# Patient Record
Sex: Male | Born: 1955
Health system: Southern US, Community
[De-identification: ages and names within clinical notes are randomized; demographics above are authoritative.]

## PROBLEM LIST (undated history)

## (undated) DIAGNOSIS — I080 Rheumatic disorders of both mitral and aortic valves: Secondary | ICD-10-CM

## (undated) DIAGNOSIS — M1990 Unspecified osteoarthritis, unspecified site: Secondary | ICD-10-CM

## (undated) DIAGNOSIS — K2970 Gastritis, unspecified, without bleeding: Secondary | ICD-10-CM

## (undated) DIAGNOSIS — I350 Nonrheumatic aortic (valve) stenosis: Secondary | ICD-10-CM

## (undated) DIAGNOSIS — I739 Peripheral vascular disease, unspecified: Secondary | ICD-10-CM

## (undated) DIAGNOSIS — T8189XA Other complications of procedures, not elsewhere classified, initial encounter: Secondary | ICD-10-CM

## (undated) DIAGNOSIS — T7840XA Allergy, unspecified, initial encounter: Secondary | ICD-10-CM

## (undated) DIAGNOSIS — G4730 Sleep apnea, unspecified: Secondary | ICD-10-CM

## (undated) DIAGNOSIS — I639 Cerebral infarction, unspecified: Secondary | ICD-10-CM

## (undated) DIAGNOSIS — F17200 Nicotine dependence, unspecified, uncomplicated: Secondary | ICD-10-CM

## (undated) DIAGNOSIS — K2980 Duodenitis without bleeding: Secondary | ICD-10-CM

## (undated) DIAGNOSIS — L409 Psoriasis, unspecified: Secondary | ICD-10-CM

## (undated) DIAGNOSIS — K219 Gastro-esophageal reflux disease without esophagitis: Secondary | ICD-10-CM

## (undated) DIAGNOSIS — F419 Anxiety disorder, unspecified: Secondary | ICD-10-CM

## (undated) DIAGNOSIS — N19 Unspecified kidney failure: Secondary | ICD-10-CM

## (undated) DIAGNOSIS — N289 Disorder of kidney and ureter, unspecified: Secondary | ICD-10-CM

## (undated) DIAGNOSIS — I5030 Unspecified diastolic (congestive) heart failure: Secondary | ICD-10-CM

## (undated) DIAGNOSIS — G629 Polyneuropathy, unspecified: Secondary | ICD-10-CM

## (undated) DIAGNOSIS — R0602 Shortness of breath: Secondary | ICD-10-CM

## (undated) DIAGNOSIS — J45909 Unspecified asthma, uncomplicated: Secondary | ICD-10-CM

## (undated) DIAGNOSIS — F32A Depression, unspecified: Secondary | ICD-10-CM

## (undated) DIAGNOSIS — H269 Unspecified cataract: Secondary | ICD-10-CM

## (undated) DIAGNOSIS — D649 Anemia, unspecified: Secondary | ICD-10-CM

## (undated) DIAGNOSIS — L4050 Arthropathic psoriasis, unspecified: Secondary | ICD-10-CM

## (undated) DIAGNOSIS — D369 Benign neoplasm, unspecified site: Secondary | ICD-10-CM

## (undated) DIAGNOSIS — I2510 Atherosclerotic heart disease of native coronary artery without angina pectoris: Secondary | ICD-10-CM

## (undated) DIAGNOSIS — R011 Cardiac murmur, unspecified: Secondary | ICD-10-CM

## (undated) DIAGNOSIS — E669 Obesity, unspecified: Secondary | ICD-10-CM

## (undated) DIAGNOSIS — B9681 Helicobacter pylori [H. pylori] as the cause of diseases classified elsewhere: Secondary | ICD-10-CM

## (undated) DIAGNOSIS — D126 Benign neoplasm of colon, unspecified: Secondary | ICD-10-CM

## (undated) DIAGNOSIS — N186 End stage renal disease: Secondary | ICD-10-CM

## (undated) DIAGNOSIS — R3912 Poor urinary stream: Secondary | ICD-10-CM

## (undated) DIAGNOSIS — R0600 Dyspnea, unspecified: Secondary | ICD-10-CM

## (undated) DIAGNOSIS — N184 Chronic kidney disease, stage 4 (severe): Secondary | ICD-10-CM

## (undated) DIAGNOSIS — I6529 Occlusion and stenosis of unspecified carotid artery: Secondary | ICD-10-CM

## (undated) DIAGNOSIS — F329 Major depressive disorder, single episode, unspecified: Secondary | ICD-10-CM

## (undated) DIAGNOSIS — I10 Essential (primary) hypertension: Secondary | ICD-10-CM

## (undated) DIAGNOSIS — Z951 Presence of aortocoronary bypass graft: Secondary | ICD-10-CM

## (undated) DIAGNOSIS — K319 Disease of stomach and duodenum, unspecified: Secondary | ICD-10-CM

## (undated) DIAGNOSIS — K5790 Diverticulosis of intestine, part unspecified, without perforation or abscess without bleeding: Secondary | ICD-10-CM

## (undated) DIAGNOSIS — I82409 Acute embolism and thrombosis of unspecified deep veins of unspecified lower extremity: Secondary | ICD-10-CM

## (undated) DIAGNOSIS — E785 Hyperlipidemia, unspecified: Secondary | ICD-10-CM

## (undated) DIAGNOSIS — I208 Other forms of angina pectoris: Secondary | ICD-10-CM

## (undated) DIAGNOSIS — G510 Bell's palsy: Secondary | ICD-10-CM

## (undated) HISTORY — DX: Unspecified cataract: H26.9

## (undated) HISTORY — PX: VASCULAR SURGERY: SHX849

## (undated) HISTORY — DX: Shortness of breath: R06.02

## (undated) HISTORY — DX: Cerebral infarction, unspecified: I63.9

## (undated) HISTORY — DX: Diverticulosis of intestine, part unspecified, without perforation or abscess without bleeding: K57.90

## (undated) HISTORY — PX: CORONARY ANGIOPLASTY: SHX604

## (undated) HISTORY — DX: Other forms of angina pectoris: I20.8

## (undated) HISTORY — DX: Hyperlipidemia, unspecified: E78.5

## (undated) HISTORY — DX: Obesity, unspecified: E66.9

## (undated) HISTORY — DX: Atherosclerotic heart disease of native coronary artery without angina pectoris: I25.10

## (undated) HISTORY — DX: Bell's palsy: G51.0

## (undated) HISTORY — DX: Depression, unspecified: F32.A

## (undated) HISTORY — DX: Polyneuropathy, unspecified: G62.9

## (undated) HISTORY — DX: Other complications of procedures, not elsewhere classified, initial encounter: T81.89XA

## (undated) HISTORY — DX: Cardiac murmur, unspecified: R01.1

## (undated) HISTORY — DX: Essential (primary) hypertension: I10

## (undated) HISTORY — DX: Occlusion and stenosis of unspecified carotid artery: I65.29

## (undated) HISTORY — DX: Nicotine dependence, unspecified, uncomplicated: F17.200

## (undated) HISTORY — DX: Disease of stomach and duodenum, unspecified: K31.9

## (undated) HISTORY — DX: Poor urinary stream: R39.12

## (undated) HISTORY — DX: Gastritis, unspecified, without bleeding: K29.70

## (undated) HISTORY — DX: Unspecified osteoarthritis, unspecified site: M19.90

## (undated) HISTORY — DX: Benign neoplasm of colon, unspecified: D12.6

## (undated) HISTORY — DX: Psoriasis, unspecified: L40.9

## (undated) HISTORY — DX: Major depressive disorder, single episode, unspecified: F32.9

## (undated) HISTORY — DX: Nonrheumatic aortic (valve) stenosis: I35.0

## (undated) HISTORY — DX: Arthropathic psoriasis, unspecified: L40.50

## (undated) HISTORY — PX: SPLENECTOMY: SUR1306

## (undated) HISTORY — DX: Helicobacter pylori (H. pylori) as the cause of diseases classified elsewhere: B96.81

## (undated) HISTORY — DX: Acute embolism and thrombosis of unspecified deep veins of unspecified lower extremity: I82.409

## (undated) HISTORY — DX: Peripheral vascular disease, unspecified: I73.9

## (undated) HISTORY — DX: Anxiety disorder, unspecified: F41.9

## (undated) HISTORY — DX: Allergy, unspecified, initial encounter: T78.40XA

## (undated) HISTORY — DX: Duodenitis without bleeding: K29.80

## (undated) HISTORY — DX: Benign neoplasm, unspecified site: D36.9

## (undated) HISTORY — DX: Sleep apnea, unspecified: G47.30

## (undated) HISTORY — PX: COLONOSCOPY: SHX174

---

## 2005-07-23 DIAGNOSIS — G510 Bell's palsy: Secondary | ICD-10-CM

## 2005-07-23 HISTORY — DX: Bell's palsy: G51.0

## 2009-04-14 ENCOUNTER — Emergency Department: Payer: Self-pay | Admitting: Emergency Medicine

## 2009-04-19 ENCOUNTER — Ambulatory Visit: Payer: Self-pay | Admitting: Pain Medicine

## 2009-04-27 ENCOUNTER — Ambulatory Visit: Payer: Self-pay | Admitting: Pain Medicine

## 2009-05-30 ENCOUNTER — Ambulatory Visit: Payer: Self-pay | Admitting: Gastroenterology

## 2009-06-02 ENCOUNTER — Ambulatory Visit: Payer: Self-pay | Admitting: Pain Medicine

## 2009-06-08 ENCOUNTER — Ambulatory Visit: Payer: Self-pay | Admitting: Pain Medicine

## 2009-07-07 ENCOUNTER — Inpatient Hospital Stay: Payer: Self-pay | Admitting: Vascular Surgery

## 2009-07-23 HISTORY — PX: OTHER SURGICAL HISTORY: SHX169

## 2009-08-16 ENCOUNTER — Inpatient Hospital Stay: Payer: Self-pay | Admitting: Cardiovascular Disease

## 2009-09-15 ENCOUNTER — Encounter: Payer: Self-pay | Admitting: Cardiovascular Disease

## 2009-09-20 ENCOUNTER — Encounter: Payer: Self-pay | Admitting: Cardiovascular Disease

## 2009-10-21 ENCOUNTER — Encounter: Payer: Self-pay | Admitting: Cardiovascular Disease

## 2010-01-11 ENCOUNTER — Emergency Department: Payer: Self-pay | Admitting: Emergency Medicine

## 2010-02-28 ENCOUNTER — Emergency Department: Payer: Self-pay | Admitting: Emergency Medicine

## 2010-03-28 ENCOUNTER — Emergency Department: Payer: Self-pay | Admitting: Emergency Medicine

## 2010-04-27 ENCOUNTER — Ambulatory Visit: Payer: Self-pay | Admitting: Vascular Surgery

## 2010-08-11 ENCOUNTER — Ambulatory Visit
Admission: RE | Admit: 2010-08-11 | Discharge: 2010-08-11 | Payer: Self-pay | Source: Home / Self Care | Attending: Cardiovascular Disease | Admitting: Cardiovascular Disease

## 2010-08-12 NOTE — Assessment & Plan Note (Signed)
[facility] OFFICE NOTE  NAME:[name], [name]                       MRN:          [id number] DATE:08/11/2010                            DOB:          1956-06-07   PRIMARY CARE PHYSICIAN:  Dr. Ronette Deter in [place].  CHIEF COMPLAINT:  Generalized fatigue and dyspnea with occasional chest pain.  HISTORY OF PRESENT ILLNESS:  [name] is a 55 year old gentleman who is one of my patients from my previous practice.  He has extensive medical problems that include, 1. Coronary artery disease.  Cardiac catheterization was done in     January 2011 through the left femoral artery.  It showed 95%     diffuse calcified proximal LAD disease, 50% circumflex disease, and     60% ostial RCA stenosis.  Ejection fraction was normal.  He     underwent angioplasty and 3 overlapping drug-eluting stent     placements to the proximal LAD with 2.5 x 18 mm, 2.75 x 18 mm, and     a 3.0 x 18 mm Xience drug-eluting stents. 2. Peripheral vascular disease status post multiple percutaneous     intervention on the iliac, SFA as well as below the knee done by     Dr. Ronalee Belts. 3. Type 2 diabetes. 4. Mild aortic stenosis. 5. Hyperlipidemia. 6. Hypertension. 7. Tobacco use, recently quit. 8. Depression. 9. Psoriasis. 10.Neuropathy. 11.Obesity. 12.Status post splenectomy.  [name] is here today for a followup visit.  Overall, he has been feeling very tired with no energy.  He is not able to do any regular exercise due to fatigue and also discomfort in his lower extremities as well as claudication.  He continues to have occasional chest tightness most of the time with certain activities but has not had any in the last few weeks.  He gets dyspnea with minimal activities and has not been able to exercise regularly.  MEDICATIONS: 1. Aspirin 325 mg once daily. 2. Cymbalta 30 mg once daily. 3. Diazepam 2 mg three times  daily. 4. Fentanyl patch 50 mcg every 3 days. 5. Insulin Lantus 50 units once daily. 6. Lipitor 40 mg once daily. 7. Metoprolol tartrate 25 mg twice daily. 8. Omega three fish oil once daily. 9. Oxycodone twice daily. 10.Plavix 75 mg once daily. 11.Pristiq 50 mg once daily. 12.Vitamin D3.  ALLERGIES:  PENICILLIN.  SOCIAL HISTORY:  He quit smoking 2 weeks ago.  He used to smoke half a pack to one pack a day for 30 years.  He denies any alcohol use.  There has been no history of recreational drug use except in the very remote past.  The patient is married and retired.  FAMILY HISTORY:  Negative for premature coronary artery disease.  PHYSICAL EXAMINATION:  GENERAL:  The patient appears to be at his stated age and in no acute distress. VITAL SIGNS:  Weight is 261 pounds, blood pressure is 147/89, pulse is 89, oxygen saturation is 98% on room air. HEENT:  Normocephalic, atraumatic. NECK:  No JVD or carotid bruits. RESPIRATORY:  Normal respiratory effort with no use of accessory muscles.  Auscultation  reveals normal breath sounds. CARDIOVASCULAR:  Normal PMI.  Normal S1 and S2 with no gallops.  There is a 2/6 systolic ejection murmur at the aortic area which is early peaking. ABDOMEN:  Benign, nontender, nondistended. EXTREMITIES:  No clubbing or cyanosis.  There is trace edema bilaterally. SKIN:  He has skin lesions on both arms and the elbow area consistent with psoriasis. PSYCHIATRIC:  He is alert, oriented x3 with normal mood and affect. MUSCULOSKELETAL:  He has normal muscle strength in the upper and lower extremities.  An electrocardiogram was performed which showed normal sinus rhythm with no significant ST or T-wave changes.  IMPRESSION: 1. Coronary artery disease.  He is having symptoms of generalized     fatigue and increased dyspnea with minimal activities.  He also has     occasional chest tightness with activities.  His cardiac     catheterization last year  showed moderate stenosis in the right     coronary artery as well as the left circumflex artery.  He did have     angioplasty and three drug-eluting stent placement to the proximal     and ostial LAD.  Due to his current symptoms and previous cardiac     history, I recommend proceeding with a Lexiscan nuclear stress test     to evaluate for possible underlying ischemia.  The patient is not     able to exercise on the treadmill due to lower extremity     claudication, neuropathy, and back pain.  In the meantime, I asked     him to decrease aspirin to 81 mg once daily.  We will continue with     Plavix 75 mg once daily, likely indefinitely if tolerated.  We will     continue with management of his risk factors. 2. Hypertension:  Blood pressure is mildly elevated.  We will consider     adding an ACE inhibitor given that he is diabetic. 3. Hyperlipidemia being treated with Lipitor and fish oil.  I do not     have the results of his recent lipid profile.  Target LDL should be     less than 70. 4. Peripheral arterial disease:  This is being followed and managed by     Dr. Ronalee Belts.  I discussed with the patient the importance of     complete smoking cessation and not going back to it.  He has not     smoked in the last 2 weeks and hopefully, he will be able to quit     completely.  The patient's followup will be decided based on the     results of the stress test.  If the stress test shows evidence of     ischemia, most likely, we will need to proceed with the cardiac     catheterization given his prior cardiac history.    [name], MD Electronically Signed   MA/MedQ  DD: 08/11/2010  DT: 08/12/2010  Job #: [id number]

## 2010-08-22 ENCOUNTER — Telehealth (INDEPENDENT_AMBULATORY_CARE_PROVIDER_SITE_OTHER): Payer: Self-pay | Admitting: *Deleted

## 2010-08-23 ENCOUNTER — Ambulatory Visit (HOSPITAL_COMMUNITY): Payer: Medicare Other | Attending: Cardiovascular Disease

## 2010-08-23 ENCOUNTER — Encounter: Payer: Self-pay | Admitting: Cardiovascular Disease

## 2010-08-23 ENCOUNTER — Ambulatory Visit: Admit: 2010-08-23 | Payer: Self-pay

## 2010-08-23 DIAGNOSIS — R0989 Other specified symptoms and signs involving the circulatory and respiratory systems: Secondary | ICD-10-CM

## 2010-08-23 DIAGNOSIS — R079 Chest pain, unspecified: Secondary | ICD-10-CM | POA: Insufficient documentation

## 2010-08-23 DIAGNOSIS — R0609 Other forms of dyspnea: Secondary | ICD-10-CM

## 2010-08-23 DIAGNOSIS — I2510 Atherosclerotic heart disease of native coronary artery without angina pectoris: Secondary | ICD-10-CM

## 2010-08-23 HISTORY — PX: CARDIAC CATHETERIZATION: SHX172

## 2010-08-23 HISTORY — PX: CAROTID ENDARTERECTOMY: SUR193

## 2010-08-28 ENCOUNTER — Telehealth (INDEPENDENT_AMBULATORY_CARE_PROVIDER_SITE_OTHER): Payer: Self-pay | Admitting: *Deleted

## 2010-08-30 ENCOUNTER — Ambulatory Visit (HOSPITAL_COMMUNITY)
Admission: RE | Admit: 2010-08-30 | Discharge: 2010-08-30 | Disposition: A | Discharge status: Home / Self Care | Payer: Medicare Other | Source: Ambulatory Visit | Attending: Cardiovascular Disease | Admitting: Cardiovascular Disease

## 2010-08-30 DIAGNOSIS — R0989 Other specified symptoms and signs involving the circulatory and respiratory systems: Secondary | ICD-10-CM | POA: Insufficient documentation

## 2010-08-30 DIAGNOSIS — Z9861 Coronary angioplasty status: Secondary | ICD-10-CM | POA: Insufficient documentation

## 2010-08-30 DIAGNOSIS — E119 Type 2 diabetes mellitus without complications: Secondary | ICD-10-CM | POA: Insufficient documentation

## 2010-08-30 DIAGNOSIS — I2510 Atherosclerotic heart disease of native coronary artery without angina pectoris: Secondary | ICD-10-CM | POA: Insufficient documentation

## 2010-08-30 DIAGNOSIS — E785 Hyperlipidemia, unspecified: Secondary | ICD-10-CM | POA: Insufficient documentation

## 2010-08-30 DIAGNOSIS — R5383 Other fatigue: Secondary | ICD-10-CM | POA: Insufficient documentation

## 2010-08-30 DIAGNOSIS — R5381 Other malaise: Secondary | ICD-10-CM | POA: Insufficient documentation

## 2010-08-30 DIAGNOSIS — R0609 Other forms of dyspnea: Secondary | ICD-10-CM | POA: Insufficient documentation

## 2010-08-30 DIAGNOSIS — R079 Chest pain, unspecified: Secondary | ICD-10-CM | POA: Insufficient documentation

## 2010-08-30 DIAGNOSIS — I10 Essential (primary) hypertension: Secondary | ICD-10-CM | POA: Insufficient documentation

## 2010-08-30 DIAGNOSIS — Y849 Medical procedure, unspecified as the cause of abnormal reaction of the patient, or of later complication, without mention of misadventure at the time of the procedure: Secondary | ICD-10-CM | POA: Insufficient documentation

## 2010-08-30 DIAGNOSIS — Z7902 Long term (current) use of antithrombotics/antiplatelets: Secondary | ICD-10-CM | POA: Insufficient documentation

## 2010-08-30 LAB — PROTIME-INR
INR: 0.98 (ref 0.00–1.49)
Prothrombin Time: 13.2 seconds (ref 11.6–15.2)

## 2010-08-30 LAB — CBC
HCT: 41 % (ref 39.0–52.0)
Hemoglobin: 14 g/dL (ref 13.0–17.0)
MCH: 30.4 pg (ref 26.0–34.0)
MCHC: 34.1 g/dL (ref 30.0–36.0)
MCV: 88.9 fL (ref 78.0–100.0)
Platelets: 203 10*3/uL (ref 150–400)
RBC: 4.61 MIL/uL (ref 4.22–5.81)
RDW: 15.8 % — ABNORMAL HIGH (ref 11.5–15.5)
WBC: 9.7 10*3/uL (ref 4.0–10.5)

## 2010-08-30 LAB — BASIC METABOLIC PANEL
BUN: 18 mg/dL (ref 6–23)
CO2: 27 mEq/L (ref 19–32)
Calcium: 9.3 mg/dL (ref 8.4–10.5)
Chloride: 102 mEq/L (ref 96–112)
Creatinine, Ser: 1.27 mg/dL (ref 0.4–1.5)
GFR calc Af Amer: >60 mL/min (ref >60)
GFR calc non Af Amer: 59 mL/min — ABNORMAL LOW (ref >60)
Glucose, Bld: 150 mg/dL — ABNORMAL HIGH (ref 70–99)
Potassium: 4.3 mEq/L (ref 3.5–5.1)
Sodium: 140 mEq/L (ref 135–145)

## 2010-08-30 LAB — GLUCOSE, CAPILLARY
Glucose-Capillary: 137 mg/dL — ABNORMAL HIGH (ref 70–99)
Glucose-Capillary: 128 mg/dL — ABNORMAL HIGH (ref 70–99)

## 2010-08-30 NOTE — Assessment & Plan Note (Addendum)
Summary: Cardiology Nuclear Testing  Nuclear Med Background Indications for Stress Test: Evaluation for Ischemia   History: Angioplasty, GXT, Heart Catheterization, Myocardial Perfusion Study, Stents  History Comments: 1/11 Cath with PTCA /stent of LAD with residural CAD of RCA and LCX Mild AS MPS- in [place]  Symptoms: Chest Pain, Chest Tightness, Chest Tightness with Exertion, DOE, Fatigue  Symptoms Comments: Last episode of CP > 30 days   Nuclear Pre-Procedure Cardiac Risk Factors: Claudication, History of Smoking, Hypertension, IDDM Type 2, Lipids, PVD Caffeine/Decaff Intake: NONE NPO After: 8:00 PM Lungs: clear IV 0.9% NS with Angio Cath: 22g     IV Site: R Hand IV Started by: Crissie Figures, RN Chest Size (in) 50     Height (in): 65 Weight (lb): 252 BMI: 42.09  Nuclear Med Study 1 or 2 day study:  1 day     Stress Test Type:  Lexiscan Reading MD:  Jenkins Rouge, MD     Referring MD:  Dr. Rod Can Resting Radionuclide:  Technetium 60m Tetrofosmin     Resting Radionuclide Dose:  11.0 mCi  Stress Radionuclide:  Technetium 9m Tetrofosmin     Stress Radionuclide Dose:  33.0 mCi   Stress Protocol      Max HR:  123XX123 bpm Max Systolic BP: 123456 mm HgRate Pressure Product:  [id number]  Lexiscan: 0.4 mg   Stress Test Technologist:  Crissie Figures, RN     Nuclear Technologist:  Annye Rusk, CNMT  Rest Procedure  Myocardial perfusion imaging was performed at rest 45 minutes following the intravenous administration of Technetium 46m Tetrofosmin.  Stress Procedure  The patient received IV Lexiscan 0.4 mg over 15-seconds.  Technetium 29m Tetrofosmin injected at 30-seconds.  There were no significant changes with infusion.  Quantitative spect images were obtained after a 45 minute delay.  QPS Raw Data Images:  Normal; no motion artifact; normal heart/lung ratio. Stress Images:  Normal homogeneous uptake in all areas of the myocardium. Rest Images:  Normal homogeneous uptake in all areas  of the myocardium. Subtraction (SDS):  Normal Transient Ischemic Dilatation:  1.00  (Normal <1.22)  Lung/Heart Ratio:  0.30  (Normal <0.45)  Quantitative Gated Spect Images QGS EDV:  85 ml QGS ESV:  51 ml QGS EF:  40 % QGS cine images:  Markedly dysynergy between lateral wall and septum   Findings Low risk nuclear study   Evidence for LV Dysfunction LV Dysfunction    Overall Impression  Exercise Capacity: Lexiscan with no exercise. BP Response: Normal blood pressure response. Clinical Symptoms: Dizzy ECG Impression: No significant ST segment change suggestive of ischemia. Overall Impression: No ischemia or infarct.  Decreased EF with dysynergy between lateral wall and septum

## 2010-08-30 NOTE — Progress Notes (Signed)
Summary: nuc pre procedure  Phone Note Outgoing Call Call back at Home Phone [phone]   Call placed by: Matilde Haymaker RN,  August 22, 2010 9:57 AM Call placed to: Patient Reason for Call: Confirm/change Appt     Nuclear Med Background Indications for Stress Test: Evaluation for Ischemia   History: Angioplasty, GXT, Heart Catheterization, Stents  History Comments: 1/11 Cath with PTCA /stent of LAD with residural CAD of RCA and LCX Mild AS  Symptoms: Chest Pain, Chest Tightness, Chest Tightness with Exertion, DOE, Fatigue    Nuclear Pre-Procedure Cardiac Risk Factors: Claudication, History of Smoking, Hypertension, IDDM Type 2, Lipids, PVD

## 2010-08-31 ENCOUNTER — Encounter (INDEPENDENT_AMBULATORY_CARE_PROVIDER_SITE_OTHER): Payer: Medicare Other | Admitting: Thoracic Surgery (Cardiothoracic Vascular Surgery)

## 2010-08-31 DIAGNOSIS — I2510 Atherosclerotic heart disease of native coronary artery without angina pectoris: Secondary | ICD-10-CM

## 2010-09-04 ENCOUNTER — Other Ambulatory Visit: Payer: Self-pay | Admitting: Thoracic Surgery (Cardiothoracic Vascular Surgery)

## 2010-09-04 ENCOUNTER — Ambulatory Visit (HOSPITAL_COMMUNITY)
Admission: RE | Admit: 2010-09-04 | Discharge: 2010-09-04 | Disposition: A | Discharge status: Home / Self Care | Payer: Medicare Other | Source: Ambulatory Visit | Attending: Thoracic Surgery (Cardiothoracic Vascular Surgery) | Admitting: Thoracic Surgery (Cardiothoracic Vascular Surgery)

## 2010-09-04 ENCOUNTER — Encounter (HOSPITAL_COMMUNITY)
Admission: RE | Admit: 2010-09-04 | Discharge: 2010-09-04 | Disposition: A | Discharge status: Home / Self Care | Payer: Medicare Other | Source: Ambulatory Visit | Attending: Thoracic Surgery (Cardiothoracic Vascular Surgery) | Admitting: Thoracic Surgery (Cardiothoracic Vascular Surgery)

## 2010-09-04 ENCOUNTER — Ambulatory Visit (HOSPITAL_COMMUNITY): Payer: Medicare Other | Attending: Thoracic Surgery (Cardiothoracic Vascular Surgery)

## 2010-09-04 DIAGNOSIS — Z01818 Encounter for other preprocedural examination: Secondary | ICD-10-CM | POA: Insufficient documentation

## 2010-09-04 DIAGNOSIS — I2510 Atherosclerotic heart disease of native coronary artery without angina pectoris: Secondary | ICD-10-CM

## 2010-09-04 DIAGNOSIS — I10 Essential (primary) hypertension: Secondary | ICD-10-CM | POA: Insufficient documentation

## 2010-09-04 DIAGNOSIS — Z01810 Encounter for preprocedural cardiovascular examination: Secondary | ICD-10-CM

## 2010-09-04 LAB — URINE MICROSCOPIC-ADD ON

## 2010-09-04 LAB — PROTIME-INR
INR: 1.01 (ref 0.00–1.49)
Prothrombin Time: 13.5 seconds (ref 11.6–15.2)

## 2010-09-04 LAB — BLOOD GAS, ARTERIAL
Acid-base deficit: 0.2 mmol/L (ref 0.0–2.0)
Bicarbonate: 23.8 mEq/L (ref 20.0–24.0)
Drawn by: 206361
FIO2: 0.21 %
O2 Saturation: 97 %
Patient temperature: 98.6
TCO2: 25 mmol/L (ref 0–100)
pCO2 arterial: 37.8 mmHg (ref 35.0–45.0)
pH, Arterial: 7.415 (ref 7.350–7.450)
pO2, Arterial: 85.9 mmHg (ref 80.0–100.0)

## 2010-09-04 LAB — CBC
HCT: 39.1 % (ref 39.0–52.0)
Hemoglobin: 13.1 g/dL (ref 13.0–17.0)
MCH: 29.3 pg (ref 26.0–34.0)
MCHC: 33.5 g/dL (ref 30.0–36.0)
MCV: 87.5 fL (ref 78.0–100.0)
Platelets: 208 10*3/uL (ref 150–400)
RBC: 4.47 MIL/uL (ref 4.22–5.81)
RDW: 15.5 % (ref 11.5–15.5)
WBC: 9.3 10*3/uL (ref 4.0–10.5)

## 2010-09-04 LAB — URINALYSIS, ROUTINE W REFLEX MICROSCOPIC
Bilirubin Urine: NEGATIVE
Hgb urine dipstick: NEGATIVE
Ketones, ur: NEGATIVE mg/dL
Leukocytes, UA: NEGATIVE
Nitrite: NEGATIVE
Protein, ur: 30 mg/dL — AB
Specific Gravity, Urine: 1.015 (ref 1.005–1.030)
Urine Glucose, Fasting: NEGATIVE mg/dL
Urobilinogen, UA: 0.2 mg/dL (ref 0.0–1.0)
pH: 5.5 (ref 5.0–8.0)

## 2010-09-04 LAB — HEMOGLOBIN A1C
Hgb A1c MFr Bld: 9.2 % — ABNORMAL HIGH (ref <5.7)
Mean Plasma Glucose: 217 mg/dL — ABNORMAL HIGH (ref <117)

## 2010-09-04 LAB — TYPE AND SCREEN
ABO/RH(D): AB POS
Antibody Screen: NEGATIVE

## 2010-09-04 LAB — COMPREHENSIVE METABOLIC PANEL
ALT: 24 U/L (ref 0–53)
AST: 19 U/L (ref 0–37)
Albumin: 3.5 g/dL (ref 3.5–5.2)
Alkaline Phosphatase: 140 U/L — ABNORMAL HIGH (ref 39–117)
BUN: 15 mg/dL (ref 6–23)
CO2: 21 mEq/L (ref 19–32)
Calcium: 8.9 mg/dL (ref 8.4–10.5)
Chloride: 107 mEq/L (ref 96–112)
Creatinine, Ser: 1.08 mg/dL (ref 0.4–1.5)
GFR calc Af Amer: >60 mL/min (ref >60)
GFR calc non Af Amer: >60 mL/min (ref >60)
Glucose, Bld: 167 mg/dL — ABNORMAL HIGH (ref 70–99)
Potassium: 4.3 mEq/L (ref 3.5–5.1)
Sodium: 139 mEq/L (ref 135–145)
Total Bilirubin: 0.5 mg/dL (ref 0.3–1.2)
Total Protein: 6.8 g/dL (ref 6.0–8.3)

## 2010-09-04 LAB — ABO/RH: ABO/RH(D): AB POS

## 2010-09-04 LAB — SURGICAL PCR SCREEN
MRSA, PCR: NEGATIVE
Staphylococcus aureus: POSITIVE — AB

## 2010-09-04 LAB — APTT: aPTT: 29 seconds (ref 24–37)

## 2010-09-05 ENCOUNTER — Encounter: Payer: Medicare Other | Admitting: Vascular Surgery

## 2010-09-05 ENCOUNTER — Encounter (INDEPENDENT_AMBULATORY_CARE_PROVIDER_SITE_OTHER): Payer: Medicare Other | Admitting: Vascular Surgery

## 2010-09-05 DIAGNOSIS — I6529 Occlusion and stenosis of unspecified carotid artery: Secondary | ICD-10-CM

## 2010-09-05 NOTE — Consult Note (Signed)
NEW PATIENT CONSULTATION  [name] [name], SPITALE DOB:  Jun 12, 1956                                       09/05/2010 [id number]  The patient is a 55 year old male with diabetes mellitus who is scheduled for coronary artery bypass grafting tomorrow by Dr. Merilynn Finland.  In his preoperative evaluation, he underwent carotid duplex scanning which I have reviewed and this reveals an 89% left internal carotid stenosis and 40% right internal carotid stenosis.  The patient states that in the last 6 months he has had 2 episodes of garbled speech and right upper extremity weakness, each lasting less than 5 minutes.  He has never had a previous stroke but does have a history of Bell's palsy causing some left facial weakness.  He was referred for further evaluation.  CHRONIC MEDICAL PROBLEMS: 1. Diabetes mellitus type 2. 2. Hypertension. 3. Hyperlipidemia. 4. Sleep apnea, COPD, on CPAP. 5. Peripheral neuropathy. 6. History of Bell's palsy.  SOCIAL HISTORY:  He is married, has no children, is retired, has smoked at least a pack cigarettes per day for 40+ years, does not use alcohol.  FAMILY HISTORY:  Positive for coronary artery disease in his mother, diabetes mellitus in a grandfather.  Negative for stroke.  REVIEW OF SYSTEMS:  Positive for seizure, decreased visual acuity, discomfort in his legs with ambulation, peripheral neuropathy, productive cough, sleep apnea, arthritis, chest pain, dyspnea on exertion, heart murmur, depression, anxiety, attention deficit disorder, constipation.  All other systems in review of systems are negative.  PHYSICAL EXAM:  Blood pressure 120/76, heart rate 78, respirations 16. General:  He is an obese male who is in no apparent distress, alert and oriented x3.  HEENT:  Exam normal for age.  EOMs intact.  He does have left facial weakness.  Lungs:  Clear to auscultation with some bilateral rhonchi.  Cardiovascular:  Regular rhythm.   No murmurs.  Carotid pulses are 3+ with a soft bruit on the left.  Musculoskeletal:  Exam is free of major deformities.  Abdomen:  Obese.  No palpable masses.  Neurologic exam reveals left facial weakness consistent with Bell's palsy.  Skin: Free of rashes.  Lower extremity exam reveals 3+ femoral pulses bilaterally.  IMPRESSION: 1. Left brain transient ischemic attacks with severe left internal     carotid stenosis. 2. Coronary artery disease.  Scheduled for coronary artery bypass     grafting tomorrow by Dr. Merilynn Finland.  PLAN:  Perform combined left carotid endarterectomy and coronary artery bypass grafting because he has a severe stenosis on the left and it is symptomatic despite taking Plavix at the time the symptoms occurred most recently in September 2011.  The risks and benefits have been thoroughly discussed with this patient who would like to proceed.    Nelda Severe Kellie Simmering, M.D. Electronically Signed  JDL/MEDQ  D:  09/05/2010  T:  09/05/2010  Job:  [id number]  cc:   Revonda Standard. Roxan Hockey, M.D. Dr. Baron Hamper

## 2010-09-06 ENCOUNTER — Inpatient Hospital Stay (HOSPITAL_COMMUNITY): Payer: Medicare Other

## 2010-09-06 ENCOUNTER — Other Ambulatory Visit: Payer: Self-pay | Admitting: Vascular Surgery

## 2010-09-06 ENCOUNTER — Inpatient Hospital Stay (HOSPITAL_COMMUNITY)
Admission: RE | Admit: 2010-09-06 | Discharge: 2010-09-12 | DRG: 236 | Disposition: A | Discharge status: Home Health | Payer: Medicare Other | Source: Ambulatory Visit | Attending: Thoracic Surgery (Cardiothoracic Vascular Surgery) | Admitting: Thoracic Surgery (Cardiothoracic Vascular Surgery)

## 2010-09-06 DIAGNOSIS — I6529 Occlusion and stenosis of unspecified carotid artery: Secondary | ICD-10-CM | POA: Diagnosis present

## 2010-09-06 DIAGNOSIS — I519 Heart disease, unspecified: Secondary | ICD-10-CM | POA: Diagnosis not present

## 2010-09-06 DIAGNOSIS — Z8673 Personal history of transient ischemic attack (TIA), and cerebral infarction without residual deficits: Secondary | ICD-10-CM

## 2010-09-06 DIAGNOSIS — Y9210 Unspecified residential institution as the place of occurrence of the external cause: Secondary | ICD-10-CM | POA: Diagnosis not present

## 2010-09-06 DIAGNOSIS — E119 Type 2 diabetes mellitus without complications: Secondary | ICD-10-CM | POA: Diagnosis present

## 2010-09-06 DIAGNOSIS — G609 Hereditary and idiopathic neuropathy, unspecified: Secondary | ICD-10-CM | POA: Diagnosis present

## 2010-09-06 DIAGNOSIS — I080 Rheumatic disorders of both mitral and aortic valves: Secondary | ICD-10-CM | POA: Diagnosis present

## 2010-09-06 DIAGNOSIS — K219 Gastro-esophageal reflux disease without esophagitis: Secondary | ICD-10-CM | POA: Diagnosis present

## 2010-09-06 DIAGNOSIS — E8779 Other fluid overload: Secondary | ICD-10-CM | POA: Diagnosis not present

## 2010-09-06 DIAGNOSIS — E669 Obesity, unspecified: Secondary | ICD-10-CM | POA: Diagnosis present

## 2010-09-06 DIAGNOSIS — I4891 Unspecified atrial fibrillation: Secondary | ICD-10-CM | POA: Diagnosis not present

## 2010-09-06 DIAGNOSIS — I2510 Atherosclerotic heart disease of native coronary artery without angina pectoris: Principal | ICD-10-CM | POA: Diagnosis present

## 2010-09-06 DIAGNOSIS — I10 Essential (primary) hypertension: Secondary | ICD-10-CM | POA: Diagnosis present

## 2010-09-06 DIAGNOSIS — E785 Hyperlipidemia, unspecified: Secondary | ICD-10-CM | POA: Diagnosis present

## 2010-09-06 DIAGNOSIS — F329 Major depressive disorder, single episode, unspecified: Secondary | ICD-10-CM | POA: Diagnosis present

## 2010-09-06 DIAGNOSIS — Z880 Allergy status to penicillin: Secondary | ICD-10-CM

## 2010-09-06 DIAGNOSIS — D62 Acute posthemorrhagic anemia: Secondary | ICD-10-CM | POA: Diagnosis not present

## 2010-09-06 DIAGNOSIS — L408 Other psoriasis: Secondary | ICD-10-CM | POA: Diagnosis present

## 2010-09-06 DIAGNOSIS — F17200 Nicotine dependence, unspecified, uncomplicated: Secondary | ICD-10-CM | POA: Diagnosis present

## 2010-09-06 DIAGNOSIS — Y840 Cardiac catheterization as the cause of abnormal reaction of the patient, or of later complication, without mention of misadventure at the time of the procedure: Secondary | ICD-10-CM | POA: Diagnosis present

## 2010-09-06 DIAGNOSIS — Z7982 Long term (current) use of aspirin: Secondary | ICD-10-CM

## 2010-09-06 DIAGNOSIS — Z9861 Coronary angioplasty status: Secondary | ICD-10-CM

## 2010-09-06 DIAGNOSIS — E876 Hypokalemia: Secondary | ICD-10-CM | POA: Diagnosis not present

## 2010-09-06 DIAGNOSIS — G4733 Obstructive sleep apnea (adult) (pediatric): Secondary | ICD-10-CM | POA: Diagnosis present

## 2010-09-06 DIAGNOSIS — Y92009 Unspecified place in unspecified non-institutional (private) residence as the place of occurrence of the external cause: Secondary | ICD-10-CM

## 2010-09-06 DIAGNOSIS — F3289 Other specified depressive episodes: Secondary | ICD-10-CM | POA: Diagnosis present

## 2010-09-06 DIAGNOSIS — G510 Bell's palsy: Secondary | ICD-10-CM | POA: Diagnosis present

## 2010-09-06 DIAGNOSIS — F05 Delirium due to known physiological condition: Secondary | ICD-10-CM | POA: Diagnosis not present

## 2010-09-06 DIAGNOSIS — Z794 Long term (current) use of insulin: Secondary | ICD-10-CM

## 2010-09-06 DIAGNOSIS — Y832 Surgical operation with anastomosis, bypass or graft as the cause of abnormal reaction of the patient, or of later complication, without mention of misadventure at the time of the procedure: Secondary | ICD-10-CM | POA: Diagnosis not present

## 2010-09-06 DIAGNOSIS — T82897A Other specified complication of cardiac prosthetic devices, implants and grafts, initial encounter: Secondary | ICD-10-CM | POA: Diagnosis present

## 2010-09-06 HISTORY — PX: CORONARY ARTERY BYPASS GRAFT: SHX141

## 2010-09-06 LAB — POCT I-STAT 4, (NA,K, GLUC, HGB,HCT)
Glucose, Bld: 204 mg/dL — ABNORMAL HIGH (ref 70–99)
Glucose, Bld: 135 mg/dL — ABNORMAL HIGH (ref 70–99)
Glucose, Bld: 160 mg/dL — ABNORMAL HIGH (ref 70–99)
Glucose, Bld: 117 mg/dL — ABNORMAL HIGH (ref 70–99)
Glucose, Bld: 141 mg/dL — ABNORMAL HIGH (ref 70–99)
Glucose, Bld: 151 mg/dL — ABNORMAL HIGH (ref 70–99)
HCT: 36 % — ABNORMAL LOW (ref 39.0–52.0)
HCT: 27 % — ABNORMAL LOW (ref 39.0–52.0)
HCT: 36 % — ABNORMAL LOW (ref 39.0–52.0)
HCT: 26 % — ABNORMAL LOW (ref 39.0–52.0)
HCT: 28 % — ABNORMAL LOW (ref 39.0–52.0)
HCT: 37 % — ABNORMAL LOW (ref 39.0–52.0)
Hemoglobin: 12.2 g/dL — ABNORMAL LOW (ref 13.0–17.0)
Hemoglobin: 12.2 g/dL — ABNORMAL LOW (ref 13.0–17.0)
Hemoglobin: 9.2 g/dL — ABNORMAL LOW (ref 13.0–17.0)
Hemoglobin: 8.8 g/dL — ABNORMAL LOW (ref 13.0–17.0)
Hemoglobin: 9.5 g/dL — ABNORMAL LOW (ref 13.0–17.0)
Hemoglobin: 12.6 g/dL — ABNORMAL LOW (ref 13.0–17.0)
Potassium: 4.6 mEq/L (ref 3.5–5.1)
Potassium: 5.1 mEq/L (ref 3.5–5.1)
Potassium: 5.5 mEq/L — ABNORMAL HIGH (ref 3.5–5.1)
Potassium: 5.9 mEq/L — ABNORMAL HIGH (ref 3.5–5.1)
Potassium: 5.8 mEq/L — ABNORMAL HIGH (ref 3.5–5.1)
Potassium: 5.3 mEq/L — ABNORMAL HIGH (ref 3.5–5.1)
Sodium: 138 mEq/L (ref 135–145)
Sodium: 138 mEq/L (ref 135–145)
Sodium: 139 mEq/L (ref 135–145)
Sodium: 135 mEq/L (ref 135–145)
Sodium: 132 mEq/L — ABNORMAL LOW (ref 135–145)
Sodium: 137 mEq/L (ref 135–145)

## 2010-09-06 LAB — POCT I-STAT 3, ART BLOOD GAS (G3+)
Acid-base deficit: 3 mmol/L — ABNORMAL HIGH (ref 0.0–2.0)
Acid-base deficit: 6 mmol/L — ABNORMAL HIGH (ref 0.0–2.0)
Acid-base deficit: 6 mmol/L — ABNORMAL HIGH (ref 0.0–2.0)
Acid-base deficit: 5 mmol/L — ABNORMAL HIGH (ref 0.0–2.0)
Bicarbonate: 22.8 mEq/L (ref 20.0–24.0)
Bicarbonate: 21.4 mEq/L (ref 20.0–24.0)
Bicarbonate: 20.8 mEq/L (ref 20.0–24.0)
Bicarbonate: 20.7 mEq/L (ref 20.0–24.0)
O2 Saturation: 100 %
O2 Saturation: 99 %
O2 Saturation: 89 %
O2 Saturation: 93 %
Patient temperature: 36.3
Patient temperature: 37.2
TCO2: 24 mmol/L (ref 0–100)
TCO2: 23 mmol/L (ref 0–100)
TCO2: 22 mmol/L (ref 0–100)
TCO2: 22 mmol/L (ref 0–100)
pCO2 arterial: 41.8 mmHg (ref 35.0–45.0)
pCO2 arterial: 50.3 mmHg — ABNORMAL HIGH (ref 35.0–45.0)
pCO2 arterial: 46.1 mmHg — ABNORMAL HIGH (ref 35.0–45.0)
pCO2 arterial: 41.1 mmHg (ref 35.0–45.0)
pH, Arterial: 7.345 — ABNORMAL LOW (ref 7.350–7.450)
pH, Arterial: 7.238 — ABNORMAL LOW (ref 7.350–7.450)
pH, Arterial: 7.26 — ABNORMAL LOW (ref 7.350–7.450)
pH, Arterial: 7.312 — ABNORMAL LOW (ref 7.350–7.450)
pO2, Arterial: 246 mmHg — ABNORMAL HIGH (ref 80.0–100.0)
pO2, Arterial: 140 mmHg — ABNORMAL HIGH (ref 80.0–100.0)
pO2, Arterial: 62 mmHg — ABNORMAL LOW (ref 80.0–100.0)
pO2, Arterial: 73 mmHg — ABNORMAL LOW (ref 80.0–100.0)

## 2010-09-06 LAB — CBC
HCT: 33.1 % — ABNORMAL LOW (ref 39.0–52.0)
HCT: 32.6 % — ABNORMAL LOW (ref 39.0–52.0)
Hemoglobin: 11 g/dL — ABNORMAL LOW (ref 13.0–17.0)
Hemoglobin: 10.9 g/dL — ABNORMAL LOW (ref 13.0–17.0)
MCH: 29.5 pg (ref 26.0–34.0)
MCH: 29.6 pg (ref 26.0–34.0)
MCHC: 33.2 g/dL (ref 30.0–36.0)
MCHC: 33.4 g/dL (ref 30.0–36.0)
MCV: 88.7 fL (ref 78.0–100.0)
MCV: 88.6 fL (ref 78.0–100.0)
Platelets: 215 10*3/uL (ref 150–400)
Platelets: 196 10*3/uL (ref 150–400)
RBC: 3.73 MIL/uL — ABNORMAL LOW (ref 4.22–5.81)
RBC: 3.68 MIL/uL — ABNORMAL LOW (ref 4.22–5.81)
RDW: 15.8 % — ABNORMAL HIGH (ref 11.5–15.5)
RDW: 15.7 % — ABNORMAL HIGH (ref 11.5–15.5)
WBC: 20.1 10*3/uL — ABNORMAL HIGH (ref 4.0–10.5)
WBC: 15.5 10*3/uL — ABNORMAL HIGH (ref 4.0–10.5)

## 2010-09-06 LAB — POCT I-STAT GLUCOSE
Glucose, Bld: 189 mg/dL — ABNORMAL HIGH (ref 70–99)
Glucose, Bld: 170 mg/dL — ABNORMAL HIGH (ref 70–99)
Operator id: 156951
Operator id: 156951

## 2010-09-06 LAB — PROTIME-INR
INR: 1.25 (ref 0.00–1.49)
Prothrombin Time: 15.9 seconds — ABNORMAL HIGH (ref 11.6–15.2)

## 2010-09-06 LAB — POCT I-STAT, CHEM 8
BUN: 16 mg/dL (ref 6–23)
Calcium, Ion: 1.15 mmol/L (ref 1.12–1.32)
Chloride: 108 mEq/L (ref 96–112)
Creatinine, Ser: 1.5 mg/dL (ref 0.4–1.5)
Glucose, Bld: 120 mg/dL — ABNORMAL HIGH (ref 70–99)
HCT: 32 % — ABNORMAL LOW (ref 39.0–52.0)
Hemoglobin: 10.9 g/dL — ABNORMAL LOW (ref 13.0–17.0)
Potassium: 4.2 mEq/L (ref 3.5–5.1)
Sodium: 141 mEq/L (ref 135–145)
TCO2: 22 mmol/L (ref 0–100)

## 2010-09-06 LAB — HEMOGLOBIN AND HEMATOCRIT, BLOOD
HCT: 24.6 % — ABNORMAL LOW (ref 39.0–52.0)
Hemoglobin: 8.2 g/dL — ABNORMAL LOW (ref 13.0–17.0)

## 2010-09-06 LAB — CREATININE, SERUM
Creatinine, Ser: 1.35 mg/dL (ref 0.4–1.5)
GFR calc Af Amer: >60 mL/min (ref >60)
GFR calc non Af Amer: 55 mL/min — ABNORMAL LOW (ref >60)

## 2010-09-06 LAB — GLUCOSE, CAPILLARY
Glucose-Capillary: 223 mg/dL — ABNORMAL HIGH (ref 70–99)
Glucose-Capillary: 138 mg/dL — ABNORMAL HIGH (ref 70–99)
Glucose-Capillary: 144 mg/dL — ABNORMAL HIGH (ref 70–99)
Glucose-Capillary: 125 mg/dL — ABNORMAL HIGH (ref 70–99)

## 2010-09-06 LAB — APTT: aPTT: 30 seconds (ref 24–37)

## 2010-09-06 LAB — PLATELET COUNT: Platelets: 146 10*3/uL — ABNORMAL LOW (ref 150–400)

## 2010-09-06 LAB — MAGNESIUM: Magnesium: 3 mg/dL — ABNORMAL HIGH (ref 1.5–2.5)

## 2010-09-07 ENCOUNTER — Inpatient Hospital Stay (HOSPITAL_COMMUNITY): Payer: Medicare Other

## 2010-09-07 LAB — CBC
HCT: 32.1 % — ABNORMAL LOW (ref 39.0–52.0)
HCT: 30.6 % — ABNORMAL LOW (ref 39.0–52.0)
Hemoglobin: 10.3 g/dL — ABNORMAL LOW (ref 13.0–17.0)
Hemoglobin: 10.1 g/dL — ABNORMAL LOW (ref 13.0–17.0)
MCH: 28.7 pg (ref 26.0–34.0)
MCH: 29.3 pg (ref 26.0–34.0)
MCHC: 32.1 g/dL (ref 30.0–36.0)
MCHC: 33 g/dL (ref 30.0–36.0)
MCV: 89.4 fL (ref 78.0–100.0)
MCV: 88.7 fL (ref 78.0–100.0)
Platelets: 195 10*3/uL (ref 150–400)
Platelets: 190 10*3/uL (ref 150–400)
RBC: 3.59 MIL/uL — ABNORMAL LOW (ref 4.22–5.81)
RBC: 3.45 MIL/uL — ABNORMAL LOW (ref 4.22–5.81)
RDW: 15.9 % — ABNORMAL HIGH (ref 11.5–15.5)
RDW: 16 % — ABNORMAL HIGH (ref 11.5–15.5)
WBC: 16.4 10*3/uL — ABNORMAL HIGH (ref 4.0–10.5)
WBC: 15.1 10*3/uL — ABNORMAL HIGH (ref 4.0–10.5)

## 2010-09-07 LAB — POCT I-STAT, CHEM 8
BUN: 18 mg/dL (ref 6–23)
Calcium, Ion: 1.11 mmol/L — ABNORMAL LOW (ref 1.12–1.32)
Chloride: 101 mEq/L (ref 96–112)
Creatinine, Ser: 1.5 mg/dL (ref 0.4–1.5)
Glucose, Bld: 225 mg/dL — ABNORMAL HIGH (ref 70–99)
HCT: 31 % — ABNORMAL LOW (ref 39.0–52.0)
Hemoglobin: 10.5 g/dL — ABNORMAL LOW (ref 13.0–17.0)
Potassium: 4.2 mEq/L (ref 3.5–5.1)
Sodium: 137 mEq/L (ref 135–145)
TCO2: 24 mmol/L (ref 0–100)

## 2010-09-07 LAB — BASIC METABOLIC PANEL
BUN: 15 mg/dL (ref 6–23)
CO2: 22 mEq/L (ref 19–32)
Calcium: 7.5 mg/dL — ABNORMAL LOW (ref 8.4–10.5)
Chloride: 106 mEq/L (ref 96–112)
Creatinine, Ser: 1.42 mg/dL (ref 0.4–1.5)
GFR calc Af Amer: >60 mL/min (ref >60)
GFR calc non Af Amer: 52 mL/min — ABNORMAL LOW (ref >60)
Glucose, Bld: 207 mg/dL — ABNORMAL HIGH (ref 70–99)
Potassium: 4.7 mEq/L (ref 3.5–5.1)
Sodium: 135 mEq/L (ref 135–145)

## 2010-09-07 LAB — GLUCOSE, CAPILLARY
Glucose-Capillary: 101 mg/dL — ABNORMAL HIGH (ref 70–99)
Glucose-Capillary: 108 mg/dL — ABNORMAL HIGH (ref 70–99)
Glucose-Capillary: 112 mg/dL — ABNORMAL HIGH (ref 70–99)
Glucose-Capillary: 116 mg/dL — ABNORMAL HIGH (ref 70–99)
Glucose-Capillary: 122 mg/dL — ABNORMAL HIGH (ref 70–99)
Glucose-Capillary: 123 mg/dL — ABNORMAL HIGH (ref 70–99)
Glucose-Capillary: 194 mg/dL — ABNORMAL HIGH (ref 70–99)
Glucose-Capillary: 207 mg/dL — ABNORMAL HIGH (ref 70–99)
Glucose-Capillary: 194 mg/dL — ABNORMAL HIGH (ref 70–99)
Glucose-Capillary: 252 mg/dL — ABNORMAL HIGH (ref 70–99)
Glucose-Capillary: 202 mg/dL — ABNORMAL HIGH (ref 70–99)
Glucose-Capillary: 207 mg/dL — ABNORMAL HIGH (ref 70–99)

## 2010-09-07 LAB — CREATININE, SERUM
Creatinine, Ser: 1.45 mg/dL (ref 0.4–1.5)
GFR calc Af Amer: >60 mL/min (ref >60)
GFR calc non Af Amer: 50 mL/min — ABNORMAL LOW (ref >60)

## 2010-09-07 LAB — MAGNESIUM
Magnesium: 2.4 mg/dL (ref 1.5–2.5)
Magnesium: 2.2 mg/dL (ref 1.5–2.5)

## 2010-09-07 NOTE — Progress Notes (Signed)
----   Converted from flag ---- ---- 08/28/2010 2:19 PM, Charm Rings, CMA, AAMA wrote: Pt has Medicare and AARP MCR sup, no pac rqd  ---- 08/28/2010 2:02 PM, Rhett Bannister  LPN wrote: [facility]um Cath on Wed. 08/30/10 for angina.  Dr. Fletcher Anon performing procedure. ------------------------------

## 2010-09-08 ENCOUNTER — Inpatient Hospital Stay (HOSPITAL_COMMUNITY): Payer: Medicare Other

## 2010-09-08 DIAGNOSIS — I2510 Atherosclerotic heart disease of native coronary artery without angina pectoris: Secondary | ICD-10-CM

## 2010-09-08 LAB — GLUCOSE, CAPILLARY
Glucose-Capillary: 161 mg/dL — ABNORMAL HIGH (ref 70–99)
Glucose-Capillary: 92 mg/dL (ref 70–99)
Glucose-Capillary: 120 mg/dL — ABNORMAL HIGH (ref 70–99)
Glucose-Capillary: 71 mg/dL (ref 70–99)
Glucose-Capillary: 142 mg/dL — ABNORMAL HIGH (ref 70–99)
Glucose-Capillary: 62 mg/dL — ABNORMAL LOW (ref 70–99)
Glucose-Capillary: 169 mg/dL — ABNORMAL HIGH (ref 70–99)
Glucose-Capillary: 188 mg/dL — ABNORMAL HIGH (ref 70–99)
Glucose-Capillary: 171 mg/dL — ABNORMAL HIGH (ref 70–99)

## 2010-09-08 LAB — BASIC METABOLIC PANEL
BUN: 16 mg/dL (ref 6–23)
CO2: 25 mEq/L (ref 19–32)
Calcium: 8 mg/dL — ABNORMAL LOW (ref 8.4–10.5)
Chloride: 104 mEq/L (ref 96–112)
Creatinine, Ser: 1.27 mg/dL (ref 0.4–1.5)
GFR calc Af Amer: >60 mL/min (ref >60)
GFR calc non Af Amer: 59 mL/min — ABNORMAL LOW (ref >60)
Glucose, Bld: 130 mg/dL — ABNORMAL HIGH (ref 70–99)
Potassium: 3.6 mEq/L (ref 3.5–5.1)
Sodium: 139 mEq/L (ref 135–145)

## 2010-09-08 LAB — CBC
HCT: 28.7 % — ABNORMAL LOW (ref 39.0–52.0)
Hemoglobin: 9.3 g/dL — ABNORMAL LOW (ref 13.0–17.0)
MCH: 29 pg (ref 26.0–34.0)
MCHC: 32.4 g/dL (ref 30.0–36.0)
MCV: 89.4 fL (ref 78.0–100.0)
Platelets: 161 10*3/uL (ref 150–400)
RBC: 3.21 MIL/uL — ABNORMAL LOW (ref 4.22–5.81)
RDW: 16.2 % — ABNORMAL HIGH (ref 11.5–15.5)
WBC: 13.3 10*3/uL — ABNORMAL HIGH (ref 4.0–10.5)

## 2010-09-08 NOTE — Op Note (Signed)
  NAMETAYVON, YOUSUF              ACCOUNT NO.:  [id number]  MEDICAL RECORD NO.:  [id number]           PATIENT TYPE:  I  LOCATION:  2302                         FACILITY:  [facility]  PHYSICIAN:  Leda Quail, M.D.        DATE OF BIRTH:  July 23, 1956  DATE OF PROCEDURE:  09/06/2010 DATE OF DISCHARGE:                              OPERATIVE REPORT   PROCEDURE:  Intraoperative transesophageal echocardiography.  Mr. Shamburg is a 55 year old with known significant coronary artery disease as well as carotid artery disease.  He is scheduled at this time for carotid endarterectomy followed by coronary artery bypass grafting under general anesthesia.  A TEE will be used intraoperatively to assess left ventricular function as well as aortic valve dysfunction that was discovered at cardiac catheterization.  The patient has no history of esophageal or gastric pathology.  After induction of general anesthesia, the airway was secured with an oral endotracheal tube.  The orogastric tube was placed with gastric contents suctioned and then removed.  The transesophageal transducer was heavily lubricated, placed down the oropharynx with no significant resistance.  It remained at the 40 to 45 cm mark throughout the case. There was no evidence of oropharyngeal damage on removal of the probe.  The prebypass examination revealed the left ventricle to be overall thickened.  There were no segmental defects.  Contractility was good. The mitral valve appearance was normal with no prolapse or mass was noted.  Color Doppler did reveal a 1+ central regurgitant flow jet.  The aortic valve had 3 leaflets.  There was limited opening of the right coronary cusp due to significant calcification of the annulus and the cusp itself.  Flow studies revealed only mild aortic stenosis.  There was no aortic insufficiency on color Doppler.  Right heart exam showed the appearance of the Swan-Ganz catheter in the atrium and  ventricle. Tricuspid valve appeared normal with just a trace of regurgitant flow again with the Swan-Ganz catheter in place.  The patient was placed on cardiopulmonary bypass and underwent coronary artery bypass grafting at the completion of the procedure.  Left ventricular function was now dynamic on inotropic dopamine support.  The mitral valve continued to show 1+ well-defined central regurgitant jet.  The aortic valve again showed no aortic insufficiency, but continued to document mild aortic stenosis on flow studies.  The right heart exam was essentially unchanged from the prebypass examination.  There were no other significant findings postbypass.          ______________________________ Leda Quail, M.D.     LK/MEDQ  D:  09/06/2010  T:  09/07/2010  Job:  [id number]  Electronically Signed by Leda Quail M.D. on 09/08/2010 01:16:39 PM

## 2010-09-09 ENCOUNTER — Inpatient Hospital Stay (HOSPITAL_COMMUNITY): Payer: Medicare Other

## 2010-09-09 DIAGNOSIS — I4891 Unspecified atrial fibrillation: Secondary | ICD-10-CM

## 2010-09-09 LAB — CBC
HCT: 29.6 % — ABNORMAL LOW (ref 39.0–52.0)
Hemoglobin: 9.4 g/dL — ABNORMAL LOW (ref 13.0–17.0)
MCH: 28.7 pg (ref 26.0–34.0)
MCHC: 31.8 g/dL (ref 30.0–36.0)
MCV: 90.5 fL (ref 78.0–100.0)
Platelets: 161 10*3/uL (ref 150–400)
RBC: 3.27 MIL/uL — ABNORMAL LOW (ref 4.22–5.81)
RDW: 16.4 % — ABNORMAL HIGH (ref 11.5–15.5)
WBC: 13.3 10*3/uL — ABNORMAL HIGH (ref 4.0–10.5)

## 2010-09-09 LAB — GLUCOSE, CAPILLARY
Glucose-Capillary: 115 mg/dL — ABNORMAL HIGH (ref 70–99)
Glucose-Capillary: 76 mg/dL (ref 70–99)
Glucose-Capillary: 85 mg/dL (ref 70–99)
Glucose-Capillary: 103 mg/dL — ABNORMAL HIGH (ref 70–99)
Glucose-Capillary: 139 mg/dL — ABNORMAL HIGH (ref 70–99)
Glucose-Capillary: 189 mg/dL — ABNORMAL HIGH (ref 70–99)
Glucose-Capillary: 111 mg/dL — ABNORMAL HIGH (ref 70–99)
Glucose-Capillary: 123 mg/dL — ABNORMAL HIGH (ref 70–99)

## 2010-09-09 LAB — BASIC METABOLIC PANEL
BUN: 21 mg/dL (ref 6–23)
CO2: 28 mEq/L (ref 19–32)
Calcium: 8.4 mg/dL (ref 8.4–10.5)
Chloride: 109 mEq/L (ref 96–112)
Creatinine, Ser: 1.29 mg/dL (ref 0.4–1.5)
GFR calc Af Amer: >60 mL/min (ref >60)
GFR calc non Af Amer: 58 mL/min — ABNORMAL LOW (ref >60)
Glucose, Bld: 81 mg/dL (ref 70–99)
Potassium: 4 mEq/L (ref 3.5–5.1)
Sodium: 145 mEq/L (ref 135–145)

## 2010-09-10 DIAGNOSIS — E1165 Type 2 diabetes mellitus with hyperglycemia: Secondary | ICD-10-CM

## 2010-09-10 DIAGNOSIS — IMO0001 Reserved for inherently not codable concepts without codable children: Secondary | ICD-10-CM

## 2010-09-10 LAB — GLUCOSE, CAPILLARY
Glucose-Capillary: 91 mg/dL (ref 70–99)
Glucose-Capillary: 140 mg/dL — ABNORMAL HIGH (ref 70–99)
Glucose-Capillary: 187 mg/dL — ABNORMAL HIGH (ref 70–99)
Glucose-Capillary: 141 mg/dL — ABNORMAL HIGH (ref 70–99)
Glucose-Capillary: 185 mg/dL — ABNORMAL HIGH (ref 70–99)
Glucose-Capillary: 112 mg/dL — ABNORMAL HIGH (ref 70–99)

## 2010-09-11 ENCOUNTER — Inpatient Hospital Stay (HOSPITAL_COMMUNITY): Admit: 2010-09-11 | Payer: Self-pay

## 2010-09-11 LAB — GLUCOSE, CAPILLARY
Glucose-Capillary: 101 mg/dL — ABNORMAL HIGH (ref 70–99)
Glucose-Capillary: 187 mg/dL — ABNORMAL HIGH (ref 70–99)
Glucose-Capillary: 122 mg/dL — ABNORMAL HIGH (ref 70–99)
Glucose-Capillary: 136 mg/dL — ABNORMAL HIGH (ref 70–99)

## 2010-09-11 LAB — BASIC METABOLIC PANEL
BUN: 19 mg/dL (ref 6–23)
CO2: 29 mEq/L (ref 19–32)
Calcium: 8.2 mg/dL — ABNORMAL LOW (ref 8.4–10.5)
Chloride: 107 mEq/L (ref 96–112)
Creatinine, Ser: 1.29 mg/dL (ref 0.4–1.5)
GFR calc Af Amer: >60 mL/min (ref >60)
GFR calc non Af Amer: 58 mL/min — ABNORMAL LOW (ref >60)
Glucose, Bld: 123 mg/dL — ABNORMAL HIGH (ref 70–99)
Potassium: 3.4 mEq/L — ABNORMAL LOW (ref 3.5–5.1)
Sodium: 142 mEq/L (ref 135–145)

## 2010-09-11 LAB — CBC
HCT: 28.8 % — ABNORMAL LOW (ref 39.0–52.0)
Hemoglobin: 8.9 g/dL — ABNORMAL LOW (ref 13.0–17.0)
MCH: 28.5 pg (ref 26.0–34.0)
MCHC: 30.9 g/dL (ref 30.0–36.0)
MCV: 92.3 fL (ref 78.0–100.0)
Platelets: 184 10*3/uL (ref 150–400)
RBC: 3.12 MIL/uL — ABNORMAL LOW (ref 4.22–5.81)
RDW: 16.3 % — ABNORMAL HIGH (ref 11.5–15.5)
WBC: 8.6 10*3/uL (ref 4.0–10.5)

## 2010-09-11 NOTE — Op Note (Signed)
[name] [name], [name] [name]              ACCOUNT NO.:  [id number]  MEDICAL RECORD NO.:  [id number]           PATIENT TYPE:  I  LOCATION:  2302                         FACILITY:  [facility]  PHYSICIAN:  Nelda Severe. Kellie Simmering, M.D.  DATE OF BIRTH:  1955-12-15  DATE OF PROCEDURE:  09/06/2010 DATE OF DISCHARGE:                              OPERATIVE REPORT   PREOPERATIVE DIAGNOSIS:  Severe left internal carotid stenosis with history of left hemispheric transient ischemic attacks plus severe coronary artery disease.  POSTOPERATIVE DIAGNOSIS:  Severe left internal carotid stenosis with history of left hemispheric transient ischemic attacks plus severe coronary artery disease.  OPERATION:  Combined left carotid endarterectomy and coronary artery bypass grafting.  I will dictate the left carotid endarterectomy portion.  Dr. Roxan Hockey will dictate the other portion.  SURGEON:  Nelda Severe. Kellie Simmering, MD  FIRST ASSISTANT:  Revonda Standard. Roxan Hockey, MD and Wray Kearns, PA-C  BRIEF HISTORY:  This patient who was scheduled to undergo coronary artery bypass grafting by Dr. Roxan Hockey, had a carotid duplex exam, which was abnormal with greater than 80% left internal carotid stenosis. The patient states that he had had 2 or 3 episodes of garbled speech and right upper extremity weakness over the last 6 months, but none over the past 2 months.  These occurred while on Plavix.  He was scheduled for combined left carotid endarterectomy and coronary artery bypass graft.  PROCEDURE:  The patient was taken to the operating room and placed in supine position, at which time, satisfactory general endotracheal anesthesia was administered.  The patient was prepped and draped in routine manner for coronary artery bypass grafting, left neck being included.  Monitoring lines were placed by Anesthesia.  Longitudinal incision was made in the anterior border of the sternocleidomastoid muscle on the left side, carried down  through subcutaneous tissue and platysma using the Bovie.  Common facial vein and external jugular veins ligated with 3-0 silk ties and divided exposing the common internal and external carotid arteries.  There was a calcified plaque beginning in the distal common carotid extending up the internal carotid artery about 3-4 cm.  Distal vessel appeared normal and was of normal size.  A #10 shunt was prepared and the patient was heparinized.  Carotid vessels were occluded with vascular clamps, longitudinal opening made in the common carotid with 15-blade extended up in the internal carotid with Potts scissors to a point distal to the disease.  The plaque appeared to be at least 90% stenotic in severity excellent backbleeding from above. A #10 shunt was inserted without difficulty reestablishing the flow in about 2 minutes.  Standard endarterectomy was then performed using elevator and Potts scissors with eversion endarterectomy of the external carotid.  The plaque feathered off the distal internal carotid artery nicely, not requiring any tacking sutures.  The lumen was thoroughly irrigated with heparin saline.  All loose debris were carefully removed. Arteriotomy was closed with a Dacron Savage patch.  Following antegrade and retrograde flushing, the closure was completed, reestablishing flow initially up the external and up the internal branch.  There was excellent Doppler flow in both vessels.  Adequate  hemostasis was achieved.  No protamine was given at this time.  The wound was packed during the coronary artery bypass grafting procedure and Dr. Roxan Hockey closed the wound at the conclusion of the procedure in layers in subcuticular fashion.  Sterile dressing was applied.  The patient was taken to surgical intensive care unit in stable condition.     Nelda Severe Kellie Simmering, M.D.     JDL/MEDQ  D:  09/06/2010  T:  09/07/2010  Job:  [id number]  Electronically Signed by Tinnie Gens M.D. on  09/11/2010 12:31:30 PM

## 2010-09-12 LAB — GLUCOSE, CAPILLARY
Glucose-Capillary: 103 mg/dL — ABNORMAL HIGH (ref 70–99)
Glucose-Capillary: 117 mg/dL — ABNORMAL HIGH (ref 70–99)

## 2010-09-19 ENCOUNTER — Ambulatory Visit: Payer: Medicare Other | Admitting: Vascular Surgery

## 2010-09-26 ENCOUNTER — Ambulatory Visit (INDEPENDENT_AMBULATORY_CARE_PROVIDER_SITE_OTHER): Payer: Medicare Other | Admitting: Vascular Surgery

## 2010-09-26 ENCOUNTER — Encounter: Payer: Self-pay | Admitting: Cardiovascular Disease

## 2010-09-26 DIAGNOSIS — I6529 Occlusion and stenosis of unspecified carotid artery: Secondary | ICD-10-CM

## 2010-09-26 NOTE — Op Note (Addendum)
[name] [name], [name] [name]              ACCOUNT NO.:  [id number]  MEDICAL RECORD NO.:  [id number]           PATIENT TYPE:  I  LOCATION:  2302                         FACILITY:  [facility]  PHYSICIAN:  Revonda Standard. Roxan Hockey, M.D.DATE OF BIRTH:  06/02/1956  DATE OF PROCEDURE:  09/06/2010 DATE OF DISCHARGE:                              OPERATIVE REPORT   PREOPERATIVE DIAGNOSIS:  Severe three-vessel coronary disease with progressive exertional angina, symptomatic left carotid stenosis.  POSTOPERATIVE DIAGNOSES:  Severe three-vessel coronary disease with progressive exertional angina, symptomatic left carotid stenosis plus aortic sclerosis and mild mitral regurgitation.  PROCEDURE:  Coronary artery bypass grafting x4 (left internal mammary artery to LAD; left radial artery to right coronary; sequential saphenous vein graft to obtuse marginals 3 and 4); endoscopic vein harvest, right thigh.  SURGEON:  Revonda Standard. Roxan Hockey, M.D. ASSISTANT:  Lars Pinks, PA  SECOND ASSISTANT:  Suzzanne Cloud, PA  ANESTHESIA:  General.  FINDINGS:  Transesophageal echocardiography revealed relatively preserved left ventricular function.  There was some sclerosis and very mild aortic stenosis, but only a peak gradient of 12 mmHg, mild mitral insufficiency with normal leaflet morphology.  OM-3 and 4 small fair quality vessels, LAD and right coronary large good-quality vessels and good-quality conduits.  CLINICAL NOTE:  [name] [name] is a 55 year old gentleman with multiple cardiac risk factors who presents with progressive exertional angina, has symptoms with even minimal activity.  He underwent cardiac catheterization by Dr. [name], which revealed severe three- vessel coronary disease with 80% ostial right coronary stenosis and 80% ostial LAD stenosis, and severe disease of obtuse marginals 2 and 3, which were small posterior lateral branches.  He did not have a significant gradient across his  aortic valve at the time of catheterization.  The patient was referred for coronary bypass grafting. The indications, risks, benefits, and alternatives were discussed in detail with the patient.  He understood and accepted risks and agreed to proceed.  The patient did report a TIA in September 2011.  His preoperative evaluation with carotid duplex he had a greater than 80% left internal carotid stenosis.  He was referred to Dr. Mamie Nick who felt that carotid endarterectomy was indicated at the time of bypass surgery to decrease his risk for perioperative stroke as well as long- term risk for stroke, and the patient understood and wished to proceed with combined procedure.  OPERATIVE NOTE:  [name] [name] was brought to the preop holding area on September 06, 2010.  There, the Anesthesia Service placed a Swan-Ganz catheter and arterial blood pressure monitoring line.  Intravenous antibiotics were administered.  He was taken to the operating room, anesthetized, and intubated.  Transesophageal echocardiography was performed.  Please refer to Dr. Tressa Busman separately dictated note for details.  The patient had no hemodynamically significant valvular disease and had relatively well-preserved left ventricular function. The chest, abdomen, legs, and left neck were prepped and draped in usual fashion.  Dr. Kellie Simmering then performed a left carotid endarterectomy. Please refer to his separately dictated note for details of procedure. The wound was packed and interrupted sutures were placed for hemostatic purposes during the coronary bypass  grafting.  After Dr. Kellie Simmering had completed the left carotid endarterectomy, the left arm was brought out from beneath the drape and was prepped and draped in the usual sterile fashion.  Incision was made in the volar aspect of the left wrist, a short incision was made distally initially, and short segment of the radial artery was mobilized.  There was a good  pulse distally with proximal occlusion confirming the results of the preoperative Allen's test, which had been done both with capillary refill and pulse oximetry as well as a Doppler of the palmar arch.  The incision then was extended to just below the antecubital fossa and the radial artery was harvested using harmonic scalpel.  Simultaneously, incision was made in the medial aspect of the right leg at the level of the knee, the greater saphenous vein was identified and harvested endoscopically.  It was a good quality vessel also simultaneously, a median sternotomy was performed and the left internal mammary artery was harvested using standard technique.  The internal mammary artery was a good-quality vessel.  The harvest was relatively difficult due the patient's body habitus.  After harvesting the conduits, the remainder of the full heparin dose was given, the pericardium was opened after confirming adequate anticoagulation with ACT measurement.  The aorta was cannulated via concentric 2-0 Ethilon pledgeted pursestring sutures.  A dual stage venous cannula placed via pursestring suture in the right atrial appendage.  Cardiopulmonary bypass was instituted, and the patient was cooled to 32 degrees Celsius.  The coronary arteries were inspected and anastomotic sites were chosen.  The conduits were inspected and cut to length and a foam pad was placed in the pericardium to insulate the heart and protect left phrenic nerve.  A temperature probe was placed in myocardial septum and a cardioplegic cannula was placed in the ascending aorta.  The aorta was crossclamped.  The left ventricle was emptied via aortic root vent.  Cardiac arrest then was achieved with combination of cold antegrade blood cardioplegia and topical iced saline.  1.5 liters of cardioplegia was administered.  Myocardial septal temperature fell to 9 degrees Celsius following distal anastomoses were performed.  First, the  distal end of the left radial artery was beveled and was anastomosed end-to-side to the distal right coronary.  This was a 2-mm good-quality target.  The radial was a 1.5-mm good-quality conduit.  The anastomosis was performed end-to-side with a running 8-0 Prolene suture. At the completion of the anastomosis, cardioplegia was administered. There was good flow and good hemostasis with cardioplegia down the aortic root.  There was good backbleeding from the vessel.  Next, a reverse saphenous vein graft was placed sequentially to obtuse marginals 3 and 4.  There were four anterior and posterolateral branches arise from the circumflex on catheterization, only three vessels could be identified.  It did appear that OM-2 was likely intramyocardial based on the appearance of the catheterization in the posterior aspect of the heart.  The two distal-most vessels that could be identified were grafted, OM-3 was a 1.5-mm fair-quality target, OM-4 was a 1-mm fair- quality target, and side-to-side anastomosis was performed to OM-3 and end-to-side OM-4.  Both were done with running 7-0 Prolene sutures. Both anastomoses probed proximally and distally at their completion.  At the completion of the second anastomosis, cardioplegia was administered. There was good flow through the graft and good hemostasis.  Next, the left internal mammary artery was brought through a window in the pericardium.  The distal end was beveled.  It was anastomosed end-to- side to the LAD.  The LAD was a 2-mm good-quality target.  The mammary was a 2-mm good-quality conduit.  The anastomosis was performed end-to- side with running 8-0 Prolene suture.  At the completion of the mammary to LAD anastomosis, the bulldog clamps were briefly removed to inspect for hemostasis.  Immediate rapid septal rewarming was noted.  The bulldog clamp was replaced.  The mammary pedicle was tacked to the epicardial surface of the heart with 6-0  Prolene sutures  Additional cardioplegia was administered.  The vein graft and radial artery were cut to length.  The proximal radial artery anastomosis was performed to 4.0 mm punch aortotomy with a running 7-0 Prolene suture. The proximal vein graft anastomosis then was performed to a 4.0 mm punch aortotomy with a running 6-0 Prolene suture.  At the completion of the final proximal anastomosis, the patient was placed in Trendelenburg position.  Lidocaine was administered.  The bulldog clamps again removed from the left mammary artery.  The aortic root was de-aired, and the aortic crossclamp was removed.  The total crossclamp time was 82 minutes.  The patient did not require defibrillation.  The patient was rewarmed. All proximal and distal anastomoses were inspect for hemostasis. Epicardial pacing wires were placed on the right ventricle and right atrium.  When the patient rewarmed to a core temperature of 37 degrees Celsius, he was weaned from cardiopulmonary bypass without difficulty on the first attempt.  Total bypass time was 110 minutes.  The initial cardiac index was greater than 2 liters per minute per meter squared, and the patient remained hemodynamically stable throughout postbypass period.  Test dose of protamine was administered and was well tolerated.  The atrial and aortic cannulae were removed.  The remainder of the protamine was administered without incident.  Chest irrigated with 1 liter of saline.  The pericardium was reapproximated over the aortic root and base of the heart with interrupted 3-0 silk sutures.  A left pleural and single mediastinal chest tubes were placed through separate subcostal incisions.  The sternum was closed with a combination of single and double heavy gauge stainless steel wires.  The pectoralis fascia, subcutaneous tissue, and skin were closed in standard fashion.  The radial artery incision had been closed at completion of the harvest  as were the incisions on the leg closed at the completion of the harvest of these vessels as well.  After closure of the chest, the packing was removed from the left neck.  Hemostasis was achieved.  A 10 flat Blake drain was placed through separate stab incision and secured with a 2-0 silk suture.  The incision was closed in two layers with a running 2-0 Vicryl suture to close the platysma followed by a running 3-0 Vicryl subcuticular suture.  All sponge, needle, and instrument counts were correct at the end of the procedure.  There were no intraoperative complications.  The patient was taken from the operating room to the surgical intensive care unit in fair condition.     Revonda Standard Roxan Hockey, M.D.     SCH/MEDQ  D:  09/06/2010  T:  09/07/2010  Job:  [id number]  cc:   [name], MD Nelda Severe. Kellie Simmering, M.D. Eduard Clos. Gilford Rile, MD  Electronically Signed by Modesto Charon M.D. on 09/26/2010 07:32:31 PM

## 2010-09-26 NOTE — Discharge Summary (Addendum)
NAMEBOBBIE, CHAI NO.:  [id number]  MEDICAL RECORD NO.:  [id number]           PATIENT TYPE:  I  LOCATION:  2006                         FACILITY:  [facility]  PHYSICIAN:  Revonda Standard. Roxan Hockey, M.D.DATE OF BIRTH:  05-May-1956  DATE OF ADMISSION:  09/06/2010 DATE OF DISCHARGE:  09/12/2010                              DISCHARGE SUMMARY   HISTORY:  The patient is a 55 year old gentleman with a history of coronary artery disease with previous PTCA and stenting in January 2011. He has a history of insulin-dependent diabetes type 2 as well as multiple comorbidities.  Over the past several months, he has noted generalized fatigue.  In addition, he has had shortness of breath with minimal activities as well as chest tightness with minimal activity.  He states that in his house, he has 6 steps of landing, and if he walks up those, he will get symptomatic with both shortness of breath and chest discomfort.  He saw Dr. Gilford Rile and was subsequently referred to Dr. Fletcher Anon for further evaluation and treatment, and this included cardiac catheterization, which was done on August 30, 2010.  This reveals 3- vessel coronary artery disease including an 80% in-stent stenosis of the LAD.  There was an 80% ostial right coronary stenosis.  There was an 80- 90% lesion in the obtuse marginal 3 and 4.  He had an ejection fraction of 50%.  Due to these findings, he was referred to Modesto Charon, MD, for consideration of surgical revascularization.  He was also noted to have significant left internal carotid artery stenosis and was seen in Vascular Surgical consultation by Dr. Kellie Simmering, who recommended a concurrent left carotid endarterectomy.  He was admitted in this hospitalization for those procedures.  PAST MEDICAL HISTORY: 1. Coronary artery disease with previous PTCA and stenting of the LAD     in 2011. 2. Hypertension. 3. Hyperlipidemia. 4. Insulin-dependent diabetes mellitus  type 2. 5. Adult-onset diabetes. 6. Obstructive sleep apnea. 7. Obesity. 8. Mild aortic stenosis or sclerosis. 9. History of tobacco abuse. 10.History of depression. 11.History of psoriasis. 12.History of neuropathy. 13.History of previous splenectomy. 14.Previous history of angioplasty of his iliac and SFA due to     peripheral arterial disease. 15.Questionable history of DVT.  ALLERGIES:  Include PENICILLIN, which causes anaphylaxis.  MEDICATIONS PRIOR TO ADMISSION: 1. Aspirin 325 mg daily. 2. Plavix 75 mg daily. 3. Cymbalta 60 mg daily. 4. Diazepam 2 mg t.i.d. p.r.n. 5. Fentanyl 50 mcg patch changed every third day. 6. Lantus insulin 50 units at bedtime. 7. Lipitor 40 units daily. 8. Metoprolol 25 mg b.i.d. 9. Fish oil daily. 10.Vitamin D daily. 11.Oxycodone p.r.n.  FAMILY HISTORY:  His mother had heart disease in her 24s.  SOCIAL HISTORY:  He is married.  He is retired on disability.  He did smoke up until 3 days before surgical evaluation.  He does not drink alcohol.  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  REVIEW OF SYSTEMS AND PHYSICAL EXAMINATION:  Please see the history and physical done at the time of admission.  Of note, the patient does have a history of Bell's palsy affecting his left side.  He also has decreased sensation in a stocking distribution.  HOSPITAL COURSE:  The patient was admitted electively, and on September 06, 2010, he underwent the following procedure:  Coronary artery bypass grafting x4 with the following grafts placed: 1. A left internal mammary artery to the LAD. 2. A left radial artery to the right coronary artery. 3. A sequential saphenous vein graft to marginals 3 and 4.  In addition, Dr. Kellie Simmering performed a left carotid endarterectomy with Dacron patch angioplasty.  He tolerated these procedures well, was taken to the surgical intensive care unit in stable  condition.  POSTOPERATIVE HOSPITAL COURSE:  The patient has overall progressed nicely.  He is maintained.  He has stable neurological exam.  He was weaned from the ventilator without difficulty.  All routine lines, monitors, drainage devices were discontinued in the standard fashion. He did have some mild postoperative delirium, but that has resolved over time.  He did have postoperative atrial fibrillation with rapid ventricular response, but this subsequently has been chemically cardioverted to a normal sinus rhythm using amiodarone and beta- blockade.  He is tolerating gradual increase in activity using standard protocols.  His incisions are healing well without evidence of infection.  He is a moderate volume overload, but is responding well to diuresis, which we will continue as an outpatient.  His oxygen has been weaned.  He maintained good saturations on room air.  His overall status is felt to be deemed to be acceptable for discharge on September 12, 2010.  MEDICATIONS AT DISCHARGE:  Include the following: 1. Amiodarone 400 mg twice daily for an additional 7 days, then 400 mg     daily. 2. Lasix 40 mg daily for an additional 7 days. 3. Imdur 30 mg daily for 30 additional days for radial artery harvest     antispasm therapy. 4. Metoprolol 50 mg p.o. b.i.d. 5. Potassium chloride 20 mEq daily x7 days. 6. Ultram 50 mg 1 to 2 every 6 hours p.r.n. for pain. 7. Aspirin 325 mg enteric-coated tablet daily. 8. Cymbalta 60 mg daily. 9. Diazepam 2 mg 3 times daily. 10.Diazepam 5 mg daily at bedtime. 11.Glucosamine q.a.m. as previously. 12.Lantus insulin 50 units q.p.m. as previously. 13.Lipitor 40 mg daily at bedtime. 14.Magnesium 250 mg p.o. twice daily.  The patient has not been restarted on his Plavix or cyclobenzaprine.  INSTRUCTIONS:  The patient will receive written instructions in regard to medications, activity, diet, wound care, and followup.  Followup included Dr. Fletcher Anon  with Cardiology 2 weeks post discharge, Dr. Roxan Hockey on October 04, 2010, at 11:30 with a chest x-ray.  Dr. Kellie Simmering will see the patient in 2 weeks.  His office will make these arrangements.  FINAL DIAGNOSES: 1. Severe coronary artery disease, now status post surgical     revascularization as described. 2. Postoperative atrial fibrillation. 3. Postoperative volume overload. 4. Postoperative anemia.  Most recent hemoglobin/hematocrit dated     September 11, 2010, are 8.9 and 29 respectively. 5. History of previous percutaneous transluminal coronary angioplasty     and stenting of the LAD. 6. History of hypertension. 7. Hyperlipidemia. 8. Diabetes mellitus. 9. Obesity. 10.Obstructive sleep apnea. 11.Mild aortic stenosis or sclerosis. 12.History of tobacco abuse. 13.History of depression. 14.History of psoriasis. 15.History of neuropathy. 16.History of previous splenectomy. 17.History of peripheral artery disease with previous angioplasty of     his iliac and SFA. 18.History of possible deep venous thrombosis  in the past history. 19.History of PENICILLIN allergy with anaphylaxis.     John Giovanni, P.A.-C.   ______________________________ Revonda Standard Roxan Hockey, M.D.    Loren Racer  D:  09/12/2010  T:  09/12/2010  Job:  [id number]  cc:   Nelda Severe. Kellie Simmering, M.D. [name], MD Ronette Deter, MD  Electronically Signed by Patrick Jupiter GOLD P.A.-C. on 09/19/2010 10:40:17 AM Electronically Signed by Modesto Charon M.D. on 09/26/2010 12:22:37 PM Electronically Signed by Modesto Charon M.D. on 09/26/2010 12:43:25 PM Electronically Signed by Modesto Charon M.D. on 09/26/2010 01:01:34 PM Electronically Signed by Modesto Charon M.D. on 09/26/2010 01:21:14 PM Electronically Signed by Modesto Charon M.D. on 09/26/2010 01:42:50 PM Electronically Signed by Modesto Charon M.D. on 09/26/2010 02:02:41 PM Electronically Signed by Modesto Charon M.D. on 09/26/2010  02:22:58 PM Electronically Signed by Modesto Charon M.D. on 09/26/2010 02:45:34 PM Electronically Signed by Modesto Charon M.D. on 09/26/2010 03:09:06 PM Electronically Signed by Modesto Charon M.D. on 09/26/2010 03:33:56 PM Electronically Signed by Modesto Charon M.D. on 09/26/2010 04:08:52 PM Electronically Signed by Modesto Charon M.D. on 09/26/2010 04:40:11 PM Electronically Signed by Modesto Charon M.D. on 09/26/2010 05:12:18 PM Electronically Signed by Modesto Charon M.D. on 09/26/2010 05:12:18 PM Electronically Signed by Modesto Charon M.D. on 09/26/2010 05:41:45 PM Electronically Signed by Modesto Charon M.D. on 09/26/2010 07:32:31 PM

## 2010-09-26 NOTE — H&P (Addendum)
[name] [name], [name] [name]              ACCOUNT NO.:  [id number]  MEDICAL RECORD NO.:  [id number]           PATIENT TYPE:  LOCATION:                                 FACILITY:  PHYSICIAN:  Revonda Standard. Roxan Hockey, M.D.DATE OF BIRTH:  Aug 10, 1955  DATE OF ADMISSION: DATE OF DISCHARGE:                             HISTORY & PHYSICAL   REASON FOR CONSULTATION:  Three-vessel coronary artery disease.  HISTORY OF PRESENT ILLNESS:  [name] [name] is a 55 year old gentleman with a history of coronary artery disease with previous PTCA and stenting in January 2011.  He also has a history of insulin-dependent type 2 diabetes, tobacco abuse, hyperlipidemia, hypertension, sleep apnea, obesity, depression, psoriasis, and peripheral neuropathy.  He states that, over the past several months, he has had generalized fatigue.  He also gets short of breath with minimal activities and gets tightness in his chest with minimal activities.  He has about 6 steps leading up to his house, if he walks up those, he will have symptoms.  He saw Dr. Gilford Rile and was referred to Dr. Fletcher Anon, and Dr. Fletcher Anon recommended cardiac catheterization which was done yesterday.  It showed 3-vessel coronary artery disease.  He had an 80% in-stent restenosis in his LAD.  There was an 80% ostial right coronary stenosis.  There was an 80-90% lesions in OM-3 and 4.  He had an ejection fraction of 50%.  PAST MEDICAL HISTORY:  Significant for coronary artery disease, previous PTCA and stenting of his LAD in 2011, hypertension, hyperlipidemia, insulin-dependent type 2, adult-onset diabetes, obstructive sleep apnea, obesity, mild aortic stenosis or sclerosis, tobacco abuse, depression, psoriasis, neuropathy, previous splenectomy, peripheral arterial disease with previous peripheral angioplasties of his iliac and SFA, and a questionable history of DVT.  CURRENT MEDICATIONS: 1. Aspirin 325 mg daily. 2. Plavix 75 mg daily. 3. Cymbalta 60 mg  daily. 4. Diazepam 2 mg t.i.d. p.r.n. 5. Fentanyl 50 mcg, change every third day. 6. Lantus insulin 50 units subcu at bedtime. 7. Lipitor 40 mg daily. 8. Metoprolol 25 mg b.i.d. 9. Fish oil daily. 10.Vitamin D 3000 units daily. 11.Oxycodone 5/325 p.r.n.  He has an allergy to PENICILLIN with anaphylaxis.  FAMILY HISTORY:  Mother had heart disease in her 16s.  SOCIAL HISTORY:  He is married.  He is retired on disability.  He did smoke up until 3 days ago.  He does not drink alcohol.  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  REVIEW OF SYSTEMS:  The patient notes chest tightness, shortness of breath with exertion, heart murmur, productive cough, constipation, pain in his legs with walking, neuropathy in his feet, blood clot below-the- knee in the vein on the left leg, has had temporary blindness in 1 eye, Bells palsy with some slurred speech and facial droop, arthritis, joint pain, muscle pain, depression, nervousness.  All other systems are negative.  PHYSICAL EXAMINATION:  GENERAL:  Mr. Companion is a 55 year old obese gentleman, in no acute distress.  He is overweight, but otherwise well developed and well nourished. VITAL SIGNS:  Blood pressure 157/96, pulse 96, respirations 20, oxygen  saturation 99% on room air. NEUROLOGIC:  He has of Bell palsy, affecting his left side.  He has decreased sensation in the stocking distribution. HEENT:  He is edentulous and has dentures. NECK:  Supple without thyromegaly, adenopathy, or bruits. CARDIAC:  Regular rate and rhythm.  Normal S1 and S2.  There is a 2/6 systolic murmur, heard best at the right upper sternal border, it is a harsh high-pitched murmur. LUNGS:  Clear with equal breath sounds bilaterally. ABDOMEN:  Obese, soft, nontender.  Has normal Allen test on the left side. EXTREMITIES:  Lower extremities have edema below the knees bilaterally, left greater than right.  LABORATORY  DATA:  Cardiac catheterization reviewed, findings as previously noted.  IMPRESSION:  [name] [name] is a 55 year old diabetic gentleman with 3- vessel coronary artery disease which is symptomatic with preserved left ventricular function.  Coronary artery bypass grafting is indicated for survival benefit as well as relief of symptoms.  He has a diagnosis of mild aortic stenosis but did not have a significant gradient at catheterization.  We will plan to assess the valve with intraoperative TEE, but I doubt that it would be diseased enough to warrant an aortic valve replacement.  He is yang and has a tight ostial stenosis in the right coronary.  He is not a candidate for bilateral mammaries, but I do think he might benefit from a left radial in addition to his left mammary.  We would then also do endoscopic vein to graft obtuse marginal 3 and 4.  I discussed in detail with Mr. and Mrs. Arata the indications, risks, benefits, and alternative treatments.  They understand the indications for surgery.  They understand the nature of the operation, incision to be used, expected hospital stay, and overall recovery.  I did discuss with him the risks and benefits.  They understand the risks include but not limited to death, stroke, MI, DVT, PE, bleeding, possible need for transfusions, infections as well as other organ system dysfunction including respiratory, renal, hepatic, or GI complications as well as cardiac arrhythmias as well as the possibility of unforeseeable complications.  He understands and accepts these risks and agrees to proceed.  We will schedule him for surgery on Wednesday, September 06, 2010.  He would be admitted on the day of surgery.     Revonda Standard Roxan Hockey, M.D.     SCH/MEDQ  D:  08/31/2010  T:  09/01/2010  Job:  [id number]  cc:   [name], MD Jackolyn Confer, MD  Electronically Signed by Modesto Charon M.D. on 09/26/2010 12:23:02 PM Electronically Signed  by Modesto Charon M.D. on 09/26/2010 12:43:59 PM Electronically Signed by Modesto Charon M.D. on 09/26/2010 01:02:06 PM Electronically Signed by Modesto Charon M.D. on 09/26/2010 01:21:52 PM Electronically Signed by Modesto Charon M.D. on 09/26/2010 01:43:29 PM Electronically Signed by Modesto Charon M.D. on 09/26/2010 02:03:22 PM Electronically Signed by Modesto Charon M.D. on 09/26/2010 02:23:42 PM Electronically Signed by Modesto Charon M.D. on 09/26/2010 02:46:27 PM Electronically Signed by Modesto Charon M.D. on 09/26/2010 03:10:05 PM Electronically Signed by Modesto Charon M.D. on 09/26/2010 03:34:51 PM Electronically Signed by Modesto Charon M.D. on 09/26/2010 04:09:59 PM Electronically Signed by Modesto Charon M.D. on 09/26/2010 04:41:16 PM Electronically Signed by Modesto Charon M.D. on 09/26/2010 05:13:35 PM Electronically Signed by Modesto Charon M.D. on 09/26/2010 05:13:35 PM Electronically Signed by Modesto Charon M.D. on 09/26/2010 05:43:23 PM Electronically Signed by Modesto Charon M.D. on 09/26/2010 07:32:31 PM

## 2010-09-27 NOTE — Assessment & Plan Note (Signed)
OFFICE VISIT  KEEN, BOSO DOB:  [birth year]                                       09/26/2010 [id number]  This patient returns for initial follow-up regarding his combined left carotid endarterectomy and coronary artery bypass grafting procedure which I performed with Remo Lipps C. Roxan Hockey on February 15.  The patient has a history of left brain TIAs with 3 episodes of garbled speech and right upper extremity weakness over the previous 6 months and an 80%  left internal carotid stenosis.  He has mild right internal carotid occlusive disease.  He was taking Plavix while having these TIAs.  He has done well since his carotid surgery with no recurrent neurologic symptoms such as aphasia or right hemiparesis or visual disturbances.  He does have chronic Bell's palsy with left facial weakness.  His main complaint is discomfort in the left wrist from where the radial artery was harvested.  PHYSICAL EXAMINATION:  Today, blood pressure 142/91, heart rate 100, respirations 20.  Left neck incision has healed nicely.  He has some mild swelling which is resolving.  He has chronic left facial weakness as described from previous Bell's palsy.  No other neurologic abnormalities are noted.  He has a 3+ carotid pulse.  No bruits audible bilaterally.  I think in general he is getting along nicely from his carotid surgery and will return in 6 months with a carotid duplex exam in our office. He will continue taking 1 aspirin daily and if he has any neurologic symptoms, he will be in touch with me at that time.    Nelda Severe Kellie Simmering, M.D. Electronically Signed  JDL/MEDQ  D:  09/26/2010  T:  09/27/2010  Job:  [id number]  cc:   Revonda Standard. Roxan Hockey, M.D.

## 2010-09-28 ENCOUNTER — Ambulatory Visit (INDEPENDENT_AMBULATORY_CARE_PROVIDER_SITE_OTHER): Payer: Medicare Other | Admitting: Cardiovascular Disease

## 2010-09-28 DIAGNOSIS — I10 Essential (primary) hypertension: Secondary | ICD-10-CM

## 2010-09-28 DIAGNOSIS — E7800 Pure hypercholesterolemia, unspecified: Secondary | ICD-10-CM

## 2010-09-28 DIAGNOSIS — I2510 Atherosclerotic heart disease of native coronary artery without angina pectoris: Secondary | ICD-10-CM

## 2010-09-28 DIAGNOSIS — I4891 Unspecified atrial fibrillation: Secondary | ICD-10-CM

## 2010-10-04 ENCOUNTER — Other Ambulatory Visit: Payer: Self-pay | Admitting: Thoracic Surgery (Cardiothoracic Vascular Surgery)

## 2010-10-04 ENCOUNTER — Ambulatory Visit
Admission: RE | Admit: 2010-10-04 | Discharge: 2010-10-04 | Disposition: A | Discharge status: Home / Self Care | Payer: Medicare Other | Source: Ambulatory Visit | Attending: Thoracic Surgery (Cardiothoracic Vascular Surgery) | Admitting: Thoracic Surgery (Cardiothoracic Vascular Surgery)

## 2010-10-04 ENCOUNTER — Encounter (INDEPENDENT_AMBULATORY_CARE_PROVIDER_SITE_OTHER): Payer: Self-pay | Admitting: Thoracic Surgery (Cardiothoracic Vascular Surgery)

## 2010-10-04 DIAGNOSIS — I2510 Atherosclerotic heart disease of native coronary artery without angina pectoris: Secondary | ICD-10-CM

## 2010-10-13 NOTE — Assessment & Plan Note (Signed)
[facility] OFFICE NOTE  NAME:NEELEYTylin, [name]                       MRN:          [id number] DATE:09/28/2010                            DOB:          Dec 19, 1955   HISTORY OF PRESENT ILLNESS:  [name] [name] is a 55 year old gentleman who is here today for a followup visit.  He has the following problem list: 1. Coronary artery disease status post recent coronary artery bypass     graft surgery on September 06, 2010, by Dr. Roxan Hockey.  He had a     LIMA to LAD, left radial to RCA, sequential SVG to OM III an OM IV.     Cardiac catheterization before that showed significant 80% in-stent     restenosis and proximal LAD, 80% ostial RCA and 80 and 90% stenosis     in OM III and IV.  Ejection fraction was 50%. 2. Severe left carotid stenosis which was found incidentally before     coronary artery bypass graft surgery.  He underwent left carotid     endarterectomy by Dr. Kellie Simmering before his bypass surgery. 3. Peripheral vascular disease status post multiple percutaneous     intervention on his iliac and SFA by Dr. Ronalee Belts. 4. Type 2 diabetes. 5. Mild aortic stenosis. 6. Hyperlipidemia. 7. Hypertension. 8. Tobacco use, quit recently. 9. Depression. 10.Psoriasis. 11.Neuropathy. 12.Obesity. 13.Status post venectomy.  INTERIM HISTORY:  [name] [name] underwent cardiac catheterization by me which showed significant in-stent restenosis and progression of his native coronary artery disease.  Thus I referred him for coronary artery bypass graft surgery.  It was done after he underwent left carotid endarterectomy.  His postoperative course was complicated by delirium but overall he has done very well.  He has no anginal type of chest discomfort.  His dyspnea has improved significantly.  He did have postoperative atrial fibrillation which was treated with amiodarone.  MEDICATIONS: 1. Cymbalta 60 mg once daily. 2. Diazepam 2  mg 3 times a daily. 3. Insulin Lantus 50 units once daily. 4. Lipitor 40 mg once daily. 5. Metoprolol 50 mg twice daily. 6. Amiodarone 400 mg once daily. 7. Furosemide 40 mg once daily which will be stopped today. 8. Isosorbide 30 mg once daily. 9. Aspirin 325 mg once daily. 10.Tramadol as needed for discomfort.  PHYSICAL EXAMINATION:  VITAL SIGNS:  Weight is 250.8 pounds, blood pressure is 135/83, pulse is 96, oxygen saturation is 95% on room air. NECK:  Reveals no JVD or carotid bruits.  There is a scar on the left side. LUNGS:  Clear to auscultation. HEART:  There is surgical scar from his bypass surgery which is healing nicely.  Auscultation reveals normal S1 and S2 with no gallops.  There is 2/6 systolic ejection murmur at the aortic area which is early peaking. ABDOMEN:  Benign, nontender, and nondistended. EXTREMITIES:  With only trace edema bilaterally.  The left radial area seems to be healing nicely as well.  IMPRESSION: 1. Coronary artery disease status post recent coronary artery bypass     graft surgery.  The patient had aggressive progression of his     atherosclerosis  over 1 year.  He has been doing well since his     coronary artery bypass graft surgery.  Due to his multiple vascular     and cardiac issues.  I think he will benefit from long-term     treatment with Plavix.  Thus I will go ahead and resume this at 75     mg once daily.  He will continue aspirin as well which likely can     be decreased to 81 mg at some point.  We will continue with     metoprolol 50 mg twice daily. 2. Postoperative atrial fibrillation:  We will plan on keeping him on     amiodarone until the end of March.  He has no evidence of fluid     overload today and will go ahead and stop furosemide. 3. Hyperlipidemia.  We will continue treatment with Lipitor.  We will     plan on getting fasting lipid profile.  LDL target should be less     than 70.  The patient was advised to undergo  cardiac rehab, but he     is not sure if he will be able to.  I had a prolonged discussion     with him about the importance of lifestyle changes including diet     and weight loss.  He seems to be much more motivated than before.     He will follow up in 3 months from now or earlier as needed.    [name], MD Electronically Signed   MA/MedQ  DD: 09/28/2010  DT: 09/29/2010  Job #: [id number]  cc:   Dr. Roxan Hockey

## 2010-10-19 NOTE — Procedures (Signed)
[name] [name], [name] [name]              ACCOUNT NO.:  [id number]  MEDICAL RECORD NO.:  [id number]           PATIENT TYPE:  O  LOCATION:  [id number]                         FACILITY:  [facility]  PHYSICIAN:  [name], MD     DATE OF BIRTH:  [birth year]  DATE OF PROCEDURE:  08/30/2010 DATE OF DISCHARGE:  08/30/2010                           CARDIAC CATHETERIZATION   PRIMARY CARE PHYSICIAN:  Anderson Malta A. Gilford Rile, MD in [place].  PROCEDURES PERFORMED: 1. Left heart catheterization 2. Coronary angiography. 3. Left ventricular angiography.  DIAGNOSES AND CLINICAL HISTORY:  Mr. Dahle is a 55 year old gentleman with known history of coronary artery disease.  He had cardiac catheterization done in January 2011, which showed a 95% proximal LAD stenosis, which was diffusely calcified.  He also was found to have moderate disease in the circumflex and ostial RCA at that time.  He underwent an angioplasty and three overlapping drug-eluting stent placement to the proximal LAD.  He did well over the last year. However, he presented to the outpatient clinic with progressive symptoms of chest pain, dyspnea, and generalized fatigue.  He underwent an evaluation with a Lexiscan nuclear stress test, which showed no convincing evidence of ischemia, but there was a drop in his ejection fraction to 40%.  Due to his known previous history of coronary artery disease, I felt that there was a possibility of balanced ischemia. Thus, cardiac catheterization was recommended.  Risks, benefits, and alternatives were discussed with the patient.  ACCESS:  Right radial artery.  STUDY DETAILS:  A standard informed consent was obtained.  He was given Versed and fentanyl for sedation.  The right radial area was prepped in a sterile fashion.  It was anesthetized with 1% lidocaine.  A 5-French sheath was placed in the right radial artery after an anterior puncture. Verapamil 3 mg was given through the sheath.  Heparin 5000  units was given intravenously.  Coronary angiography was performed with a JR-4, JL- 3.5, and a pigtail catheter.  All catheter exchanges were done over the wire.  The patient tolerated the procedure well with no immediate complications.  The sheath was then removed, and a TR band was applied.  STUDY FINDINGS:  Hemodynamic findings:  Left ventricular pressure was 147/7 with the left ventricular end-diastolic pressure of 16 mmHg. Aortic pressure was 145/80 with a mean pressure of 106 mmHg.  There was minimal gradient across the aortic valve.  CORONARY ANGIOGRAPHY:  Left main coronary artery:  The vessel was normal in size with 20% distal stenosis. Left anterior descending artery:  The vessel was normal in size.  It was moderately to severely calcified in the proximal segments.  Overlap stents were noted.  An 80% in-stent restenosis was noted in the proximal LAD, but in the mid segment of the placed stents.  The rest of the LAD was free of significant obstructive disease.  First diagonal was very small in size.  Second and third diagonals were medium to small size vessels.  Collateral from the distal LAD was noted to the right posterolateral branch. Left circumflex artery:  The vessel was normal in size.  There was a 50%  mid stenosis after OM-2.  OM-1 and OM-2 are normal sized vessels with mild atherosclerosis in the proximal segment without obstructive disease.  OM-3 was normal in size with 80-90% lesion proximally.  There is a left posterolateral branch that has a 90% stenosis as well. Right coronary artery:  The vessel was normal in size and dominant.  It was mildly to moderately calcified.  There was significant pressure damping upon engagement of the catheter, and the right coronary artery likely due to the significance of the ostial stenosis.  70-80% ostial stenosis was noted followed by an ulcerated plaque in that area.  The rest of the right coronary artery has mild diffuse  atherosclerosis.  The PDA and posterolateral branches are normal in size.  The first posterolateral branch seems to be occluded distally likely from embolization with faint collaterals coming from the distal LAD.  CONCLUSION: 1. Significant three-vessel coronary artery disease with in-stent     restenosis in the proximal LAD stents.  There has been progression     of his circumflex as well as right coronary artery disease over the     last year. 2. Normal LV systolic function. 3. Mildly elevated left ventricular end-diastolic pressure.  RECOMMENDATIONS:  I recommend coronary artery bypass graft surgery due to three-vessel coronary artery disease as well as history of diabetes and in-stent restenosis in the proximal LAD.  The patient will be taken off Plavix.  The surgery can be done after he is off Plavix for 1 week. Please note that his other past medical history include hypertension, hyperlipidemia, and peripheral arterial disease.     [name], MD     MA/MEDQ  D:  08/30/2010  T:  08/30/2010  Job:  [id number]  cc:   Anderson Malta A. Gilford Rile, MD  Electronically Signed by [name] MD on 10/19/2010 08:42:30 AM

## 2010-10-23 ENCOUNTER — Encounter (INDEPENDENT_AMBULATORY_CARE_PROVIDER_SITE_OTHER): Payer: Self-pay | Admitting: Thoracic Surgery (Cardiothoracic Vascular Surgery)

## 2010-10-23 DIAGNOSIS — I2510 Atherosclerotic heart disease of native coronary artery without angina pectoris: Secondary | ICD-10-CM

## 2010-10-23 DIAGNOSIS — T8140XA Infection following a procedure, unspecified, initial encounter: Secondary | ICD-10-CM

## 2010-10-24 NOTE — Assessment & Plan Note (Signed)
OFFICE VISIT  DOMENIC, DISTASIO DOB:  14-Sep-1955                                        October 23, 2010 CHART #:  [id number]  REASON FOR VISIT:  To check on arm incision.  HISTORY OF PRESENT ILLNESS:  The patient is a 55 year old gentleman who had coronary bypass grafting x4 on September 06, 2010.  A left radial artery was used.  He was seen in the office on October 04, 2010, at which time he was doing well, but there was some eschar on his left arm incision.  Advised him to watch that.  He has been putting vitamin E oil on it ever since.  He does not have any pain there, but it is not healed any since his last visit.  PHYSICAL EXAMINATION:  GENERAL:  The patient is a 55 year old gentleman in no acute distress.  VITAL SIGNS:  Blood pressure is 115/74, pulse 64, respirations are 16, ox saturation 90% on room air.  CHEST:  Sternal incision is clean, dry, and intact.  EXTREMITIES:  Left arm incision has eschar along approximately two-thirds of the length the incision.  This was debrided.  The underlying tissue is granulating and healthy.  IMPRESSION:  Superficial dehiscence of the left radial artery incision. The eschar was debrided.  We will do wet-to-dry dressing changes.  I will plan to have him seen back in the office next week to check on the status of the wound.  We will instruct his wife on how to do the dressing changes while they are here today.  Revonda Standard Roxan Hockey, M.D. Electronically Signed  SCH/MEDQ  D:  10/23/2010  T:  10/24/2010  Job:  [id number]  cc:   [name], MD Nelda Severe. Kellie Simmering, M.D. Eduard Clos. Gilford Rile, MD

## 2010-10-30 ENCOUNTER — Ambulatory Visit (INDEPENDENT_AMBULATORY_CARE_PROVIDER_SITE_OTHER): Payer: Self-pay

## 2010-10-30 DIAGNOSIS — I2510 Atherosclerotic heart disease of native coronary artery without angina pectoris: Secondary | ICD-10-CM

## 2010-10-30 NOTE — Assessment & Plan Note (Signed)
OFFICE VISIT  DAEDRIC, BONWELL DOB:  March 19, 1956                                        October 04, 2010 CHART #:  [id number]  The patient is a 55 year old gentleman who returns for postoperative followup visit.  He had a combined left carotid endarterectomy and coronary artery bypass grafting on September 06, 2010.  Dr. Kellie Simmering did the carotid and I did the bypass surgery.  Postoperatively, he had some atrial fibrillation and was started on amiodarone.  He was discharged in sinus rhythm.  He says that he has been doing well.  He is still taking pain medications occasionally, sometimes once a day, sometimes twice daily.  He has been using Ultram for that.  He has been doing some walking on his own.  He does not want to go to the [facility] and he has some questions about a separate rehab that he is going to discuss with Dr. Fletcher Anon.  He saw Dr. Kellie Simmering last week and has an appointment in 6 months to repeat his carotid duplex.  CURRENT MEDICATIONS: 1. Aspirin 325 mg daily. 2. Diazepam 2 mg t.i.d. and 5 mg nighty. 3. Lantus insulin 50 units subcu nightly. 4. Lipitor 40 mg daily. 5. Metoprolol 50 mg b.i.d. 6. Omega-3 fish oil. 7. Plavix 75 mg daily. 8. Amiodarone 400 mg daily. 9. Ultram 50 mg p.r.n. 10.Cymbalta 60 mg daily. 11.Magnesium 250 mg b.i.d.  PHYSICAL EXAMINATION:  General:  The patient is an obese 55 year old white male, in no acute distress.  Vital Signs: Blood pressure is 121/75, pulse 90, respirations 18.  His oxygen saturation is 98% on room air.  Lungs:  Clear with equal breath sounds bilaterally.  Cardiac:  He has regular rate and rhythm.  Normal S1 and S2.  No murmurs, rubs, or gallops.  Skin:  His left arm incision has eschar along its length. There is eschar on the lower portion of the sternal incision as well as the chest tube sites.  Eschars at the chest tube sites were debrided, the remaining eschar on the arm and chest  incisions was left in place.  Chest x-ray shows good aeration of the lungs bilaterally.  There is no effusion or infiltrates.  IMPRESSION:  The patient is a 55 year old gentleman who is now about a month out from coronary bypass grafting.  He is really doing well at this point in time from a cardiac standpoint.  There are not any issues. His wounds are healing slowly, but there are no signs of infection.  He does not feel ready to start driving yet and I feel when he does feel up to it, I believe it will be safe but he needs to proceed with caution and limit himself to slow speeds and short trips, and he should not drive if he is having to take pain medication for discomfort.  I strongly encouraged him to participate in regular structure exercise program.  He has had some issues, I guess, with the [place] and wants to do it through an alternate route.  It does not really matter how he does as long as he does it, but I asked him to talk to Dr. Tyrell Antonio office about this other cardiac rehab program that he has information on.  He will continue to be followed by Dr. Fletcher Anon and Dr. Gilford Rile.  I will be happy to see him back anytime if I can be of any further assistance with his care.  Revonda Standard Roxan Hockey, M.D. Electronically Signed  SCH/MEDQ  D:  10/04/2010  T:  10/05/2010  Job:  [id number]  cc:   [name], MD Dr. Ronette Deter in [facility] D. Kellie Simmering, M.D.

## 2010-10-31 NOTE — Assessment & Plan Note (Signed)
OFFICE VISIT  RANSOM, MCQUAIDE DOB:  [birth year]                                        October 30, 2010 CHART #:  [id number]  HISTORY:  We are seeing Mr. Stueber again on today's date in routine office visit followup to reevaluate the superficial dehiscence related to his left radial artery harvest.  Currently, he reports that he continues to feel better.  He is continued with wet-to-dry dressing changes.  There is scant amount of greenish drainage he sees when he does change the dressing.  There is no associated flocculence with the wound.  There is no erythema.  There is only minimal tenderness with manipulation.  PHYSICAL EXAMINATION:  Vital Signs:  Blood pressure 126/78, pulse 78, respirations 18, and oxygen saturation is 98% on room air.  General Appearance:  A well-developed adult obese male in no acute distress. Skin:  The left arm incision is inspected.  The dehiscence is at the lower midportion of the wound.  There is no obvious purulence.  There is no significant erythema.  It is mildly tender to palpation.  ASSESSMENT:  Superficial dehiscence of the left arm radial artery harvest incision.  PLAN:  Plan at this time will be continue wet-to-dry dressing changes as he has been doing.  He is planning to go out of the town in the next couple of weeks as his mother is having a pacemaker placed in [place]. Therefore, we will see him again next week to reevaluate the incision and healing.  John Giovanni, P.A.-C.  Loren Racer  D:  10/30/2010  T:  10/31/2010  Job:  [id number]  cc:   [name], MD Nelda Severe. Kellie Simmering, M.D. Ronette Deter, M.D.

## 2010-11-06 ENCOUNTER — Ambulatory Visit (INDEPENDENT_AMBULATORY_CARE_PROVIDER_SITE_OTHER): Payer: Self-pay

## 2010-11-06 DIAGNOSIS — T8140XA Infection following a procedure, unspecified, initial encounter: Secondary | ICD-10-CM

## 2010-11-06 DIAGNOSIS — I2510 Atherosclerotic heart disease of native coronary artery without angina pectoris: Secondary | ICD-10-CM

## 2010-11-08 LAB — HM DIABETES EYE EXAM

## 2010-12-05 ENCOUNTER — Ambulatory Visit: Payer: Medicare Other | Admitting: Vascular Surgery

## 2010-12-28 ENCOUNTER — Ambulatory Visit (INDEPENDENT_AMBULATORY_CARE_PROVIDER_SITE_OTHER): Payer: Medicare Other | Admitting: Cardiovascular Disease

## 2010-12-28 ENCOUNTER — Encounter: Payer: Self-pay | Admitting: Cardiovascular Disease

## 2010-12-28 DIAGNOSIS — E785 Hyperlipidemia, unspecified: Secondary | ICD-10-CM

## 2010-12-28 DIAGNOSIS — E782 Mixed hyperlipidemia: Secondary | ICD-10-CM | POA: Insufficient documentation

## 2010-12-28 DIAGNOSIS — I10 Essential (primary) hypertension: Secondary | ICD-10-CM

## 2010-12-28 DIAGNOSIS — I2510 Atherosclerotic heart disease of native coronary artery without angina pectoris: Secondary | ICD-10-CM | POA: Insufficient documentation

## 2010-12-28 MED ORDER — NITROGLYCERIN 0.4 MG SL SUBL: 0.4000 mg | SUBLINGUAL_TABLET | SUBLINGUAL | Status: DC | PRN

## 2010-12-28 NOTE — Assessment & Plan Note (Signed)
The patient seems to be doing reasonably well from a cardiac standpoint. He had a few episodes of chest pain. He seems to be more active than before and has maintained his weight loss. Continue treatment with aspirin and Plavix. I will also go ahead and provide him with sublingual nitroglycerin to be used as needed. I gave him instructions about the proper use. We'll try to enroll the patient again in cardiac rehabilitation.

## 2010-12-28 NOTE — Assessment & Plan Note (Signed)
His blood pressure is slightly elevated. The patient will likely benefit from an ACE inhibitor or an ARB but I don't have his most recent labs. Would consider that up and follow up.

## 2010-12-28 NOTE — Assessment & Plan Note (Signed)
Is being managed by his primary care physician Dr. Gilford Rile. He was switched from atorvastatin simvastatin due to what it seems to be cost reasons. I think down the [place], we'll switch the patient back to a more potent statin due to his aggressive atherosclerosis.

## 2010-12-28 NOTE — Patient Instructions (Signed)
Your physician recommends that you schedule a follow-up appointment in: 6 months  Your physician has recommended you make the following change in your medication: NitroStat 0.4 mg  Take as directed

## 2010-12-28 NOTE — Progress Notes (Signed)
HPI  [name] is a 55 year old gentleman who is here today for followup visit. He is status post coronary artery bypass graft surgery as well as carotid endarterectomy in February of this year. He has been doing reasonably well. He underwent angioplasty on his right SFA recently by Dr. Delana Meyer. He seems to be overall stable from a cardiac standpoint. He had 3 episodes of chest pain since his bypass surgery which is described as burning sensation. It happened at rest. He has been doing his activities of daily living without exertional symptoms. He does not have nitroglycerin at home.  Allergies  Allergen Reactions  . Penicillins      Current Outpatient Prescriptions on File Prior to Visit  Medication Sig Dispense Refill  . cholecalciferol (VITAMIN D) 1000 UNITS tablet Take 1,000 Units by mouth daily.        . clopidogrel (PLAVIX) 75 MG tablet Take 75 mg by mouth daily.        . diazepam (VALIUM) 2 MG tablet Take 2 mg by mouth 3 (three) times daily.        . diazepam (VALIUM) 5 MG tablet Take 5 mg by mouth daily.        . insulin glargine (LANTUS) 100 UNIT/ML injection Inject 50 Units into the skin at bedtime.        . metoprolol tartrate (LOPRESSOR) 25 MG tablet Take 25 mg by mouth 2 (two) times daily.        [name] DISCONTD: aspirin 81 MG tablet Take 81 mg by mouth daily.        [name] DISCONTD: atorvastatin (LIPITOR) 40 MG tablet Take 40 mg by mouth daily.        [name] DISCONTD: desvenlafaxine (PRISTIQ) 50 MG 24 hr tablet Take 50 mg by mouth daily.        [name] DISCONTD: DULoxetine (CYMBALTA) 30 MG capsule Take 30 mg by mouth daily.        [name] DISCONTD: fentaNYL (DURAGESIC - DOSED MCG/HR) 50 MCG/HR Place 1 patch onto the skin every 3 (three) days.        [name] DISCONTD: fish oil-omega-3 fatty acids 1000 MG capsule Take 1 g by mouth daily.        [name] DISCONTD: oxyCODONE-acetaminophen (PERCOCET) 5-325 MG per tablet Take 1 tablet by mouth 2 (two) times daily.           Past Medical History  Diagnosis Date  .  Peripheral vascular disease   . Diabetes mellitus   . Mild aortic stenosis   . Tobacco use disorder     recently quit  . Depression   . Psoriasis   . Neuropathy   . Obesity   . Post splenectomy syndrome   . SOB (shortness of breath)   . Carotid artery occlusion   . Coronary artery disease   . Hyperlipidemia   . Hypertension      Past Surgical History  Procedure Date  . Splenectomy   . Heart stents Jan 2011    leg stents 06/2009 and 03/2010  . Coronary artery bypass graft 09/06/2010    At [facility]: LIMA to LAD, left radial to RCA, sequential SVG to OM3 and 4  . Cardiac catheterization 08/2010    LAD: 80% ISR, RCA: 80% ostial, OM 80-90%  . Coronary angioplasty     LAD: before CABG  . Carotid endarterectomy 08/2010    left/ Dr. Kellie Simmering  . Pta of illiac and sfa multiple    Dr. Ronalee Belts  Family History  Problem Relation Age of Onset  . Cancer Father 1  . Cancer Sister 90     History   Social History  . Marital Status: Married    Spouse Name: N/A    Number of Children: N/A  . Years of Education: N/A   Occupational History  . Not on file.   Social History Main Topics  . Smoking status: Former Smoker    Types: Cigarettes    Quit date: 12/25/2010  . Smokeless tobacco: Not on file  . Alcohol Use: No  . Drug Use: Yes  . Sexually Active: Not on file   Other Topics Concern  . Not on file   Social History Narrative  . No narrative on file       PHYSICAL EXAM   BP 143/81  Pulse 57  Ht 5\' 5"  (1.651 m)  Wt 251 lb (113.853 kg)  BMI 41.77 kg/m2  SpO2 99%  Constitutional: He is oriented to person, place, and time. He appears well-developed and well-nourished. No distress.  HENT: No nasal discharge.  Head: Normocephalic and atraumatic.  Eyes: Pupils are equal, round, and reactive to light. Right eye exhibits no discharge. Left eye exhibits no discharge.  Neck: Normal range of motion. Neck supple. No JVD present. No thyromegaly present. A left surgical  scar from endarterectomy. Cardiovascular: Normal rate, regular rhythm, normal heart sounds and intact distal pulses. Exam reveals no gallop and no friction rub.  There is a 2/6 systolic ejection murmur in the aortic area.  Pulmonary/Chest: Effort normal and breath sounds normal. No stridor. No respiratory distress. He has no wheezes. He has no rales. He exhibits no tenderness.  Abdominal: Soft. Bowel sounds are normal. He exhibits no distension. There is no tenderness. There is no rebound and no guarding.  Musculoskeletal: Normal range of motion. He exhibits no edema and no tenderness.  Neurological: He is alert and oriented to person, place, and time. Coordination normal.  Skin: Skin is warm and dry. There is extensive psoriasis.[name] He is not diaphoretic. No erythema. No pallor.  Psychiatric: He has a normal mood and affect. His behavior is normal. Judgment and thought content normal.        ASSESSMENT AND PLAN

## 2011-01-08 ENCOUNTER — Other Ambulatory Visit: Payer: Self-pay | Admitting: Cardiovascular Disease

## 2011-01-08 ENCOUNTER — Encounter (HOSPITAL_COMMUNITY)
Admission: RE | Admit: 2011-01-08 | Discharge: 2011-01-08 | Disposition: A | Discharge status: Home / Self Care | Payer: Medicare Other | Source: Ambulatory Visit | Attending: Cardiovascular Disease | Admitting: Cardiovascular Disease

## 2011-01-08 DIAGNOSIS — Z951 Presence of aortocoronary bypass graft: Secondary | ICD-10-CM | POA: Insufficient documentation

## 2011-01-08 DIAGNOSIS — Z7982 Long term (current) use of aspirin: Secondary | ICD-10-CM | POA: Insufficient documentation

## 2011-01-08 DIAGNOSIS — F329 Major depressive disorder, single episode, unspecified: Secondary | ICD-10-CM | POA: Insufficient documentation

## 2011-01-08 DIAGNOSIS — Z794 Long term (current) use of insulin: Secondary | ICD-10-CM | POA: Insufficient documentation

## 2011-01-08 DIAGNOSIS — G609 Hereditary and idiopathic neuropathy, unspecified: Secondary | ICD-10-CM | POA: Insufficient documentation

## 2011-01-08 DIAGNOSIS — Z5189 Encounter for other specified aftercare: Secondary | ICD-10-CM | POA: Insufficient documentation

## 2011-01-08 DIAGNOSIS — Z8673 Personal history of transient ischemic attack (TIA), and cerebral infarction without residual deficits: Secondary | ICD-10-CM | POA: Insufficient documentation

## 2011-01-08 DIAGNOSIS — I080 Rheumatic disorders of both mitral and aortic valves: Secondary | ICD-10-CM | POA: Insufficient documentation

## 2011-01-08 DIAGNOSIS — Z880 Allergy status to penicillin: Secondary | ICD-10-CM | POA: Insufficient documentation

## 2011-01-08 DIAGNOSIS — K219 Gastro-esophageal reflux disease without esophagitis: Secondary | ICD-10-CM | POA: Insufficient documentation

## 2011-01-08 DIAGNOSIS — I2510 Atherosclerotic heart disease of native coronary artery without angina pectoris: Secondary | ICD-10-CM | POA: Insufficient documentation

## 2011-01-08 DIAGNOSIS — F3289 Other specified depressive episodes: Secondary | ICD-10-CM | POA: Insufficient documentation

## 2011-01-08 DIAGNOSIS — I4891 Unspecified atrial fibrillation: Secondary | ICD-10-CM | POA: Insufficient documentation

## 2011-01-08 DIAGNOSIS — G4733 Obstructive sleep apnea (adult) (pediatric): Secondary | ICD-10-CM | POA: Insufficient documentation

## 2011-01-08 DIAGNOSIS — E669 Obesity, unspecified: Secondary | ICD-10-CM | POA: Insufficient documentation

## 2011-01-08 DIAGNOSIS — E119 Type 2 diabetes mellitus without complications: Secondary | ICD-10-CM | POA: Insufficient documentation

## 2011-01-08 DIAGNOSIS — Z9861 Coronary angioplasty status: Secondary | ICD-10-CM | POA: Insufficient documentation

## 2011-01-08 DIAGNOSIS — I6529 Occlusion and stenosis of unspecified carotid artery: Secondary | ICD-10-CM | POA: Insufficient documentation

## 2011-01-08 DIAGNOSIS — I10 Essential (primary) hypertension: Secondary | ICD-10-CM | POA: Insufficient documentation

## 2011-01-08 LAB — GLUCOSE, CAPILLARY
Glucose-Capillary: 194 mg/dL — ABNORMAL HIGH (ref 70–99)
Glucose-Capillary: 211 mg/dL — ABNORMAL HIGH (ref 70–99)

## 2011-01-10 ENCOUNTER — Encounter (HOSPITAL_COMMUNITY): Payer: Medicare Other

## 2011-01-12 ENCOUNTER — Encounter (HOSPITAL_COMMUNITY): Payer: Medicare Other

## 2011-01-15 ENCOUNTER — Other Ambulatory Visit: Payer: Self-pay | Admitting: Cardiovascular Disease

## 2011-01-15 ENCOUNTER — Encounter (HOSPITAL_COMMUNITY): Payer: Medicare Other

## 2011-01-15 LAB — GLUCOSE, CAPILLARY
Glucose-Capillary: 201 mg/dL — ABNORMAL HIGH (ref 70–99)
Glucose-Capillary: 164 mg/dL — ABNORMAL HIGH (ref 70–99)

## 2011-01-17 ENCOUNTER — Other Ambulatory Visit: Payer: Self-pay | Admitting: Cardiovascular Disease

## 2011-01-17 ENCOUNTER — Encounter (HOSPITAL_COMMUNITY): Payer: Medicare Other

## 2011-01-17 LAB — GLUCOSE, CAPILLARY
Glucose-Capillary: 227 mg/dL — ABNORMAL HIGH (ref 70–99)
Glucose-Capillary: 182 mg/dL — ABNORMAL HIGH (ref 70–99)

## 2011-01-19 ENCOUNTER — Other Ambulatory Visit: Payer: Self-pay | Admitting: Cardiovascular Disease

## 2011-01-19 ENCOUNTER — Encounter (HOSPITAL_COMMUNITY): Payer: Medicare Other

## 2011-01-19 LAB — GLUCOSE, CAPILLARY: Glucose-Capillary: 367 mg/dL — ABNORMAL HIGH (ref 70–99)

## 2011-01-22 ENCOUNTER — Encounter (HOSPITAL_COMMUNITY): Payer: Medicare Other

## 2011-01-24 ENCOUNTER — Encounter (HOSPITAL_COMMUNITY): Payer: Medicare Other | Attending: Cardiovascular Disease

## 2011-01-24 DIAGNOSIS — K219 Gastro-esophageal reflux disease without esophagitis: Secondary | ICD-10-CM | POA: Insufficient documentation

## 2011-01-24 DIAGNOSIS — E119 Type 2 diabetes mellitus without complications: Secondary | ICD-10-CM | POA: Insufficient documentation

## 2011-01-24 DIAGNOSIS — Z5189 Encounter for other specified aftercare: Secondary | ICD-10-CM | POA: Insufficient documentation

## 2011-01-24 DIAGNOSIS — I4891 Unspecified atrial fibrillation: Secondary | ICD-10-CM | POA: Insufficient documentation

## 2011-01-24 DIAGNOSIS — I6529 Occlusion and stenosis of unspecified carotid artery: Secondary | ICD-10-CM | POA: Insufficient documentation

## 2011-01-24 DIAGNOSIS — I10 Essential (primary) hypertension: Secondary | ICD-10-CM | POA: Insufficient documentation

## 2011-01-24 DIAGNOSIS — G4733 Obstructive sleep apnea (adult) (pediatric): Secondary | ICD-10-CM | POA: Insufficient documentation

## 2011-01-24 DIAGNOSIS — E669 Obesity, unspecified: Secondary | ICD-10-CM | POA: Insufficient documentation

## 2011-01-24 DIAGNOSIS — F329 Major depressive disorder, single episode, unspecified: Secondary | ICD-10-CM | POA: Insufficient documentation

## 2011-01-24 DIAGNOSIS — Z7982 Long term (current) use of aspirin: Secondary | ICD-10-CM | POA: Insufficient documentation

## 2011-01-24 DIAGNOSIS — Z951 Presence of aortocoronary bypass graft: Secondary | ICD-10-CM | POA: Insufficient documentation

## 2011-01-24 DIAGNOSIS — Z794 Long term (current) use of insulin: Secondary | ICD-10-CM | POA: Insufficient documentation

## 2011-01-24 DIAGNOSIS — G609 Hereditary and idiopathic neuropathy, unspecified: Secondary | ICD-10-CM | POA: Insufficient documentation

## 2011-01-24 DIAGNOSIS — Z880 Allergy status to penicillin: Secondary | ICD-10-CM | POA: Insufficient documentation

## 2011-01-24 DIAGNOSIS — F3289 Other specified depressive episodes: Secondary | ICD-10-CM | POA: Insufficient documentation

## 2011-01-24 DIAGNOSIS — I080 Rheumatic disorders of both mitral and aortic valves: Secondary | ICD-10-CM | POA: Insufficient documentation

## 2011-01-24 DIAGNOSIS — Z8673 Personal history of transient ischemic attack (TIA), and cerebral infarction without residual deficits: Secondary | ICD-10-CM | POA: Insufficient documentation

## 2011-01-24 DIAGNOSIS — Z9861 Coronary angioplasty status: Secondary | ICD-10-CM | POA: Insufficient documentation

## 2011-01-24 DIAGNOSIS — I2510 Atherosclerotic heart disease of native coronary artery without angina pectoris: Secondary | ICD-10-CM | POA: Insufficient documentation

## 2011-01-26 ENCOUNTER — Encounter (HOSPITAL_COMMUNITY): Payer: Medicare Other

## 2011-01-29 ENCOUNTER — Encounter (HOSPITAL_COMMUNITY): Payer: Medicare Other

## 2011-01-29 ENCOUNTER — Other Ambulatory Visit: Payer: Self-pay | Admitting: Cardiovascular Disease

## 2011-01-29 LAB — GLUCOSE, CAPILLARY: Glucose-Capillary: 378 mg/dL — ABNORMAL HIGH (ref 70–99)

## 2011-01-31 ENCOUNTER — Encounter (HOSPITAL_COMMUNITY): Payer: Medicare Other

## 2011-02-02 ENCOUNTER — Encounter (HOSPITAL_COMMUNITY): Payer: Medicare Other

## 2011-02-02 ENCOUNTER — Other Ambulatory Visit: Payer: Self-pay | Admitting: Cardiovascular Disease

## 2011-02-02 ENCOUNTER — Encounter: Payer: Self-pay | Admitting: Cardiovascular Disease

## 2011-02-05 ENCOUNTER — Encounter (HOSPITAL_COMMUNITY): Payer: Medicare Other

## 2011-02-05 LAB — GLUCOSE, CAPILLARY
Glucose-Capillary: 243 mg/dL — ABNORMAL HIGH (ref 70–99)
Glucose-Capillary: 161 mg/dL — ABNORMAL HIGH (ref 70–99)

## 2011-02-07 ENCOUNTER — Encounter (HOSPITAL_COMMUNITY): Payer: Medicare Other

## 2011-02-09 ENCOUNTER — Encounter (HOSPITAL_COMMUNITY): Payer: Medicare Other

## 2011-02-12 ENCOUNTER — Encounter (HOSPITAL_COMMUNITY): Payer: Medicare Other

## 2011-02-14 ENCOUNTER — Encounter (HOSPITAL_COMMUNITY): Payer: Medicare Other

## 2011-02-14 ENCOUNTER — Other Ambulatory Visit: Payer: Self-pay | Admitting: Cardiovascular Disease

## 2011-02-14 LAB — GLUCOSE, CAPILLARY
Glucose-Capillary: 124 mg/dL — ABNORMAL HIGH (ref 70–99)
Glucose-Capillary: 180 mg/dL — ABNORMAL HIGH (ref 70–99)

## 2011-02-16 ENCOUNTER — Encounter (HOSPITAL_COMMUNITY): Payer: Medicare Other

## 2011-02-19 ENCOUNTER — Encounter (HOSPITAL_COMMUNITY): Payer: Medicare Other

## 2011-02-21 ENCOUNTER — Encounter (HOSPITAL_COMMUNITY): Payer: Medicare Other

## 2011-02-23 ENCOUNTER — Encounter (HOSPITAL_COMMUNITY): Payer: Medicare Other

## 2011-02-26 ENCOUNTER — Encounter (HOSPITAL_COMMUNITY): Payer: Medicare Other

## 2011-02-28 ENCOUNTER — Encounter (HOSPITAL_COMMUNITY): Payer: Medicare Other | Attending: Cardiovascular Disease

## 2011-02-28 DIAGNOSIS — Z5189 Encounter for other specified aftercare: Secondary | ICD-10-CM | POA: Insufficient documentation

## 2011-02-28 DIAGNOSIS — E119 Type 2 diabetes mellitus without complications: Secondary | ICD-10-CM | POA: Insufficient documentation

## 2011-02-28 DIAGNOSIS — I6529 Occlusion and stenosis of unspecified carotid artery: Secondary | ICD-10-CM | POA: Insufficient documentation

## 2011-02-28 DIAGNOSIS — G4733 Obstructive sleep apnea (adult) (pediatric): Secondary | ICD-10-CM | POA: Insufficient documentation

## 2011-02-28 DIAGNOSIS — F3289 Other specified depressive episodes: Secondary | ICD-10-CM | POA: Insufficient documentation

## 2011-02-28 DIAGNOSIS — Z880 Allergy status to penicillin: Secondary | ICD-10-CM | POA: Insufficient documentation

## 2011-02-28 DIAGNOSIS — E669 Obesity, unspecified: Secondary | ICD-10-CM | POA: Insufficient documentation

## 2011-02-28 DIAGNOSIS — F329 Major depressive disorder, single episode, unspecified: Secondary | ICD-10-CM | POA: Insufficient documentation

## 2011-02-28 DIAGNOSIS — I4891 Unspecified atrial fibrillation: Secondary | ICD-10-CM | POA: Insufficient documentation

## 2011-02-28 DIAGNOSIS — I080 Rheumatic disorders of both mitral and aortic valves: Secondary | ICD-10-CM | POA: Insufficient documentation

## 2011-02-28 DIAGNOSIS — Z951 Presence of aortocoronary bypass graft: Secondary | ICD-10-CM | POA: Insufficient documentation

## 2011-02-28 DIAGNOSIS — Z9861 Coronary angioplasty status: Secondary | ICD-10-CM | POA: Insufficient documentation

## 2011-02-28 DIAGNOSIS — Z794 Long term (current) use of insulin: Secondary | ICD-10-CM | POA: Insufficient documentation

## 2011-02-28 DIAGNOSIS — K219 Gastro-esophageal reflux disease without esophagitis: Secondary | ICD-10-CM | POA: Insufficient documentation

## 2011-02-28 DIAGNOSIS — I10 Essential (primary) hypertension: Secondary | ICD-10-CM | POA: Insufficient documentation

## 2011-02-28 DIAGNOSIS — I2510 Atherosclerotic heart disease of native coronary artery without angina pectoris: Secondary | ICD-10-CM | POA: Insufficient documentation

## 2011-02-28 DIAGNOSIS — Z7982 Long term (current) use of aspirin: Secondary | ICD-10-CM | POA: Insufficient documentation

## 2011-02-28 DIAGNOSIS — G609 Hereditary and idiopathic neuropathy, unspecified: Secondary | ICD-10-CM | POA: Insufficient documentation

## 2011-02-28 DIAGNOSIS — Z8673 Personal history of transient ischemic attack (TIA), and cerebral infarction without residual deficits: Secondary | ICD-10-CM | POA: Insufficient documentation

## 2011-03-01 ENCOUNTER — Encounter: Payer: Self-pay | Admitting: Internal Medicine

## 2011-03-02 ENCOUNTER — Encounter (HOSPITAL_COMMUNITY): Payer: Medicare Other

## 2011-03-05 ENCOUNTER — Encounter: Payer: Self-pay | Admitting: Internal Medicine

## 2011-03-05 ENCOUNTER — Other Ambulatory Visit: Payer: Self-pay | Admitting: Cardiovascular Disease

## 2011-03-05 ENCOUNTER — Encounter (HOSPITAL_COMMUNITY): Payer: Medicare Other

## 2011-03-05 ENCOUNTER — Ambulatory Visit (HOSPITAL_COMMUNITY): Payer: Medicare Other | Admitting: Psychiatry

## 2011-03-05 DIAGNOSIS — L409 Psoriasis, unspecified: Secondary | ICD-10-CM

## 2011-03-05 DIAGNOSIS — G629 Polyneuropathy, unspecified: Secondary | ICD-10-CM | POA: Insufficient documentation

## 2011-03-05 DIAGNOSIS — I2510 Atherosclerotic heart disease of native coronary artery without angina pectoris: Secondary | ICD-10-CM

## 2011-03-05 DIAGNOSIS — E1151 Type 2 diabetes mellitus with diabetic peripheral angiopathy without gangrene: Secondary | ICD-10-CM | POA: Insufficient documentation

## 2011-03-05 DIAGNOSIS — G4730 Sleep apnea, unspecified: Secondary | ICD-10-CM

## 2011-03-05 DIAGNOSIS — G4733 Obstructive sleep apnea (adult) (pediatric): Secondary | ICD-10-CM | POA: Insufficient documentation

## 2011-03-05 LAB — GLUCOSE, CAPILLARY: Glucose-Capillary: 130 mg/dL — ABNORMAL HIGH (ref 70–99)

## 2011-03-07 ENCOUNTER — Encounter (HOSPITAL_COMMUNITY): Payer: Medicare Other

## 2011-03-09 ENCOUNTER — Encounter (HOSPITAL_COMMUNITY): Payer: Medicare Other

## 2011-03-12 ENCOUNTER — Encounter (HOSPITAL_COMMUNITY): Payer: Medicare Other

## 2011-03-13 ENCOUNTER — Encounter: Payer: Self-pay | Admitting: Vascular Surgery

## 2011-03-14 ENCOUNTER — Encounter (HOSPITAL_COMMUNITY): Payer: Medicare Other

## 2011-03-15 ENCOUNTER — Ambulatory Visit (INDEPENDENT_AMBULATORY_CARE_PROVIDER_SITE_OTHER): Payer: Medicare Other | Admitting: Internal Medicine

## 2011-03-15 ENCOUNTER — Encounter: Payer: Self-pay | Admitting: Internal Medicine

## 2011-03-15 DIAGNOSIS — L408 Other psoriasis: Secondary | ICD-10-CM

## 2011-03-15 DIAGNOSIS — E119 Type 2 diabetes mellitus without complications: Secondary | ICD-10-CM

## 2011-03-15 DIAGNOSIS — L409 Psoriasis, unspecified: Secondary | ICD-10-CM

## 2011-03-15 DIAGNOSIS — K5900 Constipation, unspecified: Secondary | ICD-10-CM

## 2011-03-15 DIAGNOSIS — I10 Essential (primary) hypertension: Secondary | ICD-10-CM

## 2011-03-15 DIAGNOSIS — Z0000 Encounter for general adult medical examination without abnormal findings: Secondary | ICD-10-CM

## 2011-03-15 DIAGNOSIS — E785 Hyperlipidemia, unspecified: Secondary | ICD-10-CM

## 2011-03-15 DIAGNOSIS — I2510 Atherosclerotic heart disease of native coronary artery without angina pectoris: Secondary | ICD-10-CM

## 2011-03-15 LAB — COMPREHENSIVE METABOLIC PANEL
ALT: 19 U/L (ref 0–53)
AST: 15 U/L (ref 0–37)
Albumin: 3.6 g/dL (ref 3.5–5.2)
Alkaline Phosphatase: 128 U/L — ABNORMAL HIGH (ref 39–117)
BUN: 19 mg/dL (ref 6–23)
CO2: 27 mEq/L (ref 19–32)
Calcium: 8.7 mg/dL (ref 8.4–10.5)
Chloride: 104 mEq/L (ref 96–112)
Creatinine, Ser: 1.2 mg/dL (ref 0.4–1.5)
GFR: 65.59 mL/min (ref >60.00)
Glucose, Bld: 148 mg/dL — ABNORMAL HIGH (ref 70–99)
Potassium: 4.5 mEq/L (ref 3.5–5.1)
Sodium: 136 mEq/L (ref 135–145)
Total Bilirubin: 0.4 mg/dL (ref 0.3–1.2)
Total Protein: 7.2 g/dL (ref 6.0–8.3)

## 2011-03-15 LAB — CBC WITH DIFFERENTIAL/PLATELET
Basophils Absolute: 0 10*3/uL (ref 0.0–0.1)
Basophils Relative: 0.5 % (ref 0.0–3.0)
Eosinophils Absolute: 0.2 10*3/uL (ref 0.0–0.7)
Eosinophils Relative: 2.6 % (ref 0.0–5.0)
HCT: 38.3 % — ABNORMAL LOW (ref 39.0–52.0)
Hemoglobin: 12.8 g/dL — ABNORMAL LOW (ref 13.0–17.0)
Lymphocytes Relative: 26.4 % (ref 12.0–46.0)
Lymphs Abs: 1.9 10*3/uL (ref 0.7–4.0)
MCHC: 33.5 g/dL (ref 30.0–36.0)
MCV: 91.9 fl (ref 78.0–100.0)
Monocytes Absolute: 0.5 10*3/uL (ref 0.1–1.0)
Monocytes Relative: 6.4 % (ref 3.0–12.0)
Neutro Abs: 4.7 10*3/uL (ref 1.4–7.7)
Neutrophils Relative %: 64.1 % (ref 43.0–77.0)
Platelets: 191 10*3/uL (ref 150.0–400.0)
RBC: 4.16 Mil/uL — ABNORMAL LOW (ref 4.22–5.81)
RDW: 16.5 % — ABNORMAL HIGH (ref 11.5–14.6)
WBC: 7.3 10*3/uL (ref 4.5–10.5)

## 2011-03-15 LAB — LIPID PANEL
Cholesterol: 100 mg/dL (ref 0–200)
HDL: 28.3 mg/dL — ABNORMAL LOW (ref >39.00)
Total CHOL/HDL Ratio: 4
Triglycerides: 240 mg/dL — ABNORMAL HIGH (ref 0.0–149.0)
VLDL: 48 mg/dL — ABNORMAL HIGH (ref 0.0–40.0)

## 2011-03-15 LAB — MICROALBUMIN / CREATININE URINE RATIO
Creatinine,U: 71 mg/dL
Microalb Creat Ratio: 12.5 mg/g (ref 0.0–30.0)
Microalb, Ur: 8.9 mg/dL — ABNORMAL HIGH (ref 0.0–1.9)

## 2011-03-15 LAB — LDL CHOLESTEROL, DIRECT: Direct LDL: 47.7 mg/dL

## 2011-03-15 LAB — HEMOGLOBIN A1C: Hgb A1c MFr Bld: 8.1 % — ABNORMAL HIGH (ref 4.6–6.5)

## 2011-03-15 MED ORDER — LACTULOSE 20 GM/30ML PO SOLN: 30.0000 mL | ORAL | Status: DC | PRN

## 2011-03-15 NOTE — Patient Instructions (Signed)
Take lactulose as directed. Call if no bowel movement within next 72hour. Constipation in Adults Constipation is having fewer than 2 bowel movements per week. Usually, the stools are hard. As we grow older, constipation is more common. If you try to fix constipation with laxatives, the problem may get worse. This is because laxatives taken over a long period of time make the colon muscles weaker. A low-fiber diet, not taking in enough fluids, and taking some medicines may make these problems worse. MEDICATIONS THAT MAY CAUSE CONSTIPATION  Water pills (diuretics).  Calcium channel blockers (used to control blood pressure and for the heart).   Certain pain medicines (narcotics).   Anticholinergics.  Anti-inflammatory agents.   Antacids that contain aluminum.   DISEASES THAT CONTRIBUTE TO CONSTIPATION  Diabetes.  Parkinson's disease.   Dementia.   Stroke.  Depression.   Illnesses that cause problems with salt and water metabolism.   HOME CARE INSTRUCTIONS  Constipation is usually best cared for without medicines. Increasing dietary fiber and eating more fruits and vegetables is the best way to manage constipation.   Slowly increase fiber intake to 25 to 38 grams per day. Whole grains, fruits, vegetables, and legumes are good sources of fiber. A dietitian can further help you incorporate high-fiber foods into your diet.   Drink enough water and fluids to keep your urine clear or pale yellow.   A fiber supplement may be added to your diet if you cannot get enough fiber from foods.   Increasing your activities also helps improve regularity.   Suppositories, as suggested by your caregiver, will also help. If you are using antacids, such as aluminum or calcium containing products, it will be helpful to switch to products containing magnesium if your caregiver says it is okay.   If you have been given a liquid injection (enema) today, this is only a temporary measure. It should not be  relied on for treatment of longstanding (chronic) constipation.   Stronger measures, such as magnesium sulfate, should be avoided if possible. This may cause uncontrollable diarrhea. Using magnesium sulfate may not allow you time to make it to the bathroom.  SEEK IMMEDIATE MEDICAL CARE IF:  There is bright red blood in the stool.   The constipation stays for more than 4 days.   There is belly (abdominal) or rectal pain.   You do not seem to be getting better.   You have any questions or concerns.  MAKE SURE YOU:  Understand these instructions.   Will watch your condition.   Will get help right away if you are not doing well or get worse.  Document Released: 04/06/2004 Document Re-Released: 10/03/2009 [facility] Patient Information 2011 [place].

## 2011-03-15 NOTE — Progress Notes (Signed)
Subjective:    Patient ID: [name] [name], male    DOB: [birth year], 55 y.o.   MRN: [id number]  Constipation This is a new problem. The current episode started more than 1 month ago. The problem has been gradually worsening since onset. His stool frequency is 1 time per week or less. The stool is described as firm. The patient is not on a high fiber diet. He does not exercise regularly. There has been adequate water intake. Associated symptoms include abdominal pain and bloating. Pertinent negatives include no difficulty urinating, fever, hematochezia, nausea, rectal pain, vomiting or weight loss. Risk factors include obesity. He has tried laxatives for the symptoms. The treatment provided no relief. His past medical history is significant for endocrine disease.  Diabetes He presents for his follow-up diabetic visit. He has type 2 diabetes mellitus. His disease course has been stable. Hypoglycemia symptoms include nervousness/anxiousness. Pertinent negatives for hypoglycemia include no headaches. Associated symptoms include fatigue and foot paresthesias. Pertinent negatives for diabetes include no blurred vision, no chest pain, no foot ulcerations, no polydipsia, no polyphagia, no polyuria, no visual change, no weakness and no weight loss. Symptoms are stable. Diabetic complications include autonomic neuropathy, heart disease, nephropathy, peripheral neuropathy and PVD. Risk factors for coronary artery disease include diabetes mellitus, dyslipidemia, hypertension, male sex, obesity and sedentary lifestyle. Current diabetic treatment includes diet, oral agent (monotherapy) and insulin injections. He is compliant with treatment most of the time. His weight is increasing steadily. He is following a generally unhealthy diet. When asked about meal planning, he reported none. He participates in exercise three times a week (currently in cardiac rehab). There is no change in his home blood glucose trend. His overall  blood glucose range is 110-130 mg/dl. An ACE inhibitor/angiotensin II receptor blocker is contraindicated.  Hypertension This is a chronic problem. The problem is unchanged. The problem is controlled. Pertinent negatives include no blurred vision, chest pain, headaches, neck pain, palpitations, peripheral edema or shortness of breath. Risk factors for coronary artery disease include diabetes mellitus, family history, dyslipidemia, male gender, obesity, sedentary lifestyle and smoking/tobacco exposure. Past treatments include beta blockers. Compliance problems include diet.  Hypertensive end-organ damage includes angina, kidney disease, CAD/MI and PVD.  Psoriasis This is a recurrent problem. The current episode started more than 1 year ago. The problem occurs intermittently. The problem has been gradually worsening since onset. The lesions are diffuse. Associated symptoms include itching, pain and plaques. The symptoms are aggravated by stress. Past treatments include PUVA and topical steroids. The treatment provided moderate (however has been off medication because of cost) relief.      Review of Systems  Constitutional: Positive for fatigue. Negative for fever, chills, weight loss, activity change, appetite change and unexpected weight change.  HENT: Negative for neck pain.   Eyes: Negative for blurred vision and visual disturbance.  Respiratory: Negative for cough, chest tightness and shortness of breath.   Cardiovascular: Negative for chest pain, palpitations and leg swelling.  Gastrointestinal: Positive for abdominal pain, constipation and bloating. Negative for nausea, vomiting, blood in stool, hematochezia, abdominal distention, anal bleeding and rectal pain.  Genitourinary: Negative for dysuria, urgency, polyuria and difficulty urinating.  Musculoskeletal: Negative for arthralgias and gait problem.  Skin: Positive for itching and rash. Negative for color change.  Neurological: Negative for  weakness and headaches.  Hematological: Negative for polydipsia, polyphagia and adenopathy.  Psychiatric/Behavioral: Positive for dysphoric mood (chronic). Negative for sleep disturbance. The patient is nervous/anxious.  Objective:   Physical Exam  Constitutional: He is oriented to person, place, and time. He appears well-developed and well-nourished. No distress.  HENT:  Head: Normocephalic and atraumatic.  Right Ear: External ear normal.  Left Ear: External ear normal.  Nose: Nose normal.  Mouth/Throat: Oropharynx is clear and moist. No oropharyngeal exudate.  Eyes: Conjunctivae and EOM are normal. Pupils are equal, round, and reactive to light. Right eye exhibits no discharge. Left eye exhibits no discharge. No scleral icterus.  Neck: Normal range of motion. Neck supple. No tracheal deviation present. No thyromegaly present.  Cardiovascular: Normal rate, regular rhythm and normal heart sounds.  Exam reveals no gallop and no friction rub.   No murmur heard. Pulmonary/Chest: Effort normal and breath sounds normal. No respiratory distress. He has no wheezes. He has no rales. He exhibits no tenderness.  Abdominal: He exhibits distension. He exhibits no mass. There is tenderness. There is no rebound and no guarding.  Musculoskeletal: Normal range of motion. He exhibits no edema and no tenderness.  Lymphadenopathy:    He has no cervical adenopathy.  Neurological: He is alert and oriented to person, place, and time. No cranial nerve deficit. Coordination normal.  Skin: Skin is warm and dry. Rash (scaling plaques consistent with psoriasis over arms, legs) noted. He is not diaphoretic. No erythema. No pallor.  Psychiatric: He has a normal mood and affect. His behavior is normal. Judgment and thought content normal.          Assessment & Plan:  1. Constipation - patient with chronic constipation. He reports no bowel movement in the last 2 weeks. He has previously been taking MiraLAX  and [place].  Encouraged him to increase intake of fluid and foods that are high in fiber. We will try lactulose 20 g by mouth every 2 hours until bowel movement this weekend he will not take more than 6 doses and one 24-hour period. If no bowel movement with this regimen he will try using a fleets enema. If no bowel movement after Fleet's enema or if he develops worsening abdominal pain or fever he will call back and we will consider admission.  2. Diabetes - Pt with Type 2 DM. He reports that recent control has been good and sugars mostly near 130. He denies any low sugars. He is working on improving his diet. He is recently been participating in cardiac rehabilitation and has been exercising on a regular basis. Encouraged him to continue with this. Will check HgbA1c with labs today.  He has not been on an ACE inhibitor secondary to renal deficiency. He is on a statin. Need to obtain results of last Opto exam.  3. Psoriasis -Patient with severe psoriasis. He was previously treated with PUVA therapy and topical steroids. His psoriasis markedly improved with topical steroids however the cost of these medications has precluded continued treatment.  We will set him back up with Dr. Nehemiah Massed in dermatology to see if any more affordable treatments available, or if he might be a candidate for PUVA again.  4. Hypertension -  Patient with hypertension. Blood pressure is elevated today.In the past he was taken off ACE inhibitors because of hypotension and renal insufficiency. He is currently taking beta blockers for blood pressure control. Suspect his blood pressure is elevated today in the setting of abdominal discomfort. We'll monitor his blood pressure closely and have him return to clinic in one month for recheck. Will check renal function with labs today.

## 2011-03-16 ENCOUNTER — Encounter (HOSPITAL_COMMUNITY): Payer: Medicare Other

## 2011-03-19 ENCOUNTER — Encounter (HOSPITAL_COMMUNITY): Payer: Medicare Other

## 2011-03-21 ENCOUNTER — Encounter (HOSPITAL_COMMUNITY): Payer: Medicare Other

## 2011-03-23 ENCOUNTER — Encounter (HOSPITAL_COMMUNITY): Payer: Medicare Other

## 2011-03-24 ENCOUNTER — Other Ambulatory Visit: Payer: Self-pay | Admitting: Internal Medicine

## 2011-03-26 ENCOUNTER — Encounter (HOSPITAL_COMMUNITY): Payer: Medicare Other

## 2011-03-28 ENCOUNTER — Encounter (HOSPITAL_COMMUNITY): Payer: Medicare Other

## 2011-03-30 ENCOUNTER — Encounter (HOSPITAL_COMMUNITY): Payer: Medicare Other

## 2011-04-02 ENCOUNTER — Encounter (HOSPITAL_COMMUNITY): Payer: Medicare Other | Attending: Cardiovascular Disease

## 2011-04-02 ENCOUNTER — Other Ambulatory Visit: Payer: Medicare Other

## 2011-04-02 ENCOUNTER — Ambulatory Visit: Payer: Medicare Other | Admitting: Vascular Surgery

## 2011-04-02 DIAGNOSIS — K219 Gastro-esophageal reflux disease without esophagitis: Secondary | ICD-10-CM | POA: Insufficient documentation

## 2011-04-02 DIAGNOSIS — I080 Rheumatic disorders of both mitral and aortic valves: Secondary | ICD-10-CM | POA: Insufficient documentation

## 2011-04-02 DIAGNOSIS — Z794 Long term (current) use of insulin: Secondary | ICD-10-CM | POA: Insufficient documentation

## 2011-04-02 DIAGNOSIS — Z951 Presence of aortocoronary bypass graft: Secondary | ICD-10-CM | POA: Insufficient documentation

## 2011-04-02 DIAGNOSIS — G609 Hereditary and idiopathic neuropathy, unspecified: Secondary | ICD-10-CM | POA: Insufficient documentation

## 2011-04-02 DIAGNOSIS — E119 Type 2 diabetes mellitus without complications: Secondary | ICD-10-CM | POA: Insufficient documentation

## 2011-04-02 DIAGNOSIS — F3289 Other specified depressive episodes: Secondary | ICD-10-CM | POA: Insufficient documentation

## 2011-04-02 DIAGNOSIS — G4733 Obstructive sleep apnea (adult) (pediatric): Secondary | ICD-10-CM | POA: Insufficient documentation

## 2011-04-02 DIAGNOSIS — Z5189 Encounter for other specified aftercare: Secondary | ICD-10-CM | POA: Insufficient documentation

## 2011-04-02 DIAGNOSIS — Z9861 Coronary angioplasty status: Secondary | ICD-10-CM | POA: Insufficient documentation

## 2011-04-02 DIAGNOSIS — I6529 Occlusion and stenosis of unspecified carotid artery: Secondary | ICD-10-CM | POA: Insufficient documentation

## 2011-04-02 DIAGNOSIS — F329 Major depressive disorder, single episode, unspecified: Secondary | ICD-10-CM | POA: Insufficient documentation

## 2011-04-02 DIAGNOSIS — I4891 Unspecified atrial fibrillation: Secondary | ICD-10-CM | POA: Insufficient documentation

## 2011-04-02 DIAGNOSIS — Z8673 Personal history of transient ischemic attack (TIA), and cerebral infarction without residual deficits: Secondary | ICD-10-CM | POA: Insufficient documentation

## 2011-04-02 DIAGNOSIS — I10 Essential (primary) hypertension: Secondary | ICD-10-CM | POA: Insufficient documentation

## 2011-04-02 DIAGNOSIS — Z7982 Long term (current) use of aspirin: Secondary | ICD-10-CM | POA: Insufficient documentation

## 2011-04-02 DIAGNOSIS — E669 Obesity, unspecified: Secondary | ICD-10-CM | POA: Insufficient documentation

## 2011-04-02 DIAGNOSIS — I2510 Atherosclerotic heart disease of native coronary artery without angina pectoris: Secondary | ICD-10-CM | POA: Insufficient documentation

## 2011-04-02 DIAGNOSIS — Z880 Allergy status to penicillin: Secondary | ICD-10-CM | POA: Insufficient documentation

## 2011-04-04 ENCOUNTER — Encounter (HOSPITAL_COMMUNITY): Payer: Medicare Other

## 2011-04-06 ENCOUNTER — Encounter (HOSPITAL_COMMUNITY): Payer: Medicare Other

## 2011-04-09 ENCOUNTER — Encounter (HOSPITAL_COMMUNITY): Payer: Medicare Other

## 2011-04-11 ENCOUNTER — Encounter (HOSPITAL_COMMUNITY): Payer: Medicare Other

## 2011-04-12 ENCOUNTER — Encounter: Payer: Self-pay | Admitting: Internal Medicine

## 2011-04-12 ENCOUNTER — Ambulatory Visit (INDEPENDENT_AMBULATORY_CARE_PROVIDER_SITE_OTHER): Payer: Medicare Other | Admitting: Internal Medicine

## 2011-04-12 VITALS — BP 133/85 | HR 64 | Temp 97.5°F | Resp 20 | Wt 294.0 lb

## 2011-04-12 DIAGNOSIS — E119 Type 2 diabetes mellitus without complications: Secondary | ICD-10-CM

## 2011-04-12 DIAGNOSIS — F411 Generalized anxiety disorder: Secondary | ICD-10-CM

## 2011-04-12 DIAGNOSIS — I10 Essential (primary) hypertension: Secondary | ICD-10-CM

## 2011-04-12 DIAGNOSIS — M79605 Pain in left leg: Secondary | ICD-10-CM

## 2011-04-12 DIAGNOSIS — Z23 Encounter for immunization: Secondary | ICD-10-CM

## 2011-04-12 DIAGNOSIS — F419 Anxiety disorder, unspecified: Secondary | ICD-10-CM

## 2011-04-12 DIAGNOSIS — Z125 Encounter for screening for malignant neoplasm of prostate: Secondary | ICD-10-CM

## 2011-04-12 DIAGNOSIS — M79609 Pain in unspecified limb: Secondary | ICD-10-CM

## 2011-04-12 MED ORDER — METOPROLOL TARTRATE 25 MG PO TABS: 50.0000 mg | ORAL_TABLET | Freq: Two times a day (BID) | ORAL | Status: DC

## 2011-04-12 MED ORDER — DIAZEPAM 2 MG PO TABS: 2.0000 mg | ORAL_TABLET | Freq: Three times a day (TID) | ORAL | Status: DC | PRN

## 2011-04-12 MED ORDER — HYDROCODONE-ACETAMINOPHEN 5-500 MG PO TABS: 2.0000 | ORAL_TABLET | Freq: Four times a day (QID) | ORAL | Status: DC | PRN

## 2011-04-12 MED ORDER — METOPROLOL SUCCINATE ER 50 MG PO TB24: 50.0000 mg | ORAL_TABLET | Freq: Two times a day (BID) | ORAL | Status: DC

## 2011-04-12 NOTE — Progress Notes (Signed)
Subjective:    Patient ID: [name] [name], male    DOB: 01-03-1956, 55 y.o.   MRN: [id number]  HPI [name] [name] is a 55 year old male with a history of diabetes, hypertension, hyperlipidemia, atherosclerosis, and depression who presents for followup. His primary concern today is severe left lower leg pain. He is status post angioplasty in the past his left femoral artery. He reports he has contacted his vascular surgeon and is scheduled for evaluation on Monday. He reports that the pain in his left lower leg has gotten progressively worse over the last few weeks. He has been using Vicodin as needed for severe pain.  In regards to his diabetes, [name] [name] reports improving control of his blood sugars. His most recent hemoglobin A1c checked one month ago was 8.1%. This is an improvement compared to previous. He denies any low blood sugars. He reports full compliance with his Lantus insulin.  [name] [name] reports that he had significant improvement in his constipation and abdominal distention after taking lactulose. He continues to use MiraLAX on a daily basis to help prevent further constipation. He denies any abdominal pain, abdominal distention, nausea, vomiting, or other symptoms today.  [name] [name] has a long history of depression. His symptoms have been fairly well controlled on medication. He recently sought counseling with a local psychologist. He requests that we set him up with a new psychologist at our clinic.  Outpatient Encounter Prescriptions as of 04/12/2011  Medication Sig Dispense Refill  . metoprolol (TOPROL-XL) 50 MG 24 hr tablet Take 1 tablet (50 mg total) by mouth 2 (two) times daily.  60 tablet  6  . DISCONTD: metoprolol (TOPROL-XL) 50 MG 24 hr tablet Take 50 mg by mouth Twice daily.      [name] aspirin 325 MG tablet Take 325 mg by mouth daily.        [name] buPROPion (WELLBUTRIN XL) 150 MG 24 hr tablet Take 1 tablet by mouth daily.      . cholecalciferol (VITAMIN D) 1000 UNITS tablet Take 1,000  Units by mouth daily.        . clopidogrel (PLAVIX) 75 MG tablet Take 75 mg by mouth daily.        . cyclobenzaprine (FLEXERIL) 10 MG tablet Take 10 mg by mouth 3 (three) times daily as needed.        . diazepam (VALIUM) 2 MG tablet Take 1 tablet (2 mg total) by mouth every 8 (eight) hours as needed for anxiety.  90 tablet  2  . diazepam (VALIUM) 5 MG tablet Take 5 mg by mouth daily.        . DULoxetine (CYMBALTA) 60 MG capsule Take 60 mg by mouth daily.        [name] HYDROcodone-acetaminophen (VICODIN) 5-500 MG per tablet Take 2 tablets by mouth every 6 (six) hours as needed for pain.  20 tablet  0  . insulin glargine (LANTUS SOLOSTAR) 100 UNIT/ML injection Inject 15 Units into the skin at bedtime.        . Lactulose 20 GM/30ML SOLN Take 30 mLs (20 g total) by mouth every 2 (two) hours as needed (until BM).  500 mL  0  . Misc. Devices ([place]) [place] by Does not apply route. AS DIRECTED       . Multiple Vitamin (MULTIVITAMIN) capsule Take 1 capsule by mouth daily.        . nitroGLYCERIN (NITROSTAT) 0.4 MG SL tablet Place 1 tablet (0.4 mg total) under the tongue every 5 (  five) minutes as needed for chest pain.  25 tablet  6  . NOVOFINE 32G X 6 MM MISC as directed.      . simvastatin (ZOCOR) 20 MG tablet Take 1 tablet by mouth daily.      . traMADol (ULTRAM) 50 MG tablet Take 50 mg by mouth every 6 (six) hours as needed. TAKE 1-2         Review of Systems  Constitutional: Negative for fever, chills, activity change, appetite change, fatigue and unexpected weight change.  Eyes: Negative for visual disturbance.  Respiratory: Negative for cough and shortness of breath.   Cardiovascular: Negative for chest pain, palpitations and leg swelling.  Gastrointestinal: Negative for abdominal pain and abdominal distention.  Genitourinary: Negative for dysuria, urgency and difficulty urinating.  Musculoskeletal: Positive for myalgias (severe left leg), arthralgias and gait problem.  Skin: Positive  for rash. Negative for color change.  Neurological: Positive for weakness and numbness.  Hematological: Negative for adenopathy.  Psychiatric/Behavioral: Positive for dysphoric mood. Negative for sleep disturbance. The patient is not nervous/anxious.    BP 133/85  Pulse 64  Temp 97.5 F (36.4 C)  Resp 20  Wt 294 lb (133.358 kg)  SpO2 97%     Objective:   Physical Exam  Constitutional: He is oriented to person, place, and time. He appears well-developed and well-nourished. No distress.  HENT:  Head: Normocephalic and atraumatic.  Right Ear: External ear normal.  Left Ear: External ear normal.  Nose: Nose normal.  Mouth/Throat: Oropharynx is clear and moist. No oropharyngeal exudate.  Eyes: Conjunctivae and EOM are normal. Pupils are equal, round, and reactive to light. Right eye exhibits no discharge. Left eye exhibits no discharge. No scleral icterus.  Neck: Normal range of motion. Neck supple. No tracheal deviation present. No thyromegaly present.  Cardiovascular: Normal rate, regular rhythm and normal heart sounds.  Exam reveals no gallop and no friction rub.   No murmur heard. Pulmonary/Chest: Effort normal and breath sounds normal. No respiratory distress. He has no wheezes. He has no rales. He exhibits no tenderness.  Musculoskeletal: Normal range of motion. He exhibits no edema.  Lymphadenopathy:    He has no cervical adenopathy.  Neurological: He is alert and oriented to person, place, and time. No cranial nerve deficit. Coordination normal.  Skin: Skin is warm and dry. Rash noted. He is not diaphoretic. No erythema. No pallor.          Purple discoloration with delayed capillary refill, left lower leg. Leg is warm to touch  Plaques consistent with psoriasis over elbows  Psychiatric: He has a normal mood and affect. His behavior is normal. Judgment and thought content normal.          Assessment & Plan:  1. Left leg pain - patient with left lower leg pain.  Consistent with known PVD.  Exam is remarkable for purple discoloration of his lower leg, however his lower leg is warm to touch. He has followup with his vascular surgeon on Monday. He will continue to use Vicodin as needed for severe pain. If his pain becomes significantly worse or if he develops coolness in his left lower extremity then he will call sooner for evaluation.  2. Diabetes -  patient with history of diabetes. Recent hemoglobin A1c was improved, but still below goal at 8.1%. He likely has room to increase his Lantus, however we will hold off on this for now until acute issue with left lower extremity is resolved. He has followup  in 6 weeks.  3. Hypertension - blood pressure is improved today. He will continue current medications. There was some confusion about his metoprolol and whether he was on long-acting versus short-acting metoprolol. Based on pharmacy records he has been taking Toprol-XL. We will plan to continue for now.  4. Depression -  patient with long history of depression. He felt uncomfortable with his previous psychologist. He requests referral to psychologist she works at [employer] at [employer]. We will set this up for him.  5. Psoriasis -  patient with psoriasis. He reports that he has a dermatology appointment set up to start laser treatment.  6. Constipation - patient with recent history of severe constipation. Symptoms are now resolved with use of MiraLAX and lactulose. We'll continue to follow.

## 2011-04-13 ENCOUNTER — Encounter (HOSPITAL_COMMUNITY): Payer: Medicare Other

## 2011-04-16 ENCOUNTER — Encounter (HOSPITAL_COMMUNITY): Payer: Medicare Other

## 2011-04-16 ENCOUNTER — Encounter: Payer: Self-pay | Admitting: Vascular Surgery

## 2011-04-17 ENCOUNTER — Encounter: Payer: Self-pay | Admitting: Vascular Surgery

## 2011-04-17 ENCOUNTER — Ambulatory Visit (INDEPENDENT_AMBULATORY_CARE_PROVIDER_SITE_OTHER): Payer: Medicare Other | Admitting: Vascular Surgery

## 2011-04-17 ENCOUNTER — Other Ambulatory Visit (INDEPENDENT_AMBULATORY_CARE_PROVIDER_SITE_OTHER): Payer: Medicare Other | Admitting: Vascular Surgery

## 2011-04-17 VITALS — BP 141/76 | HR 91 | Resp 20 | Ht 66.0 in | Wt 265.0 lb

## 2011-04-17 DIAGNOSIS — I6529 Occlusion and stenosis of unspecified carotid artery: Secondary | ICD-10-CM

## 2011-04-17 DIAGNOSIS — Z48812 Encounter for surgical aftercare following surgery on the circulatory system: Secondary | ICD-10-CM | POA: Insufficient documentation

## 2011-04-17 DIAGNOSIS — Z4889 Encounter for other specified surgical aftercare: Secondary | ICD-10-CM | POA: Insufficient documentation

## 2011-04-17 DIAGNOSIS — I779 Disorder of arteries and arterioles, unspecified: Secondary | ICD-10-CM | POA: Insufficient documentation

## 2011-04-17 NOTE — Progress Notes (Signed)
Subjective:     Patient ID: [name] [name], male   DOB: 07/21/1956, 55 y.o.   MRN: [id number]  HPI this 55 year old male is seen in followup regarding left carotid endarterectomy performed by me at the time of coronary artery bypass grafting February of 2012. Patient had a history of left brain TIAs and severe left internal carotid stenosis. He has done well since that time with no recurrent neurologic symptoms or stroke. He also done well from a cardiac standpoint. He does not ambulate long distances. He denies any hemiparesthesias,  aphasia, diplopia, blurred vision, or syncope. He is taking aspirin daily and Plavix  Past Medical History  Diagnosis Date  . Peripheral vascular disease   . Diabetes mellitus   . Mild aortic stenosis   . Tobacco use disorder     recently quit  . Depression   . Psoriasis   . Neuropathy   . Obesity   . Post splenectomy syndrome   . SOB (shortness of breath)   . Carotid artery occlusion   . Coronary artery disease   . Hyperlipidemia   . Hypertension   . Bell's palsy 2007  . Sleep apnea   . Angina at rest     chronic    History  Substance Use Topics  . Smoking status: Current Some Day Smoker -- 0.3 packs/day for 40 years    Types: Cigarettes  . Smokeless tobacco: Never Used  . Alcohol Use: No    Family History  Problem Relation Age of Onset  . Cancer Father 49  . Cancer Sister 27    Allergies  Allergen Reactions  . Glipizide     ANTIDIABETICS  . Penicillins     Current outpatient prescriptions:aspirin 325 MG tablet, Take 325 mg by mouth daily.  , Disp: , Rfl: ;  buPROPion (WELLBUTRIN XL) 150 MG 24 hr tablet, Take 1 tablet by mouth daily., Disp: , Rfl: ;  cholecalciferol (VITAMIN D) 1000 UNITS tablet, Take 1,000 Units by mouth daily.  , Disp: , Rfl: ;  clopidogrel (PLAVIX) 75 MG tablet, Take 75 mg by mouth daily.  , Disp: , Rfl:  cyclobenzaprine (FLEXERIL) 10 MG tablet, Take 10 mg by mouth 3 (three) times daily as needed.  , Disp: , Rfl: ;   diazepam (VALIUM) 2 MG tablet, Take 1 tablet (2 mg total) by mouth every 8 (eight) hours as needed for anxiety., Disp: 90 tablet, Rfl: 2;  diazepam (VALIUM) 5 MG tablet, Take 5 mg by mouth daily.  , Disp: , Rfl: ;  DULoxetine (CYMBALTA) 60 MG capsule, Take 60 mg by mouth daily.  , Disp: , Rfl:  HYDROcodone-acetaminophen (VICODIN) 5-500 MG per tablet, Take 2 tablets by mouth every 6 (six) hours as needed for pain., Disp: 20 tablet, Rfl: 0;  insulin glargine (LANTUS SOLOSTAR) 100 UNIT/ML injection, Inject 15 Units into the skin 2 (two) times daily. , Disp: , Rfl: ;  Lactulose 20 GM/30ML SOLN, Take 30 mLs (20 g total) by mouth every 2 (two) hours as needed (until BM)., Disp: 500 mL, Rfl: 0 metoprolol (TOPROL-XL) 50 MG 24 hr tablet, Take 1 tablet (50 mg total) by mouth 2 (two) times daily., Disp: 60 tablet, Rfl: 6;  Misc. Devices ([place]) MISC, by Does not apply route. AS DIRECTED , Disp: , Rfl: ;  Multiple Vitamin (MULTIVITAMIN) capsule, Take 1 capsule by mouth daily.  , Disp: , Rfl:  nitroGLYCERIN (NITROSTAT) 0.4 MG SL tablet, Place 1 tablet (0.4 mg total) under the tongue  every 5 (five) minutes as needed for chest pain., Disp: 25 tablet, Rfl: 6;  simvastatin (ZOCOR) 20 MG tablet, Take 1 tablet by mouth daily., Disp: , Rfl: ;  NOVOFINE 32G X 6 MM MISC, as directed., Disp: , Rfl: ;  traMADol (ULTRAM) 50 MG tablet, Take 50 mg by mouth every 6 (six) hours as needed. TAKE 1-2 , Disp: , Rfl:   BP 141/76  Pulse 91  Resp 20  Ht 5\' 6"  (1.676 m)  Wt 265 lb (120.203 kg)  BMI 42.77 kg/m2  Body mass index is 42.77 kg/(m^2).         Review of Systems patient develops weakness in his legs with ambulation. He is followed by a vascular surgeon in [place] who has placed stents in both femoral arteries. Chest pain but does have unsteady gait all other systems negative     Objective:   Physical Exam blood pressure 141/76 heart rate 91 respirations 20 HEENT exam normal for age Chest no rhonchi  or wheezing Cardiovascular regular rhythm no murmurs carotid pulses 3+ no bruits Abdomen obese no palpable masses Lower extremity exam 3+ femoral pulses bilaterally with well-perfused lower extremities Neurologic exam normal Skin free of rashes Musculoskeletal free of major deformities  Today I ordered a carotid duplex exam which are reviewed and interpreted. There is no restenosis in the left endarterectomy site on the carotid canal the right there is a mild narrowing in the right internal carotid approximating 30-40%    Assessment:     Doing well post left carotid endarterectomy combined with corner artery bypass grafting February 20 12th-asymptomatic    Plan:    return in one year for followup carotid duplex exam and less developed symptomatology in the interim

## 2011-04-17 NOTE — Progress Notes (Signed)
Addended by: Denman George on: 04/17/2011 05:06 PM   Modules accepted: Orders

## 2011-04-18 ENCOUNTER — Encounter (HOSPITAL_COMMUNITY): Payer: Medicare Other

## 2011-04-20 ENCOUNTER — Encounter (HOSPITAL_COMMUNITY): Payer: Medicare Other

## 2011-04-20 ENCOUNTER — Other Ambulatory Visit: Payer: Self-pay | Admitting: Dermatopathology

## 2011-04-20 ENCOUNTER — Telehealth: Payer: Self-pay | Admitting: *Deleted

## 2011-04-20 LAB — GLUCOSE, CAPILLARY
Glucose-Capillary: 352 mg/dL — ABNORMAL HIGH (ref 70–99)
Glucose-Capillary: 388 mg/dL — ABNORMAL HIGH (ref 70–99)

## 2011-04-20 NOTE — Telephone Encounter (Signed)
Patient is feeling light headed and having some blurred vision. He's not complaining of any other symptoms. When he got to his appt with cardiac rehab his sugar was 352, they pushed fluids and checked it again and it was 388. His urine was neg for ketones. He is currently taking 15 units of insulin bid per patient. Does patient need an appt or should he increase his insulin. Please advise.

## 2011-04-20 NOTE — Telephone Encounter (Signed)
Patient notified. He says that he is not feeling light headed or dizzy any more, but if he does start having symptoms again he will go to the ER.

## 2011-04-20 NOTE — Telephone Encounter (Signed)
We can increase his insulin to 20units twice daily. If he has blurred vision or lightheadedness he should be evaluated in the ED, because he has known blockages in his carotid arteries and may be having stroke.

## 2011-04-23 ENCOUNTER — Encounter (HOSPITAL_COMMUNITY): Payer: Medicare Other

## 2011-04-23 NOTE — Procedures (Unsigned)
CAROTID DUPLEX EXAM  INDICATION:  Carotid stenosis.  HISTORY: Diabetes:  Yes. Cardiac:  CAD, PTCA stenting. Hypertension:  Yes. Smoking:  Currently. Previous Surgery:  Left carotid endarterectomy on 09/06/2010. CV History:  Asymptomatic. Amaurosis [place] No, Paresthesias No, Hemiparesis No.                                      RIGHT             LEFT Brachial systolic pressure:         146               152 Brachial Doppler waveforms:         WNL               WNL Vertebral direction of flow:        Antegrade         Antegrade DUPLEX VELOCITIES (cm/sec) CCA peak systolic                   69                65 ECA peak systolic                   105               [id number] ICA peak systolic                   123               88 ICA end diastolic                   30                16 PLAQUE MORPHOLOGY:                  Heterogenous      Not visualized PLAQUE AMOUNT:                      Mild              Not visualized PLAQUE LOCATION:                    CCA/ICA           Not visualized  IMPRESSION: 1. Right internal carotid artery stenosis in the 1% to 39% range.  May     be underestimated due to dense plaque and high bifurcation, making     Doppler interrogation difficult. 2. Left internal carotid artery is patent with a history of     endarterectomy.  No evidence of stenosis or hyperplasia is     identified. 3. Patent and antegrade bilateral vertebral arteries. 4. Patent bilateral external carotid arteries.      ___________________________________________ Nelda Severe Kellie Simmering, M.D.  SH/MEDQ  D:  04/17/2011  T:  04/17/2011  Job:  [id number]

## 2011-04-25 ENCOUNTER — Other Ambulatory Visit: Payer: Self-pay | Admitting: Dermatopathology

## 2011-04-25 ENCOUNTER — Encounter (HOSPITAL_COMMUNITY): Payer: Medicare Other | Attending: Dermatopathology

## 2011-04-25 DIAGNOSIS — I10 Essential (primary) hypertension: Secondary | ICD-10-CM | POA: Insufficient documentation

## 2011-04-25 DIAGNOSIS — I080 Rheumatic disorders of both mitral and aortic valves: Secondary | ICD-10-CM | POA: Insufficient documentation

## 2011-04-25 DIAGNOSIS — Z880 Allergy status to penicillin: Secondary | ICD-10-CM | POA: Insufficient documentation

## 2011-04-25 DIAGNOSIS — Z5189 Encounter for other specified aftercare: Secondary | ICD-10-CM | POA: Insufficient documentation

## 2011-04-25 DIAGNOSIS — F329 Major depressive disorder, single episode, unspecified: Secondary | ICD-10-CM | POA: Insufficient documentation

## 2011-04-25 DIAGNOSIS — Z951 Presence of aortocoronary bypass graft: Secondary | ICD-10-CM | POA: Insufficient documentation

## 2011-04-25 DIAGNOSIS — I2510 Atherosclerotic heart disease of native coronary artery without angina pectoris: Secondary | ICD-10-CM | POA: Insufficient documentation

## 2011-04-25 DIAGNOSIS — G4733 Obstructive sleep apnea (adult) (pediatric): Secondary | ICD-10-CM | POA: Insufficient documentation

## 2011-04-25 DIAGNOSIS — Z8673 Personal history of transient ischemic attack (TIA), and cerebral infarction without residual deficits: Secondary | ICD-10-CM | POA: Insufficient documentation

## 2011-04-25 DIAGNOSIS — K219 Gastro-esophageal reflux disease without esophagitis: Secondary | ICD-10-CM | POA: Insufficient documentation

## 2011-04-25 DIAGNOSIS — F3289 Other specified depressive episodes: Secondary | ICD-10-CM | POA: Insufficient documentation

## 2011-04-25 DIAGNOSIS — G609 Hereditary and idiopathic neuropathy, unspecified: Secondary | ICD-10-CM | POA: Insufficient documentation

## 2011-04-25 DIAGNOSIS — E119 Type 2 diabetes mellitus without complications: Secondary | ICD-10-CM | POA: Insufficient documentation

## 2011-04-25 DIAGNOSIS — Z9861 Coronary angioplasty status: Secondary | ICD-10-CM | POA: Insufficient documentation

## 2011-04-25 DIAGNOSIS — Z794 Long term (current) use of insulin: Secondary | ICD-10-CM | POA: Insufficient documentation

## 2011-04-25 DIAGNOSIS — Z7982 Long term (current) use of aspirin: Secondary | ICD-10-CM | POA: Insufficient documentation

## 2011-04-25 DIAGNOSIS — E669 Obesity, unspecified: Secondary | ICD-10-CM | POA: Insufficient documentation

## 2011-04-25 DIAGNOSIS — I6529 Occlusion and stenosis of unspecified carotid artery: Secondary | ICD-10-CM | POA: Insufficient documentation

## 2011-04-25 DIAGNOSIS — I4891 Unspecified atrial fibrillation: Secondary | ICD-10-CM | POA: Insufficient documentation

## 2011-04-25 LAB — GLUCOSE, CAPILLARY
Glucose-Capillary: 183 mg/dL — ABNORMAL HIGH (ref 70–99)
Glucose-Capillary: 184 mg/dL — ABNORMAL HIGH (ref 70–99)

## 2011-04-27 ENCOUNTER — Encounter (HOSPITAL_COMMUNITY): Payer: Medicare Other

## 2011-04-28 ENCOUNTER — Other Ambulatory Visit: Payer: Self-pay | Admitting: Internal Medicine

## 2011-04-30 ENCOUNTER — Encounter (HOSPITAL_COMMUNITY): Payer: Medicare Other

## 2011-05-02 ENCOUNTER — Encounter (HOSPITAL_COMMUNITY): Payer: Medicare Other

## 2011-05-04 ENCOUNTER — Encounter (HOSPITAL_COMMUNITY): Payer: Medicare Other

## 2011-05-07 ENCOUNTER — Encounter (HOSPITAL_COMMUNITY): Payer: Medicare Other

## 2011-05-09 ENCOUNTER — Encounter (HOSPITAL_COMMUNITY): Payer: Medicare Other

## 2011-05-11 ENCOUNTER — Encounter (HOSPITAL_COMMUNITY): Payer: Medicare Other

## 2011-05-15 ENCOUNTER — Telehealth: Payer: Self-pay | Admitting: Internal Medicine

## 2011-05-15 ENCOUNTER — Other Ambulatory Visit: Payer: Self-pay | Admitting: Internal Medicine

## 2011-05-15 NOTE — Telephone Encounter (Signed)
Patient called and stated his blood sugars is running in the 340 range and he is worries that is BS is out of control.  Informed him to per/walker to increase his insulin to 30 units BID and call us back in 2 days and let us know his BS readings.

## 2011-05-16 ENCOUNTER — Telehealth: Payer: Self-pay | Admitting: Internal Medicine

## 2011-05-16 NOTE — Telephone Encounter (Signed)
Patient called and stated his BS before dinner last night was 340 and this morning before breakfast was 298.  He is going to continue with the increase of insulin and call me back in the morning.

## 2011-05-17 ENCOUNTER — Other Ambulatory Visit: Payer: Self-pay | Admitting: Internal Medicine

## 2011-05-21 ENCOUNTER — Telehealth: Payer: Self-pay | Admitting: Internal Medicine

## 2011-05-21 NOTE — Telephone Encounter (Signed)
INcrease to 40units twice daily

## 2011-05-21 NOTE — Telephone Encounter (Signed)
Patient called and stated his blood sugar this weekend was between 198 to 280 please advise. He is taking 30 units twice a day.

## 2011-05-23 NOTE — Telephone Encounter (Signed)
Left message asking patient to return my call.

## 2011-05-24 NOTE — Telephone Encounter (Signed)
Patient notified. Appt scheduled.

## 2011-05-24 NOTE — Telephone Encounter (Signed)
Patient returned call. He says that his blood sugar is still running high, sometime in the 300's. He says that he has already increased to 40 units bid since last week and that has not been helping. He is asking what else he can try.

## 2011-05-24 NOTE — Telephone Encounter (Signed)
Mr. Boxberger needs to be seen.

## 2011-05-28 ENCOUNTER — Ambulatory Visit (INDEPENDENT_AMBULATORY_CARE_PROVIDER_SITE_OTHER): Payer: Medicare Other | Admitting: Internal Medicine

## 2011-05-28 ENCOUNTER — Encounter: Payer: Self-pay | Admitting: Internal Medicine

## 2011-05-28 DIAGNOSIS — F319 Bipolar disorder, unspecified: Secondary | ICD-10-CM | POA: Insufficient documentation

## 2011-05-28 DIAGNOSIS — E119 Type 2 diabetes mellitus without complications: Secondary | ICD-10-CM

## 2011-05-28 DIAGNOSIS — I10 Essential (primary) hypertension: Secondary | ICD-10-CM

## 2011-05-28 DIAGNOSIS — F32A Depression, unspecified: Secondary | ICD-10-CM

## 2011-05-28 DIAGNOSIS — F329 Major depressive disorder, single episode, unspecified: Secondary | ICD-10-CM

## 2011-05-28 DIAGNOSIS — F339 Major depressive disorder, recurrent, unspecified: Secondary | ICD-10-CM | POA: Insufficient documentation

## 2011-05-28 LAB — COMPREHENSIVE METABOLIC PANEL
ALT: 20 U/L (ref 0–53)
AST: 17 U/L (ref 0–37)
Albumin: 3.7 g/dL (ref 3.5–5.2)
Alkaline Phosphatase: 133 U/L — ABNORMAL HIGH (ref 39–117)
BUN: 18 mg/dL (ref 6–23)
CO2: 27 mEq/L (ref 19–32)
Calcium: 8.7 mg/dL (ref 8.4–10.5)
Chloride: 104 mEq/L (ref 96–112)
Creatinine, Ser: 1.3 mg/dL (ref 0.4–1.5)
GFR: 58.26 mL/min — ABNORMAL LOW (ref >60.00)
Glucose, Bld: 222 mg/dL — ABNORMAL HIGH (ref 70–99)
Potassium: 5 mEq/L (ref 3.5–5.1)
Sodium: 139 mEq/L (ref 135–145)
Total Bilirubin: 0.3 mg/dL (ref 0.3–1.2)
Total Protein: 7.7 g/dL (ref 6.0–8.3)

## 2011-05-28 LAB — HEMOGLOBIN A1C: Hgb A1c MFr Bld: 9.3 % — ABNORMAL HIGH (ref 4.6–6.5)

## 2011-05-28 MED ORDER — CLOPIDOGREL BISULFATE 75 MG PO TABS: 75.0000 mg | ORAL_TABLET | Freq: Every day | ORAL | Status: DC

## 2011-05-28 MED ORDER — DULOXETINE HCL 60 MG PO CPEP: 60.0000 mg | ORAL_CAPSULE | Freq: Every day | ORAL | Status: DC

## 2011-05-28 MED ORDER — BUPROPION HCL ER (XL) 150 MG PO TB24: 150.0000 mg | ORAL_TABLET | Freq: Every day | ORAL | Status: DC

## 2011-05-28 MED ORDER — GLUCOSE BLOOD VI STRP: 1.0000 | ORAL_STRIP | Freq: Three times a day (TID) | Status: DC | PRN

## 2011-05-28 MED ORDER — LIRAGLUTIDE 18 MG/3ML ~~LOC~~ SOLN: 1.2000 mg | Freq: Every day | SUBCUTANEOUS | Status: DC
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
## 2011-05-28 NOTE — Patient Instructions (Addendum)
Increase Lantus to 50units twice daily. Start back on Victoza. Record blood sugars. Follow up 2 weeks.

## 2011-05-30 ENCOUNTER — Ambulatory Visit (INDEPENDENT_AMBULATORY_CARE_PROVIDER_SITE_OTHER): Payer: Medicare Other | Admitting: Psychology

## 2011-05-30 DIAGNOSIS — F331 Major depressive disorder, recurrent, moderate: Secondary | ICD-10-CM

## 2011-06-01 ENCOUNTER — Telehealth: Payer: Self-pay | Admitting: *Deleted

## 2011-06-01 NOTE — Telephone Encounter (Signed)
FYI - Pt left VM, insulin is working great, blood sugar is much better, ranging 105 to 152.

## 2011-06-04 ENCOUNTER — Other Ambulatory Visit: Payer: Medicare Other

## 2011-06-06 ENCOUNTER — Ambulatory Visit (INDEPENDENT_AMBULATORY_CARE_PROVIDER_SITE_OTHER): Payer: Medicare Other | Admitting: Internal Medicine

## 2011-06-06 ENCOUNTER — Encounter: Payer: Self-pay | Admitting: Internal Medicine

## 2011-06-06 ENCOUNTER — Other Ambulatory Visit: Payer: Self-pay

## 2011-06-06 DIAGNOSIS — F32A Depression, unspecified: Secondary | ICD-10-CM

## 2011-06-06 DIAGNOSIS — F329 Major depressive disorder, single episode, unspecified: Secondary | ICD-10-CM

## 2011-06-06 DIAGNOSIS — E119 Type 2 diabetes mellitus without complications: Secondary | ICD-10-CM

## 2011-06-06 MED ORDER — CLOPIDOGREL BISULFATE 75 MG PO TABS: 75.0000 mg | ORAL_TABLET | Freq: Every day | ORAL | Status: DC

## 2011-06-06 MED ORDER — LIRAGLUTIDE 18 MG/3ML ~~LOC~~ SOLN: 1.2000 mg | Freq: Every day | SUBCUTANEOUS | Status: DC

## 2011-06-06 NOTE — Telephone Encounter (Signed)
..   Requested Prescriptions   Signed Prescriptions Disp Refills  . clopidogrel (PLAVIX) 75 MG tablet 90 tablet 4    Sig: Take 1 tablet (75 mg total) by mouth daily.    Authorizing Provider: [name] A    Ordering User: Laurence Compton   E-scribe to [facility]

## 2011-06-06 NOTE — Progress Notes (Signed)
Subjective:    Patient ID: [name] [name], male    DOB: 04-Sep-1955, 55 y.o.   MRN: [id number]  HPI 55 year old male with a history of diabetes and depression presents for followup. In regards to his diabetes, he brings record of his blood sugars which show most blood sugars have been between 80 and 120 fasting. This is a significant improvement. He reports full compliance with his Lantus 50 units twice daily. He also reports full compliance with his Victoza. He denies any blood sugars below 80. He denies any recent sugars above 200.   In regards to his depression, he reports significant improvement in his symptoms since starting counseling with a psychologist. He reports that they have been discussing PTSD. His wife has also noted a significant improvement in his symptoms. He reports full compliance with his medications including Wellbutrin and Valium.  Outpatient Encounter Prescriptions as of 06/06/2011  Medication Sig Dispense Refill  . aspirin 325 MG tablet Take 325 mg by mouth daily.        [name] buPROPion (WELLBUTRIN XL) 150 MG 24 hr tablet Take 1 tablet (150 mg total) by mouth daily.  90 tablet  1  . cholecalciferol (VITAMIN D) 1000 UNITS tablet Take 1,000 Units by mouth daily.        . cyclobenzaprine (FLEXERIL) 10 MG tablet take 1 tablet by mouth three times a day AS NEEDED  90 tablet  3  . diazepam (VALIUM) 2 MG tablet Take 1 tablet (2 mg total) by mouth every 8 (eight) hours as needed for anxiety.  90 tablet  2  . diazepam (VALIUM) 5 MG tablet take 1 tablet by mouth at bedtime  30 tablet  4  . DULoxetine (CYMBALTA) 60 MG capsule Take 1 capsule (60 mg total) by mouth daily.  90 capsule  1  . glucose blood (BAYER CONTOUR TEST) test strip 1 each by Other route 3 (three) times daily as needed. Dx: 250.00  100 each  12  . HYDROcodone-acetaminophen (VICODIN) 5-500 MG per tablet Take 2 tablets by mouth every 6 (six) hours as needed for pain.  20 tablet  0  . insulin glargine (LANTUS SOLOSTAR) 100  UNIT/ML injection Inject 50 Units into the skin 2 (two) times daily.       . Lactulose 20 GM/30ML SOLN Take 30 mLs (20 g total) by mouth every 2 (two) hours as needed (until BM).  500 mL  0  . Liraglutide (VICTOZA) 18 MG/3ML SOLN Inject 0.2 mLs (1.2 mg total) into the skin daily.  6 mL  3  . metoprolol (TOPROL-XL) 50 MG 24 hr tablet Take 1 tablet (50 mg total) by mouth 2 (two) times daily.  60 tablet  6  . metoprolol tartrate (LOPRESSOR) 25 MG tablet       . Multiple Vitamin (MULTIVITAMIN) capsule Take 1 capsule by mouth daily.        . nitroGLYCERIN (NITROSTAT) 0.4 MG SL tablet Place 1 tablet (0.4 mg total) under the tongue every 5 (five) minutes as needed for chest pain.  25 tablet  6  . NOVOFINE 32G X 6 MM MISC as directed.      . simvastatin (ZOCOR) 20 MG tablet Take 1 tablet by mouth daily.      . traMADol (ULTRAM) 50 MG tablet Take 50 mg by mouth every 6 (six) hours as needed. TAKE 1-2         Review of Systems  Constitutional: Negative for fever, chills, activity change, appetite change, fatigue and  unexpected weight change.  Eyes: Negative for visual disturbance.  Respiratory: Negative for cough and shortness of breath.   Cardiovascular: Negative for chest pain, palpitations and leg swelling.  Gastrointestinal: Negative for abdominal pain and abdominal distention.  Genitourinary: Negative for dysuria, urgency and difficulty urinating.  Musculoskeletal: Negative for arthralgias and gait problem.  Skin: Negative for color change and rash.  Hematological: Negative for adenopathy.  Psychiatric/Behavioral: Negative for sleep disturbance and dysphoric mood. The patient is not nervous/anxious.    BP 118/70  Pulse 84  Temp(Src) 98.1 F (36.7 C) (Oral)  Wt 269 lb (122.018 kg)  SpO2 97%     Objective:   Physical Exam  Constitutional: He is oriented to person, place, and time. He appears well-developed and well-nourished. No distress.  HENT:  Head: Normocephalic and atraumatic.    Right Ear: External ear normal.  Left Ear: External ear normal.  Nose: Nose normal.  Mouth/Throat: Oropharynx is clear and moist. No oropharyngeal exudate.  Eyes: Conjunctivae and EOM are normal. Pupils are equal, round, and reactive to light. Right eye exhibits no discharge. Left eye exhibits no discharge. No scleral icterus.  Neck: Normal range of motion. Neck supple. No tracheal deviation present. No thyromegaly present.  Cardiovascular: Normal rate, regular rhythm and normal heart sounds.  Exam reveals no gallop and no friction rub.   No murmur heard. Pulmonary/Chest: Effort normal and breath sounds normal. No respiratory distress. He has no wheezes. He has no rales. He exhibits no tenderness.  Musculoskeletal: Normal range of motion. He exhibits no edema.  Lymphadenopathy:    He has no cervical adenopathy.  Neurological: He is alert and oriented to person, place, and time. No cranial nerve deficit. Coordination normal.  Skin: Skin is warm and dry. No rash noted. He is not diaphoretic. No erythema. No pallor.  Psychiatric: He has a normal mood and affect. His behavior is normal. Judgment and thought content normal.          Assessment & Plan:  1. DM - BG improved. Will continue lantus and victoza. Pt will call if BG<80 or >250. Follow up 1 month.  2. Depression - Improved with counseling with Dr. Nelva Bush. Will continue current meds. Follow up 1 month.

## 2011-06-08 ENCOUNTER — Other Ambulatory Visit: Payer: Self-pay | Admitting: Internal Medicine

## 2011-06-08 MED ORDER — INSULIN GLARGINE 100 UNIT/ML ~~LOC~~ SOLN: 50.0000 [IU] | Freq: Two times a day (BID) | SUBCUTANEOUS | Status: DC

## 2011-06-14 ENCOUNTER — Other Ambulatory Visit: Payer: Self-pay | Admitting: Internal Medicine

## 2011-06-20 ENCOUNTER — Ambulatory Visit (INDEPENDENT_AMBULATORY_CARE_PROVIDER_SITE_OTHER): Payer: Medicare Other | Admitting: Psychology

## 2011-06-20 DIAGNOSIS — F331 Major depressive disorder, recurrent, moderate: Secondary | ICD-10-CM

## 2011-06-20 DIAGNOSIS — F4310 Post-traumatic stress disorder, unspecified: Secondary | ICD-10-CM

## 2011-06-25 ENCOUNTER — Encounter: Payer: Self-pay | Admitting: Cardiology

## 2011-06-26 ENCOUNTER — Ambulatory Visit: Payer: Medicare Other | Admitting: Cardiovascular Disease

## 2011-06-27 LAB — HM COLONOSCOPY

## 2011-07-02 ENCOUNTER — Other Ambulatory Visit: Payer: Self-pay | Admitting: Internal Medicine

## 2011-07-02 ENCOUNTER — Ambulatory Visit: Payer: Medicare Other | Admitting: Cardiovascular Disease

## 2011-07-04 ENCOUNTER — Other Ambulatory Visit: Payer: Self-pay | Admitting: Internal Medicine

## 2011-07-05 ENCOUNTER — Ambulatory Visit (INDEPENDENT_AMBULATORY_CARE_PROVIDER_SITE_OTHER): Payer: Medicare Other | Admitting: Psychology

## 2011-07-05 DIAGNOSIS — F4310 Post-traumatic stress disorder, unspecified: Secondary | ICD-10-CM

## 2011-07-11 ENCOUNTER — Ambulatory Visit (INDEPENDENT_AMBULATORY_CARE_PROVIDER_SITE_OTHER): Payer: Medicare Other | Admitting: Psychology

## 2011-07-11 DIAGNOSIS — F4310 Post-traumatic stress disorder, unspecified: Secondary | ICD-10-CM

## 2011-07-11 DIAGNOSIS — F331 Major depressive disorder, recurrent, moderate: Secondary | ICD-10-CM

## 2011-07-12 ENCOUNTER — Ambulatory Visit (INDEPENDENT_AMBULATORY_CARE_PROVIDER_SITE_OTHER): Payer: Medicare Other | Admitting: Internal Medicine

## 2011-07-12 ENCOUNTER — Encounter: Payer: Self-pay | Admitting: Internal Medicine

## 2011-07-12 DIAGNOSIS — E119 Type 2 diabetes mellitus without complications: Secondary | ICD-10-CM

## 2011-07-12 DIAGNOSIS — F329 Major depressive disorder, single episode, unspecified: Secondary | ICD-10-CM

## 2011-07-12 DIAGNOSIS — F32A Depression, unspecified: Secondary | ICD-10-CM

## 2011-07-12 MED ORDER — LIRAGLUTIDE 18 MG/3ML ~~LOC~~ SOLN: 1.2000 mg | Freq: Every day | SUBCUTANEOUS | Status: DC

## 2011-07-12 NOTE — Progress Notes (Signed)
Subjective:    Patient ID: [name] [name], male    DOB: 12-Aug-1955, 55 y.o.   MRN: [id number]  HPI 55YO male with DM, CAD, HTN, HL, and anxiety/PTSD presents for follow up. He reports that he has recently established care with a new psychologist. He reports that he feels his interaction with her is very helpful. He notes that she is given his activities to complete at home. He reports a significant improvement in his anxiety and depression. He plans to meet with her on a regular basis. He reports full compliance with his medications.  In regards to his diabetes, he reports that recent sugars have been near 160. He also reports full compliance with his diabetes medicines. He denies any low blood sugars or sugars greater than 250.  Outpatient Encounter Prescriptions as of 07/12/2011  Medication Sig Dispense Refill  . aspirin 325 MG tablet Take 325 mg by mouth daily.        [name] buPROPion (WELLBUTRIN XL) 150 MG 24 hr tablet take 1 tablet by mouth once daily  30 tablet  6  . cholecalciferol (VITAMIN D) 1000 UNITS tablet Take 1,000 Units by mouth daily.        . clopidogrel (PLAVIX) 75 MG tablet Take 1 tablet (75 mg total) by mouth daily.  90 tablet  4  . cyclobenzaprine (FLEXERIL) 10 MG tablet take 1 tablet by mouth three times a day AS NEEDED  90 tablet  3  . diazepam (VALIUM) 2 MG tablet take 1 tablet by mouth every 8 hours if needed for anxiety  90 tablet  2  . diazepam (VALIUM) 5 MG tablet take 1 tablet by mouth at bedtime  30 tablet  4  . DULoxetine (CYMBALTA) 60 MG capsule Take 1 capsule (60 mg total) by mouth daily.  90 capsule  1  . glucose blood (BAYER CONTOUR TEST) test strip 1 each by Other route 3 (three) times daily as needed. Dx: 250.00  100 each  12  . HYDROcodone-acetaminophen (VICODIN) 5-500 MG per tablet Take 2 tablets by mouth every 6 (six) hours as needed for pain.  20 tablet  0  . insulin glargine (LANTUS SOLOSTAR) 100 UNIT/ML injection Inject 50 Units into the skin 2 (two) times  daily.  10 mL  6  . Lactulose 20 GM/30ML SOLN Take 30 mLs (20 g total) by mouth every 2 (two) hours as needed (until BM).  500 mL  0  . metoprolol (TOPROL-XL) 50 MG 24 hr tablet Take 1 tablet (50 mg total) by mouth 2 (two) times daily.  60 tablet  6  . metoprolol tartrate (LOPRESSOR) 25 MG tablet       . Multiple Vitamin (MULTIVITAMIN) capsule Take 1 capsule by mouth daily.        . nitroGLYCERIN (NITROSTAT) 0.4 MG SL tablet Place 1 tablet (0.4 mg total) under the tongue every 5 (five) minutes as needed for chest pain.  25 tablet  6  . NOVOFINE 32G X 6 MM MISC as directed.      . simvastatin (ZOCOR) 20 MG tablet take 1 tablet by mouth once daily  30 tablet  3  . traMADol (ULTRAM) 50 MG tablet Take 50 mg by mouth every 6 (six) hours as needed. TAKE 1-2       . Liraglutide (VICTOZA) 18 MG/3ML SOLN Inject 0.2 mLs (1.2 mg total) into the skin daily.  6 mL  3    Review of Systems  Constitutional: Negative for fever, chills, activity change,  appetite change, fatigue and unexpected weight change.  Eyes: Negative for visual disturbance.  Respiratory: Negative for cough and shortness of breath.   Cardiovascular: Negative for chest pain, palpitations and leg swelling.  Gastrointestinal: Negative for abdominal pain and abdominal distention.  Genitourinary: Negative for dysuria, urgency and difficulty urinating.  Musculoskeletal: Positive for back pain. Negative for arthralgias and gait problem.  Skin: Negative for color change and rash.  Hematological: Negative for adenopathy.  Psychiatric/Behavioral: Positive for dysphoric mood. Negative for sleep disturbance. The patient is nervous/anxious.    BP 138/70  Pulse 66  Temp(Src) 97.7 F (36.5 C) (Oral)  Wt 279 lb (126.554 kg)  SpO2 96%     Objective:   Physical Exam  Constitutional: He is oriented to person, place, and time. He appears well-developed and well-nourished. No distress.  HENT:  Head: Normocephalic and atraumatic.  Right Ear:  External ear normal.  Left Ear: External ear normal.  Nose: Nose normal.  Mouth/Throat: Oropharynx is clear and moist. No oropharyngeal exudate.  Eyes: Conjunctivae and EOM are normal. Pupils are equal, round, and reactive to light. Right eye exhibits no discharge. Left eye exhibits no discharge. No scleral icterus.  Neck: Normal range of motion. Neck supple. No tracheal deviation present. No thyromegaly present.  Cardiovascular: Normal rate, regular rhythm and normal heart sounds.  Exam reveals no gallop and no friction rub.   No murmur heard. Pulmonary/Chest: Effort normal and breath sounds normal. No respiratory distress. He has no wheezes. He has no rales. He exhibits no tenderness.  Musculoskeletal: Normal range of motion. He exhibits no edema.  Lymphadenopathy:    He has no cervical adenopathy.  Neurological: He is alert and oriented to person, place, and time. No cranial nerve deficit. Coordination normal.  Skin: Skin is warm and dry. No rash noted. He is not diaphoretic. No erythema. No pallor.  Psychiatric: He has a normal mood and affect. His behavior is normal. Judgment and thought content normal.          Assessment & Plan:  1. Anxiety/PTSD - Pt recently started working with Dr. Rexene Edison. He seems to be doing very well.  He will continue to work with her and continue current meds for now. Follow up 1 month.  2. Diabetes mellitus - Pt reports recent improved control. Will check A1c with labs in 07/2011. Victoza refilled today. Follow up 07/2011.

## 2011-07-31 ENCOUNTER — Ambulatory Visit: Payer: Medicare Other | Admitting: Cardiovascular Disease

## 2011-08-01 ENCOUNTER — Ambulatory Visit (INDEPENDENT_AMBULATORY_CARE_PROVIDER_SITE_OTHER): Payer: Medicare Other | Admitting: Psychology

## 2011-08-01 ENCOUNTER — Telehealth: Payer: Self-pay | Admitting: Internal Medicine

## 2011-08-01 DIAGNOSIS — F4310 Post-traumatic stress disorder, unspecified: Secondary | ICD-10-CM

## 2011-08-01 DIAGNOSIS — L408 Other psoriasis: Secondary | ICD-10-CM | POA: Diagnosis not present

## 2011-08-01 DIAGNOSIS — F331 Major depressive disorder, recurrent, moderate: Secondary | ICD-10-CM | POA: Diagnosis not present

## 2011-08-02 NOTE — Telephone Encounter (Signed)
Attempted to call, no answer.

## 2011-08-03 DIAGNOSIS — M79609 Pain in unspecified limb: Secondary | ICD-10-CM | POA: Diagnosis not present

## 2011-08-03 DIAGNOSIS — E1149 Type 2 diabetes mellitus with other diabetic neurological complication: Secondary | ICD-10-CM | POA: Diagnosis not present

## 2011-08-03 DIAGNOSIS — B351 Tinea unguium: Secondary | ICD-10-CM | POA: Diagnosis not present

## 2011-08-03 DIAGNOSIS — L03039 Cellulitis of unspecified toe: Secondary | ICD-10-CM | POA: Diagnosis not present

## 2011-08-03 DIAGNOSIS — L408 Other psoriasis: Secondary | ICD-10-CM | POA: Diagnosis not present

## 2011-08-04 LAB — HM DIABETES EYE EXAM: HM Diabetic Eye Exam: NORMAL

## 2011-08-06 ENCOUNTER — Other Ambulatory Visit (INDEPENDENT_AMBULATORY_CARE_PROVIDER_SITE_OTHER): Payer: Medicare Other | Admitting: *Deleted

## 2011-08-06 ENCOUNTER — Ambulatory Visit (INDEPENDENT_AMBULATORY_CARE_PROVIDER_SITE_OTHER): Payer: Medicare Other | Admitting: Cardiovascular Disease

## 2011-08-06 ENCOUNTER — Encounter: Payer: Self-pay | Admitting: Cardiovascular Disease

## 2011-08-06 VITALS — BP 135/76 | HR 69 | Ht 67.0 in | Wt 281.0 lb

## 2011-08-06 DIAGNOSIS — E119 Type 2 diabetes mellitus without complications: Secondary | ICD-10-CM

## 2011-08-06 DIAGNOSIS — E785 Hyperlipidemia, unspecified: Secondary | ICD-10-CM

## 2011-08-06 DIAGNOSIS — I2510 Atherosclerotic heart disease of native coronary artery without angina pectoris: Secondary | ICD-10-CM

## 2011-08-06 DIAGNOSIS — I10 Essential (primary) hypertension: Secondary | ICD-10-CM | POA: Diagnosis not present

## 2011-08-06 LAB — HEMOGLOBIN A1C: Hgb A1c MFr Bld: 9.3 % — ABNORMAL HIGH (ref 4.6–6.5)

## 2011-08-06 LAB — COMPREHENSIVE METABOLIC PANEL
ALT: 16 U/L (ref 0–53)
AST: 14 U/L (ref 0–37)
Albumin: 3.7 g/dL (ref 3.5–5.2)
Alkaline Phosphatase: 123 U/L — ABNORMAL HIGH (ref 39–117)
BUN: 22 mg/dL (ref 6–23)
CO2: 24 mEq/L (ref 19–32)
Calcium: 8.5 mg/dL (ref 8.4–10.5)
Chloride: 99 mEq/L (ref 96–112)
Creatinine, Ser: 1.3 mg/dL (ref 0.4–1.5)
GFR: 60.83 mL/min (ref >60.00)
Glucose, Bld: 320 mg/dL — ABNORMAL HIGH (ref 70–99)
Potassium: 5.4 mEq/L — ABNORMAL HIGH (ref 3.5–5.1)
Sodium: 139 mEq/L (ref 135–145)
Total Bilirubin: 0.4 mg/dL (ref 0.3–1.2)
Total Protein: 7.4 g/dL (ref 6.0–8.3)

## 2011-08-06 NOTE — Patient Instructions (Signed)
Your physician wants you to follow-up in: 6 months. You will receive a reminder letter in the mail one-two months in advance. If you don't receive a letter, please call our office to schedule the follow-up appointment. Your physician recommends that you continue on your current medications as directed. Please refer to the Current Medication list given to you today. 

## 2011-08-06 NOTE — Telephone Encounter (Signed)
Attempted call pt physician, no answer. Can you please call patient and ask what this is in regards to?

## 2011-08-07 ENCOUNTER — Other Ambulatory Visit: Payer: Self-pay | Admitting: *Deleted

## 2011-08-07 ENCOUNTER — Other Ambulatory Visit (INDEPENDENT_AMBULATORY_CARE_PROVIDER_SITE_OTHER): Payer: Medicare Other | Admitting: *Deleted

## 2011-08-07 ENCOUNTER — Ambulatory Visit (INDEPENDENT_AMBULATORY_CARE_PROVIDER_SITE_OTHER): Payer: Medicare Other | Admitting: Psychology

## 2011-08-07 DIAGNOSIS — F331 Major depressive disorder, recurrent, moderate: Secondary | ICD-10-CM | POA: Diagnosis not present

## 2011-08-07 DIAGNOSIS — F4310 Post-traumatic stress disorder, unspecified: Secondary | ICD-10-CM | POA: Diagnosis not present

## 2011-08-07 DIAGNOSIS — E875 Hyperkalemia: Secondary | ICD-10-CM | POA: Diagnosis not present

## 2011-08-07 DIAGNOSIS — L408 Other psoriasis: Secondary | ICD-10-CM | POA: Diagnosis not present

## 2011-08-07 LAB — BASIC METABOLIC PANEL
BUN: 20 mg/dL (ref 6–23)
CO2: 29 mEq/L (ref 19–32)
Calcium: 9.1 mg/dL (ref 8.4–10.5)
Chloride: 99 mEq/L (ref 96–112)
Creatinine, Ser: 1.3 mg/dL (ref 0.4–1.5)
GFR: 61.94 mL/min (ref >60.00)
Glucose, Bld: 411 mg/dL — ABNORMAL HIGH (ref 70–99)
Potassium: 4.7 mEq/L (ref 3.5–5.1)
Sodium: 135 mEq/L (ref 135–145)

## 2011-08-07 MED ORDER — SODIUM POLYSTYRENE SULFONATE 15 GM/60ML PO SUSP: 20.0000 g | Freq: Two times a day (BID) | ORAL | Status: AC

## 2011-08-07 NOTE — Telephone Encounter (Signed)
We can discuss this at his next visit.
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
## 2011-08-08 ENCOUNTER — Other Ambulatory Visit: Payer: Medicare Other

## 2011-08-10 ENCOUNTER — Encounter: Payer: Self-pay | Admitting: Cardiovascular Disease

## 2011-08-10 NOTE — Progress Notes (Signed)
HPI  This is a 56 year old male who is here today for a followup visit. He has known history of coronary artery disease status post coronary artery bypass graft surgery. He has been doing reasonably well. However, he seems to be having significant depression and anxiety. He also has been having hard time losing weight. He sees a Engineer, water. He gets occasional brief chest pain but nothing has changed since his last visit. He has been having hard time controlling his diabetes. He also complains of lower extremity claudication. He follows with Dr. Delana Meyer for management of PAD.  Allergies  Allergen Reactions  . Contrast Media (Iodinated Diagnostic Agents)     Got very hot and red  . Glipizide     ANTIDIABETICS  . Penicillins      Current Outpatient Prescriptions on File Prior to Visit  Medication Sig Dispense Refill  . aspirin 325 MG tablet Take 325 mg by mouth daily.        [name] buPROPion (WELLBUTRIN XL) 150 MG 24 hr tablet take 1 tablet by mouth once daily  30 tablet  6  . cholecalciferol (VITAMIN D) 1000 UNITS tablet Take 1,000 Units by mouth daily.        . clopidogrel (PLAVIX) 75 MG tablet Take 1 tablet (75 mg total) by mouth daily.  90 tablet  4  . cyclobenzaprine (FLEXERIL) 10 MG tablet take 1 tablet by mouth three times a day AS NEEDED  90 tablet  3  . diazepam (VALIUM) 2 MG tablet take 1 tablet by mouth every 8 hours if needed for anxiety  90 tablet  2  . diazepam (VALIUM) 5 MG tablet take 1 tablet by mouth at bedtime  30 tablet  4  . DULoxetine (CYMBALTA) 60 MG capsule Take 1 capsule (60 mg total) by mouth daily.  90 capsule  1  . glucose blood (BAYER CONTOUR TEST) test strip 1 each by Other route 3 (three) times daily as needed. Dx: 250.00  100 each  12  . HYDROcodone-acetaminophen (VICODIN) 5-500 MG per tablet Take 2 tablets by mouth every 6 (six) hours as needed for pain.  20 tablet  0  . insulin glargine (LANTUS SOLOSTAR) 100 UNIT/ML injection Inject 50 Units into the skin 2 (two)  times daily.  10 mL  6  . Lactulose 20 GM/30ML SOLN Take 30 mLs (20 g total) by mouth every 2 (two) hours as needed (until BM).  500 mL  0  . Liraglutide (VICTOZA) 18 MG/3ML SOLN Inject 0.2 mLs (1.2 mg total) into the skin daily.  6 mL  3  . metoprolol (TOPROL-XL) 50 MG 24 hr tablet Take 1 tablet (50 mg total) by mouth 2 (two) times daily.  60 tablet  6  . Multiple Vitamin (MULTIVITAMIN) capsule Take 1 capsule by mouth daily.        . nitroGLYCERIN (NITROSTAT) 0.4 MG SL tablet Place 1 tablet (0.4 mg total) under the tongue every 5 (five) minutes as needed for chest pain.  25 tablet  6  . NOVOFINE 32G X 6 MM MISC as directed.      . simvastatin (ZOCOR) 20 MG tablet take 1 tablet by mouth once daily  30 tablet  3  . traMADol (ULTRAM) 50 MG tablet Take 50 mg by mouth every 6 (six) hours as needed. TAKE 1-2          Past Medical History  Diagnosis Date  . Peripheral vascular disease   . Diabetes mellitus   . Mild  aortic stenosis   . Tobacco use disorder     recently quit  . Depression   . Psoriasis   . Neuropathy   . Obesity   . Post splenectomy syndrome   . SOB (shortness of breath)   . Carotid artery occlusion   . Coronary artery disease   . Hyperlipidemia   . Hypertension   . Bell's palsy 2007  . Sleep apnea   . Angina at rest     chronic  . Abnormal colonoscopy 06/2011    Dr. Minna Merritts, polyp     Past Surgical History  Procedure Date  . Splenectomy   . Heart stents Jan 2011    leg stents 06/2009 and 03/2010  . Coronary artery bypass graft 09/06/2010    At [facility]: LIMA to LAD, left radial to RCA, sequential SVG to OM3 and 4  . Cardiac catheterization 08/2010    LAD: 80% ISR, RCA: 80% ostial, OM 80-90%  . Coronary angioplasty     LAD: before CABG  . Carotid endarterectomy 08/2010    left/ Dr. Kellie Simmering  . Pta of illiac and sfa multiple    Dr. Ronalee Belts     Family History  Problem Relation Age of Onset  . Cancer Father 58  . Cancer Sister 17     History   Social  History  . Marital Status: Married    Spouse Name: N/A    Number of Children: N/A  . Years of Education: N/A   Occupational History  . Not on file.   Social History Main Topics  . Smoking status: Current Some Day Smoker -- 0.3 packs/day for 40 years    Types: Cigarettes  . Smokeless tobacco: Never Used  . Alcohol Use: No  . Drug Use: No  . Sexually Active: Not on file   Other Topics Concern  . Not on file   Social History Narrative  . No narrative on file     PHYSICAL EXAM   BP 135/76  Pulse 69  Ht 5\' 7"  (1.702 m)  Wt 281 lb (127.461 kg)  BMI 44.01 kg/m2  Constitutional: He is oriented to person, place, and time. He appears well-developed and well-nourished. No distress.  HENT: No nasal discharge.  Head: Normocephalic and atraumatic.  Eyes: Pupils are equal, round, and reactive to light. Right eye exhibits no discharge. Left eye exhibits no discharge.  Neck: Normal range of motion. Neck supple. No JVD present. No thyromegaly present.  Cardiovascular: Normal rate, regular rhythm, normal heart sounds. Exam reveals no gallop and no friction rub.  There is a 2/6 systolic ejection murmur at the aortic area. Pulmonary/Chest: Effort normal and breath sounds normal. No stridor. No respiratory distress. He has no wheezes. He has no rales. He exhibits no tenderness.  Abdominal: Soft. Bowel sounds are normal. He exhibits no distension. There is no tenderness. There is no rebound and no guarding.  Musculoskeletal: Normal range of motion. He exhibits no edema and no tenderness.  Neurological: He is alert and oriented to person, place, and time. Coordination normal.  Skin: Skin is warm and dry. No rash noted. He is not diaphoretic. No erythema. No pallor.  Psychiatric: He has a normal mood and affect. His behavior is normal. Judgment and thought content normal.        ASSESSMENT AND PLAN

## 2011-08-10 NOTE — Assessment & Plan Note (Signed)
Most recent lipid profile showed elevated triglyceride and low HDL. I suspect that most of that is related to uncontrolled diabetes. He might benefit from a more potent statin but I think that prior to now is to control his diabetes.  Lab Results  Component Value Date   CHOL 100 03/15/2011   HDL 28.30* 03/15/2011   LDLDIRECT 47.7 03/15/2011   TRIG 240.0* 03/15/2011   CHOLHDL 4 03/15/2011

## 2011-08-10 NOTE — Assessment & Plan Note (Signed)
His blood pressure is well controlled. Continue current medications.

## 2011-08-10 NOTE — Assessment & Plan Note (Signed)
The patient seems to be doing reasonably well from a cardiac standpoint. He had a few episodes of chest pain. Most of his problems at this time seems to be related to depression and difficulty in losing weight. Continue current cardiac management. He already sees a psychologist for his depression.

## 2011-08-13 ENCOUNTER — Encounter: Payer: Self-pay | Admitting: Internal Medicine

## 2011-08-13 ENCOUNTER — Ambulatory Visit (INDEPENDENT_AMBULATORY_CARE_PROVIDER_SITE_OTHER): Payer: Medicare Other | Admitting: Internal Medicine

## 2011-08-13 DIAGNOSIS — D510 Vitamin B12 deficiency anemia due to intrinsic factor deficiency: Secondary | ICD-10-CM

## 2011-08-13 DIAGNOSIS — R252 Cramp and spasm: Secondary | ICD-10-CM | POA: Diagnosis not present

## 2011-08-13 DIAGNOSIS — R5383 Other fatigue: Secondary | ICD-10-CM | POA: Diagnosis not present

## 2011-08-13 DIAGNOSIS — I70229 Atherosclerosis of native arteries of extremities with rest pain, unspecified extremity: Secondary | ICD-10-CM | POA: Diagnosis not present

## 2011-08-13 DIAGNOSIS — R5381 Other malaise: Secondary | ICD-10-CM

## 2011-08-13 DIAGNOSIS — E119 Type 2 diabetes mellitus without complications: Secondary | ICD-10-CM

## 2011-08-13 DIAGNOSIS — F32A Depression, unspecified: Secondary | ICD-10-CM

## 2011-08-13 DIAGNOSIS — D649 Anemia, unspecified: Secondary | ICD-10-CM

## 2011-08-13 DIAGNOSIS — I70219 Atherosclerosis of native arteries of extremities with intermittent claudication, unspecified extremity: Secondary | ICD-10-CM | POA: Diagnosis not present

## 2011-08-13 DIAGNOSIS — F329 Major depressive disorder, single episode, unspecified: Secondary | ICD-10-CM

## 2011-08-13 LAB — CBC WITH DIFFERENTIAL/PLATELET
Basophils Absolute: 0 10*3/uL (ref 0.0–0.1)
Basophils Relative: 0.4 % (ref 0.0–3.0)
Eosinophils Absolute: 0.2 10*3/uL (ref 0.0–0.7)
Eosinophils Relative: 2.6 % (ref 0.0–5.0)
HCT: 37.4 % — ABNORMAL LOW (ref 39.0–52.0)
Hemoglobin: 13 g/dL (ref 13.0–17.0)
Lymphocytes Relative: 18.2 % (ref 12.0–46.0)
Lymphs Abs: 1.6 10*3/uL (ref 0.7–4.0)
MCHC: 34.7 g/dL (ref 30.0–36.0)
MCV: 92.5 fl (ref 78.0–100.0)
Monocytes Absolute: 0.5 10*3/uL (ref 0.1–1.0)
Monocytes Relative: 5.1 % (ref 3.0–12.0)
Neutro Abs: 6.6 10*3/uL (ref 1.4–7.7)
Neutrophils Relative %: 73.7 % (ref 43.0–77.0)
Platelets: 196 10*3/uL (ref 150.0–400.0)
RBC: 4.04 Mil/uL — ABNORMAL LOW (ref 4.22–5.81)
RDW: 14.5 % (ref 11.5–14.6)
WBC: 9 10*3/uL (ref 4.5–10.5)

## 2011-08-13 LAB — COMPREHENSIVE METABOLIC PANEL
ALT: 19 U/L (ref 0–53)
AST: 17 U/L (ref 0–37)
Albumin: 3.6 g/dL (ref 3.5–5.2)
Alkaline Phosphatase: 122 U/L — ABNORMAL HIGH (ref 39–117)
BUN: 22 mg/dL (ref 6–23)
CO2: 26 mEq/L (ref 19–32)
Calcium: 8.8 mg/dL (ref 8.4–10.5)
Chloride: 99 mEq/L (ref 96–112)
Creatinine, Ser: 1.4 mg/dL (ref 0.4–1.5)
GFR: 57.23 mL/min — ABNORMAL LOW (ref >60.00)
Glucose, Bld: 404 mg/dL — ABNORMAL HIGH (ref 70–99)
Potassium: 4.8 mEq/L (ref 3.5–5.1)
Sodium: 133 mEq/L — ABNORMAL LOW (ref 135–145)
Total Bilirubin: 0.3 mg/dL (ref 0.3–1.2)
Total Protein: 7.2 g/dL (ref 6.0–8.3)

## 2011-08-13 LAB — VITAMIN B12: Vitamin B-12: 738 pg/mL (ref 211–911)

## 2011-08-13 LAB — TSH: TSH: 1.68 u[IU]/mL (ref 0.35–5.50)

## 2011-08-13 MED ORDER — INSULIN GLARGINE 100 UNIT/ML ~~LOC~~ SOLN: 60.0000 [IU] | Freq: Two times a day (BID) | SUBCUTANEOUS | Status: DC

## 2011-08-13 NOTE — Progress Notes (Addendum)
Subjective:    Patient ID: [name] [name], male    DOB: 12-Dec-1955, 56 y.o.   MRN: [id number]  HPI 56 year old male with a history of diabetes, hypertension, hyperlipidemia, depression, and coronary artery disease presents for followup. In regards to his diabetes, he brings record of his blood sugars today which show most of his sugars between 150 and 250. He reports full compliance with his Lantus 50 units twice daily. He denies any low blood sugars less than 80. He has had some elevated blood sugars as high as 3 or 400. He denies any polyuria or polyphasia. He reports compliance with his diet.  In regards to his depression and anxiety he notes some improvement with counseling. He has been encouraged to seek activities outside of his home in an effort to improve his depression, however activity has been limited because of his recent medical issues. He reports full compliance with his Cymbalta and Wellbutrin.  He is primarily concerned today about recent increase in fatigue. He reports that he sleeps well throughout the night. He reports he is compliant with his CPAP. However, he notes significant daytime somnolence. He denies any symptoms suggestive of viral infection such as fever, chills, cough, congestion. He denies any change in his bowel habits or appetite. He denies chest pain or shortness of breath. He does not feel that his depression is contributing to fatigue.  Outpatient Encounter Prescriptions as of 08/13/2011  Medication Sig Dispense Refill  . aspirin 325 MG tablet Take 325 mg by mouth daily.        [name] buPROPion (WELLBUTRIN XL) 150 MG 24 hr tablet take 1 tablet by mouth once daily  30 tablet  6  . cholecalciferol (VITAMIN D) 1000 UNITS tablet Take 1,000 Units by mouth daily.        . clopidogrel (PLAVIX) 75 MG tablet Take 1 tablet (75 mg total) by mouth daily.  90 tablet  4  . cyclobenzaprine (FLEXERIL) 10 MG tablet take 1 tablet by mouth three times a day AS NEEDED  90 tablet  3  .  diazepam (VALIUM) 2 MG tablet take 1 tablet by mouth every 8 hours if needed for anxiety  90 tablet  2  . diazepam (VALIUM) 5 MG tablet take 1 tablet by mouth at bedtime  30 tablet  4  . DULoxetine (CYMBALTA) 60 MG capsule Take 1 capsule (60 mg total) by mouth daily.  90 capsule  1  . glucose blood (BAYER CONTOUR TEST) test strip 1 each by Other route 3 (three) times daily as needed. Dx: 250.00  100 each  12  . HYDROcodone-acetaminophen (VICODIN) 5-500 MG per tablet Take 2 tablets by mouth every 6 (six) hours as needed for pain.  20 tablet  0  . insulin glargine (LANTUS SOLOSTAR) 100 UNIT/ML injection Inject 60 Units into the skin 2 (two) times daily.  10 mL  6  . Lactulose 20 GM/30ML SOLN Take 30 mLs (20 g total) by mouth every 2 (two) hours as needed (until BM).  500 mL  0  . Liraglutide (VICTOZA) 18 MG/3ML SOLN Inject 0.2 mLs (1.2 mg total) into the skin daily.  6 mL  3  . metoprolol (TOPROL-XL) 50 MG 24 hr tablet Take 1 tablet (50 mg total) by mouth 2 (two) times daily.  60 tablet  6  . Multiple Vitamin (MULTIVITAMIN) capsule Take 1 capsule by mouth daily.        . nitroGLYCERIN (NITROSTAT) 0.4 MG SL tablet Place 1 tablet (0.4 mg  total) under the tongue every 5 (five) minutes as needed for chest pain.  25 tablet  6  . NOVOFINE 32G X 6 MM MISC as directed.      . simvastatin (ZOCOR) 20 MG tablet take 1 tablet by mouth once daily  30 tablet  3  . traMADol (ULTRAM) 50 MG tablet Take 50 mg by mouth every 6 (six) hours as needed. TAKE 1-2       . DISCONTD: insulin glargine (LANTUS SOLOSTAR) 100 UNIT/ML injection Inject 50 Units into the skin 2 (two) times daily.  10 mL  6    Review of Systems  Constitutional: Positive for fatigue. Negative for fever, chills, activity change, appetite change and unexpected weight change.  Eyes: Negative for visual disturbance.  Respiratory: Negative for cough and shortness of breath.   Cardiovascular: Negative for chest pain, palpitations and leg swelling.    Gastrointestinal: Negative for abdominal pain and abdominal distention.  Genitourinary: Negative for dysuria, urgency and difficulty urinating.  Musculoskeletal: Negative for arthralgias and gait problem.  Skin: Negative for color change and rash.  Hematological: Negative for adenopathy.  Psychiatric/Behavioral: Positive for dysphoric mood. Negative for sleep disturbance. The patient is nervous/anxious.    BP 128/72  Pulse 67  Temp(Src) 97.7 F (36.5 C) (Oral)  Ht 5\' 6"  (1.676 m)  Wt 276 lb (125.193 kg)  BMI 44.55 kg/m2  SpO2 99%     Objective:   Physical Exam  Constitutional: He is oriented to person, place, and time. He appears well-developed and well-nourished. No distress.  HENT:  Head: Normocephalic and atraumatic.  Right Ear: External ear normal.  Left Ear: External ear normal.  Nose: Nose normal.  Mouth/Throat: Oropharynx is clear and moist. No oropharyngeal exudate.  Eyes: Conjunctivae and EOM are normal. Pupils are equal, round, and reactive to light. Right eye exhibits no discharge. Left eye exhibits no discharge. No scleral icterus.  Neck: Normal range of motion. Neck supple. No tracheal deviation present. No thyromegaly present.  Cardiovascular: Normal rate and regular rhythm.  Exam reveals no gallop and no friction rub.   Murmur heard.  Systolic murmur is present  Pulmonary/Chest: Effort normal and breath sounds normal. No respiratory distress. He has no wheezes. He has no rales. He exhibits no tenderness.  Musculoskeletal: Normal range of motion. He exhibits no edema.  Lymphadenopathy:    He has no cervical adenopathy.  Neurological: He is alert and oriented to person, place, and time. No cranial nerve deficit. Coordination normal.  Skin: Skin is warm and dry. No rash noted. He is not diaphoretic. No erythema. No pallor.  Psychiatric: He has a normal mood and affect. His behavior is normal. Judgment and thought content normal.          Assessment & Plan:   and

## 2011-08-13 NOTE — Assessment & Plan Note (Signed)
Encouraged him to continue with counseling. Also encouraged him to seek activities outside the home. We discussed volunteer activities such as volunteering at a [employer]. He will look into these activities once he is cleared by his vascular surgeon.

## 2011-08-13 NOTE — Assessment & Plan Note (Signed)
Recent blood sugars elevated. Will increase Lantus to 60 units twice daily. Patient will followup in one month. He will e-mail or call with blood sugar report.

## 2011-08-13 NOTE — Assessment & Plan Note (Signed)
Unclear etiology of fatigue. Likely multifactorial. Contributing factors include obesity, depression, coronary artery disease, sleep apnea. Question also if he may be anemic. He has a history of anemia in the past. Will check CBC, TSH, CMP with labs today. If these labs are normal, would like to get a repeat sleep study. Patient will followup in one month or sooner if needed.

## 2011-08-13 NOTE — Patient Instructions (Signed)
Increase Lantus to 60units twice daily. Call if any blood sugar >250 or <80. Follow up 1 month.

## 2011-08-14 DIAGNOSIS — L408 Other psoriasis: Secondary | ICD-10-CM | POA: Diagnosis not present

## 2011-08-15 ENCOUNTER — Ambulatory Visit (INDEPENDENT_AMBULATORY_CARE_PROVIDER_SITE_OTHER): Payer: Medicare Other | Admitting: Psychology

## 2011-08-15 DIAGNOSIS — F4310 Post-traumatic stress disorder, unspecified: Secondary | ICD-10-CM | POA: Diagnosis not present

## 2011-08-15 DIAGNOSIS — F331 Major depressive disorder, recurrent, moderate: Secondary | ICD-10-CM | POA: Diagnosis not present

## 2011-08-17 DIAGNOSIS — L408 Other psoriasis: Secondary | ICD-10-CM | POA: Diagnosis not present

## 2011-08-22 ENCOUNTER — Ambulatory Visit (INDEPENDENT_AMBULATORY_CARE_PROVIDER_SITE_OTHER): Payer: Medicare Other | Admitting: Psychology

## 2011-08-22 DIAGNOSIS — F4310 Post-traumatic stress disorder, unspecified: Secondary | ICD-10-CM

## 2011-08-22 DIAGNOSIS — F331 Major depressive disorder, recurrent, moderate: Secondary | ICD-10-CM

## 2011-08-22 DIAGNOSIS — L408 Other psoriasis: Secondary | ICD-10-CM | POA: Diagnosis not present

## 2011-08-24 ENCOUNTER — Ambulatory Visit: Payer: Self-pay | Admitting: Vascular Surgery

## 2011-08-24 DIAGNOSIS — I10 Essential (primary) hypertension: Secondary | ICD-10-CM | POA: Diagnosis not present

## 2011-08-24 DIAGNOSIS — I2510 Atherosclerotic heart disease of native coronary artery without angina pectoris: Secondary | ICD-10-CM | POA: Diagnosis not present

## 2011-08-24 DIAGNOSIS — I70229 Atherosclerosis of native arteries of extremities with rest pain, unspecified extremity: Secondary | ICD-10-CM | POA: Diagnosis not present

## 2011-08-24 DIAGNOSIS — E119 Type 2 diabetes mellitus without complications: Secondary | ICD-10-CM | POA: Diagnosis not present

## 2011-08-24 DIAGNOSIS — Z79899 Other long term (current) drug therapy: Secondary | ICD-10-CM | POA: Diagnosis not present

## 2011-08-24 DIAGNOSIS — Z9889 Other specified postprocedural states: Secondary | ICD-10-CM | POA: Diagnosis not present

## 2011-08-24 DIAGNOSIS — R252 Cramp and spasm: Secondary | ICD-10-CM | POA: Diagnosis not present

## 2011-08-24 DIAGNOSIS — E7800 Pure hypercholesterolemia, unspecified: Secondary | ICD-10-CM | POA: Diagnosis not present

## 2011-08-24 DIAGNOSIS — Z87891 Personal history of nicotine dependence: Secondary | ICD-10-CM | POA: Diagnosis not present

## 2011-08-24 LAB — BASIC METABOLIC PANEL
Anion Gap: 10 (ref 7–16)
BUN: 25 mg/dL — ABNORMAL HIGH (ref 7–18)
Calcium, Total: 8.5 mg/dL (ref 8.5–10.1)
Chloride: 99 mmol/L (ref 98–107)
Co2: 27 mmol/L (ref 21–32)
Creatinine: 1.41 mg/dL — ABNORMAL HIGH (ref 0.60–1.30)
EGFR (African American): >60
EGFR (Non-African Amer.): 55 — ABNORMAL LOW
Glucose: 268 mg/dL — ABNORMAL HIGH (ref 65–99)
Osmolality: 286 (ref 275–301)
Potassium: 4.6 mmol/L (ref 3.5–5.1)
Sodium: 136 mmol/L (ref 136–145)

## 2011-08-29 ENCOUNTER — Ambulatory Visit: Payer: Medicare Other | Admitting: Psychology

## 2011-09-04 ENCOUNTER — Telehealth: Payer: Self-pay | Admitting: *Deleted

## 2011-09-04 NOTE — Telephone Encounter (Signed)
Triage Record Num: [id number] Operator: Ozella Almond Patient Name: [name] [name] Call Date & Time: 09/04/2011 4:55:29PM Patient Phone: [phone] PCP: Ronette Deter Patient Gender: Male PCP Fax : [phone] Patient DOB: 1956-01-30 Practice Name: [facility] Reason for Call: Caller: Rainen/Patient; PCP: Ronette Deter; CB#: [phone]; ; ; Call regarding Diabetic, Blood Sugar Problems THE PATIENT REFUSED 911; Pt calling regarding he "blacked out" about 1 hour ago at 1600 was out for about 30 min or less. States he remember hearing his dark barking. Current BS is 353. Takes Lantus 60 units BID. Pt states he was laying in bed and blacked out, states he feels "Ok" at this time states may have been a seizure. Advised to call 911 due to seizure activity in the past 6 hours per Diabetes:Control Problems. Protocol(s) Used: Diabetes: Control Problems Recommended Outcome per Protocol: Activate EMS 911 Reason for Outcome: New seizure now or within last 6 hours Care Advice: ~ If testing equipment is available, measure blood sugar AFTER calling EMS 911. ~ IMMEDIATE ACTION 09/04/2011 5:06:01PM Page 1 of 1 [email or web address]

## 2011-09-05 ENCOUNTER — Ambulatory Visit: Payer: Medicare Other | Admitting: Psychology

## 2011-09-05 NOTE — Telephone Encounter (Signed)
Can we check on him today?

## 2011-09-05 NOTE — Telephone Encounter (Signed)
Spoke with patient and he says that he is feeling completely better. He says that his BS is down to 242 today.

## 2011-09-12 ENCOUNTER — Ambulatory Visit (INDEPENDENT_AMBULATORY_CARE_PROVIDER_SITE_OTHER): Payer: Medicare Other | Admitting: Psychology

## 2011-09-12 DIAGNOSIS — F331 Major depressive disorder, recurrent, moderate: Secondary | ICD-10-CM | POA: Diagnosis not present

## 2011-09-12 DIAGNOSIS — F4310 Post-traumatic stress disorder, unspecified: Secondary | ICD-10-CM | POA: Diagnosis not present

## 2011-09-13 DIAGNOSIS — L408 Other psoriasis: Secondary | ICD-10-CM | POA: Diagnosis not present

## 2011-09-17 ENCOUNTER — Encounter: Payer: Self-pay | Admitting: Internal Medicine

## 2011-09-17 ENCOUNTER — Ambulatory Visit (INDEPENDENT_AMBULATORY_CARE_PROVIDER_SITE_OTHER): Payer: Medicare Other | Admitting: Internal Medicine

## 2011-09-17 VITALS — BP 132/70 | HR 72 | Temp 98.1°F | Ht 66.0 in | Wt 276.0 lb

## 2011-09-17 DIAGNOSIS — F329 Major depressive disorder, single episode, unspecified: Secondary | ICD-10-CM

## 2011-09-17 DIAGNOSIS — E119 Type 2 diabetes mellitus without complications: Secondary | ICD-10-CM | POA: Diagnosis not present

## 2011-09-17 DIAGNOSIS — F32A Depression, unspecified: Secondary | ICD-10-CM

## 2011-09-17 DIAGNOSIS — E1159 Type 2 diabetes mellitus with other circulatory complications: Secondary | ICD-10-CM | POA: Diagnosis not present

## 2011-09-17 MED ORDER — INSULIN GLARGINE 100 UNIT/ML ~~LOC~~ SOLN: 70.0000 [IU] | Freq: Two times a day (BID) | SUBCUTANEOUS | Status: DC

## 2011-09-17 NOTE — Assessment & Plan Note (Signed)
Recent blood sugars are elevated. Patient stopped using Victoza, we'll have him restart this medication. Samples were given today. He will also increase his Lantus to 70 units twice daily. Encouraged him to limit intake of processed carbohydrates. Followup in 2 months.

## 2011-09-17 NOTE — Assessment & Plan Note (Signed)
Encouraged him to continue with counseling and to continue to seek participation in activities outside of his home. Followup in 2 months.

## 2011-09-17 NOTE — Progress Notes (Signed)
Subjective:    Patient ID: [name] [name], male    DOB: 04/19/1956, 56 y.o.   MRN: [id number]  HPI 56 year old male with a history of diabetes, CAD, depression presents for followup. In regards to his diabetes, he brings record of his blood sugars which shows they're elevated typically near 250-350. He notes that he recently stopped Victoza because of cost. He reports full compliance with his insulin. He has not had any low blood sugars. He has been trying to limit his dietary intake. In regards to his depression, he reports that symptoms are well-controlled with ongoing counseling. 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 He reports full compliance with his medicines. He reports that in general he is feeling well. He has no new complaints today.  Outpatient Encounter Prescriptions as of 09/17/2011  Medication Sig Dispense Refill  . aspirin 325 MG tablet Take 325 mg by mouth daily.        [name] buPROPion (WELLBUTRIN XL) 150 MG 24 hr tablet take 1 tablet by mouth once daily  30 tablet  6  . cholecalciferol (VITAMIN D) 1000 UNITS tablet Take 1,000 Units by mouth daily.        . clopidogrel (PLAVIX) 75 MG tablet Take 1 tablet (75 mg total) by mouth daily.  90 tablet  4  . cyclobenzaprine (FLEXERIL) 10 MG tablet take 1 tablet by mouth three times a day AS NEEDED  90 tablet  3  . diazepam (VALIUM) 2 MG tablet take 1 tablet by mouth every 8 hours if needed for anxiety  90 tablet  2  . diazepam (VALIUM) 5 MG tablet take 1 tablet by mouth at bedtime  30 tablet  4  . DULoxetine (CYMBALTA) 60 MG capsule Take 1 capsule (60 mg total) by mouth daily.  90 capsule  1  . glucose blood (BAYER CONTOUR TEST) test strip 1 each by Other route 3 (three) times daily as needed. Dx: 250.00  100 each  12  . HYDROcodone-acetaminophen (VICODIN) 5-500 MG per tablet Take 2 tablets by mouth every 6 (six) hours as needed for pain.  20 tablet  0  . insulin glargine (LANTUS  SOLOSTAR) 100 UNIT/ML injection Inject 70 Units into the skin 2 (two) times daily.  10 mL  6  . Lactulose 20 GM/30ML SOLN Take 30 mLs (20 g total) by mouth every 2 (two) hours as needed (until BM).  500 mL  0  . metoprolol (TOPROL-XL) 50 MG 24 hr tablet Take 1 tablet (50 mg total) by mouth 2 (two) times daily.  60 tablet  6  . Multiple Vitamin (MULTIVITAMIN) capsule Take 1 capsule by mouth daily.        . nitroGLYCERIN (NITROSTAT) 0.4 MG SL tablet Place 1 tablet (0.4 mg total) under the tongue every 5 (five) minutes as needed for chest pain.  25 tablet  6  . NOVOFINE 32G X 6 MM MISC as directed.      . simvastatin (ZOCOR) 20 MG tablet take 1 tablet by mouth once daily  30 tablet  3  . traMADol (ULTRAM) 50 MG tablet Take 50 mg by mouth every 6 (six) hours as needed. TAKE 1-2       . DISCONTD: insulin glargine (LANTUS SOLOSTAR) 100 UNIT/ML injection Inject 60 Units into the skin 2 (two) times daily.  10 mL  6  . Liraglutide (VICTOZA) 18 MG/3ML SOLN Inject 0.2 mLs (1.2 mg total) into the skin daily.  6 mL  3    Review of Systems  Constitutional: Negative for fever, chills, activity change, appetite change, fatigue and unexpected weight change.  Eyes: Negative for visual disturbance.  Respiratory: Negative for cough and shortness of breath.   Cardiovascular: Negative for chest pain, palpitations and leg swelling.  Gastrointestinal: Negative for abdominal pain and abdominal distention.  Genitourinary: Negative for dysuria, urgency and difficulty urinating.  Musculoskeletal: Negative for arthralgias and gait problem.  Skin: Negative for color change and rash.  Hematological: Negative for adenopathy.  Psychiatric/Behavioral: Positive for dysphoric mood. Negative for sleep disturbance. The patient is nervous/anxious.    BP 132/70  Pulse 72  Temp(Src) 98.1 F (36.7 C) (Oral)  Ht 5\' 6"  (1.676 m)  Wt 276 lb (125.193 kg)  BMI 44.55 kg/m2  SpO2 98%     Objective:   Physical Exam    Constitutional: He is oriented to person, place, and time. He appears well-developed and well-nourished. No distress.  HENT:  Head: Normocephalic and atraumatic.  Right Ear: External ear normal.  Left Ear: External ear normal.  Nose: Nose normal.  Mouth/Throat: Oropharynx is clear and moist. No oropharyngeal exudate.  Eyes: Conjunctivae and EOM are normal. Pupils are equal, round, and reactive to light. Right eye exhibits no discharge. Left eye exhibits no discharge. No scleral icterus.  Neck: Normal range of motion. Neck supple. No tracheal deviation present. No thyromegaly present.  Cardiovascular: Normal rate, regular rhythm and normal heart sounds.  Exam reveals no gallop and no friction rub.   No murmur heard. Pulmonary/Chest: Effort normal and breath sounds normal. No respiratory distress. He has no wheezes. He has no rales. He exhibits no tenderness.  Musculoskeletal: Normal range of motion. He exhibits no edema.  Lymphadenopathy:    He has no cervical adenopathy.  Neurological: He is alert and oriented to person, place, and time. No cranial nerve deficit. Coordination normal.  Skin: Skin is warm and dry. Rash (plaque over arms, face, trunk, c/w psoriasis) noted. He is not diaphoretic. No erythema. No pallor.  Psychiatric: He has a normal mood and affect. His behavior is normal. Judgment and thought content normal.          Assessment & Plan:

## 2011-09-19 ENCOUNTER — Ambulatory Visit (INDEPENDENT_AMBULATORY_CARE_PROVIDER_SITE_OTHER): Payer: Medicare Other | Admitting: Psychology

## 2011-09-19 DIAGNOSIS — F4310 Post-traumatic stress disorder, unspecified: Secondary | ICD-10-CM

## 2011-09-19 DIAGNOSIS — F331 Major depressive disorder, recurrent, moderate: Secondary | ICD-10-CM

## 2011-09-20 DIAGNOSIS — L408 Other psoriasis: Secondary | ICD-10-CM | POA: Diagnosis not present

## 2011-09-23 ENCOUNTER — Encounter: Payer: Self-pay | Admitting: Internal Medicine

## 2011-09-24 DIAGNOSIS — I10 Essential (primary) hypertension: Secondary | ICD-10-CM | POA: Diagnosis not present

## 2011-09-24 DIAGNOSIS — I70229 Atherosclerosis of native arteries of extremities with rest pain, unspecified extremity: Secondary | ICD-10-CM | POA: Diagnosis not present

## 2011-09-24 DIAGNOSIS — I70219 Atherosclerosis of native arteries of extremities with intermittent claudication, unspecified extremity: Secondary | ICD-10-CM | POA: Diagnosis not present

## 2011-09-26 ENCOUNTER — Other Ambulatory Visit: Payer: Self-pay | Admitting: Internal Medicine

## 2011-09-29 ENCOUNTER — Other Ambulatory Visit: Payer: Self-pay | Admitting: Internal Medicine

## 2011-10-04 ENCOUNTER — Ambulatory Visit (INDEPENDENT_AMBULATORY_CARE_PROVIDER_SITE_OTHER): Payer: Medicare Other | Admitting: Psychology

## 2011-10-04 DIAGNOSIS — F4310 Post-traumatic stress disorder, unspecified: Secondary | ICD-10-CM

## 2011-10-04 DIAGNOSIS — F331 Major depressive disorder, recurrent, moderate: Secondary | ICD-10-CM

## 2011-10-10 ENCOUNTER — Ambulatory Visit: Payer: Medicare Other | Admitting: Psychology

## 2011-10-12 ENCOUNTER — Telehealth: Payer: Self-pay | Admitting: Internal Medicine

## 2011-10-12 NOTE — Telephone Encounter (Signed)
PT WANTED TO KNOW IF YOU HAVE SAMPLES VICTOZA

## 2011-10-12 NOTE — Telephone Encounter (Signed)
Left message notifying patient that we do not have samples at this time.

## 2011-10-17 ENCOUNTER — Ambulatory Visit (INDEPENDENT_AMBULATORY_CARE_PROVIDER_SITE_OTHER): Payer: Medicare Other | Admitting: Psychology

## 2011-10-17 DIAGNOSIS — F4310 Post-traumatic stress disorder, unspecified: Secondary | ICD-10-CM

## 2011-10-17 DIAGNOSIS — F331 Major depressive disorder, recurrent, moderate: Secondary | ICD-10-CM

## 2011-10-24 ENCOUNTER — Ambulatory Visit (INDEPENDENT_AMBULATORY_CARE_PROVIDER_SITE_OTHER): Payer: Medicare Other | Admitting: Psychology

## 2011-10-24 DIAGNOSIS — F331 Major depressive disorder, recurrent, moderate: Secondary | ICD-10-CM | POA: Diagnosis not present

## 2011-10-24 DIAGNOSIS — F4310 Post-traumatic stress disorder, unspecified: Secondary | ICD-10-CM

## 2011-10-31 ENCOUNTER — Ambulatory Visit (INDEPENDENT_AMBULATORY_CARE_PROVIDER_SITE_OTHER): Payer: Medicare Other | Admitting: Psychology

## 2011-10-31 DIAGNOSIS — F4310 Post-traumatic stress disorder, unspecified: Secondary | ICD-10-CM | POA: Diagnosis not present

## 2011-10-31 DIAGNOSIS — F331 Major depressive disorder, recurrent, moderate: Secondary | ICD-10-CM | POA: Diagnosis not present

## 2011-11-02 DIAGNOSIS — M79609 Pain in unspecified limb: Secondary | ICD-10-CM | POA: Diagnosis not present

## 2011-11-02 DIAGNOSIS — B351 Tinea unguium: Secondary | ICD-10-CM | POA: Diagnosis not present

## 2011-11-06 DIAGNOSIS — L408 Other psoriasis: Secondary | ICD-10-CM | POA: Diagnosis not present

## 2011-11-07 ENCOUNTER — Ambulatory Visit (INDEPENDENT_AMBULATORY_CARE_PROVIDER_SITE_OTHER): Payer: Medicare Other | Admitting: Psychology

## 2011-11-07 DIAGNOSIS — F4310 Post-traumatic stress disorder, unspecified: Secondary | ICD-10-CM

## 2011-11-07 DIAGNOSIS — F331 Major depressive disorder, recurrent, moderate: Secondary | ICD-10-CM | POA: Diagnosis not present

## 2011-11-08 ENCOUNTER — Telehealth: Payer: Self-pay | Admitting: Internal Medicine

## 2011-11-08 ENCOUNTER — Other Ambulatory Visit: Payer: Self-pay | Admitting: Internal Medicine

## 2011-11-08 DIAGNOSIS — M549 Dorsalgia, unspecified: Secondary | ICD-10-CM

## 2011-11-08 MED ORDER — OXYCODONE-ACETAMINOPHEN 5-325 MG PO TABS: 1.0000 | ORAL_TABLET | Freq: Three times a day (TID) | ORAL | Status: DC | PRN

## 2011-11-08 NOTE — Telephone Encounter (Signed)
We can try a short course of percocet, but if symptoms this severe, and persistent, will need to be seen.

## 2011-11-08 NOTE — Telephone Encounter (Signed)
He has used Vicodin before for severe back pain.  Would he be willing to try this again? Or, has he already used this?

## 2011-11-08 NOTE — Telephone Encounter (Signed)
Patient notified. Rx up front for pick up .

## 2011-11-08 NOTE — Telephone Encounter (Signed)
Patient says that he feels that he needs something a little stronger. He says that he is in so much pain it hurts to even use the bathroom. Please advise.

## 2011-11-08 NOTE — Telephone Encounter (Signed)
[id number] Pt called wanted to know if you could write him a something for back pain.  He has not sleep in 2 night.  Please advise

## 2011-11-09 DIAGNOSIS — L408 Other psoriasis: Secondary | ICD-10-CM | POA: Diagnosis not present

## 2011-11-12 DIAGNOSIS — L408 Other psoriasis: Secondary | ICD-10-CM | POA: Diagnosis not present

## 2011-11-13 DIAGNOSIS — E119 Type 2 diabetes mellitus without complications: Secondary | ICD-10-CM | POA: Diagnosis not present

## 2011-11-13 LAB — HM DIABETES EYE EXAM

## 2011-11-14 ENCOUNTER — Ambulatory Visit (INDEPENDENT_AMBULATORY_CARE_PROVIDER_SITE_OTHER): Payer: Medicare Other | Admitting: Psychology

## 2011-11-14 DIAGNOSIS — F331 Major depressive disorder, recurrent, moderate: Secondary | ICD-10-CM | POA: Diagnosis not present

## 2011-11-14 DIAGNOSIS — F4310 Post-traumatic stress disorder, unspecified: Secondary | ICD-10-CM

## 2011-11-15 DIAGNOSIS — L408 Other psoriasis: Secondary | ICD-10-CM | POA: Diagnosis not present

## 2011-11-16 ENCOUNTER — Ambulatory Visit (INDEPENDENT_AMBULATORY_CARE_PROVIDER_SITE_OTHER)
Admission: RE | Admit: 2011-11-16 | Discharge: 2011-11-16 | Disposition: A | Discharge status: Home / Self Care | Payer: Medicare Other | Source: Ambulatory Visit | Attending: Internal Medicine | Admitting: Internal Medicine

## 2011-11-16 ENCOUNTER — Ambulatory Visit (INDEPENDENT_AMBULATORY_CARE_PROVIDER_SITE_OTHER): Payer: Medicare Other | Admitting: Internal Medicine

## 2011-11-16 ENCOUNTER — Encounter: Payer: Self-pay | Admitting: Internal Medicine

## 2011-11-16 VITALS — BP 124/79 | HR 78 | Ht 66.0 in | Wt 278.0 lb

## 2011-11-16 DIAGNOSIS — M549 Dorsalgia, unspecified: Secondary | ICD-10-CM

## 2011-11-16 DIAGNOSIS — E785 Hyperlipidemia, unspecified: Secondary | ICD-10-CM

## 2011-11-16 DIAGNOSIS — F3289 Other specified depressive episodes: Secondary | ICD-10-CM

## 2011-11-16 DIAGNOSIS — F329 Major depressive disorder, single episode, unspecified: Secondary | ICD-10-CM

## 2011-11-16 DIAGNOSIS — E119 Type 2 diabetes mellitus without complications: Secondary | ICD-10-CM

## 2011-11-16 DIAGNOSIS — I10 Essential (primary) hypertension: Secondary | ICD-10-CM | POA: Diagnosis not present

## 2011-11-16 DIAGNOSIS — F32A Depression, unspecified: Secondary | ICD-10-CM

## 2011-11-16 DIAGNOSIS — M47817 Spondylosis without myelopathy or radiculopathy, lumbosacral region: Secondary | ICD-10-CM | POA: Diagnosis not present

## 2011-11-16 DIAGNOSIS — I798 Other disorders of arteries, arterioles and capillaries in diseases classified elsewhere: Secondary | ICD-10-CM

## 2011-11-16 DIAGNOSIS — E1159 Type 2 diabetes mellitus with other circulatory complications: Secondary | ICD-10-CM

## 2011-11-16 LAB — COMPREHENSIVE METABOLIC PANEL
ALT: 17 U/L (ref 0–53)
AST: 12 U/L (ref 0–37)
Albumin: 3.6 g/dL (ref 3.5–5.2)
Alkaline Phosphatase: 105 U/L (ref 39–117)
BUN: 16 mg/dL (ref 6–23)
CO2: 25 mEq/L (ref 19–32)
Calcium: 9 mg/dL (ref 8.4–10.5)
Chloride: 104 mEq/L (ref 96–112)
Creatinine, Ser: 1.3 mg/dL (ref 0.4–1.5)
GFR: 61.32 mL/min (ref >60.00)
Glucose, Bld: 129 mg/dL — ABNORMAL HIGH (ref 70–99)
Potassium: 4.2 mEq/L (ref 3.5–5.1)
Sodium: 138 mEq/L (ref 135–145)
Total Bilirubin: 0.4 mg/dL (ref 0.3–1.2)
Total Protein: 7.4 g/dL (ref 6.0–8.3)

## 2011-11-16 LAB — LDL CHOLESTEROL, DIRECT: Direct LDL: 47.6 mg/dL

## 2011-11-16 LAB — LIPID PANEL
Cholesterol: 99 mg/dL (ref 0–200)
HDL: 21.1 mg/dL — ABNORMAL LOW (ref >39.00)
Total CHOL/HDL Ratio: 5
Triglycerides: 213 mg/dL — ABNORMAL HIGH (ref 0.0–149.0)
VLDL: 42.6 mg/dL — ABNORMAL HIGH (ref 0.0–40.0)

## 2011-11-16 LAB — HEMOGLOBIN A1C: Hgb A1c MFr Bld: 7.9 % — ABNORMAL HIGH (ref 4.6–6.5)

## 2011-11-16 MED ORDER — OXYCODONE-ACETAMINOPHEN 5-325 MG PO TABS: 2.0000 | ORAL_TABLET | Freq: Three times a day (TID) | ORAL | Status: AC | PRN

## 2011-11-16 MED ORDER — LIDOCAINE 5 % EX PTCH: 1.0000 | MEDICATED_PATCH | CUTANEOUS | Status: DC

## 2011-11-16 MED ORDER — SIMVASTATIN 20 MG PO TABS: 20.0000 mg | ORAL_TABLET | Freq: Every day | ORAL | Status: DC

## 2011-11-16 MED ORDER — METOPROLOL SUCCINATE ER 50 MG PO TB24: 50.0000 mg | ORAL_TABLET | Freq: Two times a day (BID) | ORAL | Status: DC

## 2011-11-16 NOTE — Assessment & Plan Note (Signed)
Will check lipids with labs today. Goal LDL less than 70. 

## 2011-11-16 NOTE — Assessment & Plan Note (Signed)
Symptoms much improved with recent counseling. Will continue.

## 2011-11-16 NOTE — Assessment & Plan Note (Addendum)
Recent blood sugars well controlled. Will continue Victoza. Samples were given today. We'll continue Lantus. Will check hemoglobin A1c with labs today. We discussed the need for weight loss. Encouraged him to increase his physical activity. Followup in one month.

## 2011-11-16 NOTE — Progress Notes (Signed)
Subjective:    Patient ID: [name] [name], male    DOB: August 19, 1955, 56 y.o.   MRN: [id number]  HPI 56 year old male with history of diabetes, hypertension, hyperlipidemia, and depression presents for followup. He reports that, in regards to his depression, his symptoms are much improved. His wife notes that he is more interactive and has been participating more in family activities. He notes that his symptoms of depressed mood had improved.  Regards to his diabetes, he reports that his blood sugars have been well-controlled. He reports fasting sugar today was 154. He did not bring a record of his other sugars. He reports compliance with his medications including insulin and Pitocin.  He notes that a couple of days ago he made a twisting motion with his back which caused significant lower back pain. He denies hearing any popping sound in his back. He reports moderate to severe cramping pain in his lower mid back. He denies any weakness in his legs. He has had chronic back pain in the past and has used Percocet with some improvement. He reports minimal improvement with the use of Percocet with this incident of pain.  Outpatient Encounter Prescriptions as of 11/16/2011  Medication Sig Dispense Refill  . aspirin EC 81 MG tablet Take 81 mg by mouth daily.      [name] buPROPion (WELLBUTRIN XL) 150 MG 24 hr tablet take 1 tablet by mouth once daily  30 tablet  6  . cholecalciferol (VITAMIN D) 1000 UNITS tablet Take 1,000 Units by mouth daily.        . clopidogrel (PLAVIX) 75 MG tablet Take 1 tablet (75 mg total) by mouth daily.  90 tablet  4  . cyclobenzaprine (FLEXERIL) 10 MG tablet take 1 tablet by mouth three times a day AS NEEDED  90 tablet  3  . diazepam (VALIUM) 2 MG tablet take 1 tablet by mouth every 8 hours if needed for anxiety  90 tablet  2  . diazepam (VALIUM) 5 MG tablet take 1 tablet by mouth at bedtime  30 tablet  4  . DULoxetine (CYMBALTA) 60 MG capsule Take 1 capsule (60 mg total) by mouth  daily.  90 capsule  1  . glucose blood (BAYER CONTOUR TEST) test strip 1 each by Other route 3 (three) times daily as needed. Dx: 250.00  100 each  12  . HYDROcodone-acetaminophen (VICODIN) 5-500 MG per tablet Take 2 tablets by mouth every 6 (six) hours as needed for pain.  20 tablet  0  . insulin glargine (LANTUS) 100 UNIT/ML injection Inject 60 Units into the skin 2 (two) times daily.      . Lactulose 20 GM/30ML SOLN Take 30 mLs (20 g total) by mouth every 2 (two) hours as needed (until BM).  500 mL  0  . metoprolol succinate (TOPROL-XL) 50 MG 24 hr tablet Take 1 tablet (50 mg total) by mouth 2 (two) times daily.  60 tablet  6  . Multiple Vitamin (MULTIVITAMIN) capsule Take 1 capsule by mouth daily.        . nitroGLYCERIN (NITROSTAT) 0.4 MG SL tablet Place 1 tablet (0.4 mg total) under the tongue every 5 (five) minutes as needed for chest pain.  25 tablet  6  . NOVOFINE 32G X 6 MM MISC as directed.      [name] oxyCODONE-acetaminophen (ROXICET) 5-325 MG per tablet Take 2 tablets by mouth every 8 (eight) hours as needed for pain.  90 tablet  0  . simvastatin (ZOCOR) 20 MG  tablet Take 1 tablet (20 mg total) by mouth at bedtime.  30 tablet  6  . traMADol (ULTRAM) 50 MG tablet Take 50 mg by mouth every 6 (six) hours as needed. TAKE 1-2       . lidocaine (LIDODERM) 5 % Place 1 patch onto the skin daily. Remove & Discard patch within 12 hours or as directed by MD  30 patch  3  . Liraglutide (VICTOZA) 18 MG/3ML SOLN Inject 0.2 mLs (1.2 mg total) into the skin daily.  6 mL  3  . DISCONTD: aspirin 325 MG tablet Take 325 mg by mouth daily.          Review of Systems  Constitutional: Negative for fever, chills, activity change, appetite change, fatigue and unexpected weight change.  Eyes: Negative for visual disturbance.  Respiratory: Negative for cough and shortness of breath.   Cardiovascular: Negative for chest pain, palpitations and leg swelling.  Gastrointestinal: Negative for abdominal pain and  abdominal distention.  Genitourinary: Negative for dysuria, urgency and difficulty urinating.  Musculoskeletal: Positive for myalgias, back pain and arthralgias. Negative for gait problem.  Skin: Negative for color change and rash.  Hematological: Negative for adenopathy.  Psychiatric/Behavioral: Negative for sleep disturbance and dysphoric mood. The patient is not nervous/anxious.    BP 124/79  Pulse 78  Ht 5\' 6"  (1.676 m)  Wt 278 lb (126.1 kg)  BMI 44.87 kg/m2     Objective:   Physical Exam  Constitutional: He is oriented to person, place, and time. He appears well-developed and well-nourished. No distress.  HENT:  Head: Normocephalic and atraumatic.  Right Ear: External ear normal.  Left Ear: External ear normal.  Nose: Nose normal.  Mouth/Throat: Oropharynx is clear and moist. No oropharyngeal exudate.  Eyes: Conjunctivae and EOM are normal. Pupils are equal, round, and reactive to light. Right eye exhibits no discharge. Left eye exhibits no discharge. No scleral icterus.  Neck: Normal range of motion. Neck supple. No tracheal deviation present. No thyromegaly present.  Cardiovascular: Normal rate, regular rhythm and normal heart sounds.  Exam reveals no gallop and no friction rub.   No murmur heard. Pulmonary/Chest: Effort normal and breath sounds normal. No respiratory distress. He has no wheezes. He has no rales. He exhibits no tenderness.  Musculoskeletal: He exhibits no edema.       Lumbar back: He exhibits decreased range of motion, tenderness and pain.  Lymphadenopathy:    He has no cervical adenopathy.  Neurological: He is alert and oriented to person, place, and time. No cranial nerve deficit. Coordination normal.  Skin: Skin is warm and dry. No rash noted. He is not diaphoretic. No erythema. No pallor.  Psychiatric: He has a normal mood and affect. His behavior is normal. Judgment and thought content normal.          Assessment & Plan:

## 2011-11-16 NOTE — Assessment & Plan Note (Signed)
Likely muscular strain, however has known h/o OA, so will get plain xray to eval.  Will start lidoderm patch and use prn percocet for severe pain.

## 2011-11-19 DIAGNOSIS — L408 Other psoriasis: Secondary | ICD-10-CM | POA: Diagnosis not present

## 2011-11-21 DIAGNOSIS — L408 Other psoriasis: Secondary | ICD-10-CM | POA: Diagnosis not present

## 2011-11-26 DIAGNOSIS — L408 Other psoriasis: Secondary | ICD-10-CM | POA: Diagnosis not present

## 2011-11-28 ENCOUNTER — Ambulatory Visit (INDEPENDENT_AMBULATORY_CARE_PROVIDER_SITE_OTHER): Payer: Medicare Other | Admitting: Psychology

## 2011-11-28 DIAGNOSIS — F331 Major depressive disorder, recurrent, moderate: Secondary | ICD-10-CM

## 2011-11-28 DIAGNOSIS — F4310 Post-traumatic stress disorder, unspecified: Secondary | ICD-10-CM

## 2011-11-28 DIAGNOSIS — L408 Other psoriasis: Secondary | ICD-10-CM | POA: Diagnosis not present

## 2011-12-03 DIAGNOSIS — L408 Other psoriasis: Secondary | ICD-10-CM | POA: Diagnosis not present

## 2011-12-05 ENCOUNTER — Encounter (HOSPITAL_COMMUNITY): Payer: Self-pay

## 2011-12-05 ENCOUNTER — Ambulatory Visit (INDEPENDENT_AMBULATORY_CARE_PROVIDER_SITE_OTHER): Payer: Medicare Other | Admitting: Psychology

## 2011-12-05 ENCOUNTER — Emergency Department (HOSPITAL_COMMUNITY)
Admission: EM | Admit: 2011-12-05 | Discharge: 2011-12-05 | Disposition: A | Discharge status: Home / Self Care | Payer: Medicare Other | Attending: Emergency Medicine | Admitting: Emergency Medicine

## 2011-12-05 DIAGNOSIS — G8929 Other chronic pain: Secondary | ICD-10-CM | POA: Insufficient documentation

## 2011-12-05 DIAGNOSIS — M549 Dorsalgia, unspecified: Secondary | ICD-10-CM | POA: Diagnosis not present

## 2011-12-05 DIAGNOSIS — F4310 Post-traumatic stress disorder, unspecified: Secondary | ICD-10-CM

## 2011-12-05 DIAGNOSIS — E785 Hyperlipidemia, unspecified: Secondary | ICD-10-CM | POA: Diagnosis not present

## 2011-12-05 DIAGNOSIS — F3289 Other specified depressive episodes: Secondary | ICD-10-CM | POA: Insufficient documentation

## 2011-12-05 DIAGNOSIS — I2510 Atherosclerotic heart disease of native coronary artery without angina pectoris: Secondary | ICD-10-CM | POA: Insufficient documentation

## 2011-12-05 DIAGNOSIS — Z79899 Other long term (current) drug therapy: Secondary | ICD-10-CM | POA: Diagnosis not present

## 2011-12-05 DIAGNOSIS — Z794 Long term (current) use of insulin: Secondary | ICD-10-CM | POA: Insufficient documentation

## 2011-12-05 DIAGNOSIS — F329 Major depressive disorder, single episode, unspecified: Secondary | ICD-10-CM | POA: Insufficient documentation

## 2011-12-05 DIAGNOSIS — Z7982 Long term (current) use of aspirin: Secondary | ICD-10-CM | POA: Insufficient documentation

## 2011-12-05 DIAGNOSIS — I10 Essential (primary) hypertension: Secondary | ICD-10-CM | POA: Insufficient documentation

## 2011-12-05 DIAGNOSIS — I739 Peripheral vascular disease, unspecified: Secondary | ICD-10-CM | POA: Insufficient documentation

## 2011-12-05 DIAGNOSIS — F331 Major depressive disorder, recurrent, moderate: Secondary | ICD-10-CM | POA: Diagnosis not present

## 2011-12-05 NOTE — ED Notes (Signed)
Pt complains of back pain, chronic in nature, pt sts radiates everywhere and pt does not like taking his pain medicaiton

## 2011-12-05 NOTE — Discharge Instructions (Signed)
Contact Dr. Gilford Rile for the next available office appointment. Ask her about referral to a pain clinic. Take your Percocet for severe pain as needed

## 2011-12-05 NOTE — ED Provider Notes (Signed)
History  This chart was scribed for Orlie Dakin, MD by Jenne Campus. This patient was seen in room STRE1/STRE1 and the patient's care was started at 1:13PM.  CSN: [id number]  Arrival date & time 12/05/11  1252   First MD Initiated Contact with Patient 12/05/11 1313      Chief Complaint  Patient presents with  . Back Pain    The history is provided by the patient. No language interpreter was used.    [name] [name] is a 56 y.o. male who presents to the Emergency Department complaining of chronic back pain from 4 bulging discs. Pt states that the pain radiates to the shoulders and down to his tailbone. The pain is worse with walking. He was seen by his PCP 11/16/11 and told that the pain was from kidney stones. But upon his insisting, he states that his PCP is ordering an MRI to look for any new issues causing the pain. Pain is worse with movement. Pt states that he has neuropathy and walks with a cane. He reports trying percocet and lidoderm patches with moderate improvement. Pt states that he doesn't like taking them because he can't drive after he takes them. He also reports trying therapy with moderate improvement in pain. Pt states that he was getting pain medication shots but that this was stopped due to pt being on plavix. He has not looked into pain clinics. He is here looking for an alternative to taking pain medications. He also c/o not being able to keep his feet warm. He states that this a new problem which he attributes to his diabetes. He reports that he wears diabetic shoes with mild improvement. He checked his CBG at 144 yesterday. He denies loss of bowels or bladder. He denies any recent fevers. He has a h/o PVD, diabetes, depression, CAD, bell's palsy and HTN. He is a current everyday smoker but denies alcohol use.  PCP is Dr. Gilford Rile.  Past Medical History  Diagnosis Date  . Peripheral vascular disease   . Diabetes mellitus   . Mild aortic stenosis   . Tobacco use  disorder     recently quit  . Depression   . Psoriasis   . Neuropathy   . Obesity   . Post splenectomy syndrome   . SOB (shortness of breath)   . Carotid artery occlusion   . Coronary artery disease   . Hyperlipidemia   . Hypertension   . Bell's palsy 2007  . Sleep apnea   . Angina at rest     chronic  . Abnormal colonoscopy 06/2011    Dr. Minna Merritts, polyp    Past Surgical History  Procedure Date  . Splenectomy   . Heart stents Jan 2011    leg stents 06/2009 and 03/2010  . Coronary artery bypass graft 09/06/2010    At [facility]: LIMA to LAD, left radial to RCA, sequential SVG to OM3 and 4  . Cardiac catheterization 08/2010    LAD: 80% ISR, RCA: 80% ostial, OM 80-90%  . Coronary angioplasty     LAD: before CABG  . Carotid endarterectomy 08/2010    left/ Dr. Kellie Simmering  . Pta of illiac and sfa multiple    Dr. Ronalee Belts    Family History  Problem Relation Age of Onset  . Cancer Father 47  . Cancer Sister 48    History  Substance Use Topics  . Smoking status: Current Some Day Smoker -- 0.3 packs/day for 40 years    Types: Cigarettes  .  Smokeless tobacco: Never Used  . Alcohol Use: No      Review of Systems  Constitutional: Negative.   HENT: Negative.   Respiratory: Negative.   Cardiovascular: Negative.   Gastrointestinal: Negative.   Musculoskeletal: Positive for back pain.  Skin: Negative.   Neurological: Negative.   Hematological: Negative.   Psychiatric/Behavioral: Negative.     Allergies  Contrast media; Glipizide; and Penicillins  Home Medications   Current Outpatient Rx  Name Route Sig Dispense Refill  . ASPIRIN EC 81 MG PO TBEC Oral Take 81 mg by mouth daily.    . BUPROPION HCL ER (XL) 150 MG PO TB24  take 1 tablet by mouth once daily 30 tablet 6  . VITAMIN D 1000 UNITS PO TABS Oral Take 1,000 Units by mouth daily.      [name] CLOPIDOGREL BISULFATE 75 MG PO TABS Oral Take 1 tablet (75 mg total) by mouth daily. 90 tablet 4  . CYCLOBENZAPRINE HCL 10 MG PO  TABS  take 1 tablet by mouth three times a day AS NEEDED 90 tablet 3  . DIAZEPAM 2 MG PO TABS  take 1 tablet by mouth every 8 hours if needed for anxiety 90 tablet 2  . DIAZEPAM 5 MG PO TABS  take 1 tablet by mouth at bedtime 30 tablet 4  . DULOXETINE HCL 60 MG PO CPEP Oral Take 60 mg by mouth at bedtime.    . INSULIN GLARGINE 100 UNIT/ML Leonardo SOLN Subcutaneous Inject 60 Units into the skin 2 (two) times daily.    [name] LACTULOSE 20 GM/30ML PO SOLN Oral Take 30 mLs (20 g total) by mouth every 2 (two) hours as needed (until BM). 500 mL 0  . LIDOCAINE 5 % EX PTCH Transdermal Place 1 patch onto the skin daily as needed. As needed for pain.Remove & Discard patch within 12 hours or as directed by MD    . LIRAGLUTIDE 18 MG/3ML Nebo SOLN Subcutaneous Inject 0.2 mLs (1.2 mg total) into the skin daily. 6 mL 3  . METOPROLOL SUCCINATE ER 50 MG PO TB24 Oral Take 1 tablet (50 mg total) by mouth 2 (two) times daily. 60 tablet 6  . MULTIVITAMINS PO CAPS Oral Take 1 capsule by mouth daily.      [name] SIMVASTATIN 20 MG PO TABS Oral Take 1 tablet (20 mg total) by mouth at bedtime. 30 tablet 6  . TRAMADOL HCL 50 MG PO TABS Oral Take 50-100 mg by mouth every 6 (six) hours as needed. TAKE 1-2 tablets as needed for pain.    [name] GLUCOSE BLOOD VI STRP Other 1 each by Other route 3 (three) times daily as needed. Dx: 250.00 100 each 12  . NITROGLYCERIN 0.4 MG SL SUBL Sublingual Place 1 tablet (0.4 mg total) under the tongue every 5 (five) minutes as needed for chest pain. 25 tablet 6  . NOVOFINE 32G X 6 MM MISC  as directed.      Triage Vitals: BP 149/90  Pulse 77  Temp(Src) 98 F (36.7 C) (Oral)  Resp 18  SpO2 95%  Physical Exam  Nursing note and vitals reviewed. Constitutional: He is oriented to person, place, and time. He appears well-developed and well-nourished. No distress.       Morbidly obese  HENT:  Head: Normocephalic and atraumatic.  Eyes: EOM are normal.  Neck: Neck supple. No tracheal deviation present.    Cardiovascular: Normal rate and regular rhythm.   Pulmonary/Chest: Effort normal and breath sounds normal. No respiratory distress.  Abdominal: Soft. There is no tenderness.  Musculoskeletal: Normal range of motion. He exhibits tenderness.       Tender along entire spine  Neurological: He is alert and oriented to person, place, and time. He has normal reflexes.        Normal gait  Skin: Skin is warm and dry.  Psychiatric: He has a normal mood and affect. His behavior is normal.    ED Course  Procedures (including critical care time)  DIAGNOSTIC STUDIES: Oxygen Saturation is 95% on room air, adequate by my interpretation.    COORDINATION OF CARE: 1:27PM-Advised pt to take his pain medications as needed and to continue following up with his PCP. Also advised pt to look into getting into a pain clinic that would be better equipped in offering alternative therapies to pain medication.   Labs Reviewed - No data to display No results found.   No diagnosis found.    MDM  Back pain is chronic, seemingly musculoskeletal in etiology i.e. worse with movement and improved with remaining still lasting several years. Patient has Percocet which she can take at home . Suggest followup with Dr. Gilford Rile for referral to a pain clinic or further workup as needed Diagnosis chronic back pain      I personally performed the services described in this documentation, which was scribed in my presence. The recorded information has been reviewed and considered.  Orlie Dakin, MD 12/05/11 1341

## 2011-12-12 ENCOUNTER — Ambulatory Visit (INDEPENDENT_AMBULATORY_CARE_PROVIDER_SITE_OTHER): Payer: Medicare Other | Admitting: Psychology

## 2011-12-12 DIAGNOSIS — F331 Major depressive disorder, recurrent, moderate: Secondary | ICD-10-CM | POA: Diagnosis not present

## 2011-12-12 DIAGNOSIS — F4310 Post-traumatic stress disorder, unspecified: Secondary | ICD-10-CM

## 2011-12-18 DIAGNOSIS — L408 Other psoriasis: Secondary | ICD-10-CM | POA: Diagnosis not present

## 2011-12-19 ENCOUNTER — Ambulatory Visit (INDEPENDENT_AMBULATORY_CARE_PROVIDER_SITE_OTHER): Payer: Medicare Other | Admitting: Psychology

## 2011-12-19 ENCOUNTER — Other Ambulatory Visit: Payer: Self-pay | Admitting: Internal Medicine

## 2011-12-19 DIAGNOSIS — F4310 Post-traumatic stress disorder, unspecified: Secondary | ICD-10-CM

## 2011-12-19 DIAGNOSIS — F331 Major depressive disorder, recurrent, moderate: Secondary | ICD-10-CM

## 2011-12-20 ENCOUNTER — Ambulatory Visit (INDEPENDENT_AMBULATORY_CARE_PROVIDER_SITE_OTHER): Payer: Medicare Other | Admitting: Internal Medicine

## 2011-12-20 ENCOUNTER — Ambulatory Visit (INDEPENDENT_AMBULATORY_CARE_PROVIDER_SITE_OTHER)
Admission: RE | Admit: 2011-12-20 | Discharge: 2011-12-20 | Disposition: A | Discharge status: Home / Self Care | Payer: Medicare Other | Source: Ambulatory Visit | Attending: Internal Medicine | Admitting: Internal Medicine

## 2011-12-20 ENCOUNTER — Telehealth: Payer: Self-pay | Admitting: Internal Medicine

## 2011-12-20 ENCOUNTER — Encounter: Payer: Self-pay | Admitting: Internal Medicine

## 2011-12-20 VITALS — BP 160/90 | HR 68 | Temp 97.6°F | Ht 66.0 in | Wt 278.5 lb

## 2011-12-20 DIAGNOSIS — E1159 Type 2 diabetes mellitus with other circulatory complications: Secondary | ICD-10-CM | POA: Diagnosis not present

## 2011-12-20 DIAGNOSIS — F419 Anxiety disorder, unspecified: Secondary | ICD-10-CM

## 2011-12-20 DIAGNOSIS — I798 Other disorders of arteries, arterioles and capillaries in diseases classified elsewhere: Secondary | ICD-10-CM

## 2011-12-20 DIAGNOSIS — M549 Dorsalgia, unspecified: Secondary | ICD-10-CM

## 2011-12-20 DIAGNOSIS — I10 Essential (primary) hypertension: Secondary | ICD-10-CM

## 2011-12-20 DIAGNOSIS — F411 Generalized anxiety disorder: Secondary | ICD-10-CM | POA: Diagnosis not present

## 2011-12-20 DIAGNOSIS — M533 Sacrococcygeal disorders, not elsewhere classified: Secondary | ICD-10-CM | POA: Diagnosis not present

## 2011-12-20 MED ORDER — DIAZEPAM 2 MG PO TABS: 2.0000 mg | ORAL_TABLET | Freq: Three times a day (TID) | ORAL | Status: DC | PRN

## 2011-12-20 MED ORDER — DULOXETINE HCL 60 MG PO CPEP: 60.0000 mg | ORAL_CAPSULE | Freq: Every day | ORAL | Status: DC

## 2011-12-20 MED ORDER — DIAZEPAM 5 MG PO TABS: 5.0000 mg | ORAL_TABLET | Freq: Every evening | ORAL | Status: DC | PRN

## 2011-12-20 MED ORDER — OXYCODONE-ACETAMINOPHEN 5-325 MG PO TABS: 2.0000 | ORAL_TABLET | Freq: Three times a day (TID) | ORAL | Status: DC | PRN

## 2011-12-20 NOTE — Assessment & Plan Note (Signed)
Patient reports good control of blood sugars. Last hemoglobin A1c was improved at 7.9%. We'll plan to recheck A1c in July 2013. We'll continue current medications.

## 2011-12-20 NOTE — Telephone Encounter (Signed)
Pt calling to get results of xray results done  @ [facility] 12/20/11 Pt computer is not working please call do not put on my chart

## 2011-12-20 NOTE — Assessment & Plan Note (Addendum)
Blood pressure elevated today, likely because patient is in pain. Patient reports blood pressures have been better controlled at home. We'll plan to have him return to clinic in 1 week for recheck. He'll continue to monitor blood pressures at home and if blood pressure greater than 140/90 consistently, he will call.

## 2011-12-20 NOTE — Assessment & Plan Note (Signed)
Given a recent fall and tenderness over the coccyx, question if he may have coccygeal fracture. Will get plain x-ray for further evaluation. We'll continue to use Percocet as needed for severe pain. If x-ray is normal, will likely need referral to pain management for further care.

## 2011-12-20 NOTE — Progress Notes (Signed)
Subjective:    Patient ID: [name] [name], male    DOB: [birth year], 56 y.o.   MRN: [id number]  HPI 56 year old male with history of coronary artery disease, hypertension, hyperlipidemia, diabetes presents for acute visit complaining of severe lower back pain. He reports that 2 weeks ago he fell when walking in a parking lot and landed on his bottom on a cement curb. Since that time, he has had severe pain in his lower back. He has been using Percocet every 6 hours with minimal improvement. He denies weakness in his legs. He denies fever or chills. He does have chronic lower back pain which has recently been fairly well controlled.  In regards to his diabetes, he reports his blood sugars have been well-controlled with most blood sugars less than 200. He did not bring his glucometer today. He reports full compliance with his medications.  Outpatient Encounter Prescriptions as of 12/20/2011  Medication Sig Dispense Refill  . aspirin EC 81 MG tablet Take 81 mg by mouth daily.      [name] buPROPion (WELLBUTRIN XL) 150 MG 24 hr tablet       . cholecalciferol (VITAMIN D) 1000 UNITS tablet Take 1,000 Units by mouth daily.        . clopidogrel (PLAVIX) 75 MG tablet Take 75 mg by mouth daily.      . cyclobenzaprine (FLEXERIL) 10 MG tablet       . diazepam (VALIUM) 2 MG tablet Take 1 tablet (2 mg total) by mouth every 8 (eight) hours as needed for anxiety.  90 tablet  3  . diazepam (VALIUM) 5 MG tablet Take 1 tablet (5 mg total) by mouth at bedtime as needed for anxiety.  30 tablet  3  . DULoxetine (CYMBALTA) 60 MG capsule Take 60 mg by mouth at bedtime.      . DULoxetine (CYMBALTA) 60 MG capsule Take 1 capsule (60 mg total) by mouth daily.  30 capsule  6  . glucose blood test strip 1 each by Other route 3 (three) times daily as needed. Dx: 250.00      . insulin glargine (LANTUS) 100 UNIT/ML injection Inject 60 Units into the skin 2 (two) times daily.      . Lactulose 20 GM/30ML SOLN Take 30 mLs by mouth every 2  (two) hours as needed.      . Liraglutide (VICTOZA) 18 MG/3ML SOLN Inject 0.2 mLs (1.2 mg total) into the skin daily.  6 mL  3  . metoprolol succinate (TOPROL-XL) 50 MG 24 hr tablet Take 1 tablet (50 mg total) by mouth 2 (two) times daily.  60 tablet  6  . Multiple Vitamin (MULTIVITAMIN) capsule Take 1 capsule by mouth daily.        . nitroGLYCERIN (NITROSTAT) 0.4 MG SL tablet Place 1 tablet (0.4 mg total) under the tongue every 5 (five) minutes as needed for chest pain.  25 tablet  6  . NOVOFINE 32G X 6 MM MISC as directed.      [name] oxyCODONE-acetaminophen (PERCOCET) 5-325 MG per tablet Take 2 tablets by mouth every 8 (eight) hours as needed.  90 tablet  0  . simvastatin (ZOCOR) 20 MG tablet Take 1 tablet (20 mg total) by mouth at bedtime.  30 tablet  6  . traMADol (ULTRAM) 50 MG tablet Take 50-100 mg by mouth every 6 (six) hours as needed. TAKE 1-2 tablets as needed for pain.        Review of Systems  Constitutional: Negative for fever,  chills, activity change, appetite change, fatigue and unexpected weight change.  Eyes: Negative for visual disturbance.  Respiratory: Negative for cough and shortness of breath.   Cardiovascular: Negative for chest pain, palpitations and leg swelling.  Gastrointestinal: Negative for abdominal pain and abdominal distention.  Genitourinary: Negative for dysuria, urgency and difficulty urinating.  Musculoskeletal: Positive for myalgias, back pain and arthralgias. Negative for gait problem.  Skin: Negative for color change and rash.  Hematological: Negative for adenopathy.  Psychiatric/Behavioral: Negative for sleep disturbance and dysphoric mood. The patient is not nervous/anxious.    BP 160/90  Pulse 68  Temp(Src) 97.6 F (36.4 C) (Oral)  Ht 5\' 6"  (1.676 m)  Wt 278 lb 8 oz (126.327 kg)  BMI 44.95 kg/m2     Objective:   Physical Exam  Constitutional: He is oriented to person, place, and time. He appears well-developed and well-nourished. No distress.    HENT:  Head: Normocephalic and atraumatic.  Right Ear: External ear normal.  Left Ear: External ear normal.  Nose: Nose normal.  Mouth/Throat: Oropharynx is clear and moist. No oropharyngeal exudate.  Eyes: Conjunctivae and EOM are normal. Pupils are equal, round, and reactive to light. Right eye exhibits no discharge. Left eye exhibits no discharge. No scleral icterus.  Neck: Normal range of motion. Neck supple. No tracheal deviation present. No thyromegaly present.  Cardiovascular: Normal rate, regular rhythm and normal heart sounds.  Exam reveals no gallop and no friction rub.   No murmur heard. Pulmonary/Chest: Effort normal and breath sounds normal. No respiratory distress. He has no wheezes. He has no rales. He exhibits no tenderness.  Musculoskeletal: Normal range of motion. He exhibits no edema.       Lumbar back: He exhibits tenderness, pain and spasm. He exhibits no swelling, no edema and no deformity.  Lymphadenopathy:    He has no cervical adenopathy.  Neurological: He is alert and oriented to person, place, and time. No cranial nerve deficit. Coordination normal.  Skin: Skin is warm and dry. No rash noted. He is not diaphoretic. No erythema. No pallor.  Psychiatric: He has a normal mood and affect. His behavior is normal. Judgment and thought content normal.          Assessment & Plan:

## 2011-12-21 DIAGNOSIS — L408 Other psoriasis: Secondary | ICD-10-CM | POA: Diagnosis not present

## 2011-12-21 NOTE — Telephone Encounter (Signed)
Left detailed message notifying patient.

## 2011-12-21 NOTE — Telephone Encounter (Signed)
I had sent him a [employer]. The xray was normal. If pain persistent, then needs to be seen by pain management.

## 2011-12-26 ENCOUNTER — Ambulatory Visit (INDEPENDENT_AMBULATORY_CARE_PROVIDER_SITE_OTHER): Payer: Medicare Other | Admitting: Psychology

## 2011-12-26 DIAGNOSIS — F331 Major depressive disorder, recurrent, moderate: Secondary | ICD-10-CM | POA: Diagnosis not present

## 2011-12-26 DIAGNOSIS — F4310 Post-traumatic stress disorder, unspecified: Secondary | ICD-10-CM

## 2011-12-27 ENCOUNTER — Encounter: Payer: Self-pay | Admitting: Internal Medicine

## 2011-12-27 ENCOUNTER — Ambulatory Visit (INDEPENDENT_AMBULATORY_CARE_PROVIDER_SITE_OTHER): Payer: Medicare Other | Admitting: Internal Medicine

## 2011-12-27 VITALS — BP 140/90 | HR 94 | Temp 98.4°F | Ht 66.0 in | Wt 274.5 lb

## 2011-12-27 DIAGNOSIS — M545 Low back pain, unspecified: Secondary | ICD-10-CM | POA: Insufficient documentation

## 2011-12-27 DIAGNOSIS — E1159 Type 2 diabetes mellitus with other circulatory complications: Secondary | ICD-10-CM

## 2011-12-27 DIAGNOSIS — I798 Other disorders of arteries, arterioles and capillaries in diseases classified elsewhere: Secondary | ICD-10-CM | POA: Diagnosis not present

## 2011-12-27 DIAGNOSIS — I10 Essential (primary) hypertension: Secondary | ICD-10-CM

## 2011-12-27 MED ORDER — OXYCODONE HCL 10 MG PO TB12: 10.0000 mg | ORAL_TABLET | Freq: Two times a day (BID) | ORAL | Status: DC

## 2011-12-27 NOTE — Assessment & Plan Note (Signed)
Blood pressure elevated above goal today, however patient in severe pain secondary to ongoing back issues. Will continue to monitor blood pressure as pain is better controlled. Followup one month.

## 2011-12-27 NOTE — Patient Instructions (Signed)
Start oxycontin 10mg  twice daily.  Use Percocet as needed for severe pain.  We will set up MRI lumbar spine.

## 2011-12-27 NOTE — Assessment & Plan Note (Signed)
Patient with severe pain in his lumbar and sacral spine. Having weakness in his left leg as well. He reports abnormal MRI lumbar spine in the past with multiple bulging disc. Recent plain x-ray was normal. Will plan for repeat MRI lumbar spine. Will add OxyContin 10 mg twice daily for better pain control. He will use Percocet for breakthrough pain.

## 2011-12-27 NOTE — Progress Notes (Signed)
Subjective:    Patient ID: Ginnie Smart, male    DOB: 10-25-1955, 56 y.o.   MRN: [id number]  HPI 56 year old male with history of diabetes, hypertension, hyperlipidemia, coronary artery disease, and chronic lower back pain presents for followup. His lower back pain was recently worsened after a fall. Plain x-rays performed at last visit were normal. He notes that he was told in the past he had multiple bulging disc in his lumbar spine. Last MRI lumbar spine was several years ago. He is now having severe lower back pain which radiates down the back of his left leg. He occasionally has weakness in his left leg, and his leg gives out on him. He is currently using Percocet as needed for severe pain with minimal improvement.  Regards to his diabetes, he reports continued good control of his blood sugars. Last hemoglobin A1c was 7.9%.  In regards to his other chronic conditions including hypertension, he reports full compliance with his medications. He denies any chest pain, shortness of breath, or palpitations.  Outpatient Encounter Prescriptions as of 12/27/2011  Medication Sig Dispense Refill  . aspirin EC 81 MG tablet Take 81 mg by mouth daily.      [name] buPROPion (WELLBUTRIN XL) 150 MG 24 hr tablet       . cholecalciferol (VITAMIN D) 1000 UNITS tablet Take 1,000 Units by mouth daily.        . clopidogrel (PLAVIX) 75 MG tablet Take 75 mg by mouth daily.      . cyclobenzaprine (FLEXERIL) 10 MG tablet       . diazepam (VALIUM) 2 MG tablet Take 1 tablet (2 mg total) by mouth every 8 (eight) hours as needed for anxiety.  90 tablet  3  . diazepam (VALIUM) 5 MG tablet Take 1 tablet (5 mg total) by mouth at bedtime as needed for anxiety.  30 tablet  3  . DULoxetine (CYMBALTA) 60 MG capsule Take 60 mg by mouth at bedtime.      . DULoxetine (CYMBALTA) 60 MG capsule Take 1 capsule (60 mg total) by mouth daily.  30 capsule  6  . glucose blood test strip 1 each by Other route 3 (three) times daily as needed. Dx:  250.00      . insulin glargine (LANTUS) 100 UNIT/ML injection Inject 60 Units into the skin 2 (two) times daily.      . Lactulose 20 GM/30ML SOLN Take 30 mLs by mouth every 2 (two) hours as needed.      . Liraglutide (VICTOZA) 18 MG/3ML SOLN Inject 0.2 mLs (1.2 mg total) into the skin daily.  6 mL  3  . metoprolol succinate (TOPROL-XL) 50 MG 24 hr tablet Take 1 tablet (50 mg total) by mouth 2 (two) times daily.  60 tablet  6  . Multiple Vitamin (MULTIVITAMIN) capsule Take 1 capsule by mouth daily.        . nitroGLYCERIN (NITROSTAT) 0.4 MG SL tablet Place 1 tablet (0.4 mg total) under the tongue every 5 (five) minutes as needed for chest pain.  25 tablet  6  . NOVOFINE 32G X 6 MM MISC as directed.      [name] oxyCODONE-acetaminophen (PERCOCET) 5-325 MG per tablet Take 2 tablets by mouth every 8 (eight) hours as needed.  90 tablet  0  . simvastatin (ZOCOR) 20 MG tablet Take 1 tablet (20 mg total) by mouth at bedtime.  30 tablet  6  . traMADol (ULTRAM) 50 MG tablet Take 50-100 mg by mouth every  6 (six) hours as needed. TAKE 1-2 tablets as needed for pain.      [name] oxyCODONE (OXYCONTIN) 10 MG 12 hr tablet Take 1 tablet (10 mg total) by mouth every 12 (twelve) hours.  60 tablet  0    Review of Systems  Constitutional: Negative for fever, chills, activity change, appetite change, fatigue and unexpected weight change.  Eyes: Negative for visual disturbance.  Respiratory: Negative for cough and shortness of breath.   Cardiovascular: Negative for chest pain, palpitations and leg swelling.  Gastrointestinal: Negative for abdominal pain and abdominal distention.  Genitourinary: Negative for dysuria, urgency and difficulty urinating.  Musculoskeletal: Positive for myalgias, back pain, arthralgias and gait problem.  Skin: Negative for color change and rash.  Neurological: Positive for weakness.  Hematological: Negative for adenopathy.  Psychiatric/Behavioral: Negative for sleep disturbance and dysphoric mood.  The patient is not nervous/anxious.    BP 140/90  Pulse 94  Temp(Src) 98.4 F (36.9 C) (Oral)  Ht 5\' 6"  (1.676 m)  Wt 274 lb 8 oz (124.512 kg)  BMI 44.31 kg/m2  SpO2 98%     Objective:   Physical Exam  Constitutional: He is oriented to person, place, and time. He appears well-developed and well-nourished. No distress.  HENT:  Head: Normocephalic and atraumatic.  Right Ear: External ear normal.  Left Ear: External ear normal.  Nose: Nose normal.  Mouth/Throat: Oropharynx is clear and moist. No oropharyngeal exudate.  Eyes: Conjunctivae and EOM are normal. Pupils are equal, round, and reactive to light. Right eye exhibits no discharge. Left eye exhibits no discharge. No scleral icterus.  Neck: Normal range of motion. Neck supple. No tracheal deviation present. No thyromegaly present.  Cardiovascular: Normal rate, regular rhythm and normal heart sounds.  Exam reveals no gallop and no friction rub.   No murmur heard. Pulmonary/Chest: Effort normal and breath sounds normal. No respiratory distress. He has no wheezes. He has no rales. He exhibits no tenderness.  Musculoskeletal: He exhibits no edema.       Lumbar back: He exhibits decreased range of motion, tenderness and pain.  Lymphadenopathy:    He has no cervical adenopathy.  Neurological: He is alert and oriented to person, place, and time. No cranial nerve deficit. Coordination normal.  Skin: Skin is warm and dry. No rash noted. He is not diaphoretic. No erythema. No pallor.  Psychiatric: He has a normal mood and affect. His behavior is normal. Judgment and thought content normal.          Assessment & Plan:

## 2011-12-27 NOTE — Assessment & Plan Note (Signed)
Sugars recently good control. Last hemoglobin A1c 7.9%. We'll continue current medications. Patient will followup in one month with repeat A1c.

## 2012-01-08 ENCOUNTER — Telehealth: Payer: Self-pay | Admitting: Internal Medicine

## 2012-01-08 DIAGNOSIS — Z86010 Personal history of colonic polyps: Secondary | ICD-10-CM | POA: Diagnosis not present

## 2012-01-08 NOTE — Telephone Encounter (Signed)
We do have samples of Victoza, patient will come by this afternoon to pick them up.

## 2012-01-08 NOTE — Telephone Encounter (Signed)
Pt called to see if we have any samples of victoza

## 2012-01-09 ENCOUNTER — Ambulatory Visit (INDEPENDENT_AMBULATORY_CARE_PROVIDER_SITE_OTHER): Payer: Medicare Other | Admitting: Psychology

## 2012-01-09 DIAGNOSIS — F331 Major depressive disorder, recurrent, moderate: Secondary | ICD-10-CM | POA: Diagnosis not present

## 2012-01-09 DIAGNOSIS — F4310 Post-traumatic stress disorder, unspecified: Secondary | ICD-10-CM

## 2012-01-10 ENCOUNTER — Telehealth: Payer: Self-pay | Admitting: Internal Medicine

## 2012-01-10 DIAGNOSIS — I70219 Atherosclerosis of native arteries of extremities with intermittent claudication, unspecified extremity: Secondary | ICD-10-CM | POA: Diagnosis not present

## 2012-01-10 DIAGNOSIS — I10 Essential (primary) hypertension: Secondary | ICD-10-CM | POA: Diagnosis not present

## 2012-01-10 DIAGNOSIS — E669 Obesity, unspecified: Secondary | ICD-10-CM | POA: Diagnosis not present

## 2012-01-10 DIAGNOSIS — I6529 Occlusion and stenosis of unspecified carotid artery: Secondary | ICD-10-CM | POA: Diagnosis not present

## 2012-01-10 NOTE — Telephone Encounter (Signed)
tonya @ [facility] stated that they are waiting on more infor more in [place] about Mr [name] [name] The 5 stints that he had at [facility] are not safe they are still checking to see if they can scan him

## 2012-01-10 NOTE — Telephone Encounter (Signed)
Spoke with patient this morning and he stated that he has an appt with the doctor who placed his last stents today.  I advised patient that it is probably safer for him to see that doctor and let him decide if he should have the MRI done and then communicate his decision with [facility]

## 2012-01-10 NOTE — Telephone Encounter (Signed)
Mr [name] [name] and wanted you to know The mri has been cancel.  Because of the stints he has They wanted to know if you could get a ct scan instead Please advise pt [facility]

## 2012-01-16 ENCOUNTER — Ambulatory Visit: Payer: Medicare Other | Admitting: Psychology

## 2012-01-17 ENCOUNTER — Other Ambulatory Visit: Payer: Self-pay | Admitting: Internal Medicine

## 2012-01-17 DIAGNOSIS — M549 Dorsalgia, unspecified: Secondary | ICD-10-CM

## 2012-01-18 ENCOUNTER — Other Ambulatory Visit: Payer: Self-pay | Admitting: Internal Medicine

## 2012-01-18 ENCOUNTER — Ambulatory Visit: Payer: Self-pay | Admitting: Internal Medicine

## 2012-01-18 DIAGNOSIS — M545 Low back pain, unspecified: Secondary | ICD-10-CM | POA: Diagnosis not present

## 2012-01-18 DIAGNOSIS — N200 Calculus of kidney: Secondary | ICD-10-CM | POA: Diagnosis not present

## 2012-01-18 DIAGNOSIS — M5126 Other intervertebral disc displacement, lumbar region: Secondary | ICD-10-CM | POA: Diagnosis not present

## 2012-01-18 DIAGNOSIS — M549 Dorsalgia, unspecified: Secondary | ICD-10-CM

## 2012-01-20 ENCOUNTER — Telehealth: Payer: Self-pay | Admitting: Internal Medicine

## 2012-01-20 DIAGNOSIS — M549 Dorsalgia, unspecified: Secondary | ICD-10-CM

## 2012-01-20 DIAGNOSIS — R93429 Abnormal radiologic findings on diagnostic imaging of unspecified kidney: Secondary | ICD-10-CM

## 2012-01-20 NOTE — Telephone Encounter (Signed)
CT lumbar spine showed assimilation joints at L5-S1 and nonspecific lesions bilateral kidneys

## 2012-01-22 ENCOUNTER — Ambulatory Visit: Payer: Self-pay | Admitting: Internal Medicine

## 2012-01-22 ENCOUNTER — Telehealth: Payer: Self-pay | Admitting: Internal Medicine

## 2012-01-22 DIAGNOSIS — Q619 Cystic kidney disease, unspecified: Secondary | ICD-10-CM | POA: Diagnosis not present

## 2012-01-22 DIAGNOSIS — N289 Disorder of kidney and ureter, unspecified: Secondary | ICD-10-CM | POA: Diagnosis not present

## 2012-01-22 DIAGNOSIS — R935 Abnormal findings on diagnostic imaging of other abdominal regions, including retroperitoneum: Secondary | ICD-10-CM | POA: Diagnosis not present

## 2012-01-22 DIAGNOSIS — N281 Cyst of kidney, acquired: Secondary | ICD-10-CM | POA: Diagnosis not present

## 2012-01-22 DIAGNOSIS — N200 Calculus of kidney: Secondary | ICD-10-CM | POA: Diagnosis not present

## 2012-01-22 NOTE — Telephone Encounter (Signed)
US of the kidney showed multiple cysts bilaterally and a small stone in the left kidney.

## 2012-01-23 ENCOUNTER — Ambulatory Visit (INDEPENDENT_AMBULATORY_CARE_PROVIDER_SITE_OTHER): Payer: Medicare Other | Admitting: Psychology

## 2012-01-23 DIAGNOSIS — F331 Major depressive disorder, recurrent, moderate: Secondary | ICD-10-CM

## 2012-01-23 DIAGNOSIS — F4310 Post-traumatic stress disorder, unspecified: Secondary | ICD-10-CM

## 2012-01-28 ENCOUNTER — Ambulatory Visit (INDEPENDENT_AMBULATORY_CARE_PROVIDER_SITE_OTHER): Payer: Medicare Other | Admitting: Internal Medicine

## 2012-01-28 ENCOUNTER — Encounter: Payer: Self-pay | Admitting: Internal Medicine

## 2012-01-28 VITALS — BP 130/70 | HR 84 | Temp 98.7°F | Ht 66.0 in | Wt 270.2 lb

## 2012-01-28 DIAGNOSIS — E119 Type 2 diabetes mellitus without complications: Secondary | ICD-10-CM

## 2012-01-28 DIAGNOSIS — M549 Dorsalgia, unspecified: Secondary | ICD-10-CM

## 2012-01-28 DIAGNOSIS — M545 Low back pain, unspecified: Secondary | ICD-10-CM

## 2012-01-28 DIAGNOSIS — E1159 Type 2 diabetes mellitus with other circulatory complications: Secondary | ICD-10-CM

## 2012-01-28 DIAGNOSIS — I798 Other disorders of arteries, arterioles and capillaries in diseases classified elsewhere: Secondary | ICD-10-CM

## 2012-01-28 DIAGNOSIS — E118 Type 2 diabetes mellitus with unspecified complications: Secondary | ICD-10-CM | POA: Diagnosis not present

## 2012-01-28 LAB — HEMOGLOBIN A1C: Hgb A1c MFr Bld: 7.5 % — ABNORMAL HIGH (ref 4.6–6.5)

## 2012-01-28 LAB — COMPREHENSIVE METABOLIC PANEL
ALT: 26 U/L (ref 0–53)
AST: 20 U/L (ref 0–37)
Albumin: 3.9 g/dL (ref 3.5–5.2)
Alkaline Phosphatase: 110 U/L (ref 39–117)
BUN: 20 mg/dL (ref 6–23)
CO2: 29 mEq/L (ref 19–32)
Calcium: 9.2 mg/dL (ref 8.4–10.5)
Chloride: 103 mEq/L (ref 96–112)
Creatinine, Ser: 1.4 mg/dL (ref 0.4–1.5)
GFR: 54.36 mL/min — ABNORMAL LOW (ref >60.00)
Glucose, Bld: 95 mg/dL (ref 70–99)
Potassium: 4.7 mEq/L (ref 3.5–5.1)
Sodium: 140 mEq/L (ref 135–145)
Total Bilirubin: 0.6 mg/dL (ref 0.3–1.2)
Total Protein: 8.2 g/dL (ref 6.0–8.3)

## 2012-01-28 MED ORDER — INSULIN GLARGINE 100 UNIT/ML ~~LOC~~ SOLN: 60.0000 [IU] | Freq: Two times a day (BID) | SUBCUTANEOUS | Status: DC

## 2012-01-28 MED ORDER — OXYCODONE-ACETAMINOPHEN 5-325 MG PO TABS: 2.0000 | ORAL_TABLET | Freq: Three times a day (TID) | ORAL | Status: DC | PRN

## 2012-01-28 NOTE — Assessment & Plan Note (Signed)
Patient reports good control of blood sugars on current regimen. Will check A1c with labs today. Followup in one month.

## 2012-01-28 NOTE — Assessment & Plan Note (Signed)
Patient was unable to have MRI of the lumbar spine because of metallic stents. CT showed changes within the L5-S1 area consistent with joint assimulation. Patient has had evaluation by orthopedics and pain management in the past with minimal improvement. Will set up referral to neurosurgery to see if any intervention might be helpful for chronic pain. Until evaluation, will continue Percocet as needed for severe pain.

## 2012-01-28 NOTE — Progress Notes (Signed)
Subjective:    Patient ID: [name] [name], male    DOB: [birth year], 56 y.o.   MRN: [id number]  HPI 56 year old male presents for followup of chronic low back pain and diabetes. In regards to diabetes, he reports blood sugars have been well-controlled. He did not bring a record of blood sugars today. He reports full compliance with his medications.  In regards to chronic low back pain, he continues to have episodes of severe pain in his lower back which limit his ability to perform ADLs. He has been evaluated by pain management in the past has had injection with minimal improvement. He recently had CT of the lumbar spine which showed some changes within the L5-S1 interspace. He was unable to have MRI because of cardiac stents. He currently uses Percocet as needed for severe pain. He reports minimal improvement with this medication. We had tried using OxyContin, but he was uncomfortable using this medication because of addictive potential. He had no improvement with use of Flexeril, tramadol, or Lidoderm patch.  Outpatient Encounter Prescriptions as of 01/28/2012  Medication Sig Dispense Refill  . aspirin EC 81 MG tablet Take 81 mg by mouth daily.      [name] buPROPion (WELLBUTRIN XL) 150 MG 24 hr tablet       . cholecalciferol (VITAMIN D) 1000 UNITS tablet Take 1,000 Units by mouth daily.        . clopidogrel (PLAVIX) 75 MG tablet Take 75 mg by mouth daily.      . cyclobenzaprine (FLEXERIL) 10 MG tablet       . diazepam (VALIUM) 2 MG tablet Take 1 tablet (2 mg total) by mouth every 8 (eight) hours as needed for anxiety.  90 tablet  3  . diazepam (VALIUM) 5 MG tablet Take 1 tablet (5 mg total) by mouth at bedtime as needed for anxiety.  30 tablet  3  . DULoxetine (CYMBALTA) 60 MG capsule Take 60 mg by mouth at bedtime.      . DULoxetine (CYMBALTA) 60 MG capsule Take 1 capsule (60 mg total) by mouth daily.  30 capsule  6  . glucose blood test strip 1 each by Other route 3 (three) times daily as needed. Dx:  250.00      . insulin glargine (LANTUS SOLOSTAR) 100 UNIT/ML injection Inject 60 Units into the skin 2 (two) times daily.  15 mL  12  . Lactulose 20 GM/30ML SOLN Take 30 mLs by mouth every 2 (two) hours as needed.      . Liraglutide (VICTOZA) 18 MG/3ML SOLN Inject 0.2 mLs (1.2 mg total) into the skin daily.  6 mL  3  . metoprolol succinate (TOPROL-XL) 50 MG 24 hr tablet Take 1 tablet (50 mg total) by mouth 2 (two) times daily.  60 tablet  6  . Multiple Vitamin (MULTIVITAMIN) capsule Take 1 capsule by mouth daily.        . nitroGLYCERIN (NITROSTAT) 0.4 MG SL tablet Place 0.4 mg under the tongue every 5 (five) minutes as needed.      [name] NOVOFINE 32G X 6 MM MISC as directed.      [name] oxyCODONE (OXYCONTIN) 10 MG 12 hr tablet Take 1 tablet (10 mg total) by mouth every 12 (twelve) hours.  60 tablet  0  . oxyCODONE-acetaminophen (PERCOCET) 5-325 MG per tablet Take 2 tablets by mouth every 8 (eight) hours as needed.  90 tablet  0  . simvastatin (ZOCOR) 20 MG tablet Take 1 tablet (20 mg total) by mouth  at bedtime.  30 tablet  6  . traMADol (ULTRAM) 50 MG tablet Take 50-100 mg by mouth every 6 (six) hours as needed. TAKE 1-2 tablets as needed for pain.        Review of Systems  Constitutional: Negative for fever, chills, activity change, appetite change, fatigue and unexpected weight change.  Eyes: Negative for visual disturbance.  Respiratory: Negative for cough and shortness of breath.   Cardiovascular: Negative for chest pain, palpitations and leg swelling.  Gastrointestinal: Negative for abdominal pain and abdominal distention.  Genitourinary: Negative for dysuria, urgency and difficulty urinating.  Musculoskeletal: Positive for myalgias, back pain and arthralgias. Negative for gait problem.  Skin: Negative for color change and rash.  Hematological: Negative for adenopathy.  Psychiatric/Behavioral: Negative for disturbed wake/sleep cycle and dysphoric mood. The patient is not nervous/anxious.    BP  130/70  Pulse 84  Temp 98.7 F (37.1 C) (Oral)  Ht 5\' 6"  (1.676 m)  Wt 270 lb 4 oz (122.585 kg)  BMI 43.62 kg/m2  SpO2 97%     Objective:   Physical Exam  Constitutional: He is oriented to person, place, and time. He appears well-developed and well-nourished. No distress.  HENT:  Head: Normocephalic and atraumatic.  Right Ear: External ear normal.  Left Ear: External ear normal.  Nose: Nose normal.  Mouth/Throat: Oropharynx is clear and moist. No oropharyngeal exudate.  Eyes: Conjunctivae and EOM are normal. Pupils are equal, round, and reactive to light. Right eye exhibits no discharge. Left eye exhibits no discharge. No scleral icterus.  Neck: Normal range of motion. Neck supple. No tracheal deviation present. No thyromegaly present.  Cardiovascular: Normal rate, regular rhythm and normal heart sounds.  Exam reveals no gallop and no friction rub.   No murmur heard. Pulmonary/Chest: Effort normal and breath sounds normal. No respiratory distress. He has no wheezes. He has no rales. He exhibits no tenderness.  Musculoskeletal: He exhibits no edema.       Lumbar back: He exhibits decreased range of motion, tenderness, pain and spasm.  Lymphadenopathy:    He has no cervical adenopathy.  Neurological: He is alert and oriented to person, place, and time. No cranial nerve deficit. Coordination normal.  Skin: Skin is warm and dry. No rash noted. He is not diaphoretic. No erythema. No pallor.  Psychiatric: He has a normal mood and affect. His behavior is normal. Judgment and thought content normal.          Assessment & Plan:

## 2012-01-29 ENCOUNTER — Encounter: Payer: Self-pay | Admitting: Internal Medicine

## 2012-01-30 ENCOUNTER — Ambulatory Visit (INDEPENDENT_AMBULATORY_CARE_PROVIDER_SITE_OTHER): Payer: Medicare Other | Admitting: Psychology

## 2012-01-30 DIAGNOSIS — F331 Major depressive disorder, recurrent, moderate: Secondary | ICD-10-CM | POA: Diagnosis not present

## 2012-01-30 DIAGNOSIS — F4310 Post-traumatic stress disorder, unspecified: Secondary | ICD-10-CM | POA: Diagnosis not present

## 2012-02-01 DIAGNOSIS — B351 Tinea unguium: Secondary | ICD-10-CM | POA: Diagnosis not present

## 2012-02-01 DIAGNOSIS — M79609 Pain in unspecified limb: Secondary | ICD-10-CM | POA: Diagnosis not present

## 2012-02-06 ENCOUNTER — Ambulatory Visit: Payer: Medicare Other | Admitting: Psychology

## 2012-02-07 DIAGNOSIS — D126 Benign neoplasm of colon, unspecified: Secondary | ICD-10-CM | POA: Diagnosis not present

## 2012-02-07 DIAGNOSIS — Z86010 Personal history of colonic polyps: Secondary | ICD-10-CM | POA: Diagnosis not present

## 2012-02-11 ENCOUNTER — Encounter: Payer: Self-pay | Admitting: Cardiovascular Disease

## 2012-02-11 ENCOUNTER — Ambulatory Visit (INDEPENDENT_AMBULATORY_CARE_PROVIDER_SITE_OTHER): Payer: Medicare Other | Admitting: Cardiovascular Disease

## 2012-02-11 VITALS — BP 142/80 | HR 92 | Ht 66.0 in | Wt 275.8 lb

## 2012-02-11 DIAGNOSIS — E785 Hyperlipidemia, unspecified: Secondary | ICD-10-CM

## 2012-02-11 DIAGNOSIS — R0789 Other chest pain: Secondary | ICD-10-CM | POA: Diagnosis not present

## 2012-02-11 DIAGNOSIS — I359 Nonrheumatic aortic valve disorder, unspecified: Secondary | ICD-10-CM

## 2012-02-11 DIAGNOSIS — I10 Essential (primary) hypertension: Secondary | ICD-10-CM

## 2012-02-11 DIAGNOSIS — I2510 Atherosclerotic heart disease of native coronary artery without angina pectoris: Secondary | ICD-10-CM

## 2012-02-11 DIAGNOSIS — I350 Nonrheumatic aortic (valve) stenosis: Secondary | ICD-10-CM | POA: Insufficient documentation

## 2012-02-11 NOTE — Assessment & Plan Note (Signed)
Continue treatment with simvastatin. 

## 2012-02-11 NOTE — Assessment & Plan Note (Signed)
Patient has known history of aortic stenosis with no recent evaluation. Due to his symptoms, I recommend a followup echocardiogram.

## 2012-02-11 NOTE — Patient Instructions (Addendum)
Your physician has requested that you have an echocardiogram. Echocardiography is a painless test that uses sound waves to create images of your heart. It provides your doctor with information about the size and shape of your heart and how well your heart's chambers and valves are working. This procedure takes approximately one hour. There are no restrictions for this procedure.  Your physician has requested that you have a lexiscan myoview. For further information please visit [email or web address]. Please follow instruction sheet, as given.  Follow up in 6 months.

## 2012-02-11 NOTE — Assessment & Plan Note (Signed)
His blood pressure is slightly elevated. If his cardiac evaluation reveals decreased LV systolic function, he would benefit from an ACE inhibitor or ARB.

## 2012-02-11 NOTE — Assessment & Plan Note (Signed)
The patient has known history of coronary artery disease status post CABG. He had recent left arm and chest discomfort without ECG changes. He has noticed increased exertional dyspnea as well. He hasn't had any ischemic evaluation since his CABG. Thus, I recommend a pharmacologic nuclear stress test for evaluation. Continue aggressive medical therapy.

## 2012-02-11 NOTE — Progress Notes (Signed)
HPI  This is a 56 year old male who is here today for a followup visit. He has known history of coronary artery disease status post drug-eluting stent placements to the LAD in 2011. Repeat cardiac catheterization in 2012 showed significant restenosis with progression of three-vessel disease. He underwent coronary artery bypass graft surgery at that time. He also had carotid endarterectomy. He has multiple medical conditions that include type 2 diabetes, hypertension, hyperlipidemia and peripheral arterial disease. The last few days, he had some discomfort and numbness in the left arm. Yesterday had an episode of chest tightness which was overall mild but lasted for a few hours. He did not have to take nitroglycerin for it.  Allergies  Allergen Reactions  . Contrast Media (Iodinated Diagnostic Agents)     Got very hot and red  . Glipizide Other (See Comments)    ANTIDIABETICS. Burning  . Penicillins Hives and Swelling     Current Outpatient Prescriptions on File Prior to Visit  Medication Sig Dispense Refill  . buPROPion (WELLBUTRIN XL) 150 MG 24 hr tablet Take 150 mg by mouth daily.       . cholecalciferol (VITAMIN D) 1000 UNITS tablet Take 1,000 Units by mouth daily.        . clopidogrel (PLAVIX) 75 MG tablet Take 75 mg by mouth daily.      . cyclobenzaprine (FLEXERIL) 10 MG tablet Take 10 mg by mouth 2 (two) times daily as needed.       . diazepam (VALIUM) 2 MG tablet Take 1 tablet (2 mg total) by mouth every 8 (eight) hours as needed for anxiety.  90 tablet  3  . diazepam (VALIUM) 5 MG tablet Take 1 tablet (5 mg total) by mouth at bedtime as needed for anxiety.  30 tablet  3  . DULoxetine (CYMBALTA) 60 MG capsule Take 1 capsule (60 mg total) by mouth daily.  30 capsule  6  . glucose blood test strip 1 each by Other route 3 (three) times daily as needed. Dx: 250.00      . insulin glargine (LANTUS SOLOSTAR) 100 UNIT/ML injection Inject 60 Units into the skin 2 (two) times daily.  15 mL   12  . Lactulose 20 GM/30ML SOLN Take 30 mLs by mouth every 2 (two) hours as needed.      . Liraglutide (VICTOZA) 18 MG/3ML SOLN Inject 0.2 mLs (1.2 mg total) into the skin daily.  6 mL  3  . metoprolol succinate (TOPROL-XL) 50 MG 24 hr tablet Take 1 tablet (50 mg total) by mouth 2 (two) times daily.  60 tablet  6  . Multiple Vitamin (MULTIVITAMIN) capsule Take 1 capsule by mouth daily.        . nitroGLYCERIN (NITROSTAT) 0.4 MG SL tablet Place 0.4 mg under the tongue every 5 (five) minutes as needed.      [name] NOVOFINE 32G X 6 MM MISC as directed.      [name] oxyCODONE (OXYCONTIN) 10 MG 12 hr tablet Take 1 tablet (10 mg total) by mouth every 12 (twelve) hours.  60 tablet  0  . oxyCODONE-acetaminophen (PERCOCET) 5-325 MG per tablet Take 2 tablets by mouth every 8 (eight) hours as needed.  90 tablet  0  . simvastatin (ZOCOR) 20 MG tablet Take 1 tablet (20 mg total) by mouth at bedtime.  30 tablet  6  . DISCONTD: DULoxetine (CYMBALTA) 60 MG capsule Take 60 mg by mouth at bedtime.         Past  Medical History  Diagnosis Date  . Peripheral vascular disease   . Diabetes mellitus   . Mild aortic stenosis   . Tobacco use disorder     recently quit  . Depression   . Psoriasis   . Neuropathy   . Obesity   . Post splenectomy syndrome   . SOB (shortness of breath)   . Carotid artery occlusion   . Coronary artery disease   . Hyperlipidemia   . Hypertension   . Bell's palsy 2007  . Sleep apnea   . Angina at rest     chronic  . Abnormal colonoscopy 06/2011    Dr. Minna Merritts, polyp     Past Surgical History  Procedure Date  . Splenectomy   . Heart stents Jan 2011    leg stents 06/2009 and 03/2010  . Coronary artery bypass graft 09/06/2010    At [facility]: LIMA to LAD, left radial to RCA, sequential SVG to OM3 and 4  . Cardiac catheterization 08/2010    LAD: 80% ISR, RCA: 80% ostial, OM 80-90%  . Coronary angioplasty     LAD: before CABG  . Carotid endarterectomy 08/2010    left/ Dr. Kellie Simmering  . Pta  of illiac and sfa multiple    Dr. Ronalee Belts     Family History  Problem Relation Age of Onset  . Cancer Father 61  . Cancer Sister 43     History   Social History  . Marital Status: Married    Spouse Name: N/A    Number of Children: N/A  . Years of Education: N/A   Occupational History  . Not on file.   Social History Main Topics  . Smoking status: Current Some Day Smoker -- 0.3 packs/day for 40 years    Types: Cigarettes  . Smokeless tobacco: Never Used  . Alcohol Use: No  . Drug Use: No  . Sexually Active: Not on file   Other Topics Concern  . Not on file   Social History Narrative  . No narrative on file     PHYSICAL EXAM   BP 142/80  Pulse 92  Ht 5\' 6"  (1.676 m)  Wt 275 lb 12 oz (125.079 kg)  BMI 44.51 kg/m2 Constitutional: He is oriented to person, place, and time. He appears well-developed and well-nourished. No distress.  HENT: No nasal discharge.  Head: Normocephalic and atraumatic.  Eyes: Pupils are equal and round. Right eye exhibits no discharge. Left eye exhibits no discharge.  Neck: Normal range of motion. Neck supple. No JVD present. No thyromegaly present.  Cardiovascular: Normal rate, regular rhythm, diminished S2. Exam reveals no gallop and no friction rub. There is a 2/6 systolic ejection murmur at the aortic area. Pulmonary/Chest: Effort normal and breath sounds normal. No stridor. No respiratory distress. He has no wheezes. He has no rales. He exhibits no tenderness.  Abdominal: Soft. Bowel sounds are normal. He exhibits no distension. There is no tenderness. There is no rebound and no guarding.  Musculoskeletal: Normal range of motion. He exhibits trace edema and no tenderness.  Neurological: He is alert and oriented to person, place, and time. Coordination normal.  Skin: Skin is warm and dry. There is stasis dermatitis in the lower extremities. He is not diaphoretic. No erythema. No pallor.  Psychiatric: He has a normal mood and affect.  His behavior is normal. Judgment and thought content normal.       EKG: Normal sinus rhythm with poor R-wave progression in the precordial leads. No  significant ST or T wave changes.   ASSESSMENT AND PLAN

## 2012-02-12 ENCOUNTER — Telehealth: Payer: Self-pay | Admitting: Internal Medicine

## 2012-02-12 NOTE — Telephone Encounter (Signed)
Patient wants samples of Victoza he is out and about today if you have any he can stop by. Call him on his cell phone.

## 2012-02-12 NOTE — Telephone Encounter (Signed)
Patient advised via telephone, we do have samples of Victoza for him to pick up.

## 2012-02-13 ENCOUNTER — Ambulatory Visit (INDEPENDENT_AMBULATORY_CARE_PROVIDER_SITE_OTHER): Payer: Medicare Other | Admitting: Psychology

## 2012-02-13 ENCOUNTER — Other Ambulatory Visit (INDEPENDENT_AMBULATORY_CARE_PROVIDER_SITE_OTHER): Payer: Medicare Other

## 2012-02-13 ENCOUNTER — Other Ambulatory Visit: Payer: Self-pay

## 2012-02-13 DIAGNOSIS — F4310 Post-traumatic stress disorder, unspecified: Secondary | ICD-10-CM | POA: Diagnosis not present

## 2012-02-13 DIAGNOSIS — I350 Nonrheumatic aortic (valve) stenosis: Secondary | ICD-10-CM

## 2012-02-13 DIAGNOSIS — I359 Nonrheumatic aortic valve disorder, unspecified: Secondary | ICD-10-CM | POA: Diagnosis not present

## 2012-02-13 DIAGNOSIS — F331 Major depressive disorder, recurrent, moderate: Secondary | ICD-10-CM | POA: Diagnosis not present

## 2012-02-14 ENCOUNTER — Ambulatory Visit: Payer: Self-pay | Admitting: Cardiovascular Disease

## 2012-02-14 DIAGNOSIS — R0789 Other chest pain: Secondary | ICD-10-CM | POA: Diagnosis not present

## 2012-02-14 DIAGNOSIS — R079 Chest pain, unspecified: Secondary | ICD-10-CM | POA: Diagnosis not present

## 2012-02-19 ENCOUNTER — Other Ambulatory Visit: Payer: Self-pay | Admitting: Cardiovascular Disease

## 2012-02-19 DIAGNOSIS — M545 Low back pain, unspecified: Secondary | ICD-10-CM | POA: Diagnosis not present

## 2012-02-19 DIAGNOSIS — R0789 Other chest pain: Secondary | ICD-10-CM

## 2012-02-25 ENCOUNTER — Ambulatory Visit (INDEPENDENT_AMBULATORY_CARE_PROVIDER_SITE_OTHER): Payer: Medicare Other | Admitting: Internal Medicine

## 2012-02-25 ENCOUNTER — Encounter: Payer: Self-pay | Admitting: Internal Medicine

## 2012-02-25 VITALS — BP 140/90 | HR 78 | Temp 98.3°F | Ht 66.0 in | Wt 272.8 lb

## 2012-02-25 DIAGNOSIS — M549 Dorsalgia, unspecified: Secondary | ICD-10-CM

## 2012-02-25 DIAGNOSIS — E119 Type 2 diabetes mellitus without complications: Secondary | ICD-10-CM

## 2012-02-25 DIAGNOSIS — I798 Other disorders of arteries, arterioles and capillaries in diseases classified elsewhere: Secondary | ICD-10-CM

## 2012-02-25 DIAGNOSIS — E1159 Type 2 diabetes mellitus with other circulatory complications: Secondary | ICD-10-CM

## 2012-02-25 DIAGNOSIS — M545 Low back pain, unspecified: Secondary | ICD-10-CM | POA: Diagnosis not present

## 2012-02-25 DIAGNOSIS — I10 Essential (primary) hypertension: Secondary | ICD-10-CM

## 2012-02-25 DIAGNOSIS — E785 Hyperlipidemia, unspecified: Secondary | ICD-10-CM

## 2012-02-25 MED ORDER — OXYCODONE-ACETAMINOPHEN 5-325 MG PO TABS: 2.0000 | ORAL_TABLET | Freq: Three times a day (TID) | ORAL | Status: DC | PRN

## 2012-02-25 MED ORDER — OXYCODONE HCL 10 MG PO TB12: 10.0000 mg | ORAL_TABLET | Freq: Two times a day (BID) | ORAL | Status: DC

## 2012-02-25 NOTE — Assessment & Plan Note (Signed)
Blood pressure slightly elevated. For now, we'll continue to monitor. At return visit if persistent elevation, would favor adding low-dose lisinopril. Patient has some chronic renal insufficiency so we'll have to monitor closely. Followup 2 months.

## 2012-02-25 NOTE — Progress Notes (Signed)
Subjective:    Patient ID: [name] [name], male    DOB: [birth year], 56 y.o.   MRN: [id number]  HPI 56 year old male with history of coronary artery disease, diabetes, hypertension, hyperlipidemia presents for followup. He was recently evaluated for lower back pain. CT of the lumbar spine showed assimilation changes. He was then sent to neurosurgery for evaluation. He reports that he was told that neurosurgical intervention would be too risky with little hope of improvement in his symptoms. He reports he is currently taking OxyContin and oxycodone as needed for severe pain only. He he uses the medication sparingly in an effort to avoid oversedation. He reports using pain medications for days in which she is going to be very active, such as mowing his yard. Otherwise, he tries to avoid the use of medication.  In regards to his diabetes, he reports blood sugars have been well-controlled. He reports full compliance with his medications. He notes he is actively trying to lose weight by limiting overall caloric intake.  Outpatient Encounter Prescriptions as of 02/25/2012  Medication Sig Dispense Refill  . buPROPion (WELLBUTRIN XL) 150 MG 24 hr tablet Take 150 mg by mouth daily.       . cholecalciferol (VITAMIN D) 1000 UNITS tablet Take 1,000 Units by mouth daily.        . clopidogrel (PLAVIX) 75 MG tablet Take 75 mg by mouth daily.      . cyclobenzaprine (FLEXERIL) 10 MG tablet Take 10 mg by mouth 2 (two) times daily as needed.       . diazepam (VALIUM) 2 MG tablet Take 1 tablet (2 mg total) by mouth every 8 (eight) hours as needed for anxiety.  90 tablet  3  . diazepam (VALIUM) 5 MG tablet Take 1 tablet (5 mg total) by mouth at bedtime as needed for anxiety.  30 tablet  3  . DULoxetine (CYMBALTA) 60 MG capsule Take 1 capsule (60 mg total) by mouth daily.  30 capsule  6  . glucose blood test strip 1 each by Other route 3 (three) times daily as needed. Dx: 250.00      . insulin glargine (LANTUS SOLOSTAR)  100 UNIT/ML injection Inject 60 Units into the skin 2 (two) times daily.  15 mL  12  . Lactulose 20 GM/30ML SOLN Take 30 mLs by mouth every 2 (two) hours as needed.      . Liraglutide (VICTOZA) 18 MG/3ML SOLN Inject 0.2 mLs (1.2 mg total) into the skin daily.  6 mL  3  . metoprolol succinate (TOPROL-XL) 50 MG 24 hr tablet Take 1 tablet (50 mg total) by mouth 2 (two) times daily.  60 tablet  6  . Multiple Vitamin (MULTIVITAMIN) capsule Take 1 capsule by mouth daily.        . nitroGLYCERIN (NITROSTAT) 0.4 MG SL tablet Place 0.4 mg under the tongue every 5 (five) minutes as needed.      [name] NOVOFINE 32G X 6 MM MISC as directed.      [name] oxyCODONE (OXYCONTIN) 10 MG 12 hr tablet Take 1 tablet (10 mg total) by mouth every 12 (twelve) hours.  60 tablet  0  . oxyCODONE-acetaminophen (PERCOCET/ROXICET) 5-325 MG per tablet Take 2 tablets by mouth every 8 (eight) hours as needed.  90 tablet  0  . simvastatin (ZOCOR) 20 MG tablet Take 1 tablet (20 mg total) by mouth at bedtime.  30 tablet  6  . DISCONTD: oxyCODONE (OXYCONTIN) 10 MG 12 hr tablet Take 1 tablet (10  mg total) by mouth every 12 (twelve) hours.  60 tablet  0  . DISCONTD: oxyCODONE-acetaminophen (PERCOCET) 5-325 MG per tablet Take 2 tablets by mouth every 8 (eight) hours as needed.  90 tablet  0   BP 140/90  Pulse 78  Temp 98.3 F (36.8 C) (Oral)  Ht 5\' 6"  (1.676 m)  Wt 272 lb 12 oz (123.719 kg)  BMI 44.02 kg/m2  SpO2 97%  Review of Systems  Constitutional: Negative for fever, chills, activity change, appetite change, fatigue and unexpected weight change.  Eyes: Negative for visual disturbance.  Respiratory: Negative for cough and shortness of breath.   Cardiovascular: Negative for chest pain, palpitations and leg swelling.  Gastrointestinal: Negative for abdominal pain and abdominal distention.  Genitourinary: Negative for dysuria, urgency and difficulty urinating.  Musculoskeletal: Positive for myalgias and back pain. Negative for  arthralgias and gait problem.  Skin: Negative for color change and rash.  Hematological: Negative for adenopathy.  Psychiatric/Behavioral: Negative for disturbed wake/sleep cycle and dysphoric mood. The patient is not nervous/anxious.        Objective:   Physical Exam  Constitutional: He is oriented to person, place, and time. He appears well-developed and well-nourished. No distress.  HENT:  Head: Normocephalic and atraumatic.  Right Ear: External ear normal.  Left Ear: External ear normal.  Nose: Nose normal.  Mouth/Throat: Oropharynx is clear and moist. No oropharyngeal exudate.  Eyes: Conjunctivae and EOM are normal. Pupils are equal, round, and reactive to light. Right eye exhibits no discharge. Left eye exhibits no discharge. No scleral icterus.  Neck: Normal range of motion. Neck supple. No tracheal deviation present. No thyromegaly present.  Cardiovascular: Normal rate and regular rhythm.  Exam reveals no gallop and no friction rub.   Murmur heard. Pulmonary/Chest: Effort normal and breath sounds normal. No respiratory distress. He has no wheezes. He has no rales. He exhibits no tenderness.  Musculoskeletal: He exhibits no edema.       Lumbar back: He exhibits decreased range of motion and pain.  Lymphadenopathy:    He has no cervical adenopathy.  Neurological: He is alert and oriented to person, place, and time. No cranial nerve deficit. Coordination normal.  Skin: Skin is warm and dry. No rash noted. He is not diaphoretic. No erythema. No pallor.  Psychiatric: He has a normal mood and affect. His behavior is normal. Judgment and thought content normal.          Assessment & Plan:

## 2012-02-25 NOTE — Assessment & Plan Note (Signed)
Symptoms well controlled on current medications. Neurosurgery evaluation completed and recommended no surgical intervention at this time. Patient has completed physical therapy. Will continue with current pain medication regimen.

## 2012-02-25 NOTE — Assessment & Plan Note (Signed)
Patient reports good control of blood sugars. Last A1c was 7.5%. We'll continue current medications. Repeat A1c in October 2013.

## 2012-02-25 NOTE — Assessment & Plan Note (Signed)
LDL at goal and simvastatin. We'll continue. Follow up 2 months.

## 2012-02-27 ENCOUNTER — Ambulatory Visit (INDEPENDENT_AMBULATORY_CARE_PROVIDER_SITE_OTHER): Payer: Medicare Other | Admitting: Psychology

## 2012-02-27 DIAGNOSIS — F4310 Post-traumatic stress disorder, unspecified: Secondary | ICD-10-CM | POA: Diagnosis not present

## 2012-02-27 DIAGNOSIS — F331 Major depressive disorder, recurrent, moderate: Secondary | ICD-10-CM

## 2012-03-05 ENCOUNTER — Ambulatory Visit (INDEPENDENT_AMBULATORY_CARE_PROVIDER_SITE_OTHER): Payer: Medicare Other | Admitting: Psychology

## 2012-03-05 DIAGNOSIS — F331 Major depressive disorder, recurrent, moderate: Secondary | ICD-10-CM

## 2012-03-05 DIAGNOSIS — F4310 Post-traumatic stress disorder, unspecified: Secondary | ICD-10-CM

## 2012-03-12 ENCOUNTER — Ambulatory Visit: Payer: Medicare Other | Admitting: Psychology

## 2012-03-13 ENCOUNTER — Telehealth: Payer: Self-pay | Admitting: Internal Medicine

## 2012-03-13 NOTE — Telephone Encounter (Signed)
Pt called wanting to get samples victozia

## 2012-03-14 NOTE — Telephone Encounter (Signed)
Patient advised we have no samples of Victozia at this time.

## 2012-03-19 ENCOUNTER — Ambulatory Visit (INDEPENDENT_AMBULATORY_CARE_PROVIDER_SITE_OTHER): Payer: Medicare Other | Admitting: Psychology

## 2012-03-19 DIAGNOSIS — F331 Major depressive disorder, recurrent, moderate: Secondary | ICD-10-CM | POA: Diagnosis not present

## 2012-03-19 DIAGNOSIS — F4310 Post-traumatic stress disorder, unspecified: Secondary | ICD-10-CM

## 2012-03-26 ENCOUNTER — Ambulatory Visit (INDEPENDENT_AMBULATORY_CARE_PROVIDER_SITE_OTHER): Payer: Medicare Other | Admitting: Psychology

## 2012-03-26 DIAGNOSIS — F4310 Post-traumatic stress disorder, unspecified: Secondary | ICD-10-CM

## 2012-03-26 DIAGNOSIS — F331 Major depressive disorder, recurrent, moderate: Secondary | ICD-10-CM

## 2012-04-02 ENCOUNTER — Ambulatory Visit (INDEPENDENT_AMBULATORY_CARE_PROVIDER_SITE_OTHER): Payer: Medicare Other | Admitting: Psychology

## 2012-04-02 DIAGNOSIS — F331 Major depressive disorder, recurrent, moderate: Secondary | ICD-10-CM

## 2012-04-02 DIAGNOSIS — F4310 Post-traumatic stress disorder, unspecified: Secondary | ICD-10-CM | POA: Diagnosis not present

## 2012-04-16 ENCOUNTER — Ambulatory Visit (INDEPENDENT_AMBULATORY_CARE_PROVIDER_SITE_OTHER): Payer: Medicare Other | Admitting: Psychology

## 2012-04-16 DIAGNOSIS — F4310 Post-traumatic stress disorder, unspecified: Secondary | ICD-10-CM

## 2012-04-16 DIAGNOSIS — F331 Major depressive disorder, recurrent, moderate: Secondary | ICD-10-CM | POA: Diagnosis not present

## 2012-04-17 ENCOUNTER — Other Ambulatory Visit: Payer: Self-pay | Admitting: Internal Medicine

## 2012-04-21 ENCOUNTER — Other Ambulatory Visit (INDEPENDENT_AMBULATORY_CARE_PROVIDER_SITE_OTHER): Payer: Medicare Other | Admitting: *Deleted

## 2012-04-21 DIAGNOSIS — I6529 Occlusion and stenosis of unspecified carotid artery: Secondary | ICD-10-CM

## 2012-04-23 ENCOUNTER — Ambulatory Visit (INDEPENDENT_AMBULATORY_CARE_PROVIDER_SITE_OTHER): Payer: Medicare Other | Admitting: Psychology

## 2012-04-23 DIAGNOSIS — F331 Major depressive disorder, recurrent, moderate: Secondary | ICD-10-CM

## 2012-04-23 DIAGNOSIS — F4310 Post-traumatic stress disorder, unspecified: Secondary | ICD-10-CM

## 2012-04-25 ENCOUNTER — Other Ambulatory Visit (INDEPENDENT_AMBULATORY_CARE_PROVIDER_SITE_OTHER): Payer: Medicare Other

## 2012-04-25 ENCOUNTER — Telehealth: Payer: Self-pay | Admitting: *Deleted

## 2012-04-25 ENCOUNTER — Other Ambulatory Visit: Payer: Self-pay | Admitting: *Deleted

## 2012-04-25 DIAGNOSIS — I10 Essential (primary) hypertension: Secondary | ICD-10-CM

## 2012-04-25 DIAGNOSIS — I798 Other disorders of arteries, arterioles and capillaries in diseases classified elsewhere: Secondary | ICD-10-CM | POA: Diagnosis not present

## 2012-04-25 DIAGNOSIS — E785 Hyperlipidemia, unspecified: Secondary | ICD-10-CM

## 2012-04-25 DIAGNOSIS — E1159 Type 2 diabetes mellitus with other circulatory complications: Secondary | ICD-10-CM | POA: Diagnosis not present

## 2012-04-25 DIAGNOSIS — E119 Type 2 diabetes mellitus without complications: Secondary | ICD-10-CM

## 2012-04-25 DIAGNOSIS — Z48812 Encounter for surgical aftercare following surgery on the circulatory system: Secondary | ICD-10-CM

## 2012-04-25 DIAGNOSIS — I6529 Occlusion and stenosis of unspecified carotid artery: Secondary | ICD-10-CM

## 2012-04-25 LAB — COMPREHENSIVE METABOLIC PANEL
ALT: 20 U/L (ref 0–53)
AST: 15 U/L (ref 0–37)
Albumin: 3.3 g/dL — ABNORMAL LOW (ref 3.5–5.2)
Alkaline Phosphatase: 114 U/L (ref 39–117)
BUN: 18 mg/dL (ref 6–23)
CO2: 28 mEq/L (ref 19–32)
Calcium: 8.5 mg/dL (ref 8.4–10.5)
Chloride: 101 mEq/L (ref 96–112)
Creatinine, Ser: 1.2 mg/dL (ref 0.4–1.5)
GFR: 64.71 mL/min (ref >60.00)
Glucose, Bld: 203 mg/dL — ABNORMAL HIGH (ref 70–99)
Potassium: 4.5 mEq/L (ref 3.5–5.1)
Sodium: 136 mEq/L (ref 135–145)
Total Bilirubin: 0.5 mg/dL (ref 0.3–1.2)
Total Protein: 7.3 g/dL (ref 6.0–8.3)

## 2012-04-25 LAB — LIPID PANEL
Cholesterol: 98 mg/dL (ref 0–200)
HDL: 17.1 mg/dL — ABNORMAL LOW (ref >39.00)
LDL Cholesterol: 50 mg/dL (ref 0–99)
Total CHOL/HDL Ratio: 6
Triglycerides: 157 mg/dL — ABNORMAL HIGH (ref 0.0–149.0)
VLDL: 31.4 mg/dL (ref 0.0–40.0)

## 2012-04-25 LAB — HEMOGLOBIN A1C: Hgb A1c MFr Bld: 7.7 % — ABNORMAL HIGH (ref 4.6–6.5)

## 2012-04-25 NOTE — Telephone Encounter (Signed)
Patient came in for labs today stating that he has been dizzy on and off for three weeks.  He hasn't had any medication changes.  Please advise.

## 2012-04-25 NOTE — Telephone Encounter (Signed)
I spoke with patient about this while he was here. He will monitor BP and BG at home and we will follow with visit next week.

## 2012-04-29 ENCOUNTER — Ambulatory Visit (INDEPENDENT_AMBULATORY_CARE_PROVIDER_SITE_OTHER): Payer: Medicare Other | Admitting: Internal Medicine

## 2012-04-29 ENCOUNTER — Encounter: Payer: Self-pay | Admitting: Internal Medicine

## 2012-04-29 VITALS — BP 130/92 | HR 96 | Temp 98.6°F | Ht 66.0 in | Wt 272.5 lb

## 2012-04-29 DIAGNOSIS — Z23 Encounter for immunization: Secondary | ICD-10-CM

## 2012-04-29 DIAGNOSIS — F32A Depression, unspecified: Secondary | ICD-10-CM

## 2012-04-29 DIAGNOSIS — E1159 Type 2 diabetes mellitus with other circulatory complications: Secondary | ICD-10-CM

## 2012-04-29 DIAGNOSIS — F411 Generalized anxiety disorder: Secondary | ICD-10-CM | POA: Diagnosis not present

## 2012-04-29 DIAGNOSIS — I10 Essential (primary) hypertension: Secondary | ICD-10-CM

## 2012-04-29 DIAGNOSIS — F329 Major depressive disorder, single episode, unspecified: Secondary | ICD-10-CM

## 2012-04-29 DIAGNOSIS — R42 Dizziness and giddiness: Secondary | ICD-10-CM

## 2012-04-29 DIAGNOSIS — J0100 Acute maxillary sinusitis, unspecified: Secondary | ICD-10-CM

## 2012-04-29 DIAGNOSIS — I798 Other disorders of arteries, arterioles and capillaries in diseases classified elsewhere: Secondary | ICD-10-CM

## 2012-04-29 DIAGNOSIS — F419 Anxiety disorder, unspecified: Secondary | ICD-10-CM

## 2012-04-29 DIAGNOSIS — M549 Dorsalgia, unspecified: Secondary | ICD-10-CM | POA: Diagnosis not present

## 2012-04-29 DIAGNOSIS — F3289 Other specified depressive episodes: Secondary | ICD-10-CM

## 2012-04-29 DIAGNOSIS — E119 Type 2 diabetes mellitus without complications: Secondary | ICD-10-CM

## 2012-04-29 LAB — MICROALBUMIN / CREATININE URINE RATIO
Creatinine,U: 94.5 mg/dL
Microalb Creat Ratio: 24.7 mg/g (ref 0.0–30.0)
Microalb, Ur: 23.3 mg/dL — ABNORMAL HIGH (ref 0.0–1.9)

## 2012-04-29 MED ORDER — DIAZEPAM 2 MG PO TABS: 2.0000 mg | ORAL_TABLET | Freq: Three times a day (TID) | ORAL | Status: DC | PRN

## 2012-04-29 MED ORDER — MECLIZINE HCL 25 MG PO TABS: 25.0000 mg | ORAL_TABLET | Freq: Three times a day (TID) | ORAL | Status: DC | PRN

## 2012-04-29 MED ORDER — DIAZEPAM 5 MG PO TABS: 5.0000 mg | ORAL_TABLET | Freq: Every evening | ORAL | Status: DC | PRN

## 2012-04-29 MED ORDER — AZITHROMYCIN 250 MG PO TABS: ORAL_TABLET | ORAL | Status: DC

## 2012-04-29 MED ORDER — DULOXETINE HCL 60 MG PO CPEP: 60.0000 mg | ORAL_CAPSULE | Freq: Every day | ORAL | Status: DC

## 2012-04-29 NOTE — Assessment & Plan Note (Signed)
Recent episodes of vertigo. Suspect related to sinus and ear infection. No hypoglycemia, chest pain to suggest other etiology.  Had recent eval of carotids which he reports was normal. Will continue to monitor. Will treat sinusitis. Will use meclizine prn. Follow up later this week if symptoms not improving.

## 2012-04-29 NOTE — Assessment & Plan Note (Signed)
Symptoms of anxiety and depression recently worsening. Pt has follow up with psychologist scheduled. Will continue current meds for now.  Encouraged him to engage in activities outside of his home such as volunteer work.  Follow up 3 months and prn.

## 2012-04-29 NOTE — Assessment & Plan Note (Signed)
BP fairly well controlled on current medications. Recent renal function normal. Urine microalbumin pending. Follow up 3 months.

## 2012-04-29 NOTE — Assessment & Plan Note (Signed)
Exam consistent with maxillary sinusitis and early bilateral OM. Will treat with azithromycin. Follow up later this week if symptoms not improving.

## 2012-04-29 NOTE — Progress Notes (Signed)
Subjective:    Patient ID: [name] [name], male    DOB: [birth year], 56 y.o.   MRN: [id number]  HPI 56 year old male with history of coronary artery disease, diabetes, peripheral vascular disease, hypertension, depression presents for followup. In regards to his diabetes, he reports good control of his blood sugars. He reports full compliance with medications.  He is concerned today about worsening of depression and anxiety over the last week. He denies any specific triggers for this. He notes that he does have followup with a psychologist in 3 weeks. He reports full compliance with both Cymbalta and diazepam. He notes that he is trying to get out of the house more and be more active. He feels limited by ongoing medical issues.  He is also concerned today about recent episodes of vertigo. He denies any other symptoms with the vertigo such as diaphoresis, nausea, chest pain, shortness of breath. Vertigo is intermittent and often occurs after changing head position. He does note some recent nasal congestion. He denies any ear pain or fever. He has not taken any medication for this. He was recently evaluated by his vascular surgeon with carotid Dopplers which he reports were normal.  Outpatient Encounter Prescriptions as of 04/29/2012  Medication Sig Dispense Refill  . buPROPion (WELLBUTRIN XL) 150 MG 24 hr tablet Take 150 mg by mouth daily.       . cholecalciferol (VITAMIN D) 1000 UNITS tablet Take 1,000 Units by mouth daily.        . clopidogrel (PLAVIX) 75 MG tablet Take 75 mg by mouth daily.      . cyclobenzaprine (FLEXERIL) 10 MG tablet Take 10 mg by mouth 2 (two) times daily as needed.       . diazepam (VALIUM) 2 MG tablet Take 1 tablet (2 mg total) by mouth every 8 (eight) hours as needed for anxiety.  90 tablet  3  . diazepam (VALIUM) 5 MG tablet Take 1 tablet (5 mg total) by mouth at bedtime as needed for anxiety.  30 tablet  3  . DULoxetine (CYMBALTA) 60 MG capsule Take 1 capsule (60 mg total)  by mouth daily.  30 capsule  6  . glucose blood test strip 1 each by Other route 3 (three) times daily as needed. Dx: 250.00      . insulin glargine (LANTUS SOLOSTAR) 100 UNIT/ML injection Inject 60 Units into the skin 2 (two) times daily.  15 mL  12  . Lactulose 20 GM/30ML SOLN Take 30 mLs by mouth every 2 (two) hours as needed.      . Liraglutide (VICTOZA) 18 MG/3ML SOLN Inject 0.2 mLs (1.2 mg total) into the skin daily.  6 mL  3  . metoprolol succinate (TOPROL-XL) 50 MG 24 hr tablet Take 1 tablet (50 mg total) by mouth 2 (two) times daily.  60 tablet  6  . Multiple Vitamin (MULTIVITAMIN) capsule Take 1 capsule by mouth daily.        . nitroGLYCERIN (NITROSTAT) 0.4 MG SL tablet Place 0.4 mg under the tongue every 5 (five) minutes as needed.      [name] NOVOFINE 32G X 6 MM MISC use once daily  100 each  3  . oxyCODONE (OXYCONTIN) 10 MG 12 hr tablet Take 1 tablet (10 mg total) by mouth every 12 (twelve) hours.  60 tablet  0  . oxyCODONE-acetaminophen (PERCOCET/ROXICET) 5-325 MG per tablet Take 2 tablets by mouth every 8 (eight) hours as needed.  90 tablet  0  . simvastatin (ZOCOR)  20 MG tablet Take 1 tablet (20 mg total) by mouth at bedtime.  30 tablet  6  . azithromycin (ZITHROMAX Z-PAK) 250 MG tablet Take 2 pills day 1, then 1 pill daily days 2-5  6 each  0  . meclizine (ANTIVERT) 25 MG tablet Take 1 tablet (25 mg total) by mouth 3 (three) times daily as needed.  30 tablet  0    Review of Systems  Constitutional: Negative for fever, chills, activity change, appetite change, fatigue and unexpected weight change.  Eyes: Negative for visual disturbance.  Respiratory: Negative for cough and shortness of breath.   Cardiovascular: Negative for chest pain, palpitations and leg swelling.  Gastrointestinal: Negative for abdominal pain and abdominal distention.  Genitourinary: Negative for dysuria, urgency and difficulty urinating.  Musculoskeletal: Negative for arthralgias and gait problem.  Skin:  Negative for color change and rash.  Neurological: Positive for dizziness.  Hematological: Negative for adenopathy.  Psychiatric/Behavioral: Positive for dysphoric mood. Negative for disturbed wake/sleep cycle. The patient is nervous/anxious.        Objective:   Physical Exam  Constitutional: He is oriented to person, place, and time. He appears well-developed and well-nourished. No distress.  HENT:  Head: Normocephalic and atraumatic.  Right Ear: External ear normal. Tympanic membrane is erythematous. A middle ear effusion is present.  Left Ear: External ear normal. Tympanic membrane is erythematous. A middle ear effusion is present.  Nose: Nose normal.  Mouth/Throat: Oropharynx is clear and moist. No oropharyngeal exudate.  Eyes: Conjunctivae normal and EOM are normal. Pupils are equal, round, and reactive to light. Right eye exhibits no discharge. Left eye exhibits no discharge. No scleral icterus.  Neck: Normal range of motion. Neck supple. No tracheal deviation present. No thyromegaly present.  Cardiovascular: Normal rate, regular rhythm and normal heart sounds.  Exam reveals no gallop and no friction rub.   No murmur heard. Pulmonary/Chest: Effort normal and breath sounds normal. No respiratory distress. He has no wheezes. He has no rales. He exhibits no tenderness.  Musculoskeletal: Normal range of motion. He exhibits no edema.  Lymphadenopathy:    He has no cervical adenopathy.  Neurological: He is alert and oriented to person, place, and time. No cranial nerve deficit. Coordination normal.  Skin: Skin is warm and dry. No rash noted. He is not diaphoretic. No erythema. No pallor.  Psychiatric: He has a normal mood and affect. His behavior is normal. Judgment and thought content normal.          Assessment & Plan:

## 2012-04-29 NOTE — Assessment & Plan Note (Signed)
Blood sugars recently well controlled. A1c 7.7%. Will continue current medications. Follow up 3 months and prn.

## 2012-05-01 ENCOUNTER — Encounter: Payer: Self-pay | Admitting: Vascular Surgery

## 2012-05-06 DIAGNOSIS — B351 Tinea unguium: Secondary | ICD-10-CM | POA: Diagnosis not present

## 2012-05-06 DIAGNOSIS — M79609 Pain in unspecified limb: Secondary | ICD-10-CM | POA: Diagnosis not present

## 2012-05-10 ENCOUNTER — Other Ambulatory Visit: Payer: Self-pay | Admitting: Internal Medicine

## 2012-05-21 ENCOUNTER — Ambulatory Visit: Payer: Medicare Other | Admitting: Psychology

## 2012-05-22 ENCOUNTER — Telehealth: Payer: Self-pay | Admitting: Internal Medicine

## 2012-05-22 NOTE — Telephone Encounter (Signed)
Pt called to see if we have any samples victoza

## 2012-05-28 ENCOUNTER — Ambulatory Visit (INDEPENDENT_AMBULATORY_CARE_PROVIDER_SITE_OTHER): Payer: Medicare Other | Admitting: Psychology

## 2012-05-28 DIAGNOSIS — F4310 Post-traumatic stress disorder, unspecified: Secondary | ICD-10-CM

## 2012-05-28 DIAGNOSIS — F331 Major depressive disorder, recurrent, moderate: Secondary | ICD-10-CM | POA: Diagnosis not present

## 2012-05-29 NOTE — Telephone Encounter (Signed)
Called patient on cell  And he stated that he was by here this week to pick up th e victoza.

## 2012-06-04 ENCOUNTER — Encounter: Payer: Self-pay | Admitting: Internal Medicine

## 2012-06-04 ENCOUNTER — Ambulatory Visit (INDEPENDENT_AMBULATORY_CARE_PROVIDER_SITE_OTHER): Payer: Medicare Other | Admitting: Internal Medicine

## 2012-06-04 ENCOUNTER — Ambulatory Visit: Payer: Medicare Other | Admitting: Psychology

## 2012-06-04 VITALS — BP 130/80 | HR 74 | Temp 98.1°F | Ht 66.0 in | Wt 274.2 lb

## 2012-06-04 DIAGNOSIS — H6693 Otitis media, unspecified, bilateral: Secondary | ICD-10-CM | POA: Insufficient documentation

## 2012-06-04 DIAGNOSIS — E785 Hyperlipidemia, unspecified: Secondary | ICD-10-CM

## 2012-06-04 DIAGNOSIS — H6690 Otitis media, unspecified, unspecified ear: Secondary | ICD-10-CM | POA: Diagnosis not present

## 2012-06-04 DIAGNOSIS — R3911 Hesitancy of micturition: Secondary | ICD-10-CM | POA: Diagnosis not present

## 2012-06-04 MED ORDER — LEVOFLOXACIN 500 MG PO TABS: 500.0000 mg | ORAL_TABLET | Freq: Every day | ORAL | Status: DC

## 2012-06-04 MED ORDER — SIMVASTATIN 20 MG PO TABS: 20.0000 mg | ORAL_TABLET | Freq: Every day | ORAL | Status: DC

## 2012-06-04 NOTE — Assessment & Plan Note (Signed)
Intermittent urinary hesitancy. Question distal urethral obstruction based on description of symptoms. Will set up urology evaluation.

## 2012-06-04 NOTE — Assessment & Plan Note (Signed)
Symptoms and exam consistent with bilateral otitis media. Will treat with levaquin. Pt will call with update later this week. If no improvement, then pt will set up ENT referral.

## 2012-06-04 NOTE — Progress Notes (Signed)
Subjective:    Patient ID: [name] [name], male    DOB: 1956/05/28, 56 y.o.   MRN: [id number]  HPI 56 year old male with history of diabetes, hypertension, hyperlipidemia, coronary artery disease, peripheral vascular disease presents for acute visit complaining of 2 week history of bilateral ear pain and nasal congestion. He denies any fever or chills. He denies any change in hearing. He was previously treated for an ear infection approximately one month ago and noted some improvement in symptoms, however then had recurrence of symptoms about 2 weeks ago. He is not currently taking any medication for this. Next  He is also concerned today about occasional difficulty urinating. He reports that his penis occasionally becomes "drawn up" and he cannot extend it to urinate.  He describes this as painful.  He is not currently experiencing this issue, as it only occurs intermittently.  He denies fever, chills, flank pain.   Outpatient Encounter Prescriptions as of 06/04/2012  Medication Sig Dispense Refill  . buPROPion (WELLBUTRIN XL) 150 MG 24 hr tablet Take 150 mg by mouth daily.       . cholecalciferol (VITAMIN D) 1000 UNITS tablet Take 1,000 Units by mouth daily.        . clopidogrel (PLAVIX) 75 MG tablet Take 75 mg by mouth daily.      . cyclobenzaprine (FLEXERIL) 10 MG tablet Take 10 mg by mouth 2 (two) times daily as needed.       . diazepam (VALIUM) 2 MG tablet Take 2 mg by mouth 3 (three) times daily.      . diazepam (VALIUM) 5 MG tablet Take 1 tablet (5 mg total) by mouth at bedtime as needed for anxiety.  30 tablet  3  . DULoxetine (CYMBALTA) 60 MG capsule Take 1 capsule (60 mg total) by mouth daily.  30 capsule  6  . glucose blood test strip 1 each by Other route 3 (three) times daily as needed. Dx: 250.00      . insulin glargine (LANTUS SOLOSTAR) 100 UNIT/ML injection Inject 60 Units into the skin 2 (two) times daily.  15 mL  12  . Lactulose 20 GM/30ML SOLN Take 30 mLs by mouth every 2  (two) hours as needed.      [name] levofloxacin (LEVAQUIN) 500 MG tablet Take 1 tablet (500 mg total) by mouth daily.  7 tablet  0  . Liraglutide (VICTOZA) 18 MG/3ML SOLN Inject 0.2 mLs (1.2 mg total) into the skin daily.  6 mL  3  . meclizine (ANTIVERT) 25 MG tablet Take 1 tablet (25 mg total) by mouth 3 (three) times daily as needed.  30 tablet  0  . metoprolol succinate (TOPROL-XL) 50 MG 24 hr tablet Take 1 tablet (50 mg total) by mouth 2 (two) times daily.  60 tablet  6  . Multiple Vitamin (MULTIVITAMIN) capsule Take 1 capsule by mouth daily.        . nitroGLYCERIN (NITROSTAT) 0.4 MG SL tablet Place 0.4 mg under the tongue every 5 (five) minutes as needed.      [name] NOVOFINE 32G X 6 MM MISC use once daily  100 each  3  . oxyCODONE (OXYCONTIN) 10 MG 12 hr tablet Take 1 tablet (10 mg total) by mouth every 12 (twelve) hours.  60 tablet  0  . oxyCODONE-acetaminophen (PERCOCET/ROXICET) 5-325 MG per tablet Take 2 tablets by mouth every 8 (eight) hours as needed.  90 tablet  0  . simvastatin (ZOCOR) 20 MG tablet Take 1 tablet (20 mg  total) by mouth at bedtime.  30 tablet  6   BP 130/80  Pulse 74  Temp 98.1 F (36.7 C) (Oral)  Ht 5\' 6"  (1.676 m)  Wt 274 lb 4 oz (124.399 kg)  BMI 44.27 kg/m2  SpO2 99%  Review of Systems  Constitutional: Negative for fever, chills, activity change, appetite change, fatigue and unexpected weight change.  HENT: Positive for ear pain, congestion and sinus pressure.   Eyes: Negative for visual disturbance.  Respiratory: Negative for cough and shortness of breath.   Cardiovascular: Negative for chest pain, palpitations and leg swelling.  Gastrointestinal: Negative for abdominal pain and abdominal distention.  Genitourinary: Positive for difficulty urinating and penile pain. Negative for dysuria, urgency, frequency, hematuria, flank pain, discharge, penile swelling and genital sores.  Musculoskeletal: Negative for arthralgias and gait problem.  Skin: Negative for color  change and rash.  Hematological: Negative for adenopathy.  Psychiatric/Behavioral: Negative for sleep disturbance and dysphoric mood. The patient is not nervous/anxious.        Objective:   Physical Exam  Constitutional: He is oriented to person, place, and time. He appears well-developed and well-nourished. No distress.  HENT:  Head: Normocephalic and atraumatic.  Right Ear: External ear normal. Tympanic membrane is erythematous and bulging. A middle ear effusion is present.  Left Ear: External ear normal. Tympanic membrane is erythematous and bulging. A middle ear effusion is present.  Nose: Nose normal.  Mouth/Throat: Oropharynx is clear and moist. No oropharyngeal exudate.  Eyes: Conjunctivae normal and EOM are normal. Pupils are equal, round, and reactive to light. Right eye exhibits no discharge. Left eye exhibits no discharge. No scleral icterus.  Neck: Normal range of motion. Neck supple. No tracheal deviation present. No thyromegaly present.  Cardiovascular: Normal rate, regular rhythm and normal heart sounds.  Exam reveals no gallop and no friction rub.   No murmur heard. Pulmonary/Chest: Effort normal and breath sounds normal. No respiratory distress. He has no wheezes. He has no rales. He exhibits no tenderness.  Musculoskeletal: Normal range of motion. He exhibits no edema.  Lymphadenopathy:    He has no cervical adenopathy.  Neurological: He is alert and oriented to person, place, and time. No cranial nerve deficit. Coordination normal.  Skin: Skin is warm and dry. No rash noted. He is not diaphoretic. No erythema. No pallor.  Psychiatric: He has a normal mood and affect. His behavior is normal. Judgment and thought content normal.          Assessment & Plan:

## 2012-06-06 DIAGNOSIS — E119 Type 2 diabetes mellitus without complications: Secondary | ICD-10-CM | POA: Insufficient documentation

## 2012-06-06 DIAGNOSIS — Z87891 Personal history of nicotine dependence: Secondary | ICD-10-CM | POA: Insufficient documentation

## 2012-06-06 DIAGNOSIS — E118 Type 2 diabetes mellitus with unspecified complications: Secondary | ICD-10-CM | POA: Insufficient documentation

## 2012-06-06 DIAGNOSIS — F17200 Nicotine dependence, unspecified, uncomplicated: Secondary | ICD-10-CM | POA: Insufficient documentation

## 2012-06-06 DIAGNOSIS — R3911 Hesitancy of micturition: Secondary | ICD-10-CM | POA: Diagnosis not present

## 2012-06-06 DIAGNOSIS — I25119 Atherosclerotic heart disease of native coronary artery with unspecified angina pectoris: Secondary | ICD-10-CM | POA: Insufficient documentation

## 2012-06-06 DIAGNOSIS — E1129 Type 2 diabetes mellitus with other diabetic kidney complication: Secondary | ICD-10-CM | POA: Insufficient documentation

## 2012-06-06 DIAGNOSIS — Z720 Tobacco use: Secondary | ICD-10-CM | POA: Insufficient documentation

## 2012-06-06 DIAGNOSIS — R3915 Urgency of urination: Secondary | ICD-10-CM | POA: Diagnosis not present

## 2012-06-06 DIAGNOSIS — I2510 Atherosclerotic heart disease of native coronary artery without angina pectoris: Secondary | ICD-10-CM | POA: Insufficient documentation

## 2012-06-09 ENCOUNTER — Telehealth: Payer: Self-pay | Admitting: Internal Medicine

## 2012-06-09 DIAGNOSIS — H6693 Otitis media, unspecified, bilateral: Secondary | ICD-10-CM

## 2012-06-09 DIAGNOSIS — R42 Dizziness and giddiness: Secondary | ICD-10-CM

## 2012-06-09 NOTE — Telephone Encounter (Signed)
Pt left message on Friday 11/15 to let dr walker know that there was still no difference in his ears

## 2012-06-09 NOTE — Telephone Encounter (Signed)
We should set up evaluation with ENT.

## 2012-06-10 NOTE — Telephone Encounter (Signed)
Left message on machine at home advising patient as instructed.  Asked patient to call back tomorrow with the name of ENT that he has seen in the past and if he hasn't seen one in the past where would he prefer to go.

## 2012-06-11 ENCOUNTER — Ambulatory Visit: Payer: Medicare Other | Admitting: Psychology

## 2012-06-11 NOTE — Telephone Encounter (Signed)
Pt called back he didn't care which ent whoever dr walker recommends

## 2012-06-15 ENCOUNTER — Emergency Department: Payer: Self-pay | Admitting: Emergency Medicine

## 2012-06-15 DIAGNOSIS — H9209 Otalgia, unspecified ear: Secondary | ICD-10-CM | POA: Diagnosis not present

## 2012-06-15 DIAGNOSIS — F341 Dysthymic disorder: Secondary | ICD-10-CM | POA: Diagnosis not present

## 2012-06-15 DIAGNOSIS — E119 Type 2 diabetes mellitus without complications: Secondary | ICD-10-CM | POA: Diagnosis not present

## 2012-06-15 DIAGNOSIS — I10 Essential (primary) hypertension: Secondary | ICD-10-CM | POA: Diagnosis not present

## 2012-06-16 ENCOUNTER — Other Ambulatory Visit: Payer: Self-pay | Admitting: General Practice

## 2012-06-16 DIAGNOSIS — H60509 Unspecified acute noninfective otitis externa, unspecified ear: Secondary | ICD-10-CM | POA: Diagnosis not present

## 2012-06-16 DIAGNOSIS — H66009 Acute suppurative otitis media without spontaneous rupture of ear drum, unspecified ear: Secondary | ICD-10-CM | POA: Diagnosis not present

## 2012-06-16 MED ORDER — BUPROPION HCL ER (XL) 150 MG PO TB24: 150.0000 mg | ORAL_TABLET | Freq: Every day | ORAL | Status: DC

## 2012-06-16 NOTE — Telephone Encounter (Signed)
Med called in

## 2012-06-16 NOTE — Telephone Encounter (Signed)
Fine to refill 

## 2012-06-16 NOTE — Telephone Encounter (Signed)
Wellbutrin refill request. Med last filled 06/04/11. Pt last seen 06/04/12. Ok to refill?

## 2012-06-17 ENCOUNTER — Other Ambulatory Visit: Payer: Self-pay | Admitting: Internal Medicine

## 2012-06-17 NOTE — Telephone Encounter (Signed)
Referral placed.

## 2012-06-17 NOTE — Telephone Encounter (Signed)
I would recommend [facility].

## 2012-06-18 ENCOUNTER — Ambulatory Visit (INDEPENDENT_AMBULATORY_CARE_PROVIDER_SITE_OTHER): Payer: Medicare Other | Admitting: Psychology

## 2012-06-18 DIAGNOSIS — F331 Major depressive disorder, recurrent, moderate: Secondary | ICD-10-CM

## 2012-06-18 DIAGNOSIS — F4310 Post-traumatic stress disorder, unspecified: Secondary | ICD-10-CM | POA: Diagnosis not present

## 2012-06-24 ENCOUNTER — Other Ambulatory Visit: Payer: Self-pay | Admitting: Cardiovascular Disease

## 2012-06-24 DIAGNOSIS — H9319 Tinnitus, unspecified ear: Secondary | ICD-10-CM | POA: Diagnosis not present

## 2012-06-24 DIAGNOSIS — H903 Sensorineural hearing loss, bilateral: Secondary | ICD-10-CM | POA: Diagnosis not present

## 2012-06-24 DIAGNOSIS — K117 Disturbances of salivary secretion: Secondary | ICD-10-CM | POA: Diagnosis not present

## 2012-06-24 DIAGNOSIS — H905 Unspecified sensorineural hearing loss: Secondary | ICD-10-CM | POA: Diagnosis not present

## 2012-06-24 DIAGNOSIS — H66009 Acute suppurative otitis media without spontaneous rupture of ear drum, unspecified ear: Secondary | ICD-10-CM | POA: Diagnosis not present

## 2012-06-24 MED ORDER — NITROGLYCERIN 0.4 MG SL SUBL: 0.4000 mg | SUBLINGUAL_TABLET | SUBLINGUAL | Status: DC | PRN

## 2012-06-24 NOTE — Telephone Encounter (Signed)
Pt washed bottle in pants pocket

## 2012-06-24 NOTE — Telephone Encounter (Signed)
Refilled Nitroglycerin 

## 2012-06-25 ENCOUNTER — Ambulatory Visit: Payer: Medicare Other | Admitting: Psychology

## 2012-07-02 ENCOUNTER — Ambulatory Visit (INDEPENDENT_AMBULATORY_CARE_PROVIDER_SITE_OTHER): Payer: Medicare Other | Admitting: Psychology

## 2012-07-02 DIAGNOSIS — F4310 Post-traumatic stress disorder, unspecified: Secondary | ICD-10-CM | POA: Diagnosis not present

## 2012-07-02 DIAGNOSIS — F331 Major depressive disorder, recurrent, moderate: Secondary | ICD-10-CM | POA: Diagnosis not present

## 2012-07-03 ENCOUNTER — Ambulatory Visit (INDEPENDENT_AMBULATORY_CARE_PROVIDER_SITE_OTHER): Payer: Medicare Other | Admitting: Internal Medicine

## 2012-07-03 ENCOUNTER — Encounter: Payer: Self-pay | Admitting: Internal Medicine

## 2012-07-03 VITALS — BP 130/80 | HR 87 | Temp 98.2°F | Resp 16 | Ht 68.5 in | Wt 275.2 lb

## 2012-07-03 DIAGNOSIS — F3289 Other specified depressive episodes: Secondary | ICD-10-CM

## 2012-07-03 DIAGNOSIS — H6693 Otitis media, unspecified, bilateral: Secondary | ICD-10-CM

## 2012-07-03 DIAGNOSIS — H6690 Otitis media, unspecified, unspecified ear: Secondary | ICD-10-CM

## 2012-07-03 DIAGNOSIS — E1159 Type 2 diabetes mellitus with other circulatory complications: Secondary | ICD-10-CM

## 2012-07-03 DIAGNOSIS — R3911 Hesitancy of micturition: Secondary | ICD-10-CM

## 2012-07-03 DIAGNOSIS — Z23 Encounter for immunization: Secondary | ICD-10-CM

## 2012-07-03 DIAGNOSIS — I798 Other disorders of arteries, arterioles and capillaries in diseases classified elsewhere: Secondary | ICD-10-CM

## 2012-07-03 DIAGNOSIS — F411 Generalized anxiety disorder: Secondary | ICD-10-CM

## 2012-07-03 DIAGNOSIS — F329 Major depressive disorder, single episode, unspecified: Secondary | ICD-10-CM

## 2012-07-03 DIAGNOSIS — F419 Anxiety disorder, unspecified: Secondary | ICD-10-CM

## 2012-07-03 DIAGNOSIS — F32A Depression, unspecified: Secondary | ICD-10-CM

## 2012-07-03 MED ORDER — FLUOXETINE HCL 20 MG PO TABS: 20.0000 mg | ORAL_TABLET | Freq: Every day | ORAL | Status: DC

## 2012-07-03 MED ORDER — PNEUMOCOCCAL VAC POLYVALENT 25 MCG/0.5ML IJ INJ: 0.5000 mL | INJECTION | Freq: Once | INTRAMUSCULAR | Status: DC

## 2012-07-03 MED ORDER — DIAZEPAM 2 MG PO TABS: 2.0000 mg | ORAL_TABLET | Freq: Three times a day (TID) | ORAL | Status: DC

## 2012-07-03 NOTE — Progress Notes (Signed)
Subjective:    Patient ID: [name] [name], male    DOB: 01/15/1956, 56 y.o.   MRN: [id number]  HPI 56 year old male with history of coronary artery disease, diabetes, hypertension, hyperlipidemia, and depression presents for followup. He reports that he was recently evaluated for his bilateral ear infection by ENT physician. He was put on both oral antibiotics and topical drops with improvement in his symptoms. He denies any recurrent ear pain, hearing loss, fever, chills.  He reports through the last several weeks his depression has been worsening. He reports compliance with Wellbutrin. In the past, he had taken Effexor and Lexapro with some improvement in his symptoms. He is currently on Cymbalta which helps with pain but has not made a difference in his symptoms of depressed mood. He reports frequent sadness/depressed mood. He denies any suicidal ideation.  In regards to diabetes, he reports that over the last few weeks his blood sugars have been slightly higher than normal which he attributes to ear infection. He reports fasting sugars have been between 180 and 190. He reports full compliance with medications.  Outpatient Encounter Prescriptions as of 07/03/2012  Medication Sig Dispense Refill  . buPROPion (WELLBUTRIN XL) 150 MG 24 hr tablet Take 1 tablet (150 mg total) by mouth daily.  30 tablet  6  . cholecalciferol (VITAMIN D) 1000 UNITS tablet Take 1,000 Units by mouth daily.        . clopidogrel (PLAVIX) 75 MG tablet Take 75 mg by mouth daily.      . cyclobenzaprine (FLEXERIL) 10 MG tablet Take 10 mg by mouth 2 (two) times daily as needed.       . diazepam (VALIUM) 2 MG tablet Take 1 tablet (2 mg total) by mouth 3 (three) times daily.  90 tablet  3  . diazepam (VALIUM) 5 MG tablet Take 1 tablet (5 mg total) by mouth at bedtime as needed for anxiety.  30 tablet  3  . DULoxetine (CYMBALTA) 60 MG capsule Take 1 capsule (60 mg total) by mouth daily.  30 capsule  6  . glucose blood test strip  1 each by Other route 3 (three) times daily as needed. Dx: 250.00      . insulin glargine (LANTUS SOLOSTAR) 100 UNIT/ML injection Inject 60 Units into the skin 2 (two) times daily.  15 mL  12  . Lactulose 20 GM/30ML SOLN Take 30 mLs by mouth every 2 (two) hours as needed.      [name] levofloxacin (LEVAQUIN) 500 MG tablet Take 1 tablet (500 mg total) by mouth daily.  7 tablet  0  . LIDODERM 5 %       . Liraglutide (VICTOZA) 18 MG/3ML SOLN Inject 0.2 mLs (1.2 mg total) into the skin daily.  6 mL  3  . meclizine (ANTIVERT) 25 MG tablet Take 1 tablet (25 mg total) by mouth 3 (three) times daily as needed.  30 tablet  0  . metoprolol succinate (TOPROL-XL) 50 MG 24 hr tablet take 1 tablet by mouth twice a day  60 tablet  6  . Multiple Vitamin (MULTIVITAMIN) capsule Take 1 capsule by mouth daily.        . nitroGLYCERIN (NITROSTAT) 0.4 MG SL tablet Place 1 tablet (0.4 mg total) under the tongue every 5 (five) minutes as needed.  25 tablet  1  . NOVOFINE 32G X 6 MM MISC use once daily  100 each  3  . oxyCODONE (OXYCONTIN) 10 MG 12 hr tablet Take 1 tablet (10 mg  total) by mouth every 12 (twelve) hours.  60 tablet  0  . oxyCODONE-acetaminophen (PERCOCET/ROXICET) 5-325 MG per tablet Take 2 tablets by mouth every 8 (eight) hours as needed.  90 tablet  0  . simvastatin (ZOCOR) 20 MG tablet Take 1 tablet (20 mg total) by mouth at bedtime.  30 tablet  6  . [DISCONTINUED] diazepam (VALIUM) 2 MG tablet Take 2 mg by mouth 3 (three) times daily.      [name] FLUoxetine (PROZAC) 20 MG tablet Take 1 tablet (20 mg total) by mouth daily.  30 tablet  6   Facility-Administered Encounter Medications as of 07/03/2012  Medication Dose Route Frequency Provider Last Rate Last Dose  . pneumococcal 23 valent vaccine (PNU-IMMUNE) injection 0.5 mL  0.5 mL Intramuscular Once Jackolyn Confer, MD       BP 130/80  Pulse 87  Temp 98.2 F (36.8 C) (Oral)  Resp 16  Ht 5' 8.5" (1.74 m)  Wt 275 lb 4 oz (124.853 kg)  BMI 41.24 kg/m2  SpO2  96%  Review of Systems  Constitutional: Negative for fever, chills, activity change, appetite change, fatigue and unexpected weight change.  Eyes: Negative for visual disturbance.  Respiratory: Negative for cough and shortness of breath.   Cardiovascular: Negative for chest pain, palpitations and leg swelling.  Gastrointestinal: Negative for abdominal pain and abdominal distention.  Genitourinary: Negative for dysuria, urgency and difficulty urinating.  Musculoskeletal: Negative for arthralgias and gait problem.  Skin: Negative for color change and rash.  Hematological: Negative for adenopathy.  Psychiatric/Behavioral: Positive for dysphoric mood. Negative for sleep disturbance. The patient is nervous/anxious.        Objective:   Physical Exam  Constitutional: He is oriented to person, place, and time. He appears well-developed and well-nourished. No distress.  HENT:  Head: Normocephalic and atraumatic.  Right Ear: External ear normal.  Left Ear: External ear normal.  Nose: Nose normal.  Mouth/Throat: Oropharynx is clear and moist. No oropharyngeal exudate.  Eyes: Conjunctivae normal and EOM are normal. Pupils are equal, round, and reactive to light. Right eye exhibits no discharge. Left eye exhibits no discharge. No scleral icterus.  Neck: Normal range of motion. Neck supple. No tracheal deviation present. No thyromegaly present.  Cardiovascular: Normal rate and regular rhythm.  Exam reveals no gallop and no friction rub.   Murmur heard. Pulmonary/Chest: Effort normal and breath sounds normal. No respiratory distress. He has no wheezes. He has no rales. He exhibits no tenderness.  Musculoskeletal: Normal range of motion. He exhibits no edema.  Lymphadenopathy:    He has no cervical adenopathy.  Neurological: He is alert and oriented to person, place, and time. No cranial nerve deficit. Coordination normal.  Skin: Skin is warm and dry. No rash noted. He is not diaphoretic. No  erythema. No pallor.  Psychiatric: His behavior is normal. Judgment and thought content normal. He exhibits a depressed mood.          Assessment & Plan:

## 2012-07-03 NOTE — Assessment & Plan Note (Signed)
Symptoms improved after recent treatment with oral and topical antibiotics. Exam normal today. We'll continue to monitor.

## 2012-07-03 NOTE — Assessment & Plan Note (Signed)
Symptoms of depression recently worsening. Will try adding Prozac to current medications. Patient will call if any problems with this medication. Followup in one month or sooner as needed. Continue with counseling as scheduled.

## 2012-07-03 NOTE — Assessment & Plan Note (Signed)
Patient reports that recent urological exam was normal. Will request notes on this.

## 2012-07-03 NOTE — Assessment & Plan Note (Signed)
Will plan to recheck A1c with labs in 1 month. Continue current medications.

## 2012-07-09 ENCOUNTER — Ambulatory Visit (INDEPENDENT_AMBULATORY_CARE_PROVIDER_SITE_OTHER): Payer: Medicare Other | Admitting: Psychology

## 2012-07-09 DIAGNOSIS — F331 Major depressive disorder, recurrent, moderate: Secondary | ICD-10-CM | POA: Diagnosis not present

## 2012-07-09 DIAGNOSIS — F4310 Post-traumatic stress disorder, unspecified: Secondary | ICD-10-CM

## 2012-07-10 DIAGNOSIS — R39198 Other difficulties with micturition: Secondary | ICD-10-CM | POA: Diagnosis not present

## 2012-07-14 DIAGNOSIS — E785 Hyperlipidemia, unspecified: Secondary | ICD-10-CM | POA: Diagnosis not present

## 2012-07-14 DIAGNOSIS — E119 Type 2 diabetes mellitus without complications: Secondary | ICD-10-CM | POA: Diagnosis not present

## 2012-07-14 DIAGNOSIS — I70219 Atherosclerosis of native arteries of extremities with intermittent claudication, unspecified extremity: Secondary | ICD-10-CM | POA: Diagnosis not present

## 2012-07-14 DIAGNOSIS — I10 Essential (primary) hypertension: Secondary | ICD-10-CM | POA: Diagnosis not present

## 2012-07-21 ENCOUNTER — Other Ambulatory Visit: Payer: Self-pay | Admitting: Internal Medicine

## 2012-07-29 ENCOUNTER — Other Ambulatory Visit: Payer: Medicare Other

## 2012-07-29 ENCOUNTER — Ambulatory Visit: Payer: Self-pay | Admitting: Vascular Surgery

## 2012-07-29 DIAGNOSIS — Z7902 Long term (current) use of antithrombotics/antiplatelets: Secondary | ICD-10-CM | POA: Diagnosis not present

## 2012-07-29 DIAGNOSIS — F17200 Nicotine dependence, unspecified, uncomplicated: Secondary | ICD-10-CM | POA: Diagnosis not present

## 2012-07-29 DIAGNOSIS — E119 Type 2 diabetes mellitus without complications: Secondary | ICD-10-CM | POA: Diagnosis not present

## 2012-07-29 DIAGNOSIS — R252 Cramp and spasm: Secondary | ICD-10-CM | POA: Diagnosis not present

## 2012-07-29 DIAGNOSIS — I70229 Atherosclerosis of native arteries of extremities with rest pain, unspecified extremity: Secondary | ICD-10-CM | POA: Diagnosis not present

## 2012-07-29 DIAGNOSIS — Z79899 Other long term (current) drug therapy: Secondary | ICD-10-CM | POA: Diagnosis not present

## 2012-07-29 DIAGNOSIS — Z7982 Long term (current) use of aspirin: Secondary | ICD-10-CM | POA: Diagnosis not present

## 2012-07-29 DIAGNOSIS — E7800 Pure hypercholesterolemia, unspecified: Secondary | ICD-10-CM | POA: Diagnosis not present

## 2012-07-29 DIAGNOSIS — I63239 Cerebral infarction due to unspecified occlusion or stenosis of unspecified carotid arteries: Secondary | ICD-10-CM | POA: Diagnosis not present

## 2012-07-29 DIAGNOSIS — I10 Essential (primary) hypertension: Secondary | ICD-10-CM | POA: Diagnosis not present

## 2012-07-29 DIAGNOSIS — I2510 Atherosclerotic heart disease of native coronary artery without angina pectoris: Secondary | ICD-10-CM | POA: Diagnosis not present

## 2012-07-29 LAB — BASIC METABOLIC PANEL
Anion Gap: 10 (ref 7–16)
BUN: 21 mg/dL — ABNORMAL HIGH (ref 7–18)
Calcium, Total: 9 mg/dL (ref 8.5–10.1)
Chloride: 100 mmol/L (ref 98–107)
Co2: 24 mmol/L (ref 21–32)
Creatinine: 1.47 mg/dL — ABNORMAL HIGH (ref 0.60–1.30)
EGFR (African American): >60
EGFR (Non-African Amer.): 52 — ABNORMAL LOW
Glucose: 445 mg/dL — ABNORMAL HIGH (ref 65–99)
Osmolality: 290 (ref 275–301)
Potassium: 5 mmol/L (ref 3.5–5.1)
Sodium: 134 mmol/L — ABNORMAL LOW (ref 136–145)

## 2012-07-30 ENCOUNTER — Other Ambulatory Visit: Payer: Medicare Other

## 2012-08-01 ENCOUNTER — Other Ambulatory Visit (INDEPENDENT_AMBULATORY_CARE_PROVIDER_SITE_OTHER): Payer: Medicare Other

## 2012-08-01 DIAGNOSIS — I798 Other disorders of arteries, arterioles and capillaries in diseases classified elsewhere: Secondary | ICD-10-CM

## 2012-08-01 DIAGNOSIS — E119 Type 2 diabetes mellitus without complications: Secondary | ICD-10-CM

## 2012-08-01 DIAGNOSIS — E1159 Type 2 diabetes mellitus with other circulatory complications: Secondary | ICD-10-CM

## 2012-08-01 LAB — COMPREHENSIVE METABOLIC PANEL WITH GFR
ALT: 21 U/L (ref 0–53)
AST: 20 U/L (ref 0–37)
Albumin: 3.4 g/dL — ABNORMAL LOW (ref 3.5–5.2)
Alkaline Phosphatase: 108 U/L (ref 39–117)
BUN: 19 mg/dL (ref 6–23)
CO2: 25 meq/L (ref 19–32)
Calcium: 8.5 mg/dL (ref 8.4–10.5)
Chloride: 103 meq/L (ref 96–112)
Creatinine, Ser: 1.4 mg/dL (ref 0.4–1.5)
GFR: 55.62 mL/min — ABNORMAL LOW
Glucose, Bld: 191 mg/dL — ABNORMAL HIGH (ref 70–99)
Potassium: 4.5 meq/L (ref 3.5–5.1)
Sodium: 138 meq/L (ref 135–145)
Total Bilirubin: 0.4 mg/dL (ref 0.3–1.2)
Total Protein: 7.1 g/dL (ref 6.0–8.3)

## 2012-08-01 LAB — HEMOGLOBIN A1C: Hgb A1c MFr Bld: 10.3 % — ABNORMAL HIGH (ref 4.6–6.5)

## 2012-08-04 ENCOUNTER — Ambulatory Visit: Payer: Medicare Other | Admitting: Internal Medicine

## 2012-08-06 ENCOUNTER — Ambulatory Visit (INDEPENDENT_AMBULATORY_CARE_PROVIDER_SITE_OTHER): Payer: Medicare Other | Admitting: Internal Medicine

## 2012-08-06 ENCOUNTER — Encounter: Payer: Self-pay | Admitting: Internal Medicine

## 2012-08-06 VITALS — BP 132/80 | HR 78 | Temp 98.1°F | Ht 68.5 in | Wt 276.5 lb

## 2012-08-06 DIAGNOSIS — F419 Anxiety disorder, unspecified: Secondary | ICD-10-CM

## 2012-08-06 DIAGNOSIS — F411 Generalized anxiety disorder: Secondary | ICD-10-CM | POA: Diagnosis not present

## 2012-08-06 DIAGNOSIS — E1159 Type 2 diabetes mellitus with other circulatory complications: Secondary | ICD-10-CM

## 2012-08-06 DIAGNOSIS — I798 Other disorders of arteries, arterioles and capillaries in diseases classified elsewhere: Secondary | ICD-10-CM | POA: Diagnosis not present

## 2012-08-06 DIAGNOSIS — F329 Major depressive disorder, single episode, unspecified: Secondary | ICD-10-CM | POA: Diagnosis not present

## 2012-08-06 DIAGNOSIS — F32A Depression, unspecified: Secondary | ICD-10-CM

## 2012-08-06 MED ORDER — SERTRALINE HCL 25 MG PO TABS: 25.0000 mg | ORAL_TABLET | Freq: Every day | ORAL | Status: DC

## 2012-08-06 MED ORDER — DIAZEPAM 5 MG PO TABS: 5.0000 mg | ORAL_TABLET | Freq: Every evening | ORAL | Status: DC | PRN

## 2012-08-06 NOTE — Assessment & Plan Note (Signed)
Symptoms of depression unchanged with Prozac. Patient is concerned that Prozac may be leading to elevated blood sugars. Will try changing to sertraline. Will continue counseling as scheduled. Followup in one month or sooner as needed.

## 2012-08-06 NOTE — Progress Notes (Signed)
Subjective:    Patient ID: [name] [name], male    DOB: February 12, 1956, 57 y.o.   MRN: [id number]  HPI  57 year old male with history of diabetes, coronary artery disease, peripheral vascular disease, hypertension, and depression presents for followup. At his last visit, he was concerned about worsening symptoms of depression. He noted depressed mood and decreased interest in normal activities. We started Prozac. He is also followed by local counselor. He reports that while on Prozac he has not had any improvement in symptoms. He feels that Prozac may be contributing to elevated blood sugars. He reports that blood sugars have been elevated even as high as 400. He reports compliance with his insulin. He denies any change in his food intake. Recent A1c was markedly elevated compared to previous at 10%.  Outpatient Encounter Prescriptions as of 08/06/2012  Medication Sig Dispense Refill  . buPROPion (WELLBUTRIN XL) 150 MG 24 hr tablet Take 1 tablet (150 mg total) by mouth daily.  30 tablet  6  . cholecalciferol (VITAMIN D) 1000 UNITS tablet Take 1,000 Units by mouth daily.        . clopidogrel (PLAVIX) 75 MG tablet Take 75 mg by mouth daily.      . cyclobenzaprine (FLEXERIL) 10 MG tablet Take 10 mg by mouth 2 (two) times daily as needed.       . diazepam (VALIUM) 2 MG tablet Take 1 tablet (2 mg total) by mouth 3 (three) times daily.  90 tablet  3  . diazepam (VALIUM) 5 MG tablet Take 1 tablet (5 mg total) by mouth at bedtime as needed for anxiety.  30 tablet  4  . DULoxetine (CYMBALTA) 60 MG capsule Take 1 capsule (60 mg total) by mouth daily.  30 capsule  6  . glucose blood test strip 1 each by Other route 3 (three) times daily as needed. Dx: 250.00      . Lactulose 20 GM/30ML SOLN Take 30 mLs by mouth every 2 (two) hours as needed.      [name] LANTUS SOLOSTAR 100 UNIT/ML injection INJECT UNDER THE SKIN 60 UNITS TWICE DAILY.  12 mL  6  . LIDODERM 5 %       . Liraglutide (VICTOZA) 18 MG/3ML SOLN Inject 0.2 mLs  (1.2 mg total) into the skin daily.  6 mL  3  . meclizine (ANTIVERT) 25 MG tablet Take 1 tablet (25 mg total) by mouth 3 (three) times daily as needed.  30 tablet  0  . metoprolol succinate (TOPROL-XL) 50 MG 24 hr tablet take 1 tablet by mouth twice a day  60 tablet  6  . Multiple Vitamin (MULTIVITAMIN) capsule Take 1 capsule by mouth daily.        . nitroGLYCERIN (NITROSTAT) 0.4 MG SL tablet Place 1 tablet (0.4 mg total) under the tongue every 5 (five) minutes as needed.  25 tablet  1  . NOVOFINE 32G X 6 MM MISC use once daily  100 each  3  . oxyCODONE (OXYCONTIN) 10 MG 12 hr tablet Take 1 tablet (10 mg total) by mouth every 12 (twelve) hours.  60 tablet  0  . oxyCODONE-acetaminophen (PERCOCET/ROXICET) 5-325 MG per tablet Take 2 tablets by mouth every 8 (eight) hours as needed.  90 tablet  0  . sertraline (ZOLOFT) 25 MG tablet Take 1 tablet (25 mg total) by mouth daily.  30 tablet  6  . simvastatin (ZOCOR) 20 MG tablet Take 1 tablet (20 mg total) by mouth at bedtime.  [place]  tablet  6   BP 132/80  Pulse 78  Temp 98.1 F (36.7 C) (Oral)  Ht 5' 8.5" (1.74 m)  Wt 276 lb 8 oz (125.42 kg)  BMI 41.43 kg/m2  SpO2 96%  Review of Systems  Constitutional: Negative for fever, chills, activity change, appetite change, fatigue and unexpected weight change.  Eyes: Negative for visual disturbance.  Respiratory: Negative for cough and shortness of breath.   Cardiovascular: Negative for chest pain, palpitations and leg swelling.  Gastrointestinal: Negative for abdominal pain and abdominal distention.  Genitourinary: Negative for dysuria, urgency and difficulty urinating.  Musculoskeletal: Negative for arthralgias and gait problem.  Skin: Negative for color change and rash.  Hematological: Negative for adenopathy.  Psychiatric/Behavioral: Positive for dysphoric mood. Negative for sleep disturbance. The patient is not nervous/anxious.        Objective:   Physical Exam  Constitutional: He is oriented to  person, place, and time. He appears well-developed and well-nourished. No distress.  HENT:  Head: Normocephalic and atraumatic.  Right Ear: External ear normal.  Left Ear: External ear normal.  Nose: Nose normal.  Mouth/Throat: Oropharynx is clear and moist. No oropharyngeal exudate.  Eyes: Conjunctivae normal and EOM are normal. Pupils are equal, round, and reactive to light. Right eye exhibits no discharge. Left eye exhibits no discharge. No scleral icterus.  Neck: Normal range of motion. Neck supple. No tracheal deviation present. No thyromegaly present.  Cardiovascular: Normal rate, regular rhythm and normal heart sounds.  Exam reveals no gallop and no friction rub.   No murmur heard. Pulmonary/Chest: Effort normal and breath sounds normal. No respiratory distress. He has no wheezes. He has no rales. He exhibits no tenderness.  Musculoskeletal: Normal range of motion. He exhibits no edema.  Lymphadenopathy:    He has no cervical adenopathy.  Neurological: He is alert and oriented to person, place, and time. No cranial nerve deficit. Coordination normal.  Skin: Skin is warm and dry. No rash noted. He is not diaphoretic. No erythema. No pallor.  Psychiatric: He has a normal mood and affect. His speech is normal and behavior is normal. Judgment and thought content normal. Cognition and memory are normal.          Assessment & Plan:

## 2012-08-06 NOTE — Patient Instructions (Signed)
1. Increase Lantus to 65units twice daily. 2. Write down everything that you eat, keep record and bring to next visit. 3. Stop Prozac and start Sertraline

## 2012-08-06 NOTE — Assessment & Plan Note (Signed)
Lab Results  Component Value Date   HGBA1C 10.3* 08/01/2012   Recent A1c was markedly elevated compared to previous. Patient denies any change in food intake however wife reported he has been binging on high carbohydrate foods in the setting of worsening depression. Encouraged him to keep a food diary. Will increase Lantus to 65 units twice daily. Follow up 1 month and prn.

## 2012-08-08 DIAGNOSIS — M79609 Pain in unspecified limb: Secondary | ICD-10-CM | POA: Diagnosis not present

## 2012-08-08 DIAGNOSIS — B351 Tinea unguium: Secondary | ICD-10-CM | POA: Diagnosis not present

## 2012-08-13 ENCOUNTER — Ambulatory Visit: Payer: Medicare Other | Admitting: Psychology

## 2012-08-20 ENCOUNTER — Ambulatory Visit (INDEPENDENT_AMBULATORY_CARE_PROVIDER_SITE_OTHER): Payer: Medicare Other | Admitting: Psychology

## 2012-08-20 DIAGNOSIS — F331 Major depressive disorder, recurrent, moderate: Secondary | ICD-10-CM | POA: Diagnosis not present

## 2012-08-20 DIAGNOSIS — F4310 Post-traumatic stress disorder, unspecified: Secondary | ICD-10-CM

## 2012-08-25 DIAGNOSIS — E119 Type 2 diabetes mellitus without complications: Secondary | ICD-10-CM | POA: Diagnosis not present

## 2012-08-25 DIAGNOSIS — I70219 Atherosclerosis of native arteries of extremities with intermittent claudication, unspecified extremity: Secondary | ICD-10-CM | POA: Diagnosis not present

## 2012-08-25 DIAGNOSIS — I6529 Occlusion and stenosis of unspecified carotid artery: Secondary | ICD-10-CM | POA: Diagnosis not present

## 2012-08-25 DIAGNOSIS — I10 Essential (primary) hypertension: Secondary | ICD-10-CM | POA: Diagnosis not present

## 2012-08-27 ENCOUNTER — Ambulatory Visit: Payer: Medicare Other | Admitting: Psychology

## 2012-08-28 ENCOUNTER — Other Ambulatory Visit: Payer: Self-pay | Admitting: Internal Medicine

## 2012-09-02 ENCOUNTER — Encounter: Payer: Self-pay | Admitting: Cardiovascular Disease

## 2012-09-02 ENCOUNTER — Ambulatory Visit (INDEPENDENT_AMBULATORY_CARE_PROVIDER_SITE_OTHER): Payer: Medicare Other | Admitting: Cardiovascular Disease

## 2012-09-02 VITALS — BP 128/80 | HR 81 | Ht 66.0 in | Wt 280.8 lb

## 2012-09-02 DIAGNOSIS — I10 Essential (primary) hypertension: Secondary | ICD-10-CM

## 2012-09-02 DIAGNOSIS — I350 Nonrheumatic aortic (valve) stenosis: Secondary | ICD-10-CM

## 2012-09-02 DIAGNOSIS — E785 Hyperlipidemia, unspecified: Secondary | ICD-10-CM | POA: Diagnosis not present

## 2012-09-02 DIAGNOSIS — I2510 Atherosclerotic heart disease of native coronary artery without angina pectoris: Secondary | ICD-10-CM | POA: Diagnosis not present

## 2012-09-02 DIAGNOSIS — I359 Nonrheumatic aortic valve disorder, unspecified: Secondary | ICD-10-CM

## 2012-09-02 DIAGNOSIS — I739 Peripheral vascular disease, unspecified: Secondary | ICD-10-CM | POA: Diagnosis not present

## 2012-09-02 NOTE — Progress Notes (Signed)
HPI  This is a 57 year old male who is here today for a followup visit. He has known history of coronary artery disease status post drug-eluting stent placements to the LAD in 2011. Repeat cardiac catheterization in 2012 showed significant restenosis with progression of three-vessel disease. He underwent coronary artery bypass graft surgery at that time. He also had left carotid endarterectomy at the same time. He has multiple medical conditions that include type 2 diabetes, hypertension, hyperlipidemia and peripheral arterial disease which is being followed by Dr. Ronalee Belts. He had recent left lower extremity arterial angioplasty. The patient has been overall stable from a cardiac standpoint. He denies any chest pain or significant dyspnea.  Allergies  Allergen Reactions  . Contrast Media (Iodinated Diagnostic Agents)     Got very hot and red  . Glipizide Other (See Comments)    ANTIDIABETICS. Burning  . Penicillins Hives and Swelling     Current Outpatient Prescriptions on File Prior to Visit  Medication Sig Dispense Refill  . BAYER MICROLET LANCETS lancets use three times a day  100 each  12  . buPROPion (WELLBUTRIN XL) 150 MG 24 hr tablet Take 1 tablet (150 mg total) by mouth daily.  30 tablet  6  . cholecalciferol (VITAMIN D) 1000 UNITS tablet Take 1,000 Units by mouth daily.        . clopidogrel (PLAVIX) 75 MG tablet Take 75 mg by mouth daily.      . cyclobenzaprine (FLEXERIL) 10 MG tablet Take 10 mg by mouth 2 (two) times daily as needed.       . diazepam (VALIUM) 2 MG tablet Take 1 tablet (2 mg total) by mouth 3 (three) times daily.  90 tablet  3  . diazepam (VALIUM) 5 MG tablet Take 1 tablet (5 mg total) by mouth at bedtime as needed for anxiety.  30 tablet  4  . glucose blood test strip 1 each by Other route 3 (three) times daily as needed. Dx: 250.00      . Lactulose 20 GM/30ML SOLN Take 30 mLs by mouth every 2 (two) hours as needed.      [name] LANTUS SOLOSTAR 100 UNIT/ML  injection INJECT UNDER THE SKIN 60 UNITS TWICE DAILY.  12 mL  6  . LIDODERM 5 %       . Liraglutide (VICTOZA) 18 MG/3ML SOLN Inject 0.2 mLs (1.2 mg total) into the skin daily.  6 mL  3  . metoprolol succinate (TOPROL-XL) 50 MG 24 hr tablet take 1 tablet by mouth twice a day  60 tablet  6  . Multiple Vitamin (MULTIVITAMIN) capsule Take 1 capsule by mouth daily.        . nitroGLYCERIN (NITROSTAT) 0.4 MG SL tablet Place 1 tablet (0.4 mg total) under the tongue every 5 (five) minutes as needed.  25 tablet  1  . NOVOFINE 32G X 6 MM MISC use once daily  100 each  3  . oxyCODONE (OXYCONTIN) 10 MG 12 hr tablet Take 1 tablet (10 mg total) by mouth every 12 (twelve) hours.  60 tablet  0  . oxyCODONE-acetaminophen (PERCOCET/ROXICET) 5-325 MG per tablet Take 2 tablets by mouth every 8 (eight) hours as needed.  90 tablet  0  . sertraline (ZOLOFT) 25 MG tablet Take 1 tablet (25 mg total) by mouth daily.  30 tablet  6  . simvastatin (ZOCOR) 20 MG tablet Take 1 tablet (20 mg total) by mouth at bedtime.  30 tablet  6   No  current facility-administered medications on file prior to visit.     Past Medical History  Diagnosis Date  . Peripheral vascular disease   . Diabetes mellitus   . Mild aortic stenosis   . Tobacco use disorder     recently quit  . Depression   . Psoriasis   . Neuropathy   . Obesity   . Post splenectomy syndrome   . SOB (shortness of breath)   . Carotid artery occlusion   . Coronary artery disease   . Hyperlipidemia   . Hypertension   . Bell's palsy 2007  . Sleep apnea   . Angina at rest     chronic  . Abnormal colonoscopy 06/2011    Dr. Minna Merritts, polyp     Past Surgical History  Procedure Laterality Date  . Splenectomy    . Heart stents  Jan 2011    leg stents 06/2009 and 03/2010  . Coronary artery bypass graft  09/06/2010    At [facility]: LIMA to LAD, left radial to RCA, sequential SVG to OM3 and 4  . Cardiac catheterization  08/2010    LAD: 80% ISR, RCA: 80% ostial, OM  80-90%  . Coronary angioplasty      LAD: before CABG  . Carotid endarterectomy  08/2010    left/ Dr. Kellie Simmering  . Pta of illiac and sfa  multiple    Dr. Ronalee Belts     Family History  Problem Relation Age of Onset  . Cancer Father 80  . Cancer Sister 8     History   Social History  . Marital Status: Married    Spouse Name: N/A    Number of Children: N/A  . Years of Education: N/A   Occupational History  . Not on file.   Social History Main Topics  . Smoking status: Current Some Day Smoker -- 0.30 packs/day for 40 years    Types: Cigarettes  . Smokeless tobacco: Never Used  . Alcohol Use: No  . Drug Use: No  . Sexually Active: Not on file   Other Topics Concern  . Not on file   Social History Narrative  . No narrative on file     PHYSICAL EXAM   BP 128/80  Pulse 81  Ht 5\' 6"  (1.676 m)  Wt 280 lb 12 oz (127.347 kg)  BMI 45.34 kg/m2 Constitutional: He is oriented to person, place, and time. He appears well-developed and well-nourished. No distress.  HENT: No nasal discharge.  Head: Normocephalic and atraumatic.  Eyes: Pupils are equal and round. Right eye exhibits no discharge. Left eye exhibits no discharge.  Neck: Normal range of motion. Neck supple. No JVD present. No thyromegaly present.  Cardiovascular: Normal rate, regular rhythm, diminished S2. Exam reveals no gallop and no friction rub. There is a 2/6 systolic ejection murmur at the aortic area. Pulmonary/Chest: Effort normal and breath sounds normal. No stridor. No respiratory distress. He has no wheezes. He has no rales. He exhibits no tenderness.  Abdominal: Soft. Bowel sounds are normal. He exhibits no distension. There is no tenderness. There is no rebound and no guarding.  Musculoskeletal: Normal range of motion. He exhibits trace edema and no tenderness.  Neurological: He is alert and oriented to person, place, and time. Coordination normal.  Skin: Skin is warm and dry. There is stasis dermatitis  in the lower extremities. He is not diaphoretic. No erythema. No pallor.  Psychiatric: He has a normal mood and affect. His behavior is normal. Judgment and thought  content normal.       EKG: Sinus  Rhythm  -  Nonspecific T-abnormality.   Low voltage -possible pulmonary disease.   ABNORMAL    ASSESSMENT AND PLAN

## 2012-09-02 NOTE — Assessment & Plan Note (Signed)
Most recent echocardiogram from last year showed mild stenosis with a mean gradient of 13 mm mercury. I recommend a followup echocardiogram in 2 years

## 2012-09-02 NOTE — Assessment & Plan Note (Signed)
His blood pressure is well controlled on current medications. 

## 2012-09-02 NOTE — Assessment & Plan Note (Signed)
He is stable overall with no symptoms suggestive of angina. Continue medical therapy.

## 2012-09-02 NOTE — Assessment & Plan Note (Signed)
Lab Results  Component Value Date   CHOL 98 04/25/2012   HDL 17.10* 04/25/2012   LDLCALC 50 04/25/2012   LDLDIRECT 47.6 11/16/2011   TRIG 157.0* 04/25/2012   CHOLHDL 6 04/25/2012   Continue treatment with simvastatin.

## 2012-09-02 NOTE — Patient Instructions (Addendum)
Continue same medications.  Follow up in 6 months.  

## 2012-09-03 ENCOUNTER — Ambulatory Visit (INDEPENDENT_AMBULATORY_CARE_PROVIDER_SITE_OTHER): Payer: Medicare Other | Admitting: Psychology

## 2012-09-03 ENCOUNTER — Ambulatory Visit (INDEPENDENT_AMBULATORY_CARE_PROVIDER_SITE_OTHER): Payer: Medicare Other | Admitting: Internal Medicine

## 2012-09-03 ENCOUNTER — Encounter: Payer: Self-pay | Admitting: Internal Medicine

## 2012-09-03 VITALS — BP 122/78 | HR 82 | Temp 98.1°F | Ht 68.5 in | Wt 280.5 lb

## 2012-09-03 DIAGNOSIS — I10 Essential (primary) hypertension: Secondary | ICD-10-CM | POA: Diagnosis not present

## 2012-09-03 DIAGNOSIS — F4310 Post-traumatic stress disorder, unspecified: Secondary | ICD-10-CM

## 2012-09-03 DIAGNOSIS — E1159 Type 2 diabetes mellitus with other circulatory complications: Secondary | ICD-10-CM

## 2012-09-03 DIAGNOSIS — F32A Depression, unspecified: Secondary | ICD-10-CM

## 2012-09-03 DIAGNOSIS — I798 Other disorders of arteries, arterioles and capillaries in diseases classified elsewhere: Secondary | ICD-10-CM

## 2012-09-03 DIAGNOSIS — F329 Major depressive disorder, single episode, unspecified: Secondary | ICD-10-CM | POA: Diagnosis not present

## 2012-09-03 DIAGNOSIS — F3289 Other specified depressive episodes: Secondary | ICD-10-CM

## 2012-09-03 NOTE — Progress Notes (Signed)
Subjective:    Patient ID: [name] [name], male    DOB: 1956-01-02, 57 y.o.   MRN: [id number]  HPI 57YO male with DM, HTN, CAD, PVD, Obesity, depression.   Depression - Symptoms have improved with starting sertraline.  Pt continues with counseling.  DM - BG typically 140-240.  Compliant with medications, however notes some dietary indiscretion. Does not exercise.  HTN,CAD - Compliant with meds, no chest pain, headache, palpitations.  Neuropathy - some persistent symptoms of burning in feet. Does not wish to add medication to help with symptoms at this point. No wounds on feet.  Outpatient Encounter Prescriptions as of 09/03/2012  Medication Sig Dispense Refill  . BAYER MICROLET LANCETS lancets use three times a day  100 each  12  . buPROPion (WELLBUTRIN XL) 150 MG 24 hr tablet Take 1 tablet (150 mg total) by mouth daily.  30 tablet  6  . cholecalciferol (VITAMIN D) 1000 UNITS tablet Take 1,000 Units by mouth daily.        . clopidogrel (PLAVIX) 75 MG tablet Take 75 mg by mouth daily.      . cyclobenzaprine (FLEXERIL) 10 MG tablet Take 10 mg by mouth 2 (two) times daily as needed.       . diazepam (VALIUM) 2 MG tablet Take 1 tablet (2 mg total) by mouth 3 (three) times daily.  90 tablet  3  . diazepam (VALIUM) 5 MG tablet Take 1 tablet (5 mg total) by mouth at bedtime as needed for anxiety.  30 tablet  4  . glucose blood test strip 1 each by Other route 3 (three) times daily as needed. Dx: 250.00      . Lactulose 20 GM/30ML SOLN Take 30 mLs by mouth every 2 (two) hours as needed.      [name] LANTUS SOLOSTAR 100 UNIT/ML injection INJECT UNDER THE SKIN 60 UNITS TWICE DAILY.  12 mL  6  . Liraglutide (VICTOZA) 18 MG/3ML SOLN Inject 0.2 mLs (1.2 mg total) into the skin daily.  6 mL  3  . metoprolol succinate (TOPROL-XL) 50 MG 24 hr tablet take 1 tablet by mouth twice a day  60 tablet  6  . Multiple Vitamin (MULTIVITAMIN) capsule Take 1 capsule by mouth daily.        . nitroGLYCERIN (NITROSTAT) 0.4  MG SL tablet Place 1 tablet (0.4 mg total) under the tongue every 5 (five) minutes as needed.  25 tablet  1  . NOVOFINE 32G X 6 MM MISC use once daily  100 each  3  . oxyCODONE (OXYCONTIN) 10 MG 12 hr tablet Take 10 mg by mouth every 12 (twelve) hours as needed.      [name] oxyCODONE-acetaminophen (PERCOCET/ROXICET) 5-325 MG per tablet Take 2 tablets by mouth every 8 (eight) hours as needed.  90 tablet  0  . sertraline (ZOLOFT) 25 MG tablet Take 1 tablet (25 mg total) by mouth daily.  30 tablet  6  . simvastatin (ZOCOR) 20 MG tablet Take 1 tablet (20 mg total) by mouth at bedtime.  30 tablet  6  . [DISCONTINUED] oxyCODONE (OXYCONTIN) 10 MG 12 hr tablet Take 1 tablet (10 mg total) by mouth every 12 (twelve) hours.  60 tablet  0  . LIDODERM 5 %        No facility-administered encounter medications on file as of 09/03/2012.   BP 122/78  Pulse 82  Temp(Src) 98.1 F (36.7 C) (Oral)  Ht 5' 8.5" (1.74 m)  Wt 280 lb 8  oz (127.234 kg)  BMI 42.02 kg/m2  SpO2 98%  Review of Systems  Constitutional: Negative for fever, chills, activity change, appetite change, fatigue and unexpected weight change.  Eyes: Negative for visual disturbance.  Respiratory: Negative for cough and shortness of breath.   Cardiovascular: Negative for chest pain, palpitations and leg swelling.  Gastrointestinal: Negative for abdominal pain and abdominal distention.  Genitourinary: Negative for dysuria, urgency and difficulty urinating.  Musculoskeletal: Positive for myalgias and back pain. Negative for arthralgias and gait problem.  Skin: Negative for color change and rash.  Hematological: Negative for adenopathy.  Psychiatric/Behavioral: Negative for sleep disturbance and dysphoric mood. The patient is not nervous/anxious.        Objective:   Physical Exam  Constitutional: He is oriented to person, place, and time. He appears well-developed and well-nourished. No distress.  HENT:  Head: Normocephalic and atraumatic.   Right Ear: External ear normal.  Left Ear: External ear normal.  Nose: Nose normal.  Mouth/Throat: Oropharynx is clear and moist. No oropharyngeal exudate.  Eyes: Conjunctivae and EOM are normal. Pupils are equal, round, and reactive to light. Right eye exhibits no discharge. Left eye exhibits no discharge. No scleral icterus.  Neck: Normal range of motion. Neck supple. No tracheal deviation present. No thyromegaly present.  Cardiovascular: Normal rate, regular rhythm and normal heart sounds.  Exam reveals no gallop and no friction rub.   No murmur heard. Pulmonary/Chest: Effort normal and breath sounds normal. No respiratory distress. He has no wheezes. He has no rales. He exhibits no tenderness.  Musculoskeletal: Normal range of motion. He exhibits no edema.  Lymphadenopathy:    He has no cervical adenopathy.  Neurological: He is alert and oriented to person, place, and time. No cranial nerve deficit. Coordination normal.  Skin: Skin is warm and dry. No rash noted. He is not diaphoretic. No erythema. No pallor.  Psychiatric: He has a normal mood and affect. His behavior is normal. Judgment and thought content normal.          Assessment & Plan:

## 2012-09-03 NOTE — Patient Instructions (Signed)
1200 Calorie Diabetic Diet The 1200 calorie diabetic diet limits calories to 1200 each day. Following this diet and making healthy meal choices can help improve overall health. It controls blood glucose (sugar) levels and can also help lower blood pressure and cholesterol.  SERVING SIZES Measuring foods and serving sizes helps to make sure you are getting the right amount of food. The list below tells how big or small some common serving sizes are.   1 oz.........4 stacked dice.  3 oz........Marland KitchenDeck of cards.  1 tsp.......Marland KitchenTip of little finger.  1 tbs......Marland KitchenMarland KitchenThumb.  2 tbs.......Marland KitchenGolf ball.   cup......Marland KitchenHalf of a fist.  1 cup.......Marland KitchenA fist. GUIDELINES FOR CHOOSING FOODS The goal of this diet is to eat a variety of foods and limit calories to 1200 each day. This can be done by choosing foods that are low in calories and fat. The diet also suggests eating small amounts of food frequently. Doing this helps control your blood glucose levels, so they do not get too high or too low. Each meal or snack may include a protein food source to help you feel more satisfied. Try to eat about the same amount of food around the same time each day. This includes weekend days, travel days, and days off work. Space your meals about 4 to 5 hours apart, and add a snack between them, if you wish.  For example, a daily food plan could include breakfast, a morning snack, lunch, dinner, and an evening snack. Healthy meals and snacks have different types of foods, including whole grains, vegetables, fruits, lean meats, poultry, fish, and dairy products. As you plan your meals, select a variety of foods. Choose from the bread and starch, vegetable, fruit, dairy, and meat/protein groups. Examples of foods from each group are listed below, with their suggested serving sizes. Use measuring cups and spoons to become familiar with what a healthy portion looks like. Bread and Starch Each serving equals 15 grams of  carbohydrate.  1 slice bread.   bagel.   cup cold cereal (unsweetened).   cup hot cereal or mashed potatoes.  1 small potato (size of a computer mouse).   cup cooked pasta or rice.   English muffin.  1 cup broth-based soup.  3 cups of popcorn.  4 to 6 whole-wheat crackers.   cup cooked beans, peas, or corn. Vegetables Each serving equals 5 grams of carbohydrate.   cup cooked vegetables.  1 cup raw vegetables.   cup tomato or vegetable juice. Fruit Each serving equals 15 grams of carbohydrate.  1 small apple or orange.  1  cup watermelon or strawberries.   cup applesauce (no sugar added).  2 tbs raisins.   banana.   cup canned fruit, packed in water or in its own juice.   cup unsweetened fruit juice. Dairy Each serving equals 12 to 15 grams of carbohydrate.  1 cup fat-free milk.  6 oz artificially sweetened yogurt or plain yogurt.  1 cup low-fat buttermilk.  1 cup soy milk.  1 cup almond milk. Meat/Protein  1 large egg.  2 to 3 oz meat, poultry, or fish.   cup low-fat cottage cheese.  1 tbs peanut butter.  1 oz low-fat cheese.   cup tuna, packed in water.   cup tofu. Fat  1 tsp oil.  1 tsp trans-fat-free margarine.  1 tsp butter.  1 tsp mayonnaise.  2 tbs avocado.  1 tbs salad dressing.  1 tbs cream cheese.  2 tbs sour cream. SAMPLE 1200 CALORIE  DIET PLAN Breakfast  1 cup fat-free milk (1 carb serving).  1 small orange (1 carb serving).  1 scrambled egg.  1 slice whole-wheat toast (1 carb serving). Lunch  Sandwich  2 slices whole-wheat bread (2 carb servings).  2 oz lean meat.  2 tsp reduced fat mayonnaise.  1 lettuce leaf.  2 slices tomato.  1 cup carrot sticks. Afternoon Snack  1 small apple (1 carb serving).  1 string cheese. Dinner  2 oz meat.  1 small baked potato (1 carb serving).  1 tsp trans-fat-free margarine.  1 cup steamed broccoli.  1 cup fat-free milk (1 carb  serving). Evening Snack   small banana (1 carb serving).  6 vanilla wafers (1 carb serving). MEAL PLAN You can use this worksheet to help you make a daily meal plan based on the 1200 calorie diabetic diet suggestions. If you are using this plan to help you control your blood glucose, you may interchange carbohydrate containing foods (dairy, starches, and fruits). Select a variety of fresh foods of varying colors and flavors. The total amount of carbohydrate in your meals or snacks is more important than making sure you include all of the food groups every time you eat. You can choose from approximately this many of the following foods to build your day's meals:  6 Starches.  3 Vegetables.  2 Fruits.  2 Dairy.  4 to 6 oz Meat/Protein.  Up to 3 Fats. Your dietician can use this worksheet to help you decide how many servings and which types of foods are right for you. BREAKFAST Food Group and Servings / Food Choice Starch _________________________________________________________ Dairy __________________________________________________________ Fruit ___________________________________________________________ Meat/Protein____________________________________________________ Fat ____________________________________________________________ LUNCH Food Group and Servings / Food Choice  Starch _________________________________________________________ Meat/Protein ___________________________________________________ Vegetables _____________________________________________________ Fruit __________________________________________________________ Dairy __________________________________________________________ Fat ____________________________________________________________ [name] [name] Food Group and Servings / Food Choice Dairy __________________________________________________________ Fruit ___________________________________________________________ Starch  __________________________________________________________ Meat/Protein____________________________________________________ [name] [name] Food Group and Servings / Food Choice Starch _________________________________________________________ Meat/Protein ___________________________________________________ Dairy __________________________________________________________ Vegetables _____________________________________________________ Fruit __________________________________________________________ Fat ____________________________________________________________ [name] [name] Food Group and Servings / Food Choice Fruit ___________________________________________________________ Meat/Protein ____________________________________________________ Dairy __________________________________________________________ Starch __________________________________________________________ DAILY TOTALS Starches _________________________ Vegetables _______________________ Fruits ____________________________ Dairy ____________________________ Meat/Protein______________________ Fats _____________________________ Document Released: 01/29/2005 Document Revised: 10/01/2011 Document Reviewed: 05/26/2009 [employer] Patient Information 2013 [place], [place].

## 2012-09-03 NOTE — Assessment & Plan Note (Signed)
BP Readings from Last 3 Encounters:  09/03/12 122/78  09/02/12 128/80  08/06/12 132/80    BP well controlled on current medication. Will continue.

## 2012-09-03 NOTE — Assessment & Plan Note (Signed)
Pt reports BG have been elevated. Encouraged better compliance with diet low in processed carbohydrates. Will set up nutritional counseling with local pharmacy. Follow up with A1c in 10/2012.

## 2012-09-03 NOTE — Assessment & Plan Note (Signed)
Symptoms of depression improved with sertraline. Will continue. Continue counseling.

## 2012-09-10 ENCOUNTER — Ambulatory Visit (INDEPENDENT_AMBULATORY_CARE_PROVIDER_SITE_OTHER): Payer: Medicare Other | Admitting: Psychology

## 2012-09-10 DIAGNOSIS — F4310 Post-traumatic stress disorder, unspecified: Secondary | ICD-10-CM | POA: Diagnosis not present

## 2012-09-10 DIAGNOSIS — F331 Major depressive disorder, recurrent, moderate: Secondary | ICD-10-CM | POA: Diagnosis not present

## 2012-09-16 ENCOUNTER — Telehealth: Payer: Self-pay | Admitting: *Deleted

## 2012-09-16 NOTE — Telephone Encounter (Signed)
Patient left message on voicemail requesting samples for Vytoza

## 2012-09-17 ENCOUNTER — Ambulatory Visit (INDEPENDENT_AMBULATORY_CARE_PROVIDER_SITE_OTHER): Payer: Medicare Other | Admitting: Psychology

## 2012-09-17 DIAGNOSIS — F331 Major depressive disorder, recurrent, moderate: Secondary | ICD-10-CM

## 2012-09-17 DIAGNOSIS — F4310 Post-traumatic stress disorder, unspecified: Secondary | ICD-10-CM | POA: Diagnosis not present

## 2012-09-23 ENCOUNTER — Other Ambulatory Visit: Payer: Self-pay | Admitting: *Deleted

## 2012-09-23 MED ORDER — CLOPIDOGREL BISULFATE 75 MG PO TABS: 75.0000 mg | ORAL_TABLET | Freq: Every day | ORAL | Status: DC

## 2012-09-24 ENCOUNTER — Ambulatory Visit (INDEPENDENT_AMBULATORY_CARE_PROVIDER_SITE_OTHER): Payer: Medicare Other | Admitting: Psychology

## 2012-09-24 DIAGNOSIS — F4310 Post-traumatic stress disorder, unspecified: Secondary | ICD-10-CM

## 2012-10-01 ENCOUNTER — Ambulatory Visit (INDEPENDENT_AMBULATORY_CARE_PROVIDER_SITE_OTHER): Payer: Medicare Other | Admitting: Psychology

## 2012-10-01 DIAGNOSIS — F331 Major depressive disorder, recurrent, moderate: Secondary | ICD-10-CM | POA: Diagnosis not present

## 2012-10-01 DIAGNOSIS — F4310 Post-traumatic stress disorder, unspecified: Secondary | ICD-10-CM

## 2012-10-03 ENCOUNTER — Telehealth: Payer: Self-pay | Admitting: *Deleted

## 2012-10-03 NOTE — Telephone Encounter (Signed)
Called Sharyn Lull but she has left for the day, will try back on Monday

## 2012-10-03 NOTE — Telephone Encounter (Signed)
Stann Mainland from [facility] in [place] left message requesting patient information and to return her call.

## 2012-10-08 ENCOUNTER — Ambulatory Visit (INDEPENDENT_AMBULATORY_CARE_PROVIDER_SITE_OTHER): Payer: Medicare Other | Admitting: Psychology

## 2012-10-08 DIAGNOSIS — F4310 Post-traumatic stress disorder, unspecified: Secondary | ICD-10-CM | POA: Diagnosis not present

## 2012-10-08 DIAGNOSIS — F331 Major depressive disorder, recurrent, moderate: Secondary | ICD-10-CM | POA: Diagnosis not present

## 2012-10-08 NOTE — Telephone Encounter (Signed)
FYI Spoke with Sharyn Lull, patient needs to have his PSA checked when he comes in for his visit in April. Please fax results to [facility] [phone]

## 2012-10-09 ENCOUNTER — Telehealth: Payer: Self-pay | Admitting: Internal Medicine

## 2012-10-09 NOTE — Telephone Encounter (Signed)
Ok for samples?

## 2012-10-09 NOTE — Telephone Encounter (Signed)
Needing samples of Victoza.

## 2012-10-09 NOTE — Telephone Encounter (Signed)
Sure. [name] [name] can give him 2 boxes.

## 2012-10-10 NOTE — Telephone Encounter (Signed)
Called patient and he will come by to pick it up

## 2012-10-15 ENCOUNTER — Ambulatory Visit: Payer: Medicare Other | Admitting: Psychology

## 2012-10-16 DIAGNOSIS — E119 Type 2 diabetes mellitus without complications: Secondary | ICD-10-CM | POA: Diagnosis not present

## 2012-10-22 ENCOUNTER — Ambulatory Visit (INDEPENDENT_AMBULATORY_CARE_PROVIDER_SITE_OTHER): Payer: Medicare Other | Admitting: Psychology

## 2012-10-22 DIAGNOSIS — F331 Major depressive disorder, recurrent, moderate: Secondary | ICD-10-CM

## 2012-10-22 DIAGNOSIS — F4310 Post-traumatic stress disorder, unspecified: Secondary | ICD-10-CM

## 2012-10-23 DIAGNOSIS — E119 Type 2 diabetes mellitus without complications: Secondary | ICD-10-CM | POA: Diagnosis not present

## 2012-10-29 ENCOUNTER — Ambulatory Visit (INDEPENDENT_AMBULATORY_CARE_PROVIDER_SITE_OTHER): Payer: Medicare Other | Admitting: Psychology

## 2012-10-29 DIAGNOSIS — F4310 Post-traumatic stress disorder, unspecified: Secondary | ICD-10-CM | POA: Diagnosis not present

## 2012-10-29 DIAGNOSIS — F331 Major depressive disorder, recurrent, moderate: Secondary | ICD-10-CM

## 2012-10-30 DIAGNOSIS — E119 Type 2 diabetes mellitus without complications: Secondary | ICD-10-CM | POA: Diagnosis not present

## 2012-11-04 ENCOUNTER — Ambulatory Visit (INDEPENDENT_AMBULATORY_CARE_PROVIDER_SITE_OTHER): Payer: Medicare Other | Admitting: Internal Medicine

## 2012-11-04 ENCOUNTER — Encounter: Payer: Self-pay | Admitting: Internal Medicine

## 2012-11-04 VITALS — BP 132/80 | HR 86 | Temp 98.3°F | Wt 277.0 lb

## 2012-11-04 DIAGNOSIS — F329 Major depressive disorder, single episode, unspecified: Secondary | ICD-10-CM

## 2012-11-04 DIAGNOSIS — F419 Anxiety disorder, unspecified: Secondary | ICD-10-CM

## 2012-11-04 DIAGNOSIS — G589 Mononeuropathy, unspecified: Secondary | ICD-10-CM

## 2012-11-04 DIAGNOSIS — F411 Generalized anxiety disorder: Secondary | ICD-10-CM

## 2012-11-04 DIAGNOSIS — E1149 Type 2 diabetes mellitus with other diabetic neurological complication: Secondary | ICD-10-CM

## 2012-11-04 DIAGNOSIS — F32A Depression, unspecified: Secondary | ICD-10-CM

## 2012-11-04 DIAGNOSIS — N400 Enlarged prostate without lower urinary tract symptoms: Secondary | ICD-10-CM | POA: Diagnosis not present

## 2012-11-04 DIAGNOSIS — E1159 Type 2 diabetes mellitus with other circulatory complications: Secondary | ICD-10-CM | POA: Diagnosis not present

## 2012-11-04 DIAGNOSIS — I798 Other disorders of arteries, arterioles and capillaries in diseases classified elsewhere: Secondary | ICD-10-CM

## 2012-11-04 DIAGNOSIS — E1142 Type 2 diabetes mellitus with diabetic polyneuropathy: Secondary | ICD-10-CM

## 2012-11-04 DIAGNOSIS — G629 Polyneuropathy, unspecified: Secondary | ICD-10-CM

## 2012-11-04 DIAGNOSIS — E1140 Type 2 diabetes mellitus with diabetic neuropathy, unspecified: Secondary | ICD-10-CM

## 2012-11-04 DIAGNOSIS — E785 Hyperlipidemia, unspecified: Secondary | ICD-10-CM

## 2012-11-04 DIAGNOSIS — I10 Essential (primary) hypertension: Secondary | ICD-10-CM

## 2012-11-04 LAB — COMPREHENSIVE METABOLIC PANEL
ALT: 20 U/L (ref 0–53)
AST: 19 U/L (ref 0–37)
Albumin: 3.7 g/dL (ref 3.5–5.2)
Alkaline Phosphatase: 116 U/L (ref 39–117)
BUN: 18 mg/dL (ref 6–23)
CO2: 23 mEq/L (ref 19–32)
Calcium: 8.8 mg/dL (ref 8.4–10.5)
Chloride: 101 mEq/L (ref 96–112)
Creatinine, Ser: 1.4 mg/dL (ref 0.4–1.5)
GFR: 55.11 mL/min — ABNORMAL LOW (ref >60.00)
Glucose, Bld: 135 mg/dL — ABNORMAL HIGH (ref 70–99)
Potassium: 4.4 mEq/L (ref 3.5–5.1)
Sodium: 136 mEq/L (ref 135–145)
Total Bilirubin: 0.3 mg/dL (ref 0.3–1.2)
Total Protein: 8 g/dL (ref 6.0–8.3)

## 2012-11-04 LAB — LIPID PANEL
Cholesterol: 93 mg/dL (ref 0–200)
HDL: 13.4 mg/dL — ABNORMAL LOW (ref >39.00)
Total CHOL/HDL Ratio: 7
Triglycerides: 228 mg/dL — ABNORMAL HIGH (ref 0.0–149.0)
VLDL: 45.6 mg/dL — ABNORMAL HIGH (ref 0.0–40.0)

## 2012-11-04 LAB — LDL CHOLESTEROL, DIRECT: Direct LDL: 45.5 mg/dL

## 2012-11-04 LAB — HEMOGLOBIN A1C: Hgb A1c MFr Bld: 8.6 % — ABNORMAL HIGH (ref 4.6–6.5)

## 2012-11-04 MED ORDER — GLUCOSE BLOOD VI STRP: ORAL_STRIP | Status: DC

## 2012-11-04 MED ORDER — DIAZEPAM 5 MG PO TABS: 5.0000 mg | ORAL_TABLET | Freq: Every evening | ORAL | Status: DC | PRN

## 2012-11-04 MED ORDER — GABAPENTIN 100 MG PO CAPS: 100.0000 mg | ORAL_CAPSULE | Freq: Three times a day (TID) | ORAL | Status: DC

## 2012-11-04 MED ORDER — LANCET DEVICE MISC: 1.0000 "application " | Status: DC | PRN

## 2012-11-04 MED ORDER — DIAZEPAM 2 MG PO TABS: 2.0000 mg | ORAL_TABLET | Freq: Three times a day (TID) | ORAL | Status: DC

## 2012-11-04 NOTE — Assessment & Plan Note (Signed)
BP Readings from Last 3 Encounters:  11/04/12 132/80  09/03/12 122/78  09/02/12 128/80   Blood pressure well-controlled on current medications. Will continue.

## 2012-11-04 NOTE — Assessment & Plan Note (Signed)
Lab Results  Component Value Date   LDLCALC 50 04/25/2012   Lipids well-controlled on simvastatin. We'll continue.

## 2012-11-04 NOTE — Assessment & Plan Note (Signed)
Symptoms consistent with diabetic neuropathy. Will try adding Neurontin 100 mg up to 3 times daily. We discussed that we may need to titrate the dose of this medication. Followup in 3 months or sooner as needed.

## 2012-11-04 NOTE — Progress Notes (Signed)
Subjective:    Patient ID: [name] [name], male    DOB: 06-May-1956, 57 y.o.   MRN: [id number]  HPI 57 year old male with history of obesity, diabetes, coronary artery disease, hypertension, hyperlipidemia, depression presents for followup. In regards to diabetes, he recently completed diabetes education course. He reports significant improvement in his blood sugars with changes that he has made to his diet. He is also trying to exercise by walking on a regular basis. He has lost 2 pounds since his last visit. He is compliant with his medications.   In regards to depression, he reports that symptoms have been much improved with use of sertraline. He continues with counseling as well.   He is concerned about ongoing burning pain on the soles of his feet. The pain is fairly constant. It does not radiate. This is been going on for several months. In the past, he took Lyrica but was unable to tolerate this medication. He is not currently taking any medication for this.  Outpatient Encounter Prescriptions as of 11/04/2012  Medication Sig Dispense Refill  . BAYER MICROLET LANCETS lancets use three times a day  100 each  12  . buPROPion (WELLBUTRIN XL) 150 MG 24 hr tablet Take 1 tablet (150 mg total) by mouth daily.  30 tablet  6  . cholecalciferol (VITAMIN D) 1000 UNITS tablet Take 1,000 Units by mouth daily.        . clopidogrel (PLAVIX) 75 MG tablet Take 1 tablet (75 mg total) by mouth daily.  90 tablet  4  . cyclobenzaprine (FLEXERIL) 10 MG tablet Take 10 mg by mouth 2 (two) times daily as needed.       . diazepam (VALIUM) 2 MG tablet Take 1 tablet (2 mg total) by mouth 3 (three) times daily.  90 tablet  3  . diazepam (VALIUM) 5 MG tablet Take 1 tablet (5 mg total) by mouth at bedtime as needed for anxiety.  30 tablet  4  . glucose blood test strip Use as directed  100 each  11  . Lactulose 20 GM/30ML SOLN Take 30 mLs by mouth every 2 (two) hours as needed.      [name] LANTUS SOLOSTAR 100 UNIT/ML  injection INJECT UNDER THE SKIN 60 UNITS TWICE DAILY.  12 mL  6  . LIDODERM 5 %       . Liraglutide (VICTOZA) 18 MG/3ML SOLN Inject 0.2 mLs (1.2 mg total) into the skin daily.  6 mL  3  . metoprolol succinate (TOPROL-XL) 50 MG 24 hr tablet take 1 tablet by mouth twice a day  60 tablet  6  . Multiple Vitamin (MULTIVITAMIN) capsule Take 1 capsule by mouth daily.        . nitroGLYCERIN (NITROSTAT) 0.4 MG SL tablet Place 1 tablet (0.4 mg total) under the tongue every 5 (five) minutes as needed.  25 tablet  1  . NOVOFINE 32G X 6 MM MISC use once daily  100 each  3  . oxyCODONE (OXYCONTIN) 10 MG 12 hr tablet Take 10 mg by mouth every 12 (twelve) hours as needed.      . sertraline (ZOLOFT) 25 MG tablet Take 1 tablet (25 mg total) by mouth daily.  30 tablet  6  . simvastatin (ZOCOR) 20 MG tablet Take 1 tablet (20 mg total) by mouth at bedtime.  30 tablet  6  . tamsulosin (FLOMAX) 0.4 MG CAPS       . gabapentin (NEURONTIN) 100 MG capsule Take 1 capsule (100 mg  total) by mouth 3 (three) times daily.  90 capsule  3  . Lancet Device MISC 1 application by Does not apply route as needed.  100 each  11  . oxyCODONE-acetaminophen (PERCOCET/ROXICET) 5-325 MG per tablet Take 2 tablets by mouth every 8 (eight) hours as needed.  90 tablet  0   No facility-administered encounter medications on file as of 11/04/2012.   BP 132/80  Pulse 86  Temp(Src) 98.3 F (36.8 C) (Oral)  Wt 277 lb (125.646 kg)  BMI 41.5 kg/m2  SpO2 95%  Review of Systems  Constitutional: Negative for fever, chills, activity change, appetite change, fatigue and unexpected weight change.  Eyes: Negative for visual disturbance.  Respiratory: Negative for cough and shortness of breath.   Cardiovascular: Negative for chest pain, palpitations and leg swelling.  Gastrointestinal: Negative for abdominal pain and abdominal distention.  Genitourinary: Negative for dysuria, urgency and difficulty urinating.  Musculoskeletal: Negative for  arthralgias and gait problem.  Skin: Negative for color change and rash.  Hematological: Negative for adenopathy.  Psychiatric/Behavioral: Negative for sleep disturbance and dysphoric mood. The patient is not nervous/anxious.        Objective:   Physical Exam  Constitutional: He is oriented to person, place, and time. He appears well-developed and well-nourished. No distress.  HENT:  Head: Normocephalic and atraumatic.  Right Ear: External ear normal.  Left Ear: External ear normal.  Nose: Nose normal.  Mouth/Throat: Oropharynx is clear and moist. No oropharyngeal exudate.  Eyes: Conjunctivae and EOM are normal. Pupils are equal, round, and reactive to light. Right eye exhibits no discharge. Left eye exhibits no discharge. No scleral icterus.  Neck: Normal range of motion. Neck supple. No tracheal deviation present. No thyromegaly present.  Cardiovascular: Normal rate and regular rhythm.  Exam reveals no gallop and no friction rub.   Murmur heard. Pulmonary/Chest: Effort normal and breath sounds normal. No respiratory distress. He has no wheezes. He has no rales. He exhibits no tenderness.  Musculoskeletal: Normal range of motion. He exhibits no edema.  Lymphadenopathy:    He has no cervical adenopathy.  Neurological: He is alert and oriented to person, place, and time. No cranial nerve deficit. Coordination normal.  Skin: Skin is warm and dry. No rash noted. He is not diaphoretic. No erythema. No pallor.  Psychiatric: He has a normal mood and affect. His behavior is normal. Judgment and thought content normal.          Assessment & Plan:

## 2012-11-04 NOTE — Assessment & Plan Note (Signed)
Symptoms markedly improved with use of sertraline. Will continue.

## 2012-11-04 NOTE — Assessment & Plan Note (Signed)
Lab Results  Component Value Date   HGBA1C 8.6* 11/04/2012   BG markedly improved after completing diabetes education course. We'll continue current medications. Encourage continued efforts at healthy diet and regular physical activity. Followup in 3 months or sooner as needed.

## 2012-11-05 ENCOUNTER — Ambulatory Visit (INDEPENDENT_AMBULATORY_CARE_PROVIDER_SITE_OTHER): Payer: Medicare Other | Admitting: Psychology

## 2012-11-05 DIAGNOSIS — F4310 Post-traumatic stress disorder, unspecified: Secondary | ICD-10-CM | POA: Diagnosis not present

## 2012-11-05 DIAGNOSIS — F331 Major depressive disorder, recurrent, moderate: Secondary | ICD-10-CM | POA: Diagnosis not present

## 2012-11-05 LAB — PSA, TOTAL AND FREE
PSA, Free Pct: 42 % (ref >25)
PSA, Free: <0.1 ng/mL
PSA: 0.19 ng/mL (ref <=4.00)

## 2012-11-07 DIAGNOSIS — M79609 Pain in unspecified limb: Secondary | ICD-10-CM | POA: Diagnosis not present

## 2012-11-07 DIAGNOSIS — B351 Tinea unguium: Secondary | ICD-10-CM | POA: Diagnosis not present

## 2012-11-10 ENCOUNTER — Telehealth: Payer: Self-pay | Admitting: *Deleted

## 2012-11-10 NOTE — Telephone Encounter (Signed)
We can give him 2-3 boxes of the Victoza.

## 2012-11-10 NOTE — Telephone Encounter (Signed)
Pt left VM, requesting samples of Victoza. Requests call back. Please advise.

## 2012-11-10 NOTE — Telephone Encounter (Signed)
Notified pt samples available for pickup, will come by 11/12/12.

## 2012-11-11 ENCOUNTER — Encounter: Payer: Self-pay | Admitting: Internal Medicine

## 2012-11-12 ENCOUNTER — Ambulatory Visit (INDEPENDENT_AMBULATORY_CARE_PROVIDER_SITE_OTHER): Payer: Medicare Other | Admitting: Psychology

## 2012-11-12 DIAGNOSIS — F4310 Post-traumatic stress disorder, unspecified: Secondary | ICD-10-CM

## 2012-11-12 DIAGNOSIS — F331 Major depressive disorder, recurrent, moderate: Secondary | ICD-10-CM

## 2012-11-19 ENCOUNTER — Ambulatory Visit (INDEPENDENT_AMBULATORY_CARE_PROVIDER_SITE_OTHER): Payer: Medicare Other | Admitting: Psychology

## 2012-11-19 DIAGNOSIS — F4310 Post-traumatic stress disorder, unspecified: Secondary | ICD-10-CM | POA: Diagnosis not present

## 2012-11-19 DIAGNOSIS — F331 Major depressive disorder, recurrent, moderate: Secondary | ICD-10-CM

## 2012-11-20 DIAGNOSIS — R3915 Urgency of urination: Secondary | ICD-10-CM | POA: Diagnosis not present

## 2012-11-20 DIAGNOSIS — R3911 Hesitancy of micturition: Secondary | ICD-10-CM | POA: Diagnosis not present

## 2012-11-20 DIAGNOSIS — R39198 Other difficulties with micturition: Secondary | ICD-10-CM | POA: Diagnosis not present

## 2012-11-21 ENCOUNTER — Other Ambulatory Visit: Payer: Self-pay | Admitting: *Deleted

## 2012-11-21 DIAGNOSIS — E785 Hyperlipidemia, unspecified: Secondary | ICD-10-CM

## 2012-11-21 MED ORDER — SIMVASTATIN 20 MG PO TABS: 20.0000 mg | ORAL_TABLET | Freq: Every day | ORAL | Status: DC

## 2012-11-21 NOTE — Telephone Encounter (Signed)
Simvastatin escribed

## 2012-11-26 ENCOUNTER — Ambulatory Visit: Payer: Medicare Other | Admitting: Psychology

## 2012-11-26 DIAGNOSIS — F17200 Nicotine dependence, unspecified, uncomplicated: Secondary | ICD-10-CM | POA: Diagnosis not present

## 2012-11-26 DIAGNOSIS — M79609 Pain in unspecified limb: Secondary | ICD-10-CM | POA: Diagnosis not present

## 2012-11-26 DIAGNOSIS — I6529 Occlusion and stenosis of unspecified carotid artery: Secondary | ICD-10-CM | POA: Diagnosis not present

## 2012-11-26 DIAGNOSIS — I739 Peripheral vascular disease, unspecified: Secondary | ICD-10-CM | POA: Diagnosis not present

## 2012-11-26 DIAGNOSIS — I70219 Atherosclerosis of native arteries of extremities with intermittent claudication, unspecified extremity: Secondary | ICD-10-CM | POA: Diagnosis not present

## 2012-12-02 ENCOUNTER — Ambulatory Visit: Payer: Self-pay | Admitting: Vascular Surgery

## 2012-12-02 DIAGNOSIS — I70229 Atherosclerosis of native arteries of extremities with rest pain, unspecified extremity: Secondary | ICD-10-CM | POA: Diagnosis not present

## 2012-12-02 DIAGNOSIS — I7092 Chronic total occlusion of artery of the extremities: Secondary | ICD-10-CM | POA: Diagnosis not present

## 2012-12-02 LAB — BASIC METABOLIC PANEL
Anion Gap: 6 — ABNORMAL LOW (ref 7–16)
BUN: 22 mg/dL — ABNORMAL HIGH (ref 7–18)
Calcium, Total: 9.1 mg/dL (ref 8.5–10.1)
Chloride: 101 mmol/L (ref 98–107)
Co2: 25 mmol/L (ref 21–32)
Creatinine: 1.48 mg/dL — ABNORMAL HIGH (ref 0.60–1.30)
EGFR (African American): 59 — ABNORMAL LOW
EGFR (Non-African Amer.): 51 — ABNORMAL LOW
Glucose: 423 mg/dL — ABNORMAL HIGH (ref 65–99)
Osmolality: 286 (ref 275–301)
Potassium: 5.1 mmol/L (ref 3.5–5.1)
Sodium: 132 mmol/L — ABNORMAL LOW (ref 136–145)

## 2012-12-10 ENCOUNTER — Telehealth: Payer: Self-pay | Admitting: Internal Medicine

## 2012-12-10 ENCOUNTER — Ambulatory Visit: Payer: Medicare Other | Admitting: Psychology

## 2012-12-10 NOTE — Telephone Encounter (Signed)
Pt called to see if we have any samples of victozia

## 2012-12-10 NOTE — Telephone Encounter (Signed)
Samples left at front desk for pick up.

## 2012-12-17 ENCOUNTER — Ambulatory Visit (INDEPENDENT_AMBULATORY_CARE_PROVIDER_SITE_OTHER): Payer: Medicare Other | Admitting: Psychology

## 2012-12-17 DIAGNOSIS — F331 Major depressive disorder, recurrent, moderate: Secondary | ICD-10-CM | POA: Diagnosis not present

## 2012-12-17 DIAGNOSIS — F4310 Post-traumatic stress disorder, unspecified: Secondary | ICD-10-CM

## 2012-12-17 DIAGNOSIS — E119 Type 2 diabetes mellitus without complications: Secondary | ICD-10-CM | POA: Diagnosis not present

## 2012-12-24 ENCOUNTER — Ambulatory Visit (INDEPENDENT_AMBULATORY_CARE_PROVIDER_SITE_OTHER): Payer: Medicare Other | Admitting: Psychology

## 2012-12-24 DIAGNOSIS — F4310 Post-traumatic stress disorder, unspecified: Secondary | ICD-10-CM | POA: Diagnosis not present

## 2012-12-24 DIAGNOSIS — F331 Major depressive disorder, recurrent, moderate: Secondary | ICD-10-CM

## 2012-12-30 ENCOUNTER — Encounter: Payer: Self-pay | Admitting: Internal Medicine

## 2012-12-30 ENCOUNTER — Ambulatory Visit (INDEPENDENT_AMBULATORY_CARE_PROVIDER_SITE_OTHER): Payer: Medicare Other | Admitting: Internal Medicine

## 2012-12-30 VITALS — BP 136/80 | HR 84 | Temp 98.0°F | Wt 273.0 lb

## 2012-12-30 DIAGNOSIS — I6529 Occlusion and stenosis of unspecified carotid artery: Secondary | ICD-10-CM | POA: Diagnosis not present

## 2012-12-30 DIAGNOSIS — I798 Other disorders of arteries, arterioles and capillaries in diseases classified elsewhere: Secondary | ICD-10-CM

## 2012-12-30 DIAGNOSIS — R42 Dizziness and giddiness: Secondary | ICD-10-CM | POA: Diagnosis not present

## 2012-12-30 DIAGNOSIS — I10 Essential (primary) hypertension: Secondary | ICD-10-CM | POA: Diagnosis not present

## 2012-12-30 DIAGNOSIS — E1159 Type 2 diabetes mellitus with other circulatory complications: Secondary | ICD-10-CM | POA: Diagnosis not present

## 2012-12-30 DIAGNOSIS — I739 Peripheral vascular disease, unspecified: Secondary | ICD-10-CM | POA: Diagnosis not present

## 2012-12-30 DIAGNOSIS — F32A Depression, unspecified: Secondary | ICD-10-CM

## 2012-12-30 DIAGNOSIS — F329 Major depressive disorder, single episode, unspecified: Secondary | ICD-10-CM

## 2012-12-30 DIAGNOSIS — E119 Type 2 diabetes mellitus without complications: Secondary | ICD-10-CM | POA: Diagnosis not present

## 2012-12-30 MED ORDER — SERTRALINE HCL 50 MG PO TABS: 50.0000 mg | ORAL_TABLET | Freq: Every day | ORAL | Status: DC

## 2012-12-30 MED ORDER — INSULIN GLARGINE 100 UNIT/ML SOLOSTAR PEN: PEN_INJECTOR | SUBCUTANEOUS | Status: DC

## 2012-12-30 NOTE — Progress Notes (Signed)
Subjective:    Patient ID: [name] [name], male    DOB: [birth year], 57 y.o.   MRN: [id number]  HPI 57 year old male with history of diabetes, obesity, peripheral vascular disease, coronary artery disease, depression presents for acute visit. He reports intermittent episodes of vertigo. These have been ongoing for years. Over the last couple of weeks they have been slightly more frequent. They typically occur after changing position quickly such as from standing to seated or seated to standing position. Described as "room spinning" and alleviated after a few seconds of rest. No focal neurologic symptoms. No previous episode of syncope. No fever, chills, headache.  In regards to diabetes, patient reports continues to go through nutrition class a Management consultant. He reports blood sugars have been better controlled. He has been compliant with his medication. He did not bring record of blood sugars today.  He also notes recent worsening of symptoms of depressed mood. He continues to undergo counseling on a weekly basis. Notably, he had been away from counseling for over one month after recent surgery on his left leg. He continues to take Wellbutrin, sertraline, and use Valium as needed. No suicidal or homicidal ideation.  Outpatient Encounter Prescriptions as of 12/30/2012  Medication Sig Dispense Refill  . BAYER MICROLET LANCETS lancets use three times a day  100 each  12  . buPROPion (WELLBUTRIN XL) 150 MG 24 hr tablet Take 1 tablet (150 mg total) by mouth daily.  30 tablet  6  . cholecalciferol (VITAMIN D) 1000 UNITS tablet Take 1,000 Units by mouth daily.        . clopidogrel (PLAVIX) 75 MG tablet Take 1 tablet (75 mg total) by mouth daily.  90 tablet  4  . cyclobenzaprine (FLEXERIL) 10 MG tablet Take 10 mg by mouth 2 (two) times daily as needed.       . diazepam (VALIUM) 2 MG tablet Take 1 tablet (2 mg total) by mouth 3 (three) times daily.  90 tablet  3  . diazepam (VALIUM) 5 MG tablet Take 1 tablet  (5 mg total) by mouth at bedtime as needed for anxiety.  30 tablet  4  . glucose blood test strip Use as directed  100 each  11  . Insulin Glargine (LANTUS SOLOSTAR) 100 UNIT/ML SOPN INJECT UNDER THE SKIN 60 UNITS TWICE DAILY.  12 mL  11  . Lactulose 20 GM/30ML SOLN Take 30 mLs by mouth every 2 (two) hours as needed.      Elmore Guise Device MISC 1 application by Does not apply route as needed.  100 each  11  . LIDODERM 5 %       . Liraglutide (VICTOZA) 18 MG/3ML SOLN Inject 0.2 mLs (1.2 mg total) into the skin daily.  6 mL  3  . metoprolol succinate (TOPROL-XL) 50 MG 24 hr tablet take 1 tablet by mouth twice a day  60 tablet  6  . Multiple Vitamin (MULTIVITAMIN) capsule Take 1 capsule by mouth daily.        . nitroGLYCERIN (NITROSTAT) 0.4 MG SL tablet Place 1 tablet (0.4 mg total) under the tongue every 5 (five) minutes as needed.  25 tablet  1  . NOVOFINE 32G X 6 MM MISC use once daily  100 each  3  . oxyCODONE (OXYCONTIN) 10 MG 12 hr tablet Take 10 mg by mouth every 12 (twelve) hours as needed.      [name] oxyCODONE-acetaminophen (PERCOCET/ROXICET) 5-325 MG per tablet Take 2 tablets by mouth every 8 (  eight) hours as needed.  90 tablet  0  . sertraline (ZOLOFT) 50 MG tablet Take 1 tablet (50 mg total) by mouth daily.  30 tablet  6  . simvastatin (ZOCOR) 20 MG tablet Take 1 tablet (20 mg total) by mouth at bedtime.  90 tablet  1  . tamsulosin (FLOMAX) 0.4 MG CAPS        No facility-administered encounter medications on file as of 12/30/2012.   BP 136/80  Pulse 84  Temp(Src) 98 F (36.7 C) (Oral)  Wt 273 lb (123.832 kg)  BMI 40.9 kg/m2  SpO2 97%  Review of Systems  Constitutional: Negative for fever, chills, activity change, appetite change, fatigue and unexpected weight change.  Eyes: Negative for visual disturbance.  Respiratory: Negative for cough and shortness of breath.   Cardiovascular: Negative for chest pain, palpitations and leg swelling.  Gastrointestinal: Negative for abdominal pain  and abdominal distention.  Genitourinary: Negative for dysuria, urgency and difficulty urinating.  Musculoskeletal: Negative for arthralgias and gait problem.  Skin: Negative for color change and rash.  Neurological: Positive for dizziness. Negative for tremors, seizures, syncope, weakness, light-headedness, numbness and headaches.  Hematological: Negative for adenopathy.  Psychiatric/Behavioral: Positive for dysphoric mood. Negative for sleep disturbance. The patient is not nervous/anxious.        Objective:   Physical Exam  Constitutional: He is oriented to person, place, and time. He appears well-developed and well-nourished. No distress.  HENT:  Head: Normocephalic and atraumatic.  Right Ear: External ear normal.  Left Ear: External ear normal.  Nose: Nose normal.  Mouth/Throat: Oropharynx is clear and moist. No oropharyngeal exudate.  Eyes: Conjunctivae and EOM are normal. Pupils are equal, round, and reactive to light. Right eye exhibits no discharge. Left eye exhibits no discharge. No scleral icterus.  Neck: Normal range of motion. Neck supple. No tracheal deviation present. No thyromegaly present.  Cardiovascular: Normal rate, regular rhythm and normal heart sounds.  Exam reveals no gallop and no friction rub.   No murmur heard. Pulmonary/Chest: Effort normal and breath sounds normal. No respiratory distress. He has no wheezes. He has no rales. He exhibits no tenderness.  Musculoskeletal: Normal range of motion. He exhibits no edema.  Lymphadenopathy:    He has no cervical adenopathy.  Neurological: He is alert and oriented to person, place, and time. No cranial nerve deficit. Coordination normal.  Skin: Skin is warm and dry. No rash noted. He is not diaphoretic. No erythema. No pallor.  Psychiatric: He has a normal mood and affect. His behavior is normal. Judgment and thought content normal.          Assessment & Plan:

## 2012-12-30 NOTE — Assessment & Plan Note (Signed)
Encouraged pt to continue efforts at improved diet and increased physical activity. Will plan to check A1c with labs in 01/2013

## 2012-12-30 NOTE — Assessment & Plan Note (Signed)
Symptoms of depression slightly worsened. Will increase dose of sertraline 50 mg daily. Continue Wellbutrin and diazepam. Continue weekly counseling. Follow up 4 weeks or sooner if needed.

## 2012-12-30 NOTE — Assessment & Plan Note (Signed)
Intermittent episodes of vertigo most consistent with benign positional vertigo. Discussed pathophysiology of this condition. Recommended slow movements and avoidance of rapid head movement. Discussed referral to ENT for further evaluation. Discussed using Meclizine. He would like to hold off for now and monitor symptoms.

## 2012-12-30 NOTE — Patient Instructions (Signed)

## 2012-12-31 ENCOUNTER — Ambulatory Visit (INDEPENDENT_AMBULATORY_CARE_PROVIDER_SITE_OTHER): Payer: Medicare Other | Admitting: Psychology

## 2012-12-31 DIAGNOSIS — F4310 Post-traumatic stress disorder, unspecified: Secondary | ICD-10-CM

## 2012-12-31 DIAGNOSIS — F331 Major depressive disorder, recurrent, moderate: Secondary | ICD-10-CM | POA: Diagnosis not present

## 2013-01-07 ENCOUNTER — Ambulatory Visit (INDEPENDENT_AMBULATORY_CARE_PROVIDER_SITE_OTHER): Payer: Medicare Other | Admitting: Psychology

## 2013-01-07 DIAGNOSIS — F331 Major depressive disorder, recurrent, moderate: Secondary | ICD-10-CM | POA: Diagnosis not present

## 2013-01-07 DIAGNOSIS — F4310 Post-traumatic stress disorder, unspecified: Secondary | ICD-10-CM | POA: Diagnosis not present

## 2013-01-14 ENCOUNTER — Ambulatory Visit (INDEPENDENT_AMBULATORY_CARE_PROVIDER_SITE_OTHER): Payer: Medicare Other | Admitting: Psychology

## 2013-01-14 DIAGNOSIS — F331 Major depressive disorder, recurrent, moderate: Secondary | ICD-10-CM

## 2013-01-14 DIAGNOSIS — F4310 Post-traumatic stress disorder, unspecified: Secondary | ICD-10-CM | POA: Diagnosis not present

## 2013-01-21 ENCOUNTER — Ambulatory Visit (INDEPENDENT_AMBULATORY_CARE_PROVIDER_SITE_OTHER): Payer: Medicare Other | Admitting: Psychology

## 2013-01-21 DIAGNOSIS — F331 Major depressive disorder, recurrent, moderate: Secondary | ICD-10-CM | POA: Diagnosis not present

## 2013-01-21 DIAGNOSIS — F4310 Post-traumatic stress disorder, unspecified: Secondary | ICD-10-CM | POA: Diagnosis not present

## 2013-01-28 ENCOUNTER — Ambulatory Visit (INDEPENDENT_AMBULATORY_CARE_PROVIDER_SITE_OTHER): Payer: Medicare Other | Admitting: Psychology

## 2013-01-28 DIAGNOSIS — F4310 Post-traumatic stress disorder, unspecified: Secondary | ICD-10-CM | POA: Diagnosis not present

## 2013-02-04 ENCOUNTER — Ambulatory Visit (INDEPENDENT_AMBULATORY_CARE_PROVIDER_SITE_OTHER): Payer: Medicare Other | Admitting: Psychology

## 2013-02-04 DIAGNOSIS — F331 Major depressive disorder, recurrent, moderate: Secondary | ICD-10-CM | POA: Diagnosis not present

## 2013-02-04 DIAGNOSIS — F4310 Post-traumatic stress disorder, unspecified: Secondary | ICD-10-CM | POA: Diagnosis not present

## 2013-02-06 DIAGNOSIS — L03039 Cellulitis of unspecified toe: Secondary | ICD-10-CM | POA: Diagnosis not present

## 2013-02-06 DIAGNOSIS — M79609 Pain in unspecified limb: Secondary | ICD-10-CM | POA: Diagnosis not present

## 2013-02-06 DIAGNOSIS — B351 Tinea unguium: Secondary | ICD-10-CM | POA: Diagnosis not present

## 2013-02-09 ENCOUNTER — Ambulatory Visit: Payer: Medicare Other | Admitting: Internal Medicine

## 2013-02-09 ENCOUNTER — Other Ambulatory Visit: Payer: Self-pay | Admitting: Internal Medicine

## 2013-02-09 NOTE — Telephone Encounter (Signed)
Eprescribed.

## 2013-02-10 ENCOUNTER — Other Ambulatory Visit: Payer: Self-pay | Admitting: Internal Medicine

## 2013-02-10 ENCOUNTER — Other Ambulatory Visit (INDEPENDENT_AMBULATORY_CARE_PROVIDER_SITE_OTHER): Payer: Medicare Other

## 2013-02-10 DIAGNOSIS — I798 Other disorders of arteries, arterioles and capillaries in diseases classified elsewhere: Secondary | ICD-10-CM | POA: Diagnosis not present

## 2013-02-10 DIAGNOSIS — E1159 Type 2 diabetes mellitus with other circulatory complications: Secondary | ICD-10-CM

## 2013-02-10 LAB — COMPREHENSIVE METABOLIC PANEL
ALT: 21 U/L (ref 0–53)
AST: 19 U/L (ref 0–37)
Albumin: 3.6 g/dL (ref 3.5–5.2)
Alkaline Phosphatase: 108 U/L (ref 39–117)
BUN: 21 mg/dL (ref 6–23)
CO2: 27 mEq/L (ref 19–32)
Calcium: 8.8 mg/dL (ref 8.4–10.5)
Chloride: 103 mEq/L (ref 96–112)
Creatinine, Ser: 1.4 mg/dL (ref 0.4–1.5)
GFR: 54.61 mL/min — ABNORMAL LOW (ref >60.00)
Glucose, Bld: 168 mg/dL — ABNORMAL HIGH (ref 70–99)
Potassium: 4.7 mEq/L (ref 3.5–5.1)
Sodium: 138 mEq/L (ref 135–145)
Total Bilirubin: 0.4 mg/dL (ref 0.3–1.2)
Total Protein: 8.1 g/dL (ref 6.0–8.3)

## 2013-02-10 LAB — HEMOGLOBIN A1C: Hgb A1c MFr Bld: 8.3 % — ABNORMAL HIGH (ref 4.6–6.5)

## 2013-02-11 ENCOUNTER — Ambulatory Visit: Payer: Medicare Other | Admitting: Psychology

## 2013-02-12 ENCOUNTER — Ambulatory Visit (INDEPENDENT_AMBULATORY_CARE_PROVIDER_SITE_OTHER): Payer: Medicare Other | Admitting: Internal Medicine

## 2013-02-12 ENCOUNTER — Encounter: Payer: Self-pay | Admitting: Internal Medicine

## 2013-02-12 VITALS — BP 132/88 | HR 88 | Temp 98.3°F | Wt 273.0 lb

## 2013-02-12 DIAGNOSIS — N508 Other specified disorders of male genital organs: Secondary | ICD-10-CM | POA: Diagnosis not present

## 2013-02-12 DIAGNOSIS — E1159 Type 2 diabetes mellitus with other circulatory complications: Secondary | ICD-10-CM

## 2013-02-12 DIAGNOSIS — I798 Other disorders of arteries, arterioles and capillaries in diseases classified elsewhere: Secondary | ICD-10-CM

## 2013-02-12 DIAGNOSIS — E668 Other obesity: Secondary | ICD-10-CM | POA: Insufficient documentation

## 2013-02-12 DIAGNOSIS — G629 Polyneuropathy, unspecified: Secondary | ICD-10-CM

## 2013-02-12 DIAGNOSIS — N509 Disorder of male genital organs, unspecified: Secondary | ICD-10-CM

## 2013-02-12 DIAGNOSIS — N50812 Left testicular pain: Secondary | ICD-10-CM | POA: Insufficient documentation

## 2013-02-12 DIAGNOSIS — G589 Mononeuropathy, unspecified: Secondary | ICD-10-CM | POA: Diagnosis not present

## 2013-02-12 DIAGNOSIS — E669 Obesity, unspecified: Secondary | ICD-10-CM | POA: Insufficient documentation

## 2013-02-12 LAB — HM DIABETES FOOT EXAM: HM Diabetic Foot Exam: NORMAL

## 2013-02-12 NOTE — Progress Notes (Signed)
Subjective:    Patient ID: [name] [name], male    DOB: Mar 12, 1956, 57 y.o.   MRN: [id number]  HPI 57 year old male with history of obesity, diabetes, depression, peripheral vascular disease, coronary artery disease presents for followup. He reports he is generally feeling well. He is compliant with medication. Recent A1c was 8.3% which is improved compared to previous. He did not bring record of his blood sugars today.  He is concerned today about two-week history of left testicular pain. He describes pain as intermittent aching. He occasionally notes swelling of his left testicle. He denies any palpable masses or overlying skin changes. He denies any trauma to his testicle.  Outpatient Encounter Prescriptions as of 02/12/2013  Medication Sig Dispense Refill  . BAYER MICROLET LANCETS lancets use three times a day  100 each  12  . buPROPion (WELLBUTRIN XL) 150 MG 24 hr tablet take 1 tablet by mouth once daily  30 tablet  6  . cholecalciferol (VITAMIN D) 1000 UNITS tablet Take 1,000 Units by mouth daily.        . clopidogrel (PLAVIX) 75 MG tablet Take 1 tablet (75 mg total) by mouth daily.  90 tablet  4  . cyclobenzaprine (FLEXERIL) 10 MG tablet Take 10 mg by mouth 2 (two) times daily as needed.       . diazepam (VALIUM) 2 MG tablet Take 1 tablet (2 mg total) by mouth 3 (three) times daily.  90 tablet  3  . diazepam (VALIUM) 5 MG tablet Take 1 tablet (5 mg total) by mouth at bedtime as needed for anxiety.  30 tablet  4  . glucose blood test strip Use as directed  100 each  11  . Insulin Glargine (LANTUS SOLOSTAR) 100 UNIT/ML SOPN INJECT UNDER THE SKIN 60 UNITS TWICE DAILY.  12 mL  11  . Lactulose 20 GM/30ML SOLN Take 30 mLs by mouth every 2 (two) hours as needed.      Elmore Guise Device MISC 1 application by Does not apply route as needed.  100 each  11  . LIDODERM 5 %       . Liraglutide (VICTOZA) 18 MG/3ML SOLN Inject 0.2 mLs (1.2 mg total) into the skin daily.  6 mL  3  . metoprolol succinate  (TOPROL-XL) 50 MG 24 hr tablet take 1 tablet by mouth twice a day  60 tablet  6  . Multiple Vitamin (MULTIVITAMIN) capsule Take 1 capsule by mouth daily.        . nitroGLYCERIN (NITROSTAT) 0.4 MG SL tablet Place 1 tablet (0.4 mg total) under the tongue every 5 (five) minutes as needed.  25 tablet  1  . NOVOFINE 32G X 6 MM MISC use once daily  100 each  3  . oxyCODONE (OXYCONTIN) 10 MG 12 hr tablet Take 10 mg by mouth every 12 (twelve) hours as needed.      . ranitidine (ZANTAC) 150 MG tablet       . simvastatin (ZOCOR) 20 MG tablet Take 1 tablet (20 mg total) by mouth at bedtime.  90 tablet  1  . tamsulosin (FLOMAX) 0.4 MG CAPS take 1 capsule by mouth every morning  30 capsule  3  . oxyCODONE-acetaminophen (PERCOCET/ROXICET) 5-325 MG per tablet Take 2 tablets by mouth every 8 (eight) hours as needed.  90 tablet  0  . [DISCONTINUED] sertraline (ZOLOFT) 50 MG tablet Take 1 tablet (50 mg total) by mouth daily.  30 tablet  6   No facility-administered encounter medications  on file as of 02/12/2013.   BP 132/88  Pulse 88  Temp(Src) 98.3 F (36.8 C) (Oral)  Wt 273 lb (123.832 kg)  BMI 40.9 kg/m2  SpO2 96%  Review of Systems  Constitutional: Negative for fever, chills, activity change, appetite change, fatigue and unexpected weight change.  Eyes: Negative for visual disturbance.  Respiratory: Negative for cough and shortness of breath.   Cardiovascular: Negative for chest pain, palpitations and leg swelling.  Gastrointestinal: Negative for abdominal pain and abdominal distention.  Genitourinary: Positive for scrotal swelling and testicular pain. Negative for dysuria, urgency, frequency, flank pain, discharge, penile swelling, difficulty urinating, genital sores and penile pain.  Musculoskeletal: Negative for arthralgias and gait problem.  Skin: Negative for color change and rash.  Hematological: Negative for adenopathy.  Psychiatric/Behavioral: Negative for sleep disturbance and dysphoric  mood. The patient is not nervous/anxious.        Objective:   Physical Exam  Constitutional: He is oriented to person, place, and time. He appears well-developed and well-nourished. No distress.  HENT:  Head: Normocephalic and atraumatic.  Right Ear: External ear normal.  Left Ear: External ear normal.  Nose: Nose normal.  Mouth/Throat: Oropharynx is clear and moist. No oropharyngeal exudate.  Eyes: Conjunctivae and EOM are normal. Pupils are equal, round, and reactive to light. Right eye exhibits no discharge. Left eye exhibits no discharge. No scleral icterus.  Neck: Normal range of motion. Neck supple. No tracheal deviation present. No thyromegaly present.  Cardiovascular: Normal rate, regular rhythm and normal heart sounds.  Exam reveals no gallop and no friction rub.   No murmur heard. Pulmonary/Chest: Effort normal and breath sounds normal. No respiratory distress. He has no wheezes. He has no rales. He exhibits no tenderness.  Genitourinary: Penis normal. Right testis shows no mass, no swelling and no tenderness. Right testis is descended. Left testis shows no mass, no swelling and no tenderness. Left testis is descended.  Musculoskeletal: Normal range of motion. He exhibits no edema.  Lymphadenopathy:    He has no cervical adenopathy.  Neurological: He is alert and oriented to person, place, and time. No cranial nerve deficit. Coordination normal.  Skin: Skin is warm and dry. No rash noted. He is not diaphoretic. No erythema. No pallor.  Psychiatric: He has a normal mood and affect. His behavior is normal. Judgment and thought content normal.          Assessment & Plan:

## 2013-02-12 NOTE — Assessment & Plan Note (Signed)
Left sided testicular pain x 2 weeks. Exam is normal. Question if he may have torsion. Will set up ultrasound for evaluation and urology follow up.

## 2013-02-12 NOTE — Assessment & Plan Note (Signed)
Lab Results  Component Value Date   HGBA1C 8.3* 02/10/2013   BG generally well controlled. Will continue current medication. Continue ongoing diabetes education. Foot exam normal today. Follow up 3 months and prn.

## 2013-02-12 NOTE — Assessment & Plan Note (Signed)
Wt Readings from Last 3 Encounters:  02/12/13 273 lb (123.832 kg)  12/30/12 273 lb (123.832 kg)  11/04/12 277 lb (125.646 kg)   Encouraged continued effort at healthy diet and regular physical activity. Follow up in 3 months and prn.

## 2013-02-13 ENCOUNTER — Telehealth: Payer: Self-pay | Admitting: Internal Medicine

## 2013-02-13 NOTE — Telephone Encounter (Signed)
US of the testicles was normal. Have symptoms improved? Has pt scheduled follow up with urology?

## 2013-02-16 DIAGNOSIS — Z79899 Other long term (current) drug therapy: Secondary | ICD-10-CM | POA: Diagnosis not present

## 2013-02-16 NOTE — Telephone Encounter (Signed)
Patient called back and stated he is doing better and has not had any pain since Saturday so whatever you suggested is working.

## 2013-02-16 NOTE — Telephone Encounter (Signed)
Left message with wife to call office back

## 2013-02-18 ENCOUNTER — Telehealth: Payer: Self-pay | Admitting: Internal Medicine

## 2013-02-18 ENCOUNTER — Ambulatory Visit: Payer: Medicare Other | Admitting: Psychology

## 2013-02-18 NOTE — Telephone Encounter (Signed)
Noted  

## 2013-02-18 NOTE — Telephone Encounter (Signed)
We don't have any samples right now, however he can call again anytime. We get the samples fairly often.

## 2013-02-18 NOTE — Telephone Encounter (Signed)
Pt has to go out of town tomorrow morning for work.  Asking if we have any samples of Victoza. Please advise.

## 2013-02-18 NOTE — Telephone Encounter (Signed)
Pt aware.

## 2013-02-18 NOTE — Telephone Encounter (Signed)
To Dr. Walker 

## 2013-02-24 ENCOUNTER — Encounter: Payer: Self-pay | Admitting: Internal Medicine

## 2013-02-25 ENCOUNTER — Ambulatory Visit: Payer: Medicare Other | Admitting: Psychology

## 2013-02-25 ENCOUNTER — Other Ambulatory Visit: Payer: Self-pay

## 2013-03-04 ENCOUNTER — Ambulatory Visit: Payer: Medicare Other | Admitting: Psychology

## 2013-03-05 ENCOUNTER — Telehealth: Payer: Self-pay | Admitting: Internal Medicine

## 2013-03-05 NOTE — Telephone Encounter (Signed)
Informed patient we do not have any samples of Victoza

## 2013-03-05 NOTE — Telephone Encounter (Signed)
Patient would like samples of victoza

## 2013-03-09 ENCOUNTER — Telehealth: Payer: Self-pay | Admitting: Internal Medicine

## 2013-03-09 DIAGNOSIS — E119 Type 2 diabetes mellitus without complications: Secondary | ICD-10-CM

## 2013-03-09 NOTE — Telephone Encounter (Signed)
Refill on Victoza

## 2013-03-10 ENCOUNTER — Ambulatory Visit (INDEPENDENT_AMBULATORY_CARE_PROVIDER_SITE_OTHER): Payer: Medicare Other | Admitting: Psychology

## 2013-03-10 DIAGNOSIS — F331 Major depressive disorder, recurrent, moderate: Secondary | ICD-10-CM

## 2013-03-10 DIAGNOSIS — F4310 Post-traumatic stress disorder, unspecified: Secondary | ICD-10-CM | POA: Diagnosis not present

## 2013-03-10 MED ORDER — LIRAGLUTIDE 18 MG/3ML ~~LOC~~ SOPN: 0.2000 mL | PEN_INJECTOR | Freq: Every day | SUBCUTANEOUS | Status: DC

## 2013-03-10 NOTE — Telephone Encounter (Signed)
Rx sent to the pharmacy.

## 2013-03-16 ENCOUNTER — Ambulatory Visit: Payer: Medicare Other | Admitting: Cardiovascular Disease

## 2013-03-18 ENCOUNTER — Ambulatory Visit: Payer: Medicare Other | Admitting: Psychology

## 2013-03-20 ENCOUNTER — Ambulatory Visit (INDEPENDENT_AMBULATORY_CARE_PROVIDER_SITE_OTHER): Payer: Medicare Other | Admitting: Psychology

## 2013-03-20 ENCOUNTER — Encounter: Payer: Self-pay | Admitting: Cardiovascular Disease

## 2013-03-20 ENCOUNTER — Ambulatory Visit (INDEPENDENT_AMBULATORY_CARE_PROVIDER_SITE_OTHER): Payer: Medicare Other | Admitting: Cardiovascular Disease

## 2013-03-20 VITALS — BP 127/78 | HR 91 | Ht 66.0 in | Wt 273.5 lb

## 2013-03-20 DIAGNOSIS — I2510 Atherosclerotic heart disease of native coronary artery without angina pectoris: Secondary | ICD-10-CM

## 2013-03-20 DIAGNOSIS — R079 Chest pain, unspecified: Secondary | ICD-10-CM

## 2013-03-20 DIAGNOSIS — I10 Essential (primary) hypertension: Secondary | ICD-10-CM

## 2013-03-20 DIAGNOSIS — F4310 Post-traumatic stress disorder, unspecified: Secondary | ICD-10-CM

## 2013-03-20 DIAGNOSIS — F331 Major depressive disorder, recurrent, moderate: Secondary | ICD-10-CM | POA: Diagnosis not present

## 2013-03-20 DIAGNOSIS — I359 Nonrheumatic aortic valve disorder, unspecified: Secondary | ICD-10-CM

## 2013-03-20 DIAGNOSIS — E785 Hyperlipidemia, unspecified: Secondary | ICD-10-CM

## 2013-03-20 DIAGNOSIS — I350 Nonrheumatic aortic (valve) stenosis: Secondary | ICD-10-CM

## 2013-03-20 NOTE — Assessment & Plan Note (Signed)
He is stable from a cardiac standpoint with no symptoms suggestive of angina. Continue medical therapy. 

## 2013-03-20 NOTE — Progress Notes (Signed)
HPI  This is a 57 year old male who is here today for a followup visit. He has known history of coronary artery disease status post drug-eluting stent placements to the LAD in 2011. Repeat cardiac catheterization in 2012 showed significant restenosis with progression of three-vessel disease. He underwent coronary artery bypass graft surgery at that time. He also had left carotid endarterectomy at the same time. He has multiple medical conditions that include type 2 diabetes, hypertension, hyperlipidemia and peripheral arterial disease which is being followed by Dr. Ronalee Belts. Most recent stress test while in July of 2013 which showed no evidence of ischemia. Echocardiogram in July 2013 showed normal LV systolic function with mild aortic stenosis. He has been stable from a cardiac standpoint. He gets occasional and not frequent episodes of brief chest pain. No worsening dyspnea.  Allergies  Allergen Reactions  . Contrast Media [Iodinated Diagnostic Agents]     Got very hot and red  . Glipizide Other (See Comments)    ANTIDIABETICS. Burning  . Penicillins Hives and Swelling     Current Outpatient Prescriptions on File Prior to Visit  Medication Sig Dispense Refill  . BAYER MICROLET LANCETS lancets use three times a day  100 each  12  . buPROPion (WELLBUTRIN XL) 150 MG 24 hr tablet take 1 tablet by mouth once daily  30 tablet  6  . cholecalciferol (VITAMIN D) 1000 UNITS tablet Take 1,000 Units by mouth daily.        . clopidogrel (PLAVIX) 75 MG tablet Take 1 tablet (75 mg total) by mouth daily.  90 tablet  4  . cyclobenzaprine (FLEXERIL) 10 MG tablet Take 10 mg by mouth 2 (two) times daily as needed.       . diazepam (VALIUM) 2 MG tablet Take 1 tablet (2 mg total) by mouth 3 (three) times daily.  90 tablet  3  . diazepam (VALIUM) 5 MG tablet Take 1 tablet (5 mg total) by mouth at bedtime as needed for anxiety.  30 tablet  4  . glucose blood test strip Use as directed  100 each  11  .  Insulin Glargine (LANTUS SOLOSTAR) 100 UNIT/ML SOPN INJECT UNDER THE SKIN 60 UNITS TWICE DAILY.  12 mL  11  . Lactulose 20 GM/30ML SOLN Take 30 mLs by mouth every 2 (two) hours as needed.      Elmore Guise Device MISC 1 application by Does not apply route as needed.  100 each  11  . Liraglutide (VICTOZA) 18 MG/3ML SOLN Inject 0.2 mLs (1.2 mg total) into the skin daily.  6 mL  3  . metoprolol succinate (TOPROL-XL) 50 MG 24 hr tablet take 1 tablet by mouth twice a day  60 tablet  6  . Multiple Vitamin (MULTIVITAMIN) capsule Take 1 capsule by mouth daily.        . nitroGLYCERIN (NITROSTAT) 0.4 MG SL tablet Place 1 tablet (0.4 mg total) under the tongue every 5 (five) minutes as needed.  25 tablet  1  . NOVOFINE 32G X 6 MM MISC use once daily  100 each  3  . oxyCODONE (OXYCONTIN) 10 MG 12 hr tablet Take 10 mg by mouth every 12 (twelve) hours as needed.      [name] oxyCODONE-acetaminophen (PERCOCET/ROXICET) 5-325 MG per tablet Take 2 tablets by mouth every 8 (eight) hours as needed.  90 tablet  0  . simvastatin (ZOCOR) 20 MG tablet Take 1 tablet (20 mg total) by mouth at bedtime.  90 tablet  1  . tamsulosin (FLOMAX) 0.4 MG CAPS take 1 capsule by mouth every morning  30 capsule  3   No current facility-administered medications on file prior to visit.     Past Medical History  Diagnosis Date  . Peripheral vascular disease   . Diabetes mellitus   . Mild aortic stenosis   . Tobacco use disorder     recently quit  . Depression   . Psoriasis   . Neuropathy   . Obesity   . Post splenectomy syndrome   . SOB (shortness of breath)   . Carotid artery occlusion   . Coronary artery disease   . Hyperlipidemia   . Hypertension   . Bell's palsy 2007  . Sleep apnea   . Angina at rest     chronic  . Abnormal colonoscopy 06/2011    Dr. Minna Merritts, polyp     Past Surgical History  Procedure Laterality Date  . Splenectomy    . Heart stents  Jan 2011    leg stents 06/2009 and 03/2010  . Coronary artery  bypass graft  09/06/2010    At [facility]: LIMA to LAD, left radial to RCA, sequential SVG to OM3 and 4  . Cardiac catheterization  08/2010    LAD: 80% ISR, RCA: 80% ostial, OM 80-90%  . Coronary angioplasty      LAD: before CABG  . Carotid endarterectomy  08/2010    left/ Dr. Kellie Simmering  . Pta of illiac and sfa  multiple    Dr. Ronalee Belts, s/p revision 11/2012     Family History  Problem Relation Age of Onset  . Cancer Father 57  . Cancer Sister 49     History   Social History  . Marital Status: Married    Spouse Name: N/A    Number of Children: N/A  . Years of Education: N/A   Occupational History  . Not on file.   Social History Main Topics  . Smoking status: Current Some Day Smoker -- 0.30 packs/day for 40 years    Types: Cigarettes  . Smokeless tobacco: Never Used  . Alcohol Use: No  . Drug Use: No  . Sexual Activity: Not on file   Other Topics Concern  . Not on file   Social History Narrative  . No narrative on file     PHYSICAL EXAM   BP 127/78  Pulse 91  Ht 5\' 6"  (1.676 m)  Wt 273 lb 8 oz (124.059 kg)  BMI 44.17 kg/m2 Constitutional: He is oriented to person, place, and time. He appears well-developed and well-nourished. No distress.  HENT: No nasal discharge.  Head: Normocephalic and atraumatic.  Eyes: Pupils are equal and round. Right eye exhibits no discharge. Left eye exhibits no discharge.  Neck: Normal range of motion. Neck supple. No JVD present. No thyromegaly present.  Cardiovascular: Normal rate, regular rhythm, diminished S2. Exam reveals no gallop and no friction rub. There is a 2/6 systolic ejection murmur at the aortic area. Pulmonary/Chest: Effort normal and breath sounds normal. No stridor. No respiratory distress. He has no wheezes. He has no rales. He exhibits no tenderness.  Abdominal: Soft. Bowel sounds are normal. He exhibits no distension. There is no tenderness. There is no rebound and no guarding.  Musculoskeletal: Normal range of  motion. He exhibits trace edema and no tenderness.  Neurological: He is alert and oriented to person, place, and time. Coordination normal.  Skin: Skin is warm and dry. There is stasis dermatitis in the  lower extremities. He is not diaphoretic. No erythema. No pallor.  Psychiatric: He has a normal mood and affect. His behavior is normal. Judgment and thought content normal.       EKG: Sinus  Rhythm  Low voltage in precordial leads.   ABNORMAL    ASSESSMENT AND PLAN

## 2013-03-20 NOTE — Assessment & Plan Note (Signed)
Blood pressure is well controlled on current medications. 

## 2013-03-20 NOTE — Assessment & Plan Note (Signed)
Lab Results  Component Value Date   CHOL 93 11/04/2012   HDL 13.40* 11/04/2012   LDLCALC 50 04/25/2012   LDLDIRECT 45.5 11/04/2012   TRIG 228.0* 11/04/2012   CHOLHDL 7 11/04/2012   Continue treatment with simvastatin. Hopefully the improvement in his diabetes control will reflect on improvement in high triglyceride.

## 2013-03-20 NOTE — Assessment & Plan Note (Signed)
I. recommend a followup echocardiogram in one year.

## 2013-03-20 NOTE — Patient Instructions (Addendum)
Continue same medications.  Follow up in 6 months.  

## 2013-03-25 ENCOUNTER — Ambulatory Visit (INDEPENDENT_AMBULATORY_CARE_PROVIDER_SITE_OTHER): Payer: Medicare Other | Admitting: Psychology

## 2013-03-25 DIAGNOSIS — F4310 Post-traumatic stress disorder, unspecified: Secondary | ICD-10-CM

## 2013-03-25 DIAGNOSIS — N509 Disorder of male genital organs, unspecified: Secondary | ICD-10-CM | POA: Insufficient documentation

## 2013-03-25 DIAGNOSIS — N400 Enlarged prostate without lower urinary tract symptoms: Secondary | ICD-10-CM | POA: Insufficient documentation

## 2013-03-25 DIAGNOSIS — N50819 Testicular pain, unspecified: Secondary | ICD-10-CM | POA: Insufficient documentation

## 2013-03-25 DIAGNOSIS — E669 Obesity, unspecified: Secondary | ICD-10-CM | POA: Insufficient documentation

## 2013-03-25 DIAGNOSIS — F331 Major depressive disorder, recurrent, moderate: Secondary | ICD-10-CM | POA: Diagnosis not present

## 2013-03-25 DIAGNOSIS — R399 Unspecified symptoms and signs involving the genitourinary system: Secondary | ICD-10-CM | POA: Insufficient documentation

## 2013-03-25 DIAGNOSIS — R3989 Other symptoms and signs involving the genitourinary system: Secondary | ICD-10-CM | POA: Insufficient documentation

## 2013-03-25 DIAGNOSIS — E139 Other specified diabetes mellitus without complications: Secondary | ICD-10-CM | POA: Insufficient documentation

## 2013-03-25 DIAGNOSIS — G589 Mononeuropathy, unspecified: Secondary | ICD-10-CM | POA: Diagnosis not present

## 2013-03-25 DIAGNOSIS — E1351 Other specified diabetes mellitus with diabetic peripheral angiopathy without gangrene: Secondary | ICD-10-CM | POA: Insufficient documentation

## 2013-03-25 DIAGNOSIS — N402 Nodular prostate without lower urinary tract symptoms: Secondary | ICD-10-CM | POA: Diagnosis not present

## 2013-04-01 ENCOUNTER — Ambulatory Visit (INDEPENDENT_AMBULATORY_CARE_PROVIDER_SITE_OTHER): Payer: Medicare Other | Admitting: Psychology

## 2013-04-01 DIAGNOSIS — F329 Major depressive disorder, single episode, unspecified: Secondary | ICD-10-CM | POA: Diagnosis not present

## 2013-04-01 DIAGNOSIS — F331 Major depressive disorder, recurrent, moderate: Secondary | ICD-10-CM | POA: Diagnosis not present

## 2013-04-07 ENCOUNTER — Other Ambulatory Visit: Payer: Self-pay | Admitting: Internal Medicine

## 2013-04-07 ENCOUNTER — Ambulatory Visit: Payer: Medicare Other | Admitting: Neurosurgery

## 2013-04-07 ENCOUNTER — Other Ambulatory Visit: Payer: Medicare Other

## 2013-04-08 ENCOUNTER — Ambulatory Visit (INDEPENDENT_AMBULATORY_CARE_PROVIDER_SITE_OTHER): Payer: Medicare Other | Admitting: Psychology

## 2013-04-08 DIAGNOSIS — F4310 Post-traumatic stress disorder, unspecified: Secondary | ICD-10-CM

## 2013-04-08 DIAGNOSIS — F331 Major depressive disorder, recurrent, moderate: Secondary | ICD-10-CM

## 2013-04-09 DIAGNOSIS — I70219 Atherosclerosis of native arteries of extremities with intermittent claudication, unspecified extremity: Secondary | ICD-10-CM | POA: Diagnosis not present

## 2013-04-09 DIAGNOSIS — E119 Type 2 diabetes mellitus without complications: Secondary | ICD-10-CM | POA: Diagnosis not present

## 2013-04-09 DIAGNOSIS — I6529 Occlusion and stenosis of unspecified carotid artery: Secondary | ICD-10-CM | POA: Diagnosis not present

## 2013-04-09 DIAGNOSIS — I739 Peripheral vascular disease, unspecified: Secondary | ICD-10-CM | POA: Diagnosis not present

## 2013-04-15 ENCOUNTER — Ambulatory Visit: Payer: Medicare Other | Admitting: Psychology

## 2013-04-22 ENCOUNTER — Ambulatory Visit (INDEPENDENT_AMBULATORY_CARE_PROVIDER_SITE_OTHER): Payer: Medicare Other | Admitting: Psychology

## 2013-04-22 DIAGNOSIS — F331 Major depressive disorder, recurrent, moderate: Secondary | ICD-10-CM | POA: Diagnosis not present

## 2013-04-22 DIAGNOSIS — F4310 Post-traumatic stress disorder, unspecified: Secondary | ICD-10-CM

## 2013-04-29 ENCOUNTER — Ambulatory Visit: Payer: Medicare Other | Admitting: Psychology

## 2013-05-06 ENCOUNTER — Telehealth: Payer: Self-pay

## 2013-05-06 NOTE — Telephone Encounter (Signed)
Pt is wondering if he could get some Victoza samples.

## 2013-05-07 ENCOUNTER — Encounter: Payer: Self-pay | Admitting: *Deleted

## 2013-05-07 NOTE — Telephone Encounter (Signed)
Ok to give samples

## 2013-05-07 NOTE — Telephone Encounter (Signed)
Fine to give samples

## 2013-05-08 ENCOUNTER — Ambulatory Visit (INDEPENDENT_AMBULATORY_CARE_PROVIDER_SITE_OTHER): Payer: Medicare Other | Admitting: Podiatry

## 2013-05-08 ENCOUNTER — Encounter: Payer: Self-pay | Admitting: Podiatry

## 2013-05-08 ENCOUNTER — Telehealth: Payer: Self-pay | Admitting: Internal Medicine

## 2013-05-08 VITALS — BP 134/79 | HR 88 | Resp 20 | Ht 65.0 in | Wt 265.0 lb

## 2013-05-08 DIAGNOSIS — B351 Tinea unguium: Secondary | ICD-10-CM | POA: Diagnosis not present

## 2013-05-08 DIAGNOSIS — M79609 Pain in unspecified limb: Secondary | ICD-10-CM | POA: Diagnosis not present

## 2013-05-08 NOTE — Telephone Encounter (Signed)
Patient dropped off statement from certifying physician for therapeutic shoe form (in box)  Pt also wanted to know if you have any victoza samples   Please let pt know when form is ready for pick up and if we have any samples

## 2013-05-08 NOTE — Progress Notes (Signed)
Subjective:     Patient ID: [name] [name], male   DOB: March 05, 1956, 57 y.o.   MRN: [id number]  HPI patient states I need my toenails cut of both feet they are painful and I cannot do them myself. Patient is at risk diabetic with neurological and vascular disease   Review of Systems  All other systems reviewed and are negative.       Objective:   Physical Exam  Nursing note and vitals reviewed. Constitutional: He appears well-developed and well-nourished.  Neurological: He is alert.  Skin: Skin is warm.   Pulses are found to be diminished bilateral diminishment of sharp dull and vibratory. Nailbeds are thickened dystrophic and painful when pressed    Assessment:     Mycotic nail infection with pain 1-5 bilateral    Plan:     Debridement of nail bed 1-5 bilateral no iatrogenic bleeding noted

## 2013-05-08 NOTE — Telephone Encounter (Signed)
Patient informed samples may be picked up

## 2013-05-11 LAB — HM DIABETES EYE EXAM

## 2013-05-11 NOTE — Telephone Encounter (Signed)
Patient given samples forms faxed, then copied and originals mailed to patient home address on file.

## 2013-05-13 ENCOUNTER — Telehealth: Payer: Self-pay | Admitting: Podiatry

## 2013-05-13 ENCOUNTER — Ambulatory Visit (INDEPENDENT_AMBULATORY_CARE_PROVIDER_SITE_OTHER): Payer: Medicare Other | Admitting: Psychology

## 2013-05-13 DIAGNOSIS — F4310 Post-traumatic stress disorder, unspecified: Secondary | ICD-10-CM

## 2013-05-13 DIAGNOSIS — H2510 Age-related nuclear cataract, unspecified eye: Secondary | ICD-10-CM | POA: Diagnosis not present

## 2013-05-13 DIAGNOSIS — F331 Major depressive disorder, recurrent, moderate: Secondary | ICD-10-CM

## 2013-05-13 LAB — HM DIABETES EYE EXAM

## 2013-05-13 NOTE — Telephone Encounter (Signed)
LEFT MSG TO SCHEDULE AN APPOINTMENT FOR DIABETIC SHOE MEASUREMENT

## 2013-05-15 ENCOUNTER — Encounter: Payer: Self-pay | Admitting: *Deleted

## 2013-05-18 ENCOUNTER — Ambulatory Visit (INDEPENDENT_AMBULATORY_CARE_PROVIDER_SITE_OTHER): Payer: Medicare Other | Admitting: Internal Medicine

## 2013-05-18 ENCOUNTER — Encounter: Payer: Self-pay | Admitting: *Deleted

## 2013-05-18 ENCOUNTER — Encounter: Payer: Self-pay | Admitting: Internal Medicine

## 2013-05-18 VITALS — BP 138/100 | HR 83 | Temp 97.9°F | Wt 274.0 lb

## 2013-05-18 DIAGNOSIS — E039 Hypothyroidism, unspecified: Secondary | ICD-10-CM

## 2013-05-18 DIAGNOSIS — I798 Other disorders of arteries, arterioles and capillaries in diseases classified elsewhere: Secondary | ICD-10-CM | POA: Diagnosis not present

## 2013-05-18 DIAGNOSIS — M25519 Pain in unspecified shoulder: Secondary | ICD-10-CM | POA: Insufficient documentation

## 2013-05-18 DIAGNOSIS — I10 Essential (primary) hypertension: Secondary | ICD-10-CM

## 2013-05-18 DIAGNOSIS — R6889 Other general symptoms and signs: Secondary | ICD-10-CM | POA: Diagnosis not present

## 2013-05-18 DIAGNOSIS — E785 Hyperlipidemia, unspecified: Secondary | ICD-10-CM | POA: Diagnosis not present

## 2013-05-18 DIAGNOSIS — F411 Generalized anxiety disorder: Secondary | ICD-10-CM

## 2013-05-18 DIAGNOSIS — E1159 Type 2 diabetes mellitus with other circulatory complications: Secondary | ICD-10-CM | POA: Diagnosis not present

## 2013-05-18 DIAGNOSIS — F419 Anxiety disorder, unspecified: Secondary | ICD-10-CM

## 2013-05-18 DIAGNOSIS — M25512 Pain in left shoulder: Secondary | ICD-10-CM | POA: Insufficient documentation

## 2013-05-18 DIAGNOSIS — Z23 Encounter for immunization: Secondary | ICD-10-CM | POA: Diagnosis not present

## 2013-05-18 LAB — CBC WITH DIFFERENTIAL/PLATELET
Basophils Absolute: 0 10*3/uL (ref 0.0–0.1)
Basophils Relative: 0.3 % (ref 0.0–3.0)
Eosinophils Absolute: 0.2 10*3/uL (ref 0.0–0.7)
Eosinophils Relative: 2.2 % (ref 0.0–5.0)
HCT: 38.9 % — ABNORMAL LOW (ref 39.0–52.0)
Hemoglobin: 13.1 g/dL (ref 13.0–17.0)
Lymphocytes Relative: 16.1 % (ref 12.0–46.0)
Lymphs Abs: 1.6 10*3/uL (ref 0.7–4.0)
MCHC: 33.6 g/dL (ref 30.0–36.0)
MCV: 89.2 fl (ref 78.0–100.0)
Monocytes Absolute: 0.7 10*3/uL (ref 0.1–1.0)
Monocytes Relative: 6.7 % (ref 3.0–12.0)
Neutro Abs: 7.6 10*3/uL (ref 1.4–7.7)
Neutrophils Relative %: 74.7 % (ref 43.0–77.0)
Platelets: 249 10*3/uL (ref 150.0–400.0)
RBC: 4.36 Mil/uL (ref 4.22–5.81)
RDW: 16.8 % — ABNORMAL HIGH (ref 11.5–14.6)
WBC: 10.1 10*3/uL (ref 4.5–10.5)

## 2013-05-18 LAB — COMPREHENSIVE METABOLIC PANEL
ALT: 23 U/L (ref 0–53)
AST: 18 U/L (ref 0–37)
Albumin: 3.7 g/dL (ref 3.5–5.2)
Alkaline Phosphatase: 112 U/L (ref 39–117)
BUN: 18 mg/dL (ref 6–23)
CO2: 26 mEq/L (ref 19–32)
Calcium: 9 mg/dL (ref 8.4–10.5)
Chloride: 101 mEq/L (ref 96–112)
Creatinine, Ser: 1.3 mg/dL (ref 0.4–1.5)
GFR: 61.56 mL/min (ref >60.00)
Glucose, Bld: 196 mg/dL — ABNORMAL HIGH (ref 70–99)
Potassium: 5 mEq/L (ref 3.5–5.1)
Sodium: 138 mEq/L (ref 135–145)
Total Bilirubin: 0.4 mg/dL (ref 0.3–1.2)
Total Protein: 8.3 g/dL (ref 6.0–8.3)

## 2013-05-18 LAB — LDL CHOLESTEROL, DIRECT: Direct LDL: 66.9 mg/dL

## 2013-05-18 LAB — TSH: TSH: 1.11 u[IU]/mL (ref 0.35–5.50)

## 2013-05-18 LAB — LIPID PANEL
Cholesterol: 122 mg/dL (ref 0–200)
HDL: 20 mg/dL — ABNORMAL LOW (ref >39.00)
Total CHOL/HDL Ratio: 6
Triglycerides: 224 mg/dL — ABNORMAL HIGH (ref 0.0–149.0)
VLDL: 44.8 mg/dL — ABNORMAL HIGH (ref 0.0–40.0)

## 2013-05-18 LAB — HEMOGLOBIN A1C: Hgb A1c MFr Bld: 9.4 % — ABNORMAL HIGH (ref 4.6–6.5)

## 2013-05-18 MED ORDER — DIAZEPAM 2 MG PO TABS: ORAL_TABLET | ORAL | Status: DC

## 2013-05-18 MED ORDER — DIAZEPAM 5 MG PO TABS: 5.0000 mg | ORAL_TABLET | Freq: Every evening | ORAL | Status: DC | PRN

## 2013-05-18 MED ORDER — SIMVASTATIN 20 MG PO TABS: 20.0000 mg | ORAL_TABLET | Freq: Every day | ORAL | Status: DC

## 2013-05-18 MED ORDER — LOSARTAN POTASSIUM 25 MG PO TABS: 25.0000 mg | ORAL_TABLET | Freq: Every day | ORAL | Status: DC

## 2013-05-18 NOTE — Assessment & Plan Note (Signed)
Left shoulder pain after fall. Range of motion is limited by pain however strength is intact. Will set up physical therapy with goal of improved range of motion. Followup 4 weeks or sooner as needed.

## 2013-05-18 NOTE — Assessment & Plan Note (Signed)
BP Readings from Last 3 Encounters:  05/18/13 138/100  05/08/13 134/79  03/20/13 127/78   Blood pressure elevated today. Will add losartan 25 mg daily. Recheck potassium and creatinine in one week. Followup in one month.

## 2013-05-18 NOTE — Assessment & Plan Note (Signed)
Will check A1c with labs today. Patient notes some recent labile blood sugars with recent illness. Reviewed today that patient is not on an ACE inhibitor or ARB. Patient cannot recall if he may have had allergy to ACE inhibitor in the past. Will start losartan 25 mg daily. Plan to recheck creatinine and potassium in one week.

## 2013-05-18 NOTE — Assessment & Plan Note (Signed)
Wt Readings from Last 3 Encounters:  05/18/13 274 lb (124.286 kg)  05/08/13 265 lb (120.203 kg)  03/20/13 273 lb 8 oz (124.059 kg)   Encouraged low glycemic index diet and regular physical activity with goal of 175 minutes per week.

## 2013-05-18 NOTE — Patient Instructions (Signed)
Start Losartan 25mg  daily.   Recheck labs next week.  We will arrange physical therapy for your left shoulder.  Follow up in 2-4 weeks.

## 2013-05-18 NOTE — Progress Notes (Signed)
Subjective:    Patient ID: [name] [name], male    DOB: [birth year], 57 y.o.   MRN: [id number]  HPI 57 year old male with history of coronary artery disease, hypertension, hyperlipidemia, diabetes, obesity, anxiety, depression presents for followup. He reports he has not been feeling well over the last couple of weeks. His mother recently died any travel to [place]. After this travel, he developed generalized malaise and nausea. He reports he had limited by mouth intake. He denies fever or chills. Symptoms seem to have resolved. He reports blood sugars have been quite labile during this time. He did not bring record with him today. He does report compliance medications.  He is concerned today about one week history of left shoulder pain. He reports that he was standing on his back deck when he fell off the deck landing on his left shoulder. He has had pain in his left shoulder since that time. He has pain with abduction of his arm. He denies any swelling or weakness in his arm. He is not currently taking anything for this.  Outpatient Encounter Prescriptions as of 05/18/2013  Medication Sig Dispense Refill  . BAYER MICROLET LANCETS lancets use three times a day  100 each  12  . buPROPion (WELLBUTRIN XL) 150 MG 24 hr tablet take 1 tablet by mouth once daily  30 tablet  6  . cholecalciferol (VITAMIN D) 1000 UNITS tablet Take 1,000 Units by mouth daily.        . Cinnamon 500 MG capsule Take 1,000 mg by mouth daily.      . clopidogrel (PLAVIX) 75 MG tablet Take 1 tablet (75 mg total) by mouth daily.  90 tablet  4  . cyclobenzaprine (FLEXERIL) 10 MG tablet Take 10 mg by mouth 2 (two) times daily as needed.       . diazepam (VALIUM) 2 MG tablet take 1 tablet by mouth three times a day  90 tablet  3  . diazepam (VALIUM) 5 MG tablet Take 1 tablet (5 mg total) by mouth at bedtime as needed for anxiety.  30 tablet  4  . Glucosamine-Chondroit-Vit C-Mn (GLUCOSAMINE CHONDR 500 COMPLEX) CAPS Take 1,000 each by  mouth daily.      [name] glucose blood test strip Use as directed  100 each  11  . Insulin Glargine (LANTUS SOLOSTAR) 100 UNIT/ML SOPN INJECT UNDER THE SKIN 60 UNITS TWICE DAILY.  12 mL  11  . Lancet Device MISC 1 application by Does not apply route as needed.  100 each  11  . Liraglutide (VICTOZA) 18 MG/3ML SOLN Inject 0.2 mLs (1.2 mg total) into the skin daily.  6 mL  3  . magnesium gluconate (MAGONATE) 500 MG tablet Take 1,000 mg by mouth daily.      . metoprolol succinate (TOPROL-XL) 50 MG 24 hr tablet take 1 tablet by mouth twice a day  60 tablet  6  . Multiple Vitamin (MULTIVITAMIN) capsule Take 1 capsule by mouth daily.        . nitroGLYCERIN (NITROSTAT) 0.4 MG SL tablet Place 1 tablet (0.4 mg total) under the tongue every 5 (five) minutes as needed.  25 tablet  1  . NOVOFINE 32G X 6 MM MISC use once daily  100 each  3  . oxyCODONE (OXYCONTIN) 10 MG 12 hr tablet Take 10 mg by mouth every 12 (twelve) hours as needed.      . sertraline (ZOLOFT) 50 MG tablet Take 50 mg by mouth daily.       [name]  simvastatin (ZOCOR) 20 MG tablet Take 1 tablet (20 mg total) by mouth at bedtime.  90 tablet  1  . tamsulosin (FLOMAX) 0.4 MG CAPS take 1 capsule by mouth every morning  30 capsule  3  . Lactulose 20 GM/30ML SOLN Take 30 mLs by mouth every 2 (two) hours as needed.      [name] losartan (COZAAR) 25 MG tablet Take 1 tablet (25 mg total) by mouth daily.  90 tablet  3  . oxyCODONE-acetaminophen (PERCOCET/ROXICET) 5-325 MG per tablet Take 2 tablets by mouth every 8 (eight) hours as needed.  90 tablet  0   No facility-administered encounter medications on file as of 05/18/2013.   BP 138/100  Pulse 83  Temp(Src) 97.9 F (36.6 C) (Oral)  Wt 274 lb (124.286 kg)  BMI 45.6 kg/m2  SpO2 99%  Review of Systems  Constitutional: Negative for fever, chills, activity change, appetite change, fatigue and unexpected weight change.  Eyes: Negative for visual disturbance.  Respiratory: Negative for cough and shortness of  breath.   Cardiovascular: Negative for chest pain, palpitations and leg swelling.  Gastrointestinal: Negative for abdominal pain and abdominal distention.  Genitourinary: Negative for dysuria, urgency and difficulty urinating.  Musculoskeletal: Positive for arthralgias and myalgias. Negative for gait problem.  Skin: Negative for color change and rash.  Hematological: Negative for adenopathy.  Psychiatric/Behavioral: Negative for sleep disturbance and dysphoric mood. The patient is not nervous/anxious.        Objective:   Physical Exam  Constitutional: He is oriented to person, place, and time. He appears well-developed and well-nourished. No distress.  HENT:  Head: Normocephalic and atraumatic.  Right Ear: External ear normal.  Left Ear: External ear normal.  Nose: Nose normal.  Mouth/Throat: Oropharynx is clear and moist. No oropharyngeal exudate.  Eyes: Conjunctivae and EOM are normal. Pupils are equal, round, and reactive to light. Right eye exhibits no discharge. Left eye exhibits no discharge. No scleral icterus.  Neck: Normal range of motion. Neck supple. No tracheal deviation present. No thyromegaly present.  Cardiovascular: Normal rate and regular rhythm.  Exam reveals no gallop and no friction rub.   Murmur heard. Pulmonary/Chest: Effort normal and breath sounds normal. No respiratory distress. He has no wheezes. He has no rales. He exhibits no tenderness.  Musculoskeletal: He exhibits no edema.       Left shoulder: He exhibits decreased range of motion, tenderness (mild) and pain. He exhibits no swelling.  Lymphadenopathy:    He has no cervical adenopathy.  Neurological: He is alert and oriented to person, place, and time. No cranial nerve deficit. Coordination normal.  Skin: Skin is warm and dry. No rash noted. He is not diaphoretic. No erythema. No pallor.  Psychiatric: He has a normal mood and affect. His behavior is normal. Judgment and thought content normal.           Assessment & Plan:

## 2013-05-19 ENCOUNTER — Ambulatory Visit (INDEPENDENT_AMBULATORY_CARE_PROVIDER_SITE_OTHER): Payer: Medicare Other | Admitting: Podiatry

## 2013-05-19 ENCOUNTER — Encounter: Payer: Self-pay | Admitting: Podiatry

## 2013-05-19 VITALS — BP 124/72 | HR 85 | Resp 12

## 2013-05-19 DIAGNOSIS — E1149 Type 2 diabetes mellitus with other diabetic neurological complication: Secondary | ICD-10-CM | POA: Diagnosis not present

## 2013-05-19 DIAGNOSIS — E1159 Type 2 diabetes mellitus with other circulatory complications: Secondary | ICD-10-CM

## 2013-05-19 DIAGNOSIS — M2040 Other hammer toe(s) (acquired), unspecified foot: Secondary | ICD-10-CM | POA: Diagnosis not present

## 2013-05-19 NOTE — Progress Notes (Signed)
Subjective:     Patient ID: [name] [name], male   DOB: 01-Feb-1956, 57 y.o.   MRN: [id number]  Diabetes   patient states that he needs diabetic shoes and he is here for cast. He has had numerous health issues and currently diabetes that is too high along with morbid obesity  Review of Systems  All other systems reviewed and are negative.       Objective:   Physical Exam  Nursing note and vitals reviewed. Constitutional: He is oriented to person, place, and time.  Neurological: He is oriented to person, place, and time.  Skin: Skin is dry.   I noted there to be structural changes along with neurological and vascular diminishment secondary to long-term diabetes    Assessment:     At risk diabetic ulceration    Plan:     Casted for diabetic shoes will return when ready

## 2013-05-20 ENCOUNTER — Encounter: Payer: Self-pay | Admitting: Cardiovascular Disease

## 2013-05-20 ENCOUNTER — Ambulatory Visit: Payer: Medicare Other | Admitting: Psychology

## 2013-05-27 ENCOUNTER — Telehealth: Payer: Self-pay | Admitting: *Deleted

## 2013-05-27 ENCOUNTER — Ambulatory Visit (INDEPENDENT_AMBULATORY_CARE_PROVIDER_SITE_OTHER): Payer: Medicare Other | Admitting: Psychology

## 2013-05-27 DIAGNOSIS — F4310 Post-traumatic stress disorder, unspecified: Secondary | ICD-10-CM

## 2013-05-27 DIAGNOSIS — F331 Major depressive disorder, recurrent, moderate: Secondary | ICD-10-CM

## 2013-05-27 NOTE — Telephone Encounter (Signed)
BMP 401.9

## 2013-05-27 NOTE — Telephone Encounter (Signed)
Pt is coming in tomorrow what labs and dx?  

## 2013-05-28 ENCOUNTER — Other Ambulatory Visit: Payer: Self-pay | Admitting: *Deleted

## 2013-05-28 ENCOUNTER — Other Ambulatory Visit (INDEPENDENT_AMBULATORY_CARE_PROVIDER_SITE_OTHER): Payer: Medicare Other

## 2013-05-28 DIAGNOSIS — I10 Essential (primary) hypertension: Secondary | ICD-10-CM

## 2013-05-28 LAB — MICROALBUMIN / CREATININE URINE RATIO
Creatinine,U: 67.3 mg/dL
Microalb Creat Ratio: 6.2 mg/g (ref 0.0–30.0)
Microalb, Ur: 4.2 mg/dL — ABNORMAL HIGH (ref 0.0–1.9)

## 2013-05-28 LAB — BASIC METABOLIC PANEL
BUN: 21 mg/dL (ref 6–23)
CO2: 25 mEq/L (ref 19–32)
Calcium: 8.9 mg/dL (ref 8.4–10.5)
Chloride: 102 mEq/L (ref 96–112)
Creatinine, Ser: 1.3 mg/dL (ref 0.4–1.5)
GFR: 58.36 mL/min — ABNORMAL LOW (ref >60.00)
Glucose, Bld: 230 mg/dL — ABNORMAL HIGH (ref 70–99)
Potassium: 4.5 mEq/L (ref 3.5–5.1)
Sodium: 136 mEq/L (ref 135–145)

## 2013-05-29 DIAGNOSIS — M25519 Pain in unspecified shoulder: Secondary | ICD-10-CM | POA: Diagnosis not present

## 2013-05-29 DIAGNOSIS — M25619 Stiffness of unspecified shoulder, not elsewhere classified: Secondary | ICD-10-CM | POA: Diagnosis not present

## 2013-05-29 DIAGNOSIS — M75100 Unspecified rotator cuff tear or rupture of unspecified shoulder, not specified as traumatic: Secondary | ICD-10-CM | POA: Diagnosis not present

## 2013-06-02 DIAGNOSIS — M75100 Unspecified rotator cuff tear or rupture of unspecified shoulder, not specified as traumatic: Secondary | ICD-10-CM | POA: Diagnosis not present

## 2013-06-02 DIAGNOSIS — M25619 Stiffness of unspecified shoulder, not elsewhere classified: Secondary | ICD-10-CM | POA: Diagnosis not present

## 2013-06-02 DIAGNOSIS — M25519 Pain in unspecified shoulder: Secondary | ICD-10-CM | POA: Diagnosis not present

## 2013-06-03 ENCOUNTER — Ambulatory Visit (INDEPENDENT_AMBULATORY_CARE_PROVIDER_SITE_OTHER): Payer: Medicare Other | Admitting: Psychology

## 2013-06-03 ENCOUNTER — Other Ambulatory Visit: Payer: Self-pay | Admitting: Internal Medicine

## 2013-06-03 DIAGNOSIS — F331 Major depressive disorder, recurrent, moderate: Secondary | ICD-10-CM

## 2013-06-03 DIAGNOSIS — F4310 Post-traumatic stress disorder, unspecified: Secondary | ICD-10-CM

## 2013-06-09 DIAGNOSIS — M75100 Unspecified rotator cuff tear or rupture of unspecified shoulder, not specified as traumatic: Secondary | ICD-10-CM | POA: Diagnosis not present

## 2013-06-09 DIAGNOSIS — M25619 Stiffness of unspecified shoulder, not elsewhere classified: Secondary | ICD-10-CM | POA: Diagnosis not present

## 2013-06-09 DIAGNOSIS — M25519 Pain in unspecified shoulder: Secondary | ICD-10-CM | POA: Diagnosis not present

## 2013-06-10 ENCOUNTER — Ambulatory Visit (INDEPENDENT_AMBULATORY_CARE_PROVIDER_SITE_OTHER): Payer: Medicare Other | Admitting: Psychology

## 2013-06-10 ENCOUNTER — Ambulatory Visit: Payer: Medicare Other | Admitting: Internal Medicine

## 2013-06-10 ENCOUNTER — Encounter: Payer: Self-pay | Admitting: *Deleted

## 2013-06-10 DIAGNOSIS — F331 Major depressive disorder, recurrent, moderate: Secondary | ICD-10-CM | POA: Diagnosis not present

## 2013-06-10 DIAGNOSIS — F4310 Post-traumatic stress disorder, unspecified: Secondary | ICD-10-CM | POA: Diagnosis not present

## 2013-06-11 ENCOUNTER — Ambulatory Visit (INDEPENDENT_AMBULATORY_CARE_PROVIDER_SITE_OTHER): Payer: Medicare Other | Admitting: Internal Medicine

## 2013-06-11 ENCOUNTER — Encounter: Payer: Self-pay | Admitting: Internal Medicine

## 2013-06-11 VITALS — BP 120/78 | HR 91 | Temp 97.9°F | Wt 272.0 lb

## 2013-06-11 DIAGNOSIS — M25512 Pain in left shoulder: Secondary | ICD-10-CM

## 2013-06-11 DIAGNOSIS — I798 Other disorders of arteries, arterioles and capillaries in diseases classified elsewhere: Secondary | ICD-10-CM

## 2013-06-11 DIAGNOSIS — M25519 Pain in unspecified shoulder: Secondary | ICD-10-CM

## 2013-06-11 DIAGNOSIS — M75100 Unspecified rotator cuff tear or rupture of unspecified shoulder, not specified as traumatic: Secondary | ICD-10-CM | POA: Diagnosis not present

## 2013-06-11 DIAGNOSIS — E1159 Type 2 diabetes mellitus with other circulatory complications: Secondary | ICD-10-CM

## 2013-06-11 DIAGNOSIS — M25619 Stiffness of unspecified shoulder, not elsewhere classified: Secondary | ICD-10-CM | POA: Diagnosis not present

## 2013-06-11 MED ORDER — TAMSULOSIN HCL 0.4 MG PO CAPS: ORAL_CAPSULE | ORAL | Status: DC

## 2013-06-11 NOTE — Progress Notes (Signed)
Subjective:    Patient ID: [name] [name], male    DOB: 01/30/1956, 57 y.o.   MRN: [id number]  HPI 57 year old male with history of coronary artery disease status post CABG, peripheral vascular disease, diabetes, obesity, hyperlipidemia, depression presents for followup. At his last visit he was noted to have markedly elevated blood sugars with A1c greater than 9%. He was instructed to record blood sugars daily and bring to this visit. He has not been checking his blood sugars. He reports his blood sugar this morning was about 190. He has been attending a nutrition class but reports that he is mostly been eating cereal at home. He has not been exercising because of chronic pain in his leg secondary to claudication.  He also reports continued left shoulder pain. At his last visit, we set him up with PT. He has had no improvement in pain symptoms with PT. He has been using oxycodone for pain, with minimal improvement. Pain is described as aching which is made worse by movement, particularly abduction of his arm.He denies focal neurologic symptoms including weakness.  Outpatient Encounter Prescriptions as of 06/11/2013  Medication Sig  . BAYER MICROLET LANCETS lancets use three times a day  . buPROPion (WELLBUTRIN XL) 150 MG 24 hr tablet take 1 tablet by mouth once daily  . cholecalciferol (VITAMIN D) 1000 UNITS tablet Take 1,000 Units by mouth daily.    . Cinnamon 500 MG capsule Take 1,000 mg by mouth daily.  . clopidogrel (PLAVIX) 75 MG tablet Take 1 tablet (75 mg total) by mouth daily.  . cyclobenzaprine (FLEXERIL) 10 MG tablet Take 10 mg by mouth 2 (two) times daily as needed.   . diazepam (VALIUM) 2 MG tablet take 1 tablet by mouth three times a day  . diazepam (VALIUM) 5 MG tablet Take 1 tablet (5 mg total) by mouth at bedtime as needed for anxiety.  . Glucosamine-Chondroit-Vit C-Mn (GLUCOSAMINE CHONDR 500 COMPLEX) CAPS Take 1,000 each by mouth daily.  [name] glucose blood test strip Use as directed   . Insulin Glargine (LANTUS SOLOSTAR) 100 UNIT/ML SOPN INJECT UNDER THE SKIN 60 UNITS TWICE DAILY.  . Lactulose 20 GM/30ML SOLN Take 30 mLs by mouth every 2 (two) hours as needed.  Elmore Guise Device MISC 1 application by Does not apply route as needed.  . Liraglutide (VICTOZA) 18 MG/3ML SOLN Inject 0.2 mLs (1.2 mg total) into the skin daily.  [name] losartan (COZAAR) 25 MG tablet Take 1 tablet (25 mg total) by mouth daily.  . magnesium gluconate (MAGONATE) 500 MG tablet Take 1,000 mg by mouth daily.  . metoprolol succinate (TOPROL-XL) 50 MG 24 hr tablet take 1 tablet by mouth twice a day  . Multiple Vitamin (MULTIVITAMIN) capsule Take 1 capsule by mouth daily.    . nitroGLYCERIN (NITROSTAT) 0.4 MG SL tablet Place 1 tablet (0.4 mg total) under the tongue every 5 (five) minutes as needed.  [name] NOVOFINE 32G X 6 MM MISC TEST ONCE A DAY.  [name] oxyCODONE (OXYCONTIN) 10 MG 12 hr tablet Take 10 mg by mouth every 12 (twelve) hours as needed.  [name] oxyCODONE-acetaminophen (PERCOCET/ROXICET) 5-325 MG per tablet Take 2 tablets by mouth every 8 (eight) hours as needed.  . sertraline (ZOLOFT) 50 MG tablet Take 50 mg by mouth daily.   . simvastatin (ZOCOR) 20 MG tablet Take 1 tablet (20 mg total) by mouth at bedtime.  . tamsulosin (FLOMAX) 0.4 MG CAPS capsule take 1 capsule by mouth every morning  . [DISCONTINUED] tamsulosin (FLOMAX)  0.4 MG CAPS capsule take 1 capsule by mouth every morning   BP 120/78  Pulse 91  Temp(Src) 97.9 F (36.6 C) (Oral)  Wt 272 lb (123.378 kg)  SpO2 97%  Review of Systems  Constitutional: Negative for fever, chills, activity change, appetite change, fatigue and unexpected weight change.  Eyes: Negative for visual disturbance.  Respiratory: Negative for cough and shortness of breath.   Cardiovascular: Negative for chest pain, palpitations and leg swelling.  Gastrointestinal: Negative for abdominal pain and abdominal distention.  Genitourinary: Negative for dysuria, urgency and difficulty  urinating.  Musculoskeletal: Positive for arthralgias (left shoulder pain, increased with abduction left arm). Negative for gait problem.  Skin: Negative for color change and rash.  Hematological: Negative for adenopathy.  Psychiatric/Behavioral: Negative for sleep disturbance and dysphoric mood. The patient is not nervous/anxious.        Objective:   Physical Exam  Constitutional: He is oriented to person, place, and time. He appears well-developed and well-nourished. No distress.  HENT:  Head: Normocephalic and atraumatic.  Right Ear: External ear normal.  Left Ear: External ear normal.  Nose: Nose normal.  Mouth/Throat: Oropharynx is clear and moist. No oropharyngeal exudate.  Eyes: Conjunctivae and EOM are normal. Pupils are equal, round, and reactive to light. Right eye exhibits no discharge. Left eye exhibits no discharge. No scleral icterus.  Neck: Normal range of motion. Neck supple. No tracheal deviation present. No thyromegaly present.  Cardiovascular: Normal rate, regular rhythm and normal heart sounds.  Exam reveals no gallop and no friction rub.   No murmur heard. Pulmonary/Chest: Effort normal and breath sounds normal. No respiratory distress. He has no wheezes. He has no rales. He exhibits no tenderness.  Musculoskeletal: He exhibits no edema.       Left shoulder: He exhibits decreased range of motion (pain with abduction limits movement) and pain. He exhibits no tenderness and no bony tenderness.  Lymphadenopathy:    He has no cervical adenopathy.  Neurological: He is alert and oriented to person, place, and time. No cranial nerve deficit. Coordination normal.  Skin: Skin is warm and dry. No rash noted. He is not diaphoretic. No erythema. No pallor.  Psychiatric: He has a normal mood and affect. His behavior is normal. Judgment and thought content normal.          Assessment & Plan:

## 2013-06-11 NOTE — Assessment & Plan Note (Signed)
Persistent left shoulder pain despite physical therapy. Discussed setting up imaging if symptoms persist. Pt will email with follow up in 2 weeks.

## 2013-06-11 NOTE — Assessment & Plan Note (Signed)
Wt Readings from Last 3 Encounters:  06/11/13 272 lb (123.378 kg)  05/18/13 274 lb (124.286 kg)  05/08/13 265 lb (120.203 kg)   Weight has been relatively stable with minimal improvement despite nutrition counseling. Encouraged better compliance with diet. Exercise limited by chronic leg pain.

## 2013-06-11 NOTE — Progress Notes (Signed)
Pre-visit discussion using our clinic review tool. No additional management support is needed unless otherwise documented below in the visit note.  

## 2013-06-11 NOTE — Patient Instructions (Signed)
Monitor blood sugars 1-2 times daily and record blood sugars for your next visit.

## 2013-06-11 NOTE — Assessment & Plan Note (Signed)
Very poor control of blood sugars. Pt reports compliance with diet but has not been able to consistently afford medications. Will set up endocrinology evaluation. Question if alternative medication regimen might be helpful, and less expensive, for long term management. Samples of Victoza given today.

## 2013-06-16 ENCOUNTER — Telehealth: Payer: Self-pay | Admitting: Internal Medicine

## 2013-06-16 ENCOUNTER — Telehealth: Payer: Self-pay | Admitting: Emergency Medicine

## 2013-06-16 NOTE — Telephone Encounter (Signed)
Blood sugar running over 400 . The patient is in [place]. Please advise.

## 2013-06-16 NOTE — Telephone Encounter (Signed)
Agree. Needs to be seen in ED in [facility].

## 2013-06-16 NOTE — Telephone Encounter (Signed)
FYI patient in [place].

## 2013-06-16 NOTE — Telephone Encounter (Signed)
Patient informed and verbally agreed to be seen in ED

## 2013-06-16 NOTE — Telephone Encounter (Signed)
Patient Information:  Caller Name: [name] [name]  Phone: [phone]  Patient: [name] [name], [name] [name]  Gender: Male  DOB: 13-Jul-1956  Age: 57 Years  PCP: Ronette Deter (Adults only)  Office Follow Up:  Does the office need to follow up with this patient?: Yes  Instructions For The Office: Out of town in [place].  Vision blurry, weak and dizzy. Referred to ER for care.  RN Note:  Referred patient to the ER for care.  He is out of town in [place].  Vision is blurry , he states he thinks at times he zones out. Wife is with him. referred to the ER for care. Understands that he may call EMS if needed.  Understanding expressed.  Symptoms  Reason For Call & Symptoms: Patient states his blood sugar is #488. He is currently in [place], [place] for the holidays.  Can he up his insulin?  He has blurred vision and is dizzy, diarrhea and does not want to go to the hospital.    He has taken 65units Lantus Solostar this morning at 08:00 am  and Victoza.  It brought it down to #480.  He has been having issues and was referred to Endocrinology but does not see them until after Holidays.  Reviewed Health History In EMR: Yes  Reviewed Medications In EMR: Yes  Reviewed Allergies In EMR: Yes  Reviewed Surgeries / Procedures: Yes  Date of Onset of Symptoms: 06/16/2013  Guideline(s) Used:  Diabetes - High Blood Sugar  Disposition Per Guideline:   Go to ED Now  Reason For Disposition Reached:   Blood glucose > 240 mg/dl (13 mmol/l) and rapid breathing  Advice Given:  Call Back If:  Blood glucose more than 300 mg/dL (16.5 mmol/l), 2 or more times in a row.  Vomiting lasting more than 4 hours or unable to drink any liquids.  Rapid breathing occurs  You become worse.  RN Overrode Recommendation:  Go To ED  Out of town in [place].

## 2013-06-16 NOTE — Telephone Encounter (Signed)
Please refer to next encounter in reference to this. Patient advised to be seen in ED in [place]

## 2013-06-24 ENCOUNTER — Ambulatory Visit (INDEPENDENT_AMBULATORY_CARE_PROVIDER_SITE_OTHER): Payer: Medicare Other | Admitting: Psychology

## 2013-06-24 DIAGNOSIS — F4310 Post-traumatic stress disorder, unspecified: Secondary | ICD-10-CM

## 2013-06-24 DIAGNOSIS — F331 Major depressive disorder, recurrent, moderate: Secondary | ICD-10-CM

## 2013-06-26 ENCOUNTER — Encounter: Payer: Self-pay | Admitting: Podiatry

## 2013-06-26 ENCOUNTER — Ambulatory Visit (INDEPENDENT_AMBULATORY_CARE_PROVIDER_SITE_OTHER): Payer: Medicare Other | Admitting: Podiatry

## 2013-06-26 VITALS — BP 139/69 | HR 79 | Resp 16

## 2013-06-26 DIAGNOSIS — M79609 Pain in unspecified limb: Secondary | ICD-10-CM

## 2013-06-26 DIAGNOSIS — E1159 Type 2 diabetes mellitus with other circulatory complications: Secondary | ICD-10-CM

## 2013-06-26 DIAGNOSIS — B351 Tinea unguium: Secondary | ICD-10-CM | POA: Diagnosis not present

## 2013-06-26 DIAGNOSIS — M2040 Other hammer toe(s) (acquired), unspecified foot: Secondary | ICD-10-CM

## 2013-06-26 NOTE — Patient Instructions (Signed)
Diabetes and Foot Care Diabetes may cause you to have problems because of poor blood supply (circulation) to your feet and legs. This may cause the skin on your feet to become thinner, break easier, and heal more slowly. Your skin may become dry, and the skin may peel and crack. You may also have nerve damage in your legs and feet causing decreased feeling in them. You may not notice minor injuries to your feet that could lead to infections or more serious problems. Taking care of your feet is one of the most important things you can do for yourself.  HOME CARE INSTRUCTIONS  Wear shoes at all times, even in the house. Do not go barefoot. Bare feet are easily injured.  Check your feet daily for blisters, cuts, and redness. If you cannot see the bottom of your feet, use a mirror or ask someone for help.  Wash your feet with warm water (do not use hot water) and mild soap. Then pat your feet and the areas between your toes until they are completely dry. Do not soak your feet as this can dry your skin.  Apply a moisturizing lotion or petroleum jelly (that does not contain alcohol and is unscented) to the skin on your feet and to dry, brittle toenails. Do not apply lotion between your toes.  Trim your toenails straight across. Do not dig under them or around the cuticle. File the edges of your nails with an emery board or nail file.  Do not cut corns or calluses or try to remove them with medicine.  Wear clean socks or stockings every day. Make sure they are not too tight. Do not wear knee-high stockings since they may decrease blood flow to your legs.  Wear shoes that fit properly and have enough cushioning. To break in new shoes, wear them for just a few hours a day. This prevents you from injuring your feet. Always look in your shoes before you put them on to be sure there are no objects inside.  Do not cross your legs. This may decrease the blood flow to your feet.  If you find a minor scrape,  cut, or break in the skin on your feet, keep it and the skin around it clean and dry. These areas may be cleansed with mild soap and water. Do not cleanse the area with peroxide, alcohol, or iodine.  When you remove an adhesive bandage, be sure not to damage the skin around it.  If you have a wound, look at it several times a day to make sure it is healing.  Do not use heating pads or hot water bottles. They may burn your skin. If you have lost feeling in your feet or legs, you may not know it is happening until it is too late.  Make sure your health care provider performs a complete foot exam at least annually or more often if you have foot problems. Report any cuts, sores, or bruises to your health care provider immediately. SEEK MEDICAL CARE IF:   You have an injury that is not healing.  You have cuts or breaks in the skin.  You have an ingrown nail.  You notice redness on your legs or feet.  You feel burning or tingling in your legs or feet.  You have pain or cramps in your legs and feet.  Your legs or feet are numb.  Your feet always feel cold. SEEK IMMEDIATE MEDICAL CARE IF:   There is increasing redness,   swelling, or pain in or around a wound.  There is a red line that goes up your leg.  Pus is coming from a wound.  You develop a fever or as directed by your health care provider.  You notice a bad smell coming from an ulcer or wound. Document Released: 07/06/2000 Document Revised: 03/11/2013 Document Reviewed: 12/16/2012 Exit[employer] Patient Information 2014 [employer].  

## 2013-06-27 NOTE — Progress Notes (Signed)
Subjective:     Patient ID: [name] [name], male   DOB: May 24, 1956, 57 y.o.   MRN: [id number]  HPI patient states I'm here to pick up diabetic shoes and I had some problems with my blood sugar and heart problems while I was in [place] last week   Review of Systems     Objective:   Physical Exam Neurovascular status unchanged patient well oriented with structural problems of his forefoot and chronic nail disease with long-term diabetes and diminishment of circulatory and neurological status    Assessment:     Chronic long-term diabetes at risk patient    Plan:     Reviewed diabetic shoes and dispensed with instructions on and. They seem to fit well and the inserts were well fitted and the shoe and patient will be seen back for regular visits

## 2013-06-30 ENCOUNTER — Encounter: Payer: Self-pay | Admitting: Internal Medicine

## 2013-06-30 ENCOUNTER — Ambulatory Visit (INDEPENDENT_AMBULATORY_CARE_PROVIDER_SITE_OTHER): Payer: Medicare Other | Admitting: Internal Medicine

## 2013-06-30 VITALS — BP 128/74 | HR 95 | Temp 97.5°F | Resp 16 | Ht 65.0 in | Wt 273.2 lb

## 2013-06-30 DIAGNOSIS — I798 Other disorders of arteries, arterioles and capillaries in diseases classified elsewhere: Secondary | ICD-10-CM

## 2013-06-30 DIAGNOSIS — E1159 Type 2 diabetes mellitus with other circulatory complications: Secondary | ICD-10-CM | POA: Diagnosis not present

## 2013-06-30 DIAGNOSIS — H6693 Otitis media, unspecified, bilateral: Secondary | ICD-10-CM | POA: Insufficient documentation

## 2013-06-30 DIAGNOSIS — R141 Gas pain: Secondary | ICD-10-CM | POA: Insufficient documentation

## 2013-06-30 DIAGNOSIS — H6690 Otitis media, unspecified, unspecified ear: Secondary | ICD-10-CM

## 2013-06-30 DIAGNOSIS — R142 Eructation: Secondary | ICD-10-CM | POA: Insufficient documentation

## 2013-06-30 MED ORDER — LEVOFLOXACIN 500 MG PO TABS: 500.0000 mg | ORAL_TABLET | Freq: Every day | ORAL | Status: DC

## 2013-06-30 MED ORDER — INSULIN ASPART 100 UNIT/ML ~~LOC~~ SOLN: SUBCUTANEOUS | Status: DC

## 2013-06-30 NOTE — Progress Notes (Signed)
Subjective:    Patient ID: [name] [name], male    DOB: 08/23/1955, 57 y.o.   MRN: [id number]  HPI 57YO male with CAD s/p CABG, DM, HTN presents for acute visit. He is concerned because over the last few weeks, his blood sugars have been much higher than previous. BG have been 200s-300s. Lowest BG was today at 170. He denies any change in diet. He reports compliance with medications. He notes recent bilateral ear pain and recent increase in belching with foul taste in his mouth. He denies other URI symptoms such as congestion or cough. He denies abdominal pain, change in bowel habits, or other symptoms. He denies fever or chills.  Outpatient Encounter Prescriptions as of 06/30/2013  Medication Sig  . BAYER MICROLET LANCETS lancets use three times a day  . buPROPion (WELLBUTRIN XL) 150 MG 24 hr tablet take 1 tablet by mouth once daily  . cholecalciferol (VITAMIN D) 1000 UNITS tablet Take 1,000 Units by mouth daily.    . Cinnamon 500 MG capsule Take 1,000 mg by mouth daily.  . clopidogrel (PLAVIX) 75 MG tablet Take 1 tablet (75 mg total) by mouth daily.  . cyclobenzaprine (FLEXERIL) 10 MG tablet Take 10 mg by mouth 2 (two) times daily as needed.   . diazepam (VALIUM) 2 MG tablet take 1 tablet by mouth three times a day  . diazepam (VALIUM) 5 MG tablet Take 1 tablet (5 mg total) by mouth at bedtime as needed for anxiety.  . Glucosamine-Chondroit-Vit C-Mn (GLUCOSAMINE CHONDR 500 COMPLEX) CAPS Take 1,000 each by mouth daily.  [name] glucose blood test strip Use as directed  . Insulin Glargine (LANTUS SOLOSTAR) 100 UNIT/ML SOPN INJECT UNDER THE SKIN 60 UNITS TWICE DAILY.  . Lactulose 20 GM/30ML SOLN Take 30 mLs by mouth every 2 (two) hours as needed.  Elmore Guise Device MISC 1 application by Does not apply route as needed.  . Liraglutide (VICTOZA) 18 MG/3ML SOLN Inject 0.2 mLs (1.2 mg total) into the skin daily.  [name] losartan (COZAAR) 25 MG tablet Take 1 tablet (25 mg total) by mouth daily.  . magnesium  gluconate (MAGONATE) 500 MG tablet Take 1,000 mg by mouth daily.  . metoprolol succinate (TOPROL-XL) 50 MG 24 hr tablet take 1 tablet by mouth twice a day  . Multiple Vitamin (MULTIVITAMIN) capsule Take 1 capsule by mouth daily.    . nitroGLYCERIN (NITROSTAT) 0.4 MG SL tablet Place 1 tablet (0.4 mg total) under the tongue every 5 (five) minutes as needed.  [name] NOVOFINE 32G X 6 MM MISC TEST ONCE A DAY.  . sertraline (ZOLOFT) 50 MG tablet Take 50 mg by mouth daily.   . simvastatin (ZOCOR) 20 MG tablet Take 1 tablet (20 mg total) by mouth at bedtime.  . tamsulosin (FLOMAX) 0.4 MG CAPS capsule take 1 capsule by mouth every morning  . oxyCODONE (OXYCONTIN) 10 MG 12 hr tablet Take 10 mg by mouth every 12 (twelve) hours as needed.  [name] oxyCODONE-acetaminophen (PERCOCET/ROXICET) 5-325 MG per tablet Take 2 tablets by mouth every 8 (eight) hours as needed.   BP 128/74  Pulse 95  Temp(Src) 97.5 F (36.4 C) (Oral)  Resp 16  Ht 5\' 5"  (1.651 m)  Wt 273 lb 4 oz (123.945 kg)  BMI 45.47 kg/m2  SpO2 98%  Review of Systems  Constitutional: Negative for fever, chills, activity change, appetite change, fatigue and unexpected weight change.  HENT: Positive for ear pain. Negative for congestion, ear discharge, postnasal drip, rhinorrhea and sinus  pressure.   Eyes: Negative for visual disturbance.  Respiratory: Negative for cough and shortness of breath.   Cardiovascular: Negative for chest pain, palpitations and leg swelling.  Gastrointestinal: Negative for abdominal pain and abdominal distention.  Genitourinary: Negative for dysuria, urgency and difficulty urinating.  Musculoskeletal: Negative for arthralgias and gait problem.  Skin: Negative for color change and rash.  Hematological: Negative for adenopathy.  Psychiatric/Behavioral: Negative for sleep disturbance and dysphoric mood. The patient is not nervous/anxious.        Objective:   Physical Exam  Constitutional: He is oriented to person, place,  and time. He appears well-developed and well-nourished. No distress.  HENT:  Head: Normocephalic and atraumatic.  Right Ear: External ear normal. Tympanic membrane is erythematous and bulging. A middle ear effusion is present.  Left Ear: External ear normal. Tympanic membrane is erythematous and bulging. A middle ear effusion is present.  Nose: Nose normal.  Mouth/Throat: Oropharynx is clear and moist. No oropharyngeal exudate.  Eyes: Conjunctivae and EOM are normal. Pupils are equal, round, and reactive to light. Right eye exhibits no discharge. Left eye exhibits no discharge. No scleral icterus.  Neck: Normal range of motion. Neck supple. No tracheal deviation present. No thyromegaly present.  Cardiovascular: Normal rate, regular rhythm and normal heart sounds.  Exam reveals no gallop and no friction rub.   No murmur heard. Pulmonary/Chest: Effort normal and breath sounds normal. No respiratory distress. He has no wheezes. He has no rales. He exhibits no tenderness.  Musculoskeletal: Normal range of motion. He exhibits no edema.  Lymphadenopathy:    He has no cervical adenopathy.  Neurological: He is alert and oriented to person, place, and time. No cranial nerve deficit. Coordination normal.  Skin: Skin is warm and dry. No rash noted. He is not diaphoretic. No erythema. No pallor.  Psychiatric: He has a normal mood and affect. His behavior is normal. Judgment and thought content normal.          Assessment & Plan:

## 2013-06-30 NOTE — Assessment & Plan Note (Signed)
Symptoms and exam consistent with bilateral otitis media. Will start Levaquin. Follow up for recheck in 3-4 weeks and prn if symptoms are not improving.

## 2013-06-30 NOTE — Patient Instructions (Signed)
Continue Lantus 60units twice daily.   Start Novolog 5units if blood sugar is >250 prior to a meal.

## 2013-06-30 NOTE — Assessment & Plan Note (Signed)
Recent increase in belching. No improvement with PPI. Will send breath test for H. Pylori.

## 2013-06-30 NOTE — Assessment & Plan Note (Signed)
Recent increase in BG. Suspect related to recent ear infection as well as poor compliance with diet. Will continue Lantus, Victoza. Will add Novolog 5units prior to meals for BG >250. Pt has scheduled evaluation with endocrinology later this month. Encouraged compliance with diet and medications.

## 2013-07-01 ENCOUNTER — Other Ambulatory Visit (INDEPENDENT_AMBULATORY_CARE_PROVIDER_SITE_OTHER): Payer: Medicare Other

## 2013-07-01 ENCOUNTER — Ambulatory Visit (INDEPENDENT_AMBULATORY_CARE_PROVIDER_SITE_OTHER): Payer: Medicare Other | Admitting: Psychology

## 2013-07-01 ENCOUNTER — Ambulatory Visit: Payer: Medicare Other

## 2013-07-01 DIAGNOSIS — R141 Gas pain: Secondary | ICD-10-CM

## 2013-07-01 DIAGNOSIS — F4310 Post-traumatic stress disorder, unspecified: Secondary | ICD-10-CM

## 2013-07-01 DIAGNOSIS — R142 Eructation: Secondary | ICD-10-CM

## 2013-07-01 DIAGNOSIS — F331 Major depressive disorder, recurrent, moderate: Secondary | ICD-10-CM

## 2013-07-02 ENCOUNTER — Other Ambulatory Visit: Payer: Self-pay | Admitting: Internal Medicine

## 2013-07-02 DIAGNOSIS — A048 Other specified bacterial intestinal infections: Secondary | ICD-10-CM

## 2013-07-02 LAB — H. PYLORI BREATH TEST: H. pylori Breath Test: POSITIVE — AB

## 2013-07-02 MED ORDER — DOXYCYCLINE HYCLATE 100 MG PO TABS: 100.0000 mg | ORAL_TABLET | Freq: Two times a day (BID) | ORAL | Status: DC

## 2013-07-02 MED ORDER — NITAZOXANIDE 500 MG PO TABS: 500.0000 mg | ORAL_TABLET | Freq: Two times a day (BID) | ORAL | Status: DC

## 2013-07-02 MED ORDER — OMEPRAZOLE 20 MG PO CPDR: 20.0000 mg | DELAYED_RELEASE_CAPSULE | Freq: Two times a day (BID) | ORAL | Status: DC

## 2013-07-08 ENCOUNTER — Ambulatory Visit (INDEPENDENT_AMBULATORY_CARE_PROVIDER_SITE_OTHER): Payer: Medicare Other | Admitting: Psychology

## 2013-07-08 DIAGNOSIS — F331 Major depressive disorder, recurrent, moderate: Secondary | ICD-10-CM | POA: Diagnosis not present

## 2013-07-08 DIAGNOSIS — F4310 Post-traumatic stress disorder, unspecified: Secondary | ICD-10-CM | POA: Diagnosis not present

## 2013-07-09 ENCOUNTER — Other Ambulatory Visit: Payer: Self-pay | Admitting: Internal Medicine

## 2013-07-09 ENCOUNTER — Telehealth: Payer: Self-pay | Admitting: *Deleted

## 2013-07-09 ENCOUNTER — Telehealth: Payer: Self-pay

## 2013-07-09 NOTE — Telephone Encounter (Signed)
I spoke with the pt about the interaction between plavix and omeprazole.  Per the pt he is not taking omeprazole at this time.  I will remove this medication from his list.  The pt states that he is on a number of medications and currently has the flu and will look at his bottles to confirm if he is or is not taking this medication when he feels better.  I made the pt aware that if he determines that he is taking omeprazole then he needs to contact our office to have his medication changed to Protonix 40mg  daily per Dr Fletcher Anon. Pt agreed with plan.   Wellington Hampshire, MD [place], RN            Protonix 40 mg once daily.      Previous Messages      ----- Message -----  From: Lurline Del, RN  Sent: 07/08/2013 2:06 PM  To: Wellington Hampshire, MD  Subject: RE: pt takes plavix and omeprazole   Pt is taking omeprazole 20mg  twice a day. Do you want to do protonix 40mg  daily or twice a day?  ----- Message -----  From: Wellington Hampshire, MD  Sent: 07/08/2013 1:23 PM  To: Lurline Del, RN  Subject: RE: pt takes plavix and omeprazole   Switch Omeprazole to Protonix.   ----- Message -----  From: Lurline Del, RN  Sent: 07/08/2013 12:27 PM  To: Wellington Hampshire, MD  Subject: pt takes plavix and omeprazole   Notification sent from [facility] about medication interaction. Let me know if you want to change pt's medication.

## 2013-07-09 NOTE — Telephone Encounter (Signed)
Patient stated he was no longer taking Omeprazole and he is not interested in starting protonix. He will call if he is interested in starting protonix.

## 2013-07-09 NOTE — Telephone Encounter (Signed)
Patient called has a question about what meds he can take with the plavix. A nurse from [facility] called him but he doesn't want to call the [facility] office.

## 2013-07-10 ENCOUNTER — Telehealth: Payer: Self-pay | Admitting: *Deleted

## 2013-07-10 NOTE — Telephone Encounter (Signed)
Yes. H Pylori breath test.

## 2013-07-10 NOTE — Telephone Encounter (Signed)
Pt is coming in on 12.22.2014, it is just for the H. Pylori breath test?

## 2013-07-13 ENCOUNTER — Other Ambulatory Visit: Payer: Medicare Other

## 2013-07-13 DIAGNOSIS — R141 Gas pain: Secondary | ICD-10-CM

## 2013-07-14 LAB — H. PYLORI BREATH TEST: H. pylori Breath Test: NEGATIVE

## 2013-07-29 ENCOUNTER — Ambulatory Visit (INDEPENDENT_AMBULATORY_CARE_PROVIDER_SITE_OTHER): Payer: Medicare Other | Admitting: Psychology

## 2013-07-29 DIAGNOSIS — F331 Major depressive disorder, recurrent, moderate: Secondary | ICD-10-CM | POA: Diagnosis not present

## 2013-07-29 DIAGNOSIS — F4310 Post-traumatic stress disorder, unspecified: Secondary | ICD-10-CM | POA: Diagnosis not present

## 2013-08-05 ENCOUNTER — Ambulatory Visit (INDEPENDENT_AMBULATORY_CARE_PROVIDER_SITE_OTHER): Payer: Medicare Other | Admitting: Psychology

## 2013-08-05 ENCOUNTER — Encounter: Payer: Self-pay | Admitting: Internal Medicine

## 2013-08-05 ENCOUNTER — Ambulatory Visit (INDEPENDENT_AMBULATORY_CARE_PROVIDER_SITE_OTHER): Payer: Medicare Other | Admitting: Internal Medicine

## 2013-08-05 VITALS — BP 138/78 | HR 76 | Temp 97.4°F | Ht 65.0 in | Wt 278.2 lb

## 2013-08-05 DIAGNOSIS — F331 Major depressive disorder, recurrent, moderate: Secondary | ICD-10-CM | POA: Diagnosis not present

## 2013-08-05 DIAGNOSIS — I798 Other disorders of arteries, arterioles and capillaries in diseases classified elsewhere: Secondary | ICD-10-CM | POA: Diagnosis not present

## 2013-08-05 DIAGNOSIS — F4310 Post-traumatic stress disorder, unspecified: Secondary | ICD-10-CM | POA: Diagnosis not present

## 2013-08-05 DIAGNOSIS — E1159 Type 2 diabetes mellitus with other circulatory complications: Secondary | ICD-10-CM | POA: Diagnosis not present

## 2013-08-05 MED ORDER — INSULIN ASPART 100 UNIT/ML ~~LOC~~ SOLN: SUBCUTANEOUS | Status: DC

## 2013-08-05 MED ORDER — INSULIN GLARGINE 100 UNIT/ML SOLOSTAR PEN: PEN_INJECTOR | SUBCUTANEOUS | Status: DC

## 2013-08-05 NOTE — Patient Instructions (Signed)
Please stop Victoza. Please decrease Lantus to 60 units at night. Increase NovoLog to 12 units with a smaller meal and 14 units with a larger meal.  Take the NovoLog 15 min before you start eating.   When injecting insulin:  Inject in the abdomen  Rotate the injection sites around the belly button  Change needle for each injection  Keep needle in for 10 sec after last unit of insulin in  Keep insulin in use out of the fridge  Please call me or send me a message if sugars consistently <80 or >200. Please return in 1 month with your sugar log.    PATIENT INSTRUCTIONS FOR TYPE 2 DIABETES:   DIET AND EXERCISE Diet and exercise is an important part of diabetic treatment.  We recommended aerobic exercise in the form of brisk walking (working between 40-60% of maximal aerobic capacity, similar to brisk walking) for 150 minutes per week (such as 30 minutes five days per week) along with 3 times per week performing 'resistance' training (using various gauge rubber tubes with handles) 5-10 exercises involving the major muscle groups (upper body, lower body and core) performing 10-15 repetitions (or near fatigue) each exercise. Start at half the above goal but build slowly to reach the above goals. If limited by weight, joint pain, or disability, we recommend daily walking in a swimming pool with water up to waist to reduce pressure from joints while allow for adequate exercise.    BLOOD GLUCOSES Monitoring your blood glucoses is important for continued management of your diabetes. Please check your blood glucoses 2-4 times a day: fasting, before meals and at bedtime (you can rotate these measurements - e.g. one day check before the 3 meals, the next day check before 2 of the meals and before bedtime, etc.   HYPOGLYCEMIA (low blood sugar) Hypoglycemia is usually a reaction to not eating, exercising, or taking too much insulin/ other diabetes drugs.  Symptoms include tremors, sweating, hunger,  confusion, headache, etc. Treat IMMEDIATELY with 15 grams of Carbs:   4 glucose tablets    cup regular juice/soda   2 tablespoons raisins   4 teaspoons sugar   1 tablespoon honey Recheck blood glucose in 15 mins and repeat above if still symptomatic/blood glucose <100. Please contact our office at [phone] if you have questions about how to next handle your insulin.  RECOMMENDATIONS TO REDUCE YOUR RISK OF DIABETIC COMPLICATIONS: * Take your prescribed MEDICATION(S). * Follow a DIABETIC diet: Complex carbs, fiber rich foods, heart healthy fish twice weekly, (monounsaturated and polyunsaturated) fats * AVOID saturated/trans fats, high fat foods, >2,300 mg salt per day. * EXERCISE at least 5 times a week for 30 minutes or preferably daily.  * DO NOT SMOKE OR DRINK more than 1 drink a day. * Check your FEET every day. Do not wear tightfitting shoes. Contact us if you develop an ulcer * See your EYE doctor once a year or more if needed * Get a FLU shot once a year * Get a PNEUMONIA vaccine once before and once after age 22 years  GOALS:  * Your Hemoglobin A1c of <7%  * fasting sugars need to be <130 * after meals sugars need to be <180 (2h after you start eating) * Your Systolic BP should be [id number] or lower  * Your Diastolic BP should be 80 or lower  * Your HDL (Good Cholesterol) should be 40 or higher  * Your LDL (Bad Cholesterol) should be 100 or lower  *  Your Triglycerides should be 150 or lower  * Your Urine microalbumin (kidney function) should be <30 * Your Body Mass Index should be 25 or lower   We will be glad to help you achieve these goals. Our telephone number is: [phone].

## 2013-08-05 NOTE — Progress Notes (Signed)
Patient ID: [name] [name], male   DOB: 05-Sep-1955, 58 y.o.   MRN: [id number]  HPI: [name] [name] is a 58 y.o.-year-old male, referred by his PCP, Dr. Gilford Rile, for management of DM2, insulin-dependent, uncontrolled, with complications (CAD - CABG 2012, PVD - s/p Stents, Aortic and carotid stenosis, peripheral neuropathy).  Patient has been diagnosed with diabetes in 2006; he started insulin ~2011.  st hemoglobin A1c was: Lab Results  Component Value Date   HGBA1C 9.4* 05/18/2013   HGBA1C 8.3* 02/10/2013   HGBA1C 8.6* 11/04/2012   Pt is on a regimen of: - Victoza 1.2 mg daily - Lantus 60 units bid - does not miss.  - Novolog 5 units if sugars >200 - recently started 06/2013 - takes only if CBG >250 in am  Pt checks his sugars 2x a day and they are higher than before: 400s: - am: [phone] - 2h after b'fast: 150-300 - before lunch: n/c - 2h after lunch: n/c - before dinner: >200 - 2h after dinner: n/c - bedtime: 280->400 No lows. Lowest sugar was 120; ? At what level he has hypoglycemia awareness. Highest sugar was 400.  He stays thirsty - appears dry today.  Pt's meals are: - Breakfast: toast + eggs or cereals + milk - Lunch: soup + 1/2 sandwich - Dinner: meat + sweet potatoes or pasta + salad - Snacks: 3: sugar free foods, fruit Exercises by walking 2x a day.  - no CKD, last BUN/creatinine:  Lab Results  Component Value Date   BUN 21 05/28/2013   CREATININE 1.3 05/28/2013  He is on Cozaar. - last set of lipids: Lab Results  Component Value Date   CHOL 122 05/18/2013   HDL 20.00* 05/18/2013   LDLCALC 50 04/25/2012   LDLDIRECT 66.9 05/18/2013   TRIG 224.0* 05/18/2013   CHOLHDL 6 05/18/2013  He  Is on Zocor. - last eye exam was in 04/2013 [facility] - Dr Violet Baldy ? DR. + cataracts. - + numbness and tingling in his feet - Dr Normal Regal  Pt has FH of DM in mother, grandfather.  Past Medical History  Diagnosis Date  . Peripheral vascular disease   .  Diabetes mellitus   . Mild aortic stenosis   . Tobacco use disorder     recently quit  . Depression   . Psoriasis   . Neuropathy   . Obesity   . Post splenectomy syndrome   . SOB (shortness of breath)   . Carotid artery occlusion   . Coronary artery disease   . Hyperlipidemia   . Hypertension   . Bell's palsy 2007  . Sleep apnea   . Angina at rest     chronic  . Abnormal colonoscopy 06/2011    Dr. Minna Merritts, polyp  . Cataract     Dr. Dawna Part   Past Surgical History  Procedure Laterality Date  . Splenectomy    . Heart stents  Jan 2011    leg stents 06/2009 and 03/2010  . Coronary artery bypass graft  09/06/2010    At [facility]: LIMA to LAD, left radial to RCA, sequential SVG to OM3 and 4  . Cardiac catheterization  08/2010    LAD: 80% ISR, RCA: 80% ostial, OM 80-90%  . Coronary angioplasty      LAD: before CABG  . Carotid endarterectomy  08/2010    left/ Dr. Kellie Simmering  . Pta of illiac and sfa  multiple    Dr. Ronalee Belts, s/p revision 11/2012  History   Social History  . Marital Status: Married    Spouse Name: N/A    Number of Children: 0   Occupational History  . disabled   Social History Main Topics  . Smoking status: Current Some Day Smoker -- 0.30 packs/day for 40 years    Types: Cigarettes  . Smokeless tobacco: Never Used     Comment: smokes 2-3 cigarettes a week  . Alcohol Use: No  . Drug Use: No   Current Outpatient Prescriptions on File Prior to Visit  Medication Sig Dispense Refill  . BAYER MICROLET LANCETS lancets use three times a day  100 each  12  . buPROPion (WELLBUTRIN XL) 150 MG 24 hr tablet take 1 tablet by mouth once daily  30 tablet  6  . cholecalciferol (VITAMIN D) 1000 UNITS tablet Take 1,000 Units by mouth daily.        . Cinnamon 500 MG capsule Take 1,000 mg by mouth daily.      . clopidogrel (PLAVIX) 75 MG tablet Take 1 tablet (75 mg total) by mouth daily.  90 tablet  4  . cyclobenzaprine (FLEXERIL) 10 MG tablet Take 10 mg by mouth 2 (two) times  daily as needed.       . diazepam (VALIUM) 2 MG tablet take 1 tablet by mouth three times a day  90 tablet  3  . diazepam (VALIUM) 5 MG tablet Take 1 tablet (5 mg total) by mouth at bedtime as needed for anxiety.  30 tablet  4  . Glucosamine-Chondroit-Vit C-Mn (GLUCOSAMINE CHONDR 500 COMPLEX) CAPS Take 1,000 each by mouth daily.      [name] glucose blood test strip Use as directed  100 each  11  . insulin aspart (NOVOLOG) 100 UNIT/ML injection Take 5units if blood sugar is >200 prior to meal.  1 vial  12  . Insulin Glargine (LANTUS SOLOSTAR) 100 UNIT/ML SOPN INJECT UNDER THE SKIN 60 UNITS TWICE DAILY.  12 mL  11  . Lactulose 20 GM/30ML SOLN Take 30 mLs by mouth every 2 (two) hours as needed.      Elmore Guise Device MISC 1 application by Does not apply route as needed.  100 each  11  . Liraglutide (VICTOZA) 18 MG/3ML SOLN Inject 0.2 mLs (1.2 mg total) into the skin daily.  6 mL  3  . losartan (COZAAR) 25 MG tablet Take 1 tablet (25 mg total) by mouth daily.  90 tablet  3  . magnesium gluconate (MAGONATE) 500 MG tablet Take 1,000 mg by mouth daily.      . metoprolol succinate (TOPROL-XL) 50 MG 24 hr tablet take 1 tablet by mouth twice a day  60 tablet  6  . Multiple Vitamin (MULTIVITAMIN) capsule Take 1 capsule by mouth daily.        . nitazoxanide (ALINIA) 500 MG tablet Take 1 tablet (500 mg total) by mouth 2 (two) times daily with a meal.  20 tablet  0  . nitroGLYCERIN (NITROSTAT) 0.4 MG SL tablet Place 1 tablet (0.4 mg total) under the tongue every 5 (five) minutes as needed.  25 tablet  1  . NOVOFINE 32G X 6 MM MISC TEST ONCE A DAY.  100 each  3  . oxyCODONE (OXYCONTIN) 10 MG 12 hr tablet Take 10 mg by mouth every 12 (twelve) hours as needed.      . sertraline (ZOLOFT) 50 MG tablet Take 50 mg by mouth daily.       . sertraline (ZOLOFT)  50 MG tablet take 1 tablet by mouth once daily  30 tablet  2  . simvastatin (ZOCOR) 20 MG tablet Take 1 tablet (20 mg total) by mouth at bedtime.  90 tablet  1  .  tamsulosin (FLOMAX) 0.4 MG CAPS capsule take 1 capsule by mouth every morning  30 capsule  5  . doxycycline (VIBRA-TABS) 100 MG tablet Take 1 tablet (100 mg total) by mouth 2 (two) times daily.  20 tablet  0  . levofloxacin (LEVAQUIN) 500 MG tablet Take 1 tablet (500 mg total) by mouth daily.  7 tablet  0  . oxyCODONE-acetaminophen (PERCOCET/ROXICET) 5-325 MG per tablet Take 2 tablets by mouth every 8 (eight) hours as needed.  90 tablet  0   No current facility-administered medications on file prior to visit.   Allergies  Allergen Reactions  . Contrast Media [Iodinated Diagnostic Agents]     Got very hot and red  . Glipizide Other (See Comments)    ANTIDIABETICS. Burning  . Penicillins Hives and Swelling   Family History  Problem Relation Age of Onset  . Cancer Father 53  . Cancer Sister 45   ROS: Constitutional: + weight gain and loss , + fatigue, + hot flushes, + nocturia Eyes: + blurry vision, no xerophthalmia ENT: + sore throat, no nodules palpated in throat, + dysphagia/no odynophagia, no hoarseness, + tinnitus, + hypoacusis Cardiovascular: +  CP/+ SOB/+  palpitations/+ leg swelling Respiratory: + cough/+ SOB/ + wheezing Gastrointestinal: no N/V/D/+ C, + heartburn Musculoskeletal: + muscle aches/+ bone pain  Skin: no rashes, + itching,+ hair loss Neurological: + tremors/no numbness/tingling/dizziness, + seizures Psychiatric: + both: depression/anxiety  PE: BP 138/78  Pulse 76  Temp(Src) 97.4 F (36.3 C) (Oral)  Ht 5\' 5"  (1.651 m)  Wt 278 lb 4 oz (126.213 kg)  BMI 46.30 kg/m2  SpO2 99% Wt Readings from Last 3 Encounters:  08/05/13 278 lb 4 oz (126.213 kg)  06/30/13 273 lb 4 oz (123.945 kg)  06/11/13 272 lb (123.378 kg)   Constitutional: obese, in NAD, ruddy complexion Eyes: PERRLA, EOMI, no exophthalmos ENT: dry mucous membranes, no thyromegaly, no cervical lymphadenopathy Cardiovascular: RRR, No MRG Respiratory: CTA B Gastrointestinal: abdomen soft, NT, ND,  BS+ Musculoskeletal: no deformities, strength intact in all 4 Skin: moist, warm, + rash - rosacea on face Neurological: no tremor with outstretched hands, DTR normal in all 4  ASSESSMENT: 1. DM2, insulin-dependent, uncontrolled, with complications - CAD - s/p CABG 2012 - PVD - s/p stents  - Aortic stenosis - carotid stenosis - peripheral neuropathy   PLAN:  1. Patient with long-standing, recently more uncontrolled diabetes, on injectable regimen - with a large dose of Lantus + daily Victoza - which became insufficient. - We discussed about directions for treatment, and I suggested to:  Patient Instructions  Please stop Victoza. Please decrease Lantus to 60 units at night. Increase NovoLog to 12 units with a smaller meal and 14 units with a larger meal.  Take the NovoLog 15 min before you start eating.  When injecting insulin:  Inject in the abdomen  Rotate the injection sites around the belly button  Change needle for each injection  Keep needle in for 10 sec after last unit of insulin in  Keep insulin in use out of the fridge Please call me or send me a message if sugars consistently <80 or >200. Please return in 1 month with your sugar log.  - will likely need a SSI at next visit, but  did not want to complicate the regimen too much now - discussed briefly about better diet choices - advised to stay hydrated - Strongly advised him to start checking sugars at different times of the day - check 3 times a day, rotating checks - given sugar log and advised how to fill it and to bring it at next appt  - given foot care handout and explained the principles  - given instructions for hypoglycemia management "15-15 rule"  - advised for yearly eye exams - will get A1c at next visit - Return to clinic in 1 mo with sugar log

## 2013-08-05 NOTE — Progress Notes (Signed)
Pre-visit discussion using our clinic review tool. No additional management support is needed unless otherwise documented below in the visit note.  

## 2013-08-06 ENCOUNTER — Ambulatory Visit (INDEPENDENT_AMBULATORY_CARE_PROVIDER_SITE_OTHER): Payer: Medicare Other | Admitting: Internal Medicine

## 2013-08-06 ENCOUNTER — Encounter: Payer: Self-pay | Admitting: Internal Medicine

## 2013-08-06 VITALS — BP 130/80 | HR 83 | Temp 97.8°F | Wt 275.0 lb

## 2013-08-06 DIAGNOSIS — E1159 Type 2 diabetes mellitus with other circulatory complications: Secondary | ICD-10-CM

## 2013-08-06 DIAGNOSIS — I10 Essential (primary) hypertension: Secondary | ICD-10-CM

## 2013-08-06 DIAGNOSIS — R142 Eructation: Secondary | ICD-10-CM

## 2013-08-06 DIAGNOSIS — A048 Other specified bacterial intestinal infections: Secondary | ICD-10-CM | POA: Diagnosis not present

## 2013-08-06 DIAGNOSIS — I798 Other disorders of arteries, arterioles and capillaries in diseases classified elsewhere: Secondary | ICD-10-CM

## 2013-08-06 DIAGNOSIS — R143 Flatulence: Secondary | ICD-10-CM

## 2013-08-06 DIAGNOSIS — E785 Hyperlipidemia, unspecified: Secondary | ICD-10-CM

## 2013-08-06 DIAGNOSIS — R141 Gas pain: Secondary | ICD-10-CM

## 2013-08-06 DIAGNOSIS — F419 Anxiety disorder, unspecified: Secondary | ICD-10-CM

## 2013-08-06 DIAGNOSIS — F411 Generalized anxiety disorder: Secondary | ICD-10-CM | POA: Insufficient documentation

## 2013-08-06 DIAGNOSIS — G4730 Sleep apnea, unspecified: Secondary | ICD-10-CM

## 2013-08-06 DIAGNOSIS — B9681 Helicobacter pylori [H. pylori] as the cause of diseases classified elsewhere: Secondary | ICD-10-CM | POA: Insufficient documentation

## 2013-08-06 DIAGNOSIS — K289 Gastrojejunal ulcer, unspecified as acute or chronic, without hemorrhage or perforation: Secondary | ICD-10-CM | POA: Insufficient documentation

## 2013-08-06 MED ORDER — DIAZEPAM 2 MG PO TABS: ORAL_TABLET | ORAL | Status: DC

## 2013-08-06 MED ORDER — DIAZEPAM 5 MG PO TABS: 5.0000 mg | ORAL_TABLET | Freq: Every evening | ORAL | Status: DC | PRN

## 2013-08-06 NOTE — Progress Notes (Signed)
Pre-visit discussion using our clinic review tool. No additional management support is needed unless otherwise documented below in the visit note.  

## 2013-08-06 NOTE — Assessment & Plan Note (Signed)
  BP Readings from Last 3 Encounters:  08/06/13 130/80  08/05/13 138/78  06/30/13 128/74   BP well controlled on Losartan and Metoprolol. Will continue.

## 2013-08-06 NOTE — Progress Notes (Signed)
Subjective:    Patient ID: [name] [name], male    DOB: 1956/03/21, 58 y.o.   MRN: [id number]  HPI 58YO male with h/o CAD s/p CABG, PVD, DM, HTN, obesity, anxiety, depression presents for follow up.  DM - he reports BG have been better controlled with a few BG below 120 recently. He was recently seen by endocrinology and Victoza stopped. He continues on Novolog and Lantus.  CAD - no recent chest pain, dyspnea. Compliant with meds.  Obesity - Sedentary. Not following any particular diet, however trying to limit processed sugars.  Anxiety and Depression - Continues with counseling. Symptoms currently well controlled with current medications.  GERD - persistent symptoms of belching, abdominal distension, despite completing course of antibiotics for H. Pylori. No NVD, blood in stool, black stool, change in appetite.  Outpatient Encounter Prescriptions as of 08/06/2013  Medication Sig  . BAYER MICROLET LANCETS lancets use three times a day  . buPROPion (WELLBUTRIN XL) 150 MG 24 hr tablet take 1 tablet by mouth once daily  . cholecalciferol (VITAMIN D) 1000 UNITS tablet Take 1,000 Units by mouth daily.    . Cinnamon 500 MG capsule Take 1,000 mg by mouth daily.  . clopidogrel (PLAVIX) 75 MG tablet Take 1 tablet (75 mg total) by mouth daily.  . cyclobenzaprine (FLEXERIL) 10 MG tablet Take 10 mg by mouth 2 (two) times daily as needed.   . diazepam (VALIUM) 2 MG tablet take 1 tablet by mouth three times a day  . diazepam (VALIUM) 5 MG tablet Take 1 tablet (5 mg total) by mouth at bedtime as needed for anxiety.  . Glucosamine-Chondroit-Vit C-Mn (GLUCOSAMINE CHONDR 500 COMPLEX) CAPS Take 1,000 each by mouth daily.  [name] glucose blood test strip Use as directed  . insulin aspart (NOVOLOG) 100 UNIT/ML injection 42 Units. Inject under skin up to 42 units daily  . Insulin Glargine (LANTUS SOLOSTAR) 100 UNIT/ML Solostar Pen INJECT UNDER THE SKIN 60 UNITS DAILY.  . Lactulose 20 GM/30ML SOLN Take 30 mLs by  mouth every 2 (two) hours as needed.  Elmore Guise Device MISC 1 application by Does not apply route as needed.  [name] losartan (COZAAR) 25 MG tablet Take 1 tablet (25 mg total) by mouth daily.  . magnesium gluconate (MAGONATE) 500 MG tablet Take 1,000 mg by mouth daily.  . metoprolol succinate (TOPROL-XL) 50 MG 24 hr tablet take 1 tablet by mouth twice a day  . Multiple Vitamin (MULTIVITAMIN) capsule Take 1 capsule by mouth daily.    . nitroGLYCERIN (NITROSTAT) 0.4 MG SL tablet Place 1 tablet (0.4 mg total) under the tongue every 5 (five) minutes as needed.  [name] NOVOFINE 32G X 6 MM MISC TEST ONCE A DAY.  [name] oxyCODONE (OXYCONTIN) 10 MG 12 hr tablet Take 10 mg by mouth every 12 (twelve) hours as needed.  [name] oxyCODONE-acetaminophen (PERCOCET/ROXICET) 5-325 MG per tablet Take 2 tablets by mouth every 8 (eight) hours as needed.  . sertraline (ZOLOFT) 50 MG tablet Take 50 mg by mouth daily.   . simvastatin (ZOCOR) 20 MG tablet Take 1 tablet (20 mg total) by mouth at bedtime.  . tamsulosin (FLOMAX) 0.4 MG CAPS capsule take 1 capsule by mouth every morning   BP 130/80  Pulse 83  Temp(Src) 97.8 F (36.6 C) (Oral)  Wt 275 lb (124.739 kg)  SpO2 98%  Review of Systems  Constitutional: Negative for fever, chills, activity change, appetite change, fatigue and unexpected weight change.  Eyes: Negative for visual disturbance.  Respiratory: Negative for cough and shortness of breath.   Cardiovascular: Negative for chest pain, palpitations and leg swelling.  Gastrointestinal: Positive for abdominal distention. Negative for nausea, abdominal pain, diarrhea, constipation and blood in stool.  Genitourinary: Negative for dysuria, urgency and difficulty urinating.  Musculoskeletal: Negative for arthralgias and gait problem.  Skin: Negative for color change and rash.  Hematological: Negative for adenopathy.  Psychiatric/Behavioral: Negative for sleep disturbance and dysphoric mood. The patient is not nervous/anxious.          Objective:   Physical Exam  Constitutional: He is oriented to person, place, and time. He appears well-developed and well-nourished. No distress.  HENT:  Head: Normocephalic and atraumatic.  Right Ear: External ear normal.  Left Ear: External ear normal.  Nose: Nose normal.  Mouth/Throat: Oropharynx is clear and moist. No oropharyngeal exudate.  Eyes: Conjunctivae and EOM are normal. Pupils are equal, round, and reactive to light. Right eye exhibits no discharge. Left eye exhibits no discharge. No scleral icterus.  Neck: Normal range of motion. Neck supple. No tracheal deviation present. No thyromegaly present.  Cardiovascular: Normal rate, regular rhythm and normal heart sounds.  Exam reveals no gallop and no friction rub.   No murmur heard. Pulmonary/Chest: Effort normal and breath sounds normal. No respiratory distress. He has no wheezes. He has no rales. He exhibits no tenderness.  Musculoskeletal: Normal range of motion. He exhibits no edema.  Lymphadenopathy:    He has no cervical adenopathy.  Neurological: He is alert and oriented to person, place, and time. No cranial nerve deficit. Coordination normal.  Skin: Skin is warm and dry. No rash noted. He is not diaphoretic. No erythema. No pallor.  Psychiatric: He has a normal mood and affect. His behavior is normal. Judgment and thought content normal.          Assessment & Plan:

## 2013-08-06 NOTE — Assessment & Plan Note (Signed)
Persistent symptoms of belching, occasional abdominal distension. Recently treated for H. Pylori infection, however test of cure was negative. Will set up GI evaluation for upper endoscopy.

## 2013-08-06 NOTE — Assessment & Plan Note (Signed)
Lab Results  Component Value Date   LDLCALC 50 04/25/2012   Lipids well controlled on Simvastatin. Will continue.

## 2013-08-06 NOTE — Assessment & Plan Note (Signed)
Lab Results  Component Value Date   HGBA1C 9.4* 05/18/2013   Pt reports blood sugars better controlled recently, however did not bring record of BG today. Continues on Novolog and Lantus. Recently seen by Dr. Cruzita Lederer with planned follow up next month.

## 2013-08-06 NOTE — Assessment & Plan Note (Signed)
Recent follow up testing for H. Pylori with breath test was negative, however pt continues to be symptomatic. Will set up GI evaluation for upper endoscopy.

## 2013-08-07 ENCOUNTER — Telehealth: Payer: Self-pay | Admitting: Internal Medicine

## 2013-08-07 ENCOUNTER — Telehealth: Payer: Self-pay

## 2013-08-07 ENCOUNTER — Ambulatory Visit: Payer: Medicare Other | Admitting: Podiatry

## 2013-08-07 NOTE — Telephone Encounter (Signed)
Relevant patient education assigned to patient using Emmi. ° °

## 2013-08-12 ENCOUNTER — Ambulatory Visit (INDEPENDENT_AMBULATORY_CARE_PROVIDER_SITE_OTHER): Payer: Medicare Other | Admitting: Psychology

## 2013-08-12 DIAGNOSIS — F331 Major depressive disorder, recurrent, moderate: Secondary | ICD-10-CM | POA: Diagnosis not present

## 2013-08-12 DIAGNOSIS — F4310 Post-traumatic stress disorder, unspecified: Secondary | ICD-10-CM | POA: Diagnosis not present

## 2013-08-14 ENCOUNTER — Ambulatory Visit: Payer: Medicare Other | Admitting: Internal Medicine

## 2013-08-14 DIAGNOSIS — N509 Disorder of male genital organs, unspecified: Secondary | ICD-10-CM | POA: Diagnosis not present

## 2013-08-14 DIAGNOSIS — N400 Enlarged prostate without lower urinary tract symptoms: Secondary | ICD-10-CM | POA: Diagnosis not present

## 2013-08-19 ENCOUNTER — Ambulatory Visit: Payer: Medicare Other | Admitting: Psychology

## 2013-08-21 ENCOUNTER — Ambulatory Visit: Payer: Medicare Other | Admitting: Podiatry

## 2013-08-23 DIAGNOSIS — D369 Benign neoplasm, unspecified site: Secondary | ICD-10-CM

## 2013-08-23 HISTORY — DX: Benign neoplasm, unspecified site: D36.9

## 2013-08-26 ENCOUNTER — Ambulatory Visit (INDEPENDENT_AMBULATORY_CARE_PROVIDER_SITE_OTHER): Payer: Medicare Other | Admitting: Psychology

## 2013-08-26 ENCOUNTER — Telehealth: Payer: Self-pay | Admitting: Internal Medicine

## 2013-08-26 DIAGNOSIS — F331 Major depressive disorder, recurrent, moderate: Secondary | ICD-10-CM

## 2013-08-26 DIAGNOSIS — F4310 Post-traumatic stress disorder, unspecified: Secondary | ICD-10-CM

## 2013-08-26 NOTE — Telephone Encounter (Signed)
Relevant patient education assigned to patient using Emmi. ° °

## 2013-08-28 ENCOUNTER — Ambulatory Visit (INDEPENDENT_AMBULATORY_CARE_PROVIDER_SITE_OTHER): Payer: Medicare Other | Admitting: Podiatry

## 2013-08-28 ENCOUNTER — Encounter: Payer: Self-pay | Admitting: Podiatry

## 2013-08-28 VITALS — BP 144/64 | HR 89 | Resp 16 | Ht 66.0 in | Wt 265.0 lb

## 2013-08-28 DIAGNOSIS — B351 Tinea unguium: Secondary | ICD-10-CM

## 2013-08-28 DIAGNOSIS — M79609 Pain in unspecified limb: Secondary | ICD-10-CM | POA: Diagnosis not present

## 2013-08-28 NOTE — Progress Notes (Signed)
Subjective:     Patient ID: [name] [name], male   DOB: 17-Apr-1956, 58 y.o.   MRN: [id number]  HPI patient states my nails are incurvated and I cannot take care of them myself due to the pain and I cannot bend over and my long-term diabetes with risk  Review of Systems     Objective:   Physical Exam  patient's neurovascular status is unchanged with significant nail disease 1-5 both feet with thickness and pain when pressed    Assessment:     Mycotic nail infection to 1-5 both feet with pain    Plan:     Debridement of painful nail bed 1-5 both feet with no bleeding noted

## 2013-09-01 ENCOUNTER — Other Ambulatory Visit: Payer: Self-pay | Admitting: *Deleted

## 2013-09-01 ENCOUNTER — Encounter: Payer: Self-pay | Admitting: Internal Medicine

## 2013-09-01 NOTE — Telephone Encounter (Signed)
error 

## 2013-09-02 ENCOUNTER — Telehealth: Payer: Self-pay | Admitting: Gastroenterology

## 2013-09-02 ENCOUNTER — Encounter: Payer: Self-pay | Admitting: Internal Medicine

## 2013-09-02 ENCOUNTER — Ambulatory Visit (INDEPENDENT_AMBULATORY_CARE_PROVIDER_SITE_OTHER): Payer: Medicare Other | Admitting: Internal Medicine

## 2013-09-02 ENCOUNTER — Ambulatory Visit (INDEPENDENT_AMBULATORY_CARE_PROVIDER_SITE_OTHER): Payer: Medicare Other | Admitting: Psychology

## 2013-09-02 VITALS — BP 126/72 | HR 76 | Ht 67.72 in | Wt 274.4 lb

## 2013-09-02 DIAGNOSIS — F331 Major depressive disorder, recurrent, moderate: Secondary | ICD-10-CM | POA: Diagnosis not present

## 2013-09-02 DIAGNOSIS — Z7902 Long term (current) use of antithrombotics/antiplatelets: Secondary | ICD-10-CM | POA: Diagnosis not present

## 2013-09-02 DIAGNOSIS — F4310 Post-traumatic stress disorder, unspecified: Secondary | ICD-10-CM

## 2013-09-02 DIAGNOSIS — R143 Flatulence: Secondary | ICD-10-CM

## 2013-09-02 DIAGNOSIS — R1310 Dysphagia, unspecified: Secondary | ICD-10-CM

## 2013-09-02 DIAGNOSIS — Z86010 Personal history of colonic polyps: Secondary | ICD-10-CM | POA: Diagnosis not present

## 2013-09-02 DIAGNOSIS — R142 Eructation: Secondary | ICD-10-CM

## 2013-09-02 DIAGNOSIS — R141 Gas pain: Secondary | ICD-10-CM

## 2013-09-02 NOTE — Progress Notes (Addendum)
Patient ID: [name] [name], male   DOB: 07-Apr-1956, 58 y.o.   MRN: [id number] HPI: [name] [name] is a 58 year old male with a past medical history of CAD status post CABG and carotid endarterectomy, diabetes, mild aortic stenosis, obesity, hypertension, hyperlipidemia, sleep apnea, history of colonic polyps who is seen in consultation at the request of Dr. Gilford Rile to evaluate eructation.  He is here alone today. He reports over the last 3-4 months developing frequent and foul-smelling belching. He was treated with antibiotics for H. pylori several months ago but this did not improve symptoms. He feels that it started after his annual flu shot in October. He denies nausea or vomiting. No heartburn. He does report some trouble swallowing solid and liquid food. This happens intermittently over the past year. He reports at times with swallowing he feels like it gets "stuck". He reports normal bowel movements with no blood in his stool or melena. He does have trouble eating spicy foods as it can cause some mild epigastric discomfort. He carries TUMS in his pockets. He recalls previous colonoscopy, the last of Dr. Charolotte Capuchin. This was 3 years ago he had polyps removed. He believes the recommendation was for repeat colonoscopy in 5 years.  Past Medical History  Diagnosis Date  . Peripheral vascular disease   . Diabetes mellitus   . Mild aortic stenosis   . Tobacco use disorder     recently quit  . Depression   . Psoriasis   . Neuropathy   . Obesity   . Post splenectomy syndrome   . SOB (shortness of breath)   . Carotid artery occlusion   . Coronary artery disease   . Hyperlipidemia   . Hypertension   . Bell's palsy 2007  . Sleep apnea   . Angina at rest     chronic  . Abnormal colonoscopy 06/2011    Dr. Minna Merritts, polyp  . Cataract     Dr. Dawna Part  . Anxiety   . Arthritis   . DVT (deep venous thrombosis)     in leg  . Stroke     Past Surgical History  Procedure Laterality Date  .  Splenectomy    . Heart stents  Jan 2011    leg stents 06/2009 and 03/2010  . Coronary artery bypass graft  09/06/2010    At [facility]: LIMA to LAD, left radial to RCA, sequential SVG to OM3 and 4  . Cardiac catheterization  08/2010    LAD: 80% ISR, RCA: 80% ostial, OM 80-90%  . Coronary angioplasty      LAD: before CABG  . Carotid endarterectomy  08/2010    left/ Dr. Kellie Simmering  . Pta of illiac and sfa  multiple    Dr. Ronalee Belts, s/p revision 11/2012    Current Outpatient Prescriptions  Medication Sig Dispense Refill  . BAYER MICROLET LANCETS lancets use three times a day  100 each  12  . buPROPion (WELLBUTRIN XL) 150 MG 24 hr tablet take 1 tablet by mouth once daily  30 tablet  6  . cholecalciferol (VITAMIN D) 1000 UNITS tablet Take 1,000 Units by mouth daily.        . Cinnamon 500 MG capsule Take 1,000 mg by mouth daily.      . clopidogrel (PLAVIX) 75 MG tablet Take 1 tablet (75 mg total) by mouth daily.  90 tablet  4  . cyclobenzaprine (FLEXERIL) 10 MG tablet Take 10 mg by mouth 2 (two) times daily as needed.       [name]  diazepam (VALIUM) 2 MG tablet take 1 tablet by mouth three times a day  90 tablet  3  . diazepam (VALIUM) 5 MG tablet Take 1 tablet (5 mg total) by mouth at bedtime as needed for anxiety.  30 tablet  4  . Glucosamine-Chondroit-Vit C-Mn (GLUCOSAMINE CHONDR 500 COMPLEX) CAPS Take 1,000 each by mouth daily.      [name] glucose blood test strip Use as directed  100 each  11  . insulin aspart (NOVOLOG) 100 UNIT/ML injection 42 Units. Inject under skin up to 42 units daily      . Insulin Glargine (LANTUS SOLOSTAR) 100 UNIT/ML Solostar Pen INJECT UNDER THE SKIN 60 UNITS DAILY.  12 mL  11  . Lactulose 20 GM/30ML SOLN Take 30 mLs by mouth every 2 (two) hours as needed.      Elmore Guise Device MISC 1 application by Does not apply route as needed.  100 each  11  . losartan (COZAAR) 25 MG tablet Take 1 tablet (25 mg total) by mouth daily.  90 tablet  3  . magnesium gluconate (MAGONATE) 500 MG tablet  Take 1,000 mg by mouth daily.      . metoprolol succinate (TOPROL-XL) 50 MG 24 hr tablet take 1 tablet by mouth twice a day  60 tablet  6  . Multiple Vitamin (MULTIVITAMIN) capsule Take 1 capsule by mouth daily.        . nitazoxanide (ALINIA) 500 MG tablet Take 1 tablet (500 mg total) by mouth 2 (two) times daily with a meal.  20 tablet  0  . nitroGLYCERIN (NITROSTAT) 0.4 MG SL tablet Place 1 tablet (0.4 mg total) under the tongue every 5 (five) minutes as needed.  25 tablet  1  . NOVOFINE 32G X 6 MM MISC TEST ONCE A DAY.  100 each  3  . oxyCODONE (OXYCONTIN) 10 MG 12 hr tablet Take 10 mg by mouth every 12 (twelve) hours as needed.      [name] oxyCODONE-acetaminophen (PERCOCET/ROXICET) 5-325 MG per tablet Take 2 tablets by mouth every 8 (eight) hours as needed.  90 tablet  0  . sertraline (ZOLOFT) 50 MG tablet Take 50 mg by mouth daily.       . simvastatin (ZOCOR) 20 MG tablet Take 1 tablet (20 mg total) by mouth at bedtime.  90 tablet  1  . tamsulosin (FLOMAX) 0.4 MG CAPS capsule take 1 capsule by mouth every morning  30 capsule  5   No current facility-administered medications for this visit.    Allergies  Allergen Reactions  . Contrast Media [Iodinated Diagnostic Agents]     Got very hot and red  . Glipizide Other (See Comments)    ANTIDIABETICS. Burning  . Penicillins Hives and Swelling    Family History  Problem Relation Age of Onset  . Lung cancer Father 10  . Breast cancer Sister 24  . Diabetes Paternal Grandfather   . Dementia Mother   . Alzheimer's disease Mother     History  Substance Use Topics  . Smoking status: Current Some Day Smoker -- 0.30 packs/day for 40 years    Types: Cigarettes  . Smokeless tobacco: Never Used     Comment: smokes 2-3 cigarettes a week  . Alcohol Use: No  From [place].  Worked for many years at [employer] in [place], [place]  ROS: As per history of present illness, otherwise negative  BP 126/72  Pulse 76  Ht 5' 7.72" (1.72 m)  Wt  274 lb 6 oz (124.456 kg)  BMI 42.07 kg/m2 Constitutional: Well-developed and well-nourished. No distress. HEENT: Normocephalic and atraumatic. Oropharynx is clear and moist. No oropharyngeal exudate. Conjunctivae are normal.  No scleral icterus. Keloid over the left carotid artery Neck: Neck supple. Trachea midline. Cardiovascular: Normal rate, regular rhythm and intact distal pulses. 2/6 SEM Pulmonary/chest: Effort normal and breath sounds normal. No wheezing, rales or rhonchi. Abdominal: Soft, obese, nontender, nondistended. Bowel sounds active throughout.  Extremities: no clubbing, cyanosis, or edema Neurological: Alert and oriented to person place and time. Skin: Skin is warm and dry. No rashes noted. Psychiatric: Normal mood and affect. Behavior is normal.  RELEVANT LABS AND IMAGING: CBC    Component Value Date/Time   WBC 10.1 05/18/2013 1011   RBC 4.36 05/18/2013 1011   HGB 13.1 05/18/2013 1011   HCT 38.9* 05/18/2013 1011   PLT 249.0 05/18/2013 1011   MCV 89.2 05/18/2013 1011   MCH 28.5 09/11/2010 0510   MCHC 33.6 05/18/2013 1011   RDW 16.8* 05/18/2013 1011   LYMPHSABS 1.6 05/18/2013 1011   MONOABS 0.7 05/18/2013 1011   EOSABS 0.2 05/18/2013 1011   BASOSABS 0.0 05/18/2013 1011    CMP     Component Value Date/Time   NA 136 05/28/2013 0826   K 4.5 05/28/2013 0826   CL 102 05/28/2013 0826   CO2 25 05/28/2013 0826   GLUCOSE 230* 05/28/2013 0826   BUN 21 05/28/2013 0826   CREATININE 1.3 05/28/2013 0826   CALCIUM 8.9 05/28/2013 0826   PROT 8.3 05/18/2013 1011   ALBUMIN 3.7 05/18/2013 1011   AST 18 05/18/2013 1011   ALT 23 05/18/2013 1011   ALKPHOS 112 05/18/2013 1011   BILITOT 0.4 05/18/2013 1011   GFRNONAA 58* 09/11/2010 0510   GFRAA  Value: >60        The eGFR has been calculated using the MDRD equation. This calculation has not been validated in all clinical situations. eGFR's persistently <60 mL/min signify possible Chronic Kidney Disease. 09/11/2010 0510     ASSESSMENT/PLAN:  58 year old male with a past medical history of CAD status post CABG and carotid endarterectomy, diabetes, mild aortic stenosis, obesity, hypertension, hyperlipidemia, sleep apnea, history of colonic polyps who is seen in consultation at the request of Dr. Gilford Rile to evaluate eructation, also with complaints of esophageal dysphagia  1.  Eructation -- foul-smelling belching raises the question of possibly gastroparesis or small intestinal bacterial overgrowth. He certainly has risk factors for gastroparesis which includes diabetes and chronic narcotic use. I recommended upper endoscopy to rule out any structural abnormalities. We discussed the test today including the risks and benefits and he is agreeable to proceed. His Plavix will need to be held 5 days before the procedure. His cardiologist is Dr. Fletcher Anon, and we will seek his permission to hold the medication before the procedure.  2.  Esophageal dysphagia -- another reason for endoscopy, see #1. We did discuss possible dilation but again his Plavix will need to be held with permission.  3.  History of colonic polyps -- we will request previous colonoscopy and pathology records from Dr. Humphrey Rolls to determine when the appropriate screening interval is for surveillance colonoscopy   Addendum: Previous colonoscopy records reviewed from Dr. Dionne Milo  Colonoscopy performed on 07/10/2011, multiple adenomatous colon polyps removed one in completely from the sigmoid colon Flexible sigmoidoscopy on 02/07/2012 tubular adenoma removed from the sigmoid colon --Poor preparation recommended repeat colonoscopy in one year  --Based on this recommendation he would be due  repeat colonoscopy at this time.  If possible this could be performed on the same day as his upper endoscopy, again his Plavix would need to be held He would need a two-day prep

## 2013-09-02 NOTE — Patient Instructions (Addendum)
You have been given a separate informational sheet regarding your tobacco use, the importance of quitting and local resources to help you quit.  You have been scheduled for an endoscopy with propofol. Please follow written instructions given to you at your visit today. If you use inhalers (even only as needed), please bring them with you on the day of your procedure. Your physician has requested that you go to www.startemmi.com and enter the access code given to you at your visit today. This web site gives a general overview about your procedure. However, you should still follow specific instructions given to you by our office regarding your preparation for the procedure.  We will contact Dr. Fletcher Anon about holding your plavix prior to your procedure and contact you with those instructions.

## 2013-09-03 ENCOUNTER — Other Ambulatory Visit: Payer: Self-pay | Admitting: Gastroenterology

## 2013-09-03 ENCOUNTER — Telehealth: Payer: Self-pay | Admitting: Internal Medicine

## 2013-09-03 ENCOUNTER — Telehealth: Payer: Self-pay | Admitting: Gastroenterology

## 2013-09-03 DIAGNOSIS — Z86010 Personal history of colonic polyps: Secondary | ICD-10-CM

## 2013-09-03 MED ORDER — MOVIPREP 100 G PO SOLR
ORAL | Status: DC
Start: 2013-09-03 — End: 2013-09-15

## 2013-09-03 NOTE — Telephone Encounter (Signed)
Called pt lvm on cell phone for him to call me back regarding changes to his appointment

## 2013-09-03 NOTE — Telephone Encounter (Signed)
Message copied by Annabell Sabal on Thu Sep 03, 2013 12:48 PM ------      Message from: Jerene Bears      Created: Thu Sep 03, 2013 12:18 PM       He is to repeat colonoscopy at this time      It can be performed at the same time as his recently scheduled upper endoscopy, but we will need to fit it in to a one hour slot (this may require a date change)      Will need 2 day bowel prep      Permission to hold Plavix will need to be obtained            JMP ------

## 2013-09-03 NOTE — Telephone Encounter (Signed)
From a cardiac standpoint, Plavix can be stopped 5-7 days before th planned procedure. However, he also has history of peripheral arterial disease with multiple stenting. This is managed by Dr. Delana Meyer. I suggest checking with him.

## 2013-09-03 NOTE — Telephone Encounter (Signed)
Faxed letter to Dr. Delana Meyer [phone]  per Dr. Fletcher Anon   09/03/2013   RE: [name] [name] DOB: 05-06-1956 MRN: [id number]   Dear Dr. Delana Meyer,    We have scheduled the above patient for an endoscopic procedure. Our records show that he is on anticoagulation therapy.   Please advise as to how long the patient may come off his therapy of Palvix prior to the procedure, which is scheduled for 09/15/2013.  Dr Fletcher Anon recommened we consult with you as well.  Per Dr. Fletcher Anon: From a cardiac standpoint, Plavix can be stopped 5-7 days before the planned procedure. However, he also has history of peripheral arterial disease with multiple stenting. This is managed by Dr. Delana Meyer.   Please fax back/ or route the completed form to [place] at [phone].   Sincerely,    Christell Faith [facility] for [name] [name]. Pyrtle  M.D

## 2013-09-04 ENCOUNTER — Telehealth: Payer: Self-pay | Admitting: Emergency Medicine

## 2013-09-04 NOTE — Telephone Encounter (Signed)
Pt came in today wanting to know if you have any samples of insulin Pt is out of novalog Please advise

## 2013-09-04 NOTE — Telephone Encounter (Signed)
Patient was given 3 bottles of samples.

## 2013-09-09 ENCOUNTER — Ambulatory Visit (INDEPENDENT_AMBULATORY_CARE_PROVIDER_SITE_OTHER): Payer: Medicare Other | Admitting: Psychology

## 2013-09-09 DIAGNOSIS — F4310 Post-traumatic stress disorder, unspecified: Secondary | ICD-10-CM

## 2013-09-09 DIAGNOSIS — F331 Major depressive disorder, recurrent, moderate: Secondary | ICD-10-CM

## 2013-09-10 NOTE — Telephone Encounter (Signed)
Patient notified to stop his plavix for 5 days prior to the procedure.  Fax copy received from Dr. Fletcher Anon and Dr. Nino Parsley office.  Patient notified

## 2013-09-10 NOTE — Telephone Encounter (Signed)
Scheduled for procedure next tues 09-15-13 and has not heard back from [place] regarding his blood thinner. Also says he was expecting a letter in the mail and he never received it???

## 2013-09-11 ENCOUNTER — Telehealth: Payer: Self-pay | Admitting: Gastroenterology

## 2013-09-11 NOTE — Telephone Encounter (Signed)
lvm for pt to call me back regarding procedures. We still have have not heard back from Dr. Ronalee Belts regarding plavix hold

## 2013-09-11 NOTE — Telephone Encounter (Signed)
Per Dr. Henrine Screws nurse pt is to stop taking plavix on 09/09/2013. Pt was notified by their office on the 18th to stop. Spoke to pt and he confirmed he stopped his plavix on 09/06/2013.

## 2013-09-15 ENCOUNTER — Encounter: Payer: Self-pay | Admitting: Internal Medicine

## 2013-09-15 ENCOUNTER — Ambulatory Visit (AMBULATORY_SURGERY_CENTER): Payer: Medicare Other | Admitting: Internal Medicine

## 2013-09-15 ENCOUNTER — Encounter: Payer: Medicare Other | Admitting: Internal Medicine

## 2013-09-15 VITALS — BP 130/69 | HR 84 | Temp 97.3°F | Resp 16 | Ht 67.0 in | Wt 274.0 lb

## 2013-09-15 DIAGNOSIS — R141 Gas pain: Secondary | ICD-10-CM | POA: Diagnosis not present

## 2013-09-15 DIAGNOSIS — R1310 Dysphagia, unspecified: Secondary | ICD-10-CM | POA: Diagnosis not present

## 2013-09-15 DIAGNOSIS — Z86010 Personal history of colonic polyps: Secondary | ICD-10-CM

## 2013-09-15 DIAGNOSIS — K2970 Gastritis, unspecified, without bleeding: Secondary | ICD-10-CM | POA: Diagnosis not present

## 2013-09-15 DIAGNOSIS — R143 Flatulence: Secondary | ICD-10-CM

## 2013-09-15 DIAGNOSIS — I2510 Atherosclerotic heart disease of native coronary artery without angina pectoris: Secondary | ICD-10-CM | POA: Diagnosis not present

## 2013-09-15 DIAGNOSIS — Z951 Presence of aortocoronary bypass graft: Secondary | ICD-10-CM | POA: Diagnosis not present

## 2013-09-15 DIAGNOSIS — K2990 Gastroduodenitis, unspecified, without bleeding: Secondary | ICD-10-CM

## 2013-09-15 DIAGNOSIS — Z8673 Personal history of transient ischemic attack (TIA), and cerebral infarction without residual deficits: Secondary | ICD-10-CM | POA: Diagnosis not present

## 2013-09-15 DIAGNOSIS — G4733 Obstructive sleep apnea (adult) (pediatric): Secondary | ICD-10-CM | POA: Diagnosis not present

## 2013-09-15 DIAGNOSIS — A048 Other specified bacterial intestinal infections: Secondary | ICD-10-CM | POA: Diagnosis not present

## 2013-09-15 DIAGNOSIS — R142 Eructation: Secondary | ICD-10-CM

## 2013-09-15 DIAGNOSIS — F341 Dysthymic disorder: Secondary | ICD-10-CM | POA: Diagnosis not present

## 2013-09-15 LAB — GLUCOSE, CAPILLARY
Glucose-Capillary: 175 mg/dL — ABNORMAL HIGH (ref 70–99)
Glucose-Capillary: 156 mg/dL — ABNORMAL HIGH (ref 70–99)

## 2013-09-15 MED ORDER — SODIUM CHLORIDE 0.9 % IV SOLN: 500.0000 mL | INTRAVENOUS | Status: DC

## 2013-09-15 NOTE — Op Note (Signed)
[place]  [address] [place], [address]   COLONOSCOPY PROCEDURE REPORT  PATIENT: [name] [name], [name] [name]  MR#: [id number] BIRTHDATE: 15-May-1956 , 89  yrs. old GENDER: Male ENDOSCOPIST: Jerene Bears, MD REFERRED [id number] Gilford Rile, M.D. PROCEDURE DATE:  09/15/2013 PROCEDURE:   Colonoscopy, incomplete First Screening Colonoscopy - Avg.  risk and is 50 yrs.  old or older - No.  Prior Negative Screening - Now for repeat screening. N/A  History of Adenoma - Now for follow-up colonoscopy & has been > or = to 3 yrs.  Yes hx of adenoma.  Has been 3 or more years since last colonoscopy.  Polyps Removed Today? No.  Recommend repeat exam, <10 yrs? Yes.  Inadequate prep. ASA CLASS:   Class III INDICATIONS:elevated risk screening and Patient's personal history of adenomatous colon polyps. MEDICATIONS: MAC sedation, administered by CRNA and propofol (Diprivan) 200mg  IV  DESCRIPTION OF PROCEDURE:   After the risks benefits and alternatives of the procedure were thoroughly explained, informed consent was obtained.  A digital rectal exam revealed no rectal mass.   The LB [id number]  endoscope was introduced through the anus and advanced to the rectum. No adverse events experienced. The quality of the prep was poor, using MoviPrep  The instrument was then slowly withdrawn as the colon was fully examined.    COLON FINDINGS: A significant amount of stool, semi-solid, was present in the rectosigmoid colon and rectum.  Retroflexion was not performed. The time to cecum=minutes 0 seconds.  Withdrawal time=minutes 0 seconds.  The procedure was aborted due to inadequate bowel preparation and the scope was withdrawn and the procedure completed.  COMPLICATIONS: There were no complications.  ENDOSCOPIC IMPRESSION: Significant amount of stool was present in the rectosigmoid colon and rectum; Inadequate for surveillance colonoscopy.  RECOMMENDATIONS: 1.  Repeat colonoscopy after  a 2 day bowel preparation with GoLytely. 2.  Can resume Plavix today, though this medication will need to be held again 5 days before next colonoscopy   eSigned:  Jerene Bears, MD 09/15/2013 12:47 PM   cc: The Patient and Ronette Deter MD

## 2013-09-15 NOTE — Patient Instructions (Signed)
Restart your Plavix today.    YOU HAD AN ENDOSCOPIC PROCEDURE TODAY AT [facility]: Refer to the procedure report that was given to you for any specific questions about what was found during the examination.  If the procedure report does not answer your questions, please call your gastroenterologist to clarify.  If you requested that your care partner not be given the details of your procedure findings, then the procedure report has been included in a sealed envelope for you to review at your convenience later.  YOU SHOULD EXPECT: Some feelings of bloating in the abdomen. Passage of more gas than usual.  Walking can help get rid of the air that was put into your GI tract during the procedure and reduce the bloating. If you had a lower endoscopy (such as a colonoscopy or flexible sigmoidoscopy) you may notice spotting of blood in your stool or on the toilet paper. If you underwent a bowel prep for your procedure, then you may not have a normal bowel movement for a few days.  DIET: Your first meal following the procedure should be a light meal and then it is ok to progress to your normal diet.  A half-sandwich or bowl of soup is an example of a good first meal.  Heavy or fried foods are harder to digest and may make you feel nauseous or bloated.  Likewise meals heavy in dairy and vegetables can cause extra gas to form and this can also increase the bloating.  Drink plenty of fluids but you should avoid alcoholic beverages for 24 hours.  ACTIVITY: Your care partner should take you home directly after the procedure.  You should plan to take it easy, moving slowly for the rest of the day.  You can resume normal activity the day after the procedure however you should NOT DRIVE or use heavy machinery for 24 hours (because of the sedation medicines used during the test).    SYMPTOMS TO REPORT IMMEDIATELY: A gastroenterologist can be reached at any hour.  During normal business hours, 8:30 AM  to 5:00 PM Monday through Friday, call [phone].  After hours and on weekends, please call the GI answering service at [phone] who will take a message and have the physician on call contact you.   Following lower endoscopy (colonoscopy or flexible sigmoidoscopy):  Excessive amounts of blood in the stool  Significant tenderness or worsening of abdominal pains  Swelling of the abdomen that is new, acute  Fever of 100F or higher  Following upper endoscopy (EGD)  Vomiting of blood or coffee ground material  New chest pain or pain under the shoulder blades  Painful or persistently difficult swallowing  New shortness of breath  Fever of 100F or higher  Black, tarry-looking stools  FOLLOW UP: If any biopsies were taken you will be contacted by phone or by letter within the next 1-3 weeks.  Call your gastroenterologist if you have not heard about the biopsies in 3 weeks.  Our staff will call the home number listed on your records the next business day following your procedure to check on you and address any questions or concerns that you may have at that time regarding the information given to you following your procedure. This is a courtesy call and so if there is no answer at the home number and we have not heard from you through the emergency physician on call, we will assume that you have returned to your regular daily activities without  incident.  SIGNATURES/CONFIDENTIALITY: You and/or your care partner have signed paperwork which will be entered into your electronic medical record.  These signatures attest to the fact that that the information above on your After Visit Summary has been reviewed and is understood.  Full responsibility of the confidentiality of this discharge information lies with you and/or your care-partner.

## 2013-09-15 NOTE — Progress Notes (Signed)
Called to room to assist during endoscopic procedure.  Patient ID and intended procedure confirmed with present staff. Received instructions for my participation in the procedure from the performing physician.  

## 2013-09-15 NOTE — Op Note (Signed)
[place]  [address]. [place], [address]   ENDOSCOPY PROCEDURE REPORT  PATIENT: [name] [name], [name] [name]  MR#: [id number] BIRTHDATE: [birth year] , 57  yrs. old GENDER: Male ENDOSCOPIST: Jerene Bears, MD REFERRED BY:  Ronette Deter, M.D. PROCEDURE DATE:  09/15/2013 PROCEDURE:  EGD w/ biopsy for H.pylori ASA CLASS:     Class III INDICATIONS:  eructation, gas and bloating. MEDICATIONS: MAC sedation, administered by CRNA and propofol (Diprivan) 200mg  IV TOPICAL ANESTHETIC: Cetacaine Spray  DESCRIPTION OF PROCEDURE: After the risks benefits and alternatives of the procedure were thoroughly explained, informed consent was obtained.  The LB [id number] endoscope was introduced through the mouth and advanced to the second portion of the duodenum. Without limitations.  The instrument was slowly withdrawn as the mucosa was fully examined.    ESOPHAGUS: The mucosa of the esophagus appeared normal.  STOMACH: Mild gastropathy was found in the proximal greater than distal stomach.  This was characterized by edema and erythema. Multiple biopsies were performed using cold forceps.  DUODENUM: Mild duodenal inflammation was found in the duodenal bulb. The duodenal mucosa showed no abnormalities in the 2nd part of the duodenum.  Retroflexed views revealed no other abnormalities.     The scope was then withdrawn from the patient and the procedure completed.  COMPLICATIONS: There were no complications.  ENDOSCOPIC IMPRESSION: 1.   The mucosa of the esophagus appeared normal 2.   Gastropathy was found proximal greater than distal stomach; multiple biopsies 3.   Duodenal inflammation was found in the duodenal bulb 4.   The duodenal mucosa showed no abnormalities in the 2nd part of the duodenum  RECOMMENDATIONS: 1.  Await pathology results 2.  Follow-up of helicobacter pylori status, treat if indicated 3.  Office follow-up in 4-6 weeks  eSigned:  Jerene Bears, MD  09/15/2013 12:43 PM   CC:The Patient and Ronette Deter MD  PATIENT NAME:  [name] [name], [name] [name] MR#: [id number]

## 2013-09-16 ENCOUNTER — Ambulatory Visit (INDEPENDENT_AMBULATORY_CARE_PROVIDER_SITE_OTHER): Payer: Medicare Other | Admitting: Psychology

## 2013-09-16 ENCOUNTER — Ambulatory Visit (INDEPENDENT_AMBULATORY_CARE_PROVIDER_SITE_OTHER): Payer: Medicare Other | Admitting: Internal Medicine

## 2013-09-16 ENCOUNTER — Telehealth: Payer: Self-pay | Admitting: *Deleted

## 2013-09-16 ENCOUNTER — Encounter: Payer: Self-pay | Admitting: Internal Medicine

## 2013-09-16 VITALS — BP 132/80 | HR 72 | Wt 270.0 lb

## 2013-09-16 DIAGNOSIS — I798 Other disorders of arteries, arterioles and capillaries in diseases classified elsewhere: Secondary | ICD-10-CM | POA: Diagnosis not present

## 2013-09-16 DIAGNOSIS — F331 Major depressive disorder, recurrent, moderate: Secondary | ICD-10-CM | POA: Diagnosis not present

## 2013-09-16 DIAGNOSIS — F4310 Post-traumatic stress disorder, unspecified: Secondary | ICD-10-CM

## 2013-09-16 DIAGNOSIS — E1159 Type 2 diabetes mellitus with other circulatory complications: Secondary | ICD-10-CM | POA: Diagnosis not present

## 2013-09-16 LAB — HEMOGLOBIN A1C: Hgb A1c MFr Bld: 9.4 % — ABNORMAL HIGH (ref 4.6–6.5)

## 2013-09-16 MED ORDER — INSULIN ASPART 100 UNIT/ML ~~LOC~~ SOLN: SUBCUTANEOUS | Status: DC

## 2013-09-16 NOTE — Progress Notes (Signed)
Patient ID: [name] [name], male   DOB: 25-May-1956, 58 y.o.   MRN: [id number]  HPI: [name] [name] is a 58 y.o.-year-old male, returning for f/u for DM2, dx 2006, insulin-dependent since 2011, uncontrolled, with complications (CAD - CABG 2012, PVD - s/p Stents, Aortic and carotid stenosis, peripheral neuropathy).  Since last visit, he had the flu.   Last hemoglobin A1c was: Lab Results  Component Value Date   HGBA1C 9.4* 05/18/2013   HGBA1C 8.3* 02/10/2013   HGBA1C 8.6* 11/04/2012   Pt is on a regimen of: - Lantus 60 units bid >> 60 hs at last visit - Novolog 12 units with a regular meal, 14 units with a larger meal >> ends up taking 14 units tid We stopped Victoza at last visit.  Pt checks his sugars 3x a day and they are MUCH improved: - am: [phone] >> 150-200 - 2h after b'fast: 150-300 >> cannot remember - before lunch: n/c  - 2h after lunch: n/c - before dinner: >200 >> cannot - 2h after dinner: n/c  - bedtime: 280->400 >> 210-220 No lows. Lowest sugar was 120; ? At what level he has hypoglycemia awareness. Highest sugar was 400.  He stays thirsty - appears dry today.  Pt's meals are: - Breakfast: toast + eggs or cereals + milk - Lunch: soup + 1/2 sandwich - Dinner: meat + sweet potatoes or pasta + salad - Snacks: 3: sugar free foods, fruit Exercises by walking 2x a day.  - He has CKD, last BUN/creatinine:  Lab Results  Component Value Date   BUN 21 05/28/2013   CREATININE 1.3 05/28/2013  He is on Cozaar. - last set of lipids: Lab Results  Component Value Date   CHOL 122 05/18/2013   HDL 20.00* 05/18/2013   LDLCALC 50 04/25/2012   LDLDIRECT 66.9 05/18/2013   TRIG 224.0* 05/18/2013   CHOLHDL 6 05/18/2013  He  Is on Zocor. - last eye exam was in 04/2013 [facility] - Dr Violet Baldy ? DR. + cataracts. - + numbness and tingling in his feet - Dr Normal Regal  He gave up soft drinks for Malena Edman >> lost 4 lbs.   I reviewed pt's medications, allergies, PMH,  social hx, family hx and no changes required, except as mentioned above.  ROS: Constitutional: + weight  loss , + fatigue, + feeling cold, + nocturia Eyes: no blurry vision, no xerophthalmia ENT: no sore throat, no nodules palpated in throat, + dysphagia/no odynophagia, no hoarseness, + tinnitus, + hypoacusis Cardiovascular: noCP/SOB/palpitations/+ leg swelling Respiratory: no cough/SOB/ + wheezing Gastrointestinal: no N/V/D/+ C Musculoskeletal: + muscle aches/+ bone pain  Skin: no rashes,+ hair loss Neurological: no tremors/no numbness/tingling/dizziness + diff with erections  PE: BP 132/80  Pulse 72  Wt 270 lb (122.471 kg)  SpO2 99% Wt Readings from Last 3 Encounters:  09/16/13 270 lb (122.471 kg)  09/15/13 274 lb (124.286 kg)  09/02/13 274 lb 6 oz (124.456 kg)   Constitutional: obese, in NAD, ruddy complexion Eyes: PERRLA, EOMI, no exophthalmos ENT: dry mucous membranes, no thyromegaly, no cervical lymphadenopathy Cardiovascular: RRR, No MRG Respiratory: CTA B Gastrointestinal: abdomen soft, NT, ND, BS+ Musculoskeletal: no deformities, strength intact in all 4 Skin: moist, warm, + rash - rosacea on face Neurological: no tremor with outstretched hands, DTR normal in all 4  ASSESSMENT: 1. DM2, insulin-dependent, uncontrolled, with complications - CAD - s/p CABG 2012 - PVD - s/p stents  - Aortic stenosis - carotid stenosis - peripheral neuropathy  PLAN:  1. Patient with long-standing, uncontrolled diabetes, on injectable regimen - now with better control after increasing mealtime insulin - I suggested to:  Patient Instructions  Please come back for a follow-up appointment in 1 month. Bring your sugar log.  Continue Lantus 60 units at bedtime. Increase NovoLog to 14 units with a small meal and to 17 units with a larger meal.  Please return in 1.5 month with your sugar log.  - will likely need a SSI at next visit, but did not want to complicate the regimen too much  now - discussed again about better diet choices >> started almond milk >> sugars better - advised to stay hydrated - continue to check 3 times a day, rotating checks - advised for yearly eye exams - will get A1c at this visit - Return to clinic in 1.5 mo with sugar log   Office Visit on 09/16/2013  Component Date Value Ref Range Status  . Hemoglobin A1C 09/16/2013 9.4* 4.6 - 6.5 % Final   Glycemic Control Guidelines for People with Diabetes:Non Diabetic:  <6%Goal of Therapy: <7%Additional Action Suggested:  >8%    HbA1c still high, as expected. Will use this as a starting point.

## 2013-09-16 NOTE — Telephone Encounter (Signed)
  Follow up Call-  Call back number 09/15/2013  Post procedure Call Back phone  # [phone]  Permission to leave phone message Yes     Patient questions:  Do you have a fever, pain , or abdominal swelling? no Pain Score  0 *  Have you tolerated food without any problems? yes  Have you been able to return to your normal activities? yes  Do you have any questions about your discharge instructions: Diet   no Medications  no Follow up visit  no  Do you have questions or concerns about your Care? no  Actions: * If pain score is 4 or above: No action needed, pain <4.

## 2013-09-16 NOTE — Patient Instructions (Addendum)
Please come back for a follow-up appointment in 1 month. Bring your sugar log.  Continue Lantus 60 units at bedtime. Increase NovoLog to 14 units with a small meal and to 17 units with a larger meal.  Please return in 1.5 month with your sugar log.

## 2013-09-16 NOTE — Progress Notes (Signed)
Pre visit review using our clinic review tool, if applicable. No additional management support is needed unless otherwise documented below in the visit note. 

## 2013-09-17 ENCOUNTER — Ambulatory Visit: Payer: Medicare Other | Admitting: Cardiovascular Disease

## 2013-09-21 ENCOUNTER — Ambulatory Visit: Payer: Medicare Other | Admitting: Cardiovascular Disease

## 2013-09-23 ENCOUNTER — Ambulatory Visit (AMBULATORY_SURGERY_CENTER): Payer: Medicare Other | Admitting: *Deleted

## 2013-09-23 ENCOUNTER — Ambulatory Visit (INDEPENDENT_AMBULATORY_CARE_PROVIDER_SITE_OTHER): Payer: Medicare Other | Admitting: Psychology

## 2013-09-23 ENCOUNTER — Encounter: Payer: Self-pay | Admitting: Internal Medicine

## 2013-09-23 VITALS — Ht 67.0 in | Wt 278.2 lb

## 2013-09-23 DIAGNOSIS — F449 Dissociative and conversion disorder, unspecified: Secondary | ICD-10-CM | POA: Diagnosis not present

## 2013-09-23 DIAGNOSIS — F331 Major depressive disorder, recurrent, moderate: Secondary | ICD-10-CM | POA: Diagnosis not present

## 2013-09-23 DIAGNOSIS — Z86010 Personal history of colonic polyps: Secondary | ICD-10-CM

## 2013-09-23 MED ORDER — PEG 3350-KCL-NA BICARB-NACL 420 G PO SOLR: 4000.0000 mL | Freq: Once | ORAL | Status: DC

## 2013-09-23 NOTE — Progress Notes (Signed)
No allergies to eggs or soy. No problems with anesthesia.  

## 2013-09-24 ENCOUNTER — Telehealth: Payer: Self-pay | Admitting: Internal Medicine

## 2013-09-24 ENCOUNTER — Other Ambulatory Visit: Payer: Self-pay

## 2013-09-24 MED ORDER — OMEPRAZOLE 20 MG PO CPDR: 20.0000 mg | DELAYED_RELEASE_CAPSULE | Freq: Two times a day (BID) | ORAL | Status: DC

## 2013-09-24 MED ORDER — BIS SUBCIT-METRONID-TETRACYC 140-125-125 MG PO CAPS: 3.0000 | ORAL_CAPSULE | Freq: Three times a day (TID) | ORAL | Status: DC

## 2013-09-25 ENCOUNTER — Encounter: Payer: Self-pay | Admitting: Cardiovascular Disease

## 2013-09-25 ENCOUNTER — Ambulatory Visit (INDEPENDENT_AMBULATORY_CARE_PROVIDER_SITE_OTHER): Payer: Medicare Other | Admitting: Cardiovascular Disease

## 2013-09-25 VITALS — BP 132/87 | HR 73 | Ht 66.0 in | Wt 275.0 lb

## 2013-09-25 DIAGNOSIS — I359 Nonrheumatic aortic valve disorder, unspecified: Secondary | ICD-10-CM | POA: Diagnosis not present

## 2013-09-25 DIAGNOSIS — I350 Nonrheumatic aortic (valve) stenosis: Secondary | ICD-10-CM

## 2013-09-25 DIAGNOSIS — R079 Chest pain, unspecified: Secondary | ICD-10-CM

## 2013-09-25 DIAGNOSIS — I2510 Atherosclerotic heart disease of native coronary artery without angina pectoris: Secondary | ICD-10-CM

## 2013-09-25 DIAGNOSIS — E785 Hyperlipidemia, unspecified: Secondary | ICD-10-CM | POA: Diagnosis not present

## 2013-09-25 DIAGNOSIS — I10 Essential (primary) hypertension: Secondary | ICD-10-CM

## 2013-09-25 NOTE — Assessment & Plan Note (Signed)
He is known to have mild aortic stenosis with most recent echocardiogram in 2013. I will plan on obtaining a followup echocardiogram in late 2015.

## 2013-09-25 NOTE — Patient Instructions (Signed)
Continue same medications.   Your physician wants you to follow-up in: 6 months.  You will receive a reminder letter in the mail two months in advance. If you don't receive a letter, please call our office to schedule the follow-up appointment.  

## 2013-09-25 NOTE — Assessment & Plan Note (Signed)
Lab Results  Component Value Date   CHOL 122 05/18/2013   HDL 20.00* 05/18/2013   LDLCALC 50 04/25/2012   LDLDIRECT 66.9 05/18/2013   TRIG 224.0* 05/18/2013   CHOLHDL 6 05/18/2013   Continue treatment with simvastatin. LDL is at target. HDL and triglycerides are not and hopefully can improve with more physical activities.

## 2013-09-25 NOTE — Assessment & Plan Note (Signed)
Blood pressure is well controlled on medications. 

## 2013-09-25 NOTE — Assessment & Plan Note (Addendum)
He is stable from a cardiac standpoint with no symptoms suggestive of worsening angina. Continue medical therapy. I advised him to start exercising. Given his significant arthritis, water aerobics might be the best option for him.

## 2013-09-25 NOTE — Progress Notes (Signed)
HPI  This is a 58 year old male who is here today for a followup visit. He has known history of coronary artery disease status post drug-eluting stent placements to the LAD in 2011. Repeat cardiac catheterization in 2012 showed significant restenosis with progression of three-vessel disease. He underwent coronary artery bypass graft surgery at that time. He also had left carotid endarterectomy at the same time. He has multiple medical conditions that include type 2 diabetes, hypertension, hyperlipidemia, mild aortic stenosis and peripheral arterial disease which is being followed by Dr. Ronalee Belts. Most recent stress test was done in July of 2013 which showed no evidence of ischemia. Echocardiogram in July 2013 showed normal LV systolic function with mild aortic stenosis. He has been stable from a cardiac standpoint. He gets occasional and not frequent episodes of brief chest pain. No worsening dyspnea. He continues to complain of fatigue. He does not do any regular exercise. He is limited by arthritis.  Allergies  Allergen Reactions  . Contrast Media [Iodinated Diagnostic Agents]     Got very hot and red  . Glipizide Other (See Comments)    ANTIDIABETICS. Burning  . Penicillins Hives and Swelling     Current Outpatient Prescriptions on File Prior to Visit  Medication Sig Dispense Refill  . amitriptyline (ELAVIL) 25 MG tablet       . BAYER MICROLET LANCETS lancets use three times a day  100 each  12  . bismuth-metronidazole-tetracycline (PYLERA) 140-125-125 MG per capsule Take 3 capsules by mouth 4 (four) times daily -  before meals and at bedtime.  120 capsule  0  . buPROPion (WELLBUTRIN XL) 150 MG 24 hr tablet take 1 tablet by mouth once daily  30 tablet  6  . cholecalciferol (VITAMIN D) 1000 UNITS tablet Take 1,000 Units by mouth daily.        . Cinnamon 500 MG capsule Take 1,000 mg by mouth daily.      . clopidogrel (PLAVIX) 75 MG tablet Take 1 tablet (75 mg total) by mouth daily.  90  tablet  4  . cyclobenzaprine (FLEXERIL) 10 MG tablet Take 10 mg by mouth 2 (two) times daily as needed.       . diazepam (VALIUM) 5 MG tablet Take 1 tablet (5 mg total) by mouth at bedtime as needed for anxiety.  30 tablet  4  . Glucosamine-Chondroit-Vit C-Mn (GLUCOSAMINE CHONDR 500 COMPLEX) CAPS Take 1,000 each by mouth daily.      [name] glucose blood test strip Use as directed  100 each  11  . insulin aspart (NOVOLOG) 100 UNIT/ML injection Inject under skin up to 60 units daily - pens please  30 mL  5  . Insulin Glargine (LANTUS SOLOSTAR) 100 UNIT/ML Solostar Pen INJECT UNDER THE SKIN 60 UNITS DAILY.  12 mL  11  . Lactulose 20 GM/30ML SOLN Take 30 mLs by mouth every 2 (two) hours as needed.      Elmore Guise Device MISC 1 application by Does not apply route as needed.  100 each  11  . losartan (COZAAR) 25 MG tablet Take 1 tablet (25 mg total) by mouth daily.  90 tablet  3  . magnesium gluconate (MAGONATE) 500 MG tablet Take 1,000 mg by mouth daily.      . metoprolol succinate (TOPROL-XL) 50 MG 24 hr tablet take 1 tablet by mouth twice a day  60 tablet  6  . Multiple Vitamin (MULTIVITAMIN) capsule Take 1 capsule by mouth daily.        [name]  nitazoxanide (ALINIA) 500 MG tablet Take 1 tablet (500 mg total) by mouth 2 (two) times daily with a meal.  20 tablet  0  . nitroGLYCERIN (NITROSTAT) 0.4 MG SL tablet Place 1 tablet (0.4 mg total) under the tongue every 5 (five) minutes as needed.  25 tablet  1  . NOVOFINE 32G X 6 MM MISC TEST ONCE A DAY.  100 each  3  . omeprazole (PRILOSEC) 20 MG capsule Take 1 capsule (20 mg total) by mouth 2 (two) times daily.  20 capsule  0  . oxyCODONE (OXYCONTIN) 10 MG 12 hr tablet Take 10 mg by mouth every 12 (twelve) hours as needed.      [name] oxyCODONE-acetaminophen (PERCOCET/ROXICET) 5-325 MG per tablet Take 2 tablets by mouth every 8 (eight) hours as needed.  90 tablet  0  . polyethylene glycol-electrolytes (NULYTELY/GOLYTELY) 420 G solution Take 4,000 mLs by mouth once.  4000 mL   0  . sertraline (ZOLOFT) 50 MG tablet Take 50 mg by mouth daily.       . simvastatin (ZOCOR) 20 MG tablet Take 1 tablet (20 mg total) by mouth at bedtime.  90 tablet  1  . tamsulosin (FLOMAX) 0.4 MG CAPS capsule take 1 capsule by mouth every morning  30 capsule  5   No current facility-administered medications on file prior to visit.     Past Medical History  Diagnosis Date  . Peripheral vascular disease   . Diabetes mellitus   . Mild aortic stenosis   . Tobacco use disorder     recently quit  . Depression   . Psoriasis   . Neuropathy   . Obesity   . Post splenectomy syndrome   . SOB (shortness of breath)   . Carotid artery occlusion   . Coronary artery disease   . Hyperlipidemia   . Hypertension   . Bell's palsy 2007  . Sleep apnea   . Angina at rest     chronic  . Abnormal colonoscopy 06/2011    Dr. Minna Merritts, polyp  . Cataract     Dr. Dawna Part  . Anxiety   . Arthritis   . DVT (deep venous thrombosis)     in leg  . Stroke   . Allergy   . Heart murmur      Past Surgical History  Procedure Laterality Date  . Splenectomy    . Heart stents  Jan 2011    leg stents 06/2009 and 03/2010  . Coronary artery bypass graft  09/06/2010    At [facility]: LIMA to LAD, left radial to RCA, sequential SVG to OM3 and 4  . Cardiac catheterization  08/2010    LAD: 80% ISR, RCA: 80% ostial, OM 80-90%  . Coronary angioplasty      LAD: before CABG  . Carotid endarterectomy  08/2010    left/ Dr. Kellie Simmering  . Pta of illiac and sfa  multiple    Dr. Ronalee Belts, s/p revision 11/2012  . Colonoscopy       Family History  Problem Relation Age of Onset  . Lung cancer Father 19  . Breast cancer Sister 57  . Diabetes Paternal Grandfather   . Colon polyps Paternal Grandfather 65  . Dementia Mother   . Alzheimer's disease Mother      History   Social History  . Marital Status: Married    Spouse Name: N/A    Number of Children: 0  . Years of Education: N/A   Occupational History  .  Social History Main Topics  . Smoking status: Current Some Day Smoker -- 0.30 packs/day for 40 years    Types: Cigarettes  . Smokeless tobacco: Never Used     Comment: smokes 2-3 cigarettes a week  . Alcohol Use: No  . Drug Use: No  . Sexual Activity: Not on file   Other Topics Concern  . Not on file   Social History Narrative  . No narrative on file     PHYSICAL EXAM   BP 132/87  Pulse 73  Ht 5\' 6"  (1.676 m)  Wt 275 lb (124.739 kg)  BMI 44.41 kg/m2 Constitutional: He is oriented to person, place, and time. He appears well-developed and well-nourished. No distress.  HENT: No nasal discharge.  Head: Normocephalic and atraumatic.  Eyes: Pupils are equal and round. Right eye exhibits no discharge. Left eye exhibits no discharge.  Neck: Normal range of motion. Neck supple. No JVD present. No thyromegaly present.  Cardiovascular: Normal rate, regular rhythm, diminished S2. Exam reveals no gallop and no friction rub. There is a 2/6 systolic ejection murmur at the aortic area. Pulmonary/Chest: Effort normal and breath sounds normal. No stridor. No respiratory distress. He has no wheezes. He has no rales. He exhibits no tenderness.  Abdominal: Soft. Bowel sounds are normal. He exhibits no distension. There is no tenderness. There is no rebound and no guarding.  Musculoskeletal: Normal range of motion. He exhibits trace edema and no tenderness.  Neurological: He is alert and oriented to person, place, and time. Coordination normal.  Skin: Skin is warm and dry. There is stasis dermatitis in the lower extremities. He is not diaphoretic. No erythema. No pallor.  Psychiatric: He has a normal mood and affect. His behavior is normal. Judgment and thought content normal.       EKG: Sinus  Rhythm  Low voltage in precordial leads.   ABNORMAL    ASSESSMENT AND PLAN

## 2013-09-28 ENCOUNTER — Ambulatory Visit (AMBULATORY_SURGERY_CENTER): Payer: Medicare Other | Admitting: Internal Medicine

## 2013-09-28 ENCOUNTER — Telehealth: Payer: Self-pay | Admitting: Gastroenterology

## 2013-09-28 ENCOUNTER — Encounter: Payer: Self-pay | Admitting: Internal Medicine

## 2013-09-28 VITALS — BP 112/62 | HR 84 | Temp 97.3°F | Resp 21 | Ht 66.0 in | Wt 275.0 lb

## 2013-09-28 DIAGNOSIS — I10 Essential (primary) hypertension: Secondary | ICD-10-CM | POA: Diagnosis not present

## 2013-09-28 DIAGNOSIS — E669 Obesity, unspecified: Secondary | ICD-10-CM | POA: Diagnosis not present

## 2013-09-28 DIAGNOSIS — I2510 Atherosclerotic heart disease of native coronary artery without angina pectoris: Secondary | ICD-10-CM | POA: Diagnosis not present

## 2013-09-28 DIAGNOSIS — D126 Benign neoplasm of colon, unspecified: Secondary | ICD-10-CM | POA: Diagnosis not present

## 2013-09-28 DIAGNOSIS — Z86010 Personal history of colonic polyps: Secondary | ICD-10-CM | POA: Diagnosis not present

## 2013-09-28 DIAGNOSIS — I6529 Occlusion and stenosis of unspecified carotid artery: Secondary | ICD-10-CM | POA: Diagnosis not present

## 2013-09-28 DIAGNOSIS — F341 Dysthymic disorder: Secondary | ICD-10-CM | POA: Diagnosis not present

## 2013-09-28 DIAGNOSIS — Z1211 Encounter for screening for malignant neoplasm of colon: Secondary | ICD-10-CM | POA: Diagnosis not present

## 2013-09-28 DIAGNOSIS — G4733 Obstructive sleep apnea (adult) (pediatric): Secondary | ICD-10-CM | POA: Diagnosis not present

## 2013-09-28 LAB — GLUCOSE, CAPILLARY
Glucose-Capillary: 243 mg/dL — ABNORMAL HIGH (ref 70–99)
Glucose-Capillary: 227 mg/dL — ABNORMAL HIGH (ref 70–99)

## 2013-09-28 MED ORDER — SODIUM CHLORIDE 0.9 % IV SOLN: 500.0000 mL | INTRAVENOUS | Status: DC

## 2013-09-28 NOTE — Progress Notes (Signed)
Report to pacu rn, vss, bbs=clear 

## 2013-09-28 NOTE — Telephone Encounter (Signed)
Pt was given samples of Pylera at colonoscopy visit

## 2013-09-28 NOTE — Progress Notes (Signed)
Called to room to assist during endoscopic procedure.  Patient ID and intended procedure confirmed with present staff. Received instructions for my participation in the procedure from the performing physician.  

## 2013-09-28 NOTE — Patient Instructions (Addendum)
YOU HAD AN ENDOSCOPIC PROCEDURE TODAY AT THE [facility]: Refer to the procedure report that was given to you for any specific questions about what was found during the examination.  If the procedure report does not answer your questions, please call your gastroenterologist to clarify.  If you requested that your care partner not be given the details of your procedure findings, then the procedure report has been included in a sealed envelope for you to review at your convenience later.  YOU SHOULD EXPECT: Some feelings of bloating in the abdomen. Passage of more gas than usual.  Walking can help get rid of the air that was put into your GI tract during the procedure and reduce the bloating. If you had a lower endoscopy (such as a colonoscopy or flexible sigmoidoscopy) you may notice spotting of blood in your stool or on the toilet paper. If you underwent a bowel prep for your procedure, then you may not have a normal bowel movement for a few days.  DIET: Your first meal following the procedure should be a light meal and then it is ok to progress to your normal diet.  A half-sandwich or bowl of soup is an example of a good first meal.  Heavy or fried foods are harder to digest and may make you feel nauseous or bloated.  Likewise meals heavy in dairy and vegetables can cause extra gas to form and this can also increase the bloating.  Drink plenty of fluids but you should avoid alcoholic beverages for 24 hours.  ACTIVITY: Your care partner should take you home directly after the procedure.  You should plan to take it easy, moving slowly for the rest of the day.  You can resume normal activity the day after the procedure however you should NOT DRIVE or use heavy machinery for 24 hours (because of the sedation medicines used during the test).    SYMPTOMS TO REPORT IMMEDIATELY: A gastroenterologist can be reached at any hour.  During normal business hours, 8:30 AM to 5:00 PM Monday through Friday,  call [phone].  After hours and on weekends, please call the GI answering service at [phone] who will take a message and have the physician on call contact you.   Following lower endoscopy (colonoscopy or flexible sigmoidoscopy):  Excessive amounts of blood in the stool  Significant tenderness or worsening of abdominal pains  Swelling of the abdomen that is new, acute  Fever of 100F or higher  FOLLOW UP: If any biopsies were taken you will be contacted by phone or by letter within the next 1-3 weeks.  Call your gastroenterologist if you have not heard about the biopsies in 3 weeks.  Our staff will call the home number listed on your records the next business day following your procedure to check on you and address any questions or concerns that you may have at that time regarding the information given to you following your procedure. This is a courtesy call and so if there is no answer at the home number and we have not heard from you through the emergency physician on call, we will assume that you have returned to your regular daily activities without incident.  SIGNATURES/CONFIDENTIALITY: You and/or your care partner have signed paperwork which will be entered into your electronic medical record.  These signatures attest to the fact that that the information above on your After Visit Summary has been reviewed and is understood.  Full responsibility of the confidentiality of this   discharge information lies with you and/or your care-partner.  Polyps, diverticulosis, high fiber diet-handouts given  Hold aspirin products and anti-inflammatory (ibuprofen, mortin, aleve, mobic, etc) medications for 2 weeks.   Repeat colonoscopy in 1 year.  Hold plavix another 2 days can restart on Wednesday 09/30/13.

## 2013-09-28 NOTE — Op Note (Signed)
[place]  [address] [place], [id number]   COLONOSCOPY PROCEDURE REPORT  PATIENT: [name] [name]  MR#: [id number] BIRTHDATE: Jun 27, 1956 , 58  yrs. old GENDER: Male ENDOSCOPIST: Jerene Bears, MD REFERRED [id number] Gilford Rile, M.D. PROCEDURE DATE:  09/28/2013 PROCEDURE:   Colonoscopy with snare polypectomy and Colonoscopy with cold biopsy polypectomy First Screening Colonoscopy - Avg.  risk and is 50 yrs.  old or older - No.  Prior Negative Screening - Now for repeat screening. N/A  History of Adenoma - Now for follow-up colonoscopy & has been > or = to 3 yrs.  Yes hx of adenoma.  Has been 3 or more years since last colonoscopy.  Polyps Removed Today? Yes. ASA CLASS:   Class III INDICATIONS:elevated risk screening and Patient's personal history of adenomatous colon polyps. MEDICATIONS: MAC sedation, administered by CRNA and Propofol (Diprivan) 480 mg IV  DESCRIPTION OF PROCEDURE:   After the risks benefits and alternatives of the procedure were thoroughly explained, informed consent was obtained.  A digital rectal exam revealed no rectal mass.   The LB [id number]  endoscope was introduced through the anus and advanced to the cecum, which was identified by both the appendix and ileocecal valve. No adverse events experienced. The quality of the prep was fair only after copious irrigation and lavage with 2 day prep using GoLYTELY.  The instrument was then slowly withdrawn as the colon was fully examined.      COLON FINDINGS: Three sessile polyps ranging between 3-13mm in size were found in the ascending colon.  Polypectomy was performed with cold forceps (1) and using cold snare (2).  All resections were complete and all polyp tissue was completely retrieved.   A sessile polyp measuring 5 mm in size was found at the hepatic flexure.  A polypectomy was performed with a cold snare.  The resection was complete and the polyp tissue was completely  retrieved.   Four sessile polyps measuring 5-15 mm in size were found in the transverse colon.  Polypectomy was performed using cold snare (2) and using hot snare (2).  All resections were complete and all polyp tissue was completely retrieved.   Four sessile polyps measuring 5-20 mm in size were found in the descending colon (1) and sigmoid colon (3).  Polypectomy was performed using cold snare (2)and using hot snare (2).  All resections were complete and all polyp tissue was completely retrieved.   Mild diverticulosis was noted The finding was in the left colon.  Retroflexed views revealed no abnormalities. The time to cecum=8 minutes 15 seconds. Withdrawal time=47 minutes 19 seconds.  The scope was withdrawn and the procedure completed.  COMPLICATIONS: There were no complications.  ENDOSCOPIC IMPRESSION: 1.   Three sessile polyps ranging between 3-46mm in size were found in the ascending colon; Polypectomy was performed with cold forceps and using cold snare 2.   Sessile polyp measuring 5 mm in size was found at the hepatic flexure; polypectomy was performed with a cold snare 3.   Four sessile polyps measuring 5-15 mm in size were found in the transverse colon; Polypectomy was performed using cold snare and using hot snare 4.   Four sessile polyps measuring 5-20 mm in size were found in the descending colon and sigmoid colon; Polypectomy was performed using cold snare and using hot snare 5.   Mild diverticulosis was noted in the left colon  RECOMMENDATIONS: 1.  Await pathology results 2.  High fiber diet 3.  Hold aspirin,  aspirin products, and anti-inflammatory medication for 2 weeks. 4.  Timing of repeat colonoscopy will be determined by pathology findings. 5.  You will receive a letter within 1-2 weeks with the results of your biopsy as well as final recommendations.  Please call my office if you have not received a letter after 3 weeks. 6.  Would hold Plavix an additional  48 hours given polypectomies today   eSigned:  Jerene Bears, MD 09/28/2013 9:22 AM   cc: The Patient and Ronette Deter MD   PATIENT NAME:  [name], [name] MR#: [id number]

## 2013-09-29 ENCOUNTER — Telehealth: Payer: Self-pay | Admitting: *Deleted

## 2013-09-29 NOTE — Telephone Encounter (Signed)
  Follow up Call-  Call back number 09/28/2013 09/15/2013  Post procedure Call Back phone  # [phone]  Permission to leave phone message Yes Yes     Patient questions:  Do you have a fever, pain , or abdominal swelling? no Pain Score  0 *  Have you tolerated food without any problems? yes  Have you been able to return to your normal activities? yes  Do you have any questions about your discharge instructions: Diet   no Medications  no Follow up visit  no  Do you have questions or concerns about your Care? no  Actions: * If pain score is 4 or above: No action needed, pain <4.

## 2013-09-30 ENCOUNTER — Ambulatory Visit (INDEPENDENT_AMBULATORY_CARE_PROVIDER_SITE_OTHER): Payer: Medicare Other | Admitting: Psychology

## 2013-09-30 DIAGNOSIS — F449 Dissociative and conversion disorder, unspecified: Secondary | ICD-10-CM

## 2013-09-30 DIAGNOSIS — F331 Major depressive disorder, recurrent, moderate: Secondary | ICD-10-CM

## 2013-10-04 ENCOUNTER — Other Ambulatory Visit: Payer: Self-pay | Admitting: Internal Medicine

## 2013-10-05 ENCOUNTER — Encounter: Payer: Self-pay | Admitting: Internal Medicine

## 2013-10-07 ENCOUNTER — Ambulatory Visit (INDEPENDENT_AMBULATORY_CARE_PROVIDER_SITE_OTHER): Payer: Medicare Other | Admitting: Psychology

## 2013-10-07 DIAGNOSIS — F331 Major depressive disorder, recurrent, moderate: Secondary | ICD-10-CM

## 2013-10-07 DIAGNOSIS — F449 Dissociative and conversion disorder, unspecified: Secondary | ICD-10-CM

## 2013-10-08 DIAGNOSIS — F17200 Nicotine dependence, unspecified, uncomplicated: Secondary | ICD-10-CM | POA: Diagnosis not present

## 2013-10-08 DIAGNOSIS — I70219 Atherosclerosis of native arteries of extremities with intermittent claudication, unspecified extremity: Secondary | ICD-10-CM | POA: Diagnosis not present

## 2013-10-08 DIAGNOSIS — I10 Essential (primary) hypertension: Secondary | ICD-10-CM | POA: Diagnosis not present

## 2013-10-08 DIAGNOSIS — I6529 Occlusion and stenosis of unspecified carotid artery: Secondary | ICD-10-CM | POA: Diagnosis not present

## 2013-10-09 DIAGNOSIS — M2240 Chondromalacia patellae, unspecified knee: Secondary | ICD-10-CM | POA: Diagnosis not present

## 2013-10-09 DIAGNOSIS — Z23 Encounter for immunization: Secondary | ICD-10-CM | POA: Diagnosis not present

## 2013-10-12 ENCOUNTER — Other Ambulatory Visit: Payer: Self-pay | Admitting: Internal Medicine

## 2013-10-13 ENCOUNTER — Encounter: Payer: Medicare Other | Admitting: Internal Medicine

## 2013-10-13 NOTE — Telephone Encounter (Signed)
Last OV 2 /4/15 ok to fill?

## 2013-10-14 ENCOUNTER — Emergency Department: Payer: Self-pay | Admitting: Emergency Medicine

## 2013-10-14 ENCOUNTER — Ambulatory Visit (INDEPENDENT_AMBULATORY_CARE_PROVIDER_SITE_OTHER): Payer: Medicare Other | Admitting: Psychology

## 2013-10-14 DIAGNOSIS — I10 Essential (primary) hypertension: Secondary | ICD-10-CM | POA: Diagnosis not present

## 2013-10-14 DIAGNOSIS — E119 Type 2 diabetes mellitus without complications: Secondary | ICD-10-CM | POA: Diagnosis not present

## 2013-10-14 DIAGNOSIS — F4310 Post-traumatic stress disorder, unspecified: Secondary | ICD-10-CM

## 2013-10-14 DIAGNOSIS — Z7982 Long term (current) use of aspirin: Secondary | ICD-10-CM | POA: Diagnosis not present

## 2013-10-14 DIAGNOSIS — Z880 Allergy status to penicillin: Secondary | ICD-10-CM | POA: Diagnosis not present

## 2013-10-14 DIAGNOSIS — R079 Chest pain, unspecified: Secondary | ICD-10-CM | POA: Diagnosis not present

## 2013-10-14 DIAGNOSIS — Z79899 Other long term (current) drug therapy: Secondary | ICD-10-CM | POA: Diagnosis not present

## 2013-10-14 DIAGNOSIS — Z951 Presence of aortocoronary bypass graft: Secondary | ICD-10-CM | POA: Diagnosis not present

## 2013-10-14 DIAGNOSIS — R52 Pain, unspecified: Secondary | ICD-10-CM | POA: Diagnosis not present

## 2013-10-14 DIAGNOSIS — Z794 Long term (current) use of insulin: Secondary | ICD-10-CM | POA: Diagnosis not present

## 2013-10-14 DIAGNOSIS — Z8673 Personal history of transient ischemic attack (TIA), and cerebral infarction without residual deficits: Secondary | ICD-10-CM | POA: Diagnosis not present

## 2013-10-14 DIAGNOSIS — Z888 Allergy status to other drugs, medicaments and biological substances status: Secondary | ICD-10-CM | POA: Diagnosis not present

## 2013-10-14 LAB — BASIC METABOLIC PANEL
Anion Gap: 5 — ABNORMAL LOW (ref 7–16)
BUN: 17 mg/dL (ref 7–18)
Calcium, Total: 8.2 mg/dL — ABNORMAL LOW (ref 8.5–10.1)
Chloride: 98 mmol/L (ref 98–107)
Co2: 31 mmol/L (ref 21–32)
Creatinine: 1.59 mg/dL — ABNORMAL HIGH (ref 0.60–1.30)
EGFR (African American): 54 — ABNORMAL LOW
EGFR (Non-African Amer.): 46 — ABNORMAL LOW
Glucose: 352 mg/dL — ABNORMAL HIGH (ref 65–99)
Osmolality: 284 (ref 275–301)
Potassium: 4.1 mmol/L (ref 3.5–5.1)
Sodium: 134 mmol/L — ABNORMAL LOW (ref 136–145)

## 2013-10-14 LAB — CBC
HCT: 36.9 % — ABNORMAL LOW (ref 40.0–52.0)
HGB: 12.1 g/dL — ABNORMAL LOW (ref 13.0–18.0)
MCH: 30 pg (ref 26.0–34.0)
MCHC: 32.9 g/dL (ref 32.0–36.0)
MCV: 91 fL (ref 80–100)
Platelet: 178 10*3/uL (ref 150–440)
RBC: 4.05 10*6/uL — ABNORMAL LOW (ref 4.40–5.90)
RDW: 15.9 % — ABNORMAL HIGH (ref 11.5–14.5)
WBC: 9 10*3/uL (ref 3.8–10.6)

## 2013-10-14 LAB — TROPONIN I: Troponin-I: <0.02 ng/mL

## 2013-10-15 LAB — TROPONIN I: Troponin-I: <0.02 ng/mL

## 2013-10-21 ENCOUNTER — Encounter: Payer: Self-pay | Admitting: Internal Medicine

## 2013-10-21 ENCOUNTER — Ambulatory Visit: Payer: Medicare Other | Admitting: Psychology

## 2013-10-28 ENCOUNTER — Ambulatory Visit (INDEPENDENT_AMBULATORY_CARE_PROVIDER_SITE_OTHER): Payer: Medicare Other | Admitting: Psychology

## 2013-10-28 DIAGNOSIS — F331 Major depressive disorder, recurrent, moderate: Secondary | ICD-10-CM | POA: Diagnosis not present

## 2013-10-28 DIAGNOSIS — F449 Dissociative and conversion disorder, unspecified: Secondary | ICD-10-CM | POA: Diagnosis not present

## 2013-10-30 ENCOUNTER — Ambulatory Visit (INDEPENDENT_AMBULATORY_CARE_PROVIDER_SITE_OTHER): Payer: Medicare Other | Admitting: Internal Medicine

## 2013-10-30 ENCOUNTER — Encounter: Payer: Self-pay | Admitting: Internal Medicine

## 2013-10-30 VITALS — BP 140/80 | HR 68 | Ht 67.72 in | Wt 276.4 lb

## 2013-10-30 DIAGNOSIS — D126 Benign neoplasm of colon, unspecified: Secondary | ICD-10-CM

## 2013-10-30 DIAGNOSIS — K5900 Constipation, unspecified: Secondary | ICD-10-CM

## 2013-10-30 DIAGNOSIS — Z8619 Personal history of other infectious and parasitic diseases: Secondary | ICD-10-CM | POA: Insufficient documentation

## 2013-10-30 DIAGNOSIS — I2510 Atherosclerotic heart disease of native coronary artery without angina pectoris: Secondary | ICD-10-CM

## 2013-10-30 MED ORDER — POLYETHYLENE GLYCOL 3350 17 G PO PACK: 17.0000 g | PACK | Freq: Every day | ORAL | Status: DC

## 2013-10-30 NOTE — Progress Notes (Signed)
   Subjective:    Patient ID: [name] [name], male    DOB: 02/23/1956, 58 y.o.   MRN: [id number]  HPI [name] [name] is a 58 year old male with a past medical history of CAD status post CABG and carotid endarterectomy, diabetes, mild aortic stenosis, obesity, hypertension, hyperlipidemia, sleep apnea, adenomatous colon polyps and H. pylori is seen for followup. He was initially seen several months ago to evaluate eructation.  EGD and colonoscopy were performed.  EGD was performed on 09/15/2013 which revealed antral gastropathy and duodenitis. Biopsies were positive for H. pylori but no metaplasia, dysplasia or malignancy.  She took Pylera and completed this therapy. He then had colonoscopy on 09/28/2013 which revealed 3 polyps in the ascending colon, polyp at the hepatic flexure, 4 polyps in the transverse colon, 4 polyps in the sigmoid and descending colon. These polyps were removed and showed tubular adenomas.there is no evidence of high-grade dysplasia or malignancy.  Today he reports he is feeling well. His belching and eructation has improved after H. pylori therapy. He denies complaints other than he has developed some mild constipation. He has not used laxatives for this to this point. He is having a bowel movement every 5-10 days. His swallowing has improved somewhat though he still intermittently has trouble swallowing pills. He denies solid or liquid food dysphagia. No odynophagia. No abdominal pain. No hepatobiliary complaints. No rectal bleeding or melena.  Review of Systems As per HPI, otherwise negative  Current Medications, Allergies, Past Medical History, Past Surgical History, Family History and Social History were reviewed in [employer] record.     Objective:   Physical Exam BP 140/80  Pulse 68  Ht 5' 7.72" (1.72 m)  Wt 276 lb 6 oz (125.363 kg)  BMI 42.38 kg/m2 Constitutional: Well-developed and well-nourished. No distress. HEENT: Normocephalic and  atraumatic.   No scleral icterus. Cardiovascular: Normal rate, regular rhythm and intact distal pulses.  Pulmonary/chest: Effort normal and breath sounds normal. No wheezing, rales or rhonchi. Abdominal: Soft, nontender, nondistended. Bowel sounds active throughout.  Extremities: no clubbing, cyanosis, or edema Neurological: Alert and oriented to person place and time. Psychiatric: Normal mood and affect. Behavior is normal.      Assessment & Plan:   58 year old male with a past medical history of CAD status post CABG and carotid endarterectomy, diabetes, mild aortic stenosis, obesity, hypertension, hyperlipidemia, sleep apnea, adenomatous colon polyps and H. pylori is seen for followup.  1.  Constipation -- likely opioid-related. I have recommended MiraLax 17 g daily. If his stools are too loose he can take this every other day but we discussed how he would need this on a scheduled basis to provide benefit. I asked that he notify me if this fails to improve his constipation. He voices understanding  2.  Adenomatous colon polyp -- multiple adenomatous colon polyps removed at colonoscopy in March 2015. Due to the number of adenomatous polyps removed, I recommend repeating the colonoscopy in one year, and March 2016 with a 2 day prep. He is aware of this recommendation. Anticoagulation and antiplatelets will need to be held for this procedure under the advice of his prescribing physician/cardiologist  3.  H. pylori gastritis -- status post treatment with Pylera. I have recommended H. pylori stool antibody test to confirm eradication  Followup in the office as needed, repeat colonoscopy March 2016 as above

## 2013-10-30 NOTE — Patient Instructions (Addendum)
You have been given a separate informational sheet regarding your tobacco use, the importance of quitting and local resources to help you quit.  Please stop down in our lab in 1 month for an H. Pylori stool antigen test.   We have sent the following medications to your pharmacy for you to pick up at your convenience: Miralax 17 g daily

## 2013-11-03 ENCOUNTER — Telehealth: Payer: Self-pay | Admitting: *Deleted

## 2013-11-03 DIAGNOSIS — N509 Disorder of male genital organs, unspecified: Secondary | ICD-10-CM | POA: Diagnosis not present

## 2013-11-03 MED ORDER — INSULIN PEN NEEDLE 32G X 6 MM MISC: Status: DC

## 2013-11-03 MED ORDER — GLUCOSE BLOOD VI STRP: ORAL_STRIP | Status: DC

## 2013-11-03 NOTE — Telephone Encounter (Signed)
Refill Request  Novofine 32G needles  Use three times a day (last Rx written for 1 daily but patient uses 3 - Authorize new Rx)

## 2013-11-03 NOTE — Telephone Encounter (Signed)
Refill Request  Contour test strip

## 2013-11-04 ENCOUNTER — Ambulatory Visit: Payer: Medicare Other | Admitting: Psychology

## 2013-11-04 ENCOUNTER — Other Ambulatory Visit: Payer: Self-pay | Admitting: Internal Medicine

## 2013-11-05 ENCOUNTER — Other Ambulatory Visit: Payer: Self-pay | Admitting: Internal Medicine

## 2013-11-09 ENCOUNTER — Telehealth: Payer: Self-pay | Admitting: Internal Medicine

## 2013-11-09 NOTE — Telephone Encounter (Signed)
Pt called to give reason for his vm left earlier.  States he needs Dr. Gilford Rile to send rx to [place] for testing strips for diabetes.

## 2013-11-09 NOTE — Telephone Encounter (Signed)
Pt left vm asking for call.  No details given on vm.  LMTCB.

## 2013-11-10 MED ORDER — GLUCOSE BLOOD VI STRP: ORAL_STRIP | Status: DC

## 2013-11-10 NOTE — Telephone Encounter (Signed)
Prescription has been sent to pharmacy.

## 2013-11-11 ENCOUNTER — Ambulatory Visit (INDEPENDENT_AMBULATORY_CARE_PROVIDER_SITE_OTHER): Payer: Medicare Other | Admitting: Psychology

## 2013-11-11 DIAGNOSIS — F331 Major depressive disorder, recurrent, moderate: Secondary | ICD-10-CM | POA: Diagnosis not present

## 2013-11-11 DIAGNOSIS — F449 Dissociative and conversion disorder, unspecified: Secondary | ICD-10-CM

## 2013-11-12 ENCOUNTER — Encounter: Payer: Self-pay | Admitting: Internal Medicine

## 2013-11-12 ENCOUNTER — Other Ambulatory Visit: Payer: Self-pay | Admitting: *Deleted

## 2013-11-12 ENCOUNTER — Ambulatory Visit (INDEPENDENT_AMBULATORY_CARE_PROVIDER_SITE_OTHER): Payer: Medicare Other | Admitting: Internal Medicine

## 2013-11-12 ENCOUNTER — Encounter: Payer: Medicare Other | Admitting: Internal Medicine

## 2013-11-12 VITALS — BP 118/72 | HR 69 | Temp 97.6°F | Resp 12 | Wt 273.0 lb

## 2013-11-12 DIAGNOSIS — E1159 Type 2 diabetes mellitus with other circulatory complications: Secondary | ICD-10-CM

## 2013-11-12 DIAGNOSIS — I798 Other disorders of arteries, arterioles and capillaries in diseases classified elsewhere: Secondary | ICD-10-CM | POA: Diagnosis not present

## 2013-11-12 DIAGNOSIS — I2510 Atherosclerotic heart disease of native coronary artery without angina pectoris: Secondary | ICD-10-CM

## 2013-11-12 MED ORDER — INSULIN GLARGINE 100 UNIT/ML SOLOSTAR PEN: PEN_INJECTOR | SUBCUTANEOUS | Status: DC

## 2013-11-12 MED ORDER — INSULIN ASPART 100 UNIT/ML ~~LOC~~ SOLN: SUBCUTANEOUS | Status: DC

## 2013-11-12 MED ORDER — GLUCOSE BLOOD VI STRP: ORAL_STRIP | Status: DC

## 2013-11-12 NOTE — Progress Notes (Signed)
Patient ID: [name] [name], male   DOB: 1955/10/09, 58 y.o.   MRN: [id number]  HPI: [name] [name] is a 58 y.o.-year-old male, returning for f/u for DM2, dx 2006, insulin-dependent since 2011, uncontrolled, with complications (CAD - CABG 2012, PVD - s/p Stents, Aortic and carotid stenosis, peripheral neuropathy). Last visit 2 mo ago.  He has URI and feels poorly.   He had increased pain in his left leg and also in bilateral toe tingling.  Last hemoglobin A1c was: Lab Results  Component Value Date   HGBA1C 9.4* 09/16/2013   HGBA1C 9.4* 05/18/2013   HGBA1C 8.3* 02/10/2013   Pt is on a regimen of: - Lantus 60 units bid >> 60 units in HS  - Novolog 14 >> 17 units tid We stopped Victoza.  Pt checks his sugars 3x a day and they are much higher now: - am: (120) 192-386 >> 150-200 >> 190-510 (200-300s) - 2h after b'fast: 150-300 >> cannot remember >> 179-449 - before lunch: n/c >> 269-325 - 2h after lunch: n/c >> 211-462 - before dinner: >200 >> 257-323 - 2h after dinner: n/c >> 268-290 - bedtime: 280->400 >> 210-220 >> 242, 319 No lows. Lowest sugar was 179; ? At what level he has hypoglycemia awareness. Highest sugar was 510.  Pt's meals are: - Breakfast: toast + eggs or cereals + milk - Lunch: soup + 1/2 sandwich - Dinner: meat + sweet potatoes or pasta + salad - Snacks: 3: sugar free foods, fruit He quit drinking sodas, but drinks tea - artificial sweetener.. Exercises by walking 2x a day.  - He has CKD, last BUN/creatinine:  Lab Results  Component Value Date   BUN 21 05/28/2013   CREATININE 1.3 05/28/2013  He is on Cozaar. - last set of lipids: Lab Results  Component Value Date   CHOL 122 05/18/2013   HDL 20.00* 05/18/2013   LDLCALC 50 04/25/2012   LDLDIRECT 66.9 05/18/2013   TRIG 224.0* 05/18/2013   CHOLHDL 6 05/18/2013  He  Is on Zocor. - last eye exam was in 04/2013 [facility] - Dr Violet Baldy ? DR. + cataracts. - + numbness and tingling in his feet - Dr Normal  Regal  I reviewed pt's medications, allergies, PMH, social hx, family hx and no changes required, except as mentioned above.  ROS: Constitutional: + weight  Loss and gain , no fatigue, + nocturia Eyes: + blurry vision, no xerophthalmia ENT: + sore throat, no nodules palpated in throat, + dysphagia/no odynophagia, no hoarseness, Cardiovascular: noCP/SOB/palpitations/+ leg swelling Respiratory: no cough/SOB Gastrointestinal: no N/V/D/+ C Musculoskeletal: + muscle aches/+ bone pain  Skin: + rashes,+ hair loss Neurological: no tremors/no numbness/tingling/dizziness + diff with erections, low libido  PE: BP 118/72  Pulse 69  Temp(Src) 97.6 F (36.4 C) (Oral)  Resp 12  Wt 273 lb (123.832 kg)  SpO2 95% Wt Readings from Last 3 Encounters:  11/12/13 273 lb (123.832 kg)  10/30/13 276 lb 6 oz (125.363 kg)  09/28/13 275 lb (124.739 kg)   Constitutional: obese, in NAD, ruddy complexion Eyes: PERRLA, EOMI, no exophthalmos ENT: dry mucous membranes, no thyromegaly, no cervical lymphadenopathy Cardiovascular: RRR, +2/6 SEM, no RG Respiratory: CTA B Gastrointestinal: abdomen soft, NT, ND, BS+ Musculoskeletal: no deformities, strength intact in all 4 Skin: moist, warm, + rash - rosacea on face and psoriasis rah on knees  Neurological: no tremor with outstretched hands, DTR normal in all 4  ASSESSMENT: 1. DM2, insulin-dependent, uncontrolled, with complications - CAD - s/p CABG  2012 - PVD - s/p stents 2012 - Aortic stenosis - carotid stenosis - CKD - peripheral neuropathy   PLAN:  1. Patient with long-standing, uncontrolled diabetes, on injectable regimen - now again with very high sugars. I tried to underline the importance of diet and exercise. He needs to start taking responsibility for this. For now, we will:  Patient Instructions  Please increase the insulin as follows: - Lantus to 80 units at bedtime - Novolog to 25 units 3x a day Please return in 1 month with your sugar  log.  Try to limit sweets and fat as much as you can. Please keep active. - discussed again about better diet choices >> started almond milk >> sugars better - continue to check 3 times a day, rotating checks - up to date with yearly eye exams - will get A1c at next visit - Return to clinic in 1 mo with sugar log

## 2013-11-12 NOTE — Patient Instructions (Addendum)
Please increase the insulin as follows: - Lantus to 80 units at bedtime - Novolog to 25 units 3x a day Please return in 1 month with your sugar log.   Try to limit sweets and fat as much as you can. Please keep active.

## 2013-11-18 ENCOUNTER — Ambulatory Visit (INDEPENDENT_AMBULATORY_CARE_PROVIDER_SITE_OTHER): Payer: Medicare Other | Admitting: Psychology

## 2013-11-18 DIAGNOSIS — F331 Major depressive disorder, recurrent, moderate: Secondary | ICD-10-CM | POA: Diagnosis not present

## 2013-11-18 DIAGNOSIS — F449 Dissociative and conversion disorder, unspecified: Secondary | ICD-10-CM

## 2013-11-23 ENCOUNTER — Ambulatory Visit (INDEPENDENT_AMBULATORY_CARE_PROVIDER_SITE_OTHER): Payer: Medicare Other | Admitting: Internal Medicine

## 2013-11-23 ENCOUNTER — Encounter: Payer: Self-pay | Admitting: Internal Medicine

## 2013-11-23 VITALS — BP 130/74 | HR 77 | Temp 98.1°F | Resp 16 | Ht 67.0 in | Wt 277.5 lb

## 2013-11-23 DIAGNOSIS — L03019 Cellulitis of unspecified finger: Secondary | ICD-10-CM

## 2013-11-23 DIAGNOSIS — Z0000 Encounter for general adult medical examination without abnormal findings: Secondary | ICD-10-CM | POA: Diagnosis not present

## 2013-11-23 DIAGNOSIS — Z125 Encounter for screening for malignant neoplasm of prostate: Secondary | ICD-10-CM | POA: Diagnosis not present

## 2013-11-23 DIAGNOSIS — E1159 Type 2 diabetes mellitus with other circulatory complications: Secondary | ICD-10-CM

## 2013-11-23 DIAGNOSIS — L609 Nail disorder, unspecified: Secondary | ICD-10-CM | POA: Insufficient documentation

## 2013-11-23 DIAGNOSIS — IMO0001 Reserved for inherently not codable concepts without codable children: Secondary | ICD-10-CM | POA: Insufficient documentation

## 2013-11-23 DIAGNOSIS — I798 Other disorders of arteries, arterioles and capillaries in diseases classified elsewhere: Secondary | ICD-10-CM | POA: Diagnosis not present

## 2013-11-23 DIAGNOSIS — L408 Other psoriasis: Secondary | ICD-10-CM

## 2013-11-23 DIAGNOSIS — L409 Psoriasis, unspecified: Secondary | ICD-10-CM

## 2013-11-23 LAB — COMPREHENSIVE METABOLIC PANEL
ALT: 19 U/L (ref 0–53)
AST: 17 U/L (ref 0–37)
Albumin: 3.5 g/dL (ref 3.5–5.2)
Alkaline Phosphatase: 118 U/L — ABNORMAL HIGH (ref 39–117)
BUN: 18 mg/dL (ref 6–23)
CO2: 29 mEq/L (ref 19–32)
Calcium: 9.2 mg/dL (ref 8.4–10.5)
Chloride: 101 mEq/L (ref 96–112)
Creatinine, Ser: 1.4 mg/dL (ref 0.4–1.5)
GFR: 56.3 mL/min — ABNORMAL LOW (ref >60.00)
Glucose, Bld: 91 mg/dL (ref 70–99)
Potassium: 4.8 mEq/L (ref 3.5–5.1)
Sodium: 138 mEq/L (ref 135–145)
Total Bilirubin: 0.4 mg/dL (ref 0.2–1.2)
Total Protein: 7 g/dL (ref 6.0–8.3)

## 2013-11-23 LAB — LIPID PANEL
Cholesterol: 111 mg/dL (ref 0–200)
HDL: 24.2 mg/dL — ABNORMAL LOW (ref >39.00)
LDL Cholesterol: 32 mg/dL (ref 0–99)
Total CHOL/HDL Ratio: 5
Triglycerides: 274 mg/dL — ABNORMAL HIGH (ref 0.0–149.0)
VLDL: 54.8 mg/dL — ABNORMAL HIGH (ref 0.0–40.0)

## 2013-11-23 LAB — MICROALBUMIN / CREATININE URINE RATIO
Creatinine,U: 89.6 mg/dL
Microalb Creat Ratio: 10.9 mg/g (ref 0.0–30.0)
Microalb, Ur: 9.8 mg/dL — ABNORMAL HIGH (ref 0.0–1.9)

## 2013-11-23 LAB — HEMOGLOBIN A1C: Hgb A1c MFr Bld: 9.5 % — ABNORMAL HIGH (ref 4.6–6.5)

## 2013-11-23 LAB — PSA, MEDICARE: PSA: 0.18 ng/ml (ref 0.10–4.00)

## 2013-11-23 LAB — HM DIABETES FOOT EXAM: HM Diabetic Foot Exam: NORMAL

## 2013-11-23 MED ORDER — DOXYCYCLINE HYCLATE 100 MG PO TABS: 100.0000 mg | ORAL_TABLET | Freq: Two times a day (BID) | ORAL | Status: DC

## 2013-11-23 MED ORDER — GENTAMICIN SULFATE 0.1 % EX OINT: 1.0000 "application " | TOPICAL_OINTMENT | Freq: Three times a day (TID) | CUTANEOUS | Status: DC

## 2013-11-23 MED ORDER — HYDROCODONE-ACETAMINOPHEN 5-325 MG PO TABS: 1.0000 | ORAL_TABLET | Freq: Four times a day (QID) | ORAL | Status: DC | PRN

## 2013-11-23 NOTE — Assessment & Plan Note (Signed)
General medical exam normal except as noted. Encouraged healthy diet and regular exercise. Immunizations are UTD. Labs today including CBC, CMP, lipids, A1c, PSA. Colonoscopy UTD.

## 2013-11-23 NOTE — Progress Notes (Signed)
Pre visit review using our clinic review tool, if applicable. No additional management support is needed unless otherwise documented below in the visit note. 

## 2013-11-23 NOTE — Assessment & Plan Note (Signed)
Symptoms of worsening psoriasis. Question if he might be a good candidate for new immune modulating agents. Will set up dermatology referral.

## 2013-11-23 NOTE — Progress Notes (Signed)
The patient is here for annual Medicare Wellness Examination and management of other chronic and acute problems.   The risk factors are reflected in the history.  The roster of all physicians providing medical care to patient - is listed in the Snapshot section of the chart.  Activities of daily living:   The patient is 100% independent in all ADLs: dressing, toileting, feeding as well as independent mobility. Patient lives with wife in home. Home has carpet, but planning to get hard wood. Has dog in home.  Home safety :  The patient has smoke detectors in the home.  They wear seatbelts in their car. There are no firearms at home.  There is no violence in the home. They feel safe where they live.  Infectious Risks: There is no risks for hepatitis, STDs or HIV.  There is no history of blood transfusion.  They have no travel history to infectious disease endemic areas of the world.  Additional Health Care Providers: The patient has not seen their dentist in the last six months. Wears dentures. They have seen their eye doctor in the last year. Opthalmologist - [facility], Dr. Dawna Part They deny hearing issues. They have deferred audiologic testing in the last year.   They do not  have excessive sun exposure. Discussed the need for sun protection: hats,long sleeves and use of sunscreen if there is significant sun exposure.  Dermatologist - none recently Vascular - Dr. Ronalee Belts Cardiology - Dr. Fletcher Anon Endocrine - Dr. Cruzita Lederer  Diet: the importance of a healthy diet is discussed. They do have a relatively healthy diet.  The benefits of regular aerobic exercise were discussed. Patient exercises by walking and water aerobics twice weekly.  Depression screen: there are no signs or vegative symptoms of depression- irritability, change in appetite, anhedonia, sadness/tearfullness.  Cognitive assessment: the patient manages all their financial and personal affairs and is actively engaged.  Manages own finances.  HCPOA - wife Living Will - in place  The following portions of the patient's history were reviewed and updated as appropriate: allergies, current medications, past family history, past medical history,  past surgical history, past social history and problem list.  Visual acuity was not assessed per patient preference as they have regular follow up with their ophthalmologist. Hearing and body mass index were assessed and reviewed.   During the course of the visit the patient was educated and counseled about appropriate screening and preventive services including : fall prevention , diabetes screening, nutrition counseling, colorectal cancer screening, and recommended immunizations.    He is concerned about several days of pain and redness in his left fourth finger around the nail bed. Yesterday it started draining pus. No fever or chills. Not taking anything for this.  Review of Systems  Constitutional: Negative for fever, chills, activity change, appetite change, fatigue and unexpected weight change.  Eyes: Negative for visual disturbance.  Respiratory: Negative for cough and shortness of breath.   Cardiovascular: Negative for chest pain, palpitations and leg swelling.  Gastrointestinal: Negative for abdominal pain and abdominal distention.  Genitourinary: Negative for dysuria, urgency and difficulty urinating.  Musculoskeletal: Positive for arthralgias, back pain and myalgias. Negative for gait problem.  Skin: Positive for color change and wound. Negative for rash.  Hematological: Negative for adenopathy.  Psychiatric/Behavioral: Negative for sleep disturbance and dysphoric mood. The patient is not nervous/anxious.        Objective:    BP 130/74  Pulse 77  Temp(Src) 98.1 F (36.7 C) (Oral)  Resp 16  Ht 5\' 7"  (1.702 m)  Wt 277 lb 8 oz (125.873 kg)  BMI 43.45 kg/m2  SpO2 96% Physical Exam  Constitutional: He is oriented to person, place, and time. He  appears well-developed and well-nourished. No distress.  HENT:  Head: Normocephalic and atraumatic.  Right Ear: External ear normal.  Left Ear: External ear normal.  Nose: Nose normal.  Mouth/Throat: Oropharynx is clear and moist. No oropharyngeal exudate.  Eyes: Conjunctivae and EOM are normal. Pupils are equal, round, and reactive to light. Right eye exhibits no discharge. Left eye exhibits no discharge. No scleral icterus.  Neck: Normal range of motion. Neck supple. No tracheal deviation present. No thyromegaly present.  Cardiovascular: Normal rate and regular rhythm.  Exam reveals no gallop and no friction rub.   Murmur heard. Pulmonary/Chest: Effort normal and breath sounds normal. No accessory muscle usage. Not tachypneic. No respiratory distress. He has no decreased breath sounds. He has no wheezes. He has no rhonchi. He has no rales. He exhibits no tenderness.  Abdominal: Soft. Bowel sounds are normal. He exhibits no distension and no mass. There is no tenderness. There is no rebound and no guarding.  Musculoskeletal: Normal range of motion. He exhibits no edema.  Lymphadenopathy:    He has no cervical adenopathy.  Neurological: He is alert and oriented to person, place, and time. No cranial nerve deficit. Coordination normal.  Skin: Skin is warm and dry. Rash noted. Rash is pustular (over arms legs, trunk, c/w psoriasis). He is not diaphoretic. There is erythema. No pallor.     Psychiatric: He has a normal mood and affect. His behavior is normal. Judgment and thought content normal.          Assessment & Plan:   Problem List Items Addressed This Visit   Medicare annual wellness visit, subsequent - Primary     General medical exam normal except as noted. Encouraged healthy diet and regular exercise. Immunizations are UTD. Labs today including CBC, CMP, lipids, A1c, PSA. Colonoscopy UTD.    Relevant Orders      Comprehensive metabolic panel (Completed)      Lipid panel  (Completed)      Microalbumin / creatinine urine ratio (Completed)      PSA, Medicare (Completed)   Paronychia of fourth finger, left     Will start topical gentamicin and oral doxycycline. Pt will call if any worsening redness, pain, fever.    Relevant Medications      gentamicin (GARAMYCIN) ointment 0.1%      DOXYCYCLINE HYCLATE 100 MG PO TABS   Psoriasis     Symptoms of worsening psoriasis. Question if he might be a good candidate for new immune modulating agents. Will set up dermatology referral.    Relevant Orders      Ambulatory referral to Dermatology   Type 2 diabetes mellitus with vascular disease   Relevant Orders      Comprehensive metabolic panel (Completed)      Hemoglobin A1c (Completed)       Return in about 6 months (around 05/26/2014) for Recheck.

## 2013-11-23 NOTE — Assessment & Plan Note (Signed)
Lab Results  Component Value Date   HGBA1C 9.5* 11/23/2013   Blood sugars continue to be markedly elevated. Encouraged compliance with diet, exercise and medication. Plan to follow up with endocrinology as scheduled.

## 2013-11-23 NOTE — Assessment & Plan Note (Signed)
Will start topical gentamicin and oral doxycycline. Pt will call if any worsening redness, pain, fever.

## 2013-11-24 ENCOUNTER — Other Ambulatory Visit: Payer: Self-pay | Admitting: *Deleted

## 2013-11-24 DIAGNOSIS — E1159 Type 2 diabetes mellitus with other circulatory complications: Secondary | ICD-10-CM

## 2013-11-24 MED ORDER — INSULIN ASPART 100 UNIT/ML ~~LOC~~ SOLN: SUBCUTANEOUS | Status: DC

## 2013-11-24 MED ORDER — INSULIN GLARGINE 100 UNIT/ML SOLOSTAR PEN: PEN_INJECTOR | SUBCUTANEOUS | Status: DC

## 2013-11-24 NOTE — Telephone Encounter (Signed)
Refill did not go through before due to it was on No Print. Switched to Normal so it would go through e-scribe.

## 2013-11-25 ENCOUNTER — Ambulatory Visit (INDEPENDENT_AMBULATORY_CARE_PROVIDER_SITE_OTHER): Payer: Medicare Other | Admitting: Psychology

## 2013-11-25 DIAGNOSIS — F449 Dissociative and conversion disorder, unspecified: Secondary | ICD-10-CM | POA: Diagnosis not present

## 2013-11-25 DIAGNOSIS — F331 Major depressive disorder, recurrent, moderate: Secondary | ICD-10-CM

## 2013-11-27 ENCOUNTER — Ambulatory Visit (INDEPENDENT_AMBULATORY_CARE_PROVIDER_SITE_OTHER): Payer: Medicare Other | Admitting: Podiatry

## 2013-11-27 ENCOUNTER — Encounter: Payer: Self-pay | Admitting: Podiatry

## 2013-11-27 VITALS — BP 116/80 | HR 68 | Resp 16

## 2013-11-27 DIAGNOSIS — B351 Tinea unguium: Secondary | ICD-10-CM

## 2013-11-27 DIAGNOSIS — M79609 Pain in unspecified limb: Secondary | ICD-10-CM

## 2013-11-27 NOTE — Progress Notes (Signed)
Subjective:     Patient ID: [name] [name], male   DOB: 09-10-1955, 58 y.o.   MRN: [id number]  HPI patient presents with thick nailbeds 1-5 both feet and very poor health with long-term diabetes   Review of Systems     Objective:   Physical Exam Neurovascular status intact with thick brittle nailbeds that are yellow and discolored 1-5 both feet    Assessment:     Mycotic nail infection with pain 1-5 both feet    Plan:     Debridement painful nailbeds 1-5 both feet with no bleeding noted

## 2013-11-30 NOTE — Telephone Encounter (Signed)
See phone note dated 09/28/2013

## 2013-12-02 ENCOUNTER — Ambulatory Visit (INDEPENDENT_AMBULATORY_CARE_PROVIDER_SITE_OTHER): Payer: Medicare Other | Admitting: Psychology

## 2013-12-02 DIAGNOSIS — F331 Major depressive disorder, recurrent, moderate: Secondary | ICD-10-CM

## 2013-12-02 DIAGNOSIS — F449 Dissociative and conversion disorder, unspecified: Secondary | ICD-10-CM | POA: Diagnosis not present

## 2013-12-07 ENCOUNTER — Ambulatory Visit (INDEPENDENT_AMBULATORY_CARE_PROVIDER_SITE_OTHER): Payer: Medicare Other | Admitting: Internal Medicine

## 2013-12-07 ENCOUNTER — Encounter: Payer: Self-pay | Admitting: Internal Medicine

## 2013-12-07 VITALS — BP 130/72 | HR 72 | Temp 97.6°F | Wt 278.5 lb

## 2013-12-07 DIAGNOSIS — M25569 Pain in unspecified knee: Secondary | ICD-10-CM | POA: Insufficient documentation

## 2013-12-07 DIAGNOSIS — I2510 Atherosclerotic heart disease of native coronary artery without angina pectoris: Secondary | ICD-10-CM | POA: Diagnosis not present

## 2013-12-07 DIAGNOSIS — F411 Generalized anxiety disorder: Secondary | ICD-10-CM | POA: Diagnosis not present

## 2013-12-07 DIAGNOSIS — M25562 Pain in left knee: Secondary | ICD-10-CM

## 2013-12-07 DIAGNOSIS — F419 Anxiety disorder, unspecified: Secondary | ICD-10-CM

## 2013-12-07 DIAGNOSIS — M25561 Pain in right knee: Secondary | ICD-10-CM | POA: Insufficient documentation

## 2013-12-07 MED ORDER — DIAZEPAM 2 MG PO TABS: ORAL_TABLET | ORAL | Status: DC

## 2013-12-07 MED ORDER — DIAZEPAM 5 MG PO TABS: 5.0000 mg | ORAL_TABLET | Freq: Every evening | ORAL | Status: DC | PRN

## 2013-12-07 NOTE — Progress Notes (Signed)
Subjective:    Patient ID: [name] [name], male    DOB: 10/28/1955, 58 y.o.   MRN: [id number]  HPI 58YO male presents for urgent visit.  Knee pain - left>right. Aching pain. Occasional swelling right knee. Had xrays with ortho which were normal. Pain made worse by increased activity. Feels like knee may give away on him. Using Jonell Brumbaugh or cane for support. Wearing supportive brace Taking ibuprofen for pain with some improvement, hydrocodone only on rare occasion. Pain sometimes disrupting sleep.  Anxiety - Symptoms well controlled on current medications. Would like to taper down on Diazepam.  Review of Systems  Constitutional: Negative for fever, chills, activity change, appetite change, fatigue and unexpected weight change.  Eyes: Negative for visual disturbance.  Respiratory: Negative for cough and shortness of breath.   Cardiovascular: Negative for chest pain, palpitations and leg swelling.  Gastrointestinal: Negative for abdominal pain and abdominal distention.  Genitourinary: Negative for dysuria, urgency and difficulty urinating.  Musculoskeletal: Positive for arthralgias, back pain and myalgias. Negative for gait problem.  Skin: Negative for color change and rash.  Hematological: Negative for adenopathy.  Psychiatric/Behavioral: Negative for sleep disturbance and dysphoric mood. The patient is nervous/anxious.        Objective:    BP 130/72  Pulse 72  Temp(Src) 97.6 F (36.4 C) (Oral)  Wt 278 lb 8 oz (126.327 kg)  SpO2 99% Physical Exam  Constitutional: He is oriented to person, place, and time. He appears well-developed and well-nourished. No distress.  HENT:  Head: Normocephalic and atraumatic.  Right Ear: External ear normal.  Left Ear: External ear normal.  Nose: Nose normal.  Mouth/Throat: Oropharynx is clear and moist. No oropharyngeal exudate.  Eyes: Conjunctivae and EOM are normal. Pupils are equal, round, and reactive to light. Right eye exhibits no discharge.  Left eye exhibits no discharge. No scleral icterus.  Neck: Normal range of motion. Neck supple. No tracheal deviation present. No thyromegaly present.  Cardiovascular: Normal rate and regular rhythm.  Exam reveals no gallop and no friction rub.   Murmur heard. Pulmonary/Chest: Effort normal and breath sounds normal. No accessory muscle usage. Not tachypneic. No respiratory distress. He has no decreased breath sounds. He has no wheezes. He has no rhonchi. He has no rales. He exhibits no tenderness.  Musculoskeletal: Normal range of motion. He exhibits no edema.       Right knee: He exhibits normal range of motion, no swelling and no deformity.       Left knee: Normal. He exhibits normal range of motion, no swelling and no deformity.  Lymphadenopathy:    He has no cervical adenopathy.  Neurological: He is alert and oriented to person, place, and time. No cranial nerve deficit. Coordination normal.  Skin: Skin is warm and dry. No rash noted. He is not diaphoretic. No erythema. No pallor.  Psychiatric: He has a normal mood and affect. His behavior is normal. Judgment and thought content normal.          Assessment & Plan:   Problem List Items Addressed This Visit   Anxiety - Primary     Symptoms well controlled with Sertraline, Bupropion and Diazepam. Will try to taper Diazepam to twice daily prn. Continue with counseling. Follow up 6 months here and prn.    Relevant Medications      diazepam (VALIUM) tablet      diazepam (VALIUM) tablet   Bilateral knee pain     Will request recent xrays from [facility]. Suspect osteoarthritis leading  to symptoms. However, exam normal today. Will set up ortho evaluation. Question if might be a candidate for Synvisc.    Relevant Orders      Ambulatory referral to Orthopedic Surgery       Return in about 6 months (around 06/09/2014), or if symptoms worsen or fail to improve, for Recheck of Diabetes.

## 2013-12-07 NOTE — Assessment & Plan Note (Signed)
Will request recent xrays from [place]. Suspect osteoarthritis leading to symptoms. However, exam normal today. Will set up ortho evaluation. Question if might be a candidate for Synvisc.

## 2013-12-07 NOTE — Assessment & Plan Note (Signed)
Symptoms well controlled with Sertraline, Bupropion and Diazepam. Will try to taper Diazepam to twice daily prn. Continue with counseling. Follow up 6 months here and prn.

## 2013-12-07 NOTE — Progress Notes (Signed)
Pre visit review using our clinic review tool, if applicable. No additional management support is needed unless otherwise documented below in the visit note. 

## 2013-12-07 NOTE — Patient Instructions (Signed)
We will set up orthopedic evaluation with Dr. Maureen Ralphs.  Try limiting your dose of Diazepam to twice daily.

## 2013-12-09 ENCOUNTER — Ambulatory Visit: Payer: Medicare Other | Admitting: Psychology

## 2013-12-16 ENCOUNTER — Other Ambulatory Visit: Payer: Medicare Other

## 2013-12-16 DIAGNOSIS — Z8619 Personal history of other infectious and parasitic diseases: Secondary | ICD-10-CM

## 2013-12-17 ENCOUNTER — Encounter: Payer: Self-pay | Admitting: Internal Medicine

## 2013-12-17 ENCOUNTER — Ambulatory Visit (INDEPENDENT_AMBULATORY_CARE_PROVIDER_SITE_OTHER): Payer: Medicare Other | Admitting: Internal Medicine

## 2013-12-17 VITALS — BP 112/64 | HR 79 | Temp 97.7°F | Resp 12 | Wt 278.8 lb

## 2013-12-17 DIAGNOSIS — I798 Other disorders of arteries, arterioles and capillaries in diseases classified elsewhere: Secondary | ICD-10-CM | POA: Diagnosis not present

## 2013-12-17 DIAGNOSIS — E1159 Type 2 diabetes mellitus with other circulatory complications: Secondary | ICD-10-CM | POA: Diagnosis not present

## 2013-12-17 DIAGNOSIS — I2510 Atherosclerotic heart disease of native coronary artery without angina pectoris: Secondary | ICD-10-CM | POA: Diagnosis not present

## 2013-12-17 MED ORDER — INSULIN ASPART 100 UNIT/ML ~~LOC~~ SOLN: SUBCUTANEOUS | Status: DC

## 2013-12-17 NOTE — Patient Instructions (Signed)
Please increase the insulin as follows: - continue Lantus to 80 units at bedtime - increase the Novolog to; 25 units with breakfast 25 units with lunch 30 units with dinner Please return in 1.5 months with your sugar log.  Try to limit sweets and fat as much as you can. Please try to keep active.

## 2013-12-17 NOTE — Progress Notes (Signed)
Patient ID: [name] [name], male   DOB: Aug 31, 1955, 58 y.o.   MRN: [id number]  HPI: [name] [name] is a 59 y.o.-year-old male, returning for f/u for DM2, dx 2006, insulin-dependent since 2011, uncontrolled, with complications (CAD - CABG 2012, PVD - s/p Stents, Aortic and carotid stenosis, peripheral neuropathy). Last visit 1 mo ago.  Last hemoglobin A1c was: Lab Results  Component Value Date   HGBA1C 9.5* 11/23/2013   HGBA1C 9.4* 09/16/2013   HGBA1C 9.4* 05/18/2013   Pt is on a regimen of: - Lantus 60 units bid >> 60 units in HS >> 80 units in HS - Novolog 14 >> 17 >> 25 units tid We stopped Victoza at a previous visit.  At last visit, I tried to underline the importance of diet and exercise and the fact that he needs to start taking responsibility about this. He started to improve his diet >> sugars better. Pt checks his sugars 3x a day and they are a little better: - am: [phone] 192-386 >> 150-200 >> 190-510 (200-300s) >> 132-357 (most <280) - 2h after b'fast: 150-300 >> cannot remember >> 179-449 >> 226-271 (348) - before lunch: n/c >> 269-325 >> 53 x1 - 2h after lunch: n/c >> 211-462 >> 121, 174 - before dinner: >200 >> 257-323 >> 86-231 (282) - 2h after dinner: n/c >> 268-290 >> 173, 223, 234 - bedtime: 280->400 >> 210-220 >> 242, 319 >> 265 No lows. Lowest sugar was 179; ? At what level he has hypoglycemia awareness. Highest sugar was 510.  Pt's meals are: - Breakfast: toast + eggs or cereals + milk - Lunch: soup + 1/2 sandwich - Dinner: meat + sweet potatoes or pasta + salad - Snacks: 3: sugar free foods, fruit He quit drinking sodas, but drinks tea - artificial sweetener. He also started to drink Almond milk. Exercises by walking 2x a day.  - He has CKD, last BUN/creatinine:  Lab Results  Component Value Date   BUN 18 11/23/2013   CREATININE 1.4 11/23/2013  He is on Cozaar. - last set of lipids: Lab Results  Component Value Date   CHOL 111 11/23/2013   HDL 24.20* 11/23/2013   LDLCALC 32 11/23/2013   LDLDIRECT 66.9 05/18/2013   TRIG 274.0* 11/23/2013   CHOLHDL 5 11/23/2013  He  Is on Zocor. - last eye exam was in 04/2013 [facility] - Dr Violet Baldy ? DR. + cataracts. - + numbness and tingling in his feet - Dr Normal Regal   I reviewed pt's medications, allergies, PMH, social hx, family hx and no changes required, except as mentioned above.  ROS: Constitutional: + weight gain , no fatigue, + nocturia, + feeling cold, + noctiria Eyes: + blurry vision, no xerophthalmia ENT: + sore throat, no nodules palpated in throat, + dysphagia/no odynophagia, no hoarseness, + decreased hearing Cardiovascular: noCP/SOB/palpitations/+ leg swelling Respiratory: no cough/SOB Gastrointestinal: no N/V/D/+ C Musculoskeletal: + muscle aches/+ bone pain  Skin: + rashes,+ hair loss Neurological: no tremors/no numbness/tingling/dizziness + diff with erections, low libido  PE: BP 112/64  Pulse 79  Temp(Src) 97.7 F (36.5 C) (Oral)  Resp 12  Wt 278 lb 12.8 oz (126.463 kg)  SpO2 95% Wt Readings from Last 3 Encounters:  12/17/13 278 lb 12.8 oz (126.463 kg)  12/07/13 278 lb 8 oz (126.327 kg)  11/23/13 277 lb 8 oz (125.873 kg)   Constitutional: obese, in NAD, ruddy complexion Eyes: PERRLA, EOMI, no exophthalmos ENT: dry mucous membranes, no thyromegaly, no cervical lymphadenopathy Cardiovascular:  RRR, +2/6 SEM, no RG Respiratory: CTA B Gastrointestinal: abdomen soft, NT, ND, BS+ Musculoskeletal: no deformities, strength intact in all 4 Skin: moist, warm, + rash - rosacea on face and psoriasis rash on knees  Neurological: no tremor with outstretched hands, DTR normal in all 4  ASSESSMENT: 1. DM2, insulin-dependent, uncontrolled, with complications - CAD - s/p CABG 2012 - PVD - s/p stents 2012 - Aortic stenosis - carotid stenosis - CKD - peripheral neuropathy   PLAN:  1. Patient with long-standing, uncontrolled diabetes, on injectable regimen - his sugars are a little  improved after improving diet >> encouraged him to continue and also start exercising. For now, we will increase the NovoLog with dinner, so that sugars in am decrease: Patient Instructions  Please increase the insulin as follows: - continue Lantus to 80 units at bedtime - increase the Novolog to; 25 units with breakfast 25 units with lunch 30 units with dinner Please return in 1.5 months with your sugar log.  Try to limit sweets and fat as much as you can. Please try to keep active. - continue to check 3 times a day, rotating checks - up to date with yearly eye exams - Return to clinic in 1.5 mo with sugar log

## 2013-12-18 LAB — HELICOBACTER PYLORI  SPECIAL ANTIGEN: H. PYLORI Antigen: NEGATIVE

## 2013-12-23 ENCOUNTER — Ambulatory Visit (INDEPENDENT_AMBULATORY_CARE_PROVIDER_SITE_OTHER): Payer: Medicare Other | Admitting: Psychology

## 2013-12-23 DIAGNOSIS — F331 Major depressive disorder, recurrent, moderate: Secondary | ICD-10-CM | POA: Diagnosis not present

## 2013-12-23 DIAGNOSIS — F449 Dissociative and conversion disorder, unspecified: Secondary | ICD-10-CM

## 2013-12-24 DIAGNOSIS — M2390 Unspecified internal derangement of unspecified knee: Secondary | ICD-10-CM | POA: Diagnosis not present

## 2013-12-24 DIAGNOSIS — M25569 Pain in unspecified knee: Secondary | ICD-10-CM | POA: Diagnosis not present

## 2013-12-24 DIAGNOSIS — M25469 Effusion, unspecified knee: Secondary | ICD-10-CM | POA: Diagnosis not present

## 2013-12-24 DIAGNOSIS — M2240 Chondromalacia patellae, unspecified knee: Secondary | ICD-10-CM | POA: Diagnosis not present

## 2013-12-28 DIAGNOSIS — L259 Unspecified contact dermatitis, unspecified cause: Secondary | ICD-10-CM | POA: Diagnosis not present

## 2013-12-28 DIAGNOSIS — L408 Other psoriasis: Secondary | ICD-10-CM | POA: Diagnosis not present

## 2013-12-28 DIAGNOSIS — R21 Rash and other nonspecific skin eruption: Secondary | ICD-10-CM | POA: Diagnosis not present

## 2013-12-28 DIAGNOSIS — L719 Rosacea, unspecified: Secondary | ICD-10-CM | POA: Diagnosis not present

## 2013-12-30 ENCOUNTER — Ambulatory Visit (INDEPENDENT_AMBULATORY_CARE_PROVIDER_SITE_OTHER): Payer: Medicare Other | Admitting: Psychology

## 2013-12-30 DIAGNOSIS — F331 Major depressive disorder, recurrent, moderate: Secondary | ICD-10-CM | POA: Diagnosis not present

## 2013-12-30 DIAGNOSIS — F4310 Post-traumatic stress disorder, unspecified: Secondary | ICD-10-CM

## 2014-01-06 ENCOUNTER — Ambulatory Visit (INDEPENDENT_AMBULATORY_CARE_PROVIDER_SITE_OTHER): Payer: Medicare Other | Admitting: Psychology

## 2014-01-06 DIAGNOSIS — F449 Dissociative and conversion disorder, unspecified: Secondary | ICD-10-CM

## 2014-01-06 DIAGNOSIS — F331 Major depressive disorder, recurrent, moderate: Secondary | ICD-10-CM

## 2014-01-10 ENCOUNTER — Other Ambulatory Visit: Payer: Self-pay | Admitting: Internal Medicine

## 2014-01-11 DIAGNOSIS — E785 Hyperlipidemia, unspecified: Secondary | ICD-10-CM | POA: Insufficient documentation

## 2014-01-11 DIAGNOSIS — B351 Tinea unguium: Secondary | ICD-10-CM | POA: Diagnosis not present

## 2014-01-11 DIAGNOSIS — I359 Nonrheumatic aortic valve disorder, unspecified: Secondary | ICD-10-CM | POA: Insufficient documentation

## 2014-01-11 DIAGNOSIS — I10 Essential (primary) hypertension: Secondary | ICD-10-CM | POA: Insufficient documentation

## 2014-01-11 DIAGNOSIS — E1159 Type 2 diabetes mellitus with other circulatory complications: Secondary | ICD-10-CM | POA: Diagnosis not present

## 2014-01-11 DIAGNOSIS — I798 Other disorders of arteries, arterioles and capillaries in diseases classified elsewhere: Secondary | ICD-10-CM | POA: Diagnosis not present

## 2014-01-12 DIAGNOSIS — M2240 Chondromalacia patellae, unspecified knee: Secondary | ICD-10-CM | POA: Diagnosis not present

## 2014-01-12 DIAGNOSIS — M25569 Pain in unspecified knee: Secondary | ICD-10-CM | POA: Diagnosis not present

## 2014-01-12 DIAGNOSIS — M2390 Unspecified internal derangement of unspecified knee: Secondary | ICD-10-CM | POA: Diagnosis not present

## 2014-01-12 DIAGNOSIS — M25469 Effusion, unspecified knee: Secondary | ICD-10-CM | POA: Diagnosis not present

## 2014-01-13 ENCOUNTER — Ambulatory Visit (INDEPENDENT_AMBULATORY_CARE_PROVIDER_SITE_OTHER): Payer: Medicare Other | Admitting: Psychology

## 2014-01-13 ENCOUNTER — Other Ambulatory Visit: Payer: Self-pay

## 2014-01-13 DIAGNOSIS — F331 Major depressive disorder, recurrent, moderate: Secondary | ICD-10-CM | POA: Diagnosis not present

## 2014-01-13 DIAGNOSIS — F449 Dissociative and conversion disorder, unspecified: Secondary | ICD-10-CM

## 2014-01-13 MED ORDER — CLOPIDOGREL BISULFATE 75 MG PO TABS: 75.0000 mg | ORAL_TABLET | Freq: Every day | ORAL | Status: DC

## 2014-01-13 NOTE — Telephone Encounter (Signed)
Refill sent for plavix  

## 2014-01-28 ENCOUNTER — Encounter: Payer: Self-pay | Admitting: Internal Medicine

## 2014-01-28 ENCOUNTER — Ambulatory Visit (INDEPENDENT_AMBULATORY_CARE_PROVIDER_SITE_OTHER): Payer: Medicare Other | Admitting: Internal Medicine

## 2014-01-28 VITALS — BP 124/80 | HR 100 | Temp 98.0°F | Resp 12 | Wt 274.0 lb

## 2014-01-28 DIAGNOSIS — E1159 Type 2 diabetes mellitus with other circulatory complications: Secondary | ICD-10-CM | POA: Diagnosis not present

## 2014-01-28 DIAGNOSIS — I798 Other disorders of arteries, arterioles and capillaries in diseases classified elsewhere: Secondary | ICD-10-CM

## 2014-01-28 DIAGNOSIS — I2510 Atherosclerotic heart disease of native coronary artery without angina pectoris: Secondary | ICD-10-CM

## 2014-01-28 MED ORDER — INSULIN REGULAR HUMAN 100 UNIT/ML IJ SOLN: 15.0000 [IU] | Freq: Three times a day (TID) | INTRAMUSCULAR | Status: DC

## 2014-01-28 MED ORDER — "INSULIN SYRINGE-NEEDLE U-100 31G X 5/16"" 1 ML MISC": Status: DC

## 2014-01-28 MED ORDER — INSULIN NPH (HUMAN) (ISOPHANE) 100 UNIT/ML ~~LOC~~ SUSP: SUBCUTANEOUS | Status: DC

## 2014-01-28 NOTE — Patient Instructions (Signed)
Please stop the Lantus and NovoLog insulin and start the following:  Insulin Before breakfast Before lunch Before dinner  Regular (short acting) 20 20 15   NPH (long acting) 35  35  Please inject the insulin 30 min before meals.  If you plan to work outside in the morning, after breakfast, please take less insulin by 5 units (both NPH and Regular).  Please return in 1 month with your sugar log.

## 2014-01-28 NOTE — Progress Notes (Signed)
Patient ID: [name] [name], male   DOB: 04/30/1956, 58 y.o.   MRN: [id number]  HPI: [name] [name] is a 58 y.o.-year-old male, returning for f/u for DM2, dx 2006, insulin-dependent since 2011, uncontrolled, with complications (CAD - CABG 2012, PVD - s/p Stents, Aortic and carotid stenosis, peripheral neuropathy). Last visit 1.5 mo ago.  Since last visit, he had 2 cortisone injections in knees: 06/04 and 06/23.   She is here with his wife and mention that he is confused most of the time - they ? If this can be caused by the insulin he is taking.   Last hemoglobin A1c was: Lab Results  Component Value Date   HGBA1C 9.5* 11/23/2013   HGBA1C 9.4* 09/16/2013   HGBA1C 9.4* 05/18/2013   Pt is on a regimen of: - Lantus 60 units bid >> 60 units in HS >> 80 units in HS - Novolog 14 >> 17 >> 25-25-30 We stopped Victoza at a previous visit. He cannot afford his insulins (doughnut hole) - 400$.   Pt checks his sugars 3x a day and they still fluctuating: - am: (120) 192-386 >> 150-200 >> 190-510 (200-300s) >> 132-357 (most <280) >> 125-378 (457) - 2h after b'fast: 150-300 >> cannot remember >> 179-449 >> 226-271 (348) >> 198, 239 - before lunch: n/c >> 269-325 >> 53 x1 >> 70, 102 - 2h after lunch: n/c >> 211-462 >> 121, 174 >> 84-300 (332 x1) - before dinner: >200 >> 257-323 >> 86-231 (282) >> 53 x1, 122-352 - 2h after dinner: n/c >> 268-290 >> 173, 223, 234 >> n/c - bedtime: 280->400 >> 210-220 >> 242, 319 >> 265 >> n/c No lows. Lowest sugar was 53; ? At what level he has hypoglycemia awareness. Highest sugar was 457  Pt's meals are: - Breakfast: toast + eggs or cereals + milk - Lunch: soup + 1/2 sandwich - Dinner: meat + sweet potatoes or pasta + salad - Snacks: 3: sugar free foods, fruit He quit drinking sodas, but drinks tea - artificial sweetener. He also started to drink Almond milk. Exercises by walking 2x a day.  - He has CKD, last BUN/creatinine:  Lab Results  Component Value Date    BUN 18 11/23/2013   CREATININE 1.4 11/23/2013  He is on Cozaar. - last set of lipids: Lab Results  Component Value Date   CHOL 111 11/23/2013   HDL 24.20* 11/23/2013   LDLCALC 32 11/23/2013   LDLDIRECT 66.9 05/18/2013   TRIG 274.0* 11/23/2013   CHOLHDL 5 11/23/2013  He  Is on Zocor. - last eye exam was in 04/2013 [facility] - Dr Violet Baldy ? DR. + cataracts. - + numbness and tingling in his feet - Dr Normal Regal, but will go to [facility]) - Dr Elvina Mattes. He had a foot exam 01/11/2014.   I reviewed pt's medications, allergies, PMH, social hx, family hx and no changes required, except as mentioned above.  ROS: Constitutional: + weight gain and loss , no fatigue, + nocturia, + feeling hot and cold, no nocturia Eyes: + blurry vision, no xerophthalmia ENT: + sore throat, no nodules palpated in throat, + dysphagia/no odynophagia,+  hoarseness, + decreased hearing Cardiovascular: no CP/+ SOB/no palpitations/+ leg swelling Respiratory: no cough/+ SOB Gastrointestinal: no N/V/D/+ C Musculoskeletal: no muscle aches/+ bone pain  Skin: + rashes,+ hair loss Neurological: no tremors/no numbness/tingling/dizziness + diff with erections, low libido  PE: BP 124/80  Pulse 100  Temp(Src) 98 F (36.7 C) (Oral)  Resp 12  Wt 274 lb (124.286 kg)  SpO2 95% Wt Readings from Last 3 Encounters:  01/28/14 274 lb (124.286 kg)  12/17/13 278 lb 12.8 oz (126.463 kg)  12/07/13 278 lb 8 oz (126.327 kg)   Constitutional: obese, in NAD, ruddy complexion Eyes: PERRLA, EOMI, no exophthalmos ENT: dry mucous membranes, no thyromegaly, no cervical lymphadenopathy Cardiovascular: RRR, +2/6 SEM, no RG Respiratory: CTA B Gastrointestinal: abdomen soft, NT, ND, BS+ Musculoskeletal: no deformities, strength intact in all 4 Skin: moist, warm, + rash - rosacea on face and psoriasis rash on knees  Neurological: no tremor with outstretched hands, DTR normal in all 4  ASSESSMENT: 1. DM2, insulin-dependent,  uncontrolled, with complications - CAD - s/p CABG 2012 - PVD - s/p stents 2012 - Aortic stenosis - carotid stenosis - CKD - peripheral neuropathy   PLAN:  1. Patient with long-standing, uncontrolled diabetes, on injectable regimen - his sugars are a little improved after improving diet >> encouraged him to continue and also start exercising.  - He had few low CBGs after exertion, advised to take less insulin when he plans to work in the yard - will also need to change the regimen to a more affordable one: Patient Instructions  Please stop the Lantus and NovoLog insulin and start the following: Insulin Before breakfast Before lunch Before dinner  Regular (short acting) 20 20 15   NPH (long acting) 35  35  Please inject the insulin 30 min before meals. If you plan to work outside in the morning, after breakfast, please take less insulin by 5 units (both NPH and Regular). Please return in 1 month with your sugar log.  Given brochure about mixing the 2 insulins - continue to check 3 times a day, rotating checks - up to date with yearly eye exams - Return to clinic in 1.5 mo with sugar log

## 2014-01-30 LAB — HM DIABETES FOOT EXAM

## 2014-02-02 ENCOUNTER — Other Ambulatory Visit: Payer: Self-pay | Admitting: Internal Medicine

## 2014-02-02 DIAGNOSIS — N509 Disorder of male genital organs, unspecified: Secondary | ICD-10-CM | POA: Diagnosis not present

## 2014-02-02 DIAGNOSIS — N400 Enlarged prostate without lower urinary tract symptoms: Secondary | ICD-10-CM | POA: Diagnosis not present

## 2014-02-02 NOTE — Telephone Encounter (Signed)
Last refill 3.16.15, last OV 5.18.15.  Please advise refill.

## 2014-02-03 ENCOUNTER — Ambulatory Visit (INDEPENDENT_AMBULATORY_CARE_PROVIDER_SITE_OTHER): Payer: Medicare Other | Admitting: Psychology

## 2014-02-03 DIAGNOSIS — F449 Dissociative and conversion disorder, unspecified: Secondary | ICD-10-CM | POA: Diagnosis not present

## 2014-02-03 DIAGNOSIS — F331 Major depressive disorder, recurrent, moderate: Secondary | ICD-10-CM | POA: Diagnosis not present

## 2014-02-10 ENCOUNTER — Ambulatory Visit (INDEPENDENT_AMBULATORY_CARE_PROVIDER_SITE_OTHER): Payer: Medicare Other | Admitting: Psychology

## 2014-02-10 DIAGNOSIS — F449 Dissociative and conversion disorder, unspecified: Secondary | ICD-10-CM | POA: Diagnosis not present

## 2014-02-10 DIAGNOSIS — F331 Major depressive disorder, recurrent, moderate: Secondary | ICD-10-CM | POA: Diagnosis not present

## 2014-02-16 DIAGNOSIS — M2240 Chondromalacia patellae, unspecified knee: Secondary | ICD-10-CM | POA: Diagnosis not present

## 2014-02-16 DIAGNOSIS — M25569 Pain in unspecified knee: Secondary | ICD-10-CM | POA: Diagnosis not present

## 2014-02-17 ENCOUNTER — Ambulatory Visit: Payer: Medicare Other | Admitting: Psychology

## 2014-02-17 ENCOUNTER — Ambulatory Visit (INDEPENDENT_AMBULATORY_CARE_PROVIDER_SITE_OTHER): Payer: Medicare Other | Admitting: Family Medicine

## 2014-02-17 ENCOUNTER — Encounter: Payer: Self-pay | Admitting: Family Medicine

## 2014-02-17 VITALS — BP 136/82 | HR 109 | Temp 98.7°F | Ht 67.0 in | Wt 278.2 lb

## 2014-02-17 DIAGNOSIS — I2510 Atherosclerotic heart disease of native coronary artery without angina pectoris: Secondary | ICD-10-CM | POA: Diagnosis not present

## 2014-02-17 DIAGNOSIS — R1013 Epigastric pain: Secondary | ICD-10-CM | POA: Diagnosis not present

## 2014-02-17 DIAGNOSIS — I798 Other disorders of arteries, arterioles and capillaries in diseases classified elsewhere: Secondary | ICD-10-CM | POA: Diagnosis not present

## 2014-02-17 DIAGNOSIS — I2584 Coronary atherosclerosis due to calcified coronary lesion: Secondary | ICD-10-CM | POA: Diagnosis not present

## 2014-02-17 DIAGNOSIS — E1159 Type 2 diabetes mellitus with other circulatory complications: Secondary | ICD-10-CM | POA: Diagnosis not present

## 2014-02-17 LAB — COMPREHENSIVE METABOLIC PANEL
ALT: 21 U/L (ref 0–53)
AST: 16 U/L (ref 0–37)
Albumin: 3.5 g/dL (ref 3.5–5.2)
Alkaline Phosphatase: 132 U/L — ABNORMAL HIGH (ref 39–117)
BUN: 19 mg/dL (ref 6–23)
CO2: 30 mEq/L (ref 19–32)
Calcium: 9.2 mg/dL (ref 8.4–10.5)
Chloride: 100 mEq/L (ref 96–112)
Creatinine, Ser: 1.6 mg/dL — ABNORMAL HIGH (ref 0.4–1.5)
GFR: 48.43 mL/min — ABNORMAL LOW (ref >60.00)
Glucose, Bld: 217 mg/dL — ABNORMAL HIGH (ref 70–99)
Potassium: 4.1 mEq/L (ref 3.5–5.1)
Sodium: 138 mEq/L (ref 135–145)
Total Bilirubin: 0.5 mg/dL (ref 0.2–1.2)
Total Protein: 8.1 g/dL (ref 6.0–8.3)

## 2014-02-17 LAB — POCT URINALYSIS DIPSTICK
Bilirubin, UA: NEGATIVE
Blood, UA: NEGATIVE
Glucose, UA: NEGATIVE
Ketones, UA: NEGATIVE
Leukocytes, UA: NEGATIVE
Nitrite, UA: NEGATIVE
Protein, UA: 30
Spec Grav, UA: 1.025
Urobilinogen, UA: 0.2
pH, UA: 5.5

## 2014-02-17 LAB — CBC WITH DIFFERENTIAL/PLATELET
Basophils Absolute: 0 10*3/uL (ref 0.0–0.1)
Basophils Relative: 0.2 % (ref 0.0–3.0)
Eosinophils Absolute: 0.2 10*3/uL (ref 0.0–0.7)
Eosinophils Relative: 1.2 % (ref 0.0–5.0)
HCT: 40.2 % (ref 39.0–52.0)
Hemoglobin: 13.3 g/dL (ref 13.0–17.0)
Lymphocytes Relative: 13.8 % (ref 12.0–46.0)
Lymphs Abs: 2 10*3/uL (ref 0.7–4.0)
MCHC: 33.2 g/dL (ref 30.0–36.0)
MCV: 89.8 fl (ref 78.0–100.0)
Monocytes Absolute: 0.9 10*3/uL (ref 0.1–1.0)
Monocytes Relative: 6 % (ref 3.0–12.0)
Neutro Abs: 11.3 10*3/uL — ABNORMAL HIGH (ref 1.4–7.7)
Neutrophils Relative %: 78.8 % — ABNORMAL HIGH (ref 43.0–77.0)
Platelets: 289 10*3/uL (ref 150.0–400.0)
RBC: 4.48 Mil/uL (ref 4.22–5.81)
RDW: 15.9 % — ABNORMAL HIGH (ref 11.5–15.5)
WBC: 14.3 10*3/uL — ABNORMAL HIGH (ref 4.0–10.5)

## 2014-02-17 LAB — LIPASE: Lipase: 8 U/L — ABNORMAL LOW (ref 11.0–59.0)

## 2014-02-17 LAB — AMYLASE: Amylase: 31 U/L (ref 27–131)

## 2014-02-17 LAB — GLUCOSE, POCT (MANUAL RESULT ENTRY): POC Glucose: 216 mg/dl — AB (ref 70–99)

## 2014-02-17 NOTE — Patient Instructions (Signed)
Lab today for abdominal pain  Leave urine specimen as well  Watch blood glucose closely - let Dr Renne Crigler know if it stays up  Please let us know if symptoms worsen

## 2014-02-17 NOTE — Progress Notes (Signed)
Pre visit review using our clinic review tool, if applicable. No additional management support is needed unless otherwise documented below in the visit note. 

## 2014-02-17 NOTE — Progress Notes (Signed)
Subjective:    Patient ID: [name] [name], male    DOB: 1955-07-25, 58 y.o.   MRN: [id number]  HPI Here for elevated blood sugar and dizziness   Woke up this am - felt a little draggy this am  His neuropathy was worse than usual (pain in L leg)   Went to [employer] this am - got sweaty after eating  , had eaten a sausage biscut and drank a little cold ice tea unsweetened   Glucose was high this am before eating 240s (high for him)  He takes regular insulin 15-20 tid with meals  Also 35 units of NPH am and before dinner  Took both of those today   Does not have a fever  He thinks that he feels feverish  Also wonders if he has an ear infections - gets a lot of ear pain  occ there is blood in his L ear canal- since last week  Feels dizzy   No cardiac symptoms - no cp / no more sob than usual  Wt is up also since last week -pt states he does not have CHF however  Legs are no more swollen than usual    He was here for a visit with Dr Rexene Edison and felt bad so came in for medical eval instead   Glucose is 211 right now   EKG - sinus tach with no acute changes   Has abdominal pain - mostly in upper abdomen - since eating  No n/v or stool change   Patient Active Problem List   Diagnosis Date Noted  . Abdominal pain, epigastric 02/17/2014  . Bilateral knee pain 12/07/2013  . Medicare annual wellness visit, subsequent 11/23/2013  . Paronychia of fourth finger, left 11/23/2013  . History of Helicobacter pylori infection 10/30/2013  . H. pylori infection 08/06/2013  . Anxiety 08/06/2013  . Belching 06/30/2013  . Left shoulder pain 05/18/2013  . Severe obesity (BMI >= 40) 02/12/2013  . Mild aortic stenosis   . Lumbar back pain 12/27/2011  . Depression 05/28/2011  . Aftercare following surgery of the circulatory system, [place] 04/17/2011  . Occlusion and stenosis of carotid artery without mention of cerebral infarction 04/17/2011  . Type 2 diabetes mellitus with vascular disease  03/05/2011  . Sleep apnea 03/05/2011  . Psoriasis 03/05/2011  . Neuropathy 03/05/2011  . Coronary artery disease   . Hyperlipidemia   . Hypertension    Past Medical History  Diagnosis Date  . Peripheral vascular disease   . Diabetes mellitus   . Mild aortic stenosis   . Tobacco use disorder     recently quit  . Depression   . Psoriasis   . Neuropathy   . Obesity   . Post splenectomy syndrome   . SOB (shortness of breath)   . Carotid artery occlusion   . Coronary artery disease   . Hyperlipidemia   . Hypertension   . Bell's palsy 2007  . Sleep apnea   . Angina at rest     chronic  . Abnormal colonoscopy 06/2011    Dr. Minna Merritts, polyp  . Cataract     Dr. Dawna Part  . Anxiety   . Arthritis   . DVT (deep venous thrombosis)     in leg  . Stroke   . Allergy   . Heart murmur   . Tubular adenoma 08/2013    Dr. Hilarie Fredrickson   Past Surgical History  Procedure Laterality Date  . Splenectomy    .  Heart stents  Jan 2011    leg stents 06/2009 and 03/2010  . Coronary artery bypass graft  09/06/2010    At [facility]: LIMA to LAD, left radial to RCA, sequential SVG to OM3 and 4  . Cardiac catheterization  08/2010    LAD: 80% ISR, RCA: 80% ostial, OM 80-90%  . Coronary angioplasty      LAD: before CABG  . Carotid endarterectomy  08/2010    left/ Dr. Kellie Simmering  . Pta of illiac and sfa  multiple    Dr. Ronalee Belts, s/p revision 11/2012  . Colonoscopy     History  Substance Use Topics  . Smoking status: Current Some Day Smoker -- 0.30 packs/day for 40 years    Types: Cigarettes  . Smokeless tobacco: Never Used     Comment: smokes 2-3 cigarettes every 3 weeks  . Alcohol Use: No   Family History  Problem Relation Age of Onset  . Lung cancer Father 19  . Breast cancer Sister 72  . Diabetes Paternal Grandfather   . Colon polyps Paternal Grandfather 37  . Dementia Mother   . Alzheimer's disease Mother    Allergies  Allergen Reactions  . Contrast Media [Iodinated Diagnostic Agents]     Got  very hot and red  . Glipizide Other (See Comments)    ANTIDIABETICS. Burning  . Penicillins Hives and Swelling   Current Outpatient Prescriptions on File Prior to Visit  Medication Sig Dispense Refill  . amitriptyline (ELAVIL) 50 MG tablet Take 50 mg by mouth at bedtime.      [name] aspirin 81 MG tablet Take 81 mg by mouth daily.      [name] BAYER MICROLET LANCETS lancets use as directed three times a day  100 each  12  . buPROPion (WELLBUTRIN XL) 150 MG 24 hr tablet take 1 tablet by mouth once daily  30 tablet  6  . cholecalciferol (VITAMIN D) 1000 UNITS tablet Take 1,000 Units by mouth daily.        . Cinnamon 500 MG capsule Take 1,000 mg by mouth daily.      . clopidogrel (PLAVIX) 75 MG tablet Take 1 tablet (75 mg total) by mouth daily.  90 tablet  4  . cyclobenzaprine (FLEXERIL) 10 MG tablet Take 10 mg by mouth 2 (two) times daily as needed.       . diazepam (VALIUM) 2 MG tablet take 1 tablet by mouth three times a day  90 tablet  3  . diazepam (VALIUM) 5 MG tablet Take 1 tablet (5 mg total) by mouth at bedtime as needed for anxiety.  30 tablet  4  . doxycycline (VIBRA-TABS) 100 MG tablet Take 1 tablet (100 mg total) by mouth 2 (two) times daily.  20 tablet  0  . gentamicin ointment (GARAMYCIN) 0.1 % Apply 1 application topically 3 (three) times daily.  15 g  0  . Glucosamine-Chondroit-Vit C-Mn (GLUCOSAMINE CHONDR 500 COMPLEX) CAPS Take 1,000 each by mouth daily.      [name] glucose blood (BAYER CONTOUR TEST) test strip Use to test blood sugar three times daily as instructed.  Dx code: 250.70  100 each  5  . HYDROcodone-acetaminophen (NORCO/VICODIN) 5-325 MG per tablet Take 1-2 tablets by mouth every 6 (six) hours as needed for moderate pain or severe pain.  90 tablet  0  . insulin NPH Human (HUMULIN N,NOVOLIN N) 100 UNIT/ML injection Inject under skin 35 units 2x a day: in am and before dinner  30 mL  11  . Insulin Pen Needle (NOVOFINE) 32G X 6 MM MISC Use three times daily  100 each  5  . insulin  regular (NOVOLIN R,HUMULIN R) 100 units/mL injection Inject 0.15-0.2 mLs (15-20 Units total) into the skin 3 (three) times daily before meals.  20 mL  11  . Insulin Syringe-Needle U-100 (RELION INSULIN SYRINGE 1ML/31G) 31G X 5/16" 1 ML MISC Use 3x a day  200 each  11  . Lactulose 20 GM/30ML SOLN Take 30 mLs by mouth every 2 (two) hours as needed.      Elmore Guise Device MISC 1 application by Does not apply route as needed.  100 each  11  . losartan (COZAAR) 25 MG tablet Take 1 tablet (25 mg total) by mouth daily.  90 tablet  3  . magnesium gluconate (MAGONATE) 500 MG tablet Take 1,000 mg by mouth daily.      . metoprolol succinate (TOPROL-XL) 50 MG 24 hr tablet take 1 tablet by mouth twice a day  60 tablet  5  . Multiple Vitamin (MULTIVITAMIN) capsule Take 1 capsule by mouth daily.        . nitazoxanide (ALINIA) 500 MG tablet Take 1 tablet (500 mg total) by mouth 2 (two) times daily with a meal.  20 tablet  0  . nitroGLYCERIN (NITROSTAT) 0.4 MG SL tablet Place 1 tablet (0.4 mg total) under the tongue every 5 (five) minutes as needed.  25 tablet  1  . omeprazole (PRILOSEC) 20 MG capsule Take 1 capsule (20 mg total) by mouth 2 (two) times daily.  20 capsule  0  . polyethylene glycol (MIRALAX) packet Take 17 g by mouth daily.  14 each  3  . sertraline (ZOLOFT) 50 MG tablet take 1 tablet by mouth once daily  30 tablet  4  . simvastatin (ZOCOR) 20 MG tablet take 1 tablet by mouth at bedtime  30 tablet  0  . tamsulosin (FLOMAX) 0.4 MG CAPS capsule take 1 capsule by mouth every morning  30 capsule  5   No current facility-administered medications on file prior to visit.    Review of Systems Review of Systems  Constitutional: Negative for fever, appetite change, and unexpected weight change. pos for fatigue and for heat intolerance  Eyes: Negative for pain and visual disturbance.  Respiratory: Negative for cough and wheeze , pos for his usual amount of sob   Cardiovascular: Negative for cp or  palpitations   neg for PND/ orthopnea or pedal edema  Gastrointestinal: Negative for nausea, diarrhea and constipation. pos for upper abdominal pain , neg for heartburn  Genitourinary: Negative for urgency and frequency. neg for dysuria or hematuria or flank pain  Skin: Negative for pallor or rash neg for flushing    Neurological: Negative for weakness, light-headedness,  and headaches. pos for baseline neuropathy symptoms  Hematological: Negative for adenopathy. Does not bruise/bleed easily.  Psychiatric/Behavioral: Negative for dysphoric mood. Pos for anxiety          Objective:   Physical Exam  Constitutional: He appears well-developed and well-nourished. No distress.  Obese and fatigued appearing  Very talkative   HENT:  Head: Normocephalic and atraumatic.  Right Ear: External ear normal.  Left Ear: External ear normal.  Nose: Nose normal.  Mouth/Throat: Oropharynx is clear and moist.  Nares are boggy No sinus tenderness   Eyes: Conjunctivae and EOM are normal. Pupils are equal, round, and reactive to light. Right eye exhibits no discharge. Left eye exhibits no discharge.  No scleral icterus.  Neck: Normal range of motion. Neck supple. No thyromegaly present.  Cardiovascular: Regular rhythm.  Exam reveals no gallop.   Murmur heard. Rate in mid 90s at rest   Pulmonary/Chest: Effort normal and breath sounds normal. No respiratory distress. He has no wheezes. He has no rales.  No crackles   Abdominal: Soft. Bowel sounds are normal. He exhibits no distension, no abdominal bruit, no pulsatile midline mass and no mass. There is no hepatosplenomegaly. There is tenderness in the epigastric area. There is no rebound, no guarding and no CVA tenderness.  Musculoskeletal: He exhibits edema. He exhibits no tenderness.  Trace pedal edema -not pitting  No leg/calf tenderness  Some varicosities Neg homans sign  Lymphadenopathy:    He has no cervical adenopathy.  Neurological: He is alert. He  has normal reflexes. He exhibits normal muscle tone. Coordination normal.  L sided bell's palsy (baseline per pt)  Skin: Skin is warm and dry. No rash noted. No erythema. No pallor.  Ruddy complexion  Psychiatric: His mood appears anxious.  Somewhat anxious, very talkative           Assessment & Plan:   Problem List Items Addressed This Visit     Cardiovascular and Mediastinum   Coronary artery disease - Primary     Pt denies cp or excess sob  EKG shows tachycardia but otherwise nl     Relevant Orders      EKG 12-Lead (Completed)      Glucose (CBG) (Completed)   Type 2 diabetes mellitus with vascular disease     Glucose is elevated this am - rev insulin protocol this am - and diet  Pt will continue to follow  Labs pending and ua is clear     Relevant Orders      Glucose (CBG) (Completed)      Urinalysis Dipstick (Completed)     Other   Abdominal pain, epigastric     Lab today for cbc, cmet, amylase and lipase      Relevant Orders      Comprehensive metabolic panel (Completed)      CBC with Differential (Completed)      Amylase (Completed)      Lipase (Completed)      Urinalysis Dipstick (Completed)

## 2014-02-18 NOTE — Assessment & Plan Note (Signed)
Glucose is elevated this am - rev insulin protocol this am - and diet  Pt will continue to follow  Labs pending and ua is clear

## 2014-02-18 NOTE — Assessment & Plan Note (Signed)
Lab today for cbc, cmet, amylase and lipase

## 2014-02-18 NOTE — Assessment & Plan Note (Signed)
Pt denies cp or excess sob  EKG shows tachycardia but otherwise nl

## 2014-02-22 ENCOUNTER — Ambulatory Visit (INDEPENDENT_AMBULATORY_CARE_PROVIDER_SITE_OTHER): Payer: Medicare Other | Admitting: Adult Health

## 2014-02-22 ENCOUNTER — Telehealth: Payer: Self-pay | Admitting: Internal Medicine

## 2014-02-22 ENCOUNTER — Encounter: Payer: Self-pay | Admitting: Adult Health

## 2014-02-22 VITALS — BP 123/79 | HR 82 | Temp 97.5°F | Resp 16 | Wt 273.2 lb

## 2014-02-22 DIAGNOSIS — I2510 Atherosclerotic heart disease of native coronary artery without angina pectoris: Secondary | ICD-10-CM

## 2014-02-22 DIAGNOSIS — N179 Acute kidney failure, unspecified: Secondary | ICD-10-CM | POA: Insufficient documentation

## 2014-02-22 DIAGNOSIS — R109 Unspecified abdominal pain: Secondary | ICD-10-CM

## 2014-02-22 LAB — BASIC METABOLIC PANEL
BUN: 17 mg/dL (ref 6–23)
CO2: 29 mEq/L (ref 19–32)
Calcium: 8.7 mg/dL (ref 8.4–10.5)
Chloride: 98 mEq/L (ref 96–112)
Creatinine, Ser: 1.4 mg/dL (ref 0.4–1.5)
GFR: 55.32 mL/min — ABNORMAL LOW (ref >60.00)
Glucose, Bld: 149 mg/dL — ABNORMAL HIGH (ref 70–99)
Potassium: 4.4 mEq/L (ref 3.5–5.1)
Sodium: 134 mEq/L — ABNORMAL LOW (ref 135–145)

## 2014-02-22 LAB — CBC WITH DIFFERENTIAL/PLATELET
Basophils Absolute: 0 10*3/uL (ref 0.0–0.1)
Basophils Relative: 0.4 % (ref 0.0–3.0)
Eosinophils Absolute: 0.2 10*3/uL (ref 0.0–0.7)
Eosinophils Relative: 1.9 % (ref 0.0–5.0)
HCT: 40.2 % (ref 39.0–52.0)
Hemoglobin: 13.4 g/dL (ref 13.0–17.0)
Lymphocytes Relative: 18.2 % (ref 12.0–46.0)
Lymphs Abs: 2.1 10*3/uL (ref 0.7–4.0)
MCHC: 33.3 g/dL (ref 30.0–36.0)
MCV: 88.8 fl (ref 78.0–100.0)
Monocytes Absolute: 0.5 10*3/uL (ref 0.1–1.0)
Monocytes Relative: 4.8 % (ref 3.0–12.0)
Neutro Abs: 8.5 10*3/uL — ABNORMAL HIGH (ref 1.4–7.7)
Neutrophils Relative %: 74.7 % (ref 43.0–77.0)
Platelets: 262 10*3/uL (ref 150.0–400.0)
RBC: 4.52 Mil/uL (ref 4.22–5.81)
RDW: 15.5 % (ref 11.5–15.5)
WBC: 11.4 10*3/uL — ABNORMAL HIGH (ref 4.0–10.5)

## 2014-02-22 NOTE — Telephone Encounter (Signed)
Relevant patient education assigned to patient using Emmi. ° °

## 2014-02-22 NOTE — Progress Notes (Signed)
Patient ID: [name] [name], male   DOB: [birth year], 58 y.o.   MRN: [id number]   Subjective:    Patient ID: [name] [name], male    DOB: 04/22/1956, 58 y.o.   MRN: [id number]  HPI  Pt is a pleasant 58 y/o male with multiple medical problems as listed below who presents to clinic for 4 day f/u of abdominal pain. Initially reported epigastric area, now in lower quadrants as well. Elevated WBC, creatinine is also elevated. He reports ongoing fatigue, sweating. Bowel movements more frequent (~3 daily) without diarrhea or blood noted. He reports that pain is slightly relieved with having a BM but not resolved. Denies fever or chills.    Past Medical History  Diagnosis Date  . Peripheral vascular disease   . Diabetes mellitus   . Mild aortic stenosis   . Tobacco use disorder     recently quit  . Depression   . Psoriasis   . Neuropathy   . Obesity   . Post splenectomy syndrome   . SOB (shortness of breath)   . Carotid artery occlusion   . Coronary artery disease   . Hyperlipidemia   . Hypertension   . Bell's palsy 2007  . Sleep apnea   . Angina at rest     chronic  . Abnormal colonoscopy 06/2011    Dr. Minna Merritts, polyp  . Cataract     Dr. Dawna Part  . Anxiety   . Arthritis   . DVT (deep venous thrombosis)     in leg  . Stroke   . Allergy   . Heart murmur   . Tubular adenoma 08/2013    Dr. Hilarie Fredrickson    Current Outpatient Prescriptions on File Prior to Visit  Medication Sig Dispense Refill  . amitriptyline (ELAVIL) 50 MG tablet Take 50 mg by mouth at bedtime.      [name] aspirin 81 MG tablet Take 81 mg by mouth daily.      [name] BAYER MICROLET LANCETS lancets use as directed three times a day  100 each  12  . buPROPion (WELLBUTRIN XL) 150 MG 24 hr tablet take 1 tablet by mouth once daily  30 tablet  6  . cholecalciferol (VITAMIN D) 1000 UNITS tablet Take 1,000 Units by mouth daily.        . Cinnamon 500 MG capsule Take 1,000 mg by mouth daily.      . clopidogrel (PLAVIX) 75 MG tablet Take 1  tablet (75 mg total) by mouth daily.  90 tablet  4  . cyclobenzaprine (FLEXERIL) 10 MG tablet Take 10 mg by mouth 2 (two) times daily as needed.       . diazepam (VALIUM) 2 MG tablet take 1 tablet by mouth three times a day  90 tablet  3  . diazepam (VALIUM) 5 MG tablet Take 1 tablet (5 mg total) by mouth at bedtime as needed for anxiety.  30 tablet  4  . doxycycline (VIBRA-TABS) 100 MG tablet Take 1 tablet (100 mg total) by mouth 2 (two) times daily.  20 tablet  0  . gentamicin ointment (GARAMYCIN) 0.1 % Apply 1 application topically 3 (three) times daily.  15 g  0  . Glucosamine-Chondroit-Vit C-Mn (GLUCOSAMINE CHONDR 500 COMPLEX) CAPS Take 1,000 each by mouth daily.      [name] glucose blood (BAYER CONTOUR TEST) test strip Use to test blood sugar three times daily as instructed.  Dx code: 250.70  100 each  5  . HYDROcodone-acetaminophen (NORCO/VICODIN) 5-325  MG per tablet Take 1-2 tablets by mouth every 6 (six) hours as needed for moderate pain or severe pain.  90 tablet  0  . insulin NPH Human (HUMULIN N,NOVOLIN N) 100 UNIT/ML injection Inject under skin 35 units 2x a day: in am and before dinner  30 mL  11  . Insulin Pen Needle (NOVOFINE) 32G X 6 MM MISC Use three times daily  100 each  5  . insulin regular (NOVOLIN R,HUMULIN R) 100 units/mL injection Inject 0.15-0.2 mLs (15-20 Units total) into the skin 3 (three) times daily before meals.  20 mL  11  . Insulin Syringe-Needle U-100 (RELION INSULIN SYRINGE 1ML/31G) 31G X 5/16" 1 ML MISC Use 3x a day  200 each  11  . Lactulose 20 GM/30ML SOLN Take 30 mLs by mouth every 2 (two) hours as needed.      Elmore Guise Device MISC 1 application by Does not apply route as needed.  100 each  11  . losartan (COZAAR) 25 MG tablet Take 1 tablet (25 mg total) by mouth daily.  90 tablet  3  . magnesium gluconate (MAGONATE) 500 MG tablet Take 1,000 mg by mouth daily.      . metoprolol succinate (TOPROL-XL) 50 MG 24 hr tablet take 1 tablet by mouth twice a day  60 tablet   5  . Multiple Vitamin (MULTIVITAMIN) capsule Take 1 capsule by mouth daily.        . nitazoxanide (ALINIA) 500 MG tablet Take 1 tablet (500 mg total) by mouth 2 (two) times daily with a meal.  20 tablet  0  . nitroGLYCERIN (NITROSTAT) 0.4 MG SL tablet Place 1 tablet (0.4 mg total) under the tongue every 5 (five) minutes as needed.  25 tablet  1  . omeprazole (PRILOSEC) 20 MG capsule Take 1 capsule (20 mg total) by mouth 2 (two) times daily.  20 capsule  0  . polyethylene glycol (MIRALAX) packet Take 17 g by mouth daily.  14 each  3  . sertraline (ZOLOFT) 50 MG tablet take 1 tablet by mouth once daily  30 tablet  4  . simvastatin (ZOCOR) 20 MG tablet take 1 tablet by mouth at bedtime  30 tablet  0  . tamsulosin (FLOMAX) 0.4 MG CAPS capsule take 1 capsule by mouth every morning  30 capsule  5   No current facility-administered medications on file prior to visit.     Review of Systems  Constitutional: Positive for fatigue. Negative for fever and chills.  Respiratory: Negative for cough, chest tightness and shortness of breath.   Cardiovascular: Negative for chest pain, palpitations and leg swelling.  Gastrointestinal: Positive for abdominal pain (increasing BMs in last week). Negative for nausea, vomiting, diarrhea, constipation and blood in stool.  Genitourinary: Negative for dysuria, urgency and frequency.       Reports urinating ~ 3 times daily  Neurological: Negative.   Psychiatric/Behavioral: Negative.   All other systems reviewed and are negative.      Objective:  BP 123/79  Pulse 82  Temp(Src) 97.5 F (36.4 C) (Oral)  Resp 16  Wt 273 lb 4 oz (123.945 kg)  SpO2 96%   Physical Exam  Constitutional: He is oriented to person, place, and time.  Overweight, pleasant 58 y/o male  Cardiovascular: Normal rate and regular rhythm.   Murmur heard. Pulmonary/Chest: Effort normal and breath sounds normal. No respiratory distress. He has no wheezes. He has no rales.  Abdominal: Soft.  There is tenderness (epigastric,  LLQ with deep palpation). There is no guarding.  Large, rounded abdomen.  Musculoskeletal:  Decreased ROM 2/2 body habitus (obesity)  Neurological: He is alert and oriented to person, place, and time.  Skin: Skin is warm and dry.  Psychiatric: He has a normal mood and affect. His behavior is normal. Judgment and thought content normal.      Assessment & Plan:   1. Abdominal pain, unspecified site Pain in epigastric area and also LLQ. Moving bowels more frequently. I am sending him for CT to r/o diverticulitis. Also, pt had elevated WBC 4 days ago. I am repeating this today as well as renal function. - CBC with Differential - Basic metabolic panel - CT Abdomen Pelvis Wo Contrast; Future  2. Acute renal failure, unspecified acute renal failure type Creatinine elevated and GFR is decreased. Hx of diabetes with complications. Referring to Nephrology. - Ambulatory referral to Nephrology

## 2014-02-22 NOTE — Progress Notes (Signed)
Pre visit review using our clinic review tool, if applicable. No additional management support is needed unless otherwise documented below in the visit note. 

## 2014-02-23 ENCOUNTER — Ambulatory Visit: Payer: Self-pay | Admitting: Adult Health

## 2014-02-23 DIAGNOSIS — K5730 Diverticulosis of large intestine without perforation or abscess without bleeding: Secondary | ICD-10-CM | POA: Diagnosis not present

## 2014-02-23 DIAGNOSIS — N289 Disorder of kidney and ureter, unspecified: Secondary | ICD-10-CM | POA: Diagnosis not present

## 2014-02-23 DIAGNOSIS — N200 Calculus of kidney: Secondary | ICD-10-CM | POA: Diagnosis not present

## 2014-02-24 ENCOUNTER — Ambulatory Visit (INDEPENDENT_AMBULATORY_CARE_PROVIDER_SITE_OTHER): Payer: Medicare Other | Admitting: Psychology

## 2014-02-24 DIAGNOSIS — F449 Dissociative and conversion disorder, unspecified: Secondary | ICD-10-CM | POA: Diagnosis not present

## 2014-02-24 DIAGNOSIS — F331 Major depressive disorder, recurrent, moderate: Secondary | ICD-10-CM

## 2014-02-24 NOTE — Telephone Encounter (Signed)
Mailed unread message to pt  

## 2014-02-25 ENCOUNTER — Telehealth: Payer: Self-pay | Admitting: Internal Medicine

## 2014-02-25 NOTE — Telephone Encounter (Signed)
CT abdomen was normal except for a few small lymph nodes and a small kidney stone on the left. We should set up follow up in clinic if symptoms of abdominal pain are persistent

## 2014-02-25 NOTE — Telephone Encounter (Signed)
Let's see him in follow up first.

## 2014-02-25 NOTE — Telephone Encounter (Signed)
Notified pt, Pt states that he will call the office if he feels a f/u is necessary.  Pt also states that Ortho is concerned about giving injection into knee due to recent WBC, and wanted your opinion if okay to proceed with getting them.

## 2014-02-26 ENCOUNTER — Ambulatory Visit: Payer: Medicare Other | Admitting: Podiatry

## 2014-03-01 DIAGNOSIS — L408 Other psoriasis: Secondary | ICD-10-CM | POA: Diagnosis not present

## 2014-03-01 DIAGNOSIS — L719 Rosacea, unspecified: Secondary | ICD-10-CM | POA: Diagnosis not present

## 2014-03-01 NOTE — Telephone Encounter (Signed)
Notified pt. 

## 2014-03-02 ENCOUNTER — Ambulatory Visit (INDEPENDENT_AMBULATORY_CARE_PROVIDER_SITE_OTHER): Payer: Medicare Other | Admitting: Internal Medicine

## 2014-03-02 ENCOUNTER — Ambulatory Visit: Payer: Medicare Other | Admitting: Psychology

## 2014-03-02 ENCOUNTER — Encounter: Payer: Self-pay | Admitting: Internal Medicine

## 2014-03-02 VITALS — BP 130/80 | HR 76 | Temp 97.9°F | Ht 67.0 in | Wt 273.5 lb

## 2014-03-02 VITALS — BP 118/64 | HR 84 | Temp 98.7°F | Resp 12 | Wt 275.0 lb

## 2014-03-02 DIAGNOSIS — R1013 Epigastric pain: Secondary | ICD-10-CM

## 2014-03-02 DIAGNOSIS — I10 Essential (primary) hypertension: Secondary | ICD-10-CM | POA: Diagnosis not present

## 2014-03-02 DIAGNOSIS — E1159 Type 2 diabetes mellitus with other circulatory complications: Secondary | ICD-10-CM

## 2014-03-02 DIAGNOSIS — I798 Other disorders of arteries, arterioles and capillaries in diseases classified elsewhere: Secondary | ICD-10-CM

## 2014-03-02 DIAGNOSIS — D72829 Elevated white blood cell count, unspecified: Secondary | ICD-10-CM | POA: Diagnosis not present

## 2014-03-02 DIAGNOSIS — I2510 Atherosclerotic heart disease of native coronary artery without angina pectoris: Secondary | ICD-10-CM | POA: Diagnosis not present

## 2014-03-02 DIAGNOSIS — R42 Dizziness and giddiness: Secondary | ICD-10-CM

## 2014-03-02 LAB — CBC WITH DIFFERENTIAL/PLATELET
Basophils Absolute: 0 10*3/uL (ref 0.0–0.1)
Basophils Relative: 0.4 % (ref 0.0–3.0)
Eosinophils Absolute: 0.2 10*3/uL (ref 0.0–0.7)
Eosinophils Relative: 1.8 % (ref 0.0–5.0)
HCT: 39.2 % (ref 39.0–52.0)
Hemoglobin: 12.9 g/dL — ABNORMAL LOW (ref 13.0–17.0)
Lymphocytes Relative: 17.1 % (ref 12.0–46.0)
Lymphs Abs: 1.9 10*3/uL (ref 0.7–4.0)
MCHC: 32.9 g/dL (ref 30.0–36.0)
MCV: 89.6 fl (ref 78.0–100.0)
Monocytes Absolute: 0.7 10*3/uL (ref 0.1–1.0)
Monocytes Relative: 6.3 % (ref 3.0–12.0)
Neutro Abs: 8.3 10*3/uL — ABNORMAL HIGH (ref 1.4–7.7)
Neutrophils Relative %: 74.4 % (ref 43.0–77.0)
Platelets: 268 10*3/uL (ref 150.0–400.0)
RBC: 4.38 Mil/uL (ref 4.22–5.81)
RDW: 15.8 % — ABNORMAL HIGH (ref 11.5–15.5)
WBC: 11.2 10*3/uL — ABNORMAL HIGH (ref 4.0–10.5)

## 2014-03-02 LAB — COMPREHENSIVE METABOLIC PANEL
ALT: 21 U/L (ref 0–53)
AST: 15 U/L (ref 0–37)
Albumin: 3.4 g/dL — ABNORMAL LOW (ref 3.5–5.2)
Alkaline Phosphatase: 115 U/L (ref 39–117)
BUN: 22 mg/dL (ref 6–23)
CO2: 28 mEq/L (ref 19–32)
Calcium: 9.1 mg/dL (ref 8.4–10.5)
Chloride: 101 mEq/L (ref 96–112)
Creatinine, Ser: 1.5 mg/dL (ref 0.4–1.5)
GFR: 49.15 mL/min — ABNORMAL LOW (ref >60.00)
Glucose, Bld: 133 mg/dL — ABNORMAL HIGH (ref 70–99)
Potassium: 5.1 mEq/L (ref 3.5–5.1)
Sodium: 137 mEq/L (ref 135–145)
Total Bilirubin: 0.5 mg/dL (ref 0.2–1.2)
Total Protein: 7.9 g/dL (ref 6.0–8.3)

## 2014-03-02 LAB — HEMOGLOBIN A1C: Hgb A1c MFr Bld: 8.3 % — ABNORMAL HIGH (ref 4.6–6.5)

## 2014-03-02 LAB — LIPID PANEL
Cholesterol: 98 mg/dL (ref 0–200)
HDL: 23.3 mg/dL — ABNORMAL LOW (ref >39.00)
LDL Cholesterol: 39 mg/dL (ref 0–99)
NonHDL: 74.7
Total CHOL/HDL Ratio: 4
Triglycerides: 177 mg/dL — ABNORMAL HIGH (ref 0.0–149.0)
VLDL: 35.4 mg/dL (ref 0.0–40.0)

## 2014-03-02 MED ORDER — INSULIN NPH (HUMAN) (ISOPHANE) 100 UNIT/ML ~~LOC~~ SUSP: SUBCUTANEOUS | Status: DC

## 2014-03-02 MED ORDER — INSULIN REGULAR HUMAN 100 UNIT/ML IJ SOLN: 20.0000 [IU] | Freq: Three times a day (TID) | INTRAMUSCULAR | Status: DC

## 2014-03-02 NOTE — Patient Instructions (Addendum)
Labs today.  Please call vascular to set up earlier carotid dopplers.  Follow up in 2 weeks.

## 2014-03-02 NOTE — Progress Notes (Signed)
Pre visit review using our clinic review tool, if applicable. No additional management support is needed unless otherwise documented below in the visit note. 

## 2014-03-02 NOTE — Assessment & Plan Note (Signed)
Symptoms likely related to recent viral infection, however will have him talk with Vascular about repeating carotid dopplers this week, given his extensive h/o atherosclerosis.

## 2014-03-02 NOTE — Patient Instructions (Signed)
Please change the dose of N and R insulins as follows:  Insulin Before breakfast Before lunch Before dinner  Regular (short acting) 35 20 35  NPH (long acting) 25  20  Please return in 1.5 month with your sugar log.

## 2014-03-02 NOTE — Assessment & Plan Note (Signed)
BP Readings from Last 3 Encounters:  03/02/14 118/64  03/02/14 130/80  02/22/14 123/79   BP well controlled on current medications. Will continue.

## 2014-03-02 NOTE — Progress Notes (Signed)
Subjective:    Patient ID: [name] [name], male    DOB: April 13, 1956, 58 y.o.   MRN: [id number]  HPI 58YO male presents for follow up.  Feeling tired since May 2015. Decreased appetite. Had some dizziness and epigastric abdominal pain in 7.2015. Seen by Dr. Glori Bickers. Lab eval normal except for mild elevation of WBC. Seen by NP here 02/22/2014. Symptoms were generally improving. WBC improving. CT abdomen was normal except for a few small lymph nodes noted. Continues to have some episodes of dizziness, described as uneasiness. No further abdominal pain. No NVD.  No other focal symptoms such as chest pain or dyspnea. Appetite generally good recently. Scheduled to have carotids rechecked next month with vascular.  Scheduled to have steroid shots in left knee with orthopedics.   Review of Systems  Constitutional: Positive for fatigue. Negative for fever, chills, activity change, appetite change and unexpected weight change.  Eyes: Negative for visual disturbance.  Respiratory: Negative for cough and shortness of breath.   Cardiovascular: Negative for chest pain, palpitations and leg swelling.  Gastrointestinal: Negative for nausea, vomiting, abdominal pain, diarrhea, constipation, blood in stool and abdominal distention.  Genitourinary: Negative for dysuria, urgency and difficulty urinating.  Musculoskeletal: Negative for arthralgias and gait problem.  Skin: Negative for color change and rash.  Neurological: Positive for dizziness and light-headedness. Negative for tremors, syncope, facial asymmetry, speech difficulty, weakness, numbness and headaches.  Hematological: Negative for adenopathy.  Psychiatric/Behavioral: Negative for sleep disturbance and dysphoric mood. The patient is not nervous/anxious.        Objective:    BP 130/80  Pulse 76  Temp(Src) 97.9 F (36.6 C) (Oral)  Ht 5\' 7"  (1.702 m)  Wt 273 lb 8 oz (124.059 kg)  BMI 42.83 kg/m2  SpO2 97% Physical Exam    Constitutional: He is oriented to person, place, and time. He appears well-developed and well-nourished. No distress.  HENT:  Head: Normocephalic and atraumatic.  Right Ear: External ear normal.  Left Ear: External ear normal.  Nose: Nose normal.  Mouth/Throat: Oropharynx is clear and moist. No oropharyngeal exudate.  Eyes: Conjunctivae and EOM are normal. Pupils are equal, round, and reactive to light. Right eye exhibits no discharge. Left eye exhibits no discharge. No scleral icterus.  Neck: Normal range of motion. Neck supple. No tracheal deviation present. No thyromegaly present.  Cardiovascular: Normal rate, regular rhythm and normal heart sounds.  Exam reveals no gallop and no friction rub.   No murmur heard. Pulmonary/Chest: Effort normal and breath sounds normal. No accessory muscle usage. Not tachypneic. No respiratory distress. He has no decreased breath sounds. He has no wheezes. He has no rhonchi. He has no rales. He exhibits no tenderness.  Abdominal: Soft. Bowel sounds are normal. He exhibits no distension and no mass. There is tenderness (mild LUQ and RUQ). There is no rebound and no guarding.  Musculoskeletal: Normal range of motion. He exhibits no edema.  Lymphadenopathy:    He has no cervical adenopathy.  Neurological: He is alert and oriented to person, place, and time. No cranial nerve deficit. Coordination normal.  Skin: Skin is warm and dry. No rash noted. He is not diaphoretic. No erythema. No pallor.  Psychiatric: He has a normal mood and affect. His behavior is normal. Judgment and thought content normal.          Assessment & Plan:   Problem List Items Addressed This Visit     Unprioritized   Abdominal pain, epigastric - Primary  Recent epigastric abdominal pain, now improved. Labs including CBC, CMP, amy/lip were normal except for mild leukocytosis. Will repeat CBC today. CT abdomen showed no acute findings. Suspect viral infection may have led to  symptoms. Will continue to monitor for now.    Dizziness and giddiness     Symptoms likely related to recent viral infection, however will have him talk with Vascular about repeating carotid dopplers this week, given his extensive h/o atherosclerosis.    Hypertension      BP Readings from Last 3 Encounters:  03/02/14 118/64  03/02/14 130/80  02/22/14 123/79   BP well controlled on current medications. Will continue.    Leukocytosis, unspecified     Leukocytosis likely secondary to viral infection. Will repeat CBC with labs today.    Relevant Orders      CBC with Differential   Type 2 diabetes mellitus with vascular disease      Lab Results  Component Value Date   HGBA1C 9.5* 11/23/2013    Will recheck A1c with labs today. Follow up with Dr. Cruzita Lederer as scheduled.    Relevant Orders      Comprehensive metabolic panel      Hemoglobin A1c      Lipid panel      Microalbumin / creatinine urine ratio       Return in about 2 weeks (around 03/16/2014).

## 2014-03-02 NOTE — Assessment & Plan Note (Signed)
Lab Results  Component Value Date   HGBA1C 9.5* 11/23/2013    Will recheck A1c with labs today. Follow up with Dr. Cruzita Lederer as scheduled.

## 2014-03-02 NOTE — Progress Notes (Signed)
Patient ID: [name] [name], male   DOB: 1956-06-09, 58 y.o.   MRN: [id number]  HPI: [name] [name] is a 58 y.o.-year-old male, returning for f/u for DM2, dx 2006, insulin-dependent since 2011, uncontrolled, with complications (CAD - CABG 2012, PVD - s/p Stents, Aortic and carotid stenosis, peripheral neuropathy). Last visit 1 mo ago.  He was found to have a high count and had a suspected vital infection. He feels better recently, no more confusion.  He had a cortisone inj 1 week ago.  Last hemoglobin A1c was checked today - pending: Lab Results  Component Value Date   HGBA1C 9.5* 11/23/2013   HGBA1C 9.4* 09/16/2013   HGBA1C 9.4* 05/18/2013  He had 2 cortisone injections in knees: 06/04 and 06/23.  Pt was on a regimen of: - Lantus 60 units bid >> 60 units in HS >> 80 units in HS - Novolog 14 >> 17 >> 25-25-30 We stopped Victoza at a previous visit. He could not afford his insulins (doughnut hole) - 400$.   He is now on: Insulin Before breakfast Before lunch Before dinner  NPH (long acting) 20 20 15   Regular (short acting) 35  35  His wife mixed the insulin backwards!!!! - should have been N 35 bid and R 20-20-15.  Pt checks his sugars 3x a day and they are (forgot log) much improved: - am: (120) 192-386 >> 150-200 >> 190-510 (200-300s) >> 132-357 (most <280) >> 125-378 (457) >> 150-180 - 2h after b'fast: 150-300 >> cannot remember >> 179-449 >> 226-271 (348) >> 198, 239 >> n/c - before lunch: n/c >> 269-325 >> 53 x1 >> 70, 102 >> 80 (after cutting grass that am)-120 - 2h after lunch: n/c >> 211-462 >> 121, 174 >> 84-300 (332 x1) >> n/c - before dinner: >200 >> 257-323 >> 86-231 (282) >> 53 x1, 122-352 >> 120-150 - 2h after dinner: n/c >> 268-290 >> 173, 223, 234 >> n/c - bedtime: 280->400 >> 210-220 >> 242, 319 >> 265 >> n/c >> 127-135 No lows. Lowest sugar was 80; ? At what level he has hypoglycemia awareness. Highest sugar was 457 >> 300 x1 now, seldom 200s.  Pt's meals are: -  Breakfast: toast + eggs or cereals + milk - Lunch: soup + 1/2 sandwich - Dinner: meat + sweet potatoes or pasta + salad - Snacks: 3: sugar free foods, fruit He quit drinking sodas, but drinks tea - artificial sweetener. He also started to drink Almond milk. Exercises by walking 2x a day.  - He has CKD, last BUN/creatinine:  Lab Results  Component Value Date   BUN 17 02/22/2014   CREATININE 1.4 02/22/2014  He is on Cozaar. - last set of lipids: Lab Results  Component Value Date   CHOL 111 11/23/2013   HDL 24.20* 11/23/2013   LDLCALC 32 11/23/2013   LDLDIRECT 66.9 05/18/2013   TRIG 274.0* 11/23/2013   CHOLHDL 5 11/23/2013  He  Is on Zocor. - last eye exam was in 04/2013 [facility] - Dr Violet Baldy ? DR. + cataracts. - + numbness and tingling in his feet - Dr Normal Regal, but will go to [facility]) - Dr Elvina Mattes. He had a foot exam 01/11/2014.   I reviewed pt's medications, allergies, PMH, social hx, family hx and no changes required, except as mentioned above.  ROS: Constitutional: + weight gain and loss , + fatigue, + nocturia, + feeling hot and cold, no nocturia Eyes: + blurry vision, no xerophthalmia ENT: +  sore throat, no nodules palpated in throat, + dysphagia/no odynophagia,+  hoarseness, + decreased hearing Cardiovascular: no CP/SOB/no palpitations/leg swelling Respiratory: no cough/SOB Gastrointestinal: no N/V/D/C Musculoskeletal: no muscle aches/+ bone pain  Skin: + rashes,+ hair loss Neurological: no tremors/no numbness/tingling/dizziness + diff with erections, low libido  PE: BP 118/64  Pulse 84  Temp(Src) 98.7 F (37.1 C) (Oral)  Resp 12  Wt 275 lb (124.739 kg)  SpO2 97% Wt Readings from Last 3 Encounters:  03/02/14 275 lb (124.739 kg)  03/02/14 273 lb 8 oz (124.059 kg)  02/22/14 273 lb 4 oz (123.945 kg)   Constitutional: obese, in NAD, ruddy complexion Eyes: PERRLA, EOMI, no exophthalmos ENT: dry mucous membranes, no thyromegaly, no cervical  lymphadenopathy Cardiovascular: RRR, +2/6 SEM, no RG Respiratory: CTA B Gastrointestinal: abdomen soft, NT, ND, BS+ Musculoskeletal: no deformities, strength intact in all 4 Skin: moist, warm, + rash - rosacea on face and psoriasis rash on knees  Neurological: no tremor with outstretched hands, DTR normal in all 4  ASSESSMENT: 1. DM2, insulin-dependent, uncontrolled, with complications - CAD - s/p CABG 2012 - PVD - s/p stents 2012 - Aortic stenosis - carotid stenosis - CKD - peripheral neuropathy   PLAN:  1. Patient with long-standing, uncontrolled diabetes, on injectable regimen - his sugars are a much improved after switching to the R and N insulins, despite the fact that his wife switched the N with R... Will change the regimen to get 2x a day N and 3x a day R: Patient Instructions  Please change the dose of N and R insulins as follows:  Insulin Before breakfast Before lunch Before dinner  Regular (short acting) 35 20 35  NPH (long acting) 25  20  Please return in 1.5 month with your sugar log.  - continue to check 3 times a day, rotating checks - up to date with yearly eye exams >> he is UTD - PCP ordered the HbA1c today >> will await the results - Return to clinic in 1.5 mo with sugar log   Lab Results  Component Value Date   HGBA1C 8.3* 03/02/2014   A1c better!

## 2014-03-02 NOTE — Assessment & Plan Note (Signed)
Leukocytosis likely secondary to viral infection. Will repeat CBC with labs today.

## 2014-03-02 NOTE — Assessment & Plan Note (Signed)
Recent epigastric abdominal pain, now improved. Labs including CBC, CMP, amy/lip were normal except for mild leukocytosis. Will repeat CBC today. CT abdomen showed no acute findings. Suspect viral infection may have led to symptoms. Will continue to monitor for now.

## 2014-03-03 ENCOUNTER — Ambulatory Visit (INDEPENDENT_AMBULATORY_CARE_PROVIDER_SITE_OTHER): Payer: Medicare Other | Admitting: Psychology

## 2014-03-03 ENCOUNTER — Telehealth: Payer: Self-pay

## 2014-03-03 DIAGNOSIS — F331 Major depressive disorder, recurrent, moderate: Secondary | ICD-10-CM | POA: Diagnosis not present

## 2014-03-03 DIAGNOSIS — F449 Dissociative and conversion disorder, unspecified: Secondary | ICD-10-CM | POA: Diagnosis not present

## 2014-03-03 LAB — MICROALBUMIN / CREATININE URINE RATIO
Creatinine,U: 77.7 mg/dL
Microalb Creat Ratio: 11.6 mg/g (ref 0.0–30.0)
Microalb, Ur: 9 mg/dL — ABNORMAL HIGH (ref 0.0–1.9)

## 2014-03-03 NOTE — Telephone Encounter (Signed)
Would you mind calling and asking if Vascular can work him in earlier for a carotid doppler?

## 2014-03-03 NOTE — Telephone Encounter (Signed)
The patient came in and asked that message be relayed to you:  Dr.Schnier (his vascular dr) could not see the patient this month.  The patient has an apt at the end of Sept.  Mr.Rottenberg mentioned you recommended he see Dr.Schnier asap.  He mentioned he is open to seeing other doctors.  Do you want him to be referred to a different dr?

## 2014-03-10 ENCOUNTER — Ambulatory Visit: Payer: Medicare Other | Admitting: Psychology

## 2014-03-16 ENCOUNTER — Ambulatory Visit: Payer: Medicare Other | Admitting: Psychology

## 2014-03-16 DIAGNOSIS — M2240 Chondromalacia patellae, unspecified knee: Secondary | ICD-10-CM | POA: Diagnosis not present

## 2014-03-16 DIAGNOSIS — M25569 Pain in unspecified knee: Secondary | ICD-10-CM | POA: Diagnosis not present

## 2014-03-16 DIAGNOSIS — M1710 Unilateral primary osteoarthritis, unspecified knee: Secondary | ICD-10-CM | POA: Diagnosis not present

## 2014-03-17 ENCOUNTER — Ambulatory Visit (INDEPENDENT_AMBULATORY_CARE_PROVIDER_SITE_OTHER): Payer: Medicare Other | Admitting: Psychology

## 2014-03-17 DIAGNOSIS — F331 Major depressive disorder, recurrent, moderate: Secondary | ICD-10-CM | POA: Diagnosis not present

## 2014-03-17 DIAGNOSIS — F449 Dissociative and conversion disorder, unspecified: Secondary | ICD-10-CM

## 2014-03-19 ENCOUNTER — Encounter: Payer: Self-pay | Admitting: Internal Medicine

## 2014-03-19 ENCOUNTER — Ambulatory Visit (INDEPENDENT_AMBULATORY_CARE_PROVIDER_SITE_OTHER): Payer: Medicare Other | Admitting: Internal Medicine

## 2014-03-19 VITALS — BP 110/70 | HR 77 | Temp 98.3°F | Ht 67.0 in | Wt 277.5 lb

## 2014-03-19 DIAGNOSIS — F419 Anxiety disorder, unspecified: Secondary | ICD-10-CM

## 2014-03-19 DIAGNOSIS — Z23 Encounter for immunization: Secondary | ICD-10-CM | POA: Diagnosis not present

## 2014-03-19 DIAGNOSIS — I798 Other disorders of arteries, arterioles and capillaries in diseases classified elsewhere: Secondary | ICD-10-CM | POA: Diagnosis not present

## 2014-03-19 DIAGNOSIS — E1159 Type 2 diabetes mellitus with other circulatory complications: Secondary | ICD-10-CM

## 2014-03-19 DIAGNOSIS — N139 Obstructive and reflux uropathy, unspecified: Secondary | ICD-10-CM | POA: Insufficient documentation

## 2014-03-19 DIAGNOSIS — F411 Generalized anxiety disorder: Secondary | ICD-10-CM

## 2014-03-19 DIAGNOSIS — I2510 Atherosclerotic heart disease of native coronary artery without angina pectoris: Secondary | ICD-10-CM | POA: Diagnosis not present

## 2014-03-19 MED ORDER — SERTRALINE HCL 50 MG PO TABS: 50.0000 mg | ORAL_TABLET | Freq: Every day | ORAL | Status: DC

## 2014-03-19 MED ORDER — SIMVASTATIN 20 MG PO TABS: 20.0000 mg | ORAL_TABLET | Freq: Every day | ORAL | Status: DC

## 2014-03-19 MED ORDER — INSULIN REGULAR HUMAN 100 UNIT/ML IJ SOLN: 15.0000 [IU] | Freq: Three times a day (TID) | INTRAMUSCULAR | Status: DC

## 2014-03-19 MED ORDER — DIAZEPAM 2 MG PO TABS: ORAL_TABLET | ORAL | Status: DC

## 2014-03-19 MED ORDER — DIAZEPAM 5 MG PO TABS: 5.0000 mg | ORAL_TABLET | Freq: Every evening | ORAL | Status: DC | PRN

## 2014-03-19 NOTE — Patient Instructions (Signed)
Follow-up in 4 weeks

## 2014-03-19 NOTE — Assessment & Plan Note (Signed)
Will set up second opinion with urology to evaluate urinary obstruction. Will also have pt follow up with nephrology next week.

## 2014-03-19 NOTE — Assessment & Plan Note (Signed)
Symptoms well controlled on current doses of Diazepam, Sertraline and wellbutrin. Will continue.
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
## 2014-03-19 NOTE — Assessment & Plan Note (Signed)
Lab Results  Component Value Date   HGBA1C 8.3* 03/02/2014   Encouraged continued efforts at healthy diet and exercise. Continue current medications. Follow up with Dr. Cruzita Lederer as scheduled.

## 2014-03-19 NOTE — Progress Notes (Signed)
Pre visit review using our clinic review tool, if applicable. No additional management support is needed unless otherwise documented below in the visit note. 

## 2014-03-22 DIAGNOSIS — N183 Chronic kidney disease, stage 3 unspecified: Secondary | ICD-10-CM | POA: Diagnosis not present

## 2014-03-22 DIAGNOSIS — D631 Anemia in chronic kidney disease: Secondary | ICD-10-CM | POA: Diagnosis not present

## 2014-03-22 DIAGNOSIS — I10 Essential (primary) hypertension: Secondary | ICD-10-CM | POA: Diagnosis not present

## 2014-03-22 DIAGNOSIS — N2581 Secondary hyperparathyroidism of renal origin: Secondary | ICD-10-CM | POA: Diagnosis not present

## 2014-03-22 DIAGNOSIS — E1129 Type 2 diabetes mellitus with other diabetic kidney complication: Secondary | ICD-10-CM | POA: Diagnosis not present

## 2014-03-22 DIAGNOSIS — N179 Acute kidney failure, unspecified: Secondary | ICD-10-CM | POA: Diagnosis not present

## 2014-03-22 DIAGNOSIS — N039 Chronic nephritic syndrome with unspecified morphologic changes: Secondary | ICD-10-CM | POA: Diagnosis not present

## 2014-03-24 ENCOUNTER — Ambulatory Visit: Payer: Medicare Other | Admitting: Psychology

## 2014-03-24 DIAGNOSIS — M2390 Unspecified internal derangement of unspecified knee: Secondary | ICD-10-CM | POA: Diagnosis not present

## 2014-03-24 DIAGNOSIS — M1710 Unilateral primary osteoarthritis, unspecified knee: Secondary | ICD-10-CM | POA: Diagnosis not present

## 2014-03-24 DIAGNOSIS — M25569 Pain in unspecified knee: Secondary | ICD-10-CM | POA: Diagnosis not present

## 2014-03-24 DIAGNOSIS — E1129 Type 2 diabetes mellitus with other diabetic kidney complication: Secondary | ICD-10-CM | POA: Diagnosis not present

## 2014-03-24 DIAGNOSIS — M2240 Chondromalacia patellae, unspecified knee: Secondary | ICD-10-CM | POA: Diagnosis not present

## 2014-03-24 DIAGNOSIS — N183 Chronic kidney disease, stage 3 unspecified: Secondary | ICD-10-CM | POA: Diagnosis not present

## 2014-03-24 DIAGNOSIS — D631 Anemia in chronic kidney disease: Secondary | ICD-10-CM | POA: Diagnosis not present

## 2014-03-24 DIAGNOSIS — I10 Essential (primary) hypertension: Secondary | ICD-10-CM | POA: Diagnosis not present

## 2014-03-24 DIAGNOSIS — N179 Acute kidney failure, unspecified: Secondary | ICD-10-CM | POA: Diagnosis not present

## 2014-03-24 DIAGNOSIS — N2581 Secondary hyperparathyroidism of renal origin: Secondary | ICD-10-CM | POA: Diagnosis not present

## 2014-03-24 DIAGNOSIS — E1165 Type 2 diabetes mellitus with hyperglycemia: Secondary | ICD-10-CM | POA: Diagnosis not present

## 2014-03-25 ENCOUNTER — Encounter: Payer: Self-pay | Admitting: Cardiovascular Disease

## 2014-03-25 ENCOUNTER — Ambulatory Visit (INDEPENDENT_AMBULATORY_CARE_PROVIDER_SITE_OTHER): Payer: Medicare Other | Admitting: Cardiovascular Disease

## 2014-03-25 VITALS — BP 118/72 | HR 72 | Ht 65.0 in | Wt 276.5 lb

## 2014-03-25 DIAGNOSIS — I359 Nonrheumatic aortic valve disorder, unspecified: Secondary | ICD-10-CM | POA: Diagnosis not present

## 2014-03-25 DIAGNOSIS — I209 Angina pectoris, unspecified: Secondary | ICD-10-CM | POA: Diagnosis not present

## 2014-03-25 DIAGNOSIS — I350 Nonrheumatic aortic (valve) stenosis: Secondary | ICD-10-CM

## 2014-03-25 DIAGNOSIS — R0602 Shortness of breath: Secondary | ICD-10-CM | POA: Diagnosis not present

## 2014-03-25 DIAGNOSIS — I25119 Atherosclerotic heart disease of native coronary artery with unspecified angina pectoris: Secondary | ICD-10-CM

## 2014-03-25 DIAGNOSIS — I2510 Atherosclerotic heart disease of native coronary artery without angina pectoris: Secondary | ICD-10-CM | POA: Diagnosis not present

## 2014-03-25 DIAGNOSIS — I10 Essential (primary) hypertension: Secondary | ICD-10-CM

## 2014-03-25 DIAGNOSIS — R000 Tachycardia, unspecified: Secondary | ICD-10-CM

## 2014-03-25 DIAGNOSIS — E785 Hyperlipidemia, unspecified: Secondary | ICD-10-CM

## 2014-03-25 NOTE — Progress Notes (Signed)
HPI  This is a 58 year old male who is here today for a followup visit. He has known history of coronary artery disease status post drug-eluting stent placements to the LAD in 2011. Repeat cardiac catheterization in 2012 showed significant restenosis with progression of three-vessel disease. He underwent coronary artery bypass graft surgery at that time. He also had left carotid endarterectomy at the same time. He has multiple medical conditions that include type 2 diabetes, hypertension, hyperlipidemia, mild aortic stenosis and peripheral arterial disease which is being followed by Dr. Ronalee Belts. Most recent stress test was done in July of 2013 which showed no evidence of ischemia. Echocardiogram in July 2013 showed normal LV systolic function with mild aortic stenosis.  He gets occasional and not frequent episodes of brief chest pain. No worsening dyspnea. He continues to complain of fatigue. He does not do any regular exercise. He is limited by arthritis. He had a recent episode of dizziness with mild sinus tachycardia. His blood sugar was elevated.  Allergies  Allergen Reactions  . Glipizide Other (See Comments)    ANTIDIABETICS. Burning  . Metrizamide     Got very hot and red  . Penicillins Hives and Swelling  . Contrast Media [Iodinated Diagnostic Agents] Rash    Got very hot and red     Current Outpatient Prescriptions on File Prior to Visit  Medication Sig Dispense Refill  . amitriptyline (ELAVIL) 50 MG tablet Take 50 mg by mouth at bedtime.      [name] aspirin 81 MG tablet Take 81 mg by mouth daily.      [name] BAYER MICROLET LANCETS lancets use as directed three times a day  100 each  12  . buPROPion (WELLBUTRIN XL) 150 MG 24 hr tablet take 1 tablet by mouth once daily  30 tablet  6  . cholecalciferol (VITAMIN D) 1000 UNITS tablet Take 1,000 Units by mouth daily.        . Cinnamon 500 MG capsule Take 1,000 mg by mouth daily.      . clopidogrel (PLAVIX) 75 MG tablet Take 1 tablet (75 mg  total) by mouth daily.  90 tablet  4  . cyclobenzaprine (FLEXERIL) 10 MG tablet Take 10 mg by mouth 2 (two) times daily as needed.       . diazepam (VALIUM) 2 MG tablet take 1 tablet by mouth three times a day  90 tablet  3  . diazepam (VALIUM) 5 MG tablet Take 1 tablet (5 mg total) by mouth at bedtime as needed for anxiety.  30 tablet  4  . gentamicin ointment (GARAMYCIN) 0.1 % Apply 1 application topically 3 (three) times daily.  15 g  0  . Glucosamine-Chondroit-Vit C-Mn (GLUCOSAMINE CHONDR 500 COMPLEX) CAPS Take 1,000 each by mouth daily.      [name] glucose blood (BAYER CONTOUR TEST) test strip Use to test blood sugar three times daily as instructed.  Dx code: 250.70  100 each  5  . HYDROcodone-acetaminophen (NORCO/VICODIN) 5-325 MG per tablet Take 1-2 tablets by mouth every 6 (six) hours as needed for moderate pain or severe pain.  90 tablet  0  . insulin NPH Human (HUMULIN N,NOVOLIN N) 100 UNIT/ML injection Inject under skin 45 units daily: in am and before dinner  30 mL  11  . Insulin Pen Needle (NOVOFINE) 32G X 6 MM MISC Use three times daily  100 each  5  . insulin regular (NOVOLIN R,HUMULIN R) 100 units/mL injection Inject 0.15-0.2 mLs (15-20 Units  total) into the skin 3 (three) times daily before meals.  30 mL  11  . Insulin Syringe-Needle U-100 (RELION INSULIN SYRINGE 1ML/31G) 31G X 5/16" 1 ML MISC Use 3x a day  200 each  11  . Lactulose 20 GM/30ML SOLN Take 30 mLs by mouth every 2 (two) hours as needed.      Elmore Guise Device MISC 1 application by Does not apply route as needed.  100 each  11  . losartan (COZAAR) 25 MG tablet Take 1 tablet (25 mg total) by mouth daily.  90 tablet  3  . magnesium gluconate (MAGONATE) 500 MG tablet Take 1,000 mg by mouth daily.      . metoprolol succinate (TOPROL-XL) 50 MG 24 hr tablet take 1 tablet by mouth twice a day  60 tablet  5  . Multiple Vitamin (MULTIVITAMIN) capsule Take 1 capsule by mouth daily.        . nitazoxanide (ALINIA) 500 MG tablet Take 1  tablet (500 mg total) by mouth 2 (two) times daily with a meal.  20 tablet  0  . nitroGLYCERIN (NITROSTAT) 0.4 MG SL tablet Place 1 tablet (0.4 mg total) under the tongue every 5 (five) minutes as needed.  25 tablet  1  . omeprazole (PRILOSEC) 20 MG capsule Take 1 capsule (20 mg total) by mouth 2 (two) times daily.  20 capsule  0  . polyethylene glycol (MIRALAX) packet Take 17 g by mouth daily.  14 each  3  . sertraline (ZOLOFT) 50 MG tablet Take 1 tablet (50 mg total) by mouth daily.  30 tablet  4  . simvastatin (ZOCOR) 20 MG tablet Take 1 tablet (20 mg total) by mouth daily at 6 PM.  30 tablet  6  . tamsulosin (FLOMAX) 0.4 MG CAPS capsule take 1 capsule by mouth every morning  30 capsule  5   No current facility-administered medications on file prior to visit.     Past Medical History  Diagnosis Date  . Peripheral vascular disease   . Diabetes mellitus   . Mild aortic stenosis   . Tobacco use disorder     recently quit  . Depression   . Psoriasis   . Neuropathy   . Obesity   . Post splenectomy syndrome   . SOB (shortness of breath)   . Carotid artery occlusion   . Coronary artery disease   . Hyperlipidemia   . Hypertension   . Bell's palsy 2007  . Sleep apnea   . Angina at rest     chronic  . Abnormal colonoscopy 06/2011    Dr. Minna Merritts, polyp  . Cataract     Dr. Dawna Part  . Anxiety   . Arthritis   . DVT (deep venous thrombosis)     in leg  . Stroke   . Allergy   . Heart murmur   . Tubular adenoma 08/2013    Dr. Hilarie Fredrickson     Past Surgical History  Procedure Laterality Date  . Splenectomy    . Heart stents  Jan 2011    leg stents 06/2009 and 03/2010  . Coronary artery bypass graft  09/06/2010    At [facility]: LIMA to LAD, left radial to RCA, sequential SVG to OM3 and 4  . Cardiac catheterization  08/2010    LAD: 80% ISR, RCA: 80% ostial, OM 80-90%  . Coronary angioplasty      LAD: before CABG  . Carotid endarterectomy  08/2010    left/ Dr. Kellie Simmering  .  Pta of illiac  and sfa  multiple    Dr. Ronalee Belts, s/p revision 11/2012  . Colonoscopy       Family History  Problem Relation Age of Onset  . Lung cancer Father 82  . Breast cancer Sister 89  . Diabetes Paternal Grandfather   . Colon polyps Paternal Grandfather 20  . Dementia Mother   . Alzheimer's disease Mother      History   Social History  . Marital Status: Married    Spouse Name: N/A    Number of Children: 0  . Years of Education: N/A   Occupational History  .     Social History Main Topics  . Smoking status: Current Some Day Smoker -- 0.30 packs/day for 40 years    Types: Cigarettes  . Smokeless tobacco: Never Used     Comment: smokes 2-3 cigarettes every 3 weeks  . Alcohol Use: No  . Drug Use: No  . Sexual Activity: Not on file   Other Topics Concern  . Not on file   Social History Narrative  . No narrative on file     PHYSICAL EXAM   BP 118/72  Pulse 72  Ht 5\' 5"  (1.651 m)  Wt 276 lb 8 oz (125.42 kg)  BMI 46.01 kg/m2 Constitutional: He is oriented to person, place, and time. He appears well-developed and well-nourished. No distress.  HENT: No nasal discharge.  Head: Normocephalic and atraumatic.  Eyes: Pupils are equal and round. Right eye exhibits no discharge. Left eye exhibits no discharge.  Neck: Normal range of motion. Neck supple. No JVD present. No thyromegaly present.  Cardiovascular: Normal rate, regular rhythm, diminished S2. Exam reveals no gallop and no friction rub. There is a 2/6 systolic ejection murmur at the aortic area. Pulmonary/Chest: Effort normal and breath sounds normal. No stridor. No respiratory distress. He has no wheezes. He has no rales. He exhibits no tenderness.  Abdominal: Soft. Bowel sounds are normal. He exhibits no distension. There is no tenderness. There is no rebound and no guarding.  Musculoskeletal: Normal range of motion. He exhibits trace edema and no tenderness.  Neurological: He is alert and oriented to person, place, and  time. Coordination normal.  Skin: Skin is warm and dry. There is stasis dermatitis in the lower extremities. He is not diaphoretic. No erythema. No pallor.  Psychiatric: He has a normal mood and affect. His behavior is normal. Judgment and thought content normal.       ASSESSMENT AND PLAN

## 2014-03-25 NOTE — Assessment & Plan Note (Addendum)
He had recent dizziness of unclear etiology. Most recent echocardiogram was 2 years ago. I requested an echocardiogram to ensure stability of  aortic stenosis.

## 2014-03-25 NOTE — Assessment & Plan Note (Signed)
Today's blood pressure is elevated but that's, unusual for him. Continue to monitor.

## 2014-03-25 NOTE — Assessment & Plan Note (Signed)
He has no symptoms of angina. Continue medical therapy. 

## 2014-03-25 NOTE — Assessment & Plan Note (Signed)
Lab Results  Component Value Date   CHOL 98 03/02/2014   HDL 23.30* 03/02/2014   LDLCALC 39 03/02/2014   LDLDIRECT 66.9 05/18/2013   TRIG 177.0* 03/02/2014   CHOLHDL 4 03/02/2014   continue treatment with simvastatin.

## 2014-03-25 NOTE — Patient Instructions (Signed)

## 2014-03-26 ENCOUNTER — Other Ambulatory Visit: Payer: Self-pay

## 2014-03-26 ENCOUNTER — Other Ambulatory Visit (INDEPENDENT_AMBULATORY_CARE_PROVIDER_SITE_OTHER): Payer: Medicare Other

## 2014-03-26 DIAGNOSIS — I359 Nonrheumatic aortic valve disorder, unspecified: Secondary | ICD-10-CM

## 2014-03-26 DIAGNOSIS — I2510 Atherosclerotic heart disease of native coronary artery without angina pectoris: Secondary | ICD-10-CM

## 2014-03-31 ENCOUNTER — Ambulatory Visit (INDEPENDENT_AMBULATORY_CARE_PROVIDER_SITE_OTHER): Payer: Medicare Other | Admitting: Psychology

## 2014-03-31 DIAGNOSIS — F331 Major depressive disorder, recurrent, moderate: Secondary | ICD-10-CM | POA: Diagnosis not present

## 2014-03-31 DIAGNOSIS — F449 Dissociative and conversion disorder, unspecified: Secondary | ICD-10-CM | POA: Diagnosis not present

## 2014-03-31 DIAGNOSIS — M2240 Chondromalacia patellae, unspecified knee: Secondary | ICD-10-CM | POA: Diagnosis not present

## 2014-03-31 DIAGNOSIS — M1710 Unilateral primary osteoarthritis, unspecified knee: Secondary | ICD-10-CM | POA: Diagnosis not present

## 2014-04-07 ENCOUNTER — Ambulatory Visit (INDEPENDENT_AMBULATORY_CARE_PROVIDER_SITE_OTHER): Payer: Medicare Other | Admitting: Psychology

## 2014-04-07 DIAGNOSIS — F331 Major depressive disorder, recurrent, moderate: Secondary | ICD-10-CM | POA: Diagnosis not present

## 2014-04-07 DIAGNOSIS — F449 Dissociative and conversion disorder, unspecified: Secondary | ICD-10-CM

## 2014-04-13 DIAGNOSIS — I6529 Occlusion and stenosis of unspecified carotid artery: Secondary | ICD-10-CM | POA: Diagnosis not present

## 2014-04-13 DIAGNOSIS — I70219 Atherosclerosis of native arteries of extremities with intermittent claudication, unspecified extremity: Secondary | ICD-10-CM | POA: Diagnosis not present

## 2014-04-14 ENCOUNTER — Ambulatory Visit: Payer: Medicare Other | Admitting: Psychology

## 2014-04-15 DIAGNOSIS — I6529 Occlusion and stenosis of unspecified carotid artery: Secondary | ICD-10-CM | POA: Diagnosis not present

## 2014-04-15 DIAGNOSIS — I70219 Atherosclerosis of native arteries of extremities with intermittent claudication, unspecified extremity: Secondary | ICD-10-CM | POA: Diagnosis not present

## 2014-04-15 DIAGNOSIS — N183 Chronic kidney disease, stage 3 unspecified: Secondary | ICD-10-CM | POA: Diagnosis not present

## 2014-04-15 DIAGNOSIS — I10 Essential (primary) hypertension: Secondary | ICD-10-CM | POA: Diagnosis not present

## 2014-04-15 DIAGNOSIS — I2510 Atherosclerotic heart disease of native coronary artery without angina pectoris: Secondary | ICD-10-CM | POA: Diagnosis not present

## 2014-04-16 ENCOUNTER — Ambulatory Visit (INDEPENDENT_AMBULATORY_CARE_PROVIDER_SITE_OTHER): Payer: Medicare Other | Admitting: Internal Medicine

## 2014-04-16 ENCOUNTER — Encounter: Payer: Self-pay | Admitting: Internal Medicine

## 2014-04-16 VITALS — BP 135/70 | HR 80 | Temp 97.9°F | Ht 67.0 in | Wt 279.0 lb

## 2014-04-16 DIAGNOSIS — I2510 Atherosclerotic heart disease of native coronary artery without angina pectoris: Secondary | ICD-10-CM | POA: Diagnosis not present

## 2014-04-16 DIAGNOSIS — E1159 Type 2 diabetes mellitus with other circulatory complications: Secondary | ICD-10-CM | POA: Diagnosis not present

## 2014-04-16 DIAGNOSIS — I798 Other disorders of arteries, arterioles and capillaries in diseases classified elsewhere: Secondary | ICD-10-CM | POA: Diagnosis not present

## 2014-04-16 DIAGNOSIS — E1129 Type 2 diabetes mellitus with other diabetic kidney complication: Secondary | ICD-10-CM | POA: Diagnosis not present

## 2014-04-16 DIAGNOSIS — I129 Hypertensive chronic kidney disease with stage 1 through stage 4 chronic kidney disease, or unspecified chronic kidney disease: Secondary | ICD-10-CM | POA: Diagnosis not present

## 2014-04-16 DIAGNOSIS — N183 Chronic kidney disease, stage 3 unspecified: Secondary | ICD-10-CM | POA: Diagnosis not present

## 2014-04-16 MED ORDER — INSULIN REGULAR HUMAN 100 UNIT/ML IJ SOLN: INTRAMUSCULAR | Status: DC

## 2014-04-16 MED ORDER — INSULIN NPH (HUMAN) (ISOPHANE) 100 UNIT/ML ~~LOC~~ SUSP: SUBCUTANEOUS | Status: DC

## 2014-04-16 NOTE — Progress Notes (Signed)
Patient ID: [name] [name], male   DOB: [birth year], 58 y.o.   MRN: [id number]  HPI: [name] [name] is a 58 y.o.-year-old male, returning for f/u for DM2, dx 2006, insulin-dependent since 2011, uncontrolled, with complications (CAD - CABG 2012, PVD - s/p Stents, Aortic and carotid stenosis, peripheral neuropathy). Last visit 1.5 mo ago.  He has pain in L leg >> surgery for stents on 04/27/2014.  Last hemoglobin A1c: Lab Results  Component Value Date   HGBA1C 8.3* 03/02/2014   HGBA1C 9.5* 11/23/2013   HGBA1C 9.4* 09/16/2013   Pt was on a regimen of: - Lantus 60 units bid >> 60 units in HS >> 80 units in HS - Novolog 14 >> 17 >> 25-25-30 We stopped Victoza at a previous visit. He could not afford his insulins (doughnut hole) - 400$.   He is now on: Insulin Before breakfast Before lunch Before dinner  Regular (short acting) 35 20 35  NPH (long acting) 25  20   Pt checks his sugars 3x a day and they are (forgot log) higher: - am: (120) 192-386 >> 150-200 >> 190-510 (200-300s) >> 132-357 (most <280) >> 125-378 (457) >> 150-180 >> 86, 133-289 - 2h after b'fast: 150-300 >> cannot remember >> 179-449 >> 226-271 (348) >> 198, 239 >> n/c >> 363, 377 - before lunch: n/c >> 269-325 >> 53 x1 >> 70, 102 >> 80 (after cutting grass that am)-120 >> 108, 145-204, 274 - 2h after lunch: n/c >> 211-462 >> 121, 174 >> 84-300 (332 x1) >> n/c >> 140-233, 258 - before dinner: >200 >> 257-323 >> 86-231 (282) >> 53 x1, 122-352 >> 120-150 >> 152-188 - 2h after dinner: n/c >> 268-290 >> 173, 223, 234 >> n/c >> 166-200, 255 - bedtime: 280->400 >> 210-220 >> 242, 319 >> 265 >> n/c >> 127-135 >> 101-276 No lows. Lowest sugar was 86; ? At what level he has hypoglycemia awareness. Highest sugar was 377.  Pt's meals are: - Breakfast: toast + eggs or cereals + milk - Lunch: soup + 1/2 sandwich - Dinner: meat + sweet potatoes or pasta + salad - Snacks: 3: sugar free foods, fruit He quit drinking sodas, but drinks  tea - artificial sweetener. He also started to drink Almond milk. Exercises by walking 2x a day.  - He has CKD, last BUN/creatinine:  Lab Results  Component Value Date   BUN 22 03/02/2014   CREATININE 1.5 03/02/2014  He is on Cozaar.Sees nephrology. - last set of lipids: Lab Results  Component Value Date   CHOL 98 03/02/2014   HDL 23.30* 03/02/2014   LDLCALC 39 03/02/2014   LDLDIRECT 66.9 05/18/2013   TRIG 177.0* 03/02/2014   CHOLHDL 4 03/02/2014  He  Is on Zocor. - last eye exam was in 04/2013 [facility] - Dr Violet Baldy ? DR. + cataracts. - + numbness and tingling in his feet - Dr Normal Regal, but will go to [facility] - Dr Elvina Mattes. He had a foot exam 01/11/2014.   I reviewed pt's medications, allergies, PMH, social hx, family hx and no changes required, except as mentioned above.  ROS: Constitutional: + weight gain, + fatigue, + feeling hot and cold, no nocturia Eyes: + blurry vision, no xerophthalmia ENT: no sore throat, no nodules palpated in throat, + dysphagia/no odynophagia,+  hoarseness, + decreased hearing Cardiovascular: no CP/SOB/no palpitations/leg swelling Respiratory: no cough/SOB Gastrointestinal: no N/V/D/+ C Musculoskeletal: no muscle aches/+ bone pain  Skin: no rash,+ hair loss  Neurological: no tremors/no numbness/tingling/dizziness + diff with erections, low libido  PE: BP 135/70  Pulse 80  Temp(Src) 97.9 F (36.6 C) (Oral)  Ht 5\' 7"  (1.702 m)  Wt 279 lb (126.554 kg)  BMI 43.69 kg/m2  SpO2 94% Wt Readings from Last 3 Encounters:  04/16/14 279 lb (126.554 kg)  03/25/14 276 lb 8 oz (125.42 kg)  03/19/14 277 lb 8 oz (125.873 kg)   Constitutional: obese, in NAD, ruddy complexion Eyes: PERRLA, EOMI, no exophthalmos ENT: dry mucous membranes, no thyromegaly, no cervical lymphadenopathy Cardiovascular: RRR, +2/6 SEM, no RG Respiratory: CTA B Gastrointestinal: abdomen soft, NT, ND, BS+ Musculoskeletal: no deformities, strength intact in  all 4 Skin: moist, warm, + rash - rosacea on face  Neurological: no tremor with outstretched hands, DTR normal in all 4  ASSESSMENT: 1. DM2, insulin-dependent, uncontrolled, with complications - CAD - s/p CABG 2012 - PVD - s/p stents 2012 - Aortic stenosis - carotid stenosis - CKD - peripheral neuropathy   PLAN:  1. Patient with long-standing, uncontrolled diabetes, on injectable regimen - his sugars are worse after he started to eat more 2/2 pain in L leg  Patient Instructions   Please change the insulin doses as follows:   Insulin Before breakfast Before lunch Before dinner  Regular (short acting) 45 25 40  NPH (long acting) 35  25   In the day before surgery:  Insulin Before breakfast Before lunch Before dinner  Regular (short acting) 45 25 25  NPH (long acting) 35  25   In the day of the surgery:  Insulin Before breakfast Before lunch Before dinner  Regular (short acting)   35  NPH (long acting) 20  25   Please return in 1.5 months with your sugar log.   - continue to check 3 times a day, rotating checks - up to date with yearly eye exams >> he is UTD - Return to clinic in 1.5 mo with sugar log

## 2014-04-16 NOTE — Progress Notes (Signed)
Pre visit review using our clinic review tool, if applicable. No additional management support is needed unless otherwise documented below in the visit note. 

## 2014-04-16 NOTE — Patient Instructions (Addendum)
Please change the insulin doses as follows:   Insulin Before breakfast Before lunch Before dinner  Regular (short acting) 45 25 40  NPH (long acting) 35  25   In the day before surgery:  Insulin Before breakfast Before lunch Before dinner  Regular (short acting) 45 25 25  NPH (long acting) 35  25   In the day of the surgery:  Insulin Before breakfast Before lunch Before dinner  Regular (short acting)   35  NPH (long acting) 20  25   Please return in 1.5 months with your sugar log.

## 2014-04-19 DIAGNOSIS — E1159 Type 2 diabetes mellitus with other circulatory complications: Secondary | ICD-10-CM | POA: Diagnosis not present

## 2014-04-19 DIAGNOSIS — I798 Other disorders of arteries, arterioles and capillaries in diseases classified elsewhere: Secondary | ICD-10-CM | POA: Diagnosis not present

## 2014-04-19 DIAGNOSIS — B351 Tinea unguium: Secondary | ICD-10-CM | POA: Diagnosis not present

## 2014-04-19 DIAGNOSIS — L851 Acquired keratosis [keratoderma] palmaris et plantaris: Secondary | ICD-10-CM | POA: Diagnosis not present

## 2014-04-20 DIAGNOSIS — I129 Hypertensive chronic kidney disease with stage 1 through stage 4 chronic kidney disease, or unspecified chronic kidney disease: Secondary | ICD-10-CM | POA: Diagnosis not present

## 2014-04-20 DIAGNOSIS — E1129 Type 2 diabetes mellitus with other diabetic kidney complication: Secondary | ICD-10-CM | POA: Diagnosis not present

## 2014-04-20 DIAGNOSIS — N183 Chronic kidney disease, stage 3 unspecified: Secondary | ICD-10-CM | POA: Diagnosis not present

## 2014-04-20 DIAGNOSIS — E1165 Type 2 diabetes mellitus with hyperglycemia: Secondary | ICD-10-CM | POA: Diagnosis not present

## 2014-04-20 DIAGNOSIS — D631 Anemia in chronic kidney disease: Secondary | ICD-10-CM | POA: Diagnosis not present

## 2014-04-20 DIAGNOSIS — N2581 Secondary hyperparathyroidism of renal origin: Secondary | ICD-10-CM | POA: Diagnosis not present

## 2014-04-21 ENCOUNTER — Ambulatory Visit (INDEPENDENT_AMBULATORY_CARE_PROVIDER_SITE_OTHER): Payer: Medicare Other | Admitting: Psychology

## 2014-04-21 DIAGNOSIS — F449 Dissociative and conversion disorder, unspecified: Secondary | ICD-10-CM

## 2014-04-21 DIAGNOSIS — F331 Major depressive disorder, recurrent, moderate: Secondary | ICD-10-CM | POA: Diagnosis not present

## 2014-04-22 ENCOUNTER — Ambulatory Visit: Payer: Medicare Other | Admitting: Internal Medicine

## 2014-04-22 ENCOUNTER — Telehealth: Payer: Self-pay

## 2014-04-22 NOTE — Telephone Encounter (Signed)
Pt says his fall caused him to hurt his leg, causing a lot of bleeding and feels like it needs to be looked at.  Scheduled pt in tomorrow, advised pt that his leg would be the only thing to be addressed due to the time slot.

## 2014-04-22 NOTE — Telephone Encounter (Signed)
Can you touch base with him and see if he needs anything?

## 2014-04-22 NOTE — Telephone Encounter (Signed)
The patient called and wanted to relay some information to Dr.Walker.  He had to cancel his appointment due to a "real bad" fall this morning.  He also wanted to let the office know that Dr.Schnier (spelling?) is going to his stent(s) in his left leg on Tuesday morning.

## 2014-04-23 ENCOUNTER — Ambulatory Visit (INDEPENDENT_AMBULATORY_CARE_PROVIDER_SITE_OTHER): Payer: Medicare Other | Admitting: Internal Medicine

## 2014-04-23 ENCOUNTER — Encounter: Payer: Self-pay | Admitting: Internal Medicine

## 2014-04-23 VITALS — BP 122/72 | HR 81 | Temp 97.9°F | Ht 67.0 in | Wt 282.5 lb

## 2014-04-23 DIAGNOSIS — I2510 Atherosclerotic heart disease of native coronary artery without angina pectoris: Secondary | ICD-10-CM

## 2014-04-23 DIAGNOSIS — Y92099 Unspecified place in other non-institutional residence as the place of occurrence of the external cause: Secondary | ICD-10-CM

## 2014-04-23 DIAGNOSIS — W19XXXA Unspecified fall, initial encounter: Secondary | ICD-10-CM

## 2014-04-23 DIAGNOSIS — Y92009 Unspecified place in unspecified non-institutional (private) residence as the place of occurrence of the external cause: Secondary | ICD-10-CM | POA: Insufficient documentation

## 2014-04-23 DIAGNOSIS — I739 Peripheral vascular disease, unspecified: Secondary | ICD-10-CM | POA: Diagnosis not present

## 2014-04-23 DIAGNOSIS — R413 Other amnesia: Secondary | ICD-10-CM | POA: Insufficient documentation

## 2014-04-23 DIAGNOSIS — R42 Dizziness and giddiness: Secondary | ICD-10-CM | POA: Diagnosis not present

## 2014-04-23 LAB — TSH: TSH: 1.48 u[IU]/mL (ref 0.35–4.50)

## 2014-04-23 LAB — VITAMIN B12: Vitamin B-12: 535 pg/mL (ref 211–911)

## 2014-04-23 NOTE — Patient Instructions (Signed)
Continue current medications  Follow up in 1 month

## 2014-04-23 NOTE — Assessment & Plan Note (Signed)
Recent light-headedness. Cardiac evaluation with ECHO was stable. Carotid dopplers reportedly normal. Recent labs including CBC and CMP relatively stable. No recent hypoglycemia. Will check CBC anc B12 with labs.

## 2014-04-23 NOTE — Assessment & Plan Note (Signed)
Notes some recent short term memory loss. We discussed possible referral for detailed cognitive testing, however he would like to hold off for now. Will check TSH and B12 with labs.

## 2014-04-23 NOTE — Assessment & Plan Note (Signed)
Scheduled for stent placement left leg next week. Will request notes from [facility].

## 2014-04-23 NOTE — Assessment & Plan Note (Signed)
Recent fall at home after tripping on stair. Encouraged falls prevention, use of cane. Small abrasion on right lower leg healing well.

## 2014-04-23 NOTE — Progress Notes (Signed)
Pre visit review using our clinic review tool, if applicable. No additional management support is needed unless otherwise documented below in the visit note. 

## 2014-04-23 NOTE — Progress Notes (Signed)
Subjective:    Patient ID: [name] [name], male    DOB: 05-Nov-1955, 58 y.o.   MRN: [id number]  HPI 58YO male presents for acute visit.  Seen by Dr. Delana Meyer yesterday. Scheduled to have stent placed in left leg next Tuesday. Left leg has been painful recently. Feels like needles or cramping. Not taking anything for pain right now.  Fell yesterday when walking in driveway. Tripped on stair going to deck. Hit lower right leg. Did not seek care for this. Small scratch noted but no other injuries. Did not hit head or lose consciousness.  Worried about short term memory loss. Pt notes he is more forgetful over last several months.   Notes worsening dizziness, described as lightheadedness, especially with standing from seated position. Has refrained from driving. Seen by cardiologist for this. Recent ECHO was normal. Recent carotid doppler was normal. Blood sugars have been well controlled. No low sugars <70.  Review of Systems  Constitutional: Negative for fever, chills, activity change, appetite change, fatigue and unexpected weight change.  Eyes: Negative for visual disturbance.  Respiratory: Negative for cough and shortness of breath.   Cardiovascular: Negative for chest pain, palpitations and leg swelling.  Gastrointestinal: Negative for nausea, vomiting, abdominal pain, diarrhea, constipation, blood in stool and abdominal distention.  Genitourinary: Negative for dysuria, urgency and difficulty urinating.  Musculoskeletal: Positive for arthralgias and myalgias. Negative for gait problem.  Skin: Positive for wound (abrasion right anterior lower leg). Negative for color change and rash.  Neurological: Positive for dizziness and light-headedness. Negative for tremors, seizures, syncope, facial asymmetry, speech difficulty, weakness, numbness and headaches.  Hematological: Negative for adenopathy.  Psychiatric/Behavioral: Positive for decreased concentration. Negative for sleep disturbance  and dysphoric mood. The patient is not nervous/anxious.        Objective:    BP 122/72  Pulse 81  Temp(Src) 97.9 F (36.6 C) (Oral)  Ht 5\' 7"  (1.702 m)  Wt 282 lb 8 oz (128.141 kg)  BMI 44.24 kg/m2  SpO2 95% Physical Exam  Constitutional: He is oriented to person, place, and time. He appears well-developed and well-nourished. No distress.  HENT:  Head: Normocephalic and atraumatic.  Right Ear: External ear normal.  Left Ear: External ear normal.  Nose: Nose normal.  Mouth/Throat: Oropharynx is clear and moist. No oropharyngeal exudate.  Eyes: Conjunctivae and EOM are normal. Pupils are equal, round, and reactive to light. Right eye exhibits no discharge. Left eye exhibits no discharge. No scleral icterus.  Neck: Normal range of motion. Neck supple. No tracheal deviation present. No thyromegaly present.  Cardiovascular: Normal rate and regular rhythm.  Exam reveals no gallop and no friction rub.   Murmur heard. Pulmonary/Chest: Effort normal and breath sounds normal. No accessory muscle usage. Not tachypneic. No respiratory distress. He has no decreased breath sounds. He has no wheezes. He has no rhonchi. He has no rales. He exhibits no tenderness.  Musculoskeletal: Normal range of motion. He exhibits no edema.  Lymphadenopathy:    He has no cervical adenopathy.  Neurological: He is alert and oriented to person, place, and time. No cranial nerve deficit. Coordination normal.  Skin: Skin is warm and dry. No rash noted. He is not diaphoretic. No erythema. No pallor.     Psychiatric: He has a normal mood and affect. His behavior is normal. Judgment and thought content normal.          Assessment & Plan:   Problem List Items Addressed This Visit     Unprioritized  Dizziness     Recent light-headedness. Cardiac evaluation with ECHO was stable. Carotid dopplers reportedly normal. Recent labs including CBC and CMP relatively stable. No recent hypoglycemia. Will check CBC anc  B12 with labs.    Relevant Orders      TSH      B12   Fall at home     Recent fall at home after tripping on stair. Encouraged falls prevention, use of cane. Small abrasion on right lower leg healing well.    Memory loss     Notes some recent short term memory loss. We discussed possible referral for detailed cognitive testing, however he would like to hold off for now. Will check TSH and B12 with labs.    PVD (peripheral vascular disease) - Primary     Scheduled for stent placement left leg next week. Will request notes from [facility].        Return in about 4 weeks (around 05/21/2014) for Recheck of Diabetes.

## 2014-04-26 ENCOUNTER — Telehealth: Payer: Self-pay | Admitting: Internal Medicine

## 2014-04-26 NOTE — Telephone Encounter (Signed)
emmi emailed °

## 2014-05-04 ENCOUNTER — Ambulatory Visit: Payer: Self-pay | Admitting: Vascular Surgery

## 2014-05-04 DIAGNOSIS — I70229 Atherosclerosis of native arteries of extremities with rest pain, unspecified extremity: Secondary | ICD-10-CM | POA: Diagnosis not present

## 2014-05-04 DIAGNOSIS — I70212 Atherosclerosis of native arteries of extremities with intermittent claudication, left leg: Secondary | ICD-10-CM | POA: Diagnosis not present

## 2014-05-04 DIAGNOSIS — R252 Cramp and spasm: Secondary | ICD-10-CM | POA: Diagnosis not present

## 2014-05-04 DIAGNOSIS — G589 Mononeuropathy, unspecified: Secondary | ICD-10-CM | POA: Diagnosis not present

## 2014-05-04 DIAGNOSIS — Z880 Allergy status to penicillin: Secondary | ICD-10-CM | POA: Diagnosis not present

## 2014-05-04 DIAGNOSIS — E119 Type 2 diabetes mellitus without complications: Secondary | ICD-10-CM | POA: Diagnosis not present

## 2014-05-04 DIAGNOSIS — I10 Essential (primary) hypertension: Secondary | ICD-10-CM | POA: Diagnosis not present

## 2014-05-04 DIAGNOSIS — I639 Cerebral infarction, unspecified: Secondary | ICD-10-CM | POA: Diagnosis not present

## 2014-05-04 DIAGNOSIS — I739 Peripheral vascular disease, unspecified: Secondary | ICD-10-CM | POA: Diagnosis not present

## 2014-05-04 DIAGNOSIS — M79609 Pain in unspecified limb: Secondary | ICD-10-CM | POA: Diagnosis not present

## 2014-05-04 DIAGNOSIS — E780 Pure hypercholesterolemia: Secondary | ICD-10-CM | POA: Diagnosis not present

## 2014-05-04 DIAGNOSIS — I2510 Atherosclerotic heart disease of native coronary artery without angina pectoris: Secondary | ICD-10-CM | POA: Diagnosis not present

## 2014-05-04 DIAGNOSIS — F17200 Nicotine dependence, unspecified, uncomplicated: Secondary | ICD-10-CM | POA: Diagnosis not present

## 2014-05-04 LAB — BASIC METABOLIC PANEL
Anion Gap: 8 (ref 7–16)
BUN: 24 mg/dL — ABNORMAL HIGH (ref 7–18)
Calcium, Total: 9.3 mg/dL (ref 8.5–10.1)
Chloride: 101 mmol/L (ref 98–107)
Co2: 29 mmol/L (ref 21–32)
Creatinine: 1.78 mg/dL — ABNORMAL HIGH (ref 0.60–1.30)
EGFR (African American): 50 — ABNORMAL LOW
EGFR (Non-African Amer.): 42 — ABNORMAL LOW
Glucose: 195 mg/dL — ABNORMAL HIGH (ref 65–99)
Osmolality: 285 (ref 275–301)
Potassium: 4.1 mmol/L (ref 3.5–5.1)
Sodium: 138 mmol/L (ref 136–145)

## 2014-05-12 ENCOUNTER — Ambulatory Visit: Payer: Medicare Other | Admitting: Psychology

## 2014-05-13 DIAGNOSIS — M2242 Chondromalacia patellae, left knee: Secondary | ICD-10-CM | POA: Diagnosis not present

## 2014-05-13 DIAGNOSIS — M25562 Pain in left knee: Secondary | ICD-10-CM | POA: Diagnosis not present

## 2014-05-13 DIAGNOSIS — M2241 Chondromalacia patellae, right knee: Secondary | ICD-10-CM | POA: Diagnosis not present

## 2014-05-13 DIAGNOSIS — M25561 Pain in right knee: Secondary | ICD-10-CM | POA: Diagnosis not present

## 2014-05-19 ENCOUNTER — Ambulatory Visit (INDEPENDENT_AMBULATORY_CARE_PROVIDER_SITE_OTHER): Payer: Medicare Other | Admitting: Psychology

## 2014-05-19 DIAGNOSIS — F332 Major depressive disorder, recurrent severe without psychotic features: Secondary | ICD-10-CM | POA: Diagnosis not present

## 2014-05-19 DIAGNOSIS — F449 Dissociative and conversion disorder, unspecified: Secondary | ICD-10-CM

## 2014-05-28 ENCOUNTER — Encounter: Payer: Self-pay | Admitting: Internal Medicine

## 2014-05-28 ENCOUNTER — Ambulatory Visit (INDEPENDENT_AMBULATORY_CARE_PROVIDER_SITE_OTHER): Payer: Medicare Other | Admitting: Internal Medicine

## 2014-05-28 VITALS — BP 130/84 | HR 83 | Temp 97.8°F | Wt 272.8 lb

## 2014-05-28 DIAGNOSIS — E1159 Type 2 diabetes mellitus with other circulatory complications: Secondary | ICD-10-CM

## 2014-05-28 DIAGNOSIS — E1151 Type 2 diabetes mellitus with diabetic peripheral angiopathy without gangrene: Secondary | ICD-10-CM | POA: Diagnosis not present

## 2014-05-28 DIAGNOSIS — I2510 Atherosclerotic heart disease of native coronary artery without angina pectoris: Secondary | ICD-10-CM

## 2014-05-28 NOTE — Progress Notes (Signed)
Patient ID: [name] [name], male   DOB: 12-20-1955, 58 y.o.   MRN: [id number]  HPI: [name] [name] is a 58 y.o.-year-old male, returning for f/u for DM2, dx 2006, insulin-dependent since 2011, uncontrolled, with complications (CAD - CABG 2012, PVD - s/p Stents, Aortic and carotid stenosis, peripheral neuropathy). Last visit 1.5 mo ago.  He has pain in L leg >> surgery for PVD stents on 04/27/2014. His sugars improved after the tent.   He lost 10 lbs in last month, mostly through reducing portions.  Last hemoglobin A1c: Lab Results  Component Value Date   HGBA1C 8.3* 03/02/2014   HGBA1C 9.5* 11/23/2013   HGBA1C 9.4* 09/16/2013   Pt was on a regimen of: - Lantus 60 units bid >> 60 units in HS >> 80 units in HS - Novolog 14 >> 17 >> 25-25-30 We stopped Victoza at a previous visit. He could not afford his insulins (doughnut hole) - 400$.   He is now on:  Insulin Before breakfast Before lunch Before dinner  Regular (short acting) 45 25 40  NPH (long acting) 35  25   Pt checks his sugars 3x a day and they are better (reviewed log): - am: (120) 192-386 >> 150-200 >> 190-510 (200-300s) >> 132-357 (most <280) >> 125-378 (457) >> 150-180 >> 86, 133-289 >> 95-180, 190 - 2h after b'fast: 150-300 >> cannot remember >> 179-449 >> 226-271 (348) >> 198, 239 >> n/c >> 363, 377 >> 148 - before lunch: n/c >> 269-325 >> 53 x1 >> 70, 102 >> 80 (after cutting grass that am)-120 >> 108, 145-204, 274 >> 106-207 - 2h after lunch: n/c >> 211-462 >> 121, 174 >> 84-300 (332 x1) >> n/c >> 140-233, 258 >> 104-180 - before dinner: >200 >> 257-323 >> 86-231 (282) >> 53 x1, 122-352 >> 120-150 >> 152-188 >> 95-150 - 2h after dinner: n/c >> 268-290 >> 173, 223, 234 >> n/c >> 166-200, 255 >> 114-187, 272x1 - bedtime: 280->400 >> 210-220 >> 242, 319 >> 265 >> n/c >> 127-135 >> 101-276 >> 225 No lows. Lowest sugar was 95; ? At what level he has hypoglycemia awareness. Highest sugar was 377 >> 272.  Pt's meals  are: - Breakfast: toast + eggs or cereals + milk - Lunch: soup + 1/2 sandwich - Dinner: meat + sweet potatoes or pasta + salad - Snacks: 3: sugar free foods, fruit He quit drinking sodas, but drinks tea - artificial sweetener. He also started to drink Almond milk. Exercises by walking 2x a day.  - He has CKD, last BUN/creatinine:  Lab Results  Component Value Date   BUN 22 03/02/2014   CREATININE 1.5 03/02/2014  He is on Cozaar.Sees nephrology. Has a stone in L kidney, has 3 cysts in R kidney. - last set of lipids: Lab Results  Component Value Date   CHOL 98 03/02/2014   HDL 23.30* 03/02/2014   LDLCALC 39 03/02/2014   LDLDIRECT 66.9 05/18/2013   TRIG 177.0* 03/02/2014   CHOLHDL 4 03/02/2014  He  Is on Zocor. - last eye exam was in 04/2013 [facility] - Dr Violet Baldy ? DR. + cataracts. - + numbness and tingling in his feet - Dr Normal Regal, but will go to [facility]) - Dr Elvina Mattes. He had a foot exam 01/11/2014.   I reviewed pt's medications, allergies, PMH, social hx, family hx and no changes required, except as mentioned above.  ROS: Constitutional: + weight loss, no fatigue, + feeling hot  and cold, no nocturia, + poor sleep Eyes: + blurry vision, no xerophthalmia ENT: + sore throat, no nodules palpated in throat, + dysphagia/no odynophagia,+  hoarseness, + decreased hearing Cardiovascular: no CP/SOB/no palpitations/leg swelling Respiratory: + cough/+ SOB Gastrointestinal: no N/V/D/C Musculoskeletal: + muscle aches/+ bone pain  Skin: no rash Neurological: no tremors/no numbness/tingling/dizziness + diff with erections, low libido  PE: BP 130/84 mmHg  Pulse 83  Temp(Src) 97.8 F (36.6 C) (Oral)  Wt 272 lb 12.8 oz (123.741 kg)  SpO2 95% Wt Readings from Last 3 Encounters:  05/28/14 272 lb 12.8 oz (123.741 kg)  04/23/14 282 lb 8 oz (128.141 kg)  04/16/14 279 lb (126.554 kg)   Constitutional: obese, in NAD, ruddy complexion Eyes: PERRLA, EOMI, no  exophthalmos ENT: dry mucous membranes, no thyromegaly, no cervical lymphadenopathy Cardiovascular: RRR, +2/6 SEM, no RG Respiratory: CTA B Gastrointestinal: abdomen soft, NT, ND, BS+ Musculoskeletal: no deformities, strength intact in all 4 Skin: moist, warm, + rash - rosacea on face  Neurological: no tremor with outstretched hands, DTR normal in all 4  ASSESSMENT: 1. DM2, insulin-dependent, uncontrolled, with complications - CAD - s/p CABG 2012 - PVD - s/p stents 2012 - Aortic stenosis - carotid stenosis - CKD - peripheral neuropathy   PLAN:  1. Patient with long-standing, uncontrolled diabetes, on injectable regimen - his sugars are better (per review of his log) after his stent placement sx and after starting to reduce portions. He still has some highs after dinner and subsequently in am >> increase Regular ins with dinner: Patient Instructions  Please increase: Insulin Before breakfast Before lunch Before dinner  Regular (short acting) 45 25 40 >> 45  NPH (long acting) 35   25    Please stop at the lab. Please return in 1.5 month with your sugar log.  - continue to check 3 times a day, rotating checks - up to date with yearly eye exams  - check HbA1c in few days - congratulated him for his weight loss - discussed at length about nutrition and ways to improve his diet. He also goes to nutrition courses at his local grocery store (apparently held by an RD) - Return to clinic in 1.5 mo with sugar log   - time spent with the patient: 25 min, of which >50% was spent reviewing his log, previous labs and evaluations, counseling him about nutrition and developing a plan to avoid hypo- an hyper-glycemia. He had a number of Qs that I answered.

## 2014-05-28 NOTE — Patient Instructions (Addendum)
Please continue:  Insulin Before breakfast Before lunch Before dinner  Regular (short acting) 45 25 40 >> 45  NPH (long acting) 35   25    Please stop at the lab. Please return in 1.5 month with your sugar log.

## 2014-06-01 ENCOUNTER — Encounter: Payer: Self-pay | Admitting: Internal Medicine

## 2014-06-01 ENCOUNTER — Ambulatory Visit (INDEPENDENT_AMBULATORY_CARE_PROVIDER_SITE_OTHER): Payer: Medicare Other | Admitting: Internal Medicine

## 2014-06-01 ENCOUNTER — Other Ambulatory Visit: Payer: Self-pay | Admitting: Internal Medicine

## 2014-06-01 VITALS — BP 104/72 | HR 81 | Temp 97.9°F | Ht 67.0 in | Wt 271.8 lb

## 2014-06-01 DIAGNOSIS — I10 Essential (primary) hypertension: Secondary | ICD-10-CM

## 2014-06-01 DIAGNOSIS — E1159 Type 2 diabetes mellitus with other circulatory complications: Secondary | ICD-10-CM

## 2014-06-01 DIAGNOSIS — E1151 Type 2 diabetes mellitus with diabetic peripheral angiopathy without gangrene: Secondary | ICD-10-CM

## 2014-06-01 DIAGNOSIS — I2510 Atherosclerotic heart disease of native coronary artery without angina pectoris: Secondary | ICD-10-CM | POA: Diagnosis not present

## 2014-06-01 DIAGNOSIS — L409 Psoriasis, unspecified: Secondary | ICD-10-CM | POA: Diagnosis not present

## 2014-06-01 LAB — POCT URINALYSIS DIPSTICK
Bilirubin, UA: NEGATIVE
Blood, UA: NEGATIVE
Glucose, UA: NEGATIVE
Ketones, UA: NEGATIVE
Leukocytes, UA: NEGATIVE
Nitrite, UA: NEGATIVE
Spec Grav, UA: 1.015
Urobilinogen, UA: 0.2
pH, UA: 6

## 2014-06-01 LAB — COMPREHENSIVE METABOLIC PANEL
ALT: 23 U/L (ref 0–53)
AST: 19 U/L (ref 0–37)
Albumin: 3.1 g/dL — ABNORMAL LOW (ref 3.5–5.2)
Alkaline Phosphatase: 152 U/L — ABNORMAL HIGH (ref 39–117)
BUN: 18 mg/dL (ref 6–23)
CO2: 28 mEq/L (ref 19–32)
Calcium: 9.3 mg/dL (ref 8.4–10.5)
Chloride: 100 mEq/L (ref 96–112)
Creatinine, Ser: 1.6 mg/dL — ABNORMAL HIGH (ref 0.4–1.5)
GFR: 48.03 mL/min — ABNORMAL LOW (ref >60.00)
Glucose, Bld: 129 mg/dL — ABNORMAL HIGH (ref 70–99)
Potassium: 5.3 mEq/L — ABNORMAL HIGH (ref 3.5–5.1)
Sodium: 139 mEq/L (ref 135–145)
Total Bilirubin: 0.5 mg/dL (ref 0.2–1.2)
Total Protein: 7.7 g/dL (ref 6.0–8.3)

## 2014-06-01 LAB — HEMOGLOBIN A1C: Hgb A1c MFr Bld: 7.2 % — ABNORMAL HIGH (ref 4.6–6.5)

## 2014-06-01 LAB — LIPID PANEL
Cholesterol: 126 mg/dL (ref 0–200)
HDL: 21.8 mg/dL — ABNORMAL LOW (ref >39.00)
NonHDL: 104.2
Total CHOL/HDL Ratio: 6
Triglycerides: 215 mg/dL — ABNORMAL HIGH (ref 0.0–149.0)
VLDL: 43 mg/dL — ABNORMAL HIGH (ref 0.0–40.0)

## 2014-06-01 NOTE — Progress Notes (Signed)
Pre visit review using our clinic review tool, if applicable. No additional management support is needed unless otherwise documented below in the visit note. 

## 2014-06-01 NOTE — Addendum Note (Signed)
Addended by: Karlene Einstein D on: 06/01/2014 11:18 AM   Modules accepted: Orders

## 2014-06-01 NOTE — Assessment & Plan Note (Signed)
BP Readings from Last 3 Encounters:  06/01/14 104/72  05/28/14 130/84  04/23/14 122/72   BP well controlled. Renal function with labs. Continue current meds.

## 2014-06-01 NOTE — Assessment & Plan Note (Signed)
Dermatology referral pending. Symptoms poorly controlled.

## 2014-06-01 NOTE — Assessment & Plan Note (Signed)
Wt Readings from Last 3 Encounters:  06/01/14 271 lb 12 oz (123.265 kg)  05/28/14 272 lb 12.8 oz (123.741 kg)  04/23/14 282 lb 8 oz (128.141 kg)   Congratulated pt on weight loss. Continue exercise and healthy diet.

## 2014-06-01 NOTE — Patient Instructions (Signed)
Labs today.   Follow up in 3 months.  

## 2014-06-01 NOTE — Progress Notes (Signed)
Subjective:    Patient ID: [name] [name], male    DOB: 11/11/1955, 58 y.o.   MRN: [id number]  HPI 58YO male presents for follow up.  Seen by Dr. Cruzita Lederer on 11/6, and insulin adjusted.  Recently seen by Dr. Abigail Butts. Had CT which showed cysts on right kidney and stone in left ureter.Debating starting another medication, unsure name.  Has been exercising. Following healthy diet. Had one full week of BG <100.  Wt Readings from Last 3 Encounters:  06/01/14 271 lb 12 oz (123.265 kg)  05/28/14 272 lb 12.8 oz (123.741 kg)  04/23/14 282 lb 8 oz (128.141 kg)   Has been limiting use of Diazepam. Only taking about 2x per week.  Recently had 2 stents placed by Dr. Lucky Cowboy into left leg. Tolerated well.   Review of Systems  Constitutional: Negative for fever, chills, activity change, appetite change, fatigue and unexpected weight change.  Eyes: Negative for visual disturbance.  Respiratory: Negative for cough and shortness of breath.   Cardiovascular: Negative for chest pain, palpitations and leg swelling.  Gastrointestinal: Negative for abdominal pain and abdominal distention.  Genitourinary: Negative for dysuria, urgency and difficulty urinating.  Musculoskeletal: Positive for myalgias, arthralgias and gait problem.  Skin: Negative for color change and rash.  Neurological: Positive for weakness and numbness.  Hematological: Negative for adenopathy.  Psychiatric/Behavioral: Negative for sleep disturbance and dysphoric mood. The patient is not nervous/anxious.        Objective:    BP 104/72 mmHg  Pulse 81  Temp(Src) 97.9 F (36.6 C) (Oral)  Ht 5\' 7"  (1.702 m)  Wt 271 lb 12 oz (123.265 kg)  BMI 42.55 kg/m2  SpO2 94% Physical Exam  Constitutional: He is oriented to person, place, and time. He appears well-developed and well-nourished. No distress.  HENT:  Head: Normocephalic and atraumatic.  Right Ear: External ear normal.  Left Ear: External ear normal.  Nose: Nose normal.    Mouth/Throat: Oropharynx is clear and moist. No oropharyngeal exudate.  Eyes: Conjunctivae and EOM are normal. Pupils are equal, round, and reactive to light. Right eye exhibits no discharge. Left eye exhibits no discharge. No scleral icterus.  Neck: Normal range of motion. Neck supple. No tracheal deviation present. No thyromegaly present.  Cardiovascular: Normal rate, regular rhythm and normal heart sounds.  Exam reveals no gallop and no friction rub.   No murmur heard. Pulmonary/Chest: Effort normal and breath sounds normal. No accessory muscle usage. No tachypnea. No respiratory distress. He has no decreased breath sounds. He has no wheezes. He has no rhonchi. He has no rales. He exhibits no tenderness.  Musculoskeletal: Normal range of motion. He exhibits no edema.  Lymphadenopathy:    He has no cervical adenopathy.  Neurological: He is alert and oriented to person, place, and time. No cranial nerve deficit. Coordination normal.  Skin: Skin is warm and dry. Rash (erythematous scaling rash over arms c/w psoriasis) noted. He is not diaphoretic. No erythema. No pallor.  Psychiatric: He has a normal mood and affect. His behavior is normal. Judgment and thought content normal.          Assessment & Plan:   Problem List Items Addressed This Visit      Unprioritized   Hypertension    BP Readings from Last 3 Encounters:  06/01/14 104/72  05/28/14 130/84  04/23/14 122/72   BP well controlled. Renal function with labs. Continue current meds.    Psoriasis    Dermatology referral pending. Symptoms poorly controlled.  Severe obesity (BMI >= 40)    Wt Readings from Last 3 Encounters:  06/01/14 271 lb 12 oz (123.265 kg)  05/28/14 272 lb 12.8 oz (123.741 kg)  04/23/14 282 lb 8 oz (128.141 kg)   Congratulated pt on weight loss. Continue exercise and healthy diet.    Type 2 diabetes mellitus with vascular disease - Primary    Will check A1c with labs today. Reviewed notes from Dr.  Cruzita Lederer.    Relevant Orders      Comprehensive metabolic panel      Hemoglobin A1c      Lipid panel       Return in about 3 months (around 09/01/2014) for Recheck of Diabetes.

## 2014-06-01 NOTE — Assessment & Plan Note (Signed)
Will check A1c with labs today. Reviewed notes from Dr. Cruzita Lederer.

## 2014-06-02 ENCOUNTER — Ambulatory Visit (INDEPENDENT_AMBULATORY_CARE_PROVIDER_SITE_OTHER): Payer: Medicare Other | Admitting: Psychology

## 2014-06-02 ENCOUNTER — Telehealth: Payer: Self-pay | Admitting: Internal Medicine

## 2014-06-02 ENCOUNTER — Other Ambulatory Visit: Payer: Self-pay | Admitting: *Deleted

## 2014-06-02 DIAGNOSIS — E875 Hyperkalemia: Secondary | ICD-10-CM

## 2014-06-02 DIAGNOSIS — F332 Major depressive disorder, recurrent severe without psychotic features: Secondary | ICD-10-CM | POA: Diagnosis not present

## 2014-06-02 DIAGNOSIS — F449 Dissociative and conversion disorder, unspecified: Secondary | ICD-10-CM | POA: Diagnosis not present

## 2014-06-02 LAB — LDL CHOLESTEROL, DIRECT: Direct LDL: 64.9 mg/dL

## 2014-06-02 NOTE — Telephone Encounter (Signed)
emmi emailed °

## 2014-06-03 ENCOUNTER — Other Ambulatory Visit (INDEPENDENT_AMBULATORY_CARE_PROVIDER_SITE_OTHER): Payer: Medicare Other

## 2014-06-03 DIAGNOSIS — E875 Hyperkalemia: Secondary | ICD-10-CM

## 2014-06-03 LAB — BASIC METABOLIC PANEL
BUN: 22 mg/dL (ref 6–23)
CO2: 25 mEq/L (ref 19–32)
Calcium: 9 mg/dL (ref 8.4–10.5)
Chloride: 105 mEq/L (ref 96–112)
Creatinine, Ser: 1.6 mg/dL — ABNORMAL HIGH (ref 0.4–1.5)
GFR: 45.99 mL/min — ABNORMAL LOW (ref >60.00)
Glucose, Bld: 114 mg/dL — ABNORMAL HIGH (ref 70–99)
Potassium: 4.5 mEq/L (ref 3.5–5.1)
Sodium: 140 mEq/L (ref 135–145)

## 2014-06-04 DIAGNOSIS — I739 Peripheral vascular disease, unspecified: Secondary | ICD-10-CM | POA: Diagnosis not present

## 2014-06-08 DIAGNOSIS — E119 Type 2 diabetes mellitus without complications: Secondary | ICD-10-CM | POA: Diagnosis not present

## 2014-06-08 DIAGNOSIS — I70229 Atherosclerosis of native arteries of extremities with rest pain, unspecified extremity: Secondary | ICD-10-CM | POA: Diagnosis not present

## 2014-06-08 DIAGNOSIS — I10 Essential (primary) hypertension: Secondary | ICD-10-CM | POA: Diagnosis not present

## 2014-06-08 DIAGNOSIS — E785 Hyperlipidemia, unspecified: Secondary | ICD-10-CM | POA: Diagnosis not present

## 2014-06-09 ENCOUNTER — Ambulatory Visit (INDEPENDENT_AMBULATORY_CARE_PROVIDER_SITE_OTHER): Payer: Medicare Other | Admitting: Psychology

## 2014-06-09 DIAGNOSIS — F449 Dissociative and conversion disorder, unspecified: Secondary | ICD-10-CM | POA: Diagnosis not present

## 2014-06-09 DIAGNOSIS — F332 Major depressive disorder, recurrent severe without psychotic features: Secondary | ICD-10-CM | POA: Diagnosis not present

## 2014-06-16 ENCOUNTER — Ambulatory Visit (INDEPENDENT_AMBULATORY_CARE_PROVIDER_SITE_OTHER): Payer: Medicare Other | Admitting: Psychology

## 2014-06-16 DIAGNOSIS — F449 Dissociative and conversion disorder, unspecified: Secondary | ICD-10-CM | POA: Diagnosis not present

## 2014-06-16 DIAGNOSIS — F332 Major depressive disorder, recurrent severe without psychotic features: Secondary | ICD-10-CM | POA: Diagnosis not present

## 2014-06-23 ENCOUNTER — Ambulatory Visit (INDEPENDENT_AMBULATORY_CARE_PROVIDER_SITE_OTHER): Payer: Medicare Other | Admitting: Psychology

## 2014-06-23 DIAGNOSIS — F449 Dissociative and conversion disorder, unspecified: Secondary | ICD-10-CM | POA: Diagnosis not present

## 2014-06-23 DIAGNOSIS — F332 Major depressive disorder, recurrent severe without psychotic features: Secondary | ICD-10-CM

## 2014-06-30 ENCOUNTER — Ambulatory Visit (INDEPENDENT_AMBULATORY_CARE_PROVIDER_SITE_OTHER): Payer: Medicare Other | Admitting: Psychology

## 2014-06-30 DIAGNOSIS — F449 Dissociative and conversion disorder, unspecified: Secondary | ICD-10-CM | POA: Diagnosis not present

## 2014-06-30 DIAGNOSIS — F332 Major depressive disorder, recurrent severe without psychotic features: Secondary | ICD-10-CM

## 2014-07-05 ENCOUNTER — Other Ambulatory Visit: Payer: Self-pay | Admitting: Internal Medicine

## 2014-07-07 ENCOUNTER — Ambulatory Visit (INDEPENDENT_AMBULATORY_CARE_PROVIDER_SITE_OTHER): Payer: Medicare Other | Admitting: Psychology

## 2014-07-07 DIAGNOSIS — F449 Dissociative and conversion disorder, unspecified: Secondary | ICD-10-CM | POA: Diagnosis not present

## 2014-07-07 DIAGNOSIS — F332 Major depressive disorder, recurrent severe without psychotic features: Secondary | ICD-10-CM

## 2014-07-09 ENCOUNTER — Encounter: Payer: Self-pay | Admitting: Internal Medicine

## 2014-07-09 ENCOUNTER — Ambulatory Visit (INDEPENDENT_AMBULATORY_CARE_PROVIDER_SITE_OTHER): Payer: Medicare Other | Admitting: Internal Medicine

## 2014-07-09 ENCOUNTER — Other Ambulatory Visit: Payer: Self-pay | Admitting: *Deleted

## 2014-07-09 VITALS — BP 118/76 | HR 81 | Temp 97.6°F | Resp 14 | Wt 275.0 lb

## 2014-07-09 DIAGNOSIS — E1151 Type 2 diabetes mellitus with diabetic peripheral angiopathy without gangrene: Secondary | ICD-10-CM

## 2014-07-09 DIAGNOSIS — I2510 Atherosclerotic heart disease of native coronary artery without angina pectoris: Secondary | ICD-10-CM | POA: Diagnosis not present

## 2014-07-09 DIAGNOSIS — E1159 Type 2 diabetes mellitus with other circulatory complications: Secondary | ICD-10-CM

## 2014-07-09 MED ORDER — TAMSULOSIN HCL 0.4 MG PO CAPS: ORAL_CAPSULE | ORAL | Status: DC

## 2014-07-09 MED ORDER — INSULIN NPH (HUMAN) (ISOPHANE) 100 UNIT/ML ~~LOC~~ SUSP: SUBCUTANEOUS | Status: DC

## 2014-07-09 NOTE — Progress Notes (Signed)
Patient ID: [name] [name], male   DOB: 1955/09/19, 58 y.o.   MRN: [id number]  HPI: [name] [name] is a 58 y.o.-year-old male, returning for f/u for DM2, dx 2006, insulin-dependent since 2011, uncontrolled, with complications (CAD - CABG 2012, PVD - s/p Stents, Aortic and carotid stenosis, peripheral neuropathy). Last visit 1.5 mo ago.  He was recently dx with RLS.   Last hemoglobin A1c: Lab Results  Component Value Date   HGBA1C 7.2* 06/01/2014   HGBA1C 8.3* 03/02/2014   HGBA1C 9.5* 11/23/2013   Pt was on a regimen of: - Lantus 60 units bid >> 60 units in HS >> 80 units in HS - Novolog 14 >> 17 >> 25-25-30 We stopped Victoza at a previous visit. He could not afford his insulins (doughnut hole) - 400$.   He is now on:  Insulin Before breakfast Before lunch Before dinner  Regular (short acting) 45 25 40 >> 45  NPH (long acting) 35   25    Pt checks his sugars 3x a day and they are still close to goal (reviewed log), but high before lunch and some highs in am: - am: 132-357 (most <280) >> 125-378 (457) >> 150-180 >> 86, 133-289 >> 95-180, 190 >> 76, 98-163, 180, 190 - 2h after b'fast: 179-449 >> 226-271 (348) >> 198, 239 >> n/c >> 363, 377 >> 148 >> 139-148 - before lunch: 108, 145-204, 274 >> 106-207 >> 134-187, 192, 207 - 2h after lunch: n/c >> 211-462 >> 121, 174 >> 84-300 (332 x1) >> n/c >> 140-233, 258 >> 104-180 >> 94, 115-189 - before dinner: >200 >> 257-323 >> 86-231 (282) >> 53 x1, 122-352 >> 120-150 >> 152-188 >> 95-150 >> 87, 95-172, 188 - 2h after dinner: n/c >> 268-290 >> 173, 223, 234 >> n/c >> 166-200, 255 >> 114-187, 272x1 >> 101-136 lately , 175 - bedtime: 280->400 >> 210-220 >> 242, 319 >> 265 >> n/c >> 127-135 >> 101-276 >> 225 >> n/c No lows. Lowest sugar was 49 (flu, could not keep food down); ? At what level he has hypoglycemia awareness. Highest sugar was 377 >> 272 >> 224  Pt's meals are: - Breakfast: toast + eggs or cereals + milk - Lunch: soup + 1/2  sandwich - Dinner: meat + sweet potatoes or pasta + salad - Snacks: 3: sugar free foods, fruit He quit drinking sodas, but drinks tea - artificial sweetener. He also started to drink Almond milk. Exercises by walking 2x a day.  - He has CKD, last BUN/creatinine:  Lab Results  Component Value Date   BUN 22 06/03/2014   CREATININE 1.6* 06/03/2014  He is on Cozaar.Sees nephrology. Has a stone in L kidney, has 3 cysts in R kidney. - last set of lipids: Lab Results  Component Value Date   CHOL 126 06/01/2014   HDL 21.80* 06/01/2014   LDLCALC 39 03/02/2014   LDLDIRECT 64.9 06/01/2014   TRIG 215.0* 06/01/2014   CHOLHDL 6 06/01/2014  He  Is on Zocor. - last eye exam was in 04/2013 [facility] - Dr Violet Baldy ? DR. + cataracts. - + numbness and tingling in his feet - Dr Normal Regal, but will go to [facility]) - Dr Elvina Mattes. He had a foot exam 01/11/2014.   He has pain in L leg >> surgery for PVD stents on 04/27/2014. His sugars improved after the stent.   I reviewed pt's medications, allergies, PMH, social hx, family hx, and changes were documented  in the history of present illness. Otherwise, unchanged from my initial visit note. He stopped taking Diazepam.  ROS: Constitutional: + weight loss (not per our scale), no fatigue, + feeling hot and cold, no nocturia, + poor sleep Eyes: no blurry vision, no xerophthalmia ENT: no sore throat, no nodules palpated in throat, + dysphagia/no odynophagia,no  hoarseness, + decreased hearing Cardiovascular: no CP/SOB/no palpitations/+ leg swelling Respiratory:no cough/+ SOB Gastrointestinal: no N/V/D/C Musculoskeletal: no muscle aches/+ bone pain  Skin: no rash Neurological: no tremors/no numbness/tingling/dizziness + diff with erections, + low libido  PE: BP 118/76 mmHg  Pulse 81  Temp(Src) 97.6 F (36.4 C) (Oral)  Resp 14  Wt 275 lb (124.739 kg)  SpO2 95% Wt Readings from Last 3 Encounters:  07/09/14 275 lb (124.739 kg)   06/01/14 271 lb 12 oz (123.265 kg)  05/28/14 272 lb 12.8 oz (123.741 kg)   Constitutional: obese, in NAD, ruddy complexion Eyes: PERRLA, EOMI, no exophthalmos ENT: dry mucous membranes, no thyromegaly, no cervical lymphadenopathy Cardiovascular: RRR, +2/6 SEM, no RG Respiratory: CTA B Gastrointestinal: abdomen soft, NT, ND, BS+ Musculoskeletal: no deformities, strength intact in all 4 Skin: moist, warm, + rash - rosacea on face  Neurological: no tremor with outstretched hands, DTR normal in all 4  ASSESSMENT: 1. DM2, insulin-dependent, uncontrolled, with complications - CAD - s/p CABG 2012 - PVD - s/p stents 2012 - Aortic stenosis - carotid stenosis - CKD - peripheral neuropathy   PLAN:  1. Patient with long-standing, uncontrolled diabetes, on injectable regimen - his sugars are better (per review of his log and the last HbA1c) after we switched to NPH and Regular insulin regimen, after his stent placement sx, and after starting to reduce portions.  He still has highs before lunch and in am >> increase NPH with b'fast and dinner: Patient Instructions   Pleas increase the insulin as follows:  Insulin Before breakfast Before lunch Before dinner  Regular (short acting) 45 25 45  NPH (long acting) 35 >> 40  25 >> 30   Please come back for a follow-up appointment in 3 months.  - continue to check 3 times a day, rotating checks - needs new eye exam >> advised to schedule - check HbA1c at next visit; reviewed HbA1c obtained by PCP - congratulated him for his improved DM ctrl - Return to clinic in 3 mo with sugar log

## 2014-07-09 NOTE — Patient Instructions (Signed)
Pleas increase the insulin as follows:  Insulin Before breakfast Before lunch Before dinner  Regular (short acting) 45 25 45  NPH (long acting) 35 >> 40  25 >> 30   Please come back for a follow-up appointment in 3 months.

## 2014-07-14 ENCOUNTER — Ambulatory Visit (INDEPENDENT_AMBULATORY_CARE_PROVIDER_SITE_OTHER): Payer: Medicare Other | Admitting: Psychology

## 2014-07-14 DIAGNOSIS — F332 Major depressive disorder, recurrent severe without psychotic features: Secondary | ICD-10-CM

## 2014-07-14 DIAGNOSIS — F449 Dissociative and conversion disorder, unspecified: Secondary | ICD-10-CM | POA: Diagnosis not present

## 2014-07-19 DIAGNOSIS — B351 Tinea unguium: Secondary | ICD-10-CM | POA: Diagnosis not present

## 2014-07-19 DIAGNOSIS — E1142 Type 2 diabetes mellitus with diabetic polyneuropathy: Secondary | ICD-10-CM | POA: Diagnosis not present

## 2014-07-20 DIAGNOSIS — I10 Essential (primary) hypertension: Secondary | ICD-10-CM | POA: Diagnosis not present

## 2014-07-20 DIAGNOSIS — E1122 Type 2 diabetes mellitus with diabetic chronic kidney disease: Secondary | ICD-10-CM | POA: Diagnosis not present

## 2014-07-20 DIAGNOSIS — D631 Anemia in chronic kidney disease: Secondary | ICD-10-CM | POA: Diagnosis not present

## 2014-07-20 DIAGNOSIS — N2581 Secondary hyperparathyroidism of renal origin: Secondary | ICD-10-CM | POA: Diagnosis not present

## 2014-07-20 DIAGNOSIS — N183 Chronic kidney disease, stage 3 (moderate): Secondary | ICD-10-CM | POA: Diagnosis not present

## 2014-07-28 ENCOUNTER — Ambulatory Visit (INDEPENDENT_AMBULATORY_CARE_PROVIDER_SITE_OTHER): Payer: Medicare Other | Admitting: Psychology

## 2014-07-28 DIAGNOSIS — F449 Dissociative and conversion disorder, unspecified: Secondary | ICD-10-CM | POA: Diagnosis not present

## 2014-07-28 DIAGNOSIS — F332 Major depressive disorder, recurrent severe without psychotic features: Secondary | ICD-10-CM

## 2014-08-02 DIAGNOSIS — L400 Psoriasis vulgaris: Secondary | ICD-10-CM | POA: Diagnosis not present

## 2014-08-02 DIAGNOSIS — L905 Scar conditions and fibrosis of skin: Secondary | ICD-10-CM | POA: Diagnosis not present

## 2014-08-02 DIAGNOSIS — B359 Dermatophytosis, unspecified: Secondary | ICD-10-CM | POA: Diagnosis not present

## 2014-08-02 DIAGNOSIS — L718 Other rosacea: Secondary | ICD-10-CM | POA: Diagnosis not present

## 2014-08-04 ENCOUNTER — Telehealth: Payer: Self-pay | Admitting: *Deleted

## 2014-08-04 ENCOUNTER — Ambulatory Visit: Payer: Medicare Other | Admitting: Psychology

## 2014-08-04 DIAGNOSIS — G4733 Obstructive sleep apnea (adult) (pediatric): Secondary | ICD-10-CM

## 2014-08-04 NOTE — Telephone Encounter (Signed)
Received request for sleep study results from supply company, [facility].  We do not have a sleep study on file.  Spoke with pt, Pt states that the last sleep study he had was in [place] around 2003.  Will a new sleep study need to be ordered?

## 2014-08-04 NOTE — Telephone Encounter (Signed)
That is fine. Where does he want to have the sleep study?

## 2014-08-05 NOTE — Telephone Encounter (Signed)
Pt states that [facility] will be fine.

## 2014-08-10 DIAGNOSIS — L400 Psoriasis vulgaris: Secondary | ICD-10-CM | POA: Diagnosis not present

## 2014-08-11 ENCOUNTER — Ambulatory Visit (INDEPENDENT_AMBULATORY_CARE_PROVIDER_SITE_OTHER): Payer: Medicare Other | Admitting: Psychology

## 2014-08-11 DIAGNOSIS — F449 Dissociative and conversion disorder, unspecified: Secondary | ICD-10-CM | POA: Diagnosis not present

## 2014-08-11 DIAGNOSIS — F332 Major depressive disorder, recurrent severe without psychotic features: Secondary | ICD-10-CM

## 2014-08-12 DIAGNOSIS — L400 Psoriasis vulgaris: Secondary | ICD-10-CM | POA: Diagnosis not present

## 2014-08-17 ENCOUNTER — Ambulatory Visit: Payer: Self-pay | Admitting: Internal Medicine

## 2014-08-17 DIAGNOSIS — L400 Psoriasis vulgaris: Secondary | ICD-10-CM | POA: Diagnosis not present

## 2014-08-17 DIAGNOSIS — G4730 Sleep apnea, unspecified: Secondary | ICD-10-CM | POA: Diagnosis not present

## 2014-08-17 DIAGNOSIS — G4733 Obstructive sleep apnea (adult) (pediatric): Secondary | ICD-10-CM | POA: Diagnosis not present

## 2014-08-18 ENCOUNTER — Ambulatory Visit: Payer: Medicare Other | Admitting: Psychology

## 2014-08-19 ENCOUNTER — Telehealth: Payer: Self-pay | Admitting: *Deleted

## 2014-08-19 ENCOUNTER — Other Ambulatory Visit: Payer: Self-pay | Admitting: Internal Medicine

## 2014-08-19 NOTE — Telephone Encounter (Signed)
Pt called states he wants his CPap equipment to come from [facility].  [phone] phone.  Pt further states an order form has been faxed to [facility].  pts sleep study was on 1.26.16.

## 2014-08-19 NOTE — Telephone Encounter (Signed)
Ok. Fine to send orders to this agency.

## 2014-08-20 NOTE — Telephone Encounter (Signed)
Received fax from [facility], Form signed and faxed back

## 2014-08-25 ENCOUNTER — Telehealth: Payer: Self-pay | Admitting: *Deleted

## 2014-08-25 ENCOUNTER — Ambulatory Visit: Payer: Medicare Other | Admitting: Psychology

## 2014-08-25 DIAGNOSIS — E119 Type 2 diabetes mellitus without complications: Secondary | ICD-10-CM | POA: Diagnosis not present

## 2014-08-25 LAB — HM DIABETES EYE EXAM

## 2014-08-25 NOTE — Telephone Encounter (Signed)
Jeneen Rinks called states he has faxed over another order for pts CPap Machine.  States the new results from the sleep study should be 14, he needs that pressure indicated and signed by MD.  Also needs the usage notes from the last 6 months, advising that pt has sleep apnea and needs CPap.  Form being faxed now.

## 2014-08-25 NOTE — Telephone Encounter (Signed)
OK. Will sign form and orders once we receive it.

## 2014-08-26 DIAGNOSIS — L400 Psoriasis vulgaris: Secondary | ICD-10-CM | POA: Diagnosis not present

## 2014-08-31 DIAGNOSIS — L400 Psoriasis vulgaris: Secondary | ICD-10-CM | POA: Diagnosis not present

## 2014-09-01 ENCOUNTER — Ambulatory Visit (INDEPENDENT_AMBULATORY_CARE_PROVIDER_SITE_OTHER): Payer: Medicare Other | Admitting: Psychology

## 2014-09-01 DIAGNOSIS — F449 Dissociative and conversion disorder, unspecified: Secondary | ICD-10-CM

## 2014-09-01 DIAGNOSIS — F332 Major depressive disorder, recurrent severe without psychotic features: Secondary | ICD-10-CM | POA: Diagnosis not present

## 2014-09-02 ENCOUNTER — Ambulatory Visit (INDEPENDENT_AMBULATORY_CARE_PROVIDER_SITE_OTHER): Payer: Medicare Other | Admitting: Internal Medicine

## 2014-09-02 ENCOUNTER — Encounter: Payer: Self-pay | Admitting: Internal Medicine

## 2014-09-02 VITALS — BP 116/76 | HR 70 | Temp 97.6°F | Ht 67.0 in | Wt 280.0 lb

## 2014-09-02 DIAGNOSIS — R61 Generalized hyperhidrosis: Secondary | ICD-10-CM

## 2014-09-02 DIAGNOSIS — L400 Psoriasis vulgaris: Secondary | ICD-10-CM | POA: Diagnosis not present

## 2014-09-02 DIAGNOSIS — I10 Essential (primary) hypertension: Secondary | ICD-10-CM | POA: Diagnosis not present

## 2014-09-02 DIAGNOSIS — E1151 Type 2 diabetes mellitus with diabetic peripheral angiopathy without gangrene: Secondary | ICD-10-CM | POA: Diagnosis not present

## 2014-09-02 DIAGNOSIS — F419 Anxiety disorder, unspecified: Secondary | ICD-10-CM | POA: Diagnosis not present

## 2014-09-02 DIAGNOSIS — G4730 Sleep apnea, unspecified: Secondary | ICD-10-CM | POA: Diagnosis not present

## 2014-09-02 DIAGNOSIS — E1159 Type 2 diabetes mellitus with other circulatory complications: Secondary | ICD-10-CM

## 2014-09-02 LAB — MICROALBUMIN / CREATININE URINE RATIO
Creatinine,U: 67.7 mg/dL
Microalb Creat Ratio: 8.9 mg/g (ref 0.0–30.0)
Microalb, Ur: 6 mg/dL — ABNORMAL HIGH (ref 0.0–1.9)

## 2014-09-02 LAB — COMPREHENSIVE METABOLIC PANEL
ALT: 17 U/L (ref 0–53)
AST: 14 U/L (ref 0–37)
Albumin: 3.8 g/dL (ref 3.5–5.2)
Alkaline Phosphatase: 135 U/L — ABNORMAL HIGH (ref 39–117)
BUN: 25 mg/dL — ABNORMAL HIGH (ref 6–23)
CO2: 27 mEq/L (ref 19–32)
Calcium: 8.7 mg/dL (ref 8.4–10.5)
Chloride: 101 mEq/L (ref 96–112)
Creatinine, Ser: 1.79 mg/dL — ABNORMAL HIGH (ref 0.40–1.50)
GFR: 41.25 mL/min — ABNORMAL LOW (ref >60.00)
Glucose, Bld: 174 mg/dL — ABNORMAL HIGH (ref 70–99)
Potassium: 4.4 mEq/L (ref 3.5–5.1)
Sodium: 135 mEq/L (ref 135–145)
Total Bilirubin: 0.6 mg/dL (ref 0.2–1.2)
Total Protein: 7.7 g/dL (ref 6.0–8.3)

## 2014-09-02 LAB — CBC WITH DIFFERENTIAL/PLATELET
Basophils Absolute: 0 10*3/uL (ref 0.0–0.1)
Basophils Relative: 0.4 % (ref 0.0–3.0)
Eosinophils Absolute: 0.3 10*3/uL (ref 0.0–0.7)
Eosinophils Relative: 2.9 % (ref 0.0–5.0)
HCT: 35.9 % — ABNORMAL LOW (ref 39.0–52.0)
Hemoglobin: 12 g/dL — ABNORMAL LOW (ref 13.0–17.0)
Lymphocytes Relative: 19 % (ref 12.0–46.0)
Lymphs Abs: 2.1 10*3/uL (ref 0.7–4.0)
MCHC: 33.5 g/dL (ref 30.0–36.0)
MCV: 84.9 fl (ref 78.0–100.0)
Monocytes Absolute: 0.7 10*3/uL (ref 0.1–1.0)
Monocytes Relative: 6.8 % (ref 3.0–12.0)
Neutro Abs: 7.7 10*3/uL (ref 1.4–7.7)
Neutrophils Relative %: 70.9 % (ref 43.0–77.0)
Platelets: 219 10*3/uL (ref 150.0–400.0)
RBC: 4.22 Mil/uL (ref 4.22–5.81)
RDW: 16.6 % — ABNORMAL HIGH (ref 11.5–15.5)
WBC: 10.8 10*3/uL — ABNORMAL HIGH (ref 4.0–10.5)

## 2014-09-02 LAB — LIPID PANEL
Cholesterol: 98 mg/dL (ref 0–200)
HDL: 27 mg/dL — ABNORMAL LOW (ref >39.00)
LDL Cholesterol: 40 mg/dL (ref 0–99)
NonHDL: 71
Total CHOL/HDL Ratio: 4
Triglycerides: 156 mg/dL — ABNORMAL HIGH (ref 0.0–149.0)
VLDL: 31.2 mg/dL (ref 0.0–40.0)

## 2014-09-02 LAB — HEMOGLOBIN A1C: Hgb A1c MFr Bld: 6.9 % — ABNORMAL HIGH (ref 4.6–6.5)

## 2014-09-02 LAB — TSH: TSH: 2.02 u[IU]/mL (ref 0.35–4.50)

## 2014-09-02 MED ORDER — SERTRALINE HCL 100 MG PO TABS: 100.0000 mg | ORAL_TABLET | Freq: Every day | ORAL | Status: DC

## 2014-09-02 MED ORDER — HYDROCODONE-ACETAMINOPHEN 5-325 MG PO TABS: 1.0000 | ORAL_TABLET | Freq: Four times a day (QID) | ORAL | Status: DC | PRN

## 2014-09-02 MED ORDER — DIAZEPAM 5 MG PO TABS
5.0000 mg | ORAL_TABLET | Freq: Every evening | ORAL | Status: DC | PRN
Start: 2014-09-02 — End: 2015-02-22

## 2014-09-02 NOTE — Progress Notes (Signed)
Subjective:    Patient ID: [name] [name], male    DOB: 13-Sep-1955, 59 y.o.   MRN: [id number]  HPI  59YO male presents for follow up.  DM - Notes some dietary indiscretion. Compliant with medications. Has not been checking BG.  OSA - using CPAP nightly at 18cmH2O. Without CPAP, pt feels short of breath, fatigued and is noted to have loud snoring with apnea by his wife. When using CPAP, he feels more rested and energetic.  Having more severe leg pain, mostly aching in the right knee and muscular pain in left leg. Started on Hydrocodone by vascular with some improvement.  Anxiety/Depression - Symptoms recently worsened with stressors at home. New family members living with them. Would like to increase dose of Sertraline. Stopped using Diazepam 2mg , but continues taking Diazepam 5mg  on occasion for anxiety.  He notes some recent diaphoresis and intolerance to warm temperatures. No dyspnea or chest pain noted.  Past medical, surgical, family and social history per today's encounter.  Review of Systems  Constitutional: Positive for diaphoresis. Negative for fever, chills, activity change, appetite change, fatigue and unexpected weight change.  Eyes: Negative for visual disturbance.  Respiratory: Negative for cough and shortness of breath.   Cardiovascular: Negative for chest pain, palpitations and leg swelling.  Gastrointestinal: Negative for nausea, vomiting, abdominal pain, diarrhea, constipation and abdominal distention.  Genitourinary: Negative for dysuria, urgency and difficulty urinating.  Musculoskeletal: Positive for myalgias and arthralgias. Negative for gait problem.  Skin: Negative for color change and rash.  Hematological: Negative for adenopathy.  Psychiatric/Behavioral: Positive for sleep disturbance and dysphoric mood. Negative for suicidal ideas. The patient is nervous/anxious.   All other systems reviewed and are negative.      Objective:    BP 116/76 mmHg  Pulse  70  Temp(Src) 97.6 F (36.4 C) (Oral)  Ht 5\' 7"  (1.702 m)  Wt 280 lb (127.007 kg)  BMI 43.84 kg/m2  SpO2 94% Physical Exam  Constitutional: He is oriented to person, place, and time. He appears well-developed and well-nourished. No distress.  HENT:  Head: Normocephalic and atraumatic.  Right Ear: External ear normal.  Left Ear: External ear normal.  Nose: Nose normal.  Mouth/Throat: Oropharynx is clear and moist. No oropharyngeal exudate.  Eyes: Conjunctivae and EOM are normal. Pupils are equal, round, and reactive to light. Right eye exhibits no discharge. Left eye exhibits no discharge. No scleral icterus.  Neck: Normal range of motion. Neck supple. No tracheal deviation present. No thyromegaly present.  Cardiovascular: Normal rate, regular rhythm and normal heart sounds.  Exam reveals no gallop and no friction rub.   No murmur heard. Pulmonary/Chest: Effort normal and breath sounds normal. No accessory muscle usage. No tachypnea. No respiratory distress. He has no decreased breath sounds. He has no wheezes. He has no rhonchi. He has no rales. He exhibits no tenderness.  Musculoskeletal: Normal range of motion. He exhibits no edema.  Lymphadenopathy:    He has no cervical adenopathy.  Neurological: He is alert and oriented to person, place, and time. No cranial nerve deficit. Coordination normal.  Skin: Skin is warm and dry. Rash noted. Rash is pustular (over legs and arms c/w known psoriasis). He is not diaphoretic. No erythema. No pallor.  Psychiatric: He has a normal mood and affect. His speech is normal and behavior is normal. Judgment and thought content normal. Cognition and memory are normal.          Assessment & Plan:   Problem List  Items Addressed This Visit      Unprioritized   Anxiety    Symptoms of anxiety recently worsened. Will increase Sertraline to 100mg  daily. Continue prn Diazepam. Follow up in 4 weeks and prn.      Relevant Medications   sertraline  (ZOLOFT) tablet   diazepam (VALIUM) tablet   Diaphoresis    Recent episodes of temp intolerance and diaphoresis. Will check TSH and CBC with labs.      Relevant Orders   TSH   CBC w/Diff   Hypertension    BP Readings from Last 3 Encounters:  09/02/14 116/76  07/09/14 118/76  06/01/14 104/72   BP well controlled. Pt had started back on Losartan. Had hyperkalemia with this in the past. Will check electrolytes and renal function with labs.      Sleep apnea    Symptoms well controlled with CPAP 18cmH2O. Off CPAP he has fatigue, dyspnea, and is noted to have snoring and apnea by his wife. He is compliant with treatment. Will continue.      Type 2 diabetes mellitus with vascular disease - Primary    Will check A1c with labs. Continue current medications.      Relevant Orders   Comprehensive metabolic panel   Hemoglobin A1c   Lipid panel   Microalbumin / creatinine urine ratio       Return in about 4 weeks (around 09/30/2014) for Recheck.

## 2014-09-02 NOTE — Assessment & Plan Note (Signed)
Will check A1c with labs. Continue current medications. 

## 2014-09-02 NOTE — Assessment & Plan Note (Signed)
BP Readings from Last 3 Encounters:  09/02/14 116/76  07/09/14 118/76  06/01/14 104/72   BP well controlled. Pt had started back on Losartan. Had hyperkalemia with this in the past. Will check electrolytes and renal function with labs.

## 2014-09-02 NOTE — Progress Notes (Signed)
Pre visit review using our clinic review tool, if applicable. No additional management support is needed unless otherwise documented below in the visit note. 

## 2014-09-02 NOTE — Assessment & Plan Note (Signed)
Symptoms of anxiety recently worsened. Will increase Sertraline to 100mg  daily. Continue prn Diazepam. Follow up in 4 weeks and prn.

## 2014-09-02 NOTE — Patient Instructions (Signed)
Labs today.  Increase Sertraline to 100mg  daily to help with anxiety.  Follow up in 4 weeks.

## 2014-09-02 NOTE — Assessment & Plan Note (Signed)
Symptoms well controlled with CPAP 18cmH2O. Off CPAP he has fatigue, dyspnea, and is noted to have snoring and apnea by his wife. He is compliant with treatment. Will continue.

## 2014-09-02 NOTE — Assessment & Plan Note (Signed)
Recent episodes of temp intolerance and diaphoresis. Will check TSH and CBC with labs.

## 2014-09-03 ENCOUNTER — Other Ambulatory Visit: Payer: Self-pay | Admitting: *Deleted

## 2014-09-03 DIAGNOSIS — G4733 Obstructive sleep apnea (adult) (pediatric): Secondary | ICD-10-CM

## 2014-09-08 ENCOUNTER — Encounter: Payer: Self-pay | Admitting: Internal Medicine

## 2014-09-08 ENCOUNTER — Ambulatory Visit: Payer: Medicare Other | Admitting: Psychology

## 2014-09-09 DIAGNOSIS — L400 Psoriasis vulgaris: Secondary | ICD-10-CM | POA: Diagnosis not present

## 2014-09-13 DIAGNOSIS — L905 Scar conditions and fibrosis of skin: Secondary | ICD-10-CM | POA: Diagnosis not present

## 2014-09-13 DIAGNOSIS — L400 Psoriasis vulgaris: Secondary | ICD-10-CM | POA: Diagnosis not present

## 2014-09-13 DIAGNOSIS — L910 Hypertrophic scar: Secondary | ICD-10-CM | POA: Diagnosis not present

## 2014-09-13 DIAGNOSIS — R208 Other disturbances of skin sensation: Secondary | ICD-10-CM | POA: Diagnosis not present

## 2014-09-14 DIAGNOSIS — I70213 Atherosclerosis of native arteries of extremities with intermittent claudication, bilateral legs: Secondary | ICD-10-CM | POA: Diagnosis not present

## 2014-09-14 DIAGNOSIS — E1169 Type 2 diabetes mellitus with other specified complication: Secondary | ICD-10-CM | POA: Diagnosis not present

## 2014-09-14 DIAGNOSIS — I739 Peripheral vascular disease, unspecified: Secondary | ICD-10-CM | POA: Diagnosis not present

## 2014-09-14 DIAGNOSIS — I2510 Atherosclerotic heart disease of native coronary artery without angina pectoris: Secondary | ICD-10-CM | POA: Diagnosis not present

## 2014-09-14 DIAGNOSIS — I6529 Occlusion and stenosis of unspecified carotid artery: Secondary | ICD-10-CM | POA: Diagnosis not present

## 2014-09-14 DIAGNOSIS — E785 Hyperlipidemia, unspecified: Secondary | ICD-10-CM | POA: Diagnosis not present

## 2014-09-14 DIAGNOSIS — E669 Obesity, unspecified: Secondary | ICD-10-CM | POA: Diagnosis not present

## 2014-09-15 ENCOUNTER — Ambulatory Visit (INDEPENDENT_AMBULATORY_CARE_PROVIDER_SITE_OTHER): Payer: Medicare Other | Admitting: Psychology

## 2014-09-15 DIAGNOSIS — F332 Major depressive disorder, recurrent severe without psychotic features: Secondary | ICD-10-CM

## 2014-09-15 DIAGNOSIS — F449 Dissociative and conversion disorder, unspecified: Secondary | ICD-10-CM | POA: Diagnosis not present

## 2014-09-17 ENCOUNTER — Ambulatory Visit (INDEPENDENT_AMBULATORY_CARE_PROVIDER_SITE_OTHER): Payer: Medicare Other | Admitting: Cardiovascular Disease

## 2014-09-17 ENCOUNTER — Encounter: Payer: Self-pay | Admitting: Cardiovascular Disease

## 2014-09-17 VITALS — BP 142/70 | HR 84 | Ht 66.0 in | Wt 275.8 lb

## 2014-09-17 DIAGNOSIS — G4730 Sleep apnea, unspecified: Secondary | ICD-10-CM | POA: Diagnosis not present

## 2014-09-17 DIAGNOSIS — E1151 Type 2 diabetes mellitus with diabetic peripheral angiopathy without gangrene: Secondary | ICD-10-CM

## 2014-09-17 DIAGNOSIS — I25119 Atherosclerotic heart disease of native coronary artery with unspecified angina pectoris: Secondary | ICD-10-CM

## 2014-09-17 DIAGNOSIS — E785 Hyperlipidemia, unspecified: Secondary | ICD-10-CM

## 2014-09-17 DIAGNOSIS — I350 Nonrheumatic aortic (valve) stenosis: Secondary | ICD-10-CM | POA: Diagnosis not present

## 2014-09-17 DIAGNOSIS — E1159 Type 2 diabetes mellitus with other circulatory complications: Secondary | ICD-10-CM

## 2014-09-17 DIAGNOSIS — I739 Peripheral vascular disease, unspecified: Secondary | ICD-10-CM

## 2014-09-17 MED ORDER — ALBUTEROL SULFATE HFA 108 (90 BASE) MCG/ACT IN AERS: 2.0000 | INHALATION_SPRAY | RESPIRATORY_TRACT | Status: DC | PRN

## 2014-09-17 NOTE — Progress Notes (Signed)
Patient ID: [name] [name], male    DOB: [birth year], 59 y.o.   MRN: [id number]  HPI Comments: Mr. Shank is a 59 year old male with a long history of smoking for 50 years, who continues to smoke, history of coronary artery disease status post drug-eluting stent placements to the LAD in 2011. Repeat cardiac catheterization in 2012 showed significant restenosis with progression of three-vessel disease. He underwent coronary artery bypass graft surgery at that time. He also had left carotid endarterectomy at the same time.  Also history of  type 2 diabetes, hypertension, hyperlipidemia, mild aortic stenosis and peripheral arterial disease which is being followed by Dr. Ronalee Belts. He presents for follow-up of his coronary artery disease  In follow-up today, he reports that he has a new CPAP machine with different pressures. He has only been on this for a day or 2. So far sleeping much better. At baseline has chronic shortness of breath and he wonders if his new machine will help his breathing He drinks significant fluids during the daytime. No regular exercise program. Limited by arthritis. Reports his nose is very dry to the winter Continues to have neuropathy in his legs, chronic issue Denies any recent episodes of dizziness, no chest pain symptoms  Recent lab work reviewed with him showing total cholesterol 98, hemoglobin A1c 6.9 Creatinine 1.79, BUN 25 in 09/02/2014  EKG on today's visit shows normal sinus rhythm with no significant ST or T-wave changes  Other past medical history Most recent stress test was done in July of 2013 which showed no evidence of ischemia. Echocardiogram in July 2013 showed normal LV systolic function with mild aortic stenosis.      Allergies  Allergen Reactions  . Glipizide Other (See Comments)    ANTIDIABETICS. Burning  . Metrizamide     Got very hot and red  . Penicillins Hives and Swelling  . Contrast Media [Iodinated Diagnostic Agents] Rash    Got  very hot and red    Outpatient Encounter Prescriptions as of 09/17/2014  Medication Sig  . albuterol (PROVENTIL HFA;VENTOLIN HFA) 108 (90 BASE) MCG/ACT inhaler Inhale 2 puffs into the lungs every 4 (four) hours as needed for wheezing or shortness of breath.  [name] amitriptyline (ELAVIL) 50 MG tablet Take 50 mg by mouth at bedtime.  [name] aspirin 81 MG tablet Take 81 mg by mouth daily.  [name] BAYER MICROLET LANCETS lancets use as directed three times a day  . buPROPion (WELLBUTRIN XL) 150 MG 24 hr tablet take 1 tablet by mouth once daily  . cholecalciferol (VITAMIN D) 1000 UNITS tablet Take 1,000 Units by mouth daily.    . Cinnamon 500 MG capsule Take 1,000 mg by mouth daily.  . clopidogrel (PLAVIX) 75 MG tablet Take 1 tablet (75 mg total) by mouth daily.  . cyclobenzaprine (FLEXERIL) 10 MG tablet Take 10 mg by mouth 2 (two) times daily as needed.   . diazepam (VALIUM) 5 MG tablet Take 1-2 tablets (5-10 mg total) by mouth at bedtime as needed for anxiety.  [name] gentamicin ointment (GARAMYCIN) 0.1 % Apply 1 application topically 3 (three) times daily.  . Glucosamine-Chondroit-Vit C-Mn (GLUCOSAMINE CHONDR 500 COMPLEX) CAPS Take 1,000 each by mouth daily.  [name] glucose blood (BAYER CONTOUR TEST) test strip Use to test blood sugar three times daily as instructed.  Dx code: 250.70  . HYDROcodone-acetaminophen (NORCO/VICODIN) 5-325 MG per tablet Take 1-2 tablets by mouth every 6 (six) hours as needed for moderate pain or severe pain.  [name] insulin  NPH Human (HUMULIN N,NOVOLIN N) 100 UNIT/ML injection Inject under skin 70 units daily: in am and before dinner  . Insulin Pen Needle (NOVOFINE) 32G X 6 MM MISC Use three times daily  . insulin regular (NOVOLIN R,HUMULIN R) 100 units/mL injection Inject 110 units daily as avised  . Insulin Syringe-Needle U-100 (RELION INSULIN SYRINGE 1ML/31G) 31G X 5/16" 1 ML MISC Use 3x a day  . Lactulose 20 GM/30ML SOLN Take 30 mLs by mouth every 2 (two) hours as needed.  Elmore Guise Device MISC  1 application by Does not apply route as needed.  [name] losartan (COZAAR) 25 MG tablet take 1 tablet by mouth once daily  . magnesium gluconate (MAGONATE) 500 MG tablet Take 1,000 mg by mouth daily.  . metoprolol succinate (TOPROL-XL) 50 MG 24 hr tablet take 1 tablet by mouth twice a day  . Multiple Vitamin (MULTIVITAMIN) capsule Take 1 capsule by mouth daily.    . nitazoxanide (ALINIA) 500 MG tablet Take 1 tablet (500 mg total) by mouth 2 (two) times daily with a meal.  . nitroGLYCERIN (NITROSTAT) 0.4 MG SL tablet Place 1 tablet (0.4 mg total) under the tongue every 5 (five) minutes as needed.  . Omega-3 Fatty Acids (FISH OIL PO) Take by mouth.  [name] omeprazole (PRILOSEC) 20 MG capsule Take 1 capsule (20 mg total) by mouth 2 (two) times daily.  . polyethylene glycol (MIRALAX) packet Take 17 g by mouth daily.  . sertraline (ZOLOFT) 100 MG tablet Take 1 tablet (100 mg total) by mouth daily.  . simvastatin (ZOCOR) 20 MG tablet Take 1 tablet (20 mg total) by mouth daily at 6 PM.  . tamsulosin (FLOMAX) 0.4 MG CAPS capsule take 1 capsule by mouth every morning    Past Medical History  Diagnosis Date  . Peripheral vascular disease   . Diabetes mellitus   . Mild aortic stenosis   . Tobacco use disorder     recently quit  . Depression   . Psoriasis   . Neuropathy   . Obesity   . Post splenectomy syndrome   . SOB (shortness of breath)   . Carotid artery occlusion   . Coronary artery disease   . Hyperlipidemia   . Hypertension   . Bell's palsy 2007  . Sleep apnea   . Angina at rest     chronic  . Abnormal colonoscopy 06/2011    Dr. Minna Merritts, polyp  . Cataract     Dr. Dawna Part  . Anxiety   . Arthritis   . DVT (deep venous thrombosis)     in leg  . Stroke   . Allergy   . Heart murmur   . Tubular adenoma 08/2013    Dr. Hilarie Fredrickson    Past Surgical History  Procedure Laterality Date  . Splenectomy    . Heart stents  Jan 2011    leg stents 06/2009 and 03/2010  . Coronary artery bypass graft   09/06/2010    At [facility]: LIMA to LAD, left radial to RCA, sequential SVG to OM3 and 4  . Carotid endarterectomy  08/2010    left/ Dr. Kellie Simmering  . Pta of illiac and sfa  multiple    Dr. Ronalee Belts, s/p revision 11/2012  . Colonoscopy    . Cardiac catheterization  08/2010    LAD: 80% ISR, RCA: 80% ostial, OM 80-90%  . Coronary angioplasty      LAD: before CABG    Social History  reports that he has been smoking Cigarettes.  He has a 12 pack-year smoking history. He has never used smokeless tobacco. He reports that he does not drink alcohol or use illicit drugs.  Family History family history includes Alzheimer's disease in his mother; Breast cancer (age of onset: 24) in his sister; Colon polyps (age of onset: 45) in his paternal grandfather; Dementia in his mother; Diabetes in his paternal grandfather; Lung cancer (age of onset: 47) in his father.      Review of Systems  Constitutional: Negative.   Eyes: Negative.   Respiratory: Positive for shortness of breath.   Cardiovascular: Negative.   Gastrointestinal: Negative.   Musculoskeletal: Positive for arthralgias.  Skin: Negative.   Allergic/Immunologic: Negative.   Neurological: Negative.   Hematological: Negative.   Psychiatric/Behavioral: Negative.   All other systems reviewed and are negative.   BP 142/70 mmHg  Pulse 84  Ht 5\' 6"  (1.676 m)  Wt 275 lb 12 oz (125.079 kg)  BMI 44.53 kg/m2  Physical Exam  Constitutional: He is oriented to person, place, and time. He appears well-developed and well-nourished.  HENT:  Head: Normocephalic.  Nose: Nose normal.  Mouth/Throat: Oropharynx is clear and moist.  Eyes: Conjunctivae are normal. Pupils are equal, round, and reactive to light.  Neck: Normal range of motion. Neck supple. No JVD present.  Cardiovascular: Normal rate, regular rhythm, S1 normal, S2 normal and intact distal pulses.  Exam reveals no gallop and no friction rub.   Murmur heard.  Systolic murmur is present with a  grade of 2/6  Pulmonary/Chest: Effort normal and breath sounds normal. No respiratory distress. He has no wheezes. He has no rales. He exhibits no tenderness.  Abdominal: Soft. Bowel sounds are normal. He exhibits no distension. There is no tenderness.  Musculoskeletal: Normal range of motion. He exhibits no edema or tenderness.  Lymphadenopathy:    He has no cervical adenopathy.  Neurological: He is alert and oriented to person, place, and time. Coordination normal.  Skin: Skin is warm and dry. No rash noted. No erythema.  Psychiatric: He has a normal mood and affect. His behavior is normal. Judgment and thought content normal.      Assessment and Plan   Nursing note and vitals reviewed.

## 2014-09-17 NOTE — Patient Instructions (Signed)
You are doing well. No medication changes were made.  Please try albuterol as needed 1 to 2 puffs for shortness of break  Please call us if you have new issues that need to be addressed before your next appt.  Your physician wants you to follow-up in: 6 months.  You will receive a reminder letter in the mail two months in advance. If you don't receive a letter, please call our office to schedule the follow-up appointment.

## 2014-09-19 NOTE — Assessment & Plan Note (Signed)
We have encouraged continued exercise, careful diet management in an effort to lose weight. Limited in his activities by arthritis

## 2014-09-19 NOTE — Assessment & Plan Note (Signed)
Cholesterol is at goal on the current lipid regimen. No changes to the medications were made.  

## 2014-09-19 NOTE — Assessment & Plan Note (Signed)
Improving hemoglobin A1c over the past year Encouraged him to continue his efforts

## 2014-09-19 NOTE — Assessment & Plan Note (Signed)
He has a new sleep apnea machine. Reports it works much better for him.

## 2014-09-19 NOTE — Assessment & Plan Note (Signed)
Followed by Dr. Ronalee Belts and Dr. Fletcher Anon

## 2014-09-19 NOTE — Assessment & Plan Note (Signed)
Currently with no symptoms of angina. No further workup at this time. Continue current medication regimen. 

## 2014-09-21 DIAGNOSIS — L400 Psoriasis vulgaris: Secondary | ICD-10-CM | POA: Diagnosis not present

## 2014-09-28 DIAGNOSIS — L400 Psoriasis vulgaris: Secondary | ICD-10-CM | POA: Diagnosis not present

## 2014-09-29 ENCOUNTER — Ambulatory Visit (INDEPENDENT_AMBULATORY_CARE_PROVIDER_SITE_OTHER): Payer: Medicare Other | Admitting: Psychology

## 2014-09-29 DIAGNOSIS — F449 Dissociative and conversion disorder, unspecified: Secondary | ICD-10-CM | POA: Diagnosis not present

## 2014-09-29 DIAGNOSIS — F332 Major depressive disorder, recurrent severe without psychotic features: Secondary | ICD-10-CM

## 2014-09-30 DIAGNOSIS — L400 Psoriasis vulgaris: Secondary | ICD-10-CM | POA: Diagnosis not present

## 2014-10-05 ENCOUNTER — Ambulatory Visit (INDEPENDENT_AMBULATORY_CARE_PROVIDER_SITE_OTHER): Payer: Medicare Other | Admitting: Internal Medicine

## 2014-10-05 ENCOUNTER — Encounter: Payer: Self-pay | Admitting: Internal Medicine

## 2014-10-05 VITALS — BP 126/78 | HR 68 | Temp 97.7°F | Ht 66.0 in | Wt 278.2 lb

## 2014-10-05 DIAGNOSIS — E1159 Type 2 diabetes mellitus with other circulatory complications: Secondary | ICD-10-CM

## 2014-10-05 DIAGNOSIS — K5900 Constipation, unspecified: Secondary | ICD-10-CM | POA: Diagnosis not present

## 2014-10-05 DIAGNOSIS — I10 Essential (primary) hypertension: Secondary | ICD-10-CM | POA: Diagnosis not present

## 2014-10-05 DIAGNOSIS — R5383 Other fatigue: Secondary | ICD-10-CM | POA: Insufficient documentation

## 2014-10-05 DIAGNOSIS — R6889 Other general symptoms and signs: Secondary | ICD-10-CM | POA: Insufficient documentation

## 2014-10-05 DIAGNOSIS — R5382 Chronic fatigue, unspecified: Secondary | ICD-10-CM

## 2014-10-05 DIAGNOSIS — E1151 Type 2 diabetes mellitus with diabetic peripheral angiopathy without gangrene: Secondary | ICD-10-CM | POA: Diagnosis not present

## 2014-10-05 DIAGNOSIS — F419 Anxiety disorder, unspecified: Secondary | ICD-10-CM

## 2014-10-05 DIAGNOSIS — L400 Psoriasis vulgaris: Secondary | ICD-10-CM | POA: Diagnosis not present

## 2014-10-05 LAB — COMPREHENSIVE METABOLIC PANEL
ALT: 15 U/L (ref 0–53)
AST: 13 U/L (ref 0–37)
Albumin: 3.9 g/dL (ref 3.5–5.2)
Alkaline Phosphatase: 126 U/L — ABNORMAL HIGH (ref 39–117)
BUN: 21 mg/dL (ref 6–23)
CO2: 31 mEq/L (ref 19–32)
Calcium: 9.6 mg/dL (ref 8.4–10.5)
Chloride: 102 mEq/L (ref 96–112)
Creatinine, Ser: 1.64 mg/dL — ABNORMAL HIGH (ref 0.40–1.50)
GFR: 45.62 mL/min — ABNORMAL LOW (ref >60.00)
Glucose, Bld: 104 mg/dL — ABNORMAL HIGH (ref 70–99)
Potassium: 4.6 mEq/L (ref 3.5–5.1)
Sodium: 137 mEq/L (ref 135–145)
Total Bilirubin: 0.4 mg/dL (ref 0.2–1.2)
Total Protein: 7.9 g/dL (ref 6.0–8.3)

## 2014-10-05 LAB — CBC WITH DIFFERENTIAL/PLATELET
Basophils Absolute: 0.1 10*3/uL (ref 0.0–0.1)
Basophils Relative: 0.6 % (ref 0.0–3.0)
Eosinophils Absolute: 0.3 10*3/uL (ref 0.0–0.7)
Eosinophils Relative: 3.3 % (ref 0.0–5.0)
HCT: 37 % — ABNORMAL LOW (ref 39.0–52.0)
Hemoglobin: 12.3 g/dL — ABNORMAL LOW (ref 13.0–17.0)
Lymphocytes Relative: 16.3 % (ref 12.0–46.0)
Lymphs Abs: 1.7 10*3/uL (ref 0.7–4.0)
MCHC: 33.3 g/dL (ref 30.0–36.0)
MCV: 85.3 fl (ref 78.0–100.0)
Monocytes Absolute: 0.7 10*3/uL (ref 0.1–1.0)
Monocytes Relative: 7.1 % (ref 3.0–12.0)
Neutro Abs: 7.6 10*3/uL (ref 1.4–7.7)
Neutrophils Relative %: 72.7 % (ref 43.0–77.0)
Platelets: 274 10*3/uL (ref 150.0–400.0)
RBC: 4.34 Mil/uL (ref 4.22–5.81)
RDW: 16.4 % — ABNORMAL HIGH (ref 11.5–15.5)
WBC: 10.5 10*3/uL (ref 4.0–10.5)

## 2014-10-05 LAB — FERRITIN: Ferritin: 102.8 ng/mL (ref 22.0–322.0)

## 2014-10-05 LAB — VITAMIN B12: Vitamin B-12: 623 pg/mL (ref 211–911)

## 2014-10-05 NOTE — Assessment & Plan Note (Signed)
Lab Results  Component Value Date   HGBA1C 6.9* 09/02/2014   Pt reports good control of BG, however did not bring record today. Follow up with Dr. Cruzita Lederer as scheduled.

## 2014-10-05 NOTE — Assessment & Plan Note (Signed)
Minimal improvement in symptoms with increase in Sertraline. Will continue Sertraline, Wellbutrin. Continue prn Diazepam. Follow up with Dr. Rexene Edison as scheduled to work on coping mechanisms given ongoing home stressors.

## 2014-10-05 NOTE — Assessment & Plan Note (Signed)
Chronic constipation. Encouraged him to continue to use Miralax and prn Lactulose. Encouraged follow up colonoscopy with Dr. Hilarie Fredrickson next month.

## 2014-10-05 NOTE — Assessment & Plan Note (Signed)
Recent generalized fatigue. No focal symptoms. Likely multifactorial, with increased anxiety from stressors at home, polypharmacy with several sedating medications, and diffuse vascular disease. Will recheck CMP, CBC, and B12 with labs today. Recent thyroid function was normal. Encouraged him to limit use of sedating medications. Encouraged him to follow up with Dr. Rexene Edison regarding anxiety. Follow up here in 4 weeks.

## 2014-10-05 NOTE — Assessment & Plan Note (Signed)
BP Readings from Last 3 Encounters:  10/05/14 126/78  09/17/14 142/70  09/02/14 116/76   BP well controlled. Renal function with labs today. Continue current medications.

## 2014-10-05 NOTE — Patient Instructions (Signed)
Labs today.  Please call Dr. Hilarie Fredrickson to set up follow up colonoscopy.  Follow up in 4 weeks.

## 2014-10-05 NOTE — Progress Notes (Signed)
Subjective:    Patient ID: [name] [name], male    DOB: 03-21-1956, 59 y.o.   MRN: [id number]  HPI  59YO male presents for follow up.  Last seen 2/11. Symptoms of anxiety had worsened, so increased Sertraline to 100mg  daily. No change in anxiety noted with this increase. Notes some increased stressors with having family members live with him.Continues to follow up with Dr. Rexene Edison.  Recently seen by Dr. Rockey Situ. Started on Albuterol inhaler for dyspnea. No real change with this.  Feeling exhausted ever since 07/2014. More than normal fatigue.Feels rested with energy when wakes, but by 9:30am or 10am feels exhausted. Wearing CPAP every night. No focal symptoms of chest pain, dyspnea at rest.  Notes some constipation, but no blood in stool or black stool. Chronic for him. Typically small, pebble like BM every few days. Last colonoscopy 09/2013 showed multiple polyps. Due for repeat colonoscopy this March. Plans to schedule. Last colonoscopy 09/2013.  DM - BG have been well controlled per report. Compliant with medications.  Derm - Started light treatment for psoriasis.  Wt Readings from Last 3 Encounters:  10/05/14 278 lb 4 oz (126.213 kg)  09/17/14 275 lb 12 oz (125.079 kg)  09/02/14 280 lb (127.007 kg)      Past medical, surgical, family and social history per today's encounter.  Review of Systems  Constitutional: Positive for fatigue. Negative for fever, chills, activity change, appetite change and unexpected weight change.  Eyes: Negative for visual disturbance.  Respiratory: Negative for cough and shortness of breath.   Cardiovascular: Negative for chest pain, palpitations and leg swelling.  Gastrointestinal: Positive for constipation. Negative for nausea, vomiting, abdominal pain, diarrhea, blood in stool and abdominal distention.  Genitourinary: Negative for dysuria, urgency and difficulty urinating.  Musculoskeletal: Negative for arthralgias and gait problem.  Skin:  Negative for color change and rash.  Hematological: Negative for adenopathy.  Psychiatric/Behavioral: Negative for sleep disturbance and dysphoric mood. The patient is nervous/anxious.        Objective:    BP 126/78 mmHg  Pulse 68  Temp(Src) 97.7 F (36.5 C) (Oral)  Ht 5\' 6"  (1.676 m)  Wt 278 lb 4 oz (126.213 kg)  BMI 44.93 kg/m2  SpO2 96% Physical Exam  Constitutional: He is oriented to person, place, and time. He appears well-developed and well-nourished. No distress.  HENT:  Head: Normocephalic and atraumatic.  Right Ear: External ear normal.  Left Ear: External ear normal.  Nose: Nose normal.  Mouth/Throat: Oropharynx is clear and moist. No oropharyngeal exudate.  Eyes: Conjunctivae and EOM are normal. Pupils are equal, round, and reactive to light. Right eye exhibits no discharge. Left eye exhibits no discharge. No scleral icterus.  Neck: Normal range of motion. Neck supple. No tracheal deviation present. No thyromegaly present.  Cardiovascular: Normal rate, regular rhythm and normal heart sounds.  Exam reveals no gallop and no friction rub.   No murmur heard. Pulmonary/Chest: Effort normal and breath sounds normal. No accessory muscle usage. No tachypnea. No respiratory distress. He has no decreased breath sounds. He has no wheezes. He has no rhonchi. He has no rales. He exhibits no tenderness.  Abdominal: Soft. Bowel sounds are normal. He exhibits no distension. There is no tenderness.  Musculoskeletal: Normal range of motion. He exhibits no edema.  Lymphadenopathy:    He has no cervical adenopathy.  Neurological: He is alert and oriented to person, place, and time. No cranial nerve deficit. Coordination normal.  Skin: Skin is warm and dry.  Rash noted. Rash is pustular (erythematous pustular rash c/w known psoriasis over arms and legs). He is not diaphoretic. No erythema. No pallor.  Psychiatric: He has a normal mood and affect. His behavior is normal. Judgment and thought  content normal.          Assessment & Plan:   Problem List Items Addressed This Visit      Unprioritized   Anxiety    Minimal improvement in symptoms with increase in Sertraline. Will continue Sertraline, Wellbutrin. Continue prn Diazepam. Follow up with Dr. Rexene Edison as scheduled to work on coping mechanisms given ongoing home stressors.      Constipation    Chronic constipation. Encouraged him to continue to use Miralax and prn Lactulose. Encouraged follow up colonoscopy with Dr. Hilarie Fredrickson next month.      Fatigue - Primary    Recent generalized fatigue. No focal symptoms. Likely multifactorial, with increased anxiety from stressors at home, polypharmacy with several sedating medications, and diffuse vascular disease. Will recheck CMP, CBC, and B12 with labs today. Recent thyroid function was normal. Encouraged him to limit use of sedating medications. Encouraged him to follow up with Dr. Rexene Edison regarding anxiety. Follow up here in 4 weeks.      Relevant Orders   Ferritin   CBC w/Diff   Comprehensive metabolic panel   123456   Hypertension    BP Readings from Last 3 Encounters:  10/05/14 126/78  09/17/14 142/70  09/02/14 116/76   BP well controlled. Renal function with labs today. Continue current medications.      Type 2 diabetes mellitus with vascular disease    Lab Results  Component Value Date   HGBA1C 6.9* 09/02/2014   Pt reports good control of BG, however did not bring record today. Follow up with Dr. Cruzita Lederer as scheduled.          Return in about 4 weeks (around 11/02/2014) for Recheck.

## 2014-10-05 NOTE — Progress Notes (Signed)
Pre visit review using our clinic review tool, if applicable. No additional management support is needed unless otherwise documented below in the visit note. 

## 2014-10-06 ENCOUNTER — Ambulatory Visit (INDEPENDENT_AMBULATORY_CARE_PROVIDER_SITE_OTHER): Payer: Medicare Other | Admitting: Psychology

## 2014-10-06 ENCOUNTER — Other Ambulatory Visit: Payer: Self-pay | Admitting: *Deleted

## 2014-10-06 DIAGNOSIS — F449 Dissociative and conversion disorder, unspecified: Secondary | ICD-10-CM

## 2014-10-06 DIAGNOSIS — F332 Major depressive disorder, recurrent severe without psychotic features: Secondary | ICD-10-CM

## 2014-10-06 MED ORDER — SIMVASTATIN 20 MG PO TABS: 20.0000 mg | ORAL_TABLET | Freq: Every day | ORAL | Status: DC

## 2014-10-07 DIAGNOSIS — L400 Psoriasis vulgaris: Secondary | ICD-10-CM | POA: Diagnosis not present

## 2014-10-08 ENCOUNTER — Encounter: Payer: Self-pay | Admitting: Internal Medicine

## 2014-10-08 ENCOUNTER — Ambulatory Visit (INDEPENDENT_AMBULATORY_CARE_PROVIDER_SITE_OTHER): Payer: Medicare Other | Admitting: Internal Medicine

## 2014-10-08 VITALS — BP 124/72 | HR 88 | Temp 97.6°F | Resp 16 | Wt 277.6 lb

## 2014-10-08 DIAGNOSIS — I25119 Atherosclerotic heart disease of native coronary artery with unspecified angina pectoris: Secondary | ICD-10-CM | POA: Diagnosis not present

## 2014-10-08 DIAGNOSIS — E1151 Type 2 diabetes mellitus with diabetic peripheral angiopathy without gangrene: Secondary | ICD-10-CM

## 2014-10-08 DIAGNOSIS — E1159 Type 2 diabetes mellitus with other circulatory complications: Secondary | ICD-10-CM

## 2014-10-08 NOTE — Progress Notes (Signed)
Patient ID: [name] [name], male   DOB: 1955-10-05, 59 y.o.   MRN: [id number]  HPI: [name] [name] is a 59 y.o.-year-old male, returning for f/u for DM2, dx 2006, insulin-dependent since 2011, uncontrolled, with complications (CAD - CABG 2012, PVD - s/p Stents, Aortic and carotid stenosis, peripheral neuropathy). Last visit 3 mo ago.  Last hemoglobin A1c: Lab Results  Component Value Date   HGBA1C 6.9* 09/02/2014   HGBA1C 7.2* 06/01/2014   HGBA1C 8.3* 03/02/2014   Pt was on a regimen of: - Lantus 60 units bid >> 60 units in HS >> 80 units in HS - Novolog 14 >> 17 >> 25-25-30 We stopped Victoza at a previous visit. He could not afford his insulins (doughnut hole) - 400$.   He is now on:  Insulin Before breakfast Before lunch Before dinner  Regular (short acting) 45 25 45  NPH (long acting) 35   25    Pt checks his sugars 3x a day and they are still close to goal (reviewed log): - am: 1125-378 (457) >> 150-180 >> 86, 133-289 >> 95-180, 190 >> 76, 98-163, 180, 190 >> 132-144 - 2h after b'fast: 226-271 (348) >> 198, 239 >> n/c >> 363, 377 >> 148 >> 139-148 >> n/c - before lunch: 108, 145-204, 274 >> 106-207 >> 134-187, 192, 207 >> 108 - 2h after lunch: 84-300 (332 x1) >> n/c >> 140-233, 258 >> 104-180 >> 94, 115-189 >> 130-160, 215 - before dinner: 53 x1, 122-352 >> 120-150 >> 152-188 >> 95-150 >> 87, 95-172, 188 >> n/c - 2h after dinner:166-200, 255 >> 114-187, 272x1 >> 101-136 lately , 175 >> 89, 139-188 - bedtime: 210-220 >> 242, 319 >> 265 >> n/c >> 127-135 >> 101-276 >> 225 >> n/c >> 106-129 No lows. Lowest sugar was 69; ? At what level he has hypoglycemia awareness. Highest sugar was 377 >> 272 >> 224 >> 254 - few 200s.   Pt's meals are: - Breakfast: toast + eggs or cereals + milk - Lunch: soup + 1/2 sandwich - Dinner: meat + sweet potatoes or pasta + salad - Snacks: 3: sugar free foods, fruit He quit drinking sodas, but drinks tea - artificial sweetener. He also started to  drink Almond milk. Exercises by walking 2x a day.  - He has CKD, last BUN/creatinine:  Lab Results  Component Value Date   BUN 21 10/05/2014   CREATININE 1.64* 10/05/2014  He is on Cozaar.Sees nephrology. Has a stone in L kidney, has 3 cysts in R kidney. - last set of lipids: Lab Results  Component Value Date   CHOL 98 09/02/2014   HDL 27.00* 09/02/2014   LDLCALC 40 09/02/2014   LDLDIRECT 64.9 06/01/2014   TRIG 156.0* 09/02/2014   CHOLHDL 4 09/02/2014  He  Is on Zocor. - last eye exam was in 08/2014 [facility] DR. + cataracts. - + numbness and tingling in his feet - [place] [name] [name]) - Dr [name] [name]. He had a foot exam 01/11/2014.   He has pain in L leg >> surgery for PVD stents on 04/27/2014. His sugars improved after the stent.   I reviewed pt's medications, allergies, PMH, social hx, family hx, and changes were documented in the history of present illness. Otherwise, unchanged from my initial visit note.   ROS: Constitutional: + weight loss/gain, + fatigue, + feeling hot and cold, + nocturia, + poor sleep Eyes: no blurry vision, no xerophthalmia ENT: no sore throat, no nodules palpated in throat, +  dysphagia/no odynophagia,no  hoarseness, + decreased hearing Cardiovascular: no CP/+ SOB/no palpitations/+ leg swelling Respiratory:no cough/+ SOB Gastrointestinal: no N/V/D/+ C Musculoskeletal: + muscle aches/+ bone pain  Skin: no rash Neurological: no tremors/no numbness/tingling/dizziness, + HA + diff with erections, + low libido  PE: BP 124/72 mmHg  Pulse 88  Temp(Src) 97.6 F (36.4 C) (Oral)  Resp 16  Wt 277 lb 9.6 oz (125.919 kg)  SpO2 95% Wt Readings from Last 3 Encounters:  10/08/14 277 lb 9.6 oz (125.919 kg)  10/05/14 278 lb 4 oz (126.213 kg)  09/17/14 275 lb 12 oz (125.079 kg)   Constitutional: obese, in NAD, ruddy complexion Eyes: PERRLA, EOMI, no exophthalmos ENT: dry mucous membranes, no thyromegaly, no cervical  lymphadenopathy Cardiovascular: RRR, +2/6 SEM, no RG Respiratory: CTA B Gastrointestinal: abdomen soft, NT, ND, BS+ Musculoskeletal: no deformities, strength intact in all 4 Skin: moist, warm, + rash - rosacea on face  Neurological: no tremor with outstretched hands, DTR normal in all 4  ASSESSMENT: 1. DM2, insulin-dependent, uncontrolled, with complications - CAD - s/p CABG 2012 - PVD - s/p stents 2012 - Aortic stenosis - carotid stenosis - CKD - peripheral neuropathy   PLAN:  1. Patient with long-standing, uncontrolled diabetes, on injectable regimen - his sugars are better (per review of his log and the last HbA1c) after we switched to NPH and Regular insulin regimen, after his stent placement sx, and after starting to reduce portions. Last HbA1c 6.9%.  - will continue current regimen  Patient Instructions  Please continue:  Insulin Before breakfast Before lunch Before dinner  Regular (short acting) 45 25 45  NPH (long acting) 35   25    Please come back for a follow-up appointment in 3 months.  - continue to check 3 times a day, rotating checks - had a recent eye exam >> advised to schedule - check HbA1c at next visit; reviewed HbA1c obtained by PCP - congratulated him for his improved DM ctrl! - Return to clinic in 3 mo with sugar log

## 2014-10-08 NOTE — Patient Instructions (Addendum)
Please continue:   Insulin Before breakfast Before lunch Before dinner  Regular (short acting) 45 25 45  NPH (long acting) 40  30    Please come back for a follow-up appointment in 3 months.

## 2014-10-12 DIAGNOSIS — L400 Psoriasis vulgaris: Secondary | ICD-10-CM | POA: Diagnosis not present

## 2014-10-14 DIAGNOSIS — L400 Psoriasis vulgaris: Secondary | ICD-10-CM | POA: Diagnosis not present

## 2014-10-18 DIAGNOSIS — E1142 Type 2 diabetes mellitus with diabetic polyneuropathy: Secondary | ICD-10-CM | POA: Diagnosis not present

## 2014-10-18 DIAGNOSIS — B351 Tinea unguium: Secondary | ICD-10-CM | POA: Diagnosis not present

## 2014-10-18 DIAGNOSIS — L851 Acquired keratosis [keratoderma] palmaris et plantaris: Secondary | ICD-10-CM | POA: Diagnosis not present

## 2014-10-26 DIAGNOSIS — L400 Psoriasis vulgaris: Secondary | ICD-10-CM | POA: Diagnosis not present

## 2014-10-27 ENCOUNTER — Ambulatory Visit: Payer: Medicare Other | Admitting: Psychology

## 2014-10-28 DIAGNOSIS — L905 Scar conditions and fibrosis of skin: Secondary | ICD-10-CM | POA: Diagnosis not present

## 2014-10-28 DIAGNOSIS — L400 Psoriasis vulgaris: Secondary | ICD-10-CM | POA: Diagnosis not present

## 2014-10-28 DIAGNOSIS — R208 Other disturbances of skin sensation: Secondary | ICD-10-CM | POA: Diagnosis not present

## 2014-10-28 DIAGNOSIS — L910 Hypertrophic scar: Secondary | ICD-10-CM | POA: Diagnosis not present

## 2014-11-03 ENCOUNTER — Ambulatory Visit: Payer: Medicare Other | Admitting: Psychology

## 2014-11-05 ENCOUNTER — Ambulatory Visit (INDEPENDENT_AMBULATORY_CARE_PROVIDER_SITE_OTHER): Payer: Medicare Other | Admitting: Internal Medicine

## 2014-11-05 ENCOUNTER — Encounter: Payer: Self-pay | Admitting: Internal Medicine

## 2014-11-05 VITALS — BP 134/76 | HR 72 | Temp 97.7°F | Ht 66.0 in | Wt 286.1 lb

## 2014-11-05 DIAGNOSIS — R5382 Chronic fatigue, unspecified: Secondary | ICD-10-CM

## 2014-11-05 DIAGNOSIS — I25119 Atherosclerotic heart disease of native coronary artery with unspecified angina pectoris: Secondary | ICD-10-CM | POA: Diagnosis not present

## 2014-11-05 DIAGNOSIS — E1151 Type 2 diabetes mellitus with diabetic peripheral angiopathy without gangrene: Secondary | ICD-10-CM | POA: Diagnosis not present

## 2014-11-05 DIAGNOSIS — I10 Essential (primary) hypertension: Secondary | ICD-10-CM | POA: Diagnosis not present

## 2014-11-05 DIAGNOSIS — M545 Low back pain: Secondary | ICD-10-CM

## 2014-11-05 DIAGNOSIS — N139 Obstructive and reflux uropathy, unspecified: Secondary | ICD-10-CM

## 2014-11-05 DIAGNOSIS — E1159 Type 2 diabetes mellitus with other circulatory complications: Secondary | ICD-10-CM

## 2014-11-05 NOTE — Progress Notes (Signed)
Pre visit review using our clinic review tool, if applicable. No additional management support is needed unless otherwise documented below in the visit note. 

## 2014-11-05 NOTE — Assessment & Plan Note (Signed)
Secondary to BPH. Reluctant to use meds. Will set up urology evaluation.

## 2014-11-05 NOTE — Assessment & Plan Note (Signed)
BP Readings from Last 3 Encounters:  11/05/14 134/76  10/08/14 124/72  10/05/14 126/78   BP well controlled. Renal function stable. Continue current medication.

## 2014-11-05 NOTE — Assessment & Plan Note (Signed)
Recheck A1c with labs in 11/2014.

## 2014-11-05 NOTE — Patient Instructions (Addendum)
Consider reading the book "Always Hungry" by Isabella Stalling.  We will set up evaluation with Dr. Erlene Quan in Urology.  Continue Flexeril as needed for back pain.

## 2014-11-05 NOTE — Assessment & Plan Note (Signed)
Wt Readings from Last 3 Encounters:  11/05/14 286 lb 2 oz (129.785 kg)  10/08/14 277 lb 9.6 oz (125.919 kg)  10/05/14 278 lb 4 oz (126.213 kg)   Body mass index is 46.2 kg/(m^2). The patient is asked to make an attempt to improve diet and exercise patterns to aid in medical management of this problem.

## 2014-11-05 NOTE — Assessment & Plan Note (Signed)
Chronic 2/2 OA. Continue Flexeril as needed.

## 2014-11-05 NOTE — Progress Notes (Signed)
Subjective:    Patient ID: [name] [name], male    DOB: [birth year], 59 y.o.   MRN: [id number]  HPI  59YO male presents for follow up. Last visit 3/15 for fatigue. Continues to feel fatigued. Having some chronic low back pain which has limited ability to get out. Fell twice at home last week. Did not seek care for this. Was using Flexeril for back pain, but this hasn't been working as well. Has also tried ice, heat. Seen by Dr. Primus Bravo in the past and had ESI with temp improvement.  Plans to see urologist in [facility] to evaluate chronic urinary obstruction. Continues to have weak flow    BP Readings from Last 3 Encounters:  11/05/14 134/76  10/08/14 124/72  10/05/14 126/78    Wt Readings from Last 3 Encounters:  11/05/14 286 lb 2 oz (129.785 kg)  10/08/14 277 lb 9.6 oz (125.919 kg)  10/05/14 278 lb 4 oz (126.213 kg)   Body mass index is 46.2 kg/(m^2).  Past medical, surgical, family and social history per today's encounter.  Review of Systems  Constitutional: Positive for fatigue. Negative for fever, chills, activity change, appetite change and unexpected weight change.  Eyes: Negative for visual disturbance.  Respiratory: Negative for cough and shortness of breath.   Cardiovascular: Negative for chest pain, palpitations and leg swelling.  Gastrointestinal: Positive for constipation. Negative for nausea, vomiting, abdominal pain, diarrhea and abdominal distention.  Genitourinary: Negative for dysuria, urgency and difficulty urinating.  Musculoskeletal: Positive for myalgias, back pain and arthralgias. Negative for gait problem.  Skin: Negative for color change and rash.  Hematological: Negative for adenopathy.  Psychiatric/Behavioral: Negative for sleep disturbance and dysphoric mood. The patient is not nervous/anxious.        Objective:    BP 134/76 mmHg  Pulse 72  Temp(Src) 97.7 F (36.5 C) (Oral)  Ht 5\' 6"  (1.676 m)  Wt 286 lb 2 oz (129.785 kg)  BMI 46.20  kg/m2  SpO2 98% Physical Exam  Constitutional: He is oriented to person, place, and time. He appears well-developed and well-nourished. No distress.  HENT:  Head: Normocephalic and atraumatic.  Right Ear: External ear normal.  Left Ear: External ear normal.  Nose: Nose normal.  Mouth/Throat: Oropharynx is clear and moist. No oropharyngeal exudate.  Eyes: Conjunctivae and EOM are normal. Pupils are equal, round, and reactive to light. Right eye exhibits no discharge. Left eye exhibits no discharge. No scleral icterus.  Neck: Normal range of motion. Neck supple. No tracheal deviation present. No thyromegaly present.  Cardiovascular: Normal rate, regular rhythm and normal heart sounds.  Exam reveals no gallop and no friction rub.   No murmur heard. Pulmonary/Chest: Effort normal and breath sounds normal. No accessory muscle usage. No tachypnea. No respiratory distress. He has no decreased breath sounds. He has no wheezes. He has no rhonchi. He has no rales. He exhibits no tenderness.  Musculoskeletal: Normal range of motion. He exhibits no edema.       Lumbar back: He exhibits pain.  Lymphadenopathy:    He has no cervical adenopathy.  Neurological: He is alert and oriented to person, place, and time. No cranial nerve deficit. Coordination normal.  Skin: Skin is warm and dry. No rash noted. He is not diaphoretic. No erythema. No pallor.  Psychiatric: He has a normal mood and affect. His behavior is normal. Judgment and thought content normal.          Assessment & Plan:   Problem List Items Addressed  This Visit      Unprioritized   Fatigue - Primary   Hypertension    BP Readings from Last 3 Encounters:  11/05/14 134/76  10/08/14 124/72  10/05/14 126/78   BP well controlled. Renal function stable. Continue current medication.      Lumbar back pain    Chronic 2/2 OA. Continue Flexeril as needed.      Severe obesity (BMI >= 40)    Wt Readings from Last 3 Encounters:    11/05/14 286 lb 2 oz (129.785 kg)  10/08/14 277 lb 9.6 oz (125.919 kg)  10/05/14 278 lb 4 oz (126.213 kg)   Body mass index is 46.2 kg/(m^2). The patient is asked to make an attempt to improve diet and exercise patterns to aid in medical management of this problem.       Type 2 diabetes mellitus with vascular disease    Recheck A1c with labs in 11/2014.      Urinary obstruction    Secondary to BPH. Reluctant to use meds. Will set up urology evaluation.      Relevant Orders   Ambulatory referral to Urology       Return in about 4 weeks (around 12/03/2014) for Recheck of Diabetes.

## 2014-11-09 ENCOUNTER — Encounter: Payer: Self-pay | Admitting: Internal Medicine

## 2014-11-09 DIAGNOSIS — L400 Psoriasis vulgaris: Secondary | ICD-10-CM | POA: Diagnosis not present

## 2014-11-10 ENCOUNTER — Ambulatory Visit (INDEPENDENT_AMBULATORY_CARE_PROVIDER_SITE_OTHER): Payer: Medicare Other | Admitting: Psychology

## 2014-11-10 DIAGNOSIS — F332 Major depressive disorder, recurrent severe without psychotic features: Secondary | ICD-10-CM

## 2014-11-10 DIAGNOSIS — F449 Dissociative and conversion disorder, unspecified: Secondary | ICD-10-CM | POA: Diagnosis not present

## 2014-11-11 DIAGNOSIS — L400 Psoriasis vulgaris: Secondary | ICD-10-CM | POA: Diagnosis not present

## 2014-11-12 NOTE — Op Note (Signed)
PATIENT NAME:  [name] [name], [name] [name] MR#:  [id number] DATE OF BIRTH:  Apr 18, 1956  DATE OF PROCEDURE:  07/29/2012  PREOPERATIVE DIAGNOSIS: Atherosclerotic occlusive disease bilateral lower extremities with lifestyle-limiting claudication and rest pain symptoms.   POSTOPERATIVE DIAGNOSIS: Atherosclerotic occlusive disease bilateral lower extremities with lifestyle-limiting claudication and rest pain symptoms.   PROCEDURE PERFORMED:  1. Abdominal aortogram.  2. Left lower extremity distal runoff, third-order catheter placement.  3. Percutaneous transluminal angioplasty to 6 mm, left superficial femoral artery.  4. Percutaneous transluminal angioplasty to 4 mm, left popliteal, separate and distinct site.  5. Percutaneous transluminal angioplasty left anterior tibial to 3.5 mm.   SURGEON: Katha Cabal, MD  SEDATION: Versed 4 mg plus fentanyl 150 mcg administered IV. Continuous ECG, pulse oximetry and cardiopulmonary monitoring is performed throughout the entire procedure by the interventional radiology nurse. Total sedation time was 1 hour, 30 minutes.   ACCESS: A 6-French sheath, right superficial femoral artery.   FLUOROSCOPY TIME: 12.3 minutes.   CONTRAST USED: Isovue 85 mL.   INDICATIONS: The patient is a 59 year old gentleman with multiple medical problems who presents to the office earlier than his scheduled followup with increasing complaints of pain in his left lower extremity. He is experiencing some pain at night and is noting pain in his left leg with ambulating around the house. Risks and benefits of re-intervention were reviewed and the patient requests to proceed secondary to the degree of his debility.   DESCRIPTION OF PROCEDURE: The patient is taken to special procedures and placed in the supine position. After adequate sedation is achieved, the patient is prepped and draped in sterile fashion. Ultrasound is placed in a sterile sleeve. Ultrasound is utilized secondary to lack of  appropriate landmarks. The patient is morbidly obese with a BMI of approximately 45. With ultrasound, the femoral artery is identified. This is used in conjunction with fluoroscopy insuring that the access site is over the femoral head. Micropuncture needle is then used to access the anterior wall of the femoral artery and micro wire is advanced without difficulty. MicroSheath, Amplatz Super Stiff wire, followed by a 5-French sheath and then a 5-French pigtail catheter. The pigtail catheter is positioned at the level of T12 and AP projection of the abdominal aorta is obtained. Pigtail catheter is repositioned and an RAO projection of the pelvis is obtained. A stiff angled Glidewire and pigtail catheter are then utilized to cross the bifurcation and advance down to the proximal common femoral where an LAO projection of the common femoral artery and femoral bifurcation is obtained. Wire is reintroduced and the catheter is negotiated into the SFA and distal runoff is then obtained. Images distal to the popliteal occlusion are inadequate and a follow-up angiography is performed once the lesion has been crossed with hand injection via a straight slip catheter.   The popliteal is occluded as is the origin of the anterior tibial. These are crossed with a straight slip catheter and a stiff angled Glidewire, 4000 units of heparin is given. The 6-French Ansel is advanced up and over the bifurcation, positioned with its tip in the mid-common femoral. Initially, a 3.5 x 8 balloon is advanced across the proximal anterior tibial, up into the popliteal. This is inflated to 12 atmospheres for approximately 1.5 minutes. A 4 x 12 balloon is then positioned from the distal popliteal, extending more proximally to the level of Hunter's canal and this is inflated to 12 atmospheres for 1 minute. Follow-up angiography demonstrates the transition in the anterior tibial is  stenotic and therefore, a 3 mm x 2 cm balloon is advanced across  this area within the anterior tibial at the level of the curve or bend noted proximally and inflated to 10 atmospheres for 1 minute. Follow-up angiography now demonstrates the popliteal artery from the level of the femoral condyles through the anterior tibial which is the single vessel runoff is now widely patent with less than 5% residual stenosis and no evidence of hemodynamically significant stenosis.   Attention is then turned to the SFA where within the stent at the proximal margin of the stent and then the overlap of the two previously placed stents, there is a greater than 70% stenosis and this is treated with a single inflation of a 6 x 12 Fox balloon inflated to 18 atmospheres. Follow-up angiography demonstrates approximately 10% residual stenosis without significant hemodynamically significant narrowing. Distal runoff is then performed down to the ankle and shows wide patency of the femoral popliteal system as well as the anterior tibial filling and dorsalis pedis.   Sheath is then pulled back into the external iliac on the right. Oblique view of the right groin is then obtained and proximal superficial femoral artery puncture is identified. The patient has a very short common femoral with a high bifurcation and the puncture is located fluoroscopically over the femoral head. Given this, the Ansel is exchanged for a 6-French 11 cm Pinnacle and a Mynx device is deployed under fluoroscopic guidance. There are no immediate complications.   SUMMARY: Successful recanalization of an occluded popliteal as well as occluded proximal anterior tibial. Also, treatment of a restenosis within the stented segment of the superficial femoral artery as described.     ____________________________ Katha Cabal, MD ggs:es D: 07/29/2012 11:04:56 ET T: 07/29/2012 14:03:31 ET JOB#: [id number]  cc: Katha Cabal, MD, <Dictator> Eduard Clos. Gilford Rile, MD Katha Cabal MD ELECTRONICALLY SIGNED 08/01/2012  7:51

## 2014-11-12 NOTE — Op Note (Signed)
PATIENT NAME:  [name] [name], [name] [name] MR#:  [id number] DATE OF BIRTH:  07-Jan-1956  DATE OF PROCEDURE:  12/02/2012  PREOPERATIVE DIAGNOSIS: Atherosclerotic occlusive disease, bilateral lower extremities with rest pain.   POSTOPERATIVE DIAGNOSIS: Atherosclerotic occlusive disease, bilateral lower extremities with rest pain.   PROCEDURES PERFORMED:  1. Abdominal aortogram.  2. Left lower extremity distal runoff, third order catheter placement.  3. Percutaneous transluminal angioplasty of the tibioperoneal trunk to a maximal diameter 4 mm, left side.  4. Placement of a Supera stent 4.5 mm diameter left tibioperoneal trunk and distal popliteal.  5. Percutaneous transluminal angioplasty to 6 mm mid-superficial femoral artery.   SURGEON: Katha Cabal, MD  SEDATION: Precedex drip with IV fentanyl. Continuous ECG, pulse oximetry and cardiopulmonary monitoring was performed throughout the entire procedure by the interventional radiology nurse. Total sedation time was 1 hour, 50 minutes.   ACCESS: A 6-French sheath, right common femoral artery.   FLUOROSCOPY TIME: 17.0 minutes.   CONTRAST USED: Isovue 142 mL.   INDICATIONS: [name] is a 59 year old gentleman well known to our service who presented with increasing pain particularly at rest and during the night of the left lower extremity. Noninvasive studies as well as physical examination demonstrated significant distal disease. Risks and benefits for angiography and possible intervention were reviewed. All questions were answered. The patient agrees to proceed.   DESCRIPTION OF PROCEDURE: The patient is taken to special procedures and placed in the supine position. After adequate sedation is achieved, his groins are prepped and draped in sterile fashion. Using fluoroscopy to localize the femoral head and make note of the previously placed stents and subsequently with ultrasound guidance. Ultrasound guidance was performed after placing the probe in  a sterile sleeve. The common femoral artery was imaged. Image was recorded for the permanent record. Under real-time visualization, the Seldinger needle is inserted into the common femoral artery. J-wire is advanced. MicroSheath sheath is advanced over the J-wire and subsequently an Amplatz extra stiff wire is advanced under fluoroscopic guidance. The 5-French sheath is then placed and a pigtail catheter advanced over the Amplatz wire. The pigtail catheter is positioned at T12 and AP projection of the aorta is obtained. Pigtail catheter is repositioned and an RAO projection of the pelvis is obtained. Does not appear to be any hemodynamically significant lesions within the aortoiliac system and an Advantage wire with the pigtail catheter is then utilized to cross the aortic bifurcation. The catheter is advanced into first the common femoral for LAO projection of the femoral arteries and then the wire and catheter are negotiated into the SFA and distal runoff is completed. Occlusion of the popliteal tibioperoneal trunk is noted. There is reconstitution of the the peroneal artery and the peroneal is the dominant vessel. Anterior tibial fills, but it is diffusely diseased. Posterior tibial does not fill at all throughout the distal runoff. 5000 units of heparin is given and using the combination of a straight slip catheter and the Advantage wire, the occlusion is crossed and the catheter is advanced down into the mid-portion of the peroneal. Hand injection of contrast demonstrates distal runoff and intraluminal placement. The 18 wire is then introduced through the catheter, catheter removed and initially a 2.5 x 10 balloon is used to angioplasty the proximal peroneal tibioperoneal trunk and popliteal. Subsequently, a 3 balloon is used, then a 4 balloon is used for the tibioperoneal trunk and popliteal, 4 mm balloon which is a Fox balloon which on a second inflation, inflated to 18 atmospheres  for a maximal dimension of  4.5. Follow-up angiography is performed after each individual inflation. Significant dissection is noted and there is now staining of the venous system as well. The Supera 4.5 x 100 stent is then advanced over the wire under fluoroscopic guidance and positioned across the proximal 2 cm of the tibioperoneal trunk extending back into the popliteal. It is then deployed without problem. Follow-up angiography demonstrates that the popliteal and tibioperoneal trunk are now widely patent. There is a slight mismatch at the interface between the peroneal artery and the distal edge of the stent and the 3 mm Fox balloon is reintroduced and this time, it is inflated to 18 atmospheres for 1.5 minutes across this area. This gives a maximal diameter of 3.5 and follow-up imaging now demonstrates an excellent, very smooth, tapered contour to the distal tibioperoneal trunk and proximal peroneal.   Imaging is then performed of the mid-SFA within the previously stented segment. There is approximately 70 to 75% stenosis identified with 50% stenosis noted more distally and therefore, a 6 x 10 balloon is advanced over the wire and used to angioplasty this 75% lesion, encompassing the more distal disease. Follow-up angiography demonstrates complete resolution of this lesion.   The sheath is then pulled into the right external iliac. Oblique view of the groin is obtained and a Mynx device deployed successfully. Prior to deploying the device and moving the sheath, a final distal runoff was obtained.   There are no immediate complications.   INTERPRETATION: The abdominal aorta and bilateral iliac arteries are imaged with contrast. Given the patient's body habitus, the images are somewhat light, but there does not appear to be any hemodynamically significant stenoses in the previously placed stents on both the right and left iliac systems appear to be widely patent.   The right common femoral, profunda femoris and proximal several  centimeters of the SFA which have been stented are all widely patent.   The left common femoral and profunda femoris are patent. Superficial femoral artery is patent. It is previously stented in its mid-portion just above Hunter's canal and there is a 70 to 75% restenosis within the stented segment, as well as a more diffuse 50 to 60% stenosis just distal to this. Hunter's canal is patent. In the mid-popliteal, there is a focal 50 to 60% stenosis. Distally, the distal one-third of the popliteal as well as the tibioperoneal trunk and proximal centimeter or so of the peroneal are occluded. Following angioplasty beginning at 2.5 and stepping up the size of the diameter balloons to 3, 4, and then 4.5, there is now patency of the distal popliteal tibioperoneal trunk as well as the peroneal. However, there is significant dissection and staining of the venous system. Supera stent is then deployed with an excellent result, no post dilatation is required and there is now resolution of the dissection with wide patency. Distal end of the stent at the interface with the distal tibioperoneal trunk and peroneal is retouched with a 3.5 mm inflation and this creates a very smooth tapered contour. As noted above, the SFA lesion is angioplastied with a 6 mm balloon and this yields an excellent result with resolution of this area of stenosis as well.   SUMMARY: Successful recanalization of the right lower extremity as described above.    ____________________________ Katha Cabal, MD ggs:es D: 12/02/2012 14:39:27 ET T: 12/02/2012 15:18:50 ET JOB#: [id number]  cc: Katha Cabal, MD, <Dictator> Anderson Malta A. Gilford Rile, MD Katha Cabal MD ELECTRONICALLY  SIGNED 12/09/2012 9:28

## 2014-11-13 NOTE — Op Note (Signed)
PATIENT NAME:  [name] [name], LAWS MR#:  [id number] DATE OF BIRTH:  May 02, 1956  DATE OF PROCEDURE:  05/04/2014  PREOPERATIVE DIAGNOSES:  1. Peripheral arterial disease with claudication, left lower extremity.  2. Status post bilateral lower extremity interventions previously for peripheral arterial disease.  3. Diabetes mellitus.  4. Morbid obesity.  5. Hypertension.  6. Coronary disease.  POSTOPERATIVE DIAGNOSES:  1. Peripheral arterial disease with claudication, left lower extremity.  2. Status post bilateral lower extremity interventions previously for peripheral arterial disease.  3. Diabetes mellitus.  4. Morbid obesity.  5. Hypertension.  6. Coronary disease.    PROCEDURES:  1. Ultrasound Guidance for vascular access to right femoral artery.  2. Catheter placement to left peroneal artery from right femoral approach.  3. Aortogram and selective left lower extremity angiogram.  4. Percutaneous transluminal angioplasty with drug-coated angioplasty balloon using a 6 mm diameter Lutonix drug-coated angioplasty balloon for recurrent stenosis in the left SFA.  5. Percutaneous transluminal angioplasty with Lutonix drug-coated angioplasty balloon, 4 mm, to the distal popliteal artery, tibioperoneal trunk, and proximal peroneal artery for recurrent stenosis.  6. StarClose closure device, right femoral artery.   SURGEON: Algernon Huxley, M.D.   ANESTHESIA: Local with moderate conscious sedation.   ESTIMATED BLOOD LOSS: 25 mL.   INDICATION FOR PROCEDURE: This is a 59 year old, morbidly obese gentleman with multiple medical comorbidities. He has recurrent short distance claudication symptoms to the left lower extremity. He has undergone previous interventions bilaterally. Noninvasive studies showed areas of stenosis and a drop in ABI on the left. He desired intervention for his symptoms. The risks and benefits were discussed. Informed consent was obtained.   DESCRIPTION OF PROCEDURE: The  patient was brought to the vascular suite. The groins were shaved and prepped, and a sterile surgical field was created. The right femoral head was localized with fluoroscopy and also with ultrasonography. Due to his very large size, access was not particularly easy, but we were able to gain access with a micropuncture needle using a combination of ultrasound and fluoroscopy, and stick just in the common femoral artery above the previously placed SFA stent to the origin of the SFA and below the iliac stent that came down to the distal external iliac artery. A micropuncture wire and sheath were then placed and we upsized to a 5 [place] sheath. A pigtail catheter was placed in the aorta at the L1 and L2 level and AP aortogram was performed. This demonstrated a patent aorta with patent renal arteries bilaterally. The iliac stents that had been previously placed were patent. I then crossed the aortic bifurcation and advanced to the left femoral head and selective left lower extremity angiogram was then performed. This demonstrated the SFA stent to have 2 areas of moderate stenosis in the 60% to 70% range, one at the junction of the 2 previously stents and one at the bottom of the lower stent. The artery was then patent for the next 10 to 12 cm in the distal SFA and above-knee popliteal artery. The stent in the distal popliteal artery and tibioperoneal trunk had a high-grade recurrent stenosis in the 90% range, most notable in the tibioperoneal trunk location, and leading into the proximal peroneal artery.   The patient was given 5000 units of intravenous heparin for systemic anticoagulation. A 6 French Ansel sheath was placed over a Terumo Advantage wire and I was able to cross the SFA, the popliteal, TP trunk and peroneal stenoses, and confirm intraluminal flow in the peroneal artery  distally. I then replaced the 0.035 wire. I used a 4 mm diameter x 15 cm in length Lutonix drug-coated angioplasty balloon for the  proximal peroneal artery, tibioperoneal trunk and popliteal artery up to the knee. This treated the area of recurrent stenosis; however, there was still residual waste, and I had to use a high-pressure 4 mm diameter angioplasty balloon at the tibioperoneal trunk to help resolve the waste. This resulted in markedly improved flow with only about a 20% residual stenosis in this location. A 6 mm diameter x 15 cm in length Lutonix drug-coated angioplasty balloon was then taken to the recurrent SFA stenosis, which was separate and distinct. Narrowing was seen, which resolved with angioplasty and completion angiogram showed about a 20% residual stenosis which was not flow limiting in the midportion of the junction of 2 stents. The distal portion only about a 10% residual stenosis.   At this point, I elected to terminate the procedure. The sheath was removed. StarClose closure device was deployed in the usual fashion with excellent hemostatic result. The patient tolerated the procedure well and was taken to the recovery room in stable condition.    ____________________________ Algernon Huxley, MD jsd:JT D: 05/04/2014 11:28:00 ET T: 05/04/2014 13:17:46 ET JOB#: [id number]  cc: Algernon Huxley, MD, <Dictator> Algernon Huxley MD ELECTRONICALLY SIGNED 05/08/2014 12:36

## 2014-11-14 NOTE — Op Note (Signed)
PATIENT NAME:  [name] [name], [name] [name] MR#:  [id number] DATE OF BIRTH:  Jul 26, 1955  DATE OF PROCEDURE:  08/24/2011  PREOPERATIVE DIAGNOSIS: Atherosclerotic occlusive disease bilateral lower extremities with rest pain right lower extremity.   POSTOPERATIVE DIAGNOSIS: Atherosclerotic occlusive disease bilateral lower extremities with rest pain right lower extremity.  PROCEDURES PERFORMED:  1. Abdominal aortogram.  2. Right lower extremity distal runoff, third order catheter placement.  3. Percutaneous transluminal angioplasty of the right popliteal and superficial femoral arteries.  4. Percutaneous transluminal angioplasty to 3.5 mm of the right peroneal artery.   SURGEON: Katha Cabal, MD  SEDATION: Precedex drip. Continuous ECG, pulse oximetry and cardiopulmonary monitoring was performed throughout the entire procedure by the interventional radiology nurse. Total sedation time was approximately two hours.   ACCESS: 6 French sheath, left common femoral artery.   CONTRAST USED: Isovue 85 mL.   FLUOROSCOPY TIME: 8.1 minutes.   INDICATIONS: [name] [name] is a 59 year old gentleman who was undergone interventions in the past. He presented to the office with increasing pain in his foot. Physical examination as well as noninvasive studies showed significant worsening of his atherosclerotic burden consistent now with rest pain and the patient is undergoing angiography for limb salvage. Risks and benefits were reviewed. All questions answered. Patient agrees to proceed.   DESCRIPTION OF PROCEDURE: Patient is taken to special procedures suite, placed in the supine position. After adequate sedation is achieved the patient's left groin is prepped and draped in sterile fashion. Ultrasound is utilized secondary to lack of appropriate landmarks and to avoid vascular injury. Under direct ultrasound visualization, access is obtained to the common femoral artery with a micropuncture needle, microwire followed by  micro sheath is inserted. The micro sheath is a stiffened micropuncture sheath. Amplatz superstiff wire is then advanced through the micro sheath and a 5 French sheath is advanced. 5 French pigtail catheter is then advanced over the wire and positioned at T12. Bolus injection of contrast is performed with the imager in the AP projection. Pigtail catheter is repositioned. LAO projection of the pelvis is obtained. Stiff angled Glidewire and pigtail catheter are then used to cross the aortic bifurcation and the catheter is advanced down into the superficial femoral artery. Hand injection of contrast is then used to show the femoral artery on the right in an oblique projection and subsequently the injector is used to create a distal runoff. This represents third order catheter placement. Magic torque wire and Glidewire combination is then used to negotiate the wire through the previously stented SFA and popliteal and into the peroneal across the lesions. Subtotal occlusions are noted in multiple locations. First a 5 x 20 [place] balloon is advanced down to the popliteal and serial angioplasty is performed to 16 to 18 atmospheres for one minute. Follow-up angioplasty is performed with a 6 x 20 [place] balloon. Following the inflation to 6 mm the SFA and popliteal are widely patent. Tibioperoneal trunk and peroneal demonstrate two tandem lesions which are greater than 90%. A 4 mm balloon is utilized to treat the tibioperoneal trunk and subsequently a 5 x 2 balloon is used to treat the dissection with excellent result. The peroneal proper is treated with a 3 and then a 3.5 mm balloon. Follow-up angiography demonstrates continuous flow from the common femoral to the ankle via the peroneal. There is reconstitution via large peroneal collateral of the lateral plantar which fills the foot.   Sheath is pulled into the left external iliac, oblique view was obtained and a StarClose  device deployed successfully.    INTERPRETATION: The abdominal aorta is opacified with a bolus injection of contrast. The aorta is free of hemodynamically significant stenosis. Bilateral common iliacs are noted. Bilateral external iliacs are also free of hemodynamically significant stenosis.   Right common femoral and profunda femoris are widely patent. Superficial femoral artery is noted for several stents already in place and there is diffuse disease throughout this with multiple areas of subtotal occlusion. There is a focal area in the tibioperoneal trunk as well as the proximal one-third of the peroneal artery. Posterior tibial and anterior tibial arteries are occluded in the proximal two thirds.   Following angioplasty as described above, there is now in-line flow with less than 10% residual stenosis and filling of the lateral plantar via the peroneal.   SUMMARY: Successful angioplasty of the SFA and popliteal to 6 mm, successful angioplasty of tibioperoneal trunk and distal popliteal to 5 mm.   Successful angioplasty of the peroneal artery to 3.5 mm.  ____________________________ Katha Cabal, MD ggs:cms D: 08/24/2011 14:52:10 ET T: 08/24/2011 15:17:40 ET JOB#: [id number]  cc: Katha Cabal, MD, <Dictator> Katha Cabal MD ELECTRONICALLY SIGNED 09/05/2011 11:24

## 2014-11-16 DIAGNOSIS — L400 Psoriasis vulgaris: Secondary | ICD-10-CM | POA: Diagnosis not present

## 2014-11-17 ENCOUNTER — Telehealth: Payer: Self-pay | Admitting: *Deleted

## 2014-11-17 ENCOUNTER — Ambulatory Visit (INDEPENDENT_AMBULATORY_CARE_PROVIDER_SITE_OTHER): Payer: Medicare Other | Admitting: Psychology

## 2014-11-17 DIAGNOSIS — F449 Dissociative and conversion disorder, unspecified: Secondary | ICD-10-CM

## 2014-11-17 DIAGNOSIS — F332 Major depressive disorder, recurrent severe without psychotic features: Secondary | ICD-10-CM

## 2014-11-17 NOTE — Telephone Encounter (Signed)
We will have to either provide a new note, or do this next visit.

## 2014-11-17 NOTE — Telephone Encounter (Signed)
[name] [name] from [employer] called, they received [name] [name]'s forms for his diabetic shoes that were faxed yesterday. States, however, there is no documentation noted on [name] [name]'s need of shoes. Something needs to be documented in his chart and faxed to [employer] for insurance to approve.

## 2014-11-18 DIAGNOSIS — L400 Psoriasis vulgaris: Secondary | ICD-10-CM | POA: Diagnosis not present

## 2014-11-23 ENCOUNTER — Encounter: Payer: Self-pay | Admitting: *Deleted

## 2014-11-23 DIAGNOSIS — L400 Psoriasis vulgaris: Secondary | ICD-10-CM | POA: Diagnosis not present

## 2014-11-24 ENCOUNTER — Ambulatory Visit (INDEPENDENT_AMBULATORY_CARE_PROVIDER_SITE_OTHER): Payer: Medicare Other | Admitting: Psychology

## 2014-11-24 DIAGNOSIS — F449 Dissociative and conversion disorder, unspecified: Secondary | ICD-10-CM

## 2014-11-24 DIAGNOSIS — F332 Major depressive disorder, recurrent severe without psychotic features: Secondary | ICD-10-CM

## 2014-11-30 DIAGNOSIS — L400 Psoriasis vulgaris: Secondary | ICD-10-CM | POA: Diagnosis not present

## 2014-12-01 ENCOUNTER — Ambulatory Visit (INDEPENDENT_AMBULATORY_CARE_PROVIDER_SITE_OTHER): Payer: Medicare Other | Admitting: Psychology

## 2014-12-01 DIAGNOSIS — F332 Major depressive disorder, recurrent severe without psychotic features: Secondary | ICD-10-CM | POA: Diagnosis not present

## 2014-12-01 DIAGNOSIS — F449 Dissociative and conversion disorder, unspecified: Secondary | ICD-10-CM

## 2014-12-02 DIAGNOSIS — L400 Psoriasis vulgaris: Secondary | ICD-10-CM | POA: Diagnosis not present

## 2014-12-07 DIAGNOSIS — L400 Psoriasis vulgaris: Secondary | ICD-10-CM | POA: Diagnosis not present

## 2014-12-08 ENCOUNTER — Ambulatory Visit (INDEPENDENT_AMBULATORY_CARE_PROVIDER_SITE_OTHER): Payer: Medicare Other | Admitting: Psychology

## 2014-12-08 DIAGNOSIS — F332 Major depressive disorder, recurrent severe without psychotic features: Secondary | ICD-10-CM | POA: Diagnosis not present

## 2014-12-08 DIAGNOSIS — F449 Dissociative and conversion disorder, unspecified: Secondary | ICD-10-CM

## 2014-12-09 DIAGNOSIS — R3912 Poor urinary stream: Secondary | ICD-10-CM | POA: Diagnosis not present

## 2014-12-10 ENCOUNTER — Ambulatory Visit (INDEPENDENT_AMBULATORY_CARE_PROVIDER_SITE_OTHER): Payer: Medicare Other | Admitting: Internal Medicine

## 2014-12-10 ENCOUNTER — Encounter: Payer: Self-pay | Admitting: Internal Medicine

## 2014-12-10 VITALS — BP 123/74 | HR 71 | Temp 97.8°F | Ht 66.0 in | Wt 287.4 lb

## 2014-12-10 DIAGNOSIS — E1151 Type 2 diabetes mellitus with diabetic peripheral angiopathy without gangrene: Secondary | ICD-10-CM

## 2014-12-10 DIAGNOSIS — I10 Essential (primary) hypertension: Secondary | ICD-10-CM | POA: Diagnosis not present

## 2014-12-10 DIAGNOSIS — K5900 Constipation, unspecified: Secondary | ICD-10-CM

## 2014-12-10 DIAGNOSIS — E1159 Type 2 diabetes mellitus with other circulatory complications: Secondary | ICD-10-CM

## 2014-12-10 DIAGNOSIS — I25119 Atherosclerotic heart disease of native coronary artery with unspecified angina pectoris: Secondary | ICD-10-CM

## 2014-12-10 DIAGNOSIS — E785 Hyperlipidemia, unspecified: Secondary | ICD-10-CM | POA: Diagnosis not present

## 2014-12-10 DIAGNOSIS — F419 Anxiety disorder, unspecified: Secondary | ICD-10-CM

## 2014-12-10 LAB — COMPREHENSIVE METABOLIC PANEL
ALT: 18 U/L (ref 0–53)
AST: 16 U/L (ref 0–37)
Albumin: 3.6 g/dL (ref 3.5–5.2)
Alkaline Phosphatase: 125 U/L — ABNORMAL HIGH (ref 39–117)
BUN: 20 mg/dL (ref 6–23)
CO2: 26 mEq/L (ref 19–32)
Calcium: 8.8 mg/dL (ref 8.4–10.5)
Chloride: 102 mEq/L (ref 96–112)
Creatinine, Ser: 1.61 mg/dL — ABNORMAL HIGH (ref 0.40–1.50)
GFR: 46.57 mL/min — ABNORMAL LOW (ref >60.00)
Glucose, Bld: 142 mg/dL — ABNORMAL HIGH (ref 70–99)
Potassium: 4.7 mEq/L (ref 3.5–5.1)
Sodium: 136 mEq/L (ref 135–145)
Total Bilirubin: 0.4 mg/dL (ref 0.2–1.2)
Total Protein: 7.7 g/dL (ref 6.0–8.3)

## 2014-12-10 LAB — LIPID PANEL
Cholesterol: 92 mg/dL (ref 0–200)
HDL: 24.6 mg/dL — ABNORMAL LOW (ref >39.00)
LDL Cholesterol: 42 mg/dL (ref 0–99)
NonHDL: 67.4
Total CHOL/HDL Ratio: 4
Triglycerides: 126 mg/dL (ref 0.0–149.0)
VLDL: 25.2 mg/dL (ref 0.0–40.0)

## 2014-12-10 LAB — HEMOGLOBIN A1C: Hgb A1c MFr Bld: 7.4 % — ABNORMAL HIGH (ref 4.6–6.5)

## 2014-12-10 MED ORDER — SIMVASTATIN 20 MG PO TABS: 20.0000 mg | ORAL_TABLET | Freq: Every day | ORAL | Status: DC

## 2014-12-10 NOTE — Progress Notes (Signed)
Subjective:    Patient ID: [name] [name], male    DOB: 09-06-1955, 59 y.o.   MRN: [id number]  HPI  59YO male presents for follow up.  DM - BG have been running high recently, over 200-300. Compliant with medications. Reports compliance with diet. Taking NPH insulin 70units twice daily.  Notes abdominal bloating from chronic constipation. Urologist gave recommendations for regimen with Miralax prep.  Wt Readings from Last 3 Encounters:  12/10/14 287 lb 6 oz (130.352 kg)  11/05/14 286 lb 2 oz (129.785 kg)  10/08/14 277 lb 9.6 oz (125.919 kg)   Also notes increased anxiety recently with houseguests. Continues in counseling and is compliant with Sertraline and Wellbutrin.  Past medical, surgical, family and social history per today's encounter.  Review of Systems  Constitutional: Negative for fever, chills, activity change, appetite change, fatigue and unexpected weight change.  Eyes: Negative for visual disturbance.  Respiratory: Negative for cough and shortness of breath.   Cardiovascular: Negative for chest pain, palpitations and leg swelling.  Gastrointestinal: Positive for constipation and abdominal distention. Negative for nausea, vomiting, abdominal pain and diarrhea.  Genitourinary: Negative for dysuria, urgency and difficulty urinating.  Musculoskeletal: Positive for arthralgias. Negative for gait problem.  Skin: Negative for color change and rash.  Hematological: Negative for adenopathy.  Psychiatric/Behavioral: Positive for dysphoric mood. Negative for suicidal ideas and sleep disturbance. The patient is nervous/anxious.        Objective:    BP 123/74 mmHg  Pulse 71  Temp(Src) 97.8 F (36.6 C) (Oral)  Ht 5\' 6"  (1.676 m)  Wt 287 lb 6 oz (130.352 kg)  BMI 46.41 kg/m2  SpO2 96% Physical Exam  Constitutional: He is oriented to person, place, and time. He appears well-developed and well-nourished. No distress.  HENT:  Head: Normocephalic and atraumatic.  Right  Ear: External ear normal.  Left Ear: External ear normal.  Nose: Nose normal.  Mouth/Throat: Oropharynx is clear and moist. No oropharyngeal exudate.  Eyes: Conjunctivae and EOM are normal. Pupils are equal, round, and reactive to light. Right eye exhibits no discharge. Left eye exhibits no discharge. No scleral icterus.  Neck: Normal range of motion. Neck supple. No tracheal deviation present. No thyromegaly present.  Cardiovascular: Normal rate and regular rhythm.  Exam reveals no gallop and no friction rub.   Murmur heard. Pulmonary/Chest: Effort normal and breath sounds normal. No accessory muscle usage. No tachypnea. No respiratory distress. He has no decreased breath sounds. He has no wheezes. He has no rhonchi. He has no rales. He exhibits no tenderness.  Musculoskeletal: Normal range of motion. He exhibits no edema.  Lymphadenopathy:    He has no cervical adenopathy.  Neurological: He is alert and oriented to person, place, and time. No cranial nerve deficit. Coordination normal.  Skin: Skin is warm and dry. No rash noted. He is not diaphoretic. No erythema. No pallor.  Psychiatric: He has a normal mood and affect. His behavior is normal. Judgment and thought content normal.          Assessment & Plan:   Problem List Items Addressed This Visit      Unprioritized   Anxiety    Recent worsening anxiety. On Sertraline and Wellbutrin. Encouraged him to set some limits at home. Will set up evaluation with Dr. Nicolasa Ducking.      Relevant Orders   Ambulatory referral to Psychiatry   Constipation    Reviewed notes from Urology. Recommended Miralax prep to help with constipation. Will follow after  complete to see if any improvement.      Hyperlipidemia    Will check lipids with labs. Continue Simvastatin.      Relevant Medications   simvastatin (ZOCOR) 20 MG tablet   Hypertension    BP Readings from Last 3 Encounters:  12/10/14 123/74  11/05/14 134/76  10/08/14 124/72   BP well  controlled. Continue current medications. Renal function with labs.      Relevant Medications   simvastatin (ZOCOR) 20 MG tablet   Type 2 diabetes mellitus with vascular disease - Primary    BG elevated per pt report. Will check A1c with labs. Encouraged continued compliance with diet and insulin. He will follow up with Dr. Cruzita Lederer as scheduled.      Relevant Medications   simvastatin (ZOCOR) 20 MG tablet   Other Relevant Orders   Comprehensive metabolic panel   Hemoglobin A1c   Lipid panel   Microalbumin / creatinine urine ratio       Return in about 3 months (around 03/12/2015) for Recheck.

## 2014-12-10 NOTE — Patient Instructions (Addendum)
Labs today.  We will set up evaluation with Dr. Nicolasa Ducking in psychiatry.

## 2014-12-10 NOTE — Assessment & Plan Note (Signed)
Reviewed notes from Urology. Recommended Miralax prep to help with constipation. Will follow after complete to see if any improvement.

## 2014-12-10 NOTE — Progress Notes (Signed)
Pre visit review using our clinic review tool, if applicable. No additional management support is needed unless otherwise documented below in the visit note. 

## 2014-12-10 NOTE — Assessment & Plan Note (Signed)
BP Readings from Last 3 Encounters:  12/10/14 123/74  11/05/14 134/76  10/08/14 124/72   BP well controlled. Continue current medications. Renal function with labs.

## 2014-12-10 NOTE — Assessment & Plan Note (Signed)
Recent worsening anxiety. On Sertraline and Wellbutrin. Encouraged him to set some limits at home. Will set up evaluation with Dr. Nicolasa Ducking.

## 2014-12-10 NOTE — Assessment & Plan Note (Signed)
Will check lipids with labs. Continue Simvastatin. 

## 2014-12-10 NOTE — Assessment & Plan Note (Signed)
BG elevated per pt report. Will check A1c with labs. Encouraged continued compliance with diet and insulin. He will follow up with Dr. Cruzita Lederer as scheduled.

## 2014-12-15 ENCOUNTER — Ambulatory Visit: Payer: Medicare Other | Admitting: Psychology

## 2014-12-16 DIAGNOSIS — L400 Psoriasis vulgaris: Secondary | ICD-10-CM | POA: Diagnosis not present

## 2014-12-21 ENCOUNTER — Encounter (HOSPITAL_COMMUNITY): Payer: Self-pay | Admitting: Emergency Medicine

## 2014-12-21 ENCOUNTER — Emergency Department (HOSPITAL_COMMUNITY): Payer: Medicare Other

## 2014-12-21 ENCOUNTER — Emergency Department (HOSPITAL_COMMUNITY)
Admission: EM | Admit: 2014-12-21 | Discharge: 2014-12-21 | Disposition: A | Discharge status: Home / Self Care | Payer: Medicare Other | Attending: Emergency Medicine | Admitting: Emergency Medicine

## 2014-12-21 ENCOUNTER — Ambulatory Visit: Payer: Self-pay | Admitting: Internal Medicine

## 2014-12-21 DIAGNOSIS — Z8673 Personal history of transient ischemic attack (TIA), and cerebral infarction without residual deficits: Secondary | ICD-10-CM | POA: Insufficient documentation

## 2014-12-21 DIAGNOSIS — F329 Major depressive disorder, single episode, unspecified: Secondary | ICD-10-CM | POA: Insufficient documentation

## 2014-12-21 DIAGNOSIS — S80212A Abrasion, left knee, initial encounter: Secondary | ICD-10-CM | POA: Insufficient documentation

## 2014-12-21 DIAGNOSIS — F419 Anxiety disorder, unspecified: Secondary | ICD-10-CM | POA: Insufficient documentation

## 2014-12-21 DIAGNOSIS — Z8719 Personal history of other diseases of the digestive system: Secondary | ICD-10-CM | POA: Diagnosis not present

## 2014-12-21 DIAGNOSIS — Z8619 Personal history of other infectious and parasitic diseases: Secondary | ICD-10-CM | POA: Diagnosis not present

## 2014-12-21 DIAGNOSIS — Z720 Tobacco use: Secondary | ICD-10-CM | POA: Diagnosis not present

## 2014-12-21 DIAGNOSIS — S50812A Abrasion of left forearm, initial encounter: Secondary | ICD-10-CM | POA: Insufficient documentation

## 2014-12-21 DIAGNOSIS — Z880 Allergy status to penicillin: Secondary | ICD-10-CM | POA: Insufficient documentation

## 2014-12-21 DIAGNOSIS — Z8669 Personal history of other diseases of the nervous system and sense organs: Secondary | ICD-10-CM | POA: Insufficient documentation

## 2014-12-21 DIAGNOSIS — Z794 Long term (current) use of insulin: Secondary | ICD-10-CM | POA: Insufficient documentation

## 2014-12-21 DIAGNOSIS — Z9889 Other specified postprocedural states: Secondary | ICD-10-CM | POA: Diagnosis not present

## 2014-12-21 DIAGNOSIS — E785 Hyperlipidemia, unspecified: Secondary | ICD-10-CM | POA: Diagnosis not present

## 2014-12-21 DIAGNOSIS — Z951 Presence of aortocoronary bypass graft: Secondary | ICD-10-CM | POA: Insufficient documentation

## 2014-12-21 DIAGNOSIS — S8992XA Unspecified injury of left lower leg, initial encounter: Secondary | ICD-10-CM | POA: Diagnosis not present

## 2014-12-21 DIAGNOSIS — Z86718 Personal history of other venous thrombosis and embolism: Secondary | ICD-10-CM | POA: Insufficient documentation

## 2014-12-21 DIAGNOSIS — Z7982 Long term (current) use of aspirin: Secondary | ICD-10-CM | POA: Insufficient documentation

## 2014-12-21 DIAGNOSIS — M25562 Pain in left knee: Secondary | ICD-10-CM

## 2014-12-21 DIAGNOSIS — W19XXXA Unspecified fall, initial encounter: Secondary | ICD-10-CM

## 2014-12-21 DIAGNOSIS — Z86018 Personal history of other benign neoplasm: Secondary | ICD-10-CM | POA: Insufficient documentation

## 2014-12-21 DIAGNOSIS — R29810 Facial weakness: Secondary | ICD-10-CM | POA: Insufficient documentation

## 2014-12-21 DIAGNOSIS — S0990XA Unspecified injury of head, initial encounter: Secondary | ICD-10-CM | POA: Diagnosis not present

## 2014-12-21 DIAGNOSIS — M7989 Other specified soft tissue disorders: Secondary | ICD-10-CM | POA: Diagnosis not present

## 2014-12-21 DIAGNOSIS — I25119 Atherosclerotic heart disease of native coronary artery with unspecified angina pectoris: Secondary | ICD-10-CM | POA: Diagnosis not present

## 2014-12-21 DIAGNOSIS — E669 Obesity, unspecified: Secondary | ICD-10-CM | POA: Insufficient documentation

## 2014-12-21 DIAGNOSIS — Y9289 Other specified places as the place of occurrence of the external cause: Secondary | ICD-10-CM | POA: Diagnosis not present

## 2014-12-21 DIAGNOSIS — Z79899 Other long term (current) drug therapy: Secondary | ICD-10-CM | POA: Diagnosis not present

## 2014-12-21 DIAGNOSIS — W108XXA Fall (on) (from) other stairs and steps, initial encounter: Secondary | ICD-10-CM | POA: Insufficient documentation

## 2014-12-21 DIAGNOSIS — Y998 Other external cause status: Secondary | ICD-10-CM | POA: Insufficient documentation

## 2014-12-21 DIAGNOSIS — I739 Peripheral vascular disease, unspecified: Secondary | ICD-10-CM | POA: Diagnosis not present

## 2014-12-21 DIAGNOSIS — I10 Essential (primary) hypertension: Secondary | ICD-10-CM | POA: Diagnosis not present

## 2014-12-21 DIAGNOSIS — Z872 Personal history of diseases of the skin and subcutaneous tissue: Secondary | ICD-10-CM | POA: Insufficient documentation

## 2014-12-21 DIAGNOSIS — S80211A Abrasion, right knee, initial encounter: Secondary | ICD-10-CM | POA: Diagnosis not present

## 2014-12-21 DIAGNOSIS — Z86010 Personal history of colonic polyps: Secondary | ICD-10-CM | POA: Diagnosis not present

## 2014-12-21 DIAGNOSIS — E119 Type 2 diabetes mellitus without complications: Secondary | ICD-10-CM | POA: Insufficient documentation

## 2014-12-21 DIAGNOSIS — M1990 Unspecified osteoarthritis, unspecified site: Secondary | ICD-10-CM | POA: Diagnosis not present

## 2014-12-21 DIAGNOSIS — R011 Cardiac murmur, unspecified: Secondary | ICD-10-CM | POA: Insufficient documentation

## 2014-12-21 DIAGNOSIS — Y9389 Activity, other specified: Secondary | ICD-10-CM | POA: Diagnosis not present

## 2014-12-21 LAB — URINE MICROSCOPIC-ADD ON

## 2014-12-21 LAB — CBC WITH DIFFERENTIAL/PLATELET
Basophils Absolute: 0 10*3/uL (ref 0.0–0.1)
Basophils Relative: 0 % (ref 0–1)
Eosinophils Absolute: 0.3 10*3/uL (ref 0.0–0.7)
Eosinophils Relative: 3 % (ref 0–5)
HCT: 39.3 % (ref 39.0–52.0)
Hemoglobin: 12.8 g/dL — ABNORMAL LOW (ref 13.0–17.0)
Lymphocytes Relative: 13 % (ref 12–46)
Lymphs Abs: 1.3 10*3/uL (ref 0.7–4.0)
MCH: 28.6 pg (ref 26.0–34.0)
MCHC: 32.6 g/dL (ref 30.0–36.0)
MCV: 87.9 fL (ref 78.0–100.0)
Monocytes Absolute: 0.6 10*3/uL (ref 0.1–1.0)
Monocytes Relative: 6 % (ref 3–12)
Neutro Abs: 7.4 10*3/uL (ref 1.7–7.7)
Neutrophils Relative %: 78 % — ABNORMAL HIGH (ref 43–77)
Platelets: 236 10*3/uL (ref 150–400)
RBC: 4.47 MIL/uL (ref 4.22–5.81)
RDW: 15.7 % — ABNORMAL HIGH (ref 11.5–15.5)
WBC: 9.6 10*3/uL (ref 4.0–10.5)

## 2014-12-21 LAB — BASIC METABOLIC PANEL
Anion gap: 10 (ref 5–15)
BUN: 17 mg/dL (ref 6–20)
CO2: 26 mmol/L (ref 22–32)
Calcium: 9.7 mg/dL (ref 8.9–10.3)
Chloride: 102 mmol/L (ref 101–111)
Creatinine, Ser: 1.58 mg/dL — ABNORMAL HIGH (ref 0.61–1.24)
GFR calc Af Amer: 53 mL/min — ABNORMAL LOW (ref >60)
GFR calc non Af Amer: 46 mL/min — ABNORMAL LOW (ref >60)
Glucose, Bld: 198 mg/dL — ABNORMAL HIGH (ref 65–99)
Potassium: 4.6 mmol/L (ref 3.5–5.1)
Sodium: 138 mmol/L (ref 135–145)

## 2014-12-21 LAB — I-STAT TROPONIN, ED: Troponin i, poc: 0.02 ng/mL (ref 0.00–0.08)

## 2014-12-21 LAB — URINALYSIS, ROUTINE W REFLEX MICROSCOPIC
Bilirubin Urine: NEGATIVE
Glucose, UA: NEGATIVE mg/dL
Hgb urine dipstick: NEGATIVE
Ketones, ur: NEGATIVE mg/dL
Leukocytes, UA: NEGATIVE
Nitrite: NEGATIVE
Protein, ur: 30 mg/dL — AB
Specific Gravity, Urine: 1.015 (ref 1.005–1.030)
Urobilinogen, UA: 0.2 mg/dL (ref 0.0–1.0)
pH: 5 (ref 5.0–8.0)

## 2014-12-21 NOTE — ED Notes (Signed)
Pt sitting on stool at computer. Informed pt that it is safer for him to sit in the bed, and he sts that he is more comfortable on the stool. Junie Panning, RN notified.

## 2014-12-21 NOTE — ED Notes (Addendum)
Pt fell down steps at the 3rd step from the bottom. Pt denies hitting head, pt c/o bilateral knee pain. Pt has hx of neuropathy. Pt pain 10/10.  Pt noted to have abrasions to bilateral knees.

## 2014-12-21 NOTE — Discharge Instructions (Signed)

## 2014-12-21 NOTE — ED Provider Notes (Signed)
CSN: [id number]     Arrival date & time 12/21/14  1000 History   First MD Initiated Contact with Patient 12/21/14 1037     Chief Complaint  Patient presents with  . Fall     (Consider location/radiation/quality/duration/timing/severity/associated sxs/prior Treatment) HPI 59 y.o. male with a history of psoriasis, diabetes, chronic bilateral peripheral neuropathy of the legs and recurrent falls presents today after a fall down approximately 3 steps, landing on his knees and left side. He denies hit to head, antecedent symptoms, GI symptoms. No headache, nausea, vomiting, vision change. He states that he slipped on some loose stones precipitating the fall. He was unable to rise after falling. He denies any localized weakness, states that he was generally weak. Timing of symptoms was constant. Nothing exacerbated or alleviated his symptoms. Location was generalized. Past Medical History  Diagnosis Date  . Peripheral vascular disease   . Diabetes mellitus   . Mild aortic stenosis   . Tobacco use disorder     recently quit  . Depression   . Psoriasis   . Neuropathy   . Obesity   . Post splenectomy syndrome   . SOB (shortness of breath)   . Carotid artery occlusion   . Coronary artery disease   . Hyperlipidemia   . Hypertension   . Bell's palsy 2007  . Sleep apnea   . Angina at rest     chronic  . Cataract     Dr. Dawna Part  . Anxiety   . Arthritis   . DVT (deep venous thrombosis)     in leg  . Stroke   . Allergy   . Heart murmur   . Tubular adenoma 08/2013    Dr. Hilarie Fredrickson  . Adenomatous colon polyp   . Gastropathy   . Duodenitis   . Helicobacter pylori gastritis   . Diverticulosis    Past Surgical History  Procedure Laterality Date  . Splenectomy    . Heart stents  Jan 2011    leg stents 06/2009 and 03/2010  . Coronary artery bypass graft  09/06/2010    At [facility]: LIMA to LAD, left radial to RCA, sequential SVG to OM3 and 4  . Carotid endarterectomy  08/2010    left/ Dr.  Kellie Simmering  . Pta of illiac and sfa  multiple    Dr. Ronalee Belts, s/p revision 11/2012  . Colonoscopy    . Cardiac catheterization  08/2010    LAD: 80% ISR, RCA: 80% ostial, OM 80-90%  . Coronary angioplasty      LAD: before CABG   Family History  Problem Relation Age of Onset  . Lung cancer Father 70  . Breast cancer Sister 63  . Diabetes Paternal Grandfather   . Colon polyps Paternal Grandfather 45  . Dementia Mother   . Alzheimer's disease Mother    History  Substance Use Topics  . Smoking status: Current Some Day Smoker -- 0.30 packs/day for 40 years    Types: Cigarettes  . Smokeless tobacco: Never Used     Comment: smokes 2-3 cigarettes every 3 weeks  . Alcohol Use: No    Review of Systems  All other systems reviewed and are negative.     Allergies  Glipizide; Metrizamide; Penicillins; and Contrast media  Home Medications   Prior to Admission medications   Medication Sig Start Date End Date Taking? Authorizing Provider  albuterol (PROVENTIL HFA;VENTOLIN HFA) 108 (90 BASE) MCG/ACT inhaler Inhale 2 puffs into the lungs every 4 (four) hours as needed  for wheezing or shortness of breath. 09/17/14  Yes Minna Merritts, MD  amitriptyline (ELAVIL) 50 MG tablet Take 50 mg by mouth at bedtime.   Yes Historical Provider, MD  aspirin 81 MG tablet Take 81 mg by mouth daily.   Yes Historical Provider, MD  BAYER MICROLET LANCETS lancets use as directed three times a day   Yes Jackolyn Confer, MD  buPROPion (WELLBUTRIN XL) 150 MG 24 hr tablet take 1 tablet by mouth once daily 08/19/14  Yes Jackolyn Confer, MD  cholecalciferol (VITAMIN D) 1000 UNITS tablet Take 1,000 Units by mouth daily.     Yes Historical Provider, MD  Cinnamon 500 MG capsule Take 1,000 mg by mouth daily.   Yes Historical Provider, MD  clopidogrel (PLAVIX) 75 MG tablet Take 1 tablet (75 mg total) by mouth daily. 01/13/14  Yes Wellington Hampshire, MD  cyclobenzaprine (FLEXERIL) 10 MG tablet Take 10 mg by mouth 2 (two)  times daily as needed.  05/17/11  Yes Jackolyn Confer, MD  diazepam (VALIUM) 5 MG tablet Take 1-2 tablets (5-10 mg total) by mouth at bedtime as needed for anxiety. 09/02/14  Yes Jackolyn Confer, MD  Glucosamine-Chondroit-Vit C-Mn (GLUCOSAMINE CHONDR 500 COMPLEX) CAPS Take 1,000 each by mouth daily.   Yes Historical Provider, MD  glucose blood (BAYER CONTOUR TEST) test strip Use to test blood sugar three times daily as instructed.  Dx code: 250.70 11/12/13  Yes Philemon Kingdom, MD  insulin NPH Human (HUMULIN N,NOVOLIN N) 100 UNIT/ML injection Inject under skin 70 units daily: in am and before dinner 07/09/14  Yes Philemon Kingdom, MD  Insulin Pen Needle (NOVOFINE) 32G X 6 MM MISC Use three times daily 11/03/13  Yes Jackolyn Confer, MD  insulin regular (NOVOLIN R,HUMULIN R) 100 units/mL injection Inject 110 units daily as avised Patient taking differently: Inject 25-45 Units into the skin 3 (three) times daily before meals. 40 units in the morning, 25 units at lunch, 45 units at dinner 04/16/14  Yes Philemon Kingdom, MD  Insulin Syringe-Needle U-100 (RELION INSULIN SYRINGE 1ML/31G) 31G X 5/16" 1 ML MISC Use 3x a day 01/28/14  Yes Philemon Kingdom, MD  Lactulose 20 GM/30ML SOLN Take 30 mLs by mouth every 2 (two) hours as needed. 03/15/11  Yes Jackolyn Confer, MD  Lancet Device MISC 1 application by Does not apply route as needed. 11/04/12  Yes Jackolyn Confer, MD  losartan (COZAAR) 25 MG tablet take 1 tablet by mouth once daily 06/02/14  Yes Jackolyn Confer, MD  magnesium gluconate (MAGONATE) 500 MG tablet Take 1,000 mg by mouth daily.   Yes Historical Provider, MD  metoprolol succinate (TOPROL-XL) 50 MG 24 hr tablet take 1 tablet by mouth twice a day 07/05/14  Yes Jackolyn Confer, MD  Multiple Vitamin (MULTIVITAMIN) capsule Take 1 capsule by mouth daily.     Yes Historical Provider, MD  nitazoxanide (ALINIA) 500 MG tablet Take 1 tablet (500 mg total) by mouth 2 (two) times daily with a meal.  07/02/13  Yes Jackolyn Confer, MD  Omega-3 Fatty Acids (FISH OIL PO) Take by mouth. 01/10/10  Yes Historical Provider, MD  polyethylene glycol (MIRALAX) packet Take 17 g by mouth daily. 10/30/13  Yes Jerene Bears, MD  sertraline (ZOLOFT) 100 MG tablet Take 1 tablet (100 mg total) by mouth daily. 09/02/14  Yes Jackolyn Confer, MD  simvastatin (ZOCOR) 20 MG tablet Take 1 tablet (20 mg total) by mouth daily at 6  PM. 12/10/14  Yes Jackolyn Confer, MD  tamsulosin [facility]) 0.4 MG CAPS capsule take 1 capsule by mouth every morning 07/09/14  Yes Jackolyn Confer, MD  gentamicin ointment (GARAMYCIN) 0.1 % Apply 1 application topically 3 (three) times daily. 11/23/13   Jackolyn Confer, MD  HYDROcodone-acetaminophen (NORCO/VICODIN) 5-325 MG per tablet Take 1-2 tablets by mouth every 6 (six) hours as needed for moderate pain or severe pain. 09/02/14   Jackolyn Confer, MD  nitroGLYCERIN (NITROSTAT) 0.4 MG SL tablet Place 1 tablet (0.4 mg total) under the tongue every 5 (five) minutes as needed. 06/24/12   Wellington Hampshire, MD  omeprazole (PRILOSEC) 20 MG capsule Take 1 capsule (20 mg total) by mouth 2 (two) times daily. 09/24/13   Jerene Bears, MD   BP 136/77 mmHg  Pulse 83  Temp(Src) 97.5 F (36.4 C) (Oral)  Resp 18  SpO2 99% Physical Exam  Constitutional: He is oriented to person, place, and time. He appears well-developed and well-nourished.  HENT:  Head: Normocephalic and atraumatic.  Eyes: Conjunctivae and EOM are normal.  Neck: Normal range of motion. Neck supple.  Cardiovascular: Normal rate, regular rhythm and normal heart sounds.   Pulmonary/Chest: Effort normal and breath sounds normal. No respiratory distress.  Abdominal: He exhibits no distension. There is no tenderness. There is no rebound and no guarding.  Musculoskeletal: Normal range of motion.  Abrasion over L forearm psoriasis plaque, bil abrasions over knees, L knee mild tenderness, FROM  Neurological: He is alert and oriented to  person, place, and time. He has normal reflexes. A cranial nerve deficit (L sided facial droop involving forehead) is present. No sensory deficit. He exhibits normal muscle tone. Gait (slow (baseline)) abnormal.  Skin: Skin is warm and dry.  Vitals reviewed.   ED Course  Procedures (including critical care time) Labs Review Labs Reviewed  CBC WITH DIFFERENTIAL/PLATELET - Abnormal; Notable for the following:    Hemoglobin 12.8 (*)    RDW 15.7 (*)    Neutrophils Relative % 78 (*)    All other components within normal limits  BASIC METABOLIC PANEL - Abnormal; Notable for the following:    Glucose, Bld 198 (*)    Creatinine, Ser 1.58 (*)    GFR calc non Af Amer 46 (*)    GFR calc Af Amer 53 (*)    All other components within normal limits  URINALYSIS, ROUTINE W REFLEX MICROSCOPIC (NOT AT [facility]) - Abnormal; Notable for the following:    Protein, ur 30 (*)    All other components within normal limits  URINE MICROSCOPIC-ADD ON  Randolm Idol, ED    Imaging Review Ct Head Wo Contrast  12/21/2014   CLINICAL DATA:  Fall down steps.  EXAM: CT HEAD WITHOUT CONTRAST  TECHNIQUE: Contiguous axial images were obtained from the base of the skull through the vertex without intravenous contrast.  COMPARISON:  None.  FINDINGS: Bony calvarium appears intact. Mild diffuse cortical atrophy is noted. Minimal chronic ischemic white matter disease is noted. No mass effect or midline shift is noted. Ventricular size is within normal limits. There is no evidence of mass lesion, hemorrhage or acute infarction.  IMPRESSION: Mild diffuse cortical atrophy. Minimal chronic ischemic white matter disease. No acute intracranial abnormality seen.   Electronically Signed   By: Marijo Conception, M.D.   On: 12/21/2014 13:17   Dg Knee Complete 4 Views Left  12/21/2014   CLINICAL DATA:  Left knee pain and swelling after fall  down steps. Initial encounter.  EXAM: LEFT KNEE - COMPLETE 4+ VIEW  COMPARISON:  None.  FINDINGS:  There is no evidence of fracture, dislocation, or joint effusion. Degenerative change of the patellofemoral space is noted. Vascular stent is seen in the popliteal region.  IMPRESSION: Mild degenerative joint disease of patellofemoral space. No acute abnormality seen in the left knee.   Electronically Signed   By: Marijo Conception, M.D.   On: 12/21/2014 13:09     EKG Interpretation   Date/Time:  Tuesday Dec 21 2014 10:20:41 EDT Ventricular Rate:  75 PR Interval:  185 QRS Duration: 115 QT Interval:  412 QTC Calculation: 460 R Axis:   -17 Text Interpretation:  Sinus rhythm Nonspecific intraventricular conduction  delay Low voltage, precordial leads Consider anterior infarct No  significant change since last tracing Confirmed by Debby Freiberg [phone])  on 12/21/2014 2:31:57 PM      MDM   Final diagnoses:  Left knee pain  Fall, initial encounter    59 y.o. male with pertinent PMH of peripheral neuropathy, chronic gait instability, Bell's palsy, diabetes presents with recurrent fall with generalized weakness. On arrival vital signs physical exam as above. Patient has no focal neurologic deficits with the exception of Bell's palsy of his left face.  He states he feels at his baseline at this time.     Wu as above, unremarkable.  Pt had generalized symptoms, so doubt TIA, and no signs of active infection at this time.  DC home in stable condition per his request.    I have reviewed all laboratory and imaging studies if ordered as above  1. Fall, initial encounter   2. Left knee pain         Debby Freiberg, MD 12/21/14 [phone]

## 2014-12-21 NOTE — ED Notes (Signed)
Pt given urinal for urine sample 

## 2014-12-23 DIAGNOSIS — L400 Psoriasis vulgaris: Secondary | ICD-10-CM | POA: Diagnosis not present

## 2014-12-27 ENCOUNTER — Other Ambulatory Visit: Payer: Self-pay | Admitting: *Deleted

## 2014-12-27 DIAGNOSIS — Z0000 Encounter for general adult medical examination without abnormal findings: Secondary | ICD-10-CM

## 2014-12-28 ENCOUNTER — Other Ambulatory Visit: Payer: Self-pay | Admitting: *Deleted

## 2014-12-28 DIAGNOSIS — Z0000 Encounter for general adult medical examination without abnormal findings: Secondary | ICD-10-CM

## 2014-12-28 LAB — MICROALBUMIN / CREATININE URINE RATIO
Creatinine,U: 81.9 mg/dL
Microalb Creat Ratio: 13.1 mg/g (ref 0.0–30.0)
Microalb, Ur: 10.7 mg/dL — ABNORMAL HIGH (ref 0.0–1.9)

## 2014-12-30 ENCOUNTER — Telehealth: Payer: Self-pay | Admitting: *Deleted

## 2014-12-30 NOTE — Telephone Encounter (Signed)
Do you mean urine microalbumin? I don't know how to release it, but it will automatically release at 4 days

## 2014-12-30 NOTE — Telephone Encounter (Signed)
Pt called requesting his results from the UA from 6.7.16 be released to his [facility].  Please advise

## 2015-01-04 DIAGNOSIS — L400 Psoriasis vulgaris: Secondary | ICD-10-CM | POA: Diagnosis not present

## 2015-01-05 ENCOUNTER — Ambulatory Visit (INDEPENDENT_AMBULATORY_CARE_PROVIDER_SITE_OTHER): Payer: Medicare Other | Admitting: Psychology

## 2015-01-05 DIAGNOSIS — F449 Dissociative and conversion disorder, unspecified: Secondary | ICD-10-CM

## 2015-01-05 DIAGNOSIS — F332 Major depressive disorder, recurrent severe without psychotic features: Secondary | ICD-10-CM | POA: Diagnosis not present

## 2015-01-06 DIAGNOSIS — L400 Psoriasis vulgaris: Secondary | ICD-10-CM | POA: Diagnosis not present

## 2015-01-10 DIAGNOSIS — L400 Psoriasis vulgaris: Secondary | ICD-10-CM | POA: Diagnosis not present

## 2015-01-12 ENCOUNTER — Ambulatory Visit (INDEPENDENT_AMBULATORY_CARE_PROVIDER_SITE_OTHER): Payer: Medicare Other | Admitting: Psychology

## 2015-01-12 DIAGNOSIS — F449 Dissociative and conversion disorder, unspecified: Secondary | ICD-10-CM | POA: Diagnosis not present

## 2015-01-12 DIAGNOSIS — F332 Major depressive disorder, recurrent severe without psychotic features: Secondary | ICD-10-CM

## 2015-01-17 DIAGNOSIS — E1122 Type 2 diabetes mellitus with diabetic chronic kidney disease: Secondary | ICD-10-CM | POA: Diagnosis not present

## 2015-01-17 DIAGNOSIS — D631 Anemia in chronic kidney disease: Secondary | ICD-10-CM | POA: Diagnosis not present

## 2015-01-17 DIAGNOSIS — I10 Essential (primary) hypertension: Secondary | ICD-10-CM | POA: Diagnosis not present

## 2015-01-17 DIAGNOSIS — N183 Chronic kidney disease, stage 3 (moderate): Secondary | ICD-10-CM | POA: Diagnosis not present

## 2015-01-17 LAB — HM DIABETES EYE EXAM

## 2015-01-18 DIAGNOSIS — E1142 Type 2 diabetes mellitus with diabetic polyneuropathy: Secondary | ICD-10-CM | POA: Diagnosis not present

## 2015-01-18 DIAGNOSIS — B351 Tinea unguium: Secondary | ICD-10-CM | POA: Diagnosis not present

## 2015-01-19 ENCOUNTER — Ambulatory Visit (INDEPENDENT_AMBULATORY_CARE_PROVIDER_SITE_OTHER): Payer: Medicare Other | Admitting: Psychology

## 2015-01-19 DIAGNOSIS — F332 Major depressive disorder, recurrent severe without psychotic features: Secondary | ICD-10-CM

## 2015-01-19 DIAGNOSIS — F449 Dissociative and conversion disorder, unspecified: Secondary | ICD-10-CM | POA: Diagnosis not present

## 2015-01-26 ENCOUNTER — Ambulatory Visit (INDEPENDENT_AMBULATORY_CARE_PROVIDER_SITE_OTHER): Payer: Medicare Other | Admitting: Psychology

## 2015-01-26 DIAGNOSIS — F332 Major depressive disorder, recurrent severe without psychotic features: Secondary | ICD-10-CM

## 2015-01-26 DIAGNOSIS — F449 Dissociative and conversion disorder, unspecified: Secondary | ICD-10-CM

## 2015-01-27 ENCOUNTER — Ambulatory Visit (INDEPENDENT_AMBULATORY_CARE_PROVIDER_SITE_OTHER): Payer: Medicare Other | Admitting: Internal Medicine

## 2015-01-27 ENCOUNTER — Encounter: Payer: Self-pay | Admitting: Internal Medicine

## 2015-01-27 VITALS — BP 120/68 | HR 93 | Temp 97.9°F | Resp 14 | Wt 279.0 lb

## 2015-01-27 DIAGNOSIS — I25119 Atherosclerotic heart disease of native coronary artery with unspecified angina pectoris: Secondary | ICD-10-CM

## 2015-01-27 DIAGNOSIS — E1151 Type 2 diabetes mellitus with diabetic peripheral angiopathy without gangrene: Secondary | ICD-10-CM

## 2015-01-27 DIAGNOSIS — E1159 Type 2 diabetes mellitus with other circulatory complications: Secondary | ICD-10-CM

## 2015-01-27 NOTE — Patient Instructions (Signed)
Please change the insulin doses as follows:  Insulin Before breakfast Before lunch Before dinner  Regular (short acting) 45 >> 40 25 45 >> 40  NPH (long acting) 35 >> 40  25 >> 30   Please come back in 2-3 months with your sugar log.

## 2015-01-27 NOTE — Progress Notes (Signed)
Patient ID: [name] [name], male   DOB: May 10, 1956, 59 y.o.   MRN: [id number]  HPI: [name] [name] is a 59 y.o.-year-old male, returning for f/u for DM2, dx 2006, insulin-dependent since 2011, uncontrolled, with complications (CAD - CABG 2012, PVD - s/p Stents, Aortic and carotid stenosis, peripheral neuropathy). Last visit 3 mo ago.  Diabetes control has worsened since last visit.  He has RLS and neuropathy >> had a bad night last night.   Last hemoglobin A1c: Lab Results  Component Value Date   HGBA1C 7.4* 12/10/2014   HGBA1C 6.9* 09/02/2014   HGBA1C 7.2* 06/01/2014   Pt was on a regimen of: - Lantus 60 units bid >> 60 units in HS >> 80 units in HS - Novolog 14 >> 17 >> 25-25-30 We stopped Victoza at a previous visit. He could not afford his insulins (doughnut hole) - 400$.   He is now on: Insulin Before breakfast Before lunch Before dinner  Regular (short acting) 45 25 45  NPH (long acting) 35   25    Pt checks his sugars 3x a day and they are (reviewed log): - am: 150-180 >> 86, 133-289 >> 95-180, 190 >> 76, 98-163, 180, 190 >> 132-144 >> 88-166, 196, 309 - 2h after b'fast: 198, 239 >> n/c >> 363, 377 >> 148 >> 139-148 >> n/c >> 161 - before lunch: 108, 145-204, 274 >> 106-207 >> 134-187, 192, 207 >> 108 >> 70, 79, 123-167, 220 - 2h after lunch: 140-233, 258 >> 104-180 >> 94, 115-189 >> 130-160, 215 >> 70, 101-170, 186 - before dinner: 53 x1, 122-352 >> 120-150 >> 152-188 >> 95-150 >> 87, 95-172, 188 >> n/c >> 101-192 - 2h after dinner:166-200, 255 >> 114-187, 272x1 >> 101-136 lately , 175 >> 89, 139-188 >> 83-135 - bedtime: 242, 319 >> 265 >> n/c >> 127-135 >> 101-276 >> 225 >> n/c >> 106-129 >> 80-135 No lows. Lowest sugar was 69 >> 39! (exertion, could not sleep for 2 nights); ? At what level he has hypoglycemia awareness. Highest sugar was 377 >> 272 >> 224 >> 254 - few 200s.   Pt's meals are: - Breakfast: toast + eggs or cereals + milk - Lunch: soup + 1/2 sandwich -  Dinner: meat + sweet potatoes or pasta + salad - Snacks: 3: sugar free foods, fruit He quit drinking sodas, but drinks tea - artificial sweetener. He also started to drink Almond milk. Exercises by walking 2x a day.  - He has CKD, last BUN/creatinine:  Lab Results  Component Value Date   BUN 17 12/21/2014   CREATININE 1.58* 12/21/2014  He is on Cozaar.Sees nephrology. Has a stone in L kidney, has 3 cysts in R kidney. - last set of lipids: Lab Results  Component Value Date   CHOL 92 12/10/2014   HDL 24.60* 12/10/2014   LDLCALC 42 12/10/2014   LDLDIRECT 64.9 06/01/2014   TRIG 126.0 12/10/2014   CHOLHDL 4 12/10/2014  He  Is on Zocor. - last eye exam was in 08/2014 [facility] DR. + cataracts. - + numbness and tingling in his feet - [facility]) - Dr Elvina Mattes. He had a foot exam 01/11/2014.   He has pain in L leg >> surgery for PVD stents on 04/27/2014. His sugars improved after the stent.   I reviewed pt's medications, allergies, PMH, social hx, family hx, and changes were documented in the history of present illness. Otherwise, unchanged from my initial visit note.  ROS: Constitutional: + weight loss/+ gain, + fatigue, + feeling hot, + nocturia, + poor sleep Eyes: no blurry vision, no xerophthalmia ENT: no sore throat, no nodules palpated in throat, + dysphagia/no odynophagia,no  hoarseness, + decreased hearing Cardiovascular: no CP/+ SOB/no palpitations/+ leg swelling Respiratory:+ cough/+ SOB Gastrointestinal: no N/V/D/+ C Musculoskeletal: + muscle aches/+ bone pain  Skin: no rash, + hair loss Neurological: no tremors/no numbness/tingling/dizziness, + HA + diff with erections, + low libido  PE: BP 120/68 mmHg  Pulse 93  Temp(Src) 97.9 F (36.6 C) (Oral)  Resp 14  Wt 279 lb (126.554 kg)  SpO2 97% Body mass index is 45.05 kg/(m^2). Wt Readings from Last 3 Encounters:  01/27/15 279 lb (126.554 kg)  12/10/14 287 lb 6 oz (130.352 kg)  11/05/14 286 lb  2 oz (129.785 kg)   Constitutional: obese, in NAD, ruddy complexion Eyes: PERRLA, EOMI, no exophthalmos ENT: dry mucous membranes, no thyromegaly, no cervical lymphadenopathy Cardiovascular: RRR, +2/6 SEM, no RG Respiratory: CTA B Gastrointestinal: abdomen soft, NT, ND, BS+ Musculoskeletal: no deformities, strength intact in all 4 Skin: moist, warm, + rash - rosacea on face  Neurological: no tremor with outstretched hands, DTR normal in all 4  ASSESSMENT: 1. DM2, insulin-dependent, uncontrolled, with complications - CAD - s/p CABG 2012 - PVD - s/p stents 2012 - Aortic stenosis - carotid stenosis - CKD - peripheral neuropathy   PLAN:  1. Patient with long-standing, uncontrolled diabetes, on injectable regimen. Diabetes has worsened since last visit, Last HbA1c 7.4% << 6.9%. He has some more lows but some highs before meals >> will increase NPH and decrease the regular insulin as follows:  Patient Instructions   Please change the insulin doses as follows:  Insulin Before breakfast Before lunch Before dinner  Regular (short acting) 45 >> 40 25 45 >> 40  NPH (long acting) 35 >> 40  25 >> 30   Please come back in 2-3 months with your sugar log. - He lost 8 lbs since last visit intentionally - continue to check 3 times a day, rotating checks - up to date with eye exams - check HbA1c at next visit; reviewed HbA1c obtained by PCP - Return to clinic in 3 mo with sugar log

## 2015-01-31 ENCOUNTER — Encounter: Payer: Self-pay | Admitting: Psychiatry

## 2015-01-31 ENCOUNTER — Ambulatory Visit (INDEPENDENT_AMBULATORY_CARE_PROVIDER_SITE_OTHER): Payer: Medicare Other | Admitting: Psychiatry

## 2015-01-31 VITALS — BP 128/78 | HR 81 | Temp 97.8°F | Ht 68.0 in | Wt 280.4 lb

## 2015-01-31 DIAGNOSIS — F419 Anxiety disorder, unspecified: Secondary | ICD-10-CM | POA: Diagnosis not present

## 2015-01-31 DIAGNOSIS — I25119 Atherosclerotic heart disease of native coronary artery with unspecified angina pectoris: Secondary | ICD-10-CM

## 2015-01-31 MED ORDER — BUSPIRONE HCL 7.5 MG PO TABS: 7.5000 mg | ORAL_TABLET | Freq: Two times a day (BID) | ORAL | Status: DC

## 2015-01-31 MED ORDER — SERTRALINE HCL 100 MG PO TABS: 100.0000 mg | ORAL_TABLET | Freq: Every day | ORAL | Status: DC

## 2015-01-31 MED ORDER — BUPROPION HCL ER (XL) 150 MG PO TB24: 150.0000 mg | ORAL_TABLET | Freq: Every day | ORAL | Status: DC
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
## 2015-02-01 ENCOUNTER — Other Ambulatory Visit: Payer: Self-pay | Admitting: Internal Medicine

## 2015-02-02 ENCOUNTER — Telehealth: Payer: Self-pay | Admitting: Psychiatry

## 2015-02-02 ENCOUNTER — Ambulatory Visit (INDEPENDENT_AMBULATORY_CARE_PROVIDER_SITE_OTHER): Payer: Medicare Other | Admitting: Psychology

## 2015-02-02 DIAGNOSIS — F449 Dissociative and conversion disorder, unspecified: Secondary | ICD-10-CM | POA: Diagnosis not present

## 2015-02-02 DIAGNOSIS — F332 Major depressive disorder, recurrent severe without psychotic features: Secondary | ICD-10-CM

## 2015-02-02 NOTE — Telephone Encounter (Signed)
Patient contacted the office because he had a prescription at the pharmacy for his Wellbutrin. He had thought we were to discontinue this medication. I explained him the initial plan was for him to stop his Valium which he states he largely has not been taking. I did start BuSpar to address anxiety and indicated that otherwise will keep his other medications the same until his next visit. Patient states that he already feels like the BuSpar is begun to help him. Patient is now clear on plan to continue the Wellbutrin at this time until the next visit.

## 2015-02-07 ENCOUNTER — Telehealth: Payer: Self-pay | Admitting: Psychiatry

## 2015-02-08 ENCOUNTER — Other Ambulatory Visit: Payer: Self-pay | Admitting: Internal Medicine

## 2015-02-08 MED ORDER — BUSPIRONE HCL 15 MG PO TABS: 15.0000 mg | ORAL_TABLET | Freq: Two times a day (BID) | ORAL | Status: DC

## 2015-02-08 NOTE — Telephone Encounter (Signed)
Patient contacted the clinic stating that he feels like the BuSpar was initially helping him with his anxiety and then it wore off. He states he's been tolerating the 7/2 mg tablets twice a day well. I've instructed him he can now increased to 15 mg twice a day. Thus he is aware of his current tablets that her 7.5 that he will take 2 in the morning and 2 at bedtime. I have ordered another prescription for him and he is aware that will be 15 mg tablets and he will take one of them in the morning and 1 at bedtime. I have ordered BuSpar 15 mg, #60 with one refill. Patient's been encouraged call any questions or concerns prior to his next point. AW

## 2015-02-09 ENCOUNTER — Ambulatory Visit (INDEPENDENT_AMBULATORY_CARE_PROVIDER_SITE_OTHER): Payer: Medicare Other | Admitting: Psychology

## 2015-02-09 DIAGNOSIS — F449 Dissociative and conversion disorder, unspecified: Secondary | ICD-10-CM

## 2015-02-09 DIAGNOSIS — F332 Major depressive disorder, recurrent severe without psychotic features: Secondary | ICD-10-CM | POA: Diagnosis not present

## 2015-02-11 ENCOUNTER — Other Ambulatory Visit: Payer: Self-pay | Admitting: *Deleted

## 2015-02-11 ENCOUNTER — Telehealth: Payer: Self-pay | Admitting: *Deleted

## 2015-02-11 MED ORDER — HYDROCODONE-ACETAMINOPHEN 5-325 MG PO TABS: 1.0000 | ORAL_TABLET | Freq: Four times a day (QID) | ORAL | Status: DC | PRN

## 2015-02-11 NOTE — Telephone Encounter (Signed)
Pt aware Rx will be available for pick up this after noon

## 2015-02-11 NOTE — Telephone Encounter (Signed)
Pt called requesting a refill on Hydrocodone for Neuropathy.  Last OV 5.20.16.  Please advise refill

## 2015-02-11 NOTE — Telephone Encounter (Signed)
Fine to refill Hydrocodone

## 2015-02-15 ENCOUNTER — Other Ambulatory Visit: Payer: Self-pay | Admitting: Internal Medicine

## 2015-02-22 ENCOUNTER — Telehealth: Payer: Self-pay | Admitting: *Deleted

## 2015-02-22 ENCOUNTER — Encounter: Payer: Self-pay | Admitting: Internal Medicine

## 2015-02-22 ENCOUNTER — Ambulatory Visit (INDEPENDENT_AMBULATORY_CARE_PROVIDER_SITE_OTHER): Payer: Medicare Other | Admitting: Internal Medicine

## 2015-02-22 ENCOUNTER — Other Ambulatory Visit: Payer: Self-pay | Admitting: *Deleted

## 2015-02-22 VITALS — BP 118/76 | HR 77 | Ht 68.0 in | Wt 279.0 lb

## 2015-02-22 DIAGNOSIS — Z86010 Personal history of colonic polyps: Secondary | ICD-10-CM

## 2015-02-22 DIAGNOSIS — Z8619 Personal history of other infectious and parasitic diseases: Secondary | ICD-10-CM

## 2015-02-22 DIAGNOSIS — K5903 Drug induced constipation: Secondary | ICD-10-CM

## 2015-02-22 DIAGNOSIS — R1310 Dysphagia, unspecified: Secondary | ICD-10-CM

## 2015-02-22 DIAGNOSIS — K5909 Other constipation: Secondary | ICD-10-CM

## 2015-02-22 DIAGNOSIS — I25119 Atherosclerotic heart disease of native coronary artery with unspecified angina pectoris: Secondary | ICD-10-CM

## 2015-02-22 DIAGNOSIS — T402X5A Adverse effect of other opioids, initial encounter: Secondary | ICD-10-CM

## 2015-02-22 MED ORDER — GLUCOSE BLOOD VI STRP: ORAL_STRIP | Status: DC

## 2015-02-22 MED ORDER — BAYER MICROLET LANCETS MISC: Status: DC

## 2015-02-22 MED ORDER — NA SULFATE-K SULFATE-MG SULF 17.5-3.13-1.6 GM/177ML PO SOLN: ORAL | Status: DC

## 2015-02-22 MED ORDER — NALOXEGOL OXALATE 25 MG PO TABS: 25.0000 mg | ORAL_TABLET | Freq: Every day | ORAL | Status: DC

## 2015-02-22 NOTE — Progress Notes (Signed)
Subjective:    Patient ID: [name] [name], male    DOB: Jul 29, 1955, 59 y.o.   MRN: [id number]  HPI [name] [name] is a 59 year old male with past medical history of adenomatous colon polyps, GERD, H. pylori gastritis status post treatment with confirmed eradication in May 2015, CAD status post CABG and carotid endarterectomy on Plavix, mild aortic stenosis, obesity, hypertension, hyperlipidemia, sleep apnea who is here for follow-up. He was last seen in April 2015.  Today he would like to discuss recurrent colonoscopy for polyp surveillance, chronic constipation and intermittent dysphagia. He had a colonoscopy which was performed on 09/28/2013 which revealed multiple, 12, polyps found to be adenomatous. No high-grade dysplasia or malignancy. Repeat colonoscopy is recommended in one year. He is having issues with constipation and this is despite MiraLAX therapy. MiraLAX and stool softeners have not improved constipation. Lactulose is also not helped an increase his bloating and gas. He's having a bowel movement as infrequently as once weekly. This leads to abdominal pressure and bloating. He denies blood in his stool or melena. Intermittently he has solid and liquid food dysphagia and feels like food stops in his chest. He denies heartburn. He is taking omeprazole 20 mg twice daily. No odynophagia. Denies abdominal pain noted fullness which she associates with constipation. No hepatobiliary complaints.  Review of Systems As per history of present illness, otherwise negative  Current Medications, Allergies, Past Medical History, Past Surgical History, Family History and Social History were reviewed in [employer] [employer] record.     Objective:   Physical Exam BP 118/76 mmHg  Pulse 77  Ht 5\' 8"  (1.727 m)  Wt 279 lb (126.554 kg)  BMI 42.43 kg/m2 Constitutional: Well-developed and well-nourished. No distress. HEENT: Normocephalic and atraumatic. Oropharynx is clear and moist. No  oropharyngeal exudate. Conjunctivae are normal.  No scleral icterus. Neck: Neck supple. Trachea midline. Cardiovascular: Normal rate, regular rhythm and intact distal pulses. 2/6 sem Pulmonary/chest: Effort normal and breath sounds normal. No wheezing, rales or rhonchi. Abdominal: Soft, obese, nontender, nondistended. Bowel sounds active throughout.  Extremities: no clubbing, cyanosis, trace pretibial edema Neurological: Alert and oriented to person place and time. Skin: Skin is warm and dry. Changes of plaque psoriasis upper and lower extremity Psychiatric: Normal mood and affect. Behavior is normal.  CBC    Component Value Date/Time   WBC 9.6 12/21/2014 1215   WBC 9.0 10/14/2013 2006   RBC 4.47 12/21/2014 1215   RBC 4.05* 10/14/2013 2006   HGB 12.8* 12/21/2014 1215   HGB 12.1* 10/14/2013 2006   HCT 39.3 12/21/2014 1215   HCT 36.9* 10/14/2013 2006   PLT 236 12/21/2014 1215   PLT 178 10/14/2013 2006   MCV 87.9 12/21/2014 1215   MCV 91 10/14/2013 2006   MCH 28.6 12/21/2014 1215   MCH 30.0 10/14/2013 2006   MCHC 32.6 12/21/2014 1215   MCHC 32.9 10/14/2013 2006   RDW 15.7* 12/21/2014 1215   RDW 15.9* 10/14/2013 2006   LYMPHSABS 1.3 12/21/2014 1215   MONOABS 0.6 12/21/2014 1215   EOSABS 0.3 12/21/2014 1215   BASOSABS 0.0 12/21/2014 1215    CMP     Component Value Date/Time   NA 138 12/21/2014 1215   NA 138 05/04/2014 0816   K 4.6 12/21/2014 1215   K 4.1 05/04/2014 0816   CL 102 12/21/2014 1215   CL 101 05/04/2014 0816   CO2 26 12/21/2014 1215   CO2 29 05/04/2014 0816   GLUCOSE 198* 12/21/2014 1215  GLUCOSE 195* 05/04/2014 0816   BUN 17 12/21/2014 1215   BUN 24* 05/04/2014 0816   CREATININE 1.58* 12/21/2014 1215   CREATININE 1.78* 05/04/2014 0816   CALCIUM 9.7 12/21/2014 1215   CALCIUM 9.3 05/04/2014 0816   PROT 7.7 12/10/2014 0830   ALBUMIN 3.6 12/10/2014 0830   AST 16 12/10/2014 0830   ALT 18 12/10/2014 0830   ALKPHOS 125* 12/10/2014 0830   BILITOT 0.4  12/10/2014 0830   GFRNONAA 46* 12/21/2014 1215   GFRNONAA 46* 10/14/2013 2006   GFRAA 26* 12/21/2014 1215   GFRAA 18* 10/14/2013 2006       Assessment & Plan:  59 year old male with past medical history of adenomatous colon polyps, GERD, H. pylori gastritis status post treatment with confirmed eradication in May 2015, CAD status post CABG and carotid endarterectomy on Plavix, mild aortic stenosis, obesity, hypertension, hyperlipidemia, sleep apnea who is here for follow-up.  1. Adenomatous colon polyps -- he is due for surveillance colonoscopy at this time. He is on Plavix. Hold plavix 5 days before procedure - will instruct when and how to resume after procedure. Risks and benefits of procedure including bleeding, perforation, infection, missed lesions, medication reactions and possible hospitalization or surgery if complications occur explained. Additional rare but real risk of cardiovascular event such as heart attack or ischemia/infarct of other organs off Plvix explained and need to seek urgent help if this occurs. Will communicate by phone or EMR with patient's prescribing provider that to confirm holding Plavix is reasonable in this case.   2. Opiate-induced constipation -- no response to MiraLAX, stool softeners and lactulose. Discontinue these laxative and trial of Movantik 25 mg daily. He is asked to take this one hour before 2 hours after breakfast. Call if diarrhea or other troublesome side effects  3. Dysphagia -- no stricture seen 1 year ago at upper endoscopy, barium swallow with tablet recommended. We'll also schedule EGD given that he will be off Plavix but will base the need for dilation on barium swallow. The procedure was discussed including the risks, benefits and alternatives and he agrees to proceed  4. H. Pylori -- history of H. pylori gastritis with confirmed eradication by stool antigen in May 2015

## 2015-02-22 NOTE — Telephone Encounter (Signed)
02/22/2015 RE: [name] [name] DOB: 02-Jun-1956 MRN: [id number]  Dear Dr Fletcher Anon,   We have scheduled the above patient for an endoscopy and colonoscopy procedure. Our records show that he is on anticoagulation therapy.  Please advise as to whether the patient may come off his therapy of Plavix 5 days prior to the procedure, which is scheduled for 04/29/15. Please route your response to Dixon Boos, CMA.   Sincerely,   Dixon Boos

## 2015-02-22 NOTE — Patient Instructions (Signed)
You have been scheduled for an endoscopy and colonoscopy. Please follow written instructions given to you at your visit today.  Please pick up your prep supplies at the pharmacy within the next 1-3 days. If you use inhalers (even only as needed), please bring them with you on the day of your procedure. Your physician has requested that you go to www.startemmi.com and enter the access code given to you at your visit today. This web site gives a general overview about your procedure. However, you should still follow specific instructions given to you by our office regarding your preparation for the procedure.  You will be contacted by our office prior to your procedure for directions on holding your Plavix.  If you do not hear from our office 1 week prior to your scheduled procedure, please call [phone] to discuss.   We have sent the following medications to your pharmacy for you to pick up at your convenience: Movantik 25 mg tablet-1 tablet 1 hour before meals  Please discontinue lactulose and miralax.  You have been scheduled for a Barium Esophogram at [facility] (1st floor of the hospital) on Monday, 02/28/15 at 10:00 am. Please arrive 15 minutes prior to your appointment for registration. Make certain not to have anything to eat or drink 6 hours prior to your test. If you need to reschedule for any reason, please contact radiology at [phone] to do so. __________________________________________________________________ A barium swallow is an examination that concentrates on views of the esophagus. This tends to be a double contrast exam (barium and two liquids which, when combined, create a gas to distend the wall of the oesophagus) or single contrast (non-ionic iodine based). The study is usually tailored to your symptoms so a good history is essential. Attention is paid during the study to the form, structure and configuration of the esophagus, looking for functional disorders  (such as aspiration, dysphagia, achalasia, motility and reflux) EXAMINATION You may be asked to change into a gown, depending on the type of swallow being performed. A radiologist and radiographer will perform the procedure. The radiologist will advise you of the type of contrast selected for your procedure and direct you during the exam. You will be asked to stand, sit or lie in several different positions and to hold a small amount of fluid in your mouth before being asked to swallow while the imaging is performed .In some instances you may be asked to swallow barium coated marshmallows to assess the motility of a solid food bolus. The exam can be recorded as a digital or video fluoroscopy procedure. POST PROCEDURE It will take 1-2 days for the barium to pass through your system. To facilitate this, it is important, unless otherwise directed, to increase your fluids for the next 24-48hrs and to resume your normal diet.  This test typically takes about 30 minutes to perform. __________________________________________________________________________________

## 2015-02-23 ENCOUNTER — Ambulatory Visit (INDEPENDENT_AMBULATORY_CARE_PROVIDER_SITE_OTHER): Payer: Medicare Other | Admitting: Psychiatry

## 2015-02-23 ENCOUNTER — Encounter: Payer: Self-pay | Admitting: Psychiatry

## 2015-02-23 VITALS — BP 122/78 | HR 75 | Temp 94.6°F | Ht 68.5 in | Wt 278.2 lb

## 2015-02-23 DIAGNOSIS — I25119 Atherosclerotic heart disease of native coronary artery with unspecified angina pectoris: Secondary | ICD-10-CM | POA: Diagnosis not present

## 2015-02-23 DIAGNOSIS — F411 Generalized anxiety disorder: Secondary | ICD-10-CM

## 2015-02-23 MED ORDER — BUSPIRONE HCL 10 MG PO TABS: 20.0000 mg | ORAL_TABLET | Freq: Two times a day (BID) | ORAL | Status: DC

## 2015-02-23 NOTE — Progress Notes (Signed)
BH MD/PA/NP OP Progress Note  02/23/2015 10:47 AM [name] [name]  MRN:  [id number]  Subjective:  Patient returns for follow-up was generalized anxiety disorder and depression. He states he does feel like the BuSpar has been helpful to his anxiety. He does discuss that he would like to decrease the number of medications he is on which is a reasonable goal as he is on several medications. We discussed that we could perhaps address his anxiety through use of BuSpar in sertraline that perhaps a limiting Wellbutrin might be a reason why. Patient states he has been on Wellbutrin for about a year and has not noticed a significant difference on that medication. He states he is able to enjoy things and is not experiencing significant depression at this time. He states he'll be leaving tomorrow for [place] to go see relatives in [place].  He spent some of the appointment discussing past life events such as living in [place] and people there who would use opioid pain medications. He brought this up in the context of he is not looking for any medications to get "high." Chief Complaint:  Chief Complaint    Medication Refill; Follow-up     Visit Diagnosis:  No diagnosis found.  Past Medical History:  Past Medical History  Diagnosis Date  . Peripheral vascular disease   . Diabetes mellitus   . Mild aortic stenosis   . Tobacco use disorder     recently quit  . Depression   . Psoriasis   . Neuropathy   . Obesity   . Post splenectomy syndrome   . SOB (shortness of breath)   . Carotid artery occlusion   . Coronary artery disease   . Hyperlipidemia   . Hypertension   . Bell's palsy 2007  . Sleep apnea   . Angina at rest     chronic  . Cataract     Dr. Dawna Part  . Anxiety   . Arthritis   . DVT (deep venous thrombosis)     in leg  . Stroke   . Allergy   . Heart murmur   . Tubular adenoma 08/2013    Dr. Hilarie Fredrickson  . Adenomatous colon polyp   . Gastropathy   . Duodenitis   . Helicobacter pylori  gastritis   . Diverticulosis     Past Surgical History  Procedure Laterality Date  . Splenectomy    . Heart stents  Jan 2011    leg stents 06/2009 and 03/2010  . Coronary artery bypass graft  09/06/2010    At [facility]: LIMA to LAD, left radial to RCA, sequential SVG to OM3 and 4  . Carotid endarterectomy  08/2010    left/ Dr. Kellie Simmering  . Pta of illiac and sfa  multiple    Dr. Ronalee Belts, s/p revision 11/2012  . Colonoscopy    . Cardiac catheterization  08/2010    LAD: 80% ISR, RCA: 80% ostial, OM 80-90%  . Coronary angioplasty      LAD: before CABG   Family History:  Family History  Problem Relation Age of Onset  . Lung cancer Father 53  . Breast cancer Sister 67  . Diabetes Paternal Grandfather   . Colon polyps Paternal Grandfather 27  . Dementia Mother   . Alzheimer's disease Mother    Social History:  History   Social History  . Marital Status: Married    Spouse Name: N/A  . Number of Children: 0  . Years of Education: N/A   Occupational  History  .     Social History Main Topics  . Smoking status: Current Some Day Smoker -- 0.50 packs/day for 40 years    Types: Cigarettes  . Smokeless tobacco: Never Used     Comment: smokes 2-3 cigarettes every 3 weeks  . Alcohol Use: No  . Drug Use: No  . Sexual Activity: No   Other Topics Concern  . None   Social History Narrative   Additional History:   Assessment:   Musculoskeletal: Strength & Muscle Tone: within normal limits Gait & Station: unsteady walks with cane Patient leans: N/A  Psychiatric Specialty Exam: HPI  Review of Systems  Psychiatric/Behavioral: Negative for depression, suicidal ideas, hallucinations, memory loss and substance abuse. The patient is nervous/anxious. The patient does not have insomnia.     Blood pressure 122/78, pulse 75, temperature 94.6 F (34.8 C), temperature source Tympanic, height 5' 8.5" (1.74 m), weight 278 lb 3.2 oz (126.191 kg), SpO2 95 %.Body mass index is 41.68 kg/(m^2).   General Appearance: Well Groomed  Eye Contact:  Good  Speech:  Normal Rate  Volume:  Normal  Mood:  Good  Affect:  Congruent  Thought Process:  Circumstantial  Orientation:  Full (Time, Place, and Person)  Thought Content:  Negative  Suicidal Thoughts:  No  Homicidal Thoughts:  No  Memory:  Immediate;   Good Recent;   Good Remote;   Good  Judgement:  Good  Insight:  Good  Psychomotor Activity:  Negative  Concentration:  Good  Recall:  Good  Fund of Knowledge: Good  Language: Good  Akathisia:  Negative  Handed:  Right  AIMS (if indicated):  N/A  Assets:  Communication Skills Desire for Improvement Social Support  ADL's:  Intact  Cognition: WNL  Sleep:  Good    Is the patient at risk to self?  No. Has the patient been a risk to self in the past 6 months?  No. Has the patient been a risk to self within the distant past?  No. Is the patient a risk to others?  No. Has the patient been a risk to others in the past 6 months?  No. Has the patient been a risk to others within the distant past?  No.  Current Medications: Current Outpatient Prescriptions  Medication Sig Dispense Refill  . albuterol (PROVENTIL HFA;VENTOLIN HFA) 108 (90 BASE) MCG/ACT inhaler Inhale 2 puffs into the lungs every 4 (four) hours as needed for wheezing or shortness of breath. 1 Inhaler 6  . aspirin 81 MG tablet Take 81 mg by mouth daily.    [name] BAYER MICROLET LANCETS lancets use as directed three times a day Dx: E11.9 100 each 12  . buPROPion (WELLBUTRIN XL) 150 MG 24 hr tablet Take 1 tablet (150 mg total) by mouth daily. 30 tablet 1  . busPIRone (BUSPAR) 10 MG tablet Take 2 tablets (20 mg total) by mouth 2 (two) times daily. 120 tablet 1  . cholecalciferol (VITAMIN D) 1000 UNITS tablet Take 1,000 Units by mouth daily.      . Cinnamon 500 MG capsule Take 1,000 mg by mouth daily.    . clopidogrel (PLAVIX) 75 MG tablet Take 1 tablet (75 mg total) by mouth daily. 90 tablet 4  . cyclobenzaprine (FLEXERIL)  10 MG tablet Take 10 mg by mouth 2 (two) times daily as needed.     [name] gentamicin ointment (GARAMYCIN) 0.1 % Apply 1 application topically 3 (three) times daily. 15 g 0  . Glucosamine-Chondroit-Vit C-Mn (GLUCOSAMINE  CHONDR 500 COMPLEX) CAPS Take 1,000 each by mouth daily.    [name] glucose blood (BAYER CONTOUR TEST) test strip Use to test blood sugar three times daily as instructed.  Dx code: E11.9 100 each 5  . HYDROcodone-acetaminophen (NORCO/VICODIN) 5-325 MG per tablet Take 1-2 tablets by mouth every 6 (six) hours as needed for moderate pain or severe pain. 90 tablet 0  . insulin NPH Human (NOVOLIN N RELION) 100 UNIT/ML injection Inject 0.35-0.4 mLs (35-40 Units total) into the skin 2 (two) times daily before a meal. 45 mL 0  . Insulin Pen Needle (NOVOFINE) 32G X 6 MM MISC Use three times daily 100 each 5  . insulin regular (NOVOLIN R,HUMULIN R) 100 units/mL injection Inject 110 units daily as avised (Patient taking differently: Inject 25-45 Units into the skin 3 (three) times daily before meals. 40 units in the morning, 25 units at lunch, 45 units at dinner) 40 mL 11  . Lancet Device MISC 1 application by Does not apply route as needed. 100 each 11  . losartan (COZAAR) 25 MG tablet take 1 tablet by mouth once daily 90 tablet 3  . magnesium gluconate (MAGONATE) 500 MG tablet Take 1,000 mg by mouth daily.    . metoprolol succinate (TOPROL-XL) 50 MG 24 hr tablet take 1 tablet by mouth twice a day 60 tablet 5  . Multiple Vitamin (MULTIVITAMIN) capsule Take 1 capsule by mouth daily.      . Na Sulfate-K Sulfate-Mg Sulf SOLN Use as directed for colonoscopy prep 354 mL 0  . naloxegol oxalate (MOVANTIK) 25 MG TABS tablet Take 1 tablet (25 mg total) by mouth daily. 1 hour before a meal 30 tablet 2  . nitazoxanide (ALINIA) 500 MG tablet Take 1 tablet (500 mg total) by mouth 2 (two) times daily with a meal. 20 tablet 0  . nitroGLYCERIN (NITROSTAT) 0.4 MG SL tablet Place 1 tablet (0.4 mg total) under the tongue  every 5 (five) minutes as needed. 25 tablet 1  . Omega-3 Fatty Acids (FISH OIL PO) Take by mouth.    [name] omeprazole (PRILOSEC) 20 MG capsule Take 1 capsule (20 mg total) by mouth 2 (two) times daily. 20 capsule 0  . polyethylene glycol (MIRALAX) packet Take 17 g by mouth daily. 14 each 3  . RELION INSULIN SYRINGE 1ML/31G 31G X 5/16" 1 ML MISC USE THREE TIMES A DAY. 290 each 1  . sertraline (ZOLOFT) 100 MG tablet Take 1 tablet (100 mg total) by mouth daily. 30 tablet 1  . simvastatin (ZOCOR) 20 MG tablet Take 1 tablet (20 mg total) by mouth daily at 6 PM. 90 tablet 3  . tamsulosin (FLOMAX) 0.4 MG CAPS capsule take 1 capsule by mouth every morning 30 capsule 5   No current facility-administered medications for this visit.    Medical Decision Making:  Established Problem, Stable/Improving (1) and Review of New Medication or Change in Dosage (2)  Treatment Plan Summary:Medication management and Plan We will continue the sertraline at 100 mg daily. We'll increase his BuSpar to 20 mg twice daily. We will discontinue the Wellbutrin as patient reports he has not noticed much benefit after a year and also we are trying to decrease the polypharmacy. Patient will follow up in 1 month. He's been encouraged call any questions or concerns prior to his next appointment.   Faith Rogue 02/23/2015, 10:47 AM

## 2015-02-28 ENCOUNTER — Ambulatory Visit (HOSPITAL_COMMUNITY): Payer: Medicare Other

## 2015-03-01 NOTE — Telephone Encounter (Signed)
Plavix can be stopped 5 days before colonoscopy procedure from a cardias standpoint. Should also check with Dr. Ronalee Belts who followed his PAD.   Dr. Fletcher Anon

## 2015-03-02 ENCOUNTER — Other Ambulatory Visit: Payer: Self-pay | Admitting: *Deleted

## 2015-03-02 MED ORDER — LANCET DEVICE MISC: 1.0000 "application " | Status: DC | PRN

## 2015-03-02 NOTE — Telephone Encounter (Signed)
Dr Ronalee Belts, please see correspondence below... Are you okay with patient holding Plavix prior to procedure?

## 2015-03-09 ENCOUNTER — Ambulatory Visit (INDEPENDENT_AMBULATORY_CARE_PROVIDER_SITE_OTHER): Payer: Medicare Other | Admitting: Psychology

## 2015-03-09 DIAGNOSIS — F332 Major depressive disorder, recurrent severe without psychotic features: Secondary | ICD-10-CM | POA: Diagnosis not present

## 2015-03-09 DIAGNOSIS — F449 Dissociative and conversion disorder, unspecified: Secondary | ICD-10-CM

## 2015-03-10 DIAGNOSIS — L400 Psoriasis vulgaris: Secondary | ICD-10-CM | POA: Diagnosis not present

## 2015-03-10 DIAGNOSIS — L570 Actinic keratosis: Secondary | ICD-10-CM | POA: Diagnosis not present

## 2015-03-10 DIAGNOSIS — L820 Inflamed seborrheic keratosis: Secondary | ICD-10-CM | POA: Diagnosis not present

## 2015-03-10 DIAGNOSIS — L905 Scar conditions and fibrosis of skin: Secondary | ICD-10-CM | POA: Diagnosis not present

## 2015-03-10 DIAGNOSIS — L718 Other rosacea: Secondary | ICD-10-CM | POA: Diagnosis not present

## 2015-03-10 NOTE — Telephone Encounter (Signed)
We have received faxed correspondence from Dr Ronalee Belts that from his standpoint, patient may hold Plavix 5 days prior to test. I have spoken to patient to advise him that both Dr Ronalee Belts and Dr Fletcher Anon have okayed holding Plavix 5 days prior to his procedure. Patient verbalizes understanding.

## 2015-03-11 ENCOUNTER — Ambulatory Visit (HOSPITAL_COMMUNITY)
Admission: RE | Admit: 2015-03-11 | Discharge: 2015-03-11 | Disposition: A | Discharge status: Home / Self Care | Payer: Medicare Other | Source: Ambulatory Visit | Attending: Internal Medicine | Admitting: Internal Medicine

## 2015-03-11 DIAGNOSIS — R1310 Dysphagia, unspecified: Secondary | ICD-10-CM | POA: Diagnosis present

## 2015-03-11 DIAGNOSIS — K225 Diverticulum of esophagus, acquired: Secondary | ICD-10-CM | POA: Diagnosis not present

## 2015-03-11 DIAGNOSIS — K224 Dyskinesia of esophagus: Secondary | ICD-10-CM | POA: Insufficient documentation

## 2015-03-14 ENCOUNTER — Encounter: Payer: Self-pay | Admitting: Internal Medicine

## 2015-03-14 ENCOUNTER — Ambulatory Visit (INDEPENDENT_AMBULATORY_CARE_PROVIDER_SITE_OTHER): Payer: Medicare Other | Admitting: Internal Medicine

## 2015-03-14 VITALS — BP 148/81 | HR 89 | Temp 98.0°F | Ht 68.0 in | Wt 277.1 lb

## 2015-03-14 DIAGNOSIS — E1151 Type 2 diabetes mellitus with diabetic peripheral angiopathy without gangrene: Secondary | ICD-10-CM | POA: Diagnosis not present

## 2015-03-14 DIAGNOSIS — I739 Peripheral vascular disease, unspecified: Secondary | ICD-10-CM | POA: Diagnosis not present

## 2015-03-14 DIAGNOSIS — I25119 Atherosclerotic heart disease of native coronary artery with unspecified angina pectoris: Secondary | ICD-10-CM

## 2015-03-14 DIAGNOSIS — I10 Essential (primary) hypertension: Secondary | ICD-10-CM

## 2015-03-14 DIAGNOSIS — Z23 Encounter for immunization: Secondary | ICD-10-CM

## 2015-03-14 DIAGNOSIS — E1159 Type 2 diabetes mellitus with other circulatory complications: Secondary | ICD-10-CM

## 2015-03-14 DIAGNOSIS — E785 Hyperlipidemia, unspecified: Secondary | ICD-10-CM

## 2015-03-14 LAB — LIPID PANEL
Cholesterol: 115 mg/dL (ref 0–200)
HDL: 28 mg/dL — ABNORMAL LOW (ref >39.00)
LDL Cholesterol: 56 mg/dL (ref 0–99)
NonHDL: 87.22
Total CHOL/HDL Ratio: 4
Triglycerides: 157 mg/dL — ABNORMAL HIGH (ref 0.0–149.0)
VLDL: 31.4 mg/dL (ref 0.0–40.0)

## 2015-03-14 LAB — HEMOGLOBIN A1C: Hgb A1c MFr Bld: 6.4 % (ref 4.6–6.5)

## 2015-03-14 NOTE — Progress Notes (Signed)
Pre visit review using our clinic review tool, if applicable. No additional management support is needed unless otherwise documented below in the visit note. 

## 2015-03-14 NOTE — Assessment & Plan Note (Signed)
BP Readings from Last 3 Encounters:  03/14/15 148/81  02/23/15 122/78  02/22/15 118/76   BP generally well controlled, slightly high today. Will check renal function with labs.

## 2015-03-14 NOTE — Assessment & Plan Note (Signed)
Will check lipids with labs. 

## 2015-03-14 NOTE — Assessment & Plan Note (Signed)
Will check A1c with labs. Continue current medications. 

## 2015-03-14 NOTE — Assessment & Plan Note (Signed)
Severe bilateral leg pain 2/2 PVD. Discussed some options for medication. Unable to take NSAIDS. Intolerant of Lyrica and Neurontin. Discussed referral to pain management. He will follow up with Vascular tomorrow. Follow up here in 3 months and prn.

## 2015-03-14 NOTE — Patient Instructions (Signed)
Labs today.  Follow up with vascular as scheduled.

## 2015-03-14 NOTE — Assessment & Plan Note (Signed)
Wt Readings from Last 3 Encounters:  03/14/15 277 lb 2 oz (125.703 kg)  02/23/15 278 lb 3.2 oz (126.191 kg)  02/22/15 279 lb (126.554 kg)   Body mass index is 42.15 kg/(m^2). Congratulated pt on weight loss. Encouraged healthy diet and exercise.

## 2015-03-14 NOTE — Progress Notes (Signed)
Subjective:    Patient ID: [name] [name], male    DOB: October 27, 1955, 59 y.o.   MRN: [id number]  HPI  59YO male presents for follow up.  DM - BG in low 100s mostly. Compliant with medications.  Scheduled to have colonoscopy and EGD with Dr. Hilarie Fredrickson to evaluate adenomatous polyps and dysphagia.  Following a healthier diet. At home, weight down to 270lbs.  Had episode of weakness and fall in 11/2014. This has improved.  GAD - Now followed by Dr. Jimmye Norman. Changed to Buspar and Sertraline. Tolerating well. Stopped Wellbutrin. Symptoms improved generally, however this past week difficult because of pain.  Chronic pain - Described as searing pain in both legs. Tried Lyrica and Neurontin to help with pain in past, however had higher BG. Taking Hydrocodone only on occasion for severe leg pain. Seeing Dr. Ronalee Belts tomorrow for follow up.  Wt Readings from Last 3 Encounters:  03/14/15 277 lb 2 oz (125.703 kg)  02/23/15 278 lb 3.2 oz (126.191 kg)  02/22/15 279 lb (126.554 kg)   BP Readings from Last 3 Encounters:  03/14/15 148/81  02/23/15 122/78  02/22/15 118/76     Past Medical History  Diagnosis Date  . Peripheral vascular disease   . Diabetes mellitus   . Mild aortic stenosis   . Tobacco use disorder     recently quit  . Depression   . Psoriasis   . Neuropathy   . Obesity   . Post splenectomy syndrome   . SOB (shortness of breath)   . Carotid artery occlusion   . Coronary artery disease   . Hyperlipidemia   . Hypertension   . Bell's palsy 2007  . Sleep apnea   . Angina at rest     chronic  . Cataract     Dr. Dawna Part  . Anxiety   . Arthritis   . DVT (deep venous thrombosis)     in leg  . Stroke   . Allergy   . Heart murmur   . Tubular adenoma 08/2013    Dr. Hilarie Fredrickson  . Adenomatous colon polyp   . Gastropathy   . Duodenitis   . Helicobacter pylori gastritis   . Diverticulosis    Family History  Problem Relation Age of Onset  . Lung cancer Father 63  .  Breast cancer Sister 8  . Diabetes Paternal Grandfather   . Colon polyps Paternal Grandfather 63  . Dementia Mother   . Alzheimer's disease Mother    Past Surgical History  Procedure Laterality Date  . Splenectomy    . Heart stents  Jan 2011    leg stents 06/2009 and 03/2010  . Coronary artery bypass graft  09/06/2010    At [facility]: LIMA to LAD, left radial to RCA, sequential SVG to OM3 and 4  . Carotid endarterectomy  08/2010    left/ Dr. Kellie Simmering  . Pta of illiac and sfa  multiple    Dr. Ronalee Belts, s/p revision 11/2012  . Colonoscopy    . Cardiac catheterization  08/2010    LAD: 80% ISR, RCA: 80% ostial, OM 80-90%  . Coronary angioplasty      LAD: before CABG   Social History   Social History  . Marital Status: Married    Spouse Name: N/A  . Number of Children: 0  . Years of Education: N/A   Occupational History  .     Social History Main Topics  . Smoking status: Current Some Day Smoker -- 0.50 packs/day  for 40 years    Types: Cigarettes  . Smokeless tobacco: Never Used     Comment: smokes 2-3 cigarettes every 3 weeks  . Alcohol Use: No  . Drug Use: No  . Sexual Activity: No   Other Topics Concern  . None   Social History Narrative    Review of Systems  Constitutional: Negative for fever, chills, activity change, appetite change, fatigue and unexpected weight change.  Eyes: Negative for visual disturbance.  Respiratory: Negative for cough and shortness of breath.   Cardiovascular: Negative for chest pain, palpitations and leg swelling.  Gastrointestinal: Negative for abdominal pain and abdominal distention.  Genitourinary: Negative for dysuria, urgency and difficulty urinating.  Musculoskeletal: Positive for myalgias, back pain and arthralgias. Negative for gait problem.  Skin: Negative for color change and rash.  Neurological: Positive for numbness. Negative for weakness, light-headedness and headaches.  Hematological: Negative for adenopathy.    Psychiatric/Behavioral: Negative for suicidal ideas, sleep disturbance and dysphoric mood. The patient is not nervous/anxious.        Objective:    BP 148/81 mmHg  Pulse 89  Temp(Src) 98 F (36.7 C) (Oral)  Ht 5\' 8"  (1.727 m)  Wt 277 lb 2 oz (125.703 kg)  BMI 42.15 kg/m2  SpO2 98% Physical Exam  Constitutional: He is oriented to person, place, and time. He appears well-developed and well-nourished. No distress.  HENT:  Head: Normocephalic and atraumatic.  Right Ear: External ear normal.  Left Ear: External ear normal.  Nose: Nose normal.  Mouth/Throat: Oropharynx is clear and moist. No oropharyngeal exudate.  Eyes: Conjunctivae and EOM are normal. Pupils are equal, round, and reactive to light. Right eye exhibits no discharge. Left eye exhibits no discharge. No scleral icterus.  Neck: Normal range of motion. Neck supple. No tracheal deviation present. No thyromegaly present.  Cardiovascular: Normal rate, regular rhythm and normal heart sounds.  Exam reveals no gallop and no friction rub.   No murmur heard. Pulmonary/Chest: Effort normal and breath sounds normal. No accessory muscle usage. No tachypnea. No respiratory distress. He has no decreased breath sounds. He has no wheezes. He has no rhonchi. He has no rales. He exhibits no tenderness.  Musculoskeletal: Normal range of motion. He exhibits no edema.  Lymphadenopathy:    He has no cervical adenopathy.  Neurological: He is alert and oriented to person, place, and time. No cranial nerve deficit. Coordination normal.  Skin: Skin is warm and dry. No rash noted. He is not diaphoretic. No erythema. No pallor.  Psychiatric: He has a normal mood and affect. His behavior is normal. Judgment and thought content normal.          Assessment & Plan:   Problem List Items Addressed This Visit      Unprioritized   Hyperlipidemia    Will check lipids with labs.      Hypertension    BP Readings from Last 3 Encounters:  03/14/15  148/81  02/23/15 122/78  02/22/15 118/76   BP generally well controlled, slightly high today. Will check renal function with labs.      PVD (peripheral vascular disease)    Severe bilateral leg pain 2/2 PVD. Discussed some options for medication. Unable to take NSAIDS. Intolerant of Lyrica and Neurontin. Discussed referral to pain management. He will follow up with Vascular tomorrow. Follow up here in 3 months and prn.      Severe obesity (BMI >= 40)    Wt Readings from Last 3 Encounters:  03/14/15 277  lb 2 oz (125.703 kg)  02/23/15 278 lb 3.2 oz (126.191 kg)  02/22/15 279 lb (126.554 kg)   Body mass index is 42.15 kg/(m^2). Congratulated pt on weight loss. Encouraged healthy diet and exercise.      Type 2 diabetes mellitus with vascular disease - Primary    Will check A1c with labs. Continue current medications.      Relevant Orders   Comprehensive metabolic panel   Hemoglobin A1c   Lipid panel   Microalbumin / creatinine urine ratio       Return in about 3 months (around 06/14/2015) for Recheck of Diabetes.

## 2015-03-15 DIAGNOSIS — M79609 Pain in unspecified limb: Secondary | ICD-10-CM | POA: Diagnosis not present

## 2015-03-15 DIAGNOSIS — E1169 Type 2 diabetes mellitus with other specified complication: Secondary | ICD-10-CM | POA: Diagnosis not present

## 2015-03-15 DIAGNOSIS — E785 Hyperlipidemia, unspecified: Secondary | ICD-10-CM | POA: Diagnosis not present

## 2015-03-15 DIAGNOSIS — E669 Obesity, unspecified: Secondary | ICD-10-CM | POA: Diagnosis not present

## 2015-03-15 DIAGNOSIS — I10 Essential (primary) hypertension: Secondary | ICD-10-CM | POA: Diagnosis not present

## 2015-03-15 DIAGNOSIS — I70213 Atherosclerosis of native arteries of extremities with intermittent claudication, bilateral legs: Secondary | ICD-10-CM | POA: Diagnosis not present

## 2015-03-15 DIAGNOSIS — I739 Peripheral vascular disease, unspecified: Secondary | ICD-10-CM | POA: Diagnosis not present

## 2015-03-15 DIAGNOSIS — I6529 Occlusion and stenosis of unspecified carotid artery: Secondary | ICD-10-CM | POA: Diagnosis not present

## 2015-03-15 DIAGNOSIS — I2510 Atherosclerotic heart disease of native coronary artery without angina pectoris: Secondary | ICD-10-CM | POA: Diagnosis not present

## 2015-03-15 LAB — MICROALBUMIN / CREATININE URINE RATIO
Creatinine,U: 64.3 mg/dL
Microalb Creat Ratio: 26.8 mg/g (ref 0.0–30.0)
Microalb, Ur: 17.2 mg/dL — ABNORMAL HIGH (ref 0.0–1.9)

## 2015-03-16 ENCOUNTER — Ambulatory Visit: Payer: Medicare Other | Admitting: Psychology

## 2015-03-16 LAB — COMPREHENSIVE METABOLIC PANEL
ALT: 16 U/L (ref 0–53)
AST: 17 U/L (ref 0–37)
Albumin: 3.9 g/dL (ref 3.5–5.2)
Alkaline Phosphatase: 124 U/L — ABNORMAL HIGH (ref 39–117)
BUN: 22 mg/dL (ref 6–23)
CO2: 26 mEq/L (ref 19–32)
Calcium: 8.8 mg/dL (ref 8.4–10.5)
Chloride: 101 mEq/L (ref 96–112)
Creatinine, Ser: 1.5 mg/dL (ref 0.40–1.50)
GFR: 50.49 mL/min — ABNORMAL LOW (ref >60.00)
Glucose, Bld: 197 mg/dL — ABNORMAL HIGH (ref 70–99)
Potassium: 5.5 mEq/L — ABNORMAL HIGH (ref 3.5–5.1)
Sodium: 138 mEq/L (ref 135–145)
Total Bilirubin: 0.3 mg/dL (ref 0.2–1.2)
Total Protein: 7.5 g/dL (ref 6.0–8.3)

## 2015-03-17 ENCOUNTER — Other Ambulatory Visit: Payer: Self-pay

## 2015-03-17 ENCOUNTER — Telehealth: Payer: Self-pay | Admitting: Psychiatry

## 2015-03-17 DIAGNOSIS — N400 Enlarged prostate without lower urinary tract symptoms: Secondary | ICD-10-CM

## 2015-03-17 DIAGNOSIS — F419 Anxiety disorder, unspecified: Secondary | ICD-10-CM

## 2015-03-17 MED ORDER — SERTRALINE HCL 100 MG PO TABS: 150.0000 mg | ORAL_TABLET | Freq: Every day | ORAL | Status: DC

## 2015-03-17 MED ORDER — TAMSULOSIN HCL 0.4 MG PO CAPS
0.4000 mg | ORAL_CAPSULE | Freq: Every day | ORAL | Status: DC
Start: 2015-03-17 — End: 2015-04-28

## 2015-03-17 NOTE — Telephone Encounter (Signed)
Patient contacted the clinic stating he is feeling anxious and that it has worsened. He denies any other issues such as suicidal ideation. I've instructed patient to increase his sertraline from 100 mg a day to  150 mg a day. He is aware that means he should take 1-1/2 of his 100 mg tablets. He has follow-up with me in early September. I've encouraged call any questions or concerns prior to his next point.

## 2015-03-18 ENCOUNTER — Encounter: Payer: Self-pay | Admitting: Cardiovascular Disease

## 2015-03-18 ENCOUNTER — Ambulatory Visit (INDEPENDENT_AMBULATORY_CARE_PROVIDER_SITE_OTHER): Payer: Medicare Other | Admitting: Cardiovascular Disease

## 2015-03-18 VITALS — BP 140/70 | HR 93 | Ht 67.0 in | Wt 279.5 lb

## 2015-03-18 DIAGNOSIS — I739 Peripheral vascular disease, unspecified: Secondary | ICD-10-CM

## 2015-03-18 DIAGNOSIS — R079 Chest pain, unspecified: Secondary | ICD-10-CM | POA: Diagnosis not present

## 2015-03-18 DIAGNOSIS — I779 Disorder of arteries and arterioles, unspecified: Secondary | ICD-10-CM | POA: Diagnosis not present

## 2015-03-18 DIAGNOSIS — I350 Nonrheumatic aortic (valve) stenosis: Secondary | ICD-10-CM | POA: Diagnosis not present

## 2015-03-18 DIAGNOSIS — I2510 Atherosclerotic heart disease of native coronary artery without angina pectoris: Secondary | ICD-10-CM | POA: Diagnosis not present

## 2015-03-18 DIAGNOSIS — E785 Hyperlipidemia, unspecified: Secondary | ICD-10-CM

## 2015-03-18 NOTE — Progress Notes (Signed)
HPI  This is a 59 year old male who is here today for a followup visit. He has known history of coronary artery disease status post drug-eluting stent placements to the LAD in 2011. Repeat cardiac catheterization in 2012 showed significant restenosis with progression of three-vessel disease. He underwent coronary artery bypass graft surgery at that time. He also had left carotid endarterectomy at the same time. He has multiple medical conditions that include type 2 diabetes, hypertension, hyperlipidemia, mild aortic stenosis and peripheral arterial disease which is being followed by Dr. Ronalee Belts. Most recent stress test was done in of 2013 which showed no evidence of ischemia. Echocardiogram in September 2015 showed normal LV systolic function with mild aortic stenosis. He has been doing reasonably well and has been trying to lose weight. Unfortunately, he continues to smoke. He has rare episodes of chest pain and has chronic exertional dyspnea.  Allergies  Allergen Reactions  . Glipizide Other (See Comments)    ANTIDIABETICS. Burning  . Metrizamide     Got very hot and red  . Penicillins Hives and Swelling  . Contrast Media [Iodinated Diagnostic Agents] Rash    Got very hot and red     Current Outpatient Prescriptions on File Prior to Visit  Medication Sig Dispense Refill  . albuterol (PROVENTIL HFA;VENTOLIN HFA) 108 (90 BASE) MCG/ACT inhaler Inhale 2 puffs into the lungs every 4 (four) hours as needed for wheezing or shortness of breath. 1 Inhaler 6  . aspirin 81 MG tablet Take 81 mg by mouth daily.    [name] BAYER MICROLET LANCETS lancets use as directed three times a day Dx: E11.9 100 each 12  . busPIRone (BUSPAR) 10 MG tablet Take 2 tablets (20 mg total) by mouth 2 (two) times daily. 120 tablet 1  . busPIRone (BUSPAR) 15 MG tablet   0  . cholecalciferol (VITAMIN D) 1000 UNITS tablet Take 1,000 Units by mouth daily.      . Cinnamon 500 MG capsule Take 1,000 mg by mouth daily.    .  clopidogrel (PLAVIX) 75 MG tablet Take 1 tablet (75 mg total) by mouth daily. 90 tablet 4  . gentamicin ointment (GARAMYCIN) 0.1 % Apply 1 application topically 3 (three) times daily. 15 g 0  . Glucosamine-Chondroit-Vit C-Mn (GLUCOSAMINE CHONDR 500 COMPLEX) CAPS Take 1,000 each by mouth daily.    [name] glucose blood (BAYER CONTOUR TEST) test strip Use to test blood sugar three times daily as instructed.  Dx code: E11.9 100 each 5  . HYDROcodone-acetaminophen (NORCO/VICODIN) 5-325 MG per tablet Take 1-2 tablets by mouth every 6 (six) hours as needed for moderate pain or severe pain. 90 tablet 0  . insulin NPH Human (NOVOLIN N RELION) 100 UNIT/ML injection Inject 0.35-0.4 mLs (35-40 Units total) into the skin 2 (two) times daily before a meal. 45 mL 0  . Insulin Pen Needle (NOVOFINE) 32G X 6 MM MISC Use three times daily 100 each 5  . insulin regular (NOVOLIN R,HUMULIN R) 100 units/mL injection Inject 110 units daily as avised (Patient taking differently: Inject 25-45 Units into the skin 3 (three) times daily before meals. 40 units in the morning, 25 units at lunch, 45 units at dinner) 40 mL 11  . Lancet Device MISC 1 application by Does not apply route as needed. 100 each 11  . losartan (COZAAR) 25 MG tablet take 1 tablet by mouth once daily 90 tablet 3  . magnesium gluconate (MAGONATE) 500 MG tablet Take 1,000 mg by mouth daily.    [name]  metoprolol succinate (TOPROL-XL) 50 MG 24 hr tablet take 1 tablet by mouth twice a day 60 tablet 5  . Multiple Vitamin (MULTIVITAMIN) capsule Take 1 capsule by mouth daily.      . Na Sulfate-K Sulfate-Mg Sulf SOLN Use as directed for colonoscopy prep 354 mL 0  . naloxegol oxalate (MOVANTIK) 25 MG TABS tablet Take 1 tablet (25 mg total) by mouth daily. 1 hour before a meal 30 tablet 2  . nitazoxanide (ALINIA) 500 MG tablet Take 1 tablet (500 mg total) by mouth 2 (two) times daily with a meal. 20 tablet 0  . nitroGLYCERIN (NITROSTAT) 0.4 MG SL tablet Place 1 tablet (0.4 mg  total) under the tongue every 5 (five) minutes as needed. 25 tablet 1  . Omega-3 Fatty Acids (FISH OIL PO) Take by mouth.    [name] omeprazole (PRILOSEC) 20 MG capsule Take 1 capsule (20 mg total) by mouth 2 (two) times daily. 20 capsule 0  . polyethylene glycol (MIRALAX) packet Take 17 g by mouth daily. 14 each 3  . RELION INSULIN SYRINGE 1ML/31G 31G X 5/16" 1 ML MISC USE THREE TIMES A DAY. 290 each 1  . sertraline (ZOLOFT) 100 MG tablet Take 1.5 tablets (150 mg total) by mouth daily. 45 tablet 2  . simvastatin (ZOCOR) 20 MG tablet Take 1 tablet (20 mg total) by mouth daily at 6 PM. 90 tablet 3  . tamsulosin (FLOMAX) 0.4 MG CAPS capsule take 1 capsule by mouth every morning 30 capsule 5  . tamsulosin (FLOMAX) 0.4 MG CAPS capsule Take 1 capsule (0.4 mg total) by mouth daily. 60 capsule 3   No current facility-administered medications on file prior to visit.     Past Medical History  Diagnosis Date  . Peripheral vascular disease   . Diabetes mellitus   . Mild aortic stenosis   . Tobacco use disorder     recently quit  . Depression   . Psoriasis   . Neuropathy   . Obesity   . Post splenectomy syndrome   . SOB (shortness of breath)   . Carotid artery occlusion   . Coronary artery disease   . Hyperlipidemia   . Hypertension   . Bell's palsy 2007  . Sleep apnea   . Angina at rest     chronic  . Cataract     Dr. Dawna Part  . Anxiety   . Arthritis   . DVT (deep venous thrombosis)     in leg  . Stroke   . Allergy   . Heart murmur   . Tubular adenoma 08/2013    Dr. Hilarie Fredrickson  . Adenomatous colon polyp   . Gastropathy   . Duodenitis   . Helicobacter pylori gastritis   . Diverticulosis      Past Surgical History  Procedure Laterality Date  . Splenectomy    . Heart stents  Jan 2011    leg stents 06/2009 and 03/2010  . Coronary artery bypass graft  09/06/2010    At [facility]: LIMA to LAD, left radial to RCA, sequential SVG to OM3 and 4  . Carotid endarterectomy  08/2010    left/ Dr.  Kellie Simmering  . Pta of illiac and sfa  multiple    Dr. Ronalee Belts, s/p revision 11/2012  . Colonoscopy    . Cardiac catheterization  08/2010    LAD: 80% ISR, RCA: 80% ostial, OM 80-90%  . Coronary angioplasty      LAD: before CABG     Family History  Problem Relation Age of Onset  . Lung cancer Father 84  . Breast cancer Sister 8  . Diabetes Paternal Grandfather   . Colon polyps Paternal Grandfather 59  . Dementia Mother   . Alzheimer's disease Mother      Social History   Social History  . Marital Status: Married    Spouse Name: N/A  . Number of Children: 0  . Years of Education: N/A   Occupational History  .     Social History Main Topics  . Smoking status: Current Some Day Smoker -- 0.50 packs/day for 40 years    Types: Cigarettes  . Smokeless tobacco: Never Used     Comment: smokes 2-3 cigarettes every 3 weeks  . Alcohol Use: No  . Drug Use: No  . Sexual Activity: No   Other Topics Concern  . Not on file   Social History Narrative     PHYSICAL EXAM   BP 140/70 mmHg  Pulse 93  Ht 5\' 7"  (1.702 m)  Wt 279 lb 8 oz (126.78 kg)  BMI 43.77 kg/m2 Constitutional: He is oriented to person, place, and time. He appears well-developed and well-nourished. No distress.  HENT: No nasal discharge.  Head: Normocephalic and atraumatic.  Eyes: Pupils are equal and round. Right eye exhibits no discharge. Left eye exhibits no discharge.  Neck: Normal range of motion. Neck supple. No JVD present. No thyromegaly present.  Cardiovascular: Normal rate, regular rhythm, diminished S2. Exam reveals no gallop and no friction rub. There is a 2/6 systolic ejection murmur at the aortic area. Pulmonary/Chest: Effort normal and breath sounds normal. No stridor. No respiratory distress. He has no wheezes. He has no rales. He exhibits no tenderness.  Abdominal: Soft. Bowel sounds are normal. He exhibits no distension. There is no tenderness. There is no rebound and no guarding.    Musculoskeletal: Normal range of motion. He exhibits trace edema and no tenderness.  Neurological: He is alert and oriented to person, place, and time. Coordination normal.  Skin: Skin is warm and dry. There is stasis dermatitis in the lower extremities. He is not diaphoretic. No erythema. No pallor.  Psychiatric: He has a normal mood and affect. His behavior is normal. Judgment and thought content normal.     [id number]  Rhythm  Low voltage -possible pulmonary disease.   ABNORMAL    ASSESSMENT AND PLAN

## 2015-03-18 NOTE — Assessment & Plan Note (Signed)
This was still mild on most recent echocardiogram last year.

## 2015-03-18 NOTE — Patient Instructions (Signed)
Medication Instructions:  Your physician recommends that you continue on your current medications as directed. Please refer to the Current Medication list given to you today.   Labwork: Your physician recommends that you have labs today: potassium   Testing/Procedures: Your physician has requested that you have a carotid duplex. This test is an ultrasound of the carotid arteries in your neck. It looks at blood flow through these arteries that supply the brain with blood. Allow one hour for this exam. There are no restrictions or special instructions.    Follow-Up: Your physician wants you to follow-up in: six months with Dr. Fletcher Anon. You will receive a reminder letter in the mail two months in advance. If you don't receive a letter, please call our office to schedule the follow-up appointment.   Any Other Special Instructions Will Be Listed Below (If Applicable).

## 2015-03-18 NOTE — Assessment & Plan Note (Signed)
I strongly advised him to quit smoking.

## 2015-03-18 NOTE — Assessment & Plan Note (Signed)
He is doing reasonably well with no symptoms suggestive of angina. Continue medical therapy. 

## 2015-03-18 NOTE — Assessment & Plan Note (Signed)
He had previous left carotid endarterectomy and mild disease on the right side. I requested a follow-up carotid Doppler.

## 2015-03-19 LAB — POTASSIUM: Potassium: 5.3 mmol/L — ABNORMAL HIGH (ref 3.5–5.2)

## 2015-03-22 ENCOUNTER — Other Ambulatory Visit: Payer: Self-pay

## 2015-03-22 DIAGNOSIS — N400 Enlarged prostate without lower urinary tract symptoms: Secondary | ICD-10-CM

## 2015-03-22 MED ORDER — TAMSULOSIN HCL 0.4 MG PO CAPS: ORAL_CAPSULE | ORAL | Status: DC

## 2015-03-23 ENCOUNTER — Ambulatory Visit (INDEPENDENT_AMBULATORY_CARE_PROVIDER_SITE_OTHER): Payer: Medicare Other | Admitting: Psychology

## 2015-03-23 DIAGNOSIS — F332 Major depressive disorder, recurrent severe without psychotic features: Secondary | ICD-10-CM

## 2015-03-23 DIAGNOSIS — F449 Dissociative and conversion disorder, unspecified: Secondary | ICD-10-CM | POA: Diagnosis not present

## 2015-03-29 ENCOUNTER — Encounter: Payer: Self-pay | Admitting: Psychiatry

## 2015-03-29 ENCOUNTER — Ambulatory Visit (INDEPENDENT_AMBULATORY_CARE_PROVIDER_SITE_OTHER): Payer: Medicare Other | Admitting: Psychiatry

## 2015-03-29 VITALS — BP 124/82 | HR 103 | Temp 97.2°F | Ht 68.0 in | Wt 271.8 lb

## 2015-03-29 DIAGNOSIS — I2510 Atherosclerotic heart disease of native coronary artery without angina pectoris: Secondary | ICD-10-CM | POA: Diagnosis not present

## 2015-03-29 DIAGNOSIS — F411 Generalized anxiety disorder: Secondary | ICD-10-CM

## 2015-03-29 NOTE — Progress Notes (Signed)
[facility] MD/PA/NP OP Progress Note  03/29/2015 9:28 AM [name] [name]  MRN:  [id number]  Subjective:  Patient returns for follow-up was generalized anxiety disorder and depression. He states overall things are going well. He states he feels like the medication has helped him. We discussed how his worries are being addressed with medication. He states overall things are better. He realizes that his anxiety and worries will not go away completely. He states that his appetite is good. He states he is sleeping well. He is happy that his insulin has been decreased and he is losing weight.  When asked about any issues with depression and he cites that can come any time, however he states it's under good control presently. He states that he does like to do things but he is limited by pain in his legs from neuropathy. He states one of his biggest fears and worries is that he is neuropathy her diabetes will worsen and he will no longer be able to drive. When asked what he would like to do if he were not physically limited he states he would like to get involved with horses. He states he's done this in the past and enjoyed it very much.  He discussed some family stress in that his wife's son, daughter-in-law and 49 year old child are living with them. He discussed some stress is the daughter-in-law cooks and often leaves the kitchen a mess.  He does feel the BuSpar and sertraline are addressing his depression and anxiety. He denies any side effects. Asian contacted this Probation officer by telephone on 03/17/2015 indicating that his anxiety was getting worse. At that time and instructed him to increase his sertraline from 100 mg 2050 mg and he states today that this has helped his anxiety. Chief Complaint: Worry about my health Chief Complaint    Follow-up; Medication Refill; Anxiety; Depression     Visit Diagnosis:     ICD-9-CM ICD-10-CM   1. GAD (generalized anxiety disorder) 300.02 F41.1     Past Medical History:   Past Medical History  Diagnosis Date  . Peripheral vascular disease   . Diabetes mellitus   . Mild aortic stenosis   . Tobacco use disorder     recently quit  . Depression   . Psoriasis   . Neuropathy   . Obesity   . Post splenectomy syndrome   . SOB (shortness of breath)   . Carotid artery occlusion   . Coronary artery disease   . Hyperlipidemia   . Hypertension   . Bell's palsy 2007  . Sleep apnea   . Angina at rest     chronic  . Cataract     Dr. Dawna Part  . Anxiety   . Arthritis   . DVT (deep venous thrombosis)     in leg  . Stroke   . Allergy   . Heart murmur   . Tubular adenoma 08/2013    Dr. Hilarie Fredrickson  . Adenomatous colon polyp   . Gastropathy   . Duodenitis   . Helicobacter pylori gastritis   . Diverticulosis     Past Surgical History  Procedure Laterality Date  . Splenectomy    . Heart stents  Jan 2011    leg stents 06/2009 and 03/2010  . Coronary artery bypass graft  09/06/2010    At [facility]: LIMA to LAD, left radial to RCA, sequential SVG to OM3 and 4  . Carotid endarterectomy  08/2010    left/ Dr. Kellie Simmering  . Pta of illiac  and sfa  multiple    Dr. Ronalee Belts, s/p revision 11/2012  . Colonoscopy    . Cardiac catheterization  08/2010    LAD: 80% ISR, RCA: 80% ostial, OM 80-90%  . Coronary angioplasty      LAD: before CABG   Family History:  Family History  Problem Relation Age of Onset  . Lung cancer Father 12  . Breast cancer Sister 37  . Diabetes Paternal Grandfather   . Colon polyps Paternal Grandfather 45  . Dementia Mother   . Alzheimer's disease Mother    Social History:  Social History   Social History  . Marital Status: Married    Spouse Name: N/A  . Number of Children: 0  . Years of Education: N/A   Occupational History  .     Social History Main Topics  . Smoking status: Current Some Day Smoker -- 0.50 packs/day for 40 years    Types: Cigarettes  . Smokeless tobacco: Never Used     Comment: smokes 2-3 cigarettes every 3 weeks  .  Alcohol Use: No  . Drug Use: No  . Sexual Activity: No   Other Topics Concern  . None   Social History Narrative   Additional History:   Assessment:   Musculoskeletal: Strength & Muscle Tone: within normal limits Gait & Station: unsteady walks with cane Patient leans: N/A  Psychiatric Specialty Exam: Anxiety Symptoms include nervous/anxious behavior (but under control with the current medications.). Patient reports no insomnia or suicidal ideas.    Depression        Associated symptoms include does not have insomnia and no suicidal ideas.  Past medical history includes anxiety.     Review of Systems  Psychiatric/Behavioral: Negative for depression, suicidal ideas, hallucinations, memory loss and substance abuse. The patient is nervous/anxious (but under control with the current medications.). The patient does not have insomnia.     Blood pressure 124/82, pulse 103, temperature 97.2 F (36.2 C), temperature source Tympanic, height 5\' 8"  (1.727 m), weight 271 lb 12.8 oz (123.288 kg), SpO2 95 %.Body mass index is 41.34 kg/(m^2).  General Appearance: Well Groomed  Eye Contact:  Good  Speech:  Normal Rate  Volume:  Normal  Mood:  Good  Affect:  Congruent  Thought Process:  Circumstantial  Orientation:  Full (Time, Place, and Person)  Thought Content:  Negative  Suicidal Thoughts:  No  Homicidal Thoughts:  No  Memory:  Immediate;   Good Recent;   Good Remote;   Good  Judgement:  Good  Insight:  Good  Psychomotor Activity:  Negative  Concentration:  Good  Recall:  Good  Fund of Knowledge: Good  Language: Good  Akathisia:  Negative  Handed:  Right  AIMS (if indicated):  N/A  Assets:  Communication Skills Desire for Improvement Social Support  ADL's:  Intact  Cognition: WNL  Sleep:  Good    Is the patient at risk to self?  No. Has the patient been a risk to self in the past 6 months?  No. Has the patient been a risk to self within the distant past?  No. Is the  patient a risk to others?  No. Has the patient been a risk to others in the past 6 months?  No. Has the patient been a risk to others within the distant past?  No.  Current Medications: Current Outpatient Prescriptions  Medication Sig Dispense Refill  . albuterol (PROVENTIL HFA;VENTOLIN HFA) 108 (90 BASE) MCG/ACT inhaler Inhale 2 puffs into  the lungs every 4 (four) hours as needed for wheezing or shortness of breath. 1 Inhaler 6  . aspirin 81 MG tablet Take 81 mg by mouth daily.    [name] BAYER MICROLET LANCETS lancets use as directed three times a day Dx: E11.9 100 each 12  . busPIRone (BUSPAR) 10 MG tablet Take 2 tablets (20 mg total) by mouth 2 (two) times daily. 120 tablet 1  . busPIRone (BUSPAR) 15 MG tablet   0  . cholecalciferol (VITAMIN D) 1000 UNITS tablet Take 1,000 Units by mouth daily.      . Cinnamon 500 MG capsule Take 1,000 mg by mouth daily.    . clopidogrel (PLAVIX) 75 MG tablet Take 1 tablet (75 mg total) by mouth daily. 90 tablet 4  . gentamicin ointment (GARAMYCIN) 0.1 % Apply 1 application topically 3 (three) times daily. 15 g 0  . Glucosamine-Chondroit-Vit C-Mn (GLUCOSAMINE CHONDR 500 COMPLEX) CAPS Take 1,000 each by mouth daily.    [name] glucose blood (BAYER CONTOUR TEST) test strip Use to test blood sugar three times daily as instructed.  Dx code: E11.9 100 each 5  . HYDROcodone-acetaminophen (NORCO/VICODIN) 5-325 MG per tablet Take 1-2 tablets by mouth every 6 (six) hours as needed for moderate pain or severe pain. 90 tablet 0  . insulin NPH Human (NOVOLIN N RELION) 100 UNIT/ML injection Inject 0.35-0.4 mLs (35-40 Units total) into the skin 2 (two) times daily before a meal. 45 mL 0  . Insulin Pen Needle (NOVOFINE) 32G X 6 MM MISC Use three times daily 100 each 5  . insulin regular (NOVOLIN R,HUMULIN R) 100 units/mL injection Inject 110 units daily as avised (Patient taking differently: Inject 25-45 Units into the skin 3 (three) times daily before meals. 40 units in the morning,  25 units at lunch, 45 units at dinner) 40 mL 11  . Lancet Device MISC 1 application by Does not apply route as needed. 100 each 11  . losartan (COZAAR) 25 MG tablet take 1 tablet by mouth once daily 90 tablet 3  . magnesium gluconate (MAGONATE) 500 MG tablet Take 1,000 mg by mouth daily.    . metoprolol succinate (TOPROL-XL) 50 MG 24 hr tablet take 1 tablet by mouth twice a day 60 tablet 5  . Multiple Vitamin (MULTIVITAMIN) capsule Take 1 capsule by mouth daily.      . Na Sulfate-K Sulfate-Mg Sulf SOLN Use as directed for colonoscopy prep 354 mL 0  . naloxegol oxalate (MOVANTIK) 25 MG TABS tablet Take 1 tablet (25 mg total) by mouth daily. 1 hour before a meal 30 tablet 2  . nitazoxanide (ALINIA) 500 MG tablet Take 1 tablet (500 mg total) by mouth 2 (two) times daily with a meal. 20 tablet 0  . nitroGLYCERIN (NITROSTAT) 0.4 MG SL tablet Place 1 tablet (0.4 mg total) under the tongue every 5 (five) minutes as needed. 25 tablet 1  . Omega-3 Fatty Acids (FISH OIL PO) Take by mouth.    [name] omeprazole (PRILOSEC) 20 MG capsule Take 1 capsule (20 mg total) by mouth 2 (two) times daily. 20 capsule 0  . polyethylene glycol (MIRALAX) packet Take 17 g by mouth daily. 14 each 3  . RELION INSULIN SYRINGE 1ML/31G 31G X 5/16" 1 ML MISC USE THREE TIMES A DAY. 290 each 1  . sertraline (ZOLOFT) 100 MG tablet Take 1.5 tablets (150 mg total) by mouth daily. 45 tablet 2  . simvastatin (ZOCOR) 20 MG tablet Take 1 tablet (20 mg total) by  mouth daily at 6 PM. 90 tablet 3  . tamsulosin (FLOMAX) 0.4 MG CAPS capsule Take 1 capsule (0.4 mg total) by mouth daily. 60 capsule 3  . tamsulosin (FLOMAX) 0.4 MG CAPS capsule take 2 capsule by mouth daily (Patient not taking: Reported on 03/29/2015) 60 capsule 5   No current facility-administered medications for this visit.    Medical Decision Making:  Established Problem, Stable/Improving (1) and Review of New Medication or Change in Dosage (2)  Treatment Plan Summary:Medication  management and Plan We will continue the sertraline at 150 mg daily. We'll continue his BuSpar to 20 mg twice daily.  Patient will follow up in 1 month. He's been encouraged call any questions or concerns prior to his next appointment. Should have enough of his medications until the next appointment. He is aware he can call if he needs any refills.   Faith Rogue 03/29/2015, 9:28 AM

## 2015-03-30 ENCOUNTER — Ambulatory Visit (INDEPENDENT_AMBULATORY_CARE_PROVIDER_SITE_OTHER): Payer: Medicare Other | Admitting: Psychology

## 2015-03-30 DIAGNOSIS — F332 Major depressive disorder, recurrent severe without psychotic features: Secondary | ICD-10-CM

## 2015-03-30 DIAGNOSIS — F449 Dissociative and conversion disorder, unspecified: Secondary | ICD-10-CM

## 2015-04-01 ENCOUNTER — Ambulatory Visit (INDEPENDENT_AMBULATORY_CARE_PROVIDER_SITE_OTHER): Payer: Medicare Other

## 2015-04-01 ENCOUNTER — Other Ambulatory Visit: Payer: Self-pay | Admitting: Cardiovascular Disease

## 2015-04-01 DIAGNOSIS — I779 Disorder of arteries and arterioles, unspecified: Secondary | ICD-10-CM

## 2015-04-01 DIAGNOSIS — I739 Peripheral vascular disease, unspecified: Principal | ICD-10-CM

## 2015-04-04 ENCOUNTER — Telehealth: Payer: Self-pay

## 2015-04-04 NOTE — Telephone Encounter (Signed)
Pt would like lab results. Please call. 

## 2015-04-04 NOTE — Telephone Encounter (Signed)
S/w pt who was checking to see if US carotid results back. Dr. Fletcher Anon out of office and will return Tuesday.  Will notify pt with results as soon as Dr. Fletcher Anon reviews. Pt verbalized understanding, thanked [place] for the call

## 2015-04-05 ENCOUNTER — Telehealth: Payer: Self-pay

## 2015-04-05 NOTE — Telephone Encounter (Signed)
Pt would like doppler results please call.

## 2015-04-06 ENCOUNTER — Ambulatory Visit (INDEPENDENT_AMBULATORY_CARE_PROVIDER_SITE_OTHER): Payer: Medicare Other | Admitting: Psychology

## 2015-04-06 DIAGNOSIS — F332 Major depressive disorder, recurrent severe without psychotic features: Secondary | ICD-10-CM

## 2015-04-06 DIAGNOSIS — F449 Dissociative and conversion disorder, unspecified: Secondary | ICD-10-CM | POA: Diagnosis not present

## 2015-04-06 NOTE — Telephone Encounter (Signed)
Left message on pt VM to CB. Will call pt with results after Dr. Fletcher Anon reviews

## 2015-04-07 ENCOUNTER — Other Ambulatory Visit: Payer: Self-pay | Admitting: Internal Medicine

## 2015-04-07 NOTE — Telephone Encounter (Signed)
Reviewed results w/pt See carotid result note

## 2015-04-12 ENCOUNTER — Other Ambulatory Visit: Payer: Self-pay | Admitting: Internal Medicine

## 2015-04-13 ENCOUNTER — Ambulatory Visit (INDEPENDENT_AMBULATORY_CARE_PROVIDER_SITE_OTHER): Payer: Medicare Other | Admitting: Psychology

## 2015-04-13 ENCOUNTER — Other Ambulatory Visit: Payer: Self-pay | Admitting: Internal Medicine

## 2015-04-13 DIAGNOSIS — F449 Dissociative and conversion disorder, unspecified: Secondary | ICD-10-CM

## 2015-04-13 DIAGNOSIS — F332 Major depressive disorder, recurrent severe without psychotic features: Secondary | ICD-10-CM | POA: Diagnosis not present

## 2015-04-13 NOTE — Telephone Encounter (Signed)
Having need medication Novolin/ N cloudy insulin fax to [facility] in [place]

## 2015-04-14 ENCOUNTER — Other Ambulatory Visit: Payer: Self-pay | Admitting: *Deleted

## 2015-04-14 DIAGNOSIS — B354 Tinea corporis: Secondary | ICD-10-CM | POA: Diagnosis not present

## 2015-04-14 DIAGNOSIS — B356 Tinea cruris: Secondary | ICD-10-CM | POA: Diagnosis not present

## 2015-04-14 DIAGNOSIS — L400 Psoriasis vulgaris: Secondary | ICD-10-CM | POA: Diagnosis not present

## 2015-04-14 DIAGNOSIS — R21 Rash and other nonspecific skin eruption: Secondary | ICD-10-CM | POA: Diagnosis not present

## 2015-04-14 MED ORDER — NOVOLIN N 100 UNIT/ML ~~LOC~~ SUSP: SUBCUTANEOUS | Status: DC

## 2015-04-14 NOTE — Telephone Encounter (Signed)
[name] [name] calling  from [facility] in [place] stated that patient need Novolin N, not Novolin R faxed to [facility] in [place]

## 2015-04-20 ENCOUNTER — Ambulatory Visit: Payer: Self-pay | Admitting: Psychology

## 2015-04-21 ENCOUNTER — Encounter: Payer: Self-pay | Admitting: Internal Medicine

## 2015-04-21 LAB — HM DIABETES EYE EXAM

## 2015-04-25 DIAGNOSIS — E1142 Type 2 diabetes mellitus with diabetic polyneuropathy: Secondary | ICD-10-CM | POA: Diagnosis not present

## 2015-04-25 DIAGNOSIS — B351 Tinea unguium: Secondary | ICD-10-CM | POA: Diagnosis not present

## 2015-04-26 ENCOUNTER — Encounter: Payer: Self-pay | Admitting: Psychiatry

## 2015-04-26 ENCOUNTER — Ambulatory Visit (INDEPENDENT_AMBULATORY_CARE_PROVIDER_SITE_OTHER): Payer: Medicare Other | Admitting: Psychiatry

## 2015-04-26 VITALS — BP 124/80 | HR 90 | Temp 97.8°F | Ht 68.0 in | Wt 266.4 lb

## 2015-04-26 DIAGNOSIS — F419 Anxiety disorder, unspecified: Secondary | ICD-10-CM

## 2015-04-26 DIAGNOSIS — I2510 Atherosclerotic heart disease of native coronary artery without angina pectoris: Secondary | ICD-10-CM

## 2015-04-26 MED ORDER — BUSPIRONE HCL 10 MG PO TABS: 20.0000 mg | ORAL_TABLET | Freq: Two times a day (BID) | ORAL | Status: DC

## 2015-04-26 MED ORDER — SERTRALINE HCL 100 MG PO TABS: 150.0000 mg | ORAL_TABLET | Freq: Every day | ORAL | Status: DC

## 2015-04-26 NOTE — Progress Notes (Signed)
BH MD/PA/NP OP Progress Note  04/26/2015 10:58 AM [name] [name]  MRN:  [id number]  Subjective:  Patient returns for follow-up was generalized anxiety disorder and depression. He states that overall things are going well. He states the medications have helped his mood. He states he really has not been experiencing much depression or anxiety. He denies any side effects from medications.  States his main issue seemed to be some social issues. He states that his son, daughter-in-law and his grandson are in his home. He states there are some issues with bad behavior on the part of the grandson and that approximately 2 weeks ago the grandson called patient's wife a bitch. He states he got very irritable then and grabbed the grandson and his wife called his attention to stay calm and patient did. States however there've been numerous behaviors before which she believes led up to his frustration at that time.   He indicates he continues to enjoy music and does use music as a way of coping with things when he gets frustrated or anxious. He states is also pleased in that he's been losing weight.  Chief Complaint: grandson Chief Complaint    Medication Refill; Follow-up     Visit Diagnosis:     ICD-9-CM ICD-10-CM   1. Anxiety 300.00 F41.9 sertraline (ZOLOFT) 100 MG tablet    Past Medical History:  Past Medical History  Diagnosis Date  . Peripheral vascular disease ([place])   . Diabetes mellitus   . Mild aortic stenosis   . Tobacco use disorder     recently quit  . Depression   . Psoriasis   . Neuropathy ([place])   . Obesity   . Post splenectomy syndrome   . SOB (shortness of breath)   . Carotid artery occlusion   . Coronary artery disease   . Hyperlipidemia   . Hypertension   . Bell's palsy 2007  . Sleep apnea   . Angina at [facility])     chronic  . Cataract     Dr. Dawna Part  . Anxiety   . Arthritis   . DVT (deep venous thrombosis) ([facility])     in leg  . Stroke ([place])   . Allergy   .  Heart murmur   . Tubular adenoma 08/2013    Dr. Hilarie Fredrickson  . Adenomatous colon polyp   . Gastropathy   . Duodenitis   . Helicobacter pylori gastritis   . Diverticulosis     Past Surgical History  Procedure Laterality Date  . Splenectomy    . Heart stents  Jan 2011    leg stents 06/2009 and 03/2010  . Coronary artery bypass graft  09/06/2010    At [facility]: LIMA to LAD, left radial to RCA, sequential SVG to OM3 and 4  . Carotid endarterectomy  08/2010    left/ Dr. Kellie Simmering  . Pta of illiac and sfa  multiple    Dr. Ronalee Belts, s/p revision 11/2012  . Colonoscopy    . Cardiac catheterization  08/2010    LAD: 80% ISR, RCA: 80% ostial, OM 80-90%  . Coronary angioplasty      LAD: before CABG   Family History:  Family History  Problem Relation Age of Onset  . Lung cancer Father 102  . Breast cancer Sister 57  . Diabetes Paternal Grandfather   . Colon polyps Paternal Grandfather 47  . Dementia Mother   . Alzheimer's disease Mother    Social History:  Social History   Social History  .  Marital Status: Married    Spouse Name: N/A  . Number of Children: 0  . Years of Education: N/A   Occupational History  .     Social History Main Topics  . Smoking status: Current Some Day Smoker -- 0.50 packs/day for 40 years    Types: Cigarettes  . Smokeless tobacco: Never Used     Comment: smokes 2-3 cigarettes every 3 weeks  . Alcohol Use: No  . Drug Use: No  . Sexual Activity: No   Other Topics Concern  . None   Social History Narrative   Additional History:   Assessment:   Musculoskeletal: Strength & Muscle Tone: within normal limits Gait & Station: unsteady walks with cane Patient leans: N/A  Psychiatric Specialty Exam: Anxiety Patient reports no insomnia, nervous/anxious behavior (but under control with the current medications.) or suicidal ideas.    Depression        Associated symptoms include does not have insomnia and no suicidal ideas.  Past medical history includes  anxiety.     Review of Systems  Neurological: Positive for tingling (he does report he has neuropathy and has had this for a long time).  Psychiatric/Behavioral: Negative for depression, suicidal ideas, hallucinations, memory loss and substance abuse. The patient is not nervous/anxious (but under control with the current medications.) and does not have insomnia.   All other systems reviewed and are negative.   Blood pressure 124/80, pulse 90, temperature 97.8 F (36.6 C), temperature source Tympanic, height 5\' 8"  (1.727 m), weight 266 lb 6.4 oz (120.838 kg), SpO2 95 %.Body mass index is 40.52 kg/(m^2).  General Appearance: Well Groomed  Eye Contact:  Good  Speech:  Normal Rate  Volume:  Normal  Mood:  Good  Affect:  Congruent  Thought Process:  Circumstantial  Orientation:  Full (Time, Place, and Person)  Thought Content:  Negative  Suicidal Thoughts:  No  Homicidal Thoughts:  No  Memory:  Immediate;   Good Recent;   Good Remote;   Good  Judgement:  Good  Insight:  Good  Psychomotor Activity:  Negative  Concentration:  Good  Recall:  Good  Fund of Knowledge: Good  Language: Good  Akathisia:  Negative  Handed:  Right  AIMS (if indicated):  N/A  Assets:  Communication Skills Desire for Improvement Social Support  ADL's:  Intact  Cognition: WNL  Sleep:  Good    Is the patient at risk to self?  No. Has the patient been a risk to self in the past 6 months?  No. Has the patient been a risk to self within the distant past?  No. Is the patient a risk to others?  No. Has the patient been a risk to others in the past 6 months?  No. Has the patient been a risk to others within the distant past?  No.  Current Medications: Current Outpatient Prescriptions  Medication Sig Dispense Refill  . albuterol (PROVENTIL HFA;VENTOLIN HFA) 108 (90 BASE) MCG/ACT inhaler Inhale 2 puffs into the lungs every 4 (four) hours as needed for wheezing or shortness of breath. 1 Inhaler 6  . aspirin 81  MG tablet Take 81 mg by mouth daily.    [name] BAYER MICROLET LANCETS lancets use as directed three times a day Dx: E11.9 100 each 12  . busPIRone (BUSPAR) 10 MG tablet Take 2 tablets (20 mg total) by mouth 2 (two) times daily. 120 tablet 3  . cholecalciferol (VITAMIN D) 1000 UNITS tablet Take 1,000 Units by mouth  daily.      . Cinnamon 500 MG capsule Take 1,000 mg by mouth daily.    . clopidogrel (PLAVIX) 75 MG tablet Take 1 tablet (75 mg total) by mouth daily. 90 tablet 4  . finasteride (PROSCAR) 5 MG tablet   0  . fluconazole (DIFLUCAN) 150 MG tablet Take 150 mg by mouth daily.  0  . gentamicin ointment (GARAMYCIN) 0.1 % Apply 1 application topically 3 (three) times daily. 15 g 0  . Glucosamine-Chondroit-Vit C-Mn (GLUCOSAMINE CHONDR 500 COMPLEX) CAPS Take 1,000 each by mouth daily.    [name] glucose blood (BAYER CONTOUR TEST) test strip Use to test blood sugar three times daily as instructed.  Dx code: E11.9 100 each 5  . HYDROcodone-acetaminophen (NORCO/VICODIN) 5-325 MG per tablet Take 1-2 tablets by mouth every 6 (six) hours as needed for moderate pain or severe pain. 90 tablet 0  . Insulin Pen Needle (NOVOFINE) 32G X 6 MM MISC Use three times daily 100 each 5  . insulin regular (NOVOLIN R) 100 units/mL injection Inject 0.35-0.4 mLs (35-40 Units total) into the skin 3 (three) times daily before meals. 40 mL 1  . insulin regular (NOVOLIN R) 100 units/mL injection Inject into the skin 40 units before breakfast, 25 units before lunch and 40 units before dinner. 40 mL 2  . insulin regular (NOVOLIN R,HUMULIN R) 100 units/mL injection Inject 110 units daily as avised (Patient taking differently: Inject 25-45 Units into the skin 3 (three) times daily before meals. 40 units in the morning, 25 units at lunch, 45 units at dinner) 40 mL 11  . ketoconazole (NIZORAL) 2 % cream apply to affected area at bedtime  0  . Lancet Device MISC 1 application by Does not apply route as needed. 100 each 11  . losartan (COZAAR)  25 MG tablet take 1 tablet by mouth once daily 90 tablet 3  . magnesium gluconate (MAGONATE) 500 MG tablet Take 1,000 mg by mouth daily.    . metoprolol succinate (TOPROL-XL) 50 MG 24 hr tablet take 1 tablet by mouth twice a day 60 tablet 5  . Multiple Vitamin (MULTIVITAMIN) capsule Take 1 capsule by mouth daily.      . Na Sulfate-K Sulfate-Mg Sulf SOLN Use as directed for colonoscopy prep 354 mL 0  . naloxegol oxalate (MOVANTIK) 25 MG TABS tablet Take 1 tablet (25 mg total) by mouth daily. 1 hour before a meal 30 tablet 2  . nitazoxanide (ALINIA) 500 MG tablet Take 1 tablet (500 mg total) by mouth 2 (two) times daily with a meal. 20 tablet 0  . nitroGLYCERIN (NITROSTAT) 0.4 MG SL tablet Place 1 tablet (0.4 mg total) under the tongue every 5 (five) minutes as needed. 25 tablet 1  . NOVOLIN N 100 UNIT/ML injection Inject under the skin 40 units before breakfast and 35 units before dinner as instructed. 30 mL 1  . Omega-3 Fatty Acids (FISH OIL PO) Take by mouth.    [name] omeprazole (PRILOSEC) 20 MG capsule Take 1 capsule (20 mg total) by mouth 2 (two) times daily. 20 capsule 0  . polyethylene glycol (MIRALAX) packet Take 17 g by mouth daily. 14 each 3  . RELION INSULIN SYRINGE 1ML/31G 31G X 5/16" 1 ML MISC USE THREE TIMES A DAY. 290 each 1  . sertraline (ZOLOFT) 100 MG tablet Take 1.5 tablets (150 mg total) by mouth daily. 45 tablet 3  . simvastatin (ZOCOR) 20 MG tablet Take 1 tablet (20 mg total) by mouth daily at  6 PM. 90 tablet 3  . tamsulosin (FLOMAX) 0.4 MG CAPS capsule Take 1 capsule (0.4 mg total) by mouth daily. 60 capsule 3  . tamsulosin (FLOMAX) 0.4 MG CAPS capsule take 2 capsule by mouth daily 60 capsule 5  . triamcinolone ointment (KENALOG) 0.1 % apply to affected area twice a day AVOID FACE, GROIN AND UNDERARMS  0  . buPROPion (WELLBUTRIN XL) 150 MG 24 hr tablet Take 150 mg by mouth daily.  0   No current facility-administered medications for this visit.    Medical Decision Making:   Established Problem, Stable/Improving (1) and Review of New Medication or Change in Dosage (2)  Treatment Plan Summary:Medication management and Plan   Depressive disorder, moderate, recurrent-stable We will continue the sertraline at 150 mg daily.  Generalized anxiety disorder-stable We'll continue his BuSpar to 20 mg twice daily.  Patient will follow up in 2 month. He's been encouraged call any questions or concerns prior to his next appointment.    Faith Rogue 04/26/2015, 10:58 AM

## 2015-04-27 ENCOUNTER — Telehealth: Payer: Self-pay | Admitting: Internal Medicine

## 2015-04-27 ENCOUNTER — Ambulatory Visit: Payer: Medicare Other | Admitting: Psychology

## 2015-04-27 NOTE — Telephone Encounter (Signed)
Pt states he has had a fungal infection on his bottom and had some blistering there. He has been using medication on the area and it is better and has cleared some. Pt wants to know if it is ok to proceed with ECL tomorrow. Discussed with pt that it probably was not an issue but would check with Dr. Hilarie Fredrickson to make sure. Please advise.

## 2015-04-27 NOTE — Telephone Encounter (Signed)
Ok to proceed, hopefully he has started the prep instructions

## 2015-04-27 NOTE — Telephone Encounter (Signed)
Pt aware.

## 2015-04-28 ENCOUNTER — Encounter: Payer: Self-pay | Admitting: Internal Medicine

## 2015-04-28 ENCOUNTER — Ambulatory Visit (AMBULATORY_SURGERY_CENTER): Payer: Medicare Other | Admitting: Internal Medicine

## 2015-04-28 VITALS — BP 120/69 | HR 80 | Temp 96.7°F | Resp 16 | Ht 68.0 in | Wt 279.0 lb

## 2015-04-28 DIAGNOSIS — Z860100 Personal history of colon polyps, unspecified: Secondary | ICD-10-CM

## 2015-04-28 DIAGNOSIS — D122 Benign neoplasm of ascending colon: Secondary | ICD-10-CM | POA: Diagnosis not present

## 2015-04-28 DIAGNOSIS — R1310 Dysphagia, unspecified: Secondary | ICD-10-CM | POA: Diagnosis not present

## 2015-04-28 DIAGNOSIS — D123 Benign neoplasm of transverse colon: Secondary | ICD-10-CM | POA: Diagnosis not present

## 2015-04-28 DIAGNOSIS — D125 Benign neoplasm of sigmoid colon: Secondary | ICD-10-CM | POA: Diagnosis not present

## 2015-04-28 DIAGNOSIS — K621 Rectal polyp: Secondary | ICD-10-CM | POA: Diagnosis not present

## 2015-04-28 DIAGNOSIS — E119 Type 2 diabetes mellitus without complications: Secondary | ICD-10-CM | POA: Diagnosis not present

## 2015-04-28 DIAGNOSIS — I10 Essential (primary) hypertension: Secondary | ICD-10-CM | POA: Diagnosis not present

## 2015-04-28 DIAGNOSIS — D128 Benign neoplasm of rectum: Secondary | ICD-10-CM | POA: Diagnosis not present

## 2015-04-28 LAB — GLUCOSE, CAPILLARY
Glucose-Capillary: 153 mg/dL — ABNORMAL HIGH (ref 65–99)
Glucose-Capillary: 116 mg/dL — ABNORMAL HIGH (ref 65–99)

## 2015-04-28 MED ORDER — SODIUM CHLORIDE 0.9 % IV SOLN: 500.0000 mL | INTRAVENOUS | Status: DC

## 2015-04-28 NOTE — Progress Notes (Signed)
Called to room to assist during endoscopic procedure.  Patient ID and intended procedure confirmed with present staff. Received instructions for my participation in the procedure from the performing physician.  

## 2015-04-28 NOTE — Patient Instructions (Addendum)
HOLD YOUR PLAVIX FOR ADDITIONAL 5 DAYS, RESTART ON Tuesday October 11,2016.  AVOID ALL NSAIDS (MOTRIN, ALEVE, ADVIL, IBUPROFEN ETC FOR 2 WEEKS, May 12, 2015.   YOU HAD AN ENDOSCOPIC PROCEDURE TODAY AT [facility]:   Refer to the procedure report that was given to you for any specific questions about what was found during the examination.  If the procedure report does not answer your questions, please call your gastroenterologist to clarify.  If you requested that your care partner not be given the details of your procedure findings, then the procedure report has been included in a sealed envelope for you to review at your convenience later.  YOU SHOULD EXPECT: Some feelings of bloating in the abdomen. Passage of more gas than usual.  Walking can help get rid of the air that was put into your GI tract during the procedure and reduce the bloating. If you had a lower endoscopy (such as a colonoscopy or flexible sigmoidoscopy) you may notice spotting of blood in your stool or on the toilet paper. If you underwent a bowel prep for your procedure, you may not have a normal bowel movement for a few days.  Please Note:  You might notice some irritation and congestion in your nose or some drainage.  This is from the oxygen used during your procedure.  There is no need for concern and it should clear up in a day or so.  SYMPTOMS TO REPORT IMMEDIATELY:   Following lower endoscopy (colonoscopy or flexible sigmoidoscopy):  Excessive amounts of blood in the stool  Significant tenderness or worsening of abdominal pains  Swelling of the abdomen that is new, acute  Fever of 100F or higher   Following upper endoscopy (EGD)  Vomiting of blood or coffee ground material  New chest pain or pain under the shoulder blades  Painful or persistently difficult swallowing  New shortness of breath  Fever of 100F or higher  Black, tarry-looking stools  For urgent or emergent issues, a  gastroenterologist can be reached at any hour by calling [phone].   DIET:  NOTHING TO EAT OR DRINK UNTIL 11:15 11:15 UNTIL 12:15 ONLY CLEAR LIQUIDS. 12:15 UNTIL MORNING ONLY SOFT FOODS. RESUME YOUR REGULAR DIET IN AM.  ACTIVITY:  You should plan to take it easy for the rest of today and you should NOT DRIVE or use heavy machinery until tomorrow (because of the sedation medicines used during the test).    FOLLOW UP: Our staff will call the number listed on your records the next business day following your procedure to check on you and address any questions or concerns that you may have regarding the information given to you following your procedure. If we do not reach you, we will leave a message.  However, if you are feeling well and you are not experiencing any problems, there is no need to return our call.  We will assume that you have returned to your regular daily activities without incident.  If any biopsies were taken you will be contacted by phone or by letter within the next 1-3 weeks.  Please call us at [phone] if you have not heard about the biopsies in 3 weeks.    SIGNATURES/CONFIDENTIALITY: You and/or your care partner have signed paperwork which will be entered into your electronic medical record.  These signatures attest to the fact that that the information above on your After Visit Summary has been reviewed and is understood.  Full responsibility of the  confidentiality of this discharge information lies with you and/or your care-partner. 

## 2015-04-28 NOTE — Op Note (Signed)
[place]  [address] [place], [id number]   ENDOSCOPY PROCEDURE REPORT  PATIENT: [name] [name], [name] [name]  MR#: [id number] BIRTHDATE: 12/12/1955 , 59  yrs. old GENDER: male ENDOSCOPIST: Jerene Bears, MD PROCEDURE DATE:  04/28/2015 PROCEDURE:  EGD, diagnostic and EGD w/ wire guided (Savary) dilation  ASA CLASS:     Class III INDICATIONS:  dysphagia. MEDICATIONS: Monitored anesthesia care and Propofol 300 mg IV TOPICAL ANESTHETIC: none  DESCRIPTION OF PROCEDURE: After the risks benefits and alternatives of the procedure were thoroughly explained, informed consent was obtained.  The [id number] endoscope was introduced through the mouth and advanced to the second portion of the duodenum , Without limitations.  The instrument was slowly withdrawn as the mucosa was fully examined.   ESOPHAGUS: The mucosa of the esophagus appeared normal, though it was somewhat tortuous in the distal half.  Given symptoms, empiric dilation using a 63mm (51Fr) Savary dilator over guidewire was performed.  No resistance and no heme.  STOMACH: Gastropathy was found in the entire examined stomach. There was adherent blood present.   No ulcers.  Previously biopsies negative for H. pylori.  DUODENUM: The duodenal mucosa showed no abnormalities in the bulb and 2nd part of the duodenum.  Retroflexed views revealed no abnormalities.     The scope was then withdrawn from the patient and the procedure completed.  COMPLICATIONS: There were no immediate complications.  ENDOSCOPIC IMPRESSION: 1.   The mucosa of the esophagus appeared normal; empiric dilation to 78mm (51Fr) Savary over guidewire 2.   Gastropathy was found in the entire examined stomach 3.   The duodenal mucosa showed no abnormalities in the bulb and 2nd part of the duodenum  RECOMMENDATIONS: 1.  Continue current medications 2.  Avoid NSAIDs 3.  Proceed with a Colonoscopy.  eSigned:  Jerene Bears, MD 04/28/2015  10:18 AM   CC: the patient, PCP

## 2015-04-28 NOTE — Progress Notes (Signed)
Transferred to recovery room. A/O x3, pleased with MAC.  VSS.  Report to Karen, RN. 

## 2015-04-28 NOTE — Op Note (Signed)
[place]  [address]. [place], [id number]   COLONOSCOPY PROCEDURE REPORT  PATIENT: [name] [name], [name] [name]  MR#: [id number] BIRTHDATE: December 28, 1955 , 59  yrs. old GENDER: male ENDOSCOPIST: Jerene Bears, MD PROCEDURE DATE:  04/28/2015 PROCEDURE:   Colonoscopy, surveillance and Colonoscopy with snare polypectomy First Screening Colonoscopy - Avg.  risk and is 50 yrs.  old or older - No.  Prior Negative Screening - Now for repeat screening. N/A  History of Adenoma - Now for follow-up colonoscopy & has been > or = to 3 yrs.  No.  It has been less than 3 yrs since last colonoscopy.  Medical reason.  Polyps removed today? Yes ASA CLASS:   Class III INDICATIONS:Surveillance due to prior colonic neoplasia and PH Colon Adenoma. MEDICATIONS: Monitored anesthesia care, Propofol 500 mg IV, and this was the total dose used for all procedures at this session  DESCRIPTION OF PROCEDURE:   After the risks benefits and alternatives of the procedure were thoroughly explained, informed consent was obtained.  The digital rectal exam revealed no rectal mass.   The LB [id number]  endoscope was introduced through the anus and advanced to the cecum, which was identified by both the appendix and ileocecal valve. No adverse events experienced. The quality of the prep was fair clearing to good with copious irrigation and lavage.  (Suprep was used)  The instrument was then slowly withdrawn as the colon was fully examined. Estimated blood loss is zero unless otherwise noted in this procedure report.    COLON FINDINGS: Two sessile polyps ranging from 7 to 1mm in size were found in the ascending colon.  Polypectomies were performed using snare cautery (1) and with a cold snare (1).  The resection was complete, the polyp tissue was completely retrieved and sent to histology.   A sessile polyp measuring 6 mm in size was found in the proximal transverse colon.  A polypectomy was performed  using snare cautery.  The resection was complete, the polyp tissue was completely retrieved and sent to histology.   Three sessile polyps ranging between 3-22mm in size were found in the sigmoid colon. Polypectomies were performed with a cold snare.  The resection was complete, the polyp tissue was completely retrieved and sent to histology.   Two sessile polyps ranging between 3-53mm in size were found in the rectum.  Polypectomies were performed using snare cautery (1) and with a cold snare(1).  The resection was complete, the polyp tissue was completely retrieved and sent to histology. There was moderate diverticulosis noted in the ascending colon and left colon.  Retroflexed views revealed internal hemorrhoids. The time to cecum = 1.9 Withdrawal time = 27.8   The scope was withdrawn and the procedure completed. COMPLICATIONS: There were no immediate complications.  ENDOSCOPIC IMPRESSION: 1.   Two sessile polyps ranging from 7 to 24mm in size were found in the ascending colon; polypectomies were performed using snare cautery and with a cold snare 2.   Sessile polyp was found in the proximal transverse colon; polypectomy was performed using snare cautery 3.   Three sessile polyps ranging between 3-35mm in size were found in the sigmoid colon; polypectomies were performed with a cold snare 4.   Two sessile polyps ranging between 3-53mm in size were found in the rectum; polypectomies were performed using snare cautery and with a cold snare 5.   Moderate diverticulosis was noted in the ascending colon and left colon  RECOMMENDATIONS: 1.  Await pathology  results 2.  Avoid all NSAIDs for the next 2 weeks. 3.  Timing of repeat colonoscopy will be determined by pathology findings. 4.  You will receive a letter within 1-2 weeks with the results of your biopsy as well as final recommendations.  Please call my office if you have not received a letter after 3 weeks. 5.  Hold Plavix for an  additional 5 days given snare cautery polypectomy today  eSigned:  Jerene Bears, MD 04/28/2015 10:23 AM   cc:  the patient, PCP   PATIENT NAME:  [name] [name], [name] [name] MR#: [id number]

## 2015-04-29 ENCOUNTER — Encounter: Payer: Self-pay | Admitting: Internal Medicine

## 2015-04-29 ENCOUNTER — Telehealth: Payer: Self-pay | Admitting: *Deleted

## 2015-04-29 ENCOUNTER — Ambulatory Visit (INDEPENDENT_AMBULATORY_CARE_PROVIDER_SITE_OTHER): Payer: Medicare Other | Admitting: Internal Medicine

## 2015-04-29 VITALS — BP 124/70 | HR 80 | Temp 97.4°F | Resp 16 | Wt 268.6 lb

## 2015-04-29 DIAGNOSIS — I2510 Atherosclerotic heart disease of native coronary artery without angina pectoris: Secondary | ICD-10-CM

## 2015-04-29 DIAGNOSIS — E1159 Type 2 diabetes mellitus with other circulatory complications: Secondary | ICD-10-CM

## 2015-04-29 NOTE — Patient Instructions (Signed)
Please continue the insulin doses as follows:  Insulin Before breakfast Before lunch Before dinner  Regular (short acting) 40 25 40  NPH (long acting) 40  30   Please come back in 3 months with your sugar log.  Please let me know if the sugars are consistently <80 or >200.  KEEP UP THE GREAT WORK!

## 2015-04-29 NOTE — Progress Notes (Signed)
Patient ID: [name] [name], male   DOB: 07/05/1956, 59 y.o.   MRN: [id number]  HPI: [name] [name] is a 59 y.o.-year-old male, returning for f/u for DM2, dx 2006, insulin-dependent since 2011, uncontrolled, with complications (CAD - CABG 2012, PVD - s/p Stents, Aortic and carotid stenosis, peripheral neuropathy). Last visit 3 mo ago.  Last hemoglobin A1c: Lab Results  Component Value Date   HGBA1C 6.4 03/14/2015   HGBA1C 7.4* 12/10/2014   HGBA1C 6.9* 09/02/2014   Pt was on a regimen of: - Lantus 60 units bid >> 60 units in HS >> 80 units in HS - Novolog 14 >> 17 >> 25-25-30 We stopped Victoza at a previous visit. He could not afford his insulins (doughnut hole) - 400$.   He is now on: N and R insulin Insulin Before breakfast Before lunch Before dinner  Regular (short acting) 45 >> 40 25 45 >> 40  NPH (long acting) 35 >> 40  25 >> 30   Pt checks his sugars 3x a day and they are (reviewed log): - am: 86, 133-289 >> 95-180, 190 >> 76, 98-163, 180, 190 >> 132-144 >> 88-166, 196, 309 >> 89-154, 193 - 2h after b'fast: 198, 239 >> n/c >> 363, 377 >> 148 >> 139-148 >> n/c >> 161 >> n/c - before lunch: 106-207 >> 134-187, 192, 207 >> 108 >> 70, 79, 123-167, 220 >> 133, 179 - 2h after lunch: 104-180 >> 94, 115-189 >> 130-160, 215 >> 70, 101-170, 186 >> 93-179, 217 - before dinner: 120-150 >> 152-188 >> 95-150 >> 87, 95-172, 188 >> n/c >> 101-192 >> 122-147 - 2h after dinner:114-187, 272x1 >> 101-136 lately , 175 >> 89, 139-188 >> 83-135 >> 39 x1; 100-143 - bedtime: 265 >> n/c >> 127-135 >> 101-276 >> 225 >> n/c >> 106-129 >> 80-135 >> 101-154 No lows. Lowest sugar was 69 >> 39! (exertion, could not sleep for 2 nights) >> again 39; ? hypoglycemia awareness. Highest sugar was 377 >> 272 >> 224 >> 254 - few 200s >> 217.  Pt's meals are: - Breakfast: toast + eggs or cereals + milk - Lunch: soup + 1/2 sandwich - Dinner: meat + sweet potatoes or pasta + salad - Snacks: 3: sugar free foods,  fruit He quit drinking sodas, but drinks tea - artificial sweetener. He also started to drink Almond milk. Exercises by walking 2x a day.  - He has CKD, last BUN/creatinine:  Lab Results  Component Value Date   BUN 22 03/14/2015   CREATININE 1.50 03/14/2015  He is on Cozaar.Sees nephrology. Has a stone in L kidney, has 3 cysts in R kidney. - last set of lipids: Lab Results  Component Value Date   CHOL 115 03/14/2015   HDL 28.00* 03/14/2015   LDLCALC 56 03/14/2015   LDLDIRECT 64.9 06/01/2014   TRIG 157.0* 03/14/2015   CHOLHDL 4 03/14/2015  He  Is on Zocor. - last eye exam was in 08/2014 [facility] DR. + cataracts. - + numbness and tingling in his feet - [facility]) - Dr Elvina Mattes. He had a foot exam 01/11/2014.   He has pain in L leg >> surgery for PVD stents on 04/27/2014. His sugars improved after the stent.   I reviewed pt's medications, allergies, PMH, social hx, family hx, and changes were documented in the history of present illness. Otherwise, unchanged from my initial visit note.   ROS: Constitutional: + weight loss/+ gain, + fatigue, + feeling hot, +  nocturia, + poor sleep Eyes: no blurry vision, no xerophthalmia ENT: no sore throat, no nodules palpated in throat, + dysphagia/no odynophagia,no  hoarseness, + decreased hearing Cardiovascular: no CP/+ SOB/no palpitations/+ leg swelling Respiratory:+ cough/+ SOB Gastrointestinal: no N/V/D/+ C Musculoskeletal: + muscle aches/+ bone pain  Skin: no rash, + hair loss Neurological: no tremors/no numbness/tingling/dizziness, + HA + diff with erections, + low libido  PE: BP 124/70 mmHg  Pulse 80  Temp(Src) 97.4 F (36.3 C) (Oral)  Resp 16  Wt 268 lb 9.6 oz (121.836 kg)  SpO2 94% Body mass index is 40.85 kg/(m^2). Wt Readings from Last 3 Encounters:  04/29/15 268 lb 9.6 oz (121.836 kg)  04/28/15 279 lb (126.554 kg)  04/26/15 266 lb 6.4 oz (120.838 kg)   Constitutional: obese, in NAD, ruddy  complexion Eyes: PERRLA, EOMI, no exophthalmos ENT: dry mucous membranes, no thyromegaly, no cervical lymphadenopathy Cardiovascular: RRR, +2/6 SEM, no RG Respiratory: CTA B Gastrointestinal: abdomen soft, NT, ND, BS+ Musculoskeletal: no deformities, strength intact in all 4 Skin: moist, warm, + rash - rosacea on face  Neurological: no tremor with outstretched hands, DTR normal in all 4  ASSESSMENT: 1. DM2, insulin-dependent, uncontrolled, with complications - CAD - s/p CABG 2012 - PVD - s/p stents 2012 - Aortic stenosis - carotid stenosis - CKD - peripheral neuropathy   PLAN:  1. Patient with long-standing, uncontrolled diabetes, on injectable regimen. Diabetes has improved since last visit, and he continues to lose weight >> lost 12 lbs since last visit! - at last visit, he had some more lows but some highs before meals >> increased NPH and decreased the regular insulin. Sugars now most at goal! He had another 39 (unexplained!) - advised him to pay attention and see why this happens. Will not change regimen, though, as this was the only low CBG in last 3 mo. Patient Instructions   Please continue the insulin doses as follows:  Insulin Before breakfast Before lunch Before dinner  Regular (short acting) 40 25 40  NPH (long acting) 40  30   Please come back in 3 months with your sugar log.  Please let me know if the sugars are consistently <80 or >200.  KEEP UP THE GREAT WORK!  - continue to check 3 times a day, rotating checks - up to date with eye exams - reviewed HbA1c obtained by PCP >> 6.5% (great!) - Return to clinic in 3 mo with sugar log

## 2015-04-29 NOTE — Telephone Encounter (Signed)
  Follow up Call-  Call back number 04/28/2015 09/28/2013 09/15/2013  Post procedure Call Back phone  # [phone] [name]'s number [phone]  Permission to leave phone message Yes Yes Yes     Patient questions:  Do you have a fever, pain , or abdominal swelling? No. Pain Score  0 *  Have you tolerated food without any problems? Yes.    Have you been able to return to your normal activities? No.  Do you have any questions about your discharge instructions: Diet   No. Medications  No. Follow up visit  No.  Do you have questions or concerns about your Care? No.  Actions: * If pain score is 4 or above: No action needed, pain <4.

## 2015-05-03 ENCOUNTER — Encounter: Payer: Self-pay | Admitting: Internal Medicine

## 2015-05-11 ENCOUNTER — Ambulatory Visit (INDEPENDENT_AMBULATORY_CARE_PROVIDER_SITE_OTHER): Payer: Medicare Other | Admitting: Psychology

## 2015-05-11 DIAGNOSIS — F449 Dissociative and conversion disorder, unspecified: Secondary | ICD-10-CM

## 2015-05-11 DIAGNOSIS — F332 Major depressive disorder, recurrent severe without psychotic features: Secondary | ICD-10-CM | POA: Diagnosis not present

## 2015-05-17 DIAGNOSIS — E1122 Type 2 diabetes mellitus with diabetic chronic kidney disease: Secondary | ICD-10-CM | POA: Diagnosis not present

## 2015-05-17 DIAGNOSIS — R809 Proteinuria, unspecified: Secondary | ICD-10-CM | POA: Diagnosis not present

## 2015-05-17 DIAGNOSIS — E875 Hyperkalemia: Secondary | ICD-10-CM | POA: Diagnosis not present

## 2015-05-17 DIAGNOSIS — N183 Chronic kidney disease, stage 3 (moderate): Secondary | ICD-10-CM | POA: Diagnosis not present

## 2015-05-18 ENCOUNTER — Ambulatory Visit (INDEPENDENT_AMBULATORY_CARE_PROVIDER_SITE_OTHER): Payer: Medicare Other | Admitting: Psychology

## 2015-05-18 DIAGNOSIS — F332 Major depressive disorder, recurrent severe without psychotic features: Secondary | ICD-10-CM | POA: Diagnosis not present

## 2015-05-18 DIAGNOSIS — F449 Dissociative and conversion disorder, unspecified: Secondary | ICD-10-CM

## 2015-05-25 ENCOUNTER — Ambulatory Visit (INDEPENDENT_AMBULATORY_CARE_PROVIDER_SITE_OTHER): Payer: Medicare Other | Admitting: Psychology

## 2015-05-25 DIAGNOSIS — F4310 Post-traumatic stress disorder, unspecified: Secondary | ICD-10-CM

## 2015-06-01 ENCOUNTER — Ambulatory Visit (INDEPENDENT_AMBULATORY_CARE_PROVIDER_SITE_OTHER): Payer: Medicare Other | Admitting: Psychology

## 2015-06-01 DIAGNOSIS — F449 Dissociative and conversion disorder, unspecified: Secondary | ICD-10-CM

## 2015-06-01 DIAGNOSIS — F332 Major depressive disorder, recurrent severe without psychotic features: Secondary | ICD-10-CM

## 2015-06-08 ENCOUNTER — Other Ambulatory Visit: Payer: Self-pay | Admitting: Cardiovascular Disease

## 2015-06-13 ENCOUNTER — Encounter: Payer: Self-pay | Admitting: Urology

## 2015-06-13 ENCOUNTER — Ambulatory Visit (INDEPENDENT_AMBULATORY_CARE_PROVIDER_SITE_OTHER): Payer: Medicare Other | Admitting: Urology

## 2015-06-13 VITALS — BP 130/70 | HR 81 | Ht 66.0 in | Wt 266.3 lb

## 2015-06-13 DIAGNOSIS — R3912 Poor urinary stream: Secondary | ICD-10-CM | POA: Diagnosis not present

## 2015-06-13 DIAGNOSIS — N400 Enlarged prostate without lower urinary tract symptoms: Secondary | ICD-10-CM

## 2015-06-13 DIAGNOSIS — I2510 Atherosclerotic heart disease of native coronary artery without angina pectoris: Secondary | ICD-10-CM | POA: Diagnosis not present

## 2015-06-13 LAB — URINALYSIS, COMPLETE
Bilirubin, UA: NEGATIVE
Glucose, UA: NEGATIVE
Ketones, UA: NEGATIVE
Leukocytes, UA: NEGATIVE
Nitrite, UA: NEGATIVE
Specific Gravity, UA: 1.025 (ref 1.005–1.030)
Urobilinogen, Ur: 0.2 mg/dL (ref 0.2–1.0)
pH, UA: 5 (ref 5.0–7.5)

## 2015-06-13 LAB — MICROSCOPIC EXAMINATION: RBC, UA: NONE SEEN /hpf (ref 0–?)

## 2015-06-13 LAB — BLADDER SCAN AMB NON-IMAGING

## 2015-06-13 MED ORDER — TAMSULOSIN HCL 0.4 MG PO CAPS: ORAL_CAPSULE | ORAL | Status: DC

## 2015-06-13 MED ORDER — FINASTERIDE 5 MG PO TABS: 5.0000 mg | ORAL_TABLET | Freq: Every day | ORAL | Status: DC

## 2015-06-13 NOTE — Progress Notes (Signed)
06/13/2015 10:35 AM   [name] [name] 12-03-1955 [id number]  Referring provider: No referring provider defined for this encounter.  Chief Complaint  Patient presents with  . weak stream    71month    HPI: The patient is a 59 year old gentleman with long-standing diabetes. A recent serum creatinine was 1.5 The patient was evaluated by urology in [place] and recently by Dr. Louis Meckel He was straining to void with a feeling of incomplete bladder emptying on Flomax. He was not sure if it was helping was last assessed in May 2016 he doubled his Flomax and Avodart finasteride and is here for follow-up     PMH: Past Medical History  Diagnosis Date  . Peripheral vascular disease ([place])   . Diabetes mellitus   . Mild aortic stenosis   . Tobacco use disorder     recently quit  . Depression   . Psoriasis   . Neuropathy ([place])   . Obesity   . Post splenectomy syndrome   . SOB (shortness of breath)   . Carotid artery occlusion   . Coronary artery disease   . Hyperlipidemia   . Hypertension   . Bell's palsy 2007  . Sleep apnea   . Angina at rest [facility])     chronic  . Cataract     Dr. Dawna Part  . Anxiety   . Arthritis   . DVT (deep venous thrombosis) (H[facility])     in leg  . Stroke ([place])   . Allergy   . Heart murmur   . Tubular adenoma 08/2013    Dr. Hilarie Fredrickson  . Adenomatous colon polyp   . Gastropathy   . Duodenitis   . Helicobacter pylori gastritis   . Diverticulosis   . Weak urinary stream     Surgical History: Past Surgical History  Procedure Laterality Date  . Splenectomy    . Heart stents  Jan 2011    leg stents 06/2009 and 03/2010  . Coronary artery bypass graft  09/06/2010    At [facility]: LIMA to LAD, left radial to RCA, sequential SVG to OM3 and 4  . Carotid endarterectomy  08/2010    left/ Dr. Kellie Simmering  . Pta of illiac and sfa  multiple    Dr. Ronalee Belts, s/p revision 11/2012  . Colonoscopy    . Cardiac catheterization  08/2010    LAD: 80% ISR, RCA: 80% ostial, OM  80-90%  . Coronary angioplasty      LAD: before CABG    Home Medications:    Medication List       This list is accurate as of: 06/13/15 10:35 AM.  Always use your most recent med list.               albuterol 108 (90 BASE) MCG/ACT inhaler  Commonly known as:  PROVENTIL HFA;VENTOLIN HFA  Inhale 2 puffs into the lungs every 4 (four) hours as needed for wheezing or shortness of breath.     aspirin 81 MG tablet  Take 81 mg by mouth daily.     BAYER MICROLET LANCETS lancets  use as directed three times a day Dx: E11.9     busPIRone 10 MG tablet  Commonly known as:  BUSPAR  Take 2 tablets (20 mg total) by mouth 2 (two) times daily.     cholecalciferol 1000 UNITS tablet  Commonly known as:  VITAMIN D  Take 1,000 Units by mouth daily.     Cinnamon 500 MG capsule  Take 1,000 mg by  mouth daily.     clopidogrel 75 MG tablet  Commonly known as:  PLAVIX  take 1 tablet by mouth once daily     finasteride 5 MG tablet  Commonly known as:  PROSCAR     FISH OIL PO  Take by mouth.     GLUCOSAMINE CHONDR 500 COMPLEX Caps  Take 1,000 each by mouth daily.     glucose blood test strip  Commonly known as:  BAYER CONTOUR TEST  Use to test blood sugar three times daily as instructed.  Dx code: E11.9     Insulin Pen Needle 32G X 6 MM Misc  Commonly known as:  NOVOFINE  Use three times daily     insulin regular 100 units/mL injection  Commonly known as:  NOVOLIN R,HUMULIN R  Inject 110 units daily as avised     insulin regular 100 units/mL injection  Commonly known as:  NOVOLIN R  Inject into the skin 40 units before breakfast, 25 units before lunch and 40 units before dinner.     ketoconazole 2 % cream  Commonly known as:  NIZORAL  apply to affected area at bedtime     Lancet Device Misc  1 application by Does not apply route as needed.     losartan 25 MG tablet  Commonly known as:  COZAAR  take 1 tablet by mouth once daily     magnesium gluconate 500 MG tablet    Commonly known as:  MAGONATE  Take 1,000 mg by mouth daily.     metoprolol succinate 50 MG 24 hr tablet  Commonly known as:  TOPROL-XL  take 1 tablet by mouth twice a day     multivitamin capsule  Take 1 capsule by mouth daily.     naloxegol oxalate 25 MG Tabs tablet  Commonly known as:  MOVANTIK  Take 1 tablet (25 mg total) by mouth daily. 1 hour before a meal     nitazoxanide 500 MG tablet  Commonly known as:  ALINIA  Take 1 tablet (500 mg total) by mouth 2 (two) times daily with a meal.     nitroGLYCERIN 0.4 MG SL tablet  Commonly known as:  NITROSTAT  Place 1 tablet (0.4 mg total) under the tongue every 5 (five) minutes as needed.     omeprazole 20 MG capsule  Commonly known as:  PRILOSEC  Take 1 capsule (20 mg total) by mouth 2 (two) times daily.     polyethylene glycol packet  Commonly known as:  MIRALAX  Take 17 g by mouth daily.     RELION INSULIN SYRINGE 1ML/31G 31G X 5/16" 1 ML Misc  Generic drug:  Insulin Syringe-Needle U-100  USE THREE TIMES A DAY.     sertraline 100 MG tablet  Commonly known as:  ZOLOFT  Take 1.5 tablets (150 mg total) by mouth daily.     simvastatin 20 MG tablet  Commonly known as:  ZOCOR     tamsulosin 0.4 MG Caps capsule  Commonly known as:  FLOMAX  take 2 capsule by mouth daily     triamcinolone ointment 0.1 %  Commonly known as:  KENALOG  apply to affected area twice a day AVOID FACE, GROIN AND UNDERARMS        Allergies:  Allergies  Allergen Reactions  . Glipizide Other (See Comments)    ANTIDIABETICS. Burning  . Metrizamide     Got very hot and red  . Penicillins Hives and Swelling  . Contrast Media [Iodinated  Diagnostic Agents] Rash    Got very hot and red    Family History: Family History  Problem Relation Age of Onset  . Lung cancer Father 31  . Breast cancer Sister 68  . Diabetes Paternal Grandfather   . Colon polyps Paternal Grandfather 22  . Dementia Mother   . Alzheimer's disease Mother     Social  History:  reports that he has been smoking Cigarettes.  He has a 20 pack-year smoking history. He has never used smokeless tobacco. He reports that he does not drink alcohol or use illicit drugs.  ROS: UROLOGY Frequent Urination?: No Hard to postpone urination?: No Burning/pain with urination?: No Get up at night to urinate?: No Leakage of urine?: No Urine stream starts and stops?: No Trouble starting stream?: No Do you have to strain to urinate?: No Blood in urine?: No Urinary tract infection?: No Sexually transmitted disease?: No Injury to kidneys or bladder?: No Painful intercourse?: No Weak stream?: No Erection problems?: No Penile pain?: No  Gastrointestinal Nausea?: No Vomiting?: No Indigestion/heartburn?: No Diarrhea?: No Constipation?: No  Constitutional Fever: No Night sweats?: No Weight loss?: Yes Fatigue?: No  Skin Skin rash/lesions?: No Itching?: Yes  Eyes Blurred vision?: No Double vision?: No  Ears/Nose/Throat Sore throat?: No Sinus problems?: Yes  Hematologic/Lymphatic Swollen glands?: No Easy bruising?: No  Cardiovascular Leg swelling?: No Chest pain?: No  Respiratory Cough?: No Shortness of breath?: No  Endocrine Excessive thirst?: No  Musculoskeletal Back pain?: No Joint pain?: No  Neurological Headaches?: No Dizziness?: No  Psychologic Depression?: Yes Anxiety?: Yes  Physical Exam: BP 130/70 mmHg  Pulse 81  Ht 5\' 6"  (1.676 m)  Wt 266 lb 4.8 oz (120.793 kg)  BMI 43.00 kg/m2    Laboratory Data: Lab Results  Component Value Date   WBC 9.6 12/21/2014   HGB 12.8* 12/21/2014   HCT 39.3 12/21/2014   MCV 87.9 12/21/2014   PLT 236 12/21/2014    Lab Results  Component Value Date   CREATININE 1.50 03/14/2015    Lab Results  Component Value Date   PSA 0.18 11/23/2013   PSA 0.19 11/04/2012    No results found for: TESTOSTERONE  Lab Results  Component Value Date   HGBA1C 6.4 03/14/2015    Urinalysis      Component Value Date/Time   COLORURINE YELLOW 12/21/2014 1336   APPEARANCEUR CLEAR 12/21/2014 1336   LABSPEC 1.015 12/21/2014 1336   PHURINE 5.0 12/21/2014 1336   GLUCOSEU NEGATIVE 12/21/2014 1336   HGBUR NEGATIVE 12/21/2014 1336   [place] 12/21/2014 1336   BILIRUBINUR neg 06/01/2014 [place] 12/21/2014 1336   PROTEINUR 30* 12/21/2014 1336   PROTEINUR trace 06/01/2014 1118   UROBILINOGEN 0.2 12/21/2014 1336   UROBILINOGEN 0.2 06/01/2014 1118   NITRITE NEGATIVE 12/21/2014 1336   NITRITE neg 06/01/2014 1118   LEUKOCYTESUR NEGATIVE 12/21/2014 1336    Pertinent Imaging: Post void residual was 0 mL  Assessment & Plan:  [name] said his flow is much better. His frequency is mild and stable. Both prescriptions renewed. See annually.  1. Weak urinary stream 2. Urinary frequency - Urinalysis, Complete - BLADDER SCAN AMB NON-IMAGING    Reece Packer, MD  [facility] [address] [place], [place] [address] [phone]

## 2015-06-14 ENCOUNTER — Encounter: Payer: Self-pay | Admitting: Internal Medicine

## 2015-06-14 ENCOUNTER — Ambulatory Visit (INDEPENDENT_AMBULATORY_CARE_PROVIDER_SITE_OTHER): Payer: Medicare Other | Admitting: Internal Medicine

## 2015-06-14 VITALS — BP 133/79 | HR 93 | Temp 98.1°F | Ht 68.0 in | Wt 267.5 lb

## 2015-06-14 DIAGNOSIS — I2510 Atherosclerotic heart disease of native coronary artery without angina pectoris: Secondary | ICD-10-CM | POA: Diagnosis not present

## 2015-06-14 DIAGNOSIS — E1159 Type 2 diabetes mellitus with other circulatory complications: Secondary | ICD-10-CM

## 2015-06-14 DIAGNOSIS — M545 Low back pain: Secondary | ICD-10-CM | POA: Diagnosis not present

## 2015-06-14 DIAGNOSIS — I10 Essential (primary) hypertension: Secondary | ICD-10-CM | POA: Diagnosis not present

## 2015-06-14 DIAGNOSIS — G4701 Insomnia due to medical condition: Secondary | ICD-10-CM | POA: Diagnosis not present

## 2015-06-14 DIAGNOSIS — G4733 Obstructive sleep apnea (adult) (pediatric): Secondary | ICD-10-CM | POA: Diagnosis not present

## 2015-06-14 DIAGNOSIS — Z72821 Inadequate sleep hygiene: Secondary | ICD-10-CM | POA: Diagnosis not present

## 2015-06-14 LAB — COMPREHENSIVE METABOLIC PANEL
ALT: 18 U/L (ref 0–53)
AST: 16 U/L (ref 0–37)
Albumin: 3.7 g/dL (ref 3.5–5.2)
Alkaline Phosphatase: 131 U/L — ABNORMAL HIGH (ref 39–117)
BUN: 25 mg/dL — ABNORMAL HIGH (ref 6–23)
CO2: 24 mEq/L (ref 19–32)
Calcium: 9.1 mg/dL (ref 8.4–10.5)
Chloride: 105 mEq/L (ref 96–112)
Creatinine, Ser: 1.35 mg/dL (ref 0.40–1.50)
GFR: 56.97 mL/min — ABNORMAL LOW (ref >60.00)
Glucose, Bld: 105 mg/dL — ABNORMAL HIGH (ref 70–99)
Potassium: 4.3 mEq/L (ref 3.5–5.1)
Sodium: 138 mEq/L (ref 135–145)
Total Bilirubin: 0.5 mg/dL (ref 0.2–1.2)
Total Protein: 8 g/dL (ref 6.0–8.3)

## 2015-06-14 LAB — LIPID PANEL
Cholesterol: 108 mg/dL (ref 0–200)
HDL: 27.6 mg/dL — ABNORMAL LOW (ref >39.00)
LDL Cholesterol: 62 mg/dL (ref 0–99)
NonHDL: 80.57
Total CHOL/HDL Ratio: 4
Triglycerides: 93 mg/dL (ref 0.0–149.0)
VLDL: 18.6 mg/dL (ref 0.0–40.0)

## 2015-06-14 LAB — MICROALBUMIN / CREATININE URINE RATIO
Creatinine,U: 105.7 mg/dL
Microalb Creat Ratio: 36 mg/g — ABNORMAL HIGH (ref 0.0–30.0)
Microalb, Ur: 38 mg/dL — ABNORMAL HIGH (ref 0.0–1.9)

## 2015-06-14 LAB — HEMOGLOBIN A1C: Hgb A1c MFr Bld: 6.6 % — ABNORMAL HIGH (ref 4.6–6.5)

## 2015-06-14 MED ORDER — OXYCODONE-ACETAMINOPHEN 5-325 MG PO TABS: 1.0000 | ORAL_TABLET | Freq: Three times a day (TID) | ORAL | Status: DC | PRN

## 2015-06-14 NOTE — Assessment & Plan Note (Signed)
Wt Readings from Last 3 Encounters:  06/14/15 267 lb 8 oz (121.337 kg)  06/13/15 266 lb 4.8 oz (120.793 kg)  04/29/15 268 lb 9.6 oz (121.836 kg)   Encouraged healthy diet and exercise.

## 2015-06-14 NOTE — Assessment & Plan Note (Signed)
BG well controlled by report. Will check A1c with labs. Continue follow up with Dr. Cruzita Lederer.

## 2015-06-14 NOTE — Progress Notes (Signed)
Subjective:    Patient ID: [name] [name], male    DOB: May 06, 1956, 59 y.o.   MRN: [id number]  HPI  59YO male presents for follow up.  DM - BG well controlled. Did not bring record. Reports compliance with medication. BG were more elevated when recently ill. None over 200.  HTN - Compliant with BP meds. No recent chest pain.  Plans to follow up with cardiology later this year with ultrasound of carotids.  Wt Readings from Last 3 Encounters:  06/14/15 267 lb 8 oz (121.337 kg)  06/13/15 266 lb 4.8 oz (120.793 kg)  04/29/15 268 lb 9.6 oz (121.836 kg)   BP Readings from Last 3 Encounters:  06/14/15 133/79  06/13/15 130/70  04/29/15 124/70    Past Medical History  Diagnosis Date  . Peripheral vascular disease ([place])   . Diabetes mellitus   . Mild aortic stenosis   . Tobacco use disorder     recently quit  . Depression   . Psoriasis   . Neuropathy ([place])   . Obesity   . Post splenectomy syndrome   . SOB (shortness of breath)   . Carotid artery occlusion   . Coronary artery disease   . Hyperlipidemia   . Hypertension   . Bell's palsy 2007  . Sleep apnea   . Angina at rest [facility])     chronic  . Cataract     Dr. Dawna Part  . Anxiety   . Arthritis   . DVT (deep venous thrombosis) ([facility])     in leg  . Stroke ([place])   . Allergy   . Heart murmur   . Tubular adenoma 08/2013    Dr. Hilarie Fredrickson  . Adenomatous colon polyp   . Gastropathy   . Duodenitis   . Helicobacter pylori gastritis   . Diverticulosis   . Weak urinary stream    Family History  Problem Relation Age of Onset  . Lung cancer Father 47  . Breast cancer Sister 73  . Diabetes Paternal Grandfather   . Colon polyps Paternal Grandfather 36  . Dementia Mother   . Alzheimer's disease Mother    Past Surgical History  Procedure Laterality Date  . Splenectomy    . Heart stents  Jan 2011    leg stents 06/2009 and 03/2010  . Coronary artery bypass graft  09/06/2010    At [facility]: LIMA to LAD, left radial to RCA,  sequential SVG to OM3 and 4  . Carotid endarterectomy  08/2010    left/ Dr. Kellie Simmering  . Pta of illiac and sfa  multiple    Dr. Ronalee Belts, s/p revision 11/2012  . Colonoscopy    . Cardiac catheterization  08/2010    LAD: 80% ISR, RCA: 80% ostial, OM 80-90%  . Coronary angioplasty      LAD: before CABG   Social History   Social History  . Marital Status: Married    Spouse Name: N/A  . Number of Children: 0  . Years of Education: N/A   Occupational History  .     Social History Main Topics  . Smoking status: Current Some Day Smoker -- 0.50 packs/day for 40 years    Types: Cigarettes  . Smokeless tobacco: Never Used     Comment: smokes 2-3 cigarettes every 3 weeks  . Alcohol Use: No  . Drug Use: No  . Sexual Activity: No   Other Topics Concern  . None   Social History Narrative    Review of  Systems  Constitutional: Negative for fever, chills, activity change, appetite change, fatigue and unexpected weight change.  Eyes: Negative for visual disturbance.  Respiratory: Negative for cough and shortness of breath.   Cardiovascular: Negative for chest pain, palpitations and leg swelling.  Gastrointestinal: Negative for abdominal pain, diarrhea, constipation and abdominal distention.  Genitourinary: Negative for dysuria, urgency and difficulty urinating.  Musculoskeletal: Negative for arthralgias and gait problem.  Skin: Negative for color change and rash.  Hematological: Negative for adenopathy.  Psychiatric/Behavioral: Negative for sleep disturbance and dysphoric mood. The patient is not nervous/anxious.        Objective:    BP 133/79 mmHg  Pulse 93  Temp(Src) 98.1 F (36.7 C) (Oral)  Ht 5\' 8"  (1.727 m)  Wt 267 lb 8 oz (121.337 kg)  BMI 40.68 kg/m2  SpO2 97% Physical Exam  Constitutional: He is oriented to person, place, and time. He appears well-developed and well-nourished. No distress.  HENT:  Head: Normocephalic and atraumatic.  Right Ear: External ear normal.    Left Ear: External ear normal.  Nose: Nose normal.  Mouth/Throat: Oropharynx is clear and moist. No oropharyngeal exudate.  Eyes: Conjunctivae and EOM are normal. Pupils are equal, round, and reactive to light. Right eye exhibits no discharge. Left eye exhibits no discharge. No scleral icterus.  Neck: Normal range of motion. Neck supple. No tracheal deviation present. No thyromegaly present.  Cardiovascular: Normal rate and regular rhythm.  Exam reveals no gallop and no friction rub.   Murmur heard. Pulmonary/Chest: Effort normal and breath sounds normal. No accessory muscle usage. No tachypnea. No respiratory distress. He has no decreased breath sounds. He has no wheezes. He has no rhonchi. He has no rales. He exhibits no tenderness.  Musculoskeletal: Normal range of motion. He exhibits no edema.  Lymphadenopathy:    He has no cervical adenopathy.  Neurological: He is alert and oriented to person, place, and time. No cranial nerve deficit. Coordination normal.  Skin: Skin is warm and dry. No rash noted. He is not diaphoretic. No erythema. No pallor.  Psychiatric: He has a normal mood and affect. His behavior is normal. Judgment and thought content normal.          Assessment & Plan:   Problem List Items Addressed This Visit      Unprioritized   Hypertension    BP Readings from Last 3 Encounters:  06/14/15 133/79  06/13/15 130/70  04/29/15 124/70   BP well controlled. Renal function with labs. Continue current medications.      Lumbar back pain    Chronic low back pain from OA. Unable to take NSAIDS. Continue prn Oxycodone for severe pain only.      Relevant Medications   oxyCODONE-acetaminophen (ROXICET) 5-325 MG tablet   Severe obesity (BMI >= 40) (HCC)    Wt Readings from Last 3 Encounters:  06/14/15 267 lb 8 oz (121.337 kg)  06/13/15 266 lb 4.8 oz (120.793 kg)  04/29/15 268 lb 9.6 oz (121.836 kg)   Encouraged healthy diet and exercise.      Type 2 diabetes  mellitus with vascular disease ([place]) - Primary    BG well controlled by report. Will check A1c with labs. Continue follow up with Dr. Cruzita Lederer.      Relevant Orders   Comprehensive metabolic panel   Hemoglobin A1c   Lipid panel   Microalbumin / creatinine urine ratio       Return in about 3 months (around 09/14/2015) for Recheck of Diabetes.

## 2015-06-14 NOTE — Assessment & Plan Note (Signed)
Chronic low back pain from OA. Unable to take NSAIDS. Continue prn Oxycodone for severe pain only.

## 2015-06-14 NOTE — Patient Instructions (Addendum)
Labs today.  Set goal of walking 31min daily.  Follow up in 3 months.

## 2015-06-14 NOTE — Assessment & Plan Note (Signed)
BP Readings from Last 3 Encounters:  06/14/15 133/79  06/13/15 130/70  04/29/15 124/70   BP well controlled. Renal function with labs. Continue current medications.

## 2015-06-14 NOTE — Progress Notes (Signed)
Pre visit review using our clinic review tool, if applicable. No additional management support is needed unless otherwise documented below in the visit note. 

## 2015-06-22 ENCOUNTER — Ambulatory Visit: Payer: Medicare Other | Admitting: Psychology

## 2015-06-24 ENCOUNTER — Ambulatory Visit (INDEPENDENT_AMBULATORY_CARE_PROVIDER_SITE_OTHER): Payer: Medicare Other | Admitting: Psychiatry

## 2015-06-24 ENCOUNTER — Encounter: Payer: Self-pay | Admitting: Psychiatry

## 2015-06-24 VITALS — BP 124/68 | HR 90 | Temp 97.0°F | Ht 68.0 in | Wt 266.4 lb

## 2015-06-24 DIAGNOSIS — F411 Generalized anxiety disorder: Secondary | ICD-10-CM | POA: Diagnosis not present

## 2015-06-24 DIAGNOSIS — F419 Anxiety disorder, unspecified: Secondary | ICD-10-CM | POA: Diagnosis not present

## 2015-06-24 DIAGNOSIS — I2510 Atherosclerotic heart disease of native coronary artery without angina pectoris: Secondary | ICD-10-CM

## 2015-06-24 MED ORDER — BUPROPION HCL ER (XL) 150 MG PO TB24: 150.0000 mg | ORAL_TABLET | Freq: Every day | ORAL | Status: DC

## 2015-06-24 NOTE — Progress Notes (Signed)
[place] MD/PA/NP OP Progress Note  06/24/2015 12:21 PM [name] [name]  MRN:  [id number]  Subjective:  Patient returns for follow-up was generalized anxiety disorder and depression. He indicated things are going well his mood is been stable. At the last visit he discussed the stressor of living with his son, daughter-in-law and his grandson. He indicates they're still there and is not been any disputes since the one that occurred prior to his last visit  He states the only question he has right now is perhaps returning to his Wellbutrin. He states his smoking has increased and he was aware that Wellbutrin can help with smoking.  Chief Complaint: wellbutrin Chief Complaint    Follow-up; Medication Refill     Visit Diagnosis:     ICD-9-CM ICD-10-CM   1. Anxiety 300.00 F41.9   2. GAD (generalized anxiety disorder) 300.02 F41.1     Past Medical History:  Past Medical History  Diagnosis Date  . Peripheral vascular disease ([place])   . Diabetes mellitus   . Mild aortic stenosis   . Tobacco use disorder     recently quit  . Depression   . Psoriasis   . Neuropathy ([place])   . Obesity   . Post splenectomy syndrome   . SOB (shortness of breath)   . Carotid artery occlusion   . Coronary artery disease   . Hyperlipidemia   . Hypertension   . Bell's palsy 2007  . Sleep apnea   . Angina at rest [facility])     chronic  . Cataract     Dr. Dawna Part  . Anxiety   . Arthritis   . DVT (deep venous thrombosis) ([facility])     in leg  . Stroke ([place])   . Allergy   . Heart murmur   . Tubular adenoma 08/2013    Dr. Hilarie Fredrickson  . Adenomatous colon polyp   . Gastropathy   . Duodenitis   . Helicobacter pylori gastritis   . Diverticulosis   . Weak urinary stream     Past Surgical History  Procedure Laterality Date  . Splenectomy    . Heart stents  Jan 2011    leg stents 06/2009 and 03/2010  . Coronary artery bypass graft  09/06/2010    At [facility]: LIMA to LAD, left radial to RCA, sequential SVG to OM3 and 4  .  Carotid endarterectomy  08/2010    left/ Dr. Kellie Simmering  . Pta of illiac and sfa  multiple    Dr. Ronalee Belts, s/p revision 11/2012  . Colonoscopy    . Cardiac catheterization  08/2010    LAD: 80% ISR, RCA: 80% ostial, OM 80-90%  . Coronary angioplasty      LAD: before CABG   Family History:  Family History  Problem Relation Age of Onset  . Lung cancer Father 33  . Breast cancer Sister 56  . Diabetes Paternal Grandfather   . Colon polyps Paternal Grandfather 56  . Dementia Mother   . Alzheimer's disease Mother    Social History:  Social History   Social History  . Marital Status: Married    Spouse Name: N/A  . Number of Children: 0  . Years of Education: N/A   Occupational History  .     Social History Main Topics  . Smoking status: Current Some Day Smoker -- 0.50 packs/day for 40 years    Types: Cigarettes  . Smokeless tobacco: Never Used     Comment: smokes 2-3 cigarettes every 3 weeks  .  Alcohol Use: No  . Drug Use: No  . Sexual Activity: No   Other Topics Concern  . None   Social History Narrative   Additional History:   Assessment:   Musculoskeletal: Strength & Muscle Tone: within normal limits Gait & Station: unsteady walks with cane Patient leans: N/A  Psychiatric Specialty Exam: Anxiety Patient reports no insomnia, nervous/anxious behavior or suicidal ideas.    Depression        Associated symptoms include does not have insomnia and no suicidal ideas.  Past medical history includes anxiety.     Review of Systems  Neurological: Positive for tingling (he does report he has neuropathy and has had this for a long time).  Psychiatric/Behavioral: Negative.  Negative for depression, suicidal ideas, hallucinations, memory loss and substance abuse. The patient is not nervous/anxious and does not have insomnia.   All other systems reviewed and are negative.   Blood pressure 124/68, pulse 90, temperature 97 F (36.1 C), temperature source Tympanic, height 5'  8" (1.727 m), weight 266 lb 6.4 oz (120.838 kg), SpO2 98 %.Body mass index is 40.52 kg/(m^2).  General Appearance: Well Groomed  Eye Contact:  Good  Speech:  Normal Rate  Volume:  Normal  Mood:  Good  Affect:  Congruent  Thought Process:  Circumstantial  Orientation:  Full (Time, Place, and Person)  Thought Content:  Negative  Suicidal Thoughts:  No  Homicidal Thoughts:  No  Memory:  Immediate;   Good Recent;   Good Remote;   Good  Judgement:  Good  Insight:  Good  Psychomotor Activity:  Negative  Concentration:  Good  Recall:  Good  Fund of Knowledge: Good  Language: Good  Akathisia:  Negative  Handed:  Right  AIMS (if indicated):  N/A  Assets:  Communication Skills Desire for Improvement Social Support  ADL's:  Intact  Cognition: WNL  Sleep:  Good    Is the patient at risk to self?  No. Has the patient been a risk to self in the past 6 months?  No. Has the patient been a risk to self within the distant past?  No. Is the patient a risk to others?  No. Has the patient been a risk to others in the past 6 months?  No. Has the patient been a risk to others within the distant past?  No.  Current Medications: Current Outpatient Prescriptions  Medication Sig Dispense Refill  . aspirin 81 MG tablet Take 81 mg by mouth daily.    [name] BAYER MICROLET LANCETS lancets use as directed three times a day Dx: E11.9 100 each 12  . cholecalciferol (VITAMIN D) 1000 UNITS tablet Take 1,000 Units by mouth daily.      . Cinnamon 500 MG capsule Take 1,000 mg by mouth daily.    . clopidogrel (PLAVIX) 75 MG tablet take 1 tablet by mouth once daily 90 tablet 3  . finasteride (PROSCAR) 5 MG tablet Take 1 tablet (5 mg total) by mouth daily. 30 tablet 11  . Glucosamine-Chondroit-Vit C-Mn (GLUCOSAMINE CHONDR 500 COMPLEX) CAPS Take 1,000 each by mouth daily.    [name] glucose blood (BAYER CONTOUR TEST) test strip Use to test blood sugar three times daily as instructed.  Dx code: E11.9 100 each 5  .  Insulin Pen Needle (NOVOFINE) 32G X 6 MM MISC Use three times daily 100 each 5  . insulin regular (NOVOLIN R,HUMULIN R) 100 units/mL injection Inject 110 units daily as avised (Patient taking differently: Inject 25-45 Units into  the skin 3 (three) times daily before meals. 40 units in the morning, 25 units at lunch, 45 units at dinner) 40 mL 11  . ketoconazole (NIZORAL) 2 % cream apply to affected area at bedtime  0  . Lancet Device MISC 1 application by Does not apply route as needed. 100 each 11  . losartan (COZAAR) 25 MG tablet take 1 tablet by mouth once daily 90 tablet 3  . magnesium gluconate (MAGONATE) 500 MG tablet Take 1,000 mg by mouth daily.    . metoprolol succinate (TOPROL-XL) 50 MG 24 hr tablet take 1 tablet by mouth twice a day 60 tablet 5  . Multiple Vitamin (MULTIVITAMIN) capsule Take 1 capsule by mouth daily.      . naloxegol oxalate (MOVANTIK) 25 MG TABS tablet Take 1 tablet (25 mg total) by mouth daily. 1 hour before a meal 30 tablet 2  . nitazoxanide (ALINIA) 500 MG tablet Take 1 tablet (500 mg total) by mouth 2 (two) times daily with a meal. 20 tablet 0  . nitroGLYCERIN (NITROSTAT) 0.4 MG SL tablet Place 1 tablet (0.4 mg total) under the tongue every 5 (five) minutes as needed. 25 tablet 1  . Omega-3 Fatty Acids (FISH OIL PO) Take by mouth.    [name] omeprazole (PRILOSEC) 20 MG capsule Take 1 capsule (20 mg total) by mouth 2 (two) times daily. 20 capsule 0  . oxyCODONE-acetaminophen (ROXICET) 5-325 MG tablet Take 1 tablet by mouth every 8 (eight) hours as needed for severe pain. 60 tablet 0  . polyethylene glycol (MIRALAX) packet Take 17 g by mouth daily. 14 each 3  . RELION INSULIN SYRINGE 1ML/31G 31G X 5/16" 1 ML MISC USE THREE TIMES A DAY. 290 each 1  . sertraline (ZOLOFT) 100 MG tablet Take 1.5 tablets (150 mg total) by mouth daily. 45 tablet 3  . simvastatin (ZOCOR) 20 MG tablet   0  . tamsulosin (FLOMAX) 0.4 MG CAPS capsule take 2 capsule by mouth daily 60 capsule 11  .  triamcinolone ointment (KENALOG) 0.1 % apply to affected area twice a day AVOID FACE, GROIN AND UNDERARMS  0  . buPROPion (WELLBUTRIN XL) 150 MG 24 hr tablet Take 1 tablet (150 mg total) by mouth daily. 30 tablet 1  . busPIRone (BUSPAR) 10 MG tablet Take 2 tablets (20 mg total) by mouth 2 (two) times daily. 120 tablet 3   No current facility-administered medications for this visit.    Medical Decision Making:  Established Problem, Stable/Improving (1) and Review of New Medication or Change in Dosage (2)  Treatment Plan Summary:Medication management and Plan   Depressive disorder, moderate, recurrent-stable We will continue the sertraline at 150 mg daily. We're going to restart him back on his Wellbutrin given that he would like to address his smoking. Risk and benefits have been discussed and patient will restart Wellbutrin XL 150 mg in the morning.  Generalized anxiety disorder-stable We'll continue his BuSpar to 20 mg twice daily.    Patient will follow up in 1 month. He's been encouraged call any questions or concerns prior to his next appointment.    Faith Rogue 06/24/2015, 12:21 PM

## 2015-06-27 ENCOUNTER — Telehealth: Payer: Self-pay | Admitting: Internal Medicine

## 2015-06-27 ENCOUNTER — Encounter: Payer: Self-pay | Admitting: Internal Medicine

## 2015-06-27 NOTE — Telephone Encounter (Signed)
Returned pt's call and lvm advising him to return call to let us know what times of day he is having low b/s.

## 2015-06-27 NOTE — Telephone Encounter (Signed)
[name] [name], I will send him a msg in [place], but please check with him in am if he got it.  Please decrease the insulin doses as follows:  Insulin Before breakfast Before lunch Before dinner  Regular (short acting) 40>> 30 25>> 20 40>> 30  NPH (long acting) 40  30

## 2015-06-27 NOTE — Telephone Encounter (Signed)
Patient stated that he has been having some low b/s with the Novolin R and N please advise on how it can be adjusted

## 2015-06-27 NOTE — Telephone Encounter (Signed)
Pt returned call. He stated his blood sugar was 114 this AM, then dropped to 49 2 hrs after lunch. Sunday, 110 in the AM, 141 at lunch and 73 after dinner. Around the same readings for Saturday. Please advise.

## 2015-06-28 DIAGNOSIS — B354 Tinea corporis: Secondary | ICD-10-CM | POA: Diagnosis not present

## 2015-06-28 DIAGNOSIS — L400 Psoriasis vulgaris: Secondary | ICD-10-CM | POA: Diagnosis not present

## 2015-06-28 DIAGNOSIS — B356 Tinea cruris: Secondary | ICD-10-CM | POA: Diagnosis not present

## 2015-06-28 NOTE — Telephone Encounter (Signed)
Called and spoke with pt's wife. She said he saw the message on MyChart and will do as she advised. Pt will let us know if he continues to have lows. Be advised.

## 2015-06-29 ENCOUNTER — Ambulatory Visit (INDEPENDENT_AMBULATORY_CARE_PROVIDER_SITE_OTHER): Payer: Medicare Other | Admitting: Psychology

## 2015-06-29 DIAGNOSIS — F332 Major depressive disorder, recurrent severe without psychotic features: Secondary | ICD-10-CM

## 2015-06-29 DIAGNOSIS — F449 Dissociative and conversion disorder, unspecified: Secondary | ICD-10-CM

## 2015-07-01 ENCOUNTER — Telehealth: Payer: Self-pay | Admitting: Internal Medicine

## 2015-07-01 NOTE — Telephone Encounter (Signed)
BS readings: 12/6 AM 112; lunch 138; no PM 12/7 AM 122; lunch 126; PM 101 12/8 AM 78; lunch 117; PM 101; when in bed dropped to 32 12/9 AM 87

## 2015-07-01 NOTE — Telephone Encounter (Signed)
Called and spoke with pt and his wife, advised them per Dr Arman Filter message below. Pt voiced understanding and will call on Monday with sugar readings.

## 2015-07-01 NOTE — Telephone Encounter (Signed)
Please decrease the N and R insulin doses as follows:    Insulin                Before breakfast Before lunch Before dinner  Regular (short acting)     30                            20                  30  >> 20 NPH (long acting)      40                                    30>> 20   Please call us back with sugars in 3 days.

## 2015-07-01 NOTE — Telephone Encounter (Signed)
Pt called to advise of b/s based on medication changes. Please read and advise.

## 2015-07-04 ENCOUNTER — Telehealth: Payer: Self-pay | Admitting: Internal Medicine

## 2015-07-04 NOTE — Telephone Encounter (Signed)
CBGs looking better >> please let us know again at the end of the week.

## 2015-07-04 NOTE — Telephone Encounter (Signed)
Called pt and advised him per Dr Arman Filter message. Pt voiced understanding.

## 2015-07-04 NOTE — Telephone Encounter (Signed)
Please read message below and advise per changes made last week.

## 2015-07-04 NOTE — Telephone Encounter (Signed)
BS: 12/09 AM 87, 1130 236; 530 232 12/10 AM 86; 216, 140 12/11 82, 90, 83  12/12 AM 102

## 2015-07-06 ENCOUNTER — Ambulatory Visit: Payer: Medicare Other | Admitting: Psychology

## 2015-07-11 NOTE — Telephone Encounter (Signed)
Blood sugar readings: 12/12 AM 81; lunch 187; after dinner 132 12/13 AM 106; lunch 140; after dinner 109 12/14 AM 77; 88; 108 12/15 91; 220; 167 12/16 91; 145; 67 12/17 52; 184; 112 12/18 81; 100; 67

## 2015-07-13 ENCOUNTER — Ambulatory Visit (INDEPENDENT_AMBULATORY_CARE_PROVIDER_SITE_OTHER): Payer: Medicare Other | Admitting: Psychology

## 2015-07-13 DIAGNOSIS — F332 Major depressive disorder, recurrent severe without psychotic features: Secondary | ICD-10-CM | POA: Diagnosis not present

## 2015-07-13 DIAGNOSIS — F449 Dissociative and conversion disorder, unspecified: Secondary | ICD-10-CM | POA: Diagnosis not present

## 2015-07-28 ENCOUNTER — Ambulatory Visit (INDEPENDENT_AMBULATORY_CARE_PROVIDER_SITE_OTHER): Payer: Medicare Other | Admitting: Psychiatry

## 2015-07-28 ENCOUNTER — Encounter: Payer: Self-pay | Admitting: Psychiatry

## 2015-07-28 VITALS — BP 128/78 | HR 73 | Temp 97.9°F | Ht 68.0 in | Wt 260.2 lb

## 2015-07-28 DIAGNOSIS — F419 Anxiety disorder, unspecified: Secondary | ICD-10-CM

## 2015-07-28 MED ORDER — BUSPIRONE HCL 10 MG PO TABS: 20.0000 mg | ORAL_TABLET | Freq: Two times a day (BID) | ORAL | Status: DC

## 2015-07-28 MED ORDER — SERTRALINE HCL 100 MG PO TABS: 150.0000 mg | ORAL_TABLET | Freq: Every day | ORAL | Status: DC

## 2015-07-28 MED ORDER — BUPROPION HCL ER (XL) 150 MG PO TB24: 150.0000 mg | ORAL_TABLET | Freq: Every day | ORAL | Status: DC

## 2015-07-28 NOTE — Progress Notes (Signed)
[facility] MD/PA/NP OP Progress Note  07/28/2015 9:02 AM [name] [name]  MRN:  [id number]  Subjective:  Patient returns for follow-up was generalized anxiety disorder and depression. He states things are going well for him. He states the holidays were rough because everyone in his household was physically sick. He states emotionally he's been doing well. At the last visit we added Wellbutrin per his request and he states it is been helpful for both reducing his smoking as well as his mood. He stated that he had an issue with his diabetes and low blood sugars but that he was able to work it out with his session. He states he's been working with his wife who is older than him to help her lose weight, participate in rehabilitation for orthopedic issues and encouraging her to live a healthy lifestyle.   Chief Complaint: good Chief Complaint    Follow-up; Medication Refill     Visit Diagnosis:     ICD-9-CM ICD-10-CM   1. Anxiety 300.00 F41.9 sertraline (ZOLOFT) 100 MG tablet    Past Medical History:  Past Medical History  Diagnosis Date  . Peripheral vascular disease ([place])   . Diabetes mellitus   . Mild aortic stenosis   . Tobacco use disorder     recently quit  . Depression   . Psoriasis   . Neuropathy ([place])   . Obesity   . Post splenectomy syndrome   . SOB (shortness of breath)   . Carotid artery occlusion   . Coronary artery disease   . Hyperlipidemia   . Hypertension   . Bell's palsy 2007  . Sleep apnea   . Angina at rest [facility])     chronic  . Cataract     Dr. Dawna Part  . Anxiety   . Arthritis   . DVT (deep venous thrombosis) ([facility])     in leg  . Stroke ([place])   . Allergy   . Heart murmur   . Tubular adenoma 08/2013    Dr. Hilarie Fredrickson  . Adenomatous colon polyp   . Gastropathy   . Duodenitis   . Helicobacter pylori gastritis   . Diverticulosis   . Weak urinary stream     Past Surgical History  Procedure Laterality Date  . Splenectomy    . Heart stents  Jan 2011    leg stents  06/2009 and 03/2010  . Coronary artery bypass graft  09/06/2010    At [facility]: LIMA to LAD, left radial to RCA, sequential SVG to OM3 and 4  . Carotid endarterectomy  08/2010    left/ Dr. Kellie Simmering  . Pta of illiac and sfa  multiple    Dr. Ronalee Belts, s/p revision 11/2012  . Colonoscopy    . Cardiac catheterization  08/2010    LAD: 80% ISR, RCA: 80% ostial, OM 80-90%  . Coronary angioplasty      LAD: before CABG   Family History:  Family History  Problem Relation Age of Onset  . Lung cancer Father 26  . Breast cancer Sister 12  . Diabetes Paternal Grandfather   . Colon polyps Paternal Grandfather 58  . Dementia Mother   . Alzheimer's disease Mother    Social History:  Social History   Social History  . Marital Status: Married    Spouse Name: N/A  . Number of Children: 0  . Years of Education: N/A   Occupational History  .     Social History Main Topics  . Smoking status: Current Some Day  Smoker -- 0.50 packs/day for 40 years    Types: Cigarettes  . Smokeless tobacco: Never Used     Comment: smokes 2-3 cigarettes every 3 weeks  . Alcohol Use: No  . Drug Use: No  . Sexual Activity: No   Other Topics Concern  . None   Social History Narrative   Additional History:   Assessment:   Musculoskeletal: Strength & Muscle Tone: within normal limits Gait & Station: unsteady walks with cane Patient leans: N/A  Psychiatric Specialty Exam: Anxiety Patient reports no insomnia, nervous/anxious behavior or suicidal ideas.    Depression        Associated symptoms include does not have insomnia and no suicidal ideas.  Past medical history includes anxiety.     Review of Systems  Psychiatric/Behavioral: Negative.  Negative for depression, suicidal ideas, hallucinations, memory loss and substance abuse. The patient is not nervous/anxious and does not have insomnia.   All other systems reviewed and are negative.   Blood pressure 128/78, pulse 73, temperature 97.9 F (36.6 C),  temperature source Tympanic, height 5\' 8"  (1.727 m), weight 260 lb 3.2 oz (118.026 kg), SpO2 92 %.Body mass index is 39.57 kg/(m^2).  General Appearance: Well Groomed  Eye Contact:  Good  Speech:  Normal Rate  Volume:  Normal  Mood:  Good  Affect:  Congruent  Thought Process:  Circumstantial at baseline   Orientation:  Full (Time, Place, and Person)  Thought Content:  Negative  Suicidal Thoughts:  No  Homicidal Thoughts:  No  Memory:  Immediate;   Good Recent;   Good Remote;   Good  Judgement:  Good  Insight:  Good  Psychomotor Activity:  Negative  Concentration:  Good  Recall:  Good  Fund of Knowledge: Good  Language: Good  Akathisia:  Negative  Handed:  Right  AIMS (if indicated):  N/A  Assets:  Communication Skills Desire for Improvement Social Support  ADL's:  Intact  Cognition: WNL  Sleep:  Good    Is the patient at risk to self?  No. Has the patient been a risk to self in the past 6 months?  No. Has the patient been a risk to self within the distant past?  No. Is the patient a risk to others?  No. Has the patient been a risk to others in the past 6 months?  No. Has the patient been a risk to others within the distant past?  No.  Current Medications: Current Outpatient Prescriptions  Medication Sig Dispense Refill  . albuterol (PROAIR HFA) 108 (90 Base) MCG/ACT inhaler Inhale into the lungs.    [name] aspirin 81 MG tablet Take 81 mg by mouth daily.    [name] BAYER MICROLET LANCETS lancets use as directed three times a day Dx: E11.9 100 each 12  . buPROPion (WELLBUTRIN XL) 150 MG 24 hr tablet Take 1 tablet (150 mg total) by mouth daily. 30 tablet 4  . busPIRone (BUSPAR) 10 MG tablet Take 2 tablets (20 mg total) by mouth 2 (two) times daily. 120 tablet 4  . cholecalciferol (VITAMIN D) 1000 UNITS tablet Take 1,000 Units by mouth daily.      . Cinnamon 500 MG capsule Take 1,000 mg by mouth daily.    . clopidogrel (PLAVIX) 75 MG tablet take 1 tablet by mouth once daily 90  tablet 3  . cyclobenzaprine (FLEXERIL) 10 MG tablet     . finasteride (PROSCAR) 5 MG tablet Take 1 tablet (5 mg total) by mouth daily. 30 tablet  11  . Glucosamine-Chondroit-Vit C-Mn (GLUCOSAMINE CHONDR 500 COMPLEX) CAPS Take 1,000 each by mouth daily.    [name] glucose blood (BAYER CONTOUR TEST) test strip Use to test blood sugar three times daily as instructed.  Dx code: E11.9 100 each 5  . Insulin Pen Needle (NOVOFINE) 32G X 6 MM MISC Use three times daily 100 each 5  . insulin regular (NOVOLIN R,HUMULIN R) 100 units/mL injection Inject 110 units daily as avised (Patient taking differently: Inject 25-45 Units into the skin 3 (three) times daily before meals. 40 units in the morning, 25 units at lunch, 45 units at dinner) 40 mL 11  . ketoconazole (NIZORAL) 2 % cream apply to affected area at bedtime  0  . Lancet Device MISC 1 application by Does not apply route as needed. 100 each 11  . losartan (COZAAR) 25 MG tablet take 1 tablet by mouth once daily 90 tablet 3  . magnesium gluconate (MAGONATE) 500 MG tablet Take 1,000 mg by mouth daily.    . metoprolol succinate (TOPROL-XL) 50 MG 24 hr tablet take 1 tablet by mouth twice a day 60 tablet 5  . Multiple Vitamin (MULTIVITAMIN) capsule Take 1 capsule by mouth daily.      . naloxegol oxalate (MOVANTIK) 25 MG TABS tablet Take 1 tablet (25 mg total) by mouth daily. 1 hour before a meal 30 tablet 2  . nitazoxanide (ALINIA) 500 MG tablet Take 1 tablet (500 mg total) by mouth 2 (two) times daily with a meal. 20 tablet 0  . nitroGLYCERIN (NITROSTAT) 0.4 MG SL tablet Place 1 tablet (0.4 mg total) under the tongue every 5 (five) minutes as needed. 25 tablet 1  . Omega-3 Fatty Acids (FISH OIL PO) Take by mouth.    [name] omeprazole (PRILOSEC) 20 MG capsule Take 1 capsule (20 mg total) by mouth 2 (two) times daily. 20 capsule 0  . oxyCODONE-acetaminophen (ROXICET) 5-325 MG tablet Take 1 tablet by mouth every 8 (eight) hours as needed for severe pain. 60 tablet 0  .  polyethylene glycol (MIRALAX) packet Take 17 g by mouth daily. 14 each 3  . RELION INSULIN SYRINGE 1ML/31G 31G X 5/16" 1 ML MISC USE THREE TIMES A DAY. 290 each 1  . sertraline (ZOLOFT) 100 MG tablet Take 1.5 tablets (150 mg total) by mouth daily. 45 tablet 4  . simvastatin (ZOCOR) 20 MG tablet   0  . tamsulosin (FLOMAX) 0.4 MG CAPS capsule take 2 capsule by mouth daily 60 capsule 11  . triamcinolone ointment (KENALOG) 0.1 % apply to affected area twice a day AVOID FACE, GROIN AND UNDERARMS  0   No current facility-administered medications for this visit.    Medical Decision Making:  Established Problem, Stable/Improving (1) and Review of New Medication or Change in Dosage (2)  Treatment Plan Summary:Medication management and Plan   Depressive disorder, moderate, recurrent-stable We will continue the sertraline at 150 mg daily and  Wellbutrin XL 150 mg in the morning.  Generalized anxiety disorder-stable We'll continue his BuSpar to 20 mg twice daily.    Patient will follow up in 1 month. He is aware that I'll be departing clinic in February and he can continue with another doctor within this clinic. He's been encouraged call any questions or concerns prior to his next appointment.    Faith Rogue 07/28/2015, 9:02 AM

## 2015-07-29 ENCOUNTER — Encounter: Payer: Self-pay | Admitting: Internal Medicine

## 2015-07-29 ENCOUNTER — Other Ambulatory Visit: Payer: Self-pay | Admitting: *Deleted

## 2015-07-29 ENCOUNTER — Ambulatory Visit (INDEPENDENT_AMBULATORY_CARE_PROVIDER_SITE_OTHER): Payer: Medicare Other | Admitting: Internal Medicine

## 2015-07-29 VITALS — BP 122/64 | HR 69 | Temp 97.8°F | Resp 12 | Wt 256.8 lb

## 2015-07-29 DIAGNOSIS — E1159 Type 2 diabetes mellitus with other circulatory complications: Secondary | ICD-10-CM | POA: Diagnosis not present

## 2015-07-29 MED ORDER — INSULIN REGULAR HUMAN 100 UNIT/ML IJ SOLN: 10.0000 [IU] | Freq: Three times a day (TID) | INTRAMUSCULAR | Status: DC

## 2015-07-29 MED ORDER — INSULIN NPH (HUMAN) (ISOPHANE) 100 UNIT/ML ~~LOC~~ SUSP: SUBCUTANEOUS | Status: DC

## 2015-07-29 MED ORDER — "INSULIN SYRINGE-NEEDLE U-100 31G X 5/16"" 1 ML MISC": Status: DC

## 2015-07-29 NOTE — Progress Notes (Signed)
Patient ID: [name] [name], male   DOB: Nov 27, 1955, 60 y.o.   MRN: [id number]  HPI: MESHAL BREUNINGER is a 60 y.o.-year-old male, returning for f/u for DM2, dx 2006, insulin-dependent since 2011, uncontrolled, with complications (CAD - CABG 2012, PVD - s/p Stents, Aortic and carotid stenosis, peripheral neuropathy). Last visit 3 mo ago.  Lost another 10 lbs since last visit!  Last hemoglobin A1c: Lab Results  Component Value Date   HGBA1C 6.6* 06/14/2015   HGBA1C 6.4 03/14/2015   HGBA1C 7.4* 12/10/2014   Pt was on a regimen of: - Lantus 60 units bid >> 60 units in HS >> 80 units in HS - Novolog 14 >> 17 >> 25-25-30 We stopped Victoza at a previous visit. He could not afford his insulins (doughnut hole) - 400$.   He is now on: N and R insulins - decreased 04/2015 b/c low CBGs:  Insulin Before breakfast Before lunch Before dinner  Regular (short acting) 30 Stopped R with lunch since last visit 20  NPH (long acting) 40  20   Pt checks his sugars 3x a day and they are (reviewed log): - am: 86, 133-289 >> 95-180, 190 >> 76, 98-163, 180, 190 >> 132-144 >> 88-166, 196, 309 >> 89-154, 193 >> 69, 82-143 - 2h after b'fast: 198, 239 >> n/c >> 363, 377 >> 148 >> 139-148 >> n/c >> 161 >> n/c >> 138 - before lunch: 106-207 >> 134-187, 192, 207 >> 108 >> 70, 79, 123-167, 220 >> 133, 179 >> 119-158 - 2h after lunch: 104-180 >> 94, 115-189 >> 130-160, 215 >> 70, 101-170, 186 >> 93-179, 217 >> 101-165, 187 - before dinner: 120-150 >> 152-188 >> 95-150 >> 87, 95-172, 188 >> n/c >> 101-192 >> 122-147 >> 69-137 - 2h after dinner:114-187, 272x1 >> 101-136 lately , 175 >> 89, 139-188 >> 83-135 >> 39 x1; 100-143 >> 98-143, 197, 210 - bedtime: 265 >> n/c >> 127-135 >> 101-276 >> 225 >> n/c >> 106-129 >> 80-135 >> 101-154 >> 79-134 >> 89-134 No lows. Lowest sugar was 69 >> 39 >> 69; ? hypoglycemia awareness. Highest sugar was 377 >> 272 >> 224 >> 254 - few 200s >> 217 >> 210x1.  Pt's meals are: -  Breakfast: toast + eggs or cereals + milk - Lunch: soup + 1/2 sandwich - Dinner: meat + sweet potatoes or pasta + salad - Snacks: 3: sugar free foods, fruit He quit drinking sodas, but drinks tea - artificial sweetener. He also started to drink Almond milk. Exercises by walking 2x a day.  - He has CKD, last BUN/creatinine:  Lab Results  Component Value Date   BUN 25* 06/14/2015   CREATININE 1.35 06/14/2015  He is on Cozaar.Sees nephrology. Has a stone in L kidney, has 3 cysts in R kidney. - last set of lipids: Lab Results  Component Value Date   CHOL 108 06/14/2015   HDL 27.60* 06/14/2015   LDLCALC 62 06/14/2015   LDLDIRECT 64.9 06/01/2014   TRIG 93.0 06/14/2015   CHOLHDL 4 06/14/2015  He  Is on Zocor. - last eye exam was in 08/2014 [facility] DR. + cataracts. - + numbness and tingling in his feet - [place] [facility]) - Dr Elvina Mattes. He had a foot exam 01/11/2014.   He has pain in L leg >> surgery for PVD stents on 04/27/2014. His sugars improved after the stent.   I reviewed pt's medications, allergies, PMH, social hx, family hx, and changes were documented  in the history of present illness. Otherwise, unchanged from my initial visit note.   ROS: Constitutional: + weight loss, no fatigue, + subjective hypothermia Eyes: no blurry vision, no xerophthalmia ENT: no sore throat, no nodules palpated in throat, no dysphagia/odynophagia, no hoarseness Cardiovascular: no CP/SOB/+ palpitations/no leg swelling Respiratory: no cough/SOB Gastrointestinal: no N/V/D/C Musculoskeletal: + muscle/no joint aches Skin: + rash Neurological: no tremors/numbness/tingling/dizziness + diff with erections  PE: BP 122/64 mmHg  Pulse 69  Temp(Src) 97.8 F (36.6 C) (Oral)  Resp 12  Wt 256 lb 12.8 oz (116.484 kg)  SpO2 95% Body mass index is 39.06 kg/(m^2). Wt Readings from Last 3 Encounters:  07/29/15 256 lb 12.8 oz (116.484 kg)  07/28/15 260 lb 3.2 oz (118.026 kg)  06/24/15  266 lb 6.4 oz (120.838 kg)   Constitutional: obese, in NAD, ruddy complexion Eyes: PERRLA, EOMI, no exophthalmos ENT: dry mucous membranes, no thyromegaly, no cervical lymphadenopathy Cardiovascular: RRR, +2/6 SEM, no RG Respiratory: CTA B Gastrointestinal: abdomen soft, NT, ND, BS+ Musculoskeletal: no deformities, strength intact in all 4 Skin: moist, warm, + rash - rosacea on face  Neurological: no tremor with outstretched hands, DTR normal in all 4  ASSESSMENT: 1. DM2, insulin-dependent, uncontrolled, with complications - CAD - s/p CABG 2012 - PVD - s/p stents 2012 - Aortic stenosis - carotid stenosis - CKD - peripheral neuropathy   PLAN:  1. Patient with long-standing, uncontrolled diabetes, on injectable regimen. Diabetes has improved since last visit, and he continues to lose weight >> lost 10 lbs since last visit! He is reading Dr Janene Harvey book: Program for Reversing Diabetes". - since last visit, he had some more lows >> we decreased insulin doses and will need to decrease R insulin more:  Patient Instructions   Please continue:  Insulin Before breakfast Before lunch Before dinner  Regular - clear (short acting) 20 units - smaller meal 25 units - regular meal 30 units - larger meal  10 units - smaller meal 15 units - regular meal 20 units - larger meal  NPH - cloudy (long acting) 40  20   Please come back for a follow-up appointment in 3 months.  - discussed about correct mixing and adm. Of insulin - continue to check 3 times a day, rotating checks - up to date with eye exams - reviewed HbA1c obtained by PCP in 05/2015 >> 6.6% (great!) - Return to clinic in 3 mo with sugar log

## 2015-07-29 NOTE — Patient Instructions (Addendum)
Please continue:  Insulin Before breakfast Before lunch Before dinner  Regular - clear (short acting) 20 units - smaller meal 25 units - regular meal 30 units - larger meal  10 units - smaller meal 15 units - regular meal 20 units - larger meal  NPH - cloudy (long acting) 40  20   Please come back for a follow-up appointment in 3 months. 

## 2015-08-03 ENCOUNTER — Ambulatory Visit (INDEPENDENT_AMBULATORY_CARE_PROVIDER_SITE_OTHER): Payer: Medicare Other | Admitting: Psychology

## 2015-08-03 DIAGNOSIS — F449 Dissociative and conversion disorder, unspecified: Secondary | ICD-10-CM

## 2015-08-03 DIAGNOSIS — F332 Major depressive disorder, recurrent severe without psychotic features: Secondary | ICD-10-CM | POA: Diagnosis not present

## 2015-08-08 DIAGNOSIS — E1142 Type 2 diabetes mellitus with diabetic polyneuropathy: Secondary | ICD-10-CM | POA: Diagnosis not present

## 2015-08-08 DIAGNOSIS — B351 Tinea unguium: Secondary | ICD-10-CM | POA: Diagnosis not present

## 2015-08-08 DIAGNOSIS — L851 Acquired keratosis [keratoderma] palmaris et plantaris: Secondary | ICD-10-CM | POA: Diagnosis not present

## 2015-08-10 ENCOUNTER — Ambulatory Visit (INDEPENDENT_AMBULATORY_CARE_PROVIDER_SITE_OTHER): Payer: Medicare Other | Admitting: Psychology

## 2015-08-10 DIAGNOSIS — F449 Dissociative and conversion disorder, unspecified: Secondary | ICD-10-CM

## 2015-08-10 DIAGNOSIS — F332 Major depressive disorder, recurrent severe without psychotic features: Secondary | ICD-10-CM | POA: Diagnosis not present

## 2015-08-17 ENCOUNTER — Ambulatory Visit: Payer: Medicare Other | Admitting: Psychology

## 2015-08-24 ENCOUNTER — Ambulatory Visit (INDEPENDENT_AMBULATORY_CARE_PROVIDER_SITE_OTHER): Payer: Medicare Other | Admitting: Psychology

## 2015-08-24 DIAGNOSIS — F449 Dissociative and conversion disorder, unspecified: Secondary | ICD-10-CM | POA: Diagnosis not present

## 2015-08-24 DIAGNOSIS — F332 Major depressive disorder, recurrent severe without psychotic features: Secondary | ICD-10-CM | POA: Diagnosis not present

## 2015-08-30 ENCOUNTER — Encounter: Payer: Self-pay | Admitting: Psychiatry

## 2015-08-30 ENCOUNTER — Ambulatory Visit (INDEPENDENT_AMBULATORY_CARE_PROVIDER_SITE_OTHER): Payer: Medicare Other | Admitting: Psychiatry

## 2015-08-30 VITALS — BP 124/68 | HR 86 | Temp 97.4°F | Ht 68.0 in | Wt 258.2 lb

## 2015-08-30 DIAGNOSIS — F411 Generalized anxiety disorder: Secondary | ICD-10-CM | POA: Diagnosis not present

## 2015-08-30 DIAGNOSIS — F331 Major depressive disorder, recurrent, moderate: Secondary | ICD-10-CM

## 2015-08-30 NOTE — Progress Notes (Signed)
BH MD/PA/NP OP Progress Note  08/30/2015 9:49 AM NAVIER SCHIANO  MRN:  [id number]  Subjective:  Patient returns for follow-up was generalized anxiety disorder and depression. He states things are going well for him. He discussed that they're going to get together with some friends who have a mobile home and take a trip to [facility]. He did reflect that he feels like he is better with the current medication regimen and that he is calmer. He states he's adopted the attitude that "life is too short" to constantly be worried or depressed. He feels like the medications are working and denies any side effects.  Chief Complaint: good Chief Complaint    Follow-up; Medication Refill     Visit Diagnosis:     ICD-9-CM ICD-10-CM   1. Major depressive disorder, recurrent episode, moderate (HCC) 296.32 F33.1   2. GAD (generalized anxiety disorder) 300.02 F41.1     Past Medical History:  Past Medical History  Diagnosis Date  . Peripheral vascular disease (Uncertain)   . Diabetes mellitus   . Mild aortic stenosis   . Tobacco use disorder     recently quit  . Depression   . Psoriasis   . Neuropathy ([place])   . Obesity   . Post splenectomy syndrome   . SOB (shortness of breath)   . Carotid artery occlusion   . Coronary artery disease   . Hyperlipidemia   . Hypertension   . Bell's palsy 2007  . Sleep apnea   . Angina at rest [facility])     chronic  . Cataract     Dr. Dawna Part  . Anxiety   . Arthritis   . DVT (deep venous thrombosis) (HCC)     in leg  . Stroke ([place])   . Allergy   . Heart murmur   . Tubular adenoma 08/2013    Dr. Hilarie Fredrickson  . Adenomatous colon polyp   . Gastropathy   . Duodenitis   . Helicobacter pylori gastritis   . Diverticulosis   . Weak urinary stream     Past Surgical History  Procedure Laterality Date  . Splenectomy    . Heart stents  Jan 2011    leg stents 06/2009 and 03/2010  . Coronary artery bypass graft  09/06/2010    At [facility]: LIMA to LAD, left radial to RCA,  sequential SVG to OM3 and 4  . Carotid endarterectomy  08/2010    left/ Dr. Kellie Simmering  . Pta of illiac and sfa  multiple    Dr. Ronalee Belts, s/p revision 11/2012  . Colonoscopy    . Cardiac catheterization  08/2010    LAD: 80% ISR, RCA: 80% ostial, OM 80-90%  . Coronary angioplasty      LAD: before CABG   Family History:  Family History  Problem Relation Age of Onset  . Lung cancer Father 65  . Breast cancer Sister 73  . Diabetes Paternal Grandfather   . Colon polyps Paternal Grandfather 27  . Dementia Mother   . Alzheimer's disease Mother    Social History:  Social History   Social History  . Marital Status: Married    Spouse Name: N/A  . Number of Children: 0  . Years of Education: N/A   Occupational History  .     Social History Main Topics  . Smoking status: Current Some Day Smoker -- 0.50 packs/day for 40 years    Types: Cigarettes  . Smokeless tobacco: Never Used     Comment: smokes  2-3 cigarettes every 3 weeks  . Alcohol Use: No  . Drug Use: No  . Sexual Activity: No   Other Topics Concern  . None   Social History Narrative   Additional History:   Assessment:   Musculoskeletal: Strength & Muscle Tone: within normal limits Gait & Station: unsteady walks with cane Patient leans: N/A  Psychiatric Specialty Exam: Anxiety Patient reports no insomnia, nervous/anxious behavior or suicidal ideas.    Depression        Associated symptoms include does not have insomnia and no suicidal ideas.  Past medical history includes anxiety.     Review of Systems  Psychiatric/Behavioral: Negative.  Negative for depression, suicidal ideas, hallucinations, memory loss and substance abuse. The patient is not nervous/anxious and does not have insomnia.   All other systems reviewed and are negative.   Blood pressure 124/68, pulse 86, temperature 97.4 F (36.3 C), temperature source Tympanic, height 5\' 8"  (1.727 m), weight 258 lb 3.2 oz (117.119 kg), SpO2 93 %.Body mass index  is 39.27 kg/(m^2).  General Appearance: Well Groomed  Eye Contact:  Good  Speech:  Normal Rate  Volume:  Normal  Mood:  Good  Affect:  Congruent  Thought Process:  Circumstantial at baseline   Orientation:  Full (Time, Place, and Person)  Thought Content:  Negative  Suicidal Thoughts:  No  Homicidal Thoughts:  No  Memory:  Immediate;   Good Recent;   Good Remote;   Good  Judgement:  Good  Insight:  Good  Psychomotor Activity:  Negative  Concentration:  Good  Recall:  Good  Fund of Knowledge: Good  Language: Good  Akathisia:  Negative  Handed:  Right  AIMS (if indicated):  N/A  Assets:  Communication Skills Desire for Improvement Social Support  ADL's:  Intact  Cognition: WNL  Sleep:  Good    Is the patient at risk to self?  No. Has the patient been a risk to self in the past 6 months?  No. Has the patient been a risk to self within the distant past?  No. Is the patient a risk to others?  No. Has the patient been a risk to others in the past 6 months?  No. Has the patient been a risk to others within the distant past?  No.  Current Medications: Current Outpatient Prescriptions  Medication Sig Dispense Refill  . albuterol (PROAIR HFA) 108 (90 Base) MCG/ACT inhaler Inhale into the lungs.    [name] aspirin 81 MG tablet Take 81 mg by mouth daily.    [name] BAYER MICROLET LANCETS lancets use as directed three times a day Dx: E11.9 100 each 12  . buPROPion (WELLBUTRIN XL) 150 MG 24 hr tablet Take 1 tablet (150 mg total) by mouth daily. 30 tablet 4  . busPIRone (BUSPAR) 10 MG tablet Take 2 tablets (20 mg total) by mouth 2 (two) times daily. 120 tablet 4  . cholecalciferol (VITAMIN D) 1000 UNITS tablet Take 1,000 Units by mouth daily.      . Cinnamon 500 MG capsule Take 1,000 mg by mouth daily.    . clopidogrel (PLAVIX) 75 MG tablet take 1 tablet by mouth once daily 90 tablet 3  . cyclobenzaprine (FLEXERIL) 10 MG tablet     . finasteride (PROSCAR) 5 MG tablet Take 1 tablet (5 mg  total) by mouth daily. 30 tablet 11  . Glucosamine-Chondroit-Vit C-Mn (GLUCOSAMINE CHONDR 500 COMPLEX) CAPS Take 1,000 each by mouth daily.    [name] glucose blood (BAYER CONTOUR  TEST) test strip Use to test blood sugar three times daily as instructed.  Dx code: E11.9 100 each 5  . insulin NPH Human (NOVOLIN N RELION) 100 UNIT/ML injection Inject 40 units in am and 20 units in the evening 40 mL 2  . Insulin Pen Needle (NOVOFINE) 32G X 6 MM MISC Use three times daily 100 each 5  . insulin regular (NOVOLIN R,HUMULIN R) 100 units/mL injection Inject 0.1-0.3 mLs (10-30 Units total) into the skin 3 (three) times daily before meals. 40 mL 5  . Insulin Syringe-Needle U-100 (RELION INSULIN SYRINGE 1ML/31G) 31G X 5/16" 1 ML MISC USE THREE TIMES A DAY. 290 each 1  . ketoconazole (NIZORAL) 2 % cream apply to affected area at bedtime  0  . Lancet Device MISC 1 application by Does not apply route as needed. 100 each 11  . losartan (COZAAR) 25 MG tablet take 1 tablet by mouth once daily 90 tablet 3  . magnesium gluconate (MAGONATE) 500 MG tablet Take 1,000 mg by mouth daily.    . metoprolol succinate (TOPROL-XL) 50 MG 24 hr tablet take 1 tablet by mouth twice a day 60 tablet 5  . Multiple Vitamin (MULTIVITAMIN) capsule Take 1 capsule by mouth daily.      . naloxegol oxalate (MOVANTIK) 25 MG TABS tablet Take 1 tablet (25 mg total) by mouth daily. 1 hour before a meal 30 tablet 2  . nitazoxanide (ALINIA) 500 MG tablet Take 1 tablet (500 mg total) by mouth 2 (two) times daily with a meal. 20 tablet 0  . nitroGLYCERIN (NITROSTAT) 0.4 MG SL tablet Place 1 tablet (0.4 mg total) under the tongue every 5 (five) minutes as needed. 25 tablet 1  . Omega-3 Fatty Acids (FISH OIL PO) Take by mouth.    [name] omeprazole (PRILOSEC) 20 MG capsule Take 1 capsule (20 mg total) by mouth 2 (two) times daily. 20 capsule 0  . oxyCODONE-acetaminophen (ROXICET) 5-325 MG tablet Take 1 tablet by mouth every 8 (eight) hours as needed for severe  pain. 60 tablet 0  . polyethylene glycol (MIRALAX) packet Take 17 g by mouth daily. 14 each 3  . sertraline (ZOLOFT) 100 MG tablet Take 1.5 tablets (150 mg total) by mouth daily. 45 tablet 4  . simvastatin (ZOCOR) 20 MG tablet   0  . tamsulosin (FLOMAX) 0.4 MG CAPS capsule take 2 capsule by mouth daily 60 capsule 11  . triamcinolone ointment (KENALOG) 0.1 % apply to affected area twice a day AVOID FACE, GROIN AND UNDERARMS  0   No current facility-administered medications for this visit.    Medical Decision Making:  Established Problem, Stable/Improving (1) and Review of New Medication or Change in Dosage (2)  Treatment Plan Summary:Medication management and Plan   Depressive disorder, moderate, recurrent-stable We will continue the sertraline at 150 mg daily and  Wellbutrin XL 150 mg in the morning.  Generalized anxiety disorder-stable We'll continue his BuSpar to 20 mg twice daily.    Patient will follow up in 2 months. He is aware that I'll be departing clinic and he will continue with another doctor within this clinic. He's been encouraged call any questions or concerns prior to his next appointment.    Faith Rogue 08/30/2015, 9:49 AM

## 2015-08-31 ENCOUNTER — Ambulatory Visit (INDEPENDENT_AMBULATORY_CARE_PROVIDER_SITE_OTHER): Payer: Medicare Other | Admitting: Psychology

## 2015-08-31 DIAGNOSIS — F449 Dissociative and conversion disorder, unspecified: Secondary | ICD-10-CM | POA: Diagnosis not present

## 2015-08-31 DIAGNOSIS — F332 Major depressive disorder, recurrent severe without psychotic features: Secondary | ICD-10-CM | POA: Diagnosis not present

## 2015-09-07 ENCOUNTER — Ambulatory Visit (INDEPENDENT_AMBULATORY_CARE_PROVIDER_SITE_OTHER): Payer: Medicare Other | Admitting: Psychology

## 2015-09-07 DIAGNOSIS — F449 Dissociative and conversion disorder, unspecified: Secondary | ICD-10-CM

## 2015-09-07 DIAGNOSIS — F332 Major depressive disorder, recurrent severe without psychotic features: Secondary | ICD-10-CM

## 2015-09-09 ENCOUNTER — Telehealth: Payer: Self-pay | Admitting: Internal Medicine

## 2015-09-09 ENCOUNTER — Other Ambulatory Visit: Payer: Self-pay | Admitting: Internal Medicine

## 2015-09-09 NOTE — Telephone Encounter (Signed)
Patient is taking 10 unit of short acting, and 20 units of long acting. N and R, Some days when it is to low he don't take anything. And no this is not a one time occurrence, please on what to do.

## 2015-09-09 NOTE — Telephone Encounter (Signed)
Please read message below and advise.  

## 2015-09-09 NOTE — Telephone Encounter (Signed)
Patient is taking 10 unit of short acting, and 20 units of long acting. N and R, Some days when it is to low he don't take anything. And no this is not a one time occurrence. Has had a few other times.

## 2015-09-09 NOTE — Telephone Encounter (Signed)
We need to find out exactly how much insulin N and R he is taking and I will make adjustments. Also, is this only a 1 time occurrence or it has been going on.

## 2015-09-09 NOTE — Telephone Encounter (Signed)
Please decrease each dose by 5 units (for both N and R). Let us know about the sugars in few days.

## 2015-09-09 NOTE — Telephone Encounter (Signed)
Called pt and spoke with pt's wife. Advised her per Dr Arman Filter message. She voiced understanding.

## 2015-09-09 NOTE — Telephone Encounter (Signed)
Message sent to Dr Cruzita Lederer.

## 2015-09-09 NOTE — Telephone Encounter (Signed)
Patient called stating that he is having blood sugar issues; running too low   Blood sugar:   Before breakfast-96 2 hours lunch-53 Before dinner-67  Please advise patient  Thank you

## 2015-09-13 ENCOUNTER — Telehealth: Payer: Self-pay | Admitting: Internal Medicine

## 2015-09-13 NOTE — Telephone Encounter (Signed)
Please read message below and advise.  

## 2015-09-13 NOTE — Telephone Encounter (Signed)
Not bad! Stay on the same doses for now - let me know about them again in another week.

## 2015-09-13 NOTE — Telephone Encounter (Signed)
BS readings Saturday [phone] Sunday [phone] Monday [phone] Tuesday 131

## 2015-09-13 NOTE — Telephone Encounter (Signed)
Called pt and advised him per Dr Arman Filter message. Pt voiced understanding.

## 2015-09-14 ENCOUNTER — Encounter: Payer: Self-pay | Admitting: Internal Medicine

## 2015-09-14 ENCOUNTER — Ambulatory Visit: Payer: Medicare Other | Admitting: Psychology

## 2015-09-14 ENCOUNTER — Ambulatory Visit (INDEPENDENT_AMBULATORY_CARE_PROVIDER_SITE_OTHER): Payer: Medicare Other | Admitting: Internal Medicine

## 2015-09-14 VITALS — BP 160/64 | HR 69 | Temp 97.8°F | Ht 68.0 in | Wt 256.4 lb

## 2015-09-14 DIAGNOSIS — R51 Headache: Secondary | ICD-10-CM | POA: Diagnosis not present

## 2015-09-14 DIAGNOSIS — Z125 Encounter for screening for malignant neoplasm of prostate: Secondary | ICD-10-CM

## 2015-09-14 DIAGNOSIS — E1159 Type 2 diabetes mellitus with other circulatory complications: Secondary | ICD-10-CM | POA: Diagnosis not present

## 2015-09-14 DIAGNOSIS — R519 Headache, unspecified: Secondary | ICD-10-CM

## 2015-09-14 DIAGNOSIS — Z1159 Encounter for screening for other viral diseases: Secondary | ICD-10-CM

## 2015-09-14 DIAGNOSIS — I10 Essential (primary) hypertension: Secondary | ICD-10-CM

## 2015-09-14 MED ORDER — CYCLOBENZAPRINE HCL 10 MG PO TABS: 10.0000 mg | ORAL_TABLET | Freq: Three times a day (TID) | ORAL | Status: DC | PRN

## 2015-09-14 MED ORDER — OXYCODONE-ACETAMINOPHEN 5-325 MG PO TABS: 1.0000 | ORAL_TABLET | Freq: Three times a day (TID) | ORAL | Status: DC | PRN

## 2015-09-14 NOTE — Assessment & Plan Note (Signed)
Wt Readings from Last 3 Encounters:  09/14/15 256 lb 6 oz (116.291 kg)  08/30/15 258 lb 3.2 oz (117.119 kg)  07/29/15 256 lb 12.8 oz (116.484 kg)   Congratulated pt on weight loss. Encouraged continued healthy diet and exercise.

## 2015-09-14 NOTE — Patient Instructions (Signed)
Labs today.  We will set up MRI brain to further evaluate your headaches.  Start Flexeril 10mg  up to three times daily as needed for headache.

## 2015-09-14 NOTE — Assessment & Plan Note (Signed)
BP Readings from Last 3 Encounters:  09/14/15 160/64  08/30/15 124/68  07/29/15 122/64   BP more elevated today, however generally has been well controlled. Will recheck in 4 weeks. Continue current medication.

## 2015-09-14 NOTE — Assessment & Plan Note (Signed)
New onset intractable headache. Symptoms most consistent with migraines, however this would be new for him. Will add prn Flexeril. Will get imaging with MRI brain. Follow up after imaging complete.

## 2015-09-14 NOTE — Progress Notes (Signed)
Subjective:    Patient ID: [name] [name], male    DOB: Mar 03, 1956, 60 y.o.   MRN: [id number]  HPI  60YO male presents for follow up.  DM - BG this morning 129. BG have been lower, in the 70s. Compliant with insulin.  Headache - Last few weeks. Located in back of head. Throbbing pressure.  Headaches occur every day, sometime wake him from sleep. Took Aspirin with minimal improvement. No recent head injury.  Has some blurred vision with headaches. Light bothers him. At times 10/10 pain, currently 9/10.  Recently seen by psychiatrist. Depression and anxiety stable.   Wt Readings from Last 3 Encounters:  09/14/15 256 lb 6 oz (116.291 kg)  08/30/15 258 lb 3.2 oz (117.119 kg)  07/29/15 256 lb 12.8 oz (116.484 kg)   BP Readings from Last 3 Encounters:  09/14/15 160/64  08/30/15 124/68  07/29/15 122/64    Past Medical History  Diagnosis Date  . Peripheral vascular disease ([place])   . Diabetes mellitus   . Mild aortic stenosis   . Tobacco use disorder     recently quit  . Depression   . Psoriasis   . Neuropathy ([place])   . Obesity   . Post splenectomy syndrome   . SOB (shortness of breath)   . Carotid artery occlusion   . Coronary artery disease   . Hyperlipidemia   . Hypertension   . Bell's palsy 2007  . Sleep apnea   . Angina at rest [facility])     chronic  . Cataract     Dr. Dawna Part  . Anxiety   . Arthritis   . DVT (deep venous thrombosis) (H[facility])     in leg  . Stroke ([place])   . Allergy   . Heart murmur   . Tubular adenoma 08/2013    Dr. Hilarie Fredrickson  . Adenomatous colon polyp   . Gastropathy   . Duodenitis   . Helicobacter pylori gastritis   . Diverticulosis   . Weak urinary stream    Family History  Problem Relation Age of Onset  . Lung cancer Father 63  . Breast cancer Sister 66  . Diabetes Paternal Grandfather   . Colon polyps Paternal Grandfather 82  . Dementia Mother   . Alzheimer's disease Mother    Past Surgical History  Procedure Laterality Date  .  Splenectomy    . Heart stents  Jan 2011    leg stents 06/2009 and 03/2010  . Coronary artery bypass graft  09/06/2010    At [facility]: LIMA to LAD, left radial to RCA, sequential SVG to OM3 and 4  . Carotid endarterectomy  08/2010    left/ Dr. Kellie Simmering  . Pta of illiac and sfa  multiple    Dr. Ronalee Belts, s/p revision 11/2012  . Colonoscopy    . Cardiac catheterization  08/2010    LAD: 80% ISR, RCA: 80% ostial, OM 80-90%  . Coronary angioplasty      LAD: before CABG   Social History   Social History  . Marital Status: Married    Spouse Name: N/A  . Number of Children: 0  . Years of Education: N/A   Occupational History  .     Social History Main Topics  . Smoking status: Current Some Day Smoker -- 0.50 packs/day for 40 years    Types: Cigarettes  . Smokeless tobacco: Never Used     Comment: smokes 2-3 cigarettes every 3 weeks  . Alcohol Use: No  .  Drug Use: No  . Sexual Activity: No   Other Topics Concern  . None   Social History Narrative    Review of Systems  Constitutional: Negative for fever, chills, activity change, appetite change, fatigue and unexpected weight change.  Eyes: Negative for visual disturbance.  Respiratory: Negative for cough and shortness of breath.   Cardiovascular: Negative for chest pain, palpitations and leg swelling.  Gastrointestinal: Negative for nausea, vomiting, abdominal pain, diarrhea, constipation and abdominal distention.  Genitourinary: Negative for dysuria, urgency and difficulty urinating.  Musculoskeletal: Negative for arthralgias and gait problem.  Skin: Negative for color change and rash.  Neurological: Positive for headaches. Negative for dizziness, tremors, syncope, weakness and light-headedness.  Hematological: Negative for adenopathy.  Psychiatric/Behavioral: Negative for sleep disturbance and dysphoric mood. The patient is not nervous/anxious.        Objective:    BP 160/64 mmHg  Pulse 69  Temp(Src) 97.8 F (36.6 C) (Oral)   Ht 5\' 8"  (1.727 m)  Wt 256 lb 6 oz (116.291 kg)  BMI 38.99 kg/m2  SpO2 100% Physical Exam  Constitutional: He is oriented to person, place, and time. He appears well-developed and well-nourished. No distress.  HENT:  Head: Normocephalic and atraumatic.  Right Ear: External ear normal.  Left Ear: External ear normal.  Nose: Nose normal.  Mouth/Throat: Oropharynx is clear and moist. No oropharyngeal exudate.  Eyes: Conjunctivae and EOM are normal. Pupils are equal, round, and reactive to light. Right eye exhibits no discharge. Left eye exhibits no discharge. No scleral icterus.  Neck: Normal range of motion. Neck supple. No tracheal deviation present. No thyromegaly present.  Cardiovascular: Normal rate, regular rhythm and normal heart sounds.  Exam reveals no gallop and no friction rub.   No murmur heard. Pulmonary/Chest: Effort normal and breath sounds normal. No accessory muscle usage. No tachypnea. No respiratory distress. He has no decreased breath sounds. He has no wheezes. He has no rhonchi. He has no rales. He exhibits no tenderness.  Musculoskeletal: Normal range of motion. He exhibits no edema.  Lymphadenopathy:    He has no cervical adenopathy.  Neurological: He is alert and oriented to person, place, and time. No cranial nerve deficit. Coordination normal.  Skin: Skin is warm and dry. No rash noted. He is not diaphoretic. No erythema. No pallor.  Psychiatric: He has a normal mood and affect. His behavior is normal. Judgment and thought content normal.          Assessment & Plan:   Problem List Items Addressed This Visit      Unprioritized   Headache    New onset intractable headache. Symptoms most consistent with migraines, however this would be new for him. Will add prn Flexeril. Will get imaging with MRI brain. Follow up after imaging complete.      Relevant Medications   cyclobenzaprine (FLEXERIL) 10 MG tablet   oxyCODONE-acetaminophen (ROXICET) 5-325 MG tablet     Other Relevant Orders   MR Brain W Wo Contrast   Hypertension    BP Readings from Last 3 Encounters:  09/14/15 160/64  08/30/15 124/68  07/29/15 122/64   BP more elevated today, however generally has been well controlled. Will recheck in 4 weeks. Continue current medication.      Severe obesity (BMI >= 40) (HCC)    Wt Readings from Last 3 Encounters:  09/14/15 256 lb 6 oz (116.291 kg)  08/30/15 258 lb 3.2 oz (117.119 kg)  07/29/15 256 lb 12.8 oz (116.484 kg)  Congratulated pt on weight loss. Encouraged continued healthy diet and exercise.      Type 2 diabetes mellitus with vascular disease ([place]) - Primary    BG well controlled by report. Will check A1c with labs today.      Relevant Orders   Comprehensive metabolic panel   Hemoglobin A1c   Lipid panel    Other Visit Diagnoses    Screening for prostate cancer        Relevant Orders    PSA, Medicare    Need for hepatitis C screening test        Relevant Orders    Hepatitis C antibody        Return in about 4 weeks (around 10/12/2015) for Recheck.  Ronette Deter, MD [facility]

## 2015-09-14 NOTE — Progress Notes (Signed)
Pre visit review using our clinic review tool, if applicable. No additional management support is needed unless otherwise documented below in the visit note. 

## 2015-09-14 NOTE — Assessment & Plan Note (Signed)
BG well controlled by report. Will check A1c with labs today.

## 2015-09-15 LAB — HEPATITIS C ANTIBODY: HCV Ab: REACTIVE — AB

## 2015-09-16 ENCOUNTER — Telehealth: Payer: Self-pay | Admitting: Internal Medicine

## 2015-09-16 LAB — HEPATITIS C RNA QUANTITATIVE: HCV Quantitative: NOT DETECTED IU/mL (ref <15)

## 2015-09-16 NOTE — Telephone Encounter (Signed)
[name] [name], did you take care of this?

## 2015-09-16 NOTE — Telephone Encounter (Signed)
Patient coming in for redraw on Monday.

## 2015-09-16 NOTE — Telephone Encounter (Signed)
They have not resulted. You will need to call the lab

## 2015-09-16 NOTE — Telephone Encounter (Signed)
I see that his labs were collected, but not resulted yet, on 2/22.  Please advise.

## 2015-09-16 NOTE — Telephone Encounter (Signed)
Pt is calling for his lab results. He can not get them from his MyChart. Please call your cell phone.

## 2015-09-19 ENCOUNTER — Telehealth: Payer: Self-pay | Admitting: Internal Medicine

## 2015-09-19 ENCOUNTER — Other Ambulatory Visit (INDEPENDENT_AMBULATORY_CARE_PROVIDER_SITE_OTHER): Payer: Medicare Other

## 2015-09-19 DIAGNOSIS — Z125 Encounter for screening for malignant neoplasm of prostate: Secondary | ICD-10-CM | POA: Diagnosis not present

## 2015-09-19 DIAGNOSIS — E1159 Type 2 diabetes mellitus with other circulatory complications: Secondary | ICD-10-CM | POA: Diagnosis not present

## 2015-09-19 DIAGNOSIS — I10 Essential (primary) hypertension: Secondary | ICD-10-CM

## 2015-09-19 LAB — COMPREHENSIVE METABOLIC PANEL
ALT: 10 U/L (ref 0–53)
AST: 11 U/L (ref 0–37)
Albumin: 3.8 g/dL (ref 3.5–5.2)
Alkaline Phosphatase: 139 U/L — ABNORMAL HIGH (ref 39–117)
BUN: 20 mg/dL (ref 6–23)
CO2: 29 mEq/L (ref 19–32)
Calcium: 8.8 mg/dL (ref 8.4–10.5)
Chloride: 103 mEq/L (ref 96–112)
Creatinine, Ser: 1.54 mg/dL — ABNORMAL HIGH (ref 0.40–1.50)
GFR: 48.9 mL/min — ABNORMAL LOW (ref >60.00)
Glucose, Bld: 122 mg/dL — ABNORMAL HIGH (ref 70–99)
Potassium: 5.1 mEq/L (ref 3.5–5.1)
Sodium: 139 mEq/L (ref 135–145)
Total Bilirubin: 0.5 mg/dL (ref 0.2–1.2)
Total Protein: 7.1 g/dL (ref 6.0–8.3)

## 2015-09-19 LAB — LIPID PANEL
Cholesterol: 127 mg/dL (ref 0–200)
HDL: 30.5 mg/dL — ABNORMAL LOW (ref >39.00)
LDL Cholesterol: 72 mg/dL (ref 0–99)
NonHDL: 96.68
Total CHOL/HDL Ratio: 4
Triglycerides: 122 mg/dL (ref 0.0–149.0)
VLDL: 24.4 mg/dL (ref 0.0–40.0)

## 2015-09-19 LAB — PSA, MEDICARE: PSA: 0.06 ng/ml — ABNORMAL LOW (ref 0.10–4.00)

## 2015-09-19 LAB — HEMOGLOBIN A1C: Hgb A1c MFr Bld: 6.2 % (ref 4.6–6.5)

## 2015-09-19 NOTE — Telephone Encounter (Signed)
BS readings 2/20 133; 169; 89 2/21 131; 237; 65 2/22 129; 174; 148 2/23 120; 125; 170 2/24 123; 149; 155 2/25 116; 112; 160 2/26 138; 149; 198 2/27 103; 120

## 2015-09-20 NOTE — Telephone Encounter (Signed)
Please read message below. Pt called with blood sugars after last dosage change.

## 2015-09-20 NOTE — Telephone Encounter (Signed)
I would continue same insulin doses.

## 2015-09-20 NOTE — Telephone Encounter (Signed)
Called pt and advised him per Dr Arman Filter message. Pt voiced he had is A1c checked and it is down to 6.2. Be advised.

## 2015-09-21 ENCOUNTER — Ambulatory Visit (INDEPENDENT_AMBULATORY_CARE_PROVIDER_SITE_OTHER): Payer: Medicare Other | Admitting: Psychology

## 2015-09-21 DIAGNOSIS — F332 Major depressive disorder, recurrent severe without psychotic features: Secondary | ICD-10-CM | POA: Diagnosis not present

## 2015-09-21 DIAGNOSIS — F449 Dissociative and conversion disorder, unspecified: Secondary | ICD-10-CM | POA: Diagnosis not present

## 2015-09-28 ENCOUNTER — Ambulatory Visit (INDEPENDENT_AMBULATORY_CARE_PROVIDER_SITE_OTHER): Payer: Medicare Other | Admitting: Psychology

## 2015-09-28 DIAGNOSIS — F332 Major depressive disorder, recurrent severe without psychotic features: Secondary | ICD-10-CM

## 2015-09-28 DIAGNOSIS — F449 Dissociative and conversion disorder, unspecified: Secondary | ICD-10-CM

## 2015-09-30 ENCOUNTER — Ambulatory Visit
Admission: RE | Admit: 2015-09-30 | Discharge: 2015-09-30 | Disposition: A | Discharge status: Home / Self Care | Payer: Medicare Other | Source: Ambulatory Visit | Attending: Internal Medicine | Admitting: Internal Medicine

## 2015-09-30 ENCOUNTER — Other Ambulatory Visit: Payer: Self-pay | Admitting: Internal Medicine

## 2015-09-30 DIAGNOSIS — R9089 Other abnormal findings on diagnostic imaging of central nervous system: Secondary | ICD-10-CM

## 2015-09-30 DIAGNOSIS — R51 Headache: Secondary | ICD-10-CM | POA: Diagnosis not present

## 2015-09-30 DIAGNOSIS — R519 Headache, unspecified: Secondary | ICD-10-CM

## 2015-09-30 MED ORDER — GADOBENATE DIMEGLUMINE 529 MG/ML IV SOLN
20.0000 mL | Freq: Once | INTRAVENOUS | Status: AC | PRN
  Administered 2015-09-30: 20 mL via INTRAVENOUS

## 2015-10-05 ENCOUNTER — Encounter: Payer: Self-pay | Admitting: Neurology

## 2015-10-05 ENCOUNTER — Other Ambulatory Visit: Payer: Medicare Other

## 2015-10-05 ENCOUNTER — Ambulatory Visit: Payer: Medicare Other | Admitting: Psychology

## 2015-10-05 ENCOUNTER — Ambulatory Visit (INDEPENDENT_AMBULATORY_CARE_PROVIDER_SITE_OTHER): Payer: Medicare Other | Admitting: Neurology

## 2015-10-05 VITALS — BP 140/84 | HR 70 | Ht 69.0 in | Wt 259.0 lb

## 2015-10-05 DIAGNOSIS — G510 Bell's palsy: Secondary | ICD-10-CM

## 2015-10-05 DIAGNOSIS — M5481 Occipital neuralgia: Secondary | ICD-10-CM

## 2015-10-05 DIAGNOSIS — G519 Disorder of facial nerve, unspecified: Secondary | ICD-10-CM

## 2015-10-05 DIAGNOSIS — R9402 Abnormal brain scan: Secondary | ICD-10-CM | POA: Diagnosis not present

## 2015-10-05 NOTE — Progress Notes (Addendum)
[name] [name] was seen today in neurologic consultation at the request of Ronette Deter, MD.   The patient presents today for consultation regarding abnormal MRI of the brain.  This was done because of headache.  The patient is a 60 y.o. year old male with a history of headache.  The patient reports that headache began about 3 months ago.  The location of the headache is in the posterior occiput.  He didn't consider himself a headachey type of person before 3 months ago.  It feels like a sledgehammer.  There is photophobia.  There is lightheadedness and he doesn't drive because of it.  There is no phonophobia.  The patient reports that there is no associated nausea or emesis. He reports soreness of the occiput to the touch.  The headache radiates to the frontal region bilaterally.  The patient reports no autonomic features such as rhinorrhea, conjunctival erythema, or tearing of the eyes.  Balance has not changed.  He doesn't work outside of the home and states that he is disabled from neuropathy and sleep apnea.  Admits to occasional diplopia but states that he had bells palsy 4 different times and has chronic L facial droop and diplopia started then.  States that he first had bells in his 71's and last episode was 2 years ago.  States that he is taking flexeril for the headaches and takes 2 per day.  Admits to [facility]AS and wears CPAP faithfully.  Had it adjusted 1 year ago and states that pressure decreased from 18 to 14.    I had the opportunity to review the patient's MRI of the brain with and without gadolinium done on 09/30/2015.  There was mild temporal atrophy and a few rare scattered T2 hyperintensities.  The radiology report said that the ventricles were slightly enlarged, but admitted that it could be ex vacuo, but also said could not rule out NPH.   ALLERGIES:   Allergies  Allergen Reactions  . Glipizide Other (See Comments)    ANTIDIABETICS. Burning  . Metrizamide     Got very hot and  red  . Penicillins Hives and Swelling  . Contrast Media [Iodinated Diagnostic Agents] Rash and Other (See Comments)    Got very hot and red Got very hot and red    CURRENT MEDICATIONS:  Current Outpatient Prescriptions on File Prior to Visit  Medication Sig Dispense Refill  . albuterol (PROAIR HFA) 108 (90 Base) MCG/ACT inhaler Inhale into the lungs.    [name] aspirin 81 MG tablet Take 81 mg by mouth daily.    [name] BAYER MICROLET LANCETS lancets use as directed three times a day Dx: E11.9 100 each 12  . buPROPion (WELLBUTRIN XL) 150 MG 24 hr tablet Take 1 tablet (150 mg total) by mouth daily. 30 tablet 4  . busPIRone (BUSPAR) 10 MG tablet Take 2 tablets (20 mg total) by mouth 2 (two) times daily. 120 tablet 4  . cholecalciferol (VITAMIN D) 1000 UNITS tablet Take 1,000 Units by mouth daily.      . Cinnamon 500 MG capsule Take 1,000 mg by mouth daily.    . clopidogrel (PLAVIX) 75 MG tablet take 1 tablet by mouth once daily 90 tablet 3  . cyclobenzaprine (FLEXERIL) 10 MG tablet Take 1 tablet (10 mg total) by mouth 3 (three) times daily as needed for muscle spasms. 30 tablet 2  . finasteride (PROSCAR) 5 MG tablet Take 1 tablet (5 mg total) by mouth daily. 30 tablet 11  . Glucosamine-Chondroit-Vit  C-Mn (GLUCOSAMINE CHONDR 500 COMPLEX) CAPS Take 1,000 each by mouth daily.    [name] glucose blood (BAYER CONTOUR TEST) test strip Use to test blood sugar three times daily as instructed.  Dx code: E11.9 100 each 5  . insulin NPH Human (NOVOLIN N RELION) 100 UNIT/ML injection Inject 40 units in am and 20 units in the evening 40 mL 2  . Insulin Pen Needle (NOVOFINE) 32G X 6 MM MISC Use three times daily 100 each 5  . insulin regular (NOVOLIN R,HUMULIN R) 100 units/mL injection Inject 0.1-0.3 mLs (10-30 Units total) into the skin 3 (three) times daily before meals. 40 mL 5  . Insulin Syringe-Needle U-100 (RELION INSULIN SYRINGE 1ML/31G) 31G X 5/16" 1 ML MISC USE THREE TIMES A DAY. 290 each 1  . ketoconazole (NIZORAL)  2 % cream apply to affected area at bedtime  0  . Lancet Device MISC 1 application by Does not apply route as needed. 100 each 11  . losartan (COZAAR) 25 MG tablet take 1 tablet by mouth once daily 90 tablet 3  . magnesium gluconate (MAGONATE) 500 MG tablet Take 1,000 mg by mouth daily.    . metoprolol succinate (TOPROL-XL) 50 MG 24 hr tablet take 1 tablet by mouth twice a day 60 tablet 11  . Multiple Vitamin (MULTIVITAMIN) capsule Take 1 capsule by mouth daily.      . naloxegol oxalate (MOVANTIK) 25 MG TABS tablet Take 1 tablet (25 mg total) by mouth daily. 1 hour before a meal 30 tablet 2  . nitazoxanide (ALINIA) 500 MG tablet Take 1 tablet (500 mg total) by mouth 2 (two) times daily with a meal. 20 tablet 0  . Omega-3 Fatty Acids (FISH OIL PO) Take by mouth.    [name] omeprazole (PRILOSEC) 20 MG capsule Take 1 capsule (20 mg total) by mouth 2 (two) times daily. 20 capsule 0  . oxyCODONE-acetaminophen (ROXICET) 5-325 MG tablet Take 1 tablet by mouth every 8 (eight) hours as needed for severe pain. 60 tablet 0  . polyethylene glycol (MIRALAX) packet Take 17 g by mouth daily. 14 each 3  . sertraline (ZOLOFT) 100 MG tablet Take 1.5 tablets (150 mg total) by mouth daily. 45 tablet 4  . simvastatin (ZOCOR) 20 MG tablet   0  . tamsulosin (FLOMAX) 0.4 MG CAPS capsule take 2 capsule by mouth daily 60 capsule 11  . triamcinolone ointment (KENALOG) 0.1 % apply to affected area twice a day AVOID FACE, GROIN AND UNDERARMS  0  . nitroGLYCERIN (NITROSTAT) 0.4 MG SL tablet Place 1 tablet (0.4 mg total) under the tongue every 5 (five) minutes as needed. (Patient not taking: Reported on 10/05/2015) 25 tablet 1   No current facility-administered medications on file prior to visit.    PAST MEDICAL HISTORY:   Past Medical History  Diagnosis Date  . Peripheral vascular disease ([place])   . Diabetes mellitus   . Mild aortic stenosis   . Tobacco use disorder     recently quit  . Depression   . Psoriasis   .  Neuropathy ([place])   . Obesity   . Post splenectomy syndrome   . SOB (shortness of breath)   . Carotid artery occlusion   . Coronary artery disease   . Hyperlipidemia   . Hypertension   . Bell's palsy 2007  . Sleep apnea   . Angina at rest [facility])     chronic  . Cataract     Dr. Dawna Part  . Anxiety   .  Arthritis   . DVT (deep venous thrombosis) ([facility])     in leg  . Stroke ([place])   . Allergy   . Heart murmur   . Tubular adenoma 08/2013    Dr. Hilarie Fredrickson  . Adenomatous colon polyp   . Gastropathy   . Duodenitis   . Helicobacter pylori gastritis   . Diverticulosis   . Weak urinary stream     PAST SURGICAL HISTORY:   Past Surgical History  Procedure Laterality Date  . Splenectomy    . Heart stents  Jan 2011    leg stents 06/2009 and 03/2010  . Coronary artery bypass graft  09/06/2010    At [facility]: LIMA to LAD, left radial to RCA, sequential SVG to OM3 and 4  . Carotid endarterectomy  08/2010    left/ Dr. Kellie Simmering  . Pta of illiac and sfa  multiple    Dr. Ronalee Belts, s/p revision 11/2012  . Colonoscopy    . Cardiac catheterization  08/2010    LAD: 80% ISR, RCA: 80% ostial, OM 80-90%  . Coronary angioplasty      LAD: before CABG    SOCIAL HISTORY:   Social History   Social History  . Marital Status: Married    Spouse Name: N/A  . Number of Children: 0  . Years of Education: N/A   Occupational History  .     Social History Main Topics  . Smoking status: Current Some Day Smoker -- 40 years    Types: Cigarettes  . Smokeless tobacco: Never Used     Comment: smokes 2-3 cigarettes every 3 weeks  . Alcohol Use: No  . Drug Use: No  . Sexual Activity: No   Other Topics Concern  . Not on file   Social History Narrative    FAMILY HISTORY:   Family Status  Relation Status Death Age  . Father Deceased     lung cancer  . Sister Deceased     breast cancer  . Mother Alive     dementia, alzheimer's  . Brother Deceased     MVA  . Sister Deceased     seizures    ROS:  A  complete 10 system review of systems was obtained and was unremarkable apart from what is mentioned above.  PHYSICAL EXAMINATION:    VITALS:   Filed Vitals:   10/05/15 0828  BP: 140/84  Pulse: 70  Height: 5' 9"  (1.753 m)  Weight: 259 lb (117.482 kg)    GEN:  Appears stated age and in NAD. HEENT:  Normocephalic, atraumatic. The mucous membranes are moist. The superficial temporal arteries are without ropiness or tenderness.  There is tenderness over bilateral occipital notches Cardiovascular: Regular rate and rhythm. Lungs: Clear to auscultation bilaterally. Neck: There are no carotid bruits noted bilaterally. Skin:  There is evidence of psoriasis/scaley plaques on the legs/arms  NEUROLOGICAL: Orientation:  Pt is alert and oriented x 3.  Fund of knowledge is appropriate.  Recent and remote memory intact.  Attention and concentration normal.  Able to repeat and name. Cranial nerves: There is mild L facial droop with decreased blink on the L and decreased palpebral fissure on the L. The pupils are equal round and reactive to light bilaterally. Funduscopic exam reveals clear disc margins bilaterally. Extraocular muscles are intact and visual fields are full to confrontational testing. Speech is fluent and clear. Soft palate rises symmetrically and there is no tongue deviation. Hearing is intact to conversational tone. Tone: Tone is good  throughout. Sensation: Sensation is intact to light touch and pinprick throughout (facial, extremities, trunk). Vibration is intact at the bilateral big toe but it is decreased. There is no extinction with double simultaneous stimulation. There is no sensory dermatomal level identified. Coordination:  The patient has no difficulty with RAM's or FNF bilaterally. Motor: Strength is 5/5 in the bilateral upper and lower extremities. Shoulder shrug is equal and symmetric.  There is no pronator drift.  There are no fasciculations noted. DTR's: Deep tendon reflexes  are 1/4 at the bilateral biceps, triceps, brachioradialis, 2/4 at the bilateral patella and achilles.  There is bilateral striatal toe although they go down when the plantar surface is scratched. Gait and Station: The patient is able to ambulate without difficulty. The patient is not able to heel toe walk.  He is not able to ambulate in a tandem fashion. The patient is able to stand in the Romberg position.   LABS:  Lab Results  Component Value Date   WBC 9.6 12/21/2014   HGB 12.8* 12/21/2014   HCT 39.3 12/21/2014   MCV 87.9 12/21/2014   PLT 236 12/21/2014     Chemistry      Component Value Date/Time   NA 139 09/19/2015 1031   NA 138 05/04/2014 0816   K 5.1 09/19/2015 1031   K 4.1 05/04/2014 0816   CL 103 09/19/2015 1031   CL 101 05/04/2014 0816   CO2 29 09/19/2015 1031   CO2 29 05/04/2014 0816   BUN 20 09/19/2015 1031   BUN 24* 05/04/2014 0816   CREATININE 1.54* 09/19/2015 1031   CREATININE 1.78* 05/04/2014 0816      Component Value Date/Time   CALCIUM 8.8 09/19/2015 1031   CALCIUM 9.3 05/04/2014 0816   ALKPHOS 139* 09/19/2015 1031   AST 11 09/19/2015 1031   ALT 10 09/19/2015 1031   BILITOT 0.5 09/19/2015 1031      Lab Results  Component Value Date   HGBA1C 6.2 09/19/2015    No results found for: ANA, RF  No results found for: ANA  No results found for: ESRSEDRATE, POCTSEDRATE   IMPRESSIONS/PLAN:  1.  Headache.  This is most characteristic of bilateral occipital neuralgia.    -will do occipital nerve block.  Talked about r/b/se.  Could raise BS slightly.  Will call me 2 weeks after completed and let me know if need to repeat.  2.  Abnormal brain scan  -I was not convinced that all that his MRI showed evidence of NPH.  First, NPH is a clinical diagnosis and he really does not meet criteria for that.  In addition, his MRI did not show significantly enlarged ventricles.  He had some atrophy, which would account for the slightly dilated ventricles.  3.  Hx of  idiopathic cranial nerve 7 palsy x 4, per hx  -pt states that he never had work up for causes.  Will do labs to make sure that not missing anything including,  lyme titer, rheumatoid factor, HIV, ANA, ESR, ACE, VDRL.  DM can place one at risk for this although his appears to be well controlled, at least right now.

## 2015-10-05 NOTE — Patient Instructions (Signed)
1. Your provider has requested that you have labwork completed today. Please go to [facility] ([id number]) on the second floor of this building before leaving the office today. You do not need to check in. If you are not called within 15 minutes please check with the front desk.   

## 2015-10-06 LAB — ANA: Anti Nuclear Antibody(ANA): POSITIVE — AB

## 2015-10-06 LAB — ANTI-NUCLEAR AB-TITER (ANA TITER): ANA Titer 1: 1:40 {titer} — ABNORMAL HIGH

## 2015-10-06 LAB — RHEUMATOID FACTOR: Rhuematoid fact SerPl-aCnc: 34 IU/mL — ABNORMAL HIGH (ref <=14)

## 2015-10-06 LAB — HIV ANTIBODY (ROUTINE TESTING W REFLEX): HIV 1&2 Ab, 4th Generation: NONREACTIVE

## 2015-10-06 LAB — ANGIOTENSIN CONVERTING ENZYME: Angiotensin-Converting Enzyme: 58 U/L — ABNORMAL HIGH (ref 8–52)

## 2015-10-06 LAB — SEDIMENTATION RATE: Sed Rate: 90 mm/hr — ABNORMAL HIGH (ref 0–20)

## 2015-10-06 LAB — VDRL: VDRL, Serum: NONREACTIVE

## 2015-10-06 LAB — LYME AB/WESTERN BLOT REFLEX: B burgdorferi Ab IgG+IgM: <0.9 Index (ref <0.90)

## 2015-10-07 ENCOUNTER — Telehealth: Payer: Self-pay | Admitting: Neurology

## 2015-10-07 ENCOUNTER — Ambulatory Visit: Payer: Self-pay | Admitting: Neurology

## 2015-10-07 DIAGNOSIS — R899 Unspecified abnormal finding in specimens from other organs, systems and tissues: Secondary | ICD-10-CM

## 2015-10-07 NOTE — Telephone Encounter (Signed)
Patient made aware of results and need for rheumatology consults. He agrees with this plan. Referral faxed to Dr Trudie Reed at [phone] and they will call patient with appt.

## 2015-10-07 NOTE — Telephone Encounter (Signed)
-----   Message from [place], DO sent at 10/07/2015  8:15 AM EDT ----- Spoke with Dr. Gilford Rile about these results and we would like to send him to rheum for an opinion.  Jade, please set him up with Dr. Gavin Pound

## 2015-10-11 ENCOUNTER — Encounter: Payer: Self-pay | Admitting: Neurology

## 2015-10-11 ENCOUNTER — Ambulatory Visit (INDEPENDENT_AMBULATORY_CARE_PROVIDER_SITE_OTHER): Payer: Medicare Other | Admitting: Neurology

## 2015-10-11 VITALS — BP 120/72 | HR 67 | Ht 69.0 in | Wt 254.0 lb

## 2015-10-11 DIAGNOSIS — M5481 Occipital neuralgia: Secondary | ICD-10-CM | POA: Diagnosis not present

## 2015-10-11 MED ORDER — BUPIVACAINE HCL (PF) 0.25 % IJ SOLN
1.5000 mL | Freq: Once | INTRAMUSCULAR | Status: AC
  Administered 2015-10-11: 1.5 mL

## 2015-10-11 MED ORDER — BUPIVACAINE HCL 0.25 % IJ SOLN: 1.5000 mL | Freq: Once | INTRAMUSCULAR | Status: DC

## 2015-10-11 MED ORDER — METHYLPREDNISOLONE ACETATE 40 MG/ML IJ SUSP
40.0000 mg | Freq: Once | INTRAMUSCULAR | Status: AC
  Administered 2015-10-11: 40 mg via INTRAMUSCULAR

## 2015-10-11 NOTE — Procedures (Addendum)
The patient present today for occipital nerve block bilaterally due to occipital neuralgia.  After discussing r/b/se and alternative tx's, the patient decided to proceed with the occipital nerve block.  The area was cleansed with alcohol prior to the procedure.  Approximately 20 mg of depomedrol and 0.5 cc of 0.25% sensorcaine were injected into each occipital notch with a 27 gauge, 1.5 inch needle.  Before injection, the plunger was aspirated without return of blood.  The patient tolerated the procedure well and there were no complications.

## 2015-10-13 ENCOUNTER — Ambulatory Visit (INDEPENDENT_AMBULATORY_CARE_PROVIDER_SITE_OTHER): Payer: Medicare Other | Admitting: Internal Medicine

## 2015-10-13 VITALS — BP 130/71 | HR 58 | Temp 97.6°F | Wt 257.8 lb

## 2015-10-13 DIAGNOSIS — R768 Other specified abnormal immunological findings in serum: Secondary | ICD-10-CM

## 2015-10-13 DIAGNOSIS — R519 Headache, unspecified: Secondary | ICD-10-CM

## 2015-10-13 DIAGNOSIS — R6889 Other general symptoms and signs: Secondary | ICD-10-CM | POA: Diagnosis not present

## 2015-10-13 DIAGNOSIS — R51 Headache: Secondary | ICD-10-CM | POA: Diagnosis not present

## 2015-10-13 DIAGNOSIS — R894 Abnormal immunological findings in specimens from other organs, systems and tissues: Secondary | ICD-10-CM | POA: Diagnosis not present

## 2015-10-13 DIAGNOSIS — E1151 Type 2 diabetes mellitus with diabetic peripheral angiopathy without gangrene: Secondary | ICD-10-CM | POA: Diagnosis not present

## 2015-10-13 LAB — TSH: TSH: 3.76 u[IU]/mL (ref 0.35–4.50)

## 2015-10-13 NOTE — Progress Notes (Signed)
Pre visit review using our clinic review tool, if applicable. No additional management support is needed unless otherwise documented below in the visit note. 

## 2015-10-13 NOTE — Assessment & Plan Note (Signed)
Lab Results  Component Value Date   HGBA1C 6.2 09/19/2015   BG well controlled. Encouraged continued healthy diet and compliance with medication. Will monitor for hypoglycemia.

## 2015-10-13 NOTE — Patient Instructions (Signed)
Follow up here in 11/2015

## 2015-10-13 NOTE — Progress Notes (Signed)
Subjective:    Patient ID: [name] [name], male    DOB: 1955-10-01, 60 y.o.   MRN: [id number]  HPI  60YO male presents for follow up.  Recently seen for headache and DM. Also evaluated by Dr. Carles Collet in Neurology. MRI brain showed slight increase in size of ventricles bilaterally. He underwent occipital nerve block with Dr. Carles Collet. He was also referred to see rheumatology. Scheduled to see them next Friday.  He reports the occipital nerve block helped considerably.  Hep C antibody was positive. He reports this was positive in the past. PCR was negative.  DM - BG have been well controlled. Compliant with medication.  Notes some temp intolerance over last few months. Feels cold all the time.  Wt Readings from Last 3 Encounters:  10/13/15 257 lb 13 oz (116.943 kg)  10/11/15 254 lb (115.214 kg)  10/05/15 259 lb (117.482 kg)   BP Readings from Last 3 Encounters:  10/13/15 130/71  10/11/15 120/72  10/05/15 140/84    Past Medical History  Diagnosis Date  . Peripheral vascular disease ([place])   . Diabetes mellitus   . Mild aortic stenosis   . Tobacco use disorder     recently quit  . Depression   . Psoriasis   . Neuropathy ([place])   . Obesity   . Post splenectomy syndrome   . SOB (shortness of breath)   . Carotid artery occlusion   . Coronary artery disease   . Hyperlipidemia   . Hypertension   . Bell's palsy 2007  . Sleep apnea   . Angina at rest [facility])     chronic  . Cataract     Dr. Dawna Part  . Anxiety   . Arthritis   . DVT (deep venous thrombosis) (HCC)     in leg  . Stroke ([place])   . Allergy   . Heart murmur   . Tubular adenoma 08/2013    Dr. Hilarie Fredrickson  . Adenomatous colon polyp   . Gastropathy   . Duodenitis   . Helicobacter pylori gastritis   . Diverticulosis   . Weak urinary stream    Family History  Problem Relation Age of Onset  . Lung cancer Father 41  . Breast cancer Sister 46  . Diabetes Paternal Grandfather   . Colon polyps Paternal Grandfather 51  .  Dementia Mother   . Alzheimer's disease Mother    Past Surgical History  Procedure Laterality Date  . Splenectomy    . Heart stents  Jan 2011    leg stents 06/2009 and 03/2010  . Coronary artery bypass graft  09/06/2010    At [facility]: LIMA to LAD, left radial to RCA, sequential SVG to OM3 and 4  . Carotid endarterectomy  08/2010    left/ Dr. Kellie Simmering  . Pta of illiac and sfa  multiple    Dr. Ronalee Belts, s/p revision 11/2012  . Colonoscopy    . Cardiac catheterization  08/2010    LAD: 80% ISR, RCA: 80% ostial, OM 80-90%  . Coronary angioplasty      LAD: before CABG   Social History   Social History  . Marital Status: Married    Spouse Name: N/A  . Number of Children: 0  . Years of Education: N/A   Occupational History  .     Social History Main Topics  . Smoking status: Current Some Day Smoker -- 40 years    Types: Cigarettes  . Smokeless tobacco: Never Used  Comment: smokes 2-3 cigarettes every 3 weeks  . Alcohol Use: No  . Drug Use: No  . Sexual Activity: No   Other Topics Concern  . Not on file   Social History Narrative    Review of Systems  Constitutional: Negative for fever, chills, activity change, appetite change, fatigue and unexpected weight change.  Eyes: Negative for visual disturbance.  Respiratory: Negative for cough and shortness of breath.   Cardiovascular: Negative for chest pain, palpitations and leg swelling.  Gastrointestinal: Negative for nausea, vomiting, abdominal pain, diarrhea, constipation and abdominal distention.  Endocrine: Positive for cold intolerance. Negative for heat intolerance.  Genitourinary: Negative for dysuria, urgency and difficulty urinating.  Musculoskeletal: Negative for arthralgias and gait problem.  Skin: Negative for color change and rash.  Neurological: Positive for headaches. Negative for dizziness, tremors, syncope, weakness and light-headedness.  Hematological: Negative for adenopathy.  Psychiatric/Behavioral:  Negative for sleep disturbance and dysphoric mood. The patient is not nervous/anxious.        Objective:    BP 130/71 mmHg  Pulse 58  Temp(Src) 97.6 F (36.4 C) (Oral)  Wt 257 lb 13 oz (116.943 kg)  SpO2 100% Physical Exam  Constitutional: He is oriented to person, place, and time. He appears well-developed and well-nourished. No distress.  HENT:  Head: Normocephalic and atraumatic.  Right Ear: External ear normal.  Left Ear: External ear normal.  Nose: Nose normal.  Mouth/Throat: Oropharynx is clear and moist. No oropharyngeal exudate.  Eyes: Conjunctivae and EOM are normal. Pupils are equal, round, and reactive to light. Right eye exhibits no discharge. Left eye exhibits no discharge. No scleral icterus.  Neck: Normal range of motion. Neck supple. No tracheal deviation present. No thyromegaly present.  Cardiovascular: Normal rate, regular rhythm and normal heart sounds.  Exam reveals no gallop and no friction rub.   No murmur heard. Pulmonary/Chest: Effort normal and breath sounds normal. No accessory muscle usage. No tachypnea. No respiratory distress. He has no decreased breath sounds. He has no wheezes. He has no rhonchi. He has no rales. He exhibits no tenderness.  Musculoskeletal: Normal range of motion. He exhibits no edema.  Lymphadenopathy:    He has no cervical adenopathy.  Neurological: He is alert and oriented to person, place, and time. No cranial nerve deficit. Coordination normal.  Skin: Skin is warm and dry. No rash noted. He is not diaphoretic. No erythema. No pallor.  Psychiatric: He has a normal mood and affect. His behavior is normal. Judgment and thought content normal.          Assessment & Plan:   Problem List Items Addressed This Visit      Unprioritized   ANA positive    Referral in process for rheumatology evaluation given recent ANA pos and RF pos. Will follow up after evaluation completed.      DM (diabetes mellitus), type 2 with peripheral  vascular complications (HCC)    Lab Results  Component Value Date   HGBA1C 6.2 09/19/2015   BG well controlled. Encouraged continued healthy diet and compliance with medication. Will monitor for hypoglycemia.      Headache - Primary    Headache symptoms improved after occipital nerve block with Dr. Carles Collet. Will continue to monitor.      Hepatitis C antibody test positive    Hep C antibody pos but Hep C PCR negative. Will plan repeat testing with next labs.      Temperature intolerance    Recent temp intolerance. Will check TSH  with labs.      Relevant Orders   TSH       Return in about 2 months (around 12/13/2015) for Recheck.  Ronette Deter, MD Internal Medicine [facility]

## 2015-10-13 NOTE — Assessment & Plan Note (Signed)
Recent temp intolerance. Will check TSH with labs.

## 2015-10-13 NOTE — Assessment & Plan Note (Signed)
Hep C antibody pos but Hep C PCR negative. Will plan repeat testing with next labs.

## 2015-10-13 NOTE — Assessment & Plan Note (Signed)
Referral in process for rheumatology evaluation given recent ANA pos and RF pos. Will follow up after evaluation completed.

## 2015-10-13 NOTE — Assessment & Plan Note (Signed)
Headache symptoms improved after occipital nerve block with Dr. Carles Collet. Will continue to monitor.

## 2015-10-18 ENCOUNTER — Telehealth: Payer: Self-pay | Admitting: Neurology

## 2015-10-18 NOTE — Telephone Encounter (Signed)
Left message on machine for patient to call back.

## 2015-10-18 NOTE — Telephone Encounter (Signed)
Happy to try one more injection but if that doesn't work, we won't try more.

## 2015-10-18 NOTE — Telephone Encounter (Signed)
Patient called back - he states headache never fully went away but it got better for awhile. Started getting worse again on Friday and really bad today. He states it feels like someone is hitting his head with a hammer. Please advise.

## 2015-10-18 NOTE — Telephone Encounter (Signed)
Pt states that his headaches are coming back please call [phone]

## 2015-10-19 ENCOUNTER — Ambulatory Visit (INDEPENDENT_AMBULATORY_CARE_PROVIDER_SITE_OTHER): Payer: Medicare Other | Admitting: Psychology

## 2015-10-19 DIAGNOSIS — F449 Dissociative and conversion disorder, unspecified: Secondary | ICD-10-CM

## 2015-10-19 DIAGNOSIS — F332 Major depressive disorder, recurrent severe without psychotic features: Secondary | ICD-10-CM

## 2015-10-19 NOTE — Telephone Encounter (Signed)
Appt made with patient for repeat injection.

## 2015-10-21 DIAGNOSIS — M2550 Pain in unspecified joint: Secondary | ICD-10-CM | POA: Diagnosis not present

## 2015-10-21 DIAGNOSIS — M79642 Pain in left hand: Secondary | ICD-10-CM | POA: Diagnosis not present

## 2015-10-21 DIAGNOSIS — R768 Other specified abnormal immunological findings in serum: Secondary | ICD-10-CM | POA: Diagnosis not present

## 2015-10-21 DIAGNOSIS — M150 Primary generalized (osteo)arthritis: Secondary | ICD-10-CM | POA: Diagnosis not present

## 2015-10-21 DIAGNOSIS — R5383 Other fatigue: Secondary | ICD-10-CM | POA: Diagnosis not present

## 2015-10-21 DIAGNOSIS — M5481 Occipital neuralgia: Secondary | ICD-10-CM | POA: Diagnosis not present

## 2015-10-21 DIAGNOSIS — M79641 Pain in right hand: Secondary | ICD-10-CM | POA: Diagnosis not present

## 2015-10-21 DIAGNOSIS — M542 Cervicalgia: Secondary | ICD-10-CM | POA: Diagnosis not present

## 2015-10-21 DIAGNOSIS — L409 Psoriasis, unspecified: Secondary | ICD-10-CM | POA: Diagnosis not present

## 2015-10-24 ENCOUNTER — Ambulatory Visit (INDEPENDENT_AMBULATORY_CARE_PROVIDER_SITE_OTHER): Payer: Medicare Other | Admitting: Neurology

## 2015-10-24 ENCOUNTER — Encounter: Payer: Self-pay | Admitting: Neurology

## 2015-10-24 VITALS — BP 122/68 | HR 75 | Ht 68.0 in | Wt 255.0 lb

## 2015-10-24 DIAGNOSIS — M5481 Occipital neuralgia: Secondary | ICD-10-CM

## 2015-10-24 MED ORDER — BUPIVACAINE HCL (PF) 0.25 % IJ SOLN
3.0000 mL | Freq: Once | INTRAMUSCULAR | Status: AC
  Administered 2015-10-24: 3 mL

## 2015-10-24 MED ORDER — METHYLPREDNISOLONE ACETATE 40 MG/ML IJ SUSP
80.0000 mg | Freq: Once | INTRAMUSCULAR | Status: AC
  Administered 2015-10-24: 80 mg via INTRAMUSCULAR

## 2015-10-24 NOTE — Procedures (Signed)
The patient present today for occipital nerve block bilaterally due to occipital neuralgia.  He did well with previous injection and reported that headache virtually completely resolved for about a week and then started to come back. He feels that the headache is back "full force" now.   After discussing r/b/se and alternative tx's, the patient decided to proceed with the occipital nerve block.  The area was cleansed with alcohol prior to the procedure.  Approximately 40 mg of depomedrol and 1 cc of 0.25% sensorcaine were injected into each occipital notch with a 27 gauge, 1.5 inch needle.  Before injection, the plunger was aspirated without return of blood.  The patient tolerated the procedure well and there were no complications.

## 2015-10-25 ENCOUNTER — Encounter: Payer: Self-pay | Admitting: Psychiatry

## 2015-10-25 ENCOUNTER — Ambulatory Visit (INDEPENDENT_AMBULATORY_CARE_PROVIDER_SITE_OTHER): Payer: Medicare Other | Admitting: Psychiatry

## 2015-10-25 VITALS — BP 122/78 | HR 71 | Temp 97.1°F | Ht 68.0 in | Wt 256.0 lb

## 2015-10-25 DIAGNOSIS — F331 Major depressive disorder, recurrent, moderate: Secondary | ICD-10-CM | POA: Diagnosis not present

## 2015-10-25 DIAGNOSIS — F411 Generalized anxiety disorder: Secondary | ICD-10-CM

## 2015-10-25 NOTE — Progress Notes (Signed)
Patient ID: [name] [name], male   DOB: June 05, 1956, 60 y.o.   MRN: [id number] [facility] MD/PA/NP OP Progress Note  10/25/2015 9:22 AM [name] [name]  MRN:  [id number]  Subjective:  Patient returns for follow-up was generalized anxiety disorder and depression. Patient was previously seen by Dr.williams, this is the first visit for this patient with this clinician. Patient reports that overall he is doing okay. States that he does have some anxiety time to time and happens about twice a week. He attributes his anxiety to his headaches for which she gets shots. He also reports that at home he they have his wife's adult son and his family living with them. States that it can get aggravating when they interfere in his routine and activities. States that he is sleeping okay and he is diabetic, . his diet is the around that. Denies any suicidal thoughts. He enjoys being out in the yard. Enjoys going to church on Sundays.  Chief Complaint: good Chief Complaint    Follow-up; Medication Refill; Other     Visit Diagnosis:   No diagnosis found.  Past Medical History:  Past Medical History  Diagnosis Date  . Peripheral vascular disease ([place])   . Diabetes mellitus   . Mild aortic stenosis   . Tobacco use disorder     recently quit  . Depression   . Psoriasis   . Neuropathy ([place])   . Obesity   . Post splenectomy syndrome   . SOB (shortness of breath)   . Carotid artery occlusion   . Coronary artery disease   . Hyperlipidemia   . Hypertension   . Bell's palsy 2007  . Sleep apnea   . Angina at rest [facility])     chronic  . Cataract     Dr. Dawna Part  . Anxiety   . Arthritis   . DVT (deep venous thrombosis) (HCC)     in leg  . Stroke ([place])   . Allergy   . Heart murmur   . Tubular adenoma 08/2013    Dr. Hilarie Fredrickson  . Adenomatous colon polyp   . Gastropathy   . Duodenitis   . Helicobacter pylori gastritis   . Diverticulosis   . Weak urinary stream     Past Surgical History  Procedure Laterality Date   . Splenectomy    . Heart stents  Jan 2011    leg stents 06/2009 and 03/2010  . Coronary artery bypass graft  09/06/2010    At [facility]: LIMA to LAD, left radial to RCA, sequential SVG to OM3 and 4  . Carotid endarterectomy  08/2010    left/ Dr. Kellie Simmering  . Pta of illiac and sfa  multiple    Dr. Ronalee Belts, s/p revision 11/2012  . Colonoscopy    . Cardiac catheterization  08/2010    LAD: 80% ISR, RCA: 80% ostial, OM 80-90%  . Coronary angioplasty      LAD: before CABG   Family History:  Family History  Problem Relation Age of Onset  . Lung cancer Father 36  . Breast cancer Sister 22  . Diabetes Paternal Grandfather   . Colon polyps Paternal Grandfather 46  . Dementia Mother   . Alzheimer's disease Mother    Social History:  Social History   Social History  . Marital Status: Married    Spouse Name: N/A  . Number of Children: 0  . Years of Education: N/A   Occupational History  .     Social History Main  Topics  . Smoking status: Current Some Day Smoker -- 40 years    Types: Cigarettes  . Smokeless tobacco: Never Used     Comment: smokes 2-3 cigarettes every 3 weeks  . Alcohol Use: No  . Drug Use: No  . Sexual Activity: No   Other Topics Concern  . Not on file   Social History Narrative   Additional History:   Assessment:   Musculoskeletal: Strength & Muscle Tone: within normal limits Gait & Station: unsteady walks with cane Patient leans: N/A  Psychiatric Specialty Exam: Anxiety Patient reports no insomnia, nervous/anxious behavior or suicidal ideas.    Depression        Associated symptoms include does not have insomnia and no suicidal ideas.  Past medical history includes anxiety.     Review of Systems  Psychiatric/Behavioral: Negative.  Negative for depression, suicidal ideas, hallucinations, memory loss and substance abuse. The patient is not nervous/anxious and does not have insomnia.   All other systems reviewed and are negative.   There were no  vitals taken for this visit.There is no weight on file to calculate BMI.  General Appearance: Well Groomed  Eye Contact:  Good  Speech:  Normal Rate  Volume:  Normal  Mood:  Good  Affect:  Congruent  Thought Process:  Circumstantial at baseline   Orientation:  Full (Time, Place, and Person)  Thought Content:  Negative  Suicidal Thoughts:  No  Homicidal Thoughts:  No  Memory:  Immediate;   Good Recent;   Good Remote;   Good  Judgement:  Good  Insight:  Good  Psychomotor Activity:  Negative  Concentration:  Good  Recall:  Good  Fund of Knowledge: Good  Language: Good  Akathisia:  Negative  Handed:  Right  AIMS (if indicated):  N/A  Assets:  Communication Skills Desire for Improvement Social Support  ADL's:  Intact  Cognition: WNL  Sleep:  Good    Is the patient at risk to self?  No. Has the patient been a risk to self in the past 6 months?  No. Has the patient been a risk to self within the distant past?  No. Is the patient a risk to others?  No. Has the patient been a risk to others in the past 6 months?  No. Has the patient been a risk to others within the distant past?  No.  Current Medications: Current Outpatient Prescriptions  Medication Sig Dispense Refill  . albuterol (PROAIR HFA) 108 (90 Base) MCG/ACT inhaler Inhale into the lungs.    [name] aspirin 81 MG tablet Take 81 mg by mouth daily.    [name] BAYER MICROLET LANCETS lancets use as directed three times a day Dx: E11.9 100 each 12  . buPROPion (WELLBUTRIN XL) 150 MG 24 hr tablet Take 1 tablet (150 mg total) by mouth daily. 30 tablet 4  . busPIRone (BUSPAR) 10 MG tablet Take 2 tablets (20 mg total) by mouth 2 (two) times daily. 120 tablet 4  . cholecalciferol (VITAMIN D) 1000 UNITS tablet Take 1,000 Units by mouth daily.      . Cinnamon 500 MG capsule Take 1,000 mg by mouth daily.    . clopidogrel (PLAVIX) 75 MG tablet take 1 tablet by mouth once daily 90 tablet 3  . cyclobenzaprine (FLEXERIL) 10 MG tablet Take 1  tablet (10 mg total) by mouth 3 (three) times daily as needed for muscle spasms. 30 tablet 2  . finasteride (PROSCAR) 5 MG tablet Take 1 tablet (5  mg total) by mouth daily. 30 tablet 11  . Glucosamine-Chondroit-Vit C-Mn (GLUCOSAMINE CHONDR 500 COMPLEX) CAPS Take 1,000 each by mouth daily.    [name] glucose blood (BAYER CONTOUR TEST) test strip Use to test blood sugar three times daily as instructed.  Dx code: E11.9 100 each 5  . insulin NPH Human (NOVOLIN N RELION) 100 UNIT/ML injection Inject 40 units in am and 20 units in the evening 40 mL 2  . Insulin Pen Needle (NOVOFINE) 32G X 6 MM MISC Use three times daily 100 each 5  . insulin regular (NOVOLIN R,HUMULIN R) 100 units/mL injection Inject 0.1-0.3 mLs (10-30 Units total) into the skin 3 (three) times daily before meals. 40 mL 5  . Insulin Syringe-Needle U-100 (RELION INSULIN SYRINGE 1ML/31G) 31G X 5/16" 1 ML MISC USE THREE TIMES A DAY. 290 each 1  . ketoconazole (NIZORAL) 2 % cream apply to affected area at bedtime  0  . Lancet Device MISC 1 application by Does not apply route as needed. 100 each 11  . losartan (COZAAR) 25 MG tablet take 1 tablet by mouth once daily 90 tablet 3  . magnesium gluconate (MAGONATE) 500 MG tablet Take 1,000 mg by mouth daily.    . metoprolol succinate (TOPROL-XL) 50 MG 24 hr tablet take 1 tablet by mouth twice a day 60 tablet 11  . Multiple Vitamin (MULTIVITAMIN) capsule Take 1 capsule by mouth daily.      . naloxegol oxalate (MOVANTIK) 25 MG TABS tablet Take 1 tablet (25 mg total) by mouth daily. 1 hour before a meal 30 tablet 2  . nitazoxanide (ALINIA) 500 MG tablet Take 1 tablet (500 mg total) by mouth 2 (two) times daily with a meal. 20 tablet 0  . nitroGLYCERIN (NITROSTAT) 0.4 MG SL tablet Place 1 tablet (0.4 mg total) under the tongue every 5 (five) minutes as needed. 25 tablet 1  . Omega-3 Fatty Acids (FISH OIL PO) Take by mouth.    [name] omeprazole (PRILOSEC) 20 MG capsule Take 1 capsule (20 mg total) by mouth 2  (two) times daily. 20 capsule 0  . oxyCODONE-acetaminophen (ROXICET) 5-325 MG tablet Take 1 tablet by mouth every 8 (eight) hours as needed for severe pain. 60 tablet 0  . polyethylene glycol (MIRALAX) packet Take 17 g by mouth daily. 14 each 3  . sertraline (ZOLOFT) 100 MG tablet Take 1.5 tablets (150 mg total) by mouth daily. 45 tablet 4  . simvastatin (ZOCOR) 20 MG tablet   0  . tamsulosin (FLOMAX) 0.4 MG CAPS capsule take 2 capsule by mouth daily 60 capsule 11  . triamcinolone ointment (KENALOG) 0.1 % apply to affected area twice a day AVOID FACE, GROIN AND UNDERARMS  0   No current facility-administered medications for this visit.    Medical Decision Making:  Established Problem, Stable/Improving (1) and Review of New Medication or Change in Dosage (2)  Treatment Plan Summary:Medication management and Plan   Depressive disorder, moderate, recurrent-stable We will continue the sertraline at 150 mg daily and  Wellbutrin XL 150 mg in the morning. Continue to see therapist, Dr. Cristy Folks.  Generalized anxiety disorder-stable We'll continue his BuSpar to 20 mg twice daily.    Patient will follow up in 2-3  months.   [name] [name] 10/25/2015, 9:22 AM

## 2015-10-26 ENCOUNTER — Ambulatory Visit: Payer: Medicare Other | Admitting: Psychology

## 2015-10-27 ENCOUNTER — Encounter: Payer: Self-pay | Admitting: Internal Medicine

## 2015-10-27 ENCOUNTER — Ambulatory Visit (INDEPENDENT_AMBULATORY_CARE_PROVIDER_SITE_OTHER): Payer: Medicare Other | Admitting: Internal Medicine

## 2015-10-27 DIAGNOSIS — E1151 Type 2 diabetes mellitus with diabetic peripheral angiopathy without gangrene: Secondary | ICD-10-CM | POA: Diagnosis not present

## 2015-10-27 NOTE — Patient Instructions (Signed)
Please continue:  Insulin Before breakfast Before lunch Before dinner  Regular - clear (short acting) 20 units - smaller meal 25 units - regular meal 30 units - larger meal  10 units - smaller meal 15 units - regular meal 20 units - larger meal  NPH - cloudy (long acting) 40  20   Please come back for a follow-up appointment in 3 months. 

## 2015-10-27 NOTE — Progress Notes (Signed)
Patient ID: [name] [name], male   DOB: 10-Jun-1956, 60 y.o.   MRN: [id number]  HPI: [name] [name] is a 60 y.o.-year-old male, returning for f/u for DM2, dx 2006, insulin-dependent since 2011, uncontrolled, with complications (CAD - CABG 2012, PVD - s/p Stents, Aortic and carotid stenosis, peripheral neuropathy). Last visit 3 mo ago.  He has more severe HAs since last visit. Seeing neurology.  Last hemoglobin A1c: Lab Results  Component Value Date   HGBA1C 6.2 09/19/2015   HGBA1C 6.6* 06/14/2015   HGBA1C 6.4 03/14/2015   We stopped Victoza at a previous visit. He could not afford his insulins (doughnut hole) - 400$.   He is now on: N and R insulins - decreased 04/2015 b/c low CBGs:  Insulin Before breakfast Before lunch Before dinner  Regular - clear (short acting) 20 units - smaller meal 25 units - regular meal 30 units - larger meal  10 units - smaller meal 15 units - regular meal 20 units - larger meal  NPH - cloudy (long acting) 40  20   Pt checks his sugars 3x a day and they are (reviewed log): - am: 95-180, 190 >> 76, 98-163, 180, 190 >> 132-144 >> 88-166, 196, 309 >> 89-154, 193 >> 69, 82-143 >> 78, 86-145 - 2h after b'fast: 198, 239 >> n/c >> 363, 377 >> 148 >> 139-148 >> n/c >> 161 >> n/c >> 138 >> 219 - before lunch: 106-207 >> 134-187, 192, 207 >> 108 >> 70, 79, 123-167, 220 >> 133, 179 >> 119-158 >> n/c - 2h after lunch: 94, 115-189 >> 130-160, 215 >> 70, 101-170, 186 >> 93-179, 217 >> 101-165, 187 >> 94-170, 196 - before dinner: 120-150 >> 152-188 >> 95-150 >> 87, 95-172, 188 >> n/c >> 101-192 >> 122-147 >> 69-137 >> 175 - 2h after dinner:101-136 lately , 175 >> 89, 139-188 >> 83-135 >> 39 x1; 100-143 >> 98-143, 197, 210 >> 115-189, 242 - bedtime: 127-135 >> 101-276 >> 225 >> n/c >> 106-129 >> 80-135 >> 101-154 >> 79-134 >> 89-134 >> 109-139 No lows. Lowest sugar was 69 >> 39 >> 69 >> 78; ? hypoglycemia awareness. Highest sugar was 377 >> 272 >> 224 >> 254 - few  200s >> 217 >> 210x1 >> 242  Pt's meals are: - Breakfast: toast + eggs or cereals + milk - Lunch: soup + 1/2 sandwich - Dinner: meat + sweet potatoes or pasta + salad - Snacks: 3: sugar free foods, fruit He quit drinking sodas, but drinks tea - artificial sweetener. He also started to drink Almond milk. Exercises by walking 2x a day.  - He has CKD, last BUN/creatinine:  Lab Results  Component Value Date   BUN 20 09/19/2015   CREATININE 1.54* 09/19/2015  He is on Cozaar. Sees nephrology. Has a stone in L kidney, has 3 cysts in R kidney. - last set of lipids: Lab Results  Component Value Date   CHOL 127 09/19/2015   HDL 30.50* 09/19/2015   LDLCALC 72 09/19/2015   LDLDIRECT 64.9 06/01/2014   TRIG 122.0 09/19/2015   CHOLHDL 4 09/19/2015  He  Is on Zocor. - last eye exam was in 04/21/2015 [facility] DR. + cataracts. - + numbness and tingling in his feet - [facility]) - Dr Elvina Mattes.   He has pain in L leg >> surgery for PVD stents on 04/27/2014. His sugars improved after the stent.   I reviewed pt's medications, allergies, PMH, social hx, family  hx, and changes were documented in the history of present illness. Otherwise, unchanged from my initial visit note.   ROS: Constitutional:  weight loss, no fatigue, + subjective hyperthermia  Eyes: no blurry vision, no xerophthalmia ENT: no sore throat, no nodules palpated in throat, + dysphagia/no odynophagia, no hoarseness Cardiovascular: no CP/SOB/+ palpitations/+ leg swelling Respiratory: + cough/no SOB Gastrointestinal: no N/V/+ D/no C Musculoskeletal: + muscle/+ joint aches Skin: + rash - face Neurological: no tremors/numbness/tingling/dizziness, + HA + diff with erections  PE: BP 132/78 mmHg  Pulse 71  Temp(Src) 98.1 F (36.7 C) (Oral)  Resp 12  Wt 254 lb 12.8 oz (115.577 kg)  SpO2 97% Body mass index is 38.75 kg/(m^2). Wt Readings from Last 3 Encounters:  10/27/15 254 lb 12.8 oz (115.577 kg)   10/25/15 256 lb (116.121 kg)  10/24/15 255 lb (115.667 kg)   Constitutional: obese, in NAD, ruddy complexion Eyes: PERRLA, EOMI, no exophthalmos ENT: dry mucous membranes, no thyromegaly, no cervical lymphadenopathy Cardiovascular: RRR, +3/6 SEM, no RG Respiratory: CTA B Gastrointestinal: abdomen soft, NT, ND, BS+ Musculoskeletal: no deformities, strength intact in all 4 Skin: moist, warm, + rash - rosacea on face  Neurological: no tremor with outstretched hands, DTR normal in all 4  ASSESSMENT: 1. DM2, insulin-dependent, uncontrolled, with complications - CAD - s/p CABG 2012 - PVD - s/p stents 2012 - Aortic stenosis - carotid stenosis - CKD - peripheral neuropathy   PLAN:  1. Patient with long-standing, uncontrolled diabetes, on injectable regimen. Diabetes has improved since last visit>> no more lows and only few highs (we reduced the R insulin at last visit).  - will not change regimen - I advised him to Patient Instructions   Please continue:  Insulin Before breakfast Before lunch Before dinner  Regular - clear (short acting) 20 units - smaller meal 25 units - regular meal 30 units - larger meal  10 units - smaller meal 15 units - regular meal 20 units - larger meal  NPH - cloudy (long acting) 40  20   Please come back for a follow-up appointment in 3 months.  - continue to check 3 times a day, rotating checks - He read Dr Janene Harvey book: Program for Reversing Diabetes". - up to date with eye exams - reviewed HbA1c obtained by PCP in 08/2015: 6.2% (excellent!) - Return to clinic in 3 mo with sugar log

## 2015-10-31 ENCOUNTER — Telehealth: Payer: Self-pay | Admitting: Neurology

## 2015-10-31 ENCOUNTER — Other Ambulatory Visit: Payer: Self-pay

## 2015-10-31 MED ORDER — GLUCOSE BLOOD VI STRP: ORAL_STRIP | Status: DC

## 2015-10-31 NOTE — Telephone Encounter (Signed)
We can try topamax.  Tell him r/b/se of med.  Drink plenty of water.  Tell him can make carbonated bev taste bad and may cause tingling of hands/feet intermittently.  May take little bit time to work.  Make f/u in 3 months.  Let me know if issues prior to that.

## 2015-10-31 NOTE — Telephone Encounter (Signed)
Kayshaun [facility] 07/26/1955 called and said that he still has a headache even after the shots he had gotten last week. His contact number is [phone]. Thank you

## 2015-10-31 NOTE — Telephone Encounter (Signed)
Patient had second occipital nerve block last week. Please advise next steps.

## 2015-11-01 MED ORDER — TOPIRAMATE 100 MG PO TABS: 100.0000 mg | ORAL_TABLET | Freq: Every day | ORAL | Status: DC

## 2015-11-01 MED ORDER — TOPIRAMATE 25 MG PO TABS: ORAL_TABLET | ORAL | Status: DC

## 2015-11-01 NOTE — Telephone Encounter (Signed)
Patient made aware. RX sent to pharmacy. Follow up appt scheduled.

## 2015-11-02 ENCOUNTER — Telehealth: Payer: Self-pay | Admitting: Internal Medicine

## 2015-11-02 ENCOUNTER — Ambulatory Visit: Payer: Medicare Other | Admitting: Psychology

## 2015-11-02 NOTE — Telephone Encounter (Signed)
[name] [name] [id number] called from [facility] pharmacy regarding [place] form that needs to be filled out which  allows pharmacy to bill Medicare for test strips. It was fax every day since 10/24/2015. Fax number [phone]. Thank you!

## 2015-11-03 NOTE — Telephone Encounter (Signed)
Form was completed my julie yesterday.  Thanks

## 2015-11-07 DIAGNOSIS — E1142 Type 2 diabetes mellitus with diabetic polyneuropathy: Secondary | ICD-10-CM | POA: Diagnosis not present

## 2015-11-07 DIAGNOSIS — B351 Tinea unguium: Secondary | ICD-10-CM | POA: Diagnosis not present

## 2015-11-09 ENCOUNTER — Ambulatory Visit (INDEPENDENT_AMBULATORY_CARE_PROVIDER_SITE_OTHER): Payer: Medicare Other | Admitting: Psychology

## 2015-11-09 DIAGNOSIS — F4323 Adjustment disorder with mixed anxiety and depressed mood: Secondary | ICD-10-CM

## 2015-11-16 ENCOUNTER — Ambulatory Visit (INDEPENDENT_AMBULATORY_CARE_PROVIDER_SITE_OTHER): Payer: Medicare Other | Admitting: Psychology

## 2015-11-16 DIAGNOSIS — B1920 Unspecified viral hepatitis C without hepatic coma: Secondary | ICD-10-CM | POA: Diagnosis not present

## 2015-11-16 DIAGNOSIS — L4050 Arthropathic psoriasis, unspecified: Secondary | ICD-10-CM | POA: Diagnosis not present

## 2015-11-16 DIAGNOSIS — M5481 Occipital neuralgia: Secondary | ICD-10-CM | POA: Diagnosis not present

## 2015-11-16 DIAGNOSIS — F449 Dissociative and conversion disorder, unspecified: Secondary | ICD-10-CM

## 2015-11-16 DIAGNOSIS — F332 Major depressive disorder, recurrent severe without psychotic features: Secondary | ICD-10-CM

## 2015-11-16 DIAGNOSIS — L409 Psoriasis, unspecified: Secondary | ICD-10-CM | POA: Diagnosis not present

## 2015-11-16 DIAGNOSIS — M150 Primary generalized (osteo)arthritis: Secondary | ICD-10-CM | POA: Diagnosis not present

## 2015-11-16 DIAGNOSIS — M2550 Pain in unspecified joint: Secondary | ICD-10-CM | POA: Diagnosis not present

## 2015-11-16 DIAGNOSIS — R768 Other specified abnormal immunological findings in serum: Secondary | ICD-10-CM | POA: Diagnosis not present

## 2015-11-17 ENCOUNTER — Telehealth: Payer: Self-pay | Admitting: Neurology

## 2015-11-17 NOTE — Telephone Encounter (Signed)
PT called and said the headache medicine Dr Tat prescribed is not working/Dawn CB# [phone]

## 2015-11-17 NOTE — Telephone Encounter (Signed)
Spoke with patient and made him aware Topamax takes time to start working- he has only been on medication two weeks. He will give it more time and let us know.

## 2015-11-18 DIAGNOSIS — R809 Proteinuria, unspecified: Secondary | ICD-10-CM | POA: Diagnosis not present

## 2015-11-18 DIAGNOSIS — N183 Chronic kidney disease, stage 3 (moderate): Secondary | ICD-10-CM | POA: Diagnosis not present

## 2015-11-18 DIAGNOSIS — N2581 Secondary hyperparathyroidism of renal origin: Secondary | ICD-10-CM | POA: Diagnosis not present

## 2015-11-18 DIAGNOSIS — E1122 Type 2 diabetes mellitus with diabetic chronic kidney disease: Secondary | ICD-10-CM | POA: Diagnosis not present

## 2015-11-18 DIAGNOSIS — I129 Hypertensive chronic kidney disease with stage 1 through stage 4 chronic kidney disease, or unspecified chronic kidney disease: Secondary | ICD-10-CM | POA: Diagnosis not present

## 2015-11-23 ENCOUNTER — Ambulatory Visit (INDEPENDENT_AMBULATORY_CARE_PROVIDER_SITE_OTHER): Payer: Medicare Other | Admitting: Psychology

## 2015-11-23 DIAGNOSIS — F449 Dissociative and conversion disorder, unspecified: Secondary | ICD-10-CM

## 2015-11-23 DIAGNOSIS — F332 Major depressive disorder, recurrent severe without psychotic features: Secondary | ICD-10-CM

## 2015-11-24 DIAGNOSIS — H348321 Tributary (branch) retinal vein occlusion, left eye, with retinal neovascularization: Secondary | ICD-10-CM | POA: Diagnosis not present

## 2015-11-24 DIAGNOSIS — H2513 Age-related nuclear cataract, bilateral: Secondary | ICD-10-CM | POA: Diagnosis not present

## 2015-11-24 LAB — HM DIABETES EYE EXAM

## 2015-11-28 ENCOUNTER — Encounter: Payer: Self-pay | Admitting: *Deleted

## 2015-11-28 DIAGNOSIS — L4050 Arthropathic psoriasis, unspecified: Secondary | ICD-10-CM | POA: Diagnosis not present

## 2015-11-30 ENCOUNTER — Ambulatory Visit (INDEPENDENT_AMBULATORY_CARE_PROVIDER_SITE_OTHER): Payer: Medicare Other | Admitting: Psychology

## 2015-11-30 DIAGNOSIS — F332 Major depressive disorder, recurrent severe without psychotic features: Secondary | ICD-10-CM

## 2015-11-30 DIAGNOSIS — F449 Dissociative and conversion disorder, unspecified: Secondary | ICD-10-CM

## 2015-12-04 ENCOUNTER — Other Ambulatory Visit: Payer: Self-pay | Admitting: Internal Medicine

## 2015-12-07 ENCOUNTER — Ambulatory Visit: Payer: Medicare Other | Admitting: Psychology

## 2015-12-12 DIAGNOSIS — L4050 Arthropathic psoriasis, unspecified: Secondary | ICD-10-CM | POA: Diagnosis not present

## 2015-12-13 ENCOUNTER — Ambulatory Visit (INDEPENDENT_AMBULATORY_CARE_PROVIDER_SITE_OTHER): Payer: Medicare Other | Admitting: Internal Medicine

## 2015-12-13 ENCOUNTER — Encounter: Payer: Self-pay | Admitting: Internal Medicine

## 2015-12-13 VITALS — BP 132/74 | HR 65 | Ht 68.0 in | Wt 261.0 lb

## 2015-12-13 DIAGNOSIS — L409 Psoriasis, unspecified: Secondary | ICD-10-CM

## 2015-12-13 DIAGNOSIS — I10 Essential (primary) hypertension: Secondary | ICD-10-CM | POA: Diagnosis not present

## 2015-12-13 DIAGNOSIS — E1151 Type 2 diabetes mellitus with diabetic peripheral angiopathy without gangrene: Secondary | ICD-10-CM | POA: Diagnosis not present

## 2015-12-13 LAB — COMPREHENSIVE METABOLIC PANEL
ALT: 13 U/L (ref 0–53)
AST: 13 U/L (ref 0–37)
Albumin: 3.9 g/dL (ref 3.5–5.2)
Alkaline Phosphatase: 89 U/L (ref 39–117)
BUN: 21 mg/dL (ref 6–23)
CO2: 26 mEq/L (ref 19–32)
Calcium: 8.6 mg/dL (ref 8.4–10.5)
Chloride: 108 mEq/L (ref 96–112)
Creatinine, Ser: 1.5 mg/dL (ref 0.40–1.50)
GFR: 50.37 mL/min — ABNORMAL LOW (ref >60.00)
Glucose, Bld: 170 mg/dL — ABNORMAL HIGH (ref 70–99)
Potassium: 4.5 mEq/L (ref 3.5–5.1)
Sodium: 139 mEq/L (ref 135–145)
Total Bilirubin: 0.4 mg/dL (ref 0.2–1.2)
Total Protein: 6.8 g/dL (ref 6.0–8.3)

## 2015-12-13 LAB — LIPID PANEL
Cholesterol: 118 mg/dL (ref 0–200)
HDL: 24.6 mg/dL — ABNORMAL LOW (ref >39.00)
LDL Cholesterol: 55 mg/dL (ref 0–99)
NonHDL: 92.96
Total CHOL/HDL Ratio: 5
Triglycerides: 190 mg/dL — ABNORMAL HIGH (ref 0.0–149.0)
VLDL: 38 mg/dL (ref 0.0–40.0)

## 2015-12-13 LAB — MICROALBUMIN / CREATININE URINE RATIO
Creatinine,U: 21.9 mg/dL
Microalb Creat Ratio: 3.2 mg/g (ref 0.0–30.0)
Microalb, Ur: <0.7 mg/dL (ref 0.0–1.9)

## 2015-12-13 LAB — HEMOGLOBIN A1C: Hgb A1c MFr Bld: 6.5 % (ref 4.6–6.5)

## 2015-12-13 NOTE — Progress Notes (Signed)
Pre visit review using our clinic review tool, if applicable. No additional management support is needed unless otherwise documented below in the visit note. 

## 2015-12-13 NOTE — Patient Instructions (Signed)
Labs today.   Follow up in 3 months.  

## 2015-12-13 NOTE — Progress Notes (Signed)
Subjective:    Patient ID: [name] [name], male    DOB: November 02, 1955, 60 y.o.   MRN: [id number]  HPI  60YO male presents for follow up.  Quit smoking.   DM - Worried that BG are higher after quitting smoking. Did not bring record today. Compliant with medications.   Lab Results  Component Value Date   HGBA1C 6.2 09/19/2015   Now being followed by Dr. Trudie Reed for arthritis and psoriasis. Planning to start Prednisone taper today for arthritis. Symptoms of rash from psoriasis have improved considerably with use of new infusion therapy. He is unsure what this is.  Wt Readings from Last 3 Encounters:  12/13/15 261 lb (118.389 kg)  10/27/15 254 lb 12.8 oz (115.577 kg)  10/25/15 256 lb (116.121 kg)   BP Readings from Last 3 Encounters:  12/13/15 132/74  10/27/15 132/78  10/25/15 122/78    Past Medical History  Diagnosis Date  . Peripheral vascular disease ([place])   . Diabetes mellitus   . Mild aortic stenosis   . Tobacco use disorder     recently quit  . Depression   . Psoriasis   . Neuropathy ([place])   . Obesity   . Post splenectomy syndrome   . SOB (shortness of breath)   . Carotid artery occlusion   . Coronary artery disease   . Hyperlipidemia   . Hypertension   . Bell's palsy 2007  . Sleep apnea   . Angina at rest [facility])     chronic  . Cataract     Dr. Dawna Part  . Anxiety   . Arthritis   . DVT (deep venous thrombosis) (HCC)     in leg  . Stroke ([place])   . Allergy   . Heart murmur   . Tubular adenoma 08/2013    Dr. Hilarie Fredrickson  . Adenomatous colon polyp   . Gastropathy   . Duodenitis   . Helicobacter pylori gastritis   . Diverticulosis   . Weak urinary stream    Family History  Problem Relation Age of Onset  . Lung cancer Father 6  . Breast cancer Sister 73  . Diabetes Paternal Grandfather   . Colon polyps Paternal Grandfather 2  . Dementia Mother   . Alzheimer's disease Mother    Past Surgical History  Procedure Laterality Date  . Splenectomy    .  Heart stents  Jan 2011    leg stents 06/2009 and 03/2010  . Coronary artery bypass graft  09/06/2010    At [facility]: LIMA to LAD, left radial to RCA, sequential SVG to OM3 and 4  . Carotid endarterectomy  08/2010    left/ Dr. Kellie Simmering  . Pta of illiac and sfa  multiple    Dr. Ronalee Belts, s/p revision 11/2012  . Colonoscopy    . Cardiac catheterization  08/2010    LAD: 80% ISR, RCA: 80% ostial, OM 80-90%  . Coronary angioplasty      LAD: before CABG   Social History   Social History  . Marital Status: Married    Spouse Name: N/A  . Number of Children: 62  . Years of Education: N/A   Occupational History  .     Social History Main Topics  . Smoking status: Current Some Day Smoker -- 40 years    Types: Cigarettes  . Smokeless tobacco: Never Used     Comment: smokes 2-3 cigarettes every 3 weeks  . Alcohol Use: No  . Drug Use: No  . Sexual  Activity: No   Other Topics Concern  . None   Social History Narrative    Review of Systems  Constitutional: Negative for fever, chills, activity change, appetite change, fatigue and unexpected weight change.  Eyes: Negative for visual disturbance.  Respiratory: Negative for cough and shortness of breath.   Cardiovascular: Negative for chest pain, palpitations and leg swelling.  Gastrointestinal: Negative for nausea, vomiting, abdominal pain, diarrhea, constipation and abdominal distention.  Genitourinary: Negative for dysuria, urgency and difficulty urinating.  Musculoskeletal: Negative for arthralgias and gait problem.  Skin: Negative for color change and rash.  Hematological: Negative for adenopathy.  Psychiatric/Behavioral: Negative for suicidal ideas, sleep disturbance and dysphoric mood. The patient is not nervous/anxious.        Objective:    BP 132/74 mmHg  Pulse 65  Ht 5\' 8"  (1.727 m)  Wt 261 lb (118.389 kg)  BMI 39.69 kg/m2  SpO2 99% Physical Exam  Constitutional: He is oriented to person, place, and time. He appears  well-developed and well-nourished. No distress.  HENT:  Head: Normocephalic and atraumatic.  Right Ear: External ear normal.  Left Ear: External ear normal.  Nose: Nose normal.  Mouth/Throat: Oropharynx is clear and moist. No oropharyngeal exudate.  Eyes: Conjunctivae and EOM are normal. Pupils are equal, round, and reactive to light. Right eye exhibits no discharge. Left eye exhibits no discharge. No scleral icterus.  Neck: Normal range of motion. Neck supple. No tracheal deviation present. No thyromegaly present.  Cardiovascular: Normal rate, regular rhythm and normal heart sounds.  Exam reveals no gallop and no friction rub.   No murmur heard. Pulmonary/Chest: Effort normal and breath sounds normal. No accessory muscle usage. No tachypnea. No respiratory distress. He has no decreased breath sounds. He has no wheezes. He has no rhonchi. He has no rales. He exhibits no tenderness.  Musculoskeletal: Normal range of motion. He exhibits no edema.  Lymphadenopathy:    He has no cervical adenopathy.  Neurological: He is alert and oriented to person, place, and time. No cranial nerve deficit. Coordination normal.  Skin: Skin is warm and dry. No rash noted. He is not diaphoretic. No erythema. No pallor.  Psychiatric: He has a normal mood and affect. His behavior is normal. Judgment and thought content normal.          Assessment & Plan:   Problem List Items Addressed This Visit      Unprioritized   DM (diabetes mellitus), type 2 with peripheral vascular complications ([place]) - Primary (Chronic)    Will check A1c with labs. Continue current medications. Encouraged him to limit intake of processed sugars.      Relevant Orders   Comprehensive metabolic panel   Hemoglobin A1c   Lipid panel   Microalbumin / creatinine urine ratio   Hypertension (Chronic)    BP Readings from Last 3 Encounters:  12/13/15 132/74  10/27/15 132/78  10/25/15 122/78   BP well controlled. Renal function with  labs. Continue current medication.      Psoriasis (Chronic)    Near complete resolution of rash over elbows. Will request records from rheumatology.          Return in about 3 months (around 03/14/2016) for Recheck of Diabetes.  Ronette Deter, MD Internal Medicine [facility]

## 2015-12-13 NOTE — Assessment & Plan Note (Signed)
Near complete resolution of rash over elbows. Will request records from rheumatology.

## 2015-12-13 NOTE — Assessment & Plan Note (Signed)
BP Readings from Last 3 Encounters:  12/13/15 132/74  10/27/15 132/78  10/25/15 122/78   BP well controlled. Renal function with labs. Continue current medication.

## 2015-12-13 NOTE — Assessment & Plan Note (Signed)
Will check A1c with labs. Continue current medications. Encouraged him to limit intake of processed sugars.

## 2015-12-14 ENCOUNTER — Ambulatory Visit (INDEPENDENT_AMBULATORY_CARE_PROVIDER_SITE_OTHER): Payer: Medicare Other | Admitting: Psychology

## 2015-12-14 DIAGNOSIS — F4310 Post-traumatic stress disorder, unspecified: Secondary | ICD-10-CM

## 2015-12-21 ENCOUNTER — Ambulatory Visit (INDEPENDENT_AMBULATORY_CARE_PROVIDER_SITE_OTHER): Payer: Medicare Other | Admitting: Psychology

## 2015-12-21 DIAGNOSIS — F4311 Post-traumatic stress disorder, acute: Secondary | ICD-10-CM

## 2015-12-26 ENCOUNTER — Encounter: Payer: Self-pay | Admitting: Psychiatry

## 2015-12-26 ENCOUNTER — Ambulatory Visit (INDEPENDENT_AMBULATORY_CARE_PROVIDER_SITE_OTHER): Payer: Medicare Other | Admitting: Psychiatry

## 2015-12-26 VITALS — BP 130/82 | HR 64 | Temp 97.8°F | Ht 68.0 in | Wt 257.8 lb

## 2015-12-26 DIAGNOSIS — F331 Major depressive disorder, recurrent, moderate: Secondary | ICD-10-CM | POA: Diagnosis not present

## 2015-12-26 DIAGNOSIS — F419 Anxiety disorder, unspecified: Secondary | ICD-10-CM | POA: Diagnosis not present

## 2015-12-26 DIAGNOSIS — F411 Generalized anxiety disorder: Secondary | ICD-10-CM | POA: Diagnosis not present

## 2015-12-26 MED ORDER — BUSPIRONE HCL 10 MG PO TABS: 20.0000 mg | ORAL_TABLET | Freq: Two times a day (BID) | ORAL | Status: DC

## 2015-12-26 MED ORDER — SERTRALINE HCL 100 MG PO TABS: 150.0000 mg | ORAL_TABLET | Freq: Every day | ORAL | Status: DC

## 2015-12-26 MED ORDER — BUPROPION HCL ER (XL) 150 MG PO TB24: 150.0000 mg | ORAL_TABLET | Freq: Every day | ORAL | Status: DC

## 2015-12-26 NOTE — Progress Notes (Signed)
Patient ID: [name] [name], male   DOB: 06-26-1956, 60 y.o.   MRN: [id number] [facility] MD/PA/NP OP Progress Note  12/26/2015 8:46 AM [name] [name]  MRN:  [id number]  Subjective:  Patient returns for follow-up was generalized anxiety disorder and depression. Patient reports that overall he is doing okay. States that he has learned to take things easy. States that he had gotten tired of seeing some of the doctors and the started to feel that he needs to not get worked up about everything. States that he feels more peaceful now. Patient reports that he recently lost a sister who is 46 years old and suffered from an eating disorder. States that he was able to deal okay with that. He is also doing well with the his wife's adult son and the family living at their home. Sleeping well and eating okay. He is having some pain from his arthritis but dealing okay. Denies any suicidal thoughts. He enjoys being out in the yard. Enjoys going to church on Sundays.  Chief Complaint: good Chief Complaint    Follow-up; Medication Refill     Visit Diagnosis:     ICD-9-CM ICD-10-CM   1. GAD (generalized anxiety disorder) 300.02 F41.1   2. Major depressive disorder, recurrent episode, moderate (HCC) 296.32 F33.1     Past Medical History:  Past Medical History  Diagnosis Date  . Peripheral vascular disease ([place])   . Diabetes mellitus   . Mild aortic stenosis   . Tobacco use disorder     recently quit  . Depression   . Psoriasis   . Neuropathy ([place])   . Obesity   . Post splenectomy syndrome   . SOB (shortness of breath)   . Carotid artery occlusion   . Coronary artery disease   . Hyperlipidemia   . Hypertension   . Bell's palsy 2007  . Sleep apnea   . Angina at rest [facility])     chronic  . Cataract     Dr. Dawna Part  . Anxiety   . Arthritis   . DVT (deep venous thrombosis) (HCC)     in leg  . Stroke ([place])   . Allergy   . Heart murmur   . Tubular adenoma 08/2013    Dr. Hilarie Fredrickson  . Adenomatous colon polyp    . Gastropathy   . Duodenitis   . Helicobacter pylori gastritis   . Diverticulosis   . Weak urinary stream     Past Surgical History  Procedure Laterality Date  . Splenectomy    . Heart stents  Jan 2011    leg stents 06/2009 and 03/2010  . Coronary artery bypass graft  09/06/2010    At [facility]: LIMA to LAD, left radial to RCA, sequential SVG to OM3 and 4  . Carotid endarterectomy  08/2010    left/ Dr. Kellie Simmering  . Pta of illiac and sfa  multiple    Dr. Ronalee Belts, s/p revision 11/2012  . Colonoscopy    . Cardiac catheterization  08/2010    LAD: 80% ISR, RCA: 80% ostial, OM 80-90%  . Coronary angioplasty      LAD: before CABG   Family History:  Family History  Problem Relation Age of Onset  . Lung cancer Father 11  . Breast cancer Sister 39  . Diabetes Paternal Grandfather   . Colon polyps Paternal Grandfather 29  . Dementia Mother   . Alzheimer's disease Mother    Social History:  Social History   Social History  .  Marital Status: Married    Spouse Name: N/A  . Number of Children: 0  . Years of Education: N/A   Occupational History  .     Social History Main Topics  . Smoking status: Current Some Day Smoker -- 40 years    Types: Cigarettes  . Smokeless tobacco: Never Used     Comment: smokes 2-3 cigarettes every 3 weeks  . Alcohol Use: No  . Drug Use: No  . Sexual Activity: No   Other Topics Concern  . None   Social History Narrative   Additional History:   Assessment:   Musculoskeletal: Strength & Muscle Tone: within normal limits Gait & Station: unsteady walks with cane Patient leans: N/A  Psychiatric Specialty Exam: Anxiety Patient reports no insomnia, nervous/anxious behavior or suicidal ideas.    Depression        Associated symptoms include does not have insomnia and no suicidal ideas.  Past medical history includes anxiety.     Review of Systems  Psychiatric/Behavioral: Negative.  Negative for depression, suicidal ideas, hallucinations,  memory loss and substance abuse. The patient is not nervous/anxious and does not have insomnia.   All other systems reviewed and are negative.   Blood pressure 130/82, pulse 64, temperature 97.8 F (36.6 C), temperature source Tympanic, height 5\' 8"  (1.727 m), weight 257 lb 12.8 oz (116.937 kg), SpO2 95 %.Body mass index is 39.21 kg/(m^2).  General Appearance: Well Groomed  Eye Contact:  Good  Speech:  Normal Rate  Volume:  Normal  Mood:  Good  Affect:  Congruent  Thought Process:  Normal  Orientation:  Full (Time, Place, and Person)  Thought Content:  Negative  Suicidal Thoughts:  No  Homicidal Thoughts:  No  Memory:  Immediate;   Good Recent;   Good Remote;   Good  Judgement:  Good  Insight:  Good  Psychomotor Activity:  Negative  Concentration:  Good  Recall:  Good  Fund of Knowledge: Good  Language: Good  Akathisia:  Negative  Handed:  Right  AIMS (if indicated):  N/A  Assets:  Communication Skills Desire for Improvement Social Support  ADL's:  Intact  Cognition: WNL  Sleep:  Good    Is the patient at risk to self?  No. Has the patient been a risk to self in the past 6 months?  No. Has the patient been a risk to self within the distant past?  No. Is the patient a risk to others?  No. Has the patient been a risk to others in the past 6 months?  No. Has the patient been a risk to others within the distant past?  No.  Current Medications: Current Outpatient Prescriptions  Medication Sig Dispense Refill  . albuterol (PROAIR HFA) 108 (90 Base) MCG/ACT inhaler Inhale into the lungs.    [name] aspirin 81 MG tablet Take 81 mg by mouth daily.    [name] BAYER MICROLET LANCETS lancets use as directed three times a day Dx: E11.9 100 each 12  . buPROPion (WELLBUTRIN XL) 150 MG 24 hr tablet Take 1 tablet (150 mg total) by mouth daily. 30 tablet 4  . busPIRone (BUSPAR) 10 MG tablet Take 2 tablets (20 mg total) by mouth 2 (two) times daily. 120 tablet 4  . cholecalciferol (VITAMIN D)  1000 UNITS tablet Take 1,000 Units by mouth daily.      . Cinnamon 500 MG capsule Take 1,000 mg by mouth daily.    . clopidogrel (PLAVIX) 75 MG tablet take 1  tablet by mouth once daily 90 tablet 3  . cyclobenzaprine (FLEXERIL) 10 MG tablet take 1 tablet by mouth three times a day if needed for muscle spasm 30 tablet 2  . finasteride (PROSCAR) 5 MG tablet Take 1 tablet (5 mg total) by mouth daily. 30 tablet 11  . Glucosamine-Chondroit-Vit C-Mn (GLUCOSAMINE CHONDR 500 COMPLEX) CAPS Take 1,000 each by mouth daily.    [name] glucose blood (BAYER CONTOUR TEST) test strip Use to test blood sugar three times daily as instructed.  Dx code: E11.9 100 each 5  . insulin NPH Human (NOVOLIN N RELION) 100 UNIT/ML injection Inject 40 units in am and 20 units in the evening 40 mL 2  . Insulin Pen Needle (NOVOFINE) 32G X 6 MM MISC Use three times daily 100 each 5  . insulin regular (NOVOLIN R,HUMULIN R) 100 units/mL injection Inject 0.1-0.3 mLs (10-30 Units total) into the skin 3 (three) times daily before meals. 40 mL 5  . Insulin Syringe-Needle U-100 (RELION INSULIN SYRINGE 1ML/31G) 31G X 5/16" 1 ML MISC USE THREE TIMES A DAY. 290 each 1  . ketoconazole (NIZORAL) 2 % cream apply to affected area at bedtime  0  . Lancet Device MISC 1 application by Does not apply route as needed. 100 each 11  . losartan (COZAAR) 25 MG tablet take 1 tablet by mouth once daily 90 tablet 3  . magnesium gluconate (MAGONATE) 500 MG tablet Take 1,000 mg by mouth daily.    . metoprolol succinate (TOPROL-XL) 50 MG 24 hr tablet take 1 tablet by mouth twice a day 60 tablet 11  . Multiple Vitamin (MULTIVITAMIN) capsule Take 1 capsule by mouth daily.      . naloxegol oxalate (MOVANTIK) 25 MG TABS tablet Take 1 tablet (25 mg total) by mouth daily. 1 hour before a meal 30 tablet 2  . nitazoxanide (ALINIA) 500 MG tablet Take 1 tablet (500 mg total) by mouth 2 (two) times daily with a meal. 20 tablet 0  . nitroGLYCERIN (NITROSTAT) 0.4 MG SL tablet  Place 1 tablet (0.4 mg total) under the tongue every 5 (five) minutes as needed. 25 tablet 1  . Omega-3 Fatty Acids (FISH OIL PO) Take by mouth.    [name] omeprazole (PRILOSEC) 20 MG capsule Take 1 capsule (20 mg total) by mouth 2 (two) times daily. 20 capsule 0  . oxyCODONE-acetaminophen (ROXICET) 5-325 MG tablet Take 1 tablet by mouth every 8 (eight) hours as needed for severe pain. 60 tablet 0  . polyethylene glycol (MIRALAX) packet Take 17 g by mouth daily. 14 each 3  . predniSONE (DELTASONE) 5 MG tablet take 3 tablets by mouth daily for 3 days - then 2 tablets daily f...  (REFER TO PRESCRIPTION NOTES).  0  . sertraline (ZOLOFT) 100 MG tablet Take 1.5 tablets (150 mg total) by mouth daily. 45 tablet 4  . simvastatin (ZOCOR) 20 MG tablet   0  . tamsulosin (FLOMAX) 0.4 MG CAPS capsule take 2 capsule by mouth daily 60 capsule 11  . topiramate (TOPAMAX) 100 MG tablet Take 1 tablet (100 mg total) by mouth daily. 30 tablet 3  . topiramate (TOPAMAX) 25 MG tablet Take 1 tablet daily for one week, then 2 tablets daily for one week, then 3 tablets daily for one week, then start 100 mg tablets 42 tablet 0  . triamcinolone ointment (KENALOG) 0.1 % apply to affected area twice a day AVOID FACE, GROIN AND UNDERARMS  0   No current facility-administered medications for  this visit.    Medical Decision Making:  Established Problem, Stable/Improving (1) and Review of New Medication or Change in Dosage (2)  Treatment Plan Summary:Medication management and Plan   Depressive disorder, moderate, recurrent-stable We will continue the sertraline at 150 mg daily and  Wellbutrin XL 150 mg in the morning. Continue to see therapist, Dr. Cristy Folks.  Generalized anxiety disorder-stable We'll continue his BuSpar to 20 mg twice daily.    Patient will follow up in 3  months.   Manual Navarra 12/26/2015, 8:46 AM

## 2015-12-29 DIAGNOSIS — L4050 Arthropathic psoriasis, unspecified: Secondary | ICD-10-CM | POA: Diagnosis not present

## 2015-12-29 DIAGNOSIS — R768 Other specified abnormal immunological findings in serum: Secondary | ICD-10-CM | POA: Diagnosis not present

## 2015-12-29 DIAGNOSIS — M150 Primary generalized (osteo)arthritis: Secondary | ICD-10-CM | POA: Diagnosis not present

## 2015-12-29 DIAGNOSIS — M25561 Pain in right knee: Secondary | ICD-10-CM | POA: Diagnosis not present

## 2015-12-29 DIAGNOSIS — L409 Psoriasis, unspecified: Secondary | ICD-10-CM | POA: Diagnosis not present

## 2015-12-29 DIAGNOSIS — M2550 Pain in unspecified joint: Secondary | ICD-10-CM | POA: Diagnosis not present

## 2016-01-04 ENCOUNTER — Ambulatory Visit (INDEPENDENT_AMBULATORY_CARE_PROVIDER_SITE_OTHER): Payer: Medicare Other | Admitting: Psychology

## 2016-01-04 DIAGNOSIS — F4311 Post-traumatic stress disorder, acute: Secondary | ICD-10-CM | POA: Diagnosis not present

## 2016-01-09 DIAGNOSIS — L4050 Arthropathic psoriasis, unspecified: Secondary | ICD-10-CM | POA: Diagnosis not present

## 2016-01-11 ENCOUNTER — Ambulatory Visit (INDEPENDENT_AMBULATORY_CARE_PROVIDER_SITE_OTHER): Payer: Medicare Other | Admitting: Psychology

## 2016-01-11 DIAGNOSIS — F4312 Post-traumatic stress disorder, chronic: Secondary | ICD-10-CM

## 2016-01-18 ENCOUNTER — Ambulatory Visit (INDEPENDENT_AMBULATORY_CARE_PROVIDER_SITE_OTHER): Payer: Medicare Other | Admitting: Psychology

## 2016-01-18 ENCOUNTER — Other Ambulatory Visit: Payer: Self-pay | Admitting: Internal Medicine

## 2016-01-18 DIAGNOSIS — F449 Dissociative and conversion disorder, unspecified: Secondary | ICD-10-CM

## 2016-01-18 DIAGNOSIS — F332 Major depressive disorder, recurrent severe without psychotic features: Secondary | ICD-10-CM

## 2016-01-23 ENCOUNTER — Ambulatory Visit (INDEPENDENT_AMBULATORY_CARE_PROVIDER_SITE_OTHER): Payer: Medicare Other | Admitting: Internal Medicine

## 2016-01-23 ENCOUNTER — Encounter: Payer: Self-pay | Admitting: Internal Medicine

## 2016-01-23 VITALS — BP 136/80 | HR 67 | Ht 66.0 in | Wt 255.6 lb

## 2016-01-23 DIAGNOSIS — R768 Other specified abnormal immunological findings in serum: Secondary | ICD-10-CM

## 2016-01-23 DIAGNOSIS — L409 Psoriasis, unspecified: Secondary | ICD-10-CM

## 2016-01-23 DIAGNOSIS — I10 Essential (primary) hypertension: Secondary | ICD-10-CM | POA: Diagnosis not present

## 2016-01-23 DIAGNOSIS — F329 Major depressive disorder, single episode, unspecified: Secondary | ICD-10-CM

## 2016-01-23 DIAGNOSIS — F32A Depression, unspecified: Secondary | ICD-10-CM

## 2016-01-23 DIAGNOSIS — R894 Abnormal immunological findings in specimens from other organs, systems and tissues: Secondary | ICD-10-CM

## 2016-01-23 DIAGNOSIS — E1151 Type 2 diabetes mellitus with diabetic peripheral angiopathy without gangrene: Secondary | ICD-10-CM | POA: Diagnosis not present

## 2016-01-23 NOTE — Progress Notes (Signed)
Subjective:    Patient ID: [name] [name], male    DOB: [birth year], 60 y.o.   MRN: [id number]  HPI  60YO male presents for follow up.  Difficult time for him. Brother passed away. Working with Dr. Rexene Edison and feels this is helping.  DM - compliant with medication. Did not bring record of blood sugars today.  Lab Results  Component Value Date   HGBA1C 6.5 12/13/2015   Psoriasis has improved somewhat. Continues to follow up with rheumatology. He is receiving an infusion of unknown medication every 8 weeks. Follow up is scheduled.  Wt Readings from Last 3 Encounters:  01/23/16 255 lb 9.6 oz (115.939 kg)  12/26/15 257 lb 12.8 oz (116.937 kg)  12/13/15 261 lb (118.389 kg)   BP Readings from Last 3 Encounters:  01/23/16 136/80  12/26/15 130/82  12/13/15 132/74    Past Medical History  Diagnosis Date  . Peripheral vascular disease ([place])   . Diabetes mellitus   . Mild aortic stenosis   . Tobacco use disorder     recently quit  . Depression   . Psoriasis   . Neuropathy ([place])   . Obesity   . Post splenectomy syndrome   . SOB (shortness of breath)   . Carotid artery occlusion   . Coronary artery disease   . Hyperlipidemia   . Hypertension   . Bell's palsy 2007  . Sleep apnea   . Angina at rest [facility])     chronic  . Cataract     Dr. Dawna Part  . Anxiety   . Arthritis   . DVT (deep venous thrombosis) (HCC)     in leg  . Stroke ([place])   . Allergy   . Heart murmur   . Tubular adenoma 08/2013    Dr. Hilarie Fredrickson  . Adenomatous colon polyp   . Gastropathy   . Duodenitis   . Helicobacter pylori gastritis   . Diverticulosis   . Weak urinary stream    Family History  Problem Relation Age of Onset  . Lung cancer Father 28  . Breast cancer Sister 41  . Diabetes Paternal Grandfather   . Colon polyps Paternal Grandfather 22  . Dementia Mother   . Alzheimer's disease Mother    Past Surgical History  Procedure Laterality Date  . Splenectomy    . Heart stents  Jan 2011   leg stents 06/2009 and 03/2010  . Coronary artery bypass graft  09/06/2010    At [facility]: LIMA to LAD, left radial to RCA, sequential SVG to OM3 and 4  . Carotid endarterectomy  08/2010    left/ Dr. Kellie Simmering  . Pta of illiac and sfa  multiple    Dr. Ronalee Belts, s/p revision 11/2012  . Colonoscopy    . Cardiac catheterization  08/2010    LAD: 80% ISR, RCA: 80% ostial, OM 80-90%  . Coronary angioplasty      LAD: before CABG   Social History   Social History  . Marital Status: Married    Spouse Name: N/A  . Number of Children: 0  . Years of Education: N/A   Occupational History  .     Social History Main Topics  . Smoking status: Current Some Day Smoker -- 40 years    Types: Cigarettes  . Smokeless tobacco: Never Used     Comment: smokes 2-3 cigarettes every 3 weeks  . Alcohol Use: No  . Drug Use: No  . Sexual Activity: No   Other Topics  Concern  . None   Social History Narrative    Review of Systems  Constitutional: Negative for fever, chills, activity change, appetite change, fatigue and unexpected weight change.  Eyes: Negative for visual disturbance.  Respiratory: Negative for cough and shortness of breath.   Cardiovascular: Negative for chest pain, palpitations and leg swelling.  Gastrointestinal: Negative for nausea, vomiting, abdominal pain, diarrhea, constipation and abdominal distention.  Genitourinary: Negative for dysuria, urgency and difficulty urinating.  Musculoskeletal: Negative for arthralgias and gait problem.  Skin: Negative for color change and rash.  Hematological: Negative for adenopathy.  Psychiatric/Behavioral: Positive for dysphoric mood. Negative for suicidal ideas and sleep disturbance. The patient is not nervous/anxious.        Objective:    BP 136/80 mmHg  Pulse 67  Ht 5\' 6"  (1.676 m)  Wt 255 lb 9.6 oz (115.939 kg)  BMI 41.27 kg/m2  SpO2 98% Physical Exam  Constitutional: He is oriented to person, place, and time. He appears well-developed  and well-nourished. No distress.  HENT:  Head: Normocephalic and atraumatic.  Right Ear: External ear normal.  Left Ear: External ear normal.  Nose: Nose normal.  Mouth/Throat: Oropharynx is clear and moist. No oropharyngeal exudate.  Eyes: Conjunctivae and EOM are normal. Pupils are equal, round, and reactive to light. Right eye exhibits no discharge. Left eye exhibits no discharge. No scleral icterus.  Neck: Normal range of motion. Neck supple. No tracheal deviation present. No thyromegaly present.  Cardiovascular: Normal rate, regular rhythm and normal heart sounds.  Exam reveals no gallop and no friction rub.   No murmur heard. Pulmonary/Chest: Effort normal and breath sounds normal. No accessory muscle usage. No tachypnea. No respiratory distress. He has no decreased breath sounds. He has no wheezes. He has no rhonchi. He has no rales. He exhibits no tenderness.  Musculoskeletal: Normal range of motion. He exhibits no edema.  Lymphadenopathy:    He has no cervical adenopathy.  Neurological: He is alert and oriented to person, place, and time. No cranial nerve deficit. Coordination normal.  Skin: Skin is warm and dry. No rash noted. He is not diaphoretic. No erythema. No pallor.  Psychiatric: He has a normal mood and affect. His behavior is normal. Judgment and thought content normal.          Assessment & Plan:   Problem List Items Addressed This Visit      Unprioritized   Depression (Chronic)    Recent worsening of depressed mood, with sudden death of his brother. Offered support today. He feels that medications are helpful and counseling with Dr. Rexene Edison is helpful. Will continue.      DM (diabetes mellitus), type 2 with peripheral vascular complications (HCC) - Primary (Chronic)    BG well controlled by his report. Will continue current medications.      Hepatitis C antibody test positive   Relevant Orders   Hepatitis C antibody   Hepatitis C RNA quantitative    Hypertension (Chronic)    BP Readings from Last 3 Encounters:  01/23/16 136/80  12/26/15 130/82  12/13/15 132/74   BP generally well controlled. Continue current medications.      Psoriasis (Chronic)    Symptoms much improved with current treatment through rheumatology. Most recent notes I have received do not describe "infusion" he mentions. Will request updated notes.          Return in about 4 weeks (around 02/20/2016) for New Patient.  Ronette Deter, MD Internal Medicine [facility]

## 2016-01-23 NOTE — Patient Instructions (Signed)
Labs today to recheck Hepatitis C.

## 2016-01-23 NOTE — Assessment & Plan Note (Signed)
Symptoms much improved with current treatment through rheumatology. Most recent notes I have received do not describe "infusion" he mentions. Will request updated notes.

## 2016-01-23 NOTE — Assessment & Plan Note (Signed)
BP Readings from Last 3 Encounters:  01/23/16 136/80  12/26/15 130/82  12/13/15 132/74   BP generally well controlled. Continue current medications.

## 2016-01-23 NOTE — Assessment & Plan Note (Signed)
BG well controlled by his report. Will continue current medications.

## 2016-01-23 NOTE — Assessment & Plan Note (Signed)
Recent worsening of depressed mood, with sudden death of his brother. Offered support today. He feels that medications are helpful and counseling with Dr. Rexene Edison is helpful. Will continue.

## 2016-01-23 NOTE — Progress Notes (Signed)
Pre visit review using our clinic review tool, if applicable. No additional management support is needed unless otherwise documented below in the visit note. 

## 2016-01-24 LAB — HEPATITIS C ANTIBODY: HCV Ab: REACTIVE — AB

## 2016-01-25 ENCOUNTER — Ambulatory Visit (INDEPENDENT_AMBULATORY_CARE_PROVIDER_SITE_OTHER): Payer: Medicare Other | Admitting: Psychology

## 2016-01-25 DIAGNOSIS — F449 Dissociative and conversion disorder, unspecified: Secondary | ICD-10-CM

## 2016-01-25 DIAGNOSIS — F332 Major depressive disorder, recurrent severe without psychotic features: Secondary | ICD-10-CM

## 2016-01-26 ENCOUNTER — Telehealth: Payer: Self-pay

## 2016-01-26 LAB — HEPATITIS C RNA QUANTITATIVE: HCV Quantitative: NOT DETECTED IU/mL (ref <15)

## 2016-01-26 MED ORDER — LANCET DEVICE MISC: 1.0000 "application " | Status: DC | PRN

## 2016-01-26 MED ORDER — GLUCOSE BLOOD VI STRP: ORAL_STRIP | Status: DC

## 2016-01-26 NOTE — Telephone Encounter (Signed)
Medication refill

## 2016-01-27 ENCOUNTER — Other Ambulatory Visit: Payer: Self-pay

## 2016-01-27 MED ORDER — BAYER MICROLET LANCETS MISC: Status: DC

## 2016-01-30 ENCOUNTER — Telehealth: Payer: Self-pay | Admitting: Neurology

## 2016-01-30 NOTE — Telephone Encounter (Signed)
-----   Message from [place], DO sent at 01/30/2016  3:56 PM EDT ----- Can you check with pharmacy and see if he is picking up his remicade

## 2016-01-30 NOTE — Telephone Encounter (Signed)
Spoke to [employer] and patient has filled Topamax on 01/17/16, 11/21/15, and 11/01/15.

## 2016-02-01 ENCOUNTER — Encounter: Payer: Self-pay | Admitting: Neurology

## 2016-02-01 ENCOUNTER — Ambulatory Visit (INDEPENDENT_AMBULATORY_CARE_PROVIDER_SITE_OTHER): Payer: Medicare Other | Admitting: Neurology

## 2016-02-01 VITALS — BP 120/72 | HR 64 | Ht 66.0 in | Wt 256.0 lb

## 2016-02-01 DIAGNOSIS — L4050 Arthropathic psoriasis, unspecified: Secondary | ICD-10-CM

## 2016-02-01 DIAGNOSIS — M5481 Occipital neuralgia: Secondary | ICD-10-CM

## 2016-02-01 NOTE — Progress Notes (Signed)
[name] [name] was seen today in neurologic consultation at the request of Ronette Deter, MD.   The patient presents today for consultation regarding abnormal MRI of the brain.  This was done because of headache.  The patient is a 60 y.o. year old male with a history of headache.  The patient reports that headache began about 3 months ago.  The location of the headache is in the posterior occiput.  He didn't consider himself a headachey type of person before 3 months ago.  It feels like a sledgehammer.  There is photophobia.  There is lightheadedness and he doesn't drive because of it.  There is no phonophobia.  The patient reports that there is no associated nausea or emesis. He reports soreness of the occiput to the touch.  The headache radiates to the frontal region bilaterally.  The patient reports no autonomic features such as rhinorrhea, conjunctival erythema, or tearing of the eyes.  Balance has not changed.  He doesn't work outside of the home and states that he is disabled from neuropathy and sleep apnea.  Admits to occasional diplopia but states that he had bells palsy 4 different times and has chronic L facial droop and diplopia started then.  States that he first had bells in his 1's and last episode was 2 years ago.  States that he is taking flexeril for the headaches and takes 2 per day.  Admits to [facility] and wears CPAP faithfully.  Had it adjusted 1 year ago and states that pressure decreased from 18 to 14.    I had the opportunity to review the patient's MRI of the brain with and without gadolinium done on 09/30/2015.  There was mild temporal atrophy and a few rare scattered T2 hyperintensities.  The radiology report said that the ventricles were slightly enlarged, but admitted that it could be ex vacuo, but also said could not rule out NPH.  02/01/16 update:  The patient follows up today.  I have reviewed numerous records since last visit.  He had an occipital nerve block on March 21 and  April 3 and these did not provide long-term benefit.  On April 10 and started him on Topamax.  He called me 2 weeks later to state that it did not help.  I explained to the patient that he needed to give it much more time.  The patient states that headaches are much better but admits that he is only taking topamax prn.  He rarely has headache.   I did do lab work several months ago at our last visit and there were several abnormalities.  His sedimentation rate was very high at 90.  He was sent to the rheumatology office and this was repeated and it was within ranges of normal, but his CRP was very elevated at 77.  He was diagnosed with psoriatic arthritis at the rheumatology office and started on Remicade.  His rash is much better and joint pain is much better.  Since our last visit, his brother was found dead in his home which was obviously distressing for him.  He apparently had been dead for quite some time.  He was 60 y/o.     ALLERGIES:   Allergies  Allergen Reactions  . Glipizide Other (See Comments)    ANTIDIABETICS. Burning  . Metrizamide     Got very hot and red  . Penicillins Hives and Swelling  . Contrast Media [Iodinated Diagnostic Agents] Rash and Other (See Comments)    Got very  hot and red Got very hot and red    CURRENT MEDICATIONS:  Current Outpatient Prescriptions on File Prior to Visit  Medication Sig Dispense Refill  . albuterol (PROAIR HFA) 108 (90 Base) MCG/ACT inhaler Inhale into the lungs.    [name] aspirin 81 MG tablet Take 81 mg by mouth daily.    [name] BAYER MICROLET LANCETS lancets use as directed three times a day Dx: E11.9 100 each 12  . buPROPion (WELLBUTRIN XL) 150 MG 24 hr tablet Take 1 tablet (150 mg total) by mouth daily. 30 tablet 4  . busPIRone (BUSPAR) 10 MG tablet Take 2 tablets (20 mg total) by mouth 2 (two) times daily. 120 tablet 4  . cholecalciferol (VITAMIN D) 1000 UNITS tablet Take 1,000 Units by mouth daily.      . Cinnamon 500 MG capsule Take 1,000 mg by  mouth daily.    . clopidogrel (PLAVIX) 75 MG tablet take 1 tablet by mouth once daily 90 tablet 3  . cyclobenzaprine (FLEXERIL) 10 MG tablet take 1 tablet by mouth three times a day if needed for muscle spasm 30 tablet 2  . finasteride (PROSCAR) 5 MG tablet Take 1 tablet (5 mg total) by mouth daily. 30 tablet 11  . Glucosamine-Chondroit-Vit C-Mn (GLUCOSAMINE CHONDR 500 COMPLEX) CAPS Take 1,000 each by mouth daily.    [name] glucose blood (BAYER CONTOUR TEST) test strip Use to test blood sugar three times daily as instructed.  Dx code: E11.9 100 each 5  . insulin NPH Human (NOVOLIN N RELION) 100 UNIT/ML injection Inject 40 units in am and 20 units in the evening 40 mL 2  . Insulin Pen Needle (NOVOFINE) 32G X 6 MM MISC Use three times daily 100 each 5  . insulin regular (NOVOLIN R,HUMULIN R) 100 units/mL injection Inject 0.1-0.3 mLs (10-30 Units total) into the skin 3 (three) times daily before meals. 40 mL 5  . Insulin Syringe-Needle U-100 (RELION INSULIN SYRINGE 1ML/31G) 31G X 5/16" 1 ML MISC USE THREE TIMES A DAY. 290 each 1  . ketoconazole (NIZORAL) 2 % cream apply to affected area at bedtime  0  . Lancet Device MISC 1 application by Does not apply route as needed. 100 each 11  . losartan (COZAAR) 25 MG tablet take 1 tablet by mouth once daily 90 tablet 3  . magnesium gluconate (MAGONATE) 500 MG tablet Take 1,000 mg by mouth daily.    . metoprolol succinate (TOPROL-XL) 50 MG 24 hr tablet take 1 tablet by mouth twice a day 60 tablet 11  . Multiple Vitamin (MULTIVITAMIN) capsule Take 1 capsule by mouth daily.      . naloxegol oxalate (MOVANTIK) 25 MG TABS tablet Take 1 tablet (25 mg total) by mouth daily. 1 hour before a meal 30 tablet 2  . nitazoxanide (ALINIA) 500 MG tablet Take 1 tablet (500 mg total) by mouth 2 (two) times daily with a meal. 20 tablet 0  . nitroGLYCERIN (NITROSTAT) 0.4 MG SL tablet Place 1 tablet (0.4 mg total) under the tongue every 5 (five) minutes as needed. 25 tablet 1  .  Omega-3 Fatty Acids (FISH OIL PO) Take by mouth.    [name] omeprazole (PRILOSEC) 20 MG capsule Take 1 capsule (20 mg total) by mouth 2 (two) times daily. 20 capsule 0  . oxyCODONE-acetaminophen (ROXICET) 5-325 MG tablet Take 1 tablet by mouth every 8 (eight) hours as needed for severe pain. 60 tablet 0  . polyethylene glycol (MIRALAX) packet Take 17 g by mouth daily.  14 each 3  . sertraline (ZOLOFT) 100 MG tablet Take 1.5 tablets (150 mg total) by mouth daily. 45 tablet 4  . simvastatin (ZOCOR) 20 MG tablet take 1 tablet by mouth once daily AT 6PM 90 tablet 3  . tamsulosin (FLOMAX) 0.4 MG CAPS capsule take 2 capsule by mouth daily 60 capsule 11  . topiramate (TOPAMAX) 100 MG tablet Take 1 tablet (100 mg total) by mouth daily. 30 tablet 3  . triamcinolone ointment (KENALOG) 0.1 % apply to affected area twice a day AVOID FACE, GROIN AND UNDERARMS  0   No current facility-administered medications on file prior to visit.    PAST MEDICAL HISTORY:   Past Medical History  Diagnosis Date  . Peripheral vascular disease ([place])   . Diabetes mellitus   . Mild aortic stenosis   . Tobacco use disorder     recently quit  . Depression   . Psoriasis   . Neuropathy ([place])   . Obesity   . Post splenectomy syndrome   . SOB (shortness of breath)   . Carotid artery occlusion   . Coronary artery disease   . Hyperlipidemia   . Hypertension   . Bell's palsy 2007  . Sleep apnea   . Angina at rest [facility])     chronic  . Cataract     Dr. Dawna Part  . Anxiety   . Arthritis   . DVT (deep venous thrombosis) ([facility])     in leg  . Stroke ([place])   . Allergy   . Heart murmur   . Tubular adenoma 08/2013    Dr. Hilarie Fredrickson  . Adenomatous colon polyp   . Gastropathy   . Duodenitis   . Helicobacter pylori gastritis   . Diverticulosis   . Weak urinary stream     PAST SURGICAL HISTORY:   Past Surgical History  Procedure Laterality Date  . Splenectomy    . Heart stents  Jan 2011    leg stents 06/2009 and 03/2010  .  Coronary artery bypass graft  09/06/2010    At [facility]: LIMA to LAD, left radial to RCA, sequential SVG to OM3 and 4  . Carotid endarterectomy  08/2010    left/ Dr. Kellie Simmering  . Pta of illiac and sfa  multiple    Dr. Ronalee Belts, s/p revision 11/2012  . Colonoscopy    . Cardiac catheterization  08/2010    LAD: 80% ISR, RCA: 80% ostial, OM 80-90%  . Coronary angioplasty      LAD: before CABG    SOCIAL HISTORY:   Social History   Social History  . Marital Status: Married    Spouse Name: N/A  . Number of Children: 0  . Years of Education: N/A   Occupational History  .     Social History Main Topics  . Smoking status: Current Some Day Smoker -- 40 years    Types: Cigarettes  . Smokeless tobacco: Never Used     Comment: smokes 2-3 cigarettes every 3 weeks  . Alcohol Use: No  . Drug Use: No  . Sexual Activity: No   Other Topics Concern  . Not on file   Social History Narrative    FAMILY HISTORY:   Family Status  Relation Status Death Age  . Father Deceased     lung cancer  . Sister Deceased     breast cancer  . Mother Alive     dementia, alzheimer's  . Brother Deceased 80    found dead in  home  . Sister Deceased     seizures    ROS:  A complete 10 system review of systems was obtained and was unremarkable apart from what is mentioned above.  PHYSICAL EXAMINATION:    VITALS:   Filed Vitals:   02/01/16 0900  BP: 120/72  Pulse: 64  Height: 5\' 6"  (1.676 m)  Weight: 256 lb (116.121 kg)    GEN:  Appears stated age and in NAD. HEENT:  Normocephalic, atraumatic. The mucous membranes are moist. The superficial temporal arteries are without ropiness or tenderness.  There is tenderness over bilateral occipital notches Cardiovascular: Regular rate and rhythm. Lungs: Clear to auscultation bilaterally. Neck: There are no carotid bruits noted bilaterally. Skin:  The prior psoriasis scales on the elbows are virtually gone.  NEUROLOGICAL: Orientation:  Pt is alert and  oriented x 3.  Cranial nerves: There is mild L facial droop with decreased blink on the L and decreased palpebral fissure on the L.  Speech is fluent and clear. Soft palate rises symmetrically and there is no tongue deviation. Hearing is intact to conversational tone. Tone: Tone is good throughout. Sensation: Sensation is intact to light touch and pinprick throughout (facial, extremities, trunk). Vibration is intact at the bilateral big toe but it is decreased. There is no extinction with double simultaneous stimulation. There is no sensory dermatomal level identified. Coordination:  The patient has no difficulty with RAM's or FNF bilaterally. Motor: Strength is 5/5 in the bilateral upper and lower extremities. Shoulder shrug is equal and symmetric.  There is no pronator drift.  There are no fasciculations noted. DTR's: Deep tendon reflexes are 1/4 at the bilateral biceps, triceps, brachioradialis, 2/4 at the bilateral patella and achilles.  There is bilateral striatal toe although they go down when the plantar surface is scratched. Gait and Station: The patient is able to ambulate without difficulty. The patient is not able to heel toe walk.  He is not able to ambulate in a tandem fashion. The patient is able to stand in the Romberg position.   LABS:  Lab Results  Component Value Date   WBC 9.6 12/21/2014   HGB 12.8* 12/21/2014   HCT 39.3 12/21/2014   MCV 87.9 12/21/2014   PLT 236 12/21/2014     Chemistry      Component Value Date/Time   NA 139 12/13/2015 0846   NA 138 05/04/2014 0816   K 4.5 12/13/2015 0846   K 4.1 05/04/2014 0816   CL 108 12/13/2015 0846   CL 101 05/04/2014 0816   CO2 26 12/13/2015 0846   CO2 29 05/04/2014 0816   BUN 21 12/13/2015 0846   BUN 24* 05/04/2014 0816   CREATININE 1.50 12/13/2015 0846   CREATININE 1.78* 05/04/2014 0816      Component Value Date/Time   CALCIUM 8.6 12/13/2015 0846   CALCIUM 9.3 05/04/2014 0816   ALKPHOS 89 12/13/2015 0846   AST 13  12/13/2015 0846   ALT 13 12/13/2015 0846   BILITOT 0.4 12/13/2015 0846      Lab Results  Component Value Date   HGBA1C 6.5 12/13/2015    Lab Results  Component Value Date   ANA POS* 10/05/2015   RF 34* 10/05/2015    Lab Results  Component Value Date   ANA POS* 10/05/2015    Lab Results  Component Value Date   ESRSEDRATE 90* 10/05/2015     IMPRESSIONS/PLAN:  1.  Headache.  This is most characteristic of bilateral occipital neuralgia.    -  this has resolved.  He is taking topamax in a strange fashion now.  Takes it as needed when he has headache (maybe one time a month) and then will continue for the week.  Thinks works well this way but told him likely could just try ibuprofen prn when gets headache.    2.  Abnormal brain scan  -I was not convinced that all that his MRI showed evidence of NPH.  First, NPH is a clinical diagnosis and he really does not meet criteria for that.  In addition, his MRI did not show significantly enlarged ventricles.  He had some atrophy, which would account for the slightly dilated ventricles.  3.  Psoriatic arthritis  -on remicade.  Seeing Dr. Trudie Reed and doing markedly better  4.  F/u prn

## 2016-02-06 DIAGNOSIS — B351 Tinea unguium: Secondary | ICD-10-CM | POA: Diagnosis not present

## 2016-02-06 DIAGNOSIS — E1142 Type 2 diabetes mellitus with diabetic polyneuropathy: Secondary | ICD-10-CM | POA: Diagnosis not present

## 2016-02-08 ENCOUNTER — Ambulatory Visit (INDEPENDENT_AMBULATORY_CARE_PROVIDER_SITE_OTHER): Payer: Medicare Other | Admitting: Psychology

## 2016-02-08 DIAGNOSIS — F332 Major depressive disorder, recurrent severe without psychotic features: Secondary | ICD-10-CM | POA: Diagnosis not present

## 2016-02-08 DIAGNOSIS — F449 Dissociative and conversion disorder, unspecified: Secondary | ICD-10-CM | POA: Diagnosis not present

## 2016-02-13 ENCOUNTER — Ambulatory Visit (INDEPENDENT_AMBULATORY_CARE_PROVIDER_SITE_OTHER): Payer: Medicare Other | Admitting: Internal Medicine

## 2016-02-13 ENCOUNTER — Encounter: Payer: Self-pay | Admitting: Internal Medicine

## 2016-02-13 VITALS — BP 160/82 | HR 85 | Temp 98.1°F | Wt 262.0 lb

## 2016-02-13 DIAGNOSIS — E1151 Type 2 diabetes mellitus with diabetic peripheral angiopathy without gangrene: Secondary | ICD-10-CM

## 2016-02-13 NOTE — Progress Notes (Signed)
Pre visit review using our clinic review tool, if applicable. No additional management support is needed unless otherwise documented below in the visit note. 

## 2016-02-13 NOTE — Progress Notes (Signed)
Patient ID: [name] [name], male   DOB: 21-May-1956, 60 y.o.   MRN: [id number]  HPI: [name] [name] is a 60 y.o.-year-old male, returning for f/u for DM2, dx 2006, insulin-dependent since 2011, uncontrolled, with complications (CAD - CABG 2012, PVD - s/p Stents, Aortic and carotid stenosis, peripheral neuropathy). Last visit 3.5 mo ago.  His sister and his brother died this summer.  Last hemoglobin A1c: Lab Results  Component Value Date   HGBA1C 6.5 12/13/2015   HGBA1C 6.2 09/19/2015   HGBA1C 6.6 (H) 06/14/2015   We stopped Victoza at a previous visit. He could not afford his insulins (doughnut hole) - 400$.   He is now on: N and R insulins - decreased 04/2015 b/c low CBGs:  Insulin Before breakfast Before lunch Before dinner  Regular - clear (short acting) 20 units - smaller meal 25 units - regular meal 30 units - larger meal  10 units - smaller meal 15 units - regular meal 20 units - larger meal  NPH - cloudy (long acting) 40  20   Pt checks his sugars 3x a day and they are (reviewed log): - am: 95-180, 190 >> 76, 98-163, 180, 190 >> 132-144 >> 88-166, 196, 309 >> 89-154, 193 >> 69, 82-143 >> 78, 86-145 >> 80-139 - 2h after b'fast: 198, 239 >> n/c >> 363, 377 >> 148 >> 139-148 >> n/c >> 161 >> n/c >> 138 >> 219 >> 137-162, 198 - before lunch: 106-207 >> 134-187, 192, 207 >> 108 >> 70, 79, 123-167, 220 >> 133, 179 >> 119-158 >> n/c >> 152-180, 226 - 2h after lunch: 94, 115-189 >> 130-160, 215 >> 70, 101-170, 186 >> 93-179, 217 >> 101-165, 187 >> 94-170, 196 >> 82-167, 215 - before dinner: 120-150 >> 152-188 >> 95-150 >> 87, 95-172, 188 >> n/c >> 101-192 >> 122-147 >> 69-137 >> 175 >> n/c - 2h after dinner:101-136 lately , 175 >> 89, 139-188 >> 83-135 >> 39 x1; 100-143 >> 98-143, 197, 210 >> 115-189, 242 >> 89-182, 251 - bedtime: 127-135 >> 101-276 >> 225 >> n/c >> 106-129 >> 80-135 >> 101-154 >> 79-134 >> 89-134 >> 109-139 >> n/c No lows. Lowest sugar was 69 >> 39 >> 69 >> 78 >>  80; ? hypoglycemia awareness. Highest sugar was 377 >> 272 >> 224 >> 254 - few 200s >> 217 >> 210x1 >> 242 >> 226.  Pt's meals are: - Breakfast: toast + eggs or cereals + milk - Lunch: soup + 1/2 sandwich - Dinner: meat + sweet potatoes or pasta + salad - Snacks: 3: sugar free foods, fruit He quit drinking sodas, but drinks tea - artificial sweetener. He also started to drink Almond milk. Exercises by walking 2x a day.  - He has CKD, last BUN/creatinine:  Lab Results  Component Value Date   BUN 21 12/13/2015   CREATININE 1.50 12/13/2015  He is on Cozaar. Sees nephrology. Has a stone in L kidney, has 3 cysts in R kidney. - last set of lipids: Lab Results  Component Value Date   CHOL 118 12/13/2015   HDL 24.60 (L) 12/13/2015   LDLCALC 55 12/13/2015   LDLDIRECT 64.9 06/01/2014   TRIG 190.0 (H) 12/13/2015   CHOLHDL 5 12/13/2015  He  Is on Zocor. - last [facility] was [facility] DR. + cataracts. - + numbness and tingling in his feet - [facility]) - Dr Elvina Mattes.   He has pain in L leg >>  surgery for PVD stents on 04/27/2014. His sugars improved after the stent.   I reviewed pt's medications, allergies, PMH, social hx, family hx, and changes were documented in the history of present illness. Otherwise, unchanged from my initial visit note.   ROS: Constitutional:  weight loss, no fatigue, no subjective hyperthermia  Eyes: no blurry vision, no xerophthalmia ENT: no sore throat, no nodules palpated in throat, no dysphagia/no odynophagia, no hoarseness Cardiovascular: no CP/SOB/palpitations/leg swelling Respiratory: + cough/no SOB Gastrointestinal: no N/V/+ D/no C Musculoskeletal: no muscle/joint aches Skin: + rash - face Neurological: no tremors/numbness/tingling/dizziness, + HA + diff with erections  PE: BP (!) 160/82   Pulse 85   Temp 98.1 F (36.7 C) (Oral)   Wt 262 lb (118.8 kg)   SpO2 97%   BMI 42.29 kg/m  Body mass index is 42.29  kg/m. Wt Readings from Last 3 Encounters:  02/13/16 262 lb (118.8 kg)  02/01/16 256 lb (116.1 kg)  01/23/16 255 lb 9.6 oz (115.9 kg)   Constitutional: obese, in NAD, ruddy complexion Eyes: PERRLA, EOMI, no exophthalmos ENT: dry mucous membranes, no thyromegaly, no cervical lymphadenopathy Cardiovascular: RRR, +3/6 SEM, no RG Respiratory: CTA B Gastrointestinal: abdomen soft, NT, ND, BS+ Musculoskeletal: no deformities, strength intact in all 4 Skin: moist, warm, + rash - rosacea on face  Neurological: no tremor with outstretched hands, DTR normal in all 4  ASSESSMENT: 1. DM2, insulin-dependent, uncontrolled, with complications - CAD - s/p CABG 2012 - PVD - s/p stents 2012 - Aortic stenosis - carotid stenosis - CKD - peripheral neuropathy  - He read Dr Janene Harvey book: Program for Reversing Diabetes".  PLAN:  1. Patient with long-standing, uncontrolled diabetes, on injectable regimen. Diabetes has improved in last few months >> no more lows and only few highs - will not change regimen - I advised him to Patient Instructions   Please continue:  Insulin Before breakfast Before lunch Before dinner  Regular - clear (short acting) 20 units - smaller meal 25 units - regular meal 30 units - larger meal  10 units - smaller meal 15 units - regular meal 20 units - larger meal  NPH - cloudy (long acting) 40  20   Please come back for a follow-up appointment in 3 months.  - continue to check 3 times a day, rotating checks - up to date with eye exams - Return to clinic in 3 mo with sugar log

## 2016-02-13 NOTE — Patient Instructions (Signed)
Patient Instructions   Please continue:  Insulin Before breakfast Before lunch Before dinner  Regular - clear (short acting) 20 units - smaller meal 25 units - regular meal 30 units - larger meal  10 units - smaller meal 15 units - regular meal 20 units - larger meal  NPH - cloudy (long acting) 40  20   Please come back for a follow-up appointment in 3 months.

## 2016-02-15 ENCOUNTER — Ambulatory Visit: Payer: Medicare Other | Admitting: Psychology

## 2016-02-24 NOTE — Telephone Encounter (Signed)
[facility] pharmacy called about pt needing a DWO form that has to be filled out. Form was faxed again today. Thank you!

## 2016-02-24 NOTE — Telephone Encounter (Signed)
Error disregard Lavella Lemons

## 2016-02-24 NOTE — Telephone Encounter (Signed)
Noted thanks °

## 2016-02-29 ENCOUNTER — Ambulatory Visit (INDEPENDENT_AMBULATORY_CARE_PROVIDER_SITE_OTHER): Payer: Medicare Other | Admitting: Psychology

## 2016-02-29 DIAGNOSIS — F332 Major depressive disorder, recurrent severe without psychotic features: Secondary | ICD-10-CM | POA: Diagnosis not present

## 2016-02-29 DIAGNOSIS — F449 Dissociative and conversion disorder, unspecified: Secondary | ICD-10-CM

## 2016-03-05 DIAGNOSIS — L4050 Arthropathic psoriasis, unspecified: Secondary | ICD-10-CM | POA: Diagnosis not present

## 2016-03-06 ENCOUNTER — Encounter: Payer: Self-pay | Admitting: Family

## 2016-03-06 ENCOUNTER — Ambulatory Visit (INDEPENDENT_AMBULATORY_CARE_PROVIDER_SITE_OTHER): Payer: Medicare Other | Admitting: Family

## 2016-03-06 VITALS — BP 152/80 | HR 67 | Temp 98.0°F | Wt 259.0 lb

## 2016-03-06 DIAGNOSIS — E1151 Type 2 diabetes mellitus with diabetic peripheral angiopathy without gangrene: Secondary | ICD-10-CM

## 2016-03-06 DIAGNOSIS — Z0000 Encounter for general adult medical examination without abnormal findings: Secondary | ICD-10-CM | POA: Diagnosis not present

## 2016-03-06 DIAGNOSIS — G629 Polyneuropathy, unspecified: Secondary | ICD-10-CM | POA: Diagnosis not present

## 2016-03-06 DIAGNOSIS — I10 Essential (primary) hypertension: Secondary | ICD-10-CM

## 2016-03-06 LAB — PSA: PSA: 0.05 ng/mL — ABNORMAL LOW (ref 0.10–4.00)

## 2016-03-06 LAB — HEMOGLOBIN A1C: Hgb A1c MFr Bld: 6.2 % (ref 4.6–6.5)

## 2016-03-06 MED ORDER — CYCLOBENZAPRINE HCL 10 MG PO TABS: ORAL_TABLET | ORAL | 0 refills | Status: DC

## 2016-03-06 NOTE — Assessment & Plan Note (Addendum)
Stable. Cannot tolerate gabapentin. Uses flexeral on occasaion for neuropathy.Refilled medication as requested.

## 2016-03-06 NOTE — Assessment & Plan Note (Signed)
Elevated today. On cozaar, toprol. Known murmur. No chest pain, palpitations.Follows with cardiology

## 2016-03-06 NOTE — Progress Notes (Signed)
Subjective:    Patient ID: [name] [name], male    DOB: [birth year], 60 y.o.   MRN: [id number]  CC: [name] [name] is a 60 y.o. male who presents today for follow up.   HPI: Patient for follow-up of chronic conditions and to establish care.  DM: A1c 5 months ago was 6.5%. Averages 139. Occasional neuropathy which responds to flexeril PRN. UTD with eye exam and has f/u in 3 months with opthamology.   HTN: Follows with cardiology. No chest pain or palpitations. Known heart murmur. S/p bypass 2012.   Prevention: Has over 30 year pack year smoking history and would like to do low-dose CT scan for lung cancer screening.Colonoscopy done 2016 and surgical pathology report was negative for malignancy. Will also check PSA today.          HISTORY:  Past Medical History:  Diagnosis Date  . Adenomatous colon polyp   . Allergy   . Angina at [facility])    chronic  . Anxiety   . Arthritis   . Bell's palsy 2007  . Carotid artery occlusion   . Cataract    Dr. Dawna Part  . Coronary artery disease   . Depression   . Diabetes mellitus   . Diverticulosis   . Duodenitis   . DVT (deep venous thrombosis) (HCC)    in leg  . Gastropathy   . Heart murmur   . Helicobacter pylori gastritis   . Hyperlipidemia   . Hypertension   . Mild aortic stenosis   . Neuropathy ([place])   . Obesity   . Peripheral vascular disease ([place])   . Post splenectomy syndrome   . Psoriasis   . Sleep apnea   . SOB (shortness of breath)   . Stroke ([place])   . Tobacco use disorder    recently quit  . Tubular adenoma 08/2013   Dr. Hilarie Fredrickson  . Weak urinary stream    Past Surgical History:  Procedure Laterality Date  . CARDIAC CATHETERIZATION  08/2010   LAD: 80% ISR, RCA: 80% ostial, OM 80-90%  . CAROTID ENDARTERECTOMY  08/2010   left/ Dr. Kellie Simmering  . COLONOSCOPY    . CORONARY ANGIOPLASTY     LAD: before CABG  . CORONARY ARTERY BYPASS GRAFT  09/06/2010   At [facility]: LIMA to LAD, left radial to RCA, sequential SVG to  OM3 and 4  . heart stents  Jan 2011   leg stents 06/2009 and 03/2010  . PTA of illiac and SFA  multiple   Dr. Ronalee Belts, s/p revision 11/2012  . SPLENECTOMY     Family History  Problem Relation Age of Onset  . Lung cancer Father 64  . Breast cancer Sister 30  . Dementia Mother   . Alzheimer's disease Mother   . Diabetes Paternal Grandfather   . Colon polyps Paternal Grandfather 40    Allergies: Glipizide; Metrizamide; Penicillins; and Contrast media [iodinated diagnostic agents] Current Outpatient Prescriptions on File Prior to Visit  Medication Sig Dispense Refill  . albuterol (PROAIR HFA) 108 (90 Base) MCG/ACT inhaler Inhale into the lungs.    [name] aspirin 81 MG tablet Take 81 mg by mouth daily.    [name] BAYER MICROLET LANCETS lancets use as directed three times a day Dx: E11.9 100 each 12  . buPROPion (WELLBUTRIN XL) 150 MG 24 hr tablet Take 1 tablet (150 mg total) by mouth daily. 30 tablet 4  . busPIRone (BUSPAR) 10 MG tablet Take 2 tablets (20 mg total) by  mouth 2 (two) times daily. 120 tablet 4  . cholecalciferol (VITAMIN D) 1000 UNITS tablet Take 1,000 Units by mouth daily.      . Cinnamon 500 MG capsule Take 1,000 mg by mouth daily.    . clopidogrel (PLAVIX) 75 MG tablet take 1 tablet by mouth once daily 90 tablet 3  . finasteride (PROSCAR) 5 MG tablet Take 1 tablet (5 mg total) by mouth daily. 30 tablet 11  . Glucosamine-Chondroit-Vit C-Mn (GLUCOSAMINE CHONDR 500 COMPLEX) CAPS Take 1,000 each by mouth daily.    . insulin NPH Human (NOVOLIN N RELION) 100 UNIT/ML injection Inject 40 units in am and 20 units in the evening 40 mL 2  . Insulin Pen Needle (NOVOFINE) 32G X 6 MM MISC Use three times daily 100 each 5  . insulin regular (NOVOLIN R,HUMULIN R) 100 units/mL injection Inject 0.1-0.3 mLs (10-30 Units total) into the skin 3 (three) times daily before meals. 40 mL 5  . Insulin Syringe-Needle U-100 (RELION INSULIN SYRINGE 1ML/31G) 31G X 5/16" 1 ML MISC USE THREE TIMES A DAY. 290 each 1    . ketoconazole (NIZORAL) 2 % cream apply to affected area at bedtime  0  . Lancet Device MISC 1 application by Does not apply route as needed. 100 each 11  . losartan (COZAAR) 25 MG tablet take 1 tablet by mouth once daily 90 tablet 3  . magnesium gluconate (MAGONATE) 500 MG tablet Take 1,000 mg by mouth daily.    . metoprolol succinate (TOPROL-XL) 50 MG 24 hr tablet take 1 tablet by mouth twice a day 60 tablet 11  . Multiple Vitamin (MULTIVITAMIN) capsule Take 1 capsule by mouth daily.      . naloxegol oxalate (MOVANTIK) 25 MG TABS tablet Take 1 tablet (25 mg total) by mouth daily. 1 hour before a meal 30 tablet 2  . nitazoxanide (ALINIA) 500 MG tablet Take 1 tablet (500 mg total) by mouth 2 (two) times daily with a meal. 20 tablet 0  . nitroGLYCERIN (NITROSTAT) 0.4 MG SL tablet Place 1 tablet (0.4 mg total) under the tongue every 5 (five) minutes as needed. 25 tablet 1  . Omega-3 Fatty Acids (FISH OIL PO) Take by mouth.    [name] omeprazole (PRILOSEC) 20 MG capsule Take 1 capsule (20 mg total) by mouth 2 (two) times daily. 20 capsule 0  . polyethylene glycol (MIRALAX) packet Take 17 g by mouth daily. 14 each 3  . sertraline (ZOLOFT) 100 MG tablet Take 1.5 tablets (150 mg total) by mouth daily. 45 tablet 4  . simvastatin (ZOCOR) 20 MG tablet take 1 tablet by mouth once daily AT 6PM 90 tablet 3  . tamsulosin (FLOMAX) 0.4 MG CAPS capsule take 2 capsule by mouth daily 60 capsule 11  . topiramate (TOPAMAX) 100 MG tablet Take 1 tablet (100 mg total) by mouth daily. 30 tablet 3  . triamcinolone ointment (KENALOG) 0.1 % apply to affected area twice a day AVOID FACE, GROIN AND UNDERARMS  0   No current facility-administered medications on file prior to visit.     Social History  Substance Use Topics  . Smoking status: Current Some Day Smoker    Years: 40.00    Types: Cigarettes  . Smokeless tobacco: Never Used     Comment: smokes 2-3 cigarettes every 3 weeks  . Alcohol use No    Review of  Systems  Constitutional: Negative for chills and fever.  Eyes: Negative for visual disturbance.  Respiratory: Negative for cough.  Cardiovascular: Negative for chest pain and palpitations.  Gastrointestinal: Negative for nausea and vomiting.      Objective:    BP (!) 152/80   Pulse 67   Temp 98 F (36.7 C) (Oral)   Wt 259 lb (117.5 kg)   SpO2 97%   BMI 41.80 kg/m  BP Readings from Last 3 Encounters:  03/06/16 (!) 152/80  02/13/16 (!) 160/82  02/01/16 120/72   Wt Readings from Last 3 Encounters:  03/06/16 259 lb (117.5 kg)  02/13/16 262 lb (118.8 kg)  02/01/16 256 lb (116.1 kg)    Physical Exam  Constitutional: He appears well-developed and well-nourished.  Cardiovascular: Normal rate and regular rhythm.   Murmur heard.  Systolic murmur is present with a grade of 3/6  SEM II/VI, Loudest LSB, non radiating, no thrill   Pulmonary/Chest: Effort normal and breath sounds normal. No respiratory distress. He has no wheezes. He has no rhonchi. He has no rales.  Neurological: He is alert.  Skin: Skin is warm and dry.  Psychiatric: He has a normal mood and affect. His speech is normal and behavior is normal.  Vitals reviewed.      Assessment & Plan:   Problem List Items Addressed This Visit      Cardiovascular and Mediastinum   Hypertension (Chronic)    Elevated today. On cozaar, toprol. Known murmur. No chest pain, palpitations.Follows with cardiology        Endocrine   DM (diabetes mellitus), type 2 with peripheral vascular complications (HCC) (Chronic)    Stable. Checking A1C today.         Nervous and Auditory   Neuropathy (HCC) (Chronic)    Stable. Cannot tolerate gabapentin. Uses flexeral on occasaion for neuropathy.Refilled medication as requested.       Relevant Medications   cyclobenzaprine (FLEXERIL) 10 MG tablet    Other Visit Diagnoses    Routine physical examination    -  Primary   Relevant Orders   Hemoglobin A1c (Completed)   PSA  (Completed)   CT CHEST LUNG CANCER SCREENING LOW DOSE WO CONTRAST       I have discontinued Mr. Schmick oxyCODONE-acetaminophen. I am also having him maintain his cholecalciferol, multivitamin, nitroGLYCERIN, Cinnamon, magnesium gluconate, GLUCOSAMINE CHONDR 500 COMPLEX, nitazoxanide, omeprazole, aspirin, polyethylene glycol, Insulin Pen Needle, losartan, Omega-3 Fatty Acids (FISH OIL PO), naloxegol oxalate, ketoconazole, triamcinolone ointment, clopidogrel, tamsulosin, finasteride, albuterol, Insulin Syringe-Needle U-100, insulin NPH Human, insulin regular, metoprolol succinate, topiramate, buPROPion, busPIRone, sertraline, simvastatin, Lancet Device, BAYER MICROLET LANCETS, and cyclobenzaprine.   Meds ordered this encounter  Medications  . cyclobenzaprine (FLEXERIL) 10 MG tablet    Sig: take 1 tablet by mouth three times a day if needed for muscle spasm    Dispense:  30 tablet    Refill:  0    Return precautions given.   Risks, benefits, and alternatives of the medications and treatment plan prescribed today were discussed, and patient expressed understanding.   Education regarding symptom management and diagnosis given to patient on AVS.  Continue to follow with Ronette Deter, MD (Inactive) for routine health maintenance.   [name] [name] and I agreed with plan.   Mable Paris, FNP

## 2016-03-06 NOTE — Progress Notes (Signed)
Pre visit review using our clinic review tool, if applicable. No additional management support is needed unless otherwise documented below in the visit note. 

## 2016-03-06 NOTE — Patient Instructions (Signed)
Pleasure meeting you.   Peripheral Neuropathy Peripheral neuropathy is a type of nerve damage. It affects nerves that carry signals between the spinal cord and other parts of the body. These are called peripheral nerves. With peripheral neuropathy, one nerve or a group of nerves may be damaged.  CAUSES  Many things can damage peripheral nerves. For some people with peripheral neuropathy, the cause is unknown. Some causes include:  Diabetes. This is the most common cause of peripheral neuropathy.  Injury to a nerve.  Pressure or stress on a nerve that lasts a long time.  Too little vitamin B. Alcoholism can lead to this.  Infections.  Autoimmune diseases, such as multiple sclerosis and systemic lupus erythematosus.  Inherited nerve diseases.  Some medicines, such as cancer drugs.  Toxic substances, such as lead and mercury.  Too little blood flowing to the legs.  Kidney disease.  Thyroid disease. SIGNS AND SYMPTOMS  Different people have different symptoms. The symptoms you have will depend on which of your nerves is damaged. Common symptoms include:  Loss of feeling (numbness) in the feet and hands.  Tingling in the feet and hands.  Pain that burns.  Very sensitive skin.  Weakness.  Not being able to move a part of the body (paralysis).  Muscle twitching.  Clumsiness or poor coordination.  Loss of balance.  Not being able to control your bladder.  Feeling dizzy.  Sexual problems. DIAGNOSIS  Peripheral neuropathy is a symptom, not a disease. Finding the cause of peripheral neuropathy can be hard. To figure that out, your health care provider will take a medical history and do a physical exam. A neurological exam will also be done. This involves checking things affected by your brain, spinal cord, and nerves (nervous system). For example, your health care provider will check your reflexes, how you move, and what you can feel.  Other types of tests may also be  ordered, such as:  Blood tests.  A test of the fluid in your spinal cord.  Imaging tests, such as CT scans or an MRI.  Electromyography (EMG). This test checks the nerves that control muscles.  Nerve conduction velocity tests. These tests check how fast messages pass through your nerves.  Nerve biopsy. A small piece of nerve is removed. It is then checked under a microscope. TREATMENT   Medicine is often used to treat peripheral neuropathy. Medicines may include:  Pain-relieving medicines. Prescription or over-the-counter medicine may be suggested.  Antiseizure medicine. This may be used for pain.  Antidepressants. These also may help ease pain from neuropathy.  Lidocaine. This is a numbing medicine. You might wear a patch or be given a shot.  Mexiletine. This medicine is typically used to help control irregular heart rhythms.  Surgery. Surgery may be needed to relieve pressure on a nerve or to destroy a nerve that is causing pain.  Physical therapy to help movement.  Assistive devices to help movement. HOME CARE INSTRUCTIONS   Only take over-the-counter or prescription medicines as directed by your health care provider. Follow the instructions carefully for any given medicines. Do not take any other medicines without first getting approval from your health care provider.  If you have diabetes, work closely with your health care provider to keep your blood sugar under control.  If you have numbness in your feet:  Check every day for signs of injury or infection. Watch for redness, warmth, and swelling.  Wear padded socks and comfortable shoes. These help protect your  feet.  Do not do things that put pressure on your damaged nerve.  Do not smoke. Smoking keeps blood from getting to damaged nerves.  Avoid or limit alcohol. Too much alcohol can cause a lack of B vitamins. These vitamins are needed for healthy nerves.  Develop a good support system. Coping with  peripheral neuropathy can be stressful. Talk to a mental health specialist or join a support group if you are struggling.  Follow up with your health care provider as directed. SEEK MEDICAL CARE IF:   You have new signs or symptoms of peripheral neuropathy.  You are struggling emotionally from dealing with peripheral neuropathy.  You have a fever. SEEK IMMEDIATE MEDICAL CARE IF:   You have an injury or infection that is not healing.  You feel very dizzy or begin vomiting.  You have chest pain.  You have trouble breathing.   This information is not intended to replace advice given to you by your health care provider. Make sure you discuss any questions you have with your health care provider.   Document Released: 06/29/2002 Document Revised: 03/21/2011 Document Reviewed: 03/16/2013 Elsevier Interactive Patient Education [employer].

## 2016-03-07 ENCOUNTER — Telehealth: Payer: Self-pay | Admitting: *Deleted

## 2016-03-07 ENCOUNTER — Other Ambulatory Visit: Payer: Self-pay

## 2016-03-07 ENCOUNTER — Ambulatory Visit (INDEPENDENT_AMBULATORY_CARE_PROVIDER_SITE_OTHER): Payer: Medicare Other | Admitting: Psychology

## 2016-03-07 DIAGNOSIS — F4310 Post-traumatic stress disorder, unspecified: Secondary | ICD-10-CM

## 2016-03-07 MED ORDER — GLUCOSE BLOOD VI STRP: ORAL_STRIP | 5 refills | Status: DC

## 2016-03-07 NOTE — Telephone Encounter (Signed)
notify them that it will need to be refaxed then, thanks

## 2016-03-07 NOTE — Telephone Encounter (Signed)
[facility] stated that they did not need another prescription for test strips, however they requested to have the [place] filled out and faxed back over. [employer]O form was faxed over on yesterday.  Fax [phone]

## 2016-03-07 NOTE — Telephone Encounter (Signed)
Medication refilled

## 2016-03-08 NOTE — Telephone Encounter (Signed)
Form faxed over on 08/17

## 2016-03-08 NOTE — Assessment & Plan Note (Signed)
Stable. Checking A1C today.

## 2016-03-12 ENCOUNTER — Telehealth: Payer: Self-pay | Admitting: Family

## 2016-03-12 NOTE — Telephone Encounter (Signed)
Per a representative from [facility], Mr. Marchak went over to the hospital because he was told he can do that and make a CT scan appointment himself. He was then told he couldn't and he's wondering if the CT is scheduled. In informed he and the representative that I didn't see a scan currently scheduled. Please give him a phone call regarding this.  Pt's ph# [phone] Thank you.

## 2016-03-13 DIAGNOSIS — Z23 Encounter for immunization: Secondary | ICD-10-CM | POA: Diagnosis not present

## 2016-03-15 ENCOUNTER — Ambulatory Visit: Payer: Self-pay | Admitting: Internal Medicine

## 2016-03-16 ENCOUNTER — Ambulatory Visit (INDEPENDENT_AMBULATORY_CARE_PROVIDER_SITE_OTHER): Payer: Medicare Other | Admitting: Psychology

## 2016-03-16 ENCOUNTER — Telehealth: Payer: Self-pay | Admitting: *Deleted

## 2016-03-16 DIAGNOSIS — F4310 Post-traumatic stress disorder, unspecified: Secondary | ICD-10-CM | POA: Diagnosis not present

## 2016-03-16 NOTE — Telephone Encounter (Signed)
Received referral for low dose lung cancer screening CT scan. Voicemail left at phone number listed in EMR for patient to call me back to facilitate scheduling scan.  

## 2016-03-19 ENCOUNTER — Telehealth: Payer: Self-pay | Admitting: *Deleted

## 2016-03-19 NOTE — Telephone Encounter (Signed)
Received referral for initial lung cancer screening scan. Contacted patient and obtained smoking history,(current, 30 pack year) as well as answering questions related to screening process. Patient denies signs of lung cancer such as weight loss or hemoptysis. Patient denies comorbidity that would prevent curative treatment if lung cancer were found. Patient is tentatively scheduled for shared decision making visit and CT scan on 03/27/16 at 1:30pm, pending insurance approval from business office.

## 2016-03-23 ENCOUNTER — Other Ambulatory Visit: Payer: Self-pay | Admitting: Cardiovascular Disease

## 2016-03-23 ENCOUNTER — Other Ambulatory Visit: Payer: Self-pay | Admitting: *Deleted

## 2016-03-23 DIAGNOSIS — I6523 Occlusion and stenosis of bilateral carotid arteries: Secondary | ICD-10-CM

## 2016-03-23 DIAGNOSIS — Z87891 Personal history of nicotine dependence: Secondary | ICD-10-CM

## 2016-03-27 ENCOUNTER — Ambulatory Visit
Admission: RE | Admit: 2016-03-27 | Discharge: 2016-03-27 | Disposition: A | Discharge status: Home / Self Care | Payer: Medicare Other | Source: Ambulatory Visit | Attending: Oncology | Admitting: Oncology

## 2016-03-27 ENCOUNTER — Encounter: Payer: Self-pay | Admitting: Oncology

## 2016-03-27 ENCOUNTER — Ambulatory Visit: Payer: Medicare Other | Admitting: Psychiatry

## 2016-03-27 ENCOUNTER — Encounter: Payer: Self-pay | Admitting: Psychiatry

## 2016-03-27 ENCOUNTER — Inpatient Hospital Stay: Payer: Medicare Other | Attending: Oncology | Admitting: Oncology

## 2016-03-27 DIAGNOSIS — J439 Emphysema, unspecified: Secondary | ICD-10-CM | POA: Diagnosis not present

## 2016-03-27 DIAGNOSIS — Z87891 Personal history of nicotine dependence: Secondary | ICD-10-CM

## 2016-03-27 DIAGNOSIS — Z122 Encounter for screening for malignant neoplasm of respiratory organs: Secondary | ICD-10-CM | POA: Diagnosis not present

## 2016-03-27 DIAGNOSIS — I700 Atherosclerosis of aorta: Secondary | ICD-10-CM | POA: Diagnosis not present

## 2016-03-28 NOTE — Progress Notes (Signed)
In accordance with CMS guidelines, patient has met eligibility criteria including age, absence of signs or symptoms of lung cancer.  Social History  Substance Use Topics  . Smoking status: Current Every Day Smoker    Packs/day: 0.75    Years: 40.00    Types: Cigarettes  . Smokeless tobacco: Never Used     Comment: smokes 2-3 cigarettes every 3 weeks  . Alcohol use No     A shared decision-making session was conducted prior to the performance of CT scan. This includes one or more decision aids, includes benefits and harms of screening, follow-up diagnostic testing, over-diagnosis, false positive rate, and total radiation exposure.  Counseling on the importance of adherence to annual lung cancer LDCT screening, impact of co-morbidities, and ability or willingness to undergo diagnosis and treatment is imperative for compliance of the program.  Counseling on the importance of continued smoking cessation for former smokers; the importance of smoking cessation for current smokers, and information about tobacco cessation interventions have been given to patient including [place] and [facility] [place] programs.  Written order for lung cancer screening with LDCT has been given to the patient and any and all questions have been answered to the best of my abilities.   Yearly follow up will be coordinated by Burgess Estelle, Thoracic Navigator.

## 2016-03-29 ENCOUNTER — Encounter: Payer: Self-pay | Admitting: Family

## 2016-03-29 ENCOUNTER — Telehealth: Payer: Self-pay | Admitting: *Deleted

## 2016-03-29 DIAGNOSIS — R911 Solitary pulmonary nodule: Secondary | ICD-10-CM | POA: Insufficient documentation

## 2016-03-29 DIAGNOSIS — IMO0001 Reserved for inherently not codable concepts without codable children: Secondary | ICD-10-CM | POA: Insufficient documentation

## 2016-03-29 DIAGNOSIS — J439 Emphysema, unspecified: Secondary | ICD-10-CM

## 2016-03-29 DIAGNOSIS — J209 Acute bronchitis, unspecified: Secondary | ICD-10-CM | POA: Insufficient documentation

## 2016-03-29 DIAGNOSIS — J440 Chronic obstructive pulmonary disease with acute lower respiratory infection: Secondary | ICD-10-CM

## 2016-03-29 NOTE — Telephone Encounter (Signed)
Notified patient of LDCT lung cancer screening results with recommendation for 12 month follow up imaging. Also notified of incidental finding noted below. Patient verbalizes understanding.   IMPRESSION: 1. Lung-RADS Category 2, benign appearance or behavior. Continue annual screening with low-dose chest CT without contrast in 12 months. 2. Emphysema. 3. Thoracic aortic atherosclerosis.

## 2016-03-29 NOTE — Telephone Encounter (Signed)
Noted to chart. Patient on statin therapy. Patient follows with cardiology.

## 2016-03-30 ENCOUNTER — Ambulatory Visit: Payer: Medicare Other

## 2016-03-30 DIAGNOSIS — M5481 Occipital neuralgia: Secondary | ICD-10-CM | POA: Diagnosis not present

## 2016-03-30 DIAGNOSIS — I6523 Occlusion and stenosis of bilateral carotid arteries: Secondary | ICD-10-CM | POA: Diagnosis not present

## 2016-03-30 DIAGNOSIS — M150 Primary generalized (osteo)arthritis: Secondary | ICD-10-CM | POA: Diagnosis not present

## 2016-03-30 DIAGNOSIS — L409 Psoriasis, unspecified: Secondary | ICD-10-CM | POA: Diagnosis not present

## 2016-03-30 DIAGNOSIS — M2550 Pain in unspecified joint: Secondary | ICD-10-CM | POA: Diagnosis not present

## 2016-03-30 DIAGNOSIS — L4050 Arthropathic psoriasis, unspecified: Secondary | ICD-10-CM | POA: Diagnosis not present

## 2016-03-30 DIAGNOSIS — M0579 Rheumatoid arthritis with rheumatoid factor of multiple sites without organ or systems involvement: Secondary | ICD-10-CM | POA: Diagnosis not present

## 2016-03-30 DIAGNOSIS — Z79899 Other long term (current) drug therapy: Secondary | ICD-10-CM | POA: Diagnosis not present

## 2016-04-02 ENCOUNTER — Ambulatory Visit (INDEPENDENT_AMBULATORY_CARE_PROVIDER_SITE_OTHER): Payer: Medicare Other | Admitting: Psychiatry

## 2016-04-02 ENCOUNTER — Encounter: Payer: Self-pay | Admitting: Psychiatry

## 2016-04-02 VITALS — BP 142/82 | HR 69 | Temp 97.9°F | Ht 66.0 in | Wt 260.2 lb

## 2016-04-02 DIAGNOSIS — I6523 Occlusion and stenosis of bilateral carotid arteries: Secondary | ICD-10-CM | POA: Diagnosis not present

## 2016-04-02 DIAGNOSIS — F331 Major depressive disorder, recurrent, moderate: Secondary | ICD-10-CM

## 2016-04-02 DIAGNOSIS — F411 Generalized anxiety disorder: Secondary | ICD-10-CM

## 2016-04-02 DIAGNOSIS — F419 Anxiety disorder, unspecified: Secondary | ICD-10-CM | POA: Diagnosis not present

## 2016-04-02 MED ORDER — BUSPIRONE HCL 10 MG PO TABS: 20.0000 mg | ORAL_TABLET | Freq: Two times a day (BID) | ORAL | 4 refills | Status: DC

## 2016-04-02 MED ORDER — BUPROPION HCL ER (XL) 150 MG PO TB24: 150.0000 mg | ORAL_TABLET | Freq: Every day | ORAL | 4 refills | Status: DC

## 2016-04-02 MED ORDER — SERTRALINE HCL 100 MG PO TABS: 150.0000 mg | ORAL_TABLET | Freq: Every day | ORAL | 4 refills | Status: DC

## 2016-04-02 NOTE — Progress Notes (Signed)
Patient ID: [name] [name], male   DOB: 01/05/1956, 61 y.o.   MRN: [id number] [facility] MD/PA/NP OP Progress Note  04/02/2016 10:36 AM [name] [name]  MRN:  [id number]  Subjective:  Patient returns for follow-up was generalized anxiety disorder and depression. Patient reports that overall he is doing okay. He has  Lost a brother too over the summer. States that has been hard on him, his brother was to move here. States he died from a massive heart attack.  Patient reports that though this was his half-brother he was close to him and was distraught for a bit. States he is doing better now. He is working on transitioning  his wife's adult son and the family out of their home. Sleeping well and eating okay. He is having some pain from his arthritis but dealing okay. Denies any suicidal thoughts. He enjoys being out in the yard. Enjoys going to church on Sundays.  Chief Complaint: okay Chief Complaint    Follow-up; Medication Refill     Visit Diagnosis:     ICD-9-CM ICD-10-CM   1. GAD (generalized anxiety disorder) 300.02 F41.1   2. Major depressive disorder, recurrent episode, moderate (HCC) 296.32 F33.1     Past Medical History:  Past Medical History:  Diagnosis Date  . Adenomatous colon polyp   . Allergy   . Angina at rest [facility])    chronic  . Anxiety   . Arthritis   . Bell's palsy 2007  . Carotid artery occlusion   . Cataract    Dr. Dawna Part  . Coronary artery disease   . Depression   . Diabetes mellitus   . Diverticulosis   . Duodenitis   . DVT (deep venous thrombosis) (HCC)    in leg  . Gastropathy   . Heart murmur   . Helicobacter pylori gastritis   . Hyperlipidemia   . Hypertension   . Mild aortic stenosis   . Neuropathy ([place])   . Obesity   . Peripheral vascular disease ([place])   . Post splenectomy syndrome   . Psoriasis   . Sleep apnea   . SOB (shortness of breath)   . Stroke ([place])   . Tobacco use disorder    recently quit  . Tubular adenoma 08/2013   Dr. Hilarie Fredrickson  .  Weak urinary stream     Past Surgical History:  Procedure Laterality Date  . CARDIAC CATHETERIZATION  08/2010   LAD: 80% ISR, RCA: 80% ostial, OM 80-90%  . CAROTID ENDARTERECTOMY  08/2010   left/ Dr. Kellie Simmering  . COLONOSCOPY    . CORONARY ANGIOPLASTY     LAD: before CABG  . CORONARY ARTERY BYPASS GRAFT  09/06/2010   At [facility]: LIMA to LAD, left radial to RCA, sequential SVG to OM3 and 4  . heart stents  Jan 2011   leg stents 06/2009 and 03/2010  . PTA of illiac and SFA  multiple   Dr. Ronalee Belts, s/p revision 11/2012  . SPLENECTOMY     Family History:  Family History  Problem Relation Age of Onset  . Lung cancer Father 82  . Breast cancer Sister 75  . Dementia Mother   . Alzheimer's disease Mother   . Diabetes Paternal Grandfather   . Colon polyps Paternal Grandfather 11   Social History:  Social History   Social History  . Marital status: Married    Spouse name: N/A  . Number of children: 0  . Years of education: N/A   Occupational History  .  Retired   Social History Main Topics  . Smoking status: Current Every Day Smoker    Packs/day: 0.75    Years: 40.00    Types: Cigarettes  . Smokeless tobacco: Never Used     Comment: smokes 2-3 cigarettes every 3 weeks  . Alcohol use No  . Drug use: No  . Sexual activity: No   Other Topics Concern  . None   Social History Narrative   Married.      Lives in [place].       Retired   Additional History:   Assessment:   Musculoskeletal: Strength & Muscle Tone: within normal limits Gait & Station: unsteady walks with cane Patient leans: N/A  Psychiatric Specialty Exam: Anxiety  Patient reports no insomnia, nervous/anxious behavior or suicidal ideas.    Depression         Associated symptoms include does not have insomnia and no suicidal ideas.  Past medical history includes anxiety.   Medication Refill     Review of Systems  Psychiatric/Behavioral: Negative.  Negative for depression, hallucinations, memory  loss, substance abuse and suicidal ideas. The patient is not nervous/anxious and does not have insomnia.   All other systems reviewed and are negative.   Blood pressure (!) 142/82, pulse 69, temperature 97.9 F (36.6 C), temperature source Oral, height 5\' 6"  (1.676 m), weight 260 lb 3.2 oz (118 kg).Body mass index is 42 kg/m.  General Appearance: Well Groomed  Eye Contact:  Good  Speech:  Normal Rate  Volume:  Normal  Mood:  Good  Affect:  Congruent  Thought Process:  Normal  Orientation:  Full (Time, Place, and Person)  Thought Content:  Negative  Suicidal Thoughts:  No  Homicidal Thoughts:  No  Memory:  Immediate;   Good Recent;   Good Remote;   Good  Judgement:  Good  Insight:  Good  Psychomotor Activity:  Negative  Concentration:  Good  Recall:  Good  Fund of Knowledge: Good  Language: Good  Akathisia:  Negative  Handed:  Right  AIMS (if indicated):  N/A  Assets:  Communication Skills Desire for Improvement Social Support  ADL's:  Intact  Cognition: WNL  Sleep:  Good    Is the patient at risk to self?  No. Has the patient been a risk to self in the past 6 months?  No. Has the patient been a risk to self within the distant past?  No. Is the patient a risk to others?  No. Has the patient been a risk to others in the past 6 months?  No. Has the patient been a risk to others within the distant past?  No.  Current Medications: Current Outpatient Prescriptions  Medication Sig Dispense Refill  . albuterol (PROAIR HFA) 108 (90 Base) MCG/ACT inhaler Inhale into the lungs.    [name] aspirin 81 MG tablet Take 81 mg by mouth daily.    [name] BAYER MICROLET LANCETS lancets use as directed three times a day Dx: E11.9 100 each 12  . buPROPion (WELLBUTRIN XL) 150 MG 24 hr tablet Take 1 tablet (150 mg total) by mouth daily. 30 tablet 4  . busPIRone (BUSPAR) 10 MG tablet Take 2 tablets (20 mg total) by mouth 2 (two) times daily. 120 tablet 4  . cholecalciferol (VITAMIN D) 1000 UNITS  tablet Take 1,000 Units by mouth daily.      . Cinnamon 500 MG capsule Take 1,000 mg by mouth daily.    . clopidogrel (PLAVIX) 75 MG tablet take  1 tablet by mouth once daily 90 tablet 3  . cyclobenzaprine (FLEXERIL) 10 MG tablet take 1 tablet by mouth three times a day if needed for muscle spasm 30 tablet 0  . finasteride (PROSCAR) 5 MG tablet Take 1 tablet (5 mg total) by mouth daily. 30 tablet 11  . Glucosamine-Chondroit-Vit C-Mn (GLUCOSAMINE CHONDR 500 COMPLEX) CAPS Take 1,000 each by mouth daily.    [name] glucose blood (BAYER CONTOUR TEST) test strip Use to test blood sugar three times daily as instructed.  Dx code: E11.51 100 each 5  . insulin NPH Human (NOVOLIN N RELION) 100 UNIT/ML injection Inject 40 units in am and 20 units in the evening 40 mL 2  . Insulin Pen Needle (NOVOFINE) 32G X 6 MM MISC Use three times daily 100 each 5  . insulin regular (NOVOLIN R,HUMULIN R) 100 units/mL injection Inject 0.1-0.3 mLs (10-30 Units total) into the skin 3 (three) times daily before meals. 40 mL 5  . Insulin Syringe-Needle U-100 (RELION INSULIN SYRINGE 1ML/31G) 31G X 5/16" 1 ML MISC USE THREE TIMES A DAY. 290 each 1  . ketoconazole (NIZORAL) 2 % cream apply to affected area at bedtime  0  . Lancet Device MISC 1 application by Does not apply route as needed. 100 each 11  . losartan (COZAAR) 25 MG tablet take 1 tablet by mouth once daily 90 tablet 3  . magnesium gluconate (MAGONATE) 500 MG tablet Take 1,000 mg by mouth daily.    . metoprolol succinate (TOPROL-XL) 50 MG 24 hr tablet take 1 tablet by mouth twice a day 60 tablet 11  . Multiple Vitamin (MULTIVITAMIN) capsule Take 1 capsule by mouth daily.      . naloxegol oxalate (MOVANTIK) 25 MG TABS tablet Take 1 tablet (25 mg total) by mouth daily. 1 hour before a meal 30 tablet 2  . nitazoxanide (ALINIA) 500 MG tablet Take 1 tablet (500 mg total) by mouth 2 (two) times daily with a meal. 20 tablet 0  . nitroGLYCERIN (NITROSTAT) 0.4 MG SL tablet Place 1  tablet (0.4 mg total) under the tongue every 5 (five) minutes as needed. 25 tablet 1  . Omega-3 Fatty Acids (FISH OIL PO) Take by mouth.    [name] omeprazole (PRILOSEC) 20 MG capsule Take 1 capsule (20 mg total) by mouth 2 (two) times daily. 20 capsule 0  . polyethylene glycol (MIRALAX) packet Take 17 g by mouth daily. 14 each 3  . sertraline (ZOLOFT) 100 MG tablet Take 1.5 tablets (150 mg total) by mouth daily. 45 tablet 4  . simvastatin (ZOCOR) 20 MG tablet take 1 tablet by mouth once daily AT 6PM 90 tablet 3  . tamsulosin (FLOMAX) 0.4 MG CAPS capsule take 2 capsule by mouth daily 60 capsule 11  . topiramate (TOPAMAX) 100 MG tablet Take 1 tablet (100 mg total) by mouth daily. 30 tablet 3  . triamcinolone ointment (KENALOG) 0.1 % apply to affected area twice a day AVOID FACE, GROIN AND UNDERARMS  0   No current facility-administered medications for this visit.     Medical Decision Making:  Established Problem, Stable/Improving (1) and Review of New Medication or Change in Dosage (2)  Treatment Plan Summary:Medication management and Plan   Depressive disorder, moderate, recurrent-stable We will continue the sertraline at 150 mg daily and  Wellbutrin XL 150 mg in the morning. Continue to see therapist, Dr. Cristy Folks.  Generalized anxiety disorder-stable We'll continue his BuSpar to 20 mg twice daily.    Patient  will follow up in 3  months.   Cuthbert Turton 04/02/2016, 10:36 AM

## 2016-04-03 ENCOUNTER — Other Ambulatory Visit: Payer: Self-pay

## 2016-04-03 DIAGNOSIS — I739 Peripheral vascular disease, unspecified: Principal | ICD-10-CM

## 2016-04-03 DIAGNOSIS — I779 Disorder of arteries and arterioles, unspecified: Secondary | ICD-10-CM

## 2016-04-09 ENCOUNTER — Encounter: Payer: Self-pay | Admitting: Cardiovascular Disease

## 2016-04-09 ENCOUNTER — Ambulatory Visit (INDEPENDENT_AMBULATORY_CARE_PROVIDER_SITE_OTHER): Payer: Medicare Other | Admitting: Cardiovascular Disease

## 2016-04-09 VITALS — BP 160/82 | HR 68 | Ht 66.0 in | Wt 261.2 lb

## 2016-04-09 DIAGNOSIS — I6523 Occlusion and stenosis of bilateral carotid arteries: Secondary | ICD-10-CM | POA: Diagnosis not present

## 2016-04-09 DIAGNOSIS — I2510 Atherosclerotic heart disease of native coronary artery without angina pectoris: Secondary | ICD-10-CM

## 2016-04-09 DIAGNOSIS — E785 Hyperlipidemia, unspecified: Secondary | ICD-10-CM | POA: Diagnosis not present

## 2016-04-09 DIAGNOSIS — Z720 Tobacco use: Secondary | ICD-10-CM

## 2016-04-09 DIAGNOSIS — I739 Peripheral vascular disease, unspecified: Secondary | ICD-10-CM

## 2016-04-09 DIAGNOSIS — I350 Nonrheumatic aortic (valve) stenosis: Secondary | ICD-10-CM

## 2016-04-09 DIAGNOSIS — I779 Disorder of arteries and arterioles, unspecified: Secondary | ICD-10-CM

## 2016-04-09 DIAGNOSIS — I10 Essential (primary) hypertension: Secondary | ICD-10-CM

## 2016-04-09 MED ORDER — CARVEDILOL 12.5 MG PO TABS: 12.5000 mg | ORAL_TABLET | Freq: Two times a day (BID) | ORAL | 11 refills | Status: DC

## 2016-04-09 NOTE — Patient Instructions (Addendum)
Medication Instructions:  Your physician has recommended you make the following change in your medication:  STOP taking metoprolol START taking coreg 12.5mg  twice daily   Labwork: none  Testing/Procedures: Your physician has requested that you have an echocardiogram. Echocardiography is a painless test that uses sound waves to create images of your heart. It provides your doctor with information about the size and shape of your heart and how well your heart's chambers and valves are working. This procedure takes approximately one hour. There are no restrictions for this procedure.    Follow-Up: Your physician wants you to follow-up in: one year with Dr. Fletcher Anon.  You will receive a reminder letter in the mail two months in advance. If you don't receive a letter, please call our office to schedule the follow-up appointment.   Any Other Special Instructions Will Be Listed Below (If Applicable).     If you need a refill on your cardiac medications before your next appointment, please call your pharmacy.  Echocardiogram An echocardiogram, or echocardiography, uses sound waves (ultrasound) to produce an image of your heart. The echocardiogram is simple, painless, obtained within a short period of time, and offers valuable information to your health care provider. The images from an echocardiogram can provide information such as:  Evidence of coronary artery disease (CAD).  Heart size.  Heart muscle function.  Heart valve function.  Aneurysm detection.  Evidence of a past heart attack.  Fluid buildup around the heart.  Heart muscle thickening.  Assess heart valve function. LET [facility] CARE PROVIDER KNOW ABOUT:  Any allergies you have.  All medicines you are taking, including vitamins, herbs, eye drops, creams, and over-the-counter medicines.  Previous problems you or members of your family have had with the use of anesthetics.  Any blood disorders you  have.  Previous surgeries you have had.  Medical conditions you have.  Possibility of pregnancy, if this applies. BEFORE THE PROCEDURE  No special preparation is needed. Eat and drink normally.  PROCEDURE   In order to produce an image of your heart, gel will be applied to your chest and a wand-like tool (transducer) will be moved over your chest. The gel will help transmit the sound waves from the transducer. The sound waves will harmlessly bounce off your heart to allow the heart images to be captured in real-time motion. These images will then be recorded.  You may need an IV to receive a medicine that improves the quality of the pictures. AFTER THE PROCEDURE You may return to your normal schedule including diet, activities, and medicines, unless your health care provider tells you otherwise.   This information is not intended to replace advice given to you by your health care provider. Make sure you discuss any questions you have with your health care provider.   Document Released: 07/06/2000 Document Revised: 07/30/2014 Document Reviewed: 03/16/2013 Elsevier Interactive Patient Education [employer].

## 2016-04-09 NOTE — Progress Notes (Signed)
Cardiology Office Note   Date:  04/09/2016   ID:  [name] [name], DOB 08/24/1955, MRN [id number]  PCP:  Mable Paris, FNP  Cardiologist:   [name], MD   Chief Complaint  Patient presents with  . other    F/u carotid US. Meds reviewed verbally with pt.      History of Present Illness: [name] [name] is a 60 y.o. male who presents for A follow-up visit regarding coronary artery disease. He is status post CABG in 2012 for 3 vessel coronary artery disease. He has extensive medical problems that include carotid artery disease status post left carotid endarterectomy , aortic stenosis, tobacco use, obesity, psoriasis, type 2 diabetes, hypertension, hyperlipidemia, mild aortic stenosis and peripheral arterial disease which is being followed by Dr. Ronalee Belts. Most recent stress test was done in of 2013 which showed no evidence of ischemia. Echocardiogram in September 2015 showed normal LV systolic function with mild aortic stenosis.  He has been doing reasonably well and denies any chest pain. He has stable exertional dyspnea. He continues to smoke a few cigarettes a day. He has occasional left arm numbness. He reports that his brother died of myocardial infarction recently at the age of 42. His blood pressure has been running high.   Past Medical History:  Diagnosis Date  . Adenomatous colon polyp   . Allergy   . Angina at rest [facility])    chronic  . Anxiety   . Arthritis   . Bell's palsy 2007  . Carotid artery occlusion   . Cataract    Dr. Dawna Part  . Coronary artery disease   . Depression   . Diabetes mellitus   . Diverticulosis   . Duodenitis   . DVT (deep venous thrombosis) (HCC)    in leg  . Gastropathy   . Heart murmur   . Helicobacter pylori gastritis   . Hyperlipidemia   . Hypertension   . Mild aortic stenosis   . Neuropathy ([place])   . Obesity   . Peripheral vascular disease ([place])   . Post splenectomy syndrome   . Psoriasis   . Sleep apnea   . SOB  (shortness of breath)   . Stroke ([place])   . Tobacco use disorder    recently quit  . Tubular adenoma 08/2013   Dr. Hilarie Fredrickson  . Weak urinary stream     Past Surgical History:  Procedure Laterality Date  . CARDIAC CATHETERIZATION  08/2010   LAD: 80% ISR, RCA: 80% ostial, OM 80-90%  . CAROTID ENDARTERECTOMY  08/2010   left/ Dr. Kellie Simmering  . COLONOSCOPY    . CORONARY ANGIOPLASTY     LAD: before CABG  . CORONARY ARTERY BYPASS GRAFT  09/06/2010   At [facility]: LIMA to LAD, left radial to RCA, sequential SVG to OM3 and 4  . heart stents  Jan 2011   leg stents 06/2009 and 03/2010  . PTA of illiac and SFA  multiple   Dr. Ronalee Belts, s/p revision 11/2012  . SPLENECTOMY       Current Outpatient Prescriptions  Medication Sig Dispense Refill  . albuterol (PROAIR HFA) 108 (90 Base) MCG/ACT inhaler Inhale into the lungs.    [name] aspirin 81 MG tablet Take 81 mg by mouth daily.    [name] BAYER MICROLET LANCETS lancets use as directed three times a day Dx: E11.9 100 each 12  . buPROPion (WELLBUTRIN XL) 150 MG 24 hr tablet Take 1 tablet (150 mg total) by mouth daily. [place]  tablet 4  . busPIRone (BUSPAR) 10 MG tablet Take 2 tablets (20 mg total) by mouth 2 (two) times daily. 120 tablet 4  . cholecalciferol (VITAMIN D) 1000 UNITS tablet Take 1,000 Units by mouth daily.      . Cinnamon 500 MG capsule Take 1,000 mg by mouth daily.    . clopidogrel (PLAVIX) 75 MG tablet take 1 tablet by mouth once daily 90 tablet 3  . cyclobenzaprine (FLEXERIL) 10 MG tablet take 1 tablet by mouth three times a day if needed for muscle spasm 30 tablet 0  . finasteride (PROSCAR) 5 MG tablet Take 1 tablet (5 mg total) by mouth daily. 30 tablet 11  . Glucosamine-Chondroit-Vit C-Mn (GLUCOSAMINE CHONDR 500 COMPLEX) CAPS Take 1,000 each by mouth daily.    [name] glucose blood (BAYER CONTOUR TEST) test strip Use to test blood sugar three times daily as instructed.  Dx code: E11.51 100 each 5  . insulin NPH Human (NOVOLIN N RELION) 100 UNIT/ML injection  Inject 40 units in am and 20 units in the evening 40 mL 2  . Insulin Pen Needle (NOVOFINE) 32G X 6 MM MISC Use three times daily 100 each 5  . insulin regular (NOVOLIN R,HUMULIN R) 100 units/mL injection Inject 0.1-0.3 mLs (10-30 Units total) into the skin 3 (three) times daily before meals. 40 mL 5  . Insulin Syringe-Needle U-100 (RELION INSULIN SYRINGE 1ML/31G) 31G X 5/16" 1 ML MISC USE THREE TIMES A DAY. 290 each 1  . ketoconazole (NIZORAL) 2 % cream apply to affected area at bedtime  0  . Lancet Device MISC 1 application by Does not apply route as needed. 100 each 11  . losartan (COZAAR) 25 MG tablet take 1 tablet by mouth once daily 90 tablet 3  . magnesium gluconate (MAGONATE) 500 MG tablet Take 1,000 mg by mouth daily.    . metoprolol succinate (TOPROL-XL) 50 MG 24 hr tablet take 1 tablet by mouth twice a day 60 tablet 11  . Multiple Vitamin (MULTIVITAMIN) capsule Take 1 capsule by mouth daily.      . naloxegol oxalate (MOVANTIK) 25 MG TABS tablet Take 1 tablet (25 mg total) by mouth daily. 1 hour before a meal 30 tablet 2  . nitazoxanide (ALINIA) 500 MG tablet Take 1 tablet (500 mg total) by mouth 2 (two) times daily with a meal. 20 tablet 0  . nitroGLYCERIN (NITROSTAT) 0.4 MG SL tablet Place 1 tablet (0.4 mg total) under the tongue every 5 (five) minutes as needed. 25 tablet 1  . Omega-3 Fatty Acids (FISH OIL PO) Take by mouth.    [name] omeprazole (PRILOSEC) 20 MG capsule Take 1 capsule (20 mg total) by mouth 2 (two) times daily. 20 capsule 0  . polyethylene glycol (MIRALAX) packet Take 17 g by mouth daily. 14 each 3  . sertraline (ZOLOFT) 100 MG tablet Take 1.5 tablets (150 mg total) by mouth daily. 45 tablet 4  . simvastatin (ZOCOR) 20 MG tablet take 1 tablet by mouth once daily AT 6PM 90 tablet 3  . tamsulosin (FLOMAX) 0.4 MG CAPS capsule take 2 capsule by mouth daily 60 capsule 11  . topiramate (TOPAMAX) 100 MG tablet Take 1 tablet (100 mg total) by mouth daily. 30 tablet 3  .  triamcinolone ointment (KENALOG) 0.1 % apply to affected area twice a day AVOID FACE, GROIN AND UNDERARMS  0   No current facility-administered medications for this visit.     Allergies:   Glipizide; Metrizamide; Penicillins; and Contrast  media [iodinated diagnostic agents]    Social History:  The patient  reports that he has been smoking Cigarettes.  He has a 30.00 pack-year smoking history. He has never used smokeless tobacco. He reports that he does not drink alcohol or use drugs.   Family History:  The patient's family history includes Alzheimer's disease in his mother; Breast cancer (age of onset: 63) in his sister; Colon polyps (age of onset: 24) in his paternal grandfather; Dementia in his mother; Diabetes in his paternal grandfather; Lung cancer (age of onset: 93) in his father.    ROS:  Please see the history of present illness.   Otherwise, review of systems are positive for none.   All other systems are reviewed and negative.    PHYSICAL EXAM: VS:  BP (!) 160/82 (BP Location: Left Arm, Patient Position: Sitting, Cuff Size: Normal)   Pulse 68   Ht 5\' 6"  (1.676 m)   Wt 261 lb 4 oz (118.5 kg)   BMI 42.17 kg/m  , BMI Body mass index is 42.17 kg/m. GEN: Well nourished, well developed, in no acute distress  HEENT: normal  Neck: no JVD, carotid bruits, or masses Cardiac: RRR; no rubs, or gallops,no edema . 3/6 crescendo decrescendo systolic murmur in the aortic area which is mid peaking with diminished S2 Respiratory:  clear to auscultation bilaterally, normal work of breathing GI: soft, nontender, nondistended, + BS MS: no deformity or atrophy  Skin: warm and dry, no rash Neuro:  Strength and sensation are intact Psych: euthymic mood, full affect   EKG:  EKG is not ordered today.    Recent Labs: 10/13/2015: TSH 3.76 12/13/2015: ALT 13; BUN 21; Creatinine, Ser 1.50; Potassium 4.5; Sodium 139    Lipid Panel    Component Value Date/Time   CHOL 118 12/13/2015 0846    TRIG 190.0 (H) 12/13/2015 0846   HDL 24.60 (L) 12/13/2015 0846   CHOLHDL 5 12/13/2015 0846   VLDL 38.0 12/13/2015 0846   LDLCALC 55 12/13/2015 0846   LDLDIRECT 64.9 06/01/2014 1102      Wt Readings from Last 3 Encounters:  04/09/16 261 lb 4 oz (118.5 kg)  04/02/16 260 lb 3.2 oz (118 kg)  03/27/16 258 lb (117 kg)       No flowsheet data found.    ASSESSMENT AND PLAN:  1.  Coronary artery disease involving native coronary arteries without angina: He is overall stable from a cardiac standpoint with no worsening symptoms. Continue medical therapy.  2. Mild aortic stenosis: By physical exam, the aortic stenosis now appears to be at least moderate. I requested a follow-up echocardiogram.  3. Bilateral carotid disease status post left carotid endarterectomy. Carotid Doppler this month showed stable 40-59% right ICA stenosis and mild disease on the left side post CEA. A follow-up carotid Doppler is recommended in one year.  4. Essential hypertension: The pressure has not been well-controlled lately. Thus, I elected to switch him from metoprolol to carvedilol 12.5 mg twice daily.  5. Hyperlipidemia: Most recent LDL was 55 on current dose on simvastatin.    Disposition:   FU with me in 1 year  Signed,  [name], MD  04/09/2016 2:20 PM    [place]

## 2016-04-11 ENCOUNTER — Ambulatory Visit (INDEPENDENT_AMBULATORY_CARE_PROVIDER_SITE_OTHER): Payer: Medicare Other | Admitting: Psychology

## 2016-04-11 DIAGNOSIS — F332 Major depressive disorder, recurrent severe without psychotic features: Secondary | ICD-10-CM

## 2016-04-11 DIAGNOSIS — F449 Dissociative and conversion disorder, unspecified: Secondary | ICD-10-CM

## 2016-04-13 DIAGNOSIS — I10 Essential (primary) hypertension: Secondary | ICD-10-CM | POA: Diagnosis not present

## 2016-04-13 DIAGNOSIS — I70213 Atherosclerosis of native arteries of extremities with intermittent claudication, bilateral legs: Secondary | ICD-10-CM | POA: Diagnosis not present

## 2016-04-13 DIAGNOSIS — E119 Type 2 diabetes mellitus without complications: Secondary | ICD-10-CM | POA: Diagnosis not present

## 2016-04-13 DIAGNOSIS — M79609 Pain in unspecified limb: Secondary | ICD-10-CM | POA: Diagnosis not present

## 2016-04-13 DIAGNOSIS — E785 Hyperlipidemia, unspecified: Secondary | ICD-10-CM | POA: Diagnosis not present

## 2016-04-13 DIAGNOSIS — I739 Peripheral vascular disease, unspecified: Secondary | ICD-10-CM | POA: Diagnosis not present

## 2016-04-13 DIAGNOSIS — I2510 Atherosclerotic heart disease of native coronary artery without angina pectoris: Secondary | ICD-10-CM | POA: Diagnosis not present

## 2016-04-13 DIAGNOSIS — E669 Obesity, unspecified: Secondary | ICD-10-CM | POA: Diagnosis not present

## 2016-04-13 DIAGNOSIS — I6529 Occlusion and stenosis of unspecified carotid artery: Secondary | ICD-10-CM | POA: Diagnosis not present

## 2016-04-13 DIAGNOSIS — E1169 Type 2 diabetes mellitus with other specified complication: Secondary | ICD-10-CM | POA: Diagnosis not present

## 2016-04-18 ENCOUNTER — Ambulatory Visit (INDEPENDENT_AMBULATORY_CARE_PROVIDER_SITE_OTHER): Payer: Medicare Other | Admitting: Psychology

## 2016-04-18 DIAGNOSIS — F449 Dissociative and conversion disorder, unspecified: Secondary | ICD-10-CM | POA: Diagnosis not present

## 2016-04-18 DIAGNOSIS — F332 Major depressive disorder, recurrent severe without psychotic features: Secondary | ICD-10-CM | POA: Diagnosis not present

## 2016-04-25 ENCOUNTER — Ambulatory Visit: Payer: Medicare Other | Admitting: Psychology

## 2016-04-30 DIAGNOSIS — Z79899 Other long term (current) drug therapy: Secondary | ICD-10-CM | POA: Diagnosis not present

## 2016-04-30 DIAGNOSIS — M0579 Rheumatoid arthritis with rheumatoid factor of multiple sites without organ or systems involvement: Secondary | ICD-10-CM | POA: Diagnosis not present

## 2016-05-01 ENCOUNTER — Ambulatory Visit (INDEPENDENT_AMBULATORY_CARE_PROVIDER_SITE_OTHER): Payer: Medicare Other

## 2016-05-01 ENCOUNTER — Other Ambulatory Visit: Payer: Self-pay

## 2016-05-01 DIAGNOSIS — I350 Nonrheumatic aortic (valve) stenosis: Secondary | ICD-10-CM

## 2016-05-02 ENCOUNTER — Ambulatory Visit: Payer: Medicare Other | Admitting: Psychology

## 2016-05-03 ENCOUNTER — Encounter: Payer: Self-pay | Admitting: Family

## 2016-05-04 ENCOUNTER — Other Ambulatory Visit: Payer: Self-pay

## 2016-05-04 DIAGNOSIS — I350 Nonrheumatic aortic (valve) stenosis: Secondary | ICD-10-CM

## 2016-05-04 NOTE — Progress Notes (Unsigned)
[id number]

## 2016-05-09 ENCOUNTER — Ambulatory Visit (INDEPENDENT_AMBULATORY_CARE_PROVIDER_SITE_OTHER): Payer: Medicare Other | Admitting: Psychology

## 2016-05-09 DIAGNOSIS — F332 Major depressive disorder, recurrent severe without psychotic features: Secondary | ICD-10-CM | POA: Diagnosis not present

## 2016-05-09 DIAGNOSIS — F449 Dissociative and conversion disorder, unspecified: Secondary | ICD-10-CM

## 2016-05-15 ENCOUNTER — Other Ambulatory Visit: Payer: Self-pay

## 2016-05-15 ENCOUNTER — Encounter: Payer: Self-pay | Admitting: Internal Medicine

## 2016-05-15 ENCOUNTER — Ambulatory Visit (INDEPENDENT_AMBULATORY_CARE_PROVIDER_SITE_OTHER): Payer: Medicare Other | Admitting: Internal Medicine

## 2016-05-15 VITALS — BP 124/82 | HR 64 | Ht 66.0 in | Wt 259.0 lb

## 2016-05-15 DIAGNOSIS — E1151 Type 2 diabetes mellitus with diabetic peripheral angiopathy without gangrene: Secondary | ICD-10-CM

## 2016-05-15 DIAGNOSIS — I6523 Occlusion and stenosis of bilateral carotid arteries: Secondary | ICD-10-CM

## 2016-05-15 MED ORDER — GLUCOSE BLOOD VI STRP: ORAL_STRIP | 3 refills | Status: DC

## 2016-05-15 NOTE — Patient Instructions (Signed)
Please continue:  Insulin Before breakfast Before lunch Before dinner  Regular - clear (short acting) 20 units - smaller meal 25 units - regular meal 30 units - larger meal  10 units - smaller meal 15 units - regular meal 20 units - larger meal  NPH - cloudy (long acting) 40  20   Please come back for a follow-up appointment in 3 months.

## 2016-05-15 NOTE — Progress Notes (Signed)
Patient ID: [name] [name], male   DOB: 01/04/1956, 60 y.o.   MRN: [id number]  HPI: [name] [name] is a 60 y.o.-year-old male, returning for f/u for DM2, dx 2006, insulin-dependent since 2011, uncontrolled, with complications (CAD - CABG 2012, PVD - s/p Stents, Aortic and carotid stenosis, peripheral neuropathy). Last visit 3 mo ago.  Last hemoglobin A1c: Lab Results  Component Value Date   HGBA1C 6.2 03/06/2016   HGBA1C 6.5 12/13/2015   HGBA1C 6.2 09/19/2015   We stopped Victoza at a previous visit. He could not afford his insulins (doughnut hole) - 400$.   He is now on: N and R insulins - decreased 04/2015 b/c low CBGs:  Insulin Before breakfast Before lunch Before dinner  Regular - clear (short acting) 20 units - smaller meal 25 units - regular meal 30 units - larger meal  10 units - smaller meal 15 units - regular meal 20 units - larger meal  NPH - cloudy (long acting) 40  20   Pt checks his sugars 3x a day and they are (reviewed log): - am: 88-166, 196, 309 >> 89-154, 193 >> 69, 82-143 >> 78, 86-145 >> 80-139 >> 81-131 - 2h after b'fast: 139-148 >> n/c >> 161 >> n/c >> 138 >> 219 >> 137-162, 198 >> 145-158 - before lunch: 70, 79, 123-167, 220 >> 133, 179 >> 119-158 >> n/c >> 152-180, 226 >> n/c - 2h after lunch: 93-179, 217 >> 101-165, 187 >> 94-170, 196 >> 82-167, 215 >> 85-177 - before dinner: n/c >> 101-192 >> 122-147 >> 69-137 >> 175 >> n/c >> n/c - 2h after dinner:39 x1; 100-143 >> 98-143, 197, 210 >> 115-189, 242 >> 89-182, 251 >> 94-201, 225 - bedtime: 106-129 >> 80-135 >> 101-154 >> 79-134 >> 89-134 >> 109-139 >> n/c >> 130, 169 No lows. Lowest sugar was 80 >> 50 x1 - before lunch, 69 x1 - after lunch; ? hypoglycemia awareness. Highest sugar was 226 >> 242  Pt's meals are: - Breakfast: toast + eggs or cereals + milk - Lunch: soup + 1/2 sandwich - Dinner: meat + sweet potatoes or pasta + salad - Snacks: 3: sugar free foods, fruit He quit drinking sodas, but  drinks tea - artificial sweetener. He also started to drink Almond milk. Exercises by walking 2x a day.  - He has CKD, last BUN/creatinine:  Lab Results  Component Value Date   BUN 21 12/13/2015   CREATININE 1.50 12/13/2015  He is on Cozaar. Sees nephrology. Has a stone in L kidney, has 3 cysts in R kidney. - last set of lipids: Lab Results  Component Value Date   CHOL 118 12/13/2015   HDL 24.60 (L) 12/13/2015   LDLCALC 55 12/13/2015   LDLDIRECT 64.9 06/01/2014   TRIG 190.0 (H) 12/13/2015   CHOLHDL 5 12/13/2015  He  Is on Zocor. - last eye exam was in 11/2015 [facility] DR. + cataracts. - + numbness and tingling in his feet - [name] [name]) - Dr Elvina Mattes.   He has pain in L leg >> surgery for PVD stents on 04/27/2014. His sugars improved after the stent.   I reviewed pt's medications, allergies, PMH, social hx, family hx, and changes were documented in the history of present illness. Otherwise, unchanged from my initial visit note.   ROS: Constitutional:  + weight loss, no fatigue, no subjective hyperthermia  Eyes: no blurry vision, no xerophthalmia ENT: no sore throat, no nodules palpated in throat, + dysphagia/no  odynophagia, no hoarseness Cardiovascular: no CP/+ palpitations/no leg swelling Respiratory: no cough/+ SOB Gastrointestinal: no N/V/D/C Musculoskeletal: no muscle/joint aches Skin: + rash - face Neurological: no tremors/numbness/tingling/dizziness, no HA  PE: BP 124/82 (BP Location: Left Arm, Patient Position: Sitting)   Pulse 64   Ht 5\' 6"  (1.676 m)   Wt 259 lb (117.5 kg)   BMI 41.80 kg/m  Body mass index is 41.8 kg/m. Wt Readings from Last 3 Encounters:  05/15/16 259 lb (117.5 kg)  04/09/16 261 lb 4 oz (118.5 kg)  04/02/16 260 lb 3.2 oz (118 kg)   Constitutional: obese, in NAD, ruddy complexion Eyes: PERRLA, EOMI, no exophthalmos ENT: dry mucous membranes, no thyromegaly, no cervical lymphadenopathy Cardiovascular: RRR, +3/6 SEM,  no RG Respiratory: CTA B Gastrointestinal: abdomen soft, NT, ND, BS+ Musculoskeletal: no deformities, strength intact in all 4 Skin: moist, warm, + rash - rosacea on face  Neurological: no tremor with outstretched hands, DTR normal in all 4  ASSESSMENT: 1. DM2, insulin-dependent, now controlled, with complications - CAD - s/p CABG 2012 - PVD - s/p stents 2012 - Aortic stenosis - carotid stenosis - CKD - peripheral neuropathy  - He read Dr Janene Harvey book: Program for Reversing Diabetes".  PLAN:  1. Patient with long-standing, now controlled diabetes, on injectable regimen. Diabetes has improved lately (last HbA1c was 6.2% at last check!, but it has been <7% this whole past year) >> no more severe lows (had 1 x 50 only since last visit) and only few highs. - will not change regimen - I advised him to Patient Instructions  Please continue:  Insulin Before breakfast Before lunch Before dinner  Regular - clear (short acting) 20 units - smaller meal 25 units - regular meal 30 units - larger meal  10 units - smaller meal 15 units - regular meal 20 units - larger meal  NPH - cloudy (long acting) 40  20   Please come back for a follow-up appointment in 3 months.  - continue to check 3 times a day, rotating checks - up to date with eye exams - Return to clinic in 3 mo with sugar log   Philemon Kingdom, MD PhD [facility] Endocrinology

## 2016-05-16 ENCOUNTER — Ambulatory Visit (INDEPENDENT_AMBULATORY_CARE_PROVIDER_SITE_OTHER): Payer: Medicare Other | Admitting: Psychology

## 2016-05-16 DIAGNOSIS — F332 Major depressive disorder, recurrent severe without psychotic features: Secondary | ICD-10-CM | POA: Diagnosis not present

## 2016-05-16 DIAGNOSIS — F449 Dissociative and conversion disorder, unspecified: Secondary | ICD-10-CM

## 2016-05-21 DIAGNOSIS — B351 Tinea unguium: Secondary | ICD-10-CM | POA: Diagnosis not present

## 2016-05-21 DIAGNOSIS — E1142 Type 2 diabetes mellitus with diabetic polyneuropathy: Secondary | ICD-10-CM | POA: Diagnosis not present

## 2016-05-23 ENCOUNTER — Ambulatory Visit (INDEPENDENT_AMBULATORY_CARE_PROVIDER_SITE_OTHER): Payer: Medicare Other | Admitting: Psychology

## 2016-05-23 DIAGNOSIS — F449 Dissociative and conversion disorder, unspecified: Secondary | ICD-10-CM | POA: Diagnosis not present

## 2016-05-23 DIAGNOSIS — F332 Major depressive disorder, recurrent severe without psychotic features: Secondary | ICD-10-CM | POA: Diagnosis not present

## 2016-05-24 DIAGNOSIS — H34212 Partial retinal artery occlusion, left eye: Secondary | ICD-10-CM | POA: Diagnosis not present

## 2016-05-24 LAB — HM DIABETES EYE EXAM

## 2016-05-29 DIAGNOSIS — R809 Proteinuria, unspecified: Secondary | ICD-10-CM | POA: Diagnosis not present

## 2016-05-29 DIAGNOSIS — N183 Chronic kidney disease, stage 3 (moderate): Secondary | ICD-10-CM | POA: Diagnosis not present

## 2016-05-29 DIAGNOSIS — I129 Hypertensive chronic kidney disease with stage 1 through stage 4 chronic kidney disease, or unspecified chronic kidney disease: Secondary | ICD-10-CM | POA: Diagnosis not present

## 2016-05-29 DIAGNOSIS — E1122 Type 2 diabetes mellitus with diabetic chronic kidney disease: Secondary | ICD-10-CM | POA: Diagnosis not present

## 2016-05-30 ENCOUNTER — Ambulatory Visit (INDEPENDENT_AMBULATORY_CARE_PROVIDER_SITE_OTHER): Payer: Medicare Other | Admitting: Psychology

## 2016-05-30 DIAGNOSIS — F4310 Post-traumatic stress disorder, unspecified: Secondary | ICD-10-CM

## 2016-06-02 ENCOUNTER — Other Ambulatory Visit: Payer: Self-pay | Admitting: Internal Medicine

## 2016-06-06 ENCOUNTER — Ambulatory Visit: Payer: Medicare Other | Admitting: Psychology

## 2016-06-11 ENCOUNTER — Encounter: Payer: Self-pay | Admitting: Family

## 2016-06-11 ENCOUNTER — Ambulatory Visit (INDEPENDENT_AMBULATORY_CARE_PROVIDER_SITE_OTHER): Payer: Medicare Other | Admitting: Family

## 2016-06-11 VITALS — BP 128/82 | HR 73 | Temp 98.3°F | Ht 66.0 in | Wt 263.4 lb

## 2016-06-11 DIAGNOSIS — M545 Low back pain, unspecified: Secondary | ICD-10-CM

## 2016-06-11 DIAGNOSIS — I350 Nonrheumatic aortic (valve) stenosis: Secondary | ICD-10-CM

## 2016-06-11 DIAGNOSIS — E1151 Type 2 diabetes mellitus with diabetic peripheral angiopathy without gangrene: Secondary | ICD-10-CM | POA: Diagnosis not present

## 2016-06-11 DIAGNOSIS — I10 Essential (primary) hypertension: Secondary | ICD-10-CM

## 2016-06-11 DIAGNOSIS — I6523 Occlusion and stenosis of bilateral carotid arteries: Secondary | ICD-10-CM | POA: Diagnosis not present

## 2016-06-11 LAB — HEMOGLOBIN A1C: Hgb A1c MFr Bld: 5.9 % (ref 4.6–6.5)

## 2016-06-11 NOTE — Assessment & Plan Note (Signed)
Acute on chronic. Exacerbated by fall. Agreed on conservative therapy. Return precautions given.

## 2016-06-11 NOTE — Assessment & Plan Note (Signed)
Controlled. Pending a1c

## 2016-06-11 NOTE — Assessment & Plan Note (Signed)
Symptomatically stable. Follows with Dr Fletcher Anon

## 2016-06-11 NOTE — Patient Instructions (Addendum)
Pleasure seeing you.   Let's treat conservatively as we discussed.   Over-the-counter medications you may try for arthritic pain include:   ThermaCare patches   Capsaicin cream   Icy hot   If conservative treatment doesn't yield results, we will consider physical therapy, consult to Sports Medicine/Orthopedics for further evaluation, and imaging.   If there is no improvement in your symptoms, or if there is any worsening of symptoms, or if you have any additional concerns, please return for re-evaluation; or, if we are closed, consider going to the Emergency Room for evaluation if symptoms urgent.

## 2016-06-11 NOTE — Progress Notes (Signed)
Pre visit review using our clinic review tool, if applicable. No additional management support is needed unless otherwise documented below in the visit note. 

## 2016-06-11 NOTE — Assessment & Plan Note (Signed)
At goal.  Continue regimen. 

## 2016-06-11 NOTE — Progress Notes (Signed)
Subjective:    Patient ID: [name] [name], male    DOB: 1955/07/28, 60 y.o.   MRN: [id number]  CC: [name] [name] is a 60 y.o. male who presents today for follow up.   HPI: Patient for follow-up.   Reports a fall 5 days ago and hit low back while walking in parking lot. Ache bilateral low back. Didn't head or LOC. Believes neuropathy caused pain. Tried old narcotics with some relief. Had similar fall 3 years ago.   DM- Following diabetic diet. Follows with Dr Shellia Cleverly, endocrine. Takes 15-20 units SSI and 15-20 units NPH. No hypoglycemia. Reports DM neuropathy, takes flexeril PRN with relief. Tried gabapentin in the past however believes it caused A1c raised.   Hypertension- Compliant with medications. Folllows with Dr Fletcher Anon. Denies exertional chest pain or pressure, numbness or tingling radiating to left arm or jaw, palpitations, dizziness, frequent headaches, changes in vision, or shortness of breath.      HISTORY:  Past Medical History:  Diagnosis Date  . Adenomatous colon polyp   . Allergy   . Angina at rest [facility])    chronic  . Anxiety   . Arthritis   . Bell's palsy 2007  . Carotid artery occlusion   . Cataract    Dr. Dawna Part  . Coronary artery disease   . Depression   . Diabetes mellitus   . Diverticulosis   . Duodenitis   . DVT (deep venous thrombosis) (HCC)    in leg  . Gastropathy   . Heart murmur   . Helicobacter pylori gastritis   . Hyperlipidemia   . Hypertension   . Mild aortic stenosis   . Neuropathy ([place])   . Obesity   . Peripheral vascular disease ([place])   . Post splenectomy syndrome   . Psoriasis   . Sleep apnea   . SOB (shortness of breath)   . Stroke ([place])   . Tobacco use disorder    recently quit  . Tubular adenoma 08/2013   Dr. Hilarie Fredrickson  . Weak urinary stream    Past Surgical History:  Procedure Laterality Date  . CARDIAC CATHETERIZATION  08/2010   LAD: 80% ISR, RCA: 80% ostial, OM 80-90%  . CAROTID ENDARTERECTOMY  08/2010   left/ Dr.  Kellie Simmering  . COLONOSCOPY    . CORONARY ANGIOPLASTY     LAD: before CABG  . CORONARY ARTERY BYPASS GRAFT  09/06/2010   At [facility]: LIMA to LAD, left radial to RCA, sequential SVG to OM3 and 4  . heart stents  Jan 2011   leg stents 06/2009 and 03/2010  . PTA of illiac and SFA  multiple   Dr. Ronalee Belts, s/p revision 11/2012  . SPLENECTOMY     Family History  Problem Relation Age of Onset  . Lung cancer Father 46  . Breast cancer Sister 55  . Dementia Mother   . Alzheimer's disease Mother   . Diabetes Paternal Grandfather   . Colon polyps Paternal Grandfather 40    Allergies: Glipizide; Metrizamide; Penicillins; and Contrast media [iodinated diagnostic agents] Current Outpatient Prescriptions on File Prior to Visit  Medication Sig Dispense Refill  . albuterol (PROAIR HFA) 108 (90 Base) MCG/ACT inhaler Inhale into the lungs.    [name] aspirin 81 MG tablet Take 81 mg by mouth daily.    [name] BAYER MICROLET LANCETS lancets use as directed three times a day Dx: E11.9 100 each 12  . buPROPion (WELLBUTRIN XL) 150 MG 24 hr tablet Take 1 tablet (150  mg total) by mouth daily. 30 tablet 4  . busPIRone (BUSPAR) 10 MG tablet Take 2 tablets (20 mg total) by mouth 2 (two) times daily. 120 tablet 4  . carvedilol (COREG) 12.5 MG tablet Take 1 tablet (12.5 mg total) by mouth 2 (two) times daily. 60 tablet 11  . cholecalciferol (VITAMIN D) 1000 UNITS tablet Take 1,000 Units by mouth daily.      . Cinnamon 500 MG capsule Take 1,000 mg by mouth daily.    . clopidogrel (PLAVIX) 75 MG tablet take 1 tablet by mouth once daily 90 tablet 3  . cyclobenzaprine (FLEXERIL) 10 MG tablet take 1 tablet by mouth three times a day if needed for muscle spasm 30 tablet 0  . finasteride (PROSCAR) 5 MG tablet Take 1 tablet (5 mg total) by mouth daily. 30 tablet 11  . Glucosamine-Chondroit-Vit C-Mn (GLUCOSAMINE CHONDR 500 COMPLEX) CAPS Take 1,000 each by mouth daily.    [name] glucose blood (BAYER CONTOUR TEST) test strip Use to test blood sugar  three times daily as instructed.  Dx code: E11.51 300 each 3  . insulin NPH Human (NOVOLIN N RELION) 100 UNIT/ML injection Inject 40 units in am and 20 units in the evening 40 mL 2  . Insulin Pen Needle (NOVOFINE) 32G X 6 MM MISC Use three times daily 100 each 5  . insulin regular (NOVOLIN R,HUMULIN R) 100 units/mL injection Inject 0.1-0.3 mLs (10-30 Units total) into the skin 3 (three) times daily before meals. 40 mL 5  . Insulin Syringe-Needle U-100 (RELION INSULIN SYRINGE 1ML/31G) 31G X 5/16" 1 ML MISC USE THREE TIMES A DAY. 290 each 1  . ketoconazole (NIZORAL) 2 % cream apply to affected area at bedtime  0  . Lancet Device MISC 1 application by Does not apply route as needed. 100 each 11  . losartan (COZAAR) 25 MG tablet take 1 tablet by mouth once daily 90 tablet 3  . magnesium gluconate (MAGONATE) 500 MG tablet Take 1,000 mg by mouth daily.    . Multiple Vitamin (MULTIVITAMIN) capsule Take 1 capsule by mouth daily.      . naloxegol oxalate (MOVANTIK) 25 MG TABS tablet Take 1 tablet (25 mg total) by mouth daily. 1 hour before a meal 30 tablet 2  . nitazoxanide (ALINIA) 500 MG tablet Take 1 tablet (500 mg total) by mouth 2 (two) times daily with a meal. 20 tablet 0  . nitroGLYCERIN (NITROSTAT) 0.4 MG SL tablet Place 1 tablet (0.4 mg total) under the tongue every 5 (five) minutes as needed. 25 tablet 1  . Omega-3 Fatty Acids (FISH OIL PO) Take by mouth.    [name] omeprazole (PRILOSEC) 20 MG capsule Take 1 capsule (20 mg total) by mouth 2 (two) times daily. 20 capsule 0  . polyethylene glycol (MIRALAX) packet Take 17 g by mouth daily. 14 each 3  . sertraline (ZOLOFT) 100 MG tablet Take 1.5 tablets (150 mg total) by mouth daily. 45 tablet 4  . simvastatin (ZOCOR) 20 MG tablet take 1 tablet by mouth once daily AT 6PM 90 tablet 3  . tamsulosin (FLOMAX) 0.4 MG CAPS capsule take 2 capsule by mouth daily 60 capsule 11  . triamcinolone ointment (KENALOG) 0.1 % apply to affected area twice a day AVOID FACE,  GROIN AND UNDERARMS  0  . ULTICARE INSULIN SYRINGE 31G X 5/16" 1 ML MISC USE ONE  THREE TIMES DAILY 200 each 2   No current facility-administered medications on file prior to visit.  Social History  Substance Use Topics  . Smoking status: Current Every Day Smoker    Packs/day: 0.75    Years: 40.00    Types: Cigarettes  . Smokeless tobacco: Never Used     Comment: smokes 2-3 cigarettes every 3 weeks  . Alcohol use No    Review of Systems  Constitutional: Negative for chills and fever.  Eyes: Negative for visual disturbance.  Respiratory: Negative for cough.   Cardiovascular: Negative for chest pain and palpitations.  Gastrointestinal: Negative for nausea and vomiting.  Genitourinary: Negative for dysuria.  Musculoskeletal: Positive for back pain.      Objective:    BP 128/82   Pulse 73   Temp 98.3 F (36.8 C) (Oral)   Ht 5\' 6"  (1.676 m)   Wt 263 lb 6.4 oz (119.5 kg)   SpO2 94%   BMI 42.51 kg/m  BP Readings from Last 3 Encounters:  06/11/16 128/82  05/15/16 124/82  04/09/16 (!) 160/82   Wt Readings from Last 3 Encounters:  06/11/16 263 lb 6.4 oz (119.5 kg)  05/15/16 259 lb (117.5 kg)  04/09/16 261 lb 4 oz (118.5 kg)    Physical Exam  Constitutional: He appears well-developed and well-nourished.  Cardiovascular: Regular rhythm and normal heart sounds.   Pulmonary/Chest: Effort normal and breath sounds normal. No respiratory distress. He has no wheezes. He has no rhonchi. He has no rales.  Musculoskeletal:       Lumbar back: He exhibits normal range of motion, no tenderness, no bony tenderness, no swelling, no pain and no spasm.  Full range of motion with flexion, extension, lateral side bends. No pain, numbness, tingling elicited with single leg raise bilaterally. No rash.  Neurological: He is alert.  Skin: Skin is warm and dry.  Psychiatric: He has a normal mood and affect. His speech is normal and behavior is normal.  Vitals reviewed.      Assessment &  Plan:   Problem List Items Addressed This Visit      Cardiovascular and Mediastinum   Hypertension - Primary (Chronic)    At goal. Continue regimen.      Moderate aortic stenosis    Symptomatically stable. Follows with Dr Fletcher Anon        Endocrine   DM (diabetes mellitus), type 2 with peripheral vascular complications (HCC) (Chronic)    Controlled. Pending a1c      Relevant Orders   Hemoglobin A1c     Other   Lumbar back pain    Acute on chronic. Exacerbated by fall. Agreed on conservative therapy. Return precautions given.           I have discontinued Mr. Indelicato topiramate. I am also having him maintain his cholecalciferol, multivitamin, nitroGLYCERIN, Cinnamon, magnesium gluconate, GLUCOSAMINE CHONDR 500 COMPLEX, nitazoxanide, omeprazole, aspirin, polyethylene glycol, Insulin Pen Needle, losartan, Omega-3 Fatty Acids (FISH OIL PO), naloxegol oxalate, ketoconazole, triamcinolone ointment, clopidogrel, tamsulosin, finasteride, albuterol, Insulin Syringe-Needle U-100, insulin NPH Human, insulin regular, simvastatin, Lancet Device, BAYER MICROLET LANCETS, cyclobenzaprine, busPIRone, buPROPion, sertraline, carvedilol, glucose blood, and ULTICARE INSULIN SYRINGE.   No orders of the defined types were placed in this encounter.   Return precautions given.   Risks, benefits, and alternatives of the medications and treatment plan prescribed today were discussed, and patient expressed understanding.   Education regarding symptom management and diagnosis given to patient on AVS.  Continue to follow with Mable Paris, FNP for routine health maintenance.   [name] [name] and I agreed with plan.  Mable Paris, FNP

## 2016-06-13 ENCOUNTER — Ambulatory Visit: Payer: Medicare Other | Admitting: Psychology

## 2016-06-18 ENCOUNTER — Ambulatory Visit (INDEPENDENT_AMBULATORY_CARE_PROVIDER_SITE_OTHER): Payer: Medicare Other | Admitting: Urology

## 2016-06-18 ENCOUNTER — Encounter: Payer: Self-pay | Admitting: Urology

## 2016-06-18 VITALS — BP 179/80 | Ht 66.0 in | Wt 256.2 lb

## 2016-06-18 DIAGNOSIS — R3912 Poor urinary stream: Secondary | ICD-10-CM | POA: Diagnosis not present

## 2016-06-18 DIAGNOSIS — N401 Enlarged prostate with lower urinary tract symptoms: Secondary | ICD-10-CM | POA: Diagnosis not present

## 2016-06-18 DIAGNOSIS — I6523 Occlusion and stenosis of bilateral carotid arteries: Secondary | ICD-10-CM | POA: Diagnosis not present

## 2016-06-18 DIAGNOSIS — N138 Other obstructive and reflux uropathy: Secondary | ICD-10-CM

## 2016-06-18 LAB — BLADDER SCAN AMB NON-IMAGING: Scan Result: 109

## 2016-06-18 MED ORDER — TAMSULOSIN HCL 0.4 MG PO CAPS: ORAL_CAPSULE | ORAL | 11 refills | Status: DC

## 2016-06-18 NOTE — Progress Notes (Signed)
06/18/2016 9:24 AM   [name] [name] 29-Jul-1955 [id number]  Referring provider: Burnard Hawthorne, FNP [address] [place], [place] [address]  Chief Complaint  Patient presents with  . Uroflow    HPI: Patient returns in continuing management of BPH. Here for a yearly check. He's been on been on tamsulosin and finasteride. His PVR today is 109 mL. He did not void before the scan. He was set for a uroflow but did not feel the need to void. His August 2017 PSA was 0.05. He has no voiding complaints. No dysuria or gross hematuria.    PMH: Past Medical History:  Diagnosis Date  . Adenomatous colon polyp   . Allergy   . Angina at rest [facility])    chronic  . Anxiety   . Arthritis   . Bell's palsy 2007  . Carotid artery occlusion   . Cataract    Dr. Dawna Part  . Coronary artery disease   . Depression   . Diabetes mellitus   . Diverticulosis   . Duodenitis   . DVT (deep venous thrombosis) (HCC)    in leg  . Gastropathy   . Heart murmur   . Helicobacter pylori gastritis   . Hyperlipidemia   . Hypertension   . Mild aortic stenosis   . Neuropathy ([place])   . Obesity   . Peripheral vascular disease ([place])   . Post splenectomy syndrome   . Psoriasis   . Sleep apnea   . SOB (shortness of breath)   . Stroke ([place])   . Tobacco use disorder    recently quit  . Tubular adenoma 08/2013   Dr. Hilarie Fredrickson  . Weak urinary stream     Surgical History: Past Surgical History:  Procedure Laterality Date  . CARDIAC CATHETERIZATION  08/2010   LAD: 80% ISR, RCA: 80% ostial, OM 80-90%  . CAROTID ENDARTERECTOMY  08/2010   left/ Dr. Kellie Simmering  . COLONOSCOPY    . CORONARY ANGIOPLASTY     LAD: before CABG  . CORONARY ARTERY BYPASS GRAFT  09/06/2010   At [facility]: LIMA to LAD, left radial to RCA, sequential SVG to OM3 and 4  . heart stents  Jan 2011   leg stents 06/2009 and 03/2010  . PTA of illiac and SFA  multiple   Dr. Ronalee Belts, s/p revision 11/2012  . SPLENECTOMY      Home  Medications:    Medication List       Accurate as of 06/18/16  9:24 AM. Always use your most recent med list.          aspirin 81 MG tablet Take 81 mg by mouth daily.   BAYER MICROLET LANCETS lancets use as directed three times a day Dx: E11.9   buPROPion 150 MG 24 hr tablet Commonly known as:  WELLBUTRIN XL Take 1 tablet (150 mg total) by mouth daily.   busPIRone 10 MG tablet Commonly known as:  BUSPAR Take 2 tablets (20 mg total) by mouth 2 (two) times daily.   carvedilol 12.5 MG tablet Commonly known as:  COREG Take 1 tablet (12.5 mg total) by mouth 2 (two) times daily.   cholecalciferol 1000 units tablet Commonly known as:  VITAMIN D Take 1,000 Units by mouth daily.   Cinnamon 500 MG capsule Take 1,000 mg by mouth daily.   clopidogrel 75 MG tablet Commonly known as:  PLAVIX take 1 tablet by mouth once daily   cyclobenzaprine 10 MG tablet Commonly known as:  FLEXERIL  take 1 tablet by mouth three times a day if needed for muscle spasm   finasteride 5 MG tablet Commonly known as:  PROSCAR Take 1 tablet (5 mg total) by mouth daily.   FISH OIL PO Take by mouth.   GLUCOSAMINE CHONDR 500 COMPLEX Caps Take 1,000 each by mouth daily.   glucose blood test strip Commonly known as:  BAYER CONTOUR TEST Use to test blood sugar three times daily as instructed.  Dx code: E11.51   insulin NPH Human 100 UNIT/ML injection Commonly known as:  NOVOLIN N RELION Inject 40 units in am and 20 units in the evening   Insulin Pen Needle 32G X 6 MM Misc Commonly known as:  NOVOFINE Use three times daily   insulin regular 250 units/2.57mL (100 units/mL) injection Commonly known as:  NOVOLIN R,HUMULIN R Inject 0.1-0.3 mLs (10-30 Units total) into the skin 3 (three) times daily before meals.   Insulin Syringe-Needle U-100 31G X 5/16" 1 ML Misc Commonly known as:  RELION INSULIN SYRINGE 1ML/31G USE THREE TIMES A DAY.   ULTICARE INSULIN SYRINGE 31G X 5/16" 1 ML Misc Generic  drug:  Insulin Syringe-Needle U-100 USE ONE  THREE TIMES DAILY   ketoconazole 2 % cream Commonly known as:  NIZORAL apply to affected area at bedtime   Lancet Device Misc 1 application by Does not apply route as needed.   losartan 25 MG tablet Commonly known as:  COZAAR take 1 tablet by mouth once daily   magnesium gluconate 500 MG tablet Commonly known as:  MAGONATE Take 1,000 mg by mouth daily.   multivitamin capsule Take 1 capsule by mouth daily.   naloxegol oxalate 25 MG Tabs tablet Commonly known as:  MOVANTIK Take 1 tablet (25 mg total) by mouth daily. 1 hour before a meal   nitazoxanide 500 MG tablet Commonly known as:  ALINIA Take 1 tablet (500 mg total) by mouth 2 (two) times daily with a meal.   nitroGLYCERIN 0.4 MG SL tablet Commonly known as:  NITROSTAT Place 1 tablet (0.4 mg total) under the tongue every 5 (five) minutes as needed.   omeprazole 20 MG capsule Commonly known as:  PRILOSEC Take 1 capsule (20 mg total) by mouth 2 (two) times daily.   polyethylene glycol packet Commonly known as:  MIRALAX Take 17 g by mouth daily.   PROAIR HFA 108 (90 Base) MCG/ACT inhaler Generic drug:  albuterol Inhale into the lungs.   sertraline 100 MG tablet Commonly known as:  ZOLOFT Take 1.5 tablets (150 mg total) by mouth daily.   simvastatin 20 MG tablet Commonly known as:  ZOCOR take 1 tablet by mouth once daily AT 6PM   tamsulosin 0.4 MG Caps capsule Commonly known as:  FLOMAX take 2 capsule by mouth daily   triamcinolone ointment 0.1 % Commonly known as:  KENALOG apply to affected area twice a day AVOID FACE, GROIN AND UNDERARMS       Allergies:  Allergies  Allergen Reactions  . Glipizide Other (See Comments)    ANTIDIABETICS. Burning  . Metrizamide     Got very hot and red  . Penicillins Hives and Swelling  . Contrast Media [Iodinated Diagnostic Agents] Rash and Other (See Comments)    Got very hot and red Got very hot and red    Family  History: Family History  Problem Relation Age of Onset  . Lung cancer Father 35  . Breast cancer Sister 35  . Dementia Mother   . Alzheimer's  disease Mother   . Diabetes Paternal Grandfather   . Colon polyps Paternal Grandfather 45    Social History:  reports that he has been smoking Cigarettes.  He has a 30.00 pack-year smoking history. He has never used smokeless tobacco. He reports that he does not drink alcohol or use drugs.  ROS: UROLOGY Frequent Urination?: No Hard to postpone urination?: No Burning/pain with urination?: No Get up at night to urinate?: No Leakage of urine?: No Urine stream starts and stops?: No Trouble starting stream?: No Do you have to strain to urinate?: No Blood in urine?: No Urinary tract infection?: No Sexually transmitted disease?: No Injury to kidneys or bladder?: No Painful intercourse?: No Weak stream?: No Erection problems?: No Penile pain?: No  Gastrointestinal Nausea?: No Vomiting?: No Indigestion/heartburn?: No Diarrhea?: No Constipation?: No  Constitutional Fever: No Night sweats?: No Weight loss?: No Fatigue?: No  Skin Skin rash/lesions?: No Itching?: No  Eyes Blurred vision?: No Double vision?: No  Ears/Nose/Throat Sore throat?: No Sinus problems?: Yes  Hematologic/Lymphatic Swollen glands?: No Easy bruising?: No  Cardiovascular Leg swelling?: No Chest pain?: No  Respiratory Cough?: No Shortness of breath?: No  Endocrine Excessive thirst?: No  Musculoskeletal Back pain?: No Joint pain?: Yes  Neurological Headaches?: No Dizziness?: No  Psychologic Depression?: Yes Anxiety?: Yes  Physical Exam: BP (!) 179/80 (BP Location: Left Arm, Patient Position: Sitting, Cuff Size: Large)   Ht 5\' 6"  (1.676 m)   Wt 116.2 kg (256 lb 3.2 oz)   BMI 41.35 kg/m   Constitutional:  Alert and oriented, No acute distress. HEENT: [place] AT, moist mucus membranes.  Trachea midline, no masses. Cardiovascular: No  clubbing, cyanosis, or edema. Respiratory: Normal respiratory effort, no increased work of breathing. GI: Abdomen is soft, nontender, nondistended, no abdominal masses GU: No CVA tenderness. Skin: No rashes, bruises or suspicious lesions. Lymph: No cervical or inguinal adenopathy. Neurologic: Grossly intact, no focal deficits, moving all 4 extremities. Psychiatric: Normal mood and affect. DRE: prostate small, smooth, benign - no hard area or nodule   Laboratory Data: Lab Results  Component Value Date   WBC 9.6 12/21/2014   HGB 12.8 (L) 12/21/2014   HCT 39.3 12/21/2014   MCV 87.9 12/21/2014   PLT 236 12/21/2014    Lab Results  Component Value Date   CREATININE 1.50 12/13/2015    Lab Results  Component Value Date   PSA 0.05 (L) 03/06/2016   PSA 0.06 (L) 09/19/2015   PSA 0.18 11/23/2013    No results found for: TESTOSTERONE  Lab Results  Component Value Date   HGBA1C 5.9 06/11/2016    Urinalysis    Component Value Date/Time   COLORURINE YELLOW 12/21/2014 1336   APPEARANCEUR Clear 06/13/2015 1012   LABSPEC 1.015 12/21/2014 1336   PHURINE 5.0 12/21/2014 1336   GLUCOSEU Negative 06/13/2015 1012   HGBUR NEGATIVE 12/21/2014 1336   BILIRUBINUR Negative 06/13/2015 1012   KETONESUR NEGATIVE 12/21/2014 1336   PROTEINUR 2+ (A) 06/13/2015 1012   PROTEINUR 30 (A) 12/21/2014 1336   UROBILINOGEN 0.2 12/21/2014 1336   NITRITE Negative 06/13/2015 1012   NITRITE NEGATIVE 12/21/2014 1336   LEUKOCYTESUR Negative 06/13/2015 1012      Assessment & Plan:    1. Weak urinary stream Pt stable on combination. He asked about stopping finasteride and I think it's appropriate. Prostate small on exam and PSA very low. He will continue tamsulosin.  - Bladder Scan (Post Void Residual) in office   No Follow-up on file.  ESKR[place],  [place], [place] [address] [place], [place] [address] [phone]

## 2016-06-20 ENCOUNTER — Ambulatory Visit (INDEPENDENT_AMBULATORY_CARE_PROVIDER_SITE_OTHER): Payer: Medicare Other | Admitting: Psychology

## 2016-06-20 ENCOUNTER — Other Ambulatory Visit: Payer: Self-pay

## 2016-06-20 DIAGNOSIS — F449 Dissociative and conversion disorder, unspecified: Secondary | ICD-10-CM

## 2016-06-20 DIAGNOSIS — F332 Major depressive disorder, recurrent severe without psychotic features: Secondary | ICD-10-CM | POA: Diagnosis not present

## 2016-06-20 MED ORDER — INSULIN NPH (HUMAN) (ISOPHANE) 100 UNIT/ML ~~LOC~~ SUSP: SUBCUTANEOUS | 2 refills | Status: DC

## 2016-06-25 ENCOUNTER — Telehealth: Payer: Self-pay | Admitting: Family

## 2016-06-25 DIAGNOSIS — M0579 Rheumatoid arthritis with rheumatoid factor of multiple sites without organ or systems involvement: Secondary | ICD-10-CM | POA: Diagnosis not present

## 2016-06-25 NOTE — Telephone Encounter (Signed)
Please call pt BP at urology appt VERY high. 170's.  How is his BP? There were no problems at his last visit here; just want to ensure that he is not having elevated BP  thanks

## 2016-06-26 NOTE — Telephone Encounter (Signed)
Patient stated that his BP has been back in normal range.

## 2016-07-02 ENCOUNTER — Encounter (HOSPITAL_COMMUNITY): Payer: Self-pay | Admitting: Emergency Medicine

## 2016-07-02 ENCOUNTER — Emergency Department (HOSPITAL_COMMUNITY): Payer: Medicare Other

## 2016-07-02 ENCOUNTER — Ambulatory Visit (INDEPENDENT_AMBULATORY_CARE_PROVIDER_SITE_OTHER): Payer: Medicare Other | Admitting: Psychiatry

## 2016-07-02 ENCOUNTER — Emergency Department (HOSPITAL_COMMUNITY)
Admission: EM | Admit: 2016-07-02 | Discharge: 2016-07-02 | Disposition: A | Discharge status: Home / Self Care | Payer: Medicare Other | Attending: Emergency Medicine | Admitting: Emergency Medicine

## 2016-07-02 ENCOUNTER — Encounter: Payer: Self-pay | Admitting: Psychiatry

## 2016-07-02 ENCOUNTER — Telehealth: Payer: Self-pay

## 2016-07-02 VITALS — BP 187/104 | HR 78 | Temp 98.7°F | Wt 258.4 lb

## 2016-07-02 DIAGNOSIS — R519 Headache, unspecified: Secondary | ICD-10-CM

## 2016-07-02 DIAGNOSIS — R51 Headache: Secondary | ICD-10-CM | POA: Insufficient documentation

## 2016-07-02 DIAGNOSIS — Z794 Long term (current) use of insulin: Secondary | ICD-10-CM | POA: Insufficient documentation

## 2016-07-02 DIAGNOSIS — R791 Abnormal coagulation profile: Secondary | ICD-10-CM | POA: Diagnosis not present

## 2016-07-02 DIAGNOSIS — I6523 Occlusion and stenosis of bilateral carotid arteries: Secondary | ICD-10-CM | POA: Diagnosis not present

## 2016-07-02 DIAGNOSIS — F331 Major depressive disorder, recurrent, moderate: Secondary | ICD-10-CM

## 2016-07-02 DIAGNOSIS — I10 Essential (primary) hypertension: Secondary | ICD-10-CM | POA: Insufficient documentation

## 2016-07-02 DIAGNOSIS — F17210 Nicotine dependence, cigarettes, uncomplicated: Secondary | ICD-10-CM | POA: Insufficient documentation

## 2016-07-02 DIAGNOSIS — I2510 Atherosclerotic heart disease of native coronary artery without angina pectoris: Secondary | ICD-10-CM | POA: Diagnosis not present

## 2016-07-02 DIAGNOSIS — Z951 Presence of aortocoronary bypass graft: Secondary | ICD-10-CM | POA: Insufficient documentation

## 2016-07-02 DIAGNOSIS — I679 Cerebrovascular disease, unspecified: Secondary | ICD-10-CM | POA: Diagnosis not present

## 2016-07-02 DIAGNOSIS — Z8673 Personal history of transient ischemic attack (TIA), and cerebral infarction without residual deficits: Secondary | ICD-10-CM | POA: Insufficient documentation

## 2016-07-02 DIAGNOSIS — E1140 Type 2 diabetes mellitus with diabetic neuropathy, unspecified: Secondary | ICD-10-CM | POA: Insufficient documentation

## 2016-07-02 DIAGNOSIS — F419 Anxiety disorder, unspecified: Secondary | ICD-10-CM

## 2016-07-02 DIAGNOSIS — Z7982 Long term (current) use of aspirin: Secondary | ICD-10-CM | POA: Insufficient documentation

## 2016-07-02 DIAGNOSIS — F411 Generalized anxiety disorder: Secondary | ICD-10-CM

## 2016-07-02 DIAGNOSIS — R29818 Other symptoms and signs involving the nervous system: Secondary | ICD-10-CM | POA: Diagnosis not present

## 2016-07-02 DIAGNOSIS — E1151 Type 2 diabetes mellitus with diabetic peripheral angiopathy without gangrene: Secondary | ICD-10-CM | POA: Insufficient documentation

## 2016-07-02 DIAGNOSIS — R29810 Facial weakness: Secondary | ICD-10-CM | POA: Diagnosis not present

## 2016-07-02 DIAGNOSIS — Z955 Presence of coronary angioplasty implant and graft: Secondary | ICD-10-CM | POA: Insufficient documentation

## 2016-07-02 LAB — COMPREHENSIVE METABOLIC PANEL
ALT: 13 U/L — ABNORMAL LOW (ref 17–63)
AST: 14 U/L — ABNORMAL LOW (ref 15–41)
Albumin: 3.7 g/dL (ref 3.5–5.0)
Alkaline Phosphatase: 92 U/L (ref 38–126)
Anion gap: 10 (ref 5–15)
BUN: 22 mg/dL — ABNORMAL HIGH (ref 6–20)
CO2: 24 mmol/L (ref 22–32)
Calcium: 8.9 mg/dL (ref 8.9–10.3)
Chloride: 104 mmol/L (ref 101–111)
Creatinine, Ser: 1.62 mg/dL — ABNORMAL HIGH (ref 0.61–1.24)
GFR calc Af Amer: 51 mL/min — ABNORMAL LOW (ref >60)
GFR calc non Af Amer: 44 mL/min — ABNORMAL LOW (ref >60)
Glucose, Bld: 137 mg/dL — ABNORMAL HIGH (ref 65–99)
Potassium: 5 mmol/L (ref 3.5–5.1)
Sodium: 138 mmol/L (ref 135–145)
Total Bilirubin: 0.6 mg/dL (ref 0.3–1.2)
Total Protein: 6.9 g/dL (ref 6.5–8.1)

## 2016-07-02 LAB — DIFFERENTIAL
Basophils Absolute: 0 10*3/uL (ref 0.0–0.1)
Basophils Relative: 0 %
Eosinophils Absolute: 0.2 10*3/uL (ref 0.0–0.7)
Eosinophils Relative: 2 %
Lymphocytes Relative: 28 %
Lymphs Abs: 2.2 10*3/uL (ref 0.7–4.0)
Monocytes Absolute: 0.5 10*3/uL (ref 0.1–1.0)
Monocytes Relative: 6 %
Neutro Abs: 4.8 10*3/uL (ref 1.7–7.7)
Neutrophils Relative %: 64 %

## 2016-07-02 LAB — CBG MONITORING, ED: Glucose-Capillary: 145 mg/dL — ABNORMAL HIGH (ref 65–99)

## 2016-07-02 LAB — I-STAT CHEM 8, ED
BUN: 32 mg/dL — ABNORMAL HIGH (ref 6–20)
Calcium, Ion: 1.06 mmol/L — ABNORMAL LOW (ref 1.15–1.40)
Chloride: 106 mmol/L (ref 101–111)
Creatinine, Ser: 1.6 mg/dL — ABNORMAL HIGH (ref 0.61–1.24)
Glucose, Bld: 139 mg/dL — ABNORMAL HIGH (ref 65–99)
HCT: 42 % (ref 39.0–52.0)
Hemoglobin: 14.3 g/dL (ref 13.0–17.0)
Potassium: 5.4 mmol/L — ABNORMAL HIGH (ref 3.5–5.1)
Sodium: 139 mmol/L (ref 135–145)
TCO2: 25 mmol/L (ref 0–100)

## 2016-07-02 LAB — I-STAT TROPONIN, ED: Troponin i, poc: 0 ng/mL (ref 0.00–0.08)

## 2016-07-02 LAB — CBC
HCT: 41.8 % (ref 39.0–52.0)
Hemoglobin: 14 g/dL (ref 13.0–17.0)
MCH: 31.7 pg (ref 26.0–34.0)
MCHC: 33.5 g/dL (ref 30.0–36.0)
MCV: 94.8 fL (ref 78.0–100.0)
Platelets: 150 10*3/uL (ref 150–400)
RBC: 4.41 MIL/uL (ref 4.22–5.81)
RDW: 13.7 % (ref 11.5–15.5)
WBC: 7.6 10*3/uL (ref 4.0–10.5)

## 2016-07-02 LAB — PROTIME-INR
INR: 1.02
Prothrombin Time: 13.5 seconds (ref 11.4–15.2)

## 2016-07-02 LAB — APTT: aPTT: 35 seconds (ref 24–36)

## 2016-07-02 MED ORDER — BUSPIRONE HCL 10 MG PO TABS: 20.0000 mg | ORAL_TABLET | Freq: Two times a day (BID) | ORAL | 4 refills | Status: DC

## 2016-07-02 MED ORDER — HYDROCODONE-ACETAMINOPHEN 5-325 MG PO TABS
1.0000 | ORAL_TABLET | Freq: Once | ORAL | Status: AC
  Administered 2016-07-02: 1 via ORAL
  Filled 2016-07-02: qty 1

## 2016-07-02 MED ORDER — FENTANYL CITRATE (PF) 100 MCG/2ML IJ SOLN: 50.0000 ug | Freq: Once | INTRAMUSCULAR | Status: DC

## 2016-07-02 MED ORDER — FENTANYL CITRATE (PF) 100 MCG/2ML IJ SOLN
50.0000 ug | Freq: Once | INTRAMUSCULAR | Status: DC
Start: 2016-07-02 — End: 2016-07-02

## 2016-07-02 MED ORDER — SERTRALINE HCL 100 MG PO TABS: 150.0000 mg | ORAL_TABLET | Freq: Every day | ORAL | 4 refills | Status: DC

## 2016-07-02 MED ORDER — BUPROPION HCL ER (XL) 150 MG PO TB24: 150.0000 mg | ORAL_TABLET | Freq: Every day | ORAL | 4 refills | Status: DC

## 2016-07-02 NOTE — Discharge Instructions (Signed)
Please take full dose aspirin daily. See the neurologist as recommended.  Please return to the ER if the headache gets severe and in not improving, you have associated new one sided numbness, tingling, weakness or confusion, seizures, poor balance or poor vision.

## 2016-07-02 NOTE — ED Provider Notes (Signed)
[facility] DEPT Provider Note   CSN: [id number] Arrival date & time: 07/02/16  1107   An emergency department physician performed an initial assessment on this suspected stroke patient at 1109.  History   Chief Complaint Chief Complaint  Patient presents with  . Code Stroke    HPI [name] [name] is a 60 y.o. male.  HPI Pt complains of worsening of left sided facial droop and new left arm tingling and drift. Currently with HA. Sent from ED office. Hx of light sensitivity. Also with hx of bells palsy. Brought to ER for evaluation. No prior hx of stroke. Hx of HTN, HLD, DM, and DVT. No CP. No abdominal pain or back pain. Symptoms are moderate in severity     Past Medical History:  Diagnosis Date  . Adenomatous colon polyp   . Allergy   . Angina at rest [facility])    chronic  . Anxiety   . Arthritis   . Bell's palsy 2007  . Carotid artery occlusion   . Cataract    Dr. Dawna Part  . Coronary artery disease   . Depression   . Diabetes mellitus   . Diverticulosis   . Duodenitis   . DVT (deep venous thrombosis) (HCC)    in leg  . Gastropathy   . Heart murmur   . Helicobacter pylori gastritis   . Hyperlipidemia   . Hypertension   . Mild aortic stenosis   . Neuropathy ([place])   . Obesity   . Peripheral vascular disease ([place])   . Post splenectomy syndrome   . Psoriasis   . Sleep apnea   . SOB (shortness of breath)   . Stroke ([place])   . Tobacco use disorder    recently quit  . Tubular adenoma 08/2013   Dr. Hilarie Fredrickson  . Weak urinary stream     Patient Active Problem List   Diagnosis Date Noted  . Lung nodule < 6cm on CT 03/29/2016  . Emphysema lung ([place]) 03/29/2016  . Hepatitis C antibody test positive 10/13/2015  . Abnormal immunological findings in specimens from other organs, systems and tissues 10/13/2015  . Other general symptoms and signs 10/13/2015  . Other specified abnormal immunological findings in serum 10/13/2015  . Headache 09/14/2015  . Constipation  10/05/2014  . PVD (peripheral vascular disease) ([place]) 04/23/2014  . Memory loss 04/23/2014  . Peripheral vascular disease ([place]) 04/23/2014  . Acute kidney failure ([place]) 02/22/2014  . Acute renal failure ([place]) 02/22/2014  . Aortic valve disorder 01/11/2014  . Pain in right knee 12/07/2013  . Pain in joint involving lower leg 12/07/2013  . Routine history and physical examination of adult 11/23/2013  . Nail disorder 11/23/2013  . History of Helicobacter pylori infection 10/30/2013  . Helicobacter pylori infection 08/06/2013  . Anxiety 08/06/2013  . Anxiety state 08/06/2013  . Anxiety disorder 08/06/2013  . Gastrojejunal ulcer without hemorrhage or perforation 08/06/2013  . Benign fibroma of prostate 03/25/2013  . Disorder of male genital organs 03/25/2013  . Benign prostatic hypertrophy without urinary obstruction 03/25/2013  . Pain in testicle 03/25/2013  . Other symptoms and signs involving the genitourinary system 03/25/2013  . Enlarged prostate without lower urinary tract symptoms (luts) 03/25/2013  . Other specified diabetes mellitus without complications 74/06/8785  . Obesity 02/12/2013  . Coronary atherosclerosis 06/06/2012  . Personal history of tobacco use, presenting hazards to health 06/06/2012  . Other specified diabetes mellitus with diabetic peripheral angiopathy without gangrene ([place]) 06/06/2012  . Tobacco use 06/06/2012  .  Nonrheumatic aortic valve stenosis   . Low back pain 12/27/2011  . Depressive disorder 05/28/2011  . Major depressive disorder, single episode 05/28/2011  . Occlusion and stenosis of unspecified carotid artery 04/17/2011  . Disorder of artery or arteriole ([place]) 04/17/2011  . Sleep apnea 03/05/2011  . Psoriasis 03/05/2011  . Neuropathy ([place]) 03/05/2011  . DM (diabetes mellitus), type 2 with peripheral vascular complications ([place]) 95/18/8416  . Type 2 diabetes mellitus with diabetic peripheral angiopathy without gangrene ([place]) 03/05/2011  .  Polyneuropathy ([place]) 03/05/2011  . Coronary artery disease   . Other and unspecified hyperlipidemia   . Essential hypertension     Past Surgical History:  Procedure Laterality Date  . CARDIAC CATHETERIZATION  08/2010   LAD: 80% ISR, RCA: 80% ostial, OM 80-90%  . CAROTID ENDARTERECTOMY  08/2010   left/ Dr. Kellie Simmering  . COLONOSCOPY    . CORONARY ANGIOPLASTY     LAD: before CABG  . CORONARY ARTERY BYPASS GRAFT  09/06/2010   At [facility]: LIMA to LAD, left radial to RCA, sequential SVG to OM3 and 4  . heart stents  Jan 2011   leg stents 06/2009 and 03/2010  . PTA of illiac and SFA  multiple   Dr. Ronalee Belts, s/p revision 11/2012  . SPLENECTOMY         Home Medications    Prior to Admission medications   Medication Sig Start Date End Date Taking? Authorizing Provider  albuterol (PROAIR HFA) 108 (90 Base) MCG/ACT inhaler Inhale into the lungs. 09/17/14   Historical Provider, MD  aspirin 81 MG tablet Take 81 mg by mouth daily.    Historical Provider, MD  BAYER MICROLET LANCETS lancets use as directed three times a day Dx: E11.9 01/27/16   Jackolyn Confer, MD  buPROPion (WELLBUTRIN XL) 150 MG 24 hr tablet Take 1 tablet (150 mg total) by mouth daily. 07/02/16   Himabindu Ravi, MD  busPIRone (BUSPAR) 10 MG tablet Take 2 tablets (20 mg total) by mouth 2 (two) times daily. 07/02/16   Himabindu Ravi, MD  carvedilol (COREG) 12.5 MG tablet Take 1 tablet (12.5 mg total) by mouth 2 (two) times daily. 04/09/16 07/08/16  Wellington Hampshire, MD  cholecalciferol (VITAMIN D) 1000 UNITS tablet Take 1,000 Units by mouth daily.      Historical Provider, MD  Cinnamon 500 MG capsule Take 1,000 mg by mouth daily.    Historical Provider, MD  clopidogrel (PLAVIX) 75 MG tablet take 1 tablet by mouth once daily 06/09/15   Wellington Hampshire, MD  cyclobenzaprine (FLEXERIL) 10 MG tablet take 1 tablet by mouth three times a day if needed for muscle spasm 03/06/16   Burnard Hawthorne, FNP  Glucosamine-Chondroit-Vit C-Mn  (GLUCOSAMINE CHONDR 500 COMPLEX) CAPS Take 1,000 each by mouth daily.    Historical Provider, MD  glucose blood (BAYER CONTOUR TEST) test strip Use to test blood sugar three times daily as instructed.  Dx code: E11.51 05/15/16   Philemon Kingdom, MD  insulin NPH Human (NOVOLIN N RELION) 100 UNIT/ML injection Inject 40 units in am and 20 units in the evening 06/20/16   Philemon Kingdom, MD  Insulin Pen Needle (NOVOFINE) 32G X 6 MM MISC Use three times daily 11/03/13   Jackolyn Confer, MD  insulin regular (NOVOLIN R,HUMULIN R) 100 units/mL injection Inject 0.1-0.3 mLs (10-30 Units total) into the skin 3 (three) times daily before meals. 07/29/15   Philemon Kingdom, MD  Insulin Syringe-Needle U-100 (RELION INSULIN SYRINGE 1ML/31G) 31G X 5/16"  1 ML MISC USE THREE TIMES A DAY. 07/29/15   Philemon Kingdom, MD  ketoconazole (NIZORAL) 2 % cream apply to affected area at bedtime 04/14/15   Historical Provider, MD  Lancet Device MISC 1 application by Does not apply route as needed. 01/26/16   Jackolyn Confer, MD  losartan (COZAAR) 25 MG tablet take 1 tablet by mouth once daily 06/02/14   Jackolyn Confer, MD  magnesium gluconate (MAGONATE) 500 MG tablet Take 1,000 mg by mouth daily.    Historical Provider, MD  Multiple Vitamin (MULTIVITAMIN) capsule Take 1 capsule by mouth daily.      Historical Provider, MD  naloxegol oxalate (MOVANTIK) 25 MG TABS tablet Take 1 tablet (25 mg total) by mouth daily. 1 hour before a meal 02/22/15   Jerene Bears, MD  nitazoxanide (ALINIA) 500 MG tablet Take 1 tablet (500 mg total) by mouth 2 (two) times daily with a meal. 07/02/13   Jackolyn Confer, MD  nitroGLYCERIN (NITROSTAT) 0.4 MG SL tablet Place 1 tablet (0.4 mg total) under the tongue every 5 (five) minutes as needed. 06/24/12   Wellington Hampshire, MD  Omega-3 Fatty Acids (FISH OIL PO) Take by mouth. 01/10/10   Historical Provider, MD  omeprazole (PRILOSEC) 20 MG capsule Take 1 capsule (20 mg total) by mouth 2 (two) times daily.  09/24/13   Jerene Bears, MD  polyethylene glycol [facility]) packet Take 17 g by mouth daily. 10/30/13   Jerene Bears, MD  sertraline (ZOLOFT) 100 MG tablet Take 1.5 tablets (150 mg total) by mouth daily. 07/02/16   Himabindu Ravi, MD  simvastatin (ZOCOR) 20 MG tablet take 1 tablet by mouth once daily AT 6PM 01/18/16   Jackolyn Confer, MD  tamsulosin [facility]) 0.4 MG CAPS capsule take 2 capsule by mouth daily 06/18/16   Festus Aloe, MD  triamcinolone ointment (KENALOG) 0.1 % apply to affected area twice a day [place] 03/10/15   Historical Provider, MD  [place] X 5/16" 1 ML MISC USE ONE  THREE TIMES DAILY 06/04/16   Philemon Kingdom, MD    Family History Family History  Problem Relation Age of Onset  . Lung cancer Father 62  . Breast cancer Sister 38  . Dementia Mother   . Alzheimer's disease Mother   . Diabetes Paternal Grandfather   . Colon polyps Paternal Grandfather 40    Social History Social History  Substance Use Topics  . Smoking status: Current Every Day Smoker    Packs/day: 0.75    Years: 40.00    Types: Cigarettes  . Smokeless tobacco: Never Used     Comment: smokes 2-3 cigarettes every 3 weeks  . Alcohol use No     Allergies   Glipizide; Metrizamide; Penicillins; and Contrast media [iodinated diagnostic agents]   Review of Systems Review of Systems  All other systems reviewed and are negative.    Physical Exam Updated Vital Signs BP 156/93   Pulse 72   Temp 97.9 F (36.6 C) (Oral)   Resp 22   Ht 5\' 7"  (1.702 m)   Wt 261 lb 14.5 oz (118.8 kg)   SpO2 95%   BMI 41.02 kg/m   Physical Exam  Constitutional: He is oriented to person, place, and time. He appears well-developed and well-nourished.  HENT:  Head: Normocephalic and atraumatic.  Eyes: EOM are normal.  Neck: Normal range of motion.  Cardiovascular: Normal rate, regular rhythm, normal heart sounds and intact  distal pulses.   Pulmonary/Chest: Effort  normal and breath sounds normal. No respiratory distress.  Abdominal: Soft. He exhibits no distension. There is no tenderness.  Musculoskeletal: Normal range of motion.  Neurological: He is alert and oriented to person, place, and time.  Skin: Skin is warm and dry.  Psychiatric: He has a normal mood and affect. Judgment normal.  Nursing note and vitals reviewed.    ED Treatments / Results  Labs (all labs ordered are listed, but only abnormal results are displayed) Labs Reviewed  COMPREHENSIVE METABOLIC PANEL - Abnormal; Notable for the following:       Result Value   Glucose, Bld 137 (*)    BUN 22 (*)    Creatinine, Ser 1.62 (*)    AST 14 (*)    ALT 13 (*)    GFR calc non Af Amer 44 (*)    GFR calc Af Amer 51 (*)    All other components within normal limits  CBG MONITORING, ED - Abnormal; Notable for the following:    Glucose-Capillary 145 (*)    All other components within normal limits  I-STAT CHEM 8, ED - Abnormal; Notable for the following:    Potassium 5.4 (*)    BUN 32 (*)    Creatinine, Ser 1.60 (*)    Glucose, Bld 139 (*)    Calcium, Ion 1.06 (*)    All other components within normal limits  PROTIME-INR  APTT  CBC  DIFFERENTIAL  I-STAT TROPOININ, ED    EKG  EKG Interpretation  Date/Time:  Monday July 02 2016 11:28:35 EST Ventricular Rate:  71 PR Interval:    QRS Duration: 103 QT Interval:  409 QTC Calculation: 445 R Axis:   -31 Text Interpretation:  Sinus rhythm Left axis deviation Low voltage, precordial leads No significant change was found Confirmed by Bryonna Sundby  MD, Lennette Bihari ([id number]) on 07/02/2016 3:28:01 PM       Radiology Ct Head Code Stroke W/o Cm  Result Date: 07/02/2016 CLINICAL DATA:  Code stroke. 60 year old male with left facial droop, left arm tingling and weakness. Initial encounter. EXAM: CT HEAD WITHOUT CONTRAST TECHNIQUE: Contiguous axial images were obtained from the base of the skull through the vertex without intravenous contrast.  COMPARISON:  Brain MRI 09/30/2015.  Head CT 12/21/2014. FINDINGS: Brain: No midline shift, ventriculomegaly, mass effect, evidence of mass lesion, intracranial hemorrhage or evidence of cortically based acute infarction. Gray-white matter differentiation is within normal limits throughout the brain. Punctate dystrophic calcification in the right frontal lobe on image 26 is stable. No cortical encephalomalacia identified. Vascular: Extensive Calcified atherosclerosis at the skull base. No suspicious intracranial vascular hyperdensity. Skull: No acute osseous abnormality identified. Sinuses/Orbits: Visualized paranasal sinuses and mastoids are stable and well pneumatized. Other: No acute orbit or scalp soft tissue finding. ASPECTS ([place] Stroke Program Early CT Score) - Ganglionic level infarction (caudate, lentiform nuclei, internal capsule, insula, M1-M3 cortex): 7 - Supraganglionic infarction (M4-M6 cortex): 3 Total score (0-10 with 10 being normal): 10 IMPRESSION: 1. Stable and negative noncontrast CT appearance of the brain. 2. ASPECTS is 10. 3. The above was relayed via text pager to Dr. Roland Rack on 07/02/2016 at 11:23 . Electronically Signed   By: Genevie Ann M.D.   On: 07/02/2016 11:24    Procedures Procedures (including critical care time)  Medications Ordered in ED Medications  fentaNYL (SUBLIMAZE) injection 50 mcg (not administered)     Initial Impression / Assessment and Plan / ED Course  I have reviewed  the triage vital signs and the nursing notes.  Pertinent labs & imaging results that were available during my care of the patient were reviewed by me and considered in my medical decision making (see chart for details).  Clinical Course     Presented as acute code stroke. Well appearing now. Seen by neurology. Felt it was likely complicated migraine. Neurology recommending MRI. If negative, home   Final Clinical Impressions(s) / ED Diagnoses   Final diagnoses:  None     New Prescriptions New Prescriptions   No medications on file     Jola Schmidt, MD 07/02/16 1530

## 2016-07-02 NOTE — ED Notes (Signed)
MD at bedside. MD requesting RN to attempt swallow screen dispite hx of dysphagia. RN completed swallow screen, pt with NO difficulties. NO change in lung sounds after water or cracker. MD informed.

## 2016-07-02 NOTE — ED Provider Notes (Signed)
  Physical Exam  BP 157/94   Pulse 79   Temp 97.9 F (36.6 C) (Oral)   Resp 18   Ht 5\' 7"  (1.702 m)   Wt 261 lb 14.5 oz (118.8 kg)   SpO2 99%   BMI 41.02 kg/m   Physical Exam  ED Course  Procedures  MDM  Pt came in with L sided facial droop, headaches, numbness and tingling. Hx of Bells. Headache resolved. MRI ordered to ensure no acute stroke. Symptoms started this am, pt came is as code stroke.  MRI results show possible subacute stroke in the occiput. Pt has no complains. Reports that he has no dizziness/ vision complains, no new numbness, tingling. He was most concerned about the headache. Had Neurologist review the imaging -and Dr. Cheral Marker thinks that there is most likely an artifact.  He recommends antiplatelet therapy and outpatient f/u.  Results from the ER workup discussed with the patient face to face and all questions answered to the best of my ability.  Please return to the ER if your symptoms worsen; you have increased pain, fevers, chills, inability to keep any medications down, confusion. Otherwise see the outpatient doctor as requested.        [name] Biles, MD 07/02/16 1940

## 2016-07-02 NOTE — ED Notes (Signed)
Pt ambulated to restroom from hallway bed, tolerated well. Nurse was notified.

## 2016-07-02 NOTE — ED Notes (Signed)
Pt. Upset about not seeing a physician, not having MRI, and not eating. RN calmed patient down. EDP advising to allow patient to complete stroke swallow exam even though he has hx of dysphasia. Patient agreed to do exam.

## 2016-07-02 NOTE — ED Notes (Signed)
Dr. Kathrynn Humble at bedside speaking with pt

## 2016-07-02 NOTE — ED Notes (Signed)
Pt. Hallway patient at this time. Pt. Ambulatory to restroom with steady gait at this time. Pt. Insisting on sitting up on the side of the bed. Pt. Aox4.

## 2016-07-02 NOTE — ED Notes (Signed)
Pt's CBG result was 145. Informed Levada Dy - RN.

## 2016-07-02 NOTE — Progress Notes (Signed)
Patient ID: [name] [name], male   DOB: Mar 30, 1956, 60 y.o.   MRN: [id number] [facility] MD/PA/NP OP Progress Note  07/02/2016 9:25 AM AVERY KLINGBEIL  MRN:  [id number]  Subjective:  Patient returns for follow-up was generalized anxiety disorder and depression. Patient reports that overall he is doing okay. Compliant with his medications and denies any side effects. His wife`s son has been more responsible and that has reduced their stress. Fair sleep and appetite. Reports being ready for christmas and looking forward to the holidays.  Chief Complaint: okay Chief Complaint    Follow-up; Medication Refill     Visit Diagnosis:     ICD-9-CM ICD-10-CM   1. GAD (generalized anxiety disorder) 300.02 F41.1   2. Major depressive disorder, recurrent episode, moderate (HCC) 296.32 F33.1     Past Medical History:  Past Medical History:  Diagnosis Date  . Adenomatous colon polyp   . Allergy   . Angina at rest [facility])    chronic  . Anxiety   . Arthritis   . Bell's palsy 2007  . Carotid artery occlusion   . Cataract    Dr. Dawna Part  . Coronary artery disease   . Depression   . Diabetes mellitus   . Diverticulosis   . Duodenitis   . DVT (deep venous thrombosis) (HCC)    in leg  . Gastropathy   . Heart murmur   . Helicobacter pylori gastritis   . Hyperlipidemia   . Hypertension   . Mild aortic stenosis   . Neuropathy ([place])   . Obesity   . Peripheral vascular disease ([place])   . Post splenectomy syndrome   . Psoriasis   . Sleep apnea   . SOB (shortness of breath)   . Stroke ([place])   . Tobacco use disorder    recently quit  . Tubular adenoma 08/2013   Dr. Hilarie Fredrickson  . Weak urinary stream     Past Surgical History:  Procedure Laterality Date  . CARDIAC CATHETERIZATION  08/2010   LAD: 80% ISR, RCA: 80% ostial, OM 80-90%  . CAROTID ENDARTERECTOMY  08/2010   left/ Dr. Kellie Simmering  . COLONOSCOPY    . CORONARY ANGIOPLASTY     LAD: before CABG  . CORONARY ARTERY BYPASS GRAFT  09/06/2010   At  [facility]: LIMA to LAD, left radial to RCA, sequential SVG to OM3 and 4  . heart stents  Jan 2011   leg stents 06/2009 and 03/2010  . PTA of illiac and SFA  multiple   Dr. Ronalee Belts, s/p revision 11/2012  . SPLENECTOMY     Family History:  Family History  Problem Relation Age of Onset  . Lung cancer Father 3  . Breast cancer Sister 52  . Dementia Mother   . Alzheimer's disease Mother   . Diabetes Paternal Grandfather   . Colon polyps Paternal Grandfather 71   Social History:  Social History   Social History  . Marital status: Married    Spouse name: N/A  . Number of children: 0  . Years of education: N/A   Occupational History  .  Retired   Social History Main Topics  . Smoking status: Current Every Day Smoker    Packs/day: 0.75    Years: 40.00    Types: Cigarettes  . Smokeless tobacco: Never Used     Comment: smokes 2-3 cigarettes every 3 weeks  . Alcohol use No  . Drug use: No  . Sexual activity: No   Other Topics Concern  .  None   Social History Narrative   Married.      Lives in [place].       Retired   Additional History:   Assessment:   Musculoskeletal: Strength & Muscle Tone: within normal limits Gait & Station: unsteady walks with cane Patient leans: N/A  Psychiatric Specialty Exam: Medication Refill   Anxiety  Patient reports no insomnia, nervous/anxious behavior or suicidal ideas.    Depression         Associated symptoms include does not have insomnia and no suicidal ideas.  Past medical history includes anxiety.     Review of Systems  Psychiatric/Behavioral: Negative.  Negative for depression, hallucinations, memory loss, substance abuse and suicidal ideas. The patient is not nervous/anxious and does not have insomnia.   All other systems reviewed and are negative.   Blood pressure (!) 183/107, pulse 78, temperature 98.7 F (37.1 C), temperature source Oral, weight 258 lb 6.4 oz (117.2 kg).Body mass index is 41.71 kg/m.  General  Appearance: Well Groomed  Eye Contact:  Good  Speech:  Normal Rate  Volume:  Normal  Mood:  Good  Affect:  Congruent  Thought Process:  Normal  Orientation:  Full (Time, Place, and Person)  Thought Content:  Negative  Suicidal Thoughts:  No  Homicidal Thoughts:  No  Memory:  Immediate;   Good Recent;   Good Remote;   Good  Judgement:  Good  Insight:  Good  Psychomotor Activity:  Negative  Concentration:  Good  Recall:  Good  Fund of Knowledge: Good  Language: Good  Akathisia:  Negative  Handed:  Right  AIMS (if indicated):  N/A  Assets:  Communication Skills Desire for Improvement Social Support  ADL's:  Intact  Cognition: WNL  Sleep:  Good    Is the patient at risk to self?  No. Has the patient been a risk to self in the past 6 months?  No. Has the patient been a risk to self within the distant past?  No. Is the patient a risk to others?  No. Has the patient been a risk to others in the past 6 months?  No. Has the patient been a risk to others within the distant past?  No.  Current Medications: Current Outpatient Prescriptions  Medication Sig Dispense Refill  . albuterol (PROAIR HFA) 108 (90 Base) MCG/ACT inhaler Inhale into the lungs.    [name] aspirin 81 MG tablet Take 81 mg by mouth daily.    [name] BAYER MICROLET LANCETS lancets use as directed three times a day Dx: E11.9 100 each 12  . buPROPion (WELLBUTRIN XL) 150 MG 24 hr tablet Take 1 tablet (150 mg total) by mouth daily. 30 tablet 4  . busPIRone (BUSPAR) 10 MG tablet Take 2 tablets (20 mg total) by mouth 2 (two) times daily. 120 tablet 4  . carvedilol (COREG) 12.5 MG tablet Take 1 tablet (12.5 mg total) by mouth 2 (two) times daily. 60 tablet 11  . cholecalciferol (VITAMIN D) 1000 UNITS tablet Take 1,000 Units by mouth daily.      . Cinnamon 500 MG capsule Take 1,000 mg by mouth daily.    . clopidogrel (PLAVIX) 75 MG tablet take 1 tablet by mouth once daily 90 tablet 3  . cyclobenzaprine (FLEXERIL) 10 MG tablet take  1 tablet by mouth three times a day if needed for muscle spasm 30 tablet 0  . Glucosamine-Chondroit-Vit C-Mn (GLUCOSAMINE CHONDR 500 COMPLEX) CAPS Take 1,000 each by mouth daily.    [name]  glucose blood (BAYER CONTOUR TEST) test strip Use to test blood sugar three times daily as instructed.  Dx code: E11.51 300 each 3  . insulin NPH Human (NOVOLIN N RELION) 100 UNIT/ML injection Inject 40 units in am and 20 units in the evening 40 mL 2  . Insulin Pen Needle (NOVOFINE) 32G X 6 MM MISC Use three times daily 100 each 5  . insulin regular (NOVOLIN R,HUMULIN R) 100 units/mL injection Inject 0.1-0.3 mLs (10-30 Units total) into the skin 3 (three) times daily before meals. 40 mL 5  . Insulin Syringe-Needle U-100 (RELION INSULIN SYRINGE 1ML/31G) 31G X 5/16" 1 ML MISC USE THREE TIMES A DAY. 290 each 1  . ketoconazole (NIZORAL) 2 % cream apply to affected area at bedtime  0  . Lancet Device MISC 1 application by Does not apply route as needed. 100 each 11  . losartan (COZAAR) 25 MG tablet take 1 tablet by mouth once daily 90 tablet 3  . magnesium gluconate (MAGONATE) 500 MG tablet Take 1,000 mg by mouth daily.    . Multiple Vitamin (MULTIVITAMIN) capsule Take 1 capsule by mouth daily.      . naloxegol oxalate (MOVANTIK) 25 MG TABS tablet Take 1 tablet (25 mg total) by mouth daily. 1 hour before a meal 30 tablet 2  . nitazoxanide (ALINIA) 500 MG tablet Take 1 tablet (500 mg total) by mouth 2 (two) times daily with a meal. 20 tablet 0  . nitroGLYCERIN (NITROSTAT) 0.4 MG SL tablet Place 1 tablet (0.4 mg total) under the tongue every 5 (five) minutes as needed. 25 tablet 1  . Omega-3 Fatty Acids (FISH OIL PO) Take by mouth.    [name] omeprazole (PRILOSEC) 20 MG capsule Take 1 capsule (20 mg total) by mouth 2 (two) times daily. 20 capsule 0  . polyethylene glycol (MIRALAX) packet Take 17 g by mouth daily. 14 each 3  . sertraline (ZOLOFT) 100 MG tablet Take 1.5 tablets (150 mg total) by mouth daily. 45 tablet 4  .  simvastatin (ZOCOR) 20 MG tablet take 1 tablet by mouth once daily AT 6PM 90 tablet 3  . tamsulosin (FLOMAX) 0.4 MG CAPS capsule take 2 capsule by mouth daily 60 capsule 11  . triamcinolone ointment (KENALOG) 0.1 % apply to affected area twice a day AVOID FACE, GROIN AND UNDERARMS  0  . ULTICARE INSULIN SYRINGE 31G X 5/16" 1 ML MISC USE ONE  THREE TIMES DAILY 200 each 2   No current facility-administered medications for this visit.     Medical Decision Making:  Established Problem, Stable/Improving (1) and Review of New Medication or Change in Dosage (2)  Treatment Plan Summary:Medication management and Plan   Depressive disorder, moderate, recurrent-stable We will continue the sertraline at 150 mg daily and  Wellbutrin XL 150 mg in the morning. Continue to see therapist, Dr. Cristy Folks.  Generalized anxiety disorder-stable We'll continue his BuSpar to 20 mg twice daily.    High BP- Patient to follow up with his PCP. A repeat of patient's blood pressure was done which revealed to be 182/104 and patient also reported having a headache this morning. Patient encouraged to go to the emergency room or urgent care to address his high blood pressure.  Patient will follow up in 3  months.   Tamy Accardo 07/02/2016, 9:25 AM

## 2016-07-02 NOTE — Telephone Encounter (Signed)
I was asked to see patient.  Patient appears to be concerned with left arm tingling .  I had patient lift arms and checked grip strengths, slightly less in the left.  Checked BP and confirmed high readings.  Patient has a slight left facial drop and eye droop.  Requested NP to see patient and she advised a 911 call to transport to [place].  EMS transported to the hospital.

## 2016-07-02 NOTE — Telephone Encounter (Addendum)
Patient comes in office states when he went to see psychiatrist BP was 183/107 then it was re checked 187/104.     Psychiatrist urged patient to go to urgent care or ER to be evaluated Patient chose to come to our office to be evaluated.   Blood pressure checked here Left arm 195/107 pulse 77 oxygen 98%, blood pressure  195/107 .  Repeat blood pressure right arm 172/82.   Patient stated that he woke up with headache at 7:00 am and headache was continuing to worsen, left arm was tingling, denied chest pain .  He also complained of his right shoulder hurting.    Patient reported compliance with taking blood pressure medications.   Patient states he has been monitoring blood pressure at home and it has been running fine.   He has a history of diabetic neuropathy.   Spoke with Mable Paris, FNP advised of symptoms she came advised of symptoms should go to ER for further work up.

## 2016-07-02 NOTE — ED Triage Notes (Addendum)
Pt from MD office via [place] with c/o sudden onset worsening left facial droop, left arm tingling and drift.  Pt reports left arm was becoming numb on arrival to ED.  Pt reports headache since 7 am with light sensitivity.  Hx Bells Palsy.  12 lead EKG unremarkable.  NAD, A&Ox4, ambulatory with EMS.

## 2016-07-04 ENCOUNTER — Ambulatory Visit (INDEPENDENT_AMBULATORY_CARE_PROVIDER_SITE_OTHER): Payer: Medicare Other | Admitting: Psychology

## 2016-07-04 DIAGNOSIS — F332 Major depressive disorder, recurrent severe without psychotic features: Secondary | ICD-10-CM

## 2016-07-04 DIAGNOSIS — F449 Dissociative and conversion disorder, unspecified: Secondary | ICD-10-CM

## 2016-07-09 ENCOUNTER — Ambulatory Visit (INDEPENDENT_AMBULATORY_CARE_PROVIDER_SITE_OTHER): Payer: Medicare Other | Admitting: Cardiovascular Disease

## 2016-07-09 ENCOUNTER — Encounter: Payer: Self-pay | Admitting: Cardiovascular Disease

## 2016-07-09 VITALS — BP 130/78 | HR 78 | Ht 66.5 in | Wt 257.5 lb

## 2016-07-09 DIAGNOSIS — I6523 Occlusion and stenosis of bilateral carotid arteries: Secondary | ICD-10-CM

## 2016-07-09 DIAGNOSIS — I10 Essential (primary) hypertension: Secondary | ICD-10-CM

## 2016-07-09 DIAGNOSIS — E784 Other hyperlipidemia: Secondary | ICD-10-CM

## 2016-07-09 DIAGNOSIS — I2510 Atherosclerotic heart disease of native coronary artery without angina pectoris: Secondary | ICD-10-CM

## 2016-07-09 DIAGNOSIS — I350 Nonrheumatic aortic (valve) stenosis: Secondary | ICD-10-CM

## 2016-07-09 DIAGNOSIS — E7849 Other hyperlipidemia: Secondary | ICD-10-CM

## 2016-07-09 MED ORDER — CLOPIDOGREL BISULFATE 75 MG PO TABS: 75.0000 mg | ORAL_TABLET | Freq: Every day | ORAL | 3 refills | Status: DC

## 2016-07-09 NOTE — Progress Notes (Signed)
Cardiology Office Note   Date:  07/09/2016   ID:  [name] [name], DOB June 04, 1956, MRN [id number]  PCP:  Mable Paris, FNP  Cardiologist:   [name], MD   Chief Complaint  Patient presents with  . other    F/u ED c/o elevated BP pt very frustrated today with ED visit. Meds reviewed verbally with pt.      History of Present Illness: [name] [name] is a 60 y.o. male who presents for a follow-up visit regarding coronary artery disease. He is status post CABG in 2012 for 3 vessel coronary artery disease. He has extensive medical problems that include carotid artery disease status post left carotid endarterectomy , aortic stenosis, tobacco use, obesity, psoriasis, type 2 diabetes, hypertension, hyperlipidemia and peripheral arterial disease which is being followed by Dr. Ronalee Belts. Most recent stress test was done in of 2013 which showed no evidence of ischemia. Echocardiogram in October 2017 showed low normal LV systolic function with an EF of 33-38%, grade 2 diastolic dysfunction, moderate aortic stenosis with mean gradient of 21 mmHg and mild to moderate mitral regurgitation.  Last week, he woke up with a bad headache and his blood pressure was very elevated. He reports that his blood pressure went up to 200/110. He went to the emergency room at [facility]. Initially there was possible concern of a small stroke when his MRI but it was then felt to be an artifact. His systolic blood pressure in the emergency room was 157. The patient was very stressed out about elevated blood pressure. During last visit, I switched metoprolol to carvedilol with subsequent improvement and elevated blood pressure. He reports that his blood pressure went down subsequently without intervention and has been reasonably controlled over the last few days. He denies any chest pain or shortness of breath.  Past Medical History:  Diagnosis Date  . Adenomatous colon polyp   . Allergy   . Angina at rest [facility])      chronic  . Anxiety   . Arthritis   . Bell's palsy 2007  . Carotid artery occlusion   . Cataract    Dr. Dawna Part  . Coronary artery disease   . Depression   . Diabetes mellitus   . Diverticulosis   . Duodenitis   . DVT (deep venous thrombosis) (HCC)    in leg  . Gastropathy   . Heart murmur   . Helicobacter pylori gastritis   . Hyperlipidemia   . Hypertension   . Mild aortic stenosis   . Neuropathy ([place])   . Obesity   . Peripheral vascular disease ([place])   . Post splenectomy syndrome   . Psoriasis   . Sleep apnea   . SOB (shortness of breath)   . Stroke ([place])   . Tobacco use disorder    recently quit  . Tubular adenoma 08/2013   Dr. Hilarie Fredrickson  . Weak urinary stream     Past Surgical History:  Procedure Laterality Date  . CARDIAC CATHETERIZATION  08/2010   LAD: 80% ISR, RCA: 80% ostial, OM 80-90%  . CAROTID ENDARTERECTOMY  08/2010   left/ Dr. Kellie Simmering  . COLONOSCOPY    . CORONARY ANGIOPLASTY     LAD: before CABG  . CORONARY ARTERY BYPASS GRAFT  09/06/2010   At [facility]: LIMA to LAD, left radial to RCA, sequential SVG to OM3 and 4  . heart stents  Jan 2011   leg stents 06/2009 and 03/2010  . PTA of illiac and SFA  multiple   Dr. Ronalee Belts, s/p revision 11/2012  . SPLENECTOMY       Current Outpatient Prescriptions  Medication Sig Dispense Refill  . albuterol (PROAIR HFA) 108 (90 Base) MCG/ACT inhaler Inhale 2 puffs into the lungs every 6 (six) hours as needed for shortness of breath.     [name] aspirin 81 MG tablet Take 81 mg by mouth daily.    [name] buPROPion (WELLBUTRIN XL) 150 MG 24 hr tablet Take 1 tablet (150 mg total) by mouth daily. 30 tablet 4  . busPIRone (BUSPAR) 10 MG tablet Take 2 tablets (20 mg total) by mouth 2 (two) times daily. 120 tablet 4  . carvedilol (COREG) 12.5 MG tablet Take 1 tablet (12.5 mg total) by mouth 2 (two) times daily. 60 tablet 11  . cholecalciferol (VITAMIN D) 1000 UNITS tablet Take 1,000 Units by mouth daily.      . Cinnamon 500 MG capsule Take  1,000 mg by mouth daily.    . clopidogrel (PLAVIX) 75 MG tablet take 1 tablet by mouth once daily 90 tablet 3  . cyclobenzaprine (FLEXERIL) 10 MG tablet take 1 tablet by mouth three times a day if needed for muscle spasm 30 tablet 0  . Glucosamine-Chondroit-Vit C-Mn (GLUCOSAMINE CHONDR 500 COMPLEX) CAPS Take 1 capsule by mouth daily.     . insulin NPH Human (NOVOLIN N RELION) 100 UNIT/ML injection Inject 40 units in am and 20 units in the evening (Patient taking differently: Inject 0-45 Units into the skin 2 (two) times daily as needed. ) 40 mL 2  . insulin regular (NOVOLIN R,HUMULIN R) 100 units/mL injection Inject 0.1-0.3 mLs (10-30 Units total) into the skin 3 (three) times daily before meals. (Patient taking differently: Inject 0-45 Units into the skin 2 (two) times daily as needed for high blood sugar. ) 40 mL 5  . ketoconazole (NIZORAL) 2 % cream apply to affected area AS NEEDED FOR IRRITATION  0  . losartan (COZAAR) 25 MG tablet take 1 tablet by mouth once daily 90 tablet 3  . magnesium gluconate (MAGONATE) 500 MG tablet Take 1,000 mg by mouth daily.    . Multiple Vitamin (MULTIVITAMIN) capsule Take 1 capsule by mouth daily.      . nitroGLYCERIN (NITROSTAT) 0.4 MG SL tablet Place 1 tablet (0.4 mg total) under the tongue every 5 (five) minutes as needed. 25 tablet 1  . omeprazole (PRILOSEC) 20 MG capsule Take 1 capsule (20 mg total) by mouth 2 (two) times daily. (Patient taking differently: Take 20 mg by mouth 2 (two) times daily as needed (HEARTBURN). ) 20 capsule 0  . sertraline (ZOLOFT) 100 MG tablet Take 1.5 tablets (150 mg total) by mouth daily. 45 tablet 4  . simvastatin (ZOCOR) 20 MG tablet take 1 tablet by mouth once daily AT 6PM 90 tablet 3  . tamsulosin (FLOMAX) 0.4 MG CAPS capsule take 2 capsule by mouth daily 60 capsule 11  . triamcinolone ointment (KENALOG) 0.1 % apply to affected area twice a day AVOID FACE, GROIN AND UNDERARMS  0   No current facility-administered medications  for this visit.     Allergies:   Contrast media [iodinated diagnostic agents]; Glipizide; Metrizamide; and Penicillins    Social History:  The patient  reports that he has been smoking Cigarettes.  He has a 30.00 pack-year smoking history. He has never used smokeless tobacco. He reports that he does not drink alcohol or use drugs.   Family History:  The patient's family history includes Alzheimer's disease  in his mother; Breast cancer (age of onset: 56) in his sister; Colon polyps (age of onset: 68) in his paternal grandfather; Dementia in his mother; Diabetes in his paternal grandfather; Lung cancer (age of onset: 87) in his father.    ROS:  Please see the history of present illness.   Otherwise, review of systems are positive for none.   All other systems are reviewed and negative.    PHYSICAL EXAM: VS:  BP 130/78 (BP Location: Left Arm, Patient Position: Sitting, Cuff Size: Large)   Pulse 78   Ht 5' 6.5" (1.689 m)   Wt 257 lb 8 oz (116.8 kg)   BMI 40.94 kg/m  , BMI Body mass index is 40.94 kg/m. GEN: Well nourished, well developed, in no acute distress  HEENT: normal  Neck: no JVD, carotid bruits, or masses Cardiac: RRR; no rubs, or gallops,no edema . 3/6 crescendo decrescendo systolic murmur in the aortic area which is mid peaking with diminished S2 Respiratory:  clear to auscultation bilaterally, normal work of breathing GI: soft, nontender, nondistended, + BS MS: no deformity or atrophy  Skin: warm and dry, no rash Neuro:  Strength and sensation are intact Psych: euthymic mood, full affect   EKG:  EKG is not ordered today.    Recent Labs: 10/13/2015: TSH 3.76 07/02/2016: ALT 13; BUN 32; Creatinine, Ser 1.60; Hemoglobin 14.3; Platelets 150; Potassium 5.4; Sodium 139    Lipid Panel    Component Value Date/Time   CHOL 118 12/13/2015 0846   TRIG 190.0 (H) 12/13/2015 0846   HDL 24.60 (L) 12/13/2015 0846   CHOLHDL 5 12/13/2015 0846   VLDL 38.0 12/13/2015 0846    LDLCALC 55 12/13/2015 0846   LDLDIRECT 64.9 06/01/2014 1102      Wt Readings from Last 3 Encounters:  07/09/16 257 lb 8 oz (116.8 kg)  07/02/16 261 lb 14.5 oz (118.8 kg)  06/18/16 256 lb 3.2 oz (116.2 kg)       No flowsheet data found.    ASSESSMENT AND PLAN:  1.  Coronary artery disease involving native coronary arteries without angina: He is overall stable from a cardiac standpoint with no worsening symptoms. Continue medical therapy.  2. Moderate aortic stenosis:  Repeat echocardiogram in October 2018.  3. Bilateral carotid disease status post left carotid endarterectomy. Carotid Doppler this month showed stable 40-59% right ICA stenosis and mild disease on the left side post CEA. A follow-up carotid Doppler is recommended in  September 2018.  4. Essential hypertension:  I suspect that his elevated blood pressure recently was due to headache and stress. His blood pressure has been controlled over the last few days. I advised him to take an extra dose of carvedilol if his blood pressure goes above 676 systolic. Otherwise I made no changes today. If blood pressure trends up, we can also consider increasing the dose of losartan.  5. Hyperlipidemia: Most recent LDL was 55 on current dose on simvastatin.    Disposition:   FU with me in 3 months  Signed,  [name], MD  07/09/2016 3:12 PM    [place]

## 2016-07-09 NOTE — Patient Instructions (Signed)
Medication Instructions:  Your physician recommends that you continue on your current medications as directed. Please refer to the Current Medication list given to you today. Continue to monitor your blood pressure. Take an extra carvedilol if systolic (top number) blood pressure is greater than 150.    Labwork: none  Testing/Procedures: none  Follow-Up: Your physician recommends that you schedule a follow-up appointment in: 3 months with Dr. Fletcher Anon.    Any Other Special Instructions Will Be Listed Below (If Applicable).     If you need a refill on your cardiac medications before your next appointment, please call your pharmacy.

## 2016-07-11 ENCOUNTER — Telehealth: Payer: Self-pay | Admitting: Family

## 2016-07-11 ENCOUNTER — Ambulatory Visit (INDEPENDENT_AMBULATORY_CARE_PROVIDER_SITE_OTHER): Payer: Medicare Other | Admitting: Psychology

## 2016-07-11 DIAGNOSIS — F332 Major depressive disorder, recurrent severe without psychotic features: Secondary | ICD-10-CM

## 2016-07-11 DIAGNOSIS — F449 Dissociative and conversion disorder, unspecified: Secondary | ICD-10-CM

## 2016-07-11 NOTE — Telephone Encounter (Signed)
Okay to switch Set him up when appt due

## 2016-07-11 NOTE — Telephone Encounter (Signed)
That is fine 

## 2016-07-11 NOTE — Telephone Encounter (Signed)
Pt wanted to switch providers from [name] [name] to dr [name].  He stated his cardology dr wanted pt to see an internal med dr. Madaline Brilliant to switch??

## 2016-07-12 NOTE — Telephone Encounter (Signed)
Left message asking pt to call office  °

## 2016-07-25 ENCOUNTER — Ambulatory Visit: Payer: Medicare Other | Admitting: Psychology

## 2016-07-30 ENCOUNTER — Ambulatory Visit: Payer: Self-pay | Admitting: Neurology

## 2016-07-30 DIAGNOSIS — Z79899 Other long term (current) drug therapy: Secondary | ICD-10-CM | POA: Diagnosis not present

## 2016-07-30 DIAGNOSIS — L4050 Arthropathic psoriasis, unspecified: Secondary | ICD-10-CM | POA: Diagnosis not present

## 2016-07-30 DIAGNOSIS — M150 Primary generalized (osteo)arthritis: Secondary | ICD-10-CM | POA: Diagnosis not present

## 2016-07-30 DIAGNOSIS — L409 Psoriasis, unspecified: Secondary | ICD-10-CM | POA: Diagnosis not present

## 2016-07-30 DIAGNOSIS — Z6841 Body Mass Index (BMI) 40.0 and over, adult: Secondary | ICD-10-CM | POA: Diagnosis not present

## 2016-07-30 DIAGNOSIS — M5481 Occipital neuralgia: Secondary | ICD-10-CM | POA: Diagnosis not present

## 2016-07-30 DIAGNOSIS — M2550 Pain in unspecified joint: Secondary | ICD-10-CM | POA: Diagnosis not present

## 2016-07-30 DIAGNOSIS — R768 Other specified abnormal immunological findings in serum: Secondary | ICD-10-CM | POA: Diagnosis not present

## 2016-08-01 ENCOUNTER — Ambulatory Visit (INDEPENDENT_AMBULATORY_CARE_PROVIDER_SITE_OTHER): Payer: Medicare Other | Admitting: Psychology

## 2016-08-01 DIAGNOSIS — F4312 Post-traumatic stress disorder, chronic: Secondary | ICD-10-CM

## 2016-08-07 NOTE — Progress Notes (Deleted)
[name] [name] was seen today in neurologic consultation at the request of Mable Paris, [place].   The patient presents today for consultation regarding abnormal MRI of the brain.  This was done because of headache.  The patient is a 61 y.o. year old male with a history of headache.  The patient reports that headache began about 3 months ago.  The location of the headache is in the posterior occiput.  He didn't consider himself a headachey type of person before 3 months ago.  It feels like a sledgehammer.  There is photophobia.  There is lightheadedness and he doesn't drive because of it.  There is no phonophobia.  The patient reports that there is no associated nausea or emesis. He reports soreness of the occiput to the touch.  The headache radiates to the frontal region bilaterally.  The patient reports no autonomic features such as rhinorrhea, conjunctival erythema, or tearing of the eyes.  Balance has not changed.  He doesn't work outside of the home and states that he is disabled from neuropathy and sleep apnea.  Admits to occasional diplopia but states that he had bells palsy 4 different times and has chronic L facial droop and diplopia started then.  States that he first had bells in his 67's and last episode was 2 years ago.  States that he is taking flexeril for the headaches and takes 2 per day.  Admits to [facility] and wears CPAP faithfully.  Had it adjusted 1 year ago and states that pressure decreased from 18 to 14.    I had the opportunity to review the patient's MRI of the brain with and without gadolinium done on 09/30/2015.  There was mild temporal atrophy and a few rare scattered T2 hyperintensities.  The radiology report said that the ventricles were slightly enlarged, but admitted that it could be ex vacuo, but also said could not rule out NPH.  02/01/16 update:  The patient follows up today.  I have reviewed numerous records since last visit.  He had an occipital nerve block on March 21 and  April 3 and these did not provide long-term benefit.  On April 10 and started him on Topamax.  He called me 2 weeks later to state that it did not help.  I explained to the patient that he needed to give it much more time.  The patient states that headaches are much better but admits that he is only taking topamax prn.  He rarely has headache.   I did do lab work several months ago at our last visit and there were several abnormalities.  His sedimentation rate was very high at 90.  He was sent to the rheumatology office and this was repeated and it was within ranges of normal, but his CRP was very elevated at 77.  He was diagnosed with psoriatic arthritis at the rheumatology office and started on Remicade.  His rash is much better and joint pain is much better.  Since our last visit, his brother was found dead in his home which was obviously distressing for him.  He apparently had been dead for quite some time.  He was 61 y/o.    08/09/16 update: Patient comes in today to follow-up after an emergency room visit on 07/02/2016.  I reviewed those records.  He first went to the emergency room after he had gone to his psychiatrist and his blood pressure was elevated with a systolic in the 371G and some headache along with "worsening  left facial droop" (patient has baseline left facial droop from history of Bell's palsy) and left arm tingling.  MRI was done and I reviewed that.  While there is a very small DWI positive area in the right occipital region, seen both on the axial and coronal images, this was not seen on the ADC mapping.  Antiplatelet therapy was recommended.  However, neurology felt that this likely represented complicated migraine, as opposed to stroke.   ALLERGIES:   Allergies  Allergen Reactions  . Contrast Media [Iodinated Diagnostic Agents] Rash and Other (See Comments)    Got very hot and red   . Glipizide Other (See Comments)    ANTIDIABETICS. Burning  . Metrizamide Other (See Comments)     Got very hot and red  . Penicillins Hives and Swelling    CURRENT MEDICATIONS:  Current Outpatient Prescriptions on File Prior to Visit  Medication Sig Dispense Refill  . albuterol (PROAIR HFA) 108 (90 Base) MCG/ACT inhaler Inhale 2 puffs into the lungs every 6 (six) hours as needed for shortness of breath.     [name] aspirin 81 MG tablet Take 81 mg by mouth daily.    [name] buPROPion (WELLBUTRIN XL) 150 MG 24 hr tablet Take 1 tablet (150 mg total) by mouth daily. 30 tablet 4  . busPIRone (BUSPAR) 10 MG tablet Take 2 tablets (20 mg total) by mouth 2 (two) times daily. 120 tablet 4  . carvedilol (COREG) 12.5 MG tablet Take 1 tablet (12.5 mg total) by mouth 2 (two) times daily. 60 tablet 11  . cholecalciferol (VITAMIN D) 1000 UNITS tablet Take 1,000 Units by mouth daily.      . Cinnamon 500 MG capsule Take 1,000 mg by mouth daily.    . clopidogrel (PLAVIX) 75 MG tablet Take 1 tablet (75 mg total) by mouth daily. 90 tablet 3  . cyclobenzaprine (FLEXERIL) 10 MG tablet take 1 tablet by mouth three times a day if needed for muscle spasm 30 tablet 0  . Glucosamine-Chondroit-Vit C-Mn (GLUCOSAMINE CHONDR 500 COMPLEX) CAPS Take 1 capsule by mouth daily.     . insulin NPH Human (NOVOLIN N RELION) 100 UNIT/ML injection Inject 40 units in am and 20 units in the evening (Patient taking differently: Inject 0-45 Units into the skin 2 (two) times daily as needed. ) 40 mL 2  . insulin regular (NOVOLIN R,HUMULIN R) 100 units/mL injection Inject 0.1-0.3 mLs (10-30 Units total) into the skin 3 (three) times daily before meals. (Patient taking differently: Inject 0-45 Units into the skin 2 (two) times daily as needed for high blood sugar. ) 40 mL 5  . ketoconazole (NIZORAL) 2 % cream apply to affected area AS NEEDED FOR IRRITATION  0  . losartan (COZAAR) 25 MG tablet take 1 tablet by mouth once daily 90 tablet 3  . magnesium gluconate (MAGONATE) 500 MG tablet Take 1,000 mg by mouth daily.    . Multiple Vitamin (MULTIVITAMIN)  capsule Take 1 capsule by mouth daily.      . nitroGLYCERIN (NITROSTAT) 0.4 MG SL tablet Place 1 tablet (0.4 mg total) under the tongue every 5 (five) minutes as needed. 25 tablet 1  . omeprazole (PRILOSEC) 20 MG capsule Take 1 capsule (20 mg total) by mouth 2 (two) times daily. (Patient taking differently: Take 20 mg by mouth 2 (two) times daily as needed (HEARTBURN). ) 20 capsule 0  . sertraline (ZOLOFT) 100 MG tablet Take 1.5 tablets (150 mg total) by mouth daily. 45 tablet 4  .  simvastatin (ZOCOR) 20 MG tablet take 1 tablet by mouth once daily AT 6PM 90 tablet 3  . tamsulosin (FLOMAX) 0.4 MG CAPS capsule take 2 capsule by mouth daily 60 capsule 11  . triamcinolone ointment (KENALOG) 0.1 % apply to affected area twice a day AVOID FACE, GROIN AND UNDERARMS  0   No current facility-administered medications on file prior to visit.     PAST MEDICAL HISTORY:   Past Medical History:  Diagnosis Date  . Adenomatous colon polyp   . Allergy   . Angina at [facility])    chronic  . Anxiety   . Arthritis   . Bell's palsy 2007  . Carotid artery occlusion   . Cataract    Dr. Dawna Part  . Coronary artery disease   . Depression   . Diabetes mellitus   . Diverticulosis   . Duodenitis   . DVT (deep venous thrombosis) ([facility])    in leg  . Gastropathy   . Heart murmur   . Helicobacter pylori gastritis   . Hyperlipidemia   . Hypertension   . Mild aortic stenosis   . Neuropathy ([place])   . Obesity   . Peripheral vascular disease ([place])   . Post splenectomy syndrome   . Psoriasis   . Sleep apnea   . SOB (shortness of breath)   . Stroke ([place])   . Tobacco use disorder    recently quit  . Tubular adenoma 08/2013   Dr. Hilarie Fredrickson  . Weak urinary stream     PAST SURGICAL HISTORY:   Past Surgical History:  Procedure Laterality Date  . CARDIAC CATHETERIZATION  08/2010   LAD: 80% ISR, RCA: 80% ostial, OM 80-90%  . CAROTID ENDARTERECTOMY  08/2010   left/ Dr. Kellie Simmering  . COLONOSCOPY    . CORONARY  ANGIOPLASTY     LAD: before CABG  . CORONARY ARTERY BYPASS GRAFT  09/06/2010   At [facility]: LIMA to LAD, left radial to RCA, sequential SVG to OM3 and 4  . heart stents  Jan 2011   leg stents 06/2009 and 03/2010  . PTA of illiac and SFA  multiple   Dr. Ronalee Belts, s/p revision 11/2012  . SPLENECTOMY      SOCIAL HISTORY:   Social History   Social History  . Marital status: Married    Spouse name: N/A  . Number of children: 0  . Years of education: N/A   Occupational History  .  Retired   Social History Main Topics  . Smoking status: Current Every Day Smoker    Packs/day: 0.75    Years: 40.00    Types: Cigarettes  . Smokeless tobacco: Never Used     Comment: smokes 2-3 cigarettes every 3 weeks  . Alcohol use No  . Drug use: No  . Sexual activity: No   Other Topics Concern  . Not on file   Social History Narrative   Married.      Lives in [place].       Retired    FAMILY HISTORY:   Family Status  Relation Status  . Father Deceased   lung cancer  . Sister Deceased   breast cancer  . Mother Alive   dementia, alzheimer's  . Brother Deceased at age 46   found dead in home  . Sister Deceased   seizures  . Paternal Grandfather     ROS:  A complete 10 system review of systems was obtained and was unremarkable apart from what is mentioned  above.  PHYSICAL EXAMINATION:    VITALS:   There were no vitals filed for this visit.  GEN:  Appears stated age and in NAD. HEENT:  Normocephalic, atraumatic. The mucous membranes are moist. The superficial temporal arteries are without ropiness or tenderness.  There is tenderness over bilateral occipital notches Cardiovascular: Regular rate and rhythm. Lungs: Clear to auscultation bilaterally. Neck: There are no carotid bruits noted bilaterally. Skin:  The prior psoriasis scales on the elbows are virtually gone.  NEUROLOGICAL: Orientation:  Pt is alert and oriented x 3.  Cranial nerves: There is mild L facial droop with  decreased blink on the L and decreased palpebral fissure on the L.  Speech is fluent and clear. Soft palate rises symmetrically and there is no tongue deviation. Hearing is intact to conversational tone. Tone: Tone is good throughout. Sensation: Sensation is intact to light touch and pinprick throughout (facial, extremities, trunk). Vibration is intact at the bilateral big toe but it is decreased. There is no extinction with double simultaneous stimulation. There is no sensory dermatomal level identified. Coordination:  The patient has no difficulty with RAM's or FNF bilaterally. Motor: Strength is 5/5 in the bilateral upper and lower extremities. Shoulder shrug is equal and symmetric.  There is no pronator drift.  There are no fasciculations noted. DTR's: Deep tendon reflexes are 1/4 at the bilateral biceps, triceps, brachioradialis, 2/4 at the bilateral patella and achilles.  There is bilateral striatal toe although they go down when the plantar surface is scratched. Gait and Station: The patient is able to ambulate without difficulty. The patient is not able to heel toe walk.  He is not able to ambulate in a tandem fashion. The patient is able to stand in the Romberg position.   LABS:  Lab Results  Component Value Date   WBC 7.6 07/02/2016   HGB 14.3 07/02/2016   HCT 42.0 07/02/2016   MCV 94.8 07/02/2016   PLT 150 07/02/2016     Chemistry      Component Value Date/Time   NA 139 07/02/2016 1120   NA 138 05/04/2014 0816   K 5.4 (H) 07/02/2016 1120   K 4.1 05/04/2014 0816   CL 106 07/02/2016 1120   CL 101 05/04/2014 0816   CO2 24 07/02/2016 1109   CO2 29 05/04/2014 0816   BUN 32 (H) 07/02/2016 1120   BUN 24 (H) 05/04/2014 0816   CREATININE 1.60 (H) 07/02/2016 1120   CREATININE 1.78 (H) 05/04/2014 0816      Component Value Date/Time   CALCIUM 8.9 07/02/2016 1109   CALCIUM 9.3 05/04/2014 0816   ALKPHOS 92 07/02/2016 1109   AST 14 (L) 07/02/2016 1109   ALT 13 (L) 07/02/2016 1109    BILITOT 0.6 07/02/2016 1109      Lab Results  Component Value Date   HGBA1C 5.9 06/11/2016    Lab Results  Component Value Date   ANA POS (A) 10/05/2015   RF 34 (H) 10/05/2015    Lab Results  Component Value Date   ANA POS (A) 10/05/2015    Lab Results  Component Value Date   ESRSEDRATE 90 (H) 10/05/2015   Lab Results  Component Value Date   CHOL 118 12/13/2015   HDL 24.60 (L) 12/13/2015   LDLCALC 55 12/13/2015   LDLDIRECT 64.9 06/01/2014   TRIG 190.0 (H) 12/13/2015   CHOLHDL 5 12/13/2015     IMPRESSIONS/PLAN:  1.  Headache with abnormal brain scan  -The patient certainly did have a  DWI positive lesion on his brain scan, but without ADC correlate.  I am not sure that this completely correlated with symptoms, but agreed with the neurologist at the hospital but I would recommend antiplatelet therapy.  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   - We discussed signs and sx's of stroke and importance of calling 911 immediately should he have any of these.  Patient education was provided.    -Talked about stroke risk factors which include DM, HTN, hyperlipidemia, previous hx of stroke.  Talked about those risk factors which are modifiable.  -Talked about importance of blood pressure control with a goal <130/80 mm Hg.   -Talked about importance of lipid control and proper diet.  Lipids should be managed intensively, with a goal LDL < 70 mg/dL.  Fasting lipids need to be rechecked.  Currently on zocor 20 mg daily  -I counseled the patient on measures to reduce stroke risk, including the importance of medication compliance, risk factor control, exercise, healthy diet, and avoidance of smoking.     2.  Psoriatic arthritis  -on remicade.  Seeing Dr. Trudie Reed and doing markedly better  3.  F/u prn

## 2016-08-08 ENCOUNTER — Ambulatory Visit: Payer: Medicare Other | Admitting: Psychology

## 2016-08-09 ENCOUNTER — Ambulatory Visit: Payer: Self-pay | Admitting: Neurology

## 2016-08-13 NOTE — Progress Notes (Signed)
[name] [name] was seen today in neurologic consultation at the request of [name] [name], [place].   The patient presents today for consultation regarding abnormal MRI of the brain.  This was done because of headache.  The patient is a 61 y.o. year old male with a history of headache.  The patient reports that headache began about 3 months ago.  The location of the headache is in the posterior occiput.  He didn't consider himself a headachey type of person before 3 months ago.  It feels like a sledgehammer.  There is photophobia.  There is lightheadedness and he doesn't drive because of it.  There is no phonophobia.  The patient reports that there is no associated nausea or emesis. He reports soreness of the occiput to the touch.  The headache radiates to the frontal region bilaterally.  The patient reports no autonomic features such as rhinorrhea, conjunctival erythema, or tearing of the eyes.  Balance has not changed.  He doesn't work outside of the home and states that he is disabled from neuropathy and sleep apnea.  Admits to occasional diplopia but states that he had bells palsy 4 different times and has chronic L facial droop and diplopia started then.  States that he first had bells in his 72's and last episode was 2 years ago.  States that he is taking flexeril for the headaches and takes 2 per day.  Admits to [facility] and wears CPAP faithfully.  Had it adjusted 1 year ago and states that pressure decreased from 18 to 14.    I had the opportunity to review the patient's MRI of the brain with and without gadolinium done on 09/30/2015.  There was mild temporal atrophy and a few rare scattered T2 hyperintensities.  The radiology report said that the ventricles were slightly enlarged, but admitted that it could be ex vacuo, but also said could not rule out NPH.  02/01/16 update:  The patient follows up today.  I have reviewed numerous records since last visit.  He had an occipital nerve block on March 21 and  April 3 and these did not provide long-term benefit.  On April 10 and started him on Topamax.  He called me 2 weeks later to state that it did not help.  I explained to the patient that he needed to give it much more time.  The patient states that headaches are much better but admits that he is only taking topamax prn.  He rarely has headache.   I did do lab work several months ago at our last visit and there were several abnormalities.  His sedimentation rate was very high at 90.  He was sent to the rheumatology office and this was repeated and it was within ranges of normal, but his CRP was very elevated at 77.  He was diagnosed with psoriatic arthritis at the rheumatology office and started on Remicade.  His rash is much better and joint pain is much better.  Since our last visit, his brother was found dead in his home which was obviously distressing for him.  He apparently had been dead for quite some time.  He was 61 y/o.    08/15/16 update: Patient comes in today to follow-up after an emergency room visit on 07/02/2016.  I reviewed those records.  He first went to the emergency room after he had gone to his psychiatrist and his blood pressure was elevated with a systolic in the 409B and some headache along with "worsening  left facial droop" (patient has baseline left facial droop from history of Bell's palsy) and left arm tingling.  Pt completely denies there was any worsening facial droop today, stating that he told the ER that there was stable facial droop from bells palsy.  He denies headache as well and states that he was only there because his BP was high at his doctors office and he was told to go to the hospital because of that.   States that headache did develop in the ER from being frustrated with lack of care.  Admits to L arm tingling but that has been going on a year, off and on and he thinks that is related to a pinched nerve.  It involves the entire arm and all the fingers.  "I had it all day  yesterday."  MRI was done and I reviewed that.  While there is a very small DWI positive area in the right occipital region, seen both on the axial and coronal images, this was not seen on the ADC mapping.  Antiplatelet therapy was recommended.  He was already on an ASA, 81 mg daily faithfully.  He was also on Plavix at the time.  Pt states that it wasn't changed at the hospital.  However, neurology felt that this likely represented complicated migraine, as opposed to stroke.  He does have hx of headache.  Were gone for a while.  Now in the bilateral temporal region.  Feels throbbing.  Off of topamax.  Was on 100 mg daily.     ALLERGIES:   Allergies  Allergen Reactions  . Contrast Media [Iodinated Diagnostic Agents] Rash and Other (See Comments)    Got very hot and red   . Glipizide Other (See Comments)    ANTIDIABETICS. Burning  . Metrizamide Other (See Comments)    Got very hot and red  . Penicillins Hives and Swelling    CURRENT MEDICATIONS:  Current Outpatient Prescriptions on File Prior to Visit  Medication Sig Dispense Refill  . albuterol (PROAIR HFA) 108 (90 Base) MCG/ACT inhaler Inhale 2 puffs into the lungs every 6 (six) hours as needed for shortness of breath.     [name] aspirin 81 MG tablet Take 81 mg by mouth daily.    [name] buPROPion (WELLBUTRIN XL) 150 MG 24 hr tablet Take 1 tablet (150 mg total) by mouth daily. 30 tablet 4  . busPIRone (BUSPAR) 10 MG tablet Take 2 tablets (20 mg total) by mouth 2 (two) times daily. 120 tablet 4  . carvedilol (COREG) 12.5 MG tablet Take 1 tablet (12.5 mg total) by mouth 2 (two) times daily. 60 tablet 11  . cholecalciferol (VITAMIN D) 1000 UNITS tablet Take 1,000 Units by mouth daily.      . Cinnamon 500 MG capsule Take 1,000 mg by mouth daily.    . clopidogrel (PLAVIX) 75 MG tablet Take 1 tablet (75 mg total) by mouth daily. 90 tablet 3  . cyclobenzaprine (FLEXERIL) 10 MG tablet take 1 tablet by mouth three times a day if needed for muscle spasm 30  tablet 0  . Glucosamine-Chondroit-Vit C-Mn (GLUCOSAMINE CHONDR 500 COMPLEX) CAPS Take 1 capsule by mouth daily.     . insulin NPH Human (NOVOLIN N RELION) 100 UNIT/ML injection Inject 40 units in am and 20 units in the evening (Patient taking differently: Inject 0-45 Units into the skin 2 (two) times daily as needed. ) 40 mL 2  . insulin regular (NOVOLIN R,HUMULIN R) 100 units/mL injection Inject 0.1-0.3 mLs (  10-30 Units total) into the skin 3 (three) times daily before meals. (Patient taking differently: Inject 0-45 Units into the skin 2 (two) times daily as needed for high blood sugar. ) 40 mL 5  . ketoconazole (NIZORAL) 2 % cream apply to affected area AS NEEDED FOR IRRITATION  0  . losartan (COZAAR) 25 MG tablet take 1 tablet by mouth once daily 90 tablet 3  . magnesium gluconate (MAGONATE) 500 MG tablet Take 1,000 mg by mouth daily.    . Multiple Vitamin (MULTIVITAMIN) capsule Take 1 capsule by mouth daily.      . nitroGLYCERIN (NITROSTAT) 0.4 MG SL tablet Place 1 tablet (0.4 mg total) under the tongue every 5 (five) minutes as needed. 25 tablet 1  . omeprazole (PRILOSEC) 20 MG capsule Take 1 capsule (20 mg total) by mouth 2 (two) times daily. (Patient taking differently: Take 20 mg by mouth 2 (two) times daily as needed (HEARTBURN). ) 20 capsule 0  . sertraline (ZOLOFT) 100 MG tablet Take 1.5 tablets (150 mg total) by mouth daily. 45 tablet 4  . simvastatin (ZOCOR) 20 MG tablet take 1 tablet by mouth once daily AT 6PM 90 tablet 3  . tamsulosin (FLOMAX) 0.4 MG CAPS capsule take 2 capsule by mouth daily 60 capsule 11  . triamcinolone ointment (KENALOG) 0.1 % apply to affected area twice a day AVOID FACE, GROIN AND UNDERARMS  0   No current facility-administered medications on file prior to visit.     PAST MEDICAL HISTORY:   Past Medical History:  Diagnosis Date  . Adenomatous colon polyp   . Allergy   . Angina at rest [facility])    chronic  . Anxiety   . Arthritis   . Bell's palsy 2007  .  Carotid artery occlusion   . Cataract    Dr. Dawna Part  . Coronary artery disease   . Depression   . Diabetes mellitus   . Diverticulosis   . Duodenitis   . DVT (deep venous thrombosis) ([facility])    in leg  . Gastropathy   . Heart murmur   . Helicobacter pylori gastritis   . Hyperlipidemia   . Hypertension   . Mild aortic stenosis   . Neuropathy ([place])   . Obesity   . Peripheral vascular disease ([place])   . Post splenectomy syndrome   . Psoriasis   . Sleep apnea   . SOB (shortness of breath)   . Stroke ([place])   . Tobacco use disorder    recently quit  . Tubular adenoma 08/2013   Dr. Hilarie Fredrickson  . Weak urinary stream     PAST SURGICAL HISTORY:   Past Surgical History:  Procedure Laterality Date  . CARDIAC CATHETERIZATION  08/2010   LAD: 80% ISR, RCA: 80% ostial, OM 80-90%  . CAROTID ENDARTERECTOMY  08/2010   left/ Dr. Kellie Simmering  . COLONOSCOPY    . CORONARY ANGIOPLASTY     LAD: before CABG  . CORONARY ARTERY BYPASS GRAFT  09/06/2010   At [facility]: LIMA to LAD, left radial to RCA, sequential SVG to OM3 and 4  . heart stents  Jan 2011   leg stents 06/2009 and 03/2010  . PTA of illiac and SFA  multiple   Dr. Ronalee Belts, s/p revision 11/2012  . SPLENECTOMY      SOCIAL HISTORY:   Social History   Social History  . Marital status: Married    Spouse name: N/A  . Number of children: 0  . Years of education:  N/A   Occupational History  .  Retired   Social History Main Topics  . Smoking status: Current Every Day Smoker    Packs/day: 0.75    Years: 40.00    Types: Cigarettes  . Smokeless tobacco: Never Used     Comment: smokes 2-3 cigarettes every 3 weeks  . Alcohol use No  . Drug use: No  . Sexual activity: No   Other Topics Concern  . Not on file   Social History Narrative   Married.      Lives in [place].       Retired    FAMILY HISTORY:   Family Status  Relation Status  . Father Deceased   lung cancer  . Sister Deceased   breast cancer  . Mother Alive   dementia,  alzheimer's  . Brother Deceased at age 33   found dead in home  . Sister Deceased   seizures  . Paternal Grandfather     ROS:  A complete 10 system review of systems was obtained and was unremarkable apart from what is mentioned above.  PHYSICAL EXAMINATION:    VITALS:   There were no vitals filed for this visit.  GEN:  Appears stated age and in NAD. HEENT:  Normocephalic, atraumatic. The mucous membranes are moist. The superficial temporal arteries are without ropiness or tenderness.  There is tenderness over bilateral occipital notches Cardiovascular: Regular rate and rhythm. Lungs: Clear to auscultation bilaterally. Neck: There are no carotid bruits noted bilaterally. Skin:  The prior psoriasis scales on the elbows are virtually gone.  NEUROLOGICAL: Orientation:  Pt is alert and oriented x 3.  Cranial nerves: There is mild L facial droop with decreased blink on the L and decreased palpebral fissure on the L.  Speech is fluent and clear. Soft palate rises symmetrically and there is no tongue deviation. Hearing is intact to conversational tone. Tone: Tone is good throughout. Sensation: Sensation is intact to light touch and pinprick throughout (facial, extremities, trunk). Vibration is intact at the bilateral big toe but it is decreased. There is no extinction with double simultaneous stimulation. There is no sensory dermatomal level identified. Coordination:  The patient has no difficulty with RAM's or FNF bilaterally. Motor: Strength is 5/5 in the bilateral upper and lower extremities. Shoulder shrug is equal and symmetric.  There is no pronator drift.  There are no fasciculations noted. DTR's: Deep tendon reflexes are 1/4 at the bilateral biceps, triceps, brachioradialis, 2/4 at the bilateral patella and achilles.  There is bilateral striatal toe although they go down when the plantar surface is scratched. Gait and Station: The patient is able to ambulate without difficulty. The  patient is not able to heel toe walk.  He is not able to ambulate in a tandem fashion. The patient is able to stand in the Romberg position.   LABS:  Lab Results  Component Value Date   WBC 7.6 07/02/2016   HGB 14.3 07/02/2016   HCT 42.0 07/02/2016   MCV 94.8 07/02/2016   PLT 150 07/02/2016     Chemistry      Component Value Date/Time   NA 139 07/02/2016 1120   NA 138 05/04/2014 0816   K 5.4 (H) 07/02/2016 1120   K 4.1 05/04/2014 0816   CL 106 07/02/2016 1120   CL 101 05/04/2014 0816   CO2 24 07/02/2016 1109   CO2 29 05/04/2014 0816   BUN 32 (H) 07/02/2016 1120   BUN 24 (H) 05/04/2014 8250  CREATININE 1.60 (H) 07/02/2016 1120   CREATININE 1.78 (H) 05/04/2014 0816      Component Value Date/Time   CALCIUM 8.9 07/02/2016 1109   CALCIUM 9.3 05/04/2014 0816   ALKPHOS 92 07/02/2016 1109   AST 14 (L) 07/02/2016 1109   ALT 13 (L) 07/02/2016 1109   BILITOT 0.6 07/02/2016 1109      Lab Results  Component Value Date   HGBA1C 5.9 06/11/2016    Lab Results  Component Value Date   ANA POS (A) 10/05/2015   RF 34 (H) 10/05/2015    Lab Results  Component Value Date   ANA POS (A) 10/05/2015    Lab Results  Component Value Date   ESRSEDRATE 90 (H) 10/05/2015   Lab Results  Component Value Date   CHOL 118 12/13/2015   HDL 24.60 (L) 12/13/2015   LDLCALC 55 12/13/2015   LDLDIRECT 64.9 06/01/2014   TRIG 190.0 (H) 12/13/2015   CHOLHDL 5 12/13/2015     IMPRESSIONS/PLAN:  1. Abnormal brain scan  -The patient certainly did have a DWI positive lesion on his brain scan, but without ADC correlate.  I am not sure that this completely correlated with symptoms, but agreed with the neurologist at the hospital but I would recommend antiplatelet therapy.  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   - We discussed signs and sx's of stroke and importance of calling 911 immediately should he have any of these.  Patient education was provided.     -Talked about stroke risk factors which include DM, HTN, hyperlipidemia, previous hx of stroke, sleep apnea.  Talked about those risk factors which are modifiable.  -Talked about importance of blood pressure control with a goal <130/80 mm Hg.   -Talked about importance of lipid control and proper diet.  Lipids should be managed intensively, with a goal LDL < 70 mg/dL.  Fasting lipids need to be rechecked but last LDL is May only 55.  Currently on zocor 20 mg daily.    -I counseled the patient on measures to reduce stroke risk, including the importance of medication compliance, risk factor control, exercise, healthy diet, and avoidance of smoking.  His HDL was only 24.6 and talked about raising that through exercise.    -carotid u/s done in 03/2016 demonstrated 40-59% on the right.  He already has yearly appt set up for sept.  -has sleep apnea which is risk factor and he is very faithful with that, even when using taking naps.   2.  Psoriatic arthritis  -on remicade.  Seeing Dr. Trudie Reed and doing markedly better  3.  Headache  -restart topamax and will work to 100 mg in the AM, 50 mg in the afternoon.  Risks, benefits, side effects and alternative therapies were discussed.  The opportunity to ask questions was given and they were answered to the best of my ability.  The patient expressed understanding and willingness to follow the outlined treatment protocols.  3.  F/u 4-5 months.  Much greater than 50% of this visit was spent in counseling and coordinating care.  Total face to face time:  45 min

## 2016-08-15 ENCOUNTER — Encounter: Payer: Self-pay | Admitting: Neurology

## 2016-08-15 ENCOUNTER — Ambulatory Visit (INDEPENDENT_AMBULATORY_CARE_PROVIDER_SITE_OTHER): Payer: Medicare Other | Admitting: Neurology

## 2016-08-15 VITALS — BP 130/80 | HR 76 | Ht 67.0 in | Wt 258.0 lb

## 2016-08-15 DIAGNOSIS — G4733 Obstructive sleep apnea (adult) (pediatric): Secondary | ICD-10-CM | POA: Diagnosis not present

## 2016-08-15 DIAGNOSIS — I6521 Occlusion and stenosis of right carotid artery: Secondary | ICD-10-CM | POA: Diagnosis not present

## 2016-08-15 DIAGNOSIS — E785 Hyperlipidemia, unspecified: Secondary | ICD-10-CM | POA: Diagnosis not present

## 2016-08-15 DIAGNOSIS — R9402 Abnormal brain scan: Secondary | ICD-10-CM | POA: Diagnosis not present

## 2016-08-15 DIAGNOSIS — Z5181 Encounter for therapeutic drug level monitoring: Secondary | ICD-10-CM

## 2016-08-15 MED ORDER — TOPIRAMATE 50 MG PO TABS: ORAL_TABLET | ORAL | 5 refills | Status: DC

## 2016-08-15 MED ORDER — TOPIRAMATE 25 MG PO TABS: ORAL_TABLET | ORAL | 0 refills | Status: DC

## 2016-08-15 NOTE — Patient Instructions (Signed)
1. Come to our office tomorrow to check into the lab before your appt with [facility].   2. Start Topamax as follows:  Topamax 25 mg tablets: Take one tablet at night for one week Then take one tablet in the morning, one tablet at night for one week Then take one tablet in the morning, two tablets at night for one week Then take two tablets in the morning, two tablets at night for one week Then take two tablets in the morning, three tablets at night for one week Then take two tablets in the morning, four tablets in the evening  Topamax 25 mg tablet RX sent to pharmacy. After you finish titration, another prescription for Topamax 50 mg tablets has been sent to your pharmacy as well. You can switch to this dosage at one tablet in the morning and two in the evening.   3. Follow up in 4-5 months.

## 2016-08-15 NOTE — Addendum Note (Signed)
Addended byAnnamaria Helling on: 08/15/2016 09:53 AM   Modules accepted: Orders

## 2016-08-16 ENCOUNTER — Telehealth: Payer: Self-pay | Admitting: Neurology

## 2016-08-16 ENCOUNTER — Other Ambulatory Visit (INDEPENDENT_AMBULATORY_CARE_PROVIDER_SITE_OTHER): Payer: Medicare Other

## 2016-08-16 ENCOUNTER — Ambulatory Visit (INDEPENDENT_AMBULATORY_CARE_PROVIDER_SITE_OTHER): Payer: Medicare Other | Admitting: Internal Medicine

## 2016-08-16 ENCOUNTER — Other Ambulatory Visit: Payer: Self-pay

## 2016-08-16 ENCOUNTER — Encounter: Payer: Self-pay | Admitting: Internal Medicine

## 2016-08-16 VITALS — BP 124/82 | HR 69 | Ht 67.0 in | Wt 257.0 lb

## 2016-08-16 DIAGNOSIS — I6521 Occlusion and stenosis of right carotid artery: Secondary | ICD-10-CM | POA: Diagnosis not present

## 2016-08-16 DIAGNOSIS — E785 Hyperlipidemia, unspecified: Secondary | ICD-10-CM

## 2016-08-16 DIAGNOSIS — E1151 Type 2 diabetes mellitus with diabetic peripheral angiopathy without gangrene: Secondary | ICD-10-CM | POA: Diagnosis not present

## 2016-08-16 DIAGNOSIS — Z794 Long term (current) use of insulin: Secondary | ICD-10-CM

## 2016-08-16 DIAGNOSIS — Z5181 Encounter for therapeutic drug level monitoring: Secondary | ICD-10-CM | POA: Diagnosis not present

## 2016-08-16 LAB — LDL CHOLESTEROL, DIRECT: Direct LDL: 54 mg/dL

## 2016-08-16 LAB — LIPID PANEL
Cholesterol: 118 mg/dL (ref 0–200)
HDL: 29.6 mg/dL — ABNORMAL LOW (ref >39.00)
NonHDL: 88.07
Total CHOL/HDL Ratio: 4
Triglycerides: 227 mg/dL — ABNORMAL HIGH (ref 0.0–149.0)
VLDL: 45.4 mg/dL — ABNORMAL HIGH (ref 0.0–40.0)

## 2016-08-16 MED ORDER — ACCU-CHEK FASTCLIX LANCETS MISC: 3 refills | Status: DC

## 2016-08-16 NOTE — Patient Instructions (Signed)
Please continue:  Insulin Before breakfast Before lunch Before dinner  Regular - clear (short acting) 20 units - smaller meal 25 units - regular meal 30 units - larger meal  10 units - smaller meal 15 units - regular meal 20 units - larger meal  NPH - cloudy (long acting) 40  20   Please come back for a follow-up appointment in 3 months.

## 2016-08-16 NOTE — Telephone Encounter (Signed)
-----   Message from [place], DO sent at 08/16/2016  2:56 PM EST ----- Let pt know that TG's are up but cholesterol looks ok.  Have him f/u with PCP.  As told him yesterday, the good cholesterol is low and needs to exercise to get that higher

## 2016-08-16 NOTE — Progress Notes (Signed)
Patient ID: [name] [name], male   DOB: [birth year], 61 y.o.   MRN: [id number]  HPI: [name] [name] is a 61 y.o.-year-old male, returning for f/u for DM2, dx 2006, insulin-dependent since 2011, uncontrolled, with complications (CAD - CABG 2012, PVD - s/p Stents, Aortic and carotid stenosis, peripheral neuropathy). Last visit 3 mo ago.  He had flu this season.  He also had Bell's palsy.  Last hemoglobin A1c: Lab Results  Component Value Date   HGBA1C 5.9 06/11/2016   HGBA1C 6.2 03/06/2016   HGBA1C 6.5 12/13/2015   We stopped Victoza at a previous visit. He could not afford his insulins (doughnut hole) - 400$.   He is now on: N and R insulins:  Insulin Before breakfast Before lunch Before dinner  Regular - clear (short acting) 20 units - smaller meal 25 units - regular meal 30 units - larger meal  10 units - smaller meal 15 units - regular meal 20 units - larger meal  NPH - cloudy (long acting) 40  20    Pt checks his sugars 3x a day and they are slightly higher (reviewed log): - am: 88-166, 196, 309 >> 89-154, 193 >> 69, 82-143 >> 78, 86-145 >> 80-139 >> 81-131 >> 92, 102-147, 177 - 2h after b'fast: 139-148 >> n/c >> 161 >> n/c >> 138 >> 219 >> 137-162, 198 >> 145-158 >> 173 - before lunch: 70, 79, 123-167, 220 >> 133, 179 >> 119-158 >> n/c >> 152-180, 226 >> n/c >> 132-190, 220 - 2h after lunch: 93-179, 217 >> 101-165, 187 >> 94-170, 196 >> 82-167, 215 >> 85-177 >> 75-147, 191 - before dinner: n/c >> 101-192 >> 122-147 >> 69-137 >> 175 >> n/c >> n/c >> 164 - 2h after dinner:39 x1; 100-143 >> 98-143, 197, 210 >> 115-189, 242 >> 89-182, 251 >> 94-201, 225 >> 110-147, 226 - bedtime: 106-129 >> 80-135 >> 101-154 >> 79-134 >> 89-134 >> 109-139 >> n/c >> 130, 169 No lows. Lowest sugar was 80 >> 50 x1 - before lunch, 69 x1 - after lunch >> 75; ? hypoglycemia awareness. Highest sugar was 226 >> 242 >> 226  Pt's meals are: - Breakfast: toast + eggs or cereals + milk - Lunch: soup +  1/2 sandwich - Dinner: meat + sweet potatoes or pasta + salad - Snacks: 3: sugar free foods, fruit He quit drinking sodas, but drinks tea - artificial sweetener. Also Almond milk.  - He has CKD, last BUN/creatinine:  Lab Results  Component Value Date   BUN 32 (H) 07/02/2016   CREATININE 1.60 (H) 07/02/2016  He is on Cozaar. Sees nephrology. Has a stone in L kidney, has 3 cysts in R kidney. - last set of lipids: Lab Results  Component Value Date   CHOL 118 12/13/2015   HDL 24.60 (L) 12/13/2015   LDLCALC 55 12/13/2015   LDLDIRECT 64.9 06/01/2014   TRIG 190.0 (H) 12/13/2015   CHOLHDL 5 12/13/2015  He  Is on Zocor. - last eye exam was in 11/2015 [facility] DR. + cataracts. - + numbness and tingling in his feet -  [facility]) - Dr Elvina Mattes.   He has pain in L leg >> surgery for PVD stents on 04/27/2014. His sugars improved after the stent.   I reviewed pt's medications, allergies, PMH, social hx, family hx, and changes were documented in the history of present illness. Otherwise, unchanged from my initial visit note.   ROS: Constitutional:  No weight gain or  loss, no fatigue, + subjective hypothermia  Eyes: no blurry vision, no xerophthalmia ENT: + sore throat, no nodules palpated in throat, no dysphagia/no odynophagia, no hoarseness, + tinnitus Cardiovascular: no CP/+ palpitations/no leg swelling Respiratory: + cough/+ SOB Gastrointestinal: no N/V/D/C Musculoskeletal: no muscle/+ joint aches Skin: + rash - face - chronic Neurological: no tremors/numbness/tingling/dizziness, + HA  PE: BP 124/82 (BP Location: Left Arm, Patient Position: Sitting)   Pulse 69   Ht 5\' 7"  (1.702 m)   Wt 257 lb (116.6 kg)   SpO2 97%   BMI 40.25 kg/m  Body mass index is 40.25 kg/m. Wt Readings from Last 3 Encounters:  08/16/16 257 lb (116.6 kg)  08/15/16 258 lb (117 kg)  07/09/16 257 lb 8 oz (116.8 kg)   Constitutional: obese, in NAD, ruddy complexion Eyes: PERRLA, EOMI,  no exophthalmos ENT: dry mucous membranes, no thyromegaly, no cervical lymphadenopathy Cardiovascular: RRR, +3/6 SEM, no RG Respiratory: CTA B Gastrointestinal: abdomen soft, NT, ND, BS+ Musculoskeletal: no deformities, strength intact in all 4 Skin: moist, warm, + rash - rosacea on face  Neurological: no tremor with outstretched hands, DTR normal in all 4  ASSESSMENT: 1. DM2, insulin-dependent, now controlled, with complications - CAD - s/p CABG 2012 - PVD - s/p stents 2012 - Aortic stenosis - carotid stenosis - CKD - peripheral neuropathy  - He read Dr Janene Harvey book: Program for Reversing Diabetes".  PLAN:  1. Patient with long-standing, now controlled diabetes, on injectable regimen. Diabetes has improved lately (last HbA1c was 5.9% at last check!) but sugars are higher now over the Holidays and 2/2 to his flu. Will not change regimen since no lows. - I advised him to Patient Instructions  Please continue:  Insulin Before breakfast Before lunch Before dinner  Regular - clear (short acting) 20 units - smaller meal 25 units - regular meal 30 units - larger meal  10 units - smaller meal 15 units - regular meal 20 units - larger meal  NPH - cloudy (long acting) 40  20   Please come back for a follow-up appointment in 3 months.  - continue to check 3 times a day, rotating checks - up to date with eye exams - Return to clinic in 3 mo with sugar log   Philemon Kingdom, MD PhD [facility]

## 2016-08-17 NOTE — Telephone Encounter (Signed)
Patient made aware.

## 2016-08-20 DIAGNOSIS — M0579 Rheumatoid arthritis with rheumatoid factor of multiple sites without organ or systems involvement: Secondary | ICD-10-CM | POA: Diagnosis not present

## 2016-08-21 DIAGNOSIS — B351 Tinea unguium: Secondary | ICD-10-CM | POA: Diagnosis not present

## 2016-08-21 DIAGNOSIS — E1142 Type 2 diabetes mellitus with diabetic polyneuropathy: Secondary | ICD-10-CM | POA: Diagnosis not present

## 2016-08-24 ENCOUNTER — Ambulatory Visit (INDEPENDENT_AMBULATORY_CARE_PROVIDER_SITE_OTHER): Payer: Medicare Other | Admitting: Psychology

## 2016-08-24 DIAGNOSIS — F4312 Post-traumatic stress disorder, chronic: Secondary | ICD-10-CM | POA: Diagnosis not present

## 2016-08-29 ENCOUNTER — Ambulatory Visit (INDEPENDENT_AMBULATORY_CARE_PROVIDER_SITE_OTHER): Payer: Medicare Other | Admitting: Psychology

## 2016-08-29 DIAGNOSIS — F4312 Post-traumatic stress disorder, chronic: Secondary | ICD-10-CM | POA: Diagnosis not present

## 2016-09-05 ENCOUNTER — Ambulatory Visit (INDEPENDENT_AMBULATORY_CARE_PROVIDER_SITE_OTHER): Payer: Medicare Other | Admitting: Psychology

## 2016-09-05 DIAGNOSIS — F4312 Post-traumatic stress disorder, chronic: Secondary | ICD-10-CM

## 2016-09-12 ENCOUNTER — Ambulatory Visit (INDEPENDENT_AMBULATORY_CARE_PROVIDER_SITE_OTHER): Payer: Medicare Other | Admitting: Internal Medicine

## 2016-09-12 ENCOUNTER — Ambulatory Visit: Payer: Self-pay | Admitting: Family

## 2016-09-12 ENCOUNTER — Encounter: Payer: Self-pay | Admitting: Internal Medicine

## 2016-09-12 VITALS — BP 112/84 | HR 65 | Temp 97.7°F | Wt 251.0 lb

## 2016-09-12 DIAGNOSIS — J209 Acute bronchitis, unspecified: Secondary | ICD-10-CM

## 2016-09-12 DIAGNOSIS — J440 Chronic obstructive pulmonary disease with acute lower respiratory infection: Secondary | ICD-10-CM | POA: Diagnosis not present

## 2016-09-12 DIAGNOSIS — L4050 Arthropathic psoriasis, unspecified: Secondary | ICD-10-CM

## 2016-09-12 DIAGNOSIS — I6521 Occlusion and stenosis of right carotid artery: Secondary | ICD-10-CM | POA: Diagnosis not present

## 2016-09-12 DIAGNOSIS — G629 Polyneuropathy, unspecified: Secondary | ICD-10-CM

## 2016-09-12 DIAGNOSIS — F39 Unspecified mood [affective] disorder: Secondary | ICD-10-CM | POA: Diagnosis not present

## 2016-09-12 DIAGNOSIS — E1151 Type 2 diabetes mellitus with diabetic peripheral angiopathy without gangrene: Secondary | ICD-10-CM

## 2016-09-12 DIAGNOSIS — Z6841 Body Mass Index (BMI) 40.0 and over, adult: Secondary | ICD-10-CM | POA: Diagnosis not present

## 2016-09-12 DIAGNOSIS — Z794 Long term (current) use of insulin: Secondary | ICD-10-CM | POA: Diagnosis not present

## 2016-09-12 LAB — HM DIABETES FOOT EXAM

## 2016-09-12 MED ORDER — SIMVASTATIN 20 MG PO TABS: ORAL_TABLET | ORAL | 3 refills | Status: DC

## 2016-09-12 NOTE — Assessment & Plan Note (Signed)
Mild symptoms only No Rx

## 2016-09-12 NOTE — Assessment & Plan Note (Signed)
Has lost a lot of weight and is working on increasing his fitness program

## 2016-09-12 NOTE — Progress Notes (Signed)
Pre visit review using our clinic review tool, if applicable. No additional management support is needed unless otherwise documented below in the visit note. 

## 2016-09-12 NOTE — Assessment & Plan Note (Signed)
Chronic anxiety and depression  Continues with behavioral medicine follow up

## 2016-09-12 NOTE — Assessment & Plan Note (Signed)
Has had good control Continues with Dr Cruzita Lederer

## 2016-09-12 NOTE — Progress Notes (Signed)
Subjective:    Patient ID: [name] [name], male    DOB: [birth year], 61 y.o.   MRN: [id number]  HPI Here to establish care Had been seeing Dr Gilford Rile  Does have concern about BP being very high---when at her psychiatrist Martin Majestic to NP at [facility] office Sent to ER-- due to left facial droop (but known Bell's palsy) BP had been up --but better today  Went to see Dr Tat after the ER visit Found low HDL Trying to exercise--despite bad leg pain  Known diabetic neuropathy Keeps up with Dr Cruzita Lederer Control has been good  Also known vascular disease in feet Has seen Dr Odis Luster to walk and do aerobics No ulcers Still smokes some--- 1-2 per day only. Not willing to stop (smokes when he takes the dog out)  Sees psychiatrist and psychologist (Dr Rexene Edison) Chronic anxiety and depression Medications are doing well for this  He is not sure about COPD Does have regular cough and occasional sputum Does have DOE--seems stable  Sees Dr Trudie Reed for psoriatic arthritis On remicade and this has really helped  Current Outpatient Prescriptions on File Prior to Visit  Medication Sig Dispense Refill  . ACCU-CHEK FASTCLIX LANCETS MISC Use to check sugar 3 times daily. 102 each 3  . albuterol (PROAIR HFA) 108 (90 Base) MCG/ACT inhaler Inhale 2 puffs into the lungs every 6 (six) hours as needed for shortness of breath.     [name] aspirin 81 MG tablet Take 81 mg by mouth daily.    [name] buPROPion (WELLBUTRIN XL) 150 MG 24 hr tablet Take 1 tablet (150 mg total) by mouth daily. 30 tablet 4  . busPIRone (BUSPAR) 10 MG tablet Take 2 tablets (20 mg total) by mouth 2 (two) times daily. 120 tablet 4  . cholecalciferol (VITAMIN D) 1000 UNITS tablet Take 1,000 Units by mouth daily.      . Cinnamon 500 MG capsule Take 1,000 mg by mouth daily.    . clopidogrel (PLAVIX) 75 MG tablet Take 1 tablet (75 mg total) by mouth daily. 90 tablet 3  . cyclobenzaprine (FLEXERIL) 10 MG tablet take 1 tablet by mouth three times a  day if needed for muscle spasm 30 tablet 0  . Glucosamine-Chondroit-Vit C-Mn (GLUCOSAMINE CHONDR 500 COMPLEX) CAPS Take 1 capsule by mouth daily.     . insulin NPH Human (NOVOLIN N RELION) 100 UNIT/ML injection Inject 40 units in am and 20 units in the evening (Patient taking differently: Inject 0-45 Units into the skin 2 (two) times daily as needed. ) 40 mL 2  . insulin regular (NOVOLIN R,HUMULIN R) 100 units/mL injection Inject 0.1-0.3 mLs (10-30 Units total) into the skin 3 (three) times daily before meals. (Patient taking differently: Inject 0-45 Units into the skin 2 (two) times daily as needed for high blood sugar. ) 40 mL 5  . ketoconazole (NIZORAL) 2 % cream apply to affected area AS NEEDED FOR IRRITATION  0  . losartan (COZAAR) 25 MG tablet take 1 tablet by mouth once daily 90 tablet 3  . magnesium gluconate (MAGONATE) 500 MG tablet Take 1,000 mg by mouth daily.    . Multiple Vitamin (MULTIVITAMIN) capsule Take 1 capsule by mouth daily.      . nitroGLYCERIN (NITROSTAT) 0.4 MG SL tablet Place 1 tablet (0.4 mg total) under the tongue every 5 (five) minutes as needed. 25 tablet 1  . omeprazole (PRILOSEC) 20 MG capsule Take 1 capsule (20 mg total) by mouth 2 (two) times daily. (Patient  taking differently: Take 20 mg by mouth 2 (two) times daily as needed (HEARTBURN). ) 20 capsule 0  . sertraline (ZOLOFT) 100 MG tablet Take 1.5 tablets (150 mg total) by mouth daily. 45 tablet 4  . simvastatin (ZOCOR) 20 MG tablet take 1 tablet by mouth once daily AT 6PM 90 tablet 3  . tamsulosin (FLOMAX) 0.4 MG CAPS capsule take 2 capsule by mouth daily 60 capsule 11  . topiramate (TOPAMAX) 50 MG tablet 1 tablet in the morning, 2 tablets at night 90 tablet 5  . triamcinolone ointment (KENALOG) 0.1 % apply to affected area twice a day AVOID FACE, GROIN AND UNDERARMS  0  . carvedilol (COREG) 12.5 MG tablet Take 1 tablet (12.5 mg total) by mouth 2 (two) times daily. 60 tablet 11   No current facility-administered  medications on file prior to visit.     Allergies  Allergen Reactions  . Contrast Media [Iodinated Diagnostic Agents] Rash and Other (See Comments)    Got very hot and red   . Glipizide Other (See Comments)    ANTIDIABETICS. Burning  . Metrizamide Other (See Comments)    Got very hot and red  . Penicillins Hives and Swelling    Past Medical History:  Diagnosis Date  . Adenomatous colon polyp   . Allergy   . Angina at rest [facility])    chronic  . Anxiety   . Arthritis   . Bell's palsy 2007  . Carotid artery occlusion   . Cataract    Dr. Dawna Part  . Coronary artery disease   . Depression   . Diabetes mellitus   . Diverticulosis   . Duodenitis   . DVT (deep venous thrombosis) (HCC)    in leg  . Gastropathy   . Heart murmur   . Helicobacter pylori gastritis   . Hyperlipidemia   . Hypertension   . Mild aortic stenosis   . Neuropathy ([place])   . Obesity   . Peripheral vascular disease ([place])   . Post splenectomy syndrome   . Psoriasis   . Sleep apnea   . SOB (shortness of breath)   . Stroke ([place])   . Tobacco use disorder    recently quit  . Tubular adenoma 08/2013   Dr. Hilarie Fredrickson  . Weak urinary stream     Past Surgical History:  Procedure Laterality Date  . CARDIAC CATHETERIZATION  08/2010   LAD: 80% ISR, RCA: 80% ostial, OM 80-90%  . CAROTID ENDARTERECTOMY  08/2010   left/ Dr. Kellie Simmering  . COLONOSCOPY    . CORONARY ANGIOPLASTY     LAD: before CABG  . CORONARY ARTERY BYPASS GRAFT  09/06/2010   At [facility]: LIMA to LAD, left radial to RCA, sequential SVG to OM3 and 4  . heart stents  Jan 2011   leg stents 06/2009 and 03/2010  . PTA of illiac and SFA  multiple   Dr. Ronalee Belts, s/p revision 11/2012  . SPLENECTOMY      Family History  Problem Relation Age of Onset  . Lung cancer Father 23  . Arthritis Father   . Breast cancer Sister 35  . Alcoholism Sister   . Dementia Mother   . Alzheimer's disease Mother   . Arthritis Mother   . Alcoholism Brother   . Epilepsy  Sister   . Diabetes Paternal Grandfather   . Colon polyps Paternal Grandfather 11  . Alcoholism Sister   . Alcoholism Sister   . Alcoholism Brother     Social  History   Social History  . Marital status: Married    Spouse name: N/A  . Number of children: 0  . Years of education: N/A   Occupational History  . 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 Smoking status: Light Tobacco Smoker    Packs/day: 0.75    Years: 40.00    Types: Cigarettes  . Smokeless tobacco: Never Used     Comment: smokes 2-3 cigarettes every 3 weeks  . Alcohol use No  . Drug use: No  . Sexual activity: No   Other Topics Concern  . Not on file   Social History Narrative  . No narrative on file   Review of Systems Weight stable now--went up a lot when he tried to stop smoking Sleep apnea-- uses CPAP nightly (14cm H20) Appetite is okay    Objective:   Physical Exam  Constitutional: No distress.  Neck: No thyromegaly present.  Cardiovascular: Normal rate and regular rhythm.  Exam reveals no gallop.   Gr 2/6 aortic systolic murmur Feet warm but no pulses  Pulmonary/Chest: Effort normal. No respiratory distress. He has no wheezes. He has no rales.  Decreased breath sounds but clear  Musculoskeletal: He exhibits no edema.  Lymphadenopathy:    He has no cervical adenopathy.  Neurological:  Decreased sensation in feet  Skin:  Mycotic toenails but no ulcers  Psychiatric: He has a normal mood and affect. His behavior is normal.          Assessment & Plan:

## 2016-09-12 NOTE — Assessment & Plan Note (Signed)
Doing well with the remicade

## 2016-09-12 NOTE — Assessment & Plan Note (Signed)
Diabetic and possibly vascular as well

## 2016-09-19 ENCOUNTER — Ambulatory Visit (INDEPENDENT_AMBULATORY_CARE_PROVIDER_SITE_OTHER): Payer: Medicare Other | Admitting: Psychology

## 2016-09-19 DIAGNOSIS — F4312 Post-traumatic stress disorder, chronic: Secondary | ICD-10-CM

## 2016-09-25 ENCOUNTER — Encounter: Payer: Self-pay | Admitting: Psychiatry

## 2016-09-25 ENCOUNTER — Ambulatory Visit (INDEPENDENT_AMBULATORY_CARE_PROVIDER_SITE_OTHER): Payer: Medicare Other | Admitting: Psychiatry

## 2016-09-25 VITALS — BP 166/80 | HR 79 | Temp 97.4°F | Wt 253.2 lb

## 2016-09-25 DIAGNOSIS — F411 Generalized anxiety disorder: Secondary | ICD-10-CM

## 2016-09-25 DIAGNOSIS — I6521 Occlusion and stenosis of right carotid artery: Secondary | ICD-10-CM | POA: Diagnosis not present

## 2016-09-25 DIAGNOSIS — F419 Anxiety disorder, unspecified: Secondary | ICD-10-CM

## 2016-09-25 DIAGNOSIS — F331 Major depressive disorder, recurrent, moderate: Secondary | ICD-10-CM

## 2016-09-25 MED ORDER — BUSPIRONE HCL 10 MG PO TABS: 20.0000 mg | ORAL_TABLET | Freq: Two times a day (BID) | ORAL | 4 refills | Status: DC

## 2016-09-25 MED ORDER — SERTRALINE HCL 100 MG PO TABS: 150.0000 mg | ORAL_TABLET | Freq: Every day | ORAL | 4 refills | Status: DC

## 2016-09-25 MED ORDER — BUPROPION HCL ER (XL) 300 MG PO TB24: 300.0000 mg | ORAL_TABLET | Freq: Every day | ORAL | 2 refills | Status: DC

## 2016-09-25 NOTE — Progress Notes (Signed)
Patient ID: [name] [name], male   DOB: 1955/09/25, 61 y.o.   MRN: [id number] [facility] MD/PA/NP OP Progress Note  09/25/2016 8:45 AM [name] [name]  MRN:  [id number]  Subjective:  Patient returns for follow-up was generalized anxiety disorder and depression. Patient reports that he seems to be doing okay overall but is struggling to take care of his wife who is sick. He reports that she is a large woman and his finding it difficult to watch her on not do well. States that on a scale of 1-10 with 10 being his best mood and one is lowest he is at a 5. Sleeping well and eating well. No other stressors. States that the last time he was here he did end up in the emergency room and he did have high blood pressure which has been addressed since then. Denies any suicidal thoughts.  Chief Complaint: okay Chief Complaint    Follow-up; Medication Refill     Visit Diagnosis:     ICD-9-CM ICD-10-CM   1. GAD (generalized anxiety disorder) 300.02 F41.1   2. Major depressive disorder, recurrent episode, moderate (HCC) 296.32 F33.1     Past Medical History:  Past Medical History:  Diagnosis Date  . Adenomatous colon polyp   . Allergy   . Angina at rest [facility])    chronic  . Anxiety   . Arthritis   . Bell's palsy 2007  . Carotid artery occlusion   . Cataract    Dr. Dawna Part  . Coronary artery disease   . Depression   . Diabetes mellitus   . Diverticulosis   . Duodenitis   . DVT (deep venous thrombosis) (HCC)    in leg  . Gastropathy   . Heart murmur   . Helicobacter pylori gastritis   . Hyperlipidemia   . Hypertension   . Mild aortic stenosis   . Neuropathy ([place])   . Obesity   . Peripheral vascular disease ([place])   . Post splenectomy syndrome   . Psoriasis   . Psoriatic arthritis ([place])   . Sleep apnea   . SOB (shortness of breath)   . Stroke ([place])   . Tobacco use disorder    recently quit  . Tubular adenoma 08/2013   Dr. Hilarie Fredrickson  . Weak urinary stream     Past Surgical History:   Procedure Laterality Date  . CARDIAC CATHETERIZATION  08/2010   LAD: 80% ISR, RCA: 80% ostial, OM 80-90%  . CAROTID ENDARTERECTOMY  08/2010   left/ Dr. Kellie Simmering  . COLONOSCOPY    . CORONARY ANGIOPLASTY     LAD: before CABG  . CORONARY ARTERY BYPASS GRAFT  09/06/2010   At [facility]: LIMA to LAD, left radial to RCA, sequential SVG to OM3 and 4  . heart stents  Jan 2011   leg stents 06/2009 and 03/2010  . PTA of illiac and SFA  multiple   Dr. Ronalee Belts, s/p revision 11/2012  . SPLENECTOMY     Family History:  Family History  Problem Relation Age of Onset  . Lung cancer Father 75  . Arthritis Father   . Breast cancer Sister 76  . Alcoholism Sister   . Dementia Mother   . Alzheimer's disease Mother   . Arthritis Mother   . Alcoholism Brother   . Epilepsy Sister   . Diabetes Paternal Grandfather   . Colon polyps Paternal Grandfather 56  . Alcoholism Sister   . Alcoholism Sister   . Alcoholism Brother    Social  History:  Social History   Social History  . Marital status: Married    Spouse name: N/A  . Number of children: 0  . Years of education: N/A   Occupational History  . 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 Smoking status: Light Tobacco Smoker    Packs/day: 0.75    Years: 40.00    Types: Cigarettes  . Smokeless tobacco: Never Used     Comment: smokes 2-3 cigarettes every 3 weeks  . Alcohol use No  . Drug use: No  . Sexual activity: No   Other Topics Concern  . None   Social History Narrative  . None   Additional History:   Assessment:   Musculoskeletal: Strength & Muscle Tone: within normal limits Gait & Station: unsteady walks with cane Patient leans: N/A  Psychiatric Specialty Exam: Medication Refill   Anxiety  Patient reports no insomnia, nervous/anxious behavior or suicidal ideas.    Depression         Associated symptoms include does not have insomnia and no suicidal ideas.  Past  medical history includes anxiety.     Review of Systems  Psychiatric/Behavioral: Negative.  Negative for depression, hallucinations, memory loss, substance abuse and suicidal ideas. The patient is not nervous/anxious and does not have insomnia.   All other systems reviewed and are negative.   Blood pressure (!) 166/80, pulse 79, temperature 97.4 F (36.3 C), temperature source Oral, weight 253 lb 3.2 oz (114.9 kg).Body mass index is 39.66 kg/m.  General Appearance: Well Groomed  Eye Contact:  Good  Speech:  Normal Rate  Volume:  Normal  Mood:  depressed  Affect:  Congruent  Thought Process:  Normal  Orientation:  Full (Time, Place, and Person)  Thought Content:  Negative  Suicidal Thoughts:  No  Homicidal Thoughts:  No  Memory:  Immediate;   Good Recent;   Good Remote;   Good  Judgement:  Good  Insight:  Good  Psychomotor Activity:  Negative  Concentration:  Good  Recall:  Good  Fund of Knowledge: Good  Language: Good  Akathisia:  Negative  Handed:  Right  AIMS (if indicated):  N/A  Assets:  Communication Skills Desire for Improvement Social Support  ADL's:  Intact  Cognition: WNL  Sleep:  Good    Is the patient at risk to self?  No. Has the patient been a risk to self in the past 6 months?  No. Has the patient been a risk to self within the distant past?  No. Is the patient a risk to others?  No. Has the patient been a risk to others in the past 6 months?  No. Has the patient been a risk to others within the distant past?  No.  Current Medications: Current Outpatient Prescriptions  Medication Sig Dispense Refill  . ACCU-CHEK FASTCLIX LANCETS MISC Use to check sugar 3 times daily. 102 each 3  . albuterol (PROAIR HFA) 108 (90 Base) MCG/ACT inhaler Inhale 2 puffs into the lungs every 6 (six) hours as needed for shortness of breath.     [name] aspirin 81 MG tablet Take 81 mg by mouth daily.    [name] buPROPion (WELLBUTRIN XL) 150 MG 24 hr tablet Take 1 tablet (150 mg total) by  mouth daily. 30 tablet 4  . busPIRone (BUSPAR) 10 MG tablet Take 2 tablets (20 mg total) by mouth 2 (two) times daily. 120 tablet 4  .  cholecalciferol (VITAMIN D) 1000 UNITS tablet Take 1,000 Units by mouth daily.      . Cinnamon 500 MG capsule Take 1,000 mg by mouth daily.    . clopidogrel (PLAVIX) 75 MG tablet Take 1 tablet (75 mg total) by mouth daily. 90 tablet 3  . cyclobenzaprine (FLEXERIL) 10 MG tablet take 1 tablet by mouth three times a day if needed for muscle spasm 30 tablet 0  . Glucosamine-Chondroit-Vit C-Mn (GLUCOSAMINE CHONDR 500 COMPLEX) CAPS Take 1 capsule by mouth daily.     . insulin NPH Human (NOVOLIN N RELION) 100 UNIT/ML injection Inject 40 units in am and 20 units in the evening (Patient taking differently: Inject 0-45 Units into the skin 2 (two) times daily as needed. ) 40 mL 2  . insulin regular (NOVOLIN R,HUMULIN R) 100 units/mL injection Inject 0.1-0.3 mLs (10-30 Units total) into the skin 3 (three) times daily before meals. (Patient taking differently: Inject 0-45 Units into the skin 2 (two) times daily as needed for high blood sugar. ) 40 mL 5  . ketoconazole (NIZORAL) 2 % cream apply to affected area AS NEEDED FOR IRRITATION  0  . losartan (COZAAR) 25 MG tablet take 1 tablet by mouth once daily 90 tablet 3  . magnesium gluconate (MAGONATE) 500 MG tablet Take 1,000 mg by mouth daily.    . Multiple Vitamin (MULTIVITAMIN) capsule Take 1 capsule by mouth daily.      . nitroGLYCERIN (NITROSTAT) 0.4 MG SL tablet Place 1 tablet (0.4 mg total) under the tongue every 5 (five) minutes as needed. 25 tablet 1  . omeprazole (PRILOSEC) 20 MG capsule Take 1 capsule (20 mg total) by mouth 2 (two) times daily. (Patient taking differently: Take 20 mg by mouth 2 (two) times daily as needed (HEARTBURN). ) 20 capsule 0  . sertraline (ZOLOFT) 100 MG tablet Take 1.5 tablets (150 mg total) by mouth daily. 45 tablet 4  . simvastatin (ZOCOR) 20 MG tablet take 1 tablet by mouth once daily AT 6PM  90 tablet 3  . tamsulosin (FLOMAX) 0.4 MG CAPS capsule take 2 capsule by mouth daily 60 capsule 11  . topiramate (TOPAMAX) 50 MG tablet 1 tablet in the morning, 2 tablets at night 90 tablet 5  . triamcinolone ointment (KENALOG) 0.1 % apply to affected area twice a day AVOID FACE, GROIN AND UNDERARMS  0  . carvedilol (COREG) 12.5 MG tablet Take 1 tablet (12.5 mg total) by mouth 2 (two) times daily. 60 tablet 11   No current facility-administered medications for this visit.     Medical Decision Making:  Established Problem, Stable/Improving (1) and Review of New Medication or Change in Dosage (2)  Treatment Plan Summary:Medication management and Plan   Depressive disorder, moderate, recurrent-  Continue the sertraline at 150 mg daily  Increase  Wellbutrin XL 300 mg in the morning. Continue to see therapist, Dr. Cristy Folks.  Generalized anxiety disorder-stable We'll continue his BuSpar to 20 mg twice daily.    High BP- Reports todays reading is the highest. Reports his BP has been ok overall.  Patient will follow up in 2  months.   Io Dieujuste 09/25/2016, 8:45 AM

## 2016-09-26 ENCOUNTER — Ambulatory Visit (INDEPENDENT_AMBULATORY_CARE_PROVIDER_SITE_OTHER): Payer: Medicare Other | Admitting: Psychology

## 2016-09-26 DIAGNOSIS — F4312 Post-traumatic stress disorder, chronic: Secondary | ICD-10-CM

## 2016-10-01 ENCOUNTER — Ambulatory Visit: Payer: Medicare Other | Admitting: Psychiatry

## 2016-10-03 ENCOUNTER — Ambulatory Visit: Payer: Medicare Other | Admitting: Psychology

## 2016-10-04 DIAGNOSIS — M0579 Rheumatoid arthritis with rheumatoid factor of multiple sites without organ or systems involvement: Secondary | ICD-10-CM | POA: Diagnosis not present

## 2016-10-10 ENCOUNTER — Ambulatory Visit: Payer: Medicare Other | Admitting: Psychology

## 2016-10-12 ENCOUNTER — Encounter: Payer: Self-pay | Admitting: Cardiovascular Disease

## 2016-10-12 ENCOUNTER — Ambulatory Visit (INDEPENDENT_AMBULATORY_CARE_PROVIDER_SITE_OTHER): Payer: Medicare Other | Admitting: Cardiovascular Disease

## 2016-10-12 VITALS — BP 136/84 | HR 71 | Ht 67.0 in | Wt 252.0 lb

## 2016-10-12 DIAGNOSIS — I350 Nonrheumatic aortic (valve) stenosis: Secondary | ICD-10-CM

## 2016-10-12 DIAGNOSIS — I6521 Occlusion and stenosis of right carotid artery: Secondary | ICD-10-CM

## 2016-10-12 DIAGNOSIS — I10 Essential (primary) hypertension: Secondary | ICD-10-CM

## 2016-10-12 DIAGNOSIS — I739 Peripheral vascular disease, unspecified: Secondary | ICD-10-CM

## 2016-10-12 DIAGNOSIS — I779 Disorder of arteries and arterioles, unspecified: Secondary | ICD-10-CM

## 2016-10-12 DIAGNOSIS — E782 Mixed hyperlipidemia: Secondary | ICD-10-CM | POA: Diagnosis not present

## 2016-10-12 DIAGNOSIS — I2510 Atherosclerotic heart disease of native coronary artery without angina pectoris: Secondary | ICD-10-CM

## 2016-10-12 NOTE — Patient Instructions (Signed)
Medication Instructions:  Your physician recommends that you continue on your current medications as directed. Please refer to the Current Medication list given to you today.   Labwork: none  Testing/Procedures: Your physician has requested that you have a carotid duplex in Sept. This test is an ultrasound of the carotid arteries in your neck. It looks at blood flow through these arteries that supply the brain with blood. Allow one hour for this exam. There are no restrictions or special instructions.    Follow-Up: Your physician wants you to follow-up in: Sept with Dr. Fletcher Anon.  You will receive a reminder letter in the mail two months in advance. If you don't receive a letter, please call our office to schedule the follow-up appointment.   Any Other Special Instructions Will Be Listed Below (If Applicable).     If you need a refill on your cardiac medications before your next appointment, please call your pharmacy.

## 2016-10-12 NOTE — Progress Notes (Signed)
Cardiology Office Note   Date:  10/12/2016   ID:  [name] [name], DOB [birth year], MRN [id number]  PCP:  [name] Simpler, MD  Cardiologist:   [name] Sacramento, MD   Chief Complaint  Patient presents with  . other    3 month follow up. Meds reviewed by the pt. verbally. "doing well."       History of Present Illness: [name] [name] is a 61 y.o. male who presents for a follow-up visit regarding coronary artery disease. He is status post CABG in 2012 for 3 vessel coronary artery disease. He has extensive medical problems that include carotid artery disease status post left carotid endarterectomy , aortic stenosis, tobacco use, obesity, psoriasis, type 2 diabetes, hypertension, hyperlipidemia and peripheral arterial disease which is being followed by Dr. Ronalee [name]. Most recent stress test was done in of 2013 which showed no evidence of ischemia. Echocardiogram in October 2017 showed low normal LV systolic function with an EF of 84-13%, grade 2 diastolic dysfunction, moderate aortic stenosis with mean gradient of 21 mmHg and mild to moderate mitral regurgitation.  He has been doing well overall with stabilization of elevated blood pressure. He denies any chest pain or worsening dyspnea. He has been taking his medications regularly. His blood pressure has been reasonably controlled.  Past Medical History:  Diagnosis Date  . Adenomatous colon polyp   . Allergy   . Angina at rest [facility])    chronic  . Anxiety   . Arthritis   . Bell's palsy 2007  . Carotid artery occlusion   . Cataract    Dr. Dawna [name]  . Coronary artery disease   . Depression   . Diabetes mellitus   . Diverticulosis   . Duodenitis   . DVT (deep venous thrombosis) (HCC)    in leg  . Gastropathy   . Heart murmur   . Helicobacter pylori gastritis   . Hyperlipidemia   . Hypertension   . Mild aortic stenosis   . Neuropathy ([place])   . Obesity   . Peripheral vascular disease ([place])   . Post splenectomy syndrome   .  Psoriasis   . Psoriatic arthritis ([place])   . Sleep apnea   . SOB (shortness of breath)   . Stroke ([place])   . Tobacco use disorder    recently quit  . Tubular adenoma 08/2013   Dr. Hilarie [name]  . Weak urinary stream     Past Surgical History:  Procedure Laterality Date  . CARDIAC CATHETERIZATION  08/2010   LAD: 80% ISR, RCA: 80% ostial, OM 80-90%  . CAROTID ENDARTERECTOMY  08/2010   left/ Dr. Kellie [name]  . COLONOSCOPY    . CORONARY ANGIOPLASTY     LAD: before CABG  . CORONARY ARTERY BYPASS GRAFT  09/06/2010   At [facility]: LIMA to LAD, left radial to RCA, sequential SVG to OM3 and 4  . heart stents  Jan 2011   leg stents 06/2009 and 03/2010  . PTA of illiac and SFA  multiple   Dr. Ronalee [name], s/p revision 11/2012  . SPLENECTOMY       Current Outpatient Prescriptions  Medication Sig Dispense Refill  . ACCU-CHEK FASTCLIX LANCETS MISC Use to check sugar 3 times daily. 102 each 3  . albuterol (PROAIR HFA) 108 (90 Base) MCG/ACT inhaler Inhale 2 puffs into the lungs every 6 (six) hours as needed for shortness of breath.     [name] aspirin 81 MG tablet Take 81 mg by mouth daily.    [name]  buPROPion (WELLBUTRIN XL) 300 MG 24 hr tablet Take 1 tablet (300 mg total) by mouth daily. 30 tablet 2  . busPIRone (BUSPAR) 10 MG tablet Take 2 tablets (20 mg total) by mouth 2 (two) times daily. 120 tablet 4  . carvedilol (COREG) 12.5 MG tablet Take 1 tablet (12.5 mg total) by mouth 2 (two) times daily. 60 tablet 11  . clopidogrel (PLAVIX) 75 MG tablet Take 1 tablet (75 mg total) by mouth daily. 90 tablet 3  . cyclobenzaprine (FLEXERIL) 10 MG tablet take 1 tablet by mouth three times a day if needed for muscle spasm 30 tablet 0  . insulin NPH Human (NOVOLIN N RELION) 100 UNIT/ML injection Inject 40 units in am and 20 units in the evening (Patient taking differently: Inject 0-45 Units into the skin 2 (two) times daily as needed. ) 40 mL 2  . insulin regular (NOVOLIN R,HUMULIN R) 100 units/mL injection Inject 0.1-0.3 mLs (10-30  Units total) into the skin 3 (three) times daily before meals. (Patient taking differently: Inject 0-45 Units into the skin 2 (two) times daily as needed for high blood sugar. ) 40 mL 5  . ketoconazole (NIZORAL) 2 % cream apply to affected area AS NEEDED FOR IRRITATION  0  . losartan (COZAAR) 25 MG tablet take 1 tablet by mouth once daily 90 tablet 3  . Multiple Vitamin (MULTIVITAMIN) capsule Take 1 capsule by mouth daily.      . nitroGLYCERIN (NITROSTAT) 0.4 MG SL tablet Place 1 tablet (0.4 mg total) under the tongue every 5 (five) minutes as needed. 25 tablet 1  . omeprazole (PRILOSEC) 20 MG capsule Take 1 capsule (20 mg total) by mouth 2 (two) times daily. (Patient taking differently: Take 20 mg by mouth 2 (two) times daily as needed (HEARTBURN). ) 20 capsule 0  . sertraline (ZOLOFT) 100 MG tablet Take 1.5 tablets (150 mg total) by mouth daily. 45 tablet 4  . simvastatin (ZOCOR) 20 MG tablet take 1 tablet by mouth once daily AT 6PM 90 tablet 3  . tamsulosin (FLOMAX) 0.4 MG CAPS capsule take 2 capsule by mouth daily 60 capsule 11  . topiramate (TOPAMAX) 50 MG tablet 1 tablet in the morning, 2 tablets at night 90 tablet 5  . triamcinolone ointment (KENALOG) 0.1 % apply to affected area twice a day AVOID FACE, GROIN AND UNDERARMS  0   No current facility-administered medications for this visit.     Allergies:   Contrast media [iodinated diagnostic agents]; Glipizide; Metrizamide; and Penicillins    Social History:  The patient  reports that he has been smoking Cigarettes.  He has a 30.00 pack-year smoking history. He has never used smokeless tobacco. He reports that he does not drink alcohol or use drugs.   Family History:  The patient's family history includes Alcoholism in his brother, brother, sister, sister, and sister; Alzheimer's disease in his mother; Arthritis in his father and mother; Breast cancer (age of onset: 94) in his sister; Colon polyps (age of onset: 8) in his paternal  grandfather; Dementia in his mother; Diabetes in his paternal grandfather; Epilepsy in his sister; Lung cancer (age of onset: 40) in his father.    ROS:  Please see the history of present illness.   Otherwise, review of systems are positive for none.   All other systems are reviewed and negative.    PHYSICAL EXAM: VS:  BP 136/84 (BP Location: Left Arm, Patient Position: Sitting, Cuff Size: Normal)   Pulse 71  Ht 5\' 7"  (1.702 m)   Wt 252 lb (114.3 kg)   BMI 39.47 kg/m  , BMI Body mass index is 39.47 kg/m. GEN: Well nourished, well developed, in no acute distress  HEENT: normal  Neck: no JVD, or masses. Bilateral carotid bruits Cardiac: RRR; no rubs, or gallops,no edema . 3/6 crescendo decrescendo systolic murmur in the aortic area which is mid peaking with diminished S2 Respiratory:  clear to auscultation bilaterally, normal work of breathing GI: soft, nontender, nondistended, + BS MS: no deformity or atrophy  Skin: warm and dry, no rash Neuro:  Strength and sensation are intact Psych: euthymic mood, full affect   EKG:  EKG is ordered today. EKG showed normal sinus rhythm with left anterior fascicular block.    Recent Labs: 07/02/2016: ALT 13; BUN 32; Creatinine, Ser 1.60; Hemoglobin 14.3; Platelets 150; Potassium 5.4; Sodium 139    Lipid Panel    Component Value Date/Time   CHOL 118 08/16/2016 0943   TRIG 227.0 (H) 08/16/2016 0943   HDL 29.60 (L) 08/16/2016 0943   CHOLHDL 4 08/16/2016 0943   VLDL 45.4 (H) 08/16/2016 0943   LDLCALC 55 12/13/2015 0846   LDLDIRECT 54.0 08/16/2016 0943      Wt Readings from Last 3 Encounters:  10/12/16 252 lb (114.3 kg)  09/12/16 251 lb (113.9 kg)  08/16/16 257 lb (116.6 kg)       No flowsheet data found.    ASSESSMENT AND PLAN:  1.  Coronary artery disease involving native coronary arteries without angina: He is overall stable from a cardiac standpoint with no worsening symptoms. Continue medical therapy.He is on long-term  dual antiplatelet therapy due to extensive generalized atherosclerosis.  2. Moderate aortic stenosis:  Repeat echocardiogram in October 2018.  3. Bilateral carotid disease status post left carotid endarterectomy.  I requested a follow-up carotid Doppler to be done in September of this year.  4. Essential hypertension:   Blood pressure is reasonably controlled on current medications.  5. Hyperlipidemia: Most recent LDL was 54 on current dose on simvastatin. He does have mildly elevated triglyceride and low HDL. I discussed with him the importance of diet and exercise.    Disposition:   FU with me in 6 months  Signed,  [name] Sacramento, MD  10/12/2016 9:47 AM    [place]

## 2016-10-17 ENCOUNTER — Ambulatory Visit (INDEPENDENT_AMBULATORY_CARE_PROVIDER_SITE_OTHER): Payer: Medicare Other | Admitting: Psychology

## 2016-10-17 DIAGNOSIS — F4312 Post-traumatic stress disorder, chronic: Secondary | ICD-10-CM

## 2016-10-17 NOTE — Addendum Note (Signed)
Addended by: Anselm Pancoast on: 10/17/2016 10:19 AM   Modules accepted: Orders

## 2016-10-19 ENCOUNTER — Other Ambulatory Visit: Payer: Self-pay | Admitting: Internal Medicine

## 2016-10-22 ENCOUNTER — Other Ambulatory Visit: Payer: Self-pay

## 2016-10-22 ENCOUNTER — Telehealth: Payer: Self-pay | Admitting: Internal Medicine

## 2016-10-22 MED ORDER — INSULIN REGULAR HUMAN 100 UNIT/ML IJ SOLN: 10.0000 [IU] | Freq: Three times a day (TID) | INTRAMUSCULAR | 5 refills | Status: DC

## 2016-10-22 MED ORDER — INSULIN NPH (HUMAN) (ISOPHANE) 100 UNIT/ML ~~LOC~~ SUSP: SUBCUTANEOUS | 2 refills | Status: DC

## 2016-10-22 NOTE — Telephone Encounter (Signed)
Pt needs his Novolin R and N sent into the [facility] in [place].

## 2016-10-22 NOTE — Telephone Encounter (Signed)
Submitted Rx's

## 2016-10-24 ENCOUNTER — Ambulatory Visit: Payer: Medicare Other | Admitting: Psychology

## 2016-10-30 ENCOUNTER — Encounter: Payer: Self-pay | Admitting: Psychiatry

## 2016-10-30 ENCOUNTER — Ambulatory Visit (INDEPENDENT_AMBULATORY_CARE_PROVIDER_SITE_OTHER): Payer: Medicare Other | Admitting: Psychiatry

## 2016-10-30 VITALS — BP 159/80 | HR 71 | Temp 98.3°F | Wt 251.0 lb

## 2016-10-30 DIAGNOSIS — F411 Generalized anxiety disorder: Secondary | ICD-10-CM | POA: Diagnosis not present

## 2016-10-30 DIAGNOSIS — F331 Major depressive disorder, recurrent, moderate: Secondary | ICD-10-CM

## 2016-10-30 DIAGNOSIS — I6521 Occlusion and stenosis of right carotid artery: Secondary | ICD-10-CM

## 2016-10-30 NOTE — Progress Notes (Signed)
Patient ID: [name] [name], male   DOB: 1955-08-27, 61 y.o.   MRN: [id number] [facility] MD/PA/NP OP Progress Note  10/30/2016 10:27 AM [name] [name]  MRN:  [id number]  Subjective:  Patient returns for follow-up was generalized anxiety disorder and depression. Patient reports that he is tolerating the increase in wellbutrin well, his mood is much better. States that his wife sometimes snacks him and that irritates him but otherwise he is doing well. Looking forward to a cruise in 2020 that they got a good deal on. Overall things are going well for him. Denies any side effects. Fair sleep and appetite.  Chief Complaint: doing better Chief Complaint    Follow-up; Medication Refill     Visit Diagnosis:     ICD-9-CM ICD-10-CM   1. GAD (generalized anxiety disorder) 300.02 F41.1   2. Major depressive disorder, recurrent episode, moderate (HCC) 296.32 F33.1     Past Medical History:  Past Medical History:  Diagnosis Date  . Adenomatous colon polyp   . Allergy   . Angina at rest [facility])    chronic  . Anxiety   . Arthritis   . Bell's palsy 2007  . Carotid artery occlusion   . Cataract    Dr. Dawna Part  . Coronary artery disease   . Depression   . Diabetes mellitus   . Diverticulosis   . Duodenitis   . DVT (deep venous thrombosis) (HCC)    in leg  . Gastropathy   . Heart murmur   . Helicobacter pylori gastritis   . Hyperlipidemia   . Hypertension   . Mild aortic stenosis   . Neuropathy ([place])   . Obesity   . Peripheral vascular disease ([place])   . Post splenectomy syndrome   . Psoriasis   . Psoriatic arthritis ([place])   . Sleep apnea   . SOB (shortness of breath)   . Stroke ([place])   . Tobacco use disorder    recently quit  . Tubular adenoma 08/2013   Dr. Hilarie Fredrickson  . Weak urinary stream     Past Surgical History:  Procedure Laterality Date  . CARDIAC CATHETERIZATION  08/2010   LAD: 80% ISR, RCA: 80% ostial, OM 80-90%  . CAROTID ENDARTERECTOMY  08/2010   left/ Dr. Kellie Simmering  .  COLONOSCOPY    . CORONARY ANGIOPLASTY     LAD: before CABG  . CORONARY ARTERY BYPASS GRAFT  09/06/2010   At [facility]: LIMA to LAD, left radial to RCA, sequential SVG to OM3 and 4  . heart stents  Jan 2011   leg stents 06/2009 and 03/2010  . PTA of illiac and SFA  multiple   Dr. Ronalee Belts, s/p revision 11/2012  . SPLENECTOMY     Family History:  Family History  Problem Relation Age of Onset  . Lung cancer Father 91  . Arthritis Father   . Breast cancer Sister 45  . Alcoholism Sister   . Dementia Mother   . Alzheimer's disease Mother   . Arthritis Mother   . Alcoholism Brother   . Epilepsy Sister   . Diabetes Paternal Grandfather   . Colon polyps Paternal Grandfather 56  . Alcoholism Sister   . Alcoholism Sister   . Alcoholism Brother    Social History:  Social History   Social History  . Marital status: Married    Spouse name: N/A  . Number of children: 0  . Years of education: N/A   Occupational History  . 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 Smoking status: Light Tobacco Smoker    Packs/day: 0.75    Years: 40.00    Types: Cigarettes  . Smokeless tobacco: Never Used     Comment: smokes 2-3 cigarettes every 3 weeks  . Alcohol use No  . Drug use: No  . Sexual activity: No   Other Topics Concern  . None   Social History Narrative  . None   Additional History:   Assessment:   Musculoskeletal: Strength & Muscle Tone: within normal limits Gait & Station: unsteady walks with cane Patient leans: N/A  Psychiatric Specialty Exam: Medication Refill   Anxiety  Patient reports no insomnia, nervous/anxious behavior or suicidal ideas.    Depression         Associated symptoms include does not have insomnia and no suicidal ideas.  Past medical history includes anxiety.     Review of Systems  Psychiatric/Behavioral: Negative.  Negative for depression, hallucinations, memory loss, substance abuse and  suicidal ideas. The patient is not nervous/anxious and does not have insomnia.   All other systems reviewed and are negative.   Blood pressure (!) 159/80, pulse 71, temperature 98.3 F (36.8 C), temperature source Oral, weight 251 lb (113.9 kg).Body mass index is 39.31 kg/m.  General Appearance: Well Groomed  Eye Contact:  Good  Speech:  Normal Rate  Volume:  Normal  Mood:  improved  Affect:  Congruent  Thought Process:  Normal  Orientation:  Full (Time, Place, and Person)  Thought Content:  Negative  Suicidal Thoughts:  No  Homicidal Thoughts:  No  Memory:  Immediate;   Good Recent;   Good Remote;   Good  Judgement:  Good  Insight:  Good  Psychomotor Activity:  Negative  Concentration:  Good  Recall:  Good  Fund of Knowledge: Good  Language: Good  Akathisia:  Negative  Handed:  Right  AIMS (if indicated):  N/A  Assets:  Communication Skills Desire for Improvement Social Support  ADL's:  Intact  Cognition: WNL  Sleep:  Good    Is the patient at risk to self?  No. Has the patient been a risk to self in the past 6 months?  No. Has the patient been a risk to self within the distant past?  No. Is the patient a risk to others?  No. Has the patient been a risk to others in the past 6 months?  No. Has the patient been a risk to others within the distant past?  No.  Current Medications: Current Outpatient Prescriptions  Medication Sig Dispense Refill  . ACCU-CHEK FASTCLIX LANCETS MISC Use to check sugar 3 times daily. 102 each 3  . albuterol (PROAIR HFA) 108 (90 Base) MCG/ACT inhaler Inhale 2 puffs into the lungs every 6 (six) hours as needed for shortness of breath.     [name] aspirin 81 MG tablet Take 81 mg by mouth daily.    [name] buPROPion (WELLBUTRIN XL) 300 MG 24 hr tablet Take 1 tablet (300 mg total) by mouth daily. 30 tablet 2  . busPIRone (BUSPAR) 10 MG tablet Take 2 tablets (20 mg total) by mouth 2 (two) times daily. 120 tablet 4  . clopidogrel (PLAVIX) 75 MG tablet Take  1 tablet (75 mg total) by mouth daily. 90 tablet 3  . cyclobenzaprine (FLEXERIL) 10 MG tablet take 1 tablet by mouth three times a day if needed for muscle spasm 30 tablet 0  . insulin NPH Human (  NOVOLIN N RELION) 100 UNIT/ML injection Inject 40 units in am and 20 units in the evening 40 mL 2  . insulin regular (NOVOLIN R,HUMULIN R) 250 units/2.39mL (100 units/mL) injection Inject 0.1-0.3 mLs (10-30 Units total) into the skin 3 (three) times daily before meals. 40 mL 5  . ketoconazole (NIZORAL) 2 % cream apply to affected area AS NEEDED FOR IRRITATION  0  . losartan (COZAAR) 25 MG tablet take 1 tablet by mouth once daily 90 tablet 3  . Multiple Vitamin (MULTIVITAMIN) capsule Take 1 capsule by mouth daily.      . nitroGLYCERIN (NITROSTAT) 0.4 MG SL tablet Place 1 tablet (0.4 mg total) under the tongue every 5 (five) minutes as needed. 25 tablet 1  . NOVOLIN N RELION 100 UNIT/ML injection INJECT 40 UNITS IN THE MORNING AND 20 UNITS IN THE EVENING 40 mL 2  . omeprazole (PRILOSEC) 20 MG capsule Take 1 capsule (20 mg total) by mouth 2 (two) times daily. (Patient taking differently: Take 20 mg by mouth 2 (two) times daily as needed (HEARTBURN). ) 20 capsule 0  . sertraline (ZOLOFT) 100 MG tablet Take 1.5 tablets (150 mg total) by mouth daily. 45 tablet 4  . simvastatin (ZOCOR) 20 MG tablet take 1 tablet by mouth once daily AT 6PM 90 tablet 3  . tamsulosin (FLOMAX) 0.4 MG CAPS capsule take 2 capsule by mouth daily 60 capsule 11  . topiramate (TOPAMAX) 50 MG tablet 1 tablet in the morning, 2 tablets at night 90 tablet 5  . triamcinolone ointment (KENALOG) 0.1 % apply to affected area twice a day AVOID FACE, GROIN AND UNDERARMS  0  . carvedilol (COREG) 12.5 MG tablet Take 1 tablet (12.5 mg total) by mouth 2 (two) times daily. 60 tablet 11   No current facility-administered medications for this visit.     Medical Decision Making:  Established Problem, Stable/Improving (1) and Review of New Medication or  Change in Dosage (2)  Treatment Plan Summary:Medication management and Plan   Depressive disorder, moderate, recurrent-  Continue the sertraline at 150 mg daily  Continue  Wellbutrin XL 300 mg in the morning. Continue to see therapist, Dr. Cristy Folks.  Generalized anxiety disorder-stable We'll continue his BuSpar to 20 mg twice daily.    Patient urged to watch his blood pressure closely.  Patient will follow up in 2  months. Call before if needed   Adreyan Carbajal 10/30/2016, 10:27 AM

## 2016-11-07 ENCOUNTER — Ambulatory Visit: Payer: Medicare Other | Admitting: Psychology

## 2016-11-14 ENCOUNTER — Ambulatory Visit: Payer: Medicare Other | Admitting: Psychology

## 2016-11-14 ENCOUNTER — Ambulatory Visit: Payer: Self-pay | Admitting: Internal Medicine

## 2016-11-16 DIAGNOSIS — M0579 Rheumatoid arthritis with rheumatoid factor of multiple sites without organ or systems involvement: Secondary | ICD-10-CM | POA: Diagnosis not present

## 2016-11-16 DIAGNOSIS — Z79899 Other long term (current) drug therapy: Secondary | ICD-10-CM | POA: Diagnosis not present

## 2016-11-19 DIAGNOSIS — L851 Acquired keratosis [keratoderma] palmaris et plantaris: Secondary | ICD-10-CM | POA: Diagnosis not present

## 2016-11-19 DIAGNOSIS — E1142 Type 2 diabetes mellitus with diabetic polyneuropathy: Secondary | ICD-10-CM | POA: Diagnosis not present

## 2016-11-19 DIAGNOSIS — B351 Tinea unguium: Secondary | ICD-10-CM | POA: Diagnosis not present

## 2016-11-21 ENCOUNTER — Ambulatory Visit (INDEPENDENT_AMBULATORY_CARE_PROVIDER_SITE_OTHER): Payer: Medicare Other | Admitting: Psychology

## 2016-11-21 DIAGNOSIS — F4312 Post-traumatic stress disorder, chronic: Secondary | ICD-10-CM

## 2016-11-26 DIAGNOSIS — Z6841 Body Mass Index (BMI) 40.0 and over, adult: Secondary | ICD-10-CM | POA: Diagnosis not present

## 2016-11-26 DIAGNOSIS — M2550 Pain in unspecified joint: Secondary | ICD-10-CM | POA: Diagnosis not present

## 2016-11-26 DIAGNOSIS — M0579 Rheumatoid arthritis with rheumatoid factor of multiple sites without organ or systems involvement: Secondary | ICD-10-CM | POA: Diagnosis not present

## 2016-11-26 DIAGNOSIS — L4050 Arthropathic psoriasis, unspecified: Secondary | ICD-10-CM | POA: Diagnosis not present

## 2016-11-26 DIAGNOSIS — L409 Psoriasis, unspecified: Secondary | ICD-10-CM | POA: Diagnosis not present

## 2016-11-26 DIAGNOSIS — M150 Primary generalized (osteo)arthritis: Secondary | ICD-10-CM | POA: Diagnosis not present

## 2016-11-26 DIAGNOSIS — M5481 Occipital neuralgia: Secondary | ICD-10-CM | POA: Diagnosis not present

## 2016-11-26 DIAGNOSIS — Z79899 Other long term (current) drug therapy: Secondary | ICD-10-CM | POA: Diagnosis not present

## 2016-11-27 DIAGNOSIS — N183 Chronic kidney disease, stage 3 (moderate): Secondary | ICD-10-CM | POA: Diagnosis not present

## 2016-11-27 DIAGNOSIS — E875 Hyperkalemia: Secondary | ICD-10-CM | POA: Diagnosis not present

## 2016-11-27 DIAGNOSIS — E1122 Type 2 diabetes mellitus with diabetic chronic kidney disease: Secondary | ICD-10-CM | POA: Diagnosis not present

## 2016-11-27 DIAGNOSIS — R809 Proteinuria, unspecified: Secondary | ICD-10-CM | POA: Diagnosis not present

## 2016-11-27 DIAGNOSIS — N2581 Secondary hyperparathyroidism of renal origin: Secondary | ICD-10-CM | POA: Diagnosis not present

## 2016-11-27 DIAGNOSIS — I129 Hypertensive chronic kidney disease with stage 1 through stage 4 chronic kidney disease, or unspecified chronic kidney disease: Secondary | ICD-10-CM | POA: Diagnosis not present

## 2016-11-28 ENCOUNTER — Ambulatory Visit (INDEPENDENT_AMBULATORY_CARE_PROVIDER_SITE_OTHER): Payer: Medicare Other | Admitting: Psychology

## 2016-11-28 DIAGNOSIS — F4312 Post-traumatic stress disorder, chronic: Secondary | ICD-10-CM

## 2016-12-05 ENCOUNTER — Ambulatory Visit (INDEPENDENT_AMBULATORY_CARE_PROVIDER_SITE_OTHER): Payer: Medicare Other | Admitting: Psychology

## 2016-12-05 DIAGNOSIS — F4312 Post-traumatic stress disorder, chronic: Secondary | ICD-10-CM

## 2016-12-06 ENCOUNTER — Ambulatory Visit
Admission: EM | Admit: 2016-12-06 | Discharge: 2016-12-06 | Disposition: A | Discharge status: Home / Self Care | Payer: Medicare Other | Attending: Family Medicine | Admitting: Family Medicine

## 2016-12-06 DIAGNOSIS — Z7982 Long term (current) use of aspirin: Secondary | ICD-10-CM | POA: Diagnosis not present

## 2016-12-06 DIAGNOSIS — Z794 Long term (current) use of insulin: Secondary | ICD-10-CM | POA: Insufficient documentation

## 2016-12-06 DIAGNOSIS — L0201 Cutaneous abscess of face: Secondary | ICD-10-CM

## 2016-12-06 DIAGNOSIS — E119 Type 2 diabetes mellitus without complications: Secondary | ICD-10-CM | POA: Diagnosis not present

## 2016-12-06 DIAGNOSIS — Z79899 Other long term (current) drug therapy: Secondary | ICD-10-CM | POA: Diagnosis not present

## 2016-12-06 DIAGNOSIS — Z880 Allergy status to penicillin: Secondary | ICD-10-CM | POA: Diagnosis not present

## 2016-12-06 DIAGNOSIS — Z7902 Long term (current) use of antithrombotics/antiplatelets: Secondary | ICD-10-CM | POA: Insufficient documentation

## 2016-12-06 DIAGNOSIS — F17210 Nicotine dependence, cigarettes, uncomplicated: Secondary | ICD-10-CM | POA: Diagnosis not present

## 2016-12-06 MED ORDER — DOXYCYCLINE HYCLATE 100 MG PO CAPS: 100.0000 mg | ORAL_CAPSULE | Freq: Two times a day (BID) | ORAL | 0 refills | Status: DC

## 2016-12-06 MED ORDER — MUPIROCIN CALCIUM 2 % EX CREA: 1.0000 "application " | TOPICAL_CREAM | Freq: Two times a day (BID) | CUTANEOUS | 0 refills | Status: DC

## 2016-12-06 NOTE — ED Provider Notes (Signed)
CSN: [id number]     Arrival date & time 12/06/16  1019 History   First MD Initiated Contact with Patient 12/06/16 1113     Chief Complaint  Patient presents with  . Cyst    right side of face   (Consider location/radiation/quality/duration/timing/severity/associated sxs/prior Treatment) Patient is a 61 year old male with past history as noted below, including diabetes, who presents with a possible abscess on the right cheek. Patient thought that it was initially a ingrown hair or pimple that is not improved. Patient states that the last 2 days has become firmer and more painful. Patient denies taking any antibiotics or over-the-counter ointments. Patient denies fever and chills. Patient does report a allergy to penicillin. Patient denies any difficulty chewing or swallowing. Patient has no pain or discomfort inside of his mouth around his teeth.      Past Medical History:  Diagnosis Date  . Adenomatous colon polyp   . Allergy   . [facility])    chronic  . Anxiety   . Arthritis   . Bell's palsy 2007  . Carotid artery occlusion   . Cataract    Dr. Dawna Part  . Coronary artery disease   . Depression   . Diabetes mellitus   . Diverticulosis   . Duodenitis   . DVT (deep venous thrombosis) ([facility])    in leg  . Gastropathy   . Heart murmur   . Helicobacter pylori gastritis   . Hyperlipidemia   . Hypertension   . Mild aortic stenosis   . Neuropathy   . Obesity   . Peripheral vascular disease ([place])   . Post splenectomy syndrome   . Psoriasis   . Psoriatic arthritis ([place])   . Sleep apnea   . SOB (shortness of breath)   . Stroke ([place])   . Tobacco use disorder    recently quit  . Tubular adenoma 08/2013   Dr. Hilarie Fredrickson  . Weak urinary stream    Past Surgical History:  Procedure Laterality Date  . CARDIAC CATHETERIZATION  08/2010   LAD: 80% ISR, RCA: 80% ostial, OM 80-90%  . CAROTID ENDARTERECTOMY  08/2010   left/ Dr. Kellie Simmering  . COLONOSCOPY    . CORONARY ANGIOPLASTY       LAD: before CABG  . CORONARY ARTERY BYPASS GRAFT  09/06/2010   At [facility]: LIMA to LAD, left radial to RCA, sequential SVG to OM3 and 4  . heart stents  Jan 2011   leg stents 06/2009 and 03/2010  . PTA of illiac and SFA  multiple   Dr. Ronalee Belts, s/p revision 11/2012  . SPLENECTOMY     Family History  Problem Relation Age of Onset  . Lung cancer Father 26  . Arthritis Father   . Breast cancer Sister 50  . Alcoholism Sister   . Dementia Mother   . Alzheimer's disease Mother   . Arthritis Mother   . Alcoholism Brother   . Epilepsy Sister   . Diabetes Paternal Grandfather   . Colon polyps Paternal Grandfather 47  . Alcoholism Sister   . Alcoholism Sister   . Alcoholism Brother    Social History  Substance Use Topics  . Smoking status: Light Tobacco Smoker    Packs/day: 0.75    Years: 40.00    Types: Cigarettes  . Smokeless tobacco: Never Used     Comment: smokes 2-3 cigarettes every 3 weeks  . Alcohol use No    Review of Systems  Constitutional: Negative for chills and  fever.  HENT: Positive for facial swelling (with pain and redness). Negative for dental problem, sore throat and trouble swallowing.   Eyes: Negative.   Respiratory: Negative.   Skin:       Facial abscess  All other systems reviewed and are negative.   Allergies  Contrast media [iodinated diagnostic agents]; Glipizide; Metrizamide; and Penicillins  Home Medications   Prior to Admission medications   Medication Sig Start Date End Date Taking? Authorizing Provider  ACCU-CHEK FASTCLIX LANCETS MISC Use to check sugar 3 times daily. 08/16/16  Yes Philemon Kingdom, MD  albuterol (PROAIR HFA) 108 (90 Base) MCG/ACT inhaler Inhale 2 puffs into the lungs every 6 (six) hours as needed for shortness of breath.  09/17/14  Yes [provider]  aspirin 81 MG tablet Take 81 mg by mouth daily.   Yes [provider]  buPROPion (WELLBUTRIN XL) 300 MG 24 hr tablet Take 1 tablet (300 mg total) by mouth  daily. 09/25/16  Yes Ravi, Himabindu, MD  busPIRone (BUSPAR) 10 MG tablet Take 2 tablets (20 mg total) by mouth 2 (two) times daily. 09/25/16  Yes Ravi, Himabindu, MD  Cholecalciferol (VITAMIN D PO) Take by mouth.   Yes [provider]  clopidogrel (PLAVIX) 75 MG tablet Take 1 tablet (75 mg total) by mouth daily. 07/09/16  Yes Wellington Hampshire, MD  cyclobenzaprine (FLEXERIL) 10 MG tablet take 1 tablet by mouth three times a day if needed for muscle spasm 03/06/16  Yes Arnett, Yvetta Coder, FNP  insulin NPH Human (NOVOLIN N RELION) 100 UNIT/ML injection Inject 40 units in am and 20 units in the evening 10/22/16  Yes Philemon Kingdom, MD  insulin regular (NOVOLIN R,HUMULIN R) 250 units/2.56mL (100 units/mL) injection Inject 0.1-0.3 mLs (10-30 Units total) into the skin 3 (three) times daily before meals. 10/22/16  Yes Philemon Kingdom, MD  ketoconazole (NIZORAL) 2 % cream apply to affected area AS NEEDED FOR IRRITATION 04/14/15  Yes [provider]  losartan (COZAAR) 25 MG tablet take 1 tablet by mouth once daily 06/02/14  Yes Jackolyn Confer, MD  MAGNESIUM PO Take by mouth.   Yes [provider]  nitroGLYCERIN (NITROSTAT) 0.4 MG SL tablet Place 1 tablet (0.4 mg total) under the tongue every 5 (five) minutes as needed. 06/24/12  Yes Arida, Mertie Clause, MD  NOVOLIN N RELION 100 UNIT/ML injection INJECT 40 UNITS IN THE MORNING AND 20 UNITS IN THE EVENING 10/22/16  Yes Philemon Kingdom, MD  omeprazole (PRILOSEC) 20 MG capsule Take 1 capsule (20 mg total) by mouth 2 (two) times daily. Patient taking differently: Take 20 mg by mouth 2 (two) times daily as needed (HEARTBURN).  09/24/13  Yes Pyrtle, Lajuan Lines, MD  simvastatin (ZOCOR) 20 MG tablet take 1 tablet by mouth once daily AT 6PM 09/12/16  Yes [name], MD  tamsulosin [facility]) 0.4 MG CAPS capsule take 2 capsule by mouth daily 06/18/16  Yes Festus Aloe, MD  topiramate (TOPAMAX) 50 MG tablet 1 tablet in the morning, 2 tablets at  night 08/15/16  Yes Tat, Wells Guiles S, DO  triamcinolone ointment (KENALOG) 0.1 % apply to affected area twice a day AVOID FACE, GROIN AND UNDERARMS 03/10/15  Yes [provider]  carvedilol (COREG) 12.5 MG tablet Take 1 tablet (12.5 mg total) by mouth 2 (two) times daily. 04/09/16 10/12/16  Wellington Hampshire, MD  doxycycline (VIBRAMYCIN) 100 MG capsule Take 1 capsule (100 mg total) by mouth 2 (two) times daily. 12/06/16   Kenton Kingfisher,  Juanda Bond, PA-C  mupirocin cream (BACTROBAN) 2 % Apply 1 application topically 2 (two) times daily. 12/06/16   Luvenia Redden, PA-C   Meds Ordered and Administered this Visit  Medications - No data to display  BP (!) 162/82 (BP Location: Left Arm)   Pulse 81   Temp 98 F (36.7 C) (Oral)   Resp 18   Ht 5\' 6"  (1.676 m)   Wt 243 lb (110.2 kg)   SpO2 97%   BMI 39.22 kg/m  No data found.   Physical Exam  Constitutional: He is oriented to person, place, and time. He appears well-developed and well-nourished.  HENT:  Head:    Eyes: EOM are normal. Pupils are equal, round, and reactive to light.  Neck: Normal range of motion. Neck supple.  Cardiovascular: Normal rate, regular rhythm and normal heart sounds.   Pulmonary/Chest: Effort normal and breath sounds normal.  Abdominal: Soft.  Musculoskeletal: Normal range of motion.  Neurological: He is alert and oriented to person, place, and time.  Skin: Skin is warm and dry.    Urgent Care Course     .Marland KitchenIncision and Drainage Date/Time: 12/06/2016 2:55 PM Performed by: Denman George D Authorized by: Frederich Cha   Consent:    Consent obtained:  Verbal   Consent given by:  Patient   Risks discussed:  Bleeding, incomplete drainage, pain and infection Location:    Type:  Abscess   Size:  Induration of ~ 1cm, small scab with pus underlyinh   Location:  Head   Head location:  Face Pre-procedure details:    Skin preparation:  Chloraprep (alcohol swab) Anesthesia (see MAR for exact dosages):     Anesthesia method:  None Procedure type:    Complexity:  Simple Procedure details:    Needle aspiration: no     Incision types:  Stab incision   Incision depth:  Dermal   Scalpel blade:  11   Drainage:  Bloody and purulent   Drainage amount:  Scant   Wound treatment:  Wound left open   Packing materials:  None Post-procedure details:    Patient tolerance of procedure:  Tolerated well, no immediate complications Comments:     Covered with antibiotic ointment and bandage.   (including critical care time)  Labs Review Labs Reviewed  AEROBIC CULTURE (SUPERFICIAL SPECIMEN)    Imaging Review No results found.   MDM   1. Abscess of face    Discharge Medication List as of 12/06/2016 11:32 AM    START taking these medications   Details  doxycycline (VIBRAMYCIN) 100 MG capsule Take 1 capsule (100 mg total) by mouth 2 (two) times daily., Starting Thu 12/06/2016, Normal    mupirocin cream (BACTROBAN) 2 % Apply 1 application topically 2 (two) times daily., Starting Thu 12/06/2016, Normal        A small abscess to the  and right cheek over the zygomatic arch. Patient reports 2 days of pain and induration which is worsened. Patient has not taken anything for pain or applied any antibiotic ointment. Patient is without a been a pimple or a ingrown hair. Patient with a small area of erythema about a once meter area of induration with a small scab with pus underlying. A small incision was made through the scab and a minimal amount of pus was expressed and a culture was obtained. Area was treated with an antibiotic ointment and covered with a bandage.   Luvenia Redden, PA-C    Luvenia Redden, PA-C  12/06/16 1458  

## 2016-12-06 NOTE — Discharge Instructions (Signed)
-   Bactroban cream twice daily with dressing changes - Keep clean with warm soapy water - Doxycycline twice daily for 10 days - can continue warm compresses - follow up with PCP or this clinic if symptoms worsen or not improve.

## 2016-12-06 NOTE — ED Triage Notes (Signed)
Pt c/o raised red area on the right side of his face. He thought it was an ingrown hair, but it is very painful.

## 2016-12-09 LAB — AEROBIC CULTURE  (SUPERFICIAL SPECIMEN)

## 2016-12-09 LAB — AEROBIC CULTURE W GRAM STAIN (SUPERFICIAL SPECIMEN)

## 2016-12-12 ENCOUNTER — Ambulatory Visit (INDEPENDENT_AMBULATORY_CARE_PROVIDER_SITE_OTHER): Payer: Medicare Other | Admitting: Psychology

## 2016-12-12 DIAGNOSIS — F4312 Post-traumatic stress disorder, chronic: Secondary | ICD-10-CM

## 2016-12-20 NOTE — Progress Notes (Signed)
[name] [name] was seen today in neurologic consultation at the request of [name], MD.   The patient presents today for consultation regarding abnormal MRI of the brain.  This was done because of headache.  The patient is a 61 y.o. year old male with a history of headache.  The patient reports that headache began about 3 months ago.  The location of the headache is in the posterior occiput.  He didn't consider himself a headachey type of person before 3 months ago.  It feels like a sledgehammer.  There is photophobia.  There is lightheadedness and he doesn't drive because of it.  There is no phonophobia.  The patient reports that there is no associated nausea or emesis. He reports soreness of the occiput to the touch.  The headache radiates to the frontal region bilaterally.  The patient reports no autonomic features such as rhinorrhea, conjunctival erythema, or tearing of the eyes.  Balance has not changed.  He doesn't work outside of the home and states that he is disabled from neuropathy and sleep apnea.  Admits to occasional diplopia but states that he had bells palsy 4 different times and has chronic L facial droop and diplopia started then.  States that he first had bells in his 6's and last episode was 2 years ago.  States that he is taking flexeril for the headaches and takes 2 per day.  Admits to OSAS and wears CPAP faithfully.  Had it adjusted 1 year ago and states that pressure decreased from 18 to 14.    I had the opportunity to review the patient's MRI of the brain with and without gadolinium done on 09/30/2015.  There was mild temporal atrophy and a few rare scattered T2 hyperintensities.  The radiology report said that the ventricles were slightly enlarged, but admitted that it could be ex vacuo, but also said could not rule out NPH.  02/01/16 update:  The patient follows up today.  I have reviewed numerous records since last visit.  He had an occipital nerve block on March 21 and  April 3 and these did not provide long-term benefit.  On April 10 and started him on Topamax.  He called me 2 weeks later to state that it did not help.  I explained to the patient that he needed to give it much more time.  The patient states that headaches are much better but admits that he is only taking topamax prn.  He rarely has headache.   I did do lab work several months ago at our last visit and there were several abnormalities.  His sedimentation rate was very high at 90.  He was sent to the rheumatology office and this was repeated and it was within ranges of normal, but his CRP was very elevated at 77.  He was diagnosed with psoriatic arthritis at the rheumatology office and started on Remicade.  His rash is much better and joint pain is much better.  Since our last visit, his brother was found dead in his home which was obviously distressing for him.  He apparently had been dead for quite some time.  He was 61 y/o.    08/15/16 update: Patient comes in today to follow-up after an emergency room visit on 07/02/2016.  I reviewed those records.  He first went to the emergency room after he had gone to his psychiatrist and his blood pressure was elevated with a systolic in the 850Y and some headache along with "  worsening left facial droop" (patient has baseline left facial droop from history of Bell's palsy) and left arm tingling.  Pt completely denies there was any worsening facial droop today, stating that he told the ER that there was stable facial droop from bells palsy.  He denies headache as well and states that he was only there because his BP was high at his doctors office and he was told to go to the hospital because of that.   States that headache did develop in the ER from being frustrated with lack of care.  Admits to L arm tingling but that has been going on a year, off and on and he thinks that is related to a pinched nerve.  It involves the entire arm and all the fingers.  "I had it all day  yesterday."  MRI was done and I reviewed that.  While there is a very small DWI positive area in the right occipital region, seen both on the axial and coronal images, this was not seen on the ADC mapping.  Antiplatelet therapy was recommended.  He was already on an ASA, 81 mg daily faithfully.  He was also on Plavix at the time.  Pt states that it wasn't changed at the hospital.  However, neurology felt that this likely represented complicated migraine, as opposed to stroke.  He does have hx of headache.  Were gone for a while.  Now in the bilateral temporal region.  Feels throbbing.  Off of topamax.  Was on 100 mg daily.    12/31/16 update: Patient seen today in follow-up.  Last visit, I had him restart his Topamax.  He worked up to 50 mg in the morning and 100 mg at night.   He notes more headaches recently but thinks that some of it is blood pressure and weather related.  He had a headache since last Thursday but it is gone now.  "I was doing okay until I had to put my dog down.  My dog was like my kid."   It develops during the day if he gets a headache.  It is bilateral frontal.  No caffeine use.  Only water use.    Trying to lose weight and be more health conscious; had lost weight but looks like gained it back - 13 lbs - over last month.  Still faithful with cpap for OSAS.  Still on zocor and labs rechecked in Jan and I reviewed them.  The records that were made available to me were reviewed.  Seeing psychiatry for mood.  Recently seen in ED for small facial absess and treated and resolved.    ALLERGIES:   Allergies  Allergen Reactions  . Contrast Media [Iodinated Diagnostic Agents] Rash and Other (See Comments)    Got very hot and red   . Glipizide Other (See Comments)    ANTIDIABETICS. Burning  . Metrizamide Other (See Comments)    Got very hot and red  . Penicillins Hives and Swelling    CURRENT MEDICATIONS:  Current Outpatient Prescriptions on File Prior to Visit  Medication Sig  Dispense Refill  . ACCU-CHEK FASTCLIX LANCETS MISC Use to check sugar 3 times daily. 102 each 3  . albuterol (PROAIR HFA) 108 (90 Base) MCG/ACT inhaler Inhale 2 puffs into the lungs every 6 (six) hours as needed for shortness of breath.     [name] aspirin 81 MG tablet Take 81 mg by mouth daily.    [name] buPROPion (WELLBUTRIN XL) 300 MG 24 hr  tablet Take 1 tablet (300 mg total) by mouth daily. 30 tablet 2  . busPIRone (BUSPAR) 10 MG tablet Take 2 tablets (20 mg total) by mouth 2 (two) times daily. 120 tablet 4  . Cholecalciferol (VITAMIN D PO) Take by mouth.    . clopidogrel (PLAVIX) 75 MG tablet Take 1 tablet (75 mg total) by mouth daily. 90 tablet 3  . cyclobenzaprine (FLEXERIL) 10 MG tablet take 1 tablet by mouth three times a day if needed for muscle spasm 30 tablet 0  . doxycycline (VIBRAMYCIN) 100 MG capsule Take 1 capsule (100 mg total) by mouth 2 (two) times daily. 20 capsule 0  . insulin NPH Human (NOVOLIN N RELION) 100 UNIT/ML injection Inject 40 units in am and 20 units in the evening 40 mL 2  . insulin regular (NOVOLIN R,HUMULIN R) 250 units/2.33mL (100 units/mL) injection Inject 0.1-0.3 mLs (10-30 Units total) into the skin 3 (three) times daily before meals. 40 mL 5  . ketoconazole (NIZORAL) 2 % cream apply to affected area AS NEEDED FOR IRRITATION  0  . losartan (COZAAR) 25 MG tablet take 1 tablet by mouth once daily 90 tablet 3  . MAGNESIUM PO Take by mouth.    . mupirocin cream (BACTROBAN) 2 % Apply 1 application topically 2 (two) times daily. 15 g 0  . nitroGLYCERIN (NITROSTAT) 0.4 MG SL tablet Place 1 tablet (0.4 mg total) under the tongue every 5 (five) minutes as needed. 25 tablet 1  . NOVOLIN N RELION 100 UNIT/ML injection INJECT 40 UNITS IN THE MORNING AND 20 UNITS IN THE EVENING 40 mL 2  . omeprazole (PRILOSEC) 20 MG capsule Take 1 capsule (20 mg total) by mouth 2 (two) times daily. (Patient taking differently: Take 20 mg by mouth 2 (two) times daily as needed (HEARTBURN). ) 20 capsule  0  . simvastatin (ZOCOR) 20 MG tablet take 1 tablet by mouth once daily AT 6PM 90 tablet 3  . tamsulosin (FLOMAX) 0.4 MG CAPS capsule take 2 capsule by mouth daily 60 capsule 11  . topiramate (TOPAMAX) 50 MG tablet 1 tablet in the morning, 2 tablets at night 90 tablet 5  . triamcinolone ointment (KENALOG) 0.1 % apply to affected area twice a day AVOID FACE, GROIN AND UNDERARMS  0  . carvedilol (COREG) 12.5 MG tablet Take 1 tablet (12.5 mg total) by mouth 2 (two) times daily. 60 tablet 11   No current facility-administered medications on file prior to visit.     PAST MEDICAL HISTORY:   Past Medical History:  Diagnosis Date  . Adenomatous colon polyp   . Allergy   . Angina at rest [facility])    chronic  . Anxiety   . Arthritis   . Bell's palsy 2007  . Carotid artery occlusion   . Cataract    Dr. Dawna Part  . Coronary artery disease   . Depression   . Diabetes mellitus   . Diverticulosis   . Duodenitis   . DVT (deep venous thrombosis) ([facility])    in leg  . Gastropathy   . Heart murmur   . Helicobacter pylori gastritis   . Hyperlipidemia   . Hypertension   . Mild aortic stenosis   . Neuropathy   . Obesity   . Peripheral vascular disease ([place])   . Post splenectomy syndrome   . Psoriasis   . Psoriatic arthritis ([place])   . Sleep apnea   . SOB (shortness of breath)   . Stroke ([place])   .  Tobacco use disorder    recently quit  . Tubular adenoma 08/2013   Dr. Hilarie Fredrickson  . Weak urinary stream     PAST SURGICAL HISTORY:   Past Surgical History:  Procedure Laterality Date  . CARDIAC CATHETERIZATION  08/2010   LAD: 80% ISR, RCA: 80% ostial, OM 80-90%  . CAROTID ENDARTERECTOMY  08/2010   left/ Dr. Kellie Simmering  . COLONOSCOPY    . CORONARY ANGIOPLASTY     LAD: before CABG  . CORONARY ARTERY BYPASS GRAFT  09/06/2010   At [facility]: LIMA to LAD, left radial to RCA, sequential SVG to OM3 and 4  . heart stents  Jan 2011   leg stents 06/2009 and 03/2010  . PTA of illiac and SFA  multiple   Dr.  Ronalee Belts, s/p revision 11/2012  . SPLENECTOMY      SOCIAL HISTORY:   Social History   Social History  . Marital status: Married    Spouse name: N/A  . Number of children: 0  . Years of education: N/A   Occupational History  . 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 Smoking status: Light Tobacco Smoker    Packs/day: 0.75    Years: 40.00    Types: Cigarettes  . Smokeless tobacco: Never Used     Comment: smokes 2-3 cigarettes every 3 weeks  . Alcohol use No  . Drug use: No  . Sexual activity: No   Other Topics Concern  . Not on file   Social History Narrative  . No narrative on file    FAMILY HISTORY:   Family Status  Relation Status  . Father Deceased       lung cancer  . Sister Deceased       breast cancer  . Mother Alive       dementia, alzheimer's  . Brother Deceased at age 62       found dead in home  . Sister Deceased       seizures  . PGF (Not Specified)  . Sister Deceased  . Sister (Not Specified)  . Brother Deceased    ROS:  A complete 10 system review of systems was obtained and was unremarkable apart from what is mentioned above.  PHYSICAL EXAMINATION:    VITALS:   Vitals:   12/31/16 0752  BP: 138/82  Pulse: 78  SpO2: 97%  Weight: 256 lb (116.1 kg)  Height: 5\' 6"  (1.676 m)   Wt Readings from Last 3 Encounters:  12/31/16 256 lb (116.1 kg)  12/24/16 251 lb (113.9 kg)  12/06/16 243 lb (110.2 kg)     GEN:  Appears stated age and in NAD. HEENT:  Normocephalic, atraumatic. The mucous membranes are moist. The superficial temporal arteries are without ropiness or tenderness.  There is tenderness over bilateral occipital notches Cardiovascular: Regular rate and rhythm. Lungs: Clear to auscultation bilaterally. Neck: There are no carotid bruits noted bilaterally. Skin:  The prior psoriasis scales on the elbows are virtually gone.  NEUROLOGICAL: Orientation:  Pt is alert and  oriented x 3.  Cranial nerves: There is mild L facial droop with decreased blink on the L and decreased palpebral fissure on the L.  Speech is fluent and clear. Soft palate rises symmetrically and there is no tongue deviation. Hearing is intact to conversational tone. Tone: Tone is good throughout. Sensation: Sensation is intact to light touch and pinprick throughout (facial, extremities, trunk). Vibration is  intact at the bilateral big toe but it is decreased. There is no extinction with double simultaneous stimulation. There is no sensory dermatomal level identified. Coordination:  The patient has no difficulty with RAM's or FNF bilaterally. Motor: Strength is 5/5 in the bilateral upper and lower extremities. Shoulder shrug is equal and symmetric.  There is no pronator drift.  There are no fasciculations noted. Gait and Station: The patient is able to ambulate without difficulty.    LABS:  Lab Results  Component Value Date   WBC 7.6 07/02/2016   HGB 14.3 07/02/2016   HCT 42.0 07/02/2016   MCV 94.8 07/02/2016   PLT 150 07/02/2016     Chemistry      Component Value Date/Time   NA 139 07/02/2016 1120   NA 138 05/04/2014 0816   K 5.4 (H) 07/02/2016 1120   K 4.1 05/04/2014 0816   CL 106 07/02/2016 1120   CL 101 05/04/2014 0816   CO2 24 07/02/2016 1109   CO2 29 05/04/2014 0816   BUN 32 (H) 07/02/2016 1120   BUN 24 (H) 05/04/2014 0816   CREATININE 1.60 (H) 07/02/2016 1120   CREATININE 1.78 (H) 05/04/2014 0816      Component Value Date/Time   CALCIUM 8.9 07/02/2016 1109   CALCIUM 9.3 05/04/2014 0816   ALKPHOS 92 07/02/2016 1109   AST 14 (L) 07/02/2016 1109   ALT 13 (L) 07/02/2016 1109   BILITOT 0.6 07/02/2016 1109      Lab Results  Component Value Date   HGBA1C 6.2 12/24/2016    Lab Results  Component Value Date   ANA POS (A) 10/05/2015   RF 34 (H) 10/05/2015    Lab Results  Component Value Date   ANA POS (A) 10/05/2015    Lab Results  Component Value Date    ESRSEDRATE 90 (H) 10/05/2015   Lab Results  Component Value Date   CHOL 118 08/16/2016   HDL 29.60 (L) 08/16/2016   LDLCALC 55 12/13/2015   LDLDIRECT 54.0 08/16/2016   TRIG 227.0 (H) 08/16/2016   CHOLHDL 4 08/16/2016     IMPRESSIONS/PLAN:  1. Abnormal brain scan (06/2016)  -The patient certainly did have a DWI positive lesion on his brain scan, but without ADC correlate.  I am not sure that this completely correlated with symptoms, but agreed with the neurologist at the hospital but I would recommend antiplatelet therapy.  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   - We discussed signs and sx's of stroke and importance of calling 911 immediately should he have any of these.  Patient education was provided.    -Talked about stroke risk factors which include DM, HTN, hyperlipidemia, previous hx of stroke, sleep apnea.  Talked about those risk factors which are modifiable.  -Talked about importance of blood pressure control with a goal <130/80 mm Hg.   -Talked about importance of lipid control and proper diet.  Lipids should be managed intensively, with a goal LDL < 70 mg/dL.  Last LDL was 55 in 07/2016.    Currently on zocor 20 mg daily.    -I counseled the patient on measures to reduce stroke risk, including the importance of medication compliance, risk factor control, exercise, healthy diet, and avoidance of smoking.  His HDL was only 24.6 and talked about raising that through exercise.    -carotid u/s done in 03/2016 demonstrated 40-59% on the right.  He already has yearly appt set up for sept.  -  has sleep apnea which is risk factor and he is very faithful with that, even when using taking naps.   2.  Psoriatic arthritis  -on remicade.  Seeing Dr. Trudie Reed and doing markedly better  3.  Headache  -increase topamax to 100 mg bid.  Risks, benefits, side effects and alternative therapies were discussed.  The opportunity to ask questions was given and they were  answered to the best of my ability.  The patient expressed understanding and willingness to follow the outlined treatment protocols.  4.  F/u 4-5 months.  Much greater than 50% of this visit was spent in counseling and coordinating care.  Total face to face time:  25 min

## 2016-12-24 ENCOUNTER — Ambulatory Visit (INDEPENDENT_AMBULATORY_CARE_PROVIDER_SITE_OTHER): Payer: Medicare Other | Admitting: Internal Medicine

## 2016-12-24 ENCOUNTER — Encounter: Payer: Self-pay | Admitting: Internal Medicine

## 2016-12-24 VITALS — BP 132/70 | HR 70 | Ht 68.5 in | Wt 251.0 lb

## 2016-12-24 DIAGNOSIS — I6521 Occlusion and stenosis of right carotid artery: Secondary | ICD-10-CM

## 2016-12-24 DIAGNOSIS — E1151 Type 2 diabetes mellitus with diabetic peripheral angiopathy without gangrene: Secondary | ICD-10-CM | POA: Diagnosis not present

## 2016-12-24 DIAGNOSIS — Z794 Long term (current) use of insulin: Secondary | ICD-10-CM

## 2016-12-24 LAB — POCT GLYCOSYLATED HEMOGLOBIN (HGB A1C): Hemoglobin A1C: 6.2

## 2016-12-24 NOTE — Progress Notes (Signed)
Patient ID: [name] [name], male   DOB: April 03, 1956, 61 y.o.   MRN: [id number]  HPI: [name] [name] is a 61 y.o.-year-old male, returning for f/u for DM2, dx 2006, insulin-dependent since 2011, uncontrolled, with complications (CAD - CABG 2012, PVD - s/p Stents, Aortic and carotid stenosis, peripheral neuropathy). Last visit 3 mo ago.  He had a facial abscess >> was in the ED this mo.  Last hemoglobin A1c: Lab Results  Component Value Date   HGBA1C 5.9 06/11/2016   HGBA1C 6.2 03/06/2016   HGBA1C 6.5 12/13/2015   He is on: N and R insulins:  Insulin Before breakfast Before lunch Before dinner  Regular - clear (short acting) 20 units - smaller meal 25 units - regular meal 30 units - larger meal  10 units - smaller meal 15 units - regular meal 20 units - larger meal  NPH - cloudy (long acting) 40  20   We stopped Victoza at a previous visit. He could not afford his insulins (doughnut hole) - 400$.   Pt checks his sugars 3x a day (per review of his log): - am: 80-139 >> 81-131 >> 92, 102-147, 177 >> 83-148, 158, 162 - 2h after b'fast: 137-162, 198 >> 145-158 >> 173 >> n/c - before lunch: 152-180, 226 >> n/c >> 132-190, 220 >> n/c - 2h after lunch:  82-167, 215 >> 85-177 >> 75-147, 191 >> 91-155, 185 - before dinner: 69-137 >> 175 >> n/c >> n/c >> 164 >> 192 - 2h after dinner: 94-201, 225 >> 110-147, 226 >> 112-189, 200 >> 217 - bedtime: 109-139 >> n/c >> 130, 169 >> n/c No lows.. Lowest sugar was 50 x1 - before lunch, 69 x1 - after lunch >> 75 >> 83; ? hypoglycemia awareness. Highest sugar was 226 >> 217.  Pt's meals are: - Breakfast: toast + eggs or cereals + milk - Lunch: soup + 1/2 sandwich - Dinner: meat + sweet potatoes or pasta + salad - Snacks: 3: sugar free foods, fruit He quit drinking sodas, + tea- artificial sweetener. + Almond milk.  - He has CKD, last BUN/creatinine:  Lab Results  Component Value Date   BUN 32 (H) 07/02/2016   CREATININE 1.60 (H) 07/02/2016   On Cozaar.  Sees nephrology. Has a stone in L kidney, has 3 cysts in R kidney. - last set of lipids: Lab Results  Component Value Date   CHOL 118 08/16/2016   HDL 29.60 (L) 08/16/2016   LDLCALC 55 12/13/2015   LDLDIRECT 54.0 08/16/2016   TRIG 227.0 (H) 08/16/2016   CHOLHDL 4 08/16/2016  On Zocor. - last eye exam was in 11/2015 [facility] DR, + cataracts - he does have numbness and tingling in his feet - [place] [facility]). Dr. Elvina Mattes.  He had surgery for PVD stents on 04/27/2014. His sugars improved after the stent.   ROS: Constitutional: no weight gain/no weight loss, no fatigue, no subjective hyperthermia, no subjective hypothermia Eyes: no blurry vision, no xerophthalmia ENT: no sore throat, no nodules palpated in throat, no dysphagia, no odynophagia, no hoarseness Cardiovascular: no CP/no SOB/no palpitations/no leg swelling Respiratory: no cough/no SOB/no wheezing Gastrointestinal: no N/no V/no D/no C/no acid reflux Musculoskeletal: no muscle aches/no joint aches Skin: no rashes, no hair loss Neurological: no tremors/no numbness/no tingling/no dizziness  I reviewed pt's medications, allergies, PMH, social hx, family hx, and changes were documented in the history of present illness. Otherwise, unchanged from my initial visit note.  PE: BP 132/70 (  BP Location: Left Arm, Patient Position: Sitting)   Pulse 70   Ht 5' 8.5" (1.74 m)   Wt 251 lb (113.9 kg)   SpO2 98%   BMI 37.61 kg/m  Body mass index is 37.61 kg/m. Wt Readings from Last 3 Encounters:  12/24/16 251 lb (113.9 kg)  12/06/16 243 lb (110.2 kg)  10/12/16 252 lb (114.3 kg)   Constitutional: obese, in NAD Eyes: PERRLA, EOMI, no exophthalmos ENT: moist mucous membranes, no thyromegaly, no cervical lymphadenopathy Cardiovascular: RRR,  +3/6 SEM, no RG Respiratory: CTA B Gastrointestinal: abdomen soft, NT, ND, BS+ Musculoskeletal: no deformities, strength intact in all 4 Skin: moist, warm, no  rashes except rosacea on face Neurological: no tremor with outstretched hands, DTR normal in all 4  ASSESSMENT: 1. DM2, insulin-dependent, now controlled, with complications - CAD - s/p CABG 2012 - PVD - s/p stents 2012 - Aortic stenosis - carotid stenosis - CKD - peripheral neuropathy  - He read Dr Janene Harvey book: Program for Reversing Diabetes".  PLAN:  1. Patient with long-standing, now more controlled DM, with slightly higher sugars lately as he was stressed after his dog died recently. However, his sugars are still at or close to goal >> no changes in his regimen are needed. - I advised him to Patient Instructions  Please continue:  Insulin Before breakfast Before lunch Before dinner  Regular - clear (short acting) 20 units - smaller meal 25 units - regular meal 30 units - larger meal  10 units - smaller meal 15 units - regular meal 20 units - larger meal  NPH - cloudy (long acting) 40  20   Please come back for a follow-up appointment in 3 months.  - today, HbA1c is 6.2% (slightly higher but still at goal) - continue checking sugars at different times of the day - check 3x a day, rotating checks - advised for yearly eye exams >> he is UTD and has next exam coming up in November - Return to clinic in 3-4 mo with sugar log    [name] Kingdom, MD PhD [facility] Endocrinology

## 2016-12-24 NOTE — Patient Instructions (Addendum)
Please continue:  Insulin Before breakfast Before lunch Before dinner  Regular - clear (short acting) 20 units - smaller meal 25 units - regular meal 30 units - larger meal  10 units - smaller meal 15 units - regular meal 20 units - larger meal  NPH - cloudy (long acting) 40  20   Please come back for a follow-up appointment in 3 months.

## 2016-12-24 NOTE — Addendum Note (Signed)
Addended by: Caprice Beaver T on: 12/24/2016 08:16 AM   Modules accepted: Orders

## 2016-12-26 ENCOUNTER — Ambulatory Visit (INDEPENDENT_AMBULATORY_CARE_PROVIDER_SITE_OTHER): Payer: Medicare Other | Admitting: Psychology

## 2016-12-26 DIAGNOSIS — F4312 Post-traumatic stress disorder, chronic: Secondary | ICD-10-CM

## 2016-12-27 DIAGNOSIS — M0579 Rheumatoid arthritis with rheumatoid factor of multiple sites without organ or systems involvement: Secondary | ICD-10-CM | POA: Diagnosis not present

## 2016-12-31 ENCOUNTER — Encounter: Payer: Self-pay | Admitting: Neurology

## 2016-12-31 ENCOUNTER — Ambulatory Visit (INDEPENDENT_AMBULATORY_CARE_PROVIDER_SITE_OTHER): Payer: Medicare Other | Admitting: Neurology

## 2016-12-31 VITALS — BP 138/82 | HR 78 | Ht 66.0 in | Wt 256.0 lb

## 2016-12-31 DIAGNOSIS — I6521 Occlusion and stenosis of right carotid artery: Secondary | ICD-10-CM | POA: Diagnosis not present

## 2016-12-31 DIAGNOSIS — G44229 Chronic tension-type headache, not intractable: Secondary | ICD-10-CM | POA: Diagnosis not present

## 2016-12-31 DIAGNOSIS — G4733 Obstructive sleep apnea (adult) (pediatric): Secondary | ICD-10-CM | POA: Diagnosis not present

## 2016-12-31 MED ORDER — TOPIRAMATE 100 MG PO TABS: 100.0000 mg | ORAL_TABLET | Freq: Two times a day (BID) | ORAL | 5 refills | Status: DC

## 2016-12-31 NOTE — Patient Instructions (Signed)
Increase topamax to 100 mg, one tablet twice per day  Good to see you.  See you in 6 months!

## 2017-01-02 ENCOUNTER — Ambulatory Visit: Payer: Medicare Other | Admitting: Psychology

## 2017-01-08 ENCOUNTER — Other Ambulatory Visit: Payer: Self-pay | Admitting: Psychiatry

## 2017-01-09 ENCOUNTER — Ambulatory Visit (INDEPENDENT_AMBULATORY_CARE_PROVIDER_SITE_OTHER): Payer: Medicare Other | Admitting: Psychology

## 2017-01-09 DIAGNOSIS — F4312 Post-traumatic stress disorder, chronic: Secondary | ICD-10-CM

## 2017-01-11 NOTE — Telephone Encounter (Signed)
received a fax requesting a refill on bupropion hcl xl 300mg . pt has appt for  01-29-17 last seen on 10-30-16.  rx was last written on  09-25-16 with 2 refills. pt needs enough medications to get to next appt.

## 2017-01-14 ENCOUNTER — Ambulatory Visit: Payer: Self-pay | Admitting: Neurology

## 2017-01-29 ENCOUNTER — Encounter: Payer: Self-pay | Admitting: Psychiatry

## 2017-01-29 ENCOUNTER — Ambulatory Visit (INDEPENDENT_AMBULATORY_CARE_PROVIDER_SITE_OTHER): Payer: Medicare Other | Admitting: Psychiatry

## 2017-01-29 VITALS — BP 134/72 | HR 68 | Temp 97.7°F | Wt 246.0 lb

## 2017-01-29 DIAGNOSIS — F331 Major depressive disorder, recurrent, moderate: Secondary | ICD-10-CM

## 2017-01-29 DIAGNOSIS — F411 Generalized anxiety disorder: Secondary | ICD-10-CM

## 2017-01-29 DIAGNOSIS — I6521 Occlusion and stenosis of right carotid artery: Secondary | ICD-10-CM | POA: Diagnosis not present

## 2017-01-29 MED ORDER — SERTRALINE HCL 100 MG PO TABS: 150.0000 mg | ORAL_TABLET | Freq: Every day | ORAL | 2 refills | Status: DC

## 2017-01-29 MED ORDER — BUPROPION HCL ER (XL) 300 MG PO TB24: 300.0000 mg | ORAL_TABLET | Freq: Every day | ORAL | 2 refills | Status: DC

## 2017-01-29 MED ORDER — BUSPIRONE HCL 10 MG PO TABS: 20.0000 mg | ORAL_TABLET | Freq: Two times a day (BID) | ORAL | 4 refills | Status: DC

## 2017-01-29 NOTE — Progress Notes (Signed)
Patient ID: [name] [name], male   DOB: [birth year], 61 y.o.   MRN: [id number] [facility] MD/PA/NP OP Progress Note  01/29/2017 10:32 AM GIANNO VOLNER  MRN:  [id number]  Subjective:  Patient returns for follow-up of generalized anxiety disorder and depression. Patient reports that he doing quite well. His mood has significantly improved, he has been steadily losing weight and quite happy about it. He is making some trips this summer and looking forward to them. Overall things are going well for him. Denies any side effects. Fair sleep and appetite. Patient working with his Nephrologist on his blood pressure.  Chief Complaint: doing well  Visit Diagnosis:     ICD-10-CM   1. GAD (generalized anxiety disorder) F41.1   2. Major depressive disorder, recurrent episode, moderate (HCC) F33.1     Past Medical History:  Past Medical History:  Diagnosis Date  . Adenomatous colon polyp   . Allergy   . Angina at rest [facility])    chronic  . Anxiety   . Arthritis   . Bell's palsy 2007  . Carotid artery occlusion   . Cataract    Dr. Dawna Part  . Coronary artery disease   . Depression   . Diabetes mellitus   . Diverticulosis   . Duodenitis   . DVT (deep venous thrombosis) (HCC)    in leg  . Gastropathy   . Heart murmur   . Helicobacter pylori gastritis   . Hyperlipidemia   . Hypertension   . Mild aortic stenosis   . Neuropathy   . Obesity   . Peripheral vascular disease ([place])   . Post splenectomy syndrome   . Psoriasis   . Psoriatic arthritis ([place])   . Sleep apnea   . SOB (shortness of breath)   . Stroke ([place])   . Tobacco use disorder    recently quit  . Tubular adenoma 08/2013   Dr. Hilarie Fredrickson  . Weak urinary stream     Past Surgical History:  Procedure Laterality Date  . CARDIAC CATHETERIZATION  08/2010   LAD: 80% ISR, RCA: 80% ostial, OM 80-90%  . CAROTID ENDARTERECTOMY  08/2010   left/ Dr. Kellie Simmering  . COLONOSCOPY    . CORONARY ANGIOPLASTY     LAD: before CABG  . CORONARY ARTERY  BYPASS GRAFT  09/06/2010   At [facility]: LIMA to LAD, left radial to RCA, sequential SVG to OM3 and 4  . heart stents  Jan 2011   leg stents 06/2009 and 03/2010  . PTA of illiac and SFA  multiple   Dr. Ronalee Belts, s/p revision 11/2012  . SPLENECTOMY     Family History:  Family History  Problem Relation Age of Onset  . Lung cancer Father 52  . Arthritis Father   . Breast cancer Sister 19  . Alcoholism Sister   . Dementia Mother   . Alzheimer's disease Mother   . Arthritis Mother   . Alcoholism Brother   . Epilepsy Sister   . Diabetes Paternal Grandfather   . Colon polyps Paternal Grandfather 60  . Alcoholism Sister   . Alcoholism Sister   . Alcoholism Brother    Social History:  Social History   Social History  . Marital status: Married    Spouse name: N/A  . Number of children: 0  . Years of education: N/A   Occupational History  . 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 Smoking status:  Light Tobacco Smoker    Packs/day: 0.75    Years: 40.00    Types: Cigarettes  . Smokeless tobacco: Never Used     Comment: smokes 2-3 cigarettes every 3 weeks  . Alcohol use No  . Drug use: No  . Sexual activity: No   Other Topics Concern  . Not on file   Social History Narrative  . No narrative on file   Additional History:   Assessment:   Musculoskeletal: Strength & Muscle Tone: within normal limits Gait & Station: unsteady walks with cane Patient leans: N/A  Psychiatric Specialty Exam: Medication Refill   Anxiety  Patient reports no insomnia, nervous/anxious behavior or suicidal ideas.    Depression         Associated symptoms include does not have insomnia and no suicidal ideas.  Past medical history includes anxiety.     Review of Systems  Psychiatric/Behavioral: Negative.  Negative for depression, hallucinations, memory loss, substance abuse and suicidal ideas. The patient is not nervous/anxious and  does not have insomnia.   All other systems reviewed and are negative.   There were no vitals taken for this visit.There is no height or weight on file to calculate BMI.  General Appearance: Well Groomed  Eye Contact:  Good  Speech:  Normal Rate  Volume:  Normal  Mood:  Doing quite well  Affect:  Congruent  Thought Process:  Normal  Orientation:  Full (Time, Place, and Person)  Thought Content:  Negative  Suicidal Thoughts:  No  Homicidal Thoughts:  No  Memory:  Immediate;   Good Recent;   Good Remote;   Good  Judgement:  Good  Insight:  Good  Psychomotor Activity:  Negative  Concentration:  Good  Recall:  Good  Fund of Knowledge: Good  Language: Good  Akathisia:  Negative  Handed:  Right  AIMS (if indicated):  N/A  Assets:  Communication Skills Desire for Improvement Social Support  ADL's:  Intact  Cognition: WNL  Sleep:  Good    Is the patient at risk to self?  No. Has the patient been a risk to self in the past 6 months?  No. Has the patient been a risk to self within the distant past?  No. Is the patient a risk to others?  No. Has the patient been a risk to others in the past 6 months?  No. Has the patient been a risk to others within the distant past?  No.  Current Medications: Current Outpatient Prescriptions  Medication Sig Dispense Refill  . ACCU-CHEK FASTCLIX LANCETS MISC Use to check sugar 3 times daily. 102 each 3  . albuterol (PROAIR HFA) 108 (90 Base) MCG/ACT inhaler Inhale 2 puffs into the lungs every 6 (six) hours as needed for shortness of breath.     [name] aspirin 81 MG tablet Take 81 mg by mouth daily.    [name] buPROPion (WELLBUTRIN XL) 300 MG 24 hr tablet take 1 tablet by mouth once daily 30 tablet 0  . busPIRone (BUSPAR) 10 MG tablet Take 2 tablets (20 mg total) by mouth 2 (two) times daily. 120 tablet 4  . carvedilol (COREG) 12.5 MG tablet Take 1 tablet (12.5 mg total) by mouth 2 (two) times daily. 60 tablet 11  . Cholecalciferol (VITAMIN D PO) Take by  mouth.    . clopidogrel (PLAVIX) 75 MG tablet Take 1 tablet (75 mg total) by mouth daily. 90 tablet 3  . cyclobenzaprine (FLEXERIL) 10 MG tablet take 1 tablet by mouth  three times a day if needed for muscle spasm 30 tablet 0  . doxycycline (VIBRAMYCIN) 100 MG capsule Take 1 capsule (100 mg total) by mouth 2 (two) times daily. 20 capsule 0  . insulin NPH Human (NOVOLIN N RELION) 100 UNIT/ML injection Inject 40 units in am and 20 units in the evening 40 mL 2  . insulin regular (NOVOLIN R,HUMULIN R) 250 units/2.87mL (100 units/mL) injection Inject 0.1-0.3 mLs (10-30 Units total) into the skin 3 (three) times daily before meals. 40 mL 5  . ketoconazole (NIZORAL) 2 % cream apply to affected area AS NEEDED FOR IRRITATION  0  . losartan (COZAAR) 25 MG tablet take 1 tablet by mouth once daily 90 tablet 3  . MAGNESIUM PO Take by mouth.    . mupirocin cream (BACTROBAN) 2 % Apply 1 application topically 2 (two) times daily. 15 g 0  . nitroGLYCERIN (NITROSTAT) 0.4 MG SL tablet Place 1 tablet (0.4 mg total) under the tongue every 5 (five) minutes as needed. 25 tablet 1  . NOVOLIN N RELION 100 UNIT/ML injection INJECT 40 UNITS IN THE MORNING AND 20 UNITS IN THE EVENING 40 mL 2  . omeprazole (PRILOSEC) 20 MG capsule Take 1 capsule (20 mg total) by mouth 2 (two) times daily. (Patient taking differently: Take 20 mg by mouth 2 (two) times daily as needed (HEARTBURN). ) 20 capsule 0  . simvastatin (ZOCOR) 20 MG tablet take 1 tablet by mouth once daily AT 6PM 90 tablet 3  . tamsulosin (FLOMAX) 0.4 MG CAPS capsule take 2 capsule by mouth daily 60 capsule 11  . topiramate (TOPAMAX) 100 MG tablet Take 1 tablet (100 mg total) by mouth 2 (two) times daily. 60 tablet 5  . triamcinolone ointment (KENALOG) 0.1 % apply to affected area twice a day AVOID FACE, GROIN AND UNDERARMS  0   No current facility-administered medications for this visit.     Medical Decision Making:  Established Problem, Stable/Improving (1) and Review  of New Medication or Change in Dosage (2)  Treatment Plan Summary:Medication management and Plan   Depressive disorder, moderate, recurrent-  Continue Zoloft at 150mg  po daily. Continue  Wellbutrin XL 300 mg in the morning. Continue to see therapist, Dr. Cristy Folks.  Generalized anxiety disorder-stable We'll continue his BuSpar to 20 mg twice daily.    Patient urged to watch his blood pressure closely.  Patient will follow up in 3  months. Call before if needed   Almendra Loria 01/29/2017, 10:32 AM

## 2017-01-30 ENCOUNTER — Ambulatory Visit (INDEPENDENT_AMBULATORY_CARE_PROVIDER_SITE_OTHER): Payer: Medicare Other | Admitting: Psychology

## 2017-01-30 DIAGNOSIS — F4312 Post-traumatic stress disorder, chronic: Secondary | ICD-10-CM | POA: Diagnosis not present

## 2017-02-06 ENCOUNTER — Telehealth: Payer: Self-pay | Admitting: *Deleted

## 2017-02-06 ENCOUNTER — Telehealth: Payer: Self-pay

## 2017-02-06 ENCOUNTER — Ambulatory Visit: Payer: Medicare Other | Admitting: Psychology

## 2017-02-06 ENCOUNTER — Other Ambulatory Visit: Payer: Self-pay

## 2017-02-06 MED ORDER — ACCU-CHEK FASTCLIX LANCETS MISC: 5 refills | Status: DC

## 2017-02-06 NOTE — Telephone Encounter (Signed)
[facility] requested to know if the form [facility] was filled out, this allows [facility] to bill medicare Part B for test strips. Rx is not needed [employer]  [phone]

## 2017-02-06 NOTE — Telephone Encounter (Signed)
OK 

## 2017-02-06 NOTE — Telephone Encounter (Signed)
Called and spoke with pharmacy. They are sending the [place] form for [place] to fill out and fax back.

## 2017-02-06 NOTE — Telephone Encounter (Signed)
Submitted

## 2017-02-06 NOTE — Telephone Encounter (Signed)
Patient came by the office to discuss his lancets. Patient states right now it says to check 3 times daily, he states that sometimes the lancets are duds and do not always work correctly, he was wondering if we could change the RX to checking 6 times daily. He also request to send a year supply. Just a FYI for refill purposes it is okay to [facility] to the [facility] right now, but to change to [facility] in a week or two when we get notifications from them.

## 2017-02-07 DIAGNOSIS — M0579 Rheumatoid arthritis with rheumatoid factor of multiple sites without organ or systems involvement: Secondary | ICD-10-CM | POA: Diagnosis not present

## 2017-02-13 ENCOUNTER — Ambulatory Visit: Payer: Medicare Other | Admitting: Psychology

## 2017-02-19 DIAGNOSIS — E1142 Type 2 diabetes mellitus with diabetic polyneuropathy: Secondary | ICD-10-CM | POA: Diagnosis not present

## 2017-02-19 DIAGNOSIS — B351 Tinea unguium: Secondary | ICD-10-CM | POA: Diagnosis not present

## 2017-03-06 ENCOUNTER — Ambulatory Visit (INDEPENDENT_AMBULATORY_CARE_PROVIDER_SITE_OTHER): Payer: Medicare Other | Admitting: Psychology

## 2017-03-06 DIAGNOSIS — F4312 Post-traumatic stress disorder, chronic: Secondary | ICD-10-CM

## 2017-03-08 ENCOUNTER — Encounter: Payer: Medicare Other | Admitting: Internal Medicine

## 2017-03-13 ENCOUNTER — Ambulatory Visit (INDEPENDENT_AMBULATORY_CARE_PROVIDER_SITE_OTHER): Payer: Medicare Other | Admitting: Psychology

## 2017-03-13 DIAGNOSIS — F4312 Post-traumatic stress disorder, chronic: Secondary | ICD-10-CM

## 2017-03-15 ENCOUNTER — Encounter: Payer: Self-pay | Admitting: Internal Medicine

## 2017-03-15 ENCOUNTER — Ambulatory Visit (INDEPENDENT_AMBULATORY_CARE_PROVIDER_SITE_OTHER): Payer: Medicare Other | Admitting: Internal Medicine

## 2017-03-15 VITALS — BP 132/84 | HR 72 | Temp 97.5°F | Ht 67.5 in | Wt 246.0 lb

## 2017-03-15 DIAGNOSIS — F39 Unspecified mood [affective] disorder: Secondary | ICD-10-CM

## 2017-03-15 DIAGNOSIS — Z0000 Encounter for general adult medical examination without abnormal findings: Secondary | ICD-10-CM

## 2017-03-15 DIAGNOSIS — Z23 Encounter for immunization: Secondary | ICD-10-CM

## 2017-03-15 DIAGNOSIS — E1151 Type 2 diabetes mellitus with diabetic peripheral angiopathy without gangrene: Secondary | ICD-10-CM

## 2017-03-15 DIAGNOSIS — I779 Disorder of arteries and arterioles, unspecified: Secondary | ICD-10-CM

## 2017-03-15 DIAGNOSIS — I6521 Occlusion and stenosis of right carotid artery: Secondary | ICD-10-CM

## 2017-03-15 DIAGNOSIS — N183 Chronic kidney disease, stage 3 unspecified: Secondary | ICD-10-CM

## 2017-03-15 DIAGNOSIS — I739 Peripheral vascular disease, unspecified: Secondary | ICD-10-CM

## 2017-03-15 DIAGNOSIS — Z794 Long term (current) use of insulin: Secondary | ICD-10-CM | POA: Diagnosis not present

## 2017-03-15 DIAGNOSIS — E1122 Type 2 diabetes mellitus with diabetic chronic kidney disease: Secondary | ICD-10-CM

## 2017-03-15 DIAGNOSIS — N184 Chronic kidney disease, stage 4 (severe): Secondary | ICD-10-CM

## 2017-03-15 DIAGNOSIS — Z7189 Other specified counseling: Secondary | ICD-10-CM | POA: Diagnosis not present

## 2017-03-15 LAB — CBC WITH DIFFERENTIAL/PLATELET
Basophils Absolute: 0.1 10*3/uL (ref 0.0–0.1)
Basophils Relative: 1 % (ref 0.0–3.0)
Eosinophils Absolute: 0.2 10*3/uL (ref 0.0–0.7)
Eosinophils Relative: 3.7 % (ref 0.0–5.0)
HCT: 41 % (ref 39.0–52.0)
Hemoglobin: 13.5 g/dL (ref 13.0–17.0)
Lymphocytes Relative: 32.1 % (ref 12.0–46.0)
Lymphs Abs: 2.1 10*3/uL (ref 0.7–4.0)
MCHC: 33 g/dL (ref 30.0–36.0)
MCV: 96.3 fl (ref 78.0–100.0)
Monocytes Absolute: 0.4 10*3/uL (ref 0.1–1.0)
Monocytes Relative: 6.8 % (ref 3.0–12.0)
Neutro Abs: 3.6 10*3/uL (ref 1.4–7.7)
Neutrophils Relative %: 56.4 % (ref 43.0–77.0)
Platelets: 139 10*3/uL — ABNORMAL LOW (ref 150.0–400.0)
RBC: 4.26 Mil/uL (ref 4.22–5.81)
RDW: 14.6 % (ref 11.5–15.5)
WBC: 6.4 10*3/uL (ref 4.0–10.5)

## 2017-03-15 LAB — COMPREHENSIVE METABOLIC PANEL
ALT: 10 U/L (ref 0–53)
AST: 10 U/L (ref 0–37)
Albumin: 3.8 g/dL (ref 3.5–5.2)
Alkaline Phosphatase: 91 U/L (ref 39–117)
BUN: 29 mg/dL — ABNORMAL HIGH (ref 6–23)
CO2: 23 mEq/L (ref 19–32)
Calcium: 8.5 mg/dL (ref 8.4–10.5)
Chloride: 111 mEq/L (ref 96–112)
Creatinine, Ser: 2.09 mg/dL — ABNORMAL HIGH (ref 0.40–1.50)
GFR: 34.21 mL/min — ABNORMAL LOW (ref >60.00)
Glucose, Bld: 124 mg/dL — ABNORMAL HIGH (ref 70–99)
Potassium: 4.4 mEq/L (ref 3.5–5.1)
Sodium: 140 mEq/L (ref 135–145)
Total Bilirubin: 0.3 mg/dL (ref 0.2–1.2)
Total Protein: 7.1 g/dL (ref 6.0–8.3)

## 2017-03-15 LAB — LIPID PANEL
Cholesterol: 132 mg/dL (ref 0–200)
HDL: 22.3 mg/dL — ABNORMAL LOW (ref >39.00)
NonHDL: 109.34
Total CHOL/HDL Ratio: 6
Triglycerides: 264 mg/dL — ABNORMAL HIGH (ref 0.0–149.0)
VLDL: 52.8 mg/dL — ABNORMAL HIGH (ref 0.0–40.0)

## 2017-03-15 LAB — LDL CHOLESTEROL, DIRECT: Direct LDL: 61 mg/dL

## 2017-03-15 LAB — HM DIABETES FOOT EXAM

## 2017-03-15 NOTE — Assessment & Plan Note (Signed)
Doing well on his current regimen

## 2017-03-15 NOTE — Progress Notes (Signed)
Subjective:    Patient ID: [name] [name], male    DOB: 07-06-1956, 61 y.o.   MRN: [id number]  HPI Here for Medicare wellness visit and follow up of chronic health conditions Reviewed form and advanced directives Reviewed other doctors--updated list No alcohol Still smokes 1 cigarette a day--discussed and he is working on it Trying to exercise some Chronic mood issues--doing well now Independent with instrumental ADLs One fall--relates to the neuropathy. No injury (was distracted stepping off a curb) Vision is fine.  Hearing is okay-- high frequency loss noted on exam today  Was hospitalized for 18 months when young Thinks he got hepatitis C there Had positive antibody--but negative RNA Reassured   Diabetes controlled Neuropathy persists--not too bad. Heat and ice may help--especially after walking Tries to walk 30 minutes per day if he can Still has trouble at times--still needs the handicapped permit at times  Still gets the remicade from Dr Trudie Reed This is controlling the psoriasis and psoriatic arthritis  No chest pain No SOB No dizziness or syncope No edema Sees Dr Brantley Persons follow up carotid test coming up  Sees psychiatrist and counselor (Dr Rexene Edison) Depression and anxiety are controlled on his regimen  Current Outpatient Prescriptions on File Prior to Visit  Medication Sig Dispense Refill  . ACCU-CHEK FASTCLIX LANCETS MISC Use to check sugar 6 times daily. 600 each 5  . albuterol (PROAIR HFA) 108 (90 Base) MCG/ACT inhaler Inhale 2 puffs into the lungs every 6 (six) hours as needed for shortness of breath.     [name] aspirin 81 MG tablet Take 81 mg by mouth daily.    [name] buPROPion (WELLBUTRIN XL) 300 MG 24 hr tablet Take 1 tablet (300 mg total) by mouth daily. 30 tablet 2  . busPIRone (BUSPAR) 10 MG tablet Take 2 tablets (20 mg total) by mouth 2 (two) times daily. 120 tablet 4  . Cholecalciferol (VITAMIN D PO) Take by mouth.    . clopidogrel (PLAVIX) 75 MG tablet  Take 1 tablet (75 mg total) by mouth daily. 90 tablet 3  . cyclobenzaprine (FLEXERIL) 10 MG tablet take 1 tablet by mouth three times a day if needed for muscle spasm 30 tablet 0  . insulin NPH Human (NOVOLIN N RELION) 100 UNIT/ML injection Inject 40 units in am and 20 units in the evening 40 mL 2  . insulin regular (NOVOLIN R,HUMULIN R) 250 units/2.58mL (100 units/mL) injection Inject 0.1-0.3 mLs (10-30 Units total) into the skin 3 (three) times daily before meals. 40 mL 5  . ketoconazole (NIZORAL) 2 % cream apply to affected area AS NEEDED FOR IRRITATION  0  . losartan (COZAAR) 25 MG tablet take 1 tablet by mouth once daily 90 tablet 3  . MAGNESIUM PO Take by mouth.    . mupirocin cream (BACTROBAN) 2 % Apply 1 application topically 2 (two) times daily. 15 g 0  . nitroGLYCERIN (NITROSTAT) 0.4 MG SL tablet Place 1 tablet (0.4 mg total) under the tongue every 5 (five) minutes as needed. 25 tablet 1  . NOVOLIN N RELION 100 UNIT/ML injection INJECT 40 UNITS IN THE MORNING AND 20 UNITS IN THE EVENING 40 mL 2  . omeprazole (PRILOSEC) 20 MG capsule Take 1 capsule (20 mg total) by mouth 2 (two) times daily. (Patient taking differently: Take 20 mg by mouth 2 (two) times daily as needed (HEARTBURN). ) 20 capsule 0  . sertraline (ZOLOFT) 100 MG tablet Take 1.5 tablets (150 mg total) by mouth daily. 45 tablet  2  . simvastatin (ZOCOR) 20 MG tablet take 1 tablet by mouth once daily AT 6PM 90 tablet 3  . tamsulosin (FLOMAX) 0.4 MG CAPS capsule take 2 capsule by mouth daily 60 capsule 11  . topiramate (TOPAMAX) 100 MG tablet Take 1 tablet (100 mg total) by mouth 2 (two) times daily. 60 tablet 5  . triamcinolone ointment (KENALOG) 0.1 % apply to affected area twice a day AVOID FACE, GROIN AND UNDERARMS  0  . carvedilol (COREG) 12.5 MG tablet Take 1 tablet (12.5 mg total) by mouth 2 (two) times daily. 60 tablet 11   No current facility-administered medications on file prior to visit.     Allergies  Allergen  Reactions  . Contrast Media [Iodinated Diagnostic Agents] Rash and Other (See Comments)    Got very hot and red   . Glipizide Other (See Comments)    ANTIDIABETICS. Burning  . Metrizamide Other (See Comments)    Got very hot and red  . Penicillins Hives and Swelling    Past Medical History:  Diagnosis Date  . Adenomatous colon polyp   . Allergy   . Angina at rest [facility])    chronic  . Anxiety   . Arthritis   . Bell's palsy 2007  . Carotid artery occlusion   . Cataract    Dr. Dawna Part  . Coronary artery disease   . Depression   . Diabetes mellitus   . Diverticulosis   . Duodenitis   . DVT (deep venous thrombosis) (HCC)    in leg  . Gastropathy   . Heart murmur   . Helicobacter pylori gastritis   . Hyperlipidemia   . Hypertension   . Mild aortic stenosis   . Neuropathy   . Obesity   . Peripheral vascular disease ([place])   . Post splenectomy syndrome   . Psoriasis   . Psoriatic arthritis ([place])   . Sleep apnea   . SOB (shortness of breath)   . Stroke ([place])   . Tobacco use disorder    recently quit  . Tubular adenoma 08/2013   Dr. Hilarie Fredrickson  . Weak urinary stream     Past Surgical History:  Procedure Laterality Date  . CARDIAC CATHETERIZATION  08/2010   LAD: 80% ISR, RCA: 80% ostial, OM 80-90%  . CAROTID ENDARTERECTOMY  08/2010   left/ Dr. Kellie Simmering  . COLONOSCOPY    . CORONARY ANGIOPLASTY     LAD: before CABG  . CORONARY ARTERY BYPASS GRAFT  09/06/2010   At [facility]: LIMA to LAD, left radial to RCA, sequential SVG to OM3 and 4  . heart stents  Jan 2011   leg stents 06/2009 and 03/2010  . PTA of illiac and SFA  multiple   Dr. Ronalee Belts, s/p revision 11/2012  . SPLENECTOMY      Family History  Problem Relation Age of Onset  . Lung cancer Father 48  . Arthritis Father   . Breast cancer Sister 56  . Alcoholism Sister   . Dementia Mother   . Alzheimer's disease Mother   . Arthritis Mother   . Alcoholism Brother   . Epilepsy Sister   . Diabetes Paternal Grandfather     . Colon polyps Paternal Grandfather 69  . Alcoholism Sister   . Alcoholism Sister   . Alcoholism Brother     Social History   Social History  . Marital status: Married    Spouse name: N/A  . Number of children: 0  . Years of education:  N/A   Occupational History  . 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 Smoking status: Light Tobacco Smoker    Packs/day: 0.75    Years: 40.00    Types: Cigarettes  . Smokeless tobacco: Never Used     Comment: 1 cigarette a day  . Alcohol use No  . Drug use: No  . Sexual activity: No   Other Topics Concern  . Not on file   Social History Narrative   Has living will   Wife is health care POA---brother Ronalee Belts is alternate   Would accept resuscitation --but no prolonged ventilation   No tube feeds if cognitively unaware      Review of Systems Wife's son and family still living with them--3 years already Sleeps okay with the CPAP--but has to go to sleep by 11PM. Up by 5 or 6AM. Uses it every night --all night. Appetite is good Has lost some weight--working more on fitness Wears seat belt Full dentures No skin rash or ulcers    Objective:   Physical Exam  Constitutional: He is oriented to person, place, and time. No distress.  HENT:  Mouth/Throat: Oropharynx is clear and moist. No oropharyngeal exudate.  Neck: No thyromegaly present.  Cardiovascular: Normal rate and regular rhythm.  Exam reveals no gallop.   Feet warm but pulses not palpable Loud blowing systolic murmur--loudest at base but also at apex  Pulmonary/Chest: Effort normal and breath sounds normal. No respiratory distress. He has no wheezes. He has no rales.  Abdominal: Soft. There is no tenderness.  Musculoskeletal: He exhibits no edema or tenderness.  Lymphadenopathy:    He has no cervical adenopathy.  Neurological: He is alert and oriented to person, place, and time.  President--- "Megan Salon [place] [employer]" [phone] [id number] Recall 3/3  Decreased sensation in feet  Skin:  No foot lesions  Psychiatric: He has a normal mood and affect. His behavior is normal.          Assessment & Plan:

## 2017-03-15 NOTE — Assessment & Plan Note (Signed)
See social history 

## 2017-03-15 NOTE — Addendum Note (Signed)
Addended by: Pilar Grammes on: 03/15/2017 11:23 AM   Modules accepted: Orders

## 2017-03-15 NOTE — Assessment & Plan Note (Signed)
I have personally reviewed the Medicare Annual Wellness questionnaire and have noted 1. The patient's medical and social history 2. Their use of alcohol, tobacco or illicit drugs 3. Their current medications and supplements 4. The patient's functional ability including ADL's, fall risks, home safety risks and hearing or visual             impairment. 5. Diet and physical activities 6. Evidence for depression or mood disorders  The patients weight, height, BMI and visual acuity have been recorded in the chart I have made referrals, counseling and provided education to the patient based review of the above and I have provided the pt with a written personalized care plan for preventive services.  I have provided you with a copy of your personalized plan for preventive services. Please take the time to review along with your updated medication list.  Recent colon --not due for many years Will defer PSA at least to next year prevnar today Flu vaccine early October this year Has been exercising more

## 2017-03-15 NOTE — Assessment & Plan Note (Signed)
Has follow up ultrasound soon and cardiology visit

## 2017-03-15 NOTE — Assessment & Plan Note (Signed)
Is on losartan

## 2017-03-15 NOTE — Assessment & Plan Note (Signed)
Lab Results  Component Value Date   HGBA1C 6.2 12/24/2016   Good control Neuropathy, nephropathy

## 2017-03-16 ENCOUNTER — Encounter: Payer: Self-pay | Admitting: Internal Medicine

## 2017-03-16 DIAGNOSIS — D696 Thrombocytopenia, unspecified: Secondary | ICD-10-CM | POA: Insufficient documentation

## 2017-03-19 ENCOUNTER — Other Ambulatory Visit: Payer: Self-pay | Admitting: Internal Medicine

## 2017-03-19 ENCOUNTER — Telehealth: Payer: Self-pay | Admitting: *Deleted

## 2017-03-19 NOTE — Telephone Encounter (Signed)
Left message for patient to notify them that it is time to schedule annual low dose lung cancer screening CT scan. Instructed patient to call back to verify information prior to the scan being scheduled.  

## 2017-03-20 ENCOUNTER — Telehealth: Payer: Self-pay | Admitting: *Deleted

## 2017-03-20 ENCOUNTER — Ambulatory Visit: Payer: Medicare Other

## 2017-03-20 ENCOUNTER — Ambulatory Visit: Payer: Medicare Other | Admitting: Psychology

## 2017-03-20 DIAGNOSIS — I739 Peripheral vascular disease, unspecified: Principal | ICD-10-CM

## 2017-03-20 DIAGNOSIS — Z87891 Personal history of nicotine dependence: Secondary | ICD-10-CM

## 2017-03-20 DIAGNOSIS — Z122 Encounter for screening for malignant neoplasm of respiratory organs: Secondary | ICD-10-CM

## 2017-03-20 DIAGNOSIS — I6523 Occlusion and stenosis of bilateral carotid arteries: Secondary | ICD-10-CM | POA: Diagnosis not present

## 2017-03-20 DIAGNOSIS — I779 Disorder of arteries and arterioles, unspecified: Secondary | ICD-10-CM

## 2017-03-20 LAB — VAS US CAROTID
LEFT ECA DIAS: -17 cm/s
LEFT VERTEBRAL DIAS: -15 cm/s
Left CCA dist dias: 17 cm/s
Left CCA dist sys: 60 cm/s
Left CCA prox dias: 17 cm/s
Left CCA prox sys: 57 cm/s
Left ICA dist dias: -25 cm/s
Left ICA dist sys: -61 cm/s
Left ICA prox dias: -21 cm/s
Left ICA prox sys: -68 cm/s
RIGHT ECA DIAS: 29 cm/s
RIGHT VERTEBRAL DIAS: 40 cm/s
Right CCA prox dias: 12 cm/s
Right CCA prox sys: 47 cm/s
Right cca dist sys: 106 cm/s

## 2017-03-20 NOTE — Telephone Encounter (Signed)
Notified patient that annual lung cancer screening low dose CT scan is due currently or will be in near future. Confirmed that patient is within the age range of 55-77, and asymptomatic, (no signs or symptoms of lung cancer). Patient denies illness that would prevent curative treatment for lung cancer if found. Verified smoking history, (current, 40.25 pack year). The shared decision making visit was done 03/27/16. Patient is agreeable for CT scan being scheduled.

## 2017-03-21 DIAGNOSIS — M0579 Rheumatoid arthritis with rheumatoid factor of multiple sites without organ or systems involvement: Secondary | ICD-10-CM | POA: Diagnosis not present

## 2017-03-22 ENCOUNTER — Telehealth: Payer: Self-pay | Admitting: Internal Medicine

## 2017-03-22 ENCOUNTER — Other Ambulatory Visit: Payer: Self-pay | Admitting: Internal Medicine

## 2017-03-22 ENCOUNTER — Telehealth: Payer: Self-pay | Admitting: Cardiovascular Disease

## 2017-03-22 DIAGNOSIS — E079 Disorder of thyroid, unspecified: Secondary | ICD-10-CM

## 2017-03-22 DIAGNOSIS — I739 Peripheral vascular disease, unspecified: Principal | ICD-10-CM

## 2017-03-22 DIAGNOSIS — I779 Disorder of arteries and arterioles, unspecified: Secondary | ICD-10-CM

## 2017-03-22 DIAGNOSIS — E0789 Other specified disorders of thyroid: Secondary | ICD-10-CM

## 2017-03-22 NOTE — Addendum Note (Signed)
Addended by: Valora Corporal on: 03/22/2017 02:38 PM   Modules accepted: Orders

## 2017-03-22 NOTE — Telephone Encounter (Signed)
Left message asking pt to call the office regarding referral °

## 2017-03-22 NOTE — Telephone Encounter (Signed)
Patient called back for results on carotid US

## 2017-03-22 NOTE — Telephone Encounter (Signed)
Left voicemail message to call back  

## 2017-03-22 NOTE — Telephone Encounter (Addendum)
Spoke with patient and reviewed results and recommendations. He verbalized understanding with no further questions at this time.  

## 2017-03-22 NOTE — Telephone Encounter (Signed)
lmov to schedule fu for Caroltid [place]   Patient had in august 2018  Does not need.  FU next year per results.

## 2017-03-24 ENCOUNTER — Other Ambulatory Visit: Payer: Self-pay | Admitting: Cardiovascular Disease

## 2017-03-26 ENCOUNTER — Ambulatory Visit (INDEPENDENT_AMBULATORY_CARE_PROVIDER_SITE_OTHER): Payer: Medicare Other | Admitting: Cardiovascular Disease

## 2017-03-26 ENCOUNTER — Encounter: Payer: Self-pay | Admitting: Cardiovascular Disease

## 2017-03-26 VITALS — BP 142/82 | HR 73 | Ht 67.0 in | Wt 249.5 lb

## 2017-03-26 DIAGNOSIS — M2550 Pain in unspecified joint: Secondary | ICD-10-CM | POA: Diagnosis not present

## 2017-03-26 DIAGNOSIS — I2510 Atherosclerotic heart disease of native coronary artery without angina pectoris: Secondary | ICD-10-CM | POA: Diagnosis not present

## 2017-03-26 DIAGNOSIS — I350 Nonrheumatic aortic (valve) stenosis: Secondary | ICD-10-CM

## 2017-03-26 DIAGNOSIS — E782 Mixed hyperlipidemia: Secondary | ICD-10-CM

## 2017-03-26 DIAGNOSIS — I6521 Occlusion and stenosis of right carotid artery: Secondary | ICD-10-CM | POA: Diagnosis not present

## 2017-03-26 DIAGNOSIS — M150 Primary generalized (osteo)arthritis: Secondary | ICD-10-CM | POA: Diagnosis not present

## 2017-03-26 DIAGNOSIS — L4050 Arthropathic psoriasis, unspecified: Secondary | ICD-10-CM | POA: Diagnosis not present

## 2017-03-26 DIAGNOSIS — I779 Disorder of arteries and arterioles, unspecified: Secondary | ICD-10-CM | POA: Diagnosis not present

## 2017-03-26 DIAGNOSIS — I10 Essential (primary) hypertension: Secondary | ICD-10-CM | POA: Diagnosis not present

## 2017-03-26 DIAGNOSIS — I739 Peripheral vascular disease, unspecified: Secondary | ICD-10-CM

## 2017-03-26 DIAGNOSIS — Z79899 Other long term (current) drug therapy: Secondary | ICD-10-CM | POA: Diagnosis not present

## 2017-03-26 DIAGNOSIS — M0579 Rheumatoid arthritis with rheumatoid factor of multiple sites without organ or systems involvement: Secondary | ICD-10-CM | POA: Diagnosis not present

## 2017-03-26 NOTE — Patient Instructions (Addendum)
Medication Instructions:  Your physician recommends that you continue on your current medications as directed. Please refer to the Current Medication list given to you today.   Labwork: none  Testing/Procedures: Your physician has requested that you have an echocardiogram in December. Echocardiography is a painless test that uses sound waves to create images of your heart. It provides your doctor with information about the size and shape of your heart and how well your heart's chambers and valves are working. This procedure takes approximately one hour. There are no restrictions for this procedure.    Follow-Up: Your physician wants you to follow-up in: 6 months with Dr. Fletcher Anon.  You will receive a reminder letter in the mail two months in advance. If you don't receive a letter, please call our office to schedule the follow-up appointment.   Any Other Special Instructions Will Be Listed Below (If Applicable).     If you need a refill on your cardiac medications before your next appointment, please call your pharmacy.  Echocardiogram An echocardiogram, or echocardiography, uses sound waves (ultrasound) to produce an image of your heart. The echocardiogram is simple, painless, obtained within a short period of time, and offers valuable information to your health care provider. The images from an echocardiogram can provide information such as:  Evidence of coronary artery disease (CAD).  Heart size.  Heart muscle function.  Heart valve function.  Aneurysm detection.  Evidence of a past heart attack.  Fluid buildup around the heart.  Heart muscle thickening.  Assess heart valve function.  Tell a health care provider about:  Any allergies you have.  All medicines you are taking, including vitamins, herbs, eye drops, creams, and over-the-counter medicines.  Any problems you or family members have had with anesthetic medicines.  Any blood disorders you have.  Any  surgeries you have had.  Any medical conditions you have.  Whether you are pregnant or may be pregnant. What happens before the procedure? No special preparation is needed. Eat and drink normally. What happens during the procedure?  In order to produce an image of your heart, gel will be applied to your chest and a wand-like tool (transducer) will be moved over your chest. The gel will help transmit the sound waves from the transducer. The sound waves will harmlessly bounce off your heart to allow the heart images to be captured in real-time motion. These images will then be recorded.  You may need an IV to receive a medicine that improves the quality of the pictures. What happens after the procedure? You may return to your normal schedule including diet, activities, and medicines, unless your health care provider tells you otherwise. This information is not intended to replace advice given to you by your health care provider. Make sure you discuss any questions you have with your health care provider. Document Released: 07/06/2000 Document Revised: 02/25/2016 Document Reviewed: 03/16/2013 Elsevier Interactive Patient Education  2017 [employer].

## 2017-03-26 NOTE — Progress Notes (Signed)
Cardiology Office Note   Date:  03/26/2017   ID:  [name] [name], DOB 02-26-1956, MRN [id number]  PCP:  [name], MD  Cardiologist:   [name], MD   Chief Complaint  Patient presents with  . other    6 month and carotid follow up. Meds reviewed by the pt. verbally. "doing well."       History of Present Illness: [name] [name] is a 61 y.o. male who presents for a follow-up visit regarding coronary artery disease. He is status post CABG in 2012 for 3 vessel coronary artery disease. He has extensive medical problems that include carotid artery disease status post left carotid endarterectomy , aortic stenosis, tobacco use, obesity, psoriasis, type 2 diabetes, hypertension, hyperlipidemia and peripheral arterial disease which is being followed by Dr. Ronalee Belts. Most recent stress test was done in of 2013 which showed no evidence of ischemia. Echocardiogram in October 2017 showed low normal LV systolic function with an EF of 50-27%, grade 2 diastolic dysfunction, moderate aortic stenosis with mean gradient of 21 mmHg and mild to moderate mitral regurgitation.  He has been doing well and denies any chest pain or worsening dyspnea. No orthopnea, PND or significant leg edema. He had recent carotid Doppler done which showed stable moderate disease. However, there was incidental finding of abnormal thyroid structure on the right side. He is scheduled for a dedicated thyroid ultrasound this week.  He continues to smoke 2 cigarettes a day.  Past Medical History:  Diagnosis Date  . Adenomatous colon polyp   . Allergy   . Angina at rest [facility])    chronic  . Anxiety   . Arthritis   . Bell's palsy 2007  . Carotid artery occlusion   . Cataract    Dr. Dawna Part  . Coronary artery disease   . Depression   . Diabetes mellitus   . Diverticulosis   . Duodenitis   . DVT (deep venous thrombosis) (HCC)    in leg  . Gastropathy   . Heart murmur   . Helicobacter pylori gastritis     . Hyperlipidemia   . Hypertension   . Mild aortic stenosis   . Neuropathy   . Obesity   . Peripheral vascular disease ([place])   . Post splenectomy syndrome   . Psoriasis   . Psoriatic arthritis ([place])   . Sleep apnea   . SOB (shortness of breath)   . Stroke ([place])   . Tobacco use disorder    recently quit  . Tubular adenoma 08/2013   Dr. Hilarie Fredrickson  . Weak urinary stream     Past Surgical History:  Procedure Laterality Date  . CARDIAC CATHETERIZATION  08/2010   LAD: 80% ISR, RCA: 80% ostial, OM 80-90%  . CAROTID ENDARTERECTOMY  08/2010   left/ Dr. Kellie Simmering  . COLONOSCOPY    . CORONARY ANGIOPLASTY     LAD: before CABG  . CORONARY ARTERY BYPASS GRAFT  09/06/2010   At [facility]: LIMA to LAD, left radial to RCA, sequential SVG to OM3 and 4  . heart stents  Jan 2011   leg stents 06/2009 and 03/2010  . PTA of illiac and SFA  multiple   Dr. Ronalee Belts, s/p revision 11/2012  . SPLENECTOMY       Current Outpatient Prescriptions  Medication Sig Dispense Refill  . ACCU-CHEK FASTCLIX LANCETS MISC Use to check sugar 6 times daily. 600 each 5  . albuterol (PROAIR HFA) 108 (90 Base) MCG/ACT inhaler Inhale  2 puffs into the lungs every 6 (six) hours as needed for shortness of breath.     [name] aspirin 81 MG tablet Take 81 mg by mouth daily.    [name] BAYER CONTOUR TEST test strip TEST THREE TIMES A DAY AS DIRECTED 300 each 0  . buPROPion (WELLBUTRIN XL) 300 MG 24 hr tablet Take 1 tablet (300 mg total) by mouth daily. 30 tablet 2  . busPIRone (BUSPAR) 10 MG tablet Take 2 tablets (20 mg total) by mouth 2 (two) times daily. 120 tablet 4  . carvedilol (COREG) 12.5 MG tablet TAKE 1 TABLET BY MOUTH TWICE DAILY 60 tablet 2  . Cholecalciferol (VITAMIN D PO) Take by mouth.    . clopidogrel (PLAVIX) 75 MG tablet Take 1 tablet (75 mg total) by mouth daily. 90 tablet 3  . cyclobenzaprine (FLEXERIL) 10 MG tablet take 1 tablet by mouth three times a day if needed for muscle spasm 30 tablet 0  . insulin NPH Human (NOVOLIN N  RELION) 100 UNIT/ML injection Inject 40 units in am and 20 units in the evening 40 mL 2  . insulin regular (NOVOLIN R,HUMULIN R) 250 units/2.37mL (100 units/mL) injection Inject 0.1-0.3 mLs (10-30 Units total) into the skin 3 (three) times daily before meals. 40 mL 5  . ketoconazole (NIZORAL) 2 % cream apply to affected area AS NEEDED FOR IRRITATION  0  . losartan (COZAAR) 25 MG tablet take 1 tablet by mouth once daily 90 tablet 3  . MAGNESIUM PO Take by mouth.    . mupirocin cream (BACTROBAN) 2 % Apply 1 application topically 2 (two) times daily. 15 g 0  . nitroGLYCERIN (NITROSTAT) 0.4 MG SL tablet Place 1 tablet (0.4 mg total) under the tongue every 5 (five) minutes as needed. 25 tablet 1  . NOVOLIN N RELION 100 UNIT/ML injection INJECT 40 UNITS IN THE MORNING AND 20 UNITS IN THE EVENING 40 mL 2  . omeprazole (PRILOSEC) 20 MG capsule Take 1 capsule (20 mg total) by mouth 2 (two) times daily. (Patient taking differently: Take 20 mg by mouth 2 (two) times daily as needed (HEARTBURN). ) 20 capsule 0  . sertraline (ZOLOFT) 100 MG tablet Take 1.5 tablets (150 mg total) by mouth daily. 45 tablet 2  . simvastatin (ZOCOR) 20 MG tablet take 1 tablet by mouth once daily AT 6PM 90 tablet 3  . tamsulosin (FLOMAX) 0.4 MG CAPS capsule take 2 capsule by mouth daily 60 capsule 11  . topiramate (TOPAMAX) 100 MG tablet Take 1 tablet (100 mg total) by mouth 2 (two) times daily. 60 tablet 5  . triamcinolone ointment (KENALOG) 0.1 % apply to affected area twice a day AVOID FACE, GROIN AND UNDERARMS  0   No current facility-administered medications for this visit.     Allergies:   Contrast media [iodinated diagnostic agents]; Glipizide; Metrizamide; and Penicillins    Social History:  The patient  reports that he has been smoking Cigarettes.  He has a 30.00 pack-year smoking history. He has never used smokeless tobacco. He reports that he does not drink alcohol or use drugs.   Family History:  The patient's family  history includes Alcoholism in his brother, brother, sister, sister, and sister; Alzheimer's disease in his mother; Arthritis in his father and mother; Breast cancer (age of onset: 48) in his sister; Colon polyps (age of onset: 28) in his paternal grandfather; Dementia in his mother; Diabetes in his paternal grandfather; Epilepsy in his sister; Lung cancer (age of onset:  30) in his father.    ROS:  Please see the history of present illness.   Otherwise, review of systems are positive for none.   All other systems are reviewed and negative.    PHYSICAL EXAM: VS:  BP (!) 142/82 (BP Location: Right Arm, Patient Position: Sitting, Cuff Size: Normal)   Pulse 73   Ht 5\' 7"  (1.702 m)   Wt 249 lb 8 oz (113.2 kg)   BMI 39.08 kg/m  , BMI Body mass index is 39.08 kg/m. GEN: Well nourished, well developed, in no acute distress  HEENT: normal  Neck: no JVD, or masses. Bilateral carotid bruits Cardiac: RRR; no rubs, or gallops,no edema . 3/6 crescendo decrescendo systolic murmur in the aortic area which is mid peaking with diminished S2 Respiratory:  clear to auscultation bilaterally, normal work of breathing GI: soft, nontender, nondistended, + BS MS: no deformity or atrophy  Skin: warm and dry, no rash Neuro:  Strength and sensation are intact Psych: euthymic mood, full affect   EKG:  EKG is ordered today. EKG showed normal sinus rhythm with left anterior fascicular block. Nonspecific T wave changes    Recent Labs: 03/15/2017: ALT 10; BUN 29; Creatinine, Ser 2.09; Hemoglobin 13.5; Platelets 139.0; Potassium 4.4; Sodium 140    Lipid Panel    Component Value Date/Time   CHOL 132 03/15/2017 1021   TRIG 264.0 (H) 03/15/2017 1021   HDL 22.30 (L) 03/15/2017 1021   CHOLHDL 6 03/15/2017 1021   VLDL 52.8 (H) 03/15/2017 1021   LDLCALC 55 12/13/2015 0846   LDLDIRECT 61.0 03/15/2017 1021      Wt Readings from Last 3 Encounters:  03/26/17 249 lb 8 oz (113.2 kg)  03/15/17 246 lb (111.6 kg)    12/31/16 256 lb (116.1 kg)       No flowsheet data found.    ASSESSMENT AND PLAN:  1.  Coronary artery disease involving native coronary arteries without angina: He is overall stable from a cardiac standpoint with no worsening symptoms. Continue medical therapy. He is on long-term dual antiplatelet therapy due to extensive generalized atherosclerosis (CAD, PAD and carotid disease).   2. Moderate aortic stenosis:  I requested a repeat echocardiogram to be done in December of this year.   3. Bilateral carotid disease status post left carotid endarterectomy.   Recent carotid Doppler showed moderate right carotid stenosis. Repeat study in one year  4. Essential hypertension:   Blood pressure is reasonably controlled on current medications.  5. Hyperlipidemia: Most recent LDL was 54 on current dose on simvastatin.    Disposition:   FU with me in 6 months  Signed,  [name], MD  03/26/2017 2:19 PM    [facility]

## 2017-03-27 ENCOUNTER — Ambulatory Visit (INDEPENDENT_AMBULATORY_CARE_PROVIDER_SITE_OTHER): Payer: Medicare Other | Admitting: Psychology

## 2017-03-27 DIAGNOSIS — F4312 Post-traumatic stress disorder, chronic: Secondary | ICD-10-CM | POA: Diagnosis not present

## 2017-03-29 ENCOUNTER — Ambulatory Visit
Admission: RE | Admit: 2017-03-29 | Discharge: 2017-03-29 | Disposition: A | Discharge status: Home / Self Care | Payer: Medicare Other | Source: Ambulatory Visit | Attending: Internal Medicine | Admitting: Internal Medicine

## 2017-03-29 ENCOUNTER — Telehealth: Payer: Self-pay | Admitting: Internal Medicine

## 2017-03-29 DIAGNOSIS — E0789 Other specified disorders of thyroid: Secondary | ICD-10-CM

## 2017-03-29 DIAGNOSIS — E042 Nontoxic multinodular goiter: Secondary | ICD-10-CM | POA: Diagnosis not present

## 2017-03-29 DIAGNOSIS — E041 Nontoxic single thyroid nodule: Secondary | ICD-10-CM

## 2017-03-29 DIAGNOSIS — E079 Disorder of thyroid, unspecified: Secondary | ICD-10-CM

## 2017-03-29 NOTE — Telephone Encounter (Signed)
Patient just had MRI done today.  Patient said he's going out of town and he asked to be called back with the results on his cell phone.

## 2017-03-30 ENCOUNTER — Other Ambulatory Visit: Payer: Self-pay | Admitting: Internal Medicine

## 2017-03-30 DIAGNOSIS — E041 Nontoxic single thyroid nodule: Secondary | ICD-10-CM

## 2017-03-30 NOTE — Telephone Encounter (Signed)
Message left Has 1 nodule that needs aspiration Please call him on Monday to find out if he has a preference about where to go (if Dr Jamal Collin hasn't retired yet, that is best choice---you can check on this with [facility]) No rush

## 2017-04-01 NOTE — Telephone Encounter (Signed)
Lm on pts vm requesting a call back 

## 2017-04-02 ENCOUNTER — Ambulatory Visit: Payer: Self-pay | Admitting: Internal Medicine

## 2017-04-03 ENCOUNTER — Ambulatory Visit: Payer: Self-pay | Admitting: Psychology

## 2017-04-03 NOTE — Telephone Encounter (Signed)
Spoke to pt. He said he would like the [employer] office whichever provider he can get in with the quickest.

## 2017-04-08 ENCOUNTER — Ambulatory Visit: Payer: Self-pay | Admitting: Internal Medicine

## 2017-04-09 ENCOUNTER — Ambulatory Visit
Admission: RE | Admit: 2017-04-09 | Discharge: 2017-04-09 | Disposition: A | Discharge status: Home / Self Care | Payer: Medicare Other | Source: Ambulatory Visit | Attending: Oncology | Admitting: Oncology

## 2017-04-09 DIAGNOSIS — J439 Emphysema, unspecified: Secondary | ICD-10-CM | POA: Insufficient documentation

## 2017-04-09 DIAGNOSIS — Z122 Encounter for screening for malignant neoplasm of respiratory organs: Secondary | ICD-10-CM | POA: Insufficient documentation

## 2017-04-09 DIAGNOSIS — Z87891 Personal history of nicotine dependence: Secondary | ICD-10-CM | POA: Insufficient documentation

## 2017-04-09 DIAGNOSIS — I700 Atherosclerosis of aorta: Secondary | ICD-10-CM | POA: Insufficient documentation

## 2017-04-09 DIAGNOSIS — F17210 Nicotine dependence, cigarettes, uncomplicated: Secondary | ICD-10-CM | POA: Diagnosis not present

## 2017-04-10 ENCOUNTER — Ambulatory Visit (INDEPENDENT_AMBULATORY_CARE_PROVIDER_SITE_OTHER): Payer: Medicare Other | Admitting: Psychology

## 2017-04-10 DIAGNOSIS — F4312 Post-traumatic stress disorder, chronic: Secondary | ICD-10-CM

## 2017-04-11 ENCOUNTER — Encounter: Payer: Self-pay | Admitting: General Surgery

## 2017-04-11 ENCOUNTER — Ambulatory Visit (INDEPENDENT_AMBULATORY_CARE_PROVIDER_SITE_OTHER): Payer: Medicare Other | Admitting: General Surgery

## 2017-04-11 VITALS — BP 108/76 | HR 62 | Resp 16 | Ht 66.5 in | Wt 244.0 lb

## 2017-04-11 DIAGNOSIS — I6521 Occlusion and stenosis of right carotid artery: Secondary | ICD-10-CM | POA: Diagnosis not present

## 2017-04-11 DIAGNOSIS — E041 Nontoxic single thyroid nodule: Secondary | ICD-10-CM | POA: Diagnosis not present

## 2017-04-11 NOTE — Progress Notes (Signed)
Patient ID: [name] [name], male   DOB: 1956-03-20, 61 y.o.   MRN: [id number]  Chief Complaint  Patient presents with  . Thyroid Nodule    HPI [name] [name] is a 61 y.o. male.  Here today for evaluation of a thyroid nodule referred by Dr [name] [name]. He states Dr [name] [name] noticed an enlarged nodule during a carotid exam. He does notice trouble swallowing and "food getting stuck" for several years. He admits to dry mouth for over 10 years. Thyroid ultrasound was 03-29-17. He is currently on Plavix, history of quadruple bypass surgery.   HPI  Past Medical History:  Diagnosis Date  . Adenomatous colon polyp   . Allergy   . Angina at rest [facility])    chronic  . Anxiety   . Arthritis   . Bell's palsy 2007  . Carotid artery occlusion   . Cataract    Dr. Dawna [name]  . Coronary artery disease   . Depression   . Diabetes mellitus   . Diverticulosis   . Duodenitis   . DVT (deep venous thrombosis) (HCC)    in leg  . Gastropathy   . Heart murmur   . Helicobacter pylori gastritis   . Hyperlipidemia   . Hypertension   . Mild aortic stenosis   . Neuropathy   . Obesity   . Peripheral vascular disease ([place])   . Post splenectomy syndrome   . Psoriasis   . Psoriatic arthritis ([place])   . Sleep apnea   . SOB (shortness of breath)   . Stroke ([place])   . Tobacco use disorder    recently quit  . Tubular adenoma 08/2013   Dr. Hilarie [name]  . Weak urinary stream     Past Surgical History:  Procedure Laterality Date  . CARDIAC CATHETERIZATION  08/2010   LAD: 80% ISR, RCA: 80% ostial, OM 80-90%  . CAROTID ENDARTERECTOMY  08/2010   left/ Dr. Kellie Simmering  . COLONOSCOPY    . CORONARY ANGIOPLASTY     LAD: before CABG  . CORONARY ARTERY BYPASS GRAFT  09/06/2010   At [facility]: LIMA to LAD, left radial to RCA, sequential SVG to OM3 and 4  . heart stents  Jan 2011   leg stents 06/2009 and 03/2010  . PTA of illiac and SFA  multiple   Dr. Ronalee Belts, s/p revision 11/2012  . SPLENECTOMY      Family History  Problem  Relation Age of Onset  . Lung cancer Father 77  . Arthritis Father   . Breast cancer Sister 11  . Alcoholism Sister   . Dementia Mother   . Alzheimer's disease Mother   . Arthritis Mother   . Alcoholism Brother   . Epilepsy Sister   . Diabetes Paternal Grandfather   . Colon polyps Paternal Grandfather 31  . Alcoholism Sister   . Alcoholism Sister   . Alcoholism Brother     Social History Social History  Substance Use Topics  . Smoking status: Light Tobacco Smoker    Packs/day: 0.75    Years: 40.00    Types: Cigarettes  . Smokeless tobacco: Never Used     Comment: 1 cigarette a day  . Alcohol use No    Allergies  Allergen Reactions  . Contrast Media [Iodinated Diagnostic Agents] Rash and Other (See Comments)    Got very hot and red   . Glipizide Other (See Comments)    ANTIDIABETICS. Burning  . Metrizamide Other (See Comments)    Got very hot and red  .  Penicillins Hives and Swelling    Current Outpatient Prescriptions  Medication Sig Dispense Refill  . ACCU-CHEK FASTCLIX LANCETS MISC Use to check sugar 6 times daily. 600 each 5  . albuterol (PROAIR HFA) 108 (90 Base) MCG/ACT inhaler Inhale 2 puffs into the lungs every 6 (six) hours as needed for shortness of breath.     [name] aspirin 81 MG tablet Take 81 mg by mouth daily.    [name] BAYER CONTOUR TEST test strip TEST THREE TIMES A DAY AS DIRECTED 300 each 0  . buPROPion (WELLBUTRIN XL) 300 MG 24 hr tablet Take 1 tablet (300 mg total) by mouth daily. 30 tablet 2  . busPIRone (BUSPAR) 10 MG tablet Take 2 tablets (20 mg total) by mouth 2 (two) times daily. 120 tablet 4  . carvedilol (COREG) 12.5 MG tablet TAKE 1 TABLET BY MOUTH TWICE DAILY 60 tablet 2  . Cholecalciferol (VITAMIN D PO) Take by mouth.    . clopidogrel (PLAVIX) 75 MG tablet Take 1 tablet (75 mg total) by mouth daily. 90 tablet 3  . cyclobenzaprine (FLEXERIL) 10 MG tablet take 1 tablet by mouth three times a day if needed for muscle spasm 30 tablet 0  . insulin  NPH Human (NOVOLIN N RELION) 100 UNIT/ML injection Inject 40 units in am and 20 units in the evening 40 mL 2  . insulin regular (NOVOLIN R,HUMULIN R) 250 units/2.68mL (100 units/mL) injection Inject 0.1-0.3 mLs (10-30 Units total) into the skin 3 (three) times daily before meals. 40 mL 5  . ketoconazole (NIZORAL) 2 % cream apply to affected area AS NEEDED FOR IRRITATION  0  . losartan (COZAAR) 25 MG tablet take 1 tablet by mouth once daily 90 tablet 3  . MAGNESIUM PO Take by mouth.    . mupirocin cream (BACTROBAN) 2 % Apply 1 application topically 2 (two) times daily. 15 g 0  . nitroGLYCERIN (NITROSTAT) 0.4 MG SL tablet Place 1 tablet (0.4 mg total) under the tongue every 5 (five) minutes as needed. 25 tablet 1  . NOVOLIN N RELION 100 UNIT/ML injection INJECT 40 UNITS IN THE MORNING AND 20 UNITS IN THE EVENING 40 mL 2  . omeprazole (PRILOSEC) 20 MG capsule Take 1 capsule (20 mg total) by mouth 2 (two) times daily. (Patient taking differently: Take 20 mg by mouth 2 (two) times daily as needed (HEARTBURN). ) 20 capsule 0  . sertraline (ZOLOFT) 100 MG tablet Take 1.5 tablets (150 mg total) by mouth daily. 45 tablet 2  . simvastatin (ZOCOR) 20 MG tablet take 1 tablet by mouth once daily AT 6PM 90 tablet 3  . tamsulosin (FLOMAX) 0.4 MG CAPS capsule take 2 capsule by mouth daily 60 capsule 11  . topiramate (TOPAMAX) 100 MG tablet Take 1 tablet (100 mg total) by mouth 2 (two) times daily. 60 tablet 5  . triamcinolone ointment (KENALOG) 0.1 % apply to affected area twice a day AVOID FACE, GROIN AND UNDERARMS  0   No current facility-administered medications for this visit.     Review of Systems Review of Systems  Constitutional: Negative.   Respiratory: Negative.   Cardiovascular: Negative.   Neurological: Positive for headaches.    Blood pressure 108/76, pulse 62, resp. rate 16, height 5' 6.5" (1.689 m), weight 244 lb (110.7 kg).  Physical Exam Physical Exam  Constitutional: He is oriented to  person, place, and time. He appears well-developed and well-nourished.  Eyes: Conjunctivae are normal. No scleral icterus.  Neck: Neck supple. No thyromegaly  present.  Cardiovascular: Normal rate, regular rhythm and normal heart sounds.   Pulmonary/Chest: Effort normal and breath sounds normal. No respiratory distress.  Abdominal: Soft. Bowel sounds are normal. There is no tenderness.  Lymphadenopathy:    He has no cervical adenopathy.  Neurological: He is alert and oriented to person, place, and time.  Skin: Skin is warm and dry.  Psychiatric: He has a normal mood and affect. His behavior is normal.    Data Reviewed US thyroid reviewed  Assessment    Thyroid nodule - US showed a 1.5 cm x 1.2 x 1.3 cm nodule in right superior lobe and was recommended for FNA. Not palpable on physical exam today.     Plan    Draw T4 and TSH today Schedule for FNA of right thyroid nodule. Patient will need to be off of Plavix for 5 days prior to procedure.     HPI, Physical Exam, Assessment and Plan have been scribed under the direction and in the presence of Mckinley Jewel, MD Karie Fetch, RN  I have completed the exam and reviewed the above documentation for accuracy and completeness.  I agree with the above.  Haematologist has been used and any errors in dictation or transcription are unintentional.  Seeplaputhur G. Jamal Collin, M.D., F.A.C.S.   Junie Panning G 04/11/2017, 11:49 AM

## 2017-04-11 NOTE — Patient Instructions (Signed)
Patient will need to be off of Plavix for 5 days prior to procedure.

## 2017-04-12 ENCOUNTER — Ambulatory Visit (INDEPENDENT_AMBULATORY_CARE_PROVIDER_SITE_OTHER): Payer: Medicare Other | Admitting: Internal Medicine

## 2017-04-12 ENCOUNTER — Encounter: Payer: Self-pay | Admitting: Internal Medicine

## 2017-04-12 VITALS — BP 134/84 | HR 71 | Wt 242.0 lb

## 2017-04-12 DIAGNOSIS — E1151 Type 2 diabetes mellitus with diabetic peripheral angiopathy without gangrene: Secondary | ICD-10-CM

## 2017-04-12 DIAGNOSIS — I6521 Occlusion and stenosis of right carotid artery: Secondary | ICD-10-CM

## 2017-04-12 DIAGNOSIS — E041 Nontoxic single thyroid nodule: Secondary | ICD-10-CM | POA: Diagnosis not present

## 2017-04-12 DIAGNOSIS — Z794 Long term (current) use of insulin: Secondary | ICD-10-CM | POA: Diagnosis not present

## 2017-04-12 LAB — T4 AND TSH
T4, Total: 4.8 ug/dL (ref 4.5–12.0)
TSH: 2.17 u[IU]/mL (ref 0.450–4.500)

## 2017-04-12 LAB — POCT GLYCOSYLATED HEMOGLOBIN (HGB A1C): Hemoglobin A1C: 5.8

## 2017-04-12 NOTE — Progress Notes (Signed)
Patient ID: [name] [name], male   DOB: [birth year], 61 y.o.   MRN: [id number]  HPI: [name] [name] is a 61 y.o.-year-old male, returning for f/u for DM2, dx 2006, insulin-dependent since 2011, uncontrolled, with complications (CAD - CABG 2012, PVD - s/p Stents, Aortic and carotid stenosis, peripheral neuropathy). Last visit 3.5 mo ago.  Since last visit, he was dx'ed with  Thyroid nodule: 0.9 x 0.7 x 1.3 cm >> solid, hypoechoic. He was recommended Bx. He also saw a surgeon yesterday.  Has chronic L foot pain and also now bruising. He will see his podiatrist in 2 days.  He lost 9 more lbs since last visit since changing his diet. His sugars improved to the point of almost stopping his insulin.  Last hemoglobin A1c: Lab Results  Component Value Date   HGBA1C 6.2 12/24/2016   HGBA1C 5.9 06/11/2016   HGBA1C 6.2 03/06/2016   He is on: N and R insulins: Previously: Insulin Before breakfast Before lunch Before dinner  Regular - clear (short acting) 20 units - smaller meal 25 units - regular meal 30 units - larger meal  10 units - smaller meal 15 units - regular meal 20 units - larger meal  NPH - cloudy (long acting) 40  20   Now only on 1 inj a week: N:10 units + R:10 units before a meal.  We stopped Victoza at a previous visit. He could not afford his insulins (doughnut hole) - 400$.   Pt checks his sugars 3x a day - reviewed his log: - am: 80-139 >> 81-131 >> 92, 102-147, 177 >> 83-148, 158, 162 >> 87-142 - 2h after b'fast: 137-162, 198 >> 145-158 >> 173 >> n/c >> 148-178 - before lunch: 152-180, 226 >> n/c >> 132-190, 220 >> n/c >> 165 - 2h after lunch:  82-167, 215 >> 85-177 >> 75-147, 191 >> 91-155, 185 >> 114-165 - before dinner: 69-137 >> 175 >> n/c >> n/c >> 164 >> 192 >> 141 - 2h after dinner: 94-201, 225 >> 110-147, 226 >> 112-189, 200 >> 217 >> 92-190, 241 - bedtime: 109-139 >> n/c >> 130, 169 >> n/c Lowest sugar was 83 >> 87; ? hypoglycemia awareness. Highest sugar  was 226 >> 217 >> 241.  Pt's meals are: - Breakfast: toast + eggs or cereals + milk - Lunch: soup + 1/2 sandwich - Dinner: meat + sweet potatoes or pasta + salad - Snacks: 3: sugar free foods, fruit He quit drinking sodas, + tea- artificial sweetener. + Almond milk.  - + CKD, last BUN/creatinine:  Lab Results  Component Value Date   BUN 29 (H) 03/15/2017   CREATININE 2.09 (H) 03/15/2017  On Cozaar  Sees nephrology. Has a stone in L kidney, has 3 cysts in R kidney. - last set of lipids: Lab Results  Component Value Date   CHOL 132 03/15/2017   HDL 22.30 (L) 03/15/2017   LDLCALC 55 12/13/2015   LDLDIRECT 61.0 03/15/2017   TRIG 264.0 (H) 03/15/2017   CHOLHDL 6 03/15/2017  On Zocor - last eye exam was in 05/2016 >> No DR- [facility].+ cataracts - + numbness and tingling in his feet - [facility]). Dr. Elvina Mattes.  He had surgery for PVD stents on 04/27/2014. His sugars improved after the stent.   ROS: Constitutional: + weight loss, no fatigue, no subjective hyperthermia,+ subjective hypothermia, + nocturia Eyes: no blurry vision, no xerophthalmia ENT: no sore throat, no nodules palpated in throat, + dysphagia, no odynophagia,  no hoarseness Cardiovascular: no CP/no SOB/no palpitations/no leg swelling Respiratory: + cough/no SOB/no wheezing Gastrointestinal: no N/no V/no D/no C/no acid reflux Musculoskeletal: no muscle aches/no joint aches Skin: no rashes, no hair loss Neurological: no tremors/+ numbness/+ tingling/no dizziness, + HA  I reviewed pt's medications, allergies, PMH, social hx, family hx, and changes were documented in the history of present illness. Otherwise, unchanged from my initial visit note.  PE: BP 134/84 (BP Location: Left Arm, Patient Position: Sitting)   Pulse 71   Wt 242 lb (109.8 kg)   SpO2 98%   BMI 38.47 kg/m  Body mass index is 38.47 kg/m. Wt Readings from Last 3 Encounters:  04/12/17 242 lb (109.8 kg)  04/11/17 244 lb (110.7  kg)  04/09/17 238 lb (108 kg)   Constitutional: obese, in NAD Eyes: PERRLA, EOMI, no exophthalmos ENT: moist mucous membranes, no thyromegaly, no cervical lymphadenopathy Cardiovascular: RRR, No RG, + 3/6 SEM Respiratory: CTA B Gastrointestinal: abdomen soft, NT, ND, BS+ Musculoskeletal: no deformities, strength intact in all 4 Skin: moist, warm, no rashes except rosacea on face Neurological: no tremor with outstretched hands, DTR normal in all 4  ASSESSMENT: 1. DM2, insulin-dependent, now controlled, with complications - CAD - s/p CABG 2012 - PVD - s/p stents 2012 - Aortic stenosis - carotid stenosis - CKD - peripheral neuropathy  - He read Dr Janene Harvey book: Program for Reversing Diabetes".  2. R Thyroid nodule  PLAN:  1. Patient with long-standing, with significantly improved diabetes control after he started to change his diet towards a more plant-based one. His sugars improved so dramatically that he is now only taking occasional insulin doses, maybe once a week. At that time, he is taking 10 units of N and 10 units of R insulin. - He also continues to lose weight, he lost approximately 9 pounds since last visit  - I strongly advised him to continue the diet - I advised him to Patient Instructions  Please continue: N 10 units and R 10 units as needed.  Please come back for a follow-up appointment in 4 months.  - today, HbA1c is 5.8% (wonderful!) - continue checking sugars at different times of the day - check 1-2x a day, rotating checks - advised for yearly eye exams >> he is UTD - Return to clinic in 4 mo with sugar log   2. R Thyroid nodule - new dx - the nodule is not very large, but hypoechoic and solid - he will have a Bx tomorrow - advised that if not cancerous, we can follow this together, no surgery needed especially since no neck compression sxs. He has slight dysphagia, not new  Philemon Kingdom, MD PhD [facility]

## 2017-04-12 NOTE — Patient Instructions (Addendum)
Please continue: N 10 units and R 10 units as needed.  Please come back for a follow-up appointment in 4 months.

## 2017-04-12 NOTE — Addendum Note (Signed)
Addended by: Caprice Beaver T on: 04/12/2017 10:31 AM   Modules accepted: Orders

## 2017-04-15 ENCOUNTER — Encounter: Payer: Self-pay | Admitting: *Deleted

## 2017-04-15 ENCOUNTER — Other Ambulatory Visit (INDEPENDENT_AMBULATORY_CARE_PROVIDER_SITE_OTHER): Payer: Self-pay | Admitting: Vascular Surgery

## 2017-04-15 DIAGNOSIS — I70219 Atherosclerosis of native arteries of extremities with intermittent claudication, unspecified extremity: Secondary | ICD-10-CM

## 2017-04-16 ENCOUNTER — Ambulatory Visit (INDEPENDENT_AMBULATORY_CARE_PROVIDER_SITE_OTHER): Payer: Medicare Other | Admitting: Vascular Surgery

## 2017-04-16 ENCOUNTER — Other Ambulatory Visit (INDEPENDENT_AMBULATORY_CARE_PROVIDER_SITE_OTHER): Payer: Medicare Other

## 2017-04-16 ENCOUNTER — Encounter (INDEPENDENT_AMBULATORY_CARE_PROVIDER_SITE_OTHER): Payer: Self-pay | Admitting: Vascular Surgery

## 2017-04-16 ENCOUNTER — Ambulatory Visit (INDEPENDENT_AMBULATORY_CARE_PROVIDER_SITE_OTHER): Payer: Medicare Other

## 2017-04-16 ENCOUNTER — Other Ambulatory Visit (INDEPENDENT_AMBULATORY_CARE_PROVIDER_SITE_OTHER): Payer: Self-pay | Admitting: Vascular Surgery

## 2017-04-16 VITALS — BP 148/79 | HR 75 | Resp 16 | Ht 66.5 in | Wt 245.0 lb

## 2017-04-16 DIAGNOSIS — I70219 Atherosclerosis of native arteries of extremities with intermittent claudication, unspecified extremity: Secondary | ICD-10-CM

## 2017-04-16 DIAGNOSIS — Z794 Long term (current) use of insulin: Secondary | ICD-10-CM | POA: Diagnosis not present

## 2017-04-16 DIAGNOSIS — F17210 Nicotine dependence, cigarettes, uncomplicated: Secondary | ICD-10-CM

## 2017-04-16 DIAGNOSIS — I6521 Occlusion and stenosis of right carotid artery: Secondary | ICD-10-CM | POA: Diagnosis not present

## 2017-04-16 DIAGNOSIS — I739 Peripheral vascular disease, unspecified: Secondary | ICD-10-CM | POA: Diagnosis not present

## 2017-04-16 DIAGNOSIS — I10 Essential (primary) hypertension: Secondary | ICD-10-CM | POA: Diagnosis not present

## 2017-04-16 DIAGNOSIS — E1151 Type 2 diabetes mellitus with diabetic peripheral angiopathy without gangrene: Secondary | ICD-10-CM | POA: Diagnosis not present

## 2017-04-16 DIAGNOSIS — Z720 Tobacco use: Secondary | ICD-10-CM | POA: Diagnosis not present

## 2017-04-16 NOTE — Assessment & Plan Note (Signed)

## 2017-04-16 NOTE — Progress Notes (Signed)
MRN : [id number]  [name] [name] is a 61 y.o. ([birth year]) male who presents with chief complaint of  Chief Complaint  Patient presents with  . Follow-up    71yr abi,ble art  .  History of Present Illness: Patient returns today in follow up of PAD. He has been having some pain in his left foot as well as discoloration of the left foot. He does not describe lifestyle limiting claudication at this time. His ABIs today are stable on the right at 1.01 but have dropped on the left now down to 0.84. Duplex shows 50-99% stenosis in the left SFA stent as well as 50-74% stenosis in the left deep femoral artery. He is planning on quitting smoking and his sugar control has improved.    Current Outpatient Prescriptions  Medication Sig Dispense Refill  . ACCU-CHEK FASTCLIX LANCETS MISC Use to check sugar 6 times daily. 600 each 5  . albuterol (PROAIR HFA) 108 (90 Base) MCG/ACT inhaler Inhale 2 puffs into the lungs every 6 (six) hours as needed for shortness of breath.     [name] aspirin 81 MG tablet Take 81 mg by mouth daily.    [name] BAYER CONTOUR TEST test strip TEST THREE TIMES A DAY AS DIRECTED 300 each 0  . buPROPion (WELLBUTRIN XL) 300 MG 24 hr tablet Take 1 tablet (300 mg total) by mouth daily. 30 tablet 2  . busPIRone (BUSPAR) 10 MG tablet Take 2 tablets (20 mg total) by mouth 2 (two) times daily. 120 tablet 4  . carvedilol (COREG) 12.5 MG tablet TAKE 1 TABLET BY MOUTH TWICE DAILY 60 tablet 2  . Cholecalciferol (VITAMIN D PO) Take by mouth.    . clopidogrel (PLAVIX) 75 MG tablet Take 1 tablet (75 mg total) by mouth daily. 90 tablet 3  . cyclobenzaprine (FLEXERIL) 10 MG tablet take 1 tablet by mouth three times a day if needed for muscle spasm 30 tablet 0  . insulin NPH Human (NOVOLIN N RELION) 100 UNIT/ML injection Inject 40 units in am and 20 units in the evening 40 mL 2  . insulin regular (NOVOLIN R,HUMULIN R) 250 units/2.64mL (100 units/mL) injection Inject 0.1-0.3 mLs (10-30 Units total) into the  skin 3 (three) times daily before meals. 40 mL 5  . ketoconazole (NIZORAL) 2 % cream apply to affected area AS NEEDED FOR IRRITATION  0  . losartan (COZAAR) 25 MG tablet take 1 tablet by mouth once daily 90 tablet 3  . MAGNESIUM PO Take by mouth.    . mupirocin cream (BACTROBAN) 2 % Apply 1 application topically 2 (two) times daily. 15 g 0  . nitroGLYCERIN (NITROSTAT) 0.4 MG SL tablet Place 1 tablet (0.4 mg total) under the tongue every 5 (five) minutes as needed. 25 tablet 1  . NOVOLIN N RELION 100 UNIT/ML injection INJECT 40 UNITS IN THE MORNING AND 20 UNITS IN THE EVENING 40 mL 2  . omeprazole (PRILOSEC) 20 MG capsule Take 1 capsule (20 mg total) by mouth 2 (two) times daily. (Patient taking differently: Take 20 mg by mouth 2 (two) times daily as needed (HEARTBURN). ) 20 capsule 0  . sertraline (ZOLOFT) 100 MG tablet Take 1.5 tablets (150 mg total) by mouth daily. 45 tablet 2  . simvastatin (ZOCOR) 20 MG tablet take 1 tablet by mouth once daily AT 6PM 90 tablet 3  . tamsulosin (FLOMAX) 0.4 MG CAPS capsule take 2 capsule by mouth daily 60 capsule 11  . topiramate (TOPAMAX) 100 MG tablet  Take 1 tablet (100 mg total) by mouth 2 (two) times daily. 60 tablet 5  . triamcinolone ointment (KENALOG) 0.1 % apply to affected area twice a day AVOID FACE, GROIN AND UNDERARMS  0   No current facility-administered medications for this visit.     Past Medical History:  Diagnosis Date  . Adenomatous colon polyp   . Allergy   . Angina at rest [facility])    chronic  . Anxiety   . Arthritis   . Bell's palsy 2007  . Carotid artery occlusion   . Cataract    Dr. Dawna Part  . Coronary artery disease   . Depression   . Diabetes mellitus   . Diverticulosis   . Duodenitis   . DVT (deep venous thrombosis) ([facility])    in leg  . Gastropathy   . Heart murmur   . Helicobacter pylori gastritis   . Hyperlipidemia   . Hypertension   . Mild aortic stenosis   . Neuropathy   . Obesity   . Peripheral vascular disease  ([place])   . Post splenectomy syndrome   . Psoriasis   . Psoriatic arthritis ([place])   . Sleep apnea   . SOB (shortness of breath)   . Stroke ([place])   . Tobacco use disorder    recently quit  . Tubular adenoma 08/2013   Dr. Hilarie Fredrickson  . Weak urinary stream     Past Surgical History:  Procedure Laterality Date  . CARDIAC CATHETERIZATION  08/2010   LAD: 80% ISR, RCA: 80% ostial, OM 80-90%  . CAROTID ENDARTERECTOMY  08/2010   left/ Dr. Kellie Simmering  . COLONOSCOPY    . CORONARY ANGIOPLASTY     LAD: before CABG  . CORONARY ARTERY BYPASS GRAFT  09/06/2010   At [facility]: LIMA to LAD, left radial to RCA, sequential SVG to OM3 and 4  . heart stents  Jan 2011   leg stents 06/2009 and 03/2010  . PTA of illiac and SFA  multiple   Dr. Ronalee Belts, s/p revision 11/2012  . SPLENECTOMY      Social History Social History  Substance Use Topics  . Smoking status: Light Tobacco Smoker    Packs/day: 0.75    Years: 40.00    Types: Cigarettes  . Smokeless tobacco: Never Used     Comment: 1 cigarette a day  . Alcohol use No    Family History Family History  Problem Relation Age of Onset  . Lung cancer Father 30  . Arthritis Father   . Breast cancer Sister 71  . Alcoholism Sister   . Dementia Mother   . Alzheimer's disease Mother   . Arthritis Mother   . Alcoholism Brother   . Epilepsy Sister   . Diabetes Paternal Grandfather   . Colon polyps Paternal Grandfather 93  . Alcoholism Sister   . Alcoholism Sister   . Alcoholism Brother      Allergies  Allergen Reactions  . Contrast Media [Iodinated Diagnostic Agents] Rash and Other (See Comments)    Got very hot and red   . Glipizide Other (See Comments)    ANTIDIABETICS. Burning  . Metrizamide Other (See Comments)    Got very hot and red  . Penicillins Hives and Swelling     REVIEW OF SYSTEMS (Negative unless checked)  Constitutional: [] Weight loss  [] Fever  [] Chills Cardiac: [] Chest pain   [] Chest pressure   [] Palpitations   [] Shortness of  breath when laying flat   [] Shortness of breath at rest   []   Shortness of breath with exertion. Vascular:  [] Pain in legs with walking   [] Pain in legs at rest   [] Pain in legs when laying flat   [x] Claudication   [] Pain in feet when walking  [x] Pain in feet at rest  [] Pain in feet when laying flat   [] History of DVT   [] Phlebitis   [x] Swelling in legs   [] Varicose veins   [] Non-healing ulcers Pulmonary:   [] Uses home oxygen   [] Productive cough   [] Hemoptysis   [] Wheeze  [] COPD   [] Asthma Neurologic:  [] Dizziness  [] Blackouts   [] Seizures   [] History of stroke   [] History of TIA  [] Aphasia   [] Temporary blindness   [] Dysphagia   [] Weakness or numbness in arms   [] Weakness or numbness in legs Musculoskeletal:  [x] Arthritis   [] Joint swelling   [] Joint pain   [] Low back pain Hematologic:  [] Easy bruising  [] Easy bleeding   [] Hypercoagulable state   [] Anemic   Gastrointestinal:  [] Blood in stool   [] Vomiting blood  [] Gastroesophageal reflux/heartburn   [] Abdominal pain Genitourinary:  [] Chronic kidney disease   [] Difficult urination  [] Frequent urination  [] Burning with urination   [] Hematuria Skin:  [] Rashes   [] Ulcers   [] Wounds Psychological:  [] History of anxiety   []  History of major depression.  Physical Examination  BP (!) 148/79 (BP Location: Right Arm)   Pulse 75   Resp 16   Ht 5' 6.5" (1.689 m)   Wt 245 lb (111.1 kg)   BMI 38.95 kg/m  Gen:  WD/WN, NAD Head: [place]/AT, No temporalis wasting. Ear/Nose/Throat: Hearing grossly intact, nares w/o erythema or drainage, trachea midline Eyes: Conjunctiva clear. Sclera non-icteric Neck: Supple.  No JVD.  Pulmonary:  Good air movement, no use of accessory muscles.  Cardiac: RRR, normal S1, S2 Vascular:  Vessel Right Left  Radial Palpable Palpable                      Popliteal 1+ Palpable Not Palpable  PT Palpable 1+ Palpable  DP Palpable 1+ Palpable    Musculoskeletal: M/S 5/5 throughout.  No deformity or atrophy. Trace lower  extremity edema. Neurologic: Sensation grossly intact in extremities.  Symmetrical.  Speech is fluent.  Psychiatric: Judgment intact, Mood & affect appropriate for pt's clinical situation. Dermatologic: No rashes or ulcers noted.  No cellulitis or open wounds.       Labs Recent Results (from the past 2160 hour(s))  HM DIABETES FOOT EXAM     Status: None   Collection Time: 03/15/17 12:00 AM  Result Value Ref Range   HM Diabetic Foot Exam done   Comprehensive metabolic panel     Status: Abnormal   Collection Time: 03/15/17 10:21 AM  Result Value Ref Range   Sodium 140 135 - 145 mEq/L   Potassium 4.4 3.5 - 5.1 mEq/L   Chloride 111 96 - 112 mEq/L   CO2 23 19 - 32 mEq/L   Glucose, Bld 124 (H) 70 - 99 mg/dL   BUN 29 (H) 6 - 23 mg/dL   Creatinine, Ser 2.09 (H) 0.40 - 1.50 mg/dL   Total Bilirubin 0.3 0.2 - 1.2 mg/dL   Alkaline Phosphatase 91 39 - 117 U/L   AST 10 0 - 37 U/L   ALT 10 0 - 53 U/L   Total Protein 7.1 6.0 - 8.3 g/dL   Albumin 3.8 3.5 - 5.2 g/dL   Calcium 8.5 8.4 - 10.5 mg/dL   GFR 34.21 (L) >60.00 mL/min  CBC with Differential/Platelet     Status: Abnormal   Collection Time: 03/15/17 10:21 AM  Result Value Ref Range   WBC 6.4 4.0 - 10.5 K/uL   RBC 4.26 4.22 - 5.81 Mil/uL   Hemoglobin 13.5 13.0 - 17.0 g/dL   HCT 41.0 39.0 - 52.0 %   MCV 96.3 78.0 - 100.0 fl   MCHC 33.0 30.0 - 36.0 g/dL   RDW 14.6 11.5 - 15.5 %   Platelets 139.0 (L) 150.0 - 400.0 K/uL   Neutrophils Relative % 56.4 43.0 - 77.0 %   Lymphocytes Relative 32.1 12.0 - 46.0 %   Monocytes Relative 6.8 3.0 - 12.0 %   Eosinophils Relative 3.7 0.0 - 5.0 %   Basophils Relative 1.0 0.0 - 3.0 %   Neutro Abs 3.6 1.4 - 7.7 K/uL   Lymphs Abs 2.1 0.7 - 4.0 K/uL   Monocytes Absolute 0.4 0.1 - 1.0 K/uL   Eosinophils Absolute 0.2 0.0 - 0.7 K/uL   Basophils Absolute 0.1 0.0 - 0.1 K/uL  Lipid panel     Status: Abnormal   Collection Time: 03/15/17 10:21 AM  Result Value Ref Range   Cholesterol 132 0 - 200 mg/dL     Comment: ATP III Classification       Desirable:  < 200 mg/dL               Borderline High:  200 - 239 mg/dL          High:  > = 240 mg/dL   Triglycerides 264.0 (H) 0.0 - 149.0 mg/dL    Comment: Normal:  <150 mg/dLBorderline High:  150 - 199 mg/dL   HDL 22.30 (L) >39.00 mg/dL   VLDL 52.8 (H) 0.0 - 40.0 mg/dL   Total CHOL/HDL Ratio 6     Comment:                Men          Women1/2 Average Risk     3.4          3.3Average Risk          5.0          4.42X Average Risk          9.6          7.13X Average Risk          15.0          11.0                       NonHDL 109.34     Comment: NOTE:  Non-HDL goal should be 30 mg/dL higher than patient's LDL goal (i.e. LDL goal of < 70 mg/dL, would have non-HDL goal of < 100 mg/dL)  LDL cholesterol, direct     Status: None   Collection Time: 03/15/17 10:21 AM  Result Value Ref Range   Direct LDL 61.0 mg/dL    Comment: Optimal:  <100 mg/dLNear or Above Optimal:  100-129 mg/dLBorderline High:  130-159 mg/dLHigh:  160-189 mg/dLVery High:  >190 mg/dL  VAS US CAROTID     Status: None   Collection Time: 03/20/17 11:13 AM  Result Value Ref Range   Right CCA prox sys 47 cm/s   Right CCA prox dias 12 cm/s   Right cca dist sys 106 cm/s   Left CCA prox sys 57 cm/s   Left CCA prox dias 17 cm/s   Left CCA dist sys 60 cm/s  Left CCA dist dias 17 cm/s   Left ICA prox sys -68 cm/s   Left ICA prox dias -21 cm/s   Left ICA dist sys -61 cm/s   Left ICA dist dias -25 cm/s   RIGHT ECA DIAS 29.00 cm/s   RIGHT VERTEBRAL DIAS 40.00 cm/s   LEFT ECA DIAS -17.00 cm/s   LEFT VERTEBRAL DIAS -15.00 cm/s  T4 AND TSH     Status: None   Collection Time: 04/11/17 10:53 AM  Result Value Ref Range   TSH 2.170 0.450 - 4.500 uIU/mL   T4, Total 4.8 4.5 - 12.0 ug/dL  POCT HgB A1C     Status: None   Collection Time: 04/12/17 10:30 AM  Result Value Ref Range   Hemoglobin A1C 5.8     Radiology Ct Chest Lung Cancer Screening Low Dose Wo Contrast  Result Date:  04/09/2017 CLINICAL DATA:  61 year old male current smoker, with 40 pack-year history of smoking, for follow-up lung cancer screening EXAM: CT CHEST WITHOUT CONTRAST LOW-DOSE FOR LUNG CANCER SCREENING TECHNIQUE: Multidetector CT imaging of the chest was performed following the standard protocol without IV contrast. COMPARISON:  Low-dose lung cancer screening CT chest dated 03/27/2016 FINDINGS: Cardiovascular: Heart is normal in size.  No pericardial effusion. 4.6 cm ascending thoracic aortic aneurysm (series 2/ image 24), previously 4.4 cm. Atherosclerotic calcifications aortic arch. Three vessel coronary atherosclerosis. Mediastinum/Nodes: No suspicious mediastinal lymphadenopathy. Visualized thyroid is unremarkable. Lungs/Pleura: Mild centrilobular emphysematous changes. Scattered calcified and noncalcified pulmonary nodules measuring up to 5.8 mm in the left lower lobe, unchanged. No focal consolidation. No pleural effusion or pneumothorax. Upper Abdomen: Visualized upper abdomen is notable for a mildly nodular hepatic contour and mild cholelithiasis (series 2/ image 64). Musculoskeletal: Mild degenerative changes of the visualized thoracolumbar spine. Median sternotomy. IMPRESSION: Lung-RADS 2, benign appearance or behavior. Continue annual screening with low-dose chest CT without contrast in 12 months. Aortic Atherosclerosis (ICD10-I70.0) and Emphysema (ICD10-J43.9). Electronically Signed   By: Julian Hy M.D.   On: 04/09/2017 15:55   US Thyroid  Result Date: 03/29/2017 CLINICAL DATA:  Incidental on [place]. EXAM: THYROID ULTRASOUND TECHNIQUE: Ultrasound examination of the thyroid gland and adjacent soft tissues was performed. COMPARISON:  None available FINDINGS: Parenchymal Echotexture: Mildly heterogenous Isthmus: 5 mm Right lobe: 5.1 x 2.0 x 2.8 cm Left lobe: 4.7 x 1.8 x 2.0 cm _________________________________________________________ Estimated total number of nodules >/= 1 cm: 1 Number of spongiform  nodules >/=  2 cm not described below (TR1): 0 Number of mixed cystic and solid nodules >/= 1.5 cm not described below (TR2): 0 _________________________________________________________ Nodule # 1: Location: Right; Superior Maximum size: 1.5 cm; Other 2 dimensions: 1.2 x 1.3 cm Composition: solid/almost completely solid (2) Echogenicity: hypoechoic (2) Shape: not taller-than-wide (0) Margins: ill-defined (0) Echogenic foci: none (0) ACR TI-RADS total points: 4. ACR TI-RADS risk category: TR4 (4-6 points). ACR TI-RADS recommendations: **Given size (>/= 1.5 cm) and appearance, fine needle aspiration of this moderately suspicious nodule should be considered based on TI-RADS criteria. _________________________________________________________ IMPRESSION: 1.5 cm right upper pole TR 4 nodule meets criteria for biopsy as above. The above is in keeping with the ACR TI-RADS recommendations - J Am Coll Radiol 2017;14:587-595. Electronically Signed   By: Jerilynn Mages.  Shick M.D.   On: 03/29/2017 15:04     Assessment/Plan  Essential hypertension blood pressure control important in reducing the progression of atherosclerotic disease. On appropriate oral medications.   Type 2 diabetes mellitus with diabetic peripheral angiopathy without gangrene ([place]) blood  glucose control important in reducing the progression of atherosclerotic disease. Also, involved in wound healing. On appropriate medications.   Tobacco use We had a discussion for approximately 3 minutes regarding the absolute need for smoking cessation due to the deleterious nature of tobacco on the vascular system. We discussed the tobacco use would diminish patency of any intervention, and likely significantly worsen progressio of disease. We discussed multiple agents for quitting including replacement therapy or medications to reduce cravings such as Chantix. The patient voices their understanding of the importance of smoking cessation.   PVD (peripheral vascular  disease) ([place]) His ABIs today are stable on the right at 1.01 but have dropped on the left now down to 0.84. Duplex shows 50-99% stenosis in the left SFA stent as well as 50-74% stenosis in the left deep femoral artery. We had a long discussion today regarding treatment options. Given the significant stenosis within the stent, intervention would certainly be reasonable. He does not feel as if he is symptomatic enough that current to warrant intervention. He would prefer a follow-up duplex. I think that is reasonable but I would shorten his follow-up to 6 months and if he has worsening symptoms he needs to contact our office sooner. Continue aspirin, Plavix, and statin agent.    Leotis Pain, MD  04/16/2017 4:48 PM    This note was created with Dragon medical transcription system.  Any errors from dictation are purely unintentional

## 2017-04-16 NOTE — Assessment & Plan Note (Signed)
His ABIs today are stable on the right at 1.01 but have dropped on the left now down to 0.84. Duplex shows 50-99% stenosis in the left SFA stent as well as 50-74% stenosis in the left deep femoral artery. We had a long discussion today regarding treatment options. Given the significant stenosis within the stent, intervention would certainly be reasonable. He does not feel as if he is symptomatic enough that current to warrant intervention. He would prefer a follow-up duplex. I think that is reasonable but I would shorten his follow-up to 6 months and if he has worsening symptoms he needs to contact our office sooner. Continue aspirin, Plavix, and statin agent.

## 2017-04-16 NOTE — Patient Instructions (Signed)

## 2017-04-16 NOTE — Assessment & Plan Note (Signed)
blood pressure control important in reducing the progression of atherosclerotic disease. On appropriate oral medications.  

## 2017-04-16 NOTE — Assessment & Plan Note (Signed)
blood glucose control important in reducing the progression of atherosclerotic disease. Also, involved in wound healing. On appropriate medications.  

## 2017-04-17 ENCOUNTER — Inpatient Hospital Stay: Payer: Self-pay

## 2017-04-17 ENCOUNTER — Ambulatory Visit (INDEPENDENT_AMBULATORY_CARE_PROVIDER_SITE_OTHER): Payer: Medicare Other | Admitting: General Surgery

## 2017-04-17 ENCOUNTER — Ambulatory Visit: Payer: Self-pay | Admitting: Psychology

## 2017-04-17 ENCOUNTER — Encounter: Payer: Self-pay | Admitting: General Surgery

## 2017-04-17 VITALS — BP 140/72 | HR 79 | Resp 14 | Ht 66.5 in | Wt 244.0 lb

## 2017-04-17 DIAGNOSIS — E041 Nontoxic single thyroid nodule: Secondary | ICD-10-CM

## 2017-04-17 NOTE — Patient Instructions (Addendum)
Restart your Plavix in 2 days after today's procedure. Use Tylenol for pain control.  You may shower. You can remove the waterproof dressing in two days, but leave the steri strips in place.  Use ice as needed for comfort.   We will call you with your results.

## 2017-04-17 NOTE — Progress Notes (Signed)
Patient ID: [name] [name], male   DOB: [birth year], 61 y.o.   MRN: [id number]  Chief Complaint  Patient presents with  . Procedure    Thyroid FNA    HPI [name] [name] is a 61 y.o. male here for a scheduled biopsy of a right superior thyroid nodule that was evaluated in clinic on 04/11/17 where he agreed to have it biopsied. He has been off of his Plavix for the past 5 days.   HPI  Past Medical History:  Diagnosis Date  . Adenomatous colon polyp   . Allergy   . Angina at rest [facility])    chronic  . Anxiety   . Arthritis   . Bell's palsy 2007  . Carotid artery occlusion   . Cataract    Dr. Dawna Part  . Coronary artery disease   . Depression   . Diabetes mellitus   . Diverticulosis   . Duodenitis   . DVT (deep venous thrombosis) (HCC)    in leg  . Gastropathy   . Heart murmur   . Helicobacter pylori gastritis   . Hyperlipidemia   . Hypertension   . Mild aortic stenosis   . Neuropathy   . Obesity   . Peripheral vascular disease ([place])   . Post splenectomy syndrome   . Psoriasis   . Psoriatic arthritis ([place])   . Sleep apnea   . SOB (shortness of breath)   . Stroke ([place])   . Tobacco use disorder    recently quit  . Tubular adenoma 08/2013   Dr. Hilarie Fredrickson  . Weak urinary stream     Past Surgical History:  Procedure Laterality Date  . CARDIAC CATHETERIZATION  08/2010   LAD: 80% ISR, RCA: 80% ostial, OM 80-90%  . CAROTID ENDARTERECTOMY  08/2010   left/ Dr. Kellie Simmering  . COLONOSCOPY    . CORONARY ANGIOPLASTY     LAD: before CABG  . CORONARY ARTERY BYPASS GRAFT  09/06/2010   At [facility]: LIMA to LAD, left radial to RCA, sequential SVG to OM3 and 4  . heart stents  Jan 2011   leg stents 06/2009 and 03/2010  . PTA of illiac and SFA  multiple   Dr. Ronalee Belts, s/p revision 11/2012  . SPLENECTOMY      Family History  Problem Relation Age of Onset  . Lung cancer Father 70  . Arthritis Father   . Breast cancer Sister 58  . Alcoholism Sister   . Dementia Mother   . Alzheimer's  disease Mother   . Arthritis Mother   . Alcoholism Brother   . Epilepsy Sister   . Diabetes Paternal Grandfather   . Colon polyps Paternal Grandfather 35  . Alcoholism Sister   . Alcoholism Sister   . Alcoholism Brother     Social History Social History  Substance Use Topics  . Smoking status: Former Smoker    Packs/day: 0.75    Years: 40.00    Types: Cigarettes    Quit date: 04/15/2017  . Smokeless tobacco: Never Used     Comment: 1 cigarette a day  . Alcohol use No    Allergies  Allergen Reactions  . Contrast Media [Iodinated Diagnostic Agents] Rash and Other (See Comments)    Got very hot and red   . Glipizide Other (See Comments)    ANTIDIABETICS. Burning  . Metrizamide Other (See Comments)    Got very hot and red  . Penicillins Hives and Swelling    Current Outpatient Prescriptions  Medication  Sig Dispense Refill  . ACCU-CHEK FASTCLIX LANCETS MISC Use to check sugar 6 times daily. 600 each 5  . albuterol (PROAIR HFA) 108 (90 Base) MCG/ACT inhaler Inhale 2 puffs into the lungs every 6 (six) hours as needed for shortness of breath.     [name] aspirin 81 MG tablet Take 81 mg by mouth daily.    [name] BAYER CONTOUR TEST test strip TEST THREE TIMES A DAY AS DIRECTED 300 each 0  . buPROPion (WELLBUTRIN XL) 300 MG 24 hr tablet Take 1 tablet (300 mg total) by mouth daily. 30 tablet 2  . busPIRone (BUSPAR) 10 MG tablet Take 2 tablets (20 mg total) by mouth 2 (two) times daily. 120 tablet 4  . carvedilol (COREG) 12.5 MG tablet TAKE 1 TABLET BY MOUTH TWICE DAILY 60 tablet 2  . Cholecalciferol (VITAMIN D PO) Take by mouth.    . clopidogrel (PLAVIX) 75 MG tablet Take 1 tablet (75 mg total) by mouth daily. 90 tablet 3  . cyclobenzaprine (FLEXERIL) 10 MG tablet take 1 tablet by mouth three times a day if needed for muscle spasm 30 tablet 0  . insulin NPH Human (NOVOLIN N RELION) 100 UNIT/ML injection Inject 40 units in am and 20 units in the evening 40 mL 2  . insulin regular (NOVOLIN  R,HUMULIN R) 250 units/2.65mL (100 units/mL) injection Inject 0.1-0.3 mLs (10-30 Units total) into the skin 3 (three) times daily before meals. 40 mL 5  . ketoconazole (NIZORAL) 2 % cream apply to affected area AS NEEDED FOR IRRITATION  0  . losartan (COZAAR) 25 MG tablet take 1 tablet by mouth once daily 90 tablet 3  . MAGNESIUM PO Take by mouth.    . mupirocin cream (BACTROBAN) 2 % Apply 1 application topically 2 (two) times daily. 15 g 0  . nitroGLYCERIN (NITROSTAT) 0.4 MG SL tablet Place 1 tablet (0.4 mg total) under the tongue every 5 (five) minutes as needed. 25 tablet 1  . NOVOLIN N RELION 100 UNIT/ML injection INJECT 40 UNITS IN THE MORNING AND 20 UNITS IN THE EVENING 40 mL 2  . omeprazole (PRILOSEC) 20 MG capsule Take 1 capsule (20 mg total) by mouth 2 (two) times daily. (Patient taking differently: Take 20 mg by mouth 2 (two) times daily as needed (HEARTBURN). ) 20 capsule 0  . sertraline (ZOLOFT) 100 MG tablet Take 1.5 tablets (150 mg total) by mouth daily. 45 tablet 2  . simvastatin (ZOCOR) 20 MG tablet take 1 tablet by mouth once daily AT 6PM 90 tablet 3  . tamsulosin (FLOMAX) 0.4 MG CAPS capsule take 2 capsule by mouth daily 60 capsule 11  . topiramate (TOPAMAX) 100 MG tablet Take 1 tablet (100 mg total) by mouth 2 (two) times daily. 60 tablet 5  . triamcinolone ointment (KENALOG) 0.1 % apply to affected area twice a day AVOID FACE, GROIN AND UNDERARMS  0   No current facility-administered medications for this visit.     Review of Systems Review of Systems  Constitutional: Negative.   Respiratory: Negative.   Cardiovascular: Negative.     Blood pressure 140/72, pulse 79, resp. rate 14, height 5' 6.5" (1.689 m), weight 244 lb (110.7 kg).  Physical Exam Physical Exam  Constitutional: He is oriented to person, place, and time. He appears well-developed and well-nourished.  Neurological: He is alert and oriented to person, place, and time.  Skin: Skin is warm and dry.   Psychiatric: He has a normal mood and affect. His behavior is  normal.    Data Reviewed Prior notes and labs reviewed   Assessment    Right superior thyroid nodule - US guided biopsy in clinic today and patient was agreeable. It was evaluated on [place] on 03/29/17 which showed a 1.5 cm x 1.2 x 1.3 cm nodule in the right superior lobe. Not palpable on physical exam. TSH and T4 from 04/11/17 are normal.   Procedure: Core biopsy of right superior thyroid nodule With US guidance, 2 mL 1% xylocaine was instilled with a 25 g needle. Area was prepped and draped as a sterile field. 2 passes were made with a Bard 20g spring loaded device. Specimen was   sent for testing. Dressing and steri-strips were placed over site. Patient tolerated the procedure well and was counseled on wound care.     Plan    Return to clinic pending pathology results. Can resume Plavix in 2 days and use Tylenol for pain control.     HPI, Physical Exam, Assessment and Plan have been scribed under the direction and in the presence of Mckinley Jewel, MD  Concepcion Living, LPN  I have completed the exam and reviewed the above documentation for accuracy and completeness.  I agree with the above.  Haematologist has been used and any errors in dictation or transcription are unintentional.  Seeplaputhur G. Jamal Collin, M.D., F.A.C.S.   Junie Panning G 04/17/2017, 1:34 PM

## 2017-04-22 ENCOUNTER — Telehealth: Payer: Self-pay | Admitting: *Deleted

## 2017-04-22 NOTE — Telephone Encounter (Signed)
Patient called wanting to know results. He stated that he would like to know them as soon as he can.

## 2017-04-24 ENCOUNTER — Ambulatory Visit: Payer: Self-pay | Admitting: Psychology

## 2017-04-25 ENCOUNTER — Telehealth: Payer: Self-pay | Admitting: General Surgery

## 2017-04-25 NOTE — Telephone Encounter (Signed)
Call the patient back in advised him on the pathology report -inadequate sampling. Recommend follow-up in 6 weeks and reassess with ultrasound and consider repeating FNA. Patient okay with this plan

## 2017-04-25 NOTE — Telephone Encounter (Signed)
-----   Message from Christene Lye, MD sent at 04/24/2017 11:45 AM EDT ----- Likely sampling error. Left message on phone to pt-asked to call back  May need to repeat FNA

## 2017-04-25 NOTE — Telephone Encounter (Signed)
He states he talked to Dr Jamal Collin this morning. And was notified, patient pleased. Discussed follow-up appointments, patient agrees

## 2017-04-30 ENCOUNTER — Ambulatory Visit (INDEPENDENT_AMBULATORY_CARE_PROVIDER_SITE_OTHER): Payer: Medicare Other | Admitting: Psychiatry

## 2017-04-30 ENCOUNTER — Encounter: Payer: Self-pay | Admitting: Psychiatry

## 2017-04-30 VITALS — BP 136/75 | HR 72 | Temp 97.7°F | Wt 243.0 lb

## 2017-04-30 DIAGNOSIS — F411 Generalized anxiety disorder: Secondary | ICD-10-CM | POA: Diagnosis not present

## 2017-04-30 DIAGNOSIS — F331 Major depressive disorder, recurrent, moderate: Secondary | ICD-10-CM

## 2017-04-30 DIAGNOSIS — I6521 Occlusion and stenosis of right carotid artery: Secondary | ICD-10-CM | POA: Diagnosis not present

## 2017-04-30 MED ORDER — BUSPIRONE HCL 10 MG PO TABS: 20.0000 mg | ORAL_TABLET | Freq: Two times a day (BID) | ORAL | 1 refills | Status: DC

## 2017-04-30 MED ORDER — SERTRALINE HCL 100 MG PO TABS: 150.0000 mg | ORAL_TABLET | Freq: Every day | ORAL | 1 refills | Status: DC

## 2017-04-30 MED ORDER — BUPROPION HCL ER (XL) 300 MG PO TB24: 300.0000 mg | ORAL_TABLET | Freq: Every day | ORAL | 1 refills | Status: DC

## 2017-04-30 NOTE — Progress Notes (Signed)
Patient ID: [name] [name], male   DOB: 1956-02-21, 61 y.o.   MRN: [id number] [facility] MD/PA/NP OP Progress Note  04/30/2017 9:45 AM [name] [name]  MRN:  [id number]  Subjective:  Patient returns for follow-up of generalized anxiety disorder and depression. Patient reports that he doing quite well. His blood pressure is normal and he has been compliant with his medications. Mood has been good and he has enjoyed his summer. He has been spending time with his family.  Denies any problems.  Chief Complaint: doing well Chief Complaint    Follow-up; Medication Refill     Visit Diagnosis:     ICD-10-CM   1. GAD (generalized anxiety disorder) F41.1   2. Major depressive disorder, recurrent episode, moderate (HCC) F33.1     Past Medical History:  Past Medical History:  Diagnosis Date  . Adenomatous colon polyp   . Allergy   . Angina at rest [facility])    chronic  . Anxiety   . Arthritis   . Bell's palsy 2007  . Carotid artery occlusion   . Cataract    Dr. Dawna Part  . Coronary artery disease   . Depression   . Diabetes mellitus   . Diverticulosis   . Duodenitis   . DVT (deep venous thrombosis) (HCC)    in leg  . Gastropathy   . Heart murmur   . Helicobacter pylori gastritis   . Hyperlipidemia   . Hypertension   . Mild aortic stenosis   . Neuropathy   . Obesity   . Peripheral vascular disease ([place])   . Post splenectomy syndrome   . Psoriasis   . Psoriatic arthritis ([place])   . Sleep apnea   . SOB (shortness of breath)   . Stroke ([place])   . Tobacco use disorder    recently quit  . Tubular adenoma 08/2013   Dr. Hilarie Fredrickson  . Weak urinary stream     Past Surgical History:  Procedure Laterality Date  . CARDIAC CATHETERIZATION  08/2010   LAD: 80% ISR, RCA: 80% ostial, OM 80-90%  . CAROTID ENDARTERECTOMY  08/2010   left/ Dr. Kellie Simmering  . COLONOSCOPY    . CORONARY ANGIOPLASTY     LAD: before CABG  . CORONARY ARTERY BYPASS GRAFT  09/06/2010   At [facility]: LIMA to LAD, left radial to RCA,  sequential SVG to OM3 and 4  . heart stents  Jan 2011   leg stents 06/2009 and 03/2010  . PTA of illiac and SFA  multiple   Dr. Ronalee [name], s/p revision 11/2012  . SPLENECTOMY     Family History:  Family History  Problem Relation Age of Onset  . Lung cancer Father 3  . Arthritis Father   . Breast cancer Sister 23  . Alcoholism Sister   . Dementia Mother   . Alzheimer's disease Mother   . Arthritis Mother   . Alcoholism Brother   . Epilepsy Sister   . Diabetes Paternal Grandfather   . Colon polyps Paternal Grandfather 6  . Alcoholism Sister   . Alcoholism Sister   . Alcoholism Brother    Social History:  Social History   Social History  . Marital status: Married    Spouse name: N/A  . Number of children: 0  . Years of education: N/A   Occupational History  . 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 Smoking status: Former Smoker    Packs/day:  0.75    Years: 40.00    Types: Cigarettes    Quit date: 04/15/2017  . Smokeless tobacco: Never Used     Comment: 1 cigarette a day  . Alcohol use No  . Drug use: No  . Sexual activity: No   Other Topics Concern  . None   Social History Narrative   Has living will   Wife is health care POA---brother [name] [name] is alternate   Would accept resuscitation --but no prolonged ventilation   No tube feeds if cognitively unaware      Additional History:   Assessment:   Musculoskeletal: Strength & Muscle Tone: within normal limits Gait & Station: unsteady walks with cane Patient leans: N/A  Psychiatric Specialty Exam: Medication Refill   Anxiety  Patient reports no insomnia, nervous/anxious behavior or suicidal ideas.    Depression         Associated symptoms include does not have insomnia and no suicidal ideas.  Past medical history includes anxiety.     Review of Systems  Psychiatric/Behavioral: Negative.  Negative for depression, hallucinations, memory loss,  substance abuse and suicidal ideas. The patient is not nervous/anxious and does not have insomnia.   All other systems reviewed and are negative.   Blood pressure 136/75, pulse 72, temperature 97.7 F (36.5 C), temperature source Oral, weight 243 lb (110.2 kg).Body mass index is 38.63 kg/m.  General Appearance: Well Groomed  Eye Contact:  Good  Speech:  Normal Rate  Volume:  Normal  Mood:  Doing quite well  Affect:  Congruent  Thought Process:  Normal  Orientation:  Full (Time, Place, and Person)  Thought Content:  Negative  Suicidal Thoughts:  No  Homicidal Thoughts:  No  Memory:  Immediate;   Good Recent;   Good Remote;   Good  Judgement:  Good  Insight:  Good  Psychomotor Activity:  Negative  Concentration:  Good  Recall:  Good  Fund of Knowledge: Good  Language: Good  Akathisia:  Negative  Handed:  Right  AIMS (if indicated):  N/A  Assets:  Communication Skills Desire for Improvement Social Support  ADL's:  Intact  Cognition: WNL  Sleep:  Good    Is the patient at risk to self?  No. Has the patient been a risk to self in the past 6 months?  No. Has the patient been a risk to self within the distant past?  No. Is the patient a risk to others?  No. Has the patient been a risk to others in the past 6 months?  No. Has the patient been a risk to others within the distant past?  No.  Current Medications: Current Outpatient Prescriptions  Medication Sig Dispense Refill  . ACCU-CHEK FASTCLIX LANCETS MISC Use to check sugar 6 times daily. 600 each 5  . albuterol (PROAIR HFA) 108 (90 Base) MCG/ACT inhaler Inhale 2 puffs into the lungs every 6 (six) hours as needed for shortness of breath.     [name] aspirin 81 MG tablet Take 81 mg by mouth daily.    [name] BAYER CONTOUR TEST test strip TEST THREE TIMES A DAY AS DIRECTED 300 each 0  . buPROPion (WELLBUTRIN XL) 300 MG 24 hr tablet Take 1 tablet (300 mg total) by mouth daily. 30 tablet 2  . busPIRone (BUSPAR) 10 MG tablet Take 2  tablets (20 mg total) by mouth 2 (two) times daily. 120 tablet 4  . carvedilol (COREG) 12.5 MG tablet TAKE 1 TABLET BY MOUTH TWICE DAILY 60 tablet  2  . Cholecalciferol (VITAMIN D PO) Take by mouth.    . clopidogrel (PLAVIX) 75 MG tablet Take 1 tablet (75 mg total) by mouth daily. 90 tablet 3  . cyclobenzaprine (FLEXERIL) 10 MG tablet take 1 tablet by mouth three times a day if needed for muscle spasm 30 tablet 0  . insulin NPH Human (NOVOLIN N RELION) 100 UNIT/ML injection Inject 40 units in am and 20 units in the evening 40 mL 2  . insulin regular (NOVOLIN R,HUMULIN R) 250 units/2.69mL (100 units/mL) injection Inject 0.1-0.3 mLs (10-30 Units total) into the skin 3 (three) times daily before meals. 40 mL 5  . ketoconazole (NIZORAL) 2 % cream apply to affected area AS NEEDED FOR IRRITATION  0  . losartan (COZAAR) 25 MG tablet take 1 tablet by mouth once daily 90 tablet 3  . MAGNESIUM PO Take by mouth.    . mupirocin cream (BACTROBAN) 2 % Apply 1 application topically 2 (two) times daily. 15 g 0  . nitroGLYCERIN (NITROSTAT) 0.4 MG SL tablet Place 1 tablet (0.4 mg total) under the tongue every 5 (five) minutes as needed. 25 tablet 1  . NOVOLIN N RELION 100 UNIT/ML injection INJECT 40 UNITS IN THE MORNING AND 20 UNITS IN THE EVENING 40 mL 2  . omeprazole (PRILOSEC) 20 MG capsule Take 1 capsule (20 mg total) by mouth 2 (two) times daily. (Patient taking differently: Take 20 mg by mouth 2 (two) times daily as needed (HEARTBURN). ) 20 capsule 0  . sertraline (ZOLOFT) 100 MG tablet Take 1.5 tablets (150 mg total) by mouth daily. 45 tablet 2  . simvastatin (ZOCOR) 20 MG tablet take 1 tablet by mouth once daily AT 6PM 90 tablet 3  . tamsulosin (FLOMAX) 0.4 MG CAPS capsule take 2 capsule by mouth daily 60 capsule 11  . topiramate (TOPAMAX) 100 MG tablet Take 1 tablet (100 mg total) by mouth 2 (two) times daily. 60 tablet 5  . triamcinolone ointment (KENALOG) 0.1 % apply to affected area twice a day AVOID FACE,  GROIN AND UNDERARMS  0   No current facility-administered medications for this visit.     Medical Decision Making:  Established Problem, Stable/Improving (1) and Review of New Medication or Change in Dosage (2)  Treatment Plan Summary:Medication management and Plan   Depressive disorder, moderate, recurrent-  Continue Zoloft at 150mg  po daily. Continue  Wellbutrin XL 300 mg in the morning. Continue to see therapist, Dr. Cristy Folks.  Generalized anxiety disorder-stable We'll continue his BuSpar to 20 mg twice daily.    Patient will follow up in 3  months. Call before if needed   Hence Derrick 04/30/2017, 9:45 AM

## 2017-05-01 ENCOUNTER — Ambulatory Visit (INDEPENDENT_AMBULATORY_CARE_PROVIDER_SITE_OTHER): Payer: Medicare Other

## 2017-05-01 ENCOUNTER — Ambulatory Visit (INDEPENDENT_AMBULATORY_CARE_PROVIDER_SITE_OTHER): Payer: Medicare Other | Admitting: Psychology

## 2017-05-01 DIAGNOSIS — Z23 Encounter for immunization: Secondary | ICD-10-CM

## 2017-05-01 DIAGNOSIS — F4312 Post-traumatic stress disorder, chronic: Secondary | ICD-10-CM | POA: Diagnosis not present

## 2017-05-02 DIAGNOSIS — L4050 Arthropathic psoriasis, unspecified: Secondary | ICD-10-CM | POA: Diagnosis not present

## 2017-05-02 DIAGNOSIS — M0579 Rheumatoid arthritis with rheumatoid factor of multiple sites without organ or systems involvement: Secondary | ICD-10-CM | POA: Diagnosis not present

## 2017-05-02 DIAGNOSIS — Z79899 Other long term (current) drug therapy: Secondary | ICD-10-CM | POA: Diagnosis not present

## 2017-05-08 ENCOUNTER — Ambulatory Visit: Payer: Self-pay | Admitting: Psychology

## 2017-05-15 ENCOUNTER — Ambulatory Visit: Payer: Self-pay | Admitting: Psychology

## 2017-05-21 DIAGNOSIS — N179 Acute kidney failure, unspecified: Secondary | ICD-10-CM | POA: Diagnosis not present

## 2017-05-21 DIAGNOSIS — R809 Proteinuria, unspecified: Secondary | ICD-10-CM | POA: Diagnosis not present

## 2017-05-21 DIAGNOSIS — I129 Hypertensive chronic kidney disease with stage 1 through stage 4 chronic kidney disease, or unspecified chronic kidney disease: Secondary | ICD-10-CM | POA: Diagnosis not present

## 2017-05-21 DIAGNOSIS — N183 Chronic kidney disease, stage 3 (moderate): Secondary | ICD-10-CM | POA: Diagnosis not present

## 2017-05-21 DIAGNOSIS — E1122 Type 2 diabetes mellitus with diabetic chronic kidney disease: Secondary | ICD-10-CM | POA: Diagnosis not present

## 2017-05-22 ENCOUNTER — Ambulatory Visit (INDEPENDENT_AMBULATORY_CARE_PROVIDER_SITE_OTHER): Payer: Medicare Other | Admitting: Psychology

## 2017-05-22 DIAGNOSIS — R809 Proteinuria, unspecified: Secondary | ICD-10-CM | POA: Diagnosis not present

## 2017-05-22 DIAGNOSIS — N179 Acute kidney failure, unspecified: Secondary | ICD-10-CM | POA: Diagnosis not present

## 2017-05-22 DIAGNOSIS — I129 Hypertensive chronic kidney disease with stage 1 through stage 4 chronic kidney disease, or unspecified chronic kidney disease: Secondary | ICD-10-CM | POA: Diagnosis not present

## 2017-05-22 DIAGNOSIS — F4312 Post-traumatic stress disorder, chronic: Secondary | ICD-10-CM

## 2017-05-22 DIAGNOSIS — F4323 Adjustment disorder with mixed anxiety and depressed mood: Secondary | ICD-10-CM

## 2017-05-22 DIAGNOSIS — E1122 Type 2 diabetes mellitus with diabetic chronic kidney disease: Secondary | ICD-10-CM | POA: Diagnosis not present

## 2017-05-22 DIAGNOSIS — N183 Chronic kidney disease, stage 3 (moderate): Secondary | ICD-10-CM | POA: Diagnosis not present

## 2017-05-23 ENCOUNTER — Other Ambulatory Visit: Payer: Self-pay | Admitting: Nephrology

## 2017-05-23 DIAGNOSIS — N183 Chronic kidney disease, stage 3 unspecified: Secondary | ICD-10-CM

## 2017-05-23 DIAGNOSIS — N179 Acute kidney failure, unspecified: Secondary | ICD-10-CM

## 2017-05-29 ENCOUNTER — Ambulatory Visit: Payer: Self-pay | Admitting: Psychology

## 2017-05-29 DIAGNOSIS — E113393 Type 2 diabetes mellitus with moderate nonproliferative diabetic retinopathy without macular edema, bilateral: Secondary | ICD-10-CM | POA: Diagnosis not present

## 2017-05-29 LAB — HM DIABETES EYE EXAM

## 2017-06-03 DIAGNOSIS — E1142 Type 2 diabetes mellitus with diabetic polyneuropathy: Secondary | ICD-10-CM | POA: Diagnosis not present

## 2017-06-03 DIAGNOSIS — S93312A Subluxation of tarsal joint of left foot, initial encounter: Secondary | ICD-10-CM | POA: Diagnosis not present

## 2017-06-03 DIAGNOSIS — B351 Tinea unguium: Secondary | ICD-10-CM | POA: Diagnosis not present

## 2017-06-04 DIAGNOSIS — N179 Acute kidney failure, unspecified: Secondary | ICD-10-CM | POA: Diagnosis not present

## 2017-06-04 DIAGNOSIS — N183 Chronic kidney disease, stage 3 (moderate): Secondary | ICD-10-CM | POA: Diagnosis not present

## 2017-06-05 ENCOUNTER — Ambulatory Visit: Payer: Self-pay | Admitting: Psychology

## 2017-06-06 ENCOUNTER — Encounter: Payer: Self-pay | Admitting: Internal Medicine

## 2017-06-10 ENCOUNTER — Encounter: Payer: Self-pay | Admitting: General Surgery

## 2017-06-10 ENCOUNTER — Inpatient Hospital Stay: Payer: Self-pay

## 2017-06-10 ENCOUNTER — Ambulatory Visit (INDEPENDENT_AMBULATORY_CARE_PROVIDER_SITE_OTHER): Payer: Medicare Other | Admitting: General Surgery

## 2017-06-10 VITALS — BP 138/82 | HR 90 | Resp 16 | Ht 66.0 in | Wt 223.0 lb

## 2017-06-10 DIAGNOSIS — E041 Nontoxic single thyroid nodule: Secondary | ICD-10-CM

## 2017-06-10 DIAGNOSIS — E079 Disorder of thyroid, unspecified: Secondary | ICD-10-CM | POA: Diagnosis not present

## 2017-06-10 NOTE — Patient Instructions (Signed)
Follow up as needed

## 2017-06-10 NOTE — Progress Notes (Signed)
Patient ID: [name] [name], male   DOB: 11/26/1955, 61 y.o.   MRN: [id number]  Chief Complaint  Patient presents with  . Follow-up    thyroid nodule    HPI [name] [name] is a 61 y.o. male here for Thyroid follow up and ultrasound. [facility] biopsy was done previously but had insufficient tissue for diagnosis. Asked pt to present for repeat FNA .He reports that he is doing well.  HPI  Past Medical History:  Diagnosis Date  . Adenomatous colon polyp   . Allergy   . Angina at rest [facility])    chronic  . Anxiety   . Arthritis   . Bell's palsy 2007  . Carotid artery occlusion   . Cataract    Dr. Dawna Part  . Coronary artery disease   . Depression   . Diabetes mellitus   . Diverticulosis   . Duodenitis   . DVT (deep venous thrombosis) (HCC)    in leg  . Gastropathy   . Heart murmur   . Helicobacter pylori gastritis   . Hyperlipidemia   . Hypertension   . Mild aortic stenosis   . Neuropathy   . Obesity   . Peripheral vascular disease ([place])   . Post splenectomy syndrome   . Psoriasis   . Psoriatic arthritis ([place])   . Sleep apnea   . SOB (shortness of breath)   . Stroke ([place])   . Tobacco use disorder    recently quit  . Tubular adenoma 08/2013   Dr. Hilarie Fredrickson  . Weak urinary stream     Past Surgical History:  Procedure Laterality Date  . CARDIAC CATHETERIZATION  08/2010   LAD: 80% ISR, RCA: 80% ostial, OM 80-90%  . CAROTID ENDARTERECTOMY  08/2010   left/ Dr. Kellie Simmering  . COLONOSCOPY    . CORONARY ANGIOPLASTY     LAD: before CABG  . CORONARY ARTERY BYPASS GRAFT  09/06/2010   At [facility]: LIMA to LAD, left radial to RCA, sequential SVG to OM3 and 4  . heart stents  Jan 2011   leg stents 06/2009 and 03/2010  . PTA of illiac and SFA  multiple   Dr. Ronalee Belts, s/p revision 11/2012  . SPLENECTOMY      Family History  Problem Relation Age of Onset  . Lung cancer Father 1  . Arthritis Father   . Breast cancer Sister 69  . Alcoholism Sister   . Dementia Mother   . Alzheimer's  disease Mother   . Arthritis Mother   . Alcoholism Brother   . Epilepsy Sister   . Diabetes Paternal Grandfather   . Colon polyps Paternal Grandfather 4  . Alcoholism Sister   . Alcoholism Sister   . Alcoholism Brother     Social History Social History   Tobacco Use  . Smoking status: Former Smoker    Packs/day: 0.75    Years: 40.00    Pack years: 30.00    Types: Cigarettes    Last attempt to quit: 04/15/2017    Years since quitting: 0.1  . Smokeless tobacco: Never Used  . Tobacco comment: 1 cigarette a day  Substance Use Topics  . Alcohol use: No    Alcohol/week: 0.0 oz  . Drug use: No    Allergies  Allergen Reactions  . Contrast Media [Iodinated Diagnostic Agents] Rash and Other (See Comments)    Got very hot and red   . Glipizide Other (See Comments)    ANTIDIABETICS. Burning  . Metrizamide Other (See  Comments)    Got very hot and red  . Penicillins Hives and Swelling    Current Outpatient Medications  Medication Sig Dispense Refill  . ACCU-CHEK FASTCLIX LANCETS MISC Use to check sugar 6 times daily. 600 each 5  . albuterol (PROAIR HFA) 108 (90 Base) MCG/ACT inhaler Inhale 2 puffs into the lungs every 6 (six) hours as needed for shortness of breath.     [name] aspirin 81 MG tablet Take 81 mg by mouth daily.    [name] BAYER CONTOUR TEST test strip TEST THREE TIMES A DAY AS DIRECTED 300 each 0  . buPROPion (WELLBUTRIN XL) 300 MG 24 hr tablet Take 1 tablet (300 mg total) by mouth daily. 90 tablet 1  . busPIRone (BUSPAR) 10 MG tablet Take 2 tablets (20 mg total) by mouth 2 (two) times daily. 360 tablet 1  . carvedilol (COREG) 12.5 MG tablet TAKE 1 TABLET BY MOUTH TWICE DAILY 60 tablet 2  . Cholecalciferol (VITAMIN D PO) Take by mouth.    . clopidogrel (PLAVIX) 75 MG tablet Take 1 tablet (75 mg total) by mouth daily. 90 tablet 3  . cyclobenzaprine (FLEXERIL) 10 MG tablet take 1 tablet by mouth three times a day if needed for muscle spasm 30 tablet 0  . insulin NPH Human  (NOVOLIN N RELION) 100 UNIT/ML injection Inject 40 units in am and 20 units in the evening 40 mL 2  . insulin regular (NOVOLIN R,HUMULIN R) 250 units/2.38mL (100 units/mL) injection Inject 0.1-0.3 mLs (10-30 Units total) into the skin 3 (three) times daily before meals. 40 mL 5  . ketoconazole (NIZORAL) 2 % cream apply to affected area AS NEEDED FOR IRRITATION  0  . losartan (COZAAR) 25 MG tablet take 1 tablet by mouth once daily 90 tablet 3  . MAGNESIUM PO Take by mouth.    . mupirocin cream (BACTROBAN) 2 % Apply 1 application topically 2 (two) times daily. 15 g 0  . nitroGLYCERIN (NITROSTAT) 0.4 MG SL tablet Place 1 tablet (0.4 mg total) under the tongue every 5 (five) minutes as needed. 25 tablet 1  . omeprazole (PRILOSEC) 20 MG capsule Take 1 capsule (20 mg total) by mouth 2 (two) times daily. (Patient taking differently: Take 20 mg by mouth 2 (two) times daily as needed (HEARTBURN). ) 20 capsule 0  . sertraline (ZOLOFT) 100 MG tablet Take 1.5 tablets (150 mg total) by mouth daily. 135 tablet 1  . simvastatin (ZOCOR) 20 MG tablet take 1 tablet by mouth once daily AT 6PM 90 tablet 3  . tamsulosin (FLOMAX) 0.4 MG CAPS capsule take 2 capsule by mouth daily 60 capsule 11  . topiramate (TOPAMAX) 100 MG tablet Take 1 tablet (100 mg total) by mouth 2 (two) times daily. 60 tablet 5  . triamcinolone ointment (KENALOG) 0.1 % apply to affected area twice a day AVOID FACE, GROIN AND UNDERARMS  0   No current facility-administered medications for this visit.     Review of Systems Review of Systems  Blood pressure 138/82, pulse 90, resp. rate 16, height 5\' 6"  (1.676 m), weight 223 lb (101.2 kg).  Physical Exam Physical Exam  Constitutional: He is oriented to person, place, and time. He appears well-developed and well-nourished.  Neurological: He is alert and oriented to person, place, and time.  Skin: Skin is warm and dry.  Psychiatric: He has a normal mood and affect.    Data Reviewed Previous  notes, previous right thyroid nodule FNA results.  Assessment  Previous right thyroid FNA done 04/17/17-insufficient tissue for diagnosis. Pt consented to in office US guided right thyroid nodule FNA.   Procedure: US guided right superior thyroid lobe FNA biopsy US revealed right superior lobe thyroid nodule max diameter of 1.61 cm unchanged from previous. Area was cleaned and prepped. 1cc of 1% lidocaine was infiltrated around biopsy area. Using ultrasound guidance, a 22 gauge spinal needle was inserted into the thyroid nodule and the contents were aspirated in two separate passes. Specimen was sent to cytology. A band-aid was applied to the FNA site. Pt tolerated procedure well.      Plan    Follow up to be announced based on cytology results.     HPI, Physical Exam, Assessment and Plan have been scribed under the direction and in the presence of Mckinley Jewel, MD  Concepcion Living, LPN   I have completed the exam and reviewed the above documentation for accuracy and completeness.  I agree with the above.  Haematologist has been used and any errors in dictation or transcription are unintentional.  Blen Ransome G. Jamal Collin, M.D., F.A.C.S.  Junie Panning G 06/10/2017, 10:45 AM

## 2017-06-12 ENCOUNTER — Telehealth: Payer: Self-pay | Admitting: *Deleted

## 2017-06-12 ENCOUNTER — Ambulatory Visit (INDEPENDENT_AMBULATORY_CARE_PROVIDER_SITE_OTHER): Payer: Medicare Other | Admitting: Psychology

## 2017-06-12 DIAGNOSIS — F4323 Adjustment disorder with mixed anxiety and depressed mood: Secondary | ICD-10-CM | POA: Diagnosis not present

## 2017-06-12 DIAGNOSIS — F4312 Post-traumatic stress disorder, chronic: Secondary | ICD-10-CM | POA: Diagnosis not present

## 2017-06-12 NOTE — Telephone Encounter (Signed)
Notified patient as instructed, patient pleased. Discussed follow-up appointments, (JWB) patient agrees

## 2017-06-12 NOTE — Telephone Encounter (Signed)
-----   Message from Christene Lye, MD sent at 06/12/2017  1:00 PM EST ----- Please inform pt- benign nodule. Will need follow up in 41mo

## 2017-06-18 ENCOUNTER — Ambulatory Visit: Payer: Self-pay | Admitting: Urology

## 2017-06-18 DIAGNOSIS — M0579 Rheumatoid arthritis with rheumatoid factor of multiple sites without organ or systems involvement: Secondary | ICD-10-CM | POA: Diagnosis not present

## 2017-06-19 ENCOUNTER — Ambulatory Visit (INDEPENDENT_AMBULATORY_CARE_PROVIDER_SITE_OTHER): Payer: Medicare Other | Admitting: Psychology

## 2017-06-19 DIAGNOSIS — F4323 Adjustment disorder with mixed anxiety and depressed mood: Secondary | ICD-10-CM | POA: Diagnosis not present

## 2017-06-19 DIAGNOSIS — F4312 Post-traumatic stress disorder, chronic: Secondary | ICD-10-CM | POA: Diagnosis not present

## 2017-06-19 NOTE — Progress Notes (Signed)
06/20/2017 9:28 AM   [name] [name] 21-Sep-1955 [id number]  Referring provider: Burnard Hawthorne, FNP [address] [place], [place] [address]  Chief Complaint  Patient presents with  . Elevated PSA    HPI: Patient is a 61 year old Caucasian male with BPH with LU TS who presents today to re establish care.    BPH WITH LUTS  (prostate and/or bladder) His IPSS score today is 4, which is mild lower urinary tract symptomatology. He is delighted with his quality life due to his urinary symptoms.  His major complaint today is frequency.  He has had these symptoms for several years.  He denies any dysuria, hematuria or suprapubic pain.   He currently taking tamsulosin 0.4 mg and finasteride 5 mg daily.  He also denies any recent fevers, chills, nausea or vomiting.  He does not have a family history of PCa.  IPSS    Row Name 06/20/17 0800         International Prostate Symptom Score   How often have you had the sensation of not emptying your bladder?  Less than 1 in 5     How often have you had to urinate less than every two hours?  Not at All     How often have you found you stopped and started again several times when you urinated?  Not at All     How often have you found it difficult to postpone urination?  Not at All     How often have you had a weak urinary stream?  Not at All     How often have you had to strain to start urination?  Not at All     How many times did you typically get up at night to urinate?  3 Times     Total IPSS Score  4       Quality of Life due to urinary symptoms   If you were to spend the rest of your life with your urinary condition just the way it is now how would you feel about that?  Delighted        Score:  1-7 Mild 8-19 Moderate 20-35 Severe    PMH: Past Medical History:  Diagnosis Date  . Adenomatous colon polyp   . Allergy   . Angina at rest [facility])    chronic  . Anxiety   . Arthritis   . Bell's palsy 2007  .  Carotid artery occlusion   . Cataract    Dr. Dawna Part  . Coronary artery disease   . Depression   . Diabetes mellitus   . Diverticulosis   . Duodenitis   . DVT (deep venous thrombosis) (HCC)    in leg  . Gastropathy   . Heart murmur   . Helicobacter pylori gastritis   . Hyperlipidemia   . Hypertension   . Mild aortic stenosis   . Neuropathy   . Obesity   . Peripheral vascular disease ([place])   . Post splenectomy syndrome   . Psoriasis   . Psoriatic arthritis ([place])   . Sleep apnea   . SOB (shortness of breath)   . Stroke ([place])   . Tobacco use disorder    recently quit  . Tubular adenoma 08/2013   Dr. Hilarie Fredrickson  . Weak urinary stream     Surgical History: Past Surgical History:  Procedure Laterality Date  . CARDIAC CATHETERIZATION  08/2010   LAD: 80% ISR, RCA: 80% ostial,  OM 80-90%  . CAROTID ENDARTERECTOMY  08/2010   left/ Dr. Kellie Simmering  . COLONOSCOPY    . CORONARY ANGIOPLASTY     LAD: before CABG  . CORONARY ARTERY BYPASS GRAFT  09/06/2010   At [facility]: LIMA to LAD, left radial to RCA, sequential SVG to OM3 and 4  . heart stents  Jan 2011   leg stents 06/2009 and 03/2010  . PTA of illiac and SFA  multiple   Dr. Ronalee Belts, s/p revision 11/2012  . SPLENECTOMY      Home Medications:  Allergies as of 06/20/2017      Reactions   Contrast Media [iodinated Diagnostic Agents] Rash, Other (See Comments)   Got very hot and red   Glipizide Other (See Comments)   ANTIDIABETICS. Burning   Metrizamide Other (See Comments)   Got very hot and red   Penicillins Hives, Swelling      Medication List        Accurate as of 06/20/17  9:28 AM. Always use your most recent med list.          ACCU-CHEK FASTCLIX LANCETS Misc Use to check sugar 6 times daily.   aspirin 81 MG tablet Take 81 mg by mouth daily.   BAYER CONTOUR TEST test strip Generic drug:  glucose blood TEST THREE TIMES A DAY AS DIRECTED   buPROPion 300 MG 24 hr tablet Commonly known as:  WELLBUTRIN XL Take 1  tablet (300 mg total) by mouth daily.   busPIRone 10 MG tablet Commonly known as:  BUSPAR Take 2 tablets (20 mg total) by mouth 2 (two) times daily.   carvedilol 12.5 MG tablet Commonly known as:  COREG TAKE 1 TABLET BY MOUTH TWICE DAILY   clopidogrel 75 MG tablet Commonly known as:  PLAVIX Take 1 tablet (75 mg total) by mouth daily.   cyclobenzaprine 10 MG tablet Commonly known as:  FLEXERIL take 1 tablet by mouth three times a day if needed for muscle spasm   finasteride 5 MG tablet Commonly known as:  PROSCAR Take 5 mg by mouth daily.   insulin NPH Human 100 UNIT/ML injection Commonly known as:  NOVOLIN N RELION Inject 40 units in am and 20 units in the evening   insulin regular 100 units/mL injection Commonly known as:  NOVOLIN R,HUMULIN R Inject 0.1-0.3 mLs (10-30 Units total) into the skin 3 (three) times daily before meals.   ketoconazole 2 % cream Commonly known as:  NIZORAL apply to affected area AS NEEDED FOR IRRITATION   losartan 25 MG tablet Commonly known as:  COZAAR take 1 tablet by mouth once daily   MAGNESIUM PO Take 400 mg by mouth daily.   mupirocin cream 2 % Commonly known as:  BACTROBAN Apply 1 application topically 2 (two) times daily.   nitroGLYCERIN 0.4 MG SL tablet Commonly known as:  NITROSTAT Place 1 tablet (0.4 mg total) under the tongue every 5 (five) minutes as needed.   omeprazole 20 MG capsule Commonly known as:  PRILOSEC Take 1 capsule (20 mg total) by mouth 2 (two) times daily.   PROAIR HFA 108 (90 Base) MCG/ACT inhaler Generic drug:  albuterol Inhale 2 puffs into the lungs every 6 (six) hours as needed for shortness of breath.   sertraline 100 MG tablet Commonly known as:  ZOLOFT Take 1.5 tablets (150 mg total) by mouth daily.   simvastatin 20 MG tablet Commonly known as:  ZOCOR take 1 tablet by mouth once daily AT 6PM   tamsulosin  0.4 MG Caps capsule Commonly known as:  FLOMAX take 2 capsule by mouth daily     triamcinolone ointment 0.1 % Commonly known as:  KENALOG apply to affected area twice a day AVOID FACE, GROIN AND UNDERARMS   VITAMIN D PO Take 1 capsule by mouth daily.       Allergies:  Allergies  Allergen Reactions  . Contrast Media [Iodinated Diagnostic Agents] Rash and Other (See Comments)    Got very hot and red   . Glipizide Other (See Comments)    ANTIDIABETICS. Burning  . Metrizamide Other (See Comments)    Got very hot and red  . Penicillins Hives and Swelling    Family History: Family History  Problem Relation Age of Onset  . Lung cancer Father 78  . Arthritis Father   . Breast cancer Sister 12  . Alcoholism Sister   . Dementia Mother   . Alzheimer's disease Mother   . Arthritis Mother   . Alcoholism Brother   . Epilepsy Sister   . Diabetes Paternal Grandfather   . Colon polyps Paternal Grandfather 2  . Alcoholism Sister   . Alcoholism Sister   . Alcoholism Brother     Social History:  reports that he quit smoking about 2 months ago. His smoking use included cigarettes. He has a 30.00 pack-year smoking history. he has never used smokeless tobacco. He reports that he does not drink alcohol or use drugs.  ROS: UROLOGY Frequent Urination?: Yes Hard to postpone urination?: No Burning/pain with urination?: No Get up at night to urinate?: No Leakage of urine?: No Urine stream starts and stops?: No Trouble starting stream?: No Do you have to strain to urinate?: No Blood in urine?: No Urinary tract infection?: No Sexually transmitted disease?: No Injury to kidneys or bladder?: No Painful intercourse?: No Weak stream?: No Erection problems?: No Penile pain?: No  Gastrointestinal Nausea?: No Vomiting?: No Indigestion/heartburn?: Yes Diarrhea?: No Constipation?: No  Constitutional Fever: No Night sweats?: No Weight loss?: Yes Fatigue?: No  Skin Skin rash/lesions?: No Itching?: Yes  Eyes Blurred vision?: Yes Double vision?:  No  Ears/Nose/Throat Sore throat?: No Sinus problems?: No  Hematologic/Lymphatic Swollen glands?: No Easy bruising?: Yes  Cardiovascular Leg swelling?: No Chest pain?: No  Respiratory Cough?: Yes Shortness of breath?: No  Endocrine Excessive thirst?: No  Musculoskeletal Back pain?: No Joint pain?: Yes  Neurological Headaches?: No Dizziness?: No  Psychologic Depression?: Yes Anxiety?: Yes  Physical Exam: BP (!) 188/80   Pulse 71   Ht 5\' 6"  (1.676 m)   Wt 251 lb (113.9 kg)   BMI 40.51 kg/m   Constitutional: Well nourished. Alert and oriented, No acute distress. HEENT: [place] AT, moist mucus membranes. Trachea midline, no masses. Cardiovascular: No clubbing, cyanosis, or edema. Respiratory: Normal respiratory effort, no increased work of breathing. GI: Abdomen is obese, soft, non tender, non distended, no abdominal masses. Liver and spleen not palpable.  No hernias appreciated.  Stool sample for occult testing is not indicated.   GU: No CVA tenderness.  No bladder fullness or masses.  Patient with buried phallus.  Urethral meatus is patent.  No penile discharge. No penile lesions or rashes. Scrotum without lesions, cysts, rashes and/or edema.  Testicles are located scrotally bilaterally. No masses are appreciated in the testicles. Left and right epididymis are normal. Rectal: Patient with  normal sphincter tone. Anus and perineum without scarring or rashes. No rectal masses are appreciated. Prostate is approximately 45 grams, no nodules are appreciated. Seminal  vesicles are normal. Skin: No rashes, bruises or suspicious lesions. Lymph: No cervical or inguinal adenopathy. Neurologic: Grossly intact, no focal deficits, moving all 4 extremities. Psychiatric: Normal mood and affect.  Laboratory Data: Lab Results  Component Value Date   WBC 6.4 03/15/2017   HGB 13.5 03/15/2017   HCT 41.0 03/15/2017   MCV 96.3 03/15/2017   PLT 139.0 (L) 03/15/2017    Lab Results   Component Value Date   CREATININE 2.09 (H) 03/15/2017    Lab Results  Component Value Date   PSA 0.05 (L) 03/06/2016   PSA 0.06 (L) 09/19/2015   PSA 0.18 11/23/2013     Lab Results  Component Value Date   HGBA1C 5.8 04/12/2017    Lab Results  Component Value Date   TSH 2.170 04/11/2017       Component Value Date/Time   CHOL 132 03/15/2017 1021   HDL 22.30 (L) 03/15/2017 1021   CHOLHDL 6 03/15/2017 1021   VLDL 52.8 (H) 03/15/2017 1021   LDLCALC 55 12/13/2015 0846    Lab Results  Component Value Date   AST 10 03/15/2017   Lab Results  Component Value Date   ALT 10 03/15/2017   I have reviewed the labs.    Assessment & Plan:    1. BPH with LUTS  - IPSS score is 4/0  - Continue conservative management, avoiding bladder irritants and timed voiding's  - most bothersome symptoms is/are frequency  - Continue tamsulosin 0.4 mg daily and finasteride 5 mg daily  - RTC in 12 months for IPSS, PSA and exam - if today's PSA is stable    Return in about 1 year (around 06/20/2018) for IPSS, PSA and exam.  These notes generated with voice recognition software. I apologize for typographical errors.  Zara Council, [facility] [address], [place], [place] [address] [phone]

## 2017-06-20 ENCOUNTER — Encounter: Payer: Self-pay | Admitting: Urology

## 2017-06-20 ENCOUNTER — Ambulatory Visit (INDEPENDENT_AMBULATORY_CARE_PROVIDER_SITE_OTHER): Payer: Medicare Other | Admitting: Urology

## 2017-06-20 VITALS — BP 188/80 | HR 71 | Ht 66.0 in | Wt 251.0 lb

## 2017-06-20 DIAGNOSIS — N138 Other obstructive and reflux uropathy: Secondary | ICD-10-CM

## 2017-06-20 DIAGNOSIS — N401 Enlarged prostate with lower urinary tract symptoms: Secondary | ICD-10-CM | POA: Diagnosis not present

## 2017-06-20 DIAGNOSIS — I6521 Occlusion and stenosis of right carotid artery: Secondary | ICD-10-CM

## 2017-06-21 ENCOUNTER — Telehealth: Payer: Self-pay

## 2017-06-21 LAB — PSA: Prostate Specific Ag, Serum: 0.1 ng/mL (ref 0.0–4.0)

## 2017-06-21 NOTE — Telephone Encounter (Signed)
Will send a letter

## 2017-06-21 NOTE — Telephone Encounter (Signed)
-----   Message from Nori Riis, PA-C sent at 06/21/2017  8:02 AM EST ----- Please let Mr. Sheeler know that his PSA is stable.  We will see him next year.

## 2017-06-23 ENCOUNTER — Other Ambulatory Visit: Payer: Self-pay | Admitting: Cardiovascular Disease

## 2017-06-26 ENCOUNTER — Ambulatory Visit: Payer: Medicare Other | Admitting: Psychology

## 2017-06-28 DIAGNOSIS — H2512 Age-related nuclear cataract, left eye: Secondary | ICD-10-CM | POA: Diagnosis not present

## 2017-07-02 ENCOUNTER — Other Ambulatory Visit: Payer: Self-pay

## 2017-07-03 ENCOUNTER — Ambulatory Visit (INDEPENDENT_AMBULATORY_CARE_PROVIDER_SITE_OTHER): Payer: Medicare Other | Admitting: Psychology

## 2017-07-03 DIAGNOSIS — F4323 Adjustment disorder with mixed anxiety and depressed mood: Secondary | ICD-10-CM | POA: Diagnosis not present

## 2017-07-03 DIAGNOSIS — F4312 Post-traumatic stress disorder, chronic: Secondary | ICD-10-CM

## 2017-07-04 ENCOUNTER — Encounter: Payer: Self-pay | Admitting: *Deleted

## 2017-07-08 ENCOUNTER — Ambulatory Visit: Payer: Self-pay | Admitting: Neurology

## 2017-07-09 ENCOUNTER — Encounter
Admission: RE | Disposition: A | Discharge status: Home / Self Care | Payer: Self-pay | Source: Ambulatory Visit | Attending: Ophthalmology

## 2017-07-09 ENCOUNTER — Ambulatory Visit: Payer: Medicare Other | Admitting: Anesthesiology

## 2017-07-09 ENCOUNTER — Ambulatory Visit
Admission: RE | Admit: 2017-07-09 | Discharge: 2017-07-09 | Disposition: A | Discharge status: Home / Self Care | Payer: Medicare Other | Source: Ambulatory Visit | Attending: Ophthalmology | Admitting: Ophthalmology

## 2017-07-09 DIAGNOSIS — F329 Major depressive disorder, single episode, unspecified: Secondary | ICD-10-CM | POA: Insufficient documentation

## 2017-07-09 DIAGNOSIS — Z951 Presence of aortocoronary bypass graft: Secondary | ICD-10-CM | POA: Insufficient documentation

## 2017-07-09 DIAGNOSIS — I2510 Atherosclerotic heart disease of native coronary artery without angina pectoris: Secondary | ICD-10-CM | POA: Diagnosis not present

## 2017-07-09 DIAGNOSIS — E1122 Type 2 diabetes mellitus with diabetic chronic kidney disease: Secondary | ICD-10-CM | POA: Diagnosis not present

## 2017-07-09 DIAGNOSIS — G4730 Sleep apnea, unspecified: Secondary | ICD-10-CM | POA: Insufficient documentation

## 2017-07-09 DIAGNOSIS — E119 Type 2 diabetes mellitus without complications: Secondary | ICD-10-CM | POA: Diagnosis not present

## 2017-07-09 DIAGNOSIS — H2512 Age-related nuclear cataract, left eye: Secondary | ICD-10-CM | POA: Diagnosis not present

## 2017-07-09 DIAGNOSIS — Z794 Long term (current) use of insulin: Secondary | ICD-10-CM | POA: Diagnosis not present

## 2017-07-09 DIAGNOSIS — J449 Chronic obstructive pulmonary disease, unspecified: Secondary | ICD-10-CM | POA: Diagnosis not present

## 2017-07-09 DIAGNOSIS — Z87891 Personal history of nicotine dependence: Secondary | ICD-10-CM | POA: Diagnosis not present

## 2017-07-09 DIAGNOSIS — F39 Unspecified mood [affective] disorder: Secondary | ICD-10-CM | POA: Diagnosis not present

## 2017-07-09 DIAGNOSIS — I10 Essential (primary) hypertension: Secondary | ICD-10-CM | POA: Insufficient documentation

## 2017-07-09 DIAGNOSIS — M1990 Unspecified osteoarthritis, unspecified site: Secondary | ICD-10-CM | POA: Diagnosis not present

## 2017-07-09 DIAGNOSIS — Z79899 Other long term (current) drug therapy: Secondary | ICD-10-CM | POA: Insufficient documentation

## 2017-07-09 DIAGNOSIS — I129 Hypertensive chronic kidney disease with stage 1 through stage 4 chronic kidney disease, or unspecified chronic kidney disease: Secondary | ICD-10-CM | POA: Diagnosis not present

## 2017-07-09 DIAGNOSIS — E7800 Pure hypercholesterolemia, unspecified: Secondary | ICD-10-CM | POA: Insufficient documentation

## 2017-07-09 DIAGNOSIS — E7849 Other hyperlipidemia: Secondary | ICD-10-CM | POA: Diagnosis not present

## 2017-07-09 DIAGNOSIS — N183 Chronic kidney disease, stage 3 (moderate): Secondary | ICD-10-CM | POA: Diagnosis not present

## 2017-07-09 DIAGNOSIS — F419 Anxiety disorder, unspecified: Secondary | ICD-10-CM | POA: Diagnosis not present

## 2017-07-09 HISTORY — DX: Gastro-esophageal reflux disease without esophagitis: K21.9

## 2017-07-09 HISTORY — DX: Dyspnea, unspecified: R06.00

## 2017-07-09 HISTORY — PX: CATARACT EXTRACTION W/PHACO: SHX586

## 2017-07-09 LAB — GLUCOSE, CAPILLARY: Glucose-Capillary: 172 mg/dL — ABNORMAL HIGH (ref 65–99)

## 2017-07-09 SURGERY — PHACOEMULSIFICATION, CATARACT, WITH IOL INSERTION
Anesthesia: Monitor Anesthesia Care | Site: Eye | Laterality: Left | Wound class: Clean

## 2017-07-09 MED ORDER — ARMC OPHTHALMIC DILATING DROPS
OPHTHALMIC | Status: AC
  Administered 2017-07-09: 1 via OPHTHALMIC
  Filled 2017-07-09: qty 0.4

## 2017-07-09 MED ORDER — NA CHONDROIT SULF-NA HYALURON 40-17 MG/ML IO SOLN
INTRAOCULAR | Status: AC
  Filled 2017-07-09: qty 1

## 2017-07-09 MED ORDER — POVIDONE-IODINE 5 % OP SOLN
OPHTHALMIC | Status: DC | PRN
  Administered 2017-07-09: 1 via OPHTHALMIC

## 2017-07-09 MED ORDER — EPINEPHRINE PF 1 MG/ML IJ SOLN
INTRAMUSCULAR | Status: AC
  Filled 2017-07-09: qty 1

## 2017-07-09 MED ORDER — EPINEPHRINE PF 1 MG/ML IJ SOLN
INTRAOCULAR | Status: DC | PRN
  Administered 2017-07-09: 1 mL via OPHTHALMIC

## 2017-07-09 MED ORDER — MOXIFLOXACIN HCL 0.5 % OP SOLN
OPHTHALMIC | Status: DC | PRN
  Administered 2017-07-09: .2 mL via OPHTHALMIC

## 2017-07-09 MED ORDER — FENTANYL CITRATE (PF) 100 MCG/2ML IJ SOLN
INTRAMUSCULAR | Status: AC
  Filled 2017-07-09: qty 2

## 2017-07-09 MED ORDER — SODIUM CHLORIDE 0.9 % IV SOLN
INTRAVENOUS | Status: DC
  Administered 2017-07-09: 07:00:00 via INTRAVENOUS

## 2017-07-09 MED ORDER — LIDOCAINE HCL (PF) 4 % IJ SOLN
INTRAOCULAR | Status: DC | PRN
  Administered 2017-07-09: 2 mL via OPHTHALMIC

## 2017-07-09 MED ORDER — ARMC OPHTHALMIC DILATING DROPS
1.0000 "application " | OPHTHALMIC | Status: AC
  Administered 2017-07-09 (×3): 1 via OPHTHALMIC

## 2017-07-09 MED ORDER — MOXIFLOXACIN HCL 0.5 % OP SOLN: 1.0000 [drp] | OPHTHALMIC | Status: DC | PRN

## 2017-07-09 MED ORDER — MOXIFLOXACIN HCL 0.5 % OP SOLN
OPHTHALMIC | Status: AC
  Filled 2017-07-09: qty 3

## 2017-07-09 MED ORDER — CARBACHOL 0.01 % IO SOLN
INTRAOCULAR | Status: DC | PRN
Start: 2017-07-09 — End: 2017-07-09
  Administered 2017-07-09: .5 mL via INTRAOCULAR

## 2017-07-09 MED ORDER — POVIDONE-IODINE 5 % OP SOLN
OPHTHALMIC | Status: AC
  Filled 2017-07-09: qty 30

## 2017-07-09 MED ORDER — FENTANYL CITRATE (PF) 100 MCG/2ML IJ SOLN
INTRAMUSCULAR | Status: DC | PRN
  Administered 2017-07-09: 50 ug via INTRAVENOUS
  Administered 2017-07-09 (×2): 25 ug via INTRAVENOUS

## 2017-07-09 MED ORDER — LIDOCAINE HCL (PF) 4 % IJ SOLN
INTRAMUSCULAR | Status: AC
  Filled 2017-07-09: qty 5

## 2017-07-09 MED ORDER — NA CHONDROIT SULF-NA HYALURON 40-17 MG/ML IO SOLN
INTRAOCULAR | Status: DC | PRN
  Administered 2017-07-09: 1 mL via INTRAOCULAR

## 2017-07-09 SURGICAL SUPPLY — 16 items
GLOVE BIO SURGEON STRL SZ8 (GLOVE) ×3 IMPLANT
GLOVE BIOGEL M 6.5 STRL (GLOVE) ×3 IMPLANT
GLOVE SURG LX 8.0 MICRO (GLOVE) ×2
GLOVE SURG LX STRL 8.0 MICRO (GLOVE) ×1 IMPLANT
GOWN STRL REUS W/ TWL LRG LVL3 (GOWN DISPOSABLE) ×2 IMPLANT
GOWN STRL REUS W/TWL LRG LVL3 (GOWN DISPOSABLE) ×4
LABEL CATARACT MEDS ST (LABEL) ×3 IMPLANT
LENS IOL TECNIS ITEC 20.5 (Intraocular Lens) ×3 IMPLANT
PACK CATARACT (MISCELLANEOUS) ×3 IMPLANT
PACK CATARACT BRASINGTON LX (MISCELLANEOUS) ×3 IMPLANT
PACK EYE AFTER SURG (MISCELLANEOUS) ×3 IMPLANT
SOL BSS BAG (MISCELLANEOUS) ×3
SOLUTION BSS BAG (MISCELLANEOUS) ×1 IMPLANT
SYR 5ML LL (SYRINGE) ×3 IMPLANT
WATER STERILE IRR 250ML POUR (IV SOLUTION) ×3 IMPLANT
WIPE NON LINTING 3.25X3.25 (MISCELLANEOUS) ×3 IMPLANT

## 2017-07-09 NOTE — H&P (Signed)
All labs reviewed. Abnormal studies sent to patients PCP when indicated.  Previous H&P reviewed, patient examined, there are NO CHANGES.  [name] [name] LOUIS12/18/20187:14 AM

## 2017-07-09 NOTE — Discharge Instructions (Signed)
Eye Surgery Discharge Instructions  Expect mild scratchy sensation or mild soreness. DO NOT RUB YOUR EYE!  The day of surgery:  Minimal physical activity, but bed rest is not required  No reading, computer work, or close hand work  No bending, lifting, or straining.  May watch TV  For 24 hours:  No driving, legal decisions, or alcoholic beverages  Safety precautions  Eat anything you prefer: It is better to start with liquids, then soup then solid foods.  _____ Eye patch should be worn until postoperative exam tomorrow.  ____ Solar shield eyeglasses should be worn for comfort in the sunlight/patch while sleeping  Resume all regular medications including aspirin or Coumadin if these were discontinued prior to surgery. You may shower, bathe, shave, or wash your hair. Tylenol may be taken for mild discomfort.  Call your doctor if you experience significant pain, nausea, or vomiting, fever > 101 or other signs of infection. [phone] or [phone] Specific instructions:  Follow-up Information    Birder Robson, MD Follow up.   Specialty:  Ophthalmology Why:  tomorrow at 10:20 am. Contact information: [place] [address] [phone]

## 2017-07-09 NOTE — Anesthesia Preprocedure Evaluation (Signed)
Anesthesia Evaluation  Patient identified by MRN, date of birth, ID band Patient awake    Reviewed: Allergy & Precautions, NPO status , Patient's Chart, lab work & pertinent test results  History of Anesthesia Complications Negative for: history of anesthetic complications  Airway Mallampati: III  TM Distance: >3 FB Neck ROM: Full    Dental  (+) Upper Dentures, Lower Dentures   Pulmonary sleep apnea and Continuous Positive Airway Pressure Ventilation , COPD, former smoker,    breath sounds clear to auscultation- rhonchi (-) wheezing      Cardiovascular hypertension, (-) angina+ CAD, + Cardiac Stents (stents prior to CABG) and + CABG (2012)   Rhythm:Regular Rate:Normal - Systolic murmurs and - Diastolic murmurs    Neuro/Psych  Headaches, PSYCHIATRIC DISORDERS Anxiety Depression CVA, No Residual Symptoms    GI/Hepatic GERD  ,  Endo/Other  diabetes, Insulin Dependent  Renal/GU Renal InsufficiencyRenal disease     Musculoskeletal  (+) Arthritis ,   Abdominal (+) + obese,   Peds  Hematology negative hematology ROS (+)   Anesthesia Other Findings Past Medical History: No date: Adenomatous colon polyp No date: Allergy No date: Angina at rest [facility])     Comment:  chronic No date: Anxiety No date: Arthritis     Comment:  RA 2007: Bell's palsy No date: Carotid artery occlusion No date: Cataract     Comment:  Dr. Dawna Part No date: Coronary artery disease No date: Depression No date: Diabetes mellitus No date: Diverticulosis No date: Duodenitis No date: DVT (deep venous thrombosis) (HCC)     Comment:  in leg No date: Dyspnea     Comment:  DOE No date: Gastropathy No date: GERD (gastroesophageal reflux disease) No date: Heart murmur No date: Helicobacter pylori gastritis No date: Hyperlipidemia No date: Hypertension No date: Mild aortic stenosis No date: Neuropathy No date: Obesity No date: Peripheral vascular  disease (HCC) No date: Post splenectomy syndrome No date: Psoriasis No date: Psoriatic arthritis ([place]) No date: Sleep apnea No date: SOB (shortness of breath) No date: Stroke [facility]) No date: Tobacco use disorder     Comment:  recently quit 08/2013: Tubular adenoma     Comment:  Dr. Hilarie Fredrickson No date: Weak urinary stream   Reproductive/Obstetrics                             Anesthesia Physical Anesthesia Plan  ASA: III  Anesthesia Plan: MAC   Post-op Pain Management:    Induction: Intravenous  PONV Risk Score and Plan: 1 and Midazolam  Airway Management Planned: Natural Airway  Additional Equipment:   Intra-op Plan:   Post-operative Plan:   Informed Consent: I have reviewed the patients History and Physical, chart, labs and discussed the procedure including the risks, benefits and alternatives for the proposed anesthesia with the patient or authorized representative who has indicated his/her understanding and acceptance.     Plan Discussed with: CRNA and Anesthesiologist  Anesthesia Plan Comments:         Anesthesia Quick Evaluation

## 2017-07-09 NOTE — Transfer of Care (Signed)
Immediate Anesthesia Transfer of Care Note  Patient: [name] [name]  Procedure(s) Performed: CATARACT EXTRACTION PHACO AND INTRAOCULAR LENS PLACEMENT (IOC) (Left Eye)  Patient Location: PACU  Anesthesia Type:MAC  Level of Consciousness: awake, alert  and oriented  Airway & Oxygen Therapy: Patient Spontanous Breathing  Post-op Assessment: Report given to RN and Post -op Vital signs reviewed and stable  Post vital signs: Reviewed and stable  Last Vitals:  Vitals:   07/09/17 0614  BP: (!) 150/75  Pulse: 65  Temp: 36.6 C  SpO2: 99%    Last Pain:  Vitals:   07/09/17 0614  TempSrc: Oral         Complications: No apparent anesthesia complications

## 2017-07-09 NOTE — Anesthesia Procedure Notes (Signed)
Procedure Name: MAC Performed by: Demetrius Charity, CRNA Pre-anesthesia Checklist: Patient identified, Emergency Drugs available, Patient being monitored, Suction available and Timeout performed Oxygen Delivery Method: Nasal cannula

## 2017-07-09 NOTE — Anesthesia Postprocedure Evaluation (Signed)
Anesthesia Post Note  Patient: [name] [name]  Procedure(s) Performed: CATARACT EXTRACTION PHACO AND INTRAOCULAR LENS PLACEMENT (IOC) (Left Eye)  Patient location during evaluation: PACU Anesthesia Type: MAC Level of consciousness: awake and alert and oriented Pain management: pain level controlled Vital Signs Assessment: post-procedure vital signs reviewed and stable Respiratory status: spontaneous breathing, nonlabored ventilation and respiratory function stable Cardiovascular status: blood pressure returned to baseline and stable Postop Assessment: no signs of nausea or vomiting Anesthetic complications: no     Last Vitals:  Vitals:   07/09/17 0753 07/09/17 0803  BP: (!) 187/82 (!) 173/84  Pulse: 61 60  Resp: 16 16  Temp: 36.8 C (!) 36 C  SpO2: 98% 98%    Last Pain:  Vitals:   07/09/17 0803  TempSrc: Tympanic  PainSc:                  [place]

## 2017-07-09 NOTE — Anesthesia Post-op Follow-up Note (Signed)
Anesthesia QCDR form completed.        

## 2017-07-09 NOTE — Op Note (Signed)
PREOPERATIVE DIAGNOSIS:  Nuclear sclerotic cataract of the left eye.   POSTOPERATIVE DIAGNOSIS:  Nuclear sclerotic cataract of the left eye.   OPERATIVE PROCEDURE: Procedure(s): CATARACT EXTRACTION PHACO AND INTRAOCULAR LENS PLACEMENT (IOC)   SURGEON:  Birder Robson, MD.   ANESTHESIA:  Anesthesiologist: Emmie Niemann, MD CRNA: Demetrius Charity, CRNA  1.      Managed anesthesia care. 2.     0.107ml of Shugarcaine was instilled following the paracentesis   COMPLICATIONS:  None.   TECHNIQUE:   Stop and chop   DESCRIPTION OF PROCEDURE:  The patient was examined and consented in the preoperative holding area where the aforementioned topical anesthesia was applied to the left eye and then brought back to the Operating Room where the left eye was prepped and draped in the usual sterile ophthalmic fashion and a lid speculum was placed. A paracentesis was created with the side port blade and the anterior chamber was filled with viscoelastic. A near clear corneal incision was performed with the steel keratome. A continuous curvilinear capsulorrhexis was performed with a cystotome followed by the capsulorrhexis forceps. Hydrodissection and hydrodelineation were carried out with BSS on a blunt cannula. The lens was removed in a stop and chop  technique and the remaining cortical material was removed with the irrigation-aspiration handpiece. The capsular bag was inflated with viscoelastic and the Technis [id number] lens was placed in the capsular bag without complication. The remaining viscoelastic was removed from the eye with the irrigation-aspiration handpiece. The wounds were hydrated. The anterior chamber was flushed with Miostat and the eye was inflated to physiologic pressure. 0.27ml Vigamox was placed in the anterior chamber. The wounds were found to be water tight. The eye was dressed with Vigamox. The patient was given protective glasses to wear throughout the day and a shield with which to sleep  tonight. The patient was also given drops with which to begin a drop regimen today and will follow-up with me in one day. Implant Name Type Inv. Item Serial No. Manufacturer Lot No. LRB No. Used  LENS IOL DIOP 20.5 - [id number] Intraocular Lens LENS IOL DIOP 20.5 [id number] AMO  Left 1    Procedure(s) with comments: CATARACT EXTRACTION PHACO AND INTRAOCULAR LENS PLACEMENT (IOC) (Left) - [place] 00:23 AP% 14.2 CDE 3.26 Fluid pack lot # [id number]  Electronically signed: [place] 07/09/2017 7:50 AM

## 2017-07-10 ENCOUNTER — Ambulatory Visit: Payer: Medicare Other | Admitting: Psychology

## 2017-07-12 ENCOUNTER — Other Ambulatory Visit: Payer: Self-pay

## 2017-07-12 ENCOUNTER — Ambulatory Visit (INDEPENDENT_AMBULATORY_CARE_PROVIDER_SITE_OTHER): Payer: Medicare Other

## 2017-07-12 DIAGNOSIS — I350 Nonrheumatic aortic (valve) stenosis: Secondary | ICD-10-CM | POA: Diagnosis not present

## 2017-07-17 ENCOUNTER — Other Ambulatory Visit: Payer: Self-pay | Admitting: Cardiovascular Disease

## 2017-07-17 ENCOUNTER — Ambulatory Visit: Payer: Self-pay | Admitting: Psychology

## 2017-07-24 ENCOUNTER — Ambulatory Visit: Payer: Medicare Other | Admitting: Psychology

## 2017-07-30 ENCOUNTER — Other Ambulatory Visit: Payer: Self-pay | Admitting: Internal Medicine

## 2017-07-31 ENCOUNTER — Ambulatory Visit (INDEPENDENT_AMBULATORY_CARE_PROVIDER_SITE_OTHER): Payer: Medicare Other | Admitting: Psychology

## 2017-07-31 DIAGNOSIS — F4323 Adjustment disorder with mixed anxiety and depressed mood: Secondary | ICD-10-CM | POA: Diagnosis not present

## 2017-07-31 DIAGNOSIS — F4312 Post-traumatic stress disorder, chronic: Secondary | ICD-10-CM

## 2017-07-31 DIAGNOSIS — H2511 Age-related nuclear cataract, right eye: Secondary | ICD-10-CM | POA: Diagnosis not present

## 2017-08-05 ENCOUNTER — Encounter: Payer: Self-pay | Admitting: *Deleted

## 2017-08-06 ENCOUNTER — Ambulatory Visit: Payer: Medicare Other | Admitting: Psychiatry

## 2017-08-07 ENCOUNTER — Ambulatory Visit: Payer: Medicare Other | Admitting: Psychology

## 2017-08-08 DIAGNOSIS — M0579 Rheumatoid arthritis with rheumatoid factor of multiple sites without organ or systems involvement: Secondary | ICD-10-CM | POA: Diagnosis not present

## 2017-08-12 ENCOUNTER — Encounter: Payer: Self-pay | Admitting: Internal Medicine

## 2017-08-12 ENCOUNTER — Ambulatory Visit (INDEPENDENT_AMBULATORY_CARE_PROVIDER_SITE_OTHER): Payer: Medicare Other | Admitting: Internal Medicine

## 2017-08-12 VITALS — BP 130/86 | HR 62 | Resp 16 | Ht 66.0 in | Wt 255.6 lb

## 2017-08-12 DIAGNOSIS — E041 Nontoxic single thyroid nodule: Secondary | ICD-10-CM | POA: Diagnosis not present

## 2017-08-12 DIAGNOSIS — E1151 Type 2 diabetes mellitus with diabetic peripheral angiopathy without gangrene: Secondary | ICD-10-CM

## 2017-08-12 DIAGNOSIS — Z794 Long term (current) use of insulin: Secondary | ICD-10-CM | POA: Diagnosis not present

## 2017-08-12 LAB — POCT GLYCOSYLATED HEMOGLOBIN (HGB A1C): Hemoglobin A1C: 5.9

## 2017-08-12 NOTE — Progress Notes (Signed)
Patient ID: [name] [name], male   DOB: 1956/01/31, 62 y.o.   MRN: [id number]  HPI: [name] [name] is a 62 y.o.-year-old male, returning for f/u for DM2, dx 2006, insulin-dependent since 2011, uncontrolled, with complications (CAD - CABG 2012, PVD - s/p Stents, Aortic and carotid stenosis, peripheral neuropathy). Last visit 4 mo ago.  He had cataract sx 06/2017.  He quit smoking 04/22/2017.  Last hemoglobin A1c: Lab Results  Component Value Date   HGBA1C 5.8 04/12/2017   HGBA1C 6.2 12/24/2016   HGBA1C 5.9 06/11/2016   He is on: N and R insulins: Previously on: Insulin Before breakfast Before lunch Before dinner  Regular - clear (short acting) 20 units - smaller meal 25 units - regular meal 30 units - larger meal  10 units - smaller meal 15 units - regular meal 20 units - larger meal  NPH - cloudy (long acting) 40  20  We stopped Victoza.  Now continues only on ~1 inj a week: N: 10 units + R: 10 units before a meal.  Pt checks his sugars 3-4x a day -reviewed his detailed log: - am:83-148, 158, 162 >> 87-142 >> 58, 63, 84-152 - 2h after b'fast:173 >> n/c >> 148-178 >> n/c - before lunch: 132-190, 220 >> n/c >> 165 >> 73-159, 178 - 2h after lunch: 91-155, 185 >> 114-165 >> 105-169 - before dinner:164 >> 192 >> 141 >> 126-209, 236 - 2h after dinner:112-189, 200 >> 217 >> 92-190, 241 >> 113-181, 200 - bedtime: n/c >> 130, 169 >> n/c Lowest sugar was 83 >> 87 >> 58; unclear if he has hypoglycemia awareness. Highest sugar was 226 >> 217 >> 241 >> 236.  Pt's meals are: - Breakfast: toast + eggs or cereals + milk - Lunch: soup + 1/2 sandwich - Dinner: meat + sweet potatoes or pasta + salad - Snacks: 3: sugar free foods, fruit He quit drinking sodas, + tea- artificial sweetener. + almond milk  - + CKD, last BUN/creatinine:  Lab Results  Component Value Date   BUN 29 (H) 03/15/2017   CREATININE 2.09 (H) 03/15/2017  On Cozaar. Sees nephrology. Has a stone in L kidney, has 3  cysts in R kidney. - + HL; last set of lipids: Lab Results  Component Value Date   CHOL 132 03/15/2017   HDL 22.30 (L) 03/15/2017   LDLCALC 55 12/13/2015   LDLDIRECT 61.0 03/15/2017   TRIG 264.0 (H) 03/15/2017   CHOLHDL 6 03/15/2017  On Zocor. - last eye exam was in 05/2017 >> No DR- [facility].+ cataracts - he has numbness and tingling in his feet - [place] [facility]). Dr. Elvina Mattes.  He had surgery for PVD stents on 04/27/2014. His sugars improved after the stent.   He also has a history of a thyroid nodule: 0.9 x 0.7 x 1.3 cm >> solid, hypoechoic. He had FNA 05/2017 >> benign: CONSISTENT WITH BENIGN FOLLICULAR NODULE (BETHESDA CATEGORY II).  ROS: Constitutional: no weight gain/+ weight loss, no fatigue, no subjective hyperthermia, + subjective hypothermia Eyes: no blurry vision, no xerophthalmia ENT: no sore throat, no nodules palpated in throat, no dysphagia, no odynophagia, no hoarseness Cardiovascular: no CP/+ SOB/no palpitations/+ leg swelling Respiratory: no cough/+ SOB/no wheezing Gastrointestinal: no N/no V/no D/no C/+ acid reflux Musculoskeletal: no muscle aches/+ joint aches Skin: no rashes, no hair loss Neurological: no tremors/+ numbness/+ tingling/no dizziness, + HA  I reviewed pt's medications, allergies, PMH, social hx, family hx, and changes were documented in the history  of present illness. Otherwise, unchanged from my initial visit note.  PE: BP 130/86 (BP Location: Right Arm, Patient Position: Sitting, Cuff Size: Large)   Pulse 62   Resp 16   Ht 5\' 6"  (1.676 m)   Wt 255 lb 9.6 oz (115.9 kg)   SpO2 95%   BMI 41.25 kg/m  Body mass index is 41.25 kg/m. Wt Readings from Last 3 Encounters:  08/12/17 255 lb 9.6 oz (115.9 kg)  06/20/17 251 lb (113.9 kg)  06/10/17 223 lb (101.2 kg)   Constitutional: overweight, in NAD Eyes: PERRLA, EOMI, no exophthalmos ENT: moist mucous membranes, no thyromegaly, no cervical lymphadenopathy Cardiovascular:  RRR, No RG, + 3/6 SEM Respiratory: CTA B Gastrointestinal: abdomen soft, NT, ND, BS+ Musculoskeletal: no deformities, strength intact in all 4 Skin: moist, warm, no rashes Neurological: no tremor with outstretched hands, DTR normal in all 4  ASSESSMENT: 1. DM2, insulin-dependent, now controlled, with complications - CAD - s/p CABG 2012 - PVD - s/p stents 2012 - Aortic stenosis - carotid stenosis - CKD - peripheral neuropathy  - He read Dr Janene Harvey book: Program for Reversing Diabetes".  2. R Thyroid nodule  PLAN:  1. Patient with long-standing, controlled, type 2 diabetes, now mostly diet controlled, with occasional NPH and regular insulin as needed.  His diabetes started to improve after he switched towards a more plant-based diet.  He also does not have a lot of appetite and eats small portions nowadays.  He is not drinking sodas. - His sugars are slightly higher in the last 2 weeks, especially in the second part of the day, after the holidays. - However, no medication changes -he does inject regular insulin with a larger meal or if the sugars are higher. -I strongly advised him to continue with his diet - I advised him to Patient Instructions  Please continue: N 10 units and R 10 units as needed.  Please come back for a follow-up appointment in 4 months.  - today, HbA1c is 5.9% (slightly higher but still excellent) - continue checking sugars at different times of the day - check 3x a day, rotating checks - advised for yearly eye exams >> he is UTD - Return to clinic in 3 mo with sugar log   2. R Thyroid nodule - recent dx - not very large but hypoechoic and solid - reviewed recent bx result >> benign - no further investigation needed for now as no neck compression sxs  Philemon Kingdom, MD PhD [facility] Endocrinology

## 2017-08-12 NOTE — Patient Instructions (Addendum)
Please continue: N 10 units and R 10 units as needed.  Please come back for a follow-up appointment in 4 months.

## 2017-08-13 ENCOUNTER — Other Ambulatory Visit: Payer: Self-pay

## 2017-08-13 ENCOUNTER — Encounter: Payer: Self-pay | Admitting: Psychiatry

## 2017-08-13 ENCOUNTER — Ambulatory Visit (INDEPENDENT_AMBULATORY_CARE_PROVIDER_SITE_OTHER): Payer: Medicare Other | Admitting: Psychiatry

## 2017-08-13 VITALS — BP 155/82 | HR 73 | Temp 97.9°F | Wt 253.8 lb

## 2017-08-13 DIAGNOSIS — F411 Generalized anxiety disorder: Secondary | ICD-10-CM

## 2017-08-13 DIAGNOSIS — F331 Major depressive disorder, recurrent, moderate: Secondary | ICD-10-CM | POA: Diagnosis not present

## 2017-08-13 MED ORDER — BUSPIRONE HCL 10 MG PO TABS: 20.0000 mg | ORAL_TABLET | Freq: Two times a day (BID) | ORAL | 1 refills | Status: DC

## 2017-08-13 MED ORDER — BUPROPION HCL ER (XL) 300 MG PO TB24: 300.0000 mg | ORAL_TABLET | Freq: Every day | ORAL | 1 refills | Status: DC

## 2017-08-13 MED ORDER — SERTRALINE HCL 100 MG PO TABS: 200.0000 mg | ORAL_TABLET | Freq: Every day | ORAL | 1 refills | Status: DC

## 2017-08-13 NOTE — Progress Notes (Signed)
Patient ID: [name] [name], male   DOB: Apr 28, 1956, 62 y.o.   MRN: [id number] [facility] MD/PA/NP OP Progress Note  08/13/2017 8:46 AM [name] [name]  MRN:  [id number]  Subjective:  Patient returns for follow-up of generalized anxiety disorder and depression. Patient reports that he doing quite well. States the cold hurts his joints. States his health is good overall. His HgA1C is 5.9. Fair sleep and appetite. He reports he has been taking zoloft at 200mg  for awhile now. Denies any mood symptoms.   Chief Complaint: doing well Chief Complaint    Follow-up; Medication Refill     Visit Diagnosis:     ICD-10-CM   1. GAD (generalized anxiety disorder) F41.1   2. Major depressive disorder, recurrent episode, moderate (HCC) F33.1     Past Medical History:  Past Medical History:  Diagnosis Date  . Adenomatous colon polyp   . Allergy   . Angina at rest [facility])    chronic  . Anxiety   . Arthritis    RA  . Bell's palsy 2007  . Carotid artery occlusion   . Cataract    Dr. Dawna Part  . Coronary artery disease   . Depression   . Diabetes mellitus   . Diverticulosis   . Duodenitis   . DVT (deep venous thrombosis) (HCC)    in leg  . Dyspnea    DOE  . Gastropathy   . GERD (gastroesophageal reflux disease)   . Heart murmur   . Helicobacter pylori gastritis   . Hyperlipidemia   . Hypertension   . Mild aortic stenosis   . Neuropathy   . Obesity   . Peripheral vascular disease ([place])   . Post splenectomy syndrome   . Psoriasis   . Psoriatic arthritis ([place])   . Sleep apnea   . SOB (shortness of breath)   . Stroke ([place])   . Tobacco use disorder    recently quit  . Tubular adenoma 08/2013   Dr. Hilarie Fredrickson  . Weak urinary stream     Past Surgical History:  Procedure Laterality Date  . CARDIAC CATHETERIZATION  08/2010   LAD: 80% ISR, RCA: 80% ostial, OM 80-90%  . CAROTID ENDARTERECTOMY  08/2010   left/ Dr. Kellie Simmering  . CATARACT EXTRACTION W/PHACO Left 07/09/2017   Procedure: CATARACT  EXTRACTION PHACO AND INTRAOCULAR LENS PLACEMENT (IOC);  Surgeon: Birder Robson, MD;  Location: [facility] ORS;  Service: Ophthalmology;  Laterality: Left;  [place] 00:23 AP% 14.2 CDE 3.26 Fluid pack lot # [id number]  . COLONOSCOPY    . CORONARY ANGIOPLASTY     LAD: before CABG  . CORONARY ARTERY BYPASS GRAFT  09/06/2010   At [facility]: LIMA to LAD, left radial to RCA, sequential SVG to OM3 and 4  . heart stents  Jan 2011   leg stents 06/2009 and 03/2010  . PTA of illiac and SFA  multiple   Dr. Ronalee Belts, s/p revision 11/2012  . SPLENECTOMY    . VASCULAR SURGERY     LEG STENTS   Family History:  Family History  Problem Relation Age of Onset  . Lung cancer Father 69  . Arthritis Father   . Breast cancer Sister 4  . Alcoholism Sister   . Dementia Mother   . Alzheimer's disease Mother   . Arthritis Mother   . Alcoholism Brother   . Epilepsy Sister   . Diabetes Paternal Grandfather   . Colon polyps Paternal Grandfather 32  . Alcoholism Sister   . Alcoholism Sister   .  Alcoholism Brother    Social History:  Social History   Socioeconomic History  . Marital status: Married    Spouse name: None  . Number of children: 0  . Years of education: None  . Highest education level: None  Social Needs  . Financial resource strain: None  . Food insecurity - worry: None  . Food insecurity - inability: None  . Transportation needs - medical: None  . Transportation needs - non-medical: None  Occupational History  . Occupation: Chief Operating Officer at [employer]: Disabled mostly due to neuropathy  Tobacco Use  . Smoking status: Former Smoker    Packs/day: 0.75    Years: 40.00    Pack years: 30.00    Types: Cigarettes    Last attempt to quit: 04/15/2017    Years since quitting: 0.3  . Smokeless tobacco: Never Used  . Tobacco comment: 1 cigarette a day  Substance and Sexual Activity  . Alcohol use: No  . Drug use: No  . Sexual activity: No  Other Topics Concern  . None  Social History  Narrative   Has living will   Wife is health care POA---brother Ronalee Belts is alternate   Would accept resuscitation --but no prolonged ventilation   No tube feeds if cognitively unaware   Additional History:   Assessment:   Musculoskeletal: Strength & Muscle Tone: within normal limits Gait & Station: unsteady walks with cane Patient leans: N/A  Psychiatric Specialty Exam: Medication Refill   Anxiety  Patient reports no insomnia, nervous/anxious behavior or suicidal ideas.    Depression         Associated symptoms include does not have insomnia and no suicidal ideas.  Past medical history includes anxiety.     Review of Systems  Psychiatric/Behavioral: Negative.  Negative for depression, hallucinations, memory loss, substance abuse and suicidal ideas. The patient is not nervous/anxious and does not have insomnia.   All other systems reviewed and are negative.   Blood pressure (!) 155/82, pulse 73, temperature 97.9 F (36.6 C), temperature source Oral, weight 115.1 kg (253 lb 12.8 oz).Body mass index is 40.96 kg/m.  General Appearance: Well Groomed  Eye Contact:  Good  Speech:  Normal Rate  Volume:  Normal  Mood:  Doing well  Affect:  Congruent  Thought Process:  Normal  Orientation:  Full (Time, Place, and Person)  Thought Content:  Negative  Suicidal Thoughts:  No  Homicidal Thoughts:  No  Memory:  Immediate;   Good Recent;   Good Remote;   Good  Judgement:  Good  Insight:  Good  Psychomotor Activity:  Negative  Concentration:  Good  Recall:  Good  Fund of Knowledge: Good  Language: Good  Akathisia:  Negative  Handed:  Right  AIMS (if indicated):  N/A  Assets:  Communication Skills Desire for Improvement Social Support  ADL's:  Intact  Cognition: WNL  Sleep:  Good    Is the patient at risk to self?  No. Has the patient been a risk to self in the past 6 months?  No. Has the patient been a risk to self within the distant past?  No. Is the patient a risk to  others?  No. Has the patient been a risk to others in the past 6 months?  No. Has the patient been a risk to others within the distant past?  No.  Current Medications: Current Outpatient Medications  Medication Sig Dispense Refill  . ACCU-CHEK FASTCLIX LANCETS MISC Use to  check sugar 6 times daily. 600 each 5  . albuterol (PROAIR HFA) 108 (90 Base) MCG/ACT inhaler Inhale 2 puffs into the lungs every 6 (six) hours as needed for shortness of breath.     [name] aspirin 81 MG tablet Take 81 mg by mouth daily.    [name] BAYER CONTOUR TEST test strip USE AS DIRECTED THREE TIMES DAILY 300 each 0  . buPROPion (WELLBUTRIN XL) 300 MG 24 hr tablet Take 1 tablet (300 mg total) by mouth daily. 90 tablet 1  . busPIRone (BUSPAR) 10 MG tablet Take 2 tablets (20 mg total) by mouth 2 (two) times daily. 360 tablet 1  . calcitRIOL (ROCALTROL) 0.25 MCG capsule Take 0.25 mcg by mouth 3 (three) times a week.    . carvedilol (COREG) 12.5 MG tablet TAKE 1 TABLET BY MOUTH TWICE DAILY 60 tablet 2  . Cholecalciferol (VITAMIN D PO) Take 1 capsule by mouth daily.     . clopidogrel (PLAVIX) 75 MG tablet TAKE 1 TABLET BY MOUTH ONCE DAILY 90 tablet 1  . cyclobenzaprine (FLEXERIL) 10 MG tablet take 1 tablet by mouth three times a day if needed for muscle spasm 30 tablet 0  . finasteride (PROSCAR) 5 MG tablet Take 5 mg by mouth daily.    . insulin NPH Human (NOVOLIN N RELION) 100 UNIT/ML injection Inject 40 units in am and 20 units in the evening (Patient taking differently: Inject 20 Units into the skin daily as needed (High Blood Sugar). ) 40 mL 2  . insulin regular (NOVOLIN R,HUMULIN R) 250 units/2.50mL (100 units/mL) injection Inject 0.1-0.3 mLs (10-30 Units total) into the skin 3 (three) times daily before meals. (Patient taking differently: Inject 10-20 Units into the skin See admin instructions. Sliding Scale as needed for high blood sugar 3 times daily before meals) 40 mL 5  . ketoconazole (NIZORAL) 2 % cream apply to affected area  AS NEEDED FOR IRRITATION  0  . losartan (COZAAR) 25 MG tablet take 1 tablet by mouth once daily 90 tablet 3  . MAGNESIUM PO Take 400 mg by mouth daily.     . nitroGLYCERIN (NITROSTAT) 0.4 MG SL tablet Place 1 tablet (0.4 mg total) under the tongue every 5 (five) minutes as needed. 25 tablet 1  . omeprazole (PRILOSEC) 20 MG capsule Take 1 capsule (20 mg total) by mouth 2 (two) times daily. (Patient taking differently: Take 20 mg by mouth 2 (two) times daily as needed (HEARTBURN). ) 20 capsule 0  . sertraline (ZOLOFT) 100 MG tablet Take 1.5 tablets (150 mg total) by mouth daily. (Patient taking differently: Take 200 mg by mouth daily. ) 135 tablet 1  . simvastatin (ZOCOR) 20 MG tablet take 1 tablet by mouth once daily AT 6PM 90 tablet 3  . tamsulosin (FLOMAX) 0.4 MG CAPS capsule take 2 capsule by mouth daily (Patient taking differently: Take 0.4 mg by mouth 2 (two) times daily. ) 60 capsule 11   No current facility-administered medications for this visit.     Medical Decision Making:  Established Problem, Stable/Improving (1) and Review of New Medication or Change in Dosage (2)  Treatment Plan Summary:Medication management and Plan   Depressive disorder, moderate, recurrent-  Continue Zoloft at 200mg  po daily. Continue  Wellbutrin XL 300 mg in the morning. Continue to see therapist, Dr. Cristy Folks.  Generalized anxiety disorder-stable We'll continue his BuSpar to 20 mg twice daily.    Patient will follow up in 6  months. Call before if needed  Shataya Winkles 08/13/2017, 8:46 AM

## 2017-08-14 ENCOUNTER — Ambulatory Visit (INDEPENDENT_AMBULATORY_CARE_PROVIDER_SITE_OTHER): Payer: Medicare Other | Admitting: Psychology

## 2017-08-14 DIAGNOSIS — F4323 Adjustment disorder with mixed anxiety and depressed mood: Secondary | ICD-10-CM | POA: Diagnosis not present

## 2017-08-14 DIAGNOSIS — F4312 Post-traumatic stress disorder, chronic: Secondary | ICD-10-CM

## 2017-08-21 ENCOUNTER — Ambulatory Visit: Payer: Medicare Other | Admitting: Psychology

## 2017-08-26 DIAGNOSIS — N183 Chronic kidney disease, stage 3 (moderate): Secondary | ICD-10-CM | POA: Diagnosis not present

## 2017-08-26 DIAGNOSIS — N2581 Secondary hyperparathyroidism of renal origin: Secondary | ICD-10-CM | POA: Diagnosis not present

## 2017-08-26 DIAGNOSIS — R809 Proteinuria, unspecified: Secondary | ICD-10-CM | POA: Diagnosis not present

## 2017-08-26 DIAGNOSIS — I129 Hypertensive chronic kidney disease with stage 1 through stage 4 chronic kidney disease, or unspecified chronic kidney disease: Secondary | ICD-10-CM | POA: Diagnosis not present

## 2017-08-26 DIAGNOSIS — E1122 Type 2 diabetes mellitus with diabetic chronic kidney disease: Secondary | ICD-10-CM | POA: Diagnosis not present

## 2017-08-28 ENCOUNTER — Ambulatory Visit (INDEPENDENT_AMBULATORY_CARE_PROVIDER_SITE_OTHER): Payer: Medicare Other | Admitting: Psychology

## 2017-08-28 DIAGNOSIS — E1122 Type 2 diabetes mellitus with diabetic chronic kidney disease: Secondary | ICD-10-CM | POA: Diagnosis not present

## 2017-08-28 DIAGNOSIS — F4312 Post-traumatic stress disorder, chronic: Secondary | ICD-10-CM

## 2017-08-28 DIAGNOSIS — I129 Hypertensive chronic kidney disease with stage 1 through stage 4 chronic kidney disease, or unspecified chronic kidney disease: Secondary | ICD-10-CM | POA: Diagnosis not present

## 2017-08-28 DIAGNOSIS — R809 Proteinuria, unspecified: Secondary | ICD-10-CM | POA: Diagnosis not present

## 2017-08-28 DIAGNOSIS — F4323 Adjustment disorder with mixed anxiety and depressed mood: Secondary | ICD-10-CM | POA: Diagnosis not present

## 2017-08-28 DIAGNOSIS — N183 Chronic kidney disease, stage 3 (moderate): Secondary | ICD-10-CM | POA: Diagnosis not present

## 2017-08-28 DIAGNOSIS — N2581 Secondary hyperparathyroidism of renal origin: Secondary | ICD-10-CM | POA: Diagnosis not present

## 2017-09-03 DIAGNOSIS — L851 Acquired keratosis [keratoderma] palmaris et plantaris: Secondary | ICD-10-CM | POA: Diagnosis not present

## 2017-09-03 DIAGNOSIS — B351 Tinea unguium: Secondary | ICD-10-CM | POA: Diagnosis not present

## 2017-09-03 DIAGNOSIS — E1142 Type 2 diabetes mellitus with diabetic polyneuropathy: Secondary | ICD-10-CM | POA: Diagnosis not present

## 2017-09-04 ENCOUNTER — Encounter: Payer: Self-pay | Admitting: Family

## 2017-09-04 ENCOUNTER — Telehealth: Payer: Self-pay | Admitting: Family

## 2017-09-04 ENCOUNTER — Ambulatory Visit (INDEPENDENT_AMBULATORY_CARE_PROVIDER_SITE_OTHER): Payer: Medicare Other | Admitting: Psychology

## 2017-09-04 DIAGNOSIS — I712 Thoracic aortic aneurysm, without rupture, unspecified: Secondary | ICD-10-CM | POA: Insufficient documentation

## 2017-09-04 DIAGNOSIS — F4312 Post-traumatic stress disorder, chronic: Secondary | ICD-10-CM | POA: Diagnosis not present

## 2017-09-04 DIAGNOSIS — F4323 Adjustment disorder with mixed anxiety and depressed mood: Secondary | ICD-10-CM | POA: Diagnosis not present

## 2017-09-04 DIAGNOSIS — I700 Atherosclerosis of aorta: Secondary | ICD-10-CM

## 2017-09-04 NOTE — Telephone Encounter (Signed)
Call pt  I received results from CT lung scan from [facility] done 03/2017. Noted on the exam was coronary artery atherosclerosis and three vessel disease, or often referred to as hardening of the arteries. This can put you at risk for stroke, heart attack.   The report also notes thoracic aneurysm. Are you aware of this ?  Please make a follow up appointment so we can discuss lifestyle changes and follow up of aneursym.   I would also like to ensure you have a recent lipid panel ( blood work).

## 2017-09-05 ENCOUNTER — Encounter: Payer: Self-pay | Admitting: *Deleted

## 2017-09-06 DIAGNOSIS — E875 Hyperkalemia: Secondary | ICD-10-CM | POA: Diagnosis not present

## 2017-09-06 DIAGNOSIS — B182 Chronic viral hepatitis C: Secondary | ICD-10-CM | POA: Diagnosis not present

## 2017-09-06 DIAGNOSIS — I10 Essential (primary) hypertension: Secondary | ICD-10-CM | POA: Diagnosis not present

## 2017-09-06 DIAGNOSIS — Z6841 Body Mass Index (BMI) 40.0 and over, adult: Secondary | ICD-10-CM | POA: Diagnosis not present

## 2017-09-06 DIAGNOSIS — R809 Proteinuria, unspecified: Secondary | ICD-10-CM | POA: Diagnosis not present

## 2017-09-06 DIAGNOSIS — N183 Chronic kidney disease, stage 3 (moderate): Secondary | ICD-10-CM | POA: Diagnosis not present

## 2017-09-06 DIAGNOSIS — L4050 Arthropathic psoriasis, unspecified: Secondary | ICD-10-CM | POA: Diagnosis not present

## 2017-09-06 DIAGNOSIS — L409 Psoriasis, unspecified: Secondary | ICD-10-CM | POA: Diagnosis not present

## 2017-09-06 DIAGNOSIS — E1122 Type 2 diabetes mellitus with diabetic chronic kidney disease: Secondary | ICD-10-CM | POA: Diagnosis not present

## 2017-09-06 DIAGNOSIS — M150 Primary generalized (osteo)arthritis: Secondary | ICD-10-CM | POA: Diagnosis not present

## 2017-09-06 DIAGNOSIS — M2550 Pain in unspecified joint: Secondary | ICD-10-CM | POA: Diagnosis not present

## 2017-09-06 DIAGNOSIS — M0579 Rheumatoid arthritis with rheumatoid factor of multiple sites without organ or systems involvement: Secondary | ICD-10-CM | POA: Diagnosis not present

## 2017-09-06 DIAGNOSIS — Z79899 Other long term (current) drug therapy: Secondary | ICD-10-CM | POA: Diagnosis not present

## 2017-09-06 DIAGNOSIS — D631 Anemia in chronic kidney disease: Secondary | ICD-10-CM | POA: Diagnosis not present

## 2017-09-09 NOTE — Telephone Encounter (Signed)
Patient is aware of below he is now seeing Dr [name]

## 2017-09-10 ENCOUNTER — Ambulatory Visit: Payer: Medicare Other | Admitting: Anesthesiology

## 2017-09-10 ENCOUNTER — Encounter: Payer: Self-pay | Admitting: *Deleted

## 2017-09-10 ENCOUNTER — Encounter
Admission: RE | Disposition: A | Discharge status: Home / Self Care | Payer: Self-pay | Source: Ambulatory Visit | Attending: Ophthalmology

## 2017-09-10 ENCOUNTER — Other Ambulatory Visit: Payer: Self-pay

## 2017-09-10 ENCOUNTER — Ambulatory Visit
Admission: RE | Admit: 2017-09-10 | Discharge: 2017-09-10 | Disposition: A | Discharge status: Home / Self Care | Payer: Medicare Other | Source: Ambulatory Visit | Attending: Ophthalmology | Admitting: Ophthalmology

## 2017-09-10 DIAGNOSIS — E1136 Type 2 diabetes mellitus with diabetic cataract: Secondary | ICD-10-CM | POA: Insufficient documentation

## 2017-09-10 DIAGNOSIS — N289 Disorder of kidney and ureter, unspecified: Secondary | ICD-10-CM | POA: Insufficient documentation

## 2017-09-10 DIAGNOSIS — H2511 Age-related nuclear cataract, right eye: Secondary | ICD-10-CM | POA: Diagnosis not present

## 2017-09-10 DIAGNOSIS — E1142 Type 2 diabetes mellitus with diabetic polyneuropathy: Secondary | ICD-10-CM | POA: Diagnosis not present

## 2017-09-10 DIAGNOSIS — E1151 Type 2 diabetes mellitus with diabetic peripheral angiopathy without gangrene: Secondary | ICD-10-CM | POA: Diagnosis not present

## 2017-09-10 DIAGNOSIS — Z87891 Personal history of nicotine dependence: Secondary | ICD-10-CM | POA: Insufficient documentation

## 2017-09-10 DIAGNOSIS — Z8673 Personal history of transient ischemic attack (TIA), and cerebral infarction without residual deficits: Secondary | ICD-10-CM | POA: Diagnosis not present

## 2017-09-10 DIAGNOSIS — Z9842 Cataract extraction status, left eye: Secondary | ICD-10-CM | POA: Insufficient documentation

## 2017-09-10 DIAGNOSIS — I2510 Atherosclerotic heart disease of native coronary artery without angina pectoris: Secondary | ICD-10-CM | POA: Diagnosis not present

## 2017-09-10 DIAGNOSIS — E7800 Pure hypercholesterolemia, unspecified: Secondary | ICD-10-CM | POA: Diagnosis not present

## 2017-09-10 DIAGNOSIS — K219 Gastro-esophageal reflux disease without esophagitis: Secondary | ICD-10-CM | POA: Diagnosis not present

## 2017-09-10 DIAGNOSIS — Z7902 Long term (current) use of antithrombotics/antiplatelets: Secondary | ICD-10-CM | POA: Insufficient documentation

## 2017-09-10 DIAGNOSIS — Z880 Allergy status to penicillin: Secondary | ICD-10-CM | POA: Diagnosis not present

## 2017-09-10 DIAGNOSIS — Z951 Presence of aortocoronary bypass graft: Secondary | ICD-10-CM | POA: Diagnosis not present

## 2017-09-10 DIAGNOSIS — M069 Rheumatoid arthritis, unspecified: Secondary | ICD-10-CM | POA: Insufficient documentation

## 2017-09-10 DIAGNOSIS — Z7982 Long term (current) use of aspirin: Secondary | ICD-10-CM | POA: Insufficient documentation

## 2017-09-10 DIAGNOSIS — G4730 Sleep apnea, unspecified: Secondary | ICD-10-CM | POA: Insufficient documentation

## 2017-09-10 DIAGNOSIS — I25119 Atherosclerotic heart disease of native coronary artery with unspecified angina pectoris: Secondary | ICD-10-CM | POA: Diagnosis not present

## 2017-09-10 DIAGNOSIS — Z79899 Other long term (current) drug therapy: Secondary | ICD-10-CM | POA: Diagnosis not present

## 2017-09-10 DIAGNOSIS — Z95820 Peripheral vascular angioplasty status with implants and grafts: Secondary | ICD-10-CM | POA: Insufficient documentation

## 2017-09-10 DIAGNOSIS — Z888 Allergy status to other drugs, medicaments and biological substances status: Secondary | ICD-10-CM | POA: Diagnosis not present

## 2017-09-10 DIAGNOSIS — N183 Chronic kidney disease, stage 3 (moderate): Secondary | ICD-10-CM | POA: Diagnosis not present

## 2017-09-10 DIAGNOSIS — J449 Chronic obstructive pulmonary disease, unspecified: Secondary | ICD-10-CM | POA: Diagnosis not present

## 2017-09-10 DIAGNOSIS — I129 Hypertensive chronic kidney disease with stage 1 through stage 4 chronic kidney disease, or unspecified chronic kidney disease: Secondary | ICD-10-CM | POA: Diagnosis not present

## 2017-09-10 DIAGNOSIS — E1122 Type 2 diabetes mellitus with diabetic chronic kidney disease: Secondary | ICD-10-CM | POA: Diagnosis not present

## 2017-09-10 DIAGNOSIS — Z794 Long term (current) use of insulin: Secondary | ICD-10-CM | POA: Diagnosis not present

## 2017-09-10 HISTORY — PX: CATARACT EXTRACTION W/PHACO: SHX586

## 2017-09-10 LAB — GLUCOSE, CAPILLARY: Glucose-Capillary: 107 mg/dL — ABNORMAL HIGH (ref 65–99)

## 2017-09-10 SURGERY — PHACOEMULSIFICATION, CATARACT, WITH IOL INSERTION
Anesthesia: Monitor Anesthesia Care | Site: Eye | Laterality: Right | Wound class: Clean

## 2017-09-10 MED ORDER — MOXIFLOXACIN HCL 0.5 % OP SOLN
OPHTHALMIC | Status: AC
  Filled 2017-09-10: qty 3

## 2017-09-10 MED ORDER — NA CHONDROIT SULF-NA HYALURON 40-17 MG/ML IO SOLN
INTRAOCULAR | Status: AC
  Filled 2017-09-10: qty 1

## 2017-09-10 MED ORDER — MOXIFLOXACIN HCL 0.5 % OP SOLN: 1.0000 [drp] | Freq: Once | OPHTHALMIC | Status: DC

## 2017-09-10 MED ORDER — FENTANYL CITRATE (PF) 100 MCG/2ML IJ SOLN
INTRAMUSCULAR | Status: AC
  Filled 2017-09-10: qty 2

## 2017-09-10 MED ORDER — ARMC OPHTHALMIC DILATING DROPS
OPHTHALMIC | Status: AC
Start: 2017-09-10 — End: 2017-09-10
  Administered 2017-09-10: 1 via OPHTHALMIC
  Filled 2017-09-10: qty 0.4

## 2017-09-10 MED ORDER — MOXIFLOXACIN HCL 0.5 % OP SOLN
OPHTHALMIC | Status: DC | PRN
  Administered 2017-09-10: 0.2 mL via OPHTHALMIC

## 2017-09-10 MED ORDER — POVIDONE-IODINE 5 % OP SOLN
OPHTHALMIC | Status: AC
  Filled 2017-09-10: qty 30

## 2017-09-10 MED ORDER — SODIUM CHLORIDE 0.9 % IV SOLN
INTRAVENOUS | Status: DC
  Administered 2017-09-10: 10:00:00 via INTRAVENOUS

## 2017-09-10 MED ORDER — LIDOCAINE HCL (PF) 4 % IJ SOLN
INTRAOCULAR | Status: DC | PRN
  Administered 2017-09-10: 4 mL via OPHTHALMIC

## 2017-09-10 MED ORDER — EPINEPHRINE PF 1 MG/ML IJ SOLN
INTRAOCULAR | Status: DC | PRN
  Administered 2017-09-10: 11:00:00 via OPHTHALMIC

## 2017-09-10 MED ORDER — CARBACHOL 0.01 % IO SOLN
INTRAOCULAR | Status: DC | PRN
  Administered 2017-09-10: 0.5 mL via INTRAOCULAR

## 2017-09-10 MED ORDER — ARMC OPHTHALMIC DILATING DROPS
1.0000 "application " | OPHTHALMIC | Status: AC
  Administered 2017-09-10 (×3): 1 via OPHTHALMIC

## 2017-09-10 MED ORDER — FENTANYL CITRATE (PF) 100 MCG/2ML IJ SOLN
INTRAMUSCULAR | Status: DC | PRN
  Administered 2017-09-10: 100 ug via INTRAVENOUS

## 2017-09-10 MED ORDER — LIDOCAINE HCL (PF) 4 % IJ SOLN
INTRAMUSCULAR | Status: AC
  Filled 2017-09-10: qty 5

## 2017-09-10 MED ORDER — NA CHONDROIT SULF-NA HYALURON 40-17 MG/ML IO SOLN
INTRAOCULAR | Status: DC | PRN
  Administered 2017-09-10: 1 mL via INTRAOCULAR

## 2017-09-10 MED ORDER — POVIDONE-IODINE 5 % OP SOLN
OPHTHALMIC | Status: DC | PRN
  Administered 2017-09-10: 1 via OPHTHALMIC

## 2017-09-10 MED ORDER — METOPROLOL TARTRATE 5 MG/5ML IV SOLN
INTRAVENOUS | Status: DC | PRN
  Administered 2017-09-10: 1 mg via INTRAVENOUS

## 2017-09-10 MED ORDER — EPINEPHRINE PF 1 MG/ML IJ SOLN
INTRAMUSCULAR | Status: AC
  Filled 2017-09-10: qty 2

## 2017-09-10 SURGICAL SUPPLY — 16 items
GLOVE BIO SURGEON STRL SZ8 (GLOVE) ×3 IMPLANT
GLOVE BIOGEL M 6.5 STRL (GLOVE) ×3 IMPLANT
GLOVE SURG LX 8.0 MICRO (GLOVE) ×2
GLOVE SURG LX STRL 8.0 MICRO (GLOVE) ×1 IMPLANT
GOWN STRL REUS W/ TWL LRG LVL3 (GOWN DISPOSABLE) ×2 IMPLANT
GOWN STRL REUS W/TWL LRG LVL3 (GOWN DISPOSABLE) ×4
LABEL CATARACT MEDS ST (LABEL) ×3 IMPLANT
LENS IOL TECNIS ITEC 21.0 (Intraocular Lens) ×3 IMPLANT
PACK CATARACT (MISCELLANEOUS) ×3 IMPLANT
PACK CATARACT BRASINGTON LX (MISCELLANEOUS) ×3 IMPLANT
PACK EYE AFTER SURG (MISCELLANEOUS) ×3 IMPLANT
SOL BSS BAG (MISCELLANEOUS) ×3
SOLUTION BSS BAG (MISCELLANEOUS) ×1 IMPLANT
SYR 5ML LL (SYRINGE) ×3 IMPLANT
WATER STERILE IRR 250ML POUR (IV SOLUTION) ×3 IMPLANT
WIPE NON LINTING 3.25X3.25 (MISCELLANEOUS) ×3 IMPLANT

## 2017-09-10 NOTE — Anesthesia Post-op Follow-up Note (Signed)
Anesthesia QCDR form completed.        

## 2017-09-10 NOTE — Op Note (Signed)
PREOPERATIVE DIAGNOSIS:  Nuclear sclerotic cataract of the right eye.   POSTOPERATIVE DIAGNOSIS:  NUCLEAR SCLEROTIC CATARACT RIGHT EYE   OPERATIVE PROCEDURE: Procedure(s): CATARACT EXTRACTION PHACO AND INTRAOCULAR LENS PLACEMENT (IOC)   SURGEON:  Birder Robson, MD.   ANESTHESIA:  Anesthesiologist: Alphonsus Sias, MD CRNA: Demetrius Charity, CRNA  1.      Managed anesthesia care. 2.      0.59ml of Shugarcaine was instilled in the eye following the paracentesis.   COMPLICATIONS:  None.   TECHNIQUE:   Stop and chop   DESCRIPTION OF PROCEDURE:  The patient was examined and consented in the preoperative holding area where the aforementioned topical anesthesia was applied to the right eye and then brought back to the Operating Room where the right eye was prepped and draped in the usual sterile ophthalmic fashion and a lid speculum was placed. A paracentesis was created with the side port blade and the anterior chamber was filled with viscoelastic. A near clear corneal incision was performed with the steel keratome. A continuous curvilinear capsulorrhexis was performed with a cystotome followed by the capsulorrhexis forceps. Hydrodissection and hydrodelineation were carried out with BSS on a blunt cannula. The lens was removed in a stop and chop  technique and the remaining cortical material was removed with the irrigation-aspiration handpiece. The capsular bag was inflated with viscoelastic and the Technis [id number]  lens was placed in the capsular bag without complication. The remaining viscoelastic was removed from the eye with the irrigation-aspiration handpiece. The wounds were hydrated. The anterior chamber was flushed with Miostat and the eye was inflated to physiologic pressure. 0.5ml of Vigamox was placed in the anterior chamber. The wounds were found to be water tight. The eye was dressed with Vigamox. The patient was given protective glasses to wear throughout the day and a shield with which  to sleep tonight. The patient was also given drops with which to begin a drop regimen today and will follow-up with me in one day. Implant Name Type Inv. Item Serial No. Manufacturer Lot No. LRB No. Used  LENS IOL DIOP 21.0 - [id number] Intraocular Lens LENS IOL DIOP 21.0 [id number] AMO  Right 1   Procedure(s) with comments: CATARACT EXTRACTION PHACO AND INTRAOCULAR LENS PLACEMENT (IOC) (Right) - [place] 00:26.4 AP% 17.3 CDE 4.59 Fluid Pack Lot # [id number]  Electronically signed: Birder Robson 09/10/2017 10:55 AM

## 2017-09-10 NOTE — H&P (Signed)
All labs reviewed. Abnormal studies sent to patients PCP when indicated.  Previous H&P reviewed, patient examined, there are NO CHANGES.  [name] Hush Porfilio2/19/201910:29 AM

## 2017-09-10 NOTE — Anesthesia Procedure Notes (Signed)
Procedure Name: MAC Performed by: Demetrius Charity, CRNA Pre-anesthesia Checklist: Patient identified, Emergency Drugs available, Suction available, Patient being monitored and Timeout performed Oxygen Delivery Method: Nasal cannula

## 2017-09-10 NOTE — Transfer of Care (Signed)
Immediate Anesthesia Transfer of Care Note  Patient: [name] [name]  Procedure(s) Performed: CATARACT EXTRACTION PHACO AND INTRAOCULAR LENS PLACEMENT (IOC) (Right Eye)  Patient Location: PACU  Anesthesia Type:MAC  Level of Consciousness: awake, alert  and oriented  Airway & Oxygen Therapy: Patient Spontanous Breathing  Post-op Assessment: Report given to RN and Post -op Vital signs reviewed and stable  Post vital signs: Reviewed and stable  Last Vitals:  Vitals:   09/10/17 0932  BP: (!) 153/89  Pulse: 69  Resp: 16  Temp: (!) 35.8 C  SpO2: 100%    Last Pain:  Vitals:   09/10/17 0932  TempSrc: Temporal         Complications: No apparent anesthesia complications

## 2017-09-10 NOTE — Discharge Instructions (Signed)
Follow Dr. Inda Coke postop eye drop instruction sheet as reviewed.  Eye Surgery Discharge Instructions  Expect mild scratchy sensation or mild soreness. DO NOT RUB YOUR EYE!  The day of surgery:  Minimal physical activity, but bed rest is not required  No reading, computer work, or close hand work  No bending, lifting, or straining.  May watch TV  For 24 hours:  No driving, legal decisions, or alcoholic beverages  Safety precautions  Eat anything you prefer: It is better to start with liquids, then soup then solid foods.  _____ Eye patch should be worn until postoperative exam tomorrow.  ____ Solar shield eyeglasses should be worn for comfort in the sunlight/patch while sleeping  Resume all regular medications including aspirin or Coumadin if these were discontinued prior to surgery. You may shower, bathe, shave, or wash your hair. Tylenol may be taken for mild discomfort.  Call your doctor if you experience significant pain, nausea, or vomiting, fever > 101 or other signs of infection. [phone] or [phone] Specific instructions:  Follow-up Information    Birder Robson, MD Follow up.   Specialty:  Ophthalmology Why:  09/11/17 @ 10:50 am Contact information: [address] [place] [address] [phone]

## 2017-09-10 NOTE — Anesthesia Postprocedure Evaluation (Signed)
Anesthesia Post Note  Patient: [name] [name]  Procedure(s) Performed: CATARACT EXTRACTION PHACO AND INTRAOCULAR LENS PLACEMENT (IOC) (Right Eye)  Patient location during evaluation: PACU Anesthesia Type: MAC Level of consciousness: oriented and awake and alert Pain management: satisfactory to patient Vital Signs Assessment: post-procedure vital signs reviewed and stable Respiratory status: spontaneous breathing Cardiovascular status: stable Anesthetic complications: no     Last Vitals:  Vitals:   09/10/17 0932  BP: (!) 153/89  Pulse: 69  Resp: 16  Temp: (!) 35.8 C  SpO2: 100%    Last Pain:  Vitals:   09/10/17 0932  TempSrc: Temporal                 Blima Singer

## 2017-09-10 NOTE — Anesthesia Preprocedure Evaluation (Addendum)
Anesthesia Evaluation  Patient identified by MRN, date of birth, ID band Patient awake    Reviewed: Allergy & Precautions, H&P , NPO status , reviewed documented beta blocker date and time   Airway Mallampati: III       Dental  (+) Upper Dentures, Lower Dentures   Pulmonary shortness of breath, sleep apnea and Continuous Positive Airway Pressure Ventilation , COPD, former smoker,    Pulmonary exam normal        Cardiovascular hypertension, + angina + CAD and + Peripheral Vascular Disease  Normal cardiovascular exam+ Valvular Problems/Murmurs      Neuro/Psych  Headaches, PSYCHIATRIC DISORDERS  Neuromuscular disease CVA    GI/Hepatic GERD  Controlled,  Endo/Other  diabetes  Renal/GU Renal disease     Musculoskeletal   Abdominal   Peds  Hematology   Anesthesia Other Findings Will administer intraop B Blocker   Reproductive/Obstetrics                            Anesthesia Physical Anesthesia Plan  ASA: IV  Anesthesia Plan: General and MAC   Post-op Pain Management:    Induction:   PONV Risk Score and Plan: 1 and Midazolam  Airway Management Planned:   Additional Equipment:   Intra-op Plan:   Post-operative Plan:   Informed Consent: I have reviewed the patients History and Physical, chart, labs and discussed the procedure including the risks, benefits and alternatives for the proposed anesthesia with the patient or authorized representative who has indicated his/her understanding and acceptance.   Dental Advisory Given  Plan Discussed with: CRNA  Anesthesia Plan Comments:        Anesthesia Quick Evaluation

## 2017-09-11 ENCOUNTER — Ambulatory Visit: Payer: Self-pay | Admitting: Psychology

## 2017-09-18 ENCOUNTER — Ambulatory Visit: Payer: Self-pay | Admitting: Psychology

## 2017-09-22 ENCOUNTER — Other Ambulatory Visit: Payer: Self-pay | Admitting: Cardiovascular Disease

## 2017-09-23 ENCOUNTER — Telehealth: Payer: Self-pay | Admitting: Cardiovascular Disease

## 2017-09-23 NOTE — Telephone Encounter (Signed)
-----   Message from Anselm Pancoast, [place] sent at 09/23/2017 10:42 AM EST ----- Please contact patient for a follow for March 2019 with Dr. Rockey Situ. The patient doesn't have a recall placed in the system under appointments but the office note mentioned 6 month follow up for March 2019.  Thank you Ivin Booty

## 2017-09-23 NOTE — Telephone Encounter (Signed)
Patient seeing Dr. Fletcher Anon 4/12 at 340 pm .

## 2017-09-24 DIAGNOSIS — M0579 Rheumatoid arthritis with rheumatoid factor of multiple sites without organ or systems involvement: Secondary | ICD-10-CM | POA: Diagnosis not present

## 2017-09-25 ENCOUNTER — Ambulatory Visit: Payer: Self-pay | Admitting: Psychology

## 2017-10-02 ENCOUNTER — Ambulatory Visit (INDEPENDENT_AMBULATORY_CARE_PROVIDER_SITE_OTHER): Payer: Medicare Other | Admitting: Psychology

## 2017-10-02 DIAGNOSIS — F4312 Post-traumatic stress disorder, chronic: Secondary | ICD-10-CM

## 2017-10-02 DIAGNOSIS — F4323 Adjustment disorder with mixed anxiety and depressed mood: Secondary | ICD-10-CM | POA: Diagnosis not present

## 2017-10-08 ENCOUNTER — Encounter: Payer: Self-pay | Admitting: Internal Medicine

## 2017-10-08 ENCOUNTER — Ambulatory Visit (INDEPENDENT_AMBULATORY_CARE_PROVIDER_SITE_OTHER): Payer: Medicare Other | Admitting: Internal Medicine

## 2017-10-08 VITALS — BP 122/78 | HR 69 | Temp 97.9°F | Wt 256.8 lb

## 2017-10-08 DIAGNOSIS — M7021 Olecranon bursitis, right elbow: Secondary | ICD-10-CM

## 2017-10-08 DIAGNOSIS — B372 Candidiasis of skin and nail: Secondary | ICD-10-CM | POA: Diagnosis not present

## 2017-10-08 DIAGNOSIS — J440 Chronic obstructive pulmonary disease with acute lower respiratory infection: Secondary | ICD-10-CM

## 2017-10-08 DIAGNOSIS — J209 Acute bronchitis, unspecified: Secondary | ICD-10-CM

## 2017-10-08 DIAGNOSIS — R413 Other amnesia: Secondary | ICD-10-CM | POA: Diagnosis not present

## 2017-10-08 DIAGNOSIS — D696 Thrombocytopenia, unspecified: Secondary | ICD-10-CM | POA: Diagnosis not present

## 2017-10-08 DIAGNOSIS — F331 Major depressive disorder, recurrent, moderate: Secondary | ICD-10-CM | POA: Diagnosis not present

## 2017-10-08 DIAGNOSIS — I739 Peripheral vascular disease, unspecified: Secondary | ICD-10-CM

## 2017-10-08 DIAGNOSIS — I6529 Occlusion and stenosis of unspecified carotid artery: Secondary | ICD-10-CM | POA: Diagnosis not present

## 2017-10-08 DIAGNOSIS — M7031 Other bursitis of elbow, right elbow: Secondary | ICD-10-CM | POA: Insufficient documentation

## 2017-10-08 MED ORDER — KETOCONAZOLE 2 % EX CREA: TOPICAL_CREAM | Freq: Two times a day (BID) | CUTANEOUS | 0 refills | Status: DC

## 2017-10-08 MED ORDER — ALBUTEROL SULFATE HFA 108 (90 BASE) MCG/ACT IN AERS: 2.0000 | INHALATION_SPRAY | Freq: Four times a day (QID) | RESPIRATORY_TRACT | 1 refills | Status: DC | PRN

## 2017-10-08 NOTE — Assessment & Plan Note (Signed)
Controlled on his meds---I will take over prescription

## 2017-10-08 NOTE — Assessment & Plan Note (Signed)
Did stop smoking!!

## 2017-10-08 NOTE — Assessment & Plan Note (Signed)
Likely early vascular dementia--but not that severe yet Risk reduction only for now

## 2017-10-08 NOTE — Assessment & Plan Note (Signed)
Left axilla Will try ketoconazole cream

## 2017-10-08 NOTE — Assessment & Plan Note (Signed)
Mild Slight easy bruising only

## 2017-10-08 NOTE — Assessment & Plan Note (Signed)
Likely needs stents soon

## 2017-10-08 NOTE — Progress Notes (Signed)
Subjective:    Patient ID: [name] [name], male    DOB: March 14, 1956, 62 y.o.   MRN: [id number]  HPI Here due to skin problem and discussion about his depression  Quit smoking last October! Weight up somewhat since then Feels better but still has cough Breathing better--still uses the albuterol at times  Has been seeing psychiatrist (Dr Einar Grad) for depression Interested in having me prescribe the medications Weekly therapy with Dr Rexene Edison Chronic recurrent major depression  Has intermittent rash in left axilla Itchy when it is there Has tried anti itch cream--- doesn't really help  Also has red swelling over left elbow For about 2 months Better now but persists  Some memory issues Will be talking and loses his train of thought Doesn't get lost in the car, hasn't given up any actiivities Known vascular changes, carotid disease, etc Had seen Dr Tat in past--was on topamax but doesn't anymore If he gets migraine--will take aspirin and sit in dark room  Ongoing leg pain May be getting stents in legs soon ([place])  No abnormal bleeding Bruises easily on his blood thinners   Current Outpatient Medications on File Prior to Visit  Medication Sig Dispense Refill  . ACCU-CHEK FASTCLIX LANCETS MISC Use to check sugar 6 times daily. 600 each 5  . albuterol (PROAIR HFA) 108 (90 Base) MCG/ACT inhaler Inhale 2 puffs into the lungs every 6 (six) hours as needed for shortness of breath.     [name] aspirin 81 MG tablet Take 81 mg by mouth daily.    [name] BAYER CONTOUR TEST test strip USE AS DIRECTED THREE TIMES DAILY 300 each 0  . buPROPion (WELLBUTRIN XL) 300 MG 24 hr tablet Take 1 tablet (300 mg total) by mouth daily. 90 tablet 1  . busPIRone (BUSPAR) 10 MG tablet Take 2 tablets (20 mg total) by mouth 2 (two) times daily. 360 tablet 1  . calcitRIOL (ROCALTROL) 0.25 MCG capsule Take 0.25 mcg by mouth 3 (three) times a week.    . carvedilol (COREG) 12.5 MG tablet TAKE 1 TABLET BY MOUTH TWICE  DAILY 60 tablet 0  . Cholecalciferol (VITAMIN D PO) Take 1 capsule by mouth daily.     . clopidogrel (PLAVIX) 75 MG tablet TAKE 1 TABLET BY MOUTH ONCE DAILY 90 tablet 1  . cyclobenzaprine (FLEXERIL) 10 MG tablet take 1 tablet by mouth three times a day if needed for muscle spasm 30 tablet 0  . finasteride (PROSCAR) 5 MG tablet Take 5 mg by mouth daily.    . InFLIXimab (REMICADE IV) Inject into the vein every 6 (six) weeks.    . insulin NPH Human (NOVOLIN N RELION) 100 UNIT/ML injection Inject 40 units in am and 20 units in the evening (Patient taking differently: Inject 20 Units into the skin daily as needed (High Blood Sugar). ) 40 mL 2  . insulin regular (NOVOLIN R,HUMULIN R) 250 units/2.84mL (100 units/mL) injection Inject 0.1-0.3 mLs (10-30 Units total) into the skin 3 (three) times daily before meals. (Patient taking differently: Inject 10-30 Units into the skin 3 (three) times daily before meals. Sliding Scale as needed for high blood sugar 3 times daily before meals) 40 mL 5  . ketoconazole (NIZORAL) 2 % cream apply to affected area AS NEEDED FOR IRRITATION  0  . losartan (COZAAR) 25 MG tablet take 1 tablet by mouth once daily 90 tablet 3  . losartan (COZAAR) 50 MG tablet Take 50 mg by mouth daily.    [name] MAGNESIUM  PO Take 400 mg by mouth daily.     . nitroGLYCERIN (NITROSTAT) 0.4 MG SL tablet Place 1 tablet (0.4 mg total) under the tongue every 5 (five) minutes as needed. 25 tablet 1  . omeprazole (PRILOSEC) 20 MG capsule Take 1 capsule (20 mg total) by mouth 2 (two) times daily. (Patient taking differently: Take 20 mg by mouth 2 (two) times daily as needed (HEARTBURN). ) 20 capsule 0  . sertraline (ZOLOFT) 100 MG tablet Take 2 tablets (200 mg total) by mouth daily. (Patient taking differently: Take 150 mg by mouth daily. ) 180 tablet 1  . simvastatin (ZOCOR) 20 MG tablet take 1 tablet by mouth once daily AT 6PM 90 tablet 3  . tamsulosin (FLOMAX) 0.4 MG CAPS capsule take 2 capsule by mouth daily  (Patient taking differently: Take 0.4 mg by mouth 2 (two) times daily. ) 60 capsule 11   No current facility-administered medications on file prior to visit.     Allergies  Allergen Reactions  . Contrast Media [Iodinated Diagnostic Agents] Rash and Other (See Comments)    Got very hot and red   . Glipizide Other (See Comments)    ANTIDIABETICS. Burning  . Metrizamide Other (See Comments)    Got very hot and red  . Penicillins Hives and Swelling    Has patient had a PCN reaction causing immediate rash, facial/tongue/throat swelling, SOB or lightheadedness with hypotension: yes Has patient had a PCN reaction causing severe rash involving mucus membranes or skin necrosis: no  Has patient had a PCN reaction that required hospitalization: yes Has patient had a PCN reaction occurring within the last 10 years: no If all of the above answers are "NO", then may proceed with Cephalosporin use.     Past Medical History:  Diagnosis Date  . Adenomatous colon polyp   . Allergy   . Angina at rest [facility])    chronic  . Anxiety   . Arthritis    RA  . Bell's palsy 2007  . Carotid artery occlusion   . Cataract    Dr. Dawna Part  . Coronary artery disease   . Depression   . Diabetes mellitus   . Diverticulosis   . Duodenitis   . DVT (deep venous thrombosis) (HCC)    in leg  . Dyspnea    DOE  . Gastropathy   . GERD (gastroesophageal reflux disease)   . Heart murmur   . Helicobacter pylori gastritis   . Hyperlipidemia   . Hypertension   . Mild aortic stenosis   . Neuropathy   . Obesity   . Peripheral vascular disease ([employer])   . Post splenectomy syndrome   . Psoriasis   . Psoriatic arthritis ([place])   . Sleep apnea    uses cpap  . SOB (shortness of breath)   . Stroke ([place])   . Tobacco use disorder    recently quit  . Tubular adenoma 08/2013   Dr. Hilarie Fredrickson  . Weak urinary stream     Past Surgical History:  Procedure Laterality Date  . CARDIAC CATHETERIZATION  08/2010   LAD: 80%  ISR, RCA: 80% ostial, OM 80-90%  . CAROTID ENDARTERECTOMY  08/2010   left/ Dr. Kellie Simmering  . CATARACT EXTRACTION W/PHACO Left 07/09/2017   Procedure: CATARACT EXTRACTION PHACO AND INTRAOCULAR LENS PLACEMENT (IOC);  Surgeon: Birder Robson, MD;  Location: [facility] ORS;  Service: Ophthalmology;  Laterality: Left;  [place] 00:23 AP% 14.2 CDE 3.26 Fluid pack lot # [id number]  . CATARACT EXTRACTION  W/PHACO Right 09/10/2017   Procedure: CATARACT EXTRACTION PHACO AND INTRAOCULAR LENS PLACEMENT (IOC);  Surgeon: Birder Robson, MD;  Location: [facility] ORS;  Service: Ophthalmology;  Laterality: Right;  [place] 00:26.4 AP% 17.3 CDE 4.59 Fluid Pack Lot # [id number]  . COLONOSCOPY    . CORONARY ANGIOPLASTY     LAD: before CABG  . CORONARY ARTERY BYPASS GRAFT  09/06/2010   At [facility]: LIMA to LAD, left radial to RCA, sequential SVG to OM3 and 4  . heart stents  Jan 2011   leg stents 06/2009 and 03/2010  . PTA of illiac and SFA  multiple   Dr. Ronalee Belts, s/p revision 11/2012  . SPLENECTOMY    . VASCULAR SURGERY     LEG STENTS    Family History  Problem Relation Age of Onset  . Lung cancer Father 44  . Arthritis Father   . Breast cancer Sister 50  . Alcoholism Sister   . Dementia Mother   . Alzheimer's disease Mother   . Arthritis Mother   . Alcoholism Brother   . Epilepsy Sister   . Diabetes Paternal Grandfather   . Colon polyps Paternal Grandfather 64  . Alcoholism Sister   . Alcoholism Sister   . Alcoholism Brother     Social History   Socioeconomic History  . Marital status: Married    Spouse name: Not on file  . Number of children: 0  . Years of education: Not on file  . Highest education level: Not on file  Social Needs  . Financial resource strain: Not on file  . Food insecurity - worry: Not on file  . Food insecurity - inability: Not on file  . Transportation needs - medical: Not on file  . Transportation needs - non-medical: Not on file  Occupational History  . Occupation: Chief Operating Officer at [employer]: Disabled mostly due to neuropathy  Tobacco Use  . Smoking status: Former Smoker    Packs/day: 0.75    Years: 40.00    Pack years: 30.00    Types: Cigarettes    Last attempt to quit: 04/15/2017    Years since quitting: 0.4  . Smokeless tobacco: Never Used  . Tobacco comment: 1 cigarette a day  Substance and Sexual Activity  . Alcohol use: No  . Drug use: No  . Sexual activity: No  Other Topics Concern  . Not on file  Social History Narrative   Has living will   Wife is health care POA---brother Ronalee Belts is alternate   Would accept resuscitation --but no prolonged ventilation   No tube feeds if cognitively unaware   Review of Systems Sleeps well--still uses CPAP (now down to 14) Appetite is fine--weight stable    Objective:   Physical Exam  Musculoskeletal:  Mild swelling, induration of bursa at right elbow  Skin:  Mild redness in left axilla          Assessment & Plan:

## 2017-10-08 NOTE — Assessment & Plan Note (Signed)
Resolving now Discussed observation only--will take a while

## 2017-10-08 NOTE — Assessment & Plan Note (Signed)
Monitored yearly for now On secondary prevention--ASA, plavix, statin

## 2017-10-09 ENCOUNTER — Ambulatory Visit (INDEPENDENT_AMBULATORY_CARE_PROVIDER_SITE_OTHER): Payer: Medicare Other | Admitting: Psychology

## 2017-10-09 DIAGNOSIS — F4323 Adjustment disorder with mixed anxiety and depressed mood: Secondary | ICD-10-CM | POA: Diagnosis not present

## 2017-10-09 DIAGNOSIS — F4312 Post-traumatic stress disorder, chronic: Secondary | ICD-10-CM | POA: Diagnosis not present

## 2017-10-15 ENCOUNTER — Ambulatory Visit (INDEPENDENT_AMBULATORY_CARE_PROVIDER_SITE_OTHER): Payer: Medicare Other

## 2017-10-15 ENCOUNTER — Ambulatory Visit (INDEPENDENT_AMBULATORY_CARE_PROVIDER_SITE_OTHER): Payer: Medicare Other | Admitting: Vascular Surgery

## 2017-10-15 ENCOUNTER — Encounter (INDEPENDENT_AMBULATORY_CARE_PROVIDER_SITE_OTHER): Payer: Self-pay | Admitting: Vascular Surgery

## 2017-10-15 VITALS — BP 136/79 | HR 69 | Resp 16 | Ht 70.0 in | Wt 256.0 lb

## 2017-10-15 DIAGNOSIS — I10 Essential (primary) hypertension: Secondary | ICD-10-CM | POA: Diagnosis not present

## 2017-10-15 DIAGNOSIS — I739 Peripheral vascular disease, unspecified: Secondary | ICD-10-CM | POA: Diagnosis not present

## 2017-10-15 DIAGNOSIS — I6529 Occlusion and stenosis of unspecified carotid artery: Secondary | ICD-10-CM | POA: Diagnosis not present

## 2017-10-15 DIAGNOSIS — Z794 Long term (current) use of insulin: Secondary | ICD-10-CM | POA: Diagnosis not present

## 2017-10-15 DIAGNOSIS — E1151 Type 2 diabetes mellitus with diabetic peripheral angiopathy without gangrene: Secondary | ICD-10-CM | POA: Diagnosis not present

## 2017-10-15 NOTE — Progress Notes (Signed)
MRN : [id number]  [name] [name] is a 62 y.o. (April 14, 1956) male who presents with chief complaint of  Chief Complaint  Patient presents with  . Follow-up    6 month ABI and Arterial  .  History of Present Illness: Patient returns today in follow up of PAD.  He is doing reasonably well.  He has stopped smoking and is congratulated for this.  His legs have a lot of neuropathic pain.  No new ulcerations or infection.  Noninvasive studies today show 75-99% stenosis in the left common femoral artery and proximal SFA.  His left ABI is still well preserved at 0.82 and his right ABI remains in the normal range at 1.         Current Outpatient Prescriptions  Medication Sig Dispense Refill  . ACCU-CHEK FASTCLIX LANCETS MISC Use to check sugar 6 times daily. 600 each 5  . albuterol (PROAIR HFA) 108 (90 Base) MCG/ACT inhaler Inhale 2 puffs into the lungs every 6 (six) hours as needed for shortness of breath.     [name] aspirin 81 MG tablet Take 81 mg by mouth daily.    [name] BAYER CONTOUR TEST test strip TEST THREE TIMES A DAY AS DIRECTED 300 each 0  . buPROPion (WELLBUTRIN XL) 300 MG 24 hr tablet Take 1 tablet (300 mg total) by mouth daily. 30 tablet 2  . busPIRone (BUSPAR) 10 MG tablet Take 2 tablets (20 mg total) by mouth 2 (two) times daily. 120 tablet 4  . carvedilol (COREG) 12.5 MG tablet TAKE 1 TABLET BY MOUTH TWICE DAILY 60 tablet 2  . Cholecalciferol (VITAMIN D PO) Take by mouth.    . clopidogrel (PLAVIX) 75 MG tablet Take 1 tablet (75 mg total) by mouth daily. 90 tablet 3  . cyclobenzaprine (FLEXERIL) 10 MG tablet take 1 tablet by mouth three times a day if needed for muscle spasm 30 tablet 0  . insulin NPH Human (NOVOLIN N RELION) 100 UNIT/ML injection Inject 40 units in am and 20 units in the evening 40 mL 2  . insulin regular (NOVOLIN R,HUMULIN R) 250 units/2.2mL (100 units/mL) injection Inject 0.1-0.3 mLs (10-30 Units total) into the skin 3 (three) times daily before meals. 40 mL 5  .  ketoconazole (NIZORAL) 2 % cream apply to affected area AS NEEDED FOR IRRITATION  0  . losartan (COZAAR) 25 MG tablet take 1 tablet by mouth once daily 90 tablet 3  . MAGNESIUM PO Take by mouth.    . mupirocin cream (BACTROBAN) 2 % Apply 1 application topically 2 (two) times daily. 15 g 0  . nitroGLYCERIN (NITROSTAT) 0.4 MG SL tablet Place 1 tablet (0.4 mg total) under the tongue every 5 (five) minutes as needed. 25 tablet 1  . NOVOLIN N RELION 100 UNIT/ML injection INJECT 40 UNITS IN THE MORNING AND 20 UNITS IN THE EVENING 40 mL 2  . omeprazole (PRILOSEC) 20 MG capsule Take 1 capsule (20 mg total) by mouth 2 (two) times daily. (Patient taking differently: Take 20 mg by mouth 2 (two) times daily as needed (HEARTBURN). ) 20 capsule 0  . sertraline (ZOLOFT) 100 MG tablet Take 1.5 tablets (150 mg total) by mouth daily. 45 tablet 2  . simvastatin (ZOCOR) 20 MG tablet take 1 tablet by mouth once daily AT 6PM 90 tablet 3  . tamsulosin (FLOMAX) 0.4 MG CAPS capsule take 2 capsule by mouth daily 60 capsule 11  . topiramate (TOPAMAX) 100 MG tablet Take 1 tablet (100 mg  total) by mouth 2 (two) times daily. 60 tablet 5  . triamcinolone ointment (KENALOG) 0.1 % apply to affected area twice a day AVOID FACE, GROIN AND UNDERARMS  0   No current facility-administered medications for this visit.         Past Medical History:  Diagnosis Date  . Adenomatous colon polyp   . Allergy   . Angina at rest [facility])    chronic  . Anxiety   . Arthritis   . Bell's palsy 2007  . Carotid artery occlusion   . Cataract    Dr. Dawna Part  . Coronary artery disease   . Depression   . Diabetes mellitus   . Diverticulosis   . Duodenitis   . DVT (deep venous thrombosis) (HCC)    in leg  . Gastropathy   . Heart murmur   . Helicobacter pylori gastritis   . Hyperlipidemia   . Hypertension   . Mild aortic stenosis   . Neuropathy   . Obesity   . Peripheral vascular disease ([place])   . Post  splenectomy syndrome   . Psoriasis   . Psoriatic arthritis ([place])   . Sleep apnea   . SOB (shortness of breath)   . Stroke ([place])   . Tobacco use disorder    recently quit  . Tubular adenoma 08/2013   Dr. Hilarie Fredrickson  . Weak urinary stream          Past Surgical History:  Procedure Laterality Date  . CARDIAC CATHETERIZATION  08/2010   LAD: 80% ISR, RCA: 80% ostial, OM 80-90%  . CAROTID ENDARTERECTOMY  08/2010   left/ Dr. Kellie Simmering  . COLONOSCOPY    . CORONARY ANGIOPLASTY     LAD: before CABG  . CORONARY ARTERY BYPASS GRAFT  09/06/2010   At [facility]: LIMA to LAD, left radial to RCA, sequential SVG to OM3 and 4  . heart stents  Jan 2011   leg stents 06/2009 and 03/2010  . PTA of illiac and SFA  multiple   Dr. Ronalee Belts, s/p revision 11/2012  . SPLENECTOMY      Social History       Social History  Substance Use Topics  . Smoking status: Light Tobacco Smoker    Packs/day: 0.75    Years: 40.00    Types: Cigarettes  . Smokeless tobacco: Never Used     Comment: 1 cigarette a day  . Alcohol use No    Family History      Family History  Problem Relation Age of Onset  . Lung cancer Father 31  . Arthritis Father   . Breast cancer Sister 19  . Alcoholism Sister   . Dementia Mother   . Alzheimer's disease Mother   . Arthritis Mother   . Alcoholism Brother   . Epilepsy Sister   . Diabetes Paternal Grandfather   . Colon polyps Paternal Grandfather 64  . Alcoholism Sister   . Alcoholism Sister   . Alcoholism Brother           Allergies  Allergen Reactions  . Contrast Media [Iodinated Diagnostic Agents] Rash and Other (See Comments)    Got very hot and red   . Glipizide Other (See Comments)    ANTIDIABETICS. Burning  . Metrizamide Other (See Comments)    Got very hot and red  . Penicillins Hives and Swelling     REVIEW OF SYSTEMS (Negative unless checked)  Constitutional: [] Weight loss  [] Fever   [] Chills Cardiac: [] Chest pain   [] Chest  pressure   [] Palpitations   [] Shortness of breath when laying flat   [] Shortness of breath at rest   [] Shortness of breath with exertion. Vascular:  [] Pain in legs with walking   [] Pain in legs at rest   [] Pain in legs when laying flat   [x] Claudication   [] Pain in feet when walking  [x] Pain in feet at rest  [] Pain in feet when laying flat   [] History of DVT   [] Phlebitis   [x] Swelling in legs   [] Varicose veins   [] Non-healing ulcers Pulmonary:   [] Uses home oxygen   [] Productive cough   [] Hemoptysis   [] Wheeze  [] COPD   [] Asthma Neurologic:  [] Dizziness  [] Blackouts   [] Seizures   [] History of stroke   [] History of TIA  [] Aphasia   [] Temporary blindness   [] Dysphagia   [] Weakness or numbness in arms   [] Weakness or numbness in legs Musculoskeletal:  [x] Arthritis   [] Joint swelling   [] Joint pain   [] Low back pain Hematologic:  [] Easy bruising  [] Easy bleeding   [] Hypercoagulable state   [] Anemic   Gastrointestinal:  [] Blood in stool   [] Vomiting blood  [] Gastroesophageal reflux/heartburn   [] Abdominal pain Genitourinary:  [] Chronic kidney disease   [] Difficult urination  [] Frequent urination  [] Burning with urination   [] Hematuria Skin:  [] Rashes   [] Ulcers   [] Wounds Psychological:  [] History of anxiety   []  History of major depression.      Physical Examination  BP 136/79 (BP Location: Right Arm, Patient Position: Sitting)   Pulse 69   Resp 16   Ht 5\' 10"  (1.778 m)   Wt 256 lb (116.1 kg)   BMI 36.73 kg/m  Gen:  WD/WN, NAD.  Obese Head: [place]/AT, No temporalis wasting. Ear/Nose/Throat: Hearing grossly intact, nares w/o erythema or drainage, trachea midline Eyes: Conjunctiva clear. Sclera non-icteric Neck: Supple.  No JVD.  Pulmonary:  Good air movement, no use of accessory muscles.  Cardiac: RRR Vascular:  Vessel Right Left  Radial Palpable Palpable                          PT  1+ palpable  trace palpable  DP Palpable  1+ palpable     Musculoskeletal: M/S 5/5 throughout.  No deformity or atrophy.  Trace lower extremity edema. Neurologic: Sensation grossly intact in extremities.  Symmetrical.  Speech is fluent.  Psychiatric: Judgment intact, Mood & affect appropriate for pt's clinical situation. Dermatologic: No rashes or ulcers noted.  No cellulitis or open wounds.       Labs Recent Results (from the past 2160 hour(s))  POCT HgB A1C     Status: None   Collection Time: 08/12/17  9:27 AM  Result Value Ref Range   Hemoglobin A1C 5.9   Glucose, capillary     Status: Abnormal   Collection Time: 09/10/17  9:35 AM  Result Value Ref Range   Glucose-Capillary 107 (H) 65 - 99 mg/dL    Radiology No results found.    Assessment/Plan Essential hypertension blood pressure control important in reducing the progression of atherosclerotic disease. On appropriate oral medications.   Type 2 diabetes mellitus with diabetic peripheral angiopathy without gangrene (HCC) blood glucose control important in reducing the progression of atherosclerotic disease. Also, involved in wound healing. On appropriate medications.    PVD (peripheral vascular disease) ([place]) Noninvasive studies today show 75-99% stenosis in the left common femoral artery and proximal SFA.  His left ABI is still well preserved at  0.82 and his right ABI remains in the normal range at 1. No intervention currently necessary. Continue current medical regimen. Recheck in 1 year    Leotis Pain, MD  10/15/2017 12:32 PM    This note was created with Dragon medical transcription system.  Any errors from dictation are purely unintentional

## 2017-10-15 NOTE — Patient Instructions (Signed)
Peripheral Vascular Disease Peripheral vascular disease (PVD) is a disease of the blood vessels that are not part of your heart and brain. A simple term for PVD is poor circulation. In most cases, PVD narrows the blood vessels that carry blood from your heart to the rest of your body. This can result in a decreased supply of blood to your arms, legs, and internal organs, like your stomach or kidneys. However, it most often affects a person's lower legs and feet. There are two types of PVD.  Organic PVD. This is the more common type. It is caused by damage to the structure of blood vessels.  Functional PVD. This is caused by conditions that make blood vessels contract and tighten (spasm).  Without treatment, PVD tends to get worse over time. PVD can also lead to acute ischemic limb. This is when an arm or limb suddenly has trouble getting enough blood. This is a medical emergency. What are the causes? Each type of PVD has many different causes. The most common cause of PVD is buildup of a fatty material (plaque) inside of your arteries (atherosclerosis). Small amounts of plaque can break off from the walls of the blood vessels and become lodged in a smaller artery. This blocks blood flow and can cause acute ischemic limb. Other common causes of PVD include:  Blood clots that form inside of blood vessels.  Injuries to blood vessels.  Diseases that cause inflammation of blood vessels or cause blood vessel spasms.  Health behaviors and health history that increase your risk of developing PVD.  What increases the risk? You may have a greater risk of PVD if you:  Have a family history of PVD.  Have certain medical conditions, including: ? High cholesterol. ? Diabetes. ? High blood pressure (hypertension). ? Coronary heart disease. ? Past problems with blood clots. ? Past injury, such as burns or a broken bone. These may have damaged blood vessels in your limbs. ? Buerger disease. This is  caused by inflamed blood vessels in your hands and feet. ? Some forms of arthritis. ? Rare birth defects that affect the arteries in your legs.  Use tobacco.  Do not get enough exercise.  Are obese.  Are age 50 or older.  What are the signs or symptoms? PVD may cause many different symptoms. Your symptoms depend on what part of your body is not getting enough blood. Some common signs and symptoms include:  Cramps in your lower legs. This may be a symptom of poor leg circulation (claudication).  Pain and weakness in your legs while you are physically active that goes away when you rest (intermittent claudication).  Leg pain when at rest.  Leg numbness, tingling, or weakness.  Coldness in a leg or foot, especially when compared with the other leg.  Skin or hair changes. These can include: ? Hair loss. ? Shiny skin. ? Pale or bluish skin. ? Thick toenails.  Inability to get or maintain an erection (erectile dysfunction).  People with PVD are more prone to developing ulcers and sores on their toes, feet, or legs. These may take longer than normal to heal. How is this diagnosed? Your health care provider may diagnose PVD from your signs and symptoms. The health care provider will also do a physical exam. You may have tests to find out what is causing your PVD and determine its severity. Tests may include:  Blood pressure recordings from your arms and legs and measurements of the strength of your pulses (  pulse volume recordings).  Imaging studies using sound waves to take pictures of the blood flow through your blood vessels (Doppler ultrasound).  Injecting a dye into your blood vessels before having imaging studies using: ? X-rays (angiogram or arteriogram). ? Computer-generated X-rays (CT angiogram). ? A powerful electromagnetic field and a computer (magnetic resonance angiogram or MRA).  How is this treated? Treatment for PVD depends on the cause of your condition and the  severity of your symptoms. It also depends on your age. Underlying causes need to be treated and controlled. These include long-lasting (chronic) conditions, such as diabetes, high cholesterol, and high blood pressure. You may need to first try making lifestyle changes and taking medicines. Surgery may be needed if these do not work. Lifestyle changes may include:  Quitting smoking.  Exercising regularly.  Following a low-fat, low-cholesterol diet.  Medicines may include:  Blood thinners to prevent blood clots.  Medicines to improve blood flow.  Medicines to improve your blood cholesterol levels.  Surgical procedures may include:  A procedure that uses an inflated balloon to open a blocked artery and improve blood flow (angioplasty).  A procedure to put in a tube (stent) to keep a blocked artery open (stent implant).  Surgery to reroute blood flow around a blocked artery (peripheral bypass surgery).  Surgery to remove dead tissue from an infected wound on the affected limb.  Amputation. This is surgical removal of the affected limb. This may be necessary in cases of acute ischemic limb that are not improved through medical or surgical treatments.  Follow these instructions at home:  Take medicines only as directed by your health care provider.  Do not use any tobacco products, including cigarettes, chewing tobacco, or electronic cigarettes. If you need help quitting, ask your health care provider.  Lose weight if you are overweight, and maintain a healthy weight as directed by your health care provider.  Eat a diet that is low in fat and cholesterol. If you need help, ask your health care provider.  Exercise regularly. Ask your health care provider to suggest some good activities for you.  Use compression stockings or other mechanical devices as directed by your health care provider.  Take good care of your feet. ? Wear comfortable shoes that fit well. ? Check your feet  often for any cuts or sores. Contact a health care provider if:  You have cramps in your legs while walking.  You have leg pain when you are at rest.  You have coldness in a leg or foot.  Your skin changes.  You have erectile dysfunction.  You have cuts or sores on your feet that are not healing. Get help right away if:  Your arm or leg turns cold and blue.  Your arms or legs become red, warm, swollen, painful, or numb.  You have chest pain or trouble breathing.  You suddenly have weakness in your face, arm, or leg.  You become very confused or lose the ability to speak.  You suddenly have a very bad headache or lose your vision. This information is not intended to replace advice given to you by your health care provider. Make sure you discuss any questions you have with your health care provider. Document Released: 08/16/2004 Document Revised: 12/15/2015 Document Reviewed: 12/17/2013 Elsevier Interactive Patient Education  2017 Elsevier Inc.  

## 2017-10-15 NOTE — Assessment & Plan Note (Signed)
Noninvasive studies today show 75-99% stenosis in the left common femoral artery and proximal SFA.  His left ABI is still well preserved at 0.82 and his right ABI remains in the normal range at 1. No intervention currently necessary. Continue current medical regimen. Recheck in 1 year

## 2017-10-16 ENCOUNTER — Ambulatory Visit: Payer: Medicare Other | Admitting: Psychology

## 2017-10-21 ENCOUNTER — Other Ambulatory Visit: Payer: Self-pay | Admitting: Cardiovascular Disease

## 2017-10-23 ENCOUNTER — Other Ambulatory Visit: Payer: Self-pay | Admitting: Internal Medicine

## 2017-10-23 ENCOUNTER — Ambulatory Visit (INDEPENDENT_AMBULATORY_CARE_PROVIDER_SITE_OTHER): Payer: Medicare Other | Admitting: Psychology

## 2017-10-23 DIAGNOSIS — F4312 Post-traumatic stress disorder, chronic: Secondary | ICD-10-CM | POA: Diagnosis not present

## 2017-10-23 DIAGNOSIS — F4323 Adjustment disorder with mixed anxiety and depressed mood: Secondary | ICD-10-CM | POA: Diagnosis not present

## 2017-10-28 ENCOUNTER — Other Ambulatory Visit: Payer: Self-pay | Admitting: Internal Medicine

## 2017-10-30 ENCOUNTER — Ambulatory Visit: Payer: Medicare Other | Admitting: Psychology

## 2017-11-01 ENCOUNTER — Ambulatory Visit (INDEPENDENT_AMBULATORY_CARE_PROVIDER_SITE_OTHER): Payer: Medicare Other | Admitting: Cardiovascular Disease

## 2017-11-01 ENCOUNTER — Encounter: Payer: Self-pay | Admitting: Cardiovascular Disease

## 2017-11-01 VITALS — BP 156/80 | HR 68 | Ht 68.0 in | Wt 257.5 lb

## 2017-11-01 DIAGNOSIS — E7800 Pure hypercholesterolemia, unspecified: Secondary | ICD-10-CM

## 2017-11-01 DIAGNOSIS — I6529 Occlusion and stenosis of unspecified carotid artery: Secondary | ICD-10-CM | POA: Diagnosis not present

## 2017-11-01 DIAGNOSIS — E782 Mixed hyperlipidemia: Secondary | ICD-10-CM

## 2017-11-01 DIAGNOSIS — I359 Nonrheumatic aortic valve disorder, unspecified: Secondary | ICD-10-CM | POA: Diagnosis not present

## 2017-11-01 DIAGNOSIS — I10 Essential (primary) hypertension: Secondary | ICD-10-CM

## 2017-11-01 DIAGNOSIS — I2510 Atherosclerotic heart disease of native coronary artery without angina pectoris: Secondary | ICD-10-CM | POA: Diagnosis not present

## 2017-11-01 DIAGNOSIS — I779 Disorder of arteries and arterioles, unspecified: Secondary | ICD-10-CM

## 2017-11-01 DIAGNOSIS — I739 Peripheral vascular disease, unspecified: Secondary | ICD-10-CM

## 2017-11-01 MED ORDER — ICOSAPENT ETHYL 1 G PO CAPS: 2.0000 g | ORAL_CAPSULE | Freq: Two times a day (BID) | ORAL | 5 refills | Status: DC

## 2017-11-01 MED ORDER — LOSARTAN POTASSIUM 100 MG PO TABS: 100.0000 mg | ORAL_TABLET | Freq: Every day | ORAL | 3 refills | Status: DC

## 2017-11-01 NOTE — Progress Notes (Signed)
Cardiology Office Note   Date:  11/01/2017   ID:  [name] [name], DOB Jul 22, 1956, MRN [id number]  PCP:  [name], MD  Cardiologist:   [name], MD   Chief Complaint  Patient presents with  . OTHER    6 month f/u no compaints today. Meds reviewed verbally with pt.      History of Present Illness: [name] [name] is a 62 y.o. male who presents for a follow-up visit regarding coronary artery disease. He is status post CABG in 2012 for 3 vessel coronary artery disease. He has extensive medical problems that include carotid artery disease status post left carotid endarterectomy , aortic stenosis, tobacco use, obesity, psoriasis, type 2 diabetes, hypertension, hyperlipidemia and peripheral arterial disease which is being followed by Dr. Ronalee Belts. Most recent stress test was done in of 2013 which showed no evidence of ischemia.  Most recent echocardiogram in December 2018 showed an EF of 50-55% with stable moderate aortic stenosis with a mean gradient of 24 mmHg and valve area of 1.47.  He has been doing well with no recent chest pain or shortness of breath.  He quit smoking recently.  Blood pressure continues to run high.  Past Medical History:  Diagnosis Date  . Adenomatous colon polyp   . Allergy   . Angina at rest [facility])    chronic  . Anxiety   . Arthritis    RA  . Bell's palsy 2007  . Carotid artery occlusion   . Cataract    Dr. Dawna Part  . Coronary artery disease   . Depression   . Diabetes mellitus   . Diverticulosis   . Duodenitis   . DVT (deep venous thrombosis) (HCC)    in leg  . Dyspnea    DOE  . Gastropathy   . GERD (gastroesophageal reflux disease)   . Heart murmur   . Helicobacter pylori gastritis   . Hyperlipidemia   . Hypertension   . Mild aortic stenosis   . Neuropathy   . Obesity   . Peripheral vascular disease ([place])   . Post splenectomy syndrome   . Psoriasis   . Psoriatic arthritis ([place])   . Sleep apnea    uses cpap  . SOB  (shortness of breath)   . Stroke ([place])   . Tobacco use disorder    recently quit  . Tubular adenoma 08/2013   Dr. Hilarie Fredrickson  . Weak urinary stream     Past Surgical History:  Procedure Laterality Date  . CARDIAC CATHETERIZATION  08/2010   LAD: 80% ISR, RCA: 80% ostial, OM 80-90%  . CAROTID ENDARTERECTOMY  08/2010   left/ Dr. Kellie Simmering  . CATARACT EXTRACTION W/PHACO Left 07/09/2017   Procedure: CATARACT EXTRACTION PHACO AND INTRAOCULAR LENS PLACEMENT (IOC);  Surgeon: Birder Robson, MD;  Location: [facility] ORS;  Service: Ophthalmology;  Laterality: Left;  [place] 00:23 AP% 14.2 CDE 3.26 Fluid pack lot # [id number]  . CATARACT EXTRACTION W/PHACO Right 09/10/2017   Procedure: CATARACT EXTRACTION PHACO AND INTRAOCULAR LENS PLACEMENT (IOC);  Surgeon: Birder Robson, MD;  Location: [facility] ORS;  Service: Ophthalmology;  Laterality: Right;  [place] 00:26.4 AP% 17.3 CDE 4.59 Fluid Pack Lot # [id number]  . COLONOSCOPY    . CORONARY ANGIOPLASTY     LAD: before CABG  . CORONARY ARTERY BYPASS GRAFT  09/06/2010   At [facility]: LIMA to LAD, left radial to RCA, sequential SVG to OM3 and 4  . heart stents  Jan 2011   leg  stents 06/2009 and 03/2010  . PTA of illiac and SFA  multiple   Dr. Ronalee Belts, s/p revision 11/2012  . SPLENECTOMY    . VASCULAR SURGERY     LEG STENTS     Current Outpatient Medications  Medication Sig Dispense Refill  . ACCU-CHEK FASTCLIX LANCETS MISC Use to check sugar 6 times daily. 600 each 5  . albuterol (PROAIR HFA) 108 (90 Base) MCG/ACT inhaler Inhale 2 puffs into the lungs every 6 (six) hours as needed for shortness of breath. 1 Inhaler 1  . aspirin 81 MG tablet Take 81 mg by mouth daily.    [name] BAYER CONTOUR TEST test strip TEST THREE TIMES DAILY AS DIRECTED 300 each 1  . buPROPion (WELLBUTRIN XL) 300 MG 24 hr tablet Take 1 tablet (300 mg total) by mouth daily. 90 tablet 1  . busPIRone (BUSPAR) 10 MG tablet Take 2 tablets (20 mg total) by mouth 2 (two) times daily. 360 tablet 1  . calcitRIOL  (ROCALTROL) 0.25 MCG capsule Take 0.25 mcg by mouth 3 (three) times a week.    . carvedilol (COREG) 12.5 MG tablet TAKE 1 TABLET BY MOUTH TWICE DAILY 60 tablet 0  . Cholecalciferol (VITAMIN D PO) Take 1 capsule by mouth daily.     . clopidogrel (PLAVIX) 75 MG tablet TAKE 1 TABLET BY MOUTH ONCE DAILY 90 tablet 1  . cyclobenzaprine (FLEXERIL) 10 MG tablet take 1 tablet by mouth three times a day if needed for muscle spasm 30 tablet 0  . finasteride (PROSCAR) 5 MG tablet Take 5 mg by mouth daily.    . InFLIXimab (REMICADE IV) Inject into the vein every 6 (six) weeks.    . insulin NPH Human (NOVOLIN N RELION) 100 UNIT/ML injection Inject 40 units in am and 20 units in the evening (Patient taking differently: Inject 20 Units into the skin daily as needed (High Blood Sugar). ) 40 mL 2  . insulin regular (NOVOLIN R,HUMULIN R) 250 units/2.33mL (100 units/mL) injection Inject 0.1-0.3 mLs (10-30 Units total) into the skin 3 (three) times daily before meals. (Patient taking differently: Inject 10-30 Units into the skin 3 (three) times daily before meals. Sliding Scale as needed for high blood sugar 3 times daily before meals) 40 mL 5  . ketoconazole (NIZORAL) 2 % cream Apply topically 2 (two) times daily. For rash 15 g 0  . losartan (COZAAR) 25 MG tablet take 1 tablet by mouth once daily 90 tablet 3  . losartan (COZAAR) 50 MG tablet Take 50 mg by mouth daily.    [name] MAGNESIUM PO Take 400 mg by mouth daily.     . nitroGLYCERIN (NITROSTAT) 0.4 MG SL tablet Place 1 tablet (0.4 mg total) under the tongue every 5 (five) minutes as needed. 25 tablet 1  . omeprazole (PRILOSEC) 20 MG capsule Take 1 capsule (20 mg total) by mouth 2 (two) times daily. (Patient taking differently: Take 20 mg by mouth 2 (two) times daily as needed (HEARTBURN). ) 20 capsule 0  . sertraline (ZOLOFT) 100 MG tablet Take 2 tablets (200 mg total) by mouth daily. (Patient taking differently: Take 150 mg by mouth daily. ) 180 tablet 1  . simvastatin  (ZOCOR) 20 MG tablet TAKE 1 TABLET BY MOUTH ONCE DAILY AT 6PM 90 tablet 1  . tamsulosin (FLOMAX) 0.4 MG CAPS capsule take 2 capsule by mouth daily (Patient taking differently: Take 0.4 mg by mouth 2 (two) times daily. ) 60 capsule 11   No current facility-administered medications  for this visit.     Allergies:   Contrast media [iodinated diagnostic agents]; Glipizide; Metrizamide; and Penicillins    Social History:  The patient  reports that he quit smoking about 6 months ago. His smoking use included cigarettes. He has a 30.00 pack-year smoking history. He has never used smokeless tobacco. He reports that he does not drink alcohol or use drugs.   Family History:  The patient's family history includes Alcoholism in his brother, brother, sister, sister, and sister; Alzheimer's disease in his mother; Arthritis in his father and mother; Breast cancer (age of onset: 62) in his sister; Colon polyps (age of onset: 76) in his paternal grandfather; Dementia in his mother; Diabetes in his paternal grandfather; Epilepsy in his sister; Lung cancer (age of onset: 76) in his father.    ROS:  Please see the history of present illness.   Otherwise, review of systems are positive for none.   All other systems are reviewed and negative.    PHYSICAL EXAM: VS:  BP (!) 156/80 (BP Location: Left Arm, Patient Position: Sitting, Cuff Size: Large)   Pulse 68   Ht 5\' 8"  (1.727 m)   Wt 257 lb 8 oz (116.8 kg)   BMI 39.15 kg/m  , BMI Body mass index is 39.15 kg/m. GEN: Well nourished, well developed, in no acute distress  HEENT: normal  Neck: no JVD, or masses. Bilateral carotid bruits Cardiac: RRR; no rubs, or gallops,no edema . 3/6 crescendo decrescendo systolic murmur in the aortic area which is mid peaking with diminished S2 Respiratory:  clear to auscultation bilaterally, normal work of breathing GI: soft, nontender, nondistended, + BS MS: no deformity or atrophy  Skin: warm and dry, no rash Neuro:   Strength and sensation are intact Psych: euthymic mood, full affect   EKG:  EKG is ordered today. EKG showed normal sinus rhythm with left anterior fascicular block and poor R wave progression.    Recent Labs: 03/15/2017: ALT 10; BUN 29; Creatinine, Ser 2.09; Hemoglobin 13.5; Platelets 139.0; Potassium 4.4; Sodium 140 04/11/2017: TSH 2.170    Lipid Panel    Component Value Date/Time   CHOL 132 03/15/2017 1021   TRIG 264.0 (H) 03/15/2017 1021   HDL 22.30 (L) 03/15/2017 1021   CHOLHDL 6 03/15/2017 1021   VLDL 52.8 (H) 03/15/2017 1021   LDLCALC 55 12/13/2015 0846   LDLDIRECT 61.0 03/15/2017 1021      Wt Readings from Last 3 Encounters:  11/01/17 257 lb 8 oz (116.8 kg)  10/15/17 256 lb (116.1 kg)  10/08/17 256 lb 12 oz (116.5 kg)       No flowsheet data found.    ASSESSMENT AND PLAN:  1.  Coronary artery disease involving native coronary arteries without angina: He is overall stable from a cardiac standpoint with no worsening symptoms. Continue medical therapy. He is on long-term dual antiplatelet therapy due to extensive generalized atherosclerosis (CAD, PAD and carotid disease).   2. Moderate aortic stenosis: This continues to be moderate.  Repeat echocardiogram in early 2020.  3. Bilateral carotid disease status post left carotid endarterectomy.   Most recent carotid Doppler in August showed stable moderate right carotid stenosis.  Repeat study in August of this year.  4. Essential hypertension:   Blood pressure is elevated.  I increased losartan to 100 mg daily.  5. Hyperlipidemia: Most recent LDL was 54 on current dose on simvastatin.  However, he has chronically elevated triglyceride above 200.  Given the recent data, I recommend  adding Vascepa 2 g twice daily.  Check fasting lipid and liver profile in 6 weeks.   Disposition:   FU with me in 6 months  Signed,  [name], MD  11/01/2017 3:48 PM    [place]

## 2017-11-01 NOTE — Patient Instructions (Addendum)
Medication Instructions:  Your physician has recommended you make the following change in your medication:  INCREASE losartan to 100mg  once daily START taking vascepa 2grams twice daily    Labwork: Fasting lipid and liver profile in 6 weeks at the [facility] lab. No appointment needed.  Testing/Procedures: none  Follow-Up: Your physician wants you to follow-up in: 6 months with Dr. Fletcher Anon.  You will receive a reminder letter in the mail two months in advance. If you don't receive a letter, please call our office to schedule the follow-up appointment.  Any Other Special Instructions Will Be Listed Below (If Applicable).     If you need a refill on your cardiac medications before your next appointment, please call your pharmacy.  Icosapent ethyl capsules What is this medicine? ICOSAPENT ETHYL (eye KOE sa pent eth il) contains essential fats. It is used to treat high triglyceride levels. This medicine may be used for other purposes; ask your health care provider or pharmacist if you have questions. COMMON BRAND NAME(S): VASCEPA What should I tell my health care provider before I take this medicine? They need to know if you have any of these conditions: -bleeding disorders -liver disease -an unusual or allergic reaction to icosapent ethyl, fish, shellfish, other medicines, foods, dyes, or preservatives -pregnant or trying to get pregnant -breast-feeding How should I use this medicine? Take this medicine by mouth with a glass of water. Follow the directions on the prescription label. Take this medicine with food. Do not cut, crush or chew this medicine. Take your medicine at regular intervals. Do not take it more often than directed. Do not stop taking except on your doctor's advice. Talk to your pediatrician regarding the use of this medicine in children. Special care may be needed. Overdosage: If you think you have taken too much of this medicine contact a poison control center or  emergency room at once. NOTE: This medicine is only for you. Do not share this medicine with others. What if I miss a dose? If you miss a dose, take it as soon as you can. If it is almost time for your next dose, take only that dose. Do not take double or extra doses. What may interact with this medicine? This medicine may interact with the following medications: -aspirin and aspirin-like medicines -beta-blockers like metoprolol and propranolol -certain medicines that treat or prevent blood clots like warfarin, enoxaparin, dalteparin, apixaban, dabigatran, and rivaroxaban -diuretics -male hormones, like estrogens and birth control pills This list may not describe all possible interactions. Give your health care provider a list of all the medicines, herbs, non-prescription drugs, or dietary supplements you use. Also tell them if you smoke, drink alcohol, or use illegal drugs. Some items may interact with your medicine. What should I watch for while using this medicine? You may need blood work done while you are taking this medicine. Follow a good diet and exercise plan. Taking this medicine does not replace a healthy lifestyle. Some foods that have omega-3 fatty acids naturally are fatty fish like albacore tuna, halibut, herring, mackerel, lake trout, salmon, and sardines. If you are scheduled for any medical or dental procedure, tell your healthcare provider that you are taking this medicine. You may need to stop taking this medicine before the procedure. What side effects may I notice from receiving this medicine? Side effects that you should report to your doctor or health care professional as soon as possible: -allergic reactions like skin rash, itching or hives, swelling of the  face, lips, or tongue -breathing problems -unusual bleeding or bruising Side effects that usually do not require medical attention (report to your doctor or health care professional if they continue or are  bothersome): -joint pain -sore throat This list may not describe all possible side effects. Call your doctor for medical advice about side effects. You may report side effects to FDA at 1-800-FDA-1088. Where should I keep my medicine? Keep out of the reach of children. Store at room temperature between 15 and 30 degrees C (59 and 86 degrees F). Throw away any unused medicine after the expiration date. NOTE: This sheet is a summary. It may not cover all possible information. If you have questions about this medicine, talk to your doctor, pharmacist, or health care provider.  2018 Elsevier/Gold Standard (2015-08-11 13:40:36)

## 2017-11-05 DIAGNOSIS — M0579 Rheumatoid arthritis with rheumatoid factor of multiple sites without organ or systems involvement: Secondary | ICD-10-CM | POA: Diagnosis not present

## 2017-11-06 ENCOUNTER — Ambulatory Visit (INDEPENDENT_AMBULATORY_CARE_PROVIDER_SITE_OTHER): Payer: Medicare Other | Admitting: Psychology

## 2017-11-06 DIAGNOSIS — F4312 Post-traumatic stress disorder, chronic: Secondary | ICD-10-CM | POA: Diagnosis not present

## 2017-11-06 DIAGNOSIS — F4323 Adjustment disorder with mixed anxiety and depressed mood: Secondary | ICD-10-CM

## 2017-11-11 ENCOUNTER — Other Ambulatory Visit: Payer: Self-pay

## 2017-11-11 ENCOUNTER — Other Ambulatory Visit: Payer: Self-pay | Admitting: Cardiovascular Disease

## 2017-11-11 MED ORDER — ACCU-CHEK FASTCLIX LANCETS MISC: 5 refills | Status: DC

## 2017-11-13 ENCOUNTER — Ambulatory Visit: Payer: Medicare Other | Admitting: Psychology

## 2017-11-15 NOTE — Progress Notes (Signed)
[name] [name] was seen today in neurologic consultation at the request of [name] Carbon, MD.   The patient presents today for consultation regarding abnormal MRI of the brain.  This was done because of headache.  The patient is a 62 y.o. year old male with a history of headache.  The patient reports that headache began about 3 months ago.  The location of the headache is in the posterior occiput.  He didn't consider himself a headachey type of person before 3 months ago.  It feels like a sledgehammer.  There is photophobia.  There is lightheadedness and he doesn't drive because of it.  There is no phonophobia.  The patient reports that there is no associated nausea or emesis. He reports soreness of the occiput to the touch.  The headache radiates to the frontal region bilaterally.  The patient reports no autonomic features such as rhinorrhea, conjunctival erythema, or tearing of the eyes.  Balance has not changed.  He doesn't work outside of the home and states that he is disabled from neuropathy and sleep apnea.  Admits to occasional diplopia but states that he had bells palsy 4 different times and has chronic L facial droop and diplopia started then.  States that he first had bells in his 31's and last episode was 2 years ago.  States that he is taking flexeril for the headaches and takes 2 per day.  Admits to OSAS and wears CPAP faithfully.  Had it adjusted 1 year ago and states that pressure decreased from 18 to 14.    I had the opportunity to review the patient's MRI of the brain with and without gadolinium done on 09/30/2015.  There was mild temporal atrophy and a few rare scattered T2 hyperintensities.  The radiology report said that the ventricles were slightly enlarged, but admitted that it could be ex vacuo, but also said could not rule out NPH.  02/01/16 update:  The patient follows up today.  I have reviewed numerous records since last visit.  He had an occipital nerve block on March 21 and  April 3 and these did not provide long-term benefit.  On April 10 and started him on Topamax.  He called me 2 weeks later to state that it did not help.  I explained to the patient that he needed to give it much more time.  The patient states that headaches are much better but admits that he is only taking topamax prn.  He rarely has headache.   I did do lab work several months ago at our last visit and there were several abnormalities.  His sedimentation rate was very high at 90.  He was sent to the rheumatology office and this was repeated and it was within ranges of normal, but his CRP was very elevated at 77.  He was diagnosed with psoriatic arthritis at the rheumatology office and started on Remicade.  His rash is much better and joint pain is much better.  Since our last visit, his brother was found dead in his home which was obviously distressing for him.  He apparently had been dead for quite some time.  He was [name] y/o.    08/15/16 update: Patient comes in today to follow-up after an emergency room visit on 07/02/2016.  I reviewed those records.  He first went to the emergency room after he had gone to his psychiatrist and his blood pressure was elevated with a systolic in the 735H and some headache along with "  worsening left facial droop" (patient has baseline left facial droop from history of Bell's palsy) and left arm tingling.  Pt completely denies there was any worsening facial droop today, stating that he told the ER that there was stable facial droop from bells palsy.  He denies headache as well and states that he was only there because his BP was high at his doctors office and he was told to go to the hospital because of that.   States that headache did develop in the ER from being frustrated with lack of care.  Admits to L arm tingling but that has been going on a year, off and on and he thinks that is related to a pinched nerve.  It involves the entire arm and all the fingers.  "I had it all day  yesterday."  MRI was done and I reviewed that.  While there is a very small DWI positive area in the right occipital region, seen both on the axial and coronal images, this was not seen on the ADC mapping.  Antiplatelet therapy was recommended.  He was already on an ASA, 81 mg daily faithfully.  He was also on Plavix at the time.  Pt states that it wasn't changed at the hospital.  However, neurology felt that this likely represented complicated migraine, as opposed to stroke.  He does have hx of headache.  Were gone for a while.  Now in the bilateral temporal region.  Feels throbbing.  Off of topamax.  Was on 100 mg daily.    12/31/16 update: Patient seen today in follow-up.  Last visit, I had him restart his Topamax.  He worked up to 50 mg in the morning and 100 mg at night.   He notes more headaches recently but thinks that some of it is blood pressure and weather related.  He had a headache since last Thursday but it is gone now.  "I was doing okay until I had to put my dog down.  My dog was like my kid."   It develops during the day if he gets a headache.  It is bilateral frontal.  No caffeine use.  Only water use.    Trying to lose weight and be more health conscious; had lost weight but looks like gained it back - 13 lbs - over last month.  Still faithful with cpap for OSAS.  Still on zocor and labs rechecked in Jan and I reviewed them.  The records that were made available to me were reviewed.  Seeing psychiatry for mood.  Recently seen in ED for small facial absess and treated and resolved.   11/18/17 update: Patient is seen today in follow-up.  He has not been seen in quite some time.  I last saw him in June, at which point I increase his Topamax to 100 mg twice per day.  He is no longer on the medication.  He states that he d/c it because of switching pharmacies and the medications didn't get changed. Headaches are improved  Last had a carotid ultrasound on March 20, 2017.  This was stable, with 40 to  59% stenosis on the right.  Biggest complaint is memory change and losing train of thought.  He consulted with his primary care physician on October 08, 2017 for this and felt that he may have early vascular dementia.  States that he has lost 2 RX's and "I have no idea what I have done with them."  One of them was Plavix  but RX wouldn't let him refill.  Does have pill box now so he can remember to take medications.  He was having trouble with taking them previously.  He is not driving.  He has multiple other c/o.  He c/o balance loss.  He c/o tremor when he holds utensils.  He states that since last visit he has had multiple family members die, including his brother and he "took that hard."      ALLERGIES:   Allergies  Allergen Reactions  . Contrast Media [Iodinated Diagnostic Agents] Rash and Other (See Comments)    Got very hot and red   . Glipizide Other (See Comments)    ANTIDIABETICS. Burning  . Metrizamide Other (See Comments)    Got very hot and red  . Penicillins Hives and Swelling    Has patient had a PCN reaction causing immediate rash, facial/tongue/throat swelling, SOB or lightheadedness with hypotension: yes Has patient had a PCN reaction causing severe rash involving mucus membranes or skin necrosis: no  Has patient had a PCN reaction that required hospitalization: yes Has patient had a PCN reaction occurring within the last 10 years: no If all of the above answers are "NO", then may proceed with Cephalosporin use.     CURRENT MEDICATIONS:  Current Outpatient Medications on File Prior to Visit  Medication Sig Dispense Refill  . ACCU-CHEK FASTCLIX LANCETS MISC Use to check sugar 6 times daily. 600 each 5  . albuterol (PROAIR HFA) 108 (90 Base) MCG/ACT inhaler Inhale 2 puffs into the lungs every 6 (six) hours as needed for shortness of breath. 1 Inhaler 1  . aspirin 81 MG tablet Take 81 mg by mouth daily.    [name] BAYER CONTOUR TEST test strip TEST THREE TIMES DAILY AS DIRECTED 300  each 1  . buPROPion (WELLBUTRIN XL) 300 MG 24 hr tablet Take 1 tablet (300 mg total) by mouth daily. 90 tablet 1  . busPIRone (BUSPAR) 10 MG tablet Take 2 tablets (20 mg total) by mouth 2 (two) times daily. 360 tablet 1  . calcitRIOL (ROCALTROL) 0.25 MCG capsule Take 0.25 mcg by mouth 3 (three) times a week.    . carvedilol (COREG) 12.5 MG tablet TAKE 1 TABLET BY MOUTH TWICE DAILY 60 tablet 0  . Cholecalciferol (VITAMIN D PO) Take 1 capsule by mouth daily.     . clopidogrel (PLAVIX) 75 MG tablet TAKE 1 TABLET BY MOUTH ONCE DAILY 90 tablet 2  . cyclobenzaprine (FLEXERIL) 10 MG tablet take 1 tablet by mouth three times a day if needed for muscle spasm 30 tablet 0  . finasteride (PROSCAR) 5 MG tablet Take 5 mg by mouth daily.    Vanessa Kick Ethyl (VASCEPA) 1 g CAPS Take 2 capsules (2 g total) by mouth 2 (two) times daily. 120 capsule 5  . InFLIXimab (REMICADE IV) Inject into the vein every 6 (six) weeks.    . insulin NPH Human (NOVOLIN N RELION) 100 UNIT/ML injection Inject 40 units in am and 20 units in the evening (Patient taking differently: Inject 20 Units into the skin daily as needed (High Blood Sugar). ) 40 mL 2  . insulin regular (NOVOLIN R,HUMULIN R) 250 units/2.45mL (100 units/mL) injection Inject 0.1-0.3 mLs (10-30 Units total) into the skin 3 (three) times daily before meals. (Patient taking differently: Inject 10-30 Units into the skin 3 (three) times daily before meals. Sliding Scale as needed for high blood sugar 3 times daily before meals) 40 mL 5  . ketoconazole (NIZORAL)  2 % cream Apply topically 2 (two) times daily. For rash 15 g 0  . losartan (COZAAR) 100 MG tablet Take 1 tablet (100 mg total) by mouth daily. 30 tablet 3  . MAGNESIUM PO Take 400 mg by mouth daily.     . nitroGLYCERIN (NITROSTAT) 0.4 MG SL tablet Place 1 tablet (0.4 mg total) under the tongue every 5 (five) minutes as needed. 25 tablet 1  . omeprazole (PRILOSEC) 20 MG capsule Take 1 capsule (20 mg total) by mouth 2  (two) times daily. (Patient taking differently: Take 20 mg by mouth 2 (two) times daily as needed (HEARTBURN). ) 20 capsule 0  . sertraline (ZOLOFT) 100 MG tablet Take 2 tablets (200 mg total) by mouth daily. (Patient taking differently: Take 150 mg by mouth daily. ) 180 tablet 1  . simvastatin (ZOCOR) 20 MG tablet TAKE 1 TABLET BY MOUTH ONCE DAILY AT 6PM 90 tablet 1  . tamsulosin (FLOMAX) 0.4 MG CAPS capsule take 2 capsule by mouth daily (Patient taking differently: Take 0.4 mg by mouth 2 (two) times daily. ) 60 capsule 11   No current facility-administered medications on file prior to visit.     PAST MEDICAL HISTORY:   Past Medical History:  Diagnosis Date  . Adenomatous colon polyp   . Allergy   . Angina at rest [facility])    chronic  . Anxiety   . Arthritis    RA  . Bell's palsy 2007  . Carotid artery occlusion   . Cataract    Dr. Dawna Part  . Coronary artery disease   . Depression   . Diabetes mellitus   . Diverticulosis   . Duodenitis   . DVT (deep venous thrombosis) ([facility])    in leg  . Dyspnea    DOE  . Gastropathy   . GERD (gastroesophageal reflux disease)   . Heart murmur   . Helicobacter pylori gastritis   . Hyperlipidemia   . Hypertension   . Mild aortic stenosis   . Neuropathy   . Obesity   . Peripheral vascular disease ([place])   . Post splenectomy syndrome   . Psoriasis   . Psoriatic arthritis ([place])   . Sleep apnea    uses cpap  . SOB (shortness of breath)   . Stroke ([place])   . Tobacco use disorder    recently quit  . Tubular adenoma 08/2013   Dr. Hilarie Fredrickson  . Weak urinary stream     PAST SURGICAL HISTORY:   Past Surgical History:  Procedure Laterality Date  . CARDIAC CATHETERIZATION  08/2010   LAD: 80% ISR, RCA: 80% ostial, OM 80-90%  . CAROTID ENDARTERECTOMY  08/2010   left/ Dr. Kellie Simmering  . CATARACT EXTRACTION W/PHACO Left 07/09/2017   Procedure: CATARACT EXTRACTION PHACO AND INTRAOCULAR LENS PLACEMENT (IOC);  Surgeon: Birder Robson, MD;  Location: [facility]  ORS;  Service: Ophthalmology;  Laterality: Left;  [place] 00:23 AP% 14.2 CDE 3.26 Fluid pack lot # [id number]  . CATARACT EXTRACTION W/PHACO Right 09/10/2017   Procedure: CATARACT EXTRACTION PHACO AND INTRAOCULAR LENS PLACEMENT (IOC);  Surgeon: Birder Robson, MD;  Location: [facility] ORS;  Service: Ophthalmology;  Laterality: Right;  [place] 00:26.4 AP% 17.3 CDE 4.59 Fluid Pack Lot # [id number]  . COLONOSCOPY    . CORONARY ANGIOPLASTY     LAD: before CABG  . CORONARY ARTERY BYPASS GRAFT  09/06/2010   At [facility]: LIMA to LAD, left radial to RCA, sequential SVG to OM3 and 4  . heart stents  Jan  2011   leg stents 06/2009 and 03/2010  . PTA of illiac and SFA  multiple   Dr. Ronalee Belts, s/p revision 11/2012  . SPLENECTOMY    . VASCULAR SURGERY     LEG STENTS    SOCIAL HISTORY:   Social History   Socioeconomic History  . Marital status: Married    Spouse name: Not on file  . Number of children: 0  . Years of education: Not on file  . Highest education level: Not on file  Occupational History  . Occupation: Chief Operating Officer at [employer]: Disabled mostly due to neuropathy  Social Needs  . Financial resource strain: Not on file  . Food insecurity:    Worry: Not on file    Inability: Not on file  . Transportation needs:    Medical: Not on file    Non-medical: Not on file  Tobacco Use  . Smoking status: Former Smoker    Packs/day: 0.75    Years: 40.00    Pack years: 30.00    Types: Cigarettes    Last attempt to quit: 04/15/2017    Years since quitting: 0.5  . Smokeless tobacco: Never Used  . Tobacco comment: 1 cigarette a day  Substance and Sexual Activity  . Alcohol use: No  . Drug use: No  . Sexual activity: Never  Lifestyle  . Physical activity:    Days per week: Not on file    Minutes per session: Not on file  . Stress: Not on file  Relationships  . Social connections:    Talks on phone: Not on file    Gets together: Not on file    Attends religious service: Not on file     Active member of club or organization: Not on file    Attends meetings of clubs or organizations: Not on file    Relationship status: Not on file  . Intimate partner violence:    Fear of current or ex partner: Not on file    Emotionally abused: Not on file    Physically abused: Not on file    Forced sexual activity: Not on file  Other Topics Concern  . Not on file  Social History Narrative   Has living will   Wife is health care POA---brother Ronalee Belts is alternate   Would accept resuscitation --but no prolonged ventilation   No tube feeds if cognitively unaware    FAMILY HISTORY:   Family Status  Relation Name Status  . Father  Deceased       lung cancer  . Sister  Deceased       breast cancer  . Mother  Alive       dementia, alzheimer's  . Brother  Deceased at age 1       found dead in home  . Sister  Deceased       seizures  . PGF  (Not Specified)  . Sister  Deceased  . Sister  (Not Specified)  . Brother  Deceased    ROS:  Review of Systems  Constitutional: Negative.   HENT: Negative.   Eyes: Negative.   Respiratory: Negative.   Cardiovascular: Negative.   Musculoskeletal: Negative.   Skin: Positive for rash (due to psoriasis but well controlled with remicade).  Neurological: Positive for dizziness and tremors.  Psychiatric/Behavioral: Positive for depression (No SI/HI) and memory loss.    PHYSICAL EXAMINATION:    VITALS:   Vitals:   11/18/17 0758  BP: 130/80  Pulse: 82  SpO2: 95%  Weight: 257 lb 6 oz (116.7 kg)  Height: 5\' 8"  (1.727 m)   Wt Readings from Last 3 Encounters:  11/18/17 257 lb 6 oz (116.7 kg)  11/01/17 257 lb 8 oz (116.8 kg)  10/15/17 256 lb (116.1 kg)     GEN:  Appears stated age and in NAD. HEENT:  Normocephalic, atraumatic. The mucous membranes are moist. The superficial temporal arteries are without ropiness or tenderness.  There is tenderness over bilateral occipital notches Cardiovascular: RRR with 3/6 SEM Lungs: CTAB Neck: no  bruit  NEUROLOGICAL: Orientation:  Pt is alert and oriented x 3.  Able to provide details of history quite well Cranial nerves: There is mild L facial droop with decreased blink on the L and decreased palpebral fissure on the L (chronic).  Speech is fluent and clear. Soft palate rises symmetrically and there is no tongue deviation. Hearing is intact to conversational tone. Tone: Tone is good throughout. Sensation: Sensation is intact to light touch and pinprick throughout (facial, extremities, trunk). Vibration is decreased distally.  No change in pinprick in stocking distribution Coordination:  The patient has no difficulty with RAM's or FNF bilaterally.  He does have somewhat nonphysiologic FNF with eyes closed (very slow and when just gets close to nose will stop at same location and has minimal tremor) Motor: Strength is 5/5 in the UE/LE Gait and Station: The patient is able to ambulate without difficulty. He does have trouble ambulating in a tandem fashion. Abnormal movements:  There is no tremor except as described as above.     LABS:  Lab Results  Component Value Date   WBC 6.4 03/15/2017   HGB 13.5 03/15/2017   HCT 41.0 03/15/2017   MCV 96.3 03/15/2017   PLT 139.0 (L) 03/15/2017     Chemistry      Component Value Date/Time   NA 140 03/15/2017 1021   NA 138 05/04/2014 0816   K 4.4 03/15/2017 1021   K 4.1 05/04/2014 0816   CL 111 03/15/2017 1021   CL 101 05/04/2014 0816   CO2 23 03/15/2017 1021   CO2 29 05/04/2014 0816   BUN 29 (H) 03/15/2017 1021   BUN 24 (H) 05/04/2014 0816   CREATININE 2.09 (H) 03/15/2017 1021   CREATININE 1.[name] (H) 05/04/2014 0816      Component Value Date/Time   CALCIUM 8.5 03/15/2017 1021   CALCIUM 9.3 05/04/2014 0816   ALKPHOS 91 03/15/2017 1021   AST 10 03/15/2017 1021   ALT 10 03/15/2017 1021   BILITOT 0.3 03/15/2017 1021      Lab Results  Component Value Date   HGBA1C 5.9 08/12/2017    Lab Results  Component Value Date   ANA POS  (A) 10/05/2015   RF 34 (H) 10/05/2015    Lab Results  Component Value Date   ANA POS (A) 10/05/2015    Lab Results  Component Value Date   ESRSEDRATE 90 (H) 10/05/2015   Lab Results  Component Value Date   CHOL 132 03/15/2017   HDL 22.30 (L) 03/15/2017   LDLCALC 55 12/13/2015   LDLDIRECT 61.0 03/15/2017   TRIG 264.0 (H) 03/15/2017   CHOLHDL 6 03/15/2017   Lab Results  Component Value Date   TSH 2.170 04/11/2017     IMPRESSIONS/PLAN:  1. Abnormal brain scan (06/2016)  -The patient certainly did have a DWI positive lesion on his brain scan, but without ADC correlate.  I am not sure that this completely  correlated with symptoms, but agreed with the neurologist at the hospital but I would recommend antiplatelet therapy.  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   - We discussed signs and sx's of stroke and importance of calling 911 immediately should he have any of these.  Patient education was provided.    -Talked about stroke risk factors which include DM, HTN, hyperlipidemia, previous hx of stroke, sleep apnea.  Talked about those risk factors which are modifiable.  -on Plavix but lost RX and insurance won't refill until June.  Told him to make his cardiologist aware but may take asa, 81 mg, until gets plavix filled  -Talked about importance of blood pressure control with a goal <130/80 mm Hg.   -Talked about importance of lipid control and proper diet.  Lipids should be managed intensively, with a goal LDL < 70 mg/dL.  Last LDL was 55 in 07/2016.   Repeat was ordered in 10/2017 but not yet done.   He wasn't fasting that day and is today and we will order today.  Pt states that he was unaware this was even ordered. Currently on zocor 20 mg daily.    -I counseled the patient on measures to reduce stroke risk, including the importance of medication compliance, risk factor control, exercise, healthy diet, and avoidance of smoking.  His HDL was only 24.6  and talked about raising that through exercise.    - Last had a carotid ultrasound on March 20, 2017.  This was stable, with 40 to 59% stenosis on the right.   -has sleep apnea which is risk factor and he is very faithful with that, even when using taking naps.   2.  Psoriatic arthritis  -on remicade.  Seeing Dr. Trudie Reed and doing markedly better  3.  Renal insufficiency  -seeing [facility].  Kidney function has worsened over the last few years.  4.  Memory loss  -I think most of this is likely depression related.  Had multiple family members die recently, including his brother.  I do not see evidence of a neurodegenerative process, but we did talk about neurocognitive testing to make sure that we are not missing something early.  He does report he is seeing Pervis Hocking.  He used to have a psychiatrist but states that Dr. [name] takes care of RX them now.  He admits to depression and also states that family members living with him are contributing.    -will repeat MRI brain to make sure that nothing new, although I am doubtful  5.  Loss of balance  -I didn't really note this on examination today except for tandem gait.  May have mild degree of diabetic PN.  Discussed controlling BS, which he appears to be doing.  Safety discussed.  6.  Follow up is anticipated after patient sees Dr. Si Raider, sooner should new neurologic issues arise.

## 2017-11-17 ENCOUNTER — Other Ambulatory Visit: Payer: Self-pay | Admitting: Cardiovascular Disease

## 2017-11-18 ENCOUNTER — Encounter: Payer: Self-pay | Admitting: Neurology

## 2017-11-18 ENCOUNTER — Other Ambulatory Visit: Payer: Medicare Other

## 2017-11-18 ENCOUNTER — Ambulatory Visit (INDEPENDENT_AMBULATORY_CARE_PROVIDER_SITE_OTHER): Payer: Medicare Other | Admitting: Neurology

## 2017-11-18 VITALS — BP 130/80 | HR 82 | Ht 68.0 in | Wt 257.4 lb

## 2017-11-18 DIAGNOSIS — Z5181 Encounter for therapeutic drug level monitoring: Secondary | ICD-10-CM | POA: Diagnosis not present

## 2017-11-18 DIAGNOSIS — I6529 Occlusion and stenosis of unspecified carotid artery: Secondary | ICD-10-CM

## 2017-11-18 DIAGNOSIS — R413 Other amnesia: Secondary | ICD-10-CM

## 2017-11-18 DIAGNOSIS — E785 Hyperlipidemia, unspecified: Secondary | ICD-10-CM | POA: Diagnosis not present

## 2017-11-18 DIAGNOSIS — F330 Major depressive disorder, recurrent, mild: Secondary | ICD-10-CM | POA: Diagnosis not present

## 2017-11-18 DIAGNOSIS — R9402 Abnormal brain scan: Secondary | ICD-10-CM

## 2017-11-18 LAB — HEPATIC FUNCTION PANEL
AG Ratio: 1.3 (calc) (ref 1.0–2.5)
ALT: 14 U/L (ref 9–46)
AST: 17 U/L (ref 10–35)
Albumin: 4 g/dL (ref 3.6–5.1)
Alkaline phosphatase (APISO): 94 U/L (ref 40–115)
Bilirubin, Direct: 0.1 mg/dL (ref 0.0–0.2)
Globulin: 3.1 g/dL (calc) (ref 1.9–3.7)
Indirect Bilirubin: 0.4 mg/dL (calc) (ref 0.2–1.2)
Total Bilirubin: 0.5 mg/dL (ref 0.2–1.2)
Total Protein: 7.1 g/dL (ref 6.1–8.1)

## 2017-11-18 NOTE — Addendum Note (Signed)
Addended by: Kaylyn Lim I on: 11/18/2017 10:02 AM   Modules accepted: Orders

## 2017-11-18 NOTE — Progress Notes (Signed)
303

## 2017-11-19 LAB — LIPID PANEL WITH LDL/HDL RATIO
Cholesterol, Total: 116 mg/dL (ref 100–199)
HDL: 30 mg/dL — ABNORMAL LOW (ref >39)
LDL Calculated: 51 mg/dL (ref 0–99)
LDl/HDL Ratio: 1.7 ratio (ref 0.0–3.6)
Triglycerides: 177 mg/dL — ABNORMAL HIGH (ref 0–149)
VLDL Cholesterol Cal: 35 mg/dL (ref 5–40)

## 2017-11-20 ENCOUNTER — Ambulatory Visit (INDEPENDENT_AMBULATORY_CARE_PROVIDER_SITE_OTHER): Payer: Medicare Other | Admitting: Psychology

## 2017-11-20 DIAGNOSIS — F4323 Adjustment disorder with mixed anxiety and depressed mood: Secondary | ICD-10-CM

## 2017-11-20 DIAGNOSIS — F4312 Post-traumatic stress disorder, chronic: Secondary | ICD-10-CM

## 2017-11-21 ENCOUNTER — Telehealth: Payer: Self-pay | Admitting: Neurology

## 2017-11-21 NOTE — Telephone Encounter (Signed)
LFT's were normal.  Chol/LDL looked good.  TG's just a little elevated but better than in the past!

## 2017-11-21 NOTE — Telephone Encounter (Signed)
Please advise 

## 2017-11-21 NOTE — Telephone Encounter (Signed)
Patient would like the blood results please call

## 2017-11-22 NOTE — Telephone Encounter (Signed)
Patient made aware of results.  

## 2017-11-28 ENCOUNTER — Telehealth: Payer: Self-pay | Admitting: Neurology

## 2017-11-28 NOTE — Telephone Encounter (Signed)
Left message for provider on her VM

## 2017-11-28 NOTE — Telephone Encounter (Signed)
-----   Message from Elenora Fender sent at 11/28/2017  1:28 PM EDT ----- Regarding: Mutual Patient Contact: [phone] Hi Dr. Carles Collet  Doctor Pervis Hocking called to speak with you regarding this mutual patient.   Thanks [employer]

## 2017-11-29 DIAGNOSIS — N183 Chronic kidney disease, stage 3 (moderate): Secondary | ICD-10-CM | POA: Diagnosis not present

## 2017-11-29 DIAGNOSIS — R809 Proteinuria, unspecified: Secondary | ICD-10-CM | POA: Diagnosis not present

## 2017-11-29 DIAGNOSIS — N2581 Secondary hyperparathyroidism of renal origin: Secondary | ICD-10-CM | POA: Diagnosis not present

## 2017-11-29 DIAGNOSIS — E875 Hyperkalemia: Secondary | ICD-10-CM | POA: Diagnosis not present

## 2017-11-29 DIAGNOSIS — D631 Anemia in chronic kidney disease: Secondary | ICD-10-CM | POA: Diagnosis not present

## 2017-11-29 DIAGNOSIS — E1122 Type 2 diabetes mellitus with diabetic chronic kidney disease: Secondary | ICD-10-CM | POA: Diagnosis not present

## 2017-11-29 DIAGNOSIS — E785 Hyperlipidemia, unspecified: Secondary | ICD-10-CM | POA: Diagnosis not present

## 2017-11-29 DIAGNOSIS — I129 Hypertensive chronic kidney disease with stage 1 through stage 4 chronic kidney disease, or unspecified chronic kidney disease: Secondary | ICD-10-CM | POA: Diagnosis not present

## 2017-11-30 ENCOUNTER — Ambulatory Visit
Admission: RE | Admit: 2017-11-30 | Discharge: 2017-11-30 | Disposition: A | Discharge status: Home / Self Care | Payer: Medicare Other | Source: Ambulatory Visit | Attending: Neurology | Admitting: Neurology

## 2017-11-30 DIAGNOSIS — R413 Other amnesia: Secondary | ICD-10-CM | POA: Diagnosis not present

## 2017-11-30 DIAGNOSIS — Z5181 Encounter for therapeutic drug level monitoring: Secondary | ICD-10-CM

## 2017-11-30 DIAGNOSIS — E785 Hyperlipidemia, unspecified: Secondary | ICD-10-CM

## 2017-11-30 DIAGNOSIS — R9402 Abnormal brain scan: Secondary | ICD-10-CM

## 2017-12-02 ENCOUNTER — Telehealth: Payer: Self-pay | Admitting: Neurology

## 2017-12-02 NOTE — Telephone Encounter (Signed)
Mychart message sent to patient.

## 2017-12-02 NOTE — Telephone Encounter (Signed)
-----   Message from [place], DO sent at 12/02/2017  8:52 AM EDT ----- Mild number of T2 hyperintensities.  Luvenia Starch, let pt know that MRI brain without acute change and similar to 2017 exam

## 2017-12-03 ENCOUNTER — Telehealth: Payer: Self-pay | Admitting: Neurology
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
## 2017-12-03 NOTE — Telephone Encounter (Signed)
Patient made aware. Appt cancelled for June. He has one scheduled in October.

## 2017-12-04 ENCOUNTER — Ambulatory Visit (INDEPENDENT_AMBULATORY_CARE_PROVIDER_SITE_OTHER): Payer: Medicare Other | Admitting: Psychology

## 2017-12-04 DIAGNOSIS — F4323 Adjustment disorder with mixed anxiety and depressed mood: Secondary | ICD-10-CM

## 2017-12-04 DIAGNOSIS — F4312 Post-traumatic stress disorder, chronic: Secondary | ICD-10-CM

## 2017-12-09 ENCOUNTER — Encounter: Payer: Self-pay | Admitting: Internal Medicine

## 2017-12-09 ENCOUNTER — Ambulatory Visit (INDEPENDENT_AMBULATORY_CARE_PROVIDER_SITE_OTHER): Payer: Medicare Other | Admitting: Internal Medicine

## 2017-12-09 VITALS — BP 118/68 | HR 83 | Temp 98.2°F | Ht 68.0 in | Wt 256.0 lb

## 2017-12-09 DIAGNOSIS — F331 Major depressive disorder, recurrent, moderate: Secondary | ICD-10-CM | POA: Diagnosis not present

## 2017-12-09 DIAGNOSIS — I6529 Occlusion and stenosis of unspecified carotid artery: Secondary | ICD-10-CM

## 2017-12-09 MED ORDER — DULOXETINE HCL 30 MG PO CPEP
30.0000 mg | ORAL_CAPSULE | Freq: Every day | ORAL | 3 refills | Status: DC
Start: 2017-12-09 — End: 2018-02-10
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
## 2017-12-09 NOTE — Assessment & Plan Note (Addendum)
Mild exacerbation with brother's recent death No longer seeing psychiatrist---wasn't happy then Some element of PTSD as well Continues in counseling  Memory problems likely some element of pseudodementia Will add duloxetine and see back soon History and counseling for all of 25 minute visit

## 2017-12-10 ENCOUNTER — Encounter: Payer: Self-pay | Admitting: Internal Medicine

## 2017-12-10 ENCOUNTER — Ambulatory Visit (INDEPENDENT_AMBULATORY_CARE_PROVIDER_SITE_OTHER): Payer: Medicare Other | Admitting: Internal Medicine

## 2017-12-10 VITALS — BP 142/84 | HR 81 | Ht 68.0 in | Wt 252.4 lb

## 2017-12-10 DIAGNOSIS — E1151 Type 2 diabetes mellitus with diabetic peripheral angiopathy without gangrene: Secondary | ICD-10-CM | POA: Diagnosis not present

## 2017-12-10 DIAGNOSIS — E041 Nontoxic single thyroid nodule: Secondary | ICD-10-CM

## 2017-12-10 DIAGNOSIS — Z794 Long term (current) use of insulin: Secondary | ICD-10-CM

## 2017-12-10 DIAGNOSIS — I6529 Occlusion and stenosis of unspecified carotid artery: Secondary | ICD-10-CM

## 2017-12-10 LAB — POCT GLYCOSYLATED HEMOGLOBIN (HGB A1C): Hemoglobin A1C: 5.9 % — AB (ref 4.0–5.6)

## 2017-12-10 MED ORDER — INSULIN REGULAR HUMAN 100 UNIT/ML IJ SOLN: INTRAMUSCULAR | 5 refills | Status: DC

## 2017-12-10 NOTE — Progress Notes (Signed)
Patient ID: [name] [name], male   DOB: 08/23/1955, 62 y.o.   MRN: [id number]  HPI: DENNISE BAMBER is a 62 y.o.-year-old male, returning for f/u for DM2, dx 2006, insulin-dependent since 2011, uncontrolled, with complications (CAD - CABG 2012, PVD - s/p Stents, Aortic and carotid stenosis, peripheral neuropathy). Last visit 4 mo ago.  He is investigated by neurology (Dr. Carles Collet) Re: forgetfulness. Not driving now.  He had more dietary indiscretions lately.  Last hemoglobin A1c: Lab Results  Component Value Date   HGBA1C 5.9 (A) 12/10/2017   HGBA1C 5.9 08/12/2017   HGBA1C 5.8 04/12/2017   He is on: N and R insulins: Previously on: Insulin Before breakfast Before lunch Before dinner  Regular - clear (short acting) 20 units - smaller meal 25 units - regular meal 30 units - larger meal  10 units - smaller meal 15 units - regular meal 20 units - larger meal  NPH - cloudy (long acting) 40  20  We stopped Victoza.  Now continues only with as needed insulin: N: 10 units + R: 10 units before a meal. Last dose 2 weeks ago!  Pt checks his sugars 3-4x a day: - am: 83-148, 158, 162 >> 87-142 >> 58, 63, 84-152 >> 78-142, 158 - 2h after b'fast:173 >> n/c >> 148-178 >> n/c - before lunch: 132-190, 220 >> n/c >> 165 >> 73-159, 178 >> n/c - 2h after lunch: 114-165 >> 105-169 >> 100-200 (dietary changes) - before dinner:164 >> 192 >> 141 >> 126-209, 236 >> n/c - 2h after dinner: 92-190, 241 >> 113-181, 200 >> 143-201 - bedtime: n/c >> 130, 169 >> n/c Lowest sugar was 58 >> 48; unclear if he has hypoglycemia awareness. Highest sugar was 236 >> 201.  Pt's meals are: - Breakfast: toast + eggs or cereals + milk - Lunch: soup + 1/2 sandwich - Dinner: meat + sweet potatoes or pasta + salad - Snacks: 3: sugar free foods, fruit He quit drinking sodas, + tea- artificial sweetener. + almond milk  - + CKD, last BUN/creatinine:  Lab Results  Component Value Date   BUN 29 (H) 03/15/2017    CREATININE 2.09 (H) 03/15/2017  On Cozaar. Sees nephrology. Has a stone in L kidney, has 3 cysts in R kidney. - +HL; last set of lipids: Lab Results  Component Value Date   CHOL 116 11/18/2017   HDL 30 (L) 11/18/2017   LDLCALC 51 11/18/2017   LDLDIRECT 61.0 03/15/2017   TRIG 177 (H) 11/18/2017   CHOLHDL 6 03/15/2017  On Zocor, Vascepa - last eye exam was in 05/2017: No [facility]. [facility].+ cataracts - + numbness and tingling in his feet - [place] [facility]). Dr. Elvina Mattes.  He had surgery for PVD stents on 04/27/2014. His sugars improved after the stent.   He he also has a history of a thyroid nodule: 0.9 x 0.7 x 1.3 cm: Solid, hypoechoic.   He had an FNA in 05/2017: Benign  ROS: Constitutional: no weight gain/no weight loss, no fatigue, no subjective hyperthermia, + subjective hypothermia Eyes: no blurry vision, no xerophthalmia ENT: no sore throat, no nodules palpated in throat, no dysphagia, no odynophagia, no hoarseness Cardiovascular: no CP/no SOB/no palpitations/no leg swelling Respiratory: no cough/no SOB/+ wheezing Gastrointestinal: no N/no V/no D/+ C/no acid reflux Musculoskeletal: no muscle aches/+ joint aches Skin: no rashes, no hair loss Neurological: + tremors/+ numbness/+ tingling/no dizziness  I reviewed pt's medications, allergies, PMH, social hx, family hx, and changes were documented in  the history of present illness. Otherwise, unchanged from my initial visit note.   PE: BP (!) 142/84   Pulse 81   Ht 5\' 8"  (1.727 m)   Wt 252 lb 6.4 oz (114.5 kg)   SpO2 99%   BMI 38.38 kg/m  Body mass index is 38.38 kg/m. Wt Readings from Last 3 Encounters:  12/10/17 252 lb 6.4 oz (114.5 kg)  12/09/17 256 lb (116.1 kg)  11/18/17 257 lb 6 oz (116.7 kg)   Constitutional: overweight, in NAD Eyes: PERRLA, EOMI, no exophthalmos ENT: moist mucous membranes, no thyromegaly, no cervical lymphadenopathy Cardiovascular: RRR, No RG, , + 3/6 SEM Respiratory: CTA  B Gastrointestinal: abdomen soft, NT, ND, BS+ Musculoskeletal: no deformities, strength intact in all 4 Skin: moist, warm, no rashes Neurological: no tremor with outstretched hands, DTR normal in all 4   ASSESSMENT: 1. DM2, insulin-dependent, now controlled, with complications - CAD - s/p CABG 2012 - PVD - s/p stents 2012 - Aortic stenosis - carotid stenosis - CKD - peripheral neuropathy  - He read Dr Janene Harvey book: Program for Reversing Diabetes".  2. R Thyroid nodule  3. HL  PLAN:  1. Patient with long-standing, uncontrolled, type 2 diabetes, now mostly diet controlled, with only rare NPH and regular insulin dosing.  He takes 10 units of each as needed.  He improved his diet before last visit, but since then, started to relax it more.  He has a little more higher blood sugar spikes after meals, in the 190s and 200s.  Otherwise, sugars are at goal. - No changes are needed in his regimen, he can continue with the N and R insulin as needed - I advised him to Patient Instructions  Please use as needed: N 10 units and R 10 units as needed.  Please come back for a follow-up appointment in 6 months.  - today, HbA1c is 5.9% (stable, great!) - continue checking sugars at different times of the day - check 1x a day, rotating checks - advised for yearly eye exams >> he is UTD - Return to clinic in 6 mo with sugar log   2. R Thyroid nodule - not very large, but hypoechoic and solid - reviewed recent bx result >> benign - I reassured the patient that this nodule is not problematic and we can just continue to follow it clinically - No further investigation needed as no neck compression sxs  3. HL - Reviewed latest lipid panel from 10/2017, excellent LDL, low HDL, high triglycerides - Continues Zocor and Vascepa without side effects.   Philemon Kingdom, MD PhD [facility] Endocrinology

## 2017-12-10 NOTE — Patient Instructions (Addendum)
Please use as needed: N 10 units and R 10 units as needed.  Please come back for a follow-up appointment in 6 months.

## 2017-12-11 ENCOUNTER — Ambulatory Visit (INDEPENDENT_AMBULATORY_CARE_PROVIDER_SITE_OTHER): Payer: Medicare Other | Admitting: Psychology

## 2017-12-11 DIAGNOSIS — F4323 Adjustment disorder with mixed anxiety and depressed mood: Secondary | ICD-10-CM

## 2017-12-11 DIAGNOSIS — F4312 Post-traumatic stress disorder, chronic: Secondary | ICD-10-CM | POA: Diagnosis not present

## 2017-12-11 DIAGNOSIS — I10 Essential (primary) hypertension: Secondary | ICD-10-CM | POA: Diagnosis not present

## 2017-12-11 DIAGNOSIS — R809 Proteinuria, unspecified: Secondary | ICD-10-CM | POA: Diagnosis not present

## 2017-12-11 DIAGNOSIS — E1122 Type 2 diabetes mellitus with diabetic chronic kidney disease: Secondary | ICD-10-CM | POA: Diagnosis not present

## 2017-12-11 DIAGNOSIS — D631 Anemia in chronic kidney disease: Secondary | ICD-10-CM | POA: Diagnosis not present

## 2017-12-11 DIAGNOSIS — B182 Chronic viral hepatitis C: Secondary | ICD-10-CM | POA: Diagnosis not present

## 2017-12-11 DIAGNOSIS — E785 Hyperlipidemia, unspecified: Secondary | ICD-10-CM | POA: Diagnosis not present

## 2017-12-11 DIAGNOSIS — N183 Chronic kidney disease, stage 3 (moderate): Secondary | ICD-10-CM | POA: Diagnosis not present

## 2017-12-12 ENCOUNTER — Encounter: Payer: Self-pay | Admitting: General Surgery

## 2017-12-12 ENCOUNTER — Ambulatory Visit: Payer: Self-pay

## 2017-12-12 ENCOUNTER — Ambulatory Visit (INDEPENDENT_AMBULATORY_CARE_PROVIDER_SITE_OTHER): Payer: Medicare Other | Admitting: General Surgery

## 2017-12-12 VITALS — BP 126/82 | HR 83 | Resp 14 | Ht 68.0 in | Wt 251.0 lb

## 2017-12-12 DIAGNOSIS — E041 Nontoxic single thyroid nodule: Secondary | ICD-10-CM | POA: Diagnosis not present

## 2017-12-12 DIAGNOSIS — I6529 Occlusion and stenosis of unspecified carotid artery: Secondary | ICD-10-CM

## 2017-12-12 NOTE — Progress Notes (Signed)
Patient ID: [name] [name], male   DOB: 19-Mar-1956, 62 y.o.   MRN: [id number]  Chief Complaint  Patient presents with  . Follow-up    HPI [name] [name] is a 62 y.o. male here today for his follow up thyroid ultrasound. He still has trouble swallowing some solids and liquids, this is been going on for years.  He has had a barium swallow and upper endoscopy in 2016 to assess.  No significant interval change.  Patient underwent FNA sampling 6 months ago with findings of a Bethesda 2 lesion in the right lobe of the thyroid.  HPI  Past Medical History:  Diagnosis Date  . Adenomatous colon polyp   . Allergy   . Angina at rest [facility])    chronic  . Anxiety   . Arthritis    RA  . Bell's palsy 2007  . Carotid artery occlusion   . Cataract    Dr. Dawna Part  . Coronary artery disease   . Depression   . Diabetes mellitus   . Diverticulosis   . Duodenitis   . DVT (deep venous thrombosis) (HCC)    in leg  . Dyspnea    DOE  . Gastropathy   . GERD (gastroesophageal reflux disease)   . Heart murmur   . Helicobacter pylori gastritis   . Hyperlipidemia   . Hypertension   . Mild aortic stenosis   . Neuropathy   . Obesity   . Peripheral vascular disease ([place])   . Post splenectomy syndrome   . Psoriasis   . Psoriatic arthritis ([place])   . Sleep apnea    uses cpap  . SOB (shortness of breath)   . Stroke ([place])   . Tobacco use disorder    recently quit  . Tubular adenoma 08/2013   Dr. Hilarie Fredrickson  . Weak urinary stream     Past Surgical History:  Procedure Laterality Date  . CARDIAC CATHETERIZATION  08/2010   LAD: 80% ISR, RCA: 80% ostial, OM 80-90%  . CAROTID ENDARTERECTOMY  08/2010   left/ Dr. Kellie Simmering  . CATARACT EXTRACTION W/PHACO Left 07/09/2017   Procedure: CATARACT EXTRACTION PHACO AND INTRAOCULAR LENS PLACEMENT (IOC);  Surgeon: Birder Robson, MD;  Location: [facility] ORS;  Service: Ophthalmology;  Laterality: Left;  [place] 00:23 AP% 14.2 CDE 3.26 Fluid pack lot # [id number]  .  CATARACT EXTRACTION W/PHACO Right 09/10/2017   Procedure: CATARACT EXTRACTION PHACO AND INTRAOCULAR LENS PLACEMENT (IOC);  Surgeon: Birder Robson, MD;  Location: [facility] ORS;  Service: Ophthalmology;  Laterality: Right;  [place] 00:26.4 AP% 17.3 CDE 4.59 Fluid Pack Lot # [id number]  . COLONOSCOPY    . CORONARY ANGIOPLASTY     LAD: before CABG  . CORONARY ARTERY BYPASS GRAFT  09/06/2010   At [facility]: LIMA to LAD, left radial to RCA, sequential SVG to OM3 and 4  . heart stents  Jan 2011   leg stents 06/2009 and 03/2010  . PTA of illiac and SFA  multiple   Dr. Ronalee Belts, s/p revision 11/2012  . SPLENECTOMY    . VASCULAR SURGERY     LEG STENTS    Family History  Problem Relation Age of Onset  . Lung cancer Father 64  . Arthritis Father   . Breast cancer Sister 49  . Alcoholism Sister   . Dementia Mother   . Alzheimer's disease Mother   . Arthritis Mother   . Alcoholism Brother   . Epilepsy Sister   . Diabetes Paternal Grandfather   . Colon polyps  Paternal Grandfather 33  . Alcoholism Sister   . Alcoholism Sister   . Alcoholism Brother     Social History Social History   Tobacco Use  . Smoking status: Former Smoker    Packs/day: 0.75    Years: 40.00    Pack years: 30.00    Types: Cigarettes    Last attempt to quit: 04/15/2017    Years since quitting: 0.6  . Smokeless tobacco: Never Used  . Tobacco comment: 1 cigarette a day  Substance Use Topics  . Alcohol use: No  . Drug use: No    Allergies  Allergen Reactions  . Contrast Media [Iodinated Diagnostic Agents] Rash and Other (See Comments)    Got very hot and red   . Glipizide Other (See Comments)    ANTIDIABETICS. Burning  . Metrizamide Other (See Comments)    Got very hot and red  . Penicillins Hives and Swelling    Has patient had a PCN reaction causing immediate rash, facial/tongue/throat swelling, SOB or lightheadedness with hypotension: yes Has patient had a PCN reaction causing severe rash involving mucus membranes  or skin necrosis: no  Has patient had a PCN reaction that required hospitalization: yes Has patient had a PCN reaction occurring within the last 10 years: no If all of the above answers are "NO", then may proceed with Cephalosporin use.     Current Outpatient Medications  Medication Sig Dispense Refill  . ACCU-CHEK FASTCLIX LANCETS MISC Use to check sugar 6 times daily. 600 each 5  . albuterol (PROAIR HFA) 108 (90 Base) MCG/ACT inhaler Inhale 2 puffs into the lungs every 6 (six) hours as needed for shortness of breath. 1 Inhaler 1  . aspirin 81 MG tablet Take 81 mg by mouth daily.    [name] BAYER CONTOUR TEST test strip TEST THREE TIMES DAILY AS DIRECTED 300 each 1  . buPROPion (WELLBUTRIN XL) 300 MG 24 hr tablet Take 1 tablet (300 mg total) by mouth daily. 90 tablet 1  . busPIRone (BUSPAR) 10 MG tablet Take 2 tablets (20 mg total) by mouth 2 (two) times daily. 360 tablet 1  . calcitRIOL (ROCALTROL) 0.25 MCG capsule Take 0.25 mcg by mouth 3 (three) times a week.    . carvedilol (COREG) 12.5 MG tablet TAKE 1 TABLET BY MOUTH TWICE DAILY 60 tablet 0  . Cholecalciferol (VITAMIN D PO) Take 1 capsule by mouth daily.     . clopidogrel (PLAVIX) 75 MG tablet TAKE 1 TABLET BY MOUTH ONCE DAILY 90 tablet 2  . cyclobenzaprine (FLEXERIL) 10 MG tablet take 1 tablet by mouth three times a day if needed for muscle spasm 30 tablet 0  . DULoxetine (CYMBALTA) 30 MG capsule Take 1 capsule (30 mg total) by mouth daily. 30 capsule 3  . finasteride (PROSCAR) 5 MG tablet Take 5 mg by mouth daily.    . furosemide (LASIX) 20 MG tablet Take 20 mg by mouth daily.    Vanessa Kick Ethyl (VASCEPA) 1 g CAPS Take 2 capsules (2 g total) by mouth 2 (two) times daily. 120 capsule 5  . InFLIXimab (REMICADE IV) Inject into the vein every 6 (six) weeks.    . insulin NPH Human (NOVOLIN N RELION) 100 UNIT/ML injection Inject 40 units in am and 20 units in the evening (Patient taking differently: Inject 20 Units into the skin daily as  needed (High Blood Sugar). ) 40 mL 2  . insulin regular (NOVOLIN R,HUMULIN R) 100 units/mL injection Inject underskin 10 units regular  insulin as needed 40 mL 5  . ketoconazole (NIZORAL) 2 % cream Apply topically 2 (two) times daily. For rash 15 g 0  . losartan (COZAAR) 100 MG tablet Take 1 tablet (100 mg total) by mouth daily. 30 tablet 3  . MAGNESIUM PO Take 400 mg by mouth daily.     . nitroGLYCERIN (NITROSTAT) 0.4 MG SL tablet Place 1 tablet (0.4 mg total) under the tongue every 5 (five) minutes as needed. 25 tablet 1  . omeprazole (PRILOSEC) 20 MG capsule Take 1 capsule (20 mg total) by mouth 2 (two) times daily. (Patient taking differently: Take 20 mg by mouth 2 (two) times daily as needed (HEARTBURN). ) 20 capsule 0  . sertraline (ZOLOFT) 100 MG tablet Take 2 tablets (200 mg total) by mouth daily. (Patient taking differently: Take 150 mg by mouth daily. ) 180 tablet 1  . simvastatin (ZOCOR) 20 MG tablet TAKE 1 TABLET BY MOUTH ONCE DAILY AT 6PM 90 tablet 1  . tamsulosin (FLOMAX) 0.4 MG CAPS capsule take 2 capsule by mouth daily (Patient taking differently: Take 0.4 mg by mouth 2 (two) times daily. ) 60 capsule 11   No current facility-administered medications for this visit.     Review of Systems Review of Systems  Constitutional: Negative.   HENT: Positive for trouble swallowing.   Respiratory: Negative.   Cardiovascular: Negative.     Blood pressure 126/82, pulse 83, resp. rate 14, height 5\' 8"  (1.727 m), weight 251 lb (113.9 kg), SpO2 97 %.  Physical Exam Physical Exam  Constitutional: He is oriented to person, place, and time. He appears well-developed and well-nourished.  Eyes: No scleral icterus.  Neck:    Cardiovascular: Normal rate and regular rhythm.  Murmur heard.  Systolic murmur is present with a grade of 2/6. Pulmonary/Chest: Effort normal and breath sounds normal.  Neurological: He is alert and oriented to person, place, and time.  Skin: Skin is warm and dry.     Data Reviewed Ultrasound examination of the right lobe of the thyroid was completed to reassess the previously evaluated nodule.  The overall size of the nodule is unchanged measuring 1.15 x 1.19 x 1.29.  Maximum diameter recorded time of the June 10, 2017 FNA sampling was 1.61 cm.  Overall the thyroid gland measures 1.  0.7 x 1.8 x 5.2 cm.  No additional nodules are noted.  June 10, 2018 FNA: Diagnosis  CONSISTENT WITH BENIGN FOLLICULAR NODULE (BETHESDA CATEGORY II).  Assessment    Stable right lobe thyroid nodule.    Plan  Return in one year.  If the exam is unchanged at that time he will be released to as needed follow-up.  The patient is aware to call back for any questions or concerns.   HPI, Physical Exam, Assessment and Plan have been scribed under the direction and in the presence of Hervey Ard, MD.  Gaspar Cola, CMA  I have completed the exam and reviewed the above documentation for accuracy and completeness.  I agree with the above.  Haematologist has been used and any errors in dictation or transcription are unintentional.  Hervey Ard, M.D., F.A.C.S.  Forest Gleason Nadia Torr 12/12/2017, 8:24 PM

## 2017-12-12 NOTE — Patient Instructions (Signed)
Return in one year. The patient is aware to call back for any questions or concerns.  

## 2017-12-13 ENCOUNTER — Other Ambulatory Visit
Admission: RE | Admit: 2017-12-13 | Discharge: 2017-12-13 | Disposition: A | Discharge status: Home / Self Care | Payer: Medicare Other | Source: Ambulatory Visit | Attending: Cardiovascular Disease | Admitting: Cardiovascular Disease

## 2017-12-13 DIAGNOSIS — E7800 Pure hypercholesterolemia, unspecified: Secondary | ICD-10-CM

## 2017-12-13 LAB — HEPATIC FUNCTION PANEL
ALT: 16 U/L — ABNORMAL LOW (ref 17–63)
AST: 16 U/L (ref 15–41)
Albumin: 3.8 g/dL (ref 3.5–5.0)
Alkaline Phosphatase: 79 U/L (ref 38–126)
Bilirubin, Direct: <0.1 mg/dL — ABNORMAL LOW (ref 0.1–0.5)
Total Bilirubin: 0.6 mg/dL (ref 0.3–1.2)
Total Protein: 7.4 g/dL (ref 6.5–8.1)

## 2017-12-13 LAB — LIPID PANEL
Cholesterol: 137 mg/dL (ref 0–200)
HDL: 24 mg/dL — ABNORMAL LOW (ref >40)
LDL Cholesterol: 59 mg/dL (ref 0–99)
Total CHOL/HDL Ratio: 5.7 RATIO
Triglycerides: 271 mg/dL — ABNORMAL HIGH (ref <150)
VLDL: 54 mg/dL — ABNORMAL HIGH (ref 0–40)

## 2017-12-17 DIAGNOSIS — M0579 Rheumatoid arthritis with rheumatoid factor of multiple sites without organ or systems involvement: Secondary | ICD-10-CM | POA: Diagnosis not present

## 2017-12-17 DIAGNOSIS — Z79899 Other long term (current) drug therapy: Secondary | ICD-10-CM | POA: Diagnosis not present

## 2017-12-18 ENCOUNTER — Ambulatory Visit (INDEPENDENT_AMBULATORY_CARE_PROVIDER_SITE_OTHER): Payer: Medicare Other | Admitting: Psychology

## 2017-12-18 DIAGNOSIS — F4323 Adjustment disorder with mixed anxiety and depressed mood: Secondary | ICD-10-CM

## 2017-12-18 DIAGNOSIS — F4312 Post-traumatic stress disorder, chronic: Secondary | ICD-10-CM | POA: Diagnosis not present

## 2017-12-19 ENCOUNTER — Other Ambulatory Visit: Payer: Self-pay | Admitting: Cardiovascular Disease

## 2017-12-20 ENCOUNTER — Other Ambulatory Visit: Payer: Self-pay | Admitting: *Deleted

## 2017-12-20 MED ORDER — ICOSAPENT ETHYL 1 G PO CAPS
2.0000 g | ORAL_CAPSULE | Freq: Every day | ORAL | 5 refills | Status: DC
Start: 2017-12-20 — End: 2019-11-03

## 2017-12-24 DIAGNOSIS — B351 Tinea unguium: Secondary | ICD-10-CM | POA: Diagnosis not present

## 2017-12-24 DIAGNOSIS — E1142 Type 2 diabetes mellitus with diabetic polyneuropathy: Secondary | ICD-10-CM | POA: Diagnosis not present

## 2017-12-24 DIAGNOSIS — L851 Acquired keratosis [keratoderma] palmaris et plantaris: Secondary | ICD-10-CM | POA: Diagnosis not present

## 2017-12-25 ENCOUNTER — Ambulatory Visit (INDEPENDENT_AMBULATORY_CARE_PROVIDER_SITE_OTHER): Payer: Medicare Other | Admitting: Internal Medicine

## 2017-12-25 ENCOUNTER — Encounter: Payer: Self-pay | Admitting: Internal Medicine

## 2017-12-25 VITALS — BP 122/84 | HR 70 | Temp 97.6°F | Ht 68.0 in | Wt 259.0 lb

## 2017-12-25 DIAGNOSIS — F331 Major depressive disorder, recurrent, moderate: Secondary | ICD-10-CM | POA: Diagnosis not present

## 2017-12-25 DIAGNOSIS — I6529 Occlusion and stenosis of unspecified carotid artery: Secondary | ICD-10-CM | POA: Diagnosis not present

## 2017-12-25 MED ORDER — SERTRALINE HCL 100 MG PO TABS: 200.0000 mg | ORAL_TABLET | Freq: Every day | ORAL | 3 refills | Status: DC

## 2017-12-25 MED ORDER — BUPROPION HCL ER (XL) 300 MG PO TB24: 300.0000 mg | ORAL_TABLET | Freq: Every day | ORAL | 3 refills | Status: DC

## 2017-12-25 MED ORDER — BUSPIRONE HCL 10 MG PO TABS: 20.0000 mg | ORAL_TABLET | Freq: Two times a day (BID) | ORAL | 3 refills | Status: DC

## 2017-12-25 NOTE — Progress Notes (Signed)
Subjective:    Patient ID: [name] [name], male    DOB: 03-Feb-1956, 62 y.o.   MRN: [id number]  HPI Here for follow up of depression  "that medicine really worked" He is much calmer  He feels that the trouble remembering things and balance issues are related to depression This seems to be   Recent A1c all the way down to 5.9%  Current Outpatient Medications on File Prior to Visit  Medication Sig Dispense Refill  . ACCU-CHEK FASTCLIX LANCETS MISC Use to check sugar 6 times daily. 600 each 5  . albuterol (PROAIR HFA) 108 (90 Base) MCG/ACT inhaler Inhale 2 puffs into the lungs every 6 (six) hours as needed for shortness of breath. 1 Inhaler 1  . aspirin 81 MG tablet Take 81 mg by mouth daily.    [name] BAYER CONTOUR TEST test strip TEST THREE TIMES DAILY AS DIRECTED 300 each 1  . buPROPion (WELLBUTRIN XL) 300 MG 24 hr tablet Take 1 tablet (300 mg total) by mouth daily. 90 tablet 1  . busPIRone (BUSPAR) 10 MG tablet Take 2 tablets (20 mg total) by mouth 2 (two) times daily. 360 tablet 1  . calcitRIOL (ROCALTROL) 0.25 MCG capsule Take 0.25 mcg by mouth 3 (three) times a week.    . carvedilol (COREG) 12.5 MG tablet TAKE 1 TABLET BY MOUTH TWICE DAILY 60 tablet 5  . Cholecalciferol (VITAMIN D PO) Take 1 capsule by mouth daily.     . clopidogrel (PLAVIX) 75 MG tablet TAKE 1 TABLET BY MOUTH ONCE DAILY 90 tablet 2  . cyclobenzaprine (FLEXERIL) 10 MG tablet take 1 tablet by mouth three times a day if needed for muscle spasm 30 tablet 0  . DULoxetine (CYMBALTA) 30 MG capsule Take 1 capsule (30 mg total) by mouth daily. 30 capsule 3  . finasteride (PROSCAR) 5 MG tablet Take 5 mg by mouth daily.    . furosemide (LASIX) 20 MG tablet Take 20 mg by mouth daily.    Vanessa Kick Ethyl (VASCEPA) 1 g CAPS Take 2 capsules (2 g total) by mouth daily. 120 capsule 5  . InFLIXimab (REMICADE IV) Inject into the vein every 6 (six) weeks.    . insulin NPH Human (NOVOLIN N RELION) 100 UNIT/ML injection Inject 40 units  in am and 20 units in the evening (Patient taking differently: Inject 20 Units into the skin daily as needed (High Blood Sugar). ) 40 mL 2  . insulin regular (NOVOLIN R,HUMULIN R) 100 units/mL injection Inject underskin 10 units regular insulin as needed 40 mL 5  . ketoconazole (NIZORAL) 2 % cream Apply topically 2 (two) times daily. For rash 15 g 0  . losartan (COZAAR) 100 MG tablet Take 1 tablet (100 mg total) by mouth daily. 30 tablet 3  . MAGNESIUM PO Take 400 mg by mouth daily.     . nitroGLYCERIN (NITROSTAT) 0.4 MG SL tablet Place 1 tablet (0.4 mg total) under the tongue every 5 (five) minutes as needed. 25 tablet 1  . omeprazole (PRILOSEC) 20 MG capsule Take 1 capsule (20 mg total) by mouth 2 (two) times daily. (Patient taking differently: Take 20 mg by mouth 2 (two) times daily as needed (HEARTBURN). ) 20 capsule 0  . sertraline (ZOLOFT) 100 MG tablet Take 2 tablets (200 mg total) by mouth daily. (Patient taking differently: Take 150 mg by mouth daily. ) 180 tablet 1  . simvastatin (ZOCOR) 20 MG tablet TAKE 1 TABLET BY MOUTH ONCE DAILY AT 6PM 90  tablet 1  . tamsulosin (FLOMAX) 0.4 MG CAPS capsule take 2 capsule by mouth daily (Patient taking differently: Take 0.4 mg by mouth 2 (two) times daily. ) 60 capsule 11   No current facility-administered medications on file prior to visit.     Allergies  Allergen Reactions  . Contrast Media [Iodinated Diagnostic Agents] Rash and Other (See Comments)    Got very hot and red   . Glipizide Other (See Comments)    ANTIDIABETICS. Burning  . Metrizamide Other (See Comments)    Got very hot and red  . Penicillins Hives and Swelling    Has patient had a PCN reaction causing immediate rash, facial/tongue/throat swelling, SOB or lightheadedness with hypotension: yes Has patient had a PCN reaction causing severe rash involving mucus membranes or skin necrosis: no  Has patient had a PCN reaction that required hospitalization: yes Has patient had a PCN  reaction occurring within the last 10 years: no If all of the above answers are "NO", then may proceed with Cephalosporin use.     Past Medical History:  Diagnosis Date  . Adenomatous colon polyp   . Allergy   . Angina at rest [facility])    chronic  . Anxiety   . Arthritis    RA  . Bell's palsy 2007  . Carotid artery occlusion   . Cataract    Dr. Dawna Part  . Coronary artery disease   . Depression   . Diabetes mellitus   . Diverticulosis   . Duodenitis   . DVT (deep venous thrombosis) (HCC)    in leg  . Dyspnea    DOE  . Gastropathy   . GERD (gastroesophageal reflux disease)   . Heart murmur   . Helicobacter pylori gastritis   . Hyperlipidemia   . Hypertension   . Mild aortic stenosis   . Neuropathy   . Obesity   . Peripheral vascular disease ([place])   . Post splenectomy syndrome   . Psoriasis   . Psoriatic arthritis ([place])   . Sleep apnea    uses cpap  . SOB (shortness of breath)   . Stroke ([place])   . Tobacco use disorder    recently quit  . Tubular adenoma 08/2013   Dr. Hilarie Fredrickson  . Weak urinary stream     Past Surgical History:  Procedure Laterality Date  . CARDIAC CATHETERIZATION  08/2010   LAD: 80% ISR, RCA: 80% ostial, OM 80-90%  . CAROTID ENDARTERECTOMY  08/2010   left/ Dr. Kellie Simmering  . CATARACT EXTRACTION W/PHACO Left 07/09/2017   Procedure: CATARACT EXTRACTION PHACO AND INTRAOCULAR LENS PLACEMENT (IOC);  Surgeon: Birder Robson, MD;  Location: [facility] ORS;  Service: Ophthalmology;  Laterality: Left;  [place] 00:23 AP% 14.2 CDE 3.26 Fluid pack lot # [id number]  . CATARACT EXTRACTION W/PHACO Right 09/10/2017   Procedure: CATARACT EXTRACTION PHACO AND INTRAOCULAR LENS PLACEMENT (IOC);  Surgeon: Birder Robson, MD;  Location: [facility] ORS;  Service: Ophthalmology;  Laterality: Right;  [place] 00:26.4 AP% 17.3 CDE 4.59 Fluid Pack Lot # [id number]  . COLONOSCOPY    . CORONARY ANGIOPLASTY     LAD: before CABG  . CORONARY ARTERY BYPASS GRAFT  09/06/2010   At [facility]: LIMA to LAD,  left radial to RCA, sequential SVG to OM3 and 4  . heart stents  Jan 2011   leg stents 06/2009 and 03/2010  . PTA of illiac and SFA  multiple   Dr. Ronalee Belts, s/p revision 11/2012  . SPLENECTOMY    . VASCULAR  SURGERY     LEG STENTS    Family History  Problem Relation Age of Onset  . Lung cancer Father 75  . Arthritis Father   . Breast cancer Sister 80  . Alcoholism Sister   . Dementia Mother   . Alzheimer's disease Mother   . Arthritis Mother   . Alcoholism Brother   . Epilepsy Sister   . Diabetes Paternal Grandfather   . Colon polyps Paternal Grandfather 59  . Alcoholism Sister   . Alcoholism Sister   . Alcoholism Brother     Social History   Socioeconomic History  . Marital status: Married    Spouse name: Not on file  . Number of children: 0  . Years of education: Not on file  . Highest education level: Not on file  Occupational History  . Occupation: Chief Operating Officer at [employer]: Disabled mostly due to neuropathy  Social Needs  . Financial resource strain: Not on file  . Food insecurity:    Worry: Not on file    Inability: Not on file  . Transportation needs:    Medical: Not on file    Non-medical: Not on file  Tobacco Use  . Smoking status: Former Smoker    Packs/day: 0.75    Years: 40.00    Pack years: 30.00    Types: Cigarettes    Last attempt to quit: 04/15/2017    Years since quitting: 0.6  . Smokeless tobacco: Never Used  . Tobacco comment: 1 cigarette a day  Substance and Sexual Activity  . Alcohol use: No  . Drug use: No  . Sexual activity: Never  Lifestyle  . Physical activity:    Days per week: Not on file    Minutes per session: Not on file  . Stress: Not on file  Relationships  . Social connections:    Talks on phone: Not on file    Gets together: Not on file    Attends religious service: Not on file    Active member of club or organization: Not on file    Attends meetings of clubs or organizations: Not on file    Relationship  status: Not on file  . Intimate partner violence:    Fear of current or ex partner: Not on file    Emotionally abused: Not on file    Physically abused: Not on file    Forced sexual activity: Not on file  Other Topics Concern  . Not on file  Social History Narrative   Has living will   Wife is health care POA---brother Ronalee Belts is alternate   Would accept resuscitation --but no prolonged ventilation   No tube feeds if cognitively unaware   Review of Systems Appetite is good Sleeping okay Now wearing fitbit watch--now enjoying walking more    Objective:   Physical Exam  Constitutional: He appears well-developed. No distress.  Psychiatric: He has a normal mood and affect. His behavior is normal.           Assessment & Plan:

## 2017-12-25 NOTE — Patient Instructions (Signed)
Please let me know if the duloxetine seems to stop working or not working as well. I will need to increase the dose for you.

## 2017-12-25 NOTE — Assessment & Plan Note (Signed)
Has had great initial response to the duloxetine Discussed possible need to increase med---no change for now Will see back in 6 weeks

## 2018-01-01 ENCOUNTER — Ambulatory Visit (INDEPENDENT_AMBULATORY_CARE_PROVIDER_SITE_OTHER): Payer: Medicare Other | Admitting: Psychology

## 2018-01-01 DIAGNOSIS — F4323 Adjustment disorder with mixed anxiety and depressed mood: Secondary | ICD-10-CM | POA: Diagnosis not present

## 2018-01-01 DIAGNOSIS — F4312 Post-traumatic stress disorder, chronic: Secondary | ICD-10-CM

## 2018-01-02 ENCOUNTER — Ambulatory Visit: Payer: Self-pay | Admitting: Neurology

## 2018-01-02 ENCOUNTER — Encounter

## 2018-01-08 ENCOUNTER — Ambulatory Visit (INDEPENDENT_AMBULATORY_CARE_PROVIDER_SITE_OTHER): Payer: Medicare Other | Admitting: Psychology

## 2018-01-08 DIAGNOSIS — F4323 Adjustment disorder with mixed anxiety and depressed mood: Secondary | ICD-10-CM

## 2018-01-08 DIAGNOSIS — F4312 Post-traumatic stress disorder, chronic: Secondary | ICD-10-CM | POA: Diagnosis not present

## 2018-01-15 ENCOUNTER — Ambulatory Visit: Payer: Medicare Other | Admitting: Psychology

## 2018-01-20 ENCOUNTER — Other Ambulatory Visit: Payer: Self-pay

## 2018-01-20 ENCOUNTER — Other Ambulatory Visit: Payer: Self-pay | Admitting: Internal Medicine

## 2018-01-20 MED ORDER — INSULIN REGULAR HUMAN 100 UNIT/ML IJ SOLN: INTRAMUSCULAR | 5 refills | Status: DC

## 2018-01-22 ENCOUNTER — Ambulatory Visit (INDEPENDENT_AMBULATORY_CARE_PROVIDER_SITE_OTHER): Payer: Medicare Other | Admitting: Psychology

## 2018-01-22 DIAGNOSIS — F4323 Adjustment disorder with mixed anxiety and depressed mood: Secondary | ICD-10-CM | POA: Diagnosis not present

## 2018-01-22 DIAGNOSIS — F4312 Post-traumatic stress disorder, chronic: Secondary | ICD-10-CM

## 2018-01-29 ENCOUNTER — Ambulatory Visit: Payer: Medicare Other | Admitting: Psychology

## 2018-02-03 ENCOUNTER — Other Ambulatory Visit: Payer: Self-pay | Admitting: Internal Medicine

## 2018-02-04 ENCOUNTER — Ambulatory Visit: Payer: Medicare Other | Admitting: Psychiatry

## 2018-02-04 DIAGNOSIS — M0579 Rheumatoid arthritis with rheumatoid factor of multiple sites without organ or systems involvement: Secondary | ICD-10-CM | POA: Diagnosis not present

## 2018-02-05 ENCOUNTER — Ambulatory Visit: Payer: Medicare Other | Admitting: Psychology

## 2018-02-10 ENCOUNTER — Other Ambulatory Visit: Payer: Self-pay | Admitting: Family Medicine

## 2018-02-10 ENCOUNTER — Telehealth: Payer: Self-pay | Admitting: Internal Medicine

## 2018-02-10 MED ORDER — DULOXETINE HCL 60 MG PO CPEP: 60.0000 mg | ORAL_CAPSULE | Freq: Every day | ORAL | 1 refills | Status: DC

## 2018-02-10 NOTE — Telephone Encounter (Signed)
Copied from [place] [phone]. Topic: Quick Communication - See Telephone Encounter >> Feb 10, 2018  8:39 AM Ahmed Prima L wrote: CRM for notification. See Telephone encounter for: 02/10/18.  Patient said that Dr [name] put him on DULoxetine (CYMBALTA) 30 MG capsule and said that he could increase it if it felt that it was needed. Patient would like that to be increased to higher dosage.   [facility] [place], [place]

## 2018-02-10 NOTE — Telephone Encounter (Signed)
I increased it and sent to his [facility] after reviewing note from PCP   Alert [place] if any problems F/u with Dr [name] as planned

## 2018-02-10 NOTE — Telephone Encounter (Signed)
Pt last seen 12/25/17 and has f/u appt with Dr [name] on 02/18/18. Dr [name] is out of office.Please advise.

## 2018-02-11 NOTE — Telephone Encounter (Signed)
thanks

## 2018-02-12 ENCOUNTER — Ambulatory Visit (INDEPENDENT_AMBULATORY_CARE_PROVIDER_SITE_OTHER): Payer: Medicare Other | Admitting: Psychology

## 2018-02-12 DIAGNOSIS — F4323 Adjustment disorder with mixed anxiety and depressed mood: Secondary | ICD-10-CM | POA: Diagnosis not present

## 2018-02-12 DIAGNOSIS — F4312 Post-traumatic stress disorder, chronic: Secondary | ICD-10-CM

## 2018-02-18 ENCOUNTER — Ambulatory Visit (INDEPENDENT_AMBULATORY_CARE_PROVIDER_SITE_OTHER): Payer: Medicare Other | Admitting: Internal Medicine

## 2018-02-18 ENCOUNTER — Encounter: Payer: Self-pay | Admitting: Internal Medicine

## 2018-02-18 VITALS — BP 132/82 | HR 81 | Temp 97.9°F | Ht 68.0 in | Wt 259.0 lb

## 2018-02-18 DIAGNOSIS — F331 Major depressive disorder, recurrent, moderate: Secondary | ICD-10-CM | POA: Diagnosis not present

## 2018-02-18 DIAGNOSIS — K5900 Constipation, unspecified: Secondary | ICD-10-CM | POA: Diagnosis not present

## 2018-02-18 DIAGNOSIS — I6529 Occlusion and stenosis of unspecified carotid artery: Secondary | ICD-10-CM | POA: Diagnosis not present

## 2018-02-18 NOTE — Patient Instructions (Signed)
Please start miralax, a full capful with 8 ounces of water, every day. Also take senna-s 2 tabs daily. If you are not moving your bowels regularly within 4-5 days, increase the miralax to twice a day.

## 2018-02-18 NOTE — Assessment & Plan Note (Signed)
Fairly good response to the duloxetine addition--then it waned Now increased to 60mg  Would even consider increase to 90mg  if apparent tachyphylaxis

## 2018-02-18 NOTE — Assessment & Plan Note (Signed)
Will give instructions for this Has gone back for many years

## 2018-02-18 NOTE — Progress Notes (Signed)
Subjective:    Patient ID: [name] [name], male    DOB: [birth year], 62 y.o.   MRN: [id number]  HPI Here for follow up of depression and neuropathy  He did notice a worsening of his mood Last week, duloxetine was increased He thinks it is too soon to tell---but he is not bad like he was before Memory is some better since the depression is better controlled  Worsening problems with constipation Sugarless candy had helped in the past--but not working now Thinks it may be medication related (?neuropathic as well) Tried metamucil, benefiber, ex lax, correctol and even castor oil---nothing really helped  Ongoing neuropathy pain---worse in extremes of temperature Feet mostly Left foot will shake at night  Current Outpatient Medications on File Prior to Visit  Medication Sig Dispense Refill  . ACCU-CHEK FASTCLIX LANCETS MISC Use to check sugar 6 times daily. 600 each 5  . albuterol (PROAIR HFA) 108 (90 Base) MCG/ACT inhaler Inhale 2 puffs into the lungs every 6 (six) hours as needed for shortness of breath. 1 Inhaler 1  . aspirin 81 MG tablet Take 81 mg by mouth daily.    [name] BAYER CONTOUR TEST test strip TEST THREE TIMES DAILY AS DIRECTED 300 each 1  . buPROPion (WELLBUTRIN XL) 300 MG 24 hr tablet Take 1 tablet (300 mg total) by mouth daily. 90 tablet 3  . busPIRone (BUSPAR) 10 MG tablet Take 2 tablets (20 mg total) by mouth 2 (two) times daily. 360 tablet 3  . calcitRIOL (ROCALTROL) 0.25 MCG capsule Take 0.25 mcg by mouth 3 (three) times a week.    . carvedilol (COREG) 12.5 MG tablet TAKE 1 TABLET BY MOUTH TWICE DAILY 60 tablet 5  . Cholecalciferol (VITAMIN D PO) Take 1 capsule by mouth daily.     . clopidogrel (PLAVIX) 75 MG tablet TAKE 1 TABLET BY MOUTH ONCE DAILY 90 tablet 2  . cyclobenzaprine (FLEXERIL) 10 MG tablet take 1 tablet by mouth three times a day if needed for muscle spasm 30 tablet 0  . DULoxetine (CYMBALTA) 60 MG capsule TAKE 1 CAPSULE(60 MG) BY MOUTH DAILY 90 capsule 0    . finasteride (PROSCAR) 5 MG tablet Take 5 mg by mouth daily.    . furosemide (LASIX) 20 MG tablet Take 20 mg by mouth daily.    Vanessa Kick Ethyl (VASCEPA) 1 g CAPS Take 2 capsules (2 g total) by mouth daily. 120 capsule 5  . InFLIXimab (REMICADE IV) Inject into the vein every 6 (six) weeks.    . insulin NPH Human (NOVOLIN N RELION) 100 UNIT/ML injection Inject 40 units in am and 20 units in the evening (Patient taking differently: Inject 20 Units into the skin daily as needed (High Blood Sugar). ) 40 mL 2  . insulin regular (NOVOLIN R,HUMULIN R) 100 units/mL injection Inject underskin 10-30 units regular insulin three times a day before meals. 40 mL 5  . ketoconazole (NIZORAL) 2 % cream Apply topically 2 (two) times daily. For rash 15 g 0  . MAGNESIUM PO Take 400 mg by mouth daily.     . nitroGLYCERIN (NITROSTAT) 0.4 MG SL tablet Place 1 tablet (0.4 mg total) under the tongue every 5 (five) minutes as needed. 25 tablet 1  . omeprazole (PRILOSEC) 20 MG capsule Take 1 capsule (20 mg total) by mouth 2 (two) times daily. (Patient taking differently: Take 20 mg by mouth 2 (two) times daily as needed (HEARTBURN). ) 20 capsule 0  . RELION INSULIN SYRINGE 31G  X 15/64" 1 ML MISC ONE INJECTION THREE TIMES DAILY 200 each 2  . sertraline (ZOLOFT) 100 MG tablet Take 2 tablets (200 mg total) by mouth daily. 180 tablet 3  . simvastatin (ZOCOR) 20 MG tablet TAKE 1 TABLET BY MOUTH ONCE DAILY AT 6PM 90 tablet 1  . tamsulosin (FLOMAX) 0.4 MG CAPS capsule take 2 capsule by mouth daily (Patient taking differently: Take 0.4 mg by mouth 2 (two) times daily. ) 60 capsule 11  . losartan (COZAAR) 100 MG tablet Take 1 tablet (100 mg total) by mouth daily. 30 tablet 3   No current facility-administered medications on file prior to visit.     Allergies  Allergen Reactions  . Contrast Media [Iodinated Diagnostic Agents] Rash and Other (See Comments)    Got very hot and red   . Glipizide Other (See Comments)     ANTIDIABETICS. Burning  . Metrizamide Other (See Comments)    Got very hot and red  . Penicillins Hives and Swelling    Has patient had a PCN reaction causing immediate rash, facial/tongue/throat swelling, SOB or lightheadedness with hypotension: yes Has patient had a PCN reaction causing severe rash involving mucus membranes or skin necrosis: no  Has patient had a PCN reaction that required hospitalization: yes Has patient had a PCN reaction occurring within the last 10 years: no If all of the above answers are "NO", then may proceed with Cephalosporin use.     Past Medical History:  Diagnosis Date  . Adenomatous colon polyp   . Allergy   . Angina at rest [facility])    chronic  . Anxiety   . Arthritis    RA  . Bell's palsy 2007  . Carotid artery occlusion   . Cataract    Dr. Dawna Part  . Coronary artery disease   . Depression   . Diabetes mellitus   . Diverticulosis   . Duodenitis   . DVT (deep venous thrombosis) (HCC)    in leg  . Dyspnea    DOE  . Gastropathy   . GERD (gastroesophageal reflux disease)   . Heart murmur   . Helicobacter pylori gastritis   . Hyperlipidemia   . Hypertension   . Mild aortic stenosis   . Neuropathy   . Obesity   . Peripheral vascular disease ([place])   . Post splenectomy syndrome   . Psoriasis   . Psoriatic arthritis ([place])   . Sleep apnea    uses cpap  . SOB (shortness of breath)   . Stroke ([place])   . Tobacco use disorder    recently quit  . Tubular adenoma 08/2013   Dr. Hilarie Fredrickson  . Weak urinary stream     Past Surgical History:  Procedure Laterality Date  . CARDIAC CATHETERIZATION  08/2010   LAD: 80% ISR, RCA: 80% ostial, OM 80-90%  . CAROTID ENDARTERECTOMY  08/2010   left/ Dr. Kellie Simmering  . CATARACT EXTRACTION W/PHACO Left 07/09/2017   Procedure: CATARACT EXTRACTION PHACO AND INTRAOCULAR LENS PLACEMENT (IOC);  Surgeon: Birder Robson, MD;  Location: [facility] ORS;  Service: Ophthalmology;  Laterality: Left;  [place] 00:23 AP% 14.2 CDE  3.26 Fluid pack lot # [id number]  . CATARACT EXTRACTION W/PHACO Right 09/10/2017   Procedure: CATARACT EXTRACTION PHACO AND INTRAOCULAR LENS PLACEMENT (IOC);  Surgeon: Birder Robson, MD;  Location: [facility] ORS;  Service: Ophthalmology;  Laterality: Right;  [place] 00:26.4 AP% 17.3 CDE 4.59 Fluid Pack Lot # [id number]  . COLONOSCOPY    . CORONARY ANGIOPLASTY  LAD: before CABG  . CORONARY ARTERY BYPASS GRAFT  09/06/2010   At [facility]: LIMA to LAD, left radial to RCA, sequential SVG to OM3 and 4  . heart stents  Jan 2011   leg stents 06/2009 and 03/2010  . PTA of illiac and SFA  multiple   Dr. Ronalee Belts, s/p revision 11/2012  . SPLENECTOMY    . VASCULAR SURGERY     LEG STENTS    Family History  Problem Relation Age of Onset  . Lung cancer Father 68  . Arthritis Father   . Breast cancer Sister 19  . Alcoholism Sister   . Dementia Mother   . Alzheimer's disease Mother   . Arthritis Mother   . Alcoholism Brother   . Epilepsy Sister   . Diabetes Paternal Grandfather   . Colon polyps Paternal Grandfather 52  . Alcoholism Sister   . Alcoholism Sister   . Alcoholism Brother     Social History   Socioeconomic History  . Marital status: Married    Spouse name: Not on file  . Number of children: 0  . Years of education: Not on file  . Highest education level: Not on file  Occupational History  . Occupation: Chief Operating Officer at [employer]: Disabled mostly due to neuropathy  Social Needs  . Financial resource strain: Not on file  . Food insecurity:    Worry: Not on file    Inability: Not on file  . Transportation needs:    Medical: Not on file    Non-medical: Not on file  Tobacco Use  . Smoking status: Former Smoker    Packs/day: 0.75    Years: 40.00    Pack years: 30.00    Types: Cigarettes    Last attempt to quit: 04/15/2017    Years since quitting: 0.8  . Smokeless tobacco: Never Used  . Tobacco comment: 1 cigarette a day  Substance and Sexual Activity  . Alcohol use:  No  . Drug use: No  . Sexual activity: Never  Lifestyle  . Physical activity:    Days per week: Not on file    Minutes per session: Not on file  . Stress: Not on file  Relationships  . Social connections:    Talks on phone: Not on file    Gets together: Not on file    Attends religious service: Not on file    Active member of club or organization: Not on file    Attends meetings of clubs or organizations: Not on file    Relationship status: Not on file  . Intimate partner violence:    Fear of current or ex partner: Not on file    Emotionally abused: Not on file    Physically abused: Not on file    Forced sexual activity: Not on file  Other Topics Concern  . Not on file  Social History Narrative   Has living will   Wife is health care POA---brother Ronalee Belts is alternate   Would accept resuscitation --but no prolonged ventilation   No tube feeds if cognitively unaware   Review of Systems Appetite is off due to constipation No weight change Is trying to keep up with fluids---drinking more water    Objective:   Physical Exam  GI: Soft. There is no tenderness.  Musculoskeletal:  Feet warm but no pulses  Skin:  No foot lesions  Psychiatric: He has a normal mood and affect. His behavior is normal.  Assessment & Plan:

## 2018-02-19 ENCOUNTER — Ambulatory Visit (INDEPENDENT_AMBULATORY_CARE_PROVIDER_SITE_OTHER): Payer: Medicare Other | Admitting: Psychology

## 2018-02-19 DIAGNOSIS — F4312 Post-traumatic stress disorder, chronic: Secondary | ICD-10-CM

## 2018-02-19 DIAGNOSIS — F4323 Adjustment disorder with mixed anxiety and depressed mood: Secondary | ICD-10-CM | POA: Diagnosis not present

## 2018-02-20 ENCOUNTER — Other Ambulatory Visit: Payer: Self-pay | Admitting: Cardiovascular Disease

## 2018-02-25 ENCOUNTER — Ambulatory Visit (INDEPENDENT_AMBULATORY_CARE_PROVIDER_SITE_OTHER): Payer: Medicare Other

## 2018-02-25 DIAGNOSIS — I779 Disorder of arteries and arterioles, unspecified: Secondary | ICD-10-CM

## 2018-02-25 DIAGNOSIS — I739 Peripheral vascular disease, unspecified: Principal | ICD-10-CM

## 2018-02-26 ENCOUNTER — Ambulatory Visit: Payer: Medicare Other | Admitting: Psychology

## 2018-02-28 ENCOUNTER — Encounter: Payer: Self-pay | Admitting: Internal Medicine

## 2018-03-03 ENCOUNTER — Other Ambulatory Visit: Payer: Self-pay | Admitting: *Deleted

## 2018-03-03 DIAGNOSIS — I779 Disorder of arteries and arterioles, unspecified: Secondary | ICD-10-CM

## 2018-03-03 DIAGNOSIS — I739 Peripheral vascular disease, unspecified: Principal | ICD-10-CM

## 2018-03-05 ENCOUNTER — Ambulatory Visit (INDEPENDENT_AMBULATORY_CARE_PROVIDER_SITE_OTHER): Payer: Medicare Other | Admitting: Psychology

## 2018-03-05 DIAGNOSIS — F4312 Post-traumatic stress disorder, chronic: Secondary | ICD-10-CM | POA: Diagnosis not present

## 2018-03-05 DIAGNOSIS — F4323 Adjustment disorder with mixed anxiety and depressed mood: Secondary | ICD-10-CM | POA: Diagnosis not present

## 2018-03-06 DIAGNOSIS — L4050 Arthropathic psoriasis, unspecified: Secondary | ICD-10-CM | POA: Diagnosis not present

## 2018-03-06 DIAGNOSIS — M2550 Pain in unspecified joint: Secondary | ICD-10-CM | POA: Diagnosis not present

## 2018-03-06 DIAGNOSIS — M150 Primary generalized (osteo)arthritis: Secondary | ICD-10-CM | POA: Diagnosis not present

## 2018-03-06 DIAGNOSIS — M0579 Rheumatoid arthritis with rheumatoid factor of multiple sites without organ or systems involvement: Secondary | ICD-10-CM | POA: Diagnosis not present

## 2018-03-06 DIAGNOSIS — Z6841 Body Mass Index (BMI) 40.0 and over, adult: Secondary | ICD-10-CM | POA: Diagnosis not present

## 2018-03-06 DIAGNOSIS — L409 Psoriasis, unspecified: Secondary | ICD-10-CM | POA: Diagnosis not present

## 2018-03-08 ENCOUNTER — Other Ambulatory Visit: Payer: Self-pay | Admitting: Family Medicine

## 2018-03-19 ENCOUNTER — Ambulatory Visit (INDEPENDENT_AMBULATORY_CARE_PROVIDER_SITE_OTHER): Payer: Medicare Other | Admitting: Psychology

## 2018-03-19 DIAGNOSIS — F4312 Post-traumatic stress disorder, chronic: Secondary | ICD-10-CM | POA: Diagnosis not present

## 2018-03-19 DIAGNOSIS — R809 Proteinuria, unspecified: Secondary | ICD-10-CM | POA: Diagnosis not present

## 2018-03-19 DIAGNOSIS — E1122 Type 2 diabetes mellitus with diabetic chronic kidney disease: Secondary | ICD-10-CM | POA: Diagnosis not present

## 2018-03-19 DIAGNOSIS — D631 Anemia in chronic kidney disease: Secondary | ICD-10-CM | POA: Diagnosis not present

## 2018-03-19 DIAGNOSIS — E875 Hyperkalemia: Secondary | ICD-10-CM | POA: Diagnosis not present

## 2018-03-19 DIAGNOSIS — F4323 Adjustment disorder with mixed anxiety and depressed mood: Secondary | ICD-10-CM | POA: Diagnosis not present

## 2018-03-19 DIAGNOSIS — E785 Hyperlipidemia, unspecified: Secondary | ICD-10-CM | POA: Diagnosis not present

## 2018-03-19 DIAGNOSIS — I10 Essential (primary) hypertension: Secondary | ICD-10-CM | POA: Diagnosis not present

## 2018-03-19 DIAGNOSIS — N183 Chronic kidney disease, stage 3 (moderate): Secondary | ICD-10-CM | POA: Diagnosis not present

## 2018-03-19 DIAGNOSIS — B182 Chronic viral hepatitis C: Secondary | ICD-10-CM | POA: Diagnosis not present

## 2018-03-20 ENCOUNTER — Other Ambulatory Visit: Payer: Self-pay | Admitting: Cardiovascular Disease

## 2018-03-20 DIAGNOSIS — M0579 Rheumatoid arthritis with rheumatoid factor of multiple sites without organ or systems involvement: Secondary | ICD-10-CM | POA: Diagnosis not present

## 2018-03-22 ENCOUNTER — Emergency Department: Payer: Medicare Other

## 2018-03-22 ENCOUNTER — Other Ambulatory Visit: Payer: Self-pay

## 2018-03-22 ENCOUNTER — Encounter: Payer: Self-pay | Admitting: Emergency Medicine

## 2018-03-22 ENCOUNTER — Emergency Department
Admission: EM | Admit: 2018-03-22 | Discharge: 2018-03-22 | Disposition: A | Discharge status: Home / Self Care | Payer: Medicare Other | Attending: Emergency Medicine | Admitting: Emergency Medicine

## 2018-03-22 DIAGNOSIS — Z7901 Long term (current) use of anticoagulants: Secondary | ICD-10-CM | POA: Insufficient documentation

## 2018-03-22 DIAGNOSIS — E1122 Type 2 diabetes mellitus with diabetic chronic kidney disease: Secondary | ICD-10-CM | POA: Insufficient documentation

## 2018-03-22 DIAGNOSIS — M545 Low back pain: Secondary | ICD-10-CM | POA: Diagnosis not present

## 2018-03-22 DIAGNOSIS — N183 Chronic kidney disease, stage 3 (moderate): Secondary | ICD-10-CM | POA: Insufficient documentation

## 2018-03-22 DIAGNOSIS — Z79899 Other long term (current) drug therapy: Secondary | ICD-10-CM | POA: Diagnosis not present

## 2018-03-22 DIAGNOSIS — S3992XA Unspecified injury of lower back, initial encounter: Secondary | ICD-10-CM | POA: Diagnosis not present

## 2018-03-22 DIAGNOSIS — Y999 Unspecified external cause status: Secondary | ICD-10-CM | POA: Diagnosis not present

## 2018-03-22 DIAGNOSIS — Y939 Activity, unspecified: Secondary | ICD-10-CM | POA: Diagnosis not present

## 2018-03-22 DIAGNOSIS — Z87891 Personal history of nicotine dependence: Secondary | ICD-10-CM | POA: Diagnosis not present

## 2018-03-22 DIAGNOSIS — Z794 Long term (current) use of insulin: Secondary | ICD-10-CM | POA: Insufficient documentation

## 2018-03-22 DIAGNOSIS — I2510 Atherosclerotic heart disease of native coronary artery without angina pectoris: Secondary | ICD-10-CM | POA: Diagnosis not present

## 2018-03-22 DIAGNOSIS — M79672 Pain in left foot: Secondary | ICD-10-CM | POA: Diagnosis not present

## 2018-03-22 DIAGNOSIS — W19XXXA Unspecified fall, initial encounter: Secondary | ICD-10-CM

## 2018-03-22 DIAGNOSIS — I129 Hypertensive chronic kidney disease with stage 1 through stage 4 chronic kidney disease, or unspecified chronic kidney disease: Secondary | ICD-10-CM | POA: Diagnosis not present

## 2018-03-22 DIAGNOSIS — W010XXA Fall on same level from slipping, tripping and stumbling without subsequent striking against object, initial encounter: Secondary | ICD-10-CM | POA: Diagnosis not present

## 2018-03-22 DIAGNOSIS — Y92008 Other place in unspecified non-institutional (private) residence as the place of occurrence of the external cause: Secondary | ICD-10-CM | POA: Insufficient documentation

## 2018-03-22 DIAGNOSIS — S069X9A Unspecified intracranial injury with loss of consciousness of unspecified duration, initial encounter: Secondary | ICD-10-CM | POA: Diagnosis not present

## 2018-03-22 DIAGNOSIS — J449 Chronic obstructive pulmonary disease, unspecified: Secondary | ICD-10-CM | POA: Insufficient documentation

## 2018-03-22 NOTE — ED Notes (Signed)
Denies vision changes. Reports hitting neck but not head on concrete. Denies LOC

## 2018-03-22 NOTE — ED Provider Notes (Signed)
[facility] Emergency Department Provider Note  ____________________________________________  Time seen: Approximately 11:49 AM  I have reviewed the triage vital signs and the nursing notes.   HISTORY  Chief Complaint Fall    HPI [name] [name] is a 62 y.o. male that presents to the emergency department for evaluation of low back pain after falling this morning.  Patient was taking the trash out when his left leg felt numb and gave out on him.  He states that he has neuropathy in both legs but usually has symptoms in his right leg.  He denies any feeling of numbness, tingling, weakness in his leg now.  He landed on his back.  He hit his head but did not lose consciousness.  He takes aspirin daily.  Pain is on both sides of his back and not the center.  He has been walking since fall.  No bowel or bladder dysfunction or saddle anesthesias.  No headache, dizziness, visual changes, neck pain, shortness of breath, chest pain, nausea, vomiting, abdominal pain.  Past Medical History:  Diagnosis Date  . Adenomatous colon polyp   . Allergy   . Angina at rest [facility])    chronic  . Anxiety   . Arthritis    RA  . Bell's palsy 2007  . Carotid artery occlusion   . Cataract    Dr. Dawna Part  . Coronary artery disease   . Depression   . Diabetes mellitus   . Diverticulosis   . Duodenitis   . DVT (deep venous thrombosis) (HCC)    in leg  . Dyspnea    DOE  . Gastropathy   . GERD (gastroesophageal reflux disease)   . Heart murmur   . Helicobacter pylori gastritis   . Hyperlipidemia   . Hypertension   . Mild aortic stenosis   . Neuropathy   . Obesity   . Peripheral vascular disease ([place])   . Post splenectomy syndrome   . Psoriasis   . Psoriatic arthritis ([place])   . Sleep apnea    uses cpap  . SOB (shortness of breath)   . Stroke ([place])   . Tobacco use disorder    recently quit  . Tubular adenoma 08/2013   Dr. Hilarie Fredrickson  . Weak urinary stream     Patient  Active Problem List   Diagnosis Date Noted  . Monilial rash 10/08/2017  . Bursitis of right elbow 10/08/2017  . Atherosclerosis of aorta ([place]) 09/04/2017  . Thoracic aortic aneurysm ([place]) 09/04/2017  . Right thyroid nodule 04/12/2017  . Thrombocytopenia ([place]) 03/16/2017  . CKD stage 3 due to type 2 diabetes mellitus ([place]) 03/15/2017  . Advance directive discussed with patient 03/15/2017  . BMI 40.0-44.9, adult ([place]) 09/12/2016  . Psoriatic arthritis ([place])   . Lung nodule < 6cm on CT 03/29/2016  . COPD with acute bronchitis ([place]) 03/29/2016  . Headache 09/14/2015  . Constipation 10/05/2014  . PVD (peripheral vascular disease) ([place]) 04/23/2014  . Memory loss 04/23/2014  . Preventative health care 11/23/2013  . Benign prostatic hypertrophy without urinary obstruction 03/25/2013  . Enlarged prostate without lower urinary tract symptoms (luts) 03/25/2013  . Obesity 02/12/2013  . Coronary atherosclerosis 06/06/2012  . Nonrheumatic aortic valve stenosis   . Low back pain 12/27/2011  . MDD (major depressive disorder), recurrent episode ([place]) 05/28/2011  . Major depressive disorder, single episode 05/28/2011  . Carotid artery disease ([place]) 04/17/2011  . Sleep apnea 03/05/2011  . Psoriasis 03/05/2011  . Neuropathy ([place]) 03/05/2011  .  Type 2 diabetes mellitus with diabetic peripheral angiopathy without gangrene ([place]) 03/05/2011  . Polyneuropathy 03/05/2011  . Coronary artery disease   . Other and unspecified hyperlipidemia   . Essential hypertension     Past Surgical History:  Procedure Laterality Date  . CARDIAC CATHETERIZATION  08/2010   LAD: 80% ISR, RCA: 80% ostial, OM 80-90%  . CAROTID ENDARTERECTOMY  08/2010   left/ Dr. Kellie Simmering  . CATARACT EXTRACTION W/PHACO Left 07/09/2017   Procedure: CATARACT EXTRACTION PHACO AND INTRAOCULAR LENS PLACEMENT (IOC);  Surgeon: Birder Robson, MD;  Location: [facility] ORS;  Service: Ophthalmology;  Laterality: Left;  [place] 00:23 AP% 14.2 CDE  3.26 Fluid pack lot # [id number]  . CATARACT EXTRACTION W/PHACO Right 09/10/2017   Procedure: CATARACT EXTRACTION PHACO AND INTRAOCULAR LENS PLACEMENT (IOC);  Surgeon: Birder Robson, MD;  Location: [facility] ORS;  Service: Ophthalmology;  Laterality: Right;  [place] 00:26.4 AP% 17.3 CDE 4.59 Fluid Pack Lot # [id number]  . COLONOSCOPY    . CORONARY ANGIOPLASTY     LAD: before CABG  . CORONARY ARTERY BYPASS GRAFT  09/06/2010   At [facility]: LIMA to LAD, left radial to RCA, sequential SVG to OM3 and 4  . heart stents  Jan 2011   leg stents 06/2009 and 03/2010  . PTA of illiac and SFA  multiple   Dr. Ronalee Belts, s/p revision 11/2012  . SPLENECTOMY    . VASCULAR SURGERY     LEG STENTS    Prior to Admission medications   Medication Sig Start Date End Date Taking? Authorizing Provider  ACCU-CHEK FASTCLIX LANCETS MISC Use to check sugar 6 times daily. 11/11/17   Philemon Kingdom, MD  albuterol (PROAIR HFA) 108 (90 Base) MCG/ACT inhaler Inhale 2 puffs into the lungs every 6 (six) hours as needed for shortness of breath. 10/08/17   [name], MD  aspirin 81 MG tablet Take 81 mg by mouth daily.    [provider]  BAYER CONTOUR TEST test strip TEST THREE TIMES DAILY AS DIRECTED 10/29/17   Philemon Kingdom, MD  buPROPion (WELLBUTRIN XL) 300 MG 24 hr tablet Take 1 tablet (300 mg total) by mouth daily. 12/25/17   [name], MD  busPIRone (BUSPAR) 10 MG tablet Take 2 tablets (20 mg total) by mouth 2 (two) times daily. 12/25/17   [name], MD  calcitRIOL (ROCALTROL) 0.25 MCG capsule Take 0.25 mcg by mouth 3 (three) times a week.    [provider]  carvedilol (COREG) 12.5 MG tablet TAKE 1 TABLET BY MOUTH TWICE DAILY 12/20/17   Wellington Hampshire, MD  Cholecalciferol (VITAMIN D PO) Take 1 capsule by mouth daily.     [provider]  clopidogrel (PLAVIX) 75 MG tablet TAKE 1 TABLET BY MOUTH ONCE DAILY 11/11/17   Wellington Hampshire, MD  cyclobenzaprine (FLEXERIL) 10 MG tablet take 1  tablet by mouth three times a day if needed for muscle spasm 03/06/16   Burnard Hawthorne, FNP  DULoxetine (CYMBALTA) 60 MG capsule TAKE 1 CAPSULE(60 MG) BY MOUTH DAILY 02/10/18   Tower, Wynelle Fanny, MD  DULoxetine (CYMBALTA) 60 MG capsule TAKE 1 CAPSULE(60 MG) BY MOUTH DAILY 03/10/18   [name], MD  finasteride (PROSCAR) 5 MG tablet Take 5 mg by mouth daily.    [provider]  furosemide (LASIX) 20 MG tablet Take 20 mg by mouth daily.    [provider]  Icosapent Ethyl (VASCEPA) 1 g CAPS Take 2 capsules (2 g total) by mouth  daily. 12/20/17   Wellington Hampshire, MD  InFLIXimab (REMICADE IV) Inject into the vein every 6 (six) weeks.    [provider]  insulin NPH Human (NOVOLIN N RELION) 100 UNIT/ML injection Inject 40 units in am and 20 units in the evening Patient taking differently: Inject 20 Units into the skin daily as needed (High Blood Sugar).  10/22/16   Philemon Kingdom, MD  insulin regular (NOVOLIN R,HUMULIN R) 100 units/mL injection Inject underskin 10-30 units regular insulin three times a day before meals. 01/20/18   Philemon Kingdom, MD  ketoconazole (NIZORAL) 2 % cream Apply topically 2 (two) times daily. For rash 10/08/17   [name], MD  losartan (COZAAR) 100 MG tablet TAKE 1 TABLET(100 MG) BY MOUTH DAILY 03/21/18   Wellington Hampshire, MD  MAGNESIUM PO Take 400 mg by mouth daily.     [provider]  nitroGLYCERIN (NITROSTAT) 0.4 MG SL tablet Place 1 tablet (0.4 mg total) under the tongue every 5 (five) minutes as needed. 06/24/12   Wellington Hampshire, MD  omeprazole (PRILOSEC) 20 MG capsule Take 1 capsule (20 mg total) by mouth 2 (two) times daily. Patient taking differently: Take 20 mg by mouth 2 (two) times daily as needed (HEARTBURN).  09/24/13   Pyrtle, Lajuan Lines, MD  RELION INSULIN SYRINGE 31G X 15/64" 1 ML MISC ONE INJECTION THREE TIMES DAILY 01/20/18   Philemon Kingdom, MD  sertraline (ZOLOFT) 100 MG tablet Take 2 tablets (200 mg total) by  mouth daily. 12/25/17 12/25/18  Viviana Simpler I, MD  simvastatin (ZOCOR) 20 MG tablet TAKE 1 TABLET BY MOUTH ONCE DAILY AT [facility] 10/23/17   [name], MD  tamsulosin (FLOMAX) 0.4 MG CAPS capsule take 2 capsule by mouth daily Patient taking differently: Take 0.4 mg by mouth 2 (two) times daily.  06/18/16   Festus Aloe, MD    Allergies Contrast media [iodinated diagnostic agents]; Glipizide; Metrizamide; and Penicillins  Family History  Problem Relation Age of Onset  . Lung cancer Father 49  . Arthritis Father   . Breast cancer Sister 64  . Alcoholism Sister   . Dementia Mother   . Alzheimer's disease Mother   . Arthritis Mother   . Alcoholism Brother   . Epilepsy Sister   . Diabetes Paternal Grandfather   . Colon polyps Paternal Grandfather 73  . Alcoholism Sister   . Alcoholism Sister   . Alcoholism Brother     Social History Social History   Tobacco Use  . Smoking status: Former Smoker    Packs/day: 0.75    Years: 40.00    Pack years: 30.00    Types: Cigarettes    Last attempt to quit: 04/15/2017    Years since quitting: 0.9  . Smokeless tobacco: Never Used  . Tobacco comment: 1 cigarette a day  Substance Use Topics  . Alcohol use: No  . Drug use: No     Review of Systems  Cardiovascular: No chest pain. Respiratory: No SOB. Gastrointestinal: No abdominal pain.  No nausea, no vomiting.  Musculoskeletal: Positive for back pain.  Skin: Negative for rash, abrasions, lacerations, ecchymosis. Neurological: Negative for headaches  ____________________________________________   PHYSICAL EXAM:  VITAL SIGNS: ED Triage Vitals  Enc Vitals Group     BP 03/22/18 1047 133/68     Pulse Rate 03/22/18 1047 77     Resp 03/22/18 1047 16     Temp 03/22/18 1047 98.4 F (36.9 C)     Temp Source  03/22/18 1047 Oral     SpO2 03/22/18 1047 96 %     Weight 03/22/18 1046 255 lb (115.7 kg)     Height 03/22/18 1046 5\' 6"  (1.676 m)     Head Circumference --      Peak  Flow --      Pain Score 03/22/18 1046 8     Pain Loc --      Pain Edu? --      Excl. in [place]? --      Constitutional: Alert and oriented. Well appearing and in no acute distress. Eyes: Conjunctivae are normal. PERRL. EOMI. Head: Atraumatic. ENT:      Ears:      Nose: No congestion/rhinnorhea.      Mouth/Throat: Mucous membranes are moist.  Neck: No stridor. No cervical spine tenderness to palpation. Cardiovascular: Normal rate, regular rhythm.  Good peripheral circulation. Respiratory: Normal respiratory effort without tachypnea or retractions. Lungs CTAB. Good air entry to the bases with no decreased or absent breath sounds. Gastrointestinal: Bowel sounds 4 quadrants. Soft and nontender to palpation. No guarding or rigidity. No palpable masses. No distention.  Musculoskeletal: Full range of motion to all extremities. No gross deformities appreciated.  No tenderness to palpation over lumbar spine.  Minimal tenderness to palpation over lumbar paraspinal muscles.  Strength equal in lower extremities bilaterally.  Normal gait.  No foot drop. Neurologic:  Normal speech and language. No gross focal neurologic deficits are appreciated.  Skin:  Skin is warm, dry and intact. No rash noted. Psychiatric: Mood and affect are normal. Speech and behavior are normal. Patient exhibits appropriate insight and judgement.   ____________________________________________   LABS (all labs ordered are listed, but only abnormal results are displayed)  Labs Reviewed - No data to display ____________________________________________  EKG   ____________________________________________  RADIOLOGY   Ct Head Wo Contrast  Result Date: 03/22/2018 CLINICAL DATA:  c/o fall while taking out the trash today, denies head trauma during fall. -LOC. Pt reports hx of neuropathy in right foot and states foot felt numb this am which caused him to fall. EXAM: CT HEAD WITHOUT CONTRAST TECHNIQUE: Contiguous axial images  were obtained from the base of the skull through the vertex without intravenous contrast. COMPARISON:  07/02/2016 FINDINGS: Brain: No evidence of acute infarction, hemorrhage, hydrocephalus, extra-axial collection or mass lesion/mass effect. There is ventricular and sulcal enlargement reflecting mild diffuse atrophy. There is more prominent volume loss from the cerebellum. These findings are stable. Vascular: No hyperdense vessel or unexpected calcification. Skull: Normal. Negative for fracture or focal lesion. Sinuses/Orbits: Globes and orbits are unremarkable. Visualized sinuses and mastoid air cells are clear. Other: None. IMPRESSION: 1. No acute intracranial abnormalities. Stable appearance from the prior study. Electronically Signed   By: Lajean Manes M.D.   On: 03/22/2018 12:22    ____________________________________________    PROCEDURES  Procedure(s) performed:    Procedures    Medications - No data to display   ____________________________________________   INITIAL IMPRESSION / ASSESSMENT AND PLAN / ED COURSE  Pertinent labs & imaging results that were available during my care of the patient were reviewed by me and considered in my medical decision making (see chart for details).  Review of the [place] CSRS was performed in accordance of the [place] prior to dispensing any controlled drugs.     Patient presented to the emergency department for evaluation after fall.  Vital signs and exam are reassuring.  Patient hit his head on the concrete and did  not lose consciousness.  Head CT negative for acute abnormalities.  He appears well and is up walking around the room.  He has an appointment with primary care on Tuesday.   Patient is to follow up with primary care as directed. Patient is given ED precautions to return to the ED for any worsening or new symptoms.     ____________________________________________  FINAL CLINICAL IMPRESSION(S) / ED DIAGNOSES  Final diagnoses:   Fall, initial encounter      NEW MEDICATIONS STARTED DURING THIS VISIT:  ED Discharge Orders    None          This chart was dictated using voice recognition software/Dragon. Despite best efforts to proofread, errors can occur which can change the meaning. Any change was purely unintentional.    Laban Emperor, PA-C 03/22/18 1520    Nena Polio, MD 03/22/18 [phone]

## 2018-03-22 NOTE — ED Triage Notes (Signed)
Fall this morning.  While putting the trash up, fell to the ground.  Patient has history of neuropathy to legs.  C/O low back pain.

## 2018-03-22 NOTE — ED Notes (Signed)
First Nurse Note:  Pt to ed via ems with c/o fall while taking out the trash today.  Pt reports hx of neuropathy in right foot and states foot felt numb this am which caused him to fall.  Denies loss of consciousness, denies head trauma during fall.   Pt alert and oriented x 4.

## 2018-03-25 ENCOUNTER — Ambulatory Visit (INDEPENDENT_AMBULATORY_CARE_PROVIDER_SITE_OTHER): Payer: Medicare Other | Admitting: Internal Medicine

## 2018-03-25 ENCOUNTER — Encounter: Payer: Self-pay | Admitting: Internal Medicine

## 2018-03-25 VITALS — BP 122/84 | HR 68 | Temp 97.4°F | Ht 68.5 in | Wt 258.0 lb

## 2018-03-25 DIAGNOSIS — F331 Major depressive disorder, recurrent, moderate: Secondary | ICD-10-CM | POA: Diagnosis not present

## 2018-03-25 DIAGNOSIS — Z0000 Encounter for general adult medical examination without abnormal findings: Secondary | ICD-10-CM | POA: Diagnosis not present

## 2018-03-25 DIAGNOSIS — K2100 Gastro-esophageal reflux disease with esophagitis, without bleeding: Secondary | ICD-10-CM

## 2018-03-25 DIAGNOSIS — Z23 Encounter for immunization: Secondary | ICD-10-CM

## 2018-03-25 DIAGNOSIS — G629 Polyneuropathy, unspecified: Secondary | ICD-10-CM

## 2018-03-25 DIAGNOSIS — E1122 Type 2 diabetes mellitus with diabetic chronic kidney disease: Secondary | ICD-10-CM

## 2018-03-25 DIAGNOSIS — N183 Chronic kidney disease, stage 3 unspecified: Secondary | ICD-10-CM

## 2018-03-25 DIAGNOSIS — I6529 Occlusion and stenosis of unspecified carotid artery: Secondary | ICD-10-CM | POA: Diagnosis not present

## 2018-03-25 DIAGNOSIS — I10 Essential (primary) hypertension: Secondary | ICD-10-CM | POA: Diagnosis not present

## 2018-03-25 DIAGNOSIS — E1151 Type 2 diabetes mellitus with diabetic peripheral angiopathy without gangrene: Secondary | ICD-10-CM | POA: Diagnosis not present

## 2018-03-25 DIAGNOSIS — Z794 Long term (current) use of insulin: Secondary | ICD-10-CM | POA: Diagnosis not present

## 2018-03-25 DIAGNOSIS — K219 Gastro-esophageal reflux disease without esophagitis: Secondary | ICD-10-CM | POA: Insufficient documentation

## 2018-03-25 LAB — RENAL FUNCTION PANEL
Albumin: 4.1 g/dL (ref 3.5–5.2)
BUN: 46 mg/dL — ABNORMAL HIGH (ref 6–23)
CO2: 26 mEq/L (ref 19–32)
Calcium: 8.9 mg/dL (ref 8.4–10.5)
Chloride: 103 mEq/L (ref 96–112)
Creatinine, Ser: 2.87 mg/dL — ABNORMAL HIGH (ref 0.40–1.50)
GFR: 23.65 mL/min — ABNORMAL LOW (ref >60.00)
Glucose, Bld: 157 mg/dL — ABNORMAL HIGH (ref 70–99)
Phosphorus: 4.5 mg/dL (ref 2.3–4.6)
Potassium: 4.6 mEq/L (ref 3.5–5.1)
Sodium: 139 mEq/L (ref 135–145)

## 2018-03-25 LAB — CBC
HCT: 36.3 % — ABNORMAL LOW (ref 39.0–52.0)
Hemoglobin: 12.3 g/dL — ABNORMAL LOW (ref 13.0–17.0)
MCHC: 33.8 g/dL (ref 30.0–36.0)
MCV: 95.4 fl (ref 78.0–100.0)
Platelets: 144 10*3/uL — ABNORMAL LOW (ref 150.0–400.0)
RBC: 3.8 Mil/uL — ABNORMAL LOW (ref 4.22–5.81)
RDW: 14.3 % (ref 11.5–15.5)
WBC: 7.5 10*3/uL (ref 4.0–10.5)

## 2018-03-25 LAB — HM DIABETES FOOT EXAM

## 2018-03-25 MED ORDER — ESOMEPRAZOLE MAGNESIUM 20 MG PO CPDR: 20.0000 mg | DELAYED_RELEASE_CAPSULE | Freq: Two times a day (BID) | ORAL | 3 refills | Status: DC

## 2018-03-25 NOTE — Assessment & Plan Note (Signed)
On complex regimen--I am managing No change for now

## 2018-03-25 NOTE — Patient Instructions (Addendum)
Restart the nexium at twice a day on an empty stomach. If your throat symptoms go away, you can try decreasing to once a day. You should try using a walker to keep your balance and help you legs (due to weakness)

## 2018-03-25 NOTE — Assessment & Plan Note (Signed)
I have personally reviewed the Medicare Annual Wellness questionnaire and have noted 1. The patient's medical and social history 2. Their use of alcohol, tobacco or illicit drugs 3. Their current medications and supplements 4. The patient's functional ability including ADL's, fall risks, home safety risks and hearing or visual             impairment. 5. Diet and physical activities 6. Evidence for depression or mood disorders  The patients weight, height, BMI and visual acuity have been recorded in the chart I have made referrals, counseling and provided education to the patient based review of the above and I have provided the pt with a written personalized care plan for preventive services.  I have provided you with a copy of your personalized plan for preventive services. Please take the time to review along with your updated medication list.  Yearly flu vaccine--today Due for repeat colon now--due to large polyps Recent PSA Discussed fitness

## 2018-03-25 NOTE — Assessment & Plan Note (Signed)
Restart PPI due to worsening dysphagia

## 2018-03-25 NOTE — Progress Notes (Signed)
Hearing Screening   Method: Audiometry   125Hz  250Hz  500Hz  1000Hz  2000Hz  3000Hz  4000Hz  6000Hz  8000Hz   Right ear:   20 20 20  20     Left ear:   40 40 40  0    Vision Screening Comments: 05/29/17

## 2018-03-25 NOTE — Progress Notes (Signed)
Subjective:    Patient ID: [name] [name], male    DOB: 05-25-1956, 62 y.o.   MRN: [id number]  HPI Here for Medicare wellness visit and follow up of chronic health conditions Reviewed form and advanced directives Reviewed other doctors No alcohol Did quit tobacco last year Tries to walk regularly 1 fall 3 days ago--no injury Independent with instrumental ADLs Chronic depression Some trouble with memory--"mind goes blank" at times  [place] 3 days ago Left leg gave out on him when he turned around in gravel (putting out garbage) Leg was numb---for an hour or so Seen in ER---evaluation negative Got CT scan of head--- no stroke (has left side residual of Bell's) Leg feels back to his baseline--- has known neuropathy Is going back to Dr Tat Has cane but not using regularly yet Getting a MedAlert button  Feels the depression is still somewhat better since on the duloxetine Wonders about increasing the dose---but not ready for this yet Still seeing Dr Rexene Edison  Sugars fine Known CKD--has follow up with Dr [name] coming up Neuropathy as well Eye exam due later in fall  No chest pain No SOB Occasional palpitations---nothing worrisome Some dizziness ---- standing and even in bed at times  Known low platelets No abnormal bleeding  Current Outpatient Medications on File Prior to Visit  Medication Sig Dispense Refill  . ACCU-CHEK FASTCLIX LANCETS MISC Use to check sugar 6 times daily. 600 each 5  . albuterol (PROAIR HFA) 108 (90 Base) MCG/ACT inhaler Inhale 2 puffs into the lungs every 6 (six) hours as needed for shortness of breath. 1 Inhaler 1  . aspirin 81 MG tablet Take 81 mg by mouth daily.    [name] BAYER CONTOUR TEST test strip TEST THREE TIMES DAILY AS DIRECTED 300 each 1  . buPROPion (WELLBUTRIN XL) 300 MG 24 hr tablet Take 1 tablet (300 mg total) by mouth daily. 90 tablet 3  . busPIRone (BUSPAR) 10 MG tablet Take 2 tablets (20 mg total) by mouth 2 (two) times daily. 360  tablet 3  . calcitRIOL (ROCALTROL) 0.25 MCG capsule Take 0.25 mcg by mouth 3 (three) times a week.    . carvedilol (COREG) 12.5 MG tablet TAKE 1 TABLET BY MOUTH TWICE DAILY 60 tablet 5  . Cholecalciferol (VITAMIN D PO) Take 1 capsule by mouth daily.     . clopidogrel (PLAVIX) 75 MG tablet TAKE 1 TABLET BY MOUTH ONCE DAILY 90 tablet 2  . cyclobenzaprine (FLEXERIL) 10 MG tablet take 1 tablet by mouth three times a day if needed for muscle spasm 30 tablet 0  . DULoxetine (CYMBALTA) 60 MG capsule TAKE 1 CAPSULE(60 MG) BY MOUTH DAILY 90 capsule 1  . finasteride (PROSCAR) 5 MG tablet Take 5 mg by mouth daily.    . furosemide (LASIX) 20 MG tablet Take 20 mg by mouth daily.    Vanessa Kick Ethyl (VASCEPA) 1 g CAPS Take 2 capsules (2 g total) by mouth daily. 120 capsule 5  . InFLIXimab (REMICADE IV) Inject into the vein every 6 (six) weeks.    . insulin NPH Human (NOVOLIN N RELION) 100 UNIT/ML injection Inject 40 units in am and 20 units in the evening (Patient taking differently: Inject 20 Units into the skin daily as needed (High Blood Sugar). ) 40 mL 2  . insulin regular (NOVOLIN R,HUMULIN R) 100 units/mL injection Inject underskin 10-30 units regular insulin three times a day before meals. 40 mL 5  . ketoconazole (NIZORAL) 2 % cream Apply  topically 2 (two) times daily. For rash 15 g 0  . losartan (COZAAR) 100 MG tablet TAKE 1 TABLET(100 MG) BY MOUTH DAILY 30 tablet 2  . MAGNESIUM PO Take 400 mg by mouth daily.     . nitroGLYCERIN (NITROSTAT) 0.4 MG SL tablet Place 1 tablet (0.4 mg total) under the tongue every 5 (five) minutes as needed. 25 tablet 1  . omeprazole (PRILOSEC) 20 MG capsule Take 1 capsule (20 mg total) by mouth 2 (two) times daily. (Patient taking differently: Take 20 mg by mouth 2 (two) times daily as needed (HEARTBURN). ) 20 capsule 0  . RELION INSULIN SYRINGE 31G X 15/64" 1 ML MISC ONE INJECTION THREE TIMES DAILY 200 each 2  . sertraline (ZOLOFT) 100 MG tablet Take 2 tablets (200 mg  total) by mouth daily. 180 tablet 3  . simvastatin (ZOCOR) 20 MG tablet TAKE 1 TABLET BY MOUTH ONCE DAILY AT 6PM 90 tablet 1  . tamsulosin (FLOMAX) 0.4 MG CAPS capsule take 2 capsule by mouth daily (Patient taking differently: Take 0.4 mg by mouth 2 (two) times daily. ) 60 capsule 11   No current facility-administered medications on file prior to visit.     Allergies  Allergen Reactions  . Contrast Media [Iodinated Diagnostic Agents] Rash and Other (See Comments)    Got very hot and red   . Glipizide Other (See Comments)    ANTIDIABETICS. Burning  . Metrizamide Other (See Comments)    Got very hot and red  . Penicillins Hives and Swelling    Has patient had a PCN reaction causing immediate rash, facial/tongue/throat swelling, SOB or lightheadedness with hypotension: yes Has patient had a PCN reaction causing severe rash involving mucus membranes or skin necrosis: no  Has patient had a PCN reaction that required hospitalization: yes Has patient had a PCN reaction occurring within the last 10 years: no If all of the above answers are "NO", then may proceed with Cephalosporin use.     Past Medical History:  Diagnosis Date  . Adenomatous colon polyp   . Allergy   . Angina at [facility])    chronic  . Anxiety   . Arthritis    RA  . Bell's palsy 2007  . Carotid artery occlusion   . Cataract    Dr. Dawna Part  . Coronary artery disease   . Depression   . Diabetes mellitus   . Diverticulosis   . Duodenitis   . DVT (deep venous thrombosis) (H[facility])    in leg  . Dyspnea    DOE  . Gastropathy   . GERD (gastroesophageal reflux disease)   . Heart murmur   . Helicobacter pylori gastritis   . Hyperlipidemia   . Hypertension   . Mild aortic stenosis   . Neuropathy   . Obesity   . Peripheral vascular disease ([place])   . Post splenectomy syndrome   . Psoriasis   . Psoriatic arthritis ([place])   . Sleep apnea    uses cpap  . SOB (shortness of breath)   . Stroke ([place])   . Tobacco use  disorder    recently quit  . Tubular adenoma 08/2013   Dr. Hilarie Fredrickson  . Weak urinary stream     Past Surgical History:  Procedure Laterality Date  . CARDIAC CATHETERIZATION  08/2010   LAD: 80% ISR, RCA: 80% ostial, OM 80-90%  . CAROTID ENDARTERECTOMY  08/2010   left/ Dr. Kellie Simmering  . CATARACT EXTRACTION W/PHACO Left 07/09/2017   Procedure:  CATARACT EXTRACTION PHACO AND INTRAOCULAR LENS PLACEMENT (IOC);  Surgeon: Birder Robson, MD;  Location: [facility] ORS;  Service: Ophthalmology;  Laterality: Left;  [place] 00:23 AP% 14.2 CDE 3.26 Fluid pack lot # [id number]  . CATARACT EXTRACTION W/PHACO Right 09/10/2017   Procedure: CATARACT EXTRACTION PHACO AND INTRAOCULAR LENS PLACEMENT (IOC);  Surgeon: Birder Robson, MD;  Location: [facility] ORS;  Service: Ophthalmology;  Laterality: Right;  [place] 00:26.4 AP% 17.3 CDE 4.59 Fluid Pack Lot # [id number]  . COLONOSCOPY    . CORONARY ANGIOPLASTY     LAD: before CABG  . CORONARY ARTERY BYPASS GRAFT  09/06/2010   At [facility]: LIMA to LAD, left radial to RCA, sequential SVG to OM3 and 4  . heart stents  Jan 2011   leg stents 06/2009 and 03/2010  . PTA of illiac and SFA  multiple   Dr. Ronalee Belts, s/p revision 11/2012  . SPLENECTOMY    . VASCULAR SURGERY     LEG STENTS    Family History  Problem Relation Age of Onset  . Lung cancer Father 53  . Arthritis Father   . Breast cancer Sister 46  . Alcoholism Sister   . Dementia Mother   . Alzheimer's disease Mother   . Arthritis Mother   . Alcoholism Brother   . Epilepsy Sister   . Diabetes Paternal Grandfather   . Colon polyps Paternal Grandfather 70  . Alcoholism Sister   . Alcoholism Sister   . Alcoholism Brother     Social History   Socioeconomic History  . Marital status: Married    Spouse name: Not on file  . Number of children: 0  . Years of education: Not on file  . Highest education level: Not on file  Occupational History  . Occupation: Chief Operating Officer at [employer]: Disabled mostly due to  neuropathy  Social Needs  . Financial resource strain: Not on file  . Food insecurity:    Worry: Not on file    Inability: Not on file  . Transportation needs:    Medical: Not on file    Non-medical: Not on file  Tobacco Use  . Smoking status: Former Smoker    Packs/day: 0.75    Years: 40.00    Pack years: 30.00    Types: Cigarettes    Last attempt to quit: 04/15/2017    Years since quitting: 0.9  . Smokeless tobacco: Never Used  . Tobacco comment: 1 cigarette a day  Substance and Sexual Activity  . Alcohol use: No  . Drug use: No  . Sexual activity: Never  Lifestyle  . Physical activity:    Days per week: Not on file    Minutes per session: Not on file  . Stress: Not on file  Relationships  . Social connections:    Talks on phone: Not on file    Gets together: Not on file    Attends religious service: Not on file    Active member of club or organization: Not on file    Attends meetings of clubs or organizations: Not on file    Relationship status: Not on file  . Intimate partner violence:    Fear of current or ex partner: Not on file    Emotionally abused: Not on file    Physically abused: Not on file    Forced sexual activity: Not on file  Other Topics Concern  . Not on file  Social History Narrative   Has living will   Wife  is health care POA---brother Ronalee Belts is alternate   Would accept resuscitation --but no prolonged ventilation   No tube feeds if cognitively unaware   Review of Systems Appetite is good Weight stable again (gained after stopping smoking) Sleeps well Wears seat belt Full dentures remicade cleared psoriasis Bowels are fine. No blood Voids fine. Good flow and empties fine---on tamsulosin Some trouble with throat--can feel it and some dysphagia. Takes rolaids prn. Had run out of the prilosec Will be seeing Dr Dew--needs stents in his legs    Objective:   Physical Exam  Constitutional: He is oriented to person, place, and time. He appears  well-developed. No distress.  HENT:  Mouth/Throat: Oropharynx is clear and moist. No oropharyngeal exudate.  Neck: No thyromegaly present.  Cardiovascular: Normal rate and regular rhythm. Exam reveals no gallop.  No pulses in feet  Gr 0-1/7 aortic systolic murmur  Respiratory: Effort normal and breath sounds normal. No respiratory distress. He has no wheezes. He has no rales.  GI: Soft. There is no tenderness.  Musculoskeletal: He exhibits no edema or tenderness.  Lymphadenopathy:    He has no cervical adenopathy.  Neurological: He is alert and oriented to person, place, and time.  President--- "Daisy Floro, Obama, Bush" [phone] D-[id number] Recall 3/3  Sig decreased sensation in feet Marked leg weakness  Skin:  No foot lesions Early rosacea on cheeks  Psychiatric: He has a normal mood and affect. His behavior is normal.           Assessment & Plan:

## 2018-03-25 NOTE — Assessment & Plan Note (Signed)
On ARB Will recheck

## 2018-03-25 NOTE — Assessment & Plan Note (Signed)
Lab Results  Component Value Date   HGBA1C 5.9 (A) 12/10/2017   Good control PVD, neuropathy

## 2018-03-25 NOTE — Assessment & Plan Note (Signed)
Ongoing neuro evaluation Also has weakness Discussed using walker

## 2018-03-25 NOTE — Addendum Note (Signed)
Addended by: Pilar Grammes on: 03/25/2018 09:28 AM   Modules accepted: Orders

## 2018-03-25 NOTE — Assessment & Plan Note (Signed)
BP Readings from Last 3 Encounters:  03/25/18 122/84  03/22/18 (!) 149/75  02/18/18 132/82   Good control

## 2018-03-26 ENCOUNTER — Telehealth: Payer: Self-pay

## 2018-03-26 ENCOUNTER — Ambulatory Visit: Payer: Medicare Other | Admitting: Psychology

## 2018-03-26 ENCOUNTER — Telehealth: Payer: Self-pay | Admitting: Internal Medicine

## 2018-03-26 DIAGNOSIS — E875 Hyperkalemia: Secondary | ICD-10-CM | POA: Diagnosis not present

## 2018-03-26 DIAGNOSIS — I129 Hypertensive chronic kidney disease with stage 1 through stage 4 chronic kidney disease, or unspecified chronic kidney disease: Secondary | ICD-10-CM | POA: Diagnosis not present

## 2018-03-26 DIAGNOSIS — N184 Chronic kidney disease, stage 4 (severe): Secondary | ICD-10-CM | POA: Diagnosis not present

## 2018-03-26 DIAGNOSIS — R809 Proteinuria, unspecified: Secondary | ICD-10-CM | POA: Diagnosis not present

## 2018-03-26 DIAGNOSIS — N2581 Secondary hyperparathyroidism of renal origin: Secondary | ICD-10-CM | POA: Diagnosis not present

## 2018-03-26 DIAGNOSIS — E1122 Type 2 diabetes mellitus with diabetic chronic kidney disease: Secondary | ICD-10-CM | POA: Diagnosis not present

## 2018-03-26 NOTE — Telephone Encounter (Signed)
Contour next test strips ACCU-CHEK FASTCLIX LANCETS MISC  Patient stated that he test his sugar at least 6 times daily and needs this sent into the pharmacy for the correct amount. Patient is completely out of these.     [facility] [place], [place]

## 2018-03-26 NOTE — Telephone Encounter (Signed)
Form completed on CoverMyMeds. Your information has been submitted to [place] Medicare Part D. [employer] Medicare Part D will review the request and will issue a decision, typically within 1-3 days from your submission

## 2018-03-27 NOTE — Telephone Encounter (Signed)
Nexium approved to 03-26-19

## 2018-03-28 MED ORDER — ACCU-CHEK FASTCLIX LANCETS MISC: 5 refills | Status: DC

## 2018-03-28 NOTE — Telephone Encounter (Signed)
Sent and his prescription already states he checks 6 times daily

## 2018-03-28 NOTE — Addendum Note (Signed)
Addended by: Drucilla Schmidt on: 03/28/2018 09:11 AM   Modules accepted: Orders

## 2018-03-29 ENCOUNTER — Telehealth: Payer: Self-pay

## 2018-03-29 NOTE — Telephone Encounter (Signed)
Call pt regarding lung screening scan  Left message on 03-29-18 at 1:44.

## 2018-03-31 ENCOUNTER — Telehealth: Payer: Self-pay | Admitting: *Deleted

## 2018-03-31 DIAGNOSIS — Z87891 Personal history of nicotine dependence: Secondary | ICD-10-CM

## 2018-03-31 DIAGNOSIS — Z122 Encounter for screening for malignant neoplasm of respiratory organs: Secondary | ICD-10-CM

## 2018-03-31 NOTE — Telephone Encounter (Signed)
Patient has been notified that annual lung cancer screening low dose CT scan is due currently or will be in near future. Confirmed that patient is within the age range of 55-77, and asymptomatic, (no signs or symptoms of lung cancer). Patient denies illness that would prevent curative treatment for lung cancer if found. Verified smoking history, (former, quit 2018, 40.25 pack year). The shared decision making visit was done 03/27/16. Patient is agreeable for CT scan being scheduled.

## 2018-04-01 ENCOUNTER — Ambulatory Visit (INDEPENDENT_AMBULATORY_CARE_PROVIDER_SITE_OTHER): Payer: Medicare Other | Admitting: Psychology

## 2018-04-01 ENCOUNTER — Encounter: Payer: Self-pay | Admitting: Psychology

## 2018-04-01 ENCOUNTER — Telehealth: Payer: Self-pay | Admitting: Internal Medicine

## 2018-04-01 DIAGNOSIS — R413 Other amnesia: Secondary | ICD-10-CM | POA: Diagnosis not present

## 2018-04-01 MED ORDER — ACCU-CHEK FASTCLIX LANCETS MISC: 5 refills | Status: DC

## 2018-04-01 MED ORDER — GLUCOSE BLOOD VI STRP: ORAL_STRIP | 3 refills | Status: DC

## 2018-04-01 NOTE — Telephone Encounter (Signed)
ACCU-CHEK FASTCLIX LANCETS MISC   BAYER CONTOUR TEST test strip  Patient stated he needs a prescription sent into the pharmacy.       [facility] [place], [place]
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
## 2018-04-01 NOTE — Telephone Encounter (Signed)
Sent!

## 2018-04-02 ENCOUNTER — Ambulatory Visit: Payer: Medicare Other | Admitting: Psychology

## 2018-04-02 ENCOUNTER — Telehealth: Payer: Self-pay | Admitting: *Deleted

## 2018-04-02 DIAGNOSIS — R413 Other amnesia: Secondary | ICD-10-CM

## 2018-04-02 NOTE — Progress Notes (Signed)
   Neuropsychology Note  [name] completed 60 minutes of neuropsychological testing with technician, Milana Kidney, BS, under the supervision of Dr. Macarthur Critchley, Licensed Psychologist. The patient did not appear overtly distressed by the testing session, per behavioral observation or via self-report to the technician. Rest breaks were offered.   Clinical Decision Making: In considering the patient's current level of functioning, level of presumed impairment, nature of symptoms, emotional and behavioral responses during the interview, level of literacy, and observed level of motivation/effort, a battery of tests was selected and communicated to the psychometrician.  Communication between the psychologist and technician was ongoing throughout the testing session and changes were made as deemed necessary based on patient performance on testing, technician observations and additional pertinent factors such as those listed above.  [name] [name] will return within approximately 2 weeks for an interactive feedback session with Dr. Si Raider at which time his test performances, clinical impressions and treatment recommendations will be reviewed in detail. The patient understands he can contact our office should he require our assistance before this time.  35 minutes spent performing neuropsychological evaluation services/clinical decision making (psychologist). [CPT 54492] 60 minutes spent face-to-face with patient administering standardized tests, 30 minutes spent scoring (technician). [CPT Y8200648, 01007]  Full report to follow.

## 2018-04-02 NOTE — Telephone Encounter (Signed)
Opened in error

## 2018-04-03 ENCOUNTER — Other Ambulatory Visit: Payer: Self-pay | Admitting: Internal Medicine

## 2018-04-04 ENCOUNTER — Encounter: Payer: Self-pay | Admitting: Internal Medicine

## 2018-04-06 ENCOUNTER — Other Ambulatory Visit: Payer: Self-pay | Admitting: Internal Medicine

## 2018-04-08 ENCOUNTER — Telehealth: Payer: Self-pay | Admitting: Internal Medicine

## 2018-04-08 ENCOUNTER — Other Ambulatory Visit: Payer: Self-pay | Admitting: Internal Medicine

## 2018-04-08 DIAGNOSIS — E1142 Type 2 diabetes mellitus with diabetic polyneuropathy: Secondary | ICD-10-CM | POA: Diagnosis not present

## 2018-04-08 DIAGNOSIS — B351 Tinea unguium: Secondary | ICD-10-CM | POA: Diagnosis not present

## 2018-04-08 NOTE — Telephone Encounter (Signed)
Dorian Pod from [facility] # [phone]  Calling to let us know that although it does appear we sent the contour test strips in they did not receive the rx.   I asked if I could do a verbal she said yes so I read the rx to her

## 2018-04-11 ENCOUNTER — Ambulatory Visit
Admission: RE | Admit: 2018-04-11 | Discharge: 2018-04-11 | Disposition: A | Discharge status: Home / Self Care | Payer: Medicare Other | Source: Ambulatory Visit | Attending: Oncology | Admitting: Oncology

## 2018-04-11 DIAGNOSIS — I700 Atherosclerosis of aorta: Secondary | ICD-10-CM | POA: Insufficient documentation

## 2018-04-11 DIAGNOSIS — J439 Emphysema, unspecified: Secondary | ICD-10-CM | POA: Diagnosis not present

## 2018-04-11 DIAGNOSIS — Z87891 Personal history of nicotine dependence: Secondary | ICD-10-CM | POA: Insufficient documentation

## 2018-04-11 DIAGNOSIS — Z122 Encounter for screening for malignant neoplasm of respiratory organs: Secondary | ICD-10-CM | POA: Diagnosis not present

## 2018-04-14 ENCOUNTER — Encounter: Payer: Self-pay | Admitting: *Deleted

## 2018-04-16 ENCOUNTER — Ambulatory Visit (INDEPENDENT_AMBULATORY_CARE_PROVIDER_SITE_OTHER): Payer: Medicare Other | Admitting: Psychology

## 2018-04-16 DIAGNOSIS — F4312 Post-traumatic stress disorder, chronic: Secondary | ICD-10-CM | POA: Diagnosis not present

## 2018-04-16 DIAGNOSIS — F4323 Adjustment disorder with mixed anxiety and depressed mood: Secondary | ICD-10-CM | POA: Diagnosis not present

## 2018-04-17 ENCOUNTER — Other Ambulatory Visit: Payer: Self-pay | Admitting: Internal Medicine

## 2018-04-17 DIAGNOSIS — E119 Type 2 diabetes mellitus without complications: Secondary | ICD-10-CM | POA: Diagnosis not present

## 2018-04-17 LAB — HM DIABETES EYE EXAM

## 2018-04-18 ENCOUNTER — Ambulatory Visit (INDEPENDENT_AMBULATORY_CARE_PROVIDER_SITE_OTHER): Payer: Medicare Other | Admitting: Vascular Surgery

## 2018-04-18 ENCOUNTER — Encounter (INDEPENDENT_AMBULATORY_CARE_PROVIDER_SITE_OTHER): Payer: Self-pay | Admitting: Vascular Surgery

## 2018-04-18 ENCOUNTER — Encounter (INDEPENDENT_AMBULATORY_CARE_PROVIDER_SITE_OTHER): Payer: Medicare Other

## 2018-04-18 ENCOUNTER — Ambulatory Visit (INDEPENDENT_AMBULATORY_CARE_PROVIDER_SITE_OTHER): Payer: Medicare Other

## 2018-04-18 VITALS — BP 118/71 | HR 77 | Resp 16 | Ht 68.0 in | Wt 251.6 lb

## 2018-04-18 DIAGNOSIS — E1151 Type 2 diabetes mellitus with diabetic peripheral angiopathy without gangrene: Secondary | ICD-10-CM

## 2018-04-18 DIAGNOSIS — I739 Peripheral vascular disease, unspecified: Secondary | ICD-10-CM | POA: Diagnosis not present

## 2018-04-18 DIAGNOSIS — Z794 Long term (current) use of insulin: Secondary | ICD-10-CM | POA: Diagnosis not present

## 2018-04-18 DIAGNOSIS — I10 Essential (primary) hypertension: Secondary | ICD-10-CM

## 2018-04-18 DIAGNOSIS — I6529 Occlusion and stenosis of unspecified carotid artery: Secondary | ICD-10-CM

## 2018-04-18 NOTE — Progress Notes (Signed)
MRN : [id number]  [name] [name] is a 62 y.o. ([birth year]) male who presents with chief complaint of  Chief Complaint  Patient presents with  . Follow-up    6 month ABI and LLE arterial  .  History of Present Illness: Patient returns today in follow up of PAD.  He is debilitated by neuropathy symptoms but does not have any lifestyle limiting claudication, ischemic rest pain, or ulceration. His ABIs today are stable at 1.04 on the right and 0.75 on the left.  He has normal digital pressures and biphasic waveforms bilaterally.  Current Outpatient Medications  Medication Sig Dispense Refill  . ACCU-CHEK FASTCLIX LANCETS MISC Use to check sugar 1 time daily. 100 each 5  . albuterol (PROAIR HFA) 108 (90 Base) MCG/ACT inhaler Inhale 2 puffs into the lungs every 6 (six) hours as needed for shortness of breath. 1 Inhaler 1  . aspirin 81 MG tablet Take 81 mg by mouth daily.    [name] buPROPion (WELLBUTRIN XL) 300 MG 24 hr tablet Take 1 tablet (300 mg total) by mouth daily. 90 tablet 3  . busPIRone (BUSPAR) 10 MG tablet Take 2 tablets (20 mg total) by mouth 2 (two) times daily. 360 tablet 3  . calcitRIOL (ROCALTROL) 0.25 MCG capsule Take 0.25 mcg by mouth 3 (three) times a week.    . carvedilol (COREG) 12.5 MG tablet TAKE 1 TABLET BY MOUTH TWICE DAILY 60 tablet 5  . Cholecalciferol (VITAMIN D PO) Take 1 capsule by mouth daily.     . Cinnamon 500 MG capsule Take by mouth.    . clopidogrel (PLAVIX) 75 MG tablet TAKE 1 TABLET BY MOUTH ONCE DAILY 90 tablet 2  . cyclobenzaprine (FLEXERIL) 10 MG tablet take 1 tablet by mouth three times a day if needed for muscle spasm 30 tablet 0  . DULoxetine (CYMBALTA) 60 MG capsule TAKE 1 CAPSULE(60 MG) BY MOUTH DAILY 90 capsule 1  . esomeprazole (NEXIUM) 20 MG capsule Take 1 capsule (20 mg total) by mouth 2 (two) times daily before a meal. 180 capsule 3  . finasteride (PROSCAR) 5 MG tablet Take 5 mg by mouth daily.    . furosemide (LASIX) 20 MG tablet Take 20 mg by  mouth daily.    . Glucosamine-Chondroit-Vit C-Mn (GLUCOSAMINE CHONDROITIN COMPLX) CAPS Take by mouth.    [name] glucose blood test strip Use as instructed to test sugars one time daily DX E11.51 100 each 3  . Icosapent Ethyl (VASCEPA) 1 g CAPS Take 2 capsules (2 g total) by mouth daily. 120 capsule 5  . InFLIXimab (REMICADE IV) Inject into the vein every 6 (six) weeks.    . insulin NPH Human (NOVOLIN N RELION) 100 UNIT/ML injection Inject 40 units in am and 20 units in the evening (Patient taking differently: Inject 20 Units into the skin daily as needed (High Blood Sugar). ) 40 mL 2  . insulin regular (NOVOLIN R,HUMULIN R) 100 units/mL injection Inject underskin 10-30 units regular insulin three times a day before meals. 40 mL 5  . ketoconazole (NIZORAL) 2 % cream Apply topically 2 (two) times daily. For rash 15 g 0  . losartan (COZAAR) 100 MG tablet TAKE 1 TABLET(100 MG) BY MOUTH DAILY 30 tablet 2  . MAGNESIUM PO Take 400 mg by mouth daily.     . metoprolol succinate (TOPROL-XL) 50 MG 24 hr tablet Take by mouth.    . Multiple Vitamin (MULTIVITAMIN) capsule Take by mouth.    . nitroGLYCERIN (NITROSTAT)  0.4 MG SL tablet Place 1 tablet (0.4 mg total) under the tongue every 5 (five) minutes as needed. 25 tablet 1  . omeprazole (PRILOSEC) 20 MG capsule Take 1 capsule (20 mg total) by mouth 2 (two) times daily. (Patient taking differently: Take 20 mg by mouth 2 (two) times daily as needed (HEARTBURN). ) 20 capsule 0  . RELION INSULIN SYRINGE 31G X 15/64" 1 ML MISC ONE INJECTION THREE TIMES DAILY 200 each 2  . sertraline (ZOLOFT) 100 MG tablet Take 2 tablets (200 mg total) by mouth daily. 180 tablet 3  . simvastatin (ZOCOR) 20 MG tablet TAKE 1 TABLET BY MOUTH ONCE DAILY AT 6PM 90 tablet 3  . tamsulosin (FLOMAX) 0.4 MG CAPS capsule take 2 capsule by mouth daily (Patient taking differently: Take 0.4 mg by mouth 2 (two) times daily. ) 60 capsule 11   No current facility-administered medications for this visit.      Past Medical History:  Diagnosis Date  . Adenomatous colon polyp   . Allergy   . Angina at rest [facility])    chronic  . Anxiety   . Arthritis    RA  . Bell's palsy 2007  . Carotid artery occlusion   . Cataract    Dr. Dawna Part  . Coronary artery disease   . Depression   . Diabetes mellitus   . Diverticulosis   . Duodenitis   . DVT (deep venous thrombosis) ([facility])    in leg  . Dyspnea    DOE  . Gastropathy   . GERD (gastroesophageal reflux disease)   . Heart murmur   . Helicobacter pylori gastritis   . Hyperlipidemia   . Hypertension   . Mild aortic stenosis   . Neuropathy   . Obesity   . Peripheral vascular disease ([place])   . Post splenectomy syndrome   . Psoriasis   . Psoriatic arthritis ([place])   . Sleep apnea    uses cpap  . SOB (shortness of breath)   . Stroke ([place])   . Tobacco use disorder    recently quit  . Tubular adenoma 08/2013   Dr. Hilarie Fredrickson  . Weak urinary stream     Past Surgical History:  Procedure Laterality Date  . CARDIAC CATHETERIZATION  08/2010   LAD: 80% ISR, RCA: 80% ostial, OM 80-90%  . CAROTID ENDARTERECTOMY  08/2010   left/ Dr. Kellie Simmering  . CATARACT EXTRACTION W/PHACO Left 07/09/2017   Procedure: CATARACT EXTRACTION PHACO AND INTRAOCULAR LENS PLACEMENT (IOC);  Surgeon: Birder Robson, MD;  Location: [facility] ORS;  Service: Ophthalmology;  Laterality: Left;  [place] 00:23 AP% 14.2 CDE 3.26 Fluid pack lot # [id number]  . CATARACT EXTRACTION W/PHACO Right 09/10/2017   Procedure: CATARACT EXTRACTION PHACO AND INTRAOCULAR LENS PLACEMENT (IOC);  Surgeon: Birder Robson, MD;  Location: [facility] ORS;  Service: Ophthalmology;  Laterality: Right;  [place] 00:26.4 AP% 17.3 CDE 4.59 Fluid Pack Lot # [id number]  . COLONOSCOPY    . CORONARY ANGIOPLASTY     LAD: before CABG  . CORONARY ARTERY BYPASS GRAFT  09/06/2010   At [facility]: LIMA to LAD, left radial to RCA, sequential SVG to OM3 and 4  . heart stents  Jan 2011   leg stents 06/2009 and 03/2010  . PTA of illiac and  SFA  multiple   Dr. Ronalee Belts, s/p revision 11/2012  . SPLENECTOMY    . VASCULAR SURGERY     LEG STENTS    Social History  Substance Use Topics  . Smoking status: Light  Tobacco Smoker    Packs/day: 0.75    Years: 40.00    Types: Cigarettes  . Smokeless tobacco: Never Used     Comment: 1 cigarette a day  . Alcohol use No    Family History      Family History  Problem Relation Age of Onset  . Lung cancer Father 71  . Arthritis Father   . Breast cancer Sister 17  . Alcoholism Sister   . Dementia Mother   . Alzheimer's disease Mother   . Arthritis Mother   . Alcoholism Brother   . Epilepsy Sister   . Diabetes Paternal Grandfather   . Colon polyps Paternal Grandfather 68  . Alcoholism Sister   . Alcoholism Sister   . Alcoholism Brother           Allergies  Allergen Reactions  . Contrast Media [Iodinated Diagnostic Agents] Rash and Other (See Comments)    Got very hot and red   . Glipizide Other (See Comments)    ANTIDIABETICS. Burning  . Metrizamide Other (See Comments)    Got very hot and red  . Penicillins Hives and Swelling     REVIEW OF SYSTEMS(Negative unless checked)  Constitutional: [] Weight loss[] Fever[] Chills Cardiac:[] Chest pain[] Chest pressure[] Palpitations [] Shortness of breath when laying flat [] Shortness of breath at rest [] Shortness of breath with exertion. Vascular: [] Pain in legs with walking[] Pain in legsat rest[] Pain in legs when laying flat [x] Claudication [] Pain in feet when walking [x] Pain in feet at rest [] Pain in feet when laying flat [] History of DVT [] Phlebitis [x] Swelling in legs [] Varicose veins [] Non-healing ulcers Pulmonary: [] Uses home oxygen [] Productive cough[] Hemoptysis [] Wheeze [] COPD [] Asthma Neurologic: [] Dizziness [] Blackouts [] Seizures [] History of stroke [] History of TIA[] Aphasia [] Temporary blindness[] Dysphagia  [] Weaknessor numbness in arms [] Weakness or numbnessin legs Musculoskeletal: [x] Arthritis [] Joint swelling [] Joint pain [] Low back pain Hematologic:[] Easy bruising[] Easy bleeding [] Hypercoagulable state [] Anemic  Gastrointestinal:[] Blood in stool[] Vomiting blood[] Gastroesophageal reflux/heartburn[] Abdominal pain Genitourinary: [] Chronic kidney disease [] Difficulturination [] Frequenturination [] Burning with urination[] Hematuria Skin: [] Rashes [] Ulcers [] Wounds Psychological: [] History of anxiety[] History of major depression.     Physical Examination  BP 118/71 (BP Location: Right Arm)   Pulse 77   Resp 16   Ht 5\' 8"  (1.727 m)   Wt 251 lb 9.6 oz (114.1 kg)   BMI 38.26 kg/m  Gen:  WD/WN, NAD Head: [place]/AT, No temporalis wasting. Ear/Nose/Throat: Hearing grossly intact, nares w/o erythema or drainage Eyes: Conjunctiva clear. Sclera non-icteric Neck: Supple.  Trachea midline Pulmonary:  Good air movement, no use of accessory muscles.  Cardiac: RRR, no JVD Vascular:  Vessel Right Left  Radial Palpable Palpable                          PT  1+ palpable  1+ palpable  DP Palpable  1+ palpable   Gastrointestinal: soft, non-tender/non-distended. No guarding/reflex.  Musculoskeletal: M/S 5/5 throughout.  No deformity or atrophy.  Trace lower extremity edema. Neurologic: Sensation diminished in the extremities.  Symmetrical.  Speech is fluent.  Psychiatric: Judgment intact, Mood & affect appropriate for pt's clinical situation. Dermatologic: No rashes or ulcers noted.  No cellulitis or open wounds.       Labs Recent Results (from the past 2160 hour(s))  HM DIABETES FOOT EXAM     Status: None   Collection Time: 03/25/18 12:00 AM  Result Value Ref Range   HM Diabetic Foot Exam done   CBC     Status: Abnormal   Collection Time: 03/25/18  9:27 AM  Result Value Ref Range  WBC 7.5 4.0 - 10.5 K/uL   RBC 3.80 (L) 4.22 - 5.81  Mil/uL   Platelets 144.0 (L) 150.0 - 400.0 K/uL   Hemoglobin 12.3 (L) 13.0 - 17.0 g/dL   HCT 36.3 (L) 39.0 - 52.0 %   MCV 95.4 78.0 - 100.0 fl   MCHC 33.8 30.0 - 36.0 g/dL   RDW 14.3 11.5 - 15.5 %  Renal function panel     Status: Abnormal   Collection Time: 03/25/18  9:27 AM  Result Value Ref Range   Sodium 139 135 - 145 mEq/L   Potassium 4.6 3.5 - 5.1 mEq/L   Chloride 103 96 - 112 mEq/L   CO2 26 19 - 32 mEq/L   Calcium 8.9 8.4 - 10.5 mg/dL   Albumin 4.1 3.5 - 5.2 g/dL   BUN 46 (H) 6 - 23 mg/dL   Creatinine, Ser 2.87 (H) 0.40 - 1.50 mg/dL   Glucose, Bld 157 (H) 70 - 99 mg/dL   Phosphorus 4.5 2.3 - 4.6 mg/dL   GFR 23.65 (L) >60.00 mL/min    Radiology Ct Head Wo Contrast  Result Date: 03/22/2018 CLINICAL DATA:  c/o fall while taking out the trash today, denies head trauma during fall. -LOC. Pt reports hx of neuropathy in right foot and states foot felt numb this am which caused him to fall. EXAM: CT HEAD WITHOUT CONTRAST TECHNIQUE: Contiguous axial images were obtained from the base of the skull through the vertex without intravenous contrast. COMPARISON:  07/02/2016 FINDINGS: Brain: No evidence of acute infarction, hemorrhage, hydrocephalus, extra-axial collection or mass lesion/mass effect. There is ventricular and sulcal enlargement reflecting mild diffuse atrophy. There is more prominent volume loss from the cerebellum. These findings are stable. Vascular: No hyperdense vessel or unexpected calcification. Skull: Normal. Negative for fracture or focal lesion. Sinuses/Orbits: Globes and orbits are unremarkable. Visualized sinuses and mastoid air cells are clear. Other: None. IMPRESSION: 1. No acute intracranial abnormalities. Stable appearance from the prior study. Electronically Signed   By: Lajean Manes M.D.   On: 03/22/2018 12:22   Ct Chest Lung Cancer Screening Low Dose Wo Contrast  Result Date: 04/11/2018 CLINICAL DATA:  Asymptomatic ex-smoker, quitting 1 year ago. 40.3 pack-year  history. EXAM: CT CHEST WITHOUT CONTRAST LOW-DOSE FOR LUNG CANCER SCREENING TECHNIQUE: Multidetector CT imaging of the chest was performed following the standard protocol without IV contrast. COMPARISON:  04/09/2013 FINDINGS: Cardiovascular: Advanced aortic and branch vessel atherosclerosis. Tortuous thoracic aorta. Normal heart size, without pericardial effusion. Lipomatous hypertrophy of the interatrial septum. Median sternotomy for CABG. Mediastinum/Nodes: No mediastinal or definite hilar adenopathy, given limitations of unenhanced CT. Lungs/Pleura: No pleural fluid. Mild centrilobular emphysema. Calcified and noncalcified pulmonary nodules are not significantly changed. A left lower lobe nodular density of volume derived equivalent diameter 10.6 mm is similar to on the prior, including on image 223/3 and is favored to represent an area of mucoid impaction. Upper Abdomen: Old granulomatous disease in the liver and spleen. Normal imaged portions of the stomach, pancreas, gallbladder, adrenal glands. Musculoskeletal: No acute osseous abnormality. Mild thoracic spondylosis. IMPRESSION: 1. Lung-RADS 2, benign appearance or behavior. Continue annual screening with low-dose chest CT without contrast in 12 months. 2. Aortic atherosclerosis (ICD10-I70.0) and emphysema (ICD10-J43.9). Electronically Signed   By: Abigail Miyamoto M.D.   On: 04/11/2018 08:59    Assessment/Plan Essential hypertension blood pressure control important in reducing the progression of atherosclerotic disease. On appropriate oral medications.   Type 2 diabetes mellitus with diabetic peripheral angiopathy without gangrene ([place])  blood glucose control important in reducing the progression of atherosclerotic disease. Also, involved in wound healing. On appropriate medications.  PVD (peripheral vascular disease) ([place]) His ABIs today are stable at 1.04 on the right and 0.75 on the left.  He has normal digital pressures and biphasic waveforms  bilaterally.  No limb threatening symptoms.  At this point, he is about 4 years after his last intervention.  I think we can go to an annual follow-up.  Continue current medical regimen.    Leotis Pain, MD  04/18/2018 3:34 PM    This note was created with Dragon medical transcription system.  Any errors from dictation are purely unintentional

## 2018-04-18 NOTE — Assessment & Plan Note (Signed)
His ABIs today are stable at 1.04 on the right and 0.75 on the left.  He has normal digital pressures and biphasic waveforms bilaterally.  No limb threatening symptoms.  At this point, he is about 4 years after his last intervention.  I think we can go to an annual follow-up.  Continue current medical regimen.
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
## 2018-04-22 ENCOUNTER — Ambulatory Visit (INDEPENDENT_AMBULATORY_CARE_PROVIDER_SITE_OTHER): Payer: Medicare Other | Admitting: Psychology

## 2018-04-22 ENCOUNTER — Encounter: Payer: Self-pay | Admitting: Psychology

## 2018-04-22 DIAGNOSIS — R413 Other amnesia: Secondary | ICD-10-CM | POA: Diagnosis not present

## 2018-04-22 NOTE — Patient Instructions (Addendum)
I do not see any evidence of Alzheimer's disease or neurodegenerative dementia.   I suspect depression/grief is responsible for perceived worsening cognition from baseline.  Below is some more information on this. I recommend continuing mental health treatment.   Additionally, regular activity that provides mental stimulation, social interaction and safe cardiovascular exercise are encouraged to promote brain health.   The effect of depression and anxiety on your cognitive functioning: . One of the typical symptoms of depression is difficulty concentrating and making decisions, and various types of anxiety also interfere with attention and concentration . Problems with attention and concentration can disrupt the process of learning and making new memories, which can make it seem like there is a problem with your memory. In your daily life, you may experience this disruption as forgetting names and appointments, misplacing items, and needing to make lists for shopping and errands. It may be harder for you to stay focused on tasks and feel as "sharp" as you did in the past.  . Also, when we are depressed or anxious, we often pay more attention to our difficulties (rather than our strengths) in our daily life, and this can make it seem to [place] like we are doing worse cognitively than we really are. . The cognitive aspects of depression and anxiety are sometimes observed as an identifiable pattern of poor performance on a neuropsychological evaluation, but it is also possible that all scores on an evaluation are within normal limits. . Regardless of the test scores, distress related to depression and anxiety can interfere with the ability to make use of your cognitive resources and function optimally across settings such as work or school, maintaining the home and responsibilities, and personal relationships. . Fortunately, there are treatments for depression and anxiety, and when mood improves, cognitive  functioning in daily life often improves. . Treatment options include psychotherapy, medications (e.g., antidepressants), and behavioral changes, such as increasing your involvement in enjoyable activities, increasing the amount of exercise you are getting, and maintaining a regular routine.

## 2018-04-23 ENCOUNTER — Ambulatory Visit (INDEPENDENT_AMBULATORY_CARE_PROVIDER_SITE_OTHER): Payer: Medicare Other | Admitting: Psychology

## 2018-04-23 DIAGNOSIS — F4323 Adjustment disorder with mixed anxiety and depressed mood: Secondary | ICD-10-CM

## 2018-04-23 DIAGNOSIS — F4312 Post-traumatic stress disorder, chronic: Secondary | ICD-10-CM

## 2018-04-24 ENCOUNTER — Encounter: Payer: Self-pay | Admitting: Intensive Care

## 2018-04-24 ENCOUNTER — Encounter: Payer: Self-pay | Admitting: Internal Medicine

## 2018-04-24 ENCOUNTER — Other Ambulatory Visit: Payer: Self-pay

## 2018-04-24 ENCOUNTER — Emergency Department
Admission: EM | Admit: 2018-04-24 | Discharge: 2018-04-24 | Disposition: A | Discharge status: Home / Self Care | Payer: No Typology Code available for payment source | Attending: Emergency Medicine | Admitting: Emergency Medicine

## 2018-04-24 ENCOUNTER — Emergency Department: Payer: No Typology Code available for payment source

## 2018-04-24 DIAGNOSIS — E1122 Type 2 diabetes mellitus with diabetic chronic kidney disease: Secondary | ICD-10-CM | POA: Diagnosis not present

## 2018-04-24 DIAGNOSIS — S161XXA Strain of muscle, fascia and tendon at neck level, initial encounter: Secondary | ICD-10-CM

## 2018-04-24 DIAGNOSIS — I129 Hypertensive chronic kidney disease with stage 1 through stage 4 chronic kidney disease, or unspecified chronic kidney disease: Secondary | ICD-10-CM | POA: Diagnosis not present

## 2018-04-24 DIAGNOSIS — J449 Chronic obstructive pulmonary disease, unspecified: Secondary | ICD-10-CM | POA: Insufficient documentation

## 2018-04-24 DIAGNOSIS — Y929 Unspecified place or not applicable: Secondary | ICD-10-CM | POA: Diagnosis not present

## 2018-04-24 DIAGNOSIS — S0003XA Contusion of scalp, initial encounter: Secondary | ICD-10-CM | POA: Insufficient documentation

## 2018-04-24 DIAGNOSIS — S199XXA Unspecified injury of neck, initial encounter: Secondary | ICD-10-CM | POA: Diagnosis not present

## 2018-04-24 DIAGNOSIS — N183 Chronic kidney disease, stage 3 (moderate): Secondary | ICD-10-CM | POA: Diagnosis not present

## 2018-04-24 DIAGNOSIS — I2510 Atherosclerotic heart disease of native coronary artery without angina pectoris: Secondary | ICD-10-CM | POA: Insufficient documentation

## 2018-04-24 DIAGNOSIS — Z79899 Other long term (current) drug therapy: Secondary | ICD-10-CM | POA: Insufficient documentation

## 2018-04-24 DIAGNOSIS — S1091XA Abrasion of unspecified part of neck, initial encounter: Secondary | ICD-10-CM

## 2018-04-24 DIAGNOSIS — Z23 Encounter for immunization: Secondary | ICD-10-CM | POA: Diagnosis not present

## 2018-04-24 DIAGNOSIS — Y999 Unspecified external cause status: Secondary | ICD-10-CM | POA: Insufficient documentation

## 2018-04-24 DIAGNOSIS — Y939 Activity, unspecified: Secondary | ICD-10-CM | POA: Diagnosis not present

## 2018-04-24 DIAGNOSIS — S0990XA Unspecified injury of head, initial encounter: Secondary | ICD-10-CM | POA: Diagnosis not present

## 2018-04-24 HISTORY — DX: Disorder of kidney and ureter, unspecified: N28.9

## 2018-04-24 HISTORY — DX: Unspecified kidney failure: N19

## 2018-04-24 MED ORDER — TETANUS-DIPHTH-ACELL PERTUSSIS 5-2.5-18.5 LF-MCG/0.5 IM SUSP
0.5000 mL | Freq: Once | INTRAMUSCULAR | Status: AC
  Administered 2018-04-24: 0.5 mL via INTRAMUSCULAR
  Filled 2018-04-24: qty 0.5

## 2018-04-24 MED ORDER — CYCLOBENZAPRINE HCL 5 MG PO TABS: 5.0000 mg | ORAL_TABLET | Freq: Three times a day (TID) | ORAL | 0 refills | Status: DC | PRN
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
## 2018-04-24 NOTE — ED Notes (Signed)
Patient transported to CT 
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
## 2018-04-30 ENCOUNTER — Ambulatory Visit: Payer: Medicare Other | Admitting: Psychology

## 2018-05-07 ENCOUNTER — Ambulatory Visit: Payer: Medicare Other | Admitting: Psychology

## 2018-05-08 ENCOUNTER — Other Ambulatory Visit: Payer: Self-pay | Admitting: Emergency Medicine
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
## 2018-05-13 ENCOUNTER — Ambulatory Visit: Payer: Self-pay | Admitting: Neurology

## 2018-05-13 ENCOUNTER — Encounter: Payer: Self-pay | Admitting: Psychology

## 2018-05-13 DIAGNOSIS — M0579 Rheumatoid arthritis with rheumatoid factor of multiple sites without organ or systems involvement: Secondary | ICD-10-CM | POA: Diagnosis not present

## 2018-05-14 ENCOUNTER — Ambulatory Visit (INDEPENDENT_AMBULATORY_CARE_PROVIDER_SITE_OTHER): Payer: Medicare Other | Admitting: Psychology

## 2018-05-14 DIAGNOSIS — F4323 Adjustment disorder with mixed anxiety and depressed mood: Secondary | ICD-10-CM | POA: Diagnosis not present

## 2018-05-14 DIAGNOSIS — F4312 Post-traumatic stress disorder, chronic: Secondary | ICD-10-CM

## 2018-05-19 ENCOUNTER — Ambulatory Visit: Payer: Self-pay | Admitting: Neurology

## 2018-05-21 ENCOUNTER — Ambulatory Visit: Payer: Medicare Other | Admitting: Psychology

## 2018-05-28 ENCOUNTER — Ambulatory Visit: Payer: Medicare Other | Admitting: Psychology

## 2018-06-02 ENCOUNTER — Other Ambulatory Visit: Payer: Self-pay | Admitting: Cardiovascular Disease

## 2018-06-04 ENCOUNTER — Ambulatory Visit (INDEPENDENT_AMBULATORY_CARE_PROVIDER_SITE_OTHER): Payer: Medicare Other | Admitting: Psychology

## 2018-06-04 DIAGNOSIS — F4312 Post-traumatic stress disorder, chronic: Secondary | ICD-10-CM

## 2018-06-04 DIAGNOSIS — F4323 Adjustment disorder with mixed anxiety and depressed mood: Secondary | ICD-10-CM | POA: Diagnosis not present

## 2018-06-05 ENCOUNTER — Other Ambulatory Visit: Payer: Self-pay | Admitting: Neurology

## 2018-06-11 ENCOUNTER — Ambulatory Visit (INDEPENDENT_AMBULATORY_CARE_PROVIDER_SITE_OTHER): Payer: Medicare Other | Admitting: Psychology

## 2018-06-11 DIAGNOSIS — F4323 Adjustment disorder with mixed anxiety and depressed mood: Secondary | ICD-10-CM

## 2018-06-11 DIAGNOSIS — F4312 Post-traumatic stress disorder, chronic: Secondary | ICD-10-CM

## 2018-06-13 ENCOUNTER — Ambulatory Visit (INDEPENDENT_AMBULATORY_CARE_PROVIDER_SITE_OTHER): Payer: Medicare Other | Admitting: Internal Medicine

## 2018-06-13 ENCOUNTER — Encounter: Payer: Self-pay | Admitting: Internal Medicine

## 2018-06-13 VITALS — BP 126/82 | HR 72 | Ht 68.0 in | Wt 242.0 lb

## 2018-06-13 DIAGNOSIS — I6529 Occlusion and stenosis of unspecified carotid artery: Secondary | ICD-10-CM

## 2018-06-13 DIAGNOSIS — Z794 Long term (current) use of insulin: Secondary | ICD-10-CM

## 2018-06-13 DIAGNOSIS — E1151 Type 2 diabetes mellitus with diabetic peripheral angiopathy without gangrene: Secondary | ICD-10-CM

## 2018-06-13 DIAGNOSIS — E041 Nontoxic single thyroid nodule: Secondary | ICD-10-CM | POA: Diagnosis not present

## 2018-06-13 DIAGNOSIS — E782 Mixed hyperlipidemia: Secondary | ICD-10-CM

## 2018-06-13 LAB — POCT GLYCOSYLATED HEMOGLOBIN (HGB A1C): Hemoglobin A1C: 5.8 % — AB (ref 4.0–5.6)

## 2018-06-13 MED ORDER — ACCU-CHEK FASTCLIX LANCETS MISC: 5 refills | Status: DC

## 2018-06-13 MED ORDER — GLUCOSE BLOOD VI STRP: ORAL_STRIP | 3 refills | Status: DC

## 2018-06-13 NOTE — Addendum Note (Signed)
Addended by: Cardell Peach I on: 06/13/2018 09:37 AM   Modules accepted: Orders

## 2018-06-13 NOTE — Patient Instructions (Addendum)
Please use: - NPH 10 units before b'fast during the Holidays.  You can use 5-10 units of regular insulin before a larger meal.  Please come back for a follow-up appointment in 6 months.
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
## 2018-06-16 ENCOUNTER — Other Ambulatory Visit: Payer: Self-pay

## 2018-06-16 DIAGNOSIS — N401 Enlarged prostate with lower urinary tract symptoms: Secondary | ICD-10-CM | POA: Diagnosis not present

## 2018-06-16 DIAGNOSIS — N138 Other obstructive and reflux uropathy: Secondary | ICD-10-CM | POA: Diagnosis not present

## 2018-06-17 LAB — PSA: Prostate Specific Ag, Serum: <0.1 ng/mL (ref 0.0–4.0)

## 2018-06-18 ENCOUNTER — Ambulatory Visit: Payer: Medicare Other | Admitting: Urology

## 2018-06-18 ENCOUNTER — Ambulatory Visit (INDEPENDENT_AMBULATORY_CARE_PROVIDER_SITE_OTHER): Payer: Medicare Other | Admitting: Psychology

## 2018-06-18 DIAGNOSIS — F4312 Post-traumatic stress disorder, chronic: Secondary | ICD-10-CM | POA: Diagnosis not present

## 2018-06-18 DIAGNOSIS — F4323 Adjustment disorder with mixed anxiety and depressed mood: Secondary | ICD-10-CM | POA: Diagnosis not present

## 2018-06-18 NOTE — Progress Notes (Incomplete)
06/18/2018  6:58 AM   [name] [name] 07-28-1955 [id number]  Referring provider: [name], MD [place], [place] [address]  No chief complaint on file.   HPI: Patient is a 62 year old Caucasian male who returns today for the evaluation and management of BPH with LUTS. His last visit with [place] was on 06/20/2017. He is accompanied by ***.   Pt reports of ***  BPH WITH LUTS  (prostate and/or bladder) His IPSS score today is ***, which is mild lower urinary tract symptomatology. His previous IPSS score was 4 from 06/20/17. He is delighted with his quality life due to his urinary symptoms.  His major complaint today is frequency.  He has had these symptoms for several years.  He denies any dysuria, hematuria or suprapubic pain.   He currently taking tamsulosin 0.4 mg and finasteride 5 mg daily.  He also denies any recent fevers, chills, nausea or vomiting.  He does not have a family history of PCa.    Score:  1-7 Mild 8-19 Moderate 20-35 Severe    PMH: Past Medical History:  Diagnosis Date   Adenomatous colon polyp    Allergy    Angina at rest [facility])    chronic   Anxiety    Arthritis    RA   Bell's palsy 2007   Carotid artery occlusion    Cataract    Dr. Dawna Part   Coronary artery disease    Depression    Diabetes mellitus    Diverticulosis    Duodenitis    DVT (deep venous thrombosis) (HCC)    in leg   Dyspnea    DOE   Gastropathy    GERD (gastroesophageal reflux disease)    Heart murmur    Helicobacter pylori gastritis    Hyperlipidemia    Hypertension    Kidney failure    Mild aortic stenosis    Neuropathy    Obesity    Peripheral vascular disease (HCC)    Post splenectomy syndrome    Psoriasis    Psoriatic arthritis (HCC)    Renal disorder    stage 4 kidney failure   Sleep apnea    uses cpap   SOB (shortness of breath)    Stroke (HCC)    Tobacco use disorder    recently quit    Tubular adenoma 08/2013   Dr. Elsie Ra urinary stream     Surgical History: Past Surgical History:  Procedure Laterality Date   CARDIAC CATHETERIZATION  08/2010   LAD: 80% ISR, RCA: 80% ostial, OM 80-90%   CAROTID ENDARTERECTOMY  08/2010   left/ Dr. Kellie Simmering   CATARACT EXTRACTION [facility] Left 07/09/2017   Procedure: CATARACT EXTRACTION PHACO AND INTRAOCULAR LENS PLACEMENT ([place]);  Surgeon: Birder Robson, MD;  Location: [facility] ORS;  Service: Ophthalmology;  Laterality: Left;  [place] 00:23 AP% 14.2 CDE 3.26 Fluid pack lot # [id number]   CATARACT EXTRACTION W/PHACO Right 09/10/2017   Procedure: CATARACT EXTRACTION PHACO AND INTRAOCULAR LENS PLACEMENT ([facility]);  Surgeon: Birder Robson, MD;  Location: [facility] ORS;  Service: Ophthalmology;  Laterality: Right;  [place] 00:26.4 AP% 17.3 CDE 4.59 Fluid Pack Lot # [id number]   COLONOSCOPY     CORONARY ANGIOPLASTY     LAD: before CABG   CORONARY ARTERY BYPASS GRAFT  09/06/2010   At [facility]: LIMA to LAD, left radial to RCA, sequential SVG to OM3 and 4   heart stents  Jan 2011   leg stents  06/2009 and 03/2010   PTA of illiac and SFA  multiple   Dr. Ronalee Belts, s/p revision 11/2012   SPLENECTOMY     VASCULAR SURGERY     LEG STENTS    Home Medications:  Allergies as of 06/18/2018      Reactions   Contrast Media [iodinated Diagnostic Agents] Rash, Other (See Comments)   Got very hot and red   Glipizide Other (See Comments)   ANTIDIABETICS. Burning   Metrizamide Other (See Comments)   Got very hot and red   Penicillins Hives, Swelling   Has patient had a PCN reaction causing immediate rash, facial/tongue/throat swelling, SOB or lightheadedness with hypotension: yes Has patient had a PCN reaction causing severe rash involving mucus membranes or skin necrosis: no  Has patient had a PCN reaction that required hospitalization: yes Has patient had a PCN reaction occurring within the last 10 years: no If all of the above answers are "NO", then may  proceed with Cephalosporin use.      Medication List        Accurate as of 06/18/18  6:58 AM. Always use your most recent med list.          ACCU-CHEK FASTCLIX LANCETS Misc Use to check sugar 1 time daily.   albuterol 108 (90 Base) MCG/ACT inhaler Commonly known as:  PROVENTIL HFA;VENTOLIN HFA Inhale 2 puffs into the lungs every 6 (six) hours as needed for shortness of breath.   aspirin 81 MG tablet Take 81 mg by mouth daily.   buPROPion 300 MG 24 hr tablet Commonly known as:  WELLBUTRIN XL Take 1 tablet (300 mg total) by mouth daily.   busPIRone 10 MG tablet Commonly known as:  BUSPAR Take 2 tablets (20 mg total) by mouth 2 (two) times daily.   calcitRIOL 0.25 MCG capsule Commonly known as:  ROCALTROL Take 0.25 mcg by mouth 3 (three) times a week.   carvedilol 12.5 MG tablet Commonly known as:  COREG TAKE 1 TABLET BY MOUTH TWICE DAILY   Cinnamon 500 MG capsule Take by mouth.   clopidogrel 75 MG tablet Commonly known as:  PLAVIX TAKE 1 TABLET BY MOUTH ONCE DAILY   cyclobenzaprine 5 MG tablet Commonly known as:  FLEXERIL Take 1 tablet (5 mg total) by mouth 3 (three) times daily as needed for muscle spasms.   DULoxetine 60 MG capsule Commonly known as:  CYMBALTA TAKE 1 CAPSULE(60 MG) BY MOUTH DAILY   esomeprazole 20 MG capsule Commonly known as:  NEXIUM Take 1 capsule (20 mg total) by mouth 2 (two) times daily before a meal.   finasteride 5 MG tablet Commonly known as:  PROSCAR Take 5 mg by mouth daily.   furosemide 20 MG tablet Commonly known as:  LASIX Take 20 mg by mouth daily.   GLUCOSAMINE CHONDROITIN COMPLX Caps Take by mouth.   glucose blood test strip Use as instructed to test sugars one time daily DX E11.51   Icosapent Ethyl 1 g Caps Take 2 capsules (2 g total) by mouth daily.   insulin NPH Human 100 UNIT/ML injection Commonly known as:  HUMULIN N,NOVOLIN N Inject 40 units in am and 20 units in the evening   insulin regular 100  units/mL injection Commonly known as:  NOVOLIN R,HUMULIN R Inject underskin 10-30 units regular insulin three times a day before meals.   ketoconazole 2 % cream Commonly known as:  NIZORAL Apply topically 2 (two) times daily. For rash   losartan 100 MG tablet Commonly  known as:  COZAAR TAKE 1 TABLET(100 MG) BY MOUTH DAILY   MAGNESIUM PO Take 400 mg by mouth daily.   metoprolol succinate 50 MG 24 hr tablet Commonly known as:  TOPROL-XL Take by mouth.   multivitamin capsule Take by mouth.   nitroGLYCERIN 0.4 MG SL tablet Commonly known as:  NITROSTAT Place 1 tablet (0.4 mg total) under the tongue every 5 (five) minutes as needed.   omeprazole 20 MG capsule Commonly known as:  PRILOSEC Take 1 capsule (20 mg total) by mouth 2 (two) times daily.   RELION INSULIN SYRINGE 31G X 15/64" 1 ML Misc Generic drug:  Insulin Syringe-Needle U-100 ONE INJECTION THREE TIMES DAILY   REMICADE IV Inject into the vein every 6 (six) weeks.   sertraline 100 MG tablet Commonly known as:  ZOLOFT Take 2 tablets (200 mg total) by mouth daily.   simvastatin 20 MG tablet Commonly known as:  ZOCOR TAKE 1 TABLET BY MOUTH ONCE DAILY AT 6PM   tamsulosin 0.4 MG Caps capsule Commonly known as:  FLOMAX take 2 capsule by mouth daily   VITAMIN D PO Take 1 capsule by mouth daily.       Allergies:  Allergies  Allergen Reactions   Contrast Media [Iodinated Diagnostic Agents] Rash and Other (See Comments)    Got very hot and red    Glipizide Other (See Comments)    ANTIDIABETICS. Burning   Metrizamide Other (See Comments)    Got very hot and red   Penicillins Hives and Swelling    Has patient had a PCN reaction causing immediate rash, facial/tongue/throat swelling, SOB or lightheadedness with hypotension: yes Has patient had a PCN reaction causing severe rash involving mucus membranes or skin necrosis: no  Has patient had a PCN reaction that required hospitalization: yes Has patient had  a PCN reaction occurring within the last 10 years: no If all of the above answers are "NO", then may proceed with Cephalosporin use.     Family History: Family History  Problem Relation Age of Onset   Lung cancer Father 35   Arthritis Father    Breast cancer Sister 39   Alcoholism Sister    Dementia Mother    Alzheimer's disease Mother    Arthritis Mother    Alcoholism Brother    Epilepsy Sister    Diabetes Paternal Grandfather    Colon polyps Paternal Grandfather 64   Alcoholism Sister    Alcoholism Sister    Alcoholism Brother     Social History:  reports that he quit smoking about 14 months ago. His smoking use included cigarettes. He has a 30.00 pack-year smoking history. He has never used smokeless tobacco. He reports that he does not drink alcohol or use drugs.  ROS:                                        Physical Exam: There were no vitals taken for this visit.  Constitutional: Well nourished. Alert and oriented, No acute distress. HEENT: [place] AT, moist mucus membranes. Trachea midline, no masses. Cardiovascular: No clubbing, cyanosis, or edema. Respiratory: Normal respiratory effort, no increased work of breathing. GI: Abdomen is soft, non tender, non distended, no abdominal masses. Liver and spleen not palpable.  No hernias appreciated.  Stool sample for occult testing is not indicated.   GU: No CVA tenderness.  No bladder fullness or masses.  Pt  with buried phallus. Urethral meatus is patent.  No penile discharge. No penile lesions or rashes. Scrotum without lesions, cysts, rashes and/or edema.  Testicles are located scrotally bilaterally. No masses are appreciated in the testicles. Left and right epididymis are normal. Rectal: Patient with  normal sphincter tone. Anus and perineum without scarring or rashes. No rectal masses are appreciated. Prostate is approximately 45 grams, no nodules are appreciated. Seminal vesicles are  normal. Skin: No rashes, bruises or suspicious lesions. Lymph: No cervical or inguinal adenopathy. Neurologic: Grossly intact, no focal deficits, moving all 4 extremities. Psychiatric: Normal mood and affect.    Laboratory Data: Lab Results  Component Value Date   WBC 7.5 03/25/2018   HGB 12.3 (L) 03/25/2018   HCT 36.3 (L) 03/25/2018   MCV 95.4 03/25/2018   PLT 144.0 (L) 03/25/2018    Lab Results  Component Value Date   CREATININE 2.87 (H) 03/25/2018     Lab Results  Component Value Date   HGBA1C 5.8 (A) 06/13/2018    Lab Results  Component Value Date   TSH 2.170 04/11/2017       Component Value Date/Time   CHOL 137 12/13/2017 0946   CHOL 116 11/18/2017 1002   HDL 24 (L) 12/13/2017 0946   HDL 30 (L) 11/18/2017 1002   CHOLHDL 5.7 12/13/2017 0946   VLDL 54 (H) 12/13/2017 0946   LDLCALC 59 12/13/2017 0946   LDLCALC 51 11/18/2017 1002    Lab Results  Component Value Date   AST 16 12/13/2017   Lab Results  Component Value Date   ALT 16 (L) 12/13/2017   I have reviewed the labs.    Assessment & Plan:    1. BPH with LUTS  - IPSS score is 4/0  - Continue conservative management, avoiding bladder irritants and timed voiding's  - most bothersome symptoms is/are frequency  - Continue tamsulosin 0.4 mg daily and finasteride 5 mg daily  - RTC in 12 months for IPSS, PSA and exam - if today's PSA is stable ***   No follow-ups on file.  These notes generated with voice recognition software. I apologize for typographical errors.  [facility] [address] [place], [place] [address] [phone]  I, Lucas Mallow, am acting as a Education administrator for [employer],  {Add [employer] Statement}

## 2018-06-23 DIAGNOSIS — E875 Hyperkalemia: Secondary | ICD-10-CM | POA: Diagnosis not present

## 2018-06-23 DIAGNOSIS — I10 Essential (primary) hypertension: Secondary | ICD-10-CM | POA: Diagnosis not present

## 2018-06-23 DIAGNOSIS — E785 Hyperlipidemia, unspecified: Secondary | ICD-10-CM | POA: Diagnosis not present

## 2018-06-23 DIAGNOSIS — E1122 Type 2 diabetes mellitus with diabetic chronic kidney disease: Secondary | ICD-10-CM | POA: Diagnosis not present

## 2018-06-23 DIAGNOSIS — R809 Proteinuria, unspecified: Secondary | ICD-10-CM | POA: Diagnosis not present

## 2018-06-23 DIAGNOSIS — B182 Chronic viral hepatitis C: Secondary | ICD-10-CM | POA: Diagnosis not present

## 2018-06-23 DIAGNOSIS — N183 Chronic kidney disease, stage 3 (moderate): Secondary | ICD-10-CM | POA: Diagnosis not present

## 2018-06-23 DIAGNOSIS — D631 Anemia in chronic kidney disease: Secondary | ICD-10-CM | POA: Diagnosis not present

## 2018-06-24 DIAGNOSIS — M0579 Rheumatoid arthritis with rheumatoid factor of multiple sites without organ or systems involvement: Secondary | ICD-10-CM | POA: Diagnosis not present

## 2018-06-25 ENCOUNTER — Ambulatory Visit: Payer: Medicare Other | Admitting: Psychology

## 2018-06-25 DIAGNOSIS — N2581 Secondary hyperparathyroidism of renal origin: Secondary | ICD-10-CM | POA: Diagnosis not present

## 2018-06-25 DIAGNOSIS — E1122 Type 2 diabetes mellitus with diabetic chronic kidney disease: Secondary | ICD-10-CM | POA: Diagnosis not present

## 2018-06-25 DIAGNOSIS — I129 Hypertensive chronic kidney disease with stage 1 through stage 4 chronic kidney disease, or unspecified chronic kidney disease: Secondary | ICD-10-CM | POA: Diagnosis not present

## 2018-06-25 DIAGNOSIS — N184 Chronic kidney disease, stage 4 (severe): Secondary | ICD-10-CM | POA: Diagnosis not present

## 2018-06-25 DIAGNOSIS — R809 Proteinuria, unspecified: Secondary | ICD-10-CM | POA: Diagnosis not present

## 2018-06-25 DIAGNOSIS — E875 Hyperkalemia: Secondary | ICD-10-CM | POA: Diagnosis not present

## 2018-06-27 ENCOUNTER — Ambulatory Visit: Payer: Self-pay | Admitting: Internal Medicine

## 2018-07-02 ENCOUNTER — Ambulatory Visit: Payer: Medicare Other | Admitting: Psychology

## 2018-07-09 ENCOUNTER — Ambulatory Visit (INDEPENDENT_AMBULATORY_CARE_PROVIDER_SITE_OTHER): Payer: Medicare Other | Admitting: Psychology

## 2018-07-09 DIAGNOSIS — F4312 Post-traumatic stress disorder, chronic: Secondary | ICD-10-CM | POA: Diagnosis not present

## 2018-07-09 DIAGNOSIS — F4323 Adjustment disorder with mixed anxiety and depressed mood: Secondary | ICD-10-CM

## 2018-07-09 DIAGNOSIS — E1142 Type 2 diabetes mellitus with diabetic polyneuropathy: Secondary | ICD-10-CM | POA: Diagnosis not present

## 2018-07-09 DIAGNOSIS — B351 Tinea unguium: Secondary | ICD-10-CM | POA: Diagnosis not present

## 2018-07-24 ENCOUNTER — Encounter: Payer: Self-pay | Admitting: Urology

## 2018-07-25 ENCOUNTER — Encounter: Payer: Self-pay | Admitting: Internal Medicine

## 2018-07-25 ENCOUNTER — Ambulatory Visit (INDEPENDENT_AMBULATORY_CARE_PROVIDER_SITE_OTHER): Payer: Medicare Other | Admitting: Internal Medicine

## 2018-07-25 VITALS — BP 118/66 | HR 68 | Temp 97.7°F | Ht 68.0 in | Wt 247.0 lb

## 2018-07-25 DIAGNOSIS — N184 Chronic kidney disease, stage 4 (severe): Secondary | ICD-10-CM

## 2018-07-25 DIAGNOSIS — Z794 Long term (current) use of insulin: Secondary | ICD-10-CM | POA: Diagnosis not present

## 2018-07-25 DIAGNOSIS — E1151 Type 2 diabetes mellitus with diabetic peripheral angiopathy without gangrene: Secondary | ICD-10-CM | POA: Diagnosis not present

## 2018-07-25 DIAGNOSIS — F331 Major depressive disorder, recurrent, moderate: Secondary | ICD-10-CM | POA: Diagnosis not present

## 2018-07-25 DIAGNOSIS — G629 Polyneuropathy, unspecified: Secondary | ICD-10-CM

## 2018-07-25 DIAGNOSIS — E1122 Type 2 diabetes mellitus with diabetic chronic kidney disease: Secondary | ICD-10-CM | POA: Diagnosis not present

## 2018-07-25 DIAGNOSIS — D696 Thrombocytopenia, unspecified: Secondary | ICD-10-CM

## 2018-07-25 MED ORDER — DULOXETINE HCL 60 MG PO CPEP: 120.0000 mg | ORAL_CAPSULE | Freq: Every day | ORAL | 3 refills | Status: DC

## 2018-07-25 MED ORDER — CYCLOBENZAPRINE HCL 5 MG PO TABS: 5.0000 mg | ORAL_TABLET | Freq: Every evening | ORAL | 0 refills | Status: DC | PRN

## 2018-07-25 MED ORDER — FAMOTIDINE 40 MG PO TABS: 40.0000 mg | ORAL_TABLET | Freq: Two times a day (BID) | ORAL | 3 refills | Status: DC

## 2018-07-25 NOTE — Assessment & Plan Note (Signed)
Continues to follow with nephrologist

## 2018-07-25 NOTE — Assessment & Plan Note (Signed)
Has increased the duloxetine Seems somewhat better but ongoing life stress Will continue complex regimen for now

## 2018-07-25 NOTE — Assessment & Plan Note (Signed)
Multiple falls Needs to use the walker--not cane

## 2018-07-25 NOTE — Progress Notes (Signed)
Subjective:    Patient ID: [name] [name], male    DOB: June 09, 1956, 63 y.o.   MRN: [id number]  HPI Here for follow up of depression and other chronic medical conditions  Has been falling again Uses quad cane regularly but will just go down May be walking, or just looking down Totaled wife's car in October--thinks he had some kind of "seizure" Did get rollator walker---hasn't been using it regularly Now wife is driving him around  Ongoing memory problems This bothers him Reviewed neurology evaluation for cognition, etc  Ongoing knee pain Wonders about needing TKR  Ongoing depression and anxiety Has been taking 2 of the duloxetine daily due to ongoing issues  Has known CKD 4  Did improve slightly with the last renal visit  Sugars have been fine  Current Outpatient Medications on File Prior to Visit  Medication Sig Dispense Refill  . ACCU-CHEK FASTCLIX LANCETS MISC Use to check sugar 1 time daily. 100 each 5  . albuterol (PROAIR HFA) 108 (90 Base) MCG/ACT inhaler Inhale 2 puffs into the lungs every 6 (six) hours as needed for shortness of breath. 1 Inhaler 1  . aspirin 81 MG tablet Take 81 mg by mouth daily.    [name] buPROPion (WELLBUTRIN XL) 300 MG 24 hr tablet Take 1 tablet (300 mg total) by mouth daily. 90 tablet 3  . busPIRone (BUSPAR) 10 MG tablet Take 2 tablets (20 mg total) by mouth 2 (two) times daily. 360 tablet 3  . calcitRIOL (ROCALTROL) 0.25 MCG capsule Take 0.25 mcg by mouth 3 (three) times a week.    . carvedilol (COREG) 12.5 MG tablet TAKE 1 TABLET BY MOUTH TWICE DAILY 180 tablet 3  . Cholecalciferol (VITAMIN D PO) Take 1 capsule by mouth daily.     . Cinnamon 500 MG capsule Take by mouth.    . clopidogrel (PLAVIX) 75 MG tablet TAKE 1 TABLET BY MOUTH ONCE DAILY 90 tablet 2  . cyclobenzaprine (FLEXERIL) 5 MG tablet Take 1 tablet (5 mg total) by mouth 3 (three) times daily as needed for muscle spasms. 15 tablet 0  . DULoxetine (CYMBALTA) 60 MG capsule TAKE 1  CAPSULE(60 MG) BY MOUTH DAILY 90 capsule 1  . esomeprazole (NEXIUM) 20 MG capsule Take 1 capsule (20 mg total) by mouth 2 (two) times daily before a meal. 180 capsule 3  . finasteride (PROSCAR) 5 MG tablet Take 5 mg by mouth daily.    . furosemide (LASIX) 20 MG tablet Take 20 mg by mouth daily.    . Glucosamine-Chondroit-Vit C-Mn (GLUCOSAMINE CHONDROITIN COMPLX) CAPS Take by mouth.    [name] glucose blood test strip Use as instructed to test sugars one time daily DX E11.51 100 each 3  . Icosapent Ethyl (VASCEPA) 1 g CAPS Take 2 capsules (2 g total) by mouth daily. 120 capsule 5  . InFLIXimab (REMICADE IV) Inject into the vein every 6 (six) weeks.    . insulin NPH Human (NOVOLIN N RELION) 100 UNIT/ML injection Inject 40 units in am and 20 units in the evening (Patient taking differently: Inject 20 Units into the skin daily as needed (High Blood Sugar). ) 40 mL 2  . insulin regular (NOVOLIN R,HUMULIN R) 100 units/mL injection Inject underskin 10-30 units regular insulin three times a day before meals. 40 mL 5  . ketoconazole (NIZORAL) 2 % cream Apply topically 2 (two) times daily. For rash 15 g 0  . losartan (COZAAR) 100 MG tablet TAKE 1 TABLET(100 MG) BY MOUTH DAILY  30 tablet 2  . MAGNESIUM PO Take 400 mg by mouth daily.     . metoprolol succinate (TOPROL-XL) 50 MG 24 hr tablet Take by mouth.    . Multiple Vitamin (MULTIVITAMIN) capsule Take by mouth.    . nitroGLYCERIN (NITROSTAT) 0.4 MG SL tablet Place 1 tablet (0.4 mg total) under the tongue every 5 (five) minutes as needed. 25 tablet 1  . omeprazole (PRILOSEC) 20 MG capsule Take 1 capsule (20 mg total) by mouth 2 (two) times daily. (Patient taking differently: Take 20 mg by mouth 2 (two) times daily as needed (HEARTBURN). ) 20 capsule 0  . RELION INSULIN SYRINGE 31G X 15/64" 1 ML MISC ONE INJECTION THREE TIMES DAILY 200 each 2  . sertraline (ZOLOFT) 100 MG tablet Take 2 tablets (200 mg total) by mouth daily. 180 tablet 3  . simvastatin (ZOCOR) 20 MG  tablet TAKE 1 TABLET BY MOUTH ONCE DAILY AT 6PM 90 tablet 3  . tamsulosin (FLOMAX) 0.4 MG CAPS capsule take 2 capsule by mouth daily (Patient taking differently: Take 0.4 mg by mouth 2 (two) times daily. ) 60 capsule 11   No current facility-administered medications on file prior to visit.     Allergies  Allergen Reactions  . Contrast Media [Iodinated Diagnostic Agents] Rash and Other (See Comments)    Got very hot and red   . Glipizide Other (See Comments)    ANTIDIABETICS. Burning  . Metrizamide Other (See Comments)    Got very hot and red  . Penicillins Hives and Swelling    Has patient had a PCN reaction causing immediate rash, facial/tongue/throat swelling, SOB or lightheadedness with hypotension: yes Has patient had a PCN reaction causing severe rash involving mucus membranes or skin necrosis: no  Has patient had a PCN reaction that required hospitalization: yes Has patient had a PCN reaction occurring within the last 10 years: no If all of the above answers are "NO", then may proceed with Cephalosporin use.     Past Medical History:  Diagnosis Date  . Adenomatous colon polyp   . Allergy   . Angina at rest [facility])    chronic  . Anxiety   . Arthritis    RA  . Bell's palsy 2007  . Carotid artery occlusion   . Cataract    Dr. Dawna Part  . Coronary artery disease   . Depression   . Diabetes mellitus   . Diverticulosis   . Duodenitis   . DVT (deep venous thrombosis) (HCC)    in leg  . Dyspnea    DOE  . Gastropathy   . GERD (gastroesophageal reflux disease)   . Heart murmur   . Helicobacter pylori gastritis   . Hyperlipidemia   . Hypertension   . Kidney failure   . Mild aortic stenosis   . Neuropathy   . Obesity   . Peripheral vascular disease ([place])   . Post splenectomy syndrome   . Psoriasis   . Psoriatic arthritis ([place])   . Renal disorder    stage 4 kidney failure  . Sleep apnea    uses cpap  . SOB (shortness of breath)   . Stroke ([place])   . Tobacco use  disorder    recently quit  . Tubular adenoma 08/2013   Dr. Hilarie Fredrickson  . Weak urinary stream     Past Surgical History:  Procedure Laterality Date  . CARDIAC CATHETERIZATION  08/2010   LAD: 80% ISR, RCA: 80% ostial, OM 80-90%  . CAROTID  ENDARTERECTOMY  08/2010   left/ Dr. Kellie Simmering  . CATARACT EXTRACTION W/PHACO Left 07/09/2017   Procedure: CATARACT EXTRACTION PHACO AND INTRAOCULAR LENS PLACEMENT ([facility]);  Surgeon: Birder Robson, MD;  Location: [facility] ORS;  Service: Ophthalmology;  Laterality: Left;  [place] 00:23 AP% 14.2 CDE 3.26 Fluid pack lot # [id number]  . CATARACT EXTRACTION W/PHACO Right 09/10/2017   Procedure: CATARACT EXTRACTION PHACO AND INTRAOCULAR LENS PLACEMENT ([facility]);  Surgeon: Birder Robson, MD;  Location: [facility] ORS;  Service: Ophthalmology;  Laterality: Right;  [place] 00:26.4 AP% 17.3 CDE 4.59 Fluid Pack Lot # [id number]  . COLONOSCOPY    . CORONARY ANGIOPLASTY     LAD: before CABG  . CORONARY ARTERY BYPASS GRAFT  09/06/2010   At [facility]: LIMA to LAD, left radial to RCA, sequential SVG to OM3 and 4  . heart stents  Jan 2011   leg stents 06/2009 and 03/2010  . PTA of illiac and SFA  multiple   Dr. Ronalee Belts, s/p revision 11/2012  . SPLENECTOMY    . VASCULAR SURGERY     LEG STENTS    Family History  Problem Relation Age of Onset  . Lung cancer Father 59  . Arthritis Father   . Breast cancer Sister 37  . Alcoholism Sister   . Dementia Mother   . Alzheimer's disease Mother   . Arthritis Mother   . Alcoholism Brother   . Epilepsy Sister   . Diabetes Paternal Grandfather   . Colon polyps Paternal Grandfather 32  . Alcoholism Sister   . Alcoholism Sister   . Alcoholism Brother     Social History   Socioeconomic History  . Marital status: Married    Spouse name: Not on file  . Number of children: 0  . Years of education: Not on file  . Highest education level: Not on file  Occupational History  . Occupation: Chief Operating Officer at [employer]: Disabled mostly due to  neuropathy  Social Needs  . Financial resource strain: Not on file  . Food insecurity:    Worry: Not on file    Inability: Not on file  . Transportation needs:    Medical: Not on file    Non-medical: Not on file  Tobacco Use  . Smoking status: Former Smoker    Packs/day: 0.75    Years: 40.00    Pack years: 30.00    Types: Cigarettes    Last attempt to quit: 04/15/2017    Years since quitting: 1.2  . Smokeless tobacco: Never Used  . Tobacco comment: 1 cigarette a day  Substance and Sexual Activity  . Alcohol use: No  . Drug use: No  . Sexual activity: Never  Lifestyle  . Physical activity:    Days per week: Not on file    Minutes per session: Not on file  . Stress: Not on file  Relationships  . Social connections:    Talks on phone: Not on file    Gets together: Not on file    Attends religious service: Not on file    Active member of club or organization: Not on file    Attends meetings of clubs or organizations: Not on file    Relationship status: Not on file  . Intimate partner violence:    Fear of current or ex partner: Not on file    Emotionally abused: Not on file    Physically abused: Not on file    Forced sexual activity: Not on file  Other Topics  Concern  . Not on file  Social History Narrative   Has living will   Wife is health care POA---brother Ronalee Belts is alternate   Would accept resuscitation --but no prolonged ventilation   No tube feeds if cognitively unaware   Review of Systems Not sleeping well due to flank pain Uses the cyclobenzaprine Insurance wouldn't cover the omeprazole Bruising easily---does have borderline low platelets    Objective:   Physical Exam  Constitutional: No distress.  Neck: No thyromegaly present.  Cardiovascular: Normal rate, regular rhythm and normal heart sounds. Exam reveals no gallop.  No murmur heard. Respiratory: Effort normal and breath sounds normal. No respiratory distress. He has no wheezes. He has no rales.    Musculoskeletal:        General: No edema.  Lymphadenopathy:    He has no cervical adenopathy.  Skin:  Bruise along right chest  Psychiatric:  Mood is neutral---voices frustration, etc           Assessment & Plan:

## 2018-07-25 NOTE — Assessment & Plan Note (Signed)
Easy bruising but likely due to CKD--not mild low platelets

## 2018-07-25 NOTE — Assessment & Plan Note (Signed)
Continues with Dr Renne Crigler Also with severe neuropathy Multiple falls---needs to use walker now

## 2018-07-28 NOTE — Progress Notes (Deleted)
07/28/2018  9:06 PM   [name] [name] Janee Morn [birth year] [id number]  Referring provider: [name], MD [place], [place] [address]  No chief complaint on file.   HPI: Patient is a 63 year old Caucasian male who returns today for the evaluation and management of BPH with LUTS. His last visit with [place] was on 06/20/2017. He is accompanied by ***.   Pt reports of ***  BPH WITH LUTS  (prostate and/or bladder) His IPSS score today is ***, which is *** lower urinary tract symptomatology.   He is *** with his quality life due to his urinary symptoms.  His previous I PSS score was 4/0.  His major complaint today is frequency.  He has had these symptoms for several years.  He denies any dysuria, hematuria or suprapubic pain.  He currently taking tamsulosin 0.4 mg and finasteride 5 mg daily.  He also denies any recent fevers, chills, nausea or vomiting.  He does not have a family history of PCa.    Score:  1-7 Mild 8-19 Moderate 20-35 Severe    PMH: Past Medical History:  Diagnosis Date  . Adenomatous colon polyp   . Allergy   . Angina at rest [facility])    chronic  . Anxiety   . Arthritis    RA  . Bell's palsy 2007  . Carotid artery occlusion   . Cataract    Dr. Dawna Part  . Coronary artery disease   . Depression   . Diabetes mellitus   . Diverticulosis   . Duodenitis   . DVT (deep venous thrombosis) (HCC)    in leg  . Dyspnea    DOE  . Gastropathy   . GERD (gastroesophageal reflux disease)   . Heart murmur   . Helicobacter pylori gastritis   . Hyperlipidemia   . Hypertension   . Kidney failure   . Mild aortic stenosis   . Neuropathy   . Obesity   . Peripheral vascular disease ([place])   . Post splenectomy syndrome   . Psoriasis   . Psoriatic arthritis ([place])   . Renal disorder    stage 4 kidney failure  . Sleep apnea    uses cpap  . SOB (shortness of breath)   . Stroke ([place])   . Tobacco use disorder    recently quit  . Tubular adenoma 08/2013   Dr. Hilarie Fredrickson  . Weak urinary stream     Surgical History: Past Surgical History:  Procedure Laterality Date  . CARDIAC CATHETERIZATION  08/2010   LAD: 80% ISR, RCA: 80% ostial, OM 80-90%  . CAROTID ENDARTERECTOMY  08/2010   left/ Dr. Kellie Simmering  . CATARACT EXTRACTION W/PHACO Left 07/09/2017   Procedure: CATARACT EXTRACTION PHACO AND INTRAOCULAR LENS PLACEMENT (IOC);  Surgeon: Birder Robson, MD;  Location: [facility] ORS;  Service: Ophthalmology;  Laterality: Left;  [place] 00:23 AP% 14.2 CDE 3.26 Fluid pack lot # [id number]  . CATARACT EXTRACTION W/PHACO Right 09/10/2017   Procedure: CATARACT EXTRACTION PHACO AND INTRAOCULAR LENS PLACEMENT (IOC);  Surgeon: Birder Robson, MD;  Location: [facility] ORS;  Service: Ophthalmology;  Laterality: Right;  [place] 00:26.4 AP% 17.3 CDE 4.59 Fluid Pack Lot # [id number]  . COLONOSCOPY    . CORONARY ANGIOPLASTY     LAD: before CABG  . CORONARY ARTERY BYPASS GRAFT  09/06/2010   At [facility]: LIMA to LAD, left radial to RCA, sequential SVG to OM3 and 4  . heart stents  Jan 2011   leg stents  06/2009 and 03/2010  . PTA of illiac and SFA  multiple   Dr. Ronalee Belts, s/p revision 11/2012  . SPLENECTOMY    . VASCULAR SURGERY     LEG STENTS    Home Medications:  Allergies as of 07/29/2018      Reactions   Contrast Media [iodinated Diagnostic Agents] Rash, Other (See Comments)   Got very hot and red   Glipizide Other (See Comments)   ANTIDIABETICS. Burning   Metrizamide Other (See Comments)   Got very hot and red   Penicillins Hives, Swelling   Has patient had a PCN reaction causing immediate rash, facial/tongue/throat swelling, SOB or lightheadedness with hypotension: yes Has patient had a PCN reaction causing severe rash involving mucus membranes or skin necrosis: no  Has patient had a PCN reaction that required hospitalization: yes Has patient had a PCN reaction occurring within the last 10 years: no If all of the above answers are "NO", then may proceed with Cephalosporin  use.      Medication List       Accurate as of July 28, 2018  9:06 PM. Always use your most recent med list.        ACCU-CHEK FASTCLIX LANCETS Misc Use to check sugar 1 time daily.   albuterol 108 (90 Base) MCG/ACT inhaler Commonly known as:  PROAIR HFA Inhale 2 puffs into the lungs every 6 (six) hours as needed for shortness of breath.   aspirin 81 MG tablet Take 81 mg by mouth daily.   buPROPion 300 MG 24 hr tablet Commonly known as:  WELLBUTRIN XL Take 1 tablet (300 mg total) by mouth daily.   busPIRone 10 MG tablet Commonly known as:  BUSPAR Take 2 tablets (20 mg total) by mouth 2 (two) times daily.   calcitRIOL 0.25 MCG capsule Commonly known as:  ROCALTROL Take 0.25 mcg by mouth 3 (three) times a week.   carvedilol 12.5 MG tablet Commonly known as:  COREG TAKE 1 TABLET BY MOUTH TWICE DAILY   Cinnamon 500 MG capsule Take by mouth.   clopidogrel 75 MG tablet Commonly known as:  PLAVIX TAKE 1 TABLET BY MOUTH ONCE DAILY   cyclobenzaprine 5 MG tablet Commonly known as:  FLEXERIL Take 1-2 tablets (5-10 mg total) by mouth at bedtime as needed for muscle spasms.   DULoxetine 60 MG capsule Commonly known as:  CYMBALTA Take 2 capsules (120 mg total) by mouth daily.   esomeprazole 20 MG capsule Commonly known as:  NEXIUM Take 1 capsule (20 mg total) by mouth 2 (two) times daily before a meal.   famotidine 40 MG tablet Commonly known as:  PEPCID Take 1 tablet (40 mg total) by mouth 2 (two) times daily.   finasteride 5 MG tablet Commonly known as:  PROSCAR Take 5 mg by mouth daily.   furosemide 20 MG tablet Commonly known as:  LASIX Take 20 mg by mouth daily.   GLUCOSAMINE CHONDROITIN COMPLX Caps Take by mouth.   glucose blood test strip Use as instructed to test sugars one time daily DX E11.51   Icosapent Ethyl 1 g Caps Commonly known as:  VASCEPA Take 2 capsules (2 g total) by mouth daily.   insulin NPH Human 100 UNIT/ML injection Commonly  known as:  NOVOLIN N RELION Inject 40 units in am and 20 units in the evening   insulin regular 100 units/mL injection Commonly known as:  NOVOLIN R,HUMULIN R Inject underskin 10-30 units regular insulin three times a day before meals.  ketoconazole 2 % cream Commonly known as:  NIZORAL Apply topically 2 (two) times daily. For rash   losartan 100 MG tablet Commonly known as:  COZAAR TAKE 1 TABLET(100 MG) BY MOUTH DAILY   MAGNESIUM PO Take 400 mg by mouth daily.   metoprolol succinate 50 MG 24 hr tablet Commonly known as:  TOPROL-XL Take by mouth.   multivitamin capsule Take by mouth.   nitroGLYCERIN 0.4 MG SL tablet Commonly known as:  NITROSTAT Place 1 tablet (0.4 mg total) under the tongue every 5 (five) minutes as needed.   RELION INSULIN SYRINGE 31G X 15/64" 1 ML Misc Generic drug:  Insulin Syringe-Needle U-100 ONE INJECTION THREE TIMES DAILY   REMICADE IV Inject into the vein every 6 (six) weeks.   sertraline 100 MG tablet Commonly known as:  ZOLOFT Take 2 tablets (200 mg total) by mouth daily.   simvastatin 20 MG tablet Commonly known as:  ZOCOR TAKE 1 TABLET BY MOUTH ONCE DAILY AT 6PM   tamsulosin 0.4 MG Caps capsule Commonly known as:  FLOMAX take 2 capsule by mouth daily   VITAMIN D PO Take 1 capsule by mouth daily.       Allergies:  Allergies  Allergen Reactions  . Contrast Media [Iodinated Diagnostic Agents] Rash and Other (See Comments)    Got very hot and red   . Glipizide Other (See Comments)    ANTIDIABETICS. Burning  . Metrizamide Other (See Comments)    Got very hot and red  . Penicillins Hives and Swelling    Has patient had a PCN reaction causing immediate rash, facial/tongue/throat swelling, SOB or lightheadedness with hypotension: yes Has patient had a PCN reaction causing severe rash involving mucus membranes or skin necrosis: no  Has patient had a PCN reaction that required hospitalization: yes Has patient had a PCN reaction  occurring within the last 10 years: no If all of the above answers are "NO", then may proceed with Cephalosporin use.     Family History: Family History  Problem Relation Age of Onset  . Lung cancer Father 89  . Arthritis Father   . Breast cancer Sister 6  . Alcoholism Sister   . Dementia Mother   . Alzheimer's disease Mother   . Arthritis Mother   . Alcoholism Brother   . Epilepsy Sister   . Diabetes Paternal Grandfather   . Colon polyps Paternal Grandfather 58  . Alcoholism Sister   . Alcoholism Sister   . Alcoholism Brother     Social History:  reports that he quit smoking about 15 months ago. His smoking use included cigarettes. He has a 30.00 pack-year smoking history. He has never used smokeless tobacco. He reports that he does not drink alcohol or use drugs.  ROS:                                        Physical Exam: There were no vitals taken for this visit.  Constitutional:  Well nourished. Alert and oriented, No acute distress. HEENT: [place] AT, moist mucus membranes.  Trachea midline, no masses. Cardiovascular: No clubbing, cyanosis, or edema. Respiratory: Normal respiratory effort, no increased work of breathing. GI: Abdomen is soft, non tender, non distended, no abdominal masses. Liver and spleen not palpable.  No hernias appreciated.  Stool sample for occult testing is not indicated.   GU: No CVA tenderness.  No bladder fullness or  masses.  Patient with circumcised/uncircumcised phallus. ***Foreskin easily retracted***  Urethral meatus is patent.  No penile discharge. No penile lesions or rashes. Scrotum without lesions, cysts, rashes and/or edema.  Testicles are located scrotally bilaterally. No masses are appreciated in the testicles. Left and right epididymis are normal. Rectal: Patient with  normal sphincter tone. Anus and perineum without scarring or rashes. No rectal masses are appreciated. Prostate is approximately *** grams, *** nodules  are appreciated. Seminal vesicles are normal. Skin: No rashes, bruises or suspicious lesions. Lymph: No cervical or inguinal adenopathy. Neurologic: Grossly intact, no focal deficits, moving all 4 extremities. Psychiatric: Normal mood and affect.   Laboratory Data: PSA  0.1 in 05/2018  <0.1 in 06/2018 Lab Results  Component Value Date   WBC 7.5 03/25/2018   HGB 12.3 (L) 03/25/2018   HCT 36.3 (L) 03/25/2018   MCV 95.4 03/25/2018   PLT 144.0 (L) 03/25/2018    Lab Results  Component Value Date   CREATININE 2.87 (H) 03/25/2018     Lab Results  Component Value Date   HGBA1C 5.8 (A) 06/13/2018    Lab Results  Component Value Date   TSH 2.170 04/11/2017       Component Value Date/Time   CHOL 137 12/13/2017 0946   CHOL 116 11/18/2017 1002   HDL 24 (L) 12/13/2017 0946   HDL 30 (L) 11/18/2017 1002   CHOLHDL 5.7 12/13/2017 0946   VLDL 54 (H) 12/13/2017 0946   LDLCALC 59 12/13/2017 0946   LDLCALC 51 11/18/2017 1002    Lab Results  Component Value Date   AST 16 12/13/2017   Lab Results  Component Value Date   ALT 16 (L) 12/13/2017   I have reviewed the labs.    Assessment & Plan:    1. BPH with LUTS  - IPSS score is 4/0  - Continue conservative management, avoiding bladder irritants and timed voiding's  - most bothersome symptoms is/are frequency  - Continue tamsulosin 0.4 mg daily and finasteride 5 mg daily  - RTC in 12 months for IPSS, PSA and exam - if today's PSA is stable ***   No follow-ups on file.  These notes generated with voice recognition software. I apologize for typographical errors.  Zara Council, PA-C  [facility] [address] [place], [place] [address] [phone]

## 2018-07-29 ENCOUNTER — Ambulatory Visit: Payer: Medicare Other | Admitting: Urology

## 2018-07-30 ENCOUNTER — Ambulatory Visit: Payer: Medicare Other | Admitting: Psychology

## 2018-08-06 ENCOUNTER — Ambulatory Visit: Payer: Medicare Other | Admitting: Psychology

## 2018-08-10 NOTE — Progress Notes (Deleted)
08/10/2018  9:45 PM   [name] [name] [birth year] [id number]  Referring provider: [name], MD [place], [place] [address]  No chief complaint on file.   HPI: Patient is a 63 year old Caucasian male who returns today for the evaluation and management of BPH with LUTS. His last visit with [place] was on 06/20/2017. He is accompanied by ***.   Pt reports of ***  BPH WITH LUTS  (prostate and/or bladder) His IPSS score today is ***, which is *** lower urinary tract symptomatology.   He is *** with his quality life due to his urinary symptoms.  His previous I PSS score was 4/0.  His major complaint today is frequency.  He has had these symptoms for several years.  He denies any dysuria, hematuria or suprapubic pain.  He currently taking tamsulosin 0.4 mg and finasteride 5 mg daily.  He also denies any recent fevers, chills, nausea or vomiting.  He does not have a family history of PCa.    Score:  1-7 Mild 8-19 Moderate 20-35 Severe    PMH: Past Medical History:  Diagnosis Date  . Adenomatous colon polyp   . Allergy   . Angina at rest [facility])    chronic  . Anxiety   . Arthritis    RA  . Bell's palsy 2007  . Carotid artery occlusion   . Cataract    Dr. Dawna Part  . Coronary artery disease   . Depression   . Diabetes mellitus   . Diverticulosis   . Duodenitis   . DVT (deep venous thrombosis) (HCC)    in leg  . Dyspnea    DOE  . Gastropathy   . GERD (gastroesophageal reflux disease)   . Heart murmur   . Helicobacter pylori gastritis   . Hyperlipidemia   . Hypertension   . Kidney failure   . Mild aortic stenosis   . Neuropathy   . Obesity   . Peripheral vascular disease ([place])   . Post splenectomy syndrome   . Psoriasis   . Psoriatic arthritis ([place])   . Renal disorder    stage 4 kidney failure  . Sleep apnea    uses cpap  . SOB (shortness of breath)   . Stroke ([place])   . Tobacco use disorder    recently quit  . Tubular adenoma 08/2013     Dr. Hilarie Fredrickson  . Weak urinary stream     Surgical History: Past Surgical History:  Procedure Laterality Date  . CARDIAC CATHETERIZATION  08/2010   LAD: 80% ISR, RCA: 80% ostial, OM 80-90%  . CAROTID ENDARTERECTOMY  08/2010   left/ Dr. Kellie Simmering  . CATARACT EXTRACTION W/PHACO Left 07/09/2017   Procedure: CATARACT EXTRACTION PHACO AND INTRAOCULAR LENS PLACEMENT (IOC);  Surgeon: Birder Robson, MD;  Location: [facility] ORS;  Service: Ophthalmology;  Laterality: Left;  [place] 00:23 AP% 14.2 CDE 3.26 Fluid pack lot # [id number]  . CATARACT EXTRACTION W/PHACO Right 09/10/2017   Procedure: CATARACT EXTRACTION PHACO AND INTRAOCULAR LENS PLACEMENT (IOC);  Surgeon: Birder Robson, MD;  Location: [facility] ORS;  Service: Ophthalmology;  Laterality: Right;  [place] 00:26.4 AP% 17.3 CDE 4.59 Fluid Pack Lot # [id number]  . COLONOSCOPY    . CORONARY ANGIOPLASTY     LAD: before CABG  . CORONARY ARTERY BYPASS GRAFT  09/06/2010   At [facility]: LIMA to LAD, left radial to RCA, sequential SVG to OM3 and 4  . heart stents  Jan 2011  leg stents 06/2009 and 03/2010  . PTA of illiac and SFA  multiple   Dr. Ronalee Belts, s/p revision 11/2012  . SPLENECTOMY    . VASCULAR SURGERY     LEG STENTS    Home Medications:  Allergies as of 08/11/2018      Reactions   Contrast Media [iodinated Diagnostic Agents] Rash, Other (See Comments)   Got very hot and red   Glipizide Other (See Comments)   ANTIDIABETICS. Burning   Metrizamide Other (See Comments)   Got very hot and red   Penicillins Hives, Swelling   Has patient had a PCN reaction causing immediate rash, facial/tongue/throat swelling, SOB or lightheadedness with hypotension: yes Has patient had a PCN reaction causing severe rash involving mucus membranes or skin necrosis: no  Has patient had a PCN reaction that required hospitalization: yes Has patient had a PCN reaction occurring within the last 10 years: no If all of the above answers are "NO", then may proceed with  Cephalosporin use.      Medication List       Accurate as of August 10, 2018  9:45 PM. Always use your most recent med list.        ACCU-CHEK FASTCLIX LANCETS Misc Use to check sugar 1 time daily.   albuterol 108 (90 Base) MCG/ACT inhaler Commonly known as:  PROAIR HFA Inhale 2 puffs into the lungs every 6 (six) hours as needed for shortness of breath.   aspirin 81 MG tablet Take 81 mg by mouth daily.   buPROPion 300 MG 24 hr tablet Commonly known as:  WELLBUTRIN XL Take 1 tablet (300 mg total) by mouth daily.   busPIRone 10 MG tablet Commonly known as:  BUSPAR Take 2 tablets (20 mg total) by mouth 2 (two) times daily.   calcitRIOL 0.25 MCG capsule Commonly known as:  ROCALTROL Take 0.25 mcg by mouth 3 (three) times a week.   carvedilol 12.5 MG tablet Commonly known as:  COREG TAKE 1 TABLET BY MOUTH TWICE DAILY   Cinnamon 500 MG capsule Take by mouth.   clopidogrel 75 MG tablet Commonly known as:  PLAVIX TAKE 1 TABLET BY MOUTH ONCE DAILY   cyclobenzaprine 5 MG tablet Commonly known as:  FLEXERIL Take 1-2 tablets (5-10 mg total) by mouth at bedtime as needed for muscle spasms.   DULoxetine 60 MG capsule Commonly known as:  CYMBALTA Take 2 capsules (120 mg total) by mouth daily.   esomeprazole 20 MG capsule Commonly known as:  NEXIUM Take 1 capsule (20 mg total) by mouth 2 (two) times daily before a meal.   famotidine 40 MG tablet Commonly known as:  PEPCID Take 1 tablet (40 mg total) by mouth 2 (two) times daily.   finasteride 5 MG tablet Commonly known as:  PROSCAR Take 5 mg by mouth daily.   furosemide 20 MG tablet Commonly known as:  LASIX Take 20 mg by mouth daily.   GLUCOSAMINE CHONDROITIN COMPLX Caps Take by mouth.   glucose blood test strip Use as instructed to test sugars one time daily DX E11.51   Icosapent Ethyl 1 g Caps Commonly known as:  VASCEPA Take 2 capsules (2 g total) by mouth daily.   insulin NPH Human 100 UNIT/ML  injection Commonly known as:  NOVOLIN N RELION Inject 40 units in am and 20 units in the evening   insulin regular 100 units/mL injection Commonly known as:  NOVOLIN R,HUMULIN R Inject underskin 10-30 units regular insulin three times a day  before meals.   ketoconazole 2 % cream Commonly known as:  NIZORAL Apply topically 2 (two) times daily. For rash   losartan 100 MG tablet Commonly known as:  COZAAR TAKE 1 TABLET(100 MG) BY MOUTH DAILY   MAGNESIUM PO Take 400 mg by mouth daily.   metoprolol succinate 50 MG 24 hr tablet Commonly known as:  TOPROL-XL Take by mouth.   multivitamin capsule Take by mouth.   nitroGLYCERIN 0.4 MG SL tablet Commonly known as:  NITROSTAT Place 1 tablet (0.4 mg total) under the tongue every 5 (five) minutes as needed.   RELION INSULIN SYRINGE 31G X 15/64" 1 ML Misc Generic drug:  Insulin Syringe-Needle U-100 ONE INJECTION THREE TIMES DAILY   REMICADE IV Inject into the vein every 6 (six) weeks.   sertraline 100 MG tablet Commonly known as:  ZOLOFT Take 2 tablets (200 mg total) by mouth daily.   simvastatin 20 MG tablet Commonly known as:  ZOCOR TAKE 1 TABLET BY MOUTH ONCE DAILY AT 6PM   tamsulosin 0.4 MG Caps capsule Commonly known as:  FLOMAX take 2 capsule by mouth daily   VITAMIN D PO Take 1 capsule by mouth daily.       Allergies:  Allergies  Allergen Reactions  . Contrast Media [Iodinated Diagnostic Agents] Rash and Other (See Comments)    Got very hot and red   . Glipizide Other (See Comments)    ANTIDIABETICS. Burning  . Metrizamide Other (See Comments)    Got very hot and red  . Penicillins Hives and Swelling    Has patient had a PCN reaction causing immediate rash, facial/tongue/throat swelling, SOB or lightheadedness with hypotension: yes Has patient had a PCN reaction causing severe rash involving mucus membranes or skin necrosis: no  Has patient had a PCN reaction that required hospitalization: yes Has patient  had a PCN reaction occurring within the last 10 years: no If all of the above answers are "NO", then may proceed with Cephalosporin use.     Family History: Family History  Problem Relation Age of Onset  . Lung cancer Father 77  . Arthritis Father   . Breast cancer Sister 46  . Alcoholism Sister   . Dementia Mother   . Alzheimer's disease Mother   . Arthritis Mother   . Alcoholism Brother   . Epilepsy Sister   . Diabetes Paternal Grandfather   . Colon polyps Paternal Grandfather 75  . Alcoholism Sister   . Alcoholism Sister   . Alcoholism Brother     Social History:  reports that he quit smoking about 15 months ago. His smoking use included cigarettes. He has a 30.00 pack-year smoking history. He has never used smokeless tobacco. He reports that he does not drink alcohol or use drugs.  ROS:                                        Physical Exam: There were no vitals taken for this visit.  Constitutional:  Well nourished. Alert and oriented, No acute distress. HEENT: [place] AT, moist mucus membranes.  Trachea midline, no masses. Cardiovascular: No clubbing, cyanosis, or edema. Respiratory: Normal respiratory effort, no increased work of breathing. GI: Abdomen is soft, non tender, non distended, no abdominal masses. Liver and spleen not palpable.  No hernias appreciated.  Stool sample for occult testing is not indicated.   GU: No CVA tenderness.  No bladder fullness or masses.  Patient with circumcised/uncircumcised phallus. ***Foreskin easily retracted***  Urethral meatus is patent.  No penile discharge. No penile lesions or rashes. Scrotum without lesions, cysts, rashes and/or edema.  Testicles are located scrotally bilaterally. No masses are appreciated in the testicles. Left and right epididymis are normal. Rectal: Patient with  normal sphincter tone. Anus and perineum without scarring or rashes. No rectal masses are appreciated. Prostate is approximately ***  grams, *** nodules are appreciated. Seminal vesicles are normal. Skin: No rashes, bruises or suspicious lesions. Lymph: No cervical or inguinal adenopathy. Neurologic: Grossly intact, no focal deficits, moving all 4 extremities. Psychiatric: Normal mood and affect.   Laboratory Data: PSA  0.1 in 05/2018  <0.1 in 06/2018 Lab Results  Component Value Date   WBC 7.5 03/25/2018   HGB 12.3 (L) 03/25/2018   HCT 36.3 (L) 03/25/2018   MCV 95.4 03/25/2018   PLT 144.0 (L) 03/25/2018    Lab Results  Component Value Date   CREATININE 2.87 (H) 03/25/2018     Lab Results  Component Value Date   HGBA1C 5.8 (A) 06/13/2018    Lab Results  Component Value Date   TSH 2.170 04/11/2017       Component Value Date/Time   CHOL 137 12/13/2017 0946   CHOL 116 11/18/2017 1002   HDL 24 (L) 12/13/2017 0946   HDL 30 (L) 11/18/2017 1002   CHOLHDL 5.7 12/13/2017 0946   VLDL 54 (H) 12/13/2017 0946   LDLCALC 59 12/13/2017 0946   LDLCALC 51 11/18/2017 1002    Lab Results  Component Value Date   AST 16 12/13/2017   Lab Results  Component Value Date   ALT 16 (L) 12/13/2017   I have reviewed the labs.    Assessment & Plan:    1. BPH with LUTS  - IPSS score is 4/0  - Continue conservative management, avoiding bladder irritants and timed voiding's  - most bothersome symptoms is/are frequency  - Continue tamsulosin 0.4 mg daily and finasteride 5 mg daily  - RTC in 12 months for IPSS, PSA and exam - if today's PSA is stable ***   No follow-ups on file.  These notes generated with voice recognition software. I apologize for typographical errors.  Zara Council, PA-C  [facility] [address] [place], [place] [address] [phone]

## 2018-08-11 ENCOUNTER — Ambulatory Visit: Payer: Self-pay | Admitting: Urology

## 2018-08-20 ENCOUNTER — Ambulatory Visit: Payer: Medicare Other | Admitting: Psychology

## 2018-08-26 ENCOUNTER — Ambulatory Visit (INDEPENDENT_AMBULATORY_CARE_PROVIDER_SITE_OTHER): Payer: Medicare Other | Admitting: Cardiovascular Disease

## 2018-08-26 ENCOUNTER — Encounter: Payer: Self-pay | Admitting: Cardiovascular Disease

## 2018-08-26 VITALS — BP 110/74 | HR 93 | Ht 66.5 in | Wt 247.0 lb

## 2018-08-26 DIAGNOSIS — I350 Nonrheumatic aortic (valve) stenosis: Secondary | ICD-10-CM

## 2018-08-26 DIAGNOSIS — E785 Hyperlipidemia, unspecified: Secondary | ICD-10-CM

## 2018-08-26 DIAGNOSIS — I739 Peripheral vascular disease, unspecified: Secondary | ICD-10-CM

## 2018-08-26 DIAGNOSIS — I10 Essential (primary) hypertension: Secondary | ICD-10-CM | POA: Diagnosis not present

## 2018-08-26 DIAGNOSIS — I779 Disorder of arteries and arterioles, unspecified: Secondary | ICD-10-CM

## 2018-08-26 DIAGNOSIS — I25118 Atherosclerotic heart disease of native coronary artery with other forms of angina pectoris: Secondary | ICD-10-CM | POA: Diagnosis not present

## 2018-08-26 MED ORDER — ALBUTEROL SULFATE HFA 108 (90 BASE) MCG/ACT IN AERS: 2.0000 | INHALATION_SPRAY | Freq: Four times a day (QID) | RESPIRATORY_TRACT | 3 refills | Status: DC | PRN
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
## 2018-08-26 NOTE — Patient Instructions (Signed)
Medication Instructions:  No changes If you need a refill on your cardiac medications before your next appointment, please call your pharmacy.   Lab work: None ordered  Testing/Procedures: Your physician has requested that you have an echocardiogram. Echocardiography is a painless test that uses sound waves to create images of your heart. It provides your doctor with information about the size and shape of your heart and how well your heart's chambers and valves are working. You may receive an ultrasound enhancing agent through an IV if needed to better visualize your heart during the echo.This procedure takes approximately one hour. There are no restrictions for this procedure. This will take place at the [facility] clinic.    Follow-Up: At [facility], you and your health needs are our priority.  As part of our continuing mission to provide you with exceptional heart care, we have created designated Provider Care Teams.  These Care Teams include your primary Cardiologist (physician) and Advanced Practice Providers (APPs -  Physician Assistants and Nurse Practitioners) who all work together to provide you with the care you need, when you need it. You will need a follow up appointment in 6 months.  Please call our office 2 months in advance to schedule this appointment.  You may see Dr. Fletcher Anon or one of the following Advanced Practice Providers on your designated Care Team:   Murray Hodgkins, NP Christell Faith, PA-C . Marrianne Mood, PA-C

## 2018-08-27 ENCOUNTER — Ambulatory Visit (INDEPENDENT_AMBULATORY_CARE_PROVIDER_SITE_OTHER): Payer: Medicare Other | Admitting: Psychology

## 2018-08-27 DIAGNOSIS — F4323 Adjustment disorder with mixed anxiety and depressed mood: Secondary | ICD-10-CM | POA: Diagnosis not present

## 2018-08-27 DIAGNOSIS — F4312 Post-traumatic stress disorder, chronic: Secondary | ICD-10-CM

## 2018-08-29 DIAGNOSIS — M0579 Rheumatoid arthritis with rheumatoid factor of multiple sites without organ or systems involvement: Secondary | ICD-10-CM | POA: Diagnosis not present

## 2018-09-03 ENCOUNTER — Ambulatory Visit: Payer: Medicare Other | Admitting: Psychology

## 2018-09-10 ENCOUNTER — Ambulatory Visit (INDEPENDENT_AMBULATORY_CARE_PROVIDER_SITE_OTHER): Payer: Medicare Other | Admitting: Psychology

## 2018-09-10 DIAGNOSIS — F4312 Post-traumatic stress disorder, chronic: Secondary | ICD-10-CM | POA: Diagnosis not present

## 2018-09-10 DIAGNOSIS — F4323 Adjustment disorder with mixed anxiety and depressed mood: Secondary | ICD-10-CM

## 2018-09-17 ENCOUNTER — Ambulatory Visit: Payer: Medicare Other | Admitting: Psychology

## 2018-09-17 DIAGNOSIS — M150 Primary generalized (osteo)arthritis: Secondary | ICD-10-CM | POA: Diagnosis not present

## 2018-09-17 DIAGNOSIS — L409 Psoriasis, unspecified: Secondary | ICD-10-CM | POA: Diagnosis not present

## 2018-09-17 DIAGNOSIS — L4050 Arthropathic psoriasis, unspecified: Secondary | ICD-10-CM | POA: Diagnosis not present

## 2018-09-17 DIAGNOSIS — Z6841 Body Mass Index (BMI) 40.0 and over, adult: Secondary | ICD-10-CM | POA: Diagnosis not present

## 2018-09-17 DIAGNOSIS — M0579 Rheumatoid arthritis with rheumatoid factor of multiple sites without organ or systems involvement: Secondary | ICD-10-CM | POA: Diagnosis not present

## 2018-09-17 DIAGNOSIS — Z79899 Other long term (current) drug therapy: Secondary | ICD-10-CM | POA: Diagnosis not present

## 2018-09-17 DIAGNOSIS — M2550 Pain in unspecified joint: Secondary | ICD-10-CM | POA: Diagnosis not present

## 2018-09-24 ENCOUNTER — Other Ambulatory Visit: Payer: Self-pay

## 2018-10-01 ENCOUNTER — Ambulatory Visit: Payer: Medicare Other | Admitting: Psychology

## 2018-10-07 ENCOUNTER — Other Ambulatory Visit: Payer: Self-pay

## 2018-10-08 ENCOUNTER — Ambulatory Visit (INDEPENDENT_AMBULATORY_CARE_PROVIDER_SITE_OTHER): Payer: Medicare Other | Admitting: Psychology

## 2018-10-08 DIAGNOSIS — F4312 Post-traumatic stress disorder, chronic: Secondary | ICD-10-CM

## 2018-10-08 DIAGNOSIS — F4323 Adjustment disorder with mixed anxiety and depressed mood: Secondary | ICD-10-CM

## 2018-10-15 ENCOUNTER — Ambulatory Visit: Payer: Medicare Other | Admitting: Psychology

## 2018-10-20 DIAGNOSIS — E785 Hyperlipidemia, unspecified: Secondary | ICD-10-CM | POA: Diagnosis not present

## 2018-10-20 DIAGNOSIS — E875 Hyperkalemia: Secondary | ICD-10-CM | POA: Diagnosis not present

## 2018-10-20 DIAGNOSIS — R809 Proteinuria, unspecified: Secondary | ICD-10-CM | POA: Diagnosis not present

## 2018-10-20 DIAGNOSIS — I10 Essential (primary) hypertension: Secondary | ICD-10-CM | POA: Diagnosis not present

## 2018-10-20 DIAGNOSIS — D631 Anemia in chronic kidney disease: Secondary | ICD-10-CM | POA: Diagnosis not present

## 2018-10-20 DIAGNOSIS — N184 Chronic kidney disease, stage 4 (severe): Secondary | ICD-10-CM | POA: Diagnosis not present

## 2018-10-20 DIAGNOSIS — B182 Chronic viral hepatitis C: Secondary | ICD-10-CM | POA: Diagnosis not present

## 2018-10-20 DIAGNOSIS — E1122 Type 2 diabetes mellitus with diabetic chronic kidney disease: Secondary | ICD-10-CM | POA: Diagnosis not present

## 2018-10-21 DIAGNOSIS — M0579 Rheumatoid arthritis with rheumatoid factor of multiple sites without organ or systems involvement: Secondary | ICD-10-CM | POA: Diagnosis not present

## 2018-10-21 LAB — BASIC METABOLIC PANEL: Creatinine: 3.1 — AB (ref 0.6–1.3)

## 2018-10-21 LAB — CBC AND DIFFERENTIAL
Hemoglobin: 11.2 — AB (ref 13.5–17.5)
Platelets: 145 — AB (ref 150–399)

## 2018-10-22 ENCOUNTER — Ambulatory Visit (INDEPENDENT_AMBULATORY_CARE_PROVIDER_SITE_OTHER): Payer: Medicare Other | Admitting: Psychology

## 2018-10-22 DIAGNOSIS — F4323 Adjustment disorder with mixed anxiety and depressed mood: Secondary | ICD-10-CM

## 2018-10-22 DIAGNOSIS — F4312 Post-traumatic stress disorder, chronic: Secondary | ICD-10-CM

## 2018-10-24 ENCOUNTER — Ambulatory Visit: Payer: Self-pay | Admitting: Internal Medicine

## 2018-10-24 ENCOUNTER — Ambulatory Visit (INDEPENDENT_AMBULATORY_CARE_PROVIDER_SITE_OTHER): Payer: Medicare Other | Admitting: Internal Medicine

## 2018-10-24 ENCOUNTER — Other Ambulatory Visit: Payer: Self-pay

## 2018-10-24 ENCOUNTER — Encounter: Payer: Self-pay | Admitting: Internal Medicine

## 2018-10-24 VITALS — BP 132/72 | Wt 237.0 lb

## 2018-10-24 DIAGNOSIS — J209 Acute bronchitis, unspecified: Secondary | ICD-10-CM

## 2018-10-24 DIAGNOSIS — F331 Major depressive disorder, recurrent, moderate: Secondary | ICD-10-CM

## 2018-10-24 DIAGNOSIS — J440 Chronic obstructive pulmonary disease with acute lower respiratory infection: Secondary | ICD-10-CM
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
## 2018-10-24 NOTE — Assessment & Plan Note (Signed)
No exacerbation---though has some respiratory symptoms related to pollen allergies now

## 2018-10-24 NOTE — Assessment & Plan Note (Signed)
He seems to be doing fine---despite the new stress with his wife's pancreatic cancer. No medication changes are needed Counseled about spending time outside, walking, etc---as long as not near other people

## 2018-10-29 ENCOUNTER — Ambulatory Visit: Payer: Medicare Other | Admitting: Psychology

## 2018-11-05 ENCOUNTER — Ambulatory Visit (INDEPENDENT_AMBULATORY_CARE_PROVIDER_SITE_OTHER): Payer: Medicare Other | Admitting: Psychology

## 2018-11-05 DIAGNOSIS — F4312 Post-traumatic stress disorder, chronic: Secondary | ICD-10-CM

## 2018-11-05 DIAGNOSIS — F4323 Adjustment disorder with mixed anxiety and depressed mood: Secondary | ICD-10-CM

## 2018-11-12 ENCOUNTER — Ambulatory Visit (INDEPENDENT_AMBULATORY_CARE_PROVIDER_SITE_OTHER): Payer: Medicare Other | Admitting: Psychology

## 2018-11-12 DIAGNOSIS — F4323 Adjustment disorder with mixed anxiety and depressed mood: Secondary | ICD-10-CM | POA: Diagnosis not present

## 2018-11-12 DIAGNOSIS — F4312 Post-traumatic stress disorder, chronic: Secondary | ICD-10-CM | POA: Diagnosis not present

## 2018-11-19 ENCOUNTER — Ambulatory Visit (INDEPENDENT_AMBULATORY_CARE_PROVIDER_SITE_OTHER): Payer: Medicare Other | Admitting: Psychology

## 2018-11-19 DIAGNOSIS — F4312 Post-traumatic stress disorder, chronic: Secondary | ICD-10-CM

## 2018-11-19 DIAGNOSIS — F4323 Adjustment disorder with mixed anxiety and depressed mood: Secondary | ICD-10-CM | POA: Diagnosis not present

## 2018-11-26 ENCOUNTER — Ambulatory Visit (INDEPENDENT_AMBULATORY_CARE_PROVIDER_SITE_OTHER): Payer: Medicare Other | Admitting: Psychology

## 2018-11-26 DIAGNOSIS — F4323 Adjustment disorder with mixed anxiety and depressed mood: Secondary | ICD-10-CM

## 2018-11-26 DIAGNOSIS — F4312 Post-traumatic stress disorder, chronic: Secondary | ICD-10-CM | POA: Diagnosis not present

## 2018-12-01 DIAGNOSIS — L851 Acquired keratosis [keratoderma] palmaris et plantaris: Secondary | ICD-10-CM | POA: Diagnosis not present

## 2018-12-01 DIAGNOSIS — E1142 Type 2 diabetes mellitus with diabetic polyneuropathy: Secondary | ICD-10-CM | POA: Diagnosis not present

## 2018-12-01 DIAGNOSIS — B351 Tinea unguium: Secondary | ICD-10-CM | POA: Diagnosis not present

## 2018-12-02 DIAGNOSIS — M0579 Rheumatoid arthritis with rheumatoid factor of multiple sites without organ or systems involvement: Secondary | ICD-10-CM | POA: Diagnosis not present

## 2018-12-03 ENCOUNTER — Ambulatory Visit: Payer: Medicare Other | Admitting: Psychology

## 2018-12-10 ENCOUNTER — Ambulatory Visit: Payer: Medicare Other | Admitting: Psychology

## 2018-12-12 ENCOUNTER — Ambulatory Visit: Payer: Self-pay | Admitting: Internal Medicine

## 2018-12-17 ENCOUNTER — Ambulatory Visit: Payer: Medicare Other | Admitting: Psychology

## 2018-12-18 ENCOUNTER — Other Ambulatory Visit: Payer: Self-pay | Admitting: Cardiovascular Disease

## 2018-12-19 DIAGNOSIS — R4182 Altered mental status, unspecified: Secondary | ICD-10-CM | POA: Diagnosis not present

## 2018-12-19 DIAGNOSIS — Z5329 Procedure and treatment not carried out because of patient's decision for other reasons: Secondary | ICD-10-CM | POA: Diagnosis not present

## 2018-12-23 ENCOUNTER — Other Ambulatory Visit: Payer: Self-pay | Admitting: Cardiovascular Disease

## 2018-12-23 ENCOUNTER — Other Ambulatory Visit: Payer: Self-pay | Admitting: Internal Medicine

## 2018-12-23 MED ORDER — BUPROPION HCL ER (XL) 300 MG PO TB24: 300.0000 mg | ORAL_TABLET | Freq: Every day | ORAL | 3 refills | Status: DC

## 2018-12-23 MED ORDER — SERTRALINE HCL 100 MG PO TABS: 200.0000 mg | ORAL_TABLET | Freq: Every day | ORAL | 3 refills | Status: DC

## 2018-12-23 NOTE — Treatment Plan (Signed)
Spoke to pt. He said he did not need anything else.

## 2018-12-23 NOTE — Progress Notes (Signed)
Please let him I know I refilled both of his medications for depression. Make sure that is all he needed

## 2018-12-23 NOTE — Telephone Encounter (Signed)
Pt made aware to contact his PCP for additional refills. He is aware that we only refill cardiac medications.

## 2018-12-23 NOTE — Telephone Encounter (Signed)
Patient is requesting refill on Depression medication. Patient did not know the name of the medications but is  Completely out and stated he needed this.    [place]   Patient [phone]

## 2018-12-24 ENCOUNTER — Other Ambulatory Visit: Payer: Self-pay

## 2018-12-24 ENCOUNTER — Ambulatory Visit: Payer: Medicare Other | Admitting: Psychology

## 2018-12-26 ENCOUNTER — Encounter: Payer: Self-pay | Admitting: Internal Medicine

## 2018-12-26 ENCOUNTER — Ambulatory Visit (INDEPENDENT_AMBULATORY_CARE_PROVIDER_SITE_OTHER): Payer: Medicare Other | Admitting: Internal Medicine

## 2018-12-26 DIAGNOSIS — Z794 Long term (current) use of insulin: Secondary | ICD-10-CM | POA: Diagnosis not present

## 2018-12-26 DIAGNOSIS — E041 Nontoxic single thyroid nodule: Secondary | ICD-10-CM

## 2018-12-26 DIAGNOSIS — E782 Mixed hyperlipidemia: Secondary | ICD-10-CM | POA: Diagnosis not present

## 2018-12-26 DIAGNOSIS — E1151 Type 2 diabetes mellitus with diabetic peripheral angiopathy without gangrene: Secondary | ICD-10-CM

## 2018-12-26 MED ORDER — INSULIN NPH (HUMAN) (ISOPHANE) 100 UNIT/ML ~~LOC~~ SUSP: 20.0000 [IU] | Freq: Every day | SUBCUTANEOUS | 11 refills | Status: DC

## 2018-12-26 NOTE — Patient Instructions (Addendum)
Please restart: - NPH 8-10 units at bedtime  Please come back for a follow-up appointment in 4-6 months.

## 2018-12-26 NOTE — Progress Notes (Signed)
Patient ID: [name] [name], male   DOB: April 30, 1956, 63 y.o.   MRN: [id number]  Patient location: Home My location: Office  Referring Provider: [name], MD  I connected with the patient on 12/26/18 at  8:27 AM EDT by telephone and verified that I am speaking with the correct person.   I discussed the limitations of evaluation and management by telephone and the availability of in person appointments. The patient expressed understanding and agreed to proceed.   Details of the encounter are shown below.  HPI: [name] [name] is a 63 y.o.-year-old male, presenting for f/u for DM2, dx 2006, insulin-dependent since 2011, uncontrolled, with complications (CAD - CABG 2012, PVD - s/p Stents, Aortic and carotid stenosis, peripheral neuropathy). Last visit 6.5 months ago.  He continues to eat small portions.  He lost 10 pounds before last visit and 5 more before this visit.  Last hemoglobin A1c: Lab Results  Component Value Date   HGBA1C 5.8 (A) 06/13/2018   HGBA1C 5.9 (A) 12/10/2017   HGBA1C 5.9 08/12/2017   He was on: Insulin Before breakfast Before lunch Before dinner  Regular - clear (short acting) 20 units - smaller meal 25 units - regular meal 30 units - larger meal  10 units - smaller meal 15 units - regular meal 20 units - larger meal  NPH - cloudy (long acting) 40  20  We stopped Victoza.  Previously on: - NPH 10 units before breakfast - R insulin 5 to 10 units of regular insulin before a larger meal.  He is currently not on any insulin.   Pt checks his sugars 1-3x a day: - am: 58, 63, 84-152 >> 78-142, 158 >> 89-152 >> 142-172 - 2h after b'fast: 148-178 >> n/c  - before lunch: 84-166, 182, 202 >> 105-115 - 2h after lunch:105-169 >> 100-200 >> n/c >> 190 - before dinner:126-209, 236 >> n/c - 2h after dinner: 143-201 >> 114-187, 210, 225 >> n/c - bedtime: n/c >> 130, 169 >> n/c Lowest sugar was 48 >> 83 >> 105; it is unclear at which CBG level he has  hypoglycemia awareness. Highest sugar was 225 >> 190.  Pt's meals are: - Breakfast: toast + eggs or cereals + milk - Lunch: soup + 1/2 sandwich - Dinner: meat + sweet potatoes or pasta + salad - Snacks: 3: sugar free foods, fruit He quit drinking sodas, + tea- artificial sweetener.  He drinks almond milk and water.  -+ CKD stage IV, last BUN/creatinine:  Lab Results  Component Value Date   BUN 46 (H) 03/25/2018   CREATININE 2.87 (H) 03/25/2018  On  Cozaar. Sees nephrology (Dr. [name]). Has a stone in L kidney, has 3 cysts in R kidney. -+ HL; last set of lipids: Lab Results  Component Value Date   CHOL 137 12/13/2017   HDL 24 (L) 12/13/2017   LDLCALC 59 12/13/2017   LDLDIRECT 61.0 03/15/2017   TRIG 271 (H) 12/13/2017   CHOLHDL 5.7 12/13/2017  On Zocor, Vascepa. - last eye exam was in 03/2018: No DR, he has a history of cataract surgery. [facility]. - + numbness and tingling in his feet - [facility]). Dr. Elvina Mattes.  He had surgery for PVD stents on 04/27/2014.  Sugars improved after the stent.  He also has a history of a thyroid nodule: 0.9 x 0.7 x 1.3 cm: Solid, hypoechoic He had FNA of this nodule in 05/2017: Benign He has occasional dysphagia but this is related to  his esophageal stricture, previously dilated.  He sees Dr. Hilarie Fredrickson.    He continues to see his neurology for forgetfulness (Dr. Carles Collet).    He will see Dr. Fletcher Anon soon >> will have a stress test; may need a heart stent.  ROS: Constitutional: no weight gain/+ weight loss, no fatigue, no subjective hyperthermia, no subjective hypothermia Eyes: no blurry vision, no xerophthalmia ENT: no sore throat, no nodules palpated in neck, no dysphagia, no odynophagia, no hoarseness Cardiovascular: no CP/+ improved SOB/no palpitations/no leg swelling Respiratory: no cough/+ improved SOB/no wheezing Gastrointestinal: no N/no V/no D/no C/no acid reflux Musculoskeletal: no muscle aches/no joint aches Skin: no  rashes, no hair loss Neurological: no tremors/+ numbness/+ tingling/no dizziness  I reviewed pt's medications, allergies, PMH, social hx, family hx, and changes were documented in the history of present illness. Otherwise, unchanged from my initial visit note.  Past Medical History:  Diagnosis Date  . Adenomatous colon polyp   . Allergy   . Angina at [facility])    chronic  . Anxiety   . Arthritis    RA  . Bell's palsy 2007  . Carotid artery occlusion   . Cataract    Dr. Dawna Part  . Coronary artery disease   . Depression   . Diabetes mellitus   . Diverticulosis   . Duodenitis   . DVT (deep venous thrombosis) (HCC)    in leg  . Dyspnea    DOE  . Gastropathy   . GERD (gastroesophageal reflux disease)   . Heart murmur   . Helicobacter pylori gastritis   . Hyperlipidemia   . Hypertension   . Kidney failure   . Mild aortic stenosis   . Neuropathy   . Obesity   . Peripheral vascular disease ([place])   . Post splenectomy syndrome   . Psoriasis   . Psoriatic arthritis ([place])   . Renal disorder    stage 4 kidney failure  . Sleep apnea    uses cpap  . SOB (shortness of breath)   . Stroke ([place])   . Tobacco use disorder    recently quit  . Tubular adenoma 08/2013   Dr. Hilarie Fredrickson  . Weak urinary stream    Past Surgical History:  Procedure Laterality Date  . CARDIAC CATHETERIZATION  08/2010   LAD: 80% ISR, RCA: 80% ostial, OM 80-90%  . CAROTID ENDARTERECTOMY  08/2010   left/ Dr. Kellie Simmering  . CATARACT EXTRACTION W/PHACO Left 07/09/2017   Procedure: CATARACT EXTRACTION PHACO AND INTRAOCULAR LENS PLACEMENT (IOC);  Surgeon: Birder Robson, MD;  Location: [facility] ORS;  Service: Ophthalmology;  Laterality: Left;  [place] 00:23 AP% 14.2 CDE 3.26 Fluid pack lot # [id number]  . CATARACT EXTRACTION W/PHACO Right 09/10/2017   Procedure: CATARACT EXTRACTION PHACO AND INTRAOCULAR LENS PLACEMENT (IOC);  Surgeon: Birder Robson, MD;  Location: [facility] ORS;  Service: Ophthalmology;  Laterality: Right;   [place] 00:26.4 AP% 17.3 CDE 4.59 Fluid Pack Lot # [id number]  . COLONOSCOPY    . CORONARY ANGIOPLASTY     LAD: before CABG  . CORONARY ARTERY BYPASS GRAFT  09/06/2010   At [facility]: LIMA to LAD, left radial to RCA, sequential SVG to OM3 and 4  . heart stents  Jan 2011   leg stents 06/2009 and 03/2010  . PTA of illiac and SFA  multiple   Dr. Ronalee Belts, s/p revision 11/2012  . SPLENECTOMY    . VASCULAR SURGERY     LEG STENTS   Social History   Socioeconomic History  .  Marital status: Married    Spouse name: Not on file  . Number of children: 0  . Years of education: Not on file  . Highest education level: Not on file  Occupational History  . Occupation: Chief Operating Officer at [employer]: Disabled mostly due to neuropathy  Social Needs  . Financial resource strain: Not on file  . Food insecurity:    Worry: Not on file    Inability: Not on file  . Transportation needs:    Medical: Not on file    Non-medical: Not on file  Tobacco Use  . Smoking status: Former Smoker    Packs/day: 0.75    Years: 40.00    Pack years: 30.00    Types: Cigarettes    Last attempt to quit: 04/15/2017    Years since quitting: 1.6  . Smokeless tobacco: Never Used  . Tobacco comment: 1 cigarette a day  Substance and Sexual Activity  . Alcohol use: No  . Drug use: No  . Sexual activity: Never  Lifestyle  . Physical activity:    Days per week: Not on file    Minutes per session: Not on file  . Stress: Not on file  Relationships  . Social connections:    Talks on phone: Not on file    Gets together: Not on file    Attends religious service: Not on file    Active member of club or organization: Not on file    Attends meetings of clubs or organizations: Not on file    Relationship status: Not on file  . Intimate partner violence:    Fear of current or ex partner: Not on file    Emotionally abused: Not on file    Physically abused: Not on file    Forced sexual activity: Not on file  Other Topics  Concern  . Not on file  Social History Narrative   Has living will   Wife is health care POA---brother Ronalee Belts is alternate   Would accept resuscitation --but no prolonged ventilation   No tube feeds if cognitively unaware   Current Outpatient Medications on File Prior to Visit  Medication Sig Dispense Refill  . ACCU-CHEK FASTCLIX LANCETS MISC Use to check sugar 1 time daily. 100 each 5  . albuterol (PROAIR HFA) 108 (90 Base) MCG/ACT inhaler Inhale 2 puffs into the lungs every 6 (six) hours as needed for shortness of breath. 1 Inhaler 3  . aspirin 81 MG tablet Take 81 mg by mouth daily.    [name] buPROPion (WELLBUTRIN XL) 300 MG 24 hr tablet Take 1 tablet (300 mg total) by mouth daily. 90 tablet 3  . busPIRone (BUSPAR) 10 MG tablet Take 2 tablets (20 mg total) by mouth 2 (two) times daily. 360 tablet 3  . calcitRIOL (ROCALTROL) 0.25 MCG capsule Take 0.25 mcg by mouth 3 (three) times a week.    . carvedilol (COREG) 12.5 MG tablet TAKE 1 TABLET BY MOUTH TWICE DAILY 180 tablet 3  . Cholecalciferol (VITAMIN D PO) Take 1 capsule by mouth daily.     . Cinnamon 500 MG capsule Take by mouth.    . clopidogrel (PLAVIX) 75 MG tablet TAKE 1 TABLET BY MOUTH EVERY DAY 90 tablet 0  . cyclobenzaprine (FLEXERIL) 5 MG tablet Take 1-2 tablets (5-10 mg total) by mouth at bedtime as needed for muscle spasms. 30 tablet 0  . famotidine (PEPCID) 40 MG tablet Take 1 tablet (40 mg total) by mouth 2 (two) times  daily. 180 tablet 3  . finasteride (PROSCAR) 5 MG tablet Take 5 mg by mouth daily.    . furosemide (LASIX) 20 MG tablet Take 20 mg by mouth daily.    . Glucosamine-Chondroit-Vit C-Mn (GLUCOSAMINE CHONDROITIN COMPLX) CAPS Take by mouth.    [name] glucose blood test strip Use as instructed to test sugars one time daily DX E11.51 100 each 3  . Icosapent Ethyl (VASCEPA) 1 g CAPS Take 2 capsules (2 g total) by mouth daily. 120 capsule 5  . insulin NPH Human (NOVOLIN N RELION) 100 UNIT/ML injection Inject 40 units in am and 20  units in the evening (Patient taking differently: Inject 20 Units into the skin daily as needed (High Blood Sugar). ) 40 mL 2  . insulin regular (NOVOLIN R,HUMULIN R) 100 units/mL injection Inject underskin 10-30 units regular insulin three times a day before meals. 40 mL 5  . ketoconazole (NIZORAL) 2 % cream Apply topically 2 (two) times daily. For rash 15 g 0  . losartan (COZAAR) 100 MG tablet TAKE 1 TABLET(100 MG) BY MOUTH DAILY 30 tablet 2  . MAGNESIUM PO Take 400 mg by mouth daily.     . Multiple Vitamin (MULTIVITAMIN) capsule Take by mouth.    . nitroGLYCERIN (NITROSTAT) 0.4 MG SL tablet Place 1 tablet (0.4 mg total) under the tongue every 5 (five) minutes as needed. 25 tablet 1  . RELION INSULIN SYRINGE 31G X 15/64" 1 ML MISC ONE INJECTION THREE TIMES DAILY 200 each 2  . sertraline (ZOLOFT) 100 MG tablet Take 2 tablets (200 mg total) by mouth daily. 180 tablet 3  . simvastatin (ZOCOR) 20 MG tablet TAKE 1 TABLET BY MOUTH ONCE DAILY AT 6PM 90 tablet 3  . tamsulosin (FLOMAX) 0.4 MG CAPS capsule take 2 capsule by mouth daily (Patient taking differently: Take 0.4 mg by mouth 2 (two) times daily. ) 60 capsule 11   No current facility-administered medications on file prior to visit.    Allergies  Allergen Reactions  . Contrast Media [Iodinated Diagnostic Agents] Rash and Other (See Comments)    Got very hot and red   . Glipizide Other (See Comments)    ANTIDIABETICS. Burning  . Metrizamide Other (See Comments)    Got very hot and red  . Penicillins Hives and Swelling    Has patient had a PCN reaction causing immediate rash, facial/tongue/throat swelling, SOB or lightheadedness with hypotension: yes Has patient had a PCN reaction causing severe rash involving mucus membranes or skin necrosis: no  Has patient had a PCN reaction that required hospitalization: yes Has patient had a PCN reaction occurring within the last 10 years: no If all of the above answers are "NO", then may proceed with  Cephalosporin use.    Family History  Problem Relation Age of Onset  . Lung cancer Father 86  . Arthritis Father   . Breast cancer Sister 55  . Alcoholism Sister   . Dementia Mother   . Alzheimer's disease Mother   . Arthritis Mother   . Alcoholism Brother   . Epilepsy Sister   . Diabetes Paternal Grandfather   . Colon polyps Paternal Grandfather 79  . Alcoholism Sister   . Alcoholism Sister   . Alcoholism Brother    PE: There were no vitals taken for this visit. There is no height or weight on file to calculate BMI. Wt Readings from Last 3 Encounters:  10/24/18 237 lb (107.5 kg)  08/26/18 247 lb (112 kg)  07/25/18  247 lb (112 kg)   Constitutional:  in NAD  The physical exam was not performed (telephone visit).  ASSESSMENT: 1. DM2, insulin-dependent, now controlled, with complications - CAD - s/p CABG 2012 - PVD - s/p stents 2012 - Aortic stenosis - carotid stenosis - CKD - peripheral neuropathy  - He read Dr Janene Harvey book: Program for Reversing Diabetes".  2. R Thyroid nodule  3. HL  PLAN:  1. Patient with longstanding, previously uncontrolled type 2 diabetes, now mostly diet-controlled with only rare requirement for intermediate or fast acting insulin.  He was taking approximately 10 units in each on a prn basis.  At last visit, sugars were a little worse and we discussed about starting taking NPH 10 units in the morning consistently.  I also advised him to use 5 to 10 units of regular insulin for larger meals. -Of note, his HbA1c at last visit percent, which is excellent, but he does have CKD which can falsely lower his HbA1c.  However, he is doing a good job checking his sugars at home and writing them down and these are consistent with the HbA1c. -He lost approximately 5 pounds since last visit -He stopped insulin completely approximately a month ago.  However, sugars in the morning are above goal.  Later in the day, he had one high blood sugar checked  after lunch, and several blood sugars that are at goal before this meal.  We discussed about starting a low dose of NPH at bedtime to improve the morning sugars.  I do not feel that he needs this twice a day or that he needs regular insulin for now.  We are limited in the medication choices by his decreased GFR. - I advised him to Patient Instructions  Please restart: - NPH 8-10 units at bedtime  Please come back for a follow-up appointment in 4-6 months.  - we will check his HbA1c when he returns to the clinic - continue checking sugars at different times of the day - check 1-2x a day, rotating checks - advised for yearly eye exams >> he is UTD - Return to clinic in 4-6 mo with sugar log    2. R Thyroid nodule -This is not very large, but appears hypoechoic, solid -Recent biopsy results: Benign -No neck compression symptoms other than dysphagia, most likely related to esophageal stricture.  He has esophageal dilations. - No further investigation for now  3. HL - Reviewed lates neededt lipid panel from a year ago: LDL at goal, triglycerides high, HDL low Lab Results  Component Value Date   CHOL 137 12/13/2017   HDL 24 (L) 12/13/2017   LDLCALC 59 12/13/2017   LDLDIRECT 61.0 03/15/2017   TRIG 271 (H) 12/13/2017   CHOLHDL 5.7 12/13/2017  - Continues Zocor and Vascepa without side effects.  - time spent with the patient: 13 min, of which >50% was spent in obtaining information about his symptoms, reviewing his previous labs, evaluations, and treatments, counseling him about his conditions (please see the discussed topics above), and developing a plan to further investigate and treat them; he had a number of questions which I addressed.   Philemon Kingdom, MD PhD [facility] Endocr[facility]

## 2018-12-31 ENCOUNTER — Ambulatory Visit: Payer: Medicare Other | Admitting: Psychology

## 2019-01-07 ENCOUNTER — Ambulatory Visit: Payer: Medicare Other | Admitting: Psychology

## 2019-01-08 ENCOUNTER — Ambulatory Visit (INDEPENDENT_AMBULATORY_CARE_PROVIDER_SITE_OTHER): Payer: Medicare Other

## 2019-01-08 ENCOUNTER — Other Ambulatory Visit: Payer: Self-pay

## 2019-01-08 DIAGNOSIS — I350 Nonrheumatic aortic (valve) stenosis: Secondary | ICD-10-CM | POA: Diagnosis not present

## 2019-01-08 MED ORDER — PERFLUTREN LIPID MICROSPHERE
1.0000 mL | INTRAVENOUS | Status: AC | PRN
  Administered 2019-01-08: 2 mL via INTRAVENOUS

## 2019-01-14 ENCOUNTER — Telehealth: Payer: Self-pay

## 2019-01-14 ENCOUNTER — Ambulatory Visit: Payer: Medicare Other | Admitting: Psychology

## 2019-01-14 DIAGNOSIS — M0579 Rheumatoid arthritis with rheumatoid factor of multiple sites without organ or systems involvement: Secondary | ICD-10-CM | POA: Diagnosis not present

## 2019-01-14 DIAGNOSIS — I359 Nonrheumatic aortic valve disorder, unspecified: Secondary | ICD-10-CM

## 2019-01-14 NOTE — Telephone Encounter (Signed)
Echo results not yet reviewed by the pt in mychart. Called pt and lmtcb for results.

## 2019-01-14 NOTE — Telephone Encounter (Signed)
-----   Message from Wellington Hampshire, MD sent at 01/09/2019  5:54 PM EDT ----- Inform patient that echo was fine.  Normal ejection fraction with stable moderate aortic stenosis.  Repeat echo in 1 year.

## 2019-01-15 NOTE — Telephone Encounter (Signed)
-----   Message from Wellington Hampshire, MD sent at 01/09/2019  5:54 PM EDT ----- Inform patient that echo was fine.  Normal ejection fraction with stable moderate aortic stenosis.  Repeat echo in 1 year.

## 2019-01-15 NOTE — Telephone Encounter (Signed)
Pt made aware of echo results with verbal understanding. Order placed in Epic for 1 year repeat echo.

## 2019-01-20 ENCOUNTER — Other Ambulatory Visit: Payer: Self-pay | Admitting: Internal Medicine

## 2019-01-21 ENCOUNTER — Ambulatory Visit (INDEPENDENT_AMBULATORY_CARE_PROVIDER_SITE_OTHER): Payer: Medicare Other | Admitting: Psychology

## 2019-01-21 DIAGNOSIS — F423 Hoarding disorder: Secondary | ICD-10-CM

## 2019-01-21 DIAGNOSIS — F4312 Post-traumatic stress disorder, chronic: Secondary | ICD-10-CM

## 2019-01-28 ENCOUNTER — Ambulatory Visit (INDEPENDENT_AMBULATORY_CARE_PROVIDER_SITE_OTHER): Payer: Medicare Other | Admitting: Psychology

## 2019-01-28 DIAGNOSIS — F4312 Post-traumatic stress disorder, chronic: Secondary | ICD-10-CM | POA: Diagnosis not present

## 2019-01-28 DIAGNOSIS — F4323 Adjustment disorder with mixed anxiety and depressed mood: Secondary | ICD-10-CM | POA: Diagnosis not present

## 2019-01-29 ENCOUNTER — Encounter: Payer: Self-pay | Admitting: Internal Medicine

## 2019-02-04 ENCOUNTER — Ambulatory Visit (INDEPENDENT_AMBULATORY_CARE_PROVIDER_SITE_OTHER): Payer: Medicare Other | Admitting: Psychology

## 2019-02-04 DIAGNOSIS — F4312 Post-traumatic stress disorder, chronic: Secondary | ICD-10-CM | POA: Diagnosis not present

## 2019-02-04 DIAGNOSIS — F4323 Adjustment disorder with mixed anxiety and depressed mood: Secondary | ICD-10-CM

## 2019-02-11 ENCOUNTER — Encounter: Payer: Self-pay | Admitting: Internal Medicine

## 2019-02-11 ENCOUNTER — Ambulatory Visit (INDEPENDENT_AMBULATORY_CARE_PROVIDER_SITE_OTHER): Payer: Medicare Other | Admitting: Internal Medicine

## 2019-02-11 ENCOUNTER — Ambulatory Visit: Payer: Medicare Other | Admitting: Psychology

## 2019-02-11 ENCOUNTER — Other Ambulatory Visit: Payer: Self-pay

## 2019-02-11 VITALS — BP 130/70 | HR 78 | Temp 97.0°F | Ht 65.5 in | Wt 241.0 lb

## 2019-02-11 DIAGNOSIS — Z794 Long term (current) use of insulin: Secondary | ICD-10-CM

## 2019-02-11 DIAGNOSIS — M79602 Pain in left arm: Secondary | ICD-10-CM

## 2019-02-11 DIAGNOSIS — I25118 Atherosclerotic heart disease of native coronary artery with other forms of angina pectoris: Secondary | ICD-10-CM

## 2019-02-11 DIAGNOSIS — F331 Major depressive disorder, recurrent, moderate: Secondary | ICD-10-CM

## 2019-02-11 DIAGNOSIS — E1151 Type 2 diabetes mellitus with diabetic peripheral angiopathy without gangrene: Secondary | ICD-10-CM

## 2019-02-11 LAB — POCT GLYCOSYLATED HEMOGLOBIN (HGB A1C): Hemoglobin A1C: 6.1 % — AB (ref 4.0–5.6)

## 2019-02-11 NOTE — Patient Instructions (Signed)
If your arm is not getting any better over the next few weeks, please set up an appointment with my sports medicine specialist partner--Dr Copland

## 2019-02-11 NOTE — Progress Notes (Signed)
Subjective:    Patient ID: [name] [name], male    DOB: Aug 23, 1955, 63 y.o.   MRN: [id number]  HPI Here due to left arm pain Throbbing along arm--down to ulnar wrist---- started 2 months ago but has worsened. Some swelling there Fairly constant No injury Tried over the counter topical Rx--ice/heat. No help Doesn't have chest pain or sense this is from his heart  Wife died June 12---was found to have pancreatic cancer Hospice was invovled Has been staying at home with COVID Daughter in law does shopping for him His appetite is off Staying "down in the dumps" Having issues with her estate--more stress Has given up driving  Checks sugars daily Range is 77 to over 200 Usually mid 100's No hypoglycemic reactions Hadn't been using insulin lately--working on eating better  Current Outpatient Medications on File Prior to Visit  Medication Sig Dispense Refill  . ACCU-CHEK FASTCLIX LANCETS MISC Use to check sugar 1 time daily. 100 each 5  . albuterol (PROAIR HFA) 108 (90 Base) MCG/ACT inhaler Inhale 2 puffs into the lungs every 6 (six) hours as needed for shortness of breath. 1 Inhaler 3  . aspirin 81 MG tablet Take 81 mg by mouth daily.    [name] buPROPion (WELLBUTRIN XL) 300 MG 24 hr tablet Take 1 tablet (300 mg total) by mouth daily. 90 tablet 3  . busPIRone (BUSPAR) 10 MG tablet Take 2 tablets (20 mg total) by mouth 2 (two) times daily. 360 tablet 3  . calcitRIOL (ROCALTROL) 0.25 MCG capsule Take 0.25 mcg by mouth 3 (three) times a week.    . carvedilol (COREG) 12.5 MG tablet TAKE 1 TABLET BY MOUTH TWICE DAILY 180 tablet 3  . Cholecalciferol (VITAMIN D PO) Take 1 capsule by mouth daily.     . Cinnamon 500 MG capsule Take by mouth.    . clopidogrel (PLAVIX) 75 MG tablet TAKE 1 TABLET BY MOUTH EVERY DAY 90 tablet 0  . CONTOUR TEST test strip TEST EVERY DAY 100 strip 11  . cyclobenzaprine (FLEXERIL) 5 MG tablet Take 1-2 tablets (5-10 mg total) by mouth at bedtime as needed for muscle  spasms. 30 tablet 0  . famotidine (PEPCID) 40 MG tablet Take 1 tablet (40 mg total) by mouth 2 (two) times daily. 180 tablet 3  . finasteride (PROSCAR) 5 MG tablet Take 5 mg by mouth daily.    . furosemide (LASIX) 20 MG tablet Take 20 mg by mouth daily.    . Glucosamine-Chondroit-Vit C-Mn (GLUCOSAMINE CHONDROITIN COMPLX) CAPS Take by mouth.    Vanessa Kick Ethyl (VASCEPA) 1 g CAPS Take 2 capsules (2 g total) by mouth daily. 120 capsule 5  . insulin NPH Human (NOVOLIN N RELION) 100 UNIT/ML injection Inject 0.2 mLs (20 Units total) into the skin at bedtime. 10 mL 11  . ketoconazole (NIZORAL) 2 % cream Apply topically 2 (two) times daily. For rash 15 g 0  . losartan (COZAAR) 100 MG tablet TAKE 1 TABLET(100 MG) BY MOUTH DAILY 30 tablet 2  . MAGNESIUM PO Take 400 mg by mouth daily.     . Multiple Vitamin (MULTIVITAMIN) capsule Take by mouth.    . nitroGLYCERIN (NITROSTAT) 0.4 MG SL tablet Place 1 tablet (0.4 mg total) under the tongue every 5 (five) minutes as needed. 25 tablet 1  . RELION INSULIN SYRINGE 31G X 15/64" 1 ML MISC ONE INJECTION THREE TIMES DAILY 200 each 2  . sertraline (ZOLOFT) 100 MG tablet Take 2 tablets (200 mg total)  by mouth daily. 180 tablet 3  . simvastatin (ZOCOR) 20 MG tablet TAKE 1 TABLET BY MOUTH ONCE DAILY AT 6PM 90 tablet 3  . tamsulosin (FLOMAX) 0.4 MG CAPS capsule take 2 capsule by mouth daily (Patient taking differently: Take 0.4 mg by mouth 2 (two) times daily. ) 60 capsule 11   No current facility-administered medications on file prior to visit.     Allergies  Allergen Reactions  . Contrast Media [Iodinated Diagnostic Agents] Rash and Other (See Comments)    Got very hot and red   . Glipizide Other (See Comments)    ANTIDIABETICS. Burning  . Metrizamide Other (See Comments)    Got very hot and red  . Penicillins Hives and Swelling    Has patient had a PCN reaction causing immediate rash, facial/tongue/throat swelling, SOB or lightheadedness with hypotension:  yes Has patient had a PCN reaction causing severe rash involving mucus membranes or skin necrosis: no  Has patient had a PCN reaction that required hospitalization: yes Has patient had a PCN reaction occurring within the last 10 years: no If all of the above answers are "NO", then may proceed with Cephalosporin use.     Past Medical History:  Diagnosis Date  . Adenomatous colon polyp   . Allergy   . Angina at rest [facility])    chronic  . Anxiety   . Arthritis    RA  . Bell's palsy 2007  . Carotid artery occlusion   . Cataract    Dr. Dawna Part  . Coronary artery disease   . Depression   . Diabetes mellitus   . Diverticulosis   . Duodenitis   . DVT (deep venous thrombosis) (HCC)    in leg  . Dyspnea    DOE  . Gastropathy   . GERD (gastroesophageal reflux disease)   . Heart murmur   . Helicobacter pylori gastritis   . Hyperlipidemia   . Hypertension   . Kidney failure   . Mild aortic stenosis   . Neuropathy   . Obesity   . Peripheral vascular disease ([place])   . Post splenectomy syndrome   . Psoriasis   . Psoriatic arthritis ([place])   . Renal disorder    stage 4 kidney failure  . Sleep apnea    uses cpap  . SOB (shortness of breath)   . Stroke ([place])   . Tobacco use disorder    recently quit  . Tubular adenoma 08/2013   Dr. Hilarie Fredrickson  . Weak urinary stream     Past Surgical History:  Procedure Laterality Date  . CARDIAC CATHETERIZATION  08/2010   LAD: 80% ISR, RCA: 80% ostial, OM 80-90%  . CAROTID ENDARTERECTOMY  08/2010   left/ Dr. Kellie Simmering  . CATARACT EXTRACTION W/PHACO Left 07/09/2017   Procedure: CATARACT EXTRACTION PHACO AND INTRAOCULAR LENS PLACEMENT (IOC);  Surgeon: Birder Robson, MD;  Location: [facility] ORS;  Service: Ophthalmology;  Laterality: Left;  [place] 00:23 AP% 14.2 CDE 3.26 Fluid pack lot # [id number]  . CATARACT EXTRACTION W/PHACO Right 09/10/2017   Procedure: CATARACT EXTRACTION PHACO AND INTRAOCULAR LENS PLACEMENT (IOC);  Surgeon: Birder Robson, MD;   Location: [facility] ORS;  Service: Ophthalmology;  Laterality: Right;  [place] 00:26.4 AP% 17.3 CDE 4.59 Fluid Pack Lot # [id number]  . COLONOSCOPY    . CORONARY ANGIOPLASTY     LAD: before CABG  . CORONARY ARTERY BYPASS GRAFT  09/06/2010   At [facility]: LIMA to LAD, left radial to RCA, sequential SVG to OM3 and  4  . heart stents  Jan 2011   leg stents 06/2009 and 03/2010  . PTA of illiac and SFA  multiple   Dr. Ronalee Belts, s/p revision 11/2012  . SPLENECTOMY    . VASCULAR SURGERY     LEG STENTS    Family History  Problem Relation Age of Onset  . Lung cancer Father 83  . Arthritis Father   . Breast cancer Sister 20  . Alcoholism Sister   . Dementia Mother   . Alzheimer's disease Mother   . Arthritis Mother   . Alcoholism Brother   . Epilepsy Sister   . Diabetes Paternal Grandfather   . Colon polyps Paternal Grandfather 106  . Alcoholism Sister   . Alcoholism Sister   . Alcoholism Brother     Social History   Socioeconomic History  . Marital status: Widowed    Spouse name: Not on file  . Number of children: 0  . Years of education: Not on file  . Highest education level: Not on file  Occupational History  . Occupation: Chief Operating Officer at [employer]: Disabled mostly due to neuropathy  Social Needs  . Financial resource strain: Not on file  . Food insecurity    Worry: Not on file    Inability: Not on file  . Transportation needs    Medical: Not on file    Non-medical: Not on file  Tobacco Use  . Smoking status: Former Smoker    Packs/day: 0.75    Years: 40.00    Pack years: 30.00    Types: Cigarettes    Quit date: 04/15/2017    Years since quitting: 1.8  . Smokeless tobacco: Never Used  . Tobacco comment: 1 cigarette a day  Substance and Sexual Activity  . Alcohol use: No  . Drug use: No  . Sexual activity: Never  Lifestyle  . Physical activity    Days per week: Not on file    Minutes per session: Not on file  . Stress: Not on file  Relationships  . Social  Herbalist on phone: Not on file    Gets together: Not on file    Attends religious service: Not on file    Active member of club or organization: Not on file    Attends meetings of clubs or organizations: Not on file    Relationship status: Not on file  . Intimate partner violence    Fear of current or ex partner: Not on file    Emotionally abused: Not on file    Physically abused: Not on file    Forced sexual activity: Not on file  Other Topics Concern  . Not on file  Social History Narrative   Wife died 2019-02-06      Has living will   Wife is health care POA---brother Ronalee Belts is alternate   Would accept resuscitation --but no prolonged ventilation   No tube feeds if cognitively unaware    Review of Systems Has lost a few pounds Not sleeping well    Objective:   Physical Exam  Constitutional: No distress.  Cardiovascular: Normal rate, regular rhythm and normal heart sounds. Exam reveals no gallop.  No murmur heard. Respiratory: Effort normal and breath sounds normal. No respiratory distress. He has no wheezes. He has no rales.  Musculoskeletal:     Comments: Mild tenderness in left subacromial space Fair passive ROM in shoulder without pain Elbow is normal Tender with palpations along  entire left arm  Psychiatric:  Sad but appropriate dress and interaction Affect is normal            Assessment & Plan:

## 2019-02-11 NOTE — Assessment & Plan Note (Signed)
Seems to be related to shoulder Nothing to suggest cardiac ischemia Might be related to his cane--which he always uses in that hand---had him adjust his cane height Consider ice over bursa when pain is bad If persists, would set up with Dr Lorelei Pont

## 2019-02-11 NOTE — Assessment & Plan Note (Addendum)
Hopefully still acceptable control Will check A1c  Lab Results  Component Value Date   HGBA1C 6.1 (A) 02/11/2019   Still excellent control If he hadn't been using the insulin---he can stay off it

## 2019-02-11 NOTE — Assessment & Plan Note (Signed)
Exacerbated by grieving for wife Doesn't seem complicated as yet Discussed reaching out to hospice for help as well No change in meds

## 2019-02-19 DIAGNOSIS — L409 Psoriasis, unspecified: Secondary | ICD-10-CM | POA: Diagnosis not present

## 2019-02-19 DIAGNOSIS — L4050 Arthropathic psoriasis, unspecified: Secondary | ICD-10-CM | POA: Diagnosis not present

## 2019-02-19 DIAGNOSIS — M2550 Pain in unspecified joint: Secondary | ICD-10-CM | POA: Diagnosis not present

## 2019-02-19 DIAGNOSIS — M150 Primary generalized (osteo)arthritis: Secondary | ICD-10-CM | POA: Diagnosis not present

## 2019-02-19 DIAGNOSIS — Z79899 Other long term (current) drug therapy: Secondary | ICD-10-CM | POA: Diagnosis not present

## 2019-02-19 DIAGNOSIS — M0579 Rheumatoid arthritis with rheumatoid factor of multiple sites without organ or systems involvement: Secondary | ICD-10-CM | POA: Diagnosis not present

## 2019-02-23 ENCOUNTER — Other Ambulatory Visit: Payer: Self-pay | Admitting: Internal Medicine

## 2019-02-25 DIAGNOSIS — M0579 Rheumatoid arthritis with rheumatoid factor of multiple sites without organ or systems involvement: Secondary | ICD-10-CM | POA: Diagnosis not present

## 2019-03-04 ENCOUNTER — Ambulatory Visit (INDEPENDENT_AMBULATORY_CARE_PROVIDER_SITE_OTHER): Payer: Medicare Other | Admitting: Psychology

## 2019-03-04 DIAGNOSIS — F4312 Post-traumatic stress disorder, chronic: Secondary | ICD-10-CM | POA: Diagnosis not present

## 2019-03-04 DIAGNOSIS — F4323 Adjustment disorder with mixed anxiety and depressed mood: Secondary | ICD-10-CM | POA: Diagnosis not present

## 2019-03-09 DIAGNOSIS — R809 Proteinuria, unspecified: Secondary | ICD-10-CM | POA: Diagnosis not present

## 2019-03-09 DIAGNOSIS — I10 Essential (primary) hypertension: Secondary | ICD-10-CM | POA: Diagnosis not present

## 2019-03-09 DIAGNOSIS — D631 Anemia in chronic kidney disease: Secondary | ICD-10-CM | POA: Diagnosis not present

## 2019-03-09 DIAGNOSIS — B182 Chronic viral hepatitis C: Secondary | ICD-10-CM | POA: Diagnosis not present

## 2019-03-09 DIAGNOSIS — E875 Hyperkalemia: Secondary | ICD-10-CM | POA: Diagnosis not present

## 2019-03-09 DIAGNOSIS — E785 Hyperlipidemia, unspecified: Secondary | ICD-10-CM | POA: Diagnosis not present

## 2019-03-09 DIAGNOSIS — N184 Chronic kidney disease, stage 4 (severe): Secondary | ICD-10-CM | POA: Diagnosis not present

## 2019-03-09 DIAGNOSIS — E1122 Type 2 diabetes mellitus with diabetic chronic kidney disease: Secondary | ICD-10-CM | POA: Diagnosis not present

## 2019-03-11 ENCOUNTER — Ambulatory Visit (INDEPENDENT_AMBULATORY_CARE_PROVIDER_SITE_OTHER): Payer: Medicare Other | Admitting: Psychology

## 2019-03-11 DIAGNOSIS — F4312 Post-traumatic stress disorder, chronic: Secondary | ICD-10-CM | POA: Diagnosis not present

## 2019-03-11 DIAGNOSIS — F4323 Adjustment disorder with mixed anxiety and depressed mood: Secondary | ICD-10-CM | POA: Diagnosis not present

## 2019-03-16 DIAGNOSIS — N184 Chronic kidney disease, stage 4 (severe): Secondary | ICD-10-CM | POA: Diagnosis not present

## 2019-03-16 DIAGNOSIS — N2581 Secondary hyperparathyroidism of renal origin: Secondary | ICD-10-CM | POA: Diagnosis not present

## 2019-03-16 DIAGNOSIS — E875 Hyperkalemia: Secondary | ICD-10-CM | POA: Diagnosis not present

## 2019-03-16 DIAGNOSIS — E1122 Type 2 diabetes mellitus with diabetic chronic kidney disease: Secondary | ICD-10-CM | POA: Diagnosis not present

## 2019-03-16 DIAGNOSIS — I129 Hypertensive chronic kidney disease with stage 1 through stage 4 chronic kidney disease, or unspecified chronic kidney disease: Secondary | ICD-10-CM | POA: Diagnosis not present

## 2019-03-16 DIAGNOSIS — R801 Persistent proteinuria, unspecified: Secondary | ICD-10-CM | POA: Diagnosis not present

## 2019-03-18 ENCOUNTER — Ambulatory Visit (INDEPENDENT_AMBULATORY_CARE_PROVIDER_SITE_OTHER): Payer: Medicare Other | Admitting: Psychology

## 2019-03-18 DIAGNOSIS — F4312 Post-traumatic stress disorder, chronic: Secondary | ICD-10-CM

## 2019-03-18 DIAGNOSIS — F4323 Adjustment disorder with mixed anxiety and depressed mood: Secondary | ICD-10-CM

## 2019-03-25 ENCOUNTER — Ambulatory Visit (INDEPENDENT_AMBULATORY_CARE_PROVIDER_SITE_OTHER): Payer: Medicare Other | Admitting: Psychology

## 2019-03-25 DIAGNOSIS — F4312 Post-traumatic stress disorder, chronic: Secondary | ICD-10-CM

## 2019-03-25 DIAGNOSIS — F4323 Adjustment disorder with mixed anxiety and depressed mood: Secondary | ICD-10-CM | POA: Diagnosis not present

## 2019-04-01 ENCOUNTER — Ambulatory Visit (INDEPENDENT_AMBULATORY_CARE_PROVIDER_SITE_OTHER): Payer: Medicare Other | Admitting: Psychology

## 2019-04-01 DIAGNOSIS — F4323 Adjustment disorder with mixed anxiety and depressed mood: Secondary | ICD-10-CM

## 2019-04-01 DIAGNOSIS — F4312 Post-traumatic stress disorder, chronic: Secondary | ICD-10-CM

## 2019-04-02 DIAGNOSIS — B351 Tinea unguium: Secondary | ICD-10-CM | POA: Diagnosis not present

## 2019-04-02 DIAGNOSIS — E1142 Type 2 diabetes mellitus with diabetic polyneuropathy: Secondary | ICD-10-CM | POA: Diagnosis not present

## 2019-04-07 ENCOUNTER — Other Ambulatory Visit: Payer: Self-pay

## 2019-04-07 ENCOUNTER — Ambulatory Visit (INDEPENDENT_AMBULATORY_CARE_PROVIDER_SITE_OTHER): Payer: Medicare Other | Admitting: Internal Medicine

## 2019-04-07 ENCOUNTER — Telehealth: Payer: Self-pay | Admitting: *Deleted

## 2019-04-07 ENCOUNTER — Encounter: Payer: Self-pay | Admitting: Internal Medicine

## 2019-04-07 VITALS — BP 140/90 | HR 64 | Temp 98.2°F | Ht 67.5 in | Wt 228.0 lb

## 2019-04-07 DIAGNOSIS — Z794 Long term (current) use of insulin: Secondary | ICD-10-CM | POA: Diagnosis not present

## 2019-04-07 DIAGNOSIS — E1122 Type 2 diabetes mellitus with diabetic chronic kidney disease: Secondary | ICD-10-CM | POA: Diagnosis not present

## 2019-04-07 DIAGNOSIS — F331 Major depressive disorder, recurrent, moderate: Secondary | ICD-10-CM

## 2019-04-07 DIAGNOSIS — Z23 Encounter for immunization: Secondary | ICD-10-CM

## 2019-04-07 DIAGNOSIS — E1151 Type 2 diabetes mellitus with diabetic peripheral angiopathy without gangrene: Secondary | ICD-10-CM

## 2019-04-07 DIAGNOSIS — Z7189 Other specified counseling: Secondary | ICD-10-CM | POA: Diagnosis not present

## 2019-04-07 DIAGNOSIS — I10 Essential (primary) hypertension: Secondary | ICD-10-CM

## 2019-04-07 DIAGNOSIS — R3912 Poor urinary stream: Secondary | ICD-10-CM | POA: Diagnosis not present

## 2019-04-07 DIAGNOSIS — N401 Enlarged prostate with lower urinary tract symptoms: Secondary | ICD-10-CM | POA: Diagnosis not present

## 2019-04-07 DIAGNOSIS — Z0000 Encounter for general adult medical examination without abnormal findings: Secondary | ICD-10-CM | POA: Diagnosis not present

## 2019-04-07 DIAGNOSIS — N184 Chronic kidney disease, stage 4 (severe): Secondary | ICD-10-CM

## 2019-04-07 LAB — COMPREHENSIVE METABOLIC PANEL
ALT: 12 U/L (ref 0–53)
AST: 12 U/L (ref 0–37)
Albumin: 4 g/dL (ref 3.5–5.2)
Alkaline Phosphatase: 80 U/L (ref 39–117)
BUN: 50 mg/dL — ABNORMAL HIGH (ref 6–23)
CO2: 29 mEq/L (ref 19–32)
Calcium: 8.9 mg/dL (ref 8.4–10.5)
Chloride: 101 mEq/L (ref 96–112)
Creatinine, Ser: 3.3 mg/dL — ABNORMAL HIGH (ref 0.40–1.50)
GFR: 18.88 mL/min — ABNORMAL LOW (ref >60.00)
Glucose, Bld: 85 mg/dL (ref 70–99)
Potassium: 5.3 mEq/L — ABNORMAL HIGH (ref 3.5–5.1)
Sodium: 137 mEq/L (ref 135–145)
Total Bilirubin: 0.6 mg/dL (ref 0.2–1.2)
Total Protein: 7.2 g/dL (ref 6.0–8.3)

## 2019-04-07 LAB — RENAL FUNCTION PANEL
Albumin: 4 g/dL (ref 3.5–5.2)
BUN: 50 mg/dL — ABNORMAL HIGH (ref 6–23)
CO2: 29 mEq/L (ref 19–32)
Calcium: 8.9 mg/dL (ref 8.4–10.5)
Chloride: 101 mEq/L (ref 96–112)
Creatinine, Ser: 3.3 mg/dL — ABNORMAL HIGH (ref 0.40–1.50)
GFR: 18.88 mL/min — ABNORMAL LOW (ref >60.00)
Glucose, Bld: 85 mg/dL (ref 70–99)
Phosphorus: 4.5 mg/dL (ref 2.3–4.6)
Potassium: 5.3 mEq/L — ABNORMAL HIGH (ref 3.5–5.1)
Sodium: 137 mEq/L (ref 135–145)

## 2019-04-07 LAB — LIPID PANEL
Cholesterol: 81 mg/dL (ref 0–200)
HDL: 26.8 mg/dL — ABNORMAL LOW (ref >39.00)
LDL Cholesterol: 34 mg/dL (ref 0–99)
NonHDL: 54.57
Total CHOL/HDL Ratio: 3
Triglycerides: 103 mg/dL (ref 0.0–149.0)
VLDL: 20.6 mg/dL (ref 0.0–40.0)

## 2019-04-07 LAB — CBC
HCT: 39.5 % (ref 39.0–52.0)
Hemoglobin: 13.1 g/dL (ref 13.0–17.0)
MCHC: 33.3 g/dL (ref 30.0–36.0)
MCV: 96.6 fl (ref 78.0–100.0)
Platelets: 167 10*3/uL (ref 150.0–400.0)
RBC: 4.08 Mil/uL — ABNORMAL LOW (ref 4.22–5.81)
RDW: 14.4 % (ref 11.5–15.5)
WBC: 7.3 10*3/uL (ref 4.0–10.5)

## 2019-04-07 LAB — HEPATIC FUNCTION PANEL
ALT: 12 U/L (ref 0–53)
AST: 12 U/L (ref 0–37)
Albumin: 4 g/dL (ref 3.5–5.2)
Alkaline Phosphatase: 80 U/L (ref 39–117)
Bilirubin, Direct: 0.1 mg/dL (ref 0.0–0.3)
Total Bilirubin: 0.6 mg/dL (ref 0.2–1.2)
Total Protein: 7.2 g/dL (ref 6.0–8.3)

## 2019-04-07 MED ORDER — TAMSULOSIN HCL 0.4 MG PO CAPS: ORAL_CAPSULE | ORAL | 3 refills | Status: DC

## 2019-04-07 MED ORDER — DULOXETINE HCL 60 MG PO CPEP: 60.0000 mg | ORAL_CAPSULE | Freq: Two times a day (BID) | ORAL | 3 refills | Status: DC

## 2019-04-07 MED ORDER — BUSPIRONE HCL 10 MG PO TABS: ORAL_TABLET | ORAL | 3 refills | Status: DC

## 2019-04-07 MED ORDER — SERTRALINE HCL 100 MG PO TABS: 200.0000 mg | ORAL_TABLET | Freq: Every day | ORAL | 3 refills | Status: DC

## 2019-04-07 NOTE — Assessment & Plan Note (Signed)
Will recheck labs Sees Dr [name]

## 2019-04-07 NOTE — Assessment & Plan Note (Signed)
I have personally reviewed the Medicare Annual Wellness questionnaire and have noted 1. The patient's medical and social history 2. Their use of alcohol, tobacco or illicit drugs 3. Their current medications and supplements 4. The patient's functional ability including ADL's, fall risks, home safety risks and hearing or visual             impairment. 5. Diet and physical activities 6. Evidence for depression or mood disorders  The patients weight, height, BMI and visual acuity have been recorded in the chart I have made referrals, counseling and provided education to the patient based review of the above and I have provided the pt with a written personalized care plan for preventive services.  I have provided you with a copy of your personalized plan for preventive services. Please take the time to review along with your updated medication list.  Flu vaccine today Colon overdue--will set up now Discussed PSA--he wants this Discussed exercise

## 2019-04-07 NOTE — Assessment & Plan Note (Signed)
Also with nephropathy and neuropathy Lab Results  Component Value Date   HGBA1C 6.1 (A) 02/11/2019   Still well controlled

## 2019-04-07 NOTE — Progress Notes (Signed)
Hearing Screening   125Hz  250Hz  500Hz  1000Hz  2000Hz  3000Hz  4000Hz  6000Hz  8000Hz   Right ear:   20 20 20  20     Left ear:   20 20 20   0    Vision Screening Comments: Appointment 04-21-19

## 2019-04-07 NOTE — Assessment & Plan Note (Signed)
BP Readings from Last 3 Encounters:  04/07/19 140/90  02/11/19 130/70  10/24/18 132/72   Reasonable control

## 2019-04-07 NOTE — Progress Notes (Signed)
Subjective:    Patient ID: [name] [name], male    DOB: March 13, 1956, 63 y.o.   MRN: [id number]  HPI Here for Medicare wellness visit and follow up of chronic health conditions Reviewed form and advanced directives Reviewed other doctors No alcohol or tobacco Tries to walk some Now needs some help getting out of shower--son or grandson will help him.  Step son and his wife now live with him Vision and hearing are okay [place] last month--some bruising (trying to get out of shower) Chronic depression Step son and wife doing most of the housework He is concerned about memory problems  Still having some rough times--especially at night Still sees Dr Rexene Edison Some trouble sleeping Hard time getting motivated---to even get dressed or out of bed Daughter still shops for him and brings him to appointments (not driving)  Ongoing problems with memory loss Not clear if this is different  Still takes the tamsulosin Fair urine flow on that Nocturia x 2 usually--sometimes more  Checks sugars daily Usually 80-120---- no low sugar reactions though Ongoing numbness, tingling and pain in feet  Gets infusion for RA every 6 weeks This helps considerably  Does bruise easily Mild low platelets has been stable  Chronic CKD 4 Sees nephrology  Current Outpatient Medications on File Prior to Visit  Medication Sig Dispense Refill  . ACCU-CHEK FASTCLIX LANCETS MISC Use to check sugar 1 time daily. 100 each 5  . albuterol (PROAIR HFA) 108 (90 Base) MCG/ACT inhaler Inhale 2 puffs into the lungs every 6 (six) hours as needed for shortness of breath. 1 Inhaler 3  . aspirin 81 MG tablet Take 81 mg by mouth daily.    [name] buPROPion (WELLBUTRIN XL) 300 MG 24 hr tablet Take 1 tablet (300 mg total) by mouth daily. 90 tablet 3  . busPIRone (BUSPAR) 10 MG tablet TAKE 2 TABLETS(20 MG) BY MOUTH TWICE DAILY 360 tablet 3  . calcitRIOL (ROCALTROL) 0.25 MCG capsule Take 0.25 mcg by mouth 3 (three) times a week.     . carvedilol (COREG) 12.5 MG tablet TAKE 1 TABLET BY MOUTH TWICE DAILY 180 tablet 3  . Cholecalciferol (VITAMIN D PO) Take 1 capsule by mouth daily.     . clopidogrel (PLAVIX) 75 MG tablet TAKE 1 TABLET BY MOUTH EVERY DAY 90 tablet 0  . CONTOUR TEST test strip TEST EVERY DAY 100 strip 11  . famotidine (PEPCID) 40 MG tablet Take 1 tablet (40 mg total) by mouth 2 (two) times daily. 180 tablet 3  . finasteride (PROSCAR) 5 MG tablet Take 5 mg by mouth daily.    . furosemide (LASIX) 20 MG tablet Take 20 mg by mouth daily.    . Glucosamine-Chondroit-Vit C-Mn (GLUCOSAMINE CHONDROITIN COMPLX) CAPS Take by mouth.    Vanessa Kick Ethyl (VASCEPA) 1 g CAPS Take 2 capsules (2 g total) by mouth daily. 120 capsule 5  . insulin NPH Human (NOVOLIN N RELION) 100 UNIT/ML injection Inject 0.2 mLs (20 Units total) into the skin at bedtime. 10 mL 11  . ketoconazole (NIZORAL) 2 % cream Apply topically 2 (two) times daily. For rash 15 g 0  . losartan (COZAAR) 100 MG tablet TAKE 1 TABLET(100 MG) BY MOUTH DAILY 30 tablet 2  . MAGNESIUM PO Take 400 mg by mouth daily.     . Multiple Vitamin (MULTIVITAMIN) capsule Take by mouth.    . nitroGLYCERIN (NITROSTAT) 0.4 MG SL tablet Place 1 tablet (0.4 mg total) under the tongue every 5 (five)  minutes as needed. 25 tablet 1  . RELION INSULIN SYRINGE 31G X 15/64" 1 ML MISC ONE INJECTION THREE TIMES DAILY 200 each 2  . sertraline (ZOLOFT) 100 MG tablet Take 2 tablets (200 mg total) by mouth daily. 180 tablet 3  . simvastatin (ZOCOR) 20 MG tablet TAKE 1 TABLET BY MOUTH ONCE DAILY AT 6PM 90 tablet 3  . tamsulosin (FLOMAX) 0.4 MG CAPS capsule take 2 capsule by mouth daily (Patient taking differently: Take 0.4 mg by mouth 2 (two) times daily. ) 60 capsule 11  . cyclobenzaprine (FLEXERIL) 5 MG tablet Take 1-2 tablets (5-10 mg total) by mouth at bedtime as needed for muscle spasms. (Patient not taking: Reported on 04/07/2019) 30 tablet 0  . DULoxetine (CYMBALTA) 60 MG capsule Take 1 capsule  by mouth 2 (two) times daily.     No current facility-administered medications on file prior to visit.     Allergies  Allergen Reactions  . Contrast Media [Iodinated Diagnostic Agents] Rash and Other (See Comments)    Got very hot and red   . Glipizide Other (See Comments)    ANTIDIABETICS. Burning  . Metrizamide Other (See Comments)    Got very hot and red  . Penicillins Hives and Swelling    Has patient had a PCN reaction causing immediate rash, facial/tongue/throat swelling, SOB or lightheadedness with hypotension: yes Has patient had a PCN reaction causing severe rash involving mucus membranes or skin necrosis: no  Has patient had a PCN reaction that required hospitalization: yes Has patient had a PCN reaction occurring within the last 10 years: no If all of the above answers are "NO", then may proceed with Cephalosporin use.     Past Medical History:  Diagnosis Date  . Adenomatous colon polyp   . Allergy   . Angina at rest [facility])    chronic  . Anxiety   . Arthritis    RA  . Bell's palsy 2007  . Carotid artery occlusion   . Cataract    Dr. Dawna Part  . Coronary artery disease   . Depression   . Diabetes mellitus   . Diverticulosis   . Duodenitis   . DVT (deep venous thrombosis) (HCC)    in leg  . Dyspnea    DOE  . Gastropathy   . GERD (gastroesophageal reflux disease)   . Heart murmur   . Helicobacter pylori gastritis   . Hyperlipidemia   . Hypertension   . Kidney failure   . Mild aortic stenosis   . Neuropathy   . Obesity   . Peripheral vascular disease ([place])   . Post splenectomy syndrome   . Psoriasis   . Psoriatic arthritis ([place])   . Renal disorder    stage 4 kidney failure  . Sleep apnea    uses cpap  . SOB (shortness of breath)   . Stroke ([place])   . Tobacco use disorder    recently quit  . Tubular adenoma 08/2013   Dr. Hilarie Fredrickson  . Weak urinary stream     Past Surgical History:  Procedure Laterality Date  . CARDIAC CATHETERIZATION  08/2010    LAD: 80% ISR, RCA: 80% ostial, OM 80-90%  . CAROTID ENDARTERECTOMY  08/2010   left/ Dr. Kellie Simmering  . CATARACT EXTRACTION W/PHACO Left 07/09/2017   Procedure: CATARACT EXTRACTION PHACO AND INTRAOCULAR LENS PLACEMENT (IOC);  Surgeon: Birder Robson, MD;  Location: [facility] ORS;  Service: Ophthalmology;  Laterality: Left;  [place] 00:23 AP% 14.2 CDE 3.26 Fluid pack lot #  [id number]  . CATARACT EXTRACTION W/PHACO Right 09/10/2017   Procedure: CATARACT EXTRACTION PHACO AND INTRAOCULAR LENS PLACEMENT (IOC);  Surgeon: Birder Robson, MD;  Location: [facility] ORS;  Service: Ophthalmology;  Laterality: Right;  [place] 00:26.4 AP% 17.3 CDE 4.59 Fluid Pack Lot # [id number]  . COLONOSCOPY    . CORONARY ANGIOPLASTY     LAD: before CABG  . CORONARY ARTERY BYPASS GRAFT  09/06/2010   At [facility]: LIMA to LAD, left radial to RCA, sequential SVG to OM3 and 4  . heart stents  Jan 2011   leg stents 06/2009 and 03/2010  . PTA of illiac and SFA  multiple   Dr. Ronalee Belts, s/p revision 11/2012  . SPLENECTOMY    . VASCULAR SURGERY     LEG STENTS    Family History  Problem Relation Age of Onset  . Lung cancer Father 47  . Arthritis Father   . Breast cancer Sister 64  . Alcoholism Sister   . Dementia Mother   . Alzheimer's disease Mother   . Arthritis Mother   . Alcoholism Brother   . Epilepsy Sister   . Diabetes Paternal Grandfather   . Colon polyps Paternal Grandfather 56  . Alcoholism Sister   . Alcoholism Sister   . Alcoholism Brother     Social History   Socioeconomic History  . Marital status: Widowed    Spouse name: Not on file  . Number of children: 0  . Years of education: Not on file  . Highest education level: Not on file  Occupational History  . 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 Financial resource strain: Not on file  . Food insecurity    Worry: Not on file    Inability: Not on file  . Transportation needs    Medical: Not on file     Non-medical: Not on file  Tobacco Use  . Smoking status: Former Smoker    Packs/day: 0.75    Years: 40.00    Pack years: 30.00    Types: Cigarettes    Quit date: 04/15/2017    Years since quitting: 1.9  . Smokeless tobacco: Never Used  . Tobacco comment: 1 cigarette a day  Substance and Sexual Activity  . Alcohol use: No  . Drug use: No  . Sexual activity: Never  Lifestyle  . Physical activity    Days per week: Not on file    Minutes per session: Not on file  . Stress: Not on file  Relationships  . Social Herbalist on phone: Not on file    Gets together: Not on file    Attends religious service: Not on file    Active member of club or organization: Not on file    Attends meetings of clubs or organizations: Not on file    Relationship status: Not on file  . Intimate partner violence    Fear of current or ex partner: Not on file    Emotionally abused: Not on file    Physically abused: Not on file    Forced sexual activity: Not on file  Other Topics Concern  . Not on file  Social History Narrative   Wife died 2019/02/02      Has living will   Wife is health care POA---brother Ronalee Belts is alternate   Would accept resuscitation --but no prolonged ventilation   No tube feeds if cognitively unaware   Review  of Systems Appetite not great since wife died---has lost some weight Does have some glucerna Does wear seat belt Needs new dentures--will be getting a new place for this Bowels are fine--no blood No rash or suspicious lesions    Objective:   Physical Exam  Constitutional: He is oriented to person, place, and time. He appears well-developed. No distress.  HENT:  Mouth/Throat: No oropharyngeal exudate.  Neck: No thyromegaly present.  Cardiovascular: Normal rate and regular rhythm. Exam reveals no gallop.  Gr 3/6 aortic systolic murmur Feet warm but no palpable pulses  Respiratory: Effort normal and breath sounds normal. No respiratory distress. He has no  wheezes. He has no rales.  GI: Soft. There is no abdominal tenderness.  Musculoskeletal:        General: No edema.  Lymphadenopathy:    He has no cervical adenopathy.  Neurological: He is alert and oriented to person, place, and time.  President--- "Dwaine Deter, [place]" [phone] [id number] Recall 3/3  Decreased sensation in feet  Skin:  No foot lesions  Psychiatric: He has a normal mood and affect. His behavior is normal.           Assessment & Plan:

## 2019-04-07 NOTE — Assessment & Plan Note (Signed)
Revised now since wife's death

## 2019-04-07 NOTE — Addendum Note (Signed)
Addended by: Pilar Grammes on: 04/07/2019 12:01 PM   Modules accepted: Orders

## 2019-04-07 NOTE — Assessment & Plan Note (Signed)
Still adjusting to loss of wife It helps to have step son and his wife living with him

## 2019-04-07 NOTE — Telephone Encounter (Signed)
Left message for patient to notify them that it is time to schedule annual low dose lung cancer screening CT scan. Instructed patient to call back to verify information prior to the scan being scheduled. CB# [phone]

## 2019-04-08 ENCOUNTER — Ambulatory Visit (INDEPENDENT_AMBULATORY_CARE_PROVIDER_SITE_OTHER): Payer: Medicare Other | Admitting: Psychology

## 2019-04-08 DIAGNOSIS — F4323 Adjustment disorder with mixed anxiety and depressed mood: Secondary | ICD-10-CM

## 2019-04-08 DIAGNOSIS — F4312 Post-traumatic stress disorder, chronic: Secondary | ICD-10-CM | POA: Diagnosis not present

## 2019-04-09 DIAGNOSIS — M0579 Rheumatoid arthritis with rheumatoid factor of multiple sites without organ or systems involvement: Secondary | ICD-10-CM | POA: Diagnosis not present

## 2019-04-09 DIAGNOSIS — Z79899 Other long term (current) drug therapy: Secondary | ICD-10-CM | POA: Diagnosis not present

## 2019-04-13 DIAGNOSIS — E113393 Type 2 diabetes mellitus with moderate nonproliferative diabetic retinopathy without macular edema, bilateral: Secondary | ICD-10-CM | POA: Diagnosis not present

## 2019-04-13 LAB — HM DIABETES EYE EXAM

## 2019-04-15 ENCOUNTER — Ambulatory Visit (INDEPENDENT_AMBULATORY_CARE_PROVIDER_SITE_OTHER): Payer: Medicare Other | Admitting: Psychology

## 2019-04-15 DIAGNOSIS — F4323 Adjustment disorder with mixed anxiety and depressed mood: Secondary | ICD-10-CM | POA: Diagnosis not present

## 2019-04-17 ENCOUNTER — Encounter (INDEPENDENT_AMBULATORY_CARE_PROVIDER_SITE_OTHER): Payer: PRIVATE HEALTH INSURANCE

## 2019-04-17 ENCOUNTER — Ambulatory Visit (INDEPENDENT_AMBULATORY_CARE_PROVIDER_SITE_OTHER): Payer: PRIVATE HEALTH INSURANCE | Admitting: Nurse Practitioner

## 2019-04-17 ENCOUNTER — Ambulatory Visit (INDEPENDENT_AMBULATORY_CARE_PROVIDER_SITE_OTHER): Payer: Medicare Other | Admitting: Vascular Surgery

## 2019-04-17 ENCOUNTER — Encounter (INDEPENDENT_AMBULATORY_CARE_PROVIDER_SITE_OTHER): Payer: Medicare Other

## 2019-04-22 ENCOUNTER — Ambulatory Visit: Payer: Medicare Other | Admitting: Psychology

## 2019-04-22 ENCOUNTER — Telehealth: Payer: Self-pay | Admitting: *Deleted

## 2019-04-22 NOTE — Telephone Encounter (Signed)
-----   Message from Jerene Bears, MD sent at 04/22/2019 12:19 PM EDT ----- See note from PCP He needs colonoscopy JMP ----- Message ----- From: [name], MD Sent: 04/07/2019  11:47 AM EDT To: Jerene Bears, MD  [name], He recently lost his wife and let things go. He is now ready to schedule his follow up colonoscopy [name] [name]

## 2019-04-22 NOTE — Telephone Encounter (Signed)
Left message for patient to call back. He needs office visit to discuss colonoscopy recall (Hx tubular adenomas). He is on anticoagulation.

## 2019-04-23 ENCOUNTER — Encounter: Payer: Self-pay | Admitting: Internal Medicine

## 2019-04-23 NOTE — Telephone Encounter (Signed)
Patient has scheduled appointment with Dr Hilarie Fredrickson for 06/09/2019.

## 2019-04-27 ENCOUNTER — Ambulatory Visit
Admission: EM | Admit: 2019-04-27 | Discharge: 2019-04-27 | Disposition: A | Discharge status: Home / Self Care | Payer: Medicare Other | Attending: Family Medicine | Admitting: Family Medicine

## 2019-04-27 ENCOUNTER — Ambulatory Visit: Payer: Medicare Other

## 2019-04-27 ENCOUNTER — Encounter: Payer: Self-pay | Admitting: Emergency Medicine

## 2019-04-27 ENCOUNTER — Other Ambulatory Visit: Payer: Self-pay

## 2019-04-27 DIAGNOSIS — M79662 Pain in left lower leg: Secondary | ICD-10-CM | POA: Diagnosis not present

## 2019-04-27 DIAGNOSIS — S8992XA Unspecified injury of left lower leg, initial encounter: Secondary | ICD-10-CM | POA: Diagnosis not present

## 2019-04-27 DIAGNOSIS — L03116 Cellulitis of left lower limb: Secondary | ICD-10-CM | POA: Diagnosis not present

## 2019-04-27 MED ORDER — HYDROCODONE-ACETAMINOPHEN 5-325 MG PO TABS: ORAL_TABLET | ORAL | 0 refills | Status: DC

## 2019-04-27 MED ORDER — DOXYCYCLINE HYCLATE 100 MG PO TABS: 100.0000 mg | ORAL_TABLET | Freq: Two times a day (BID) | ORAL | 0 refills | Status: DC

## 2019-04-27 NOTE — ED Triage Notes (Signed)
Patient states over 2 weeks ago he hit his left lower leg on a step going into his house. Patient c/o pain in his left lower leg.

## 2019-04-27 NOTE — Discharge Instructions (Signed)
Elevate leg, warm compresses

## 2019-04-27 NOTE — ED Provider Notes (Signed)
[facility]    CSN: [id number] Arrival date & time: 04/27/19  1962      History   Chief Complaint Chief Complaint  Patient presents with  . Leg Pain    HPI [name] [name] is a 63 y.o. male.   63 yo male with a c/o left lower leg pain for the past 2 weeks since injuring it going down a step. Patient states area is now red and slightly swollen.    Leg Pain   Past Medical History:  Diagnosis Date  . Adenomatous colon polyp   . Allergy   . Angina at rest [facility])    chronic  . Anxiety   . Arthritis    RA  . Bell's palsy 2007  . Carotid artery occlusion   . Cataract    Dr. Dawna Part  . Coronary artery disease   . Depression   . Diabetes mellitus   . Diverticulosis   . Duodenitis   . DVT (deep venous thrombosis) (HCC)    in leg  . Dyspnea    DOE  . Gastropathy   . GERD (gastroesophageal reflux disease)   . Heart murmur   . Helicobacter pylori gastritis   . Hyperlipidemia   . Hypertension   . Kidney failure   . Mild aortic stenosis   . Neuropathy   . Obesity   . Peripheral vascular disease ([place])   . Post splenectomy syndrome   . Psoriasis   . Psoriatic arthritis ([place])   . Renal disorder    stage 4 kidney failure  . Sleep apnea    uses cpap  . SOB (shortness of breath)   . Stroke ([place])   . Tobacco use disorder    recently quit  . Tubular adenoma 08/2013   Dr. Hilarie Fredrickson  . Weak urinary stream     Patient Active Problem List   Diagnosis Date Noted  . Left arm pain 02/11/2019  . Gastroesophageal reflux 03/25/2018  . Atherosclerosis of aorta ([place]) 09/04/2017  . Thoracic aortic aneurysm ([place]) 09/04/2017  . Right thyroid nodule 04/12/2017  . Thrombocytopenia ([place]) 03/16/2017  . CKD stage 4 due to type 2 diabetes mellitus ([place]) 03/15/2017  . Advance directive discussed with patient 03/15/2017  . BMI 40.0-44.9, adult ([place]) 09/12/2016  . Psoriatic arthritis ([place])   . Lung nodule < 6cm on CT 03/29/2016  . COPD with acute bronchitis ([place])  03/29/2016  . Headache 09/14/2015  . Constipation 10/05/2014  . PVD (peripheral vascular disease) ([place]) 04/23/2014  . Memory loss 04/23/2014  . Aortic valve disorder 01/11/2014  . Preventative health care 11/23/2013  . Benign prostatic hypertrophy without urinary obstruction 03/25/2013  . Enlarged prostate without lower urinary tract symptoms (luts) 03/25/2013  . Peripheral vascular disease due to secondary diabetes mellitus ([place]) 03/25/2013  . Obesity 02/12/2013  . Coronary atherosclerosis 06/06/2012  . Nonrheumatic aortic valve stenosis   . Low back pain 12/27/2011  . MDD (major depressive disorder), recurrent episode ([place]) 05/28/2011  . Carotid artery disease ([place]) 04/17/2011  . Occlusion and stenosis of carotid artery 04/17/2011  . Sleep apnea 03/05/2011  . Psoriasis 03/05/2011  . Neuropathy ([place]) 03/05/2011  . Type 2 diabetes mellitus with diabetic peripheral angiopathy without gangrene ([place]) 03/05/2011  . Polyneuropathy 03/05/2011  . Coronary artery disease   . Hyperlipidemia, mixed   . Essential hypertension     Past Surgical History:  Procedure Laterality Date  . CARDIAC CATHETERIZATION  08/2010   LAD: 80% ISR, RCA: 80% ostial, OM  80-90%  . CAROTID ENDARTERECTOMY  08/2010   left/ Dr. Kellie Simmering  . CATARACT EXTRACTION W/PHACO Left 07/09/2017   Procedure: CATARACT EXTRACTION PHACO AND INTRAOCULAR LENS PLACEMENT (IOC);  Surgeon: Birder Robson, MD;  Location: [facility] ORS;  Service: Ophthalmology;  Laterality: Left;  [place] 00:23 AP% 14.2 CDE 3.26 Fluid pack lot # [id number]  . CATARACT EXTRACTION W/PHACO Right 09/10/2017   Procedure: CATARACT EXTRACTION PHACO AND INTRAOCULAR LENS PLACEMENT (IOC);  Surgeon: Birder Robson, MD;  Location: [facility] ORS;  Service: Ophthalmology;  Laterality: Right;  [place] 00:26.4 AP% 17.3 CDE 4.59 Fluid Pack Lot # [id number]  . COLONOSCOPY    . CORONARY ANGIOPLASTY     LAD: before CABG  . CORONARY ARTERY BYPASS GRAFT  09/06/2010   At [facility]: LIMA to LAD,  left radial to RCA, sequential SVG to OM3 and 4  . heart stents  Jan 2011   leg stents 06/2009 and 03/2010  . PTA of illiac and SFA  multiple   Dr. Ronalee Belts, s/p revision 11/2012  . SPLENECTOMY    . VASCULAR SURGERY     LEG STENTS       Home Medications    Prior to Admission medications   Medication Sig Start Date End Date Taking? Authorizing Provider  ACCU-CHEK FASTCLIX LANCETS MISC Use to check sugar 1 time daily. 06/13/18  Yes Philemon Kingdom, MD  albuterol (PROAIR HFA) 108 (90 Base) MCG/ACT inhaler Inhale 2 puffs into the lungs every 6 (six) hours as needed for shortness of breath. 08/26/18  Yes Wellington Hampshire, MD  aspirin 81 MG tablet Take 81 mg by mouth daily.   Yes [provider]  buPROPion (WELLBUTRIN XL) 300 MG 24 hr tablet Take 1 tablet (300 mg total) by mouth daily. 12/23/18  Yes Viviana Simpler I, MD  busPIRone (BUSPAR) 10 MG tablet TAKE 2 TABLETS(20 MG) BY MOUTH TWICE DAILY 04/07/19  Yes Viviana Simpler I, MD  calcitRIOL (ROCALTROL) 0.25 MCG capsule Take 0.25 mcg by mouth 3 (three) times a week.   Yes [provider]  carvedilol (COREG) 12.5 MG tablet TAKE 1 TABLET BY MOUTH TWICE DAILY 06/02/18  Yes Wellington Hampshire, MD  clopidogrel (PLAVIX) 75 MG tablet TAKE 1 TABLET BY MOUTH EVERY DAY 12/23/18  Yes Wellington Hampshire, MD  CONTOUR TEST test strip TEST EVERY DAY 01/22/19  Yes Philemon Kingdom, MD  famotidine (PEPCID) 40 MG tablet Take 1 tablet (40 mg total) by mouth 2 (two) times daily. 07/25/18  Yes [name], MD  finasteride (PROSCAR) 5 MG tablet Take 5 mg by mouth daily.   Yes [provider]  furosemide (LASIX) 20 MG tablet Take 20 mg by mouth daily.   Yes [provider]  Glucosamine-Chondroit-Vit C-Mn (GLUCOSAMINE CHONDROITIN COMPLX) CAPS Take by mouth.   Yes [provider]  Icosapent Ethyl (VASCEPA) 1 g CAPS Take 2 capsules (2 g total) by mouth daily. 12/20/17  Yes Wellington Hampshire, MD  insulin NPH Human (NOVOLIN N  RELION) 100 UNIT/ML injection Inject 0.2 mLs (20 Units total) into the skin at bedtime. 12/26/18  Yes Philemon Kingdom, MD  losartan (COZAAR) 100 MG tablet TAKE 1 TABLET(100 MG) BY MOUTH DAILY 03/21/18  Yes Wellington Hampshire, MD  MAGNESIUM PO Take 400 mg by mouth daily.    Yes [provider]  Multiple Vitamin (MULTIVITAMIN) capsule Take by mouth.   Yes [provider]  sertraline (ZOLOFT) 100 MG tablet Take 2 tablets (200 mg total) by mouth daily. 04/07/19 04/06/20 Yes  [name], MD  simvastatin (ZOCOR) 20 MG tablet TAKE 1 TABLET BY MOUTH ONCE DAILY AT 6PM 04/17/18  Yes [name], MD  tamsulosin [facility]) 0.4 MG CAPS capsule take 2 capsule by mouth daily 04/07/19  Yes [name], MD  Cholecalciferol (VITAMIN D PO) Take 1 capsule by mouth daily.     [provider]  cyclobenzaprine (FLEXERIL) 5 MG tablet Take 1-2 tablets (5-10 mg total) by mouth at bedtime as needed for muscle spasms. 07/25/18   [name], MD  doxycycline (VIBRA-TABS) 100 MG tablet Take 1 tablet (100 mg total) by mouth 2 (two) times daily. 04/27/19   Norval Gable, MD  DULoxetine (CYMBALTA) 60 MG capsule Take 1 capsule (60 mg total) by mouth 2 (two) times daily. 04/07/19   [name], MD  HYDROcodone-acetaminophen (NORCO/VICODIN) 5-325 MG tablet 1-2 tabs po bid prn 04/27/19   Norval Gable, MD  ketoconazole (NIZORAL) 2 % cream Apply topically 2 (two) times daily. For rash 10/08/17   [name], MD  nitroGLYCERIN (NITROSTAT) 0.4 MG SL tablet Place 1 tablet (0.4 mg total) under the tongue every 5 (five) minutes as needed. 06/24/12   Wellington Hampshire, MD  RELION INSULIN SYRINGE 31G X 15/64" 1 ML MISC ONE INJECTION THREE TIMES DAILY 01/20/18   Philemon Kingdom, MD    Family History Family History  Problem Relation Age of Onset  . Lung cancer Father 8  . Arthritis Father   . Breast cancer Sister 53  . Alcoholism Sister   . Dementia Mother   . Alzheimer's disease Mother    . Arthritis Mother   . Alcoholism Brother   . Epilepsy Sister   . Diabetes Paternal Grandfather   . Colon polyps Paternal Grandfather 66  . Alcoholism Sister   . Alcoholism Sister   . Alcoholism Brother     Social History Social History   Tobacco Use  . Smoking status: Former Smoker    Packs/day: 0.75    Years: 40.00    Pack years: 30.00    Types: Cigarettes    Quit date: 04/15/2017    Years since quitting: 2.0  . Smokeless tobacco: Never Used  . Tobacco comment: 1 cigarette a day  Substance Use Topics  . Alcohol use: No  . Drug use: No     Allergies   Contrast media [iodinated diagnostic agents], Glipizide, Metrizamide, and Penicillins   Review of Systems Review of Systems   Physical Exam Triage Vital Signs ED Triage Vitals  Enc Vitals Group     BP 04/27/19 0827 (!) 135/94     Pulse Rate 04/27/19 0827 (!) 59     Resp 04/27/19 0827 18     Temp 04/27/19 0827 97.8 F (36.6 C)     Temp Source 04/27/19 0827 Oral     SpO2 04/27/19 0827 100 %     Weight 04/27/19 0824 227 lb (103 kg)     Height 04/27/19 0824 5\' 7"  (1.702 m)     Head Circumference --      Peak Flow --      Pain Score 04/27/19 0824 10     Pain Loc --      Pain Edu? --      Excl. in [place]? --    No data found.  Updated Vital Signs BP (!) 135/94 (BP Location: Right Arm)   Pulse (!) 59   Temp 97.8 F (36.6 C) (Oral)   Resp 18   Ht  5\' 7"  (1.702 m)   Wt 103 kg   SpO2 100%   BMI 35.55 kg/m   Visual Acuity Right Eye Distance:   Left Eye Distance:   Bilateral Distance:    Right Eye Near:   Left Eye Near:    Bilateral Near:     Physical Exam Vitals signs and nursing note reviewed.  Constitutional:      General: He is not in acute distress.    Appearance: He is not toxic-appearing or diaphoretic.  Musculoskeletal:     Left lower leg: He exhibits bony tenderness and swelling (on left anterior lower leg (shin area); mild with blanchable erythema, warmth and tenderness to palpation). He  exhibits no deformity and no laceration.     Comments: Left lower leg neurovascularly intact  Neurological:     Mental Status: He is alert.      UC Treatments / Results  Labs (all labs ordered are listed, but only abnormal results are displayed) Labs Reviewed - No data to display  EKG   Radiology Dg Tibia/fibula Left  Result Date: 04/27/2019 CLINICAL DATA:  Pain and redness at a site of trauma from 2 weeks ago. EXAM: LEFT TIBIA AND FIBULA - 2 VIEW COMPARISON:  None. FINDINGS: A vascular stent is identified. No fractures are noted. No foreign bodies are seen. No soft tissue gas or bony erosion is noted. IMPRESSION: No acute abnormalities identified. Electronically Signed   By: Dorise Bullion III M.D   On: 04/27/2019 09:02    Procedures Procedures (including critical care time)  Medications Ordered in UC Medications - No data to display  Initial Impression / Assessment and Plan / UC Course  I have reviewed the triage vital signs and the nursing notes.  Pertinent labs & imaging results that were available during my care of the patient were reviewed by me and considered in my medical decision making (see chart for details).      Final Clinical Impressions(s) / UC Diagnoses   Final diagnoses:  Cellulitis of left anterior lower leg     Discharge Instructions     Elevate leg, warm compresses    ED Prescriptions    Medication Sig Dispense Auth. Provider   doxycycline (VIBRA-TABS) 100 MG tablet Take 1 tablet (100 mg total) by mouth 2 (two) times daily. 20 tablet Norval Gable, MD   HYDROcodone-acetaminophen (NORCO/VICODIN) 5-325 MG tablet 1-2 tabs po bid prn 6 tablet Norval Gable, MD      1. x-ray results and diagnosis reviewed with patient 2. rx as per orders above; reviewed possible side effects, interactions, risks and benefits  3. Recommend supportive treatment as above 4. Follow-up prn if symptoms worsen or don't improve   I have reviewed the PDMP during  this encounter.   Norval Gable, MD 04/27/19 [id number]

## 2019-04-29 ENCOUNTER — Ambulatory Visit (INDEPENDENT_AMBULATORY_CARE_PROVIDER_SITE_OTHER): Payer: Medicare Other | Admitting: Psychology

## 2019-04-29 DIAGNOSIS — F4312 Post-traumatic stress disorder, chronic: Secondary | ICD-10-CM | POA: Diagnosis not present

## 2019-05-06 ENCOUNTER — Ambulatory Visit: Payer: Medicare Other | Admitting: Psychology

## 2019-05-12 ENCOUNTER — Encounter: Payer: Self-pay | Admitting: Ophthalmology

## 2019-05-13 ENCOUNTER — Ambulatory Visit (INDEPENDENT_AMBULATORY_CARE_PROVIDER_SITE_OTHER): Payer: Medicare Other | Admitting: Psychology

## 2019-05-13 DIAGNOSIS — F4323 Adjustment disorder with mixed anxiety and depressed mood: Secondary | ICD-10-CM

## 2019-05-15 DIAGNOSIS — Z79899 Other long term (current) drug therapy: Secondary | ICD-10-CM | POA: Diagnosis not present

## 2019-05-15 DIAGNOSIS — L4050 Arthropathic psoriasis, unspecified: Secondary | ICD-10-CM | POA: Diagnosis not present

## 2019-05-15 DIAGNOSIS — M150 Primary generalized (osteo)arthritis: Secondary | ICD-10-CM | POA: Diagnosis not present

## 2019-05-15 DIAGNOSIS — M0579 Rheumatoid arthritis with rheumatoid factor of multiple sites without organ or systems involvement: Secondary | ICD-10-CM | POA: Diagnosis not present

## 2019-05-15 DIAGNOSIS — I739 Peripheral vascular disease, unspecified: Secondary | ICD-10-CM | POA: Diagnosis not present

## 2019-05-15 DIAGNOSIS — L409 Psoriasis, unspecified: Secondary | ICD-10-CM | POA: Diagnosis not present

## 2019-05-15 DIAGNOSIS — G609 Hereditary and idiopathic neuropathy, unspecified: Secondary | ICD-10-CM | POA: Diagnosis not present

## 2019-05-15 DIAGNOSIS — M2550 Pain in unspecified joint: Secondary | ICD-10-CM | POA: Diagnosis not present

## 2019-05-18 ENCOUNTER — Other Ambulatory Visit: Payer: Self-pay | Admitting: *Deleted

## 2019-05-18 MED ORDER — CARVEDILOL 12.5 MG PO TABS: 12.5000 mg | ORAL_TABLET | Freq: Two times a day (BID) | ORAL | 0 refills | Status: DC

## 2019-05-20 ENCOUNTER — Ambulatory Visit: Payer: Medicare Other | Admitting: Psychology

## 2019-05-27 ENCOUNTER — Other Ambulatory Visit: Payer: Self-pay

## 2019-05-27 ENCOUNTER — Encounter (INDEPENDENT_AMBULATORY_CARE_PROVIDER_SITE_OTHER): Payer: Self-pay | Admitting: Nurse Practitioner

## 2019-05-27 ENCOUNTER — Telehealth (INDEPENDENT_AMBULATORY_CARE_PROVIDER_SITE_OTHER): Payer: Self-pay

## 2019-05-27 ENCOUNTER — Ambulatory Visit (INDEPENDENT_AMBULATORY_CARE_PROVIDER_SITE_OTHER): Payer: Medicare Other | Admitting: Nurse Practitioner

## 2019-05-27 ENCOUNTER — Ambulatory Visit (INDEPENDENT_AMBULATORY_CARE_PROVIDER_SITE_OTHER): Payer: Medicare Other

## 2019-05-27 ENCOUNTER — Ambulatory Visit: Payer: Medicare Other | Admitting: Psychology

## 2019-05-27 VITALS — BP 145/82 | HR 80 | Resp 16 | Wt 230.0 lb

## 2019-05-27 DIAGNOSIS — K2100 Gastro-esophageal reflux disease with esophagitis, without bleeding: Secondary | ICD-10-CM

## 2019-05-27 DIAGNOSIS — E782 Mixed hyperlipidemia: Secondary | ICD-10-CM

## 2019-05-27 DIAGNOSIS — I739 Peripheral vascular disease, unspecified: Secondary | ICD-10-CM

## 2019-05-27 NOTE — Telephone Encounter (Signed)
I attempted to contact the patient regarding being scheduled for a procedure, a message was left for a return call.

## 2019-05-27 NOTE — Telephone Encounter (Signed)
Spoke with the patient and he is now scheduled with Dr. Delana Meyer for left leg angio on 06/09/2019 with a 11:00 am arrival time to the [facility]. Patient will do his Covid testing on 06/05/2019 between 12:30-2:30 pm at the [place]. Pre-procedure instructions were discussed and will be mailed to the patient.

## 2019-05-31 ENCOUNTER — Encounter (INDEPENDENT_AMBULATORY_CARE_PROVIDER_SITE_OTHER): Payer: Self-pay | Admitting: Nurse Practitioner

## 2019-05-31 NOTE — Progress Notes (Signed)
SUBJECTIVE:  Patient ID: [name] [name], male    DOB: 10-04-1955, 63 y.o.   MRN: [id number] Chief Complaint  Patient presents with  . Follow-up    ultrasound follow up    HPI  [name] [name] is a 63 y.o. male The patient returns to the office for followup and review of the noninvasive studies. There has been a significant deterioration in the lower extremity symptoms.  The patient notes interval shortening of their claudication distance and development of mild rest pain symptoms. No new ulcers or wounds have occurred since the last visit.  There have been no significant changes to the patient's overall health care.  The patient denies amaurosis fugax or recent TIA symptoms. There are no recent neurological changes noted. The patient denies history of DVT, PE or superficial thrombophlebitis. The patient denies recent episodes of angina or shortness of breath.   ABI's Rt=1.14 and Lt=0.93 (previous ABI's Rt=1.04 and Lt=0.75) Duplex US of the lower extremity arterial system shows a greater than 95% stenosis in the mid SFA.  The previously placed stent is stenotic.  The left lower extremity also has monophasic waveforms throughout.  The right lower extremity has biphasic/monophasic waveforms.  The right lower extremity has strong toe waveforms.  The left lower extremity is dampened.  Past Medical History:  Diagnosis Date  . Adenomatous colon polyp   . Allergy   . Angina at rest [facility])    chronic  . Anxiety   . Arthritis    RA  . Bell's palsy 2007  . Carotid artery occlusion   . Cataract    Dr. Dawna Part  . Coronary artery disease   . Depression   . Diabetes mellitus   . Diverticulosis   . Duodenitis   . DVT (deep venous thrombosis) (HCC)    in leg  . Dyspnea    DOE  . Gastropathy   . GERD (gastroesophageal reflux disease)   . Heart murmur   . Helicobacter pylori gastritis   . Hyperlipidemia   . Hypertension   . Kidney failure   . Mild aortic stenosis   . Neuropathy    . Obesity   . Peripheral vascular disease ([place])   . Post splenectomy syndrome   . Psoriasis   . Psoriatic arthritis ([place])   . Renal disorder    stage 4 kidney failure  . Sleep apnea    uses cpap  . SOB (shortness of breath)   . Stroke ([place])   . Tobacco use disorder    recently quit  . Tubular adenoma 08/2013   Dr. Hilarie Fredrickson  . Weak urinary stream     Past Surgical History:  Procedure Laterality Date  . CARDIAC CATHETERIZATION  08/2010   LAD: 80% ISR, RCA: 80% ostial, OM 80-90%  . CAROTID ENDARTERECTOMY  08/2010   left/ Dr. Kellie Simmering  . CATARACT EXTRACTION W/PHACO Left 07/09/2017   Procedure: CATARACT EXTRACTION PHACO AND INTRAOCULAR LENS PLACEMENT (IOC);  Surgeon: Birder Robson, MD;  Location: [facility] ORS;  Service: Ophthalmology;  Laterality: Left;  [place] 00:23 AP% 14.2 CDE 3.26 Fluid pack lot # [id number]  . CATARACT EXTRACTION W/PHACO Right 09/10/2017   Procedure: CATARACT EXTRACTION PHACO AND INTRAOCULAR LENS PLACEMENT (IOC);  Surgeon: Birder Robson, MD;  Location: [facility] ORS;  Service: Ophthalmology;  Laterality: Right;  [place] 00:26.4 AP% 17.3 CDE 4.59 Fluid Pack Lot # [id number]  . COLONOSCOPY    . CORONARY ANGIOPLASTY     LAD: before CABG  . CORONARY ARTERY BYPASS GRAFT  09/06/2010   At [facility]: LIMA to LAD, left radial to RCA, sequential SVG to OM3 and 4  . heart stents  Jan 2011   leg stents 06/2009 and 03/2010  . PTA of illiac and SFA  multiple   Dr. Ronalee Belts, s/p revision 11/2012  . SPLENECTOMY    . VASCULAR SURGERY     LEG STENTS    Social History   Socioeconomic History  . Marital status: Widowed    Spouse name: Not on file  . Number of children: 0  . Years of education: Not on file  . Highest education level: Not on file  Occupational History  . Occupation: Chief Operating Officer at [employer]: Disabled mostly due to neuropathy  Social Needs  . Financial resource strain: Not on file  . Food insecurity    Worry: Not on file    Inability: Not on file  .  Transportation needs    Medical: Not on file    Non-medical: Not on file  Tobacco Use  . Smoking status: Former Smoker    Packs/day: 0.75    Years: 40.00    Pack years: 30.00    Types: Cigarettes    Quit date: 04/15/2017    Years since quitting: 2.1  . Smokeless tobacco: Never Used  . Tobacco comment: 1 cigarette a day  Substance and Sexual Activity  . Alcohol use: No  . Drug use: No  . Sexual activity: Never  Lifestyle  . Physical activity    Days per week: Not on file    Minutes per session: Not on file  . Stress: Not on file  Relationships  . Social Herbalist on phone: Not on file    Gets together: Not on file    Attends religious service: Not on file    Active member of club or organization: Not on file    Attends meetings of clubs or organizations: Not on file    Relationship status: Not on file  . Intimate partner violence    Fear of current or ex partner: Not on file    Emotionally abused: Not on file    Physically abused: Not on file    Forced sexual activity: Not on file  Other Topics Concern  . Not on file  Social History Narrative   Wife died 01-18-2019      Has living will   Gearldine Shown and his wife Levada Dy should be his health care POA   Would accept resuscitation --but no prolonged ventilation   No tube feeds if cognitively unaware    Family History  Problem Relation Age of Onset  . Lung cancer Father 43  . Arthritis Father   . Breast cancer Sister 64  . Alcoholism Sister   . Dementia Mother   . Alzheimer's disease Mother   . Arthritis Mother   . Alcoholism Brother   . Epilepsy Sister   . Diabetes Paternal Grandfather   . Colon polyps Paternal Grandfather 38  . Alcoholism Sister   . Alcoholism Sister   . Alcoholism Brother     Allergies  Allergen Reactions  . Contrast Media [Iodinated Diagnostic Agents] Rash and Other (See Comments)    Got very hot and red   . Glipizide Other (See Comments)    ANTIDIABETICS. Burning  .  Metrizamide Other (See Comments)    Got very hot and red  . Penicillins Hives and Swelling    Has patient had a PCN  reaction causing immediate rash, facial/tongue/throat swelling, SOB or lightheadedness with hypotension: yes Has patient had a PCN reaction causing severe rash involving mucus membranes or skin necrosis: no  Has patient had a PCN reaction that required hospitalization: yes Has patient had a PCN reaction occurring within the last 10 years: no If all of the above answers are "NO", then may proceed with Cephalosporin use.      Review of Systems   Review of Systems: Negative Unless Checked Constitutional: [] Weight loss  [] Fever  [] Chills Cardiac: [] Chest pain   []  Atrial Fibrillation  [] Palpitations   [] Shortness of breath when laying flat   [] Shortness of breath with exertion. [] Shortness of breath at rest Vascular:  [] Pain in legs with walking   [] Pain in legs with standing [x] Pain in legs when laying flat   [x] Claudication    [] Pain in feet when laying flat    [] History of DVT   [] Phlebitis   [] Swelling in legs   [] Varicose veins   [] Non-healing ulcers Pulmonary:   [] Uses home oxygen   [] Productive cough   [] Hemoptysis   [] Wheeze  [] COPD   [] Asthma Neurologic:  [] Dizziness   [] Seizures  [] Blackouts [] History of stroke   [] History of TIA  [] Aphasia   [] Temporary Blindness   [] Weakness or numbness in arm   [] Weakness or numbness in leg Musculoskeletal:   [] Joint swelling   [] Joint pain   [] Low back pain  []  History of Knee Replacement [x] Arthritis [] back Surgeries  []  Spinal Stenosis    Hematologic:  [] Easy bruising  [] Easy bleeding   [] Hypercoagulable state   [] Anemic Gastrointestinal:  [] Diarrhea   [] Vomiting  [x] Gastroesophageal reflux/heartburn   [] Difficulty swallowing. [] Abdominal pain Genitourinary:  [] Chronic kidney disease   [] Difficult urination  [] Anuric   [] Blood in urine [] Frequent urination  [] Burning with urination   [] Hematuria Skin:  [] Rashes   [] Ulcers [] Wounds  Psychological:  [x] History of anxiety   [x]  History of major depression  []  Memory Difficulties      OBJECTIVE:   Physical Exam  BP (!) 145/82 (BP Location: Right Arm)   Pulse 80   Resp 16   Wt 230 lb (104.3 kg)   BMI 36.02 kg/m   Gen: WD/WN, NAD Head: [place]/AT, No temporalis wasting.  Ear/Nose/Throat: Hearing grossly intact, nares w/o erythema or drainage Eyes: PER, EOMI, sclera nonicteric.  Neck: Supple, no masses.  No JVD.  Pulmonary:  Good air movement, no use of accessory muscles.  Cardiac: RRR Vascular:  Vessel Right Left  Radial Palpable Palpable  Dorsalis Pedis Palpable Trace Palpable  Posterior Tibial Palpable Trace Palpable   Gastrointestinal: soft, non-distended. No guarding/no peritoneal signs.  Musculoskeletal: M/S 5/5 throughout.  No deformity or atrophy.  Neurologic: Pain and light touch intact in extremities.  Symmetrical.  Speech is fluent. Motor exam as listed above. Psychiatric: Judgment intact, Mood & affect appropriate for pt's clinical situation. Dermatologic: No Venous rashes. No Ulcers Noted.  No changes consistent with cellulitis. Lymph : No Cervical lymphadenopathy, no lichenification or skin changes of chronic lymphedema.       ASSESSMENT AND PLAN:  1. PVD (peripheral vascular disease) ([place]) Recommend:  The patient has experienced increased symptoms and is now describing lifestyle limiting claudication and mild rest pain.   Given the severity of the patient's left lower extremity symptoms the patient should undergo angiography and intervention.  Risk and benefits were reviewed the patient.  Indications for the procedure were reviewed.  All questions were answered, the patient agrees to proceed.  The patient should continue walking and begin a more formal exercise program.  The patient should continue antiplatelet therapy and aggressive treatment of the lipid abnormalities  The patient will follow up with me after the angiogram.   2.  Gastroesophageal reflux disease with esophagitis without hemorrhage Continue PPI as already ordered, this medication has been reviewed and there are no changes at this time.  Avoidence of caffeine and alcohol  Moderate elevation of the head of the bed   3. Hyperlipidemia, mixed Continue statin as ordered and reviewed, no changes at this time    Current Outpatient Medications on File Prior to Visit  Medication Sig Dispense Refill  . ACCU-CHEK FASTCLIX LANCETS MISC Use to check sugar 1 time daily. 100 each 5  . albuterol (PROAIR HFA) 108 (90 Base) MCG/ACT inhaler Inhale 2 puffs into the lungs every 6 (six) hours as needed for shortness of breath. 1 Inhaler 3  . aspirin 81 MG tablet Take 81 mg by mouth daily.    [name] buPROPion (WELLBUTRIN XL) 300 MG 24 hr tablet Take 1 tablet (300 mg total) by mouth daily. 90 tablet 3  . busPIRone (BUSPAR) 10 MG tablet TAKE 2 TABLETS(20 MG) BY MOUTH TWICE DAILY 360 tablet 3  . calcitRIOL (ROCALTROL) 0.25 MCG capsule Take 0.25 mcg by mouth 3 (three) times a week.    . carvedilol (COREG) 12.5 MG tablet Take 1 tablet (12.5 mg total) by mouth 2 (two) times daily. 60 tablet 0  . Cholecalciferol (VITAMIN D PO) Take 1 capsule by mouth daily.     . clopidogrel (PLAVIX) 75 MG tablet TAKE 1 TABLET BY MOUTH EVERY DAY 90 tablet 0  . CONTOUR TEST test strip TEST EVERY DAY 100 strip 11  . DULoxetine (CYMBALTA) 60 MG capsule Take 1 capsule (60 mg total) by mouth 2 (two) times daily. 180 capsule 3  . famotidine (PEPCID) 40 MG tablet Take 1 tablet (40 mg total) by mouth 2 (two) times daily. 180 tablet 3  . finasteride (PROSCAR) 5 MG tablet Take 5 mg by mouth daily.    . furosemide (LASIX) 20 MG tablet Take 20 mg by mouth daily.    . Glucosamine-Chondroit-Vit C-Mn (GLUCOSAMINE CHONDROITIN COMPLX) CAPS Take by mouth.    Vanessa Kick Ethyl (VASCEPA) 1 g CAPS Take 2 capsules (2 g total) by mouth daily. 120 capsule 5  . insulin NPH Human (NOVOLIN N RELION) 100 UNIT/ML injection  Inject 0.2 mLs (20 Units total) into the skin at bedtime. 10 mL 11  . insulin regular (NOVOLIN R) 100 units/mL injection Inject 20 Units into the skin 3 (three) times daily before meals.    [name] ketoconazole (NIZORAL) 2 % cream Apply topically 2 (two) times daily. For rash 15 g 0  . losartan (COZAAR) 100 MG tablet TAKE 1 TABLET(100 MG) BY MOUTH DAILY 30 tablet 2  . MAGNESIUM PO Take 400 mg by mouth daily.     . Multiple Vitamin (MULTIVITAMIN) capsule Take by mouth.    . nitroGLYCERIN (NITROSTAT) 0.4 MG SL tablet Place 1 tablet (0.4 mg total) under the tongue every 5 (five) minutes as needed. 25 tablet 1  . RELION INSULIN SYRINGE 31G X 15/64" 1 ML MISC ONE INJECTION THREE TIMES DAILY 200 each 2  . sertraline (ZOLOFT) 100 MG tablet Take 2 tablets (200 mg total) by mouth daily. 180 tablet 3  . simvastatin (ZOCOR) 20 MG tablet TAKE 1 TABLET BY MOUTH ONCE DAILY AT 6PM 90 tablet 3  . tamsulosin (FLOMAX) 0.4  MG CAPS capsule take 2 capsule by mouth daily 180 capsule 3  . cyclobenzaprine (FLEXERIL) 5 MG tablet Take 1-2 tablets (5-10 mg total) by mouth at bedtime as needed for muscle spasms. (Patient not taking: Reported on 05/27/2019) 30 tablet 0  . doxycycline (VIBRA-TABS) 100 MG tablet Take 1 tablet (100 mg total) by mouth 2 (two) times daily. (Patient not taking: Reported on 05/27/2019) 20 tablet 0  . HYDROcodone-acetaminophen (NORCO/VICODIN) 5-325 MG tablet 1-2 tabs po bid prn (Patient not taking: Reported on 05/27/2019) 6 tablet 0   No current facility-administered medications on file prior to visit.     There are no Patient Instructions on file for this visit. No follow-ups on file.   Kris Hartmann, NP  This note was completed with Sales executive.  Any errors are purely unintentional.

## 2019-06-01 ENCOUNTER — Other Ambulatory Visit: Payer: Self-pay | Admitting: *Deleted

## 2019-06-01 MED ORDER — SIMVASTATIN 20 MG PO TABS: 20.0000 mg | ORAL_TABLET | Freq: Every day | ORAL | 0 refills | Status: DC

## 2019-06-03 ENCOUNTER — Ambulatory Visit: Payer: Medicare Other | Admitting: Psychology

## 2019-06-05 ENCOUNTER — Other Ambulatory Visit: Payer: Medicare Other

## 2019-06-05 ENCOUNTER — Other Ambulatory Visit: Admission: RE | Admit: 2019-06-05 | Payer: Medicare Other | Source: Ambulatory Visit

## 2019-06-05 NOTE — Progress Notes (Signed)
Message left for patient- pt did not show for covid testing appt- instructed to come for covid test Monday Jun 08, 2019 pt instructed to come between 10:30-12noon.

## 2019-06-08 ENCOUNTER — Other Ambulatory Visit
Admission: RE | Admit: 2019-06-08 | Discharge: 2019-06-08 | Disposition: A | Discharge status: Home / Self Care | Payer: Medicare Other | Source: Ambulatory Visit | Attending: Vascular Surgery | Admitting: Vascular Surgery

## 2019-06-08 ENCOUNTER — Other Ambulatory Visit (INDEPENDENT_AMBULATORY_CARE_PROVIDER_SITE_OTHER): Payer: Self-pay | Admitting: Nurse Practitioner

## 2019-06-08 ENCOUNTER — Other Ambulatory Visit: Payer: Self-pay

## 2019-06-08 DIAGNOSIS — Z01812 Encounter for preprocedural laboratory examination: Secondary | ICD-10-CM | POA: Diagnosis not present

## 2019-06-08 DIAGNOSIS — Z20828 Contact with and (suspected) exposure to other viral communicable diseases: Secondary | ICD-10-CM | POA: Insufficient documentation

## 2019-06-08 LAB — SARS CORONAVIRUS 2 (TAT 6-24 HRS): SARS Coronavirus 2: NEGATIVE

## 2019-06-09 ENCOUNTER — Ambulatory Visit: Payer: Medicare Other | Admitting: Internal Medicine

## 2019-06-09 ENCOUNTER — Ambulatory Visit
Admission: RE | Admit: 2019-06-09 | Discharge: 2019-06-09 | Disposition: A | Discharge status: Home / Self Care | Payer: Medicare Other | Attending: Vascular Surgery | Admitting: Vascular Surgery

## 2019-06-09 ENCOUNTER — Encounter
Admission: RE | Disposition: A | Discharge status: Home / Self Care | Payer: Self-pay | Source: Home / Self Care | Attending: Vascular Surgery

## 2019-06-09 ENCOUNTER — Other Ambulatory Visit: Payer: Self-pay

## 2019-06-09 DIAGNOSIS — Z87891 Personal history of nicotine dependence: Secondary | ICD-10-CM | POA: Insufficient documentation

## 2019-06-09 DIAGNOSIS — I25119 Atherosclerotic heart disease of native coronary artery with unspecified angina pectoris: Secondary | ICD-10-CM | POA: Diagnosis not present

## 2019-06-09 DIAGNOSIS — I129 Hypertensive chronic kidney disease with stage 1 through stage 4 chronic kidney disease, or unspecified chronic kidney disease: Secondary | ICD-10-CM | POA: Insufficient documentation

## 2019-06-09 DIAGNOSIS — G4730 Sleep apnea, unspecified: Secondary | ICD-10-CM | POA: Diagnosis not present

## 2019-06-09 DIAGNOSIS — Z8673 Personal history of transient ischemic attack (TIA), and cerebral infarction without residual deficits: Secondary | ICD-10-CM | POA: Diagnosis not present

## 2019-06-09 DIAGNOSIS — I70222 Atherosclerosis of native arteries of extremities with rest pain, left leg: Secondary | ICD-10-CM | POA: Diagnosis not present

## 2019-06-09 DIAGNOSIS — Z7982 Long term (current) use of aspirin: Secondary | ICD-10-CM | POA: Insufficient documentation

## 2019-06-09 DIAGNOSIS — N184 Chronic kidney disease, stage 4 (severe): Secondary | ICD-10-CM | POA: Insufficient documentation

## 2019-06-09 DIAGNOSIS — I70221 Atherosclerosis of native arteries of extremities with rest pain, right leg: Secondary | ICD-10-CM

## 2019-06-09 DIAGNOSIS — Z7902 Long term (current) use of antithrombotics/antiplatelets: Secondary | ICD-10-CM | POA: Diagnosis not present

## 2019-06-09 DIAGNOSIS — K219 Gastro-esophageal reflux disease without esophagitis: Secondary | ICD-10-CM | POA: Insufficient documentation

## 2019-06-09 DIAGNOSIS — M069 Rheumatoid arthritis, unspecified: Secondary | ICD-10-CM | POA: Insufficient documentation

## 2019-06-09 DIAGNOSIS — E782 Mixed hyperlipidemia: Secondary | ICD-10-CM | POA: Insufficient documentation

## 2019-06-09 DIAGNOSIS — Z86718 Personal history of other venous thrombosis and embolism: Secondary | ICD-10-CM | POA: Insufficient documentation

## 2019-06-09 DIAGNOSIS — F419 Anxiety disorder, unspecified: Secondary | ICD-10-CM | POA: Insufficient documentation

## 2019-06-09 DIAGNOSIS — Z79899 Other long term (current) drug therapy: Secondary | ICD-10-CM | POA: Insufficient documentation

## 2019-06-09 DIAGNOSIS — F329 Major depressive disorder, single episode, unspecified: Secondary | ICD-10-CM | POA: Insufficient documentation

## 2019-06-09 DIAGNOSIS — E1151 Type 2 diabetes mellitus with diabetic peripheral angiopathy without gangrene: Secondary | ICD-10-CM | POA: Diagnosis not present

## 2019-06-09 DIAGNOSIS — I70219 Atherosclerosis of native arteries of extremities with intermittent claudication, unspecified extremity: Secondary | ICD-10-CM

## 2019-06-09 HISTORY — PX: LOWER EXTREMITY ANGIOGRAPHY: CATH118251

## 2019-06-09 LAB — CREATININE, SERUM
Creatinine, Ser: 2.76 mg/dL — ABNORMAL HIGH (ref 0.61–1.24)
GFR calc Af Amer: 27 mL/min — ABNORMAL LOW (ref >60)
GFR calc non Af Amer: 23 mL/min — ABNORMAL LOW (ref >60)

## 2019-06-09 LAB — GLUCOSE, CAPILLARY
Glucose-Capillary: 132 mg/dL — ABNORMAL HIGH (ref 70–99)
Glucose-Capillary: 159 mg/dL — ABNORMAL HIGH (ref 70–99)

## 2019-06-09 LAB — BUN: BUN: 45 mg/dL — ABNORMAL HIGH (ref 8–23)

## 2019-06-09 SURGERY — LOWER EXTREMITY ANGIOGRAPHY
Anesthesia: Moderate Sedation | Site: Leg Lower | Laterality: Left

## 2019-06-09 MED ORDER — ACETAMINOPHEN 325 MG PO TABS: 650.0000 mg | ORAL_TABLET | ORAL | Status: DC | PRN

## 2019-06-09 MED ORDER — ONDANSETRON HCL 4 MG/2ML IJ SOLN: 4.0000 mg | Freq: Four times a day (QID) | INTRAMUSCULAR | Status: DC | PRN

## 2019-06-09 MED ORDER — SODIUM CHLORIDE 0.9 % IV SOLN
INTRAVENOUS | Status: DC
  Administered 2019-06-09: 10:00:00 via INTRAVENOUS

## 2019-06-09 MED ORDER — HYDRALAZINE HCL 20 MG/ML IJ SOLN: 5.0000 mg | INTRAMUSCULAR | Status: DC | PRN

## 2019-06-09 MED ORDER — LABETALOL HCL 5 MG/ML IV SOLN: 10.0000 mg | INTRAVENOUS | Status: DC | PRN

## 2019-06-09 MED ORDER — LABETALOL HCL 5 MG/ML IV SOLN
INTRAVENOUS | Status: DC | PRN
  Administered 2019-06-09: 10 mg via INTRAVENOUS

## 2019-06-09 MED ORDER — FAMOTIDINE 20 MG PO TABS
40.0000 mg | ORAL_TABLET | Freq: Once | ORAL | Status: AC | PRN
  Administered 2019-06-09: 13:00:00 40 mg via ORAL

## 2019-06-09 MED ORDER — MIDAZOLAM HCL 5 MG/5ML IJ SOLN
INTRAMUSCULAR | Status: AC
  Filled 2019-06-09: qty 5

## 2019-06-09 MED ORDER — MORPHINE SULFATE (PF) 4 MG/ML IV SOLN: 2.0000 mg | INTRAVENOUS | Status: DC | PRN

## 2019-06-09 MED ORDER — CLOPIDOGREL BISULFATE 75 MG PO TABS: 150.0000 mg | ORAL_TABLET | Freq: Every day | ORAL | Status: DC

## 2019-06-09 MED ORDER — SODIUM CHLORIDE 0.9% FLUSH: 3.0000 mL | Freq: Two times a day (BID) | INTRAVENOUS | Status: DC

## 2019-06-09 MED ORDER — HEPARIN SODIUM (PORCINE) 1000 UNIT/ML IJ SOLN
INTRAMUSCULAR | Status: DC | PRN
  Administered 2019-06-09: 5000 [IU] via INTRAVENOUS

## 2019-06-09 MED ORDER — SODIUM CHLORIDE 0.9 % IV SOLN: INTRAVENOUS | Status: DC

## 2019-06-09 MED ORDER — HYDRALAZINE HCL 20 MG/ML IJ SOLN
INTRAMUSCULAR | Status: AC
  Filled 2019-06-09: qty 1

## 2019-06-09 MED ORDER — HYDRALAZINE HCL 20 MG/ML IJ SOLN
INTRAMUSCULAR | Status: DC | PRN
  Administered 2019-06-09: 10 mg via INTRAVENOUS

## 2019-06-09 MED ORDER — CLINDAMYCIN PHOSPHATE 300 MG/50ML IV SOLN
INTRAVENOUS | Status: AC
  Administered 2019-06-09: 300 mg via INTRAVENOUS
  Filled 2019-06-09: qty 50

## 2019-06-09 MED ORDER — SODIUM CHLORIDE 0.9% FLUSH: 3.0000 mL | INTRAVENOUS | Status: DC | PRN

## 2019-06-09 MED ORDER — METHYLPREDNISOLONE SODIUM SUCC 125 MG IJ SOLR
INTRAMUSCULAR | Status: AC
  Administered 2019-06-09: 125 mg via INTRAVENOUS
  Filled 2019-06-09: qty 2

## 2019-06-09 MED ORDER — OXYCODONE HCL 5 MG PO TABS: 5.0000 mg | ORAL_TABLET | ORAL | Status: DC | PRN

## 2019-06-09 MED ORDER — MIDAZOLAM HCL 2 MG/2ML IJ SOLN
INTRAMUSCULAR | Status: DC | PRN
  Administered 2019-06-09: 2 mg via INTRAVENOUS
  Administered 2019-06-09 (×3): 1 mg via INTRAVENOUS

## 2019-06-09 MED ORDER — DIPHENHYDRAMINE HCL 50 MG/ML IJ SOLN
INTRAMUSCULAR | Status: AC
  Administered 2019-06-09: 50 mg via INTRAVENOUS
  Filled 2019-06-09: qty 1

## 2019-06-09 MED ORDER — FENTANYL CITRATE (PF) 100 MCG/2ML IJ SOLN
INTRAMUSCULAR | Status: AC
  Filled 2019-06-09: qty 2

## 2019-06-09 MED ORDER — ONDANSETRON HCL 4 MG/2ML IJ SOLN
INTRAMUSCULAR | Status: AC
  Filled 2019-06-09: qty 2

## 2019-06-09 MED ORDER — METHYLPREDNISOLONE SODIUM SUCC 125 MG IJ SOLR
125.0000 mg | Freq: Once | INTRAMUSCULAR | Status: AC | PRN
  Administered 2019-06-09: 13:00:00 125 mg via INTRAVENOUS

## 2019-06-09 MED ORDER — MIDAZOLAM HCL 2 MG/ML PO SYRP: 8.0000 mg | ORAL_SOLUTION | Freq: Once | ORAL | Status: DC | PRN

## 2019-06-09 MED ORDER — FENTANYL CITRATE (PF) 100 MCG/2ML IJ SOLN
INTRAMUSCULAR | Status: DC | PRN
  Administered 2019-06-09: 25 ug via INTRAVENOUS
  Administered 2019-06-09 (×2): 50 ug via INTRAVENOUS
  Administered 2019-06-09: 25 ug via INTRAVENOUS

## 2019-06-09 MED ORDER — DIPHENHYDRAMINE HCL 50 MG/ML IJ SOLN
50.0000 mg | Freq: Once | INTRAMUSCULAR | Status: AC | PRN
  Administered 2019-06-09: 13:00:00 50 mg via INTRAVENOUS

## 2019-06-09 MED ORDER — ONDANSETRON HCL 4 MG/2ML IJ SOLN
4.0000 mg | Freq: Four times a day (QID) | INTRAMUSCULAR | Status: DC | PRN
  Administered 2019-06-09: 12:00:00 4 mg via INTRAVENOUS

## 2019-06-09 MED ORDER — IODIXANOL 320 MG/ML IV SOLN
INTRAVENOUS | Status: DC | PRN
  Administered 2019-06-09: 145 mL via INTRA_ARTERIAL

## 2019-06-09 MED ORDER — MIDAZOLAM HCL 2 MG/2ML IJ SOLN
INTRAMUSCULAR | Status: AC
  Filled 2019-06-09: qty 2

## 2019-06-09 MED ORDER — HEPARIN SODIUM (PORCINE) 1000 UNIT/ML IJ SOLN
INTRAMUSCULAR | Status: AC
  Filled 2019-06-09: qty 1

## 2019-06-09 MED ORDER — CLINDAMYCIN PHOSPHATE 300 MG/50ML IV SOLN
300.0000 mg | Freq: Once | INTRAVENOUS | Status: AC
  Administered 2019-06-09: 13:00:00 300 mg via INTRAVENOUS

## 2019-06-09 MED ORDER — LABETALOL HCL 5 MG/ML IV SOLN
INTRAVENOUS | Status: AC
  Filled 2019-06-09: qty 4

## 2019-06-09 MED ORDER — HYDROMORPHONE HCL 1 MG/ML IJ SOLN: 1.0000 mg | Freq: Once | INTRAMUSCULAR | Status: DC | PRN

## 2019-06-09 MED ORDER — FAMOTIDINE 20 MG PO TABS
ORAL_TABLET | ORAL | Status: AC
  Administered 2019-06-09: 13:00:00 40 mg via ORAL
  Filled 2019-06-09: qty 2

## 2019-06-09 MED ORDER — SODIUM CHLORIDE 0.9 % IV SOLN: 250.0000 mL | INTRAVENOUS | Status: DC | PRN

## 2019-06-09 SURGICAL SUPPLY — 29 items
BALLN DORADO 6X200X135 (BALLOONS) ×3
BALLN DORADO 7X40X80 (BALLOONS) ×3
BALLN LUTONIX 6X220X130 (BALLOONS) ×3
BALLN MUSTANG 7X60X135 (BALLOONS) ×3
BALLOON DORADO 6X200X135 (BALLOONS) ×1 IMPLANT
BALLOON DORADO 7X40X80 (BALLOONS) ×1 IMPLANT
BALLOON LUTONIX 6X220X130 (BALLOONS) ×1 IMPLANT
BALLOON MUSTANG 7X60X135 (BALLOONS) ×1 IMPLANT
CANNULA 5F STIFF (CANNULA) ×3 IMPLANT
CATH BEACON 5 .038 100 VERT TP (CATHETERS) ×3 IMPLANT
CATH PIG 70CM (CATHETERS) ×3 IMPLANT
COVER PROBE U/S 5X48 (MISCELLANEOUS) ×3 IMPLANT
DEVICE PRESTO INFLATION (MISCELLANEOUS) ×6 IMPLANT
DEVICE STARCLOSE SE CLOSURE (Vascular Products) ×6 IMPLANT
GLIDEWIRE ADV .035X260CM (WIRE) ×3 IMPLANT
GUIDEWIRE SUPER STIFF .035X180 (WIRE) ×6 IMPLANT
INTRODUCER 7FR 23CM (INTRODUCER) ×6 IMPLANT
LIFESTENT SOLO 7X200X135 (Permanent Stent) ×3 IMPLANT
NEEDLE ENTRY 21GA 7CM ECHOTIP (NEEDLE) ×12 IMPLANT
PACK ANGIOGRAPHY (CUSTOM PROCEDURE TRAY) ×3 IMPLANT
SET INTRO CAPELLA COAXIAL (SET/KITS/TRAYS/PACK) ×9 IMPLANT
SHEATH ANL2 6FRX45 HC (SHEATH) ×3 IMPLANT
SHEATH BRITE TIP 5FRX11 (SHEATH) ×3 IMPLANT
STENT LIFESTENT 7X150X130 (Permanent Stent) ×3 IMPLANT
STENT LIFESTREAM 8X26X80 (Permanent Stent) ×3 IMPLANT
STENT LIFESTREAM 8X37X80 (Permanent Stent) ×3 IMPLANT
SYR MEDRAD MARK 7 150ML (SYRINGE) ×3 IMPLANT
TUBING CONTRAST HIGH PRESS 72 (TUBING) ×3 IMPLANT
WIRE J 3MM .035X145CM (WIRE) ×3 IMPLANT

## 2019-06-09 NOTE — Progress Notes (Signed)
BUN/CRE: resulted now. Called MD. New orders received. NS to 125 ml/hr. Now.

## 2019-06-09 NOTE — Progress Notes (Signed)
Pt. C/o Nausea: med. With Zofran 4 mg IV now per prn orders.

## 2019-06-09 NOTE — Discharge Instructions (Signed)
Femoral Site Care °This sheet gives you information about how to care for yourself after your procedure. Your health care provider may also give you more specific instructions. If you have problems or questions, contact your health care provider. °What can I expect after the procedure? °After the procedure, it is common to have: °· Bruising that usually fades within 1-2 weeks. °· Tenderness at the site. °Follow these instructions at home: °Wound care °· Follow instructions from your health care provider about how to take care of your insertion site. Make sure you: °? Wash your hands with soap and water before you change your bandage (dressing). If soap and water are not available, use hand sanitizer. °? Change your dressing as told by your health care provider. °? Leave stitches (sutures), skin glue, or adhesive strips in place. These skin closures may need to stay in place for 2 weeks or longer. If adhesive strip edges start to loosen and curl up, you may trim the loose edges. Do not remove adhesive strips completely unless your health care provider tells you to do that. °· Do not take baths, swim, or use a hot tub until your health care provider approves. °· You may shower 24-48 hours after the procedure or as told by your health care provider. °? Gently wash the site with plain soap and water. °? Pat the area dry with a clean towel. °? Do not rub the site. This may cause bleeding. °· Do not apply powder or lotion to the site. Keep the site clean and dry. °· Check your femoral site every day for signs of infection. Check for: °? Redness, swelling, or pain. °? Fluid or blood. °? Warmth. °? Pus or a bad smell. °Activity °· For the first 2-3 days after your procedure, or as long as directed: °? Avoid climbing stairs as much as possible. °? Do not squat. °· Do not lift anything that is heavier than 10 lb (4.5 kg), or the limit that you are told, until your health care provider says that it is safe. °· Rest as  directed. °? Avoid sitting for a long time without moving. Get up to take short walks every 1-2 hours. °· Do not drive for 24 hours if you were given a medicine to help you relax (sedative). °General instructions °· Take over-the-counter and prescription medicines only as told by your health care provider. °· Keep all follow-up visits as told by your health care provider. This is important. °Contact a health care provider if you have: °· A fever or chills. °· You have redness, swelling, or pain around your insertion site. °Get help right away if: °· The catheter insertion area swells very fast. °· You pass out. °· You suddenly start to sweat or your skin gets clammy. °· The catheter insertion area is bleeding, and the bleeding does not stop when you hold steady pressure on the area. °· The area near or just beyond the catheter insertion site becomes pale, cool, tingly, or numb. °These symptoms may represent a serious problem that is an emergency. Do not wait to see if the symptoms will go away. Get medical help right away. Call your local emergency services (911 in the [place] Do not drive yourself to the hospital. °Summary °· After the procedure, it is common to have bruising that usually fades within 1-2 weeks. °· Check your femoral site every day for signs of infection. °· Do not lift anything that is heavier than 10 lb (4.5 kg), or the   limit that you are told, until your health care provider says that it is safe. This information is not intended to replace advice given to you by your health care provider. Make sure you discuss any questions you have with your health care provider. Document Released: 03/12/2014 Document Revised: 07/22/2017 Document Reviewed: 07/22/2017 Elsevier Patient Education  2020 [employer] After This sheet gives you information about how to care for yourself after your procedure. Your doctor may also give you more specific instructions. If you have problems or  questions, contact your doctor. Follow these instructions at home: Insertion site care  Follow instructions from your doctor about how to take care of your long, thin tube (catheter) insertion area. Make sure you: ? Wash your hands with soap and water before you change your bandage (dressing). If you cannot use soap and water, use hand sanitizer. ? Change your bandage as told by your doctor. ? Leave stitches (sutures), skin glue, or skin tape (adhesive) strips in place. They may need to stay in place for 2 weeks or longer. If tape strips get loose and curl up, you may trim the loose edges. Do not remove tape strips completely unless your doctor says it is okay.  Do not take baths, swim, or use a hot tub until your doctor says it is okay.  You may shower 24-48 hours after the procedure or as told by your doctor. ? Gently wash the area with plain soap and water. ? Pat the area dry with a clean towel. ? Do not rub the area. This may cause bleeding.  Do not apply powder or lotion to the area. Keep the area clean and dry.  Check your insertion area every day for signs of infection. Check for: ? More redness, swelling, or pain. ? Fluid or blood. ? Warmth. ? Pus or a bad smell. Activity  Rest as told by your doctor, usually for 1-2 days.  Do not lift anything that is heavier than 10 lbs. (4.5 kg) or as told by your doctor.  Do not drive for 24 hours if you were given a medicine to help you relax (sedative).  Do not drive or use heavy machinery while taking prescription pain medicine. General instructions   Go back to your normal activities as told by your doctor, usually in about a week. Ask your doctor what activities are safe for you.  If the insertion area starts to bleed, lie flat and put pressure on the area. If the bleeding does not stop, get help right away. This is an emergency.  Drink enough fluid to keep your pee (urine) clear or pale yellow.  Take over-the-counter and  prescription medicines only as told by your doctor.  Keep all follow-up visits as told by your doctor. This is important. Contact a doctor if:  You have a fever.  You have chills.  You have more redness, swelling, or pain around your insertion area.  You have fluid or blood coming from your insertion area.  The insertion area feels warm to the touch.  You have pus or a bad smell coming from your insertion area.  You have more bruising around the insertion area.  Blood collects in the tissue around the insertion area (hematoma) that may be painful to the touch. Get help right away if:  You have a lot of pain in the insertion area.  The insertion area swells very fast.  The insertion area is bleeding, and the bleeding  does not stop after holding steady pressure on the area.  The area near or just beyond the insertion area becomes pale, cool, tingly, or numb. These symptoms may be an emergency. Do not wait to see if the symptoms will go away. Get medical help right away. Call your local emergency services (911 in the [place] Do not drive yourself to the hospital. Summary  After the procedure, it is common to have bruising and tenderness at the long, thin tube insertion area.  After the procedure, it is important to rest and drink plenty of fluids.  Do not take baths, swim, or use a hot tub until your doctor says it is okay to do so. You may shower 24-48 hours after the procedure or as told by your doctor.  If the insertion area starts to bleed, lie flat and put pressure on the area. If the bleeding does not stop, get help right away. This is an emergency. This information is not intended to replace advice given to you by your health care provider. Make sure you discuss any questions you have with your health care provider. Document Released: 10/05/2008 Document Revised: 06/21/2017 Document Reviewed: 07/03/2016 Elsevier Patient Education  [place].    Moderate  Conscious Sedation, Adult, Care After These instructions provide you with information about caring for yourself after your procedure. Your health care provider may also give you more specific instructions. Your treatment has been planned according to current medical practices, but problems sometimes occur. Call your health care provider if you have any problems or questions after your procedure. What can I expect after the procedure? After your procedure, it is common:  To feel sleepy for several hours.  To feel clumsy and have poor balance for several hours.  To have poor judgment for several hours.  To vomit if you eat too soon. Follow these instructions at home: For at least 24 hours after the procedure:   Do not: ? Participate in activities where you could fall or become injured. ? Drive. ? Use heavy machinery. ? Drink alcohol. ? Take sleeping pills or medicines that cause drowsiness. ? Make important decisions or sign legal documents. ? Take care of children on your own.  Rest. Eating and drinking  Follow the diet recommended by your health care provider.  If you vomit: ? Drink water, juice, or soup when you can drink without vomiting. ? Make sure you have little or no nausea before eating solid foods. General instructions  Have a responsible adult stay with you until you are awake and alert.  Take over-the-counter and prescription medicines only as told by your health care provider.  If you smoke, do not smoke without supervision.  Keep all follow-up visits as told by your health care provider. This is important. Contact a health care provider if:  You keep feeling nauseous or you keep vomiting.  You feel light-headed.  You develop a rash.  You have a fever. Get help right away if:  You have trouble breathing. This information is not intended to replace advice given to you by your health care provider. Make sure you discuss any questions you have with your  health care provider. Document Released: 04/29/2013 Document Revised: 06/21/2017 Document Reviewed: 10/29/2015 Elsevier Patient Education  2020 [employer].

## 2019-06-09 NOTE — H&P (Signed)
[facility] History & Physical Update  The patient was interviewed and re-examined.  The patient's previous History and Physical has been reviewed and is unchanged.  There is no change in the plan of care. We plan to proceed with the scheduled procedure.  Hortencia Pilar, MD  06/09/2019, 10:19 AM

## 2019-06-09 NOTE — Op Note (Signed)
[facility] Percutaneous Study/Intervention Procedural Note   Date of Surgery: 06/09/2019  Surgeon:  Katha Cabal, MD.  Pre-operative Diagnosis: Atherosclerotic occlusive disease bilateral lower extremities with rest pain of the left lower extremity  Post-operative diagnosis: Same  Procedure(s) Performed: 1. Introduction catheter into left lower extremity 3rd order catheter placement  2. Contrast injection left lower extremity for distal runoff   3. Percutaneous transluminal angioplasty and stent placement left superficial femoral artery and popliteal 4. Percutaneous transluminal angioplasty and stent placement left common iliac artery with a 8 mm x 38 mm lifestream stent, kissing balloon technique             5.  Percutaneous transluminal angioplasty and stent placement right common iliac artery with an 8 mm x 26 mm lifestream stent, kissing balloon technique             6.  Star close closure bilateral common femoral arteriotomy  Anesthesia: Conscious sedation was administered under my direct supervision by the interventional radiology RN. IV Versed plus fentanyl were utilized. Continuous ECG, pulse oximetry and blood pressure was monitored throughout the entire procedure.  Conscious sedation was for a total of 130 minutes.  Sheath: SFA intervention performed through a 6 [place] Ansell sheath right common femoral retrograde; kissing common iliac stents performed through 7 French 23 cm Pinnacle sheaths bilateral femoral arteries retrograde  Contrast: 145 cc  Fluoroscopy Time: 20.1 minutes  Indications: [name] [name] presents with increasing pain in his left lower extremity.  He has known atherosclerotic occlusive disease and has had multiple interventions.  Noninvasive studies as well as physical examination show a focal 95% stenosis of the left SFA within one of the pre-existing stents.  The risks  and benefits are reviewed all questions answered patient agrees to proceed.  Procedure: [name] [name] is a 63 y.o. y.o. male who was identified and appropriate procedural time out was performed. The patient was then placed supine on the table and prepped and draped in the usual sterile fashion.   Ultrasound was placed in the sterile sleeve and the right groin was evaluated the right common femoral artery was echolucent and pulsatile indicating patency.  Image was recorded for the permanent record and under real-time visualization a microneedle was inserted into the common femoral artery microwire followed by a micro-sheath.  A J-wire was then advanced through the micro-sheath and a  5 [place] sheath was then inserted over a J-wire. J-wire was then advanced and a 5 French pigtail catheter was positioned at the level of T12. AP projection of the aorta was then obtained. Pigtail catheter was repositioned to above the bifurcation and a RAO view of the pelvis was obtained.  Subsequently a pigtail catheter with the stiff angle Glidewire was used to cross the aortic bifurcation the catheter wire were advanced down into the left distal external iliac artery. Oblique view of the femoral bifurcation was then obtained and subsequently the wire was reintroduced and the pigtail catheter negotiated into the SFA representing third order catheter placement.  Left distal runoff was then performed.  Diagnostic interpretation: The abdominal aorta is opacified with a bolus injection of contrast.  Is free of hemodynamically significant stenoses.  The renal arteries are noted there does not appear to be any hemodynamically significant renal artery stenosis at the origins.  There is a greater than 85% stenosis at the origin of the left common iliac with marked poststenotic dilatation.  The previously placed stent within the distal common  and proximal two thirds of the external iliac artery is widely patent.  There is filling  of the left internal iliac artery but it is on the right there is a 50 to 60% stenosis within the proximal right common iliac.  The external iliac arteries are patent bilaterally and free of hemodynamically significant stenosis.  The common femoral demonstrates moderate disease.  The profunda femoris is heavily diseased with greater than 70% lesions noted at its origin as well as in its proximal few centimeters.  The SFA demonstrates diffuse disease from its origin extending down to the popliteal.  There are numerous greater than 90% stenoses.  The origin of the SFA demonstrates a greater than 70% stenosis.  The previously placed stent within the SFA is diffusely involved with this recurrent disease.  The popliteal remains patent although somewhat small the superior stent that was placed in the mid and distal popliteal remains widely patent.  There is single-vessel runoff via the peroneal to the foot.  Anterior tibial posterior tibial are occluded at their origins arraignment main occluded throughout their entire length.  A 6 French Ansell sheath was advanced up and over the bifurcation and positioned in the femoral artery  Given the finding within the common iliac arteries kissing stents will be required.  This will be most easily treated by completing the left lower extremity SFA intervention and then proceeding with iliac stenting.  However the patient's body habitus makes femoral access exceedingly difficult and therefore I elected to obtain left common femoral access after having advanced the Ansell sheath up and over and positioned it in the distal left external iliac.  The wire extending into the SFA helped serve as an identifier for the common femoral artery.  Ultrasound was reprepped and draped and using combination of ultrasound guidance as well as fluoroscopic guidance a micro needle was inserted into the left common femoral.  This is also made more difficult by the fact that his left common  femoral is quite short and he has a very high femoral bifurcation micro needle was inserted and the wire advanced under fluoroscopy.  Stiffened micropuncture sheath was then advanced and an Amplatz wire was advanced through the micro sheath.  5000 units of heparin was given later in the case the micro sheath was upsized to a 7 French 23 cm Pinnacle sheath for the iliac intervention.   KMP  catheter and s advantage wire were then negotiated down into the distal popliteal.  Initially a 5 mm balloon was used to angioplasty the superficial femoral from Hunter's canal proximally. Inflations were to 16-18 atmospheres for 1 minute. Follow-up imaging demonstrated minimal improvement with multiple areas of greater than 70% residual stenosis including at the origin.  Of note angioplasty of the SFA origin appears to have pushed plaque into the diseased profunda femoris and this now appears occluded.  Giving the extensive residual stenosis I elected to restent the SFA beginning at Hunter's canal with 7 mm life stents.  I extended these all the way up to the origin of the SFA.  They were postdilated with 6 mm Lutonix drug-eluting balloons but even with predilatation of 5 mm and inflating the Lutonix balloons to 14 atm there were multiple areas of greater than 60% residual.  Therefore I utilized a 7 mm x 40 mm [place] balloon and individually treated for sites with inflations ranging from 24 atm to 36 atm inflations were for 30 seconds.  Follow-up imaging now demonstrated wide patency of the SFA with  filling of the profundus and preservation of the single-vessel runoff.  I now move forward with the iliac intervention and as noted above the 7 French sheath was inserted on the left.  A similar 7 French 23 cm Pinnacle sheath was now exchanged for the Ansell sheath on the right and the advantage wire negotiated into the proximal aorta.  Working with the Amplatz wire on the left and the advantage wire on the right magnified  imaging of the aortic bifurcation was obtained verifying the stenosis as described above.  I selected the 8 mm x 26 mm Lifestream stent for the right and an 8 mm x 38 millimeter Lifestream stent for the left.  Stents were moved into position hand-injection of contrast used to verify they were just above the aortic bifurcation and they were subsequently deployed to 14 atm for 30 seconds simultaneously.  Follow-up imaging demonstrated total resolution of both right and left common iliac artery stenoses with rapid flow of contrast and preservation of distal runoff.  At this point I elected to terminate the procedure and oblique views of both common femoral arteries is obtained and a Star close device deployed first on the left than on the right. There no immediate complications.   Findings:  The abdominal aorta is opacified with a bolus injection of contrast.  Is free of hemodynamically significant stenoses.  The renal arteries are noted there does not appear to be any hemodynamically significant renal artery stenosis at the origins.  There is a greater than 85% stenosis at the origin of the left common iliac with marked poststenotic dilatation.  The previously placed stent within the distal common and proximal two thirds of the external iliac artery is widely patent.  There is filling of the left internal iliac artery but it is on the right there is a 50 to 60% stenosis within the proximal right common iliac.  The external iliac arteries are patent bilaterally and free of hemodynamically significant stenosis.  The common femoral demonstrates moderate disease.  The profunda femoris is heavily diseased with greater than 70% lesions noted at its origin as well as in its proximal few centimeters.  The SFA demonstrates diffuse disease from its origin extending down to the popliteal.  There are numerous greater than 90% stenoses.  The origin of the SFA demonstrates a greater than 70% stenosis.  The previously  placed stent within the SFA is diffusely involved with this recurrent disease.  The popliteal remains patent although somewhat small the superior stent that was placed in the mid and distal popliteal remains widely patent.  There is single-vessel runoff via the peroneal to the foot.  Anterior tibial posterior tibial are occluded at their origins arraignment main occluded throughout their entire length.  Following angioplasty and stent placement the left SFA is now widely patent with less than 10% residual stenosis there is preservation of the popliteal and single-vessel peroneal runoff.  There is loss of the heavily diseased profunda femoris.  Following angioplasty and stent placement as described above at the aortic bifurcation using the kissing balloon technique there is now complete resolution of the common iliac artery stenoses with less than 5% residual stenosis.   Summary: Successful recanalization of left lower extremity for limb salvage    Disposition: Patient was taken to the recovery room in stable condition having tolerated the procedure well.  [name] [name], Dolores Lory 06/09/2019,4:09 PM

## 2019-06-10 ENCOUNTER — Encounter: Payer: Self-pay | Admitting: Vascular Surgery

## 2019-06-10 ENCOUNTER — Ambulatory Visit: Payer: Medicare Other | Admitting: Psychology

## 2019-06-10 ENCOUNTER — Other Ambulatory Visit (INDEPENDENT_AMBULATORY_CARE_PROVIDER_SITE_OTHER): Payer: Self-pay | Admitting: Nurse Practitioner

## 2019-06-10 ENCOUNTER — Telehealth (INDEPENDENT_AMBULATORY_CARE_PROVIDER_SITE_OTHER): Payer: Self-pay

## 2019-06-10 DIAGNOSIS — E1122 Type 2 diabetes mellitus with diabetic chronic kidney disease: Secondary | ICD-10-CM

## 2019-06-10 NOTE — Telephone Encounter (Signed)
I attempted to contact the patient regarding having lab work drawn at [facility]. A message was left for the patient to go have labs drawn on Thursday at [facility], if any question to please return the call.

## 2019-06-17 ENCOUNTER — Ambulatory Visit: Payer: Medicare Other | Admitting: Psychology

## 2019-06-17 ENCOUNTER — Telehealth: Payer: Self-pay

## 2019-06-17 MED ORDER — CARVEDILOL 12.5 MG PO TABS: 12.5000 mg | ORAL_TABLET | Freq: Two times a day (BID) | ORAL | 0 refills | Status: DC

## 2019-06-17 NOTE — Telephone Encounter (Signed)
Requested Prescriptions   Signed Prescriptions Disp Refills  . carvedilol (COREG) 12.5 MG tablet 60 tablet 0    Sig: Take 1 tablet (12.5 mg total) by mouth 2 (two) times daily.    Authorizing Provider: [name] A    Ordering User: Raelene Bott, Estephania Licciardi L

## 2019-06-19 ENCOUNTER — Telehealth: Payer: Self-pay | Admitting: *Deleted

## 2019-06-19 DIAGNOSIS — Z87891 Personal history of nicotine dependence: Secondary | ICD-10-CM

## 2019-06-19 DIAGNOSIS — Z122 Encounter for screening for malignant neoplasm of respiratory organs: Secondary | ICD-10-CM

## 2019-06-19 NOTE — Telephone Encounter (Signed)
Patient has been notified that lung cancer screening CT scan is due currently.  Confirmed that patient is within the appropriate age range, and asymptomatic, (no signs or symptoms of lung cancer). Patient denies illness that would prevent curative treatment for lung cancer if found. Verified smoking history. He is a former smoker that stopped smoking in 2016. He was a smoker for 50 years. Patient is agreeable for CT scan being scheduled. He prefers morning appointments.

## 2019-06-22 ENCOUNTER — Other Ambulatory Visit: Payer: Self-pay

## 2019-06-22 MED ORDER — CARVEDILOL 12.5 MG PO TABS: 12.5000 mg | ORAL_TABLET | Freq: Two times a day (BID) | ORAL | 0 refills | Status: DC

## 2019-06-24 ENCOUNTER — Encounter (INDEPENDENT_AMBULATORY_CARE_PROVIDER_SITE_OTHER): Payer: PRIVATE HEALTH INSURANCE

## 2019-06-24 ENCOUNTER — Other Ambulatory Visit: Payer: Self-pay

## 2019-06-24 ENCOUNTER — Ambulatory Visit (INDEPENDENT_AMBULATORY_CARE_PROVIDER_SITE_OTHER): Payer: PRIVATE HEALTH INSURANCE | Admitting: Nurse Practitioner

## 2019-06-24 ENCOUNTER — Ambulatory Visit: Payer: Medicare Other | Admitting: Psychology

## 2019-06-24 MED ORDER — CARVEDILOL 12.5 MG PO TABS: 12.5000 mg | ORAL_TABLET | Freq: Two times a day (BID) | ORAL | 0 refills | Status: DC

## 2019-06-26 NOTE — Telephone Encounter (Signed)
Smoking history, former, quit 10/18, 40.25 pack year

## 2019-06-26 NOTE — Addendum Note (Signed)
Addended by: Lieutenant Diego on: 06/26/2019 10:43 AM   Modules accepted: Orders

## 2019-06-29 ENCOUNTER — Other Ambulatory Visit: Payer: Self-pay | Admitting: *Deleted

## 2019-06-29 MED ORDER — CLOPIDOGREL BISULFATE 75 MG PO TABS: 75.0000 mg | ORAL_TABLET | Freq: Every day | ORAL | 0 refills | Status: DC

## 2019-07-01 ENCOUNTER — Other Ambulatory Visit: Payer: Self-pay

## 2019-07-01 ENCOUNTER — Ambulatory Visit
Admission: RE | Admit: 2019-07-01 | Discharge: 2019-07-01 | Disposition: A | Discharge status: Home / Self Care | Payer: Medicare Other | Source: Ambulatory Visit | Attending: Oncology | Admitting: Oncology

## 2019-07-01 ENCOUNTER — Ambulatory Visit: Payer: Medicare Other | Admitting: Psychology

## 2019-07-01 DIAGNOSIS — Z122 Encounter for screening for malignant neoplasm of respiratory organs: Secondary | ICD-10-CM | POA: Insufficient documentation

## 2019-07-01 DIAGNOSIS — Z87891 Personal history of nicotine dependence: Secondary | ICD-10-CM | POA: Diagnosis not present

## 2019-07-02 DIAGNOSIS — B351 Tinea unguium: Secondary | ICD-10-CM | POA: Diagnosis not present

## 2019-07-02 DIAGNOSIS — E1142 Type 2 diabetes mellitus with diabetic polyneuropathy: Secondary | ICD-10-CM | POA: Diagnosis not present

## 2019-07-02 LAB — HM DIABETES FOOT EXAM

## 2019-07-03 ENCOUNTER — Encounter: Payer: Self-pay | Admitting: *Deleted

## 2019-07-07 ENCOUNTER — Ambulatory Visit: Payer: Medicare Other | Admitting: Cardiovascular Disease

## 2019-07-08 ENCOUNTER — Ambulatory Visit: Payer: Medicare Other | Admitting: Psychology

## 2019-07-15 ENCOUNTER — Ambulatory Visit
Admission: EM | Admit: 2019-07-15 | Discharge: 2019-07-15 | Disposition: A | Discharge status: Home / Self Care | Payer: Medicare Other | Attending: Urgent Care | Admitting: Urgent Care

## 2019-07-15 ENCOUNTER — Other Ambulatory Visit: Payer: Self-pay

## 2019-07-15 ENCOUNTER — Ambulatory Visit: Payer: Medicare Other | Admitting: Psychology

## 2019-07-15 DIAGNOSIS — M25562 Pain in left knee: Secondary | ICD-10-CM

## 2019-07-15 DIAGNOSIS — M25512 Pain in left shoulder: Secondary | ICD-10-CM

## 2019-07-15 DIAGNOSIS — M792 Neuralgia and neuritis, unspecified: Secondary | ICD-10-CM | POA: Diagnosis not present

## 2019-07-15 MED ORDER — METHYLPREDNISOLONE 4 MG PO TBPK: ORAL_TABLET | ORAL | 0 refills | Status: DC

## 2019-07-15 NOTE — ED Triage Notes (Signed)
Patient complains of left arm and left leg pain that started Monday night. Patient states that he has history of neuropathy and is worse with movement.

## 2019-07-15 NOTE — Discharge Instructions (Signed)
It was very nice seeing you today in clinic. Thank you for entrusting me with your care.   Rest and apply heat moist heat 3-4  times a day for at least 15-20 minutes at a time. May use Tylenol. Use prescribed steroid course. If note improving, call Dr. [name] to discuss further intervention.   Make arrangements to follow up with your regular doctor in 1 week for re-evaluation if not improving. If your symptoms/condition worsens, please seek follow up care either here or in the ER. Please remember, our [facility] providers are "right here with you" when you need [place].   Again, it was my pleasure to take care of you today. Thank you for choosing our clinic. I hope that you start to feel better quickly.   Honor Loh, MSN, APRN, FNP-C, CEN Advanced Practice Provider [facility]

## 2019-07-17 NOTE — ED Provider Notes (Signed)
[place], [place]   Name: [name] [name] DOB: [birth year] MRN: [id number] CSN: [id number] PCP: [name], MD  Arrival date and time:  07/15/19 1203  Chief Complaint:  Arm Pain (left) and Leg Pain (left)   NOTE: Prior to seeing the patient today, I have reviewed the triage nursing documentation and vital signs. Clinical staff has updated patient's PMH/PSHx, current medication list, and drug allergies/intolerances to ensure comprehensive history available to assist in medical decision making.   History:   HPI: [name] [name] is a 63 y.o. male who presents today with complaints of pain in his LEFT shoulder and LEFT knee. He denies known injury. Pain started on Monday of this week (07/13/2019). Patient initially thought that he slept on his arm wrong, however the pain has worsened. He denies weakness, however notes paraesthesia extending from the shoulder to about the level of the mid-forearm. He has not appreciated any associated erythema, swelling, or warmth. No claudication pain associated with ambulation. (+) FROM in both the LUE and LLE. No ex PMH (+) for RA and neuropathy. Patient ambulates with the use of gait assistance aids (cane). Pain has been significant and has prevented patient from being able to achieve restful sleep since Monday night. In efforts to conservatively manage his symptoms at home, the patient notes that he has used IBU, which has not helped to improve his symptoms.   Past Medical History:  Diagnosis Date  . Adenomatous colon polyp   . Allergy   . Angina at rest [facility])    chronic  . Anxiety   . Arthritis    RA  . Bell's palsy 2007  . Carotid artery occlusion   . Cataract    Dr. Dawna Part  . Coronary artery disease   . Depression   . Diabetes mellitus   . Diverticulosis   . Duodenitis   . DVT (deep venous thrombosis) (HCC)    in leg  . Dyspnea    DOE  . Gastropathy   . GERD (gastroesophageal reflux disease)   . Heart murmur   . Helicobacter pylori  gastritis   . Hyperlipidemia   . Hypertension   . Kidney failure   . Mild aortic stenosis   . Neuropathy   . Obesity   . Peripheral vascular disease ([place])   . Post splenectomy syndrome   . Psoriasis   . Psoriatic arthritis ([place])   . Renal disorder    stage 4 kidney failure  . Sleep apnea    uses cpap  . SOB (shortness of breath)   . Stroke ([place])   . Tobacco use disorder    recently quit  . Tubular adenoma 08/2013   Dr. Hilarie Fredrickson  . Weak urinary stream     Past Surgical History:  Procedure Laterality Date  . CARDIAC CATHETERIZATION  08/2010   LAD: 80% ISR, RCA: 80% ostial, OM 80-90%  . CAROTID ENDARTERECTOMY  08/2010   left/ Dr. Kellie Simmering  . CATARACT EXTRACTION W/PHACO Left 07/09/2017   Procedure: CATARACT EXTRACTION PHACO AND INTRAOCULAR LENS PLACEMENT (IOC);  Surgeon: Birder Robson, MD;  Location: [facility] ORS;  Service: Ophthalmology;  Laterality: Left;  [place] 00:23 AP% 14.2 CDE 3.26 Fluid pack lot # [id number]  . CATARACT EXTRACTION W/PHACO Right 09/10/2017   Procedure: CATARACT EXTRACTION PHACO AND INTRAOCULAR LENS PLACEMENT (IOC);  Surgeon: Birder Robson, MD;  Location: [facility] ORS;  Service: Ophthalmology;  Laterality: Right;  [place] 00:26.4 AP% 17.3 CDE 4.59 Fluid Pack Lot # [id number]  . COLONOSCOPY    .  CORONARY ANGIOPLASTY     LAD: before CABG  . CORONARY ARTERY BYPASS GRAFT  09/06/2010   At [facility]: LIMA to LAD, left radial to RCA, sequential SVG to OM3 and 4  . heart stents  Jan 2011   leg stents 06/2009 and 03/2010  . LOWER EXTREMITY ANGIOGRAPHY Left 06/09/2019   Procedure: LOWER EXTREMITY ANGIOGRAPHY;  Surgeon: Katha Cabal, MD;  Location: [facility];  Service: Cardiovascular;  Laterality: Left;  . PTA of illiac and SFA  multiple   Dr. Ronalee Belts, s/p revision 11/2012  . SPLENECTOMY    . VASCULAR SURGERY     LEG STENTS    Family History  Problem Relation Age of Onset  . Lung cancer Father 83  . Arthritis Father   . Breast cancer Sister 62  . Alcoholism  Sister   . Dementia Mother   . Alzheimer's disease Mother   . Arthritis Mother   . Alcoholism Brother   . Epilepsy Sister   . Diabetes Paternal Grandfather   . Colon polyps Paternal Grandfather 28  . Alcoholism Sister   . Alcoholism Sister   . Alcoholism Brother     Social History   Tobacco Use  . Smoking status: Former Smoker    Packs/day: 0.75    Years: 40.00    Pack years: 30.00    Types: Cigarettes    Quit date: 04/15/2017    Years since quitting: 2.2  . Smokeless tobacco: Never Used  . Tobacco comment: 1 cigarette a day  Substance Use Topics  . Alcohol use: No  . Drug use: No    Patient Active Problem List   Diagnosis Date Noted  . Left arm pain 02/11/2019  . Gastroesophageal reflux 03/25/2018  . Atherosclerosis of aorta ([place]) 09/04/2017  . Thoracic aortic aneurysm ([place]) 09/04/2017  . Right thyroid nodule 04/12/2017  . Thrombocytopenia ([place]) 03/16/2017  . CKD stage 4 due to type 2 diabetes mellitus ([place]) 03/15/2017  . Advance directive discussed with patient 03/15/2017  . BMI 40.0-44.9, adult ([place]) 09/12/2016  . Psoriatic arthritis ([place])   . Lung nodule < 6cm on CT 03/29/2016  . COPD with acute bronchitis ([place]) 03/29/2016  . Headache 09/14/2015  . Constipation 10/05/2014  . PVD (peripheral vascular disease) ([place]) 04/23/2014  . Memory loss 04/23/2014  . Aortic valve disorder 01/11/2014  . Preventative health care 11/23/2013  . Benign prostatic hypertrophy without urinary obstruction 03/25/2013  . Enlarged prostate without lower urinary tract symptoms (luts) 03/25/2013  . Peripheral vascular disease due to secondary diabetes mellitus ([place]) 03/25/2013  . Obesity 02/12/2013  . Coronary atherosclerosis 06/06/2012  . Nonrheumatic aortic valve stenosis   . Low back pain 12/27/2011  . MDD (major depressive disorder), recurrent episode ([place]) 05/28/2011  . Carotid artery disease ([place]) 04/17/2011  . Occlusion and stenosis of carotid artery 04/17/2011  . Sleep  apnea 03/05/2011  . Psoriasis 03/05/2011  . Neuropathy ([place]) 03/05/2011  . Type 2 diabetes mellitus with diabetic peripheral angiopathy without gangrene ([place]) 03/05/2011  . Polyneuropathy 03/05/2011  . Coronary artery disease   . Hyperlipidemia, mixed   . Essential hypertension     Home Medications:    Current Meds  Medication Sig  . ACCU-CHEK FASTCLIX LANCETS MISC Use to check sugar 1 time daily.  [name] albuterol (PROAIR HFA) 108 (90 Base) MCG/ACT inhaler Inhale 2 puffs into the lungs every 6 (six) hours as needed for shortness of breath.  [name] aspirin 81 MG tablet Take 81 mg by mouth daily.  [name]  buPROPion (WELLBUTRIN XL) 300 MG 24 hr tablet Take 1 tablet (300 mg total) by mouth daily.  . busPIRone (BUSPAR) 10 MG tablet TAKE 2 TABLETS(20 MG) BY MOUTH TWICE DAILY  . calcitRIOL (ROCALTROL) 0.25 MCG capsule Take 0.25 mcg by mouth 3 (three) times a week.  . carvedilol (COREG) 12.5 MG tablet Take 1 tablet (12.5 mg total) by mouth 2 (two) times daily.  . Cholecalciferol (VITAMIN D PO) Take 1 capsule by mouth daily.   . clopidogrel (PLAVIX) 75 MG tablet Take 1 tablet (75 mg total) by mouth daily.  . CONTOUR TEST test strip TEST EVERY DAY  . Dextran 70-Hypromellose (ARTIFICIAL TEARS) 0.1-0.3 % SOLN Place 1 drop into both eyes 4 (four) times daily as needed (dry eyes).  . famotidine (PEPCID) 40 MG tablet Take 1 tablet (40 mg total) by mouth 2 (two) times daily.  . finasteride (PROSCAR) 5 MG tablet Take 5 mg by mouth daily.  . furosemide (LASIX) 20 MG tablet Take 20 mg by mouth daily.  Vanessa Kick Ethyl (VASCEPA) 1 g CAPS Take 2 capsules (2 g total) by mouth daily.  . insulin NPH Human (NOVOLIN N RELION) 100 UNIT/ML injection Inject 0.2 mLs (20 Units total) into the skin at bedtime. (Patient taking differently: Inject 20 Units into the skin at bedtime as needed (high blood sugar). )  . insulin regular (NOVOLIN R) 100 units/mL injection Inject 20 Units into the skin See admin instructions. Per sliding  scale up to 3 times daily if needed  . losartan (COZAAR) 100 MG tablet TAKE 1 TABLET(100 MG) BY MOUTH DAILY (Patient taking differently: Take 100 mg by mouth daily. )  . Multiple Vitamin (MULTIVITAMIN) capsule Take 1 capsule by mouth daily.   . nitroGLYCERIN (NITROSTAT) 0.4 MG SL tablet Place 1 tablet (0.4 mg total) under the tongue every 5 (five) minutes as needed.  [name] RELION INSULIN SYRINGE 31G X 15/64" 1 ML MISC ONE INJECTION THREE TIMES DAILY  . sertraline (ZOLOFT) 100 MG tablet Take 2 tablets (200 mg total) by mouth daily.  . simvastatin (ZOCOR) 20 MG tablet Take 1 tablet (20 mg total) by mouth daily at 6 PM.  . tamsulosin (FLOMAX) 0.4 MG CAPS capsule take 2 capsule by mouth daily    Allergies:   Contrast media [iodinated diagnostic agents], Glipizide, Metrizamide, and Penicillins  Review of Systems (ROS): Review of Systems  Constitutional: Negative for chills and fever.  Respiratory: Negative for cough and shortness of breath.   Cardiovascular: Negative for chest pain and palpitations.  Endocrine:       PMH (+) for diabetes  Musculoskeletal: Positive for arthralgias and gait problem. Negative for joint swelling.  Skin: Negative for color change, pallor and rash.  Neurological: Positive for numbness (paraesthesias in LUE). Negative for dizziness, weakness and headaches.  All other systems reviewed and are negative.    Vital Signs: Today's Vitals   07/15/19 1229 07/15/19 1232 07/15/19 1255  BP:  (!) 150/82   Pulse:  95   Resp:  17   Temp:  98.4 F (36.9 C)   TempSrc:  Oral   SpO2:  99%   Weight: 231 lb (104.8 kg)    PainSc: 10-Worst pain ever  10-Worst pain ever    Physical Exam: Physical Exam  Constitutional: He is oriented to person, place, and time and well-developed, well-nourished, and in no distress.  HENT:  Head: Normocephalic and atraumatic.  Mouth/Throat: Mucous membranes are normal.  Eyes: Pupils are equal, round, and reactive to light.  EOM are normal.    Cardiovascular: Normal rate and intact distal pulses.  Pulmonary/Chest: Effort normal and breath sounds normal. No respiratory distress.  Musculoskeletal:     Left shoulder: Pain (+) radicular symptoms extrending from shoulder to forearm present. No swelling, deformity, tenderness or spasms. Normal range of motion (+) FROM. Normal strength. Normal pulse.     Cervical back: Full passive range of motion without pain and neck supple.     Left knee: No swelling, effusion, erythema or ecchymosis. Normal range of motion (+) FROM. Tenderness (generalized) present. Normal alignment.     Comments: (+) PMS noted distally in LUE and LLE. Cap refill WNL. FROM without difficulty. Color and temperature of extremities normal.   Neurological: He is alert and oriented to person, place, and time. Gait normal.  Skin: Skin is warm and dry. No rash noted.  Psychiatric: Mood, memory, affect and judgment normal.  Nursing note and vitals reviewed.   Urgent Care Treatments / Results:   No orders of the defined types were placed in this encounter.   LABS: PLEASE NOTE: all labs that were ordered this encounter are listed, however only abnormal results are displayed. Labs Reviewed - No data to display  EKG: -None  RADIOLOGY: No results found.  PROCEDURES: Procedures  MEDICATIONS RECEIVED THIS VISIT: Medications - No data to display  PERTINENT CLINICAL COURSE NOTES/UPDATES:   Initial Impression / Assessment and Plan / Urgent Care Course:  Pertinent labs & imaging results that were available during my care of the patient were personally reviewed by me and considered in my medical decision making (see lab/imaging section of note for values and interpretations).  [name] [name] is a 63 y.o. male who presents to [facility] today with complaints of Arm Pain (left) and Leg Pain (left)   Patient is well appearing overall in clinic today. He does not appear to be in any acute distress. Presenting  symptoms (see HPI) and exam as documented above. No trauma, falls, or other injuries. Symptoms multifactorial and felt to be related to known arthritis, sleep positioning, and altered gait pattern/cane use. Patient has CKD-IV, thus will avoid NSAIDs. He was been using IBU; advised to discontinue and use APAP instead. He is at increased risk of falls so I will avoid opoid interventions as well. PMH (+) for known T2DM. He reports good management of his CBGs at home; checks regularly and they have been running in the low 100s. Given his radicular pain and history of RA, discussed the use of a systemic steroid taper to help with his pain. Patient in agreement. Will pursue treatment using Medrol dose pack. Discussed risk of transient elevations in his blood glucose levels. He was encouraged to monitor his sugars closely. Discussed increasing fluid intake to help with glucose levels while on steroids. He notes that he has used glucocorticoids in the past with only mild elevations in his CBGs. He was educated on complimentary modalities to help with his pain. Patient encouraged to rest and avoid overuse. He will likely find added benefit of applying moist heat TID-QID for at least 15-20 minutes at a time; written information provided on today's AVS.  Discussed follow up with primary care physician in 1 week for re-evaluation. I have reviewed the follow up and strict return precautions for any new or worsening symptoms. Patient is aware of symptoms that would be deemed urgent/emergent, and would thus require further evaluation either here or in the emergency department. At the time of discharge, he  verbalized understanding and consent with the discharge plan as it was reviewed with him. All questions were fielded by provider and/or clinic staff prior to patient discharge.    Final Clinical Impressions / Urgent Care Diagnoses:   Final diagnoses:  Radicular pain in left arm  Acute pain of left shoulder  Acute pain of  left knee    New Prescriptions:  [place] Controlled Substance Registry consulted? Not Applicable  Meds ordered this encounter  Medications  . methylPREDNISolone (MEDROL DOSEPAK) 4 MG TBPK tablet    Sig: Take by mouth daily - taper daily dose per package instructions.    Dispense:  21 tablet    Refill:  0    Recommended Follow up Care:  Patient encouraged to follow up with the following provider within the specified time frame, or sooner as dictated by the severity of his symptoms. As always, he was instructed that for any urgent/emergent care needs, he should seek care either here or in the emergency department for more immediate evaluation.  Follow-up Information    [name], MD In 1 week.   Specialties: Internal Medicine, Pediatrics Contact information: [place] [address] [phone]         NOTE: This note was prepared using Dragon dictation software along with smaller phrase technology. Despite my best ability to proofread, there is the potential that transcriptional errors may still occur from this process, and are completely unintentional.    Karen Kitchens, NP 07/17/19 1435

## 2019-07-22 ENCOUNTER — Ambulatory Visit: Payer: Medicare Other | Admitting: Psychology

## 2019-07-24 DIAGNOSIS — I219 Acute myocardial infarction, unspecified: Secondary | ICD-10-CM

## 2019-07-24 HISTORY — DX: Acute myocardial infarction, unspecified: I21.9

## 2019-07-28 ENCOUNTER — Other Ambulatory Visit: Payer: Self-pay

## 2019-07-28 MED ORDER — CARVEDILOL 12.5 MG PO TABS: 12.5000 mg | ORAL_TABLET | Freq: Two times a day (BID) | ORAL | 0 refills | Status: DC

## 2019-07-29 ENCOUNTER — Ambulatory Visit: Payer: Medicare Other | Admitting: Psychology

## 2019-07-29 ENCOUNTER — Ambulatory Visit (INDEPENDENT_AMBULATORY_CARE_PROVIDER_SITE_OTHER): Payer: Medicare Other

## 2019-07-29 ENCOUNTER — Ambulatory Visit (INDEPENDENT_AMBULATORY_CARE_PROVIDER_SITE_OTHER): Payer: Medicare Other | Admitting: Nurse Practitioner

## 2019-07-29 ENCOUNTER — Other Ambulatory Visit (INDEPENDENT_AMBULATORY_CARE_PROVIDER_SITE_OTHER): Payer: Self-pay | Admitting: Vascular Surgery

## 2019-07-29 ENCOUNTER — Encounter (INDEPENDENT_AMBULATORY_CARE_PROVIDER_SITE_OTHER): Payer: Self-pay | Admitting: Nurse Practitioner

## 2019-07-29 ENCOUNTER — Other Ambulatory Visit: Payer: Self-pay

## 2019-07-29 VITALS — BP 164/79 | HR 99 | Resp 18 | Ht 67.0 in | Wt 231.0 lb

## 2019-07-29 DIAGNOSIS — I70222 Atherosclerosis of native arteries of extremities with rest pain, left leg: Secondary | ICD-10-CM

## 2019-07-29 DIAGNOSIS — Z95820 Peripheral vascular angioplasty status with implants and grafts: Secondary | ICD-10-CM

## 2019-07-29 DIAGNOSIS — E782 Mixed hyperlipidemia: Secondary | ICD-10-CM | POA: Diagnosis not present

## 2019-07-29 DIAGNOSIS — I10 Essential (primary) hypertension: Secondary | ICD-10-CM

## 2019-07-29 NOTE — Progress Notes (Signed)
SUBJECTIVE:  Patient ID: [name] [name], male    DOB: December 23, 1955, 64 y.o.   MRN: [id number] Chief Complaint  Patient presents with  . Follow-up    ultrasound    HPI  [name] [name] is a 64 y.o. male The patient returns to the office for followup and review status post angiogram with intervention. The patient notes improvement in the lower extremity symptoms. No interval shortening of the patient's claudication distance or rest pain symptoms.  No new ulcers or wounds have occurred since the last visit.  The patient does continue to have some lower extremity pain however he relates this to mostly arthritis and neuropathy.  On 06/09/2019, the patient underwent angioplasty and stenting of the bilateral common iliac arteries using a kissing balloon technique in addition to stenting of the left SFA and popliteal artery.  There have been no significant changes to the patient's overall health care.  The patient denies amaurosis fugax or recent TIA symptoms. There are no recent neurological changes noted. The patient denies history of DVT, PE or superficial thrombophlebitis. The patient denies recent episodes of angina or shortness of breath.   ABI's Rt=1.09 and Lt=1.05  (previous ABI's Rt=1.14 and Lt=0.93) Duplex ultrasound of the right lower extremity shows biphasic waveforms down to the level of the tibial arteries where the posterior tibial artery transitions to monophasic waveforms.  The left lower extremity has mostly biphasic waveforms except for monophasic waveforms at the deep femoral artery.  There are also monophasic waveforms in the anterior tibial and posterior tibial distal arteries.  Previously placed stents are open and patent.  Past Medical History:  Diagnosis Date  . Adenomatous colon polyp   . Allergy   . Angina at rest [facility])    chronic  . Anxiety   . Arthritis    RA  . Bell's palsy 2007  . Carotid artery occlusion   . Cataract    Dr. Dawna Part  . Coronary artery  disease   . Depression   . Diabetes mellitus   . Diverticulosis   . Duodenitis   . DVT (deep venous thrombosis) (H[facility])    in leg  . Dyspnea    DOE  . Gastropathy   . GERD (gastroesophageal reflux disease)   . Heart murmur   . Helicobacter pylori gastritis   . Hyperlipidemia   . Hypertension   . Kidney failure   . Mild aortic stenosis   . Neuropathy   . Obesity   . Peripheral vascular disease ([place])   . Post splenectomy syndrome   . Psoriasis   . Psoriatic arthritis ([place])   . Renal disorder    stage 4 kidney failure  . Sleep apnea    uses cpap  . SOB (shortness of breath)   . Stroke ([place])   . Tobacco use disorder    recently quit  . Tubular adenoma 08/2013   Dr. Hilarie Fredrickson  . Weak urinary stream     Past Surgical History:  Procedure Laterality Date  . CARDIAC CATHETERIZATION  08/2010   LAD: 80% ISR, RCA: 80% ostial, OM 80-90%  . CAROTID ENDARTERECTOMY  08/2010   left/ Dr. Kellie Simmering  . CATARACT EXTRACTION W/PHACO Left 07/09/2017   Procedure: CATARACT EXTRACTION PHACO AND INTRAOCULAR LENS PLACEMENT (IOC);  Surgeon: Birder Robson, MD;  Location: [facility] ORS;  Service: Ophthalmology;  Laterality: Left;  [place] 00:23 AP% 14.2 CDE 3.26 Fluid pack lot # [id number]  . CATARACT EXTRACTION W/PHACO Right 09/10/2017   Procedure: CATARACT EXTRACTION PHACO  AND INTRAOCULAR LENS PLACEMENT (IOC);  Surgeon: Birder Robson, MD;  Location: [facility] ORS;  Service: Ophthalmology;  Laterality: Right;  [place] 00:26.4 AP% 17.3 CDE 4.59 Fluid Pack Lot # [id number]  . COLONOSCOPY    . CORONARY ANGIOPLASTY     LAD: before CABG  . CORONARY ARTERY BYPASS GRAFT  09/06/2010   At [facility]: LIMA to LAD, left radial to RCA, sequential SVG to OM3 and 4  . heart stents  Jan 2011   leg stents 06/2009 and 03/2010  . LOWER EXTREMITY ANGIOGRAPHY Left 06/09/2019   Procedure: LOWER EXTREMITY ANGIOGRAPHY;  Surgeon: Katha Cabal, MD;  Location: [place] [facility];  Service: Cardiovascular;  Laterality: Left;  . PTA of  illiac and SFA  multiple   Dr. Ronalee Belts, s/p revision 11/2012  . SPLENECTOMY    . VASCULAR SURGERY     LEG STENTS    Social History   Socioeconomic History  . Marital status: Widowed    Spouse name: Not on file  . Number of children: 2  . Years of education: Not on file  . Highest education level: Not on file  Occupational History  . Occupation: Chief Operating Officer at [employer]: Disabled mostly due to neuropathy  Tobacco Use  . Smoking status: Former Smoker    Packs/day: 0.75    Years: 40.00    Pack years: 30.00    Types: Cigarettes    Quit date: 04/15/2017    Years since quitting: 2.2  . Smokeless tobacco: Never Used  . Tobacco comment: 1 cigarette a day  Substance and Sexual Activity  . Alcohol use: No  . Drug use: No  . Sexual activity: Never  Other Topics Concern  . Not on file  Social History Narrative   Wife died 01-Feb-2019      Has living will   Gearldine Shown and his wife Levada Dy should be his health care POA   Would accept resuscitation --but no prolonged ventilation   No tube feeds if cognitively unaware   Social Determinants of Health   Financial Resource Strain:   . Difficulty of Paying Living Expenses: Not on file  Food Insecurity: No Food Insecurity  . Worried About Charity fundraiser in the Last Year: Never true  . Ran Out of Food in the Last Year: Never true  Transportation Needs: No Transportation Needs  . Lack of Transportation (Medical): No  . Lack of Transportation (Non-Medical): No  Physical Activity:   . Days of Exercise per Week: Not on file  . Minutes of Exercise per Session: Not on file  Stress: No Stress Concern Present  . Feeling of Stress : Only a little  Social Connections: Unknown  . Frequency of Communication with Friends and Family: More than three times a week  . Frequency of Social Gatherings with Friends and Family: Not on file  . Attends Religious Services: Not on file  . Active Member of Clubs or Organizations: Not on  file  . Attends Archivist Meetings: Not on file  . Marital Status: Not on file  Intimate Partner Violence: Unknown  . Fear of Current or Ex-Partner: No  . Emotionally Abused: No  . Physically Abused: No  . Sexually Abused: Not on file    Family History  Problem Relation Age of Onset  . Lung cancer Father 58  . Arthritis Father   . Breast cancer Sister 17  . Alcoholism Sister   . Dementia Mother   .  Alzheimer's disease Mother   . Arthritis Mother   . Alcoholism Brother   . Epilepsy Sister   . Diabetes Paternal Grandfather   . Colon polyps Paternal Grandfather 15  . Alcoholism Sister   . Alcoholism Sister   . Alcoholism Brother     Allergies  Allergen Reactions  . Contrast Media [Iodinated Diagnostic Agents] Rash and Other (See Comments)    Got very hot and red   . Glipizide Other (See Comments)    ANTIDIABETICS. Burning  . Metrizamide Other (See Comments)    Got very hot and red  . Penicillins Hives and Swelling    Has patient had a PCN reaction causing immediate rash, facial/tongue/throat swelling, SOB or lightheadedness with hypotension: yes Has patient had a PCN reaction causing severe rash involving mucus membranes or skin necrosis: no  Has patient had a PCN reaction that required hospitalization: yes Has patient had a PCN reaction occurring within the last 10 years: no If all of the above answers are "NO", then may proceed with Cephalosporin use.      Review of Systems   Review of Systems: Negative Unless Checked Constitutional: [] Weight loss  [] Fever  [] Chills Cardiac: [] Chest pain   []  Atrial Fibrillation  [] Palpitations   [] Shortness of breath when laying flat   [] Shortness of breath with exertion. [] Shortness of breath at rest Vascular:  [] Pain in legs with walking   [] Pain in legs with standing [] Pain in legs when laying flat   [] Claudication    [] Pain in feet when laying flat    [x] History of DVT   [] Phlebitis   [] Swelling in legs   [] Varicose  veins   [] Non-healing ulcers Pulmonary:   [] Uses home oxygen   [] Productive cough   [] Hemoptysis   [] Wheeze  [] COPD   [] Asthma Neurologic:  [] Dizziness   [] Seizures  [] Blackouts [] History of stroke   [] History of TIA  [] Aphasia   [] Temporary Blindness   [] Weakness or numbness in arm   [] Weakness or numbness in leg Musculoskeletal:   [] Joint swelling   [] Joint pain   [] Low back pain  []  History of Knee Replacement [x] Arthritis [] back Surgeries  []  Spinal Stenosis    Hematologic:  [] Easy bruising  [] Easy bleeding   [] Hypercoagulable state   [] Anemic Gastrointestinal:  [] Diarrhea   [] Vomiting  [x] Gastroesophageal reflux/heartburn   [] Difficulty swallowing. [] Abdominal pain Genitourinary:  [] Chronic kidney disease   [] Difficult urination  [] Anuric   [] Blood in urine [] Frequent urination  [] Burning with urination   [] Hematuria Skin:  [] Rashes   [] Ulcers [] Wounds Psychological:  [x] History of anxiety   [x]  History of major depression  []  Memory Difficulties      OBJECTIVE:   Physical Exam  BP (!) 164/79 (BP Location: Right Arm)   Pulse 99   Resp 18   Ht 5\' 7"  (1.702 m)   Wt 231 lb (104.8 kg)   BMI 36.18 kg/m   Gen: WD/WN, NAD Head: [place]/AT, No temporalis wasting.  Ear/Nose/Throat: Hearing grossly intact, nares w/o erythema or drainage Eyes: PER, EOMI, sclera nonicteric.  Neck: Supple, no masses.  No JVD.  Pulmonary:  Good air movement, no use of accessory muscles.  Cardiac: RRR Vascular:  Vessel Right Left  Radial Palpable Palpable  Dorsalis Pedis Palpable Palpable  Posterior Tibial Palpable Palpable   Gastrointestinal: soft, non-distended. No guarding/no peritoneal signs.  Musculoskeletal: Uses cane for ambulation. No deformity or atrophy.  Neurologic: Pain and light touch intact in extremities.  Symmetrical.  Speech is fluent. Motor exam as  listed above. Psychiatric: Judgment intact, Mood & affect appropriate for pt's clinical situation. Dermatologic: No Venous rashes. No Ulcers  Noted.  No changes consistent with cellulitis.        ASSESSMENT AND PLAN:  1. Atherosclerosis of native artery of left leg with rest pain ([place]) Recommend:  The patient is status post successful angiogram with intervention.  The patient reports that the claudication symptoms and leg pain is essentially gone.   The patient denies lifestyle limiting changes at this point in time.  No further invasive studies, angiography or surgery at this time The patient should continue walking and begin a more formal exercise program.  The patient should continue antiplatelet therapy and aggressive treatment of the lipid abnormalities   The patient should continue wearing graduated compression socks 10-15 mmHg strength to control the mild edema.  Patient should undergo noninvasive studies as ordered. The patient will follow up with me after the studies.    - VAS [place] ABI WITH/WO TBI; Future  2. Essential hypertension Blood pressure control is important to control atherosclerotic disease progression.  Blood pressure slightly elevated today.  On appropriate medications.  No changes made today.  3. Hyperlipidemia, mixed Good lipid control important to control atherosclerotic disease progression.  Patient on appropriate statin medication.  No changes made.   Current Outpatient Medications on File Prior to Visit  Medication Sig Dispense Refill  . ACCU-CHEK FASTCLIX LANCETS MISC Use to check sugar 1 time daily. 100 each 5  . albuterol (PROAIR HFA) 108 (90 Base) MCG/ACT inhaler Inhale 2 puffs into the lungs every 6 (six) hours as needed for shortness of breath. 1 Inhaler 3  . aspirin 81 MG tablet Take 81 mg by mouth daily.    [name] buPROPion (WELLBUTRIN XL) 300 MG 24 hr tablet Take 1 tablet (300 mg total) by mouth daily. 90 tablet 3  . busPIRone (BUSPAR) 10 MG tablet TAKE 2 TABLETS(20 MG) BY MOUTH TWICE DAILY 360 tablet 3  . calcitRIOL (ROCALTROL) 0.25 MCG capsule Take 0.25 mcg by mouth 3 (three) times a  week.    . carvedilol (COREG) 12.5 MG tablet Take 1 tablet (12.5 mg total) by mouth 2 (two) times daily. Please schedule appointment for further refills. 30 tablet 0  . Cholecalciferol (VITAMIN D PO) Take 1 capsule by mouth daily.     . clopidogrel (PLAVIX) 75 MG tablet Take 1 tablet (75 mg total) by mouth daily. 90 tablet 0  . CONTOUR TEST test strip TEST EVERY DAY 100 strip 11  . Dextran 70-Hypromellose (ARTIFICIAL TEARS) 0.1-0.3 % SOLN Place 1 drop into both eyes 4 (four) times daily as needed (dry eyes).    . finasteride (PROSCAR) 5 MG tablet Take 5 mg by mouth daily.    . furosemide (LASIX) 20 MG tablet Take 20 mg by mouth daily.    Vanessa Kick Ethyl (VASCEPA) 1 g CAPS Take 2 capsules (2 g total) by mouth daily. 120 capsule 5  . insulin NPH Human (NOVOLIN N RELION) 100 UNIT/ML injection Inject 0.2 mLs (20 Units total) into the skin at bedtime. (Patient taking differently: Inject 20 Units into the skin at bedtime as needed (high blood sugar). ) 10 mL 11  . insulin regular (NOVOLIN R) 100 units/mL injection Inject 20 Units into the skin See admin instructions. Per sliding scale up to 3 times daily if needed    . losartan (COZAAR) 100 MG tablet TAKE 1 TABLET(100 MG) BY MOUTH DAILY (Patient taking differently: Take 100 mg by mouth  daily. ) 30 tablet 2  . nitroGLYCERIN (NITROSTAT) 0.4 MG SL tablet Place 1 tablet (0.4 mg total) under the tongue every 5 (five) minutes as needed. 25 tablet 1  . RELION INSULIN SYRINGE 31G X 15/64" 1 ML MISC ONE INJECTION THREE TIMES DAILY 200 each 2  . sertraline (ZOLOFT) 100 MG tablet Take 2 tablets (200 mg total) by mouth daily. 180 tablet 3  . simvastatin (ZOCOR) 20 MG tablet Take 1 tablet (20 mg total) by mouth daily at 6 PM. 90 tablet 0  . tamsulosin (FLOMAX) 0.4 MG CAPS capsule take 2 capsule by mouth daily 180 capsule 3  . famotidine (PEPCID) 40 MG tablet Take 1 tablet (40 mg total) by mouth 2 (two) times daily. (Patient not taking: Reported on 07/29/2019) 180  tablet 3  . methylPREDNISolone (MEDROL DOSEPAK) 4 MG TBPK tablet Take by mouth daily - taper daily dose per package instructions. (Patient not taking: Reported on 07/29/2019) 21 tablet 0  . Multiple Vitamin (MULTIVITAMIN) capsule Take 1 capsule by mouth daily.     . [DISCONTINUED] DULoxetine (CYMBALTA) 60 MG capsule Take 1 capsule (60 mg total) by mouth 2 (two) times daily. 180 capsule 3   No current facility-administered medications on file prior to visit.    There are no Patient Instructions on file for this visit. No follow-ups on file.   Kris Hartmann, NP  This note was completed with Sales executive.  Any errors are purely unintentional.

## 2019-08-05 ENCOUNTER — Ambulatory Visit: Payer: Medicare Other | Admitting: Psychology

## 2019-08-05 DIAGNOSIS — M0579 Rheumatoid arthritis with rheumatoid factor of multiple sites without organ or systems involvement: Secondary | ICD-10-CM | POA: Diagnosis not present

## 2019-08-05 DIAGNOSIS — Z79899 Other long term (current) drug therapy: Secondary | ICD-10-CM | POA: Diagnosis not present

## 2019-08-20 ENCOUNTER — Other Ambulatory Visit: Payer: Self-pay

## 2019-08-20 MED ORDER — SIMVASTATIN 20 MG PO TABS: 20.0000 mg | ORAL_TABLET | Freq: Every day | ORAL | 0 refills | Status: DC

## 2019-08-26 DIAGNOSIS — E785 Hyperlipidemia, unspecified: Secondary | ICD-10-CM | POA: Diagnosis not present

## 2019-08-26 DIAGNOSIS — B182 Chronic viral hepatitis C: Secondary | ICD-10-CM | POA: Diagnosis not present

## 2019-08-26 DIAGNOSIS — N184 Chronic kidney disease, stage 4 (severe): Secondary | ICD-10-CM | POA: Diagnosis not present

## 2019-08-26 DIAGNOSIS — E1122 Type 2 diabetes mellitus with diabetic chronic kidney disease: Secondary | ICD-10-CM | POA: Diagnosis not present

## 2019-08-26 DIAGNOSIS — I10 Essential (primary) hypertension: Secondary | ICD-10-CM | POA: Diagnosis not present

## 2019-08-26 DIAGNOSIS — D631 Anemia in chronic kidney disease: Secondary | ICD-10-CM | POA: Diagnosis not present

## 2019-08-26 DIAGNOSIS — R809 Proteinuria, unspecified: Secondary | ICD-10-CM | POA: Diagnosis not present

## 2019-08-26 DIAGNOSIS — E875 Hyperkalemia: Secondary | ICD-10-CM | POA: Diagnosis not present

## 2019-09-02 DIAGNOSIS — N2581 Secondary hyperparathyroidism of renal origin: Secondary | ICD-10-CM | POA: Diagnosis not present

## 2019-09-02 DIAGNOSIS — E1122 Type 2 diabetes mellitus with diabetic chronic kidney disease: Secondary | ICD-10-CM | POA: Diagnosis not present

## 2019-09-02 DIAGNOSIS — R809 Proteinuria, unspecified: Secondary | ICD-10-CM | POA: Insufficient documentation

## 2019-09-02 DIAGNOSIS — I129 Hypertensive chronic kidney disease with stage 1 through stage 4 chronic kidney disease, or unspecified chronic kidney disease: Secondary | ICD-10-CM | POA: Insufficient documentation

## 2019-09-02 DIAGNOSIS — N184 Chronic kidney disease, stage 4 (severe): Secondary | ICD-10-CM | POA: Diagnosis not present

## 2019-09-14 ENCOUNTER — Other Ambulatory Visit: Payer: Self-pay

## 2019-09-14 MED ORDER — ALBUTEROL SULFATE HFA 108 (90 BASE) MCG/ACT IN AERS: 2.0000 | INHALATION_SPRAY | Freq: Four times a day (QID) | RESPIRATORY_TRACT | 3 refills | Status: DC | PRN

## 2019-09-23 ENCOUNTER — Other Ambulatory Visit: Payer: Self-pay

## 2019-09-23 MED ORDER — CARVEDILOL 12.5 MG PO TABS: 12.5000 mg | ORAL_TABLET | Freq: Two times a day (BID) | ORAL | 0 refills | Status: DC

## 2019-09-23 NOTE — Telephone Encounter (Signed)
*  STAT* If patient is at the pharmacy, call can be transferred to refill team.   1. Which medications need to be refilled? (please list name of each medication and dose if known) Carvedilol  2. Which pharmacy/location (including street and city if local pharmacy) is medication to be sent to? [facility] [place]  3. Do they need a 30 day or 90 day supply? [place]

## 2019-09-30 DIAGNOSIS — M0579 Rheumatoid arthritis with rheumatoid factor of multiple sites without organ or systems involvement: Secondary | ICD-10-CM | POA: Diagnosis not present

## 2019-10-03 ENCOUNTER — Other Ambulatory Visit: Payer: Self-pay | Admitting: Cardiovascular Disease

## 2019-10-05 ENCOUNTER — Encounter: Payer: Self-pay | Admitting: Internal Medicine

## 2019-10-05 ENCOUNTER — Other Ambulatory Visit: Payer: Self-pay

## 2019-10-05 ENCOUNTER — Ambulatory Visit (INDEPENDENT_AMBULATORY_CARE_PROVIDER_SITE_OTHER): Payer: Medicare Other | Admitting: Internal Medicine

## 2019-10-05 ENCOUNTER — Other Ambulatory Visit: Payer: Self-pay | Admitting: Cardiovascular Disease

## 2019-10-05 VITALS — BP 140/78 | HR 83 | Temp 98.0°F | Ht 67.0 in | Wt 234.0 lb

## 2019-10-05 DIAGNOSIS — I70222 Atherosclerosis of native arteries of extremities with rest pain, left leg: Secondary | ICD-10-CM | POA: Diagnosis not present

## 2019-10-05 DIAGNOSIS — E1151 Type 2 diabetes mellitus with diabetic peripheral angiopathy without gangrene: Secondary | ICD-10-CM | POA: Diagnosis not present

## 2019-10-05 DIAGNOSIS — E1122 Type 2 diabetes mellitus with diabetic chronic kidney disease: Secondary | ICD-10-CM

## 2019-10-05 DIAGNOSIS — I739 Peripheral vascular disease, unspecified: Secondary | ICD-10-CM

## 2019-10-05 DIAGNOSIS — F330 Major depressive disorder, recurrent, mild: Secondary | ICD-10-CM

## 2019-10-05 DIAGNOSIS — N184 Chronic kidney disease, stage 4 (severe): Secondary | ICD-10-CM | POA: Diagnosis not present

## 2019-10-05 DIAGNOSIS — Z794 Long term (current) use of insulin: Secondary | ICD-10-CM | POA: Diagnosis not present

## 2019-10-05 LAB — POCT GLYCOSYLATED HEMOGLOBIN (HGB A1C): Hemoglobin A1C: 5.7 % — AB (ref 4.0–5.6)

## 2019-10-05 NOTE — Telephone Encounter (Signed)
Attempted to schedule.  LMOV to call office.  ° °

## 2019-10-05 NOTE — Assessment & Plan Note (Signed)
Lab Results  Component Value Date   HGBA1C 5.7 (A) 10/05/2019   Excellent control still Will continue same insulin Rx

## 2019-10-05 NOTE — Progress Notes (Signed)
Subjective:    Patient ID: [name] [name], male    DOB: 09/22/1955, 64 y.o.   MRN: [id number]  HPI Here for follow up of diabetes and other chronic health conditions This visit occurred during the SARS-CoV-2 public health emergency.  Safety protocols were in place, including screening questions prior to the visit, additional usage of staff PPE, and extensive cleaning of exam room while observing appropriate contact time as indicated for disinfecting solutions.   "I feel like crap because I am cooped up" Did get first COVID vaccine--second this week  Has been checking sugars-- 2-3 times per month Usually 135-165 Not using the insulin daily---sliding sale. Usually 15R/15N most days No hypoglycemic spells Did have stents done on legs recently  Gets occasional chest pain--usually slight tightness in AM No clear exertional symptoms Due to see Dr Fletcher Anon Not walking much due to balance issues---using cane all the time  Depression still present Mild symptoms most days Living with 2 children and a grandchild--this is stressful No suicidal ideation  Last GFR 23  Current Outpatient Medications on File Prior to Visit  Medication Sig Dispense Refill  . ACCU-CHEK FASTCLIX LANCETS MISC Use to check sugar 1 time daily. 100 each 5  . albuterol (PROAIR HFA) 108 (90 Base) MCG/ACT inhaler Inhale 2 puffs into the lungs every 6 (six) hours as needed for shortness of breath. 18 g 3  . aspirin 81 MG tablet Take 81 mg by mouth daily.    [name] buPROPion (WELLBUTRIN XL) 300 MG 24 hr tablet Take 1 tablet (300 mg total) by mouth daily. 90 tablet 3  . busPIRone (BUSPAR) 10 MG tablet TAKE 2 TABLETS(20 MG) BY MOUTH TWICE DAILY 360 tablet 3  . calcitRIOL (ROCALTROL) 0.25 MCG capsule Take 0.25 mcg by mouth 3 (three) times a week.    . carvedilol (COREG) 12.5 MG tablet Take 1 tablet (12.5 mg total) by mouth 2 (two) times daily. PLEASE SCHEDULE APPOINTMENT FOR FUTURE REFILLS. 30 tablet 0  . clopidogrel (PLAVIX) 75  MG tablet Take 1 tablet (75 mg total) by mouth daily. 90 tablet 0  . CONTOUR TEST test strip TEST EVERY DAY 100 strip 11  . Dextran 70-Hypromellose (ARTIFICIAL TEARS) 0.1-0.3 % SOLN Place 1 drop into both eyes 4 (four) times daily as needed (dry eyes).    . famotidine (PEPCID) 40 MG tablet Take 1 tablet (40 mg total) by mouth 2 (two) times daily. 180 tablet 3  . finasteride (PROSCAR) 5 MG tablet Take 5 mg by mouth daily.    . furosemide (LASIX) 20 MG tablet Take 20 mg by mouth daily.    . insulin NPH Human (NOVOLIN N RELION) 100 UNIT/ML injection Inject 0.2 mLs (20 Units total) into the skin at bedtime. (Patient taking differently: Inject 20 Units into the skin at bedtime as needed (high blood sugar). ) 10 mL 11  . insulin regular (NOVOLIN R) 100 units/mL injection Inject 20 Units into the skin See admin instructions. Per sliding scale up to 3 times daily if needed    . losartan (COZAAR) 100 MG tablet TAKE 1 TABLET(100 MG) BY MOUTH DAILY (Patient taking differently: Take 100 mg by mouth daily. ) 30 tablet 2  . Multiple Vitamin (MULTIVITAMIN) capsule Take 1 capsule by mouth daily.     . nitroGLYCERIN (NITROSTAT) 0.4 MG SL tablet Place 1 tablet (0.4 mg total) under the tongue every 5 (five) minutes as needed. 25 tablet 1  . RELION INSULIN SYRINGE 31G X 15/64" 1 ML MISC  ONE INJECTION THREE TIMES DAILY 200 each 2  . sertraline (ZOLOFT) 100 MG tablet Take 2 tablets (200 mg total) by mouth daily. 180 tablet 3  . simvastatin (ZOCOR) 20 MG tablet Take 1 tablet (20 mg total) by mouth daily at 6 PM. 90 tablet 0  . tamsulosin (FLOMAX) 0.4 MG CAPS capsule take 2 capsule by mouth daily 180 capsule 3  . [DISCONTINUED] DULoxetine (CYMBALTA) 60 MG capsule Take 1 capsule (60 mg total) by mouth 2 (two) times daily. 180 capsule 3  . Icosapent Ethyl (VASCEPA) 1 g CAPS Take 2 capsules (2 g total) by mouth daily. 120 capsule 5   No current facility-administered medications on file prior to visit.    Allergies    Allergen Reactions  . Contrast Media [Iodinated Diagnostic Agents] Rash and Other (See Comments)    Got very hot and red   . Glipizide Other (See Comments)    ANTIDIABETICS. Burning  . Metrizamide Other (See Comments)    Got very hot and red  . Penicillins Hives and Swelling    Has patient had a PCN reaction causing immediate rash, facial/tongue/throat swelling, SOB or lightheadedness with hypotension: yes Has patient had a PCN reaction causing severe rash involving mucus membranes or skin necrosis: no  Has patient had a PCN reaction that required hospitalization: yes Has patient had a PCN reaction occurring within the last 10 years: no If all of the above answers are "NO", then may proceed with Cephalosporin use.     Past Medical History:  Diagnosis Date  . Adenomatous colon polyp   . Allergy   . Angina at rest [facility])    chronic  . Anxiety   . Arthritis    RA  . Bell's palsy 2007  . Carotid artery occlusion   . Cataract    Dr. Dawna Part  . Coronary artery disease   . Depression   . Diabetes mellitus   . Diverticulosis   . Duodenitis   . DVT (deep venous thrombosis) ([facility])    in leg  . Dyspnea    DOE  . Gastropathy   . GERD (gastroesophageal reflux disease)   . Heart murmur   . Helicobacter pylori gastritis   . Hyperlipidemia   . Hypertension   . Kidney failure   . Mild aortic stenosis   . Neuropathy   . Obesity   . Peripheral vascular disease ([place])   . Post splenectomy syndrome   . Psoriasis   . Psoriatic arthritis ([place])   . Renal disorder    stage 4 kidney failure  . Sleep apnea    uses cpap  . SOB (shortness of breath)   . Stroke ([place])   . Tobacco use disorder    recently quit  . Tubular adenoma 08/2013   Dr. Hilarie Fredrickson  . Weak urinary stream     Past Surgical History:  Procedure Laterality Date  . CARDIAC CATHETERIZATION  08/2010   LAD: 80% ISR, RCA: 80% ostial, OM 80-90%  . CAROTID ENDARTERECTOMY  08/2010   left/ Dr. Kellie Simmering  . CATARACT EXTRACTION  W/PHACO Left 07/09/2017   Procedure: CATARACT EXTRACTION PHACO AND INTRAOCULAR LENS PLACEMENT (IOC);  Surgeon: Birder Robson, MD;  Location: [facility] ORS;  Service: Ophthalmology;  Laterality: Left;  [place] 00:23 AP% 14.2 CDE 3.26 Fluid pack lot # [id number]  . CATARACT EXTRACTION W/PHACO Right 09/10/2017   Procedure: CATARACT EXTRACTION PHACO AND INTRAOCULAR LENS PLACEMENT (IOC);  Surgeon: Birder Robson, MD;  Location: [facility] ORS;  Service: Ophthalmology;  Laterality: Right;  [place] 00:26.4 AP% 17.3 CDE 4.59 Fluid Pack Lot # [id number]  . COLONOSCOPY    . CORONARY ANGIOPLASTY     LAD: before CABG  . CORONARY ARTERY BYPASS GRAFT  09/06/2010   At [facility]: LIMA to LAD, left radial to RCA, sequential SVG to OM3 and 4  . heart stents  Jan 2011   leg stents 06/2009 and 03/2010  . LOWER EXTREMITY ANGIOGRAPHY Left 06/09/2019   Procedure: LOWER EXTREMITY ANGIOGRAPHY;  Surgeon: Katha Cabal, MD;  Location: [facility];  Service: Cardiovascular;  Laterality: Left;  . PTA of illiac and SFA  multiple   Dr. Ronalee Belts, s/p revision 11/2012  . SPLENECTOMY    . VASCULAR SURGERY     LEG STENTS    Family History  Problem Relation Age of Onset  . Lung cancer Father 79  . Arthritis Father   . Breast cancer Sister 5  . Alcoholism Sister   . Dementia Mother   . Alzheimer's disease Mother   . Arthritis Mother   . Alcoholism Brother   . Epilepsy Sister   . Diabetes Paternal Grandfather   . Colon polyps Paternal Grandfather 13  . Alcoholism Sister   . Alcoholism Sister   . Alcoholism Brother     Social History   Socioeconomic History  . Marital status: Widowed    Spouse name: Not on file  . Number of children: 2  . Years of education: Not on file  . Highest education level: Not on file  Occupational History  . Occupation: Chief Operating Officer at [employer]: Disabled mostly due to neuropathy  Tobacco Use  . Smoking status: Former Smoker    Packs/day: 0.75    Years: 40.00    Pack  years: 30.00    Types: Cigarettes    Quit date: 04/15/2017    Years since quitting: 2.4  . Smokeless tobacco: Never Used  . Tobacco comment: 1 cigarette a day  Substance and Sexual Activity  . Alcohol use: No  . Drug use: No  . Sexual activity: Never  Other Topics Concern  . Not on file  Social History Narrative   Wife died January 17, 2019      Has living will   Gearldine Shown and his wife Levada Dy should be his health care POA   Would accept resuscitation --but no prolonged ventilation   No tube feeds if cognitively unaware   Social Determinants of Health   Financial Resource Strain:   . Difficulty of Paying Living Expenses:   Food Insecurity: No Food Insecurity  . Worried About Charity fundraiser in the Last Year: Never true  . Ran Out of Food in the Last Year: Never true  Transportation Needs: No Transportation Needs  . Lack of Transportation (Medical): No  . Lack of Transportation (Non-Medical): No  Physical Activity:   . Days of Exercise per Week:   . Minutes of Exercise per Session:   Stress: No Stress Concern Present  . Feeling of Stress : Only a little  Social Connections: Unknown  . Frequency of Communication with Friends and Family: More than three times a week  . Frequency of Social Gatherings with Friends and Family: Not on file  . Attends Religious Services: Not on file  . Active Member of Clubs or Organizations: Not on file  . Attends Archivist Meetings: Not on file  . Marital Status: Not on file  Intimate Partner Violence: Unknown  . Fear  of Current or Ex-Partner: No  . Emotionally Abused: No  . Physically Abused: No  . Sexually Abused: Not on file   Review of Systems Sleep is variable Weight up slightly    Objective:   Physical Exam  Constitutional: He appears well-developed. No distress.  Neck: No thyromegaly present.  Cardiovascular: Normal rate and regular rhythm. Exam reveals no gallop.  Gr 3/6 systolic murmur at base Faint DP pulses in  feet only  Respiratory: Effort normal and breath sounds normal. No respiratory distress. He has no wheezes. He has no rales.  Musculoskeletal:        General: No edema.  Lymphadenopathy:    He has no cervical adenopathy.  Skin:  Slight scaling in feet No ulcers Slight scab on tip of right 2nd toe (no inflammation)  Psychiatric: He has a normal mood and affect. His behavior is normal.           Assessment & Plan:

## 2019-10-05 NOTE — Assessment & Plan Note (Signed)
Has been stable Continues to see the nephrologist

## 2019-10-05 NOTE — Assessment & Plan Note (Signed)
Chronic depression but not severe now Will continue current medications---sertraline/buspirone/duloxetine/bupropion

## 2019-10-05 NOTE — Assessment & Plan Note (Signed)
Recent procedure Feet look okay now No claudication

## 2019-10-05 NOTE — Telephone Encounter (Signed)
Please schedule overdue 6 month F/U with Dr. Fletcher Anon for refills. Thank you!

## 2019-10-20 ENCOUNTER — Telehealth: Payer: Self-pay | Admitting: Internal Medicine

## 2019-10-20 NOTE — Chronic Care Management (AMB) (Signed)
°  Chronic Care Management   Note  10/20/2019 Name: [name] [name] MRN: [id number] DOB: [birth year]  [name] [name] is a 64 y.o. year old male who is a primary care patient of [name], MD. I reached out to [name] [name] by phone today in response to a referral sent by [name]'s PCP, [name], MD.   [name] was given information about Chronic Care Management services today including:  1. CCM service includes personalized support from designated clinical staff supervised by his physician, including individualized plan of care and coordination with other care providers 2. 24/7 contact phone numbers for assistance for urgent and routine care needs. 3. Service will only be billed when office clinical staff spend 20 minutes or more in a month to coordinate care. 4. Only one practitioner may furnish and bill the service in a calendar month. 5. The patient may stop CCM services at any time (effective at the end of the month) by phone call to the office staff.   Patient agreed to services and verbal consent obtained.   Follow up plan:   Raynicia Dukes [employer] Scheduler

## 2019-10-28 ENCOUNTER — Other Ambulatory Visit: Payer: Self-pay

## 2019-10-28 ENCOUNTER — Ambulatory Visit (INDEPENDENT_AMBULATORY_CARE_PROVIDER_SITE_OTHER): Payer: Medicare Other | Admitting: Nurse Practitioner

## 2019-10-28 ENCOUNTER — Encounter (INDEPENDENT_AMBULATORY_CARE_PROVIDER_SITE_OTHER): Payer: Self-pay | Admitting: Nurse Practitioner

## 2019-10-28 ENCOUNTER — Ambulatory Visit (INDEPENDENT_AMBULATORY_CARE_PROVIDER_SITE_OTHER): Payer: Medicare Other

## 2019-10-28 VITALS — BP 151/76 | HR 75 | Ht 66.0 in | Wt 236.0 lb

## 2019-10-28 DIAGNOSIS — I739 Peripheral vascular disease, unspecified: Secondary | ICD-10-CM | POA: Diagnosis not present

## 2019-10-28 DIAGNOSIS — R296 Repeated falls: Secondary | ICD-10-CM

## 2019-10-28 DIAGNOSIS — I70222 Atherosclerosis of native arteries of extremities with rest pain, left leg: Secondary | ICD-10-CM | POA: Diagnosis not present

## 2019-10-28 DIAGNOSIS — I10 Essential (primary) hypertension: Secondary | ICD-10-CM | POA: Diagnosis not present

## 2019-10-28 NOTE — Progress Notes (Signed)
Subjective:    Patient ID: [name] [name], male    DOB: 26-Feb-1956, 64 y.o.   MRN: [id number] Chief Complaint  Patient presents with  . Follow-up    U/S Follow up    The patient returns to the office for followup and review of the noninvasive studies. There have been no interval changes in lower extremity symptoms. No interval shortening of the patient's claudication distance or development of rest pain symptoms. No new ulcers or wounds have occurred since the last visit.  The patient does however endorse having frequent falls lately.  Patient denies any loss of consciousness during his falls.  However the patient states that when he falls it is just a sudden weakness that causes him to lose control.  This is caused a profound issue with his activities of daily living as he feels that he is to confined himself to his room to ensure that he is able to get up in case of a fall.  Per the patient's estimate these falls are a frequent occurrence now.  The patient also admits having significant lower back pain which may be either due to the fall or a possible cause of the fall.  He denies any aggravating or relieving factors.   The patient denies amaurosis fugax or recent TIA symptoms. There are no recent neurological changes noted. The patient denies history of DVT, PE or superficial thrombophlebitis. The patient denies recent episodes of angina or shortness of breath.   ABI Rt=0.92 and Lt=0.92  (previous ABI's Rt=1.09 and Lt=1.05) Duplex ultrasound of the left lower extremity reveals monophasic waveforms in the tibial arteries with monophasic/biphasic waveforms in the right tibial arteries.  The patient does have good toe waveforms bilaterally.   Review of Systems  Cardiovascular:       Claudication   Musculoskeletal: Positive for back pain.  Neurological: Positive for weakness.  All other systems reviewed and are negative.      Objective:   Physical Exam Vitals reviewed.    Cardiovascular:     Rate and Rhythm: Normal rate and regular rhythm.     Pulses: Decreased pulses.  Musculoskeletal:     Right lower leg: No edema.     Left lower leg: No edema.  Neurological:     Mental Status: He is alert and oriented to person, place, and time.     Motor: Weakness present.     Gait: Gait abnormal.  Psychiatric:        Mood and Affect: Mood normal.        Behavior: Behavior normal.     BP (!) 151/76   Pulse 75   Ht 5\' 6"  (1.676 m)   Wt 236 lb (107 kg)   BMI 38.09 kg/m   Past Medical History:  Diagnosis Date  . Adenomatous colon polyp   . Allergy   . Angina at rest [facility])    chronic  . Anxiety   . Arthritis    RA  . Bell's palsy 2007  . Carotid artery occlusion   . Cataract    Dr. Dawna Part  . Coronary artery disease   . Depression   . Diabetes mellitus   . Diverticulosis   . Duodenitis   . DVT (deep venous thrombosis) (H[facility])    in leg  . Dyspnea    DOE  . Gastropathy   . GERD (gastroesophageal reflux disease)   . Heart murmur   . Helicobacter pylori gastritis   . Hyperlipidemia   . Hypertension   .  Kidney failure   . Mild aortic stenosis   . Neuropathy   . Obesity   . Peripheral vascular disease ([place])   . Post splenectomy syndrome   . Psoriasis   . Psoriatic arthritis ([place])   . Renal disorder    stage 4 kidney failure  . Sleep apnea    uses cpap  . SOB (shortness of breath)   . Stroke ([place])   . Tobacco use disorder    recently quit  . Tubular adenoma 08/2013   Dr. Hilarie Fredrickson  . Weak urinary stream     Social History   Socioeconomic History  . Marital status: Widowed    Spouse name: Not on file  . Number of children: 2  . Years of education: Not on file  . Highest education level: Not on file  Occupational History  . Occupation: Chief Operating Officer at [employer]: Disabled mostly due to neuropathy  Tobacco Use  . Smoking status: Former Smoker    Packs/day: 0.75    Years: 40.00    Pack years: 30.00    Types: Cigarettes     Quit date: 04/15/2017    Years since quitting: 2.5  . Smokeless tobacco: Never Used  . Tobacco comment: 1 cigarette a day  Substance and Sexual Activity  . Alcohol use: No  . Drug use: No  . Sexual activity: Never  Other Topics Concern  . Not on file  Social History Narrative   Wife died 01/30/2019      Has living will   Gearldine Shown and his wife Levada Dy should be his health care POA   Would accept resuscitation --but no prolonged ventilation   No tube feeds if cognitively unaware   Social Determinants of Health   Financial Resource Strain:   . Difficulty of Paying Living Expenses:   Food Insecurity: No Food Insecurity  . Worried About Charity fundraiser in the Last Year: Never true  . Ran Out of Food in the Last Year: Never true  Transportation Needs: No Transportation Needs  . Lack of Transportation (Medical): No  . Lack of Transportation (Non-Medical): No  Physical Activity:   . Days of Exercise per Week:   . Minutes of Exercise per Session:   Stress: No Stress Concern Present  . Feeling of Stress : Only a little  Social Connections: Unknown  . Frequency of Communication with Friends and Family: More than three times a week  . Frequency of Social Gatherings with Friends and Family: Not on file  . Attends Religious Services: Not on file  . Active Member of Clubs or Organizations: Not on file  . Attends Archivist Meetings: Not on file  . Marital Status: Not on file  Intimate Partner Violence: Unknown  . Fear of Current or Ex-Partner: No  . Emotionally Abused: No  . Physically Abused: No  . Sexually Abused: Not on file    Past Surgical History:  Procedure Laterality Date  . CARDIAC CATHETERIZATION  08/2010   LAD: 80% ISR, RCA: 80% ostial, OM 80-90%  . CAROTID ENDARTERECTOMY  08/2010   left/ Dr. Kellie Simmering  . CATARACT EXTRACTION W/PHACO Left 07/09/2017   Procedure: CATARACT EXTRACTION PHACO AND INTRAOCULAR LENS PLACEMENT (IOC);  Surgeon: Birder Robson, MD;  Location: [facility] ORS;  Service: Ophthalmology;  Laterality: Left;  [place] 00:23 AP% 14.2 CDE 3.26 Fluid pack lot # [id number]  . CATARACT EXTRACTION W/PHACO Right 09/10/2017   Procedure: CATARACT EXTRACTION PHACO AND INTRAOCULAR LENS  PLACEMENT (IOC);  Surgeon: Birder Robson, MD;  Location: [facility] ORS;  Service: Ophthalmology;  Laterality: Right;  [place] 00:26.4 AP% 17.3 CDE 4.59 Fluid Pack Lot # [id number]  . COLONOSCOPY    . CORONARY ANGIOPLASTY     LAD: before CABG  . CORONARY ARTERY BYPASS GRAFT  09/06/2010   At [facility]: LIMA to LAD, left radial to RCA, sequential SVG to OM3 and 4  . heart stents  Jan 2011   leg stents 06/2009 and 03/2010  . LOWER EXTREMITY ANGIOGRAPHY Left 06/09/2019   Procedure: LOWER EXTREMITY ANGIOGRAPHY;  Surgeon: Katha Cabal, MD;  Location: [facility];  Service: Cardiovascular;  Laterality: Left;  . PTA of illiac and SFA  multiple   Dr. Ronalee Belts, s/p revision 11/2012  . SPLENECTOMY    . VASCULAR SURGERY     LEG STENTS    Family History  Problem Relation Age of Onset  . Lung cancer Father 29  . Arthritis Father   . Breast cancer Sister 95  . Alcoholism Sister   . Dementia Mother   . Alzheimer's disease Mother   . Arthritis Mother   . Alcoholism Brother   . Epilepsy Sister   . Diabetes Paternal Grandfather   . Colon polyps Paternal Grandfather 72  . Alcoholism Sister   . Alcoholism Sister   . Alcoholism Brother     Allergies  Allergen Reactions  . Contrast Media [Iodinated Diagnostic Agents] Rash and Other (See Comments)    Got very hot and red   . Glipizide Other (See Comments)    ANTIDIABETICS. Burning  . Metrizamide Other (See Comments)    Got very hot and red  . Penicillins Hives and Swelling    Has patient had a PCN reaction causing immediate rash, facial/tongue/throat swelling, SOB or lightheadedness with hypotension: yes Has patient had a PCN reaction causing severe rash involving mucus membranes or skin necrosis: no    Has patient had a PCN reaction that required hospitalization: yes Has patient had a PCN reaction occurring within the last 10 years: no If all of the above answers are "NO", then may proceed with Cephalosporin use.        Assessment & Plan:   1. PVD (peripheral vascular disease) ([place])  Recommend:  The patient has evidence of atherosclerosis of the lower extremities with claudication.  The patient does not voice lifestyle limiting changes at this point in time.  Noninvasive studies do not suggest clinically significant change.  No invasive studies, angiography or surgery at this time The patient should continue walking and begin a more formal exercise program.  The patient should continue antiplatelet therapy and aggressive treatment of the lipid abnormalities  No changes in the patient's medications at this time  The patient should continue wearing graduated compression socks 10-15 mmHg strength to control the mild edema.   We will continue to follow the patient closely due to his other issues.  We will have the patient follow-up in 3 months with noninvasive studies  2. Frequent falls The patient describes having frequent falls recently.  The patient states that he does have subtle weakness in his legs give out from under him.  He has some significant pain near his lower back which he describes as tailbone area.  The patient also has some gait issues as well.  Based on this we will refer to neurosurgery to determine if there is any spinal component to the patient's falling. - Ambulatory referral to Neurosurgery  3. Essential hypertension  Continue antihypertensive medications as already ordered, these medications have been reviewed and there are no changes at this time.    Current Outpatient Medications on File Prior to Visit  Medication Sig Dispense Refill  . ACCU-CHEK FASTCLIX LANCETS MISC Use to check sugar 1 time daily. 100 each 5  . albuterol (PROAIR HFA) 108 (90 Base)  MCG/ACT inhaler Inhale 2 puffs into the lungs every 6 (six) hours as needed for shortness of breath. 18 g 3  . aspirin 81 MG tablet Take 81 mg by mouth daily.    [name] buPROPion (WELLBUTRIN XL) 300 MG 24 hr tablet Take 1 tablet (300 mg total) by mouth daily. 90 tablet 3  . busPIRone (BUSPAR) 10 MG tablet TAKE 2 TABLETS(20 MG) BY MOUTH TWICE DAILY 360 tablet 3  . calcitRIOL (ROCALTROL) 0.25 MCG capsule Take 0.25 mcg by mouth 3 (three) times a week.    . carvedilol (COREG) 12.5 MG tablet Take 1 tablet (12.5 mg total) by mouth 2 (two) times daily. PLEASE SCHEDULE APPOINTMENT FOR FUTURE REFILLS. 30 tablet 0  . clopidogrel (PLAVIX) 75 MG tablet TAKE 1 TABLET(75 MG) BY MOUTH DAILY 90 tablet 0  . CONTOUR TEST test strip TEST EVERY DAY 100 strip 11  . cyclobenzaprine (FLEXERIL) 10 MG tablet Take by mouth.    . Dextran 70-Hypromellose (ARTIFICIAL TEARS) 0.1-0.3 % SOLN Place 1 drop into both eyes 4 (four) times daily as needed (dry eyes).    . finasteride (PROSCAR) 5 MG tablet Take 5 mg by mouth daily.    . furosemide (LASIX) 20 MG tablet Take 20 mg by mouth daily.    . hydroxychloroquine (PLAQUENIL) 200 MG tablet Comments:   Filled Date: Feb 19 2019 12:00AM  Patient Notes: TK 1 T PO BID  Duration: 90    . Icosapent Ethyl (VASCEPA) 1 g CAPS Take 2 capsules (2 g total) by mouth daily. 120 capsule 5  . insulin NPH Human (NOVOLIN N RELION) 100 UNIT/ML injection Inject 0.2 mLs (20 Units total) into the skin at bedtime. (Patient taking differently: Inject 20 Units into the skin at bedtime as needed (high blood sugar). ) 10 mL 11  . insulin regular (NOVOLIN R) 100 units/mL injection Inject 20 Units into the skin See admin instructions. Per sliding scale up to 3 times daily if needed    . losartan (COZAAR) 100 MG tablet TAKE 1 TABLET(100 MG) BY MOUTH DAILY (Patient taking differently: Take 100 mg by mouth daily. ) 30 tablet 2  . Multiple Vitamin (MULTIVITAMIN) capsule Take 1 capsule by mouth daily.     .  nitroGLYCERIN (NITROSTAT) 0.4 MG SL tablet Place 1 tablet (0.4 mg total) under the tongue every 5 (five) minutes as needed. 25 tablet 1  . RELION INSULIN SYRINGE 31G X 15/64" 1 ML MISC ONE INJECTION THREE TIMES DAILY 200 each 2  . sertraline (ZOLOFT) 100 MG tablet Take 2 tablets (200 mg total) by mouth daily. 180 tablet 3  . simvastatin (ZOCOR) 20 MG tablet Take 1 tablet (20 mg total) by mouth daily at 6 PM. 90 tablet 0  . tamsulosin (FLOMAX) 0.4 MG CAPS capsule take 2 capsule by mouth daily 180 capsule 3  . DULoxetine (CYMBALTA) 60 MG capsule Take 60 mg by mouth daily.    . famotidine (PEPCID) 40 MG tablet Take 1 tablet (40 mg total) by mouth 2 (two) times daily. (Patient not taking: Reported on 10/28/2019) 180 tablet 3   No current facility-administered medications on file prior to visit.  There are no Patient Instructions on file for this visit. No follow-ups on file.   Kris Hartmann, NP

## 2019-11-03 ENCOUNTER — Encounter: Payer: Self-pay | Admitting: Cardiovascular Disease

## 2019-11-03 ENCOUNTER — Other Ambulatory Visit: Payer: Self-pay

## 2019-11-03 ENCOUNTER — Ambulatory Visit (INDEPENDENT_AMBULATORY_CARE_PROVIDER_SITE_OTHER): Payer: Medicare Other | Admitting: Cardiovascular Disease

## 2019-11-03 VITALS — BP 140/84 | HR 75 | Ht 68.0 in | Wt 235.0 lb

## 2019-11-03 DIAGNOSIS — I25118 Atherosclerotic heart disease of native coronary artery with other forms of angina pectoris: Secondary | ICD-10-CM | POA: Diagnosis not present

## 2019-11-03 DIAGNOSIS — I10 Essential (primary) hypertension: Secondary | ICD-10-CM

## 2019-11-03 DIAGNOSIS — E782 Mixed hyperlipidemia: Secondary | ICD-10-CM

## 2019-11-03 DIAGNOSIS — I359 Nonrheumatic aortic valve disorder, unspecified: Secondary | ICD-10-CM | POA: Diagnosis not present

## 2019-11-03 DIAGNOSIS — I6529 Occlusion and stenosis of unspecified carotid artery: Secondary | ICD-10-CM

## 2019-11-03 DIAGNOSIS — I70222 Atherosclerosis of native arteries of extremities with rest pain, left leg: Secondary | ICD-10-CM | POA: Diagnosis not present

## 2019-11-03 NOTE — Patient Instructions (Signed)
Medication Instructions:  Your physician recommends that you continue on your current medications as directed. Please refer to the Current Medication list given to you today.  *If you need a refill on your cardiac medications before your next appointment, please call your pharmacy*   Lab Work: None ordered If you have labs (blood work) drawn today and your tests are completely normal, you will receive your results only by: [name] MyChart Message (if you have MyChart) OR . A paper copy in the mail If you have any lab test that is abnormal or we need to change your treatment, we will call you to review the results.   Testing/Procedures: Your physician has requested that you have a carotid duplex. This test is an ultrasound of the carotid arteries in your neck. It looks at blood flow through these arteries that supply the brain with blood. Allow one hour for this exam. There are no restrictions or special instructions. (To be scheduled in June 2021)  Your physician has requested that you have an echocardiogram. Echocardiography is a painless test that uses sound waves to create images of your heart. It provides your doctor with information about the size and shape of your heart and how well your heart's chambers and valves are working. This procedure takes approximately one hour. There are no restrictions for this procedure. (To be scheduled in June 2021)    Follow-Up: At [facility], you and your health needs are our priority.  As part of our continuing mission to provide you with exceptional heart care, we have created designated Provider Care Teams.  These Care Teams include your primary Cardiologist (physician) and Advanced Practice Providers (APPs -  Physician Assistants and Nurse Practitioners) who all work together to provide you with the care you need, when you need it.  We recommend signing up for the patient portal called "MyChart".  Sign up information is provided on this After Visit  Summary.  MyChart is used to connect with patients for Virtual Visits (Telemedicine).  Patients are able to view lab/test results, encounter notes, upcoming appointments, etc.  Non-urgent messages can be sent to your provider as well.   To learn more about what you can do with MyChart, go to [email or web address].    Your next appointment:   6 month(s)  The format for your next appointment:   In Person  Provider:    You may see Dr.Arida or one of the following Advanced Practice Providers on your designated Care Team:    Murray Hodgkins, NP  Christell Faith, PA-C  Marrianne Mood, PA-C    Other Instructions N/A

## 2019-11-03 NOTE — Progress Notes (Signed)
Cardiology Office Note   Date:  11/03/2019   ID:  [name] [name], DOB 21-Aug-1955, MRN [id number]  PCP:  [name], MD  Cardiologist:   [name], MD   Chief Complaint  Patient presents with  . other    6 month follow up and discuss Echo results. Meds reviewed by the pt. verbally. Pt. c/o shortness of breath.       History of Present Illness: [name] [name] is a 64 y.o. male who presents for a follow-up visit regarding coronary artery disease and aortic stenosis. He is status post CABG in 2012 for 3 vessel coronary artery disease. He has extensive medical problems that include carotid artery disease status post left carotid endarterectomy , aortic stenosis, tobacco use, obesity, psoriasis, type 2 diabetes, hypertension, hyperlipidemia and peripheral arterial disease which is being followed by Dr. Ronalee Belts. Most recent stress test was done in of 2013 which showed no evidence of ischemia.  Most recent echocardiogram in June 2020 showed normal LV systolic function with moderate aortic stenosis and mildly dilated aortic root at 4 cm.  Aortic valve area was 1.28 cm.  Unfortunately, his wife died last year of pancreatic cancer.  The patient has been depressed since then.  He had extensive lower extremity revascularization done in November by Dr. Delana Meyer.  The patient's kidney function has gradually deteriorated with most recent creatinine of 3.3.  He is followed by Dr. [name]. He denies chest pain.  He does have exertional dyspnea.  He takes his medications regularly.  Past Medical History:  Diagnosis Date  . Adenomatous colon polyp   . Allergy   . Angina at [facility])    chronic  . Anxiety   . Arthritis    RA  . Bell's palsy 2007  . Carotid artery occlusion   . Cataract    Dr. Dawna Part  . Coronary artery disease   . Depression   . Diabetes mellitus   . Diverticulosis   . Duodenitis   . DVT (deep venous thrombosis) (HCC)    in leg  . Dyspnea    DOE  .  Gastropathy   . GERD (gastroesophageal reflux disease)   . Heart murmur   . Helicobacter pylori gastritis   . Hyperlipidemia   . Hypertension   . Kidney failure   . Mild aortic stenosis   . Neuropathy   . Obesity   . Peripheral vascular disease ([place])   . Post splenectomy syndrome   . Psoriasis   . Psoriatic arthritis ([place])   . Renal disorder    stage 4 kidney failure  . Sleep apnea    uses cpap  . SOB (shortness of breath)   . Stroke ([place])   . Tobacco use disorder    recently quit  . Tubular adenoma 08/2013   Dr. Hilarie Fredrickson  . Weak urinary stream     Past Surgical History:  Procedure Laterality Date  . CARDIAC CATHETERIZATION  08/2010   LAD: 80% ISR, RCA: 80% ostial, OM 80-90%  . CAROTID ENDARTERECTOMY  08/2010   left/ Dr. Kellie Simmering  . CATARACT EXTRACTION W/PHACO Left 07/09/2017   Procedure: CATARACT EXTRACTION PHACO AND INTRAOCULAR LENS PLACEMENT (IOC);  Surgeon: Birder Robson, MD;  Location: [facility] ORS;  Service: Ophthalmology;  Laterality: Left;  [place] 00:23 AP% 14.2 CDE 3.26 Fluid pack lot # [id number]  . CATARACT EXTRACTION W/PHACO Right 09/10/2017   Procedure: CATARACT EXTRACTION PHACO AND INTRAOCULAR LENS PLACEMENT (IOC);  Surgeon: Birder Robson, MD;  Location:  [facility] ORS;  Service: Ophthalmology;  Laterality: Right;  [place] 00:26.4 AP% 17.3 CDE 4.59 Fluid Pack Lot # [id number]  . COLONOSCOPY    . CORONARY ANGIOPLASTY     LAD: before CABG  . CORONARY ARTERY BYPASS GRAFT  09/06/2010   At [facility]: LIMA to LAD, left radial to RCA, sequential SVG to OM3 and 4  . heart stents  Jan 2011   leg stents 06/2009 and 03/2010  . LOWER EXTREMITY ANGIOGRAPHY Left 06/09/2019   Procedure: LOWER EXTREMITY ANGIOGRAPHY;  Surgeon: Katha Cabal, MD;  Location: [facility];  Service: Cardiovascular;  Laterality: Left;  . PTA of illiac and SFA  multiple   Dr. Ronalee Belts, s/p revision 11/2012  . SPLENECTOMY    . VASCULAR SURGERY     LEG STENTS     Current Outpatient Medications    Medication Sig Dispense Refill  . ACCU-CHEK FASTCLIX LANCETS MISC Use to check sugar 1 time daily. 100 each 5  . albuterol (PROAIR HFA) 108 (90 Base) MCG/ACT inhaler Inhale 2 puffs into the lungs every 6 (six) hours as needed for shortness of breath. 18 g 3  . aspirin 81 MG tablet Take 81 mg by mouth daily.    [name] buPROPion (WELLBUTRIN XL) 300 MG 24 hr tablet Take 1 tablet (300 mg total) by mouth daily. 90 tablet 3  . busPIRone (BUSPAR) 10 MG tablet TAKE 2 TABLETS(20 MG) BY MOUTH TWICE DAILY 360 tablet 3  . calcitRIOL (ROCALTROL) 0.25 MCG capsule Take 0.25 mcg by mouth 3 (three) times a week.    . carvedilol (COREG) 12.5 MG tablet Take 1 tablet (12.5 mg total) by mouth 2 (two) times daily. PLEASE SCHEDULE APPOINTMENT FOR FUTURE REFILLS. 30 tablet 0  . clopidogrel (PLAVIX) 75 MG tablet TAKE 1 TABLET(75 MG) BY MOUTH DAILY 90 tablet 0  . CONTOUR TEST test strip TEST EVERY DAY 100 strip 11  . cyclobenzaprine (FLEXERIL) 10 MG tablet Take by mouth.    . Dextran 70-Hypromellose (ARTIFICIAL TEARS) 0.1-0.3 % SOLN Place 1 drop into both eyes 4 (four) times daily as needed (dry eyes).    . finasteride (PROSCAR) 5 MG tablet Take 5 mg by mouth daily.    . furosemide (LASIX) 20 MG tablet Take 20 mg by mouth daily.    . hydroxychloroquine (PLAQUENIL) 200 MG tablet Comments:   Filled Date: Feb 19 2019 12:00AM  Patient Notes: TK 1 T PO BID  Duration: 90    . insulin NPH Human (NOVOLIN N RELION) 100 UNIT/ML injection Inject 0.2 mLs (20 Units total) into the skin at bedtime. (Patient taking differently: Inject 20 Units into the skin at bedtime as needed (high blood sugar). ) 10 mL 11  . insulin regular (NOVOLIN R) 100 units/mL injection Inject 20 Units into the skin See admin instructions. Per sliding scale up to 3 times daily if needed    . losartan (COZAAR) 100 MG tablet TAKE 1 TABLET(100 MG) BY MOUTH DAILY (Patient taking differently: Take 100 mg by mouth daily. ) 30 tablet 2  . Multiple Vitamin (MULTIVITAMIN)  capsule Take 1 capsule by mouth daily.     . nitroGLYCERIN (NITROSTAT) 0.4 MG SL tablet Place 1 tablet (0.4 mg total) under the tongue every 5 (five) minutes as needed. 25 tablet 1  . RELION INSULIN SYRINGE 31G X 15/64" 1 ML MISC ONE INJECTION THREE TIMES DAILY 200 each 2  . sertraline (ZOLOFT) 100 MG tablet Take 2 tablets (200 mg total) by mouth daily. 180 tablet  3  . simvastatin (ZOCOR) 20 MG tablet Take 1 tablet (20 mg total) by mouth daily at 6 PM. 90 tablet 0  . tamsulosin (FLOMAX) 0.4 MG CAPS capsule take 2 capsule by mouth daily 180 capsule 3   No current facility-administered medications for this visit.    Allergies:   Contrast media [iodinated diagnostic agents], Glipizide, Metrizamide, and Penicillins    Social History:  The patient  reports that he quit smoking about 2 years ago. His smoking use included cigarettes. He has a 30.00 pack-year smoking history. He has never used smokeless tobacco. He reports that he does not drink alcohol or use drugs.   Family History:  The patient's family history includes Alcoholism in his brother, brother, sister, sister, and sister; Alzheimer's disease in his mother; Arthritis in his father and mother; Breast cancer (age of onset: 61) in his sister; Colon polyps (age of onset: 55) in his paternal grandfather; Dementia in his mother; Diabetes in his paternal grandfather; Epilepsy in his sister; Lung cancer (age of onset: 60) in his father.    ROS:  Please see the history of present illness.   Otherwise, review of systems are positive for none.   All other systems are reviewed and negative.    PHYSICAL EXAM: VS:  BP 140/84 (BP Location: Left Arm, Patient Position: Sitting, Cuff Size: Normal)   Pulse 75   Ht 5\' 8"  (1.727 m)   Wt 235 lb (106.6 kg)   SpO2 98%   BMI 35.73 kg/m  , BMI Body mass index is 35.73 kg/m. GEN: Well nourished, well developed, in no acute distress  HEENT: normal  Neck: no JVD, or masses. Bilateral carotid bruits Cardiac:  RRR; no rubs, or gallops,no edema . 3/6 crescendo decrescendo systolic murmur in the aortic area which is mid/late peaking with diminished S2 Respiratory:  clear to auscultation bilaterally, normal work of breathing GI: soft, nontender, nondistended, + BS MS: no deformity or atrophy  Skin: warm and dry, no rash Neuro:  Strength and sensation are intact Psych: euthymic mood, full affect   EKG:  EKG is ordered today. EKG showed normal sinus rhythm with poor R wave progression in the anterior leads .  No significant ST or T wave changes.   Recent Labs: 04/07/2019: ALT 12; ALT 12; Hemoglobin 13.1; Platelets 167.0; Potassium 5.3; Potassium 5.3; Sodium 137; Sodium 137 06/09/2019: BUN 45; Creatinine, Ser 2.76    Lipid Panel    Component Value Date/Time   CHOL 81 04/07/2019 1201   CHOL 116 11/18/2017 1002   TRIG 103.0 04/07/2019 1201   HDL 26.80 (L) 04/07/2019 1201   HDL 30 (L) 11/18/2017 1002   CHOLHDL 3 04/07/2019 1201   VLDL 20.6 04/07/2019 1201   LDLCALC 34 04/07/2019 1201   LDLCALC 51 11/18/2017 1002   LDLDIRECT 61.0 03/15/2017 1021      Wt Readings from Last 3 Encounters:  11/03/19 235 lb (106.6 kg)  10/28/19 236 lb (107 kg)  10/05/19 234 lb (106.1 kg)       No flowsheet data found.    ASSESSMENT AND PLAN:  1.  Coronary artery disease involving native coronary arteries with other forms of angina: The patient has chronic exertional dyspnea without chest pain.  Symptoms have been stable.  Continue medical therapy.   He is on long-term dual antiplatelet therapy due to extensive generalized atherosclerosis (CAD, PAD and carotid disease).   2. Moderate aortic stenosis: I requested a follow-up echocardiogram to be done in June.  3.  Bilateral carotid disease status post left carotid endarterectomy.   He is due for follow-up carotid Doppler which was requested.  4. Essential hypertension:   Blood pressure is reasonably controlled on current medications.  5.  Hyperlipidemia: Continue treatment with simvastatin.  I reviewed most recent lipid profile which showed an LDL of 34 and triglyceride of 103.   Disposition:   FU with me in 6 months  Signed,  [name], MD  11/03/2019 2:14 PM    [facility]

## 2019-11-04 ENCOUNTER — Encounter: Payer: Self-pay | Admitting: Emergency Medicine

## 2019-11-04 ENCOUNTER — Other Ambulatory Visit: Payer: Self-pay

## 2019-11-04 ENCOUNTER — Emergency Department
Admission: EM | Admit: 2019-11-04 | Discharge: 2019-11-04 | Disposition: A | Discharge status: Home / Self Care | Payer: Medicare Other | Attending: Emergency Medicine | Admitting: Emergency Medicine

## 2019-11-04 ENCOUNTER — Emergency Department: Payer: Medicare Other

## 2019-11-04 DIAGNOSIS — W19XXXA Unspecified fall, initial encounter: Secondary | ICD-10-CM

## 2019-11-04 DIAGNOSIS — R410 Disorientation, unspecified: Secondary | ICD-10-CM | POA: Insufficient documentation

## 2019-11-04 DIAGNOSIS — I129 Hypertensive chronic kidney disease with stage 1 through stage 4 chronic kidney disease, or unspecified chronic kidney disease: Secondary | ICD-10-CM | POA: Insufficient documentation

## 2019-11-04 DIAGNOSIS — Z79899 Other long term (current) drug therapy: Secondary | ICD-10-CM | POA: Insufficient documentation

## 2019-11-04 DIAGNOSIS — E1122 Type 2 diabetes mellitus with diabetic chronic kidney disease: Secondary | ICD-10-CM | POA: Insufficient documentation

## 2019-11-04 DIAGNOSIS — Z7982 Long term (current) use of aspirin: Secondary | ICD-10-CM | POA: Diagnosis not present

## 2019-11-04 DIAGNOSIS — Z87891 Personal history of nicotine dependence: Secondary | ICD-10-CM | POA: Diagnosis not present

## 2019-11-04 DIAGNOSIS — R531 Weakness: Secondary | ICD-10-CM | POA: Diagnosis not present

## 2019-11-04 DIAGNOSIS — Z794 Long term (current) use of insulin: Secondary | ICD-10-CM | POA: Insufficient documentation

## 2019-11-04 DIAGNOSIS — S199XXA Unspecified injury of neck, initial encounter: Secondary | ICD-10-CM | POA: Diagnosis not present

## 2019-11-04 DIAGNOSIS — R296 Repeated falls: Secondary | ICD-10-CM | POA: Diagnosis not present

## 2019-11-04 DIAGNOSIS — M25511 Pain in right shoulder: Secondary | ICD-10-CM | POA: Diagnosis not present

## 2019-11-04 DIAGNOSIS — S0990XA Unspecified injury of head, initial encounter: Secondary | ICD-10-CM | POA: Diagnosis not present

## 2019-11-04 DIAGNOSIS — I2510 Atherosclerotic heart disease of native coronary artery without angina pectoris: Secondary | ICD-10-CM | POA: Diagnosis not present

## 2019-11-04 DIAGNOSIS — N184 Chronic kidney disease, stage 4 (severe): Secondary | ICD-10-CM | POA: Insufficient documentation

## 2019-11-04 DIAGNOSIS — S3993XA Unspecified injury of pelvis, initial encounter: Secondary | ICD-10-CM | POA: Diagnosis not present

## 2019-11-04 LAB — COMPREHENSIVE METABOLIC PANEL
ALT: 15 U/L (ref 0–44)
AST: 16 U/L (ref 15–41)
Albumin: 4.3 g/dL (ref 3.5–5.0)
Alkaline Phosphatase: 94 U/L (ref 38–126)
Anion gap: 12 (ref 5–15)
BUN: 44 mg/dL — ABNORMAL HIGH (ref 8–23)
CO2: 22 mmol/L (ref 22–32)
Calcium: 8.9 mg/dL (ref 8.9–10.3)
Chloride: 103 mmol/L (ref 98–111)
Creatinine, Ser: 3.62 mg/dL — ABNORMAL HIGH (ref 0.61–1.24)
GFR calc Af Amer: 19 mL/min — ABNORMAL LOW (ref >60)
GFR calc non Af Amer: 16 mL/min — ABNORMAL LOW (ref >60)
Glucose, Bld: 110 mg/dL — ABNORMAL HIGH (ref 70–99)
Potassium: 4.7 mmol/L (ref 3.5–5.1)
Sodium: 137 mmol/L (ref 135–145)
Total Bilirubin: 1 mg/dL (ref 0.3–1.2)
Total Protein: 8.4 g/dL — ABNORMAL HIGH (ref 6.5–8.1)

## 2019-11-04 LAB — URINALYSIS, COMPLETE (UACMP) WITH MICROSCOPIC
Bacteria, UA: NONE SEEN
Bilirubin Urine: NEGATIVE
Glucose, UA: NEGATIVE mg/dL
Hgb urine dipstick: NEGATIVE
Ketones, ur: NEGATIVE mg/dL
Leukocytes,Ua: NEGATIVE
Nitrite: NEGATIVE
Protein, ur: >=300 mg/dL — AB
Specific Gravity, Urine: 1.01 (ref 1.005–1.030)
Squamous Epithelial / HPF: NONE SEEN (ref 0–5)
pH: 6 (ref 5.0–8.0)

## 2019-11-04 LAB — CBC
HCT: 36.7 % — ABNORMAL LOW (ref 39.0–52.0)
Hemoglobin: 11.9 g/dL — ABNORMAL LOW (ref 13.0–17.0)
MCH: 31.1 pg (ref 26.0–34.0)
MCHC: 32.4 g/dL (ref 30.0–36.0)
MCV: 95.8 fL (ref 80.0–100.0)
Platelets: 143 10*3/uL — ABNORMAL LOW (ref 150–400)
RBC: 3.83 MIL/uL — ABNORMAL LOW (ref 4.22–5.81)
RDW: 14.6 % (ref 11.5–15.5)
WBC: 8.7 10*3/uL (ref 4.0–10.5)
nRBC: 0 % (ref 0.0–0.2)

## 2019-11-04 MED ORDER — SODIUM CHLORIDE 0.9 % IV BOLUS
1000.0000 mL | Freq: Once | INTRAVENOUS | Status: AC
  Administered 2019-11-04: 18:00:00 1000 mL via INTRAVENOUS

## 2019-11-04 NOTE — ED Provider Notes (Signed)
[facility] Emergency Department Provider Note  Time seen: 5:42 PM  I have reviewed the triage vital signs and the nursing notes.   HISTORY  Chief Complaint Fall   HPI [name] [name] is a 64 y.o. male with a past medical history of allergies, anxiety, Bell's palsy, CAD, diabetes, depression, neuropathy, CKD, prior CVA, presents to the emergency department for generalized weakness and falls.  According to the daughter patient has a long history of weakness and has been falling, somewhat more often recently.  Patient did have a fall last night states he fell on his buttocks and is having pain in his buttocks and his right shoulder.  Patient states he believes he is falling because of his worsening neuropathy.  Patient is on insulin, but states he has only been checking his blood sugar once daily.  Admits he has not been drinking as much as he should.  Daughter is somewhat frustrated with the patient who believes he is not caring for himself enough.  No chest pain or abdominal pain, vomiting or diarrhea.  No fever.  Past Medical History:  Diagnosis Date  . Adenomatous colon polyp   . Allergy   . Angina at rest [facility])    chronic  . Anxiety   . Arthritis    RA  . Bell's palsy 2007  . Carotid artery occlusion   . Cataract    Dr. Dawna Part  . Coronary artery disease   . Depression   . Diabetes mellitus   . Diverticulosis   . Duodenitis   . DVT (deep venous thrombosis) (HCC)    in leg  . Dyspnea    DOE  . Gastropathy   . GERD (gastroesophageal reflux disease)   . Heart murmur   . Helicobacter pylori gastritis   . Hyperlipidemia   . Hypertension   . Kidney failure   . Mild aortic stenosis   . Neuropathy   . Obesity   . Peripheral vascular disease ([place])   . Post splenectomy syndrome   . Psoriasis   . Psoriatic arthritis ([place])   . Renal disorder    stage 4 kidney failure  . Sleep apnea    uses cpap  . SOB (shortness of breath)   . Stroke ([place])   .  Tobacco use disorder    recently quit  . Tubular adenoma 08/2013   Dr. Hilarie Fredrickson  . Weak urinary stream     Patient Active Problem List   Diagnosis Date Noted  . Benign hypertensive kidney disease with chronic kidney disease 09/02/2019  . Proteinuria 09/02/2019  . Secondary hyperparathyroidism of renal origin ([place]) 09/02/2019  . Left arm pain 02/11/2019  . Gastroesophageal reflux 03/25/2018  . Atherosclerosis of aorta ([place]) 09/04/2017  . Thoracic aortic aneurysm ([place]) 09/04/2017  . Right thyroid nodule 04/12/2017  . Thrombocytopenia ([place]) 03/16/2017  . CKD stage 4 due to type 2 diabetes mellitus ([place]) 03/15/2017  . Advance directive discussed with patient 03/15/2017  . BMI 40.0-44.9, adult ([place]) 09/12/2016  . Psoriatic arthritis ([place])   . Lung nodule < 6cm on CT 03/29/2016  . COPD with acute bronchitis ([place]) 03/29/2016  . Headache 09/14/2015  . Constipation 10/05/2014  . PVD (peripheral vascular disease) ([place]) 04/23/2014  . Memory loss 04/23/2014  . Aortic valve disorder 01/11/2014  . Preventative health care 11/23/2013  . Benign prostatic hypertrophy without urinary obstruction 03/25/2013  . Enlarged prostate without lower urinary tract symptoms (luts) 03/25/2013  . Peripheral vascular disease due to secondary  diabetes mellitus ([place]) 03/25/2013  . Obesity 02/12/2013  . Coronary atherosclerosis 06/06/2012  . Nonrheumatic aortic valve stenosis   . Low back pain 12/27/2011  . MDD (major depressive disorder), recurrent episode ([place]) 05/28/2011  . Carotid artery disease ([place]) 04/17/2011  . Occlusion and stenosis of carotid artery 04/17/2011  . Sleep apnea 03/05/2011  . Psoriasis 03/05/2011  . Neuropathy ([place]) 03/05/2011  . Type 2 diabetes mellitus with diabetic peripheral angiopathy without gangrene ([place]) 03/05/2011  . Polyneuropathy 03/05/2011  . Coronary artery disease   . Hyperlipidemia, mixed   . Essential hypertension     Past Surgical History:  Procedure Laterality  Date  . CARDIAC CATHETERIZATION  08/2010   LAD: 80% ISR, RCA: 80% ostial, OM 80-90%  . CAROTID ENDARTERECTOMY  08/2010   left/ Dr. Kellie Simmering  . CATARACT EXTRACTION W/PHACO Left 07/09/2017   Procedure: CATARACT EXTRACTION PHACO AND INTRAOCULAR LENS PLACEMENT (IOC);  Surgeon: Birder Robson, MD;  Location: [facility] ORS;  Service: Ophthalmology;  Laterality: Left;  [place] 00:23 AP% 14.2 CDE 3.26 Fluid pack lot # [id number]  . CATARACT EXTRACTION W/PHACO Right 09/10/2017   Procedure: CATARACT EXTRACTION PHACO AND INTRAOCULAR LENS PLACEMENT (IOC);  Surgeon: Birder Robson, MD;  Location: [facility] ORS;  Service: Ophthalmology;  Laterality: Right;  [place] 00:26.4 AP% 17.3 CDE 4.59 Fluid Pack Lot # [id number]  . COLONOSCOPY    . CORONARY ANGIOPLASTY     LAD: before CABG  . CORONARY ARTERY BYPASS GRAFT  09/06/2010   At [facility]: LIMA to LAD, left radial to RCA, sequential SVG to OM3 and 4  . heart stents  Jan 2011   leg stents 06/2009 and 03/2010  . LOWER EXTREMITY ANGIOGRAPHY Left 06/09/2019   Procedure: LOWER EXTREMITY ANGIOGRAPHY;  Surgeon: Katha Cabal, MD;  Location: [place] [facility];  Service: Cardiovascular;  Laterality: Left;  . PTA of illiac and SFA  multiple   Dr. Ronalee Belts, s/p revision 11/2012  . SPLENECTOMY    . VASCULAR SURGERY     LEG STENTS    Prior to Admission medications   Medication Sig Start Date End Date Taking? Authorizing Provider  ACCU-CHEK FASTCLIX LANCETS MISC Use to check sugar 1 time daily. 06/13/18   Philemon Kingdom, MD  albuterol (PROAIR HFA) 108 (90 Base) MCG/ACT inhaler Inhale 2 puffs into the lungs every 6 (six) hours as needed for shortness of breath. 09/14/19   [name], MD  aspirin 81 MG tablet Take 81 mg by mouth daily.    [provider]  buPROPion (WELLBUTRIN XL) 300 MG 24 hr tablet Take 1 tablet (300 mg total) by mouth daily. 12/23/18   [name], MD  busPIRone (BUSPAR) 10 MG tablet TAKE 2 TABLETS(20 MG) BY MOUTH TWICE DAILY 04/07/19    Viviana Simpler I, MD  calcitRIOL (ROCALTROL) 0.25 MCG capsule Take 0.25 mcg by mouth 3 (three) times a week.    [provider]  carvedilol (COREG) 12.5 MG tablet Take 1 tablet (12.5 mg total) by mouth 2 (two) times daily. PLEASE SCHEDULE APPOINTMENT FOR FUTURE REFILLS. 09/23/19   Wellington [place], MD  clopidogrel (PLAVIX) 75 MG tablet TAKE 1 TABLET(75 MG) BY MOUTH DAILY 10/05/19   Wellington [place], MD  CONTOUR TEST test strip TEST EVERY DAY 01/22/19   Philemon Kingdom, MD  cyclobenzaprine (FLEXERIL) 10 MG tablet Take by mouth. 09/02/19   [provider]  Dextran 70-Hypromellose (ARTIFICIAL TEARS) 0.1-0.3 % SOLN Place 1 drop into both eyes 4 (four) times daily as needed (dry  eyes).    [provider]  finasteride (PROSCAR) 5 MG tablet Take 5 mg by mouth daily.    [provider]  furosemide (LASIX) 20 MG tablet Take 20 mg by mouth daily.    [provider]  hydroxychloroquine (PLAQUENIL) 200 MG tablet Comments:   Filled Date: Feb 19 2019 12:00AM  Patient Notes: TK 1 T PO BID  Duration: 90 02/19/19   [provider]  insulin NPH Human (NOVOLIN N RELION) 100 UNIT/ML injection Inject 0.2 mLs (20 Units total) into the skin at bedtime. Patient taking differently: Inject 20 Units into the skin at bedtime as needed (high blood sugar).  12/26/18   Philemon Kingdom, MD  insulin regular (NOVOLIN R) 100 units/mL injection Inject 20 Units into the skin See admin instructions. Per sliding scale up to 3 times daily if needed    [provider]  losartan (COZAAR) 100 MG tablet TAKE 1 TABLET(100 MG) BY MOUTH DAILY Patient taking differently: Take 100 mg by mouth daily.  03/21/18   Wellington [place], MD  Multiple Vitamin (MULTIVITAMIN) capsule Take 1 capsule by mouth daily.     [provider]  nitroGLYCERIN (NITROSTAT) 0.4 MG SL tablet Place 1 tablet (0.4 mg total) under the tongue every 5 (five) minutes as needed. 06/24/12   Wellington [place], MD   RELION INSULIN SYRINGE 31G X 15/64" 1 ML MISC ONE INJECTION THREE TIMES DAILY 01/20/18   Philemon Kingdom, MD  sertraline (ZOLOFT) 100 MG tablet Take 2 tablets (200 mg total) by mouth daily. 04/07/19 04/06/20  [name], MD  simvastatin (ZOCOR) 20 MG tablet Take 1 tablet (20 mg total) by mouth daily at 6 PM. 08/20/19   Wellington [place], MD  tamsulosin [facility]) 0.4 MG CAPS capsule take 2 capsule by mouth daily 04/07/19   [name], MD    Allergies  Allergen Reactions  . Contrast Media [Iodinated Diagnostic Agents] Rash and Other (See Comments)    Got very hot and red   . Glipizide Other (See Comments)    ANTIDIABETICS. Burning  . Metrizamide Other (See Comments)    Got very hot and red  . Penicillins Hives and Swelling    Has patient had a PCN reaction causing immediate rash, facial/tongue/throat swelling, SOB or lightheadedness with hypotension: yes Has patient had a PCN reaction causing severe rash involving mucus membranes or skin necrosis: no  Has patient had a PCN reaction that required hospitalization: yes Has patient had a PCN reaction occurring within the last 10 years: no If all of the above answers are "NO", then may proceed with Cephalosporin use.     Family History  Problem Relation Age of Onset  . Lung cancer Father 62  . Arthritis Father   . Breast cancer Sister 30  . Alcoholism Sister   . Dementia Mother   . Alzheimer's disease Mother   . Arthritis Mother   . Alcoholism Brother   . Epilepsy Sister   . Diabetes Paternal Grandfather   . Colon polyps Paternal Grandfather 78  . Alcoholism Sister   . Alcoholism Sister   . Alcoholism Brother     Social History Social History   Tobacco Use  . Smoking status: Former Smoker    Packs/day: 0.75    Years: 40.00    Pack years: 30.00    Types: Cigarettes    Quit date: 04/15/2017    Years since quitting: 2.5  . Smokeless tobacco: Never Used  . Tobacco comment:  1 cigarette a day  Substance Use Topics   . Alcohol use: No  . Drug use: No    Review of Systems Constitutional: Negative for fever.  Positive for generalized weakness. Cardiovascular: Negative for chest pain. Respiratory: Negative for shortness of breath. Gastrointestinal: Negative for abdominal pain, vomiting and diarrhea. Genitourinary: Negative for urinary compaints Musculoskeletal: Pain in his buttocks, right shoulder Neurological: Negative for headache All other ROS negative  ____________________________________________   PHYSICAL EXAM:  VITAL SIGNS: ED Triage Vitals  Enc Vitals Group     BP 11/04/19 1410 139/62     Pulse Rate 11/04/19 1410 98     Resp 11/04/19 1410 18     Temp 11/04/19 1410 98.4 F (36.9 C)     Temp Source 11/04/19 1410 Oral     SpO2 11/04/19 1410 95 %     Weight 11/04/19 1413 235 lb (106.6 kg)     Height 11/04/19 1413 5\' 8"  (1.727 m)     Head Circumference --      Peak Flow --      Pain Score 11/04/19 1413 10     Pain Loc --      Pain Edu? --      Excl. in [place]? --     Constitutional: Alert and oriented.  Does appear chronically ill but not acutely ill-appearing. Eyes: Mild left ptosis ENT      Head: Normocephalic and atraumatic.      Mouth/Throat: Dry mucous membranes. Cardiovascular: Normal rate, regular rhythm. No murmur Respiratory: Normal respiratory effort without tachypnea nor retractions. Breath sounds are clear  Gastrointestinal: Soft and nontender. No distention.  Obese. Musculoskeletal: Mild tenderness to the right shoulder with good range of motion.  Neurovascular intact distally.  Good range of motion all other extremities without pain elicited. Neurologic:  Normal speech and language. No gross focal neurologic deficits Skin:  Skin is warm, dry and intact.  Psychiatric: Mood and affect are normal.  ____________________________________________    EKG  EKG viewed and interpreted by myself shows a normal sinus rhythm at 102 bpm with a narrow QRS, left axis deviation,  largely normal intervals with nonspecific ST changes.  ____________________________________________    RADIOLOGY  CT head shows no acute abnormality.  CT C-spine shows no acute abnormality.  ____________________________________________   INITIAL IMPRESSION / ASSESSMENT AND PLAN / ED COURSE  Pertinent labs & imaging results that were available during my care of the patient were reviewed by me and considered in my medical decision making (see chart for details).   Patient presents to the emergency department for generalized fatigue weakness and increased falls.  Per patient daughter this appears to be more of a chronic but progressing issue.  Patient believes it is due to his neuropathy.  Patient's vitals are overall reassuring, lab work thus far overall reassuring as well including a fairly normal blood glucose.  CT images of the head and C-spine are negative.  Patient does have an elevated creatinine although per record review largely unchanged from baseline chronic kidney disease.  We will dose IV fluids as the patient does appear mildly dehydrated clinically.  We will obtain a urine sample and continue to closely monitor.  I did discuss with the daughter to follow-up with his primary care physician preferably this week or early next week to discuss further care and goals of care going forward such as PT or OT recommendations.  Daughter agreeable to plan of care.  Given the patient's complaint of pain in his  buttock/coccyx since the fall and right shoulder pain we will obtain x-ray images.  Patient does have mild tenderness of the right shoulder but good range of motion in the joint.  Patient's urinalysis is negative.  X-rays are reassuring.  Patient is feeling somewhat better after fluids.  We will discharge home with PCP follow-up.  [name] [name] was evaluated in Emergency Department on 11/04/2019 for the symptoms described in the history of present illness. He was evaluated in the context  of the global COVID-19 pandemic, which necessitated consideration that the patient might be at risk for infection with the SARS-CoV-2 virus that causes COVID-19. Institutional protocols and algorithms that pertain to the evaluation of patients at risk for COVID-19 are in a state of rapid change based on information released by regulatory bodies including the CDC and federal and state organizations. These policies and algorithms were followed during the patient's care in the ED.  ____________________________________________   FINAL CLINICAL IMPRESSION(S) / ED DIAGNOSES  Weakness Cheral Marker, MD 11/04/19 1941

## 2019-11-04 NOTE — ED Notes (Signed)
PT taken to XR

## 2019-11-04 NOTE — Discharge Instructions (Addendum)
As we discussed please follow-up with your doctor in the next several days for recheck/reevaluation and to discuss possible additional treatments such as home physical therapy or occupational therapy or a home health aide.  Return to the emergency department for any symptoms personally concerning to yourself or family member.

## 2019-11-04 NOTE — ED Triage Notes (Addendum)
Pt here for fall X 2 in last couple days.  Pt c/o right shoulder pain and "butt" pain from fall.  Has neuropathy in feet at baseline and falls a lot.  Daughter in law reports he will start talking about something and then stop talking and cannot remember what he was talking about.  Pt feels like he has been forgetful.  Family describes his as acting drunk but has not been drinking.  Pt informed RN that he did not hit his head when he fell but told family he hit his head twice when he fell. Pt reports legs have felt weak.

## 2019-11-04 NOTE — ED Triage Notes (Signed)
First Nurse Note:  ARrives with daughter who states patient fell last night and is c/o back pain.  Also states that patient has been more confused today, since waking.   Patient is AAOx3.  Skin warm and dry. NAD.

## 2019-11-06 ENCOUNTER — Telehealth: Payer: Self-pay

## 2019-11-06 DIAGNOSIS — E1151 Type 2 diabetes mellitus with diabetic peripheral angiopathy without gangrene: Secondary | ICD-10-CM

## 2019-11-06 DIAGNOSIS — I10 Essential (primary) hypertension: Secondary | ICD-10-CM

## 2019-11-06 NOTE — Telephone Encounter (Signed)
Per written referral from PCP, requesting referral in Epic for [name] to chronic care management pharmacy services for the following conditions:   Essential hypertension, benign  [I10]  Type 2 diabetes mellitus with diabetic peripheral angiopathy without gangrene [E11.51]  Debbora Dus, PharmD Clinical Pharmacist [facility] [phone]

## 2019-11-06 NOTE — Telephone Encounter (Signed)
Called pt to see how he is doing after recent ER visit for a fall. He was unable to talk but said he would call the office later to schedule an ER follow-up next week.

## 2019-11-07 NOTE — Telephone Encounter (Signed)
Referral created.

## 2019-11-10 DIAGNOSIS — M545 Low back pain: Secondary | ICD-10-CM | POA: Diagnosis not present

## 2019-11-10 DIAGNOSIS — R2689 Other abnormalities of gait and mobility: Secondary | ICD-10-CM | POA: Diagnosis not present

## 2019-11-10 DIAGNOSIS — M5489 Other dorsalgia: Secondary | ICD-10-CM | POA: Diagnosis not present

## 2019-11-10 DIAGNOSIS — R200 Anesthesia of skin: Secondary | ICD-10-CM | POA: Diagnosis not present

## 2019-11-11 ENCOUNTER — Ambulatory Visit: Payer: Medicare Other

## 2019-11-11 ENCOUNTER — Other Ambulatory Visit: Payer: Self-pay | Admitting: Student

## 2019-11-11 ENCOUNTER — Other Ambulatory Visit: Payer: Self-pay

## 2019-11-11 DIAGNOSIS — R2689 Other abnormalities of gait and mobility: Secondary | ICD-10-CM

## 2019-11-11 DIAGNOSIS — J440 Chronic obstructive pulmonary disease with acute lower respiratory infection: Secondary | ICD-10-CM

## 2019-11-11 DIAGNOSIS — E782 Mixed hyperlipidemia: Secondary | ICD-10-CM

## 2019-11-11 DIAGNOSIS — Z794 Long term (current) use of insulin: Secondary | ICD-10-CM

## 2019-11-11 DIAGNOSIS — J209 Acute bronchitis, unspecified: Secondary | ICD-10-CM

## 2019-11-11 DIAGNOSIS — R200 Anesthesia of skin: Secondary | ICD-10-CM

## 2019-11-11 DIAGNOSIS — I10 Essential (primary) hypertension: Secondary | ICD-10-CM

## 2019-11-11 DIAGNOSIS — M5489 Other dorsalgia: Secondary | ICD-10-CM

## 2019-11-11 DIAGNOSIS — E1151 Type 2 diabetes mellitus with diabetic peripheral angiopathy without gangrene: Secondary | ICD-10-CM

## 2019-11-11 NOTE — Chronic Care Management (AMB) (Signed)
Chronic Care Management Pharmacy  Name: [name] [name]  MRN: [id number] DOB: 1955/08/02  Chief Complaint/ HPI  [name] [name],  64 y.o., male presents for their Initial CCM visit with the clinical pharmacist via telephone.  PCP : [name] Carbon, MD  Their chronic conditions include: hypertension, hyperlipidemia, COPD, diabetes  Patient concerns: reports multiple falls in the past couples of months, contributes to imbalance and numbness/pain in feet; reports legs are heavy, easily fatigued, increasing swelling and SOB, fears leaving his bedroom at times, using both wheelchair (outside) and cane --> refer to PCP for further eval/review med list for potential de-prescribing   Recent hospitalizations:   11/04/19 - ED, fall   Office Visits:  10/05/19: [name] [name] - DM controlled, cont current meds  Consult Visit:  11/10/19: Neurology - back pain, neuropathy, multiple falls, numbness in lower extremities, uses donut and heating pad, MRI ordered  11/03/19: Cardiology - long-term DAPT, cont current meds   09/02/19: Nephrology - cont current meds   Allergies  Allergen Reactions  . Contrast Media [Iodinated Diagnostic Agents] Rash and Other (See Comments)    Got very hot and red   . Glipizide Other (See Comments)    ANTIDIABETICS. Burning  . Metrizamide Other (See Comments)    Got very hot and red  . Penicillins Hives and Swelling    Has patient had a PCN reaction causing immediate rash, facial/tongue/throat swelling, SOB or lightheadedness with hypotension: yes Has patient had a PCN reaction causing severe rash involving mucus membranes or skin necrosis: no  Has patient had a PCN reaction that required hospitalization: yes Has patient had a PCN reaction occurring within the last 10 years: no If all of the above answers are "NO", then may proceed with Cephalosporin use.    Medications: Outpatient Encounter Medications as of 11/11/2019  Medication Sig  . furosemide (LASIX) 20 MG  tablet Take 20 mg by mouth daily.  [name] ACCU-CHEK FASTCLIX LANCETS MISC Use to check sugar 1 time daily.  [name] albuterol (PROAIR HFA) 108 (90 Base) MCG/ACT inhaler Inhale 2 puffs into the lungs every 6 (six) hours as needed for shortness of breath.  [name] aspirin 81 MG tablet Take 81 mg by mouth daily.  [name] buPROPion (WELLBUTRIN XL) 300 MG 24 hr tablet Take 1 tablet (300 mg total) by mouth daily.  . busPIRone (BUSPAR) 10 MG tablet TAKE 2 TABLETS(20 MG) BY MOUTH TWICE DAILY  . calcitRIOL (ROCALTROL) 0.25 MCG capsule Take 0.25 mcg by mouth 3 (three) times a week.  . carvedilol (COREG) 12.5 MG tablet Take 1 tablet (12.5 mg total) by mouth 2 (two) times daily. PLEASE SCHEDULE APPOINTMENT FOR FUTURE REFILLS.  [name] clopidogrel (PLAVIX) 75 MG tablet TAKE 1 TABLET(75 MG) BY MOUTH DAILY  . CONTOUR TEST test strip TEST EVERY DAY  . Dextran 70-Hypromellose (ARTIFICIAL TEARS) 0.1-0.3 % SOLN Place 1 drop into both eyes 4 (four) times daily as needed (dry eyes).  . finasteride (PROSCAR) 5 MG tablet Take 5 mg by mouth daily.  . hydroxychloroquine (PLAQUENIL) 200 MG tablet Comments:   Filled Date: Feb 19 2019 12:00AM  Patient Notes: TK 1 T PO BID  Duration: 90  . losartan (COZAAR) 100 MG tablet TAKE 1 TABLET(100 MG) BY MOUTH DAILY (Patient taking differently: Take 100 mg by mouth daily. )  . Multiple Vitamin (MULTIVITAMIN) capsule Take 1 capsule by mouth daily.   . nitroGLYCERIN (NITROSTAT) 0.4 MG SL tablet Place 1 tablet (0.4 mg total) under the tongue every 5 (five)  minutes as needed.  [name] RELION INSULIN SYRINGE 31G X 15/64" 1 ML MISC ONE INJECTION THREE TIMES DAILY  . sertraline (ZOLOFT) 100 MG tablet Take 2 tablets (200 mg total) by mouth daily.  . tamsulosin (FLOMAX) 0.4 MG CAPS capsule take 2 capsule by mouth daily  . [DISCONTINUED] cyclobenzaprine (FLEXERIL) 10 MG tablet Take by mouth.  . [DISCONTINUED] insulin NPH Human (NOVOLIN N RELION) 100 UNIT/ML injection Inject 0.2 mLs (20 Units total) into the skin at bedtime.  (Patient taking differently: Inject 20 Units into the skin at bedtime as needed (high blood sugar). )  . [DISCONTINUED] insulin regular (NOVOLIN R) 100 units/mL injection Inject 20 Units into the skin See admin instructions. Per sliding scale up to 3 times daily if needed  . [DISCONTINUED] simvastatin (ZOCOR) 20 MG tablet Take 1 tablet (20 mg total) by mouth daily at 6 PM.   No facility-administered encounter medications on file as of 11/11/2019.   Current Diagnosis/Assessment:  Goals Addressed            This Visit's Progress   . Pharmacy Care Plan       CARE PLAN ENTRY  Current Barriers:  . Chronic Disease Management support, education, and care coordination needs related to diabetes, hypertension, COPD, allergies, neuropathy  Pharmacist Clinical Goal(s):  [name] Over the next 2 weeks, patient will work with PharmD and primary care provider to address the following goals: o Diabetes: Maintain A1c goal of less than 7% with fasting blood glucose within 80-130 mg/dL and blood glucose 1-2 hours after meals less than 180 mg/dL; prevent hypoglycemia (blood glucose less than 70 mg/dL) o Hypertension: Maintain blood pressure goal of less than 140/90 mmHg o Neuropathy/Low Back Pain: Improve pain and discontinue inappropriate medications; Increase Tylenol and stop ibuprofen. o COPD/Allergies: Improve shortness of breath o Vaccinations: Remain up to date on vaccinations. Recommend 2-dose series of shingles vaccine (Shingrix)  Interventions: . Comprehensive medication review performed . Review cost comparison for pharmacy coordination, adherence packaging, and delivery services  Patient Self Care Activities:  For the next 2 weeks until follow up visit:  . Monitor fasting blood glucose daily . Monitor blood pressure three days per week in the morning prior to breakfast or medications; Keep a log of all readings . Take Tylenol 500 mg on scheduled basis, every 4-6 hours and avoid all NSAIDs  including ibuprofen, naproxen, etc. . Try over the counter Flonase nasal spray - 2 sprays in each nostril daily to improve allergies/reduce shortness of breath  Initial goal documentation        Financial Resource Strain: Low Risk   . Difficulty of Paying Living Expenses: Not very hard    Hyperlipidemia/CAD   Lipid Panel     Component Value Date/Time   CHOL 81 04/07/2019 1201   CHOL 116 11/18/2017 1002   TRIG 103.0 04/07/2019 1201   HDL 26.80 (L) 04/07/2019 1201   HDL 30 (L) 11/18/2017 1002   CHOLHDL 3 04/07/2019 1201   VLDL 20.6 04/07/2019 1201   LDLCALC 34 04/07/2019 1201   LDLCALC 51 11/18/2017 1002   LDLDIRECT 61.0 03/15/2017 1021   LABVLDL 35 11/18/2017 1002    CBC Latest Ref Rng & Units 11/04/2019 04/07/2019 10/21/2018  WBC 4.0 - 10.5 K/uL 8.7 7.3 -  Hemoglobin 13.0 - 17.0 g/dL 11.9(L) 13.1 11.2(A)  Hematocrit 39.0 - 52.0 % 36.7(L) 39.5 -  Platelets 150 - 400 K/uL 143(L) 167.0 145(A)   LDL goal < 70 Patient has failed these meds in past:  Vascepa (cost) Patient is currently controlled on the following medications:   Simvastatin 20 mg - 1 tablet daily at 6 PM (confirmed)  Aspirin 81 mg - 1 tablet daily (comfired)  Plavix 75 mg - 1 tablet daily (confirmed)  We discussed: confirmed adherence; kong-term DAPT per cardio  Plan: Continue current medications  Hypertension   CMP Latest Ref Rng & Units 11/04/2019 06/09/2019 04/07/2019  Glucose 70 - 99 mg/dL 110(H) - 85  BUN 8 - 23 mg/dL 44(H) 45(H) 50(H)  Creatinine 0.61 - 1.24 mg/dL 3.62(H) 2.76(H) 3.30(H)  Sodium 135 - 145 mmol/L 137 - 137  Potassium 3.5 - 5.1 mmol/L 4.7 - 5.3(H)  Chloride 98 - 111 mmol/L 103 - 101  CO2 22 - 32 mmol/L 22 - 29  Calcium 8.9 - 10.3 mg/dL 8.9 - 8.9  Total Protein 6.5 - 8.1 g/dL 8.4(H) - 7.2  Total Bilirubin 0.3 - 1.2 mg/dL 1.0 - 0.6  Alkaline Phos 38 - 126 U/L 94 - 80  AST 15 - 41 U/L 16 - 12  ALT 0 - 44 U/L 15 - 12   Office blood pressures are: BP Readings from Last 3  Encounters:  11/16/19 114/70  11/04/19 (!) 170/73  11/03/19 140/84   BP goal < 140/90 mmHg Patient has failed these meds in the past: none reported Patient checks BP at home infrequently, has home monitor, wrist cuff  Patient home BP readings are ranging: none reported  Patient is currently uncontrolled on the following medications:   Carvedilol 12.5 mg - 1 tablet BID (confirmed)  Furosemide 20 mg - 1 tablet daily at night (confirmed)  Losartan 100 mg - 1 tablet daily (confirmed)  We discussed: prefers to take lasix at night to avoid frequent urination during the day/appts; Wears compression stockings daily; decline in renal function  Plan: Continue current medications; Check BP 3 days per week for the next 2 weeks. Avoid NSAIDs and limit sodium.  COPD / Tobacco   Last spirometry score: none per chart  Eosinophil count:   Lab Results  Component Value Date/Time   EOSPCT 3.7 03/15/2017 10:21 AM  %                               Eos (Absolute):  Lab Results  Component Value Date/Time   EOSABS 0.2 03/15/2017 10:21 AM   Tobacco Status:  Social History   Tobacco Use  Smoking Status Former Smoker  . Packs/day: 0.75  . Years: 40.00  . Pack years: 30.00  . Types: Cigarettes  . Quit date: 04/15/2017  . Years since quitting: 2.6  Smokeless Tobacco Never Used  Tobacco Comment   1 cigarette a day   Patient has failed these meds in past: none  Patient is currently controlled on the following medications:   Albuterol 90 mcg - Inhale 2 puffs q6h PRN   Zyrtec 10 mg - 1 tablet as needed  Using maintenance inhaler regularly? None prescribed Frequency of rescue inhaler use:  3-5x times per week (recent increase, usually increases around this time of year)  We discussed: primarily allergy related; may benefit from Flonase nasal spray   Plan: Continue current medications; Recommend OTC Flonase nasal spray 2 sprays in each nostril during allergy season.  Diabetes   Recent  Relevant Labs: Lab Results  Component Value Date/Time   HGBA1C 5.7 (A) 10/05/2019 11:26 AM   HGBA1C 6.1 (A) 02/11/2019 08:07 AM   HGBA1C 5.9 06/11/2016  09:56 AM   HGBA1C 6.2 03/06/2016 11:11 AM   MICROALBUR <0.7 12/13/2015 08:46 AM   MICROALBUR 38.0 (H) 06/14/2015 08:01 AM    A1c < 7% Checking BG: Daily (time of day varies) Hypoglycemia: has symptoms with BG < 100 (feels off), denies any readings less than 70 mg/dL Recent BG readings: 100-168, occasionally low 200s (self-reported - did not have meter nearby)  Patient has failed these meds in past: none Patient is currently controlled on the following medications:   Insulin NPH (Novolin N) - 15 units once daily  Insulin regular (Novolin R) - 15 units once daily  **Denies taking insulin in 1-2 months; only takes if BG is > 230  Last diabetic eye exam:  Lab Results  Component Value Date/Time   HMDIABEYEEXA Retinopathy (A) 04/13/2019 12:00 AM    Last diabetic foot exam:  Lab Results  Component Value Date/Time   HMDIABFOOTEX done 07/02/2019 12:00 AM    We discussed: diet - daughter in law cooks healthy meals; reports very aware of when he has highs or lows  Plan: Continue current medications; Follow up telephone call in 2 weeks to review glucometer readings.   Depression   Patient has tried these meds in past: none reported Patient is currently controlled on the following medications:   Duloxetine 60 mg - 1 tablet BID   Bupropion 300 mg - 1 tablet daily   Buspirone 10 mg - 2 tablets BID  Sertraline 100 mg - 2 tablets daily (takes 1 tab BID)  We discussed: wife passed this year and recently gave up driving; situational stressors, doing okay  Renal dose recommendations:  - Caution with duloxetine in CrCl < 30; avoid use or limit to 60 mg/day  - Caution with bupropion in CrCl 15-50; limit dose to 150 mg/day  - Caution with buspirone, avoid use in severe renal impairment  Plan: Continue current medications; Consider  renal dose reductions.   CKD   Scr: 3.62, GFR 16 ml/min (11/04/19) Adjusted body weight: 82 kg CrCl: 23 ml/min (adj. bw)  30 ml/min (actual bw) He is followed by Dr. [name] Patient has failed these meds in past: none reported Patient is currently on the following medications:   Calcitriol 0.25 mcg - 1 tablet three times a week (M,W,F - confirmed)  Plan: Continue current medications  BPH   Patient has failed these meds in past: none reported Patient is currently controlled on the following medications:   Tamsulosin 0.4 mg - 2 caps daily (comfirmed) - 1 mid morning/1 at night  Finasteride 5 mg - 1 tab daily (confirmed)  We discussed: denies current problems with urinary frequency; no renal dose adjustments recommended; may consider decreasing tamsulosin due to falls   Plan: Continue current medications  Neuropathy/Low back pain    Patient has failed these meds in past:  Patient is currently uncontrolled on the following medications:   Duloxetine 60 mg - 1 tablet BID   Cyclobenzaprine 10 mg - 1 tablet PRN   Ibuprofen 200 mg - takes up to 4 tablets a day, 1 every 6 hours  Tylenol 500 mg PRN   We discussed: discussed avoiding NSAIDs but pt reports pain is intolerable; impacting sleep, may go 24 hours without sleeping; expressed concerns about NSAIDs with renal impairment  Plan: Continue current medications; Schedule Tylenol 500 mg every 4-6 hours and strongly recommend avoiding NSAIDs due to renal function.    Vaccines   Reviewed and discussed patient's vaccination history.    Immunization History  Administered Date(s) Administered  . Fluad Quad(high Dose 65+) 04/07/2019  . Influenza Split 04/12/2011, 04/29/2012  . Influenza,inj,Quad PF,6+ Mos 05/18/2013, 03/19/2014, 03/14/2015, 05/01/2017, 03/25/2018  . Influenza-Unspecified 03/13/2016  . PFIZER SARS-COV-2 Vaccination 09/17/2019, 10/08/2019  . Pneumococcal Conjugate-13 03/15/2017  . Pneumococcal Polysaccharide-23  07/03/2012, 10/09/2013  . Tdap 08/06/2009, 04/24/2018  . Zoster 11/24/2010   Plan: Recommended patient receive Shingrix  Medication Management  Misc: nitroglycerin 0.4 mg - 1 tablet PRN, hydroxychloroquine - not taking   OTCs: multivitamin  Pharmacy/Benefits: Medicare ([employer], [place] (daughter in law picks up), working well; would like to check cost with [employer]   Social support: daughter-in-law provides transportation   Affordability: denies concerns  CCM Follow Up: Nov 23, 2019 at 9:15 AM (telephone)  Debbora Dus, PharmD Clinical Pharmacist [facility] [phone]

## 2019-11-12 NOTE — Telephone Encounter (Signed)
Pt called and scheduled for 11/16/19 @ 10:30am.

## 2019-11-16 ENCOUNTER — Other Ambulatory Visit: Payer: Self-pay

## 2019-11-16 ENCOUNTER — Encounter: Payer: Self-pay | Admitting: Internal Medicine

## 2019-11-16 ENCOUNTER — Ambulatory Visit (INDEPENDENT_AMBULATORY_CARE_PROVIDER_SITE_OTHER): Payer: Medicare Other | Admitting: Internal Medicine

## 2019-11-16 VITALS — BP 114/70 | HR 84 | Temp 97.8°F | Ht 68.0 in | Wt 233.0 lb

## 2019-11-16 DIAGNOSIS — G629 Polyneuropathy, unspecified: Secondary | ICD-10-CM

## 2019-11-16 DIAGNOSIS — E1122 Type 2 diabetes mellitus with diabetic chronic kidney disease: Secondary | ICD-10-CM

## 2019-11-16 DIAGNOSIS — G4733 Obstructive sleep apnea (adult) (pediatric): Secondary | ICD-10-CM | POA: Diagnosis not present

## 2019-11-16 DIAGNOSIS — E1151 Type 2 diabetes mellitus with diabetic peripheral angiopathy without gangrene: Secondary | ICD-10-CM

## 2019-11-16 DIAGNOSIS — W19XXXA Unspecified fall, initial encounter: Secondary | ICD-10-CM

## 2019-11-16 DIAGNOSIS — R29898 Other symptoms and signs involving the musculoskeletal system: Secondary | ICD-10-CM | POA: Diagnosis not present

## 2019-11-16 DIAGNOSIS — N184 Chronic kidney disease, stage 4 (severe): Secondary | ICD-10-CM | POA: Diagnosis not present

## 2019-11-16 DIAGNOSIS — I70222 Atherosclerosis of native arteries of extremities with rest pain, left leg: Secondary | ICD-10-CM | POA: Diagnosis not present

## 2019-11-16 NOTE — Assessment & Plan Note (Signed)
Mild elevation of creatinine at ER visit--not significantly different though

## 2019-11-16 NOTE — Assessment & Plan Note (Signed)
Lab Results  Component Value Date   HGBA1C 5.7 (A) 10/05/2019   Good control Doesn't need the regular insulin Hasn't used the NPH either--will take off list

## 2019-11-16 NOTE — Assessment & Plan Note (Signed)
Discussed that the cane is not adequate due to his weakness Needs walker and PT/OT

## 2019-11-16 NOTE — Assessment & Plan Note (Signed)
Seems to be multifactorial Not clearly related to medication--despite his polypharmacy Is related to neuropathy and weakness---will set up therapy Needs to use rolling walker

## 2019-11-16 NOTE — Progress Notes (Addendum)
Subjective:    Patient ID: [name] [name], male    DOB: Nov 24, 1955, 64 y.o.   MRN: [id number]  HPI Here for ER follow up This visit occurred during the SARS-CoV-2 public health emergency.  Safety protocols were in place, including screening questions prior to the visit, additional usage of staff PPE, and extensive cleaning of exam room while observing appropriate contact time as indicated for disinfecting solutions.   He was getting ready to go somewhere-in bedroom Walked out of bedroom into dining room "and all of a sudden I just went down" Children insisted on ER visit Reviewed the note, imaging, etc No LOC----hit edge of dining room table with right shoulder (soreness now almost gone) Did hit back of head---he didn't note much pain (and head/cervical spine CTs were unremarkable)  Children are living with him --son and daughter in law (and grandson) Uses cane-but didn't prevent fall Now getting walker---2 wheeled  Ongoing neuropathy--he attributes this for the fall Now has ramp to get out of house in the back--this is how he gets in/out of house He doesn't drive  Sugars in low 100's No hypoglycemic reactions Hasn't needed regular insulin of late  Still uses the CPAP every night Is helps him sleep well  Current Outpatient Medications on File Prior to Visit  Medication Sig Dispense Refill  . ACCU-CHEK FASTCLIX LANCETS MISC Use to check sugar 1 time daily. 100 each 5  . albuterol (PROAIR HFA) 108 (90 Base) MCG/ACT inhaler Inhale 2 puffs into the lungs every 6 (six) hours as needed for shortness of breath. 18 g 3  . aspirin 81 MG tablet Take 81 mg by mouth daily.    [name] buPROPion (WELLBUTRIN XL) 300 MG 24 hr tablet Take 1 tablet (300 mg total) by mouth daily. 90 tablet 3  . busPIRone (BUSPAR) 10 MG tablet TAKE 2 TABLETS(20 MG) BY MOUTH TWICE DAILY 360 tablet 3  . calcitRIOL (ROCALTROL) 0.25 MCG capsule Take 0.25 mcg by mouth 3 (three) times a week.    . carvedilol (COREG) 12.5 MG  tablet Take 1 tablet (12.5 mg total) by mouth 2 (two) times daily. PLEASE SCHEDULE APPOINTMENT FOR FUTURE REFILLS. 30 tablet 0  . clopidogrel (PLAVIX) 75 MG tablet TAKE 1 TABLET(75 MG) BY MOUTH DAILY 90 tablet 0  . CONTOUR TEST test strip TEST EVERY DAY 100 strip 11  . cyclobenzaprine (FLEXERIL) 10 MG tablet Take by mouth.    . Dextran 70-Hypromellose (ARTIFICIAL TEARS) 0.1-0.3 % SOLN Place 1 drop into both eyes 4 (four) times daily as needed (dry eyes).    . finasteride (PROSCAR) 5 MG tablet Take 5 mg by mouth daily.    . furosemide (LASIX) 20 MG tablet Take 20 mg by mouth daily.    . hydroxychloroquine (PLAQUENIL) 200 MG tablet Comments:   Filled Date: Feb 19 2019 12:00AM  Patient Notes: TK 1 T PO BID  Duration: 90    . insulin NPH Human (NOVOLIN N RELION) 100 UNIT/ML injection Inject 0.2 mLs (20 Units total) into the skin at bedtime. (Patient taking differently: Inject 20 Units into the skin at bedtime as needed (high blood sugar). ) 10 mL 11  . insulin regular (NOVOLIN R) 100 units/mL injection Inject 20 Units into the skin See admin instructions. Per sliding scale up to 3 times daily if needed    . losartan (COZAAR) 100 MG tablet TAKE 1 TABLET(100 MG) BY MOUTH DAILY (Patient taking differently: Take 100 mg by mouth daily. ) 30 tablet 2  .  nitroGLYCERIN (NITROSTAT) 0.4 MG SL tablet Place 1 tablet (0.4 mg total) under the tongue every 5 (five) minutes as needed. 25 tablet 1  . RELION INSULIN SYRINGE 31G X 15/64" 1 ML MISC ONE INJECTION THREE TIMES DAILY 200 each 2  . sertraline (ZOLOFT) 100 MG tablet Take 2 tablets (200 mg total) by mouth daily. 180 tablet 3  . simvastatin (ZOCOR) 20 MG tablet Take 1 tablet (20 mg total) by mouth daily at 6 PM. 90 tablet 0  . tamsulosin (FLOMAX) 0.4 MG CAPS capsule take 2 capsule by mouth daily 180 capsule 3  . Multiple Vitamin (MULTIVITAMIN) capsule Take 1 capsule by mouth daily.      No current facility-administered medications on file prior to visit.     Allergies  Allergen Reactions  . Contrast Media [Iodinated Diagnostic Agents] Rash and Other (See Comments)    Got very hot and red   . Glipizide Other (See Comments)    ANTIDIABETICS. Burning  . Metrizamide Other (See Comments)    Got very hot and red  . Penicillins Hives and Swelling    Has patient had a PCN reaction causing immediate rash, facial/tongue/throat swelling, SOB or lightheadedness with hypotension: yes Has patient had a PCN reaction causing severe rash involving mucus membranes or skin necrosis: no  Has patient had a PCN reaction that required hospitalization: yes Has patient had a PCN reaction occurring within the last 10 years: no If all of the above answers are "NO", then may proceed with Cephalosporin use.     Past Medical History:  Diagnosis Date  . Adenomatous colon polyp   . Allergy   . Angina at rest [facility])    chronic  . Anxiety   . Arthritis    RA  . Bell's palsy 2007  . Carotid artery occlusion   . Cataract    Dr. Dawna Part  . Coronary artery disease   . Depression   . Diabetes mellitus   . Diverticulosis   . Duodenitis   . DVT (deep venous thrombosis) (H[facility])    in leg  . Dyspnea    DOE  . Gastropathy   . GERD (gastroesophageal reflux disease)   . Heart murmur   . Helicobacter pylori gastritis   . Hyperlipidemia   . Hypertension   . Kidney failure   . Mild aortic stenosis   . Neuropathy   . Obesity   . Peripheral vascular disease ([place])   . Post splenectomy syndrome   . Psoriasis   . Psoriatic arthritis ([place])   . Renal disorder    stage 4 kidney failure  . Sleep apnea    uses cpap  . SOB (shortness of breath)   . Stroke ([place])   . Tobacco use disorder    recently quit  . Tubular adenoma 08/2013   Dr. Hilarie Fredrickson  . Weak urinary stream     Past Surgical History:  Procedure Laterality Date  . CARDIAC CATHETERIZATION  08/2010   LAD: 80% ISR, RCA: 80% ostial, OM 80-90%  . CAROTID ENDARTERECTOMY  08/2010   left/ Dr. Kellie Simmering  .  CATARACT EXTRACTION W/PHACO Left 07/09/2017   Procedure: CATARACT EXTRACTION PHACO AND INTRAOCULAR LENS PLACEMENT (IOC);  Surgeon: Birder Robson, MD;  Location: [facility] ORS;  Service: Ophthalmology;  Laterality: Left;  [place] 00:23 AP% 14.2 CDE 3.26 Fluid pack lot # [id number]  . CATARACT EXTRACTION W/PHACO Right 09/10/2017   Procedure: CATARACT EXTRACTION PHACO AND INTRAOCULAR LENS PLACEMENT (IOC);  Surgeon: Birder Robson, MD;  Location:  [facility] ORS;  Service: Ophthalmology;  Laterality: Right;  [place] 00:26.4 AP% 17.3 CDE 4.59 Fluid Pack Lot # [id number]  . COLONOSCOPY    . CORONARY ANGIOPLASTY     LAD: before CABG  . CORONARY ARTERY BYPASS GRAFT  09/06/2010   At [facility]: LIMA to LAD, left radial to RCA, sequential SVG to OM3 and 4  . heart stents  Jan 2011   leg stents 06/2009 and 03/2010  . LOWER EXTREMITY ANGIOGRAPHY Left 06/09/2019   Procedure: LOWER EXTREMITY ANGIOGRAPHY;  Surgeon: Katha Cabal, MD;  Location: [place] [facility];  Service: Cardiovascular;  Laterality: Left;  . PTA of illiac and SFA  multiple   Dr. Ronalee Belts, s/p revision 11/2012  . SPLENECTOMY    . VASCULAR SURGERY     LEG STENTS    Family History  Problem Relation Age of Onset  . Lung cancer Father 33  . Arthritis Father   . Breast cancer Sister 33  . Alcoholism Sister   . Dementia Mother   . Alzheimer's disease Mother   . Arthritis Mother   . Alcoholism Brother   . Epilepsy Sister   . Diabetes Paternal Grandfather   . Colon polyps Paternal Grandfather 5  . Alcoholism Sister   . Alcoholism Sister   . Alcoholism Brother     Social History   Socioeconomic History  . Marital status: Widowed    Spouse name: Not on file  . Number of children: 2  . Years of education: Not on file  . Highest education level: Not on file  Occupational History  . Occupation: Chief Operating Officer at [employer]: Disabled mostly due to neuropathy  Tobacco Use  . Smoking status: Former Smoker    Packs/day: 0.75    Years:  40.00    Pack years: 30.00    Types: Cigarettes    Quit date: 04/15/2017    Years since quitting: 2.5  . Smokeless tobacco: Never Used  . Tobacco comment: 1 cigarette a day  Substance and Sexual Activity  . Alcohol use: No  . Drug use: No  . Sexual activity: Never  Other Topics Concern  . Not on file  Social History Narrative   Wife died 2019/01/26      Has living will   Gearldine Shown and his wife Levada Dy should be his health care POA   Would accept resuscitation --but no prolonged ventilation   No tube feeds if cognitively unaware   Social Determinants of Health   Financial Resource Strain: Low Risk   . Difficulty of Paying Living Expenses: Not very hard  Food Insecurity: No Food Insecurity  . Worried About Charity fundraiser in the Last Year: Never true  . Ran Out of Food in the Last Year: Never true  Transportation Needs: No Transportation Needs  . Lack of Transportation (Medical): No  . Lack of Transportation (Non-Medical): No  Physical Activity:   . Days of Exercise per Week:   . Minutes of Exercise per Session:   Stress: No Stress Concern Present  . Feeling of Stress : Only a little  Social Connections: Unknown  . Frequency of Communication with Friends and Family: More than three times a week  . Frequency of Social Gatherings with Friends and Family: Not on file  . Attends Religious Services: Not on file  . Active Member of Clubs or Organizations: Not on file  . Attends Archivist Meetings: Not on file  . Marital Status: Not  on file  Intimate Partner Violence: Unknown  . Fear of Current or Ex-Partner: No  . Emotionally Abused: No  . Physically Abused: No  . Sexually Abused: Not on file   Review of Systems  No diplopia  No dizziness Eating okay    Objective:   Physical Exam  Constitutional: No distress.  Neck: No thyromegaly present.  Cardiovascular: Normal rate and regular rhythm. Exam reveals no gallop.  Same systolic murmur  Respiratory:  Effort normal and breath sounds normal. No respiratory distress. He has no wheezes. He has no rales.  Musculoskeletal:        General: No edema.  Lymphadenopathy:    He has no cervical adenopathy.  Neurological:  Gait is slow--some left leg weakness compared to right. Not ataxic Gr 3+/6 strength in legs (better at hips than knees)  Psychiatric: He has a normal mood and affect. His behavior is normal.           Assessment & Plan:

## 2019-11-16 NOTE — Assessment & Plan Note (Signed)
Related to his diabetes Seems to be a big part of his instability Diabetes control is good though

## 2019-11-20 ENCOUNTER — Other Ambulatory Visit: Payer: Self-pay | Admitting: Cardiovascular Disease

## 2019-11-20 DIAGNOSIS — E785 Hyperlipidemia, unspecified: Secondary | ICD-10-CM | POA: Diagnosis not present

## 2019-11-20 DIAGNOSIS — W19XXXD Unspecified fall, subsequent encounter: Secondary | ICD-10-CM | POA: Diagnosis not present

## 2019-11-20 DIAGNOSIS — K5790 Diverticulosis of intestine, part unspecified, without perforation or abscess without bleeding: Secondary | ICD-10-CM | POA: Diagnosis not present

## 2019-11-20 DIAGNOSIS — I10 Essential (primary) hypertension: Secondary | ICD-10-CM | POA: Diagnosis not present

## 2019-11-20 DIAGNOSIS — E1140 Type 2 diabetes mellitus with diabetic neuropathy, unspecified: Secondary | ICD-10-CM | POA: Diagnosis not present

## 2019-11-20 DIAGNOSIS — Z6837 Body mass index (BMI) 37.0-37.9, adult: Secondary | ICD-10-CM | POA: Diagnosis not present

## 2019-11-20 DIAGNOSIS — E1122 Type 2 diabetes mellitus with diabetic chronic kidney disease: Secondary | ICD-10-CM | POA: Diagnosis not present

## 2019-11-20 DIAGNOSIS — D369 Benign neoplasm, unspecified site: Secondary | ICD-10-CM | POA: Diagnosis not present

## 2019-11-20 DIAGNOSIS — J449 Chronic obstructive pulmonary disease, unspecified: Secondary | ICD-10-CM | POA: Diagnosis not present

## 2019-11-20 DIAGNOSIS — E1151 Type 2 diabetes mellitus with diabetic peripheral angiopathy without gangrene: Secondary | ICD-10-CM | POA: Diagnosis not present

## 2019-11-20 DIAGNOSIS — R011 Cardiac murmur, unspecified: Secondary | ICD-10-CM | POA: Diagnosis not present

## 2019-11-20 DIAGNOSIS — I350 Nonrheumatic aortic (valve) stenosis: Secondary | ICD-10-CM | POA: Diagnosis not present

## 2019-11-20 DIAGNOSIS — E669 Obesity, unspecified: Secondary | ICD-10-CM | POA: Diagnosis not present

## 2019-11-20 DIAGNOSIS — R296 Repeated falls: Secondary | ICD-10-CM | POA: Diagnosis not present

## 2019-11-20 DIAGNOSIS — I6529 Occlusion and stenosis of unspecified carotid artery: Secondary | ICD-10-CM | POA: Diagnosis not present

## 2019-11-20 DIAGNOSIS — F419 Anxiety disorder, unspecified: Secondary | ICD-10-CM | POA: Diagnosis not present

## 2019-11-20 DIAGNOSIS — G4730 Sleep apnea, unspecified: Secondary | ICD-10-CM | POA: Diagnosis not present

## 2019-11-20 DIAGNOSIS — L4050 Arthropathic psoriasis, unspecified: Secondary | ICD-10-CM | POA: Diagnosis not present

## 2019-11-20 DIAGNOSIS — F329 Major depressive disorder, single episode, unspecified: Secondary | ICD-10-CM | POA: Diagnosis not present

## 2019-11-20 DIAGNOSIS — N184 Chronic kidney disease, stage 4 (severe): Secondary | ICD-10-CM | POA: Diagnosis not present

## 2019-11-20 DIAGNOSIS — I25110 Atherosclerotic heart disease of native coronary artery with unstable angina pectoris: Secondary | ICD-10-CM | POA: Diagnosis not present

## 2019-11-20 DIAGNOSIS — Z9181 History of falling: Secondary | ICD-10-CM | POA: Diagnosis not present

## 2019-11-20 DIAGNOSIS — D126 Benign neoplasm of colon, unspecified: Secondary | ICD-10-CM | POA: Diagnosis not present

## 2019-11-20 DIAGNOSIS — M069 Rheumatoid arthritis, unspecified: Secondary | ICD-10-CM | POA: Diagnosis not present

## 2019-11-20 DIAGNOSIS — Z87891 Personal history of nicotine dependence: Secondary | ICD-10-CM | POA: Diagnosis not present

## 2019-11-20 NOTE — Patient Instructions (Signed)
Dear [name] [name],  It was a pleasure meeting you during our initial appointment on November 11, 2019. Below is a summary of the goals we discussed and components of chronic care management. Please contact me anytime with questions or concerns.   Visit Information  Goals Addressed            This Visit's Progress   . Pharmacy Care Plan       CARE PLAN ENTRY  Current Barriers:  . Chronic Disease Management support, education, and care coordination needs related to diabetes, hypertension, COPD, allergies, neuropathy  Pharmacist Clinical Goal(s):  [name] Over the next 2 weeks, patient will work with PharmD and primary care provider to address the following goals: o Diabetes: Maintain A1c goal of less than 7% with fasting blood glucose within 80-130 mg/dL and blood glucose 1-2 hours after meals less than 180 mg/dL; prevent hypoglycemia (blood glucose less than 70 mg/dL) o Hypertension: Maintain blood pressure goal of less than 140/90 mmHg o Neuropathy/Low Back Pain: Improve pain and discontinue inappropriate medications; Increase Tylenol and stop ibuprofen. o COPD/Allergies: Improve shortness of breath o Vaccinations: Remain up to date on vaccinations. Recommend 2-dose series of shingles vaccine (Shingrix)  Interventions: . Comprehensive medication review performed . Review cost comparison for pharmacy coordination, adherence packaging, and delivery services  Patient Self Care Activities:  For the next 2 weeks until follow up visit:  . Monitor fasting blood glucose daily . Monitor blood pressure three days per week in the morning prior to breakfast or medications; Keep a log of all readings . Take Tylenol 500 mg on scheduled basis, every 4-6 hours and avoid all NSAIDs including ibuprofen, naproxen, etc. . Try over the counter Flonase nasal spray - 2 sprays in each nostril daily to improve allergies/reduce shortness of breath  Initial goal documentation      [name] was given  information about Chronic Care Management services today including:  1. CCM service includes personalized support from designated clinical staff supervised by his physician, including individualized plan of care and coordination with other care providers 2. 24/7 contact phone numbers for assistance for urgent and routine care needs. 3. Standard insurance, coinsurance, copays and deductibles apply for chronic care management only during months in which we provide at least 20 minutes of these services. Most insurances cover these services at 100%, however patients may be responsible for any copay, coinsurance and/or deductible if applicable. This service may help you avoid the need for more expensive face-to-face services. 4. Only one practitioner may furnish and bill the service in a calendar month. 5. The patient may stop CCM services at any time (effective at the end of the month) by phone call to the office staff.  Patient agreed to services and verbal consent obtained.   The patient verbalized understanding of instructions provided today and agreed to receive a mailed copy of patient instruction and/or educational materials. Telephone follow up appointment with pharmacy team member scheduled for: Nov 23, 2019 at 9:15 AM (telephone)  Debbora Dus, PharmD Clinical Pharmacist [facility] [phone]   Managing Your Hypertension Hypertension is commonly called high blood pressure. This is when the force of your blood pressing against the walls of your arteries is too strong. Arteries are blood vessels that carry blood from your heart throughout your body. Hypertension forces the heart to work harder to pump blood, and may cause the arteries to become narrow or stiff. Having untreated or uncontrolled hypertension can cause heart  attack, stroke, kidney disease, and other problems. What are blood pressure readings? A blood pressure reading consists of a higher number over  a lower number. Ideally, your blood pressure should be below 120/80. The first ("top") number is called the systolic pressure. It is a measure of the pressure in your arteries as your heart beats. The second ("bottom") number is called the diastolic pressure. It is a measure of the pressure in your arteries as the heart relaxes. What does my blood pressure reading mean? Blood pressure is classified into four stages. Based on your blood pressure reading, your health care provider may use the following stages to determine what type of treatment you need, if any. Systolic pressure and diastolic pressure are measured in a unit called mm Hg. Normal  Systolic pressure: below 604.  Diastolic pressure: below 80. Elevated  Systolic pressure: 540-981.  Diastolic pressure: below 80. Hypertension stage 1  Systolic pressure: 191-478.  Diastolic pressure: 29-56. Hypertension stage 2  Systolic pressure: 213 or above.  Diastolic pressure: 90 or above. What health risks are associated with hypertension? Managing your hypertension is an important responsibility. Uncontrolled hypertension can lead to:  A heart attack.  A stroke.  A weakened blood vessel (aneurysm).  Heart failure.  Kidney damage.  Eye damage.  Metabolic syndrome.  Memory and concentration problems. What changes can I make to manage my hypertension? Hypertension can be managed by making lifestyle changes and possibly by taking medicines. Your health care provider will help you make a plan to bring your blood pressure within a normal range. Eating and drinking   Eat a diet that is high in fiber and potassium, and low in salt (sodium), added sugar, and fat. An example eating plan is called the DASH (Dietary Approaches to Stop Hypertension) diet. To eat this way: ? Eat plenty of fresh fruits and vegetables. Try to fill half of your plate at each meal with fruits and vegetables. ? Eat whole grains, such as whole wheat pasta,  brown rice, or whole grain bread. Fill about one quarter of your plate with whole grains. ? Eat low-fat diary products. ? Avoid fatty cuts of meat, processed or cured meats, and poultry with skin. Fill about one quarter of your plate with lean proteins such as fish, chicken without skin, beans, eggs, and tofu. ? Avoid premade and processed foods. These tend to be higher in sodium, added sugar, and fat.  Reduce your daily sodium intake. Most people with hypertension should eat less than 1,500 mg of sodium a day.  Limit alcohol intake to no more than 1 drink a day for nonpregnant women and 2 drinks a day for men. One drink equals 12 oz of beer, 5 oz of wine, or 1 oz of hard liquor. Lifestyle  Work with your health care provider to maintain a healthy body weight, or to lose weight. Ask what an ideal weight is for you.  Get at least 30 minutes of exercise that causes your heart to beat faster (aerobic exercise) most days of the week. Activities may include walking, swimming, or biking.  Include exercise to strengthen your muscles (resistance exercise), such as weight lifting, as part of your weekly exercise routine. Try to do these types of exercises for 30 minutes at least 3 days a week.  Do not use any products that contain nicotine or tobacco, such as cigarettes and e-cigarettes. If you need help quitting, ask your health care provider.  Control any long-term (chronic) conditions you have, such  as high cholesterol or diabetes. Monitoring  Monitor your blood pressure at home as told by your health care provider. Your personal target blood pressure may vary depending on your medical conditions, your age, and other factors.  Have your blood pressure checked regularly, as often as told by your health care provider. Working with your health care provider  Review all the medicines you take with your health care provider because there may be side effects or interactions.  Talk with your health  care provider about your diet, exercise habits, and other lifestyle factors that may be contributing to hypertension.  Visit your health care provider regularly. Your health care provider can help you create and adjust your plan for managing hypertension. Will I need medicine to control my blood pressure? Your health care provider may prescribe medicine if lifestyle changes are not enough to get your blood pressure under control, and if:  Your systolic blood pressure is 130 or higher.  Your diastolic blood pressure is 80 or higher. Take medicines only as told by your health care provider. Follow the directions carefully. Blood pressure medicines must be taken as prescribed. The medicine does not work as well when you skip doses. Skipping doses also puts you at risk for problems. Contact a health care provider if:  You think you are having a reaction to medicines you have taken.  You have repeated (recurrent) headaches.  You feel dizzy.  You have swelling in your ankles.  You have trouble with your vision. Get help right away if:  You develop a severe headache or confusion.  You have unusual weakness or numbness, or you feel faint.  You have severe pain in your chest or abdomen.  You vomit repeatedly.  You have trouble breathing. Summary  Hypertension is when the force of blood pumping through your arteries is too strong. If this condition is not controlled, it may put you at risk for serious complications.  Your personal target blood pressure may vary depending on your medical conditions, your age, and other factors. For most people, a normal blood pressure is less than 120/80.  Hypertension is managed by lifestyle changes, medicines, or both. Lifestyle changes include weight loss, eating a healthy, low-sodium diet, exercising more, and limiting alcohol. This information is not intended to replace advice given to you by your health care provider. Make sure you discuss any  questions you have with your health care provider. Document Revised: 10/31/2018 Document Reviewed: 06/06/2016 Elsevier Patient Education  [place].

## 2019-11-23 ENCOUNTER — Ambulatory Visit: Payer: Medicare Other

## 2019-11-23 ENCOUNTER — Other Ambulatory Visit: Payer: Self-pay

## 2019-11-23 DIAGNOSIS — E1122 Type 2 diabetes mellitus with diabetic chronic kidney disease: Secondary | ICD-10-CM

## 2019-11-23 DIAGNOSIS — I10 Essential (primary) hypertension: Secondary | ICD-10-CM

## 2019-11-23 DIAGNOSIS — E1151 Type 2 diabetes mellitus with diabetic peripheral angiopathy without gangrene: Secondary | ICD-10-CM

## 2019-11-23 NOTE — Chronic Care Management (AMB) (Signed)
Chronic Care Management Pharmacy  Name: [name] [name]  MRN: [id number] DOB: 1956/05/31  Chief Complaint/ HPI  [name] [name],  64 y.o., male presents for their Follow-Up CCM visit with the clinical pharmacist via telephone. Unable to accurately assess med history by telephone, discussed scheduling separate visit in 2 weeks for face to face.   PCP : [name], MD  Their chronic conditions include: hypertension, hyperlipidemia, COPD, diabetes  Patient concerns: reports multiple falls in the past couples of months, contributes to imbalance and numbness/pain in feet; legs are heavy/painful  Recent hospitalizations:   11/04/19 - ED, fall   Office Visits:  10/05/19: [name] - DM controlled, cont current meds  Consult Visit:  11/10/19: Neurology - back pain, neuropathy, multiple falls, numbness in lower extremities, uses donut and heating pad, MRI ordered  11/03/19: Cardiology - long-term DAPT, cont current meds   09/02/19: Nephrology - cont current meds   Allergies  Allergen Reactions  . Contrast Media [Iodinated Diagnostic Agents] Rash and Other (See Comments)    Got very hot and red   . Glipizide Other (See Comments)    ANTIDIABETICS. Burning  . Metrizamide Other (See Comments)    Got very hot and red  . Penicillins Hives and Swelling    Has patient had a PCN reaction causing immediate rash, facial/tongue/throat swelling, SOB or lightheadedness with hypotension: yes Has patient had a PCN reaction causing severe rash involving mucus membranes or skin necrosis: no  Has patient had a PCN reaction that required hospitalization: yes Has patient had a PCN reaction occurring within the last 10 years: no If all of the above answers are "NO", then may proceed with Cephalosporin use.    Medications: Outpatient Encounter Medications as of 11/23/2019  Medication Sig  . ACCU-CHEK FASTCLIX LANCETS MISC Use to check sugar 1 time daily.  [name] albuterol (PROAIR HFA) 108 (90 Base)  MCG/ACT inhaler Inhale 2 puffs into the lungs every 6 (six) hours as needed for shortness of breath.  [name] aspirin 81 MG tablet Take 81 mg by mouth daily.  [name] buPROPion (WELLBUTRIN XL) 300 MG 24 hr tablet Take 1 tablet (300 mg total) by mouth daily.  . busPIRone (BUSPAR) 10 MG tablet TAKE 2 TABLETS(20 MG) BY MOUTH TWICE DAILY  . calcitRIOL (ROCALTROL) 0.25 MCG capsule Take 0.25 mcg by mouth 3 (three) times a week.  . carvedilol (COREG) 12.5 MG tablet Take 1 tablet (12.5 mg total) by mouth 2 (two) times daily. PLEASE SCHEDULE APPOINTMENT FOR FUTURE REFILLS.  [name] clopidogrel (PLAVIX) 75 MG tablet TAKE 1 TABLET(75 MG) BY MOUTH DAILY  . CONTOUR TEST test strip TEST EVERY DAY  . Dextran 70-Hypromellose (ARTIFICIAL TEARS) 0.1-0.3 % SOLN Place 1 drop into both eyes 4 (four) times daily as needed (dry eyes).  . finasteride (PROSCAR) 5 MG tablet Take 5 mg by mouth daily.  . furosemide (LASIX) 20 MG tablet Take 20 mg by mouth daily.  . hydroxychloroquine (PLAQUENIL) 200 MG tablet Comments:   Filled Date: Feb 19 2019 12:00AM  Patient Notes: TK 1 T PO BID  Duration: 90  . losartan (COZAAR) 100 MG tablet TAKE 1 TABLET(100 MG) BY MOUTH DAILY (Patient taking differently: Take 100 mg by mouth daily. )  . Multiple Vitamin (MULTIVITAMIN) capsule Take 1 capsule by mouth daily.   . nitroGLYCERIN (NITROSTAT) 0.4 MG SL tablet Place 1 tablet (0.4 mg total) under the tongue every 5 (five) minutes as needed.  [name] RELION INSULIN SYRINGE 31G X 15/64" 1  ML MISC ONE INJECTION THREE TIMES DAILY  . sertraline (ZOLOFT) 100 MG tablet Take 2 tablets (200 mg total) by mouth daily.  . simvastatin (ZOCOR) 20 MG tablet TAKE 1 TABLET(20 MG) BY MOUTH DAILY AT 6 PM  . tamsulosin (FLOMAX) 0.4 MG CAPS capsule take 2 capsule by mouth daily   No facility-administered encounter medications on file as of 11/23/2019.   Current Diagnosis/Assessment:  Goals    . Pharmacy Care Plan     CARE PLAN ENTRY  Current Barriers:  . Chronic Disease  Management support, education, and care coordination needs related to diabetes, hypertension, COPD, allergies, neuropathy  Pharmacist Clinical Goal(s):  [name] Over the next 2 weeks, patient will work with PharmD and primary care provider to address the following goals: o Diabetes: Maintain A1c goal of less than 7% with fasting blood glucose within 80-130 mg/dL and blood glucose 1-2 hours after meals less than 180 mg/dL; prevent hypoglycemia (blood glucose less than 70 mg/dL) o Hypertension: Maintain blood pressure goal of less than 140/90 mmHg o Neuropathy/Low Back Pain: Improve pain and discontinue inappropriate medications; Increase Tylenol and stop ibuprofen. o COPD/Allergies: Improve shortness of breath o Vaccinations: Remain up to date on vaccinations. Recommend 2-dose series of shingles vaccine (Shingrix)  Interventions: . Comprehensive medication review performed . Review cost comparison for pharmacy coordination, adherence packaging, and delivery services  Patient Self Care Activities:  For the next 2 weeks until follow up visit:  . Monitor fasting blood glucose daily . Monitor blood pressure three days per week in the morning prior to breakfast or medications; Keep a log of all readings . Take Tylenol 500 mg on scheduled basis, every 4-6 hours and avoid all NSAIDs including ibuprofen, naproxen, etc. . Try over the counter Flonase nasal spray - 2 sprays in each nostril daily to improve allergies/reduce shortness of breath  Initial goal documentation        Financial Resource Strain: Low Risk   . Difficulty of Paying Living Expenses: Not very hard    Hyperlipidemia/CAD   Lipid Panel     Component Value Date/Time   CHOL 81 04/07/2019 1201   CHOL 116 11/18/2017 1002   TRIG 103.0 04/07/2019 1201   HDL 26.80 (L) 04/07/2019 1201   HDL 30 (L) 11/18/2017 1002   CHOLHDL 3 04/07/2019 1201   VLDL 20.6 04/07/2019 1201   LDLCALC 34 04/07/2019 1201   LDLCALC 51 11/18/2017 1002    LDLDIRECT 61.0 03/15/2017 1021   LABVLDL 35 11/18/2017 1002    CBC Latest Ref Rng & Units 11/04/2019 04/07/2019 10/21/2018  WBC 4.0 - 10.5 K/uL 8.7 7.3 -  Hemoglobin 13.0 - 17.0 g/dL 11.9(L) 13.1 11.2(A)  Hematocrit 39.0 - 52.0 % 36.7(L) 39.5 -  Platelets 150 - 400 K/uL 143(L) 167.0 145(A)   LDL goal < 70 Patient has failed these meds in past: Vascepa (cost) Patient is currently controlled on the following medications:   Simvastatin 20 mg - 1 tablet daily at 6 PM (confirmed)  Aspirin 81 mg - 1 tablet daily (confirmed)  Plavix 75 mg - 1 tablet daily (confirmed)  We discussed: confirmed adherence; long-term DAPT per cardio  Plan: Continue current medications  Hypertension   CMP Latest Ref Rng & Units 11/04/2019 06/09/2019 04/07/2019  Glucose 70 - 99 mg/dL 110(H) - 85  BUN 8 - 23 mg/dL 44(H) 45(H) 50(H)  Creatinine 0.61 - 1.24 mg/dL 3.62(H) 2.76(H) 3.30(H)  Sodium 135 - 145 mmol/L 137 - 137  Potassium 3.5 - 5.1 mmol/L 4.7 -  5.3(H)  Chloride 98 - 111 mmol/L 103 - 101  CO2 22 - 32 mmol/L 22 - 29  Calcium 8.9 - 10.3 mg/dL 8.9 - 8.9  Total Protein 6.5 - 8.1 g/dL 8.4(H) - 7.2  Total Bilirubin 0.3 - 1.2 mg/dL 1.0 - 0.6  Alkaline Phos 38 - 126 U/L 94 - 80  AST 15 - 41 U/L 16 - 12  ALT 0 - 44 U/L 15 - 12   Office blood pressures are: BP Readings from Last 3 Encounters:  11/16/19 114/70  11/04/19 (!) 170/73  11/03/19 140/84   BP goal < 140/90 mmHg Patient has failed these meds in the past: none reported Patient checks BP at home infrequently, has home monitor, wrist cuff  Patient home BP readings are ranging:  "170/32", 140/42"  Patient is currently uncontrolled on the following medications:   Carvedilol 12.5 mg - 1 tablet BID (confirmed)  Furosemide 20 mg - 1 tablet daily at night (confirmed)  Losartan 100 mg - 1 tablet daily (confirmed)  We discussed: prefers to take lasix at night to avoid frequent urination during the day/appts; Wears compression stockings daily;  self-reported BP likely inaccurate, asked pt to bring written log to next appt   Plan: Continue current medications; Check BP 3 days per week for the next 2 weeks. Continue to avoid NSAIDs and limit sodium.   COPD / Tobacco   Last spirometry score: none per chart  Eosinophil count:   Lab Results  Component Value Date/Time   EOSPCT 3.7 03/15/2017 10:21 AM  %                               Eos (Absolute):  Lab Results  Component Value Date/Time   EOSABS 0.2 03/15/2017 10:21 AM   Tobacco Status:  Social History   Tobacco Use  Smoking Status Former Smoker  . Packs/day: 0.75  . Years: 40.00  . Pack years: 30.00  . Types: Cigarettes  . Quit date: 04/15/2017  . Years since quitting: 2.6  Smokeless Tobacco Never Used  Tobacco Comment   1 cigarette a day   Patient has failed these meds in past: none  Patient is currently controlled on the following medications:   Albuterol 90 mcg - Inhale 2 puffs q6h PRN   Zyrtec 10 mg - 1 tablet as needed  Using maintenance inhaler regularly? None prescribed Frequency of rescue inhaler use:  3-5x times per week (recent increase, usually increases around this time of year)  We discussed: primarily allergy related; may benefit from Flonase nasal spray   Plan: Continue current medications; Recommend OTC Flonase nasal spray 2 sprays in each nostril during allergy season.  Diabetes   Recent Relevant Labs: Lab Results  Component Value Date/Time   HGBA1C 5.7 (A) 10/05/2019 11:26 AM   HGBA1C 6.1 (A) 02/11/2019 08:07 AM   HGBA1C 5.9 06/11/2016 09:56 AM   HGBA1C 6.2 03/06/2016 11:11 AM   MICROALBUR <0.7 12/13/2015 08:46 AM   MICROALBUR 38.0 (H) 06/14/2015 08:01 AM    A1c < 7% Checking BG: Daily (time of day varies)  Patient has failed these meds in past: none Patient is currently controlled on the following medications:   Insulin NPH (Novolin N) - 15 units once daily  Insulin regular (Novolin R) - 15 units once daily  **Last visit:  Denied taking insulin in 1-2 months; only takes if BG is > 230 Reports took insulin on Sunday 5/2 -->  checked glucose on Friday AM - 141, reports didn't feel well on Sunday - checked BG --> 238 mg/dL took 10 units of each insulin  Checked this morning: 121   Last diabetic eye exam:  Lab Results  Component Value Date/Time   HMDIABEYEEXA Retinopathy (A) 04/13/2019 12:00 AM    Last diabetic foot exam:  Lab Results  Component Value Date/Time   HMDIABFOOTEX done 07/02/2019 12:00 AM    We discussed: diet - daughter in law cooks healthy meals; reports very aware of when he has highs or lows  Plan: Continue current medications; Follow up face to face visit in 2 weeks to review glucometer readings/discuss appropriate insulin use.   Depression   Patient has tried these meds in past: none reported Patient is currently controlled on the following medications:   Duloxetine 60 mg - 1 tablet BID   Bupropion 300 mg - 1 tablet daily   Buspirone 10 mg - 2 tablets BID  Sertraline 100 mg - 2 tablets daily (takes 1 tab BID)  Diazepam 10 mg - 1 tablet TID (reports not working - not on [employer])  We discussed: wife passed this year and recently gave up driving; situational stressors, doing okay  Renal dose recommendations:  - Caution with duloxetine in CrCl < 30; avoid use or limit to 60 mg/day  - Caution with bupropion in CrCl 15-50; limit dose to 150 mg/day  - Caution with buspirone, avoid use in severe renal impairment  OTC options: alpha lipoic acids supplement 600 mg daily, lidocaine patch, capsaicin cream  Plan: Continue current medications; Discuss renal dose adjustments at office visit.   CKD   Scr: 3.62, GFR 16 ml/min (11/04/19) Adjusted body weight: 82 kg CrCl: 23 ml/min (adj. bw)  30 ml/min (actual bw) He is followed by Dr. [name] Patient has failed these meds in past: none reported Patient is currently on the following medications:   Calcitriol 0.25 mcg - 1 tablet three  times a week (M,W,F - confirmed)  Plan: Continue current medications  BPH   Patient has failed these meds in past: none reported Patient is currently controlled on the following medications:   Tamsulosin 0.4 mg - 2 caps daily (comfirmed) - 1 mid morning/1 at night  Finasteride 5 mg - 1 tab daily (confirmed)  We discussed: denies current problems with urinary frequency; no renal dose adjustments recommended; may consider decreasing tamsulosin due to falls   Plan: Continue current medications  Neuropathy/Low back pain    Patient has failed these meds in past:  Patient is currently uncontrolled on the following medications:   Duloxetine 60 mg - 1 tablet BID   Cyclobenzaprine 10 mg - 1 tablet PRN   Ibuprofen 200 mg - takes up to 4 tablets a day, 1 every 6 hours  Tylenol 500 mg PRN   We discussed: pt reports he switched to extra strength Tylenol, denies NSAID use this week  Plan: Continue current medications; Continue scheduled Tylenol 500 mg every 4-6 hours and strongly recommend avoiding NSAIDs due to renal function.   Falls   We discussed: Pt reports nurse visited this week and he is starting PT soon. Also has requested assistance with bathing.   Plan: Continue to review medications for fall risk. Recommend slow taper of diazepam at next visit. Review meds for de-prescribing.   Vaccines   Reviewed and discussed patient's vaccination history.    Immunization History  Administered Date(s) Administered  . Fluad Quad(high Dose 65+) 04/07/2019  . Influenza  Split 04/12/2011, 04/29/2012  . Influenza,inj,Quad PF,6+ Mos 05/18/2013, 03/19/2014, 03/14/2015, 05/01/2017, 03/25/2018  . Influenza-Unspecified 03/13/2016  . PFIZER SARS-COV-2 Vaccination 09/17/2019, 10/08/2019  . Pneumococcal Conjugate-13 03/15/2017  . Pneumococcal Polysaccharide-23 07/03/2012, 10/09/2013  . Tdap 08/06/2009, 04/24/2018  . Zoster 11/24/2010   Plan: Recommended patient receive Shingrix  Medication  Management  Misc: nitroglycerin 0.4 mg - 1 tablet PRN, hydroxychloroquine - not taking   OTCs: multivitamin  Pharmacy/Benefits: Medicare ([employer], [place] (daughter in law picks up), working well; would like to check cost with [employer]   Social support: daughter-in-law provides transportation   Affordability: denies concerns  CCM Follow Up: Dec 03, 2019 at 9:00 AM (face-to-face)  Debbora Dus, PharmD Clinical Pharmacist [facility] [phone]

## 2019-11-24 ENCOUNTER — Telehealth: Payer: Self-pay | Admitting: *Deleted

## 2019-11-24 DIAGNOSIS — I10 Essential (primary) hypertension: Secondary | ICD-10-CM | POA: Diagnosis not present

## 2019-11-24 DIAGNOSIS — E1122 Type 2 diabetes mellitus with diabetic chronic kidney disease: Secondary | ICD-10-CM | POA: Diagnosis not present

## 2019-11-24 DIAGNOSIS — N184 Chronic kidney disease, stage 4 (severe): Secondary | ICD-10-CM | POA: Diagnosis not present

## 2019-11-24 DIAGNOSIS — I25110 Atherosclerotic heart disease of native coronary artery with unstable angina pectoris: Secondary | ICD-10-CM | POA: Diagnosis not present

## 2019-11-24 DIAGNOSIS — E1151 Type 2 diabetes mellitus with diabetic peripheral angiopathy without gangrene: Secondary | ICD-10-CM | POA: Diagnosis not present

## 2019-11-24 DIAGNOSIS — E1140 Type 2 diabetes mellitus with diabetic neuropathy, unspecified: Secondary | ICD-10-CM | POA: Diagnosis not present

## 2019-11-24 NOTE — Telephone Encounter (Signed)
[name] [name] has been notified ok verbal orders.

## 2019-11-24 NOTE — Telephone Encounter (Signed)
Verbal orders given to Esther. 

## 2019-11-24 NOTE — Telephone Encounter (Signed)
[name] with [name] left a voicemail requesting verbal orders for OT, [name] [name] is requesting twice week for two weeks and once a week for one week. [name] [name] requested a call back with verbal orders.

## 2019-11-24 NOTE — Telephone Encounter (Signed)
Verbal orders given to [name] 

## 2019-11-24 NOTE — Telephone Encounter (Signed)
That sounds fine

## 2019-11-24 NOTE — Telephone Encounter (Signed)
[name] [name] with [name] called requesting verbal orders for nursing. [name] [name] is requesting Once a week for one week Twice a week for two weeks Once a week for 6 weeks And 2 prn visits. When calling back [phone]. [phone]

## 2019-11-25 ENCOUNTER — Telehealth: Payer: Self-pay

## 2019-11-25 DIAGNOSIS — I10 Essential (primary) hypertension: Secondary | ICD-10-CM | POA: Diagnosis not present

## 2019-11-25 DIAGNOSIS — E1151 Type 2 diabetes mellitus with diabetic peripheral angiopathy without gangrene: Secondary | ICD-10-CM | POA: Diagnosis not present

## 2019-11-25 DIAGNOSIS — E1140 Type 2 diabetes mellitus with diabetic neuropathy, unspecified: Secondary | ICD-10-CM | POA: Diagnosis not present

## 2019-11-25 DIAGNOSIS — E1122 Type 2 diabetes mellitus with diabetic chronic kidney disease: Secondary | ICD-10-CM | POA: Diagnosis not present

## 2019-11-25 DIAGNOSIS — I25110 Atherosclerotic heart disease of native coronary artery with unstable angina pectoris: Secondary | ICD-10-CM | POA: Diagnosis not present

## 2019-11-25 DIAGNOSIS — N184 Chronic kidney disease, stage 4 (severe): Secondary | ICD-10-CM | POA: Diagnosis not present

## 2019-11-25 NOTE — Telephone Encounter (Signed)
That is fine 

## 2019-11-25 NOTE — Telephone Encounter (Signed)
Erline Levine, PT with [facility] contacted the office needing verbal orders for PT services for 1x a week for 1 week, 2x a week for 3 weeks, and 1x a week for 3 weeks.  She can be reached back at [phone] and a secure message can be left if she does not answer.

## 2019-11-25 NOTE — Telephone Encounter (Signed)
Verbal orders given to Stacy 

## 2019-11-27 ENCOUNTER — Other Ambulatory Visit: Payer: Medicare Other

## 2019-11-27 ENCOUNTER — Ambulatory Visit: Payer: Medicare Other

## 2019-11-27 ENCOUNTER — Telehealth: Payer: Self-pay

## 2019-11-27 DIAGNOSIS — I25110 Atherosclerotic heart disease of native coronary artery with unstable angina pectoris: Secondary | ICD-10-CM | POA: Diagnosis not present

## 2019-11-27 DIAGNOSIS — N184 Chronic kidney disease, stage 4 (severe): Secondary | ICD-10-CM | POA: Diagnosis not present

## 2019-11-27 DIAGNOSIS — E1122 Type 2 diabetes mellitus with diabetic chronic kidney disease: Secondary | ICD-10-CM | POA: Diagnosis not present

## 2019-11-27 DIAGNOSIS — I10 Essential (primary) hypertension: Secondary | ICD-10-CM | POA: Diagnosis not present

## 2019-11-27 DIAGNOSIS — E1140 Type 2 diabetes mellitus with diabetic neuropathy, unspecified: Secondary | ICD-10-CM | POA: Diagnosis not present

## 2019-11-27 DIAGNOSIS — E1151 Type 2 diabetes mellitus with diabetic peripheral angiopathy without gangrene: Secondary | ICD-10-CM | POA: Diagnosis not present

## 2019-11-27 NOTE — Telephone Encounter (Signed)
Olin Hauser, nurse with [facility], called wanted to let us know patient is concerned with his recurrent falls and memory loss. Olin Hauser wonders/suggests neurology referral for patient. If any questions for Olin Hauser her CB is [phone]

## 2019-11-28 NOTE — Telephone Encounter (Signed)
Please let her know that he has seen a neurosurgeon--and both these issues are not new. We can certainly review things at his next visit

## 2019-11-30 NOTE — Telephone Encounter (Signed)
Spoke to [employer].

## 2019-12-01 DIAGNOSIS — E1140 Type 2 diabetes mellitus with diabetic neuropathy, unspecified: Secondary | ICD-10-CM | POA: Diagnosis not present

## 2019-12-01 DIAGNOSIS — I10 Essential (primary) hypertension: Secondary | ICD-10-CM | POA: Diagnosis not present

## 2019-12-01 DIAGNOSIS — N184 Chronic kidney disease, stage 4 (severe): Secondary | ICD-10-CM | POA: Diagnosis not present

## 2019-12-01 DIAGNOSIS — E1151 Type 2 diabetes mellitus with diabetic peripheral angiopathy without gangrene: Secondary | ICD-10-CM | POA: Diagnosis not present

## 2019-12-01 DIAGNOSIS — I25110 Atherosclerotic heart disease of native coronary artery with unstable angina pectoris: Secondary | ICD-10-CM | POA: Diagnosis not present

## 2019-12-01 DIAGNOSIS — E1122 Type 2 diabetes mellitus with diabetic chronic kidney disease: Secondary | ICD-10-CM | POA: Diagnosis not present

## 2019-12-02 DIAGNOSIS — K5790 Diverticulosis of intestine, part unspecified, without perforation or abscess without bleeding: Secondary | ICD-10-CM

## 2019-12-02 DIAGNOSIS — I10 Essential (primary) hypertension: Secondary | ICD-10-CM | POA: Diagnosis not present

## 2019-12-02 DIAGNOSIS — E1151 Type 2 diabetes mellitus with diabetic peripheral angiopathy without gangrene: Secondary | ICD-10-CM | POA: Diagnosis not present

## 2019-12-02 DIAGNOSIS — F329 Major depressive disorder, single episode, unspecified: Secondary | ICD-10-CM

## 2019-12-02 DIAGNOSIS — Z87891 Personal history of nicotine dependence: Secondary | ICD-10-CM

## 2019-12-02 DIAGNOSIS — R296 Repeated falls: Secondary | ICD-10-CM

## 2019-12-02 DIAGNOSIS — I6529 Occlusion and stenosis of unspecified carotid artery: Secondary | ICD-10-CM | POA: Diagnosis not present

## 2019-12-02 DIAGNOSIS — E669 Obesity, unspecified: Secondary | ICD-10-CM

## 2019-12-02 DIAGNOSIS — E785 Hyperlipidemia, unspecified: Secondary | ICD-10-CM | POA: Diagnosis not present

## 2019-12-02 DIAGNOSIS — N2581 Secondary hyperparathyroidism of renal origin: Secondary | ICD-10-CM | POA: Diagnosis not present

## 2019-12-02 DIAGNOSIS — F419 Anxiety disorder, unspecified: Secondary | ICD-10-CM

## 2019-12-02 DIAGNOSIS — D126 Benign neoplasm of colon, unspecified: Secondary | ICD-10-CM

## 2019-12-02 DIAGNOSIS — E1140 Type 2 diabetes mellitus with diabetic neuropathy, unspecified: Secondary | ICD-10-CM | POA: Diagnosis not present

## 2019-12-02 DIAGNOSIS — L4050 Arthropathic psoriasis, unspecified: Secondary | ICD-10-CM | POA: Diagnosis not present

## 2019-12-02 DIAGNOSIS — J449 Chronic obstructive pulmonary disease, unspecified: Secondary | ICD-10-CM | POA: Diagnosis not present

## 2019-12-02 DIAGNOSIS — W19XXXD Unspecified fall, subsequent encounter: Secondary | ICD-10-CM

## 2019-12-02 DIAGNOSIS — E1122 Type 2 diabetes mellitus with diabetic chronic kidney disease: Secondary | ICD-10-CM | POA: Diagnosis not present

## 2019-12-02 DIAGNOSIS — I25110 Atherosclerotic heart disease of native coronary artery with unstable angina pectoris: Secondary | ICD-10-CM | POA: Diagnosis not present

## 2019-12-02 DIAGNOSIS — Z6837 Body mass index (BMI) 37.0-37.9, adult: Secondary | ICD-10-CM

## 2019-12-02 DIAGNOSIS — D369 Benign neoplasm, unspecified site: Secondary | ICD-10-CM

## 2019-12-02 DIAGNOSIS — M069 Rheumatoid arthritis, unspecified: Secondary | ICD-10-CM | POA: Diagnosis not present

## 2019-12-02 DIAGNOSIS — Z9181 History of falling: Secondary | ICD-10-CM

## 2019-12-02 DIAGNOSIS — N184 Chronic kidney disease, stage 4 (severe): Secondary | ICD-10-CM | POA: Diagnosis not present

## 2019-12-02 DIAGNOSIS — I129 Hypertensive chronic kidney disease with stage 1 through stage 4 chronic kidney disease, or unspecified chronic kidney disease: Secondary | ICD-10-CM | POA: Diagnosis not present

## 2019-12-02 DIAGNOSIS — G4730 Sleep apnea, unspecified: Secondary | ICD-10-CM

## 2019-12-02 DIAGNOSIS — R011 Cardiac murmur, unspecified: Secondary | ICD-10-CM

## 2019-12-02 DIAGNOSIS — I350 Nonrheumatic aortic (valve) stenosis: Secondary | ICD-10-CM | POA: Diagnosis not present

## 2019-12-02 DIAGNOSIS — R809 Proteinuria, unspecified: Secondary | ICD-10-CM | POA: Diagnosis not present

## 2019-12-03 ENCOUNTER — Telehealth: Payer: Self-pay

## 2019-12-03 ENCOUNTER — Ambulatory Visit: Payer: Medicare Other

## 2019-12-03 ENCOUNTER — Other Ambulatory Visit: Payer: Self-pay

## 2019-12-03 DIAGNOSIS — E1151 Type 2 diabetes mellitus with diabetic peripheral angiopathy without gangrene: Secondary | ICD-10-CM | POA: Diagnosis not present

## 2019-12-03 DIAGNOSIS — I10 Essential (primary) hypertension: Secondary | ICD-10-CM

## 2019-12-03 DIAGNOSIS — E1140 Type 2 diabetes mellitus with diabetic neuropathy, unspecified: Secondary | ICD-10-CM | POA: Diagnosis not present

## 2019-12-03 DIAGNOSIS — F330 Major depressive disorder, recurrent, mild: Secondary | ICD-10-CM

## 2019-12-03 DIAGNOSIS — J440 Chronic obstructive pulmonary disease with acute lower respiratory infection: Secondary | ICD-10-CM

## 2019-12-03 DIAGNOSIS — E1122 Type 2 diabetes mellitus with diabetic chronic kidney disease: Secondary | ICD-10-CM | POA: Diagnosis not present

## 2019-12-03 DIAGNOSIS — J209 Acute bronchitis, unspecified: Secondary | ICD-10-CM

## 2019-12-03 DIAGNOSIS — I25110 Atherosclerotic heart disease of native coronary artery with unstable angina pectoris: Secondary | ICD-10-CM | POA: Diagnosis not present

## 2019-12-03 DIAGNOSIS — N184 Chronic kidney disease, stage 4 (severe): Secondary | ICD-10-CM | POA: Diagnosis not present

## 2019-12-03 MED ORDER — DULOXETINE HCL 60 MG PO CPEP: 60.0000 mg | ORAL_CAPSULE | Freq: Every day | ORAL | 3 refills | Status: DC

## 2019-12-03 MED ORDER — BUPROPION HCL ER (XL) 150 MG PO TB24: 150.0000 mg | ORAL_TABLET | Freq: Every day | ORAL | 3 refills | Status: DC

## 2019-12-03 NOTE — Assessment & Plan Note (Signed)
Does well with the CPAP every night

## 2019-12-03 NOTE — Patient Instructions (Addendum)
Dec 03, 2019  Dear [name],  Below is a summary of the goals we discussed at our appointment on Dec 03, 2019. Please contact me anytime with questions or concerns.   Visit Information  Goals Addressed            This Visit's Progress   . Pharmacy Care Plan       CARE PLAN ENTRY  Current Barriers:  . Chronic Disease Management support, education, and care coordination needs related to diabetes, hypertension, COPD, allergies, chronic kidney disease, neuropathy  Pharmacist Clinical Goal(s):  [name] Over the next 2 weeks, patient will work with PharmD and primary care provider to address the following goals: o Diabetes: Maintain A1c goal of less than 7% with fasting blood glucose within 80-130 mg/dL and blood glucose 1-2 hours after meals less than 180 mg/dL; prevent hypoglycemia (blood glucose less than 70 mg/dL) o Hypertension: Maintain blood pressure goal of less than 140/90 mmHg; Monitor for orthostatic hypotension.  o Neuropathy/Low Back Pain: Improve pain and discontinue inappropriate medications o COPD/Allergies: Improve shortness of breath o Chronic Kidney Disease: Ensure medications are dosed appropriately for kidney function and avoid side effects o Vaccinations: Remain up to date on vaccinations. Recommend 2-dose series of shingles vaccine (Shingrix)  Interventions: . Comprehensive medication review performed . Coordinate pharmacy delivery services through [facility] . Consulted with PCP to reduce dose of duloxetine and bupropion per renal function   Patient Self Care Activities:  For the next 4 weeks until follow up visit:  . Monitor fasting blood glucose three times daily, document, and provide at next appointment . Purchase a home blood pressure monitor that goes around the upper arm. Monitor blood pressure three days per week in the morning prior to breakfast and medications, document, and provide at next appointment . Take Tylenol 500 mg on scheduled  basis, 1 tablet every 4 hours for pain  . Avoid all non-steroidal anti-inflammatory drugs including ibuprofen (Advil), naproxen (Aleve), Goody's powder, Excedrin. . Try over the counter Flonase nasal spray - 2 sprays in each nostril daily to improve allergies/reduce shortness of breath . Call pharmacist, Sharyn Lull, with any medication refill requests   Updated goal documentation      The patient verbalized understanding of instructions provided today and agreed to receive a mailed copy of patient instruction and/or educational materials. Telephone follow up appointment with pharmacy team member scheduled for:  January 01, 2020 at 2:00 PM (telephone)  Verbal consent obtained for [facility] enhanced pharmacy services (medication synchronization, adherence packaging, delivery coordination). A medication sync plan was created to allow patient to get all medications delivered once every 30 to 90 days per patient preference. Patient understands they have freedom to choose pharmacy and clinical pharmacist will coordinate care between all prescribers and [facility].  Debbora Dus, PharmD Clinical Pharmacist [facility] [place]  [place] refers to food and lifestyle choices that are based on the traditions of countries located on the [place]. This way of eating has been shown to help prevent certain conditions and improve outcomes for people who have chronic diseases, like kidney disease and heart disease. What are tips for following this plan? Lifestyle  Cook and eat meals together with your family, when possible.  Drink enough fluid to keep your urine clear or pale yellow.  Be physically active every day. This includes: ? Aerobic exercise like running or swimming. ? Leisure activities like gardening, walking, or housework.  Get  7-8 hours of sleep each night.  If recommended by your health care provider,  drink red wine in moderation. This means 1 glass a day for nonpregnant women and 2 glasses a day for men. A glass of wine equals 5 oz (150 mL). Reading food labels   Check the serving size of packaged foods. For foods such as rice and pasta, the serving size refers to the amount of cooked product, not dry.  Check the total fat in packaged foods. Avoid foods that have saturated fat or trans fats.  Check the ingredients list for added sugars, such as corn syrup. Shopping  At the grocery store, buy most of your food from the areas near the walls of the store. This includes: ? Fresh fruits and vegetables (produce). ? Grains, beans, nuts, and seeds. Some of these may be available in unpackaged forms or large amounts (in bulk). ? Fresh seafood. ? Poultry and eggs. ? Low-fat dairy products.  Buy whole ingredients instead of prepackaged foods.  Buy fresh fruits and vegetables in-season from local farmers markets.  Buy frozen fruits and vegetables in resealable bags.  If you do not have access to quality fresh seafood, buy precooked frozen shrimp or canned fish, such as tuna, salmon, or sardines.  Buy small amounts of raw or cooked vegetables, salads, or olives from the deli or salad bar at your store.  Stock your pantry so you always have certain foods on hand, such as olive oil, canned tuna, canned tomatoes, rice, pasta, and beans. Cooking  Cook foods with extra-virgin olive oil instead of using butter or other vegetable oils.  Have meat as a side dish, and have vegetables or grains as your main dish. This means having meat in small portions or adding small amounts of meat to foods like pasta or stew.  Use beans or vegetables instead of meat in common dishes like chili or lasagna.  Experiment with different cooking methods. Try roasting or broiling vegetables instead of steaming or sauteing them.  Add frozen vegetables to soups, stews, pasta, or rice.  Add nuts or seeds for added  healthy fat at each meal. You can add these to yogurt, salads, or vegetable dishes.  Marinate fish or vegetables using olive oil, lemon juice, garlic, and fresh herbs. Meal planning   Plan to eat 1 vegetarian meal one day each week. Try to work up to 2 vegetarian meals, if possible.  Eat seafood 2 or more times a week.  Have healthy snacks readily available, such as: ? Vegetable sticks with hummus. ? [place] yogurt. ? Fruit and nut trail mix.  Eat balanced meals throughout the week. This includes: ? Fruit: 2-3 servings a day ? Vegetables: 4-5 servings a day ? Low-fat dairy: 2 servings a day ? Fish, poultry, or lean meat: 1 serving a day ? Beans and legumes: 2 or more servings a week ? Nuts and seeds: 1-2 servings a day ? Whole grains: 6-8 servings a day ? Extra-virgin olive oil: 3-4 servings a day  Limit red meat and sweets to only a few servings a month What are my food choices?  Mediterranean diet ? Recommended  Grains: Whole-grain pasta. Brown rice. Bulgar wheat. Polenta. Couscous. Whole-wheat bread. [place].  Vegetables: Artichokes. Beets. Broccoli. Cabbage. Carrots. Eggplant. Green beans. Chard. Kale. Spinach. Onions. Leeks. Peas. Squash. Tomatoes. Peppers. Radishes.  Fruits: Apples. Apricots. Avocado. Berries. Bananas. Cherries. Dates. Figs. Grapes. Lemons. Melon. Oranges. Peaches. Plums. Pomegranate.  Meats and other protein foods: Beans. Almonds. Sunflower  seeds. Pine nuts. Peanuts. [place]. Salmon. Scallops. Shrimp. Arlee. Tilapia. Clams. Oysters. Eggs.  Dairy: Low-fat milk. Cheese. Greek yogurt.  Beverages: Water. Red wine. Herbal tea.  Fats and oils: Extra virgin olive oil. Avocado oil. Grape seed oil.  Sweets and desserts: [place] yogurt with honey. Baked apples. Poached pears. Trail mix.  Seasoning and other foods: Basil. Cilantro. Coriander. Cumin. Mint. Parsley. Sage. Rosemary. Tarragon. Garlic. Oregano. Thyme. Pepper. Balsalmic vinegar. Tahini. Hummus.  Tomato sauce. Olives. Mushrooms. ? Limit these  Grains: Prepackaged pasta or rice dishes. Prepackaged cereal with added sugar.  Vegetables: Deep fried potatoes (french fries).  Fruits: Fruit canned in syrup.  Meats and other protein foods: Beef. Pork. Lamb. Poultry with skin. Hot dogs. Berniece Salines.  Dairy: Ice cream. Sour cream. Whole milk.  Beverages: Juice. Sugar-sweetened soft drinks. Beer. Liquor and spirits.  Fats and oils: Butter. Canola oil. Vegetable oil. Beef fat (tallow). Lard.  Sweets and desserts: Cookies. Cakes. Pies. Candy.  Seasoning and other foods: Mayonnaise. Premade sauces and marinades. The items listed may not be a complete list. Talk with your dietitian about what dietary choices are right for you. Summary  The Mediterranean diet includes both food and lifestyle choices.  Eat a variety of fresh fruits and vegetables, beans, nuts, seeds, and whole grains.  Limit the amount of red meat and sweets that you eat.  Talk with your health care provider about whether it is safe for you to drink red wine in moderation. This means 1 glass a day for nonpregnant women and 2 glasses a day for men. A glass of wine equals 5 oz (150 mL). This information is not intended to replace advice given to you by your health care provider. Make sure you discuss any questions you have with your health care provider. Document Revised: 03/08/2016 Document Reviewed: 03/01/2016 Elsevier Patient Education  [place].

## 2019-12-03 NOTE — Telephone Encounter (Signed)
PCP consult regarding CCM visit 12/03/19:  Discussed the following dose adjustment recommendations with patient and he is agreeable to reducing dosages pending PCP approval.   Most recent CrCl: 23 ml/min (adjusted body weight and Scr: 3.62) - Duloxetine in CrCl < 30; avoid use or limit to 60 mg/day - Bupropion in CrCl 15-50; limit dose to 150 mg/day  He is currently taking duloxetine 60 mg BID, would recommend reducing to once daily and bupropion 300 mg once daily, would recommend reducing to 150 mg daily. If approved, please send new prescriptions to [facility].  Thanks!  Debbora Dus, PharmD Clinical Pharmacist [facility] [phone]

## 2019-12-03 NOTE — Telephone Encounter (Signed)
I agree with those changes.  Larene [place], Please send Rx for 1 year at the new doses to [facility]

## 2019-12-03 NOTE — Telephone Encounter (Signed)
New rxs sent to [facility]

## 2019-12-03 NOTE — Chronic Care Management (AMB) (Signed)
Chronic Care Management Pharmacy  Name: [name] [name]  MRN: [id number] DOB: 1955/08/13  Chief Complaint/ HPI  [name] [name],  64 y.o., male presents for their Follow-Up CCM visit with the clinical pharmacist In office. Patient's son, Waunita Schooner, assisted patient with appointment.   PCP : [name], MD  Their chronic conditions include: hypertension, hyperlipidemia, COPD, diabetes  Recent hospitalizations:   11/04/19 - ED, fall   Office Visits:  10/05/19: [name] [name] - DM controlled, cont current meds  Consult Visit:  12/02/19: Nephrology - repeat labwork today, cont losartan, kidney education class, orthostasis decrease tamsulosin to once at night,   11/10/19: Neurology - back pain, neuropathy, multiple falls, numbness in lower extremities, uses donut and heating pad, MRI ordered  11/03/19: Cardiology - long-term DAPT, cont current meds   09/02/19: Nephrology - cont current meds   Allergies  Allergen Reactions  . Contrast Media [Iodinated Diagnostic Agents] Rash and Other (See Comments)    Got very hot and red   . Glipizide Other (See Comments)    ANTIDIABETICS. Burning  . Metrizamide Other (See Comments)    Got very hot and red  . Penicillins Hives and Swelling    Has patient had a PCN reaction causing immediate rash, facial/tongue/throat swelling, SOB or lightheadedness with hypotension: yes Has patient had a PCN reaction causing severe rash involving mucus membranes or skin necrosis: no  Has patient had a PCN reaction that required hospitalization: yes Has patient had a PCN reaction occurring within the last 10 years: no If all of the above answers are "NO", then may proceed with Cephalosporin use.    Medications: Outpatient Encounter Medications as of 12/03/2019  Medication Sig  . ACCU-CHEK FASTCLIX LANCETS MISC Use to check sugar 1 time daily.  [name] albuterol (PROAIR HFA) 108 (90 Base) MCG/ACT inhaler Inhale 2 puffs into the lungs every 6 (six) hours as needed for  shortness of breath.  [name] aspirin 81 MG tablet Take 81 mg by mouth daily.  . busPIRone (BUSPAR) 10 MG tablet TAKE 2 TABLETS(20 MG) BY MOUTH TWICE DAILY  . calcitRIOL (ROCALTROL) 0.25 MCG capsule Take 0.25 mcg by mouth 3 (three) times a week.  . carvedilol (COREG) 12.5 MG tablet Take 1 tablet (12.5 mg total) by mouth 2 (two) times daily. PLEASE SCHEDULE APPOINTMENT FOR FUTURE REFILLS.  [name] clopidogrel (PLAVIX) 75 MG tablet TAKE 1 TABLET(75 MG) BY MOUTH DAILY  . CONTOUR TEST test strip TEST EVERY DAY  . Dextran 70-Hypromellose (ARTIFICIAL TEARS) 0.1-0.3 % SOLN Place 1 drop into both eyes 4 (four) times daily as needed (dry eyes).  . finasteride (PROSCAR) 5 MG tablet Take 5 mg by mouth daily.  . furosemide (LASIX) 20 MG tablet Take 20 mg by mouth daily.  [name] losartan (COZAAR) 100 MG tablet TAKE 1 TABLET(100 MG) BY MOUTH DAILY  . RELION INSULIN SYRINGE 31G X 15/64" 1 ML MISC ONE INJECTION THREE TIMES DAILY  . sertraline (ZOLOFT) 100 MG tablet Take 2 tablets (200 mg total) by mouth daily.  . simvastatin (ZOCOR) 20 MG tablet TAKE 1 TABLET(20 MG) BY MOUTH DAILY AT 6 PM  . tamsulosin (FLOMAX) 0.4 MG CAPS capsule Take by mouth.  [name] buPROPion (WELLBUTRIN XL) 300 MG 24 hr tablet Take 1 tablet (300 mg total) by mouth daily. (Patient not taking: Reported on 12/03/2019)  . hydroxychloroquine (PLAQUENIL) 200 MG tablet Comments:   Filled Date: Feb 19 2019 12:00AM  Patient Notes: TK 1 T PO BID  Duration: 90  . Multiple  Vitamin (MULTIVITAMIN) capsule Take 1 capsule by mouth daily.   . nitroGLYCERIN (NITROSTAT) 0.4 MG SL tablet Place 1 tablet (0.4 mg total) under the tongue every 5 (five) minutes as needed. (Patient not taking: Reported on 12/03/2019)  . tamsulosin (FLOMAX) 0.4 MG CAPS capsule take 2 capsule by mouth daily (Patient not taking: Reported on 12/03/2019)   No facility-administered encounter medications on file as of 12/03/2019.   Current Diagnosis/Assessment:  Goals    . Pharmacy Care Plan     CARE PLAN  ENTRY  Current Barriers:  . Chronic Disease Management support, education, and care coordination needs related to diabetes, hypertension, COPD, allergies, chronic kidney disease, neuropathy  Pharmacist Clinical Goal(s):  [name] Over the next 2 weeks, patient will work with PharmD and primary care provider to address the following goals: o Diabetes: Maintain A1c goal of less than 7% with fasting blood glucose within 80-130 mg/dL and blood glucose 1-2 hours after meals less than 180 mg/dL; prevent hypoglycemia (blood glucose less than 70 mg/dL) o Hypertension: Maintain blood pressure goal of less than 140/90 mmHg; Monitor for orthostatic hypotension.  o Neuropathy/Low Back Pain: Improve pain and discontinue inappropriate medications o COPD/Allergies: Improve shortness of breath o Chronic Kidney Disease: Ensure medications are dosed appropriately for kidney function and avoid side effects o Vaccinations: Remain up to date on vaccinations. Recommend 2-dose series of shingles vaccine (Shingrix)  Interventions: . Comprehensive medication review performed . Coordinate pharmacy delivery services through [facility] . Consulted with PCP to reduce dose of duloxetine and bupropion per renal function   Patient Self Care Activities:  For the next 4 weeks until follow up visit:  . Monitor fasting blood glucose three times daily, document, and provide at next appointment . Purchase a home blood pressure monitor that goes around the upper arm. Monitor blood pressure three days per week in the morning prior to breakfast and medications, document, and provide at next appointment . Take Tylenol 500 mg on scheduled basis, 1 tablet every 4 hours for pain  . Avoid all non-steroidal anti-inflammatory drugs including ibuprofen (Advil), naproxen (Aleve), Goody's powder, Excedrin. . Try over the counter Flonase nasal spray - 2 sprays in each nostril daily to improve allergies/reduce shortness of breath . Call  pharmacist, [name] [name], with any medication refill requests   Updated goal documentation        Financial Resource Strain: Low Risk   . Difficulty of Paying Living Expenses: Not very hard    Hyperlipidemia/CAD   Lipid Panel     Component Value Date/Time   CHOL 81 04/07/2019 1201   CHOL 116 11/18/2017 1002   TRIG 103.0 04/07/2019 1201   HDL 26.80 (L) 04/07/2019 1201   HDL 30 (L) 11/18/2017 1002   CHOLHDL 3 04/07/2019 1201   VLDL 20.6 04/07/2019 1201   LDLCALC 34 04/07/2019 1201   LDLCALC 51 11/18/2017 1002   LDLDIRECT 61.0 03/15/2017 1021   LABVLDL 35 11/18/2017 1002    CBC Latest Ref Rng & Units 11/04/2019 04/07/2019 10/21/2018  WBC 4.0 - 10.5 K/uL 8.7 7.3 -  Hemoglobin 13.0 - 17.0 g/dL 11.9(L) 13.1 11.2(A)  Hematocrit 39.0 - 52.0 % 36.7(L) 39.5 -  Platelets 150 - 400 K/uL 143(L) 167.0 145(A)   LDL goal < 70 Patient has failed these meds in past: Vascepa (cost) Patient is currently controlled on the following medications:   Simvastatin 20 mg - 1 tablet daily at 6 PM (confirmed)  Aspirin 81 mg - 1 tablet daily (confirmed)  Plavix 75  mg - 1 tablet daily (confirmed)  We discussed: confirmed adherence; long-term DAPT per cardio  Plan: Continue current medications  Hypertension   CMP Latest Ref Rng & Units 11/04/2019 06/09/2019 04/07/2019  Glucose 70 - 99 mg/dL 110(H) - 85  BUN 8 - 23 mg/dL 44(H) 45(H) 50(H)  Creatinine 0.61 - 1.24 mg/dL 3.62(H) 2.76(H) 3.30(H)  Sodium 135 - 145 mmol/L 137 - 137  Potassium 3.5 - 5.1 mmol/L 4.7 - 5.3(H)  Chloride 98 - 111 mmol/L 103 - 101  CO2 22 - 32 mmol/L 22 - 29  Calcium 8.9 - 10.3 mg/dL 8.9 - 8.9  Total Protein 6.5 - 8.1 g/dL 8.4(H) - 7.2  Total Bilirubin 0.3 - 1.2 mg/dL 1.0 - 0.6  Alkaline Phos 38 - 126 U/L 94 - 80  AST 15 - 41 U/L 16 - 12  ALT 0 - 44 U/L 15 - 12   Followed by Dr. Fletcher Anon  Office blood pressures are: BP Readings from Last 3 Encounters:  11/16/19 114/70  11/04/19 (!) 170/73  11/03/19 140/84   BP goal <  140/90 mmHg Patient has failed these meds in the past: none reported Patient checks BP at home infrequently, has home monitor, wrist cuff  Patient home BP readings are ranging: none reported, but per nephrology visit on 5/11, pt has significant orthostatic hypotension   Patient is currently uncontrolled on the following medications:   Carvedilol 12.5 mg - 1 tablet BID (confirmed)  Furosemide 20 mg - 1 tablet daily at night (confirmed)  Losartan 100 mg - 1 tablet daily (confirmed)  We discussed: importance of checking home blood pressure due to orthostasis/falls  Notes: prefers to take lasix at night to avoid frequent urination during the day/appts; Wears compression stockings daily  Plan: Continue current medications; Please purchase a home BP monitor with arm cuff and check BP 3 days per week for the next 4 weeks. Continue to avoid NSAIDs and limit sodium.   Diabetes   Recent Relevant Labs: Lab Results  Component Value Date/Time   HGBA1C 5.7 (A) 10/05/2019 11:26 AM   HGBA1C 6.1 (A) 02/11/2019 08:07 AM   HGBA1C 5.9 06/11/2016 09:56 AM   HGBA1C 6.2 03/06/2016 11:11 AM   MICROALBUR <0.7 12/13/2015 08:46 AM   MICROALBUR 38.0 (H) 06/14/2015 08:01 AM    A1c < 7% Checking BG: Daily  - rotates morning, noon, night Did not bring meter or BG log  Reports last insulin dos 11/22/19 - after taking insulin had an episode of hypoglycemia ~50 mg/dL Son reports weekly nursing visits since recent hospitalization - checking BG and BP  Patient has failed these meds in past: none Patient is currently controlled on the following medications:   Insulin NPH (Novolin N) - 15 units once daily PRN BG > 250   Insulin regular (Novolin R) - 15 units once daily PRN BG > 250  Last diabetic eye exam:  Lab Results  Component Value Date/Time   HMDIABEYEEXA Retinopathy (A) 04/13/2019 12:00 AM    Last diabetic foot exam:  Lab Results  Component Value Date/Time   HMDIABFOOTEX done 07/02/2019 12:00 AM     We discussed: reports diet is very poor and planning to improve this, motivated by recent decline in kidney function; pt is taking sliding scale insulin and resulting in hypoglycemia, encouraged only taking 15 units if BG > 250   Plan: Continue current medications; Only take insulin if BG is higher than 250.   Depression   Patient has tried these meds in  past: none reported, diazepam  Patient is currently controlled on the following medications:   Duloxetine 60 mg - 1 tablet BID (confirmed all)  Bupropion 300 mg - 1 tablet daily   Buspirone 10 mg - 2 tablets BID  Sertraline 100 mg - 2 tablets daily   We discussed: concerned about upset stomach with bupropion, reports skipping doses occasionally due to this, he also mentioned buspirone is not helping and would like to increase dose, we discussed this is not recommended due to his kidney function. Patient was agreeable to reducing duloxetine and bupropion to renal function. Will reassess mood in 1 month, then may consider further dose reduction of bupropion if still concerned about stomach pain.   Renal dose recommendations:  - Caution with duloxetine in CrCl < 30; avoid use or limit to 60 mg/day  - Caution with bupropion in CrCl 15-50; limit dose to 150 mg/day  - Caution with buspirone, avoid use in severe renal impairment  Plan: Per PCP consult, recommend reducing duloxetine to 60 mg once daily and bupropion to 150 mg once daily.   CKD   Scr: 3.62, GFR 16 ml/min (11/04/19) Adjusted body weight: 82 kg CrCl: 23 ml/min (adj. bw)  30 ml/min (actual bw) He is followed by Dr. [name] Patient has failed these meds in past: none reported Patient is currently on the following medications:   Calcitriol 0.25 mcg - 1 tablet three times a week (M,W,F - confirmed)  Plan: Continue current medications  BPH   Patient has failed these meds in past: none reported Patient is currently controlled on the following medications:   Tamsulosin 0.4  mg - 1 caps daily at night (confirmed)  Finasteride 5 mg - 1 tablet daily (confirmed)  We discussed: denies current problems with urinary frequency; no renal dose adjustments recommended; per nephrology, tamsulosin reduced to 1 tablet daily on 12/01/19 due to orthostasis   Plan: Continue current medications; Continue to monitor urinary symptoms and orthostasis. F/u in 1 month.   Neuropathy/Low back pain    Patient has failed these meds in past: none Patient is currently controlled on the following medications:   Duloxetine 60 mg - 1 tablet BID   Tylenol 500 mg - 1 tablet every 4-6 hours   We discussed: pt reports he switched to extra strength Tylenol, denies NSAID use since last CCM visit   Plan: Continue current medications; Continue scheduled Tylenol 500 mg every 4-6 hours and strongly recommend avoiding NSAIDs due to renal function.   Falls   We discussed: Pt reports he has started home PT. Also has requested assistance with bathing.  Meds reviewed for fall risk: - Pt reports no longer taking diazepam or cyclobenzaprine - Recent dose decrease of tamsulosin - No further medication concerns   Plan: Continue to review medications for fall risk. Review meds for de-prescribing.  Avoid hypoglycemia and hypotension.   Vaccines   Reviewed and discussed patient's vaccination history.    Immunization History  Administered Date(s) Administered  . Fluad Quad(high Dose 65+) 04/07/2019  . Influenza Split 04/12/2011, 04/29/2012  . Influenza,inj,Quad PF,6+ Mos 05/18/2013, 03/19/2014, 03/14/2015, 05/01/2017, 03/25/2018  . Influenza-Unspecified 03/13/2016  . PFIZER SARS-COV-2 Vaccination 09/17/2019, 10/08/2019  . Pneumococcal Conjugate-13 03/15/2017  . Pneumococcal Polysaccharide-23 07/03/2012, 10/09/2013  . Tdap 08/06/2009, 04/24/2018  . Zoster 11/24/2010   Plan: Recommended patient receive Shingrix  Medication Management  Misc: nitroglycerin 0.4 mg - 1 tablet PRN,  hydroxychloroquine - not taking   OTCs: none reported, denies multivitamin   Pharmacy/Benefits:  Medicare ([employer] --> would like to switch to [facility]  Social support: daughter-in-law provides transportation, lives with step-son  Adherence: per refill history and patient explanation, skips doses of medication quite frequently for different reasons, some times he skips mental health medication because he doesn't think they are working --> pt would like to start adherence pill packaging with [facility]  Affordability: denies concerns  CCM Follow Up: 1 month - January 01, 2020 at 2:00 PM (telephone)  Verbal consent obtained for [facility] enhanced pharmacy services (medication synchronization, adherence packaging, delivery coordination). A medication sync plan was created to allow patient to get all medications delivered once every 30 to 90 days per patient preference. Patient understands they have freedom to choose pharmacy and clinical pharmacist will coordinate care between all prescribers and [facility].  Debbora Dus, PharmD Clinical Pharmacist [facility] at [facility] [phone]

## 2019-12-04 DIAGNOSIS — E1151 Type 2 diabetes mellitus with diabetic peripheral angiopathy without gangrene: Secondary | ICD-10-CM | POA: Diagnosis not present

## 2019-12-04 DIAGNOSIS — E1140 Type 2 diabetes mellitus with diabetic neuropathy, unspecified: Secondary | ICD-10-CM | POA: Diagnosis not present

## 2019-12-04 DIAGNOSIS — I10 Essential (primary) hypertension: Secondary | ICD-10-CM | POA: Diagnosis not present

## 2019-12-04 DIAGNOSIS — E1122 Type 2 diabetes mellitus with diabetic chronic kidney disease: Secondary | ICD-10-CM | POA: Diagnosis not present

## 2019-12-04 DIAGNOSIS — N184 Chronic kidney disease, stage 4 (severe): Secondary | ICD-10-CM | POA: Diagnosis not present

## 2019-12-04 DIAGNOSIS — I25110 Atherosclerotic heart disease of native coronary artery with unstable angina pectoris: Secondary | ICD-10-CM | POA: Diagnosis not present

## 2019-12-07 ENCOUNTER — Telehealth: Payer: Self-pay

## 2019-12-07 DIAGNOSIS — I10 Essential (primary) hypertension: Secondary | ICD-10-CM | POA: Diagnosis not present

## 2019-12-07 DIAGNOSIS — E1140 Type 2 diabetes mellitus with diabetic neuropathy, unspecified: Secondary | ICD-10-CM | POA: Diagnosis not present

## 2019-12-07 DIAGNOSIS — E1151 Type 2 diabetes mellitus with diabetic peripheral angiopathy without gangrene: Secondary | ICD-10-CM | POA: Diagnosis not present

## 2019-12-07 DIAGNOSIS — N184 Chronic kidney disease, stage 4 (severe): Secondary | ICD-10-CM | POA: Diagnosis not present

## 2019-12-07 DIAGNOSIS — E1122 Type 2 diabetes mellitus with diabetic chronic kidney disease: Secondary | ICD-10-CM | POA: Diagnosis not present

## 2019-12-07 DIAGNOSIS — I25110 Atherosclerotic heart disease of native coronary artery with unstable angina pectoris: Secondary | ICD-10-CM | POA: Diagnosis not present

## 2019-12-07 MED ORDER — SERTRALINE HCL 100 MG PO TABS: 200.0000 mg | ORAL_TABLET | Freq: Every day | ORAL | 3 refills | Status: DC

## 2019-12-07 NOTE — Telephone Encounter (Signed)
Requesting refill on sertraline 100 mg - 2 tablets daily, to [facility].   Thanks!  Debbora Dus, PharmD Clinical Pharmacist [facility] at [facility] [phone]

## 2019-12-07 NOTE — Telephone Encounter (Signed)
Rx sent electronically.  

## 2019-12-08 ENCOUNTER — Other Ambulatory Visit: Payer: Self-pay | Admitting: Cardiovascular Disease

## 2019-12-08 DIAGNOSIS — I10 Essential (primary) hypertension: Secondary | ICD-10-CM | POA: Diagnosis not present

## 2019-12-08 DIAGNOSIS — I25110 Atherosclerotic heart disease of native coronary artery with unstable angina pectoris: Secondary | ICD-10-CM | POA: Diagnosis not present

## 2019-12-08 DIAGNOSIS — N184 Chronic kidney disease, stage 4 (severe): Secondary | ICD-10-CM | POA: Diagnosis not present

## 2019-12-08 DIAGNOSIS — E1151 Type 2 diabetes mellitus with diabetic peripheral angiopathy without gangrene: Secondary | ICD-10-CM | POA: Diagnosis not present

## 2019-12-08 DIAGNOSIS — E1122 Type 2 diabetes mellitus with diabetic chronic kidney disease: Secondary | ICD-10-CM | POA: Diagnosis not present

## 2019-12-08 DIAGNOSIS — E1140 Type 2 diabetes mellitus with diabetic neuropathy, unspecified: Secondary | ICD-10-CM | POA: Diagnosis not present

## 2019-12-08 MED ORDER — FUROSEMIDE 20 MG PO TABS: 20.0000 mg | ORAL_TABLET | Freq: Every day | ORAL | 3 refills | Status: DC

## 2019-12-08 MED ORDER — CLOPIDOGREL BISULFATE 75 MG PO TABS: ORAL_TABLET | ORAL | 3 refills | Status: DC

## 2019-12-08 NOTE — Telephone Encounter (Signed)
°*  STAT* If patient is at the pharmacy, call can be transferred to refill team.   1. Which medications need to be refilled? (please list name of each medication and dose if known)  Furosemide (LASIX) 20 MG 1 tablet daily  Clopidogrel (PLAVIX) 75 MG 1 tablet daily  2. Which pharmacy/location (including street and city if local pharmacy) is medication to be sent to? [facility]   3. Do they need a 30 day or 90 day supply? 90 day

## 2019-12-10 ENCOUNTER — Other Ambulatory Visit: Payer: Self-pay | Admitting: Cardiovascular Disease

## 2019-12-10 DIAGNOSIS — E041 Nontoxic single thyroid nodule: Secondary | ICD-10-CM | POA: Diagnosis not present

## 2019-12-10 MED ORDER — CARVEDILOL 12.5 MG PO TABS: 12.5000 mg | ORAL_TABLET | Freq: Two times a day (BID) | ORAL | 1 refills | Status: DC

## 2019-12-10 NOTE — Telephone Encounter (Signed)
Requested Prescriptions   Signed Prescriptions Disp Refills   carvedilol (COREG) 12.5 MG tablet 180 tablet 1    Sig: Take 1 tablet (12.5 mg total) by mouth 2 (two) times daily.    Authorizing Provider: [name] A    Ordering User: Raelene Bott, Daliyah Sramek L

## 2019-12-10 NOTE — Telephone Encounter (Signed)
*  STAT* If patient is at the pharmacy, call can be transferred to refill team.   1. Which medications need to be refilled? (please list name of each medication and dose if known) carvedilol 12.5 MG  2. Which pharmacy/location (including street and city if local pharmacy) is medication to be sent to? [facility]   3. Do they need a 30 day or 90 day supply? 90 day

## 2019-12-15 DIAGNOSIS — E1122 Type 2 diabetes mellitus with diabetic chronic kidney disease: Secondary | ICD-10-CM | POA: Diagnosis not present

## 2019-12-15 DIAGNOSIS — E1140 Type 2 diabetes mellitus with diabetic neuropathy, unspecified: Secondary | ICD-10-CM | POA: Diagnosis not present

## 2019-12-15 DIAGNOSIS — N184 Chronic kidney disease, stage 4 (severe): Secondary | ICD-10-CM | POA: Diagnosis not present

## 2019-12-15 DIAGNOSIS — I10 Essential (primary) hypertension: Secondary | ICD-10-CM | POA: Diagnosis not present

## 2019-12-15 DIAGNOSIS — E1151 Type 2 diabetes mellitus with diabetic peripheral angiopathy without gangrene: Secondary | ICD-10-CM | POA: Diagnosis not present

## 2019-12-15 DIAGNOSIS — I25110 Atherosclerotic heart disease of native coronary artery with unstable angina pectoris: Secondary | ICD-10-CM | POA: Diagnosis not present

## 2019-12-16 DIAGNOSIS — N184 Chronic kidney disease, stage 4 (severe): Secondary | ICD-10-CM | POA: Diagnosis not present

## 2019-12-16 DIAGNOSIS — N2581 Secondary hyperparathyroidism of renal origin: Secondary | ICD-10-CM | POA: Diagnosis not present

## 2019-12-16 DIAGNOSIS — E1122 Type 2 diabetes mellitus with diabetic chronic kidney disease: Secondary | ICD-10-CM | POA: Diagnosis not present

## 2019-12-18 DIAGNOSIS — E1122 Type 2 diabetes mellitus with diabetic chronic kidney disease: Secondary | ICD-10-CM | POA: Diagnosis not present

## 2019-12-18 DIAGNOSIS — I25110 Atherosclerotic heart disease of native coronary artery with unstable angina pectoris: Secondary | ICD-10-CM | POA: Diagnosis not present

## 2019-12-18 DIAGNOSIS — I10 Essential (primary) hypertension: Secondary | ICD-10-CM | POA: Diagnosis not present

## 2019-12-18 DIAGNOSIS — E1151 Type 2 diabetes mellitus with diabetic peripheral angiopathy without gangrene: Secondary | ICD-10-CM | POA: Diagnosis not present

## 2019-12-18 DIAGNOSIS — E1140 Type 2 diabetes mellitus with diabetic neuropathy, unspecified: Secondary | ICD-10-CM | POA: Diagnosis not present

## 2019-12-18 DIAGNOSIS — N184 Chronic kidney disease, stage 4 (severe): Secondary | ICD-10-CM | POA: Diagnosis not present

## 2019-12-20 DIAGNOSIS — L4050 Arthropathic psoriasis, unspecified: Secondary | ICD-10-CM | POA: Diagnosis not present

## 2019-12-20 DIAGNOSIS — F419 Anxiety disorder, unspecified: Secondary | ICD-10-CM | POA: Diagnosis not present

## 2019-12-20 DIAGNOSIS — F329 Major depressive disorder, single episode, unspecified: Secondary | ICD-10-CM | POA: Diagnosis not present

## 2019-12-20 DIAGNOSIS — W19XXXD Unspecified fall, subsequent encounter: Secondary | ICD-10-CM | POA: Diagnosis not present

## 2019-12-20 DIAGNOSIS — I6529 Occlusion and stenosis of unspecified carotid artery: Secondary | ICD-10-CM | POA: Diagnosis not present

## 2019-12-20 DIAGNOSIS — R296 Repeated falls: Secondary | ICD-10-CM | POA: Diagnosis not present

## 2019-12-20 DIAGNOSIS — I350 Nonrheumatic aortic (valve) stenosis: Secondary | ICD-10-CM | POA: Diagnosis not present

## 2019-12-20 DIAGNOSIS — E1151 Type 2 diabetes mellitus with diabetic peripheral angiopathy without gangrene: Secondary | ICD-10-CM | POA: Diagnosis not present

## 2019-12-20 DIAGNOSIS — D126 Benign neoplasm of colon, unspecified: Secondary | ICD-10-CM | POA: Diagnosis not present

## 2019-12-20 DIAGNOSIS — E669 Obesity, unspecified: Secondary | ICD-10-CM | POA: Diagnosis not present

## 2019-12-20 DIAGNOSIS — D369 Benign neoplasm, unspecified site: Secondary | ICD-10-CM | POA: Diagnosis not present

## 2019-12-20 DIAGNOSIS — G4730 Sleep apnea, unspecified: Secondary | ICD-10-CM | POA: Diagnosis not present

## 2019-12-20 DIAGNOSIS — N184 Chronic kidney disease, stage 4 (severe): Secondary | ICD-10-CM | POA: Diagnosis not present

## 2019-12-20 DIAGNOSIS — I25110 Atherosclerotic heart disease of native coronary artery with unstable angina pectoris: Secondary | ICD-10-CM | POA: Diagnosis not present

## 2019-12-20 DIAGNOSIS — Z87891 Personal history of nicotine dependence: Secondary | ICD-10-CM | POA: Diagnosis not present

## 2019-12-20 DIAGNOSIS — J449 Chronic obstructive pulmonary disease, unspecified: Secondary | ICD-10-CM | POA: Diagnosis not present

## 2019-12-20 DIAGNOSIS — Z9181 History of falling: Secondary | ICD-10-CM | POA: Diagnosis not present

## 2019-12-20 DIAGNOSIS — E1140 Type 2 diabetes mellitus with diabetic neuropathy, unspecified: Secondary | ICD-10-CM | POA: Diagnosis not present

## 2019-12-20 DIAGNOSIS — M069 Rheumatoid arthritis, unspecified: Secondary | ICD-10-CM | POA: Diagnosis not present

## 2019-12-20 DIAGNOSIS — I10 Essential (primary) hypertension: Secondary | ICD-10-CM | POA: Diagnosis not present

## 2019-12-20 DIAGNOSIS — R011 Cardiac murmur, unspecified: Secondary | ICD-10-CM | POA: Diagnosis not present

## 2019-12-20 DIAGNOSIS — E1122 Type 2 diabetes mellitus with diabetic chronic kidney disease: Secondary | ICD-10-CM | POA: Diagnosis not present

## 2019-12-20 DIAGNOSIS — K5790 Diverticulosis of intestine, part unspecified, without perforation or abscess without bleeding: Secondary | ICD-10-CM | POA: Diagnosis not present

## 2019-12-20 DIAGNOSIS — Z6837 Body mass index (BMI) 37.0-37.9, adult: Secondary | ICD-10-CM | POA: Diagnosis not present

## 2019-12-20 DIAGNOSIS — E785 Hyperlipidemia, unspecified: Secondary | ICD-10-CM | POA: Diagnosis not present

## 2019-12-24 ENCOUNTER — Encounter: Payer: Self-pay | Admitting: Internal Medicine

## 2019-12-24 DIAGNOSIS — M0579 Rheumatoid arthritis with rheumatoid factor of multiple sites without organ or systems involvement: Secondary | ICD-10-CM | POA: Diagnosis not present

## 2019-12-29 DIAGNOSIS — Z79899 Other long term (current) drug therapy: Secondary | ICD-10-CM | POA: Diagnosis not present

## 2019-12-29 DIAGNOSIS — M2550 Pain in unspecified joint: Secondary | ICD-10-CM | POA: Diagnosis not present

## 2019-12-29 DIAGNOSIS — L409 Psoriasis, unspecified: Secondary | ICD-10-CM | POA: Diagnosis not present

## 2019-12-29 DIAGNOSIS — R768 Other specified abnormal immunological findings in serum: Secondary | ICD-10-CM | POA: Diagnosis not present

## 2019-12-29 DIAGNOSIS — L4050 Arthropathic psoriasis, unspecified: Secondary | ICD-10-CM | POA: Diagnosis not present

## 2019-12-29 DIAGNOSIS — E669 Obesity, unspecified: Secondary | ICD-10-CM | POA: Diagnosis not present

## 2019-12-29 DIAGNOSIS — Z6836 Body mass index (BMI) 36.0-36.9, adult: Secondary | ICD-10-CM | POA: Diagnosis not present

## 2019-12-29 DIAGNOSIS — M0579 Rheumatoid arthritis with rheumatoid factor of multiple sites without organ or systems involvement: Secondary | ICD-10-CM | POA: Diagnosis not present

## 2019-12-29 DIAGNOSIS — M150 Primary generalized (osteo)arthritis: Secondary | ICD-10-CM | POA: Diagnosis not present

## 2019-12-31 DIAGNOSIS — B351 Tinea unguium: Secondary | ICD-10-CM | POA: Diagnosis not present

## 2019-12-31 DIAGNOSIS — E1142 Type 2 diabetes mellitus with diabetic polyneuropathy: Secondary | ICD-10-CM | POA: Diagnosis not present

## 2019-12-31 DIAGNOSIS — L851 Acquired keratosis [keratoderma] palmaris et plantaris: Secondary | ICD-10-CM | POA: Diagnosis not present

## 2020-01-01 ENCOUNTER — Ambulatory Visit: Payer: Medicare Other

## 2020-01-01 ENCOUNTER — Other Ambulatory Visit: Payer: Self-pay

## 2020-01-01 DIAGNOSIS — I10 Essential (primary) hypertension: Secondary | ICD-10-CM

## 2020-01-01 DIAGNOSIS — E1151 Type 2 diabetes mellitus with diabetic peripheral angiopathy without gangrene: Secondary | ICD-10-CM

## 2020-01-01 NOTE — Chronic Care Management (AMB) (Signed)
Chronic Care Management Pharmacy  Name: [name] [name]  MRN: [id number] DOB: [birth year]  Chief Complaint/ HPI  [name] [name],  64 y.o., male presents for their Follow-Up CCM visit with the clinical pharmacist via telephone.   PCP : [name], MD  Their chronic conditions include: hypertension, hyperlipidemia, COPD, diabetes  Office Visits:  12/03/19: CCM visit - consulted with PCP to reduce bupropion and duloxetine due to renal function, monitor home BG and BP for 4 weeks   10/05/19: [name] - DM controlled, cont current meds  Consult Visit:  12/31/19: Podiatry - note unavailable  12/02/19: Nephrology - repeat labwork today, cont losartan, kidney education class, orthostasis decrease tamsulosin to once at night,   11/10/19: Neurology - back pain, neuropathy, multiple falls, numbness in lower extremities, uses donut and heating pad, MRI ordered  11/04/19 - ED, fall   11/03/19: Cardiology - long-term DAPT, cont current meds   09/02/19: Nephrology - cont current meds   Allergies  Allergen Reactions  . Contrast Media [Iodinated Diagnostic Agents] Rash and Other (See Comments)    Got very hot and red   . Glipizide Other (See Comments)    ANTIDIABETICS. Burning  . Metrizamide Other (See Comments)    Got very hot and red  . Penicillins Hives and Swelling    Has patient had a PCN reaction causing immediate rash, facial/tongue/throat swelling, SOB or lightheadedness with hypotension: yes Has patient had a PCN reaction causing severe rash involving mucus membranes or skin necrosis: no  Has patient had a PCN reaction that required hospitalization: yes Has patient had a PCN reaction occurring within the last 10 years: no If all of the above answers are "NO", then may proceed with Cephalosporin use.    Medications: Outpatient Encounter Medications as of 01/01/2020  Medication Sig  . ACCU-CHEK FASTCLIX LANCETS MISC Use to check sugar 1 time daily.  [name] albuterol (PROAIR HFA)  108 (90 Base) MCG/ACT inhaler Inhale 2 puffs into the lungs every 6 (six) hours as needed for shortness of breath.  [name] aspirin 81 MG tablet Take 81 mg by mouth daily.  [name] buPROPion (WELLBUTRIN XL) 150 MG 24 hr tablet Take 1 tablet (150 mg total) by mouth daily.  . busPIRone (BUSPAR) 10 MG tablet TAKE 2 TABLETS(20 MG) BY MOUTH TWICE DAILY  . calcitRIOL (ROCALTROL) 0.25 MCG capsule Take 0.25 mcg by mouth 3 (three) times a week.  . carvedilol (COREG) 12.5 MG tablet Take 1 tablet (12.5 mg total) by mouth 2 (two) times daily.  . clopidogrel (PLAVIX) 75 MG tablet TAKE 1 TABLET(75 MG) BY MOUTH DAILY  . CONTOUR TEST test strip TEST EVERY DAY  . Dextran 70-Hypromellose (ARTIFICIAL TEARS) 0.1-0.3 % SOLN Place 1 drop into both eyes 4 (four) times daily as needed (dry eyes).  . DULoxetine (CYMBALTA) 60 MG capsule Take 1 capsule (60 mg total) by mouth daily.  . finasteride (PROSCAR) 5 MG tablet Take 5 mg by mouth daily.  . furosemide (LASIX) 20 MG tablet Take 1 tablet (20 mg total) by mouth daily.  . hydroxychloroquine (PLAQUENIL) 200 MG tablet Comments:   Filled Date: Feb 19 2019 12:00AM  Patient Notes: TK 1 T PO BID  Duration: 90  . losartan (COZAAR) 100 MG tablet TAKE 1 TABLET(100 MG) BY MOUTH DAILY  . Multiple Vitamin (MULTIVITAMIN) capsule Take 1 capsule by mouth daily.   . nitroGLYCERIN (NITROSTAT) 0.4 MG SL tablet Place 1 tablet (0.4 mg total) under the tongue every 5 (five) minutes as  needed. (Patient not taking: Reported on 12/03/2019)  . RELION INSULIN SYRINGE 31G X 15/64" 1 ML MISC ONE INJECTION THREE TIMES DAILY  . sertraline (ZOLOFT) 100 MG tablet Take 2 tablets (200 mg total) by mouth daily.  . simvastatin (ZOCOR) 20 MG tablet TAKE 1 TABLET(20 MG) BY MOUTH DAILY AT 6 PM  . tamsulosin (FLOMAX) 0.4 MG CAPS capsule take 2 capsule by mouth daily (Patient not taking: Reported on 12/03/2019)  . tamsulosin (FLOMAX) 0.4 MG CAPS capsule Take by mouth.   No facility-administered encounter medications on  file as of 01/01/2020.   Current Diagnosis/Assessment:  Goals    . Pharmacy Care Plan     CARE PLAN ENTRY  Current Barriers:  . Chronic Disease Management support, education, and care coordination needs related to diabetes, hypertension, neuropathy  Pharmacist Clinical Goal(s):  [name] Over the next 6 weeks, patient will work with PharmD and primary care provider to address the following goals: o Diabetes: Maintain A1c goal of less than 7% with fasting blood glucose within 80-130 mg/dL and blood glucose 1-2 hours after meals less than 180 mg/dL; prevent hypoglycemia (blood glucose less than 70 mg/dL) o Hypertension: Maintain blood pressure goal of less than 140/90 mmHg; Monitor for orthostatic hypotension.  o Neuropathy/Low Back Pain: Improve pain and avoid NSAIDs due to kidney impairment   Interventions: . Comprehensive medication review performed . Coordinate pharmacy delivery services through [facility]  Patient Self Care Activities:  For the next 6 weeks until follow up visit:  . Diabetes: Monitor fasting blood glucose three times daily, document, and provide at next appointment. Call if any blood glucose readings < 70 mg/dL. [name] Hypertension Purchase a home blood pressure monitor that goes around the upper arm. Monitor blood pressure three days per week in the morning prior to breakfast and medications, document, and provide at next appointment. Call if any dizziness or low blood pressure.  . Neuropathy/Low Back Pain: Continue Tylenol 500 mg 1 tablet every 4 hours for pain. Avoid all non-steroidal anti-inflammatory drugs including ibuprofen (Advil), naproxen (Aleve), Goody's powder, Excedrin. . Starting pill packaging   Updated goal documentation        Financial Resource Strain: Low Risk   . Difficulty of Paying Living Expenses: Not very hard    Hypertension   CMP Latest Ref Rng & Units 11/04/2019 06/09/2019 04/07/2019  Glucose 70 - 99 mg/dL 110(H) - 85  BUN 8 - 23 mg/dL  44(H) 45(H) 50(H)  Creatinine 0.61 - 1.24 mg/dL 3.62(H) 2.76(H) 3.30(H)  Sodium 135 - 145 mmol/L 137 - 137  Potassium 3.5 - 5.1 mmol/L 4.7 - 5.3(H)  Chloride 98 - 111 mmol/L 103 - 101  CO2 22 - 32 mmol/L 22 - 29  Calcium 8.9 - 10.3 mg/dL 8.9 - 8.9  Total Protein 6.5 - 8.1 g/dL 8.4(H) - 7.2  Total Bilirubin 0.3 - 1.2 mg/dL 1.0 - 0.6  Alkaline Phos 38 - 126 U/L 94 - 80  AST 15 - 41 U/L 16 - 12  ALT 0 - 44 U/L 15 - 12   Followed by Dr. Fletcher Anon  Office blood pressures are: BP Readings from Last 3 Encounters:  11/16/19 114/70  11/04/19 (!) 170/73  11/03/19 140/84   BP goal < 140/90 mmHg Patient has failed these meds in the past: none reported Patient checks BP at home: still infrequent, using wrist cuff Patient home BP readings are ranging: 132/80 yesterday afternoon  Patient is currently controlled on the following medications:   Carvedilol 12.5 mg -  1 tablet BID   Furosemide 20 mg - 1 tablet daily at night   Losartan 100 mg - 1 tablet daily   We discussed: importance of checking home blood pressure due to orthostasis/falls; strongly recommend monitoring BP with arm cuff Notes: prefers to take lasix at night to avoid frequent urination during the day/appts; Wears compression stockings daily  Plan: Continue current medications; Recommend switching to home BP monitor with arm cuff. Continue to avoid NSAIDs and limit sodium.   Diabetes   Recent Relevant Labs: Lab Results  Component Value Date/Time   HGBA1C 5.7 (A) 10/05/2019 11:26 AM   HGBA1C 6.1 (A) 02/11/2019 08:07 AM   HGBA1C 5.9 06/11/2016 09:56 AM   HGBA1C 6.2 03/06/2016 11:11 AM   MICROALBUR <0.7 12/13/2015 08:46 AM   MICROALBUR 38.0 (H) 06/14/2015 08:01 AM    A1c < 7% Checking BG: Daily  - rotates morning, noon, night BG: 80-160s, highest 220s  Today - 131 around noon fasting  Denies taking any insulin since last visit on 12/03/19  Patient has failed these meds in past: none Patient is currently controlled on the  following medications:   Insulin NPH (Novolin N) - 15 units once daily PRN BG > 250   Insulin regular (Novolin R) - 15 units once daily PRN BG > 250  Last diabetic eye exam:  Lab Results  Component Value Date/Time   HMDIABEYEEXA Retinopathy (A) 04/13/2019 12:00 AM    Last diabetic foot exam:  Lab Results  Component Value Date/Time   HMDIABFOOTEX done 07/02/2019 12:00 AM    Diet: previously discussed importance of diet changes, pt reports diet is about the same except has cut back on breads We discussed: continue to only take insulin if BG > 250   Plan: Continue current medications; Only take insulin if BG is higher than 250. Call if any hypoglycemia.   Depression   Patient has tried these meds in past: diazepam  Patient is currently controlled on the following medications:   Duloxetine 60 mg - 1 tablet daily  Bupropion 150 mg - 1 tablet daily   Buspirone 10 mg - 2 tablets BID  Sertraline 100 mg - 2 tablets daily   Last CCM visit 12/03/19: Pt reported concern about upset stomach with bupropion and skipping doses occasionally due to this. He also mentioned buspirone is not helping and would like to increase dose. We discussed this is not recommended due to his kidney function. Recommended reducing duloxetine 60 mg BID to once daily and bupropion 300 mg to 150 mg daily due to renal function.  Today, 01/01/20: Pt has not decreased dose of bupropion or duloxetine as he is finishing out current supply. Pt is easily confused about medications. New rx with reduced doses are at [facility] and will start adherence packaging 01/23/20. Will reassess mood in 6 weeks (pt should be on lower doses at that time), then may consider further dose reduction of bupropion if stomach pain unresolved. Also consider whether buspirone is providing benefit at that time.   Plan: Continue current medications. Re-evaluate in 6 weeks.    BPH   Patient has failed these meds in past: none  reported Patient is currently controlled on the following medications:   Tamsulosin 0.4 mg - 1 caps daily at night   Finasteride 5 mg - 1 tablet daily   We discussed: pt denies any further orthostatic symptoms since reducing tamsulosin to 1 tablet qhs    Plan: Continue current medications; Continue to monitor urinary  symptoms and orthostasis.   Neuropathy/Low back pain    Patient has failed these meds in past: none Patient is currently controlled on the following medications:   Duloxetine 60 mg - 1 tablet daily  Tylenol 500 mg - 1 tablet every 4-6 hours   We discussed: history of chronic NSAID use/CKD  Plan: Continue current medications; Continue scheduled Tylenol 500 mg every 4-6 hours and strongly recommend avoiding NSAIDs due to renal function.   Falls   We discussed: continues home PT and has requested assistance with bathing  Meds reviewed for fall risk: - No longer taking diazepam or cyclobenzaprine - Recent dose decrease of tamsulosin - No further medication concerns   Plan: Continue to review medications for fall risk/de-prescribing each month. Avoid hypoglycemia and hypotension. Avoid alcohol.  Medication Management  Pharmacy: [facility]  Coordinated sync plan with patient and will start adherence packaging 01/23/20 with the following medications:  Duloxetine 60 mg - 1 breakfast   Calcitriol 0.25 mcg - 1 breakfast M,W,F  Bupropion 150 mg - 1 breakfast   Buspirone 10 mg - 2 breakfast, 2 bedtime   Sertraline 100 mg - 2 breakfast   Carvedilol 12.5 mg - 1 breakfast, 1 bedtime    Furosemide 20 mg - 1 bedtime   Losartan 100 mg - 1 bedtime    Clopidogrel 75 mg - 1 breakfast   Simvastatin 20 mg - 1 bedtime   Aspirin 81 mg - 1 breakfast   Tamsulosin 0.4 mg - 1 bedtime; dose reduced from 2 tabs to 1 tab qhs 12/02/19 (requested updated rx from PCP on 6/30)   Finasteride 5 mg - 1 breakfast   CCM Follow Up: 6 weeks (telephone)  Debbora Dus,  PharmD Clinical Pharmacist [facility] at [facility] [phone]

## 2020-01-04 ENCOUNTER — Telehealth: Payer: Self-pay

## 2020-01-04 MED ORDER — ACCU-CHEK FASTCLIX LANCETS MISC: 5 refills | Status: DC

## 2020-01-04 NOTE — Telephone Encounter (Signed)
Rx sent electronically.  

## 2020-01-04 NOTE — Telephone Encounter (Signed)
Pt called requesting refill on accu-chek fastclix lancets. Please send to [facility].  Thank you,  Debbora Dus, PharmD Clinical Pharmacist [facility] at [facility] [phone]

## 2020-01-05 ENCOUNTER — Telehealth: Payer: Self-pay | Admitting: *Deleted

## 2020-01-05 NOTE — Telephone Encounter (Signed)
Pt called back requesting test strips as well. Thank you!

## 2020-01-05 NOTE — Telephone Encounter (Signed)
Olin Hauser nurse with [facility] called stating that patient has yeast under his left arm and groin area. Olin Hauser wants to know if patient can get a script for Nystatin. Pharmacy/ [facility]

## 2020-01-06 MED ORDER — NYSTATIN 100000 UNIT/GM EX POWD: 1.0000 "application " | Freq: Three times a day (TID) | CUTANEOUS | 2 refills | Status: DC

## 2020-01-06 NOTE — Telephone Encounter (Signed)
Please let him know I sent the Rx

## 2020-01-06 NOTE — Telephone Encounter (Signed)
Patient notified that Rx has been sent to the pharmacy per Dr. [name]. Patient stated that the pharmacy is going to deliver it to him tomorrow.

## 2020-01-07 ENCOUNTER — Telehealth: Payer: Self-pay | Admitting: Internal Medicine

## 2020-01-07 NOTE — Telephone Encounter (Signed)
Patient called He stated that [employer] in [place] is requesting a letter that stated the patient does need a "sleep Apnea Machine"   Patient stated that his is not longer working and needed a new one

## 2020-01-07 NOTE — Telephone Encounter (Signed)
I spoke to patient and patient scheduled appointment with Dr.Letvak on 01/12/20 for face to face office visit for [facility] [employer]!

## 2020-01-07 NOTE — Telephone Encounter (Signed)
We have been trying to order a new one but are having trouble with all the information they are requesting.

## 2020-01-11 ENCOUNTER — Ambulatory Visit (INDEPENDENT_AMBULATORY_CARE_PROVIDER_SITE_OTHER): Payer: Medicare Other

## 2020-01-11 ENCOUNTER — Other Ambulatory Visit: Payer: Self-pay

## 2020-01-11 DIAGNOSIS — I6529 Occlusion and stenosis of unspecified carotid artery: Secondary | ICD-10-CM | POA: Diagnosis not present

## 2020-01-11 DIAGNOSIS — I359 Nonrheumatic aortic valve disorder, unspecified: Secondary | ICD-10-CM | POA: Diagnosis not present

## 2020-01-12 ENCOUNTER — Encounter: Payer: Self-pay | Admitting: Internal Medicine

## 2020-01-12 ENCOUNTER — Ambulatory Visit (INDEPENDENT_AMBULATORY_CARE_PROVIDER_SITE_OTHER): Payer: Medicare Other | Admitting: Internal Medicine

## 2020-01-12 DIAGNOSIS — I70222 Atherosclerosis of native arteries of extremities with rest pain, left leg: Secondary | ICD-10-CM

## 2020-01-12 DIAGNOSIS — G4733 Obstructive sleep apnea (adult) (pediatric): Secondary | ICD-10-CM

## 2020-01-12 NOTE — Assessment & Plan Note (Signed)
His symptoms go back many decades His recall of ESS from before he used the machine showed severe symptoms He has done well with the CPAP at 12----till the machine stopped working well He does use the machine nightly and is finally sleeping well again and waking refreshed since he has a new rental machine  He is awaiting approval for his new machine now

## 2020-01-12 NOTE — Progress Notes (Signed)
Subjective:    Patient ID: [name] [name], male    DOB: 1956/04/04, 64 y.o.   MRN: [id number]  HPI Here to review sleep apnea in view of need for new CPAP machine This visit occurred during the SARS-CoV-2 public health emergency.  Safety protocols were in place, including screening questions prior to the visit, additional usage of staff PPE, and extensive cleaning of exam room while observing appropriate contact time as indicated for disinfecting solutions.   His CPAP machine has been failing It died last week He hasn't slept well in a while --as it wasn't working well He has since rented a machine awaiting approval for new machine Now he is finally sleeping well again with the new (rental) machine Uses CPAP setting of 14cm----this machine ramps up (so it has been easier to tolerate)  Reviewed symptoms from before using the CPAP (goes back to 2015) Couldn't sleep at all then Wife constantly pushing him awake due to apneas Significant daytime somnolence  Reviewed ESS from his recollection before he started using the CPAP He scored 20! Reviewed sleep study from 08/19/14 Has AHI of 39 and oxygen saturations went below 90% for 33% of the study It was a split night study and CPAP of 14 eliminated almost all the events  Since using the CPAP, he is much more alert during the day  He recalls having symptoms starting back in the 1980's Boss at work actually noted his sleepiness back then  Current Outpatient Medications on File Prior to Visit  Medication Sig Dispense Refill  . Accu-Chek FastClix Lancets MISC Use to check sugar 1 time daily. 100 each 5  . albuterol (PROAIR HFA) 108 (90 Base) MCG/ACT inhaler Inhale 2 puffs into the lungs every 6 (six) hours as needed for shortness of breath. 18 g 3  . aspirin 81 MG tablet Take 81 mg by mouth daily.    [name] buPROPion (WELLBUTRIN XL) 150 MG 24 hr tablet Take 1 tablet (150 mg total) by mouth daily. 90 tablet 3  . busPIRone (BUSPAR) 10 MG tablet  TAKE 2 TABLETS(20 MG) BY MOUTH TWICE DAILY 360 tablet 3  . calcitRIOL (ROCALTROL) 0.25 MCG capsule Take 0.25 mcg by mouth 3 (three) times a week.    . carvedilol (COREG) 12.5 MG tablet Take 1 tablet (12.5 mg total) by mouth 2 (two) times daily. 180 tablet 1  . clopidogrel (PLAVIX) 75 MG tablet TAKE 1 TABLET(75 MG) BY MOUTH DAILY 90 tablet 3  . CONTOUR TEST test strip TEST EVERY DAY 100 strip 11  . Dextran 70-Hypromellose (ARTIFICIAL TEARS) 0.1-0.3 % SOLN Place 1 drop into both eyes 4 (four) times daily as needed (dry eyes).    . DULoxetine (CYMBALTA) 60 MG capsule Take 1 capsule (60 mg total) by mouth daily. 90 capsule 3  . finasteride (PROSCAR) 5 MG tablet Take 5 mg by mouth daily.    . furosemide (LASIX) 20 MG tablet Take 1 tablet (20 mg total) by mouth daily. 90 tablet 3  . hydroxychloroquine (PLAQUENIL) 200 MG tablet Comments:   Filled Date: Feb 19 2019 12:00AM  Patient Notes: TK 1 T PO BID  Duration: 90    . losartan (COZAAR) 100 MG tablet TAKE 1 TABLET(100 MG) BY MOUTH DAILY 30 tablet 2  . Multiple Vitamin (MULTIVITAMIN) capsule Take 1 capsule by mouth daily.     . nitroGLYCERIN (NITROSTAT) 0.4 MG SL tablet Place 1 tablet (0.4 mg total) under the tongue every 5 (five) minutes as needed. 25 tablet  1  . nystatin (MYCOSTATIN/NYSTOP) powder Apply 1 application topically 3 (three) times daily. Substitute other antifungal powder as needed if insurance issues 60 g 2  . RELION INSULIN SYRINGE 31G X 15/64" 1 ML MISC ONE INJECTION THREE TIMES DAILY 200 each 2  . sertraline (ZOLOFT) 100 MG tablet Take 2 tablets (200 mg total) by mouth daily. 180 tablet 3  . simvastatin (ZOCOR) 20 MG tablet TAKE 1 TABLET(20 MG) BY MOUTH DAILY AT 6 PM 90 tablet 2  . tamsulosin (FLOMAX) 0.4 MG CAPS capsule take 2 capsule by mouth daily 180 capsule 3   No current facility-administered medications on file prior to visit.    Allergies  Allergen Reactions  . Contrast Media [Iodinated Diagnostic Agents] Rash and Other  (See Comments)    Got very hot and red   . Glipizide Other (See Comments)    ANTIDIABETICS. Burning  . Metrizamide Other (See Comments)    Got very hot and red  . Penicillins Hives and Swelling    Has patient had a PCN reaction causing immediate rash, facial/tongue/throat swelling, SOB or lightheadedness with hypotension: yes Has patient had a PCN reaction causing severe rash involving mucus membranes or skin necrosis: no  Has patient had a PCN reaction that required hospitalization: yes Has patient had a PCN reaction occurring within the last 10 years: no If all of the above answers are "NO", then may proceed with Cephalosporin use.     Past Medical History:  Diagnosis Date  . Adenomatous colon polyp   . Allergy   . Angina at rest [facility])    chronic  . Anxiety   . Arthritis    RA  . Bell's palsy 2007  . Carotid artery occlusion   . Cataract    Dr. Dawna Part  . Coronary artery disease   . Depression   . Diabetes mellitus   . Diverticulosis   . Duodenitis   . DVT (deep venous thrombosis) (HCC)    in leg  . Dyspnea    DOE  . Gastropathy   . GERD (gastroesophageal reflux disease)   . Heart murmur   . Helicobacter pylori gastritis   . Hyperlipidemia   . Hypertension   . Kidney failure   . Mild aortic stenosis   . Neuropathy   . Obesity   . Peripheral vascular disease ([place])   . Post splenectomy syndrome   . Psoriasis   . Psoriatic arthritis ([place])   . Renal disorder    stage 4 kidney failure  . Sleep apnea    uses cpap  . SOB (shortness of breath)   . Stroke ([place])   . Tobacco use disorder    recently quit  . Tubular adenoma 08/2013   Dr. Hilarie Fredrickson  . Weak urinary stream     Past Surgical History:  Procedure Laterality Date  . CARDIAC CATHETERIZATION  08/2010   LAD: 80% ISR, RCA: 80% ostial, OM 80-90%  . CAROTID ENDARTERECTOMY  08/2010   left/ Dr. Kellie Simmering  . CATARACT EXTRACTION W/PHACO Left 07/09/2017   Procedure: CATARACT EXTRACTION PHACO AND INTRAOCULAR LENS  PLACEMENT (IOC);  Surgeon: Birder Robson, MD;  Location: [facility] ORS;  Service: Ophthalmology;  Laterality: Left;  [place] 00:23 AP% 14.2 CDE 3.26 Fluid pack lot # [id number]  . CATARACT EXTRACTION W/PHACO Right 09/10/2017   Procedure: CATARACT EXTRACTION PHACO AND INTRAOCULAR LENS PLACEMENT (IOC);  Surgeon: Birder Robson, MD;  Location: [facility] ORS;  Service: Ophthalmology;  Laterality: Right;  [place] 00:26.4 AP% 17.3 CDE 4.59  Fluid Pack Lot # [id number]  . COLONOSCOPY    . CORONARY ANGIOPLASTY     LAD: before CABG  . CORONARY ARTERY BYPASS GRAFT  09/06/2010   At [facility]: LIMA to LAD, left radial to RCA, sequential SVG to OM3 and 4  . heart stents  Jan 2011   leg stents 06/2009 and 03/2010  . LOWER EXTREMITY ANGIOGRAPHY Left 06/09/2019   Procedure: LOWER EXTREMITY ANGIOGRAPHY;  Surgeon: Katha Cabal, MD;  Location: [facility];  Service: Cardiovascular;  Laterality: Left;  . PTA of illiac and SFA  multiple   Dr. Ronalee Belts, s/p revision 11/2012  . SPLENECTOMY    . VASCULAR SURGERY     LEG STENTS    Family History  Problem Relation Age of Onset  . Lung cancer Father 64  . Arthritis Father   . Breast cancer Sister 30  . Alcoholism Sister   . Dementia Mother   . Alzheimer's disease Mother   . Arthritis Mother   . Alcoholism Brother   . Epilepsy Sister   . Diabetes Paternal Grandfather   . Colon polyps Paternal Grandfather 39  . Alcoholism Sister   . Alcoholism Sister   . Alcoholism Brother     Social History   Socioeconomic History  . Marital status: Widowed    Spouse name: Not on file  . Number of children: 2  . Years of education: Not on file  . Highest education level: Not on file  Occupational History  . Occupation: Chief Operating Officer at [employer]: Disabled mostly due to neuropathy  Tobacco Use  . Smoking status: Former Smoker    Packs/day: 0.75    Years: 40.00    Pack years: 30.00    Types: Cigarettes    Quit date: 04/15/2017    Years since quitting: 2.7    . Smokeless tobacco: Never Used  . Tobacco comment: 1 cigarette a day  Vaping Use  . Vaping Use: Never used  Substance and Sexual Activity  . Alcohol use: No  . Drug use: No  . Sexual activity: Never  Other Topics Concern  . Not on file  Social History Narrative   Wife died 01-30-2019      Has living will   Gearldine Shown and his wife Levada Dy should be his health care POA   Would accept resuscitation --but no prolonged ventilation   No tube feeds if cognitively unaware   Social Determinants of Health   Financial Resource Strain: Low Risk   . Difficulty of Paying Living Expenses: Not very hard  Food Insecurity: No Food Insecurity  . Worried About Charity fundraiser in the Last Year: Never true  . Ran Out of Food in the Last Year: Never true  Transportation Needs: No Transportation Needs  . Lack of Transportation (Medical): No  . Lack of Transportation (Non-Medical): No  Physical Activity:   . Days of Exercise per Week:   . Minutes of Exercise per Session:   Stress: No Stress Concern Present  . Feeling of Stress : Only a little  Social Connections: Unknown  . Frequency of Communication with Friends and Family: More than three times a week  . Frequency of Social Gatherings with Friends and Family: Not on file  . Attends Religious Services: Not on file  . Active Member of Clubs or Organizations: Not on file  . Attends Archivist Meetings: Not on file  . Marital Status: Not on file  Intimate Partner  Violence: Unknown  . Fear of Current or Ex-Partner: No  . Emotionally Abused: No  . Physically Abused: No  . Sexually Abused: Not on file   Review of Systems     Objective:   Physical Exam         Assessment & Plan:

## 2020-01-14 ENCOUNTER — Other Ambulatory Visit: Payer: Self-pay

## 2020-01-14 DIAGNOSIS — I359 Nonrheumatic aortic valve disorder, unspecified: Secondary | ICD-10-CM

## 2020-01-14 DIAGNOSIS — I6529 Occlusion and stenosis of unspecified carotid artery: Secondary | ICD-10-CM

## 2020-01-14 NOTE — Progress Notes (Signed)
Carotid

## 2020-01-15 ENCOUNTER — Telehealth: Payer: Self-pay

## 2020-01-15 NOTE — Telephone Encounter (Signed)
Pam, RN from [facility] called for verbal orders for continuation of Skilled Nursing for 1 x week for 9 weeks. Working on medication management and a rash.  Also, asked if he could get a rx for diflucan. He is using the Nystatin Powder. Send to [facility]. She is aware Dr [name] will not be back until Monday.

## 2020-01-16 MED ORDER — FLUCONAZOLE 150 MG PO TABS: 150.0000 mg | ORAL_TABLET | Freq: Once | ORAL | 1 refills | Status: AC

## 2020-01-16 NOTE — Telephone Encounter (Signed)
Okay to continue the skilled nursing visits. Rx sent

## 2020-01-18 NOTE — Telephone Encounter (Signed)
Spoke to [employer].

## 2020-01-19 ENCOUNTER — Ambulatory Visit: Payer: Medicare Other | Admitting: Internal Medicine

## 2020-01-19 DIAGNOSIS — J449 Chronic obstructive pulmonary disease, unspecified: Secondary | ICD-10-CM | POA: Diagnosis not present

## 2020-01-19 DIAGNOSIS — L4050 Arthropathic psoriasis, unspecified: Secondary | ICD-10-CM | POA: Diagnosis not present

## 2020-01-19 DIAGNOSIS — E1151 Type 2 diabetes mellitus with diabetic peripheral angiopathy without gangrene: Secondary | ICD-10-CM | POA: Diagnosis not present

## 2020-01-19 DIAGNOSIS — E1122 Type 2 diabetes mellitus with diabetic chronic kidney disease: Secondary | ICD-10-CM | POA: Diagnosis not present

## 2020-01-19 DIAGNOSIS — F419 Anxiety disorder, unspecified: Secondary | ICD-10-CM | POA: Diagnosis not present

## 2020-01-19 DIAGNOSIS — I10 Essential (primary) hypertension: Secondary | ICD-10-CM | POA: Diagnosis not present

## 2020-01-19 DIAGNOSIS — D126 Benign neoplasm of colon, unspecified: Secondary | ICD-10-CM | POA: Diagnosis not present

## 2020-01-19 DIAGNOSIS — G510 Bell's palsy: Secondary | ICD-10-CM | POA: Diagnosis not present

## 2020-01-19 DIAGNOSIS — E1140 Type 2 diabetes mellitus with diabetic neuropathy, unspecified: Secondary | ICD-10-CM | POA: Diagnosis not present

## 2020-01-19 DIAGNOSIS — R21 Rash and other nonspecific skin eruption: Secondary | ICD-10-CM | POA: Diagnosis not present

## 2020-01-19 DIAGNOSIS — R011 Cardiac murmur, unspecified: Secondary | ICD-10-CM | POA: Diagnosis not present

## 2020-01-19 DIAGNOSIS — Z6837 Body mass index (BMI) 37.0-37.9, adult: Secondary | ICD-10-CM | POA: Diagnosis not present

## 2020-01-19 DIAGNOSIS — Z4800 Encounter for change or removal of nonsurgical wound dressing: Secondary | ICD-10-CM | POA: Diagnosis not present

## 2020-01-19 DIAGNOSIS — M069 Rheumatoid arthritis, unspecified: Secondary | ICD-10-CM | POA: Diagnosis not present

## 2020-01-19 DIAGNOSIS — I350 Nonrheumatic aortic (valve) stenosis: Secondary | ICD-10-CM | POA: Diagnosis not present

## 2020-01-19 DIAGNOSIS — E1136 Type 2 diabetes mellitus with diabetic cataract: Secondary | ICD-10-CM | POA: Diagnosis not present

## 2020-01-19 DIAGNOSIS — I2510 Atherosclerotic heart disease of native coronary artery without angina pectoris: Secondary | ICD-10-CM | POA: Diagnosis not present

## 2020-01-19 DIAGNOSIS — E669 Obesity, unspecified: Secondary | ICD-10-CM | POA: Diagnosis not present

## 2020-01-19 DIAGNOSIS — N184 Chronic kidney disease, stage 4 (severe): Secondary | ICD-10-CM | POA: Diagnosis not present

## 2020-01-19 DIAGNOSIS — R296 Repeated falls: Secondary | ICD-10-CM | POA: Diagnosis not present

## 2020-01-19 DIAGNOSIS — I6529 Occlusion and stenosis of unspecified carotid artery: Secondary | ICD-10-CM | POA: Diagnosis not present

## 2020-01-19 DIAGNOSIS — F329 Major depressive disorder, single episode, unspecified: Secondary | ICD-10-CM | POA: Diagnosis not present

## 2020-01-19 DIAGNOSIS — G4730 Sleep apnea, unspecified: Secondary | ICD-10-CM | POA: Diagnosis not present

## 2020-01-19 DIAGNOSIS — K5790 Diverticulosis of intestine, part unspecified, without perforation or abscess without bleeding: Secondary | ICD-10-CM | POA: Diagnosis not present

## 2020-01-19 DIAGNOSIS — E785 Hyperlipidemia, unspecified: Secondary | ICD-10-CM | POA: Diagnosis not present

## 2020-01-20 ENCOUNTER — Telehealth: Payer: Self-pay

## 2020-01-20 DIAGNOSIS — N401 Enlarged prostate with lower urinary tract symptoms: Secondary | ICD-10-CM

## 2020-01-20 DIAGNOSIS — R3912 Poor urinary stream: Secondary | ICD-10-CM

## 2020-01-20 MED ORDER — TAMSULOSIN HCL 0.4 MG PO CAPS: ORAL_CAPSULE | ORAL | 3 refills | Status: DC

## 2020-01-20 NOTE — Telephone Encounter (Signed)
New rx sent

## 2020-01-20 NOTE — Patient Instructions (Addendum)
Dear [name] [name],  Below is a summary of the goals we discussed during our follow up appointment on January 01, 2020. Please contact me anytime with questions or concerns.   Visit Information  Goals Addressed            This Visit's Progress   . Pharmacy Care Plan       CARE PLAN ENTRY  Current Barriers:  . Chronic Disease Management support, education, and care coordination needs related to diabetes, hypertension, neuropathy  Pharmacist Clinical Goal(s):  [name] Over the next 6 weeks, patient will work with PharmD and primary care provider to address the following goals: o Diabetes: Maintain A1c goal of less than 7% with fasting blood glucose within 80-130 mg/dL and blood glucose 1-2 hours after meals less than 180 mg/dL; prevent hypoglycemia (blood glucose less than 70 mg/dL) o Hypertension: Maintain blood pressure goal of less than 140/90 mmHg; Monitor for orthostatic hypotension.  o Neuropathy/Low Back Pain: Improve pain and avoid NSAIDs due to kidney impairment   Interventions: . Comprehensive medication review performed . Coordinate pharmacy delivery services through [facility]  Patient Self Care Activities:  For the next 6 weeks until follow up visit:  . Diabetes: Monitor fasting blood glucose three times daily, document, and provide at next appointment. Call if any blood glucose readings < 70 mg/dL. [name] Hypertension Purchase a home blood pressure monitor that goes around the upper arm. Monitor blood pressure three days per week in the morning prior to breakfast and medications, document, and provide at next appointment. Call if any dizziness or low blood pressure.  . Neuropathy/Low Back Pain: Continue Tylenol 500 mg 1 tablet every 4 hours for pain. Avoid all non-steroidal anti-inflammatory drugs including ibuprofen (Advil), naproxen (Aleve), Goody's powder, Excedrin. . Starting pill packaging   Updated goal documentation      The patient verbalized understanding of  instructions provided today and agreed to receive a mailed copy of patient instruction and/or educational materials.  Telephone follow up appointment with pharmacy team member scheduled for: 02/17/20 at 1:00 PM (telephone) to review blood glucose and blood pressure log   Debbora Dus, PharmD Clinical Pharmacist [facility] at [facility] [phone]   Hypotension As your heart beats, it forces blood through your body. This force is called blood pressure. If you have hypotension, you have low blood pressure. When your blood pressure is too low, you may not get enough blood to your brain or other parts of your body. This may cause you to feel weak, light-headed, have a fast heartbeat, or even pass out (faint). Low blood pressure may be harmless, or it may cause serious problems. What are the causes?  Blood loss.  Not enough water in the body (dehydration).  Heart problems.  Hormone problems.  Pregnancy.  A very bad infection.  Not having enough of certain nutrients.  Very bad allergic reactions.  Certain medicines. What increases the risk?  Age. The risk increases as you get older.  Conditions that affect the heart or the brain and spinal cord (central nervous system).  Taking certain medicines.  Being pregnant. What are the signs or symptoms?  Feeling: ? Weak. ? Light-headed. ? Dizzy. ? Tired (fatigued).  Blurred vision.  Fast heartbeat.  Passing out, in very bad cases. How is this treated?  Changing your diet. This may involve eating more salt (sodium) or drinking more water.  Taking medicines to raise your blood pressure.  Changing how much you take (the dosage) of  some of your medicines.  Wearing compression stockings. These stockings help to prevent blood clots and reduce swelling in your legs. In some cases, you may need to go to the hospital for:  Fluid replacement. This means you will receive fluids through an IV tube.  Blood  replacement. This means you will receive donated blood through an IV tube (transfusion).  Treating an infection or heart problems, if this applies.  Monitoring. You may need to be monitored while medicines that you are taking wear off. Follow these instructions at home: Eating and drinking   Drink enough fluids to keep your pee (urine) pale yellow.  Eat a healthy diet. Follow instructions from your doctor about what you can eat or drink. A healthy diet includes: ? Fresh fruits and vegetables. ? Whole grains. ? Low-fat (lean) meats. ? Low-fat dairy products.  Eat extra salt only as told. Do not add extra salt to your diet unless your doctor tells you to.  Eat small meals often.  Avoid standing up quickly after you eat. Medicines  Take over-the-counter and prescription medicines only as told by your doctor. ? Follow instructions from your doctor about changing how much you take of your medicines, if this applies. ? Do not stop or change any of your medicines on your own. General instructions   Wear compression stockings as told by your doctor.  Get up slowly from lying down or sitting.  Avoid hot showers and a lot of heat as told by your doctor.  Return to your normal activities as told by your doctor. Ask what activities are safe for you.  Do not use any products that contain nicotine or tobacco, such as cigarettes, e-cigarettes, and chewing tobacco. If you need help quitting, ask your doctor.  Keep all follow-up visits as told by your doctor. This is important. Contact a doctor if:  You throw up (vomit).  You have watery poop (diarrhea).  You have a fever for more than 2-3 days.  You feel more thirsty than normal.  You feel weak and tired. Get help right away if:  You have chest pain.  You have a fast or uneven heartbeat.  You lose feeling (have numbness) in any part of your body.  You cannot move your arms or your legs.  You have trouble talking.  You  get sweaty or feel light-headed.  You pass out.  You have trouble breathing.  You have trouble staying awake.  You feel mixed up (confused). Summary  Hypotension is also called low blood pressure. It is when the force of blood pumping through your arteries is too weak.  Hypotension may be harmless, or it may cause serious problems.  Treatment may include changing your diet and medicines, and wearing compression stockings.  In very bad cases, you may need to go to the hospital. This information is not intended to replace advice given to you by your health care provider. Make sure you discuss any questions you have with your health care provider. Document Revised: 01/02/2018 Document Reviewed: 01/02/2018 Elsevier Patient Education  [place].

## 2020-01-20 NOTE — Telephone Encounter (Signed)
Go ahead and update the prescription

## 2020-01-20 NOTE — Telephone Encounter (Signed)
Per patient report and chart review, Dr. [name] reduced tamsulosin 0.4 mg from 2 tabs qhs to 1 tab qhs due to orthostasis on 12/02/19. If you agree with dose change, need updated prescription at pharmacy for adherence packaging. Please send to [facility], thanks!   Debbora Dus, PharmD Clinical Pharmacist [facility] at [facility] [phone]

## 2020-01-21 DIAGNOSIS — I2510 Atherosclerotic heart disease of native coronary artery without angina pectoris: Secondary | ICD-10-CM | POA: Diagnosis not present

## 2020-01-21 DIAGNOSIS — R21 Rash and other nonspecific skin eruption: Secondary | ICD-10-CM | POA: Diagnosis not present

## 2020-01-21 DIAGNOSIS — I10 Essential (primary) hypertension: Secondary | ICD-10-CM | POA: Diagnosis not present

## 2020-01-21 DIAGNOSIS — N184 Chronic kidney disease, stage 4 (severe): Secondary | ICD-10-CM | POA: Diagnosis not present

## 2020-01-21 DIAGNOSIS — E1122 Type 2 diabetes mellitus with diabetic chronic kidney disease: Secondary | ICD-10-CM | POA: Diagnosis not present

## 2020-01-21 DIAGNOSIS — E1151 Type 2 diabetes mellitus with diabetic peripheral angiopathy without gangrene: Secondary | ICD-10-CM | POA: Diagnosis not present

## 2020-01-28 ENCOUNTER — Encounter (INDEPENDENT_AMBULATORY_CARE_PROVIDER_SITE_OTHER): Payer: Medicare Other

## 2020-01-28 ENCOUNTER — Ambulatory Visit (INDEPENDENT_AMBULATORY_CARE_PROVIDER_SITE_OTHER): Payer: Medicare Other | Admitting: Nurse Practitioner

## 2020-02-02 DIAGNOSIS — R21 Rash and other nonspecific skin eruption: Secondary | ICD-10-CM | POA: Diagnosis not present

## 2020-02-02 DIAGNOSIS — E1151 Type 2 diabetes mellitus with diabetic peripheral angiopathy without gangrene: Secondary | ICD-10-CM | POA: Diagnosis not present

## 2020-02-02 DIAGNOSIS — I2510 Atherosclerotic heart disease of native coronary artery without angina pectoris: Secondary | ICD-10-CM | POA: Diagnosis not present

## 2020-02-02 DIAGNOSIS — I10 Essential (primary) hypertension: Secondary | ICD-10-CM | POA: Diagnosis not present

## 2020-02-02 DIAGNOSIS — E1122 Type 2 diabetes mellitus with diabetic chronic kidney disease: Secondary | ICD-10-CM | POA: Diagnosis not present

## 2020-02-02 DIAGNOSIS — N184 Chronic kidney disease, stage 4 (severe): Secondary | ICD-10-CM | POA: Diagnosis not present

## 2020-02-04 DIAGNOSIS — G4730 Sleep apnea, unspecified: Secondary | ICD-10-CM

## 2020-02-04 DIAGNOSIS — F419 Anxiety disorder, unspecified: Secondary | ICD-10-CM

## 2020-02-04 DIAGNOSIS — E1136 Type 2 diabetes mellitus with diabetic cataract: Secondary | ICD-10-CM

## 2020-02-04 DIAGNOSIS — M069 Rheumatoid arthritis, unspecified: Secondary | ICD-10-CM

## 2020-02-04 DIAGNOSIS — E1151 Type 2 diabetes mellitus with diabetic peripheral angiopathy without gangrene: Secondary | ICD-10-CM | POA: Diagnosis not present

## 2020-02-04 DIAGNOSIS — R011 Cardiac murmur, unspecified: Secondary | ICD-10-CM

## 2020-02-04 DIAGNOSIS — D126 Benign neoplasm of colon, unspecified: Secondary | ICD-10-CM

## 2020-02-04 DIAGNOSIS — R296 Repeated falls: Secondary | ICD-10-CM

## 2020-02-04 DIAGNOSIS — L4050 Arthropathic psoriasis, unspecified: Secondary | ICD-10-CM

## 2020-02-04 DIAGNOSIS — J449 Chronic obstructive pulmonary disease, unspecified: Secondary | ICD-10-CM | POA: Diagnosis not present

## 2020-02-04 DIAGNOSIS — I6529 Occlusion and stenosis of unspecified carotid artery: Secondary | ICD-10-CM

## 2020-02-04 DIAGNOSIS — R21 Rash and other nonspecific skin eruption: Secondary | ICD-10-CM | POA: Diagnosis not present

## 2020-02-04 DIAGNOSIS — I2510 Atherosclerotic heart disease of native coronary artery without angina pectoris: Secondary | ICD-10-CM | POA: Diagnosis not present

## 2020-02-04 DIAGNOSIS — E669 Obesity, unspecified: Secondary | ICD-10-CM

## 2020-02-04 DIAGNOSIS — K5790 Diverticulosis of intestine, part unspecified, without perforation or abscess without bleeding: Secondary | ICD-10-CM

## 2020-02-04 DIAGNOSIS — E1122 Type 2 diabetes mellitus with diabetic chronic kidney disease: Secondary | ICD-10-CM | POA: Diagnosis not present

## 2020-02-04 DIAGNOSIS — G510 Bell's palsy: Secondary | ICD-10-CM

## 2020-02-04 DIAGNOSIS — Z4800 Encounter for change or removal of nonsurgical wound dressing: Secondary | ICD-10-CM

## 2020-02-04 DIAGNOSIS — E1140 Type 2 diabetes mellitus with diabetic neuropathy, unspecified: Secondary | ICD-10-CM

## 2020-02-04 DIAGNOSIS — E785 Hyperlipidemia, unspecified: Secondary | ICD-10-CM | POA: Diagnosis not present

## 2020-02-04 DIAGNOSIS — N184 Chronic kidney disease, stage 4 (severe): Secondary | ICD-10-CM | POA: Diagnosis not present

## 2020-02-04 DIAGNOSIS — Z6837 Body mass index (BMI) 37.0-37.9, adult: Secondary | ICD-10-CM

## 2020-02-04 DIAGNOSIS — F329 Major depressive disorder, single episode, unspecified: Secondary | ICD-10-CM

## 2020-02-04 DIAGNOSIS — I350 Nonrheumatic aortic (valve) stenosis: Secondary | ICD-10-CM | POA: Diagnosis not present

## 2020-02-04 DIAGNOSIS — I10 Essential (primary) hypertension: Secondary | ICD-10-CM | POA: Diagnosis not present

## 2020-02-08 ENCOUNTER — Other Ambulatory Visit: Payer: Self-pay

## 2020-02-08 ENCOUNTER — Emergency Department
Admission: EM | Admit: 2020-02-08 | Discharge: 2020-02-08 | Disposition: A | Discharge status: Home / Self Care | Payer: Medicare Other | Attending: Emergency Medicine | Admitting: Emergency Medicine

## 2020-02-08 ENCOUNTER — Emergency Department: Payer: Medicare Other

## 2020-02-08 DIAGNOSIS — E1122 Type 2 diabetes mellitus with diabetic chronic kidney disease: Secondary | ICD-10-CM | POA: Insufficient documentation

## 2020-02-08 DIAGNOSIS — Z794 Long term (current) use of insulin: Secondary | ICD-10-CM | POA: Diagnosis not present

## 2020-02-08 DIAGNOSIS — N184 Chronic kidney disease, stage 4 (severe): Secondary | ICD-10-CM | POA: Insufficient documentation

## 2020-02-08 DIAGNOSIS — Z86718 Personal history of other venous thrombosis and embolism: Secondary | ICD-10-CM | POA: Diagnosis not present

## 2020-02-08 DIAGNOSIS — E1140 Type 2 diabetes mellitus with diabetic neuropathy, unspecified: Secondary | ICD-10-CM | POA: Insufficient documentation

## 2020-02-08 DIAGNOSIS — R109 Unspecified abdominal pain: Secondary | ICD-10-CM

## 2020-02-08 DIAGNOSIS — K7689 Other specified diseases of liver: Secondary | ICD-10-CM | POA: Diagnosis not present

## 2020-02-08 DIAGNOSIS — Z951 Presence of aortocoronary bypass graft: Secondary | ICD-10-CM | POA: Insufficient documentation

## 2020-02-08 DIAGNOSIS — N200 Calculus of kidney: Secondary | ICD-10-CM | POA: Insufficient documentation

## 2020-02-08 DIAGNOSIS — I700 Atherosclerosis of aorta: Secondary | ICD-10-CM | POA: Diagnosis not present

## 2020-02-08 DIAGNOSIS — I129 Hypertensive chronic kidney disease with stage 1 through stage 4 chronic kidney disease, or unspecified chronic kidney disease: Secondary | ICD-10-CM | POA: Diagnosis not present

## 2020-02-08 DIAGNOSIS — N281 Cyst of kidney, acquired: Secondary | ICD-10-CM | POA: Diagnosis not present

## 2020-02-08 DIAGNOSIS — J449 Chronic obstructive pulmonary disease, unspecified: Secondary | ICD-10-CM | POA: Insufficient documentation

## 2020-02-08 DIAGNOSIS — Z87891 Personal history of nicotine dependence: Secondary | ICD-10-CM | POA: Diagnosis not present

## 2020-02-08 DIAGNOSIS — I2510 Atherosclerotic heart disease of native coronary artery without angina pectoris: Secondary | ICD-10-CM | POA: Insufficient documentation

## 2020-02-08 DIAGNOSIS — Z7982 Long term (current) use of aspirin: Secondary | ICD-10-CM | POA: Insufficient documentation

## 2020-02-08 DIAGNOSIS — Z79899 Other long term (current) drug therapy: Secondary | ICD-10-CM | POA: Diagnosis not present

## 2020-02-08 LAB — HEPATIC FUNCTION PANEL
ALT: 19 U/L (ref 0–44)
AST: 16 U/L (ref 15–41)
Albumin: 3.5 g/dL (ref 3.5–5.0)
Alkaline Phosphatase: 84 U/L (ref 38–126)
Bilirubin, Direct: <0.1 mg/dL (ref 0.0–0.2)
Total Bilirubin: 0.7 mg/dL (ref 0.3–1.2)
Total Protein: 7.1 g/dL (ref 6.5–8.1)

## 2020-02-08 LAB — URINALYSIS, COMPLETE (UACMP) WITH MICROSCOPIC
Bacteria, UA: NONE SEEN
Bilirubin Urine: NEGATIVE
Glucose, UA: NEGATIVE mg/dL
Hgb urine dipstick: NEGATIVE
Ketones, ur: NEGATIVE mg/dL
Leukocytes,Ua: NEGATIVE
Nitrite: NEGATIVE
Protein, ur: 100 mg/dL — AB
Specific Gravity, Urine: 1.011 (ref 1.005–1.030)
pH: 7 (ref 5.0–8.0)

## 2020-02-08 LAB — BASIC METABOLIC PANEL
Anion gap: 6 (ref 5–15)
BUN: 39 mg/dL — ABNORMAL HIGH (ref 8–23)
CO2: 28 mmol/L (ref 22–32)
Calcium: 9.3 mg/dL (ref 8.9–10.3)
Chloride: 104 mmol/L (ref 98–111)
Creatinine, Ser: 3.17 mg/dL — ABNORMAL HIGH (ref 0.61–1.24)
GFR calc Af Amer: 22 mL/min — ABNORMAL LOW (ref >60)
GFR calc non Af Amer: 19 mL/min — ABNORMAL LOW (ref >60)
Glucose, Bld: 112 mg/dL — ABNORMAL HIGH (ref 70–99)
Potassium: 5.1 mmol/L (ref 3.5–5.1)
Sodium: 138 mmol/L (ref 135–145)

## 2020-02-08 LAB — CBC
HCT: 32.7 % — ABNORMAL LOW (ref 39.0–52.0)
Hemoglobin: 10.6 g/dL — ABNORMAL LOW (ref 13.0–17.0)
MCH: 31.6 pg (ref 26.0–34.0)
MCHC: 32.4 g/dL (ref 30.0–36.0)
MCV: 97.6 fL (ref 80.0–100.0)
Platelets: 127 10*3/uL — ABNORMAL LOW (ref 150–400)
RBC: 3.35 MIL/uL — ABNORMAL LOW (ref 4.22–5.81)
RDW: 13.8 % (ref 11.5–15.5)
WBC: 6.6 10*3/uL (ref 4.0–10.5)
nRBC: 0 % (ref 0.0–0.2)

## 2020-02-08 LAB — LIPASE, BLOOD: Lipase: 30 U/L (ref 11–51)

## 2020-02-08 MED ORDER — OXYCODONE-ACETAMINOPHEN 5-325 MG PO TABS: 1.0000 | ORAL_TABLET | ORAL | 0 refills | Status: DC | PRN

## 2020-02-08 NOTE — ED Provider Notes (Signed)
[facility] Emergency Department Provider Note  Time seen: 1:56 PM  I have reviewed the triage vital signs and the nursing notes.   HISTORY  Chief Complaint Flank Pain    HPI [name] [name] is a 64 y.o. male with a past medical history of anxiety, diabetes, gastric reflux, hypertension, hyperlipidemia, CKD stage IV, presents to the emergency department for right flank pain.  According to the patient for the past 4 days he has been experiencing pain in his right flank, somewhat worse with movement.  Patient denies any changes in his urine denies dysuria or hematuria.  No nausea vomiting or diarrhea.  No fever.   Past Medical History:  Diagnosis Date  . Adenomatous colon polyp   . Allergy   . Angina at rest [facility])    chronic  . Anxiety   . Arthritis    RA  . Bell's palsy 2007  . Carotid artery occlusion   . Cataract    Dr. Dawna Part  . Coronary artery disease   . Depression   . Diabetes mellitus   . Diverticulosis   . Duodenitis   . DVT (deep venous thrombosis) (HCC)    in leg  . Dyspnea    DOE  . Gastropathy   . GERD (gastroesophageal reflux disease)   . Heart murmur   . Helicobacter pylori gastritis   . Hyperlipidemia   . Hypertension   . Kidney failure   . Mild aortic stenosis   . Neuropathy   . Obesity   . Peripheral vascular disease ([place])   . Post splenectomy syndrome   . Psoriasis   . Psoriatic arthritis ([place])   . Renal disorder    stage 4 kidney failure  . Sleep apnea    uses cpap  . SOB (shortness of breath)   . Stroke ([place])   . Tobacco use disorder    recently quit  . Tubular adenoma 08/2013   Dr. Hilarie Fredrickson  . Weak urinary stream     Patient Active Problem List   Diagnosis Date Noted  . Fall with injury 11/16/2019  . Leg weakness, bilateral 11/16/2019  . Benign hypertensive kidney disease with chronic kidney disease 09/02/2019  . Proteinuria 09/02/2019  . Secondary hyperparathyroidism of renal origin ([place]) 09/02/2019  .  Left arm pain 02/11/2019  . Gastroesophageal reflux 03/25/2018  . Atherosclerosis of aorta ([place]) 09/04/2017  . Thoracic aortic aneurysm ([place]) 09/04/2017  . Right thyroid nodule 04/12/2017  . Thrombocytopenia ([place]) 03/16/2017  . CKD stage 4 due to type 2 diabetes mellitus ([place]) 03/15/2017  . Advance directive discussed with patient 03/15/2017  . BMI 40.0-44.9, adult ([place]) 09/12/2016  . Psoriatic arthritis ([place])   . Lung nodule < 6cm on CT 03/29/2016  . COPD with acute bronchitis ([place]) 03/29/2016  . Headache 09/14/2015  . Constipation 10/05/2014  . PVD (peripheral vascular disease) ([place]) 04/23/2014  . Memory loss 04/23/2014  . Aortic valve disorder 01/11/2014  . Preventative health care 11/23/2013  . Benign prostatic hypertrophy without urinary obstruction 03/25/2013  . Enlarged prostate without lower urinary tract symptoms (luts) 03/25/2013  . Peripheral vascular disease due to secondary diabetes mellitus ([place]) 03/25/2013  . Obesity 02/12/2013  . Coronary atherosclerosis 06/06/2012  . Nonrheumatic aortic valve stenosis   . Low back pain 12/27/2011  . MDD (major depressive disorder), recurrent episode ([place]) 05/28/2011  . Carotid artery disease ([place]) 04/17/2011  . Occlusion and stenosis of carotid artery 04/17/2011  . Obstructive sleep apnea 03/05/2011  . Psoriasis  03/05/2011  . Neuropathy ([place]) 03/05/2011  . Type 2 diabetes mellitus with diabetic peripheral angiopathy without gangrene ([place]) 03/05/2011  . Polyneuropathy 03/05/2011  . Coronary artery disease   . Hyperlipidemia, mixed   . Essential hypertension     Past Surgical History:  Procedure Laterality Date  . CARDIAC CATHETERIZATION  08/2010   LAD: 80% ISR, RCA: 80% ostial, OM 80-90%  . CAROTID ENDARTERECTOMY  08/2010   left/ Dr. Kellie Simmering  . CATARACT EXTRACTION W/PHACO Left 07/09/2017   Procedure: CATARACT EXTRACTION PHACO AND INTRAOCULAR LENS PLACEMENT (IOC);  Surgeon: Birder Robson, MD;  Location: [facility] ORS;   Service: Ophthalmology;  Laterality: Left;  [place] 00:23 AP% 14.2 CDE 3.26 Fluid pack lot # [id number]  . CATARACT EXTRACTION W/PHACO Right 09/10/2017   Procedure: CATARACT EXTRACTION PHACO AND INTRAOCULAR LENS PLACEMENT (IOC);  Surgeon: Birder Robson, MD;  Location: [facility] ORS;  Service: Ophthalmology;  Laterality: Right;  [place] 00:26.4 AP% 17.3 CDE 4.59 Fluid Pack Lot # [id number]  . COLONOSCOPY    . CORONARY ANGIOPLASTY     LAD: before CABG  . CORONARY ARTERY BYPASS GRAFT  09/06/2010   At [facility]: LIMA to LAD, left radial to RCA, sequential SVG to OM3 and 4  . heart stents  Jan 2011   leg stents 06/2009 and 03/2010  . LOWER EXTREMITY ANGIOGRAPHY Left 06/09/2019   Procedure: LOWER EXTREMITY ANGIOGRAPHY;  Surgeon: Katha Cabal, MD;  Location: [facility];  Service: Cardiovascular;  Laterality: Left;  . PTA of illiac and SFA  multiple   Dr. Ronalee Belts, s/p revision 11/2012  . SPLENECTOMY    . VASCULAR SURGERY     LEG STENTS    Prior to Admission medications   Medication Sig Start Date End Date Taking? Authorizing Provider  Accu-Chek FastClix Lancets MISC Use to check sugar 1 time daily. 01/04/20   [name], MD  albuterol (PROAIR HFA) 108 [phone] Base) MCG/ACT inhaler Inhale 2 puffs into the lungs every 6 (six) hours as needed for shortness of breath. 09/14/19   [name], MD  aspirin 81 MG tablet Take 81 mg by mouth daily.    [provider]  buPROPion (WELLBUTRIN XL) 150 MG 24 hr tablet Take 1 tablet (150 mg total) by mouth daily. 12/03/19   [name], MD  busPIRone (BUSPAR) 10 MG tablet TAKE 2 TABLETS(20 MG) BY MOUTH TWICE DAILY 04/07/19   Viviana Simpler I, MD  calcitRIOL (ROCALTROL) 0.25 MCG capsule Take 0.25 mcg by mouth 3 (three) times a week.    [provider]  carvedilol (COREG) 12.5 MG tablet Take 1 tablet (12.5 mg total) by mouth 2 (two) times daily. 12/10/19   Wellington Hampshire, MD  clopidogrel (PLAVIX) 75 MG tablet TAKE 1 TABLET(75 MG) BY  MOUTH DAILY 12/08/19   Wellington Hampshire, MD  CONTOUR TEST test strip TEST EVERY DAY 01/22/19   Philemon Kingdom, MD  cyclobenzaprine (FLEXERIL) 10 MG tablet Take 10 mg by mouth 3 (three) times daily. 12/06/19   [provider]  Dextran 70-Hypromellose (ARTIFICIAL TEARS) 0.1-0.3 % SOLN Place 1 drop into both eyes 4 (four) times daily as needed (dry eyes).    [provider]  DULoxetine (CYMBALTA) 60 MG capsule Take 1 capsule (60 mg total) by mouth daily. 12/03/19   [name], MD  finasteride (PROSCAR) 5 MG tablet Take 5 mg by mouth daily.    [provider]  furosemide (LASIX) 20 MG tablet Take 1 tablet (20 mg total)  by mouth daily. 12/08/19   Wellington Hampshire, MD  hydroxychloroquine (PLAQUENIL) 200 MG tablet Comments:   Filled Date: Feb 19 2019 12:00AM  Patient Notes: TK 1 T PO BID  Duration: 90 02/19/19   [provider]  losartan (COZAAR) 100 MG tablet TAKE 1 TABLET(100 MG) BY MOUTH DAILY 03/21/18   Wellington Hampshire, MD  Multiple Vitamin (MULTIVITAMIN) capsule Take 1 capsule by mouth daily.     [provider]  nitroGLYCERIN (NITROSTAT) 0.4 MG SL tablet Place 1 tablet (0.4 mg total) under the tongue every 5 (five) minutes as needed. 06/24/12   Wellington Hampshire, MD  nystatin (MYCOSTATIN/NYSTOP) powder Apply 1 application topically 3 (three) times daily. Substitute other antifungal powder as needed if insurance issues 01/06/20   [name], MD  RELION INSULIN SYRINGE 31G X 15/64" 1 ML MISC ONE INJECTION THREE TIMES DAILY 01/20/18   Philemon Kingdom, MD  sertraline (ZOLOFT) 100 MG tablet Take 2 tablets (200 mg total) by mouth daily. 12/07/19 12/06/20  Viviana Simpler I, MD  simvastatin (ZOCOR) 20 MG tablet TAKE 1 TABLET(20 MG) BY MOUTH DAILY AT 6 PM 11/20/19   Wellington Hampshire, MD  tamsulosin [facility]) 0.4 MG CAPS capsule Take 1 capsule by mouth daily 01/20/20   [name], MD    Allergies  Allergen Reactions  . Contrast Media [Iodinated  Diagnostic Agents] Rash and Other (See Comments)    Got very hot and red   . Glipizide Other (See Comments)    ANTIDIABETICS. Burning  . Metrizamide Other (See Comments)    Got very hot and red  . Penicillins Hives and Swelling    Has patient had a PCN reaction causing immediate rash, facial/tongue/throat swelling, SOB or lightheadedness with hypotension: yes Has patient had a PCN reaction causing severe rash involving mucus membranes or skin necrosis: no  Has patient had a PCN reaction that required hospitalization: yes Has patient had a PCN reaction occurring within the last 10 years: no If all of the above answers are "NO", then may proceed with Cephalosporin use.     Family History  Problem Relation Age of Onset  . Lung cancer Father 53  . Arthritis Father   . Breast cancer Sister 11  . Alcoholism Sister   . Dementia Mother   . Alzheimer's disease Mother   . Arthritis Mother   . Alcoholism Brother   . Epilepsy Sister   . Diabetes Paternal Grandfather   . Colon polyps Paternal Grandfather 43  . Alcoholism Sister   . Alcoholism Sister   . Alcoholism Brother     Social History Social History   Tobacco Use  . Smoking status: Former Smoker    Packs/day: 0.75    Years: 40.00    Pack years: 30.00    Types: Cigarettes    Quit date: 04/15/2017    Years since quitting: 2.8  . Smokeless tobacco: Never Used  . Tobacco comment: 1 cigarette a day  Vaping Use  . Vaping Use: Never used  Substance Use Topics  . Alcohol use: No  . Drug use: No    Review of Systems Constitutional: Negative for fever. Cardiovascular: Negative for chest pain. Respiratory: Negative for shortness of breath. Gastrointestinal: Right flank pain.  No abdominal pain.  Negative for vomiting or diarrhea. Genitourinary: Negative for urinary compaints.  No changes in output. Musculoskeletal: Negative for musculoskeletal complaints Neurological: Negative for headache All other ROS  negative  ____________________________________________   PHYSICAL EXAM:  VITAL  SIGNS: ED Triage Vitals  Enc Vitals Group     BP 02/08/20 1105 (!) 147/76     Pulse Rate 02/08/20 1105 78     Resp 02/08/20 1105 18     Temp 02/08/20 1105 98 F (36.7 C)     Temp src --      SpO2 02/08/20 1105 98 %     Weight 02/08/20 1104 217 lb (98.4 kg)     Height 02/08/20 1104 5\' 6"  (1.676 m)     Head Circumference --      Peak Flow --      Pain Score 02/08/20 1104 4     Pain Loc --      Pain Edu? --      Excl. in [place]? --    Constitutional: Alert and oriented. Well appearing and in no distress. Eyes: Normal exam ENT      Head: Normocephalic and atraumatic.      Mouth/Throat: Mucous membranes are moist. Cardiovascular: Normal rate, regular rhythm. s. Respiratory: Normal respiratory effort without tachypnea nor retractions. Breath sounds are clear  Gastrointestinal: Soft and nontender. No distention.  Mild right CVA tenderness to palpation. Musculoskeletal: Nontender with normal range of motion in all extremities.  Neurologic:  Normal speech and language. No gross focal neurologic deficits  Skin:  Skin is warm, dry and intact.  Psychiatric: Mood and affect are normal.   ____________________________________________   RADIOLOGY  CT renal scan is essentially negative for acute abnormality.  ____________________________________________   INITIAL IMPRESSION / ASSESSMENT AND PLAN / ED COURSE  Pertinent labs & imaging results that were available during my care of the patient were reviewed by me and considered in my medical decision making (see chart for details).   Patient presents to the emergency department for right flank pain.  Patient's initial work-up is largely at baseline including a creatinine of 3.17.  Have added on additional lab work to the patient's blood work including LFTs and lipase.  We will obtain a CT renal scan to further evaluate.  Patient does have a history of kidney  stones although states this feels different.  Urinalysis pending at this time.  Differential would include UTI, pyelonephritis, ureterolithiasis, other intra-abdominal pathology.  CT renal scan shows some chronic changes but no findings for the patient's acute onset of discomfort.  Highly suspect musculoskeletal discomfort given otherwise reassuring work-up.  We will discharge with short course of pain medication have the patient follow-up with his doctor.  Discussed return precautions.  [name] [name] was evaluated in Emergency Department on 02/08/2020 for the symptoms described in the history of present illness. He was evaluated in the context of the global COVID-19 pandemic, which necessitated consideration that the patient might be at risk for infection with the SARS-CoV-2 virus that causes COVID-19. Institutional protocols and algorithms that pertain to the evaluation of patients at risk for COVID-19 are in a state of rapid change based on information released by regulatory bodies including the CDC and federal and state organizations. These policies and algorithms were followed during the patient's care in the ED.  ____________________________________________   FINAL CLINICAL IMPRESSION(S) / ED DIAGNOSES  Right flank pain    Harvest Dark, MD 02/08/20 1555

## 2020-02-08 NOTE — ED Triage Notes (Signed)
Pt comes with c/o right lower flank pain. Pt states this started last Thursday night and has since gotten worse. Pt states some pain when urinating.

## 2020-02-10 DIAGNOSIS — R21 Rash and other nonspecific skin eruption: Secondary | ICD-10-CM | POA: Diagnosis not present

## 2020-02-10 DIAGNOSIS — E1122 Type 2 diabetes mellitus with diabetic chronic kidney disease: Secondary | ICD-10-CM | POA: Diagnosis not present

## 2020-02-10 DIAGNOSIS — N184 Chronic kidney disease, stage 4 (severe): Secondary | ICD-10-CM | POA: Diagnosis not present

## 2020-02-10 DIAGNOSIS — I2510 Atherosclerotic heart disease of native coronary artery without angina pectoris: Secondary | ICD-10-CM | POA: Diagnosis not present

## 2020-02-10 DIAGNOSIS — I10 Essential (primary) hypertension: Secondary | ICD-10-CM | POA: Diagnosis not present

## 2020-02-10 DIAGNOSIS — E1151 Type 2 diabetes mellitus with diabetic peripheral angiopathy without gangrene: Secondary | ICD-10-CM | POA: Diagnosis not present

## 2020-02-11 ENCOUNTER — Other Ambulatory Visit: Payer: Self-pay | Admitting: Internal Medicine

## 2020-02-12 DIAGNOSIS — R0789 Other chest pain: Secondary | ICD-10-CM | POA: Diagnosis not present

## 2020-02-12 DIAGNOSIS — R0689 Other abnormalities of breathing: Secondary | ICD-10-CM | POA: Diagnosis not present

## 2020-02-12 DIAGNOSIS — R069 Unspecified abnormalities of breathing: Secondary | ICD-10-CM | POA: Diagnosis not present

## 2020-02-12 DIAGNOSIS — R0602 Shortness of breath: Secondary | ICD-10-CM | POA: Diagnosis not present

## 2020-02-12 DIAGNOSIS — R079 Chest pain, unspecified: Secondary | ICD-10-CM | POA: Diagnosis not present

## 2020-02-13 ENCOUNTER — Emergency Department: Payer: Medicare Other

## 2020-02-13 ENCOUNTER — Observation Stay: Admit: 2020-02-13 | Payer: Medicare Other

## 2020-02-13 ENCOUNTER — Observation Stay: Payer: Medicare Other

## 2020-02-13 ENCOUNTER — Other Ambulatory Visit: Payer: Self-pay

## 2020-02-13 ENCOUNTER — Inpatient Hospital Stay
Admission: EM | Admit: 2020-02-13 | Discharge: 2020-02-18 | DRG: 280 | Disposition: A | Discharge status: Home / Self Care | Payer: Medicare Other | Attending: Internal Medicine | Admitting: Internal Medicine

## 2020-02-13 DIAGNOSIS — N2581 Secondary hyperparathyroidism of renal origin: Secondary | ICD-10-CM | POA: Diagnosis present

## 2020-02-13 DIAGNOSIS — I25110 Atherosclerotic heart disease of native coronary artery with unstable angina pectoris: Secondary | ICD-10-CM | POA: Diagnosis present

## 2020-02-13 DIAGNOSIS — D631 Anemia in chronic kidney disease: Secondary | ICD-10-CM | POA: Diagnosis present

## 2020-02-13 DIAGNOSIS — I5033 Acute on chronic diastolic (congestive) heart failure: Secondary | ICD-10-CM | POA: Diagnosis not present

## 2020-02-13 DIAGNOSIS — I959 Hypotension, unspecified: Secondary | ICD-10-CM | POA: Diagnosis not present

## 2020-02-13 DIAGNOSIS — Z951 Presence of aortocoronary bypass graft: Secondary | ICD-10-CM

## 2020-02-13 DIAGNOSIS — R011 Cardiac murmur, unspecified: Secondary | ICD-10-CM | POA: Diagnosis not present

## 2020-02-13 DIAGNOSIS — I509 Heart failure, unspecified: Secondary | ICD-10-CM

## 2020-02-13 DIAGNOSIS — Z7982 Long term (current) use of aspirin: Secondary | ICD-10-CM

## 2020-02-13 DIAGNOSIS — R079 Chest pain, unspecified: Secondary | ICD-10-CM | POA: Diagnosis not present

## 2020-02-13 DIAGNOSIS — I160 Hypertensive urgency: Secondary | ICD-10-CM | POA: Diagnosis not present

## 2020-02-13 DIAGNOSIS — D126 Benign neoplasm of colon, unspecified: Secondary | ICD-10-CM | POA: Diagnosis not present

## 2020-02-13 DIAGNOSIS — Z20822 Contact with and (suspected) exposure to covid-19: Secondary | ICD-10-CM | POA: Diagnosis present

## 2020-02-13 DIAGNOSIS — R112 Nausea with vomiting, unspecified: Secondary | ICD-10-CM | POA: Diagnosis present

## 2020-02-13 DIAGNOSIS — J9601 Acute respiratory failure with hypoxia: Secondary | ICD-10-CM | POA: Diagnosis not present

## 2020-02-13 DIAGNOSIS — R52 Pain, unspecified: Secondary | ICD-10-CM

## 2020-02-13 DIAGNOSIS — N179 Acute kidney failure, unspecified: Secondary | ICD-10-CM | POA: Diagnosis not present

## 2020-02-13 DIAGNOSIS — R0602 Shortness of breath: Secondary | ICD-10-CM

## 2020-02-13 DIAGNOSIS — I350 Nonrheumatic aortic (valve) stenosis: Secondary | ICD-10-CM | POA: Diagnosis not present

## 2020-02-13 DIAGNOSIS — I25118 Atherosclerotic heart disease of native coronary artery with other forms of angina pectoris: Secondary | ICD-10-CM | POA: Diagnosis not present

## 2020-02-13 DIAGNOSIS — I517 Cardiomegaly: Secondary | ICD-10-CM | POA: Diagnosis not present

## 2020-02-13 DIAGNOSIS — K219 Gastro-esophageal reflux disease without esophagitis: Secondary | ICD-10-CM | POA: Diagnosis present

## 2020-02-13 DIAGNOSIS — Z955 Presence of coronary angioplasty implant and graft: Secondary | ICD-10-CM | POA: Diagnosis not present

## 2020-02-13 DIAGNOSIS — I440 Atrioventricular block, first degree: Secondary | ICD-10-CM | POA: Diagnosis present

## 2020-02-13 DIAGNOSIS — R296 Repeated falls: Secondary | ICD-10-CM | POA: Diagnosis not present

## 2020-02-13 DIAGNOSIS — M069 Rheumatoid arthritis, unspecified: Secondary | ICD-10-CM | POA: Diagnosis not present

## 2020-02-13 DIAGNOSIS — J90 Pleural effusion, not elsewhere classified: Secondary | ICD-10-CM | POA: Diagnosis not present

## 2020-02-13 DIAGNOSIS — G4730 Sleep apnea, unspecified: Secondary | ICD-10-CM | POA: Diagnosis not present

## 2020-02-13 DIAGNOSIS — Z6837 Body mass index (BMI) 37.0-37.9, adult: Secondary | ICD-10-CM | POA: Diagnosis not present

## 2020-02-13 DIAGNOSIS — F329 Major depressive disorder, single episode, unspecified: Secondary | ICD-10-CM | POA: Diagnosis not present

## 2020-02-13 DIAGNOSIS — Z4800 Encounter for change or removal of nonsurgical wound dressing: Secondary | ICD-10-CM | POA: Diagnosis not present

## 2020-02-13 DIAGNOSIS — M549 Dorsalgia, unspecified: Secondary | ICD-10-CM | POA: Diagnosis present

## 2020-02-13 DIAGNOSIS — I5031 Acute diastolic (congestive) heart failure: Secondary | ICD-10-CM | POA: Diagnosis not present

## 2020-02-13 DIAGNOSIS — I10 Essential (primary) hypertension: Secondary | ICD-10-CM | POA: Diagnosis not present

## 2020-02-13 DIAGNOSIS — E1122 Type 2 diabetes mellitus with diabetic chronic kidney disease: Secondary | ICD-10-CM | POA: Diagnosis not present

## 2020-02-13 DIAGNOSIS — I6529 Occlusion and stenosis of unspecified carotid artery: Secondary | ICD-10-CM | POA: Diagnosis not present

## 2020-02-13 DIAGNOSIS — E669 Obesity, unspecified: Secondary | ICD-10-CM | POA: Diagnosis not present

## 2020-02-13 DIAGNOSIS — E8770 Fluid overload, unspecified: Secondary | ICD-10-CM | POA: Diagnosis not present

## 2020-02-13 DIAGNOSIS — Z79899 Other long term (current) drug therapy: Secondary | ICD-10-CM

## 2020-02-13 DIAGNOSIS — I214 Non-ST elevation (NSTEMI) myocardial infarction: Principal | ICD-10-CM | POA: Diagnosis present

## 2020-02-13 DIAGNOSIS — J449 Chronic obstructive pulmonary disease, unspecified: Secondary | ICD-10-CM | POA: Diagnosis not present

## 2020-02-13 DIAGNOSIS — Z86010 Personal history of colonic polyps: Secondary | ICD-10-CM

## 2020-02-13 DIAGNOSIS — R21 Rash and other nonspecific skin eruption: Secondary | ICD-10-CM | POA: Diagnosis not present

## 2020-02-13 DIAGNOSIS — K5790 Diverticulosis of intestine, part unspecified, without perforation or abscess without bleeding: Secondary | ICD-10-CM | POA: Diagnosis not present

## 2020-02-13 DIAGNOSIS — Z7902 Long term (current) use of antithrombotics/antiplatelets: Secondary | ICD-10-CM

## 2020-02-13 DIAGNOSIS — E1140 Type 2 diabetes mellitus with diabetic neuropathy, unspecified: Secondary | ICD-10-CM | POA: Diagnosis not present

## 2020-02-13 DIAGNOSIS — E1151 Type 2 diabetes mellitus with diabetic peripheral angiopathy without gangrene: Secondary | ICD-10-CM | POA: Diagnosis present

## 2020-02-13 DIAGNOSIS — E785 Hyperlipidemia, unspecified: Secondary | ICD-10-CM | POA: Diagnosis present

## 2020-02-13 DIAGNOSIS — Z538 Procedure and treatment not carried out for other reasons: Secondary | ICD-10-CM | POA: Diagnosis not present

## 2020-02-13 DIAGNOSIS — N184 Chronic kidney disease, stage 4 (severe): Secondary | ICD-10-CM | POA: Diagnosis present

## 2020-02-13 DIAGNOSIS — I110 Hypertensive heart disease with heart failure: Secondary | ICD-10-CM | POA: Diagnosis not present

## 2020-02-13 DIAGNOSIS — F419 Anxiety disorder, unspecified: Secondary | ICD-10-CM | POA: Diagnosis not present

## 2020-02-13 DIAGNOSIS — N189 Chronic kidney disease, unspecified: Secondary | ICD-10-CM | POA: Diagnosis not present

## 2020-02-13 DIAGNOSIS — Z87891 Personal history of nicotine dependence: Secondary | ICD-10-CM

## 2020-02-13 DIAGNOSIS — M533 Sacrococcygeal disorders, not elsewhere classified: Secondary | ICD-10-CM | POA: Diagnosis not present

## 2020-02-13 DIAGNOSIS — J811 Chronic pulmonary edema: Secondary | ICD-10-CM | POA: Diagnosis not present

## 2020-02-13 DIAGNOSIS — I5043 Acute on chronic combined systolic (congestive) and diastolic (congestive) heart failure: Secondary | ICD-10-CM

## 2020-02-13 DIAGNOSIS — I472 Ventricular tachycardia: Secondary | ICD-10-CM | POA: Diagnosis not present

## 2020-02-13 DIAGNOSIS — I130 Hypertensive heart and chronic kidney disease with heart failure and stage 1 through stage 4 chronic kidney disease, or unspecified chronic kidney disease: Secondary | ICD-10-CM | POA: Diagnosis present

## 2020-02-13 DIAGNOSIS — L4050 Arthropathic psoriasis, unspecified: Secondary | ICD-10-CM | POA: Diagnosis not present

## 2020-02-13 DIAGNOSIS — I272 Pulmonary hypertension, unspecified: Secondary | ICD-10-CM | POA: Diagnosis present

## 2020-02-13 DIAGNOSIS — E1136 Type 2 diabetes mellitus with diabetic cataract: Secondary | ICD-10-CM | POA: Diagnosis not present

## 2020-02-13 DIAGNOSIS — I2510 Atherosclerotic heart disease of native coronary artery without angina pectoris: Secondary | ICD-10-CM | POA: Diagnosis present

## 2020-02-13 DIAGNOSIS — Z9081 Acquired absence of spleen: Secondary | ICD-10-CM

## 2020-02-13 DIAGNOSIS — G8929 Other chronic pain: Secondary | ICD-10-CM | POA: Diagnosis not present

## 2020-02-13 DIAGNOSIS — I252 Old myocardial infarction: Secondary | ICD-10-CM

## 2020-02-13 DIAGNOSIS — G510 Bell's palsy: Secondary | ICD-10-CM | POA: Diagnosis not present

## 2020-02-13 LAB — HEPARIN LEVEL (UNFRACTIONATED)
Heparin Unfractionated: 0.1 IU/mL — ABNORMAL LOW (ref 0.30–0.70)
Heparin Unfractionated: 0.36 IU/mL (ref 0.30–0.70)

## 2020-02-13 LAB — GLUCOSE, CAPILLARY
Glucose-Capillary: 128 mg/dL — ABNORMAL HIGH (ref 70–99)
Glucose-Capillary: 155 mg/dL — ABNORMAL HIGH (ref 70–99)
Glucose-Capillary: 99 mg/dL (ref 70–99)
Glucose-Capillary: 131 mg/dL — ABNORMAL HIGH (ref 70–99)

## 2020-02-13 LAB — CBC
HCT: 35 % — ABNORMAL LOW (ref 39.0–52.0)
Hemoglobin: 11 g/dL — ABNORMAL LOW (ref 13.0–17.0)
MCH: 31.3 pg (ref 26.0–34.0)
MCHC: 31.4 g/dL (ref 30.0–36.0)
MCV: 99.7 fL (ref 80.0–100.0)
Platelets: 153 10*3/uL (ref 150–400)
RBC: 3.51 MIL/uL — ABNORMAL LOW (ref 4.22–5.81)
RDW: 14.7 % (ref 11.5–15.5)
WBC: 10.5 10*3/uL (ref 4.0–10.5)
nRBC: 0 % (ref 0.0–0.2)

## 2020-02-13 LAB — HEPATIC FUNCTION PANEL
ALT: 15 U/L (ref 0–44)
AST: 18 U/L (ref 15–41)
Albumin: 3.4 g/dL — ABNORMAL LOW (ref 3.5–5.0)
Alkaline Phosphatase: 95 U/L (ref 38–126)
Bilirubin, Direct: <0.1 mg/dL (ref 0.0–0.2)
Total Bilirubin: 0.6 mg/dL (ref 0.3–1.2)
Total Protein: 7.1 g/dL (ref 6.5–8.1)

## 2020-02-13 LAB — BASIC METABOLIC PANEL
Anion gap: 8 (ref 5–15)
BUN: 33 mg/dL — ABNORMAL HIGH (ref 8–23)
CO2: 26 mmol/L (ref 22–32)
Calcium: 8.5 mg/dL — ABNORMAL LOW (ref 8.9–10.3)
Chloride: 105 mmol/L (ref 98–111)
Creatinine, Ser: 3.22 mg/dL — ABNORMAL HIGH (ref 0.61–1.24)
GFR calc Af Amer: 22 mL/min — ABNORMAL LOW (ref >60)
GFR calc non Af Amer: 19 mL/min — ABNORMAL LOW (ref >60)
Glucose, Bld: 156 mg/dL — ABNORMAL HIGH (ref 70–99)
Potassium: 4.9 mmol/L (ref 3.5–5.1)
Sodium: 139 mmol/L (ref 135–145)

## 2020-02-13 LAB — BRAIN NATRIURETIC PEPTIDE: B Natriuretic Peptide: 713.3 pg/mL — ABNORMAL HIGH (ref 0.0–100.0)

## 2020-02-13 LAB — HEMOGLOBIN A1C
Hgb A1c MFr Bld: 5.7 % — ABNORMAL HIGH (ref 4.8–5.6)
Mean Plasma Glucose: 116.89 mg/dL

## 2020-02-13 LAB — SARS CORONAVIRUS 2 BY RT PCR (HOSPITAL ORDER, PERFORMED IN ~~LOC~~ HOSPITAL LAB): SARS Coronavirus 2: NEGATIVE

## 2020-02-13 LAB — LIPID PANEL
Cholesterol: 122 mg/dL (ref 0–200)
HDL: 33 mg/dL — ABNORMAL LOW (ref >40)
LDL Cholesterol: 56 mg/dL (ref 0–99)
Total CHOL/HDL Ratio: 3.7 RATIO
Triglycerides: 163 mg/dL — ABNORMAL HIGH (ref <150)
VLDL: 33 mg/dL (ref 0–40)

## 2020-02-13 LAB — HIV ANTIBODY (ROUTINE TESTING W REFLEX): HIV Screen 4th Generation wRfx: NONREACTIVE

## 2020-02-13 LAB — APTT: aPTT: 76 seconds — ABNORMAL HIGH (ref 24–36)

## 2020-02-13 LAB — PROTIME-INR
INR: 1 (ref 0.8–1.2)
Prothrombin Time: 12.9 seconds (ref 11.4–15.2)

## 2020-02-13 LAB — LIPASE, BLOOD: Lipase: 25 U/L (ref 11–51)

## 2020-02-13 LAB — TROPONIN I (HIGH SENSITIVITY)
Troponin I (High Sensitivity): 403 ng/L (ref <18)
Troponin I (High Sensitivity): 470 ng/L (ref <18)

## 2020-02-13 MED ORDER — HEPARIN (PORCINE) 25000 UT/250ML-% IV SOLN
1800.0000 [IU]/h | INTRAVENOUS | Status: DC
  Administered 2020-02-13: 1500 [IU]/h via INTRAVENOUS
  Administered 2020-02-13: 1200 [IU]/h via INTRAVENOUS
  Administered 2020-02-14: 1700 [IU]/h via INTRAVENOUS
  Filled 2020-02-13 (×4): qty 250

## 2020-02-13 MED ORDER — MORPHINE SULFATE (PF) 2 MG/ML IV SOLN
2.0000 mg | INTRAVENOUS | Status: DC | PRN
  Administered 2020-02-13 – 2020-02-17 (×7): 2 mg via INTRAVENOUS
  Filled 2020-02-13 (×7): qty 1

## 2020-02-13 MED ORDER — OXYCODONE-ACETAMINOPHEN 5-325 MG PO TABS
1.0000 | ORAL_TABLET | ORAL | Status: DC | PRN
  Administered 2020-02-13 – 2020-02-16 (×7): 1 via ORAL
  Filled 2020-02-13 (×8): qty 1

## 2020-02-13 MED ORDER — HEPARIN BOLUS VIA INFUSION
2700.0000 [IU] | Freq: Once | INTRAVENOUS | Status: AC
  Administered 2020-02-13: 2700 [IU] via INTRAVENOUS
  Filled 2020-02-13: qty 2700

## 2020-02-13 MED ORDER — NITROGLYCERIN 0.4 MG SL SUBL
0.4000 mg | SUBLINGUAL_TABLET | SUBLINGUAL | Status: DC | PRN
  Administered 2020-02-13 – 2020-02-15 (×5): 0.4 mg via SUBLINGUAL
  Filled 2020-02-13 (×4): qty 1

## 2020-02-13 MED ORDER — ALPRAZOLAM 0.25 MG PO TABS
0.2500 mg | ORAL_TABLET | Freq: Two times a day (BID) | ORAL | Status: DC | PRN
  Administered 2020-02-13 – 2020-02-17 (×5): 0.25 mg via ORAL
  Filled 2020-02-13 (×6): qty 1

## 2020-02-13 MED ORDER — ASPIRIN 300 MG RE SUPP: 300.0000 mg | RECTAL | Status: DC

## 2020-02-13 MED ORDER — SODIUM CHLORIDE 0.9% FLUSH
3.0000 mL | Freq: Once | INTRAVENOUS | Status: AC
  Administered 2020-02-13: 3 mL via INTRAVENOUS

## 2020-02-13 MED ORDER — NITROGLYCERIN 0.4 MG SL SUBL
0.4000 mg | SUBLINGUAL_TABLET | SUBLINGUAL | Status: DC | PRN
  Administered 2020-02-16 (×3): 0.4 mg via SUBLINGUAL
  Filled 2020-02-13: qty 1

## 2020-02-13 MED ORDER — ALBUTEROL SULFATE (2.5 MG/3ML) 0.083% IN NEBU
3.0000 mL | INHALATION_SOLUTION | Freq: Four times a day (QID) | RESPIRATORY_TRACT | Status: DC | PRN
  Administered 2020-02-15: 3 mL via RESPIRATORY_TRACT
  Filled 2020-02-13: qty 3

## 2020-02-13 MED ORDER — ACETAMINOPHEN 325 MG PO TABS: 650.0000 mg | ORAL_TABLET | ORAL | Status: DC | PRN

## 2020-02-13 MED ORDER — ONDANSETRON HCL 4 MG/2ML IJ SOLN
4.0000 mg | Freq: Four times a day (QID) | INTRAMUSCULAR | Status: DC | PRN
  Administered 2020-02-13: 4 mg via INTRAVENOUS
  Filled 2020-02-13: qty 2

## 2020-02-13 MED ORDER — FUROSEMIDE 10 MG/ML IJ SOLN
20.0000 mg | Freq: Two times a day (BID) | INTRAMUSCULAR | Status: DC
  Administered 2020-02-13 (×2): 20 mg via INTRAVENOUS
  Filled 2020-02-13: qty 2
  Filled 2020-02-13: qty 4

## 2020-02-13 MED ORDER — CARVEDILOL 12.5 MG PO TABS
12.5000 mg | ORAL_TABLET | Freq: Two times a day (BID) | ORAL | Status: DC
  Administered 2020-02-13 – 2020-02-18 (×11): 12.5 mg via ORAL
  Filled 2020-02-13 (×4): qty 1
  Filled 2020-02-13: qty 2
  Filled 2020-02-13 (×3): qty 1
  Filled 2020-02-13: qty 2
  Filled 2020-02-13 (×2): qty 1

## 2020-02-13 MED ORDER — HEPARIN BOLUS VIA INFUSION
4000.0000 [IU] | Freq: Once | INTRAVENOUS | Status: AC
  Administered 2020-02-13: 4000 [IU] via INTRAVENOUS
  Filled 2020-02-13: qty 4000

## 2020-02-13 MED ORDER — SIMVASTATIN 20 MG PO TABS
20.0000 mg | ORAL_TABLET | Freq: Every day | ORAL | Status: DC
  Administered 2020-02-13 – 2020-02-17 (×4): 20 mg via ORAL
  Filled 2020-02-13 (×4): qty 1

## 2020-02-13 MED ORDER — NITROGLYCERIN 2 % TD OINT
1.0000 [in_us] | TOPICAL_OINTMENT | Freq: Four times a day (QID) | TRANSDERMAL | Status: DC
  Administered 2020-02-13: 1 [in_us] via TOPICAL
  Filled 2020-02-13: qty 1

## 2020-02-13 MED ORDER — INSULIN ASPART 100 UNIT/ML ~~LOC~~ SOLN
0.0000 [IU] | Freq: Three times a day (TID) | SUBCUTANEOUS | Status: DC
  Administered 2020-02-13: 3 [IU] via SUBCUTANEOUS
  Administered 2020-02-13 – 2020-02-15 (×5): 2 [IU] via SUBCUTANEOUS
  Administered 2020-02-15 – 2020-02-16 (×3): 3 [IU] via SUBCUTANEOUS
  Administered 2020-02-16: 2 [IU] via SUBCUTANEOUS
  Administered 2020-02-17 – 2020-02-18 (×3): 3 [IU] via SUBCUTANEOUS
  Administered 2020-02-18: 2 [IU] via SUBCUTANEOUS
  Filled 2020-02-13 (×14): qty 1

## 2020-02-13 MED ORDER — ASPIRIN 81 MG PO CHEW
324.0000 mg | CHEWABLE_TABLET | ORAL | Status: DC
  Filled 2020-02-13: qty 4

## 2020-02-13 MED ORDER — ASPIRIN 81 MG PO CHEW
243.0000 mg | CHEWABLE_TABLET | Freq: Once | ORAL | Status: AC
  Administered 2020-02-13: 243 mg via ORAL

## 2020-02-13 MED ORDER — ISOSORBIDE MONONITRATE ER 30 MG PO TB24
30.0000 mg | ORAL_TABLET | Freq: Every day | ORAL | Status: DC
  Administered 2020-02-13: 30 mg via ORAL
  Filled 2020-02-13: qty 1

## 2020-02-13 MED ORDER — FUROSEMIDE 10 MG/ML IJ SOLN
20.0000 mg | Freq: Once | INTRAMUSCULAR | Status: AC
  Administered 2020-02-13: 20 mg via INTRAVENOUS
  Filled 2020-02-13: qty 4

## 2020-02-13 MED ORDER — ASPIRIN EC 81 MG PO TBEC: 81.0000 mg | DELAYED_RELEASE_TABLET | Freq: Every day | ORAL | Status: DC

## 2020-02-13 MED ORDER — INSULIN ASPART 100 UNIT/ML ~~LOC~~ SOLN: 0.0000 [IU] | Freq: Every day | SUBCUTANEOUS | Status: DC

## 2020-02-13 NOTE — Progress Notes (Signed)
ANTICOAGULATION CONSULT NOTE - Initial Consult  Pharmacy Consult for Heparin Indication: chest pain/ACS  Allergies  Allergen Reactions  . Contrast Media [Iodinated Diagnostic Agents] Rash and Other (See Comments)    Got very hot and red   . Glipizide Other (See Comments)    ANTIDIABETICS. Burning  . Metrizamide Other (See Comments)    Got very hot and red  . Penicillins Hives and Swelling    Has patient had a PCN reaction causing immediate rash, facial/tongue/throat swelling, SOB or lightheadedness with hypotension: yes Has patient had a PCN reaction causing severe rash involving mucus membranes or skin necrosis: no  Has patient had a PCN reaction that required hospitalization: yes Has patient had a PCN reaction occurring within the last 10 years: no If all of the above answers are "NO", then may proceed with Cephalosporin use.     Patient Measurements: Height: 5\' 7"  (170.2 cm) Weight: (!) 99.8 kg (220 lb) IBW/kg (Calculated) : 66.1 HEPARIN DW (KG): 87.8  Vital Signs: Temp: 97.7 F (36.5 C) (07/24 0030) Temp Source: Oral (07/24 0030) BP: 159/98 (07/24 0152) Pulse Rate: 95 (07/24 0152)  Labs: Recent Labs    02/13/20 0033  HGB 11.0*  HCT 35.0*  PLT 153  CREATININE 3.22*  TROPONINIHS 403*    Estimated Creatinine Clearance: 25.4 mL/min (A) (by C-G formula based on SCr of 3.22 mg/dL (H)).   Medical History: Past Medical History:  Diagnosis Date  . Adenomatous colon polyp   . Allergy   . Angina at rest [facility])    chronic  . Anxiety   . Arthritis    RA  . Bell's palsy 2007  . Carotid artery occlusion   . Cataract    Dr. Dawna Part  . Coronary artery disease   . Depression   . Diabetes mellitus   . Diverticulosis   . Duodenitis   . DVT (deep venous thrombosis) (HCC)    in leg  . Dyspnea    DOE  . Gastropathy   . GERD (gastroesophageal reflux disease)   . Heart murmur   . Helicobacter pylori gastritis   . Hyperlipidemia   . Hypertension   . Kidney  failure   . Mild aortic stenosis   . Neuropathy   . Obesity   . Peripheral vascular disease ([place])   . Post splenectomy syndrome   . Psoriasis   . Psoriatic arthritis ([place])   . Renal disorder    stage 4 kidney failure  . Sleep apnea    uses cpap  . SOB (shortness of breath)   . Stroke ([place])   . Tobacco use disorder    recently quit  . Tubular adenoma 08/2013   Dr. Hilarie Fredrickson  . Weak urinary stream     Medications:  (Not in a hospital admission)   Assessment: Heparin initiated for ACS.  No anticoagulants PTA per med list.  Baseline labs ordered.   Goal of Therapy:  Heparin level 0.3-0.7 units/ml Monitor platelets by anticoagulation protocol: Yes   Plan:  Heparin 4000 units bolus x 1 then infusion at 1200 units/hr Check HL 6 hours after starting heparin  [place] Crane, Cianna Kasparian A 02/13/2020,2:18 AM

## 2020-02-13 NOTE — ED Provider Notes (Signed)
[facility] Emergency Department Provider Note  ____________________________________________   First MD Initiated Contact with Patient 02/13/20 0150     (approximate)  I have reviewed the triage vital signs and the nursing notes.   HISTORY  Chief Complaint Chest Pain    HPI [name] [name] is a 64 y.o. male with history of extensive coronary artery disease status post CABG in 2012.  His cardiologist is Dr. Fletcher Anon.  He is on Lasix but was unaware of any prior diagnosis of CHF.  He has chronic kidney disease.  He presents for sharp and pressure-like left-sided chest pain that occasionally radiates to his left shoulder blade and is accompanied with shortness of breath.  The symptoms are gradually gotten worse after an acute onset a couple of days ago.  Exertion makes it worse and nothing in particular makes it better.  He was in the ED a few days ago prior to the onset of the chest pain and evaluated for right-sided flank pain.  The work-up was generally reassuring and he was discharged, but the next day or day after he developed the chest pain.  He denies fever/chills and sore throat.  He has had some intermittent nausea and vomiting.  He feels a little bit better now but said that his symptoms can be severe at times.  He has remained compliant with his medications including his furosemide and his Lasix.  He has not noticed a significant amount of swelling in his legs.        Past Medical History:  Diagnosis Date  . Adenomatous colon polyp   . Allergy   . Angina at rest [facility])    chronic  . Anxiety   . Arthritis    RA  . Bell's palsy 2007  . Carotid artery occlusion   . Cataract    Dr. Dawna Part  . Coronary artery disease   . Depression   . Diabetes mellitus   . Diverticulosis   . Duodenitis   . DVT (deep venous thrombosis) (HCC)    in leg  . Dyspnea    DOE  . Gastropathy   . GERD (gastroesophageal reflux disease)   . Heart murmur   .  Helicobacter pylori gastritis   . Hyperlipidemia   . Hypertension   . Kidney failure   . Mild aortic stenosis   . Neuropathy   . Obesity   . Peripheral vascular disease ([place])   . Post splenectomy syndrome   . Psoriasis   . Psoriatic arthritis ([place])   . Renal disorder    stage 4 kidney failure  . Sleep apnea    uses cpap  . SOB (shortness of breath)   . Stroke ([place])   . Tobacco use disorder    recently quit  . Tubular adenoma 08/2013   Dr. Hilarie Fredrickson  . Weak urinary stream     Patient Active Problem List   Diagnosis Date Noted  . NSTEMI (non-ST elevated myocardial infarction) ([place]) 02/13/2020  . Moderate aortic valve stenosis 02/13/2020  . Acute diastolic CHF (congestive heart failure) ([place]) 02/13/2020  . Fall with injury 11/16/2019  . Leg weakness, bilateral 11/16/2019  . Benign hypertensive kidney disease with chronic kidney disease 09/02/2019  . Proteinuria 09/02/2019  . Secondary hyperparathyroidism of renal origin ([place]) 09/02/2019  . Left arm pain 02/11/2019  . Gastroesophageal reflux 03/25/2018  . Atherosclerosis of aorta ([place]) 09/04/2017  . Thoracic aortic aneurysm ([place]) 09/04/2017  . Right thyroid nodule 04/12/2017  . Thrombocytopenia ([place])  03/16/2017  . CKD stage 4 due to type 2 diabetes mellitus ([place]) 03/15/2017  . Advance directive discussed with patient 03/15/2017  . BMI 40.0-44.9, adult ([place]) 09/12/2016  . Psoriatic arthritis ([place])   . Lung nodule < 6cm on CT 03/29/2016  . COPD with acute bronchitis ([place]) 03/29/2016  . Headache 09/14/2015  . Constipation 10/05/2014  . PVD (peripheral vascular disease) ([place]) 04/23/2014  . Memory loss 04/23/2014  . Aortic valve disorder 01/11/2014  . Preventative health care 11/23/2013  . Benign prostatic hypertrophy without urinary obstruction 03/25/2013  . Enlarged prostate without lower urinary tract symptoms (luts) 03/25/2013  . Peripheral vascular disease due to secondary diabetes mellitus ([place]) 03/25/2013  . Obesity  02/12/2013  . Coronary atherosclerosis 06/06/2012  . Nonrheumatic aortic valve stenosis   . Low back pain 12/27/2011  . MDD (major depressive disorder), recurrent episode ([place]) 05/28/2011  . Carotid artery disease ([place]) 04/17/2011  . Occlusion and stenosis of carotid artery 04/17/2011  . Obstructive sleep apnea 03/05/2011  . Psoriasis 03/05/2011  . Neuropathy ([place]) 03/05/2011  . Type 2 diabetes mellitus with diabetic peripheral angiopathy without gangrene ([place]) 03/05/2011  . Polyneuropathy 03/05/2011  . Coronary artery disease   . Hyperlipidemia, mixed   . Essential hypertension     Past Surgical History:  Procedure Laterality Date  . CARDIAC CATHETERIZATION  08/2010   LAD: 80% ISR, RCA: 80% ostial, OM 80-90%  . CAROTID ENDARTERECTOMY  08/2010   left/ Dr. Kellie Simmering  . CATARACT EXTRACTION W/PHACO Left 07/09/2017   Procedure: CATARACT EXTRACTION PHACO AND INTRAOCULAR LENS PLACEMENT (IOC);  Surgeon: Birder Robson, MD;  Location: [facility] ORS;  Service: Ophthalmology;  Laterality: Left;  [place] 00:23 AP% 14.2 CDE 3.26 Fluid pack lot # [id number]  . CATARACT EXTRACTION W/PHACO Right 09/10/2017   Procedure: CATARACT EXTRACTION PHACO AND INTRAOCULAR LENS PLACEMENT (IOC);  Surgeon: Birder Robson, MD;  Location: [facility] ORS;  Service: Ophthalmology;  Laterality: Right;  [place] 00:26.4 AP% 17.3 CDE 4.59 Fluid Pack Lot # [id number]  . COLONOSCOPY    . CORONARY ANGIOPLASTY     LAD: before CABG  . CORONARY ARTERY BYPASS GRAFT  09/06/2010   At [facility]: LIMA to LAD, left radial to RCA, sequential SVG to OM3 and 4  . heart stents  Jan 2011   leg stents 06/2009 and 03/2010  . LOWER EXTREMITY ANGIOGRAPHY Left 06/09/2019   Procedure: LOWER EXTREMITY ANGIOGRAPHY;  Surgeon: Katha Cabal, MD;  Location: [facility];  Service: Cardiovascular;  Laterality: Left;  . PTA of illiac and SFA  multiple   Dr. Ronalee Belts, s/p revision 11/2012  . SPLENECTOMY    . VASCULAR SURGERY     LEG STENTS    Prior to  Admission medications   Medication Sig Start Date End Date Taking? Authorizing Provider  albuterol (PROAIR HFA) 108 (90 Base) MCG/ACT inhaler Inhale 2 puffs into the lungs every 6 (six) hours as needed for shortness of breath. 09/14/19  Yes [name], MD  aspirin 81 MG tablet Take 81 mg by mouth daily.   Yes [provider]  buPROPion (WELLBUTRIN XL) 150 MG 24 hr tablet Take 1 tablet (150 mg total) by mouth daily. 12/03/19  Yes [name], MD  busPIRone (BUSPAR) 10 MG tablet TAKE 2 TABLETS(20 MG) BY MOUTH TWICE DAILY Patient taking differently: Take 20 mg by mouth 2 (two) times daily.  04/07/19  Yes [name], MD  calcitRIOL (ROCALTROL) 0.25 MCG capsule Take 0.25 mcg by mouth See admin instructions.  Take 1 capsule (0.43mcg) once weekly on Monday then Wednesday then Friday   Yes [provider]  carvedilol (COREG) 12.5 MG tablet Take 1 tablet (12.5 mg total) by mouth 2 (two) times daily. 12/10/19  Yes Wellington Hampshire, MD  clopidogrel (PLAVIX) 75 MG tablet TAKE 1 TABLET(75 MG) BY MOUTH DAILY Patient taking differently: Take 75 mg by mouth daily.  12/08/19  Yes Wellington Hampshire, MD  cyclobenzaprine (FLEXERIL) 10 MG tablet Take 10 mg by mouth 3 (three) times daily as needed for muscle spasms.  12/06/19  Yes [provider]  Dextran 70-Hypromellose (ARTIFICIAL TEARS) 0.1-0.3 % SOLN Place 1 drop into both eyes 4 (four) times daily as needed (dry eyes).   Yes [provider]  DULoxetine (CYMBALTA) 60 MG capsule Take 1 capsule (60 mg total) by mouth daily. 12/03/19  Yes [name], MD  finasteride (PROSCAR) 5 MG tablet Take 5 mg by mouth daily.   Yes [provider]  furosemide (LASIX) 20 MG tablet Take 1 tablet (20 mg total) by mouth daily. 12/08/19  Yes Wellington Hampshire, MD  losartan (COZAAR) 100 MG tablet TAKE 1 TABLET(100 MG) BY MOUTH DAILY Patient taking differently: Take 100 mg by mouth daily.  03/21/18  Yes Wellington Hampshire, MD    Multiple Vitamin (MULTIVITAMIN) capsule Take 1 capsule by mouth daily.    Yes [provider]  nitroGLYCERIN (NITROSTAT) 0.4 MG SL tablet Place 1 tablet (0.4 mg total) under the tongue every 5 (five) minutes as needed. 06/24/12  Yes Wellington Hampshire, MD  nystatin (MYCOSTATIN/NYSTOP) powder Apply 1 application topically 3 (three) times daily. Substitute other antifungal powder as needed if insurance issues 01/06/20  Yes [name], MD  oxyCODONE-acetaminophen (PERCOCET) 5-325 MG tablet Take 1 tablet by mouth every 4 (four) hours as needed for severe pain. 02/08/20  Yes Harvest Dark, MD  sertraline (ZOLOFT) 100 MG tablet Take 2 tablets (200 mg total) by mouth daily. 12/07/19 12/06/20 Yes [name], MD  simvastatin (ZOCOR) 20 MG tablet TAKE 1 TABLET(20 MG) BY MOUTH DAILY AT 6 PM Patient taking differently: Take 20 mg by mouth every evening.  11/20/19  Yes Wellington Hampshire, MD  tamsulosin (FLOMAX) 0.4 MG CAPS capsule Take 1 capsule by mouth daily Patient taking differently: Take 0.4 mg by mouth daily.  01/20/20  Yes [name], MD    Allergies Contrast media [iodinated diagnostic agents], Glipizide, Metrizamide, and Penicillins  Family History  Problem Relation Age of Onset  . Lung cancer Father 67  . Arthritis Father   . Breast cancer Sister 20  . Alcoholism Sister   . Dementia Mother   . Alzheimer's disease Mother   . Arthritis Mother   . Alcoholism Brother   . Epilepsy Sister   . Diabetes Paternal Grandfather   . Colon polyps Paternal Grandfather 63  . Alcoholism Sister   . Alcoholism Sister   . Alcoholism Brother     Social History Social History   Tobacco Use  . Smoking status: Former Smoker    Packs/day: 0.75    Years: 40.00    Pack years: 30.00    Types: Cigarettes    Quit date: 04/15/2017    Years since quitting: 2.8  . Smokeless tobacco: Never Used  . Tobacco comment: 1 cigarette a day  Vaping Use  . Vaping Use: Never used   Substance Use Topics  . Alcohol use: No  . Drug use: No    Review  of Systems Constitutional: No fever/chills Eyes: No visual changes. ENT: No sore throat. Cardiovascular: + Chest pain Respiratory: +shortness of breath. Gastrointestinal: No abdominal pain.  Positive for nausea and vomiting.  No diarrhea.  No constipation. Genitourinary: Negative for dysuria. Musculoskeletal: Negative for neck pain.  Negative for back pain. Integumentary: Negative for rash. Neurological: Negative for headaches, focal weakness or numbness.   ____________________________________________   PHYSICAL EXAM:  VITAL SIGNS: ED Triage Vitals  Enc Vitals Group     BP 02/13/20 0030 (!) 191/89     Pulse Rate 02/13/20 0030 103     Resp 02/13/20 0030 (!) 24     Temp 02/13/20 0030 97.7 F (36.5 C)     Temp Source 02/13/20 0030 Oral     SpO2 02/13/20 0030 100 %     Weight 02/13/20 0034 (!) 99.8 kg (220 lb)     Height 02/13/20 0034 1.702 m (5\' 7" )     Head Circumference --      Peak Flow --      Pain Score 02/13/20 0042 10     Pain Loc --      Pain Edu? --      Excl. in [place]? --     Constitutional: Alert and oriented.  Eyes: Conjunctivae are normal.  Head: Atraumatic. Nose: No congestion/rhinnorhea. Mouth/Throat: Patient is wearing a mask. Neck: No stridor.  No meningeal signs.   Cardiovascular: Normal rate, regular rhythm. Good peripheral circulation. Grossly normal heart sounds. Respiratory: Patient has increased work of breathing but clear breath sounds throughout on both sides with no wheezing and no coarse sounds in the bases. Gastrointestinal: Soft and nontender. No distention.  Musculoskeletal: 1+ pitting edema in bilateral lower extremities. No gross deformities of extremities. Neurologic:  Normal speech and language. No gross focal neurologic deficits are appreciated.  Skin:  Skin is warm, dry and intact. Psychiatric: Mood and affect are normal. Speech and behavior are  normal.  ____________________________________________   LABS (all labs ordered are listed, but only abnormal results are displayed)  Labs Reviewed  BASIC METABOLIC PANEL - Abnormal; Notable for the following components:      Result Value   Glucose, Bld 156 (*)    BUN 33 (*)    Creatinine, Ser 3.22 (*)    Calcium 8.5 (*)    GFR calc non Af Amer 19 (*)    GFR calc Af Amer 22 (*)    All other components within normal limits  CBC - Abnormal; Notable for the following components:   RBC 3.51 (*)    Hemoglobin 11.0 (*)    HCT 35.0 (*)    All other components within normal limits  HEPATIC FUNCTION PANEL - Abnormal; Notable for the following components:   Albumin 3.4 (*)    All other components within normal limits  BRAIN NATRIURETIC PEPTIDE - Abnormal; Notable for the following components:   B Natriuretic Peptide 713.3 (*)    All other components within normal limits  TROPONIN I (HIGH SENSITIVITY) - Abnormal; Notable for the following components:   Troponin I (High Sensitivity) 403 (*)    All other components within normal limits  SARS CORONAVIRUS 2 BY RT PCR (HOSPITAL ORDER, [facility])  PROTIME-INR  LIPASE, BLOOD  APTT  HIV ANTIBODY (ROUTINE TESTING W REFLEX)  HEMOGLOBIN A1C  HEPARIN LEVEL (UNFRACTIONATED)  TROPONIN I (HIGH SENSITIVITY)   ____________________________________________  EKG  ED ECG REPORT I, Hinda Kehr, the attending physician, personally viewed and interpreted this  ECG.  Date: 02/13/2020 EKG Time: 00: 33 Rate: 93 Rhythm: normal sinus rhythm QRS Axis: Left axis deviation Intervals: normal ST/T Wave abnormalities: Non-specific ST segment / T-wave changes, but no clear evidence of acute ischemia. Narrative Interpretation: no definitive evidence of acute ischemia; does not meet STEMI criteria.  ____________________________________________  RADIOLOGY I, Hinda Kehr, personally viewed and evaluated these images (plain  radiographs) as part of my medical decision making, as well as reviewing the written report by the radiologist.  ED MD interpretation: Appearance is consistent with congestive heart failure.  There are some dilated loops of bowel in the upper abdomen.  Official radiology report(s): DG Chest 2 View  Result Date: 02/13/2020 CLINICAL DATA:  Chest pain. EXAM: CHEST - 2 VIEW COMPARISON:  CT dated July 01, 2019. FINDINGS: There are coarse interstitial lung markings bilaterally with hazy bibasilar airspace opacities which appear to be new since 2015 chest x-ray and the patient's recent CT. The heart size is mildly enlarged but stable from prior study. The patient is status post prior CABG. There is no pneumothorax. There are dilated loops of small bowel in the upper abdomen. There are small bilateral pleural effusions. IMPRESSION: 1. Prominent interstitial lung markings with new small bilateral pleural effusions raises concern for developing congestive heart failure. An underlying atypical infectious process is not excluded. 2. Dilated loops of bowel in the upper abdomen consider a dedicated abdominal radiograph for further evaluation. Electronically Signed   By: Constance Holster M.D.   On: 02/13/2020 01:17    ____________________________________________   PROCEDURES   Procedure(s) performed (including Critical Care):  .Critical Care Performed by: Hinda Kehr, MD Authorized by: Hinda Kehr, MD   Critical care provider statement:    Critical care time (minutes):  30   Critical care time was exclusive of:  Separately billable procedures and treating other patients   Critical care was necessary to treat or prevent imminent or life-threatening deterioration of the following conditions:  Cardiac failure   Critical care was time spent personally by me on the following activities:  Development of treatment plan with patient or surrogate, discussions with consultants, evaluation of patient's  response to treatment, examination of patient, obtaining history from patient or surrogate, ordering and performing treatments and interventions, ordering and review of laboratory studies, ordering and review of radiographic studies, pulse oximetry, re-evaluation of patient's condition and review of old charts .1-3 Lead EKG Interpretation Performed by: Hinda Kehr, MD Authorized by: Hinda Kehr, MD     Interpretation: normal     ECG rate:  93   ECG rate assessment: normal     Rhythm: sinus rhythm     Ectopy: none     Conduction: normal       ____________________________________________   INITIAL IMPRESSION / MDM / ASSESSMENT AND PLAN / ED COURSE  As part of my medical decision making, I reviewed the following data within the [facility] notes reviewed and incorporated, Labs reviewed , EKG interpreted , Old chart reviewed, Radiograph personally reviewed , Discussed with admitting physician (Dr. Damita Dunnings) and Notes from prior ED visits   Differential diagnosis includes, but is not limited to, ACS, CHF exacerbation, AAS.  The patient is on the cardiac monitor to evaluate for evidence of arrhythmia and/or significant heart rate changes.  The patient's troponin is elevated at greater than 400.  His kidney disease is stable with a creatinine of about 3.  I reviewed his chest x-ray and he appears to be having a CHF  exacerbation.  He may be having an NSTEMI with resulting CHF, or he is having a CHF exacerbation which is leading to some demand ischemia in the setting of chronic kidney disease.  Regardless, I will treat for NSTEMI with heparin bolus plus infusion given the ongoing chest pain and I will give him a dose of furosemide 20 mg IV, but I do not want to over diurese him because he also has a history of aortic stenosis based on my review of his medical record.  He understands and agrees with the plan.  He is breathing a little bit harder than usual but he is able to  speak clearly and easily and is currently in no distress.  Although there was some dilated loops of bowel that appear to be visible on chest x-ray, he is currently not having any abdominal pain, nausea, nor vomiting, so we will hold off on additional imaging at this time.  Consulting the hospitalist team for admission.     Clinical Course as of Feb 13 355  Sat Feb 13, 2020  0223 Discussed case by phone with Dr. Damita Dunnings with the hospitalist service who will admit   [CF]    Clinical Course User Index [CF] Hinda Kehr, MD     ____________________________________________  FINAL CLINICAL IMPRESSION(S) / ED DIAGNOSES  Final diagnoses:  NSTEMI (non-ST elevated myocardial infarction) ([place])  Acute on chronic congestive heart failure, unspecified heart failure type ([place])  Chronic kidney disease, unspecified CKD stage     MEDICATIONS GIVEN DURING THIS VISIT:  Medications  sodium chloride flush (NS) 0.9 % injection 3 mL (3 mLs Intravenous Not Given 02/13/20 0143)  heparin bolus via infusion 4,000 Units (4,000 Units Intravenous Bolus from Bag 02/13/20 0252)    Followed by  heparin ADULT infusion 100 units/mL (25000 units/273mL sodium chloride 0.45%) (1,200 Units/hr Intravenous New Bag/Given 02/13/20 0253)  oxyCODONE-acetaminophen (PERCOCET/ROXICET) 5-325 MG per tablet 1 tablet (has no administration in time range)  carvedilol (COREG) tablet 12.5 mg (12.5 mg Oral Given 02/13/20 0356)  nitroGLYCERIN (NITROSTAT) SL tablet 0.4 mg (has no administration in time range)  simvastatin (ZOCOR) tablet 20 mg (has no administration in time range)  albuterol (PROVENTIL) (2.5 MG/3ML) 0.083% nebulizer solution 3 mL (has no administration in time range)  nitroGLYCERIN (NITROSTAT) SL tablet 0.4 mg (has no administration in time range)  acetaminophen (TYLENOL) tablet 650 mg (has no administration in time range)  ondansetron (ZOFRAN) injection 4 mg (has no administration in time range)  ALPRAZolam (XANAX) tablet  0.25 mg (has no administration in time range)  insulin aspart (novoLOG) injection 0-15 Units (has no administration in time range)  insulin aspart (novoLOG) injection 0-5 Units (has no administration in time range)  furosemide (LASIX) injection 20 mg (has no administration in time range)  furosemide (LASIX) injection 20 mg (20 mg Intravenous Given 02/13/20 0246)  aspirin chewable tablet 243 mg (243 mg Oral Given 02/13/20 0355)     ED Discharge Orders    None      *Please note:  DORIS MCGILVERY was evaluated in Emergency Department on 02/13/2020 for the symptoms described in the history of present illness. He was evaluated in the context of the global COVID-19 pandemic, which necessitated consideration that the patient might be at risk for infection with the SARS-CoV-2 virus that causes COVID-19. Institutional protocols and algorithms that pertain to the evaluation of patients at risk for COVID-19 are in a state of rapid change based on information released by regulatory bodies including the  CDC and federal and [employer]. These policies and algorithms were followed during the patient's care in the ED.  Some ED evaluations and interventions may be delayed as a result of limited staffing during and after the pandemic.*  Note:  This document was prepared using Dragon voice recognition software and may include unintentional dictation errors.   Hinda Kehr, MD 02/13/20 [phone]

## 2020-02-13 NOTE — H&P (Signed)
History and Physical    [name] [name] UKG:[id number] DOB: 05/04/1956 DOA: 02/13/2020  PCP: [name], MD   Patient coming from: home  I have personally briefly reviewed patient's old medical records in [place]  Chief Complaint: Chest pain and shortness of breath  HPI: [name] [name] is a 64 y.o. male with medical history significant for CAD status post CABG, CKD 4, diabetes, who presents to the emergency room with a several day history of shortness of breath now associated with left-sided chest pain that started 2 days ago.  Patient was evaluated in the emergency room about a week prior with left-sided flank pain and was treated and discharged after reassuring work-up.  His symptoms continue thus prompting the visit today.  He describes the chest pain as a tightness on the left chest radiating to the left shoulder blade and left arm of moderate intensity, intermittent but unable to say what makes it better or worse.  He denies nausea vomiting diaphoresis, lightheadedness.  He does have dyspnea on exertion, orthopnea and bilateral lower extremity edema.  Denies cough fever or chills ED Course: On arrival in the ER he was hypertensive at 191/89, RR 24 with O2 sat 100% on room air.  Pulse 103.  Blood work significant for troponin of 403 with a BNP 713 chest x-ray showed coarse interstitial lung markings with hazy bibasilar airspace opacities raising concern for developing congestive heart failure.  EKG showed normal sinus rhythm with nonspecific ST-T wave elevation.  Patient started on a heparin infusion, given IV Lasix.  Hospitalist consulted for admission.  Review of Systems: As per HPI otherwise all other systems on review of systems negative.    Past Medical History:  Diagnosis Date  . Adenomatous colon polyp   . Allergy   . Angina at rest [facility])    chronic  . Anxiety   . Arthritis    RA  . Bell's palsy 2007  . Carotid artery occlusion   . Cataract    Dr. Dawna Part  .  Coronary artery disease   . Depression   . Diabetes mellitus   . Diverticulosis   . Duodenitis   . DVT (deep venous thrombosis) (HCC)    in leg  . Dyspnea    DOE  . Gastropathy   . GERD (gastroesophageal reflux disease)   . Heart murmur   . Helicobacter pylori gastritis   . Hyperlipidemia   . Hypertension   . Kidney failure   . Mild aortic stenosis   . Neuropathy   . Obesity   . Peripheral vascular disease ([place])   . Post splenectomy syndrome   . Psoriasis   . Psoriatic arthritis ([place])   . Renal disorder    stage 4 kidney failure  . Sleep apnea    uses cpap  . SOB (shortness of breath)   . Stroke ([place])   . Tobacco use disorder    recently quit  . Tubular adenoma 08/2013   Dr. Hilarie Fredrickson  . Weak urinary stream     Past Surgical History:  Procedure Laterality Date  . CARDIAC CATHETERIZATION  08/2010   LAD: 80% ISR, RCA: 80% ostial, OM 80-90%  . CAROTID ENDARTERECTOMY  08/2010   left/ Dr. Kellie Simmering  . CATARACT EXTRACTION W/PHACO Left 07/09/2017   Procedure: CATARACT EXTRACTION PHACO AND INTRAOCULAR LENS PLACEMENT (IOC);  Surgeon: Birder Robson, MD;  Location: [facility] ORS;  Service: Ophthalmology;  Laterality: Left;  [place] 00:23 AP% 14.2 CDE 3.26 Fluid pack lot #  [id number]  . CATARACT EXTRACTION W/PHACO Right 09/10/2017   Procedure: CATARACT EXTRACTION PHACO AND INTRAOCULAR LENS PLACEMENT (IOC);  Surgeon: Birder Robson, MD;  Location: [facility] ORS;  Service: Ophthalmology;  Laterality: Right;  [place] 00:26.4 AP% 17.3 CDE 4.59 Fluid Pack Lot # [id number]  . COLONOSCOPY    . CORONARY ANGIOPLASTY     LAD: before CABG  . CORONARY ARTERY BYPASS GRAFT  09/06/2010   At [facility]: LIMA to LAD, left radial to RCA, sequential SVG to OM3 and 4  . heart stents  Jan 2011   leg stents 06/2009 and 03/2010  . LOWER EXTREMITY ANGIOGRAPHY Left 06/09/2019   Procedure: LOWER EXTREMITY ANGIOGRAPHY;  Surgeon: Katha Cabal, MD;  Location: [place] [facility];  Service: Cardiovascular;  Laterality:  Left;  . PTA of illiac and SFA  multiple   Dr. Ronalee Belts, s/p revision 11/2012  . SPLENECTOMY    . VASCULAR SURGERY     LEG STENTS     reports that he quit smoking about 2 years ago. His smoking use included cigarettes. He has a 30.00 pack-year smoking history. He has never used smokeless tobacco. He reports that he does not drink alcohol and does not use drugs.  Allergies  Allergen Reactions  . Contrast Media [Iodinated Diagnostic Agents] Rash and Other (See Comments)    Got very hot and red   . Glipizide Other (See Comments)    ANTIDIABETICS. Burning  . Metrizamide Other (See Comments)    Got very hot and red  . Penicillins Hives and Swelling    Has patient had a PCN reaction causing immediate rash, facial/tongue/throat swelling, SOB or lightheadedness with hypotension: yes Has patient had a PCN reaction causing severe rash involving mucus membranes or skin necrosis: no  Has patient had a PCN reaction that required hospitalization: yes Has patient had a PCN reaction occurring within the last 10 years: no If all of the above answers are "NO", then may proceed with Cephalosporin use.     Family History  Problem Relation Age of Onset  . Lung cancer Father 66  . Arthritis Father   . Breast cancer Sister 66  . Alcoholism Sister   . Dementia Mother   . Alzheimer's disease Mother   . Arthritis Mother   . Alcoholism Brother   . Epilepsy Sister   . Diabetes Paternal Grandfather   . Colon polyps Paternal Grandfather 33  . Alcoholism Sister   . Alcoholism Sister   . Alcoholism Brother       Prior to Admission medications   Medication Sig Start Date End Date Taking? Authorizing Provider  albuterol (PROAIR HFA) 108 (90 Base) MCG/ACT inhaler Inhale 2 puffs into the lungs every 6 (six) hours as needed for shortness of breath. 09/14/19  Yes [name], MD  aspirin 81 MG tablet Take 81 mg by mouth daily.   Yes [provider]  buPROPion (WELLBUTRIN XL) 150 MG 24 hr  tablet Take 1 tablet (150 mg total) by mouth daily. 12/03/19  Yes [name], MD  busPIRone (BUSPAR) 10 MG tablet TAKE 2 TABLETS(20 MG) BY MOUTH TWICE DAILY Patient taking differently: Take 20 mg by mouth 2 (two) times daily.  04/07/19  Yes [name], MD  calcitRIOL (ROCALTROL) 0.25 MCG capsule Take 0.25 mcg by mouth See admin instructions. Take 1 capsule (0.72mcg) once weekly on Monday then Wednesday then Friday   Yes [provider]  carvedilol (COREG) 12.5 MG tablet Take 1 tablet (12.5 mg total)  by mouth 2 (two) times daily. 12/10/19  Yes Wellington Hampshire, MD  clopidogrel (PLAVIX) 75 MG tablet TAKE 1 TABLET(75 MG) BY MOUTH DAILY Patient taking differently: Take 75 mg by mouth daily.  12/08/19  Yes Wellington Hampshire, MD  cyclobenzaprine (FLEXERIL) 10 MG tablet Take 10 mg by mouth 3 (three) times daily as needed for muscle spasms.  12/06/19  Yes [provider]  Dextran 70-Hypromellose (ARTIFICIAL TEARS) 0.1-0.3 % SOLN Place 1 drop into both eyes 4 (four) times daily as needed (dry eyes).   Yes [provider]  DULoxetine (CYMBALTA) 60 MG capsule Take 1 capsule (60 mg total) by mouth daily. 12/03/19  Yes [name], MD  finasteride (PROSCAR) 5 MG tablet Take 5 mg by mouth daily.   Yes [provider]  furosemide (LASIX) 20 MG tablet Take 1 tablet (20 mg total) by mouth daily. 12/08/19  Yes Wellington Hampshire, MD  losartan (COZAAR) 100 MG tablet TAKE 1 TABLET(100 MG) BY MOUTH DAILY Patient taking differently: Take 100 mg by mouth daily.  03/21/18  Yes Wellington Hampshire, MD  Multiple Vitamin (MULTIVITAMIN) capsule Take 1 capsule by mouth daily.    Yes [provider]  nitroGLYCERIN (NITROSTAT) 0.4 MG SL tablet Place 1 tablet (0.4 mg total) under the tongue every 5 (five) minutes as needed. 06/24/12  Yes Wellington Hampshire, MD  nystatin (MYCOSTATIN/NYSTOP) powder Apply 1 application topically 3 (three) times daily. Substitute other antifungal  powder as needed if insurance issues 01/06/20  Yes [name], MD  oxyCODONE-acetaminophen (PERCOCET) 5-325 MG tablet Take 1 tablet by mouth every 4 (four) hours as needed for severe pain. 02/08/20  Yes Harvest Dark, MD  sertraline (ZOLOFT) 100 MG tablet Take 2 tablets (200 mg total) by mouth daily. 12/07/19 12/06/20 Yes [name], MD  simvastatin (ZOCOR) 20 MG tablet TAKE 1 TABLET(20 MG) BY MOUTH DAILY AT 6 PM Patient taking differently: Take 20 mg by mouth every evening.  11/20/19  Yes Wellington Hampshire, MD  tamsulosin (FLOMAX) 0.4 MG CAPS capsule Take 1 capsule by mouth daily Patient taking differently: Take 0.4 mg by mouth daily.  01/20/20  Yes [name], MD    Physical Exam: Vitals:   02/13/20 0030 02/13/20 0034 02/13/20 0152  BP: (!) 191/89  (!) 159/98  Pulse: 103  95  Resp: (!) 24  (!) 26  Temp: 97.7 F (36.5 C)    TempSrc: Oral    SpO2: 100%  100%  Weight:  (!) 99.8 kg   Height:  5\' 7"  (1.702 m)      Vitals:   02/13/20 0030 02/13/20 0034 02/13/20 0152  BP: (!) 191/89  (!) 159/98  Pulse: 103  95  Resp: (!) 24  (!) 26  Temp: 97.7 F (36.5 C)    TempSrc: Oral    SpO2: 100%  100%  Weight:  (!) 99.8 kg   Height:  5\' 7"  (1.702 m)       Constitutional: Alert and oriented x 3 . Not in any apparent distress HEENT:      Head: Normocephalic and atraumatic.         Eyes: PERLA, EOMI, Conjunctivae are normal. Sclera is non-icteric.       Mouth/Throat: Mucous membranes are moist.       Neck: Supple with no signs of meningismus. Cardiovascular: Regular rate and rhythm. No murmurs, gallops, or rubs. 2+ symmetrical distal pulses are present . No JVD. No LE edema Respiratory:  Respiratory effort normal .Lungs sounds clear bilaterally. No wheezes, crackles, or rhonchi.  Gastrointestinal: Soft, non tender, and non distended with positive bowel sounds. No rebound or guarding. Genitourinary: No CVA tenderness. Musculoskeletal: Nontender with normal range of  motion in all extremities. No cyanosis, or erythema of extremities. Neurologic: Normal speech and language. Face is symmetric. Moving all extremities. No gross focal neurologic deficits . Skin: Skin is warm, dry.  No rash or ulcers Psychiatric: Mood and affect are normal Speech and behavior are normal   Labs on Admission: I have personally reviewed following labs and imaging studies  CBC: Recent Labs  Lab 02/08/20 1107 02/13/20 0033  WBC 6.6 10.5  HGB 10.6* 11.0*  HCT 32.7* 35.0*  MCV 97.6 99.7  PLT 127* 741   Basic Metabolic Panel: Recent Labs  Lab 02/08/20 1107 02/13/20 0033  NA 138 139  K 5.1 4.9  CL 104 105  CO2 28 26  GLUCOSE 112* 156*  BUN 39* 33*  CREATININE 3.17* 3.22*  CALCIUM 9.3 8.5*   GFR: Estimated Creatinine Clearance: 25.4 mL/min (A) (by C-G formula based on SCr of 3.22 mg/dL (H)). Liver Function Tests: Recent Labs  Lab 02/08/20 1107 02/13/20 0033  AST 16 18  ALT 19 15  ALKPHOS 84 95  BILITOT 0.7 0.6  PROT 7.1 7.1  ALBUMIN 3.5 3.4*   Recent Labs  Lab 02/08/20 1107 02/13/20 0033  LIPASE 30 25   No results for input(s): AMMONIA in the last 168 hours. Coagulation Profile: Recent Labs  Lab 02/13/20 0033  INR 1.0   Cardiac Enzymes: No results for input(s): CKTOTAL, CKMB, CKMBINDEX, TROPONINI in the last 168 hours. BNP (last 3 results) No results for input(s): PROBNP in the last 8760 hours. HbA1C: No results for input(s): HGBA1C in the last 72 hours. CBG: No results for input(s): GLUCAP in the last 168 hours. Lipid Profile: No results for input(s): CHOL, HDL, LDLCALC, TRIG, CHOLHDL, LDLDIRECT in the last 72 hours. Thyroid Function Tests: No results for input(s): TSH, T4TOTAL, FREET4, T3FREE, THYROIDAB in the last 72 hours. Anemia Panel: No results for input(s): VITAMINB12, FOLATE, FERRITIN, TIBC, IRON, RETICCTPCT in the last 72 hours. Urine analysis:    Component Value Date/Time   COLORURINE YELLOW (A) 02/08/2020 1106    APPEARANCEUR CLEAR (A) 02/08/2020 1106   APPEARANCEUR Clear 06/13/2015 1012   LABSPEC 1.011 02/08/2020 1106   PHURINE 7.0 02/08/2020 1106   GLUCOSEU NEGATIVE 02/08/2020 1106   HGBUR NEGATIVE 02/08/2020 1106   BILIRUBINUR NEGATIVE 02/08/2020 1106   BILIRUBINUR Negative 06/13/2015 1012   KETONESUR NEGATIVE 02/08/2020 1106   PROTEINUR 100 (A) 02/08/2020 1106   UROBILINOGEN 0.2 12/21/2014 1336   NITRITE NEGATIVE 02/08/2020 1106   LEUKOCYTESUR NEGATIVE 02/08/2020 1106    Radiological Exams on Admission: DG Chest 2 View  Result Date: 02/13/2020 CLINICAL DATA:  Chest pain. EXAM: CHEST - 2 VIEW COMPARISON:  CT dated July 01, 2019. FINDINGS: There are coarse interstitial lung markings bilaterally with hazy bibasilar airspace opacities which appear to be new since 2015 chest x-ray and the patient's recent CT. The heart size is mildly enlarged but stable from prior study. The patient is status post prior CABG. There is no pneumothorax. There are dilated loops of small bowel in the upper abdomen. There are small bilateral pleural effusions. IMPRESSION: 1. Prominent interstitial lung markings with new small bilateral pleural effusions raises concern for developing congestive heart failure. An underlying atypical infectious process is not excluded. 2. Dilated loops of bowel in the upper  abdomen consider a dedicated abdominal radiograph for further evaluation. Electronically Signed   By: Constance Holster M.D.   On: 02/13/2020 01:17    EKG: Independently reviewed. Interpretation : NSR with nonspecific ST-T wave changes  Assessment/Plan 64 year old male with history of CAD status post CABG, CKD 4, diabetes, who presents to the emergency room with a several day history of shortness of breath now associated with left-sided chest pain that started 2 days ago.    NSTEMI (non-ST elevated myocardial infarction) ([place])   Coronary artery disease with history of CABG -Troponin of over 400 patient presents with  chest pain but with nonspecific ST-T wave changes on EKG -Possible NSTEMI 2 can be related to demand ischemia from acute heart failure -Continue heparin infusion -Continue aspirin, beta-blocker and statins -Nitropaste with morphine for breakthrough  -Cardiology consult in the a.m.    Acute diastolic CHF (congestive heart failure) (HCC)   Hypertensive urgency -No prior history of congestive heart failure with echocardiogram in June 2021 showing EF 50 to 55% -Chest x-ray showing pulmonary vascular congestion.  BNP was elevated -Continue IV Lasix -Daily weights, intake and output monitoring -Blood pressure control    Type 2 diabetes mellitus with diabetic peripheral angiopathy without gangrene (HCC) -Sliding scale insulin coverage    CKD stage 4 due to type 2 diabetes mellitus ([place]) -Renal function for the most part at baseline.  Continue to monitor    Moderate aortic valve stenosis -Avoid aggressive diuresis    DVT prophylaxis: Full dose heparin Code Status: full code  Family Communication:  none  Disposition Plan: Back to previous home environment Consults called: Cardiology Status:At the time of admission, it appears that the appropriate admission status for this patient is INPATIENT. This is judged to be reasonable and necessary in order to provide the required intensity of service to ensure the patient's safety given the presenting symptoms, physical exam findings, and initial radiographic and laboratory data in the context of their  Comorbid conditions.   Patient requires inpatient status due to high intensity of service, high risk for further deterioration and high frequency of surveillance required.   I certify that at the point of admission it is my clinical judgment that the patient will require inpatient hospital care spanning beyond [facility] MD [facility]     02/13/2020, 3:53 AM

## 2020-02-13 NOTE — ED Triage Notes (Signed)
Pt to the er for chest pain that started Monday night on the right side and now it is on the left side and the left shoulder. Pt reports nausea and vomiting.

## 2020-02-13 NOTE — ED Notes (Signed)
EMS reports patient complaining of shortness of breath x 3 days. Also chest pain associated with it x 3 days. Sharp pain located centrally. Pain on palpation. History of kidney failure. BP 154/86 HR 92 98% RA End tital 35 R 17 CBG 195. Patient ambulatory to the bathroom on arrival to the ED. Will not wait for specimen cup to be given. 12 lead with EMS WNL.

## 2020-02-13 NOTE — Progress Notes (Signed)
ANTICOAGULATION CONSULT NOTE -   Pharmacy Consult for Heparin Indication: chest pain/ACS  Allergies  Allergen Reactions  . Contrast Media [Iodinated Diagnostic Agents] Rash and Other (See Comments)    Got very hot and red   . Glipizide Other (See Comments)    ANTIDIABETICS. Burning  . Metrizamide Other (See Comments)    Got very hot and red  . Penicillins Hives and Swelling    Has patient had a PCN reaction causing immediate rash, facial/tongue/throat swelling, SOB or lightheadedness with hypotension: yes Has patient had a PCN reaction causing severe rash involving mucus membranes or skin necrosis: no  Has patient had a PCN reaction that required hospitalization: yes Has patient had a PCN reaction occurring within the last 10 years: no If all of the above answers are "NO", then may proceed with Cephalosporin use.     Patient Measurements: Height: 5\' 7"  (170.2 cm) Weight: (!) 107.7 kg (237 lb 8 oz) IBW/kg (Calculated) : 66.1 HEPARIN DW (KG): 90.2  Vital Signs: Temp: 97.8 F (36.6 C) (07/24 1307) Temp Source: Oral (07/24 1307) BP: 114/73 (07/24 1317) Pulse Rate: 71 (07/24 1317)  Labs: Recent Labs    02/13/20 0033 02/13/20 0302 02/13/20 0525 02/13/20 1326  HGB 11.0*  --   --   --   HCT 35.0*  --   --   --   PLT 153  --   --   --   APTT  --   --  76*  --   LABPROT 12.9  --   --   --   INR 1.0  --   --   --   HEPARINUNFRC  --   --   --  0.10*  CREATININE 3.22*  --   --   --   TROPONINIHS 403* 470*  --   --     Estimated Creatinine Clearance: 26.4 mL/min (A) (by C-G formula based on SCr of 3.22 mg/dL (H)).   Medical History: Past Medical History:  Diagnosis Date  . Adenomatous colon polyp   . Allergy   . Angina at rest [facility])    chronic  . Anxiety   . Arthritis    RA  . Bell's palsy 2007  . Carotid artery occlusion   . Cataract    Dr. Dawna Part  . Coronary artery disease   . Depression   . Diabetes mellitus   . Diverticulosis   . Duodenitis   . DVT  (deep venous thrombosis) (HCC)    in leg  . Dyspnea    DOE  . Gastropathy   . GERD (gastroesophageal reflux disease)   . Heart murmur   . Helicobacter pylori gastritis   . Hyperlipidemia   . Hypertension   . Kidney failure   . Mild aortic stenosis   . Neuropathy   . Obesity   . Peripheral vascular disease ([place])   . Post splenectomy syndrome   . Psoriasis   . Psoriatic arthritis ([facility])   . Renal disorder    stage 4 kidney failure  . Sleep apnea    uses cpap  . SOB (shortness of breath)   . Stroke ([place])   . Tobacco use disorder    recently quit  . Tubular adenoma 08/2013   Dr. Hilarie Fredrickson  . Weak urinary stream     Medications:  Medications Prior to Admission  Medication Sig Dispense Refill Last Dose  . albuterol (PROAIR HFA) 108 (90 Base) MCG/ACT inhaler Inhale 2 puffs into the lungs every  6 (six) hours as needed for shortness of breath. 18 g 3 Unknown at PRN  . aspirin 81 MG tablet Take 81 mg by mouth daily.     [name] buPROPion (WELLBUTRIN XL) 150 MG 24 hr tablet Take 1 tablet (150 mg total) by mouth daily. 90 tablet 3 12-24 hours at Unknown  . busPIRone (BUSPAR) 10 MG tablet TAKE 2 TABLETS(20 MG) BY MOUTH TWICE DAILY (Patient taking differently: Take 20 mg by mouth 2 (two) times daily. ) 360 tablet 3 12-24 hours at Unknown  . calcitRIOL (ROCALTROL) 0.25 MCG capsule Take 0.25 mcg by mouth See admin instructions. Take 1 capsule (0.12mcg) once weekly on Monday then Wednesday then Friday   As directed at Unknown  . carvedilol (COREG) 12.5 MG tablet Take 1 tablet (12.5 mg total) by mouth 2 (two) times daily. 180 tablet 1 12-24 hours at Unknown  . clopidogrel (PLAVIX) 75 MG tablet TAKE 1 TABLET(75 MG) BY MOUTH DAILY (Patient taking differently: Take 75 mg by mouth daily. ) 90 tablet 3 12-24 hours at Unknown  . cyclobenzaprine (FLEXERIL) 10 MG tablet Take 10 mg by mouth 3 (three) times daily as needed for muscle spasms.    Unknown at PRN  . Dextran 70-Hypromellose (ARTIFICIAL TEARS) 0.1-0.3  % SOLN Place 1 drop into both eyes 4 (four) times daily as needed (dry eyes).   Unknown at PRN  . DULoxetine (CYMBALTA) 60 MG capsule Take 1 capsule (60 mg total) by mouth daily. 90 capsule 3 12-24 hours at Unknown  . finasteride (PROSCAR) 5 MG tablet Take 5 mg by mouth daily.   12-24 hours at Unknown  . furosemide (LASIX) 20 MG tablet Take 1 tablet (20 mg total) by mouth daily. 90 tablet 3 12-24 hours at Unknown  . losartan (COZAAR) 100 MG tablet TAKE 1 TABLET(100 MG) BY MOUTH DAILY (Patient taking differently: Take 100 mg by mouth daily. ) 30 tablet 2 12-24 hours at Unknown  . Multiple Vitamin (MULTIVITAMIN) capsule Take 1 capsule by mouth daily.      . nitroGLYCERIN (NITROSTAT) 0.4 MG SL tablet Place 1 tablet (0.4 mg total) under the tongue every 5 (five) minutes as needed. 25 tablet 1 Unknown at PRN  . nystatin (MYCOSTATIN/NYSTOP) powder Apply 1 application topically 3 (three) times daily. Substitute other antifungal powder as needed if insurance issues 60 g 2 Unknown at PRN  . oxyCODONE-acetaminophen (PERCOCET) 5-325 MG tablet Take 1 tablet by mouth every 4 (four) hours as needed for severe pain. 20 tablet 0 Unknown at PRN  . sertraline (ZOLOFT) 100 MG tablet Take 2 tablets (200 mg total) by mouth daily. 180 tablet 3 12-24 hours at Unknown  . simvastatin (ZOCOR) 20 MG tablet TAKE 1 TABLET(20 MG) BY MOUTH DAILY AT 6 PM (Patient taking differently: Take 20 mg by mouth every evening. ) 90 tablet 2 12-24 hours at Unknown  . tamsulosin (FLOMAX) 0.4 MG CAPS capsule Take 1 capsule by mouth daily (Patient taking differently: Take 0.4 mg by mouth daily. ) 90 capsule 3 12-24 hours at Unknown    Assessment: Heparin initiated for ACS.  No anticoagulants PTA per med list.  Baseline labs ordered.   7/24 Heparin 4000 units bolus x 1 then infusion at 1200 units/hr  Goal of Therapy:  Heparin level 0.3-0.7 units/ml Monitor platelets by anticoagulation protocol: Yes   Plan:  7/24 @1326  HL= 0.10,  subtherapeutic. Will order Heparin 2700 units bolus x 1 then increase infusion to 1500 units/hr Check HL 8 hours after rate  change (crcl 26)  Jaleia Hanke A 02/13/2020,2:33 PM

## 2020-02-13 NOTE — Consult Note (Addendum)
Cardiology Consultation:   Patient ID: [name]; [id number]; 1955/09/21   Admit date: 02/13/2020 Date of Consult: 02/13/2020  Primary Care Provider: [name], MD Primary Cardiologist: Fletcher Anon Primary Electrophysiologist:  None   Patient Profile:   [name] [name] is a 64 y.o. male with a hx of CAD status post CABG in 2012, moderate aortic stenosis, carotid artery disease status post left-sided CEA, DM2, CKD stage IV, anemia of chronic disease, HTN, HLD, PAD status post extensive lower extremity revascularization by vascular surgery in 05/2019, tobacco use, obesity, and psoriasis who is being seen today for the evaluation of NSTEMI at the request of Dr. Damita Dunnings.  History of Present Illness:   [name] most recently underwent ischemic evaluation via stress testing in 2013 which showed no evidence of ischemia.  Echo in 12/2018 showed normal LV systolic function with moderate aortic stenosis with an aortic valve area 1.28 cm and a mean gradient of 20 mmHg and a mildly dilated aortic root at 4 cm.  He was most recently seen by Dr. Fletcher Anon in 10/2019 and was doing well from a cardiac perspective.  He continues to cope with the passing of his wife from pancreatic cancer.  His renal function continued to deteriorate and is followed by nephrology.  He underwent echo and 12/2019 which demonstrated low normal EF of 50 to 55%, no regional wall motion abnormalities, mild LVH, grade 2 diastolic dysfunction, normal RV systolic function and RV cavity size, mildly dilated left atrium, mild to moderate mitral regurgitation, moderate aortic stenosis with a valve area of 1.06 cm, and a mean gradient of 26 mm.  He was seen in the ED on 02/08/2020 with right-sided flank pain that was worse with movement.  CT renal stone protocol showed no acute intra-abdominal findings.  Laboratory evaluation was unrevealing.  He returned to [facility] ED on 7/24 with a several week history of intermittent left-sided chest  pain and dyspnea.  Pain is described to be pressure-like that radiates to the left shoulder and is accompanied with shortness of breath.  His symptoms progressively got worse earlier this week and are exacerbated with exertion.  EKG showed sinus rhythm with nonspecific ST-T changes as outlined below.  Initial troponin of 403 with a delta of 470, BNP 713, COVID-19 negative, BUN 33, serum creatinine 3.22, potassium 4.9, WBC 10.5, Hgb 11.0.  Chest x-ray showed new bilateral pleural effusions.  In the ED, he was given ASA, morphine, and Nitropaste was applied.  He has been started on a heparin drip.  He is currently chest pain-free.    Past Medical History:  Diagnosis Date  . Adenomatous colon polyp   . Allergy   . Angina at [facility])    chronic  . Anxiety   . Arthritis    RA  . Bell's palsy 2007  . Carotid artery occlusion   . Cataract    Dr. Dawna Part  . Coronary artery disease   . Depression   . Diabetes mellitus   . Diverticulosis   . Duodenitis   . DVT (deep venous thrombosis) (HCC)    in leg  . Dyspnea    DOE  . Gastropathy   . GERD (gastroesophageal reflux disease)   . Heart murmur   . Helicobacter pylori gastritis   . Hyperlipidemia   . Hypertension   . Kidney failure   . Mild aortic stenosis   . Neuropathy   . Obesity   . Peripheral vascular disease ([place])   .  Post splenectomy syndrome   . Psoriasis   . Psoriatic arthritis ([place])   . Renal disorder    stage 4 kidney failure  . Sleep apnea    uses cpap  . SOB (shortness of breath)   . Stroke ([place])   . Tobacco use disorder    recently quit  . Tubular adenoma 08/2013   Dr. Hilarie Fredrickson  . Weak urinary stream     Past Surgical History:  Procedure Laterality Date  . CARDIAC CATHETERIZATION  08/2010   LAD: 80% ISR, RCA: 80% ostial, OM 80-90%  . CAROTID ENDARTERECTOMY  08/2010   left/ Dr. Kellie Simmering  . CATARACT EXTRACTION W/PHACO Left 07/09/2017   Procedure: CATARACT EXTRACTION PHACO AND INTRAOCULAR LENS PLACEMENT (IOC);   Surgeon: Birder Robson, MD;  Location: [facility] ORS;  Service: Ophthalmology;  Laterality: Left;  [place] 00:23 AP% 14.2 CDE 3.26 Fluid pack lot # [id number]  . CATARACT EXTRACTION W/PHACO Right 09/10/2017   Procedure: CATARACT EXTRACTION PHACO AND INTRAOCULAR LENS PLACEMENT (IOC);  Surgeon: Birder Robson, MD;  Location: [facility] ORS;  Service: Ophthalmology;  Laterality: Right;  [place] 00:26.4 AP% 17.3 CDE 4.59 Fluid Pack Lot # [id number]  . COLONOSCOPY    . CORONARY ANGIOPLASTY     LAD: before CABG  . CORONARY ARTERY BYPASS GRAFT  09/06/2010   At [facility]: LIMA to LAD, left radial to RCA, sequential SVG to OM3 and 4  . heart stents  Jan 2011   leg stents 06/2009 and 03/2010  . LOWER EXTREMITY ANGIOGRAPHY Left 06/09/2019   Procedure: LOWER EXTREMITY ANGIOGRAPHY;  Surgeon: Katha Cabal, MD;  Location: [facility];  Service: Cardiovascular;  Laterality: Left;  . PTA of illiac and SFA  multiple   Dr. Ronalee Belts, s/p revision 11/2012  . SPLENECTOMY    . VASCULAR SURGERY     LEG STENTS     Home Meds: Prior to Admission medications   Medication Sig Start Date End Date Taking? Authorizing Provider  albuterol (PROAIR HFA) 108 (90 Base) MCG/ACT inhaler Inhale 2 puffs into the lungs every 6 (six) hours as needed for shortness of breath. 09/14/19  Yes [name], MD  aspirin 81 MG tablet Take 81 mg by mouth daily.   Yes [provider]  buPROPion (WELLBUTRIN XL) 150 MG 24 hr tablet Take 1 tablet (150 mg total) by mouth daily. 12/03/19  Yes [name], MD  busPIRone (BUSPAR) 10 MG tablet TAKE 2 TABLETS(20 MG) BY MOUTH TWICE DAILY Patient taking differently: Take 20 mg by mouth 2 (two) times daily.  04/07/19  Yes [name], MD  calcitRIOL (ROCALTROL) 0.25 MCG capsule Take 0.25 mcg by mouth See admin instructions. Take 1 capsule (0.46mcg) once weekly on Monday then Wednesday then Friday   Yes [provider]  carvedilol (COREG) 12.5 MG tablet Take 1 tablet (12.5 mg  total) by mouth 2 (two) times daily. 12/10/19  Yes Wellington Hampshire, MD  clopidogrel (PLAVIX) 75 MG tablet TAKE 1 TABLET(75 MG) BY MOUTH DAILY Patient taking differently: Take 75 mg by mouth daily.  12/08/19  Yes Wellington Hampshire, MD  cyclobenzaprine (FLEXERIL) 10 MG tablet Take 10 mg by mouth 3 (three) times daily as needed for muscle spasms.  12/06/19  Yes [provider]  Dextran 70-Hypromellose (ARTIFICIAL TEARS) 0.1-0.3 % SOLN Place 1 drop into both eyes 4 (four) times daily as needed (dry eyes).   Yes [provider]  DULoxetine (CYMBALTA) 60 MG capsule Take 1 capsule (60 mg total)  by mouth daily. 12/03/19  Yes [name], MD  finasteride (PROSCAR) 5 MG tablet Take 5 mg by mouth daily.   Yes [provider]  furosemide (LASIX) 20 MG tablet Take 1 tablet (20 mg total) by mouth daily. 12/08/19  Yes Wellington Hampshire, MD  losartan (COZAAR) 100 MG tablet TAKE 1 TABLET(100 MG) BY MOUTH DAILY Patient taking differently: Take 100 mg by mouth daily.  03/21/18  Yes Wellington Hampshire, MD  Multiple Vitamin (MULTIVITAMIN) capsule Take 1 capsule by mouth daily.    Yes [provider]  nitroGLYCERIN (NITROSTAT) 0.4 MG SL tablet Place 1 tablet (0.4 mg total) under the tongue every 5 (five) minutes as needed. 06/24/12  Yes Wellington Hampshire, MD  nystatin (MYCOSTATIN/NYSTOP) powder Apply 1 application topically 3 (three) times daily. Substitute other antifungal powder as needed if insurance issues 01/06/20  Yes [name], MD  oxyCODONE-acetaminophen (PERCOCET) 5-325 MG tablet Take 1 tablet by mouth every 4 (four) hours as needed for severe pain. 02/08/20  Yes Harvest Dark, MD  sertraline (ZOLOFT) 100 MG tablet Take 2 tablets (200 mg total) by mouth daily. 12/07/19 12/06/20 Yes [name], MD  simvastatin (ZOCOR) 20 MG tablet TAKE 1 TABLET(20 MG) BY MOUTH DAILY AT 6 PM Patient taking differently: Take 20 mg by mouth every evening.  11/20/19  Yes Wellington Hampshire, MD  tamsulosin (FLOMAX) 0.4 MG CAPS capsule Take 1 capsule by mouth daily Patient taking differently: Take 0.4 mg by mouth daily.  01/20/20  Yes [name], MD    Inpatient Medications: Scheduled Meds: . carvedilol  12.5 mg Oral BID  . furosemide  20 mg Intravenous Q12H  . insulin aspart  0-15 Units Subcutaneous TID WC  . insulin aspart  0-5 Units Subcutaneous QHS  . nitroGLYCERIN  1 inch Topical Q6H  . simvastatin  20 mg Oral q1800  . sodium chloride flush  3 mL Intravenous Once   Continuous Infusions: . heparin 1,200 Units/hr (02/13/20 0253)   PRN Meds: acetaminophen, albuterol, ALPRAZolam, morphine injection, nitroGLYCERIN, nitroGLYCERIN, ondansetron (ZOFRAN) IV, oxyCODONE-acetaminophen  Allergies:   Allergies  Allergen Reactions  . Contrast Media [Iodinated Diagnostic Agents] Rash and Other (See Comments)    Got very hot and red   . Glipizide Other (See Comments)    ANTIDIABETICS. Burning  . Metrizamide Other (See Comments)    Got very hot and red  . Penicillins Hives and Swelling    Has patient had a PCN reaction causing immediate rash, facial/tongue/throat swelling, SOB or lightheadedness with hypotension: yes Has patient had a PCN reaction causing severe rash involving mucus membranes or skin necrosis: no  Has patient had a PCN reaction that required hospitalization: yes Has patient had a PCN reaction occurring within the last 10 years: no If all of the above answers are "NO", then may proceed with Cephalosporin use.     Social History:   Social History   Socioeconomic History  . Marital status: Widowed    Spouse name: Not on file  . Number of children: 2  . Years of education: Not on file  . Highest education level: Not on file  Occupational History  . Occupation: Chief Operating Officer at [employer]: Disabled mostly due to neuropathy  Tobacco Use  . Smoking status: Former Smoker    Packs/day: 0.75    Years: 40.00    Pack years: 30.00      Types: Cigarettes    Quit date: 04/15/2017  Years since quitting: 2.8  . Smokeless tobacco: Never Used  . Tobacco comment: 1 cigarette a day  Vaping Use  . Vaping Use: Never used  Substance and Sexual Activity  . Alcohol use: No  . Drug use: No  . Sexual activity: Never  Other Topics Concern  . Not on file  Social History Narrative   Wife died 2019/02/06      Has living will   Gearldine Shown and his wife Levada Dy should be his health care POA   Would accept resuscitation --but no prolonged ventilation   No tube feeds if cognitively unaware   Social Determinants of Health   Financial Resource Strain: Low Risk   . Difficulty of Paying Living Expenses: Not very hard  Food Insecurity: No Food Insecurity  . Worried About Charity fundraiser in the Last Year: Never true  . Ran Out of Food in the Last Year: Never true  Transportation Needs: No Transportation Needs  . Lack of Transportation (Medical): No  . Lack of Transportation (Non-Medical): No  Physical Activity:   . Days of Exercise per Week:   . Minutes of Exercise per Session:   Stress: No Stress Concern Present  . Feeling of Stress : Only a little  Social Connections: Unknown  . Frequency of Communication with Friends and Family: More than three times a week  . Frequency of Social Gatherings with Friends and Family: Not on file  . Attends Religious Services: Not on file  . Active Member of Clubs or Organizations: Not on file  . Attends Archivist Meetings: Not on file  . Marital Status: Not on file  Intimate Partner Violence: Unknown  . Fear of Current or Ex-Partner: No  . Emotionally Abused: No  . Physically Abused: No  . Sexually Abused: Not on file     Family History:   Family History  Problem Relation Age of Onset  . Lung cancer Father 34  . Arthritis Father   . Breast cancer Sister 74  . Alcoholism Sister   . Dementia Mother   . Alzheimer's disease Mother   . Arthritis Mother   . Alcoholism  Brother   . Epilepsy Sister   . Diabetes Paternal Grandfather   . Colon polyps Paternal Grandfather 28  . Alcoholism Sister   . Alcoholism Sister   . Alcoholism Brother     ROS:  Review of Systems  Constitutional: Positive for malaise/fatigue. Negative for chills, diaphoresis, fever and weight loss.  HENT: Negative for congestion.   Eyes: Negative for discharge and redness.  Respiratory: Positive for shortness of breath. Negative for cough, sputum production and wheezing.   Cardiovascular: Positive for chest pain and leg swelling. Negative for palpitations, orthopnea, claudication and PND.  Gastrointestinal: Negative for abdominal pain, heartburn, nausea and vomiting.  Genitourinary: Positive for flank pain.  Musculoskeletal: Negative for falls and myalgias.  Skin: Negative for rash.  Neurological: Positive for weakness. Negative for dizziness, tingling, tremors, sensory change, speech change, focal weakness and loss of consciousness.  Endo/Heme/Allergies: Does not bruise/bleed easily.  Psychiatric/Behavioral: Negative for substance abuse. The patient is not nervous/anxious.   All other systems reviewed and are negative.     Physical Exam/Data:   Vitals:   02/13/20 0239 02/13/20 0356 02/13/20 0534 02/13/20 0610  BP: (!) 161/94 (!) 161/94 (!) 161/94   Pulse: 98  55   Resp: (!) 24 23 21    Temp:    98.9 F (37.2 C)  TempSrc:  Oral  SpO2: 95%  95%   Weight:      Height:        Intake/Output Summary (Last 24 hours) at 02/13/2020 0843 Last data filed at 02/13/2020 0711 Gross per 24 hour  Intake 212 ml  Output --  Net 212 ml   Filed Weights   02/13/20 0034  Weight: (!) 99.8 kg   Body mass index is 34.46 kg/m.   Physical Exam: General: Well developed, well nourished, in no acute distress. Head: Normocephalic, atraumatic, sclera non-icteric, no xanthomas, nares without discharge.  Neck: Negative for carotid bruits. JVD not elevated. Lungs: Clear bilaterally to  auscultation without wheezes, rales, or rhonchi. Breathing is unlabored. Heart: RRR with S1 S2. III/VI systolic murmur, no rubs, or gallops appreciated. Abdomen: Soft, non-tender, non-distended with normoactive bowel sounds. No hepatomegaly. No rebound/guarding. No obvious abdominal masses. Msk:  Strength and tone appear normal for age. Extremities: No clubbing or cyanosis.  Trace bilateral pretibial edema. Distal pedal pulses are 2+ and equal bilaterally. Neuro: Alert and oriented X 3. No facial asymmetry. No focal deficit. Moves all extremities spontaneously. Psych:  Responds to questions appropriately with a normal affect.   EKG:  The EKG was personally reviewed and demonstrates: NSR, 93 bpm, first-degree AV block, left axis deviation, poor R wave progression along the precordial leads, nonspecific ST-T changes Telemetry:  Telemetry was personally reviewed and demonstrates: SR  Weights: [employer]   02/13/20 0034  Weight: (!) 99.8 kg    Relevant CV Studies:  2D echo 01/11/2019: 1. Left ventricular ejection fraction, by estimation, is 50 to 55%. The  left ventricle has low normal function. The left ventricle has no regional  wall motion abnormalities. There is mild left ventricular hypertrophy.  Left ventricular diastolic  parameters are consistent with Grade II diastolic dysfunction  (pseudonormalization).  2. Right ventricular systolic function is normal. The right ventricular  size is normal. There is moderately elevated pulmonary artery systolic  pressure.  3. Left atrial size was mildly dilated.  4. The mitral valve is abnormal. Mild to moderate mitral valve  regurgitation. No evidence of mitral stenosis.  5. The aortic valve is abnormal. Aortic valve regurgitation is not  visualized. Moderate aortic valve stenosis. Aortic valve area, by VTI  measures 1.06 cm. Aortic valve mean gradient measures 26.0 mmHg.  6. Aortic dilatation noted. There is mild dilatation of the  ascending  aorta measuring 41 mm.  7. The inferior vena cava is normal in size with <50% respiratory  variability, suggesting right atrial pressure of 8 mmHg.  Laboratory Data:  Chemistry Recent Labs  Lab 02/08/20 1107 02/13/20 0033  NA 138 139  K 5.1 4.9  CL 104 105  CO2 28 26  GLUCOSE 112* 156*  BUN 39* 33*  CREATININE 3.17* 3.22*  CALCIUM 9.3 8.5*  GFRNONAA 19* 19*  GFRAA 22* 22*  ANIONGAP 6 8    Recent Labs  Lab 02/08/20 1107 02/13/20 0033  PROT 7.1 7.1  ALBUMIN 3.5 3.4*  AST 16 18  ALT 19 15  ALKPHOS 84 95  BILITOT 0.7 0.6   Hematology Recent Labs  Lab 02/08/20 1107 02/13/20 0033  WBC 6.6 10.5  RBC 3.35* 3.51*  HGB 10.6* 11.0*  HCT 32.7* 35.0*  MCV 97.6 99.7  MCH 31.6 31.3  MCHC 32.4 31.4  RDW 13.8 14.7  PLT 127* 153   Cardiac EnzymesNo results for input(s): TROPONINI in the last 168 hours. No results for input(s): TROPIPOC in the last 168 hours.  BNP Recent Labs  Lab 02/13/20 0033  BNP 713.3*    DDimer No results for input(s): DDIMER in the last 168 hours.  Radiology/Studies:  DG Chest 2 View  Result Date: 02/13/2020 IMPRESSION: 1. Prominent interstitial lung markings with new small bilateral pleural effusions raises concern for developing congestive heart failure. An underlying atypical infectious process is not excluded. 2. Dilated loops of bowel in the upper abdomen consider a dedicated abdominal radiograph for further evaluation. Electronically Signed   By: Constance Holster M.D.   On: 02/13/2020 01:17    Assessment and Plan:   1.  CAD involving the native coronary arteries status post CABG with non-STEMI: -Currently chest pain-free -Overall, this is a difficult situation given the patient's complex medical history including CKD stage IV and underlying aortic stenosis -Check echo -Continue heparin drip -ASA -Stop Nitropaste -Start Imdur -We will plan to have the patient's primary cardiologist round on 7/25 to provide further  recommendations regarding potential LHC to further evaluate his coronary disease and aortic stenosis.  However, this would place the patient at significant risk of needing dialysis and therefore nephrology may need to be brought on his case to assist -If cath is decided upon he would need to be premedicated for contrast allergy  2.  Moderate aortic stenosis: -Check echo as above -Potential LHC as outlined above  3.  CKD stage IV: -Stable -Consider consulting nephrology to assist with his care with potential LHC occurring early next week  4.  HTN: -Mildly elevated in the ED -Add Imdur Continue carvedilol-  5.  HLD: -Most recent LDL of 34 -Check lipid panel -PTA statin  6.  Anemia of chronic disease: -Stable    For questions or updates, please contact [place] Please consult [email or web address] for contact info under Cardiology/STEMI.   Signed, Christell Faith, PA-C [place] Pager: [phone] 02/13/2020, 8:43 AM   Patient examined chart reviewed Discussed care with patient and PA. Exam with overweight white male in no distress Lungs clear AS murmur with diminished S2 Post CEA plus one LE edema bilateral He describes progressive angina with exertion and his by pass grafts are 66 years old. Troponin up in setting of renal failure No acute ECG changes Continue heparin and start nitrates. Trend ECG and troponin He understands to further evaluate his heart/valve he will need contrast dye and heart cath. Given his symptoms I think there is a high likelihood of graft failure and need for intervention Would have nephrology see in hospital for possible need for access and suspect he will need right and left heart cath with grafts on Monday   Jenkins Rouge MD [facility]

## 2020-02-13 NOTE — Progress Notes (Signed)
PROGRESS NOTE    [name] [name]  XIP:[id number] DOB: 1955/09/25 DOA: 02/13/2020 PCP: [name], MD   Brief Narrative: [name] [name] is a 64 y.o. male with medical history significant for CAD status post CABG, CKD 4, diabetes. Patient presented secondary to chest pain and shortness of breath with evidence of NSTEMI on admission. Heparin drip started. Cardiology consulted.    Assessment & Plan:   Principal Problem:   NSTEMI (non-ST elevated myocardial infarction) ([place]) Active Problems:   Coronary artery disease   Type 2 diabetes mellitus with diabetic peripheral angiopathy without gangrene ([place])   CKD stage 4 due to type 2 diabetes mellitus ([facility])   Moderate aortic valve stenosis   Acute diastolic CHF (congestive heart failure) ([place])   Hypertensive urgency   NSTEMI CAD s/p CABG Patient now chest pain free. Complicated by CKD stage IV. Cardiology consulted. -Cardiology recommendations: Continue heparin drip, Transthoracic Echocardiogram, aspirin, Imdur, anticipate probable heart catheterization; if heart cath, will need to consult nephrology in setting of CKD stage IV   Moderate aortic stenosis Slightly worsened from previous per recent cardiology note however still moderate. Transthoracic Echocardiogram ordered as mentioned above  Essential hypertension BP is slightly soft -Cardiology recommendations: Imdur, Coreg  Back pain Related to fall suffered some months ago. Tenderness over sacrum -DG sacrum  Chronic diastolic heart failure -Continue Lasix  CKD Stage IV Stable but concerning in setting of above and possible LHC precipitating ESRD  Hyperlipidemia -Continue simvastatin  Anemia of chronic disease In setting of CKD. Mild. Hemoglobin stable.   DVT prophylaxis: Heparin drip Code Status:   Code Status: Full Code Family Communication: Grandson at bedside Disposition Plan: Discharge pending cardiology recommendations for management of  NSTEMI   Consultants:   Cardiology  Procedures:   None  Antimicrobials:  None    Subjective: No chest pain currently. Some back pain  Objective: Vitals:   02/13/20 0900 02/13/20 1230 02/13/20 1307 02/13/20 1317  BP: 105/67 101/67 (!) 89/69 114/73  Pulse: 82 76 72 71  Resp: 18 20 18    Temp: 97.8 F (36.6 C) 97.9 F (36.6 C) 97.8 F (36.6 C)   TempSrc: Oral Oral Oral   SpO2: 97% 100% 100%   Weight:   (!) 107.7 kg   Height:   5\' 7"  (1.702 m)     Intake/Output Summary (Last 24 hours) at 02/13/2020 1510 Last data filed at 02/13/2020 1123 Gross per 24 hour  Intake 262.4 ml  Output --  Net 262.4 ml   Filed Weights   02/13/20 0034 02/13/20 1307  Weight: (!) 99.8 kg (!) 107.7 kg    Examination:  General exam: Appears calm and comfortable Respiratory system: Clear to auscultation. Respiratory effort normal. Cardiovascular system: S1 & S2 heard, RRR. 2/6 systolic murmur Gastrointestinal system: Abdomen is nondistended, soft and nontender. No organomegaly or masses felt. Normal bowel sounds heard. Central nervous system: Alert and oriented. No focal neurological deficits. Musculoskeletal: 2+ edema. No calf tenderness. Minimal tenderness over sacrum Skin: No cyanosis. No rashes Psychiatry: Judgement and insight appear normal. Mood & affect appropriate.     Data Reviewed: I have personally reviewed following labs and imaging studies  CBC Lab Results  Component Value Date   WBC 10.5 02/13/2020   RBC 3.51 (L) 02/13/2020   HGB 11.0 (L) 02/13/2020   HCT 35.0 (L) 02/13/2020   MCV 99.7 02/13/2020   MCH 31.3 02/13/2020   PLT 153 02/13/2020   MCHC 31.4 02/13/2020   RDW 14.7 02/13/2020  LYMPHSABS 2.1 03/15/2017   MONOABS 0.4 03/15/2017   EOSABS 0.2 03/15/2017   BASOSABS 0.1 51/76/1607     Last metabolic panel Lab Results  Component Value Date   NA 139 02/13/2020   K 4.9 02/13/2020   CL 105 02/13/2020   CO2 26 02/13/2020   BUN 33 (H) 02/13/2020    CREATININE 3.22 (H) 02/13/2020   GLUCOSE 156 (H) 02/13/2020   GFRNONAA 19 (L) 02/13/2020   GFRAA 22 (L) 02/13/2020   CALCIUM 8.5 (L) 02/13/2020   PHOS 4.5 04/07/2019   PROT 7.1 02/13/2020   ALBUMIN 3.4 (L) 02/13/2020   BILITOT 0.6 02/13/2020   ALKPHOS 95 02/13/2020   AST 18 02/13/2020   ALT 15 02/13/2020   ANIONGAP 8 02/13/2020    CBG (last 3)  Recent Labs    02/13/20 0820 02/13/20 1218  GLUCAP 128* 155*     GFR: Estimated Creatinine Clearance: 26.4 mL/min (A) (by C-G formula based on SCr of 3.22 mg/dL (H)).  Coagulation Profile: Recent Labs  Lab 02/13/20 0033  INR 1.0    Recent Results (from the past 240 hour(s))  SARS Coronavirus 2 by RT PCR (hospital order, performed in [facility] hospital lab) Nasopharyngeal Nasopharyngeal Swab     Status: None   Collection Time: 02/13/20  3:01 AM   Specimen: Nasopharyngeal Swab  Result Value Ref Range Status   SARS Coronavirus 2 NEGATIVE NEGATIVE Final    Comment: (NOTE) SARS-CoV-2 target nucleic acids are NOT DETECTED.  The SARS-CoV-2 RNA is generally detectable in upper and lower respiratory specimens during the acute phase of infection. The lowest concentration of SARS-CoV-2 viral copies this assay can detect is 250 copies / mL. A negative result does not preclude SARS-CoV-2 infection and should not be used as the sole basis for treatment or other patient management decisions.  A negative result may occur with improper specimen collection / handling, submission of specimen other than nasopharyngeal swab, presence of viral mutation(s) within the areas targeted by this assay, and inadequate number of viral copies (<250 copies / mL). A negative result must be combined with clinical observations, patient history, and epidemiological information.  Fact Sheet for Patients:   StrictlyIdeas.no  Fact Sheet for Healthcare Providers: BankingDealers.co.za  This test is not yet  approved or  cleared by the [place] FDA and has been authorized for detection and/or diagnosis of SARS-CoV-2 by FDA under an Emergency Use Authorization (EUA).  This EUA will remain in effect (meaning this test can be used) for the duration of the COVID-19 declaration under Section 564(b)(1) of the Act, 21 U.S.C. section 360bbb-3(b)(1), unless the authorization is terminated or revoked sooner.  Performed at [facility], [address], [place], [place] [address]         Radiology Studies: DG Chest 2 View  Result Date: 02/13/2020 CLINICAL DATA:  Chest pain. EXAM: CHEST - 2 VIEW COMPARISON:  CT dated July 01, 2019. FINDINGS: There are coarse interstitial lung markings bilaterally with hazy bibasilar airspace opacities which appear to be new since 2015 chest x-ray and the patient's recent CT. The heart size is mildly enlarged but stable from prior study. The patient is status post prior CABG. There is no pneumothorax. There are dilated loops of small bowel in the upper abdomen. There are small bilateral pleural effusions. IMPRESSION: 1. Prominent interstitial lung markings with new small bilateral pleural effusions raises concern for developing congestive heart failure. An underlying atypical infectious process is not excluded. 2. Dilated loops of  bowel in the upper abdomen consider a dedicated abdominal radiograph for further evaluation. Electronically Signed   By: Constance Holster M.D.   On: 02/13/2020 01:17        Scheduled Meds: . carvedilol  12.5 mg Oral BID  . furosemide  20 mg Intravenous Q12H  . heparin  2,700 Units Intravenous Once  . insulin aspart  0-15 Units Subcutaneous TID WC  . insulin aspart  0-5 Units Subcutaneous QHS  . isosorbide mononitrate  30 mg Oral Daily  . simvastatin  20 mg Oral q1800  . sodium chloride flush  3 mL Intravenous Once   Continuous Infusions: . heparin 1,200 Units/hr (02/13/20 0253)     LOS: 0 days     Cordelia Poche,  MD [facility] 02/13/2020, 3:10 PM  If 7PM-7AM, please contact night-coverage [email or web address]

## 2020-02-14 DIAGNOSIS — N189 Chronic kidney disease, unspecified: Secondary | ICD-10-CM

## 2020-02-14 DIAGNOSIS — I214 Non-ST elevation (NSTEMI) myocardial infarction: Principal | ICD-10-CM

## 2020-02-14 LAB — CBC
HCT: 28.3 % — ABNORMAL LOW (ref 39.0–52.0)
Hemoglobin: 9.4 g/dL — ABNORMAL LOW (ref 13.0–17.0)
MCH: 32.6 pg (ref 26.0–34.0)
MCHC: 33.2 g/dL (ref 30.0–36.0)
MCV: 98.3 fL (ref 80.0–100.0)
Platelets: 124 10*3/uL — ABNORMAL LOW (ref 150–400)
RBC: 2.88 MIL/uL — ABNORMAL LOW (ref 4.22–5.81)
RDW: 14.8 % (ref 11.5–15.5)
WBC: 7.8 10*3/uL (ref 4.0–10.5)
nRBC: 0 % (ref 0.0–0.2)

## 2020-02-14 LAB — BASIC METABOLIC PANEL
Anion gap: 6 (ref 5–15)
BUN: 44 mg/dL — ABNORMAL HIGH (ref 8–23)
CO2: 26 mmol/L (ref 22–32)
Calcium: 8 mg/dL — ABNORMAL LOW (ref 8.9–10.3)
Chloride: 104 mmol/L (ref 98–111)
Creatinine, Ser: 3.86 mg/dL — ABNORMAL HIGH (ref 0.61–1.24)
GFR calc Af Amer: 18 mL/min — ABNORMAL LOW (ref >60)
GFR calc non Af Amer: 15 mL/min — ABNORMAL LOW (ref >60)
Glucose, Bld: 144 mg/dL — ABNORMAL HIGH (ref 70–99)
Potassium: 4.7 mmol/L (ref 3.5–5.1)
Sodium: 136 mmol/L (ref 135–145)

## 2020-02-14 LAB — HEPARIN LEVEL (UNFRACTIONATED)
Heparin Unfractionated: 0.26 IU/mL — ABNORMAL LOW (ref 0.30–0.70)
Heparin Unfractionated: 0.37 IU/mL (ref 0.30–0.70)

## 2020-02-14 LAB — GLUCOSE, CAPILLARY
Glucose-Capillary: 137 mg/dL — ABNORMAL HIGH (ref 70–99)
Glucose-Capillary: 148 mg/dL — ABNORMAL HIGH (ref 70–99)
Glucose-Capillary: 123 mg/dL — ABNORMAL HIGH (ref 70–99)
Glucose-Capillary: 102 mg/dL — ABNORMAL HIGH (ref 70–99)

## 2020-02-14 MED ORDER — SODIUM CHLORIDE 0.9 % IV SOLN: INTRAVENOUS | Status: DC

## 2020-02-14 MED ORDER — HEPARIN BOLUS VIA INFUSION
1400.0000 [IU] | Freq: Once | INTRAVENOUS | Status: AC
  Administered 2020-02-14: 1400 [IU] via INTRAVENOUS
  Filled 2020-02-14: qty 1400

## 2020-02-14 MED ORDER — ISOSORBIDE MONONITRATE ER 60 MG PO TB24
60.0000 mg | ORAL_TABLET | Freq: Every day | ORAL | Status: DC
  Administered 2020-02-14 – 2020-02-18 (×4): 60 mg via ORAL
  Filled 2020-02-14: qty 2
  Filled 2020-02-14 (×2): qty 1
  Filled 2020-02-14: qty 2

## 2020-02-14 NOTE — Plan of Care (Signed)
  Problem: Education: Goal: Knowledge of General Education information will improve Description: Including pain rating scale, medication(s)/side effects and non-pharmacologic comfort measures Outcome: Progressing   Problem: Safety: Goal: Ability to remain free from injury will improve Outcome: Not Progressing  Refusing bed alarm. Pt is on an anticoagulant. Pt has urinary frequency and also the physician has ordered a diuretic.  Problem: Skin Integrity: Goal: Risk for impaired skin integrity will decrease Outcome: Not Progressing  Pt has pulled out 2 IV's with being on and anticoagulant. Easy to bleed

## 2020-02-14 NOTE — H&P (View-Only) (Signed)
Subjective:  Chest pain off / on last night Dyspnea improved  Objective:  Vitals:   02/13/20 1639 02/13/20 1953 02/13/20 2248 02/14/20 0321  BP: (!) 149/75 (!) 113/58 113/70 113/73  Pulse: 79 88 86 72  Resp: 18 17  19   Temp: 97.7 F (36.5 C) 97.8 F (36.6 C)  97.9 F (36.6 C)  TempSrc: Oral   Oral  SpO2: 97% 100% 97% 100%  Weight:    (!) 107.2 kg  Height:        Intake/Output from previous day:  Intake/Output Summary (Last 24 hours) at 02/14/2020 0941 Last data filed at 02/14/2020 0555 Gross per 24 hour  Intake 50.4 ml  Output 400 ml  Net -349.6 ml    Physical Exam:  Jovial Obese white male  HEENT: normal Neck post left CEA JVP normal no bruits no thyromegaly Lungs clear with no wheezing and good diaphragmatic motion Heart:  S1/S2 AS  murmur, no rub, gallop or click PMI normal Abdomen: benighn, BS positve, no tenderness, no AAA no bruit.  No HSM or HJR Distal pulses intact with no bruits Plus one bilateral  edema Neuro non-focal Skin warm and dry No muscular weakness   Lab Results: Basic Metabolic Panel: Recent Labs    02/13/20 0033 02/14/20 0540  NA 139 136  K 4.9 4.7  CL 105 104  CO2 26 26  GLUCOSE 156* 144*  BUN 33* 44*  CREATININE 3.22* 3.86*  CALCIUM 8.5* 8.0*   Liver Function Tests: Recent Labs    02/13/20 0033  AST 18  ALT 15  ALKPHOS 95  BILITOT 0.6  PROT 7.1  ALBUMIN 3.4*   Recent Labs    02/13/20 0033  LIPASE 25   CBC: Recent Labs    02/13/20 0033 02/14/20 0540  WBC 10.5 7.8  HGB 11.0* 9.4*  HCT 35.0* 28.3*  MCV 99.7 98.3  PLT 153 124*   Hemoglobin A1C: Recent Labs    02/13/20 0525  HGBA1C 5.7*   Fasting Lipid Panel: Recent Labs    02/13/20 0525  CHOL 122  HDL 33*  LDLCALC 56  TRIG 163*  CHOLHDL 3.7    Imaging: DG Chest 2 View  Result Date: 02/13/2020 CLINICAL DATA:  Chest pain. EXAM: CHEST - 2 VIEW COMPARISON:  CT dated July 01, 2019. FINDINGS: There are coarse interstitial lung markings  bilaterally with hazy bibasilar airspace opacities which appear to be new since 2015 chest x-ray and the patient's recent CT. The heart size is mildly enlarged but stable from prior study. The patient is status post prior CABG. There is no pneumothorax. There are dilated loops of small bowel in the upper abdomen. There are small bilateral pleural effusions. IMPRESSION: 1. Prominent interstitial lung markings with new small bilateral pleural effusions raises concern for developing congestive heart failure. An underlying atypical infectious process is not excluded. 2. Dilated loops of bowel in the upper abdomen consider a dedicated abdominal radiograph for further evaluation. Electronically Signed   By: Constance Holster M.D.   On: 02/13/2020 01:17   DG Sacrum/Coccyx  Result Date: 02/13/2020 CLINICAL DATA:  Fall several months ago injuring tailbone. Persistent pain. EXAM: SACRUM AND COCCYX - 2+ VIEW COMPARISON:  12/20/2011 FINDINGS: No evidence of acute fracture. Mild posterior angulation of the distal aspect of the coccyx unchanged. There are degenerative changes of the spine. Stents over the iliac and femoral arteries bilaterally. IMPRESSION: No acute findings. Electronically Signed   By: Marin Olp M.D.   On:  02/13/2020 16:16    Cardiac Studies:  ECG: SR LVH poor R wave progression no acute changes    Telemetry:  NSR 02/14/2020   Echo: pending TTE done 01/11/20 with EF 50-55% moderate AS mean gradient 26 AVA 1.06 DVI 0.25   Medications:   . carvedilol  12.5 mg Oral BID  . insulin aspart  0-15 Units Subcutaneous TID WC  . insulin aspart  0-5 Units Subcutaneous QHS  . isosorbide mononitrate  60 mg Oral Daily  . simvastatin  20 mg Oral q1800     . heparin 1,500 Units/hr (02/13/20 2049)    Assessment/Plan:   1. CAD:  Distant CABG 2012 with increasing angina continue heparin and beta blocker nitrates increased troponin elevated 403.  Despite CRF think he needs right and left heart cath and  may be headed toward dialysis with stent intervention to coronary arteries / plus/minus TAVR or redo CABG/AVR.    2. CRF:  Volume overload with dyspnea and edema BNP 713 diuretic held this am due to azotemia Cr up to 3.86 this am and lasix held He understands he will likely need dialysis but doesn't want to have his heart "ruined" waiting for it Would have nephrology see today pre cath   3. CEA:  Left repeat cartotids pending  4. AS: repeat echo pending also for EF given angina and elevated troponin AS more moderate to severe last echo with DVI 0.25 and AVA right around 1 cm2     Jenkins Rouge 02/14/2020, 9:41 AM

## 2020-02-14 NOTE — Progress Notes (Signed)
ANTICOAGULATION CONSULT NOTE -   Pharmacy Consult for Heparin Indication: chest pain/ACS  Allergies  Allergen Reactions  . Contrast Media [Iodinated Diagnostic Agents] Rash and Other (See Comments)    Got very hot and red   . Glipizide Other (See Comments)    ANTIDIABETICS. Burning  . Metrizamide Other (See Comments)    Got very hot and red  . Penicillins Hives and Swelling    Has patient had a PCN reaction causing immediate rash, facial/tongue/throat swelling, SOB or lightheadedness with hypotension: yes Has patient had a PCN reaction causing severe rash involving mucus membranes or skin necrosis: no  Has patient had a PCN reaction that required hospitalization: yes Has patient had a PCN reaction occurring within the last 10 years: no If all of the above answers are "NO", then may proceed with Cephalosporin use.     Patient Measurements: Height: 5\' 7"  (170.2 cm) Weight: (!) 107.7 kg (237 lb 8 oz) IBW/kg (Calculated) : 66.1 HEPARIN DW (KG): 90.2  Vital Signs: Temp: 97.8 F (36.6 C) (07/24 1953) Temp Source: Oral (07/24 1639) BP: 113/70 (07/24 2248) Pulse Rate: 86 (07/24 2248)  Labs: Recent Labs    02/13/20 0033 02/13/20 0302 02/13/20 0525 02/13/20 1326 02/13/20 2320  HGB 11.0*  --   --   --   --   HCT 35.0*  --   --   --   --   PLT 153  --   --   --   --   APTT  --   --  76*  --   --   LABPROT 12.9  --   --   --   --   INR 1.0  --   --   --   --   HEPARINUNFRC  --   --   --  0.10* 0.36  CREATININE 3.22*  --   --   --   --   TROPONINIHS 403* 470*  --   --   --     Estimated Creatinine Clearance: 26.4 mL/min (A) (by C-G formula based on SCr of 3.22 mg/dL (H)).   Medical History: Past Medical History:  Diagnosis Date  . Adenomatous colon polyp   . Allergy   . Angina at rest [facility])    chronic  . Anxiety   . Arthritis    RA  . Bell's palsy 2007  . Carotid artery occlusion   . Cataract    Dr. Dawna Part  . Coronary artery disease   . Depression   .  Diabetes mellitus   . Diverticulosis   . Duodenitis   . DVT (deep venous thrombosis) ([facility])    in leg  . Dyspnea    DOE  . Gastropathy   . GERD (gastroesophageal reflux disease)   . Heart murmur   . Helicobacter pylori gastritis   . Hyperlipidemia   . Hypertension   . Kidney failure   . Mild aortic stenosis   . Neuropathy   . Obesity   . Peripheral vascular disease ([place])   . Post splenectomy syndrome   . Psoriasis   . Psoriatic arthritis ([place])   . Renal disorder    stage 4 kidney failure  . Sleep apnea    uses cpap  . SOB (shortness of breath)   . Stroke ([place])   . Tobacco use disorder    recently quit  . Tubular adenoma 08/2013   Dr. Hilarie Fredrickson  . Weak urinary stream     Medications:  Medications  Prior to Admission  Medication Sig Dispense Refill Last Dose  . albuterol (PROAIR HFA) 108 (90 Base) MCG/ACT inhaler Inhale 2 puffs into the lungs every 6 (six) hours as needed for shortness of breath. 18 g 3 Unknown at PRN  . aspirin 81 MG tablet Take 81 mg by mouth daily.     [name] buPROPion (WELLBUTRIN XL) 150 MG 24 hr tablet Take 1 tablet (150 mg total) by mouth daily. 90 tablet 3 12-24 hours at Unknown  . busPIRone (BUSPAR) 10 MG tablet TAKE 2 TABLETS(20 MG) BY MOUTH TWICE DAILY (Patient taking differently: Take 20 mg by mouth 2 (two) times daily. ) 360 tablet 3 12-24 hours at Unknown  . calcitRIOL (ROCALTROL) 0.25 MCG capsule Take 0.25 mcg by mouth See admin instructions. Take 1 capsule (0.14mcg) once weekly on Monday then Wednesday then Friday   As directed at Unknown  . carvedilol (COREG) 12.5 MG tablet Take 1 tablet (12.5 mg total) by mouth 2 (two) times daily. 180 tablet 1 12-24 hours at Unknown  . clopidogrel (PLAVIX) 75 MG tablet TAKE 1 TABLET(75 MG) BY MOUTH DAILY (Patient taking differently: Take 75 mg by mouth daily. ) 90 tablet 3 12-24 hours at Unknown  . cyclobenzaprine (FLEXERIL) 10 MG tablet Take 10 mg by mouth 3 (three) times daily as needed for muscle spasms.    Unknown  at PRN  . Dextran 70-Hypromellose (ARTIFICIAL TEARS) 0.1-0.3 % SOLN Place 1 drop into both eyes 4 (four) times daily as needed (dry eyes).   Unknown at PRN  . DULoxetine (CYMBALTA) 60 MG capsule Take 1 capsule (60 mg total) by mouth daily. 90 capsule 3 12-24 hours at Unknown  . finasteride (PROSCAR) 5 MG tablet Take 5 mg by mouth daily.   12-24 hours at Unknown  . furosemide (LASIX) 20 MG tablet Take 1 tablet (20 mg total) by mouth daily. 90 tablet 3 12-24 hours at Unknown  . losartan (COZAAR) 100 MG tablet TAKE 1 TABLET(100 MG) BY MOUTH DAILY (Patient taking differently: Take 100 mg by mouth daily. ) 30 tablet 2 12-24 hours at Unknown  . Multiple Vitamin (MULTIVITAMIN) capsule Take 1 capsule by mouth daily.      . nitroGLYCERIN (NITROSTAT) 0.4 MG SL tablet Place 1 tablet (0.4 mg total) under the tongue every 5 (five) minutes as needed. 25 tablet 1 Unknown at PRN  . nystatin (MYCOSTATIN/NYSTOP) powder Apply 1 application topically 3 (three) times daily. Substitute other antifungal powder as needed if insurance issues 60 g 2 Unknown at PRN  . oxyCODONE-acetaminophen (PERCOCET) 5-325 MG tablet Take 1 tablet by mouth every 4 (four) hours as needed for severe pain. 20 tablet 0 Unknown at PRN  . sertraline (ZOLOFT) 100 MG tablet Take 2 tablets (200 mg total) by mouth daily. 180 tablet 3 12-24 hours at Unknown  . simvastatin (ZOCOR) 20 MG tablet TAKE 1 TABLET(20 MG) BY MOUTH DAILY AT 6 PM (Patient taking differently: Take 20 mg by mouth every evening. ) 90 tablet 2 12-24 hours at Unknown  . tamsulosin (FLOMAX) 0.4 MG CAPS capsule Take 1 capsule by mouth daily (Patient taking differently: Take 0.4 mg by mouth daily. ) 90 capsule 3 12-24 hours at Unknown    Assessment: Heparin initiated for ACS.  No anticoagulants PTA per med list.  Baseline labs ordered.   7/24 Heparin 4000 units bolus x 1 then infusion at 1200 units/hr  Goal of Therapy:  Heparin level 0.3-0.7 units/ml Monitor platelets by  anticoagulation protocol: Yes  Plan:  7/24 @2320  HL= 0.36, therapeutic x 1. Will continue infusion at 1500 units/hr Check HL in 8 hours to confirm  Hart Robinsons A 02/14/2020,12:13 AM

## 2020-02-14 NOTE — Progress Notes (Signed)
PROGRESS NOTE    [name]  PJA:[id number] DOB: 1956-05-29 DOA: 02/13/2020 PCP: [name], MD   Brief Narrative: [name] [name] is a 64 y.o. male with medical history significant for CAD status post CABG, CKD 4, diabetes. Patient presented secondary to chest pain and shortness of breath with evidence of NSTEMI on admission. Heparin drip started. Cardiology consulted.    Assessment & Plan:   Principal Problem:   NSTEMI (non-ST elevated myocardial infarction) ([place]) Active Problems:   Coronary artery disease   Type 2 diabetes mellitus with diabetic peripheral angiopathy without gangrene ([place])   CKD stage 4 due to type 2 diabetes mellitus (HCC)   Moderate aortic valve stenosis   Acute diastolic CHF (congestive heart failure) ([place])   Hypertensive urgency   NSTEMI CAD s/p CABG Patient now chest pain free. Complicated by CKD stage IV. Cardiology consulted. -Cardiology recommendations: Continue heparin drip, Transthoracic Echocardiogram, aspirin, Imdur, anticipate probable heart catheterization; if heart cath, will need to consult nephrology in setting of CKD stage IV   Moderate aortic stenosis Slightly worsened from previous per recent cardiology note however still moderate. Transthoracic Echocardiogram ordered as mentioned above  Essential hypertension BP is slightly soft and had an episode of hypotension yesterday -Cardiology recommendations: Imdur, Coreg -May need to adjust BP medication. Lasix discontinued as mentioned below  Back pain Related to fall suffered some months ago. Tenderness over sacrum. No fracture on x-ray.  Chronic diastolic heart failure -Discontinue Lasix as mentioned below -Continue Coreg  CKD Stage IV Worsened. Possibly in setting of Lasix use but was also hypotensive yesterday. -Hold Lasix  Hyperlipidemia -Continue simvastatin  Anemia of chronic disease In setting of CKD. Mild. Hemoglobin stable.   DVT prophylaxis: Heparin  drip Code Status:   Code Status: Full Code Family Communication: Grandson at bedside Disposition Plan: Discharge pending cardiology recommendations for management of NSTEMI   Consultants:   Cardiology  Procedures:   None  Antimicrobials:  None    Subjective: Recurrent substernal chest pain overnight that resolved spontaneously. Did not sleep well, but states he just never sleeps well in the hospital. No other issues overnight.  Objective: Vitals:   02/13/20 1639 02/13/20 1953 02/13/20 2248 02/14/20 0321  BP: (!) 149/75 (!) 113/58 113/70 113/73  Pulse: 79 88 86 72  Resp: 18 17  19   Temp: 97.7 F (36.5 C) 97.8 F (36.6 C)  97.9 F (36.6 C)  TempSrc: Oral   Oral  SpO2: 97% 100% 97% 100%  Weight:    (!) 107.2 kg  Height:        Intake/Output Summary (Last 24 hours) at 02/14/2020 0756 Last data filed at 02/14/2020 0555 Gross per 24 hour  Intake 50.4 ml  Output 400 ml  Net -349.6 ml   Filed Weights   02/13/20 0034 02/13/20 1307 02/14/20 0321  Weight: (!) 99.8 kg (!) 107.7 kg (!) 107.2 kg    Examination:  General exam: Appears calm and comfortable Respiratory system: Rales at bases, diminished. Respiratory effort normal. Cardiovascular system: S1 & S2 heard, RRR. Gastrointestinal system: Abdomen is nondistended, soft and nontender. No organomegaly or masses felt. Normal bowel sounds heard. Central nervous system: Alert and oriented. No focal neurological deficits. Musculoskeletal: 2+ BLE  edema. No calf tenderness Skin: No cyanosis. No rashes Psychiatry: Judgement and insight appear normal. Mood & affect appropriate.      Data Reviewed: I have personally reviewed following labs and imaging studies  CBC Lab Results  Component Value Date  WBC 7.8 02/14/2020   RBC 2.88 (L) 02/14/2020   HGB 9.4 (L) 02/14/2020   HCT 28.3 (L) 02/14/2020   MCV 98.3 02/14/2020   MCH 32.6 02/14/2020   PLT 124 (L) 02/14/2020   MCHC 33.2 02/14/2020   RDW 14.8 02/14/2020    LYMPHSABS 2.1 03/15/2017   MONOABS 0.4 03/15/2017   EOSABS 0.2 03/15/2017   BASOSABS 0.1 64/40/3474     Last metabolic panel Lab Results  Component Value Date   NA 136 02/14/2020   K 4.7 02/14/2020   CL 104 02/14/2020   CO2 26 02/14/2020   BUN 44 (H) 02/14/2020   CREATININE 3.86 (H) 02/14/2020   GLUCOSE 144 (H) 02/14/2020   GFRNONAA 15 (L) 02/14/2020   GFRAA 18 (L) 02/14/2020   CALCIUM 8.0 (L) 02/14/2020   PHOS 4.5 04/07/2019   PROT 7.1 02/13/2020   ALBUMIN 3.4 (L) 02/13/2020   BILITOT 0.6 02/13/2020   ALKPHOS 95 02/13/2020   AST 18 02/13/2020   ALT 15 02/13/2020   ANIONGAP 6 02/14/2020    CBG (last 3)  Recent Labs    02/13/20 1218 02/13/20 1843 02/13/20 2033  GLUCAP 155* 99 131*     GFR: Estimated Creatinine Clearance: 22 mL/min (A) (by C-G formula based on SCr of 3.86 mg/dL (H)).  Coagulation Profile: Recent Labs  Lab 02/13/20 0033  INR 1.0    Recent Results (from the past 240 hour(s))  SARS Coronavirus 2 by RT PCR (hospital order, performed in [facility] hospital lab) Nasopharyngeal Nasopharyngeal Swab     Status: None   Collection Time: 02/13/20  3:01 AM   Specimen: Nasopharyngeal Swab  Result Value Ref Range Status   SARS Coronavirus 2 NEGATIVE NEGATIVE Final    Comment: (NOTE) SARS-CoV-2 target nucleic acids are NOT DETECTED.  The SARS-CoV-2 RNA is generally detectable in upper and lower respiratory specimens during the acute phase of infection. The lowest concentration of SARS-CoV-2 viral copies this assay can detect is 250 copies / mL. A negative result does not preclude SARS-CoV-2 infection and should not be used as the sole basis for treatment or other patient management decisions.  A negative result may occur with improper specimen collection / handling, submission of specimen other than nasopharyngeal swab, presence of viral mutation(s) within the areas targeted by this assay, and inadequate number of viral copies (<250 copies / mL). A  negative result must be combined with clinical observations, patient history, and epidemiological information.  Fact Sheet for Patients:   StrictlyIdeas.no  Fact Sheet for Healthcare Providers: BankingDealers.co.za  This test is not yet approved or  cleared by the [place] FDA and has been authorized for detection and/or diagnosis of SARS-CoV-2 by FDA under an Emergency Use Authorization (EUA).  This EUA will remain in effect (meaning this test can be used) for the duration of the COVID-19 declaration under Section 564(b)(1) of the Act, 21 U.S.C. section 360bbb-3(b)(1), unless the authorization is terminated or revoked sooner.  Performed at [facility], [address], [place], [place] [address]         Radiology Studies: DG Chest 2 View  Result Date: 02/13/2020 CLINICAL DATA:  Chest pain. EXAM: CHEST - 2 VIEW COMPARISON:  CT dated July 01, 2019. FINDINGS: There are coarse interstitial lung markings bilaterally with hazy bibasilar airspace opacities which appear to be new since 2015 chest x-ray and the patient's recent CT. The heart size is mildly enlarged but stable from prior study. The patient is status post prior CABG.  There is no pneumothorax. There are dilated loops of small bowel in the upper abdomen. There are small bilateral pleural effusions. IMPRESSION: 1. Prominent interstitial lung markings with new small bilateral pleural effusions raises concern for developing congestive heart failure. An underlying atypical infectious process is not excluded. 2. Dilated loops of bowel in the upper abdomen consider a dedicated abdominal radiograph for further evaluation. Electronically Signed   By: Constance Holster M.D.   On: 02/13/2020 01:17   DG Sacrum/Coccyx  Result Date: 02/13/2020 CLINICAL DATA:  Fall several months ago injuring tailbone. Persistent pain. EXAM: SACRUM AND COCCYX - 2+ VIEW COMPARISON:  12/20/2011  FINDINGS: No evidence of acute fracture. Mild posterior angulation of the distal aspect of the coccyx unchanged. There are degenerative changes of the spine. Stents over the iliac and femoral arteries bilaterally. IMPRESSION: No acute findings. Electronically Signed   By: Marin Olp M.D.   On: 02/13/2020 16:16        Scheduled Meds: . carvedilol  12.5 mg Oral BID  . insulin aspart  0-15 Units Subcutaneous TID WC  . insulin aspart  0-5 Units Subcutaneous QHS  . isosorbide mononitrate  30 mg Oral Daily  . simvastatin  20 mg Oral q1800   Continuous Infusions: . heparin 1,500 Units/hr (02/13/20 2049)     LOS: 1 day     Cordelia Poche, MD [facility] 02/14/2020, 7:56 AM  If 7PM-7AM, please contact night-coverage [email or web address]

## 2020-02-14 NOTE — Progress Notes (Signed)
ANTICOAGULATION CONSULT NOTE -   Pharmacy Consult for Heparin Indication: chest pain/ACS  Allergies  Allergen Reactions  . Contrast Media [Iodinated Diagnostic Agents] Rash and Other (See Comments)    Got very hot and red   . Glipizide Other (See Comments)    ANTIDIABETICS. Burning  . Metrizamide Other (See Comments)    Got very hot and red  . Penicillins Hives and Swelling    Has patient had a PCN reaction causing immediate rash, facial/tongue/throat swelling, SOB or lightheadedness with hypotension: yes Has patient had a PCN reaction causing severe rash involving mucus membranes or skin necrosis: no  Has patient had a PCN reaction that required hospitalization: yes Has patient had a PCN reaction occurring within the last 10 years: no If all of the above answers are "NO", then may proceed with Cephalosporin use.     Patient Measurements: Height: 5\' 7"  (170.2 cm) Weight: (!) 107.2 kg (236 lb 5.3 oz) IBW/kg (Calculated) : 66.1 HEPARIN DW (KG): 90.2  Vital Signs: Temp: 97.9 F (36.6 C) (07/25 0321) Temp Source: Oral (07/25 0321) BP: 113/73 (07/25 0321) Pulse Rate: 72 (07/25 0321)  Labs: Recent Labs    02/13/20 0033 02/13/20 0302 02/13/20 0525 02/13/20 1326 02/13/20 2320 02/14/20 0540 02/14/20 0950  HGB 11.0*  --   --   --   --  9.4*  --   HCT 35.0*  --   --   --   --  28.3*  --   PLT 153  --   --   --   --  124*  --   APTT  --   --  76*  --   --   --   --   LABPROT 12.9  --   --   --   --   --   --   INR 1.0  --   --   --   --   --   --   HEPARINUNFRC  --   --   --  0.10* 0.36  --  0.26*  CREATININE 3.22*  --   --   --   --  3.86*  --   TROPONINIHS 403* 470*  --   --   --   --   --     Estimated Creatinine Clearance: 22 mL/min (A) (by C-G formula based on SCr of 3.86 mg/dL (H)).   Medical History: Past Medical History:  Diagnosis Date  . Adenomatous colon polyp   . Allergy   . Angina at rest [facility])    chronic  . Anxiety   . Arthritis    RA  . Bell's  palsy 2007  . Carotid artery occlusion   . Cataract    Dr. Dawna Part  . Coronary artery disease   . Depression   . Diabetes mellitus   . Diverticulosis   . Duodenitis   . DVT (deep venous thrombosis) (HCC)    in leg  . Dyspnea    DOE  . Gastropathy   . GERD (gastroesophageal reflux disease)   . Heart murmur   . Helicobacter pylori gastritis   . Hyperlipidemia   . Hypertension   . Kidney failure   . Mild aortic stenosis   . Neuropathy   . Obesity   . Peripheral vascular disease ([place])   . Post splenectomy syndrome   . Psoriasis   . Psoriatic arthritis ([place])   . Renal disorder    stage 4 kidney failure  . Sleep apnea  uses cpap  . SOB (shortness of breath)   . Stroke ([place])   . Tobacco use disorder    recently quit  . Tubular adenoma 08/2013   Dr. Hilarie Fredrickson  . Weak urinary stream     Medications:  Medications Prior to Admission  Medication Sig Dispense Refill Last Dose  . albuterol (PROAIR HFA) 108 (90 Base) MCG/ACT inhaler Inhale 2 puffs into the lungs every 6 (six) hours as needed for shortness of breath. 18 g 3 Unknown at PRN  . aspirin 81 MG tablet Take 81 mg by mouth daily.     [name] buPROPion (WELLBUTRIN XL) 150 MG 24 hr tablet Take 1 tablet (150 mg total) by mouth daily. 90 tablet 3 12-24 hours at Unknown  . busPIRone (BUSPAR) 10 MG tablet TAKE 2 TABLETS(20 MG) BY MOUTH TWICE DAILY (Patient taking differently: Take 20 mg by mouth 2 (two) times daily. ) 360 tablet 3 12-24 hours at Unknown  . calcitRIOL (ROCALTROL) 0.25 MCG capsule Take 0.25 mcg by mouth See admin instructions. Take 1 capsule (0.6mcg) once weekly on Monday then Wednesday then Friday   As directed at Unknown  . carvedilol (COREG) 12.5 MG tablet Take 1 tablet (12.5 mg total) by mouth 2 (two) times daily. 180 tablet 1 12-24 hours at Unknown  . clopidogrel (PLAVIX) 75 MG tablet TAKE 1 TABLET(75 MG) BY MOUTH DAILY (Patient taking differently: Take 75 mg by mouth daily. ) 90 tablet 3 12-24 hours at Unknown  .  cyclobenzaprine (FLEXERIL) 10 MG tablet Take 10 mg by mouth 3 (three) times daily as needed for muscle spasms.    Unknown at PRN  . Dextran 70-Hypromellose (ARTIFICIAL TEARS) 0.1-0.3 % SOLN Place 1 drop into both eyes 4 (four) times daily as needed (dry eyes).   Unknown at PRN  . DULoxetine (CYMBALTA) 60 MG capsule Take 1 capsule (60 mg total) by mouth daily. 90 capsule 3 12-24 hours at Unknown  . finasteride (PROSCAR) 5 MG tablet Take 5 mg by mouth daily.   12-24 hours at Unknown  . furosemide (LASIX) 20 MG tablet Take 1 tablet (20 mg total) by mouth daily. 90 tablet 3 12-24 hours at Unknown  . losartan (COZAAR) 100 MG tablet TAKE 1 TABLET(100 MG) BY MOUTH DAILY (Patient taking differently: Take 100 mg by mouth daily. ) 30 tablet 2 12-24 hours at Unknown  . Multiple Vitamin (MULTIVITAMIN) capsule Take 1 capsule by mouth daily.      . nitroGLYCERIN (NITROSTAT) 0.4 MG SL tablet Place 1 tablet (0.4 mg total) under the tongue every 5 (five) minutes as needed. 25 tablet 1 Unknown at PRN  . nystatin (MYCOSTATIN/NYSTOP) powder Apply 1 application topically 3 (three) times daily. Substitute other antifungal powder as needed if insurance issues 60 g 2 Unknown at PRN  . oxyCODONE-acetaminophen (PERCOCET) 5-325 MG tablet Take 1 tablet by mouth every 4 (four) hours as needed for severe pain. 20 tablet 0 Unknown at PRN  . sertraline (ZOLOFT) 100 MG tablet Take 2 tablets (200 mg total) by mouth daily. 180 tablet 3 12-24 hours at Unknown  . simvastatin (ZOCOR) 20 MG tablet TAKE 1 TABLET(20 MG) BY MOUTH DAILY AT 6 PM (Patient taking differently: Take 20 mg by mouth every evening. ) 90 tablet 2 12-24 hours at Unknown  . tamsulosin (FLOMAX) 0.4 MG CAPS capsule Take 1 capsule by mouth daily (Patient taking differently: Take 0.4 mg by mouth daily. ) 90 capsule 3 12-24 hours at Unknown    Assessment: Heparin initiated  for ACS.  No anticoagulants PTA per med list.  Baseline labs ordered.   7/24 Heparin 4000 units bolus  x 1 then infusion at 1200 units/hr 7/24 @2320  HL= 0.36, therapeutic x 1. Will continue infusion at 1500 units/hr  Goal of Therapy:  Heparin level 0.3-0.7 units/ml Monitor platelets by anticoagulation protocol: Yes   Plan:  7/25 note: (pt pulled IV out around 0300 per nurse, heparin had to be restarted) 7/25 @0950  HL= 0.26, subtherapeutic. Will order heparin bolus of 1400 units x1 and increase infusion to 1700 units/hr.  hgb 9.4  plt 124 Check HL in 8 hours to confirm (crcl 22)  Leron Stoffers A 02/14/2020,10:38 AM

## 2020-02-14 NOTE — Progress Notes (Signed)
ANTICOAGULATION CONSULT NOTE -   Pharmacy Consult for Heparin Indication: chest pain/ACS  Allergies  Allergen Reactions  . Contrast Media [Iodinated Diagnostic Agents] Rash and Other (See Comments)    Got very hot and red   . Glipizide Other (See Comments)    ANTIDIABETICS. Burning  . Metrizamide Other (See Comments)    Got very hot and red  . Penicillins Hives and Swelling    Has patient had a PCN reaction causing immediate rash, facial/tongue/throat swelling, SOB or lightheadedness with hypotension: yes Has patient had a PCN reaction causing severe rash involving mucus membranes or skin necrosis: no  Has patient had a PCN reaction that required hospitalization: yes Has patient had a PCN reaction occurring within the last 10 years: no If all of the above answers are "NO", then may proceed with Cephalosporin use.     Patient Measurements: Height: 5\' 7"  (170.2 cm) Weight: (!) 107.2 kg (236 lb 5.3 oz) IBW/kg (Calculated) : 66.1 HEPARIN DW (KG): 90.2  Vital Signs: Temp: 98.7 F (37.1 C) (07/25 1951) Temp Source: Oral (07/25 1951) BP: 152/63 (07/25 1951) Pulse Rate: 80 (07/25 1951)  Labs: Recent Labs    02/13/20 0033 02/13/20 0302 02/13/20 0525 02/13/20 1326 02/13/20 2320 02/14/20 0540 02/14/20 0950 02/14/20 1856  HGB 11.0*  --   --   --   --  9.4*  --   --   HCT 35.0*  --   --   --   --  28.3*  --   --   PLT 153  --   --   --   --  124*  --   --   APTT  --   --  76*  --   --   --   --   --   LABPROT 12.9  --   --   --   --   --   --   --   INR 1.0  --   --   --   --   --   --   --   HEPARINUNFRC  --   --   --    < > 0.36  --  0.26* 0.37  CREATININE 3.22*  --   --   --   --  3.86*  --   --   TROPONINIHS 403* 470*  --   --   --   --   --   --    < > = values in this interval not displayed.    Estimated Creatinine Clearance: 22 mL/min (A) (by C-G formula based on SCr of 3.86 mg/dL (H)).   Medical History: Past Medical History:  Diagnosis Date  . Adenomatous  colon polyp   . Allergy   . Angina at rest [facility])    chronic  . Anxiety   . Arthritis    RA  . Bell's palsy 2007  . Carotid artery occlusion   . Cataract    Dr. Dawna Part  . Coronary artery disease   . Depression   . Diabetes mellitus   . Diverticulosis   . Duodenitis   . DVT (deep venous thrombosis) (HCC)    in leg  . Dyspnea    DOE  . Gastropathy   . GERD (gastroesophageal reflux disease)   . Heart murmur   . Helicobacter pylori gastritis   . Hyperlipidemia   . Hypertension   . Kidney failure   . Mild aortic stenosis   . Neuropathy   .  Obesity   . Peripheral vascular disease ([place])   . Post splenectomy syndrome   . Psoriasis   . Psoriatic arthritis ([place])   . Renal disorder    stage 4 kidney failure  . Sleep apnea    uses cpap  . SOB (shortness of breath)   . Stroke ([place])   . Tobacco use disorder    recently quit  . Tubular adenoma 08/2013   Dr. Hilarie Fredrickson  . Weak urinary stream     Medications:  Medications Prior to Admission  Medication Sig Dispense Refill Last Dose  . albuterol (PROAIR HFA) 108 (90 Base) MCG/ACT inhaler Inhale 2 puffs into the lungs every 6 (six) hours as needed for shortness of breath. 18 g 3 Unknown at PRN  . aspirin 81 MG tablet Take 81 mg by mouth daily.     [name] buPROPion (WELLBUTRIN XL) 150 MG 24 hr tablet Take 1 tablet (150 mg total) by mouth daily. 90 tablet 3 12-24 hours at Unknown  . busPIRone (BUSPAR) 10 MG tablet TAKE 2 TABLETS(20 MG) BY MOUTH TWICE DAILY (Patient taking differently: Take 20 mg by mouth 2 (two) times daily. ) 360 tablet 3 12-24 hours at Unknown  . calcitRIOL (ROCALTROL) 0.25 MCG capsule Take 0.25 mcg by mouth See admin instructions. Take 1 capsule (0.93mcg) once weekly on Monday then Wednesday then Friday   As directed at Unknown  . carvedilol (COREG) 12.5 MG tablet Take 1 tablet (12.5 mg total) by mouth 2 (two) times daily. 180 tablet 1 12-24 hours at Unknown  . clopidogrel (PLAVIX) 75 MG tablet TAKE 1 TABLET(75 MG) BY  MOUTH DAILY (Patient taking differently: Take 75 mg by mouth daily. ) 90 tablet 3 12-24 hours at Unknown  . cyclobenzaprine (FLEXERIL) 10 MG tablet Take 10 mg by mouth 3 (three) times daily as needed for muscle spasms.    Unknown at PRN  . Dextran 70-Hypromellose (ARTIFICIAL TEARS) 0.1-0.3 % SOLN Place 1 drop into both eyes 4 (four) times daily as needed (dry eyes).   Unknown at PRN  . DULoxetine (CYMBALTA) 60 MG capsule Take 1 capsule (60 mg total) by mouth daily. 90 capsule 3 12-24 hours at Unknown  . finasteride (PROSCAR) 5 MG tablet Take 5 mg by mouth daily.   12-24 hours at Unknown  . furosemide (LASIX) 20 MG tablet Take 1 tablet (20 mg total) by mouth daily. 90 tablet 3 12-24 hours at Unknown  . losartan (COZAAR) 100 MG tablet TAKE 1 TABLET(100 MG) BY MOUTH DAILY (Patient taking differently: Take 100 mg by mouth daily. ) 30 tablet 2 12-24 hours at Unknown  . Multiple Vitamin (MULTIVITAMIN) capsule Take 1 capsule by mouth daily.      . nitroGLYCERIN (NITROSTAT) 0.4 MG SL tablet Place 1 tablet (0.4 mg total) under the tongue every 5 (five) minutes as needed. 25 tablet 1 Unknown at PRN  . nystatin (MYCOSTATIN/NYSTOP) powder Apply 1 application topically 3 (three) times daily. Substitute other antifungal powder as needed if insurance issues 60 g 2 Unknown at PRN  . oxyCODONE-acetaminophen (PERCOCET) 5-325 MG tablet Take 1 tablet by mouth every 4 (four) hours as needed for severe pain. 20 tablet 0 Unknown at PRN  . sertraline (ZOLOFT) 100 MG tablet Take 2 tablets (200 mg total) by mouth daily. 180 tablet 3 12-24 hours at Unknown  . simvastatin (ZOCOR) 20 MG tablet TAKE 1 TABLET(20 MG) BY MOUTH DAILY AT 6 PM (Patient taking differently: Take 20 mg by mouth every evening. ) 90  tablet 2 12-24 hours at Unknown  . tamsulosin (FLOMAX) 0.4 MG CAPS capsule Take 1 capsule by mouth daily (Patient taking differently: Take 0.4 mg by mouth daily. ) 90 capsule 3 12-24 hours at Unknown    Assessment: Heparin  initiated for ACS.  No anticoagulants PTA per med list.  Baseline labs ordered.   7/24 Heparin 4000 units bolus x 1 then infusion at 1200 units/hr 7/24 @2320  HL= 0.36, therapeutic x 1. Will continue infusion at 1500 units/hr 7/25 note: (pt pulled IV out around 0300 per nurse, heparin had to be restarted) 7/25 @0950  HL= 0.26, subtherapeutic. Will order heparin bolus of 1400 units x1 and increase infusion to 1700 units/hr.  hgb 9.4  plt 124  0725 1856 HL 0.37 - therapeutic - continue current rate   Goal of Therapy:  Heparin level 0.3-0.7 units/ml Monitor platelets by anticoagulation protocol: Yes   Plan:  HL 0.37 - continue current rate of 1700 units/hr and recheck confirmatory level in 8 hours  CBC daily  Lu Duffel, PharmD, BCPS Clinical Pharmacist 02/14/2020 8:21 PM

## 2020-02-14 NOTE — Progress Notes (Signed)
[facility]  ROUNDING NOTE   Subjective:  Patient well-known to [place] as we follow him for chronic kidney disease stage IV. Comes in with angina and chest pain. Troponin was elevated at 403. Cardiology has seen the patient and recommending right and left heart catheterization.   Patient also appears to be chronically volume overloaded. Most recent ejection fraction was 50 to 55%.  Objective:  Vital signs in last 24 hours:  Temp:  [97.8 F (36.6 C)-98.4 F (36.9 C)] 98.4 F (36.9 C) (07/25 1555) Pulse Rate:  [72-88] 79 (07/25 1555) Resp:  [17-19] 17 (07/25 1555) BP: (113-127)/(58-73) 120/70 (07/25 1555) SpO2:  [94 %-100 %] 100 % (07/25 1555) Weight:  [107.2 kg] 107.2 kg (07/25 0321)  Weight change: 7.938 kg Filed Weights   02/13/20 0034 02/13/20 1307 02/14/20 0321  Weight: (!) 99.8 kg (!) 107.7 kg (!) 107.2 kg    Intake/Output: I/O last 3 completed shifts: In: 262.4 [P.O.:120; I.V.:142.4] Out: 400 [Urine:400]   Intake/Output this shift:  No intake/output data recorded.  Physical Exam: General: No acute distress  Head: Normocephalic, atraumatic. Moist oral mucosal membranes  Eyes: Anicteric  Neck: Supple, trachea midline  Lungs:  Diminished at bases  Heart: Q0G8 2/6 systolic ejection murmur  Abdomen:  Soft, nontender, bowel sounds present  Extremities: 3+ peripheral edema.  Neurologic: Awake, alert, following commands  Skin: No lesions       Basic Metabolic Panel: Recent Labs  Lab 02/08/20 1107 02/13/20 0033 02/14/20 0540  NA 138 139 136  K 5.1 4.9 4.7  CL 104 105 104  CO2 _0 GLUCOSE 112* 156* 144*  BUN 39* 33* 44*  CREATININE 3.17* 3.22* 3.86*  CALCIUM 9.3 8.5* 8.0*    Liver Function Tests: Recent Labs  Lab 02/08/20 1107 02/13/20 0033  AST 16 18  ALT 19 15  ALKPHOS 84 95  BILITOT 0.7 0.6  PROT 7.1 7.1  ALBUMIN 3.5 3.4*   Recent Labs  Lab 02/08/20 1107 02/13/20 0033  LIPASE 30 25   No results for input(s): AMMONIA in  the last 168 hours.  CBC: Recent Labs  Lab 02/08/20 1107 02/13/20 0033 02/14/20 0540  WBC 6.6 10.5 7.8  HGB 10.6* 11.0* 9.4*  HCT 32.7* 35.0* 28.3*  MCV 97.6 99.7 98.3  PLT 127* 153 124*    Cardiac Enzymes: No results for input(s): CKTOTAL, CKMB, CKMBINDEX, TROPONINI in the last 168 hours.  BNP: Invalid input(s): POCBNP  CBG: Recent Labs  Lab 02/13/20 1843 02/13/20 2033 02/14/20 0829 02/14/20 1123 02/14/20 1632  GLUCAP 99 131* 137* 148* 123*    Microbiology: Results for orders placed or performed during the hospital encounter of 02/13/20  SARS Coronavirus 2 by RT PCR (hospital order, performed in [facility] hospital lab) Nasopharyngeal Nasopharyngeal Swab     Status: None   Collection Time: 02/13/20  3:01 AM   Specimen: Nasopharyngeal Swab  Result Value Ref Range Status   SARS Coronavirus 2 NEGATIVE NEGATIVE Final    Comment: (NOTE) SARS-CoV-2 target nucleic acids are NOT DETECTED.  The SARS-CoV-2 RNA is generally detectable in upper and lower respiratory specimens during the acute phase of infection. The lowest concentration of SARS-CoV-2 viral copies this assay can detect is 250 copies / mL. A negative result does not preclude SARS-CoV-2 infection and should not be used as the sole basis for treatment or other patient management decisions.  A negative result may occur with improper specimen collection / handling, submission of specimen other than nasopharyngeal  swab, presence of viral mutation(s) within the areas targeted by this assay, and inadequate number of viral copies (<250 copies / mL). A negative result must be combined with clinical observations, patient history, and epidemiological information.  Fact Sheet for Patients:   StrictlyIdeas.no  Fact Sheet for Healthcare Providers: BankingDealers.co.za  This test is not yet approved or  cleared by the [place] FDA and has been authorized for  detection and/or diagnosis of SARS-CoV-2 by FDA under an Emergency Use Authorization (EUA).  This EUA will remain in effect (meaning this test can be used) for the duration of the COVID-19 declaration under Section 564(b)(1) of the Act, 21 U.S.C. section 360bbb-3(b)(1), unless the authorization is terminated or revoked sooner.  Performed at [facility], [address] [place], [place] [address]     Coagulation Studies: Recent Labs    02/13/20 0033  LABPROT 12.9  INR 1.0    Urinalysis: No results for input(s): COLORURINE, LABSPEC, PHURINE, GLUCOSEU, HGBUR, BILIRUBINUR, KETONESUR, PROTEINUR, UROBILINOGEN, NITRITE, LEUKOCYTESUR in the last 72 hours.  Invalid input(s): APPERANCEUR    Imaging: DG Chest 2 View  Result Date: 02/13/2020 CLINICAL DATA:  Chest pain. EXAM: CHEST - 2 VIEW COMPARISON:  CT dated July 01, 2019. FINDINGS: There are coarse interstitial lung markings bilaterally with hazy bibasilar airspace opacities which appear to be new since 2015 chest x-ray and the patient's recent CT. The heart size is mildly enlarged but stable from prior study. The patient is status post prior CABG. There is no pneumothorax. There are dilated loops of small bowel in the upper abdomen. There are small bilateral pleural effusions. IMPRESSION: 1. Prominent interstitial lung markings with new small bilateral pleural effusions raises concern for developing congestive heart failure. An underlying atypical infectious process is not excluded. 2. Dilated loops of bowel in the upper abdomen consider a dedicated abdominal radiograph for further evaluation. Electronically Signed   By: Constance Holster M.D.   On: 02/13/2020 01:17   DG Sacrum/Coccyx  Result Date: 02/13/2020 CLINICAL DATA:  Fall several months ago injuring tailbone. Persistent pain. EXAM: SACRUM AND COCCYX - 2+ VIEW COMPARISON:  12/20/2011 FINDINGS: No evidence of acute fracture. Mild posterior angulation of the distal  aspect of the coccyx unchanged. There are degenerative changes of the spine. Stents over the iliac and femoral arteries bilaterally. IMPRESSION: No acute findings. Electronically Signed   By: Marin Olp M.D.   On: 02/13/2020 16:16     Medications:   . heparin 1,700 Units/hr (02/14/20 1534)   . carvedilol  12.5 mg Oral BID  . insulin aspart  0-15 Units Subcutaneous TID WC  . insulin aspart  0-5 Units Subcutaneous QHS  . isosorbide mononitrate  60 mg Oral Daily  . simvastatin  20 mg Oral q1800   acetaminophen, albuterol, ALPRAZolam, morphine injection, nitroGLYCERIN, nitroGLYCERIN, ondansetron (ZOFRAN) IV, oxyCODONE-acetaminophen  Assessment/ Plan:  64 y.o. male with past medical history of coronary artery disease status post CABG, chronic kidney disease stage IV, diabetes mellitus type 2, chronic diastolic heart failure most recent ejection fraction 50 to 55%, aortic valve stenosis, anemia of chronic kidney disease, secondary hyperparathyroidism who was admitted with chest pain.  1.  Acute kidney injury/chronic kidney disease stage IV.  Baseline EGFR appears to be 19 from 02/08/2020.  EGFR now down to 15.  Diuretics have been held.  Patient has normal ejection fraction.  Cardiology recommending right and left heart catheterization.  Patient high risk for contrast-induced nephropathy.  We will plan for very gentle IV  fluid hydration with 0.9 normal saline at 40 cc/h recognizing that this is not the optimal strategy given his underlying history of diastolic heart failure.  We did explain to the patient that there is some significant risk that he may need renal replacement therapy.  2.  Coronary artery disease/unstable angina.  Right and left heart catheterization recommended.  High risk for contrast-induced nephropathy, see discussion above.  3.  Anemia of chronic kidney disease.  Hold off on Epogen for now.   LOS: 1 [name] [name] 7/25/20214:50 PM

## 2020-02-14 NOTE — Progress Notes (Signed)
Subjective:  Chest pain off / on last night Dyspnea improved  Objective:  Vitals:   02/13/20 1639 02/13/20 1953 02/13/20 2248 02/14/20 0321  BP: (!) 149/75 (!) 113/58 113/70 113/73  Pulse: 79 88 86 72  Resp: 18 17  19   Temp: 97.7 F (36.5 C) 97.8 F (36.6 C)  97.9 F (36.6 C)  TempSrc: Oral   Oral  SpO2: 97% 100% 97% 100%  Weight:    (!) 107.2 kg  Height:        Intake/Output from previous day:  Intake/Output Summary (Last 24 hours) at 02/14/2020 0941 Last data filed at 02/14/2020 0555 Gross per 24 hour  Intake 50.4 ml  Output 400 ml  Net -349.6 ml    Physical Exam:  Jovial Obese white male  HEENT: normal Neck post left CEA JVP normal no bruits no thyromegaly Lungs clear with no wheezing and good diaphragmatic motion Heart:  S1/S2 AS  murmur, no rub, gallop or click PMI normal Abdomen: benighn, BS positve, no tenderness, no AAA no bruit.  No HSM or HJR Distal pulses intact with no bruits Plus one bilateral  edema Neuro non-focal Skin warm and dry No muscular weakness   Lab Results: Basic Metabolic Panel: Recent Labs    02/13/20 0033 02/14/20 0540  NA 139 136  K 4.9 4.7  CL 105 104  CO2 26 26  GLUCOSE 156* 144*  BUN 33* 44*  CREATININE 3.22* 3.86*  CALCIUM 8.5* 8.0*   Liver Function Tests: Recent Labs    02/13/20 0033  AST 18  ALT 15  ALKPHOS 95  BILITOT 0.6  PROT 7.1  ALBUMIN 3.4*   Recent Labs    02/13/20 0033  LIPASE 25   CBC: Recent Labs    02/13/20 0033 02/14/20 0540  WBC 10.5 7.8  HGB 11.0* 9.4*  HCT 35.0* 28.3*  MCV 99.7 98.3  PLT 153 124*   Hemoglobin A1C: Recent Labs    02/13/20 0525  HGBA1C 5.7*   Fasting Lipid Panel: Recent Labs    02/13/20 0525  CHOL 122  HDL 33*  LDLCALC 56  TRIG 163*  CHOLHDL 3.7    Imaging: DG Chest 2 View  Result Date: 02/13/2020 CLINICAL DATA:  Chest pain. EXAM: CHEST - 2 VIEW COMPARISON:  CT dated July 01, 2019. FINDINGS: There are coarse interstitial lung markings  bilaterally with hazy bibasilar airspace opacities which appear to be new since 2015 chest x-ray and the patient's recent CT. The heart size is mildly enlarged but stable from prior study. The patient is status post prior CABG. There is no pneumothorax. There are dilated loops of small bowel in the upper abdomen. There are small bilateral pleural effusions. IMPRESSION: 1. Prominent interstitial lung markings with new small bilateral pleural effusions raises concern for developing congestive heart failure. An underlying atypical infectious process is not excluded. 2. Dilated loops of bowel in the upper abdomen consider a dedicated abdominal radiograph for further evaluation. Electronically Signed   By: Constance Holster M.D.   On: 02/13/2020 01:17   DG Sacrum/Coccyx  Result Date: 02/13/2020 CLINICAL DATA:  Fall several months ago injuring tailbone. Persistent pain. EXAM: SACRUM AND COCCYX - 2+ VIEW COMPARISON:  12/20/2011 FINDINGS: No evidence of acute fracture. Mild posterior angulation of the distal aspect of the coccyx unchanged. There are degenerative changes of the spine. Stents over the iliac and femoral arteries bilaterally. IMPRESSION: No acute findings. Electronically Signed   By: Marin Olp M.D.   On:  02/13/2020 16:16    Cardiac Studies:  ECG: SR LVH poor R wave progression no acute changes    Telemetry:  NSR 02/14/2020   Echo: pending TTE done 01/11/20 with EF 50-55% moderate AS mean gradient 26 AVA 1.06 DVI 0.25   Medications:   . carvedilol  12.5 mg Oral BID  . insulin aspart  0-15 Units Subcutaneous TID WC  . insulin aspart  0-5 Units Subcutaneous QHS  . isosorbide mononitrate  60 mg Oral Daily  . simvastatin  20 mg Oral q1800     . heparin 1,500 Units/hr (02/13/20 2049)    Assessment/Plan:   1. CAD:  Distant CABG 2012 with increasing angina continue heparin and beta blocker nitrates increased troponin elevated 403.  Despite CRF think he needs right and left heart cath and  may be headed toward dialysis with stent intervention to coronary arteries / plus/minus TAVR or redo CABG/AVR.    2. CRF:  Volume overload with dyspnea and edema BNP 713 diuretic held this am due to azotemia Cr up to 3.86 this am and lasix held He understands he will likely need dialysis but doesn't want to have his heart "ruined" waiting for it Would have nephrology see today pre cath   3. CEA:  Left repeat cartotids pending  4. AS: repeat echo pending also for EF given angina and elevated troponin AS more moderate to severe last echo with DVI 0.25 and AVA right around 1 cm2     Jenkins Rouge 02/14/2020, 9:41 AM

## 2020-02-15 ENCOUNTER — Inpatient Hospital Stay: Payer: Medicare Other

## 2020-02-15 ENCOUNTER — Encounter
Admission: EM | Disposition: A | Discharge status: Home / Self Care | Payer: Self-pay | Source: Home / Self Care | Attending: Family Medicine

## 2020-02-15 ENCOUNTER — Encounter: Payer: Self-pay | Admitting: Cardiovascular Disease

## 2020-02-15 DIAGNOSIS — I5031 Acute diastolic (congestive) heart failure: Secondary | ICD-10-CM

## 2020-02-15 DIAGNOSIS — N184 Chronic kidney disease, stage 4 (severe): Secondary | ICD-10-CM

## 2020-02-15 HISTORY — PX: RIGHT/LEFT HEART CATH AND CORONARY/GRAFT ANGIOGRAPHY: CATH118267

## 2020-02-15 LAB — BLOOD GAS, ARTERIAL
Acid-base deficit: 5.7 mmol/L — ABNORMAL HIGH (ref 0.0–2.0)
Bicarbonate: 20.7 mmol/L (ref 20.0–28.0)
Delivery systems: POSITIVE
Expiratory PAP: 6
FIO2: 0.28
Inspiratory PAP: 12
O2 Saturation: 86.1 %
Patient temperature: 37
RATE: 8 resp/min
pCO2 arterial: 43 mmHg (ref 32.0–48.0)
pH, Arterial: 7.29 — ABNORMAL LOW (ref 7.350–7.450)
pO2, Arterial: 58 mmHg — ABNORMAL LOW (ref 83.0–108.0)

## 2020-02-15 LAB — BASIC METABOLIC PANEL
Anion gap: 9 (ref 5–15)
BUN: 51 mg/dL — ABNORMAL HIGH (ref 8–23)
CO2: 22 mmol/L (ref 22–32)
Calcium: 7.7 mg/dL — ABNORMAL LOW (ref 8.9–10.3)
Chloride: 105 mmol/L (ref 98–111)
Creatinine, Ser: 3.92 mg/dL — ABNORMAL HIGH (ref 0.61–1.24)
GFR calc Af Amer: 17 mL/min — ABNORMAL LOW (ref >60)
GFR calc non Af Amer: 15 mL/min — ABNORMAL LOW (ref >60)
Glucose, Bld: 151 mg/dL — ABNORMAL HIGH (ref 70–99)
Potassium: 4.8 mmol/L (ref 3.5–5.1)
Sodium: 136 mmol/L (ref 135–145)

## 2020-02-15 LAB — CBC
HCT: 29.3 % — ABNORMAL LOW (ref 39.0–52.0)
Hemoglobin: 9.8 g/dL — ABNORMAL LOW (ref 13.0–17.0)
MCH: 32.2 pg (ref 26.0–34.0)
MCHC: 33.4 g/dL (ref 30.0–36.0)
MCV: 96.4 fL (ref 80.0–100.0)
Platelets: 139 10*3/uL — ABNORMAL LOW (ref 150–400)
RBC: 3.04 MIL/uL — ABNORMAL LOW (ref 4.22–5.81)
RDW: 14.8 % (ref 11.5–15.5)
WBC: 7.4 10*3/uL (ref 4.0–10.5)
nRBC: 0 % (ref 0.0–0.2)

## 2020-02-15 LAB — GLUCOSE, CAPILLARY
Glucose-Capillary: 127 mg/dL — ABNORMAL HIGH (ref 70–99)
Glucose-Capillary: 108 mg/dL — ABNORMAL HIGH (ref 70–99)
Glucose-Capillary: 145 mg/dL — ABNORMAL HIGH (ref 70–99)
Glucose-Capillary: 158 mg/dL — ABNORMAL HIGH (ref 70–99)
Glucose-Capillary: 175 mg/dL — ABNORMAL HIGH (ref 70–99)
Glucose-Capillary: 176 mg/dL — ABNORMAL HIGH (ref 70–99)

## 2020-02-15 LAB — MRSA PCR SCREENING: MRSA by PCR: NEGATIVE

## 2020-02-15 LAB — HEPARIN LEVEL (UNFRACTIONATED): Heparin Unfractionated: 0.27 IU/mL — ABNORMAL LOW (ref 0.30–0.70)

## 2020-02-15 SURGERY — INVASIVE LAB ABORTED CASE

## 2020-02-15 MED ORDER — METHYLPREDNISOLONE SODIUM SUCC 125 MG IJ SOLR: 125.0000 mg | Freq: Once | INTRAMUSCULAR | Status: AC

## 2020-02-15 MED ORDER — LIDOCAINE HCL (PF) 1 % IJ SOLN
INTRAMUSCULAR | Status: AC
  Filled 2020-02-15: qty 30

## 2020-02-15 MED ORDER — SODIUM CHLORIDE 0.9 % IV SOLN: INTRAVENOUS | Status: DC

## 2020-02-15 MED ORDER — SODIUM CHLORIDE 0.9% FLUSH: 3.0000 mL | INTRAVENOUS | Status: DC | PRN

## 2020-02-15 MED ORDER — HEPARIN (PORCINE) IN NACL 1000-0.9 UT/500ML-% IV SOLN
INTRAVENOUS | Status: AC
  Filled 2020-02-15: qty 1000

## 2020-02-15 MED ORDER — NITROGLYCERIN IN D5W 200-5 MCG/ML-% IV SOLN
0.0000 ug/min | INTRAVENOUS | Status: DC
  Administered 2020-02-15: 10 ug/min via INTRAVENOUS

## 2020-02-15 MED ORDER — FUROSEMIDE 10 MG/ML IJ SOLN: 80.0000 mg | Freq: Once | INTRAMUSCULAR | Status: AC

## 2020-02-15 MED ORDER — METHYLPREDNISOLONE SODIUM SUCC 125 MG IJ SOLR
INTRAMUSCULAR | Status: AC
  Administered 2020-02-15: 125 mg via INTRAVENOUS
  Filled 2020-02-15: qty 2

## 2020-02-15 MED ORDER — ASPIRIN 81 MG PO CHEW
CHEWABLE_TABLET | ORAL | Status: AC
  Administered 2020-02-15: 81 mg via ORAL
  Filled 2020-02-15: qty 1

## 2020-02-15 MED ORDER — SODIUM CHLORIDE 0.9 % IV SOLN: 250.0000 mL | INTRAVENOUS | Status: DC | PRN

## 2020-02-15 MED ORDER — NITROGLYCERIN IN D5W 200-5 MCG/ML-% IV SOLN
INTRAVENOUS | Status: AC
  Filled 2020-02-15: qty 250

## 2020-02-15 MED ORDER — ASPIRIN 81 MG PO CHEW: 81.0000 mg | CHEWABLE_TABLET | ORAL | Status: AC

## 2020-02-15 MED ORDER — NITROGLYCERIN IN D5W 200-5 MCG/ML-% IV SOLN
INTRAVENOUS | Status: AC | PRN
  Administered 2020-02-15: 10 ug/min via INTRAVENOUS

## 2020-02-15 MED ORDER — FUROSEMIDE 10 MG/ML IJ SOLN
10.0000 mg/h | INTRAVENOUS | Status: DC
  Administered 2020-02-15 – 2020-02-16 (×2): 10 mg/h via INTRAVENOUS
  Filled 2020-02-15 (×2): qty 25

## 2020-02-15 MED ORDER — DIPHENHYDRAMINE HCL 50 MG/ML IJ SOLN
INTRAMUSCULAR | Status: AC
  Administered 2020-02-15: 50 mg via INTRAMUSCULAR
  Filled 2020-02-15: qty 1

## 2020-02-15 MED ORDER — FENTANYL CITRATE (PF) 100 MCG/2ML IJ SOLN
INTRAMUSCULAR | Status: DC | PRN
  Administered 2020-02-15: 25 ug via INTRAVENOUS

## 2020-02-15 MED ORDER — MIDAZOLAM HCL 2 MG/2ML IJ SOLN
INTRAMUSCULAR | Status: AC
  Filled 2020-02-15: qty 2

## 2020-02-15 MED ORDER — FUROSEMIDE 10 MG/ML IJ SOLN: 40.0000 mg | Freq: Once | INTRAMUSCULAR | Status: DC

## 2020-02-15 MED ORDER — SODIUM CHLORIDE 0.9% FLUSH: 3.0000 mL | Freq: Two times a day (BID) | INTRAVENOUS | Status: DC

## 2020-02-15 MED ORDER — FUROSEMIDE 10 MG/ML IJ SOLN
INTRAMUSCULAR | Status: AC
  Administered 2020-02-15: 80 mg via INTRAVENOUS
  Filled 2020-02-15: qty 8

## 2020-02-15 MED ORDER — DIPHENHYDRAMINE HCL 50 MG/ML IJ SOLN: 50.0000 mg | Freq: Once | INTRAMUSCULAR | Status: AC

## 2020-02-15 MED ORDER — FENTANYL CITRATE (PF) 100 MCG/2ML IJ SOLN
INTRAMUSCULAR | Status: AC
  Filled 2020-02-15: qty 2

## 2020-02-15 MED ORDER — MIDAZOLAM HCL 2 MG/2ML IJ SOLN
INTRAMUSCULAR | Status: DC | PRN
  Administered 2020-02-15: 1 mg via INTRAVENOUS

## 2020-02-15 MED ORDER — CHLORHEXIDINE GLUCONATE CLOTH 2 % EX PADS
6.0000 | MEDICATED_PAD | Freq: Every day | CUTANEOUS | Status: DC
  Administered 2020-02-15 – 2020-02-17 (×2): 6 via TOPICAL

## 2020-02-15 SURGICAL SUPPLY — 6 items
CANNULA 5F STIFF (CANNULA) ×2 IMPLANT
KIT MANI 3VAL PERCEP (MISCELLANEOUS) ×4 IMPLANT
PACK CARDIAC CATH (CUSTOM PROCEDURE TRAY) ×4 IMPLANT
SHEATH AVANTI 5FR X 11CM (SHEATH) IMPLANT
SHEATH AVANTI 7FRX11 (SHEATH) IMPLANT
WIRE GUIDERIGHT .035X150 (WIRE) IMPLANT

## 2020-02-15 NOTE — Progress Notes (Signed)
Handoff report given to Jinny Blossom, RN in ICU.  All pt's personal belongings moved to ICU-10.  Spoke with pt's emergency contact, Cleophas Dunker, and provided her with and update on pt's progress and transfer to ICU.

## 2020-02-15 NOTE — Progress Notes (Signed)
Pt complained of SOB and chest pain 10/10 around 0030. He expressed relief of 5/10 after 2 mg morphine and SL nitro given x 1. On call NP made aware. Stat EKG obtained. Respiratory called for PRN neb. Patient later called out due to more chest pain and shortness of breath. 2mg  morphine given and SL nitro x3 at 0440 with no relief. On call NP made aware and came to bedside. Stat chest XR, EKG, and ABGs ordered. NP and RN will monitor closely.

## 2020-02-15 NOTE — Interval H&P Note (Signed)
Cath Lab Visit (complete for each Cath Lab visit)  Clinical Evaluation Leading to the Procedure:   ACS: Yes.    Non-ACS:  n/a   History and Physical Interval Note:  02/15/2020 12:24 PM  [name] [name]  has presented today for surgery, with the diagnosis of unstable angina.  The various methods of treatment have been discussed with the patient and family. After consideration of risks, benefits and other options for treatment, the patient has consented to  Procedure(s): RIGHT/LEFT HEART CATH AND CORONARY/GRAFT ANGIOGRAPHY (N/A) as a surgical intervention.  The patient's history has been reviewed, patient examined, no change in status, stable for surgery.  I have reviewed the patient's chart and labs.  Questions were answered to the patient's satisfaction.     [name]

## 2020-02-15 NOTE — Progress Notes (Signed)
Progress Note  Patient Name: [name] [name] Date of Encounter: 02/15/2020  Primary Cardiologist: Fletcher Anon  Subjective   He had a rough night with reports of chest discomfort, flushing, and chills. EKG overnight not acute. GFR 15.   Inpatient Medications    Scheduled Meds: . [START ON 02/16/2020] aspirin  81 mg Oral Pre-Cath  . carvedilol  12.5 mg Oral BID  . insulin aspart  0-15 Units Subcutaneous TID WC  . insulin aspart  0-5 Units Subcutaneous QHS  . isosorbide mononitrate  60 mg Oral Daily  . simvastatin  20 mg Oral q1800  . sodium chloride flush  3 mL Intravenous Q12H   Continuous Infusions: . sodium chloride    . [START ON 02/16/2020] sodium chloride    . sodium chloride Stopped (02/15/20 0436)  . heparin 1,800 Units/hr (02/15/20 0835)   PRN Meds: sodium chloride, acetaminophen, albuterol, ALPRAZolam, morphine injection, nitroGLYCERIN, nitroGLYCERIN, ondansetron (ZOFRAN) IV, oxyCODONE-acetaminophen, sodium chloride flush   Vital Signs    Vitals:   02/15/20 0434 02/15/20 0504 02/15/20 0612 02/15/20 0720  BP: (!) 142/75 (!) 141/68 (!) 151/66 (!) 157/95  Pulse: 84 81 80 90  Resp:   (!) 24 18  Temp: 98 F (36.7 C) 98.6 F (37 C)  98 F (36.7 C)  TempSrc: Oral Oral    SpO2: 96% 95% 100% 90%  Weight: (!) 107.5 kg     Height:        Intake/Output Summary (Last 24 hours) at 02/15/2020 0844 Last data filed at 02/15/2020 0422 Gross per 24 hour  Intake 1041.39 ml  Output 800 ml  Net 241.39 ml   Filed Weights   02/13/20 1307 02/14/20 0321 02/15/20 0434  Weight: (!) 107.7 kg (!) 107.2 kg (!) 107.5 kg    Telemetry    SR, 11 beats of NSVT, possible further WCT vs artifact - Personally Reviewed  ECG    NSR, 72 bpm, left axis deviation, 1st degree AV block, poor R wave progression along the precordial leads, nonspecific inferolateral st/t changes - Personally Reviewed  Physical Exam   GEN: No acute distress.   Neck: No JVD. Cardiac: RRR, III/VI systolic  murmur in the aortic area, no rubs, or gallops.  Respiratory: Clear to auscultation bilaterally.  GI: Soft, nontender, non-distended.   MS: No edema; No deformity. Neuro:  Alert and oriented x 3; Nonfocal.  Psych: Normal affect.  Labs    Chemistry Recent Labs  Lab 02/08/20 1107 02/08/20 1107 02/13/20 0033 02/14/20 0540 02/15/20 0731  NA 138   < > 139 136 136  K 5.1   < > 4.9 4.7 4.8  CL 104   < > 105 104 105  CO2 28   < > 26 26 22   GLUCOSE 112*   < > 156* 144* 151*  BUN 39*   < > 33* 44* 51*  CREATININE 3.17*   < > 3.22* 3.86* 3.92*  CALCIUM 9.3   < > 8.5* 8.0* 7.7*  PROT 7.1  --  7.1  --   --   ALBUMIN 3.5  --  3.4*  --   --   AST 16  --  18  --   --   ALT 19  --  15  --   --   ALKPHOS 84  --  95  --   --   BILITOT 0.7  --  0.6  --   --   GFRNONAA 19*   < > 19* 15* 15*  GFRAA 22*   < > 22* 18* 17*  ANIONGAP 6   < > 8 6 9    < > = values in this interval not displayed.     Hematology Recent Labs  Lab 02/13/20 0033 02/14/20 0540 02/15/20 0731  WBC 10.5 7.8 7.4  RBC 3.51* 2.88* 3.04*  HGB 11.0* 9.4* 9.8*  HCT 35.0* 28.3* 29.3*  MCV 99.7 98.3 96.4  MCH 31.3 32.6 32.2  MCHC 31.4 33.2 33.4  RDW 14.7 14.8 14.8  PLT 153 124* 139*    Cardiac EnzymesNo results for input(s): TROPONINI in the last 168 hours. No results for input(s): TROPIPOC in the last 168 hours.   BNP Recent Labs  Lab 02/13/20 0033  BNP 713.3*     DDimer No results for input(s): DDIMER in the last 168 hours.   Radiology    DG Chest 1 View  Result Date: 02/15/2020 IMPRESSION: Cardiomegaly with pulmonary vascular congestion. Interstitial edema and small pleural effusions. The appearance is felt to be indicative of a degree of congestive heart failure. No consolidation. Status post coronary artery bypass grafting. Electronically Signed   By: Lowella Grip III M.D.   On: 02/15/2020 05:36   DG Sacrum/Coccyx  Result Date: 02/13/2020 IMPRESSION: No acute findings. Electronically Signed    By: Marin Olp M.D.   On: 02/13/2020 16:16    Cardiac Studies   2D echo 01/11/2020: 1. Left ventricular ejection fraction, by estimation, is 50 to 55%. The  left ventricle has low normal function. The left ventricle has no regional  wall motion abnormalities. There is mild left ventricular hypertrophy.  Left ventricular diastolic  parameters are consistent with Grade II diastolic dysfunction  (pseudonormalization).  2. Right ventricular systolic function is normal. The right ventricular  size is normal. There is moderately elevated pulmonary artery systolic  pressure.  3. Left atrial size was mildly dilated.  4. The mitral valve is abnormal. Mild to moderate mitral valve  regurgitation. No evidence of mitral stenosis.  5. The aortic valve is abnormal. Aortic valve regurgitation is not  visualized. Moderate aortic valve stenosis. Aortic valve area, by VTI  measures 1.06 cm. Aortic valve mean gradient measures 26.0 mmHg.  6. Aortic dilatation noted. There is mild dilatation of the ascending  aorta measuring 41 mm.  7. The inferior vena cava is normal in size with <50% respiratory  variability, suggesting right atrial pressure of 8 mmHg.   Patient Profile     64 y.o. male with history of CAD status post CABG in 2012, moderate aortic stenosis, carotid artery disease status post left-sided CEA, DM2, CKD stage IV, anemia of chronic disease, HTN, HLD, PAD status post extensive lower extremity revascularization by vascular surgery in 05/2019, tobacco use, obesity, and psoriasis who is being seen today for the evaluation of NSTEMI.  Assessment & Plan    1. CAD involving the native coronary arteries status post CABG with non-STEMI: -Currently chest pain-free, though did have chest pain overnight with nonacute EKG -Overall, this is a difficult situation given the patient's complex medical history including CKD stage IV and underlying aortic stenosis -Recent echo as above -Continue  heparin drip -ASA -Imdur -NPO -Planning for [facility] this morning -Patient is aware of the risk this may place him on dialysis permanently at this time and accepts this -Risks and benefits of cardiac catheterization have been discussed with the patient including risks of bleeding, bruising, infection, kidney damage leading to permanent dialysis, stroke, heart attack, urgent need for bypass, injury  to a limb, and death. The patient understands these risks and is willing to proceed with the procedure. All questions have been answered and concerns listened to -Pre-cath contrast steroids/Benadryl given  2.  Moderate aortic stenosis: -Recent echo as above -R/LHC as outlined above  3.  CKD stage IV: -Stable -Nephrology following -Patient is aware proceeding with cardiac cath greatly increases his need to start dialysis at this time and accepts this  4.  HTN: -Mildly elevated -Imdur -Carvedilol -Advance post cath as needed  5.  HLD: -Most recent LDL of 34 -Check lipid panel -PTA statin  6.  Anemia of chronic disease: -Stable  7. NSVT: -Cardiac cath as above -Coreg -Potassium at goal -Check magnesium with recommendation to replete to goal 2.0 as indicated  For questions or updates, please contact [place] Please consult [email or web address] for contact info under Cardiology/STEMI.    Signed, Christell Faith, PA-C [place] Pager: [phone] 02/15/2020, 8:44 AM

## 2020-02-15 NOTE — Progress Notes (Signed)
[facility]  ROUNDING NOTE   Subjective:  Patient well-known to [place] as we follow him for chronic kidney disease stage IV. Comes in with angina and chest pain. Patient was unable to undergo cardiac catheterization because he could not lay flat Currently on BiPAP when seen  Objective:  Vital signs in last 24 hours:  Temp:  [97.6 F (36.4 C)-99.2 F (37.3 C)] 98 F (36.7 C) (07/26 1600) Pulse Rate:  [72-90] 75 (07/26 1600) Resp:  [15-25] 15 (07/26 1600) BP: (135-159)/(63-98) 153/80 (07/26 1600) SpO2:  [90 %-100 %] 97 % (07/26 1600) FiO2 (%):  [28 %] 28 % (07/26 1600) Weight:  [107.5 kg] 107.5 kg (07/26 0434)  Weight change: -0.229 kg Filed Weights   02/13/20 1307 02/14/20 0321 02/15/20 0434  Weight: (!) 107.7 kg (!) 107.2 kg (!) 107.5 kg    Intake/Output: I/O last 3 completed shifts: In: 1041.4 [I.V.:1041.4] Out: 1200 [Urine:1200]   Intake/Output this shift:  Total I/O In: 20.9 [I.V.:20.9] Out: 900 [Urine:900]  Physical Exam: General:  Ill-appearing  Head: Normocephalic, atraumatic. Moist oral mucosal membranes  Eyes: Anicteric  Neck: Supple, trachea midline  Lungs:   Mild bilateral crackles, requiring NIPPV  Heart: Q0G8 2/6 systolic ejection murmur  Abdomen:  Soft, nontender, bowel sounds present  Extremities: 3+ peripheral edema.  Neurologic:  Lethargic but able to follow few simple commands  Skin: No lesions       Basic Metabolic Panel: Recent Labs  Lab 02/13/20 0033 02/14/20 0540 02/15/20 0731  NA 139 136 136  K 4.9 4.7 4.8  CL 105 104 105  CO2 26 26 22   GLUCOSE 156* 144* 151*  BUN 33* 44* 51*  CREATININE 3.22* 3.86* 3.92*  CALCIUM 8.5* 8.0* 7.7*    Liver Function Tests: Recent Labs  Lab 02/13/20 0033  AST 18  ALT 15  ALKPHOS 95  BILITOT 0.6  PROT 7.1  ALBUMIN 3.4*   Recent Labs  Lab 02/13/20 0033  LIPASE 25   No results for input(s): AMMONIA in the last 168 hours.  CBC: Recent Labs  Lab 02/13/20 0033  02/14/20 0540 02/15/20 0731  WBC 10.5 7.8 7.4  HGB 11.0* 9.4* 9.8*  HCT 35.0* 28.3* 29.3*  MCV 99.7 98.3 96.4  PLT 153 124* 139*    Cardiac Enzymes: No results for input(s): CKTOTAL, CKMB, CKMBINDEX, TROPONINI in the last 168 hours.  BNP: Invalid input(s): POCBNP  CBG: Recent Labs  Lab 02/14/20 2114 02/15/20 0812 02/15/20 1025 02/15/20 1258 02/15/20 1535  GLUCAP 102* 127* 108* 145* 158*    Microbiology: Results for orders placed or performed during the hospital encounter of 02/13/20  SARS Coronavirus 2 by RT PCR (hospital order, performed in [facility] hospital lab) Nasopharyngeal Nasopharyngeal Swab     Status: None   Collection Time: 02/13/20  3:01 AM   Specimen: Nasopharyngeal Swab  Result Value Ref Range Status   SARS Coronavirus 2 NEGATIVE NEGATIVE Final    Comment: (NOTE) SARS-CoV-2 target nucleic acids are NOT DETECTED.  The SARS-CoV-2 RNA is generally detectable in upper and lower respiratory specimens during the acute phase of infection. The lowest concentration of SARS-CoV-2 viral copies this assay can detect is 250 copies / mL. A negative result does not preclude SARS-CoV-2 infection and should not be used as the sole basis for treatment or other patient management decisions.  A negative result may occur with improper specimen collection / handling, submission of specimen other than nasopharyngeal swab, presence of viral mutation(s) within the areas targeted  by this assay, and inadequate number of viral copies (<250 copies / mL). A negative result must be combined with clinical observations, patient history, and epidemiological information.  Fact Sheet for Patients:   StrictlyIdeas.no  Fact Sheet for Healthcare Providers: BankingDealers.co.za  This test is not yet approved or  cleared by the [place] FDA and has been authorized for detection and/or diagnosis of SARS-CoV-2 by FDA under an Emergency  Use Authorization (EUA).  This EUA will remain in effect (meaning this test can be used) for the duration of the COVID-19 declaration under Section 564(b)(1) of the Act, 21 U.S.C. section 360bbb-3(b)(1), unless the authorization is terminated or revoked sooner.  Performed at [facility], [address] [place], [place] [address]   MRSA PCR Screening     Status: None   Collection Time: 02/15/20  1:37 PM   Specimen: Nasal Mucosa; Nasopharyngeal  Result Value Ref Range Status   MRSA by PCR NEGATIVE NEGATIVE Final    Comment:        The GeneXpert MRSA Assay (FDA approved for NASAL specimens only), is one component of a comprehensive MRSA colonization surveillance program. It is not intended to diagnose MRSA infection nor to guide or monitor treatment for MRSA infections. Performed at [facility], [address] [place], [place] [address]     Coagulation Studies: Recent Labs    02/13/20 0033  LABPROT 12.9  INR 1.0    Urinalysis: No results for input(s): COLORURINE, LABSPEC, PHURINE, GLUCOSEU, HGBUR, BILIRUBINUR, KETONESUR, PROTEINUR, UROBILINOGEN, NITRITE, LEUKOCYTESUR in the last 72 hours.  Invalid input(s): APPERANCEUR    Imaging: DG Chest 1 View  Result Date: 02/15/2020 CLINICAL DATA:  Shortness of breath EXAM: CHEST  1 VIEW COMPARISON:  February 13, 2020 chest radiograph; chest CT July 01, 2019 FINDINGS: There remains cardiomegaly with pulmonary venous hypertension. There is interstitial edema with small pleural effusions bilaterally. No airspace opacity. Patient is status post coronary artery bypass grafting. No adenopathy. No bone lesions. IMPRESSION: Cardiomegaly with pulmonary vascular congestion. Interstitial edema and small pleural effusions. The appearance is felt to be indicative of a degree of congestive heart failure. No consolidation. Status post coronary artery bypass grafting. Electronically Signed   By: Lowella Grip III M.D.   On:  02/15/2020 05:36   CARDIAC CATHETERIZATION  Result Date: 02/15/2020 Aborted right and left cardiac catheterization due to respiratory distress and volume overload. We will place the patient on BiPAP, nitroglycerin drip for blood pressure control and diuresis with plans to perform right and left cardiac catheterization once respiratory status improves.  ABORTED INVASIVE LAB PROCEDURE  Result Date: 02/15/2020 Aborted right and left cardiac catheterization due to respiratory distress and volume overload. We will place the patient on BiPAP, nitroglycerin drip for blood pressure control and diuresis with plans to perform right and left cardiac catheterization once respiratory status improves.    Medications:   . furosemide (LASIX) infusion 10 mg/hr (02/15/20 1613)  . nitroGLYCERIN 10 mcg/min (02/15/20 1613)   . carvedilol  12.5 mg Oral BID  . Chlorhexidine Gluconate Cloth  6 each Topical Daily  . insulin aspart  0-15 Units Subcutaneous TID WC  . insulin aspart  0-5 Units Subcutaneous QHS  . isosorbide mononitrate  60 mg Oral Daily  . simvastatin  20 mg Oral q1800   acetaminophen, albuterol, ALPRAZolam, morphine injection, nitroGLYCERIN, nitroGLYCERIN, ondansetron (ZOFRAN) IV, oxyCODONE-acetaminophen  Assessment/ Plan:  64 y.o. male with past medical history of coronary artery disease status post CABG, chronic kidney disease  stage IV, diabetes mellitus type 2, chronic diastolic heart failure most recent ejection fraction 50 to 55%, aortic valve stenosis, anemia of chronic kidney disease, secondary hyperparathyroidism who was admitted with chest pain.  1.  Acute kidney injury/chronic kidney disease stage IV with Volume overload  Baseline EGFR appears to be 19 from 02/08/2020.  EGFR now down to 15.   Patient has normal ejection fraction.  Cardiology recommending right and left heart catheterization.  Patient high risk for contrast-induced nephropathy.  Patient has developed volume overload.   He has peripheral edema as well as pulmonary edema.  He is being started on IV Lasix infusion.  If response is poor and he clinically worsens, may need to initiate renal replacement therapy.  Continue to monitor closely.  2.  Coronary artery disease/unstable angina.   Cardiac cath deferred for now EF 50 to 55%, moderate aortic stenosis    LOS: 2 Lio Wehrly Candiss Norse 7/26/20214:46 PM

## 2020-02-15 NOTE — Progress Notes (Signed)
PROGRESS NOTE    [name] [name]  CLE:[id number] DOB: 1956-04-15 DOA: 02/13/2020 PCP: [name], MD   Brief Narrative: [name] [name] is a 64 y.o. male with medical history significant for CAD status post CABG, CKD 4, diabetes. Patient presented secondary to chest pain and shortness of breath with evidence of NSTEMI on admission. Heparin drip started. Cardiology consulted.    Assessment & Plan:   Principal Problem:   NSTEMI (non-ST elevated myocardial infarction) ([place]) Active Problems:   Coronary artery disease   Type 2 diabetes mellitus with diabetic peripheral angiopathy without gangrene ([place])   CKD stage 4 due to type 2 diabetes mellitus (HCC)   Moderate aortic valve stenosis   Acute diastolic CHF (congestive heart failure) (HCC)   Hypertensive urgency   Chronic kidney disease   NSTEMI CAD s/p CABG Patient now chest pain free. Complicated by CKD stage IV. Cardiology consulted. -Cardiology recommendations: Continue heparin drip, Transthoracic Echocardiogram, aspirin, Imdur, L/R heart cath for today -Nephrology consulted for management of possible contrast nephropathy and transition to ESRD  Moderate aortic stenosis Slightly worsened from previous per recent cardiology note however still moderate. Transthoracic Echocardiogram ordered as mentioned above  Essential hypertension BP is slightly soft and had an episode of hypotension yesterday -Cardiology recommendations: Imdur, Coreg  Back pain Related to fall suffered some months ago. Tenderness over sacrum. No fracture on x-ray.  Acute on chronic diastolic heart failure In setting of fluid overload seen on chest x-ray -Continue Coreg -Lasix per cardiology/nephrology  CKD Stage IV Worsened. Possibly in setting of Lasix use but was also hypotensive. -Nephrology recommendations  Hyperlipidemia -Continue simvastatin  Anemia of chronic disease In setting of CKD. Mild. Hemoglobin stable.   DVT prophylaxis:  Heparin drip Code Status:   Code Status: Full Code Family Communication: Grandson at bedside Disposition Plan: Discharge pending cardiology recommendations for management of NSTEMI   Consultants:   Cardiology  Procedures:   None  Antimicrobials:  None    Subjective: Recurrent substernal chest pain. Dyspnea overnight as well with patient concerned for fluid in his lungs.  Objective: Vitals:   02/15/20 0434 02/15/20 0504 02/15/20 0612 02/15/20 0720  BP: (!) 142/75 (!) 141/68 (!) 151/66 (!) 157/95  Pulse: 84 81 80 90  Resp:   (!) 24 18  Temp: 98 F (36.7 C) 98.6 F (37 C)  98 F (36.7 C)  TempSrc: Oral Oral    SpO2: 96% 95% 100% 90%  Weight: (!) 107.5 kg     Height:        Intake/Output Summary (Last 24 hours) at 02/15/2020 0936 Last data filed at 02/15/2020 0422 Gross per 24 hour  Intake 1041.39 ml  Output 800 ml  Net 241.39 ml   Filed Weights   02/13/20 1307 02/14/20 0321 02/15/20 0434  Weight: (!) 107.7 kg (!) 107.2 kg (!) 107.5 kg    Examination:  General exam: Appears calm and comfortable Respiratory system: Clear to auscultation. Respiratory effort normal. Cardiovascular system: S1 & S2 heard, RRR. No murmurs, rubs, gallops or clicks. Gastrointestinal system: Abdomen is distended, soft and nontender. No organomegaly or masses felt. Normal bowel sounds heard. Central nervous system: Somnolent but easily arouses and is oriented. No focal neurological deficits. Musculoskeletal: No calf tenderness Skin: No cyanosis. No rashes Psychiatry: Judgement and insight appear normal. Mood & affect appropriate.     Data Reviewed: I have personally reviewed following labs and imaging studies  CBC Lab Results  Component Value Date   WBC 7.4 02/15/2020  RBC 3.04 (L) 02/15/2020   HGB 9.8 (L) 02/15/2020   HCT 29.3 (L) 02/15/2020   MCV 96.4 02/15/2020   MCH 32.2 02/15/2020   PLT 139 (L) 02/15/2020   MCHC 33.4 02/15/2020   RDW 14.8 02/15/2020   LYMPHSABS 2.1  03/15/2017   MONOABS 0.4 03/15/2017   EOSABS 0.2 03/15/2017   BASOSABS 0.1 27/78/2423     Last metabolic panel Lab Results  Component Value Date   NA 136 02/15/2020   K 4.8 02/15/2020   CL 105 02/15/2020   CO2 22 02/15/2020   BUN 51 (H) 02/15/2020   CREATININE 3.92 (H) 02/15/2020   GLUCOSE 151 (H) 02/15/2020   GFRNONAA 15 (L) 02/15/2020   GFRAA 17 (L) 02/15/2020   CALCIUM 7.7 (L) 02/15/2020   PHOS 4.5 04/07/2019   PROT 7.1 02/13/2020   ALBUMIN 3.4 (L) 02/13/2020   BILITOT 0.6 02/13/2020   ALKPHOS 95 02/13/2020   AST 18 02/13/2020   ALT 15 02/13/2020   ANIONGAP 9 02/15/2020    CBG (last 3)  Recent Labs    02/14/20 1632 02/14/20 2114 02/15/20 0812  GLUCAP 123* 102* 127*     GFR: Estimated Creatinine Clearance: 21.7 mL/min (A) (by C-G formula based on SCr of 3.92 mg/dL (H)).  Coagulation Profile: Recent Labs  Lab 02/13/20 0033  INR 1.0    Recent Results (from the past 240 hour(s))  SARS Coronavirus 2 by RT PCR (hospital order, performed in [facility] hospital lab) Nasopharyngeal Nasopharyngeal Swab     Status: None   Collection Time: 02/13/20  3:01 AM   Specimen: Nasopharyngeal Swab  Result Value Ref Range Status   SARS Coronavirus 2 NEGATIVE NEGATIVE Final    Comment: (NOTE) SARS-CoV-2 target nucleic acids are NOT DETECTED.  The SARS-CoV-2 RNA is generally detectable in upper and lower respiratory specimens during the acute phase of infection. The lowest concentration of SARS-CoV-2 viral copies this assay can detect is 250 copies / mL. A negative result does not preclude SARS-CoV-2 infection and should not be used as the sole basis for treatment or other patient management decisions.  A negative result may occur with improper specimen collection / handling, submission of specimen other than nasopharyngeal swab, presence of viral mutation(s) within the areas targeted by this assay, and inadequate number of viral copies (<250 copies / mL). A negative  result must be combined with clinical observations, patient history, and epidemiological information.  Fact Sheet for Patients:   StrictlyIdeas.no  Fact Sheet for Healthcare Providers: BankingDealers.co.za  This test is not yet approved or  cleared by the [place] FDA and has been authorized for detection and/or diagnosis of SARS-CoV-2 by FDA under an Emergency Use Authorization (EUA).  This EUA will remain in effect (meaning this test can be used) for the duration of the COVID-19 declaration under Section 564(b)(1) of the Act, 21 U.S.C. section 360bbb-3(b)(1), unless the authorization is terminated or revoked sooner.  Performed at [facility], [address], [place], [place] [address]         Radiology Studies: DG Chest 1 View  Result Date: 02/15/2020 CLINICAL DATA:  Shortness of breath EXAM: CHEST  1 VIEW COMPARISON:  February 13, 2020 chest radiograph; chest CT July 01, 2019 FINDINGS: There remains cardiomegaly with pulmonary venous hypertension. There is interstitial edema with small pleural effusions bilaterally. No airspace opacity. Patient is status post coronary artery bypass grafting. No adenopathy. No bone lesions. IMPRESSION: Cardiomegaly with pulmonary vascular congestion. Interstitial edema and small pleural  effusions. The appearance is felt to be indicative of a degree of congestive heart failure. No consolidation. Status post coronary artery bypass grafting. Electronically Signed   By: Lowella Grip III M.D.   On: 02/15/2020 05:36   DG Sacrum/Coccyx  Result Date: 02/13/2020 CLINICAL DATA:  Fall several months ago injuring tailbone. Persistent pain. EXAM: SACRUM AND COCCYX - 2+ VIEW COMPARISON:  12/20/2011 FINDINGS: No evidence of acute fracture. Mild posterior angulation of the distal aspect of the coccyx unchanged. There are degenerative changes of the spine. Stents over the iliac and femoral arteries  bilaterally. IMPRESSION: No acute findings. Electronically Signed   By: Marin Olp M.D.   On: 02/13/2020 16:16        Scheduled Meds: . [START ON 02/16/2020] aspirin  81 mg Oral Pre-Cath  . carvedilol  12.5 mg Oral BID  . insulin aspart  0-15 Units Subcutaneous TID WC  . insulin aspart  0-5 Units Subcutaneous QHS  . isosorbide mononitrate  60 mg Oral Daily  . simvastatin  20 mg Oral q1800  . sodium chloride flush  3 mL Intravenous Q12H  . sodium chloride flush  3 mL Intravenous Q12H   Continuous Infusions: . sodium chloride    . [START ON 02/16/2020] sodium chloride    . sodium chloride Stopped (02/15/20 0436)  . sodium chloride    . [START ON 02/16/2020] sodium chloride    . heparin 1,800 Units/hr (02/15/20 0835)     LOS: 2 days     Cordelia Poche, MD [facility] 02/15/2020, 9:36 AM  If 7PM-7AM, please contact night-coverage [email or web address]

## 2020-02-15 NOTE — Progress Notes (Signed)
Pt arrived to ICU at this time. Pt on bipap and is very drowsy but responds to voice. Able to answer some questions and follow simple commands. VSS at this time. Breathing normal. Nitro gtt infusing.

## 2020-02-15 NOTE — Progress Notes (Signed)
SVN given for sob. Pt states it feels like when he starts to get fluid buildup in lungs.

## 2020-02-15 NOTE — Progress Notes (Signed)
ANTICOAGULATION CONSULT NOTE  Pharmacy Consult for Heparin Indication: chest pain/ACS  Patient Measurements: Height: 5\' 7"  (170.2 cm) Weight: (!) 107.5 kg (236 lb 15.9 oz) IBW/kg (Calculated) : 66.1 HEPARIN DW (KG): 90.2  Vital Signs: Temp: 98 F (36.7 C) (07/26 0720) Temp Source: Oral (07/26 0504) BP: 157/95 (07/26 0720) Pulse Rate: 90 (07/26 0720)  Labs: Recent Labs    02/13/20 0033 02/13/20 0302 02/13/20 0525 02/13/20 1326 02/13/20 2320 02/14/20 0540 02/14/20 0950 02/14/20 1856 02/15/20 0731  HGB 11.0*  --   --   --   --  9.4*  --   --   --   HCT 35.0*  --   --   --   --  28.3*  --   --   --   PLT 153  --   --   --   --  124*  --   --   --   APTT  --   --  76*  --   --   --   --   --   --   LABPROT 12.9  --   --   --   --   --   --   --   --   INR 1.0  --   --   --   --   --   --   --   --   HEPARINUNFRC  --   --   --    < >   < >  --  0.26* 0.37 0.27*  CREATININE 3.22*  --   --   --   --  3.86*  --   --  3.92*  TROPONINIHS 403* 470*  --   --   --   --   --   --   --    < > = values in this interval not displayed.    Estimated Creatinine Clearance: 21.7 mL/min (A) (by C-G formula based on SCr of 3.92 mg/dL (H)).   Medical History: Past Medical History:  Diagnosis Date   Adenomatous colon polyp    Allergy    Angina at rest [facility])    chronic   Anxiety    Arthritis    RA   Bell's palsy 2007   Carotid artery occlusion    Cataract    Dr. Dawna Part   Coronary artery disease    Depression    Diabetes mellitus    Diverticulosis    Duodenitis    DVT (deep venous thrombosis) (HCC)    in leg   Dyspnea    DOE   Gastropathy    GERD (gastroesophageal reflux disease)    Heart murmur    Helicobacter pylori gastritis    Hyperlipidemia    Hypertension    Kidney failure    Mild aortic stenosis    Neuropathy    Obesity    Peripheral vascular disease (HCC)    Post splenectomy syndrome    Psoriasis    Psoriatic arthritis (HCC)     Renal disorder    stage 4 kidney failure   Sleep apnea    uses cpap   SOB (shortness of breath)    Stroke (HCC)    Tobacco use disorder    recently quit   Tubular adenoma 08/2013   Dr. Elsie Ra urinary stream    Assessment: Heparin initiated for ACS.  No anticoagulants PTA per med list. Cardiology is following and recommending right and left cardiac catheterization. Patient  with AKI on CKD and is in fluid overload.   Goal of Therapy:  Heparin level 0.3-0.7 units/ml Monitor platelets by anticoagulation protocol: Yes   Plan:  --HL 0.27 - slightly subtherapeutic, increase rate to 1800 units/hr and recheck confirmatory level in 8 hours --CBC stable, continue to check daily while on heparin infusion  Benita Gutter 02/15/2020 8:13 AM

## 2020-02-15 NOTE — Progress Notes (Signed)
Pt becoming very agitated at this time after foley catheter placed. He continues to attempt to climb out of bed and has pulled out 2 IVs. Pt is yelling that he wants catheter removed and bipap removed. Bipap was removed and 4L [place] placed on pt. Foley was also removed at this time. Pt was given morphine and is resting better now. Pt is aware that he may have to go back on bipap if respiratory status worsens. VSS at this time. Lasix gtt is on hold while another IV is being started.

## 2020-02-16 DIAGNOSIS — I25118 Atherosclerotic heart disease of native coronary artery with other forms of angina pectoris: Secondary | ICD-10-CM

## 2020-02-16 DIAGNOSIS — E1151 Type 2 diabetes mellitus with diabetic peripheral angiopathy without gangrene: Secondary | ICD-10-CM

## 2020-02-16 DIAGNOSIS — E1122 Type 2 diabetes mellitus with diabetic chronic kidney disease: Secondary | ICD-10-CM

## 2020-02-16 DIAGNOSIS — I160 Hypertensive urgency: Secondary | ICD-10-CM

## 2020-02-16 LAB — BASIC METABOLIC PANEL
Anion gap: 13 (ref 5–15)
BUN: 62 mg/dL — ABNORMAL HIGH (ref 8–23)
CO2: 26 mmol/L (ref 22–32)
Calcium: 8.6 mg/dL — ABNORMAL LOW (ref 8.9–10.3)
Chloride: 100 mmol/L (ref 98–111)
Creatinine, Ser: 3.98 mg/dL — ABNORMAL HIGH (ref 0.61–1.24)
GFR calc Af Amer: 17 mL/min — ABNORMAL LOW (ref >60)
GFR calc non Af Amer: 15 mL/min — ABNORMAL LOW (ref >60)
Glucose, Bld: 170 mg/dL — ABNORMAL HIGH (ref 70–99)
Potassium: 4.6 mmol/L (ref 3.5–5.1)
Sodium: 139 mmol/L (ref 135–145)

## 2020-02-16 LAB — GLUCOSE, CAPILLARY
Glucose-Capillary: 139 mg/dL — ABNORMAL HIGH (ref 70–99)
Glucose-Capillary: 184 mg/dL — ABNORMAL HIGH (ref 70–99)
Glucose-Capillary: 168 mg/dL — ABNORMAL HIGH (ref 70–99)
Glucose-Capillary: 327 mg/dL — ABNORMAL HIGH (ref 70–99)
Glucose-Capillary: 141 mg/dL — ABNORMAL HIGH (ref 70–99)

## 2020-02-16 NOTE — Progress Notes (Addendum)
Progress Note  Patient Name: [name] [name] Date of Encounter: 02/16/2020  Primary Cardiologist: Fletcher Anon  Subjective   R/LHC aborted 7/26 secondary to dyspnea and severe orthopnea. Started on BiPAP, nitro gtt and Lasix gtt. Planning for [facility] later this week pending respiratory status. Renal function continues to trend up with BUN/SCr 51/3.92-->62/3.98 with a baseline GFR around 19, currently 15. Documented UOP 3 L for the past 24 hours with a documented net - 3.5 L for the admission. Weight trend 107.5-->107.7 kg. Agitated last evening, pulled out 2 IVs.   Dyspnea improving. Now off BiPAP and nitro gtt. He remains on Lasix gtt. On supplemental oxygen via nasal cannula at 4 L. Somnolent. BP in the 678L systolic. No chest pain.   Inpatient Medications    Scheduled Meds: . carvedilol  12.5 mg Oral BID  . Chlorhexidine Gluconate Cloth  6 each Topical Daily  . insulin aspart  0-15 Units Subcutaneous TID WC  . insulin aspart  0-5 Units Subcutaneous QHS  . isosorbide mononitrate  60 mg Oral Daily  . simvastatin  20 mg Oral q1800   Continuous Infusions: . furosemide (LASIX) infusion 10 mg/hr (02/16/20 0600)  . nitroGLYCERIN Stopped (02/16/20 0738)   PRN Meds: acetaminophen, albuterol, ALPRAZolam, morphine injection, nitroGLYCERIN, nitroGLYCERIN, ondansetron (ZOFRAN) IV, oxyCODONE-acetaminophen   Vital Signs    Vitals:   02/16/20 0600 02/16/20 0700 02/16/20 0730 02/16/20 0800  BP: (!) 161/89 (!) 169/86 (!) 143/67 (!) 155/79  Pulse: 75 64 73 73  Resp:      Temp:   (!) 97.3 F (36.3 C)   TempSrc:      SpO2: 98% 100% 97% 100%  Weight:      Height:        Intake/Output Summary (Last 24 hours) at 02/16/2020 1015 Last data filed at 02/16/2020 0800 Gross per 24 hour  Intake 176.19 ml  Output 3850 ml  Net -3673.81 ml   Filed Weights   02/14/20 0321 02/15/20 0434 02/16/20 0500  Weight: (!) 107.2 kg (!) 107.5 kg (!) 107.7 kg    Telemetry    SR, 11 beats of NSVT, possible  further WCT vs artifact - Personally Reviewed  ECG    NSR, 83 bpm, left axis deviation, 1st degree AV block, poor R wave progression along the precordial leads, nonspecific inferolateral st/t changes - Personally Reviewed  Physical Exam   GEN: No acute distress.   Neck: No JVD. Cardiac: RRR, III/VI systolic murmur in the aortic area, no rubs, or gallops.  Respiratory: Clear to auscultation bilaterally.  GI: Soft, nontender, non-distended.   MS: No edema; No deformity. Neuro:  Alert and oriented x 3; Nonfocal.  Psych: Normal affect.  Labs    Chemistry Recent Labs  Lab 02/13/20 0033 02/13/20 0033 02/14/20 0540 02/15/20 0731 02/16/20 0416  NA 139   < > 136 136 139  K 4.9   < > 4.7 4.8 4.6  CL 105   < > 104 105 100  CO2 26   < > 26 22 26   GLUCOSE 156*   < > 144* 151* 170*  BUN 33*   < > 44* 51* 62*  CREATININE 3.22*   < > 3.86* 3.92* 3.98*  CALCIUM 8.5*   < > 8.0* 7.7* 8.6*  PROT 7.1  --   --   --   --   ALBUMIN 3.4*  --   --   --   --   AST 18  --   --   --   --  ALT 15  --   --   --   --   ALKPHOS 95  --   --   --   --   BILITOT 0.6  --   --   --   --   GFRNONAA 19*   < > 15* 15* 15*  GFRAA 22*   < > 18* 17* 17*  ANIONGAP 8   < > 6 9 13    < > = values in this interval not displayed.     Hematology Recent Labs  Lab 02/13/20 0033 02/14/20 0540 02/15/20 0731  WBC 10.5 7.8 7.4  RBC 3.51* 2.88* 3.04*  HGB 11.0* 9.4* 9.8*  HCT 35.0* 28.3* 29.3*  MCV 99.7 98.3 96.4  MCH 31.3 32.6 32.2  MCHC 31.4 33.2 33.4  RDW 14.7 14.8 14.8  PLT 153 124* 139*    Cardiac EnzymesNo results for input(s): TROPONINI in the last 168 hours. No results for input(s): TROPIPOC in the last 168 hours.   BNP Recent Labs  Lab 02/13/20 0033  BNP 713.3*     DDimer No results for input(s): DDIMER in the last 168 hours.   Radiology    DG Chest 1 View  Result Date: 02/15/2020 IMPRESSION: Cardiomegaly with pulmonary vascular congestion. Interstitial edema and small pleural  effusions. The appearance is felt to be indicative of a degree of congestive heart failure. No consolidation. Status post coronary artery bypass grafting. Electronically Signed   By: Lowella Grip III M.D.   On: 02/15/2020 05:36   DG Sacrum/Coccyx  Result Date: 02/13/2020 IMPRESSION: No acute findings. Electronically Signed   By: Marin Olp M.D.   On: 02/13/2020 16:16    Cardiac Studies   2D echo 01/11/2020: 1. Left ventricular ejection fraction, by estimation, is 50 to 55%. The  left ventricle has low normal function. The left ventricle has no regional  wall motion abnormalities. There is mild left ventricular hypertrophy.  Left ventricular diastolic  parameters are consistent with Grade II diastolic dysfunction  (pseudonormalization).  2. Right ventricular systolic function is normal. The right ventricular  size is normal. There is moderately elevated pulmonary artery systolic  pressure.  3. Left atrial size was mildly dilated.  4. The mitral valve is abnormal. Mild to moderate mitral valve  regurgitation. No evidence of mitral stenosis.  5. The aortic valve is abnormal. Aortic valve regurgitation is not  visualized. Moderate aortic valve stenosis. Aortic valve area, by VTI  measures 1.06 cm. Aortic valve mean gradient measures 26.0 mmHg.  6. Aortic dilatation noted. There is mild dilatation of the ascending  aorta measuring 41 mm.  7. The inferior vena cava is normal in size with <50% respiratory  variability, suggesting right atrial pressure of 8 mmHg.   Patient Profile     64 y.o. male with history of CAD status post CABG in 2012, moderate aortic stenosis, carotid artery disease status post left-sided CEA, DM2, CKD stage IV, anemia of chronic disease, HTN, HLD, PAD status post extensive lower extremity revascularization by vascular surgery in 05/2019, tobacco use, obesity, and psoriasis who is being seen today for the evaluation of NSTEMI.  Assessment & Plan      1. CAD involving the native coronary arteries status post CABG with non-STEMI: -R/LHC 7/26 aborted secondary to worsening dyspnea and orthopnea requiring BiPAP, nitro gtt, and Lasix gtt in the setting of CKD stage IV with plan for possible [facility] later this week pending respiratory status -If intervention is needed, this would likely need  to be staged -Currently, chest pain-free -Overall, this is a difficult situation given the patient's complex medical history including CKD stage IV and underlying aortic stenosis -Recent echo as above -He has completed medical therapy with heparin drip -ASA -Imdur -Coreg -Patient is aware of the risk of cardiac cath may place him on dialysis permanently at this time and accepts this -He will need pre-cath allergy treatment   2.  Moderate aortic stenosis: -Recent echo as above -R/LHC as outlined above  3. HFpEF with pulmonary hypertension: -Respiratory status is improving -Lasix gtt -Off nitro gtt and BiPAP -Wean supplemental oxygen as able -Daily weights -Strict I/O  4.  CKD stage IV: -Stable -Nephrology following -Patient is aware proceeding with cardiac cath greatly increases his need to start dialysis at this time and accepts this  5.  HTN: -Mildly elevated -Imdur -Carvedilol -Diuresis as above  6.  HLD: -LDL of 56 this admission -PTA statin  7.  Anemia of chronic disease: -Stable    For questions or updates, please contact [place] Please consult [email or web address] for contact info under Cardiology/STEMI.    Signed, Christell Faith, PA-C [facility] Pager: [phone] 02/16/2020, 10:15 AM

## 2020-02-16 NOTE — Progress Notes (Signed)
PROGRESS NOTE    [name] [name]  HYQ:[id number] DOB: [birth year] DOA: 02/13/2020 PCP: [name], MD   Brief Narrative: [name] [name] is a 64 y.o. male with medical history significant for CAD status post CABG, CKD 4, diabetes. Patient presented secondary to chest pain and shortness of breath with evidence of NSTEMI on admission. Heparin drip started. Cardiology consulted.    Assessment & Plan:   Principal Problem:   NSTEMI (non-ST elevated myocardial infarction) ([place]) Active Problems:   Coronary artery disease   Type 2 diabetes mellitus with diabetic peripheral angiopathy without gangrene ([place])   CKD stage 4 due to type 2 diabetes mellitus (HCC)   Moderate aortic valve stenosis   Acute diastolic CHF (congestive heart failure) (HCC)   Hypertensive urgency   Chronic kidney disease   NSTEMI CAD s/p CABG Patient now chest pain free. Complicated by CKD stage IV. Cardiology consulted. -Cardiology recommendations: Nitro drip, aspirin. Plan for [facility] which was postponed secondary to acute respiratory failure/distress -Nephrology consulted for management of possible contrast nephropathy and transition to ESRD  Acute respiratory failure with hypoxia Acute respiratory distress Acute pulmonary edema Appears to be secondary to fluid overload from heart failure and likely compounded by renal failure. Patient received Lasix 80 IV x1 followed by lasix infusion with good diuresis documented; several unmeasured occurrences documented as well. Weight does not appear to correlate with UOP as it has remained about stable from yesterday -Wean to room air as able -Fluid management per nephrology; concern he may eventually need RRT  Moderate aortic stenosis Slightly worsened from previous per recent cardiology note however still moderate.  Essential hypertension BP is slightly soft and had an episode of hypotension yesterday -Cardiology recommendations: Imdur, Coreg  Back  pain Related to fall suffered some months ago. Tenderness over sacrum. No fracture on x-ray.  Acute on chronic diastolic heart failure In setting of fluid overload seen on chest x-ray -Continue Coreg -Lasix per cardiology/nephrology. Currently discontinued  CKD Stage IV Worsened. Possibly in setting of Lasix use but was also hypotensive. -Nephrology recommendations  Hyperlipidemia -Continue simvastatin  Anemia of chronic disease In setting of CKD. Mild. Hemoglobin stable.   DVT prophylaxis: Heparin drip Code Status:   Code Status: Full Code Family Communication: Grandson at bedside Disposition Plan: Discharge pending cardiology recommendations for management of NSTEMI   Consultants:   Cardiology  Nephrology  PCCM  Procedures:   None  Antimicrobials:  None    Subjective: No dyspnea or chest pain  Objective: Vitals:   02/16/20 0500 02/16/20 0600 02/16/20 0700 02/16/20 0730  BP: (!) 135/82 (!) 161/89 (!) 169/86 (!) 143/67  Pulse: 69 75 64 73  Resp:      Temp:    (!) 97.3 F (36.3 C)  TempSrc:      SpO2: 94% 98% 100% 97%  Weight: (!) 107.7 kg     Height:        Intake/Output Summary (Last 24 hours) at 02/16/2020 0848 Last data filed at 02/16/2020 9528 Gross per 24 hour  Intake 176.19 ml  Output 3500 ml  Net -3323.81 ml   Filed Weights   02/14/20 0321 02/15/20 0434 02/16/20 0500  Weight: (!) 107.2 kg (!) 107.5 kg (!) 107.7 kg    Examination:  General exam: Appears calm and comfortable Respiratory system: Diminished with no wheezing. Respiratory effort normal. Cardiovascular system: S1 & S2 heard, RRR. 2/6 systolic murmur Gastrointestinal system: Abdomen is protuberant, nondistended, soft and nontender. No organomegaly or masses felt. Normal  bowel sounds heard. Central nervous system: Alert and oriented. No focal neurological deficits. Musculoskeletal: BLE edema. No calf tenderness Skin: No cyanosis. No rashes Psychiatry: Judgement and insight  appear normal. Mood & affect appropriate.     Data Reviewed: I have personally reviewed following labs and imaging studies  CBC Lab Results  Component Value Date   WBC 7.4 02/15/2020   RBC 3.04 (L) 02/15/2020   HGB 9.8 (L) 02/15/2020   HCT 29.3 (L) 02/15/2020   MCV 96.4 02/15/2020   MCH 32.2 02/15/2020   PLT 139 (L) 02/15/2020   MCHC 33.4 02/15/2020   RDW 14.8 02/15/2020   LYMPHSABS 2.1 03/15/2017   MONOABS 0.4 03/15/2017   EOSABS 0.2 03/15/2017   BASOSABS 0.1 17/40/8144     Last metabolic panel Lab Results  Component Value Date   NA 139 02/16/2020   K 4.6 02/16/2020   CL 100 02/16/2020   CO2 26 02/16/2020   BUN 62 (H) 02/16/2020   CREATININE 3.98 (H) 02/16/2020   GLUCOSE 170 (H) 02/16/2020   GFRNONAA 15 (L) 02/16/2020   GFRAA 17 (L) 02/16/2020   CALCIUM 8.6 (L) 02/16/2020   PHOS 4.5 04/07/2019   PROT 7.1 02/13/2020   ALBUMIN 3.4 (L) 02/13/2020   BILITOT 0.6 02/13/2020   ALKPHOS 95 02/13/2020   AST 18 02/13/2020   ALT 15 02/13/2020   ANIONGAP 13 02/16/2020    CBG (last 3)  Recent Labs    02/15/20 1930 02/15/20 2140 02/16/20 0725  GLUCAP 175* 176* 139*     GFR: Estimated Creatinine Clearance: 21.4 mL/min (A) (by C-G formula based on SCr of 3.98 mg/dL (H)).  Coagulation Profile: Recent Labs  Lab 02/13/20 0033  INR 1.0    Recent Results (from the past 240 hour(s))  SARS Coronavirus 2 by RT PCR (hospital order, performed in [facility] hospital lab) Nasopharyngeal Nasopharyngeal Swab     Status: None   Collection Time: 02/13/20  3:01 AM   Specimen: Nasopharyngeal Swab  Result Value Ref Range Status   SARS Coronavirus 2 NEGATIVE NEGATIVE Final    Comment: (NOTE) SARS-CoV-2 target nucleic acids are NOT DETECTED.  The SARS-CoV-2 RNA is generally detectable in upper and lower respiratory specimens during the acute phase of infection. The lowest concentration of SARS-CoV-2 viral copies this assay can detect is 250 copies / mL. A negative result  does not preclude SARS-CoV-2 infection and should not be used as the sole basis for treatment or other patient management decisions.  A negative result may occur with improper specimen collection / handling, submission of specimen other than nasopharyngeal swab, presence of viral mutation(s) within the areas targeted by this assay, and inadequate number of viral copies (<250 copies / mL). A negative result must be combined with clinical observations, patient history, and epidemiological information.  Fact Sheet for Patients:   StrictlyIdeas.no  Fact Sheet for Healthcare Providers: BankingDealers.co.za  This test is not yet approved or  cleared by the [place] FDA and has been authorized for detection and/or diagnosis of SARS-CoV-2 by FDA under an Emergency Use Authorization (EUA).  This EUA will remain in effect (meaning this test can be used) for the duration of the COVID-19 declaration under Section 564(b)(1) of the Act, 21 U.S.C. section 360bbb-3(b)(1), unless the authorization is terminated or revoked sooner.  Performed at [facility], [address], [place], [place] [address]   MRSA PCR Screening     Status: None   Collection Time: 02/15/20  1:37 PM  Specimen: Nasal Mucosa; Nasopharyngeal  Result Value Ref Range Status   MRSA by PCR NEGATIVE NEGATIVE Final    Comment:        The GeneXpert MRSA Assay (FDA approved for NASAL specimens only), is one component of a comprehensive MRSA colonization surveillance program. It is not intended to diagnose MRSA infection nor to guide or monitor treatment for MRSA infections. Performed at [facility], [address], [place], [place] [address]         Radiology Studies: DG Chest 1 View  Result Date: 02/15/2020 CLINICAL DATA:  Shortness of breath EXAM: CHEST  1 VIEW COMPARISON:  February 13, 2020 chest radiograph; chest CT July 01, 2019 FINDINGS: There  remains cardiomegaly with pulmonary venous hypertension. There is interstitial edema with small pleural effusions bilaterally. No airspace opacity. Patient is status post coronary artery bypass grafting. No adenopathy. No bone lesions. IMPRESSION: Cardiomegaly with pulmonary vascular congestion. Interstitial edema and small pleural effusions. The appearance is felt to be indicative of a degree of congestive heart failure. No consolidation. Status post coronary artery bypass grafting. Electronically Signed   By: Lowella Grip III M.D.   On: 02/15/2020 05:36   CARDIAC CATHETERIZATION  Result Date: 02/15/2020 Aborted right and left cardiac catheterization due to respiratory distress and volume overload. We will place the patient on BiPAP, nitroglycerin drip for blood pressure control and diuresis with plans to perform right and left cardiac catheterization once respiratory status improves.  ABORTED INVASIVE LAB PROCEDURE  Result Date: 02/15/2020 Aborted right and left cardiac catheterization due to respiratory distress and volume overload. We will place the patient on BiPAP, nitroglycerin drip for blood pressure control and diuresis with plans to perform right and left cardiac catheterization once respiratory status improves.       Scheduled Meds: . carvedilol  12.5 mg Oral BID  . Chlorhexidine Gluconate Cloth  6 each Topical Daily  . insulin aspart  0-15 Units Subcutaneous TID WC  . insulin aspart  0-5 Units Subcutaneous QHS  . isosorbide mononitrate  60 mg Oral Daily  . simvastatin  20 mg Oral q1800   Continuous Infusions: . furosemide (LASIX) infusion 10 mg/hr (02/16/20 0600)  . nitroGLYCERIN Stopped (02/16/20 5462)     LOS: 3 days     Cordelia Poche, MD [facility] 02/16/2020, 8:48 AM  If 7PM-7AM, please contact night-coverage [email or web address]

## 2020-02-16 NOTE — Progress Notes (Signed)
1653 Patient complained of chest pain. Given NTG suglingual x3 with relief. No nausea,diaphoresis or change in vital signs noted.

## 2020-02-16 NOTE — Progress Notes (Signed)
[facility]  ROUNDING NOTE   Subjective:  Patient well-known to [place] as we follow him for chronic kidney disease stage IV. Comes in with angina and chest pain. Patient was unable to undergo cardiac catheterization because he could not lay flat  Was placed on IV furosemide infusion with good response.  Urine output of 3200 cc yesterday Serum creatinine is about the same as yesterday  Objective:  Vital signs in last 24 hours:  Temp:  [97.3 F (36.3 C)-99.2 F (37.3 C)] 97.3 F (36.3 C) (07/27 0730) Pulse Rate:  [64-95] 73 (07/27 0800) Resp:  [13-25] 13 (07/27 0100) BP: (132-180)/(66-142) 155/79 (07/27 0800) SpO2:  [84 %-100 %] 100 % (07/27 0800) FiO2 (%):  [28 %] 28 % (07/27 0700) Weight:  [107.7 kg] 107.7 kg (07/27 0500)  Weight change: 0.2 kg Filed Weights   02/14/20 0321 02/15/20 0434 02/16/20 0500  Weight: (!) 107.2 kg (!) 107.5 kg (!) 107.7 kg    Intake/Output: I/O last 3 completed shifts: In: 1217.6 [I.V.:1217.6] Out: 4000 [Urine:4000]   Intake/Output this shift:  Total I/O In: -  Out: 650 [Urine:650]  Physical Exam: General:  Ill-appearing  Head: Normocephalic, atraumatic. Moist oral mucosal membranes  Eyes: Anicteric  Neck: Supple, trachea midline  Lungs:   Decreased breath sounds at bases, [place] O2, otherwise clear  Heart: P8E4 2/6 systolic ejection murmur  Abdomen:  Soft, nontender, bowel sounds present  Extremities: + peripheral edema.  Neurologic:  Lethargic but able to follow few simple commands  Skin: No lesions       Basic Metabolic Panel: Recent Labs  Lab 02/13/20 0033 02/13/20 0033 02/14/20 0540 02/15/20 0731 02/16/20 0416  NA 139  --  136 136 139  K 4.9  --  4.7 4.8 4.6  CL 105  --  104 105 100  CO2 26  --  26 22 26   GLUCOSE 156*  --  144* 151* 170*  BUN 33*  --  44* 51* 62*  CREATININE 3.22*  --  3.86* 3.92* 3.98*  CALCIUM 8.5*   < > 8.0* 7.7* 8.6*   < > = values in this interval not displayed.    Liver Function  Tests: Recent Labs  Lab 02/13/20 0033  AST 18  ALT 15  ALKPHOS 95  BILITOT 0.6  PROT 7.1  ALBUMIN 3.4*   Recent Labs  Lab 02/13/20 0033  LIPASE 25   No results for input(s): AMMONIA in the last 168 hours.  CBC: Recent Labs  Lab 02/13/20 0033 02/14/20 0540 02/15/20 0731  WBC 10.5 7.8 7.4  HGB 11.0* 9.4* 9.8*  HCT 35.0* 28.3* 29.3*  MCV 99.7 98.3 96.4  PLT 153 124* 139*    Cardiac Enzymes: No results for input(s): CKTOTAL, CKMB, CKMBINDEX, TROPONINI in the last 168 hours.  BNP: Invalid input(s): POCBNP  CBG: Recent Labs  Lab 02/15/20 1258 02/15/20 1535 02/15/20 1930 02/15/20 2140 02/16/20 0725  GLUCAP 145* 158* 175* 176* 139*    Microbiology: Results for orders placed or performed during the hospital encounter of 02/13/20  SARS Coronavirus 2 by RT PCR (hospital order, performed in [facility] hospital lab) Nasopharyngeal Nasopharyngeal Swab     Status: None   Collection Time: 02/13/20  3:01 AM   Specimen: Nasopharyngeal Swab  Result Value Ref Range Status   SARS Coronavirus 2 NEGATIVE NEGATIVE Final    Comment: (NOTE) SARS-CoV-2 target nucleic acids are NOT DETECTED.  The SARS-CoV-2 RNA is generally detectable in upper and lower respiratory specimens during  the acute phase of infection. The lowest concentration of SARS-CoV-2 viral copies this assay can detect is 250 copies / mL. A negative result does not preclude SARS-CoV-2 infection and should not be used as the sole basis for treatment or other patient management decisions.  A negative result may occur with improper specimen collection / handling, submission of specimen other than nasopharyngeal swab, presence of viral mutation(s) within the areas targeted by this assay, and inadequate number of viral copies (<250 copies / mL). A negative result must be combined with clinical observations, patient history, and epidemiological information.  Fact Sheet for Patients:    StrictlyIdeas.no  Fact Sheet for Healthcare Providers: BankingDealers.co.za  This test is not yet approved or  cleared by the [place] FDA and has been authorized for detection and/or diagnosis of SARS-CoV-2 by FDA under an Emergency Use Authorization (EUA).  This EUA will remain in effect (meaning this test can be used) for the duration of the COVID-19 declaration under Section 564(b)(1) of the Act, 21 U.S.C. section 360bbb-3(b)(1), unless the authorization is terminated or revoked sooner.  Performed at [facility], [address] [place], [place] [address]   MRSA PCR Screening     Status: None   Collection Time: 02/15/20  1:37 PM   Specimen: Nasal Mucosa; Nasopharyngeal  Result Value Ref Range Status   MRSA by PCR NEGATIVE NEGATIVE Final    Comment:        The GeneXpert MRSA Assay (FDA approved for NASAL specimens only), is one component of a comprehensive MRSA colonization surveillance program. It is not intended to diagnose MRSA infection nor to guide or monitor treatment for MRSA infections. Performed at [facility], [address] [place], [place] [address]     Coagulation Studies: No results for input(s): LABPROT, INR in the last 72 hours.  Urinalysis: No results for input(s): COLORURINE, LABSPEC, PHURINE, GLUCOSEU, HGBUR, BILIRUBINUR, KETONESUR, PROTEINUR, UROBILINOGEN, NITRITE, LEUKOCYTESUR in the last 72 hours.  Invalid input(s): APPERANCEUR    Imaging: DG Chest 1 View  Result Date: 02/15/2020 CLINICAL DATA:  Shortness of breath EXAM: CHEST  1 VIEW COMPARISON:  February 13, 2020 chest radiograph; chest CT July 01, 2019 FINDINGS: There remains cardiomegaly with pulmonary venous hypertension. There is interstitial edema with small pleural effusions bilaterally. No airspace opacity. Patient is status post coronary artery bypass grafting. No adenopathy. No bone lesions. IMPRESSION:  Cardiomegaly with pulmonary vascular congestion. Interstitial edema and small pleural effusions. The appearance is felt to be indicative of a degree of congestive heart failure. No consolidation. Status post coronary artery bypass grafting. Electronically Signed   By: Lowella Grip III M.D.   On: 02/15/2020 05:36   CARDIAC CATHETERIZATION  Result Date: 02/15/2020 Aborted right and left cardiac catheterization due to respiratory distress and volume overload. We will place the patient on BiPAP, nitroglycerin drip for blood pressure control and diuresis with plans to perform right and left cardiac catheterization once respiratory status improves.  ABORTED INVASIVE LAB PROCEDURE  Result Date: 02/15/2020 Aborted right and left cardiac catheterization due to respiratory distress and volume overload. We will place the patient on BiPAP, nitroglycerin drip for blood pressure control and diuresis with plans to perform right and left cardiac catheterization once respiratory status improves.    Medications:   . furosemide (LASIX) infusion 10 mg/hr (02/16/20 0600)  . nitroGLYCERIN Stopped (02/16/20 0738)   . carvedilol  12.5 mg Oral BID  . Chlorhexidine Gluconate Cloth  6 each Topical Daily  . insulin  aspart  0-15 Units Subcutaneous TID WC  . insulin aspart  0-5 Units Subcutaneous QHS  . isosorbide mononitrate  60 mg Oral Daily  . simvastatin  20 mg Oral q1800   acetaminophen, albuterol, ALPRAZolam, morphine injection, nitroGLYCERIN, nitroGLYCERIN, ondansetron (ZOFRAN) IV, oxyCODONE-acetaminophen  Assessment/ Plan:  64 y.o. male with past medical history of coronary artery disease status post CABG, chronic kidney disease stage IV, diabetes mellitus type 2, chronic diastolic heart failure most recent ejection fraction 50 to 55%, aortic valve stenosis, anemia of chronic kidney disease, secondary hyperparathyroidism who was admitted with chest pain.  1.  Acute kidney injury/chronic kidney disease  stage IV with Volume overload  Baseline EGFR appears to be 19 from 02/08/2020.  EGFR now down to 15.   Patient has normal ejection fraction.  Cardiology recommending right and left heart catheterization.  Patient high risk for contrast-induced nephropathy.  Patient has developed volume overload.  He has peripheral edema as well as pulmonary edema.   Good response to IV Lasix infusion yesterday Recommend to continue 1 more day No acute indication for dialysis at present May be able to switch to oral torsemide tomorrow  2.  Coronary artery disease/unstable angina.   Cardiac cath deferred for now EF 50 to 55%, moderate aortic stenosis    LOS: 3 Amadi Yoshino 7/27/20219:16 AM

## 2020-02-17 ENCOUNTER — Telehealth: Payer: Medicare Other

## 2020-02-17 DIAGNOSIS — I10 Essential (primary) hypertension: Secondary | ICD-10-CM

## 2020-02-17 LAB — BASIC METABOLIC PANEL
Anion gap: 15 (ref 5–15)
BUN: 84 mg/dL — ABNORMAL HIGH (ref 8–23)
CO2: 28 mmol/L (ref 22–32)
Calcium: 8.2 mg/dL — ABNORMAL LOW (ref 8.9–10.3)
Chloride: 94 mmol/L — ABNORMAL LOW (ref 98–111)
Creatinine, Ser: 4.25 mg/dL — ABNORMAL HIGH (ref 0.61–1.24)
GFR calc Af Amer: 16 mL/min — ABNORMAL LOW (ref >60)
GFR calc non Af Amer: 14 mL/min — ABNORMAL LOW (ref >60)
Glucose, Bld: 173 mg/dL — ABNORMAL HIGH (ref 70–99)
Potassium: 4.1 mmol/L (ref 3.5–5.1)
Sodium: 137 mmol/L (ref 135–145)

## 2020-02-17 LAB — CBC
HCT: 32.6 % — ABNORMAL LOW (ref 39.0–52.0)
Hemoglobin: 10.4 g/dL — ABNORMAL LOW (ref 13.0–17.0)
MCH: 31.5 pg (ref 26.0–34.0)
MCHC: 31.9 g/dL (ref 30.0–36.0)
MCV: 98.8 fL (ref 80.0–100.0)
Platelets: 184 10*3/uL (ref 150–400)
RBC: 3.3 MIL/uL — ABNORMAL LOW (ref 4.22–5.81)
RDW: 14.4 % (ref 11.5–15.5)
WBC: 8.5 10*3/uL (ref 4.0–10.5)
nRBC: 0 % (ref 0.0–0.2)

## 2020-02-17 LAB — GLUCOSE, CAPILLARY
Glucose-Capillary: 173 mg/dL — ABNORMAL HIGH (ref 70–99)
Glucose-Capillary: 146 mg/dL — ABNORMAL HIGH (ref 70–99)
Glucose-Capillary: 181 mg/dL — ABNORMAL HIGH (ref 70–99)
Glucose-Capillary: 120 mg/dL — ABNORMAL HIGH (ref 70–99)

## 2020-02-17 MED ORDER — DULOXETINE HCL 30 MG PO CPEP: 60.0000 mg | ORAL_CAPSULE | Freq: Every day | ORAL | Status: DC

## 2020-02-17 MED ORDER — TORSEMIDE 20 MG PO TABS
40.0000 mg | ORAL_TABLET | Freq: Every day | ORAL | Status: DC
  Administered 2020-02-17: 40 mg via ORAL
  Filled 2020-02-17: qty 2

## 2020-02-17 MED ORDER — ASPIRIN 81 MG PO CHEW
81.0000 mg | CHEWABLE_TABLET | Freq: Every day | ORAL | Status: DC
  Administered 2020-02-17 – 2020-02-18 (×2): 81 mg via ORAL
  Filled 2020-02-17 (×2): qty 1

## 2020-02-17 MED ORDER — HEPARIN SODIUM (PORCINE) 5000 UNIT/ML IJ SOLN
5000.0000 [IU] | Freq: Three times a day (TID) | INTRAMUSCULAR | Status: DC
  Administered 2020-02-17 – 2020-02-18 (×2): 5000 [IU] via SUBCUTANEOUS
  Filled 2020-02-17 (×2): qty 1

## 2020-02-17 MED ORDER — AMLODIPINE BESYLATE 5 MG PO TABS
2.5000 mg | ORAL_TABLET | Freq: Every day | ORAL | Status: DC
  Administered 2020-02-17 – 2020-02-18 (×2): 2.5 mg via ORAL
  Filled 2020-02-17 (×2): qty 1

## 2020-02-17 MED ORDER — BUSPIRONE HCL 15 MG PO TABS
15.0000 mg | ORAL_TABLET | Freq: Two times a day (BID) | ORAL | Status: DC
  Administered 2020-02-17 – 2020-02-18 (×2): 15 mg via ORAL
  Filled 2020-02-17 (×3): qty 1

## 2020-02-17 NOTE — Progress Notes (Signed)
Pt is A&OX4. VSS. Sating in upper 90s on 1L by [place]. Denies pain this shift for this RN. PRN xanax for anxiety. Med rec. Performed by pharmacy tech. MD made aware. Report called to RN Danae Chen on 2A. Pt transported via bed.

## 2020-02-17 NOTE — Progress Notes (Signed)
Progress Note  Patient Name: [name] [name] Date of Encounter: 02/17/2020  Primary Cardiologist: [name], MD  Subjective   No c/p or sob.  Good response to lasix gtt, though now creat bumping.  Inpatient Medications    Scheduled Meds: . carvedilol  12.5 mg Oral BID  . Chlorhexidine Gluconate Cloth  6 each Topical Daily  . insulin aspart  0-15 Units Subcutaneous TID WC  . insulin aspart  0-5 Units Subcutaneous QHS  . isosorbide mononitrate  60 mg Oral Daily  . simvastatin  20 mg Oral q1800   Continuous Infusions: . furosemide (LASIX) infusion 10 mg/hr (02/17/20 0739)  . nitroGLYCERIN Stopped (02/16/20 0738)   PRN Meds: acetaminophen, albuterol, ALPRAZolam, morphine injection, nitroGLYCERIN, nitroGLYCERIN, ondansetron (ZOFRAN) IV, oxyCODONE-acetaminophen   Vital Signs    Vitals:   02/17/20 0600 02/17/20 0700 02/17/20 0730 02/17/20 0902  BP: (!) 141/70 (!) 139/76  (!) 152/79  Pulse: 69 59  80  Resp:  17    Temp:   97.8 F (36.6 C)   TempSrc:   Oral   SpO2: 98% 98%    Weight:      Height:        Intake/Output Summary (Last 24 hours) at 02/17/2020 0927 Last data filed at 02/17/2020 0739 Gross per 24 hour  Intake 1061.05 ml  Output 4120 ml  Net -3058.95 ml   Filed Weights   02/15/20 0434 02/16/20 0500 02/17/20 0354  Weight: (!) 107.5 kg (!) 107.7 kg (!) 105.3 kg    Physical Exam   GEN: obese, in no acute distress.  HEENT: Grossly normal.  Neck: Supple, obese, difficult to gauge jvp, no carotid bruits, or masses. Cardiac: RRR, 3/6 SEM throughout - loudest @ upper sternal borders, no rubs, or gallops. No clubbing, cyanosis, edema.  Radials/DP/PT 1+ and equal bilaterally.  Respiratory:  Respirations regular and unlabored, diminished breath sounds bilat. GI: Obese, soft, nontender, nondistended, BS + x 4. MS: no deformity or atrophy. Skin: warm and dry, no rash. Neuro:  Strength and sensation are intact. Psych: AAOx3.  Normal affect.  Labs      Chemistry Recent Labs  Lab 02/13/20 0033 02/14/20 0540 02/15/20 0731 02/16/20 0416 02/17/20 0349  NA 139   < > 136 139 137  K 4.9   < > 4.8 4.6 4.1  CL 105   < > 105 100 94*  CO2 26   < > 22 26 28   GLUCOSE 156*   < > 151* 170* 173*  BUN 33*   < > 51* 62* 84*  CREATININE 3.22*   < > 3.92* 3.98* 4.25*  CALCIUM 8.5*   < > 7.7* 8.6* 8.2*  PROT 7.1  --   --   --   --   ALBUMIN 3.4*  --   --   --   --   AST 18  --   --   --   --   ALT 15  --   --   --   --   ALKPHOS 95  --   --   --   --   BILITOT 0.6  --   --   --   --   GFRNONAA 19*   < > 15* 15* 14*  GFRAA 22*   < > 17* 17* 16*  ANIONGAP 8   < > 9 13 15    < > = values in this interval not displayed.     Hematology Recent Labs  Lab 02/14/20  0540 02/15/20 0731 02/17/20 0349  WBC 7.8 7.4 8.5  RBC 2.88* 3.04* 3.30*  HGB 9.4* 9.8* 10.4*  HCT 28.3* 29.3* 32.6*  MCV 98.3 96.4 98.8  MCH 32.6 32.2 31.5  MCHC 33.2 33.4 31.9  RDW 14.8 14.8 14.4  PLT 124* 139* 184    Cardiac Enzymes  Recent Labs  Lab 02/13/20 0033 02/13/20 0302  TROPONINIHS 403* 470*      BNP Recent Labs  Lab 02/13/20 0033  BNP 713.3*     Lipids  Lab Results  Component Value Date   CHOL 122 02/13/2020   HDL 33 (L) 02/13/2020   LDLCALC 56 02/13/2020   LDLDIRECT 61.0 03/15/2017   TRIG 163 (H) 02/13/2020   CHOLHDL 3.7 02/13/2020    HbA1c  Lab Results  Component Value Date   HGBA1C 5.7 (H) 02/13/2020    Radiology    DG Chest 1 View  Result Date: 02/15/2020 CLINICAL DATA:  Shortness of breath EXAM: CHEST  1 VIEW COMPARISON:  February 13, 2020 chest radiograph; chest CT July 01, 2019 FINDINGS: There remains cardiomegaly with pulmonary venous hypertension. There is interstitial edema with small pleural effusions bilaterally. No airspace opacity. Patient is status post coronary artery bypass grafting. No adenopathy. No bone lesions. IMPRESSION: Cardiomegaly with pulmonary vascular congestion. Interstitial edema and small pleural effusions.  The appearance is felt to be indicative of a degree of congestive heart failure. No consolidation. Status post coronary artery bypass grafting. Electronically Signed   By: Lowella Grip III M.D.   On: 02/15/2020 05:36   DG Sacrum/Coccyx  Result Date: 02/13/2020 CLINICAL DATA:  Fall several months ago injuring tailbone. Persistent pain. EXAM: SACRUM AND COCCYX - 2+ VIEW COMPARISON:  12/20/2011 FINDINGS: No evidence of acute fracture. Mild posterior angulation of the distal aspect of the coccyx unchanged. There are degenerative changes of the spine. Stents over the iliac and femoral arteries bilaterally. IMPRESSION: No acute findings. Electronically Signed   By: Marin Olp M.D.   On: 02/13/2020 16:16   CARDIAC CATHETERIZATION  Result Date: 02/15/2020 Aborted right and left cardiac catheterization due to respiratory distress and volume overload. We will place the patient on BiPAP, nitroglycerin drip for blood pressure control and diuresis with plans to perform right and left cardiac catheterization once respiratory status improves.  ABORTED INVASIVE LAB PROCEDURE  Result Date: 02/15/2020 Aborted right and left cardiac catheterization due to respiratory distress and volume overload. We will place the patient on BiPAP, nitroglycerin drip for blood pressure control and diuresis with plans to perform right and left cardiac catheterization once respiratory status improves.   Telemetry    RSR, 60's - Personally Reviewed  Cardiac Studies   2D Echocardiogram 6.21.2021  1. Left ventricular ejection fraction, by estimation, is 50 to 55%. The  left ventricle has low normal function. The left ventricle has no regional  wall motion abnormalities. There is mild left ventricular hypertrophy.  Left ventricular diastolic  parameters are consistent with Grade II diastolic dysfunction  (pseudonormalization).   2. Right ventricular systolic function is normal. The right ventricular  size is normal.  There is moderately elevated pulmonary artery systolic  pressure.   3. Left atrial size was mildly dilated.   4. The mitral valve is abnormal. Mild to moderate mitral valve  regurgitation. No evidence of mitral stenosis.   5. The aortic valve is abnormal. Aortic valve regurgitation is not  visualized. Moderate aortic valve stenosis. Aortic valve area, by VTI  measures 1.06 cm. Aortic valve mean gradient  measures 26.0 mmHg.   6. Aortic dilatation noted. There is mild dilatation of the ascending  aorta measuring 41 mm.   7. The inferior vena cava is normal in size with <50% respiratory  variability, suggesting right atrial pressure of 8 mmHg.  _____________   Attempted Cardiac Catheterization 7.26.2021  Aborted right and left cardiac catheterization due to respiratory distress and volume overload.   We will place the patient on BiPAP, nitroglycerin drip for blood pressure control and diuresis with plans to perform right and left cardiac catheterization once respiratory status improves. _____________    Patient Profile     64 y.o. male with history of CAD status post CABG in 2012, moderate aortic stenosis, carotid artery disease status post left-sided CEA, DM2, CKD stage IV, anemia of chronic disease, HTN, HLD, PAD status post extensive lower extremity revascularization by vascular surgery in 05/2019, tobacco use, obesity, and psoriasis, who was admitted 7/24 w/ NSTEMI and developed resp failure.  Assessment & Plan    1.  NSTEMI/CAD:  S/p CABG in 2012.  Admitted w/ 7/24 w/ several week h/o intermittent L chest pain and dyspnea.  HsT 403  470.  Cath attempted 7/26, but developed resp failure resulting in abortion of procedure.  EF 50-55% by echo, Mod AS.  No longer requiring BiPAP.  No c/p or sob overnight/this AM.  Creat higher this AM @ 4.25 on lasix gtt.  Meredith Pel, not a candidate for cath at this time.  Cont asa,  blocker, statin, and nitrate rx.  2.  Acute on chronic stage IV kidney  dzs:  Creat bumping on lasix gtt.  Defer to nephrology for diuretic mgmt.  3.  Acute on chronic HFpEF w/ PAH:  EF 50-55%.  Good response to lasix gtt.  Minus 3.1 L yesterday and 9.1L since admission.  Wt down to 105.3 kg from 107.7 on admission.  No obvious volume overload but exam challenging 2/2 body habitus.  Wt more or less in line w/ previous wts w/ exception of two self reported wts earlier this month.  HR stable.  BP trending mostly 140's to 150's.  Cont current dose of  blocker and nitrate.  Will add amlodipine.  Diuretic mgmt per nephrology.  4.  Essential HTN: Pressure trending 140's to 150's for the most part. Cont  blocker and nitrate.  Will add amlodipine.  5.  HL:  LDL 56.  Cont statin rx.  6.  Anemia of chronic dzs:  Signed, Murray Hodgkins, NP  02/17/2020, 9:27 AM    For questions or updates, please contact   Please consult [email or web address] for contact info under Cardiology/STEMI.

## 2020-02-17 NOTE — Progress Notes (Signed)
Foley removed per MD order, pt tolerated well. Pt resting in bed comfortably. Will continue to monitor.

## 2020-02-17 NOTE — Progress Notes (Signed)
[facility]  ROUNDING NOTE   Subjective:  Patient well-known to [place] as we follow him for chronic kidney disease stage IV. Comes in with angina and chest pain. Patient was unable to undergo cardiac catheterization because he could not lay flat  This morning he feels well.  Sitting up in the bed eating breakfast.  Able to bradycardia.  Oxygen sat on 1 L/min.  No leg edema.  No nausea or vomiting reported.  Objective:  Vital signs in last 24 hours:  Temp:  [97.2 F (36.2 C)-98.4 F (36.9 C)] 97.8 F (36.6 C) (07/28 0730) Pulse Rate:  [39-112] 80 (07/28 0902) Resp:  [15-22] 17 (07/28 0700) BP: (123-188)/(60-129) 152/79 (07/28 0902) SpO2:  [64 %-100 %] 98 % (07/28 0700) Weight:  [105.3 kg] 105.3 kg (07/28 0354)  Weight change: -2.4 kg Filed Weights   02/15/20 0434 02/16/20 0500 02/17/20 0354  Weight: (!) 107.5 kg (!) 107.7 kg (!) 105.3 kg    Intake/Output: I/O last 3 completed shifts: In: 1599.8 [P.O.:1200; I.V.:399.8] Out: 6350 [Urine:6350]   Intake/Output this shift:  Total I/O In: 16.5 [I.V.:16.5] Out: 220 [Urine:220]  Physical Exam: General: NAD  Head: Normocephalic, atraumatic. Moist oral mucosal membranes  Eyes: Anicteric  Lungs:   Decreased breath sounds at bases, Peggs O2, otherwise clear  Heart: O1H0 2/6 systolic ejection murmur  Abdomen:  Soft, nontender, bowel sounds present  Extremities: trace peripheral edema.  Neurologic:  Alert and oriented  Skin: No lesions       Basic Metabolic Panel: Recent Labs  Lab 02/13/20 0033 02/13/20 0033 02/14/20 0540 02/14/20 0540 02/15/20 0731 02/16/20 0416 02/17/20 0349  NA 139  --  136  --  136 139 137  K 4.9  --  4.7  --  4.8 4.6 4.1  CL 105  --  104  --  105 100 94*  CO2 26  --  26  --  22 26 28   GLUCOSE 156*  --  144*  --  151* 170* 173*  BUN 33*  --  44*  --  51* 62* 84*  CREATININE 3.22*  --  3.86*  --  3.92* 3.98* 4.25*  CALCIUM 8.5*   < > 8.0*   < > 7.7* 8.6* 8.2*   < > = values in this  interval not displayed.    Liver Function Tests: Recent Labs  Lab 02/13/20 0033  AST 18  ALT 15  ALKPHOS 95  BILITOT 0.6  PROT 7.1  ALBUMIN 3.4*   Recent Labs  Lab 02/13/20 0033  LIPASE 25   No results for input(s): AMMONIA in the last 168 hours.  CBC: Recent Labs  Lab 02/13/20 0033 02/14/20 0540 02/15/20 0731 02/17/20 0349  WBC 10.5 7.8 7.4 8.5  HGB 11.0* 9.4* 9.8* 10.4*  HCT 35.0* 28.3* 29.3* 32.6*  MCV 99.7 98.3 96.4 98.8  PLT 153 124* 139* 184    Cardiac Enzymes: No results for input(s): CKTOTAL, CKMB, CKMBINDEX, TROPONINI in the last 168 hours.  BNP: Invalid input(s): POCBNP  CBG: Recent Labs  Lab 02/16/20 1138 02/16/20 1710 02/16/20 1931 02/16/20 2128 02/17/20 0717  GLUCAP 184* 168* 327* 141* 173*    Microbiology: Results for orders placed or performed during the hospital encounter of 02/13/20  SARS Coronavirus 2 by RT PCR (hospital order, performed in [facility] hospital lab) Nasopharyngeal Nasopharyngeal Swab     Status: None   Collection Time: 02/13/20  3:01 AM   Specimen: Nasopharyngeal Swab  Result Value Ref Range Status  SARS Coronavirus 2 NEGATIVE NEGATIVE Final    Comment: (NOTE) SARS-CoV-2 target nucleic acids are NOT DETECTED.  The SARS-CoV-2 RNA is generally detectable in upper and lower respiratory specimens during the acute phase of infection. The lowest concentration of SARS-CoV-2 viral copies this assay can detect is 250 copies / mL. A negative result does not preclude SARS-CoV-2 infection and should not be used as the sole basis for treatment or other patient management decisions.  A negative result may occur with improper specimen collection / handling, submission of specimen other than nasopharyngeal swab, presence of viral mutation(s) within the areas targeted by this assay, and inadequate number of viral copies (<250 copies / mL). A negative result must be combined with clinical observations, patient history, and  epidemiological information.  Fact Sheet for Patients:   StrictlyIdeas.no  Fact Sheet for Healthcare Providers: BankingDealers.co.za  This test is not yet approved or  cleared by the [place] FDA and has been authorized for detection and/or diagnosis of SARS-CoV-2 by FDA under an Emergency Use Authorization (EUA).  This EUA will remain in effect (meaning this test can be used) for the duration of the COVID-19 declaration under Section 564(b)(1) of the Act, 21 U.S.C. section 360bbb-3(b)(1), unless the authorization is terminated or revoked sooner.  Performed at [facility], [address] [place], [place] [address]   MRSA PCR Screening     Status: None   Collection Time: 02/15/20  1:37 PM   Specimen: Nasal Mucosa; Nasopharyngeal  Result Value Ref Range Status   MRSA by PCR NEGATIVE NEGATIVE Final    Comment:        The GeneXpert MRSA Assay (FDA approved for NASAL specimens only), is one component of a comprehensive MRSA colonization surveillance program. It is not intended to diagnose MRSA infection nor to guide or monitor treatment for MRSA infections. Performed at [facility], [address] [place], [place] [address]     Coagulation Studies: No results for input(s): LABPROT, INR in the last 72 hours.  Urinalysis: No results for input(s): COLORURINE, LABSPEC, PHURINE, GLUCOSEU, HGBUR, BILIRUBINUR, KETONESUR, PROTEINUR, UROBILINOGEN, NITRITE, LEUKOCYTESUR in the last 72 hours.  Invalid input(s): APPERANCEUR    Imaging: CARDIAC CATHETERIZATION  Result Date: 02/15/2020 Aborted right and left cardiac catheterization due to respiratory distress and volume overload. We will place the patient on BiPAP, nitroglycerin drip for blood pressure control and diuresis with plans to perform right and left cardiac catheterization once respiratory status improves.  ABORTED INVASIVE LAB PROCEDURE  Result  Date: 02/15/2020 Aborted right and left cardiac catheterization due to respiratory distress and volume overload. We will place the patient on BiPAP, nitroglycerin drip for blood pressure control and diuresis with plans to perform right and left cardiac catheterization once respiratory status improves.    Medications:    furosemide (LASIX) infusion 10 mg/hr (02/17/20 0739)   nitroGLYCERIN Stopped (02/16/20 0738)    carvedilol  12.5 mg Oral BID   Chlorhexidine Gluconate Cloth  6 each Topical Daily   insulin aspart  0-15 Units Subcutaneous TID WC   insulin aspart  0-5 Units Subcutaneous QHS   isosorbide mononitrate  60 mg Oral Daily   simvastatin  20 mg Oral q1800   acetaminophen, albuterol, ALPRAZolam, morphine injection, nitroGLYCERIN, nitroGLYCERIN, ondansetron (ZOFRAN) IV, oxyCODONE-acetaminophen  Assessment/ Plan:  64 y.o. male with past medical history of coronary artery disease status post CABG, chronic kidney disease stage IV, diabetes mellitus type 2, chronic diastolic heart failure most recent ejection fraction 50 to  55%, aortic valve stenosis, anemia of chronic kidney disease, secondary hyperparathyroidism who was admitted with chest pain.  1.  Acute kidney injury/chronic kidney disease stage IV with Volume overload  Baseline EGFR appears to be 19 from 02/08/2020.  EGFR now down to 14 07/27 0701 - 07/28 0700 In: 1444.6 [P.O.:1200; I.V.:244.6] Out: 4550 [Urine:4550] Lab Results  Component Value Date   CREATININE 4.25 (H) 02/17/2020   CREATININE 3.98 (H) 02/16/2020   CREATININE 3.92 (H) 02/15/2020     Patient has normal ejection fraction.  Cardiology recommending right and left heart catheterization.  Patient high risk for contrast-induced nephropathy.   Poor response to IV furosemide.  Urine output of 4500 cc.  Breathing comfortably and appears euvolemic now.  Serum creatinine is worsening.  Will change furosemide IV infusion to oral torsemide for  maintenance. Monitor renal function over the next few days to see if there is renal recovery Electrolytes and volume status is acceptable.  No acute indication for dialysis at present.  We will continue to reevaluate daily.   2.  Coronary artery disease/unstable angina.   Cardiac cath deferred for now EF 50 to 55%, moderate aortic stenosis    LOS: 4 Alek Poncedeleon 7/28/20219:14 AM

## 2020-02-17 NOTE — Progress Notes (Signed)
  Patient transferred to unit from ccu. Oriented to room, call bell, and staff. Bed in lowest position. Fall safety plan reviewed. Full assessment to Epic. Telemetry box verification with tele clerk- Box#:29. Will continue to monitor.

## 2020-02-17 NOTE — Progress Notes (Signed)
PROGRESS NOTE    [name] [name]  IDH:[id number] DOB: 1955-10-23 DOA: 02/13/2020 PCP: [name], MD   Chief complaint.  Shortness of breath  Brief Narrative:  [name] [name] is a 64 y.o. malewith medical history significant forCAD status post CABG, CKD 4, diabetes. Patient presented secondary to chest pain and shortness of breath with evidence of NSTEMI on admission. Heparin drip started. Cardiology consulted.   Patient was seen by nephrology, due to worsening renal function, patient was started on Lasix drip.  7/28.  Patient volume status much improved.  Renal function slightly worse.  Switched diuretics to torsemide 40 mg daily.      Assessment & Plan:   Principal Problem:   NSTEMI (non-ST elevated myocardial infarction) ([place]) Active Problems:   Coronary artery disease   Type 2 diabetes mellitus with diabetic peripheral angiopathy without gangrene ([place])   CKD stage 4 due to type 2 diabetes mellitus (HCC)   Moderate aortic valve stenosis   Acute diastolic CHF (congestive heart failure) ([place])   Hypertensive urgency   Chronic kidney disease  #1.  Non-STEMI. Appreciate cardiology consult.  Continue current treatment.  #2.  Acute on chronic diastolic congestive heart failure. Patient was on Lasix drip and is switched to torsemide 40 mg daily today.  Volume status much improved.  Renal function started worsening today, will recheck a BMP tomorrow.  3.  Chronic kidney disease stage IV. Worsening renal function today after Lasix.  Recheck a BMP tomorrow after discontinuation of Lasix drip.  4.  Moderate aortic stenosis. Follow.  5.  Anemia of chronic disease. Stable.  6.  Essential hypertension. Continue coreg     DVT prophylaxis: Heparin Code Status: Full Family Communication: None Disposition Plan:  . Patient came from:            . Anticipated d/c place: . Barriers to d/c OR conditions which need to be met to effect a safe d/c:   Consultants:    Cardiology and nephrology  Procedures: None Antimicrobials:None  Subjective: Patient feels much better today, no significant short of breath.  No cough. He made significant amount of urine over last 24 hours. No fever chills.  Objective: Vitals:   02/17/20 1100 02/17/20 1200 02/17/20 1300 02/17/20 1400  BP: (!) 137/71 (!) 152/64 (!) 144/72 121/83  Pulse: 63 70 67   Resp: _0 Temp:  98 F (36.7 C)    TempSrc:  Oral    SpO2: 95% 100% 98%   Weight:      Height:        Intake/Output Summary (Last 24 hours) at 02/17/2020 1518 Last data filed at 02/17/2020 1238 Gross per 24 hour  Intake 685.57 ml  Output 4795 ml  Net -4109.43 ml   Filed Weights   02/15/20 0434 02/16/20 0500 02/17/20 0354  Weight: (!) 107.5 kg (!) 107.7 kg (!) 105.3 kg    Examination:  General exam: Appears calm and comfortable  Respiratory system: Clear to auscultation. Respiratory effort normal. Cardiovascular system: S1 & S2 heard, RRR. No JVD, murmurs, rubs, gallops or clicks. No pedal edema. Gastrointestinal system: Abdomen is nondistended, soft and nontender. No organomegaly or masses felt. Normal bowel sounds heard. Central nervous system: Alert and oriented. No focal neurological deficits. Extremities: Symmetric 5 x 5 power. Skin: No rashes, lesions or ulcers Psychiatry: Judgement and insight appear normal. Mood & affect appropriate.     Data Reviewed: I have personally reviewed following labs and imaging studies  CBC:  Recent Labs  Lab 02/13/20 0033 02/14/20 0540 02/15/20 0731 02/17/20 0349  WBC 10.5 7.8 7.4 8.5  HGB 11.0* 9.4* 9.8* 10.4*  HCT 35.0* 28.3* 29.3* 32.6*  MCV 99.7 98.3 96.4 98.8  PLT 153 124* 139* 284   Basic Metabolic Panel: Recent Labs  Lab 02/13/20 0033 02/14/20 0540 02/15/20 0731 02/16/20 0416 02/17/20 0349  NA 139 136 136 139 137  K 4.9 4.7 4.8 4.6 4.1  CL 105 104 105 100 94*  CO2 _0 GLUCOSE 156* 144* 151* 170* 173*  BUN 33* 44*  51* 62* 84*  CREATININE 3.22* 3.86* 3.92* 3.98* 4.25*  CALCIUM 8.5* 8.0* 7.7* 8.6* 8.2*   GFR: Estimated Creatinine Clearance: 19.8 mL/min (A) (by C-G formula based on SCr of 4.25 mg/dL (H)). Liver Function Tests: Recent Labs  Lab 02/13/20 0033  AST 18  ALT 15  ALKPHOS 95  BILITOT 0.6  PROT 7.1  ALBUMIN 3.4*   Recent Labs  Lab 02/13/20 0033  LIPASE 25   No results for input(s): AMMONIA in the last 168 hours. Coagulation Profile: Recent Labs  Lab 02/13/20 0033  INR 1.0   Cardiac Enzymes: No results for input(s): CKTOTAL, CKMB, CKMBINDEX, TROPONINI in the last 168 hours. BNP (last 3 results) No results for input(s): PROBNP in the last 8760 hours. HbA1C: No results for input(s): HGBA1C in the last 72 hours. CBG: Recent Labs  Lab 02/16/20 1710 02/16/20 1931 02/16/20 2128 02/17/20 0717 02/17/20 1121  GLUCAP 168* 327* 141* 173* 146*   Lipid Profile: No results for input(s): CHOL, HDL, LDLCALC, TRIG, CHOLHDL, LDLDIRECT in the last 72 hours. Thyroid Function Tests: No results for input(s): TSH, T4TOTAL, FREET4, T3FREE, THYROIDAB in the last 72 hours. Anemia Panel: No results for input(s): VITAMINB12, FOLATE, FERRITIN, TIBC, IRON, RETICCTPCT in the last 72 hours. Sepsis Labs: No results for input(s): PROCALCITON, LATICACIDVEN in the last 168 hours.  Recent Results (from the past 240 hour(s))  SARS Coronavirus 2 by RT PCR (hospital order, performed in [facility] hospital lab) Nasopharyngeal Nasopharyngeal Swab     Status: None   Collection Time: 02/13/20  3:01 AM   Specimen: Nasopharyngeal Swab  Result Value Ref Range Status   SARS Coronavirus 2 NEGATIVE NEGATIVE Final    Comment: (NOTE) SARS-CoV-2 target nucleic acids are NOT DETECTED.  The SARS-CoV-2 RNA is generally detectable in upper and lower respiratory specimens during the acute phase of infection. The lowest concentration of SARS-CoV-2 viral copies this assay can detect is 250 copies / mL. A negative  result does not preclude SARS-CoV-2 infection and should not be used as the sole basis for treatment or other patient management decisions.  A negative result may occur with improper specimen collection / handling, submission of specimen other than nasopharyngeal swab, presence of viral mutation(s) within the areas targeted by this assay, and inadequate number of viral copies (<250 copies / mL). A negative result must be combined with clinical observations, patient history, and epidemiological information.  Fact Sheet for Patients:   StrictlyIdeas.no  Fact Sheet for Healthcare Providers: BankingDealers.co.za  This test is not yet approved or  cleared by the [place] FDA and has been authorized for detection and/or diagnosis of SARS-CoV-2 by FDA under an Emergency Use Authorization (EUA).  This EUA will remain in effect (meaning this test can be used) for the duration of the COVID-19 declaration under Section 564(b)(1) of the Act, 21 U.S.C. section 360bbb-3(b)(1), unless the authorization is terminated or revoked sooner.  Performed at [facility], [address] [place], [place] [address]   MRSA PCR Screening     Status: None   Collection Time: 02/15/20  1:37 PM   Specimen: Nasal Mucosa; Nasopharyngeal  Result Value Ref Range Status   MRSA by PCR NEGATIVE NEGATIVE Final    Comment:        The GeneXpert MRSA Assay (FDA approved for NASAL specimens only), is one component of a comprehensive MRSA colonization surveillance program. It is not intended to diagnose MRSA infection nor to guide or monitor treatment for MRSA infections. Performed at [facility], [address] [place], [place] [address]          Radiology Studies: No results found.      Scheduled Meds: . amLODipine  2.5 mg Oral Daily  . aspirin  81 mg Oral Daily  . carvedilol  12.5 mg Oral BID  . Chlorhexidine Gluconate Cloth   6 each Topical Daily  . insulin aspart  0-15 Units Subcutaneous TID WC  . insulin aspart  0-5 Units Subcutaneous QHS  . isosorbide mononitrate  60 mg Oral Daily  . simvastatin  20 mg Oral q1800  . torsemide  40 mg Oral Daily   Continuous Infusions: . nitroGLYCERIN Stopped (02/16/20 0738)     LOS: 4 days    Time spent: 28 minutes    Sharen Hones, MD [facility]   To contact the attending provider between 7A-7P or the covering provider during after hours 7P-7A, please log into the web site [email or web address] and access using universal [place] password for that web site. If you do not have the password, please call the hospital operator.  02/17/2020, 3:18 PM

## 2020-02-18 ENCOUNTER — Telehealth: Payer: Self-pay

## 2020-02-18 DIAGNOSIS — I10 Essential (primary) hypertension: Secondary | ICD-10-CM | POA: Diagnosis not present

## 2020-02-18 DIAGNOSIS — I2510 Atherosclerotic heart disease of native coronary artery without angina pectoris: Secondary | ICD-10-CM | POA: Diagnosis not present

## 2020-02-18 DIAGNOSIS — N184 Chronic kidney disease, stage 4 (severe): Secondary | ICD-10-CM | POA: Diagnosis not present

## 2020-02-18 DIAGNOSIS — R21 Rash and other nonspecific skin eruption: Secondary | ICD-10-CM | POA: Diagnosis not present

## 2020-02-18 DIAGNOSIS — I5033 Acute on chronic diastolic (congestive) heart failure: Secondary | ICD-10-CM | POA: Diagnosis not present

## 2020-02-18 DIAGNOSIS — E1122 Type 2 diabetes mellitus with diabetic chronic kidney disease: Secondary | ICD-10-CM | POA: Diagnosis not present

## 2020-02-18 DIAGNOSIS — I214 Non-ST elevation (NSTEMI) myocardial infarction: Secondary | ICD-10-CM | POA: Diagnosis not present

## 2020-02-18 DIAGNOSIS — E1151 Type 2 diabetes mellitus with diabetic peripheral angiopathy without gangrene: Secondary | ICD-10-CM | POA: Diagnosis not present

## 2020-02-18 LAB — BASIC METABOLIC PANEL WITH GFR
Anion gap: 15 (ref 5–15)
BUN: 88 mg/dL — ABNORMAL HIGH (ref 8–23)
CO2: 26 mmol/L (ref 22–32)
Calcium: 7.8 mg/dL — ABNORMAL LOW (ref 8.9–10.3)
Chloride: 93 mmol/L — ABNORMAL LOW (ref 98–111)
Creatinine, Ser: 4.41 mg/dL — ABNORMAL HIGH (ref 0.61–1.24)
GFR calc Af Amer: 15 mL/min — ABNORMAL LOW
GFR calc non Af Amer: 13 mL/min — ABNORMAL LOW
Glucose, Bld: 153 mg/dL — ABNORMAL HIGH (ref 70–99)
Potassium: 4.2 mmol/L (ref 3.5–5.1)
Sodium: 134 mmol/L — ABNORMAL LOW (ref 135–145)

## 2020-02-18 LAB — GLUCOSE, CAPILLARY
Glucose-Capillary: 151 mg/dL — ABNORMAL HIGH (ref 70–99)
Glucose-Capillary: 155 mg/dL — ABNORMAL HIGH (ref 70–99)

## 2020-02-18 LAB — MAGNESIUM: Magnesium: 1.8 mg/dL (ref 1.7–2.4)

## 2020-02-18 MED ORDER — ISOSORBIDE MONONITRATE ER 60 MG PO TB24: 60.0000 mg | ORAL_TABLET | Freq: Every day | ORAL | 0 refills | Status: DC

## 2020-02-18 MED ORDER — AMLODIPINE BESYLATE 2.5 MG PO TABS: 2.5000 mg | ORAL_TABLET | Freq: Every day | ORAL | 0 refills | Status: DC

## 2020-02-18 MED ORDER — TORSEMIDE 20 MG PO TABS: 40.0000 mg | ORAL_TABLET | Freq: Every day | ORAL | 0 refills | Status: DC

## 2020-02-18 NOTE — Care Management Important Message (Signed)
Important Message  Patient Details  Name: [name] [name] MRN: [id number] Date of Birth: 1955-09-06   Medicare Important Message Given:  No  Patient discharged prior to arrival to unit to deliver concurrent Medicare IM.   [name] 02/18/2020, 1:37 PM

## 2020-02-18 NOTE — TOC Initial Note (Signed)
Transition of Care [facility]) - Initial/Assessment Note    Patient Details  Name: [name] [name] MRN: [id number] Date of Birth: 1956/02/04  Transition of Care [facility]) CM/SW Contact:    Eileen Stanford, LCSW Phone Number: 02/18/2020, 10:59 AM  Clinical Narrative: CSW spoke with pt at beside to complete heart failure screen and readmission risk screening. Pt lives with his sister her husband and 66 year old son. Pt states he drives himself to appointments. Pt has established PCP and pharmacy. Pt states he has a scale at home and states he weighs himself daily. Pt is aware if there is weight gain to notify his doctor. Pt has a Copywriter, advertising. No additional needs at this time. Pt will d/c home today.                  Expected Discharge Plan: Home/Self Care Barriers to Discharge: No Barriers Identified   Patient Goals and CMS Choice Patient states their goals for this hospitalization and ongoing recovery are:: to get stronger      Expected Discharge Plan and Services Expected Discharge Plan: Home/Self Care In-house Referral: Clinical Social Work   Post Acute Care Choice: NA Living arrangements for the past 2 months: Single Family Home Expected Discharge Date: 02/18/20                         [facility] Arranged: NA          Prior Living Arrangements/Services Living arrangements for the past 2 months: Single Family Home Lives with:: Siblings, Relatives Patient language and need for interpreter reviewed:: Yes Do you feel safe going back to the place where you live?: Yes      Need for Family Participation in Patient Care: Yes (Comment) Care giver support system in place?: Yes (comment)   Criminal Activity/Legal Involvement Pertinent to Current Situation/Hospitalization: No - Comment as needed  Activities of Daily Living Home Assistive Devices/Equipment: Cane (specify quad or straight), Wheelchair, Environmental consultant (specify type) ADL Screening (condition at time of admission) Patient's  cognitive ability adequate to safely complete daily activities?: Yes Is the patient deaf or have difficulty hearing?: No Does the patient have difficulty seeing, even when wearing glasses/contacts?: No Does the patient have difficulty concentrating, remembering, or making decisions?: No Patient able to express need for assistance with ADLs?: Yes Does the patient have difficulty dressing or bathing?: No Independently performs ADLs?: Yes (appropriate for developmental age) Does the patient have difficulty walking or climbing stairs?: No Weakness of Legs: None Weakness of Arms/Hands: None  Permission Sought/Granted Permission sought to share information with : Family Supports    Share Information with NAME: Waunita Schooner     Permission granted to share info w Relationship: Relative     Emotional Assessment Appearance:: Appears stated age Attitude/Demeanor/Rapport: Engaged Affect (typically observed): Accepting, Appropriate Orientation: : Oriented to Self, Oriented to Place, Oriented to  Time, Oriented to Situation Alcohol / Substance Use: Not Applicable Psych Involvement: No (comment)  Admission diagnosis:  NSTEMI (non-ST elevated myocardial infarction) ([place]) [I21.4] Chronic kidney disease, unspecified CKD stage [N18.9] Acute on chronic congestive heart failure, unspecified heart failure type [facility]) [I50.9] Patient Active Problem List   Diagnosis Date Noted  . Chronic kidney disease   . NSTEMI (non-ST elevated myocardial infarction) ([place]) 02/13/2020  . Moderate aortic valve stenosis 02/13/2020  . Acute diastolic CHF (congestive heart failure) ([place]) 02/13/2020  . Hypertensive urgency 02/13/2020  . Fall with injury 11/16/2019  . Leg weakness, bilateral 11/16/2019  .  Benign hypertensive kidney disease with chronic kidney disease 09/02/2019  . Proteinuria 09/02/2019  . Secondary hyperparathyroidism of renal origin ([place]) 09/02/2019  . Left arm pain 02/11/2019  . Gastroesophageal reflux  03/25/2018  . Atherosclerosis of aorta ([place]) 09/04/2017  . Thoracic aortic aneurysm ([place]) 09/04/2017  . Right thyroid nodule 04/12/2017  . Thrombocytopenia ([place]) 03/16/2017  . CKD stage 4 due to type 2 diabetes mellitus ([place]) 03/15/2017  . Advance directive discussed with patient 03/15/2017  . BMI 40.0-44.9, adult ([place]) 09/12/2016  . Psoriatic arthritis ([place])   . Lung nodule < 6cm on CT 03/29/2016  . COPD with acute bronchitis ([place]) 03/29/2016  . Headache 09/14/2015  . Constipation 10/05/2014  . PVD (peripheral vascular disease) ([place]) 04/23/2014  . Memory loss 04/23/2014  . Aortic valve disorder 01/11/2014  . Preventative health care 11/23/2013  . Benign prostatic hypertrophy without urinary obstruction 03/25/2013  . Enlarged prostate without lower urinary tract symptoms (luts) 03/25/2013  . Peripheral vascular disease due to secondary diabetes mellitus ([place]) 03/25/2013  . Obesity 02/12/2013  . Coronary atherosclerosis 06/06/2012  . Nonrheumatic aortic valve stenosis   . Low back pain 12/27/2011  . MDD (major depressive disorder), recurrent episode ([place]) 05/28/2011  . Carotid artery disease ([place]) 04/17/2011  . Occlusion and stenosis of carotid artery 04/17/2011  . Obstructive sleep apnea 03/05/2011  . Psoriasis 03/05/2011  . Neuropathy ([place]) 03/05/2011  . Type 2 diabetes mellitus with diabetic peripheral angiopathy without gangrene ([place]) 03/05/2011  . Polyneuropathy 03/05/2011  . Coronary artery disease   . Hyperlipidemia, mixed   . Essential hypertension    PCP:  [name], MD Pharmacy:   [facility], [place] [address] Phone: [phone] Fax: [phone]  [facility] - [place], [place] - [address] [place] [address] Phone: [phone] Fax: [phone]  [facility] [place], [place] [facility] [address] AT [place] [facility] [place] [address] Phone: [phone] Fax: [phone]     Social Determinants of Health (SDOH) Interventions    Readmission Risk Interventions Readmission Risk Prevention Plan 02/18/2020  PCP or Specialist Appt within 3-5 Days Complete  HRI or [place] Complete  Social Work Consult for [place] Planning/Counseling Complete  Palliative Care Screening Not Applicable  Medication Review Press photographer) Complete  Some recent data might be hidden

## 2020-02-18 NOTE — Progress Notes (Signed)
Progress Note  Patient Name: [name] [name] Date of Encounter: 02/18/2020  Primary Cardiologist: [name], MD  Subjective   No chest pain, sob.  Eager to go home.  Says he'll leave AMA if not discharged today.  Inpatient Medications    Scheduled Meds:  amLODipine  2.5 mg Oral Daily   aspirin  81 mg Oral Daily   busPIRone  15 mg Oral BID   carvedilol  12.5 mg Oral BID   Chlorhexidine Gluconate Cloth  6 each Topical Daily   heparin  5,000 Units Subcutaneous Q8H   insulin aspart  0-15 Units Subcutaneous TID WC   insulin aspart  0-5 Units Subcutaneous QHS   isosorbide mononitrate  60 mg Oral Daily   simvastatin  20 mg Oral q1800   Continuous Infusions:  PRN Meds: acetaminophen, albuterol, ALPRAZolam, morphine injection, nitroGLYCERIN, nitroGLYCERIN, ondansetron (ZOFRAN) IV, oxyCODONE-acetaminophen   Vital Signs    Vitals:   02/17/20 1932 02/18/20 0538 02/18/20 0613 02/18/20 0723  BP: (!) 145/60 (!) 163/77  (!) 140/89  Pulse: 76 76  81  Resp:    18  Temp: 98.1 F (36.7 C) 98 F (36.7 C)  98 F (36.7 C)  TempSrc:    Oral  SpO2: 96% 96%  99%  Weight:   (!) 100.3 kg   Height:        Intake/Output Summary (Last 24 hours) at 02/18/2020 0937 Last data filed at 02/18/2020 0005 Gross per 24 hour  Intake 744.52 ml  Output 2275 ml  Net -1530.48 ml   Filed Weights   02/16/20 0500 02/17/20 0354 02/18/20 0613  Weight: (!) 107.7 kg (!) 105.3 kg (!) 100.3 kg    Physical Exam   GEN: Obese, in no acute distress.  HEENT: Grossly normal.  Neck: Supple, obese, difficult to gauge JVP 2/2 girth, No carotid bruits, or masses. Cardiac: RRR, 3/6 SEM throughout, loudest @ upper sternal borders.  No rubs, or gallops. No clubbing, cyanosis, edema.  Radials/DP/PT 1+ and equal bilaterally.  Respiratory:  Respirations regular and unlabored, clear to auscultation bilaterally. GI: Obese, Soft, nontender, nondistended, BS + x 4. MS: no deformity or atrophy. Skin: warm  and dry, no rash. Neuro:  Strength and sensation are intact. Psych: AAOx3.  Normal affect.  Labs    Chemistry Recent Labs  Lab 02/13/20 0033 02/14/20 0540 02/16/20 0416 02/17/20 0349 02/18/20 0328  NA 139   < > 139 137 134*  K 4.9   < > 4.6 4.1 4.2  CL 105   < > 100 94* 93*  CO2 26   < > 26 28 26   GLUCOSE 156*   < > 170* 173* 153*  BUN 33*   < > 62* 84* 88*  CREATININE 3.22*   < > 3.98* 4.25* 4.41*  CALCIUM 8.5*   < > 8.6* 8.2* 7.8*  PROT 7.1  --   --   --   --   ALBUMIN 3.4*  --   --   --   --   AST 18  --   --   --   --   ALT 15  --   --   --   --   ALKPHOS 95  --   --   --   --   BILITOT 0.6  --   --   --   --   GFRNONAA 19*   < > 15* 14* 13*  GFRAA 22*   < > 17* 16* 15*  ANIONGAP 8   < > 13 15 15    < > = values in this interval not displayed.     Hematology Recent Labs  Lab 02/14/20 0540 02/15/20 0731 02/17/20 0349  WBC 7.8 7.4 8.5  RBC 2.88* 3.04* 3.30*  HGB 9.4* 9.8* 10.4*  HCT 28.3* 29.3* 32.6*  MCV 98.3 96.4 98.8  MCH 32.6 32.2 31.5  MCHC 33.2 33.4 31.9  RDW 14.8 14.8 14.4  PLT 124* 139* 184    Cardiac Enzymes  Recent Labs  Lab 02/13/20 0033 02/13/20 0302  TROPONINIHS 403* 470*      BNP Recent Labs  Lab 02/13/20 0033  BNP 713.3*     Lipids  Lab Results  Component Value Date   CHOL 122 02/13/2020   HDL 33 (L) 02/13/2020   LDLCALC 56 02/13/2020   LDLDIRECT 61.0 03/15/2017   TRIG 163 (H) 02/13/2020   CHOLHDL 3.7 02/13/2020    HbA1c  Lab Results  Component Value Date   HGBA1C 5.7 (H) 02/13/2020    Radiology    DG Chest 1 View  Result Date: 02/15/2020 CLINICAL DATA:  Shortness of breath EXAM: CHEST  1 VIEW COMPARISON:  February 13, 2020 chest radiograph; chest CT July 01, 2019 FINDINGS: There remains cardiomegaly with pulmonary venous hypertension. There is interstitial edema with small pleural effusions bilaterally. No airspace opacity. Patient is status post coronary artery bypass grafting. No adenopathy. No bone lesions.  IMPRESSION: Cardiomegaly with pulmonary vascular congestion. Interstitial edema and small pleural effusions. The appearance is felt to be indicative of a degree of congestive heart failure. No consolidation. Status post coronary artery bypass grafting. Electronically Signed   By: Lowella Grip III M.D.   On: 02/15/2020 05:36   Telemetry    RSR, brief run of SVT last night - Personally Reviewed  Cardiac Studies   2D Echocardiogram 6.21.2021  1. Left ventricular ejection fraction, by estimation, is 50 to 55%. The  left ventricle has low normal function. The left ventricle has no regional  wall motion abnormalities. There is mild left ventricular hypertrophy.  Left ventricular diastolic  parameters are consistent with Grade II diastolic dysfunction  (pseudonormalization).  2. Right ventricular systolic function is normal. The right ventricular  size is normal. There is moderately elevated pulmonary artery systolic  pressure.  3. Left atrial size was mildly dilated.  4. The mitral valve is abnormal. Mild to moderate mitral valve  regurgitation. No evidence of mitral stenosis.  5. The aortic valve is abnormal. Aortic valve regurgitation is not  visualized. Moderate aortic valve stenosis. Aortic valve area, by VTI  measures 1.06 cm. Aortic valve mean gradient measures 26.0 mmHg.  6. Aortic dilatation noted. There is mild dilatation of the ascending  aorta measuring 41 mm.  7. The inferior vena cava is normal in size with <50% respiratory  variability, suggesting right atrial pressure of 8 mmHg.  _____________   Attempted Cardiac Catheterization 7.26.2021  Aborted right and left cardiac catheterization due to respiratory distress and volume overload.  We will place the patient on BiPAP, nitroglycerin drip for blood pressure control and diuresis with plans to perform right and left cardiac catheterization once respiratory status improves. _____________   Patient Profile       64 y.o.malewith history of CAD status post CABG in 2012, moderate aortic stenosis, carotid artery disease status post left-sided CEA, DM2, CKD stage IV, anemia of chronic disease, HTN, HLD, PAD status post extensive lower extremity revascularization by vascular surgery in 05/2019, tobacco use,  obesity, and psoriasis, who was admitted 7/24 w/ NSTEMI and developed resp failure.  Assessment & Plan    1.  NSTEMI/CAD:  S/p CABG in 2012.  Admitted 7/24 w/ several week h/o intermittent L chest pain and dyspnea.  HsT 403  470.  Cath attempted 7/26, but developed resp failure resulting in abortion of the procedure.  EF 50-55% by echo, Mod AS.  Tx to floor 7/28.  No recurrent c/p or dyspnea.  Creat cont to climb  4.41 this AM.  With lack of symptoms, low-nl EF, and worsening renal fxn, will cont conservative therapy  ASA,  blocker, statin, and nitrate.  2.  Acute on chronic stage IV kidney dzs:  Creat 4.41 this AM.  Lasix gtt d/c'd in favor of torsemide yesterday.  Nephrology following.  3.  Acute on chronic HFpEF w/ PAH:  EF 50-55%. Body habitus makes exam challenging but no obvious edema, lungs clear.  Minus 1.7 L yesterday and minus 7.7 L since admission.  Wt recorded as down 5 kg since yesterday - 100.3 kg - ? Discrepancy as large drop unlikely in setting of de-escalation of diuretic dosing yesterday.  HR stable.  BP could be better.  Cont  blocker, nitrate, and ccb.  4.  Essential HTN:  BP trending a little lower over past 24 hrs.  Amlodipine 2.5 added yesterday.  Cont current doses of meds and readdress as outpt to determine if further escalation needed.  5.  HL:  LDL 56, cont statin.  6.  Anemia of chronic dzs: stable thus far.  7.  Moderate Ao Stenosis:  Stable.  Signed, Murray Hodgkins, NP  02/18/2020, 9:37 AM    For questions or updates, please contact   Please consult [email or web address] for contact info under Cardiology/STEMI.

## 2020-02-18 NOTE — Progress Notes (Signed)
[facility]  ROUNDING NOTE   Subjective:  Patient well-known to [place] as we follow him for chronic kidney disease stage IV. Comes in with angina and chest pain. Patient was unable to undergo cardiac catheterization because he could not lay flat  This morning he feels well.  Sitting up in the bed eating breakfast.  Able to bradycardia. No leg edema.  No nausea or vomiting reported.  Objective:  Vital signs in last 24 hours:  Temp:  [97.8 F (36.6 C)-98 F (36.7 C)] 97.8 F (36.6 C) (07/29 1109) Pulse Rate:  [76-81] 80 (07/29 1109) Resp:  [18] 18 (07/29 1109) BP: (122-163)/(77-89) 122/80 (07/29 1109) SpO2:  [96 %-99 %] 98 % (07/29 1109) Weight:  [100.3 kg] 100.3 kg (07/29 0613)  Weight change: -5.01 kg Filed Weights   02/16/20 0500 02/17/20 0354 02/18/20 3299  Weight: (!) 107.7 kg (!) 105.3 kg (!) 100.3 kg    Intake/Output: I/O last 3 completed shifts: In: 2426 [P.O.:1200; I.V.:41] Out: 2495 [Urine:2495]   Intake/Output this shift:  No intake/output data recorded.  Physical Exam: General: NAD  Head: Normocephalic, atraumatic. Moist oral mucosal membranes  Eyes: Anicteric  Lungs:   Decreased breath sounds at bases, [place] O2, otherwise clear  Heart: S3M1 2/6 systolic ejection murmur  Abdomen:  Soft, nontender, bowel sounds present  Extremities: trace peripheral edema.  Neurologic:  Alert and oriented  Skin: No lesions       Basic Metabolic Panel: Recent Labs  Lab 02/14/20 0540 02/14/20 0540 02/15/20 0731 02/15/20 0731 02/16/20 0416 02/17/20 0349 02/18/20 0328  NA 136  --  136  --  139 137 134*  K 4.7  --  4.8  --  4.6 4.1 4.2  CL 104  --  105  --  100 94* 93*  CO2 26  --  22  --  _0 GLUCOSE 144*  --  151*  --  170* 173* 153*  BUN 44*  --  51*  --  62* 84* 88*  CREATININE 3.86*  --  3.92*  --  3.98* 4.25* 4.41*  CALCIUM 8.0*   < > 7.7*   < > 8.6* 8.2* 7.8*  MG  --   --   --   --   --   --  1.8   < > = values in this interval not displayed.     Liver Function Tests: Recent Labs  Lab 02/13/20 0033  AST 18  ALT 15  ALKPHOS 95  BILITOT 0.6  PROT 7.1  ALBUMIN 3.4*   Recent Labs  Lab 02/13/20 0033  LIPASE 25   No results for input(s): AMMONIA in the last 168 hours.  CBC: Recent Labs  Lab 02/13/20 0033 02/14/20 0540 02/15/20 0731 02/17/20 0349  WBC 10.5 7.8 7.4 8.5  HGB 11.0* 9.4* 9.8* 10.4*  HCT 35.0* 28.3* 29.3* 32.6*  MCV 99.7 98.3 96.4 98.8  PLT 153 124* 139* 184    Cardiac Enzymes: No results for input(s): CKTOTAL, CKMB, CKMBINDEX, TROPONINI in the last 168 hours.  BNP: Invalid input(s): POCBNP  CBG: Recent Labs  Lab 02/17/20 1121 02/17/20 1601 02/17/20 2108 02/18/20 0724 02/18/20 1111  GLUCAP 146* 181* 120* 151* 155*    Microbiology: Results for orders placed or performed during the hospital encounter of 02/13/20  SARS Coronavirus 2 by RT PCR (hospital order, performed in [facility] hospital lab) Nasopharyngeal Nasopharyngeal Swab     Status: None   Collection Time: 02/13/20  3:01 AM  Specimen: Nasopharyngeal Swab  Result Value Ref Range Status   SARS Coronavirus 2 NEGATIVE NEGATIVE Final    Comment: (NOTE) SARS-CoV-2 target nucleic acids are NOT DETECTED.  The SARS-CoV-2 RNA is generally detectable in upper and lower respiratory specimens during the acute phase of infection. The lowest concentration of SARS-CoV-2 viral copies this assay can detect is 250 copies / mL. A negative result does not preclude SARS-CoV-2 infection and should not be used as the sole basis for treatment or other patient management decisions.  A negative result may occur with improper specimen collection / handling, submission of specimen other than nasopharyngeal swab, presence of viral mutation(s) within the areas targeted by this assay, and inadequate number of viral copies (<250 copies / mL). A negative result must be combined with clinical observations, patient history, and epidemiological  information.  Fact Sheet for Patients:   StrictlyIdeas.no  Fact Sheet for Healthcare Providers: BankingDealers.co.za  This test is not yet approved or  cleared by the [place] FDA and has been authorized for detection and/or diagnosis of SARS-CoV-2 by FDA under an Emergency Use Authorization (EUA).  This EUA will remain in effect (meaning this test can be used) for the duration of the COVID-19 declaration under Section 564(b)(1) of the Act, 21 U.S.C. section 360bbb-3(b)(1), unless the authorization is terminated or revoked sooner.  Performed at [facility], [address] [place], [place] [address]   MRSA PCR Screening     Status: None   Collection Time: 02/15/20  1:37 PM   Specimen: Nasal Mucosa; Nasopharyngeal  Result Value Ref Range Status   MRSA by PCR NEGATIVE NEGATIVE Final    Comment:        The GeneXpert MRSA Assay (FDA approved for NASAL specimens only), is one component of a comprehensive MRSA colonization surveillance program. It is not intended to diagnose MRSA infection nor to guide or monitor treatment for MRSA infections. Performed at [facility], [address] [place], [place] [address]     Coagulation Studies: No results for input(s): LABPROT, INR in the last 72 hours.  Urinalysis: No results for input(s): COLORURINE, LABSPEC, PHURINE, GLUCOSEU, HGBUR, BILIRUBINUR, KETONESUR, PROTEINUR, UROBILINOGEN, NITRITE, LEUKOCYTESUR in the last 72 hours.  Invalid input(s): APPERANCEUR    Imaging: No results found.   Medications:       Assessment/ Plan:  64 y.o. male with past medical history of coronary artery disease status post CABG, chronic kidney disease stage IV, diabetes mellitus type 2, chronic diastolic heart failure most recent ejection fraction 50 to 55%, aortic valve stenosis, anemia of chronic kidney disease, secondary hyperparathyroidism who was admitted with chest  pain.  1.  Acute kidney injury/chronic kidney disease stage IV with Volume overload  Baseline EGFR appears to be 19 from 02/08/2020.  07/28 0701 - 07/29 0700 In: 761 [P.O.:720; I.V.:41] Out: 2495 [Urine:2495] Lab Results  Component Value Date   CREATININE 4.41 (H) 02/18/2020   CREATININE 4.25 (H) 02/17/2020   CREATININE 3.98 (H) 02/16/2020     Patient has normal ejection fraction.  Cardiology recommending right and left heart catheterization.  Patient high risk for contrast-induced nephropathy.   Serum creatinine slightly worsened with aggressive diuresis but overall patient's clinical condition has improved. Edema has improved significantly along with respiratory and cardiac symptoms of chest pain. We will continue diuresis with torsemide 40 mg daily Monitor renal labs closely as outpatient   2.  Coronary artery disease/unstable angina.   Cardiac cath deferred for now EF 50 to 55%,  moderate aortic stenosis    LOS: 5 Bhavana Kady 7/29/202110:30 PM

## 2020-02-18 NOTE — Telephone Encounter (Signed)
Pt wants to keep 12pm on 8-3.

## 2020-02-18 NOTE — Evaluation (Signed)
Physical Therapy Evaluation Patient Details Name: [name] [name] MRN: [id number] DOB: Sep 30, 1955 Today's Date: 02/18/2020   History of Present Illness  [name] [name] is a 64 year old male who presented to ED with SOB and chest pain. Found to have elevated troponins, diagnosed with NSTEMI.  Left heart cath was attempted but patient went into respiratory distress and heart cath aborted. Patient was seen by nephrology, due to worsening renal function, patient was started on Lasix drip.  Pt's PMH includes anxiety and depression, arthritis, CAD s/p CABG, diabetes, and renal failure.  Clinical Impression  Pt received sitting EOB upon arrival to room and agreeable to participate in PT evaluation. Pt with good sitting balance as he was noted to be reaching outside of BOS with no overt LOB. During MMT, pt with generalized weakness noted in BLE. Pt ambulated 200 feet using small based quad cane and noted to have LOB. Pt with reduced reaction time requiring mod A for balance then completed ambulation distance with CGA for steadying. Pt refused ambulation attempt using RW however feel that his balance would have been improved had he done so. Vitals remained stable throughout session and pt denied SOB. Pt with decreased strength, endurance, and balance therefore recommend skilled PT to address deficits and HHPT at discharge to decreased fall risk, optimize return to PLOF and maximize functional mobility. Also recommend supervision with OOB/mobility as pt with decreased safety awareness and is a fall risk. This write feels that pt would benefit from RW for improved balance and safety with ambulation however pt refusing to utilize during session and anticipate would refuse usage in home setting.    Follow Up Recommendations Home health PT;Supervision for mobility/OOB    Equipment Recommendations  None recommended by PT    Recommendations for Other Services       Precautions / Restrictions  Precautions Precautions: Fall Restrictions Weight Bearing Restrictions: No Other Position/Activity Restrictions: pulse ox with ambulation      Mobility  Bed Mobility Overal bed mobility: Modified Independent             General bed mobility comments: not attempted as pt seated EOB upon arrival  Transfers Overall transfer level: Needs assistance Equipment used: Quad cane Transfers: Sit to/from Stand Sit to Stand: Min guard         General transfer comment: CGA for sit to stand transfer for steadying and slight boost into standing; stand to sit with supervision  Ambulation/Gait Ambulation/Gait assistance: Min guard Gait Distance (Feet): 200 Feet Assistive device: Quad cane Gait Pattern/deviations: Step-through pattern Gait velocity: slightly decreased   General Gait Details: Pt ambulated using SBQC in R hand initially with SBA. Pt with noted lateral LOB requiring min-mod A. Pt then ambulated with CGA for steadying.  Stairs            Wheelchair Mobility    Modified Rankin (Stroke Patients Only)       Balance Overall balance assessment: Needs assistance Sitting-balance support: Feet supported;No upper extremity supported Sitting balance-Leahy Scale: Good Sitting balance - Comments: pt noted to reacho outside of BOS and no overt LOB noted   Standing balance support: Single extremity supported Standing balance-Leahy Scale: Fair Standing balance comment: noted lateral LOB and slight staggering requiring CGA for balance                             Pertinent Vitals/Pain Pain Assessment: Faces Faces Pain Scale: Hurts little more Pain Location:  tailbone Pain Descriptors / Indicators: Sore;Discomfort Pain Intervention(s): Monitored during session    Home Living Family/patient expects to be discharged to:: Private residence Living Arrangements: Children Available Help at Discharge: Family;Available 24 hours/day Type of Home: House Home  Access: Ramped entrance     Home Layout: One level Home Equipment: Shower seat;Cane - quad;Walker - standard;Wheelchair - manual Additional Comments: PT reports that the manual wheelchair that he has does not fit through the doors in his house.    Prior Function Level of Independence: Independent with assistive device(s)         Comments: Pt reports usage of small based quad cane for ambulation and endorses multiple falls resulting in injuries, including a "badly bruised tailbone". Pt denies driving at this time however states that he wants to get back to it so that he can get himself around. He reports that he will need to call a cab to get home as [place] not [place] the [place].     Hand Dominance        Extremity/Trunk Assessment   Upper Extremity Assessment Upper Extremity Assessment: Overall WFL for tasks assessed (grossly 4 to 4+/5 bilaterally)    Lower Extremity Assessment Lower Extremity Assessment: Generalized weakness;Overall WFL for tasks assessed (grossly 3+ to 4-/5 bilaterally)    Cervical / Trunk Assessment Cervical / Trunk Assessment: Normal  Communication   Communication: No difficulties  Cognition Arousal/Alertness: Awake/alert Behavior During Therapy: WFL for tasks assessed/performed Overall Cognitive Status: Within Functional Limits for tasks assessed                                 General Comments: Pt alert and oriented x 4.      General Comments General comments (skin integrity, edema, etc.): Pt's SpO2 = 95% and HR stable in 90s after activity    Exercises Other Exercises Other Exercises: OTR provided education re: OT role and plan of care, fall and safety precautions, self care, energy conservation strategies   Assessment/Plan    PT Assessment Patient needs continued PT services  PT Problem List Decreased strength;Decreased activity tolerance;Decreased balance;Decreased mobility;Decreased safety  awareness;Pain       PT Treatment Interventions DME instruction;Gait training;Stair training;Functional mobility training;Therapeutic activities;Therapeutic exercise;Balance training;Patient/family education    PT Goals (Current goals can be found in the Care Plan section)  Acute Rehab PT Goals Patient Stated Goal: to go home PT Goal Formulation: With patient Time For Goal Achievement: 03/03/20 Potential to Achieve Goals: Good    Frequency Min 2X/week   Barriers to discharge        Co-evaluation               AM-PAC PT "6 Clicks" Mobility  Outcome Measure Help needed turning from your back to your side while in a flat bed without using bedrails?: None Help needed moving from lying on your back to sitting on the side of a flat bed without using bedrails?: A Little Help needed moving to and from a bed to a chair (including a wheelchair)?: A Little Help needed standing up from a chair using your arms (e.g., wheelchair or bedside chair)?: A Little Help needed to walk in hospital room?: A Little Help needed climbing 3-5 steps with a railing? : A Little 6 Click Score: 19    End of Session Equipment Utilized During Treatment: Gait belt Activity Tolerance: Patient tolerated treatment well Patient left: in  chair;with call bell/phone within reach;Other (comment) (MD in room) Nurse Communication: Mobility status PT Visit Diagnosis: Unsteadiness on feet (R26.81);Muscle weakness (generalized) (M62.81);History of falling (Z91.81);Pain Pain - Right/Left:  (central) Pain - part of body:  ("tailbone")    Time: 1016-1040 PT Time Calculation (min) (ACUTE ONLY): 24 min   Charges:             Vale Haven, SPT  Vale Haven 02/18/2020, 1:07 PM

## 2020-02-18 NOTE — Discharge Summary (Addendum)
Physician Discharge Summary  Patient ID: [name] [name] MRN: [id number] DOB/AGE: [birth year] 64 y.o.  Admit date: 02/13/2020 Discharge date: 02/18/2020  Admission Diagnoses:  Discharge Diagnoses:  Principal Problem:   NSTEMI (non-ST elevated myocardial infarction) [facility]) Active Problems:   Coronary artery disease   Type 2 diabetes mellitus with diabetic peripheral angiopathy without gangrene ([place])   CKD stage 4 due to type 2 diabetes mellitus (HCC)   Moderate aortic valve stenosis   Acute diastolic CHF (congestive heart failure) ([place])   Hypertensive urgency   Chronic kidney disease   Discharged Condition: fair  Hospital Course:  [name] [name] [facility] a 64 y.o.malewith medical history significant forCAD status post CABG, CKD 4, diabetes. Patient presented secondary to chest pain and shortness of breath with evidence of NSTEMI on admission. Heparin drip started. Cardiology consulted.   Patient was seen by nephrology, due to worsening renal function, patient was started on Lasix drip.  7/28.  Patient volume status much improved.  Renal function slightly worse.  Switched diuretics to torsemide 40 mg daily.  7/29. Patient feels very well. Renal function is slightly worse again today, patient refused to stay in the hospital. I discussed with Dr. Candiss Norse, patient renal function is close to dialysis. He will be followed closely with nephrology and determine the timing of dialysis.  #1.  Non-STEMI. Patient is seen by cardiology, at this time, did not recommend any work-up due to advanced renal disease. Continue medical treatment. Discontinue ARB due to worsening renal function.  #2.  Acute on chronic diastolic congestive heart failure. Condition much improved after giving IV Lasix drip. Currently patient has no evidence of any volume overload. Patient will be followed up with heart failure center as outpatient. May be restarted after recheck BMP in 2 or 3 days. Discussed with Dr. Candiss Norse  and Dr. Rockey Situ, prefer to give patient torsemide 40 mg daily.  3.  Chronic kidney disease stage IV. Worsening renal function after Lasix. Hold off diuretics for now. Follow-up closely with nephrology for needing of dialysis.  4.  Moderate aortic stenosis. Follow with cardiology  5.  Anemia of chronic disease. Stable.  6.  Essential hypertension. ARB switched to Norvasc. Continue beta-blocker.    Consults:  Nephrology and cardiology  Significant Diagnostic Studies:  CHEST  1 VIEW  COMPARISON:  February 13, 2020 chest radiograph; chest CT July 01, 2019  FINDINGS: There remains cardiomegaly with pulmonary venous hypertension. There is interstitial edema with small pleural effusions bilaterally. No airspace opacity. Patient is status post coronary artery bypass grafting. No adenopathy. No bone lesions.  IMPRESSION: Cardiomegaly with pulmonary vascular congestion. Interstitial edema and small pleural effusions. The appearance is felt to be indicative of a degree of congestive heart failure. No consolidation. Status post coronary artery bypass grafting.   Electronically Signed   By: Lowella Grip III M.D.   On: 02/15/2020 05:36    Treatments: Lasix drip  Discharge Exam: Blood pressure (!) 140/89, pulse 81, temperature 98 F (36.7 C), temperature source Oral, resp. rate 18, height 5\' 7"  (1.702 m), weight (!) 100.3 kg, SpO2 99 %. General appearance: alert and cooperative Resp: clear to auscultation bilaterally Cardio: regular rate and rhythm, S1, S2 normal, no murmur, click, rub or gallop GI: soft, non-tender; bowel sounds normal; no masses,  no organomegaly Extremities: extremities normal, atraumatic, no cyanosis or edema  Disposition: Discharge disposition: 01-Home or Self Care       Discharge Instructions    Diet general   Complete by: As directed  Renal diet   Increase activity slowly   Complete by: As directed      Allergies as of  02/18/2020      Reactions   Contrast Media [iodinated Diagnostic Agents] Rash, Other (See Comments)   Got very hot and red   Glipizide Other (See Comments)   ANTIDIABETICS. Burning   Metrizamide Other (See Comments)   Got very hot and red   Penicillins Hives, Swelling   Has patient had a PCN reaction causing immediate rash, facial/tongue/throat swelling, SOB or lightheadedness with hypotension: yes Has patient had a PCN reaction causing severe rash involving mucus membranes or skin necrosis: no  Has patient had a PCN reaction that required hospitalization: yes Has patient had a PCN reaction occurring within the last 10 years: no If all of the above answers are "NO", then may proceed with Cephalosporin use.      Medication List    STOP taking these medications   buPROPion 150 MG 24 hr tablet Commonly known as: WELLBUTRIN XL   DULoxetine 60 MG capsule Commonly known as: CYMBALTA   furosemide 20 MG tablet Commonly known as: LASIX   losartan 100 MG tablet Commonly known as: COZAAR   sertraline 100 MG tablet Commonly known as: Zoloft     TAKE these medications   albuterol 108 (90 Base) MCG/ACT inhaler Commonly known as: ProAir HFA Inhale 2 puffs into the lungs every 6 (six) hours as needed for shortness of breath.   amLODipine 2.5 MG tablet Commonly known as: NORVASC Take 1 tablet (2.5 mg total) by mouth daily. Start taking on: February 19, 2020   Artificial Tears 0.1-0.3 % Soln Generic drug: Dextran 70-Hypromellose Place 1 drop into both eyes 4 (four) times daily as needed (dry eyes).   aspirin 81 MG tablet Take 81 mg by mouth daily.   busPIRone 10 MG tablet Commonly known as: BUSPAR TAKE 2 TABLETS(20 MG) BY MOUTH TWICE DAILY What changed:   how much to take  how to take this  when to take this  additional instructions   calcitRIOL 0.25 MCG capsule Commonly known as: ROCALTROL Take 0.25 mcg by mouth 3 (three) times a week. Monday then Wednesday then Friday    carvedilol 12.5 MG tablet Commonly known as: COREG Take 1 tablet (12.5 mg total) by mouth 2 (two) times daily. What changed: when to take this   clopidogrel 75 MG tablet Commonly known as: PLAVIX TAKE 1 TABLET(75 MG) BY MOUTH DAILY What changed:   how much to take  how to take this  when to take this  additional instructions   cyclobenzaprine 10 MG tablet Commonly known as: FLEXERIL Take 10 mg by mouth 3 (three) times daily as needed for muscle spasms.   finasteride 5 MG tablet Commonly known as: PROSCAR Take 5 mg by mouth daily.   isosorbide mononitrate 60 MG 24 hr tablet Commonly known as: IMDUR Take 1 tablet (60 mg total) by mouth daily. Start taking on: February 19, 2020   multivitamin capsule Take 1 capsule by mouth daily.   nitroGLYCERIN 0.4 MG SL tablet Commonly known as: NITROSTAT Place 1 tablet (0.4 mg total) under the tongue every 5 (five) minutes as needed.   nystatin powder Commonly known as: MYCOSTATIN/NYSTOP Apply 1 application topically 3 (three) times daily. Substitute other antifungal powder as needed if insurance issues   oxyCODONE-acetaminophen 5-325 MG tablet Commonly known as: Percocet Take 1 tablet by mouth every 4 (four) hours as needed for severe pain.   simvastatin  20 MG tablet Commonly known as: ZOCOR TAKE 1 TABLET(20 MG) BY MOUTH DAILY AT 6 PM What changed: See the new instructions.   tamsulosin 0.4 MG Caps capsule Commonly known as: FLOMAX Take 1 capsule by mouth daily What changed:   how much to take  how to take this  when to take this  additional instructions       Follow-up Information    [facility] Follow up on 02/29/2020.   Specialty: Cardiology Why: at 11:00am. Enter through the [facility] entrance Contact information: [address] [place] [phone]       Viviana Simpler I, MD Follow up in 1 week(s).   Specialties:  Internal Medicine, Pediatrics Contact information: [place] [address] [phone]        Wellington Hampshire, MD .   Specialty: Cardiology Contact information: [place] [address] [phone]        Lavonia Dana, MD Follow up in 3 day(s).   Specialty: Nephrology Why: check BMP in 3 days Contact information: [place] [address] [phone]              35 minutes Signed: Sharen Hones 02/18/2020, 10:24 AM

## 2020-02-18 NOTE — Evaluation (Signed)
Occupational Therapy Evaluation Patient Details Name: [name] [name] MRN: [id number] DOB: 1956/05/14 Today's Date: 02/18/2020    History of Present Illness [name] [name] is a 64 year old male who presented to ED with SOB and chest pain. Found to have elevated troponins, diagnosed with NSTEMI.  Left heart cath was attempted but patient went into respiratory distress and heart cath aborted. Patient was seen by nephrology, due to worsening renal function, patient was started on Lasix drip.  Pt's PMH includes anxiety and depression, arthritis, CAD s/p CABG, diabetes, and renal failure.   Clinical Impression   [name] [name] presents to OT with decreased endurance that impacts his ability to safely and independently complete functional tasks.  He reports being generally mod I in ADLs using quad cane.  Currently, pt requires supervision/stand by assist for all BADLs 2/2 decreased endurance.  OTR provided supervision assist for pt to complete all functional transfers, ambulation in room, and standing grooming tasks using quad cane.  OTR provided education re: strategies to reduce fall risk and improve energy conservation.  Pt verbalized understanding.  Pt's HR and SpO2 stable throughout evaluation, including after activity.  [name] [name] appears to be close to his baseline level of functioning, so will likely not continue to benefit from skilled OT services in acute setting.  Will discharge pt from OT caseload, please re-consult if pt has change in functional status or additional OT-related needs arise.    Follow Up Recommendations  No OT follow up;Supervision - Intermittent    Equipment Recommendations  None recommended by OT    Recommendations for Other Services       Precautions / Restrictions Precautions Precautions: None Restrictions Weight Bearing Restrictions: No Other Position/Activity Restrictions: pulse ox with ambulation      Mobility Bed Mobility Overal bed mobility: Modified  Independent                Transfers Overall transfer level: Modified independent Equipment used: Quad cane             General transfer comment: OTR provided supervision assist for functional transfers, no LOB observed    Balance Overall balance assessment: Needs assistance Sitting-balance support: No upper extremity supported;Feet supported Sitting balance-Leahy Scale: Good     Standing balance support: No upper extremity supported Standing balance-Leahy Scale: Fair                             ADL either performed or assessed with clinical judgement   ADL Overall ADL's : Modified independent;At baseline                                       General ADL Comments: Pt generally requires supervision assist for ADLs 2/2 decreased endurance and impaired balance.  OTR provided supervision assist for pt to complete functional transfers, ambulation in room, and standing grooming tasks with quad cane.     Vision Baseline Vision/History: Wears glasses Wears Glasses: Reading only Patient Visual Report: No change from baseline       Perception     Praxis      Pertinent Vitals/Pain Pain Assessment: No/denies pain     Hand Dominance     Extremity/Trunk Assessment Upper Extremity Assessment Upper Extremity Assessment: Overall WFL for tasks assessed   Lower Extremity Assessment Lower Extremity Assessment: Defer to PT evaluation   Cervical / Trunk  Assessment Cervical / Trunk Assessment: Normal   Communication Communication Communication: No difficulties   Cognition Arousal/Alertness: Awake/alert Behavior During Therapy: WFL for tasks assessed/performed;Agitated (pt fairly perseverative and agitated about wanting to discharge) Overall Cognitive Status: Within Functional Limits for tasks assessed                                 General Comments: Grossly oriented, engaged throughout eval   General Comments  Pt's SpO2 =  95% and HR stable in 90s after activity    Exercises Other Exercises Other Exercises: OTR provided education re: OT role and plan of care, fall and safety precautions, self care, energy conservation strategies   Shoulder Instructions      Home Living Family/patient expects to be discharged to:: Private residence Living Arrangements: Children (lives with stepson, his wife, and grandson) Available Help at Discharge: Family;Available PRN/intermittently Type of Home: House Home Access: Ramped entrance     Home Layout: One level     Bathroom Shower/Tub: Occupational psychologist: Standard     Home Equipment: Environmental consultant - 2 wheels;Wheelchair - manual;Cane - quad;Grab bars - tub/shower;Shower seat          Prior Functioning/Environment Level of Independence: Independent with assistive device(s)        Comments: Pt is generally mod I in ADLs using quad cane.  He reports previous fall history 2/2 neuropathy and blood pressure medications, but states this has resolved since changing his BP meds.  Pt states his last fall was in March.  Pt is not currently driving but would like to return to driving and becoming more active.  He is independent in medication management and household maintenance.        OT Problem List: Decreased strength;Decreased activity tolerance;Impaired balance (sitting and/or standing);Decreased safety awareness      OT Treatment/Interventions:      OT Goals(Current goals can be found in the care plan section) Acute Rehab OT Goals Patient Stated Goal: to go home OT Goal Formulation: With patient  OT Frequency:     Barriers to D/C:            Co-evaluation              AM-PAC OT "6 Clicks" Daily Activity     Outcome Measure Help from another person eating meals?: None Help from another person taking care of personal grooming?: None Help from another person toileting, which includes using toliet, bedpan, or urinal?: None Help from another  person bathing (including washing, rinsing, drying)?: None Help from another person to put on and taking off regular upper body clothing?: None Help from another person to put on and taking off regular lower body clothing?: None 6 Click Score: 24   End of Session Equipment Utilized During Treatment: Other (comment) (quad cane) Nurse Communication: Other (comment) (Pt requesting soda, RN notified 2/2 fluid restrictions)  Activity Tolerance: Patient tolerated treatment well Patient left: in bed;with call bell/phone within reach  OT Visit Diagnosis: Other abnormalities of gait and mobility (R26.89)                Time: 7939-0300 OT Time Calculation (min): 46 min Charges:  OT General Charges $OT Visit: 1 Visit OT Evaluation $OT Eval Low Complexity: 1 Low OT Treatments $Self Care/Home Management : 8-22 mins  Myrtie Hawk Sinai [place], OTR/L 02/18/20, 10:45 AM

## 2020-02-18 NOTE — Telephone Encounter (Signed)
Transition Care Management Follow-up Telephone Call  Date of discharge and from where: 02/18/2020, [facility]  How have you been since you were released from the hospital? Patient states that he is still very tired. Just resting and taking it easy since getting home.   Any questions or concerns? No   Items Reviewed:  Did the pt receive and understand the discharge instructions provided? Yes   Medications obtained and verified? Yes   Any new allergies since your discharge? No   Dietary orders reviewed? Yes  Do you have support at home? Yes   Functional Questionnaire: (I = Independent and D = Dependent) ADLs: I  Bathing/Dressing- I  Meal Prep- I  Eating- I  Maintaining continence- I  Transferring/Ambulation- I  Managing Meds- I  Follow up appointments reviewed:   PCP Hospital f/u appt confirmed? Yes  Scheduled to see Dr. [name] on 02/23/2020 @ 12 pm.  [facility] f/u appt confirmed? Yes  Scheduled to see cardiology   Are transportation arrangements needed? No   If their condition worsens, is the pt aware to call PCP or go to the Emergency Dept.? Yes  Was the patient provided with contact information for the PCP's office or ED? Yes  Was to pt encouraged to call back with questions or concerns? Yes

## 2020-02-18 NOTE — Telephone Encounter (Signed)
Please call and make sure he is okay with Tuesday at noon-----otherwise I could still see him at Monday at 1:45PM

## 2020-02-18 NOTE — Discharge Instructions (Signed)
1. Follow-up with nephrology ASAP. Repeat BMP in 2 to 3 days. 2. Follow-up with PCP in 1 week. 3. Hold off diuretics for the next 2 to 3 days may restart after seen by nephrology if renal function is better.

## 2020-02-18 NOTE — Progress Notes (Signed)
[name] to be D/C'd Home per MD order.  Discussed prescriptions and follow up appointments with the patient. Prescriptions were e-prescribed, medication list explained in detail. Pt verbalized understanding.  Allergies as of 02/18/2020      Reactions   Contrast Media [iodinated Diagnostic Agents] Rash, Other (See Comments)   Got very hot and red   Glipizide Other (See Comments)   ANTIDIABETICS. Burning   Metrizamide Other (See Comments)   Got very hot and red   Penicillins Hives, Swelling   Has patient had a PCN reaction causing immediate rash, facial/tongue/throat swelling, SOB or lightheadedness with hypotension: yes Has patient had a PCN reaction causing severe rash involving mucus membranes or skin necrosis: no  Has patient had a PCN reaction that required hospitalization: yes Has patient had a PCN reaction occurring within the last 10 years: no If all of the above answers are "NO", then may proceed with Cephalosporin use.      Medication List    STOP taking these medications   buPROPion 150 MG 24 hr tablet Commonly known as: WELLBUTRIN XL   DULoxetine 60 MG capsule Commonly known as: CYMBALTA   furosemide 20 MG tablet Commonly known as: LASIX   losartan 100 MG tablet Commonly known as: COZAAR   sertraline 100 MG tablet Commonly known as: Zoloft     TAKE these medications   albuterol 108 (90 Base) MCG/ACT inhaler Commonly known as: ProAir HFA Inhale 2 puffs into the lungs every 6 (six) hours as needed for shortness of breath.   amLODipine 2.5 MG tablet Commonly known as: NORVASC Take 1 tablet (2.5 mg total) by mouth daily. Start taking on: February 19, 2020   Artificial Tears 0.1-0.3 % Soln Generic drug: Dextran 70-Hypromellose Place 1 drop into both eyes 4 (four) times daily as needed (dry eyes).   aspirin 81 MG tablet Take 81 mg by mouth daily.   busPIRone 10 MG tablet Commonly known as: BUSPAR TAKE 2 TABLETS(20 MG) BY MOUTH TWICE DAILY What changed:    how much to take  how to take this  when to take this  additional instructions   calcitRIOL 0.25 MCG capsule Commonly known as: ROCALTROL Take 0.25 mcg by mouth 3 (three) times a week. Monday then Wednesday then Friday   carvedilol 12.5 MG tablet Commonly known as: COREG Take 1 tablet (12.5 mg total) by mouth 2 (two) times daily. What changed: when to take this   clopidogrel 75 MG tablet Commonly known as: PLAVIX TAKE 1 TABLET(75 MG) BY MOUTH DAILY What changed:   how much to take  how to take this  when to take this  additional instructions   cyclobenzaprine 10 MG tablet Commonly known as: FLEXERIL Take 10 mg by mouth 3 (three) times daily as needed for muscle spasms.   finasteride 5 MG tablet Commonly known as: PROSCAR Take 5 mg by mouth daily.   isosorbide mononitrate 60 MG 24 hr tablet Commonly known as: IMDUR Take 1 tablet (60 mg total) by mouth daily. Start taking on: February 19, 2020   multivitamin capsule Take 1 capsule by mouth daily.   nitroGLYCERIN 0.4 MG SL tablet Commonly known as: NITROSTAT Place 1 tablet (0.4 mg total) under the tongue every 5 (five) minutes as needed.   nystatin powder Commonly known as: MYCOSTATIN/NYSTOP Apply 1 application topically 3 (three) times daily. Substitute other antifungal powder as needed if insurance issues   oxyCODONE-acetaminophen 5-325 MG tablet Commonly known as: Percocet Take 1 tablet by  mouth every 4 (four) hours as needed for severe pain.   simvastatin 20 MG tablet Commonly known as: ZOCOR TAKE 1 TABLET(20 MG) BY MOUTH DAILY AT 6 PM What changed: See the new instructions.   tamsulosin 0.4 MG Caps capsule Commonly known as: FLOMAX Take 1 capsule by mouth daily What changed:   how much to take  how to take this  when to take this  additional instructions   torsemide 20 MG tablet Commonly known as: Demadex Take 2 tablets (40 mg total) by mouth daily.       Vitals:   02/18/20 0723  02/18/20 1109  BP: (!) 140/89 122/80  Pulse: 81 80  Resp: 18 18  Temp: 98 F (36.7 C) 97.8 F (36.6 C)  SpO2: 99% 98%    Tele box removed and returned.Skin clean, dry and intact without evidence of skin break down, no evidence of skin tears noted. IV catheter discontinued intact. Site without signs and symptoms of complications. Dressing and pressure applied. Pt denies pain at this time. No complaints noted.  An After Visit Summary was printed and given to the patient. Patient escorted via [place], and D/C home via private auto.  Rolley Sims

## 2020-02-19 ENCOUNTER — Telehealth: Payer: Self-pay | Admitting: *Deleted

## 2020-02-19 ENCOUNTER — Telehealth: Payer: Self-pay | Admitting: Family

## 2020-02-19 ENCOUNTER — Telehealth: Payer: Self-pay

## 2020-02-19 ENCOUNTER — Other Ambulatory Visit: Payer: Self-pay | Admitting: *Deleted

## 2020-02-19 MED ORDER — BUSPIRONE HCL 10 MG PO TABS: ORAL_TABLET | ORAL | 3 refills | Status: AC

## 2020-02-19 NOTE — Chronic Care Management (AMB) (Signed)
Requesting refill on buspirone to [facility].  Thank you,  Debbora Dus, PharmD Clinical Pharmacist [facility] at [facility] [phone]

## 2020-02-19 NOTE — Telephone Encounter (Signed)
Patient of Dr. Fletcher Anon and received request from Dr. Rockey Situ to call and check dosage of torsemide and return follow up hospital visit. Spoke with patient and confirmed his dose of torsemide 40 mg once daily. Scheduled him to see APP here in office next week as follow up from hospital. He confirmed appointment and states that if something comes up then he would call to reschedule. He was appreciative for the call with no further questions at this time. Advised to please arrive early due to entrance at [facility]. He is aware of this. No further concerns at this time.

## 2020-02-19 NOTE — Telephone Encounter (Signed)
Rx sent electronically.  

## 2020-02-19 NOTE — Telephone Encounter (Signed)
°  Spoke with patient who confirmed he is doing ok since discharge from the hospital. He has no symptoms to report, taking all his meds minus a few hes unable to take but he is in contact with his doctor to see if they can change them. He is staying active the best that he can. He confirmed his new patient CHF Clinic appointment with [place] for 8/9  Dajae Kizer, NT

## 2020-02-20 DIAGNOSIS — E1151 Type 2 diabetes mellitus with diabetic peripheral angiopathy without gangrene: Secondary | ICD-10-CM | POA: Diagnosis not present

## 2020-02-20 DIAGNOSIS — I2510 Atherosclerotic heart disease of native coronary artery without angina pectoris: Secondary | ICD-10-CM | POA: Diagnosis not present

## 2020-02-20 DIAGNOSIS — I214 Non-ST elevation (NSTEMI) myocardial infarction: Secondary | ICD-10-CM | POA: Diagnosis not present

## 2020-02-20 DIAGNOSIS — I5033 Acute on chronic diastolic (congestive) heart failure: Secondary | ICD-10-CM | POA: Diagnosis not present

## 2020-02-20 DIAGNOSIS — R21 Rash and other nonspecific skin eruption: Secondary | ICD-10-CM | POA: Diagnosis not present

## 2020-02-20 DIAGNOSIS — I10 Essential (primary) hypertension: Secondary | ICD-10-CM | POA: Diagnosis not present

## 2020-02-22 ENCOUNTER — Other Ambulatory Visit: Payer: Self-pay | Admitting: *Deleted

## 2020-02-22 ENCOUNTER — Ambulatory Visit: Payer: Medicare Other | Admitting: Internal Medicine

## 2020-02-22 ENCOUNTER — Telehealth: Payer: Self-pay | Admitting: *Deleted

## 2020-02-22 NOTE — Telephone Encounter (Signed)
Jeani Hawking at [facility] left a voicemail requesting verbal orders to resume nursing care for patient. Jeani Hawking is requesting once a week for 5 weeks.

## 2020-02-22 NOTE — Progress Notes (Signed)
Cardiology Office Note    Date:  02/23/2020   ID:  [name] [name], DOB Feb 04, 1956, MRN [id number]  PCP:  [name], MD  Cardiologist:  [name], MD  Electrophysiologist:  None   Chief Complaint: Hospital follow-up  History of Present Illness:   [name] [name] is a 64 y.o. male with history of CAD status post CABG in 2012, moderate aortic stenosis, carotid artery disease status post left-sided CEA, DM2, CKD stage IV, anemia of chronic disease, HTN, HLD, PAD status post extensive lower extremity revascularization by vascular surgery in 05/2019, tobacco use, obesity, and psoriasis who presents for hospital follow up after recent admission to [facility] from 7/24 to 7/29 for a NSTEMI.   [name] most recently underwent ischemic evaluation via stress testing in 2013 which showed no evidence of ischemia.  Echo in 12/2018 showed normal LV systolic function with moderate aortic stenosis with an aortic valve area 1.28 cm and a mean gradient of 20 mmHg and a mildly dilated aortic root at 4 cm.  He was most recently seen by Dr. Fletcher Anon in 10/2019 and was doing well from a cardiac perspective.  He continued to cope with the passing of his wife from pancreatic cancer.  His renal function continued to deteriorate and is followed by nephrology.  He underwent echo and 12/2019 which demonstrated low normal EF of 50 to 55%, no regional wall motion abnormalities, mild LVH, grade 2 diastolic dysfunction, normal RV systolic function and RV cavity size, mildly dilated left atrium, mild to moderate mitral regurgitation, moderate aortic stenosis with a valve area of 1.06 cm, and a mean gradient of 26 mm.  He was seen in the ED on 02/08/2020 with right-sided flank pain that was worse with movement.  CT renal stone protocol showed no acute intra-abdominal findings.  Laboratory evaluation was unrevealing.  Following this, he was admitted to [facility] on 7/24 with a NSTEMI with HS-Tn peaking at 470.  Plan was to have the  patient undergo R/LHC, though this ended up being aborted given respiratory status during the procedure with plans for [facility] following diuresis with Lasix gtt.  He was insistent on going home.  Cath was ultimately deferred given declining renal function with a discharge SCr of 4.41. He was not discharged on diuretic therapy.   He comes in doing relatively well.  He did have an episode of substernal chest pressure last evening at rest rated a 10 out of 10 and lasted for 10 minutes in duration with spontaneous resolution.  He did not have any sublingual nitroglycerin to take at home.  No associated symptoms with this.  Since then, he has been chest pain-free.  He is tolerating amlodipine, aspirin, carvedilol, clopidogrel, isosorbide, simvastatin, and torsemide.  No lower extremity swelling, abdominal distention, orthopnea, PND, early satiety.  No dizziness, presyncope, or syncope.  He would like to move forward with cardiac cath when able.  He would like a refill of his nitro.  Otherwise, he does not have any issues or concerns at this time.   Labs independently reviewed: 01/2020 - magnesium 1.8, potassium 4.2, BUN 88, SCr 4.41, HGB 10.4, PLT 184, TC 122, TG 163, HDL 33, LDL 56, A1c 5.7  Past Medical History:  Diagnosis Date  . Adenomatous colon polyp   . Allergy   . Angina at rest [facility])    chronic  . Anxiety   . Arthritis    RA  . Bell's palsy 2007  . Carotid artery occlusion   .  Cataract    Dr. Dawna Part  . Coronary artery disease   . Depression   . Diabetes mellitus   . Diverticulosis   . Duodenitis   . DVT (deep venous thrombosis) (HCC)    in leg  . Dyspnea    DOE  . Gastropathy   . GERD (gastroesophageal reflux disease)   . Heart murmur   . Helicobacter pylori gastritis   . Hyperlipidemia   . Hypertension   . Kidney failure   . Mild aortic stenosis   . Neuropathy   . Obesity   . Peripheral vascular disease ([place])   . Post splenectomy syndrome   . Psoriasis   . Psoriatic  arthritis ([place])   . Renal disorder    stage 4 kidney failure  . Sleep apnea    uses cpap  . SOB (shortness of breath)   . Stroke ([place])   . Tobacco use disorder    recently quit  . Tubular adenoma 08/2013   Dr. Hilarie Fredrickson  . Weak urinary stream     Past Surgical History:  Procedure Laterality Date  . CARDIAC CATHETERIZATION  08/2010   LAD: 80% ISR, RCA: 80% ostial, OM 80-90%  . CAROTID ENDARTERECTOMY  08/2010   left/ Dr. Kellie Simmering  . CATARACT EXTRACTION W/PHACO Left 07/09/2017   Procedure: CATARACT EXTRACTION PHACO AND INTRAOCULAR LENS PLACEMENT (IOC);  Surgeon: Birder Robson, MD;  Location: [facility] ORS;  Service: Ophthalmology;  Laterality: Left;  [place] 00:23 AP% 14.2 CDE 3.26 Fluid pack lot # [id number]  . CATARACT EXTRACTION W/PHACO Right 09/10/2017   Procedure: CATARACT EXTRACTION PHACO AND INTRAOCULAR LENS PLACEMENT (IOC);  Surgeon: Birder Robson, MD;  Location: [facility] ORS;  Service: Ophthalmology;  Laterality: Right;  [place] 00:26.4 AP% 17.3 CDE 4.59 Fluid Pack Lot # [id number]  . COLONOSCOPY    . CORONARY ANGIOPLASTY     LAD: before CABG  . CORONARY ARTERY BYPASS GRAFT  09/06/2010   At [facility]: LIMA to LAD, left radial to RCA, sequential SVG to OM3 and 4  . heart stents  Jan 2011   leg stents 06/2009 and 03/2010  . LOWER EXTREMITY ANGIOGRAPHY Left 06/09/2019   Procedure: LOWER EXTREMITY ANGIOGRAPHY;  Surgeon: Katha Cabal, MD;  Location: [place] [facility];  Service: Cardiovascular;  Laterality: Left;  . PTA of illiac and SFA  multiple   Dr. Ronalee Belts, s/p revision 11/2012  . RIGHT/LEFT HEART CATH AND CORONARY/GRAFT ANGIOGRAPHY N/A 02/15/2020   Procedure: RIGHT/LEFT HEART CATH AND CORONARY/GRAFT ANGIOGRAPHY;  Surgeon: Wellington Hampshire, MD;  Location: [place] CV [facility];  Service: Cardiovascular;  Laterality: N/A;  . SPLENECTOMY    . VASCULAR SURGERY     LEG STENTS    Current Medications: Current Meds  Medication Sig  . albuterol (PROAIR HFA) 108 (90 Base) MCG/ACT inhaler  Inhale 2 puffs into the lungs every 6 (six) hours as needed for shortness of breath.  [name] amLODipine (NORVASC) 2.5 MG tablet Take 1 tablet (2.5 mg total) by mouth daily.  [name] aspirin 81 MG tablet Take 81 mg by mouth daily.  . busPIRone (BUSPAR) 10 MG tablet TAKE 2 TABLETS(20 MG) BY MOUTH TWICE DAILY  . calcitRIOL (ROCALTROL) 0.25 MCG capsule Take 0.25 mcg by mouth 3 (three) times a week. Monday then Wednesday then Friday  . carvedilol (COREG) 12.5 MG tablet Take 1 tablet (12.5 mg total) by mouth 2 (two) times daily. (Patient taking differently: Take 12.5 mg by mouth daily. )  . clopidogrel (PLAVIX) 75 MG tablet Take 75 mg  by mouth daily.  . cyclobenzaprine (FLEXERIL) 10 MG tablet Take 10 mg by mouth 3 (three) times daily as needed for muscle spasms.   . Dextran 70-Hypromellose (ARTIFICIAL TEARS) 0.1-0.3 % SOLN Place 1 drop into both eyes 4 (four) times daily as needed (dry eyes).  . finasteride (PROSCAR) 5 MG tablet Take 5 mg by mouth daily.  . isosorbide mononitrate (IMDUR) 60 MG 24 hr tablet Take 1 tablet (60 mg total) by mouth daily.  . Multiple Vitamin (MULTIVITAMIN) capsule Take 1 capsule by mouth daily.   . nitroGLYCERIN (NITROSTAT) 0.4 MG SL tablet Place 1 tablet (0.4 mg total) under the tongue every 5 (five) minutes as needed.  . nystatin (MYCOSTATIN/NYSTOP) powder Apply 1 application topically 3 (three) times daily. Substitute other antifungal powder as needed if insurance issues  . oxyCODONE-acetaminophen (PERCOCET) 5-325 MG tablet Take 1 tablet by mouth every 4 (four) hours as needed for severe pain.  . simvastatin (ZOCOR) 20 MG tablet TAKE 1 TABLET(20 MG) BY MOUTH DAILY AT 6 PM (Patient taking differently: Take 20 mg by mouth every evening. )  . tamsulosin (FLOMAX) 0.4 MG CAPS capsule Take 1 capsule by mouth daily (Patient taking differently: Take 0.4 mg by mouth daily. )  . torsemide (DEMADEX) 20 MG tablet Take 2 tablets (40 mg total) by mouth daily.    Allergies:   Contrast media  [iodinated diagnostic agents], Glipizide, Metrizamide, and Penicillins   Social History   Socioeconomic History  . Marital status: Widowed    Spouse name: Not on file  . Number of children: 2  . Years of education: Not on file  . Highest education level: Not on file  Occupational History  . Occupation: Chief Operating Officer at [employer]: Disabled mostly due to neuropathy  Tobacco Use  . Smoking status: Former Smoker    Packs/day: 0.75    Years: 40.00    Pack years: 30.00    Types: Cigarettes    Quit date: 04/15/2017    Years since quitting: 2.8  . Smokeless tobacco: Never Used  . Tobacco comment: 1 cigarette a day  Vaping Use  . Vaping Use: Never used  Substance and Sexual Activity  . Alcohol use: No  . Drug use: No  . Sexual activity: Never  Other Topics Concern  . Not on file  Social History Narrative   Wife died Jan 20, 2019      Has living will   Gearldine Shown and his wife Levada Dy should be his health care POA   Would accept resuscitation --but no prolonged ventilation   No tube feeds if cognitively unaware   Social Determinants of Health   Financial Resource Strain: Low Risk   . Difficulty of Paying Living Expenses: Not very hard  Food Insecurity: No Food Insecurity  . Worried About Charity fundraiser in the Last Year: Never true  . Ran Out of Food in the Last Year: Never true  Transportation Needs: No Transportation Needs  . Lack of Transportation (Medical): No  . Lack of Transportation (Non-Medical): No  Physical Activity:   . Days of Exercise per Week:   . Minutes of Exercise per Session:   Stress: No Stress Concern Present  . Feeling of Stress : Only a little  Social Connections: Unknown  . Frequency of Communication with Friends and Family: More than three times a week  . Frequency of Social Gatherings with Friends and Family: Not on file  . Attends Religious Services: Not on file  .  Active Member of Clubs or Organizations: Not on file  . Attends English as a second language teacher Meetings: Not on file  . Marital Status: Not on file     Family History:  The patient's family history includes Alcoholism in his brother, brother, sister, sister, and sister; Alzheimer's disease in his mother; Arthritis in his father and mother; Breast cancer (age of onset: 30) in his sister; Colon polyps (age of onset: 44) in his paternal grandfather; Dementia in his mother; Diabetes in his paternal grandfather; Epilepsy in his sister; Lung cancer (age of onset: 63) in his father.  ROS:   Review of Systems  Constitutional: Positive for malaise/fatigue. Negative for chills, diaphoresis, fever and weight loss.  HENT: Negative for congestion.   Eyes: Negative for discharge and redness.  Respiratory: Negative for cough, sputum production, shortness of breath and wheezing.   Cardiovascular: Positive for chest pain. Negative for palpitations, orthopnea, claudication, leg swelling and PND.  Gastrointestinal: Negative for abdominal pain, heartburn, nausea and vomiting.  Musculoskeletal: Negative for falls and myalgias.  Skin: Negative for rash.  Neurological: Positive for weakness. Negative for dizziness, tingling, tremors, sensory change, speech change, focal weakness and loss of consciousness.  Endo/Heme/Allergies: Does not bruise/bleed easily.  Psychiatric/Behavioral: Negative for substance abuse. The patient is not nervous/anxious.   All other systems reviewed and are negative.    EKGs/Labs/Other Studies Reviewed:    Studies reviewed were summarized above. The additional studies were reviewed today:  2D echo 01/11/2020: 1. Left ventricular ejection fraction, by estimation, is 50 to 55%. The  left ventricle has low normal function. The left ventricle has no regional  wall motion abnormalities. There is mild left ventricular hypertrophy.  Left ventricular diastolic  parameters are consistent with Grade II diastolic dysfunction  (pseudonormalization).  2. Right  ventricular systolic function is normal. The right ventricular  size is normal. There is moderately elevated pulmonary artery systolic  pressure.  3. Left atrial size was mildly dilated.  4. The mitral valve is abnormal. Mild to moderate mitral valve  regurgitation. No evidence of mitral stenosis.  5. The aortic valve is abnormal. Aortic valve regurgitation is not  visualized. Moderate aortic valve stenosis. Aortic valve area, by VTI  measures 1.06 cm. Aortic valve mean gradient measures 26.0 mmHg.  6. Aortic dilatation noted. There is mild dilatation of the ascending  aorta measuring 41 mm.  7. The inferior vena cava is normal in size with <50% respiratory  variability, suggesting right atrial pressure of 8 mmHg.   Comparison(s): Previous Echo showed normal LV systolic function with  moderate aortic stenosis and mildly dilated aortic root at 4 cm. Aortic  valve area was 1.28 cm.  ___________    EKG:  EKG is ordered today.  The EKG ordered today demonstrates NSR, 81 bpm, left anterior fascicular block, possible prior septal infarct, poor R wave progression along the precordial leads, nonspecific inferolateral ST-T changes  Recent Labs: 02/13/2020: ALT 15; B Natriuretic Peptide 713.3 02/17/2020: Hemoglobin 10.4; Platelets 184 02/18/2020: BUN 88; Creatinine, Ser 4.41; Magnesium 1.8; Potassium 4.2; Sodium 134  Recent Lipid Panel    Component Value Date/Time   CHOL 122 02/13/2020 0525   CHOL 116 11/18/2017 1002   TRIG 163 (H) 02/13/2020 0525   HDL 33 (L) 02/13/2020 0525   HDL 30 (L) 11/18/2017 1002   CHOLHDL 3.7 02/13/2020 0525   VLDL 33 02/13/2020 0525   LDLCALC 56 02/13/2020 0525   LDLCALC 51 11/18/2017 1002   LDLDIRECT 61.0 03/15/2017 1021  PHYSICAL EXAM:    VS:  BP (!) 144/88 (BP Location: Left Arm, Patient Position: Sitting, Cuff Size: Normal)   Pulse 81   Ht 5\' 7"  (1.702 m)   Wt 231 lb (104.8 kg)   SpO2 96%   BMI 36.18 kg/m   BMI: Body mass index is 36.18  kg/m.  Physical Exam Constitutional:      Appearance: He is well-developed.  HENT:     Head: Normocephalic and atraumatic.  Eyes:     General:        Right eye: No discharge.        Left eye: No discharge.  Neck:     Vascular: No JVD.  Cardiovascular:     Rate and Rhythm: Normal rate and regular rhythm.     Pulses: No midsystolic click and no opening snap.          Posterior tibial pulses are 2+ on the right side and 2+ on the left side.     Heart sounds: S1 normal and S2 normal. Heart sounds not distant. Murmur heard.  Harsh midsystolic murmur is present with a grade of 3/6 at the upper right sternal border radiating to the neck.  No friction rub.  Pulmonary:     Effort: Pulmonary effort is normal. No respiratory distress.     Breath sounds: Normal breath sounds. No decreased breath sounds, wheezing or rales.  Chest:     Chest wall: No tenderness.  Abdominal:     General: There is no distension.     Palpations: Abdomen is soft.     Tenderness: There is no abdominal tenderness.  Musculoskeletal:     Cervical back: Normal range of motion.  Skin:    General: Skin is warm and dry.     Nails: There is no clubbing.  Neurological:     Mental Status: He is alert and oriented to person, place, and time.  Psychiatric:        Speech: Speech normal.        Behavior: Behavior normal.        Thought Content: Thought content normal.        Judgment: Judgment normal.     Wt Readings from Last 3 Encounters:  02/23/20 231 lb (104.8 kg)  02/18/20 (!) 221 lb 1.6 oz (100.3 kg)  02/08/20 217 lb (98.4 kg)     ASSESSMENT & PLAN:   1. CAD status post CABG with recent NSTEMI: Currently chest pain-free.  Since his hospital discharge she has had 1 episode of 10 out of 10 chest pain which lasted for approximately 10 minutes and spontaneously resolved.  Plans for diagnostic R/LHC were deferred during his recent admission given dyspnea and worsening renal function.  Respiratory status is  significantly improved at this time.  We will check a BMP and discussed these results with interventional cardiology for recommendations regarding timing of diagnostic R/LHC.  He is aware this catheterization certainly may place him on dialysis permanently and accepts this risk.  Further recommendations regarding potential cardiac cath pending discussion with interventional cardiology.  Given his episode of chest pain last evening we will titrate Imdur to 90 mg daily.  He will otherwise continue aspirin, Plavix, amlodipine, carvedilol, and simvastatin.  He remains on long-term dual antiplatelet therapy given extensive generalized atherosclerosis including CAD, PAD, and carotid artery disease.  Risks and benefits of cardiac catheterization have been discussed with the patient including risks of bleeding, bruising, infection, kidney damage, stroke, heart attack, and death. The  patient understands these risks and is willing to proceed with the procedure. All questions have been answered and concerns listened to.   2. HFpEF/pulmonary hypertension: Volume status is somewhat difficult to gauge on exam given body habitus though he appears euvolemic and well compensated.  Weight is down 4 pounds today when compared to his last clinic weight.  Check BMP.  For now, he remains on torsemide 40 mg daily.  CHF education.  3. Aortic stenosis: Moderate by most recent echo.  Potential R/LHC for further evaluation as outlined above.  Further recommendations pending  4. CKD stage IV: Followed by nephrology.  Check BMP in the medical mall today.  Based on these results, will discuss [facility] with interventional cardiology as outlined above.  He is aware proceeding with LHC is likely to place him on permanent dialysis and accepts this.  5. HTN: Blood pressure is mildly elevated today at 144/88.  Increase Imdur as outlined above.  Otherwise, he will continue amlodipine, carvedilol, and torsemide.  6. HLD: LDL 56.  Remains on  simvastatin.  7. Carotid artery disease: Stable moderate R ICA stenosis in 12/2019.  Follow-up imaging in 12 months.  He remains on aspirin, Plavix, and statin.  Disposition: F/u with Dr. Fletcher Anon or an APP in 3 to 4 weeks.   Medication Adjustments/Labs and Tests Ordered: Current medicines are reviewed at length with the patient today.  Concerns regarding medicines are outlined above. Medication changes, Labs and Tests ordered today are summarized above and listed in the Patient Instructions accessible in Encounters.   Signed, Christell Faith, PA-C 02/23/2020 8:47 AM     [place] [address] [place] [address] [place], [place] [address] [phone]

## 2020-02-22 NOTE — Telephone Encounter (Signed)
That is fine 

## 2020-02-22 NOTE — Patient Outreach (Signed)
[facility]) Care Management  02/22/2020  [name] [name] Jan 01, 1956 [id number]   Covering for assigned RNCM L. Zigmund [name].    Referral received as member was recently discharged from hospital after being diagnosed with NSTEMI.  Call placed to member, successful however he state this is not a good time to talk.  Will send outreach letter and provide update to assigned RNCM to call back within the next 3-4 business days.  Valente David, [place], MSN [place] [phone]

## 2020-02-23 ENCOUNTER — Other Ambulatory Visit: Payer: Self-pay

## 2020-02-23 ENCOUNTER — Encounter: Payer: Self-pay | Admitting: Physician Assistant

## 2020-02-23 ENCOUNTER — Telehealth: Payer: Self-pay

## 2020-02-23 ENCOUNTER — Ambulatory Visit (INDEPENDENT_AMBULATORY_CARE_PROVIDER_SITE_OTHER): Payer: Medicare Other | Admitting: Internal Medicine

## 2020-02-23 ENCOUNTER — Ambulatory Visit: Payer: Medicare Other | Admitting: Internal Medicine

## 2020-02-23 ENCOUNTER — Other Ambulatory Visit
Admission: RE | Admit: 2020-02-23 | Discharge: 2020-02-23 | Disposition: A | Discharge status: Home / Self Care | Payer: Medicare Other | Attending: Physician Assistant | Admitting: Physician Assistant

## 2020-02-23 ENCOUNTER — Ambulatory Visit (INDEPENDENT_AMBULATORY_CARE_PROVIDER_SITE_OTHER): Payer: Medicare Other | Admitting: Physician Assistant

## 2020-02-23 ENCOUNTER — Encounter: Payer: Self-pay | Admitting: Internal Medicine

## 2020-02-23 VITALS — BP 144/88 | HR 81 | Ht 67.0 in | Wt 231.0 lb

## 2020-02-23 DIAGNOSIS — I214 Non-ST elevation (NSTEMI) myocardial infarction: Secondary | ICD-10-CM | POA: Insufficient documentation

## 2020-02-23 DIAGNOSIS — I70222 Atherosclerosis of native arteries of extremities with rest pain, left leg: Secondary | ICD-10-CM | POA: Diagnosis not present

## 2020-02-23 DIAGNOSIS — I272 Pulmonary hypertension, unspecified: Secondary | ICD-10-CM

## 2020-02-23 DIAGNOSIS — N184 Chronic kidney disease, stage 4 (severe): Secondary | ICD-10-CM

## 2020-02-23 DIAGNOSIS — E1122 Type 2 diabetes mellitus with diabetic chronic kidney disease: Secondary | ICD-10-CM | POA: Diagnosis not present

## 2020-02-23 DIAGNOSIS — I25118 Atherosclerotic heart disease of native coronary artery with other forms of angina pectoris: Secondary | ICD-10-CM | POA: Diagnosis not present

## 2020-02-23 DIAGNOSIS — I5032 Chronic diastolic (congestive) heart failure: Secondary | ICD-10-CM

## 2020-02-23 DIAGNOSIS — E785 Hyperlipidemia, unspecified: Secondary | ICD-10-CM

## 2020-02-23 DIAGNOSIS — I2510 Atherosclerotic heart disease of native coronary artery without angina pectoris: Secondary | ICD-10-CM | POA: Diagnosis not present

## 2020-02-23 DIAGNOSIS — I25119 Atherosclerotic heart disease of native coronary artery with unspecified angina pectoris: Secondary | ICD-10-CM | POA: Diagnosis not present

## 2020-02-23 DIAGNOSIS — I350 Nonrheumatic aortic (valve) stenosis: Secondary | ICD-10-CM | POA: Diagnosis not present

## 2020-02-23 DIAGNOSIS — I10 Essential (primary) hypertension: Secondary | ICD-10-CM

## 2020-02-23 DIAGNOSIS — E1151 Type 2 diabetes mellitus with diabetic peripheral angiopathy without gangrene: Secondary | ICD-10-CM | POA: Diagnosis not present

## 2020-02-23 DIAGNOSIS — I5033 Acute on chronic diastolic (congestive) heart failure: Secondary | ICD-10-CM | POA: Diagnosis not present

## 2020-02-23 DIAGNOSIS — I5031 Acute diastolic (congestive) heart failure: Secondary | ICD-10-CM

## 2020-02-23 DIAGNOSIS — F331 Major depressive disorder, recurrent, moderate: Secondary | ICD-10-CM

## 2020-02-23 DIAGNOSIS — I779 Disorder of arteries and arterioles, unspecified: Secondary | ICD-10-CM | POA: Diagnosis not present

## 2020-02-23 DIAGNOSIS — R21 Rash and other nonspecific skin eruption: Secondary | ICD-10-CM | POA: Diagnosis not present

## 2020-02-23 LAB — CBC WITH DIFFERENTIAL/PLATELET
Abs Immature Granulocytes: 0.03 10*3/uL (ref 0.00–0.07)
Basophils Absolute: 0.1 10*3/uL (ref 0.0–0.1)
Basophils Relative: 1 %
Eosinophils Absolute: 0.4 10*3/uL (ref 0.0–0.5)
Eosinophils Relative: 5 %
HCT: 32.1 % — ABNORMAL LOW (ref 39.0–52.0)
Hemoglobin: 10.1 g/dL — ABNORMAL LOW (ref 13.0–17.0)
Immature Granulocytes: 0 %
Lymphocytes Relative: 22 %
Lymphs Abs: 1.8 10*3/uL (ref 0.7–4.0)
MCH: 31.5 pg (ref 26.0–34.0)
MCHC: 31.5 g/dL (ref 30.0–36.0)
MCV: 100 fL (ref 80.0–100.0)
Monocytes Absolute: 0.6 10*3/uL (ref 0.1–1.0)
Monocytes Relative: 8 %
Neutro Abs: 5.2 10*3/uL (ref 1.7–7.7)
Neutrophils Relative %: 64 %
Platelets: 173 10*3/uL (ref 150–400)
RBC: 3.21 MIL/uL — ABNORMAL LOW (ref 4.22–5.81)
RDW: 14.2 % (ref 11.5–15.5)
WBC: 8.1 10*3/uL (ref 4.0–10.5)
nRBC: 0 % (ref 0.0–0.2)

## 2020-02-23 LAB — BASIC METABOLIC PANEL
Anion gap: 8 (ref 5–15)
BUN: 53 mg/dL — ABNORMAL HIGH (ref 8–23)
CO2: 26 mmol/L (ref 22–32)
Calcium: 8.7 mg/dL — ABNORMAL LOW (ref 8.9–10.3)
Chloride: 104 mmol/L (ref 98–111)
Creatinine, Ser: 3.08 mg/dL — ABNORMAL HIGH (ref 0.61–1.24)
GFR calc Af Amer: 23 mL/min — ABNORMAL LOW (ref >60)
GFR calc non Af Amer: 20 mL/min — ABNORMAL LOW (ref >60)
Glucose, Bld: 157 mg/dL — ABNORMAL HIGH (ref 70–99)
Potassium: 4.8 mmol/L (ref 3.5–5.1)
Sodium: 138 mmol/L (ref 135–145)

## 2020-02-23 MED ORDER — NITROGLYCERIN 0.4 MG SL SUBL: 0.4000 mg | SUBLINGUAL_TABLET | SUBLINGUAL | 2 refills | Status: DC | PRN

## 2020-02-23 MED ORDER — BUPROPION HCL ER (SR) 150 MG PO TB12: 150.0000 mg | ORAL_TABLET | Freq: Every day | ORAL | 3 refills | Status: DC

## 2020-02-23 MED ORDER — ISOSORBIDE MONONITRATE ER 30 MG PO TB24: 90.0000 mg | ORAL_TABLET | Freq: Every day | ORAL | 2 refills | Status: DC

## 2020-02-23 MED ORDER — SERTRALINE HCL 100 MG PO TABS
100.0000 mg | ORAL_TABLET | Freq: Every day | ORAL | 3 refills | Status: AC
Start: 2020-02-23 — End: ?

## 2020-02-23 NOTE — Patient Instructions (Signed)
Medication Instructions:  Your physician has recommended you make the following change in your medication:  1- INCREASE Imdur to 90 mg (3 tablets) by mouth once a day. 2- Nitroglycerin as needed for chest pain - Dissolve 1 tablet (0.4 mg) under your tongue every 5 minutes as needed for chest pain. Do not take more than 3 doses. If chest pain does not resolve, then call 911 or go to the Emergency Room.   *If you need a refill on your cardiac medications before your next appointment, please call your pharmacy*  Lab Work: Your physician recommends that you return for lab work in: [place] as you leave for BMET, CBC. - Please go to the [facility]. You will check in at the front desk to the right as you walk into the atrium. Valet Parking is offered if needed. - No appointment needed. You may go any day between 7 am and 6 pm.  If you have labs (blood work) drawn today and your tests are completely normal, you will receive your results only by: [name] MyChart Message (if you have MyChart) OR . A paper copy in the mail If you have any lab test that is abnormal or we need to change your treatment, we will call you to review the results.  Testing/Procedures:  The following instructions have been included in case you are scheduled for Heart Catheterization at [facility] once the lab work has been reviewed.   You are scheduled for a Cardiac Catheterization on _____________________  with Dr. [name].  1. Please arrive at the [facility] (Main Entrance A) at [facility]: [address] [place], [place] [address] at _____________ (This time is two hours before your procedure to ensure your preparation). Free valet parking service is available.   Special note: Every effort is made to have your procedure done on time. Please understand that emergencies sometimes delay scheduled procedures.  2. Diet: Do not eat solid foods after midnight.  The patient may have clear liquids until 5am  upon the day of the procedure.  3. Labs: You will need to have blood drawn on (TO BE DETERMINED)  You do not need to be fasting.  4. Medication instructions in preparation for your procedure:   Contrast Allergy: Yes, Please take Prednisone 50mg  by mouth at: Thirteen hours prior to cath ____________________________ Seven hours prior to cath _____________________________ And prior to leaving home please take last dose of Prednisone 50mg  and Benadryl 50mg  by mouth.  On the morning of your procedure, take your Aspirin and any morning medicines NOT listed above.  You may use sips of water.  5. Plan for one night stay--bring personal belongings. 6. Bring a current list of your medications and current insurance cards. 7. You MUST have a responsible person to drive you home. 8. Someone MUST be with you the first 24 hours after you arrive home or your discharge will be delayed. 9. Please wear clothes that are easy to get on and off and wear slip-on shoes.  Thank you for allowing [place] to care for you!   -- [facility]    Follow-Up: At [facility], you and your health needs are our priority.  As part of our continuing mission to provide you with exceptional heart care, we have created designated Provider Care Teams.  These Care Teams include your primary Cardiologist (physician) and Advanced Practice Providers (APPs -  Physician Assistants and Nurse Practitioners) who all work together to  provide you with the care you need, when you need it.  We recommend signing up for the patient portal called "MyChart".  Sign up information is provided on this After Visit Summary.  MyChart is used to connect with patients for Virtual Visits (Telemedicine).  Patients are able to view lab/test results, encounter notes, upcoming appointments, etc.  Non-urgent messages can be sent to your provider as well.   To learn more about what you can do with MyChart, go to  [email or web address].    Your next appointment:   3-4 week(s)  The format for your next appointment:   In Person  Provider:    You may see [name], MD or one of the following Advanced Practice Providers on your designated Care Team:    Christell Faith, PA-C    Angiogram, Care After This sheet gives you information about how to care for yourself after your procedure. Your doctor may also give you more specific instructions. If you have problems or questions, contact your doctor. Follow these instructions at home: Insertion site care  Follow instructions from your doctor about how to take care of your long, thin tube (catheter) insertion area. Make sure you: ? Wash your hands with soap and water before you change your bandage (dressing). If you cannot use soap and water, use hand sanitizer. ? Change your bandage as told by your doctor. ? Leave stitches (sutures), skin glue, or skin tape (adhesive) strips in place. They may need to stay in place for 2 weeks or longer. If tape strips get loose and curl up, you may trim the loose edges. Do not remove tape strips completely unless your doctor says it is okay.  Do not take baths, swim, or use a hot tub until your doctor says it is okay.  You may shower 24-48 hours after the procedure or as told by your doctor. ? Gently wash the area with plain soap and water. ? Pat the area dry with a clean towel. ? Do not rub the area. This may cause bleeding.  Do not apply powder or lotion to the area. Keep the area clean and dry.  Check your insertion area every day for signs of infection. Check for: ? More redness, swelling, or pain. ? Fluid or blood. ? Warmth. ? Pus or a bad smell. Activity  Rest as told by your doctor, usually for 1-2 days.  Do not lift anything that is heavier than 10 lbs. (4.5 kg) or as told by your doctor.  Do not drive for 24 hours if you were given a medicine to help you relax (sedative).  Do not drive or use  heavy machinery while taking prescription pain medicine. General instructions   Go back to your normal activities as told by your doctor, usually in about a week. Ask your doctor what activities are safe for you.  If the insertion area starts to bleed, lie flat and put pressure on the area. If the bleeding does not stop, get help right away. This is an emergency.  Drink enough fluid to keep your pee (urine) clear or pale yellow.  Take over-the-counter and prescription medicines only as told by your doctor.  Keep all follow-up visits as told by your doctor. This is important. Contact a doctor if:  You have a fever.  You have chills.  You have more redness, swelling, or pain around your insertion area.  You have fluid or blood coming from your insertion area.  The insertion area feels warm  to the touch.  You have pus or a bad smell coming from your insertion area.  You have more bruising around the insertion area.  Blood collects in the tissue around the insertion area (hematoma) that may be painful to the touch. Get help right away if:  You have a lot of pain in the insertion area.  The insertion area swells very fast.  The insertion area is bleeding, and the bleeding does not stop after holding steady pressure on the area.  The area near or just beyond the insertion area becomes pale, cool, tingly, or numb. These symptoms may be an emergency. Do not wait to see if the symptoms will go away. Get medical help right away. Call your local emergency services (911 in the [place] Do not drive yourself to the hospital. Summary  After the procedure, it is common to have bruising and tenderness at the long, thin tube insertion area.  After the procedure, it is important to rest and drink plenty of fluids.  Do not take baths, swim, or use a hot tub until your doctor says it is okay to do so. You may shower 24-48 hours after the procedure or as told by your doctor.  If the  insertion area starts to bleed, lie flat and put pressure on the area. If the bleeding does not stop, get help right away. This is an emergency. This information is not intended to replace advice given to you by your health care provider. Make sure you discuss any questions you have with your health care provider. Document Revised: 06/21/2017 Document Reviewed: 07/03/2016 Elsevier Patient Education  2020 [employer].

## 2020-02-23 NOTE — Progress Notes (Signed)
Subjective:    Patient ID: [name] [name], male    DOB: 1955/11/01, 64 y.o.   MRN: [id number]  HPI Here for hospital follow up This visit occurred during the SARS-CoV-2 public health emergency.  Safety protocols were in place, including screening questions prior to the visit, additional usage of staff PPE, and extensive cleaning of exam room while observing appropriate contact time as indicated for disinfecting solutions.   Reviewed hospital records and discharge summary  Had been seen for possible kidney stone and had CT done (7/19) That was benign so he went home Then returned to ER for "tightness in my chest like a boulder" Had SOB, diaphoresis and nausea Diagnosed with NSTEMI---troponin peaked at 470  Had worsening of renal insufficiency CKD 4 with GFR of 17 Held off on cardiac cath due to this Then he wanted to go home Still had some chest pain though  Sent home on isosorbide  Got nitro--hasn't used it though Sertraline, bupropion and duloxetine all stopped due to the CKD 4 This has caused him distress  Current Outpatient Medications on File Prior to Visit  Medication Sig Dispense Refill  . albuterol (PROAIR HFA) 108 (90 Base) MCG/ACT inhaler Inhale 2 puffs into the lungs every 6 (six) hours as needed for shortness of breath. 18 g 3  . amLODipine (NORVASC) 2.5 MG tablet Take 1 tablet (2.5 mg total) by mouth daily. 30 tablet 0  . aspirin 81 MG tablet Take 81 mg by mouth daily.    . busPIRone (BUSPAR) 10 MG tablet TAKE 2 TABLETS(20 MG) BY MOUTH TWICE DAILY 360 tablet 3  . calcitRIOL (ROCALTROL) 0.25 MCG capsule Take 0.25 mcg by mouth 3 (three) times a week. Monday then Wednesday then Friday    . carvedilol (COREG) 12.5 MG tablet Take 1 tablet (12.5 mg total) by mouth 2 (two) times daily. (Patient taking differently: Take 12.5 mg by mouth daily. ) 180 tablet 1  . cyclobenzaprine (FLEXERIL) 10 MG tablet Take 10 mg by mouth 3 (three) times daily as needed for muscle spasms.       . Dextran 70-Hypromellose (ARTIFICIAL TEARS) 0.1-0.3 % SOLN Place 1 drop into both eyes 4 (four) times daily as needed (dry eyes).    . finasteride (PROSCAR) 5 MG tablet Take 5 mg by mouth daily.    . Multiple Vitamin (MULTIVITAMIN) capsule Take 1 capsule by mouth daily.     [name] nystatin (MYCOSTATIN/NYSTOP) powder Apply 1 application topically 3 (three) times daily. Substitute other antifungal powder as needed if insurance issues 60 g 2  . oxyCODONE-acetaminophen (PERCOCET) 5-325 MG tablet Take 1 tablet by mouth every 4 (four) hours as needed for severe pain. 20 tablet 0  . simvastatin (ZOCOR) 20 MG tablet TAKE 1 TABLET(20 MG) BY MOUTH DAILY AT 6 PM (Patient taking differently: Take 20 mg by mouth every evening. ) 90 tablet 2  . tamsulosin (FLOMAX) 0.4 MG CAPS capsule Take 1 capsule by mouth daily (Patient taking differently: Take 0.4 mg by mouth daily. ) 90 capsule 3  . torsemide (DEMADEX) 20 MG tablet Take 2 tablets (40 mg total) by mouth daily. 30 tablet 0   No current facility-administered medications on file prior to visit.    Allergies  Allergen Reactions  . Contrast Media [Iodinated Diagnostic Agents] Rash and Other (See Comments)    Got very hot and red   . Glipizide Other (See Comments)    ANTIDIABETICS. Burning  . Metrizamide Other (See Comments)    Got very  hot and red  . Penicillins Hives and Swelling    Has patient had a PCN reaction causing immediate rash, facial/tongue/throat swelling, SOB or lightheadedness with hypotension: yes Has patient had a PCN reaction causing severe rash involving mucus membranes or skin necrosis: no  Has patient had a PCN reaction that required hospitalization: yes Has patient had a PCN reaction occurring within the last 10 years: no If all of the above answers are "NO", then may proceed with Cephalosporin use.     Past Medical History:  Diagnosis Date  . Adenomatous colon polyp   . Allergy   . Angina at [facility])    chronic  . Anxiety    . Arthritis    RA  . Bell's palsy 2007  . Carotid artery occlusion   . Cataract    Dr. Dawna Part  . Coronary artery disease   . Depression   . Diabetes mellitus   . Diverticulosis   . Duodenitis   . DVT (deep venous thrombosis) (HCC)    in leg  . Dyspnea    DOE  . Gastropathy   . GERD (gastroesophageal reflux disease)   . Heart murmur   . Helicobacter pylori gastritis   . Hyperlipidemia   . Hypertension   . Kidney failure   . Mild aortic stenosis   . Neuropathy   . Obesity   . Peripheral vascular disease ([place])   . Post splenectomy syndrome   . Psoriasis   . Psoriatic arthritis ([place])   . Renal disorder    stage 4 kidney failure  . Sleep apnea    uses cpap  . SOB (shortness of breath)   . Stroke ([place])   . Tobacco use disorder    recently quit  . Tubular adenoma 08/2013   Dr. Hilarie Fredrickson  . Weak urinary stream     Past Surgical History:  Procedure Laterality Date  . CARDIAC CATHETERIZATION  08/2010   LAD: 80% ISR, RCA: 80% ostial, OM 80-90%  . CAROTID ENDARTERECTOMY  08/2010   left/ Dr. Kellie Simmering  . CATARACT EXTRACTION W/PHACO Left 07/09/2017   Procedure: CATARACT EXTRACTION PHACO AND INTRAOCULAR LENS PLACEMENT (IOC);  Surgeon: Birder Robson, MD;  Location: [facility] ORS;  Service: Ophthalmology;  Laterality: Left;  [place] 00:23 AP% 14.2 CDE 3.26 Fluid pack lot # [id number]  . CATARACT EXTRACTION W/PHACO Right 09/10/2017   Procedure: CATARACT EXTRACTION PHACO AND INTRAOCULAR LENS PLACEMENT (IOC);  Surgeon: Birder Robson, MD;  Location: [facility] ORS;  Service: Ophthalmology;  Laterality: Right;  [place] 00:26.4 AP% 17.3 CDE 4.59 Fluid Pack Lot # [id number]  . COLONOSCOPY    . CORONARY ANGIOPLASTY     LAD: before CABG  . CORONARY ARTERY BYPASS GRAFT  09/06/2010   At [facility]: LIMA to LAD, left radial to RCA, sequential SVG to OM3 and 4  . heart stents  Jan 2011   leg stents 06/2009 and 03/2010  . LOWER EXTREMITY ANGIOGRAPHY Left 06/09/2019   Procedure: LOWER EXTREMITY ANGIOGRAPHY;   Surgeon: Katha Cabal, MD;  Location: [place] [facility];  Service: Cardiovascular;  Laterality: Left;  . PTA of illiac and SFA  multiple   Dr. Ronalee Belts, s/p revision 11/2012  . RIGHT/LEFT HEART CATH AND CORONARY/GRAFT ANGIOGRAPHY N/A 02/15/2020   Procedure: RIGHT/LEFT HEART CATH AND CORONARY/GRAFT ANGIOGRAPHY;  Surgeon: Wellington Hampshire, MD;  Location: [place] [facility];  Service: Cardiovascular;  Laterality: N/A;  . SPLENECTOMY    . VASCULAR SURGERY     LEG STENTS  Family History  Problem Relation Age of Onset  . Lung cancer Father 87  . Arthritis Father   . Breast cancer Sister 48  . Alcoholism Sister   . Dementia Mother   . Alzheimer's disease Mother   . Arthritis Mother   . Alcoholism Brother   . Epilepsy Sister   . Diabetes Paternal Grandfather   . Colon polyps Paternal Grandfather 54  . Alcoholism Sister   . Alcoholism Sister   . Alcoholism Brother     Social History   Socioeconomic History  . Marital status: Widowed    Spouse name: Not on file  . Number of children: 2  . Years of education: Not on file  . Highest education level: Not on file  Occupational History  . Occupation: Chief Operating Officer at [employer]: Disabled mostly due to neuropathy  Tobacco Use  . Smoking status: Former Smoker    Packs/day: 0.75    Years: 40.00    Pack years: 30.00    Types: Cigarettes    Quit date: 04/15/2017    Years since quitting: 2.8  . Smokeless tobacco: Never Used  . Tobacco comment: 1 cigarette a day  Vaping Use  . Vaping Use: Never used  Substance and Sexual Activity  . Alcohol use: No  . Drug use: No  . Sexual activity: Never  Other Topics Concern  . Not on file  Social History Narrative   Wife died 01-25-2019      Has living will   Gearldine Shown and his wife [name] [name] should be his health care POA   Would accept resuscitation --but no prolonged ventilation   No tube feeds if cognitively unaware   Social Determinants of Health   Financial  Resource Strain: Low Risk   . Difficulty of Paying Living Expenses: Not very hard  Food Insecurity: No Food Insecurity  . Worried About Charity fundraiser in the Last Year: Never true  . Ran Out of Food in the Last Year: Never true  Transportation Needs: No Transportation Needs  . Lack of Transportation (Medical): No  . Lack of Transportation (Non-Medical): No  Physical Activity:   . Days of Exercise per Week:   . Minutes of Exercise per Session:   Stress: No Stress Concern Present  . Feeling of Stress : Only a little  Social Connections: Unknown  . Frequency of Communication with Friends and Family: More than three times a week  . Frequency of Social Gatherings with Friends and Family: Not on file  . Attends Religious Services: Not on file  . Active Member of Clubs or Organizations: Not on file  . Attends Archivist Meetings: Not on file  . Marital Status: Not on file  Intimate Partner Violence: Unknown  . Fear of Current or Ex-Partner: No  . Emotionally Abused: No  . Physically Abused: No  . Sexually Abused: Not on file   Review of Systems Eating okay now---trying to follow renal diet Weighing daily---has gone up since discharge    Objective:   Physical Exam Constitutional:      Appearance: Normal appearance.  Cardiovascular:     Rate and Rhythm: Normal rate and regular rhythm.     Heart sounds: No gallop.      Comments: Prominent systolic murmur --blowing in aortic area and coarse at apex Pulmonary:     Effort: Pulmonary effort is normal.     Breath sounds: Normal breath sounds. No wheezing or rales.  Musculoskeletal:  Cervical back: Neck supple.     Right lower leg: No edema.     Left lower leg: No edema.  Lymphadenopathy:     Cervical: No cervical adenopathy.  Neurological:     Mental Status: He is alert.  Psychiatric:        Mood and Affect: Mood normal.        Behavior: Behavior normal.            Assessment & Plan:

## 2020-02-23 NOTE — Assessment & Plan Note (Signed)
Stress with this hospitalization Will stop the duloxetine due to CKD Continue buspirone, bupropion (150mg  daily) and the sertraline

## 2020-02-23 NOTE — Patient Instructions (Signed)
Weigh yourself every day. If your weight at home is 225# or more---please take a second dose of 2 -- 20mg  torsemide around lunch time.

## 2020-02-23 NOTE — Telephone Encounter (Signed)
Verbal orders given to [name] 

## 2020-02-23 NOTE — Assessment & Plan Note (Signed)
Had fluid overload in hospital Borderline EF before this MI Unclear if he has reduced EF now Discussed fluid status---especially with CKD Asked him to take second torsemide dose if weight 225# or more at home

## 2020-02-23 NOTE — Assessment & Plan Note (Signed)
Recent NSTEMI No cath due to kidney failure Still with some mild symptoms---considering LHC if possible Now on imdur

## 2020-02-23 NOTE — Assessment & Plan Note (Signed)
Goes back to Dr [name] later this month

## 2020-02-23 NOTE — Telephone Encounter (Signed)
[name] [name] with [facility] left v/m requesting verbal orders for [facility] PT eval and palliative care consult.

## 2020-02-24 ENCOUNTER — Telehealth: Payer: Self-pay

## 2020-02-24 ENCOUNTER — Ambulatory Visit: Payer: Medicare Other

## 2020-02-24 ENCOUNTER — Encounter: Payer: Self-pay | Admitting: *Deleted

## 2020-02-24 ENCOUNTER — Encounter (INDEPENDENT_AMBULATORY_CARE_PROVIDER_SITE_OTHER): Payer: Medicare Other

## 2020-02-24 ENCOUNTER — Other Ambulatory Visit: Payer: Self-pay | Admitting: *Deleted

## 2020-02-24 ENCOUNTER — Ambulatory Visit (INDEPENDENT_AMBULATORY_CARE_PROVIDER_SITE_OTHER): Payer: Medicare Other | Admitting: Nurse Practitioner

## 2020-02-24 MED ORDER — TORSEMIDE 20 MG PO TABS: 40.0000 mg | ORAL_TABLET | Freq: Every day | ORAL | 5 refills | Status: DC

## 2020-02-24 NOTE — Telephone Encounter (Signed)
-----   Message from [name], PA-C sent at 02/23/2020  1:57 PM EDT ----- Renal function has improved back to his approximate baseline. Potassium at goal. Random glucose mildly elevated. Blood count stable.  Recommendations: -I have discussed his case with Dr. Fletcher Anon with recommendations for [place] to optimize medical therapy at this time with close follow-up -Continue with planned escalation of Imdur to 90 mg daily as discussed at our office visit -Please schedule the patient to see either myself or Dr. Fletcher Anon in the office in 2 to 3 weeks for further optimization of antianginal therapy and if he has refractory angina despite this Dr. Fletcher Anon we will plan to proceed with diagnostic R/LHC at [facility]

## 2020-02-24 NOTE — Telephone Encounter (Signed)
That is fine Make sure he knows a palliative care person will also be coming

## 2020-02-24 NOTE — Telephone Encounter (Signed)
Attempted to call patient. LMTCB 02/24/2020   

## 2020-02-24 NOTE — Telephone Encounter (Signed)
Call to patient to review labs.    Pt verbalized understanding and has no further questions at this time.    Advised pt to call for any further questions or concerns.  Made appt for 2-3 week time frame. Aug 20th with Christell Faith, PA.

## 2020-02-24 NOTE — Chronic Care Management (AMB) (Signed)
Patient needs a refill on torsemide 20 mg - 2 tablets daily with an additional 2 tablets as needed for weight gain. He was only given a 15 day supply at discharge from the hospital. Please send to [facility].  Thanks!  Debbora Dus, PharmD Clinical Pharmacist [facility] [phone]

## 2020-02-24 NOTE — Telephone Encounter (Signed)
Rx sent electronically.  

## 2020-02-24 NOTE — Chronic Care Management (AMB) (Addendum)
Chronic Care Management Pharmacy  Name: [name] [name]  MRN: [id number] DOB: March 21, 1956  Chief Complaint/ HPI  [name] [name],  64 y.o., male presents for their Follow-Up CCM visit with the clinical pharmacist via telephone.   PCP : [name], MD  Their chronic conditions include: hypertension, hyperlipidemia, COPD, diabetes  Completed medication reconciliation post-hospital discharge:  Recent office visits changes:  02/23/20 - Cardiology - increase Imdur to 30 mg 3 tablets once daily, refill nitroglycerin, resume torsemide 40 mg daily. Continue amlodipine, carvedilol, and torsemide. Continue simvastatin, aspirin, Plavix.   02/23/20 - PCP - Continue buspirone, bupropion (150mg  daily) and the sertraline. Stop duloxetine due to CKD. Weigh yourself every day. If your weight at home is 225# or more---please take a second dose of 2, 20mg  torsemide around lunch time.  Hospital discharge med rec 02/18/20:  Discharged from hospital for NSTEMI 02/18/20, advanced renal disease, very close to dialysis, hold diuretics for now, ARB switched to Norvasc, continue BB.  Stop: bupropion, fluoxetine, furosemide, losartan, sertraline  Continue: albuterol, aspirin 81 mg, buspirone 10 mg, calcitriol 0.25 mg, carvedilol 12.5 mg, clopidogrel 75 mg, cyclobenzaprine 10, finasteride 5 mg, multivitamin, nitroglycerin, nystatin powder, oxycodone-apap, simvastatin, tamsulosin   Start: amlodipine, isosorbide mononitrate  Allergies  Allergen Reactions  . Contrast Media [Iodinated Diagnostic Agents] Rash and Other (See Comments)    Got very hot and red   . Glipizide Other (See Comments)    ANTIDIABETICS. Burning  . Metrizamide Other (See Comments)    Got very hot and red  . Penicillins Hives and Swelling    Has patient had a PCN reaction causing immediate rash, facial/tongue/throat swelling, SOB or lightheadedness with hypotension: yes Has patient had a PCN reaction causing severe rash involving  mucus membranes or skin necrosis: no  Has patient had a PCN reaction that required hospitalization: yes Has patient had a PCN reaction occurring within the last 10 years: no If all of the above answers are "NO", then may proceed with Cephalosporin use.    Medications: Outpatient Encounter Medications as of 02/24/2020  Medication Sig  . albuterol (PROAIR HFA) 108 (90 Base) MCG/ACT inhaler Inhale 2 puffs into the lungs every 6 (six) hours as needed for shortness of breath.  [name] amLODipine (NORVASC) 2.5 MG tablet Take 1 tablet (2.5 mg total) by mouth daily.  [name] aspirin 81 MG tablet Take 81 mg by mouth daily.  [name] buPROPion (WELLBUTRIN SR) 150 MG 12 hr tablet Take 1 tablet (150 mg total) by mouth daily.  . busPIRone (BUSPAR) 10 MG tablet TAKE 2 TABLETS(20 MG) BY MOUTH TWICE DAILY  . calcitRIOL (ROCALTROL) 0.25 MCG capsule Take 0.25 mcg by mouth 3 (three) times a week. Monday then Wednesday then Friday  . carvedilol (COREG) 12.5 MG tablet Take 1 tablet (12.5 mg total) by mouth 2 (two) times daily. (Patient taking differently: Take 12.5 mg by mouth daily. )  . clopidogrel (PLAVIX) 75 MG tablet Take 75 mg by mouth daily.  . cyclobenzaprine (FLEXERIL) 10 MG tablet Take 10 mg by mouth 3 (three) times daily as needed for muscle spasms.   . Dextran 70-Hypromellose (ARTIFICIAL TEARS) 0.1-0.3 % SOLN Place 1 drop into both eyes 4 (four) times daily as needed (dry eyes).  . finasteride (PROSCAR) 5 MG tablet Take 5 mg by mouth daily.  . isosorbide mononitrate (IMDUR) 30 MG 24 hr tablet Take 3 tablets (90 mg total) by mouth daily.  . Multiple Vitamin (MULTIVITAMIN) capsule Take 1 capsule by mouth daily.   [name]  nitroGLYCERIN (NITROSTAT) 0.4 MG SL tablet Place 1 tablet (0.4 mg total) under the tongue every 5 (five) minutes as needed. Maximum of 3 doses.  [name] nystatin (MYCOSTATIN/NYSTOP) powder Apply 1 application topically 3 (three) times daily. Substitute other antifungal powder as needed if insurance issues  .  oxyCODONE-acetaminophen (PERCOCET) 5-325 MG tablet Take 1 tablet by mouth every 4 (four) hours as needed for severe pain.  [name] sertraline (ZOLOFT) 100 MG tablet Take 1 tablet (100 mg total) by mouth daily.  . simvastatin (ZOCOR) 20 MG tablet TAKE 1 TABLET(20 MG) BY MOUTH DAILY AT 6 PM (Patient taking differently: Take 20 mg by mouth every evening. )  . tamsulosin (FLOMAX) 0.4 MG CAPS capsule Take 1 capsule by mouth daily (Patient taking differently: Take 0.4 mg by mouth daily. )  . torsemide (DEMADEX) 20 MG tablet Take 2 tablets (40 mg total) by mouth daily.  . [DISCONTINUED] clopidogrel (PLAVIX) 75 MG tablet TAKE 1 TABLET(75 MG) BY MOUTH DAILY (Patient taking differently: Take 75 mg by mouth daily. )  . [DISCONTINUED] isosorbide mononitrate (IMDUR) 60 MG 24 hr tablet Take 1 tablet (60 mg total) by mouth daily.  . [DISCONTINUED] nitroGLYCERIN (NITROSTAT) 0.4 MG SL tablet Place 1 tablet (0.4 mg total) under the tongue every 5 (five) minutes as needed.  . [DISCONTINUED] torsemide (DEMADEX) 20 MG tablet Take 2 tablets (40 mg total) by mouth daily.   No facility-administered encounter medications on file as of 02/24/2020.   Medication Coordination: Medications delivered in VIALS, 30 DS on 02/19/20:  . Calcitriol 0.25 mcg - 1 breakfast M,W,F only  . Buspirone 10 mg - 2 breakfast, 2 bedtime . Carvedilol 12.5 mg - 1 breakfast, 1 bedtime  . Clopidogrel 75 mg - 1 breakfast . Simvastatin 20 mg - 1 bedtime . Aspirin 81 mg - 1 breakfast . Tamsulosin 0.4 mg - 1 bedtime . Finasteride 5 mg - 1 breakfast . Amlodipine 2.5 mg - 1 breakfast . Isosorbide mononitrate 60 mg - 1 breakfast  Meds scheduled for delivery in [facility] today 02/24/20: (sending quantity to last until above medications run out)  Torsemide 20 mg - 2 tablets daily (resumed by cardio/PCP 8/3)  Sertraline 100 mg - 1 daily (resumed by PCP 8/3)  Bupropion SR 150 mg -  1 daily (resumed by PCP 8/3)  Isosorbide mononitrate 30 mg - 3 daily (dose  increased per cardio 8/3)  Nitroglycerin 0.4 mg SL - PRN (started by cardio 8/3)  Counseled patient on medication changes and appropriate use of nitroglycerin.  Refills requested: torsemide  Next CCM delivery expected 03/17/20  Debbora Dus, PharmD Clinical Pharmacist [facility] [phone]

## 2020-02-24 NOTE — Patient Outreach (Addendum)
[facility]) Care Management  02/24/2020  [name] [name] Nov 11, 1955 [id number]  Referral Received 7/30 [facility] Discharged: NSTEMI Transition of care completed by primary provider Telephone Assessment-Enrolled HF program  RN spoke with pt today and introduced [facility] services and the purpose for today's call. Discussed his recent hospitalization and how he is managing his heart failure. Pt has indicated he needs more knowledge on this condition and receptive to enrolling into the [facility] program and services. RN educated pt on HF and offered to send printable educational information on this medical condition. Will discuss EMMI information and send this information in his packet. Will stress the importance of documenting daily weights every morning after emptying his bladder for the most adaquate weight. Pt verified his baseline weight is 225 lbs and this was his weight this morning.  Will verify pt is aware to contact his provider ([facility]) with any abnormal symptoms. Verified pt is knowledgeable about his torsemide and why this is importance related to his fluid retention if experienced. Pt verified an understanding. Educated on the HF action plan and what to do if acute symptoms are encountered. Educated pt on if he gains 3 lbs over night or 5 lbs within one week to consult his provider for interventions.   Generated a plan of care and discussed all goals and interventions. Will provide tools and education printable information to assist pt with managing this condition. No other resources needed at this time. Also verified pt has sufficient transportation services for all his scheduled medical appointments. All medications reviewed with no needed refills at this time. Pt verbalized an understanding of the plan of care and [facility] participation. Provider contact number for [facility]/RN case manager if needed prior to the next follow up call.  Plan: Will also pt's provider of his disposition with [facility]  services and follow up in a few weeks on pt's management of care.  Goals Addressed              This Visit's Progress     Get better (pt-stated)        CARE PLAN ENTRY (see longtitudinal plan of care for additional care plan information)   Current Barriers:   Knowledge deficit related to basic heart failure pathophysiology and self care management  Knowledge Deficits related to heart failure medications  Case Manager Clinical Goal(s):   Over the next 90 days, patient will verbalize understanding of Heart Failure Action Plan and when to call doctor  Over the next 30 days, patient will take all Heart Failure mediations as prescribed  Over the next 30 days, patient will weigh daily and record (notifying MD of 3 lb weight gain over night or 5 lb in a week)  Interventions:   Basic overview and discussion of pathophysiology of Heart Failure reviewed   Provided verbal education on low sodium diet  Advised patient to weigh each morning after emptying bladder  Discussed importance of daily weight and advised patient to weigh and record daily  Patient Self Care Activities:   Takes Heart Failure Medications as prescribed  Weighs daily and record (notifying MD of 3 lb weight gain over night or 5 lb in a week)  Verbalizes understanding of and follows CHF Action Plan  Adheres to low sodium diet  Initial goal documentation        [name] Mina, RN Care Management Coordinator [place] [facility] [phone]

## 2020-02-24 NOTE — Telephone Encounter (Signed)
Verbal orders given to [facility]. She said the nurse explained to him yesterday about palliative care and the pt had agreed.

## 2020-02-28 NOTE — Progress Notes (Signed)
Patient ID: [name] [name], male    DOB: 1956/03/29, 64 y.o.   MRN: [id number]  HPI  [name] [name] is a 64 y/o male with a history of carotid diseased, CAD, DM, hyperlipidemia, HTN, CKD, stroke, PVD, aortic stenosis, depression, bell's palsy, angina, anxiety, DVT, diverticulosis, GERD, sleep apnea, previous tobacco use and chronic heart failure.   Echo report from 01/11/20 reviewed and showed an EF of 50-55% along with mild LVH, moderately elevated PA Pressure, mild/moderate [name] and moderate AS.   Catheterization done 02/15/20 aborted due to respiratory distress.   Admitted 02/13/20 due to shortness of breath & chest pain due to NSTEMI. Cardiology consult obtained and also seen by nephrology. Heparin drip along with lasix drip started. Lasix drip transitioned to oral diuretics. ARB discontinued to renal function. Discharged after 5 days. Was in the ED 02/08/20 with flank pain where [name] [name] was treated and released.   [name] [name] presents today for his initial visit with a chief complaint of moderate fatigue upon minimal exertion. [name] [name] describes this as chronic in nature having been present for several months. [name] [name] has associated shortness of breath, chest pain, dizziness, neuropathy and generalized joint pain along with this. [name] [name] denies any difficulty sleeping, abdominal distention, palpitations, pedal edema, cough or weight gain.   Continues to have angina but says that [name] [name] only had to take 1 SL NTG yesterday and the day prior, [name] [name] had to take 3. Discussion being had about doing catheterization in the future.    Past Medical History:  Diagnosis Date  . Adenomatous colon polyp   . Allergy   . Angina at rest [facility])    chronic  . Anxiety   . Arthritis    RA  . Bell's palsy 2007  . Carotid artery occlusion   . Cataract    Dr. Dawna Part  . CHF (congestive heart failure) ([place])   . Coronary artery disease   . Depression   . Diabetes mellitus   . Diverticulosis   . Duodenitis   . DVT (deep venous thrombosis) ([facility])     in leg  . Dyspnea    DOE  . Gastropathy   . GERD (gastroesophageal reflux disease)   . Heart murmur   . Helicobacter pylori gastritis   . Hyperlipidemia   . Hypertension   . Kidney failure   . Mild aortic stenosis   . Neuropathy   . Obesity   . Peripheral vascular disease ([place])   . Post splenectomy syndrome   . Psoriasis   . Psoriatic arthritis ([place])   . Renal disorder    stage 4 kidney failure  . Sleep apnea    uses cpap  . SOB (shortness of breath)   . Stroke ([place])   . Tobacco use disorder    recently quit  . Tubular adenoma 08/2013   Dr. Hilarie Fredrickson  . Weak urinary stream    Past Surgical History:  Procedure Laterality Date  . CARDIAC CATHETERIZATION  08/2010   LAD: 80% ISR, RCA: 80% ostial, OM 80-90%  . CAROTID ENDARTERECTOMY  08/2010   left/ Dr. Kellie Simmering  . CATARACT EXTRACTION W/PHACO Left 07/09/2017   Procedure: CATARACT EXTRACTION PHACO AND INTRAOCULAR LENS PLACEMENT (IOC);  Surgeon: Birder Robson, MD;  Location: [facility] ORS;  Service: Ophthalmology;  Laterality: Left;  [place] 00:23 AP% 14.2 CDE 3.26 Fluid pack lot # [id number]  . CATARACT EXTRACTION W/PHACO Right 09/10/2017   Procedure: CATARACT EXTRACTION PHACO AND INTRAOCULAR LENS PLACEMENT (IOC);  Surgeon: Birder Robson, MD;  Location: [facility]  ORS;  Service: Ophthalmology;  Laterality: Right;  [place] 00:26.4 AP% 17.3 CDE 4.59 Fluid Pack Lot # [id number]  . COLONOSCOPY    . CORONARY ANGIOPLASTY     LAD: before CABG  . CORONARY ARTERY BYPASS GRAFT  09/06/2010   At [facility]: LIMA to LAD, left radial to RCA, sequential SVG to OM3 and 4  . heart stents  Jan 2011   leg stents 06/2009 and 03/2010  . LOWER EXTREMITY ANGIOGRAPHY Left 06/09/2019   Procedure: LOWER EXTREMITY ANGIOGRAPHY;  Surgeon: Katha Cabal, MD;  Location: [facility] CV LAB;  Service: Cardiovascular;  Laterality: Left;  . PTA of illiac and SFA  multiple   Dr. Ronalee Belts, s/p revision 11/2012  . RIGHT/LEFT HEART CATH AND CORONARY/GRAFT ANGIOGRAPHY N/A 02/15/2020    Procedure: RIGHT/LEFT HEART CATH AND CORONARY/GRAFT ANGIOGRAPHY;  Surgeon: Wellington Hampshire, MD;  Location: [facility] CV LAB;  Service: Cardiovascular;  Laterality: N/A;  . SPLENECTOMY    . VASCULAR SURGERY     LEG STENTS   Family History  Problem Relation Age of Onset  . Lung cancer Father 89  . Arthritis Father   . Breast cancer Sister 33  . Alcoholism Sister   . Dementia Mother   . Alzheimer's disease Mother   . Arthritis Mother   . Alcoholism Brother   . Epilepsy Sister   . Diabetes Paternal Grandfather   . Colon polyps Paternal Grandfather 62  . Alcoholism Sister   . Alcoholism Sister   . Alcoholism Brother    Social History   Tobacco Use  . Smoking status: Former Smoker    Packs/day: 0.75    Years: 40.00    Pack years: 30.00    Types: Cigarettes    Quit date: 04/15/2017    Years since quitting: 2.8  . Smokeless tobacco: Never Used  . Tobacco comment: 1 cigarette a day  Substance Use Topics  . Alcohol use: No   Allergies  Allergen Reactions  . Contrast Media [Iodinated Diagnostic Agents] Rash and Other (See Comments)    Got very hot and red   . Glipizide Other (See Comments)    ANTIDIABETICS. Burning  . Metrizamide Other (See Comments)    Got very hot and red  . Penicillins Hives and Swelling    Has patient had a PCN reaction causing immediate rash, facial/tongue/throat swelling, SOB or lightheadedness with hypotension: yes Has patient had a PCN reaction causing severe rash involving mucus membranes or skin necrosis: no  Has patient had a PCN reaction that required hospitalization: yes Has patient had a PCN reaction occurring within the last 10 years: no If all of the above answers are "NO", then may proceed with Cephalosporin use.    Prior to Admission medications   Medication Sig Start Date End Date Taking? Authorizing Provider  albuterol (PROAIR HFA) 108 (90 Base) MCG/ACT inhaler Inhale 2 puffs into the lungs every 6 (six) hours as needed for  shortness of breath. 09/14/19  Yes Viviana Simpler I, MD  amLODipine (NORVASC) 2.5 MG tablet Take 1 tablet (2.5 mg total) by mouth daily. 02/19/20  Yes Sharen Hones, MD  aspirin 81 MG tablet Take 81 mg by mouth daily.   Yes [provider]  buPROPion (WELLBUTRIN SR) 150 MG 12 hr tablet Take 1 tablet (150 mg total) by mouth daily. 02/23/20  Yes [name], MD  busPIRone (BUSPAR) 10 MG tablet TAKE 2 TABLETS(20 MG) BY MOUTH TWICE DAILY 02/19/20  Yes [name], MD  calcitRIOL (  ROCALTROL) 0.25 MCG capsule Take 0.25 mcg by mouth 3 (three) times a week. Monday then Wednesday then Friday   Yes [provider]  carvedilol (COREG) 12.5 MG tablet Take 1 tablet (12.5 mg total) by mouth 2 (two) times daily. Patient taking differently: Take 12.5 mg by mouth daily.  12/10/19  Yes Wellington Hampshire, MD  clopidogrel (PLAVIX) 75 MG tablet Take 75 mg by mouth daily.   Yes [provider]  cyclobenzaprine (FLEXERIL) 10 MG tablet Take 10 mg by mouth 3 (three) times daily as needed for muscle spasms.  12/06/19  Yes [provider]  Dextran 70-Hypromellose (ARTIFICIAL TEARS) 0.1-0.3 % SOLN Place 1 drop into both eyes 4 (four) times daily as needed (dry eyes).   Yes [provider]  finasteride (PROSCAR) 5 MG tablet Take 5 mg by mouth daily.   Yes [provider]  isosorbide mononitrate (IMDUR) 30 MG 24 hr tablet Take 3 tablets (90 mg total) by mouth daily. 02/23/20  Yes Dunn, Areta Haber, PA-C  nitroGLYCERIN (NITROSTAT) 0.4 MG SL tablet Place 1 tablet (0.4 mg total) under the tongue every 5 (five) minutes as needed. Maximum of 3 doses. 02/23/20  Yes Dunn, Areta Haber, PA-C  oxyCODONE-acetaminophen (PERCOCET) 5-325 MG tablet Take 1 tablet by mouth every 4 (four) hours as needed for severe pain. 02/08/20  Yes Harvest Dark, MD  sertraline (ZOLOFT) 100 MG tablet Take 1 tablet (100 mg total) by mouth daily. 02/23/20  Yes [name], MD  simvastatin (ZOCOR) 20 MG tablet  TAKE 1 TABLET(20 MG) BY MOUTH DAILY AT 6 PM Patient taking differently: Take 20 mg by mouth every evening.  11/20/19  Yes Wellington Hampshire, MD  tamsulosin (FLOMAX) 0.4 MG CAPS capsule Take 1 capsule by mouth daily Patient taking differently: Take 0.4 mg by mouth daily.  01/20/20  Yes [name], MD  torsemide (DEMADEX) 20 MG tablet Take 2 tablets (40 mg total) by mouth daily. 02/24/20  Yes [name], MD    Review of Systems  Constitutional: Positive for fatigue. Negative for appetite change.  HENT: Negative for congestion, postnasal drip and sore throat.   Eyes: Negative.   Respiratory: Positive for shortness of breath. Negative for cough.   Cardiovascular: Positive for chest pain. Negative for palpitations and leg swelling.  Gastrointestinal: Negative for abdominal distention and abdominal pain.  Endocrine: Negative.   Genitourinary: Negative.   Musculoskeletal: Positive for arthralgias ("all over"). Negative for back pain.  Skin: Negative.   Allergic/Immunologic: Negative.   Neurological: Positive for dizziness, light-headedness and numbness (neuropathy).  Hematological: Negative for adenopathy. Does not bruise/bleed easily.  Psychiatric/Behavioral: Negative for dysphoric mood and sleep disturbance (sleeping on 2 pillows along with CPAP). The patient is not nervous/anxious.    Vitals:   02/29/20 1037  BP: (!) 150/77  Pulse: 87  Resp: 18  SpO2: 97%  Weight: 232 lb 4 oz (105.3 kg)  Height: 5\' 6"  (1.676 m)   Wt Readings from Last 3 Encounters:  02/29/20 232 lb 4 oz (105.3 kg)  02/23/20 233 lb (105.7 kg)  02/23/20 231 lb (104.8 kg)   Lab Results  Component Value Date   CREATININE 3.08 (H) 02/23/2020   CREATININE 4.41 (H) 02/18/2020   CREATININE 4.25 (H) 02/17/2020   Physical Exam Vitals and nursing note reviewed.  Constitutional:      Appearance: [name] [name] is well-developed.  HENT:     Head: Normocephalic and atraumatic.  Neck:     Vascular: No JVD.  Cardiovascular:     Rate and Rhythm: Normal rate and regular rhythm.  Pulmonary:     Effort: Pulmonary effort is normal. No respiratory distress.     Breath sounds: Rales present. No wheezing.  Musculoskeletal:     Cervical back: Neck supple.     Right lower leg: No tenderness. Edema (trace pitting) present.     Left lower leg: No tenderness. Edema (trace pitting) present.  Skin:    General: Skin is warm and dry.  Neurological:     General: No focal deficit present.     Mental Status: [name] [name] is alert and oriented to person, place, and time.  Psychiatric:        Mood and Affect: Mood normal.        Behavior: Behavior normal.    Assessment & Plan:  1: Chronic heart failure with preserved ejection fraction with structural changes (LVH)- - NYHA class III - euvolemic today - weighing daily and [name] [name] was reminded to call for an overnight weight gain of >2 pounds or a weekly weight gain of >5 pounds - not adding salt and is trying to closely follow a low sodium diet and is reading food labels for sodium content - saw cardiology (Dunn) 02/23/20 - will defer ARB use to nephrology at this time - BNP 02/13/20 was 713.3 - [name] [name] reports receiving both COVID vaccines  2: HTN- - BP mildly elevated today but [name] [name] hasn't taken his medications yet today  - saw PCP [name]) 02/23/20 - BMP 02/23/20 reviewed and showed sodium 138, potassium 4.8, creatinine 3.08 and GFR 20  3: CKD- - saw nephrology (Kolluru) earlier today; patient doesn't report any medication changes from today's visit    Patient did not bring his medications nor a list. Each medication was verbally reviewed with the patient and [name] [name] was encouraged to bring the bottles to every visit to confirm accuracy of list.  Return in 3 months or sooner for any questions/problems before then.

## 2020-02-29 ENCOUNTER — Encounter: Payer: Self-pay | Admitting: Family

## 2020-02-29 ENCOUNTER — Other Ambulatory Visit: Payer: Self-pay

## 2020-02-29 ENCOUNTER — Ambulatory Visit: Payer: Medicare Other | Attending: Family | Admitting: Family

## 2020-02-29 VITALS — BP 150/77 | HR 87 | Resp 18 | Ht 66.0 in | Wt 232.2 lb

## 2020-02-29 DIAGNOSIS — E785 Hyperlipidemia, unspecified: Secondary | ICD-10-CM | POA: Diagnosis not present

## 2020-02-29 DIAGNOSIS — Z7982 Long term (current) use of aspirin: Secondary | ICD-10-CM | POA: Diagnosis not present

## 2020-02-29 DIAGNOSIS — K219 Gastro-esophageal reflux disease without esophagitis: Secondary | ICD-10-CM | POA: Insufficient documentation

## 2020-02-29 DIAGNOSIS — I130 Hypertensive heart and chronic kidney disease with heart failure and stage 1 through stage 4 chronic kidney disease, or unspecified chronic kidney disease: Secondary | ICD-10-CM | POA: Insufficient documentation

## 2020-02-29 DIAGNOSIS — Z888 Allergy status to other drugs, medicaments and biological substances status: Secondary | ICD-10-CM | POA: Insufficient documentation

## 2020-02-29 DIAGNOSIS — Z9841 Cataract extraction status, right eye: Secondary | ICD-10-CM | POA: Diagnosis not present

## 2020-02-29 DIAGNOSIS — Z955 Presence of coronary angioplasty implant and graft: Secondary | ICD-10-CM | POA: Diagnosis not present

## 2020-02-29 DIAGNOSIS — Z8673 Personal history of transient ischemic attack (TIA), and cerebral infarction without residual deficits: Secondary | ICD-10-CM | POA: Insufficient documentation

## 2020-02-29 DIAGNOSIS — I252 Old myocardial infarction: Secondary | ICD-10-CM | POA: Diagnosis not present

## 2020-02-29 DIAGNOSIS — Z79899 Other long term (current) drug therapy: Secondary | ICD-10-CM | POA: Insufficient documentation

## 2020-02-29 DIAGNOSIS — N189 Chronic kidney disease, unspecified: Secondary | ICD-10-CM | POA: Insufficient documentation

## 2020-02-29 DIAGNOSIS — E1151 Type 2 diabetes mellitus with diabetic peripheral angiopathy without gangrene: Secondary | ICD-10-CM | POA: Insufficient documentation

## 2020-02-29 DIAGNOSIS — Z880 Allergy status to penicillin: Secondary | ICD-10-CM | POA: Insufficient documentation

## 2020-02-29 DIAGNOSIS — Z9842 Cataract extraction status, left eye: Secondary | ICD-10-CM | POA: Insufficient documentation

## 2020-02-29 DIAGNOSIS — Z87891 Personal history of nicotine dependence: Secondary | ICD-10-CM | POA: Diagnosis not present

## 2020-02-29 DIAGNOSIS — Z86718 Personal history of other venous thrombosis and embolism: Secondary | ICD-10-CM | POA: Diagnosis not present

## 2020-02-29 DIAGNOSIS — I350 Nonrheumatic aortic (valve) stenosis: Secondary | ICD-10-CM | POA: Diagnosis not present

## 2020-02-29 DIAGNOSIS — I10 Essential (primary) hypertension: Secondary | ICD-10-CM

## 2020-02-29 DIAGNOSIS — Z9081 Acquired absence of spleen: Secondary | ICD-10-CM | POA: Insufficient documentation

## 2020-02-29 DIAGNOSIS — M1990 Unspecified osteoarthritis, unspecified site: Secondary | ICD-10-CM | POA: Insufficient documentation

## 2020-02-29 DIAGNOSIS — E1140 Type 2 diabetes mellitus with diabetic neuropathy, unspecified: Secondary | ICD-10-CM | POA: Insufficient documentation

## 2020-02-29 DIAGNOSIS — Z7902 Long term (current) use of antithrombotics/antiplatelets: Secondary | ICD-10-CM | POA: Insufficient documentation

## 2020-02-29 DIAGNOSIS — Z951 Presence of aortocoronary bypass graft: Secondary | ICD-10-CM | POA: Insufficient documentation

## 2020-02-29 DIAGNOSIS — N184 Chronic kidney disease, stage 4 (severe): Secondary | ICD-10-CM

## 2020-02-29 DIAGNOSIS — F419 Anxiety disorder, unspecified: Secondary | ICD-10-CM | POA: Insufficient documentation

## 2020-02-29 DIAGNOSIS — I25119 Atherosclerotic heart disease of native coronary artery with unspecified angina pectoris: Secondary | ICD-10-CM | POA: Insufficient documentation

## 2020-02-29 DIAGNOSIS — F329 Major depressive disorder, single episode, unspecified: Secondary | ICD-10-CM | POA: Diagnosis not present

## 2020-02-29 DIAGNOSIS — Z833 Family history of diabetes mellitus: Secondary | ICD-10-CM | POA: Insufficient documentation

## 2020-02-29 DIAGNOSIS — E1122 Type 2 diabetes mellitus with diabetic chronic kidney disease: Secondary | ICD-10-CM | POA: Diagnosis not present

## 2020-02-29 DIAGNOSIS — I5032 Chronic diastolic (congestive) heart failure: Secondary | ICD-10-CM | POA: Insufficient documentation

## 2020-02-29 NOTE — Patient Instructions (Signed)
Continue weighing daily and call for an overnight weight gain of > 2 pounds or a weekly weight gain of >5 pounds. 

## 2020-03-01 ENCOUNTER — Telehealth: Payer: Self-pay

## 2020-03-01 ENCOUNTER — Ambulatory Visit: Payer: Medicare Other | Admitting: Physician Assistant

## 2020-03-01 DIAGNOSIS — I2510 Atherosclerotic heart disease of native coronary artery without angina pectoris: Secondary | ICD-10-CM | POA: Diagnosis not present

## 2020-03-01 DIAGNOSIS — I5033 Acute on chronic diastolic (congestive) heart failure: Secondary | ICD-10-CM | POA: Diagnosis not present

## 2020-03-01 DIAGNOSIS — E1151 Type 2 diabetes mellitus with diabetic peripheral angiopathy without gangrene: Secondary | ICD-10-CM | POA: Diagnosis not present

## 2020-03-01 DIAGNOSIS — R21 Rash and other nonspecific skin eruption: Secondary | ICD-10-CM | POA: Diagnosis not present

## 2020-03-01 DIAGNOSIS — I10 Essential (primary) hypertension: Secondary | ICD-10-CM | POA: Diagnosis not present

## 2020-03-01 DIAGNOSIS — I214 Non-ST elevation (NSTEMI) myocardial infarction: Secondary | ICD-10-CM | POA: Diagnosis not present

## 2020-03-01 NOTE — Telephone Encounter (Signed)
Merry Proud PT with [facility] requesting verbal orders for [facility] PT for remaining certification; 1 x a wk for 1 wk and 2 x a wk for 2 wks for endurance.Please advise.

## 2020-03-02 ENCOUNTER — Telehealth: Payer: Self-pay

## 2020-03-02 NOTE — Telephone Encounter (Signed)
Verbal orders left on verified vm

## 2020-03-02 NOTE — Telephone Encounter (Signed)
Telephone call to schedule palliative care visit.  Patient in agreement with RN makin ghome visit 03/03/20 at 1:00 PM.

## 2020-03-02 NOTE — Telephone Encounter (Signed)
That is okay.

## 2020-03-02 NOTE — Telephone Encounter (Signed)
Telephone call to schedule palliative care visit.  RN left message for patients friend Shanon Brow to schedule palliative care visit.

## 2020-03-03 ENCOUNTER — Other Ambulatory Visit: Payer: Self-pay

## 2020-03-03 ENCOUNTER — Other Ambulatory Visit: Payer: Medicare Other

## 2020-03-03 VITALS — Ht 66.0 in | Wt 225.0 lb

## 2020-03-03 DIAGNOSIS — Z515 Encounter for palliative care: Secondary | ICD-10-CM

## 2020-03-03 NOTE — Progress Notes (Signed)
PATIENT NAME: [name] [name] DOB: Apr 06, 1956 MRN: [id number]  PRIMARY CARE PROVIDER: [name], MD  RESPONSIBLE PARTY:  Acct ID - Guarantor Home Phone Work Phone Relationship Acct Type  [id number] Jarome [phone]  Self P/F     [address], [place], [place] [address]    PLAN OF CARE and INTERVENTIONS:               1.  GOALS OF CARE/ ADVANCE CARE PLANNING:  Patient states he wants to get better, get out of the house more and be able to ambulate to the mailbox.                2.  PATIENT/CAREGIVER EDUCATION: Education on palliative care services, education on fall precautions, education on s/s of infection, reviewed meds, support                 3.  DISEASE STATUS: RN made scheduled palliative care home visit. Nurse met with patient and his Gearldine Shown was in home with patient. Patient alert and oriented. Patient past medical history includes but not limited to PVD, CAD, hypertension, carotid artery disease, type 2 diabetes, aortic valve stenosis, Atherosclerotic heart disease, Atherosclerosis of aorta, thoracic aortic aneurysm, CHF, NSTEMI, OSA, COPD, CKD stage 4 secondary to hyperparathyroidism of renal origin, polyneuropathy, bilateral leg weakness, obesity, major depressive disorder and hyperlipidemia. Patient is 5'6 and reports his weight this AM was 225 lbs. Patient reports he suffered a heart attack three weeks ago and was hospitalized. Patient reports he needs sent placement but due to stage of kidney disease he is waiting on MD's to confer to see if he can have stent placed.  Patient frustrated and feels he is "in limbo" not knowing what the outcome will be. Support provided to patient. Patient lost his wife a year ago in June to pancreatic cancer. Patients step son and his wife living in home with patient.  Family is very supportive. Patient reports his appetite is adequate. Patient reports his blood sugar this AM was 162. Patient does weight self daily and family monitors  patient's sodium intake in frozen meals patient eats. Patient denies pain at the present time but reports he had pressure in his chest this morning and took 2 tablets of nitroglycerin. Patient also reports he has intermittent pain from his hips down to his feet. Patient has shortness of breath with minimal exertion. Patient has a non-productive cough. Patient denies suffering any recent falls. Patient reports he does bump into stuff and bruises easily. Patient has bruising to right side that he acquired while being in the hospital.  Bruising is slowly fading per patient. Patient has 2+ edema in his lower extremities and patient reports he attempts to keep lower extremities elevated. Patient reports he has not been sleeping well at night. Patient reports he does wear CPAP at night. Patients vital signs are stable. Patient states he has difficulty getting in and out of the car. Patients goal is to be able to get better, get it out of the house more and be able to walk to the mailbox. Patient has a living will and [place]. Family wants to discuss DNR with Education officer, museum. Patient and family in agreement with palliative care services. Patient and Shanon Brow encouraged to contact palliative care with questions or concerns.    HISTORY OF PRESENT ILLNESS:    CODE STATUS: No Code Will request SW obtain DNR  ADVANCED DIRECTIVES: Y MOST FORM: No PPS: 50%  PHYSICAL EXAM:   VITALS: Today's Vitals   03/03/20 1317  Weight: 225 lb (102.1 kg)  Height: _0  (1.676 m)  PainSc: 0-No pain    LUNGS: decreased breath sounds CARDIAC: Cor RRR  EXTREMITIES: 2+ edema SKIN: bruising on right side,  Bruising sent hospitalization NEURO: positive for gait problems and weakness       Nilda Simmer, RN

## 2020-03-07 DIAGNOSIS — I214 Non-ST elevation (NSTEMI) myocardial infarction: Secondary | ICD-10-CM | POA: Diagnosis not present

## 2020-03-07 DIAGNOSIS — R21 Rash and other nonspecific skin eruption: Secondary | ICD-10-CM | POA: Diagnosis not present

## 2020-03-07 DIAGNOSIS — I2510 Atherosclerotic heart disease of native coronary artery without angina pectoris: Secondary | ICD-10-CM | POA: Diagnosis not present

## 2020-03-07 DIAGNOSIS — I10 Essential (primary) hypertension: Secondary | ICD-10-CM | POA: Diagnosis not present

## 2020-03-07 DIAGNOSIS — E1151 Type 2 diabetes mellitus with diabetic peripheral angiopathy without gangrene: Secondary | ICD-10-CM | POA: Diagnosis not present

## 2020-03-07 DIAGNOSIS — I5033 Acute on chronic diastolic (congestive) heart failure: Secondary | ICD-10-CM | POA: Diagnosis not present

## 2020-03-07 NOTE — Progress Notes (Signed)
Cardiology Office Note    Date:  03/11/2020   ID:  Demichael, Traum 1956/02/27, MRN [id number]  PCP:  [name], MD  Cardiologist:  [name], MD  Electrophysiologist:  None   Chief Complaint: Follow up  History of Present Illness:   [name] [name] is a 64 y.o. male with history of CAD status post CABG in 2012, moderate aortic stenosis, carotid artery disease status post left-sided CEA, DM2, CKD stage IV, anemia of chronic disease, HTN, HLD, PAD status post extensive lower extremity revascularization by vascular surgery in 05/2019, tobacco use, obesity, and psoriasiswho presents for follow up of his CAD.   [name] recently underwent ischemic evaluation via stress testing in 2013 which showed no evidence of ischemia. Echo in 12/2018 showed normal LV systolic function with moderate aortic stenosis with an aortic valve area 1.28 cm and a mean gradient of 20 mmHg and a mildly dilated aortic root at 4 cm. He was most recently seen by Dr. Fletcher Anon in 10/2019 and was doing well from a cardiac perspective. He continued to cope with the passing of his wife from pancreatic cancer. His renal function continued to deteriorate and is followed by nephrology. He underwent echo and 12/2019 which demonstrated low normal EF of 50 to 55%, no regional wall motion abnormalities, mild LVH, grade 2 diastolic dysfunction, normal RV systolic function and RV cavity size, mildly dilated left atrium, mild to moderate mitral regurgitation, moderate aortic stenosis with a valve area of 1.06 cm, and a mean gradient of 26 mm.  He was seen in the ED on 02/08/2020 with right-sided flank pain that was worse with movement. CT renal stone protocol showed no acute intra-abdominal findings. Laboratory evaluation was unrevealing.  Following this, he was admitted to [facility] on 7/24 with a NSTEMI with HS-Tn peaking at 470.  Plan was to have the patient undergo R/LHC, though this ended up being aborted given  respiratory status during the procedure with plans for [facility] following diuresis with Lasix gtt.  He was insistent on going home.  Cath was ultimately deferred given declining renal function with a discharge SCr of 4.41.  He was seen in hospital follow up on 8/3, and was doing well.  He had one episode of chest pain at rest the evening prior to his visit on 8/3 that was similar to his prior episode and lasted for 10 minutes with spontaneous resolution.  After discussion with his primary cardiologist, optimization of medical therapy was advised given his underlying renal dysfunction and his Imdur was titrated to 90 mg.  His weight was down 4 pounds when compared to his prior clinic visit.   He comes in today continuing to note exertional angina and fatigue.  He is having to take 2-3 sublingual nitro approximately every other day due to chest discomfort.  Chest pain feels like "an elephant sitting on my chest."  Symptoms feel exactly the same as what he was feeling leading up to his recent hospitalization as outlined above.  At its worst, pain is a 10 out of 10.  He last had this chest discomfort beginning around midday on 8/19 which persisted into this morning with a current 5 out of 10 chest pain.  He has not noted any significant improvement in his symptoms with escalation of Imdur.  Historically, escalation of amlodipine or carvedilol has been precluded secondary to orthostasis.  He is ready to proceed with diagnostic R/LHC given progression of symptoms despite potential risk for needed hemodialysis  thereafter.   Labs independently reviewed: 01/2020 - magnesium 1.8, potassium 4.2, BUN 88, SCr 4.41, HGB 10.4, PLT 184, TC 122, TG 163, HDL 33, LDL 56, A1c 5.7   Past Medical History:  Diagnosis Date  . Adenomatous colon polyp   . Allergy   . Angina at rest [facility])    chronic  . Anxiety   . Arthritis    RA  . Bell's palsy 2007  . Carotid artery occlusion   . Cataract    Dr. Dawna Part  . CHF  (congestive heart failure) ([place])   . Coronary artery disease   . Depression   . Diabetes mellitus   . Diverticulosis   . Duodenitis   . DVT (deep venous thrombosis) ([facility])    in leg  . Dyspnea    DOE  . Gastropathy   . GERD (gastroesophageal reflux disease)   . Heart murmur   . Helicobacter pylori gastritis   . Hyperlipidemia   . Hypertension   . Kidney failure   . Mild aortic stenosis   . Neuropathy   . Obesity   . Peripheral vascular disease ([place])   . Post splenectomy syndrome   . Psoriasis   . Psoriatic arthritis ([place])   . Renal disorder    stage 4 kidney failure  . Sleep apnea    uses cpap  . SOB (shortness of breath)   . Stroke ([place])   . Tobacco use disorder    recently quit  . Tubular adenoma 08/2013   Dr. Hilarie Fredrickson  . Weak urinary stream     Past Surgical History:  Procedure Laterality Date  . CARDIAC CATHETERIZATION  08/2010   LAD: 80% ISR, RCA: 80% ostial, OM 80-90%  . CAROTID ENDARTERECTOMY  08/2010   left/ Dr. Kellie Simmering  . CATARACT EXTRACTION W/PHACO Left 07/09/2017   Procedure: CATARACT EXTRACTION PHACO AND INTRAOCULAR LENS PLACEMENT (IOC);  Surgeon: Birder Robson, MD;  Location: [facility] ORS;  Service: Ophthalmology;  Laterality: Left;  [place] 00:23 AP% 14.2 CDE 3.26 Fluid pack lot # [id number]  . CATARACT EXTRACTION W/PHACO Right 09/10/2017   Procedure: CATARACT EXTRACTION PHACO AND INTRAOCULAR LENS PLACEMENT (IOC);  Surgeon: Birder Robson, MD;  Location: [facility] ORS;  Service: Ophthalmology;  Laterality: Right;  [place] 00:26.4 AP% 17.3 CDE 4.59 Fluid Pack Lot # [id number]  . COLONOSCOPY    . CORONARY ANGIOPLASTY     LAD: before CABG  . CORONARY ARTERY BYPASS GRAFT  09/06/2010   At [facility]: LIMA to LAD, left radial to RCA, sequential SVG to OM3 and 4  . heart stents  Jan 2011   leg stents 06/2009 and 03/2010  . LOWER EXTREMITY ANGIOGRAPHY Left 06/09/2019   Procedure: LOWER EXTREMITY ANGIOGRAPHY;  Surgeon: Katha Cabal, MD;  Location: [place] [facility];   Service: Cardiovascular;  Laterality: Left;  . PTA of illiac and SFA  multiple   Dr. Ronalee Belts, s/p revision 11/2012  . RIGHT/LEFT HEART CATH AND CORONARY/GRAFT ANGIOGRAPHY N/A 02/15/2020   Procedure: RIGHT/LEFT HEART CATH AND CORONARY/GRAFT ANGIOGRAPHY;  Surgeon: Wellington Hampshire, MD;  Location: [place] [facility];  Service: Cardiovascular;  Laterality: N/A;  . SPLENECTOMY    . VASCULAR SURGERY     LEG STENTS    Current Medications: Current Meds  Medication Sig  . albuterol (PROAIR HFA) 108 (90 Base) MCG/ACT inhaler Inhale 2 puffs into the lungs every 6 (six) hours as needed for shortness of breath.  [name] amLODipine (NORVASC) 2.5 MG tablet Take 1 tablet (2.5 mg total) by  mouth daily.  [name] aspirin 81 MG tablet Take 81 mg by mouth daily.  [name] buPROPion (WELLBUTRIN SR) 150 MG 12 hr tablet Take 1 tablet (150 mg total) by mouth daily.  . busPIRone (BUSPAR) 10 MG tablet TAKE 2 TABLETS(20 MG) BY MOUTH TWICE DAILY  . calcitRIOL (ROCALTROL) 0.25 MCG capsule Take 0.25 mcg by mouth 3 (three) times a week. Monday then Wednesday then Friday  . carvedilol (COREG) 12.5 MG tablet Take 1 tablet (12.5 mg total) by mouth 2 (two) times daily. (Patient taking differently: Take 12.5 mg by mouth daily. )  . clopidogrel (PLAVIX) 75 MG tablet Take 75 mg by mouth daily.  . cyclobenzaprine (FLEXERIL) 10 MG tablet Take 10 mg by mouth 3 (three) times daily as needed for muscle spasms.   . Dextran 70-Hypromellose (ARTIFICIAL TEARS) 0.1-0.3 % SOLN Place 1 drop into both eyes 4 (four) times daily as needed (dry eyes).  . finasteride (PROSCAR) 5 MG tablet Take 5 mg by mouth daily.  . isosorbide mononitrate (IMDUR) 30 MG 24 hr tablet Take 3 tablets (90 mg total) by mouth daily.  . nitroGLYCERIN (NITROSTAT) 0.4 MG SL tablet Place 1 tablet (0.4 mg total) under the tongue every 5 (five) minutes as needed. Maximum of 3 doses.  [name] oxyCODONE-acetaminophen (PERCOCET) 5-325 MG tablet Take 1 tablet by mouth every 4 (four) hours as needed  for severe pain.  [name] sertraline (ZOLOFT) 100 MG tablet Take 1 tablet (100 mg total) by mouth daily.  . simvastatin (ZOCOR) 20 MG tablet TAKE 1 TABLET(20 MG) BY MOUTH DAILY AT 6 PM (Patient taking differently: Take 20 mg by mouth every evening. )  . tamsulosin (FLOMAX) 0.4 MG CAPS capsule Take 1 capsule by mouth daily (Patient taking differently: Take 0.4 mg by mouth daily. )  . torsemide (DEMADEX) 20 MG tablet Take 2 tablets (40 mg total) by mouth daily.    Allergies:   Contrast media [iodinated diagnostic agents], Glipizide, Metrizamide, and Penicillins   Social History   Socioeconomic History  . Marital status: Widowed    Spouse name: Not on file  . Number of children: 2  . Years of education: Not on file  . Highest education level: Not on file  Occupational History  . Occupation: Chief Operating Officer at [employer]: Disabled mostly due to neuropathy  Tobacco Use  . Smoking status: Former Smoker    Packs/day: 0.75    Years: 40.00    Pack years: 30.00    Types: Cigarettes    Quit date: 04/15/2017    Years since quitting: 2.9  . Smokeless tobacco: Never Used  . Tobacco comment: 1 cigarette a day  Vaping Use  . Vaping Use: Never used  Substance and Sexual Activity  . Alcohol use: No  . Drug use: No  . Sexual activity: Never  Other Topics Concern  . Not on file  Social History Narrative   Wife died 01/21/19      Has living will   Gearldine Shown and his wife Levada Dy should be his health care POA   Would accept resuscitation --but no prolonged ventilation   No tube feeds if cognitively unaware   Social Determinants of Health   Financial Resource Strain: Low Risk   . Difficulty of Paying Living Expenses: Not very hard  Food Insecurity: No Food Insecurity  . Worried About Charity fundraiser in the Last Year: Never true  . Ran Out of Food in the Last Year: Never true  Transportation Needs: No Transportation Needs  . Lack of Transportation (Medical): No  . Lack of  Transportation (Non-Medical): No  Physical Activity:   . Days of Exercise per Week: Not on file  . Minutes of Exercise per Session: Not on file  Stress: No Stress Concern Present  . Feeling of Stress : Only a little  Social Connections: Unknown  . Frequency of Communication with Friends and Family: More than three times a week  . Frequency of Social Gatherings with Friends and Family: Not on file  . Attends Religious Services: Not on file  . Active Member of Clubs or Organizations: Not on file  . Attends Archivist Meetings: Not on file  . Marital Status: Not on file     Family History:  The patient's family history includes Alcoholism in his brother, brother, sister, sister, and sister; Alzheimer's disease in his mother; Arthritis in his father and mother; Breast cancer (age of onset: 67) in his sister; Colon polyps (age of onset: 72) in his paternal grandfather; Dementia in his mother; Diabetes in his paternal grandfather; Epilepsy in his sister; Lung cancer (age of onset: 75) in his father.  ROS:   Review of Systems  Constitutional: Positive for malaise/fatigue. Negative for chills, diaphoresis, fever and weight loss.  HENT: Negative for congestion.   Eyes: Negative for discharge and redness.  Respiratory: Positive for shortness of breath. Negative for cough, sputum production and wheezing.   Cardiovascular: Positive for chest pain. Negative for palpitations, orthopnea, claudication, leg swelling and PND.  Gastrointestinal: Negative for abdominal pain, heartburn, melena, nausea and vomiting.  Musculoskeletal: Negative for falls and myalgias.  Skin: Negative for rash.  Neurological: Positive for weakness. Negative for dizziness, tingling, tremors, sensory change, speech change, focal weakness and loss of consciousness.  Endo/Heme/Allergies: Does not bruise/bleed easily.  Psychiatric/Behavioral: Negative for substance abuse. The patient is not nervous/anxious.   All other  systems reviewed and are negative.    EKGs/Labs/Other Studies Reviewed:    Studies reviewed were summarized above. The additional studies were reviewed today:  2D echo 01/11/2020: 1. Left ventricular ejection fraction, by estimation, is 50 to 55%. The  left ventricle has low normal function. The left ventricle has no regional  wall motion abnormalities. There is mild left ventricular hypertrophy.  Left ventricular diastolic  parameters are consistent with Grade II diastolic dysfunction  (pseudonormalization).  2. Right ventricular systolic function is normal. The right ventricular  size is normal. There is moderately elevated pulmonary artery systolic  pressure.  3. Left atrial size was mildly dilated.  4. The mitral valve is abnormal. Mild to moderate mitral valve  regurgitation. No evidence of mitral stenosis.  5. The aortic valve is abnormal. Aortic valve regurgitation is not  visualized. Moderate aortic valve stenosis. Aortic valve area, by VTI  measures 1.06 cm. Aortic valve mean gradient measures 26.0 mmHg.  6. Aortic dilatation noted. There is mild dilatation of the ascending  aorta measuring 41 mm.  7. The inferior vena cava is normal in size with <50% respiratory  variability, suggesting right atrial pressure of 8 mmHg.   Comparison(s): Previous Echo showed normal LV systolic function with  moderate aortic stenosis and mildly dilated aortic root at 4 cm. Aortic  valve area was 1.28 cm.    EKG:  EKG is ordered today.  The EKG ordered today demonstrates NSR, 77 bpm, first-degree AV block, left axis deviation, poor R wave progression along the precordial leads, nonspecific inferior lateral ST-T changes, largely unchanged  when compared to prior  Recent Labs: 02/13/2020: ALT 15; B Natriuretic Peptide 713.3 02/18/2020: Magnesium 1.8 02/23/2020: BUN 53; Creatinine, Ser 3.08; Hemoglobin 10.1; Platelets 173; Potassium 4.8; Sodium 138  Recent Lipid Panel    Component Value  Date/Time   CHOL 122 02/13/2020 0525   CHOL 116 11/18/2017 1002   TRIG 163 (H) 02/13/2020 0525   HDL 33 (L) 02/13/2020 0525   HDL 30 (L) 11/18/2017 1002   CHOLHDL 3.7 02/13/2020 0525   VLDL 33 02/13/2020 0525   LDLCALC 56 02/13/2020 0525   LDLCALC 51 11/18/2017 1002   LDLDIRECT 61.0 03/15/2017 1021    PHYSICAL EXAM:    VS:  BP 136/82 (BP Location: Left Arm, Patient Position: Sitting, Cuff Size: Normal)   Pulse 77   Ht 5\' 6"  (1.676 m)   Wt 229 lb 12.8 oz (104.2 kg)   SpO2 95%   BMI 37.09 kg/m   BMI: Body mass index is 37.09 kg/m.  Physical Exam Constitutional:      Appearance: He is well-developed.  HENT:     Head: Normocephalic and atraumatic.  Eyes:     General:        Right eye: No discharge.        Left eye: No discharge.  Neck:     Vascular: No JVD.  Cardiovascular:     Rate and Rhythm: Normal rate and regular rhythm.     Pulses: No midsystolic click and no opening snap.          Posterior tibial pulses are 1+ on the right side and 1+ on the left side.     Heart sounds: S1 normal and S2 normal. Heart sounds not distant. Murmur heard.  Harsh midsystolic murmur is present with a grade of 3/6 at the upper right sternal border radiating to the neck.  No friction rub.  Pulmonary:     Effort: Pulmonary effort is normal. No respiratory distress.     Breath sounds: Examination of the right-lower field reveals decreased breath sounds. Examination of the left-lower field reveals decreased breath sounds. Decreased breath sounds present. No wheezing or rales.  Chest:     Chest wall: No tenderness.  Abdominal:     General: There is no distension.     Palpations: Abdomen is soft.     Tenderness: There is no abdominal tenderness.  Musculoskeletal:     Cervical back: Normal range of motion.  Skin:    General: Skin is warm and dry.     Nails: There is no clubbing.  Neurological:     Mental Status: He is alert and oriented to person, place, and time.  Psychiatric:         Speech: Speech normal.        Behavior: Behavior normal.        Thought Content: Thought content normal.        Judgment: Judgment normal.     Wt Readings from Last 3 Encounters:  03/11/20 229 lb 12.8 oz (104.2 kg)  03/03/20 225 lb (102.1 kg)  02/29/20 232 lb 4 oz (105.3 kg)     ASSESSMENT & PLAN:   1. CAD status post CABG with recent NSTEMI with accelerating angina: Since he was last seen he has continued to have exertional angina and is taking sublingual nitroglycerin 2-3 tabs at a time every couple of days.  He has gone through his bottle of sublingual nitroglycerin that was prescribed to him at time of his last hospital discharge already.  He has not noted any improvement in symptoms with escalation of Imdur.  Historically, orthostasis has precluded escalation of amlodipine or carvedilol.  He continues to note 5 out of 10 chest pain this morning.  Sent to the medical mall for stat high-sensitivity troponin.  Low threshold to send to the ED thereafter pending these results.  If his troponin is normal we will plan to proceed with diagnostic R/LHC on 8/25 at [facility] Dr. Fletcher Anon.  If his troponin is elevated he will need to be transferred to the ED for further evaluation and likely admission with potential R/LHC, though ideally we would like to perform this at [facility] given complexity of his case.  For now, continue current medical therapy including aspirin, Plavix, amlodipine, carvedilol, Imdur, and sublingual nitroglycerin.  He understands proceeding with cardiac cath has a significant possibility of permanently damaging his kidneys to the point where he would require dialysis indefinitely and accepts this.  2. HFpEF/pulmonary hypertension: Volume status is somewhat difficult to gauge secondary to body habitus though he appears euvolemic and well compensated.  His weight is down 2 pounds today when compared to his last clinic visit.  His weight is down 4 pounds today when compared to his last  office visit.  Continue carvedilol and torsemide.  3. Aortic stenosis: Moderate by most recent echo.  We will plan for [facility] as outlined above with timing and location to be determined based on high-sensitivity troponin obtained in the medical mall today.  4. CKD stage IV: Stable.  No longer on losartan.  Followed by nephrology, who has referred him to vascular for hemodialysis access.  5. HTN: Blood pressure is reasonably controlled in the office today.  Continue current medical therapy.  6. HLD: LDL of 56.  He remains on simvastatin.  7. Carotid artery disease: Status post left-sided CEA.  Continue current medical therapy as outlined above.  8. Anemia of chronic disease: Stable.  Disposition: F/u with Dr. Fletcher Anon or an APP in 2 to 3 weeks pending the above work-up.   Medication Adjustments/Labs and Tests Ordered: Current medicines are reviewed at length with the patient today.  Concerns regarding medicines are outlined above. Medication changes, Labs and Tests ordered today are summarized above and listed in the Patient Instructions accessible in Encounters.   Signed, Christell Faith, PA-C 03/11/2020 10:19 AM     [place] [address] [place] [address] [place], [place] [address] [phone]

## 2020-03-08 DIAGNOSIS — I2510 Atherosclerotic heart disease of native coronary artery without angina pectoris: Secondary | ICD-10-CM | POA: Diagnosis not present

## 2020-03-08 DIAGNOSIS — I10 Essential (primary) hypertension: Secondary | ICD-10-CM | POA: Diagnosis not present

## 2020-03-08 DIAGNOSIS — R21 Rash and other nonspecific skin eruption: Secondary | ICD-10-CM | POA: Diagnosis not present

## 2020-03-08 DIAGNOSIS — I214 Non-ST elevation (NSTEMI) myocardial infarction: Secondary | ICD-10-CM | POA: Diagnosis not present

## 2020-03-08 DIAGNOSIS — I5033 Acute on chronic diastolic (congestive) heart failure: Secondary | ICD-10-CM | POA: Diagnosis not present

## 2020-03-08 DIAGNOSIS — E1151 Type 2 diabetes mellitus with diabetic peripheral angiopathy without gangrene: Secondary | ICD-10-CM | POA: Diagnosis not present

## 2020-03-09 DIAGNOSIS — I2510 Atherosclerotic heart disease of native coronary artery without angina pectoris: Secondary | ICD-10-CM | POA: Diagnosis not present

## 2020-03-09 DIAGNOSIS — I10 Essential (primary) hypertension: Secondary | ICD-10-CM | POA: Diagnosis not present

## 2020-03-09 DIAGNOSIS — E1151 Type 2 diabetes mellitus with diabetic peripheral angiopathy without gangrene: Secondary | ICD-10-CM | POA: Diagnosis not present

## 2020-03-09 DIAGNOSIS — I214 Non-ST elevation (NSTEMI) myocardial infarction: Secondary | ICD-10-CM | POA: Diagnosis not present

## 2020-03-09 DIAGNOSIS — R21 Rash and other nonspecific skin eruption: Secondary | ICD-10-CM | POA: Diagnosis not present

## 2020-03-09 DIAGNOSIS — I5033 Acute on chronic diastolic (congestive) heart failure: Secondary | ICD-10-CM | POA: Diagnosis not present

## 2020-03-10 DIAGNOSIS — N184 Chronic kidney disease, stage 4 (severe): Secondary | ICD-10-CM | POA: Diagnosis not present

## 2020-03-10 DIAGNOSIS — E1122 Type 2 diabetes mellitus with diabetic chronic kidney disease: Secondary | ICD-10-CM | POA: Diagnosis not present

## 2020-03-10 DIAGNOSIS — N2581 Secondary hyperparathyroidism of renal origin: Secondary | ICD-10-CM | POA: Diagnosis not present

## 2020-03-10 DIAGNOSIS — R809 Proteinuria, unspecified: Secondary | ICD-10-CM | POA: Diagnosis not present

## 2020-03-10 DIAGNOSIS — I129 Hypertensive chronic kidney disease with stage 1 through stage 4 chronic kidney disease, or unspecified chronic kidney disease: Secondary | ICD-10-CM | POA: Diagnosis not present

## 2020-03-11 ENCOUNTER — Encounter: Payer: Self-pay | Admitting: Physician Assistant

## 2020-03-11 ENCOUNTER — Telehealth: Payer: Self-pay

## 2020-03-11 ENCOUNTER — Other Ambulatory Visit
Admission: RE | Admit: 2020-03-11 | Discharge: 2020-03-11 | Disposition: A | Discharge status: Home / Self Care | Payer: Medicare Other | Attending: Physician Assistant | Admitting: Physician Assistant

## 2020-03-11 ENCOUNTER — Other Ambulatory Visit: Payer: Self-pay

## 2020-03-11 ENCOUNTER — Ambulatory Visit (INDEPENDENT_AMBULATORY_CARE_PROVIDER_SITE_OTHER): Payer: Medicare Other | Admitting: Physician Assistant

## 2020-03-11 VITALS — BP 136/82 | HR 77 | Ht 66.0 in | Wt 229.8 lb

## 2020-03-11 DIAGNOSIS — R079 Chest pain, unspecified: Secondary | ICD-10-CM | POA: Insufficient documentation

## 2020-03-11 DIAGNOSIS — I350 Nonrheumatic aortic (valve) stenosis: Secondary | ICD-10-CM

## 2020-03-11 DIAGNOSIS — E785 Hyperlipidemia, unspecified: Secondary | ICD-10-CM | POA: Diagnosis not present

## 2020-03-11 DIAGNOSIS — I5032 Chronic diastolic (congestive) heart failure: Secondary | ICD-10-CM | POA: Diagnosis not present

## 2020-03-11 DIAGNOSIS — I200 Unstable angina: Secondary | ICD-10-CM | POA: Diagnosis not present

## 2020-03-11 DIAGNOSIS — I214 Non-ST elevation (NSTEMI) myocardial infarction: Secondary | ICD-10-CM

## 2020-03-11 DIAGNOSIS — I272 Pulmonary hypertension, unspecified: Secondary | ICD-10-CM | POA: Diagnosis not present

## 2020-03-11 DIAGNOSIS — I25118 Atherosclerotic heart disease of native coronary artery with other forms of angina pectoris: Secondary | ICD-10-CM

## 2020-03-11 LAB — TROPONIN I (HIGH SENSITIVITY): Troponin I (High Sensitivity): 15 ng/L (ref <18)

## 2020-03-11 MED ORDER — PREDNISONE 50 MG PO TABS
ORAL_TABLET | ORAL | 0 refills | Status: DC
Start: 2020-03-11 — End: 2020-03-25

## 2020-03-11 NOTE — Telephone Encounter (Signed)
-----   Message from [name], [place] sent at 03/11/2020 12:22 PM EDT ----- Spoke with patient's stepson at his request.  Please schedule patient for [facility] with Dr. Fletcher Anon at [facility] on 03/16/2020.  Diagnosis CAD with recent NSTEMI and accelerating angina and moderate aortic stenosis.

## 2020-03-11 NOTE — Patient Instructions (Signed)
Medication Instructions:  Your physician recommends that you continue on your current medications as directed. Please refer to the Current Medication list given to you today.  *If you need a refill on your cardiac medications before your next appointment, please call your pharmacy*   Lab Work: 1- Your physician recommends that you return for lab work TODAY at the medical mall. No appt is needed. Hours are M-F 7AM- 6 PM.  If you have labs (blood work) drawn today and your tests are completely normal, you will receive your results only by: [name] MyChart Message (if you have MyChart) OR . A paper copy in the mail If you have any lab test that is abnormal or we need to change your treatment, we will call you to review the results.   Testing/Procedures: None ordered  Follow-Up: At [facility], you and your health needs are our priority.  As part of our continuing mission to provide you with exceptional heart care, we have created designated Provider Care Teams.  These Care Teams include your primary Cardiologist (physician) and Advanced Practice Providers (APPs -  Physician Assistants and Nurse Practitioners) who all work together to provide you with the care you need, when you need it.  We recommend signing up for the patient portal called "MyChart".  Sign up information is provided on this After Visit Summary.  MyChart is used to connect with patients for Virtual Visits (Telemedicine).  Patients are able to view lab/test results, encounter notes, upcoming appointments, etc.  Non-urgent messages can be sent to your provider as well.   To learn more about what you can do with MyChart, go to [email or web address].    Your next appointment:   2 week(s)  The format for your next appointment:   In Person  Provider:    You may see [name], MD or Christell Faith, PA-C

## 2020-03-11 NOTE — Telephone Encounter (Signed)
  Call to patient to make him aware of results and POC.   Pt verbalized understanding and agreeable to POC.   Cath instructions given verbally and printed and mailed as well.   No further questions at this time.    [address], [place] [address] Dept: [phone] Loc: [phone]  PATON CRUM  03/11/2020  You are scheduled for a Cardiac Catheterization on Wednesday, August 25 with Dr. [name].  1. Please arrive at the [facility] (Main Entrance A) at [facility]: [address] [place], [place] [address] at 10:30 AM (This time is two hours before your procedure to ensure your preparation). Free valet parking service is available.   Special note: Every effort is made to have your procedure done on time. Please understand that emergencies sometimes delay scheduled procedures.  2. Diet: Do not eat solid foods after midnight.  The patient may have clear liquids until 5am upon the day of the procedure.  3. Labs:   CV19 Pre admit testing DRIVE THRU  Please report to the PAT testing site (medical arts building) on _____august 23 Monday____ date ______8-12 AM_____ time for your DRIVE THRU covid testing that is required prior to your procedure.  Following covid testing, please remain in quarantine. If you must be around others, please wash hands, avoid touching face and wear your mask.    4. Medication instructions in preparation for your procedure: Hold Torsemide the morning of procedure.   Contrast Allergy: Yes, Please take Prednisone 50mg  by mouth at: Thirteen hours prior to cath 11:00pm on Tuesday Seven hours prior to cath 5:00am on Wednesday And prior to leaving home please take last dose of Prednisone 50mg  and Benadryl 50mg  by mouth.      On the morning of your procedure, take your Aspirin and Plavix/Clopidogrel and any morning medicines NOT  listed above.  You may use sips of water.  5. Plan for one night stay--bring personal belongings. 6. Bring a current list of your medications and current insurance cards. 7. You MUST have a responsible person to drive you home. 8. Someone MUST be with you the first 24 hours after you arrive home or your discharge will be delayed. 9. Please wear clothes that are easy to get on and off and wear slip-on shoes.  Thank you for allowing [place] to care for you!   -- [facility]

## 2020-03-14 ENCOUNTER — Telehealth: Payer: Self-pay | Admitting: *Deleted

## 2020-03-14 ENCOUNTER — Other Ambulatory Visit: Payer: Self-pay | Admitting: *Deleted

## 2020-03-14 ENCOUNTER — Other Ambulatory Visit
Admission: RE | Admit: 2020-03-14 | Discharge: 2020-03-14 | Disposition: A | Discharge status: Home / Self Care | Payer: Medicare Other | Source: Ambulatory Visit | Attending: Cardiovascular Disease | Admitting: Cardiovascular Disease

## 2020-03-14 DIAGNOSIS — Z20822 Contact with and (suspected) exposure to covid-19: Secondary | ICD-10-CM | POA: Insufficient documentation

## 2020-03-14 DIAGNOSIS — N184 Chronic kidney disease, stage 4 (severe): Secondary | ICD-10-CM

## 2020-03-14 DIAGNOSIS — Z01812 Encounter for preprocedural laboratory examination: Secondary | ICD-10-CM | POA: Diagnosis not present

## 2020-03-14 DIAGNOSIS — I25118 Atherosclerotic heart disease of native coronary artery with other forms of angina pectoris: Secondary | ICD-10-CM

## 2020-03-14 NOTE — Patient Outreach (Signed)
[facility]) Care Management  03/14/2020  BENJAMAN ARTMAN Nov 29, 1955 [id number]   Telephone Assessment-Follow up  RN continue to follow his plan of care with daily weights and received the [facility] packet. RN strongly encouraged pt to review this information for further interventions as he continue to manage his HF. Pt reports a weight today at 222 lbs and yesterday 226 lbs with no symptoms and indicates he contacted his provider's office today thinking they were closed on Sunday. Denies any stress however strongly encouraged pt to contact the MD on call instead of awaiting the next business day for interventions especially if he is in the YELLOW with fluid retention (verbalized an understanding).   Plan: Will continue to verify no acute symptoms as pt denies any swelling at this time and no issues with his breathing. Will continue to discuss and review the current plan of care and encouraged adherence. Will follow up in one month with ongoing management of care.    Goals Addressed              This Visit's Progress   .  Get better (pt-stated)   On track     [place] (see longtitudinal plan of care for additional care plan information)   Current Barriers:  [name] Knowledge deficit related to basic heart failure pathophysiology and self care management . Knowledge Deficits related to heart failure medications  Case Manager Clinical Goal(s):  [name] Over the next 90 days, patient will verbalize understanding of Heart Failure Action Plan and when to call doctor . Over the next 30 days, patient will take all Heart Failure mediations as prescribed . Over the next 30 days, patient will weigh daily and record (notifying MD of 3 lb weight gain over night or 5 lb in a week)  Interventions:  . Basic overview and discussion of pathophysiology of Heart Failure reviewed  . Provided verbal education on low sodium diet . Advised patient to weigh each morning after emptying  bladder . Discussed importance of daily weight and advised patient to weigh and record daily  Patient Self Care Activities:  . Takes Heart Failure Medications as prescribed . Weighs daily and record (notifying MD of 3 lb weight gain over night or 5 lb in a week) . Verbalizes understanding of and follows CHF Action Plan . Adheres to low sodium diet  Initial goal documentation     .  Pharmacy Care Plan   On track     CARE PLAN ENTRY  Current Barriers:  . Chronic Disease Management support, education, and care coordination needs related to diabetes, hypertension, neuropathy  Pharmacist Clinical Goal(s):  [name] Over the next 6 weeks, patient will work with PharmD and primary care provider to address the following goals: o Diabetes: Maintain A1c goal of less than 7% with fasting blood glucose within 80-130 mg/dL and blood glucose 1-2 hours after meals less than 180 mg/dL; prevent hypoglycemia (blood glucose less than 70 mg/dL) o Hypertension: Maintain blood pressure goal of less than 140/90 mmHg; Monitor for orthostatic hypotension.  o Neuropathy/Low Back Pain: Improve pain and avoid NSAIDs due to kidney impairment   Interventions: . Comprehensive medication review performed . Coordinate pharmacy delivery services through [facility]  Patient Self Care Activities:  For the next 6 weeks until follow up visit:  . Diabetes: Monitor fasting blood glucose three times daily, document, and provide at next appointment. Call if any blood glucose readings < 70 mg/dL. [name] Hypertension Purchase a home blood  pressure monitor that goes around the upper arm. Monitor blood pressure three days per week in the morning prior to breakfast and medications, document, and provide at next appointment. Call if any dizziness or low blood pressure.  . Neuropathy/Low Back Pain: Continue Tylenol 500 mg 1 tablet every 4 hours for pain. Avoid all non-steroidal anti-inflammatory drugs including ibuprofen (Advil), naproxen  (Aleve), Goody's powder, Excedrin. . Starting pill packaging   Updated goal documentation       Raina Mina, RN Care Management Coordinator [place] Office [phone]

## 2020-03-14 NOTE — Telephone Encounter (Addendum)
Pt contacted pre-catheterization scheduled at [facility] for: Wednesday March 16, 2020 12:30 PM Verified arrival time and place: [facility]) at: 7:30 AM-pre-procedure hydration Per Dr Samara Deist hours of hydration before the procedure at a 100 mL/h.   No solid food after midnight prior to cath, clear liquids until 5 AM day of procedure.  Contrast allergy: 13 hour Prednisone and Benadryl Prep reviewed with patient: 03/15/20 Prednisone 50 mg 11:30 PM 03/16/20 Prednisone 50 mg 5:30 AM 03/16/20 Prednisone 50 mg and Bendaryl 50 mg -I have asked pt to take with him to hospital and take at 10:30 AM at hospital morning of procedure.  Hold: Torsemide-day before and day of procedure-GFR 20  Except hold medications AM meds can be  taken pre-cath with sips of water including: ASA 81 mg Plavix 75 mg  Confirmed patient has responsible adult to drive home post procedure and observe 24 hours after arriving home: yes  You are allowed ONE visitor in the waiting room during the time you are at the hospital for your procedure. Both you and your visitor must wear a mask once you enter the hospital.       COVID-19 Pre-Screening Questions:  . In the past 10 days have you had a new cough, shortness of breath, headache, congestion, fever (100 or greater) unexplained body aches, new sore throat, or sudden loss of taste or sense of smell? Shortness of breath last 20 days . In the past 10 days have you been around anyone with known Covid 19? no . Have you been vaccinated for COVID-19? Yes, see immunization history  Reviewed procedure/mask/visitor instructions, COVID-19 questions with patient.              Pt will get STAT BMP/CBC at [place] today or tomorrow.

## 2020-03-14 NOTE — Addendum Note (Signed)
Addended by: Katrine Coho on: 03/14/2020 03:03 PM   Modules accepted: Orders

## 2020-03-15 ENCOUNTER — Other Ambulatory Visit: Payer: Self-pay

## 2020-03-15 ENCOUNTER — Telehealth: Payer: Self-pay | Admitting: *Deleted

## 2020-03-15 ENCOUNTER — Other Ambulatory Visit
Admission: RE | Admit: 2020-03-15 | Discharge: 2020-03-15 | Disposition: A | Discharge status: Home / Self Care | Payer: Medicare Other | Attending: Cardiovascular Disease | Admitting: Cardiovascular Disease

## 2020-03-15 DIAGNOSIS — I25118 Atherosclerotic heart disease of native coronary artery with other forms of angina pectoris: Secondary | ICD-10-CM | POA: Diagnosis not present

## 2020-03-15 DIAGNOSIS — D649 Anemia, unspecified: Secondary | ICD-10-CM

## 2020-03-15 LAB — CBC
HCT: 28 % — ABNORMAL LOW (ref 39.0–52.0)
Hemoglobin: 8.8 g/dL — ABNORMAL LOW (ref 13.0–17.0)
MCH: 31.8 pg (ref 26.0–34.0)
MCHC: 31.4 g/dL (ref 30.0–36.0)
MCV: 101.1 fL — ABNORMAL HIGH (ref 80.0–100.0)
Platelets: 131 10*3/uL — ABNORMAL LOW (ref 150–400)
RBC: 2.77 MIL/uL — ABNORMAL LOW (ref 4.22–5.81)
RDW: 14 % (ref 11.5–15.5)
WBC: 6.2 10*3/uL (ref 4.0–10.5)
nRBC: 0 % (ref 0.0–0.2)

## 2020-03-15 LAB — BASIC METABOLIC PANEL
Anion gap: 12 (ref 5–15)
BUN: 52 mg/dL — ABNORMAL HIGH (ref 8–23)
CO2: 26 mmol/L (ref 22–32)
Calcium: 8.7 mg/dL — ABNORMAL LOW (ref 8.9–10.3)
Chloride: 100 mmol/L (ref 98–111)
Creatinine, Ser: 4.32 mg/dL — ABNORMAL HIGH (ref 0.61–1.24)
GFR calc Af Amer: 15 mL/min — ABNORMAL LOW (ref >60)
GFR calc non Af Amer: 13 mL/min — ABNORMAL LOW (ref >60)
Glucose, Bld: 165 mg/dL — ABNORMAL HIGH (ref 70–99)
Potassium: 4.6 mmol/L (ref 3.5–5.1)
Sodium: 138 mmol/L (ref 135–145)

## 2020-03-15 LAB — SARS CORONAVIRUS 2 (TAT 6-24 HRS): SARS Coronavirus 2: NEGATIVE

## 2020-03-15 NOTE — Telephone Encounter (Addendum)
Noted. Referral placed to [facility] Hematology. Dx chronic anemia.

## 2020-03-15 NOTE — Telephone Encounter (Signed)
Copied from Dr Fletcher Anon staff message 03/15/20 in reference to BMP/CBC done this morning: Unfortunately, his renal function is worse and his anemia has also worsened. It might be best to cancel his cath for tomorrow. We need to improve his anemia before proceeding with catheterization in case there is a bleeding complication.  This could be anemia of chronic kidney disease he might benefit from hematology evaluation. I copied Dr. [name] on this message for his input.  Follow-up with nephrology is also recommended as he is getting closer to needing dialysis.  ____  Call placed to patient and discussed lab findings and need to cancel cath. Pt aware I have cancelled R/LHC/G scheduled 03/16/20, pt will keep his already scheduled appt with Dr Fletcher Anon 03/25/20.  Pt reports his nephrologist is Dr [name] at [facility] in [place], he reports he saw him last week and has follow up appt scheduled in Sept. Pt aware I will forward a copy of 03/15/20 BMP/CBC to Dr [name] and asked pt to follow up with him. Pt  is aware Dr Fletcher Anon has messaged Dr [name] for his input regarding anemia, denies any signs of obvious blood loss.

## 2020-03-15 NOTE — Addendum Note (Signed)
Addended by: Lamar Laundry on: 03/15/2020 04:55 PM   Modules accepted: Orders

## 2020-03-15 NOTE — Addendum Note (Signed)
Addended by: Lamar Laundry on: 03/15/2020 04:45 PM   Modules accepted: Orders

## 2020-03-16 ENCOUNTER — Encounter (HOSPITAL_COMMUNITY): Admission: RE | Payer: Self-pay | Source: Home / Self Care

## 2020-03-16 ENCOUNTER — Ambulatory Visit (HOSPITAL_COMMUNITY): Admission: RE | Admit: 2020-03-16 | Payer: Medicare Other | Source: Home / Self Care | Admitting: Cardiovascular Disease

## 2020-03-16 SURGERY — RIGHT/LEFT HEART CATH AND CORONARY/GRAFT ANGIOGRAPHY
Anesthesia: LOCAL

## 2020-03-18 ENCOUNTER — Telehealth (INDEPENDENT_AMBULATORY_CARE_PROVIDER_SITE_OTHER): Payer: Self-pay

## 2020-03-18 ENCOUNTER — Telehealth: Payer: Self-pay | Admitting: *Deleted

## 2020-03-18 DIAGNOSIS — I10 Essential (primary) hypertension: Secondary | ICD-10-CM | POA: Diagnosis not present

## 2020-03-18 DIAGNOSIS — I214 Non-ST elevation (NSTEMI) myocardial infarction: Secondary | ICD-10-CM | POA: Diagnosis not present

## 2020-03-18 DIAGNOSIS — I5033 Acute on chronic diastolic (congestive) heart failure: Secondary | ICD-10-CM | POA: Diagnosis not present

## 2020-03-18 DIAGNOSIS — I2510 Atherosclerotic heart disease of native coronary artery without angina pectoris: Secondary | ICD-10-CM | POA: Diagnosis not present

## 2020-03-18 DIAGNOSIS — E1151 Type 2 diabetes mellitus with diabetic peripheral angiopathy without gangrene: Secondary | ICD-10-CM | POA: Diagnosis not present

## 2020-03-18 DIAGNOSIS — R21 Rash and other nonspecific skin eruption: Secondary | ICD-10-CM | POA: Diagnosis not present

## 2020-03-18 NOTE — Telephone Encounter (Signed)
Mitzi Hansen, RN with [facility] Left VM at triage. She said pt is up for re-certification and she needs a new verbal approval to continue seeing him 1x a week for 9 weeks  CB # [phone]

## 2020-03-18 NOTE — Telephone Encounter (Signed)
Spoke with the patient and he is scheduled with Dr. Delana Meyer for a permcath insertion on 03/23/20 with a 11:30 am arrival time to the [facility]. Covid testing is on 03/21/20 between 8-1 pm at the [facility]. Pre-procedure instructions were discussed and will be mailed.

## 2020-03-20 ENCOUNTER — Inpatient Hospital Stay
Admission: EM | Admit: 2020-03-20 | Discharge: 2020-03-21 | DRG: 286 | Disposition: A | Discharge status: Other Acute Inpatient Hospital | Payer: Medicare Other | Attending: Internal Medicine | Admitting: Internal Medicine

## 2020-03-20 ENCOUNTER — Other Ambulatory Visit: Payer: Self-pay

## 2020-03-20 ENCOUNTER — Encounter: Payer: Self-pay | Admitting: Emergency Medicine

## 2020-03-20 ENCOUNTER — Emergency Department: Payer: Medicare Other

## 2020-03-20 DIAGNOSIS — I130 Hypertensive heart and chronic kidney disease with heart failure and stage 1 through stage 4 chronic kidney disease, or unspecified chronic kidney disease: Secondary | ICD-10-CM | POA: Diagnosis present

## 2020-03-20 DIAGNOSIS — I350 Nonrheumatic aortic (valve) stenosis: Secondary | ICD-10-CM | POA: Diagnosis present

## 2020-03-20 DIAGNOSIS — R0689 Other abnormalities of breathing: Secondary | ICD-10-CM | POA: Diagnosis not present

## 2020-03-20 DIAGNOSIS — R079 Chest pain, unspecified: Secondary | ICD-10-CM | POA: Diagnosis not present

## 2020-03-20 DIAGNOSIS — I2510 Atherosclerotic heart disease of native coronary artery without angina pectoris: Secondary | ICD-10-CM | POA: Diagnosis not present

## 2020-03-20 DIAGNOSIS — Z951 Presence of aortocoronary bypass graft: Secondary | ICD-10-CM | POA: Diagnosis not present

## 2020-03-20 DIAGNOSIS — I739 Peripheral vascular disease, unspecified: Secondary | ICD-10-CM | POA: Diagnosis not present

## 2020-03-20 DIAGNOSIS — I5033 Acute on chronic diastolic (congestive) heart failure: Secondary | ICD-10-CM | POA: Diagnosis present

## 2020-03-20 DIAGNOSIS — E1151 Type 2 diabetes mellitus with diabetic peripheral angiopathy without gangrene: Secondary | ICD-10-CM | POA: Diagnosis present

## 2020-03-20 DIAGNOSIS — R9431 Abnormal electrocardiogram [ECG] [EKG]: Secondary | ICD-10-CM

## 2020-03-20 DIAGNOSIS — K219 Gastro-esophageal reflux disease without esophagitis: Secondary | ICD-10-CM | POA: Diagnosis present

## 2020-03-20 DIAGNOSIS — N184 Chronic kidney disease, stage 4 (severe): Secondary | ICD-10-CM | POA: Diagnosis present

## 2020-03-20 DIAGNOSIS — R0902 Hypoxemia: Secondary | ICD-10-CM | POA: Diagnosis not present

## 2020-03-20 DIAGNOSIS — D631 Anemia in chronic kidney disease: Secondary | ICD-10-CM | POA: Diagnosis present

## 2020-03-20 DIAGNOSIS — I200 Unstable angina: Secondary | ICD-10-CM | POA: Diagnosis not present

## 2020-03-20 DIAGNOSIS — Z20822 Contact with and (suspected) exposure to covid-19: Secondary | ICD-10-CM | POA: Diagnosis present

## 2020-03-20 DIAGNOSIS — Z888 Allergy status to other drugs, medicaments and biological substances status: Secondary | ICD-10-CM | POA: Diagnosis not present

## 2020-03-20 DIAGNOSIS — I6522 Occlusion and stenosis of left carotid artery: Secondary | ICD-10-CM | POA: Diagnosis present

## 2020-03-20 DIAGNOSIS — E785 Hyperlipidemia, unspecified: Secondary | ICD-10-CM | POA: Diagnosis present

## 2020-03-20 DIAGNOSIS — I214 Non-ST elevation (NSTEMI) myocardial infarction: Secondary | ICD-10-CM | POA: Diagnosis not present

## 2020-03-20 DIAGNOSIS — Z880 Allergy status to penicillin: Secondary | ICD-10-CM | POA: Diagnosis not present

## 2020-03-20 DIAGNOSIS — N179 Acute kidney failure, unspecified: Secondary | ICD-10-CM | POA: Diagnosis present

## 2020-03-20 DIAGNOSIS — E1122 Type 2 diabetes mellitus with diabetic chronic kidney disease: Secondary | ICD-10-CM | POA: Diagnosis present

## 2020-03-20 DIAGNOSIS — E119 Type 2 diabetes mellitus without complications: Secondary | ICD-10-CM | POA: Diagnosis not present

## 2020-03-20 DIAGNOSIS — L409 Psoriasis, unspecified: Secondary | ICD-10-CM | POA: Diagnosis present

## 2020-03-20 DIAGNOSIS — N2581 Secondary hyperparathyroidism of renal origin: Secondary | ICD-10-CM | POA: Diagnosis present

## 2020-03-20 DIAGNOSIS — E1165 Type 2 diabetes mellitus with hyperglycemia: Secondary | ICD-10-CM | POA: Diagnosis present

## 2020-03-20 DIAGNOSIS — I252 Old myocardial infarction: Secondary | ICD-10-CM

## 2020-03-20 DIAGNOSIS — N185 Chronic kidney disease, stage 5: Secondary | ICD-10-CM | POA: Diagnosis not present

## 2020-03-20 DIAGNOSIS — J811 Chronic pulmonary edema: Secondary | ICD-10-CM | POA: Diagnosis not present

## 2020-03-20 DIAGNOSIS — D649 Anemia, unspecified: Secondary | ICD-10-CM | POA: Diagnosis not present

## 2020-03-20 DIAGNOSIS — I517 Cardiomegaly: Secondary | ICD-10-CM | POA: Diagnosis not present

## 2020-03-20 DIAGNOSIS — E1129 Type 2 diabetes mellitus with other diabetic kidney complication: Secondary | ICD-10-CM

## 2020-03-20 DIAGNOSIS — I25110 Atherosclerotic heart disease of native coronary artery with unstable angina pectoris: Principal | ICD-10-CM | POA: Diagnosis present

## 2020-03-20 DIAGNOSIS — Z86718 Personal history of other venous thrombosis and embolism: Secondary | ICD-10-CM | POA: Diagnosis not present

## 2020-03-20 DIAGNOSIS — I129 Hypertensive chronic kidney disease with stage 1 through stage 4 chronic kidney disease, or unspecified chronic kidney disease: Secondary | ICD-10-CM | POA: Diagnosis not present

## 2020-03-20 DIAGNOSIS — I509 Heart failure, unspecified: Secondary | ICD-10-CM | POA: Diagnosis not present

## 2020-03-20 DIAGNOSIS — E118 Type 2 diabetes mellitus with unspecified complications: Secondary | ICD-10-CM | POA: Diagnosis not present

## 2020-03-20 DIAGNOSIS — Z91041 Radiographic dye allergy status: Secondary | ICD-10-CM | POA: Diagnosis not present

## 2020-03-20 LAB — RETICULOCYTES
Immature Retic Fract: 26.3 % — ABNORMAL HIGH (ref 2.3–15.9)
RBC.: 2.48 MIL/uL — ABNORMAL LOW (ref 4.22–5.81)
Retic Count, Absolute: 89.3 10*3/uL (ref 19.0–186.0)
Retic Ct Pct: 3.6 % — ABNORMAL HIGH (ref 0.4–3.1)

## 2020-03-20 LAB — TROPONIN I (HIGH SENSITIVITY)
Troponin I (High Sensitivity): 19 ng/L — ABNORMAL HIGH (ref <18)
Troponin I (High Sensitivity): 51 ng/L — ABNORMAL HIGH (ref <18)
Troponin I (High Sensitivity): 86 ng/L — ABNORMAL HIGH (ref <18)

## 2020-03-20 LAB — IRON AND TIBC
Iron: 138 ug/dL (ref 45–182)
Saturation Ratios: 36 % (ref 17.9–39.5)
TIBC: 382 ug/dL (ref 250–450)
UIBC: 244 ug/dL

## 2020-03-20 LAB — BASIC METABOLIC PANEL
Anion gap: 13 (ref 5–15)
BUN: 63 mg/dL — ABNORMAL HIGH (ref 8–23)
CO2: 20 mmol/L — ABNORMAL LOW (ref 22–32)
Calcium: 7.8 mg/dL — ABNORMAL LOW (ref 8.9–10.3)
Chloride: 105 mmol/L (ref 98–111)
Creatinine, Ser: 4.21 mg/dL — ABNORMAL HIGH (ref 0.61–1.24)
GFR calc Af Amer: 16 mL/min — ABNORMAL LOW (ref >60)
GFR calc non Af Amer: 14 mL/min — ABNORMAL LOW (ref >60)
Glucose, Bld: 178 mg/dL — ABNORMAL HIGH (ref 70–99)
Potassium: 3.8 mmol/L (ref 3.5–5.1)
Sodium: 138 mmol/L (ref 135–145)

## 2020-03-20 LAB — CREATININE, SERUM
Creatinine, Ser: 4.58 mg/dL — ABNORMAL HIGH (ref 0.61–1.24)
GFR calc Af Amer: 14 mL/min — ABNORMAL LOW (ref >60)
GFR calc non Af Amer: 12 mL/min — ABNORMAL LOW (ref >60)

## 2020-03-20 LAB — APTT: aPTT: 34 seconds (ref 24–36)

## 2020-03-20 LAB — GLUCOSE, CAPILLARY
Glucose-Capillary: 147 mg/dL — ABNORMAL HIGH (ref 70–99)
Glucose-Capillary: 169 mg/dL — ABNORMAL HIGH (ref 70–99)
Glucose-Capillary: 228 mg/dL — ABNORMAL HIGH (ref 70–99)

## 2020-03-20 LAB — MAGNESIUM: Magnesium: 2.2 mg/dL (ref 1.7–2.4)

## 2020-03-20 LAB — PROTIME-INR
INR: 1.1 (ref 0.8–1.2)
Prothrombin Time: 14 seconds (ref 11.4–15.2)

## 2020-03-20 LAB — FERRITIN: Ferritin: 32 ng/mL (ref 24–336)

## 2020-03-20 LAB — CBC WITH DIFFERENTIAL/PLATELET
Abs Immature Granulocytes: 0.02 10*3/uL (ref 0.00–0.07)
Basophils Absolute: 0 10*3/uL (ref 0.0–0.1)
Basophils Relative: 1 %
Eosinophils Absolute: 0.2 10*3/uL (ref 0.0–0.5)
Eosinophils Relative: 3 %
HCT: 24.3 % — ABNORMAL LOW (ref 39.0–52.0)
Hemoglobin: 8 g/dL — ABNORMAL LOW (ref 13.0–17.0)
Immature Granulocytes: 0 %
Lymphocytes Relative: 22 %
Lymphs Abs: 1.5 10*3/uL (ref 0.7–4.0)
MCH: 32.1 pg (ref 26.0–34.0)
MCHC: 32.9 g/dL (ref 30.0–36.0)
MCV: 97.6 fL (ref 80.0–100.0)
Monocytes Absolute: 0.6 10*3/uL (ref 0.1–1.0)
Monocytes Relative: 9 %
Neutro Abs: 4.3 10*3/uL (ref 1.7–7.7)
Neutrophils Relative %: 65 %
Platelets: 125 10*3/uL — ABNORMAL LOW (ref 150–400)
RBC: 2.49 MIL/uL — ABNORMAL LOW (ref 4.22–5.81)
RDW: 14.3 % (ref 11.5–15.5)
WBC: 6.6 10*3/uL (ref 4.0–10.5)
nRBC: 0 % (ref 0.0–0.2)

## 2020-03-20 LAB — BRAIN NATRIURETIC PEPTIDE: B Natriuretic Peptide: 503.2 pg/mL — ABNORMAL HIGH (ref 0.0–100.0)

## 2020-03-20 LAB — FOLATE: Folate: 10.1 ng/mL (ref >5.9)

## 2020-03-20 LAB — SARS CORONAVIRUS 2 BY RT PCR (HOSPITAL ORDER, PERFORMED IN ~~LOC~~ HOSPITAL LAB): SARS Coronavirus 2: NEGATIVE

## 2020-03-20 LAB — HEPARIN LEVEL (UNFRACTIONATED): Heparin Unfractionated: 0.17 IU/mL — ABNORMAL LOW (ref 0.30–0.70)

## 2020-03-20 LAB — VITAMIN B12: Vitamin B-12: 281 pg/mL (ref 180–914)

## 2020-03-20 MED ORDER — SODIUM CHLORIDE 0.9 % IV SOLN: INTRAVENOUS | Status: DC

## 2020-03-20 MED ORDER — NITROGLYCERIN IN D5W 200-5 MCG/ML-% IV SOLN
0.0000 ug/min | INTRAVENOUS | Status: DC
  Administered 2020-03-20: 11 ug/min via INTRAVENOUS
  Administered 2020-03-20: 5 ug/min via INTRAVENOUS
  Filled 2020-03-20 (×3): qty 250

## 2020-03-20 MED ORDER — ONDANSETRON HCL 4 MG/2ML IJ SOLN
4.0000 mg | Freq: Four times a day (QID) | INTRAMUSCULAR | Status: DC | PRN
  Filled 2020-03-20: qty 2

## 2020-03-20 MED ORDER — ASPIRIN 81 MG PO CHEW
81.0000 mg | CHEWABLE_TABLET | ORAL | Status: AC
  Administered 2020-03-21: 81 mg via ORAL
  Filled 2020-03-20: qty 1

## 2020-03-20 MED ORDER — BUSPIRONE HCL 10 MG PO TABS
20.0000 mg | ORAL_TABLET | Freq: Two times a day (BID) | ORAL | Status: DC
  Administered 2020-03-20 (×2): 20 mg via ORAL
  Filled 2020-03-20 (×4): qty 2

## 2020-03-20 MED ORDER — HEPARIN BOLUS VIA INFUSION
2600.0000 [IU] | Freq: Once | INTRAVENOUS | Status: AC
  Administered 2020-03-20: 2600 [IU] via INTRAVENOUS
  Filled 2020-03-20: qty 2600

## 2020-03-20 MED ORDER — CALCITRIOL 0.25 MCG PO CAPS
0.2500 ug | ORAL_CAPSULE | ORAL | Status: DC
  Filled 2020-03-20: qty 1

## 2020-03-20 MED ORDER — ACETAMINOPHEN 325 MG PO TABS
650.0000 mg | ORAL_TABLET | Freq: Four times a day (QID) | ORAL | Status: DC | PRN
  Administered 2020-03-20: 650 mg via ORAL
  Filled 2020-03-20: qty 2

## 2020-03-20 MED ORDER — HEPARIN SODIUM (PORCINE) 5000 UNIT/ML IJ SOLN: 5000.0000 [IU] | Freq: Three times a day (TID) | INTRAMUSCULAR | Status: DC

## 2020-03-20 MED ORDER — TAMSULOSIN HCL 0.4 MG PO CAPS
0.4000 mg | ORAL_CAPSULE | Freq: Every day | ORAL | Status: DC
  Administered 2020-03-20: 0.4 mg via ORAL
  Filled 2020-03-20: qty 1

## 2020-03-20 MED ORDER — INSULIN ASPART 100 UNIT/ML ~~LOC~~ SOLN
0.0000 [IU] | Freq: Every day | SUBCUTANEOUS | Status: DC
  Administered 2020-03-20: 2 [IU] via SUBCUTANEOUS
  Filled 2020-03-20: qty 1

## 2020-03-20 MED ORDER — HEPARIN (PORCINE) 25000 UT/250ML-% IV SOLN
1450.0000 [IU]/h | INTRAVENOUS | Status: DC
  Administered 2020-03-20: 1100 [IU]/h via INTRAVENOUS
  Administered 2020-03-21: 1450 [IU]/h via INTRAVENOUS
  Filled 2020-03-20 (×2): qty 250

## 2020-03-20 MED ORDER — MORPHINE SULFATE (PF) 2 MG/ML IV SOLN
0.5000 mg | INTRAVENOUS | Status: DC | PRN
  Administered 2020-03-20 (×2): 0.5 mg via INTRAVENOUS
  Filled 2020-03-20 (×2): qty 1

## 2020-03-20 MED ORDER — FUROSEMIDE 40 MG PO TABS
40.0000 mg | ORAL_TABLET | Freq: Two times a day (BID) | ORAL | Status: DC
  Administered 2020-03-20: 40 mg via ORAL
  Filled 2020-03-20: qty 1

## 2020-03-20 MED ORDER — FINASTERIDE 5 MG PO TABS
5.0000 mg | ORAL_TABLET | Freq: Every day | ORAL | Status: DC
  Administered 2020-03-20: 5 mg via ORAL
  Filled 2020-03-20: qty 1

## 2020-03-20 MED ORDER — INSULIN ASPART 100 UNIT/ML ~~LOC~~ SOLN
0.0000 [IU] | Freq: Three times a day (TID) | SUBCUTANEOUS | Status: DC
  Administered 2020-03-20: 2 [IU] via SUBCUTANEOUS
  Administered 2020-03-21: 5 [IU] via SUBCUTANEOUS
  Filled 2020-03-20: qty 1

## 2020-03-20 MED ORDER — CARVEDILOL 12.5 MG PO TABS
12.5000 mg | ORAL_TABLET | Freq: Every day | ORAL | Status: DC
  Administered 2020-03-20: 12.5 mg via ORAL
  Filled 2020-03-20: qty 1

## 2020-03-20 MED ORDER — DIPHENHYDRAMINE HCL 25 MG PO CAPS
50.0000 mg | ORAL_CAPSULE | Freq: Once | ORAL | Status: AC
  Administered 2020-03-21: 50 mg via ORAL
  Filled 2020-03-20: qty 2

## 2020-03-20 MED ORDER — FUROSEMIDE 10 MG/ML IJ SOLN: 40.0000 mg | Freq: Two times a day (BID) | INTRAMUSCULAR | Status: DC

## 2020-03-20 MED ORDER — ASPIRIN EC 81 MG PO TBEC
81.0000 mg | DELAYED_RELEASE_TABLET | Freq: Every day | ORAL | Status: DC
  Administered 2020-03-20: 81 mg via ORAL
  Filled 2020-03-20: qty 1

## 2020-03-20 MED ORDER — PREDNISONE 50 MG PO TABS
50.0000 mg | ORAL_TABLET | Freq: Every day | ORAL | Status: DC
  Administered 2020-03-20 – 2020-03-21 (×2): 50 mg via ORAL
  Filled 2020-03-20 (×2): qty 1

## 2020-03-20 MED ORDER — ONDANSETRON HCL 4 MG PO TABS: 4.0000 mg | ORAL_TABLET | Freq: Four times a day (QID) | ORAL | Status: DC | PRN

## 2020-03-20 MED ORDER — ALPRAZOLAM 0.5 MG PO TABS
0.5000 mg | ORAL_TABLET | Freq: Once | ORAL | Status: AC
  Administered 2020-03-20: 0.5 mg via ORAL
  Filled 2020-03-20: qty 1

## 2020-03-20 MED ORDER — POLYETHYLENE GLYCOL 3350 17 G PO PACK: 17.0000 g | PACK | Freq: Every day | ORAL | Status: DC | PRN

## 2020-03-20 MED ORDER — AMLODIPINE BESYLATE 5 MG PO TABS
2.5000 mg | ORAL_TABLET | Freq: Every day | ORAL | Status: DC
  Administered 2020-03-20: 2.5 mg via ORAL
  Filled 2020-03-20: qty 1

## 2020-03-20 MED ORDER — ACETAMINOPHEN 650 MG RE SUPP: 650.0000 mg | Freq: Four times a day (QID) | RECTAL | Status: DC | PRN

## 2020-03-20 MED ORDER — SIMVASTATIN 20 MG PO TABS
20.0000 mg | ORAL_TABLET | Freq: Every evening | ORAL | Status: DC
  Administered 2020-03-20 – 2020-03-21 (×2): 20 mg via ORAL
  Filled 2020-03-20: qty 1

## 2020-03-20 NOTE — H&P (Signed)
[facility] NAME: [name] [name]    MR#:  [id number]  DATE OF BIRTH:  [birth year]  DATE OF ADMISSION:  03/20/2020  PRIMARY CARE PHYSICIAN: [name], MD   REQUESTING/REFERRING PHYSICIAN: Dr. Hulan Saas primary cardiologist Dr. Fletcher Anon  Patient coming from : home   CHIEF COMPLAINT:  chest pain and exertional shortness of breath  HISTORY OF PRESENT ILLNESS:  Antonyo [name]  is a 64 y.o. male with a known history of coronary artery disease status post CABG in 2012, moderate aortic stenosis, carotid artery stenosis status post left-sided CEA, type II diabetes on insulin, CKD stage IV, hypertension, hyper hyperlipidemia, PAD status post extensive lower extremity revascularization by surgery, obesity comes to the emergency room with increasing exertional shortness of breath and chest pain. ED course: afebrile blood pressure 134/81 normal sinus rhythm on EKG heart rate 96 sats hundred percent on room air troponin 15-- 19 BNP 503 hemoglobin 8.0 creatinine 4.2 (baseline creatinine 3.5-3.7) PAST MEDICAL HISTORY:   Past Medical History:  Diagnosis Date  . Adenomatous colon polyp   . Allergy   . Angina at [facility])    chronic  . Anxiety   . Arthritis    RA  . Bell's palsy 2007  . Carotid artery occlusion   . Cataract    Dr. Dawna Part  . CHF (congestive heart failure) ([place])   . Coronary artery disease   . Depression   . Diabetes mellitus   . Diverticulosis   . Duodenitis   . DVT (deep venous thrombosis) (H[facility])    in leg  . Dyspnea    DOE  . Gastropathy   . GERD (gastroesophageal reflux disease)   . Heart murmur   . Helicobacter pylori gastritis   . Hyperlipidemia   . Hypertension   . Kidney failure   . Mild aortic stenosis   . Neuropathy   . Obesity   . Peripheral vascular disease ([place])   . Post splenectomy syndrome   . Psoriasis   . Psoriatic arthritis ([place])   . Renal disorder    stage 4 kidney failure  .  Sleep apnea    uses cpap  . SOB (shortness of breath)   . Stroke ([place])   . Tobacco use disorder    recently quit  . Tubular adenoma 08/2013   Dr. Hilarie Fredrickson  . Weak urinary stream     PAST SURGICAL HISTOIRY:   Past Surgical History:  Procedure Laterality Date  . CARDIAC CATHETERIZATION  08/2010   LAD: 80% ISR, RCA: 80% ostial, OM 80-90%  . CAROTID ENDARTERECTOMY  08/2010   left/ Dr. Kellie Simmering  . CATARACT EXTRACTION W/PHACO Left 07/09/2017   Procedure: CATARACT EXTRACTION PHACO AND INTRAOCULAR LENS PLACEMENT (IOC);  Surgeon: Birder Robson, MD;  Location: [facility] ORS;  Service: Ophthalmology;  Laterality: Left;  [place] 00:23 AP% 14.2 CDE 3.26 Fluid pack lot # [id number]  . CATARACT EXTRACTION W/PHACO Right 09/10/2017   Procedure: CATARACT EXTRACTION PHACO AND INTRAOCULAR LENS PLACEMENT (IOC);  Surgeon: Birder Robson, MD;  Location: [facility] ORS;  Service: Ophthalmology;  Laterality: Right;  [place] 00:26.4 AP% 17.3 CDE 4.59 Fluid Pack Lot # [id number]  . COLONOSCOPY    . CORONARY ANGIOPLASTY     LAD: before CABG  . CORONARY ARTERY BYPASS GRAFT  09/06/2010   At [facility]: LIMA to LAD, left radial to RCA, sequential SVG to OM3 and 4  . heart stents  Jan 2011   leg stents 06/2009 and  03/2010  . LOWER EXTREMITY ANGIOGRAPHY Left 06/09/2019   Procedure: LOWER EXTREMITY ANGIOGRAPHY;  Surgeon: Katha Cabal, MD;  Location: [facility];  Service: Cardiovascular;  Laterality: Left;  . PTA of illiac and SFA  multiple   Dr. Ronalee Belts, s/p revision 11/2012  . RIGHT/LEFT HEART CATH AND CORONARY/GRAFT ANGIOGRAPHY N/A 02/15/2020   Procedure: RIGHT/LEFT HEART CATH AND CORONARY/GRAFT ANGIOGRAPHY;  Surgeon: Wellington Hampshire, MD;  Location: [facility];  Service: Cardiovascular;  Laterality: N/A;  . SPLENECTOMY    . VASCULAR SURGERY     LEG STENTS    SOCIAL HISTORY:   Social History   Tobacco Use  . Smoking status: Former Smoker    Packs/day: 0.75    Years: 40.00    Pack years: 30.00     Types: Cigarettes    Quit date: 04/15/2017    Years since quitting: 2.9  . Smokeless tobacco: Never Used  . Tobacco comment: 1 cigarette a day  Substance Use Topics  . Alcohol use: No    FAMILY HISTORY:   Family History  Problem Relation Age of Onset  . Lung cancer Father 65  . Arthritis Father   . Breast cancer Sister 6  . Alcoholism Sister   . Dementia Mother   . Alzheimer's disease Mother   . Arthritis Mother   . Alcoholism Brother   . Epilepsy Sister   . Diabetes Paternal Grandfather   . Colon polyps Paternal Grandfather 86  . Alcoholism Sister   . Alcoholism Sister   . Alcoholism Brother     DRUG ALLERGIES:   Allergies  Allergen Reactions  . Contrast Media [Iodinated Diagnostic Agents] Rash and Other (See Comments)    Got very hot and red   . Glipizide Other (See Comments)    ANTIDIABETICS. Burning  . Metrizamide Other (See Comments)    Got very hot and red  . Penicillins Hives and Swelling    Has patient had a PCN reaction causing immediate rash, facial/tongue/throat swelling, SOB or lightheadedness with hypotension: yes Has patient had a PCN reaction causing severe rash involving mucus membranes or skin necrosis: no  Has patient had a PCN reaction that required hospitalization: yes Has patient had a PCN reaction occurring within the last 10 years: no If all of the above answers are "NO", then may proceed with Cephalosporin use.   [name] Hydroxychloroquine Other (See Comments)    Stomach upset    REVIEW OF SYSTEMS:  Review of Systems  Constitutional: Negative for chills, fever and weight loss.  HENT: Negative for ear discharge, ear pain and nosebleeds.   Eyes: Negative for blurred vision, pain and discharge.  Respiratory: Positive for shortness of breath. Negative for sputum production, wheezing and stridor.   Cardiovascular: Positive for chest pain. Negative for palpitations, orthopnea and PND.  Gastrointestinal: Negative for abdominal pain, diarrhea,  nausea and vomiting.  Genitourinary: Negative for frequency and urgency.  Musculoskeletal: Positive for back pain and joint pain.  Neurological: Positive for weakness. Negative for sensory change, speech change and focal weakness.  Psychiatric/Behavioral: Negative for depression and hallucinations. The patient is not nervous/anxious.      MEDICATIONS AT HOME:   Prior to Admission medications   Medication Sig Start Date End Date Taking? Authorizing Provider  albuterol (PROAIR HFA) 108 (90 Base) MCG/ACT inhaler Inhale 2 puffs into the lungs every 6 (six) hours as needed for shortness of breath. 09/14/19   [name], MD  amLODipine (NORVASC) 2.5 MG tablet Take  1 tablet (2.5 mg total) by mouth daily. 02/19/20   Sharen Hones, MD  aspirin 81 MG tablet Take 81 mg by mouth daily.    [provider]  buPROPion (WELLBUTRIN SR) 150 MG 12 hr tablet Take 1 tablet (150 mg total) by mouth daily. 02/23/20   [name], MD  busPIRone (BUSPAR) 10 MG tablet TAKE 2 TABLETS(20 MG) BY MOUTH TWICE DAILY Patient taking differently: Take 20 mg by mouth 2 (two) times daily.  02/19/20   [name], MD  calcitRIOL (ROCALTROL) 0.25 MCG capsule Take 0.25 mcg by mouth every Monday, Wednesday, and Friday. Monday then Wednesday then Friday    [provider]  carvedilol (COREG) 12.5 MG tablet Take 1 tablet (12.5 mg total) by mouth 2 (two) times daily. Patient taking differently: Take 12.5 mg by mouth daily.  12/10/19   Wellington Hampshire, MD  clopidogrel (PLAVIX) 75 MG tablet Take 75 mg by mouth daily.    [provider]  Dextran 70-Hypromellose (ARTIFICIAL TEARS) 0.1-0.3 % SOLN Place 1 drop into both eyes 4 (four) times daily as needed (dry eyes).    [provider]  finasteride (PROSCAR) 5 MG tablet Take 5 mg by mouth daily.    [provider]  isosorbide mononitrate (IMDUR) 30 MG 24 hr tablet Take 3 tablets (90 mg total) by mouth daily. 02/23/20   Dunn, Areta Haber, PA-C   nitroGLYCERIN (NITROSTAT) 0.4 MG SL tablet Place 1 tablet (0.4 mg total) under the tongue every 5 (five) minutes as needed. Maximum of 3 doses. Patient taking differently: Place 0.4 mg under the tongue every 5 (five) minutes as needed for chest pain. Maximum of 3 doses. 02/23/20   Dunn, Areta Haber, PA-C  predniSONE (DELTASONE) 50 MG tablet Take 1 tablet at 11 pm on night before procedure, take 1 tablet at 5 am the morning of procedure and take 1 tablet at 9:30 AM the morning of procedure 03/11/20   [name], PA-C  sertraline (ZOLOFT) 100 MG tablet Take 1 tablet (100 mg total) by mouth daily. 02/23/20   [name], MD  simvastatin (ZOCOR) 20 MG tablet TAKE 1 TABLET(20 MG) BY MOUTH DAILY AT 6 PM Patient taking differently: Take 20 mg by mouth every evening.  11/20/19   Wellington Hampshire, MD  tamsulosin (FLOMAX) 0.4 MG CAPS capsule Take 1 capsule by mouth daily Patient taking differently: Take 0.4 mg by mouth daily.  01/20/20   [name], MD  torsemide (DEMADEX) 20 MG tablet Take 2 tablets (40 mg total) by mouth daily. 02/24/20   [name], MD      VITAL SIGNS:  Blood pressure 135/64, pulse 93, temperature 97.7 F (36.5 C), temperature source Oral, resp. rate 12, height 5\' 6"  (1.676 m), weight 102.1 kg, SpO2 96 %.  PHYSICAL EXAMINATION:  GENERAL:  64 y.o.-year-old patient lying in the bed with no acute distress. Morbidly obese EYES: Pupils equal, round, reactive to light and accommodation. No scleral icterus.  HEENT: Head atraumatic, normocephalic. Oropharynx and nasopharynx clear.  LUNGS: Normal breath sounds bilaterally, no wheezing, rales,rhonchi or crepitation. No use of accessory muscles of respiration.  CARDIOVASCULAR: S1, S2 normal. No murmurs, rubs, or gallops.  ABDOMEN: Soft, nontender, nondistended. Bowel sounds present. No organomegaly or mass.  EXTREMITIES: No pedal edema, cyanosis, or clubbing.  NEUROLOGIC: Cranial nerves II through XII are intact. Muscle strength  5/5 in all extremities. Sensation intact. Gait not checked.  PSYCHIATRIC: The patient is alert and  oriented x 3.  SKIN: No obvious rash, lesion, or ulcer.   LABORATORY PANEL:   CBC Recent Labs  Lab 03/20/20 0620  WBC 6.6  HGB 8.0*  HCT 24.3*  PLT 125*   ------------------------------------------------------------------------------------------------------------------  Chemistries  Recent Labs  Lab 03/20/20 0620  NA 138  K 3.8  CL 105  CO2 20*  GLUCOSE 178*  BUN 63*  CREATININE 4.21*  CALCIUM 7.8*  MG 2.2   ------------------------------------------------------------------------------------------------------------------  Cardiac Enzymes No results for input(s): TROPONINI in the last 168 hours. ------------------------------------------------------------------------------------------------------------------  RADIOLOGY:  DG Chest Portable 1 View  Result Date: 03/20/2020 CLINICAL DATA:  Chest pain. EXAM: PORTABLE CHEST 1 VIEW COMPARISON:  February 15, 2020 FINDINGS: No pneumothorax. Stable cardiomegaly. Diffuse interstitial opacities are identified. No nodules or masses. No focal infiltrates. IMPRESSION: Diffuse interstitial opacities suggestive of pulmonary edema given cardiomegaly. Atypical infection could have a similar appearance. Recommend clinical correlation. Electronically Signed   By: Dorise Bullion III M.D   On: 03/20/2020 08:08    EKG:    IMPRESSION AND PLAN:   Berley Gambrell  is a 64 y.o. male with a known history of coronary artery disease status post CABG in 2012, moderate aortic stenosis, carotid artery stenosis status post left-sided CEA, type II diabetes on insulin, CKD stage IV, hypertension, hyper hyperlipidemia, PAD status post extensive lower extremity revascularization by surgery, obesity comes to the emergency room with increasing exertional shortness of breath and chest pain.  1. unstable angina/exertional shortness of breath CAD with history of  CABG 2012 history of atherosclerosis -admit to progressive cardiac unit -cardiology consultation with Dr. Rockey Situ-- recommends continue IV Nitro drip. Start IV heparin drip -troponin times two -continue Coreg, statins -ace inhibitor contraindication due to CKD stage IV -patient is in the process of getting right heart catheterization which is [facility] to be [facility] at [facility] next Wednesday-- will await further cardiac recommendations -holding aspirin Plavix-- in anticipation for RHC  2. CKD stage IV, type II diabetes, CAD, hypertension -patient follows with Dr. [name] -nephrology consultation with dr Theador Hawthorne -avoid nephrotoxic agents  3. Hypertension -BP stable -continue amlodipine and Coreg  4. Hyperlipidemia -continue statins  5. Morbid obesity -lifestyle changes discussed  6. Anemia of chronic disease -hemoglobin stable at 8.0  7. BPH continue Flomax and Proscar  8. Type II diabetes, insulin requiring, hyperglycemia with CKD stage IV, neuropathy, peripheral arterial disease -sliding scale insulin -check A1c  Family Communication : none Consults : cardiology, nephrology Code Status : full code per patient DVT prophylaxis : heparin drip admission status inpatient TOTAL TIME TAKING CARE OF THIS PATIENT: *55* minutes.    Fritzi Mandes M.D  [facility]     CC: Primary care physician; [name], MD

## 2020-03-20 NOTE — Telephone Encounter (Signed)
That is fine 

## 2020-03-20 NOTE — ED Notes (Signed)
Pt assisted up to bathroom at this time. Pt also has tray at bedside to eat at this time

## 2020-03-20 NOTE — ED Notes (Signed)
When RN was with another critical pt, this pt called for assistance to bathroom. Rn had previously attempted to place male external cath on pt earlier in day but pt refused. pt had gotten up unattended and removed both of his IV sites in left arm. IV medications restarted at this time

## 2020-03-20 NOTE — Progress Notes (Signed)
[name] [name]  MRN: [id number]  DOB/AGE: Mar 28, 1956 64 y.o.  Primary Care Physician:Letvak, Theophilus Kinds, MD  Admit date: 03/20/2020  Chief Complaint:  Chief Complaint  Patient presents with  . Chest Pain    S-Pt presented on  03/20/2020 with  Chief Complaint  Patient presents with  . Chest Pain  . [name] [name]  is a 64 y.o. male with a known history of coronary artery disease status post CABG in 2012, moderate aortic stenosis, carotid artery stenosis status post left-sided CEA, type II diabetes on insulin, CKD stage IV, hypertension, hyper hyperlipidemia, PAD status post extensive lower extremity revascularization by surgery, obesity comes to the emergency room with increasing exertional shortness of breath and chest pain.  Nephrology was consulted for comanagement of CKD stage IV/V patient Patient was seen on second floor Patient main complaint was I am waiting to have my cath done. I then took the opportunity and did discuss patient GFR/kidney H/possibly needing renal replacement therapy if and when the catheter is done. Patient understand the risk versus benefit. Patient other major concern was I have the swelling No complaint of fever/cough/chills No complaint of nausea/vomiting/diarrhea    Medications    . amLODipine  2.5 mg Oral Daily  . aspirin EC  81 mg Oral Daily  . busPIRone  20 mg Oral BID  . [START ON 03/21/2020] calcitRIOL  0.25 mcg Oral Q M,W,F  . carvedilol  12.5 mg Oral Daily  . finasteride  5 mg Oral Daily  . insulin aspart  0-5 Units Subcutaneous QHS  . insulin aspart  0-9 Units Subcutaneous TID WC  . simvastatin  20 mg Oral QPM  . tamsulosin  0.4 mg Oral Daily         GHW:EXHBZ from the symptoms mentioned above,there are no other symptoms referable to all systems reviewed.  Physical Exam: Vital signs in last 24 hours: Temp:  [97.7 F (36.5 C)-98.3 F (36.8 C)] 98.3 F (36.8 C) (08/29 1238) Pulse Rate:  [93-105] 105 (08/29 1238) Resp:   [12-22] 19 (08/29 1238) BP: (132-148)/(64-93) 148/84 (08/29 1238) SpO2:  [94 %-100 %] 98 % (08/29 1238) Weight:  [102.1 kg-106 kg] 106 kg (08/29 1238) Weight change:     Intake/Output from previous day: No intake/output data recorded. Total I/O In: 32.8 [I.V.:32.8] Out: -    Physical Exam: General- pt is awake,alert, oriented to time place and person Resp- No acute REsp distress,Rhonchi at bases CVS- S1S2 regular in rate and rhythm GIT- BS+, soft, NT, ND EXT-1+ LE Edema, Cyanosis   Lab Results: CBC Recent Labs    03/20/20 0620  WBC 6.6  HGB 8.0*  HCT 24.3*  PLT 125*    BMET Recent Labs    03/20/20 0620 03/20/20 0919  NA 138  --   K 3.8  --   CL 105  --   CO2 20*  --   GLUCOSE 178*  --   BUN 63*  --   CREATININE 4.21* 4.58*  CALCIUM 7.8*  --     Creatinine trend. 2021 4.2--4.6 now during August admission 4.2  probably new baseline 2.1--4.4 AKI during late July admission 2020 2.7--3.1 2019 2.8 2018 2.0 2017 1.5--1.6 2016 1.4--1.8 2015 1.4--1.8 2014 1.3--1.5 2013 1.2--1.4 2012 1.0--1.5  MICRO Recent Results (from the past 240 hour(s))  SARS CORONAVIRUS 2 (TAT 6-24 HRS) Nasopharyngeal Nasopharyngeal Swab     Status: None   Collection Time: 03/14/20  3:40 PM   Specimen: Nasopharyngeal Swab  Result Value Ref  Range Status   SARS Coronavirus 2 NEGATIVE NEGATIVE Final    Comment: (NOTE) SARS-CoV-2 target nucleic acids are NOT DETECTED.  The SARS-CoV-2 RNA is generally detectable in upper and lower respiratory specimens during the acute phase of infection. Negative results do not preclude SARS-CoV-2 infection, do not rule out co-infections with other pathogens, and should not be used as the sole basis for treatment or other patient management decisions. Negative results must be combined with clinical observations, patient history, and epidemiological information. The expected result is Negative.  Fact Sheet for  Patients: SugarRoll.be  Fact Sheet for Healthcare Providers: https://www.woods-mathews.com/  This test is not yet approved or cleared by the [place] FDA and  has been authorized for detection and/or diagnosis of SARS-CoV-2 by FDA under an Emergency Use Authorization (EUA). This EUA will remain  in effect (meaning this test can be used) for the duration of the COVID-19 declaration under Se ction 564(b)(1) of the Act, 21 U.S.C. section 360bbb-3(b)(1), unless the authorization is terminated or revoked sooner.  Performed at [facility], [place] [address] [place], [place] [address]   SARS Coronavirus 2 by RT PCR (hospital order, performed in [facility] hospital lab) Nasopharyngeal Nasopharyngeal Swab     Status: None   Collection Time: 03/20/20  7:28 AM   Specimen: Nasopharyngeal Swab  Result Value Ref Range Status   SARS Coronavirus 2 NEGATIVE NEGATIVE Final    Comment: (NOTE) SARS-CoV-2 target nucleic acids are NOT DETECTED.  The SARS-CoV-2 RNA is generally detectable in upper and lower respiratory specimens during the acute phase of infection. The lowest concentration of SARS-CoV-2 viral copies this assay can detect is 250 copies / mL. A negative result does not preclude SARS-CoV-2 infection and should not be used as the sole basis for treatment or other patient management decisions.  A negative result may occur with improper specimen collection / handling, submission of specimen other than nasopharyngeal swab, presence of viral mutation(s) within the areas targeted by this assay, and inadequate number of viral copies (<250 copies / mL). A negative result must be combined with clinical observations, patient history, and epidemiological information.  Fact Sheet for Patients:   StrictlyIdeas.no  Fact Sheet for Healthcare Providers: BankingDealers.co.za  This test is not yet  approved or  cleared by the [place] FDA and has been authorized for detection and/or diagnosis of SARS-CoV-2 by FDA under an Emergency Use Authorization (EUA).  This EUA will remain in effect (meaning this test can be used) for the duration of the COVID-19 declaration under Section 564(b)(1) of the Act, 21 U.S.C. section 360bbb-3(b)(1), unless the authorization is terminated or revoked sooner.  Performed at [facility], [address] [place], [place] [address]       Lab Results  Component Value Date   CALCIUM 7.8 (L) 03/20/2020   CAION 1.06 (L) 07/02/2016   PHOS 4.5 04/07/2019        Chest x-ray done this-March 20, 2020  IMPRESSION: Diffuse interstitial opacities suggestive of pulmonary edema given cardiomegaly. Atypical infection could have a similar appearance. Recommend clinical correlation.  2D echo done on June 21st 2021 1. Left ventricular ejection fraction, by estimation, is 50 to 55%. The  left ventricle has low normal function. The left ventricle has no regional  wall motion abnormalities. There is mild left ventricular hypertrophy.  Left ventricular diastolic  parameters are consistent with Grade II diastolic dysfunction  (pseudonormalization).   Impression:    Patient is a 64 year old Caucasian male  with a past medical history of coronary artery disease, status post CABG in 2012, moderate aortic stenosis, carotid artery stenosis status post left-sided CEA, diabetes mellitus type 2 , CKD stage IV, hypertension, hyperlipidemia, PAD status post extensive lower extremity revascularization by surgery, obesity came to the emergency room with increasing exertional shortness of breath and chest pain.  Patient is currently admitted with Unstable angina Exertional shortness of breath CKD stage IV  Hypertension Anemia of chronic disease  . 1)Renal  Patient has CKD stage V Patient has CKD since 2013 Patient has CKD most likely secondary to  multiple factors Patient has CKD from diabetes mellitus Possible contribution from hypertension/history of multiple AKI/cardiorenal syndrome     2)HTN Blood pressure at goal   3)Anemia of chronic disease  HGb is not at goal (9--11) We will ask for anemia profile  4) secondary hyperparathyroidism -CKD Mineral-Bone Disorder   Secondary Hyperparathyroidism present Patient intact PTH level reviewed.patient was on the higher side-118 on August 19  Phosphorus at goal.   5) unstable angina Patient is being currently admitted with unstable angina Patient is on IV nitro/IV heparin Cardiology and primary team following closely  6) electrolytes   sodium Normonatremic   potassium Normokalemic    7)Acid base Co2 at goal  8) acute on chronic diastolic CHF Patient will benefit from diuretics Especially when last cath on July 26 was aborted secondary to fluid overload  Data reviewed from care everywhere Aborted right and left cardiac catheterization due to respiratory distress and volume overload   Plan:  We'll ask for anemia work-up.  We'll start patient diuretics.  I had extensive discussion the patient regarding his kidney related issues Educated patient about possible need for renal placement therapy. I did discuss different modalities of dialysis I did discuss different goals of access.Patient voiced understanding     Jsiah Menta s Theador Hawthorne 03/20/2020, 1:28 PM

## 2020-03-20 NOTE — Consult Note (Signed)
Cardiology Consultation:   Patient ID: [name] [name] MRN: [id number]; DOB: February 11, 1956  Admit date: 03/20/2020 Date of Consult: 03/20/2020  Primary Care Provider: [name], MD [facility] Cardiologist: [name], MD  Physician requesting consult: Dr. [name] Reason for consult: Unstable angina   Patient Profile:   [name] [name] is a 64 y.o. male with a hx of coronary artery disease, CABG 2012, moderate aortic valve stenosis, peripheral arterial disease with carotid stenosis, left carotid endarterectomy, diabetes type 2, morbid obesity, chronic kidney disease stage IV, anemia of chronic disease, extensive lower extremity revascularization by vascular surgery, smoker, morbid obesity presenting with unstable angina symptoms  History of Present Illness:   [name] was recently seen in the office March 11, 2020 with symptoms of exertional angina, fatigue, requiring nitroglycerin daily or every other day for anginal symptoms, described the symptoms as " an elephant sitting on his chest" Following that evaluation in the office he was scheduled for right and left heart catheterization knowing that he might need hemodialysis  6 PM yesterday he developed chest pain took 3 sublingual nitroglycerin 3:30 AM took 3 more nitro and aspirin As pain did not improve EMS contacted They gave him nitro spray, hypoxic with saturations in the 80s on arrival, placed on 2 L nasal cannula Given continued pain was started on nitro infusion with his heparin infusion  On my evaluation the emergency room he was pain-free, had ripped out all of his IVs so he could go to the bathroom, blood on his shirt and arms, IV still dripping on the ground With short of breath sitting up in chair   Review of notes indicates the catheterization at [facility] later this week was canceled secondary to worsening renal failure, anemia  scheduled with Dr. Delana Meyer for a permcath insertion on 03/23/20 with a 11:30  am   Prior cardiac history as below 2013 ischemic evaluation via stress testing no evidence of ischemia.   Echo in 12/2018 showed normal LV systolic function with moderate aortic stenosis with an aortic valve area 1.28 cm and a mean gradient of 20 mmHg and a mildly dilated aortic root at 4 cm.    10/2019  renal function continued to deteriorate and is followed by nephrology.   echo 12/2019 which demonstrated low normal EF of 50 to 55%, no regional wall motion abnormalities, mild LVH, grade 2 diastolic dysfunction, normal RV systolic function and RV cavity size, mildly dilated left atrium, mild to moderate mitral regurgitation, moderate aortic stenosis with a valve area of 1.06 cm, and a mean gradient of 26 mm.  ED on 02/08/2020 with right-sided flank pain that was worse with movement. CT renal stone protocol showed no acute intra-abdominal findings.   admitted to [facility] on 7/24 with a NSTEMI with HS-Tn peaking at 470.  R/LHC aborted given respiratory status     Past Medical History:  Diagnosis Date  . Adenomatous colon polyp   . Allergy   . Angina at rest [facility])    chronic  . Anxiety   . Arthritis    RA  . Bell's palsy 2007  . Carotid artery occlusion   . Cataract    Dr. Dawna Part  . CHF (congestive heart failure) ([place])   . Coronary artery disease   . Depression   . Diabetes mellitus   . Diverticulosis   . Duodenitis   . DVT (deep venous thrombosis) (HCC)    in leg  . Dyspnea    DOE  . Gastropathy   .  GERD (gastroesophageal reflux disease)   . Heart murmur   . Helicobacter pylori gastritis   . Hyperlipidemia   . Hypertension   . Kidney failure   . Mild aortic stenosis   . Neuropathy   . Obesity   . Peripheral vascular disease ([place])   . Post splenectomy syndrome   . Psoriasis   . Psoriatic arthritis ([place])   . Renal disorder    stage 4 kidney failure  . Sleep apnea    uses cpap  . SOB (shortness of breath)   . Stroke ([place])   . Tobacco use disorder    recently  quit  . Tubular adenoma 08/2013   Dr. Hilarie Fredrickson  . Weak urinary stream     Past Surgical History:  Procedure Laterality Date  . CARDIAC CATHETERIZATION  08/2010   LAD: 80% ISR, RCA: 80% ostial, OM 80-90%  . CAROTID ENDARTERECTOMY  08/2010   left/ Dr. Kellie Simmering  . CATARACT EXTRACTION W/PHACO Left 07/09/2017   Procedure: CATARACT EXTRACTION PHACO AND INTRAOCULAR LENS PLACEMENT (IOC);  Surgeon: Birder Robson, MD;  Location: [facility] ORS;  Service: Ophthalmology;  Laterality: Left;  [place] 00:23 AP% 14.2 CDE 3.26 Fluid pack lot # [id number]  . CATARACT EXTRACTION W/PHACO Right 09/10/2017   Procedure: CATARACT EXTRACTION PHACO AND INTRAOCULAR LENS PLACEMENT (IOC);  Surgeon: Birder Robson, MD;  Location: [facility] ORS;  Service: Ophthalmology;  Laterality: Right;  [place] 00:26.4 AP% 17.3 CDE 4.59 Fluid Pack Lot # [id number]  . COLONOSCOPY    . CORONARY ANGIOPLASTY     LAD: before CABG  . CORONARY ARTERY BYPASS GRAFT  09/06/2010   At [facility]: LIMA to LAD, left radial to RCA, sequential SVG to OM3 and 4  . heart stents  Jan 2011   leg stents 06/2009 and 03/2010  . LOWER EXTREMITY ANGIOGRAPHY Left 06/09/2019   Procedure: LOWER EXTREMITY ANGIOGRAPHY;  Surgeon: Katha Cabal, MD;  Location: [place] CV LAB;  Service: Cardiovascular;  Laterality: Left;  . PTA of illiac and SFA  multiple   Dr. Ronalee Belts, s/p revision 11/2012  . RIGHT/LEFT HEART CATH AND CORONARY/GRAFT ANGIOGRAPHY N/A 02/15/2020   Procedure: RIGHT/LEFT HEART CATH AND CORONARY/GRAFT ANGIOGRAPHY;  Surgeon: Wellington Hampshire, MD;  Location: [place] CV LAB;  Service: Cardiovascular;  Laterality: N/A;  . SPLENECTOMY    . VASCULAR SURGERY     LEG STENTS     Home Medications:  Prior to Admission medications   Medication Sig Start Date End Date Taking? Authorizing Provider  amLODipine (NORVASC) 2.5 MG tablet Take 1 tablet (2.5 mg total) by mouth daily. 02/19/20  Yes Sharen Hones, MD  aspirin 81 MG tablet Take 81 mg by mouth daily.   Yes  [provider]  busPIRone (BUSPAR) 10 MG tablet TAKE 2 TABLETS(20 MG) BY MOUTH TWICE DAILY Patient taking differently: Take 20 mg by mouth 2 (two) times daily.  02/19/20  Yes [name], MD  calcitRIOL (ROCALTROL) 0.25 MCG capsule Take 0.25 mcg by mouth every Monday, Wednesday, and Friday. Monday then Wednesday then Friday   Yes [provider]  carvedilol (COREG) 12.5 MG tablet Take 1 tablet (12.5 mg total) by mouth 2 (two) times daily. Patient taking differently: Take 12.5 mg by mouth daily.  12/10/19  Yes Wellington Hampshire, MD  clopidogrel (PLAVIX) 75 MG tablet Take 75 mg by mouth daily.   Yes [provider]  finasteride (PROSCAR) 5 MG tablet Take 5 mg by mouth daily.   Yes [provider]  isosorbide  mononitrate (IMDUR) 30 MG 24 hr tablet Take 3 tablets (90 mg total) by mouth daily. 02/23/20  Yes Dunn, Areta Haber, PA-C  sertraline (ZOLOFT) 100 MG tablet Take 1 tablet (100 mg total) by mouth daily. 02/23/20  Yes [name], MD  simvastatin (ZOCOR) 20 MG tablet TAKE 1 TABLET(20 MG) BY MOUTH DAILY AT 6 PM Patient taking differently: Take 20 mg by mouth every evening.  11/20/19  Yes Wellington Hampshire, MD  tamsulosin (FLOMAX) 0.4 MG CAPS capsule Take 1 capsule by mouth daily Patient taking differently: Take 0.4 mg by mouth daily.  01/20/20  Yes [name], MD  torsemide (DEMADEX) 20 MG tablet Take 2 tablets (40 mg total) by mouth daily. 02/24/20  Yes [name], MD  albuterol [facility] HFA) 108 (90 Base) MCG/ACT inhaler Inhale 2 puffs into the lungs every 6 (six) hours as needed for shortness of breath. 09/14/19   [name], MD  buPROPion (WELLBUTRIN SR) 150 MG 12 hr tablet Take 1 tablet (150 mg total) by mouth daily. Patient not taking: Reported on 03/20/2020 02/23/20   [name], MD  Dextran 70-Hypromellose (ARTIFICIAL TEARS) 0.1-0.3 % SOLN Place 1 drop into both eyes 4 (four) times daily as needed (dry eyes).    [provider]  nitroGLYCERIN (NITROSTAT) 0.4 MG SL tablet Place 1 tablet (0.4 mg total) under the tongue every 5 (five) minutes as needed. Maximum of 3 doses. Patient taking differently: Place 0.4 mg under the tongue every 5 (five) minutes as needed for chest pain. Maximum of 3 doses. 02/23/20   Dunn, Areta Haber, PA-C  predniSONE (DELTASONE) 50 MG tablet Take 1 tablet at 11 pm on night before procedure, take 1 tablet at 5 am the morning of procedure and take 1 tablet at 9:30 AM the morning of procedure 03/11/20   [name], PA-C    Inpatient Medications: Scheduled Meds: . amLODipine  2.5 mg Oral Daily  . aspirin EC  81 mg Oral Daily  . busPIRone  20 mg Oral BID  . [START ON 03/21/2020] calcitRIOL  0.25 mcg Oral Q M,W,F  . carvedilol  12.5 mg Oral Daily  . finasteride  5 mg Oral Daily  . furosemide  40 mg Oral BID  . insulin aspart  0-5 Units Subcutaneous QHS  . insulin aspart  0-9 Units Subcutaneous TID WC  . simvastatin  20 mg Oral QPM  . tamsulosin  0.4 mg Oral Daily   Continuous Infusions: . heparin 1,100 Units/hr (03/20/20 1139)  . nitroGLYCERIN 20 mcg/min (03/20/20 1139)   PRN Meds: acetaminophen **OR** acetaminophen, morphine injection, ondansetron **OR** ondansetron (ZOFRAN) IV, polyethylene glycol  Allergies:    Allergies  Allergen Reactions  . Contrast Media [Iodinated Diagnostic Agents] Rash and Other (See Comments)    Got very hot and red   . Glipizide Other (See Comments)    ANTIDIABETICS. Burning  . Hydroxychloroquine Other (See Comments)    Stomach upset  . Metrizamide Other (See Comments)    Got very hot and red  . Penicillins Hives and Swelling    Has patient had a PCN reaction causing immediate rash, facial/tongue/throat swelling, SOB or lightheadedness with hypotension: yes Has patient had a PCN reaction causing severe rash involving mucus membranes or skin necrosis: no  Has patient had a PCN reaction that required hospitalization: yes Has patient had a PCN reaction  occurring within the last 10 years: no If all of the above answers are "NO", then may  proceed with Cephalosporin use.     Social History:   Social History   Socioeconomic History  . Marital status: Widowed    Spouse name: Not on file  . Number of children: 2  . Years of education: Not on file  . Highest education level: Not on file  Occupational History  . Occupation: Chief Operating Officer at [employer]: Disabled mostly due to neuropathy  Tobacco Use  . Smoking status: Former Smoker    Packs/day: 0.75    Years: 40.00    Pack years: 30.00    Types: Cigarettes    Quit date: 04/15/2017    Years since quitting: 2.9  . Smokeless tobacco: Never Used  . Tobacco comment: 1 cigarette a day  Vaping Use  . Vaping Use: Never used  Substance and Sexual Activity  . Alcohol use: No  . Drug use: No  . Sexual activity: Never  Other Topics Concern  . Not on file  Social History Narrative   Wife died January 24, 2019      Has living will   Gearldine Shown and his wife Levada Dy should be his health care POA   Would accept resuscitation --but no prolonged ventilation   No tube feeds if cognitively unaware   Social Determinants of Health   Financial Resource Strain: Low Risk   . Difficulty of Paying Living Expenses: Not very hard  Food Insecurity: No Food Insecurity  . Worried About Charity fundraiser in the Last Year: Never true  . Ran Out of Food in the Last Year: Never true  Transportation Needs: No Transportation Needs  . Lack of Transportation (Medical): No  . Lack of Transportation (Non-Medical): No  Physical Activity:   . Days of Exercise per Week: Not on file  . Minutes of Exercise per Session: Not on file  Stress: No Stress Concern Present  . Feeling of Stress : Only a little  Social Connections: Unknown  . Frequency of Communication with Friends and Family: More than three times a week  . Frequency of Social Gatherings with Friends and Family: Not on file  . Attends Religious  Services: Not on file  . Active Member of Clubs or Organizations: Not on file  . Attends Archivist Meetings: Not on file  . Marital Status: Not on file  Intimate Partner Violence: Unknown  . Fear of Current or Ex-Partner: No  . Emotionally Abused: No  . Physically Abused: No  . Sexually Abused: Not on file    Family History:    Family History  Problem Relation Age of Onset  . Lung cancer Father 19  . Arthritis Father   . Breast cancer Sister 76  . Alcoholism Sister   . Dementia Mother   . Alzheimer's disease Mother   . Arthritis Mother   . Alcoholism Brother   . Epilepsy Sister   . Diabetes Paternal Grandfather   . Colon polyps Paternal Grandfather 43  . Alcoholism Sister   . Alcoholism Sister   . Alcoholism Brother      ROS:  Please see the history of present illness.  Review of Systems  Constitutional: Negative.   HENT: Negative.   Respiratory: Negative.   Cardiovascular: Positive for chest pain.  Gastrointestinal: Negative.   Musculoskeletal: Negative.   Neurological: Negative.   Psychiatric/Behavioral: Negative.   All other systems reviewed and are negative.    Physical Exam/Data:   Vitals:   03/20/20 1111 03/20/20 1115 03/20/20 1215 03/20/20 1238  BP:   132/87 (!) 148/84  Pulse: (!) 101   (!) 105  Resp: 16 19 (!) 22 19  Temp:    98.3 F (36.8 C)  TempSrc:    Oral  SpO2: 96%   98%  Weight:    106 kg  Height:    5\' 6"  (1.676 m)    Intake/Output Summary (Last 24 hours) at 03/20/2020 1405 Last data filed at 03/20/2020 1139 Gross per 24 hour  Intake 32.83 ml  Output --  Net 32.83 ml   Last 3 Weights 03/20/2020 03/20/2020 03/11/2020  Weight (lbs) 233 lb 11.2 oz 225 lb 229 lb 12.8 oz  Weight (kg) 106.006 kg 102.059 kg 104.237 kg  Some encounter information is confidential and restricted. Go to Review Flowsheets activity to see all data.     Body mass index is 37.72 kg/m.  General: Morbidly obese, no distress HEENT: normal Lymph: no  adenopathy Neck: Unable to estimate JVD Endocrine:  No thryomegaly Vascular: No carotid bruits; FA pulses 2+ bilaterally without bruits  Cardiac:  normal S1, S2; RRR; no murmur  Lungs: Scattered Rales Abd: soft, nontender, no hepatomegaly  Ext: no edema Musculoskeletal:  No deformities, BUE and BLE strength normal and equal Skin: warm and dry  Neuro:  CNs 2-12 intact, no focal abnormalities noted Psych:  Normal affect   EKG:  The EKG was personally reviewed and demonstrates:   Shows atrial flutter ventricular rate 96 bpm (notching of P waves noted best in V1, lead II)  Telemetry:  Telemetry was personally reviewed and demonstrates: Atrial A. fib /flutter  Relevant CV Studies: Echocardiogram . Left ventricular ejection fraction, by estimation, is 50 to 55%. The  left ventricle has low normal function. The left ventricle has no regional  wall motion abnormalities. There is mild left ventricular hypertrophy.  Left ventricular diastolic  parameters are consistent with Grade II diastolic dysfunction  (pseudonormalization).  2. Right ventricular systolic function is normal. The right ventricular  size is normal. There is moderately elevated pulmonary artery systolic  pressure.  3. Left atrial size was mildly dilated.  4. The mitral valve is abnormal. Mild to moderate mitral valve  regurgitation. No evidence of mitral stenosis.  5. The aortic valve is abnormal. Aortic valve regurgitation is not  visualized. Moderate aortic valve stenosis. Aortic valve area, by VTI  measures 1.06 cm. Aortic valve mean gradient measures 26.0 mmHg.  6. Aortic dilatation noted. There is mild dilatation of the ascending  aorta measuring 41 mm.  7. The inferior vena cava is normal in size with <50% respiratory  variability, suggesting right atrial pressure of 8 mmHg.  Laboratory Data:  High Sensitivity Troponin:   Recent Labs  Lab 03/11/20 1134 03/20/20 0620 03/20/20 0827 03/20/20 1255    TROPONINIHS 15 19* 51* 86*     Chemistry Recent Labs  Lab 03/15/20 0834 03/20/20 0620 03/20/20 0919  NA 138 138  --   K 4.6 3.8  --   CL 100 105  --   CO2 26 20*  --   GLUCOSE 165* 178*  --   BUN 52* 63*  --   CREATININE 4.32* 4.21* 4.58*  CALCIUM 8.7* 7.8*  --   GFRNONAA 13* 14* 12*  GFRAA 15* 16* 14*  ANIONGAP 12 13  --     No results for input(s): PROT, ALBUMIN, AST, ALT, ALKPHOS, BILITOT in the last 168 hours. Hematology Recent Labs  Lab 03/15/20 0834 03/20/20 0620  WBC 6.2 6.6  RBC 2.77*  2.49*  HGB 8.8* 8.0*  HCT 28.0* 24.3*  MCV 101.1* 97.6  MCH 31.8 32.1  MCHC 31.4 32.9  RDW 14.0 14.3  PLT 131* 125*   BNP Recent Labs  Lab 03/20/20 0620  BNP 503.2*    DDimer No results for input(s): DDIMER in the last 168 hours.   Radiology/Studies:  DG Chest Portable 1 View  Result Date: 03/20/2020 CLINICAL DATA:  Chest pain. EXAM: PORTABLE CHEST 1 VIEW COMPARISON:  February 15, 2020 FINDINGS: No pneumothorax. Stable cardiomegaly. Diffuse interstitial opacities are identified. No nodules or masses. No focal infiltrates. IMPRESSION: Diffuse interstitial opacities suggestive of pulmonary edema given cardiomegaly. Atypical infection could have a similar appearance. Recommend clinical correlation. Electronically Signed   By: Dorise Bullion III M.D   On: 03/20/2020 08:08     Assessment and Plan:   1.  Unstable angina Known coronary artery disease, prior bypass Recently seen in clinic, scheduled for cardiac catheterization this week though was canceled secondary to worsening renal failure and anemia --Presenting with unstable angina symptoms, requiring frequent use of nitro, now on nitro infusion --We have placed him on the schedule for right and left heart catheterization tomorrow -Discussed with Dr. Fletcher Anon I have reviewed the risks, indications, and alternatives to cardiac catheterization, possible angioplasty, and stenting with the patient. Risks include but are not  limited to bleeding, infection, vascular injury, stroke, myocardial infection, arrhythmia, kidney injury, radiation-related injury in the case of prolonged fluoroscopy use, emergency cardiac surgery, and death. The patient understands the risks of serious complication is 1-2 in 1829 with diagnostic cardiac cath and 1-2% or less with angioplasty/stenting.  Notes indicating contrast allergy, will premedicate with prednisone --We will continue nitro infusion for now with heparin infusion -Continue aspirin Plavix  2.  PAD Carotid disease, known lower extremity arterial disease Stressed importance of aggressive diabetes control, lifestyle modification, aspirin, statin  3.  Acute on chronic renal failure Likely secondary to longstanding poorly controlled diabetes Followed by nephrology, Scheduled later this week for permacath insertion with Dr. Ronalee Belts  4.  Anemia Likely multifactorial, in the setting of renal failure -Nephrology following -Consider transfusion, iron studies, guaiacs  5.  Diabetes type 2 with complications Most recent hemoglobin A1c 5.7 in July 2021 Lifestyle modification recommended   Total encounter time more than 110 minutes  Greater than 50% was spent in counseling and coordination of care with the patient   For questions or updates, please contact [place] Please consult [email or web address] for contact info under    Signed, Ida Rogue, MD  03/20/2020 2:05 PM

## 2020-03-20 NOTE — ED Triage Notes (Signed)
Pt from home to ED via [facility] for CP. Pt st his CP started 03/19/20 around 1800. Pt took nitro 3 nitro SL at appropriate interval up until 1900 and then at 03:30 he took 3 more nitro SL (6 SL nitro in total)  & 324 of ASA [prior to EMS arrival at home]. Pt still c/o 9/10 pain during self medicating. Pt pmhx includes a heart block and "baby strokes" (pt unsure of what degree); and DM. Pt had his first heart attack in July of 2021. Pt is scheduled for a cardiac cath this Wednesday 03/23/2020. Per report pt was told during his previous hospitalization that he has CKD and will tentatively need to be placed on dialysis.   Pt was given 1 Nitro Spray via EMS; upon arrival his O2 sat's were 80% -> pt placed on 2LNC stats 100%. EKG unremarkable.

## 2020-03-20 NOTE — Plan of Care (Signed)
Patient admitted to the floor for chest pain, alert and oriented, denies any pain at this time, patient on nitro drip at 58mcg/kg vss within normal limit, heparin drip also infusing at 11 ml /Hr, no chest pain or discomfort at this time  Problem: Education: Goal: Understanding of cardiac disease, CV risk reduction, and recovery process will improve Outcome: Progressing   Problem: Activity: Goal: Ability to tolerate increased activity will improve Outcome: Progressing   Problem: Cardiac: Goal: Ability to achieve and maintain adequate cardiovascular perfusion will improve Outcome: Progressing

## 2020-03-20 NOTE — Consult Note (Addendum)
ANTICOAGULATION CONSULT NOTE - Initial Consult  Pharmacy Consult for heparin dosing Indication: chest pain/ACS  Allergies  Allergen Reactions  . Contrast Media [Iodinated Diagnostic Agents] Rash and Other (See Comments)    Got very hot and red   . Glipizide Other (See Comments)    ANTIDIABETICS. Burning  . Metrizamide Other (See Comments)    Got very hot and red  . Penicillins Hives and Swelling    Has patient had a PCN reaction causing immediate rash, facial/tongue/throat swelling, SOB or lightheadedness with hypotension: yes Has patient had a PCN reaction causing severe rash involving mucus membranes or skin necrosis: no  Has patient had a PCN reaction that required hospitalization: yes Has patient had a PCN reaction occurring within the last 10 years: no If all of the above answers are "NO", then may proceed with Cephalosporin use.   [name] Hydroxychloroquine Other (See Comments)    Stomach upset    Patient Measurements: Height: 5\' 6"  (167.6 cm) Weight: 102.1 kg (225 lb) IBW/kg (Calculated) : 63.8 Heparin Dosing Weight: 86.4  Vital Signs: Temp: 97.7 F (36.5 C) (08/29 0625) Temp Source: Oral (08/29 0625) BP: 135/64 (08/29 0830) Pulse Rate: 93 (08/29 0830)  Labs: Recent Labs    03/20/20 0620  HGB 8.0*  HCT 24.3*  PLT 125*  APTT 34  LABPROT 14.0  INR 1.1  CREATININE 4.21*  TROPONINIHS 19*    Estimated Creatinine Clearance: 19.3 mL/min (A) (by C-G formula based on SCr of 4.21 mg/dL (H)).   Medical History: Past Medical History:  Diagnosis Date  . Adenomatous colon polyp   . Allergy   . Angina at rest [facility])    chronic  . Anxiety   . Arthritis    RA  . Bell's palsy 2007  . Carotid artery occlusion   . Cataract    Dr. Dawna Part  . CHF (congestive heart failure) ([place])   . Coronary artery disease   . Depression   . Diabetes mellitus   . Diverticulosis   . Duodenitis   . DVT (deep venous thrombosis) ([facility])    in leg  . Dyspnea    DOE  . Gastropathy   .  GERD (gastroesophageal reflux disease)   . Heart murmur   . Helicobacter pylori gastritis   . Hyperlipidemia   . Hypertension   . Kidney failure   . Mild aortic stenosis   . Neuropathy   . Obesity   . Peripheral vascular disease ([place])   . Post splenectomy syndrome   . Psoriasis   . Psoriatic arthritis ([place])   . Renal disorder    stage 4 kidney failure  . Sleep apnea    uses cpap  . SOB (shortness of breath)   . Stroke ([place])   . Tobacco use disorder    recently quit  . Tubular adenoma 08/2013   Dr. Hilarie Fredrickson  . Weak urinary stream     Medications:  Scheduled:  . heparin  5,000 Units Subcutaneous Q8H    Assessment: 63 year old male presenting with exertional shortness of breath and chest pain. PMH includes CAD s/p CABG (2012), diabetes, CKD stage IV, HTN, HLD, PAD, and psoriasis. Pt will not receive heparin bolus per MD. Hgb 10.1 (02/23/20) >8.8>8.0. Plt 173 (02/23/20) >131>125.   Goal of Therapy:  Heparin level 0.3-0.7 units/ml Monitor platelets by anticoagulation protocol: Yes   Plan:  Start heparin infusion at 1100 units/hr Check HL 8 hours after start of infusion  Continue to monitor H&H and platelets  Benn Moulder, PharmD Pharmacy Resident  03/20/2020 9:37 AM

## 2020-03-20 NOTE — ED Provider Notes (Signed)
[facility] Emergency Department Provider Note  ____________________________________________   First MD Initiated Contact with Patient 03/20/20 [phone]     (approximate)  I have reviewed the triage vital signs and the nursing notes.   HISTORY  Chief Complaint Chest Pain   HPI [name] [name] is a 64 y.o. male  with history of CAD status post CABG in 2012, moderate aortic stenosis, carotid artery disease status post left-sided CEA, DM2, CKD stage IV, anemia of chronic disease, HTN, HLD, PAD status post extensive lower extremity revascularization by vascular surgery in 05/2019, remote tobacco use, obesity, and psoriasis and recent hospitalization for volume overload presents for assessment of acute on chronic chest pain. Patient states he has intermittent chest pain with exertion and this has been present for at least several weeks but that yesterday and over the course of last night he feels it is gotten worse and has been persistent even with sublingual nitroglycerin. He states it is currently 10/10 intensity. States it is crushing. It does not radiate. He endorses a chronic cough but denies any hemoptysis, shortness of breath, headache, earache, sore throat, vomiting, diarrhea, dysuria, dental pain, back pain, extremity pain, rash, or other acute complaints. States he has not smoked in over a year denies EtOH use no drug use.   Past Medical History:  Diagnosis Date  . Adenomatous colon polyp   . Allergy   . Angina at rest [facility])    chronic  . Anxiety   . Arthritis    RA  . Bell's palsy 2007  . Carotid artery occlusion   . Cataract    Dr. Dawna Part  . CHF (congestive heart failure) ([place])   . Coronary artery disease   . Depression   . Diabetes mellitus   . Diverticulosis   . Duodenitis   . DVT (deep venous thrombosis) (HCC)    in leg  . Dyspnea    DOE  . Gastropathy   . GERD (gastroesophageal reflux disease)   . Heart murmur   . Helicobacter pylori  gastritis   . Hyperlipidemia   . Hypertension   . Kidney failure   . Mild aortic stenosis   . Neuropathy   . Obesity   . Peripheral vascular disease ([place])   . Post splenectomy syndrome   . Psoriasis   . Psoriatic arthritis ([place])   . Renal disorder    stage 4 kidney failure  . Sleep apnea    uses cpap  . SOB (shortness of breath)   . Stroke ([place])   . Tobacco use disorder    recently quit  . Tubular adenoma 08/2013   Dr. Hilarie Fredrickson  . Weak urinary stream     Patient Active Problem List   Diagnosis Date Noted  . Unstable angina ([place]) 03/20/2020  . Chronic kidney disease   . NSTEMI (non-ST elevated myocardial infarction) ([place]) 02/13/2020  . Moderate aortic valve stenosis 02/13/2020  . Acute diastolic CHF (congestive heart failure) ([place]) 02/13/2020  . Hypertensive urgency 02/13/2020  . Fall with injury 11/16/2019  . Leg weakness, bilateral 11/16/2019  . Benign hypertensive kidney disease with chronic kidney disease 09/02/2019  . Proteinuria 09/02/2019  . Secondary hyperparathyroidism of renal origin ([place]) 09/02/2019  . Left arm pain 02/11/2019  . Gastroesophageal reflux 03/25/2018  . Atherosclerosis of aorta ([place]) 09/04/2017  . Thoracic aortic aneurysm ([place]) 09/04/2017  . Right thyroid nodule 04/12/2017  . Thrombocytopenia ([place]) 03/16/2017  . CKD stage 4 due to type 2 diabetes mellitus ([place])  03/15/2017  . Advance directive discussed with patient 03/15/2017  . BMI 40.0-44.9, adult ([place]) 09/12/2016  . Psoriatic arthritis ([place])   . Lung nodule < 6cm on CT 03/29/2016  . COPD with acute bronchitis ([place]) 03/29/2016  . Headache 09/14/2015  . Constipation 10/05/2014  . PVD (peripheral vascular disease) ([place]) 04/23/2014  . Memory loss 04/23/2014  . Aortic valve disorder 01/11/2014  . Preventative health care 11/23/2013  . Benign prostatic hypertrophy without urinary obstruction 03/25/2013  . Enlarged prostate without lower urinary tract symptoms (luts) 03/25/2013  . Peripheral  vascular disease due to secondary diabetes mellitus ([place]) 03/25/2013  . Obesity 02/12/2013  . Atherosclerotic heart disease of native coronary artery with angina pectoris ([place]) 06/06/2012  . Nonrheumatic aortic valve stenosis   . Low back pain 12/27/2011  . MDD (major depressive disorder), recurrent episode ([place]) 05/28/2011  . Carotid artery disease ([place]) 04/17/2011  . Occlusion and stenosis of carotid artery 04/17/2011  . Obstructive sleep apnea 03/05/2011  . Psoriasis 03/05/2011  . Neuropathy ([place]) 03/05/2011  . Type 2 diabetes mellitus with diabetic peripheral angiopathy without gangrene ([place]) 03/05/2011  . Polyneuropathy 03/05/2011  . Coronary artery disease   . Hyperlipidemia, mixed   . Essential hypertension     Past Surgical History:  Procedure Laterality Date  . CARDIAC CATHETERIZATION  08/2010   LAD: 80% ISR, RCA: 80% ostial, OM 80-90%  . CAROTID ENDARTERECTOMY  08/2010   left/ Dr. Kellie Simmering  . CATARACT EXTRACTION W/PHACO Left 07/09/2017   Procedure: CATARACT EXTRACTION PHACO AND INTRAOCULAR LENS PLACEMENT (IOC);  Surgeon: Birder Robson, MD;  Location: [facility] ORS;  Service: Ophthalmology;  Laterality: Left;  [place] 00:23 AP% 14.2 CDE 3.26 Fluid pack lot # [id number]  . CATARACT EXTRACTION W/PHACO Right 09/10/2017   Procedure: CATARACT EXTRACTION PHACO AND INTRAOCULAR LENS PLACEMENT (IOC);  Surgeon: Birder Robson, MD;  Location: [facility] ORS;  Service: Ophthalmology;  Laterality: Right;  [place] 00:26.4 AP% 17.3 CDE 4.59 Fluid Pack Lot # [id number]  . COLONOSCOPY    . CORONARY ANGIOPLASTY     LAD: before CABG  . CORONARY ARTERY BYPASS GRAFT  09/06/2010   At [facility]: LIMA to LAD, left radial to RCA, sequential SVG to OM3 and 4  . heart stents  Jan 2011   leg stents 06/2009 and 03/2010  . LOWER EXTREMITY ANGIOGRAPHY Left 06/09/2019   Procedure: LOWER EXTREMITY ANGIOGRAPHY;  Surgeon: Katha Cabal, MD;  Location: [facility];  Service: Cardiovascular;  Laterality: Left;  .  PTA of illiac and SFA  multiple   Dr. Ronalee Belts, s/p revision 11/2012  . RIGHT/LEFT HEART CATH AND CORONARY/GRAFT ANGIOGRAPHY N/A 02/15/2020   Procedure: RIGHT/LEFT HEART CATH AND CORONARY/GRAFT ANGIOGRAPHY;  Surgeon: Wellington Hampshire, MD;  Location: [facility];  Service: Cardiovascular;  Laterality: N/A;  . SPLENECTOMY    . VASCULAR SURGERY     LEG STENTS    Prior to Admission medications   Medication Sig Start Date End Date Taking? Authorizing Provider  albuterol (PROAIR HFA) 108 (90 Base) MCG/ACT inhaler Inhale 2 puffs into the lungs every 6 (six) hours as needed for shortness of breath. 09/14/19   [name], MD  amLODipine (NORVASC) 2.5 MG tablet Take 1 tablet (2.5 mg total) by mouth daily. 02/19/20   Sharen Hones, MD  aspirin 81 MG tablet Take 81 mg by mouth daily.    [provider]  buPROPion (WELLBUTRIN SR) 150 MG 12 hr tablet Take 1 tablet (150 mg total) by mouth daily. 02/23/20  [name], MD  busPIRone (BUSPAR) 10 MG tablet TAKE 2 TABLETS(20 MG) BY MOUTH TWICE DAILY Patient taking differently: Take 20 mg by mouth 2 (two) times daily.  02/19/20   [name], MD  calcitRIOL (ROCALTROL) 0.25 MCG capsule Take 0.25 mcg by mouth every Monday, Wednesday, and Friday. Monday then Wednesday then Friday    [provider]  carvedilol (COREG) 12.5 MG tablet Take 1 tablet (12.5 mg total) by mouth 2 (two) times daily. Patient taking differently: Take 12.5 mg by mouth daily.  12/10/19   Wellington Hampshire, MD  clopidogrel (PLAVIX) 75 MG tablet Take 75 mg by mouth daily.    [provider]  cyclobenzaprine (FLEXERIL) 10 MG tablet Take 10 mg by mouth 3 (three) times daily as needed for muscle spasms.  12/06/19   [provider]  Dextran 70-Hypromellose (ARTIFICIAL TEARS) 0.1-0.3 % SOLN Place 1 drop into both eyes 4 (four) times daily as needed (dry eyes).    [provider]  finasteride (PROSCAR) 5 MG tablet Take 5 mg by mouth  daily.    [provider]  isosorbide mononitrate (IMDUR) 30 MG 24 hr tablet Take 3 tablets (90 mg total) by mouth daily. 02/23/20   Dunn, Areta Haber, PA-C  nitroGLYCERIN (NITROSTAT) 0.4 MG SL tablet Place 1 tablet (0.4 mg total) under the tongue every 5 (five) minutes as needed. Maximum of 3 doses. Patient taking differently: Place 0.4 mg under the tongue every 5 (five) minutes as needed for chest pain. Maximum of 3 doses. 02/23/20   Dunn, Areta Haber, PA-C  oxyCODONE-acetaminophen (PERCOCET) 5-325 MG tablet Take 1 tablet by mouth every 4 (four) hours as needed for severe pain. 02/08/20   Harvest Dark, MD  predniSONE (DELTASONE) 50 MG tablet Take 1 tablet at 11 pm on night before procedure, take 1 tablet at 5 am the morning of procedure and take 1 tablet at 9:30 AM the morning of procedure 03/11/20   [name], PA-C  sertraline (ZOLOFT) 100 MG tablet Take 1 tablet (100 mg total) by mouth daily. 02/23/20   [name], MD  simvastatin (ZOCOR) 20 MG tablet TAKE 1 TABLET(20 MG) BY MOUTH DAILY AT 6 PM Patient taking differently: Take 20 mg by mouth every evening.  11/20/19   Wellington Hampshire, MD  tamsulosin (FLOMAX) 0.4 MG CAPS capsule Take 1 capsule by mouth daily Patient taking differently: Take 0.4 mg by mouth daily.  01/20/20   [name], MD  torsemide (DEMADEX) 20 MG tablet Take 2 tablets (40 mg total) by mouth daily. 02/24/20   [name], MD    Allergies Contrast media [iodinated diagnostic agents], Glipizide, Metrizamide, Penicillins, and Hydroxychloroquine  Family History  Problem Relation Age of Onset  . Lung cancer Father 34  . Arthritis Father   . Breast cancer Sister 41  . Alcoholism Sister   . Dementia Mother   . Alzheimer's disease Mother   . Arthritis Mother   . Alcoholism Brother   . Epilepsy Sister   . Diabetes Paternal Grandfather   . Colon polyps Paternal Grandfather 31  . Alcoholism Sister   . Alcoholism Sister   . Alcoholism Brother     Social  History Social History   Tobacco Use  . Smoking status: Former Smoker    Packs/day: 0.75    Years: 40.00    Pack years: 30.00    Types: Cigarettes    Quit date: 04/15/2017    Years since quitting:  2.9  . Smokeless tobacco: Never Used  . Tobacco comment: 1 cigarette a day  Vaping Use  . Vaping Use: Never used  Substance Use Topics  . Alcohol use: No  . Drug use: No    Review of Systems  Review of Systems  Constitutional: Negative for chills and fever.  HENT: Negative for sore throat.   Eyes: Negative for pain.  Respiratory: Positive for cough ( chronic). Negative for stridor.   Cardiovascular: Positive for chest pain.  Gastrointestinal: Negative for vomiting.  Skin: Negative for rash.  Neurological: Negative for seizures, loss of consciousness and headaches.  Psychiatric/Behavioral: Negative for suicidal ideas.  All other systems reviewed and are negative.     ____________________________________________   PHYSICAL EXAM:  VITAL SIGNS: ED Triage Vitals  Enc Vitals Group     BP 03/20/20 0625 134/81     Pulse Rate 03/20/20 0625 95     Resp 03/20/20 0625 16     Temp 03/20/20 0625 97.7 F (36.5 C)     Temp Source 03/20/20 0625 Oral     SpO2 03/20/20 0621 100 %     Weight 03/20/20 0626 225 lb (102.1 kg)     Height 03/20/20 0626 5\' 6"  (1.676 m)     Head Circumference --      Peak Flow --      Pain Score 03/20/20 0625 10     Pain Loc --      Pain Edu? --      Excl. in Progress? --    Vitals:   03/20/20 0800 03/20/20 0830  BP: 140/67 135/64  Pulse: 96 93  Resp: 12   Temp:    SpO2: 98% 96%   Physical Exam Vitals and nursing note reviewed.  Constitutional:      Appearance: He is well-developed. He is obese.  HENT:     Head: Normocephalic and atraumatic.     Right Ear: External ear normal.     Left Ear: External ear normal.     Nose: Nose normal.  Eyes:     Conjunctiva/sclera: Conjunctivae normal.  Cardiovascular:     Rate and Rhythm: Normal rate and  regular rhythm.     Heart sounds: Murmur heard.  Systolic murmur is present.   Pulmonary:     Effort: Pulmonary effort is normal. No respiratory distress.     Breath sounds: Normal breath sounds.  Abdominal:     Palpations: Abdomen is soft.     Tenderness: There is no abdominal tenderness.  Musculoskeletal:     Cervical back: Neck supple.  Skin:    General: Skin is warm and dry.  Neurological:     Mental Status: He is alert and oriented to person, place, and time.  Psychiatric:        Mood and Affect: Mood normal.      ____________________________________________   LABS (all labs ordered are listed, but only abnormal results are displayed)  Labs Reviewed  CBC WITH DIFFERENTIAL/PLATELET - Abnormal; Notable for the following components:      Result Value   RBC 2.49 (*)    Hemoglobin 8.0 (*)    HCT 24.3 (*)    Platelets 125 (*)    All other components within normal limits  BASIC METABOLIC PANEL - Abnormal; Notable for the following components:   CO2 20 (*)    Glucose, Bld 178 (*)    BUN 63 (*)    Creatinine, Ser 4.21 (*)    Calcium 7.8 (*)  GFR calc non Af Amer 14 (*)    GFR calc Af Amer 16 (*)    All other components within normal limits  BRAIN NATRIURETIC PEPTIDE - Abnormal; Notable for the following components:   B Natriuretic Peptide 503.2 (*)    All other components within normal limits  TROPONIN I (HIGH SENSITIVITY) - Abnormal; Notable for the following components:   Troponin I (High Sensitivity) 19 (*)    All other components within normal limits  SARS CORONAVIRUS 2 BY RT PCR (HOSPITAL ORDER, [facility])  PROTIME-INR  APTT  MAGNESIUM  CREATININE, SERUM  TROPONIN I (HIGH SENSITIVITY)  TROPONIN I (HIGH SENSITIVITY)   ____________________________________________  EKG  Sinus rhythm with a ventricular rate of 96, left bundle branch block, prolonged QTc interval at 501, and slightly worse ST depressions in lead V5 and V6 when  compared to prior with otherwise chronic inverted T waves in the inferior leads. No other significant underlying arrhythmia or clear evidence of acute scan. ____________________________________________  RADIOLOGY  ED MD interpretation: Cardiomegaly and very mild bilateral pulmonary congestion without focal consolidation, pneumothorax, edema, or other acute thoracic process.  Official radiology report(s): DG Chest Portable 1 View  Result Date: 03/20/2020 CLINICAL DATA:  Chest pain. EXAM: PORTABLE CHEST 1 VIEW COMPARISON:  February 15, 2020 FINDINGS: No pneumothorax. Stable cardiomegaly. Diffuse interstitial opacities are identified. No nodules or masses. No focal infiltrates. IMPRESSION: Diffuse interstitial opacities suggestive of pulmonary edema given cardiomegaly. Atypical infection could have a similar appearance. Recommend clinical correlation. Electronically Signed   By: Dorise Bullion III M.D   On: 03/20/2020 08:08    ____________________________________________   PROCEDURES  Procedure(s) performed (including Critical Care):  .1-3 Lead EKG Interpretation Performed by: Lucrezia Starch, MD Authorized by: Lucrezia Starch, MD     Interpretation: normal     ECG rate assessment: normal     Rhythm: sinus rhythm     Ectopy: none     Conduction: abnormal   .Critical Care Performed by: Lucrezia Starch, MD Authorized by: Lucrezia Starch, MD   Critical care provider statement:    Critical care time (minutes):  45   Critical care time was exclusive of:  Separately billable procedures and treating other patients   Critical care was necessary to treat or prevent imminent or life-threatening deterioration of the following conditions:  Cardiac failure   Critical care was time spent personally by me on the following activities:  Discussions with consultants, evaluation of patient's response to treatment, examination of patient, ordering and performing treatments and interventions, ordering  and review of laboratory studies, ordering and review of radiographic studies, pulse oximetry, re-evaluation of patient's condition, obtaining history from patient or surrogate and review of old charts     ____________________________________________   INITIAL IMPRESSION / [place] / ED COURSE        Overall patient's history, exam, and initial ED work-up is concerning for unstable angina given chest pain associated with some new ST changes on above-noted EKG.  Patient's troponin is noted to be just above reference range is significantly down from recent hospitalization where it was in triple digits.  In addition patient does have CKD 4 and is not unexpected for him to have troponin slightly above reference range.  Symptoms, exam, and work-up are not consistent with pneumonia, dissection, or significant volume overload.  In addition I have low suspicion for PE at this time.  Patient is known to be anemic but this  is close to his baseline consistent with history of chronic anemia.  Patient did take 324mg  mg aspirins prior to arrival.  I did discuss patient's presentation with on-call cardiologist Dr. Rockey Situ who recommended admission for possible cath.  Patient placed on nitro drip given ongoing chest pain.  We will plan to trend troponins.  Medications  nitroGLYCERIN 50 mg in dextrose 5 % 250 mL (0.2 mg/mL) infusion (15 mcg/min Intravenous Rate/Dose Verify 03/20/20 0909)  heparin injection 5,000 Units (has no administration in time range)  acetaminophen (TYLENOL) tablet 650 mg (has no administration in time range)    Or  acetaminophen (TYLENOL) suppository 650 mg (has no administration in time range)  morphine 2 MG/ML injection 0.5 mg (has no administration in time range)  polyethylene glycol (MIRALAX / GLYCOLAX) packet 17 g (has no administration in time range)  ondansetron (ZOFRAN) tablet 4 mg (has no administration in time range)    Or  ondansetron (ZOFRAN) injection 4 mg (has no  administration in time range)           ____________________________________________   FINAL CLINICAL IMPRESSION(S) / ED DIAGNOSES  Final diagnoses:  Unstable angina (HCC)  Abnormal ECG  QT prolongation  Stage 4 chronic kidney disease (HCC)  Low hemoglobin    Medications  nitroGLYCERIN 50 mg in dextrose 5 % 250 mL (0.2 mg/mL) infusion (15 mcg/min Intravenous Rate/Dose Verify 03/20/20 0909)  heparin injection 5,000 Units (has no administration in time range)  acetaminophen (TYLENOL) tablet 650 mg (has no administration in time range)    Or  acetaminophen (TYLENOL) suppository 650 mg (has no administration in time range)  morphine 2 MG/ML injection 0.5 mg (has no administration in time range)  polyethylene glycol (MIRALAX / GLYCOLAX) packet 17 g (has no administration in time range)  ondansetron (ZOFRAN) tablet 4 mg (has no administration in time range)    Or  ondansetron (ZOFRAN) injection 4 mg (has no administration in time range)     ED Discharge Orders    None       Note:  This document was prepared using Dragon voice recognition software and may include unintentional dictation errors.   Lucrezia Starch, MD 03/20/20 [phone]

## 2020-03-21 ENCOUNTER — Encounter: Payer: Self-pay | Admitting: Internal Medicine

## 2020-03-21 ENCOUNTER — Ambulatory Visit: Payer: Medicare Other | Admitting: Physician Assistant

## 2020-03-21 ENCOUNTER — Inpatient Hospital Stay (HOSPITAL_COMMUNITY)
Admission: AD | Admit: 2020-03-21 | Discharge: 2020-03-25 | DRG: 246 | Disposition: A | Discharge status: Home / Self Care | Payer: Medicare Other | Source: Other Acute Inpatient Hospital | Attending: Cardiovascular Disease | Admitting: Cardiovascular Disease

## 2020-03-21 ENCOUNTER — Encounter
Admission: EM | Disposition: A | Discharge status: Other Acute Inpatient Hospital | Payer: Self-pay | Source: Home / Self Care | Attending: Internal Medicine

## 2020-03-21 ENCOUNTER — Inpatient Hospital Stay
Admission: RE | Admit: 2020-03-21 | Discharge: 2020-03-21 | Disposition: A | Discharge status: Home / Self Care | Payer: Medicare Other | Source: Ambulatory Visit

## 2020-03-21 ENCOUNTER — Encounter: Payer: Self-pay | Admitting: Cardiovascular Disease

## 2020-03-21 DIAGNOSIS — D509 Iron deficiency anemia, unspecified: Secondary | ICD-10-CM | POA: Diagnosis not present

## 2020-03-21 DIAGNOSIS — I10 Essential (primary) hypertension: Secondary | ICD-10-CM | POA: Diagnosis present

## 2020-03-21 DIAGNOSIS — I779 Disorder of arteries and arterioles, unspecified: Secondary | ICD-10-CM | POA: Diagnosis present

## 2020-03-21 DIAGNOSIS — Z9081 Acquired absence of spleen: Secondary | ICD-10-CM

## 2020-03-21 DIAGNOSIS — I200 Unstable angina: Secondary | ICD-10-CM

## 2020-03-21 DIAGNOSIS — I214 Non-ST elevation (NSTEMI) myocardial infarction: Secondary | ICD-10-CM | POA: Diagnosis not present

## 2020-03-21 DIAGNOSIS — I272 Pulmonary hypertension, unspecified: Secondary | ICD-10-CM | POA: Diagnosis present

## 2020-03-21 DIAGNOSIS — I471 Supraventricular tachycardia: Secondary | ICD-10-CM | POA: Diagnosis not present

## 2020-03-21 DIAGNOSIS — I5031 Acute diastolic (congestive) heart failure: Secondary | ICD-10-CM | POA: Diagnosis not present

## 2020-03-21 DIAGNOSIS — E782 Mixed hyperlipidemia: Secondary | ICD-10-CM | POA: Diagnosis not present

## 2020-03-21 DIAGNOSIS — Z6837 Body mass index (BMI) 37.0-37.9, adult: Secondary | ICD-10-CM

## 2020-03-21 DIAGNOSIS — I120 Hypertensive chronic kidney disease with stage 5 chronic kidney disease or end stage renal disease: Secondary | ICD-10-CM | POA: Diagnosis not present

## 2020-03-21 DIAGNOSIS — R195 Other fecal abnormalities: Secondary | ICD-10-CM | POA: Diagnosis not present

## 2020-03-21 DIAGNOSIS — E1151 Type 2 diabetes mellitus with diabetic peripheral angiopathy without gangrene: Secondary | ICD-10-CM | POA: Diagnosis present

## 2020-03-21 DIAGNOSIS — K219 Gastro-esophageal reflux disease without esophagitis: Secondary | ICD-10-CM | POA: Diagnosis present

## 2020-03-21 DIAGNOSIS — E1122 Type 2 diabetes mellitus with diabetic chronic kidney disease: Secondary | ICD-10-CM | POA: Diagnosis present

## 2020-03-21 DIAGNOSIS — N184 Chronic kidney disease, stage 4 (severe): Secondary | ICD-10-CM

## 2020-03-21 DIAGNOSIS — N2581 Secondary hyperparathyroidism of renal origin: Secondary | ICD-10-CM | POA: Diagnosis present

## 2020-03-21 DIAGNOSIS — F17210 Nicotine dependence, cigarettes, uncomplicated: Secondary | ICD-10-CM | POA: Diagnosis present

## 2020-03-21 DIAGNOSIS — I25110 Atherosclerotic heart disease of native coronary artery with unstable angina pectoris: Secondary | ICD-10-CM | POA: Diagnosis present

## 2020-03-21 DIAGNOSIS — I5033 Acute on chronic diastolic (congestive) heart failure: Secondary | ICD-10-CM | POA: Diagnosis not present

## 2020-03-21 DIAGNOSIS — I447 Left bundle-branch block, unspecified: Secondary | ICD-10-CM | POA: Diagnosis present

## 2020-03-21 DIAGNOSIS — I2510 Atherosclerotic heart disease of native coronary artery without angina pectoris: Secondary | ICD-10-CM | POA: Diagnosis not present

## 2020-03-21 DIAGNOSIS — I5032 Chronic diastolic (congestive) heart failure: Secondary | ICD-10-CM | POA: Diagnosis not present

## 2020-03-21 DIAGNOSIS — Z20822 Contact with and (suspected) exposure to covid-19: Secondary | ICD-10-CM | POA: Diagnosis not present

## 2020-03-21 DIAGNOSIS — E875 Hyperkalemia: Secondary | ICD-10-CM | POA: Diagnosis not present

## 2020-03-21 DIAGNOSIS — I25810 Atherosclerosis of coronary artery bypass graft(s) without angina pectoris: Secondary | ICD-10-CM | POA: Diagnosis present

## 2020-03-21 DIAGNOSIS — E1129 Type 2 diabetes mellitus with other diabetic kidney complication: Secondary | ICD-10-CM | POA: Diagnosis not present

## 2020-03-21 DIAGNOSIS — D631 Anemia in chronic kidney disease: Secondary | ICD-10-CM | POA: Diagnosis present

## 2020-03-21 DIAGNOSIS — Z86010 Personal history of colonic polyps: Secondary | ICD-10-CM | POA: Diagnosis not present

## 2020-03-21 DIAGNOSIS — E669 Obesity, unspecified: Secondary | ICD-10-CM | POA: Diagnosis present

## 2020-03-21 DIAGNOSIS — I130 Hypertensive heart and chronic kidney disease with heart failure and stage 1 through stage 4 chronic kidney disease, or unspecified chronic kidney disease: Secondary | ICD-10-CM | POA: Diagnosis not present

## 2020-03-21 DIAGNOSIS — I255 Ischemic cardiomyopathy: Secondary | ICD-10-CM

## 2020-03-21 DIAGNOSIS — K921 Melena: Secondary | ICD-10-CM | POA: Diagnosis present

## 2020-03-21 DIAGNOSIS — E119 Type 2 diabetes mellitus without complications: Secondary | ICD-10-CM

## 2020-03-21 DIAGNOSIS — I132 Hypertensive heart and chronic kidney disease with heart failure and with stage 5 chronic kidney disease, or end stage renal disease: Secondary | ICD-10-CM | POA: Diagnosis not present

## 2020-03-21 DIAGNOSIS — E1121 Type 2 diabetes mellitus with diabetic nephropathy: Secondary | ICD-10-CM | POA: Diagnosis present

## 2020-03-21 DIAGNOSIS — I340 Nonrheumatic mitral (valve) insufficiency: Secondary | ICD-10-CM | POA: Diagnosis not present

## 2020-03-21 DIAGNOSIS — N185 Chronic kidney disease, stage 5: Secondary | ICD-10-CM | POA: Diagnosis not present

## 2020-03-21 DIAGNOSIS — I359 Nonrheumatic aortic valve disorder, unspecified: Secondary | ICD-10-CM | POA: Diagnosis not present

## 2020-03-21 DIAGNOSIS — N179 Acute kidney failure, unspecified: Secondary | ICD-10-CM | POA: Diagnosis present

## 2020-03-21 DIAGNOSIS — K319 Disease of stomach and duodenum, unspecified: Secondary | ICD-10-CM | POA: Diagnosis present

## 2020-03-21 DIAGNOSIS — D500 Iron deficiency anemia secondary to blood loss (chronic): Secondary | ICD-10-CM | POA: Diagnosis present

## 2020-03-21 DIAGNOSIS — I350 Nonrheumatic aortic (valve) stenosis: Secondary | ICD-10-CM | POA: Diagnosis not present

## 2020-03-21 DIAGNOSIS — I739 Peripheral vascular disease, unspecified: Secondary | ICD-10-CM | POA: Diagnosis not present

## 2020-03-21 DIAGNOSIS — I5043 Acute on chronic combined systolic (congestive) and diastolic (congestive) heart failure: Secondary | ICD-10-CM | POA: Diagnosis not present

## 2020-03-21 DIAGNOSIS — G4733 Obstructive sleep apnea (adult) (pediatric): Secondary | ICD-10-CM | POA: Diagnosis present

## 2020-03-21 DIAGNOSIS — D649 Anemia, unspecified: Secondary | ICD-10-CM | POA: Diagnosis not present

## 2020-03-21 DIAGNOSIS — E118 Type 2 diabetes mellitus with unspecified complications: Secondary | ICD-10-CM | POA: Diagnosis not present

## 2020-03-21 DIAGNOSIS — Z86718 Personal history of other venous thrombosis and embolism: Secondary | ICD-10-CM | POA: Diagnosis not present

## 2020-03-21 DIAGNOSIS — L409 Psoriasis, unspecified: Secondary | ICD-10-CM | POA: Diagnosis present

## 2020-03-21 DIAGNOSIS — G8929 Other chronic pain: Secondary | ICD-10-CM | POA: Diagnosis present

## 2020-03-21 DIAGNOSIS — Z951 Presence of aortocoronary bypass graft: Secondary | ICD-10-CM

## 2020-03-21 DIAGNOSIS — G629 Polyneuropathy, unspecified: Secondary | ICD-10-CM

## 2020-03-21 DIAGNOSIS — I351 Nonrheumatic aortic (valve) insufficiency: Secondary | ICD-10-CM | POA: Diagnosis not present

## 2020-03-21 DIAGNOSIS — R079 Chest pain, unspecified: Secondary | ICD-10-CM | POA: Diagnosis not present

## 2020-03-21 DIAGNOSIS — K5909 Other constipation: Secondary | ICD-10-CM | POA: Diagnosis present

## 2020-03-21 DIAGNOSIS — Z955 Presence of coronary angioplasty implant and graft: Secondary | ICD-10-CM

## 2020-03-21 HISTORY — DX: Rheumatic disorders of both mitral and aortic valves: I08.0

## 2020-03-21 HISTORY — DX: End stage renal disease: N18.6

## 2020-03-21 HISTORY — DX: Chronic kidney disease, stage 4 (severe): N18.4

## 2020-03-21 HISTORY — DX: Unspecified diastolic (congestive) heart failure: I50.30

## 2020-03-21 HISTORY — DX: Presence of aortocoronary bypass graft: Z95.1

## 2020-03-21 HISTORY — DX: Nonrheumatic aortic (valve) stenosis: I35.0

## 2020-03-21 HISTORY — PX: RIGHT AND LEFT HEART CATH: CATH118262

## 2020-03-21 HISTORY — DX: Atherosclerotic heart disease of native coronary artery without angina pectoris: I25.10

## 2020-03-21 LAB — HEPARIN LEVEL (UNFRACTIONATED): Heparin Unfractionated: 0.55 IU/mL (ref 0.30–0.70)

## 2020-03-21 LAB — GLUCOSE, CAPILLARY
Glucose-Capillary: 187 mg/dL — ABNORMAL HIGH (ref 70–99)
Glucose-Capillary: 194 mg/dL — ABNORMAL HIGH (ref 70–99)
Glucose-Capillary: 200 mg/dL — ABNORMAL HIGH (ref 70–99)
Glucose-Capillary: 274 mg/dL — ABNORMAL HIGH (ref 70–99)
Glucose-Capillary: 247 mg/dL — ABNORMAL HIGH (ref 70–99)

## 2020-03-21 LAB — CBC
HCT: 23.9 % — ABNORMAL LOW (ref 39.0–52.0)
Hemoglobin: 7.8 g/dL — ABNORMAL LOW (ref 13.0–17.0)
MCH: 32.2 pg (ref 26.0–34.0)
MCHC: 32.6 g/dL (ref 30.0–36.0)
MCV: 98.8 fL (ref 80.0–100.0)
Platelets: 125 10*3/uL — ABNORMAL LOW (ref 150–400)
RBC: 2.42 MIL/uL — ABNORMAL LOW (ref 4.22–5.81)
RDW: 14.1 % (ref 11.5–15.5)
WBC: 5.9 10*3/uL (ref 4.0–10.5)
nRBC: 0 % (ref 0.0–0.2)

## 2020-03-21 SURGERY — RIGHT AND LEFT HEART CATH
Anesthesia: Moderate Sedation

## 2020-03-21 MED ORDER — SODIUM CHLORIDE 0.9 % IV SOLN: 250.0000 mL | INTRAVENOUS | Status: DC | PRN

## 2020-03-21 MED ORDER — CLOPIDOGREL BISULFATE 75 MG PO TABS
75.0000 mg | ORAL_TABLET | ORAL | Status: AC
  Administered 2020-03-21: 75 mg via ORAL

## 2020-03-21 MED ORDER — SODIUM CHLORIDE 0.9% FLUSH: 3.0000 mL | INTRAVENOUS | Status: DC | PRN

## 2020-03-21 MED ORDER — SODIUM CHLORIDE 0.9% FLUSH: 3.0000 mL | Freq: Two times a day (BID) | INTRAVENOUS | Status: DC

## 2020-03-21 MED ORDER — IOHEXOL 300 MG/ML  SOLN
INTRAMUSCULAR | Status: DC | PRN
  Administered 2020-03-21: 80 mL

## 2020-03-21 MED ORDER — HEPARIN (PORCINE) IN NACL 1000-0.9 UT/500ML-% IV SOLN
INTRAVENOUS | Status: AC
  Filled 2020-03-21: qty 1000

## 2020-03-21 MED ORDER — ACETAMINOPHEN 650 MG RE SUPP: 650.0000 mg | Freq: Four times a day (QID) | RECTAL | Status: DC | PRN

## 2020-03-21 MED ORDER — VERAPAMIL HCL 2.5 MG/ML IV SOLN
INTRAVENOUS | Status: DC | PRN
  Administered 2020-03-21: 2.5 mg via INTRA_ARTERIAL

## 2020-03-21 MED ORDER — MIDAZOLAM HCL 2 MG/2ML IJ SOLN
INTRAMUSCULAR | Status: AC
  Filled 2020-03-21: qty 2

## 2020-03-21 MED ORDER — NITROGLYCERIN IN D5W 200-5 MCG/ML-% IV SOLN: 0.0000 ug/min | INTRAVENOUS | Status: DC

## 2020-03-21 MED ORDER — INSULIN ASPART 100 UNIT/ML ~~LOC~~ SOLN: 0.0000 [IU] | Freq: Every day | SUBCUTANEOUS | 11 refills | Status: DC

## 2020-03-21 MED ORDER — FENTANYL CITRATE (PF) 100 MCG/2ML IJ SOLN
INTRAMUSCULAR | Status: DC | PRN
Start: 2020-03-21 — End: 2020-03-21
  Administered 2020-03-21: 50 ug via INTRAVENOUS

## 2020-03-21 MED ORDER — CLOPIDOGREL BISULFATE 75 MG PO TABS
ORAL_TABLET | ORAL | Status: AC
  Filled 2020-03-21: qty 1

## 2020-03-21 MED ORDER — SODIUM CHLORIDE 0.9 % IV SOLN: INTRAVENOUS | Status: DC

## 2020-03-21 MED ORDER — DIPHENHYDRAMINE HCL 50 MG/ML IJ SOLN: 50.0000 mg | Freq: Once | INTRAMUSCULAR | Status: DC

## 2020-03-21 MED ORDER — ALUM & MAG HYDROXIDE-SIMETH 200-200-20 MG/5ML PO SUSP
30.0000 mL | Freq: Four times a day (QID) | ORAL | Status: DC | PRN
  Administered 2020-03-21 (×2): 30 mL via ORAL
  Filled 2020-03-21 (×2): qty 30

## 2020-03-21 MED ORDER — FENTANYL CITRATE (PF) 100 MCG/2ML IJ SOLN
INTRAMUSCULAR | Status: AC
  Filled 2020-03-21: qty 2

## 2020-03-21 MED ORDER — HEPARIN (PORCINE) 25000 UT/250ML-% IV SOLN: 1450.0000 [IU]/h | INTRAVENOUS | Status: DC

## 2020-03-21 MED ORDER — CALCIUM CARBONATE ANTACID 500 MG PO CHEW
1.0000 | CHEWABLE_TABLET | Freq: Three times a day (TID) | ORAL | Status: DC | PRN
  Filled 2020-03-21: qty 1

## 2020-03-21 MED ORDER — INSULIN ASPART 100 UNIT/ML ~~LOC~~ SOLN: 0.0000 [IU] | Freq: Three times a day (TID) | SUBCUTANEOUS | 11 refills | Status: DC

## 2020-03-21 MED ORDER — HEPARIN SODIUM (PORCINE) 1000 UNIT/ML IJ SOLN
INTRAMUSCULAR | Status: DC | PRN
  Administered 2020-03-21: 5000 [IU] via INTRAVENOUS

## 2020-03-21 MED ORDER — ASPIRIN 81 MG PO CHEW: 81.0000 mg | CHEWABLE_TABLET | ORAL | Status: AC

## 2020-03-21 MED ORDER — HEPARIN SODIUM (PORCINE) 1000 UNIT/ML IJ SOLN
INTRAMUSCULAR | Status: AC
  Filled 2020-03-21: qty 1

## 2020-03-21 MED ORDER — ACETAMINOPHEN 325 MG PO TABS: 650.0000 mg | ORAL_TABLET | Freq: Four times a day (QID) | ORAL | Status: DC | PRN

## 2020-03-21 MED ORDER — MIDAZOLAM HCL 2 MG/2ML IJ SOLN
INTRAMUSCULAR | Status: DC | PRN
  Administered 2020-03-21: 1 mg via INTRAVENOUS

## 2020-03-21 MED ORDER — ASPIRIN 81 MG PO CHEW
CHEWABLE_TABLET | ORAL | Status: AC
  Administered 2020-03-21: 81 mg via ORAL
  Filled 2020-03-21: qty 1

## 2020-03-21 MED ORDER — VERAPAMIL HCL 2.5 MG/ML IV SOLN
INTRAVENOUS | Status: AC
  Filled 2020-03-21: qty 2

## 2020-03-21 SURGICAL SUPPLY — 16 items
CANNULA 5F STIFF (CANNULA) ×3 IMPLANT
CATH 5F 110X4 TIG (CATHETERS) ×3 IMPLANT
CATH SWANZ 7F THERMO (CATHETERS) ×3 IMPLANT
DEVICE RAD TR BAND REGULAR (VASCULAR PRODUCTS) ×3 IMPLANT
GLIDESHEATH SLEND SS 6F .021 (SHEATH) ×3 IMPLANT
GLIDESHEATH SLENDER 7FR .021G (SHEATH) ×3 IMPLANT
GUIDEWIRE EMER 3M J .025X150CM (WIRE) ×3 IMPLANT
GUIDEWIRE INQWIRE 1.5J.035X260 (WIRE) ×1 IMPLANT
INQWIRE 1.5J .035X260CM (WIRE) ×3
KIT MANI 3VAL PERCEP (MISCELLANEOUS) ×3 IMPLANT
NEEDLE PERC 18GX7CM (NEEDLE) IMPLANT
PACK CARDIAC CATH (CUSTOM PROCEDURE TRAY) ×3 IMPLANT
SHEATH AVANTI 5FR X 11CM (SHEATH) IMPLANT
SHEATH AVANTI 7FRX11 (SHEATH) IMPLANT
WIRE GUIDERIGHT .035X150 (WIRE) IMPLANT
WIRE HITORQ VERSACORE ST 145CM (WIRE) ×3 IMPLANT

## 2020-03-21 NOTE — Consult Note (Signed)
[place] for heparin dosing Indication: chest pain/ACS  Allergies  Allergen Reactions  . Contrast Media [Iodinated Diagnostic Agents] Rash and Other (See Comments)    Got very hot and red   . Glipizide Other (See Comments)    ANTIDIABETICS. Burning  . Hydroxychloroquine Other (See Comments)    Stomach upset  . Metrizamide Other (See Comments)    Got very hot and red  . Penicillins Hives and Swelling    Has patient had a PCN reaction causing immediate rash, facial/tongue/throat swelling, SOB or lightheadedness with hypotension: yes Has patient had a PCN reaction causing severe rash involving mucus membranes or skin necrosis: no  Has patient had a PCN reaction that required hospitalization: yes Has patient had a PCN reaction occurring within the last 10 years: no If all of the above answers are "NO", then may proceed with Cephalosporin use.    Patient Measurements: Height: 5\' 6"  (167.6 cm) Weight: 106 kg (233 lb 11 oz) IBW/kg (Calculated) : 63.8 Heparin Dosing Weight: 87.6  Vital Signs: Temp: 97.6 F (36.4 C) (08/30 0719) Temp Source: Oral (08/30 0719) BP: 112/55 (08/30 1130) Pulse Rate: 86 (08/30 1130)  Labs: Recent Labs    03/20/20 0620 03/20/20 0827 03/20/20 0919 03/20/20 1255 03/20/20 1752 03/21/20 0341  HGB 8.0*  --   --   --   --  7.8*  HCT 24.3*  --   --   --   --  23.9*  PLT 125*  --   --   --   --  125*  APTT 34  --   --   --   --   --   LABPROT 14.0  --   --   --   --   --   INR 1.1  --   --   --   --   --   HEPARINUNFRC  --   --   --   --  0.17* 0.55  CREATININE 4.21*  --  4.58*  --   --   --   TROPONINIHS 19* 51*  --  86*  --   --     Estimated Creatinine Clearance: 18.1 mL/min (A) (by C-G formula based on SCr of 4.58 mg/dL (H)).   Medical History: Past Medical History:  Diagnosis Date  . Adenomatous colon polyp   . Allergy   . Angina at rest [facility])    chronic  . Anxiety   . Arthritis    RA  . Bell's  palsy 2007  . Carotid artery occlusion   . Cataract    Dr. Dawna Part  . CHF (congestive heart failure) ([place])   . Coronary artery disease   . Depression   . Diabetes mellitus   . Diverticulosis   . Duodenitis   . DVT (deep venous thrombosis) (HCC)    in leg  . Dyspnea    DOE  . Gastropathy   . GERD (gastroesophageal reflux disease)   . Heart murmur   . Helicobacter pylori gastritis   . Hyperlipidemia   . Hypertension   . Kidney failure   . Mild aortic stenosis   . Neuropathy   . Obesity   . Peripheral vascular disease ([place])   . Post splenectomy syndrome   . Psoriasis   . Psoriatic arthritis ([place])   . Renal disorder    stage 4 kidney failure  . Sleep apnea    uses cpap  . SOB (shortness of breath)   .  Stroke ([place])   . Tobacco use disorder    recently quit  . Tubular adenoma 08/2013   Dr. Hilarie Fredrickson  . Weak urinary stream     Medications:  Scheduled:  . [MAR Hold] amLODipine  2.5 mg Oral Daily  . [MAR Hold] aspirin EC  81 mg Oral Daily  . [MAR Hold] busPIRone  20 mg Oral BID  . [MAR Hold] calcitRIOL  0.25 mcg Oral Q M,W,F  . [MAR Hold] carvedilol  12.5 mg Oral Daily  . clopidogrel      . diphenhydrAMINE  50 mg Intravenous Once  . [MAR Hold] finasteride  5 mg Oral Daily  . [MAR Hold] furosemide  40 mg Intravenous BID  . [MAR Hold] insulin aspart  0-5 Units Subcutaneous QHS  . [MAR Hold] insulin aspart  0-9 Units Subcutaneous TID WC  . [MAR Hold] predniSONE  50 mg Oral Daily  . [MAR Hold] simvastatin  20 mg Oral QPM  . sodium chloride flush  3 mL Intravenous Q12H  . sodium chloride flush  3 mL Intravenous Q12H  . [MAR Hold] tamsulosin  0.4 mg Oral Daily    Assessment: 64 year old male presenting with exertional shortness of breath and chest pain. PMH includes CAD s/p CABG (2012), diabetes, CKD stage IV, HTN, HLD, PAD, and psoriasis. Pt will not receive heparin bolus per MD. Hgb 10.1 (02/23/20) >8.8>8.0>7.8. Plt 173 (02/23/20) >131>125.   8/30 heparin gtt held for heart  cath - pharmacy consulted to restart heparin 8 hours after sheath removal. Sheath removed at 0941. Procedure found significant three-vessel CAD. Pt will be evaluated for TAVR + PCI.   Goal of Therapy:  Heparin level 0.3-0.7 units/ml Monitor platelets by anticoagulation protocol: Yes   Plan:  - Pt therapeutic prior to heart cath. Will restart at rate of 1450 units/hr @1930 .  - Will check HL 8/31 @ 0300 - CBC low stable - will continue to monitor.  Benn Moulder, PharmD Pharmacy Resident  03/21/2020 11:36 AM

## 2020-03-21 NOTE — Progress Notes (Signed)
   In the event of transfer the evening of 03/21/2020, please be advised that orders are placed and pended under orders. IV heparin is to be resumed 8 hours s/p sheath pull. NPO after midnight given plan for PCI to RCA and evaluation for TAVR. Please refer to H&P and MD attestation of H&P for recommendations regarding transfusion and volume status.  Transfer orders cannot be released at this time, given current patient status and lack of hospital account number. Orders (including SSI) have been completed, however, and are pending release by cardiology team at [facility].  Signed, Arvil Chaco, PA-C 03/21/2020, 6:47 PM

## 2020-03-21 NOTE — H&P (Signed)
Cardiology Admission History and Physical:   Patient ID: [name] MRN: [id number]; DOB: Dec 18, 1955   Admission date: (Not on file)  Primary Care Provider: [name], MD [facility] Cardiologist: [name], MD  [place] Electrophysiologist:  None  Chief Complaint:  Unstable Angina, Severe Aortic Stenosis  Patient Profile:   [name] [name] is a 64 y.o. male with history of CAD s/p CABG in 2012, severe aortic stenosis by [place] 03/21/2020, carotid disease s/p left-sided CEA with recent 12/2019 carotid studies as below under CV studies, DM2, CKD stage IV, anemia of chronic disease, mildly dilated aortic root, hypertension, hyperlipidemia, PAD s/p extensive lower extremity revascularization by vascular surgery 05/2019, tobacco use though reportedly recently quit, obesity, and psoriasis.  History of Present Illness:   [name] is a 64 year old male with complex PMH as above.  He underwent stress testing in 2013 without evidence of ischemia.  Echo 12/2018 showed normal LV SF with moderate aortic stenosis and aortic valve area 1.28 cm with a mean gradient of 20 mmHg and mildly dilated aortic root at 4 cm.   He was seen by Dr. Vesta Mixer and doing well from a cardiac perspective.  He was coping with the passing of his wife from pancreatic cancer.  His renal function was noted to continue to deteriorate.  He was followed by nephrology.  12/2019 echo showed low normal EF 50 to 55%, no regional wall motion abnormalities, mild LVH, G2 DD, mildly dilated left atrium, mild to moderate MR, and moderate aortic stenosis with valve area 1.06 cm and a mean gradient of 26 mm.  He was seen in the ED 02/08/2020 with right-sided flank pain that was worse with movement.  CT renal stone protocol without significant findings.  He presented to [facility] 7/24 with non-STEMI and high-sensitivity troponin peaking at 470.  Plan was to undergo [facility], though this was aborted given respiratory status  during procedure with plans for [facility] following diuresis with Lasix GTT.  He reportedly wanted to go home; therefore, cath was deferred given declining renal function with discharge creatinine 4.41.  He was seen at hospital follow-up 8/3 and doing very well.  He had one episode of chest pain at rest that was similar to his previous episodes.  Optimization of medical therapy was recommended, given his underlying renal dysfunction.  Imdur was titrated to 90 mg.  His weight was noted to be down 4 pounds.  He was seen again 02/2020 and noted continued exertional angina and fatigue.  He was having to take 2-3 sublingual nitro every other day due to chest discomfort.  He was scheduled for right and left heart cath.  Approximately 6 PM on 03/19/2020, he developed chest pain that felt like an elephant sitting on his chest and took 3 sublingual nitro.  At 3:30 AM on 8/29, he took an additional 3 nitro and ASA.  When the pain did not improve, he contacted EMS.  They administered nitro spray.  He was noted to be hypoxic with saturations into the 80s placed on 2 L nasal cannula.  Given his continued CP, he was started on heparin and nitro infusion.  He was scheduled for [facility] the following day and while admitted.  8/30/21R/LHC showed significant underlying 3v CAD with patent LIMA to LAD and SVG to OM 30.  Radial graft to RCA was occluded at the ostium with critical stenosis of the ostial native RCA.  This was presumed the likely culprit for his recent / previous admission with  NSTEMI and current admission unstable angina.  Severe aortic stenosis was noted with peak gradient of 27.4 mmHg and valve area of 0.87.  RHC showed moderately to severely elevated pressures with  PCWP 31 mmHg, moderate pulmonary hypertension at 50/30 mmHg, and normal CO at 5.06 with cardiac index 2.34.  Prominent V wave on pressure tracing was suggestive of significant mitral regurgitation as well.  As indicated in cath report, this was noted to be  an overall difficult case.  Given his need to be evaluated for TAVR plus RCA PCI, transfer to [facility] was recommended.  Advanced chronic kidney disease and anemia also made this case complicated, in addition to significant heart failure.  It was noted that the patient would most likely need to be initiated on hemodialysis in order to improve his volume status.  Recommendation was made for transfusion to keep hemoglobin above 9.  It was also noted, however, that he may not be able to tolerate large volume shifts given his significant CAD and aortic stenosis.  Most recent [facility] labs significant for 8/29 sodium 138, potassium 3.8, creatinine 4.21, BUN 63, magnesium 2.2.  Hemodialysis recommendations discussed as below.  High-sensitivity troponin 15  19  51  86.  EKG significant for atrial flutter but without significant ST/T changes.  BNP 503.2.  Significant anemia noted with 8/30 labs and showing hemoglobin 7.8, hematocrit 23.9, RBC 2.42, platelets 125.  Iron studies show iron 138, ferritin 32, folate 10.1, B12 281, recommendation for transfusion as below.  The patient will be transferred to [facility].  Internal medicine at [facility] has been notified with discharge summary and EMTALA form completed.  Both the [facility] structural and admitting cardiologists have been notified of this transfer.  [facility] has been called.  H&P completed.  Orders to be completed and pended, given the patient does not yet have an account number.  Of note, he was scheduled with Dr. Delana Meyer for PermCath insertion 03/23/2020 at 11:30 AM.   Past Medical History:  Diagnosis Date  . 3-vessel CAD    8/30 LHC with radial graft to RCA occluded at the ostium with critical stenosis of the ostial native RCA.  . Adenomatous colon polyp   . Allergy   . Anxiety   . Arthritis    RA  . Bell's palsy 2007  . CAD (coronary artery disease)    03/21/2020 LHC with patent LIMA to LAD and SVG to OM 3.  Radial graft to RCA occluded at the ostium  with critical stenosis of the ostial and native RCA.  . Carotid artery occlusion    Bilateral carotid stenosis s/p left-sided CEA.  . Cataract    Dr. Dawna Part  . CKD (chronic kidney disease), stage IV ([place])    03/2020 vascular surgery access planned  . Depression   . Diabetes mellitus   . Diverticulosis   . Duodenitis   . DVT (deep venous thrombosis) (HCC)    in leg  . Gastropathy   . GERD (gastroesophageal reflux disease)   . Heart failure with preserved ejection fraction ([place])    8/30 RHC with moderately to severely elevated filling pressures and pulmonary wedge pressure 31 mmHg, moderate pulmonary hypertension  . Helicobacter pylori gastritis   . Hx of CABG    2012  . Hyperlipidemia   . Hypertension   . Mitral regurgitation and aortic stenosis    RHC 8/30 and echo 12/2019.  [name] Neuropathy   . Obesity   . Peripheral vascular disease ([place])  S/p extensive revascularization by vascular surgery 05/2019  . Post splenectomy syndrome   . Psoriasis   . Psoriatic arthritis ([place])   . Renal disorder    stage 4 kidney failure  . Severe aortic stenosis    03/21/20 RHC with peak gradient 27.4 mmHg and valve area 0.87.  [name] Sleep apnea    uses cpap  . Stroke ([place])   . Tobacco use disorder    recently quit  . Tubular adenoma 08/2013   Dr. Hilarie Fredrickson  . Weak urinary stream     Past Surgical History:  Procedure Laterality Date  . CARDIAC CATHETERIZATION  08/2010   LAD: 80% ISR, RCA: 80% ostial, OM 80-90%  . CAROTID ENDARTERECTOMY  08/2010   left/ Dr. Kellie Simmering  . CATARACT EXTRACTION W/PHACO Left 07/09/2017   Procedure: CATARACT EXTRACTION PHACO AND INTRAOCULAR LENS PLACEMENT (IOC);  Surgeon: Birder Robson, MD;  Location: [facility] ORS;  Service: Ophthalmology;  Laterality: Left;  [place] 00:23 AP% 14.2 CDE 3.26 Fluid pack lot # [id number]  . CATARACT EXTRACTION W/PHACO Right 09/10/2017   Procedure: CATARACT EXTRACTION PHACO AND INTRAOCULAR LENS PLACEMENT (IOC);  Surgeon: Birder Robson, MD;   Location: [facility] ORS;  Service: Ophthalmology;  Laterality: Right;  [place] 00:26.4 AP% 17.3 CDE 4.59 Fluid Pack Lot # [id number]  . COLONOSCOPY    . CORONARY ANGIOPLASTY     LAD: before CABG  . CORONARY ARTERY BYPASS GRAFT  09/06/2010   At [facility]: LIMA to LAD, left radial to RCA, sequential SVG to OM3 and 4  . heart stents  Jan 2011   leg stents 06/2009 and 03/2010  . LOWER EXTREMITY ANGIOGRAPHY Left 06/09/2019   Procedure: LOWER EXTREMITY ANGIOGRAPHY;  Surgeon: Katha Cabal, MD;  Location: [place] CV [facility];  Service: Cardiovascular;  Laterality: Left;  . PTA of illiac and SFA  multiple   Dr. Ronalee Belts, s/p revision 11/2012  . RIGHT AND LEFT HEART CATH N/A 03/21/2020   Procedure: RIGHT AND LEFT HEART CATH possible percutaneous intervention;  Surgeon: Wellington Hampshire, MD;  Location: [place] [facility];  Service: Cardiovascular;  Laterality: N/A;  . RIGHT/LEFT HEART CATH AND CORONARY/GRAFT ANGIOGRAPHY N/A 02/15/2020   Procedure: RIGHT/LEFT HEART CATH AND CORONARY/GRAFT ANGIOGRAPHY;  Surgeon: Wellington Hampshire, MD;  Location: [place] [facility];  Service: Cardiovascular;  Laterality: N/A;  . SPLENECTOMY    . VASCULAR SURGERY     LEG STENTS     Medications Prior to Admission: Prior to Admission medications   Medication Sig Start Date End Date Taking? Authorizing Provider  albuterol (PROAIR HFA) 108 (90 Base) MCG/ACT inhaler Inhale 2 puffs into the lungs every 6 (six) hours as needed for shortness of breath. 09/14/19   [name], MD  amLODipine (NORVASC) 2.5 MG tablet Take 1 tablet (2.5 mg total) by mouth daily. 02/19/20   Sharen Hones, MD  aspirin 81 MG tablet Take 81 mg by mouth daily.    [provider]  buPROPion (WELLBUTRIN SR) 150 MG 12 hr tablet Take 1 tablet (150 mg total) by mouth daily. Patient not taking: Reported on 03/20/2020 02/23/20   [name], MD  busPIRone (BUSPAR) 10 MG tablet TAKE 2 TABLETS(20 MG) BY MOUTH TWICE DAILY Patient taking differently:  Take 20 mg by mouth 2 (two) times daily.  02/19/20   [name], MD  calcitRIOL (ROCALTROL) 0.25 MCG capsule Take 0.25 mcg by mouth every Monday, Wednesday, and Friday. Monday then Wednesday then Friday    [provider]  carvedilol (  COREG) 12.5 MG tablet Take 1 tablet (12.5 mg total) by mouth 2 (two) times daily. Patient taking differently: Take 12.5 mg by mouth daily.  12/10/19   Wellington Hampshire, MD  clopidogrel (PLAVIX) 75 MG tablet Take 75 mg by mouth daily.    [provider]  Dextran 70-Hypromellose (ARTIFICIAL TEARS) 0.1-0.3 % SOLN Place 1 drop into both eyes 4 (four) times daily as needed (dry eyes).    [provider]  finasteride (PROSCAR) 5 MG tablet Take 5 mg by mouth daily.    [provider]  isosorbide mononitrate (IMDUR) 30 MG 24 hr tablet Take 3 tablets (90 mg total) by mouth daily. 02/23/20   Dunn, Areta Haber, PA-C  nitroGLYCERIN (NITROSTAT) 0.4 MG SL tablet Place 1 tablet (0.4 mg total) under the tongue every 5 (five) minutes as needed. Maximum of 3 doses. Patient taking differently: Place 0.4 mg under the tongue every 5 (five) minutes as needed for chest pain. Maximum of 3 doses. 02/23/20   Dunn, Areta Haber, PA-C  predniSONE (DELTASONE) 50 MG tablet Take 1 tablet at 11 pm on night before procedure, take 1 tablet at 5 am the morning of procedure and take 1 tablet at 9:30 AM the morning of procedure 03/11/20   [name], PA-C  sertraline (ZOLOFT) 100 MG tablet Take 1 tablet (100 mg total) by mouth daily. 02/23/20   [name], MD  simvastatin (ZOCOR) 20 MG tablet TAKE 1 TABLET(20 MG) BY MOUTH DAILY AT 6 PM Patient taking differently: Take 20 mg by mouth every evening.  11/20/19   Wellington Hampshire, MD  tamsulosin (FLOMAX) 0.4 MG CAPS capsule Take 1 capsule by mouth daily Patient taking differently: Take 0.4 mg by mouth daily.  01/20/20   [name], MD  torsemide (DEMADEX) 20 MG tablet Take 2 tablets (40 mg total) by mouth daily. 02/24/20    [name], MD     Allergies:    Allergies  Allergen Reactions  . Contrast Media [Iodinated Diagnostic Agents] Rash and Other (See Comments)    Got very hot and red   . Glipizide Other (See Comments)    ANTIDIABETICS. Burning  . Hydroxychloroquine Other (See Comments)    Stomach upset  . Metrizamide Other (See Comments)    Got very hot and red  . Penicillins Hives and Swelling    Has patient had a PCN reaction causing immediate rash, facial/tongue/throat swelling, SOB or lightheadedness with hypotension: yes Has patient had a PCN reaction causing severe rash involving mucus membranes or skin necrosis: no  Has patient had a PCN reaction that required hospitalization: yes Has patient had a PCN reaction occurring within the last 10 years: no If all of the above answers are "NO", then may proceed with Cephalosporin use.     Social History:   Social History   Socioeconomic History  . Marital status: Widowed    Spouse name: Not on file  . Number of children: 2  . Years of education: Not on file  . Highest education level: Not on file  Occupational History  . Occupation: Chief Operating Officer at [employer]: Disabled mostly due to neuropathy  Tobacco Use  . Smoking status: Former Smoker    Packs/day: 0.75    Years: 40.00    Pack years: 30.00    Types: Cigarettes    Quit date: 04/15/2017    Years since quitting: 2.9  . Smokeless tobacco: Never Used  . Tobacco comment:  1 cigarette a day  Vaping Use  . Vaping Use: Never used  Substance and Sexual Activity  . Alcohol use: No  . Drug use: No  . Sexual activity: Never  Other Topics Concern  . Not on file  Social History Narrative   Wife died 26-Jan-2019      Has living will   Gearldine Shown and his wife Levada Dy should be his health care POA   Would accept resuscitation --but no prolonged ventilation   No tube feeds if cognitively unaware   Social Determinants of Health   Financial Resource Strain: Low Risk   .  Difficulty of Paying Living Expenses: Not very hard  Food Insecurity: No Food Insecurity  . Worried About Charity fundraiser in the Last Year: Never true  . Ran Out of Food in the Last Year: Never true  Transportation Needs: No Transportation Needs  . Lack of Transportation (Medical): No  . Lack of Transportation (Non-Medical): No  Physical Activity:   . Days of Exercise per Week: Not on file  . Minutes of Exercise per Session: Not on file  Stress: No Stress Concern Present  . Feeling of Stress : Only a little  Social Connections: Unknown  . Frequency of Communication with Friends and Family: More than three times a week  . Frequency of Social Gatherings with Friends and Family: Not on file  . Attends Religious Services: Not on file  . Active Member of Clubs or Organizations: Not on file  . Attends Archivist Meetings: Not on file  . Marital Status: Not on file  Intimate Partner Violence: Unknown  . Fear of Current or Ex-Partner: No  . Emotionally Abused: No  . Physically Abused: No  . Sexually Abused: Not on file    Family History:   The patient's family history includes Alcoholism in his brother, brother, sister, sister, and sister; Alzheimer's disease in his mother; Arthritis in his father and mother; Breast cancer (age of onset: 86) in his sister; Colon polyps (age of onset: 31) in his paternal grandfather; Dementia in his mother; Diabetes in his paternal grandfather; Epilepsy in his sister; Lung cancer (age of onset: 61) in his father.    ROS:  Please see the history of present illness.  Please refer to MD progress note from today. All other ROS reviewed and negative.     Physical Exam/Data:  There were no vitals filed for this visit. No intake or output data in the 24 hours ending 03/21/20 1530 Last 3 Weights 03/21/2020 03/20/2020 03/20/2020  Weight (lbs) 233 lb 11 oz 233 lb 11.2 oz 225 lb  Weight (kg) 106 kg 106.006 kg 102.059 kg  Some encounter information is  confidential and restricted. Go to Review Flowsheets activity to see all data.     There is no height or weight on file to calculate BMI.  Please refer to MD progress note from today for physical exam.   EKG:  The ECG that was done 8/29 showed atrial flutter with ventricular rate 96 bpm (notching of P waves noted best in V1, lead III).  Relevant CV Studies:  03/21/2020 [facility] 03/21/2020 1.  Significant underlying three-vessel coronary artery disease with patent LIMA to LAD and SVG to OM 3.  Radial graft to RCA is occluded at the ostium with critical stenosis of the ostial native right coronary artery.  This is the likely culprit for recent non-STEMI and current unstable angina. 2.  Severe aortic stenosis with peak gradient  of 27.4 mmHg and valve area of 0.87. 3.  Right heart catheterization showed moderately to severely elevated filling pressures with pulmonary wedge pressure of 31 mmHg, moderate pulmonary hypertension at 50/30 mmHg and normal cardiac output at 5.06 with a cardiac index of 2.36.  Prominent V waves on wedge pressure tracing suggestive of significant mitral regurgitation. Recommendations: This is an overall difficult case.  The patient will need to be evaluated for TAVR plus RCA PCI.  Advanced chronic kidney disease and anemia in addition to significant heart failure make timing of this difficult.  The patient will likely need to be initiated on hemodialysis in order to improve his volume status.  He needs blood transfusion to keep his hemoglobin above 9.  Nonetheless, he might not be able to tolerate large volume shifts due to significant coronary artery disease and aortic stenosis. I will discuss with our structural heart team.  It might be best to transfer the patient to [facility]. Vascular access on this patient is very difficult due to extensive stenting and calcifications of the lower extremities.  The SFA stent on the right side might be extending into the common  femoral artery.  Arterial access might be easier via the left femoral artery.  Echo 01/11/2020 1. Left ventricular ejection fraction, by estimation, is 50 to 55%. The  left ventricle has low normal function. The left ventricle has no regional  wall motion abnormalities. There is mild left ventricular hypertrophy.  Left ventricular diastolic  parameters are consistent with Grade II diastolic dysfunction  (pseudonormalization).  2. Right ventricular systolic function is normal. The right ventricular  size is normal. There is moderately elevated pulmonary artery systolic  pressure.  3. Left atrial size was mildly dilated.  4. The mitral valve is abnormal. Mild to moderate mitral valve  regurgitation. No evidence of mitral stenosis.  5. The aortic valve is abnormal. Aortic valve regurgitation is not  visualized. Moderate aortic valve stenosis. Aortic valve area, by VTI  measures 1.06 cm. Aortic valve mean gradient measures 26.0 mmHg.  6. Aortic dilatation noted. There is mild dilatation of the ascending  aorta measuring 41 mm.  7. The inferior vena cava is normal in size with <50% respiratory  variability, suggesting right atrial pressure of 8 mmHg.  Comparison(s): Previous Echo showed normal LV systolic function with  moderate aortic stenosis and mildly dilated aortic root at 4 cm. Aortic  valve area was 1.28 cm.    Carotids 01/11/2020 Summary:  Right Carotid: Velocities in the right ICA are consistent with a 40-59%         stenosis.  Left Carotid: Velocities in the left ICA are consistent with a 1-39%  stenosis.  Vertebrals: Bilateral vertebral arteries demonstrate antegrade flow.  Subclavians: Normal flow hemodynamics were seen in bilateral subclavian        arteries.   ABI/arterial study 10/28/2019 Summary:  Right: Resting right ankle-brachial index indicates mild right lower  extremity arterial disease. The right toe-brachial index is abnormal.  Right  TBI appears essentially unchanged from the previous exam on  07/29/2019.  Left: Resting left ankle-brachial index indicates mild left lower  extremity arterial disease. The left toe-brachial index is abnormal.  Left TBI appears decreased from the previous exam on 07/29/2019.  *See table(s) above for measurements and observations.   Laboratory Data:  High Sensitivity Troponin:   Recent Labs  Lab 03/11/20 1134 03/20/20 0620 03/20/20 0827 03/20/20 1255  TROPONINIHS 15 19* 51* 86*  Chemistry Recent Labs  Lab 03/15/20 0834 03/15/20 0834 03/20/20 0620 03/20/20 0919  NA 138  --  138  --   K 4.6  --  3.8  --   CL 100  --  105  --   CO2 26  --  20*  --   GLUCOSE 165*  --  178*  --   BUN 52*  --  63*  --   CREATININE 4.32*   < > 4.21* 4.58*  CALCIUM 8.7*  --  7.8*  --   GFRNONAA 13*   < > 14* 12*  GFRAA 15*   < > 16* 14*  ANIONGAP 12  --  13  --    < > = values in this interval not displayed.    No results for input(s): PROT, ALBUMIN, AST, ALT, ALKPHOS, BILITOT in the last 168 hours. Hematology Recent Labs  Lab 03/20/20 0620 03/20/20 1358 03/21/20 0341  WBC 6.6  --  5.9  RBC 2.49* 2.48* 2.42*  HGB 8.0*  --  7.8*  HCT 24.3*  --  23.9*  MCV 97.6  --  98.8  MCH 32.1  --  32.2  MCHC 32.9  --  32.6  RDW 14.3  --  14.1  PLT 125*  --  125*   BNP Recent Labs  Lab 03/20/20 0620  BNP 503.2*    DDimer No results for input(s): DDIMER in the last 168 hours.   Radiology/Studies:  CARDIAC CATHETERIZATION  Result Date: 03/21/2020  Colon [place]attery Cx to Prox Cx lesion is 85% stenosed.  Mid Cx to Dist Cx lesion is 100% stenosed.  1st Mrg lesion is 70% stenosed.  Prox LAD to Mid LAD lesion is 90% stenosed.  Ost RCA to Prox RCA lesion is 99% stenosed.  SVG.  The graft exhibits mild diffuse disease.  Left radial artery graft was visualized by angiography.  Origin lesion is 100% stenosed.  LIMA graft was visualized by non-selective angiography and is normal in caliber.  The graft  exhibits no disease.  1.  Significant underlying three-vessel coronary artery disease with patent LIMA to LAD and SVG to OM 3.  Radial graft to RCA is occluded at the ostium with critical stenosis of the ostial native right coronary artery.  This is the likely culprit for recent non-STEMI and current unstable angina. 2.  Severe aortic stenosis with peak gradient of 27.4 mmHg and valve area of 0.87. 3.  Right heart catheterization showed moderately to severely elevated filling pressures with pulmonary wedge pressure of 31 mmHg, moderate pulmonary hypertension at 50/30 mmHg and normal cardiac output at 5.06 with a cardiac index of 2.36.  Prominent V waves on wedge pressure tracing suggestive of significant mitral regurgitation. Recommendations: This is an overall difficult case.  The patient will need to be evaluated for TAVR plus RCA PCI.  Advanced chronic kidney disease and anemia in addition to significant heart failure make timing of this difficult.  The patient will likely need to be initiated on hemodialysis in order to improve his volume status.  He needs blood transfusion to keep his hemoglobin above 9.  Nonetheless, he might not be able to tolerate large volume shifts due to significant coronary artery disease and aortic stenosis. I will discuss with our structural heart team.  It might be best to transfer the patient to [facility]. Vascular access on this patient is very difficult due to extensive stenting and calcifications of the lower extremities.  The SFA stent on the right  side might be extending into the common femoral artery.  Arterial access might be easier via the left femoral artery.       TIMI Risk Score for Unstable Angina or Non-ST Elevation MI:   The patient's TIMI risk score is 6, which indicates a 41% risk of all cause mortality, new or recurrent myocardial infarction or need for urgent revascularization in the next 14 days.   New York Heart Association (NYHA) Functional  Class NYHA Class II  Assessment and Plan:   Recent non-STEMI, unstable angina Significant three-vessel CAD s/p CABG 2012 --S/p R/LHC today 8/30 with patent LIMA to LAD and SVG to OM 3.  Radial graft to RCA occluded at the ostium with critical stenosis of the ostial native right coronary artery.   --Vascular access noted to be difficult due to extensive stenting and calcifications of the lower extremities.  Right SFA stent might be extending into the common femoral artery.  Arterial access might be easier via left femoral artery. --Transfer to [facility] for evaluation of RCA PCI and TAVR as below.  --NPO after midnight.  Further recommendations pending MD. --CKD and anemia complicates this case with recommendations including transfusion and consideration of HD as outlined below.   Severe aortic stenosis by RHC 03/21/20 --RHC showed severe aortic stenosis with peak gradient of 27.4 mmHg and valve area of 0.87.   --N.p.o. after midnight. --Transfer to [facility] recommended for evaluation of TAVR plus RCA PCI. --Dr. Fletcher Anon has discussed this with the structural heart team.  Acute on chronic HFpEF --RHC with moderately to severely elevated filling pressures and pulmonary wedge pressure 31 mmHg, moderate pulmonary hypertension at 50/30 mmHg and normal CO at 5.06 with CI 2.36.  Prominent V waves on wedge pressure tracing suggestive of significant MR. --Continue to monitor I's/O's, daily standing weights. --Started on Lasix.  Continue. --Daily BMET.  Monitor electrolytes with K goal 4.0 and magnesium goal 2.0. --Recommendation has been for hemodialysis in order to improve his volume status. As noted in cath report, he may not be able to tolerate large volume shifts due to significant CAD and aortic stenosis.  CKD IV --Daily BMET. --Given advanced chronic kidney disease and anemia, difficult case. Recommendation has been for hemodialysis in order to improve his volume status. Though, as noted in  cath report, he may not be able to tolerate large volume shifts due to significant CAD and aortic stenosis.  Severe Anemia --Daily CBC.  --Blood transfusion recommended to keep hemoglobin above 9.   Mitral regurgitation --Continue to monitor with periodic echo.  PAD Carotid disease s/p left-sided CEA --Aggressive risk factor modification.  Most recent CV studies as above.   DM2 --Sliding scale insulin.  Glycemic control recommended for ongoing risk factor modification.  Severity of Illness: The appropriate patient status for this patient is OBSERVATION. Observation status is judged to be reasonable and necessary in order to provide the required intensity of service to ensure the patient's safety. The patient's presenting symptoms, physical exam findings, and initial radiographic and laboratory data in the context of their medical condition is felt to place them at decreased risk for further clinical deterioration. Furthermore, it is anticipated that the patient will be medically stable for discharge from the hospital within 2 midnights of admission. The following factors support the patient status of observation.   " The patient's presenting symptoms include unstable angina, severe AS. " The physical exam findings include unstable angina, severe AS. " The initial radiographic and laboratory data are  unstable angina, severe AS.     For questions or updates, please contact [place] Please consult [email or web address] for contact info under     Signed, Arvil Chaco, PA-C  03/21/2020 3:30 PM

## 2020-03-21 NOTE — Discharge Summary (Signed)
Physician Discharge Summary  [name] [name] PXT:[id number] DOB: 1956/02/27 DOA: 03/20/2020  PCP: [name], MD  Admit date: 03/20/2020 Discharge date: 03/21/2020  Admitted From: home Disposition:  Transfer to [facility]  Recommendations for Outpatient Follow-up:  1. Follow ups per discharging service at [facility]: n/a  Equipment/Devices: n/a   Discharge Condition: Stable  CODE STATUS: Full  Diet recommendation: Heart Healthy    Discharge Diagnoses: Active Problems:   DM (diabetes mellitus), type 2 with complications ([place])   Unstable angina (HCC)    Summary of HPI and Hospital Course:  From H&P by Dr. [name] on Admission: "[name] [name]  is a 64 y.o. male with a known history of coronary artery disease status post CABG in 2012, moderate aortic stenosis, carotid artery stenosis status post left-sided CEA, type II diabetes on insulin, CKD stage IV, hypertension, hyper hyperlipidemia, PAD status post extensive lower extremity revascularization by surgery, obesity comes to the emergency room with increasing exertional shortness of breath and chest pain. ED course: afebrile blood pressure 134/81 normal sinus rhythm on EKG heart rate 96 sats hundred percent on room air troponin 15-- 19 BNP 503 hemoglobin 8.0 creatinine 4.2 (baseline creatinine 3.5-3.7)"   He was admitted to hospitalist service with cardiology consulted for further evaluation and management of unstable angina, symptomatic with dyspnea on exertion.  Started on nitro and heparin infusions.  Patient was taken to cath lab today where he was found to have significant three-vessel CAD with occluded radial graft to RCA at the ostium with critical stenosis of the ostial native RCA.    Plan is for transfer to [facility] for RCA PCI and evaluation for TAVR.    Patient has CKD stage IV, followed by Dr. [name].  Discussions and plans are underway for eventual dialysis, which will likely be required after  cath procedures.    Patient is clinically improved and hemodynamically stable for transfer.    Discharge Instructions   Discharge Instructions    Diet - low sodium heart healthy   Complete by: As directed    Increase activity slowly   Complete by: As directed      Allergies as of 03/21/2020      Reactions   Contrast Media [iodinated Diagnostic Agents] Rash, Other (See Comments)   Got very hot and red   Glipizide Other (See Comments)   ANTIDIABETICS. Burning   Hydroxychloroquine Other (See Comments)   Stomach upset   Metrizamide Other (See Comments)   Got very hot and red   Penicillins Hives, Swelling   Has patient had a PCN reaction causing immediate rash, facial/tongue/throat swelling, SOB or lightheadedness with hypotension: yes Has patient had a PCN reaction causing severe rash involving mucus membranes or skin necrosis: no  Has patient had a PCN reaction that required hospitalization: yes Has patient had a PCN reaction occurring within the last 10 years: no If all of the above answers are "NO", then may proceed with Cephalosporin use.      Medication List    STOP taking these medications   clopidogrel 75 MG tablet Commonly known as: PLAVIX     TAKE these medications   albuterol 108 (90 Base) MCG/ACT inhaler Commonly known as: ProAir HFA Inhale 2 puffs into the lungs every 6 (six) hours as needed for shortness of breath.   amLODipine 2.5 MG tablet Commonly known as: NORVASC Take 1 tablet (2.5 mg total) by mouth daily.   Artificial Tears 0.1-0.3 % Soln Generic  drug: Dextran 70-Hypromellose Place 1 drop into both eyes 4 (four) times daily as needed (dry eyes).   aspirin 81 MG tablet Take 81 mg by mouth daily.   buPROPion 150 MG 12 hr tablet Commonly known as: WELLBUTRIN SR Take 1 tablet (150 mg total) by mouth daily.   busPIRone 10 MG tablet Commonly known as: BUSPAR TAKE 2 TABLETS(20 MG) BY MOUTH TWICE DAILY What changed:   how much to take  how to  take this  when to take this  additional instructions   calcitRIOL 0.25 MCG capsule Commonly known as: ROCALTROL Take 0.25 mcg by mouth every Monday, Wednesday, and Friday. Monday then Wednesday then Friday   carvedilol 12.5 MG tablet Commonly known as: COREG Take 1 tablet (12.5 mg total) by mouth 2 (two) times daily. What changed: when to take this   finasteride 5 MG tablet Commonly known as: PROSCAR Take 5 mg by mouth daily.   heparin 25000-0.45 UT/250ML-% infusion Inject 1,450 Units/hr into the vein continuous.   insulin aspart 100 UNIT/ML injection Commonly known as: novoLOG Inject 0-5 Units into the skin at bedtime.   insulin aspart 100 UNIT/ML injection Commonly known as: novoLOG Inject 0-9 Units into the skin 3 (three) times daily with meals.   isosorbide mononitrate 30 MG 24 hr tablet Commonly known as: IMDUR Take 3 tablets (90 mg total) by mouth daily.   nitroGLYCERIN 0.2 mg/mL infusion Inject 0-200 mcg/min into the vein continuous.   nitroGLYCERIN 0.4 MG SL tablet Commonly known as: NITROSTAT Place 1 tablet (0.4 mg total) under the tongue every 5 (five) minutes as needed. Maximum of 3 doses. What changed: reasons to take this   predniSONE 50 MG tablet Commonly known as: DELTASONE Take 1 tablet at 11 pm on night before procedure, take 1 tablet at 5 am the morning of procedure and take 1 tablet at 9:30 AM the morning of procedure   sertraline 100 MG tablet Commonly known as: ZOLOFT Take 1 tablet (100 mg total) by mouth daily.   simvastatin 20 MG tablet Commonly known as: ZOCOR TAKE 1 TABLET(20 MG) BY MOUTH DAILY AT 6 PM What changed: See the new instructions.   tamsulosin 0.4 MG Caps capsule Commonly known as: FLOMAX Take 1 capsule by mouth daily What changed:   how much to take  how to take this  when to take this  additional instructions   torsemide 20 MG tablet Commonly known as: Demadex Take 2 tablets (40 mg total) by mouth daily.        Allergies  Allergen Reactions  . Contrast Media [Iodinated Diagnostic Agents] Rash and Other (See Comments)    Got very hot and red   . Glipizide Other (See Comments)    ANTIDIABETICS. Burning  . Hydroxychloroquine Other (See Comments)    Stomach upset  . Metrizamide Other (See Comments)    Got very hot and red  . Penicillins Hives and Swelling    Has patient had a PCN reaction causing immediate rash, facial/tongue/throat swelling, SOB or lightheadedness with hypotension: yes Has patient had a PCN reaction causing severe rash involving mucus membranes or skin necrosis: no  Has patient had a PCN reaction that required hospitalization: yes Has patient had a PCN reaction occurring within the last 10 years: no If all of the above answers are "NO", then may proceed with Cephalosporin use.     Consultations:  Cardiology    Procedures/Studies: CARDIAC CATHETERIZATION  Result Date: 03/21/2020  Colon [place] Cx to Prox Cx  lesion is 85% stenosed.  Mid Cx to Dist Cx lesion is 100% stenosed.  1st Mrg lesion is 70% stenosed.  Prox LAD to Mid LAD lesion is 90% stenosed.  Ost RCA to Prox RCA lesion is 99% stenosed.  SVG.  The graft exhibits mild diffuse disease.  Left radial artery graft was visualized by angiography.  Origin lesion is 100% stenosed.  LIMA graft was visualized by non-selective angiography and is normal in caliber.  The graft exhibits no disease.  1.  Significant underlying three-vessel coronary artery disease with patent LIMA to LAD and SVG to OM 3.  Radial graft to RCA is occluded at the ostium with critical stenosis of the ostial native right coronary artery.  This is the likely culprit for recent non-STEMI and current unstable angina. 2.  Severe aortic stenosis with peak gradient of 27.4 mmHg and valve area of 0.87. 3.  Right heart catheterization showed moderately to severely elevated filling pressures with pulmonary wedge pressure of 31 mmHg, moderate pulmonary hypertension  at 50/30 mmHg and normal cardiac output at 5.06 with a cardiac index of 2.36.  Prominent V waves on wedge pressure tracing suggestive of significant mitral regurgitation. Recommendations: This is an overall difficult case.  The patient will need to be evaluated for TAVR plus RCA PCI.  Advanced chronic kidney disease and anemia in addition to significant heart failure make timing of this difficult.  The patient will likely need to be initiated on hemodialysis in order to improve his volume status.  He needs blood transfusion to keep his hemoglobin above 9.  Nonetheless, he might not be able to tolerate large volume shifts due to significant coronary artery disease and aortic stenosis. I will discuss with our structural heart team.  It might be best to transfer the patient to [facility]. Vascular access on this patient is very difficult due to extensive stenting and calcifications of the lower extremities.  The SFA stent on the right side might be extending into the common femoral artery.  Arterial access might be easier via the left femoral artery.   DG Chest Portable 1 View  Result Date: 03/20/2020 CLINICAL DATA:  Chest pain. EXAM: PORTABLE CHEST 1 VIEW COMPARISON:  February 15, 2020 FINDINGS: No pneumothorax. Stable cardiomegaly. Diffuse interstitial opacities are identified. No nodules or masses. No focal infiltrates. IMPRESSION: Diffuse interstitial opacities suggestive of pulmonary edema given cardiomegaly. Atypical infection could have a similar appearance. Recommend clinical correlation. Electronically Signed   By: Dorise Bullion III M.D   On: 03/20/2020 08:08       Subjective: Patient seen this afternoon, resting in bed.  Reports he's feeling well.  Denies active chest pain or shortness of breath.  No other acute complaints.    Discharge Exam: Vitals:   03/21/20 1200 03/21/20 1225  BP: 128/79 116/65  Pulse: 88 84  Resp: 18 18  Temp:  97.6 F (36.4 C)  SpO2: 96% 98%   Vitals:    03/21/20 1100 03/21/20 1130 03/21/20 1200 03/21/20 1225  BP: 122/79 (!) 112/55 128/79 116/65  Pulse: 89 86 88 84  Resp: (!) 21 16 18 18   Temp:    97.6 F (36.4 C)  TempSrc:    Oral  SpO2: 93% 97% 96% 98%  Weight:      Height:        General: Pt is alert, awake, not in acute distress Cardiovascular: RRR, S1/S2 +, no rubs, no gallops Respiratory: CTA bilaterally, no wheezing, no rhonchi Abdominal: Soft, NT,  ND, bowel sounds + Extremities: no edema, no cyanosis    The results of significant diagnostics from this hospitalization (including imaging, microbiology, ancillary and laboratory) are listed below for reference.     Microbiology: Recent Results (from the past 240 hour(s))  SARS CORONAVIRUS 2 (TAT 6-24 HRS) Nasopharyngeal Nasopharyngeal Swab     Status: None   Collection Time: 03/14/20  3:40 PM   Specimen: Nasopharyngeal Swab  Result Value Ref Range Status   SARS Coronavirus 2 NEGATIVE NEGATIVE Final    Comment: (NOTE) SARS-CoV-2 target nucleic acids are NOT DETECTED.  The SARS-CoV-2 RNA is generally detectable in upper and lower respiratory specimens during the acute phase of infection. Negative results do not preclude SARS-CoV-2 infection, do not rule out co-infections with other pathogens, and should not be used as the sole basis for treatment or other patient management decisions. Negative results must be combined with clinical observations, patient history, and epidemiological information. The expected result is Negative.  Fact Sheet for Patients: SugarRoll.be  Fact Sheet for Healthcare Providers: https://www.woods-mathews.com/  This test is not yet approved or cleared by the [place] FDA and  has been authorized for detection and/or diagnosis of SARS-CoV-2 by FDA under an Emergency Use Authorization (EUA). This EUA will remain  in effect (meaning this test can be used) for the duration of the COVID-19 declaration  under Se ction 564(b)(1) of the Act, 21 U.S.C. section 360bbb-3(b)(1), unless the authorization is terminated or revoked sooner.  Performed at [facility], [place] [address], [place], [place] [address]   SARS Coronavirus 2 by RT PCR (hospital order, performed in [facility] hospital lab) Nasopharyngeal Nasopharyngeal Swab     Status: None   Collection Time: 03/20/20  7:28 AM   Specimen: Nasopharyngeal Swab  Result Value Ref Range Status   SARS Coronavirus 2 NEGATIVE NEGATIVE Final    Comment: (NOTE) SARS-CoV-2 target nucleic acids are NOT DETECTED.  The SARS-CoV-2 RNA is generally detectable in upper and lower respiratory specimens during the acute phase of infection. The lowest concentration of SARS-CoV-2 viral copies this assay can detect is 250 copies / mL. A negative result does not preclude SARS-CoV-2 infection and should not be used as the sole basis for treatment or other patient management decisions.  A negative result may occur with improper specimen collection / handling, submission of specimen other than nasopharyngeal swab, presence of viral mutation(s) within the areas targeted by this assay, and inadequate number of viral copies (<250 copies / mL). A negative result must be combined with clinical observations, patient history, and epidemiological information.  Fact Sheet for Patients:   StrictlyIdeas.no  Fact Sheet for Healthcare Providers: BankingDealers.co.za  This test is not yet approved or  cleared by the [place] FDA and has been authorized for detection and/or diagnosis of SARS-CoV-2 by FDA under an Emergency Use Authorization (EUA).  This EUA will remain in effect (meaning this test can be used) for the duration of the COVID-19 declaration under Section 564(b)(1) of the Act, 21 U.S.C. section 360bbb-3(b)(1), unless the authorization is terminated or revoked sooner.  Performed at [facility], [address] [place], [place] [address]      Labs: BNP (last 3 results) Recent Labs    02/13/20 0033 03/20/20 0620  BNP 713.3* 481.8*   Basic Metabolic Panel: Recent Labs  Lab 03/15/20 0834 03/20/20 0620 03/20/20 0919  NA 138 138  --   K 4.6 3.8  --   CL 100 105  --  CO2 26 20*  --   GLUCOSE 165* 178*  --   BUN 52* 63*  --   CREATININE 4.32* 4.21* 4.58*  CALCIUM 8.7* 7.8*  --   MG  --  2.2  --    Liver Function Tests: No results for input(s): AST, ALT, ALKPHOS, BILITOT, PROT, ALBUMIN in the last 168 hours. No results for input(s): LIPASE, AMYLASE in the last 168 hours. No results for input(s): AMMONIA in the last 168 hours. CBC: Recent Labs  Lab 03/15/20 0834 03/20/20 0620 03/21/20 0341  WBC 6.2 6.6 5.9  NEUTROABS  --  4.3  --   HGB 8.8* 8.0* 7.8*  HCT 28.0* 24.3* 23.9*  MCV 101.1* 97.6 98.8  PLT 131* 125* 125*   Cardiac Enzymes: No results for input(s): CKTOTAL, CKMB, CKMBINDEX, TROPONINI in the last 168 hours. BNP: Invalid input(s): POCBNP CBG: Recent Labs  Lab 03/20/20 1641 03/20/20 2044 03/21/20 0728 03/21/20 1011 03/21/20 1227  GLUCAP 169* 228* 187* 194* 200*   D-Dimer No results for input(s): DDIMER in the last 72 hours. Hgb A1c No results for input(s): HGBA1C in the last 72 hours. Lipid Profile No results for input(s): CHOL, HDL, LDLCALC, TRIG, CHOLHDL, LDLDIRECT in the last 72 hours. Thyroid function studies No results for input(s): TSH, T4TOTAL, T3FREE, THYROIDAB in the last 72 hours.  Invalid input(s): FREET3 Anemia work up Recent Labs    03/20/20 1358  VITAMINB12 281  FOLATE 10.1  FERRITIN 32  TIBC 382  IRON 138  RETICCTPCT 3.6*   Urinalysis    Component Value Date/Time   COLORURINE YELLOW (A) 02/08/2020 1106   APPEARANCEUR CLEAR (A) 02/08/2020 1106   APPEARANCEUR Clear 06/13/2015 1012   LABSPEC 1.011 02/08/2020 1106   PHURINE 7.0 02/08/2020 1106   GLUCOSEU NEGATIVE 02/08/2020 1106   HGBUR NEGATIVE  02/08/2020 1106   [place] 02/08/2020 1106   BILIRUBINUR Negative 06/13/2015 1012   KETONESUR NEGATIVE 02/08/2020 1106   PROTEINUR 100 (A) 02/08/2020 1106   UROBILINOGEN 0.2 12/21/2014 1336   NITRITE NEGATIVE 02/08/2020 1106   LEUKOCYTESUR NEGATIVE 02/08/2020 1106   Sepsis Labs Invalid input(s): PROCALCITONIN,  WBC,  LACTICIDVEN Microbiology Recent Results (from the past 240 hour(s))  SARS CORONAVIRUS 2 (TAT 6-24 HRS) Nasopharyngeal Nasopharyngeal Swab     Status: None   Collection Time: 03/14/20  3:40 PM   Specimen: Nasopharyngeal Swab  Result Value Ref Range Status   SARS Coronavirus 2 NEGATIVE NEGATIVE Final    Comment: (NOTE) SARS-CoV-2 target nucleic acids are NOT DETECTED.  The SARS-CoV-2 RNA is generally detectable in upper and lower respiratory specimens during the acute phase of infection. Negative results do not preclude SARS-CoV-2 infection, do not rule out co-infections with other pathogens, and should not be used as the sole basis for treatment or other patient management decisions. Negative results must be combined with clinical observations, patient history, and epidemiological information. The expected result is Negative.  Fact Sheet for Patients: SugarRoll.be  Fact Sheet for Healthcare Providers: https://www.woods-mathews.com/  This test is not yet approved or cleared by the [place] FDA and  has been authorized for detection and/or diagnosis of SARS-CoV-2 by FDA under an Emergency Use Authorization (EUA). This EUA will remain  in effect (meaning this test can be used) for the duration of the COVID-19 declaration under Se ction 564(b)(1) of the Act, 21 U.S.C. section 360bbb-3(b)(1), unless the authorization is terminated or revoked sooner.  Performed at [facility], [place] [address] [place], [place] [address]  SARS Coronavirus 2 by RT PCR (hospital order, performed in [facility] hospital  lab) Nasopharyngeal Nasopharyngeal Swab     Status: None   Collection Time: 03/20/20  7:28 AM   Specimen: Nasopharyngeal Swab  Result Value Ref Range Status   SARS Coronavirus 2 NEGATIVE NEGATIVE Final    Comment: (NOTE) SARS-CoV-2 target nucleic acids are NOT DETECTED.  The SARS-CoV-2 RNA is generally detectable in upper and lower respiratory specimens during the acute phase of infection. The lowest concentration of SARS-CoV-2 viral copies this assay can detect is 250 copies / mL. A negative result does not preclude SARS-CoV-2 infection and should not be used as the sole basis for treatment or other patient management decisions.  A negative result may occur with improper specimen collection / handling, submission of specimen other than nasopharyngeal swab, presence of viral mutation(s) within the areas targeted by this assay, and inadequate number of viral copies (<250 copies / mL). A negative result must be combined with clinical observations, patient history, and epidemiological information.  Fact Sheet for Patients:   StrictlyIdeas.no  Fact Sheet for Healthcare Providers: BankingDealers.co.za  This test is not yet approved or  cleared by the [place] FDA and has been authorized for detection and/or diagnosis of SARS-CoV-2 by FDA under an Emergency Use Authorization (EUA).  This EUA will remain in effect (meaning this test can be used) for the duration of the COVID-19 declaration under Section 564(b)(1) of the Act, 21 U.S.C. section 360bbb-3(b)(1), unless the authorization is terminated or revoked sooner.  Performed at [facility], [address] [place], [place] [address]      Time coordinating discharge: Over 30 minutes  SIGNED:   Ezekiel Slocumb, DO [facility] 03/21/2020, 2:12 PM   If 7PM-7AM, please contact night-coverage [email or web address]

## 2020-03-21 NOTE — Progress Notes (Signed)
Patient is complaining of chest pain 7/10, increased nitroglycerin drip to 41mcg.

## 2020-03-21 NOTE — Telephone Encounter (Signed)
Sonya left v/m that she has pt on schedule to be seen on 03/22/20 but needs VO before she can see pt; [place] notified as instructed from 03/18/20 phone note but I also advised Davy Pique she might want to ck to see if pt is at home tomorrow due to chart review tab has pt was admitted to [facility] on 03/20/20. Sonya voiced understanding and appreciative.

## 2020-03-21 NOTE — Consult Note (Signed)
ANTICOAGULATION CONSULT NOTE - Initial Consult  Pharmacy Consult for heparin dosing Indication: chest pain/ACS  Allergies  Allergen Reactions  . Contrast Media [Iodinated Diagnostic Agents] Rash and Other (See Comments)    Got very hot and red   . Glipizide Other (See Comments)    ANTIDIABETICS. Burning  . Hydroxychloroquine Other (See Comments)    Stomach upset  . Metrizamide Other (See Comments)    Got very hot and red  . Penicillins Hives and Swelling    Has patient had a PCN reaction causing immediate rash, facial/tongue/throat swelling, SOB or lightheadedness with hypotension: yes Has patient had a PCN reaction causing severe rash involving mucus membranes or skin necrosis: no  Has patient had a PCN reaction that required hospitalization: yes Has patient had a PCN reaction occurring within the last 10 years: no If all of the above answers are "NO", then may proceed with Cephalosporin use.     Patient Measurements: Height: 5\' 6"  (167.6 cm) Weight: 106 kg (233 lb 11.2 oz) IBW/kg (Calculated) : 63.8 Heparin Dosing Weight: 86.4  Vital Signs: Temp: 97.5 F (36.4 C) (08/29 2327) Temp Source: Oral (08/29 2327) BP: 155/83 (08/30 0458) Pulse Rate: 90 (08/30 0458)  Labs: Recent Labs    03/20/20 0620 03/20/20 0827 03/20/20 0919 03/20/20 1255 03/20/20 1752 03/21/20 0341  HGB 8.0*  --   --   --   --  7.8*  HCT 24.3*  --   --   --   --  23.9*  PLT 125*  --   --   --   --  125*  APTT 34  --   --   --   --   --   LABPROT 14.0  --   --   --   --   --   INR 1.1  --   --   --   --   --   HEPARINUNFRC  --   --   --   --  0.17* 0.55  CREATININE 4.21*  --  4.58*  --   --   --   TROPONINIHS 19* 51*  --  86*  --   --     Estimated Creatinine Clearance: 18.1 mL/min (A) (by C-G formula based on SCr of 4.58 mg/dL (H)).   Medical History: Past Medical History:  Diagnosis Date  . Adenomatous colon polyp   . Allergy   . Angina at [facility])    chronic  . Anxiety   .  Arthritis    RA  . Bell's palsy 2007  . Carotid artery occlusion   . Cataract    Dr. Dawna Part  . CHF (congestive heart failure) ([place])   . Coronary artery disease   . Depression   . Diabetes mellitus   . Diverticulosis   . Duodenitis   . DVT (deep venous thrombosis) (H[facility])    in leg  . Dyspnea    DOE  . Gastropathy   . GERD (gastroesophageal reflux disease)   . Heart murmur   . Helicobacter pylori gastritis   . Hyperlipidemia   . Hypertension   . Kidney failure   . Mild aortic stenosis   . Neuropathy   . Obesity   . Peripheral vascular disease ([place])   . Post splenectomy syndrome   . Psoriasis   . Psoriatic arthritis ([place])   . Renal disorder    stage 4 kidney failure  . Sleep apnea    uses cpap  . SOB (shortness  of breath)   . Stroke ([place])   . Tobacco use disorder    recently quit  . Tubular adenoma 08/2013   Dr. Hilarie Fredrickson  . Weak urinary stream     Medications:  Scheduled:  . amLODipine  2.5 mg Oral Daily  . aspirin EC  81 mg Oral Daily  . busPIRone  20 mg Oral BID  . calcitRIOL  0.25 mcg Oral Q M,W,F  . carvedilol  12.5 mg Oral Daily  . finasteride  5 mg Oral Daily  . [START ON 03/22/2020] furosemide  40 mg Intravenous BID  . insulin aspart  0-5 Units Subcutaneous QHS  . insulin aspart  0-9 Units Subcutaneous TID WC  . predniSONE  50 mg Oral Daily  . simvastatin  20 mg Oral QPM  . tamsulosin  0.4 mg Oral Daily    Assessment: 64 year old male presenting with exertional shortness of breath and chest pain. PMH includes CAD s/p CABG (2012), diabetes, CKD stage IV, HTN, HLD, PAD, and psoriasis. Pt will not receive heparin bolus per MD. Hgb 10.1 (02/23/20) >8.8>8.0. Plt 173 (02/23/20) >131>125.   Goal of Therapy:  Heparin level 0.3-0.7 units/ml Monitor platelets by anticoagulation protocol: Yes   Plan:  08/30 @ 0345 HL 0.55 therapeutic. Will continue current rate at 1450 units/hr and will recheck HL at 1200, CBC low stable will continue to monitor.  Tobie Lords,  PharmD, BCPS Clinical Pharmacist 03/21/2020 5:56 AM

## 2020-03-21 NOTE — Progress Notes (Signed)
[facility]  ROUNDING NOTE   Subjective:   Cardiac catheterization today. Cardiology is planning transfer to [facility] for further work up   Patient complains of chest pain and heart burn.   Objective:  Vital signs in last 24 hours:  Temp:  [97.4 F (36.3 C)-97.7 F (36.5 C)] 97.7 F (36.5 C) (08/30 1541) Pulse Rate:  [84-102] 102 (08/30 1541) Resp:  [16-24] 17 (08/30 1541) BP: (97-155)/(55-95) 129/69 (08/30 1541) SpO2:  [93 %-100 %] 100 % (08/30 1541) Weight:  [106 kg] 106 kg (08/30 0719)  Weight change: 3.946 kg Filed Weights   03/20/20 0626 03/20/20 1238 03/21/20 0719  Weight: 102.1 kg 106 kg 106 kg    Intake/Output: I/O last 3 completed shifts: In: 1422.4 [P.O.:1080; I.V.:342.4] Out: 1552 [PIRJJ:8841]   Intake/Output this shift:  Total I/O In: -  Out: 500 [Urine:500]  Physical Exam: General: NAD,   Head: Normocephalic, atraumatic. Moist oral mucosal membranes  Eyes: Anicteric, PERRL  Neck: Supple, trachea midline  Lungs:  Clear to auscultation  Heart: Regular rate and rhythm  Abdomen:  Soft, nontender,   Extremities: no peripheral edema.  Neurologic: Nonfocal, moving all four extremities  Skin: No lesions  Access: none    Basic Metabolic Panel: Recent Labs  Lab 03/15/20 0834 03/20/20 0620 03/20/20 0919  NA 138 138  --   K 4.6 3.8  --   CL 100 105  --   CO2 26 20*  --   GLUCOSE 165* 178*  --   BUN 52* 63*  --   CREATININE 4.32* 4.21* 4.58*  CALCIUM 8.7* 7.8*  --   MG  --  2.2  --     Liver Function Tests: No results for input(s): AST, ALT, ALKPHOS, BILITOT, PROT, ALBUMIN in the last 168 hours. No results for input(s): LIPASE, AMYLASE in the last 168 hours. No results for input(s): AMMONIA in the last 168 hours.  CBC: Recent Labs  Lab 03/15/20 0834 03/20/20 0620 03/21/20 0341  WBC 6.2 6.6 5.9  NEUTROABS  --  4.3  --   HGB 8.8* 8.0* 7.8*  HCT 28.0* 24.3* 23.9*  MCV 101.1* 97.6 98.8  PLT 131* 125* 125*     Cardiac Enzymes: No results for input(s): CKTOTAL, CKMB, CKMBINDEX, TROPONINI in the last 168 hours.  BNP: Invalid input(s): POCBNP  CBG: Recent Labs  Lab 03/20/20 1641 03/20/20 2044 03/21/20 0728 03/21/20 1011 03/21/20 1227  GLUCAP 169* 228* 187* 194* 200*    Microbiology: Results for orders placed or performed during the hospital encounter of 03/20/20  SARS Coronavirus 2 by RT PCR (hospital order, performed in [facility] hospital lab) Nasopharyngeal Nasopharyngeal Swab     Status: None   Collection Time: 03/20/20  7:28 AM   Specimen: Nasopharyngeal Swab  Result Value Ref Range Status   SARS Coronavirus 2 NEGATIVE NEGATIVE Final    Comment: (NOTE) SARS-CoV-2 target nucleic acids are NOT DETECTED.  The SARS-CoV-2 RNA is generally detectable in upper and lower respiratory specimens during the acute phase of infection. The lowest concentration of SARS-CoV-2 viral copies this assay can detect is 250 copies / mL. A negative result does not preclude SARS-CoV-2 infection and should not be used as the sole basis for treatment or other patient management decisions.  A negative result may occur with improper specimen collection / handling, submission of specimen other than nasopharyngeal swab, presence of viral mutation(s) within the areas targeted by this assay, and inadequate number of viral copies (<250 copies /  mL). A negative result must be combined with clinical observations, patient history, and epidemiological information.  Fact Sheet for Patients:   StrictlyIdeas.no  Fact Sheet for Healthcare Providers: BankingDealers.co.za  This test is not yet approved or  cleared by the [place] FDA and has been authorized for detection and/or diagnosis of SARS-CoV-2 by FDA under an Emergency Use Authorization (EUA).  This EUA will remain in effect (meaning this test can be used) for the duration of the COVID-19 declaration  under Section 564(b)(1) of the Act, 21 U.S.C. section 360bbb-3(b)(1), unless the authorization is terminated or revoked sooner.  Performed at [facility], [address] [place], [place] [address]     Coagulation Studies: Recent Labs    03/20/20 0620  LABPROT 14.0  INR 1.1    Urinalysis: No results for input(s): COLORURINE, LABSPEC, PHURINE, GLUCOSEU, HGBUR, BILIRUBINUR, KETONESUR, PROTEINUR, UROBILINOGEN, NITRITE, LEUKOCYTESUR in the last 72 hours.  Invalid input(s): APPERANCEUR    Imaging: CARDIAC CATHETERIZATION  Result Date: 03/21/2020  Colon Flattery Cx to Prox Cx lesion is 85% stenosed.  Mid Cx to Dist Cx lesion is 100% stenosed.  1st Mrg lesion is 70% stenosed.  Prox LAD to Mid LAD lesion is 90% stenosed.  Ost RCA to Prox RCA lesion is 99% stenosed.  SVG.  The graft exhibits mild diffuse disease.  Left radial artery graft was visualized by angiography.  Origin lesion is 100% stenosed.  LIMA graft was visualized by non-selective angiography and is normal in caliber.  The graft exhibits no disease.  1.  Significant underlying three-vessel coronary artery disease with patent LIMA to LAD and SVG to OM 3.  Radial graft to RCA is occluded at the ostium with critical stenosis of the ostial native right coronary artery.  This is the likely culprit for recent non-STEMI and current unstable angina. 2.  Severe aortic stenosis with peak gradient of 27.4 mmHg and valve area of 0.87. 3.  Right heart catheterization showed moderately to severely elevated filling pressures with pulmonary wedge pressure of 31 mmHg, moderate pulmonary hypertension at 50/30 mmHg and normal cardiac output at 5.06 with a cardiac index of 2.36.  Prominent V waves on wedge pressure tracing suggestive of significant mitral regurgitation. Recommendations: This is an overall difficult case.  The patient will need to be evaluated for TAVR plus RCA PCI.  Advanced chronic kidney disease and anemia in addition to  significant heart failure make timing of this difficult.  The patient will likely need to be initiated on hemodialysis in order to improve his volume status.  He needs blood transfusion to keep his hemoglobin above 9.  Nonetheless, he might not be able to tolerate large volume shifts due to significant coronary artery disease and aortic stenosis. I will discuss with our structural heart team.  It might be best to transfer the patient to [facility] Vascular access on this patient is very difficult due to extensive stenting and calcifications of the lower extremities.  The SFA stent on the right side might be extending into the common femoral artery.  Arterial access might be easier via the left femoral artery.   DG Chest Portable 1 View  Result Date: 03/20/2020 CLINICAL DATA:  Chest pain. EXAM: PORTABLE CHEST 1 VIEW COMPARISON:  February 15, 2020 FINDINGS: No pneumothorax. Stable cardiomegaly. Diffuse interstitial opacities are identified. No nodules or masses. No focal infiltrates. IMPRESSION: Diffuse interstitial opacities suggestive of pulmonary edema given cardiomegaly. Atypical infection could have a similar appearance. Recommend clinical correlation. Electronically Signed  By: Dorise Bullion III M.D   On: 03/20/2020 08:08     Medications:    sodium chloride     heparin     nitroGLYCERIN 11 mcg/min (03/20/20 2346)    amLODipine  2.5 mg Oral Daily   aspirin EC  81 mg Oral Daily   busPIRone  20 mg Oral BID   calcitRIOL  0.25 mcg Oral Q M,W,F   carvedilol  12.5 mg Oral Daily   clopidogrel       diphenhydrAMINE  50 mg Intravenous Once   finasteride  5 mg Oral Daily   [START ON 03/22/2020] furosemide  40 mg Intravenous BID   insulin aspart  0-5 Units Subcutaneous QHS   insulin aspart  0-9 Units Subcutaneous TID WC   predniSONE  50 mg Oral Daily   simvastatin  20 mg Oral QPM   sodium chloride flush  3 mL Intravenous Q12H   sodium chloride flush  3 mL Intravenous  Q12H   tamsulosin  0.4 mg Oral Daily   sodium chloride, acetaminophen **OR** acetaminophen, alum & mag hydroxide-simeth, morphine injection, ondansetron **OR** ondansetron (ZOFRAN) IV, polyethylene glycol, sodium chloride flush  Assessment/ Plan:  Mr. TANNEN VANDEZANDE is a 64 y.o. white male peripheral vascular disease, diabetes mellitus type 2 insulin dependent, hypertension, major depressive disorder, tobacco use, psoriasis, diabetic neuropathy, splenectomy, carotid artery stenosis, coronary artery disease, hyperlipidemia, obstructive sleep apnea, history of DVT, CVA who is admitted to [facility] on 03/20/2020 for Unstable angina ([place]) [I20.0] Abnormal ECG [R94.31] QT prolongation [R94.31] Low hemoglobin [D64.9] Stage 4 chronic kidney disease ([place]) [N18.4] Patient underwent diagnostic cardiac catheterization on 8/30 by Dr. Fletcher Anon.   1. Chronic Kidney Disease stage V with proteinuria and hyperkalemia: chronic kidney disease secondary to diabetic nephropathy, NSAID induced nephropathy and hypertensive nephrosclerosis. No indication for dialysis today.  - outpatient dialysis planning: if patient needs chronic dialysis, patient prefers [facility] in [place].  - Currently holding losartan   2. Hypertension: Well controlled on nitroglycerin infusion. Holding furosemide.  Continue carvedilol and tamsulosin   3. Anemia of chronic kidney disease: hemoglobin 7.8. Will need PRBC transfusion for acute coronary syndrome.   4. Secondary Hyperparathyroidism: PTH 118 on 8/19. - Continue calcitriol    LOS: 1 Damen Windsor 8/30/20214:59 PM

## 2020-03-21 NOTE — Progress Notes (Signed)
Patient arrived to unit, patient is on a heparin drip at 14.28mLhr and a nitro drip. Cardiac monitor initiated and verified. No complaints from patient. Provider notified of patients arrival. Will continue to monitor.

## 2020-03-21 NOTE — Hospital Course (Signed)
From H&P by Dr. [name] on Admission: "[name] [name]  is a 64 y.o. male with a known history of coronary artery disease status post CABG in 2012, moderate aortic stenosis, carotid artery stenosis status post left-sided CEA, type II diabetes on insulin, CKD stage IV, hypertension, hyper hyperlipidemia, PAD status post extensive lower extremity revascularization by surgery, obesity comes to the emergency room with increasing exertional shortness of breath and chest pain. ED course: afebrile blood pressure 134/81 normal sinus rhythm on EKG heart rate 96 sats hundred percent on room air troponin 15-- 19 BNP 503 hemoglobin 8.0 creatinine 4.2 (baseline creatinine 3.5-3.7)"

## 2020-03-22 ENCOUNTER — Encounter (HOSPITAL_COMMUNITY): Payer: Self-pay | Admitting: Cardiovascular Disease

## 2020-03-22 ENCOUNTER — Other Ambulatory Visit: Payer: Self-pay

## 2020-03-22 ENCOUNTER — Other Ambulatory Visit (INDEPENDENT_AMBULATORY_CARE_PROVIDER_SITE_OTHER): Payer: Self-pay | Admitting: Nurse Practitioner

## 2020-03-22 ENCOUNTER — Inpatient Hospital Stay (HOSPITAL_COMMUNITY): Payer: Medicare Other

## 2020-03-22 DIAGNOSIS — E782 Mixed hyperlipidemia: Secondary | ICD-10-CM

## 2020-03-22 DIAGNOSIS — I5032 Chronic diastolic (congestive) heart failure: Secondary | ICD-10-CM

## 2020-03-22 DIAGNOSIS — I340 Nonrheumatic mitral (valve) insufficiency: Secondary | ICD-10-CM

## 2020-03-22 DIAGNOSIS — I739 Peripheral vascular disease, unspecified: Secondary | ICD-10-CM

## 2020-03-22 DIAGNOSIS — I10 Essential (primary) hypertension: Secondary | ICD-10-CM

## 2020-03-22 DIAGNOSIS — Z951 Presence of aortocoronary bypass graft: Secondary | ICD-10-CM

## 2020-03-22 DIAGNOSIS — I5031 Acute diastolic (congestive) heart failure: Secondary | ICD-10-CM

## 2020-03-22 DIAGNOSIS — E118 Type 2 diabetes mellitus with unspecified complications: Secondary | ICD-10-CM

## 2020-03-22 DIAGNOSIS — I359 Nonrheumatic aortic valve disorder, unspecified: Secondary | ICD-10-CM

## 2020-03-22 DIAGNOSIS — I351 Nonrheumatic aortic (valve) insufficiency: Secondary | ICD-10-CM

## 2020-03-22 DIAGNOSIS — I214 Non-ST elevation (NSTEMI) myocardial infarction: Principal | ICD-10-CM

## 2020-03-22 DIAGNOSIS — I2510 Atherosclerotic heart disease of native coronary artery without angina pectoris: Secondary | ICD-10-CM | POA: Diagnosis present

## 2020-03-22 DIAGNOSIS — I350 Nonrheumatic aortic (valve) stenosis: Secondary | ICD-10-CM

## 2020-03-22 LAB — CBC WITH DIFFERENTIAL/PLATELET
Abs Immature Granulocytes: 0.05 10*3/uL (ref 0.00–0.07)
Basophils Absolute: 0 10*3/uL (ref 0.0–0.1)
Basophils Relative: 0 %
Eosinophils Absolute: 0.1 10*3/uL (ref 0.0–0.5)
Eosinophils Relative: 2 %
HCT: 22.5 % — ABNORMAL LOW (ref 39.0–52.0)
Hemoglobin: 7.1 g/dL — ABNORMAL LOW (ref 13.0–17.0)
Immature Granulocytes: 1 %
Lymphocytes Relative: 19 %
Lymphs Abs: 1.6 10*3/uL (ref 0.7–4.0)
MCH: 32 pg (ref 26.0–34.0)
MCHC: 31.6 g/dL (ref 30.0–36.0)
MCV: 101.4 fL — ABNORMAL HIGH (ref 80.0–100.0)
Monocytes Absolute: 0.7 10*3/uL (ref 0.1–1.0)
Monocytes Relative: 8 %
Neutro Abs: 6.2 10*3/uL (ref 1.7–7.7)
Neutrophils Relative %: 70 %
Platelets: 135 10*3/uL — ABNORMAL LOW (ref 150–400)
RBC: 2.22 MIL/uL — ABNORMAL LOW (ref 4.22–5.81)
RDW: 14.3 % (ref 11.5–15.5)
WBC: 8.7 10*3/uL (ref 4.0–10.5)
nRBC: 0 % (ref 0.0–0.2)

## 2020-03-22 LAB — HEPARIN LEVEL (UNFRACTIONATED): Heparin Unfractionated: 0.59 IU/mL (ref 0.30–0.70)

## 2020-03-22 LAB — BASIC METABOLIC PANEL
Anion gap: 12 (ref 5–15)
BUN: 82 mg/dL — ABNORMAL HIGH (ref 8–23)
CO2: 23 mmol/L (ref 22–32)
Calcium: 8.5 mg/dL — ABNORMAL LOW (ref 8.9–10.3)
Chloride: 99 mmol/L (ref 98–111)
Creatinine, Ser: 4.47 mg/dL — ABNORMAL HIGH (ref 0.61–1.24)
GFR calc Af Amer: 15 mL/min — ABNORMAL LOW (ref >60)
GFR calc non Af Amer: 13 mL/min — ABNORMAL LOW (ref >60)
Glucose, Bld: 210 mg/dL — ABNORMAL HIGH (ref 70–99)
Potassium: 3.9 mmol/L (ref 3.5–5.1)
Sodium: 134 mmol/L — ABNORMAL LOW (ref 135–145)

## 2020-03-22 LAB — ECHOCARDIOGRAM COMPLETE
AR max vel: 1.15 cm2
AV Area VTI: 1.08 cm2
AV Area mean vel: 1.19 cm2
AV Mean grad: 22 mmHg
AV Peak grad: 34.9 mmHg
Ao pk vel: 2.95 m/s
Area-P 1/2: 2.82 cm2
Calc EF: 28.4 %
Height: 66 in
MV M vel: 5.36 m/s
MV Peak grad: 114.9 mmHg
Radius: 0.3 cm
S' Lateral: 6.2 cm
Single Plane A2C EF: 41.7 %
Single Plane A4C EF: 17.9 %
Weight: 3742.4 oz

## 2020-03-22 LAB — GLUCOSE, CAPILLARY
Glucose-Capillary: 186 mg/dL — ABNORMAL HIGH (ref 70–99)
Glucose-Capillary: 217 mg/dL — ABNORMAL HIGH (ref 70–99)
Glucose-Capillary: 155 mg/dL — ABNORMAL HIGH (ref 70–99)
Glucose-Capillary: 163 mg/dL — ABNORMAL HIGH (ref 70–99)
Glucose-Capillary: 152 mg/dL — ABNORMAL HIGH (ref 70–99)

## 2020-03-22 LAB — HEMOGLOBIN AND HEMATOCRIT, BLOOD
HCT: 27 % — ABNORMAL LOW (ref 39.0–52.0)
Hemoglobin: 8.5 g/dL — ABNORMAL LOW (ref 13.0–17.0)

## 2020-03-22 LAB — PREPARE RBC (CROSSMATCH)

## 2020-03-22 MED ORDER — BUSPIRONE HCL 5 MG PO TABS
20.0000 mg | ORAL_TABLET | Freq: Two times a day (BID) | ORAL | Status: DC
  Administered 2020-03-22 – 2020-03-25 (×8): 20 mg via ORAL
  Filled 2020-03-22: qty 4
  Filled 2020-03-22: qty 2
  Filled 2020-03-22 (×2): qty 4
  Filled 2020-03-22: qty 2
  Filled 2020-03-22: qty 4
  Filled 2020-03-22: qty 2

## 2020-03-22 MED ORDER — ALBUTEROL SULFATE (2.5 MG/3ML) 0.083% IN NEBU
3.0000 mL | INHALATION_SOLUTION | Freq: Four times a day (QID) | RESPIRATORY_TRACT | Status: DC | PRN
  Administered 2020-03-24 (×2): 3 mL via RESPIRATORY_TRACT
  Filled 2020-03-22 (×2): qty 3

## 2020-03-22 MED ORDER — NITROGLYCERIN IN D5W 200-5 MCG/ML-% IV SOLN
0.0000 ug/min | INTRAVENOUS | Status: DC
  Administered 2020-03-22 (×2): 30 ug/min via INTRAVENOUS
  Filled 2020-03-22: qty 250

## 2020-03-22 MED ORDER — ASPIRIN EC 81 MG PO TBEC: 81.0000 mg | DELAYED_RELEASE_TABLET | Freq: Every day | ORAL | Status: DC

## 2020-03-22 MED ORDER — NITROGLYCERIN 0.4 MG SL SUBL: 0.4000 mg | SUBLINGUAL_TABLET | SUBLINGUAL | Status: DC | PRN

## 2020-03-22 MED ORDER — CLOPIDOGREL BISULFATE 75 MG PO TABS
75.0000 mg | ORAL_TABLET | Freq: Every day | ORAL | Status: DC
  Administered 2020-03-22 – 2020-03-25 (×4): 75 mg via ORAL
  Filled 2020-03-22 (×4): qty 1

## 2020-03-22 MED ORDER — CALCIUM CARBONATE ANTACID 500 MG PO CHEW
250.0000 mg | CHEWABLE_TABLET | Freq: Three times a day (TID) | ORAL | Status: DC
  Administered 2020-03-22 – 2020-03-25 (×7): 250 mg via ORAL
  Filled 2020-03-22 (×6): qty 2

## 2020-03-22 MED ORDER — ALUM & MAG HYDROXIDE-SIMETH 200-200-20 MG/5ML PO SUSP
30.0000 mL | Freq: Four times a day (QID) | ORAL | Status: DC | PRN
  Administered 2020-03-22 – 2020-03-24 (×4): 30 mL via ORAL
  Filled 2020-03-22 (×4): qty 30

## 2020-03-22 MED ORDER — TAMSULOSIN HCL 0.4 MG PO CAPS
0.4000 mg | ORAL_CAPSULE | Freq: Every day | ORAL | Status: DC
  Administered 2020-03-22 – 2020-03-25 (×4): 0.4 mg via ORAL
  Filled 2020-03-22 (×4): qty 1

## 2020-03-22 MED ORDER — PERFLUTREN LIPID MICROSPHERE
1.0000 mL | INTRAVENOUS | Status: AC | PRN
  Administered 2020-03-22: 3 mL via INTRAVENOUS
  Filled 2020-03-22: qty 10

## 2020-03-22 MED ORDER — ASPIRIN 300 MG RE SUPP: 300.0000 mg | RECTAL | Status: DC

## 2020-03-22 MED ORDER — CALCITRIOL 0.25 MCG PO CAPS
0.2500 ug | ORAL_CAPSULE | ORAL | Status: DC
  Administered 2020-03-23 – 2020-03-25 (×2): 0.25 ug via ORAL
  Filled 2020-03-22 (×2): qty 1

## 2020-03-22 MED ORDER — HYDRALAZINE HCL 10 MG PO TABS
10.0000 mg | ORAL_TABLET | Freq: Three times a day (TID) | ORAL | Status: DC
  Administered 2020-03-22 – 2020-03-23 (×3): 10 mg via ORAL
  Filled 2020-03-22 (×3): qty 1

## 2020-03-22 MED ORDER — SODIUM CHLORIDE 0.9% FLUSH: 3.0000 mL | INTRAVENOUS | Status: DC | PRN

## 2020-03-22 MED ORDER — ACETAMINOPHEN 325 MG PO TABS
650.0000 mg | ORAL_TABLET | ORAL | Status: DC | PRN
  Administered 2020-03-22 – 2020-03-24 (×4): 650 mg via ORAL
  Filled 2020-03-22 (×4): qty 2

## 2020-03-22 MED ORDER — ASPIRIN EC 81 MG PO TBEC
81.0000 mg | DELAYED_RELEASE_TABLET | Freq: Every day | ORAL | Status: DC
  Administered 2020-03-22 – 2020-03-25 (×3): 81 mg via ORAL
  Filled 2020-03-22 (×3): qty 1

## 2020-03-22 MED ORDER — INSULIN ASPART 100 UNIT/ML ~~LOC~~ SOLN: 0.0000 [IU] | Freq: Every day | SUBCUTANEOUS | Status: DC

## 2020-03-22 MED ORDER — HYPROMELLOSE (GONIOSCOPIC) 2.5 % OP SOLN
1.0000 [drp] | Freq: Four times a day (QID) | OPHTHALMIC | Status: DC | PRN
  Filled 2020-03-22: qty 15

## 2020-03-22 MED ORDER — FUROSEMIDE 10 MG/ML IJ SOLN: 20.0000 mg | Freq: Once | INTRAMUSCULAR | Status: DC

## 2020-03-22 MED ORDER — SERTRALINE HCL 100 MG PO TABS
100.0000 mg | ORAL_TABLET | Freq: Every day | ORAL | Status: DC
  Filled 2020-03-22: qty 1

## 2020-03-22 MED ORDER — SODIUM CHLORIDE 0.9 % IV SOLN: 250.0000 mL | INTRAVENOUS | Status: DC | PRN

## 2020-03-22 MED ORDER — SERTRALINE HCL 100 MG PO TABS
100.0000 mg | ORAL_TABLET | Freq: Every day | ORAL | Status: DC
  Administered 2020-03-22 – 2020-03-24 (×3): 100 mg via ORAL
  Filled 2020-03-22 (×3): qty 1

## 2020-03-22 MED ORDER — CYCLOBENZAPRINE HCL 10 MG PO TABS
10.0000 mg | ORAL_TABLET | Freq: Three times a day (TID) | ORAL | Status: DC | PRN
  Administered 2020-03-22 – 2020-03-23 (×3): 10 mg via ORAL
  Filled 2020-03-22 (×3): qty 1

## 2020-03-22 MED ORDER — INSULIN ASPART 100 UNIT/ML ~~LOC~~ SOLN
0.0000 [IU] | Freq: Three times a day (TID) | SUBCUTANEOUS | Status: DC
  Administered 2020-03-22: 3 [IU] via SUBCUTANEOUS
  Administered 2020-03-22 (×2): 2 [IU] via SUBCUTANEOUS
  Administered 2020-03-23: 3 [IU] via SUBCUTANEOUS

## 2020-03-22 MED ORDER — FINASTERIDE 5 MG PO TABS
5.0000 mg | ORAL_TABLET | Freq: Every day | ORAL | Status: DC
  Administered 2020-03-22 – 2020-03-25 (×4): 5 mg via ORAL
  Filled 2020-03-22 (×4): qty 1

## 2020-03-22 MED ORDER — CALCIUM CARBONATE ANTACID 500 MG PO CHEW
250.0000 mg | CHEWABLE_TABLET | Freq: Three times a day (TID) | ORAL | Status: DC
  Filled 2020-03-22: qty 2

## 2020-03-22 MED ORDER — HEPARIN (PORCINE) 25000 UT/250ML-% IV SOLN
1450.0000 [IU]/h | INTRAVENOUS | Status: DC
  Administered 2020-03-22 – 2020-03-23 (×3): 1450 [IU]/h via INTRAVENOUS
  Filled 2020-03-22 (×3): qty 250

## 2020-03-22 MED ORDER — ASPIRIN 81 MG PO CHEW: 324.0000 mg | CHEWABLE_TABLET | ORAL | Status: DC

## 2020-03-22 MED ORDER — SODIUM CHLORIDE 0.9% FLUSH
3.0000 mL | Freq: Two times a day (BID) | INTRAVENOUS | Status: DC
  Administered 2020-03-22 – 2020-03-23 (×4): 3 mL via INTRAVENOUS

## 2020-03-22 MED ORDER — AMLODIPINE BESYLATE 2.5 MG PO TABS
2.5000 mg | ORAL_TABLET | Freq: Every day | ORAL | Status: DC
  Administered 2020-03-22: 2.5 mg via ORAL
  Filled 2020-03-22: qty 1

## 2020-03-22 MED ORDER — SODIUM CHLORIDE 0.9% IV SOLUTION: Freq: Once | INTRAVENOUS | Status: AC

## 2020-03-22 MED ORDER — SODIUM CHLORIDE 0.9 % IV SOLN: INTRAVENOUS | Status: DC

## 2020-03-22 MED ORDER — ASPIRIN 81 MG PO CHEW
81.0000 mg | CHEWABLE_TABLET | ORAL | Status: AC
  Administered 2020-03-23: 81 mg via ORAL
  Filled 2020-03-22: qty 1

## 2020-03-22 MED ORDER — ISOSORBIDE MONONITRATE ER 60 MG PO TB24
90.0000 mg | ORAL_TABLET | Freq: Every day | ORAL | Status: DC
  Administered 2020-03-22 – 2020-03-25 (×4): 90 mg via ORAL
  Filled 2020-03-22 (×4): qty 1

## 2020-03-22 MED ORDER — FUROSEMIDE 10 MG/ML IJ SOLN
160.0000 mg | Freq: Once | INTRAVENOUS | Status: AC
  Administered 2020-03-22: 160 mg via INTRAVENOUS
  Filled 2020-03-22: qty 16

## 2020-03-22 MED ORDER — CARVEDILOL 12.5 MG PO TABS
12.5000 mg | ORAL_TABLET | Freq: Every day | ORAL | Status: DC
  Administered 2020-03-22 – 2020-03-25 (×4): 12.5 mg via ORAL
  Filled 2020-03-22 (×4): qty 1

## 2020-03-22 MED ORDER — PREDNISONE 50 MG PO TABS
50.0000 mg | ORAL_TABLET | Freq: Four times a day (QID) | ORAL | Status: AC
  Administered 2020-03-23 (×3): 50 mg via ORAL
  Filled 2020-03-22 (×3): qty 1

## 2020-03-22 MED ORDER — DIPHENHYDRAMINE HCL 25 MG PO CAPS
50.0000 mg | ORAL_CAPSULE | Freq: Once | ORAL | Status: AC
  Administered 2020-03-23: 50 mg via ORAL
  Filled 2020-03-22: qty 2

## 2020-03-22 MED ORDER — SODIUM CHLORIDE 0.9% FLUSH
3.0000 mL | Freq: Two times a day (BID) | INTRAVENOUS | Status: DC
  Administered 2020-03-22 – 2020-03-23 (×2): 3 mL via INTRAVENOUS

## 2020-03-22 MED ORDER — DIPHENHYDRAMINE HCL 25 MG PO CAPS
25.0000 mg | ORAL_CAPSULE | Freq: Once | ORAL | Status: AC
  Administered 2020-03-22: 25 mg via ORAL
  Filled 2020-03-22: qty 1

## 2020-03-22 MED ORDER — ONDANSETRON HCL 4 MG/2ML IJ SOLN
4.0000 mg | Freq: Four times a day (QID) | INTRAMUSCULAR | Status: DC | PRN
  Administered 2020-03-24 – 2020-03-25 (×2): 4 mg via INTRAVENOUS
  Filled 2020-03-22 (×3): qty 2

## 2020-03-22 MED ORDER — BUSPIRONE HCL 10 MG PO TABS
20.0000 mg | ORAL_TABLET | Freq: Two times a day (BID) | ORAL | Status: DC
  Filled 2020-03-22: qty 2

## 2020-03-22 NOTE — Consult Note (Signed)
   [facility] Inpatient Consult   03/22/2020  Shlok Raz [facility] 1956/01/13 [id number]  [place] Organization [ACO] Patient: Medicare NextGen   Patient is currently active with [facility] for chronic disease management services.  Patient has been engaged by a [facility].  Our community based plan of care has focused on disease management and community resource support.     Plan:  Follow patient for progress and disposition needs or changes.  Patient discussed in unit progression with Inpatient Transition Of Care [TOC] team member to make aware that [facility] following.   Of note, [facility] Care Management services does not replace or interfere with any services that are needed or arranged by inpatient [facility] care management team.  For additional questions or referrals please contact:  Natividad Brood, RN BSN [facility] Liaison  [phone] business mobile phone Toll free office [phone]  Fax number: [phone] [email or web address]

## 2020-03-22 NOTE — TOC Initial Note (Signed)
Transition of Care [facility]) - Initial/Assessment Note    Patient Details  Name: [name] [name] MRN: [id number] Date of Birth: 04-May-1956  Transition of [facility]) CM/SW Contact:    Zenon Mayo, RN Phone Number: 03/22/2020, 8:52 AM  Clinical Narrative:                 Patient is from home with his son and daughter n law. NCM asked if he would like a HHRN for CHF , he stated no he does not.  He has walker, cane and w/chair at home.  He has transport at dc. He has no issues with getting medications.  Expected Discharge Plan: Home/Self Care Barriers to Discharge: Continued Medical Work up   Patient Goals and CMS Choice Patient states their goals for this hospitalization and ongoing recovery are:: get better      Expected Discharge Plan and Services Expected Discharge Plan: Home/Self Care   Discharge Planning Services: CM Consult   Living arrangements for the past 2 months: Single Family Home                   DME Agency: NA       HH Arranged: Refused HH          Prior Living Arrangements/Services Living arrangements for the past 2 months: Single Family Home Lives with:: Adult Children Patient language and need for interpreter reviewed:: Yes Do you feel safe going back to the place where you live?: Yes      Need for Family Participation in Patient Care: Yes (Comment) Care giver support system in place?: Yes (comment) Current home services: DME (walker, cane and wheelchair) Criminal Activity/Legal Involvement Pertinent to Current Situation/Hospitalization: No - Comment as needed  Activities of Daily Living Home Assistive Devices/Equipment: Cane (specify quad or straight), Eyeglasses, Dentures (specify type), CPAP, CBG Meter, Walker (specify type), Wheelchair ADL Screening (condition at time of admission) Patient's cognitive ability adequate to safely complete daily activities?: Yes Is the patient deaf or have difficulty hearing?: No Does the patient have  difficulty seeing, even when wearing glasses/contacts?: No Does the patient have difficulty concentrating, remembering, or making decisions?: No Patient able to express need for assistance with ADLs?: Yes Does the patient have difficulty dressing or bathing?: No Independently performs ADLs?: Yes (appropriate for developmental age) Does the patient have difficulty walking or climbing stairs?: Yes Weakness of Legs: Both Weakness of Arms/Hands: Both  Permission Sought/Granted                  Emotional Assessment   Attitude/Demeanor/Rapport: Engaged Affect (typically observed): Appropriate Orientation: : Oriented to Self, Oriented to Place, Oriented to  Time, Oriented to Situation Alcohol / Substance Use: Not Applicable Psych Involvement: No (comment)  Admission diagnosis:  CAD (coronary artery disease) [I25.10] Severe aortic stenosis [I35.0] Patient Active Problem List   Diagnosis Date Noted  . CAD (coronary artery disease) 03/22/2020  . Chronic heart failure with preserved ejection fraction (HFpEF) ([place]) 03/21/2020  . Hx of CABG 03/21/2020  . Anemia 03/21/2020  . Unstable angina ([place]) 03/20/2020  . Chronic kidney disease   . History of non-ST elevation myocardial infarction (NSTEMI) 02/13/2020  . Moderate aortic valve stenosis 02/13/2020  . Acute diastolic CHF (congestive heart failure) ([place]) 02/13/2020  . Hypertensive urgency 02/13/2020  . Fall with injury 11/16/2019  . Leg weakness, bilateral 11/16/2019  . Benign hypertensive kidney disease with chronic kidney disease 09/02/2019  . Proteinuria 09/02/2019  . Secondary hyperparathyroidism of renal origin (  [place]) 09/02/2019  . Left arm pain 02/11/2019  . Gastroesophageal reflux 03/25/2018  . Atherosclerosis of aorta ([place]) 09/04/2017  . Thoracic aortic aneurysm ([place]) 09/04/2017  . Right thyroid nodule 04/12/2017  . Thrombocytopenia ([place]) 03/16/2017  . CKD stage 4 due to type 2 diabetes mellitus ([place]) 03/15/2017  .  Advance directive discussed with patient 03/15/2017  . BMI 40.0-44.9, adult ([place]) 09/12/2016  . Psoriatic arthritis ([place])   . Lung nodule < 6cm on CT 03/29/2016  . COPD with acute bronchitis ([place]) 03/29/2016  . Headache 09/14/2015  . Constipation 10/05/2014  . PVD (peripheral vascular disease) ([place]) 04/23/2014  . Memory loss 04/23/2014  . Preventative health care 11/23/2013  . Benign prostatic hypertrophy without urinary obstruction 03/25/2013  . Enlarged prostate without lower urinary tract symptoms ([place]) 03/25/2013  . Peripheral vascular disease due to secondary diabetes mellitus ([place]) 03/25/2013  . Obesity 02/12/2013  . Atherosclerotic heart disease of native coronary artery with angina pectoris ([place]) 06/06/2012  . DM (diabetes mellitus), type 2 with complications ([place]) 24/58/0998  . Severe aortic stenosis   . Low back pain 12/27/2011  . MDD (major depressive disorder), recurrent episode ([place]) 05/28/2011  . Carotid artery disease ([place]) 04/17/2011  . Occlusion and stenosis of carotid artery 04/17/2011  . Obstructive sleep apnea 03/05/2011  . Psoriasis 03/05/2011  . Type 2 diabetes mellitus with diabetic peripheral angiopathy without gangrene ([place]) 03/05/2011  . Polyneuropathy 03/05/2011  . 3-vessel CAD   . Hyperlipidemia, mixed   . Essential hypertension    PCP:  [name], MD Pharmacy:   [facility], [address] [place] [address] Phone: [phone] Fax: [phone]  [facility] - [place], [place] - [address] [place] [address] Phone: [phone] Fax: [phone]     Social Determinants of Health (SDOH) Interventions    Readmission Risk Interventions Readmission Risk Prevention Plan 03/22/2020 02/18/2020  Transportation Screening Complete -  PCP or Specialist Appt within 3-5 Days - Complete  HRI or [place] - Complete   Social Work Consult for [place] Planning/Counseling - Complete  Palliative Care Screening - Not Applicable  Medication Review Press photographer) Complete Complete  PCP or Specialist appointment within 3-5 days of discharge Complete -  [place] or Home Care Consult Complete -  SW Recovery Care/Counseling Consult Complete -  Palliative Care Screening Not Applicable -  [place] Not Applicable -  Some recent data might be hidden

## 2020-03-22 NOTE — Progress Notes (Signed)
Progress Note  Patient Name: [name] [name] Date of Encounter: 03/22/2020  Primary Cardiologist:  [name], MD  Subjective   No CP or SOB overnight. Wants to make sure that home pain rx is ordered, flexeril and narcotics. Wants to have valve surgery.  Inpatient Medications    Scheduled Meds: . amLODipine  2.5 mg Oral Daily  . aspirin EC  81 mg Oral Daily  . busPIRone  20 mg Oral BID  . [START ON 03/23/2020] calcitRIOL  0.25 mcg Oral Q M,W,F  . calcium carbonate  250 mg Oral TID  . carvedilol  12.5 mg Oral Daily  . clopidogrel  75 mg Oral Daily  . finasteride  5 mg Oral Daily  . insulin aspart  0-5 Units Subcutaneous QHS  . insulin aspart  0-9 Units Subcutaneous TID WC  . isosorbide mononitrate  90 mg Oral Daily  . sertraline  100 mg Oral QHS  . sodium chloride flush  3 mL Intravenous Q12H  . tamsulosin  0.4 mg Oral Daily   Continuous Infusions: . sodium chloride    . heparin 1,450 Units/hr (03/22/20 0513)  . nitroGLYCERIN 30 mcg/min (03/22/20 0513)   PRN Meds: sodium chloride, acetaminophen, albuterol, alum & mag hydroxide-simeth, nitroGLYCERIN, ondansetron (ZOFRAN) IV, sodium chloride flush   Vital Signs    Vitals:   03/22/20 0143 03/22/20 0147 03/22/20 0500 03/22/20 0747  BP: 125/66   (!) 145/84  Pulse:  92  89  Resp:  20    Temp:  (!) 97.5 F (36.4 C)  97.8 F (36.6 C)  TempSrc:  Oral    SpO2:  98%  94%  Weight:   106.1 kg   Height:        Intake/Output Summary (Last 24 hours) at 03/22/2020 0912 Last data filed at 03/22/2020 0513 Gross per 24 hour  Intake 555.48 ml  Output 1200 ml  Net -644.52 ml   Filed Weights   03/21/20 2123 03/22/20 0500  Weight: 106.2 kg 106.1 kg   Last Weight  Most recent update: 03/22/2020  5:56 AM   Weight  106.1 kg (233 lb 14.4 oz)           Weight change:    Telemetry    SR - Personally Reviewed  ECG    08/31 ECG is SR, HR 95, LBBB w/ QRS 124 ms  - Personally Reviewed  Physical Exam   General: Well  developed, well nourished, male appearing in no acute distress. Head: Normocephalic, atraumatic.  Neck: Supple without bruits, JVD not seen elevated, difficult to assess Lungs:  Resp regular and unlabored, scattered rales. Heart: RRR, S1, S2, no S3, S4, 2/6 murmur; no rub. Abdomen: Soft, non-tender, non-distended with normoactive bowel sounds. No hepatomegaly. No rebound/guarding. No obvious abdominal masses. Extremities: No clubbing, cyanosis, no edema. Distal pedal pulses are 2+ bilaterally. R groin and R radial cath sites w/out ecchymosis or hematoma Neuro: Alert and oriented X 3. Moves all extremities spontaneously. Psych: Normal affect.  Labs    Hematology Recent Labs  Lab 03/20/20 [id number] 03/20/20 1358 03/21/20 0341 03/22/20 0654  WBC 6.6  --  5.9 8.7  RBC 2.49* 2.48* 2.42* 2.22*  HGB 8.0*  --  7.8* 7.1*  HCT 24.3*  --  23.9* 22.5*  MCV 97.6  --  98.8 101.4*  MCH 32.1  --  32.2 32.0  MCHC 32.9  --  32.6 31.6  RDW 14.3  --  14.1 14.3  PLT 125*  --  125* 135*  Chemistry Recent Labs  Lab 03/20/20 0620 03/20/20 0919 03/22/20 0654  NA 138  --  134*  K 3.8  --  3.9  CL 105  --  99  CO2 20*  --  23  GLUCOSE 178*  --  210*  BUN 63*  --  82*  CREATININE 4.21* 4.58* 4.47*  CALCIUM 7.8*  --  8.5*  GFRNONAA 14* 12* 13*  GFRAA 16* 14* 15*  ANIONGAP 13  --  12    Lab Results  Component Value Date   ALT 15 02/13/2020   AST 18 02/13/2020   ALKPHOS 95 02/13/2020   BILITOT 0.6 02/13/2020   High Sensitivity Troponin:   Recent Labs  Lab 03/11/20 1134 03/20/20 0620 03/20/20 0827 03/20/20 1255  TROPONINIHS 15 19* 51* 86*      BNP Recent Labs  Lab 03/20/20 0620  BNP 503.2*    Lab Results  Component Value Date   CHOL 122 02/13/2020   HDL 33 (L) 02/13/2020   LDLCALC 56 02/13/2020   LDLDIRECT 61.0 03/15/2017   TRIG 163 (H) 02/13/2020   CHOLHDL 3.7 02/13/2020   Lab Results  Component Value Date   TSH 2.170 04/11/2017   Lab Results  Component Value Date     HGBA1C 5.7 (H) 02/13/2020    Radiology    CARDIAC CATHETERIZATION  Result Date: 03/21/2020  Colon Flattery Cx to Prox Cx lesion is 85% stenosed.  Mid Cx to Dist Cx lesion is 100% stenosed.  1st Mrg lesion is 70% stenosed.  Prox LAD to Mid LAD lesion is 90% stenosed.  Ost RCA to Prox RCA lesion is 99% stenosed.  SVG.  The graft exhibits mild diffuse disease.  Left radial artery graft was visualized by angiography.  Origin lesion is 100% stenosed.  LIMA graft was visualized by non-selective angiography and is normal in caliber.  The graft exhibits no disease.  1.  Significant underlying three-vessel coronary artery disease with patent LIMA to LAD and SVG to OM 3.  Radial graft to RCA is occluded at the ostium with critical stenosis of the ostial native right coronary artery.  This is the likely culprit for recent non-STEMI and current unstable angina. 2.  Severe aortic stenosis with peak gradient of 27.4 mmHg and valve area of 0.87. 3.  Right heart catheterization showed moderately to severely elevated filling pressures with pulmonary wedge pressure of 31 mmHg, moderate pulmonary hypertension at 50/30 mmHg and normal cardiac output at 5.06 with a cardiac index of 2.36.  Prominent V waves on wedge pressure tracing suggestive of significant mitral regurgitation. Recommendations: This is an overall difficult case.  The patient will need to be evaluated for TAVR plus RCA PCI.  Advanced chronic kidney disease and anemia in addition to significant heart failure make timing of this difficult.  The patient will likely need to be initiated on hemodialysis in order to improve his volume status.  He needs blood transfusion to keep his hemoglobin above 9.  Nonetheless, he might not be able to tolerate large volume shifts due to significant coronary artery disease and aortic stenosis. I will discuss with our structural heart team.  It might be best to transfer the patient to [facility]. Vascular access on this  patient is very difficult due to extensive stenting and calcifications of the lower extremities.  The SFA stent on the right side might be extending into the common femoral artery.  Arterial access might be easier via the left femoral artery.   DG  Chest Portable 1 View  Result Date: 03/20/2020 CLINICAL DATA:  Chest pain. EXAM: PORTABLE CHEST 1 VIEW COMPARISON:  February 15, 2020 FINDINGS: No pneumothorax. Stable cardiomegaly. Diffuse interstitial opacities are identified. No nodules or masses. No focal infiltrates. IMPRESSION: Diffuse interstitial opacities suggestive of pulmonary edema given cardiomegaly. Atypical infection could have a similar appearance. Recommend clinical correlation. Electronically Signed   By: Dorise Bullion III M.D   On: 03/20/2020 08:08     Cardiac Studies   ECHO:  01/11/2020 1. Left ventricular ejection fraction, by estimation, is 50 to 55%. The  left ventricle has low normal function. The left ventricle has no regional  wall motion abnormalities. There is mild left ventricular hypertrophy.  Left ventricular diastolic  parameters are consistent with Grade II diastolic dysfunction  (pseudonormalization).  2. Right ventricular systolic function is normal. The right ventricular  size is normal. There is moderately elevated pulmonary artery systolic  pressure.  3. Left atrial size was mildly dilated.  4. The mitral valve is abnormal. Mild to moderate mitral valve  regurgitation. No evidence of mitral stenosis.  5. The aortic valve is abnormal. Aortic valve regurgitation is not  visualized. Moderate aortic valve stenosis. Aortic valve area, by VTI  measures 1.06 cm. Aortic valve mean gradient measures 26.0 mmHg.  6. Aortic dilatation noted. There is mild dilatation of the ascending  aorta measuring 41 mm.  7. The inferior vena cava is normal in size with <50% respiratory  variability, suggesting right atrial pressure of 8 mmHg.   Comparison(s): Previous Echo  showed normal LV systolic function with  moderate aortic stenosis and mildly dilated aortic root at 4 cm. Aortic  valve area was 1.28 cm.   CATH: 03/21/2020  Ost Cx to Prox Cx lesion is 85% stenosed.  Mid Cx to Dist Cx lesion is 100% stenosed.  1st Mrg lesion is 70% stenosed.  Prox LAD to Mid LAD lesion is 90% stenosed.  Ost RCA to Prox RCA lesion is 99% stenosed.  SVG.  The graft exhibits mild diffuse disease.  Left radial artery graft was visualized by angiography.  Origin lesion is 100% stenosed.  LIMA graft was visualized by non-selective angiography and is normal in caliber.  The graft exhibits no disease.   1.  Significant underlying three-vessel coronary artery disease with patent LIMA to LAD and SVG to OM 3.  Radial graft to RCA is occluded at the ostium with critical stenosis of the ostial native right coronary artery.  This is the likely culprit for recent non-STEMI and current unstable angina. 2.  Severe aortic stenosis with peak gradient of 27.4 mmHg and valve area of 0.87. 3.  Right heart catheterization showed moderately to severely elevated filling pressures with pulmonary wedge pressure of 31 mmHg, moderate pulmonary hypertension at 50/30 mmHg and normal cardiac output at 5.06 with a cardiac index of 2.36.  Prominent V waves on wedge pressure tracing suggestive of significant mitral regurgitation.  Recommendations: This is an overall difficult case.  The patient will need to be evaluated for TAVR plus RCA PCI.  Advanced chronic kidney disease and anemia in addition to significant heart failure make timing of this difficult.  The patient will likely need to be initiated on hemodialysis in order to improve his volume status.  He needs blood transfusion to keep his hemoglobin above 9.  Nonetheless, he might not be able to tolerate large volume shifts due to significant coronary artery disease and aortic stenosis. I will discuss with our structural heart  team.  It  might be best to transfer the patient to [facility]. Vascular access on this patient is very difficult due to extensive stenting and calcifications of the lower extremities.  The SFA stent on the right side might be extending into the common femoral artery.  Arterial access might be easier via the left femoral artery. Diagnostic Dominance: Right  Dr Fletcher Anon: I also discussed the case with Dr. [name] Knack and we think the best option is to proceed with RCA PCI once he is tuned up. Recommend blood transfusion to achieve a hemoglobin above 9.  Given significant volume overload, the patient requires diuresis which can likely be started tomorrow with furosemide intravenously 80 mg twice daily and if he does not respond to that he might require Lasix drip or starting hemodialysis.  Once he is transfused, he should be loaded with clopidogrel 600 mg once followed by 75 mg once daily.  He should remain on heparin drip until his RCA PCI. Ultimately he will require evaluation for TAVR although that is going to be difficult due to lack of good vascular access.  He might require an apical approach.  The patient will be seen by the structural heart team once already arrives at [facility].  Patient Profile     64 y.o. male w/ hx CAD s/p CABG in 2012, severe aortic stenosis by RHC 03/21/2020, carotid disease s/p left-sided CEA with recent 12/2019 carotid studies as below under CV studies, DM2, CKD stage IV, anemia of chronic disease, mildly dilated aortic root, hypertension, hyperlipidemia, PAD s/p extensive lower extremity revascularization by vascular surgery 05/2019, tobacco use though reportedly recently quit, obesity, and psoriasis.  Assessment & Plan    1. CAD - See cath note above. - needs PCI RCA - pain-free on ASA, BB, heparin and nitrates - pta on Zocor 20 mg, LDL 61 at last check - timing of PCI per MD  2. Severe AS - see gradients above, peak gradient of 27.4 mmHg and valve area of 0.87. - Dr Fletcher Anon  spoke w/ Dr [name] Knack - timing of TAVR eval per MD  3. Volume overload -  pulmonary wedge pressure of 31 mmHg, moderate pulmonary hypertension at 50/30 mmHg and normal cardiac output at 5.06 with a cardiac index of 2.36 at cath - has not gotten Lasix overnight, Dr Fletcher Anon recommended 80 mg IV BID - discuss Nephrology consult w/ MD  4. Anemia: - likely of chronic dz - baseline Hgb approx 10, was 8.8 on 08/24, 8.0>>7.8 pre-cath, now 7.1 - will type and screen, transfuse 1 U PRBCs, further transfusions per MD - Dr Fletcher Anon recommended to keep Hgb > 9  5. Chronic pain issues - pt mentioned meds that are not on his home med list - will look further into this  6. PAD - no ongoing sx - see Dr Tyrell Antonio note, rec procedures via L femoral artery  Otherwise, continue home rx Principal Problem:   Severe aortic stenosis Active Problems:   3-vessel CAD   Hyperlipidemia, mixed   Essential hypertension   Obstructive sleep apnea   Carotid artery disease (HCC)   Obesity   PVD (peripheral vascular disease) ([place])   DM (diabetes mellitus), type 2 with complications ([place])   CKD stage 4 due to type 2 diabetes mellitus ([place])   Acute diastolic CHF (congestive heart failure) (HCC)   Chronic heart failure with preserved ejection fraction (HFpEF) (HCC)   Hx of CABG   Anemia   CAD (coronary artery disease)  Jonetta Speak , PA-C 9:12 AM 03/22/2020 Pager: [phone]

## 2020-03-22 NOTE — Progress Notes (Signed)
PROGRESS NOTE  MINOR IDEN GLO:[id number] DOB: 1955/08/29 DOA: 03/21/2020 PCP: [name], MD   LOS: 1 day   Brief narrative: As per HPI,  [name] [name] is a 64 y.o. male with history of CAD s/p CABG in 2012, severe aortic stenosis by RHC 03/21/2020, carotid disease s/p left-sided CEA with recent 12/2019 , DM2, CKD stage IV, anemia of chronic disease, mildly dilated aortic root, hypertension, hyperlipidemia, PAD s/p extensive lower extremity revascularization by vascular surgery 05/2019, tobacco use though reportedly recently quit, obesity, and psoriasis presented to the hospital on 03/19/2020 with chest pain and was brought into the hospital.  Patient was started on heparin and nitro infusion.  On 03/21/2020 patient underwent cardiac catheterization with three-vessel coronary artery disease with severe aortic stenosis and moderate pulmonary hypertension/significant mitral regurgitation.  Patient was then transferred from [facility] to [facility] for possible TAVR plus RCA PCI.  Patient will likely need hemodialysis prior to surgical intervention.  Medical team was consulted for management of diabetes.  Assessment/Plan:  Principal Problem:   Severe aortic stenosis Active Problems:   3-vessel CAD   Hyperlipidemia, mixed   Essential hypertension   Obstructive sleep apnea   Carotid artery disease (HCC)   Obesity   PVD (peripheral vascular disease) (HCC)   DM (diabetes mellitus), type 2 with complications ([place])   CKD stage 4 due to type 2 diabetes mellitus (HCC)   Acute diastolic CHF (congestive heart failure) (HCC)   Chronic heart failure with preserved ejection fraction (HFpEF) (HCC)   Hx of CABG   Anemia   CAD (coronary artery disease)   Diabetes mellitus type 2 with CKD stage IV.  Continue sliding scale insulin, Accu-Cheks, diabetic diet.  Closely monitor.  Latest hemoglobin A1c 1 month ago was 5.7.  A1c 1 year ago at 6.1.  Unstable angina with severe aortic  stenosis/mitral regurgitation status post cardiac cath with significant multivessel three-vessel coronary artery disease..  Patient will likely need TAVR and PCI to RCA.  On heparin drip.  Further management as per cardiology.  Currently on aspirin beta blockers heparin drip and nitrates.  Volume overload, pulmonary hypertension.  Lasix as per cardiology.  Might need hemodialysis.  Chronic kidney disease stage IV.  Will need to closely monitor.  Decision for hemodialysis as per primary team.  Anemia of chronic kidney disease.  Monitor CBC.  Baseline hemoglobin of around 10.  Transfuse to keep more than 9.  Essential hypertension.  On amlodipine, Coreg, Lasix  Sleep apnea.  Continue nocturnal CPAP  DVT prophylaxis: Heparin drip  Code Status: Full code  Procedures: CATH: 03/21/2020  Ost Cx to Prox Cx lesion is 85% stenosed.  Mid Cx to Dist Cx lesion is 100% stenosed.  1st Mrg lesion is 70% stenosed.  Prox LAD to Mid LAD lesion is 90% stenosed.  Ost RCA to Prox RCA lesion is 99% stenosed.  SVG.  The graft exhibits mild diffuse disease.  Left radial artery graft was visualized by angiography.  Origin lesion is 100% stenosed.  LIMA graft was visualized by non-selective angiography and is normal in caliber.  The graft exhibits no disease.  Antibiotics:  . None  Anti-infectives (From admission, onward)   None      Subjective: Today, patient was seen and examined at bedside.  Denies overt chest pain, shortness of breath.  On nasal CPAP.  Denies fever or chills.  Objective: Vitals:   03/22/20 0147 03/22/20 0747  BP:  (!) 145/84  Pulse: 92 89  Resp: 20   Temp: (!) 97.5 F (36.4 C) 97.8 F (36.6 C)  SpO2: 98% 94%    Intake/Output Summary (Last 24 hours) at 03/22/2020 1012 Last data filed at 03/22/2020 0513 Gross per 24 hour  Intake 555.48 ml  Output 1200 ml  Net -644.52 ml   Filed Weights   03/21/20 2123 03/22/20 0500  Weight: 106.2 kg 106.1 kg   Body  mass index is 37.75 kg/m.   Physical Exam: GENERAL: Patient is alert awake and oriented. Not in obvious distress.  On nasal CPAP.  Obese HENT: No scleral pallor or icterus. Pupils equally reactive to light. Oral mucosa is moist NECK: is supple, no gross swelling noted. CHEST: Diminished breath sounds bilaterally. CVS: S1 and S2 heard, no murmur. Regular rate and rhythm.  ABDOMEN: Soft, non-tender, bowel sounds are present. EXTREMITIES: No edema.  Right radial cath site with dressing. CNS: Cranial nerves are intact. No focal motor deficits. SKIN: warm and dry without rashes.  Data Review: I have personally reviewed the following laboratory data and studies,  CBC: Recent Labs  Lab 03/20/20 0620 03/21/20 0341 03/22/20 0654  WBC 6.6 5.9 8.7  NEUTROABS 4.3  --  6.2  HGB 8.0* 7.8* 7.1*  HCT 24.3* 23.9* 22.5*  MCV 97.6 98.8 101.4*  PLT 125* 125* 433*   Basic Metabolic Panel: Recent Labs  Lab 03/20/20 0620 03/20/20 0919 03/22/20 0654  NA 138  --  134*  K 3.8  --  3.9  CL 105  --  99  CO2 20*  --  23  GLUCOSE 178*  --  210*  BUN 63*  --  82*  CREATININE 4.21* 4.58* 4.47*  CALCIUM 7.8*  --  8.5*  MG 2.2  --   --    Liver Function Tests: No results for input(s): AST, ALT, ALKPHOS, BILITOT, PROT, ALBUMIN in the last 168 hours. No results for input(s): LIPASE, AMYLASE in the last 168 hours. No results for input(s): AMMONIA in the last 168 hours. Cardiac Enzymes: No results for input(s): CKTOTAL, CKMB, CKMBINDEX, TROPONINI in the last 168 hours. BNP (last 3 results) Recent Labs    02/13/20 0033 03/20/20 0620  BNP 713.3* 503.2*    ProBNP (last 3 results) No results for input(s): PROBNP in the last 8760 hours.  CBG: Recent Labs  Lab 03/21/20 1227 03/21/20 1729 03/21/20 2147 03/22/20 0156 03/22/20 0655  GLUCAP 200* 274* 247* 186* 217*   Recent Results (from the past 240 hour(s))  SARS CORONAVIRUS 2 (TAT 6-24 HRS) Nasopharyngeal Nasopharyngeal Swab     Status:  None   Collection Time: 03/14/20  3:40 PM   Specimen: Nasopharyngeal Swab  Result Value Ref Range Status   SARS Coronavirus 2 NEGATIVE NEGATIVE Final    Comment: (NOTE) SARS-CoV-2 target nucleic acids are NOT DETECTED.  The SARS-CoV-2 RNA is generally detectable in upper and lower respiratory specimens during the acute phase of infection. Negative results do not preclude SARS-CoV-2 infection, do not rule out co-infections with other pathogens, and should not be used as the sole basis for treatment or other patient management decisions. Negative results must be combined with clinical observations, patient history, and epidemiological information. The expected result is Negative.  Fact Sheet for Patients: SugarRoll.be  Fact Sheet for Healthcare Providers: https://www.woods-mathews.com/  This test is not yet approved or cleared by the [place] FDA and  has been authorized for detection and/or diagnosis of SARS-CoV-2 by FDA under an Emergency Use Authorization (EUA). This EUA will remain  in effect (meaning this test can be used) for the duration of the COVID-19 declaration under Se ction 564(b)(1) of the Act, 21 U.S.C. section 360bbb-3(b)(1), unless the authorization is terminated or revoked sooner.  Performed at [facility], [place] [address] [place], [place] [address]   SARS Coronavirus 2 by RT PCR (hospital order, performed in [facility] hospital lab) Nasopharyngeal Nasopharyngeal Swab     Status: None   Collection Time: 03/20/20  7:28 AM   Specimen: Nasopharyngeal Swab  Result Value Ref Range Status   SARS Coronavirus 2 NEGATIVE NEGATIVE Final    Comment: (NOTE) SARS-CoV-2 target nucleic acids are NOT DETECTED.  The SARS-CoV-2 RNA is generally detectable in upper and lower respiratory specimens during the acute phase of infection. The lowest concentration of SARS-CoV-2 viral copies this assay can detect is 250 copies /  mL. A negative result does not preclude SARS-CoV-2 infection and should not be used as the sole basis for treatment or other patient management decisions.  A negative result may occur with improper specimen collection / handling, submission of specimen other than nasopharyngeal swab, presence of viral mutation(s) within the areas targeted by this assay, and inadequate number of viral copies (<250 copies / mL). A negative result must be combined with clinical observations, patient history, and epidemiological information.  Fact Sheet for Patients:   StrictlyIdeas.no  Fact Sheet for Healthcare Providers: BankingDealers.co.za  This test is not yet approved or  cleared by the [place] FDA and has been authorized for detection and/or diagnosis of SARS-CoV-2 by FDA under an Emergency Use Authorization (EUA).  This EUA will remain in effect (meaning this test can be used) for the duration of the COVID-19 declaration under Section 564(b)(1) of the Act, 21 U.S.C. section 360bbb-3(b)(1), unless the authorization is terminated or revoked sooner.  Performed at [facility], [address], [place],  [address]      Studies: CARDIAC CATHETERIZATION  Result Date: 03/21/2020  Colon Flattery Cx to Prox Cx lesion is 85% stenosed.  Mid Cx to Dist Cx lesion is 100% stenosed.  1st Mrg lesion is 70% stenosed.  Prox LAD to Mid LAD lesion is 90% stenosed.  Ost RCA to Prox RCA lesion is 99% stenosed.  SVG.  The graft exhibits mild diffuse disease.  Left radial artery graft was visualized by angiography.  Origin lesion is 100% stenosed.  LIMA graft was visualized by non-selective angiography and is normal in caliber.  The graft exhibits no disease.  1.  Significant underlying three-vessel coronary artery disease with patent LIMA to LAD and SVG to OM 3.  Radial graft to RCA is occluded at the ostium with critical stenosis of the ostial native  right coronary artery.  This is the likely culprit for recent non-STEMI and current unstable angina. 2.  Severe aortic stenosis with peak gradient of 27.4 mmHg and valve area of 0.87. 3.  Right heart catheterization showed moderately to severely elevated filling pressures with pulmonary wedge pressure of 31 mmHg, moderate pulmonary hypertension at 50/30 mmHg and normal cardiac output at 5.06 with a cardiac index of 2.36.  Prominent V waves on wedge pressure tracing suggestive of significant mitral regurgitation. Recommendations: This is an overall difficult case.  The patient will need to be evaluated for TAVR plus RCA PCI.  Advanced chronic kidney disease and anemia in addition to significant heart failure make timing of this difficult.  The patient will likely need to be initiated on hemodialysis in order to improve his  volume status.  He needs blood transfusion to keep his hemoglobin above 9.  Nonetheless, he might not be able to tolerate large volume shifts due to significant coronary artery disease and aortic stenosis. I will discuss with our structural heart team.  It might be best to transfer the patient to [facility]. Vascular access on this patient is very difficult due to extensive stenting and calcifications of the lower extremities.  The SFA stent on the right side might be extending into the common femoral artery.  Arterial access might be easier via the left femoral artery.      Flora Lipps, MD  [facility] 03/22/2020

## 2020-03-22 NOTE — Consult Note (Signed)
[place] for heparin  Indication: chest pain/ACS  Allergies  Allergen Reactions  . Contrast Media [Iodinated Diagnostic Agents] Rash and Other (See Comments)    Got very hot and red   . Glipizide Other (See Comments)    ANTIDIABETICS. Burning  . Hydroxychloroquine Other (See Comments)    Stomach upset  . Metrizamide Other (See Comments)    Got very hot and red  . Penicillins Hives and Swelling    Has patient had a PCN reaction causing immediate rash, facial/tongue/throat swelling, SOB or lightheadedness with hypotension: yes Has patient had a PCN reaction causing severe rash involving mucus membranes or skin necrosis: no  Has patient had a PCN reaction that required hospitalization: yes Has patient had a PCN reaction occurring within the last 10 years: no If all of the above answers are "NO", then may proceed with Cephalosporin use.     Patient Measurements: Weight: 106.2 kg (234 lb 3.2 oz) Heparin Dosing Weight: 86.4  Vital Signs: Temp: 98.5 F (36.9 C) (08/30 2122) Temp Source: Oral (08/30 2122) BP: 151/85 (08/30 2122) Pulse Rate: 106 (08/30 2122)  Labs: Recent Labs    03/20/20 0620 03/20/20 0827 03/20/20 0919 03/20/20 1255 03/20/20 1752 03/21/20 0341  HGB 8.0*  --   --   --   --  7.8*  HCT 24.3*  --   --   --   --  23.9*  PLT 125*  --   --   --   --  125*  APTT 34  --   --   --   --   --   LABPROT 14.0  --   --   --   --   --   INR 1.1  --   --   --   --   --   HEPARINUNFRC  --   --   --   --  0.17* 0.55  CREATININE 4.21*  --  4.58*  --   --   --   TROPONINIHS 19* 51*  --  86*  --   --     Estimated Creatinine Clearance: 18.1 mL/min (A) (by C-G formula based on SCr of 4.58 mg/dL (H)).   Assessment: 64 year old male with CAD s/p cath, awaiting PCI 8/31, for heparin.  Heparin restarted after cath at ~2030 at [facility]  Goal of Therapy:  Heparin level 0.3-0.7 units/ml Monitor platelets by anticoagulation protocol: Yes    Plan:  Continue Heparin 1450 units/hr Follow-up am labs.  Phillis Knack, PharmD, BCPS  1:04 AM 03/22/2020

## 2020-03-22 NOTE — Progress Notes (Signed)
Patient has home cpap on.

## 2020-03-22 NOTE — Progress Notes (Signed)
  Echocardiogram 2D Echocardiogram has been performed.  Fidel Levy 03/22/2020, 12:34 PM

## 2020-03-22 NOTE — Consult Note (Signed)
[facility] Medical Consultation  [name] [name] VCB:[id number] DOB: 1955/12/31 DOA: 03/21/2020 PCP: [name], MD   Requesting physician: Dr. Harrington Challenger.  Cardiologist. Date of consultation: March 22, 2020. Reason for consultation: Diabetes management.  Impression/Recommendations Principal Problem:   Severe aortic stenosis Active Problems:   3-vessel CAD   Hyperlipidemia, mixed   Essential hypertension   Obstructive sleep apnea   Carotid artery disease (HCC)   Obesity   PVD (peripheral vascular disease) (HCC)   DM (diabetes mellitus), type 2 with complications ([place])   CKD stage 4 due to type 2 diabetes mellitus (HCC)   Acute diastolic CHF (congestive heart failure) (HCC)   Chronic heart failure with preserved ejection fraction (HFpEF) (HCC)   Hx of CABG   Anemia   CAD (coronary artery disease)    1. Diabetes mellitus type 2 with renal complications for which patient is presently on sliding scale coverage closely monitor CBGs. 2. Chronic and disease stage pain likely progressing to end-stage.  Follow metabolic panel likely will need nephrology input. 3. Anemia likely from renal disease follow CBC.  Hemoglobin is further worsened from previous.  Type and screen transfuse if hemoglobin less than 7. 4. Unstable angina with severe aortic stenosis status post cardiac cath yesterday planning to have further interventions per cardiology once optimized.  Presently on heparin. 5. Hypertension presently on nitroglycerin infusion and continue home medications. 6. Sleep apnea continue CPAP at bedtime.  I will followup again tomorrow. Please contact me if I can be of assistance in the meanwhile. Thank you for this consultation.  Chief Complaint: Chest pain.  HPI:  64 year old male with history of CAD status post CABG moderate aortic stenosis chronic kidney disease stage V anemia diabetes mellitus hypertension was experiencing exertional chest pain for the last 1 month which is  gradually worsened and started having been addressed and was brought to the ER.  Patient was admitted and cardiology did cardiac cath which showed severe three-vessel disease and severe aortic stenosis and since patient will need more optimization patient was transferred to [facility] for further management.  On my exam patient at this time is chest pain-free and not in distress.  Review of Systems:  As presented in history of present illness nothing else significant.  Past Medical History:  Diagnosis Date  . 3-vessel CAD    8/30 LHC with radial graft to RCA occluded at the ostium with critical stenosis of the ostial native RCA.  . Adenomatous colon polyp   . Allergy   . Anxiety   . Arthritis    RA  . Bell's palsy 2007  . CAD (coronary artery disease)    03/21/2020 LHC with patent LIMA to LAD and SVG to OM 3.  Radial graft to RCA occluded at the ostium with critical stenosis of the ostial and native RCA.  . Carotid artery occlusion    Bilateral carotid stenosis s/p left-sided CEA.  . Cataract    Dr. Dawna Part  . CKD (chronic kidney disease), stage IV ([place])    03/2020 vascular surgery access planned  . Depression   . Diabetes mellitus   . Diverticulosis   . Duodenitis   . DVT (deep venous thrombosis) (HCC)    in leg  . Gastropathy   . GERD (gastroesophageal reflux disease)   . Heart failure with preserved ejection fraction ([place])    8/30 RHC with moderately to severely elevated filling pressures and pulmonary wedge pressure 31 mmHg, moderate pulmonary hypertension  . Helicobacter pylori  gastritis   . Hx of CABG    2012  . Hyperlipidemia   . Hypertension   . Mitral regurgitation and aortic stenosis    RHC 8/30 and echo 12/2019.  [name] Neuropathy   . Obesity   . Peripheral vascular disease ([place])    S/p extensive revascularization by vascular surgery 05/2019  . Post splenectomy syndrome   . Psoriasis   . Psoriatic arthritis ([place])   . Renal disorder    stage 4 kidney failure   . Severe aortic stenosis    03/21/20 RHC with peak gradient 27.4 mmHg and valve area 0.87.  [name] Sleep apnea    uses cpap  . Stroke ([place])   . Tobacco use disorder    recently quit  . Tubular adenoma 08/2013   Dr. Hilarie Fredrickson  . Weak urinary stream    Past Surgical History:  Procedure Laterality Date  . CARDIAC CATHETERIZATION  08/2010   LAD: 80% ISR, RCA: 80% ostial, OM 80-90%  . CAROTID ENDARTERECTOMY  08/2010   left/ Dr. Kellie Simmering  . CATARACT EXTRACTION W/PHACO Left 07/09/2017   Procedure: CATARACT EXTRACTION PHACO AND INTRAOCULAR LENS PLACEMENT (IOC);  Surgeon: Birder Robson, MD;  Location: [facility] ORS;  Service: Ophthalmology;  Laterality: Left;  [place] 00:23 AP% 14.2 CDE 3.26 Fluid pack lot # [id number]  . CATARACT EXTRACTION W/PHACO Right 09/10/2017   Procedure: CATARACT EXTRACTION PHACO AND INTRAOCULAR LENS PLACEMENT (IOC);  Surgeon: Birder Robson, MD;  Location: [facility] ORS;  Service: Ophthalmology;  Laterality: Right;  [place] 00:26.4 AP% 17.3 CDE 4.59 Fluid Pack Lot # [id number]  . COLONOSCOPY    . CORONARY ANGIOPLASTY     LAD: before CABG  . CORONARY ARTERY BYPASS GRAFT  09/06/2010   At [facility]: LIMA to LAD, left radial to RCA, sequential SVG to OM3 and 4  . heart stents  Jan 2011   leg stents 06/2009 and 03/2010  . LOWER EXTREMITY ANGIOGRAPHY Left 06/09/2019   Procedure: LOWER EXTREMITY ANGIOGRAPHY;  Surgeon: Katha Cabal, MD;  Location: [place] [facility];  Service: Cardiovascular;  Laterality: Left;  . PTA of illiac and SFA  multiple   Dr. Ronalee Belts, s/p revision 11/2012  . RIGHT AND LEFT HEART CATH N/A 03/21/2020   Procedure: RIGHT AND LEFT HEART CATH possible percutaneous intervention;  Surgeon: Wellington Hampshire, MD;  Location: [place] [facility];  Service: Cardiovascular;  Laterality: N/A;  . RIGHT/LEFT HEART CATH AND CORONARY/GRAFT ANGIOGRAPHY N/A 02/15/2020   Procedure: RIGHT/LEFT HEART CATH AND CORONARY/GRAFT ANGIOGRAPHY;  Surgeon: Wellington Hampshire, MD;  Location: [place] [facility];  Service: Cardiovascular;  Laterality: N/A;  . SPLENECTOMY    . VASCULAR SURGERY     LEG STENTS   Social History:  reports that he quit smoking about 2 years ago. His smoking use included cigarettes. He has a 30.00 pack-year smoking history. He has never used smokeless tobacco. He reports that he does not drink alcohol and does not use drugs.  Allergies  Allergen Reactions  . Contrast Media [Iodinated Diagnostic Agents] Rash and Other (See Comments)    Got very hot and red   . Glipizide Other (See Comments)    ANTIDIABETICS. Burning  . Hydroxychloroquine Other (See Comments)    Stomach upset  . Metrizamide Other (See Comments)    Got very hot and red  . Penicillins Hives and Swelling    Has patient had a PCN reaction causing immediate rash, facial/tongue/throat swelling, SOB or lightheadedness with hypotension: yes Has patient had a  PCN reaction causing severe rash involving mucus membranes or skin necrosis: no  Has patient had a PCN reaction that required hospitalization: yes Has patient had a PCN reaction occurring within the last 10 years: no If all of the above answers are "NO", then may proceed with Cephalosporin use.    Family History  Problem Relation Age of Onset  . Lung cancer Father 9  . Arthritis Father   . Breast cancer Sister 76  . Alcoholism Sister   . Dementia Mother   . Alzheimer's disease Mother   . Arthritis Mother   . Alcoholism Brother   . Epilepsy Sister   . Diabetes Paternal Grandfather   . Colon polyps Paternal Grandfather 68  . Alcoholism Sister   . Alcoholism Sister   . Alcoholism Brother     Prior to Admission medications   Medication Sig Start Date End Date Taking? Authorizing Provider  albuterol (PROAIR HFA) 108 (90 Base) MCG/ACT inhaler Inhale 2 puffs into the lungs every 6 (six) hours as needed for shortness of breath. 09/14/19   [name], MD  amLODipine (NORVASC) 2.5 MG tablet Take 1 tablet (2.5 mg total) by mouth  daily. 02/19/20   Sharen Hones, MD  aspirin 81 MG tablet Take 81 mg by mouth daily.    [provider]  buPROPion (WELLBUTRIN SR) 150 MG 12 hr tablet Take 1 tablet (150 mg total) by mouth daily. Patient not taking: Reported on 03/20/2020 02/23/20   [name], MD  busPIRone (BUSPAR) 10 MG tablet TAKE 2 TABLETS(20 MG) BY MOUTH TWICE DAILY Patient taking differently: Take 20 mg by mouth 2 (two) times daily.  02/19/20   [name], MD  calcitRIOL (ROCALTROL) 0.25 MCG capsule Take 0.25 mcg by mouth every Monday, Wednesday, and Friday. Monday then Wednesday then Friday    [provider]  carvedilol (COREG) 12.5 MG tablet Take 1 tablet (12.5 mg total) by mouth 2 (two) times daily. Patient taking differently: Take 12.5 mg by mouth daily.  12/10/19   Wellington Hampshire, MD  Dextran 70-Hypromellose (ARTIFICIAL TEARS) 0.1-0.3 % SOLN Place 1 drop into both eyes 4 (four) times daily as needed (dry eyes).    [provider]  finasteride (PROSCAR) 5 MG tablet Take 5 mg by mouth daily.    [provider]  heparin 25000-0.45 UT/250ML-% infusion Inject 1,450 Units/hr into the vein continuous. 03/21/20   Nicole Kindred A, DO  insulin aspart (NOVOLOG) 100 UNIT/ML injection Inject 0-5 Units into the skin at bedtime. 03/21/20   Nicole Kindred A, DO  insulin aspart (NOVOLOG) 100 UNIT/ML injection Inject 0-9 Units into the skin 3 (three) times daily with meals. 03/21/20   Ezekiel Slocumb, DO  isosorbide mononitrate (IMDUR) 30 MG 24 hr tablet Take 3 tablets (90 mg total) by mouth daily. 02/23/20   Dunn, Areta Haber, PA-C  nitroGLYCERIN (NITROSTAT) 0.4 MG SL tablet Place 1 tablet (0.4 mg total) under the tongue every 5 (five) minutes as needed. Maximum of 3 doses. Patient taking differently: Place 0.4 mg under the tongue every 5 (five) minutes as needed for chest pain. Maximum of 3 doses. 02/23/20   [name], PA-C  nitroGLYCERIN 0.2 mg/mL infusion Inject 0-200 mcg/min into the vein  continuous. 03/21/20   Ezekiel Slocumb, DO  predniSONE (DELTASONE) 50 MG tablet Take 1 tablet at 11 pm on night before procedure, take 1 tablet at 5 am the morning of procedure and take 1 tablet at 9:30 AM the  morning of procedure 03/11/20   [name], PA-C  sertraline (ZOLOFT) 100 MG tablet Take 1 tablet (100 mg total) by mouth daily. 02/23/20   [name], MD  simvastatin (ZOCOR) 20 MG tablet TAKE 1 TABLET(20 MG) BY MOUTH DAILY AT 6 PM Patient taking differently: Take 20 mg by mouth every evening.  11/20/19   Wellington Hampshire, MD  tamsulosin (FLOMAX) 0.4 MG CAPS capsule Take 1 capsule by mouth daily Patient taking differently: Take 0.4 mg by mouth daily.  01/20/20   [name], MD  torsemide (DEMADEX) 20 MG tablet Take 2 tablets (40 mg total) by mouth daily. 02/24/20   [name], MD   Physical Exam: Blood pressure 125/66, pulse 92, temperature (!) 97.5 F (36.4 C), temperature source Oral, resp. rate 20, height 5\' 6"  (1.676 m), weight 106.1 kg, SpO2 98 %. Vitals:   03/22/20 0143 03/22/20 0147  BP: 125/66   Pulse:  92  Resp:  20  Temp:  (!) 97.5 F (36.4 C)  SpO2:  98%     General: Moderately built and nourished.  Eyes: Anicteric no pallor.  ENT: No discharge from the ears eyes nose or mouth.  Neck: No mass felt.  No neck rigidity.  Cardiovascular: S1-S2 heard.  Respiratory: No rhonchi or crepitations.  Abdomen: Soft nontender bowel sounds present.  Skin: No rash.  Musculoskeletal: No edema.  Psychiatric: Appears normal.  Neurologic: Alert awake oriented to time place and person.  Moves all extremities.  Labs on Admission:  Basic Metabolic Panel: Recent Labs  Lab 03/15/20 0834 03/20/20 0620 03/20/20 0919  NA 138 138  --   K 4.6 3.8  --   CL 100 105  --   CO2 26 20*  --   GLUCOSE 165* 178*  --   BUN 52* 63*  --   CREATININE 4.32* 4.21* 4.58*  CALCIUM 8.7* 7.8*  --   MG  --  2.2  --    Liver Function Tests: No results for input(s):  AST, ALT, ALKPHOS, BILITOT, PROT, ALBUMIN in the last 168 hours. No results for input(s): LIPASE, AMYLASE in the last 168 hours. No results for input(s): AMMONIA in the last 168 hours. CBC: Recent Labs  Lab 03/15/20 0834 03/20/20 0620 03/21/20 0341  WBC 6.2 6.6 5.9  NEUTROABS  --  4.3  --   HGB 8.8* 8.0* 7.8*  HCT 28.0* 24.3* 23.9*  MCV 101.1* 97.6 98.8  PLT 131* 125* 125*   Cardiac Enzymes: No results for input(s): CKTOTAL, CKMB, CKMBINDEX, TROPONINI in the last 168 hours. BNP: Invalid input(s): POCBNP CBG: Recent Labs  Lab 03/21/20 1011 03/21/20 1227 03/21/20 1729 03/21/20 2147 03/22/20 0156  GLUCAP 194* 200* 274* 247* 186*    Radiological Exams on Admission: CARDIAC CATHETERIZATION  Result Date: 03/21/2020  Ost Cx to Prox Cx lesion is 85% stenosed.  Mid Cx to Dist Cx lesion is 100% stenosed.  1st Mrg lesion is 70% stenosed.  Prox LAD to Mid LAD lesion is 90% stenosed.  Ost RCA to Prox RCA lesion is 99% stenosed.  SVG.  The graft exhibits mild diffuse disease.  Left radial artery graft was visualized by angiography.  Origin lesion is 100% stenosed.  LIMA graft was visualized by non-selective angiography and is normal in caliber.  The graft exhibits no disease.  1.  Significant underlying three-vessel coronary artery disease with patent LIMA to LAD and SVG to OM 3.  Radial graft to RCA is occluded at the  ostium with critical stenosis of the ostial native right coronary artery.  This is the likely culprit for recent non-STEMI and current unstable angina. 2.  Severe aortic stenosis with peak gradient of 27.4 mmHg and valve area of 0.87. 3.  Right heart catheterization showed moderately to severely elevated filling pressures with pulmonary wedge pressure of 31 mmHg, moderate pulmonary hypertension at 50/30 mmHg and normal cardiac output at 5.06 with a cardiac index of 2.36.  Prominent V waves on wedge pressure tracing suggestive of significant mitral regurgitation.  Recommendations: This is an overall difficult case.  The patient will need to be evaluated for TAVR plus RCA PCI.  Advanced chronic kidney disease and anemia in addition to significant heart failure make timing of this difficult.  The patient will likely need to be initiated on hemodialysis in order to improve his volume status.  He needs blood transfusion to keep his hemoglobin above 9.  Nonetheless, he might not be able to tolerate large volume shifts due to significant coronary artery disease and aortic stenosis. I will discuss with our structural heart team.  It might be best to transfer the patient to [facility]. Vascular access on this patient is very difficult due to extensive stenting and calcifications of the lower extremities.  The SFA stent on the right side might be extending into the common femoral artery.  Arterial access might be easier via the left femoral artery.   DG Chest Portable 1 View  Result Date: 03/20/2020 CLINICAL DATA:  Chest pain. EXAM: PORTABLE CHEST 1 VIEW COMPARISON:  February 15, 2020 FINDINGS: No pneumothorax. Stable cardiomegaly. Diffuse interstitial opacities are identified. No nodules or masses. No focal infiltrates. IMPRESSION: Diffuse interstitial opacities suggestive of pulmonary edema given cardiomegaly. Atypical infection could have a similar appearance. Recommend clinical correlation. Electronically Signed   By: Dorise Bullion III M.D   On: 03/20/2020 08:08    EKG: Independently reviewed.  Normal sinus rhythm LBBB.  Time spent: 50 minutes.  Rise Patience [facility]  If 7PM-7AM, please contact night-coverage [email or web address] Password [facility] 03/22/2020, 6:27 AM

## 2020-03-22 NOTE — Consult Note (Signed)
Referring Provider: No ref. provider found Primary Care Physician:  [name], MD Primary Nephrologist:  Dr. [name]  Reason for Consultation: Acute on chronic renal insufficiency, maintenance of euvolemia, treatment and assessment of acid-base disorders, treatment and assessment of electrolyte abnormalities  HPI: This is a 64 year old gentleman with a history of CAD status post CABG in 2012, severe aortic stenosis right-sided heart cath 03/21/2020, carotid disease status post left-sided CEA 12/2019, diabetes mellitus type 2, CKD stage IV, anemia, mildly dilated aortic root, hypertension hyperlipidemia, PAD status post extensive lower extremity revascularization with vascular surgery 05/2019.  History of tobacco abuse obesity and psoriasis.  Underwent cardiac catheterization 03/21/2020 three-vessel coronary artery and severe aortic stenosis with multiple tree advanced pulmonary hypertension significant mitral regurgitation transferred to [facility] for possible TAVR plus RCA PCI.  His nephrologist is in [place] Dr. [name].  He has had conversations about dialysis with the patient in the past and the patient is willing to undergo preparations for hemodialysis.  Reviewing epic records it appears that patient has a creatinine of between 3.5 and 4.5 mg/dL.  Blood pressure 113/70 pulse 79 temperature 98 O2 sats 97% room air  Sodium 134 potassium 3.9 chloride 99 CO2 23 BUN 82 creatinine 4.47 glucose 210 calcium 8.5 hemoglobin 7.1  Patient is receiving 1 unit packed red blood cells 03/22/2020  Amlodipine 2.5 mg daily, aspirin 81 mg daily BuSpar 20 mg twice daily, calcitriol 0.25 mcg Monday Wednesday Friday Tums 1 with meals, Coreg 12.5 mg daily, Plavix 25 mg daily, Proscar 5 mg daily, Zoloft 100 mg daily, isosorbide 90 mg daily Flomax 0.4 mg daily      Past Medical History:  Diagnosis Date  . 3-vessel CAD    8/30 LHC with radial graft to RCA occluded at the ostium with critical  stenosis of the ostial native RCA.  . Adenomatous colon polyp   . Allergy   . Anxiety   . Arthritis    RA  . Bell's palsy 2007  . CAD (coronary artery disease)    03/21/2020 LHC with patent LIMA to LAD and SVG to OM 3.  Radial graft to RCA occluded at the ostium with critical stenosis of the ostial and native RCA.  . Carotid artery occlusion    Bilateral carotid stenosis s/p left-sided CEA.  . Cataract    Dr. Dawna Part  . CKD (chronic kidney disease), stage IV ([place])    03/2020 vascular surgery access planned  . Depression   . Diabetes mellitus   . Diverticulosis   . Duodenitis   . DVT (deep venous thrombosis) (H[facility])    in leg  . ESRD (end stage renal disease) ([place])   . Gastropathy   . GERD (gastroesophageal reflux disease)   . Heart failure with preserved ejection fraction ([place])    8/30 RHC with moderately to severely elevated filling pressures and pulmonary wedge pressure 31 mmHg, moderate pulmonary hypertension  . Helicobacter pylori gastritis   . Hx of CABG    2012  . Hyperlipidemia   . Hypertension   . Mitral regurgitation and aortic stenosis    RHC 8/30 and echo 12/2019.  [name] Neuropathy   . Obesity   . Peripheral vascular disease ([place])    S/p extensive revascularization by vascular surgery 05/2019  . Post splenectomy syndrome   . Psoriasis   . Severe aortic stenosis    03/21/20 RHC with peak gradient 27.4 mmHg and valve area 0.87.  [name] Sleep apnea    uses cpap  .  Stroke ([place])   . Tobacco use disorder    recently quit  . Tubular adenoma 08/2013   Dr. Hilarie Fredrickson  . Weak urinary stream     Past Surgical History:  Procedure Laterality Date  . CARDIAC CATHETERIZATION  08/2010   LAD: 80% ISR, RCA: 80% ostial, OM 80-90%  . CAROTID ENDARTERECTOMY  08/2010   left/ Dr. Kellie Simmering  . CATARACT EXTRACTION W/PHACO Left 07/09/2017   Procedure: CATARACT EXTRACTION PHACO AND INTRAOCULAR LENS PLACEMENT (IOC);  Surgeon: Birder Robson, MD;  Location: [facility] ORS;  Service: Ophthalmology;   Laterality: Left;  [place] 00:23 AP% 14.2 CDE 3.26 Fluid pack lot # [id number]  . CATARACT EXTRACTION W/PHACO Right 09/10/2017   Procedure: CATARACT EXTRACTION PHACO AND INTRAOCULAR LENS PLACEMENT (IOC);  Surgeon: Birder Robson, MD;  Location: [facility] ORS;  Service: Ophthalmology;  Laterality: Right;  [place] 00:26.4 AP% 17.3 CDE 4.59 Fluid Pack Lot # [id number]  . COLONOSCOPY    . CORONARY ANGIOPLASTY     LAD: before CABG  . CORONARY ARTERY BYPASS GRAFT  09/06/2010   At [facility]: LIMA to LAD, left radial to RCA, sequential SVG to OM3 and 4  . heart stents  Jan 2011   leg stents 06/2009 and 03/2010  . LOWER EXTREMITY ANGIOGRAPHY Left 06/09/2019   Procedure: LOWER EXTREMITY ANGIOGRAPHY;  Surgeon: Katha Cabal, MD;  Location: [place] [facility];  Service: Cardiovascular;  Laterality: Left;  . PTA of illiac and SFA  multiple   Dr. Ronalee Belts, s/p revision 11/2012  . RIGHT AND LEFT HEART CATH N/A 03/21/2020   Procedure: RIGHT AND LEFT HEART CATH possible percutaneous intervention;  Surgeon: Wellington Hampshire, MD;  Location: [place] [facility];  Service: Cardiovascular;  Laterality: N/A;  . RIGHT/LEFT HEART CATH AND CORONARY/GRAFT ANGIOGRAPHY N/A 02/15/2020   Procedure: RIGHT/LEFT HEART CATH AND CORONARY/GRAFT ANGIOGRAPHY;  Surgeon: Wellington Hampshire, MD;  Location: [place] [facility];  Service: Cardiovascular;  Laterality: N/A;  . SPLENECTOMY    . VASCULAR SURGERY     LEG STENTS    Prior to Admission medications   Medication Sig Start Date End Date Taking? Authorizing Provider  cyclobenzaprine (FLEXERIL) 10 MG tablet Take 10 mg by mouth 3 (three) times daily as needed for muscle spasms.   Yes [provider]  albuterol (PROAIR HFA) 108 (90 Base) MCG/ACT inhaler Inhale 2 puffs into the lungs every 6 (six) hours as needed for shortness of breath. 09/14/19   [name], MD  amLODipine (NORVASC) 2.5 MG tablet Take 1 tablet (2.5 mg total) by mouth daily. 02/19/20   Sharen Hones, MD  aspirin  81 MG tablet Take 81 mg by mouth daily.    [provider]  buPROPion (WELLBUTRIN SR) 150 MG 12 hr tablet Take 1 tablet (150 mg total) by mouth daily. Patient not taking: Reported on 03/20/2020 02/23/20   [name], MD  busPIRone (BUSPAR) 10 MG tablet TAKE 2 TABLETS(20 MG) BY MOUTH TWICE DAILY Patient taking differently: Take 20 mg by mouth 2 (two) times daily.  02/19/20   [name], MD  calcitRIOL (ROCALTROL) 0.25 MCG capsule Take 0.25 mcg by mouth every Monday, Wednesday, and Friday. Monday then Wednesday then Friday    [provider]  carvedilol (COREG) 12.5 MG tablet Take 1 tablet (12.5 mg total) by mouth 2 (two) times daily. Patient taking differently: Take 12.5 mg by mouth daily.  12/10/19   Wellington Hampshire, MD  Dextran 70-Hypromellose (ARTIFICIAL TEARS) 0.1-0.3 % SOLN Place 1  drop into both eyes 4 (four) times daily as needed (dry eyes).    [provider]  finasteride (PROSCAR) 5 MG tablet Take 5 mg by mouth daily.    [provider]  heparin 25000-0.45 UT/250ML-% infusion Inject 1,450 Units/hr into the vein continuous. 03/21/20   Nicole Kindred A, DO  insulin aspart (NOVOLOG) 100 UNIT/ML injection Inject 0-5 Units into the skin at bedtime. 03/21/20   Nicole Kindred A, DO  insulin aspart (NOVOLOG) 100 UNIT/ML injection Inject 0-9 Units into the skin 3 (three) times daily with meals. 03/21/20   Ezekiel Slocumb, DO  isosorbide mononitrate (IMDUR) 30 MG 24 hr tablet Take 3 tablets (90 mg total) by mouth daily. 02/23/20   Dunn, Areta Haber, PA-C  nitroGLYCERIN (NITROSTAT) 0.4 MG SL tablet Place 1 tablet (0.4 mg total) under the tongue every 5 (five) minutes as needed. Maximum of 3 doses. Patient taking differently: Place 0.4 mg under the tongue every 5 (five) minutes as needed for chest pain. Maximum of 3 doses. 02/23/20   [name], PA-C  nitroGLYCERIN 0.2 mg/mL infusion Inject 0-200 mcg/min into the vein continuous. 03/21/20   Ezekiel Slocumb, DO   predniSONE (DELTASONE) 50 MG tablet Take 1 tablet at 11 pm on night before procedure, take 1 tablet at 5 am the morning of procedure and take 1 tablet at 9:30 AM the morning of procedure 03/11/20   [name], PA-C  sertraline (ZOLOFT) 100 MG tablet Take 1 tablet (100 mg total) by mouth daily. 02/23/20   [name], MD  simvastatin (ZOCOR) 20 MG tablet TAKE 1 TABLET(20 MG) BY MOUTH DAILY AT 6 PM Patient taking differently: Take 20 mg by mouth every evening.  11/20/19   Wellington Hampshire, MD  tamsulosin (FLOMAX) 0.4 MG CAPS capsule Take 1 capsule by mouth daily Patient taking differently: Take 0.4 mg by mouth daily.  01/20/20   [name], MD  torsemide (DEMADEX) 20 MG tablet Take 2 tablets (40 mg total) by mouth daily. 02/24/20   [name], MD    Current Facility-Administered Medications  Medication Dose Route Frequency Provider Last Rate Last Admin  . 0.9 %  sodium chloride infusion (Manually program via Guardrails IV Fluids)   Intravenous Once Barrett, Rhonda G, PA-C      . 0.9 %  sodium chloride infusion  250 mL Intravenous PRN Fay Records, MD      . acetaminophen (TYLENOL) tablet 650 mg  650 mg Oral Q4H PRN Fay Records, MD   650 mg at 03/22/20 0151  . albuterol (PROVENTIL) (2.5 MG/3ML) 0.083% nebulizer solution 3 mL  3 mL Inhalation Q6H PRN Fay Records, MD      . alum & mag hydroxide-simeth (MAALOX/MYLANTA) 200-200-20 MG/5ML suspension 30 mL  30 mL Oral Q6H PRN Fay Records, MD   30 mL at 03/22/20 0151  . amLODipine (NORVASC) tablet 2.5 mg  2.5 mg Oral Daily Fay Records, MD   2.5 mg at 03/22/20 1884  . aspirin EC tablet 81 mg  81 mg Oral Daily Fay Records, MD   81 mg at 03/22/20 0855  . busPIRone (BUSPAR) tablet 20 mg  20 mg Oral BID Skeet Latch, MD   20 mg at 03/22/20 1660  . [START ON 03/23/2020] calcitRIOL (ROCALTROL) capsule 0.25 mcg  0.25 mcg Oral Q M,W,F Fay Records, MD      . calcium carbonate (TUMS - dosed in mg elemental calcium) chewable  tablet  250 mg  250 mg Oral TID Skeet Latch, MD   250 mg at 03/22/20 0853  . carvedilol (COREG) tablet 12.5 mg  12.5 mg Oral Daily Fay Records, MD   12.5 mg at 03/22/20 4081  . clopidogrel (PLAVIX) tablet 75 mg  75 mg Oral Daily Fay Records, MD   75 mg at 03/22/20 4481  . cyclobenzaprine (FLEXERIL) tablet 10 mg  10 mg Oral TID PRN Barrett, Rhonda G, PA-C   10 mg at 03/22/20 1309  . finasteride (PROSCAR) tablet 5 mg  5 mg Oral Daily Fay Records, MD   5 mg at 03/22/20 0853  . furosemide (LASIX) 160 mg in dextrose 5 % 50 mL IVPB  160 mg Intravenous Once Barrett, Rhonda G, PA-C      . heparin ADULT infusion 100 units/mL (25000 units/280mL sodium chloride 0.45%)  1,450 Units/hr Intravenous Continuous Fay Records, MD 14.5 mL/hr at 03/22/20 0916 1,450 Units/hr at 03/22/20 0916  . hydroxypropyl methylcellulose / hypromellose (ISOPTO TEARS / GONIOVISC) 2.5 % ophthalmic solution 1 drop  1 drop Both Eyes QID PRN Pokhrel, Laxman, MD      . insulin aspart (novoLOG) injection 0-5 Units  0-5 Units Subcutaneous QHS Dorris Carnes V, MD      . insulin aspart (novoLOG) injection 0-9 Units  0-9 Units Subcutaneous TID WC Fay Records, MD   2 Units at 03/22/20 1211  . isosorbide mononitrate (IMDUR) 24 hr tablet 90 mg  90 mg Oral Daily Fay Records, MD   90 mg at 03/22/20 0851  . nitroGLYCERIN (NITROSTAT) SL tablet 0.4 mg  0.4 mg Sublingual Q5 Min x 3 PRN Dorris Carnes V, MD      . nitroGLYCERIN 50 mg in dextrose 5 % 250 mL (0.2 mg/mL) infusion  0-200 mcg/min Intravenous Continuous Fay Records, MD 9 mL/hr at 03/22/20 0513 30 mcg/min at 03/22/20 0513  . ondansetron (ZOFRAN) injection 4 mg  4 mg Intravenous Q6H PRN Fay Records, MD      . sertraline (ZOLOFT) tablet 100 mg  100 mg Oral QHS Skeet Latch, MD   100 mg at 03/22/20 0414  . sodium chloride flush (NS) 0.9 % injection 3 mL  3 mL Intravenous Q12H Fay Records, MD   3 mL at 03/22/20 0158  . sodium chloride flush (NS) 0.9 % injection 3 mL  3 mL Intravenous  PRN Fay Records, MD      . tamsulosin [facility]) capsule 0.4 mg  0.4 mg Oral Daily Fay Records, MD   0.4 mg at 03/22/20 8563    Allergies as of 03/21/2020 - Review Complete 03/21/2020  Allergen Reaction Noted  . Contrast media [iodinated diagnostic agents] Rash and Other (See Comments) 08/06/2011  . Glipizide Other (See Comments) 03/05/2011  . Hydroxychloroquine Other (See Comments) 03/11/2020  . Metrizamide Other (See Comments) 03/25/2014  . Penicillins Hives and Swelling 08/23/2010    Family History  Problem Relation Age of Onset  . Lung cancer Father 16  . Arthritis Father   . Breast cancer Sister 5  . Alcoholism Sister   . Dementia Mother   . Alzheimer's disease Mother   . Arthritis Mother   . Alcoholism Brother   . Epilepsy Sister   . Diabetes Paternal Grandfather   . Colon polyps Paternal Grandfather 51  . Alcoholism Sister   . Alcoholism Sister   . Alcoholism Brother     Social History   Socioeconomic History  .  Marital status: Widowed    Spouse name: Not on file  . Number of children: 2  . Years of education: Not on file  . Highest education level: Not on file  Occupational History  . Occupation: Chief Operating Officer at [employer]: Disabled mostly due to neuropathy  Tobacco Use  . Smoking status: Former Smoker    Packs/day: 0.75    Years: 40.00    Pack years: 30.00    Types: Cigarettes    Quit date: 04/15/2017    Years since quitting: 2.9  . Smokeless tobacco: Never Used  . Tobacco comment: 1 cigarette a day  Vaping Use  . Vaping Use: Never used  Substance and Sexual Activity  . Alcohol use: No  . Drug use: No  . Sexual activity: Never  Other Topics Concern  . Not on file  Social History Narrative   Wife died 2019-02-05      Has living will   Gearldine Shown and his wife Levada Dy should be his health care POA   Would accept resuscitation --but no prolonged ventilation   No tube feeds if cognitively unaware   Social Determinants of Health    Financial Resource Strain: Low Risk   . Difficulty of Paying Living Expenses: Not very hard  Food Insecurity: No Food Insecurity  . Worried About Charity fundraiser in the Last Year: Never true  . Ran Out of Food in the Last Year: Never true  Transportation Needs: No Transportation Needs  . Lack of Transportation (Medical): No  . Lack of Transportation (Non-Medical): No  Physical Activity:   . Days of Exercise per Week: Not on file  . Minutes of Exercise per Session: Not on file  Stress: No Stress Concern Present  . Feeling of Stress : Only a little  Social Connections: Unknown  . Frequency of Communication with Friends and Family: More than three times a week  . Frequency of Social Gatherings with Friends and Family: Not on file  . Attends Religious Services: Not on file  . Active Member of Clubs or Organizations: Not on file  . Attends Archivist Meetings: Not on file  . Marital Status: Not on file  Intimate Partner Violence: Unknown  . Fear of Current or Ex-Partner: No  . Emotionally Abused: No  . Physically Abused: No  . Sexually Abused: Not on file    Review of Systems: Gen: Admits to fatigue and weakness and exertional dyspnea HEENT: No visual complaints, No history of Retinopathy. Normal external appearance No Epistaxis or Sore throat. No sinusitis.   CV: Admits to dyspnea on exertion no orthopnea or PND symptoms.  Status post cardiac catheterization with RCA stenosis and severe aortic stenosis. Resp: Denies dyspnea at rest, dyspnea with exercise, cough, sputum, wheezing, coughing up blood, and pleurisy. GI: Denies vomiting blood, jaundice, and fecal incontinence.   Denies dysphagia or odynophagia. GU : Denies urinary burning, blood in urine, urinary frequency, urinary hesitancy, nocturnal urination, and urinary incontinence.  No renal calculi. MS: Denies joint pain, limitation of movement, and swelling, stiffness, low back pain, extremity pain. Denies muscle  weakness, cramps, atrophy.  No use of non steroidal antiinflammatory drugs. Derm: Denies rash, itching, dry skin, hives, moles, warts, or unhealing ulcers.  Psych: Denies depression, anxiety, memory loss, suicidal ideation, hallucinations, paranoia, and confusion. Heme: Denies bruising, bleeding, and enlarged lymph nodes. Neuro: No headache.  No diplopia. No dysarthria.  No dysphasia.  No history of CVA.  No Seizures.  No paresthesias.  No weakness. Endocrine history of diabetes mellitus type 2.  No Thyroid disease.  No Adrenal disease.  Physical Exam: Vital signs in last 24 hours: Temp:  [97.5 F (36.4 C)-98.5 F (36.9 C)] 98 F (36.7 C) (08/31 1300) Pulse Rate:  [81-106] 81 (08/31 1215) Resp:  [13-20] 14 (08/31 1215) BP: (113-151)/(66-85) 113/70 (08/31 1215) SpO2:  [94 %-100 %] 97 % (08/31 1215) Weight:  [106.1 kg-106.2 kg] 106.1 kg (08/31 0500) Last BM Date: 03/21/20 General:   Alert,  Well-developed, well-nourished, pleasant and cooperative in NAD Head:  Normocephalic and atraumatic. Eyes:  Sclera clear, no icterus.   Conjunctiva pink. Ears:  Normal auditory acuity. Nose:  No deformity, discharge,  or lesions. Mouth:  No deformity or lesions, dentition normal. Neck:  Supple; no masses or thyromegaly. JVP not elevated Lungs: Diminished breath sounds in the left.   No wheezes, crackles, or rhonchi. No acute distress. Heart:  Regular rate and rhythm loud systolic crescendo decrescendo murmur loudest over aortic area Abdomen:  Soft, nontender and nondistended. No masses, hepatosplenomegaly or hernias noted. Normal bowel sounds, without guarding, and without rebound.   Msk:  Symmetrical without gross deformities. Normal posture. Pulses: Carotid pulses diminished Extremities:  Without clubbing or edema. Neurologic:  Alert and  oriented x4;  grossly normal neurologically. Skin:  Intact without significant lesions or rashes. Cervical Nodes:  No significant cervical adenopathy. Psych:   Alert and cooperative. Normal mood and affect.  Intake/Output from previous day: 08/30 0701 - 08/31 0700 In: 555.5 [P.O.:480; I.V.:75.5] Out: 1200 [Urine:1200] Intake/Output this shift: No intake/output data recorded.  Lab Results: Recent Labs    03/20/20 0620 03/21/20 0341 03/22/20 0654  WBC 6.6 5.9 8.7  HGB 8.0* 7.8* 7.1*  HCT 24.3* 23.9* 22.5*  PLT 125* 125* 135*   BMET Recent Labs    03/20/20 0620 03/20/20 0919 03/22/20 0654  NA 138  --  134*  K 3.8  --  3.9  CL 105  --  99  CO2 20*  --  23  GLUCOSE 178*  --  210*  BUN 63*  --  82*  CREATININE 4.21* 4.58* 4.47*  CALCIUM 7.8*  --  8.5*   LFT No results for input(s): PROT, ALBUMIN, AST, ALT, ALKPHOS, BILITOT, BILIDIR, IBILI in the last 72 hours. PT/INR Recent Labs    03/20/20 0620  LABPROT 14.0  INR 1.1   Hepatitis Panel No results for input(s): HEPBSAG, HCVAB, HEPAIGM, HEPBIGM in the last 72 hours.  Studies/Results: CARDIAC CATHETERIZATION  Result Date: 03/21/2020  Colon [place]attery Cx to Prox Cx lesion is 85% stenosed.  Mid Cx to Dist Cx lesion is 100% stenosed.  1st Mrg lesion is 70% stenosed.  Prox LAD to Mid LAD lesion is 90% stenosed.  Ost RCA to Prox RCA lesion is 99% stenosed.  SVG.  The graft exhibits mild diffuse disease.  Left radial artery graft was visualized by angiography.  Origin lesion is 100% stenosed.  LIMA graft was visualized by non-selective angiography and is normal in caliber.  The graft exhibits no disease.  1.  Significant underlying three-vessel coronary artery disease with patent LIMA to LAD and SVG to OM 3.  Radial graft to RCA is occluded at the ostium with critical stenosis of the ostial native right coronary artery.  This is the likely culprit for recent non-STEMI and current unstable angina. 2.  Severe aortic stenosis with peak gradient of 27.4 mmHg and valve area of 0.87. 3.  Right heart catheterization showed moderately  to severely elevated filling pressures with pulmonary wedge  pressure of 31 mmHg, moderate pulmonary hypertension at 50/30 mmHg and normal cardiac output at 5.06 with a cardiac index of 2.36.  Prominent V waves on wedge pressure tracing suggestive of significant mitral regurgitation. Recommendations: This is an overall difficult case.  The patient will need to be evaluated for TAVR plus RCA PCI.  Advanced chronic kidney disease and anemia in addition to significant heart failure make timing of this difficult.  The patient will likely need to be initiated on hemodialysis in order to improve his volume status.  He needs blood transfusion to keep his hemoglobin above 9.  Nonetheless, he might not be able to tolerate large volume shifts due to significant coronary artery disease and aortic stenosis. I will discuss with our structural heart team.  It might be best to transfer the patient to [facility]. Vascular access on this patient is very difficult due to extensive stenting and calcifications of the lower extremities.  The SFA stent on the right side might be extending into the common femoral artery.  Arterial access might be easier via the left femoral artery.    Assessment/Plan:  Chronic kidney injury stage IV/V.  Patient is at high risk of contrast associated nephropathy.  His renal function is obtained relatively stable after having had cardiac catheterization 03/21/2020.  The etiology of his chronic renal insufficiency appears to be diabetic nephropathy.  He is followed by nephrologist.  Outpatient dialysis planning is currently underway.  Patient was on losartan and this is being held.  No evidence of use of nephrotoxic agents at this time except for the use of IV contrast 03/21/2020.  Patient appears to tolerated this fairly well.  Further management will be outpatient consultation between Dr. [name] and the cardiology team.  Hypertension appears to be adequately controlled continues on carvedilol amlodipine and tamsulosin.  I would favor restarting his  home dose of Lasix and holding his losartan prior to discharge  Anemia symptomatic with acute coronary syndrome receiving 1 unit packed red blood cells.  This can be followed up by his nephrologist  Secondary hyperparathyroidism PTH 118 03/10/2020 patient is currently using calcitriol.  Thank you very much for this most interesting consult.   LOS: [place] @TODAY @2 :17 PM

## 2020-03-22 NOTE — Consult Note (Signed)
[place] for heparin  Indication: chest pain/ACS  Allergies  Allergen Reactions  . Contrast Media [Iodinated Diagnostic Agents] Rash and Other (See Comments)    Got very hot and red   . Glipizide Other (See Comments)    ANTIDIABETICS. Burning  . Hydroxychloroquine Other (See Comments)    Stomach upset  . Metrizamide Other (See Comments)    Got very hot and red  . Penicillins Hives and Swelling    Has patient had a PCN reaction causing immediate rash, facial/tongue/throat swelling, SOB or lightheadedness with hypotension: yes Has patient had a PCN reaction causing severe rash involving mucus membranes or skin necrosis: no  Has patient had a PCN reaction that required hospitalization: yes Has patient had a PCN reaction occurring within the last 10 years: no If all of the above answers are "NO", then may proceed with Cephalosporin use.     Patient Measurements: Height: 5\' 6"  (167.6 cm) Weight: 106.1 kg (233 lb 14.4 oz) (a) IBW/kg (Calculated) : 63.8 Heparin Dosing Weight: 86.4  Vital Signs: Temp: 97.8 F (36.6 C) (08/31 0747) Temp Source: Oral (08/31 0147) BP: 145/84 (08/31 0747) Pulse Rate: 89 (08/31 0747)  Labs: Recent Labs    03/20/20 0620 03/20/20 3007 03/20/20 0827 03/20/20 0919 03/20/20 1255 03/20/20 1752 03/21/20 0341 03/22/20 0654  HGB 8.0*   < >  --   --   --   --  7.8* 7.1*  HCT 24.3*  --   --   --   --   --  23.9* 22.5*  PLT 125*  --   --   --   --   --  125* 135*  APTT 34  --   --   --   --   --   --   --   LABPROT 14.0  --   --   --   --   --   --   --   INR 1.1  --   --   --   --   --   --   --   HEPARINUNFRC  --   --   --   --   --  0.17* 0.55 0.59  CREATININE 4.21*  --   --  4.58*  --   --   --  4.47*  TROPONINIHS 19*  --  51*  --  86*  --   --   --    < > = values in this interval not displayed.    Estimated Creatinine Clearance: 18.6 mL/min (A) (by C-G formula based on SCr of 4.47 mg/dL  (H)).   Assessment: 64 year old male with CAD s/p cath, awaiting PCI 8/31, for heparin.  Heparin restarted after cath at ~2030 at [facility].  Not on anticoagulation prior to [facility] admission.   Heparin level = 0.59  remains therapeutic x2 on heparin infusiuon 1450 units/hr.  Anemia likely related to renal disease.   Hgb 7.8>7.1, pltc 125>135.  No bleeding reported.    Goal of Therapy:  Heparin level 0.3-0.7 units/ml Monitor platelets by anticoagulation protocol: Yes   Plan:  Continue Heparin 1450 units/hr Daily heparin level and CBC Follow up for PCI plan  Nicole Cella, RPh Clinical Pharmacist Please check AMION for all [place] phone numbers After 10:00 PM, call [place] [phone]  8:00 AM 03/22/2020

## 2020-03-22 NOTE — Consult Note (Addendum)
[facility] VALVE TEAM  Inpatient TAVR Consultation:   Patient ID: [name] [name]; [id number]; 10/26/1955   Admit date: 03/21/2020 Date of Consult: 03/22/2020  Primary Care Provider: [name], MD Primary Cardiologist: Dr. Fletcher Anon    Patient Profile:   [name] [name] is a 64 y.o. male with a hx of CAD s/p CABG in 2012, moderate aortic stenosis, LBBB, carotid artery disease status post left-sided CEA, DM2, CKD stage IV, anemia of chronic disease, HTN, HLD, PAD status post extensive lower extremity revascularization by vascular surgery in 07/930, chronic diastolic heart failure, tobacco use with recent cessation, morbid obesity, and psoriasiswho is being seen today for the evaluation of severe AS and CAD requiring revascularization at the request of Dr. Fletcher Anon.  History of Present Illness:   [name] lives in [place], [place] with his son.  He previously worked in [employer] but went on disability in 2008 due to his peripheral neuropathy and chronic pain.  He has become very sedentary over the past 5 years and does very little activity.  He walks with the aid of a walker.  He no longer drives.  However, he does wish to become more active and requests aggressive medical intervention.  Of note, the patient has full dentures.  The patient has a history of aortic stenosis followed by Dr. Fletcher Anon in [place].  Echo in 12/2018 showed normal LV systolic function with moderate aortic stenosis with an aortic valve area 1.28 cm and a mean gradient of 20 mmHg and a mildly dilated aortic root at 4 cm.   Most recent echo in 12/2019 demonstrated low normal EF of 50 to 55%, no regional wall motion abnormalities, mild LVH, grade 2 diastolic dysfunction, normal RV systolic function and RV cavity size, mildly dilated left atrium, mild to moderate mitral regurgitation, moderate aortic stenosis with a valve area of 1.06 cm, and a mean gradient of 26 mm. This  was felt to be in the moderate range and continued follow up was recommended.   He was seen in the ED on 02/08/2020 with flank pain.  CT abdomen was unremarkable and labs were unrevealing.  He was admitted from 7/24-7/29/21 with NSTEMI, HS troponin peaked at 470.  Plan was to have the patient have a L/RHC, however this was canceled given acute respiratory distress secondary to acute heart failure requiring Lasix gtt.  The patient was insistent on going home and cath was ultimately deferred given worsening renal function with a discharge creatinine of 4.41.  He is followed by nephrology and was scheduled with Dr. Delana Meyer for a permcath insertion on 03/23/20.  He continued to have periodic chest pain and his primary cardiologist recommended optimization of medical therapy given advanced renal dysfunction and his isosorbide was titrated to 90 mg. Of note, he had historical orthostasis with escalation of amlodipine and carvedilol.    He was seen in the office by Christell Faith PA-C on 03/11/2020 for evaluation of accelerated angina with symptoms of fatigue and exertional angina requiring nitroglycerin daily or every other day for anginal symptoms, described the symptoms as " an elephant sitting on his chest."  High-sensitivity troponin was ordered and returned normal at 15. He was scheduled for [facility] knowing that it might push him him into renal failure requiring hemodialysis.   He then developed chest pain on 03/11/20 that did not respond to [facility] NTG and EMS was called. He was brought to [facility] and started on IV nitro and  heparin. HS Troponin 05--> 19--> 51--> 86.   Of note, he is found to have worsening anemia with a hemoglobin down to 7.1 today. 1 U PRBCs ordered. He underwent L/RHC on 03/21/20 which showed significant underlying three-vessel coronary artery disease with patent LIMA to LAD and SVG to OM 3.  The radial graft to RCA is occluded at the ostium with critical stenosis of the ostial native right coronary  artery.  This was felt to be the culprit for his current unstable angina and recent NSTEMI.  There was also severe aortic stenosis with a peak gradient of 27.4 and valve area of 0.87.  Right heart catheterization showed moderately to severely elevated filling pressures with a pulmonary wedge pressure of 31 mmHg, moderate pulmonary hypertension at 50/30 mmHg and normal cardiac output at 5.06 with a cardiac index of 2.36. There were prominent V waves on wedge pressure tracing suggestive of significant mitral regurgitation.  Given advanced chronic kidney disease and worsening anemia as well as significant heart failure and severe aortic stenosis, it was decided to transfer him to [facility] health for evaluation by the structural heart team.  Of note, the patient had very difficult vascular access due to extensive stenting and calcifications in the lower extremities. Repeat echo was completed today but pending formal read.  Today he is seen sitting up in bed.  He reports chronic lower extremity edema, orthopnea and PND.  He reports ongoing fatigue and chest pain worse over the last couple months.  He wishes to be more active but is limited by fatigue, shortness of breath and chest pain.  Past Medical History:  Diagnosis Date  . 3-vessel CAD    8/30 LHC with radial graft to RCA occluded at the ostium with critical stenosis of the ostial native RCA.  . Adenomatous colon polyp   . Allergy   . Anxiety   . Arthritis    RA  . Bell's palsy 2007  . CAD (coronary artery disease)    03/21/2020 LHC with patent LIMA to LAD and SVG to OM 3.  Radial graft to RCA occluded at the ostium with critical stenosis of the ostial and native RCA.  . Carotid artery occlusion    Bilateral carotid stenosis s/p left-sided CEA.  . Cataract    Dr. Dawna Part  . CKD (chronic kidney disease), stage IV ([place])    03/2020 vascular surgery access planned  . Depression   . Diabetes mellitus   . Diverticulosis   . Duodenitis   . DVT (deep  venous thrombosis) (HCC)    in leg  . Gastropathy   . GERD (gastroesophageal reflux disease)   . Heart failure with preserved ejection fraction ([place])    8/30 RHC with moderately to severely elevated filling pressures and pulmonary wedge pressure 31 mmHg, moderate pulmonary hypertension  . Helicobacter pylori gastritis   . Hx of CABG    2012  . Hyperlipidemia   . Hypertension   . Mitral regurgitation and aortic stenosis    RHC 8/30 and echo 12/2019.  [name] Neuropathy   . Obesity   . Peripheral vascular disease ([place])    S/p extensive revascularization by vascular surgery 05/2019  . Post splenectomy syndrome   . Psoriasis   . Psoriatic arthritis ([place])   . Renal disorder    stage 4 kidney failure  . Severe aortic stenosis    03/21/20 RHC with peak gradient 27.4 mmHg and valve area 0.87.  [name] Sleep apnea    uses cpap  .  Stroke ([place])   . Tobacco use disorder    recently quit  . Tubular adenoma 08/2013   Dr. Hilarie Fredrickson  . Weak urinary stream     Past Surgical History:  Procedure Laterality Date  . CARDIAC CATHETERIZATION  08/2010   LAD: 80% ISR, RCA: 80% ostial, OM 80-90%  . CAROTID ENDARTERECTOMY  08/2010   left/ Dr. Kellie Simmering  . CATARACT EXTRACTION W/PHACO Left 07/09/2017   Procedure: CATARACT EXTRACTION PHACO AND INTRAOCULAR LENS PLACEMENT (IOC);  Surgeon: Birder Robson, MD;  Location: [facility] ORS;  Service: Ophthalmology;  Laterality: Left;  [place] 00:23 AP% 14.2 CDE 3.26 Fluid pack lot # [id number]  . CATARACT EXTRACTION W/PHACO Right 09/10/2017   Procedure: CATARACT EXTRACTION PHACO AND INTRAOCULAR LENS PLACEMENT (IOC);  Surgeon: Birder Robson, MD;  Location: [facility] ORS;  Service: Ophthalmology;  Laterality: Right;  [place] 00:26.4 AP% 17.3 CDE 4.59 Fluid Pack Lot # [id number]  . COLONOSCOPY    . CORONARY ANGIOPLASTY     LAD: before CABG  . CORONARY ARTERY BYPASS GRAFT  09/06/2010   At [facility]: LIMA to LAD, left radial to RCA, sequential SVG to OM3 and 4  . heart stents  Jan 2011   leg  stents 06/2009 and 03/2010  . LOWER EXTREMITY ANGIOGRAPHY Left 06/09/2019   Procedure: LOWER EXTREMITY ANGIOGRAPHY;  Surgeon: Katha Cabal, MD;  Location: [place] CV LAB;  Service: Cardiovascular;  Laterality: Left;  . PTA of illiac and SFA  multiple   Dr. Ronalee Belts, s/p revision 11/2012  . RIGHT AND LEFT HEART CATH N/A 03/21/2020   Procedure: RIGHT AND LEFT HEART CATH possible percutaneous intervention;  Surgeon: Wellington Hampshire, MD;  Location: [place] CV LAB;  Service: Cardiovascular;  Laterality: N/A;  . RIGHT/LEFT HEART CATH AND CORONARY/GRAFT ANGIOGRAPHY N/A 02/15/2020   Procedure: RIGHT/LEFT HEART CATH AND CORONARY/GRAFT ANGIOGRAPHY;  Surgeon: Wellington Hampshire, MD;  Location: [place] CV LAB;  Service: Cardiovascular;  Laterality: N/A;  . SPLENECTOMY    . VASCULAR SURGERY     LEG STENTS     Inpatient Medications: Scheduled Meds: . sodium chloride   Intravenous Once  . amLODipine  2.5 mg Oral Daily  . aspirin EC  81 mg Oral Daily  . busPIRone  20 mg Oral BID  . [START ON 03/23/2020] calcitRIOL  0.25 mcg Oral Q M,W,F  . calcium carbonate  250 mg Oral TID  . carvedilol  12.5 mg Oral Daily  . clopidogrel  75 mg Oral Daily  . diphenhydrAMINE  25 mg Oral Once  . finasteride  5 mg Oral Daily  . insulin aspart  0-5 Units Subcutaneous QHS  . insulin aspart  0-9 Units Subcutaneous TID WC  . isosorbide mononitrate  90 mg Oral Daily  . sertraline  100 mg Oral QHS  . sodium chloride flush  3 mL Intravenous Q12H  . tamsulosin  0.4 mg Oral Daily   Continuous Infusions: . sodium chloride    . furosemide    . heparin 1,450 Units/hr (03/22/20 0916)  . nitroGLYCERIN 30 mcg/min (03/22/20 0513)   PRN Meds: sodium chloride, acetaminophen, albuterol, alum & mag hydroxide-simeth, hydroxypropyl methylcellulose / hypromellose, nitroGLYCERIN, ondansetron (ZOFRAN) IV, sodium chloride flush  Allergies:    Allergies  Allergen Reactions  . Contrast Media [Iodinated Diagnostic  Agents] Rash and Other (See Comments)    Got very hot and red   . Glipizide Other (See Comments)    ANTIDIABETICS. Burning  . Hydroxychloroquine Other (See Comments)    Stomach upset  .  Metrizamide Other (See Comments)    Got very hot and red  . Penicillins Hives and Swelling    Has patient had a PCN reaction causing immediate rash, facial/tongue/throat swelling, SOB or lightheadedness with hypotension: yes Has patient had a PCN reaction causing severe rash involving mucus membranes or skin necrosis: no  Has patient had a PCN reaction that required hospitalization: yes Has patient had a PCN reaction occurring within the last 10 years: no If all of the above answers are "NO", then may proceed with Cephalosporin use.     Social History:   Social History   Socioeconomic History  . Marital status: Widowed    Spouse name: Not on file  . Number of children: 2  . Years of education: Not on file  . Highest education level: Not on file  Occupational History  . Occupation: Chief Operating Officer at [employer]: Disabled mostly due to neuropathy  Tobacco Use  . Smoking status: Former Smoker    Packs/day: 0.75    Years: 40.00    Pack years: 30.00    Types: Cigarettes    Quit date: 04/15/2017    Years since quitting: 2.9  . Smokeless tobacco: Never Used  . Tobacco comment: 1 cigarette a day  Vaping Use  . Vaping Use: Never used  Substance and Sexual Activity  . Alcohol use: No  . Drug use: No  . Sexual activity: Never  Other Topics Concern  . Not on file  Social History Narrative   Wife died 2019/02/06      Has living will   Gearldine Shown and his wife Levada Dy should be his health care POA   Would accept resuscitation --but no prolonged ventilation   No tube feeds if cognitively unaware   Social Determinants of Health   Financial Resource Strain: Low Risk   . Difficulty of Paying Living Expenses: Not very hard  Food Insecurity: No Food Insecurity  . Worried About Ship broker in the Last Year: Never true  . Ran Out of Food in the Last Year: Never true  Transportation Needs: No Transportation Needs  . Lack of Transportation (Medical): No  . Lack of Transportation (Non-Medical): No  Physical Activity:   . Days of Exercise per Week: Not on file  . Minutes of Exercise per Session: Not on file  Stress: No Stress Concern Present  . Feeling of Stress : Only a little  Social Connections: Unknown  . Frequency of Communication with Friends and Family: More than three times a week  . Frequency of Social Gatherings with Friends and Family: Not on file  . Attends Religious Services: Not on file  . Active Member of Clubs or Organizations: Not on file  . Attends Archivist Meetings: Not on file  . Marital Status: Not on file  Intimate Partner Violence: Unknown  . Fear of Current or Ex-Partner: No  . Emotionally Abused: No  . Physically Abused: No  . Sexually Abused: Not on file    Family History:   The patient's family history includes Alcoholism in his brother, brother, sister, sister, and sister; Alzheimer's disease in his mother; Arthritis in his father and mother; Breast cancer (age of onset: 26) in his sister; Colon polyps (age of onset: 60) in his paternal grandfather; Dementia in his mother; Diabetes in his paternal grandfather; Epilepsy in his sister; Lung cancer (age of onset: 31) in his father.  ROS:  Please see the history of present  illness.  ROS  All other ROS reviewed and negative.     Physical Exam/Data:   Vitals:   03/22/20 0143 03/22/20 0147 03/22/20 0500 03/22/20 0747  BP: 125/66   (!) 145/84  Pulse:  92  89  Resp:  20    Temp:  (!) 97.5 F (36.4 C)  97.8 F (36.6 C)  TempSrc:  Oral    SpO2:  98%  94%  Weight:   106.1 kg   Height:        Intake/Output Summary (Last 24 hours) at 03/22/2020 1104 Last data filed at 03/22/2020 0513 Gross per 24 hour  Intake 555.48 ml  Output 1200 ml  Net -644.52 ml   Filed Weights    03/21/20 2123 03/22/20 0500  Weight: 106.2 kg 106.1 kg   Body mass index is 37.75 kg/m.  General:  Well nourished, well developed, in no acute distress, morbidly obese HEENT: normal Lymph: no adenopathy Neck: no JVD Endocrine:  No thryoid Cardiac:  normal S1, S2; RRR; 3/6 harsh systolic ejection murmur Lungs:  decreased breath sounds Abd: soft, nontender, no hepatomegaly  Ext: 2+ bilateral pitting edema Musculoskeletal:  No deformities, BUE and BLE strength normal and equal Skin: warm and dry  Neuro:  CNs 2-12 intact, no focal abnormalities noted Psych:  Normal affect   EKG:  The EKG was personally reviewed and demonstrates: Sinus with left bundle branch block and LAD, heart rate 95 Telemetry:  Telemetry was personally reviewed and demonstrates: Sinus Relevant CV Studies:  Echo 01/12/20 IMPRESSIONS  1. Left ventricular ejection fraction, by estimation, is 50 to 55%. The  left ventricle has low normal function. The left ventricle has no regional  wall motion abnormalities. There is mild left ventricular hypertrophy.  Left ventricular diastolic  parameters are consistent with Grade II diastolic dysfunction  (pseudonormalization).  2. Right ventricular systolic function is normal. The right ventricular  size is normal. There is moderately elevated pulmonary artery systolic  pressure.  3. Left atrial size was mildly dilated.  4. The mitral valve is abnormal. Mild to moderate mitral valve  regurgitation. No evidence of mitral stenosis.  5. The aortic valve is abnormal. Aortic valve regurgitation is not  visualized. Moderate aortic valve stenosis. Aortic valve area, by VTI  measures 1.06 cm. Aortic valve mean gradient measures 26.0 mmHg.  6. Aortic dilatation noted. There is mild dilatation of the ascending  aorta measuring 41 mm.  7. The inferior vena cava is normal in size with <50% respiratory  variability, suggesting right atrial pressure of 8 mmHg.   Comparison(s):  Previous Echo showed normal LV systolic function with  moderate aortic stenosis and mildly dilated aortic root at 4 cm. Aortic  valve area was 1.28 cm.     FINDINGS  Left Ventricle: Left ventricular ejection fraction, by estimation, is 50  to 55%. The left ventricle has low normal function. The left ventricle has  no regional wall motion abnormalities. The left ventricular internal  cavity size was normal in size.  There is mild left ventricular hypertrophy. Left ventricular diastolic  parameters are consistent with Grade II diastolic dysfunction  (pseudonormalization).   Right Ventricle: The right ventricular size is normal. No increase in  right ventricular wall thickness. Right ventricular systolic function is  normal. There is moderately elevated pulmonary artery systolic pressure.  The tricuspid regurgitant velocity is  3.06 m/s, and with an assumed right atrial pressure of 8 mmHg, the  estimated right ventricular systolic pressure is 30.0 mmHg.  Left Atrium: Left atrial size was mildly dilated.   Right Atrium: Right atrial size was normal in size.   Pericardium: There is no evidence of pericardial effusion.   Mitral Valve: The mitral valve is abnormal. There is moderate thickening  of the mitral valve leaflet(s). There is mild calcification of the mitral  valve leaflet(s). Normal mobility of the mitral valve leaflets. Moderate  mitral annular calcification. Mild  to moderate mitral valve regurgitation. No evidence of mitral valve  stenosis. MV peak gradient, 10.4 mmHg. The mean mitral valve gradient is  5.0 mmHg.   Tricuspid Valve: The tricuspid valve is normal in structure. Tricuspid  valve regurgitation is mild . No evidence of tricuspid stenosis.   Aortic Valve: The aortic valve is abnormal. Aortic valve regurgitation is  not visualized. Moderate aortic stenosis is present. Aortic valve mean  gradient measures 26.0 mmHg. Aortic valve peak gradient measures 37.3    mmHg. Aortic valve area, by VTI  measures 1.06 cm.   Pulmonic Valve: The pulmonic valve was normal in structure. Pulmonic valve  regurgitation is not visualized. No evidence of pulmonic stenosis.   Aorta: Aortic dilatation noted. There is mild dilatation of the ascending  aorta measuring 41 mm.   Venous: The inferior vena cava is normal in size with less than 50%  respiratory variability, suggesting right atrial pressure of 8 mmHg.   IAS/Shunts: No atrial level shunt detected by color flow Doppler.     LEFT VENTRICLE  PLAX 2D  LVIDd:     5.30 cm   Diastology  LVIDs:     4.40 cm   LV e' lateral:  7.29 cm/s  LV PW:     1.50 cm   LV E/e' lateral: 22.8  LV IVS:    1.40 cm   LV e' medial:  8.81 cm/s  LVOT diam:   2.30 cm   LV E/e' medial: 18.8  LV SV:     76  LV SV Index:  35  LVOT Area:   4.15 cm    LV Volumes (MOD)  LV vol d, MOD A2C: 95.5 ml  LV vol d, MOD A4C: 108.0 ml  LV vol s, MOD A2C: 37.6 ml  LV vol s, MOD A4C: 49.1 ml  LV SV MOD A2C:   57.9 ml  LV SV MOD A4C:   108.0 ml  LV SV MOD BP:   59.1 ml   RIGHT VENTRICLE  RV Basal diam: 2.00 cm  RV S prime:   7.18 cm/s  TAPSE (M-mode): 1.5 cm   LEFT ATRIUM       Index    RIGHT ATRIUM      Index  LA diam:    4.80 cm 2.20 cm/m RA Area:   13.00 cm  LA Vol (A2C):  82.1 ml 37.65 ml/m RA Volume:  27.40 ml 12.56 ml/m  LA Vol (A4C):  62.3 ml 28.57 ml/m  LA Biplane Vol: 71.1 ml 32.60 ml/m  AORTIC VALVE  AV Area (Vmax):  1.10 cm  AV Area (Vmean):  1.06 cm  AV Area (VTI):   1.06 cm  AV Vmax:      305.50 cm/s  AV Vmean:     235.500 cm/s  AV VTI:      0.714 m  AV Peak Grad:   37.3 mmHg  AV Mean Grad:   26.0 mmHg  LVOT Vmax:     80.90 cm/s  LVOT Vmean:    60.200 cm/s  LVOT VTI:  0.182 m  LVOT/AV VTI ratio: 0.25    AORTA  Ao Root diam: 3.30 cm  Ao Asc diam: 4.10 cm   MITRAL VALVE         TRICUSPID VALVE  MV Area (PHT): 2.83 cm   TR Peak grad:  37.5 mmHg  MV Peak grad: 10.4 mmHg  TR Vmax:    306.00 cm/s  MV Mean grad: 5.0 mmHg  MV Vmax:    1.61 m/s   SHUNTS  MV Vmean:   110.0 cm/s  Systemic VTI: 0.18 m  MV Decel Time: 268 msec   Systemic Diam: 2.30 cm  MR Peak grad: 122.3 mmHg  MR Mean grad: 78.0 mmHg  MR Vmax:   553.00 cm/s  MR Vmean:   414.0 cm/s  MV E velocity: 166.00 cm/s  MV A velocity: 103.00 cm/s  MV E/A ratio: 1.61   __________________  [facility] 03/21/20 RIGHT AND LEFT HEART CATH possible percutaneous intervention  Conclusion    Ost Cx to Prox Cx lesion is 85% stenosed.  Mid Cx to Dist Cx lesion is 100% stenosed.  1st Mrg lesion is 70% stenosed.  Prox LAD to Mid LAD lesion is 90% stenosed.  Ost RCA to Prox RCA lesion is 99% stenosed.  SVG.  The graft exhibits mild diffuse disease.  Left radial artery graft was visualized by angiography.  Origin lesion is 100% stenosed.  LIMA graft was visualized by non-selective angiography and is normal in caliber.  The graft exhibits no disease.   1.  Significant underlying three-vessel coronary artery disease with patent LIMA to LAD and SVG to OM 3.  Radial graft to RCA is occluded at the ostium with critical stenosis of the ostial native right coronary artery.  This is the likely culprit for recent non-STEMI and current unstable angina. 2.  Severe aortic stenosis with peak gradient of 27.4 mmHg and valve area of 0.87. 3.  Right heart catheterization showed moderately to severely elevated filling pressures with pulmonary wedge pressure of 31 mmHg, moderate pulmonary hypertension at 50/30 mmHg and normal cardiac output at 5.06 with a cardiac index of 2.36.  Prominent V waves on wedge pressure tracing suggestive of significant mitral regurgitation.  Recommendations: This is an overall difficult case.  The patient will need to be evaluated for TAVR plus RCA PCI.  Advanced  chronic kidney disease and anemia in addition to significant heart failure make timing of this difficult.  The patient will likely need to be initiated on hemodialysis in order to improve his volume status.  He needs blood transfusion to keep his hemoglobin above 9.  Nonetheless, he might not be able to tolerate large volume shifts due to significant coronary artery disease and aortic stenosis. I will discuss with our structural heart team.  It might be best to transfer the patient to [facility]. Vascular access on this patient is very difficult due to extensive stenting and calcifications of the lower extremities.  The SFA stent on the right side might be extending into the common femoral artery.  Arterial access might be easier via the left femoral artery.    Laboratory Data:  Chemistry Recent Labs  Lab 03/20/20 0620 03/20/20 0919 03/22/20 0654  NA 138  --  134*  K 3.8  --  3.9  CL 105  --  99  CO2 20*  --  23  GLUCOSE 178*  --  210*  BUN 63*  --  82*  CREATININE 4.21* 4.58* 4.47*  CALCIUM 7.8*  --  8.5*  GFRNONAA 14* 12* 13*  GFRAA 16* 14* 15*  ANIONGAP 13  --  12    No results for input(s): PROT, ALBUMIN, AST, ALT, ALKPHOS, BILITOT in the last 168 hours. Hematology Recent Labs  Lab 03/20/20 0620 03/20/20 1358 03/21/20 0341 03/22/20 0654  WBC 6.6  --  5.9 8.7  RBC 2.49* 2.48* 2.42* 2.22*  HGB 8.0*  --  7.8* 7.1*  HCT 24.3*  --  23.9* 22.5*  MCV 97.6  --  98.8 101.4*  MCH 32.1  --  32.2 32.0  MCHC 32.9  --  32.6 31.6  RDW 14.3  --  14.1 14.3  PLT 125*  --  125* 135*   Cardiac EnzymesNo results for input(s): TROPONINI in the last 168 hours. No results for input(s): TROPIPOC in the last 168 hours.  BNP Recent Labs  Lab 03/20/20 0620  BNP 503.2*    DDimer No results for input(s): DDIMER in the last 168 hours.  Radiology/Studies:  CARDIAC CATHETERIZATION  Result Date: 03/21/2020  Colon Flattery Cx to Prox Cx lesion is 85% stenosed.  Mid Cx to Dist Cx lesion is  100% stenosed.  1st Mrg lesion is 70% stenosed.  Prox LAD to Mid LAD lesion is 90% stenosed.  Ost RCA to Prox RCA lesion is 99% stenosed.  SVG.  The graft exhibits mild diffuse disease.  Left radial artery graft was visualized by angiography.  Origin lesion is 100% stenosed.  LIMA graft was visualized by non-selective angiography and is normal in caliber.  The graft exhibits no disease.  1.  Significant underlying three-vessel coronary artery disease with patent LIMA to LAD and SVG to OM 3.  Radial graft to RCA is occluded at the ostium with critical stenosis of the ostial native right coronary artery.  This is the likely culprit for recent non-STEMI and current unstable angina. 2.  Severe aortic stenosis with peak gradient of 27.4 mmHg and valve area of 0.87. 3.  Right heart catheterization showed moderately to severely elevated filling pressures with pulmonary wedge pressure of 31 mmHg, moderate pulmonary hypertension at 50/30 mmHg and normal cardiac output at 5.06 with a cardiac index of 2.36.  Prominent V waves on wedge pressure tracing suggestive of significant mitral regurgitation. Recommendations: This is an overall difficult case.  The patient will need to be evaluated for TAVR plus RCA PCI.  Advanced chronic kidney disease and anemia in addition to significant heart failure make timing of this difficult.  The patient will likely need to be initiated on hemodialysis in order to improve his volume status.  He needs blood transfusion to keep his hemoglobin above 9.  Nonetheless, he might not be able to tolerate large volume shifts due to significant coronary artery disease and aortic stenosis. I will discuss with our structural heart team.  It might be best to transfer the patient to [facility]. Vascular access on this patient is very difficult due to extensive stenting and calcifications of the lower extremities.  The SFA stent on the right side might be extending into the common femoral  artery.  Arterial access might be easier via the left femoral artery.   DG Chest Portable 1 View  Result Date: 03/20/2020 CLINICAL DATA:  Chest pain. EXAM: PORTABLE CHEST 1 VIEW COMPARISON:  February 15, 2020 FINDINGS: No pneumothorax. Stable cardiomegaly. Diffuse interstitial opacities are identified. No nodules or masses. No focal infiltrates. IMPRESSION: Diffuse interstitial opacities suggestive of pulmonary edema given cardiomegaly. Atypical infection could have a similar appearance.  Recommend clinical correlation. Electronically Signed   By: Dorise Bullion III M.D   On: 03/20/2020 08:08   [employer]  [id number] 03/22/2020  1 a. Ability to shower/bathe Slightly limited  1 c. Ability to hurry/jog Other, Did not do  2. Edema feet/ankles/legs Every morning  3. Limited by fatigue All of the time  4. Limited by dyspnea All of the time  5. Sitting up / on 3+ pillows Every night  6. Limited enjoyment of life Extremely limited  7. Rest of life w/ symptoms Not at all satisfied  8 a. Participation in hobbies Severely limited  8 b. Participation in chores Severely limited  8 c. Visiting family/friends Severely limited    __________________________   STS Risk Calculator: Procedure: AVR + CAB Risk of Mortality: 16.884% Renal Failure: NA Permanent Stroke: 4.561% Prolonged Ventilation: 40.619% DSW Infection: 0.758% Reoperation: 9.186% Morbidity or Mortality: 81.984% Short Length of Stay: 5.608% Long Length of Stay: 33.588%   Assessment and Plan:   KELLI EGOLF is a 64 y.o. male with symptoms of severe, stage D2 aortic stenosis with NYHA Class IV symptoms curently admitted with NSTEMI and acute CHF.  I have reviewed the patient's recent echocardiogram which is notable for mildly reduced LV systolic function and severe aortic stenosis with peak gradient of  37 mm hg mean transvalvular gradient of 21 mmhg. The calculated aortic valve area is 1.06 cm. Also,  there is moderate to severe MR. (formal read is pending).   He underwent L/RHC on 03/21/20 which showed significant underlying three-vessel coronary artery disease with patent LIMA to LAD and SVG to OM 3.  The radial graft to RCA is occluded at the ostium with critical stenosis of the ostial native right coronary artery.  This was felt to be the culprit for his current unstable angina and recent NSTEMI.  There was also severe aortic stenosis with a peak gradient of 27.4 and valve area of 0.87.  Right heart catheterization showed moderately to severely elevated filling pressures with a pulmonary wedge pressure of 31 mmHg, moderate pulmonary hypertension at 50/30 mmHg and normal cardiac output at 5.06 with a cardiac index of 2.36. There were prominent V waves on wedge pressure tracing suggestive of significant mitral regurgitation.  Given advanced chronic kidney disease and worsening anemia as well as significant heart failure and severe aortic stenosis, it was decided to transfer him to [facility] for evaluation by the structural heart team.  Of note, the patient had very difficult vascular access due to extensive stenting and calcifications in the lower extremities.     I have reviewed the natural history of aortic stenosis with the patient. We have discussed the limitations of medical therapy and the poor prognosis associated with symptomatic aortic stenosis. We have reviewed potential treatment options, including palliative medical therapy, conventional surgical aortic valve replacement, and transcatheter aortic valve replacement. We discussed treatment options in the context of this patient's specific comorbid medical conditions.    The patient's predicted risk of mortality with conventional aortic valve replacement is 16.884% primarily based on age, CAD s/p previous CABG, morbid obesity, end-stage renal disease approaching hemodialysis, anemia, hypertension, cerebrovascular disease with previous CEA,,  significant peripheral vascular disease. Other significant comorbid conditions include chronic debilitation. TAVR seems like a reasonable treatment option for this patient pending formal cardiac surgical consultation. We discussed typical evaluation which will require a gated cardiac CTA and a CTA of the chest/abdomen/pelvis to evaluate both his cardiac anatomy and peripheral vasculature.  Follow-up testing will be arranged as an outpatient.  The patient will require IV diuresis.  He has been started on 180 mg of IV Lasix.  He is also being transfused for acute anemia on chronic with a hemoglobin down to 7.1.  If his hemoglobin improves, plan to proceed with coronary revascularization tomorrow with PCI to RCA.  The patient understands that there is a high risk for contrast associated nephropathy and he may require hemodialysis after heart catheterization.  He is okay with this and nephrology is following.  Plan to see how he does with diuresis and PCI. (oh note, he will require pre contrast for cath). Plan for staged TAVR down the road.  Dr. Angelena Form to follow.   Signed, Angelena Form, PA-C  03/22/2020 11:04 AM   I have personally seen and examined this patient. I agree with the assessment and plan as outlined above. Mr. Durflinger is a 64 yo male with history of CAD s/p CABG in 2012, carotid artery disease, extensive PAD, DM, HTN, HLD, tobacco abuse, obesity, CKD stage 4, anemia of chronic disease and severe aortic stenosis who we are asked to see today to discuss possible TAVR. Echo June 2021 with LVEF=50-55%, mild to moderate MR, moderate to severe AS with mean gradient 26 mmHg, peak gradient 37 mmHg, AVA 1.06 cm2. His CAD has been stable until recently. He was admitted to [facility] 02/13/20 with chest pain and found to have a NSTEMI. Cardiac cath delayed due to CKD and acute respiratory failure from volume overload (secondary to CKD and diastolic CHF). He insisted on going home and was discharged after  diuresis with IV Lasix. He was seen in our office at [facility] in [place] on 03/11/20 with accelerated angina. He was ultimately admitted to [facility] several days ago with worsened chest pain and mild troponin elevation. Cardiac cath 03/21/20 with severe disease in the ostium of the RCA with occluded vein graft to the RCA. Patent grafts to the left coronary arteries. No PCI performed given his renal insufficiency. Elevated PCWP at 31 mmHg. He has been found to have worsening anemia with Hgb 7.1 today. He was transferred to [facility] for further workup and consideration of PCI of the RCA and planning for potential TAVR. He has been followed closely by the Nephrology team at [facility] and is being seen today by our Nephrology team at [facility]. Dialysis is being considered but not felt to be indicated at this time.   He tells me today that he is having no chest pain. He feels tired. No dyspnea at rest.   Labs reviewed by me.  EKG reviewed by me and shows NSR, LBBB  Echo images reviewed by me. His aortic valve leaflets are thickened and calcified with limited mobility. Mean gradient 26 mmHg, peak gradient 37 mmHg, AVA 1.06 cm2, dimensionless index 0.25.  Cardiac cath images personally reviewed by me.   Exam:  General: Well developed, well nourished, NAD  HEENT: OP clear, mucus membranes moist  SKIN: warm, dry. No rashes.  Neuro: No focal deficits  Musculoskeletal: Muscle strength 5/5 all ext  Psychiatric: Mood and affect normal  Neck: + JVD  Lungs:Clear bilaterally, no wheezes, rhonci, crackles  Cardiovascular: Regular rate and rhythm. Loud, harsh systolic murmur.  Abdomen:Soft. Bowel sounds present. Non-tender.  Extremities: No lower extremity edema.   Plan:  1. Severe paradoxical low flow/low gradient aortic stenosis: I have personally reviewed the echo images. The aortic valve is thickened, calcified with limited leaflet mobility. I think he would  benefit from AVR. Given his many comorbidites including  CKD, prior open chest surgery, anemia and advanced CAD, he is not felt to be a good candidate for traditional surgical AVR. He may be a candidate for TAVR. He has severe PAD with prior interventions and femoral artery access will be difficult. We would consider alternative access for TAVR.   I have reviewed the natural history of aortic stenosis with the patient. We have discussed the limitations of medical therapy and the poor prognosis associated with symptomatic aortic stenosis. We have reviewed potential treatment options, including palliative medical therapy, conventional surgical aortic valve replacement, and transcatheter aortic valve replacement. We discussed treatment options in the context of the patient's specific comorbid medical conditions. Risks and benefits of the TAVR procedure reviewed with the patient.   He would like to proceed with planning for TAVR. He will first require PCI of the RCA. Nephrology has suggested that we may proceed with PCI at this time. He will need aggressive diuresis today. Will transfuse pRBCs. Will reassess candidacy for cardiac cath/PCI in the am.   After the cath, he will have a cardiac CT, CTA of the chest/abdomen and pelvis, carotid dopplers and we will then have one of the CT surgeons on our TAVR team see him. It would be optimal to establish HD access prior to TAVR if possible.    Lauree Chandler   03/22/2020 3:55 PM

## 2020-03-22 NOTE — Progress Notes (Addendum)
Patient refuses alarm, patient educated on safety and when /how to call for help.

## 2020-03-22 NOTE — Progress Notes (Signed)
Pt got his home unit cpap and he said he will wear it by himself.

## 2020-03-23 ENCOUNTER — Ambulatory Visit: Admission: RE | Admit: 2020-03-23 | Payer: Medicare Other | Source: Home / Self Care | Admitting: Vascular Surgery

## 2020-03-23 ENCOUNTER — Encounter: Admission: RE | Payer: Self-pay | Source: Home / Self Care

## 2020-03-23 ENCOUNTER — Encounter (HOSPITAL_COMMUNITY)
Admission: AD | Disposition: A | Discharge status: Home / Self Care | Payer: Self-pay | Source: Other Acute Inpatient Hospital | Attending: Cardiovascular Disease

## 2020-03-23 ENCOUNTER — Other Ambulatory Visit: Payer: Self-pay

## 2020-03-23 DIAGNOSIS — Z86010 Personal history of colonic polyps: Secondary | ICD-10-CM

## 2020-03-23 DIAGNOSIS — R195 Other fecal abnormalities: Secondary | ICD-10-CM

## 2020-03-23 DIAGNOSIS — I214 Non-ST elevation (NSTEMI) myocardial infarction: Secondary | ICD-10-CM

## 2020-03-23 DIAGNOSIS — D509 Iron deficiency anemia, unspecified: Secondary | ICD-10-CM

## 2020-03-23 HISTORY — PX: CORONARY STENT INTERVENTION: CATH118234

## 2020-03-23 LAB — BASIC METABOLIC PANEL
Anion gap: 13 (ref 5–15)
BUN: 85 mg/dL — ABNORMAL HIGH (ref 8–23)
CO2: 23 mmol/L (ref 22–32)
Calcium: 8.6 mg/dL — ABNORMAL LOW (ref 8.9–10.3)
Chloride: 97 mmol/L — ABNORMAL LOW (ref 98–111)
Creatinine, Ser: 4.68 mg/dL — ABNORMAL HIGH (ref 0.61–1.24)
GFR calc Af Amer: 14 mL/min — ABNORMAL LOW (ref >60)
GFR calc non Af Amer: 12 mL/min — ABNORMAL LOW (ref >60)
Glucose, Bld: 218 mg/dL — ABNORMAL HIGH (ref 70–99)
Potassium: 4.8 mmol/L (ref 3.5–5.1)
Sodium: 133 mmol/L — ABNORMAL LOW (ref 135–145)

## 2020-03-23 LAB — HEMOGLOBIN AND HEMATOCRIT, BLOOD
HCT: 25.1 % — ABNORMAL LOW (ref 39.0–52.0)
Hemoglobin: 7.9 g/dL — ABNORMAL LOW (ref 13.0–17.0)

## 2020-03-23 LAB — CBC
HCT: 25.1 % — ABNORMAL LOW (ref 39.0–52.0)
Hemoglobin: 7.9 g/dL — ABNORMAL LOW (ref 13.0–17.0)
MCH: 30.7 pg (ref 26.0–34.0)
MCHC: 31.5 g/dL (ref 30.0–36.0)
MCV: 97.7 fL (ref 80.0–100.0)
Platelets: 128 10*3/uL — ABNORMAL LOW (ref 150–400)
RBC: 2.57 MIL/uL — ABNORMAL LOW (ref 4.22–5.81)
RDW: 16.2 % — ABNORMAL HIGH (ref 11.5–15.5)
WBC: 8.3 10*3/uL (ref 4.0–10.5)
nRBC: 0 % (ref 0.0–0.2)

## 2020-03-23 LAB — GLUCOSE, CAPILLARY
Glucose-Capillary: 211 mg/dL — ABNORMAL HIGH (ref 70–99)
Glucose-Capillary: 210 mg/dL — ABNORMAL HIGH (ref 70–99)
Glucose-Capillary: 301 mg/dL — ABNORMAL HIGH (ref 70–99)

## 2020-03-23 LAB — POCT ACTIVATED CLOTTING TIME: Activated Clotting Time: 290 seconds

## 2020-03-23 LAB — PREPARE RBC (CROSSMATCH)

## 2020-03-23 LAB — HEPARIN LEVEL (UNFRACTIONATED): Heparin Unfractionated: 0.43 IU/mL (ref 0.30–0.70)

## 2020-03-23 SURGERY — CORONARY STENT INTERVENTION
Anesthesia: LOCAL

## 2020-03-23 SURGERY — DIALYSIS/PERMA CATHETER INSERTION
Anesthesia: General

## 2020-03-23 MED ORDER — FENTANYL CITRATE (PF) 100 MCG/2ML IJ SOLN
INTRAMUSCULAR | Status: DC | PRN
Start: 2020-03-23 — End: 2020-03-23
  Administered 2020-03-23 (×2): 50 ug via INTRAVENOUS

## 2020-03-23 MED ORDER — SODIUM CHLORIDE 0.9% FLUSH: 3.0000 mL | INTRAVENOUS | Status: DC | PRN

## 2020-03-23 MED ORDER — HEPARIN SODIUM (PORCINE) 1000 UNIT/ML IJ SOLN
INTRAMUSCULAR | Status: DC | PRN
  Administered 2020-03-23: 8000 [IU] via INTRAVENOUS

## 2020-03-23 MED ORDER — TORSEMIDE 20 MG PO TABS
40.0000 mg | ORAL_TABLET | Freq: Two times a day (BID) | ORAL | Status: DC
  Administered 2020-03-23: 40 mg via ORAL
  Filled 2020-03-23: qty 2

## 2020-03-23 MED ORDER — MIDAZOLAM HCL 2 MG/2ML IJ SOLN
INTRAMUSCULAR | Status: DC | PRN
  Administered 2020-03-23: 1 mg via INTRAVENOUS

## 2020-03-23 MED ORDER — LIDOCAINE HCL (PF) 1 % IJ SOLN
INTRAMUSCULAR | Status: DC | PRN
  Administered 2020-03-23: 3 mL

## 2020-03-23 MED ORDER — NITROGLYCERIN 1 MG/10 ML FOR IR/CATH LAB
INTRA_ARTERIAL | Status: AC
  Filled 2020-03-23: qty 10

## 2020-03-23 MED ORDER — HEPARIN (PORCINE) IN NACL 1000-0.9 UT/500ML-% IV SOLN
INTRAVENOUS | Status: DC | PRN
  Administered 2020-03-23: 500 mL

## 2020-03-23 MED ORDER — VERAPAMIL HCL 2.5 MG/ML IV SOLN
INTRAVENOUS | Status: DC | PRN
  Administered 2020-03-23: 10 mL via INTRA_ARTERIAL

## 2020-03-23 MED ORDER — VERAPAMIL HCL 2.5 MG/ML IV SOLN
INTRAVENOUS | Status: AC
  Filled 2020-03-23: qty 2

## 2020-03-23 MED ORDER — SODIUM CHLORIDE 0.9% FLUSH
3.0000 mL | Freq: Two times a day (BID) | INTRAVENOUS | Status: DC
  Administered 2020-03-23 – 2020-03-25 (×2): 3 mL via INTRAVENOUS

## 2020-03-23 MED ORDER — FENTANYL CITRATE (PF) 100 MCG/2ML IJ SOLN
INTRAMUSCULAR | Status: AC
  Filled 2020-03-23: qty 2

## 2020-03-23 MED ORDER — SODIUM CHLORIDE 0.9 % IV SOLN: 250.0000 mL | INTRAVENOUS | Status: DC | PRN

## 2020-03-23 MED ORDER — INSULIN ASPART 100 UNIT/ML ~~LOC~~ SOLN
0.0000 [IU] | SUBCUTANEOUS | Status: DC
  Administered 2020-03-23: 7 [IU] via SUBCUTANEOUS
  Administered 2020-03-23: 3 [IU] via SUBCUTANEOUS
  Administered 2020-03-24 (×3): 2 [IU] via SUBCUTANEOUS

## 2020-03-23 MED ORDER — SODIUM CHLORIDE 0.9% IV SOLUTION: Freq: Once | INTRAVENOUS | Status: AC

## 2020-03-23 MED ORDER — TORSEMIDE 20 MG PO TABS
40.0000 mg | ORAL_TABLET | Freq: Every day | ORAL | Status: DC
  Filled 2020-03-23: qty 2

## 2020-03-23 MED ORDER — TORSEMIDE 20 MG PO TABS: 40.0000 mg | ORAL_TABLET | Freq: Every day | ORAL | Status: DC

## 2020-03-23 MED ORDER — HEPARIN (PORCINE) IN NACL 1000-0.9 UT/500ML-% IV SOLN
INTRAVENOUS | Status: AC
  Filled 2020-03-23: qty 1000

## 2020-03-23 MED ORDER — HEPARIN SODIUM (PORCINE) 1000 UNIT/ML IJ SOLN
INTRAMUSCULAR | Status: AC
  Filled 2020-03-23: qty 1

## 2020-03-23 MED ORDER — ROSUVASTATIN CALCIUM 20 MG PO TABS
10.0000 mg | ORAL_TABLET | Freq: Every day | ORAL | Status: DC
  Administered 2020-03-24 – 2020-03-25 (×2): 10 mg via ORAL
  Filled 2020-03-23 (×2): qty 1

## 2020-03-23 MED ORDER — IOHEXOL 350 MG/ML SOLN
INTRAVENOUS | Status: DC | PRN
  Administered 2020-03-23: 60 mL

## 2020-03-23 MED ORDER — MIDAZOLAM HCL 2 MG/2ML IJ SOLN
INTRAMUSCULAR | Status: AC
  Filled 2020-03-23: qty 2

## 2020-03-23 MED ORDER — HYDRALAZINE HCL 25 MG PO TABS
25.0000 mg | ORAL_TABLET | Freq: Three times a day (TID) | ORAL | Status: DC
  Administered 2020-03-23 – 2020-03-25 (×6): 25 mg via ORAL
  Filled 2020-03-23 (×7): qty 1

## 2020-03-23 MED ORDER — LIDOCAINE HCL (PF) 1 % IJ SOLN
INTRAMUSCULAR | Status: AC
  Filled 2020-03-23: qty 30

## 2020-03-23 SURGICAL SUPPLY — 17 items
BALLN EMERGE MR 2.75X12 (BALLOONS) ×2
BALLOON EMERGE MR 2.75X12 (BALLOONS) IMPLANT
CATH LAUNCHER 6FR JR4 (CATHETERS) ×1 IMPLANT
DEVICE RAD COMP TR BAND LRG (VASCULAR PRODUCTS) ×1 IMPLANT
ELECT DEFIB PAD ADLT CADENCE (PAD) ×1 IMPLANT
GLIDESHEATH SLEND SS 6F .021 (SHEATH) ×1 IMPLANT
GUIDEWIRE INQWIRE 1.5J.035X260 (WIRE) IMPLANT
INQWIRE 1.5J .035X260CM (WIRE) ×2
KIT ENCORE 26 ADVANTAGE (KITS) ×1 IMPLANT
KIT HEART LEFT (KITS) ×2 IMPLANT
PACK CARDIAC CATHETERIZATION (CUSTOM PROCEDURE TRAY) ×2 IMPLANT
SHEATH PINNACLE 6F 10CM (SHEATH) ×1 IMPLANT
STENT RESOLUTE ONYX 4.0X18 (Permanent Stent) ×1 IMPLANT
TRANSDUCER W/STOPCOCK (MISCELLANEOUS) ×2 IMPLANT
TUBING CIL FLEX 10 FLL-RA (TUBING) ×2 IMPLANT
WIRE EMERALD 3MM-J .035X150CM (WIRE) ×1 IMPLANT
WIRE RUNTHROUGH .014X180CM (WIRE) ×1 IMPLANT

## 2020-03-23 NOTE — Interval H&P Note (Signed)
Cath Lab Visit (complete for each Cath Lab visit)  Clinical Evaluation Leading to the Procedure:   ACS: Yes.    Non-ACS:  n/a   History and Physical Interval Note:  03/23/2020 1:51 PM  [name] [name]  has presented today for surgery, with the diagnosis of cad.  The various methods of treatment have been discussed with the patient and family. After consideration of risks, benefits and other options for treatment, the patient has consented to  Procedure(s): CORONARY STENT INTERVENTION (N/A) as a surgical intervention.  The patient's history has been reviewed, patient examined, no change in status, stable for surgery.  I have reviewed the patient's chart and labs.  Questions were answered to the patient's satisfaction.     [name]

## 2020-03-23 NOTE — Consult Note (Addendum)
[facility] for heparin  Indication: chest pain/ACS  Allergies  Allergen Reactions  . Contrast Media [Iodinated Diagnostic Agents] Rash and Other (See Comments)    Got very hot and red   . Glipizide Other (See Comments)    ANTIDIABETICS. Burning  . Hydroxychloroquine Other (See Comments)    Stomach upset  . Metrizamide Other (See Comments)    Got very hot and red  . Penicillins Hives and Swelling    Has patient had a PCN reaction causing immediate rash, facial/tongue/throat swelling, SOB or lightheadedness with hypotension: yes Has patient had a PCN reaction causing severe rash involving mucus membranes or skin necrosis: no  Has patient had a PCN reaction that required hospitalization: yes Has patient had a PCN reaction occurring within the last 10 years: no If all of the above answers are "NO", then may proceed with Cephalosporin use.     Patient Measurements: Height: 5\' 6"  (167.6 cm) Weight: 106.7 kg (235 lb 3.2 oz) IBW/kg (Calculated) : 63.8 Heparin Dosing Weight: 86.4  Vital Signs: Temp: 97.5 F (36.4 C) (09/01 0800) Temp Source: Oral (09/01 0800) BP: 132/70 (09/01 0800) Pulse Rate: 91 (09/01 0800)  Labs: Recent Labs    03/20/20 1255 03/20/20 1752 03/21/20 0341 03/21/20 0341 03/22/20 0654 03/22/20 0654 03/22/20 1815 03/22/20 1815 03/23/20 0524 03/23/20 0728  HGB  --   --  7.8*   < > 7.1*   < > 8.5*   < > 7.9* 7.9*  HCT  --   --  23.9*   < > 22.5*   < > 27.0*  --  25.1* 25.1*  PLT  --   --  125*  --  135*  --   --   --  128*  --   HEPARINUNFRC  --    < > 0.55  --  0.59  --   --   --  0.43  --   CREATININE  --   --   --   --  4.47*  --   --   --  4.68*  --   TROPONINIHS 86*  --   --   --   --   --   --   --   --   --    < > = values in this interval not displayed.    Estimated Creatinine Clearance: 17.8 mL/min (A) (by C-G formula based on SCr of 4.68 mg/dL (H)).   Assessment: 64 year old male with CAD s/p cath,  awaiting PCI 03/23/20.  Pharmacy consulted to dose/monitor IV heparin.  Heparin restarted after cath at ~2030 at [facility].  Not on anticoagulation prior to [facility] admission.   Heparin level = 0.43  remains therapeutic on heparin infusiuon 1450 units/hr.  Anemia likely related to renal disease.   Hgb 7.1>8.5>7.9, pltc [phone].  No bleeding reported.  PCI scheduled for today 03/23/20.   Goal of Therapy:  Heparin level 0.3-0.7 units/ml Monitor platelets by anticoagulation protocol: Yes   Plan:  Continue Heparin 1450 units/hr Daily heparin level and CBC Follow up for anticoagulation plan post  PCI.   Nicole Cella, RPh Clinical Pharmacist Please check AMION for all [facility] phone numbers After 10:00 PM, call [place] [phone]  9:36 AM 03/23/2020   Received 1 unit packed red blood cells on 8/31. Hgb 8.1 post transfusion.  Hemoglobin has drifted down again this AM down to 7.9. MD noted no active signs of bleeding.  GI consulted for anemia.  Heparin continues per cardiology. Plan for PCI.  Nicole Cella, RPh Clinical Pharmacist 03/23/2020 11:00 AM

## 2020-03-23 NOTE — Progress Notes (Signed)
Pt was noted by another nurse walking through holding to have unhooked himself from the monitor and walking around. Pt placed back on the monitor and given more snacks to eat. States he cannot eat the processed [place] sandwich.

## 2020-03-23 NOTE — H&P (View-Only) (Signed)
Cardiology Progress Note  Patient ID: [name] [name] MRN: [id number] DOB: 06-22-1956 Date of Encounter: 03/23/2020  Primary Cardiologist: [name], MD  Subjective   Chief Complaint: None.  HPI: Received 1 unit packed red blood cells yesterday.  Hemoglobin has drifted down again.  No active signs of bleeding.  No chest pain reported.  Remains on heparin.  Plan for PCI today.  ROS:  All other ROS reviewed and negative. Pertinent positives noted in the HPI.     Inpatient Medications  Scheduled Meds: . sodium chloride   Intravenous Once  . amLODipine  2.5 mg Oral Daily  . aspirin EC  81 mg Oral Daily  . busPIRone  20 mg Oral BID  . calcitRIOL  0.25 mcg Oral Q M,W,F  . calcium carbonate  250 mg Oral TID  . carvedilol  12.5 mg Oral Daily  . clopidogrel  75 mg Oral Daily  . diphenhydrAMINE  50 mg Oral Once  . finasteride  5 mg Oral Daily  . hydrALAZINE  10 mg Oral Q8H  . insulin aspart  0-9 Units Subcutaneous Q4H  . isosorbide mononitrate  90 mg Oral Daily  . predniSONE  50 mg Oral Q6H  . sertraline  100 mg Oral QHS  . sodium chloride flush  3 mL Intravenous Q12H  . sodium chloride flush  3 mL Intravenous Q12H  . tamsulosin  0.4 mg Oral Daily  . torsemide  40 mg Oral Daily   Continuous Infusions: . sodium chloride    . sodium chloride    . sodium chloride 10 mL/hr at 03/23/20 0643  . heparin 1,450 Units/hr (03/23/20 0336)  . nitroGLYCERIN 30 mcg/min (03/23/20 0021)   PRN Meds: sodium chloride, sodium chloride, acetaminophen, albuterol, alum & mag hydroxide-simeth, cyclobenzaprine, hydroxypropyl methylcellulose / hypromellose, nitroGLYCERIN, ondansetron (ZOFRAN) IV, sodium chloride flush, sodium chloride flush   Vital Signs   Vitals:   03/23/20 0015 03/23/20 0303 03/23/20 0636 03/23/20 0800  BP:  127/73 131/76 132/70  Pulse:  100  91  Resp:  18  18  Temp:  98 F (36.7 C)  (!) 97.5 F (36.4 C)  TempSrc:  Oral  Oral  SpO2:  94%  100%  Weight: 106.7 kg       Height:        Intake/Output Summary (Last 24 hours) at 03/23/2020 1000 Last data filed at 03/23/2020 0113 Gross per 24 hour  Intake 1726.09 ml  Output 1450 ml  Net 276.09 ml   Last 3 Weights 03/23/2020 03/22/2020 03/21/2020  Weight (lbs) 235 lb 3.2 oz 233 lb 14.4 oz 234 lb 3.2 oz  Weight (kg) 106.686 kg 106.096 kg 106.232 kg  Some encounter information is confidential and restricted. Go to Review Flowsheets activity to see all data.      Telemetry  Overnight telemetry shows sinus rhythm with heart rate in the 90s, which I personally reviewed.   ECG  The most recent ECG shows normal sinus rhythm, heart rate 95, left bundle branch block, which I personally reviewed.   Physical Exam   Vitals:   03/23/20 0015 03/23/20 0303 03/23/20 0636 03/23/20 0800  BP:  127/73 131/76 132/70  Pulse:  100  91  Resp:  18  18  Temp:  98 F (36.7 C)  (!) 97.5 F (36.4 C)  TempSrc:  Oral  Oral  SpO2:  94%  100%  Weight: 106.7 kg     Height:         Intake/Output Summary (Last 24  hours) at 03/23/2020 1000 Last data filed at 03/23/2020 0113 Gross per 24 hour  Intake 1726.09 ml  Output 1450 ml  Net 276.09 ml    Last 3 Weights 03/23/2020 03/22/2020 03/21/2020  Weight (lbs) 235 lb 3.2 oz 233 lb 14.4 oz 234 lb 3.2 oz  Weight (kg) 106.686 kg 106.096 kg 106.232 kg  Some encounter information is confidential and restricted. Go to Review Flowsheets activity to see all data.    Body mass index is 37.96 kg/m.  General: Well nourished, well developed, in no acute distress Head: Atraumatic, normal size  Eyes: PEERLA, EOMI  Neck: Supple, +JVD Endocrine: No thryomegaly Cardiac: Normal S1, S2; RRR; 3 out of 6 systolic ejection murmur, late peaking Lungs: Rales present bilaterally Abd: Soft, nontender, no hepatomegaly  Ext: Trace edema, right femoral cath site clean and dry, right radial cath site clean and dry Musculoskeletal: No deformities, BUE and BLE strength normal and equal Skin: Warm and dry, no rashes    Neuro: Alert and oriented to person, place, time, and situation, CNII-XII grossly intact, no focal deficits  Psych: Normal mood and affect   Labs  High Sensitivity Troponin:   Recent Labs  Lab 03/11/20 1134 03/20/20 0620 03/20/20 0827 03/20/20 1255  TROPONINIHS 15 19* 51* 86*     Cardiac EnzymesNo results for input(s): TROPONINI in the last 168 hours. No results for input(s): TROPIPOC in the last 168 hours.  Chemistry Recent Labs  Lab 03/20/20 226 820 2530 03/20/20 0620 03/20/20 0919 03/22/20 0654 03/23/20 0524  NA 138  --   --  134* 133*  K 3.8  --   --  3.9 4.8  CL 105  --   --  99 97*  CO2 20*  --   --  23 23  GLUCOSE 178*  --   --  210* 218*  BUN 63*  --   --  82* 85*  CREATININE 4.21*   < > 4.58* 4.47* 4.68*  CALCIUM 7.8*  --   --  8.5* 8.6*  GFRNONAA 14*   < > 12* 13* 12*  GFRAA 16*   < > 14* 15* 14*  ANIONGAP 13  --   --  12 13   < > = values in this interval not displayed.    Hematology Recent Labs  Lab 03/21/20 0341 03/21/20 0341 03/22/20 0654 03/22/20 0654 03/22/20 1815 03/23/20 0524 03/23/20 0728  WBC 5.9  --  8.7  --   --  8.3  --   RBC 2.42*  --  2.22*  --   --  2.57*  --   HGB 7.8*   < > 7.1*   < > 8.5* 7.9* 7.9*  HCT 23.9*   < > 22.5*   < > 27.0* 25.1* 25.1*  MCV 98.8  --  101.4*  --   --  97.7  --   MCH 32.2  --  32.0  --   --  30.7  --   MCHC 32.6  --  31.6  --   --  31.5  --   RDW 14.1  --  14.3  --   --  16.2*  --   PLT 125*  --  135*  --   --  128*  --    < > = values in this interval not displayed.   BNP Recent Labs  Lab 03/20/20 0620  BNP 503.2*    DDimer No results for input(s): DDIMER in the last 168 hours.   Radiology  ECHOCARDIOGRAM COMPLETE  Result Date: 03/22/2020    ECHOCARDIOGRAM REPORT   Patient Name:   [name] [name] Date of Exam: 03/22/2020 Medical Rec #:  [id number]        Height:       66.0 in Accession #:    [id number]       Weight:       233.9 lb Date of Birth:  December 30, 1955         BSA:          2.137 m Patient Age:     64 years         BP:           113/70 mmHg Patient Gender: M                HR:           79 bpm. Exam Location:  Inpatient Procedure: 2D Echo, Cardiac Doppler and Color Doppler Indications:    Chest Pain 786.50 / R07.9  History:        Patient has prior history of Echocardiogram examinations, most                 recent 01/14/2020. CAD, Stroke; Risk Factors:Hypertension,                 Dyslipidemia, Diabetes and GERD.  Sonographer:    Bernadene Person RDCS Referring Phys: 58 Burns  1. Left ventricular ejection fraction, by estimation, is 30-35%. The left ventricle has moderately decreased function. The left ventricle demonstrates regional wall motion abnormalities, basal to mid inferior and inferoseptal akinesis, basal to mid inferolateral severe hypokinesis. The left ventricular internal cavity size was mildly dilated. There is mild left ventricular hypertrophy. Left ventricular diastolic parameters are consistent with Grade II diastolic dysfunction (pseudonormalization).  2. Right ventricular systolic function is moderately reduced. The right ventricular size is normal. There is mildly elevated pulmonary artery systolic pressure. The estimated right ventricular systolic pressure is 62.9 mmHg.  3. Left atrial size was moderately dilated.  4. The mitral valve is degenerative. Moderate to severe mitral valve regurgitation. No evidence of mitral stenosis.  5. The aortic valve is tricuspid. Aortic valve regurgitation is mild. Moderate aortic valve stenosis. Aortic valve area, by VTI measures 1.08 cm. Aortic valve mean gradient measures 22.0 mmHg.  6. Aortic dilatation noted. There is mild dilatation of the ascending aorta measuring 42 mm.  7. The inferior vena cava is dilated in size with >50% respiratory variability, suggesting right atrial pressure of 8 mmHg. FINDINGS  Left Ventricle: Left ventricular ejection fraction, by estimation, is 30 to 35%. The left ventricle has moderately decreased  function. The left ventricle demonstrates regional wall motion abnormalities. The left ventricular internal cavity size was mildly dilated. There is mild left ventricular hypertrophy. Left ventricular diastolic parameters are consistent with Grade II diastolic dysfunction (pseudonormalization). Right Ventricle: The right ventricular size is normal. No increase in right ventricular wall thickness. Right ventricular systolic function is moderately reduced. There is mildly elevated pulmonary artery systolic pressure. The tricuspid regurgitant velocity is 2.71 m/s, and with an assumed right atrial pressure of 8 mmHg, the estimated right ventricular systolic pressure is 52.8 mmHg. Left Atrium: Left atrial size was moderately dilated. Right Atrium: Right atrial size was normal in size. Pericardium: There is no evidence of pericardial effusion. Mitral Valve: The mitral valve is degenerative in appearance. There is moderate calcification of the mitral valve leaflet(s). Mild to moderate mitral annular calcification. Moderate  to severe mitral valve regurgitation. No evidence of mitral valve stenosis. Tricuspid Valve: The tricuspid valve is normal in structure. Tricuspid valve regurgitation is mild. Aortic Valve: The aortic valve is tricuspid. Aortic valve regurgitation is mild. Moderate aortic stenosis is present. Aortic valve mean gradient measures 22.0 mmHg. Aortic valve peak gradient measures 34.9 mmHg. Aortic valve area, by VTI measures 1.08 cm. Pulmonic Valve: The pulmonic valve was normal in structure. Pulmonic valve regurgitation is not visualized. Aorta: The aortic root is normal in size and structure and aortic dilatation noted. There is mild dilatation of the ascending aorta measuring 42 mm. Venous: The inferior vena cava is dilated in size with greater than 50% respiratory variability, suggesting right atrial pressure of 8 mmHg. IAS/Shunts: No atrial level shunt detected by color flow Doppler.  LEFT VENTRICLE PLAX  2D LVIDd:         6.50 cm      Diastology LVIDs:         6.20 cm      LV e' lateral:   6.53 cm/s LV PW:         1.00 cm      LV E/e' lateral: 25.1 LV IVS:        0.90 cm      LV e' medial:    3.16 cm/s LVOT diam:     2.20 cm      LV E/e' medial:  51.9 LV SV:         76 LV SV Index:   35 LVOT Area:     3.80 cm  LV Volumes (MOD) LV vol d, MOD A2C: 114.0 ml LV vol d, MOD A4C: 134.0 ml LV vol s, MOD A2C: 66.5 ml LV vol s, MOD A4C: 110.0 ml LV SV MOD A2C:     47.5 ml LV SV MOD A4C:     134.0 ml LV SV MOD BP:      35.4 ml RIGHT VENTRICLE RV S prime:     6.66 cm/s TAPSE (M-mode): 1.3 cm LEFT ATRIUM             Index       RIGHT ATRIUM           Index LA diam:        5.70 cm 2.67 cm/m  RA Area:     13.40 cm LA Vol (A2C):   60.5 ml 28.31 ml/m RA Volume:   29.50 ml  13.80 ml/m LA Vol (A4C):   91.3 ml 42.72 ml/m LA Biplane Vol: 75.5 ml 35.32 ml/m  AORTIC VALVE AV Area (Vmax):    1.15 cm AV Area (Vmean):   1.19 cm AV Area (VTI):     1.08 cm AV Vmax:           295.33 cm/s AV Vmean:          217.333 cm/s AV VTI:            0.699 m AV Peak Grad:      34.9 mmHg AV Mean Grad:      22.0 mmHg LVOT Vmax:         89.20 cm/s LVOT Vmean:        67.800 cm/s LVOT VTI:          0.199 m LVOT/AV VTI ratio: 0.28  AORTA Ao Root diam: 3.60 cm Ao Asc diam:  4.20 cm MITRAL VALVE                 TRICUSPID VALVE  MV Area (PHT): 2.82 cm      TR Peak grad:   29.4 mmHg MV Decel Time: 269 msec      TR Vmax:        271.00 cm/s MR Peak grad:    114.9 mmHg MR Mean grad:    69.0 mmHg   SHUNTS MR Vmax:         536.00 cm/s Systemic VTI:  0.20 m MR Vmean:        391.0 cm/s  Systemic Diam: 2.20 cm MR PISA:         0.57 cm MR PISA Eff ROA: 4 mm MR PISA Radius:  0.30 cm MV E velocity: 164.00 cm/s MV A velocity: 103.00 cm/s MV E/A ratio:  1.59 Loralie Champagne MD Electronically signed by Loralie Champagne MD Signature Date/Time: 03/22/2020/4:19:53 PM    Final     Cardiac Studies  LHC 03/21/2020  Ost Cx to Prox Cx lesion is 85% stenosed.  Mid Cx to Dist  Cx lesion is 100% stenosed.  1st Mrg lesion is 70% stenosed.  Prox LAD to Mid LAD lesion is 90% stenosed.  Ost RCA to Prox RCA lesion is 99% stenosed.  SVG.  The graft exhibits mild diffuse disease.  Left radial artery graft was visualized by angiography.  Origin lesion is 100% stenosed.  LIMA graft was visualized by non-selective angiography and is normal in caliber.  The graft exhibits no disease.   1.  Significant underlying three-vessel coronary artery disease with patent LIMA to LAD and SVG to OM 3.  Radial graft to RCA is occluded at the ostium with critical stenosis of the ostial native right coronary artery.  This is the likely culprit for recent non-STEMI and current unstable angina. 2.  Severe aortic stenosis with peak gradient of 27.4 mmHg and valve area of 0.87. 3.  Right heart catheterization showed moderately to severely elevated filling pressures with pulmonary wedge pressure of 31 mmHg, moderate pulmonary hypertension at 50/30 mmHg and normal cardiac output at 5.06 with a cardiac index of 2.36.  Prominent V waves on wedge pressure tracing suggestive of significant mitral regurgitation.  TTE 03/22/2020 1. Left ventricular ejection fraction, by estimation, is 30-35%. The left  ventricle has moderately decreased function. The left ventricle  demonstrates regional wall motion abnormalities, basal to mid inferior and  inferoseptal akinesis, basal to mid  inferolateral severe hypokinesis. The left ventricular internal cavity  size was mildly dilated. There is mild left ventricular hypertrophy. Left  ventricular diastolic parameters are consistent with Grade II diastolic  dysfunction (pseudonormalization).  2. Right ventricular systolic function is moderately reduced. The right  ventricular size is normal. There is mildly elevated pulmonary artery  systolic pressure. The estimated right ventricular systolic pressure is  40.9 mmHg.  3. Left atrial size was moderately  dilated.  4. The mitral valve is degenerative. Moderate to severe mitral valve  regurgitation. No evidence of mitral stenosis.  5. The aortic valve is tricuspid. Aortic valve regurgitation is mild.  Moderate aortic valve stenosis. Aortic valve area, by VTI measures 1.08  cm. Aortic valve mean gradient measures 22.0 mmHg.  6. Aortic dilatation noted. There is mild dilatation of the ascending  aorta measuring 42 mm.  7. The inferior vena cava is dilated in size with >50% respiratory  variability, suggesting right atrial pressure of 8 mmHg.    Patient Profile  [name] [name] is a 64 y.o. male with CAD status post CABG, CKD 4, chronic anemia, diabetes, morbid obesity,  OSA, PAD, tobacco abuse who was admitted to [facility] on 03/20/2020 with non-STEMI.  He was found to have a very tight native 99% lesion in the ostial RCA.  His bypass graft is down to that vessel.  He was then transferred to [facility] for PCI to the RCA as well as work-up of his low flow low gradient severe aortic stenosis.  Assessment & Plan   1. NSTEMI -Admitted to [facility] with non-STEMI.  Left heart cath with severe ostial stenosis of the RCA 99%.  He is lost his arterial graft to the RCA.  Other coronary anatomy stable.  Patent LIMA to LAD.  Patent vein graft to OM. -He is also having anemia.  No signs of bleeding.  I did discuss with Dr. Midge Minium that the best course of action is to proceed with PCI to the RCA.  Given the tight ostial lesion holding on evaluation by GI may be problematic.  He is recommended to proceed with PCI and to continue to work-up his GI bleed in the interim.  We both feel that pausing care of his cardiac disease to work-up the GI bleed would not be in his best interest.  It may be problematic given propofol and sedating him for EGD or colonoscopy with such a tight ostial stenosis. -We will continue aspirin, clopidogrel and heparin for now.  We will stop heparin after PCI. -Continue  beta-blocker. -Continue statin. -Wean nitro drip.  Likely will come off after PCI.  2.  Low flow low gradient severe aortic stenosis -EF is down 30 to 35%.  In my opinion he has severe low-flow low gradient aortic stenosis.  He will undergo TAVR work-up after PCI has been performed.  We will likely end up on dialysis.  See discussion below.  3.  CKD stage IV -Nephrology following.  Given high-dose Lasix yesterday.  Still volume overloaded.  They are basically awaiting for him to end up on hemodialysis.  We will proceed with PCI to RCA today.  I highly suspect he will end up needing inpatient TAVR.  Highly doubt he will tolerate hemodialysis well with severe aortic stenosis.  We will continue to monitor this closely.  4.  Ischemic cardiomyopathy, EF 30-35% -Discussion of coronary disease above.  Treatment plan. -Unclear how much of his aortic valve is playing into this.  Clearly he does need TAVR.  Extremely complicated course complicated by anemia as well as CKD stage IV. -He is on Coreg.  Volume status difficult to control due to AKI.  Nephrology following.  He is on Imdur and hydralazine for afterload reduction.  Clearly no ACE/ARB/Arni therapy in the setting of significant CKD. -Volume status difficult to control and likely will be managed by hemodialysis in the next few days.  5.  Anemia -Admitted to [facility] with hemoglobin around 10.  Has continued to decline.  Was less than 8 yesterday.  Was given 1 unit of packed red blood cells with improvement up to 8.3.  Back down to 7.9 today.  7.9 on recheck. -I have ordered iron studies and we need to check a fecal occult blood test. -No active signs of bleeding that I can see.  Possibly there is an occult GI bleed.  This is extremely common in the setting of CKD and severe aortic stenosis.  We will ask gastroenterology to evaluate him. -I did discuss with interventional cardiology the best course of action.  Given the tight ostial stenosis in the RCA  that is 99%, we  feel that proceeding with PCI is the best option.  This will allow him to remain on aspirin and Plavix and get off heparin.  He was admitted with a non-STEMI.  This will also make any gastroenterology evaluation easier.  6.  Hypertension -Agents for CHF and CAD as above.  7.  Diabetes -Sliding scale insulin  8.  OSA -CPAP at night  FEN -No intravenous fluids -N.p.o. for left heart cath/PCI -DVT PPx: Heparin drip -Code: Full  For questions or updates, please contact [place] Please consult [email or web address] for contact info under   Time Spent with Patient: I have spent a total of 35 minutes with patient reviewing hospital notes, telemetry, EKGs, labs and examining the patient as well as establishing an assessment and plan that was discussed with the patient.  > 50% of time was spent in direct patient care.    Signed, Addison Naegeli. Audie Box, [place]  03/23/2020 10:00 AM

## 2020-03-23 NOTE — Plan of Care (Signed)
  Problem: Education: Goal: Knowledge of General Education information will improve Description: Including pain rating scale, medication(s)/side effects and non-pharmacologic comfort measures Outcome: Progressing   Problem: Clinical Measurements: Goal: Ability to maintain clinical measurements within normal limits will improve Outcome: Progressing   Problem: Clinical Measurements: Goal: Will remain free from infection Outcome: Progressing   Problem: Nutrition: Goal: Adequate nutrition will be maintained Outcome: Progressing   

## 2020-03-23 NOTE — Consult Note (Addendum)
[facility] Consult: 9:36 AM 03/23/2020  LOS: 2 days    Referring Provider: Dr Eleonore Chiquito, cardiology  Primary Care Physician:  [name], MD Primary Gastroenterologist:  Dr. Hilarie Fredrickson    Reason for Consultation:  Anemia.     HPI: [name] [name] is a 64 y.o. male.  PMH CAD.  S/p 2012 CABG.  PVD.  Carotid endarterectomy.  Stents to iliacs and SFAs. Chronic Plavix.  Aortic stenosis.  Htn.  Hld.  IDDM. CKD stage 5.   OSA, BiPAP 24/7 but no supplemental oxygen at home.  Obesity.   Multiple hepatic and splenic granulomas per CT 2015. GERD.  H Pylori + 2015, confirmed eradication May 2015.  12 adenomatous colon polyps in 2015.  Constipation.    2016 Colonoscopy.  For adenomatous polyp surveillance, dating back many years.  7 sessile polyps (path: TAs and HP) removed throughout colon.  Ascending and L-sided tics. 2016 EGD. For dysphagia.  Empiric dilation of normal appearing esophagus.  Diffuse gastropathy w adherent blood.  No ulcers.  Duodenum normal.  No pathology associated with that EGD. No response to colonoscopy call back letters of 2019 and did not show for appt 06/09/2019.      Cancels most appointments w cardiologist as well as initial heme eval set for 03/24/20.  Patient says transportation has become an issue since he gave up driving.  He lives with children.  He was widowed about a year ago.   Admitted to [facility] w unstabel angina, NSTEMI.  03/21/2020 cardiac cath for unstable angina: 3 vs dz, occluded RCA graft at ostium, severe Ao stenosis.  Tsferred to [facility] hospital for cath and TAVR workup.   Awaiting repeat cath with PCI, today's procedure postponed to allow for transfusion.   Hgb 7.1 >> 1 PRBC >> 8.5 >> 7.9 >> 1 PRBC ordered.    MCV 101.  11.9 on 11/04/19, 13 in 03/2019.  Ferritin 32.  Normal iron,  sats, TIBC, B12 and folate.   INR 1.1.   GFR 14.    No black or bloody stools, no BPR.[name]  Chronic constipation, no prior laxatives have worked for him.  Has bowel movements every 2 to 3 days.  Intermittent breakthrough reflux symptoms sometimes triggered by something it is benign as ice water.  Avoid spicy foods as these trigger reflux/GERD.  No dysphagia. Good appetite. Weight fluctuates 10 pounds 217# to 227#.  279# in 2016.       Past Medical History:  Diagnosis Date  . 3-vessel CAD    8/30 LHC with radial graft to RCA occluded at the ostium with critical stenosis of the ostial native RCA.  . Adenomatous colon polyp   . Allergy   . Anxiety   . Arthritis    RA  . Bell's palsy 2007  . CAD (coronary artery disease)    03/21/2020 LHC with patent LIMA to LAD and SVG to OM 3.  Radial graft to RCA occluded at the ostium with critical stenosis of the ostial and native RCA.  . Carotid artery occlusion  Bilateral carotid stenosis s/p left-sided CEA.  . Cataract    Dr. Dawna Part  . CKD (chronic kidney disease), stage IV ([place])    03/2020 vascular surgery access planned  . Depression   . Diabetes mellitus   . Diverticulosis   . Duodenitis   . DVT (deep venous thrombosis) (H[facility])    in leg  . ESRD (end stage renal disease) ([place])   . Gastropathy   . GERD (gastroesophageal reflux disease)   . Heart failure with preserved ejection fraction ([place])    8/30 RHC with moderately to severely elevated filling pressures and pulmonary wedge pressure 31 mmHg, moderate pulmonary hypertension  . Helicobacter pylori gastritis   . Hx of CABG    2012  . Hyperlipidemia   . Hypertension   . Mitral regurgitation and aortic stenosis    RHC 8/30 and echo 12/2019.  [name] Neuropathy   . Obesity   . Peripheral vascular disease ([place])    S/p extensive revascularization by vascular surgery 05/2019  . Post splenectomy syndrome   . Psoriasis   . Severe aortic stenosis    03/21/20 RHC with peak gradient 27.4 mmHg and  valve area 0.87.  [name] Sleep apnea    uses cpap  . Stroke ([place])   . Tobacco use disorder    recently quit  . Tubular adenoma 08/2013   Dr. Hilarie Fredrickson  . Weak urinary stream     Past Surgical History:  Procedure Laterality Date  . CARDIAC CATHETERIZATION  08/2010   LAD: 80% ISR, RCA: 80% ostial, OM 80-90%  . CAROTID ENDARTERECTOMY  08/2010   left/ Dr. Kellie Simmering  . CATARACT EXTRACTION W/PHACO Left 07/09/2017   Procedure: CATARACT EXTRACTION PHACO AND INTRAOCULAR LENS PLACEMENT (IOC);  Surgeon: Birder Robson, MD;  Location: [facility] ORS;  Service: Ophthalmology;  Laterality: Left;  [place] 00:23 AP% 14.2 CDE 3.26 Fluid pack lot # [id number]  . CATARACT EXTRACTION W/PHACO Right 09/10/2017   Procedure: CATARACT EXTRACTION PHACO AND INTRAOCULAR LENS PLACEMENT (IOC);  Surgeon: Birder Robson, MD;  Location: [facility] ORS;  Service: Ophthalmology;  Laterality: Right;  [place] 00:26.4 AP% 17.3 CDE 4.59 Fluid Pack Lot # [id number]  . COLONOSCOPY    . CORONARY ANGIOPLASTY     LAD: before CABG  . CORONARY ARTERY BYPASS GRAFT  09/06/2010   At [facility]: LIMA to LAD, left radial to RCA, sequential SVG to OM3 and 4  . heart stents  Jan 2011   leg stents 06/2009 and 03/2010  . LOWER EXTREMITY ANGIOGRAPHY Left 06/09/2019   Procedure: LOWER EXTREMITY ANGIOGRAPHY;  Surgeon: Katha Cabal, MD;  Location: [place] [facility];  Service: Cardiovascular;  Laterality: Left;  . PTA of illiac and SFA  multiple   Dr. Ronalee Belts, s/p revision 11/2012  . RIGHT AND LEFT HEART CATH N/A 03/21/2020   Procedure: RIGHT AND LEFT HEART CATH possible percutaneous intervention;  Surgeon: Wellington Hampshire, MD;  Location: [place] [facility];  Service: Cardiovascular;  Laterality: N/A;  . RIGHT/LEFT HEART CATH AND CORONARY/GRAFT ANGIOGRAPHY N/A 02/15/2020   Procedure: RIGHT/LEFT HEART CATH AND CORONARY/GRAFT ANGIOGRAPHY;  Surgeon: Wellington Hampshire, MD;  Location: [place] [facility];  Service: Cardiovascular;  Laterality: N/A;  . SPLENECTOMY      . VASCULAR SURGERY     LEG STENTS    Prior to Admission medications   Medication Sig Start Date End Date Taking? Authorizing Provider  cyclobenzaprine (FLEXERIL) 10 MG tablet Take 10 mg by mouth 3 (three) times daily as needed for muscle spasms.  Yes [provider]  albuterol (PROAIR HFA) 108 (90 Base) MCG/ACT inhaler Inhale 2 puffs into the lungs every 6 (six) hours as needed for shortness of breath. 09/14/19   [name], MD  amLODipine (NORVASC) 2.5 MG tablet Take 1 tablet (2.5 mg total) by mouth daily. 02/19/20   Sharen Hones, MD  aspirin 81 MG tablet Take 81 mg by mouth daily.    [provider]  buPROPion (WELLBUTRIN SR) 150 MG 12 hr tablet Take 1 tablet (150 mg total) by mouth daily. Patient not taking: Reported on 03/20/2020 02/23/20   [name], MD  busPIRone (BUSPAR) 10 MG tablet TAKE 2 TABLETS(20 MG) BY MOUTH TWICE DAILY Patient taking differently: Take 20 mg by mouth 2 (two) times daily.  02/19/20   [name], MD  calcitRIOL (ROCALTROL) 0.25 MCG capsule Take 0.25 mcg by mouth every Monday, Wednesday, and Friday. Monday then Wednesday then Friday    [provider]  carvedilol (COREG) 12.5 MG tablet Take 1 tablet (12.5 mg total) by mouth 2 (two) times daily. Patient taking differently: Take 12.5 mg by mouth daily.  12/10/19   Wellington Hampshire, MD  Dextran 70-Hypromellose (ARTIFICIAL TEARS) 0.1-0.3 % SOLN Place 1 drop into both eyes 4 (four) times daily as needed (dry eyes).    [provider]  finasteride (PROSCAR) 5 MG tablet Take 5 mg by mouth daily.    [provider]  heparin 25000-0.45 UT/250ML-% infusion Inject 1,450 Units/hr into the vein continuous. 03/21/20   Nicole Kindred A, DO  insulin aspart (NOVOLOG) 100 UNIT/ML injection Inject 0-5 Units into the skin at bedtime. 03/21/20   Nicole Kindred A, DO  insulin aspart (NOVOLOG) 100 UNIT/ML injection Inject 0-9 Units into the skin 3 (three) times daily with meals.  03/21/20   Ezekiel Slocumb, DO  isosorbide mononitrate (IMDUR) 30 MG 24 hr tablet Take 3 tablets (90 mg total) by mouth daily. 02/23/20   Dunn, Areta Haber, PA-C  nitroGLYCERIN (NITROSTAT) 0.4 MG SL tablet Place 1 tablet (0.4 mg total) under the tongue every 5 (five) minutes as needed. Maximum of 3 doses. Patient taking differently: Place 0.4 mg under the tongue every 5 (five) minutes as needed for chest pain. Maximum of 3 doses. 02/23/20   [name], PA-C  nitroGLYCERIN 0.2 mg/mL infusion Inject 0-200 mcg/min into the vein continuous. 03/21/20   Ezekiel Slocumb, DO  predniSONE (DELTASONE) 50 MG tablet Take 1 tablet at 11 pm on night before procedure, take 1 tablet at 5 am the morning of procedure and take 1 tablet at 9:30 AM the morning of procedure 03/11/20   [name], PA-C  sertraline (ZOLOFT) 100 MG tablet Take 1 tablet (100 mg total) by mouth daily. 02/23/20   [name], MD  simvastatin (ZOCOR) 20 MG tablet TAKE 1 TABLET(20 MG) BY MOUTH DAILY AT 6 PM Patient taking differently: Take 20 mg by mouth every evening.  11/20/19   Wellington Hampshire, MD  tamsulosin (FLOMAX) 0.4 MG CAPS capsule Take 1 capsule by mouth daily Patient taking differently: Take 0.4 mg by mouth daily.  01/20/20   [name], MD  torsemide (DEMADEX) 20 MG tablet Take 2 tablets (40 mg total) by mouth daily. 02/24/20   [name], MD    Scheduled Meds: . sodium chloride   Intravenous Once  . amLODipine  2.5 mg Oral Daily  . aspirin EC  81 mg Oral Daily  . busPIRone  20  mg Oral BID  . calcitRIOL  0.25 mcg Oral Q M,W,F  . calcium carbonate  250 mg Oral TID  . carvedilol  12.5 mg Oral Daily  . clopidogrel  75 mg Oral Daily  . diphenhydrAMINE  50 mg Oral Once  . finasteride  5 mg Oral Daily  . hydrALAZINE  10 mg Oral Q8H  . insulin aspart  0-9 Units Subcutaneous Q4H  . isosorbide mononitrate  90 mg Oral Daily  . predniSONE  50 mg Oral Q6H  . sertraline  100 mg Oral QHS  . sodium chloride flush  3 mL  Intravenous Q12H  . sodium chloride flush  3 mL Intravenous Q12H  . tamsulosin  0.4 mg Oral Daily  . torsemide  40 mg Oral Daily   Infusions: . sodium chloride    . sodium chloride    . sodium chloride 10 mL/hr at 03/23/20 0643  . heparin 1,450 Units/hr (03/23/20 0336)  . nitroGLYCERIN 30 mcg/min (03/23/20 0021)   PRN Meds: sodium chloride, sodium chloride, acetaminophen, albuterol, alum & mag hydroxide-simeth, cyclobenzaprine, hydroxypropyl methylcellulose / hypromellose, nitroGLYCERIN, ondansetron (ZOFRAN) IV, sodium chloride flush, sodium chloride flush   Allergies as of 03/21/2020 - Review Complete 03/21/2020  Allergen Reaction Noted  . Contrast media [iodinated diagnostic agents] Rash and Other (See Comments) 08/06/2011  . Glipizide Other (See Comments) 03/05/2011  . Hydroxychloroquine Other (See Comments) 03/11/2020  . Metrizamide Other (See Comments) 03/25/2014  . Penicillins Hives and Swelling 08/23/2010    Family History  Problem Relation Age of Onset  . Lung cancer Father 65  . Arthritis Father   . Breast cancer Sister 76  . Alcoholism Sister   . Dementia Mother   . Alzheimer's disease Mother   . Arthritis Mother   . Alcoholism Brother   . Epilepsy Sister   . Diabetes Paternal Grandfather   . Colon polyps Paternal Grandfather 33  . Alcoholism Sister   . Alcoholism Sister   . Alcoholism Brother     Social History   Socioeconomic History  . Marital status: Widowed    Spouse name: Not on file  . Number of children: 2  . Years of education: Not on file  . Highest education level: Not on file  Occupational History  . Occupation: Chief Operating Officer at [employer]: Disabled mostly due to neuropathy  Tobacco Use  . Smoking status: Former Smoker    Packs/day: 0.75    Years: 40.00    Pack years: 30.00    Types: Cigarettes    Quit date: 04/15/2017    Years since quitting: 2.9  . Smokeless tobacco: Never Used  . Tobacco comment: 1 cigarette a day  Vaping  Use  . Vaping Use: Never used  Substance and Sexual Activity  . Alcohol use: No  . Drug use: No  . Sexual activity: Never  Other Topics Concern  . Not on file  Social History Narrative   Wife died January 14, 2019      Has living will   Gearldine Shown and his wife Levada Dy should be his health care POA   Would accept resuscitation --but no prolonged ventilation   No tube feeds if cognitively unaware   Social Determinants of Health   Financial Resource Strain: Low Risk   . Difficulty of Paying Living Expenses: Not very hard  Food Insecurity: No Food Insecurity  . Worried About Charity fundraiser in the Last Year: Never true  . Ran Out of Food in the  Last Year: Never true  Transportation Needs: No Transportation Needs  . Lack of Transportation (Medical): No  . Lack of Transportation (Non-Medical): No  Physical Activity:   . Days of Exercise per Week: Not on file  . Minutes of Exercise per Session: Not on file  Stress: No Stress Concern Present  . Feeling of Stress : Only a little  Social Connections: Unknown  . Frequency of Communication with Friends and Family: More than three times a week  . Frequency of Social Gatherings with Friends and Family: Not on file  . Attends Religious Services: Not on file  . Active Member of Clubs or Organizations: Not on file  . Attends Archivist Meetings: Not on file  . Marital Status: Not on file  Intimate Partner Violence: Unknown  . Fear of Current or Ex-Partner: No  . Emotionally Abused: No  . Physically Abused: No  . Sexually Abused: Not on file    REVIEW OF SYSTEMS: Constitutional: Some fatigue, no profound weakness. ENT:  No nose bleeds Pulm: Shortness of breath.  No cough. CV:  No palpitations, no LE edema.  No current angina. GU:  No hematuria, no frequency GI: See HPI. Heme: Bruises easily.  No excessive bleeding. Transfusions: Recalls having had transfusion as a "67-year-old". Neuro:  No headaches, no peripheral tingling  or numbness Derm:  No itching, no rash or sores.  Endocrine:  No sweats or chills.  No polyuria or dysuria Immunization: Not queried. Travel:  None beyond local counties in last few months.    PHYSICAL EXAM: Vital signs in last 24 hours: Vitals:   03/23/20 0636 03/23/20 0800  BP: 131/76 132/70  Pulse:  91  Resp:  18  Temp:  (!) 97.5 F (36.4 C)  SpO2:  100%   Wt Readings from Last 3 Encounters:  03/23/20 106.7 kg  03/21/20 106 kg  03/11/20 104.2 kg    General: Obese, comfortable, slightly cranky but alert.  Does not look acutely ill. Head: No facial asymmetry or swelling.  No signs of head trauma.  Wearing snout like BiPAP mask Eyes: No scleral icterus.  No conjunctival pallor.  EOMI Ears: Not hard of hearing Nose: Wearing BiPAP mask. Mouth: Oropharynx moist, pink, clear.  Tongue midline. Neck: No JVD, no masses, no thyromegaly Lungs: No labored breathing, no cough.  Bilateral rales. Heart: RRR with 3/6 SEM.  S1, S2 audible. Abdomen: Obese.  Soft.  No masses, bruits, hernias, HSM.  Not tender.  Bowel sounds active.   Rectal: Stool is orangeish brown, formed hard stool just at distal end of finger, tests trace FOBT positive.  No masses. Musc/Skeltl: No joint redness, swelling or gross deformity. Extremities: Trace edema.  No significant bruising groin or distal radial cath site. Neurologic: Alert.  Oriented x3.  Moves all 4 limbs without tremor, strength not tested. Skin: Pale, no telangiectasia, sores, rashes.  No significant hematoma/purpura Tattoos: None observed Nodes: No cervical adenopathy Psych: Cooperative but slightly agitated but slightly and upset.  Intake/Output from previous day: 08/31 0701 - 09/01 0700 In: 1726.1 [P.O.:960; I.V.:446.1; Blood:320] Out: 3536 [Urine:1450] Intake/Output this shift: No intake/output data recorded.  LAB RESULTS: Recent Labs    03/21/20 0341 03/21/20 0341 03/22/20 0654 03/22/20 0654 03/22/20 1815 03/23/20 0524  03/23/20 0728  WBC 5.9  --  8.7  --   --  8.3  --   HGB 7.8*   < > 7.1*   < > 8.5* 7.9* 7.9*  HCT 23.9*   < >  22.5*   < > 27.0* 25.1* 25.1*  PLT 125*  --  135*  --   --  128*  --    < > = values in this interval not displayed.   BMET Lab Results  Component Value Date   NA 133 (L) 03/23/2020   NA 134 (L) 03/22/2020   NA 138 03/20/2020   K 4.8 03/23/2020   K 3.9 03/22/2020   K 3.8 03/20/2020   CL 97 (L) 03/23/2020   CL 99 03/22/2020   CL 105 03/20/2020   CO2 23 03/23/2020   CO2 23 03/22/2020   CO2 20 (L) 03/20/2020   GLUCOSE 218 (H) 03/23/2020   GLUCOSE 210 (H) 03/22/2020   GLUCOSE 178 (H) 03/20/2020   BUN 85 (H) 03/23/2020   BUN 82 (H) 03/22/2020   BUN 63 (H) 03/20/2020   CREATININE 4.68 (H) 03/23/2020   CREATININE 4.47 (H) 03/22/2020   CREATININE 4.58 (H) 03/20/2020   CALCIUM 8.6 (L) 03/23/2020   CALCIUM 8.5 (L) 03/22/2020   CALCIUM 7.8 (L) 03/20/2020   LFT No results for input(s): PROT, ALBUMIN, AST, ALT, ALKPHOS, BILITOT, BILIDIR, IBILI in the last 72 hours. PT/INR Lab Results  Component Value Date   INR 1.1 03/20/2020   INR 1.0 02/13/2020   INR 1.02 07/02/2016   Hepatitis Panel No results for input(s): HEPBSAG, HCVAB, HEPAIGM, HEPBIGM in the last 72 hours. C-Diff No components found for: CDIFF Lipase     Component Value Date/Time   LIPASE 25 02/13/2020 0033    Drugs of Abuse  No results found for: LABOPIA, COCAINSCRNUR, LABBENZ, AMPHETMU, THCU, LABBARB   RADIOLOGY STUDIES: ECHOCARDIOGRAM COMPLETE  Result Date: 03/22/2020    ECHOCARDIOGRAM REPORT   Patient Name:   SAKETH DAUBERT Date of Exam: 03/22/2020 Medical Rec #:  [id number]        Height:       66.0 in Accession #:    [id number]       Weight:       233.9 lb Date of Birth:  [birth year]         BSA:          2.137 m Patient Age:    74 years         BP:           113/70 mmHg Patient Gender: M                HR:           79 bpm. Exam Location:  Inpatient Procedure: 2D Echo, Cardiac Doppler and Color  Doppler Indications:    Chest Pain 786.50 / R07.9  History:        Patient has prior history of Echocardiogram examinations, most                 recent 01/14/2020. CAD, Stroke; Risk Factors:Hypertension,                 Dyslipidemia, Diabetes and GERD.  Sonographer:    Bernadene Person RDCS Referring Phys: 3 [place]  1. Left ventricular ejection fraction, by estimation, is 30-35%. The left ventricle has moderately decreased function. The left ventricle demonstrates regional wall motion abnormalities, basal to mid inferior and inferoseptal akinesis, basal to mid inferolateral severe hypokinesis. The left ventricular internal cavity size was mildly dilated. There is mild left ventricular hypertrophy. Left ventricular diastolic parameters are consistent with Grade II diastolic dysfunction (pseudonormalization).  2. Right ventricular systolic function is moderately  reduced. The right ventricular size is normal. There is mildly elevated pulmonary artery systolic pressure. The estimated right ventricular systolic pressure is 21.1 mmHg.  3. Left atrial size was moderately dilated.  4. The mitral valve is degenerative. Moderate to severe mitral valve regurgitation. No evidence of mitral stenosis.  5. The aortic valve is tricuspid. Aortic valve regurgitation is mild. Moderate aortic valve stenosis. Aortic valve area, by VTI measures 1.08 cm. Aortic valve mean gradient measures 22.0 mmHg.  6. Aortic dilatation noted. There is mild dilatation of the ascending aorta measuring 42 mm.  7. The inferior vena cava is dilated in size with >50% respiratory variability, suggesting right atrial pressure of 8 mmHg. FINDINGS  Left Ventricle: Left ventricular ejection fraction, by estimation, is 30 to 35%. The left ventricle has moderately decreased function. The left ventricle demonstrates regional wall motion abnormalities. The left ventricular internal cavity size was mildly dilated. There is mild left ventricular  hypertrophy. Left ventricular diastolic parameters are consistent with Grade II diastolic dysfunction (pseudonormalization). Right Ventricle: The right ventricular size is normal. No increase in right ventricular wall thickness. Right ventricular systolic function is moderately reduced. There is mildly elevated pulmonary artery systolic pressure. The tricuspid regurgitant velocity is 2.71 m/s, and with an assumed right atrial pressure of 8 mmHg, the estimated right ventricular systolic pressure is 94.1 mmHg. Left Atrium: Left atrial size was moderately dilated. Right Atrium: Right atrial size was normal in size. Pericardium: There is no evidence of pericardial effusion. Mitral Valve: The mitral valve is degenerative in appearance. There is moderate calcification of the mitral valve leaflet(s). Mild to moderate mitral annular calcification. Moderate to severe mitral valve regurgitation. No evidence of mitral valve stenosis. Tricuspid Valve: The tricuspid valve is normal in structure. Tricuspid valve regurgitation is mild. Aortic Valve: The aortic valve is tricuspid. Aortic valve regurgitation is mild. Moderate aortic stenosis is present. Aortic valve mean gradient measures 22.0 mmHg. Aortic valve peak gradient measures 34.9 mmHg. Aortic valve area, by VTI measures 1.08 cm. Pulmonic Valve: The pulmonic valve was normal in structure. Pulmonic valve regurgitation is not visualized. Aorta: The aortic root is normal in size and structure and aortic dilatation noted. There is mild dilatation of the ascending aorta measuring 42 mm. Venous: The inferior vena cava is dilated in size with greater than 50% respiratory variability, suggesting right atrial pressure of 8 mmHg. IAS/Shunts: No atrial level shunt detected by color flow Doppler.  LEFT VENTRICLE PLAX 2D LVIDd:         6.50 cm      Diastology LVIDs:         6.20 cm      LV e' lateral:   6.53 cm/s LV PW:         1.00 cm      LV E/e' lateral: 25.1 LV IVS:        0.90 cm       LV e' medial:    3.16 cm/s LVOT diam:     2.20 cm      LV E/e' medial:  51.9 LV SV:         76 LV SV Index:   35 LVOT Area:     3.80 cm  LV Volumes (MOD) LV vol d, MOD A2C: 114.0 ml LV vol d, MOD A4C: 134.0 ml LV vol s, MOD A2C: 66.5 ml LV vol s, MOD A4C: 110.0 ml LV SV MOD A2C:     47.5 ml LV SV MOD A4C:  134.0 ml LV SV MOD BP:      35.4 ml RIGHT VENTRICLE RV S prime:     6.66 cm/s TAPSE (M-mode): 1.3 cm LEFT ATRIUM             Index       RIGHT ATRIUM           Index LA diam:        5.70 cm 2.67 cm/m  RA Area:     13.40 cm LA Vol (A2C):   60.5 ml 28.31 ml/m RA Volume:   29.50 ml  13.80 ml/m LA Vol (A4C):   91.3 ml 42.72 ml/m LA Biplane Vol: 75.5 ml 35.32 ml/m  AORTIC VALVE AV Area (Vmax):    1.15 cm AV Area (Vmean):   1.19 cm AV Area (VTI):     1.08 cm AV Vmax:           295.33 cm/s AV Vmean:          217.333 cm/s AV VTI:            0.699 m AV Peak Grad:      34.9 mmHg AV Mean Grad:      22.0 mmHg LVOT Vmax:         89.20 cm/s LVOT Vmean:        67.800 cm/s LVOT VTI:          0.199 m LVOT/AV VTI ratio: 0.28  AORTA Ao Root diam: 3.60 cm Ao Asc diam:  4.20 cm MITRAL VALVE                 TRICUSPID VALVE MV Area (PHT): 2.82 cm      TR Peak grad:   29.4 mmHg MV Decel Time: 269 msec      TR Vmax:        271.00 cm/s MR Peak grad:    114.9 mmHg MR Mean grad:    69.0 mmHg   SHUNTS MR Vmax:         536.00 cm/s Systemic VTI:  0.20 m MR Vmean:        391.0 cm/s  Systemic Diam: 2.20 cm MR PISA:         0.57 cm MR PISA Eff ROA: 4 mm MR PISA Radius:  0.30 cm MV E velocity: 164.00 cm/s MV A velocity: 103.00 cm/s MV E/A ratio:  1.59 Loralie Champagne MD Electronically signed by Loralie Champagne MD Signature Date/Time: 03/22/2020/4:19:53 PM    Final       IMPRESSION:   *    FOBT positive anemia.  Stool is orange is brown. Hx recurrent adenomatous polyps dating back many years, overdue for surveillance colonoscopy last performed with findings of adenomatous and hyperplastic polyps in 2016. Had diffuse  gastropathy with adherent blood at EGD in 2016.  Not currently taking any PPI or H2 blocker.  Previously documented eradication of H. pylori in 2015. S/p 1 PRBC, additional PRBC just hung. This anemia is certainly multifactorial and his advanced renal disease is a significant contributor.  May still have gastropathy associated bleeding as well as potential for recurrent colon polyps.  *    CAD.  NSTEMI.  Critical RCA disease with occluded RCA graft.  For stenting this afternoon. Chronic low-dose aspirin and Plavix.  *    CKD stage V.  Renal MDs following.    PLAN:     *   Cardiac cath with stent today.  *    Dr. Fuller Plan will see the  patient and make decisions regarding endoscopic work-up.  Would need to be done while the patient is on Plavix so diagnostic studies only.   Azucena Freed  03/23/2020, 9:36 AM Phone [phone]    Attending Physician Note   I have taken a history, examined the patient and reviewed the chart. I agree with the Advanced Practitioner's note, impression and recommendations.  Occult blood in stool and anemia c/w IDA (ferritin < 45). No overt GI bleeding noted.  History of adenomatous colon polyps.  NSTEMI, CAD for cath possible intervention today. Pt on Plavix.   CKD  Colonoscopy and EGD to further evaluate with timing to be determined. Ideally would wait at least 6-8 weeks for recovery post NSTEMI. Pt likely needs to remain on daily Plavix. Reassess after cath with possible intervention. Transfusions to keep Hgb > 8 given cardiac events. Trend CBC.   Lucio Edward, MD [facility]

## 2020-03-23 NOTE — Progress Notes (Signed)
Cardiology Progress Note  Patient ID: [name] [name] MRN: [id number] DOB: 18-May-1956 Date of Encounter: 03/23/2020  Primary Cardiologist: [name], MD  Subjective   Chief Complaint: None.  HPI: Received 1 unit packed red blood cells yesterday.  Hemoglobin has drifted down again.  No active signs of bleeding.  No chest pain reported.  Remains on heparin.  Plan for PCI today.  ROS:  All other ROS reviewed and negative. Pertinent positives noted in the HPI.     Inpatient Medications  Scheduled Meds:  sodium chloride   Intravenous Once   amLODipine  2.5 mg Oral Daily   aspirin EC  81 mg Oral Daily   busPIRone  20 mg Oral BID   calcitRIOL  0.25 mcg Oral Q M,W,F   calcium carbonate  250 mg Oral TID   carvedilol  12.5 mg Oral Daily   clopidogrel  75 mg Oral Daily   diphenhydrAMINE  50 mg Oral Once   finasteride  5 mg Oral Daily   hydrALAZINE  10 mg Oral Q8H   insulin aspart  0-9 Units Subcutaneous Q4H   isosorbide mononitrate  90 mg Oral Daily   predniSONE  50 mg Oral Q6H   sertraline  100 mg Oral QHS   sodium chloride flush  3 mL Intravenous Q12H   sodium chloride flush  3 mL Intravenous Q12H   tamsulosin  0.4 mg Oral Daily   torsemide  40 mg Oral Daily   Continuous Infusions:  sodium chloride     sodium chloride     sodium chloride 10 mL/hr at 03/23/20 0643   heparin 1,450 Units/hr (03/23/20 0336)   nitroGLYCERIN 30 mcg/min (03/23/20 0021)   PRN Meds: sodium chloride, sodium chloride, acetaminophen, albuterol, alum & mag hydroxide-simeth, cyclobenzaprine, hydroxypropyl methylcellulose / hypromellose, nitroGLYCERIN, ondansetron (ZOFRAN) IV, sodium chloride flush, sodium chloride flush   Vital Signs   Vitals:   03/23/20 0015 03/23/20 0303 03/23/20 0636 03/23/20 0800  BP:  127/73 131/76 132/70  Pulse:  100  91  Resp:  18  18  Temp:  98 F (36.7 C)  (!) 97.5 F (36.4 C)  TempSrc:  Oral  Oral  SpO2:  94%  100%  Weight: 106.7 kg       Height:        Intake/Output Summary (Last 24 hours) at 03/23/2020 1000 Last data filed at 03/23/2020 0113 Gross per 24 hour  Intake 1726.09 ml  Output 1450 ml  Net 276.09 ml   Last 3 Weights 03/23/2020 03/22/2020 03/21/2020  Weight (lbs) 235 lb 3.2 oz 233 lb 14.4 oz 234 lb 3.2 oz  Weight (kg) 106.686 kg 106.096 kg 106.232 kg  Some encounter information is confidential and restricted. Go to Review Flowsheets activity to see all data.      Telemetry  Overnight telemetry shows sinus rhythm with heart rate in the 90s, which I personally reviewed.   ECG  The most recent ECG shows normal sinus rhythm, heart rate 95, left bundle branch block, which I personally reviewed.   Physical Exam   Vitals:   03/23/20 0015 03/23/20 0303 03/23/20 0636 03/23/20 0800  BP:  127/73 131/76 132/70  Pulse:  100  91  Resp:  18  18  Temp:  98 F (36.7 C)  (!) 97.5 F (36.4 C)  TempSrc:  Oral  Oral  SpO2:  94%  100%  Weight: 106.7 kg     Height:         Intake/Output Summary (Last 24  hours) at 03/23/2020 1000 Last data filed at 03/23/2020 0113 Gross per 24 hour  Intake 1726.09 ml  Output 1450 ml  Net 276.09 ml    Last 3 Weights 03/23/2020 03/22/2020 03/21/2020  Weight (lbs) 235 lb 3.2 oz 233 lb 14.4 oz 234 lb 3.2 oz  Weight (kg) 106.686 kg 106.096 kg 106.232 kg  Some encounter information is confidential and restricted. Go to Review Flowsheets activity to see all data.    Body mass index is 37.96 kg/m.  General: Well nourished, well developed, in no acute distress Head: Atraumatic, normal size  Eyes: PEERLA, EOMI  Neck: Supple, +JVD Endocrine: No thryomegaly Cardiac: Normal S1, S2; RRR; 3 out of 6 systolic ejection murmur, late peaking Lungs: Rales present bilaterally Abd: Soft, nontender, no hepatomegaly  Ext: Trace edema, right femoral cath site clean and dry, right radial cath site clean and dry Musculoskeletal: No deformities, BUE and BLE strength normal and equal Skin: Warm and dry, no rashes    Neuro: Alert and oriented to person, place, time, and situation, CNII-XII grossly intact, no focal deficits  Psych: Normal mood and affect   Labs  High Sensitivity Troponin:   Recent Labs  Lab 03/11/20 1134 03/20/20 0620 03/20/20 0827 03/20/20 1255  TROPONINIHS 15 19* 51* 86*     Cardiac EnzymesNo results for input(s): TROPONINI in the last 168 hours. No results for input(s): TROPIPOC in the last 168 hours.  Chemistry Recent Labs  Lab 03/20/20 [phone] 03/20/20 0620 03/20/20 0919 03/22/20 0654 03/23/20 0524  NA 138  --   --  134* 133*  K 3.8  --   --  3.9 4.8  CL 105  --   --  99 97*  CO2 20*  --   --  23 23  GLUCOSE 178*  --   --  210* 218*  BUN 63*  --   --  82* 85*  CREATININE 4.21*   < > 4.58* 4.47* 4.68*  CALCIUM 7.8*  --   --  8.5* 8.6*  GFRNONAA 14*   < > 12* 13* 12*  GFRAA 16*   < > 14* 15* 14*  ANIONGAP 13  --   --  12 13   < > = values in this interval not displayed.    Hematology Recent Labs  Lab 03/21/20 0341 03/21/20 0341 03/22/20 0654 03/22/20 0654 03/22/20 1815 03/23/20 0524 03/23/20 0728  WBC 5.9  --  8.7  --   --  8.3  --   RBC 2.42*  --  2.22*  --   --  2.57*  --   HGB 7.8*   < > 7.1*   < > 8.5* 7.9* 7.9*  HCT 23.9*   < > 22.5*   < > 27.0* 25.1* 25.1*  MCV 98.8  --  101.4*  --   --  97.7  --   MCH 32.2  --  32.0  --   --  30.7  --   MCHC 32.6  --  31.6  --   --  31.5  --   RDW 14.1  --  14.3  --   --  16.2*  --   PLT 125*  --  135*  --   --  128*  --    < > = values in this interval not displayed.   BNP Recent Labs  Lab 03/20/20 0620  BNP 503.2*    DDimer No results for input(s): DDIMER in the last 168 hours.   Radiology  ECHOCARDIOGRAM COMPLETE  Result Date: 03/22/2020    ECHOCARDIOGRAM REPORT   Patient Name:   [name] [name] Date of Exam: 03/22/2020 Medical Rec #:  [id number]        Height:       66.0 in Accession #:    [id number]       Weight:       233.9 lb Date of Birth:  [birth year]         BSA:          2.137 m Patient Age:     64 years         BP:           113/70 mmHg Patient Gender: M                HR:           79 bpm. Exam Location:  Inpatient Procedure: 2D Echo, Cardiac Doppler and Color Doppler Indications:    Chest Pain 786.50 / R07.9  History:        Patient has prior history of Echocardiogram examinations, most                 recent 01/14/2020. CAD, Stroke; Risk Factors:Hypertension,                 Dyslipidemia, Diabetes and GERD.  Sonographer:    Bernadene Person RDCS Referring Phys: 32 [place]  1. Left ventricular ejection fraction, by estimation, is 30-35%. The left ventricle has moderately decreased function. The left ventricle demonstrates regional wall motion abnormalities, basal to mid inferior and inferoseptal akinesis, basal to mid inferolateral severe hypokinesis. The left ventricular internal cavity size was mildly dilated. There is mild left ventricular hypertrophy. Left ventricular diastolic parameters are consistent with Grade II diastolic dysfunction (pseudonormalization).  2. Right ventricular systolic function is moderately reduced. The right ventricular size is normal. There is mildly elevated pulmonary artery systolic pressure. The estimated right ventricular systolic pressure is 44.9 mmHg.  3. Left atrial size was moderately dilated.  4. The mitral valve is degenerative. Moderate to severe mitral valve regurgitation. No evidence of mitral stenosis.  5. The aortic valve is tricuspid. Aortic valve regurgitation is mild. Moderate aortic valve stenosis. Aortic valve area, by VTI measures 1.08 cm. Aortic valve mean gradient measures 22.0 mmHg.  6. Aortic dilatation noted. There is mild dilatation of the ascending aorta measuring 42 mm.  7. The inferior vena cava is dilated in size with >50% respiratory variability, suggesting right atrial pressure of 8 mmHg. FINDINGS  Left Ventricle: Left ventricular ejection fraction, by estimation, is 30 to 35%. The left ventricle has moderately decreased  function. The left ventricle demonstrates regional wall motion abnormalities. The left ventricular internal cavity size was mildly dilated. There is mild left ventricular hypertrophy. Left ventricular diastolic parameters are consistent with Grade II diastolic dysfunction (pseudonormalization). Right Ventricle: The right ventricular size is normal. No increase in right ventricular wall thickness. Right ventricular systolic function is moderately reduced. There is mildly elevated pulmonary artery systolic pressure. The tricuspid regurgitant velocity is 2.71 m/s, and with an assumed right atrial pressure of 8 mmHg, the estimated right ventricular systolic pressure is 67.5 mmHg. Left Atrium: Left atrial size was moderately dilated. Right Atrium: Right atrial size was normal in size. Pericardium: There is no evidence of pericardial effusion. Mitral Valve: The mitral valve is degenerative in appearance. There is moderate calcification of the mitral valve leaflet(s). Mild to moderate mitral annular calcification. Moderate  to severe mitral valve regurgitation. No evidence of mitral valve stenosis. Tricuspid Valve: The tricuspid valve is normal in structure. Tricuspid valve regurgitation is mild. Aortic Valve: The aortic valve is tricuspid. Aortic valve regurgitation is mild. Moderate aortic stenosis is present. Aortic valve mean gradient measures 22.0 mmHg. Aortic valve peak gradient measures 34.9 mmHg. Aortic valve area, by VTI measures 1.08 cm. Pulmonic Valve: The pulmonic valve was normal in structure. Pulmonic valve regurgitation is not visualized. Aorta: The aortic root is normal in size and structure and aortic dilatation noted. There is mild dilatation of the ascending aorta measuring 42 mm. Venous: The inferior vena cava is dilated in size with greater than 50% respiratory variability, suggesting right atrial pressure of 8 mmHg. IAS/Shunts: No atrial level shunt detected by color flow Doppler.  LEFT VENTRICLE PLAX  2D LVIDd:         6.50 cm      Diastology LVIDs:         6.20 cm      LV e' lateral:   6.53 cm/s LV PW:         1.00 cm      LV E/e' lateral: 25.1 LV IVS:        0.90 cm      LV e' medial:    3.16 cm/s LVOT diam:     2.20 cm      LV E/e' medial:  51.9 LV SV:         76 LV SV Index:   35 LVOT Area:     3.80 cm  LV Volumes (MOD) LV vol d, MOD A2C: 114.0 ml LV vol d, MOD A4C: 134.0 ml LV vol s, MOD A2C: 66.5 ml LV vol s, MOD A4C: 110.0 ml LV SV MOD A2C:     47.5 ml LV SV MOD A4C:     134.0 ml LV SV MOD BP:      35.4 ml RIGHT VENTRICLE RV S prime:     6.66 cm/s TAPSE (M-mode): 1.3 cm LEFT ATRIUM             Index       RIGHT ATRIUM           Index LA diam:        5.70 cm 2.67 cm/m  RA Area:     13.40 cm LA Vol (A2C):   60.5 ml 28.31 ml/m RA Volume:   29.50 ml  13.80 ml/m LA Vol (A4C):   91.3 ml 42.72 ml/m LA Biplane Vol: 75.5 ml 35.32 ml/m  AORTIC VALVE AV Area (Vmax):    1.15 cm AV Area (Vmean):   1.19 cm AV Area (VTI):     1.08 cm AV Vmax:           295.33 cm/s AV Vmean:          217.333 cm/s AV VTI:            0.699 m AV Peak Grad:      34.9 mmHg AV Mean Grad:      22.0 mmHg LVOT Vmax:         89.20 cm/s LVOT Vmean:        67.800 cm/s LVOT VTI:          0.199 m LVOT/AV VTI ratio: 0.28  AORTA Ao Root diam: 3.60 cm Ao Asc diam:  4.20 cm MITRAL VALVE                 TRICUSPID VALVE  MV Area (PHT): 2.82 cm      TR Peak grad:   29.4 mmHg MV Decel Time: 269 msec      TR Vmax:        271.00 cm/s MR Peak grad:    114.9 mmHg MR Mean grad:    69.0 mmHg   SHUNTS MR Vmax:         536.00 cm/s Systemic VTI:  0.20 m MR Vmean:        391.0 cm/s  Systemic Diam: 2.20 cm MR PISA:         0.57 cm MR PISA Eff ROA: 4 mm MR PISA Radius:  0.30 cm MV E velocity: 164.00 cm/s MV A velocity: 103.00 cm/s MV E/A ratio:  1.59 Loralie Champagne MD Electronically signed by Loralie Champagne MD Signature Date/Time: 03/22/2020/4:19:53 PM    Final     Cardiac Studies  LHC 03/21/2020  Ost Cx to Prox Cx lesion is 85% stenosed.  Mid Cx to Dist  Cx lesion is 100% stenosed.  1st Mrg lesion is 70% stenosed.  Prox LAD to Mid LAD lesion is 90% stenosed.  Ost RCA to Prox RCA lesion is 99% stenosed.  SVG.  The graft exhibits mild diffuse disease.  Left radial artery graft was visualized by angiography.  Origin lesion is 100% stenosed.  LIMA graft was visualized by non-selective angiography and is normal in caliber.  The graft exhibits no disease.   1.  Significant underlying three-vessel coronary artery disease with patent LIMA to LAD and SVG to OM 3.  Radial graft to RCA is occluded at the ostium with critical stenosis of the ostial native right coronary artery.  This is the likely culprit for recent non-STEMI and current unstable angina. 2.  Severe aortic stenosis with peak gradient of 27.4 mmHg and valve area of 0.87. 3.  Right heart catheterization showed moderately to severely elevated filling pressures with pulmonary wedge pressure of 31 mmHg, moderate pulmonary hypertension at 50/30 mmHg and normal cardiac output at 5.06 with a cardiac index of 2.36.  Prominent V waves on wedge pressure tracing suggestive of significant mitral regurgitation.  TTE 03/22/2020 1. Left ventricular ejection fraction, by estimation, is 30-35%. The left  ventricle has moderately decreased function. The left ventricle  demonstrates regional wall motion abnormalities, basal to mid inferior and  inferoseptal akinesis, basal to mid  inferolateral severe hypokinesis. The left ventricular internal cavity  size was mildly dilated. There is mild left ventricular hypertrophy. Left  ventricular diastolic parameters are consistent with Grade II diastolic  dysfunction (pseudonormalization).  2. Right ventricular systolic function is moderately reduced. The right  ventricular size is normal. There is mildly elevated pulmonary artery  systolic pressure. The estimated right ventricular systolic pressure is  17.7 mmHg.  3. Left atrial size was moderately  dilated.  4. The mitral valve is degenerative. Moderate to severe mitral valve  regurgitation. No evidence of mitral stenosis.  5. The aortic valve is tricuspid. Aortic valve regurgitation is mild.  Moderate aortic valve stenosis. Aortic valve area, by VTI measures 1.08  cm. Aortic valve mean gradient measures 22.0 mmHg.  6. Aortic dilatation noted. There is mild dilatation of the ascending  aorta measuring 42 mm.  7. The inferior vena cava is dilated in size with >50% respiratory  variability, suggesting right atrial pressure of 8 mmHg.    Patient Profile  [name] [name] is a 64 y.o. male with CAD status post CABG, CKD 4, chronic anemia, diabetes, morbid obesity,  OSA, PAD, tobacco abuse who was admitted to [facility] on 03/20/2020 with non-STEMI.  He was found to have a very tight native 99% lesion in the ostial RCA.  His bypass graft is down to that vessel.  He was then transferred to [facility] for PCI to the RCA as well as work-up of his low flow low gradient severe aortic stenosis.  Assessment & Plan   1. NSTEMI -Admitted to [facility] with non-STEMI.  Left heart cath with severe ostial stenosis of the RCA 99%.  He is lost his arterial graft to the RCA.  Other coronary anatomy stable.  Patent LIMA to LAD.  Patent vein graft to OM. -He is also having anemia.  No signs of bleeding.  I did discuss with Dr. Midge Minium that the best course of action is to proceed with PCI to the RCA.  Given the tight ostial lesion holding on evaluation by GI may be problematic.  He is recommended to proceed with PCI and to continue to work-up his GI bleed in the interim.  We both feel that pausing care of his cardiac disease to work-up the GI bleed would not be in his best interest.  It may be problematic given propofol and sedating him for EGD or colonoscopy with such a tight ostial stenosis. -We will continue aspirin, clopidogrel and heparin for now.  We will stop heparin after PCI. -Continue  beta-blocker. -Continue statin. -Wean nitro drip.  Likely will come off after PCI.  2.  Low flow low gradient severe aortic stenosis -EF is down 30 to 35%.  In my opinion he has severe low-flow low gradient aortic stenosis.  He will undergo TAVR work-up after PCI has been performed.  We will likely end up on dialysis.  See discussion below.  3.  CKD stage IV -Nephrology following.  Given high-dose Lasix yesterday.  Still volume overloaded.  They are basically awaiting for him to end up on hemodialysis.  We will proceed with PCI to RCA today.  I highly suspect he will end up needing inpatient TAVR.  Highly doubt he will tolerate hemodialysis well with severe aortic stenosis.  We will continue to monitor this closely.  4.  Ischemic cardiomyopathy, EF 30-35% -Discussion of coronary disease above.  Treatment plan. -Unclear how much of his aortic valve is playing into this.  Clearly he does need TAVR.  Extremely complicated course complicated by anemia as well as CKD stage IV. -He is on Coreg.  Volume status difficult to control due to AKI.  Nephrology following.  He is on Imdur and hydralazine for afterload reduction.  Clearly no ACE/ARB/Arni therapy in the setting of significant CKD. -Volume status difficult to control and likely will be managed by hemodialysis in the next few days.  5.  Anemia -Admitted to [facility] with hemoglobin around 10.  Has continued to decline.  Was less than 8 yesterday.  Was given 1 unit of packed red blood cells with improvement up to 8.3.  Back down to 7.9 today.  7.9 on recheck. -I have ordered iron studies and we need to check a fecal occult blood test. -No active signs of bleeding that I can see.  Possibly there is an occult GI bleed.  This is extremely common in the setting of CKD and severe aortic stenosis.  We will ask gastroenterology to evaluate him. -I did discuss with interventional cardiology the best course of action.  Given the tight ostial stenosis in the RCA  that is 99%, we  feel that proceeding with PCI is the best option.  This will allow him to remain on aspirin and Plavix and get off heparin.  He was admitted with a non-STEMI.  This will also make any gastroenterology evaluation easier.  6.  Hypertension -Agents for CHF and CAD as above.  7.  Diabetes -Sliding scale insulin  8.  OSA -CPAP at night  FEN -No intravenous fluids -N.p.o. for left heart cath/PCI -DVT PPx: Heparin drip -Code: Full  For questions or updates, please contact [place] Please consult [email or web address] for contact info under   Time Spent with Patient: I have spent a total of 35 minutes with patient reviewing hospital notes, telemetry, EKGs, labs and examining the patient as well as establishing an assessment and plan that was discussed with the patient.  > 50% of time was spent in direct patient care.    Signed, Addison Naegeli. Audie Box, [place]  03/23/2020 10:00 AM

## 2020-03-23 NOTE — Progress Notes (Addendum)
PROGRESS NOTE  PLUMER MITTELSTAEDT OFB:[id number] DOB: [birth year] DOA: 03/21/2020 PCP: [name], MD   LOS: 2 days   Brief narrative: As per HPI,  [name] [name] is a 64 y.o. male with history of CAD s/p CABG in 2012, severe aortic stenosis by RHC 03/21/2020, carotid disease s/p left-sided CEA with recent 12/2019 , DM2, CKD stage IV, anemia of chronic disease, mildly dilated aortic root, hypertension, hyperlipidemia, PAD s/p extensive lower extremity revascularization by vascular surgery 05/2019, tobacco use though reportedly recently quit, obesity, and psoriasis presented to the hospital on 03/19/2020 with chest pain and was brought into the hospital.  Patient was started on heparin and nitro infusion.  On 03/21/2020 patient underwent cardiac catheterization with three-vessel coronary artery disease with severe aortic stenosis and moderate pulmonary hypertension/significant mitral regurgitation.  Patient was then transferred from [facility] to [facility] for possible TAVR plus RCA PCI.  Patient will likely need hemodialysis prior to surgical intervention.  Medical team was consulted for management of diabetes.  Assessment/Plan:  Principal Problem:   Non-ST elevation (NSTEMI) myocardial infarction [facility]) Active Problems:   3-vessel CAD   Hyperlipidemia, mixed   Essential hypertension   Obstructive sleep apnea   Carotid artery disease (HCC)   Severe aortic stenosis   Obesity   PVD (peripheral vascular disease) (HCC)   DM (diabetes mellitus), type 2 with complications ([place])   CKD stage 4 due to type 2 diabetes mellitus (HCC)   Acute diastolic CHF (congestive heart failure) (HCC)   Chronic heart failure with preserved ejection fraction (HFpEF) (HCC)   Hx of CABG   Anemia   CAD (coronary artery disease)   Diabetes mellitus type 2 with CKD stage IV.  Continue sliding scale insulin, Accu-Cheks, diabetic diet.  Closely monitor.  Latest hemoglobin A1c 1 month ago was 5.7. Hemoglobin A1c  1 year ago at 6.1.  Change sliding scale to every 4 with Accu-Cheks every 4 hourly due to n.p.o. status.  Unstable angina with severe aortic stenosis/mitral regurgitation status post cardiac cath with significant multivessel three-vessel coronary artery disease.  Patient will likely need TAVR and PCI to RCA.  Plan for PCI to RCA today.  On heparin drip.  Further management as per cardiology.  on aspirin, Plavix beta blockers, heparin drip and nitrates.  Volume overload, pulmonary hypertension.  Torsemide as per cardiology.  Seen by nephrology.  Chronic kidney disease stage IV.   Seen by nephrology and there is no plan for dialysis at this time.  Anemia of chronic kidney disease, possible chronic GI blood loss.  Monitor CBC.  Baseline hemoglobin of around 10.  Transfuse to keep more than 9.  Patient was FOBT positive.  FOBT positive anemia.  History of recurrent adenomatous polyp.  Status post 1 unit of packed RBC.  GI has been consulted and possible plan for endoscopic evaluation.  Essential hypertension.  On hydralazine, Coreg, torsemide.  Latest blood pressure 130/83  Sleep apnea.  Continue nocturnal CPAP  DVT prophylaxis: Heparin drip  Code Status: Full code  Procedures: CATH: 03/21/2020  Ost Cx to Prox Cx lesion is 85% stenosed.  Mid Cx to Dist Cx lesion is 100% stenosed.  1st Mrg lesion is 70% stenosed.  Prox LAD to Mid LAD lesion is 90% stenosed.  Ost RCA to Prox RCA lesion is 99% stenosed.  SVG.  The graft exhibits mild diffuse disease.  Left radial artery graft was visualized by angiography.  Origin lesion is 100% stenosed.  LIMA graft was visualized  by non-selective angiography and is normal in caliber.  The graft exhibits no disease.  Antibiotics:  . None  Anti-infectives (From admission, onward)   None      Subjective: Today, patient was seen and examined at bedside.  Very upset about the plan of care.  Denies any chest pain, shortness of breath,  fever or chills.    Objective: Vitals:   03/23/20 1142 03/23/20 1157  BP: 125/65 130/83  Pulse: 88   Resp: 15   Temp: 97.9 F (36.6 C) 97.7 F (36.5 C)  SpO2:  98%    Intake/Output Summary (Last 24 hours) at 03/23/2020 1321 Last data filed at 03/23/2020 0113 Gross per 24 hour  Intake 1726.09 ml  Output 1250 ml  Net 476.09 ml   Filed Weights   03/21/20 2123 03/22/20 0500 03/23/20 0015  Weight: 106.2 kg 106.1 kg 106.7 kg   Body mass index is 37.96 kg/m.   Physical Exam: GENERAL: Patient is alert awake and oriented. Not in obvious distress.   Obese, upset HENT: No scleral pallor or icterus. Pupils equally reactive to light. Oral mucosa is moist NECK: is supple, no gross swelling noted. CHEST: Diminished breath sounds bilaterally. CVS: S1 and S2 heard, no murmur. Regular rate and rhythm.  ABDOMEN: Soft, non-tender, bowel sounds are present. EXTREMITIES: No edema.  Right radial cath site with dressing. CNS: Cranial nerves are intact. No focal motor deficits. SKIN: warm and dry without rashes.  Data Review: I have personally reviewed the following laboratory data and studies,  CBC: Recent Labs  Lab 03/20/20 0620 03/20/20 0620 03/21/20 0341 03/22/20 0654 03/22/20 1815 03/23/20 0524 03/23/20 0728  WBC 6.6  --  5.9 8.7  --  8.3  --   NEUTROABS 4.3  --   --  6.2  --   --   --   HGB 8.0*   < > 7.8* 7.1* 8.5* 7.9* 7.9*  HCT 24.3*   < > 23.9* 22.5* 27.0* 25.1* 25.1*  MCV 97.6  --  98.8 101.4*  --  97.7  --   PLT 125*  --  125* 135*  --  128*  --    < > = values in this interval not displayed.   Basic Metabolic Panel: Recent Labs  Lab 03/20/20 0620 03/20/20 0919 03/22/20 0654 03/23/20 0524  NA 138  --  134* 133*  K 3.8  --  3.9 4.8  CL 105  --  99 97*  CO2 20*  --  23 23  GLUCOSE 178*  --  210* 218*  BUN 63*  --  82* 85*  CREATININE 4.21* 4.58* 4.47* 4.68*  CALCIUM 7.8*  --  8.5* 8.6*  MG 2.2  --   --   --    Liver Function Tests: No results for input(s):  AST, ALT, ALKPHOS, BILITOT, PROT, ALBUMIN in the last 168 hours. No results for input(s): LIPASE, AMYLASE in the last 168 hours. No results for input(s): AMMONIA in the last 168 hours. Cardiac Enzymes: No results for input(s): CKTOTAL, CKMB, CKMBINDEX, TROPONINI in the last 168 hours. BNP (last 3 results) Recent Labs    02/13/20 0033 03/20/20 0620  BNP 713.3* 503.2*    ProBNP (last 3 results) No results for input(s): PROBNP in the last 8760 hours.  CBG: Recent Labs  Lab 03/22/20 1120 03/22/20 1704 03/22/20 2144 03/23/20 0605 03/23/20 1122  GLUCAP 155* 163* 152* 211* 210*   Recent Results (from the past 240 hour(s))  SARS CORONAVIRUS 2 (TAT  6-24 HRS) Nasopharyngeal Nasopharyngeal Swab     Status: None   Collection Time: 03/14/20  3:40 PM   Specimen: Nasopharyngeal Swab  Result Value Ref Range Status   SARS Coronavirus 2 NEGATIVE NEGATIVE Final    Comment: (NOTE) SARS-CoV-2 target nucleic acids are NOT DETECTED.  The SARS-CoV-2 RNA is generally detectable in upper and lower respiratory specimens during the acute phase of infection. Negative results do not preclude SARS-CoV-2 infection, do not rule out co-infections with other pathogens, and should not be used as the sole basis for treatment or other patient management decisions. Negative results must be combined with clinical observations, patient history, and epidemiological information. The expected result is Negative.  Fact Sheet for Patients: SugarRoll.be  Fact Sheet for Healthcare Providers: https://www.woods-mathews.com/  This test is not yet approved or cleared by the [place] FDA and  has been authorized for detection and/or diagnosis of SARS-CoV-2 by FDA under an Emergency Use Authorization (EUA). This EUA will remain  in effect (meaning this test can be used) for the duration of the COVID-19 declaration under Se ction 564(b)(1) of the Act, 21 U.S.C. section  360bbb-3(b)(1), unless the authorization is terminated or revoked sooner.  Performed at [facility], [place] [address], [place], [place] [address]   SARS Coronavirus 2 by RT PCR (hospital order, performed in [facility] hospital lab) Nasopharyngeal Nasopharyngeal Swab     Status: None   Collection Time: 03/20/20  7:28 AM   Specimen: Nasopharyngeal Swab  Result Value Ref Range Status   SARS Coronavirus 2 NEGATIVE NEGATIVE Final    Comment: (NOTE) SARS-CoV-2 target nucleic acids are NOT DETECTED.  The SARS-CoV-2 RNA is generally detectable in upper and lower respiratory specimens during the acute phase of infection. The lowest concentration of SARS-CoV-2 viral copies this assay can detect is 250 copies / mL. A negative result does not preclude SARS-CoV-2 infection and should not be used as the sole basis for treatment or other patient management decisions.  A negative result may occur with improper specimen collection / handling, submission of specimen other than nasopharyngeal swab, presence of viral mutation(s) within the areas targeted by this assay, and inadequate number of viral copies (<250 copies / mL). A negative result must be combined with clinical observations, patient history, and epidemiological information.  Fact Sheet for Patients:   StrictlyIdeas.no  Fact Sheet for Healthcare Providers: BankingDealers.co.za  This test is not yet approved or  cleared by the [place] FDA and has been authorized for detection and/or diagnosis of SARS-CoV-2 by FDA under an Emergency Use Authorization (EUA).  This EUA will remain in effect (meaning this test can be used) for the duration of the COVID-19 declaration under Section 564(b)(1) of the Act, 21 U.S.C. section 360bbb-3(b)(1), unless the authorization is terminated or revoked sooner.  Performed at [facility], [address] [place],  [address]       Studies: ECHOCARDIOGRAM COMPLETE  Result Date: 03/22/2020    ECHOCARDIOGRAM REPORT   Patient Name:   [name] [name] Date of Exam: 03/22/2020 Medical Rec #:  [id number]        Height:       66.0 in Accession #:    [id number]       Weight:       233.9 lb Date of Birth:  [birth year]         BSA:          2.137 m Patient Age:    1 years  BP:           113/70 mmHg Patient Gender: M                HR:           79 bpm. Exam Location:  Inpatient Procedure: 2D Echo, Cardiac Doppler and Color Doppler Indications:    Chest Pain 786.50 / R07.9  History:        Patient has prior history of Echocardiogram examinations, most                 recent 01/14/2020. CAD, Stroke; Risk Factors:Hypertension,                 Dyslipidemia, Diabetes and GERD.  Sonographer:    Bernadene Person RDCS Referring Phys: [address] [place]  1. Left ventricular ejection fraction, by estimation, is 30-35%. The left ventricle has moderately decreased function. The left ventricle demonstrates regional wall motion abnormalities, basal to mid inferior and inferoseptal akinesis, basal to mid inferolateral severe hypokinesis. The left ventricular internal cavity size was mildly dilated. There is mild left ventricular hypertrophy. Left ventricular diastolic parameters are consistent with Grade II diastolic dysfunction (pseudonormalization).  2. Right ventricular systolic function is moderately reduced. The right ventricular size is normal. There is mildly elevated pulmonary artery systolic pressure. The estimated right ventricular systolic pressure is 08.6 mmHg.  3. Left atrial size was moderately dilated.  4. The mitral valve is degenerative. Moderate to severe mitral valve regurgitation. No evidence of mitral stenosis.  5. The aortic valve is tricuspid. Aortic valve regurgitation is mild. Moderate aortic valve stenosis. Aortic valve area, by VTI measures 1.08 cm. Aortic valve mean gradient measures 22.0 mmHg.  6. Aortic dilatation  noted. There is mild dilatation of the ascending aorta measuring 42 mm.  7. The inferior vena cava is dilated in size with >50% respiratory variability, suggesting right atrial pressure of 8 mmHg. FINDINGS  Left Ventricle: Left ventricular ejection fraction, by estimation, is 30 to 35%. The left ventricle has moderately decreased function. The left ventricle demonstrates regional wall motion abnormalities. The left ventricular internal cavity size was mildly dilated. There is mild left ventricular hypertrophy. Left ventricular diastolic parameters are consistent with Grade II diastolic dysfunction (pseudonormalization). Right Ventricle: The right ventricular size is normal. No increase in right ventricular wall thickness. Right ventricular systolic function is moderately reduced. There is mildly elevated pulmonary artery systolic pressure. The tricuspid regurgitant velocity is 2.71 m/s, and with an assumed right atrial pressure of 8 mmHg, the estimated right ventricular systolic pressure is 76.1 mmHg. Left Atrium: Left atrial size was moderately dilated. Right Atrium: Right atrial size was normal in size. Pericardium: There is no evidence of pericardial effusion. Mitral Valve: The mitral valve is degenerative in appearance. There is moderate calcification of the mitral valve leaflet(s). Mild to moderate mitral annular calcification. Moderate to severe mitral valve regurgitation. No evidence of mitral valve stenosis. Tricuspid Valve: The tricuspid valve is normal in structure. Tricuspid valve regurgitation is mild. Aortic Valve: The aortic valve is tricuspid. Aortic valve regurgitation is mild. Moderate aortic stenosis is present. Aortic valve mean gradient measures 22.0 mmHg. Aortic valve peak gradient measures 34.9 mmHg. Aortic valve area, by VTI measures 1.08 cm. Pulmonic Valve: The pulmonic valve was normal in structure. Pulmonic valve regurgitation is not visualized. Aorta: The aortic root is normal in size and  structure and aortic dilatation noted. There is mild dilatation of the ascending aorta measuring 42 mm. Venous: The  inferior vena cava is dilated in size with greater than 50% respiratory variability, suggesting right atrial pressure of 8 mmHg. IAS/Shunts: No atrial level shunt detected by color flow Doppler.  LEFT VENTRICLE PLAX 2D LVIDd:         6.50 cm      Diastology LVIDs:         6.20 cm      LV e' lateral:   6.53 cm/s LV PW:         1.00 cm      LV E/e' lateral: 25.1 LV IVS:        0.90 cm      LV e' medial:    3.16 cm/s LVOT diam:     2.20 cm      LV E/e' medial:  51.9 LV SV:         76 LV SV Index:   35 LVOT Area:     3.80 cm  LV Volumes (MOD) LV vol d, MOD A2C: 114.0 ml LV vol d, MOD A4C: 134.0 ml LV vol s, MOD A2C: 66.5 ml LV vol s, MOD A4C: 110.0 ml LV SV MOD A2C:     47.5 ml LV SV MOD A4C:     134.0 ml LV SV MOD BP:      35.4 ml RIGHT VENTRICLE RV S prime:     6.66 cm/s TAPSE (M-mode): 1.3 cm LEFT ATRIUM             Index       RIGHT ATRIUM           Index LA diam:        5.70 cm 2.67 cm/m  RA Area:     13.40 cm LA Vol (A2C):   60.5 ml 28.31 ml/m RA Volume:   29.50 ml  13.80 ml/m LA Vol (A4C):   91.3 ml 42.72 ml/m LA Biplane Vol: 75.5 ml 35.32 ml/m  AORTIC VALVE AV Area (Vmax):    1.15 cm AV Area (Vmean):   1.19 cm AV Area (VTI):     1.08 cm AV Vmax:           295.33 cm/s AV Vmean:          217.333 cm/s AV VTI:            0.699 m AV Peak Grad:      34.9 mmHg AV Mean Grad:      22.0 mmHg LVOT Vmax:         89.20 cm/s LVOT Vmean:        67.800 cm/s LVOT VTI:          0.199 m LVOT/AV VTI ratio: 0.28  AORTA Ao Root diam: 3.60 cm Ao Asc diam:  4.20 cm MITRAL VALVE                 TRICUSPID VALVE MV Area (PHT): 2.82 cm      TR Peak grad:   29.4 mmHg MV Decel Time: 269 msec      TR Vmax:        271.00 cm/s MR Peak grad:    114.9 mmHg MR Mean grad:    69.0 mmHg   SHUNTS MR Vmax:         536.00 cm/s Systemic VTI:  0.20 m MR Vmean:        391.0 cm/s  Systemic Diam: 2.20 cm MR PISA:         0.57 cm MR  PISA Eff ROA:  4 mm MR PISA Radius:  0.30 cm MV E velocity: 164.00 cm/s MV A velocity: 103.00 cm/s MV E/A ratio:  1.59 Loralie Champagne MD Electronically signed by Loralie Champagne MD Signature Date/Time: 03/22/2020/4:19:53 PM    Final       Flora Lipps, MD  [facility] 03/23/2020

## 2020-03-23 NOTE — Progress Notes (Signed)
**[name] [name]** [name] [name]   Subjective:   Brief history.This is a 64 year old gentleman with a history of CAD status post CABG in 2012, severe aortic stenosis right-sided heart cath 03/21/2020, carotid disease status post left-sided CEA 12/2019, diabetes mellitus type 2, CKD stage IV, anemia, mildly dilated aortic root, hypertension hyperlipidemia, PAD status post extensive lower extremity revascularization with vascular surgery 05/2019.  History of tobacco abuse obesity and psoriasis.  Underwent cardiac catheterization 03/21/2020 three-vessel coronary artery and severe aortic stenosis with multiple tree advanced pulmonary hypertension significant mitral regurgitation transferred to [facility] for possible TAVR plus RCA PCI.  Blood pressure 131/76 pulse 93 temperature 98 O2 sats 94% CPAP.  Sodium 133 potassium 4.8 chloride 97 CO2 23 BUN 85 creatinine 4.68 glucose 218 calcium 8.6  Amlodipine 2.5 mg daily aspirin 81 mg daily BuSpar 20 mg twice daily Calcitrol 0.25 mg Monday Wednesday Friday Tums with meals, Coreg 12.5 mg daily Plavix 75 mg daily Proscar 5 mg daily calcium 10 mg every 8 hours, isosorbide 90 mg daily, prednisone premed, Zoloft 100 mg daily Flomax 0.4 mg daily  Objective:  Vital signs in last 24 hours:  Temp:  [97.4 F (36.3 C)-98 F (36.7 C)] 98 F (36.7 C) (09/01 0303) Pulse Rate:  [80-100] 100 (09/01 0303) Resp:  [11-22] 18 (09/01 0303) BP: (113-145)/(65-100) 131/76 (09/01 0636) SpO2:  [94 %-99 %] 94 % (09/01 0303) Weight:  [106.7 kg] 106.7 kg (09/01 0015)  Weight change: 0.454 kg Filed Weights   03/21/20 2123 03/22/20 0500 03/23/20 0015  Weight: 106.2 kg 106.1 kg 106.7 kg    Intake/Output: I/O last 3 completed shifts: In: 2281.6 [P.O.:1440; I.V.:521.6; Blood:320] Out: 2150 [Urine:2150]   Intake/Output this shift:  No intake/output data recorded. Alert and oriented CVS- RRR JVP not elevated RS-diminished breath sounds left no wheeze  rales ABD- BS present soft non-distended EXT- no edema   Basic Metabolic Panel: Recent Labs  Lab 03/20/20 0620 03/20/20 0919 03/22/20 0654 03/23/20 0524  NA 138  --  134* 133*  K 3.8  --  3.9 4.8  CL 105  --  99 97*  CO2 20*  --  23 23  GLUCOSE 178*  --  210* 218*  BUN 63*  --  82* 85*  CREATININE 4.21* 4.58* 4.47* 4.68*  CALCIUM 7.8*  --  8.5* 8.6*  MG 2.2  --   --   --     Liver Function Tests: No results for input(s): AST, ALT, ALKPHOS, BILITOT, PROT, ALBUMIN in the last 168 hours. No results for input(s): LIPASE, AMYLASE in the last 168 hours. No results for input(s): AMMONIA in the last 168 hours.  CBC: Recent Labs  Lab 03/20/20 0620 03/21/20 0341 03/22/20 0654 03/22/20 1815 03/23/20 0524  WBC 6.6 5.9 8.7  --  8.3  NEUTROABS 4.3  --  6.2  --   --   HGB 8.0* 7.8* 7.1* 8.5* 7.9*  HCT 24.3* 23.9* 22.5* 27.0* 25.1*  MCV 97.6 98.8 101.4*  --  97.7  PLT 125* 125* 135*  --  128*    Cardiac Enzymes: No results for input(s): CKTOTAL, CKMB, CKMBINDEX, TROPONINI in the last 168 hours.  BNP: Invalid input(s): POCBNP  CBG: Recent Labs  Lab 03/22/20 0655 03/22/20 1120 03/22/20 1704 03/22/20 2144 03/23/20 0605  GLUCAP 217* 155* 163* 152* 37*    Microbiology: Results for orders placed or performed during the hospital encounter of 03/20/20  SARS Coronavirus 2 by RT PCR (hospital order, performed in [facility] hospital lab) Nasopharyngeal Nasopharyngeal Swab     Status: None   Collection Time: 03/20/20  7:28 AM   Specimen: Nasopharyngeal Swab  Result Value Ref Range Status   SARS Coronavirus 2 NEGATIVE NEGATIVE Final    Comment: ([name]) SARS-CoV-2 target nucleic acids are NOT DETECTED.  The SARS-CoV-2 RNA is generally detectable in upper and lower respiratory specimens during the acute phase of infection. The lowest concentration of SARS-CoV-2 viral copies this assay can detect is 250 copies / mL. A negative result does not preclude SARS-CoV-2  infection and should not be used as the sole basis for treatment or other patient management decisions.  A negative result may occur with improper specimen collection / handling, submission of specimen other than nasopharyngeal swab, presence of viral mutation(s) within the areas targeted by this assay, and inadequate number of viral copies (<250 copies / mL). A negative result must be combined with clinical observations, patient history, and epidemiological information.  Fact Sheet for Patients:   StrictlyIdeas.no  Fact Sheet for Healthcare Providers: BankingDealers.co.za  This test is not yet approved or  cleared by the [place] FDA and has been authorized for detection and/or diagnosis of SARS-CoV-2 by FDA under an Emergency Use Authorization (EUA).  This EUA will remain in effect (meaning this test can be used) for the duration of the COVID-19 declaration under Section 564(b)(1) of the Act, 21 U.S.C. section 360bbb-3(b)(1), unless the authorization is terminated or revoked sooner.  Performed at [facility], [address] [place], [name] [name] [address]     Coagulation Studies: No results for input(s): LABPROT, INR in the last 72 hours.  Urinalysis: No results for input(s): COLORURINE, LABSPEC, PHURINE, GLUCOSEU, HGBUR, BILIRUBINUR, KETONESUR, PROTEINUR, UROBILINOGEN, NITRITE, LEUKOCYTESUR in the last 72 hours.  Invalid input(s): APPERANCEUR    Imaging: CARDIAC CATHETERIZATION  Result Date: 03/21/2020  Colon Flattery Cx to Prox Cx lesion is 85% stenosed.  Mid Cx to Dist Cx lesion is 100% stenosed.  1st Mrg lesion is 70% stenosed.  Prox LAD to Mid LAD lesion is 90% stenosed.  Ost RCA to Prox RCA lesion is 99% stenosed.  SVG.  The graft exhibits mild diffuse disease.  Left radial artery graft was visualized by angiography.  Origin lesion is 100% stenosed.  LIMA graft was visualized by non-selective angiography and is  normal in caliber.  The graft exhibits no disease.  1.  Significant underlying three-vessel coronary artery disease with patent LIMA to LAD and SVG to OM 3.  Radial graft to RCA is occluded at the ostium with critical stenosis of the ostial native right coronary artery.  This is the likely culprit for recent non-STEMI and current unstable angina. 2.  Severe aortic stenosis with peak gradient of 27.4 mmHg and valve area of 0.87. 3.  Right heart catheterization showed moderately to severely elevated filling pressures with pulmonary wedge pressure of 31 mmHg, moderate pulmonary hypertension at 50/30 mmHg and normal cardiac output at 5.06 with a cardiac index of 2.36.  Prominent V waves on wedge pressure tracing suggestive of significant mitral regurgitation. Recommendations: This is an overall difficult case.  The patient will need to be evaluated for TAVR plus RCA PCI.  Advanced chronic kidney disease and anemia in addition to significant heart failure make timing of this difficult.  The patient will likely need to be initiated on hemodialysis in order to improve his volume status.  He needs blood transfusion to keep his hemoglobin above 9.  Nonetheless, he might not be able  to tolerate large volume shifts due to significant coronary artery disease and aortic stenosis. I will discuss with our structural heart team.  It might be best to transfer the patient to [facility]. Vascular access on this patient is very difficult due to extensive stenting and calcifications of the lower extremities.  The SFA stent on the right side might be extending into the common femoral artery.  Arterial access might be easier via the left femoral artery.   ECHOCARDIOGRAM COMPLETE  Result Date: 03/22/2020    ECHOCARDIOGRAM REPORT   Patient Name:   [name] [name] Date of Exam: 03/22/2020 Medical Rec #:  [id number]        Height:       66.0 in Accession #:    [id number]       Weight:       233.9 lb Date of Birth:  1956/03/13          BSA:          2.137 m Patient Age:    75 years         BP:           113/70 mmHg Patient Gender: M                HR:           79 bpm. Exam Location:  Inpatient Procedure: 2D Echo, Cardiac Doppler and Color Doppler Indications:    Chest Pain 786.50 / R07.9  History:        Patient has prior history of Echocardiogram examinations, most                 recent 01/14/2020. CAD, Stroke; Risk Factors:Hypertension,                 Dyslipidemia, Diabetes and GERD.  Sonographer:    Bernadene Person RDCS Referring Phys: 58 Lykens  1. Left ventricular ejection fraction, by estimation, is 30-35%. The left ventricle has moderately decreased function. The left ventricle demonstrates regional wall motion abnormalities, basal to mid inferior and inferoseptal akinesis, basal to mid inferolateral severe hypokinesis. The left ventricular internal cavity size was mildly dilated. There is mild left ventricular hypertrophy. Left ventricular diastolic parameters are consistent with Grade II diastolic dysfunction (pseudonormalization).  2. Right ventricular systolic function is moderately reduced. The right ventricular size is normal. There is mildly elevated pulmonary artery systolic pressure. The estimated right ventricular systolic pressure is 86.5 mmHg.  3. Left atrial size was moderately dilated.  4. The mitral valve is degenerative. Moderate to severe mitral valve regurgitation. No evidence of mitral stenosis.  5. The aortic valve is tricuspid. Aortic valve regurgitation is mild. Moderate aortic valve stenosis. Aortic valve area, by VTI measures 1.08 cm. Aortic valve mean gradient measures 22.0 mmHg.  6. Aortic dilatation noted. There is mild dilatation of the ascending aorta measuring 42 mm.  7. The inferior vena cava is dilated in size with >50% respiratory variability, suggesting right atrial pressure of 8 mmHg. FINDINGS  Left Ventricle: Left ventricular ejection fraction, by estimation, is 30 to 35%. The  left ventricle has moderately decreased function. The left ventricle demonstrates regional wall motion abnormalities. The left ventricular internal cavity size was mildly dilated. There is mild left ventricular hypertrophy. Left ventricular diastolic parameters are consistent with Grade II diastolic dysfunction (pseudonormalization). Right Ventricle: The right ventricular size is normal. No increase in right ventricular wall thickness. Right ventricular systolic function is moderately reduced. There is  mildly elevated pulmonary artery systolic pressure. The tricuspid regurgitant velocity is 2.71 m/s, and with an assumed right atrial pressure of 8 mmHg, the estimated right ventricular systolic pressure is 55.7 mmHg. Left Atrium: Left atrial size was moderately dilated. Right Atrium: Right atrial size was normal in size. Pericardium: There is no evidence of pericardial effusion. Mitral Valve: The mitral valve is degenerative in appearance. There is moderate calcification of the mitral valve leaflet(s). Mild to moderate mitral annular calcification. Moderate to severe mitral valve regurgitation. No evidence of mitral valve stenosis. Tricuspid Valve: The tricuspid valve is normal in structure. Tricuspid valve regurgitation is mild. Aortic Valve: The aortic valve is tricuspid. Aortic valve regurgitation is mild. Moderate aortic stenosis is present. Aortic valve mean gradient measures 22.0 mmHg. Aortic valve peak gradient measures 34.9 mmHg. Aortic valve area, by VTI measures 1.08 cm. Pulmonic Valve: The pulmonic valve was normal in structure. Pulmonic valve regurgitation is not visualized. Aorta: The aortic root is normal in size and structure and aortic dilatation noted. There is mild dilatation of the ascending aorta measuring 42 mm. Venous: The inferior vena cava is dilated in size with greater than 50% respiratory variability, suggesting right atrial pressure of 8 mmHg. IAS/Shunts: No atrial level shunt detected by  color flow Doppler.  LEFT VENTRICLE PLAX 2D LVIDd:         6.50 cm      Diastology LVIDs:         6.20 cm      LV e' lateral:   6.53 cm/s LV PW:         1.00 cm      LV E/e' lateral: 25.1 LV IVS:        0.90 cm      LV e' medial:    3.16 cm/s LVOT diam:     2.20 cm      LV E/e' medial:  51.9 LV SV:         76 LV SV Index:   35 LVOT Area:     3.80 cm  LV Volumes (MOD) LV vol d, MOD A2C: 114.0 ml LV vol d, MOD A4C: 134.0 ml LV vol s, MOD A2C: 66.5 ml LV vol s, MOD A4C: 110.0 ml LV SV MOD A2C:     47.5 ml LV SV MOD A4C:     134.0 ml LV SV MOD BP:      35.4 ml RIGHT VENTRICLE RV S prime:     6.66 cm/s TAPSE (M-mode): 1.3 cm LEFT ATRIUM             Index       RIGHT ATRIUM           Index LA diam:        5.70 cm 2.67 cm/m  RA Area:     13.40 cm LA Vol (A2C):   60.5 ml 28.31 ml/m RA Volume:   29.50 ml  13.80 ml/m LA Vol (A4C):   91.3 ml 42.72 ml/m LA Biplane Vol: 75.5 ml 35.32 ml/m  AORTIC VALVE AV Area (Vmax):    1.15 cm AV Area (Vmean):   1.19 cm AV Area (VTI):     1.08 cm AV Vmax:           295.33 cm/s AV Vmean:          217.333 cm/s AV VTI:            0.699 m AV Peak Grad:      34.9 mmHg AV  Mean Grad:      22.0 mmHg LVOT Vmax:         89.20 cm/s LVOT Vmean:        67.800 cm/s LVOT VTI:          0.199 m LVOT/AV VTI ratio: 0.28  AORTA Ao Root diam: 3.60 cm Ao Asc diam:  4.20 cm MITRAL VALVE                 TRICUSPID VALVE MV Area (PHT): 2.82 cm      TR Peak grad:   29.4 mmHg MV Decel Time: 269 msec      TR Vmax:        271.00 cm/s MR Peak grad:    114.9 mmHg MR Mean grad:    69.0 mmHg   SHUNTS MR Vmax:         536.00 cm/s Systemic VTI:  0.20 m MR Vmean:        391.0 cm/s  Systemic Diam: 2.20 cm MR PISA:         0.57 cm MR PISA Eff ROA: 4 mm MR PISA Radius:  0.30 cm MV E velocity: 164.00 cm/s MV A velocity: 103.00 cm/s MV E/A ratio:  1.59 Loralie Champagne MD Electronically signed by Loralie Champagne MD Signature Date/Time: 03/22/2020/4:19:53 PM    Final      Medications:   . sodium chloride    . sodium  chloride    . sodium chloride 10 mL/hr at 03/23/20 0643  . heparin 1,450 Units/hr (03/23/20 0336)  . nitroGLYCERIN 30 mcg/min (03/23/20 0021)   . amLODipine  2.5 mg Oral Daily  . aspirin EC  81 mg Oral Daily  . busPIRone  20 mg Oral BID  . calcitRIOL  0.25 mcg Oral Q M,W,F  . calcium carbonate  250 mg Oral TID  . carvedilol  12.5 mg Oral Daily  . clopidogrel  75 mg Oral Daily  . diphenhydrAMINE  50 mg Oral Once  . finasteride  5 mg Oral Daily  . hydrALAZINE  10 mg Oral Q8H  . insulin aspart  0-5 Units Subcutaneous QHS  . insulin aspart  0-9 Units Subcutaneous TID WC  . isosorbide mononitrate  90 mg Oral Daily  . predniSONE  50 mg Oral Q6H  . sertraline  100 mg Oral QHS  . sodium chloride flush  3 mL Intravenous Q12H  . sodium chloride flush  3 mL Intravenous Q12H  . tamsulosin  0.4 mg Oral Daily   sodium chloride, sodium chloride, acetaminophen, albuterol, alum & mag hydroxide-simeth, cyclobenzaprine, hydroxypropyl methylcellulose / hypromellose, nitroGLYCERIN, ondansetron (ZOFRAN) IV, sodium chloride flush, sodium chloride flush  Assessment/ Plan:   Chronic renal insufficiency stage IV/V.  High risk of contrast associated nephropathy.  Last cardiac catheterization 03/21/2020.  Losartan being held.  Etiology of renal insufficiency appears to be secondary to diabetic nephropathy followed by nephrologist Dr. [name].  Hypertension/volume appears adequately controlled on carvedilol, amlodipine and tamsulosin.  Home dose of torsemide being held.  Currently uses 40 mg daily will restart at this point.  Secondary hyperparathyroidism PTH 118 03/10/2020 currently using calcitriol  Anemia status post transfusion 1 unit packed red blood cells.  May benefit from ESA  Coronary artery disease.  Patient scheduled for percutaneous intervention of right coronary artery  Aortic stenosis work-up for TAVR procedure.  Diabetes mellitus as per primary service   LOS: 2 Sherril Croon @TODAY @7 :02  AM

## 2020-03-24 ENCOUNTER — Other Ambulatory Visit: Payer: Medicare Other

## 2020-03-24 ENCOUNTER — Encounter: Payer: Medicare Other | Admitting: Hematology and Oncology

## 2020-03-24 ENCOUNTER — Encounter (HOSPITAL_COMMUNITY): Payer: Self-pay | Admitting: Cardiovascular Disease

## 2020-03-24 DIAGNOSIS — D500 Iron deficiency anemia secondary to blood loss (chronic): Secondary | ICD-10-CM

## 2020-03-24 DIAGNOSIS — E1122 Type 2 diabetes mellitus with diabetic chronic kidney disease: Secondary | ICD-10-CM

## 2020-03-24 DIAGNOSIS — G4733 Obstructive sleep apnea (adult) (pediatric): Secondary | ICD-10-CM

## 2020-03-24 LAB — TYPE AND SCREEN
ABO/RH(D): AB POS
Antibody Screen: NEGATIVE
Unit division: 0
Unit division: 0

## 2020-03-24 LAB — BPAM RBC
Blood Product Expiration Date: 202109092359
Blood Product Expiration Date: 202109302359
ISSUE DATE / TIME: 202108311236
ISSUE DATE / TIME: 202109011130
Unit Type and Rh: 8400
Unit Type and Rh: 8400

## 2020-03-24 LAB — GLUCOSE, CAPILLARY
Glucose-Capillary: 173 mg/dL — ABNORMAL HIGH (ref 70–99)
Glucose-Capillary: 182 mg/dL — ABNORMAL HIGH (ref 70–99)
Glucose-Capillary: 183 mg/dL — ABNORMAL HIGH (ref 70–99)
Glucose-Capillary: 188 mg/dL — ABNORMAL HIGH (ref 70–99)
Glucose-Capillary: 202 mg/dL — ABNORMAL HIGH (ref 70–99)
Glucose-Capillary: 207 mg/dL — ABNORMAL HIGH (ref 70–99)

## 2020-03-24 LAB — COMPREHENSIVE METABOLIC PANEL
ALT: 18 U/L (ref 0–44)
AST: 20 U/L (ref 15–41)
Albumin: 3.8 g/dL (ref 3.5–5.0)
Alkaline Phosphatase: 72 U/L (ref 38–126)
Anion gap: 13 (ref 5–15)
BUN: 92 mg/dL — ABNORMAL HIGH (ref 8–23)
CO2: 25 mmol/L (ref 22–32)
Calcium: 9.1 mg/dL (ref 8.9–10.3)
Chloride: 96 mmol/L — ABNORMAL LOW (ref 98–111)
Creatinine, Ser: 4.77 mg/dL — ABNORMAL HIGH (ref 0.61–1.24)
GFR calc Af Amer: 14 mL/min — ABNORMAL LOW (ref >60)
GFR calc non Af Amer: 12 mL/min — ABNORMAL LOW (ref >60)
Glucose, Bld: 196 mg/dL — ABNORMAL HIGH (ref 70–99)
Potassium: 4.5 mmol/L (ref 3.5–5.1)
Sodium: 134 mmol/L — ABNORMAL LOW (ref 135–145)
Total Bilirubin: 0.5 mg/dL (ref 0.3–1.2)
Total Protein: 7.7 g/dL (ref 6.5–8.1)

## 2020-03-24 LAB — CBC
HCT: 30.2 % — ABNORMAL LOW (ref 39.0–52.0)
Hemoglobin: 9.5 g/dL — ABNORMAL LOW (ref 13.0–17.0)
MCH: 30.1 pg (ref 26.0–34.0)
MCHC: 31.5 g/dL (ref 30.0–36.0)
MCV: 95.6 fL (ref 80.0–100.0)
Platelets: 159 10*3/uL (ref 150–400)
RBC: 3.16 MIL/uL — ABNORMAL LOW (ref 4.22–5.81)
RDW: 17.1 % — ABNORMAL HIGH (ref 11.5–15.5)
WBC: 10.9 10*3/uL — ABNORMAL HIGH (ref 4.0–10.5)
nRBC: 0 % (ref 0.0–0.2)

## 2020-03-24 LAB — PHOSPHORUS: Phosphorus: 5 mg/dL — ABNORMAL HIGH (ref 2.5–4.6)

## 2020-03-24 LAB — FERRITIN: Ferritin: 45 ng/mL (ref 24–336)

## 2020-03-24 LAB — MAGNESIUM: Magnesium: 2.7 mg/dL — ABNORMAL HIGH (ref 1.7–2.4)

## 2020-03-24 MED ORDER — SODIUM CHLORIDE 0.9 % IV SOLN
510.0000 mg | Freq: Once | INTRAVENOUS | Status: AC
  Administered 2020-03-24: 510 mg via INTRAVENOUS
  Filled 2020-03-24: qty 17

## 2020-03-24 MED ORDER — DARBEPOETIN ALFA 25 MCG/0.42ML IJ SOSY
25.0000 ug | PREFILLED_SYRINGE | INTRAMUSCULAR | Status: DC
  Filled 2020-03-24: qty 0.42

## 2020-03-24 MED ORDER — INSULIN ASPART 100 UNIT/ML ~~LOC~~ SOLN
0.0000 [IU] | Freq: Three times a day (TID) | SUBCUTANEOUS | Status: DC
  Administered 2020-03-24: 2 [IU] via SUBCUTANEOUS
  Administered 2020-03-24: 3 [IU] via SUBCUTANEOUS
  Administered 2020-03-24: 2 [IU] via SUBCUTANEOUS
  Administered 2020-03-25: 1 [IU] via SUBCUTANEOUS
  Administered 2020-03-25: 5 [IU] via SUBCUTANEOUS

## 2020-03-24 MED ORDER — FUROSEMIDE 10 MG/ML IJ SOLN: 160.0000 mg | Freq: Once | INTRAVENOUS | Status: DC

## 2020-03-24 MED ORDER — FUROSEMIDE 10 MG/ML IJ SOLN
160.0000 mg | Freq: Two times a day (BID) | INTRAVENOUS | Status: DC
  Administered 2020-03-24 (×2): 160 mg via INTRAVENOUS
  Filled 2020-03-24 (×2): qty 10
  Filled 2020-03-24 (×3): qty 16

## 2020-03-24 NOTE — Progress Notes (Addendum)
PROGRESS NOTE  [name] [name] ZJQ:[id number] DOB: [birth year] DOA: 03/21/2020 PCP: [name], MD   LOS: 3 days   Brief narrative: As per HPI,  [name] [name] is a 64 y.o. male with history of CAD s/p CABG in 2012, severe aortic stenosis by RHC 03/21/2020, carotid disease s/p left-sided CEA with recent 12/2019 , DM2, CKD stage IV, anemia of chronic disease, mildly dilated aortic root, hypertension, hyperlipidemia, PAD s/p extensive lower extremity revascularization by vascular surgery 05/2019, tobacco use though reportedly recently quit, obesity, and psoriasis presented to the hospital on 03/19/2020 with chest pain and was brought into the hospital.  Patient was started on heparin and nitro infusion.  On 03/21/2020, patient underwent cardiac catheterization with three-vessel coronary artery disease with severe aortic stenosis and moderate pulmonary hypertension/significant mitral regurgitation.  Patient was then transferred from [facility] to [facility] for possible TAVR plus RCA PCI.   Medical team was consulted for management of diabetes.  Assessment/Plan:  Principal Problem:   Non-ST elevation (NSTEMI) myocardial infarction [facility]) Active Problems:   3-vessel CAD   Hyperlipidemia, mixed   Essential hypertension   Obstructive sleep apnea   Carotid artery disease (HCC)   Severe aortic stenosis   Obesity   PVD (peripheral vascular disease) (HCC)   DM (diabetes mellitus), type 2 with complications ([place])   CKD stage 4 due to type 2 diabetes mellitus (HCC)   Acute diastolic CHF (congestive heart failure) (HCC)   Chronic heart failure with preserved ejection fraction (HFpEF) (HCC)   Hx of CABG   Anemia   CAD (coronary artery disease)   Diabetes mellitus type 2 with CKD stage IV.  Continue sliding scale insulin, Accu-Cheks, diabetic diet.  Closely monitor.  Latest hemoglobin A1c 1 month ago was 5.7. Hemoglobin A1c 1 year ago at 6.1.  onsliding scale insulin.  Changed to [facility] and  bedtime.  Latest POC glucose of 173.  Unstable angina with severe aortic stenosis/mitral regurgitation status post cardiac cath with significant multivessel three-vessel coronary artery disease. Status post drug-eluting stent to RCA on 03/24/2019.  Patient will likely need TAVR at some point but due to anemia and renal function surgery has been a concern..  Patient has been taken off heparin and nitro drip at this time.  Further management as per cardiology.  on aspirin, Plavix, Coreg, IV Lasix hydralazine  Ischemic cardiomyopathy, volume overload, systolic congestive heart failure, pulmonary hypertension.  Has been started on IV Lasix as per cardiology.  2D echocardiogram with LV function of 30 to 35%.  Chronic kidney disease stage IV.   Seen by nephrology and there is no plan for dialysis at this time.  Patient has high risk of contrast-induced nephropathy.  Losartan on hold.  On high-dose IV Lasix at this time.  Anemia of chronic kidney disease, possible chronic GI blood loss.  Monitor CBC.  Baseline hemoglobin of around 10.  Transfuse to keep more than 9.  Patient was FOBT positive.  Patient has been seen by GI. History of recurrent adenomatous polyp.  Status post 2 unit of packed RBC transfusion with hemoglobin of 9.5 today..  GI has been consulted for possible need of endoscopic evaluation but plan to proceed with endoscopy in 3 to 6 months.  GI plans to follow-up in the office in 6 to 8 weeks.  Essential hypertension.  On hydralazine, Coreg,.  Latest blood pressure 139/72  Sleep apnea.  Continue nocturnal CPAP  DVT prophylaxis: Off heparin drip, add subcu heparin.  Code Status:  Full code  Procedures: Diagnostic cardiac catheterization  03/21/2020 Cardiac catheterization with placement of drug-eluting stent to RCA on 03/23/2020  Antibiotics:  . None  Anti-infectives (From admission, onward)   None      Subjective: Today, patient was seen and examined at bedside.  Patient feels  better.  Denies any dizziness, lightheadedness, chest pain, shortness of breath nausea vomiting.  Objective: Vitals:   03/24/20 0425 03/24/20 0854  BP: (!) 144/82 125/73  Pulse: 86 85  Resp: 20 16  Temp: 97.6 F (36.4 C) (!) 97.5 F (36.4 C)  SpO2: 94% 95%    Intake/Output Summary (Last 24 hours) at 03/24/2020 1046 Last data filed at 03/24/2020 0856 Gross per 24 hour  Intake 360 ml  Output --  Net 360 ml   Filed Weights   03/22/20 0500 03/23/20 0015 03/24/20 0425  Weight: 106.1 kg 106.7 kg 107.2 kg   Body mass index is 38.14 kg/m.   Physical Exam: GENERAL: Patient is alert awake and oriented. Not in obvious distress.   Obese,  HENT: No scleral pallor or icterus. Pupils equally reactive to light. Oral mucosa is moist NECK: is supple, no gross swelling noted. CHEST: Diminished breath sounds bilaterally. CVS: S1 and S2 heard, no murmur. Regular rate and rhythm.  ABDOMEN: Soft, non-tender, bowel sounds are present. EXTREMITIES: Bilateral lower extremity edema.  Right radial cath site with dressing. CNS: Cranial nerves are intact. No focal motor deficits. SKIN: warm and dry without rashes.  Data Review: I have personally reviewed the following laboratory data and studies,  CBC: Recent Labs  Lab 03/20/20 0620 03/20/20 0620 03/21/20 0341 03/21/20 0341 03/22/20 0654 03/22/20 1815 03/23/20 0524 03/23/20 0728 03/24/20 0357  WBC 6.6  --  5.9  --  8.7  --  8.3  --  10.9*  NEUTROABS 4.3  --   --   --  6.2  --   --   --   --   HGB 8.0*   < > 7.8*   < > 7.1* 8.5* 7.9* 7.9* 9.5*  HCT 24.3*   < > 23.9*   < > 22.5* 27.0* 25.1* 25.1* 30.2*  MCV 97.6  --  98.8  --  101.4*  --  97.7  --  95.6  PLT 125*  --  125*  --  135*  --  128*  --  159   < > = values in this interval not displayed.   Basic Metabolic Panel: Recent Labs  Lab 03/20/20 0620 03/20/20 0919 03/22/20 0654 03/23/20 0524 03/24/20 0357  NA 138  --  134* 133* 134*  K 3.8  --  3.9 4.8 4.5  CL 105  --  99 97* 96*   CO2 20*  --  23 23 25   GLUCOSE 178*  --  210* 218* 196*  BUN 63*  --  82* 85* 92*  CREATININE 4.21* 4.58* 4.47* 4.68* 4.77*  CALCIUM 7.8*  --  8.5* 8.6* 9.1  MG 2.2  --   --   --  2.7*  PHOS  --   --   --   --  5.0*   Liver Function Tests: Recent Labs  Lab 03/24/20 0357  AST 20  ALT 18  ALKPHOS 72  BILITOT 0.5  PROT 7.7  ALBUMIN 3.8   No results for input(s): LIPASE, AMYLASE in the last 168 hours. No results for input(s): AMMONIA in the last 168 hours. Cardiac Enzymes: No results for input(s): CKTOTAL, CKMB, CKMBINDEX, TROPONINI in the last  168 hours. BNP (last 3 results) Recent Labs    02/13/20 0033 03/20/20 0620  BNP 713.3* 503.2*    ProBNP (last 3 results) No results for input(s): PROBNP in the last 8760 hours.  CBG: Recent Labs  Lab 03/23/20 1122 03/23/20 2034 03/24/20 0020 03/24/20 0422 03/24/20 0805  GLUCAP 210* 301* 202* 183* 173*   Recent Results (from the past 240 hour(s))  SARS CORONAVIRUS 2 (TAT 6-24 HRS) Nasopharyngeal Nasopharyngeal Swab     Status: None   Collection Time: 03/14/20  3:40 PM   Specimen: Nasopharyngeal Swab  Result Value Ref Range Status   SARS Coronavirus 2 NEGATIVE NEGATIVE Final    Comment: (NOTE) SARS-CoV-2 target nucleic acids are NOT DETECTED.  The SARS-CoV-2 RNA is generally detectable in upper and lower respiratory specimens during the acute phase of infection. Negative results do not preclude SARS-CoV-2 infection, do not rule out co-infections with other pathogens, and should not be used as the sole basis for treatment or other patient management decisions. Negative results must be combined with clinical observations, patient history, and epidemiological information. The expected result is Negative.  Fact Sheet for Patients: SugarRoll.be  Fact Sheet for Healthcare Providers: https://www.woods-mathews.com/  This test is not yet approved or cleared by the [place] FDA  and  has been authorized for detection and/or diagnosis of SARS-CoV-2 by FDA under an Emergency Use Authorization (EUA). This EUA will remain  in effect (meaning this test can be used) for the duration of the COVID-19 declaration under Se ction 564(b)(1) of the Act, 21 U.S.C. section 360bbb-3(b)(1), unless the authorization is terminated or revoked sooner.  Performed at [facility], [place] [address], [place], [place] [address]   SARS Coronavirus 2 by RT PCR (hospital order, performed in [facility] hospital lab) Nasopharyngeal Nasopharyngeal Swab     Status: None   Collection Time: 03/20/20  7:28 AM   Specimen: Nasopharyngeal Swab  Result Value Ref Range Status   SARS Coronavirus 2 NEGATIVE NEGATIVE Final    Comment: (NOTE) SARS-CoV-2 target nucleic acids are NOT DETECTED.  The SARS-CoV-2 RNA is generally detectable in upper and lower respiratory specimens during the acute phase of infection. The lowest concentration of SARS-CoV-2 viral copies this assay can detect is 250 copies / mL. A negative result does not preclude SARS-CoV-2 infection and should not be used as the sole basis for treatment or other patient management decisions.  A negative result may occur with improper specimen collection / handling, submission of specimen other than nasopharyngeal swab, presence of viral mutation(s) within the areas targeted by this assay, and inadequate number of viral copies (<250 copies / mL). A negative result must be combined with clinical observations, patient history, and epidemiological information.  Fact Sheet for Patients:   StrictlyIdeas.no  Fact Sheet for Healthcare Providers: BankingDealers.co.za  This test is not yet approved or  cleared by the [place] FDA and has been authorized for detection and/or diagnosis of SARS-CoV-2 by FDA under an Emergency Use Authorization (EUA).  This EUA will remain in effect (meaning  this test can be used) for the duration of the COVID-19 declaration under Section 564(b)(1) of the Act, 21 U.S.C. section 360bbb-3(b)(1), unless the authorization is terminated or revoked sooner.  Performed at [facility], [address], [place], [place] [address]      Studies: CARDIAC CATHETERIZATION  Result Date: 03/23/2020  Ost RCA to Prox RCA lesion is 99% stenosed.  Post intervention, there is a 0% residual stenosis.  A drug-eluting stent was successfully placed using a STENT RESOLUTE ONYX 4.0X18.  Successful angioplasty and drug-eluting stent placement to the ostial right coronary artery.  60 mL of contrast was used. Recommendations: Continue dual antiplatelet therapy for at least 6 months. The patient can undergo endoscopic GI procedures from a cardiac standpoint at an overall moderate risk.  Although his aortic stenosis is severe, it does not seem to be critical. Maintain a hemoglobin above 8. The patient will likely require starting dialysis very soon in order to facilitate continued evaluation for TAVR. The patient is still volume overloaded and his LVEDP was 31 mmHg.  I changed torsemide to 40 mg twice daily. I discontinued heparin drip and nitroglycerin drip.   ECHOCARDIOGRAM COMPLETE  Result Date: 03/22/2020    ECHOCARDIOGRAM REPORT   Patient Name:   JULUIS FITZSIMMONS Date of Exam: 03/22/2020 Medical Rec #:  [id number]        Height:       66.0 in Accession #:    [id number]       Weight:       233.9 lb Date of Birth:  [birth year]         BSA:          2.137 m Patient Age:    60 years         BP:           113/70 mmHg Patient Gender: M                HR:           79 bpm. Exam Location:  Inpatient Procedure: 2D Echo, Cardiac Doppler and Color Doppler Indications:    Chest Pain 786.50 / R07.9  History:        Patient has prior history of Echocardiogram examinations, most                 recent 01/14/2020. CAD, Stroke; Risk Factors:Hypertension,                 Dyslipidemia,  Diabetes and GERD.  Sonographer:    Bernadene Person RDCS Referring Phys: 57 [place]  1. Left ventricular ejection fraction, by estimation, is 30-35%. The left ventricle has moderately decreased function. The left ventricle demonstrates regional wall motion abnormalities, basal to mid inferior and inferoseptal akinesis, basal to mid inferolateral severe hypokinesis. The left ventricular internal cavity size was mildly dilated. There is mild left ventricular hypertrophy. Left ventricular diastolic parameters are consistent with Grade II diastolic dysfunction (pseudonormalization).  2. Right ventricular systolic function is moderately reduced. The right ventricular size is normal. There is mildly elevated pulmonary artery systolic pressure. The estimated right ventricular systolic pressure is 86.5 mmHg.  3. Left atrial size was moderately dilated.  4. The mitral valve is degenerative. Moderate to severe mitral valve regurgitation. No evidence of mitral stenosis.  5. The aortic valve is tricuspid. Aortic valve regurgitation is mild. Moderate aortic valve stenosis. Aortic valve area, by VTI measures 1.08 cm. Aortic valve mean gradient measures 22.0 mmHg.  6. Aortic dilatation noted. There is mild dilatation of the ascending aorta measuring 42 mm.  7. The inferior vena cava is dilated in size with >50% respiratory variability, suggesting right atrial pressure of 8 mmHg. FINDINGS  Left Ventricle: Left ventricular ejection fraction, by estimation, is 30 to 35%. The left ventricle has moderately decreased function. The left ventricle demonstrates regional wall motion abnormalities. The left ventricular internal cavity size was mildly dilated. There  is mild left ventricular hypertrophy. Left ventricular diastolic parameters are consistent with Grade II diastolic dysfunction (pseudonormalization). Right Ventricle: The right ventricular size is normal. No increase in right ventricular wall thickness.  Right ventricular systolic function is moderately reduced. There is mildly elevated pulmonary artery systolic pressure. The tricuspid regurgitant velocity is 2.71 m/s, and with an assumed right atrial pressure of 8 mmHg, the estimated right ventricular systolic pressure is 94.7 mmHg. Left Atrium: Left atrial size was moderately dilated. Right Atrium: Right atrial size was normal in size. Pericardium: There is no evidence of pericardial effusion. Mitral Valve: The mitral valve is degenerative in appearance. There is moderate calcification of the mitral valve leaflet(s). Mild to moderate mitral annular calcification. Moderate to severe mitral valve regurgitation. No evidence of mitral valve stenosis. Tricuspid Valve: The tricuspid valve is normal in structure. Tricuspid valve regurgitation is mild. Aortic Valve: The aortic valve is tricuspid. Aortic valve regurgitation is mild. Moderate aortic stenosis is present. Aortic valve mean gradient measures 22.0 mmHg. Aortic valve peak gradient measures 34.9 mmHg. Aortic valve area, by VTI measures 1.08 cm. Pulmonic Valve: The pulmonic valve was normal in structure. Pulmonic valve regurgitation is not visualized. Aorta: The aortic root is normal in size and structure and aortic dilatation noted. There is mild dilatation of the ascending aorta measuring 42 mm. Venous: The inferior vena cava is dilated in size with greater than 50% respiratory variability, suggesting right atrial pressure of 8 mmHg. IAS/Shunts: No atrial level shunt detected by color flow Doppler.  LEFT VENTRICLE PLAX 2D LVIDd:         6.50 cm      Diastology LVIDs:         6.20 cm      LV e' lateral:   6.53 cm/s LV PW:         1.00 cm      LV E/e' lateral: 25.1 LV IVS:        0.90 cm      LV e' medial:    3.16 cm/s LVOT diam:     2.20 cm      LV E/e' medial:  51.9 LV SV:         76 LV SV Index:   35 LVOT Area:     3.80 cm  LV Volumes (MOD) LV vol d, MOD A2C: 114.0 ml LV vol d, MOD A4C: 134.0 ml LV vol s, MOD  A2C: 66.5 ml LV vol s, MOD A4C: 110.0 ml LV SV MOD A2C:     47.5 ml LV SV MOD A4C:     134.0 ml LV SV MOD BP:      35.4 ml RIGHT VENTRICLE RV S prime:     6.66 cm/s TAPSE (M-mode): 1.3 cm LEFT ATRIUM             Index       RIGHT ATRIUM           Index LA diam:        5.70 cm 2.67 cm/m  RA Area:     13.40 cm LA Vol (A2C):   60.5 ml 28.31 ml/m RA Volume:   29.50 ml  13.80 ml/m LA Vol (A4C):   91.3 ml 42.72 ml/m LA Biplane Vol: 75.5 ml 35.32 ml/m  AORTIC VALVE AV Area (Vmax):    1.15 cm AV Area (Vmean):   1.19 cm AV Area (VTI):     1.08 cm AV Vmax:  295.33 cm/s AV Vmean:          217.333 cm/s AV VTI:            0.699 m AV Peak Grad:      34.9 mmHg AV Mean Grad:      22.0 mmHg LVOT Vmax:         89.20 cm/s LVOT Vmean:        67.800 cm/s LVOT VTI:          0.199 m LVOT/AV VTI ratio: 0.28  AORTA Ao Root diam: 3.60 cm Ao Asc diam:  4.20 cm MITRAL VALVE                 TRICUSPID VALVE MV Area (PHT): 2.82 cm      TR Peak grad:   29.4 mmHg MV Decel Time: 269 msec      TR Vmax:        271.00 cm/s MR Peak grad:    114.9 mmHg MR Mean grad:    69.0 mmHg   SHUNTS MR Vmax:         536.00 cm/s Systemic VTI:  0.20 m MR Vmean:        391.0 cm/s  Systemic Diam: 2.20 cm MR PISA:         0.57 cm MR PISA Eff ROA: 4 mm MR PISA Radius:  0.30 cm MV E velocity: 164.00 cm/s MV A velocity: 103.00 cm/s MV E/A ratio:  1.59 Loralie Champagne MD Electronically signed by Loralie Champagne MD Signature Date/Time: 03/22/2020/4:19:53 PM    Final       Flora Lipps, MD  [facility] 03/24/2020

## 2020-03-24 NOTE — Progress Notes (Signed)
[name] ROUNDING NOTE   Subjective:   Brief history.This is a 64 year old gentleman with a history of CAD status post CABG in 2012, severe aortic stenosis right-sided heart cath 03/21/2020, carotid disease status post left-sided CEA 12/2019, diabetes mellitus type 2, CKD stage IV, anemia, mildly dilated aortic root, hypertension hyperlipidemia, PAD status post extensive lower extremity revascularization with vascular surgery 05/2019.  History of tobacco abuse obesity and psoriasis.  Underwent cardiac catheterization 03/21/2020 three-vessel coronary artery and severe aortic stenosis with multiple tree advanced pulmonary hypertension significant mitral regurgitation transferred to [facility] for possible TAVR plus RCA PCI.  Blood pressure 144/82 pulse 86 temperature 97.6 O2 sats 94% room air  Sodium 134 potassium 4.5 chloride 96 CO2 25 BUN 92 creatinine 4.77 glucose 196 calcium 9.1 phosphorus 5.0 magnesium 2.7 albumin 3.8 hemoglobin 9.5  Amlodipine 2.5 mg daily aspirin 81 mg daily BuSpar 20 mg twice daily Calcitrol 0.25 mg Monday Wednesday Friday Tums with meals, Coreg 12.5 mg daily Plavix 75 mg daily Proscar 5 mg daily , isosorbide 90 mg daily, prednisone premed, Zoloft 100 mg daily Flomax 0.4 mg daily, hydralazine 25 mg every 8 hours  IV Lasix 160 mg twice daily  Objective:  Vital signs in last 24 hours:  Temp:  [97.3 F (36.3 C)-98.2 F (36.8 C)] 97.6 F (36.4 C) (09/02 0425) Pulse Rate:  [84-94] 86 (09/02 0425) Resp:  [13-27] 20 (09/02 0425) BP: (124-174)/(64-115) 144/82 (09/02 0425) SpO2:  [92 %-100 %] 94 % (09/02 0425) Weight:  [107.2 kg] 107.2 kg (09/02 0425)  Weight change: 0.499 kg Filed Weights   03/22/20 0500 03/23/20 0015 03/24/20 0425  Weight: 106.1 kg 106.7 kg 107.2 kg    Intake/Output: I/O last 3 completed shifts: In: 1726.1 [P.O.:960; I.V.:446.1; Blood:320] Out: 3710 [Urine:1450]   Intake/Output this shift:  No intake/output data  recorded. Alert and oriented CVS- RRR JVP not elevated RS-diminished breath sounds left no wheeze rales ABD- BS present soft non-distended EXT- no edema   Basic Metabolic Panel: Recent Labs  Lab 03/20/20 0620 03/20/20 0620 03/20/20 0919 03/22/20 0654 03/23/20 0524 03/24/20 0357  NA 138  --   --  134* 133* 134*  K 3.8  --   --  3.9 4.8 4.5  CL 105  --   --  99 97* 96*  CO2 20*  --   --  23 23 25   GLUCOSE 178*  --   --  210* 218* 196*  BUN 63*  --   --  82* 85* 92*  CREATININE 4.21*  --  4.58* 4.47* 4.68* 4.77*  CALCIUM 7.8*   < >  --  8.5* 8.6* 9.1  MG 2.2  --   --   --   --  2.7*  PHOS  --   --   --   --   --  5.0*   < > = values in this interval not displayed.    Liver Function Tests: Recent Labs  Lab 03/24/20 0357  AST 20  ALT 18  ALKPHOS 72  BILITOT 0.5  PROT 7.7  ALBUMIN 3.8   No results for input(s): LIPASE, AMYLASE in the last 168 hours. No results for input(s): AMMONIA in the last 168 hours.  CBC: Recent Labs  Lab 03/20/20 0620 03/20/20 0620 03/21/20 0341 03/21/20 0341 03/22/20 0654 03/22/20 1815 03/23/20 0524 03/23/20 0728 03/24/20 0357  WBC 6.6  --  5.9  --  8.7  --  8.3  --  10.9*  NEUTROABS 4.3  --   --   --  6.2  --   --   --   --   HGB 8.0*   < > 7.8*   < > 7.1* 8.5* 7.9* 7.9* 9.5*  HCT 24.3*   < > 23.9*   < > 22.5* 27.0* 25.1* 25.1* 30.2*  MCV 97.6  --  98.8  --  101.4*  --  97.7  --  95.6  PLT 125*  --  125*  --  135*  --  128*  --  159   < > = values in this interval not displayed.    Cardiac Enzymes: No results for input(s): CKTOTAL, CKMB, CKMBINDEX, TROPONINI in the last 168 hours.  BNP: Invalid input(s): POCBNP  CBG: Recent Labs  Lab 03/23/20 0605 03/23/20 1122 03/23/20 2034 03/24/20 0020 03/24/20 0422  GLUCAP 211* 210* 301* 202* 183*    Microbiology: Results for orders placed or performed during the hospital encounter of 03/20/20  SARS Coronavirus 2 by RT PCR (hospital order, performed in [facility] hospital lab)  Nasopharyngeal Nasopharyngeal Swab     Status: None   Collection Time: 03/20/20  7:28 AM   Specimen: Nasopharyngeal Swab  Result Value Ref Range Status   SARS Coronavirus 2 NEGATIVE NEGATIVE Final    Comment: (NOTE) SARS-CoV-2 target nucleic acids are NOT DETECTED.  The SARS-CoV-2 RNA is generally detectable in upper and lower respiratory specimens during the acute phase of infection. The lowest concentration of SARS-CoV-2 viral copies this assay can detect is 250 copies / mL. A negative result does not preclude SARS-CoV-2 infection and should not be used as the sole basis for treatment or other patient management decisions.  A negative result may occur with improper specimen collection / handling, submission of specimen other than nasopharyngeal swab, presence of viral mutation(s) within the areas targeted by this assay, and inadequate number of viral copies (<250 copies / mL). A negative result must be combined with clinical observations, patient history, and epidemiological information.  Fact Sheet for Patients:   StrictlyIdeas.no  Fact Sheet for Healthcare Providers: BankingDealers.co.za  This test is not yet approved or  cleared by the [place] FDA and has been authorized for detection and/or diagnosis of SARS-CoV-2 by FDA under an Emergency Use Authorization (EUA).  This EUA will remain in effect (meaning this test can be used) for the duration of the COVID-19 declaration under Section 564(b)(1) of the Act, 21 U.S.C. section 360bbb-3(b)(1), unless the authorization is terminated or revoked sooner.  Performed at [facility], [address] [place], [place] [address]     Coagulation Studies: No results for input(s): LABPROT, INR in the last 72 hours.  Urinalysis: No results for input(s): COLORURINE, LABSPEC, PHURINE, GLUCOSEU, HGBUR, BILIRUBINUR, KETONESUR, PROTEINUR, UROBILINOGEN, NITRITE, LEUKOCYTESUR in  the last 72 hours.  Invalid input(s): APPERANCEUR    Imaging: CARDIAC CATHETERIZATION  Result Date: 03/23/2020  Ost RCA to Prox RCA lesion is 99% stenosed.  Post intervention, there is a 0% residual stenosis.  A drug-eluting stent was successfully placed using a STENT RESOLUTE ONYX 4.0X18.  Successful angioplasty and drug-eluting stent placement to the ostial right coronary artery.  60 mL of contrast was used. Recommendations: Continue dual antiplatelet therapy for at least 6 months. The patient can undergo endoscopic GI procedures from a cardiac standpoint at an overall moderate risk.  Although his aortic stenosis is severe, it does not seem to be critical. Maintain a hemoglobin above 8. The patient will likely require starting dialysis very soon in order to facilitate continued evaluation  for TAVR. The patient is still volume overloaded and his LVEDP was 31 mmHg.  I changed torsemide to 40 mg twice daily. I discontinued heparin drip and nitroglycerin drip.   ECHOCARDIOGRAM COMPLETE  Result Date: 03/22/2020    ECHOCARDIOGRAM REPORT   Patient Name:   [name] [name] Date of Exam: 03/22/2020 Medical Rec #:  [id number]        Height:       66.0 in Accession #:    [id number]       Weight:       233.9 lb Date of Birth:  11/17/1955         BSA:          2.137 m Patient Age:    76 years         BP:           113/70 mmHg Patient Gender: M                HR:           79 bpm. Exam Location:  Inpatient Procedure: 2D Echo, Cardiac Doppler and Color Doppler Indications:    Chest Pain 786.50 / R07.9  History:        Patient has prior history of Echocardiogram examinations, most                 recent 01/14/2020. CAD, Stroke; Risk Factors:Hypertension,                 Dyslipidemia, Diabetes and GERD.  Sonographer:    Bernadene Person RDCS Referring Phys: [id number]  1. Left ventricular ejection fraction, by estimation, is 30-35%. The left ventricle has moderately decreased function. The left  ventricle demonstrates regional wall motion abnormalities, basal to mid inferior and inferoseptal akinesis, basal to mid inferolateral severe hypokinesis. The left ventricular internal cavity size was mildly dilated. There is mild left ventricular hypertrophy. Left ventricular diastolic parameters are consistent with Grade II diastolic dysfunction (pseudonormalization).  2. Right ventricular systolic function is moderately reduced. The right ventricular size is normal. There is mildly elevated pulmonary artery systolic pressure. The estimated right ventricular systolic pressure is 95.6 mmHg.  3. Left atrial size was moderately dilated.  4. The mitral valve is degenerative. Moderate to severe mitral valve regurgitation. No evidence of mitral stenosis.  5. The aortic valve is tricuspid. Aortic valve regurgitation is mild. Moderate aortic valve stenosis. Aortic valve area, by VTI measures 1.08 cm. Aortic valve mean gradient measures 22.0 mmHg.  6. Aortic dilatation noted. There is mild dilatation of the ascending aorta measuring 42 mm.  7. The inferior vena cava is dilated in size with >50% respiratory variability, suggesting right atrial pressure of 8 mmHg. FINDINGS  Left Ventricle: Left ventricular ejection fraction, by estimation, is 30 to 35%. The left ventricle has moderately decreased function. The left ventricle demonstrates regional wall motion abnormalities. The left ventricular internal cavity size was mildly dilated. There is mild left ventricular hypertrophy. Left ventricular diastolic parameters are consistent with Grade II diastolic dysfunction (pseudonormalization). Right Ventricle: The right ventricular size is normal. No increase in right ventricular wall thickness. Right ventricular systolic function is moderately reduced. There is mildly elevated pulmonary artery systolic pressure. The tricuspid regurgitant velocity is 2.71 m/s, and with an assumed right atrial pressure of 8 mmHg, the estimated  right ventricular systolic pressure is 38.7 mmHg. Left Atrium: Left atrial size was moderately dilated. Right Atrium: Right atrial size was normal in size. Pericardium:  There is no evidence of pericardial effusion. Mitral Valve: The mitral valve is degenerative in appearance. There is moderate calcification of the mitral valve leaflet(s). Mild to moderate mitral annular calcification. Moderate to severe mitral valve regurgitation. No evidence of mitral valve stenosis. Tricuspid Valve: The tricuspid valve is normal in structure. Tricuspid valve regurgitation is mild. Aortic Valve: The aortic valve is tricuspid. Aortic valve regurgitation is mild. Moderate aortic stenosis is present. Aortic valve mean gradient measures 22.0 mmHg. Aortic valve peak gradient measures 34.9 mmHg. Aortic valve area, by VTI measures 1.08 cm. Pulmonic Valve: The pulmonic valve was normal in structure. Pulmonic valve regurgitation is not visualized. Aorta: The aortic root is normal in size and structure and aortic dilatation noted. There is mild dilatation of the ascending aorta measuring 42 mm. Venous: The inferior vena cava is dilated in size with greater than 50% respiratory variability, suggesting right atrial pressure of 8 mmHg. IAS/Shunts: No atrial level shunt detected by color flow Doppler.  LEFT VENTRICLE PLAX 2D LVIDd:         6.50 cm      Diastology LVIDs:         6.20 cm      LV e' lateral:   6.53 cm/s LV PW:         1.00 cm      LV E/e' lateral: 25.1 LV IVS:        0.90 cm      LV e' medial:    3.16 cm/s LVOT diam:     2.20 cm      LV E/e' medial:  51.9 LV SV:         76 LV SV Index:   35 LVOT Area:     3.80 cm  LV Volumes (MOD) LV vol d, MOD A2C: 114.0 ml LV vol d, MOD A4C: 134.0 ml LV vol s, MOD A2C: 66.5 ml LV vol s, MOD A4C: 110.0 ml LV SV MOD A2C:     47.5 ml LV SV MOD A4C:     134.0 ml LV SV MOD BP:      35.4 ml RIGHT VENTRICLE RV S prime:     6.66 cm/s TAPSE (M-mode): 1.3 cm LEFT ATRIUM             Index       RIGHT  ATRIUM           Index LA diam:        5.70 cm 2.67 cm/m  RA Area:     13.40 cm LA Vol (A2C):   60.5 ml 28.31 ml/m RA Volume:   29.50 ml  13.80 ml/m LA Vol (A4C):   91.3 ml 42.72 ml/m LA Biplane Vol: 75.5 ml 35.32 ml/m  AORTIC VALVE AV Area (Vmax):    1.15 cm AV Area (Vmean):   1.19 cm AV Area (VTI):     1.08 cm AV Vmax:           295.33 cm/s AV Vmean:          217.333 cm/s AV VTI:            0.699 m AV Peak Grad:      34.9 mmHg AV Mean Grad:      22.0 mmHg LVOT Vmax:         89.20 cm/s LVOT Vmean:        67.800 cm/s LVOT VTI:          0.199 m LVOT/AV VTI ratio: 0.28  AORTA Ao Root diam: 3.60 cm Ao Asc diam:  4.20 cm MITRAL VALVE                 TRICUSPID VALVE MV Area (PHT): 2.82 cm      TR Peak grad:   29.4 mmHg MV Decel Time: 269 msec      TR Vmax:        271.00 cm/s MR Peak grad:    114.9 mmHg MR Mean grad:    69.0 mmHg   SHUNTS MR Vmax:         536.00 cm/s Systemic VTI:  0.20 m MR Vmean:        391.0 cm/s  Systemic Diam: 2.20 cm MR PISA:         0.57 cm MR PISA Eff ROA: 4 mm MR PISA Radius:  0.30 cm MV E velocity: 164.00 cm/s MV A velocity: 103.00 cm/s MV E/A ratio:  1.59 Loralie Champagne MD Electronically signed by Loralie Champagne MD Signature Date/Time: 03/22/2020/4:19:53 PM    Final      Medications:   . sodium chloride    . sodium chloride    . furosemide     . aspirin EC  81 mg Oral Daily  . busPIRone  20 mg Oral BID  . calcitRIOL  0.25 mcg Oral Q M,W,F  . calcium carbonate  250 mg Oral TID  . carvedilol  12.5 mg Oral Daily  . clopidogrel  75 mg Oral Daily  . finasteride  5 mg Oral Daily  . hydrALAZINE  25 mg Oral Q8H  . insulin aspart  0-9 Units Subcutaneous Q4H  . isosorbide mononitrate  90 mg Oral Daily  . rosuvastatin  10 mg Oral Daily  . sertraline  100 mg Oral QHS  . sodium chloride flush  3 mL Intravenous Q12H  . sodium chloride flush  3 mL Intravenous Q12H  . sodium chloride flush  3 mL Intravenous Q12H  . tamsulosin  0.4 mg Oral Daily   sodium chloride, sodium  chloride, acetaminophen, albuterol, alum & mag hydroxide-simeth, cyclobenzaprine, hydroxypropyl methylcellulose / hypromellose, nitroGLYCERIN, ondansetron (ZOFRAN) IV, sodium chloride flush, sodium chloride flush  Assessment/ Plan:   Chronic renal insufficiency stage IV/V.  High risk of contrast associated nephropathy.  Last cardiac catheterization 03/21/2020.  Losartan being held.  Etiology of renal insufficiency appears to be secondary to diabetic nephropathy followed by nephrologist Dr. [name].  There were no indications for dialysis at this point  Hypertension/volume appears adequately controlled on carvedilol, amlodipine and tamsulosin.  Patient has been transitioned to IV Lasix 160 mg every 12 hours.  Urine output 350 cc recorded 03/23/2020.  We will continue patient on IV Lasix through procedure  Secondary hyperparathyroidism PTH 118 03/10/2020 currently using calcitriol  Anemia status post transfusion 1 unit packed red blood cells 03/22/2020.  May benefit from ESA we will start low-dose darbepoetin.  Coronary artery disease.  Patient scheduled for percutaneous intervention of right coronary artery.  Appreciate assistance of Dr. Fletcher Anon cardiology  Aortic stenosis work-up for TAVR procedure.  Diabetes mellitus as per primary service  Congestive heart failure with systolic dysfunction ejection fraction 30-35%.   LOS: 3 Sherril Croon @TODAY @7 :00 AM

## 2020-03-24 NOTE — Progress Notes (Signed)
CARDIAC REHAB PHASE I   PRE:  Rate/Rhythm: 86 SR  BP:  Supine: 125/73  Sitting:   Standing:    SaO2: 95%RA  MODE:  Ambulation: 250 ft   POST:  Rate/Rhythm: 94 SR  BP:  Supine:   Sitting: 130/78  Standing:    SaO2: 97%RA [id number] Pt walked 250 ft on RA with his cane stopping several times to rest due to neuropathy. No c/o CP. Sats good on RA. Gave pt MI and CHF booklets. Discussed importance of plavix with stent. Reviewed NTG use, MI restrictions, importance of daily weights and when to call MD, low sodium food choices, signs/symptoms of CHF with low EF, and CRP 2. He stated he attended CRP 2 before. Pt stated he will need transportation as he does not drive. Will refer to [facility] and let them follow up after pt has TAVR. He voiced understanding of ed. Encouraged him to read materials when he is up to it.   Graylon Good, RN BSN  03/24/2020 9:35 AM

## 2020-03-24 NOTE — Progress Notes (Signed)
Progress Note  Patient Name: [name] [name] Date of Encounter: 03/24/2020  [facility] Cardiologist: [name], MD   Subjective   No acute overnight events. Patient denies any chest pain. Still having some shortness of breath. Seems much more on board today on staying in the hospital for complete TAVR work-up.  Inpatient Medications    Scheduled Meds:  aspirin EC  81 mg Oral Daily   busPIRone  20 mg Oral BID   calcitRIOL  0.25 mcg Oral Q M,W,F   calcium carbonate  250 mg Oral TID   carvedilol  12.5 mg Oral Daily   clopidogrel  75 mg Oral Daily   finasteride  5 mg Oral Daily   hydrALAZINE  25 mg Oral Q8H   insulin aspart  0-9 Units Subcutaneous Q4H   isosorbide mononitrate  90 mg Oral Daily   rosuvastatin  10 mg Oral Daily   sertraline  100 mg Oral QHS   sodium chloride flush  3 mL Intravenous Q12H   sodium chloride flush  3 mL Intravenous Q12H   sodium chloride flush  3 mL Intravenous Q12H   tamsulosin  0.4 mg Oral Daily   Continuous Infusions:  sodium chloride     sodium chloride     furosemide     PRN Meds: sodium chloride, sodium chloride, acetaminophen, albuterol, alum & mag hydroxide-simeth, cyclobenzaprine, hydroxypropyl methylcellulose / hypromellose, nitroGLYCERIN, ondansetron (ZOFRAN) IV, sodium chloride flush, sodium chloride flush   Vital Signs    Vitals:   03/23/20 2032 03/23/20 2053 03/24/20 0016 03/24/20 0425  BP: 134/70 134/70 137/77 (!) 144/82  Pulse: 90  90 86  Resp: (!) 21  20 20   Temp: 98 F (36.7 C)  98.2 F (36.8 C) 97.6 F (36.4 C)  TempSrc:   Oral Oral  SpO2: 96%  94% 94%  Weight:    107.2 kg  Height:    5\' 6"  (1.676 m)   No intake or output data in the 24 hours ending 03/24/20 0706 Last 3 Weights 03/24/2020 03/23/2020 03/22/2020  Weight (lbs) 236 lb 4.8 oz 235 lb 3.2 oz 233 lb 14.4 oz  Weight (kg) 107.185 kg 106.686 kg 106.096 kg  Some encounter information is confidential and restricted. Go to Review  Flowsheets activity to see all data.      Telemetry    Normal sinus rhythm with rates in the 80's. Short 6 beat run of SVT. Occasional PVCs. - Personally Reviewed  ECG    Sinus rhythm, 86 bpm, with 1st degree AV block, IVCD, and mild ST depression in V5-V6. No acute ischcemic changes compared to prior tracings. - Personally Reviewed  Physical Exam   GEN: No acute distress.   Neck: JVD difficult to assess due to body habitus.  Cardiac: RRR. III/VI systolic murmur heard throughout. No rubs or gallops. Right radial cath site soft with no signs of hematoma. Femoral cath site also soft with no signs of hematoma. Respiratory: No significant increased work of breathing. Dimnished breath sounds but no significant crackles noted. GI: Soft, non-tender, non-distended. MS: No lower extremity edema. No deformity. Neuro:  No focal deficits. Psych: Normal affect.  Labs    High Sensitivity Troponin:   Recent Labs  Lab 03/11/20 1134 03/20/20 0620 03/20/20 0827 03/20/20 1255  TROPONINIHS 15 19* 51* 86*      Chemistry Recent Labs  Lab 03/22/20 0654 03/23/20 0524 03/24/20 0357  NA 134* 133* 134*  K 3.9 4.8 4.5  CL 99 97* 96*  CO2  23 23 25   GLUCOSE 210* 218* 196*  BUN 82* 85* 92*  CREATININE 4.47* 4.68* 4.77*  CALCIUM 8.5* 8.6* 9.1  PROT  --   --  7.7  ALBUMIN  --   --  3.8  AST  --   --  20  ALT  --   --  18  ALKPHOS  --   --  72  BILITOT  --   --  0.5  GFRNONAA 13* 12* 12*  GFRAA 15* 14* 14*  ANIONGAP 12 13 13      Hematology Recent Labs  Lab 03/22/20 0654 03/22/20 1815 03/23/20 0524 03/23/20 0728 03/24/20 0357  WBC 8.7  --  8.3  --  10.9*  RBC 2.22*  --  2.57*  --  3.16*  HGB 7.1*   < > 7.9* 7.9* 9.5*  HCT 22.5*   < > 25.1* 25.1* 30.2*  MCV 101.4*  --  97.7  --  95.6  MCH 32.0  --  30.7  --  30.1  MCHC 31.6  --  31.5  --  31.5  RDW 14.3  --  16.2*  --  17.1*  PLT 135*  --  128*  --  159   < > = values in this interval not displayed.    BNP Recent Labs    Lab 03/20/20 0620  BNP 503.2*     DDimer No results for input(s): DDIMER in the last 168 hours.   Radiology    CARDIAC CATHETERIZATION  Result Date: 03/23/2020  Ost RCA to Prox RCA lesion is 99% stenosed.  Post intervention, there is a 0% residual stenosis.  A drug-eluting stent was successfully placed using a STENT RESOLUTE ONYX 4.0X18.  Successful angioplasty and drug-eluting stent placement to the ostial right coronary artery.  60 mL of contrast was used. Recommendations: Continue dual antiplatelet therapy for at least 6 months. The patient can undergo endoscopic GI procedures from a cardiac standpoint at an overall moderate risk.  Although his aortic stenosis is severe, it does not seem to be critical. Maintain a hemoglobin above 8. The patient will likely require starting dialysis very soon in order to facilitate continued evaluation for TAVR. The patient is still volume overloaded and his LVEDP was 31 mmHg.  I changed torsemide to 40 mg twice daily. I discontinued heparin drip and nitroglycerin drip.   ECHOCARDIOGRAM COMPLETE  Result Date: 03/22/2020    ECHOCARDIOGRAM REPORT   Patient Name:   TELL ROZELLE Date of Exam: 03/22/2020 Medical Rec #:  [id number]        Height:       66.0 in Accession #:    [id number]       Weight:       233.9 lb Date of Birth:  21-Apr-1956         BSA:          2.137 m Patient Age:    64 years         BP:           113/70 mmHg Patient Gender: M                HR:           79 bpm. Exam Location:  Inpatient Procedure: 2D Echo, Cardiac Doppler and Color Doppler Indications:    Chest Pain 786.50 / R07.9  History:        Patient has prior history of Echocardiogram examinations, most  recent 01/14/2020. CAD, Stroke; Risk Factors:Hypertension,                 Dyslipidemia, Diabetes and GERD.  Sonographer:    Bernadene Person RDCS Referring Phys: 24 [place]  1. Left ventricular ejection fraction, by estimation, is 30-35%. The left  ventricle has moderately decreased function. The left ventricle demonstrates regional wall motion abnormalities, basal to mid inferior and inferoseptal akinesis, basal to mid inferolateral severe hypokinesis. The left ventricular internal cavity size was mildly dilated. There is mild left ventricular hypertrophy. Left ventricular diastolic parameters are consistent with Grade II diastolic dysfunction (pseudonormalization).  2. Right ventricular systolic function is moderately reduced. The right ventricular size is normal. There is mildly elevated pulmonary artery systolic pressure. The estimated right ventricular systolic pressure is 24.4 mmHg.  3. Left atrial size was moderately dilated.  4. The mitral valve is degenerative. Moderate to severe mitral valve regurgitation. No evidence of mitral stenosis.  5. The aortic valve is tricuspid. Aortic valve regurgitation is mild. Moderate aortic valve stenosis. Aortic valve area, by VTI measures 1.08 cm. Aortic valve mean gradient measures 22.0 mmHg.  6. Aortic dilatation noted. There is mild dilatation of the ascending aorta measuring 42 mm.  7. The inferior vena cava is dilated in size with >50% respiratory variability, suggesting right atrial pressure of 8 mmHg. FINDINGS  Left Ventricle: Left ventricular ejection fraction, by estimation, is 30 to 35%. The left ventricle has moderately decreased function. The left ventricle demonstrates regional wall motion abnormalities. The left ventricular internal cavity size was mildly dilated. There is mild left ventricular hypertrophy. Left ventricular diastolic parameters are consistent with Grade II diastolic dysfunction (pseudonormalization). Right Ventricle: The right ventricular size is normal. No increase in right ventricular wall thickness. Right ventricular systolic function is moderately reduced. There is mildly elevated pulmonary artery systolic pressure. The tricuspid regurgitant velocity is 2.71 m/s, and with an  assumed right atrial pressure of 8 mmHg, the estimated right ventricular systolic pressure is 01.0 mmHg. Left Atrium: Left atrial size was moderately dilated. Right Atrium: Right atrial size was normal in size. Pericardium: There is no evidence of pericardial effusion. Mitral Valve: The mitral valve is degenerative in appearance. There is moderate calcification of the mitral valve leaflet(s). Mild to moderate mitral annular calcification. Moderate to severe mitral valve regurgitation. No evidence of mitral valve stenosis. Tricuspid Valve: The tricuspid valve is normal in structure. Tricuspid valve regurgitation is mild. Aortic Valve: The aortic valve is tricuspid. Aortic valve regurgitation is mild. Moderate aortic stenosis is present. Aortic valve mean gradient measures 22.0 mmHg. Aortic valve peak gradient measures 34.9 mmHg. Aortic valve area, by VTI measures 1.08 cm. Pulmonic Valve: The pulmonic valve was normal in structure. Pulmonic valve regurgitation is not visualized. Aorta: The aortic root is normal in size and structure and aortic dilatation noted. There is mild dilatation of the ascending aorta measuring 42 mm. Venous: The inferior vena cava is dilated in size with greater than 50% respiratory variability, suggesting right atrial pressure of 8 mmHg. IAS/Shunts: No atrial level shunt detected by color flow Doppler.  LEFT VENTRICLE PLAX 2D LVIDd:         6.50 cm      Diastology LVIDs:         6.20 cm      LV e' lateral:   6.53 cm/s LV PW:         1.00 cm      LV E/e' lateral: 25.1 LV IVS:  0.90 cm      LV e' medial:    3.16 cm/s LVOT diam:     2.20 cm      LV E/e' medial:  51.9 LV SV:         76 LV SV Index:   35 LVOT Area:     3.80 cm  LV Volumes (MOD) LV vol d, MOD A2C: 114.0 ml LV vol d, MOD A4C: 134.0 ml LV vol s, MOD A2C: 66.5 ml LV vol s, MOD A4C: 110.0 ml LV SV MOD A2C:     47.5 ml LV SV MOD A4C:     134.0 ml LV SV MOD BP:      35.4 ml RIGHT VENTRICLE RV S prime:     6.66 cm/s TAPSE  (M-mode): 1.3 cm LEFT ATRIUM             Index       RIGHT ATRIUM           Index LA diam:        5.70 cm 2.67 cm/m  RA Area:     13.40 cm LA Vol (A2C):   60.5 ml 28.31 ml/m RA Volume:   29.50 ml  13.80 ml/m LA Vol (A4C):   91.3 ml 42.72 ml/m LA Biplane Vol: 75.5 ml 35.32 ml/m  AORTIC VALVE AV Area (Vmax):    1.15 cm AV Area (Vmean):   1.19 cm AV Area (VTI):     1.08 cm AV Vmax:           295.33 cm/s AV Vmean:          217.333 cm/s AV VTI:            0.699 m AV Peak Grad:      34.9 mmHg AV Mean Grad:      22.0 mmHg LVOT Vmax:         89.20 cm/s LVOT Vmean:        67.800 cm/s LVOT VTI:          0.199 m LVOT/AV VTI ratio: 0.28  AORTA Ao Root diam: 3.60 cm Ao Asc diam:  4.20 cm MITRAL VALVE                 TRICUSPID VALVE MV Area (PHT): 2.82 cm      TR Peak grad:   29.4 mmHg MV Decel Time: 269 msec      TR Vmax:        271.00 cm/s MR Peak grad:    114.9 mmHg MR Mean grad:    69.0 mmHg   SHUNTS MR Vmax:         536.00 cm/s Systemic VTI:  0.20 m MR Vmean:        391.0 cm/s  Systemic Diam: 2.20 cm MR PISA:         0.57 cm MR PISA Eff ROA: 4 mm MR PISA Radius:  0.30 cm MV E velocity: 164.00 cm/s MV A velocity: 103.00 cm/s MV E/A ratio:  1.59 Loralie Champagne MD Electronically signed by Loralie Champagne MD Signature Date/Time: 03/22/2020/4:19:53 PM    Final     Cardiac Studies   Right/Left Cardiac Catheterization 03/21/2020:  Ost Cx to Prox Cx lesion is 85% stenosed.  Mid Cx to Dist Cx lesion is 100% stenosed.  1st Mrg lesion is 70% stenosed.  Prox LAD to Mid LAD lesion is 90% stenosed.  Ost RCA to Prox RCA lesion is 99% stenosed.  SVG.  The graft exhibits  mild diffuse disease.  Left radial artery graft was visualized by angiography.  Origin lesion is 100% stenosed.  LIMA graft was visualized by non-selective angiography and is normal in caliber.  The graft exhibits no disease.   1.  Significant underlying three-vessel coronary artery disease with patent LIMA to LAD and SVG to OM 3.  Radial  graft to RCA is occluded at the ostium with critical stenosis of the ostial native right coronary artery.  This is the likely culprit for recent non-STEMI and current unstable angina. 2.  Severe aortic stenosis with peak gradient of 27.4 mmHg and valve area of 0.87. 3.  Right heart catheterization showed moderately to severely elevated filling pressures with pulmonary wedge pressure of 31 mmHg, moderate pulmonary hypertension at 50/30 mmHg and normal cardiac output at 5.06 with a cardiac index of 2.36.  Prominent V waves on wedge pressure tracing suggestive of significant mitral regurgitation.  Recommendations: This is an overall difficult case.  The patient will need to be evaluated for TAVR plus RCA PCI.  Advanced chronic kidney disease and anemia in addition to significant heart failure make timing of this difficult.  The patient will likely need to be initiated on hemodialysis in order to improve his volume status.  He needs blood transfusion to keep his hemoglobin above 9.  Nonetheless, he might not be able to tolerate large volume shifts due to significant coronary artery disease and aortic stenosis. I will discuss with our structural heart team.  It might be best to transfer the patient to [facility]. Vascular access on this patient is very difficult due to extensive stenting and calcifications of the lower extremities.  The SFA stent on the right side might be extending into the common femoral artery.  Arterial access might be easier via the left femoral artery. _______________  Echocardiogram 03/22/2020: Impressions: 1. Left ventricular ejection fraction, by estimation, is 30-35%. The left  ventricle has moderately decreased function. The left ventricle  demonstrates regional wall motion abnormalities, basal to mid inferior and  inferoseptal akinesis, basal to mid  inferolateral severe hypokinesis. The left ventricular internal cavity  size was mildly dilated. There is mild left  ventricular hypertrophy. Left  ventricular diastolic parameters are consistent with Grade II diastolic  dysfunction (pseudonormalization).  2. Right ventricular systolic function is moderately reduced. The right  ventricular size is normal. There is mildly elevated pulmonary artery  systolic pressure. The estimated right ventricular systolic pressure is  73.2 mmHg.  3. Left atrial size was moderately dilated.  4. The mitral valve is degenerative. Moderate to severe mitral valve  regurgitation. No evidence of mitral stenosis.  5. The aortic valve is tricuspid. Aortic valve regurgitation is mild.  Moderate aortic valve stenosis. Aortic valve area, by VTI measures 1.08  cm. Aortic valve mean gradient measures 22.0 mmHg.  6. Aortic dilatation noted. There is mild dilatation of the ascending  aorta measuring 42 mm.  7. The inferior vena cava is dilated in size with >50% respiratory  variability, suggesting right atrial pressure of 8 mmHg. _______________  Coronary Stent Intervention 03/23/2020:  Ost RCA to Prox RCA lesion is 99% stenosed.  Post intervention, there is a 0% residual stenosis.  A drug-eluting stent was successfully placed using a STENT RESOLUTE ONYX 4.0X18.   Successful angioplasty and drug-eluting stent placement to the ostial right coronary artery.  60 mL of contrast was used.  Recommendations: Continue dual antiplatelet therapy for at least 6 months. The patient can undergo endoscopic GI procedures  from a cardiac standpoint at an overall moderate risk.  Although his aortic stenosis is severe, it does not seem to be critical.  Maintain a hemoglobin above 8. The patient will likely require starting dialysis very soon in order to facilitate continued evaluation for TAVR. The patient is still volume overloaded and his LVEDP was 31 mmHg.  I changed torsemide to 40 mg twice daily. I discontinued heparin drip and nitroglycerin drip.   Patient Profile     63 y.o.  male with a history of CAD s/p CABG, severe aortic stenosis, ischemic cardiomyopathy with chronic combined CHF, PAD,obstructive sleep apnea on CPAP, hypertension, diabetes mellitus, CKD stage IV, chronic anemia who was admitted to [facility] on 03/20/2020 with NSTEMI and found to have very tight native 99% lesion in the ostial RCA. His bypass graft was down to that vessel. He was transferred to [facility] for PCI to RCA as well as work-up of his low flow low gradient severe aortic stenosis.  Assessment & Plan    NSTEMI - Patient admitted to [facility] on 03/20/2020 with NSTEMI and found to have very right native ostial RCA lesion. Transferred to [facility] and underwent successful PCI/DES to this lesion. - High-sensitivity troponin only peaked at 86.  - No angina. - Continue DAPT with Aspirin and Plavix. - Continue Coreg 12.5mg  twice daily and Crestor 10mg  daily.  Low Flow Low Gradient Severe Aortic Stenosis  - Echo on 03/22/2020 showed EF down from 50-55% to 30-35% with moderate aortic stenosis. However, felt to be severe on cath on 8/30 with peak gradient of 27.4 mmHg and valve area of 0.87.  - Has been seen by Dr. Angelena Form who is managing work-up for TAVR. It is suspected that he will end up needing inpatient TAVR given likely need for hemodialysis.  Ischemic Cardiomyopathy with Acute on Chronic Combined CHF - BNP elevated in the 500's. - Echo as above. - LVEDP still elevated at 31 mmHg on cath on 03/23/2020. - Nephrology managing diuresis. Currently on IV Lasix 160mg  twice daily. Documented 4.2 L of urinary output over the last 24 hours but only net negative 138 cc this admission. Weight up 2 lbs from admission. Renal function stable after cath. - Continue Coreg as above. - Continue Hydralazine 25mg  three times daily and Imdur 90mg  daily. - No ACEi/ARB/ARNI or Spironolactone given renal function. - Continue to monitor daily weights, strict I/O's, and renal function.  CKD Stage IV - Creatinine slightly  up from yesterday at 4.77. BUN 92.  - Nephrology following. Still making urine. No need for dialysis at this point but will likely require dialysis during work-up for TAVR.  Acute on Chronic Anemia - Hemoglobin around 10 on admission to [facility] and has continued to decline. Dropped to 7.1. Received 1 unit of PRBCs and hemoglobin improved to 8.5 and then dropped back down to 7.9. Improved to 9.5 today. - Hemoccult positive. - Ferritin normal. - GI following. They are deciding on whether diagnostic EGD/colonoscopy is needed.  Hypertension - BP mostly well controlled. - Continue Coreg, Hydralazine, and Imdur as above. - Previously on Amlodipine but this was discontinued.  Diabetes  - On sliding scale insulin.  Obstructive Sleep Apnea - Continue CPAP at night.   For questions or updates, please contact [place] Please consult [email or web address] for contact info under        Signed, Darreld Mclean, PA-C  03/24/2020, 7:07 AM

## 2020-03-24 NOTE — Progress Notes (Addendum)
Daily Rounding Note  03/24/2020, 8:19 AM  LOS: 3 days   SUBJECTIVE:   Chief complaint: FOBT + anemia    Did well w cardiac cath and stenting yesterday. Brown formed stool early this AM Feels well Ok w staying inpt for any necessary tests  OBJECTIVE:         Vital signs in last 24 hours:    Temp:  [97.3 F (36.3 C)-98.2 F (36.8 C)] 97.6 F (36.4 C) (09/02 0425) Pulse Rate:  [84-94] 86 (09/02 0425) Resp:  [13-27] 20 (09/02 0425) BP: (124-174)/(64-115) 144/82 (09/02 0425) SpO2:  [92 %-100 %] 94 % (09/02 0425) Weight:  [107.2 kg] 107.2 kg (09/02 0425) Last BM Date: 03/20/20 Filed Weights   03/22/20 0500 03/23/20 0015 03/24/20 0425  Weight: 106.1 kg 106.7 kg 107.2 kg   General: talkative, comfortable, obese.  No distress and not acutely ill looking  Heart: RRR.  + harsh murmer.   Chest: clear bil.  No dyspnea or cough Abdomen: soft, NT, ND, active BS, obese.  Medium sised bruise in LLQ, 4 inches x 2 inches.    Extremities: + pedal edema Neuro/Psych:  Oriented x 3.  No tremors or gross weakness.  Fluid speech.    Intake/Output from previous day: No intake/output data recorded.  Intake/Output this shift: No intake/output data recorded.  Lab Results: Recent Labs    03/22/20 0654 03/22/20 1815 03/23/20 0524 03/23/20 0728 03/24/20 0357  WBC 8.7  --  8.3  --  10.9*  HGB 7.1*   < > 7.9* 7.9* 9.5*  HCT 22.5*   < > 25.1* 25.1* 30.2*  PLT 135*  --  128*  --  159   < > = values in this interval not displayed.   BMET Recent Labs    03/22/20 0654 03/23/20 0524 03/24/20 0357  NA 134* 133* 134*  K 3.9 4.8 4.5  CL 99 97* 96*  CO2 23 23 25   GLUCOSE 210* 218* 196*  BUN 82* 85* 92*  CREATININE 4.47* 4.68* 4.77*  CALCIUM 8.5* 8.6* 9.1   LFT Recent Labs    03/24/20 0357  PROT 7.7  ALBUMIN 3.8  AST 20  ALT 18  ALKPHOS 72  BILITOT 0.5   PT/INR No results for input(s): LABPROT, INR in the last 72  hours. Hepatitis Panel No results for input(s): HEPBSAG, HCVAB, HEPAIGM, HEPBIGM in the last 72 hours.  Studies/Results: CARDIAC CATHETERIZATION  Result Date: 03/23/2020  Ost RCA to Prox RCA lesion is 99% stenosed.  Post intervention, there is a 0% residual stenosis.  A drug-eluting stent was successfully placed using a STENT RESOLUTE ONYX 4.0X18.  Successful angioplasty and drug-eluting stent placement to the ostial right coronary artery.  60 mL of contrast was used. Recommendations: Continue dual antiplatelet therapy for at least 6 months. The patient can undergo endoscopic GI procedures from a cardiac standpoint at an overall moderate risk.  Although his aortic stenosis is severe, it does not seem to be critical. Maintain a hemoglobin above 8. The patient will likely require starting dialysis very soon in order to facilitate continued evaluation for TAVR. The patient is still volume overloaded and his LVEDP was 31 mmHg.  I changed torsemide to 40 mg twice daily. I discontinued heparin drip and nitroglycerin drip.   ECHOCARDIOGRAM COMPLETE  Result Date: 03/22/2020 IMPRESSIONS  1. Left ventricular ejection fraction, by estimation, is 30-35%. The left ventricle has moderately decreased function. The left ventricle demonstrates regional  wall motion abnormalities, basal to mid inferior and inferoseptal akinesis, basal to mid inferolateral severe hypokinesis. The left ventricular internal cavity size was mildly dilated. There is mild left ventricular hypertrophy. Left ventricular diastolic parameters are consistent with Grade II diastolic dysfunction (pseudonormalization).  2. Right ventricular systolic function is moderately reduced. The right ventricular size is normal. There is mildly elevated pulmonary artery systolic pressure. The estimated right ventricular systolic pressure is 83.1 mmHg.  3. Left atrial size was moderately dilated.  4. The mitral valve is degenerative. Moderate to severe mitral  valve regurgitation. No evidence of mitral stenosis.  5. The aortic valve is tricuspid. Aortic valve regurgitation is mild. Moderate aortic valve stenosis. Aortic valve area, by VTI measures 1.08 cm. Aortic valve mean gradient measures 22.0 mmHg.  6. Aortic dilatation noted. There is mild dilatation of the ascending aorta measuring 42 mm.  7. The inferior vena cava is dilated in size with >50% respiratory variability, suggesting right atrial pressure of 8 mmHg.   Electronically signed by Loralie Champagne MD Signature Date/Time: 03/22/2020/4:19:53 PM    Final    Scheduled Meds: . aspirin EC  81 mg Oral Daily  . busPIRone  20 mg Oral BID  . calcitRIOL  0.25 mcg Oral Q M,W,F  . calcium carbonate  250 mg Oral TID  . carvedilol  12.5 mg Oral Daily  . clopidogrel  75 mg Oral Daily  . darbepoetin (ARANESP) injection - NON-DIALYSIS  25 mcg Subcutaneous Q Thu-1800  . finasteride  5 mg Oral Daily  . hydrALAZINE  25 mg Oral Q8H  . insulin aspart  0-9 Units Subcutaneous Q4H  . isosorbide mononitrate  90 mg Oral Daily  . rosuvastatin  10 mg Oral Daily  . sertraline  100 mg Oral QHS  . sodium chloride flush  3 mL Intravenous Q12H  . sodium chloride flush  3 mL Intravenous Q12H  . sodium chloride flush  3 mL Intravenous Q12H  . tamsulosin  0.4 mg Oral Daily   Continuous Infusions: . sodium chloride    . sodium chloride    . furosemide     PRN Meds:.sodium chloride, sodium chloride, acetaminophen, albuterol, alum & mag hydroxide-simeth, cyclobenzaprine, hydroxypropyl methylcellulose / hypromellose, nitroGLYCERIN, ondansetron (ZOFRAN) IV, sodium chloride flush, sodium chloride flush   ASSESMENT:   *   FOBT + anemia. Hx adenomatous polyps, last colonoscopy 2016 Diffuse, non-specific gastropathy per EGD 2016.  Apparently no PPI at home.   Hgb 7.1 >> 2 PRBC >> 9.5.   Ferritin 32 >> 45.  Iron, TIBC, iron sats, folate, B12 ok.    *   CAD, PVD, NSTEMI. Interventional cath 9/1 w DES placed to ostial RCA  lesion.  Will need at least 6 months continuation of chronic DAPT   *   Stage 5 CKD.  Renal following, see no indications for HD at present.     *   Aortic stenosis.  TAVR in future??   *   CHF.  EF 30 to 35%.  Currently clinically well compensated   PLAN   *  Dr Fletcher Anon states "pt can undergo endoscopic GI procedures from a cardiac standpoint at an overall moderate risk.  Although his aortic stenosis is severe, it does not seem to be critical"  *   ? Timing of EGD, colonoscopy for wup of anemia, FOBT + stools?  Inpt? Since pt has issues w transportation to medical appts. Divide procedures w EGD now and hold off on colonoscopy?  Azucena Freed  03/24/2020, 8:19 AM Phone [phone]   Attending physician's note   I have taken an interval history, reviewed the chart and examined the patient. I agree with the Advanced Practitioner's note, impression and recommendations.   NSTEMI S/p cardiac cath with PCI, drug eluting stent on ASA and Plavix Severe aortic stenosis Ischemic cardiomyopathy, volume overload, getting IV lasix and on cpap ESRD, planning to start dialysis as outpatient  Chronic anemia secondary to occult GI blood loss, ESRD and anemia of chronic disease Started on Darbopoetin by nephrology, IV iron infusion  No overt GI bleeding Hgb responded appropriately to pRBC Will hold off diagnostic EGD and colonoscopy unless needed for therapeutic intervention  Will plan to proceed with endoscopic evaluation in 3-6 months once he is better from cardio pulmonary status to decrease anaesthesia and procedure related risks and potential complications Continue supportive care Follow up in GI office in 6-8 weeks with Dr Hilarie Fredrickson or APP   I have spent >35 minutes of patient care (this includes precharting, chart review, review of results, face-to-face time used for counseling as well as treatment plan and follow-up. The patient was provided an opportunity to ask questions and all were  answered. The patient agreed with the plan and demonstrated an understanding of the instructions.  Damaris Hippo , MD    CC: No ref. provider found    Damaris Hippo , MD [phone]

## 2020-03-24 NOTE — Progress Notes (Signed)
Patient removed leads and proceeded to take a shower, patient educated on importance of notifying nurse and receiving Dr's order prior to attempting a shower. Patient stated that he vomited and wanted to get cleaned up. Zofran administered. Will continue to monitor.   Elaina Hoops, RN

## 2020-03-25 ENCOUNTER — Ambulatory Visit: Payer: Medicare Other | Admitting: Cardiovascular Disease

## 2020-03-25 ENCOUNTER — Encounter (HOSPITAL_COMMUNITY): Payer: Self-pay | Admitting: Cardiovascular Disease

## 2020-03-25 ENCOUNTER — Telehealth: Payer: Self-pay | Admitting: Cardiovascular Disease

## 2020-03-25 DIAGNOSIS — I255 Ischemic cardiomyopathy: Secondary | ICD-10-CM

## 2020-03-25 LAB — BASIC METABOLIC PANEL
Anion gap: 11 (ref 5–15)
BUN: 93 mg/dL — ABNORMAL HIGH (ref 8–23)
CO2: 25 mmol/L (ref 22–32)
Calcium: 7.9 mg/dL — ABNORMAL LOW (ref 8.9–10.3)
Chloride: 97 mmol/L — ABNORMAL LOW (ref 98–111)
Creatinine, Ser: 5.19 mg/dL — ABNORMAL HIGH (ref 0.61–1.24)
GFR calc Af Amer: 12 mL/min — ABNORMAL LOW (ref >60)
GFR calc non Af Amer: 11 mL/min — ABNORMAL LOW (ref >60)
Glucose, Bld: 211 mg/dL — ABNORMAL HIGH (ref 70–99)
Potassium: 4.1 mmol/L (ref 3.5–5.1)
Sodium: 133 mmol/L — ABNORMAL LOW (ref 135–145)

## 2020-03-25 LAB — CBC
HCT: 28 % — ABNORMAL LOW (ref 39.0–52.0)
Hemoglobin: 8.8 g/dL — ABNORMAL LOW (ref 13.0–17.0)
MCH: 30.8 pg (ref 26.0–34.0)
MCHC: 31.4 g/dL (ref 30.0–36.0)
MCV: 97.9 fL (ref 80.0–100.0)
Platelets: 141 10*3/uL — ABNORMAL LOW (ref 150–400)
RBC: 2.86 MIL/uL — ABNORMAL LOW (ref 4.22–5.81)
RDW: 16.7 % — ABNORMAL HIGH (ref 11.5–15.5)
WBC: 9.2 10*3/uL (ref 4.0–10.5)
nRBC: 0 % (ref 0.0–0.2)

## 2020-03-25 LAB — MAGNESIUM: Magnesium: 2.8 mg/dL — ABNORMAL HIGH (ref 1.7–2.4)

## 2020-03-25 LAB — GLUCOSE, CAPILLARY
Glucose-Capillary: 132 mg/dL — ABNORMAL HIGH (ref 70–99)
Glucose-Capillary: 264 mg/dL — ABNORMAL HIGH (ref 70–99)

## 2020-03-25 MED ORDER — BISACODYL 10 MG RE SUPP
10.0000 mg | Freq: Once | RECTAL | Status: DC
  Filled 2020-03-25: qty 1

## 2020-03-25 MED ORDER — CLOPIDOGREL BISULFATE 75 MG PO TABS: 75.0000 mg | ORAL_TABLET | Freq: Every day | ORAL | 11 refills | Status: DC

## 2020-03-25 MED ORDER — HYDRALAZINE HCL 25 MG PO TABS: 25.0000 mg | ORAL_TABLET | Freq: Three times a day (TID) | ORAL | 2 refills | Status: DC

## 2020-03-25 MED ORDER — ROSUVASTATIN CALCIUM 10 MG PO TABS: 10.0000 mg | ORAL_TABLET | Freq: Every day | ORAL | 2 refills | Status: DC

## 2020-03-25 MED FILL — ROSUVASTATIN CALCIUM 10 MG: 10 | 30 days supply | Qty: 30 | Fill #0

## 2020-03-25 MED FILL — CLOPIDOGREL 75 MG TABLET: 75 | 30 days supply | Qty: 30 | Fill #0

## 2020-03-25 MED FILL — hydrALAZINE HCL 25 MG TABS: 25 | 30 days supply | Qty: 90 | Fill #0

## 2020-03-25 NOTE — Telephone Encounter (Signed)
TCM....  Patient is being discharged     They are scheduled to see [facility] 9/16 AT 10 AM  They were seen for NSTEMI        Please call

## 2020-03-25 NOTE — Progress Notes (Signed)
[facility] ROUNDING NOTE   Subjective:   Brief history.This is a 64 year old gentleman with a history of CAD status post CABG in 2012, severe aortic stenosis right-sided heart cath 03/21/2020, carotid disease status post left-sided CEA 12/2019, diabetes mellitus type 2, CKD stage IV, anemia, mildly dilated aortic root, hypertension hyperlipidemia, PAD status post extensive lower extremity revascularization with vascular surgery 05/2019.  History of tobacco abuse obesity and psoriasis.  Underwent cardiac catheterization 03/21/2020 three-vessel coronary artery and severe aortic stenosis with multiple tree advanced pulmonary hypertension significant mitral regurgitation transferred to [facility] for possible TAVR plus RCA PCI.  Blood pressure 132/89 pulse 79 temperature 97.6 O2 sats 96% room air  Labs pending 03/25/2020  Amlodipine 2.5 mg daily aspirin 81 mg daily BuSpar 20 mg twice daily Calcitrol 0.25 mg Monday Wednesday Friday Tums with meals, Coreg 12.5 mg daily Plavix 75 mg daily Proscar 5 mg daily , isosorbide 90 mg daily, prednisone premed, Zoloft 100 mg daily Flomax 0.4 mg daily, hydralazine 25 mg every 8 hours  IV Lasix 160 mg twice daily  Objective:  Vital signs in last 24 hours:  Temp:  [97.5 F (36.4 C)-98 F (36.7 C)] 97.5 F (36.4 C) (09/03 0822) Pulse Rate:  [76-85] 80 (09/03 0822) Resp:  [16-20] 20 (09/03 0820) BP: (124-152)/(51-106) 152/89 (09/03 0820) SpO2:  [92 %-99 %] 96 % (09/03 0822) Weight:  [106.6 kg] 106.6 kg (09/03 0555)  Weight change: -0.544 kg Filed Weights   03/23/20 0015 03/24/20 0425 03/25/20 0555  Weight: 106.7 kg 107.2 kg 106.6 kg    Intake/Output: I/O last 3 completed shifts: In: 734 [P.O.:840; Other:80] Out: 600 [Urine:600]   Intake/Output this shift:  No intake/output data recorded. Alert and oriented CVS- RRR JVP not elevated RS-diminished breath sounds left no wheeze rales ABD- BS present soft non-distended EXT- no  edema   Basic Metabolic Panel: Recent Labs  Lab 03/20/20 0620 03/20/20 0620 03/20/20 0919 03/22/20 0654 03/23/20 0524 03/24/20 0357  NA 138  --   --  134* 133* 134*  K 3.8  --   --  3.9 4.8 4.5  CL 105  --   --  99 97* 96*  CO2 20*  --   --  23 23 25   GLUCOSE 178*  --   --  210* 218* 196*  BUN 63*  --   --  82* 85* 92*  CREATININE 4.21*  --  4.58* 4.47* 4.68* 4.77*  CALCIUM 7.8*   < >  --  8.5* 8.6* 9.1  MG 2.2  --   --   --   --  2.7*  PHOS  --   --   --   --   --  5.0*   < > = values in this interval not displayed.    Liver Function Tests: Recent Labs  Lab 03/24/20 0357  AST 20  ALT 18  ALKPHOS 72  BILITOT 0.5  PROT 7.7  ALBUMIN 3.8   No results for input(s): LIPASE, AMYLASE in the last 168 hours. No results for input(s): AMMONIA in the last 168 hours.  CBC: Recent Labs  Lab 03/20/20 0620 03/20/20 0620 03/21/20 0341 03/21/20 0341 03/22/20 0654 03/22/20 1815 03/23/20 0524 03/23/20 0728 03/24/20 0357  WBC 6.6  --  5.9  --  8.7  --  8.3  --  10.9*  NEUTROABS 4.3  --   --   --  6.2  --   --   --   --  HGB 8.0*   < > 7.8*   < > 7.1* 8.5* 7.9* 7.9* 9.5*  HCT 24.3*   < > 23.9*   < > 22.5* 27.0* 25.1* 25.1* 30.2*  MCV 97.6  --  98.8  --  101.4*  --  97.7  --  95.6  PLT 125*  --  125*  --  135*  --  128*  --  159   < > = values in this interval not displayed.    Cardiac Enzymes: No results for input(s): CKTOTAL, CKMB, CKMBINDEX, TROPONINI in the last 168 hours.  BNP: Invalid input(s): POCBNP  CBG: Recent Labs  Lab 03/24/20 0805 03/24/20 1215 03/24/20 1639 03/24/20 2233 03/25/20 0817  GLUCAP 173* 188* 182* 207* 132*    Microbiology: Results for orders placed or performed during the hospital encounter of 03/20/20  SARS Coronavirus 2 by RT PCR (hospital order, performed in [facility] hospital lab) Nasopharyngeal Nasopharyngeal Swab     Status: None   Collection Time: 03/20/20  7:28 AM   Specimen: Nasopharyngeal Swab  Result Value Ref Range  Status   SARS Coronavirus 2 NEGATIVE NEGATIVE Final    Comment: (NOTE) SARS-CoV-2 target nucleic acids are NOT DETECTED.  The SARS-CoV-2 RNA is generally detectable in upper and lower respiratory specimens during the acute phase of infection. The lowest concentration of SARS-CoV-2 viral copies this assay can detect is 250 copies / mL. A negative result does not preclude SARS-CoV-2 infection and should not be used as the sole basis for treatment or other patient management decisions.  A negative result may occur with improper specimen collection / handling, submission of specimen other than nasopharyngeal swab, presence of viral mutation(s) within the areas targeted by this assay, and inadequate number of viral copies (<250 copies / mL). A negative result must be combined with clinical observations, patient history, and epidemiological information.  Fact Sheet for Patients:   StrictlyIdeas.no  Fact Sheet for Healthcare Providers: BankingDealers.co.za  This test is not yet approved or  cleared by the [place] FDA and has been authorized for detection and/or diagnosis of SARS-CoV-2 by FDA under an Emergency Use Authorization (EUA).  This EUA will remain in effect (meaning this test can be used) for the duration of the COVID-19 declaration under Section 564(b)(1) of the Act, 21 U.S.C. section 360bbb-3(b)(1), unless the authorization is terminated or revoked sooner.  Performed at [facility], [address] [place], [place] [address]     Coagulation Studies: No results for input(s): LABPROT, INR in the last 72 hours.  Urinalysis: No results for input(s): COLORURINE, LABSPEC, PHURINE, GLUCOSEU, HGBUR, BILIRUBINUR, KETONESUR, PROTEINUR, UROBILINOGEN, NITRITE, LEUKOCYTESUR in the last 72 hours.  Invalid input(s): APPERANCEUR    Imaging: CARDIAC CATHETERIZATION  Result Date: 03/23/2020  Ost RCA to Prox RCA lesion is  99% stenosed.  Post intervention, there is a 0% residual stenosis.  A drug-eluting stent was successfully placed using a STENT RESOLUTE ONYX 4.0X18.  Successful angioplasty and drug-eluting stent placement to the ostial right coronary artery.  60 mL of contrast was used. Recommendations: Continue dual antiplatelet therapy for at least 6 months. The patient can undergo endoscopic GI procedures from a cardiac standpoint at an overall moderate risk.  Although his aortic stenosis is severe, it does not seem to be critical. Maintain a hemoglobin above 8. The patient will likely require starting dialysis very soon in order to facilitate continued evaluation for TAVR. The patient is still volume overloaded and his LVEDP was 31 mmHg.  I changed torsemide to 40 mg twice daily. I discontinued heparin drip and nitroglycerin drip.     Medications:   . sodium chloride    . sodium chloride    . furosemide 160 mg (03/24/20 1816)   . aspirin EC  81 mg Oral Daily  . busPIRone  20 mg Oral BID  . calcitRIOL  0.25 mcg Oral Q M,W,F  . calcium carbonate  250 mg Oral TID  . carvedilol  12.5 mg Oral Daily  . clopidogrel  75 mg Oral Daily  . darbepoetin (ARANESP) injection - NON-DIALYSIS  25 mcg Subcutaneous Q Thu-1800  . finasteride  5 mg Oral Daily  . hydrALAZINE  25 mg Oral Q8H  . insulin aspart  0-9 Units Subcutaneous TID AC & HS  . isosorbide mononitrate  90 mg Oral Daily  . rosuvastatin  10 mg Oral Daily  . sertraline  100 mg Oral QHS  . sodium chloride flush  3 mL Intravenous Q12H  . sodium chloride flush  3 mL Intravenous Q12H  . sodium chloride flush  3 mL Intravenous Q12H  . tamsulosin  0.4 mg Oral Daily   sodium chloride, sodium chloride, acetaminophen, albuterol, alum & mag hydroxide-simeth, cyclobenzaprine, hydroxypropyl methylcellulose / hypromellose, nitroGLYCERIN, ondansetron (ZOFRAN) IV, sodium chloride flush, sodium chloride flush  Assessment/ Plan:   Chronic renal insufficiency stage  IV/V.  High risk of contrast associated nephropathy.  Last cardiac catheterization 03/21/2020.  Losartan being held.  Etiology of renal insufficiency appears to be secondary to diabetic nephropathy followed by nephrologist Dr. [name].  There were no indications for dialysis at this point  Hypertension/volume appears adequately controlled on carvedilol, amlodipine and tamsulosin.  Patient has been transitioned to IV Lasix 160 mg every 12 hours.  Urine output 100 cc recorded 03/24/2020 we will continue patient on IV Lasix through procedure  Secondary hyperparathyroidism PTH 118 03/10/2020 currently using calcitriol  Anemia status post transfusion 1 unit packed red blood cells 03/22/2020.  May benefit from ESA we will start low-dose darbepoetin.  Coronary artery disease.  Patient scheduled for percutaneous intervention of right coronary artery.  Appreciate assistance of Dr. Fletcher Anon cardiology  Aortic stenosis work-up for TAVR procedure.  Diabetes mellitus as per primary service  Congestive heart failure with systolic dysfunction ejection fraction 30-35%.   LOS: [place] @TODAY @8 :34 AM

## 2020-03-25 NOTE — Discharge Summary (Signed)
Discharge Summary    Patient ID: [name] [name] MRN: [id number]; DOB: [birth year]  Admit date: 03/21/2020 Discharge date: 03/25/2020  Primary Care Provider: [name], MD  Primary Cardiologist: [name] Sacramento, MD  Primary Electrophysiologist:  None   Discharge Diagnoses    Principal Problem:   Non-ST elevation (NSTEMI) myocardial infarction [facility]) Active Problems:   Severe aortic stenosis   CAD (coronary artery disease)   Acute on chronic combined systolic and diastolic CHF (congestive heart failure) ([place])   Ischemic cardiomyopathy   CKD stage 4 due to type 2 diabetes mellitus ([place])   Anemia   Hyperlipidemia, mixed   Essential hypertension   Obstructive sleep apnea   Carotid artery disease ([place])   Obesity   PVD (peripheral vascular disease) ([place])   DM (diabetes mellitus), type 2 with complications ([place])   Hx of CABG   Diagnostic Studies/Procedures    Right/Left Cardiac Catheterization 03/21/2020:  Ost Cx to Prox Cx lesion is 85% stenosed.  Mid Cx to Dist Cx lesion is 100% stenosed.  1st Mrg lesion is 70% stenosed.  Prox LAD to Mid LAD lesion is 90% stenosed.  Ost RCA to Prox RCA lesion is 99% stenosed.  SVG.  The graft exhibits mild diffuse disease.  Left radial artery graft was visualized by angiography.  Origin lesion is 100% stenosed.  LIMA graft was visualized by non-selective angiography and is normal in [name].  The graft exhibits no disease.  1. Significant underlying three-vessel coronary artery disease with patent LIMA to LAD and SVG to OM 3. Radial graft to RCA is occluded at the ostium with critical stenosis of the ostial native right coronary artery. This is the likely culprit for recent non-STEMI and current unstable angina. 2. Severe aortic stenosis with peak gradient of 27.4 mmHg and valve area of 0.87. 3. Right heart catheterization showed moderately to severely elevated filling pressures with pulmonary wedge pressure of 31 mmHg,  moderate pulmonary hypertension at 50/30 mmHg and normal cardiac output at 5.06 with a cardiac index of 2.36. Prominent V waves on wedge pressure tracing suggestive of significant mitral regurgitation.  Recommendations: This is an overall difficult case. The patient will need to be evaluated for TAVR plus RCA PCI. Advanced chronic kidney disease and anemia in addition to significant heart failure make timing of this difficult. The patient will likely need to be initiated on hemodialysis in order to improve his volume status. He needs blood transfusion to keep his hemoglobin above 9. Nonetheless, he might not be able to tolerate large volume shifts due to significant coronary artery disease and aortic stenosis. I will discuss with our structural heart team. It might be best to transfer the patient to [facility]. Vascular access on this patient is very difficult due to extensive stenting and calcifications of the lower extremities. The SFA stent on the right side might be extending into the common femoral artery. Arterial access might be easier via the left femoral artery.  Diagnostic Dominance: Right   _______________  Echocardiogram 03/22/2020: Impressions: 1. Left ventricular ejection fraction, by estimation, is 30-35%. The left  ventricle has moderately decreased function. The left ventricle  demonstrates regional wall motion abnormalities, basal to mid inferior and  inferoseptal akinesis, basal to mid  inferolateral severe hypokinesis. The left ventricular internal cavity  size was mildly dilated. There is mild left ventricular hypertrophy. Left  ventricular diastolic parameters are consistent with Grade II diastolic  dysfunction (pseudonormalization).  2. Right ventricular systolic function is moderately reduced. The  right  ventricular size is normal. There is mildly elevated pulmonary artery  systolic pressure. The estimated right ventricular systolic pressure is    16.9 mmHg.  3. Left atrial size was moderately dilated.  4. The mitral valve is degenerative. Moderate to severe mitral valve  regurgitation. No evidence of mitral stenosis.  5. The aortic valve is tricuspid. Aortic valve regurgitation is mild.  Moderate aortic valve stenosis. Aortic valve area, by VTI measures 1.08  cm. Aortic valve mean gradient measures 22.0 mmHg.  6. Aortic dilatation noted. There is mild dilatation of the ascending  aorta measuring 42 mm.  7. The inferior vena cava is dilated in size with >50% respiratory  variability, suggesting right atrial pressure of 8 mmHg. _______________  Coronary Stent Intervention 03/23/2020:  Ost RCA to Prox RCA lesion is 99% stenosed.  Post intervention, there is a 0% residual stenosis.  A drug-eluting stent was successfully placed using a STENT RESOLUTE ONYX 4.0X18.  Successful angioplasty and drug-eluting stent placement to the ostial right coronary artery. 60 mL of contrast was used.  Recommendations: Continue dual antiplatelet therapy for at least 6 months. The patient can undergo endoscopic GI procedures from a cardiac standpoint at an overall moderate risk. Although his aortic stenosis is severe, it does not seem to be critical.  Maintain a hemoglobin above 8. The patient will likely require starting dialysis very soon in order to facilitate continued evaluation for TAVR. The patient is still volume overloaded and his LVEDP was 31 mmHg. I changed torsemide to 40 mg twice daily. I discontinued heparin drip and nitroglycerin drip.  Diagnostic Dominance: Right  Intervention      History of Present Illness     [name] [name] is a 64 year old male with history of CAD s/p CABG in 2012, ischemic cardiomyopathy with chronic combined CHF, aortic stenosis, PAD with carotid disease s/p left CEA and lower extremity disease s/p extensive lower extremity revascularization by Vascular Surgery in 05/2019, obstructive  sleep apnea, hypertension, hyperlipidemia, type 2 diabetes mellitus, CKD stage IV, and anemia of chronic disease who is followed by Dr. Fletcher Anon.   Patient admitted to [facility] in 01/2020 with NSTEMI and acute diastolic CHF. High-sensitivity troponin peaked at 470. Patient required diuresis with IV Lasix drip. Plan was for right/left heart catheterization but had to be postponed due to worsening renal function and patient not wanting to stay in the hospital.   He presented to [facility] on 03/20/2020 for NSTEMI after he developed chest pain that felt like an elephant sitting on his chest that did not improve with multiple dose of Nitro and Aspirin. He was noted to be hypoxic with saturations in the 80's and placed on nasal cannula. High-sensitivity troponin peaked at 86. He was started on IV Heparin and IV Nitro. He underwent right/left heart catheterization on 03/21/2020 showed severe 99% stenosis of ostial to proximal RCA with occluded radial graft to RCA (other 2 grafts patent). Also noted to have severe aortic stenosis with peak gradient of 27.4 mmHg and valve area of 0.87 as well as moderately to severely elevated filling pressures with pulmonary wedge pressure of 31 mmHg, moderate pulmonary hypertension at 50/30 mmHg, and normal cardiac output at 5.06 with a cardiac index of 2.36. Prominent V waves on wedge pressure tracing suggestive of significant mitral regurgitation. Given his need for TAVR evaluation, decision made to transfer patient to [facility] for PCI of RCA and evaluation by Structural Heart Team. Case also complicated by advanced CKD and acute  on chronic anemia. Echo prior to transfer showed LVEF of 30-35% with basal to mid inferior and inferoseptal akinesis and mid inferolateral severe hypokinesis as well as grade 2 diastolic dysfunction, moderate aortic stenosis, and moderate to severe mitral regurgitation. Also showed normal RV size with moderately reduced systolic function, mildly elevated RVSP of 37.4  mmHg    Hospital Course     Consultants: Structural Heart Team, Nephrology, and GI  NSTEMI  After transfer to [facility], patient taken to cath lab on 03/23/2020 and underwent successful PCI with DES to proximal RCA lesion. Patient tolerated the procedure well. Continue dual antiplatelet therapy with Aspirin 81 mg daily and Plavix 75 mg daily. Continue home Coreg 12.5mg  twice daily. Home Simvastatin switched to Crestor 10mg  daily. Continue home Imdur 90mg  daily.   Low Flow Low Gradient Severe Aortic Stenosis  Echo on 03/22/2020 showed EF down from 50-55% to 30-35% with moderate aortic stenosis. However, felt to be severe on cath on 8/30 with peak gradient of 27.4 mmHg and valve area of 0.87. Patient seen by Dr. Angelena Form with structural heart team and plan is to continue TAVR work-up as outpatient after dialysis access is obtained.   Ischemic Cardiomyopathy with Acute on Chronic Combined CHF  BNP elevated in the 500's. Echo as above. LVEDP still elevated at 31 mmHg on cath on 03/23/2020. Patient diuresed with IV Lasix 160mg  twice daily with symptomatic improvement; however, no significant urinary response. Will discharge on home Torsemide 40mg  daily. Volume status will ultimately need to be managed by dialysis.  Continue home Coreg 12.5mg  twice daily and Imdur 90mg  daily. Started on Hydralazine 25mg  three times daily. No ACEi/ARB/ARNI or Spironolactone given renal function. Patient to follow-up with primary Nephrologist as outpatient to establish dialysis access.    CKD Stage IV  Creatinine 4.21 on admission and 5.19 on days of discharge. Nephrology consulted and felt no need for dialysis at this point. Still making urine. On Calcitriol for secondary hyperparathyroidism. Will discharge on home Torsemide. Patient needs close Nephrology follow-up for repeat labs and to discuss obtaining dialysis access. Patient report he already has appointment with primary Nephrologist scheduled for next week.   Acute  on Chronic Anemia  Hemoglobin around 10 on admission to [facility] and has continued to decline. Dropped to 7.1. Received 1 unit of PRBCs and hemoglobin improved to 8.5 and then dropped back down to 7.9. Improved to 9.5 on 03/24/2020. Hemoccult positive. Ferritin normal. Received dose of Feraheme on 03/24/2020. Aranesp was ordered but does not look like this was actually given. Hemoglobin 8.8 on day of discharge. GI consulted and given improvement in hemoglobin, decision made to hold off on diagnostic EGD/colonoscopy for 3-6 months once he more stable from cardiopulmonary status. GI recommended outpatient follow-up in 6-8 weeks.  Hypertension  BP mostly well controlled. Continue home Coreg and Imdur as above. Started on Hydralazine as above. Home Amlodipine discontinued.   Hyperlipidemia Most recent lipid panel in 01/2020: Total Cholesterol 122, Triglycerides 163, HDL 33, LDL 56. At LDL goal of <70 given CAD. He was on Simvastatin 20mg  daily at home. This was switched to Crestor 10mg  daily this admission. Will need repeat lipid panel and LFTs in 6-8 weeks after increasing statin.    Diabetes  Hemoglobin A1c 5.7 in 01/2020. Placed on sliding scale insulin during admission. Can restart home regimen at discharge.    Obstructive Sleep Apnea  Continue CPAP at night.   Patient seen and examined by Dr. Audie Box today and determined to be stable  for discharge. Outpatient follow-up arranged. Medications as below.  Did the patient have an acute coronary syndrome (MI, NSTEMI, STEMI, etc) this admission?:  Yes                               AHA/ACC Clinical Performance & Quality Measures: 4. Aspirin prescribed? - Yes 5. ADP Receptor Inhibitor (Plavix/Clopidogrel, Brilinta/Ticagrelor or Effient/Prasugrel) prescribed (includes medically managed patients)? - Yes 6. Beta Blocker prescribed? - Yes 7. High Intensity Statin (Lipitor 40-80mg  or Crestor 20-40mg ) prescribed? - No - Reason: LDL already below goal of <70 on  Simvastatin at home but switched to Crestor 10mg  daily by Dr. Fletcher Anon. 8. EF assessed during THIS hospitalization? - Yes 9. For EF <40%, was ACEI/ARB prescribed? - No - Reason:  Renal function. 10. For EF <40%, Aldosterone Antagonist (Spironolactone or Eplerenone) prescribed? - No - Reason:  Renal function. 11. Cardiac Rehab Phase II ordered (including medically managed patients)? - Yes   _____________  Discharge Vitals Blood pressure 108/61, pulse 77, temperature 97.8 F (36.6 C), temperature source Oral, resp. rate 20, height 5\' 6"  (1.676 m), weight 106.6 kg, SpO2 95 %.  Filed Weights   03/23/20 0015 03/24/20 0425 03/25/20 0555  Weight: 106.7 kg 107.2 kg 106.6 kg   General: 64 y.o. male resting comfortably in no acute distress.  HEENT: Normocephalic and atraumatic. Sclera clear.  Neck: Supple. JVD difficult to assess due to body habitus. Heart: RRR. Distinct S1 and S2. No murmurs, gallops, or rubs. Radial and distal pedal pulses 2+ and equal bilaterally. Right radial cath site soft with no signs of hematoma. Lungs: No increased work of breathing. Diminished breath sounds bilaterally. Abdomen: Soft, non-distended, and non-tender to palpation. Bowel sounds present. Extremities: No to trace lower extremity edema bilaterally.    Skin: Warm and dry. Neuro: Alert and oriented x3. No focal deficits. Psych: Normal affect. Responds appropriately.  Labs & Radiologic Studies    CBC Recent Labs    03/23/20 0524 03/23/20 0524 03/23/20 0728 03/24/20 0357  WBC 8.3  --   --  10.9*  HGB 7.9*   < > 7.9* 9.5*  HCT 25.1*   < > 25.1* 30.2*  MCV 97.7  --   --  95.6  PLT 128*  --   --  159   < > = values in this interval not displayed.   Basic Metabolic Panel Recent Labs    03/23/20 0524 03/24/20 0357  NA 133* 134*  K 4.8 4.5  CL 97* 96*  CO2 23 25  GLUCOSE 218* 196*  BUN 85* 92*  CREATININE 4.68* 4.77*  CALCIUM 8.6* 9.1  MG  --  2.7*  PHOS  --  5.0*   Liver Function Tests Recent  Labs    03/24/20 0357  AST 20  ALT 18  ALKPHOS 72  BILITOT 0.5  PROT 7.7  ALBUMIN 3.8   No results for input(s): LIPASE, AMYLASE in the last 72 hours. High Sensitivity Troponin:   Recent Labs  Lab 03/11/20 1134 03/20/20 0620 03/20/20 0827 03/20/20 1255  TROPONINIHS 15 19* 51* 86*    BNP Invalid input(s): POCBNP D-Dimer No results for input(s): DDIMER in the last 72 hours. Hemoglobin A1C No results for input(s): HGBA1C in the last 72 hours. Fasting Lipid Panel No results for input(s): CHOL, HDL, LDLCALC, TRIG, CHOLHDL, LDLDIRECT in the last 72 hours. Thyroid Function Tests No results for input(s): TSH, T4TOTAL, T3FREE, THYROIDAB in the last  72 hours.  Invalid input(s): FREET3 _____________  CARDIAC CATHETERIZATION  Result Date: 03/23/2020  Ost RCA to Prox RCA lesion is 99% stenosed.  Post intervention, there is a 0% residual stenosis.  A drug-eluting stent was successfully placed using a STENT RESOLUTE ONYX 4.0X18.  Successful angioplasty and drug-eluting stent placement to the ostial right coronary artery.  60 mL of contrast was used. Recommendations: Continue dual antiplatelet therapy for at least 6 months. The patient can undergo endoscopic GI procedures from a cardiac standpoint at an overall moderate risk.  Although his aortic stenosis is severe, it does not seem to be critical. Maintain a hemoglobin above 8. The patient will likely require starting dialysis very soon in order to facilitate continued evaluation for TAVR. The patient is still volume overloaded and his LVEDP was 31 mmHg.  I changed torsemide to 40 mg twice daily. I discontinued heparin drip and nitroglycerin drip.   CARDIAC CATHETERIZATION  Result Date: 03/21/2020  [place] Cx to Prox Cx lesion is 85% stenosed.  Mid Cx to Dist Cx lesion is 100% stenosed.  1st Mrg lesion is 70% stenosed.  Prox LAD to Mid LAD lesion is 90% stenosed.  Ost RCA to Prox RCA lesion is 99% stenosed.  SVG.  The graft exhibits mild  diffuse disease.  Left radial artery graft was visualized by angiography.  Origin lesion is 100% stenosed.  LIMA graft was visualized by non-selective angiography and is normal in [name].  The graft exhibits no disease.  1.  Significant underlying three-vessel coronary artery disease with patent LIMA to LAD and SVG to OM 3.  Radial graft to RCA is occluded at the ostium with critical stenosis of the ostial native right coronary artery.  This is the likely culprit for recent non-STEMI and current unstable angina. 2.  Severe aortic stenosis with peak gradient of 27.4 mmHg and valve area of 0.87. 3.  Right heart catheterization showed moderately to severely elevated filling pressures with pulmonary wedge pressure of 31 mmHg, moderate pulmonary hypertension at 50/30 mmHg and normal cardiac output at 5.06 with a cardiac index of 2.36.  Prominent V waves on wedge pressure tracing suggestive of significant mitral regurgitation. Recommendations: This is an overall difficult case.  The patient will need to be evaluated for TAVR plus RCA PCI.  Advanced chronic kidney disease and anemia in addition to significant heart failure make timing of this difficult.  The patient will likely need to be initiated on hemodialysis in order to improve his volume status.  He needs blood transfusion to keep his hemoglobin above 9.  Nonetheless, he might not be able to tolerate large volume shifts due to significant coronary artery disease and aortic stenosis. I will discuss with our structural heart team.  It might be best to transfer the patient to [facility]. Vascular access on this patient is very difficult due to extensive stenting and calcifications of the lower extremities.  The SFA stent on the right side might be extending into the common femoral artery.  Arterial access might be easier via the left femoral artery.   DG Chest Portable 1 View  Result Date: 03/20/2020 CLINICAL DATA:  Chest pain. EXAM: PORTABLE CHEST 1  VIEW COMPARISON:  February 15, 2020 FINDINGS: No pneumothorax. Stable cardiomegaly. Diffuse interstitial opacities are identified. No nodules or masses. No focal infiltrates. IMPRESSION: Diffuse interstitial opacities suggestive of pulmonary edema given cardiomegaly. Atypical infection could have a similar appearance. Recommend clinical correlation. Electronically Signed   By: Dorise Bullion III  M.D   On: 03/20/2020 08:08   ECHOCARDIOGRAM COMPLETE  Result Date: 03/22/2020    ECHOCARDIOGRAM REPORT   Patient Name:   LEMAR BAKOS Date of Exam: 03/22/2020 Medical Rec #:  [id number]        Height:       66.0 in Accession #:    [id number]       Weight:       233.9 lb Date of Birth:  1955-07-27         BSA:          2.137 m Patient Age:    64 years         BP:           113/70 mmHg Patient Gender: M                HR:           79 bpm. Exam Location:  Inpatient Procedure: 2D Echo, Cardiac Doppler and Color Doppler Indications:    Chest Pain 786.50 / R07.9  History:        Patient has prior history of Echocardiogram examinations, most                 recent 01/14/2020. CAD, Stroke; Risk Factors:Hypertension,                 Dyslipidemia, Diabetes and GERD.  Sonographer:    Bernadene Person RDCS Referring Phys: 14 [name]  1. Left ventricular ejection fraction, by estimation, is 30-35%. The left ventricle has moderately decreased function. The left ventricle demonstrates regional wall motion abnormalities, basal to mid inferior and inferoseptal akinesis, basal to mid inferolateral severe hypokinesis. The left ventricular internal cavity size was mildly dilated. There is mild left ventricular hypertrophy. Left ventricular diastolic parameters are consistent with Grade II diastolic dysfunction (pseudonormalization).  2. Right ventricular systolic function is moderately reduced. The right ventricular size is normal. There is mildly elevated pulmonary artery systolic pressure. The estimated right  ventricular systolic pressure is 66.4 mmHg.  3. Left atrial size was moderately dilated.  4. The mitral valve is degenerative. Moderate to severe mitral valve regurgitation. No evidence of mitral stenosis.  5. The aortic valve is tricuspid. Aortic valve regurgitation is mild. Moderate aortic valve stenosis. Aortic valve area, by VTI measures 1.08 cm. Aortic valve mean gradient measures 22.0 mmHg.  6. Aortic dilatation noted. There is mild dilatation of the ascending aorta measuring 42 mm.  7. The inferior vena cava is dilated in size with >50% respiratory variability, suggesting right atrial pressure of 8 mmHg. FINDINGS  Left Ventricle: Left ventricular ejection fraction, by estimation, is 30 to 35%. The left ventricle has moderately decreased function. The left ventricle demonstrates regional wall motion abnormalities. The left ventricular internal cavity size was mildly dilated. There is mild left ventricular hypertrophy. Left ventricular diastolic parameters are consistent with Grade II diastolic dysfunction (pseudonormalization). Right Ventricle: The right ventricular size is normal. No increase in right ventricular wall thickness. Right ventricular systolic function is moderately reduced. There is mildly elevated pulmonary artery systolic pressure. The tricuspid regurgitant velocity is 2.71 m/s, and with an assumed right atrial pressure of 8 mmHg, the estimated right ventricular systolic pressure is 40.3 mmHg. Left Atrium: Left atrial size was moderately dilated. Right Atrium: Right atrial size was normal in size. Pericardium: There is no evidence of pericardial effusion. Mitral Valve: The mitral valve is degenerative in appearance. There is moderate calcification of the mitral valve leaflet(s).  Mild to moderate mitral annular calcification. Moderate to severe mitral valve regurgitation. No evidence of mitral valve stenosis. Tricuspid Valve: The tricuspid valve is normal in structure. Tricuspid valve  regurgitation is mild. Aortic Valve: The aortic valve is tricuspid. Aortic valve regurgitation is mild. Moderate aortic stenosis is present. Aortic valve mean gradient measures 22.0 mmHg. Aortic valve peak gradient measures 34.9 mmHg. Aortic valve area, by VTI measures 1.08 cm. Pulmonic Valve: The pulmonic valve was normal in structure. Pulmonic valve regurgitation is not visualized. Aorta: The aortic root is normal in size and structure and aortic dilatation noted. There is mild dilatation of the ascending aorta measuring 42 mm. Venous: The inferior vena cava is dilated in size with greater than 50% respiratory variability, suggesting right atrial pressure of 8 mmHg. IAS/Shunts: No atrial level shunt detected by color flow Doppler.  LEFT VENTRICLE PLAX 2D LVIDd:         6.50 cm      Diastology LVIDs:         6.20 cm      LV e' lateral:   6.53 cm/s LV PW:         1.00 cm      LV E/e' lateral: 25.1 LV IVS:        0.90 cm      LV e' medial:    3.16 cm/s LVOT diam:     2.20 cm      LV E/e' medial:  51.9 LV SV:         76 LV SV Index:   35 LVOT Area:     3.80 cm  LV Volumes (MOD) LV vol d, MOD A2C: 114.0 ml LV vol d, MOD A4C: 134.0 ml LV vol s, MOD A2C: 66.5 ml LV vol s, MOD A4C: 110.0 ml LV SV MOD A2C:     47.5 ml LV SV MOD A4C:     134.0 ml LV SV MOD BP:      35.4 ml RIGHT VENTRICLE RV S prime:     6.66 cm/s TAPSE (M-mode): 1.3 cm LEFT ATRIUM             Index       RIGHT ATRIUM           Index LA diam:        5.70 cm 2.67 cm/m  RA Area:     13.40 cm LA Vol (A2C):   60.5 ml 28.31 ml/m RA Volume:   29.50 ml  13.80 ml/m LA Vol (A4C):   91.3 ml 42.72 ml/m LA Biplane Vol: 75.5 ml 35.32 ml/m  AORTIC VALVE AV Area (Vmax):    1.15 cm AV Area (Vmean):   1.19 cm AV Area (VTI):     1.08 cm AV Vmax:           295.33 cm/s AV Vmean:          217.333 cm/s AV VTI:            0.699 m AV Peak Grad:      34.9 mmHg AV Mean Grad:      22.0 mmHg LVOT Vmax:         89.20 cm/s LVOT Vmean:        67.800 cm/s LVOT VTI:           0.199 m LVOT/AV VTI ratio: 0.28  AORTA Ao Root diam: 3.60 cm Ao Asc diam:  4.20 cm MITRAL VALVE  TRICUSPID VALVE MV Area (PHT): 2.82 cm      TR Peak grad:   29.4 mmHg MV Decel Time: 269 msec      TR Vmax:        271.00 cm/s MR Peak grad:    114.9 mmHg MR Mean grad:    69.0 mmHg   SHUNTS MR Vmax:         536.00 cm/s Systemic VTI:  0.20 m MR Vmean:        391.0 cm/s  Systemic Diam: 2.20 cm MR PISA:         0.57 cm MR PISA Eff ROA: 4 mm MR PISA Radius:  0.30 cm MV E velocity: 164.00 cm/s MV A velocity: 103.00 cm/s MV E/A ratio:  1.59 Loralie Champagne MD Electronically signed by Loralie Champagne MD Signature Date/Time: 03/22/2020/4:19:53 PM    Final    Disposition   Patient is being discharged home today in good condition.  Follow-up Plans & Appointments     Follow-up Information    Pyrtle, Lajuan Lines, MD Follow up.   Specialty: Gastroenterology Why: Please call office to schedule follow-up visit in 6-8 weeks. Contact information: [address] [address] [phone]        [name], PA-C Follow up.   Specialties: Physician Assistant, Cardiology, Radiology Why: Hospital follow-up scheduled for 04/07/2020 at 10:00am with Christell Faith, one of Dr. Tyrell Antonio PAs. Please arrive 15 minutes early for check-in. If this date/time does not work for you, please call our office to reschedule. Contact information: [place] [address] [phone]        Nephrology Follow up.   Why: Please call your Nephrologist's office as soon as possible for repeat labs and to arrange dialysis access.              Discharge Instructions    Amb Referral to Cardiac Rehabilitation   Complete by: As directed    Diagnosis:  Coronary Stents NSTEMI     After initial evaluation and assessments completed: Virtual Based Care may be provided alone or in conjunction with Phase 2 Cardiac Rehab based on patient barriers.: Yes   Diet - low sodium heart healthy    Complete by: As directed    Increase activity slowly   Complete by: As directed       Discharge Medications   Allergies as of 03/25/2020      Reactions   Contrast Media [iodinated Diagnostic Agents] Rash, Other (See Comments)   Got very hot and red   Glipizide Other (See Comments)   ANTIDIABETICS. Burning   Hydroxychloroquine Other (See Comments)   Stomach upset   Metrizamide Other (See Comments)   Got very hot and red   Penicillins Hives, Swelling   Has patient had a PCN reaction causing immediate rash, facial/tongue/throat swelling, SOB or lightheadedness with hypotension: yes Has patient had a PCN reaction causing severe rash involving mucus membranes or skin necrosis: no  Has patient had a PCN reaction that required hospitalization: yes Has patient had a PCN reaction occurring within the last 10 years: no If all of the above answers are "NO", then may proceed with Cephalosporin use.      Medication List    STOP taking these medications   amLODipine 2.5 MG tablet Commonly known as: NORVASC   buPROPion 150 MG 12 hr tablet Commonly known as: WELLBUTRIN SR   heparin 25000-0.45 UT/250ML-% infusion   nitroGLYCERIN 0.2 mg/mL infusion  predniSONE 50 MG tablet Commonly known as: DELTASONE   simvastatin 20 MG tablet Commonly known as: ZOCOR     TAKE these medications   albuterol 108 (90 Base) MCG/ACT inhaler Commonly known as: ProAir HFA Inhale 2 puffs into the lungs every 6 (six) hours as needed for shortness of breath.   Artificial Tears 0.1-0.3 % Soln Generic drug: Dextran 70-Hypromellose Place 1 drop into both eyes 4 (four) times daily as needed (dry eyes).   aspirin 81 MG tablet Take 81 mg by mouth daily.   busPIRone 10 MG tablet Commonly known as: BUSPAR TAKE 2 TABLETS(20 MG) BY MOUTH TWICE DAILY What changed:   how much to take  how to take this  when to take this  additional instructions   calcitRIOL 0.25 MCG capsule Commonly known as:  ROCALTROL Take 0.25 mcg by mouth every Monday, Wednesday, and Friday. Monday then Wednesday then Friday   carvedilol 12.5 MG tablet Commonly known as: COREG Take 1 tablet (12.5 mg total) by mouth 2 (two) times daily. What changed: when to take this   clopidogrel 75 MG tablet Commonly known as: PLAVIX Take 1 tablet (75 mg total) by mouth daily.   cyclobenzaprine 10 MG tablet Commonly known as: FLEXERIL Take 10 mg by mouth 3 (three) times daily as needed for muscle spasms.   finasteride 5 MG tablet Commonly known as: PROSCAR Take 5 mg by mouth daily.   hydrALAZINE 25 MG tablet Commonly known as: APRESOLINE Take 1 tablet (25 mg total) by mouth every 8 (eight) hours.   insulin aspart 100 UNIT/ML injection Commonly known as: novoLOG Inject 0-5 Units into the skin at bedtime.   insulin aspart 100 UNIT/ML injection Commonly known as: novoLOG Inject 0-9 Units into the skin 3 (three) times daily with meals.   isosorbide mononitrate 30 MG 24 hr tablet Commonly known as: IMDUR Take 3 tablets (90 mg total) by mouth daily.   nitroGLYCERIN 0.4 MG SL tablet Commonly known as: NITROSTAT Place 1 tablet (0.4 mg total) under the tongue every 5 (five) minutes as needed. Maximum of 3 doses. What changed: reasons to take this   rosuvastatin 10 MG tablet Commonly known as: CRESTOR Take 1 tablet (10 mg total) by mouth daily. Start taking on: March 26, 2020   sertraline 100 MG tablet Commonly known as: ZOLOFT Take 1 tablet (100 mg total) by mouth daily.   tamsulosin 0.4 MG Caps capsule Commonly known as: FLOMAX Take 1 capsule by mouth daily What changed:   how much to take  how to take this  when to take this  additional instructions   torsemide 20 MG tablet Commonly known as: Demadex Take 2 tablets (40 mg total) by mouth daily.          Outstanding Labs/Studies   Repeat BMET and CBC at follow-up with Nephrology.  Duration of Discharge Encounter   Greater than  30 minutes including physician time.  Signed, Darreld Mclean, PA-C 03/25/2020, 11:42 AM

## 2020-03-25 NOTE — Telephone Encounter (Signed)
TOC-currently admitted.

## 2020-03-25 NOTE — Progress Notes (Signed)
PROGRESS NOTE  ELIGA ARVIE SHF:[id number] DOB: [birth year] DOA: 03/21/2020 PCP: [name], MD   LOS: 4 days   Brief narrative: As per HPI,  [name] [name] is a 64 y.o. male with history of CAD s/p CABG in 2012, severe aortic stenosis by RHC 03/21/2020, carotid disease s/p left-sided CEA with recent 12/2019 , DM2, CKD stage IV, anemia of chronic disease, mildly dilated aortic root, hypertension, hyperlipidemia, PAD s/p extensive lower extremity revascularization by vascular surgery 05/2019, tobacco use though reportedly recently quit, obesity, and psoriasis presented to the hospital on 03/19/2020 with chest pain and was brought into the hospital.  Patient was started on heparin and nitro infusion.  On 03/21/2020, patient underwent cardiac catheterization with three-vessel coronary artery disease with severe aortic stenosis and moderate pulmonary hypertension/significant mitral regurgitation.  Patient was then transferred from [facility] to [facility] for possible TAVR plus RCA PCI.   Medical team was consulted for management of diabetes.  Assessment/Plan:  Principal Problem:   Non-ST elevation (NSTEMI) myocardial infarction [facility]) Active Problems:   Hyperlipidemia, mixed   Essential hypertension   Obstructive sleep apnea   Carotid artery disease (HCC)   Severe aortic stenosis   Obesity   PVD (peripheral vascular disease) (HCC)   DM (diabetes mellitus), type 2 with complications ([place])   CKD stage 4 due to type 2 diabetes mellitus ([place])   Acute on chronic combined systolic and diastolic CHF (congestive heart failure) (HCC)   Hx of CABG   Anemia   CAD (coronary artery disease)   Ischemic cardiomyopathy   Diabetes mellitus type 2 with CKD stage IV.  Continue sliding scale insulin, Accu-Cheks, diabetic diet.  Closely monitor.  Latest hemoglobin A1c 1 month ago was 5.7. Hemoglobin A1c 1 year ago at 6.1.  On sliding scale insulin at home which is controlling his diabetes. Will  recommend same on discharge.  Unstable angina with severe aortic stenosis/mitral regurgitation status post cardiac cath with significant multivessel three-vessel coronary artery disease. Status post drug-eluting stent to RCA on 03/24/2019.  Patient will likely need TAVR at some point but due to anemia and renal function, surgery has been a concern..  Further management as per cardiology.  on aspirin, Plavix, Coreg, IV Lasix, hydralazine  Ischemic cardiomyopathy, volume overload, systolic congestive heart failure, pulmonary hypertension. on IV Lasix as per cardiology.  2D echocardiogram with LV function of 30 to 35%.  Chronic kidney disease stage IV.   Seen by nephrology and there is no plan for dialysis at this time.  Patient has high risk of contrast-induced nephropathy.  Losartan on hold.  On high-dose IV Lasix at this time.  Anemia of chronic kidney disease, possible chronic GI blood loss.  Monitor CBC.  Baseline hemoglobin of around 10.  Transfuse to keep more than 9.  Patient was FOBT positive.  Patient has been seen by GI. History of recurrent adenomatous polyp.  Status post 2 unit of packed RBC transfusion with hemoglobin of 9.5 on 9/2.  GI has been consulted for possible need of endoscopic evaluation but plan to proceed with endoscopy in 3 to 6 months.  GI plans to follow-up in the office in 6 to 8 weeks.  Essential hypertension.  On hydralazine, Coreg,.  Latest blood pressure 139/72  Sleep apnea.  Continue nocturnal CPAP  DVT prophylaxis: Off heparin drip, consider heparin subcutaneous if remains in hospital  Disposition. As per primary team. Okay for disposition from the medical side in terms of diabetes management. Would recommend  continuation of sliding scale insulin and diabetic diet on discharge.  Code Status: Full code  Procedures: Diagnostic cardiac catheterization  03/21/2020 Cardiac catheterization with placement of drug-eluting stent to RCA on  03/23/2020  Antibiotics:  . None  Anti-infectives (From admission, onward)   None      Subjective: Today, patient denies interval complaints. Denies any shortness of breath, chest pain, palpitation, dizziness or lightheadedness.  Objective: Vitals:   03/25/20 0820 03/25/20 0822  BP: (!) 152/89   Pulse: 81 80  Resp: 20   Temp:  (!) 97.5 F (36.4 C)  SpO2: 95% 96%    Intake/Output Summary (Last 24 hours) at 03/25/2020 0945 Last data filed at 03/25/2020 0141 Gross per 24 hour  Intake 560 ml  Output 600 ml  Net -40 ml   Filed Weights   03/23/20 0015 03/24/20 0425 03/25/20 0555  Weight: 106.7 kg 107.2 kg 106.6 kg   Body mass index is 37.95 kg/m.   Physical Exam: GENERAL: Patient is alert awake and oriented. Not in obvious distress.   Obese,  HENT: No scleral pallor or icterus. Pupils equally reactive to light. Oral mucosa is moist NECK: is supple, no gross swelling noted. CHEST: Diminished breath sounds bilaterally. CVS: S1 and S2 heard, no murmur. Regular rate and rhythm.  ABDOMEN: Soft, non-tender, bowel sounds are present. EXTREMITIES: Bilateral lower extremity edema.   CNS: Cranial nerves are intact. No focal motor deficits. SKIN: warm and dry without rashes.  Data Review: I have personally reviewed the following laboratory data and studies,  CBC: Recent Labs  Lab 03/20/20 0620 03/20/20 0620 03/21/20 0341 03/21/20 0341 03/22/20 0654 03/22/20 1815 03/23/20 0524 03/23/20 0728 03/24/20 0357  WBC 6.6  --  5.9  --  8.7  --  8.3  --  10.9*  NEUTROABS 4.3  --   --   --  6.2  --   --   --   --   HGB 8.0*   < > 7.8*   < > 7.1* 8.5* 7.9* 7.9* 9.5*  HCT 24.3*   < > 23.9*   < > 22.5* 27.0* 25.1* 25.1* 30.2*  MCV 97.6  --  98.8  --  101.4*  --  97.7  --  95.6  PLT 125*  --  125*  --  135*  --  128*  --  159   < > = values in this interval not displayed.   Basic Metabolic Panel: Recent Labs  Lab 03/20/20 0620 03/20/20 0919 03/22/20 0654 03/23/20 0524  03/24/20 0357  NA 138  --  134* 133* 134*  K 3.8  --  3.9 4.8 4.5  CL 105  --  99 97* 96*  CO2 20*  --  23 23 25   GLUCOSE 178*  --  210* 218* 196*  BUN 63*  --  82* 85* 92*  CREATININE 4.21* 4.58* 4.47* 4.68* 4.77*  CALCIUM 7.8*  --  8.5* 8.6* 9.1  MG 2.2  --   --   --  2.7*  PHOS  --   --   --   --  5.0*   Liver Function Tests: Recent Labs  Lab 03/24/20 0357  AST 20  ALT 18  ALKPHOS 72  BILITOT 0.5  PROT 7.7  ALBUMIN 3.8   No results for input(s): LIPASE, AMYLASE in the last 168 hours. No results for input(s): AMMONIA in the last 168 hours. Cardiac Enzymes: No results for input(s): CKTOTAL, CKMB, CKMBINDEX, TROPONINI in the last 168 hours.  BNP (last 3 results) Recent Labs    02/13/20 0033 03/20/20 0620  BNP 713.3* 503.2*    ProBNP (last 3 results) No results for input(s): PROBNP in the last 8760 hours.  CBG: Recent Labs  Lab 03/24/20 0805 03/24/20 1215 03/24/20 1639 03/24/20 2233 03/25/20 0817  GLUCAP 173* 188* 182* 207* 132*   Recent Results (from the past 240 hour(s))  SARS Coronavirus 2 by RT PCR (hospital order, performed in [facility] hospital lab) Nasopharyngeal Nasopharyngeal Swab     Status: None   Collection Time: 03/20/20  7:28 AM   Specimen: Nasopharyngeal Swab  Result Value Ref Range Status   SARS Coronavirus 2 NEGATIVE NEGATIVE Final    Comment: (NOTE) SARS-CoV-2 target nucleic acids are NOT DETECTED.  The SARS-CoV-2 RNA is generally detectable in upper and lower respiratory specimens during the acute phase of infection. The lowest concentration of SARS-CoV-2 viral copies this assay can detect is 250 copies / mL. A negative result does not preclude SARS-CoV-2 infection and should not be used as the sole basis for treatment or other patient management decisions.  A negative result may occur with improper specimen collection / handling, submission of specimen other than nasopharyngeal swab, presence of viral mutation(s) within the areas  targeted by this assay, and inadequate number of viral copies (<250 copies / mL). A negative result must be combined with clinical observations, patient history, and epidemiological information.  Fact Sheet for Patients:   StrictlyIdeas.no  Fact Sheet for Healthcare Providers: BankingDealers.co.za  This test is not yet approved or  cleared by the [place] FDA and has been authorized for detection and/or diagnosis of SARS-CoV-2 by FDA under an Emergency Use Authorization (EUA).  This EUA will remain in effect (meaning this test can be used) for the duration of the COVID-19 declaration under Section 564(b)(1) of the Act, 21 U.S.C. section 360bbb-3(b)(1), unless the authorization is terminated or revoked sooner.  Performed at [facility], [address] [place], [place] [address]      Studies: CARDIAC CATHETERIZATION  Result Date: 03/23/2020  Ost RCA to Prox RCA lesion is 99% stenosed.  Post intervention, there is a 0% residual stenosis.  A drug-eluting stent was successfully placed using a STENT RESOLUTE ONYX 4.0X18.  Successful angioplasty and drug-eluting stent placement to the ostial right coronary artery.  60 mL of contrast was used. Recommendations: Continue dual antiplatelet therapy for at least 6 months. The patient can undergo endoscopic GI procedures from a cardiac standpoint at an overall moderate risk.  Although his aortic stenosis is severe, it does not seem to be critical. Maintain a hemoglobin above 8. The patient will likely require starting dialysis very soon in order to facilitate continued evaluation for TAVR. The patient is still volume overloaded and his LVEDP was 31 mmHg.  I changed torsemide to 40 mg twice daily. I discontinued heparin drip and nitroglycerin drip.      Flora Lipps, MD  [facility] 03/25/2020

## 2020-03-25 NOTE — Progress Notes (Signed)
CARDIAC REHAB PHASE I   PRE:  Rate/Rhythm: 77 SR  BP:  Supine:   Sitting: 122/64  Standing:    SaO2: 97%RA  MODE:  Ambulation: 250 ft   POST:  Rate/Rhythm: 85 SR  BP:  Supine:   Sitting: 125/75  Standing:    SaO2: 94%RA 4199-1444 Pt walked 250 ft on RA with his cane and minimal asst. Stopped twice to rest due to neuropathy. No c/o CP or SOB. Back to sitting on side of bed after walk.   Graylon Good, RN BSN  03/25/2020 9:45 AM

## 2020-03-25 NOTE — Discharge Instructions (Signed)
Medications Changes: - Continue Plavix 75mg  once daily in addition to Aspirin 81mg  once daily. These medications are very important and help keep your stent open. - START Hydralazine 25mg  three times daily.  - STOP Simvastatin and START Rosuvastatin (Crestor) 10mg  daily for your cholesterol. - STOP Amlodipine. - Continue taking home insulin as you have been. - Please take all other medications as directed elsewhere on discharge summary.  **Please call your primary Nephrologist as soon as possible to schedule follow-up appointment. You need repeat labs drawn (CBC and BMET) within one week and ultimately need to get dialysis access.**  Post NSTEMI: NO HEAVY LIFTING X 2 WEEKS. NO SEXUAL ACTIVITY X 2 WEEKS. NO DRIVING X 1 WEEK. NO SOAKING BATHS, HOT TUBS, POOLS, ETC., X 7 DAYS.  Radial Site Care: Refer to this sheet in the next few weeks. These instructions provide you with information on caring for yourself after your procedure. Your caregiver may also give you more specific instructions. Your treatment has been planned according to current medical practices, but problems sometimes occur. Call your caregiver if you have any problems or questions after your procedure. HOME CARE INSTRUCTIONS  You may shower the day after the procedure.Remove the bandage (dressing) and gently wash the site with plain soap and water.Gently pat the site dry.   Do not apply powder or lotion to the site.   Do not submerge the affected site in water for 3 to 5 days.   Inspect the site at least twice daily.   Do not flex or bend the affected arm for 24 hours.   No lifting over 5 pounds (2.3 kg) for 5 days after your procedure.   Do not drive home if you are discharged the same day of the procedure. Have someone else drive you.  What to expect:  Any bruising will usually fade within 1 to 2 weeks.   Blood that collects in the tissue (hematoma) may be painful to the touch. It should usually decrease in size  and tenderness within 1 to 2 weeks.  SEEK IMMEDIATE MEDICAL CARE IF:  You have unusual pain at the radial site.   You have redness, warmth, swelling, or pain at the radial site.   You have drainage (other than a small amount of blood on the dressing).   You have chills.   You have a fever or persistent symptoms for more than 72 hours.   You have a fever and your symptoms suddenly get worse.   Your arm becomes pale, cool, tingly, or numb.   You have heavy bleeding from the site. Hold pressure on the site.   Groin Site Care: Refer to this sheet in the next few weeks. These instructions provide you with information on caring for yourself after your procedure. Your caregiver may also give you more specific instructions. Your treatment has been planned according to current medical practices, but problems sometimes occur. Call your caregiver if you have any problems or questions after your procedure. HOME CARE INSTRUCTIONS  You may shower 24 hours after the procedure. Remove the bandage (dressing) and gently wash the site with plain soap and water. Gently pat the site dry.   Do not apply powder or lotion to the site.   Do not sit in a bathtub, swimming pool, or whirlpool for 5 to 7 days.   No bending, squatting, or lifting anything over 10 pounds (4.5 kg) as directed by your caregiver.   Inspect the site at least twice daily.  Do not drive home if you are discharged the same day of the procedure. Have someone else drive you.  What to expect:  Any bruising will usually fade within 1 to 2 weeks.   Blood that collects in the tissue (hematoma) may be painful to the touch. It should usually decrease in size and tenderness within 1 to 2 weeks.  SEEK IMMEDIATE MEDICAL CARE IF:  You have unusual pain at the groin site or down the affected leg.   You have redness, warmth, swelling, or pain at the groin site.   You have drainage (other than a small amount of blood on the dressing).    You have chills.   You have a fever or persistent symptoms for more than 72 hours.   You have a fever and your symptoms suddenly get worse.   Your leg becomes pale, cool, tingly, or numb. You have heavy bleeding from the site. Hold pressure on the site.   Heart Failure Education: 1. Weigh yourself EVERY morning after you go to the bathroom but before you eat or drink anything. Write this number down in a weight log/diary. If you gain 3 pounds overnight or 5 pounds in a week, call the office. 2. Take your medicines as prescribed. If you have concerns about your medications, please call us before you stop taking them.  3. Eat low salt foods--Limit salt (sodium) to 2000 mg per day. This will help prevent your body from holding onto fluid. Read food labels as many processed foods have a lot of sodium, especially canned goods and prepackaged meats. If you would like some assistance choosing low sodium foods, we would be happy to set you up with a nutritionist. 4. Stay as active as you can everyday. Staying active will give you more energy and make your muscles stronger. Start with 5 minutes at a time and work your way up to 30 minutes a day. Break up your activities--do some in the morning and some in the afternoon. Start with 3 days per week and work your way up to 5 days as you can.  If you have chest pain, feel short of breath, dizzy, or lightheaded, STOP. If you don't feel better after a short rest, call 911. If you do feel better, call the office to let us know you have symptoms with exercise. 5. Limit all fluids for the day to less than 2 liters. Fluid includes all drinks, coffee, juice, ice chips, soup, jello, and all other liquids.

## 2020-03-29 ENCOUNTER — Other Ambulatory Visit: Payer: Self-pay | Admitting: *Deleted

## 2020-03-29 ENCOUNTER — Telehealth: Payer: Self-pay | Admitting: Cardiovascular Disease

## 2020-03-29 ENCOUNTER — Other Ambulatory Visit: Payer: Self-pay

## 2020-03-29 ENCOUNTER — Encounter: Payer: Self-pay | Admitting: Family

## 2020-03-29 ENCOUNTER — Telehealth: Payer: Self-pay | Admitting: Family

## 2020-03-29 ENCOUNTER — Ambulatory Visit (INDEPENDENT_AMBULATORY_CARE_PROVIDER_SITE_OTHER): Payer: Medicare Other | Admitting: Family

## 2020-03-29 VITALS — BP 140/66 | HR 81 | Ht 66.0 in | Wt 237.0 lb

## 2020-03-29 DIAGNOSIS — I350 Nonrheumatic aortic (valve) stenosis: Secondary | ICD-10-CM

## 2020-03-29 DIAGNOSIS — I5042 Chronic combined systolic (congestive) and diastolic (congestive) heart failure: Secondary | ICD-10-CM | POA: Diagnosis not present

## 2020-03-29 DIAGNOSIS — I25118 Atherosclerotic heart disease of native coronary artery with other forms of angina pectoris: Secondary | ICD-10-CM | POA: Diagnosis not present

## 2020-03-29 DIAGNOSIS — N184 Chronic kidney disease, stage 4 (severe): Secondary | ICD-10-CM | POA: Diagnosis not present

## 2020-03-29 DIAGNOSIS — D649 Anemia, unspecified: Secondary | ICD-10-CM

## 2020-03-29 DIAGNOSIS — I5032 Chronic diastolic (congestive) heart failure: Secondary | ICD-10-CM

## 2020-03-29 NOTE — Telephone Encounter (Signed)
Patient being seen sooner 03/29/20. See 03/29/20 telephone encounter.  closing TOC encounter.

## 2020-03-29 NOTE — Telephone Encounter (Signed)
Returned the patients call. Lmtcb.  Patient could be seen this afternoon by [name], NP if agreeable.

## 2020-03-29 NOTE — Telephone Encounter (Signed)
[name] [name] is calling as requested by patient. Patient wants nursing home care and wants [facility] to do. [name] [name] states he sent a message to Dr. Fletcher Anon and his nurse regarding this because he wants the order asap so the patieny can receive care tomorrow  Call back info for [name] [name] is [phone]  Patient was seen today by [name]

## 2020-03-29 NOTE — Telephone Encounter (Addendum)
Spoke with Corene Cornea with [facility]. Daymon Larsen that Dr. Fletcher Anon is in agreement with continuing the patients [facility] services for nursing. Referral for [facility] nursing services placed and is in [place].

## 2020-03-29 NOTE — Patient Outreach (Signed)
[facility]) Care Management  03/29/2020  [name] [name] 09-16-1955 [id number]   Telephone Assessment-Successful Post op hospital discharge 9/3  RN spoke with pt who recently discharged from the hospital on 9/3. Provider to completed transition of care call. Pt reports he has been SOB while inpt and this symptoms continues now that he is home (uses an inhaler). States he has not weighed today but continues to eliminate fluid with several trips to the bathroom throughout the night. Can't sleep only 2 hours last night and getting ready to "lay back down". Pt with no wheezing or congestion and able to conversant with no distress noted at this time. Pt states he is sitting down at this time. Pt states he was getting ready to contact his provider to report the ongoing above symptoms.   RN encouraged pt to report any exacerbated symptoms that are sustaining distressful breathing however it is very importance to weigh if pt has fluid retention occurring.  Will inquired on daily weights as pt reports he has not weighted this morning does not feel he has gained any fluid at this time.  Will stress again the importance of daily weights and what to do if acute symptoms are present (pt with understanding).    Will interate on the current plan of care in place and encourage adherence. No other acute needs at this time.  Raina Mina, RN Care Management Coordinator [place] Office [phone]

## 2020-03-29 NOTE — Progress Notes (Signed)
Error

## 2020-03-29 NOTE — Telephone Encounter (Signed)
Patient states since he was discharged from the hospital, he states his breathing has gotten "bad". States he wakes up coughing.  Pt c/o Shortness Of Breath: STAT if SOB developed within the last 24 hours or pt is noticeably SOB on the phone  1. Are you currently SOB (can you hear that pt is SOB on the phone)? somewhat  2. How long have you been experiencing SOB? For a while  3. Are you SOB when sitting or when up moving around? All the time  4. Are you currently experiencing any other symptoms? No other symptoms, just feel like someone has knocked the wind out of him.

## 2020-03-29 NOTE — Patient Instructions (Addendum)
Medication Instructions:  Your physician has recommended you make the following change in your medication:   CHANGE Torsemide to 80mg  daily in the morning  (take 2 of your 40mg  tablets together)  *If you need a refill on your cardiac medications before your next appointment, please call your pharmacy*   Lab Work: Your physician recommends that you return for lab work on Friday, September 10th in the morning at the [facility] for BMP and CBC. You do not need an appointment and you do not need to be fasting.  If you have labs (blood work) drawn today and your tests are completely normal, you will receive your results only by: [name] MyChart Message (if you have MyChart) OR . A paper copy in the mail If you have any lab test that is abnormal or we need to change your treatment, we will call you to review the results.   Testing/Procedures: Your EKG today was stable.   Follow-Up: At [facility], you and your health needs are our priority.  As part of our continuing mission to provide you with exceptional heart care, we have created designated Provider Care Teams.  These Care Teams include your primary Cardiologist (physician) and Advanced Practice Providers (APPs -  Physician Assistants and Nurse Practitioners) who all work together to provide you with the care you need, when you need it.  We recommend signing up for the patient portal called "MyChart".  Sign up information is provided on this After Visit Summary.  MyChart is used to connect with patients for Virtual Visits (Telemedicine).  Patients are able to view lab/test results, encounter notes, upcoming appointments, etc.  Non-urgent messages can be sent to your provider as well.   To learn more about what you can do with MyChart, go to [email or web address].    Your next appointment:   2 week(s)  The format for your next appointment:   In Person  Provider:    You may see [name], MD or one of the following Advanced  Practice Providers on your designated Care Team:    Murray Hodgkins, NP  Christell Faith, PA-C  Marrianne Mood, PA-C  [name], NP  Other Instructions  Keep your appointment with Dr. [name] (nephrology) next week. We will forward your office note from today to him. Discuss access for dialysis.   We will need to discuss CT of your chest and abdomen/pelvis in anticipation of replacing your aortic valve. We will wait to perform these tests until you meet with Dr. [name].   Your shortness of breath is likely from multiple things including anemia, aortic valve stiffening, heart failure. The increased fluid pill will hopefully help.

## 2020-03-29 NOTE — Telephone Encounter (Signed)
Patient is scheduled today @ 3:30pm with [name], Np.

## 2020-03-29 NOTE — Progress Notes (Signed)
Office Visit    Patient Name: [name] [name] Date of Encounter: 03/29/2020  Primary Care Provider:  [name], MD Primary Cardiologist:  [name] Sacramento, MD Electrophysiologist:  None   Chief Complaint    [name] [name] is a 64 y.o. male with a hx of CAD s/p CABG 2012 s/p DES RCA 03/2020, severe aortic stenosis, carotid artery disease s/p left CEA, Dm2, CKD IV, anemia of chronic disease, HTN, HLD, PAD s/p extensive lower extremity revascularization by vascular surgery 05/2019, tobacco use, obesity, psoriasis presents today for shortness of breath and cough.   Past Medical History    Past Medical History:  Diagnosis Date  . 3-vessel CAD    8/30 LHC with radial graft to RCA occluded at the ostium with critical stenosis of the ostial native RCA.  . Adenomatous colon polyp   . Allergy   . Anxiety   . Arthritis    RA  . Bell's palsy 2007  . CAD (coronary artery disease)    03/21/2020 LHC with patent LIMA to LAD and SVG to OM 3.  Radial graft to RCA occluded at the ostium with critical stenosis of the ostial and native RCA.  . Carotid artery occlusion    Bilateral carotid stenosis s/p left-sided CEA.  . Cataract    Dr. Dawna [name]  . CKD (chronic kidney disease), stage IV ([place])    03/2020 vascular surgery access planned  . Depression   . Diabetes mellitus   . Diverticulosis   . Duodenitis   . DVT (deep venous thrombosis) (H[facility])    in leg  . ESRD (end stage renal disease) ([place])   . Gastropathy   . GERD (gastroesophageal reflux disease)   . Heart failure with preserved ejection fraction ([place])    8/30 RHC with moderately to severely elevated filling pressures and pulmonary wedge pressure 31 mmHg, moderate pulmonary hypertension  . Helicobacter pylori gastritis   . Hx of CABG    2012  . Hyperlipidemia   . Hypertension   . Mitral regurgitation and aortic stenosis    RHC 8/30 and echo 12/2019.  [name] Neuropathy   . Obesity   . Peripheral vascular disease ([place])    S/p  extensive revascularization by vascular surgery 05/2019  . Post splenectomy syndrome   . Psoriasis   . Severe aortic stenosis    03/21/20 RHC with peak gradient 27.4 mmHg and valve area 0.87.  [name] Sleep apnea    uses cpap  . Stroke ([place])   . Tobacco use disorder    recently quit  . Tubular adenoma 08/2013   Dr. Hilarie [name]  . Weak urinary stream    Past Surgical History:  Procedure Laterality Date  . CARDIAC CATHETERIZATION  08/2010   LAD: 80% ISR, RCA: 80% ostial, OM 80-90%  . CAROTID ENDARTERECTOMY  08/2010   left/ Dr. Kellie [name]  . CATARACT EXTRACTION W/PHACO Left 07/09/2017   Procedure: CATARACT EXTRACTION PHACO AND INTRAOCULAR LENS PLACEMENT (IOC);  Surgeon: [name] Robson, MD;  Location: [facility] ORS;  Service: Ophthalmology;  Laterality: Left;  [place] 00:23 AP% 14.2 CDE 3.26 Fluid pack lot # [id number]  . CATARACT EXTRACTION W/PHACO Right 09/10/2017   Procedure: CATARACT EXTRACTION PHACO AND INTRAOCULAR LENS PLACEMENT (IOC);  Surgeon: [name] Robson, MD;  Location: [facility] ORS;  Service: Ophthalmology;  Laterality: Right;  [place] 00:26.4 AP% 17.3 CDE 4.59 Fluid Pack Lot # [id number]  . COLONOSCOPY    . CORONARY ANGIOPLASTY     LAD: before CABG  . CORONARY ARTERY  BYPASS GRAFT  09/06/2010   At [facility]: LIMA to LAD, left radial to RCA, sequential SVG to OM3 and 4  . CORONARY STENT INTERVENTION N/A 03/23/2020   Procedure: CORONARY STENT INTERVENTION;  Surgeon: [name] Hampshire, MD;  Location: [place] [facility];  Service: Cardiovascular;  Laterality: N/A;  RCA  . heart stents  Jan 2011   leg stents 06/2009 and 03/2010  . LOWER EXTREMITY ANGIOGRAPHY Left 06/09/2019   Procedure: LOWER EXTREMITY ANGIOGRAPHY;  Surgeon: [name] Cabal, MD;  Location: [place] [facility];  Service: Cardiovascular;  Laterality: Left;  . PTA of illiac and SFA  multiple   [name] [name], s/p revision 11/2012  . RIGHT AND LEFT HEART CATH N/A 03/21/2020   Procedure: RIGHT AND LEFT HEART CATH possible percutaneous intervention;   Surgeon: [name] Hampshire, MD;  Location: [place] [facility];  Service: Cardiovascular;  Laterality: N/A;  . RIGHT/LEFT HEART CATH AND CORONARY/GRAFT ANGIOGRAPHY N/A 02/15/2020   Procedure: RIGHT/LEFT HEART CATH AND CORONARY/GRAFT ANGIOGRAPHY;  Surgeon: [name] Hampshire, MD;  Location: [place] [facility];  Service: Cardiovascular;  Laterality: N/A;  . SPLENECTOMY    . VASCULAR SURGERY     LEG STENTS    Allergies  Allergies  Allergen Reactions  . Contrast Media [Iodinated Diagnostic Agents] Rash and Other (See Comments)    Got very hot and red   . Glipizide Other (See Comments)    ANTIDIABETICS. Burning  . Hydroxychloroquine Other (See Comments)    Stomach upset  . Metrizamide Other (See Comments)    Got very hot and red  . Penicillins Hives and Swelling    Has patient had a PCN reaction causing immediate rash, facial/tongue/throat swelling, SOB or lightheadedness with hypotension: yes Has patient had a PCN reaction causing severe rash involving mucus membranes or skin necrosis: no  Has patient had a PCN reaction that required hospitalization: yes Has patient had a PCN reaction occurring within the last 10 years: no If all of the above answers are "NO", then may proceed with Cephalosporin use.     History of Present Illness    [name] is a 64 y.o. male with a hx of CAD s/p CABG 2012 s/p DES RCA 03/2020, severe aortic stenosis, carotid artery disease s/p left CEA, Dm2, CKD IV, anemia of chronic disease, HTN, HLD, PAD s/p extensive lower extremity revascularization by vascular surgery 05/2019, tobacco use, obesity, psoriasis. He was last seen for coronary stent intervention 03/23/20.  Ischemic evaluation in 2013 with no evidence of ischemia. Echo 12/2018 with normal LVEF, moderate aortic stenosis (1.28 cm and a mean gradient of 20 mmHg and a mildly dilated aortic root at 4 cm). Seen by [name] [name] 10/2019 doing well. Continued to cope with the passing of his wife from  pancreatic cancer. His renal function continued to decline and he follows with nephrology. Echo 12/2019 EF 50-55%, no RWMA, mild LVH, gr2DD, RN normal size/function, mildlly dilated LA, mild to moderate MR, moderate AS with valve area of 1.06 cm, and a mean gradient of 26 mm.  Seen in ED 02/08/20 with right sided flank pain. CT renal stone protocol with no acute findings. Admitted to [facility] 02/13/20 with NSTEMI HS-TN peak at 470. Plan for [facility] though aborted due to respiratory status and decline in renal function. Seen 02/23/20 in follow up and doing well. Seen in clinic 03/11/20 noting fatigue, exertional angina. He was scheduled for [facility] though this had to be cancelled due to worsening anemia, he was  referred to hematology.   Subsequently admitted to [facility] 03/20/20 for unstable angina, symptomatic dyspnea on exertion.  Underwent cardiac catheterization showing significant three-vessel coronary disease with occluded radial graft to RCA at the ostium with critical stenosis of ostial native RCA.  He was transferred to [facility] for RCA PCI and evaluation for TAVR. 03/23/20 he underwent coronary stent intervention with [name] [name]. A DES was placed to ost-prox RCA which was 99% stenosed. He was recommended for DAPT for 6 months. His aortic stenosis was noted to be severe, but not critical. Recommended to maintain Hb >8.  He required 1 unit of PRBC during admission.  LVEF 76mmHg, torsemide changed to 40mg  BID.  He underwent evaluation by structural heart team but due to significant CKD and no dialysis access it was recommended he undergo evaluation by outpatient nephrology then return for evaluation of severe stenosis.  He called the office this morning noting shortness of breath since hospital discharge and cough. Weight at home 229-230 pounds. Has taken Lasix 20mg  at home twice since discharge. Has not taken extra Torsemide. Discharged 03/25/20 234.5 pounds weight today 237 lb. Endorses frequent urination. Notes marked  DOE, shortness of breath at rest, orthopnea.   1-2 cups of coffee in the morning. He has been drinking zero sugar Seven Up and water. Tells me his whole life he has "held fluids". Discussed fluid restriction.  He is somewhat agitated at the suggestion of increasing his fluid pill. Feels he goes to the bathroom "all day long".   Tells me he is unclear the plan for Dr. [name] and [name] [name].  He has not rescheduled his vascular access. Has appt with nephrology next week. Long discussion regarding the plan of care including establishing vascular access, CT chest, CT abd/pelvis, further evaluation by TAVR team. We discussed that priority today is to get him euvolemic.  EKGs/Labs/Other Studies Reviewed:   The following studies were reviewed today:  Coronary stent intervention 03/23/20  Ost RCA to Prox RCA lesion is 99% stenosed.  Post intervention, there is a 0% residual stenosis.  A drug-eluting stent was successfully placed using a STENT RESOLUTE ONYX 4.0X18.   Successful angioplasty and drug-eluting stent placement to the ostial right coronary artery.  60 mL of contrast was used.   Recommendations: Continue dual antiplatelet therapy for at least 6 months. The patient can undergo endoscopic GI procedures from a cardiac standpoint at an overall moderate risk.  Although his aortic stenosis is severe, it does not seem to be critical.  Maintain a hemoglobin above 8. The patient will likely require starting dialysis very soon in order to facilitate continued evaluation for TAVR. The patient is still volume overloaded and his LVEDP was 31 mmHg.  I changed torsemide to 40 mg twice daily. I discontinued heparin drip and nitroglycerin drip.  Echo 03/22/20 1. Left ventricular ejection fraction, by estimation, is 30-35%. The left  ventricle has moderately decreased function. The left ventricle  demonstrates regional wall motion abnormalities, basal to mid inferior and  inferoseptal akinesis, basal  to mid  inferolateral severe hypokinesis. The left ventricular internal cavity  size was mildly dilated. There is mild left ventricular hypertrophy. Left  ventricular diastolic parameters are consistent with Grade II diastolic  dysfunction (pseudonormalization).   2. Right ventricular systolic function is moderately reduced. The right  ventricular size is normal. There is mildly elevated pulmonary artery  systolic pressure. The estimated right ventricular systolic pressure is  19.3 mmHg.   3. Left atrial size was moderately  dilated.   4. The mitral valve is degenerative. Moderate to severe mitral valve  regurgitation. No evidence of mitral stenosis.   5. The aortic valve is tricuspid. Aortic valve regurgitation is mild.  Moderate aortic valve stenosis. Aortic valve area, by VTI measures 1.08  cm. Aortic valve mean gradient measures 22.0 mmHg.   6. Aortic dilatation noted. There is mild dilatation of the ascending  aorta measuring 42 mm.   7. The inferior vena cava is dilated in size with >50% respiratory  variability, suggesting right atrial pressure of 8 mmHg.   [facility] 03/21/20  Ost Cx to Prox Cx lesion is 85% stenosed.  Mid Cx to Dist Cx lesion is 100% stenosed.  1st Mrg lesion is 70% stenosed.  Prox LAD to Mid LAD lesion is 90% stenosed.  Ost RCA to Prox RCA lesion is 99% stenosed.  SVG.  The graft exhibits mild diffuse disease.  Left radial artery graft was visualized by angiography.  Origin lesion is 100% stenosed.  LIMA graft was visualized by non-selective angiography and is normal in caliber.  The graft exhibits no disease.   1.  Significant underlying three-vessel coronary artery disease with patent LIMA to LAD and SVG to OM 3.  Radial graft to RCA is occluded at the ostium with critical stenosis of the ostial native right coronary artery.  This is the likely culprit for recent non-STEMI and current unstable angina. 2.  Severe aortic stenosis with peak gradient of  27.4 mmHg and valve area of 0.87. 3.  Right heart catheterization showed moderately to severely elevated filling pressures with pulmonary wedge pressure of 31 mmHg, moderate pulmonary hypertension at 50/30 mmHg and normal cardiac output at 5.06 with a cardiac index of 2.36.  Prominent V waves on wedge pressure tracing suggestive of significant mitral regurgitation.   Recommendations: This is an overall difficult case.  The patient will need to be evaluated for TAVR plus RCA PCI.  Advanced chronic kidney disease and anemia in addition to significant heart failure make timing of this difficult.  The patient will likely need to be initiated on hemodialysis in order to improve his volume status.  He needs blood transfusion to keep his hemoglobin above 9.  Nonetheless, he might not be able to tolerate large volume shifts due to significant coronary artery disease and aortic stenosis. I will discuss with our structural heart team.  It might be best to transfer the patient to [facility]. Vascular access on this patient is very difficult due to extensive stenting and calcifications of the lower extremities.  The SFA stent on the right side might be extending into the common femoral artery.  Arterial access might be easier via the left femoral artery.  EKG:  EKG is  ordered today.  The ekg ordered today demonstrates SR 81 bpm with 1st degree AV block, LBBB. Previous TWI in lateral leads have improved compared to last tracing.   Recent Labs: 03/20/2020: B Natriuretic Peptide 503.2 03/24/2020: ALT 18 03/25/2020: BUN 93; Creatinine, Ser 5.19; Hemoglobin 8.8; Magnesium 2.8; Platelets 141; Potassium 4.1; Sodium 133  Recent Lipid Panel    Component Value Date/Time   CHOL 122 02/13/2020 0525   CHOL 116 11/18/2017 1002   TRIG 163 (H) 02/13/2020 0525   HDL 33 (L) 02/13/2020 0525   HDL 30 (L) 11/18/2017 1002   CHOLHDL 3.7 02/13/2020 0525   VLDL 33 02/13/2020 0525   LDLCALC 56 02/13/2020 0525   LDLCALC 51  11/18/2017 1002   LDLDIRECT 61.0  03/15/2017 1021    Home Medications   No outpatient medications have been marked as taking for the 03/29/20 encounter (Appointment) with Loel Dubonnet, NP.    Review of Systems       Review of Systems  Constitutional: Positive for malaise/fatigue. Negative for chills and fever.  Cardiovascular: Positive for dyspnea on exertion, leg swelling and orthopnea. Negative for chest pain, irregular heartbeat, near-syncope, palpitations and syncope.  Respiratory: Positive for shortness of breath. Negative for cough and wheezing.   Gastrointestinal: Negative for melena, nausea and vomiting.  Genitourinary: Negative for hematuria.  Neurological: Negative for dizziness, light-headedness and weakness.   All other systems reviewed and are otherwise negative except as noted above.  Physical Exam    VS:  There were no vitals taken for this visit. , BMI There is no height or weight on file to calculate BMI. GEN: Well nourished, overweight,  well developed, in no acute distress. HEENT: normal. Neck: Supple, no JVD, carotid bruits, or masses. Cardiac: RRR, no  rubs, or gallops. TW6/5 systolic murmur. No clubbing, cyanosis. Nonpitting pedal edema.  Radials/DP/PT 2+ and equal bilaterally.  Respiratory:  Respirations regular and unlabored, clear to auscultation bilaterally. GI: Soft, nontender. Abdominal bloating. MS: No deformity or atrophy. Skin: Warm and dry, no rash. R radial cath site clean, dry, intact without ecchymosis, hematoma, nor infection.  Neuro:  Strength and sensation are intact. Psych: Normal affect.  Assessment & Plan    1. CAD - s/p CABG 2012 and DES RCA 03/2020. R radial cath site healing appropriately. Tells me chest pain has resolved. Recommended for DAPT for at least 6 months. Denies bleeding complications. GDMT includes aspirin, plavix, imdur, statin, coreg.   2. HFpEF/pulmonary hypertension -Echo 03/22/2020 EF down from 50 to 65% to 30-35%  with moderate aortic stenosis.  Up 3 pounds from hospital discharge. Endorses orthopnea, SOB, DOE. Discussed that his DOE is likely multifactorial CAD, HFpEF, pulmonary hypertension, deconditioning, anemia.  Increase Torsemide to 80mg  daily. BMP/CBC on Friday. Plan of care discussed with [name] [name] in clinic. He has appt with nephrology next week which will be imperative for assistance with diuresis in the setting of CKD IV.  3. Aortic stenosis -Echo 03/22/2020 with moderate stenosis.  However, felt to be severe on 03/22/2019 with peak gradient 27.4 mmHg and valve area 0.87.  Seen by Dr. Angelena Form structural heart team during admission 03/21/2020 with recommendation to continue TAVR work-up as outpatient after dialysis access obtained. Plan to obtain more euvolemic status with diuresis, as above. Plan for vascular access, to be discussed with nephrology at upcoming appt. Then will need to set up cardiac CT and CT abd/pelvis. Plan to facilitate scheduling of CT at follow up if volume status improved. Reviewed recommendation to proceed to ED if his dyspnea does not improve with increased diuresis.   4. CKD IV - Upcoming appt with nephrology. Will need dialysis access. Still making urine at this time.   5. HTN - BP well controlled. Continue current antihypertensive regimen.   6. HLD, LDL goal <70 -01/2020 lipid panel total cholesterol 122, triglycerides 163, HDL 33, LDL 56.  During admission simvastatin 20 mg daily transition to Crestor 10 mg daily. Tolerating well. Plan for repeat lipid/liver panel in 6 weeks.   7. Carotid artery disease s/p left CEA - Continue aspirin, statin, plavix.   8. Anemia of chronic disease - Reports no bleeding complications. Contributory to dyspnea. Encouraged dietary sources of iron. CBC Friday.   9. OSA - CPAP compliance  encouraged.    Disposition: Follow up in 2 week(s) with [name] [name] or APP.   Loel Dubonnet, NP 03/29/2020, 10:16 AM

## 2020-03-30 DIAGNOSIS — D126 Benign neoplasm of colon, unspecified: Secondary | ICD-10-CM | POA: Diagnosis not present

## 2020-03-30 DIAGNOSIS — F329 Major depressive disorder, single episode, unspecified: Secondary | ICD-10-CM | POA: Diagnosis not present

## 2020-03-30 DIAGNOSIS — I130 Hypertensive heart and chronic kidney disease with heart failure and stage 1 through stage 4 chronic kidney disease, or unspecified chronic kidney disease: Secondary | ICD-10-CM | POA: Diagnosis not present

## 2020-03-30 DIAGNOSIS — I272 Pulmonary hypertension, unspecified: Secondary | ICD-10-CM | POA: Diagnosis not present

## 2020-03-30 DIAGNOSIS — G8929 Other chronic pain: Secondary | ICD-10-CM | POA: Diagnosis not present

## 2020-03-30 DIAGNOSIS — K5790 Diverticulosis of intestine, part unspecified, without perforation or abscess without bleeding: Secondary | ICD-10-CM | POA: Diagnosis not present

## 2020-03-30 DIAGNOSIS — E1140 Type 2 diabetes mellitus with diabetic neuropathy, unspecified: Secondary | ICD-10-CM | POA: Diagnosis not present

## 2020-03-30 DIAGNOSIS — E785 Hyperlipidemia, unspecified: Secondary | ICD-10-CM | POA: Diagnosis not present

## 2020-03-30 DIAGNOSIS — F419 Anxiety disorder, unspecified: Secondary | ICD-10-CM | POA: Diagnosis not present

## 2020-03-30 DIAGNOSIS — I080 Rheumatic disorders of both mitral and aortic valves: Secondary | ICD-10-CM | POA: Diagnosis not present

## 2020-03-30 DIAGNOSIS — Z9181 History of falling: Secondary | ICD-10-CM | POA: Diagnosis not present

## 2020-03-30 DIAGNOSIS — G4733 Obstructive sleep apnea (adult) (pediatric): Secondary | ICD-10-CM | POA: Diagnosis not present

## 2020-03-30 DIAGNOSIS — E1122 Type 2 diabetes mellitus with diabetic chronic kidney disease: Secondary | ICD-10-CM | POA: Diagnosis not present

## 2020-03-30 DIAGNOSIS — R1310 Dysphagia, unspecified: Secondary | ICD-10-CM | POA: Diagnosis not present

## 2020-03-30 DIAGNOSIS — K2980 Duodenitis without bleeding: Secondary | ICD-10-CM | POA: Diagnosis not present

## 2020-03-30 DIAGNOSIS — I6523 Occlusion and stenosis of bilateral carotid arteries: Secondary | ICD-10-CM | POA: Diagnosis not present

## 2020-03-30 DIAGNOSIS — Z7982 Long term (current) use of aspirin: Secondary | ICD-10-CM | POA: Diagnosis not present

## 2020-03-30 DIAGNOSIS — M069 Rheumatoid arthritis, unspecified: Secondary | ICD-10-CM | POA: Diagnosis not present

## 2020-03-30 DIAGNOSIS — D631 Anemia in chronic kidney disease: Secondary | ICD-10-CM | POA: Diagnosis not present

## 2020-03-30 DIAGNOSIS — I5022 Chronic systolic (congestive) heart failure: Secondary | ICD-10-CM | POA: Diagnosis not present

## 2020-03-30 DIAGNOSIS — K219 Gastro-esophageal reflux disease without esophagitis: Secondary | ICD-10-CM | POA: Diagnosis not present

## 2020-03-30 DIAGNOSIS — N184 Chronic kidney disease, stage 4 (severe): Secondary | ICD-10-CM | POA: Diagnosis not present

## 2020-03-30 DIAGNOSIS — I25110 Atherosclerotic heart disease of native coronary artery with unstable angina pectoris: Secondary | ICD-10-CM | POA: Diagnosis not present

## 2020-03-30 DIAGNOSIS — L409 Psoriasis, unspecified: Secondary | ICD-10-CM | POA: Diagnosis not present

## 2020-03-30 DIAGNOSIS — E1151 Type 2 diabetes mellitus with diabetic peripheral angiopathy without gangrene: Secondary | ICD-10-CM | POA: Diagnosis not present

## 2020-03-30 DIAGNOSIS — I5042 Chronic combined systolic (congestive) and diastolic (congestive) heart failure: Secondary | ICD-10-CM | POA: Diagnosis not present

## 2020-04-01 ENCOUNTER — Other Ambulatory Visit
Admission: RE | Admit: 2020-04-01 | Discharge: 2020-04-01 | Disposition: A | Discharge status: Home / Self Care | Payer: Medicare Other | Attending: Family | Admitting: Family

## 2020-04-01 ENCOUNTER — Telehealth: Payer: Self-pay

## 2020-04-01 DIAGNOSIS — D649 Anemia, unspecified: Secondary | ICD-10-CM | POA: Diagnosis not present

## 2020-04-01 DIAGNOSIS — I25118 Atherosclerotic heart disease of native coronary artery with other forms of angina pectoris: Secondary | ICD-10-CM | POA: Diagnosis not present

## 2020-04-01 DIAGNOSIS — E1122 Type 2 diabetes mellitus with diabetic chronic kidney disease: Secondary | ICD-10-CM | POA: Diagnosis not present

## 2020-04-01 DIAGNOSIS — I5042 Chronic combined systolic (congestive) and diastolic (congestive) heart failure: Secondary | ICD-10-CM | POA: Diagnosis not present

## 2020-04-01 DIAGNOSIS — I129 Hypertensive chronic kidney disease with stage 1 through stage 4 chronic kidney disease, or unspecified chronic kidney disease: Secondary | ICD-10-CM | POA: Diagnosis not present

## 2020-04-01 DIAGNOSIS — N2581 Secondary hyperparathyroidism of renal origin: Secondary | ICD-10-CM | POA: Diagnosis not present

## 2020-04-01 DIAGNOSIS — I350 Nonrheumatic aortic (valve) stenosis: Secondary | ICD-10-CM | POA: Diagnosis not present

## 2020-04-01 DIAGNOSIS — N184 Chronic kidney disease, stage 4 (severe): Secondary | ICD-10-CM | POA: Insufficient documentation

## 2020-04-01 DIAGNOSIS — R809 Proteinuria, unspecified: Secondary | ICD-10-CM | POA: Diagnosis not present

## 2020-04-01 LAB — BASIC METABOLIC PANEL
Anion gap: 11 (ref 5–15)
BUN: 59 mg/dL — ABNORMAL HIGH (ref 8–23)
CO2: 27 mmol/L (ref 22–32)
Calcium: 8.3 mg/dL — ABNORMAL LOW (ref 8.9–10.3)
Chloride: 102 mmol/L (ref 98–111)
Creatinine, Ser: 3.77 mg/dL — ABNORMAL HIGH (ref 0.61–1.24)
GFR calc Af Amer: 18 mL/min — ABNORMAL LOW (ref >60)
GFR calc non Af Amer: 16 mL/min — ABNORMAL LOW (ref >60)
Glucose, Bld: 160 mg/dL — ABNORMAL HIGH (ref 70–99)
Potassium: 4.3 mmol/L (ref 3.5–5.1)
Sodium: 140 mmol/L (ref 135–145)

## 2020-04-01 LAB — CBC WITH DIFFERENTIAL/PLATELET
Abs Immature Granulocytes: 0.03 10*3/uL (ref 0.00–0.07)
Basophils Absolute: 0 10*3/uL (ref 0.0–0.1)
Basophils Relative: 1 %
Eosinophils Absolute: 0.2 10*3/uL (ref 0.0–0.5)
Eosinophils Relative: 3 %
HCT: 29.5 % — ABNORMAL LOW (ref 39.0–52.0)
Hemoglobin: 9.4 g/dL — ABNORMAL LOW (ref 13.0–17.0)
Immature Granulocytes: 0 %
Lymphocytes Relative: 18 %
Lymphs Abs: 1.5 10*3/uL (ref 0.7–4.0)
MCH: 31.3 pg (ref 26.0–34.0)
MCHC: 31.9 g/dL (ref 30.0–36.0)
MCV: 98.3 fL (ref 80.0–100.0)
Monocytes Absolute: 0.8 10*3/uL (ref 0.1–1.0)
Monocytes Relative: 9 %
Neutro Abs: 5.6 10*3/uL (ref 1.7–7.7)
Neutrophils Relative %: 69 %
Platelets: 151 10*3/uL (ref 150–400)
RBC: 3 MIL/uL — ABNORMAL LOW (ref 4.22–5.81)
RDW: 15.9 % — ABNORMAL HIGH (ref 11.5–15.5)
WBC: 8.1 10*3/uL (ref 4.0–10.5)
nRBC: 0 % (ref 0.0–0.2)

## 2020-04-01 NOTE — Telephone Encounter (Signed)
-----   Message from Loel Dubonnet, NP sent at 04/01/2020 10:25 AM EDT ----- Normal potassium. CBC shows no evidence of infection and slight improvement in hemoglobin (anemia). Good result! Kidney function has improved since hospital discharge. Continue Torsemide 80mg  daily. Follow up with nephrology (Dr. [name]) next week as scheduled.   Note to nursing staff: He should see Dr. [name] prior to appt with Thurmond Butts 04/07/20. He is seeing nephrology to discuss HD access so that we can proceed with TAVR workup. If his appt with nephrology is not before this appt, please reschedule. He did not recall when his nephrology appt was in clinic visit but was going to check at home

## 2020-04-01 NOTE — Telephone Encounter (Signed)
Returned the call. Voicemail does not identify a name and does not specify that it is a secured voicemail. Did not leave any patient identifiers in the message. [email or web address].

## 2020-04-01 NOTE — Telephone Encounter (Signed)
Attempted to call patient. LMTCB 04/01/2020

## 2020-04-01 NOTE — Telephone Encounter (Signed)
[name] [name] with [facility] calling  Requesting orders for  Once a week for 1 week Twice a week for 1 week Once a week for 7 weeks and 2 PRN visits Please review and call

## 2020-04-04 NOTE — Telephone Encounter (Signed)
Nurse with home health is calling back to check status of orders. Please return the call.

## 2020-04-04 NOTE — Telephone Encounter (Signed)
Called and s/w [name] [name] and gave her verbal ok to continue home health at the frequency listed below in previous entry. She was appreciative. Routing back to [place] as FYI.

## 2020-04-04 NOTE — Telephone Encounter (Signed)
This encounter was created in error - please disregard.

## 2020-04-05 DIAGNOSIS — I130 Hypertensive heart and chronic kidney disease with heart failure and stage 1 through stage 4 chronic kidney disease, or unspecified chronic kidney disease: Secondary | ICD-10-CM | POA: Diagnosis not present

## 2020-04-05 DIAGNOSIS — E1122 Type 2 diabetes mellitus with diabetic chronic kidney disease: Secondary | ICD-10-CM | POA: Diagnosis not present

## 2020-04-05 DIAGNOSIS — N184 Chronic kidney disease, stage 4 (severe): Secondary | ICD-10-CM | POA: Diagnosis not present

## 2020-04-05 DIAGNOSIS — I080 Rheumatic disorders of both mitral and aortic valves: Secondary | ICD-10-CM | POA: Diagnosis not present

## 2020-04-05 DIAGNOSIS — I5042 Chronic combined systolic (congestive) and diastolic (congestive) heart failure: Secondary | ICD-10-CM | POA: Diagnosis not present

## 2020-04-05 DIAGNOSIS — I25110 Atherosclerotic heart disease of native coronary artery with unstable angina pectoris: Secondary | ICD-10-CM | POA: Diagnosis not present

## 2020-04-05 NOTE — Telephone Encounter (Signed)
Call to patient to review labs.    Pt verbalized understanding and has no further questions at this time.    Advised pt to call for any further questions or concerns.  No further orders.   Pt reports that nephrology appt is scheduled for Thursday am prior to appt with our office.

## 2020-04-07 ENCOUNTER — Encounter: Payer: Self-pay | Admitting: Family

## 2020-04-07 ENCOUNTER — Ambulatory Visit: Payer: Medicare Other | Admitting: Physician Assistant

## 2020-04-07 ENCOUNTER — Ambulatory Visit (INDEPENDENT_AMBULATORY_CARE_PROVIDER_SITE_OTHER): Payer: Medicare Other | Admitting: Family

## 2020-04-07 ENCOUNTER — Other Ambulatory Visit: Payer: Self-pay

## 2020-04-07 VITALS — BP 120/60 | HR 98 | Ht 66.0 in | Wt 234.5 lb

## 2020-04-07 DIAGNOSIS — I200 Unstable angina: Secondary | ICD-10-CM

## 2020-04-07 DIAGNOSIS — E1122 Type 2 diabetes mellitus with diabetic chronic kidney disease: Secondary | ICD-10-CM | POA: Diagnosis not present

## 2020-04-07 DIAGNOSIS — N184 Chronic kidney disease, stage 4 (severe): Secondary | ICD-10-CM | POA: Diagnosis not present

## 2020-04-07 DIAGNOSIS — I25118 Atherosclerotic heart disease of native coronary artery with other forms of angina pectoris: Secondary | ICD-10-CM | POA: Diagnosis not present

## 2020-04-07 DIAGNOSIS — I5042 Chronic combined systolic (congestive) and diastolic (congestive) heart failure: Secondary | ICD-10-CM

## 2020-04-07 DIAGNOSIS — R809 Proteinuria, unspecified: Secondary | ICD-10-CM | POA: Diagnosis not present

## 2020-04-07 DIAGNOSIS — I350 Nonrheumatic aortic (valve) stenosis: Secondary | ICD-10-CM

## 2020-04-07 DIAGNOSIS — N189 Chronic kidney disease, unspecified: Secondary | ICD-10-CM | POA: Insufficient documentation

## 2020-04-07 DIAGNOSIS — D631 Anemia in chronic kidney disease: Secondary | ICD-10-CM | POA: Insufficient documentation

## 2020-04-07 DIAGNOSIS — I129 Hypertensive chronic kidney disease with stage 1 through stage 4 chronic kidney disease, or unspecified chronic kidney disease: Secondary | ICD-10-CM | POA: Diagnosis not present

## 2020-04-07 DIAGNOSIS — N2581 Secondary hyperparathyroidism of renal origin: Secondary | ICD-10-CM | POA: Diagnosis not present

## 2020-04-07 MED ORDER — TORSEMIDE 20 MG PO TABS: 40.0000 mg | ORAL_TABLET | Freq: Two times a day (BID) | ORAL | 1 refills | Status: DC

## 2020-04-07 NOTE — Patient Instructions (Signed)
Medication Instructions:  CONTINUE Torsemide 40mg  (2 tablets) twice daily  You may take your second dose in the early afternoon to prevent you from going to the restroom during the night.   *If you need a refill on your cardiac medications before your next appointment, please call your pharmacy*  Lab Work: No lab work today.   Testing/Procedures: Your EKG today was stable compared to previous.   Follow-Up: At [facility], you and your health needs are our priority.  As part of our continuing mission to provide you with exceptional heart care, we have created designated Provider Care Teams.  These Care Teams include your primary Cardiologist (physician) and Advanced Practice Providers (APPs -  Physician Assistants and Nurse Practitioners) who all work together to provide you with the care you need, when you need it.  We recommend signing up for the patient portal called "MyChart".  Sign up information is provided on this After Visit Summary.  MyChart is used to connect with patients for Virtual Visits (Telemedicine).  Patients are able to view lab/test results, encounter notes, upcoming appointments, etc.  Non-urgent messages can be sent to your provider as well.   To learn more about what you can do with MyChart, go to [email or web address].    Your next appointment:   As previously scheduled with Dr. Fletcher Anon in October.   You have upcoming vein mapping and follow with Dr. Ronalee Belts to plan for dialysis access.   [name], NP will reach out to Dr. Angelena Form who is Dr. Tyrell Antonio partner who does the aortic valve replacements. You will need to have a CT scan done to determine the best way to replace your valve. We will discuss when to perform those CT scans in the setting of all of your scheduling with nephrology and Dr. Ronalee Belts. We will call you to let you know.   Other Instructions  Continue low salt diet.   Continue to drink less than 2 liters of fluid per day.   Call our office  if you notice new or worsening weight gain, shortness of breath, episodes of almost passin gout, or chest pain.

## 2020-04-07 NOTE — Progress Notes (Signed)
Office Visit    Patient Name: [name] [name] Date of Encounter: 04/07/2020  Primary Care Provider:  [name], MD Primary Cardiologist:  [name], MD Electrophysiologist:  None   Chief Complaint    [name] [name] is a 64 y.o. male with a hx of CAD s/p CABG 2012 s/p DES RCA 03/2020, severe aortic stenosis, carotid artery disease s/p left CEA, Dm2, CKD IV, anemia of chronic disease, HTN, HLD, PAD s/p extensive lower extremity revascularization by vascular surgery 05/2019, tobacco use, obesity, psoriasis presents today for follow up of HFpEF and aortic stenosis.  Past Medical History    Past Medical History:  Diagnosis Date  . 3-vessel CAD    8/30 LHC with radial graft to RCA occluded at the ostium with critical stenosis of the ostial native RCA.  . Adenomatous colon polyp   . Allergy   . Anxiety   . Arthritis    RA  . Bell's palsy 2007  . CAD (coronary artery disease)    03/21/2020 LHC with patent LIMA to LAD and SVG to OM 3.  Radial graft to RCA occluded at the ostium with critical stenosis of the ostial and native RCA.  . Carotid artery occlusion    Bilateral carotid stenosis s/p left-sided CEA.  . Cataract    Dr. Dawna Part  . CKD (chronic kidney disease), stage IV ([place])    03/2020 vascular surgery access planned  . Depression   . Diabetes mellitus   . Diverticulosis   . Duodenitis   . DVT (deep venous thrombosis) (HCC)    in leg  . ESRD (end stage renal disease) ([place])   . Gastropathy   . GERD (gastroesophageal reflux disease)   . Heart failure with preserved ejection fraction ([place])    8/30 RHC with moderately to severely elevated filling pressures and pulmonary wedge pressure 31 mmHg, moderate pulmonary hypertension  . Helicobacter pylori gastritis   . Hx of CABG    2012  . Hyperlipidemia   . Hypertension   . Mitral regurgitation and aortic stenosis    RHC 8/30 and echo 12/2019.  [name] Neuropathy   . Obesity   . Peripheral vascular disease ([place])     S/p extensive revascularization by vascular surgery 05/2019  . Post splenectomy syndrome   . Psoriasis   . Severe aortic stenosis    03/21/20 RHC with peak gradient 27.4 mmHg and valve area 0.87.  [name] Sleep apnea    uses cpap  . Stroke ([place])   . Tobacco use disorder    recently quit  . Tubular adenoma 08/2013   Dr. Hilarie Fredrickson  . Weak urinary stream    Past Surgical History:  Procedure Laterality Date  . CARDIAC CATHETERIZATION  08/2010   LAD: 80% ISR, RCA: 80% ostial, OM 80-90%  . CAROTID ENDARTERECTOMY  08/2010   left/ Dr. Kellie Simmering  . CATARACT EXTRACTION W/PHACO Left 07/09/2017   Procedure: CATARACT EXTRACTION PHACO AND INTRAOCULAR LENS PLACEMENT (IOC);  Surgeon: Birder Robson, MD;  Location: [facility];  Service: Ophthalmology;  Laterality: Left;  [place] 00:23 AP% 14.2 CDE 3.26 Fluid pack lot # [id number]  . CATARACT EXTRACTION W/PHACO Right 09/10/2017   Procedure: CATARACT EXTRACTION PHACO AND INTRAOCULAR LENS PLACEMENT (IOC);  Surgeon: Birder Robson, MD;  Location: [facility];  Service: Ophthalmology;  Laterality: Right;  [place] 00:26.4 AP% 17.3 CDE 4.59 Fluid Pack Lot # [id number]  . COLONOSCOPY    . CORONARY ANGIOPLASTY     LAD: before CABG  .  CORONARY ARTERY BYPASS GRAFT  09/06/2010   At [facility]: LIMA to LAD, left radial to RCA, sequential SVG to OM3 and 4  . CORONARY STENT INTERVENTION N/A 03/23/2020   Procedure: CORONARY STENT INTERVENTION;  Surgeon: Wellington Hampshire, MD;  Location: [place] [facility];  Service: Cardiovascular;  Laterality: N/A;  RCA  . heart stents  Jan 2011   leg stents 06/2009 and 03/2010  . LOWER EXTREMITY ANGIOGRAPHY Left 06/09/2019   Procedure: LOWER EXTREMITY ANGIOGRAPHY;  Surgeon: Katha Cabal, MD;  Location: [place] [facility];  Service: Cardiovascular;  Laterality: Left;  . PTA of illiac and SFA  multiple   Dr. Ronalee Belts, s/p revision 11/2012  . RIGHT AND LEFT HEART CATH N/A 03/21/2020   Procedure: RIGHT AND LEFT HEART CATH possible percutaneous  intervention;  Surgeon: Wellington Hampshire, MD;  Location: [place] [facility];  Service: Cardiovascular;  Laterality: N/A;  . RIGHT/LEFT HEART CATH AND CORONARY/GRAFT ANGIOGRAPHY N/A 02/15/2020   Procedure: RIGHT/LEFT HEART CATH AND CORONARY/GRAFT ANGIOGRAPHY;  Surgeon: Wellington Hampshire, MD;  Location: [place] [facility];  Service: Cardiovascular;  Laterality: N/A;  . SPLENECTOMY    . VASCULAR SURGERY     LEG STENTS    Allergies  Allergies  Allergen Reactions  . Contrast Media [Iodinated Diagnostic Agents] Rash and Other (See Comments)    Got very hot and red   . Glipizide Other (See Comments)    ANTIDIABETICS. Burning  . Hydroxychloroquine Other (See Comments)    Stomach upset  . Metrizamide Other (See Comments)    Got very hot and red  . Penicillins Hives and Swelling    Has patient had a PCN reaction causing immediate rash, facial/tongue/throat swelling, SOB or lightheadedness with hypotension: yes Has patient had a PCN reaction causing severe rash involving mucus membranes or skin necrosis: no  Has patient had a PCN reaction that required hospitalization: yes Has patient had a PCN reaction occurring within the last 10 years: no If all of the above answers are "NO", then may proceed with Cephalosporin use.     History of Present Illness    [name] [name] is a 64 y.o. male with a hx of CAD s/p CABG 2012 s/p DES RCA 03/2020, severe aortic stenosis, carotid artery disease s/p left CEA, Dm2, CKD IV, anemia of chronic disease, HTN, HLD, PAD s/p extensive lower extremity revascularization by vascular surgery 05/2019, tobacco use, obesity, psoriasis. He was last seen 03/29/20.   Ischemic evaluation in 2013 with no evidence of ischemia. Echo 12/2018 with normal LVEF, moderate aortic stenosis (1.28 cm and a mean gradient of 20 mmHg and a mildly dilated aortic root at 4 cm). Seen by Dr. Fletcher Anon 10/2019 doing well. Continued to cope with the passing of his wife from pancreatic cancer. His  renal function continued to decline and he follows with nephrology. Echo 12/2019 EF 50-55%, no RWMA, mild LVH, gr2DD, RN normal size/function, mildlly dilated LA, mild to moderate MR, moderate AS with valve area of 1.06 cm, and a mean gradient of 26 mm.  Seen in ED 02/08/20 with right sided flank pain. CT renal stone protocol with no acute findings. Admitted to [facility] 02/13/20 with NSTEMI HS-TN peak at 470. Plan for [facility] though aborted due to respiratory status and decline in renal function. Seen 02/23/20 in follow up and doing well. Seen in clinic 03/11/20 noting fatigue, exertional angina. He was scheduled for [facility] though this had to be cancelled due to worsening anemia, he was referred  to hematology.   Subsequently admitted to [facility] 03/20/20 for unstable angina, symptomatic dyspnea on exertion.  Underwent cardiac catheterization showing significant three-vessel coronary disease with occluded radial graft to RCA at the ostium with critical stenosis of ostial native RCA.  He was transferred to [facility] for RCA PCI and evaluation for TAVR. 03/23/20 he underwent coronary stent intervention with Dr. Fletcher Anon. A DES was placed to ost-prox RCA which was 99% stenosed. He was recommended for DAPT for 6 months. His aortic stenosis was noted to be severe, but not critical. Recommended to maintain Hb >8.  He required 1 unit of PRBC during admission.  LVEF 61mmHg, torsemide changed to 40mg  BID.  He underwent evaluation by structural heart team but due to significant CKD and no dialysis access it was recommended he undergo evaluation by outpatient nephrology then return for evaluation of severe stenosis.  Seen in clinic 03/29/20 after calling the office with increasing shortness of breath and cough since hospital discharge. Hospital discharge weight 234lbs and weight in clinic 237 lbs. Discussed with Dr. Fletcher Anon in clinic and his Torsemide was increased to 80mg  daily. Long discussion regarding the plan of care including establishing  vascular access for HD, CT chest, CT abd/pelvis, further evaluation by TAVR team.   Repeat BMP 04/01/20 creatinine 3.77, GFR 16, K 4.3.   His weight is down 3 lbs from clinic visit last week. He has been taking Torsemide 80mg  in the morning and then 2 tablets after dinner since last seen. We did discuss that he could take his afternoon dose earlier in the afternoon to prevent nocturia. He saw kidney doctor this morning. He has an upcoming appointment for vein mapping with Dr. Ronalee Belts 04/21/20. He has an appointment for bloodwork upcoming with nephrology per his report.   We reviewed his recent stent and need for aortic valve replacement due to severe aortic stenosis. Reports no chest pain, pressure, tightness. Endorses continue dyspnea on exertion and urinary frequent but reports overall he feels better compared to previous.   He does have some difficulty understanding the difference between needing a heart vessel fixed with a stent and a heart valve fixed. Understandably confusing. We discussed the role of our structural team, valve replacement, need for vascular access, plan for CT for preoperative evaluation. He was appreciative of the explanation.   EKGs/Labs/Other Studies Reviewed:   The following studies were reviewed today:  Coronary stent intervention 03/23/20  Ost RCA to Prox RCA lesion is 99% stenosed.  Post intervention, there is a 0% residual stenosis.  A drug-eluting stent was successfully placed using a STENT RESOLUTE ONYX 4.0X18.   Successful angioplasty and drug-eluting stent placement to the ostial right coronary artery.  60 mL of contrast was used.   Recommendations: Continue dual antiplatelet therapy for at least 6 months. The patient can undergo endoscopic GI procedures from a cardiac standpoint at an overall moderate risk.  Although his aortic stenosis is severe, it does not seem to be critical.  Maintain a hemoglobin above 8. The patient will likely require starting  dialysis very soon in order to facilitate continued evaluation for TAVR. The patient is still volume overloaded and his LVEDP was 31 mmHg.  I changed torsemide to 40 mg twice daily. I discontinued heparin drip and nitroglycerin drip.  Echo 03/22/20 1. Left ventricular ejection fraction, by estimation, is 30-35%. The left  ventricle has moderately decreased function. The left ventricle  demonstrates regional wall motion abnormalities, basal to mid inferior and  inferoseptal akinesis, basal  to mid  inferolateral severe hypokinesis. The left ventricular internal cavity  size was mildly dilated. There is mild left ventricular hypertrophy. Left  ventricular diastolic parameters are consistent with Grade II diastolic  dysfunction (pseudonormalization).   2. Right ventricular systolic function is moderately reduced. The right  ventricular size is normal. There is mildly elevated pulmonary artery  systolic pressure. The estimated right ventricular systolic pressure is  19.1 mmHg.   3. Left atrial size was moderately dilated.   4. The mitral valve is degenerative. Moderate to severe mitral valve  regurgitation. No evidence of mitral stenosis.   5. The aortic valve is tricuspid. Aortic valve regurgitation is mild.  Moderate aortic valve stenosis. Aortic valve area, by VTI measures 1.08  cm. Aortic valve mean gradient measures 22.0 mmHg.   6. Aortic dilatation noted. There is mild dilatation of the ascending  aorta measuring 42 mm.   7. The inferior vena cava is dilated in size with >50% respiratory  variability, suggesting right atrial pressure of 8 mmHg.   [facility] 03/21/20  Ost Cx to Prox Cx lesion is 85% stenosed.  Mid Cx to Dist Cx lesion is 100% stenosed.  1st Mrg lesion is 70% stenosed.  Prox LAD to Mid LAD lesion is 90% stenosed.  Ost RCA to Prox RCA lesion is 99% stenosed.  SVG.  The graft exhibits mild diffuse disease.  Left radial artery graft was visualized by  angiography.  Origin lesion is 100% stenosed.  LIMA graft was visualized by non-selective angiography and is normal in caliber.  The graft exhibits no disease.   1.  Significant underlying three-vessel coronary artery disease with patent LIMA to LAD and SVG to OM 3.  Radial graft to RCA is occluded at the ostium with critical stenosis of the ostial native right coronary artery.  This is the likely culprit for recent non-STEMI and current unstable angina. 2.  Severe aortic stenosis with peak gradient of 27.4 mmHg and valve area of 0.87. 3.  Right heart catheterization showed moderately to severely elevated filling pressures with pulmonary wedge pressure of 31 mmHg, moderate pulmonary hypertension at 50/30 mmHg and normal cardiac output at 5.06 with a cardiac index of 2.36.  Prominent V waves on wedge pressure tracing suggestive of significant mitral regurgitation.   Recommendations: This is an overall difficult case.  The patient will need to be evaluated for TAVR plus RCA PCI.  Advanced chronic kidney disease and anemia in addition to significant heart failure make timing of this difficult.  The patient will likely need to be initiated on hemodialysis in order to improve his volume status.  He needs blood transfusion to keep his hemoglobin above 9.  Nonetheless, he might not be able to tolerate large volume shifts due to significant coronary artery disease and aortic stenosis. I will discuss with our structural heart team.  It might be best to transfer the patient to [facility]. Vascular access on this patient is very difficult due to extensive stenting and calcifications of the lower extremities.  The SFA stent on the right side might be extending into the common femoral artery.  Arterial access might be easier via the left femoral artery.  EKG:  EKG is  ordered today.  The ekg ordered today demonstrates SR 81 bpm with 1st degree AV block, LBBB. Stable T wave abnormality in lateral  leads.  Recent Labs: 03/20/2020: B Natriuretic Peptide 503.2 03/24/2020: ALT 18 03/25/2020: Magnesium 2.8 04/01/2020: BUN 59; Creatinine, Ser 3.77; Hemoglobin 9.4; Platelets  151; Potassium 4.3; Sodium 140  Recent Lipid Panel    Component Value Date/Time   CHOL 122 02/13/2020 0525   CHOL 116 11/18/2017 1002   TRIG 163 (H) 02/13/2020 0525   HDL 33 (L) 02/13/2020 0525   HDL 30 (L) 11/18/2017 1002   CHOLHDL 3.7 02/13/2020 0525   VLDL 33 02/13/2020 0525   LDLCALC 56 02/13/2020 0525   LDLCALC 51 11/18/2017 1002   LDLDIRECT 61.0 03/15/2017 1021   Home Medications   Current Meds  Medication Sig  . albuterol (PROAIR HFA) 108 (90 Base) MCG/ACT inhaler Inhale 2 puffs into the lungs every 6 (six) hours as needed for shortness of breath.  [name] aspirin 81 MG tablet Take 81 mg by mouth daily.  . busPIRone (BUSPAR) 10 MG tablet TAKE 2 TABLETS(20 MG) BY MOUTH TWICE DAILY (Patient taking differently: Take 20 mg by mouth 2 (two) times daily. )  . busPIRone (BUSPAR) 10 MG tablet Take 20 mg by mouth 2 (two) times daily.  . calcitRIOL (ROCALTROL) 0.25 MCG capsule Take 0.25 mcg by mouth every Monday, Wednesday, and Friday. Monday then Wednesday then Friday  . carvedilol (COREG) 12.5 MG tablet Take 1 tablet (12.5 mg total) by mouth 2 (two) times daily. (Patient taking differently: Take 12.5 mg by mouth 2 (two) times daily with a meal. )  . clopidogrel (PLAVIX) 75 MG tablet Take 1 tablet (75 mg total) by mouth daily.  . cyclobenzaprine (FLEXERIL) 10 MG tablet Take 10 mg by mouth 3 (three) times daily as needed for muscle spasms.  . Dextran 70-Hypromellose (ARTIFICIAL TEARS) 0.1-0.3 % SOLN Place 1 drop into both eyes 4 (four) times daily as needed (dry eyes).  . finasteride (PROSCAR) 5 MG tablet Take 5 mg by mouth daily.  . hydrALAZINE (APRESOLINE) 25 MG tablet Take 1 tablet (25 mg total) by mouth every 8 (eight) hours.  . insulin aspart (NOVOLOG) 100 UNIT/ML injection Inject 0-9 Units into the skin 3 (three) times  daily with meals.  . isosorbide mononitrate (IMDUR) 30 MG 24 hr tablet Take 3 tablets (90 mg total) by mouth daily.  . nitroGLYCERIN (NITROSTAT) 0.4 MG SL tablet Place 1 tablet (0.4 mg total) under the tongue every 5 (five) minutes as needed. Maximum of 3 doses. (Patient taking differently: Place 0.4 mg under the tongue every 5 (five) minutes as needed for chest pain. Maximum of 3 doses.)  . rosuvastatin (CRESTOR) 10 MG tablet Take 1 tablet (10 mg total) by mouth daily.  . sertraline (ZOLOFT) 100 MG tablet Take 1 tablet (100 mg total) by mouth daily.  . tamsulosin (FLOMAX) 0.4 MG CAPS capsule Take 1 capsule by mouth daily (Patient taking differently: Take 0.4 mg by mouth daily. )  . torsemide (DEMADEX) 20 MG tablet Take 2 tablets (40 mg total) by mouth 2 (two) times daily.  . [DISCONTINUED] torsemide (DEMADEX) 20 MG tablet Take 40 mg by mouth 2 (two) times daily.    Review of Systems  Review of Systems  Constitutional: Positive for malaise/fatigue. Negative for chills and fever.  Cardiovascular: Positive for dyspnea on exertion. Negative for chest pain, leg swelling, near-syncope, orthopnea, palpitations and syncope.  Respiratory: Negative for cough, shortness of breath and wheezing.   Gastrointestinal: Negative for nausea and vomiting.  Neurological: Negative for dizziness, light-headedness and weakness.   All other systems reviewed and are otherwise negative except as noted above.  Physical Exam    VS:  BP 120/60 (BP Location: Left Arm, Patient Position: Sitting, Cuff Size: Normal)  Pulse 98   Ht 5\' 6"  (1.676 m)   Wt 234 lb 8 oz (106.4 kg)   SpO2 95%   BMI 37.85 kg/m  , BMI Body mass index is 37.85 kg/m. GEN: Well nourished, overweight,  well developed, in no acute distress. HEENT: normal. Neck: Supple, no JVD, carotid bruits, or masses. Cardiac: RRR, no  rubs, or gallops. ZJ6/9 systolic murmur. No clubbing, cyanosis. Nonpitting pedal edema.  Radials/DP/PT 2+ and equal bilaterally.   Respiratory:  Respirations regular and unlabored, clear to auscultation bilaterally. GI: Soft, nontender.  MS: No deformity or atrophy. Skin: Warm and dry, no rash. R radial cath site clean, dry, intact without ecchymosis, hematoma, nor infection.  Neuro:  Strength and sensation are intact. Psych: Normal affect.  Assessment & Plan    1. CAD - s/p CABG 2012 and DES RCA 03/2020. Recommended for DAPT for at least 6 months. GDMT includes aspirin, plavix, imdur, statin, coreg. No recurrent chest pain, pressure, tightness. Cath site from 03/23/20 continues to heal appropriately.  2. HFpEF/pulmonary hypertension -Echo 03/22/2020 EF down from 50 to 65% to 30-35% with moderate aortic stenosis. Discussed that his DOE is likely multifactorial CAD, HFpEF, pulmonary hypertension, deconditioning, anemia, severe AS. Down 3 lb compared to office visit 10 days ago. Improved since increased dose of Torsemide.  Continue Torsemide 40mg  BID. Labs 04/01/20 with creatinine 3.77 and GFR 16 which was improved compared to previous. Has upcoming labs with nephrology. GDMT includes Coreg, Torsemide. Refill of Torsemide provided.  3. Aortic stenosis -Echo 03/22/2020 with moderate stenosis.  However, felt to be severe on 03/21/2020 R/LHC with peak gradient 27.4 mmHg and valve area 0.87.  Seen by Dr. Angelena Form structural heart team during admission 03/21/2020 with recommendation to continue TAVR work-up as outpatient after dialysis access obtained. 04/21/20 vein mapping upcoming with Dr. Ronalee Belts. No date yet for vascular access. Will require cardiac CT, CT abd/pelvis, and carotid doppler as TAVR workup. Access likely to be complicated by known PAD Will route note to Dr. Angelena Form to discuss timing of CT scans before or after vascular access obtained. Due to need for contrast in CKDIV anticipate will be post-vascular access.  4. CKD IV - Saw nephrology this morning. Upcoming appointment with Dr. Delana Meyer 04/21/20 for vein mapping for dialysis  access.   5. HTN - BP well controlled. Continue current antihypertensive regimen.   6. HLD, LDL goal <70 -01/2020 lipid panel total cholesterol 122, triglycerides 163, HDL 33, LDL 56.  During admission simvastatin 20 mg daily transition to Crestor 10 mg daily. Tolerating well. Plan for repeat lipid/liver panel at next appt.  7. Carotid artery disease s/p left CEA - Continue aspirin, statin, plavix.   8. Anemia of chronic disease - Reports no bleeding complications. Contributory to dyspnea. Encouraged dietary sources of iron. 04/01/20 Hb stable at 9.4.   9. OSA - CPAP compliance encouraged.    Disposition: Follow up 05/17/20 with Dr. Fletcher Anon as previously scheduled.   Loel Dubonnet, NP 04/07/2020, 12:46 PM

## 2020-04-08 DIAGNOSIS — I080 Rheumatic disorders of both mitral and aortic valves: Secondary | ICD-10-CM | POA: Diagnosis not present

## 2020-04-08 DIAGNOSIS — I130 Hypertensive heart and chronic kidney disease with heart failure and stage 1 through stage 4 chronic kidney disease, or unspecified chronic kidney disease: Secondary | ICD-10-CM | POA: Diagnosis not present

## 2020-04-08 DIAGNOSIS — I25110 Atherosclerotic heart disease of native coronary artery with unstable angina pectoris: Secondary | ICD-10-CM | POA: Diagnosis not present

## 2020-04-08 DIAGNOSIS — I5042 Chronic combined systolic (congestive) and diastolic (congestive) heart failure: Secondary | ICD-10-CM | POA: Diagnosis not present

## 2020-04-08 DIAGNOSIS — N184 Chronic kidney disease, stage 4 (severe): Secondary | ICD-10-CM | POA: Diagnosis not present

## 2020-04-08 DIAGNOSIS — E1122 Type 2 diabetes mellitus with diabetic chronic kidney disease: Secondary | ICD-10-CM | POA: Diagnosis not present

## 2020-04-09 ENCOUNTER — Other Ambulatory Visit: Payer: Self-pay

## 2020-04-09 ENCOUNTER — Emergency Department: Payer: Medicare Other

## 2020-04-09 ENCOUNTER — Inpatient Hospital Stay
Admission: EM | Admit: 2020-04-09 | Discharge: 2020-04-12 | DRG: 378 | Disposition: A | Discharge status: Home / Self Care | Payer: Medicare Other | Attending: Obstetrics and Gynecology | Admitting: Obstetrics and Gynecology

## 2020-04-09 DIAGNOSIS — D62 Acute posthemorrhagic anemia: Secondary | ICD-10-CM | POA: Diagnosis present

## 2020-04-09 DIAGNOSIS — Z87891 Personal history of nicotine dependence: Secondary | ICD-10-CM

## 2020-04-09 DIAGNOSIS — R778 Other specified abnormalities of plasma proteins: Secondary | ICD-10-CM | POA: Diagnosis present

## 2020-04-09 DIAGNOSIS — E875 Hyperkalemia: Secondary | ICD-10-CM | POA: Diagnosis present

## 2020-04-09 DIAGNOSIS — N184 Chronic kidney disease, stage 4 (severe): Secondary | ICD-10-CM | POA: Diagnosis present

## 2020-04-09 DIAGNOSIS — I129 Hypertensive chronic kidney disease with stage 1 through stage 4 chronic kidney disease, or unspecified chronic kidney disease: Secondary | ICD-10-CM | POA: Diagnosis not present

## 2020-04-09 DIAGNOSIS — D631 Anemia in chronic kidney disease: Secondary | ICD-10-CM | POA: Diagnosis present

## 2020-04-09 DIAGNOSIS — E1140 Type 2 diabetes mellitus with diabetic neuropathy, unspecified: Secondary | ICD-10-CM | POA: Diagnosis present

## 2020-04-09 DIAGNOSIS — Z803 Family history of malignant neoplasm of breast: Secondary | ICD-10-CM

## 2020-04-09 DIAGNOSIS — E785 Hyperlipidemia, unspecified: Secondary | ICD-10-CM | POA: Diagnosis present

## 2020-04-09 DIAGNOSIS — Z9081 Acquired absence of spleen: Secondary | ICD-10-CM

## 2020-04-09 DIAGNOSIS — I2489 Other forms of acute ischemic heart disease: Secondary | ICD-10-CM

## 2020-04-09 DIAGNOSIS — F329 Major depressive disorder, single episode, unspecified: Secondary | ICD-10-CM | POA: Diagnosis present

## 2020-04-09 DIAGNOSIS — K225 Diverticulum of esophagus, acquired: Secondary | ICD-10-CM | POA: Diagnosis not present

## 2020-04-09 DIAGNOSIS — Z20822 Contact with and (suspected) exposure to covid-19: Secondary | ICD-10-CM | POA: Diagnosis present

## 2020-04-09 DIAGNOSIS — I5042 Chronic combined systolic (congestive) and diastolic (congestive) heart failure: Secondary | ICD-10-CM | POA: Diagnosis present

## 2020-04-09 DIAGNOSIS — Z8261 Family history of arthritis: Secondary | ICD-10-CM

## 2020-04-09 DIAGNOSIS — R7989 Other specified abnormal findings of blood chemistry: Secondary | ICD-10-CM | POA: Diagnosis present

## 2020-04-09 DIAGNOSIS — E1122 Type 2 diabetes mellitus with diabetic chronic kidney disease: Secondary | ICD-10-CM | POA: Diagnosis present

## 2020-04-09 DIAGNOSIS — R0789 Other chest pain: Secondary | ICD-10-CM | POA: Diagnosis not present

## 2020-04-09 DIAGNOSIS — I252 Old myocardial infarction: Secondary | ICD-10-CM

## 2020-04-09 DIAGNOSIS — Z7982 Long term (current) use of aspirin: Secondary | ICD-10-CM

## 2020-04-09 DIAGNOSIS — R0602 Shortness of breath: Secondary | ICD-10-CM | POA: Diagnosis not present

## 2020-04-09 DIAGNOSIS — N179 Acute kidney failure, unspecified: Secondary | ICD-10-CM | POA: Diagnosis present

## 2020-04-09 DIAGNOSIS — I350 Nonrheumatic aortic (valve) stenosis: Secondary | ICD-10-CM

## 2020-04-09 DIAGNOSIS — K921 Melena: Principal | ICD-10-CM | POA: Diagnosis present

## 2020-04-09 DIAGNOSIS — R0902 Hypoxemia: Secondary | ICD-10-CM | POA: Diagnosis not present

## 2020-04-09 DIAGNOSIS — J9811 Atelectasis: Secondary | ICD-10-CM | POA: Diagnosis not present

## 2020-04-09 DIAGNOSIS — Z79899 Other long term (current) drug therapy: Secondary | ICD-10-CM

## 2020-04-09 DIAGNOSIS — Z7902 Long term (current) use of antithrombotics/antiplatelets: Secondary | ICD-10-CM

## 2020-04-09 DIAGNOSIS — G4733 Obstructive sleep apnea (adult) (pediatric): Secondary | ICD-10-CM | POA: Diagnosis present

## 2020-04-09 DIAGNOSIS — Z820 Family history of epilepsy and other diseases of the nervous system: Secondary | ICD-10-CM

## 2020-04-09 DIAGNOSIS — I248 Other forms of acute ischemic heart disease: Secondary | ICD-10-CM | POA: Diagnosis present

## 2020-04-09 DIAGNOSIS — Z951 Presence of aortocoronary bypass graft: Secondary | ICD-10-CM

## 2020-04-09 DIAGNOSIS — I2510 Atherosclerotic heart disease of native coronary artery without angina pectoris: Secondary | ICD-10-CM | POA: Diagnosis present

## 2020-04-09 DIAGNOSIS — I130 Hypertensive heart and chronic kidney disease with heart failure and stage 1 through stage 4 chronic kidney disease, or unspecified chronic kidney disease: Secondary | ICD-10-CM | POA: Diagnosis not present

## 2020-04-09 DIAGNOSIS — Z86718 Personal history of other venous thrombosis and embolism: Secondary | ICD-10-CM

## 2020-04-09 DIAGNOSIS — E1151 Type 2 diabetes mellitus with diabetic peripheral angiopathy without gangrene: Secondary | ICD-10-CM | POA: Diagnosis present

## 2020-04-09 DIAGNOSIS — Z955 Presence of coronary angioplasty implant and graft: Secondary | ICD-10-CM

## 2020-04-09 DIAGNOSIS — K449 Diaphragmatic hernia without obstruction or gangrene: Secondary | ICD-10-CM | POA: Diagnosis present

## 2020-04-09 DIAGNOSIS — E1165 Type 2 diabetes mellitus with hyperglycemia: Secondary | ICD-10-CM | POA: Diagnosis not present

## 2020-04-09 DIAGNOSIS — N2581 Secondary hyperparathyroidism of renal origin: Secondary | ICD-10-CM | POA: Diagnosis present

## 2020-04-09 DIAGNOSIS — J449 Chronic obstructive pulmonary disease, unspecified: Secondary | ICD-10-CM | POA: Diagnosis not present

## 2020-04-09 DIAGNOSIS — J069 Acute upper respiratory infection, unspecified: Secondary | ICD-10-CM | POA: Diagnosis present

## 2020-04-09 DIAGNOSIS — I447 Left bundle-branch block, unspecified: Secondary | ICD-10-CM | POA: Diagnosis present

## 2020-04-09 DIAGNOSIS — Z8673 Personal history of transient ischemic attack (TIA), and cerebral infarction without residual deficits: Secondary | ICD-10-CM

## 2020-04-09 DIAGNOSIS — D649 Anemia, unspecified: Secondary | ICD-10-CM

## 2020-04-09 DIAGNOSIS — Z86010 Personal history of colonic polyps: Secondary | ICD-10-CM

## 2020-04-09 DIAGNOSIS — I5043 Acute on chronic combined systolic (congestive) and diastolic (congestive) heart failure: Secondary | ICD-10-CM | POA: Diagnosis not present

## 2020-04-09 DIAGNOSIS — Z794 Long term (current) use of insulin: Secondary | ICD-10-CM

## 2020-04-09 DIAGNOSIS — F32A Depression, unspecified: Secondary | ICD-10-CM | POA: Diagnosis present

## 2020-04-09 DIAGNOSIS — I132 Hypertensive heart and chronic kidney disease with heart failure and with stage 5 chronic kidney disease, or end stage renal disease: Secondary | ICD-10-CM | POA: Diagnosis present

## 2020-04-09 DIAGNOSIS — I959 Hypotension, unspecified: Secondary | ICD-10-CM | POA: Diagnosis not present

## 2020-04-09 DIAGNOSIS — R809 Proteinuria, unspecified: Secondary | ICD-10-CM | POA: Diagnosis not present

## 2020-04-09 DIAGNOSIS — Z801 Family history of malignant neoplasm of trachea, bronchus and lung: Secondary | ICD-10-CM

## 2020-04-09 DIAGNOSIS — R079 Chest pain, unspecified: Secondary | ICD-10-CM | POA: Diagnosis not present

## 2020-04-09 LAB — GLUCOSE, CAPILLARY
Glucose-Capillary: 162 mg/dL — ABNORMAL HIGH (ref 70–99)
Glucose-Capillary: 147 mg/dL — ABNORMAL HIGH (ref 70–99)

## 2020-04-09 LAB — BASIC METABOLIC PANEL
Anion gap: 16 — ABNORMAL HIGH (ref 5–15)
BUN: 94 mg/dL — ABNORMAL HIGH (ref 8–23)
CO2: 22 mmol/L (ref 22–32)
Calcium: 8.5 mg/dL — ABNORMAL LOW (ref 8.9–10.3)
Chloride: 97 mmol/L — ABNORMAL LOW (ref 98–111)
Creatinine, Ser: 3.99 mg/dL — ABNORMAL HIGH (ref 0.61–1.24)
GFR calc Af Amer: 17 mL/min — ABNORMAL LOW (ref >60)
GFR calc non Af Amer: 15 mL/min — ABNORMAL LOW (ref >60)
Glucose, Bld: 252 mg/dL — ABNORMAL HIGH (ref 70–99)
Potassium: 4.7 mmol/L (ref 3.5–5.1)
Sodium: 135 mmol/L (ref 135–145)

## 2020-04-09 LAB — SARS CORONAVIRUS 2 BY RT PCR (HOSPITAL ORDER, PERFORMED IN ~~LOC~~ HOSPITAL LAB): SARS Coronavirus 2: NEGATIVE

## 2020-04-09 LAB — CBC
HCT: 19.7 % — ABNORMAL LOW (ref 39.0–52.0)
Hemoglobin: 6.5 g/dL — ABNORMAL LOW (ref 13.0–17.0)
MCH: 32.2 pg (ref 26.0–34.0)
MCHC: 33 g/dL (ref 30.0–36.0)
MCV: 97.5 fL (ref 80.0–100.0)
Platelets: 162 10*3/uL (ref 150–400)
RBC: 2.02 MIL/uL — ABNORMAL LOW (ref 4.22–5.81)
RDW: 16.5 % — ABNORMAL HIGH (ref 11.5–15.5)
WBC: 10.3 10*3/uL (ref 4.0–10.5)
nRBC: 0 % (ref 0.0–0.2)

## 2020-04-09 LAB — HEMOGLOBIN AND HEMATOCRIT, BLOOD
HCT: 23.1 % — ABNORMAL LOW (ref 39.0–52.0)
Hemoglobin: 7.6 g/dL — ABNORMAL LOW (ref 13.0–17.0)

## 2020-04-09 LAB — PREPARE RBC (CROSSMATCH)

## 2020-04-09 LAB — TROPONIN I (HIGH SENSITIVITY)
Troponin I (High Sensitivity): 129 ng/L (ref <18)
Troponin I (High Sensitivity): 223 ng/L (ref <18)
Troponin I (High Sensitivity): 402 ng/L (ref <18)
Troponin I (High Sensitivity): 604 ng/L (ref <18)

## 2020-04-09 MED ORDER — PANTOPRAZOLE SODIUM 40 MG IV SOLR
40.0000 mg | Freq: Two times a day (BID) | INTRAVENOUS | Status: DC
  Administered 2020-04-09 – 2020-04-12 (×7): 40 mg via INTRAVENOUS
  Filled 2020-04-09 (×7): qty 40

## 2020-04-09 MED ORDER — INSULIN ASPART 100 UNIT/ML ~~LOC~~ SOLN
0.0000 [IU] | SUBCUTANEOUS | Status: DC
  Administered 2020-04-10 (×3): 3 [IU] via SUBCUTANEOUS
  Administered 2020-04-10 – 2020-04-11 (×2): 2 [IU] via SUBCUTANEOUS
  Administered 2020-04-11: 5 [IU] via SUBCUTANEOUS
  Administered 2020-04-11 – 2020-04-12 (×3): 2 [IU] via SUBCUTANEOUS
  Filled 2020-04-09 (×9): qty 1

## 2020-04-09 MED ORDER — CLOPIDOGREL BISULFATE 75 MG PO TABS
75.0000 mg | ORAL_TABLET | Freq: Every day | ORAL | Status: DC
  Administered 2020-04-10 – 2020-04-12 (×3): 75 mg via ORAL
  Filled 2020-04-09 (×3): qty 1

## 2020-04-09 MED ORDER — ONDANSETRON HCL 4 MG/2ML IJ SOLN
INTRAMUSCULAR | Status: AC
  Administered 2020-04-09: 4 mg via INTRAVENOUS
  Filled 2020-04-09: qty 2

## 2020-04-09 MED ORDER — SODIUM CHLORIDE 0.9% IV SOLUTION
Freq: Once | INTRAVENOUS | Status: AC
  Filled 2020-04-09: qty 250

## 2020-04-09 MED ORDER — ONDANSETRON HCL 4 MG PO TABS: 4.0000 mg | ORAL_TABLET | Freq: Four times a day (QID) | ORAL | Status: DC | PRN

## 2020-04-09 MED ORDER — DEXTRAN 70-HYPROMELLOSE 0.1-0.3 % OP SOLN: 1.0000 [drp] | Freq: Four times a day (QID) | OPHTHALMIC | Status: DC | PRN

## 2020-04-09 MED ORDER — IPRATROPIUM-ALBUTEROL 0.5-2.5 (3) MG/3ML IN SOLN: 3.0000 mL | Freq: Four times a day (QID) | RESPIRATORY_TRACT | Status: DC | PRN

## 2020-04-09 MED ORDER — ONDANSETRON HCL 4 MG/2ML IJ SOLN: 4.0000 mg | Freq: Once | INTRAMUSCULAR | Status: AC

## 2020-04-09 MED ORDER — FUROSEMIDE 10 MG/ML IJ SOLN: 40.0000 mg | Freq: Two times a day (BID) | INTRAMUSCULAR | Status: DC

## 2020-04-09 MED ORDER — SODIUM CHLORIDE 0.9 % IV SOLN: INTRAVENOUS | Status: DC

## 2020-04-09 MED ORDER — FUROSEMIDE 10 MG/ML IJ SOLN
40.0000 mg | Freq: Once | INTRAMUSCULAR | Status: AC
  Administered 2020-04-09: 40 mg via INTRAVENOUS
  Filled 2020-04-09: qty 4

## 2020-04-09 MED ORDER — ONDANSETRON HCL 4 MG/2ML IJ SOLN: 4.0000 mg | Freq: Four times a day (QID) | INTRAMUSCULAR | Status: DC | PRN

## 2020-04-09 MED ORDER — HYPROMELLOSE (GONIOSCOPIC) 2.5 % OP SOLN
1.0000 [drp] | Freq: Four times a day (QID) | OPHTHALMIC | Status: DC | PRN
  Filled 2020-04-09: qty 15

## 2020-04-09 NOTE — ED Notes (Addendum)
Pt gvn ice chips, is sitting in chair despite request to remain in bed on monitor. NAD noted.

## 2020-04-09 NOTE — ED Triage Notes (Signed)
Pt comes EMS from home with CP starting at midnight. Pt took x3 sublingual nitro with no relief. Pt pale, states that his fingers never pick up the pulse ox. Hx of CABG.

## 2020-04-09 NOTE — Consult Note (Signed)
Cardiology Consultation:   Patient ID: [name] [name] MRN: [id number]; DOB: [birth year]  Admit date: 04/09/2020 Date of Consult: 04/09/2020  Primary Care Provider: [name], MD [facility] Cardiologist: [name], MD  [facility] Electrophysiologist:  None    Patient Profile:   [name] [name] is a 64 y.o. male with a hx of CAD/CABG, end-stage renal disease, who is being seen today for the evaluation of chest pain at the request of Dr. Francine Graven.  History of Present Illness:   [name] [name] is a 64 year old gentleman with history of CAD s/p CABG 2012 recent DES to RCA on 03/23/2020, mod to severe AS, CKD-IV,  ICM EF 30-35%, HTN, HLD who presents due to chest pain.  Patient states having chest discomfort which he describes as chest pressure, rates it as 10 out of 10 in severity not related with exertion.  Symptoms began at about 6 PM last evening.  Symptoms got worse at 3 AM today which prompted patient to call EMS and was brought to the hospital.  He had a recent stent placement to the RCA just under 3 weeks ago, was started on aspirin, Plavix.  Patient has noticed dark stools over the past 2 days.  Does not take any PPIs.  He is being evaluated for TAVR for moderate to severe AS by structural team.  Plans to see vascular surgery for AV graft fistula placement as may be needing dialysis in the near future.  Upon admission, no ECG noted in chart.  Telemetry shows sinus tachycardia heart rate 108.  Rhythm strip from EMS evaluated with no evidence for ischemia or ST deviations.  Sinus tachycardia noted.  Hemoglobin on admission was noted to be 6.5.  2 units of blood was transfused to patient.  He states feeling fine after blood transfusion, currently denies chest pain upon my exam.  Troponin level was 129, 223.   Past Medical History:  Diagnosis Date  . 3-vessel CAD    8/30 LHC with radial graft to RCA occluded at the ostium with critical stenosis of the ostial native RCA.  .  Adenomatous colon polyp   . Allergy   . Anxiety   . Arthritis    RA  . Bell's palsy 2007  . CAD (coronary artery disease)    03/21/2020 LHC with patent LIMA to LAD and SVG to OM 3.  Radial graft to RCA occluded at the ostium with critical stenosis of the ostial and native RCA.  . Carotid artery occlusion    Bilateral carotid stenosis s/p left-sided CEA.  . Cataract    Dr. Dawna Part  . CKD (chronic kidney disease), stage IV ([place])    03/2020 vascular surgery access planned  . Depression   . Diabetes mellitus   . Diverticulosis   . Duodenitis   . DVT (deep venous thrombosis) (H[facility])    in leg  . ESRD (end stage renal disease) ([place])   . Gastropathy   . GERD (gastroesophageal reflux disease)   . Heart failure with preserved ejection fraction ([place])    8/30 RHC with moderately to severely elevated filling pressures and pulmonary wedge pressure 31 mmHg, moderate pulmonary hypertension  . Helicobacter pylori gastritis   . Hx of CABG    2012  . Hyperlipidemia   . Hypertension   . Mitral regurgitation and aortic stenosis    RHC 8/30 and echo 12/2019.  [name] Neuropathy   . Obesity   . Peripheral vascular disease ([place])    S/p extensive revascularization by  vascular surgery 05/2019  . Post splenectomy syndrome   . Psoriasis   . Severe aortic stenosis    03/21/20 RHC with peak gradient 27.4 mmHg and valve area 0.87.  [name] Sleep apnea    uses cpap  . Stroke ([place])   . Tobacco use disorder    recently quit  . Tubular adenoma 08/2013   Dr. Hilarie Fredrickson  . Weak urinary stream     Past Surgical History:  Procedure Laterality Date  . CARDIAC CATHETERIZATION  08/2010   LAD: 80% ISR, RCA: 80% ostial, OM 80-90%  . CAROTID ENDARTERECTOMY  08/2010   left/ Dr. Kellie Simmering  . CATARACT EXTRACTION W/PHACO Left 07/09/2017   Procedure: CATARACT EXTRACTION PHACO AND INTRAOCULAR LENS PLACEMENT (IOC);  Surgeon: Birder Robson, MD;  Location: [facility] ORS;  Service: Ophthalmology;  Laterality: Left;  [place] 00:23 AP% 14.2 CDE  3.26 Fluid pack lot # [id number]  . CATARACT EXTRACTION W/PHACO Right 09/10/2017   Procedure: CATARACT EXTRACTION PHACO AND INTRAOCULAR LENS PLACEMENT (IOC);  Surgeon: Birder Robson, MD;  Location: [facility] ORS;  Service: Ophthalmology;  Laterality: Right;  [place] 00:26.4 AP% 17.3 CDE 4.59 Fluid Pack Lot # [id number]  . COLONOSCOPY    . CORONARY ANGIOPLASTY     LAD: before CABG  . CORONARY ARTERY BYPASS GRAFT  09/06/2010   At [facility]: LIMA to LAD, left radial to RCA, sequential SVG to OM3 and 4  . CORONARY STENT INTERVENTION N/A 03/23/2020   Procedure: CORONARY STENT INTERVENTION;  Surgeon: Wellington Hampshire, MD;  Location: [place] [facility];  Service: Cardiovascular;  Laterality: N/A;  RCA  . heart stents  Jan 2011   leg stents 06/2009 and 03/2010  . LOWER EXTREMITY ANGIOGRAPHY Left 06/09/2019   Procedure: LOWER EXTREMITY ANGIOGRAPHY;  Surgeon: Katha Cabal, MD;  Location: [place] [facility];  Service: Cardiovascular;  Laterality: Left;  . PTA of illiac and SFA  multiple   Dr. Ronalee Belts, s/p revision 11/2012  . RIGHT AND LEFT HEART CATH N/A 03/21/2020   Procedure: RIGHT AND LEFT HEART CATH possible percutaneous intervention;  Surgeon: Wellington Hampshire, MD;  Location: [place] [facility];  Service: Cardiovascular;  Laterality: N/A;  . RIGHT/LEFT HEART CATH AND CORONARY/GRAFT ANGIOGRAPHY N/A 02/15/2020   Procedure: RIGHT/LEFT HEART CATH AND CORONARY/GRAFT ANGIOGRAPHY;  Surgeon: Wellington Hampshire, MD;  Location: [place] [facility];  Service: Cardiovascular;  Laterality: N/A;  . SPLENECTOMY    . VASCULAR SURGERY     LEG STENTS     Home Medications:  Prior to Admission medications   Medication Sig Start Date End Date Taking? Authorizing Provider  albuterol (PROAIR HFA) 108 (90 Base) MCG/ACT inhaler Inhale 2 puffs into the lungs every 6 (six) hours as needed for shortness of breath. 09/14/19   [name], MD  aspirin 81 MG tablet Take 81 mg by mouth daily.    [provider]    busPIRone (BUSPAR) 10 MG tablet TAKE 2 TABLETS(20 MG) BY MOUTH TWICE DAILY Patient taking differently: Take 20 mg by mouth 2 (two) times daily.  02/19/20   [name], MD  calcitRIOL (ROCALTROL) 0.25 MCG capsule Take 0.25 mcg by mouth every Monday, Wednesday, and Friday. Monday then Wednesday then Friday    [provider]  carvedilol (COREG) 12.5 MG tablet Take 1 tablet (12.5 mg total) by mouth 2 (two) times daily. Patient taking differently: Take 12.5 mg by mouth 2 (two) times daily with a meal.  12/10/19   Wellington Hampshire, MD  clopidogrel (PLAVIX) 75 MG tablet Take 1 tablet (75 mg total) by mouth daily. 03/25/20   Sande Rives E, PA-C  cyclobenzaprine (FLEXERIL) 10 MG tablet Take 10 mg by mouth 3 (three) times daily as needed for muscle spasms.    [provider]  Dextran 70-Hypromellose (ARTIFICIAL TEARS) 0.1-0.3 % SOLN Place 1 drop into both eyes 4 (four) times daily as needed (dry eyes).    [provider]  finasteride (PROSCAR) 5 MG tablet Take 5 mg by mouth daily.    [provider]  hydrALAZINE (APRESOLINE) 25 MG tablet Take 1 tablet (25 mg total) by mouth every 8 (eight) hours. 03/25/20   Sande Rives E, PA-C  insulin aspart (NOVOLOG) 100 UNIT/ML injection Inject 0-9 Units into the skin 3 (three) times daily with meals. 03/21/20   Ezekiel Slocumb, DO  isosorbide mononitrate (IMDUR) 30 MG 24 hr tablet Take 3 tablets (90 mg total) by mouth daily. 02/23/20   Dunn, Areta Haber, PA-C  nitroGLYCERIN (NITROSTAT) 0.4 MG SL tablet Place 1 tablet (0.4 mg total) under the tongue every 5 (five) minutes as needed. Maximum of 3 doses. Patient taking differently: Place 0.4 mg under the tongue every 5 (five) minutes as needed for chest pain. Maximum of 3 doses. 02/23/20   Dunn, Areta Haber, PA-C  rosuvastatin (CRESTOR) 10 MG tablet Take 1 tablet (10 mg total) by mouth daily. 03/26/20   Sande Rives E, PA-C  sertraline (ZOLOFT) 100 MG tablet Take 1 tablet (100 mg total)  by mouth daily. 02/23/20   [name], MD  tamsulosin (FLOMAX) 0.4 MG CAPS capsule Take 1 capsule by mouth daily Patient taking differently: Take 0.4 mg by mouth daily.  01/20/20   [name], MD  torsemide (DEMADEX) 20 MG tablet Take 2 tablets (40 mg total) by mouth 2 (two) times daily. 04/07/20   Loel Dubonnet, NP    Inpatient Medications: Scheduled Meds: . [START ON 04/10/2020] furosemide  40 mg Intravenous Q12H  . insulin aspart  0-15 Units Subcutaneous Q4H  . pantoprazole (PROTONIX) IV  40 mg Intravenous Q12H   Continuous Infusions: . sodium chloride     PRN Meds: hydroxypropyl methylcellulose / hypromellose, ipratropium-albuterol, ondansetron **OR** ondansetron (ZOFRAN) IV  Allergies:    Allergies  Allergen Reactions  . Contrast Media [Iodinated Diagnostic Agents] Rash and Other (See Comments)    Got very hot and red   . Glipizide Other (See Comments)    ANTIDIABETICS. Burning  . Hydroxychloroquine Other (See Comments)    Stomach upset  . Metrizamide Other (See Comments)    Got very hot and red  . Penicillins Hives and Swelling    Has patient had a PCN reaction causing immediate rash, facial/tongue/throat swelling, SOB or lightheadedness with hypotension: yes Has patient had a PCN reaction causing severe rash involving mucus membranes or skin necrosis: no  Has patient had a PCN reaction that required hospitalization: yes Has patient had a PCN reaction occurring within the last 10 years: no If all of the above answers are "NO", then may proceed with Cephalosporin use.     Social History:   Social History   Socioeconomic History  . Marital status: Widowed    Spouse name: Not on file  . Number of children: 2  . Years of education: Not on file  . Highest education level: Not on file  Occupational History  . Occupation: Chief Operating Officer at [employer]: Disabled mostly due to neuropathy  Tobacco Use  .  Smoking status: Former Smoker    Packs/day:  0.75    Years: 40.00    Pack years: 30.00    Types: Cigarettes    Quit date: 04/15/2017    Years since quitting: 2.9  . Smokeless tobacco: Never Used  . Tobacco comment: 1 cigarette a day  Vaping Use  . Vaping Use: Never used  Substance and Sexual Activity  . Alcohol use: No  . Drug use: No  . Sexual activity: Never  Other Topics Concern  . Not on file  Social History Narrative   Wife died 01/14/2019      Has living will   Gearldine Shown and his wife Levada Dy should be his health care POA   Would accept resuscitation --but no prolonged ventilation   No tube feeds if cognitively unaware   Social Determinants of Health   Financial Resource Strain: Low Risk   . Difficulty of Paying Living Expenses: Not very hard  Food Insecurity: No Food Insecurity  . Worried About Charity fundraiser in the Last Year: Never true  . Ran Out of Food in the Last Year: Never true  Transportation Needs: No Transportation Needs  . Lack of Transportation (Medical): No  . Lack of Transportation (Non-Medical): No  Physical Activity:   . Days of Exercise per Week: Not on file  . Minutes of Exercise per Session: Not on file  Stress: No Stress Concern Present  . Feeling of Stress : Only a little  Social Connections: Unknown  . Frequency of Communication with Friends and Family: More than three times a week  . Frequency of Social Gatherings with Friends and Family: Not on file  . Attends Religious Services: Not on file  . Active Member of Clubs or Organizations: Not on file  . Attends Archivist Meetings: Not on file  . Marital Status: Not on file  Intimate Partner Violence: Unknown  . Fear of Current or Ex-Partner: No  . Emotionally Abused: No  . Physically Abused: No  . Sexually Abused: Not on file    Family History:    Family History  Problem Relation Age of Onset  . Lung cancer Father 42  . Arthritis Father   . Breast cancer Sister 61  . Alcoholism Sister   . Dementia Mother   .  Alzheimer's disease Mother   . Arthritis Mother   . Alcoholism Brother   . Epilepsy Sister   . Diabetes Paternal Grandfather   . Colon polyps Paternal Grandfather 2  . Alcoholism Sister   . Alcoholism Sister   . Alcoholism Brother      ROS:  Please see the history of present illness.   All other ROS reviewed and negative.     Physical Exam/Data:   Vitals:   04/09/20 1230 04/09/20 1244 04/09/20 1300 04/09/20 1330  BP:    127/78  Pulse:  (!) 103    Resp: 19 (!) 21 14   Temp:      TempSrc:  Oral    SpO2:    97%  Weight:      Height:        Intake/Output Summary (Last 24 hours) at 04/09/2020 1427 Last data filed at 04/09/2020 1419 Gross per 24 hour  Intake 720 ml  Output --  Net 720 ml   Last 3 Weights 04/09/2020 04/07/2020 03/29/2020  Weight (lbs) 225 lb 234 lb 8 oz 237 lb  Weight (kg) 102.059 kg 106.369 kg 107.502 kg  Some encounter information  is confidential and restricted. Go to Review Flowsheets activity to see all data.     Body mass index is 36.32 kg/m.  General:  Well nourished, well developed, in no acute distress HEENT: normal Lymph: no adenopathy Neck: no JVD Endocrine:  No thryomegaly Vascular: No carotid bruits; FA pulses 2+ bilaterally without bruits  Cardiac:  normal S1, S2; RRR; 3/6 systolic murmur Lungs: Decreased breath sounds at bases Abd: soft, nontender, no hepatomegaly  Ext: no edema Musculoskeletal:  No deformities, BUE and BLE strength normal and equal Skin: warm and dry  Neuro:  CNs 2-12 intact, no focal abnormalities noted Psych:  Normal affect   EKG:  The EKG was personally reviewed and demonstrates: Sinus tachycardia heart rate 108 Telemetry:  Telemetry was personally reviewed and demonstrates: Currently off telemetry  Relevant CV Studies: Echo 02/2020 1. Left ventricular ejection fraction, by estimation, is 30-35%. The left  ventricle has moderately decreased function. The left ventricle  demonstrates regional wall motion  abnormalities, basal to mid inferior and  inferoseptal akinesis, basal to mid  inferolateral severe hypokinesis. The left ventricular internal cavity  size was mildly dilated. There is mild left ventricular hypertrophy. Left  ventricular diastolic parameters are consistent with Grade II diastolic  dysfunction (pseudonormalization).  2. Right ventricular systolic function is moderately reduced. The right  ventricular size is normal. There is mildly elevated pulmonary artery  systolic pressure. The estimated right ventricular systolic pressure is  60.6 mmHg.  3. Left atrial size was moderately dilated.  4. The mitral valve is degenerative. Moderate to severe mitral valve  regurgitation. No evidence of mitral stenosis.  5. The aortic valve is tricuspid. Aortic valve regurgitation is mild.  Moderate aortic valve stenosis. Aortic valve area, by VTI measures 1.08  cm. Aortic valve mean gradient measures 22.0 mmHg.  6. Aortic dilatation noted. There is mild dilatation of the ascending  aorta measuring 42 mm.  7. The inferior vena cava is dilated in size with >50% respiratory  variability, suggesting right atrial pressure of 8 mmHg.   LHC 03/23/2020  Ost RCA to Prox RCA lesion is 99% stenosed.  Post intervention, there is a 0% residual stenosis.  A drug-eluting stent was successfully placed using a STENT RESOLUTE ONYX 4.0X18.   Successful angioplasty and drug-eluting stent placement to the ostial right coronary artery.  60 mL of contrast was used.  Recommendations: Continue dual antiplatelet therapy for at least 6 months. The patient can undergo endoscopic GI procedures from a cardiac standpoint at an overall moderate risk.  Although his aortic stenosis is severe, it does not seem to be critical.  Maintain a hemoglobin above 8.   Laboratory Data:  High Sensitivity Troponin:   Recent Labs  Lab 03/20/20 0620 03/20/20 0827 03/20/20 1255 04/09/20 0916 04/09/20 1115    TROPONINIHS 19* 51* 86* 129* 223*     Chemistry Recent Labs  Lab 04/09/20 0916  NA 135  K 4.7  CL 97*  CO2 22  GLUCOSE 252*  BUN 94*  CREATININE 3.99*  CALCIUM 8.5*  GFRNONAA 15*  GFRAA 17*  ANIONGAP 16*    No results for input(s): PROT, ALBUMIN, AST, ALT, ALKPHOS, BILITOT in the last 168 hours. Hematology Recent Labs  Lab 04/09/20 0916  WBC 10.3  RBC 2.02*  HGB 6.5*  HCT 19.7*  MCV 97.5  MCH 32.2  MCHC 33.0  RDW 16.5*  PLT 162   BNPNo results for input(s): BNP, PROBNP in the last 168 hours.  DDimer No results for  input(s): DDIMER in the last 168 hours.   Radiology/Studies:  DG Chest 2 View  Result Date: 04/09/2020 CLINICAL DATA:  Shortness of breath. History of heart disease. Former smoker. EXAM: CHEST - 2 VIEW COMPARISON:  March 20, 2020 FINDINGS: The heart, hila, and mediastinum are normal. Coarsened interstitial lung markings remain but are improved. Mildly more focal opacities in the bases, left greater than right. No pneumothorax. No nodules or masses. No other abnormalities. IMPRESSION: 1. Coarsened lung markings have improved but persists consistent with bronchitic change versus atypical infection. Pulmonary venous congestion possible. 2. More focal opacities in the bases are mild and may simply represent atelectasis. Early infiltrates possible. Recommend attention on follow-up. Electronically Signed   By: Dorise Bullion III M.D   On: 04/09/2020 10:18           Assessment and Plan:   1.  Chest pain, anemia, minimally elevated troponins -Troponin elevation likely represents demand supply mismatch in the setting of anemia, acute blood loss. -No plan for ischemic work-up at this point -Chest pain symptoms currently resolved after blood transfusion  2. CAD/CABG, recent PCI/DES to RCA on 9/1 -Challenging case with recent PCI and anemia. -Recommend stopping aspirin, continue Plavix for now. -Monitor bleeding/H&H after transfusion. -Keep  hemoglobin>8 -Start Protonix 40 mg twice daily IV -Continue statin  3. ICM EF 30-35% -Restart PTA Coreg, hydralazine, Imdur when blood pressure permits. -Restart torsemide 40 mg daily  4. Mod to severe AS -torsemide as above -outpatient TAVR consideration with structural team  5. GI bleed -protonix -holding asa -GI input  Signed, Kate Sable, MD  04/09/2020 2:27 PM

## 2020-04-09 NOTE — ED Provider Notes (Signed)
[facility] Emergency Department Provider Note   ____________________________________________   First MD Initiated Contact with Patient 04/09/20 [phone]     (approximate)  I have reviewed the triage vital signs and the nursing notes.   HISTORY  Chief Complaint Chest Pain    HPI [name] [name] is a 64 y.o. male with stated past medical history of three-vessel CABG, anxiety, diabetes, and CKD who presents for chest pain that began approximately 6 hours prior to arrival.  Patient took 3 sublingual nitroglycerin without relief.  Patient describes chest tightness/heaviness that is 8/10 in severity, nonradiating, and worse with any exertion.  Patient denies relieving factors.  Patient also endorses associated dark stools for the last 2 days.         Past Medical History:  Diagnosis Date  . 3-vessel CAD    8/30 LHC with radial graft to RCA occluded at the ostium with critical stenosis of the ostial native RCA.  . Adenomatous colon polyp   . Allergy   . Anxiety   . Arthritis    RA  . Bell's palsy 2007  . CAD (coronary artery disease)    03/21/2020 LHC with patent LIMA to LAD and SVG to OM 3.  Radial graft to RCA occluded at the ostium with critical stenosis of the ostial and native RCA.  . Carotid artery occlusion    Bilateral carotid stenosis s/p left-sided CEA.  . Cataract    Dr. Dawna Part  . CKD (chronic kidney disease), stage IV ([place])    03/2020 vascular surgery access planned  . Depression   . Diabetes mellitus   . Diverticulosis   . Duodenitis   . DVT (deep venous thrombosis) (HCC)    in leg  . ESRD (end stage renal disease) ([place])   . Gastropathy   . GERD (gastroesophageal reflux disease)   . Heart failure with preserved ejection fraction ([place])    8/30 RHC with moderately to severely elevated filling pressures and pulmonary wedge pressure 31 mmHg, moderate pulmonary hypertension  . Helicobacter pylori gastritis   . Hx of CABG    2012  .  Hyperlipidemia   . Hypertension   . Mitral regurgitation and aortic stenosis    RHC 8/30 and echo 12/2019.  [name] Neuropathy   . Obesity   . Peripheral vascular disease ([place])    S/p extensive revascularization by vascular surgery 05/2019  . Post splenectomy syndrome   . Psoriasis   . Severe aortic stenosis    03/21/20 RHC with peak gradient 27.4 mmHg and valve area 0.87.  [name] Sleep apnea    uses cpap  . Stroke ([place])   . Tobacco use disorder    recently quit  . Tubular adenoma 08/2013   Dr. Hilarie Fredrickson  . Weak urinary stream     Patient Active Problem List   Diagnosis Date Noted  . Ischemic cardiomyopathy 03/25/2020  . Non-ST elevation (NSTEMI) myocardial infarction ([place]) 03/23/2020  . CAD (coronary artery disease) 03/22/2020  . Chronic heart failure with preserved ejection fraction (HFpEF) ([place]) 03/21/2020  . Hx of CABG 03/21/2020  . Anemia 03/21/2020  . Unstable angina ([place]) 03/20/2020  . Chronic kidney disease   . History of non-ST elevation myocardial infarction (NSTEMI) 02/13/2020  . Moderate aortic valve stenosis 02/13/2020  . Acute on chronic combined systolic and diastolic CHF (congestive heart failure) ([place]) 02/13/2020  . Hypertensive urgency 02/13/2020  . Fall with injury 11/16/2019  . Leg weakness, bilateral 11/16/2019  . Benign hypertensive kidney  disease with chronic kidney disease 09/02/2019  . Proteinuria 09/02/2019  . Secondary hyperparathyroidism of renal origin ([place]) 09/02/2019  . Left arm pain 02/11/2019  . Gastroesophageal reflux 03/25/2018  . Atherosclerosis of aorta ([place]) 09/04/2017  . Thoracic aortic aneurysm ([place]) 09/04/2017  . Right thyroid nodule 04/12/2017  . Thrombocytopenia ([place]) 03/16/2017  . CKD stage 4 due to type 2 diabetes mellitus ([place]) 03/15/2017  . Advance directive discussed with patient 03/15/2017  . BMI 40.0-44.9, adult ([place]) 09/12/2016  . Psoriatic arthritis ([place])   . Lung nodule < 6cm on CT 03/29/2016  . COPD with acute bronchitis ([place])  03/29/2016  . Headache 09/14/2015  . Constipation 10/05/2014  . PVD (peripheral vascular disease) ([place]) 04/23/2014  . Memory loss 04/23/2014  . Preventative health care 11/23/2013  . Benign prostatic hypertrophy without urinary obstruction 03/25/2013  . Enlarged prostate without lower urinary tract symptoms (luts) 03/25/2013  . Peripheral vascular disease due to secondary diabetes mellitus ([place]) 03/25/2013  . Obesity 02/12/2013  . Atherosclerotic heart disease of native coronary artery with angina pectoris ([place]) 06/06/2012  . DM (diabetes mellitus), type 2 with complications ([place]) 50/93/2671  . Severe aortic stenosis   . Low back pain 12/27/2011  . MDD (major depressive disorder), recurrent episode ([place]) 05/28/2011  . Carotid artery disease ([place]) 04/17/2011  . Occlusion and stenosis of carotid artery 04/17/2011  . Obstructive sleep apnea 03/05/2011  . Psoriasis 03/05/2011  . Type 2 diabetes mellitus with diabetic peripheral angiopathy without gangrene ([place]) 03/05/2011  . Polyneuropathy 03/05/2011  . 3-vessel CAD   . Hyperlipidemia, mixed   . Essential hypertension     Past Surgical History:  Procedure Laterality Date  . CARDIAC CATHETERIZATION  08/2010   LAD: 80% ISR, RCA: 80% ostial, OM 80-90%  . CAROTID ENDARTERECTOMY  08/2010   left/ Dr. Kellie Simmering  . CATARACT EXTRACTION W/PHACO Left 07/09/2017   Procedure: CATARACT EXTRACTION PHACO AND INTRAOCULAR LENS PLACEMENT (IOC);  Surgeon: Birder Robson, MD;  Location: [facility] ORS;  Service: Ophthalmology;  Laterality: Left;  [place] 00:23 AP% 14.2 CDE 3.26 Fluid pack lot # [id number]  . CATARACT EXTRACTION W/PHACO Right 09/10/2017   Procedure: CATARACT EXTRACTION PHACO AND INTRAOCULAR LENS PLACEMENT (IOC);  Surgeon: Birder Robson, MD;  Location: [facility] ORS;  Service: Ophthalmology;  Laterality: Right;  [place] 00:26.4 AP% 17.3 CDE 4.59 Fluid Pack Lot # [id number]  . COLONOSCOPY    . CORONARY ANGIOPLASTY     LAD: before CABG  . CORONARY ARTERY  BYPASS GRAFT  09/06/2010   At [facility]: LIMA to LAD, left radial to RCA, sequential SVG to OM3 and 4  . CORONARY STENT INTERVENTION N/A 03/23/2020   Procedure: CORONARY STENT INTERVENTION;  Surgeon: Wellington Hampshire, MD;  Location: [place] [facility];  Service: Cardiovascular;  Laterality: N/A;  RCA  . heart stents  Jan 2011   leg stents 06/2009 and 03/2010  . LOWER EXTREMITY ANGIOGRAPHY Left 06/09/2019   Procedure: LOWER EXTREMITY ANGIOGRAPHY;  Surgeon: Katha Cabal, MD;  Location: [place] [facility];  Service: Cardiovascular;  Laterality: Left;  . PTA of illiac and SFA  multiple   Dr. Ronalee Belts, s/p revision 11/2012  . RIGHT AND LEFT HEART CATH N/A 03/21/2020   Procedure: RIGHT AND LEFT HEART CATH possible percutaneous intervention;  Surgeon: Wellington Hampshire, MD;  Location: [place] [facility];  Service: Cardiovascular;  Laterality: N/A;  . RIGHT/LEFT HEART CATH AND CORONARY/GRAFT ANGIOGRAPHY N/A 02/15/2020   Procedure: RIGHT/LEFT HEART CATH AND CORONARY/GRAFT ANGIOGRAPHY;  Surgeon: [name]  A, MD;  Location: [place] [facility];  Service: Cardiovascular;  Laterality: N/A;  . SPLENECTOMY    . VASCULAR SURGERY     LEG STENTS    Prior to Admission medications   Medication Sig Start Date End Date Taking? Authorizing Provider  albuterol (PROAIR HFA) 108 (90 Base) MCG/ACT inhaler Inhale 2 puffs into the lungs every 6 (six) hours as needed for shortness of breath. 09/14/19   [name], MD  aspirin 81 MG tablet Take 81 mg by mouth daily.    [provider]  busPIRone (BUSPAR) 10 MG tablet TAKE 2 TABLETS(20 MG) BY MOUTH TWICE DAILY Patient taking differently: Take 20 mg by mouth 2 (two) times daily.  02/19/20   [name], MD  busPIRone (BUSPAR) 10 MG tablet Take 20 mg by mouth 2 (two) times daily.    [provider]  calcitRIOL (ROCALTROL) 0.25 MCG capsule Take 0.25 mcg by mouth every Monday, Wednesday, and Friday. Monday then Wednesday then Friday     [provider]  carvedilol (COREG) 12.5 MG tablet Take 1 tablet (12.5 mg total) by mouth 2 (two) times daily. Patient taking differently: Take 12.5 mg by mouth 2 (two) times daily with a meal.  12/10/19   Wellington Hampshire, MD  clopidogrel (PLAVIX) 75 MG tablet Take 1 tablet (75 mg total) by mouth daily. 03/25/20   Sande Rives E, PA-C  cyclobenzaprine (FLEXERIL) 10 MG tablet Take 10 mg by mouth 3 (three) times daily as needed for muscle spasms.    [provider]  Dextran 70-Hypromellose (ARTIFICIAL TEARS) 0.1-0.3 % SOLN Place 1 drop into both eyes 4 (four) times daily as needed (dry eyes).    [provider]  finasteride (PROSCAR) 5 MG tablet Take 5 mg by mouth daily.    [provider]  hydrALAZINE (APRESOLINE) 25 MG tablet Take 1 tablet (25 mg total) by mouth every 8 (eight) hours. 03/25/20   Sande Rives E, PA-C  insulin aspart (NOVOLOG) 100 UNIT/ML injection Inject 0-9 Units into the skin 3 (three) times daily with meals. 03/21/20   Ezekiel Slocumb, DO  isosorbide mononitrate (IMDUR) 30 MG 24 hr tablet Take 3 tablets (90 mg total) by mouth daily. 02/23/20   Dunn, Areta Haber, PA-C  nitroGLYCERIN (NITROSTAT) 0.4 MG SL tablet Place 1 tablet (0.4 mg total) under the tongue every 5 (five) minutes as needed. Maximum of 3 doses. Patient taking differently: Place 0.4 mg under the tongue every 5 (five) minutes as needed for chest pain. Maximum of 3 doses. 02/23/20   Dunn, Areta Haber, PA-C  rosuvastatin (CRESTOR) 10 MG tablet Take 1 tablet (10 mg total) by mouth daily. 03/26/20   Sande Rives E, PA-C  sertraline (ZOLOFT) 100 MG tablet Take 1 tablet (100 mg total) by mouth daily. 02/23/20   [name], MD  tamsulosin (FLOMAX) 0.4 MG CAPS capsule Take 1 capsule by mouth daily Patient taking differently: Take 0.4 mg by mouth daily.  01/20/20   [name], MD  torsemide (DEMADEX) 20 MG tablet Take 2 tablets (40 mg total) by mouth 2 (two) times daily. 04/07/20    Loel Dubonnet, NP    Allergies Contrast media [iodinated diagnostic agents], Glipizide, Hydroxychloroquine, Metrizamide, and Penicillins  Family History  Problem Relation Age of Onset  . Lung cancer Father 34  . Arthritis Father   . Breast cancer Sister 71  . Alcoholism Sister   . Dementia Mother   . Alzheimer's disease Mother   .  Arthritis Mother   . Alcoholism Brother   . Epilepsy Sister   . Diabetes Paternal Grandfather   . Colon polyps Paternal Grandfather 71  . Alcoholism Sister   . Alcoholism Sister   . Alcoholism Brother     Social History Social History   Tobacco Use  . Smoking status: Former Smoker    Packs/day: 0.75    Years: 40.00    Pack years: 30.00    Types: Cigarettes    Quit date: 04/15/2017    Years since quitting: 2.9  . Smokeless tobacco: Never Used  . Tobacco comment: 1 cigarette a day  Vaping Use  . Vaping Use: Never used  Substance Use Topics  . Alcohol use: No  . Drug use: No    Review of Systems Constitutional: No fever/chills Eyes: No visual changes. ENT: No sore throat. Cardiovascular: Endorses chest pain. Respiratory: Denies shortness of breath. Gastrointestinal: No abdominal pain.  No nausea, no vomiting.  No diarrhea. Genitourinary: Negative for dysuria. Musculoskeletal: Negative for acute arthralgias Skin: Negative for rash. Neurological: Negative for headaches, weakness/numbness/paresthesias in any extremity Psychiatric: Negative for suicidal ideation/homicidal ideation   ____________________________________________   PHYSICAL EXAM:  VITAL SIGNS: ED Triage Vitals  Enc Vitals Group     BP 04/09/20 0913 97/76     Pulse Rate 04/09/20 0913 (!) 105     Resp 04/09/20 0913 18     Temp 04/09/20 0915 98.1 F (36.7 C)     Temp Source 04/09/20 0915 Oral     SpO2 04/09/20 0913 100 %     Weight 04/09/20 0913 225 lb (102.1 kg)     Height 04/09/20 0913 5\' 6"  (1.676 m)     Head Circumference --      Peak Flow --      Pain  Score 04/09/20 0913 10     Pain Loc --      Pain Edu? --      Excl. in [place]? --    Constitutional: Alert and oriented. Well appearing and in no acute distress. Eyes: Conjunctival pallor. PERRL. EOMI. Head: Atraumatic. Nose: No congestion/rhinnorhea. Mouth/Throat: Mucous membranes are moist. Neck: No stridor Cardiovascular: Normal rate, regular rhythm. Grossly normal heart sounds.  Good peripheral circulation. Respiratory: Normal respiratory effort.  No retractions. Gastrointestinal: Soft and nontender. No distention. Musculoskeletal: No lower extremity tenderness nor edema.  No joint effusions. Neurologic:  Normal speech and language. No gross focal neurologic deficits are appreciated. Skin:  Skin is warm and dry. No rash noted.  Generalized pallor Psychiatric: Mood and affect are normal. Speech and behavior are normal.  ____________________________________________   LABS (all labs ordered are listed, but only abnormal results are displayed)  Labs Reviewed  CBC - Abnormal; Notable for the following components:      Result Value   RBC 2.02 (*)    Hemoglobin 6.5 (*)    HCT 19.7 (*)    RDW 16.5 (*)    All other components within normal limits  BASIC METABOLIC PANEL  OCCULT BLOOD X 1 CARD TO LAB, STOOL  TYPE AND SCREEN  PREPARE RBC (CROSSMATCH)  TROPONIN I (HIGH SENSITIVITY)   ____________________________________________  EKG  ED ECG REPORT I, Naaman Plummer, the attending physician, personally viewed and interpreted this ECG.  Date: 04/09/2020 EKG Time: 0914 Rate: 105 Rhythm: normal sinus rhythm QRS Axis: normal Intervals: LBBB ST/T Wave abnormalities: normal Narrative Interpretation: no evidence of acute ischemia  ____________________________________________ ______________________________   PROCEDURES  Procedure(s) performed (including Critical Care):  [name]  1-3 Lead EKG Interpretation Performed by: Naaman Plummer, MD Authorized by: Naaman Plummer, MD      Interpretation: abnormal     ECG rate:  105   ECG rate assessment: tachycardic     Rhythm: sinus tachycardia     Ectopy: none     Conduction: normal   .Critical Care Performed by: Naaman Plummer, MD Authorized by: Naaman Plummer, MD   Critical care provider statement:    Critical care time (minutes):  75   Critical care time was exclusive of:  Separately billable procedures and treating other patients   Critical care was necessary to treat or prevent imminent or life-threatening deterioration of the following conditions:  Cardiac failure and circulatory failure   Critical care was time spent personally by me on the following activities:  Discussions with consultants, evaluation of patient's response to treatment, examination of patient, ordering and performing treatments and interventions, ordering and review of laboratory studies, ordering and review of radiographic studies, pulse oximetry, re-evaluation of patient's condition, obtaining history from patient or surrogate and review of old charts   I assumed direction of critical care for this patient from another provider in my specialty: no       ____________________________________________   INITIAL IMPRESSION / [place] / ED COURSE       Patient is a 64 year old male that presents for chest pain. Given history and exam patient's presentation most consistent with Lower GI bleed possibly secondary to hemorrhoid or other nonemergent cause of bleeding. I have low suspicion for Aortoenteric fistula, Upper GI Bleed, IBD, Mesenteric Ischemia, Rectal foreign body or ulcer.  Workup: CBC, BMP, PT/INR, Type and Screen CBC shows hemoglobin of 6.5 with last hemoglobin 9/10 and was 9.8  Patient likely having symptomatic anemia from lower GI bleeding.  Patient not on any blood thinning medications.  Given patient's significant history of heart disease, this anemia could be significant and may cause morbidity/mortality and  therefore will need admission.     ____________________________________________   FINAL CLINICAL IMPRESSION(S) / ED DIAGNOSES  Final diagnoses:  None     ED Discharge Orders    None       Note:  This document was prepared using Dragon voice recognition software and may include unintentional dictation errors.   Naaman Plummer, MD 04/09/20 1120

## 2020-04-09 NOTE — ED Notes (Addendum)
Pt urinated on floor next to toilet and urinal. Pt educated not to urinate on the ground. Pt states "I'll do my best." Pt continuously removes monitoring devices despite education.

## 2020-04-09 NOTE — Consult Note (Signed)
Lucilla Lame, MD [facility]  [address] [place], [place] [address] Phone: [phone] Fax : [phone]  Consultation  Referring Provider:     Dr. Francine Graven Primary Care Physician:  [name], MD Primary Gastroenterologist:  Dr. Hilarie Fredrickson         Reason for Consultation:     Melena  Date of Admission:  04/09/2020 Date of Consultation:  04/09/2020         HPI:   [name] [name] is a 64 y.o. male who was admitted with a history of coronary artery disease anxiety and diabetes with a chest heaviness. The patient states that the symptoms started 6 hours prior to coming to the hospital. He took 3 sublingual nitrates. He also reports that he has been having dark stools since 2 days ago. His last bowel movement was yesterday with black stools. The patient has had an EGD and colonoscopy in the past and he states that he had them approximately 4 years ago with polyps removed and at the same time he had dilation of the esophagus. The patient reports that he has peripheral vascular disease and for that he takes Plavix and he also takes a 81 mg aspirin daily.  The patient denies any NSAID use and states that he only takes Tylenol.  The patient also denies any vomiting up of blood or any bright red blood per rectum.  The patient's hemoglobin in August of this year was 10.1 with his hemoglobin running between 7.1 and 9.4 recently.  The patient's CBC from 8 days ago showed his hemoglobin to be 9.5 at that time.  He was found to have a hemoglobin of 6.5 on admission.  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  Past Medical History:  Diagnosis Date  . 3-vessel CAD    8/30 LHC with radial graft to RCA occluded at the ostium with critical stenosis of the ostial native RCA.  . Adenomatous colon polyp   . Allergy   . Anxiety   . Arthritis    RA  . Bell's palsy 2007  . CAD (coronary artery disease)    03/21/2020  LHC with patent LIMA to LAD and SVG to OM 3.  Radial graft to RCA occluded at the ostium with critical stenosis of the ostial and native RCA.  . Carotid artery occlusion    Bilateral carotid stenosis s/p left-sided CEA.  . Cataract    Dr. Dawna Part  . CKD (chronic kidney disease), stage IV ([place])    03/2020 vascular surgery access planned  . Depression   . Diabetes mellitus   . Diverticulosis   . Duodenitis   . DVT (deep venous thrombosis) ([facility])    in leg  . ESRD (end stage renal disease) ([place])   . Gastropathy   . GERD (gastroesophageal reflux disease)   . Heart failure with preserved ejection fraction ([place])    8/30 RHC with moderately to severely elevated filling pressures and pulmonary wedge pressure 31 mmHg, moderate pulmonary hypertension  . Helicobacter pylori gastritis   . Hx of CABG    2012  . Hyperlipidemia   . Hypertension   . Mitral regurgitation and aortic stenosis    RHC 8/30 and echo 12/2019.  [name] Neuropathy   . Obesity   . Peripheral vascular disease ([place])    S/p extensive revascularization by vascular surgery 05/2019  . Post splenectomy syndrome   .  Psoriasis   . Severe aortic stenosis    03/21/20 RHC with peak gradient 27.4 mmHg and valve area 0.87.  [name] Sleep apnea    uses cpap  . Stroke ([place])   . Tobacco use disorder    recently quit  . Tubular adenoma 08/2013   Dr. Hilarie Fredrickson  . Weak urinary stream     Past Surgical History:  Procedure Laterality Date  . CARDIAC CATHETERIZATION  08/2010   LAD: 80% ISR, RCA: 80% ostial, OM 80-90%  . CAROTID ENDARTERECTOMY  08/2010   left/ Dr. Kellie Simmering  . CATARACT EXTRACTION W/PHACO Left 07/09/2017   Procedure: CATARACT EXTRACTION PHACO AND INTRAOCULAR LENS PLACEMENT (IOC);  Surgeon: Birder Robson, MD;  Location: [facility] ORS;  Service: Ophthalmology;  Laterality: Left;  [place] 00:23 AP% 14.2 CDE 3.26 Fluid pack lot # [id number]  . CATARACT EXTRACTION W/PHACO Right 09/10/2017   Procedure: CATARACT EXTRACTION PHACO AND INTRAOCULAR LENS  PLACEMENT (IOC);  Surgeon: Birder Robson, MD;  Location: [facility] ORS;  Service: Ophthalmology;  Laterality: Right;  [place] 00:26.4 AP% 17.3 CDE 4.59 Fluid Pack Lot # [id number]  . COLONOSCOPY    . CORONARY ANGIOPLASTY     LAD: before CABG  . CORONARY ARTERY BYPASS GRAFT  09/06/2010   At [facility]: LIMA to LAD, left radial to RCA, sequential SVG to OM3 and 4  . CORONARY STENT INTERVENTION N/A 03/23/2020   Procedure: CORONARY STENT INTERVENTION;  Surgeon: Wellington Hampshire, MD;  Location: [place] [facility];  Service: Cardiovascular;  Laterality: N/A;  RCA  . heart stents  Jan 2011   leg stents 06/2009 and 03/2010  . LOWER EXTREMITY ANGIOGRAPHY Left 06/09/2019   Procedure: LOWER EXTREMITY ANGIOGRAPHY;  Surgeon: Katha Cabal, MD;  Location: [place] [facility];  Service: Cardiovascular;  Laterality: Left;  . PTA of illiac and SFA  multiple   Dr. Ronalee Belts, s/p revision 11/2012  . RIGHT AND LEFT HEART CATH N/A 03/21/2020   Procedure: RIGHT AND LEFT HEART CATH possible percutaneous intervention;  Surgeon: Wellington Hampshire, MD;  Location: [place] [facility];  Service: Cardiovascular;  Laterality: N/A;  . RIGHT/LEFT HEART CATH AND CORONARY/GRAFT ANGIOGRAPHY N/A 02/15/2020   Procedure: RIGHT/LEFT HEART CATH AND CORONARY/GRAFT ANGIOGRAPHY;  Surgeon: Wellington Hampshire, MD;  Location: [place] [facility];  Service: Cardiovascular;  Laterality: N/A;  . SPLENECTOMY    . VASCULAR SURGERY     LEG STENTS    Prior to Admission medications   Medication Sig Start Date End Date Taking? Authorizing Provider  albuterol (PROAIR HFA) 108 (90 Base) MCG/ACT inhaler Inhale 2 puffs into the lungs every 6 (six) hours as needed for shortness of breath. 09/14/19   [name], MD  aspirin 81 MG tablet Take 81 mg by mouth daily.    [provider]  busPIRone (BUSPAR) 10 MG tablet TAKE 2 TABLETS(20 MG) BY MOUTH TWICE DAILY Patient taking differently: Take 20 mg by mouth 2 (two) times daily.  02/19/20   [name], MD  calcitRIOL (ROCALTROL) 0.25 MCG capsule Take 0.25 mcg by mouth every Monday, Wednesday, and Friday. Monday then Wednesday then Friday    [provider]  carvedilol (COREG) 12.5 MG tablet Take 1 tablet (12.5 mg total) by mouth 2 (two) times daily. Patient taking differently: Take 12.5 mg by mouth 2 (two) times daily with a meal.  12/10/19   Wellington Hampshire, MD  clopidogrel (PLAVIX) 75 MG tablet Take 1 tablet (75 mg total) by mouth daily. 03/25/20  Sande Rives E, PA-C  cyclobenzaprine (FLEXERIL) 10 MG tablet Take 10 mg by mouth 3 (three) times daily as needed for muscle spasms.    [provider]  Dextran 70-Hypromellose (ARTIFICIAL TEARS) 0.1-0.3 % SOLN Place 1 drop into both eyes 4 (four) times daily as needed (dry eyes).    [provider]  finasteride (PROSCAR) 5 MG tablet Take 5 mg by mouth daily.    [provider]  hydrALAZINE (APRESOLINE) 25 MG tablet Take 1 tablet (25 mg total) by mouth every 8 (eight) hours. 03/25/20   Sande Rives E, PA-C  insulin aspart (NOVOLOG) 100 UNIT/ML injection Inject 0-9 Units into the skin 3 (three) times daily with meals. 03/21/20   Ezekiel Slocumb, DO  isosorbide mononitrate (IMDUR) 30 MG 24 hr tablet Take 3 tablets (90 mg total) by mouth daily. 02/23/20   Dunn, Areta Haber, PA-C  nitroGLYCERIN (NITROSTAT) 0.4 MG SL tablet Place 1 tablet (0.4 mg total) under the tongue every 5 (five) minutes as needed. Maximum of 3 doses. Patient taking differently: Place 0.4 mg under the tongue every 5 (five) minutes as needed for chest pain. Maximum of 3 doses. 02/23/20   Dunn, Areta Haber, PA-C  rosuvastatin (CRESTOR) 10 MG tablet Take 1 tablet (10 mg total) by mouth daily. 03/26/20   Sande Rives E, PA-C  sertraline (ZOLOFT) 100 MG tablet Take 1 tablet (100 mg total) by mouth daily. 02/23/20   [name], MD  tamsulosin (FLOMAX) 0.4 MG CAPS capsule Take 1 capsule by mouth daily Patient taking differently: Take 0.4 mg by  mouth daily.  01/20/20   [name], MD  torsemide (DEMADEX) 20 MG tablet Take 2 tablets (40 mg total) by mouth 2 (two) times daily. 04/07/20   Loel Dubonnet, NP    Family History  Problem Relation Age of Onset  . Lung cancer Father 45  . Arthritis Father   . Breast cancer Sister 43  . Alcoholism Sister   . Dementia Mother   . Alzheimer's disease Mother   . Arthritis Mother   . Alcoholism Brother   . Epilepsy Sister   . Diabetes Paternal Grandfather   . Colon polyps Paternal Grandfather 41  . Alcoholism Sister   . Alcoholism Sister   . Alcoholism Brother      Social History   Tobacco Use  . Smoking status: Former Smoker    Packs/day: 0.75    Years: 40.00    Pack years: 30.00    Types: Cigarettes    Quit date: 04/15/2017    Years since quitting: 2.9  . Smokeless tobacco: Never Used  . Tobacco comment: 1 cigarette a day  Vaping Use  . Vaping Use: Never used  Substance Use Topics  . Alcohol use: No  . Drug use: No    Allergies as of 04/09/2020 - Review Complete 04/09/2020  Allergen Reaction Noted  . Contrast media [iodinated diagnostic agents] Rash and Other (See Comments) 08/06/2011  . Glipizide Other (See Comments) 03/05/2011  . Hydroxychloroquine Other (See Comments) 03/11/2020  . Metrizamide Other (See Comments) 03/25/2014  . Penicillins Hives and Swelling 08/23/2010    Review of Systems:    All systems reviewed and negative except where noted in HPI.   Physical Exam:  Vital signs in last 24 hours: Temp:  [98.1 F (36.7 C)-98.2 F (36.8 C)] 98.2 F (36.8 C) (09/18 1130) Pulse Rate:  [103-105] 103 (09/18 1244) Resp:  [18-26] 21 (09/18 1244) BP: (79-125)/(53-80) 125/77 (09/18 1215)  SpO2:  [60 %-100 %] 97 % (09/18 1146) Weight:  [102.1 kg] 102.1 kg (09/18 0913)   General:   Pleasant, cooperative in NAD Head:  Normocephalic and atraumatic. Eyes:   No icterus.   Conjunctiva pink. PERRLA. Ears:  Normal auditory acuity. Neck:  Supple; no masses  or thyroidomegaly Lungs: Respirations even and unlabored. Lungs clear to auscultation bilaterally.   No wheezes, crackles, or rhonchi.  Heart:  Regular rate and rhythm;  Without murmur, clicks, rubs or gallops Abdomen:  Soft, nondistended, nontender. Normal bowel sounds. No appreciable masses or hepatomegaly.  No rebound or guarding.  Rectal:  Not performed. Msk:  Symmetrical without gross deformities.    Extremities:  Without edema, cyanosis or clubbing. Neurologic:  Alert and oriented x3;  grossly normal neurologically. Skin:  Intact without significant lesions or rashes.  Pale Cervical Nodes:  No significant cervical adenopathy. Psych:  Alert and cooperative. Normal affect.  LAB RESULTS: Recent Labs    04/09/20 0916  WBC 10.3  HGB 6.5*  HCT 19.7*  PLT 162   BMET Recent Labs    04/09/20 0916  NA 135  K 4.7  CL 97*  CO2 22  GLUCOSE 252*  BUN 94*  CREATININE 3.99*  CALCIUM 8.5*   LFT No results for input(s): PROT, ALBUMIN, AST, ALT, ALKPHOS, BILITOT, BILIDIR, IBILI in the last 72 hours. PT/INR No results for input(s): LABPROT, INR in the last 72 hours.  STUDIES: DG Chest 2 View  Result Date: 04/09/2020 CLINICAL DATA:  Shortness of breath. History of heart disease. Former smoker. EXAM: CHEST - 2 VIEW COMPARISON:  March 20, 2020 FINDINGS: The heart, hila, and mediastinum are normal. Coarsened interstitial lung markings remain but are improved. Mildly more focal opacities in the bases, left greater than right. No pneumothorax. No nodules or masses. No other abnormalities. IMPRESSION: 1. Coarsened lung markings have improved but persists consistent with bronchitic change versus atypical infection. Pulmonary venous congestion possible. 2. More focal opacities in the bases are mild and may simply represent atelectasis. Early infiltrates possible. Recommend attention on follow-up. Electronically Signed   By: Dorise Bullion III M.D   On: 04/09/2020 10:18      Impression /  Plan:   Assessment: Active Problems:   Acute blood loss anemia   [name] [name] is a 64 y.o. y/o male with who comes in today with a hemoglobin of 6.5 with a hemoglobin 8 days ago of 9.4.  The patient has had black stools for the last 2 days.  He also has been having some chest discomfort and feeling weak.  The patient's last black stool was yesterday and he has had no further episodes of black stool.  The patient was admitted with symptomatic anemia and a GI consult was called.  Plan:  The patient should be transfused and stabilized prior to having any endoscopic procedures.  The patient will likely need an EGD and is reporting that he does not drive and cannot make it to his primary gastroenterologist for a colonoscopy and is requesting that he have a colonoscopy at the same time as his EGD while he is in the hospital.  The patient has been told that we will have to see how stable he is from a symptomatic anemia and cardiology point of view before we plan on any procedures at this time.  PPI IV twice daily  Continue serial CBCs and transfuse PRN Avoid NSAIDs Maintain 2 large-bore IV lines Please page GI with any acute hemodynamic changes,  or signs of active GI bleeding   Thank you for involving me in the care of this patient.      LOS: 0 days   Lucilla Lame, MD, [facility] 04/09/2020, 1:26 PM,  Pager [phone] 7am-5pm  Check AMION for 5pm -7am coverage and on weekends   Note: This dictation was prepared with Dragon dictation along with smaller phrase technology. Any transcriptional errors that result from this process are unintentional.

## 2020-04-09 NOTE — ED Notes (Signed)
Pt given toiletry items per request

## 2020-04-09 NOTE — H&P (Signed)
History and Physical    [place] WUJ:[id number] DOB: 09/27/1955 DOA: 04/09/2020  PCP: [name], MD   Patient coming from: Home  I have personally briefly reviewed patient's old medical records in [place]  Chief Complaint: Chest pain  HPI: [name] [name] is a 64 y.o. male with medical history significant for coronary artery disease status post three-vessel CABG, status post recent PCI with stent angioplasty, history of diabetes, stage IV chronic kidney disease and hypertension who presents to the ER for evaluation of chest pain that started in the early hours of the morning prior to his admission.  Patient states that chest pain started at rest was mostly over the left anterior chest wall and was nonradiating non radiating.  He rated his chest pain an 8 x 10 in intensity at its worst, worse with exertion and associated with nausea and diaphoresis but he denied having any vomiting, shortness of breath or palpitations.  Chest pain does not feel like his prior episodes of heart attack and it was not relieved by nitroglycerin. Patient states that he has had black stools for about 2 days and complains of feeling very weak but denies feeling dizzy or lightheaded.  He denies having any falls or loss of consciousness. He denies any NSAID use and denies having any hematemesis or hematochezia.  He has no abdominal pain. Labs show sodium 135, potassium 4.7, chloride 97, bicarb 22, glucose 252, BUN 94, creatinine 3.9, calcium 9.5, troponin 129 >> 223, white count 10.3, hemoglobin 6.5, hematocrit 19.7, MCV 97.5, RDW 16.5, platelet count 162 Chest x-ray reviewed by me shows pulmonary venous congestion Twelve-lead EKG reviewed by me shows sinus tachycardia with a left bundle branch block   ED Course: Patient is a 63 year old male with a history of diabetes mellitus with complications of stage IV chronic kidney disease, history of coronary artery disease status post CABG status post  stent angioplasty who presents to the ER for evaluation of chest pain.  He is noted to have a drop in his hemoglobin from 9.4g/dl about a week ago to 6.5g/dl.  He also gives a 2-day history of passage of melena stools.  Twelve-lead EKG does not show any acute findings.  Patient received 2 units of packed RBC in the ER.  Patient will be admitted to the hospital for further evaluation.  Review of Systems: As per HPI otherwise 10 point review of systems negative.    Past Medical History:  Diagnosis Date  . 3-vessel CAD    8/30 LHC with radial graft to RCA occluded at the ostium with critical stenosis of the ostial native RCA.  . Adenomatous colon polyp   . Allergy   . Anxiety   . Arthritis    RA  . Bell's palsy 2007  . CAD (coronary artery disease)    03/21/2020 LHC with patent LIMA to LAD and SVG to OM 3.  Radial graft to RCA occluded at the ostium with critical stenosis of the ostial and native RCA.  . Carotid artery occlusion    Bilateral carotid stenosis s/p left-sided CEA.  . Cataract    Dr. Dawna Part  . CKD (chronic kidney disease), stage IV ([place])    03/2020 vascular surgery access planned  . Depression   . Diabetes mellitus   . Diverticulosis   . Duodenitis   . DVT (deep venous thrombosis) (HCC)    in leg  . ESRD (end stage renal disease) ([place])   . Gastropathy   .  GERD (gastroesophageal reflux disease)   . Heart failure with preserved ejection fraction ([place])    8/30 RHC with moderately to severely elevated filling pressures and pulmonary wedge pressure 31 mmHg, moderate pulmonary hypertension  . Helicobacter pylori gastritis   . Hx of CABG    2012  . Hyperlipidemia   . Hypertension   . Mitral regurgitation and aortic stenosis    RHC 8/30 and echo 12/2019.  [name] Neuropathy   . Obesity   . Peripheral vascular disease ([place])    S/p extensive revascularization by vascular surgery 05/2019  . Post splenectomy syndrome   . Psoriasis   . Severe aortic stenosis    03/21/20 RHC with  peak gradient 27.4 mmHg and valve area 0.87.  [name] Sleep apnea    uses cpap  . Stroke ([place])   . Tobacco use disorder    recently quit  . Tubular adenoma 08/2013   Dr. Hilarie Fredrickson  . Weak urinary stream     Past Surgical History:  Procedure Laterality Date  . CARDIAC CATHETERIZATION  08/2010   LAD: 80% ISR, RCA: 80% ostial, OM 80-90%  . CAROTID ENDARTERECTOMY  08/2010   left/ Dr. Kellie Simmering  . CATARACT EXTRACTION W/PHACO Left 07/09/2017   Procedure: CATARACT EXTRACTION PHACO AND INTRAOCULAR LENS PLACEMENT (IOC);  Surgeon: Birder Robson, MD;  Location: [facility];  Service: Ophthalmology;  Laterality: Left;  [place] 00:23 AP% 14.2 CDE 3.26 Fluid pack lot # [id number]  . CATARACT EXTRACTION W/PHACO Right 09/10/2017   Procedure: CATARACT EXTRACTION PHACO AND INTRAOCULAR LENS PLACEMENT (IOC);  Surgeon: Birder Robson, MD;  Location: [facility] OR[facility];  Service: Ophthalmology;  Laterality: Right;  [place] 00:26.4 AP% 17.3 CDE 4.59 Fluid Pack Lot # [id number]  . COLONOSCOPY    . CORONARY ANGIOPLASTY     LAD: before CABG  . CORONARY ARTERY BYPASS GRAFT  09/06/2010   At [facility]: LIMA to LAD, left radial to RCA, sequential SVG to OM3 and 4  . CORONARY STENT INTERVENTION N/A 03/23/2020   Procedure: CORONARY STENT INTERVENTION;  Surgeon: Wellington Hampshire, MD;  Location: [place] [facility];  Service: Cardiovascular;  Laterality: N/A;  RCA  . heart stents  Jan 2011   leg stents 06/2009 and 03/2010  . LOWER EXTREMITY ANGIOGRAPHY Left 06/09/2019   Procedure: LOWER EXTREMITY ANGIOGRAPHY;  Surgeon: Katha Cabal, MD;  Location: [place] [facility];  Service: Cardiovascular;  Laterality: Left;  . PTA of illiac and SFA  multiple   Dr. Ronalee Belts, s/p revision 11/2012  . RIGHT AND LEFT HEART CATH N/A 03/21/2020   Procedure: RIGHT AND LEFT HEART CATH possible percutaneous intervention;  Surgeon: Wellington Hampshire, MD;  Location: [place] [facility];  Service: Cardiovascular;  Laterality: N/A;  . RIGHT/LEFT HEART CATH AND  CORONARY/GRAFT ANGIOGRAPHY N/A 02/15/2020   Procedure: RIGHT/LEFT HEART CATH AND CORONARY/GRAFT ANGIOGRAPHY;  Surgeon: Wellington Hampshire, MD;  Location: [place] [facility];  Service: Cardiovascular;  Laterality: N/A;  . SPLENECTOMY    . VASCULAR SURGERY     LEG STENTS     reports that he quit smoking about 2 years ago. His smoking use included cigarettes. He has a 30.00 pack-year smoking history. He has never used smokeless tobacco. He reports that he does not drink alcohol and does not use drugs.  Allergies  Allergen Reactions  . Contrast Media [Iodinated Diagnostic Agents] Rash and Other (See Comments)    Got very hot and red   . Glipizide Other (See Comments)    ANTIDIABETICS. Burning  .  Hydroxychloroquine Other (See Comments)    Stomach upset  . Metrizamide Other (See Comments)    Got very hot and red  . Penicillins Hives and Swelling    Has patient had a PCN reaction causing immediate rash, facial/tongue/throat swelling, SOB or lightheadedness with hypotension: yes Has patient had a PCN reaction causing severe rash involving mucus membranes or skin necrosis: no  Has patient had a PCN reaction that required hospitalization: yes Has patient had a PCN reaction occurring within the last 10 years: no If all of the above answers are "NO", then may proceed with Cephalosporin use.     Family History  Problem Relation Age of Onset  . Lung cancer Father 48  . Arthritis Father   . Breast cancer Sister 54  . Alcoholism Sister   . Dementia Mother   . Alzheimer's disease Mother   . Arthritis Mother   . Alcoholism Brother   . Epilepsy Sister   . Diabetes Paternal Grandfather   . Colon polyps Paternal Grandfather 16  . Alcoholism Sister   . Alcoholism Sister   . Alcoholism Brother      Prior to Admission medications   Medication Sig Start Date End Date Taking? Authorizing Provider  albuterol (PROAIR HFA) 108 (90 Base) MCG/ACT inhaler Inhale 2 puffs into the lungs every 6 (six)  hours as needed for shortness of breath. 09/14/19   [name], MD  aspirin 81 MG tablet Take 81 mg by mouth daily.    [provider]  busPIRone (BUSPAR) 10 MG tablet TAKE 2 TABLETS(20 MG) BY MOUTH TWICE DAILY Patient taking differently: Take 20 mg by mouth 2 (two) times daily.  02/19/20   [name], MD  calcitRIOL (ROCALTROL) 0.25 MCG capsule Take 0.25 mcg by mouth every Monday, Wednesday, and Friday. Monday then Wednesday then Friday    [provider]  carvedilol (COREG) 12.5 MG tablet Take 1 tablet (12.5 mg total) by mouth 2 (two) times daily. Patient taking differently: Take 12.5 mg by mouth 2 (two) times daily with a meal.  12/10/19   Wellington Hampshire, MD  clopidogrel (PLAVIX) 75 MG tablet Take 1 tablet (75 mg total) by mouth daily. 03/25/20   Sande Rives E, PA-C  cyclobenzaprine (FLEXERIL) 10 MG tablet Take 10 mg by mouth 3 (three) times daily as needed for muscle spasms.    [provider]  Dextran 70-Hypromellose (ARTIFICIAL TEARS) 0.1-0.3 % SOLN Place 1 drop into both eyes 4 (four) times daily as needed (dry eyes).    [provider]  finasteride (PROSCAR) 5 MG tablet Take 5 mg by mouth daily.    [provider]  hydrALAZINE (APRESOLINE) 25 MG tablet Take 1 tablet (25 mg total) by mouth every 8 (eight) hours. 03/25/20   Sande Rives E, PA-C  insulin aspart (NOVOLOG) 100 UNIT/ML injection Inject 0-9 Units into the skin 3 (three) times daily with meals. 03/21/20   Ezekiel Slocumb, DO  isosorbide mononitrate (IMDUR) 30 MG 24 hr tablet Take 3 tablets (90 mg total) by mouth daily. 02/23/20   Dunn, Areta Haber, PA-C  nitroGLYCERIN (NITROSTAT) 0.4 MG SL tablet Place 1 tablet (0.4 mg total) under the tongue every 5 (five) minutes as needed. Maximum of 3 doses. Patient taking differently: Place 0.4 mg under the tongue every 5 (five) minutes as needed for chest pain. Maximum of 3 doses. 02/23/20   [name], PA-C  rosuvastatin (CRESTOR) 10 MG  tablet Take 1 tablet (10 mg total)  by mouth daily. 03/26/20   Sande Rives E, PA-C  sertraline (ZOLOFT) 100 MG tablet Take 1 tablet (100 mg total) by mouth daily. 02/23/20   [name], MD  tamsulosin (FLOMAX) 0.4 MG CAPS capsule Take 1 capsule by mouth daily Patient taking differently: Take 0.4 mg by mouth daily.  01/20/20   [name], MD  torsemide (DEMADEX) 20 MG tablet Take 2 tablets (40 mg total) by mouth 2 (two) times daily. 04/07/20   Loel Dubonnet, NP    Physical Exam: Vitals:   04/09/20 1230 04/09/20 1244 04/09/20 1300 04/09/20 1330  BP:    127/78  Pulse:  (!) 103    Resp: 19 (!) 21 14   Temp:      TempSrc:  Oral    SpO2:    97%  Weight:      Height:         Vitals:   04/09/20 1230 04/09/20 1244 04/09/20 1300 04/09/20 1330  BP:    127/78  Pulse:  (!) 103    Resp: 19 (!) 21 14   Temp:      TempSrc:  Oral    SpO2:    97%  Weight:      Height:        Constitutional: NAD, alert and oriented x 3.  Chronically ill-appearing Eyes: PERRL, lids and conjunctivae pallor ENMT: Mucous membranes are moist.  Neck: normal, supple, no masses, no thyromegaly Respiratory: , no wheezing, faint crackles at the bases. Normal respiratory effort. No accessory muscle use.  Cardiovascular: Regular rate and rhythm, no murmurs / rubs / gallops. 2+ extremity edema. 2+ pedal pulses. No carotid bruits.  Abdomen: no tenderness, no masses palpated. No hepatosplenomegaly. Bowel sounds positive.  Central adiposity Musculoskeletal: no clubbing / cyanosis. No joint deformity upper and lower extremities.  Skin: no rashes, lesions, ulcers.  Neurologic: No gross focal neurologic deficit. Psychiatric: Normal mood and flat affect.   Labs on Admission: I have personally reviewed following labs and imaging studies  CBC: Recent Labs  Lab 04/09/20 0916  WBC 10.3  HGB 6.5*  HCT 19.7*  MCV 97.5  PLT 263   Basic Metabolic Panel: Recent Labs  Lab 04/09/20 0916  NA 135  K 4.7    CL 97*  CO2 22  GLUCOSE 252*  BUN 94*  CREATININE 3.99*  CALCIUM 8.5*   GFR: Estimated Creatinine Clearance: 20.4 mL/min (A) (by C-G formula based on SCr of 3.99 mg/dL (H)). Liver Function Tests: No results for input(s): AST, ALT, ALKPHOS, BILITOT, PROT, ALBUMIN in the last 168 hours. No results for input(s): LIPASE, AMYLASE in the last 168 hours. No results for input(s): AMMONIA in the last 168 hours. Coagulation Profile: No results for input(s): INR, PROTIME in the last 168 hours. Cardiac Enzymes: No results for input(s): CKTOTAL, CKMB, CKMBINDEX, TROPONINI in the last 168 hours. BNP (last 3 results) No results for input(s): PROBNP in the last 8760 hours. HbA1C: No results for input(s): HGBA1C in the last 72 hours. CBG: No results for input(s): GLUCAP in the last 168 hours. Lipid Profile: No results for input(s): CHOL, HDL, LDLCALC, TRIG, CHOLHDL, LDLDIRECT in the last 72 hours. Thyroid Function Tests: No results for input(s): TSH, T4TOTAL, FREET4, T3FREE, THYROIDAB in the last 72 hours. Anemia Panel: No results for input(s): VITAMINB12, FOLATE, FERRITIN, TIBC, IRON, RETICCTPCT in the last 72 hours. Urine analysis:    Component Value Date/Time   COLORURINE YELLOW (A) 02/08/2020 1106   APPEARANCEUR CLEAR (A)  02/08/2020 1106   APPEARANCEUR Clear 06/13/2015 1012   LABSPEC 1.011 02/08/2020 1106   PHURINE 7.0 02/08/2020 1106   GLUCOSEU NEGATIVE 02/08/2020 1106   HGBUR NEGATIVE 02/08/2020 1106   [place] 02/08/2020 1106   BILIRUBINUR Negative 06/13/2015 1012   KETONESUR NEGATIVE 02/08/2020 1106   PROTEINUR 100 (A) 02/08/2020 1106   UROBILINOGEN 0.2 12/21/2014 1336   NITRITE NEGATIVE 02/08/2020 1106   LEUKOCYTESUR NEGATIVE 02/08/2020 1106    Radiological Exams on Admission: DG Chest 2 View  Result Date: 04/09/2020 CLINICAL DATA:  Shortness of breath. History of heart disease. Former smoker. EXAM: CHEST - 2 VIEW COMPARISON:  March 20, 2020 FINDINGS: The  heart, hila, and mediastinum are normal. Coarsened interstitial lung markings remain but are improved. Mildly more focal opacities in the bases, left greater than right. No pneumothorax. No nodules or masses. No other abnormalities. IMPRESSION: 1. Coarsened lung markings have improved but persists consistent with bronchitic change versus atypical infection. Pulmonary venous congestion possible. 2. More focal opacities in the bases are mild and may simply represent atelectasis. Early infiltrates possible. Recommend attention on follow-up. Electronically Signed   By: Dorise Bullion III M.D   On: 04/09/2020 10:18    EKG: Independently reviewed.  Sinus tachycardia Left bundle branch block   Assessment/Plan Principal Problem:   Acute blood loss anemia Active Problems:   3-vessel CAD   CKD stage 4 due to type 2 diabetes mellitus (HCC)   Chronic combined systolic and diastolic CHF (congestive heart failure) (HCC)   CKD (chronic kidney disease), stage IV (HCC)   Depression   Aortic stenosis     Acute blood loss anemia From a gastrointestinal source Patient has had 2 days of passage of melena stools but denies having any abdominal pain He is on aspirin and Plavix following recent stent angioplasty Patient has been transfused 2 units of packed RBC in the ER We will place patient on IV PPI We will request GI consult for   Coronary artery disease Patient has a history of coronary artery disease and is status post three-vessel CABG as well as recent stent angioplasty for which he was placed on dual antiplatelet therapy We will hold aspirin and Plavix for now due to acute blood loss anemia Patient presented for evaluation of chest pain and noted to have bumped his troponin from 129 >> 233 Elevated troponin appears to be secondary to demand ischemia from anemia We will consult cardiology for further recommendation    Chronic combined systolic and diastolic dysfunction CHF Last known LVEF of  30 to 35% We will place patient on IV diuretics    Diabetes mellitus with complications of stage IV chronic kidney disease We will keep patient n.p.o. until seen by GI Sliding scale coverage with insulin Renal function appears stable at this time We will request nephrology consult   History of aortic stenosis  Severe Monitor patient closely for signs of fluid overload Follow-up with cardiology as an outpatient    Depression Hold all antidepressants for now since patient is n.p.o.    DVT prophylaxis: SCD Code Status: Full code Family Communication: Greater than 50% of time was spent discussing plan of care with patient at the bedside.  He verbalizes understanding and agrees with the plan.  CODE STATUS was discussed and he wishes to be a full code. Disposition Plan: Back to previous home environment Consults called: Cardiology/nephrology/gastroenterology    [name] Bramble MD [facility]     04/09/2020, 1:50 PM

## 2020-04-09 NOTE — Progress Notes (Signed)
[facility]  ROUNDING NOTE   Subjective:   [name] was admitted to [facility] on 04/09/2020 for Acute blood loss anemia [D62]  Patient was seen by me in the office on 9/16.   Objective:  Vital signs in last 24 hours:  Temp:  [98.1 F (36.7 C)-98.2 F (36.8 C)] 98.2 F (36.8 C) (09/18 1130) Pulse Rate:  [103-105] 103 (09/18 1244) Resp:  [14-26] 14 (09/18 1300) BP: (79-127)/(53-80) 127/78 (09/18 1330) SpO2:  [60 %-100 %] 97 % (09/18 1330) Weight:  [102.1 kg] 102.1 kg (09/18 0913)  Weight change:  Filed Weights   04/09/20 0913  Weight: 102.1 kg    Intake/Output: No intake/output data recorded.   Intake/Output this shift:  Total I/O In: 360 [I.V.:50; Blood:310] Out: -   Physical Exam: General: NAD,   Head: Normocephalic, atraumatic. Moist oral mucosal membranes  Eyes: Anicteric, PERRL  Neck: Supple, trachea midline  Lungs:  Clear to auscultation  Heart: Regular rate and rhythm  Abdomen:  Soft, nontender,   Extremities:  no peripheral edema.  Neurologic: Nonfocal, moving all four extremities  Skin: No lesions  Access: none    Basic Metabolic Panel: Recent Labs  Lab 04/09/20 0916  NA 135  K 4.7  CL 97*  CO2 22  GLUCOSE 252*  BUN 94*  CREATININE 3.99*  CALCIUM 8.5*    Liver Function Tests: No results for input(s): AST, ALT, ALKPHOS, BILITOT, PROT, ALBUMIN in the last 168 hours. No results for input(s): LIPASE, AMYLASE in the last 168 hours. No results for input(s): AMMONIA in the last 168 hours.  CBC: Recent Labs  Lab 04/09/20 0916  WBC 10.3  HGB 6.5*  HCT 19.7*  MCV 97.5  PLT 162    Cardiac Enzymes: No results for input(s): CKTOTAL, CKMB, CKMBINDEX, TROPONINI in the last 168 hours.  BNP: Invalid input(s): POCBNP  CBG: No results for input(s): GLUCAP in the last 168 hours.  Microbiology: Results for orders placed or performed during the hospital encounter of 04/09/20  SARS Coronavirus 2 by RT PCR (hospital order,  performed in [facility] hospital lab) Nasopharyngeal Nasopharyngeal Swab     Status: None   Collection Time: 04/09/20 11:12 AM   Specimen: Nasopharyngeal Swab  Result Value Ref Range Status   SARS Coronavirus 2 NEGATIVE NEGATIVE Final    Comment: (NOTE) SARS-CoV-2 target nucleic acids are NOT DETECTED.  The SARS-CoV-2 RNA is generally detectable in upper and lower respiratory specimens during the acute phase of infection. The lowest concentration of SARS-CoV-2 viral copies this assay can detect is 250 copies / mL. A negative result does not preclude SARS-CoV-2 infection and should not be used as the sole basis for treatment or other patient management decisions.  A negative result may occur with improper specimen collection / handling, submission of specimen other than nasopharyngeal swab, presence of viral mutation(s) within the areas targeted by this assay, and inadequate number of viral copies (<250 copies / mL). A negative result must be combined with clinical observations, patient history, and epidemiological information.  Fact Sheet for Patients:   StrictlyIdeas.no  Fact Sheet for Healthcare Providers: BankingDealers.co.za  This test is not yet approved or  cleared by the [place] FDA and has been authorized for detection and/or diagnosis of SARS-CoV-2 by FDA under an Emergency Use Authorization (EUA).  This EUA will remain in effect (meaning this test can be used) for the duration of the COVID-19 declaration under Section 564(b)(1) of the Act, 21 U.S.C. section  360bbb-3(b)(1), unless the authorization is terminated or revoked sooner.  Performed at [facility], [address] [place], [place] [address]     Coagulation Studies: No results for input(s): LABPROT, INR in the last 72 hours.  Urinalysis: No results for input(s): COLORURINE, LABSPEC, PHURINE, GLUCOSEU, HGBUR, BILIRUBINUR, KETONESUR, PROTEINUR,  UROBILINOGEN, NITRITE, LEUKOCYTESUR in the last 72 hours.  Invalid input(s): APPERANCEUR    Imaging: DG Chest 2 View  Result Date: 04/09/2020 CLINICAL DATA:  Shortness of breath. History of heart disease. Former smoker. EXAM: CHEST - 2 VIEW COMPARISON:  March 20, 2020 FINDINGS: The heart, hila, and mediastinum are normal. Coarsened interstitial lung markings remain but are improved. Mildly more focal opacities in the bases, left greater than right. No pneumothorax. No nodules or masses. No other abnormalities. IMPRESSION: 1. Coarsened lung markings have improved but persists consistent with bronchitic change versus atypical infection. Pulmonary venous congestion possible. 2. More focal opacities in the bases are mild and may simply represent atelectasis. Early infiltrates possible. Recommend attention on follow-up. Electronically Signed   By: Dorise Bullion III M.D   On: 04/09/2020 10:18     Medications:   . sodium chloride     . furosemide  40 mg Intravenous Once  . [START ON 04/10/2020] furosemide  40 mg Intravenous Q12H  . insulin aspart  0-15 Units Subcutaneous Q4H  . pantoprazole (PROTONIX) IV  40 mg Intravenous Q12H   Dextran 70-Hypromellose, ipratropium-albuterol, ondansetron **OR** ondansetron (ZOFRAN) IV  Assessment/ Plan:  [name] is a 64 y.o. white male with peripheral vascular disease, diabetes mellitus type 2 insulin dependent, hypertension, major depressive disorder, tobacco use, psoriasis, diabetic neuropathy, splenectomy, carotid artery stenosis, coronary artery disease, hyperlipidemia, obstructive sleep apnea, history of DVT, CVA admitted to [facility] on 04/09/2020 for Acute blood loss anemia [D62]  1. Acute renal failure on chronic kidney disease stage IV with proteinuria and hyperkalemia:  Recent acute renal failure from IV contrast nephropathy.  Now acute renal failure from acute blood loss anemia.  Chronic kidney disease secondary to diabetic nephropathy,  NSAID induced nephropathy and hypertensive nephrosclerosis. Potassium at goal.  No acute indication for dialysis.  - holding losartan  2. Hypertension: Home regimen of torsemide, carvedilol, tamsulosin   3. Diabetes Mellitus type II Insulin Dependent with chronic kidney disease: insulin dependent. hemoglobin A1c 5.7% on 02/13/20  4. Secondary Hyperparathyroidism: PTH elevated at 133. Calcium and phosphorus at goal.  Not currently on a phosphorus binder.  - Continue calcitriol  5. Anemia with chronic kidney disease: and now with GI bleed Status post PRBC transfusion on admission - Appreciate cardiology and gastroenterology input.    LOS: 0 Mohan Erven 9/18/20212:10 PM

## 2020-04-10 ENCOUNTER — Encounter: Payer: Self-pay | Admitting: Internal Medicine

## 2020-04-10 LAB — BASIC METABOLIC PANEL
Anion gap: 13 (ref 5–15)
BUN: 100 mg/dL — ABNORMAL HIGH (ref 8–23)
CO2: 25 mmol/L (ref 22–32)
Calcium: 8.7 mg/dL — ABNORMAL LOW (ref 8.9–10.3)
Chloride: 96 mmol/L — ABNORMAL LOW (ref 98–111)
Creatinine, Ser: 4.03 mg/dL — ABNORMAL HIGH (ref 0.61–1.24)
GFR calc Af Amer: 17 mL/min — ABNORMAL LOW (ref >60)
GFR calc non Af Amer: 14 mL/min — ABNORMAL LOW (ref >60)
Glucose, Bld: 189 mg/dL — ABNORMAL HIGH (ref 70–99)
Potassium: 4.3 mmol/L (ref 3.5–5.1)
Sodium: 134 mmol/L — ABNORMAL LOW (ref 135–145)

## 2020-04-10 LAB — HEMOGLOBIN AND HEMATOCRIT, BLOOD
HCT: 24.3 % — ABNORMAL LOW (ref 39.0–52.0)
Hemoglobin: 8 g/dL — ABNORMAL LOW (ref 13.0–17.0)

## 2020-04-10 LAB — CBC
HCT: 23.3 % — ABNORMAL LOW (ref 39.0–52.0)
Hemoglobin: 7.7 g/dL — ABNORMAL LOW (ref 13.0–17.0)
MCH: 30.9 pg (ref 26.0–34.0)
MCHC: 33 g/dL (ref 30.0–36.0)
MCV: 93.6 fL (ref 80.0–100.0)
Platelets: 160 10*3/uL (ref 150–400)
RBC: 2.49 MIL/uL — ABNORMAL LOW (ref 4.22–5.81)
RDW: 17.2 % — ABNORMAL HIGH (ref 11.5–15.5)
WBC: 8.7 10*3/uL (ref 4.0–10.5)
nRBC: 0 % (ref 0.0–0.2)

## 2020-04-10 LAB — GLUCOSE, CAPILLARY
Glucose-Capillary: 136 mg/dL — ABNORMAL HIGH (ref 70–99)
Glucose-Capillary: 160 mg/dL — ABNORMAL HIGH (ref 70–99)
Glucose-Capillary: 149 mg/dL — ABNORMAL HIGH (ref 70–99)
Glucose-Capillary: 161 mg/dL — ABNORMAL HIGH (ref 70–99)
Glucose-Capillary: 139 mg/dL — ABNORMAL HIGH (ref 70–99)

## 2020-04-10 MED ORDER — CARVEDILOL 12.5 MG PO TABS
12.5000 mg | ORAL_TABLET | Freq: Two times a day (BID) | ORAL | Status: DC
  Administered 2020-04-11 – 2020-04-12 (×2): 12.5 mg via ORAL
  Filled 2020-04-10 (×3): qty 1

## 2020-04-10 MED ORDER — CALCITRIOL 0.25 MCG PO CAPS
0.2500 ug | ORAL_CAPSULE | Freq: Every day | ORAL | Status: DC
  Administered 2020-04-10 – 2020-04-12 (×3): 0.25 ug via ORAL
  Filled 2020-04-10 (×3): qty 1

## 2020-04-10 MED ORDER — SODIUM CHLORIDE 0.9% IV SOLUTION: Freq: Once | INTRAVENOUS | Status: DC

## 2020-04-10 MED ORDER — ISOSORBIDE MONONITRATE ER 30 MG PO TB24
30.0000 mg | ORAL_TABLET | Freq: Every day | ORAL | Status: DC
  Administered 2020-04-10 – 2020-04-12 (×3): 30 mg via ORAL
  Filled 2020-04-10 (×3): qty 1

## 2020-04-10 MED ORDER — ROSUVASTATIN CALCIUM 10 MG PO TABS
10.0000 mg | ORAL_TABLET | Freq: Every day | ORAL | Status: DC
  Administered 2020-04-10 – 2020-04-12 (×3): 10 mg via ORAL
  Filled 2020-04-10 (×3): qty 1

## 2020-04-10 MED ORDER — TORSEMIDE 20 MG PO TABS
40.0000 mg | ORAL_TABLET | Freq: Every day | ORAL | Status: DC
  Administered 2020-04-10 – 2020-04-12 (×3): 40 mg via ORAL
  Filled 2020-04-10 (×3): qty 2

## 2020-04-10 MED ORDER — CYCLOBENZAPRINE HCL 10 MG PO TABS
10.0000 mg | ORAL_TABLET | Freq: Three times a day (TID) | ORAL | Status: DC | PRN
  Administered 2020-04-10 – 2020-04-12 (×3): 10 mg via ORAL
  Filled 2020-04-10 (×4): qty 1

## 2020-04-10 NOTE — Progress Notes (Signed)
cpap initiated on pressure of 14 cm which pt states is his home setting.

## 2020-04-10 NOTE — Progress Notes (Signed)
Patient scored as a high fall risk. Patient addiment about not having bed alarm turned own. Tired to educate patient and explain the reason for having alarm for his safety. However patient still refusing to have alarm turned on. Instructed patient to call if he feels that he needs staff to help.

## 2020-04-10 NOTE — Progress Notes (Signed)
Lucilla Lame, MD [facility]   [address] [place], [place] [address] Phone: [phone] Fax : [phone]   Subjective: The patient has had no further sign of any acute GI bleeding.  The patient has acute on chronic anemia. The patient had a hemoglobin of 8.0 today up from 7.6 yesterday.  He denies any abdominal pain nausea vomiting fevers or chills.   Objective: Vital signs in last 24 hours: Vitals:   04/10/20 0729 04/10/20 1141 04/10/20 1417 04/10/20 1542  BP: 139/73 138/88 (!) 153/131   Pulse: 90 88 89   Resp: 18 18 18    Temp: 97.8 F (36.6 C)     TempSrc: Oral     SpO2: 100% 100% 100%   Weight:    103.1 kg  Height:    5\' 6"  (1.676 m)   Weight change:  No intake or output data in the 24 hours ending 04/10/20 1605   Exam: Heart:: Regular rate and rhythm, S1S2 present or without murmur or extra heart sounds Lungs: normal and clear to auscultation and percussion Abdomen: soft, nontender, normal bowel sounds   Lab Results: @LABTEST2 @ Micro Results: Recent Results (from the past 240 hour(s))  SARS Coronavirus 2 by RT PCR (hospital order, performed in [place] hospital lab) Nasopharyngeal Nasopharyngeal Swab     Status: None   Collection Time: 04/09/20 11:12 AM   Specimen: Nasopharyngeal Swab  Result Value Ref Range Status   SARS Coronavirus 2 NEGATIVE NEGATIVE Final    Comment: (NOTE) SARS-CoV-2 target nucleic acids are NOT DETECTED.  The SARS-CoV-2 RNA is generally detectable in upper and lower respiratory specimens during the acute phase of infection. The lowest concentration of SARS-CoV-2 viral copies this assay can detect is 250 copies / mL. A negative result does not preclude SARS-CoV-2 infection and should not be used as the sole basis for treatment or other patient management decisions.  A negative result may occur with improper specimen collection / handling, submission of specimen other than nasopharyngeal swab, presence of viral mutation(s)  within the areas targeted by this assay, and inadequate number of viral copies (<250 copies / mL). A negative result must be combined with clinical observations, patient history, and epidemiological information.  Fact Sheet for Patients:   StrictlyIdeas.no  Fact Sheet for Healthcare Providers: BankingDealers.co.za  This test is not yet approved or  cleared by the [place] FDA and has been authorized for detection and/or diagnosis of SARS-CoV-2 by FDA under an Emergency Use Authorization (EUA).  This EUA will remain in effect (meaning this test can be used) for the duration of the COVID-19 declaration under Section 564(b)(1) of the Act, 21 U.S.C. section 360bbb-3(b)(1), unless the authorization is terminated or revoked sooner.  Performed at [facility], [address], [place], [place] [address]    Studies/Results: [place] Chest 2 View  Result Date: 04/09/2020 CLINICAL DATA:  Shortness of breath. History of heart disease. Former smoker. EXAM: CHEST - 2 VIEW COMPARISON:  March 20, 2020 FINDINGS: The heart, hila, and mediastinum are normal. Coarsened interstitial lung markings remain but are improved. Mildly more focal opacities in the bases, left greater than right. No pneumothorax. No nodules or masses. No other abnormalities. IMPRESSION: 1. Coarsened lung markings have improved but persists consistent with bronchitic change versus atypical infection. Pulmonary venous congestion possible. 2. More focal opacities in the bases are mild and may simply represent atelectasis. Early infiltrates possible. Recommend attention on follow-up. Electronically Signed   By: Dorise Bullion III M.D  On: 04/09/2020 10:18   Medications: I have reviewed the patient's current medications. Scheduled Meds: . calcitRIOL  0.25 mcg Oral Daily  . carvedilol  12.5 mg Oral BID WC  . clopidogrel  75 mg Oral Daily  . insulin aspart  0-15 Units Subcutaneous  Q4H  . isosorbide mononitrate  30 mg Oral Daily  . pantoprazole (PROTONIX) IV  40 mg Intravenous Q12H  . rosuvastatin  10 mg Oral Daily  . torsemide  40 mg Oral Daily   Continuous Infusions: . sodium chloride 30 mL/hr at 04/09/20 1656   PRN Meds:.cyclobenzaprine, hydroxypropyl methylcellulose / hypromellose, ipratropium-albuterol, ondansetron **OR** ondansetron (ZOFRAN) IV   Assessment: Principal Problem:   Acute blood loss anemia Active Problems:   3-vessel CAD   CKD stage 4 due to type 2 diabetes mellitus (HCC)   Chronic combined systolic and diastolic CHF (congestive heart failure) (HCC)   CKD (chronic kidney disease), stage IV (HCC)   Depression   Aortic stenosis   Elevated troponin I level    Plan: This patient came in with melena and anemia.  The patient will be set up for an EGD for tomorrow to rule out peptic ulcer disease as the cause of his melena.  The patient has been explained the plan and agrees with it.   LOS: 1 day   Lewayne Bunting 04/10/2020, 4:05 PM Pager [phone] 7am-5pm  Check AMION for 5pm -7am coverage and on weekends

## 2020-04-10 NOTE — Progress Notes (Addendum)
PROGRESS NOTE    [name] [name]  KHT:[id number] DOB: 12/02/1965 DOA: 04/07/2020 PCP: Patient, No Pcp Per  Outpatient Specialists:     Brief Narrative:  [name] [name] is a 64 y.o. male with medical history significant for coronary artery disease status post three-vessel CABG, status post recent PCI with stent angioplasty, history of diabetes, stage IV chronic kidney disease and hypertension who presents to the ER for evaluation of chest pain that started in the early hours of the morning prior to his admission.  Patient states that chest pain started at rest was mostly over the left anterior chest wall and was nonradiating non radiating.  He rated his chest pain an 8 x 10 in intensity at its worst, worse with exertion and associated with nausea and diaphoresis but he denied having any vomiting, shortness of breath or palpitations.  Chest pain does not feel like his prior episodes of heart attack and it was not relieved by nitroglycerin. Patient states that he has had black stools for about 2 days and complains of feeling very weak but denies feeling dizzy or lightheaded.  He denies having any falls or loss of consciousness. He denies any NSAID use and denies having any hematemesis or hematochezia.  He has no abdominal pain. Labs show sodium 135, potassium 4.7, chloride 97, bicarb 22, glucose 252, BUN 94, creatinine 3.9, calcium 9.5, troponin 129 >> 223, white count 10.3, hemoglobin 6.5, hematocrit 19.7, MCV 97.5, RDW 16.5, platelet count 162 Chest x-ray reviewed by me shows pulmonary venous congestion Twelve-lead EKG reviewed by me shows sinus tachycardia with a left bundle branch block   ED Course: Patient is a 64 year old male with a history of diabetes mellitus with complications of stage IV chronic kidney disease, history of coronary artery disease status post CABG status post stent angioplasty who presents to the ER for evaluation of chest pain.  He is noted to have a drop in his  hemoglobin from 9.4g/dl about a week ago to 6.5g/dl.  He also gives a 2-day history of passage of melena stools.  Twelve-lead EKG does not show any acute findings.  Patient received 2 units of packed RBC in the ER.  Patient will be admitted to the hospital for further evaluation.   Assessment & Plan:   Principal Problem:   Colon wall thickening Active Problems:   URI (upper respiratory infection)   History of lung cancer   Hypoxia   # Acute blood loss anemia From a gastrointestinal source. Patient has had 2 days of passage of melena stools but denies having any abdominal pain. He is on aspirin and Plavix following recent stent angioplasty. Patient has been transfused 2 units of packed RBC in the ER. S/p 2 units. H 8 this morning - continue pantop IV bid - maintain H > 8 (AFTERNOON ADDENDUM - PM H 7.7, WILL TRANSFUSE 1 ADDITIONAL UNIT) - nursing to place 2nd IV - NPO for now, appreciate GI recs (AFTERNOON ADDENDUM - PLAN FOR EGD TOMORROW, WILL MAKE NPO AT MIDNIGHT) - aspirin held, plavix continued per cardiology   # Coronary artery disease Patient has a history of coronary artery disease and is status post three-vessel CABG as well as recent stent angioplasty for which he was placed on dual antiplatelet therapy.Patient presented for evaluation of chest pain and noted to have bumped his troponin from 129 >> 233. Likely demand from acute blood loss, chest pain has resolved. - monitor - will re-start coreg, will resume imdur assuming pressures remain  stable   # Chronic combined systolic and diastolic dysfunction CHF Last known LVEF of 30 to 35% - cont home torsemide 40 qd   # CKD4 - at (new) baseline, k wnl.  - appreciate nephrology recs -holdinghome losartan   #Diabetes mellitus with complications of stage IV chronic kidney disease Controlled - cont SSI   # History of aortic stenosis  Severe - monitor closely  # Depression Hold all antidepressants for now since  patient is n.p.o.  # OSA - qhs cpap   DVT prophylaxis: SCDs Code Status: full Family Communication: home The patient is from: home Anticipated d/c is to: home Anticipated d/c date is: 9/21 Patient currently is not medically stable to d/c due to: acute illness requiring inpatient evaluatioin and treatment  Consultants:   GI, cardiology, nephrology  Procedures:   none  Antimicrobials:   none    Subjective: Chest pain resolved. Had a non-watery dark BM this morning. No nausea or vomiting, no abd pain.   Objective: Vitals:   04/08/20 0430 04/08/20 0500 04/08/20 0530 04/08/20 0600  BP: 128/79 119/81 130/87 119/82  Pulse:   (!) 119 (!) 114  Resp: 19 18 (!) 21 19  Temp:      TempSrc:      SpO2:   93% 93%  Weight:      Height:        Intake/Output Summary (Last 24 hours) at 04/08/2020 1328 Last data filed at 04/08/2020 1000 Gross per 24 hour  Intake 360 ml  Output --  Net 360 ml   Filed Weights   04/07/20 0345 04/07/20 1132  Weight: 90.7 kg 90 kg    Examination:  General exam: Appears calm and comfortable  Respiratory system: Clear to auscultation. Respiratory effort normal. Cardiovascular system: harsh holosystolic murmur, rr Gastrointestinal system: Abdomen is obese, soft and nontender. No organomegaly or masses felt. Normal bowel sounds heard. Central nervous system: Alert and oriented. No focal neurological deficits. Extremities: Symmetric 5 x 5 power. 1+ pitting edema LEs Skin: No rashes, lesions or ulcers Psychiatry: Judgement and insight appear normal. Mood & affect appropriate.     Data Reviewed: I have personally reviewed following labs and imaging studies  CBC: Recent Labs  Lab 04/06/20 0908 04/07/20 0349 04/08/20 0420  WBC 6.5 8.3 7.7  NEUTROABS 4.9  --   --   HGB 14.9 15.0 14.0  HCT 44.5 43.4 41.4  MCV 90.4 88.6 92.0  PLT 181 201 801   Basic Metabolic Panel: Recent Labs  Lab 04/06/20 0908 04/07/20 0349 04/08/20 0420  NA 139  140 141  K 4.1 3.6 4.2  CL 101 104 103  CO2 28 25 27   GLUCOSE 109* 117* 107*  BUN 7 14 12   CREATININE 0.95 0.92 0.93  CALCIUM 8.7* 8.9 8.2*  MG 1.9  --   --    GFR: Estimated Creatinine Clearance: 93.2 mL/min (by C-G formula based on SCr of 0.93 mg/dL). Liver Function Tests: Recent Labs  Lab 04/06/20 0908 04/07/20 0349 04/08/20 0420  AST 19 24 22   ALT 17 21 19   ALKPHOS 78 74 66  BILITOT 1.0 0.8 0.9  PROT 7.4 7.5 7.1  ALBUMIN 4.3 4.1 3.9   Recent Labs  Lab 04/07/20 0349  LIPASE 24   No results for input(s): AMMONIA in the last 168 hours. Coagulation Profile: No results for input(s): INR, PROTIME in the last 168 hours. Cardiac Enzymes: No results for input(s): CKTOTAL, CKMB, CKMBINDEX, TROPONINI in the last 168 hours. BNP (last  3 results) No results for input(s): PROBNP in the last 8760 hours. HbA1C: No results for input(s): HGBA1C in the last 72 hours. CBG: No results for input(s): GLUCAP in the last 168 hours. Lipid Profile: No results for input(s): CHOL, HDL, LDLCALC, TRIG, CHOLHDL, LDLDIRECT in the last 72 hours. Thyroid Function Tests: No results for input(s): TSH, T4TOTAL, FREET4, T3FREE, THYROIDAB in the last 72 hours. Anemia Panel: No results for input(s): VITAMINB12, FOLATE, FERRITIN, TIBC, IRON, RETICCTPCT in the last 72 hours. Urine analysis:    Component Value Date/Time   COLORURINE YELLOW (A) 04/07/2020 0349   APPEARANCEUR HAZY (A) 04/07/2020 0349   APPEARANCEUR Clear 04/20/2015 1500   LABSPEC 1.029 04/07/2020 0349   PHURINE 5.0 04/07/2020 0349   GLUCOSEU NEGATIVE 04/07/2020 0349   HGBUR NEGATIVE 04/07/2020 0349   BILIRUBINUR NEGATIVE 04/07/2020 0349   BILIRUBINUR Negative 04/20/2015 1500   KETONESUR NEGATIVE 04/07/2020 0349   PROTEINUR 30 (A) 04/07/2020 0349   NITRITE NEGATIVE 04/07/2020 0349   LEUKOCYTESUR NEGATIVE 04/07/2020 0349   Sepsis Labs: @LABRCNTIP (procalcitonin:4,lacticidven:4)  ) Recent Results (from the past 240 hour(s))   SARS Coronavirus 2 by RT PCR (hospital order, performed in [place] hospital lab) Nasopharyngeal Nasopharyngeal Swab     Status: None   Collection Time: 04/06/20  8:43 AM   Specimen: Nasopharyngeal Swab  Result Value Ref Range Status   SARS Coronavirus 2 NEGATIVE NEGATIVE Final    Comment: (NOTE) SARS-CoV-2 target nucleic acids are NOT DETECTED.  The SARS-CoV-2 RNA is generally detectable in upper and lower respiratory specimens during the acute phase of infection. The lowest concentration of SARS-CoV-2 viral copies this assay can detect is 250 copies / mL. A negative result does not preclude SARS-CoV-2 infection and should not be used as the sole basis for treatment or other patient management decisions.  A negative result may occur with improper specimen collection / handling, submission of specimen other than nasopharyngeal swab, presence of viral mutation(s) within the areas targeted by this assay, and inadequate number of viral copies (<250 copies / mL). A negative result must be combined with clinical observations, patient history, and epidemiological information.  Fact Sheet for Patients:   StrictlyIdeas.no  Fact Sheet for Healthcare Providers: BankingDealers.co.za  This test is not yet approved or  cleared by the [place] FDA and has been authorized for detection and/or diagnosis of SARS-CoV-2 by FDA under an Emergency Use Authorization (EUA).  This EUA will remain in effect (meaning this test can be used) for the duration of the COVID-19 declaration under Section 564(b)(1) of the Act, 21 U.S.C. section 360bbb-3(b)(1), unless the authorization is terminated or revoked sooner.  Performed at [facility], [address] [place], [place] [address]   SARS Coronavirus 2 by RT PCR (hospital order, performed in [facility] hospital lab) Nasopharyngeal Nasopharyngeal Swab     Status: None   Collection Time:  04/07/20  4:09 PM   Specimen: Nasopharyngeal Swab  Result Value Ref Range Status   SARS Coronavirus 2 NEGATIVE NEGATIVE Final    Comment: (NOTE) SARS-CoV-2 target nucleic acids are NOT DETECTED.  The SARS-CoV-2 RNA is generally detectable in upper and lower respiratory specimens during the acute phase of infection. The lowest concentration of SARS-CoV-2 viral copies this assay can detect is 250 copies / mL. A negative result does not preclude SARS-CoV-2 infection and should not be used as the sole basis for treatment or other patient management decisions.  A negative result may occur with improper specimen collection / handling,  submission of specimen other than nasopharyngeal swab, presence of viral mutation(s) within the areas targeted by this assay, and inadequate number of viral copies (<250 copies / mL). A negative result must be combined with clinical observations, patient history, and epidemiological information.  Fact Sheet for Patients:   StrictlyIdeas.no  Fact Sheet for Healthcare Providers: BankingDealers.co.za  This test is not yet approved or  cleared by the [place] FDA and has been authorized for detection and/or diagnosis of SARS-CoV-2 by FDA under an Emergency Use Authorization (EUA).  This EUA will remain in effect (meaning this test can be used) for the duration of the COVID-19 declaration under Section 564(b)(1) of the Act, 21 U.S.C. section 360bbb-3(b)(1), unless the authorization is terminated or revoked sooner.  Performed at [facility], [address] [place], [place] [address]          Radiology Studies: DG Abd 1 View  Result Date: 04/08/2020 CLINICAL DATA:  : Obstruction EXAM: ABDOMEN - 1 VIEW COMPARISON:  04/07/2020 CT FINDINGS: Continued bowel dilatation most notable throughout the colon into the distal descending colon. Small bowel dilatation also noted. Findings could reflect  ileus or distal colonic obstruction. Contrast material seen within the bladder. No organomegaly or free air. IMPRESSION: Continued bowel distension, most notable within the colon to the level of the distal descending colon. This could reflect ileus or distal colonic obstruction. Electronically Signed   By: Rolm Baptise M.D.   On: 04/08/2020 11:25   CT CHEST ABDOMEN PELVIS W CONTRAST  Result Date: 04/07/2020 CLINICAL DATA:  Cough, acute generalized abdominal pain. EXAM: CT CHEST, ABDOMEN, AND PELVIS WITH CONTRAST TECHNIQUE: Multidetector CT imaging of the chest, abdomen and pelvis was performed following the standard protocol during bolus administration of intravenous contrast. CONTRAST:  155mL OMNIPAQUE IOHEXOL 300 MG/ML  SOLN COMPARISON:  Dec 08, 2019.  June 29, 2019. FINDINGS: CT CHEST FINDINGS Cardiovascular: Normal cardiac size. No pericardial effusion. No evidence of thoracic aortic dissection or aneurysm. Status post internal mammary bypass graft. Mediastinum/Nodes: No enlarged mediastinal, hilar, or axillary lymph nodes. Thyroid gland, trachea, and esophagus demonstrate no significant findings. Lungs/Pleura: No pneumothorax or pleural effusion is noted. Mild left basilar subsegmental atelectasis is noted due to severely elevated left hemidiaphragm, possibly due to diaphragmatic paralysis. Right lung is unremarkable. Musculoskeletal: No chest wall mass or suspicious bone lesions identified. CT ABDOMEN PELVIS FINDINGS Hepatobiliary: No focal liver abnormality is seen. No gallstones, gallbladder wall thickening, or biliary dilatation. Pancreas: Unremarkable. No pancreatic ductal dilatation or surrounding inflammatory changes. Spleen: Normal in size without focal abnormality. Adrenals/Urinary Tract: Adrenal glands are unremarkable. Kidneys are normal, without renal calculi, focal lesion, or hydronephrosis. Bladder is unremarkable. Stomach/Bowel: The stomach appears normal. No small bowel dilatation is  noted. The appendix is not visualized. Air-filled and dilated transverse and right colon is noted as well as some dilatation of distal descending colon. Stool is noted in the sigmoid colon and rectum. There is noted irregular wall thickening involving a portion of the splenic flexure and proximal descending colon which may represent scarring from prior inflammation, but colonoscopy is recommended to rule out possible neoplasm. Vascular/Lymphatic: Aortic atherosclerosis. No enlarged abdominal or pelvic lymph nodes. Reproductive: Stable mild prostatic enlargement is noted. Other: No abdominal wall hernia or abnormality. No abdominopelvic ascites. Musculoskeletal: No acute or significant osseous findings. IMPRESSION: 1. Air-filled and dilated transverse and right colon is noted as well as some dilatation of distal descending colon. There is noted irregular wall thickening involving a portion of the  splenic flexure and proximal descending colon which may represent scarring from prior inflammation, but colonoscopy is recommended to rule out possible neoplasm. 2. Stable mild prostatic enlargement. 3. Mild left basilar subsegmental atelectasis is noted due to severely elevated left hemidiaphragm, possibly due to diaphragmatic paralysis. 4. Aortic atherosclerosis. Aortic Atherosclerosis (ICD10-I70.0). Electronically Signed   By: Marijo Conception M.D.   On: 04/07/2020 14:00        Scheduled Meds: . albuterol  2.5 mg Nebulization Q6H  . predniSONE  40 mg Oral Q breakfast   Continuous Infusions: . sodium chloride       LOS: 1 day    Time spent: 30 min    Desma Maxim, MD [facility]  If 7PM-7AM, please contact night-coverage [email or web address] Password [email or web address] 04/08/2020, 1:28 PM

## 2020-04-10 NOTE — ED Notes (Signed)
Patient refusing second IV at this time

## 2020-04-10 NOTE — Progress Notes (Signed)
Pt bed alarm but was educated about safety. Pt blood transfusion of 340 mls. Pt tolerated well. Will continue to monitor.

## 2020-04-10 NOTE — ED Notes (Addendum)
Pt sitting in chair, reports wanting the monitor disconnected, small amount of ice chips given, POC discussed, pt denies pain  NS infusing at 80mls/hr

## 2020-04-10 NOTE — Progress Notes (Signed)
[facility]  ROUNDING NOTE   Subjective:   Refusing medications this morning. Complains of neuropathy.   Objective:  Vital signs in last 24 hours:  Temp:  [97.8 F (36.6 C)] 97.8 F (36.6 C) (09/19 0729) Pulse Rate:  [87-95] 88 (09/19 1141) Resp:  [14-20] 18 (09/19 1141) BP: (120-139)/(63-95) 138/88 (09/19 1141) SpO2:  [96 %-100 %] 100 % (09/19 1141)  Weight change:  Filed Weights   04/09/20 0913  Weight: 102.1 kg    Intake/Output: I/O last 3 completed shifts: In: 720 [I.V.:50; Blood:670] Out: -    Intake/Output this shift:  No intake/output data recorded.  Physical Exam: General: NAD,   Head: Normocephalic, atraumatic. Moist oral mucosal membranes  Eyes: Anicteric, PERRL  Neck: Supple, trachea midline  Lungs:  Clear to auscultation  Heart: Regular rate and rhythm  Abdomen:  Soft, nontender,   Extremities:  no peripheral edema.  Neurologic: Nonfocal, moving all four extremities  Skin: No lesions  Access: none    Basic Metabolic Panel: Recent Labs  Lab 04/09/20 0916 04/10/20 0501  NA 135 134*  K 4.7 4.3  CL 97* 96*  CO2 22 25  GLUCOSE 252* 189*  BUN 94* 100*  CREATININE 3.99* 4.03*  CALCIUM 8.5* 8.7*    Liver Function Tests: No results for input(s): AST, ALT, ALKPHOS, BILITOT, PROT, ALBUMIN in the last 168 hours. No results for input(s): LIPASE, AMYLASE in the last 168 hours. No results for input(s): AMMONIA in the last 168 hours.  CBC: Recent Labs  Lab 04/09/20 0916 04/09/20 1607 04/10/20 0501  WBC 10.3  --   --   HGB 6.5* 7.6* 8.0*  HCT 19.7* 23.1* 24.3*  MCV 97.5  --   --   PLT 162  --   --     Cardiac Enzymes: No results for input(s): CKTOTAL, CKMB, CKMBINDEX, TROPONINI in the last 168 hours.  BNP: Invalid input(s): POCBNP  CBG: Recent Labs  Lab 04/09/20 1606 04/09/20 1935 04/10/20 0449 04/10/20 0732 04/10/20 1136  GLUCAP 162* 147* 136* 160* 149*    Microbiology: Results for orders placed or performed  during the hospital encounter of 04/09/20  SARS Coronavirus 2 by RT PCR (hospital order, performed in [facility] hospital lab) Nasopharyngeal Nasopharyngeal Swab     Status: None   Collection Time: 04/09/20 11:12 AM   Specimen: Nasopharyngeal Swab  Result Value Ref Range Status   SARS Coronavirus 2 NEGATIVE NEGATIVE Final    Comment: (NOTE) SARS-CoV-2 target nucleic acids are NOT DETECTED.  The SARS-CoV-2 RNA is generally detectable in upper and lower respiratory specimens during the acute phase of infection. The lowest concentration of SARS-CoV-2 viral copies this assay can detect is 250 copies / mL. A negative result does not preclude SARS-CoV-2 infection and should not be used as the sole basis for treatment or other patient management decisions.  A negative result may occur with improper specimen collection / handling, submission of specimen other than nasopharyngeal swab, presence of viral mutation(s) within the areas targeted by this assay, and inadequate number of viral copies (<250 copies / mL). A negative result must be combined with clinical observations, patient history, and epidemiological information.  Fact Sheet for Patients:   StrictlyIdeas.no  Fact Sheet for Healthcare Providers: BankingDealers.co.za  This test is not yet approved or  cleared by the [place] FDA and has been authorized for detection and/or diagnosis of SARS-CoV-2 by FDA under an Emergency Use Authorization (EUA).  This EUA will remain in effect (meaning  this test can be used) for the duration of the COVID-19 declaration under Section 564(b)(1) of the Act, 21 U.S.C. section 360bbb-3(b)(1), unless the authorization is terminated or revoked sooner.  Performed at [facility], [address] [place], [place] [address]     Coagulation Studies: No results for input(s): LABPROT, INR in the last 72 hours.  Urinalysis: No results for  input(s): COLORURINE, LABSPEC, PHURINE, GLUCOSEU, HGBUR, BILIRUBINUR, KETONESUR, PROTEINUR, UROBILINOGEN, NITRITE, LEUKOCYTESUR in the last 72 hours.  Invalid input(s): APPERANCEUR    Imaging: DG Chest 2 View  Result Date: 04/09/2020 CLINICAL DATA:  Shortness of breath. History of heart disease. Former smoker. EXAM: CHEST - 2 VIEW COMPARISON:  March 20, 2020 FINDINGS: The heart, hila, and mediastinum are normal. Coarsened interstitial lung markings remain but are improved. Mildly more focal opacities in the bases, left greater than right. No pneumothorax. No nodules or masses. No other abnormalities. IMPRESSION: 1. Coarsened lung markings have improved but persists consistent with bronchitic change versus atypical infection. Pulmonary venous congestion possible. 2. More focal opacities in the bases are mild and may simply represent atelectasis. Early infiltrates possible. Recommend attention on follow-up. Electronically Signed   By: Dorise Bullion III M.D   On: 04/09/2020 10:18     Medications:   . sodium chloride 30 mL/hr at 04/09/20 1656   . carvedilol  12.5 mg Oral BID WC  . clopidogrel  75 mg Oral Daily  . insulin aspart  0-15 Units Subcutaneous Q4H  . isosorbide mononitrate  30 mg Oral Daily  . pantoprazole (PROTONIX) IV  40 mg Intravenous Q12H  . rosuvastatin  10 mg Oral Daily  . torsemide  40 mg Oral Daily   cyclobenzaprine, hydroxypropyl methylcellulose / hypromellose, ipratropium-albuterol, ondansetron **OR** ondansetron (ZOFRAN) IV  Assessment/ Plan:  [name] [name] is a 64 y.o. white male with peripheral vascular disease, diabetes mellitus type 2 insulin dependent, hypertension, major depressive disorder, tobacco use, psoriasis, diabetic neuropathy, splenectomy, carotid artery stenosis, coronary artery disease, hyperlipidemia, obstructive sleep apnea, history of DVT, CVA admitted to [facility] on 04/09/2020 for Acute blood loss anemia [D62]  1. Acute renal failure on chronic  kidney disease stage IV with proteinuria and hyperkalemia: Creatinine may be at his new baseline.  Recent acute renal failure from IV contrast nephropathy.  Now acute renal failure from acute blood loss anemia.  Chronic kidney disease secondary to diabetic nephropathy, NSAID induced nephropathy and hypertensive nephrosclerosis. Potassium at goal.  No acute indication for dialysis.  - holding losartan  2. Hypertension: Home regimen of torsemide, carvedilol, tamsulosin   3. Diabetes Mellitus type II Insulin Dependent with chronic kidney disease: insulin dependent. hemoglobin A1c 5.7% on 02/13/20  4. Secondary Hyperparathyroidism: PTH elevated at 133. Calcium and phosphorus at goal.  Not currently on a phosphorus binder.  - Continue calcitriol  5. Anemia with chronic kidney disease: and now with GI bleed Status post PRBC transfusion on admission - Appreciate cardiology and gastroenterology input.    LOS: 1 Tyshae Stair 9/19/20211:14 PM

## 2020-04-10 NOTE — Progress Notes (Signed)
Progress Note  Patient Name: [name] [name] Date of Encounter: 04/10/2020  [facility] Cardiologist: [name], MD   Subjective   Patient seen this morning, denies chest pain or shortness of breath, feels okay since receiving transfusion.  Had a bowel movement earlier which was dark.  Received 2 units of packed red blood cells, hemoglobin this morning improved to 8.  Inpatient Medications    Scheduled Meds: . carvedilol  12.5 mg Oral BID WC  . clopidogrel  75 mg Oral Daily  . insulin aspart  0-15 Units Subcutaneous Q4H  . pantoprazole (PROTONIX) IV  40 mg Intravenous Q12H  . rosuvastatin  10 mg Oral Daily  . torsemide  40 mg Oral Daily   Continuous Infusions: . sodium chloride 30 mL/hr at 04/09/20 1656   PRN Meds: cyclobenzaprine, hydroxypropyl methylcellulose / hypromellose, ipratropium-albuterol, ondansetron **OR** ondansetron (ZOFRAN) IV   Vital Signs    Vitals:   04/09/20 2230 04/10/20 0420 04/10/20 0729 04/10/20 1141  BP: 137/63 137/67 139/73 138/88  Pulse: 95 87 90 88  Resp: 14 17 18 18   Temp:   97.8 F (36.6 C)   TempSrc:   Oral   SpO2: 100% 97% 100% 100%  Weight:      Height:        Intake/Output Summary (Last 24 hours) at 04/10/2020 1253 Last data filed at 04/09/2020 1419 Gross per 24 hour  Intake 410 ml  Output --  Net 410 ml   Last 3 Weights 04/09/2020 04/07/2020 03/29/2020  Weight (lbs) 225 lb 234 lb 8 oz 237 lb  Weight (kg) 102.059 kg 106.369 kg 107.502 kg  Some encounter information is confidential and restricted. Go to Review Flowsheets activity to see all data.      Telemetry    Currently off telemetry- Personally Reviewed  ECG    No new ECG obtained- Personally Reviewed  Physical Exam   GEN: No acute distress.   Neck: No JVD Cardiac: RRR, 3/6 systolic murmur  Respiratory: Clear anteriorly, decreased breath sounds at bases GI: Soft, nontender, non-distended  MS: No edema; No deformity. Neuro:  Nonfocal  Psych: Normal  affect   Labs    High Sensitivity Troponin:   Recent Labs  Lab 03/20/20 1255 04/09/20 0916 04/09/20 1115 04/09/20 1428 04/09/20 1607  TROPONINIHS 86* 129* 223* 402* 604*      Chemistry Recent Labs  Lab 04/09/20 0916 04/10/20 0501  NA 135 134*  K 4.7 4.3  CL 97* 96*  CO2 22 25  GLUCOSE 252* 189*  BUN 94* 100*  CREATININE 3.99* 4.03*  CALCIUM 8.5* 8.7*  GFRNONAA 15* 14*  GFRAA 17* 17*  ANIONGAP 16* 13     Hematology Recent Labs  Lab 04/09/20 0916 04/09/20 1607 04/10/20 0501  WBC 10.3  --   --   RBC 2.02*  --   --   HGB 6.5* 7.6* 8.0*  HCT 19.7* 23.1* 24.3*  MCV 97.5  --   --   MCH 32.2  --   --   MCHC 33.0  --   --   RDW 16.5*  --   --   PLT 162  --   --     BNPNo results for input(s): BNP, PROBNP in the last 168 hours.   DDimer No results for input(s): DDIMER in the last 168 hours.   Radiology    DG Chest 2 View  Result Date: 04/09/2020 CLINICAL DATA:  Shortness of breath. History of heart disease. Former smoker. EXAM:  CHEST - 2 VIEW COMPARISON:  March 20, 2020 FINDINGS: The heart, hila, and mediastinum are normal. Coarsened interstitial lung markings remain but are improved. Mildly more focal opacities in the bases, left greater than right. No pneumothorax. No nodules or masses. No other abnormalities. IMPRESSION: 1. Coarsened lung markings have improved but persists consistent with bronchitic change versus atypical infection. Pulmonary venous congestion possible. 2. More focal opacities in the bases are mild and may simply represent atelectasis. Early infiltrates possible. Recommend attention on follow-up. Electronically Signed   By: Dorise Bullion III M.D   On: 04/09/2020 10:18    Cardiac Studies   Echo 02/2020 1. Left ventricular ejection fraction, by estimation, is 30-35%. The left  ventricle has moderately decreased function. The left ventricle  demonstrates regional wall motion abnormalities, basal to mid inferior and  inferoseptal akinesis,  basal to mid  inferolateral severe hypokinesis. The left ventricular internal cavity  size was mildly dilated. There is mild left ventricular hypertrophy. Left  ventricular diastolic parameters are consistent with Grade II diastolic  dysfunction (pseudonormalization).  2. Right ventricular systolic function is moderately reduced. The right  ventricular size is normal. There is mildly elevated pulmonary artery  systolic pressure. The estimated right ventricular systolic pressure is  50.5 mmHg.  3. Left atrial size was moderately dilated.  4. The mitral valve is degenerative. Moderate to severe mitral valve  regurgitation. No evidence of mitral stenosis.  5. The aortic valve is tricuspid. Aortic valve regurgitation is mild.  Moderate aortic valve stenosis. Aortic valve area, by VTI measures 1.08  cm. Aortic valve mean gradient measures 22.0 mmHg.  6. Aortic dilatation noted. There is mild dilatation of the ascending  aorta measuring 42 mm.  7. The inferior vena cava is dilated in size with >50% respiratory  variability, suggesting right atrial pressure of 8 mmHg.    Patient Profile     64 y.o. male with history of CAD s/p CABG 2012 recent PCI/ DES to RCA on 03/23/2020, mod to severe AS, CKD-IV,  ICM EF 30-35%, HTN, HLD who presents due to chest pain in the setting of GI bleed and anemia.  Assessment & Plan    1.  Chest pain, anemia, minimally elevated troponins -Patient is asymptomatic after blood transfusion -Troponin elevation likely demand supply mismatch 2/2 anemia, underlying CAD. -No plan for ischemic work-up at this point  2. CAD/CABG, recent PCI/DES to RCA on 9/1 -Challenging case with recent PCI and anemia with risk for stent thrombosis. -Hemoglobin 8 this morning.  Continue to hold aspirin, continue Plavix. -Monitor bleeding/H&H  -Keep hemoglobin>8 -Continue Protonix 40 mg twice daily IV -statin, imdur  3. ICM EF 30-35% -Continue Coreg, will restart  Imdur. -Can add hydralazine when BP permits. - torsemide 40 mg daily  4. Mod to severe AS -torsemide -outpatient TAVR consideration with structural team  5. GI bleed -protonix -hold asa -Okay for endoscopy to be performed by GI as appropriate from a cardiac perspective.  If possible to perform procedure while patient is on on Plavix that would be ideal.      Signed, Kate Sable, MD  04/10/2020, 12:53 PM

## 2020-04-10 NOTE — ED Notes (Signed)
Ice chips and reading materials provided, pt reports unable to sleep, and anxious about procedure

## 2020-04-10 NOTE — ED Notes (Signed)
Patient refuses to let staff keep him on cardiac monitoring, blood pressure cuff, or pulse ox

## 2020-04-10 NOTE — ED Notes (Signed)
Pt c/o of discomfort at IV site, patency confirmed, fluids paused and disconnected for pt comfort

## 2020-04-11 ENCOUNTER — Telehealth: Payer: Self-pay

## 2020-04-11 ENCOUNTER — Other Ambulatory Visit: Payer: Self-pay

## 2020-04-11 ENCOUNTER — Encounter
Admission: EM | Disposition: A | Discharge status: Home / Self Care | Payer: Self-pay | Source: Home / Self Care | Attending: Obstetrics and Gynecology

## 2020-04-11 ENCOUNTER — Inpatient Hospital Stay: Payer: Medicare Other | Admitting: Anesthesiology

## 2020-04-11 ENCOUNTER — Encounter: Payer: Self-pay | Admitting: Internal Medicine

## 2020-04-11 DIAGNOSIS — I248 Other forms of acute ischemic heart disease: Secondary | ICD-10-CM

## 2020-04-11 HISTORY — PX: ESOPHAGOGASTRODUODENOSCOPY (EGD) WITH PROPOFOL: SHX5813

## 2020-04-11 LAB — BASIC METABOLIC PANEL
Anion gap: 14 (ref 5–15)
BUN: 95 mg/dL — ABNORMAL HIGH (ref 8–23)
CO2: 24 mmol/L (ref 22–32)
Calcium: 8.3 mg/dL — ABNORMAL LOW (ref 8.9–10.3)
Chloride: 98 mmol/L (ref 98–111)
Creatinine, Ser: 3.87 mg/dL — ABNORMAL HIGH (ref 0.61–1.24)
GFR calc Af Amer: 18 mL/min — ABNORMAL LOW (ref >60)
GFR calc non Af Amer: 15 mL/min — ABNORMAL LOW (ref >60)
Glucose, Bld: 138 mg/dL — ABNORMAL HIGH (ref 70–99)
Potassium: 3.8 mmol/L (ref 3.5–5.1)
Sodium: 136 mmol/L (ref 135–145)

## 2020-04-11 LAB — GLUCOSE, CAPILLARY
Glucose-Capillary: 109 mg/dL — ABNORMAL HIGH (ref 70–99)
Glucose-Capillary: 110 mg/dL — ABNORMAL HIGH (ref 70–99)
Glucose-Capillary: 115 mg/dL — ABNORMAL HIGH (ref 70–99)
Glucose-Capillary: 121 mg/dL — ABNORMAL HIGH (ref 70–99)
Glucose-Capillary: 148 mg/dL — ABNORMAL HIGH (ref 70–99)
Glucose-Capillary: 202 mg/dL — ABNORMAL HIGH (ref 70–99)
Glucose-Capillary: 97 mg/dL (ref 70–99)

## 2020-04-11 LAB — CBC
HCT: 25.4 % — ABNORMAL LOW (ref 39.0–52.0)
Hemoglobin: 8.7 g/dL — ABNORMAL LOW (ref 13.0–17.0)
MCH: 30.4 pg (ref 26.0–34.0)
MCHC: 34.3 g/dL (ref 30.0–36.0)
MCV: 88.8 fL (ref 80.0–100.0)
Platelets: 163 10*3/uL (ref 150–400)
RBC: 2.86 MIL/uL — ABNORMAL LOW (ref 4.22–5.81)
RDW: 19.2 % — ABNORMAL HIGH (ref 11.5–15.5)
WBC: 7 10*3/uL (ref 4.0–10.5)
nRBC: 0 % (ref 0.0–0.2)

## 2020-04-11 LAB — TROPONIN I (HIGH SENSITIVITY): Troponin I (High Sensitivity): 1471 ng/L (ref <18)

## 2020-04-11 SURGERY — ESOPHAGOGASTRODUODENOSCOPY (EGD) WITH PROPOFOL
Anesthesia: Monitor Anesthesia Care

## 2020-04-11 MED ORDER — FENTANYL CITRATE (PF) 100 MCG/2ML IJ SOLN
INTRAMUSCULAR | Status: DC | PRN
Start: 2020-04-11 — End: 2020-04-11
  Administered 2020-04-11 (×2): 25 ug via INTRAVENOUS
  Administered 2020-04-11: 50 ug via INTRAVENOUS

## 2020-04-11 MED ORDER — SODIUM CHLORIDE 0.9 % IV SOLN: Freq: Once | INTRAVENOUS | Status: AC

## 2020-04-11 MED ORDER — TAMSULOSIN HCL 0.4 MG PO CAPS
0.4000 mg | ORAL_CAPSULE | Freq: Every day | ORAL | Status: DC
  Administered 2020-04-12: 0.4 mg via ORAL
  Filled 2020-04-11: qty 1

## 2020-04-11 MED ORDER — LIDOCAINE HCL (PF) 4 % IJ SOLN
INTRAMUSCULAR | Status: DC | PRN
  Administered 2020-04-11: 160 mg

## 2020-04-11 MED ORDER — MIDAZOLAM HCL 2 MG/2ML IJ SOLN
INTRAMUSCULAR | Status: DC | PRN
  Administered 2020-04-11 (×2): 1 mg via INTRAVENOUS

## 2020-04-11 MED ORDER — FINASTERIDE 5 MG PO TABS
5.0000 mg | ORAL_TABLET | Freq: Every day | ORAL | Status: DC
  Administered 2020-04-12: 5 mg via ORAL
  Filled 2020-04-11: qty 1

## 2020-04-11 NOTE — Transfer of Care (Signed)
Immediate Anesthesia Transfer of Care Note  Patient: [name] [name]  Procedure(s) Performed: ESOPHAGOGASTRODUODENOSCOPY (EGD) WITH PROPOFOL (N/A )  Patient Location: PACU  Anesthesia Type:MAC  Level of Consciousness: awake, alert  and oriented  Airway & Oxygen Therapy: Patient Spontanous Breathing and Patient connected to nasal cannula oxygen  Post-op Assessment: Report given to RN and Post -op Vital signs reviewed and stable  Post vital signs:    Last Vitals:  Vitals Value Taken Time  BP 142/68 04/11/20 1348  Temp 36.3 C 04/11/20 1348  Pulse 88 04/11/20 1350  Resp 17 04/11/20 1350  SpO2 95 % 04/11/20 1350  Vitals shown include unvalidated device data.  Last Pain:  Vitals:   04/11/20 1348  TempSrc: Tympanic  PainSc: Asleep         Complications: No complications documented.

## 2020-04-11 NOTE — Progress Notes (Addendum)
Verbal ok from Dr. Allen Norris to resume pt back on clear liquid diet.   Update 1530: Verbal ok to put patient on heart healthy diet. Patient had been tolerating clear liquids well without any nausea or vomiting and Dr. Allen Norris made aware.

## 2020-04-11 NOTE — Progress Notes (Signed)
PROGRESS NOTE    [name] [name]  VQM:[id number] DOB: 12/02/1965 DOA: 04/07/2020 PCP: Patient, No Pcp Per  Outpatient Specialists:     Brief Narrative:  [name] [name] is a 64 y.o. male with medical history significant for coronary artery disease status post three-vessel CABG, status post recent PCI with stent angioplasty, history of diabetes, stage IV chronic kidney disease and hypertension who presents to the ER for evaluation of chest pain that started in the early hours of the morning prior to his admission.  Patient states that chest pain started at rest was mostly over the left anterior chest wall and was nonradiating non radiating.  He rated his chest pain an 8 x 10 in intensity at its worst, worse with exertion and associated with nausea and diaphoresis but he denied having any vomiting, shortness of breath or palpitations.  Chest pain does not feel like his prior episodes of heart attack and it was not relieved by nitroglycerin. Patient states that he has had black stools for about 2 days and complains of feeling very weak but denies feeling dizzy or lightheaded.  He denies having any falls or loss of consciousness. He denies any NSAID use and denies having any hematemesis or hematochezia.  He has no abdominal pain. Labs show sodium 135, potassium 4.7, chloride 97, bicarb 22, glucose 252, BUN 94, creatinine 3.9, calcium 9.5, troponin 129 >> 223, white count 10.3, hemoglobin 6.5, hematocrit 19.7, MCV 97.5, RDW 16.5, platelet count 162 Chest x-ray reviewed by me shows pulmonary venous congestion Twelve-lead EKG reviewed by me shows sinus tachycardia with a left bundle branch block   ED Course: Patient is a 64 year old male with a history of diabetes mellitus with complications of stage IV chronic kidney disease, history of coronary artery disease status post CABG status post stent angioplasty who presents to the ER for evaluation of chest pain.  He is noted to have a drop in his  hemoglobin from 9.4g/dl about a week ago to 6.5g/dl.  He also gives a 2-day history of passage of melena stools.  Twelve-lead EKG does not show any acute findings.  Patient received 2 units of packed RBC in the ER.  Patient will be admitted to the hospital for further evaluation.   Assessment & Plan:   Principal Problem:   Colon wall thickening Active Problems:   URI (upper respiratory infection)   History of lung cancer   Hypoxia   # Acute blood loss anemia From a gastrointestinal source. Patient presented with 2 days of passage of melena stools but denies having any abdominal pain. He is on aspirin and Plavix following recent stent angioplasty. Patient has been transfused 2 units of packed RBC in the ER and another unit on 9/19 - continue pantop IV bid - maintain H > 8  - EGD this afternoon - aspirin held, plavix continued per cardiology  # Coronary artery disease Patient has a history of coronary artery disease and is status post three-vessel CABG as well as recent stent angioplasty for which he was placed on dual antiplatelet therapy.Patient presented for evaluation of chest pain and noted to have bumped his troponin from 129 >> 233. Likely demand from acute blood loss, chest pain has resolved. - monitor - will re-start coreg, will resume imdur assuming pressures remain stable - f/u cardiology recs when available  # Chronic combined systolic and diastolic dysfunction CHF Last known LVEF of 30 to 35%. Euvolemic on exam. - cont home torsemide 40 qd  #  CKD4 - at (likely new) baseline, k wnl.  - appreciate nephrology recs - holding home losartan  #Diabetes mellitus with complications of stage IV chronic kidney disease Controlled - cont SSI  # History of aortic stenosis  Severe. No signs decompensation. - monitor closely  # Depression - resume home meds after EGD  # OSA - qhs cpap   DVT prophylaxis: SCDs Code Status: full Family Communication: home The  patient is from: home Anticipated d/c is to: home Anticipated d/c date is: 9/21 Patient currently is not medically stable to d/c due to: acute illness requiring inpatient evaluatioin and treatment  Consultants:   GI, cardiology, nephrology  Procedures:   none  Antimicrobials:   none    Subjective: Chest pain resolved. No BMs overnight. No abd pain or emesis. Hungry.   Objective: Vitals:   04/08/20 0430 04/08/20 0500 04/08/20 0530 04/08/20 0600  BP: 128/79 119/81 130/87 119/82  Pulse:   (!) 119 (!) 114  Resp: 19 18 (!) 21 19  Temp:      TempSrc:      SpO2:   93% 93%  Weight:      Height:        Intake/Output Summary (Last 24 hours) at 04/08/2020 1328 Last data filed at 04/08/2020 1000 Gross per 24 hour  Intake 360 ml  Output --  Net 360 ml   Filed Weights   04/07/20 0345 04/07/20 1132  Weight: 90.7 kg 90 kg    Examination:  General exam: Appears calm and comfortable  Respiratory system: Clear to auscultation. Respiratory effort normal. Cardiovascular system: harsh holosystolic murmur, rr Gastrointestinal system: Abdomen is obese, soft and nontender. No organomegaly or masses felt. Normal bowel sounds heard. Central nervous system: Alert and oriented. No focal neurological deficits. Extremities: Symmetric 5 x 5 power. 1+ pitting edema LEs Skin: No rashes, lesions or ulcers Psychiatry: Judgement and insight appear normal. Mood & affect appropriate.     Data Reviewed: I have personally reviewed following labs and imaging studies  CBC: Recent Labs  Lab 04/06/20 0908 04/07/20 0349 04/08/20 0420  WBC 6.5 8.3 7.7  NEUTROABS 4.9  --   --   HGB 14.9 15.0 14.0  HCT 44.5 43.4 41.4  MCV 90.4 88.6 92.0  PLT 181 201 824   Basic Metabolic Panel: Recent Labs  Lab 04/06/20 0908 04/07/20 0349 04/08/20 0420  NA 139 140 141  K 4.1 3.6 4.2  CL 101 104 103  CO2 28 25 27   GLUCOSE 109* 117* 107*  BUN 7 14 12   CREATININE 0.95 0.92 0.93  CALCIUM 8.7* 8.9 8.2*   MG 1.9  --   --    GFR: Estimated Creatinine Clearance: 93.2 mL/min (by C-G formula based on SCr of 0.93 mg/dL). Liver Function Tests: Recent Labs  Lab 04/06/20 0908 04/07/20 0349 04/08/20 0420  AST 19 24 22   ALT 17 21 19   ALKPHOS 78 74 66  BILITOT 1.0 0.8 0.9  PROT 7.4 7.5 7.1  ALBUMIN 4.3 4.1 3.9   Recent Labs  Lab 04/07/20 0349  LIPASE 24   No results for input(s): AMMONIA in the last 168 hours. Coagulation Profile: No results for input(s): INR, PROTIME in the last 168 hours. Cardiac Enzymes: No results for input(s): CKTOTAL, CKMB, CKMBINDEX, TROPONINI in the last 168 hours. BNP (last 3 results) No results for input(s): PROBNP in the last 8760 hours. HbA1C: No results for input(s): HGBA1C in the last 72 hours. CBG: No results for input(s): GLUCAP in the  last 168 hours. Lipid Profile: No results for input(s): CHOL, HDL, LDLCALC, TRIG, CHOLHDL, LDLDIRECT in the last 72 hours. Thyroid Function Tests: No results for input(s): TSH, T4TOTAL, FREET4, T3FREE, THYROIDAB in the last 72 hours. Anemia Panel: No results for input(s): VITAMINB12, FOLATE, FERRITIN, TIBC, IRON, RETICCTPCT in the last 72 hours. Urine analysis:    Component Value Date/Time   COLORURINE YELLOW (A) 04/07/2020 0349   APPEARANCEUR HAZY (A) 04/07/2020 0349   APPEARANCEUR Clear 04/20/2015 1500   LABSPEC 1.029 04/07/2020 0349   PHURINE 5.0 04/07/2020 0349   GLUCOSEU NEGATIVE 04/07/2020 0349   HGBUR NEGATIVE 04/07/2020 0349   BILIRUBINUR NEGATIVE 04/07/2020 0349   BILIRUBINUR Negative 04/20/2015 1500   KETONESUR NEGATIVE 04/07/2020 0349   PROTEINUR 30 (A) 04/07/2020 0349   NITRITE NEGATIVE 04/07/2020 0349   LEUKOCYTESUR NEGATIVE 04/07/2020 0349   Sepsis Labs: @LABRCNTIP (procalcitonin:4,lacticidven:4)  ) Recent Results (from the past 240 hour(s))  SARS Coronavirus 2 by RT PCR (hospital order, performed in [place] hospital lab) Nasopharyngeal Nasopharyngeal Swab     Status: None    Collection Time: 04/06/20  8:43 AM   Specimen: Nasopharyngeal Swab  Result Value Ref Range Status   SARS Coronavirus 2 NEGATIVE NEGATIVE Final    Comment: (NOTE) SARS-CoV-2 target nucleic acids are NOT DETECTED.  The SARS-CoV-2 RNA is generally detectable in upper and lower respiratory specimens during the acute phase of infection. The lowest concentration of SARS-CoV-2 viral copies this assay can detect is 250 copies / mL. A negative result does not preclude SARS-CoV-2 infection and should not be used as the sole basis for treatment or other patient management decisions.  A negative result may occur with improper specimen collection / handling, submission of specimen other than nasopharyngeal swab, presence of viral mutation(s) within the areas targeted by this assay, and inadequate number of viral copies (<250 copies / mL). A negative result must be combined with clinical observations, patient history, and epidemiological information.  Fact Sheet for Patients:   StrictlyIdeas.no  Fact Sheet for Healthcare Providers: BankingDealers.co.za  This test is not yet approved or  cleared by the [place] FDA and has been authorized for detection and/or diagnosis of SARS-CoV-2 by FDA under an Emergency Use Authorization (EUA).  This EUA will remain in effect (meaning this test can be used) for the duration of the COVID-19 declaration under Section 564(b)(1) of the Act, 21 U.S.C. section 360bbb-3(b)(1), unless the authorization is terminated or revoked sooner.  Performed at [facility], [address] [place], [place] [address]   SARS Coronavirus 2 by RT PCR (hospital order, performed in [facility] hospital lab) Nasopharyngeal Nasopharyngeal Swab     Status: None   Collection Time: 04/07/20  4:09 PM   Specimen: Nasopharyngeal Swab  Result Value Ref Range Status   SARS Coronavirus 2 NEGATIVE NEGATIVE Final    Comment:  (NOTE) SARS-CoV-2 target nucleic acids are NOT DETECTED.  The SARS-CoV-2 RNA is generally detectable in upper and lower respiratory specimens during the acute phase of infection. The lowest concentration of SARS-CoV-2 viral copies this assay can detect is 250 copies / mL. A negative result does not preclude SARS-CoV-2 infection and should not be used as the sole basis for treatment or other patient management decisions.  A negative result may occur with improper specimen collection / handling, submission of specimen other than nasopharyngeal swab, presence of viral mutation(s) within the areas targeted by this assay, and inadequate number of viral copies (<250 copies / mL). A negative result  must be combined with clinical observations, patient history, and epidemiological information.  Fact Sheet for Patients:   StrictlyIdeas.no  Fact Sheet for Healthcare Providers: BankingDealers.co.za  This test is not yet approved or  cleared by the [place] FDA and has been authorized for detection and/or diagnosis of SARS-CoV-2 by FDA under an Emergency Use Authorization (EUA).  This EUA will remain in effect (meaning this test can be used) for the duration of the COVID-19 declaration under Section 564(b)(1) of the Act, 21 U.S.C. section 360bbb-3(b)(1), unless the authorization is terminated or revoked sooner.  Performed at [facility], [address], [place], [place] [address]          Radiology Studies: DG Abd 1 View  Result Date: 04/08/2020 CLINICAL DATA:  : Obstruction EXAM: ABDOMEN - 1 VIEW COMPARISON:  04/07/2020 CT FINDINGS: Continued bowel dilatation most notable throughout the colon into the distal descending colon. Small bowel dilatation also noted. Findings could reflect ileus or distal colonic obstruction. Contrast material seen within the bladder. No organomegaly or free air. IMPRESSION: Continued bowel  distension, most notable within the colon to the level of the distal descending colon. This could reflect ileus or distal colonic obstruction. Electronically Signed   By: Rolm Baptise M.D.   On: 04/08/2020 11:25   CT CHEST ABDOMEN PELVIS W CONTRAST  Result Date: 04/07/2020 CLINICAL DATA:  Cough, acute generalized abdominal pain. EXAM: CT CHEST, ABDOMEN, AND PELVIS WITH CONTRAST TECHNIQUE: Multidetector CT imaging of the chest, abdomen and pelvis was performed following the standard protocol during bolus administration of intravenous contrast. CONTRAST:  186mL OMNIPAQUE IOHEXOL 300 MG/ML  SOLN COMPARISON:  Dec 08, 2019.  June 29, 2019. FINDINGS: CT CHEST FINDINGS Cardiovascular: Normal cardiac size. No pericardial effusion. No evidence of thoracic aortic dissection or aneurysm. Status post internal mammary bypass graft. Mediastinum/Nodes: No enlarged mediastinal, hilar, or axillary lymph nodes. Thyroid gland, trachea, and esophagus demonstrate no significant findings. Lungs/Pleura: No pneumothorax or pleural effusion is noted. Mild left basilar subsegmental atelectasis is noted due to severely elevated left hemidiaphragm, possibly due to diaphragmatic paralysis. Right lung is unremarkable. Musculoskeletal: No chest wall mass or suspicious bone lesions identified. CT ABDOMEN PELVIS FINDINGS Hepatobiliary: No focal liver abnormality is seen. No gallstones, gallbladder wall thickening, or biliary dilatation. Pancreas: Unremarkable. No pancreatic ductal dilatation or surrounding inflammatory changes. Spleen: Normal in size without focal abnormality. Adrenals/Urinary Tract: Adrenal glands are unremarkable. Kidneys are normal, without renal calculi, focal lesion, or hydronephrosis. Bladder is unremarkable. Stomach/Bowel: The stomach appears normal. No small bowel dilatation is noted. The appendix is not visualized. Air-filled and dilated transverse and right colon is noted as well as some dilatation of distal  descending colon. Stool is noted in the sigmoid colon and rectum. There is noted irregular wall thickening involving a portion of the splenic flexure and proximal descending colon which may represent scarring from prior inflammation, but colonoscopy is recommended to rule out possible neoplasm. Vascular/Lymphatic: Aortic atherosclerosis. No enlarged abdominal or pelvic lymph nodes. Reproductive: Stable mild prostatic enlargement is noted. Other: No abdominal wall hernia or abnormality. No abdominopelvic ascites. Musculoskeletal: No acute or significant osseous findings. IMPRESSION: 1. Air-filled and dilated transverse and right colon is noted as well as some dilatation of distal descending colon. There is noted irregular wall thickening involving a portion of the splenic flexure and proximal descending colon which may represent scarring from prior inflammation, but colonoscopy is recommended to rule out possible neoplasm. 2. Stable mild prostatic enlargement. 3. Mild left basilar  subsegmental atelectasis is noted due to severely elevated left hemidiaphragm, possibly due to diaphragmatic paralysis. 4. Aortic atherosclerosis. Aortic Atherosclerosis (ICD10-I70.0). Electronically Signed   By: Marijo Conception M.D.   On: 04/07/2020 14:00        Scheduled Meds: . albuterol  2.5 mg Nebulization Q6H  . predniSONE  40 mg Oral Q breakfast   Continuous Infusions: . sodium chloride       LOS: 1 day    Time spent: 30 min    Desma Maxim, MD [facility]  If 7PM-7AM, please contact night-coverage [email or web address] Password [email or web address] 04/08/2020, 1:28 PM

## 2020-04-11 NOTE — Progress Notes (Signed)
Patient refusing bed alarm despite education on the importance of it. Patient agreeing to call out before getting up. Will continue to monitor.

## 2020-04-11 NOTE — Op Note (Signed)
[facility] Patient Name: [name] [name] Procedure Date: 04/11/2020 1:12 PM MRN: [id number] Account #: [id number] Date of Birth: 1955-08-22 Admit Type: Inpatient Age: 64 Room: [facility] ENDO ROOM 4 Gender: Male Note Status: Finalized Procedure:             Upper GI endoscopy Indications:           Melena Providers:             Lucilla Lame MD, MD Referring MD:          [name] (Referring MD) Medicines:             Propofol per Anesthesia Complications:         No immediate complications. Procedure:             Pre-Anesthesia Assessment:                        - Prior to the procedure, a History and Physical was                         performed, and patient medications and allergies were                         reviewed. The patient's tolerance of previous                         anesthesia was also reviewed. The risks and benefits                         of the procedure and the sedation options and risks                         were discussed with the patient. All questions were                         answered, and informed consent was obtained. Prior                         Anticoagulants: The patient has taken Plavix                         (clopidogrel), last dose was 3 days prior to                         procedure. ASA Grade Assessment: III - A patient with                         severe systemic disease. After reviewing the risks and                         benefits, the patient was deemed in satisfactory                         condition to undergo the procedure.                        After obtaining informed consent, the endoscope was  passed under direct vision. Throughout the procedure,                         the patient's blood pressure, pulse, and oxygen                         saturations were monitored continuously. The Endoscope                         was introduced through the mouth, and advanced to  the                         second part of duodenum. The upper GI endoscopy was                         accomplished without difficulty. The patient tolerated                         the procedure well. Findings:      A small hiatal hernia was present.      A non-bleeding diverticulum with a large opening and no stigmata of       recent bleeding was found in the middle third of the esophagus.      A single medium-sized bleb was found in the middle third of the       esophagus.      The stomach was normal.      The examined duodenum was normal. Impression:            - Small hiatal hernia.                        - Diverticulum in the middle third of the esophagus.                        - Bleb found in the esophagus.                        - Normal stomach.                        - Normal examined duodenum.                        - No specimens collected. Recommendation:        - Discharge patient to home.                        - Resume previous diet.                        - Continue present medications. Procedure Code(s):     --- Professional ---                        [phone], Esophagogastroduodenoscopy, flexible,                         transoral; diagnostic, including collection of                         specimen(s) by brushing or washing, when performed                         (  separate procedure) Diagnosis Code(s):     --- Professional ---                        K92.1, Melena (includes Hematochezia)                        K22.8, Other specified diseases of esophagus CPT copyright 2019 American Medical Association. All rights reserved. The codes documented in this report are preliminary and upon coder review may  be revised to meet current compliance requirements. Lucilla Lame MD, MD 04/11/2020 1:44:49 PM This report has been signed electronically. Number of Addenda: 0 Note Initiated On: 04/11/2020 1:12 PM Estimated Blood Loss:  Estimated blood loss: none.      [facility]

## 2020-04-11 NOTE — Plan of Care (Signed)
°  Problem: Clinical Measurements: °Goal: Respiratory complications will improve °Outcome: Progressing °  °Problem: Pain Managment: °Goal: General experience of comfort will improve °Outcome: Progressing °  °Problem: Safety: °Goal: Ability to remain free from injury will improve °Outcome: Progressing °  °

## 2020-04-11 NOTE — Anesthesia Postprocedure Evaluation (Signed)
Anesthesia Post Note  Patient: [name] [name]  Procedure(s) Performed: ESOPHAGOGASTRODUODENOSCOPY (EGD) WITH PROPOFOL (N/A )  Patient location during evaluation: Endoscopy Anesthesia Type: MAC Level of consciousness: awake and alert Pain management: pain level controlled Vital Signs Assessment: post-procedure vital signs reviewed and stable Respiratory status: spontaneous breathing, nonlabored ventilation, respiratory function stable and patient connected to nasal cannula oxygen Cardiovascular status: blood pressure returned to baseline and stable Postop Assessment: no apparent nausea or vomiting Anesthetic complications: no   No complications documented.   Last Vitals:  Vitals:   04/11/20 1408 04/11/20 1418  BP: 134/69 118/65  Pulse: 90 88  Resp: 20 20  Temp:    SpO2: 98% 96%    Last Pain:  Vitals:   04/11/20 1418  TempSrc:   PainSc: 0-No pain                 Arita Miss

## 2020-04-11 NOTE — Anesthesia Preprocedure Evaluation (Signed)
Anesthesia Evaluation  Patient identified by MRN, date of birth, ID band Patient awake    Reviewed: Allergy & Precautions, H&P , NPO status , Patient's Chart, lab work & pertinent test results, reviewed documented beta blocker date and time   History of Anesthesia Complications Negative for: history of anesthetic complications  Airway Mallampati: III  TM Distance: >3 FB Neck ROM: Full    Dental  (+) Edentulous Upper, Edentulous Lower   Pulmonary shortness of breath, sleep apnea and Continuous Positive Airway Pressure Ventilation , COPD, Not current smoker, former smoker,    Pulmonary exam normal breath sounds clear to auscultation       Cardiovascular Exercise Tolerance: Poor METS: < 3 Mets hypertension, + angina + CAD, + Past MI, + Cardiac Stents, + CABG, + Peripheral Vascular Disease and +CHF  Normal cardiovascular examIII+ Valvular Problems/Murmurs AS and MR  Rhythm:Regular Rate:Normal + Systolic murmurs Recent MI/Stent 03/23/2020. TTE: 1. Left ventricular ejection fraction, by estimation, is 30-35%. The left  ventricle has moderately decreased function. The left ventricle  demonstrates regional wall motion abnormalities, basal to mid inferior and  inferoseptal akinesis, basal to mid  inferolateral severe hypokinesis. The left ventricular internal cavity  size was mildly dilated. There is mild left ventricular hypertrophy. Left  ventricular diastolic parameters are consistent with Grade II diastolic  dysfunction (pseudonormalization).  2. Right ventricular systolic function is moderately reduced. The right  ventricular size is normal. There is mildly elevated pulmonary artery  systolic pressure. The estimated right ventricular systolic pressure is  16.1 mmHg.  3. Left atrial size was moderately dilated.  4. The mitral valve is degenerative. Moderate to severe mitral valve  regurgitation. No evidence of mitral stenosis.   5. The aortic valve is tricuspid. Aortic valve regurgitation is mild.  Moderate aortic valve stenosis. Aortic valve area, by VTI measures 1.08  cm. Aortic valve mean gradient measures 22.0 mmHg.  6. Aortic dilatation noted. There is mild dilatation of the ascending  aorta measuring 42 mm.  7. The inferior vena cava is dilated in size with >50% respiratory  variability, suggesting right atrial pressure of 8 mmHg.   LHC:  Ost RCA to Prox RCA lesion is 99% stenosed.  Post intervention, there is a 0% residual stenosis.  A drug-eluting stent was successfully placed using a STENT RESOLUTE ONYX 4.0X18.   Successful angioplasty and drug-eluting stent placement to the ostial right coronary artery.  60 mL of contrast was used.  Recommendations: Continue dual antiplatelet therapy for at least 6 months. The patient can undergo endoscopic GI procedures from a cardiac standpoint at an overall moderate risk.  Although his aortic stenosis is severe, it does not seem to be critical.  Maintain a hemoglobin above 8. The patient will likely require starting dialysis very soon in order to facilitate continued evaluation for TAVR. The patient is still volume overloaded and his LVEDP was 31 mmHg.  I changed torsemide to 40 mg twice daily. I discontinued heparin drip and nitroglycerin drip.    Neuro/Psych  Headaches, PSYCHIATRIC DISORDERS Anxiety Depression  Neuromuscular disease CVA    GI/Hepatic GERD  Controlled,  Endo/Other  diabetes  Renal/GU Renal disease     Musculoskeletal   Abdominal   Peds  Hematology   Anesthesia Other Findings Past Medical History: No date: 3-vessel CAD     Comment:  8/30 LHC with radial graft to RCA occluded at the ostium              with critical stenosis  of the ostial native RCA. No date: Adenomatous colon polyp No date: Allergy No date: Anxiety No date: Arthritis     Comment:  RA 2007: Bell's palsy No date: CAD (coronary artery disease)      Comment:  03/21/2020 LHC with patent LIMA to LAD and SVG to OM 3.                Radial graft to RCA occluded at the ostium with critical               stenosis of the ostial and native RCA. No date: Carotid artery occlusion     Comment:  Bilateral carotid stenosis s/p left-sided CEA. No date: Cataract     Comment:  Dr. Dawna Part No date: CKD (chronic kidney disease), stage IV ([place])     Comment:  03/2020 vascular surgery access planned No date: Depression No date: Diabetes mellitus No date: Diverticulosis No date: Duodenitis No date: DVT (deep venous thrombosis) (HCC)     Comment:  in leg No date: ESRD (end stage renal disease) ([place]) No date: Gastropathy No date: GERD (gastroesophageal reflux disease) No date: Heart failure with preserved ejection fraction ([place])     Comment:  8/30 RHC with moderately to severely elevated filling               pressures and pulmonary wedge pressure 31 mmHg, moderate               pulmonary hypertension No date: Helicobacter pylori gastritis No date: Hx of CABG     Comment:  2012 No date: Hyperlipidemia No date: Hypertension No date: Mitral regurgitation and aortic stenosis     Comment:  RHC 8/30 and echo 12/2019. No date: Myocardial infarction [facility]) No date: Neuropathy No date: Obesity No date: Peripheral vascular disease (HCC)     Comment:  S/p extensive revascularization by vascular surgery               05/2019 No date: Post splenectomy syndrome No date: Psoriasis No date: Severe aortic stenosis     Comment:  03/21/20 RHC with peak gradient 27.4 mmHg and valve area               0.87. No date: Sleep apnea     Comment:  uses cpap No date: Stroke [facility]) No date: Tobacco use disorder     Comment:  recently quit 08/2013: Tubular adenoma     Comment:  Dr. Hilarie Fredrickson No date: Weak urinary stream   Reproductive/Obstetrics                             Anesthesia Physical  Anesthesia Plan  ASA: IV  Anesthesia Plan: MAC    Post-op Pain Management:    Induction: Intravenous  PONV Risk Score and Plan: 1 and Midazolam, Ondansetron and Treatment may vary due to age or medical condition  Airway Management Planned: Nasal Cannula and Natural Airway  Additional Equipment: None  Intra-op Plan:   Post-operative Plan:   Informed Consent: I have reviewed the patients History and Physical, chart, labs and discussed the procedure including the risks, benefits and alternatives for the proposed anesthesia with the patient or authorized representative who has indicated his/her understanding and acceptance.     Dental Advisory Given  Plan Discussed with: CRNA and Surgeon  Anesthesia Plan Comments: (Patient with active recent cardiac process - s/p stent placement, with melena and anemia requiring blood transfusion. Per cardiology  no further interventions or optimizations warranted for this non-elective procedure. Risks of continued anemia and case delay are greater than risks of proceeding with case.  Discussed risks of anesthesia with patient, including possibility of difficulty with spontaneous ventilation under anesthesia necessitating airway intervention, PONV, and rare risks such as cardiac or respiratory or neurological events. Patient understands. Patient counseled on being higher risk for anesthesia due to comorbidities: CHF, very recent stent/MI, OSA. Patient was told about increased risk of cardiac and respiratory events, including death. Patient understands. )        Anesthesia Quick Evaluation

## 2020-04-11 NOTE — Telephone Encounter (Signed)
Palliative care SW LVM for patient to schedule on home visit. Awaiting return call.

## 2020-04-11 NOTE — Progress Notes (Signed)
Patient refused CPAP QHS, states he does not like it because the tubing is too short, mask doesn't fit, and is too loud. Assured patient the tubing is standard tubing and long enough to reach across bed and can place CPAP on opposite side of bed if preferred. Patient declined and does not want to wear oxygen in place of CPAP. RN at bedside and aware. CPAP removed from patient room. Patient advised to call Respiratory if he changes his mind.

## 2020-04-11 NOTE — Progress Notes (Signed)
Progress Note  Patient Name: [name] [name] Date of Encounter: 04/11/2020  Primary Cardiologist: [name], MD  Subjective   [name] denies any chest pain or pressure overnight. He is complaining of a nonproductive cough that was worse overnight and feeling quite cold. He feels like he has a "flu-bug." COVID negative this admission. His dyspnea is at baseline. He denies any chest pain, dizziness, presyncope, or syncope. His last bowel movement was yesterday and he reports it was dark. HGB 7.7-->8.7. BUN/SCr 100/4.03-->95/3.87. He is for EGD today.   Inpatient Medications    Scheduled Meds: . sodium chloride   Intravenous Once  . calcitRIOL  0.25 mcg Oral Daily  . carvedilol  12.5 mg Oral BID WC  . clopidogrel  75 mg Oral Daily  . insulin aspart  0-15 Units Subcutaneous Q4H  . isosorbide mononitrate  30 mg Oral Daily  . pantoprazole (PROTONIX) IV  40 mg Intravenous Q12H  . rosuvastatin  10 mg Oral Daily  . torsemide  40 mg Oral Daily   Continuous Infusions: . sodium chloride 30 mL/hr at 04/09/20 1656   PRN Meds: cyclobenzaprine, hydroxypropyl methylcellulose / hypromellose, ipratropium-albuterol, ondansetron **OR** ondansetron (ZOFRAN) IV   Vital Signs    Vitals:   04/10/20 1709 04/10/20 1744 04/10/20 2025 04/11/20 0423  BP: 127/82 119/65 121/77 (!) 108/46  Pulse: 94 86 85 89  Resp: 20 17  16   Temp: (!) 97.5 F (36.4 C) (!) 97.5 F (36.4 C) 97.8 F (36.6 C) 97.7 F (36.5 C)  TempSrc: Axillary Axillary Oral Oral  SpO2: 100% 100% 100% 97%  Weight:    103.6 kg  Height:        Intake/Output Summary (Last 24 hours) at 04/11/2020 0810 Last data filed at 04/10/2020 2025 Gross per 24 hour  Intake 340 ml  Output 0 ml  Net 340 ml   Filed Weights   04/09/20 0913 04/10/20 1542 04/11/20 0423  Weight: 102.1 kg 103.1 kg 103.6 kg    Physical Exam   GEN: Well nourished, well developed, in no acute distress.  HEENT: Grossly normal.  Neck: Supple, no JVD, carotid  bruits, or masses. Cardiac: RRR, III/VI harsh systolic murmur loudest at 2nd right ICS. Radials/DP/PT 2+ and equal bilaterally. 1+ edema bilateral lower extremities.  Respiratory:  Respirations regular and unlabored, breath sound decreased at bases. GI: Soft, nontender, nondistended, BS + x 4. MS: no deformity or atrophy. Skin: warm and dry, no rash. Neuro:  Strength and sensation are intact. Psych: AAOx3.  Normal affect.  Labs    Chemistry Recent Labs  Lab 04/09/20 0916 04/10/20 0501  NA 135 134*  K 4.7 4.3  CL 97* 96*  CO2 22 25  GLUCOSE 252* 189*  BUN 94* 100*  CREATININE 3.99* 4.03*  CALCIUM 8.5* 8.7*  GFRNONAA 15* 14*  GFRAA 17* 17*  ANIONGAP 16* 13     Hematology Recent Labs  Lab 04/09/20 0916 04/09/20 0916 04/09/20 1607 04/10/20 0501 04/10/20 1603  WBC 10.3  --   --   --  8.7  RBC 2.02*  --   --   --  2.49*  HGB 6.5*   < > 7.6* 8.0* 7.7*  HCT 19.7*   < > 23.1* 24.3* 23.3*  MCV 97.5  --   --   --  93.6  MCH 32.2  --   --   --  30.9  MCHC 33.0  --   --   --  33.0  RDW 16.5*  --   --   --  17.2*  PLT 162  --   --   --  160   < > = values in this interval not displayed.    Cardiac Enzymes  Recent Labs  Lab 03/20/20 1255 04/09/20 0916 04/09/20 1115 04/09/20 1428 04/09/20 1607  TROPONINIHS 86* 129* 223* 402* 604*      BNPNo results for input(s): BNP, PROBNP in the last 168 hours.   DDimer No results for input(s): DDIMER in the last 168 hours.   Lipids  Lab Results  Component Value Date   CHOL 122 02/13/2020   HDL 33 (L) 02/13/2020   LDLCALC 56 02/13/2020   LDLDIRECT 61.0 03/15/2017   TRIG 163 (H) 02/13/2020   CHOLHDL 3.7 02/13/2020    HbA1c  Lab Results  Component Value Date   HGBA1C 5.7 (H) 02/13/2020    Radiology    DG Chest 2 View  Result Date: 04/09/2020 IMPRESSION: 1. Coarsened lung markings have improved but persists consistent with bronchitic change versus atypical infection. Pulmonary venous congestion possible. 2. More  focal opacities in the bases are mild and may simply represent atelectasis. Early infiltrates possible. Recommend attention on follow-up. Electronically Signed   By: Dorise Bullion III M.D   On: 04/09/2020 10:18    Telemetry    Sinus rhythm with occasional PVCs.  - Personally Reviewed  ECG    No new ECG since 09/18. - Personally Reviewed  Cardiac Studies   Echo 03/22/2020 1. Left ventricular ejection fraction, by estimation, is 30-35%. The left  ventricle has moderately decreased function. The left ventricle  demonstrates regional wall motion abnormalities, basal to mid inferior and  inferoseptal akinesis, basal to mid  inferolateral severe hypokinesis. The left ventricular internal cavity  size was mildly dilated. There is mild left ventricular hypertrophy. Left  ventricular diastolic parameters are consistent with Grade II diastolic  dysfunction (pseudonormalization).  2. Right ventricular systolic function is moderately reduced. The right  ventricular size is normal. There is mildly elevated pulmonary artery  systolic pressure. The estimated right ventricular systolic pressure is  63.8 mmHg.  3. Left atrial size was moderately dilated.  4. The mitral valve is degenerative. Moderate to severe mitral valve  regurgitation. No evidence of mitral stenosis.  5. The aortic valve is tricuspid. Aortic valve regurgitation is mild.  Moderate aortic valve stenosis. Aortic valve area, by VTI measures 1.08  cm. Aortic valve mean gradient measures 22.0 mmHg.  6. Aortic dilatation noted. There is mild dilatation of the ascending  aorta measuring 42 mm.  7. The inferior vena cava is dilated in size with >50% respiratory  variability, suggesting right atrial pressure of 8 mmHg.   Cardiac Cath 03/23/2020  Ost RCA to Prox RCA lesion is 99% stenosed.  Post intervention, there is a 0% residual stenosis.  A drug-eluting stent was successfully placed using a STENT RESOLUTE ONYX  4.0X18.   Patient Profile     64 y.o. male who was admitted to the hospital for acute GI bleed, chest pain, and minimally elevated troponin.   Assessment & Plan    1. Chest pain, minimally elevated troponins:  -No chest pain or pressure overnight  -HS-Tn peaked at 604 and has been felt to be secondary to supply demand ischemia in the setting of underlying severe multivessel CAD, moderate to severe AS, worsening anemia of GI bleed, and ESRD -Trend HS-Tn to ensure peak -Recent LHC and PCI/DES to RCA on 03/23/2020 -No plan for ischemic workup at this time -Continue Imdur 30mg  daily  2. 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 CAD s/p CABG in 2012 s/p recent PCI/DES to RCA on 03/23/2020: -No angina  -ASA held with worsening anemia/GI bleed -Continue Plavix 75mg , rosuvastatin 10mg , Imdur 30mg  daily   4. HFrEF secondary to ICM, EF 30-35%:  -NYHA class III-IV symptoms -Suspect he continues to be mildly volume up, though body habitus makes this difficult  -Continue coreg 12.5mg  BID and Imdur -Add hydralazine 10 mg tid post EGD as BP allows -Torsemide 40 mg daily -Not on ACEi/ARB/ARNI/MRA secondary to CKD -Strict I/O -Daily weights -CHF education   5. Acute kidney injury in the setting of CKD, stage IV:  -Followed by nephrology with no acute indication for dialysis  -Continued outpatient workup of his AS has been limited by renal function status -Vein mapping with Dr. Ronalee Belts on 04/21/20 prior to  obtaining vascular access  -Continue to monitor renal function   6. Moderate to severe aortic stenosis:  -Echo from 8/32021 reveals moderate AS with aortic valve area 1.08 cm, aortic valve mean gradient measures 22.0 mmHg -Cannot exclude low flow low gradient AS -Follow up with structural heart team  7. Hypertension:  -He reports some occasional orthostatic lightheadedness which is unchanged from his baseline -BP has been well controlled -Continue regimen as above  8. HLD:  -LDL 56 -Crestor 10mg   Signed, Clare Charon, Student-PA  04/11/2020, 8:10 AM    For questions or updates, please contact   Please consult [email or web address] for contact info under Cardiology/STEMI.    Patient was discussed on rounds and examined. Changes made above as indicated.   Christell Faith, PA-C 04/11/2020 10:35 AM

## 2020-04-11 NOTE — Plan of Care (Signed)

## 2020-04-11 NOTE — Progress Notes (Signed)
[facility]  ROUNDING NOTE   Subjective:    Patient continues to refuse parts of his care. Endoscopy is scheduled for today.   Objective:  Vital signs in last 24 hours:  Temp:  [97.5 F (36.4 C)-98.9 F (37.2 C)] 98.9 F (37.2 C) (09/20 1136) Pulse Rate:  [85-94] 87 (09/20 1136) Resp:  [16-21] 17 (09/20 1136) BP: (108-153)/(46-131) 128/74 (09/20 1136) SpO2:  [97 %-100 %] 100 % (09/20 1136) Weight:  [103.1 kg-103.6 kg] 103.6 kg (09/20 0423)  Weight change: 1.043 kg Filed Weights   04/09/20 0913 04/10/20 1542 04/11/20 0423  Weight: 102.1 kg 103.1 kg 103.6 kg    Intake/Output: I/O last 3 completed shifts: In: 340 [Blood:340] Out: 0    Intake/Output this shift:  No intake/output data recorded.  Physical Exam: General: NAD, laying in bed  Head: Normocephalic, atraumatic. Moist oral mucosal membranes  Eyes: Anicteric, PERRL  Neck: Supple, trachea midline  Lungs:  Clear to auscultation  Heart: Regular rate and rhythm  Abdomen:  Soft, nontender,   Extremities:  no peripheral edema.  Neurologic: Nonfocal, moving all four extremities  Skin: No lesions  Access: none    Basic Metabolic Panel: Recent Labs  Lab 04/09/20 0916 04/10/20 0501 04/11/20 0903  NA 135 134* 136  K 4.7 4.3 3.8  CL 97* 96* 98  CO2 22 25 24   GLUCOSE 252* 189* 138*  BUN 94* 100* 95*  CREATININE 3.99* 4.03* 3.87*  CALCIUM 8.5* 8.7* 8.3*    Liver Function Tests: No results for input(s): AST, ALT, ALKPHOS, BILITOT, PROT, ALBUMIN in the last 168 hours. No results for input(s): LIPASE, AMYLASE in the last 168 hours. No results for input(s): AMMONIA in the last 168 hours.  CBC: Recent Labs  Lab 04/09/20 0916 04/09/20 1607 04/10/20 0501 04/10/20 1603 04/11/20 0903  WBC 10.3  --   --  8.7 7.0  HGB 6.5* 7.6* 8.0* 7.7* 8.7*  HCT 19.7* 23.1* 24.3* 23.3* 25.4*  MCV 97.5  --   --  93.6 88.8  PLT 162  --   --  160 163    Cardiac Enzymes: No results for input(s): CKTOTAL, CKMB,  CKMBINDEX, TROPONINI in the last 168 hours.  BNP: Invalid input(s): POCBNP  CBG: Recent Labs  Lab 04/10/20 2018 04/11/20 0011 04/11/20 0501 04/11/20 0816 04/11/20 1137  GLUCAP 139* 148* 110* 121* 109*    Microbiology: Results for orders placed or performed during the hospital encounter of 04/09/20  SARS Coronavirus 2 by RT PCR (hospital order, performed in [facility] hospital lab) Nasopharyngeal Nasopharyngeal Swab     Status: None   Collection Time: 04/09/20 11:12 AM   Specimen: Nasopharyngeal Swab  Result Value Ref Range Status   SARS Coronavirus 2 NEGATIVE NEGATIVE Final    Comment: (NOTE) SARS-CoV-2 target nucleic acids are NOT DETECTED.  The SARS-CoV-2 RNA is generally detectable in upper and lower respiratory specimens during the acute phase of infection. The lowest concentration of SARS-CoV-2 viral copies this assay can detect is 250 copies / mL. A negative result does not preclude SARS-CoV-2 infection and should not be used as the sole basis for treatment or other patient management decisions.  A negative result may occur with improper specimen collection / handling, submission of specimen other than nasopharyngeal swab, presence of viral mutation(s) within the areas targeted by this assay, and inadequate number of viral copies (<250 copies / mL). A negative result must be combined with clinical observations, patient history, and epidemiological information.  Fact Sheet  for Patients:   StrictlyIdeas.no  Fact Sheet for Healthcare Providers: BankingDealers.co.za  This test is not yet approved or  cleared by the [place] FDA and has been authorized for detection and/or diagnosis of SARS-CoV-2 by FDA under an Emergency Use Authorization (EUA).  This EUA will remain in effect (meaning this test can be used) for the duration of the COVID-19 declaration under Section 564(b)(1) of the Act, 21 U.S.C. section  360bbb-3(b)(1), unless the authorization is terminated or revoked sooner.  Performed at [facility], [address] [place], [place] [address]     Coagulation Studies: No results for input(s): LABPROT, INR in the last 72 hours.  Urinalysis: No results for input(s): COLORURINE, LABSPEC, PHURINE, GLUCOSEU, HGBUR, BILIRUBINUR, KETONESUR, PROTEINUR, UROBILINOGEN, NITRITE, LEUKOCYTESUR in the last 72 hours.  Invalid input(s): APPERANCEUR    Imaging: No results found.   Medications:   . sodium chloride 30 mL/hr at 04/09/20 1656   . sodium chloride   Intravenous Once  . calcitRIOL  0.25 mcg Oral Daily  . carvedilol  12.5 mg Oral BID WC  . clopidogrel  75 mg Oral Daily  . insulin aspart  0-15 Units Subcutaneous Q4H  . isosorbide mononitrate  30 mg Oral Daily  . pantoprazole (PROTONIX) IV  40 mg Intravenous Q12H  . rosuvastatin  10 mg Oral Daily  . torsemide  40 mg Oral Daily   cyclobenzaprine, hydroxypropyl methylcellulose / hypromellose, ipratropium-albuterol, ondansetron **OR** ondansetron (ZOFRAN) IV  Assessment/ Plan:  [name] is a 64 y.o. white male with peripheral vascular disease, diabetes mellitus type 2 insulin dependent, hypertension, major depressive disorder, tobacco use, psoriasis, diabetic neuropathy, splenectomy, carotid artery stenosis, coronary artery disease, hyperlipidemia, obstructive sleep apnea, history of DVT, CVA admitted to [facility] on 04/09/2020 for Acute blood loss anemia [D62] Atypical chest pain [R07.89] Symptomatic anemia [D64.9]  1. Acute renal failure on chronic kidney disease stage IV with proteinuria: Creatinine may be at his new baseline. 3.87, GFR of 15.  Recent acute renal failure from IV contrast nephropathy.  Now acute renal failure from acute blood loss anemia.  Chronic kidney disease secondary to diabetic nephropathy, NSAID induced nephropathy and hypertensive nephrosclerosis. No acute indication for dialysis.  -  holding losartan  2. Hypertension: 128/74. Home regimen of torsemide, carvedilol, tamsulosin   3. Diabetes Mellitus type II Insulin Dependent with chronic kidney disease: insulin dependent. hemoglobin A1c 5.7% on 02/13/20  4. Secondary Hyperparathyroidism: PTH elevated at 133. Calcium and phosphorus at goal.  Not currently on a phosphorus binder.  - Continue calcitriol  5. Anemia with chronic kidney disease: and now with GI bleed Status post PRBC transfusion on admission - Appreciate cardiology and gastroenterology input.    LOS: 2 [place] Beem 9/20/202112:10 PM

## 2020-04-12 ENCOUNTER — Other Ambulatory Visit: Payer: Self-pay | Admitting: Physician Assistant

## 2020-04-12 ENCOUNTER — Telehealth: Payer: Self-pay | Admitting: Physician Assistant

## 2020-04-12 ENCOUNTER — Telehealth: Payer: Self-pay | Admitting: Cardiovascular Disease

## 2020-04-12 ENCOUNTER — Encounter: Payer: Self-pay | Admitting: Physician Assistant

## 2020-04-12 DIAGNOSIS — N186 End stage renal disease: Secondary | ICD-10-CM

## 2020-04-12 DIAGNOSIS — E1122 Type 2 diabetes mellitus with diabetic chronic kidney disease: Secondary | ICD-10-CM

## 2020-04-12 DIAGNOSIS — I350 Nonrheumatic aortic (valve) stenosis: Secondary | ICD-10-CM

## 2020-04-12 DIAGNOSIS — N184 Chronic kidney disease, stage 4 (severe): Secondary | ICD-10-CM

## 2020-04-12 LAB — BPAM RBC
Blood Product Expiration Date: 202109222359
Blood Product Expiration Date: 202110112359
Blood Product Expiration Date: 202110112359
Blood Product Expiration Date: 202110122359
Blood Product Expiration Date: 202110122359
ISSUE DATE / TIME: 202109181125
ISSUE DATE / TIME: 202109181231
ISSUE DATE / TIME: 202109191723
Unit Type and Rh: 600
Unit Type and Rh: 6200
Unit Type and Rh: 6200
Unit Type and Rh: 6200
Unit Type and Rh: 6200

## 2020-04-12 LAB — TYPE AND SCREEN
ABO/RH(D): AB POS
Antibody Screen: NEGATIVE
Unit division: 0
Unit division: 0
Unit division: 0
Unit division: 0
Unit division: 0

## 2020-04-12 LAB — BASIC METABOLIC PANEL
Anion gap: 13 (ref 5–15)
BUN: 90 mg/dL — ABNORMAL HIGH (ref 8–23)
CO2: 25 mmol/L (ref 22–32)
Calcium: 8.2 mg/dL — ABNORMAL LOW (ref 8.9–10.3)
Chloride: 98 mmol/L (ref 98–111)
Creatinine, Ser: 3.89 mg/dL — ABNORMAL HIGH (ref 0.61–1.24)
GFR calc Af Amer: 18 mL/min — ABNORMAL LOW (ref >60)
GFR calc non Af Amer: 15 mL/min — ABNORMAL LOW (ref >60)
Glucose, Bld: 120 mg/dL — ABNORMAL HIGH (ref 70–99)
Potassium: 3.7 mmol/L (ref 3.5–5.1)
Sodium: 136 mmol/L (ref 135–145)

## 2020-04-12 LAB — GLUCOSE, CAPILLARY
Glucose-Capillary: 102 mg/dL — ABNORMAL HIGH (ref 70–99)
Glucose-Capillary: 109 mg/dL — ABNORMAL HIGH (ref 70–99)
Glucose-Capillary: 126 mg/dL — ABNORMAL HIGH (ref 70–99)
Glucose-Capillary: 145 mg/dL — ABNORMAL HIGH (ref 70–99)

## 2020-04-12 LAB — CBC
HCT: 25.9 % — ABNORMAL LOW (ref 39.0–52.0)
Hemoglobin: 8.7 g/dL — ABNORMAL LOW (ref 13.0–17.0)
MCH: 30.3 pg (ref 26.0–34.0)
MCHC: 33.6 g/dL (ref 30.0–36.0)
MCV: 90.2 fL (ref 80.0–100.0)
Platelets: 171 10*3/uL (ref 150–400)
RBC: 2.87 MIL/uL — ABNORMAL LOW (ref 4.22–5.81)
RDW: 19 % — ABNORMAL HIGH (ref 11.5–15.5)
WBC: 6.8 10*3/uL (ref 4.0–10.5)
nRBC: 0 % (ref 0.0–0.2)

## 2020-04-12 LAB — PREPARE RBC (CROSSMATCH)

## 2020-04-12 MED ORDER — PREDNISONE 50 MG PO TABS: 50.0000 mg | ORAL_TABLET | ORAL | 0 refills | Status: DC

## 2020-04-12 NOTE — Care Management Important Message (Signed)
Important Message  Patient Details  Name: [name] [name] MRN: [id number] Date of Birth: 1955-11-05   Medicare Important Message Given:  Yes     [name] 04/12/2020, 11:18 AM

## 2020-04-12 NOTE — Plan of Care (Signed)

## 2020-04-12 NOTE — Telephone Encounter (Addendum)
  [facility]   MULTIDISCIPLINARY HEART VALVE TEAM  [name] was seen by our multidisciplinary valve team during a recent admission for chest pain.  He underwent PCI and we started the TAVR work-up for severe AS. Plan was to continue work up in the outpatient setting given advanced CKD with impending need HD. He was more recently admitted to [facility] for acute blood loss anemia from a GI bleed with worsening of his renal function. GFR 15. No indication for HD at present moment. He was seen by Dr. Saunders Revel and Dr. [name] with nephrology. Per nephrology: " Patient will need dialysis access prior to TAVR. Even if this is a tunneled catheter. Patient needs close follow up with nephrology and vascular surgery. Patient and I discussed the risk of contrast induced nephropathy and the risk of requiring dialysis after contrast induced nephropathy and that dialysis may be permanent. Patient states understanding and willing to proceed with plan of care."  Dr. Saunders Revel reached out to see if we could proceed with CT scans. Given consent from patient and nephrology, we will set these up for next Monday. We will not hydrate prior given low EF and recent heart failure. I will call in prednisone to treat for contrast dye allergy. We will check a BMET on 04/22/20 to follow renal function. Pt is aware that this scan may precipitate the need for hemodialysis.   Angelena Form PA-C  [facility]

## 2020-04-12 NOTE — Progress Notes (Signed)
Patient given discharge instructions. IV taken out and tele monitor off. Patient verbalized understanding without any questions or concerns. Patient stable for discharge.

## 2020-04-12 NOTE — Discharge Summary (Signed)
[name] [name] VQQ:[id number] DOB: 1955/09/30 DOA: 04/09/2020  PCP: [name], MD  Admit date: 04/09/2020 Discharge date: 04/12/2020  Time spent: 35 min minutes  Recommendations for Outpatient Follow-up:  1. Follow-up with cardiology for pre-TAVR gated CT, which cardiology will coordinate 2. Follow-up primary cardiologist within 1 week, cardiology to coordinate 3. Follow-up with primary nephrologist within 1 week, nephrology to coordinate 4. Follow-up with primary gastroenterologist (Pyrtle) within the next few weeks, patient to coordinate 5. Follow-up PCP within the next 2 weeks, patient to coordinate 6. Patient will need to be monitored for ongoing melena and for anemia   Discharge Diagnoses:  Principal Problem:   Acute blood loss anemia Active Problems:   3-vessel CAD   CKD stage 4 due to type 2 diabetes mellitus (HCC)   Chronic combined systolic and diastolic CHF (congestive heart failure) (HCC)   CKD (chronic kidney disease), stage IV (HCC)   Depression   Aortic stenosis   Elevated troponin I level   Demand ischemia [facility])   Discharge Condition: fair  Diet recommendation: heart healthy  Filed Weights   04/10/20 1542 04/11/20 0423 04/12/20 0248  Weight: 103.1 kg 103.6 kg 102.2 kg    History of present illness:  [name] [name] a 64 y.o.malewith medical history significant forcoronary artery disease status post three-vessel CABG, status post recent PCI with stent angioplasty, history of diabetes, stage IV chronic kidney disease and hypertension who presents to the ER for evaluation of chest painthatstarted in the early hours of the morning prior to his admission. Patient states that chest pain started at rest was mostly over the left anterior chest wall and was nonradiating nonradiating. He rated his chest pain an 8 x 10 in intensity at its worst, worse with exertion and associated with nausea and diaphoresis but he denied having any vomiting, shortness of  breath or palpitations. Chest pain does not feel like his prior episodes of heart attack and it was not relieved by nitroglycerin. Patient states that he has had black stools for about 2 days and complains of feeling very weak but denies feeling dizzy or lightheaded.He denies having any falls or loss of consciousness. He denies any NSAID use and denies having any hematemesis or hematochezia. He has no abdominal pain. Labs show sodium 135, potassium 4.7, chloride 97, bicarb 22, glucose 252, BUN 94, creatinine 3.9, calcium 9.5, troponin129 >> 223, white count 10.3, hemoglobin 6.5, hematocrit 19.7, MCV 97.5, RDW 16.5, platelet count 162 Chest x-ray reviewed by me shows pulmonary venous congestion Twelve-lead EKG reviewed by me shows sinus tachycardia with a left bundle branch block   ED Course:Patient is a 64 year old male with a history of diabetes mellitus with complications of stage IV chronic kidney disease, history of coronary artery disease status post CABG status post stent angioplasty who presents to the ER for evaluation of chest pain. He is noted to have a drop in his hemoglobin from 9.4g/dlabout a week ago to 6.5g/dl.He also gives a 2-day history of passage of melena stools. Twelve-lead EKG does not show any acute findings. Patient received 2 units of packed RBC in the ER. Patient will be admitted to the hospital for further evaluation.  Hospital Course:   # Acute blood loss anemia From a gastrointestinal source. Patient presented with 2 days of passage of melena stools but denies having any abdominal pain. He is on aspirin and Plavix following recent stent angioplasty. Transfused a total of 3 units prbcs. Post-tx h 8.7 on 9/21 and remained 8.7  on day of discharge. Gi advises no further inpatient w/u with close gastroenterology f/u as outpatient - continue pantop po bid - hold aspirin, continue plavix per cardiology  # Coronary artery disease Patient has a history of  coronary artery disease and is status post three-vessel CABG as well as recent stent angioplasty for which he was placed on dual antiplatelet therapy. Patient presentedfor evaluation of chest pain and noted to have bumped his troponin from 129 >> 233. Likely demand from acute blood loss, chest pain has resolved. - monitor - will re-start coreg, will resume imdur assuming pressures remain stable - f/u cardiology recs when available  # Chronic combined systolic and diastolic dysfunction CHF Last known LVEF of 30 to 35%. Euvolemic on exam. - continued home torsemide 40 qdand imdur and coreg  # Severe aortic stenosis - close outpatient f/u for TAVR w/u  # CKD4 - at (likely new) baseline, k wnl.  - nephrology consulted - holding home losartan for now  #Diabetes mellitus with complications of stage IV chronic kidney disease Controlled  # OSA - qhs cpap ordered  Procedures:  EGD, blood transfusion 3 units  Consultations:  Cardiology, nephrology, gastroenterology  Discharge Exam: Vitals:   04/12/20 0826 04/12/20 1204  BP: 131/61 (!) 117/99  Pulse: 88 74  Resp: 19 19  Temp: 97.8 F (36.6 C) 97.7 F (36.5 C)  SpO2: 98% 98%    General exam: Appears calm and comfortable  Respiratory system: Clear to auscultation. Respiratory effort normal. Cardiovascular system: harsh holosystolic murmur, rr Gastrointestinal system: Abdomen is obese, soft and nontender. No organomegaly or masses felt. Normal bowel sounds heard. Central nervous system: Alert and oriented. No focal neurological deficits. Extremities: Symmetric 5 x 5 power. 1+ pitting edema LEs Skin: No rashes, lesions or ulcers Psychiatry: Judgement and insight appear normal. Mood & affect appropriate.   Discharge Instructions   Discharge Instructions    Call MD for:  difficulty breathing, headache or visual disturbances   Complete by: As directed    Call MD for:  extreme fatigue   Complete by: As directed    Call  MD for:  persistant dizziness or light-headedness   Complete by: As directed    Call MD for:  persistant nausea and vomiting   Complete by: As directed    Call MD for:  redness, tenderness, or signs of infection (pain, swelling, redness, odor or green/yellow discharge around incision site)   Complete by: As directed    Call MD for:  temperature >100.4   Complete by: As directed    Diet - low sodium heart healthy   Complete by: As directed    Increase activity slowly   Complete by: As directed      Allergies as of 04/12/2020      Reactions   Contrast Media [iodinated Diagnostic Agents] Rash, Other (See Comments)   Got very hot and red   Glipizide Other (See Comments)   ANTIDIABETICS. Burning   Hydroxychloroquine Other (See Comments)   Stomach upset   Metrizamide Other (See Comments)   Got very hot and red   Penicillins Hives, Swelling   Has patient had a PCN reaction causing immediate rash, facial/tongue/throat swelling, SOB or lightheadedness with hypotension: yes Has patient had a PCN reaction causing severe rash involving mucus membranes or skin necrosis: no  Has patient had a PCN reaction that required hospitalization: yes Has patient had a PCN reaction occurring within the last 10 years: no If all of the above answers  are "NO", then may proceed with Cephalosporin use.      Medication List    STOP taking these medications   aspirin 81 MG tablet     TAKE these medications   albuterol 108 (90 Base) MCG/ACT inhaler Commonly known as: ProAir HFA Inhale 2 puffs into the lungs every 6 (six) hours as needed for shortness of breath.   Artificial Tears 0.1-0.3 % Soln Generic drug: Dextran 70-Hypromellose Place 1 drop into both eyes 4 (four) times daily as needed (dry eyes).   busPIRone 10 MG tablet Commonly known as: BUSPAR TAKE 2 TABLETS(20 MG) BY MOUTH TWICE DAILY What changed:   how much to take  how to take this  when to take this  additional instructions    calcitRIOL 0.25 MCG capsule Commonly known as: ROCALTROL Take 0.25 mcg by mouth every Monday, Wednesday, and Friday. Monday then Wednesday then Friday   carvedilol 12.5 MG tablet Commonly known as: COREG Take 1 tablet (12.5 mg total) by mouth 2 (two) times daily. What changed: when to take this   clopidogrel 75 MG tablet Commonly known as: PLAVIX Take 1 tablet (75 mg total) by mouth daily.   cyclobenzaprine 10 MG tablet Commonly known as: FLEXERIL Take 10 mg by mouth 3 (three) times daily as needed for muscle spasms.   finasteride 5 MG tablet Commonly known as: PROSCAR Take 5 mg by mouth daily.   hydrALAZINE 25 MG tablet Commonly known as: APRESOLINE Take 1 tablet (25 mg total) by mouth every 8 (eight) hours.   insulin aspart 100 UNIT/ML injection Commonly known as: novoLOG Inject 0-9 Units into the skin 3 (three) times daily with meals.   isosorbide mononitrate 30 MG 24 hr tablet Commonly known as: IMDUR Take 3 tablets (90 mg total) by mouth daily.   nitroGLYCERIN 0.4 MG SL tablet Commonly known as: NITROSTAT Place 1 tablet (0.4 mg total) under the tongue every 5 (five) minutes as needed. Maximum of 3 doses. What changed: reasons to take this   rosuvastatin 10 MG tablet Commonly known as: CRESTOR Take 1 tablet (10 mg total) by mouth daily.   sertraline 100 MG tablet Commonly known as: ZOLOFT Take 1 tablet (100 mg total) by mouth daily.   tamsulosin 0.4 MG Caps capsule Commonly known as: FLOMAX Take 1 capsule by mouth daily What changed:   how much to take  how to take this  when to take this  additional instructions   torsemide 20 MG tablet Commonly known as: DEMADEX Take 2 tablets (40 mg total) by mouth 2 (two) times daily.      Allergies  Allergen Reactions  . Contrast Media [Iodinated Diagnostic Agents] Rash and Other (See Comments)    Got very hot and red   . Glipizide Other (See Comments)    ANTIDIABETICS. Burning  . Hydroxychloroquine  Other (See Comments)    Stomach upset  . Metrizamide Other (See Comments)    Got very hot and red  . Penicillins Hives and Swelling    Has patient had a PCN reaction causing immediate rash, facial/tongue/throat swelling, SOB or lightheadedness with hypotension: yes Has patient had a PCN reaction causing severe rash involving mucus membranes or skin necrosis: no  Has patient had a PCN reaction that required hospitalization: yes Has patient had a PCN reaction occurring within the last 10 years: no If all of the above answers are "NO", then may proceed with Cephalosporin use.     Follow-up Information    [name]  A, MD On 04/19/2020.   Specialty: Cardiology Why: @ 3:30pm Contact information: [place] [address] [phone]                The results of significant diagnostics from this hospitalization (including imaging, microbiology, ancillary and laboratory) are listed below for reference.    Significant Diagnostic Studies: DG Chest 2 View  Result Date: 04/09/2020 CLINICAL DATA:  Shortness of breath. History of heart disease. Former smoker. EXAM: CHEST - 2 VIEW COMPARISON:  March 20, 2020 FINDINGS: The heart, hila, and mediastinum are normal. Coarsened interstitial lung markings remain but are improved. Mildly more focal opacities in the bases, left greater than right. No pneumothorax. No nodules or masses. No other abnormalities. IMPRESSION: 1. Coarsened lung markings have improved but persists consistent with bronchitic change versus atypical infection. Pulmonary venous congestion possible. 2. More focal opacities in the bases are mild and may simply represent atelectasis. Early infiltrates possible. Recommend attention on follow-up. Electronically Signed   By: Dorise Bullion III M.D   On: 04/09/2020 10:18   CARDIAC CATHETERIZATION  Result Date: 03/23/2020  Ost RCA to Prox RCA lesion is 99% stenosed.  Post intervention, there is a 0%  residual stenosis.  A drug-eluting stent was successfully placed using a STENT RESOLUTE ONYX 4.0X18.  Successful angioplasty and drug-eluting stent placement to the ostial right coronary artery.  60 mL of contrast was used. Recommendations: Continue dual antiplatelet therapy for at least 6 months. The patient can undergo endoscopic GI procedures from a cardiac standpoint at an overall moderate risk.  Although his aortic stenosis is severe, it does not seem to be critical. Maintain a hemoglobin above 8. The patient will likely require starting dialysis very soon in order to facilitate continued evaluation for TAVR. The patient is still volume overloaded and his LVEDP was 31 mmHg.  I changed torsemide to 40 mg twice daily. I discontinued heparin drip and nitroglycerin drip.   CARDIAC CATHETERIZATION  Result Date: 03/21/2020  Colon Flattery Cx to Prox Cx lesion is 85% stenosed.  Mid Cx to Dist Cx lesion is 100% stenosed.  1st Mrg lesion is 70% stenosed.  Prox LAD to Mid LAD lesion is 90% stenosed.  Ost RCA to Prox RCA lesion is 99% stenosed.  SVG.  The graft exhibits mild diffuse disease.  Left radial artery graft was visualized by angiography.  Origin lesion is 100% stenosed.  LIMA graft was visualized by non-selective angiography and is normal in caliber.  The graft exhibits no disease.  1.  Significant underlying three-vessel coronary artery disease with patent LIMA to LAD and SVG to OM 3.  Radial graft to RCA is occluded at the ostium with critical stenosis of the ostial native right coronary artery.  This is the likely culprit for recent non-STEMI and current unstable angina. 2.  Severe aortic stenosis with peak gradient of 27.4 mmHg and valve area of 0.87. 3.  Right heart catheterization showed moderately to severely elevated filling pressures with pulmonary wedge pressure of 31 mmHg, moderate pulmonary hypertension at 50/30 mmHg and normal cardiac output at 5.06 with a cardiac index of 2.36.  Prominent V  waves on wedge pressure tracing suggestive of significant mitral regurgitation. Recommendations: This is an overall difficult case.  The patient will need to be evaluated for TAVR plus RCA PCI.  Advanced chronic kidney disease and anemia in addition to significant heart failure make timing of this difficult.  The patient will likely need to be initiated  on hemodialysis in order to improve his volume status.  He needs blood transfusion to keep his hemoglobin above 9.  Nonetheless, he might not be able to tolerate large volume shifts due to significant coronary artery disease and aortic stenosis. I will discuss with our structural heart team.  It might be best to transfer the patient to [facility] Vascular access on this patient is very difficult due to extensive stenting and calcifications of the lower extremities.  The SFA stent on the right side might be extending into the common femoral artery.  Arterial access might be easier via the left femoral artery.   DG Chest Portable 1 View  Result Date: 03/20/2020 CLINICAL DATA:  Chest pain. EXAM: PORTABLE CHEST 1 VIEW COMPARISON:  February 15, 2020 FINDINGS: No pneumothorax. Stable cardiomegaly. Diffuse interstitial opacities are identified. No nodules or masses. No focal infiltrates. IMPRESSION: Diffuse interstitial opacities suggestive of pulmonary edema given cardiomegaly. Atypical infection could have a similar appearance. Recommend clinical correlation. Electronically Signed   By: Dorise Bullion III M.D   On: 03/20/2020 08:08   ECHOCARDIOGRAM COMPLETE  Result Date: 03/22/2020    ECHOCARDIOGRAM REPORT   Patient Name:   [name] [name] Date of Exam: 03/22/2020 Medical Rec #:  [id number]        Height:       66.0 in Accession #:    [id number]       Weight:       233.9 lb Date of Birth:  [birth year]         BSA:          2.137 m Patient Age:    3 years         BP:           113/70 mmHg Patient Gender: M                HR:           79 bpm. Exam Location:   Inpatient Procedure: 2D Echo, Cardiac Doppler and Color Doppler Indications:    Chest Pain 786.50 / R07.9  History:        Patient has prior history of Echocardiogram examinations, most                 recent 01/14/2020. CAD, Stroke; Risk Factors:Hypertension,                 Dyslipidemia, Diabetes and GERD.  Sonographer:    Bernadene Person RDCS Referring Phys: 35 [place]  1. Left ventricular ejection fraction, by estimation, is 30-35%. The left ventricle has moderately decreased function. The left ventricle demonstrates regional wall motion abnormalities, basal to mid inferior and inferoseptal akinesis, basal to mid inferolateral severe hypokinesis. The left ventricular internal cavity size was mildly dilated. There is mild left ventricular hypertrophy. Left ventricular diastolic parameters are consistent with Grade II diastolic dysfunction (pseudonormalization).  2. Right ventricular systolic function is moderately reduced. The right ventricular size is normal. There is mildly elevated pulmonary artery systolic pressure. The estimated right ventricular systolic pressure is 00.8 mmHg.  3. Left atrial size was moderately dilated.  4. The mitral valve is degenerative. Moderate to severe mitral valve regurgitation. No evidence of mitral stenosis.  5. The aortic valve is tricuspid. Aortic valve regurgitation is mild. Moderate aortic valve stenosis. Aortic valve area, by VTI measures 1.08 cm. Aortic valve mean gradient measures 22.0 mmHg.  6. Aortic dilatation noted. There is mild dilatation of the ascending aorta  measuring 42 mm.  7. The inferior vena cava is dilated in size with >50% respiratory variability, suggesting right atrial pressure of 8 mmHg. FINDINGS  Left Ventricle: Left ventricular ejection fraction, by estimation, is 30 to 35%. The left ventricle has moderately decreased function. The left ventricle demonstrates regional wall motion abnormalities. The left ventricular internal cavity  size was mildly dilated. There is mild left ventricular hypertrophy. Left ventricular diastolic parameters are consistent with Grade II diastolic dysfunction (pseudonormalization). Right Ventricle: The right ventricular size is normal. No increase in right ventricular wall thickness. Right ventricular systolic function is moderately reduced. There is mildly elevated pulmonary artery systolic pressure. The tricuspid regurgitant velocity is 2.71 m/s, and with an assumed right atrial pressure of 8 mmHg, the estimated right ventricular systolic pressure is 60.4 mmHg. Left Atrium: Left atrial size was moderately dilated. Right Atrium: Right atrial size was normal in size. Pericardium: There is no evidence of pericardial effusion. Mitral Valve: The mitral valve is degenerative in appearance. There is moderate calcification of the mitral valve leaflet(s). Mild to moderate mitral annular calcification. Moderate to severe mitral valve regurgitation. No evidence of mitral valve stenosis. Tricuspid Valve: The tricuspid valve is normal in structure. Tricuspid valve regurgitation is mild. Aortic Valve: The aortic valve is tricuspid. Aortic valve regurgitation is mild. Moderate aortic stenosis is present. Aortic valve mean gradient measures 22.0 mmHg. Aortic valve peak gradient measures 34.9 mmHg. Aortic valve area, by VTI measures 1.08 cm. Pulmonic Valve: The pulmonic valve was normal in structure. Pulmonic valve regurgitation is not visualized. Aorta: The aortic root is normal in size and structure and aortic dilatation noted. There is mild dilatation of the ascending aorta measuring 42 mm. Venous: The inferior vena cava is dilated in size with greater than 50% respiratory variability, suggesting right atrial pressure of 8 mmHg. IAS/Shunts: No atrial level shunt detected by color flow Doppler.  LEFT VENTRICLE PLAX 2D LVIDd:         6.50 cm      Diastology LVIDs:         6.20 cm      LV e' lateral:   6.53 cm/s LV PW:          1.00 cm      LV E/e' lateral: 25.1 LV IVS:        0.90 cm      LV e' medial:    3.16 cm/s LVOT diam:     2.20 cm      LV E/e' medial:  51.9 LV SV:         76 LV SV Index:   35 LVOT Area:     3.80 cm  LV Volumes (MOD) LV vol d, MOD A2C: 114.0 ml LV vol d, MOD A4C: 134.0 ml LV vol s, MOD A2C: 66.5 ml LV vol s, MOD A4C: 110.0 ml LV SV MOD A2C:     47.5 ml LV SV MOD A4C:     134.0 ml LV SV MOD BP:      35.4 ml RIGHT VENTRICLE RV S prime:     6.66 cm/s TAPSE (M-mode): 1.3 cm LEFT ATRIUM             Index       RIGHT ATRIUM           Index LA diam:        5.70 cm 2.67 cm/m  RA Area:     13.40 cm LA Vol (A2C):   60.5 ml 28.31 ml/m  RA Volume:   29.50 ml  13.80 ml/m LA Vol (A4C):   91.3 ml 42.72 ml/m LA Biplane Vol: 75.5 ml 35.32 ml/m  AORTIC VALVE AV Area (Vmax):    1.15 cm AV Area (Vmean):   1.19 cm AV Area (VTI):     1.08 cm AV Vmax:           295.33 cm/s AV Vmean:          217.333 cm/s AV VTI:            0.699 m AV Peak Grad:      34.9 mmHg AV Mean Grad:      22.0 mmHg LVOT Vmax:         89.20 cm/s LVOT Vmean:        67.800 cm/s LVOT VTI:          0.199 m LVOT/AV VTI ratio: 0.28  AORTA Ao Root diam: 3.60 cm Ao Asc diam:  4.20 cm MITRAL VALVE                 TRICUSPID VALVE MV Area (PHT): 2.82 cm      TR Peak grad:   29.4 mmHg MV Decel Time: 269 msec      TR Vmax:        271.00 cm/s MR Peak grad:    114.9 mmHg MR Mean grad:    69.0 mmHg   SHUNTS MR Vmax:         536.00 cm/s Systemic VTI:  0.20 m MR Vmean:        391.0 cm/s  Systemic Diam: 2.20 cm MR PISA:         0.57 cm MR PISA Eff ROA: 4 mm MR PISA Radius:  0.30 cm MV E velocity: 164.00 cm/s MV A velocity: 103.00 cm/s MV E/A ratio:  1.59 Loralie Champagne MD Electronically signed by Loralie Champagne MD Signature Date/Time: 03/22/2020/4:19:53 PM    Final     Microbiology: Recent Results (from the past 240 hour(s))  SARS Coronavirus 2 by RT PCR (hospital order, performed in [place] hospital lab) Nasopharyngeal Nasopharyngeal Swab     Status: None    Collection Time: 04/09/20 11:12 AM   Specimen: Nasopharyngeal Swab  Result Value Ref Range Status   SARS Coronavirus 2 NEGATIVE NEGATIVE Final    Comment: (NOTE) SARS-CoV-2 target nucleic acids are NOT DETECTED.  The SARS-CoV-2 RNA is generally detectable in upper and lower respiratory specimens during the acute phase of infection. The lowest concentration of SARS-CoV-2 viral copies this assay can detect is 250 copies / mL. A negative result does not preclude SARS-CoV-2 infection and should not be used as the sole basis for treatment or other patient management decisions.  A negative result may occur with improper specimen collection / handling, submission of specimen other than nasopharyngeal swab, presence of viral mutation(s) within the areas targeted by this assay, and inadequate number of viral copies (<250 copies / mL). A negative result must be combined with clinical observations, patient history, and epidemiological information.  Fact Sheet for Patients:   StrictlyIdeas.no  Fact Sheet for Healthcare Providers: BankingDealers.co.za  This test is not yet approved or  cleared by the [place] FDA and has been authorized for detection and/or diagnosis of SARS-CoV-2 by FDA under an Emergency Use Authorization (EUA).  This EUA will remain in effect (meaning this test can be used) for the duration of the COVID-19 declaration under Section 564(b)(1) of the Act, 21 U.S.C. section 360bbb-3(b)(1), unless  the authorization is terminated or revoked sooner.  Performed at [facility], [address] [place], [place] [address]      Labs: Basic Metabolic Panel: Recent Labs  Lab 04/09/20 [phone] 04/10/20 0501 04/11/20 0903 04/12/20 0520  NA 135 134* 136 136  K 4.7 4.3 3.8 3.7  CL 97* 96* 98 98  CO2 22 25 24 25   GLUCOSE 252* 189* 138* 120*  BUN 94* 100* 95* 90*  CREATININE 3.99* 4.03* 3.87* 3.89*  CALCIUM 8.5* 8.7*  8.3* 8.2*   Liver Function Tests: No results for input(s): AST, ALT, ALKPHOS, BILITOT, PROT, ALBUMIN in the last 168 hours. No results for input(s): LIPASE, AMYLASE in the last 168 hours. No results for input(s): AMMONIA in the last 168 hours. CBC: Recent Labs  Lab 04/09/20 0916 04/09/20 0916 04/09/20 1607 04/10/20 0501 04/10/20 1603 04/11/20 0903 04/12/20 0520  WBC 10.3  --   --   --  8.7 7.0 6.8  HGB 6.5*   < > 7.6* 8.0* 7.7* 8.7* 8.7*  HCT 19.7*   < > 23.1* 24.3* 23.3* 25.4* 25.9*  MCV 97.5  --   --   --  93.6 88.8 90.2  PLT 162  --   --   --  160 163 171   < > = values in this interval not displayed.   Cardiac Enzymes: No results for input(s): CKTOTAL, CKMB, CKMBINDEX, TROPONINI in the last 168 hours. BNP: BNP (last 3 results) Recent Labs    02/13/20 0033 03/20/20 0620  BNP 713.3* 503.2*    ProBNP (last 3 results) No results for input(s): PROBNP in the last 8760 hours.  CBG: Recent Labs  Lab 04/11/20 2047 04/12/20 0008 04/12/20 0405 04/12/20 0828 04/12/20 1204  GLUCAP 115* 145* 102* 126* 109*       Signed:  Desma Maxim MD.  [facility] 04/12/2020, 12:45 PM

## 2020-04-12 NOTE — Telephone Encounter (Signed)
TCM- currently admitted. Call 04/13/20.

## 2020-04-12 NOTE — Progress Notes (Addendum)
[facility]  ROUNDING NOTE   Subjective:   Endoscopy yesterday. No source of bleeding found.  Patient states he wants ot go home.    Objective:  Vital signs in last 24 hours:  Temp:  [97.4 F (36.3 C)-98.2 F (36.8 C)] 97.7 F (36.5 C) (09/21 1204) Pulse Rate:  [44-95] 74 (09/21 1204) Resp:  [18-20] 19 (09/21 1204) BP: (116-142)/(61-99) 117/99 (09/21 1204) SpO2:  [92 %-100 %] 98 % (09/21 1204) Weight:  [102.2 kg] 102.2 kg (09/21 0248)  Weight change: -0.862 kg Filed Weights   04/10/20 1542 04/11/20 0423 04/12/20 0248  Weight: 103.1 kg 103.6 kg 102.2 kg    Intake/Output: I/O last 3 completed shifts: In: 640 [I.V.:300; Blood:340] Out: -    Intake/Output this shift:  Total I/O In: 480 [P.O.:480] Out: -   Physical Exam: General: NAD, laying in bed  Head: Normocephalic, atraumatic. Moist oral mucosal membranes  Eyes: Anicteric, PERRL  Neck: Supple, trachea midline  Lungs:  Clear to auscultation  Heart: Regular rate and rhythm  Abdomen:  Soft, nontender,   Extremities:  no peripheral edema.  Neurologic: Nonfocal, moving all four extremities  Skin: No lesions  Access: none    Basic Metabolic Panel: Recent Labs  Lab 04/09/20 0916 04/09/20 0916 04/10/20 0501 04/11/20 0903 04/12/20 0520  NA 135  --  134* 136 136  K 4.7  --  4.3 3.8 3.7  CL 97*  --  96* 98 98  CO2 22  --  25 24 25   GLUCOSE 252*  --  189* 138* 120*  BUN 94*  --  100* 95* 90*  CREATININE 3.99*  --  4.03* 3.87* 3.89*  CALCIUM 8.5*   < > 8.7* 8.3* 8.2*   < > = values in this interval not displayed.    Liver Function Tests: No results for input(s): AST, ALT, ALKPHOS, BILITOT, PROT, ALBUMIN in the last 168 hours. No results for input(s): LIPASE, AMYLASE in the last 168 hours. No results for input(s): AMMONIA in the last 168 hours.  CBC: Recent Labs  Lab 04/09/20 0916 04/09/20 0916 04/09/20 1607 04/10/20 0501 04/10/20 1603 04/11/20 0903 04/12/20 0520  WBC 10.3  --   --    --  8.7 7.0 6.8  HGB 6.5*   < > 7.6* 8.0* 7.7* 8.7* 8.7*  HCT 19.7*   < > 23.1* 24.3* 23.3* 25.4* 25.9*  MCV 97.5  --   --   --  93.6 88.8 90.2  PLT 162  --   --   --  160 163 171   < > = values in this interval not displayed.    Cardiac Enzymes: No results for input(s): CKTOTAL, CKMB, CKMBINDEX, TROPONINI in the last 168 hours.  BNP: Invalid input(s): POCBNP  CBG: Recent Labs  Lab 04/11/20 2047 04/12/20 0008 04/12/20 0405 04/12/20 0828 04/12/20 1204  GLUCAP 115* 145* 102* 126* 109*    Microbiology: Results for orders placed or performed during the hospital encounter of 04/09/20  SARS Coronavirus 2 by RT PCR (hospital order, performed in [facility] hospital lab) Nasopharyngeal Nasopharyngeal Swab     Status: None   Collection Time: 04/09/20 11:12 AM   Specimen: Nasopharyngeal Swab  Result Value Ref Range Status   SARS Coronavirus 2 NEGATIVE NEGATIVE Final    Comment: (NOTE) SARS-CoV-2 target nucleic acids are NOT DETECTED.  The SARS-CoV-2 RNA is generally detectable in upper and lower respiratory specimens during the acute phase of infection. The lowest concentration of SARS-CoV-2 viral  copies this assay can detect is 250 copies / mL. A negative result does not preclude SARS-CoV-2 infection and should not be used as the sole basis for treatment or other patient management decisions.  A negative result may occur with improper specimen collection / handling, submission of specimen other than nasopharyngeal swab, presence of viral mutation(s) within the areas targeted by this assay, and inadequate number of viral copies (<250 copies / mL). A negative result must be combined with clinical observations, patient history, and epidemiological information.  Fact Sheet for Patients:   StrictlyIdeas.no  Fact Sheet for Healthcare Providers: BankingDealers.co.za  This test is not yet approved or  cleared by the [place] FDA  and has been authorized for detection and/or diagnosis of SARS-CoV-2 by FDA under an Emergency Use Authorization (EUA).  This EUA will remain in effect (meaning this test can be used) for the duration of the COVID-19 declaration under Section 564(b)(1) of the Act, 21 U.S.C. section 360bbb-3(b)(1), unless the authorization is terminated or revoked sooner.  Performed at [facility], [address] [place], [place] [address]     Coagulation Studies: No results for input(s): LABPROT, INR in the last 72 hours.  Urinalysis: No results for input(s): COLORURINE, LABSPEC, PHURINE, GLUCOSEU, HGBUR, BILIRUBINUR, KETONESUR, PROTEINUR, UROBILINOGEN, NITRITE, LEUKOCYTESUR in the last 72 hours.  Invalid input(s): APPERANCEUR    Imaging: No results found.   Medications:   . sodium chloride 30 mL/hr at 04/09/20 1656   . sodium chloride   Intravenous Once  . calcitRIOL  0.25 mcg Oral Daily  . carvedilol  12.5 mg Oral BID WC  . clopidogrel  75 mg Oral Daily  . finasteride  5 mg Oral Daily  . insulin aspart  0-15 Units Subcutaneous Q4H  . isosorbide mononitrate  30 mg Oral Daily  . pantoprazole (PROTONIX) IV  40 mg Intravenous Q12H  . rosuvastatin  10 mg Oral Daily  . tamsulosin  0.4 mg Oral Daily  . torsemide  40 mg Oral Daily   cyclobenzaprine, hydroxypropyl methylcellulose / hypromellose, ipratropium-albuterol, ondansetron **OR** ondansetron (ZOFRAN) IV  Assessment/ Plan:  Mr. CAROLE DEERE is a 64 y.o. white male with peripheral vascular disease, diabetes mellitus type 2 insulin dependent, hypertension, major depressive disorder, tobacco use, psoriasis, diabetic neuropathy, splenectomy, carotid artery stenosis, coronary artery disease, hyperlipidemia, obstructive sleep apnea, history of DVT, CVA admitted to [facility] on 04/09/2020 for Acute blood loss anemia [D62] Atypical chest pain [R07.89] Symptomatic anemia [D64.9]  1. Acute renal failure on chronic kidney disease stage  IV with proteinuria: Creatinine may be at his new baseline.  Recent acute renal failure from IV contrast nephropathy.  Now acute renal failure from acute blood loss anemia.  Chronic kidney disease secondary to diabetic nephropathy, NSAID induced nephropathy and hypertensive nephrosclerosis. No acute indication for dialysis.  - holding losartan - Patient will need dialysis access prior to TAVR. Even if this is a tunneled catheter. Patient needs close follow up with nephrology and vascular surgery.  - Patient and I discussed the risk of contrast induced nephropathy and the risk of requiring dialysis after contrast induced nephropathy and that dialysis may be permanent. Patient states understanding and willing to proceed with plan of care.  2. Hypertension:  - torsemide, isosorbide mononitrate, hydralazine, carvedilol, tamsulosin   3. Diabetes Mellitus type II Insulin Dependent with chronic kidney disease: insulin dependent. hemoglobin A1c 5.7% on 02/13/20  4. Secondary Hyperparathyroidism: PTH elevated at 133. Calcium and phosphorus at goal.  Not currently on  a phosphorus binder.  - Continue calcitriol  5. Anemia with chronic kidney disease: and now with GI bleed Status post PRBC transfusion on admission - Appreciate cardiology and gastroenterology input.    LOS: 3 Empress Newmann 9/21/20211:14 PM

## 2020-04-12 NOTE — Progress Notes (Signed)
Called in prednisone to pharmacy for CT scans and contrast allergy.   Angelena Form PA-C  [facility]

## 2020-04-12 NOTE — Progress Notes (Signed)
Progress Note  Patient Name: [name] [name] Date of Encounter: 04/12/2020  Primary Cardiologist: [name], MD  Subjective   Complains of arthralgias which he attributes to the weather outside.  He denies any chest pain or dyspnea.  EGD on 9/20 showed no evidence of active bleeding.  Documented weight of 102.2 kg is down from his last office weight of 106.4 kg.  Needs gated CTA as part of TAVR work-up which cannot be done in the hospital or at the outpatient imaging center in [place].  Inpatient Medications    Scheduled Meds: . sodium chloride   Intravenous Once  . calcitRIOL  0.25 mcg Oral Daily  . carvedilol  12.5 mg Oral BID WC  . clopidogrel  75 mg Oral Daily  . finasteride  5 mg Oral Daily  . insulin aspart  0-15 Units Subcutaneous Q4H  . isosorbide mononitrate  30 mg Oral Daily  . pantoprazole (PROTONIX) IV  40 mg Intravenous Q12H  . rosuvastatin  10 mg Oral Daily  . tamsulosin  0.4 mg Oral Daily  . torsemide  40 mg Oral Daily   Continuous Infusions: . sodium chloride 30 mL/hr at 04/09/20 1656   PRN Meds: cyclobenzaprine, hydroxypropyl methylcellulose / hypromellose, ipratropium-albuterol, ondansetron **OR** ondansetron (ZOFRAN) IV   Vital Signs    Vitals:   04/11/20 1720 04/11/20 1957 04/12/20 0248 04/12/20 0826  BP: 131/83 116/70 121/82 131/61  Pulse: 95 85 88 88  Resp:  20 18 19   Temp:  98.1 F (36.7 C) 98.2 F (36.8 C) 97.8 F (36.6 C)  TempSrc:  Oral Oral Oral  SpO2:  100% 98% 98%  Weight:   102.2 kg   Height:        Intake/Output Summary (Last 24 hours) at 04/12/2020 0955 Last data filed at 04/11/2020 1504 Gross per 24 hour  Intake 300 ml  Output --  Net 300 ml   Filed Weights   04/10/20 1542 04/11/20 0423 04/12/20 0248  Weight: 103.1 kg 103.6 kg 102.2 kg    Physical Exam   GEN: Well nourished, well developed, in no acute distress.  HEENT: Grossly normal.  Neck: Supple, no JVD, carotid bruits, or masses. Cardiac: RRR, III/VI  harsh systolic murmur loudest at 2nd right ICS. Radials/DP/PT 2+ and equal bilaterally. 1+ edema bilateral lower extremities.  Respiratory:  Respirations regular and unlabored, breath sound decreased at bases. GI: Soft, nontender, nondistended, BS + x 4. MS: no deformity or atrophy. Skin: warm and dry, no rash. Neuro:  Strength and sensation are intact. Psych: AAOx3.  Normal affect.  Labs    Chemistry Recent Labs  Lab 04/10/20 0501 04/11/20 0903 04/12/20 0520  NA 134* 136 136  K 4.3 3.8 3.7  CL 96* 98 98  CO2 25 24 25   GLUCOSE 189* 138* 120*  BUN 100* 95* 90*  CREATININE 4.03* 3.87* 3.89*  CALCIUM 8.7* 8.3* 8.2*  GFRNONAA 14* 15* 15*  GFRAA 17* 18* 18*  ANIONGAP 13 14 13      Hematology Recent Labs  Lab 04/10/20 1603 04/11/20 0903 04/12/20 0520  WBC 8.7 7.0 6.8  RBC 2.49* 2.86* 2.87*  HGB 7.7* 8.7* 8.7*  HCT 23.3* 25.4* 25.9*  MCV 93.6 88.8 90.2  MCH 30.9 30.4 30.3  MCHC 33.0 34.3 33.6  RDW 17.2* 19.2* 19.0*  PLT 160 163 171    Cardiac Enzymes  Recent Labs  Lab 04/09/20 0916 04/09/20 1115 04/09/20 1428 04/09/20 1607 04/11/20 0903  TROPONINIHS 129* 223* 402* 604* 1,471*  BNPNo results for input(s): BNP, PROBNP in the last 168 hours.   DDimer No results for input(s): DDIMER in the last 168 hours.   Lipids  Lab Results  Component Value Date   CHOL 122 02/13/2020   HDL 33 (L) 02/13/2020   LDLCALC 56 02/13/2020   LDLDIRECT 61.0 03/15/2017   TRIG 163 (H) 02/13/2020   CHOLHDL 3.7 02/13/2020    HbA1c  Lab Results  Component Value Date   HGBA1C 5.7 (H) 02/13/2020    Radiology    DG Chest 2 View  Result Date: 04/09/2020 IMPRESSION: 1. Coarsened lung markings have improved but persists consistent with bronchitic change versus atypical infection. Pulmonary venous congestion possible. 2. More focal opacities in the bases are mild and may simply represent atelectasis. Early infiltrates possible. Recommend attention on follow-up. Electronically  Signed   By: Dorise Bullion III M.D   On: 04/09/2020 10:18    Telemetry    Sinus rhythm  - Personally Reviewed  ECG    No new tracings. - Personally Reviewed  Cardiac Studies   Echo 03/22/2020 1. Left ventricular ejection fraction, by estimation, is 30-35%. The left  ventricle has moderately decreased function. The left ventricle  demonstrates regional wall motion abnormalities, basal to mid inferior and  inferoseptal akinesis, basal to mid  inferolateral severe hypokinesis. The left ventricular internal cavity  size was mildly dilated. There is mild left ventricular hypertrophy. Left  ventricular diastolic parameters are consistent with Grade II diastolic  dysfunction (pseudonormalization).  2. Right ventricular systolic function is moderately reduced. The right  ventricular size is normal. There is mildly elevated pulmonary artery  systolic pressure. The estimated right ventricular systolic pressure is  09.4 mmHg.  3. Left atrial size was moderately dilated.  4. The mitral valve is degenerative. Moderate to severe mitral valve  regurgitation. No evidence of mitral stenosis.  5. The aortic valve is tricuspid. Aortic valve regurgitation is mild.  Moderate aortic valve stenosis. Aortic valve area, by VTI measures 1.08  cm. Aortic valve mean gradient measures 22.0 mmHg.  6. Aortic dilatation noted. There is mild dilatation of the ascending  aorta measuring 42 mm.  7. The inferior vena cava is dilated in size with >50% respiratory  variability, suggesting right atrial pressure of 8 mmHg.   Cardiac Cath 03/23/2020  Ost RCA to Prox RCA lesion is 99% stenosed.  Post intervention, there is a 0% residual stenosis.  A drug-eluting stent was successfully placed using a STENT RESOLUTE ONYX 4.0X18.   Patient Profile     64 y.o. male who was admitted to the hospital for acute GI bleed, chest pain, and minimally elevated troponin.   Assessment & Plan    1. Chest pain,  minimally elevated troponins:  -No chest pain since undergoing pRBC transfusion -HS-Tn peaked at 1471 and has been felt to be secondary to supply demand ischemia in the setting of underlying severe multivessel CAD, moderate to severe AS, worsening anemia of GI bleed, and ESRD -Recent LHC and PCI/DES to RCA on 03/23/2020 -No plan for ischemic workup at this time -Continue Imdur 30mg  daily  2. 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 CAD s/p CABG in 2012 s/p recent PCI/DES to RCA on 03/23/2020: -No angina  -ASA held  with worsening anemia/GI bleed, this will continue to be held -Continue Plavix 75mg , rosuvastatin 10mg , Imdur 30mg  daily   4. HFrEF secondary to ICM, EF 30-35%:  -NYHA class III-IV symptoms -Suspect he continues to be mildly volume up, though body habitus makes this difficult  -Continue coreg 12.5mg  BID and Imdur -Consider addition of hydralazine 10 mg tid  -Torsemide 40 mg daily -Not on ACEi/ARB/ARNI/MRA secondary to CKD -Strict I/O -Daily weights -CHF education   5. AKI with CKD stage IV:  -Followed by nephrology with no acute indication for dialysis  -Continued outpatient workup of his AS has been limited by renal function status -Vein mapping with Dr. Ronalee Belts on 04/21/20 prior to obtaining vascular access  -Continue to monitor renal function   6. Moderate to severe aortic stenosis:  -Echo from 8/32021 reveals moderate AS with aortic valve area 1.08 cm, aortic valve mean gradient measures 22.0 mmHg -Cannot exclude low flow low gradient AS -Unable to perform gated cardiac CT as part of TAVR work-up inpatient or at the outpatient imaging center in [place].  This will need to be done at [facility] as an outpatient with  close monitoring of his renal function thereafter.  This information will be coordinated with his nephrologist -Follow up with structural heart team  7. Hypertension:  -He reports some occasional orthostatic lightheadedness which is unchanged from his baseline -BP has been well controlled -Continue regimen as above  8. HLD:  -LDL 56 -Crestor 10mg    From a cardiac perspective, he could be discharged today with plans for pre-TAVR gated CT, which we will coordinate, followed by close monitoring of his renal function which will be coordinated with his nephrologist thereafter.  Recommend follow-up with his primary cardiologist 1 week after discharge.  We will coordinate this.  Signed, Christell Faith, PA-C  04/12/2020, 9:55 AM    For questions or updates, please contact   Please consult [email or web address] for contact info under Cardiology/STEMI.    Patient was discussed on rounds and examined. Changes made above as indicated.   Christell Faith, PA-C 04/12/2020 9:55 AM

## 2020-04-12 NOTE — Telephone Encounter (Signed)
TCM....  Patient is being discharged      They are scheduled to see Arida 9/28 at 340 pm      They need to be seen within 7-10 days

## 2020-04-12 NOTE — Telephone Encounter (Signed)
-----   Message from [name], [place] sent at 04/12/2020 10:05 AM EDT ----- Kidney please schedule patient to see Dr. Fletcher Anon for TCM follow-up in 1 week.  Very complex case.

## 2020-04-13 ENCOUNTER — Encounter: Payer: Medicare Other | Admitting: Internal Medicine

## 2020-04-13 ENCOUNTER — Other Ambulatory Visit: Payer: Self-pay | Admitting: *Deleted

## 2020-04-13 NOTE — Telephone Encounter (Signed)
TOC- 1st attempt. lmtcb  

## 2020-04-13 NOTE — Patient Outreach (Signed)
[facility]) Care Management  04/13/2020  [name] [name] [birth year] [id number]   Telephone Assessment-post hospital discharge  RN attempted outreach call however unsuccessful. RN able to leave a HIPAA approved voice message requesting a call back.  Plan: Will reschedule another outreach call of inquired on pt's ongoing recovery over by next week.  Raina Mina, RN Care Management Coordinator [place] [facility] [phone]

## 2020-04-14 ENCOUNTER — Ambulatory Visit: Payer: Medicare Other | Admitting: *Deleted

## 2020-04-14 ENCOUNTER — Telehealth: Payer: Self-pay

## 2020-04-14 NOTE — Telephone Encounter (Signed)
Patient shared with SW that he has some MD appts coming between tomorrow and Tuesday and prefers SW to outreach him again on Tuesday to schedule palliative care visit.

## 2020-04-15 ENCOUNTER — Encounter: Payer: Self-pay | Admitting: Internal Medicine

## 2020-04-15 ENCOUNTER — Other Ambulatory Visit: Payer: Self-pay

## 2020-04-15 ENCOUNTER — Ambulatory Visit (INDEPENDENT_AMBULATORY_CARE_PROVIDER_SITE_OTHER): Payer: Medicare Other | Admitting: Internal Medicine

## 2020-04-15 VITALS — BP 110/70 | HR 79 | Temp 97.6°F | Wt 226.2 lb

## 2020-04-15 DIAGNOSIS — Z23 Encounter for immunization: Secondary | ICD-10-CM | POA: Diagnosis not present

## 2020-04-15 DIAGNOSIS — E1122 Type 2 diabetes mellitus with diabetic chronic kidney disease: Secondary | ICD-10-CM | POA: Diagnosis not present

## 2020-04-15 DIAGNOSIS — D62 Acute posthemorrhagic anemia: Secondary | ICD-10-CM

## 2020-04-15 DIAGNOSIS — I200 Unstable angina: Secondary | ICD-10-CM

## 2020-04-15 DIAGNOSIS — I350 Nonrheumatic aortic (valve) stenosis: Secondary | ICD-10-CM | POA: Diagnosis not present

## 2020-04-15 DIAGNOSIS — I5042 Chronic combined systolic (congestive) and diastolic (congestive) heart failure: Secondary | ICD-10-CM

## 2020-04-15 DIAGNOSIS — I25119 Atherosclerotic heart disease of native coronary artery with unspecified angina pectoris: Secondary | ICD-10-CM | POA: Diagnosis not present

## 2020-04-15 DIAGNOSIS — N184 Chronic kidney disease, stage 4 (severe): Secondary | ICD-10-CM

## 2020-04-15 MED ORDER — CYCLOBENZAPRINE HCL 10 MG PO TABS: 10.0000 mg | ORAL_TABLET | Freq: Three times a day (TID) | ORAL | 0 refills | Status: DC | PRN

## 2020-04-15 NOTE — Assessment & Plan Note (Signed)
Chronic diastolic failure and now with low EF as well Weight stable now on torsemide 40mg  bid Also isosorbide/hydralazine/carvedillol

## 2020-04-15 NOTE — Assessment & Plan Note (Signed)
Recent demand ischemia due to anemia Stable DOE now Is on isosorbide Plavix only now since apparent GI bleed and anemia (ASA stopped)

## 2020-04-15 NOTE — Assessment & Plan Note (Signed)
Got transfused Will need outpatient colonosocopy====has GI follow up planned

## 2020-04-15 NOTE — Assessment & Plan Note (Signed)
Moving ahead towards TAVR

## 2020-04-15 NOTE — Assessment & Plan Note (Signed)
Getting evaluation for dialysis access This should enable further Rx--like TAVR, etc

## 2020-04-15 NOTE — Progress Notes (Signed)
Subjective:    Patient ID: [name] [name], male    DOB: 1955/11/03, 64 y.o.   MRN: [id number]  HPI Here for hospital follow up This visit occurred during the SARS-CoV-2 public health emergency.  Safety protocols were in place, including screening questions prior to the visit, additional usage of staff PPE, and extensive cleaning of exam room while observing appropriate contact time as indicated for disinfecting solutions.  Reviewed all hospital records and discharge summary  He was not home for long---then got severe chest pain and trouble breathing Called 911 Small bump in troponin--felt to be demand ischemia Worsening of EF--now low Had passed melanotic stool--- hemoglobin down to 6.5 Given transfusions Got IV diuresis--which did help his breathing some NO GI work up due to instability  ASA stopped and now just on the plavix--also on bid PPI  Aortic stenosis is severe Has CT angiogram scheduled next week---needs prednisone pretreatment before the test Hoping to proceed with TAVR  Is due to see Dr Ronalee Belts for evaluation for dialysis access Dr [name] is managing all this  Breathing is better now No chest pain now No dizziness or syncope Chronic stable edema Stable DOE---is able to do some shopping and light housework  Current Outpatient Medications on File Prior to Visit  Medication Sig Dispense Refill  . albuterol (PROAIR HFA) 108 (90 Base) MCG/ACT inhaler Inhale 2 puffs into the lungs every 6 (six) hours as needed for shortness of breath. 18 g 3  . busPIRone (BUSPAR) 10 MG tablet TAKE 2 TABLETS(20 MG) BY MOUTH TWICE DAILY (Patient taking differently: Take 20 mg by mouth 2 (two) times daily. ) 360 tablet 3  . calcitRIOL (ROCALTROL) 0.25 MCG capsule Take 0.25 mcg by mouth every Monday, Wednesday, and Friday. Monday then Wednesday then Friday    . carvedilol (COREG) 12.5 MG tablet Take 1 tablet (12.5 mg total) by mouth 2 (two) times daily. (Patient taking differently: Take  12.5 mg by mouth 2 (two) times daily with a meal. ) 180 tablet 1  . clopidogrel (PLAVIX) 75 MG tablet Take 1 tablet (75 mg total) by mouth daily. 30 tablet 11  . cyclobenzaprine (FLEXERIL) 10 MG tablet Take 10 mg by mouth 3 (three) times daily as needed for muscle spasms.     . Dextran 70-Hypromellose (ARTIFICIAL TEARS) 0.1-0.3 % SOLN Place 1 drop into both eyes 4 (four) times daily as needed (dry eyes).    . finasteride (PROSCAR) 5 MG tablet Take 5 mg by mouth daily.    . hydrALAZINE (APRESOLINE) 25 MG tablet Take 1 tablet (25 mg total) by mouth every 8 (eight) hours. 90 tablet 2  . insulin aspart (NOVOLOG) 100 UNIT/ML injection Inject 0-9 Units into the skin 3 (three) times daily with meals. 10 mL 11  . isosorbide mononitrate (IMDUR) 30 MG 24 hr tablet Take 3 tablets (90 mg total) by mouth daily. 90 tablet 2  . nitroGLYCERIN (NITROSTAT) 0.4 MG SL tablet Place 1 tablet (0.4 mg total) under the tongue every 5 (five) minutes as needed. Maximum of 3 doses. (Patient taking differently: Place 0.4 mg under the tongue every 5 (five) minutes as needed for chest pain. Maximum of 3 doses.) 25 tablet 2  . predniSONE (DELTASONE) 50 MG tablet Take 1 tablet (50 mg total) by mouth as directed. 3 tablet 0  . rosuvastatin (CRESTOR) 10 MG tablet Take 1 tablet (10 mg total) by mouth daily. 30 tablet 2  . sertraline (ZOLOFT) 100 MG tablet Take 1 tablet (100 mg  total) by mouth daily. 90 tablet 3  . tamsulosin (FLOMAX) 0.4 MG CAPS capsule Take 1 capsule by mouth daily (Patient taking differently: Take 0.4 mg by mouth daily. ) 90 capsule 3  . torsemide (DEMADEX) 20 MG tablet Take 2 tablets (40 mg total) by mouth 2 (two) times daily. 360 tablet 1   No current facility-administered medications on file prior to visit.    Allergies  Allergen Reactions  . Contrast Media [Iodinated Diagnostic Agents] Rash and Other (See Comments)    Got very hot and red   . Glipizide Other (See Comments)    ANTIDIABETICS. Burning  .  Hydroxychloroquine Other (See Comments)    Stomach upset  . Metrizamide Other (See Comments)    Got very hot and red  . Penicillins Hives and Swelling    Has patient had a PCN reaction causing immediate rash, facial/tongue/throat swelling, SOB or lightheadedness with hypotension: yes Has patient had a PCN reaction causing severe rash involving mucus membranes or skin necrosis: no  Has patient had a PCN reaction that required hospitalization: yes Has patient had a PCN reaction occurring within the last 10 years: no If all of the above answers are "NO", then may proceed with Cephalosporin use.     Past Medical History:  Diagnosis Date  . 3-vessel CAD    8/30 LHC with radial graft to RCA occluded at the ostium with critical stenosis of the ostial native RCA.  . Adenomatous colon polyp   . Allergy   . Anxiety   . Arthritis    RA  . Bell's palsy 2007  . CAD (coronary artery disease)    03/21/2020 LHC with patent LIMA to LAD and SVG to OM 3.  Radial graft to RCA occluded at the ostium with critical stenosis of the ostial and native RCA.  . Carotid artery occlusion    Bilateral carotid stenosis s/p left-sided CEA.  . Cataract    Dr. Dawna Part  . CKD (chronic kidney disease), stage IV ([place])    03/2020 vascular surgery access planned  . Depression   . Diabetes mellitus   . Diverticulosis   . Duodenitis   . DVT (deep venous thrombosis) ([facility])    in leg  . ESRD (end stage renal disease) ([place])   . Gastropathy   . GERD (gastroesophageal reflux disease)   . Heart failure with preserved ejection fraction ([place])    8/30 RHC with moderately to severely elevated filling pressures and pulmonary wedge pressure 31 mmHg, moderate pulmonary hypertension  . Helicobacter pylori gastritis   . Hx of CABG    2012  . Hyperlipidemia   . Hypertension   . Mitral regurgitation and aortic stenosis    RHC 8/30 and echo 12/2019.  [name] Myocardial infarction ([place])   . Neuropathy   . Obesity   . Peripheral  vascular disease ([place])    S/p extensive revascularization by vascular surgery 05/2019  . Post splenectomy syndrome   . Psoriasis   . Severe aortic stenosis    03/21/20 RHC with peak gradient 27.4 mmHg and valve area 0.87.  [name] Sleep apnea    uses cpap  . Stroke ([place])   . Tobacco use disorder    recently quit  . Tubular adenoma 08/2013   Dr. Hilarie Fredrickson  . Weak urinary stream     Past Surgical History:  Procedure Laterality Date  . CARDIAC CATHETERIZATION  08/2010   LAD: 80% ISR, RCA: 80% ostial, OM 80-90%  . CAROTID ENDARTERECTOMY  08/2010  left/ Dr. Kellie Simmering  . CATARACT EXTRACTION W/PHACO Left 07/09/2017   Procedure: CATARACT EXTRACTION PHACO AND INTRAOCULAR LENS PLACEMENT (IOC);  Surgeon: Birder Robson, MD;  Location: [facility] ORS;  Service: Ophthalmology;  Laterality: Left;  [place] 00:23 AP% 14.2 CDE 3.26 Fluid pack lot # [id number]  . CATARACT EXTRACTION W/PHACO Right 09/10/2017   Procedure: CATARACT EXTRACTION PHACO AND INTRAOCULAR LENS PLACEMENT (IOC);  Surgeon: Birder Robson, MD;  Location: [facility];  Service: Ophthalmology;  Laterality: Right;  [place] 00:26.4 AP% 17.3 CDE 4.59 Fluid Pack Lot # [id number]  . COLONOSCOPY    . CORONARY ANGIOPLASTY     LAD: before CABG  . CORONARY ARTERY BYPASS GRAFT  09/06/2010   At [facility]: LIMA to LAD, left radial to RCA, sequential SVG to OM3 and 4  . CORONARY STENT INTERVENTION N/A 03/23/2020   Procedure: CORONARY STENT INTERVENTION;  Surgeon: Wellington Hampshire, MD;  Location: [place] [facility];  Service: Cardiovascular;  Laterality: N/A;  RCA  . ESOPHAGOGASTRODUODENOSCOPY (EGD) WITH PROPOFOL N/A 04/11/2020   Procedure: ESOPHAGOGASTRODUODENOSCOPY (EGD) WITH PROPOFOL;  Surgeon: Lucilla Lame, MD;  Location: [facility];  Service: Endoscopy;  Laterality: N/A;  . heart stents  Jan 2011   leg stents 06/2009 and 03/2010  . LOWER EXTREMITY ANGIOGRAPHY Left 06/09/2019   Procedure: LOWER EXTREMITY ANGIOGRAPHY;  Surgeon: Katha Cabal, MD;  Location: [place] [facility];  Service: Cardiovascular;  Laterality: Left;  . PTA of illiac and SFA  multiple   Dr. Ronalee Belts, s/p revision 11/2012  . RIGHT AND LEFT HEART CATH N/A 03/21/2020   Procedure: RIGHT AND LEFT HEART CATH possible percutaneous intervention;  Surgeon: Wellington Hampshire, MD;  Location: [place] [facility];  Service: Cardiovascular;  Laterality: N/A;  . RIGHT/LEFT HEART CATH AND CORONARY/GRAFT ANGIOGRAPHY N/A 02/15/2020   Procedure: RIGHT/LEFT HEART CATH AND CORONARY/GRAFT ANGIOGRAPHY;  Surgeon: Wellington Hampshire, MD;  Location: [place] [facility];  Service: Cardiovascular;  Laterality: N/A;  . SPLENECTOMY    . VASCULAR SURGERY     LEG STENTS    Family History  Problem Relation Age of Onset  . Lung cancer Father 60  . Arthritis Father   . Breast cancer Sister 27  . Alcoholism Sister   . Dementia Mother   . Alzheimer's disease Mother   . Arthritis Mother   . Alcoholism Brother   . Epilepsy Sister   . Diabetes Paternal Grandfather   . Colon polyps Paternal Grandfather 63  . Alcoholism Sister   . Alcoholism Sister   . Alcoholism Brother     Social History   Socioeconomic History  . Marital status: Widowed    Spouse name: Not on file  . Number of children: 2  . Years of education: Not on file  . Highest education level: Not on file  Occupational History  . Occupation: Chief Operating Officer at [employer]: Disabled mostly due to neuropathy  Tobacco Use  . Smoking status: Former Smoker    Packs/day: 0.75    Years: 40.00    Pack years: 30.00    Types: Cigarettes    Quit date: 04/15/2017    Years since quitting: 3.0  . Smokeless tobacco: Never Used  . Tobacco comment: 1 cigarette a day  Vaping Use  . Vaping Use: Never used  Substance and Sexual Activity  . Alcohol use: No  . Drug use: No  . Sexual activity: Never  Other Topics Concern  . Not on file  Social History Narrative  Wife died June 2020      Has living will   [name] [name] and his wife [name] [name]  should be his health care POA   Would accept resuscitation --but no prolonged ventilation   No tube feeds if cognitively unaware   Social Determinants of Health   Financial Resource Strain: Low Risk   . Difficulty of Paying Living Expenses: Not very hard  Food Insecurity: No Food Insecurity  . Worried About Charity fundraiser in the Last Year: Never true  . Ran Out of Food in the Last Year: Never true  Transportation Needs: No Transportation Needs  . Lack of Transportation (Medical): No  . Lack of Transportation (Non-Medical): No  Physical Activity:   . Days of Exercise per Week: Not on file  . Minutes of Exercise per Session: Not on file  Stress: No Stress Concern Present  . Feeling of Stress : Only a little  Social Connections: Unknown  . Frequency of Communication with Friends and Family: More than three times a week  . Frequency of Social Gatherings with Friends and Family: Not on file  . Attends Religious Services: Not on file  . Active Member of Clubs or Organizations: Not on file  . Attends Archivist Meetings: Not on file  . Marital Status: Not on file  Intimate Partner Violence: Unknown  . Fear of Current or Ex-Partner: No  . Emotionally Abused: No  . Physically Abused: No  . Sexually Abused: Not on file   Review of Systems Legs and feet can really feel bad and hurt at night---uses the cyclobenzaprine at times Weight is down a few pounds from before the hospitalization Sleep is variable--both bed and chair. Sleeps flat Still uncomfortable since fall when he injured his tailbone (x-ray ?slight fracture)    Objective:   Physical Exam Constitutional:      Appearance: Normal appearance.  Cardiovascular:     Rate and Rhythm: Normal rate and regular rhythm.     Heart sounds: No gallop.      Comments: Loud aortic systolic murmur Pulmonary:     Effort: Pulmonary effort is normal.     Breath sounds: No wheezing or rales.     Comments: Decreased breath  sounds but clear Abdominal:     Palpations: Abdomen is soft.     Tenderness: There is no abdominal tenderness.  Musculoskeletal:     Cervical back: Neck supple.     Right lower leg: No edema.     Left lower leg: No edema.  Lymphadenopathy:     Cervical: No cervical adenopathy.  Neurological:     Mental Status: He is alert.  Psychiatric:        Mood and Affect: Mood normal.        Behavior: Behavior normal.            Assessment & Plan:

## 2020-04-17 ENCOUNTER — Other Ambulatory Visit: Payer: Self-pay | Admitting: Physician Assistant

## 2020-04-18 ENCOUNTER — Ambulatory Visit (HOSPITAL_COMMUNITY): Payer: Medicare Other

## 2020-04-18 ENCOUNTER — Telehealth (HOSPITAL_COMMUNITY): Payer: Self-pay | Admitting: Emergency Medicine

## 2020-04-18 ENCOUNTER — Ambulatory Visit (HOSPITAL_COMMUNITY): Admission: RE | Admit: 2020-04-18 | Payer: Medicare Other | Source: Ambulatory Visit

## 2020-04-18 ENCOUNTER — Telehealth: Payer: Self-pay | Admitting: Physician Assistant

## 2020-04-18 ENCOUNTER — Other Ambulatory Visit: Payer: Self-pay | Admitting: *Deleted

## 2020-04-18 NOTE — Patient Outreach (Signed)
[facility]) Care Management  04/18/2020  [name] [name] 08-Dec-1955 [id number]   Telephone Assessment-unsuccessful  RN 2nd outreach attempted post discharge from the hospital on 9/21. Will send outreach letter and continue attempts to reach pt for ongoing [facility] services.   Plan: Will rescheduled another outreach over the next week.  Raina Mina, RN Care Management Coordinator [place] Office [phone]

## 2020-04-18 NOTE — Telephone Encounter (Signed)
Received message from nurse stating that patient not feeling well and requested to postpone his CTA appt.   Attempted to contact patient but unable to speak with him,  Left message for callback.   Since appt is in 30 mins. Will go ahead and have scheduling r/s test. Will inform the imaging team of his plans.  Marchia Bond RN Navigator Cardiac Imaging [facility] Heart and Vascular Services [phone] Office  [phone] Cell

## 2020-04-18 NOTE — Telephone Encounter (Signed)
Patient contacted regarding discharge from [facility] on 04/12/20.  Patient understands to follow up with provider Dr. Fletcher Anon on 04/19/20 at 3:40pm at the [facility] office. Patient understands discharge instructions? yes Patient understands medications and regiment? yes Patient understands to bring all medications to this visit? yes  Patient sts that he has been out of his torsemide for over a week. He normally get his medications delivered from [facility]. [name], NP did send in a refill for Torsemide 80 mg bid on 04/07/20 with confirmation.  Contacted [facility] and spoke with Amy. Amy sts that they received the e-scribe prescription on 04/07/20 it was placed on hold and she is not sure why. Per Amy she will run it through the patient insurance. They do not have the full 90 day supply on hand. They will deliver a partial amount and the rest later this week.  Patient made aware and voiced appreciation for the assistance.  Patient was scheduled for a cardiac CTA and CTA of the chest/aorta today. He sts that he needs to reschedule, he does not feel well. He sts that he has left a message for Bonney Leitz, [place]. I also sent the message to Marchia Bond, RN.

## 2020-04-18 NOTE — Telephone Encounter (Signed)
Called [facility] to f/u on the patients Torsemide refill delivery. Spoke with Amy. Per Amy the patients Rx for Torsemide was delivered to the patient this morning as discussed.

## 2020-04-18 NOTE — Telephone Encounter (Signed)
  [place] VALVE TEAM  Patient scheduled for pre TAVR CT scans today. He called into cancel given GI bug with high temps. He has tested negative for Covid.  We will move CTs out 1 week to 10/1 @ 10:45am   Angelena Form PA-C  [facility]

## 2020-04-19 ENCOUNTER — Telehealth: Payer: Self-pay | Admitting: Cardiovascular Disease

## 2020-04-19 ENCOUNTER — Ambulatory Visit: Payer: Medicare Other | Admitting: Cardiovascular Disease

## 2020-04-19 ENCOUNTER — Telehealth: Payer: Self-pay

## 2020-04-19 ENCOUNTER — Ambulatory Visit: Payer: Medicare Other | Admitting: Physician Assistant

## 2020-04-19 DIAGNOSIS — L409 Psoriasis, unspecified: Secondary | ICD-10-CM | POA: Diagnosis not present

## 2020-04-19 DIAGNOSIS — Z79899 Other long term (current) drug therapy: Secondary | ICD-10-CM | POA: Diagnosis not present

## 2020-04-19 DIAGNOSIS — M0579 Rheumatoid arthritis with rheumatoid factor of multiple sites without organ or systems involvement: Secondary | ICD-10-CM | POA: Diagnosis not present

## 2020-04-19 DIAGNOSIS — Z111 Encounter for screening for respiratory tuberculosis: Secondary | ICD-10-CM | POA: Diagnosis not present

## 2020-04-19 NOTE — Telephone Encounter (Signed)
Palliative care SW LVM for patient to schedule in home visit. Awaiting return call.

## 2020-04-19 NOTE — Telephone Encounter (Signed)
Can someone please call him in 3 pills of prenisone 50mg  for pre CTs contrast dye allergy - he has the instructions on when to take

## 2020-04-19 NOTE — Telephone Encounter (Signed)
Patient calling back in stating he needs a new Rx for prednisone for upcoming test Monday

## 2020-04-19 NOTE — Telephone Encounter (Signed)
Patient cancelled today's visit due to illness  Attempted to reschedule patient appt to a virtual or offer next week in office with Dr. Fletcher Anon.    Patient declined both. He states he will see the guys at the hospital on Monday .

## 2020-04-19 NOTE — Chronic Care Management (AMB) (Signed)
Chronic Care Management Pharmacy Assistant   Name: CLEVEN JANSMA  MRN: [id number] DOB: [birth year]  Reason for Encounter: Medication Review/ Monthly Dispensing Call  PCP : [name], MD  Allergies:   Allergies  Allergen Reactions  . Contrast Media [Iodinated Diagnostic Agents] Rash and Other (See Comments)    Got very hot and red   . Glipizide Other (See Comments)    ANTIDIABETICS. Burning  . Hydroxychloroquine Other (See Comments)    Stomach upset  . Metrizamide Other (See Comments)    Got very hot and red  . Penicillins Hives and Swelling    Has patient had a PCN reaction causing immediate rash, facial/tongue/throat swelling, SOB or lightheadedness with hypotension: yes Has patient had a PCN reaction causing severe rash involving mucus membranes or skin necrosis: no  Has patient had a PCN reaction that required hospitalization: yes Has patient had a PCN reaction occurring within the last 10 years: no If all of the above answers are "NO", then may proceed with Cephalosporin use.     Medications: Outpatient Encounter Medications as of 04/19/2020  Medication Sig  . albuterol (PROAIR HFA) 108 (90 Base) MCG/ACT inhaler Inhale 2 puffs into the lungs every 6 (six) hours as needed for shortness of breath.  . busPIRone (BUSPAR) 10 MG tablet TAKE 2 TABLETS(20 MG) BY MOUTH TWICE DAILY (Patient taking differently: Take 20 mg by mouth 2 (two) times daily. )  . calcitRIOL (ROCALTROL) 0.25 MCG capsule Take 0.25 mcg by mouth every Monday, Wednesday, and Friday. Monday then Wednesday then Friday  . carvedilol (COREG) 12.5 MG tablet Take 1 tablet (12.5 mg total) by mouth 2 (two) times daily. (Patient taking differently: Take 12.5 mg by mouth 2 (two) times daily with a meal. )  . clopidogrel (PLAVIX) 75 MG tablet Take 1 tablet (75 mg total) by mouth daily.  . cyclobenzaprine (FLEXERIL) 10 MG tablet Take 1 tablet (10 mg total) by mouth 3 (three) times daily as needed for muscle  spasms.  . Dextran 70-Hypromellose (ARTIFICIAL TEARS) 0.1-0.3 % SOLN Place 1 drop into both eyes 4 (four) times daily as needed (dry eyes).  . finasteride (PROSCAR) 5 MG tablet Take 5 mg by mouth daily.  . hydrALAZINE (APRESOLINE) 25 MG tablet Take 1 tablet (25 mg total) by mouth every 8 (eight) hours.  . insulin aspart (NOVOLOG) 100 UNIT/ML injection Inject 0-9 Units into the skin 3 (three) times daily with meals.  . isosorbide mononitrate (IMDUR) 30 MG 24 hr tablet Take 3 tablets (90 mg total) by mouth daily.  . nitroGLYCERIN (NITROSTAT) 0.4 MG SL tablet DISSOLVE 1 TABLET UNDER THE TONGUE EVERY 5 MINUTES AS NEEDED FOR CHEST PAIN. DO NOT EXCEED A TOTAL OF 3 DOSES IN 15 MINUTES.  . rosuvastatin (CRESTOR) 10 MG tablet Take 1 tablet (10 mg total) by mouth daily.  . sertraline (ZOLOFT) 100 MG tablet Take 1 tablet (100 mg total) by mouth daily.  . tamsulosin (FLOMAX) 0.4 MG CAPS capsule Take 1 capsule by mouth daily (Patient taking differently: Take 0.4 mg by mouth daily. )  . torsemide (DEMADEX) 20 MG tablet Take 2 tablets (40 mg total) by mouth 2 (two) times daily.  . [DISCONTINUED] predniSONE (DELTASONE) 50 MG tablet Take 1 tablet (50 mg total) by mouth as directed.   No facility-administered encounter medications on file as of 04/19/2020.    Current Diagnosis: Patient Active Problem List   Diagnosis Date Noted  . Demand ischemia ([place])   . Acute blood  loss anemia 04/09/2020  . Chronic combined systolic and diastolic CHF (congestive heart failure) ([place]) 04/09/2020  . CKD (chronic kidney disease), stage IV ([place]) 04/09/2020  . Depression 04/09/2020  . Aortic stenosis 04/09/2020  . Elevated troponin I level 04/09/2020  . Ischemic cardiomyopathy 03/25/2020  . Non-ST elevation (NSTEMI) myocardial infarction ([place]) 03/23/2020  . CAD (coronary artery disease) 03/22/2020  . Chronic heart failure with preserved ejection fraction (HFpEF) ([place]) 03/21/2020  . Hx of CABG 03/21/2020  . Anemia 03/21/2020   . Unstable angina ([place]) 03/20/2020  . Chronic kidney disease   . History of non-ST elevation myocardial infarction (NSTEMI) 02/13/2020  . Moderate aortic valve stenosis 02/13/2020  . Acute on chronic combined systolic and diastolic CHF (congestive heart failure) ([place]) 02/13/2020  . Hypertensive urgency 02/13/2020  . Fall with injury 11/16/2019  . Leg weakness, bilateral 11/16/2019  . Benign hypertensive kidney disease with chronic kidney disease 09/02/2019  . Proteinuria 09/02/2019  . Secondary hyperparathyroidism of renal origin ([place]) 09/02/2019  . Left arm pain 02/11/2019  . Gastroesophageal reflux 03/25/2018  . Atherosclerosis of aorta ([place]) 09/04/2017  . Thoracic aortic aneurysm ([place]) 09/04/2017  . Right thyroid nodule 04/12/2017  . Thrombocytopenia ([place]) 03/16/2017  . CKD stage 4 due to type 2 diabetes mellitus ([place]) 03/15/2017  . Advance directive discussed with patient 03/15/2017  . BMI 40.0-44.9, adult ([place]) 09/12/2016  . Psoriatic arthritis ([place])   . Lung nodule < 6cm on CT 03/29/2016  . COPD with acute bronchitis ([place]) 03/29/2016  . Headache 09/14/2015  . Constipation 10/05/2014  . PVD (peripheral vascular disease) ([place]) 04/23/2014  . Memory loss 04/23/2014  . Preventative health care 11/23/2013  . Benign prostatic hypertrophy without urinary obstruction 03/25/2013  . Enlarged prostate without lower urinary tract symptoms (luts) 03/25/2013  . Peripheral vascular disease due to secondary diabetes mellitus ([place]) 03/25/2013  . Obesity 02/12/2013  . Atherosclerotic heart disease of native coronary artery with angina pectoris ([place]) 06/06/2012  . DM (diabetes mellitus), type 2 with complications ([place]) 10/93/2355  . Severe aortic stenosis   . Low back pain 12/27/2011  . MDD (major depressive disorder), recurrent episode ([place]) 05/28/2011  . Carotid artery disease ([place]) 04/17/2011  . Occlusion and stenosis of carotid artery 04/17/2011  . Obstructive sleep apnea 03/05/2011    . Psoriasis 03/05/2011  . Type 2 diabetes mellitus with diabetic peripheral angiopathy without gangrene ([place]) 03/05/2011  . Polyneuropathy 03/05/2011  . 3-vessel CAD   . Hyperlipidemia, mixed   . Essential hypertension     Goals Addressed   None    Reviewed chart for medication changes ahead of medication coordination call.  OVs, Consults and Hospital visits:  02/29/20- Darylene Price, FNP(Cardiology); 03/03/20- Palliative Care Home Visit; 03/10/20- Dr [name] (Nephrology); 03/11/20- Dr Idolina Primer (Cardiology);  8/29/21[facility] ED to [facility] Admission/ Discharged 03/21/20. 03/21/20- [facility] (Right and Left heart cath)/Discharged on 03/25/20; 03/29/20- [name], FNP(Cardiology); 04/07/20- Dr. [name] (Nephrology); 04/09/20- [facility] [place] [facility] admission/ discharged 04/12/20. 04/15/20- Dr [name] (PCP); 04/19/20- Dr [name] (Rheumatology)  since last care coordination call/Pharmacist visit with Debbora Dus, CPP.  Medication changes indicated: 02/29/20- MVI and Nystatin powder discontinued. 03/10/20- Torsemide 20 mg 2 tablets once daily started. 03/20/20- Clopidogrel 75 mg stopped. 03/21/20- Amlodipine 2.5 mg, Bupropion 150 mg, Heparin 2500-0.45 infusion, Nitroglycerin 0.2 mg infusion, Prednisone 50 mg and Simvastatin 20 mg stopped. 04/07/20- Carvedilol 12.5 mg directions changed to 2(two) times daily with meal, Insulin changed to 0.9 units sq 3  times daily with meals, Nitroglycerin 0.4 mg directions changed to I sublingual every 5 minutes as needed, max 3 doses, Torsemide changed to 40 mg 2 times daily. 04/09/2020- Aspirin 81 mg stopped. 04/17/20- Nitroglycerin 0.4 mg directions changed to 1 tablet under tongue every 5 minutes as needed for chest pains, do not exceed a total of 3 doses in 15 minutes.  BP Readings from Last 3 Encounters:  04/15/20 110/70  04/12/20 (!) 117/99  04/07/20 120/60    Lab Results  Component Value Date   HGBA1C  5.7 (H) 02/13/2020     Patient obtains medications through Vials  30 Days (Patient will hold on 90 day adherence packaging until health and medications are stable.)  Last adherence delivery included:PATIENT [place] FROM 03/20/2020 TO 04/12/2020.  Medications sent on 03/17/2020: Isosorbide Mononitrate 30 mg- 3 tablets once daily, Bupropion 150 mg- 1 tablet daily, Calcitriol 0.25 mg 1- TAKE ONE CAPSULE BY MOUTH ONCE WEEKLY ON MONDAY and TAKE ONE CAPSULE BY MOUTH ONCE WEEKLY ON WEDNESDAY and TAKE ONE CAPSULE BY MOUTH ONCE WEEKLY ON Friday  Medications sent on  03/28/2020: Carvedilol 12.5 mg- 1 tablet twice daily(breakfast and bedtime), Sertraline 100 mg- 1 tablet daily, Buspirone 1 mg- 2 tablets twice daily, Finasteride 5 mg- 1 tablet daily, Dulcolax 1200 mg- use as directed, Nitroglycerin 0.4 mg- 1 tablet under tongue every 5 minutes as needed for chest pains, not to exceed 3 doses in 15 minutes.   Medication sent on  04/13/2020- Prednisone 50 mg- 1 tablet daily as directed. Medication sent on 04/15/2020- Cyclobenzaprine 10 mg - 1 tablet three times a day as needed for muscle spasms. Medication sent on 04/18/20- Torsemide 20 mg -2 tablets by mouth twice daily  Patient is due for next adherence delivery on: 04/20/2020.  Called patient 04/19/20- no answer, left message to return call. Called patient 04/20/20- no answer, left message to return call- also called daughter in law, no answer, left message to return call.  04/21/2020- Called patient and reviewed medications and coordinated delivery. Patient is not out of medications and would like delivery for 04/26/2020.  This delivery to include:  Carvedilol 12.5 mg- 1 tablet twice daily, Sertraline 100 mg- 1 tablet daily, Buspirone 1 mg- 2 tablets twice daily, Finasteride 5 mg- 1 tablet daily, Nitroglycerin 0.4 mg- 1 tablet under tongue every 5 minutes as needed for chest pains, not to exceed 3 doses in 15 minutes,  Prednisone 50 mg- 1  tablet daily as directed, Rosuvastatin 10 mg- 1 tablet daily, Maalox- Use as directed, Clopidogrel 75 mg- 1 tablet daily, Isosorbide Mononitrate 30 mg- 3 tablets once daily.  Coordinated acute fill for Prednisone 50 mg and Maalox to be delivered 04/21/2020. Patient is having a heart catheterization done on Monday 04/25/2020, procedure was rescheduled from 04/18/20 due to not feeling well.  Patient declined the following medications: Torsemide 20 mg -2 tablets by mouth twice daily(sent 9/27 for a 90 DS); Cyclobenzaprine 10 mg - 1 tablet three times a day as needed for muscle spasms(sent 9/24 for 10 DS use prn); Dulcolax 1200 mg- use as directed( Patient needs Maalox instead); Hydralazine 25 mg- 1 tablet every 8 hours( received a Rx from [facility] 03/25/20 #90, but now he is taking 2 tablets daily, has #30 on hand); Tamsulosin 0.4 mg- 1 tablet daily (still has a #30 on hand, was taking prn).  Will coordinate acute fills for Tamsulosin 0.4 mg for 05/17/2020, Hydralazine 25 mg for 05/03/2020  Will  contact [facility] to transfer Clopidogrel, Hydralazine and Rosuvastatin prescriptions to [facility] for further fills.   Patient needs refills for:  Carvedilol 12.5 mg- 1 tablet twice daily, Sertraline 100 mg- 1 tablet daily, Buspirone 1 mg- 2 tablets twice daily, Finasteride 5 mg- 1 tablet daily, Nitroglycerin 0.4 mg- 1 tablet under tongue every 5 minutes as needed for chest pains, not to exceed 3 doses in 15 minutes,  Prednisone 50 mg- 1 tablet daily as directed, Rosuvastatin 10 mg- 1 tablet daily, Maalox- Use as directed, Clopidogrel 75 mg- 1 tablet daily, Isosorbide Mononitrate 30 mg- 3 tablets once daily.  Confirmed delivery date of 04/26/2020, advised patient that pharmacy will contact them the morning of delivery.  Follow-Up:  Coordination of Enhanced Pharmacy Services and Pharmacist Review  Patient did have packages but would like to get vials for 30 days until his  medications become stable. He has an Heart Catheterization scheduled for Monday to rule out if he need open heart surgery. Patent is not out of medications due to current changes in medication, doses and signatures, he would like delivery for 04/26/2020. Patient mentioned starting Kidney dialysis next week, he also mentioned his weight fluctuating he could weigh in the morning at 229 and in the afternoon he will be around 224, he has changed his diet to eating healthier, but he does not cook much.  Pattricia Boss, [place] Clinical Pharmacist Assistant [phone]   Donette Larry, CPP notified.

## 2020-04-19 NOTE — Telephone Encounter (Signed)
Looks like we sent in a prescription on 04/12/20 for 3 tablets of prednisone. Spoke to [employer] Ian Malkin) who said the prescription was filled and delivered to patient's home on 04/13/20.  Called patient and left message for patient to call me back to confirm if he has the prednisone or not.

## 2020-04-20 ENCOUNTER — Other Ambulatory Visit (INDEPENDENT_AMBULATORY_CARE_PROVIDER_SITE_OTHER): Payer: Self-pay | Admitting: Nurse Practitioner

## 2020-04-20 DIAGNOSIS — N186 End stage renal disease: Secondary | ICD-10-CM

## 2020-04-20 NOTE — Telephone Encounter (Signed)
No answer. Left message to call back.   

## 2020-04-21 ENCOUNTER — Other Ambulatory Visit (INDEPENDENT_AMBULATORY_CARE_PROVIDER_SITE_OTHER): Payer: Medicare Other

## 2020-04-21 ENCOUNTER — Ambulatory Visit (INDEPENDENT_AMBULATORY_CARE_PROVIDER_SITE_OTHER): Payer: Medicare Other | Admitting: Vascular Surgery

## 2020-04-21 ENCOUNTER — Other Ambulatory Visit: Payer: Self-pay | Admitting: *Deleted

## 2020-04-21 ENCOUNTER — Telehealth: Payer: Self-pay | Admitting: Cardiovascular Disease

## 2020-04-21 ENCOUNTER — Encounter (INDEPENDENT_AMBULATORY_CARE_PROVIDER_SITE_OTHER): Payer: Medicare Other

## 2020-04-21 MED ORDER — PREDNISONE 50 MG PO TABS
ORAL_TABLET | ORAL | 0 refills | Status: DC
Start: 2020-04-21 — End: 2020-04-25

## 2020-04-21 NOTE — Patient Outreach (Signed)
[facility]) Care Management  04/21/2020  [name] [name] November 07, 1955 [id number]   Pt was discussed in multidisciplinary case discussion today.  Raina Mina, RN Care Management Coordinator [place] Office [phone]

## 2020-04-21 NOTE — Telephone Encounter (Signed)
Rx sent 

## 2020-04-21 NOTE — Telephone Encounter (Signed)
Pt c/o medication issue:  1. Name of Medication: predniSONE (DELTASONE) 50 MG tablet  2. How are you currently taking this medication (dosage and times per day)? 1 tablet  3. Are you having a reaction (difficulty breathing--STAT)? no  4. What is your medication issue? [facility] states they need a new prescription of the medication, because the patient took it for his CT, but then got sick and had to push the appointment back so he needs the medication again. She states he needs the medication today.

## 2020-04-22 ENCOUNTER — Ambulatory Visit (HOSPITAL_COMMUNITY): Payer: Medicare Other

## 2020-04-22 ENCOUNTER — Telehealth: Payer: Self-pay | Admitting: Physician Assistant

## 2020-04-22 ENCOUNTER — Other Ambulatory Visit: Payer: Self-pay | Admitting: *Deleted

## 2020-04-22 ENCOUNTER — Ambulatory Visit (HOSPITAL_COMMUNITY): Admission: RE | Admit: 2020-04-22 | Payer: Medicare Other | Source: Ambulatory Visit

## 2020-04-22 NOTE — Telephone Encounter (Signed)
Prednisone Rx sent in on 04/21/20 by PA.

## 2020-04-22 NOTE — Patient Outreach (Signed)
[facility]) Care Management  04/22/2020  DAHL HIGINBOTHAM 24-Nov-1955 [id number]  Telephone Assessment-Unsuccessful  RN has attempted several outreach call however remains unsuccessful. RN able to leave a HIPAA approved voice message requesting a call back. Will further engage at that time.  Will follow up once again next month for ongoing [facility] services and await for possible call back. Note no response to the outreach letter sent last month.  Raina Mina, RN Care Management Coordinator [place] Office [phone]

## 2020-04-22 NOTE — Telephone Encounter (Signed)
Okay sounds like a good plan. Thanks MA

## 2020-04-22 NOTE — Telephone Encounter (Signed)
This patient seems to be overwhelmed with everything that is going on with him.  His aortic stenosis was not really critical and I wonder if we just postpone TAVR evaluation for now until things settle down or symptoms worsen.  I also disagree with nephrology about not initiating dialysis before CT scan.  I think that is going to be harmful to him.  I suggested to bring him to see me in few weeks to reevaluate things.

## 2020-04-22 NOTE — Telephone Encounter (Signed)
  [place] VALVE TEAM  Patient did not show up for his pre TAVR CT scans today. Tried calling patient with no answer. Will not try to r/s until patient calls back.    Angelena Form PA-C  [facility]

## 2020-04-25 ENCOUNTER — Other Ambulatory Visit: Payer: Self-pay

## 2020-04-25 ENCOUNTER — Telehealth: Payer: Self-pay | Admitting: Cardiovascular Disease

## 2020-04-25 MED ORDER — PREDNISONE 50 MG PO TABS
ORAL_TABLET | ORAL | 0 refills | Status: DC
Start: 2020-04-25 — End: 2020-05-26

## 2020-04-25 MED ORDER — ROSUVASTATIN CALCIUM 10 MG PO TABS
10.0000 mg | ORAL_TABLET | Freq: Every day | ORAL | 3 refills | Status: DC
Start: 2020-04-25 — End: 2020-06-15

## 2020-04-25 NOTE — Telephone Encounter (Signed)
[place]ellington [place], MD to Eileen Stanford, PA-C . Burnell Blanks, MD . Barkley Boards, RN . Me     12:43 PM Note This patient seems to be overwhelmed with everything that is going on with him.  His aortic stenosis was not really critical and I wonder if we just postpone TAVR evaluation for now until things settle down or symptoms worsen.  I also disagree with nephrology about not initiating dialysis before CT scan.  I think that is going to be harmful to him.  I suggested to bring him to see me in few weeks to reevaluate things.     I do believe that the patients work up for TAVR is being delayed at this time. Will route to Bonney Leitz, [place] to advise.

## 2020-04-25 NOTE — Telephone Encounter (Signed)
Please see note below. 

## 2020-04-25 NOTE — Telephone Encounter (Signed)
Patient thought ct was for today so he took his prednisone.     Patient will need this resent and ct is pending call from [facility] for  rescheduling.

## 2020-04-25 NOTE — Telephone Encounter (Signed)
Noted  

## 2020-04-25 NOTE — Telephone Encounter (Signed)
Thanks Mo.

## 2020-04-25 NOTE — Telephone Encounter (Signed)
Ok. prednisone has been refilled.

## 2020-04-25 NOTE — Telephone Encounter (Signed)
I spoke with the pt and there was mix up with the dates (actually it was my fault) and his no show was because I messed up the dates, not because he was being non compliant. I have rescheduled his TAVR CTs to 10/14. He was appreciative of the call. Lattie Haw, do you mind calling in 3 pills of prednisone for him? Thank you!  KT

## 2020-04-27 ENCOUNTER — Ambulatory Visit (INDEPENDENT_AMBULATORY_CARE_PROVIDER_SITE_OTHER): Payer: Medicare Other

## 2020-04-27 ENCOUNTER — Encounter (INDEPENDENT_AMBULATORY_CARE_PROVIDER_SITE_OTHER): Payer: Self-pay | Admitting: Nurse Practitioner

## 2020-04-27 ENCOUNTER — Ambulatory Visit (INDEPENDENT_AMBULATORY_CARE_PROVIDER_SITE_OTHER): Payer: Medicare Other | Admitting: Nurse Practitioner

## 2020-04-27 ENCOUNTER — Other Ambulatory Visit: Payer: Self-pay

## 2020-04-27 VITALS — BP 152/81 | HR 102 | Ht 66.0 in | Wt 230.0 lb

## 2020-04-27 DIAGNOSIS — E782 Mixed hyperlipidemia: Secondary | ICD-10-CM | POA: Diagnosis not present

## 2020-04-27 DIAGNOSIS — N186 End stage renal disease: Secondary | ICD-10-CM | POA: Diagnosis not present

## 2020-04-27 DIAGNOSIS — N184 Chronic kidney disease, stage 4 (severe): Secondary | ICD-10-CM | POA: Diagnosis not present

## 2020-04-27 DIAGNOSIS — I10 Essential (primary) hypertension: Secondary | ICD-10-CM | POA: Diagnosis not present

## 2020-04-27 DIAGNOSIS — I200 Unstable angina: Secondary | ICD-10-CM

## 2020-04-27 NOTE — H&P (View-Only) (Signed)
Subjective:    Patient ID: [name] [name], male    DOB: 1955/11/20, 64 y.o.   MRN: [id number] Chief Complaint  Patient presents with  . Follow-up    est pt. kolluru. vein mapping     The patient is seen for evaluation for dialysis access. The patient has chronic renal insufficiency stage V secondary to hypertension and diabetes. The patient's most recent creatinine clearance is less than 20. The patient volume status has not yet become an issue. Patient's blood pressures been relatively well controlled. There are mild uremic symptoms which appear to be relatively well tolerated at this time. The patient is right-handed.  The patient has been considering the various methods of dialysis and wishes to proceed with hemodialysis and therefore creation of AV access.  The patient denies amaurosis fugax or recent TIA symptoms. There are no recent neurological changes noted. The patient denies claudication symptoms or rest pain symptoms. The patient denies history of DVT, PE or superficial thrombophlebitis. The patient denies recent episodes of angina or shortness of breath.   Today noninvasive studies show adequate vein diameters for creation of a left brachiocephalic AV fistula. It is also noted that the patient has his left radial artery removed for CABG. But his ulnar and brachial arteries are patent. The right upper extremity he has an occluded distal ulnar artery in the brachial and radial arteries are patent. The patient also has triphasic waveforms in the arteries in his left upper extremity whereas the patient has triphasic/monophasic waveforms in his right upper extremity.   Review of Systems  Musculoskeletal: Positive for gait problem.  Neurological: Positive for numbness.  Hematological: Bruises/bleeds easily.  All other systems reviewed and are negative.      Objective:   Physical Exam Vitals reviewed.  HENT:     Head: Normocephalic.  Cardiovascular:     Rate and Rhythm:  Normal rate and regular rhythm.     Pulses:          Radial pulses are 0 on the left side.  Pulmonary:     Effort: Pulmonary effort is normal.  Neurological:     Mental Status: He is alert and oriented to person, place, and time.     Motor: Weakness present.     Gait: Gait abnormal.  Psychiatric:        Mood and Affect: Mood normal.        Behavior: Behavior normal.        Thought Content: Thought content normal.        Judgment: Judgment normal.     BP (!) 152/81   Pulse (!) 102   Ht 5\' 6"  (1.676 m)   Wt 230 lb (104.3 kg)   BMI 37.12 kg/m   Past Medical History:  Diagnosis Date  . 3-vessel CAD    8/30 LHC with radial graft to RCA occluded at the ostium with critical stenosis of the ostial native RCA.  . Adenomatous colon polyp   . Allergy   . Anxiety   . Arthritis    RA  . Bell's palsy 2007  . CAD (coronary artery disease)    03/21/2020 LHC with patent LIMA to LAD and SVG to OM 3.  Radial graft to RCA occluded at the ostium with critical stenosis of the ostial and native RCA.  . Carotid artery occlusion    Bilateral carotid stenosis s/p left-sided CEA.  . Cataract    Dr. Dawna Part  . CKD (chronic kidney disease), stage IV ([place])  03/2020 vascular surgery access planned  . Depression   . Diabetes mellitus   . Diverticulosis   . Duodenitis   . DVT (deep venous thrombosis) (HCC)    in leg  . ESRD (end stage renal disease) ([place])   . Gastropathy   . GERD (gastroesophageal reflux disease)   . Heart failure with preserved ejection fraction ([place])    8/30 RHC with moderately to severely elevated filling pressures and pulmonary wedge pressure 31 mmHg, moderate pulmonary hypertension  . Helicobacter pylori gastritis   . Hx of CABG    2012  . Hyperlipidemia   . Hypertension   . Mitral regurgitation and aortic stenosis    RHC 8/30 and echo 22-Jan-2020.  [name] Myocardial infarction ([place])   . Neuropathy   . Obesity   . Peripheral vascular disease ([place])    S/p extensive  revascularization by vascular surgery 05/2019  . Post splenectomy syndrome   . Psoriasis   . Severe aortic stenosis    03/21/20 RHC with peak gradient 27.4 mmHg and valve area 0.87.  [name] Sleep apnea    uses cpap  . Stroke ([place])   . Tobacco use disorder    recently quit  . Tubular adenoma 08/2013   Dr. Hilarie Fredrickson  . Weak urinary stream     Social History   Socioeconomic History  . Marital status: Widowed    Spouse name: Not on file  . Number of children: 2  . Years of education: Not on file  . Highest education level: Not on file  Occupational History  . Occupation: Chief Operating Officer at [employer]: Disabled mostly due to neuropathy  Tobacco Use  . Smoking status: Former Smoker    Packs/day: 0.75    Years: 40.00    Pack years: 30.00    Types: Cigarettes    Quit date: 04/15/2017    Years since quitting: 3.0  . Smokeless tobacco: Never Used  . Tobacco comment: 1 cigarette a day  Vaping Use  . Vaping Use: Never used  Substance and Sexual Activity  . Alcohol use: No  . Drug use: No  . Sexual activity: Never  Other Topics Concern  . Not on file  Social History Narrative   Wife died Jan 22, 2019      Has living will   Gearldine Shown and his wife Levada Dy should be his health care POA   Would accept resuscitation --but no prolonged ventilation   No tube feeds if cognitively unaware   Social Determinants of Health   Financial Resource Strain: Low Risk   . Difficulty of Paying Living Expenses: Not very hard  Food Insecurity: No Food Insecurity  . Worried About Charity fundraiser in the Last Year: Never true  . Ran Out of Food in the Last Year: Never true  Transportation Needs: No Transportation Needs  . Lack of Transportation (Medical): No  . Lack of Transportation (Non-Medical): No  Physical Activity:   . Days of Exercise per Week: Not on file  . Minutes of Exercise per Session: Not on file  Stress: No Stress Concern Present  . Feeling of Stress : Only a little  Social  Connections: Unknown  . Frequency of Communication with Friends and Family: More than three times a week  . Frequency of Social Gatherings with Friends and Family: Not on file  . Attends Religious Services: Not on file  . Active Member of Clubs or Organizations: Not on file  . Attends Club or  Organization Meetings: Not on file  . Marital Status: Not on file  Intimate Partner Violence: Unknown  . Fear of Current or Ex-Partner: No  . Emotionally Abused: No  . Physically Abused: No  . Sexually Abused: Not on file    Past Surgical History:  Procedure Laterality Date  . CARDIAC CATHETERIZATION  08/2010   LAD: 80% ISR, RCA: 80% ostial, OM 80-90%  . CAROTID ENDARTERECTOMY  08/2010   left/ Dr. Kellie Simmering  . CATARACT EXTRACTION W/PHACO Left 07/09/2017   Procedure: CATARACT EXTRACTION PHACO AND INTRAOCULAR LENS PLACEMENT (IOC);  Surgeon: Birder Robson, MD;  Location: [facility] OR[facility];  Service: Ophthalmology;  Laterality: Left;  [place] 00:23 AP% 14.2 CDE 3.26 Fluid pack lot # [id number]  . CATARACT EXTRACTION W/PHACO Right 09/10/2017   Procedure: CATARACT EXTRACTION PHACO AND INTRAOCULAR LENS PLACEMENT (IOC);  Surgeon: Birder Robson, MD;  Location: [facility];  Service: Ophthalmology;  Laterality: Right;  [place] 00:26.4 AP% 17.3 CDE 4.59 Fluid Pack Lot # [id number]  . COLONOSCOPY    . CORONARY ANGIOPLASTY     LAD: before CABG  . CORONARY ARTERY BYPASS GRAFT  09/06/2010   At [facility]: LIMA to LAD, left radial to RCA, sequential SVG to OM3 and 4  . CORONARY STENT INTERVENTION N/A 03/23/2020   Procedure: CORONARY STENT INTERVENTION;  Surgeon: Wellington Hampshire, MD;  Location: [place] [facility];  Service: Cardiovascular;  Laterality: N/A;  RCA  . ESOPHAGOGASTRODUODENOSCOPY (EGD) WITH PROPOFOL N/A 04/11/2020   Procedure: ESOPHAGOGASTRODUODENOSCOPY (EGD) WITH PROPOFOL;  Surgeon: Lucilla Lame, MD;  Location: [facility];  Service: Endoscopy;  Laterality: N/A;  . heart stents  Jan 2011   leg stents 06/2009 and  03/2010  . LOWER EXTREMITY ANGIOGRAPHY Left 06/09/2019   Procedure: LOWER EXTREMITY ANGIOGRAPHY;  Surgeon: Katha Cabal, MD;  Location: [place] [facility];  Service: Cardiovascular;  Laterality: Left;  . PTA of illiac and SFA  multiple   Dr. Ronalee Belts, s/p revision 11/2012  . RIGHT AND LEFT HEART CATH N/A 03/21/2020   Procedure: RIGHT AND LEFT HEART CATH possible percutaneous intervention;  Surgeon: Wellington Hampshire, MD;  Location: [place] [facility];  Service: Cardiovascular;  Laterality: N/A;  . RIGHT/LEFT HEART CATH AND CORONARY/GRAFT ANGIOGRAPHY N/A 02/15/2020   Procedure: RIGHT/LEFT HEART CATH AND CORONARY/GRAFT ANGIOGRAPHY;  Surgeon: Wellington Hampshire, MD;  Location: [place] [facility];  Service: Cardiovascular;  Laterality: N/A;  . SPLENECTOMY    . VASCULAR SURGERY     LEG STENTS    Family History  Problem Relation Age of Onset  . Lung cancer Father 74  . Arthritis Father   . Breast cancer Sister 79  . Alcoholism Sister   . Dementia Mother   . Alzheimer's disease Mother   . Arthritis Mother   . Alcoholism Brother   . Epilepsy Sister   . Diabetes Paternal Grandfather   . Colon polyps Paternal Grandfather 43  . Alcoholism Sister   . Alcoholism Sister   . Alcoholism Brother     Allergies  Allergen Reactions  . Contrast Media [Iodinated Diagnostic Agents] Rash and Other (See Comments)    Got very hot and red   . Glipizide Other (See Comments)    ANTIDIABETICS. Burning  . Hydroxychloroquine Other (See Comments)    Stomach upset  . Metrizamide Other (See Comments)    Got very hot and red  . Penicillins Hives and Swelling    Has patient had a PCN reaction causing immediate rash, facial/tongue/throat swelling, SOB or lightheadedness with  hypotension: yes Has patient had a PCN reaction causing severe rash involving mucus membranes or skin necrosis: no  Has patient had a PCN reaction that required hospitalization: yes Has patient had a PCN reaction occurring within  the last 10 years: no If all of the above answers are "NO", then may proceed with Cephalosporin use.        Assessment & Plan:   1. CKD (chronic kidney disease), stage IV (HCC) Recommend:  At this time the patient does not have appropriate extremity access for dialysis  Patient should have a left brachial cephalic AVF  created.  The risks, benefits and alternative therapies were reviewed in detail with the patient.  All questions were answered.  The patient agrees to proceed with surgery.    2. Hyperlipidemia, mixed Continue statin as ordered and reviewed, no changes at this time   3. Essential hypertension Continue antihypertensive medications as already ordered, these medications have been reviewed and there are no changes at this time.    Current Outpatient Medications on File Prior to Visit  Medication Sig Dispense Refill  . albuterol (PROAIR HFA) 108 (90 Base) MCG/ACT inhaler Inhale 2 puffs into the lungs every 6 (six) hours as needed for shortness of breath. 18 g 3  . busPIRone (BUSPAR) 10 MG tablet TAKE 2 TABLETS(20 MG) BY MOUTH TWICE DAILY (Patient taking differently: Take 20 mg by mouth 2 (two) times daily. ) 360 tablet 3  . calcitRIOL (ROCALTROL) 0.25 MCG capsule Take 0.25 mcg by mouth every Monday, Wednesday, and Friday. Monday then Wednesday then Friday    . carvedilol (COREG) 12.5 MG tablet Take 1 tablet (12.5 mg total) by mouth 2 (two) times daily. (Patient taking differently: Take 12.5 mg by mouth 2 (two) times daily with a meal. ) 180 tablet 1  . clopidogrel (PLAVIX) 75 MG tablet Take 1 tablet (75 mg total) by mouth daily. 30 tablet 11  . cyclobenzaprine (FLEXERIL) 10 MG tablet Take 1 tablet (10 mg total) by mouth 3 (three) times daily as needed for muscle spasms. 30 tablet 0  . Dextran 70-Hypromellose (ARTIFICIAL TEARS) 0.1-0.3 % SOLN Place 1 drop into both eyes 4 (four) times daily as needed (dry eyes).    . finasteride (PROSCAR) 5 MG tablet Take 5 mg by mouth  daily.    . hydrALAZINE (APRESOLINE) 25 MG tablet Take 1 tablet (25 mg total) by mouth every 8 (eight) hours. 90 tablet 2  . insulin aspart (NOVOLOG) 100 UNIT/ML injection Inject 0-9 Units into the skin 3 (three) times daily with meals. 10 mL 11  . isosorbide mononitrate (IMDUR) 30 MG 24 hr tablet Take 3 tablets (90 mg total) by mouth daily. 90 tablet 2  . nitroGLYCERIN (NITROSTAT) 0.4 MG SL tablet DISSOLVE 1 TABLET UNDER THE TONGUE EVERY 5 MINUTES AS NEEDED FOR CHEST PAIN. DO NOT EXCEED A TOTAL OF 3 DOSES IN 15 MINUTES. 25 tablet 0  . predniSONE (DELTASONE) 50 MG tablet Take 13 hours, seven hours, and 1 hour prior to your CT. 3 tablet 0  . rosuvastatin (CRESTOR) 10 MG tablet Take 1 tablet (10 mg total) by mouth daily. 90 tablet 3  . sertraline (ZOLOFT) 100 MG tablet Take 1 tablet (100 mg total) by mouth daily. 90 tablet 3  . tamsulosin (FLOMAX) 0.4 MG CAPS capsule Take 1 capsule by mouth daily (Patient taking differently: Take 0.4 mg by mouth daily. ) 90 capsule 3  . torsemide (DEMADEX) 20 MG tablet Take 2 tablets (40 mg total) by  mouth 2 (two) times daily. 360 tablet 1   No current facility-administered medications on file prior to visit.    There are no Patient Instructions on file for this visit. No follow-ups on file.   Kris Hartmann, NP

## 2020-04-27 NOTE — Progress Notes (Signed)
Subjective:    Patient ID: [name] [name], male    DOB: January 07, 1956, 64 y.o.   MRN: [id number] Chief Complaint  Patient presents with  . Follow-up    est pt. kolluru. vein mapping     The patient is seen for evaluation for dialysis access. The patient has chronic renal insufficiency stage V secondary to hypertension and diabetes. The patient's most recent creatinine clearance is less than 20. The patient volume status has not yet become an issue. Patient's blood pressures been relatively well controlled. There are mild uremic symptoms which appear to be relatively well tolerated at this time. The patient is right-handed.  The patient has been considering the various methods of dialysis and wishes to proceed with hemodialysis and therefore creation of AV access.  The patient denies amaurosis fugax or recent TIA symptoms. There are no recent neurological changes noted. The patient denies claudication symptoms or rest pain symptoms. The patient denies history of DVT, PE or superficial thrombophlebitis. The patient denies recent episodes of angina or shortness of breath.   Today noninvasive studies show adequate vein diameters for creation of a left brachiocephalic AV fistula. It is also noted that the patient has his left radial artery removed for CABG. But his ulnar and brachial arteries are patent. The right upper extremity he has an occluded distal ulnar artery in the brachial and radial arteries are patent. The patient also has triphasic waveforms in the arteries in his left upper extremity whereas the patient has triphasic/monophasic waveforms in his right upper extremity.   Review of Systems  Musculoskeletal: Positive for gait problem.  Neurological: Positive for numbness.  Hematological: Bruises/bleeds easily.  All other systems reviewed and are negative.      Objective:   Physical Exam Vitals reviewed.  HENT:     Head: Normocephalic.  Cardiovascular:     Rate and Rhythm:  Normal rate and regular rhythm.     Pulses:          Radial pulses are 0 on the left side.  Pulmonary:     Effort: Pulmonary effort is normal.  Neurological:     Mental Status: He is alert and oriented to person, place, and time.     Motor: Weakness present.     Gait: Gait abnormal.  Psychiatric:        Mood and Affect: Mood normal.        Behavior: Behavior normal.        Thought Content: Thought content normal.        Judgment: Judgment normal.     BP (!) 152/81   Pulse (!) 102   Ht 5\' 6"  (1.676 m)   Wt 230 lb (104.3 kg)   BMI 37.12 kg/m   Past Medical History:  Diagnosis Date  . 3-vessel CAD    8/30 LHC with radial graft to RCA occluded at the ostium with critical stenosis of the ostial native RCA.  . Adenomatous colon polyp   . Allergy   . Anxiety   . Arthritis    RA  . Bell's palsy 2007  . CAD (coronary artery disease)    03/21/2020 LHC with patent LIMA to LAD and SVG to OM 3.  Radial graft to RCA occluded at the ostium with critical stenosis of the ostial and native RCA.  . Carotid artery occlusion    Bilateral carotid stenosis s/p left-sided CEA.  . Cataract    Dr. Dawna Part  . CKD (chronic kidney disease), stage IV ([place])  03/2020 vascular surgery access planned  . Depression   . Diabetes mellitus   . Diverticulosis   . Duodenitis   . DVT (deep venous thrombosis) (H[facility])    in leg  . ESRD (end stage renal disease) ([place])   . Gastropathy   . GERD (gastroesophageal reflux disease)   . Heart failure with preserved ejection fraction ([place])    8/30 RHC with moderately to severely elevated filling pressures and pulmonary wedge pressure 31 mmHg, moderate pulmonary hypertension  . Helicobacter pylori gastritis   . Hx of CABG    2012  . Hyperlipidemia   . Hypertension   . Mitral regurgitation and aortic stenosis    RHC 8/30 and echo 02-09-2020.  [name] Myocardial infarction ([place])   . Neuropathy   . Obesity   . Peripheral vascular disease ([place])    S/p extensive  revascularization by vascular surgery 05/2019  . Post splenectomy syndrome   . Psoriasis   . Severe aortic stenosis    03/21/20 RHC with peak gradient 27.4 mmHg and valve area 0.87.  [name] Sleep apnea    uses cpap  . Stroke ([place])   . Tobacco use disorder    recently quit  . Tubular adenoma 08/2013   Dr. Hilarie Fredrickson  . Weak urinary stream     Social History   Socioeconomic History  . Marital status: Widowed    Spouse name: Not on file  . Number of children: 2  . Years of education: Not on file  . Highest education level: Not on file  Occupational History  . Occupation: Chief Operating Officer at [employer]: Disabled mostly due to neuropathy  Tobacco Use  . Smoking status: Former Smoker    Packs/day: 0.75    Years: 40.00    Pack years: 30.00    Types: Cigarettes    Quit date: 04/15/2017    Years since quitting: 3.0  . Smokeless tobacco: Never Used  . Tobacco comment: 1 cigarette a day  Vaping Use  . Vaping Use: Never used  Substance and Sexual Activity  . Alcohol use: No  . Drug use: No  . Sexual activity: Never  Other Topics Concern  . Not on file  Social History Narrative   Wife died 02-09-19      Has living will   Gearldine Shown and his wife Levada Dy should be his health care POA   Would accept resuscitation --but no prolonged ventilation   No tube feeds if cognitively unaware   Social Determinants of Health   Financial Resource Strain: Low Risk   . Difficulty of Paying Living Expenses: Not very hard  Food Insecurity: No Food Insecurity  . Worried About Charity fundraiser in the Last Year: Never true  . Ran Out of Food in the Last Year: Never true  Transportation Needs: No Transportation Needs  . Lack of Transportation (Medical): No  . Lack of Transportation (Non-Medical): No  Physical Activity:   . Days of Exercise per Week: Not on file  . Minutes of Exercise per Session: Not on file  Stress: No Stress Concern Present  . Feeling of Stress : Only a little  Social  Connections: Unknown  . Frequency of Communication with Friends and Family: More than three times a week  . Frequency of Social Gatherings with Friends and Family: Not on file  . Attends Religious Services: Not on file  . Active Member of Clubs or Organizations: Not on file  . Attends Club or  Organization Meetings: Not on file  . Marital Status: Not on file  Intimate Partner Violence: Unknown  . Fear of Current or Ex-Partner: No  . Emotionally Abused: No  . Physically Abused: No  . Sexually Abused: Not on file    Past Surgical History:  Procedure Laterality Date  . CARDIAC CATHETERIZATION  08/2010   LAD: 80% ISR, RCA: 80% ostial, OM 80-90%  . CAROTID ENDARTERECTOMY  08/2010   left/ Dr. Kellie Simmering  . CATARACT EXTRACTION W/PHACO Left 07/09/2017   Procedure: CATARACT EXTRACTION PHACO AND INTRAOCULAR LENS PLACEMENT (IOC);  Surgeon: Birder Robson, MD;  Location: [facility] ORS;  Service: Ophthalmology;  Laterality: Left;  [place] 00:23 AP% 14.2 CDE 3.26 Fluid pack lot # [id number]  . CATARACT EXTRACTION W/PHACO Right 09/10/2017   Procedure: CATARACT EXTRACTION PHACO AND INTRAOCULAR LENS PLACEMENT (IOC);  Surgeon: Birder Robson, MD;  Location: [facility] OR[facility];  Service: Ophthalmology;  Laterality: Right;  [place] 00:26.4 AP% 17.3 CDE 4.59 Fluid Pack Lot # [id number]  . COLONOSCOPY    . CORONARY ANGIOPLASTY     LAD: before CABG  . CORONARY ARTERY BYPASS GRAFT  09/06/2010   At [facility]: LIMA to LAD, left radial to RCA, sequential SVG to OM3 and 4  . CORONARY STENT INTERVENTION N/A 03/23/2020   Procedure: CORONARY STENT INTERVENTION;  Surgeon: Wellington Hampshire, MD;  Location: [facility];  Service: Cardiovascular;  Laterality: N/A;  RCA  . ESOPHAGOGASTRODUODENOSCOPY (EGD) WITH PROPOFOL N/A 04/11/2020   Procedure: ESOPHAGOGASTRODUODENOSCOPY (EGD) WITH PROPOFOL;  Surgeon: Lucilla Lame, MD;  Location: [facility] END[facility]COPY;  Service: Endoscopy;  Laterality: N/A;  . heart stents  Jan 2011   leg stents 06/2009 and  03/2010  . LOWER EXTREMITY ANGIOGRAPHY Left 06/09/2019   Procedure: LOWER EXTREMITY ANGIOGRAPHY;  Surgeon: Katha Cabal, MD;  Location: [facility];  Service: Cardiovascular;  Laterality: Left;  . PTA of illiac and SFA  multiple   Dr. Ronalee Belts, s/p revision 11/2012  . RIGHT AND LEFT HEART CATH N/A 03/21/2020   Procedure: RIGHT AND LEFT HEART CATH possible percutaneous intervention;  Surgeon: Wellington Hampshire, MD;  Location: [facility];  Service: Cardiovascular;  Laterality: N/A;  . RIGHT/LEFT HEART CATH AND CORONARY/GRAFT ANGIOGRAPHY N/A 02/15/2020   Procedure: RIGHT/LEFT HEART CATH AND CORONARY/GRAFT ANGIOGRAPHY;  Surgeon: Wellington Hampshire, MD;  Location: [facility];  Service: Cardiovascular;  Laterality: N/A;  . SPLENECTOMY    . VASCULAR SURGERY     LEG STENTS    Family History  Problem Relation Age of Onset  . Lung cancer Father 31  . Arthritis Father   . Breast cancer Sister 47  . Alcoholism Sister   . Dementia Mother   . Alzheimer's disease Mother   . Arthritis Mother   . Alcoholism Brother   . Epilepsy Sister   . Diabetes Paternal Grandfather   . Colon polyps Paternal Grandfather 69  . Alcoholism Sister   . Alcoholism Sister   . Alcoholism Brother     Allergies  Allergen Reactions  . Contrast Media [Iodinated Diagnostic Agents] Rash and Other (See Comments)    Got very hot and red   . Glipizide Other (See Comments)    ANTIDIABETICS. Burning  . Hydroxychloroquine Other (See Comments)    Stomach upset  . Metrizamide Other (See Comments)    Got very hot and red  . Penicillins Hives and Swelling    Has patient had a PCN reaction causing immediate rash, facial/tongue/throat swelling, SOB or lightheadedness with  hypotension: yes Has patient had a PCN reaction causing severe rash involving mucus membranes or skin necrosis: no  Has patient had a PCN reaction that required hospitalization: yes Has patient had a PCN reaction occurring within  the last 10 years: no If all of the above answers are "NO", then may proceed with Cephalosporin use.        Assessment & Plan:   1. CKD (chronic kidney disease), stage IV (HCC) Recommend:  At this time the patient does not have appropriate extremity access for dialysis  Patient should have a left brachial cephalic AVF  created.  The risks, benefits and alternative therapies were reviewed in detail with the patient.  All questions were answered.  The patient agrees to proceed with surgery.    2. Hyperlipidemia, mixed Continue statin as ordered and reviewed, no changes at this time   3. Essential hypertension Continue antihypertensive medications as already ordered, these medications have been reviewed and there are no changes at this time.    Current Outpatient Medications on File Prior to Visit  Medication Sig Dispense Refill  . albuterol (PROAIR HFA) 108 (90 Base) MCG/ACT inhaler Inhale 2 puffs into the lungs every 6 (six) hours as needed for shortness of breath. 18 g 3  . busPIRone (BUSPAR) 10 MG tablet TAKE 2 TABLETS(20 MG) BY MOUTH TWICE DAILY (Patient taking differently: Take 20 mg by mouth 2 (two) times daily. ) 360 tablet 3  . calcitRIOL (ROCALTROL) 0.25 MCG capsule Take 0.25 mcg by mouth every Monday, Wednesday, and Friday. Monday then Wednesday then Friday    . carvedilol (COREG) 12.5 MG tablet Take 1 tablet (12.5 mg total) by mouth 2 (two) times daily. (Patient taking differently: Take 12.5 mg by mouth 2 (two) times daily with a meal. ) 180 tablet 1  . clopidogrel (PLAVIX) 75 MG tablet Take 1 tablet (75 mg total) by mouth daily. 30 tablet 11  . cyclobenzaprine (FLEXERIL) 10 MG tablet Take 1 tablet (10 mg total) by mouth 3 (three) times daily as needed for muscle spasms. 30 tablet 0  . Dextran 70-Hypromellose (ARTIFICIAL TEARS) 0.1-0.3 % SOLN Place 1 drop into both eyes 4 (four) times daily as needed (dry eyes).    . finasteride (PROSCAR) 5 MG tablet Take 5 mg by mouth  daily.    . hydrALAZINE (APRESOLINE) 25 MG tablet Take 1 tablet (25 mg total) by mouth every 8 (eight) hours. 90 tablet 2  . insulin aspart (NOVOLOG) 100 UNIT/ML injection Inject 0-9 Units into the skin 3 (three) times daily with meals. 10 mL 11  . isosorbide mononitrate (IMDUR) 30 MG 24 hr tablet Take 3 tablets (90 mg total) by mouth daily. 90 tablet 2  . nitroGLYCERIN (NITROSTAT) 0.4 MG SL tablet DISSOLVE 1 TABLET UNDER THE TONGUE EVERY 5 MINUTES AS NEEDED FOR CHEST PAIN. DO NOT EXCEED A TOTAL OF 3 DOSES IN 15 MINUTES. 25 tablet 0  . predniSONE (DELTASONE) 50 MG tablet Take 13 hours, seven hours, and 1 hour prior to your CT. 3 tablet 0  . rosuvastatin (CRESTOR) 10 MG tablet Take 1 tablet (10 mg total) by mouth daily. 90 tablet 3  . sertraline (ZOLOFT) 100 MG tablet Take 1 tablet (100 mg total) by mouth daily. 90 tablet 3  . tamsulosin (FLOMAX) 0.4 MG CAPS capsule Take 1 capsule by mouth daily (Patient taking differently: Take 0.4 mg by mouth daily. ) 90 capsule 3  . torsemide (DEMADEX) 20 MG tablet Take 2 tablets (40 mg total) by  mouth 2 (two) times daily. 360 tablet 1   No current facility-administered medications on file prior to visit.    There are no Patient Instructions on file for this visit. No follow-ups on file.   Kris Hartmann, NP

## 2020-04-28 ENCOUNTER — Telehealth (INDEPENDENT_AMBULATORY_CARE_PROVIDER_SITE_OTHER): Payer: Self-pay

## 2020-04-28 NOTE — Telephone Encounter (Signed)
I attempted to contact the patient and a message was left for a return call. 

## 2020-05-02 DIAGNOSIS — E1142 Type 2 diabetes mellitus with diabetic polyneuropathy: Secondary | ICD-10-CM | POA: Diagnosis not present

## 2020-05-02 DIAGNOSIS — B351 Tinea unguium: Secondary | ICD-10-CM | POA: Diagnosis not present

## 2020-05-04 DIAGNOSIS — M0579 Rheumatoid arthritis with rheumatoid factor of multiple sites without organ or systems involvement: Secondary | ICD-10-CM | POA: Diagnosis not present

## 2020-05-05 ENCOUNTER — Ambulatory Visit (HOSPITAL_COMMUNITY)
Admission: RE | Admit: 2020-05-05 | Discharge: 2020-05-05 | Disposition: A | Discharge status: Home / Self Care | Payer: Medicare Other | Source: Ambulatory Visit | Attending: Physician Assistant | Admitting: Physician Assistant

## 2020-05-05 ENCOUNTER — Telehealth: Payer: Self-pay

## 2020-05-05 ENCOUNTER — Other Ambulatory Visit: Payer: Self-pay

## 2020-05-05 DIAGNOSIS — I700 Atherosclerosis of aorta: Secondary | ICD-10-CM | POA: Diagnosis not present

## 2020-05-05 DIAGNOSIS — E1122 Type 2 diabetes mellitus with diabetic chronic kidney disease: Secondary | ICD-10-CM | POA: Diagnosis not present

## 2020-05-05 DIAGNOSIS — I350 Nonrheumatic aortic (valve) stenosis: Secondary | ICD-10-CM | POA: Diagnosis not present

## 2020-05-05 DIAGNOSIS — R809 Proteinuria, unspecified: Secondary | ICD-10-CM | POA: Diagnosis not present

## 2020-05-05 DIAGNOSIS — N184 Chronic kidney disease, stage 4 (severe): Secondary | ICD-10-CM | POA: Diagnosis not present

## 2020-05-05 DIAGNOSIS — I129 Hypertensive chronic kidney disease with stage 1 through stage 4 chronic kidney disease, or unspecified chronic kidney disease: Secondary | ICD-10-CM | POA: Diagnosis not present

## 2020-05-05 DIAGNOSIS — N2581 Secondary hyperparathyroidism of renal origin: Secondary | ICD-10-CM | POA: Diagnosis not present

## 2020-05-05 DIAGNOSIS — D631 Anemia in chronic kidney disease: Secondary | ICD-10-CM | POA: Diagnosis not present

## 2020-05-05 MED ORDER — DILTIAZEM HCL 25 MG/5ML IV SOLN
INTRAVENOUS | Status: AC
  Filled 2020-05-05: qty 5

## 2020-05-05 MED ORDER — METOPROLOL TARTRATE 5 MG/5ML IV SOLN
5.0000 mg | INTRAVENOUS | Status: DC | PRN
  Administered 2020-05-05 (×2): 5 mg via INTRAVENOUS

## 2020-05-05 MED ORDER — DILTIAZEM HCL 25 MG/5ML IV SOLN
5.0000 mg | Freq: Once | INTRAVENOUS | Status: AC
  Administered 2020-05-05: 5 mg via INTRAVENOUS
  Filled 2020-05-05: qty 5

## 2020-05-05 MED ORDER — METOPROLOL TARTRATE 5 MG/5ML IV SOLN
INTRAVENOUS | Status: AC
  Administered 2020-05-05: 5 mg
  Filled 2020-05-05: qty 20

## 2020-05-05 MED ORDER — IOHEXOL 350 MG/ML SOLN
80.0000 mL | Freq: Once | INTRAVENOUS | Status: AC | PRN
  Administered 2020-05-05: 80 mL via INTRAVENOUS

## 2020-05-05 NOTE — Telephone Encounter (Signed)
Palliative care SW LVM for patient to schedule in home visit. Awaiting return call.

## 2020-05-05 NOTE — Telephone Encounter (Signed)
Palliative care SW Verna Czech, to obtain update on patient and possibly schedule in home visit. LVM. Awaiting return call.

## 2020-05-05 NOTE — Telephone Encounter (Signed)
Palliative care SW outreached patients listed friend, Levada Dy, who shared that she as not seen patient in about week but that her husband, Shanon Brow, can update palliative care on how patient has been.

## 2020-05-06 ENCOUNTER — Telehealth: Payer: Self-pay

## 2020-05-06 NOTE — Telephone Encounter (Signed)
Patient called SW back. In home visit scheduled for Thu 05/12/2020 @930  with SW and RN.

## 2020-05-09 ENCOUNTER — Other Ambulatory Visit: Payer: Self-pay | Admitting: Internal Medicine

## 2020-05-11 ENCOUNTER — Telehealth: Payer: Self-pay

## 2020-05-12 ENCOUNTER — Other Ambulatory Visit: Payer: Medicare Other

## 2020-05-12 ENCOUNTER — Other Ambulatory Visit (INDEPENDENT_AMBULATORY_CARE_PROVIDER_SITE_OTHER): Payer: Self-pay | Admitting: Nurse Practitioner

## 2020-05-12 ENCOUNTER — Encounter
Admission: RE | Admit: 2020-05-12 | Discharge: 2020-05-12 | Disposition: A | Discharge status: Home / Self Care | Payer: Medicare Other | Source: Ambulatory Visit | Attending: Vascular Surgery | Admitting: Vascular Surgery

## 2020-05-12 ENCOUNTER — Other Ambulatory Visit: Payer: Self-pay

## 2020-05-12 VITALS — BP 118/90 | HR 80 | Wt 225.0 lb

## 2020-05-12 DIAGNOSIS — Z515 Encounter for palliative care: Secondary | ICD-10-CM

## 2020-05-12 DIAGNOSIS — Z01818 Encounter for other preprocedural examination: Secondary | ICD-10-CM | POA: Insufficient documentation

## 2020-05-12 HISTORY — DX: Anemia, unspecified: D64.9

## 2020-05-12 HISTORY — DX: Unspecified asthma, uncomplicated: J45.909

## 2020-05-12 NOTE — Progress Notes (Signed)
COMMUNITY PALLIATIVE CARE SW NOTE  PATIENT NAME: [name] [name] DOB: 1955/08/31 MRN: [id number]  PRIMARY CARE PROVIDER: [name], MD  RESPONSIBLE PARTY:  Acct ID - Guarantor Home Phone Work Phone Relationship Acct Type  [id number] DEQUARIUS, [phone]  Self P/F     [address], [place], [place] [address]     PLAN OF CARE and INTERVENTIONS:             1. GOALS OF CARE/ ADVANCE CARE PLANNING:  Patient is currently a full code. Patient's goal is to remain at home as independent as possible. 2. SOCIAL/EMOTIONAL/SPIRITUAL ASSESSMENT/ INTERVENTIONS:  SW and RN Almyra Free met with patient in patients home. Patient lives in a one story home. Patient updated SW and RN medical condition and changes. Patient shared that he is scheduled to have surgery next week for dialysis port to be placed. Patient will be receiving dialyiss once port area is healed. Patient shared he eats well. Sleeps well. No recent falls reported, but he is not using RW or SPC at this time. No pain reported. No recent medication changes. RN reviewed medications and took vitals. SW discussed goals, reviewed care plan, provided emotional support, used active and reflective listening. Palliative care will continue to monitor and assist with long term care planning as needed.  3. PATIENT/CAREGIVER EDUCATION/ COPING:  Patient A&O. Patient openly engaged in conversation during visit. Patient answered all questions appropriately. Patient shared that he is battling depression and has started smoking again due to all of his medical conditions. Patients stepson and DIL are supportive. 4. PERSONAL EMERGENCY PLAN:  Patient will call 9-1-1 for emergencies.  5. COMMUNITY RESOURCES COORDINATION/ HEALTH CARE NAVIGATION:  Patient manages his care. 6. FINANCIAL/LEGAL CONCERNS/INTERVENTIONS:  None.     SOCIAL HX:  Social History   Tobacco Use  . Smoking status: Former Smoker    Packs/day: 0.75    Years: 40.00    Pack years: 30.00     Types: Cigarettes    Quit date: 04/15/2017    Years since quitting: 3.0  . Smokeless tobacco: Never Used  . Tobacco comment: 1 cigarette a day  Substance Use Topics  . Alcohol use: No    CODE STATUS: Full code ADVANCED DIRECTIVES: Y MOST FORM COMPLETE: N HOSPICE EDUCATION PROVIDED: N  PPS: Patient is independent with all ADL's and meal prep mostly microwave meals.    Time spent: 35 min.      Doreene Eland, [place]

## 2020-05-12 NOTE — Patient Instructions (Addendum)
Your procedure is scheduled on: 05/20/20- Friday Report to Day Surgery on the 2nd floor of the [facility]. To find out your arrival time, please call [phone] between 1PM - 3PM on: 05/19/20  REMEMBER: Instructions that are not followed completely may result in serious medical risk, up to and including death; or upon the discretion of your surgeon and anesthesiologist your surgery may need to be rescheduled.  Do not eat food after midnight the night before surgery.  No gum chewing, lozengers or hard candies.  You may however, drink CLEAR liquids up to 2 hours before you are scheduled to arrive for your surgery. Do not drink anything within 2 hours of your scheduled arrival time.  Clear liquids include: - Type 1 and Type 2 diabetics should only drink water.  ITAKE THESE MEDICATIONS THE MORNING OF SURGERY WITH A SIP OF WATER: - busPIRone (BUSPAR) 10 MG tablet - carvedilol (COREG) 12.5 MG tablet - hydrALAZINE (APRESOLINE) 25 MG tablet - isosorbide mononitrate (IMDUR) 30 MG 24 hr tablet - sertraline (ZOLOFT) 100 MG tablet - tamsulosin (FLOMAX) 0.4 MG CAPS capsule   Use albuterol (PROAIR HFA) 108 (90 Base) MCG/ACT inhaler on the day of surgery and bring to the hospital.  Take 1/2 of usual insulin dose the night before surgery and none on the morning of surgery.   One week prior to surgery: Stop Anti-inflammatories (NSAIDS) such as Advil, Aleve, Ibuprofen, Motrin, Naproxen, Naprosyn and Aspirin based products such as Excedrin, Goodys Powder, BC Powder.  Stop ANY OVER THE COUNTER supplements until after surgery  (You may continue taking Tylenol, Vitamin D, Vitamin B, and multivitamin.)  No Alcohol for 24 hours before or after surgery.  No Smoking including e-cigarettes for 24 hours prior to surgery.  No chewable tobacco products for at least 6 hours prior to surgery.  No nicotine patches on the day of surgery.  Do not use any "recreational" drugs for at least a week prior to  your surgery.  Please be advised that the combination of cocaine and anesthesia may have negative outcomes, up to and including death. If you test positive for cocaine, your surgery will be cancelled.  On the morning of surgery brush your teeth with toothpaste and water, you may rinse your mouth with mouthwash if you wish. Do not swallow any toothpaste or mouthwash.  Do not wear jewelry, make-up, hairpins, clips or nail polish.  Do not wear lotions, powders, or perfumes.   Do not shave 48 hours prior to surgery.   Contact lenses, hearing aids and dentures may not be worn into surgery.  Do not bring valuables to the hospital. [facility] is not responsible for any missing/lost belongings or valuables.   Use CHG Soap or wipes as directed on instruction sheet.  Bring your C-PAP to the hospital with you in case you may have to spend the night.   Notify your doctor if there is any change in your medical condition (cold, fever, infection).  Wear comfortable clothing (specific to your surgery type) to the hospital.  Plan for stool softeners for home use; pain medications have a tendency to cause constipation. You can also help prevent constipation by eating foods high in fiber such as fruits and vegetables and drinking plenty of fluids as your diet allows.  After surgery, you can help prevent lung complications by doing breathing exercises.  Take deep breaths and cough every 1-2 hours. Your doctor may order a device called an Incentive Spirometer to help you take deep  breaths. When coughing or sneezing, hold a pillow firmly against your incision with both hands. This is called "splinting." Doing this helps protect your incision. It also decreases belly discomfort.  If you are being admitted to the hospital overnight, leave your suitcase in the car. After surgery it may be brought to your room.  If you are being discharged the day of surgery, you will not be allowed to drive home. You  will need a responsible adult (18 years or older) to drive you home and stay with you that night.   If you are taking public transportation, you will need to have a responsible adult (18 years or older) with you. Please confirm with your physician that it is acceptable to use public transportation.   Please call the [facility] at [phone] if you have any questions about these instructions.  Visitation Policy:  Patients undergoing a surgery or procedure may have one family member or support person with them as long as that person is not COVID-19 positive or experiencing its symptoms.  That person may remain in the waiting area during the procedure.  Inpatient Visitation Update:   In an effort to ensure the safety of our team members and our patients, we are implementing a change to our visitation policy:  Effective Monday, Aug. 9, at 7 a.m., inpatients will be allowed one support person.  o The support person may change daily.  o The support person must pass our screening, gel in and out, and wear a mask at all times, including in the patient's room.  o Patients must also wear a mask when staff or their support person are in the room.  o Masking is required regardless of vaccination status.  Systemwide, no visitors 17 or younger.

## 2020-05-12 NOTE — Progress Notes (Signed)
PATIENT NAME: NAVARRE DIANA DOB: 07-01-1956 MRN: [id number]  PRIMARY CARE PROVIDER: [name], MD  RESPONSIBLE PARTY:  Acct ID - Guarantor Home Phone Work Phone Relationship Acct Type  [id number] JACADEN, [phone]  Self P/F     [address], [place], [place] [address]    PLAN OF CARE and INTERVENTIONS:               1.  GOALS OF CARE/ ADVANCE CARE PLANNING: Remain at home and as independent as possible.               2.  PATIENT/CAREGIVER EDUCATION:  Palliative Care Service, Safety, Hypoglycemia.               3.  DISEASE STATUS: Greeted at the door by patient.  Patient engaging in conversation and provides an update on his overall health.  Patient is scheduled for surgery next week for arteriovenous fistula creation.  Patient states plan is to begin dialysis.  He reports no issues with pain at this time.   Appetite is good he is consuming Healthy Choice meals.  He is not cooking due to tremors and frequently dropping items.  Shortness of breath is noted with rest and exertion.   He reports monitoring his weights and fluid intake.  Blood sugars are being checked 4 x daily.  FSBS this am is 119.  Patient states this has been above 300 at one point.  He noted a BS of 30 last Saturday. Patient took 2 glucose tablets.   We discussed s/s of hypoglycemia and patient verbalizes understanding.  Last fall reported about 4 months ago.  Patient is not using a cane in the home but will use it outside of the home.  He also has a wheelchair if needed.    HISTORY OF PRESENT ILLNESS:  64 year old male with ESRD.  Patient is being followed by Palliative Care monthly and PRN.  CODE STATUS: Full ADVANCED DIRECTIVES: Yes MOST FORM: No PPS: 50%   PHYSICAL EXAM:   VITALS:  BP 118/90 P 80 O2 Sats 98% LUNGS: CTA CARDIAC: HRR EXTREMITIES: 2+ edema to lower extremities SKIN: Warm and dry to touch NEURO: Alert and oriented x 3.         Lorenza Burton, RN

## 2020-05-15 ENCOUNTER — Other Ambulatory Visit: Payer: Self-pay | Admitting: Physician Assistant

## 2020-05-15 ENCOUNTER — Other Ambulatory Visit: Payer: Self-pay | Admitting: Cardiovascular Disease

## 2020-05-16 DIAGNOSIS — I129 Hypertensive chronic kidney disease with stage 1 through stage 4 chronic kidney disease, or unspecified chronic kidney disease: Secondary | ICD-10-CM | POA: Diagnosis not present

## 2020-05-16 DIAGNOSIS — E1122 Type 2 diabetes mellitus with diabetic chronic kidney disease: Secondary | ICD-10-CM | POA: Diagnosis not present

## 2020-05-16 DIAGNOSIS — R809 Proteinuria, unspecified: Secondary | ICD-10-CM | POA: Diagnosis not present

## 2020-05-16 DIAGNOSIS — N2581 Secondary hyperparathyroidism of renal origin: Secondary | ICD-10-CM | POA: Diagnosis not present

## 2020-05-16 DIAGNOSIS — N184 Chronic kidney disease, stage 4 (severe): Secondary | ICD-10-CM | POA: Diagnosis not present

## 2020-05-17 ENCOUNTER — Ambulatory Visit: Payer: Medicare Other | Admitting: Urgent Care

## 2020-05-17 ENCOUNTER — Other Ambulatory Visit: Payer: Self-pay

## 2020-05-17 ENCOUNTER — Ambulatory Visit (INDEPENDENT_AMBULATORY_CARE_PROVIDER_SITE_OTHER): Payer: Medicare Other | Admitting: Cardiovascular Disease

## 2020-05-17 ENCOUNTER — Encounter: Payer: Self-pay | Admitting: Cardiovascular Disease

## 2020-05-17 VITALS — BP 126/66 | HR 88 | Ht 66.0 in | Wt 225.0 lb

## 2020-05-17 DIAGNOSIS — I10 Essential (primary) hypertension: Secondary | ICD-10-CM

## 2020-05-17 DIAGNOSIS — K922 Gastrointestinal hemorrhage, unspecified: Secondary | ICD-10-CM | POA: Diagnosis not present

## 2020-05-17 DIAGNOSIS — I779 Disorder of arteries and arterioles, unspecified: Secondary | ICD-10-CM | POA: Diagnosis not present

## 2020-05-17 DIAGNOSIS — I200 Unstable angina: Secondary | ICD-10-CM | POA: Diagnosis not present

## 2020-05-17 DIAGNOSIS — I350 Nonrheumatic aortic (valve) stenosis: Secondary | ICD-10-CM

## 2020-05-17 DIAGNOSIS — I25118 Atherosclerotic heart disease of native coronary artery with other forms of angina pectoris: Secondary | ICD-10-CM

## 2020-05-17 DIAGNOSIS — E785 Hyperlipidemia, unspecified: Secondary | ICD-10-CM

## 2020-05-17 NOTE — Progress Notes (Signed)
Yes, I will take care of it. Thank you MA!

## 2020-05-17 NOTE — Patient Instructions (Addendum)
Medication Instructions:  Your physician recommends that you continue on your current medications as directed. Please refer to the Current Medication list given to you today.  *If you need a refill on your cardiac medications before your next appointment, please call your pharmacy*   Lab Work: None ordered If you have labs (blood work) drawn today and your tests are completely normal, you will receive your results only by: [name] MyChart Message (if you have MyChart) OR . A paper copy in the mail If you have any lab test that is abnormal or we need to change your treatment, we will call you to review the results.   Testing/Procedures: None ordered   Follow-Up: At [facility], you and your health needs are our priority.  As part of our continuing mission to provide you with exceptional heart care, we have created designated Provider Care Teams.  These Care Teams include your primary Cardiologist (physician) and Advanced Practice Providers (APPs -  Physician Assistants and Nurse Practitioners) who all work together to provide you with the care you need, when you need it.  We recommend signing up for the patient portal called "MyChart".  Sign up information is provided on this After Visit Summary.  MyChart is used to connect with patients for Virtual Visits (Telemedicine).  Patients are able to view lab/test results, encounter notes, upcoming appointments, etc.  Non-urgent messages can be sent to your provider as well.   To learn more about what you can do with MyChart, go to [email or web address].    Your next appointment:   3 month(s)  The format for your next appointment:   In Person  Provider:   You may see [name], MD or one of the following Advanced Practice Providers on your designated Care Team:    Murray Hodgkins, NP  Christell Faith, PA-C  Marrianne Mood, PA-C  Cadence Kathlen Mody, [place]    Other Instructions You have been referred to [facility]. Their  office will call you to schedule the appointment.

## 2020-05-17 NOTE — Progress Notes (Signed)
[facility] Perioperative Services  Pre-Admission/Anesthesia Testing Clinical Review  Date: 05/19/20  Patient Demographics:  Name: [name] [name] DOB:   1956/02/05 MRN:   [id number]  Planned Surgical Procedure(s):    Case: [id number] Date/Time: 05/20/20 0715   Procedure: ARTERIOVENOUS (AV) FISTULA CREATION (BRACHIAL CEPHALIC ) (Left )   Anesthesia type: General   Pre-op diagnosis: ESRD   Location: [facility] OR ROOM 07 / [place] ORS FOR ANESTHESIA GROUP   Surgeons: Katha Cabal, MD     NOTE: Available PAT nursing documentation and vital signs have been reviewed. Clinical nursing staff has updated patient's PMH/PSHx, current medication list, and drug allergies/intolerances to ensure comprehensive history available to assist in medical decision making as it pertains to the aforementioned surgical procedure and anticipated anesthetic course.   Clinical Discussion:  ISHMAIL [name] is a 64 y.o. male who is submitted for pre-surgical anesthesia review and clearance prior to him undergoing the above procedure. Patient is a Former Smoker (10 pack years). Pertinent PMH includes: CAD (s/p CABG in 2012), NSTEMI (01/2020), unstable angina, ischemic cardiomyopathy, HFpEF, severe aortic stenosis, heart murmur, MV regurgitation, thoracic aortic aneurysm, BILATERAL carotid stenosis (s/p left sided CEA), CVA, PVD, HTN, HLD, T2DM, GERD (uses OTC interventions PRN), OSAH (requires nocturnal PAP therapy), ESRD, COPD, asthma, anemia of chronic disease, DVT, depression, anxiety  Patient is followed by cardiology Fletcher Anon, MD). He was last seen in the cardiology clinic on 05/17/2020; notes reviewed. At the time of his clinic visit, patient complained of SOB and peripheral edema.  Patient denied any angina, PND, orthopnea, palpitations, vertiginous symptoms, or presyncope/syncope.  Cardiac history reviewed.  Patient underwent a multivessel CABG in 2012 for known CAD. He suffered an NSTEMI in  76/2831 that was complicated by his known HFpEF.  At the time of his MI, patient was unable to undergo cardiac catheterization due to overall respiratory status and worsening anemia.  Patient rehospitalized again 02/2020 with worsening angina at which time he underwent heart catheterization that showed significant three-vessel CAD; occluded radial graft to the RCA at the ostium with stenosis of the ostial native RCA, patent LIMA to LAD, and SVG to OM 3.  Catheterization also revealed severe aortic stenosis with a mean gradient of 27 mmHg; valve area (0.87 cm).  Patient with severely elevated filling pressures and moderate pulmonary hypertension.  Staged angioplasty and DES placement to the ostial RCA was performed.  Patient required blood transfusion.  LVEF 30-35%.  Recent coronary CTA revealed severely calcified trileaflet aortic valve with severely restricted leaflet openings; aortic valve calcium score is 2534 consistent with severe aortic stenosis.  Patient scheduled to be seen in consult by the TAVR team within the next month for consideration of valve replacement.  CT imaging also revealed a mild thoracic aortic aneurysm with maximum diameter of 42.5 mm (see below for full interpretation of cardiovascular testing).  Patient formally on DAPT therapy, however secondary to GI bleed, daily ASA was discontinued.  Patient currently on clopidogrel monotherapy.  Hypertension reasonably controlled on currently prescribed regimen.  Patient is on a statin for his HLD.  Patient scheduled to follow-up with outpatient cardiology in 3 months.  Patient scheduled to undergo brachiocephalic AV graft creation on 05/20/2020 with Dr. Hortencia Pilar.  Given patient's complex medical history, which is also significant for cardiac issues, presurgical cardiac clearance was sought by the PAT team.  Per cardiology, "this patient is optimized for surgery and may proceed with the planned procedural course with a MODERATE risk  stratification".  This patient is on daily antiplatelet therapy. I have reached out to vascular surgery Owens Shark, NP) to discuss patient current antiplatelet therapy. Given the fact the patient is on clopidogrel monotherapy, he will not need to hold this medication prior to the procedure.  He denies previous perioperative complications with anesthesia. He underwent a monitored anesthetic course here (ASA IV) in 03/2020 with no documented complications.   Vitals with BMI 05/17/2020 05/12/2020 05/05/2020  Height 5\' 6"  - -  Weight 225 lbs 225 lbs -  BMI 85.46 27.03 -  Systolic 500 938 182  Diastolic 66 90 76  Pulse 88 80 82  Some encounter information is confidential and restricted. Go to Review Flowsheets activity to see all data.    Providers/Specialists:   NOTE: Primary physician provider listed below. Patient may have been seen by APP or partner within same practice.   PROVIDER ROLE LAST Anne Hahn, Dolores Lory, MD Vascular Surgery 04/27/2020  [name], MD Primary Care Provider 04/15/2020  [name], MD Cardiology 05/17/2020   Allergies:  Contrast media [iodinated diagnostic agents], Glipizide, Hydroxychloroquine, Metrizamide, and Penicillins  Current Home Medications:   No current facility-administered medications for this encounter.   [name] acetaminophen (TYLENOL) 500 MG tablet  . albuterol (PROAIR HFA) 108 (90 Base) MCG/ACT inhaler  . alum & mag hydroxide-simeth (MAALOX/MYLANTA) 200-200-20 MG/5ML suspension  . busPIRone (BUSPAR) 10 MG tablet  . calcitRIOL (ROCALTROL) 0.25 MCG capsule  . clopidogrel (PLAVIX) 75 MG tablet  . cyclobenzaprine (FLEXERIL) 10 MG tablet  . Dextran 70-Hypromellose (ARTIFICIAL TEARS) 0.1-0.3 % SOLN  . diphenhydrAMINE (BENADRYL) 25 MG tablet  . finasteride (PROSCAR) 5 MG tablet  . nitroGLYCERIN (NITROSTAT) 0.4 MG SL tablet  . rosuvastatin (CRESTOR) 10 MG tablet  . sertraline (ZOLOFT) 100 MG tablet  . tamsulosin (FLOMAX) 0.4 MG CAPS capsule   . torsemide (DEMADEX) 20 MG tablet  . carvedilol (COREG) 12.5 MG tablet  . CONTOUR TEST test strip  . hydrALAZINE (APRESOLINE) 25 MG tablet  . insulin aspart (NOVOLOG) 100 UNIT/ML injection  . insulin NPH Human (HUMULIN N) 100 UNIT/ML injection  . insulin regular (NOVOLIN R) 100 units/mL injection  . Insulin Syringe-Needle U-100 30G X 15/64" 1 ML MISC  . isosorbide mononitrate (IMDUR) 30 MG 24 hr tablet  . predniSONE (DELTASONE) 50 MG tablet   History:   Past Medical History:  Diagnosis Date  . 3-vessel CAD    8/30 LHC with radial graft to RCA occluded at the ostium with critical stenosis of the ostial native RCA.  . Adenomatous colon polyp   . Allergy   . Anemia   . Anxiety   . Arthritis    RA  . Asthma   . Bell's palsy 2007   neuropathy  . CAD (coronary artery disease)    03/21/2020 LHC with patent LIMA to LAD and SVG to OM 3.  Radial graft to RCA occluded at the ostium with critical stenosis of the ostial and native RCA.  . Carotid artery occlusion    Bilateral carotid stenosis s/p left-sided CEA.  . Cataract    Dr. Dawna Part  . CKD (chronic kidney disease), stage IV ([place])    03/2020 vascular surgery access planned  . Depression   . Diabetes mellitus   . Diverticulosis   . Duodenitis   . DVT (deep venous thrombosis) ([facility])    in leg  . Dyspnea    with edema  . ESRD (end stage renal disease) ([place])   . Gastropathy   .  GERD (gastroesophageal reflux disease)   . Heart failure with preserved ejection fraction ([place])    8/30 RHC with moderately to severely elevated filling pressures and pulmonary wedge pressure 31 mmHg, moderate pulmonary hypertension  . Heart murmur   . Helicobacter pylori gastritis   . Hx of CABG    2012  . Hyperlipidemia   . Hypertension   . Mitral regurgitation and aortic stenosis    RHC 8/30 and echo 12/2019.  [name] Myocardial infarction ([place]) 2021  . Neuropathy   . Obesity   . Peripheral vascular disease ([place])    S/p extensive revascularization by  vascular surgery 05/2019  . Post splenectomy syndrome   . Psoriasis   . Severe aortic stenosis    03/21/20 RHC with peak gradient 27.4 mmHg and valve area 0.87.  [name] Sleep apnea    uses cpap  . Stroke ([place])   . Tobacco use disorder    recently quit  . Tubular adenoma 08/2013   Dr. Hilarie Fredrickson  . Weak urinary stream    Past Surgical History:  Procedure Laterality Date  . CARDIAC CATHETERIZATION  08/2010   LAD: 80% ISR, RCA: 80% ostial, OM 80-90%  . CAROTID ENDARTERECTOMY  08/2010   left/ Dr. Kellie Simmering  . CATARACT EXTRACTION W/PHACO Left 07/09/2017   Procedure: CATARACT EXTRACTION PHACO AND INTRAOCULAR LENS PLACEMENT (IOC);  Surgeon: Birder Robson, MD;  Location: [facility] ORS;  Service: Ophthalmology;  Laterality: Left;  [place] 00:23 AP% 14.2 CDE 3.26 Fluid pack lot # [id number]  . CATARACT EXTRACTION W/PHACO Right 09/10/2017   Procedure: CATARACT EXTRACTION PHACO AND INTRAOCULAR LENS PLACEMENT (IOC);  Surgeon: Birder Robson, MD;  Location: [facility] ORS;  Service: Ophthalmology;  Laterality: Right;  [place] 00:26.4 AP% 17.3 CDE 4.59 Fluid Pack Lot # [id number]  . COLONOSCOPY    . CORONARY ANGIOPLASTY     LAD: before CABG  . CORONARY ARTERY BYPASS GRAFT  09/06/2010   At [facility]: LIMA to LAD, left radial to RCA, sequential SVG to OM3 and 4  . CORONARY STENT INTERVENTION N/A 03/23/2020   Procedure: CORONARY STENT INTERVENTION;  Surgeon: Wellington Hampshire, MD;  Location: [place] CV LAB;  Service: Cardiovascular;  Laterality: N/A;  RCA  . ESOPHAGOGASTRODUODENOSCOPY (EGD) WITH PROPOFOL N/A 04/11/2020   Procedure: ESOPHAGOGASTRODUODENOSCOPY (EGD) WITH PROPOFOL;  Surgeon: Lucilla Lame, MD;  Location: [facility] ENDOSCOPY;  Service: Endoscopy;  Laterality: N/A;  . heart stents  Jan 2011   leg stents 06/2009 and 03/2010  . LOWER EXTREMITY ANGIOGRAPHY Left 06/09/2019   Procedure: LOWER EXTREMITY ANGIOGRAPHY;  Surgeon: Katha Cabal, MD;  Location: [place] CV LAB;  Service: Cardiovascular;  Laterality: Left;  .  PTA of illiac and SFA  multiple   Dr. Ronalee Belts, s/p revision 11/2012  . RIGHT AND LEFT HEART CATH N/A 03/21/2020   Procedure: RIGHT AND LEFT HEART CATH possible percutaneous intervention;  Surgeon: Wellington Hampshire, MD;  Location: [place] CV LAB;  Service: Cardiovascular;  Laterality: N/A;  . RIGHT/LEFT HEART CATH AND CORONARY/GRAFT ANGIOGRAPHY N/A 02/15/2020   Procedure: RIGHT/LEFT HEART CATH AND CORONARY/GRAFT ANGIOGRAPHY;  Surgeon: Wellington Hampshire, MD;  Location: [place] CV LAB;  Service: Cardiovascular;  Laterality: N/A;  . SPLENECTOMY    . VASCULAR SURGERY     LEG STENTS   Family History  Problem Relation Age of Onset  . Lung cancer Father 8  . Arthritis Father   . Breast cancer Sister 29  . Alcoholism Sister   . Dementia Mother   . Alzheimer's disease  Mother   . Arthritis Mother   . Alcoholism Brother   . Epilepsy Sister   . Diabetes Paternal Grandfather   . Colon polyps Paternal Grandfather 72  . Alcoholism Sister   . Alcoholism Sister   . Alcoholism Brother    Social History   Tobacco Use  . Smoking status: Former Smoker    Packs/day: 0.25    Years: 40.00    Pack years: 10.00    Types: Cigarettes  . Smokeless tobacco: Never Used  . Tobacco comment: 1 cigarette a day  Vaping Use  . Vaping Use: Never used  Substance Use Topics  . Alcohol use: No  . Drug use: No    Pertinent Clinical Results:  LABS:    Labs reviewed as follows.   Hgb and Hct low today. PMH (+) for anemia of chronic disease.   Patient transfused with 3 units PRBCs during last hospital admission in 03/2020.   Type and screen updated today.  Results sent to surgeon for review and incorporation into patient's upcoming surgical plan of care.   Hospital Outpatient Visit on 05/18/2020  Component Date Value Ref Range Status  . aPTT 05/18/2020 30  24 - 36 seconds Final   Performed at [facility], [address] [place], [place] [address]  . Sodium 05/18/2020 139  135 -  145 mmol/L Final  . Potassium 05/18/2020 4.9  3.5 - 5.1 mmol/L Final  . Chloride 05/18/2020 105  98 - 111 mmol/L Final  . CO2 05/18/2020 26  22 - 32 mmol/L Final  . Glucose, Bld 05/18/2020 185* 70 - 99 mg/dL Final   Glucose reference range applies only to samples taken after fasting for at least 8 hours.  . BUN 05/18/2020 55* 8 - 23 mg/dL Final  . Creatinine, Ser 05/18/2020 3.25* 0.61 - 1.24 mg/dL Final  . Calcium 05/18/2020 8.5* 8.9 - 10.3 mg/dL Final  . GFR, Estimated 05/18/2020 20* >60 mL/min Final   Comment: (NOTE) Calculated using the CKD-EPI Creatinine Equation (2021)   . Anion gap 05/18/2020 8  5 - 15 Final   Performed at [facility], [address] [place], [place] [address]  . WBC 05/18/2020 8.2  4.0 - 10.5 K/uL Final  . RBC 05/18/2020 2.55* 4.22 - 5.81 MIL/uL Final  . Hemoglobin 05/18/2020 7.5* 13.0 - 17.0 g/dL Final  . HCT 05/18/2020 24.3* 39 - 52 % Final  . MCV 05/18/2020 95.3  80.0 - 100.0 fL Final  . MCH 05/18/2020 29.4  26.0 - 34.0 pg Final  . MCHC 05/18/2020 30.9  30.0 - 36.0 g/dL Final  . RDW 05/18/2020 16.6* 11.5 - 15.5 % Final  . Platelets 05/18/2020 160  150 - 400 K/uL Final  . nRBC 05/18/2020 0.0  0.0 - 0.2 % Final  . Neutrophils Relative % 05/18/2020 69  % Final  . Neutro Abs 05/18/2020 5.7  1.7 - 7.7 K/uL Final  . Lymphocytes Relative 05/18/2020 21  % Final  . Lymphs Abs 05/18/2020 1.7  0.7 - 4.0 K/uL Final  . Monocytes Relative 05/18/2020 7  % Final  . Monocytes Absolute 05/18/2020 0.6  0.1 - 1.0 K/uL Final  . Eosinophils Relative 05/18/2020 2  % Final  . Eosinophils Absolute 05/18/2020 0.2  0.0 - 0.5 K/uL Final  . Basophils Relative 05/18/2020 1  % Final  . Basophils Absolute 05/18/2020 0.0  0.0 - 0.1 K/uL Final  . Immature Granulocytes 05/18/2020 0  % Final  . Abs Immature Granulocytes 05/18/2020 0.03  0.00 - 0.07 K/uL Final   Performed at [facility], [address] [place], [place] [address]  . Prothrombin Time 05/18/2020 13.0   11.4 - 15.2 seconds Final  . INR 05/18/2020 1.0  0.8 - 1.2 Final   Comment: (NOTE) INR goal varies based on device and disease states. Performed at [facility], [address], [place], [place] [address]   . ABO/RH(D) 05/18/2020 AB POS   Final  . Antibody Screen 05/18/2020 NEG   Final  . Sample Expiration 05/18/2020 05/21/2020,2359   Final  . Extend sample reason 05/18/2020    Final                   Value:TRANSFUSED IN PAST 3 MONTHS, UNABLE TO EXTEND Performed at [facility], [address] [place], [place] [address]   . SARS Coronavirus 2 05/18/2020 NEGATIVE  NEGATIVE Final   Comment: (NOTE) SARS-CoV-2 target nucleic acids are NOT DETECTED.  The SARS-CoV-2 RNA is generally detectable in upper and lower respiratory specimens during the acute phase of infection. Negative results do not preclude SARS-CoV-2 infection, do not rule out co-infections with other pathogens, and should not be used as the sole basis for treatment or other patient management decisions. Negative results must be combined with clinical observations, patient history, and epidemiological information. The expected result is Negative.  Fact Sheet for Patients: SugarRoll.be  Fact Sheet for Healthcare Providers: https://www.woods-mathews.com/  This test is not yet approved or cleared by the [place] FDA and  has been authorized for detection and/or diagnosis of SARS-CoV-2 by FDA under an Emergency Use Authorization (EUA). This EUA will remain  in effect (meaning this test can be used) for the duration of the COVID-19 declaration under Se                          ction 564(b)(1) of the Act, 21 U.S.C. section 360bbb-3(b)(1), unless the authorization is terminated or revoked sooner.  Performed at [facility], [place] [address] [place], [place] [address]     ECG: Date: 05/17/2020 Time ECG obtained: 1432 PM Rate: 88 bpm Rhythm:  Sinus  rhythm with first-degree AV block; nonspecific IVCD Axis (leads I and aVF): Left axis deviation Intervals: PR 218 ms. QRS 126 ms. QTc 491 ms. ST segment and T wave changes: No evidence of acute ST segment elevation or depression Comparison: Similar to previous tracing obtained on 04/07/2020   IMAGING / PROCEDURES: CORONARY CT ANGIOGRAPHY performed on 05/05/2020 1. Severely calcified trileaflet aortic valve with severely restricted leaflet openings and no calcifications extending into theLVOT.  2. Aortic valve calcium score is 2534 consistent with severe aortic stenosis.  3. Aortic measurements suitable for delivery of a 29 mm Edwards-SAPIEN 3 valve. 4. Sufficient coronary to annulus distance. 5. There is mild ascending aortic aneurysm with maximum diameter 42.5 mm. There is diffuse atherosclerotic plaque and calcifications without dissection. 6. Optimum Fluoroscopic Angle for Delivery:  RAO 2 CAU 0 7. No thrombus in the left atrial appendage.   ECHOCARDIOGRAM done on 03/22/2020 1. Left ventricular ejection fraction, by estimation, is 30-35%. The left  ventricle has moderately decreased function. The left ventricle demonstrates regional wall motion abnormalities, basal to mid inferior and inferoseptal akinesis, basal to mid inferolateral severe hypokinesis. The left ventricular internal cavity size was mildly dilated. There is mild left ventricular hypertrophy. Left ventricular diastolic parameters are consistent with Grade II diastolic dysfunction (pseudonormalization).  2. Right ventricular  systolic function is moderately reduced. The right ventricular size is normal. There is mildly elevated pulmonary artery systolic pressure. The estimated right ventricular systolic pressure is  35.4 mmHg.  3. Left atrial size was moderately dilated.  4. The mitral valve is degenerative. Moderate to severe mitral valve regurgitation. No evidence of mitral stenosis.  5. The aortic valve is tricuspid. Aortic  valve regurgitation is mild.  6. Moderate aortic valve stenosis. Aortic valve area, by VTI measures 1.08 cm. Aortic valve mean gradient measures 22.0 mmHg.  7. Aortic dilatation noted. There is mild dilatation of the ascending aorta measuring 42 mm.  8. The inferior vena cava is dilated in size with >50% respiratory variability, suggesting right atrial pressure of 8 mmHg.   RIGHT AND LEFT HEART CATHETERIZATION done on 03/21/2020 1. Significant underlying three-vessel coronary artery disease  Ost Cx to Prox Cx lesion is 85% stenosed.  Mid Cx to Dist Cx lesion is 100% stenosed.  1st Mrg lesion is 70% stenosed.  Prox LAD to Mid LAD lesion is 90% stenosed.  Ost RCA to Prox RCA lesion is 99% stenosed.  SVG.  The graft exhibits mild diffuse disease.  Left radial artery graft was visualized by angiography.  Origin lesion is 100% stenosed.  LIMA graft was visualized by non-selective angiography and is normal in caliber.  The graft exhibits no disease. 2. Severe aortic stenosis with peak gradient of 27.4 mmHg and valve area of 0.87 cm. 3. Right heart catheterization showed moderately to severely elevated filling pressures with pulmonary wedge pressure of 31 mmHg, moderate pulmonary hypertension at 50/30 mmHg and normal cardiac output at 5.06 with a cardiac index of 2.36.   4. Prominent V waves on wedge pressure tracing suggestive of significant mitral regurgitation.  Impression and Plan:  [name] [name] has been referred for pre-anesthesia review and clearance prior to him undergoing the planned anesthetic and procedural courses. Available labs, pertinent testing, and imaging results were personally reviewed by me. This patient has been appropriately cleared by cardiology with a moderate risk ratification.   Based on clinical review performed today (05/19/20), barring any significant acute changes in the patient's overall condition, it is anticipated that he will be able to proceed  with the planned surgical intervention. Any acute changes in clinical condition may necessitate his procedure being postponed and/or cancelled. Pre-surgical instructions were reviewed with the patient during his PAT appointment and questions were fielded by PAT clinical staff.  Honor Loh, MSN, APRN, FNP-C, CEN [facility]  Peri-operative Services Nurse Practitioner Phone: [phone] 05/19/20 2:59 PM  NOTE: This note has been prepared using Dragon dictation software. Despite my best ability to proofread, there is always the potential that unintentional transcriptional errors may still occur from this process.

## 2020-05-17 NOTE — Progress Notes (Signed)
Cardiology Office Note   Date:  05/17/2020   ID:  [name] [name], DOB 1955-07-24, MRN [id number]  PCP:  [name], MD  Cardiologist:   [name], MD   Chief Complaint  Patient presents with   Other    6 month follow up - Patient c/o SOB and swelling in legs. Meds reviewed verbally with patient.       History of Present Illness: [name] [name] is a 64 y.o. male who presents for a follow-up visit regarding coronary artery disease and aortic stenosis. He is status post CABG in 2012 for 3 vessel coronary artery disease. He has extensive medical problems that include carotid artery disease status post left carotid endarterectomy , aortic stenosis, tobacco use, obesity, psoriasis, type 2 diabetes, hypertension, hyperlipidemia and peripheral arterial disease which is being followed by Dr. Ronalee Belts.  Most recent echocardiogram in June 2020 showed normal LV systolic function with moderate aortic stenosis and mildly dilated aortic root at 4 cm.  Aortic valve area was 1.28 cm.  Unfortunately, his wife died last year of pancreatic cancer.  The patient has been depressed since then.  He had extensive lower extremity revascularization done in November by Dr. Delana Meyer.    He was hospitalized in July with non-ST elevation myocardial infarction complicated by heart failure.  He was also noted to have worsening anemia.  Given respiratory status and worsening anemia, right and left cardiac catheterization could not be performed.  However, he was rehospitalized in August with worsening angina.  He underwent cardiac catheterization which showed significant three-vessel coronary artery disease with occluded radial graft to the RCA at the ostium with critical stenosis of the ostial native RCA, patent LIMA to LAD and SVG to OM 3.  There was severe aortic stenosis with mean gradient of 27 mmHg and valve area of 0.87 cm.  Right heart catheterization showed severely elevated filling pressures and  moderate pulmonary hypertension.  Vascular access was difficult due to extension of the SFA stents into the common femoral artery.  The patient underwent staged angioplasty and drug-eluting stent placement to the ostial right coronary artery.  He required transfusion.  Echocardiogram in August showed an EF of 30 to 35%. He had worsening heart failure post hospital discharge that improved after adjusting the dose of torsemide. He had CT scan pre-TAVR which showed severely calcified trileaflet aortic valve and suitable anatomy for TAVR. He was evaluated by gastroenterology for GI bleed but has not followed as an outpatient.  He continues to complain of intermittent blood in stool.  He is scheduled for placement of AV fistula for dialysis on Friday.  Past Medical History:  Diagnosis Date   3-vessel CAD    8/30 LHC with radial graft to RCA occluded at the ostium with critical stenosis of the ostial native RCA.   Adenomatous colon polyp    Allergy    Anemia    Anxiety    Arthritis    RA   Asthma    Bell's palsy 2007   neuropathy   CAD (coronary artery disease)    03/21/2020 LHC with patent LIMA to LAD and SVG to OM 3.  Radial graft to RCA occluded at the ostium with critical stenosis of the ostial and native RCA.   Carotid artery occlusion    Bilateral carotid stenosis s/p left-sided CEA.   Cataract    Dr. Dawna Part   CKD (chronic kidney disease), stage IV ([place])    03/2020 vascular surgery access planned  Depression    Diabetes mellitus    Diverticulosis    Duodenitis    DVT (deep venous thrombosis) (HCC)    in leg   Dyspnea    with edema   ESRD (end stage renal disease) (HCC)    Gastropathy    GERD (gastroesophageal reflux disease)    Heart failure with preserved ejection fraction ([place])    8/30 RHC with moderately to severely elevated filling pressures and pulmonary wedge pressure 31 mmHg, moderate pulmonary hypertension   Heart murmur    Helicobacter pylori  gastritis    Hx of CABG    2012   Hyperlipidemia    Hypertension    Mitral regurgitation and aortic stenosis    RHC 8/30 and echo 12/2019.   Myocardial infarction [facility]) 2021   Neuropathy    Obesity    Peripheral vascular disease (HCC)    S/p extensive revascularization by vascular surgery 05/2019   Post splenectomy syndrome    Psoriasis    Severe aortic stenosis    03/21/20 RHC with peak gradient 27.4 mmHg and valve area 0.87.   Sleep apnea    uses cpap   Stroke  [facility])    Tobacco use disorder    recently quit   Tubular adenoma 08/2013   Dr. Elsie Ra urinary stream     Past Surgical History:  Procedure Laterality Date   CARDIAC CATHETERIZATION  08/2010   LAD: 80% ISR, RCA: 80% ostial, OM 80-90%   CAROTID ENDARTERECTOMY  08/2010   left/ Dr. Kellie Simmering   CATARACT EXTRACTION [facility] Left 07/09/2017   Procedure: CATARACT EXTRACTION PHACO AND INTRAOCULAR LENS PLACEMENT ([place]);  Surgeon: Birder Robson, MD;  Location: [facility] ORS;  Service: Ophthalmology;  Laterality: Left;  [place] 00:23 AP% 14.2 CDE 3.26 Fluid pack lot # [id number]   CATARACT EXTRACTION W/PHACO Right 09/10/2017   Procedure: CATARACT EXTRACTION PHACO AND INTRAOCULAR LENS PLACEMENT ([facility]);  Surgeon: Birder Robson, MD;  Location: [facility] ORS;  Service: Ophthalmology;  Laterality: Right;  [place] 00:26.4 AP% 17.3 CDE 4.59 Fluid Pack Lot # [id number]   COLONOSCOPY     CORONARY ANGIOPLASTY     LAD: before CABG   CORONARY ARTERY BYPASS GRAFT  09/06/2010   At [facility]: LIMA to LAD, left radial to RCA, sequential SVG to OM3 and 4   CORONARY STENT INTERVENTION N/A 03/23/2020   Procedure: CORONARY STENT INTERVENTION;  Surgeon: Wellington Hampshire, MD;  Location: [place] CV LAB;  Service: Cardiovascular;  Laterality: N/A;  RCA   ESOPHAGOGASTRODUODENOSCOPY (EGD) WITH PROPOFOL N/A 04/11/2020   Procedure: ESOPHAGOGASTRODUODENOSCOPY (EGD) WITH PROPOFOL;  Surgeon: Lucilla Lame, MD;  Location: [facility] ENDOSCOPY;  Service:  Endoscopy;  Laterality: N/A;   heart stents  Jan 2011   leg stents 06/2009 and 03/2010   LOWER EXTREMITY ANGIOGRAPHY Left 06/09/2019   Procedure: LOWER EXTREMITY ANGIOGRAPHY;  Surgeon: Katha Cabal, MD;  Location: [place] CV LAB;  Service: Cardiovascular;  Laterality: Left;   PTA of illiac and SFA  multiple   Dr. Ronalee Belts, s/p revision 11/2012   RIGHT AND LEFT HEART CATH N/A 03/21/2020   Procedure: RIGHT AND LEFT HEART CATH possible percutaneous intervention;  Surgeon: Wellington Hampshire, MD;  Location: [place] CV [facility];  Service: Cardiovascular;  Laterality: N/A;   RIGHT/LEFT HEART CATH AND CORONARY/GRAFT ANGIOGRAPHY N/A 02/15/2020   Procedure: RIGHT/LEFT HEART CATH AND CORONARY/GRAFT ANGIOGRAPHY;  Surgeon: Wellington Hampshire, MD;  Location: [place] CV LAB;  Service: Cardiovascular;  Laterality: N/A;   SPLENECTOMY  VASCULAR SURGERY     LEG STENTS     Current Outpatient Medications  Medication Sig Dispense Refill   acetaminophen (TYLENOL) 500 MG tablet Take 500 mg by mouth every 6 (six) hours as needed for moderate pain or headache.     albuterol (PROAIR HFA) 108 (90 Base) MCG/ACT inhaler Inhale 2 puffs into the lungs every 6 (six) hours as needed for shortness of breath. 18 g 3   alum & mag hydroxide-simeth (MAALOX/MYLANTA) 200-200-20 MG/5ML suspension Take 30 mLs by mouth every 6 (six) hours as needed for indigestion or heartburn.     busPIRone (BUSPAR) 10 MG tablet TAKE 2 TABLETS(20 MG) BY MOUTH TWICE DAILY (Patient taking differently: Take 20 mg by mouth 2 (two) times daily. ) 360 tablet 3   calcitRIOL (ROCALTROL) 0.25 MCG capsule Take 0.25 mcg by mouth every Monday, Wednesday, and Friday.      carvedilol (COREG) 12.5 MG tablet TAKE ONE TABLET BY MOUTH AT BREAKFAST AND AT BEDTIME 180 tablet 0   clopidogrel (PLAVIX) 75 MG tablet Take 1 tablet (75 mg total) by mouth daily. 30 tablet 11   CONTOUR TEST test strip USE TO check blood sugar ONCE DAILY AS  DIRECTED 100 strip 9   cyclobenzaprine (FLEXERIL) 10 MG tablet Take 1 tablet (10 mg total) by mouth 3 (three) times daily as needed for muscle spasms. 30 tablet 0   Dextran 70-Hypromellose (ARTIFICIAL TEARS) 0.1-0.3 % SOLN Place 1 drop into both eyes 4 (four) times daily as needed (dry eyes).     diphenhydrAMINE (BENADRYL) 25 MG tablet Take 50 mg by mouth daily as needed for allergies.     finasteride (PROSCAR) 5 MG tablet Take 5 mg by mouth daily.     hydrALAZINE (APRESOLINE) 25 MG tablet Take 1 tablet (25 mg total) by mouth every 8 (eight) hours. 90 tablet 2   insulin aspart (NOVOLOG) 100 UNIT/ML injection Inject 0-9 Units into the skin 3 (three) times daily with meals. 10 mL 11   insulin NPH Human (HUMULIN N) 100 UNIT/ML injection Inject 10-25 Units into the skin daily as needed (high blood sugar over 235).     insulin regular (NOVOLIN R) 100 units/mL injection Inject 10-25 Units into the skin daily as needed (high blood sugar over 235).     isosorbide mononitrate (IMDUR) 30 MG 24 hr tablet TAKE THREE TABLETS BY MOUTH ONCE DAILY 90 tablet 0   nitroGLYCERIN (NITROSTAT) 0.4 MG SL tablet DISSOLVE 1 TABLET UNDER THE TONGUE EVERY 5 MINUTES AS NEEDED FOR CHEST PAIN. DO NOT EXCEED A TOTAL OF 3 DOSES IN 15 MINUTES. (Patient taking differently: Place 0.4 mg under the tongue every 5 (five) minutes as needed for chest pain. ) 25 tablet 0   predniSONE (DELTASONE) 50 MG tablet Take 13 hours, seven hours, and 1 hour prior to your CT. 3 tablet 0   rosuvastatin (CRESTOR) 10 MG tablet Take 1 tablet (10 mg total) by mouth daily. 90 tablet 3   sertraline (ZOLOFT) 100 MG tablet Take 1 tablet (100 mg total) by mouth daily. 90 tablet 3   tamsulosin (FLOMAX) 0.4 MG CAPS capsule Take 1 capsule by mouth daily (Patient taking differently: Take 0.4 mg by mouth daily. ) 90 capsule 3   torsemide (DEMADEX) 20 MG tablet Take 2 tablets (40 mg total) by mouth 2 (two) times daily. 360 tablet 1   No current  facility-administered medications for this visit.    Allergies:   Contrast media [iodinated diagnostic agents], Glipizide, Hydroxychloroquine, Metrizamide,  and Penicillins    Social History:  The patient  reports that he has quit smoking. His smoking use included cigarettes. He has a 10.00 pack-year smoking history. He has never used smokeless tobacco. He reports that he does not drink alcohol and does not use drugs.   Family History:  The patient's family history includes Alcoholism in his brother, brother, sister, sister, and sister; Alzheimer's disease in his mother; Arthritis in his father and mother; Breast cancer (age of onset: 63) in his sister; Colon polyps (age of onset: 65) in his paternal grandfather; Dementia in his mother; Diabetes in his paternal grandfather; Epilepsy in his sister; Lung cancer (age of onset: 2) in his father.    ROS:  Please see the history of present illness.   Otherwise, review of systems are positive for none.   All other systems are reviewed and negative.    PHYSICAL EXAM: VS:  BP 126/66 (BP Location: Right Arm, Patient Position: Sitting, Cuff Size: Normal)    Pulse 88    Ht 5\' 6"  (1.676 m)    Wt 225 lb (102.1 kg)    SpO2 98%    BMI 36.32 kg/m  , BMI Body mass index is 36.32 kg/m. GEN: Well nourished, well developed, in no acute distress  HEENT: normal  Neck: no JVD, or masses. Bilateral carotid bruits Cardiac: RRR; no rubs, or gallops,no edema . 3/6 crescendo decrescendo systolic murmur in the aortic area which is mid/late peaking with diminished S2 Respiratory:  clear to auscultation bilaterally, normal work of breathing GI: soft, nontender, nondistended, + BS MS: no deformity or atrophy  Skin: warm and dry, no rash Neuro:  Strength and sensation are intact Psych: euthymic mood, full affect   EKG:  EKG is ordered today. EKG showed sinus rhythm with first-degree AV block, left axis deviation and nonspecific IVCD.    Recent Labs: 03/20/2020: B  Natriuretic Peptide 503.2 03/24/2020: ALT 18 03/25/2020: Magnesium 2.8 04/12/2020: BUN 90; Creatinine, Ser 3.89; Hemoglobin 8.7; Platelets 171; Potassium 3.7; Sodium 136    Lipid Panel    Component Value Date/Time   CHOL 122 02/13/2020 0525   CHOL 116 11/18/2017 1002   TRIG 163 (H) 02/13/2020 0525   HDL 33 (L) 02/13/2020 0525   HDL 30 (L) 11/18/2017 1002   CHOLHDL 3.7 02/13/2020 0525   VLDL 33 02/13/2020 0525   LDLCALC 56 02/13/2020 0525   LDLCALC 51 11/18/2017 1002   LDLDIRECT 61.0 03/15/2017 1021      Wt Readings from Last 3 Encounters:  05/17/20 225 lb (102.1 kg)  05/12/20 225 lb (102.1 kg)  04/27/20 230 lb (104.3 kg)       No flowsheet data found.    ASSESSMENT AND PLAN:  1.  Coronary artery disease involving native coronary arteries with other forms of angina: The patient is status post drug-eluting stent placement to the ostial right coronary artery in September.  He is on clopidogrel monotherapy given recurrent GI bleed.  Currently with no anginal symptoms.  2.  Severe aortic stenosis: CTA showed suitable anatomy for TAVR.  I will reach out to our TAVR team to see him within the next month.  3. Bilateral carotid disease status post left carotid endarterectomy.   He is due for follow-up carotid Doppler which was requested.  4. Essential hypertension:   Blood pressure is reasonably controlled on current medications.  5. Hyperlipidemia: Continue treatment with simvastatin.  I reviewed most recent lipid profile which showed an LDL of 34 and triglyceride  of 103.  6.  Chronic systolic heart failure: EF of 30 to 35%.  Continue treatment with carvedilol, hydralazine and Imdur.  He appears to be euvolemic on current dose of torsemide.  7.  Recurrent GI bleed: He reports some blood in stool and I will have him follow-up with gastroenterology.   Disposition:   FU with me in 3 months  Signed,  [name], MD  05/17/2020 2:40 PM     [facility]

## 2020-05-18 ENCOUNTER — Telehealth: Payer: Self-pay

## 2020-05-18 ENCOUNTER — Encounter
Admission: RE | Admit: 2020-05-18 | Discharge: 2020-05-18 | Disposition: A | Discharge status: Home / Self Care | Payer: Medicare Other | Source: Ambulatory Visit | Attending: Vascular Surgery | Admitting: Vascular Surgery

## 2020-05-18 ENCOUNTER — Other Ambulatory Visit: Admission: RE | Admit: 2020-05-18 | Payer: Medicare Other | Source: Ambulatory Visit

## 2020-05-18 ENCOUNTER — Other Ambulatory Visit: Payer: Self-pay | Admitting: Cardiovascular Disease

## 2020-05-18 ENCOUNTER — Telehealth: Payer: Self-pay | Admitting: Cardiovascular Disease

## 2020-05-18 DIAGNOSIS — Z01812 Encounter for preprocedural laboratory examination: Secondary | ICD-10-CM | POA: Insufficient documentation

## 2020-05-18 DIAGNOSIS — Z20822 Contact with and (suspected) exposure to covid-19: Secondary | ICD-10-CM | POA: Insufficient documentation

## 2020-05-18 DIAGNOSIS — N186 End stage renal disease: Secondary | ICD-10-CM | POA: Insufficient documentation

## 2020-05-18 DIAGNOSIS — R799 Abnormal finding of blood chemistry, unspecified: Secondary | ICD-10-CM | POA: Insufficient documentation

## 2020-05-18 LAB — APTT: aPTT: 30 seconds (ref 24–36)

## 2020-05-18 LAB — CBC WITH DIFFERENTIAL/PLATELET
Abs Immature Granulocytes: 0.03 10*3/uL (ref 0.00–0.07)
Basophils Absolute: 0 10*3/uL (ref 0.0–0.1)
Basophils Relative: 1 %
Eosinophils Absolute: 0.2 10*3/uL (ref 0.0–0.5)
Eosinophils Relative: 2 %
HCT: 24.3 % — ABNORMAL LOW (ref 39.0–52.0)
Hemoglobin: 7.5 g/dL — ABNORMAL LOW (ref 13.0–17.0)
Immature Granulocytes: 0 %
Lymphocytes Relative: 21 %
Lymphs Abs: 1.7 10*3/uL (ref 0.7–4.0)
MCH: 29.4 pg (ref 26.0–34.0)
MCHC: 30.9 g/dL (ref 30.0–36.0)
MCV: 95.3 fL (ref 80.0–100.0)
Monocytes Absolute: 0.6 10*3/uL (ref 0.1–1.0)
Monocytes Relative: 7 %
Neutro Abs: 5.7 10*3/uL (ref 1.7–7.7)
Neutrophils Relative %: 69 %
Platelets: 160 10*3/uL (ref 150–400)
RBC: 2.55 MIL/uL — ABNORMAL LOW (ref 4.22–5.81)
RDW: 16.6 % — ABNORMAL HIGH (ref 11.5–15.5)
WBC: 8.2 10*3/uL (ref 4.0–10.5)
nRBC: 0 % (ref 0.0–0.2)

## 2020-05-18 LAB — BASIC METABOLIC PANEL
Anion gap: 8 (ref 5–15)
BUN: 55 mg/dL — ABNORMAL HIGH (ref 8–23)
CO2: 26 mmol/L (ref 22–32)
Calcium: 8.5 mg/dL — ABNORMAL LOW (ref 8.9–10.3)
Chloride: 105 mmol/L (ref 98–111)
Creatinine, Ser: 3.25 mg/dL — ABNORMAL HIGH (ref 0.61–1.24)
GFR, Estimated: 20 mL/min — ABNORMAL LOW (ref >60)
Glucose, Bld: 185 mg/dL — ABNORMAL HIGH (ref 70–99)
Potassium: 4.9 mmol/L (ref 3.5–5.1)
Sodium: 139 mmol/L (ref 135–145)

## 2020-05-18 LAB — PROTIME-INR
INR: 1 (ref 0.8–1.2)
Prothrombin Time: 13 seconds (ref 11.4–15.2)

## 2020-05-18 LAB — SARS CORONAVIRUS 2 (TAT 6-24 HRS): SARS Coronavirus 2: NEGATIVE

## 2020-05-18 MED ORDER — HYDRALAZINE HCL 25 MG PO TABS
25.0000 mg | ORAL_TABLET | Freq: Three times a day (TID) | ORAL | 2 refills | Status: DC
Start: 2020-05-18 — End: 2020-05-26

## 2020-05-18 NOTE — Telephone Encounter (Signed)
   [facility] Pre-operative Risk Assessment    HEARTCARE STAFF: - Please ensure there is not already an duplicate clearance open for this procedure. - Under Visit Info/Reason for Call, type in Other and utilize the format Clearance MM/DD/YY or Clearance TBD. Do not use dashes or single digits. - If request is for dental extraction, please clarify the # of teeth to be extracted.  Request for surgical clearance:  1. What type of surgery is being performed? AV Fistula graft   2. When is this surgery scheduled? 05-20-20  3. What type of clearance is required (medical clearance vs. Pharmacy clearance to hold med vs. Both)? both  4. Are there any medications that need to be held prior to surgery and how long?plavix   5. Practice name and name of physician performing surgery? [facility]  6. What is the office phone number? [phone] / [facility] [phone]     7.   What is the office fax number?[place]  8.   Anesthesia type (None, local, MAC, general) ? General   Clarisse Gouge 05/18/2020, 2:29 PM  _________________________________________________________________   (provider comments below)

## 2020-05-18 NOTE — Telephone Encounter (Signed)
-----   Message from Burnice Logan, [facility] sent at 05/18/2020  3:29 PM EDT ----- Regarding: Insulin Happy Wednesday,   [name] [name] is requesting refills delivered from [facility] for:   Novolin N  Novolin R  15/64 length 1 ML needle to inject insulin  He indicates that he is unable to see Endocrinology and Dr. [name] was willing to refill his insulin for him.   Would it be possible to send these prescriptions in to [facility]?  Thanks,  Emeterio Reeve

## 2020-05-18 NOTE — Progress Notes (Signed)
Chronic Care Management Pharmacy Assistant   Name: [name] [name]  MRN: [id number] DOB: 1955-12-17  Reason for Encounter: Medication Review/ Refill Request/Monthly Dispensing Call.  Patient Questions:  1.  Have you seen any other providers since your last visit? Yes, 10/06/21[name], NP (Vascular Surgery), 05/02/2020- Dr [name] [name] (Podiatry),05/04/2020- Dr. Posey [name] (Rheumatology) Remicade Infusion,  05/05/2020- Dr [name] [name] (Nephrology), 05/12/2020- [name] [name], 05/17/2020- Dr. Fletcher [name] (Cardiology).   2.  Any changes in your medicines or health? No  PCP : [name] Carbon, MD  Allergies:   Allergies  Allergen Reactions  . Contrast Media [Iodinated Diagnostic Agents] Rash and Other (See Comments)    Got very hot and red   . Glipizide Other (See Comments)    ANTIDIABETICS. Burning  . Hydroxychloroquine Other (See Comments)    Stomach upset  . Metrizamide Other (See Comments)    Got very hot and red  . Penicillins Hives and Swelling    Has patient had a PCN reaction causing immediate rash, facial/tongue/throat swelling, SOB or lightheadedness with hypotension: yes Has patient had a PCN reaction causing severe rash involving mucus membranes or skin necrosis: no  Has patient had a PCN reaction that required hospitalization: yes Has patient had a PCN reaction occurring within the last 10 years: no If all of the above answers are "NO", then may proceed with Cephalosporin use.     Medications: Outpatient Encounter Medications as of 05/11/2020  Medication Sig  . acetaminophen (TYLENOL) 500 MG tablet Take 500 mg by mouth every 6 (six) hours as needed for moderate pain or headache.  . albuterol (PROAIR HFA) 108 (90 Base) MCG/ACT inhaler Inhale 2 puffs into the lungs every 6 (six) hours as needed for shortness of breath.  [name] alum & mag hydroxide-simeth (MAALOX/MYLANTA) 200-200-20 MG/5ML suspension Take 30 mLs by mouth every 6 (six) hours as needed for indigestion or  heartburn.  . busPIRone (BUSPAR) 10 MG tablet TAKE 2 TABLETS(20 MG) BY MOUTH TWICE DAILY (Patient taking differently: Take 20 mg by mouth 2 (two) times daily. )  . calcitRIOL (ROCALTROL) 0.25 MCG capsule Take 0.25 mcg by mouth every Monday, Wednesday, and Friday.   . clopidogrel (PLAVIX) 75 MG tablet Take 1 tablet (75 mg total) by mouth daily.  . CONTOUR TEST test strip USE TO check blood sugar ONCE DAILY AS DIRECTED  . cyclobenzaprine (FLEXERIL) 10 MG tablet Take 1 tablet (10 mg total) by mouth 3 (three) times daily as needed for muscle spasms.  . Dextran 70-Hypromellose (ARTIFICIAL TEARS) 0.1-0.3 % SOLN Place 1 drop into both eyes 4 (four) times daily as needed (dry eyes).  . diphenhydrAMINE (BENADRYL) 25 MG tablet Take 50 mg by mouth daily as needed for allergies.  . finasteride (PROSCAR) 5 MG tablet Take 5 mg by mouth daily.  . hydrALAZINE (APRESOLINE) 25 MG tablet Take 1 tablet (25 mg total) by mouth every 8 (eight) hours.  . insulin aspart (NOVOLOG) 100 UNIT/ML injection Inject 0-9 Units into the skin 3 (three) times daily with meals.  . insulin NPH Human (HUMULIN N) 100 UNIT/ML injection Inject 10-25 Units into the skin daily as needed (high blood sugar over 235).  . insulin regular (NOVOLIN R) 100 units/mL injection Inject 10-25 Units into the skin daily as needed (high blood sugar over 235).  . nitroGLYCERIN (NITROSTAT) 0.4 MG SL tablet DISSOLVE 1 TABLET UNDER THE TONGUE EVERY 5 MINUTES AS NEEDED FOR CHEST PAIN. DO NOT EXCEED A TOTAL OF 3 DOSES IN 15 MINUTES. (  Patient taking differently: Place 0.4 mg under the tongue every 5 (five) minutes as needed for chest pain. )  . predniSONE (DELTASONE) 50 MG tablet Take 13 hours, seven hours, and 1 hour prior to your CT.  . rosuvastatin (CRESTOR) 10 MG tablet Take 1 tablet (10 mg total) by mouth daily.  . sertraline (ZOLOFT) 100 MG tablet Take 1 tablet (100 mg total) by mouth daily.  . tamsulosin (FLOMAX) 0.4 MG CAPS capsule Take 1 capsule by mouth  daily (Patient taking differently: Take 0.4 mg by mouth daily. )  . torsemide (DEMADEX) 20 MG tablet Take 2 tablets (40 mg total) by mouth 2 (two) times daily.  . [DISCONTINUED] carvedilol (COREG) 12.5 MG tablet Take 1 tablet (12.5 mg total) by mouth 2 (two) times daily. (Patient taking differently: Take 12.5 mg by mouth 2 (two) times daily with a meal. )  . [DISCONTINUED] isosorbide mononitrate (IMDUR) 30 MG 24 hr tablet Take 3 tablets (90 mg total) by mouth daily.   No facility-administered encounter medications on file as of 05/11/2020.    Current Diagnosis: Patient Active Problem List   Diagnosis Date Noted  . Demand ischemia ([place])   . Acute blood loss anemia 04/09/2020  . Chronic combined systolic and diastolic CHF (congestive heart failure) ([place]) 04/09/2020  . CKD (chronic kidney disease), stage IV ([place]) 04/09/2020  . Depression 04/09/2020  . Aortic stenosis 04/09/2020  . Elevated troponin I level 04/09/2020  . Anemia in chronic kidney disease 04/07/2020  . Ischemic cardiomyopathy 03/25/2020  . Non-ST elevation (NSTEMI) myocardial infarction ([place]) 03/23/2020  . CAD (coronary artery disease) 03/22/2020  . Chronic heart failure with preserved ejection fraction (HFpEF) ([place]) 03/21/2020  . Hx of CABG 03/21/2020  . Anemia 03/21/2020  . Unstable angina ([place]) 03/20/2020  . Chronic kidney disease   . History of non-ST elevation myocardial infarction (NSTEMI) 02/13/2020  . Moderate aortic valve stenosis 02/13/2020  . Acute on chronic combined systolic and diastolic CHF (congestive heart failure) ([place]) 02/13/2020  . Hypertensive urgency 02/13/2020  . Fall with injury 11/16/2019  . Leg weakness, bilateral 11/16/2019  . Benign hypertensive kidney disease with chronic kidney disease 09/02/2019  . Proteinuria 09/02/2019  . Secondary hyperparathyroidism of renal origin ([name] [name]) 09/02/2019  . Left arm pain 02/11/2019  . Gastroesophageal reflux 03/25/2018  . Atherosclerosis of aorta ([name] [name])  09/04/2017  . Thoracic aortic aneurysm ([name] [name]) 09/04/2017  . Right thyroid nodule 04/12/2017  . Thrombocytopenia ([name] [name]) 03/16/2017  . CKD stage 4 due to type 2 diabetes mellitus ([place]) 03/15/2017  . Advance directive discussed with patient 03/15/2017  . BMI 40.0-44.9, adult ([place]) 09/12/2016  . Psoriatic arthritis ([place])   . Lung nodule < 6cm on CT 03/29/2016  . COPD with acute bronchitis ([place]) 03/29/2016  . Headache 09/14/2015  . Constipation 10/05/2014  . PVD (peripheral vascular disease) ([place]) 04/23/2014  . Memory loss 04/23/2014  . Preventative health care 11/23/2013  . Benign prostatic hypertrophy without urinary obstruction 03/25/2013  . Enlarged prostate without lower urinary tract symptoms (luts) 03/25/2013  . Peripheral vascular disease due to secondary diabetes mellitus ([place]) 03/25/2013  . Obesity 02/12/2013  . Atherosclerotic heart disease of native coronary artery with angina pectoris ([place]) 06/06/2012  . DM (diabetes mellitus), type 2 with complications ([place]) 74/25/9563  . Severe aortic stenosis   . Low back pain 12/27/2011  . MDD (major depressive disorder), recurrent episode ([name]) 05/28/2011  . Carotid artery disease ([place]) 04/17/2011  . Occlusion and stenosis of carotid artery 04/17/2011  . Obstructive  sleep apnea 03/05/2011  . Psoriasis 03/05/2011  . Type 2 diabetes mellitus with diabetic peripheral angiopathy without gangrene ([place]) 03/05/2011  . Polyneuropathy 03/05/2011  . 3-vessel CAD   . Hyperlipidemia, mixed   . Essential hypertension    Reviewed chart for medication changes ahead of medication coordination call.  OVs & Consults- 04/27/20- [name] Ditch, NP (Vascular Surgery), 05/02/2020- Dr [name] [name] (Podiatry),05/04/2020- Dr. Posey [name] (Rheumatology) Remicade Infusion,  05/05/2020- Dr [name] [name] (Nephrology), 05/12/2020- [place] [name], 05/17/2020- Dr. Fletcher [name] (Cardiology), since last care coordination call/Pharmacist visit.  No medication changes indicated.  BP  Readings from Last 3 Encounters:  05/17/20 126/66  05/12/20 118/90  05/05/20 125/65    Lab Results  Component Value Date   HGBA1C 5.7 (H) 02/13/2020     Patient obtains medications through Vials  30 Days   Last adherence delivery included:  Carvedilol 12.5 mg- 1 tablet twice daily, Sertraline 100 mg- 1 tablet daily, Buspirone 1 mg- 2 tablets twice daily, Finasteride 5 mg- 1 tablet daily, Nitroglycerin 0.4 mg- 1 tablet under tongue every 5 minutes as needed for chest pains, not to exceed 3 doses in 15 minutes,  Prednisone 50 mg- 1 tablet daily as directed, Rosuvastatin 10 mg- 1 tablet daily, Maalox- Use as directed, Clopidogrel 75 mg- 1 tablet daily, Isosorbide Mononitrate 30 mg- 3 tablets once daily and Maalox.  Patient declined the following medication last month: Torsemide 20 mg -2 tablets by mouth twice daily(sent 9/27 for a 90 DS); Cyclobenzaprine 10 mg - 1 tablet three times a day as needed for muscle spasms(sent 9/24 for 10 DS use prn); Dulcolax 1200 mg- use as directed( Patient needs Maalox instead); Hydralazine 25 mg- 1 tablet every 8 hours( received a Rx from [facility] 03/25/20 #90, but now he is taking 2 tablets daily, has #30 on hand); Tamsulosin 0.4 mg- 1 tablet daily (still has a #30 on hand, was taking prn).  Patient is due for next adherence delivery on:05/20/2020. Called patient and reviewed medications and coordinated delivery.  This delivery to include: Carvedilol 12.5 mg- 1 tablet twice daily, Sertraline 100 mg- 1 tablet daily, Buspirone 1 mg- 2 tablets twice daily, Finasteride 5 mg- 1 tablet daily,  Rosuvastatin 10 mg- 1 tablet daily, Clopidogrel 75 mg- 1 tablet daily, Isosorbide Mononitrate 30 mg- 3 tablets once daily, Cyclobenzaprine 10 mg - 1 tablet three times a day as needed for muscle spasms, Tamsulosin 0.4 mg- 1 tablet daily, Accu-check fastclix lancets- use to check blood sugars once daily, Accu-check test strips- use to check blood sugars once daily, Novolin R- Inject  10-25 units total into skin daily as needed for blood sugars over 235 and Novolin N- Inject 10-25 units into skin daily as needed for blood sugars over 235, Insulin syringe needles U-100 30G x 15/64" 1 ml- use to inject insulin as directed.  Short supply not needed. Patient needed Hydralazine 25 mg- 1 tablet every 8 hours, delivered 05/19/2020.   Patient declined the following medications: Nitroglycerin 0.4 mg- 1 tablet under tongue every 5 minutes as needed for chest pains, not to exceed 3 doses in 15 minutes due to receiving a refill on 04/25/20 and PRN use. Prednisone 50 mg declined due to completion of therapy. Patient declined Dulcolax 1200 mg- use as directed due to PRN use and Torsemide 20 mg -2 tablets by mouth twice daily due to having an adequate supply last refilled on  04/18/2020 for 90 day supply.   Patient needs refills for  Carvedilol 12.5 mg- 1 tablet  twice daily, Isosorbide Mononitrate 30 mg- 3 tablets once daily, and Cyclobenzaprine 10 mg - 1 tablet three times a day as needed for muscle spasms.  Confirmed delivery date of 05/20/2020, advised patient that pharmacy will contact them the morning of delivery.  Follow-Up:  Care Coordination with Outside Provider, Coordination of Enhanced Pharmacy Services and Pharmacist Review   05/18/2020-Called patient to review medications for delivery and to discuss a few medications that was to be handled a week ago. Patient is needing new prescriptions or his insulin- Novolin R & N, he is using expired insulin due to blood sugars getting over 500's and then having to use glucose tablets to bring blood sugars up after dropping in the 30's. Patient also had not received Hydralazine 25 mg that was requested. Called patient's cardiology office Dr Rogue Jury to request refill sent to [facility]. Patient inquired if Dr Kara Pacer would send in refills of his insulin, his Endocrinologist Dr Renne Crigler needs patient to follow up but he is unable to drive all the  way to [facility] for a visit. Advised patient that I will call office to schedule a telephone visit and follow up with PharmD Donette Larry to see if Dr Kara Pacer would send in medications for diabetes. Called Dr Renne Crigler office, scheduled patient a telephone visit for 07/04/2020 @ 10:20 am. Patient aware Hydralazine was sent and will be delivered on  05/19/2020.  05/19/2020- Called patient back to inform that Dr Kara Pacer agreed to send over diabetes medications for 1 year, medications sent to [facility]. Patient is having surgery 05/20/2020 but aware medications will be delivered on 05/20/2020 and he has given permission to have step-son Waunita Schooner receive them. Donette Larry, CPP aware.  Pattricia Boss, [place] Pharmacist Assistant [phone]

## 2020-05-18 NOTE — Telephone Encounter (Signed)
Rx request sent to pharmacy.  

## 2020-05-18 NOTE — Telephone Encounter (Signed)
*  STAT* If patient is at the pharmacy, call can be transferred to refill team.   1. Which medications need to be refilled? (please list name of each medication and dose if known) hydralazine 25 MG 1 tablet every 8 hours  2. Which pharmacy/location (including street and city if local pharmacy) is medication to be sent to? [facility]   3. Do they need a 30 day or 90 day supply? 90 day

## 2020-05-18 NOTE — Telephone Encounter (Signed)
I will confirm with Dr [name] that he wants to take over his insulin rx.

## 2020-05-18 NOTE — Progress Notes (Signed)
  [facility] Perioperative Services: Pre-Admission/Anesthesia Testing  Abnormal Lab Notification   Date: 05/18/20  Name: [name] [name] MRN:   [id number]  Re: Abnormal labs noted during PAT appointment   Provider(s) Notified: Delana Meyer Dolores Lory, MD Notification mode: Routed and/or faxed via [place] LAB VALUE(S): Lab Results  Component Value Date   HGB 7.5 (L) 05/18/2020   HCT 24.3 (L) 05/18/2020   Notes: This patient has an ESRD diagnosis. He is scheduled for a AV fistula creation on 05/20/2020.  Patient recently admitted to [facility] from 04/09/2020 through 04/12/2020, during which time he was transfused with 3 units PRBCs.  Pre-transfusion hemoglobin was 6.5 g/dL. Patient's Hgb (post transfusional) was 8.7 g/dL at his time of discharge from the hospital. This is a courtesy FYI; no formal response is required.  Honor Loh, MSN, APRN, FNP-C, CEN [facility]  Peri-operative Services Nurse Practitioner Phone: [phone] 05/18/20 1:24 PM

## 2020-05-19 ENCOUNTER — Telehealth: Payer: Self-pay | Admitting: Cardiovascular Disease

## 2020-05-19 ENCOUNTER — Telehealth: Payer: Self-pay

## 2020-05-19 ENCOUNTER — Other Ambulatory Visit: Payer: Self-pay | Admitting: Cardiovascular Disease

## 2020-05-19 MED ORDER — "INSULIN SYRINGE-NEEDLE U-100 30G X 15/64"" 1 ML MISC": 11 refills | Status: DC

## 2020-05-19 MED ORDER — INSULIN REGULAR HUMAN 100 UNIT/ML IJ SOLN
10.0000 [IU] | Freq: Every day | INTRAMUSCULAR | 11 refills | Status: DC | PRN
Start: 2020-05-19 — End: 2020-06-15

## 2020-05-19 MED ORDER — CARVEDILOL 12.5 MG PO TABS
ORAL_TABLET | ORAL | 0 refills | Status: DC
Start: 2020-05-19 — End: 2020-05-26

## 2020-05-19 MED ORDER — CYCLOBENZAPRINE HCL 10 MG PO TABS: 10.0000 mg | ORAL_TABLET | Freq: Three times a day (TID) | ORAL | 0 refills | Status: DC | PRN

## 2020-05-19 MED ORDER — INSULIN NPH (HUMAN) (ISOPHANE) 100 UNIT/ML ~~LOC~~ SUSP
10.0000 [IU] | Freq: Every day | SUBCUTANEOUS | 11 refills | Status: DC | PRN
Start: 2020-05-19 — End: 2020-06-01

## 2020-05-19 MED ORDER — ISOSORBIDE MONONITRATE ER 30 MG PO TB24
30.0000 mg | ORAL_TABLET | Freq: Every day | ORAL | 0 refills | Status: DC
Start: 2020-05-19 — End: 2020-06-15

## 2020-05-19 NOTE — Telephone Encounter (Signed)
   Primary Cardiologist: [name], MD  Chart reviewed as part of pre-operative protocol coverage. Discussed with Dr. Fletcher Anon who states patient is moderate risk for planned procedure and can proceed without further cardiovascular testing.   Patient updated to these recommendations.   I will route this recommendation to the requesting party via Epic fax function and remove from pre-op pool.  Please call with questions.  Abigail Butts, PA-C 05/19/2020, 2:51 PM

## 2020-05-19 NOTE — Telephone Encounter (Signed)
Yes---I will take over the insulin and you can send in for a year

## 2020-05-19 NOTE — Telephone Encounter (Signed)
-----   Message from Burnice Logan, [facility] sent at 05/19/2020  3:39 PM EDT ----- Regarding: Refill Request Good afternoon  Mr. Wuertz is requesting a refill of cyclobenzaprine sent to [facility] for delivery please.   Thank you!

## 2020-05-19 NOTE — Telephone Encounter (Signed)
Rx request sent to pharmacy.  

## 2020-05-19 NOTE — Telephone Encounter (Signed)
Patient returning missed call and he is not sure what it was about . Please call if needing to discuss.

## 2020-05-19 NOTE — Telephone Encounter (Signed)
   Primary Cardiologist: [name], MD  Notified by Honor Loh, NP with vascular that patient does NOT need to hold plavix. Await input from Dr. Fletcher Anon on clearance given recent visit and inability to complete 4 METs. Will route final recommendations to the requesting surgeons office.   Abigail Butts, PA-C 05/19/2020, 12:36 PM

## 2020-05-19 NOTE — Telephone Encounter (Signed)
   Primary Cardiologist: [name], MD  Chart reviewed as part of pre-operative protocol coverage.   Dr. Fletcher Anon - this patient has an extensive cardiovascular history including CAD s/p CABG in 2012 with recent PCI/DES to ostial RCA 03/23/2020 currently on plavix monotherapy given hx of GIB, severe aortic stenosis with anticipated TAVR in the coming months, chronic combined CHF, bilateral carotid artery disease s/p left CEA, HTN, HLD, DM type 2,CKD, and recurrent GI bleeds. He is anticipating AV fistula placement for progressive CKD.   You evaluated this patient 05/17/20 and looks like you discussed this upcoming procedure. We are being asked to address his preop status.   I contacted the patient this morning. He is unable to complete 4 METs though has no resting chest pain or SOB complaints at this time. They would like to hold plavix 5 days prior to his procedure. He tells me he has not interrupted this medication at this time.   Can you comment on his preoperative status and recommendations for holding plavix given recent PCI? What is the earliest you would feel comfortable with interrupting his plavix monotherapy?  Please route your response back to P CV DIV PREOP.  Thank you!  Abigail Butts, PA-C 05/19/20; 10:46 AM

## 2020-05-19 NOTE — Telephone Encounter (Signed)
*  STAT* If patient is at the pharmacy, call can be transferred to refill team.   1. Which medications need to be refilled? (please list name of each medication and dose if known) carvedilol, isosorbide  2. Which pharmacy/location (including street and city if local pharmacy) is medication to be sent to? [facility]  3. Do they need a 30 day or 90 day supply? [place]

## 2020-05-19 NOTE — Telephone Encounter (Signed)
Patient returning call re scheduling a procedure .  Not sure if related to vascular or cardiology

## 2020-05-19 NOTE — Telephone Encounter (Signed)
Rx done. 

## 2020-05-19 NOTE — Telephone Encounter (Signed)
I did not call the patient.

## 2020-05-20 ENCOUNTER — Other Ambulatory Visit: Payer: Self-pay

## 2020-05-20 ENCOUNTER — Encounter
Admission: RE | Disposition: A | Discharge status: Home / Self Care | Payer: Self-pay | Source: Home / Self Care | Attending: Internal Medicine

## 2020-05-20 ENCOUNTER — Ambulatory Visit: Payer: Medicare Other

## 2020-05-20 ENCOUNTER — Encounter: Payer: Self-pay | Admitting: Vascular Surgery

## 2020-05-20 ENCOUNTER — Inpatient Hospital Stay
Admission: RE | Admit: 2020-05-20 | Discharge: 2020-05-26 | DRG: 264 | Disposition: A | Discharge status: Home Health | Payer: Medicare Other | Attending: Internal Medicine | Admitting: Internal Medicine

## 2020-05-20 DIAGNOSIS — I770 Arteriovenous fistula, acquired: Secondary | ICD-10-CM

## 2020-05-20 DIAGNOSIS — K922 Gastrointestinal hemorrhage, unspecified: Secondary | ICD-10-CM | POA: Diagnosis present

## 2020-05-20 DIAGNOSIS — R0602 Shortness of breath: Secondary | ICD-10-CM | POA: Diagnosis not present

## 2020-05-20 DIAGNOSIS — D631 Anemia in chronic kidney disease: Secondary | ICD-10-CM | POA: Diagnosis not present

## 2020-05-20 DIAGNOSIS — Z91041 Radiographic dye allergy status: Secondary | ICD-10-CM

## 2020-05-20 DIAGNOSIS — Z794 Long term (current) use of insulin: Secondary | ICD-10-CM

## 2020-05-20 DIAGNOSIS — I130 Hypertensive heart and chronic kidney disease with heart failure and stage 1 through stage 4 chronic kidney disease, or unspecified chronic kidney disease: Secondary | ICD-10-CM | POA: Diagnosis present

## 2020-05-20 DIAGNOSIS — D696 Thrombocytopenia, unspecified: Secondary | ICD-10-CM | POA: Diagnosis present

## 2020-05-20 DIAGNOSIS — Z880 Allergy status to penicillin: Secondary | ICD-10-CM

## 2020-05-20 DIAGNOSIS — F419 Anxiety disorder, unspecified: Secondary | ICD-10-CM | POA: Diagnosis present

## 2020-05-20 DIAGNOSIS — I5042 Chronic combined systolic (congestive) and diastolic (congestive) heart failure: Secondary | ICD-10-CM

## 2020-05-20 DIAGNOSIS — Z9081 Acquired absence of spleen: Secondary | ICD-10-CM

## 2020-05-20 DIAGNOSIS — I4581 Long QT syndrome: Secondary | ICD-10-CM | POA: Diagnosis not present

## 2020-05-20 DIAGNOSIS — Z951 Presence of aortocoronary bypass graft: Secondary | ICD-10-CM

## 2020-05-20 DIAGNOSIS — I080 Rheumatic disorders of both mitral and aortic valves: Secondary | ICD-10-CM | POA: Diagnosis present

## 2020-05-20 DIAGNOSIS — I120 Hypertensive chronic kidney disease with stage 5 chronic kidney disease or end stage renal disease: Secondary | ICD-10-CM | POA: Diagnosis not present

## 2020-05-20 DIAGNOSIS — I2510 Atherosclerotic heart disease of native coronary artery without angina pectoris: Secondary | ICD-10-CM | POA: Diagnosis present

## 2020-05-20 DIAGNOSIS — I350 Nonrheumatic aortic (valve) stenosis: Secondary | ICD-10-CM | POA: Diagnosis not present

## 2020-05-20 DIAGNOSIS — Z820 Family history of epilepsy and other diseases of the nervous system: Secondary | ICD-10-CM

## 2020-05-20 DIAGNOSIS — E1122 Type 2 diabetes mellitus with diabetic chronic kidney disease: Secondary | ICD-10-CM | POA: Diagnosis present

## 2020-05-20 DIAGNOSIS — I25118 Atherosclerotic heart disease of native coronary artery with other forms of angina pectoris: Secondary | ICD-10-CM | POA: Diagnosis not present

## 2020-05-20 DIAGNOSIS — D62 Acute posthemorrhagic anemia: Secondary | ICD-10-CM | POA: Diagnosis present

## 2020-05-20 DIAGNOSIS — Z86718 Personal history of other venous thrombosis and embolism: Secondary | ICD-10-CM

## 2020-05-20 DIAGNOSIS — Z8261 Family history of arthritis: Secondary | ICD-10-CM

## 2020-05-20 DIAGNOSIS — Z20822 Contact with and (suspected) exposure to covid-19: Secondary | ICD-10-CM | POA: Diagnosis present

## 2020-05-20 DIAGNOSIS — E1142 Type 2 diabetes mellitus with diabetic polyneuropathy: Secondary | ICD-10-CM | POA: Diagnosis present

## 2020-05-20 DIAGNOSIS — Z6837 Body mass index (BMI) 37.0-37.9, adult: Secondary | ICD-10-CM

## 2020-05-20 DIAGNOSIS — M62838 Other muscle spasm: Secondary | ICD-10-CM | POA: Diagnosis present

## 2020-05-20 DIAGNOSIS — Z419 Encounter for procedure for purposes other than remedying health state, unspecified: Secondary | ICD-10-CM

## 2020-05-20 DIAGNOSIS — G4733 Obstructive sleep apnea (adult) (pediatric): Secondary | ICD-10-CM | POA: Diagnosis present

## 2020-05-20 DIAGNOSIS — Z833 Family history of diabetes mellitus: Secondary | ICD-10-CM

## 2020-05-20 DIAGNOSIS — I5043 Acute on chronic combined systolic (congestive) and diastolic (congestive) heart failure: Secondary | ICD-10-CM | POA: Diagnosis present

## 2020-05-20 DIAGNOSIS — I129 Hypertensive chronic kidney disease with stage 1 through stage 4 chronic kidney disease, or unspecified chronic kidney disease: Secondary | ICD-10-CM | POA: Diagnosis not present

## 2020-05-20 DIAGNOSIS — I5023 Acute on chronic systolic (congestive) heart failure: Secondary | ICD-10-CM | POA: Diagnosis not present

## 2020-05-20 DIAGNOSIS — F32A Depression, unspecified: Secondary | ICD-10-CM | POA: Diagnosis present

## 2020-05-20 DIAGNOSIS — I255 Ischemic cardiomyopathy: Secondary | ICD-10-CM | POA: Diagnosis present

## 2020-05-20 DIAGNOSIS — N184 Chronic kidney disease, stage 4 (severe): Secondary | ICD-10-CM | POA: Diagnosis not present

## 2020-05-20 DIAGNOSIS — M79632 Pain in left forearm: Secondary | ICD-10-CM | POA: Diagnosis not present

## 2020-05-20 DIAGNOSIS — E669 Obesity, unspecified: Secondary | ICD-10-CM | POA: Diagnosis present

## 2020-05-20 DIAGNOSIS — E1151 Type 2 diabetes mellitus with diabetic peripheral angiopathy without gangrene: Principal | ICD-10-CM

## 2020-05-20 DIAGNOSIS — N400 Enlarged prostate without lower urinary tract symptoms: Secondary | ICD-10-CM | POA: Diagnosis present

## 2020-05-20 DIAGNOSIS — Z8673 Personal history of transient ischemic attack (TIA), and cerebral infarction without residual deficits: Secondary | ICD-10-CM

## 2020-05-20 DIAGNOSIS — Z801 Family history of malignant neoplasm of trachea, bronchus and lung: Secondary | ICD-10-CM

## 2020-05-20 DIAGNOSIS — Z7902 Long term (current) use of antithrombotics/antiplatelets: Secondary | ICD-10-CM

## 2020-05-20 DIAGNOSIS — N2581 Secondary hyperparathyroidism of renal origin: Secondary | ICD-10-CM | POA: Diagnosis present

## 2020-05-20 DIAGNOSIS — D6489 Other specified anemias: Secondary | ICD-10-CM

## 2020-05-20 DIAGNOSIS — E785 Hyperlipidemia, unspecified: Secondary | ICD-10-CM | POA: Diagnosis present

## 2020-05-20 DIAGNOSIS — K5900 Constipation, unspecified: Secondary | ICD-10-CM | POA: Diagnosis present

## 2020-05-20 DIAGNOSIS — K219 Gastro-esophageal reflux disease without esophagitis: Secondary | ICD-10-CM | POA: Diagnosis present

## 2020-05-20 DIAGNOSIS — Z955 Presence of coronary angioplasty implant and graft: Secondary | ICD-10-CM

## 2020-05-20 DIAGNOSIS — Z87891 Personal history of nicotine dependence: Secondary | ICD-10-CM

## 2020-05-20 DIAGNOSIS — N186 End stage renal disease: Secondary | ICD-10-CM

## 2020-05-20 DIAGNOSIS — E1129 Type 2 diabetes mellitus with other diabetic kidney complication: Secondary | ICD-10-CM | POA: Diagnosis present

## 2020-05-20 DIAGNOSIS — Z888 Allergy status to other drugs, medicaments and biological substances status: Secondary | ICD-10-CM | POA: Diagnosis not present

## 2020-05-20 DIAGNOSIS — E118 Type 2 diabetes mellitus with unspecified complications: Secondary | ICD-10-CM | POA: Diagnosis present

## 2020-05-20 DIAGNOSIS — M069 Rheumatoid arthritis, unspecified: Secondary | ICD-10-CM | POA: Diagnosis present

## 2020-05-20 DIAGNOSIS — Z803 Family history of malignant neoplasm of breast: Secondary | ICD-10-CM

## 2020-05-20 DIAGNOSIS — E876 Hypokalemia: Secondary | ICD-10-CM | POA: Diagnosis not present

## 2020-05-20 DIAGNOSIS — E11649 Type 2 diabetes mellitus with hypoglycemia without coma: Secondary | ICD-10-CM | POA: Diagnosis present

## 2020-05-20 DIAGNOSIS — I10 Essential (primary) hypertension: Secondary | ICD-10-CM | POA: Diagnosis not present

## 2020-05-20 DIAGNOSIS — K625 Hemorrhage of anus and rectum: Secondary | ICD-10-CM | POA: Diagnosis not present

## 2020-05-20 DIAGNOSIS — J90 Pleural effusion, not elsewhere classified: Secondary | ICD-10-CM | POA: Diagnosis not present

## 2020-05-20 DIAGNOSIS — Z79899 Other long term (current) drug therapy: Secondary | ICD-10-CM

## 2020-05-20 DIAGNOSIS — J449 Chronic obstructive pulmonary disease, unspecified: Secondary | ICD-10-CM | POA: Diagnosis present

## 2020-05-20 DIAGNOSIS — I252 Old myocardial infarction: Secondary | ICD-10-CM

## 2020-05-20 DIAGNOSIS — N189 Chronic kidney disease, unspecified: Secondary | ICD-10-CM | POA: Diagnosis present

## 2020-05-20 DIAGNOSIS — K648 Other hemorrhoids: Secondary | ICD-10-CM | POA: Diagnosis present

## 2020-05-20 DIAGNOSIS — I132 Hypertensive heart and chronic kidney disease with heart failure and with stage 5 chronic kidney disease, or end stage renal disease: Secondary | ICD-10-CM | POA: Diagnosis not present

## 2020-05-20 DIAGNOSIS — G8918 Other acute postprocedural pain: Secondary | ICD-10-CM | POA: Diagnosis not present

## 2020-05-20 LAB — BLOOD GAS, ARTERIAL
Acid-base deficit: 1.8 mmol/L (ref 0.0–2.0)
Bicarbonate: 23 mmol/L (ref 20.0–28.0)
Expiratory PAP: 6
FIO2: 0.24
Inspiratory PAP: 12
O2 Saturation: 98.2 %
Patient temperature: 37
pCO2 arterial: 38 mmHg (ref 32.0–48.0)
pH, Arterial: 7.39 (ref 7.350–7.450)
pO2, Arterial: 110 mmHg — ABNORMAL HIGH (ref 83.0–108.0)

## 2020-05-20 LAB — GLUCOSE, CAPILLARY
Glucose-Capillary: 44 mg/dL — CL (ref 70–99)
Glucose-Capillary: 114 mg/dL — ABNORMAL HIGH (ref 70–99)
Glucose-Capillary: 66 mg/dL — ABNORMAL LOW (ref 70–99)
Glucose-Capillary: 73 mg/dL (ref 70–99)
Glucose-Capillary: 119 mg/dL — ABNORMAL HIGH (ref 70–99)
Glucose-Capillary: 68 mg/dL — ABNORMAL LOW (ref 70–99)
Glucose-Capillary: 89 mg/dL (ref 70–99)
Glucose-Capillary: 137 mg/dL — ABNORMAL HIGH (ref 70–99)
Glucose-Capillary: 164 mg/dL — ABNORMAL HIGH (ref 70–99)
Glucose-Capillary: 142 mg/dL — ABNORMAL HIGH (ref 70–99)

## 2020-05-20 LAB — PREPARE RBC (CROSSMATCH)

## 2020-05-20 LAB — TROPONIN I (HIGH SENSITIVITY)
Troponin I (High Sensitivity): 70 ng/L — ABNORMAL HIGH (ref <18)
Troponin I (High Sensitivity): 72 ng/L — ABNORMAL HIGH (ref <18)

## 2020-05-20 SURGERY — ARTERIOVENOUS (AV) FISTULA CREATION
Anesthesia: General | Laterality: Left

## 2020-05-20 MED ORDER — POLYVINYL ALCOHOL 1.4 % OP SOLN
1.0000 [drp] | Freq: Four times a day (QID) | OPHTHALMIC | Status: DC | PRN
  Filled 2020-05-20: qty 15

## 2020-05-20 MED ORDER — NITROGLYCERIN 0.4 MG SL SUBL: 0.4000 mg | SUBLINGUAL_TABLET | SUBLINGUAL | Status: DC | PRN

## 2020-05-20 MED ORDER — PROPOFOL 500 MG/50ML IV EMUL
INTRAVENOUS | Status: AC
  Filled 2020-05-20: qty 50

## 2020-05-20 MED ORDER — CHLORHEXIDINE GLUCONATE 0.12 % MT SOLN: 15.0000 mL | Freq: Once | OROMUCOSAL | Status: AC

## 2020-05-20 MED ORDER — FUROSEMIDE 10 MG/ML IJ SOLN
INTRAMUSCULAR | Status: AC
  Administered 2020-05-20: 40 mg via INTRAVENOUS
  Filled 2020-05-20: qty 4

## 2020-05-20 MED ORDER — ROPIVACAINE HCL 5 MG/ML IJ SOLN
INTRAMUSCULAR | Status: AC
  Filled 2020-05-20: qty 30

## 2020-05-20 MED ORDER — DEXTROSE-NACL 5-0.9 % IV SOLN: INTRAVENOUS | Status: DC

## 2020-05-20 MED ORDER — ROSUVASTATIN CALCIUM 10 MG PO TABS
10.0000 mg | ORAL_TABLET | Freq: Every day | ORAL | Status: DC
  Administered 2020-05-20 – 2020-05-26 (×7): 10 mg via ORAL
  Filled 2020-05-20 (×8): qty 1

## 2020-05-20 MED ORDER — FENTANYL CITRATE (PF) 100 MCG/2ML IJ SOLN
INTRAMUSCULAR | Status: AC
  Administered 2020-05-20: 50 ug via INTRAVENOUS
  Filled 2020-05-20: qty 2

## 2020-05-20 MED ORDER — ALBUTEROL SULFATE HFA 108 (90 BASE) MCG/ACT IN AERS
2.0000 | INHALATION_SPRAY | Freq: Four times a day (QID) | RESPIRATORY_TRACT | Status: DC | PRN
  Filled 2020-05-20: qty 6.7

## 2020-05-20 MED ORDER — MIDAZOLAM HCL 2 MG/2ML IJ SOLN: 1.0000 mg | INTRAMUSCULAR | Status: DC | PRN

## 2020-05-20 MED ORDER — FENTANYL CITRATE (PF) 100 MCG/2ML IJ SOLN
INTRAMUSCULAR | Status: AC
  Filled 2020-05-20: qty 2

## 2020-05-20 MED ORDER — SODIUM CHLORIDE 0.9% FLUSH
3.0000 mL | Freq: Two times a day (BID) | INTRAVENOUS | Status: DC
  Administered 2020-05-20 – 2020-05-26 (×11): 3 mL via INTRAVENOUS

## 2020-05-20 MED ORDER — FENTANYL CITRATE (PF) 100 MCG/2ML IJ SOLN: 50.0000 ug | INTRAMUSCULAR | Status: DC | PRN

## 2020-05-20 MED ORDER — FINASTERIDE 5 MG PO TABS
5.0000 mg | ORAL_TABLET | Freq: Every day | ORAL | Status: DC
  Administered 2020-05-20 – 2020-05-26 (×7): 5 mg via ORAL
  Filled 2020-05-20 (×9): qty 1

## 2020-05-20 MED ORDER — SODIUM CHLORIDE 0.9% IV SOLUTION: Freq: Once | INTRAVENOUS | Status: AC

## 2020-05-20 MED ORDER — DEXTROSE 50 % IV SOLN
INTRAVENOUS | Status: AC
  Administered 2020-05-20: 25 mL via INTRAVENOUS
  Filled 2020-05-20: qty 50

## 2020-05-20 MED ORDER — HEPARIN SODIUM (PORCINE) 5000 UNIT/ML IJ SOLN
INTRAMUSCULAR | Status: AC
  Filled 2020-05-20: qty 1

## 2020-05-20 MED ORDER — MIDAZOLAM HCL 2 MG/2ML IJ SOLN
INTRAMUSCULAR | Status: AC
  Administered 2020-05-20: 1 mg via INTRAVENOUS
  Filled 2020-05-20: qty 2

## 2020-05-20 MED ORDER — BUPIVACAINE LIPOSOME 1.3 % IJ SUSP
INTRAMUSCULAR | Status: AC
  Filled 2020-05-20: qty 20

## 2020-05-20 MED ORDER — SODIUM CHLORIDE 0.9 % IV SOLN: INTRAVENOUS | Status: DC

## 2020-05-20 MED ORDER — DEXTRAN 70-HYPROMELLOSE 0.1-0.3 % OP SOLN: 1.0000 [drp] | Freq: Four times a day (QID) | OPHTHALMIC | Status: DC | PRN

## 2020-05-20 MED ORDER — FUROSEMIDE 10 MG/ML IJ SOLN
80.0000 mg | Freq: Two times a day (BID) | INTRAMUSCULAR | Status: DC
  Administered 2020-05-20: 80 mg via INTRAVENOUS
  Filled 2020-05-20 (×2): qty 8

## 2020-05-20 MED ORDER — ISOSORBIDE MONONITRATE ER 30 MG PO TB24
30.0000 mg | ORAL_TABLET | Freq: Every day | ORAL | Status: DC
  Administered 2020-05-20 – 2020-05-26 (×7): 30 mg via ORAL
  Filled 2020-05-20 (×8): qty 1

## 2020-05-20 MED ORDER — CARVEDILOL 3.125 MG PO TABS
3.1250 mg | ORAL_TABLET | Freq: Two times a day (BID) | ORAL | Status: DC
  Administered 2020-05-20 – 2020-05-26 (×11): 3.125 mg via ORAL
  Filled 2020-05-20 (×12): qty 1

## 2020-05-20 MED ORDER — CLOPIDOGREL BISULFATE 75 MG PO TABS
75.0000 mg | ORAL_TABLET | Freq: Every day | ORAL | Status: DC
  Administered 2020-05-20 – 2020-05-26 (×7): 75 mg via ORAL
  Filled 2020-05-20 (×8): qty 1

## 2020-05-20 MED ORDER — PAPAVERINE HCL 30 MG/ML IJ SOLN
INTRAMUSCULAR | Status: AC
  Filled 2020-05-20: qty 2

## 2020-05-20 MED ORDER — CHLORHEXIDINE GLUCONATE CLOTH 2 % EX PADS
6.0000 | MEDICATED_PAD | Freq: Once | CUTANEOUS | Status: AC
  Administered 2020-05-20: 6 via TOPICAL

## 2020-05-20 MED ORDER — BUPIVACAINE HCL (PF) 0.5 % IJ SOLN
INTRAMUSCULAR | Status: AC
  Filled 2020-05-20: qty 30

## 2020-05-20 MED ORDER — TAMSULOSIN HCL 0.4 MG PO CAPS
0.4000 mg | ORAL_CAPSULE | Freq: Every day | ORAL | Status: DC
  Administered 2020-05-20 – 2020-05-26 (×7): 0.4 mg via ORAL
  Filled 2020-05-20 (×8): qty 1

## 2020-05-20 MED ORDER — FAMOTIDINE 20 MG PO TABS
ORAL_TABLET | ORAL | Status: AC
  Administered 2020-05-20: 20 mg via ORAL
  Filled 2020-05-20: qty 1

## 2020-05-20 MED ORDER — FUROSEMIDE 10 MG/ML IJ SOLN: 40.0000 mg | Freq: Two times a day (BID) | INTRAMUSCULAR | Status: DC

## 2020-05-20 MED ORDER — CLINDAMYCIN PHOSPHATE 300 MG/50ML IV SOLN
INTRAVENOUS | Status: AC
  Filled 2020-05-20: qty 50

## 2020-05-20 MED ORDER — ORAL CARE MOUTH RINSE: 15.0000 mL | Freq: Once | OROMUCOSAL | Status: AC

## 2020-05-20 MED ORDER — DEXTROSE 50 % IV SOLN
INTRAVENOUS | Status: AC
  Filled 2020-05-20: qty 50

## 2020-05-20 MED ORDER — SODIUM CHLORIDE 0.9% FLUSH: 3.0000 mL | INTRAVENOUS | Status: DC | PRN

## 2020-05-20 MED ORDER — CHLORHEXIDINE GLUCONATE 0.12 % MT SOLN
OROMUCOSAL | Status: AC
  Administered 2020-05-20: 15 mL via OROMUCOSAL
  Filled 2020-05-20: qty 15

## 2020-05-20 MED ORDER — DEXTROSE 50 % IV SOLN
25.0000 mL | Freq: Once | INTRAVENOUS | Status: AC
  Administered 2020-05-20: 25 mL via INTRAVENOUS

## 2020-05-20 MED ORDER — LIDOCAINE HCL (PF) 1 % IJ SOLN
INTRAMUSCULAR | Status: AC
  Filled 2020-05-20: qty 5

## 2020-05-20 MED ORDER — ACETAMINOPHEN 325 MG PO TABS
650.0000 mg | ORAL_TABLET | ORAL | Status: DC | PRN
  Administered 2020-05-23 – 2020-05-26 (×6): 650 mg via ORAL
  Filled 2020-05-20 (×6): qty 2

## 2020-05-20 MED ORDER — DEXTROSE 50 % IV SOLN: 25.0000 mL | Freq: Once | INTRAVENOUS | Status: AC

## 2020-05-20 MED ORDER — SODIUM CHLORIDE 0.9 % IV SOLN: 250.0000 mL | INTRAVENOUS | Status: DC | PRN

## 2020-05-20 MED ORDER — CYCLOBENZAPRINE HCL 10 MG PO TABS
10.0000 mg | ORAL_TABLET | Freq: Three times a day (TID) | ORAL | Status: DC | PRN
  Administered 2020-05-20 – 2020-05-24 (×4): 10 mg via ORAL
  Filled 2020-05-20 (×4): qty 1

## 2020-05-20 MED ORDER — CALCITRIOL 0.25 MCG PO CAPS
0.2500 ug | ORAL_CAPSULE | ORAL | Status: DC
  Administered 2020-05-23 – 2020-05-25 (×2): 0.25 ug via ORAL
  Filled 2020-05-20 (×3): qty 1

## 2020-05-20 MED ORDER — MIDAZOLAM HCL 2 MG/2ML IJ SOLN
INTRAMUSCULAR | Status: AC
  Filled 2020-05-20: qty 2

## 2020-05-20 MED ORDER — ONDANSETRON HCL 4 MG/2ML IJ SOLN: 4.0000 mg | Freq: Four times a day (QID) | INTRAMUSCULAR | Status: DC | PRN

## 2020-05-20 MED ORDER — BUSPIRONE HCL 10 MG PO TABS
20.0000 mg | ORAL_TABLET | Freq: Two times a day (BID) | ORAL | Status: DC
  Administered 2020-05-20 – 2020-05-26 (×13): 20 mg via ORAL
  Filled 2020-05-20 (×15): qty 2

## 2020-05-20 MED ORDER — CLINDAMYCIN PHOSPHATE 300 MG/50ML IV SOLN: 300.0000 mg | INTRAVENOUS | Status: DC

## 2020-05-20 MED ORDER — SERTRALINE HCL 50 MG PO TABS
100.0000 mg | ORAL_TABLET | Freq: Every day | ORAL | Status: DC
  Administered 2020-05-20 – 2020-05-26 (×7): 100 mg via ORAL
  Filled 2020-05-20 (×4): qty 2
  Filled 2020-05-20: qty 1
  Filled 2020-05-20 (×3): qty 2

## 2020-05-20 MED ORDER — FAMOTIDINE 20 MG PO TABS: 20.0000 mg | ORAL_TABLET | Freq: Once | ORAL | Status: AC

## 2020-05-20 SURGICAL SUPPLY — 54 items
APPLIER CLIP 11 MED OPEN (CLIP)
APPLIER CLIP 9.375 SM OPEN (CLIP)
BAG DECANTER FOR FLEXI CONT (MISCELLANEOUS) ×3 IMPLANT
BLADE SURG SZ11 CARB STEEL (BLADE) ×3 IMPLANT
BOOT SUTURE AID YELLOW STND (SUTURE) ×3 IMPLANT
BRUSH SCRUB EZ  4% CHG (MISCELLANEOUS) ×2
BRUSH SCRUB EZ 4% CHG (MISCELLANEOUS) ×1 IMPLANT
CANISTER SUCT 1200ML W/VALVE (MISCELLANEOUS) ×3 IMPLANT
CHLORAPREP W/TINT 26 (MISCELLANEOUS) ×3 IMPLANT
CLIP APPLIE 11 MED OPEN (CLIP) IMPLANT
CLIP APPLIE 9.375 SM OPEN (CLIP) IMPLANT
COVER WAND RF STERILE (DRAPES) ×3 IMPLANT
DERMABOND ADVANCED (GAUZE/BANDAGES/DRESSINGS) ×2
DERMABOND ADVANCED .7 DNX12 (GAUZE/BANDAGES/DRESSINGS) ×1 IMPLANT
DRESSING SURGICEL FIBRLLR 1X2 (HEMOSTASIS) ×1 IMPLANT
DRSG SURGICEL FIBRILLAR 1X2 (HEMOSTASIS) ×3
ELECT CAUTERY BLADE 6.4 (BLADE) ×3 IMPLANT
ELECT REM PT RETURN 9FT ADLT (ELECTROSURGICAL) ×3
ELECTRODE REM PT RTRN 9FT ADLT (ELECTROSURGICAL) ×1 IMPLANT
GEL ULTRASOUND 20GR AQUASONIC (MISCELLANEOUS) IMPLANT
GLOVE BIO SURGEON STRL SZ7 (GLOVE) ×3 IMPLANT
GLOVE INDICATOR 7.5 STRL GRN (GLOVE) ×3 IMPLANT
GLOVE SURG SYN 8.0 (GLOVE) ×3 IMPLANT
GOWN STRL REUS W/ TWL LRG LVL3 (GOWN DISPOSABLE) ×2 IMPLANT
GOWN STRL REUS W/ TWL XL LVL3 (GOWN DISPOSABLE) ×1 IMPLANT
GOWN STRL REUS W/TWL LRG LVL3 (GOWN DISPOSABLE) ×4
GOWN STRL REUS W/TWL XL LVL3 (GOWN DISPOSABLE) ×2
IV NS 500ML (IV SOLUTION) ×2
IV NS 500ML BAXH (IV SOLUTION) ×1 IMPLANT
KIT TURNOVER KIT A (KITS) ×3 IMPLANT
LABEL OR SOLS (LABEL) ×3 IMPLANT
LOOP RED MAXI  1X406MM (MISCELLANEOUS) ×2
LOOP VESSEL MAXI 1X406 RED (MISCELLANEOUS) ×1 IMPLANT
LOOP VESSEL MINI 0.8X406 BLUE (MISCELLANEOUS) ×2 IMPLANT
LOOPS BLUE MINI 0.8X406MM (MISCELLANEOUS) ×4
NEEDLE FILTER BLUNT 18X 1/2SAF (NEEDLE) ×2
NEEDLE FILTER BLUNT 18X1 1/2 (NEEDLE) ×1 IMPLANT
NEEDLE HYPO 30X.5 LL (NEEDLE) IMPLANT
PACK EXTREMITY (MISCELLANEOUS) ×3 IMPLANT
PAD PREP 24X41 OB/GYN DISP (PERSONAL CARE ITEMS) ×3 IMPLANT
STOCKINETTE STRL 4IN 9604848 (GAUZE/BANDAGES/DRESSINGS) ×3 IMPLANT
SUT MNCRL+ 5-0 UNDYED PC-3 (SUTURE) ×1 IMPLANT
SUT MONOCRYL 5-0 (SUTURE) ×2
SUT PROLENE 6 0 BV (SUTURE) ×12 IMPLANT
SUT SILK 2 0 (SUTURE) ×2
SUT SILK 2-0 18XBRD TIE 12 (SUTURE) ×1 IMPLANT
SUT SILK 3 0 (SUTURE) ×2
SUT SILK 3-0 18XBRD TIE 12 (SUTURE) ×1 IMPLANT
SUT SILK 4 0 (SUTURE) ×2
SUT SILK 4-0 18XBRD TIE 12 (SUTURE) ×1 IMPLANT
SUT VIC AB 3-0 SH 27 (SUTURE) ×2
SUT VIC AB 3-0 SH 27X BRD (SUTURE) ×1 IMPLANT
SYR 20ML LL LF (SYRINGE) ×3 IMPLANT
SYR 3ML LL SCALE MARK (SYRINGE) ×3 IMPLANT

## 2020-05-20 NOTE — Progress Notes (Signed)
Per Dr. Delana Meyer NO blood draws from LFT arm where fistula will be placed (pending surgery). Pink restriction sleeve placed on pt and receiving RN made aware

## 2020-05-20 NOTE — Progress Notes (Addendum)
Pt cbg check was 44 upon arrival to pre-op. Pt in NAD, Alert and speaking in full sentences. This RN spoke with Dr. Kayleen Memos and received verbal orders for 25ML of D50. Will recheck cbg 15 post med.

## 2020-05-20 NOTE — Interval H&P Note (Signed)
History and Physical Interval Note:  05/20/2020 7:28 AM  [name] [name]  has presented today for surgery, with the diagnosis of ESRD.  The various methods of treatment have been discussed with the patient and family. After consideration of risks, benefits and other options for treatment, the patient has consented to  Procedure(s): ARTERIOVENOUS (AV) FISTULA CREATION (BRACHIAL CEPHALIC ) (Left) as a surgical intervention.  The patient's history has been reviewed, patient examined, no change in status, stable for surgery.  I have reviewed the patient's chart and labs.  Questions were answered to the patient's satisfaction.     Hortencia Pilar

## 2020-05-20 NOTE — Progress Notes (Signed)
Lab unable to draw labs due to blood transfusing. MD Agbata made aware. Lab will be called after transfusion complete

## 2020-05-20 NOTE — Progress Notes (Signed)
PT noted to have increased respiratory effort during block. Dr. Lubertha Basque at bedside. This RN at bedside

## 2020-05-20 NOTE — H&P (Signed)
History and Physical    [name] [name]:[id number] DOB: [birth year] DOA: 05/20/2020  PCP: [name], MD   Patient coming from: Home  I have personally briefly reviewed patient's old medical records in [place]  Chief Complaint: Shortness of breath  HPI: [name] [name] is a 64 y.o. male with medical history significant for coronary artery disease status post three-vessel CABG, status post recent PCI with stent angioplasty of the ostial right coronary artery, history of diabetes mellitus with stage IV chronic kidney disease, severe AS and hypertension who presented as an outpatient for creation of an AV fistula (brachiocephalic) in preparation for initiation of renal replacement therapy. Patient received Versed for sedation prior to procedure and developed abdominal breathing requiring noninvasive mechanical ventilation to reduce his work of breathing.  He was also noted to be hypoglycemic with initial blood sugars in the 40s.  He received D50 and was started on dextrose containing IV fluids.  He was also noted to have a hemoglobin of 7.5g/dl and is currently receiving 1 unit of packed RBC.  Per RN his pulse oximetry did not drop below 90% but he had increased work of breathing. Chest x-ray reviewed by me showed findings suggestive of CHF with mild pulmonary edema. Patient's procedure was canceled and will be rescheduled I am unable to do a review of systems on this patient as he is lethargic but arouses easily. Labs show sodium 139, potassium 4.9, chloride 105, bicarb 26, BUN 55, creatinine 3.25, calcium 8.5, white count 8.2, hemoglobin 7.5, hematocrit 24.3, MCV 95.3, RDW 16.6, platelet count 160 His COVID-19 PCR test is negative     ED Course: N/A  Review of Systems: As per HPI otherwise 10 point review of systems negative.    Past Medical History:  Diagnosis Date  . 3-vessel CAD    8/30 LHC with radial graft to RCA occluded at the ostium with critical stenosis of  the ostial native RCA.  . Adenomatous colon polyp   . Allergy   . Anemia   . Anxiety   . Arthritis    RA  . Asthma   . Bell's palsy 2007   neuropathy  . CAD (coronary artery disease)    03/21/2020 LHC with patent LIMA to LAD and SVG to OM 3.  Radial graft to RCA occluded at the ostium with critical stenosis of the ostial and native RCA.  . Carotid artery occlusion    Bilateral carotid stenosis s/p left-sided CEA.  . Cataract    Dr. Dawna Part  . CKD (chronic kidney disease), stage IV ([place])    03/2020 vascular surgery access planned  . Depression   . Diabetes mellitus   . Diverticulosis   . Duodenitis   . DVT (deep venous thrombosis) (HCC)    in leg  . Dyspnea    with edema  . ESRD (end stage renal disease) ([place])   . Gastropathy   . GERD (gastroesophageal reflux disease)   . Heart failure with preserved ejection fraction ([place])    8/30 RHC with moderately to severely elevated filling pressures and pulmonary wedge pressure 31 mmHg, moderate pulmonary hypertension  . Heart murmur   . Helicobacter pylori gastritis   . Hx of CABG    2012  . Hyperlipidemia   . Hypertension   . Mitral regurgitation and aortic stenosis    RHC 8/30 and echo 12/2019.  [name] Myocardial infarction ([place]) 2021  . Neuropathy   . Obesity   . Peripheral vascular disease (  [place])    S/p extensive revascularization by vascular surgery 05/2019  . Post splenectomy syndrome   . Psoriasis   . Severe aortic stenosis    03/21/20 RHC with peak gradient 27.4 mmHg and valve area 0.87.  [name] Sleep apnea    uses cpap  . Stroke ([place])   . Tobacco use disorder    recently quit  . Tubular adenoma 08/2013   Dr. Hilarie Fredrickson  . Weak urinary stream     Past Surgical History:  Procedure Laterality Date  . CARDIAC CATHETERIZATION  08/2010   LAD: 80% ISR, RCA: 80% ostial, OM 80-90%  . CAROTID ENDARTERECTOMY  08/2010   left/ Dr. Kellie Simmering  . CATARACT EXTRACTION W/PHACO Left 07/09/2017   Procedure: CATARACT EXTRACTION PHACO AND  INTRAOCULAR LENS PLACEMENT (IOC);  Surgeon: Birder Robson, MD;  Location: [facility] ORS;  Service: Ophthalmology;  Laterality: Left;  [place] 00:23 AP% 14.2 CDE 3.26 Fluid pack lot # [id number]  . CATARACT EXTRACTION W/PHACO Right 09/10/2017   Procedure: CATARACT EXTRACTION PHACO AND INTRAOCULAR LENS PLACEMENT (IOC);  Surgeon: Birder Robson, MD;  Location: [facility] OR[facility];  Service: Ophthalmology;  Laterality: Right;  [place] 00:26.4 AP% 17.3 CDE 4.59 Fluid Pack Lot # [id number]  . COLONOSCOPY    . CORONARY ANGIOPLASTY     LAD: before CABG  . CORONARY ARTERY BYPASS GRAFT  09/06/2010   At [facility]: LIMA to LAD, left radial to RCA, sequential SVG to OM3 and 4  . CORONARY STENT INTERVENTION N/A 03/23/2020   Procedure: CORONARY STENT INTERVENTION;  Surgeon: Wellington Hampshire, MD;  Location: [facility];  Service: Cardiovascular;  Laterality: N/A;  RCA  . ESOPHAGOGASTRODUODENOSCOPY (EGD) WITH PROPOFOL N/A 04/11/2020   Procedure: ESOPHAGOGASTRODUODENOSCOPY (EGD) WITH PROPOFOL;  Surgeon: Lucilla Lame, MD;  Location: [facility] ENDOSCOPY;  Service: Endoscopy;  Laterality: N/A;  . heart stents  Jan 2011   leg stents 06/2009 and 03/2010  . LOWER EXTREMITY ANGIOGRAPHY Left 06/09/2019   Procedure: LOWER EXTREMITY ANGIOGRAPHY;  Surgeon: Katha Cabal, MD;  Location: [facility];  Service: Cardiovascular;  Laterality: Left;  . PTA of illiac and SFA  multiple   Dr. Ronalee Belts, s/p revision 11/2012  . RIGHT AND LEFT HEART CATH N/A 03/21/2020   Procedure: RIGHT AND LEFT HEART CATH possible percutaneous intervention;  Surgeon: Wellington Hampshire, MD;  Location: [facility];  Service: Cardiovascular;  Laterality: N/A;  . RIGHT/LEFT HEART CATH AND CORONARY/GRAFT ANGIOGRAPHY N/A 02/15/2020   Procedure: RIGHT/LEFT HEART CATH AND CORONARY/GRAFT ANGIOGRAPHY;  Surgeon: Wellington Hampshire, MD;  Location: [facility];  Service: Cardiovascular;  Laterality: N/A;  . SPLENECTOMY    . VASCULAR SURGERY     LEG STENTS      reports that he has quit smoking. His smoking use included cigarettes. He has a 10.00 pack-year smoking history. He has never used smokeless tobacco. He reports that he does not drink alcohol and does not use drugs.  Allergies  Allergen Reactions  . Contrast Media [Iodinated Diagnostic Agents] Rash and Other (See Comments)    Got very hot and red   . Glipizide Other (See Comments)    ANTIDIABETICS. Burning  . Hydroxychloroquine Other (See Comments)    Stomach upset  . Metrizamide Other (See Comments)    Got very hot and red  . Penicillins Hives and Swelling    Has patient had a PCN reaction causing immediate rash, facial/tongue/throat swelling, SOB or lightheadedness with hypotension: yes Has patient had a PCN reaction causing severe  rash involving mucus membranes or skin necrosis: no  Has patient had a PCN reaction that required hospitalization: yes Has patient had a PCN reaction occurring within the last 10 years: no If all of the above answers are "NO", then may proceed with Cephalosporin use.     Family History  Problem Relation Age of Onset  . Lung cancer Father 67  . Arthritis Father   . Breast cancer Sister 43  . Alcoholism Sister   . Dementia Mother   . Alzheimer's disease Mother   . Arthritis Mother   . Alcoholism Brother   . Epilepsy Sister   . Diabetes Paternal Grandfather   . Colon polyps Paternal Grandfather 63  . Alcoholism Sister   . Alcoholism Sister   . Alcoholism Brother      Prior to Admission medications   Medication Sig Start Date End Date Taking? Authorizing Provider  acetaminophen (TYLENOL) 500 MG tablet Take 500 mg by mouth every 6 (six) hours as needed for moderate pain or headache.   Yes [provider]  albuterol (PROAIR HFA) 108 (90 Base) MCG/ACT inhaler Inhale 2 puffs into the lungs every 6 (six) hours as needed for shortness of breath. 09/14/19  Yes [name], MD  alum & mag hydroxide-simeth (MAALOX/MYLANTA) 200-200-20  MG/5ML suspension Take 30 mLs by mouth every 6 (six) hours as needed for indigestion or heartburn.   Yes [provider]  busPIRone (BUSPAR) 10 MG tablet TAKE 2 TABLETS(20 MG) BY MOUTH TWICE DAILY Patient taking differently: Take 20 mg by mouth 2 (two) times daily.  02/19/20  Yes [name], MD  calcitRIOL (ROCALTROL) 0.25 MCG capsule Take 0.25 mcg by mouth every Monday, Wednesday, and Friday.    Yes [provider]  carvedilol (COREG) 12.5 MG tablet Take 1 tablet by mouth at breakfast and 1 tablet at bedtime. 05/19/20  Yes Wellington Hampshire, MD  clopidogrel (PLAVIX) 75 MG tablet Take 1 tablet (75 mg total) by mouth daily. 03/25/20  Yes Sande Rives E, PA-C  cyclobenzaprine (FLEXERIL) 10 MG tablet Take 1 tablet (10 mg total) by mouth 3 (three) times daily as needed for muscle spasms. 05/19/20  Yes [name], MD  Dextran 70-Hypromellose (ARTIFICIAL TEARS) 0.1-0.3 % SOLN Place 1 drop into both eyes 4 (four) times daily as needed (dry eyes).   Yes [provider]  diphenhydrAMINE (BENADRYL) 25 MG tablet Take 50 mg by mouth daily as needed for allergies.   Yes [provider]  finasteride (PROSCAR) 5 MG tablet Take 5 mg by mouth daily.   Yes [provider]  hydrALAZINE (APRESOLINE) 25 MG tablet Take 1 tablet (25 mg total) by mouth every 8 (eight) hours. 05/18/20  Yes Wellington Hampshire, MD  insulin NPH Human (HUMULIN N) 100 UNIT/ML injection Inject 0.1-0.25 mLs (10-25 Units total) into the skin daily as needed (high blood sugar over 235). 05/19/20  Yes [name], MD  insulin regular (NOVOLIN R) 100 units/mL injection Inject 0.1-0.25 mLs (10-25 Units total) into the skin daily as needed (high blood sugar over 235). 05/19/20  Yes [name], MD  isosorbide mononitrate (IMDUR) 30 MG 24 hr tablet Take 1 tablet (30 mg total) by mouth daily. 05/19/20  Yes Wellington Hampshire, MD  nitroGLYCERIN (NITROSTAT) 0.4 MG SL tablet DISSOLVE 1 TABLET  UNDER THE TONGUE EVERY 5 MINUTES AS NEEDED FOR CHEST PAIN. DO NOT EXCEED A TOTAL OF 3 DOSES IN 15 MINUTES. Patient taking differently: Place 0.4 mg  under the tongue every 5 (five) minutes as needed for chest pain.  04/18/20  Yes Dunn, Areta Haber, PA-C  rosuvastatin (CRESTOR) 10 MG tablet Take 1 tablet (10 mg total) by mouth daily. 04/25/20  Yes Wellington Hampshire, MD  sertraline (ZOLOFT) 100 MG tablet Take 1 tablet (100 mg total) by mouth daily. 02/23/20  Yes [name], MD  tamsulosin (FLOMAX) 0.4 MG CAPS capsule Take 1 capsule by mouth daily Patient taking differently: Take 0.4 mg by mouth daily.  01/20/20  Yes [name], MD  torsemide (DEMADEX) 20 MG tablet Take 2 tablets (40 mg total) by mouth 2 (two) times daily. 04/07/20  Yes Loel Dubonnet, NP  CONTOUR TEST test strip USE TO check blood sugar ONCE DAILY AS DIRECTED 05/10/20   Philemon Kingdom, MD  insulin aspart (NOVOLOG) 100 UNIT/ML injection Inject 0-9 Units into the skin 3 (three) times daily with meals. 03/21/20   Ezekiel Slocumb, DO  Insulin Syringe-Needle U-100 30G X 15/64" 1 ML MISC Use to inject insulin as directed 05/19/20   [name], MD  predniSONE (DELTASONE) 50 MG tablet Take 13 hours, seven hours, and 1 hour prior to your CT. 04/25/20   Eileen Stanford, PA-C    Physical Exam: Vitals:   05/20/20 0826 05/20/20 0906 05/20/20 0913 05/20/20 0929  BP: 114/79 (!) 109/57 105/64 108/62  Pulse: 84 81 79 80  Resp: (!) 24 (!) 24 (!) 24 (!) 24  Temp:   (!) 97 F (36.1 C) (!) 97 F (36.1 C)  TempSrc:   Temporal Temporal  SpO2: 100% 97% 100% 100%     Vitals:   05/20/20 0826 05/20/20 0906 05/20/20 0913 05/20/20 0929  BP: 114/79 (!) 109/57 105/64 108/62  Pulse: 84 81 79 80  Resp: (!) 24 (!) 24 (!) 24 (!) 24  Temp:   (!) 97 F (36.1 C) (!) 97 F (36.1 C)  TempSrc:   Temporal Temporal  SpO2: 100% 97% 100% 100%    Constitutional: NAD, lethargic but arouses easily Eyes: PERRL, lids and conjunctivae  pallor ENMT: Mucous membranes are moist.  Neck: normal, supple, no masses, no thyromegaly Respiratory: Crackles at the bases, no wheezing. Normal respiratory effort. No accessory muscle use.  Cardiovascular: Regular rate and rhythm, no murmurs / rubs / gallops. No extremity edema. 2+ pedal pulses. No carotid bruits.  Abdomen: no tenderness, no masses palpated. No hepatosplenomegaly. Bowel sounds positive.  Musculoskeletal: no clubbing / cyanosis. No joint deformity upper and lower extremities.  Skin: no rashes, lesions, ulcers.  Neurologic: Unable to assess Psychiatric: Unable to assess   Labs on Admission: I have personally reviewed following labs and imaging studies  CBC: Recent Labs  Lab 05/18/20 1057  WBC 8.2  NEUTROABS 5.7  HGB 7.5*  HCT 24.3*  MCV 95.3  PLT 092   Basic Metabolic Panel: Recent Labs  Lab 05/18/20 1057  NA 139  K 4.9  CL 105  CO2 26  GLUCOSE 185*  BUN 55*  CREATININE 3.25*  CALCIUM 8.5*   GFR: Estimated Creatinine Clearance: 25 mL/min (A) (by C-G formula based on SCr of 3.25 mg/dL (H)). Liver Function Tests: No results for input(s): AST, ALT, ALKPHOS, BILITOT, PROT, ALBUMIN in the last 168 hours. No results for input(s): LIPASE, AMYLASE in the last 168 hours. No results for input(s): AMMONIA in the last 168 hours. Coagulation Profile: Recent Labs  Lab 05/18/20 1057  INR 1.0   Cardiac Enzymes: No results for input(s): CKTOTAL, CKMB,  CKMBINDEX, TROPONINI in the last 168 hours. BNP (last 3 results) No results for input(s): PROBNP in the last 8760 hours. HbA1C: No results for input(s): HGBA1C in the last 72 hours. CBG: Recent Labs  Lab 05/20/20 0628 05/20/20 0649 05/20/20 0712 05/20/20 0829 05/20/20 0855  GLUCAP 44* 66* 114* 73 119*   Lipid Profile: No results for input(s): CHOL, HDL, LDLCALC, TRIG, CHOLHDL, LDLDIRECT in the last 72 hours. Thyroid Function Tests: No results for input(s): TSH, T4TOTAL, FREET4, T3FREE, THYROIDAB in  the last 72 hours. Anemia Panel: No results for input(s): VITAMINB12, FOLATE, FERRITIN, TIBC, IRON, RETICCTPCT in the last 72 hours. Urine analysis:    Component Value Date/Time   COLORURINE YELLOW (A) 02/08/2020 1106   APPEARANCEUR CLEAR (A) 02/08/2020 1106   APPEARANCEUR Clear 06/13/2015 1012   LABSPEC 1.011 02/08/2020 1106   PHURINE 7.0 02/08/2020 1106   GLUCOSEU NEGATIVE 02/08/2020 1106   HGBUR NEGATIVE 02/08/2020 1106   BILIRUBINUR NEGATIVE 02/08/2020 1106   BILIRUBINUR Negative 06/13/2015 1012   KETONESUR NEGATIVE 02/08/2020 1106   PROTEINUR 100 (A) 02/08/2020 1106   UROBILINOGEN 0.2 12/21/2014 1336   NITRITE NEGATIVE 02/08/2020 1106   LEUKOCYTESUR NEGATIVE 02/08/2020 1106    Radiological Exams on Admission: DG Chest Portable 1 View  Result Date: 05/20/2020 CLINICAL DATA:  Shortness of breath EXAM: PORTABLE CHEST 1 VIEW COMPARISON:  04/09/2020 FINDINGS: Post CABG changes. Stable cardiomediastinal contours. Pulmonary vascular congestion mild diffuse interstitial opacities throughout both lungs. Suspect trace bilateral pleural effusions. No pneumothorax. IMPRESSION: Findings suggestive of CHF with mild pulmonary edema. These results will be called to the ordering clinician or representative by the Radiologist Assistant, and communication documented in the PACS or [employer]. Electronically Signed   By: Davina Poke D.O.   On: 05/20/2020 08:19   [place] OR NERVE BLOCK-IMAGE ONLY [facility])  Result Date: 05/20/2020 There is no interpretation for this exam.  This order is for images obtained during a surgical procedure.  Please See "Surgeries" Tab for more information regarding the procedure.    EKG: Independently reviewed.  Sinus rhythm with non specific T wave abnormality  Assessment/Plan Principal Problem:   Acute on chronic combined systolic and diastolic CHF (congestive heart failure) (HCC) Active Problems:   DM (diabetes mellitus), type 2 with complications (HCC)    CAD (coronary artery disease)   CKD (chronic kidney disease), stage IV (HCC)   Aortic stenosis   Anemia in chronic kidney disease     Acute on chronic combined systolic and diastolic dysfunction CHF Patient's last known LVEF is 30 to 35% with moderate aortic stenosis Patient developed respiratory distress requiring noninvasive mechanical ventilation and chest x-ray shows findings suggestive of CHF with mild pulmonary edema We will place patient on Lasix 40 mg IV every 12 Patient is normotensive, will decrease dose of carvedilol to 3.125 mg twice daily Continue nitrates    Diabetes mellitus with complications of stage IV chronic kidney disease and hypoglycemia Patient noted to be hypoglycemic upon arrival for his procedure with blood sugar in the 40s Remains on dextrose containing infusion Check blood sugars every 2 hours and if stable will change to every 4 Patient has stage IV chronic kidney disease and was scheduled for creation of an AV fistula in anticipation of renal replacement therapy Follow-up with vascular surgery as an outpatient We will request nephrology consult    Anemia in chronic kidney disease Patient noted to have a hemoglobin of 7.5g/dl Received 1 unit of packed RBC     Coronary artery  disease Status post CABG Status post recent stent angioplasty of the  RCA Continue Plavix, Coreg, statins and nitrates    DVT prophylaxis: SCD Code Status: Full code Family Communication:  Disposition Plan: Greater than 50% of time was spent discussing plan of care with patient at the bedside. All questions and concerns have been addressed Consults called: Cardiology/nephrology    Jonpaul Lumm MD [facility]     05/20/2020, 9:38 AM

## 2020-05-20 NOTE — Anesthesia Procedure Notes (Signed)
Anesthesia Regional Block: Supraclavicular block   Pre-Anesthetic Checklist: ,, timeout performed, Correct Patient, Correct Site, Correct Laterality, Correct Procedure, Correct Position, site marked, Risks and benefits discussed,  Surgical consent,  Pre-op evaluation,  At surgeon's request and post-op pain management  Laterality: Left  Prep: chloraprep       Needles:  Injection technique: Single-shot     Needle Length: 9cm  Needle Gauge: 21     Additional Needles:   Procedures:,,,, ultrasound used (permanent image in chart),,,,  Narrative:  Start time: 05/20/2020 7:41 AM End time: 05/20/2020 7:45 AM  Performed by: Personally  Anesthesiologist: Tera Mater, MD  Additional Notes: Risks and benefits of nerve block discussed with patient, including but not limited to risk of nerve injury, bleeding, infection, and failed block.  Patient expressed understanding and consented to block placement.   Functioning IV was confirmed and monitors were applied.  Sterile prep,hand hygiene and sterile gloves were used.  Minimal sedation used for procedure.  During the procedure, there was negative aspiration, negative paresthesia on injection, and dose was given in divided aliquots under ultrasound guidance.  Patient tolerated the procedure well with no immediate complications.

## 2020-05-20 NOTE — Progress Notes (Addendum)
PT sitting up in recliner, pt respiratory effort has regulated and pt is speaking in full sentences. Pt tolerating RA at this time. Will continue to monitor. MD aware

## 2020-05-20 NOTE — Progress Notes (Signed)
Dr. Lubertha Basque called to bedside, pt states " hard to take deep breath". PT has noted labored breathing. PT 99% on 5L. Verbal orders given for STAT chest xray

## 2020-05-20 NOTE — Progress Notes (Signed)
Pt unable to tolerate being off bi-pap. O2 sats continue to stay 98-99% but pt states he is uncomfortable. RT called back to bedside. MD Agbata made aware

## 2020-05-20 NOTE — Progress Notes (Addendum)
Admitting Dr. Francine Graven at bedside, verbal orders given to slow down IVF and blood rate due to pulmonary edema.   EKG completed at this time per MD . Blood slowed to 64ml/hr and D5NS to 44mL/hr

## 2020-05-20 NOTE — Anesthesia Preprocedure Evaluation (Signed)
Anesthesia Evaluation  Patient identified by MRN, date of birth, ID band Patient awake    Reviewed: Allergy & Precautions, H&P , NPO status , Patient's Chart, lab work & pertinent test results, reviewed documented beta blocker date and time   History of Anesthesia Complications Negative for: history of anesthetic complications  Airway Mallampati: III  TM Distance: >3 FB Neck ROM: Full    Dental  (+) Edentulous Upper, Edentulous Lower   Pulmonary shortness of breath, at rest and lying, asthma , sleep apnea and Continuous Positive Airway Pressure Ventilation , COPD, Not current smoker, former smoker,    Pulmonary exam normal breath sounds clear to auscultation       Cardiovascular Exercise Tolerance: Poor METS: < 3 Mets hypertension, + angina + CAD, + Past MI, + Cardiac Stents, + CABG, + Peripheral Vascular Disease and +CHF  Normal cardiovascular examIII+ Valvular Problems/Murmurs AS and MR  Rhythm:Regular Rate:Normal + Systolic murmurs Recent MI/Stent 03/23/2020. TTE: 1. Left ventricular ejection fraction, by estimation, is 30-35%. The left  ventricle has moderately decreased function. The left ventricle  demonstrates regional wall motion abnormalities, basal to mid inferior and  inferoseptal akinesis, basal to mid  inferolateral severe hypokinesis. The left ventricular internal cavity  size was mildly dilated. There is mild left ventricular hypertrophy. Left  ventricular diastolic parameters are consistent with Grade II diastolic  dysfunction (pseudonormalization).  2. Right ventricular systolic function is moderately reduced. The right  ventricular size is normal. There is mildly elevated pulmonary artery  systolic pressure. The estimated right ventricular systolic pressure is  66.0 mmHg.  3. Left atrial size was moderately dilated.  4. The mitral valve is degenerative. Moderate to severe mitral valve  regurgitation. No  evidence of mitral stenosis.  5. The aortic valve is tricuspid. Aortic valve regurgitation is mild.  Moderate aortic valve stenosis. Aortic valve area, by VTI measures 1.08  cm. Aortic valve mean gradient measures 22.0 mmHg.  6. Aortic dilatation noted. There is mild dilatation of the ascending  aorta measuring 42 mm.  7. The inferior vena cava is dilated in size with >50% respiratory  variability, suggesting right atrial pressure of 8 mmHg.   LHC:  Ost RCA to Prox RCA lesion is 99% stenosed.  Post intervention, there is a 0% residual stenosis.  A drug-eluting stent was successfully placed using a STENT RESOLUTE ONYX 4.0X18.   Successful angioplasty and drug-eluting stent placement to the ostial right coronary artery.  60 mL of contrast was used.  Recommendations: Continue dual antiplatelet therapy for at least 6 months. The patient can undergo endoscopic GI procedures from a cardiac standpoint at an overall moderate risk.  Although his aortic stenosis is severe, it does not seem to be critical.  Maintain a hemoglobin above 8. The patient will likely require starting dialysis very soon in order to facilitate continued evaluation for TAVR.     Neuro/Psych  Headaches, PSYCHIATRIC DISORDERS Anxiety Depression  Neuromuscular disease CVA    GI/Hepatic GERD  Controlled,  Endo/Other  diabetes  Renal/GU ESRFRenal disease     Musculoskeletal   Abdominal   Peds  Hematology   Anesthesia Other Findings Past Medical History: No date: 3-vessel CAD     Comment:  8/30 LHC with radial graft to RCA occluded at the ostium              with critical stenosis of the ostial native RCA. No date: Adenomatous colon polyp No date: Allergy No date: Anxiety No date: Arthritis  Comment:  RA 2007: Bell's palsy No date: CAD (coronary artery disease)     Comment:  03/21/2020 LHC with patent LIMA to LAD and SVG to OM 3.                Radial graft to RCA occluded at the ostium with  critical               stenosis of the ostial and native RCA. No date: Carotid artery occlusion     Comment:  Bilateral carotid stenosis s/p left-sided CEA. No date: Cataract     Comment:  Dr. Dawna Part No date: CKD (chronic kidney disease), stage IV ([place])     Comment:  03/2020 vascular surgery access planned No date: Depression No date: Diabetes mellitus No date: Diverticulosis No date: Duodenitis No date: DVT (deep venous thrombosis) ([facility])     Comment:  in leg No date: ESRD (end stage renal disease) ([place]) No date: Gastropathy No date: GERD (gastroesophageal reflux disease) No date: Heart failure with preserved ejection fraction ([place])     Comment:  8/30 RHC with moderately to severely elevated filling               pressures and pulmonary wedge pressure 31 mmHg, moderate               pulmonary hypertension No date: Helicobacter pylori gastritis No date: Hx of CABG     Comment:  2012 No date: Hyperlipidemia No date: Hypertension No date: Mitral regurgitation and aortic stenosis     Comment:  RHC 8/30 and echo 12/2019. No date: Myocardial infarction [facility]) No date: Neuropathy No date: Obesity No date: Peripheral vascular disease ([facility]CC)     Comment:  S/p extensive revascularization by vascular surgery               05/2019 No date: Post splenectomy syndrome No date: Psoriasis No date: Severe aortic stenosis     Comment:  03/21/20 RHC with peak gradient 27.4 mmHg and valve area               0.87. No date: Sleep apnea     Comment:  uses cpap No date: Stroke [facility]) No date: Tobacco use disorder     Comment:  recently quit 08/2013: Tubular adenoma     Comment:  Dr. Hilarie Fredrickson No date: Weak urinary stream   Reproductive/Obstetrics                             Anesthesia Physical  Anesthesia Plan  ASA: IV  Anesthesia Plan: MAC   Post-op Pain Management:    Induction: Intravenous  PONV Risk Score and Plan: 1 and Midazolam, Ondansetron and Treatment may  vary due to age or medical condition  Airway Management Planned: Nasal Cannula and Natural Airway  Additional Equipment: None  Intra-op Plan:   Post-operative Plan:   Informed Consent: I have reviewed the patients History and Physical, chart, labs and discussed the procedure including the risks, benefits and alternatives for the proposed anesthesia with the patient or authorized representative who has indicated his/her understanding and acceptance.     Dental Advisory Given  Plan Discussed with: CRNA and Surgeon  Anesthesia Plan Comments: (Patient with active recent cardiac process - s/p stent placement, with melena and anemia requiring blood transfusion. Per cardiology no further interventions or optimizations warranted for this non-elective procedure. Risks of continued anemia and case delay are greater than risks of proceeding  with case.  Discussed risks of anesthesia with patient, including possibility of difficulty with spontaneous ventilation under anesthesia necessitating airway intervention, PONV, and rare risks such as cardiac or respiratory or neurological events. Patient understands. Patient counseled on being higher risk for anesthesia due to comorbidities: CHF, very recent stent/MI, OSA. Patient was told about increased risk of cardiac and respiratory events, including death. Patient understands. )        Anesthesia Quick Evaluation

## 2020-05-20 NOTE — Progress Notes (Signed)
Jethro Bastos, PA for cardiology at bedside to assess pt.

## 2020-05-20 NOTE — Progress Notes (Signed)
Report to [place], Therapist, sports

## 2020-05-20 NOTE — Progress Notes (Signed)
RT at bedside to place pt on bipap

## 2020-05-20 NOTE — Progress Notes (Signed)
Dr. Francine Graven at bedside, pt states he is upset and wants to leave. Bi-pap taken off to see how well pt will tolerate per MD. This RN will continue to monitor

## 2020-05-20 NOTE — Consult Note (Signed)
Cardiology Consultation:   Patient ID: [name] [name] MRN: [id number]; DOB: 26-Nov-1955  Admit date: 05/20/2020 Date of Consult: 05/20/2020  Primary Care Provider: [name], MD Primary Cardiologist: [name], MD  Primary Electrophysiologist:  None   Patient Profile:   [name] [name] is a 64 y.o. male with a hx of HFrEF (EF 30 to 35%, 02/2020), CAD s/p CABG for three-vessel CAD (2012), aortic stenosis, mitral regurgitation, left-sided carotid endarterectomy, PAD followed by VVS, hypertension, hyperlipidemia, tobacco use, DM2, obesity, psoriasis, CKD/ESRD with plan to insert access for hemodialysis today 10/29, and who is being seen today for the evaluation of acute exacerbation of heart failure at the request of Dr. Francine Graven.  History of Present Illness:   [name] [name] is a 64 year old male with PMH as above.  He has a history of CAD s/p CABG in 2012 for 3V CAD, AS, MR, hypertension, hyperlipidemia, PAD, carotid artery disease s/p left endarterectomy, and multiple additional comorbid conditions as outlined above.  Unfortunately, his wife died of pancreatic cancer last year.  He has reported depression since that time.  R/LHC 03/21/2020 as below showed significant underlying 3B CAD with patent LIMA to LAD and SVG to OM 3.  Radial graft to RCA was occluded at the ostium with critical stenosis of the ostial native right coronary artery.  This was not the culprit for his N STEMI.  Also noted was severe aortic stenosis with peak gradient of 27.4 mmHg and valve area of 0.87.  Right heart cath showed moderately to severely elevated filling pressures with PCWP 31 mmHg, moderate PAH at 50/30 mmHg, normal CO at 5.06 with CI 2.36.  Prominent V waves on wedge pressure were suggestive of significant mitral regurgitation.  It was noted that he would need evaluated for TAVR plus RCA PCI.  Given his advanced CKD and anemia, in addition to significant heart failure, timing was ruled difficult to  coordinate.  It was recommended that he consider being initiated on hemodialysis in order to improve his volume status.  It was also noted that he needed a blood transfusion, given hemoglobin below 9.  He was likely to not tolerate large volume shifts due to significant CAD and aortic stenosis.  Case was discussed with the structural heart team at [facility].  It was noted vascular access was very difficult due to extensive stenting and calcifications of the lower extremities.  The SFA stent on the right side was thought possibly extending into the common femoral artery.  It was noted her arterial access might be easier via left femoral artery.   Echo 03/22/2020 and showed EF 30 to 35%, basal to mid inferior and inferoseptal akinesis, basal to mid inferolateral severe hypokinesis, mild LVH, G2DD, mildly elevated PASP, RVSF 37.4 mmHg, degenerative mitral valve with moderate to severe MR, tricuspid aortic valve with moderate aortic stenosis and mild regurgitation.  VTI measured 1.08 cm.  Aortic valve mean gradient 22.0 mmHg.  Aortic dilation was also noted with mild dilation of the ascending aorta at 42 mm.  RAP 8 mmHg.  He underwent stenting 03/23/2020 with DES to the ostial RCA.  Recommendations were to continue DAPT for at least 6 months.  He was permitted to undergo GI procedures from a cardiac standpoint, though ruled overall moderate risk.  It was noted that, although his aortic stenosis was severe, it did not seem critical at the time.  Plan was for possible initiation of hemodialysis to facilitate continued evaluation for TAVR.  LVEDP was 31  mmHg with diuresis increased to torsemide 40 mg twice daily. It was noted he had worsening heart failure post hospital discharge that improved with adjustment of his torsemide.  He was last seen in office 05/17/2020 by his primary cardiologist, Dr. Fletcher Anon.  It was noted he had a CT scan pre-TAVR that showed severely calcified trileaflet aortic valve and  suitable anatomy for TAVR.  He was evaluated by GI for GI bleed but not followed as an outpatient.  He continued to report intermittent blood in stool.  Plan was for him to see the TAVR team within the next month.  Follow-up with gastroenterology was recommended.  He was scheduled for AV fistula placement for dialysis 05/20/2020.  Cardiology was consulted today for acute onset shortness of breath, after which time the patient became very somnolent.  On cardiology consultation today.  He reports shortness of breath and is unable to tolerate being taken off the BiPAP during a trial off of BiPAP.  He reports orthopnea.  He denies any recent chest pain.  Unfortunately, as he is very somnolent and continues to fall asleep, no further HPI was collected.    On discussion with the nurses present at time of my evaluation, it was clarified that the patient was alert and oriented prior to his acute shortness of breath.  They reportedly gave him a dose of Versed for sedation prior to the procedure, at which time he developed work of breathing and accessory muscle use.  They placed a BiPAP on him with improvement in his saturations.  He was noted to be hypoglycemic and received D50.  He was also noted to be severely anemic with hemoglobin 7.5 and started on 1 unit of PRBC.   Given this acute shortness of breath occurred prior to his AV fistula placement, AV fistula has not yet been placed.  Chest x-ray was obtained and showed mild pulmonary edema with his procedure canceled and rescheduled.  He continues to be somnolent and appears to have altered mental status throughout my cardiology consultation.   Heart Pathway Score:     Past Medical History:  Diagnosis Date  . 3-vessel CAD    8/30 LHC with radial graft to RCA occluded at the ostium with critical stenosis of the ostial native RCA.  . Adenomatous colon polyp   . Allergy   . Anemia   . Anxiety   . Arthritis    RA  . Asthma   . Bell's palsy 2007    neuropathy  . CAD (coronary artery disease)    03/21/2020 LHC with patent LIMA to LAD and SVG to OM 3.  Radial graft to RCA occluded at the ostium with critical stenosis of the ostial and native RCA.  . Carotid artery occlusion    Bilateral carotid stenosis s/p left-sided CEA.  . Cataract    Dr. Dawna Part  . CKD (chronic kidney disease), stage IV ([place])    03/2020 vascular surgery access planned  . Depression   . Diabetes mellitus   . Diverticulosis   . Duodenitis   . DVT (deep venous thrombosis) (HCC)    in leg  . Dyspnea    with edema  . ESRD (end stage renal disease) ([place])   . Gastropathy   . GERD (gastroesophageal reflux disease)   . Heart failure with preserved ejection fraction ([place])    8/30 RHC with moderately to severely elevated filling pressures and pulmonary wedge pressure 31 mmHg, moderate pulmonary hypertension  . Heart murmur   . Helicobacter  pylori gastritis   . Hx of CABG    2012  . Hyperlipidemia   . Hypertension   . Mitral regurgitation and aortic stenosis    RHC 8/30 and echo 12/2019.  [name] Myocardial infarction ([place]) 2021  . Neuropathy   . Obesity   . Peripheral vascular disease ([place])    S/p extensive revascularization by vascular surgery 05/2019  . Post splenectomy syndrome   . Psoriasis   . Severe aortic stenosis    03/21/20 RHC with peak gradient 27.4 mmHg and valve area 0.87.  [name] Sleep apnea    uses cpap  . Stroke ([place])   . Tobacco use disorder    recently quit  . Tubular adenoma 08/2013   Dr. Hilarie Fredrickson  . Weak urinary stream     Past Surgical History:  Procedure Laterality Date  . CARDIAC CATHETERIZATION  08/2010   LAD: 80% ISR, RCA: 80% ostial, OM 80-90%  . CAROTID ENDARTERECTOMY  08/2010   left/ Dr. Kellie Simmering  . CATARACT EXTRACTION W/PHACO Left 07/09/2017   Procedure: CATARACT EXTRACTION PHACO AND INTRAOCULAR LENS PLACEMENT (IOC);  Surgeon: Birder Robson, MD;  Location: [facility] ORS;  Service: Ophthalmology;  Laterality: Left;  [place] 00:23 AP% 14.2 CDE  3.26 Fluid pack lot # [id number]  . CATARACT EXTRACTION W/PHACO Right 09/10/2017   Procedure: CATARACT EXTRACTION PHACO AND INTRAOCULAR LENS PLACEMENT (IOC);  Surgeon: Birder Robson, MD;  Location: [facility] ORS;  Service: Ophthalmology;  Laterality: Right;  [place] 00:26.4 AP% 17.3 CDE 4.59 Fluid Pack Lot # [id number]  . COLONOSCOPY    . CORONARY ANGIOPLASTY     LAD: before CABG  . CORONARY ARTERY BYPASS GRAFT  09/06/2010   At [facility]: LIMA to LAD, left radial to RCA, sequential SVG to OM3 and 4  . CORONARY STENT INTERVENTION N/A 03/23/2020   Procedure: CORONARY STENT INTERVENTION;  Surgeon: Wellington Hampshire, MD;  Location: [place] [facility];  Service: Cardiovascular;  Laterality: N/A;  RCA  . ESOPHAGOGASTRODUODENOSCOPY (EGD) WITH PROPOFOL N/A 04/11/2020   Procedure: ESOPHAGOGASTRODUODENOSCOPY (EGD) WITH PROPOFOL;  Surgeon: Lucilla Lame, MD;  Location: [facility] ENDOSCOPY;  Service: Endoscopy;  Laterality: N/A;  . heart stents  Jan 2011   leg stents 06/2009 and 03/2010  . LOWER EXTREMITY ANGIOGRAPHY Left 06/09/2019   Procedure: LOWER EXTREMITY ANGIOGRAPHY;  Surgeon: Katha Cabal, MD;  Location: [place] [facility];  Service: Cardiovascular;  Laterality: Left;  . PTA of illiac and SFA  multiple   Dr. Ronalee Belts, s/p revision 11/2012  . RIGHT AND LEFT HEART CATH N/A 03/21/2020   Procedure: RIGHT AND LEFT HEART CATH possible percutaneous intervention;  Surgeon: Wellington Hampshire, MD;  Location: [place] [facility];  Service: Cardiovascular;  Laterality: N/A;  . RIGHT/LEFT HEART CATH AND CORONARY/GRAFT ANGIOGRAPHY N/A 02/15/2020   Procedure: RIGHT/LEFT HEART CATH AND CORONARY/GRAFT ANGIOGRAPHY;  Surgeon: Wellington Hampshire, MD;  Location: [place] [facility];  Service: Cardiovascular;  Laterality: N/A;  . SPLENECTOMY    . VASCULAR SURGERY     LEG STENTS     Home Medications:  Prior to Admission medications   Medication Sig Start Date End Date Taking? Authorizing Provider  acetaminophen (TYLENOL) 500 MG  tablet Take 500 mg by mouth every 6 (six) hours as needed for moderate pain or headache.   Yes [provider]  albuterol (PROAIR HFA) 108 (90 Base) MCG/ACT inhaler Inhale 2 puffs into the lungs every 6 (six) hours as needed for shortness of breath. 09/14/19  Yes [name], MD  alum & mag hydroxide-simeth (MAALOX/MYLANTA) 200-200-20 MG/5ML suspension Take 30 mLs by mouth every 6 (six) hours as needed for indigestion or heartburn.   Yes [provider]  busPIRone (BUSPAR) 10 MG tablet TAKE 2 TABLETS(20 MG) BY MOUTH TWICE DAILY Patient taking differently: Take 20 mg by mouth 2 (two) times daily.  02/19/20  Yes [name], MD  calcitRIOL (ROCALTROL) 0.25 MCG capsule Take 0.25 mcg by mouth every Monday, Wednesday, and Friday.    Yes [provider]  carvedilol (COREG) 12.5 MG tablet Take 1 tablet by mouth at breakfast and 1 tablet at bedtime. 05/19/20  Yes Wellington Hampshire, MD  clopidogrel (PLAVIX) 75 MG tablet Take 1 tablet (75 mg total) by mouth daily. 03/25/20  Yes Sande Rives E, PA-C  cyclobenzaprine (FLEXERIL) 10 MG tablet Take 1 tablet (10 mg total) by mouth 3 (three) times daily as needed for muscle spasms. 05/19/20  Yes [name], MD  Dextran 70-Hypromellose (ARTIFICIAL TEARS) 0.1-0.3 % SOLN Place 1 drop into both eyes 4 (four) times daily as needed (dry eyes).   Yes [provider]  diphenhydrAMINE (BENADRYL) 25 MG tablet Take 50 mg by mouth daily as needed for allergies.   Yes [provider]  finasteride (PROSCAR) 5 MG tablet Take 5 mg by mouth daily.   Yes [provider]  hydrALAZINE (APRESOLINE) 25 MG tablet Take 1 tablet (25 mg total) by mouth every 8 (eight) hours. 05/18/20  Yes Wellington Hampshire, MD  insulin NPH Human (HUMULIN N) 100 UNIT/ML injection Inject 0.1-0.25 mLs (10-25 Units total) into the skin daily as needed (high blood sugar over 235). 05/19/20  Yes [name], MD  insulin regular (NOVOLIN R)  100 units/mL injection Inject 0.1-0.25 mLs (10-25 Units total) into the skin daily as needed (high blood sugar over 235). 05/19/20  Yes [name], MD  isosorbide mononitrate (IMDUR) 30 MG 24 hr tablet Take 1 tablet (30 mg total) by mouth daily. 05/19/20  Yes Wellington Hampshire, MD  nitroGLYCERIN (NITROSTAT) 0.4 MG SL tablet DISSOLVE 1 TABLET UNDER THE TONGUE EVERY 5 MINUTES AS NEEDED FOR CHEST PAIN. DO NOT EXCEED A TOTAL OF 3 DOSES IN 15 MINUTES. Patient taking differently: Place 0.4 mg under the tongue every 5 (five) minutes as needed for chest pain.  04/18/20  Yes Dunn, Areta Haber, PA-C  rosuvastatin (CRESTOR) 10 MG tablet Take 1 tablet (10 mg total) by mouth daily. 04/25/20  Yes Wellington Hampshire, MD  sertraline (ZOLOFT) 100 MG tablet Take 1 tablet (100 mg total) by mouth daily. 02/23/20  Yes [name], MD  tamsulosin (FLOMAX) 0.4 MG CAPS capsule Take 1 capsule by mouth daily Patient taking differently: Take 0.4 mg by mouth daily.  01/20/20  Yes [name], MD  torsemide (DEMADEX) 20 MG tablet Take 2 tablets (40 mg total) by mouth 2 (two) times daily. 04/07/20  Yes Loel Dubonnet, NP  CONTOUR TEST test strip USE TO check blood sugar ONCE DAILY AS DIRECTED 05/10/20   Philemon Kingdom, MD  insulin aspart (NOVOLOG) 100 UNIT/ML injection Inject 0-9 Units into the skin 3 (three) times daily with meals. 03/21/20   Ezekiel Slocumb, DO  Insulin Syringe-Needle U-100 30G X 15/64" 1 ML MISC Use to inject insulin as directed 05/19/20   [name], MD  predniSONE (DELTASONE) 50 MG tablet Take 13 hours, seven hours, and 1 hour prior to your CT. 04/25/20   Eileen Stanford, PA-C  Inpatient Medications: Scheduled Meds: . sodium chloride   Intravenous Once  . sodium chloride   Intravenous Once  . busPIRone  20 mg Oral BID  . calcitRIOL  0.25 mcg Oral Q M,W,F  . carvedilol  3.125 mg Oral BID WC  . clopidogrel  75 mg Oral Daily  . finasteride  5 mg Oral Daily  . furosemide  40 mg  Intravenous Q12H  . isosorbide mononitrate  30 mg Oral Daily  . lidocaine (PF)      . ropivacaine (PF) 5 mg/mL (0.5%)      . rosuvastatin  10 mg Oral Daily  . sertraline  100 mg Oral Daily  . sodium chloride flush  3 mL Intravenous Q12H  . tamsulosin  0.4 mg Oral Daily   Continuous Infusions: . sodium chloride 100 mL/hr at 05/20/20 0642  . sodium chloride    . clindamycin (CLEOCIN) IV     PRN Meds: sodium chloride, acetaminophen, albuterol, Dextran 70-Hypromellose, fentaNYL, midazolam, nitroGLYCERIN, ondansetron (ZOFRAN) IV, sodium chloride flush  Allergies:    Allergies  Allergen Reactions  . Contrast Media [Iodinated Diagnostic Agents] Rash and Other (See Comments)    Got very hot and red   . Glipizide Other (See Comments)    ANTIDIABETICS. Burning  . Hydroxychloroquine Other (See Comments)    Stomach upset  . Metrizamide Other (See Comments)    Got very hot and red  . Penicillins Hives and Swelling    Has patient had a PCN reaction causing immediate rash, facial/tongue/throat swelling, SOB or lightheadedness with hypotension: yes Has patient had a PCN reaction causing severe rash involving mucus membranes or skin necrosis: no  Has patient had a PCN reaction that required hospitalization: yes Has patient had a PCN reaction occurring within the last 10 years: no If all of the above answers are "NO", then may proceed with Cephalosporin use.     Social History:   Social History   Socioeconomic History  . Marital status: Widowed    Spouse name: Not on file  . Number of children: 2  . Years of education: Not on file  . Highest education level: Not on file  Occupational History  . Occupation: Chief Operating Officer at [employer]: Disabled mostly due to neuropathy  Tobacco Use  . Smoking status: Former Smoker    Packs/day: 0.25    Years: 40.00    Pack years: 10.00    Types: Cigarettes  . Smokeless tobacco: Never Used  . Tobacco comment: 1 cigarette a day  Vaping  Use  . Vaping Use: Never used  Substance and Sexual Activity  . Alcohol use: No  . Drug use: No  . Sexual activity: Never  Other Topics Concern  . Not on file  Social History Narrative   Wife died 2019/02/10      Has living will   Gearldine Shown and his wife Levada Dy should be his health care POA   Would accept resuscitation --but no prolonged ventilation   No tube feeds if cognitively unaware   Social Determinants of Health   Financial Resource Strain: Low Risk   . Difficulty of Paying Living Expenses: Not very hard  Food Insecurity: No Food Insecurity  . Worried About Charity fundraiser in the Last Year: Never true  . Ran Out of Food in the Last Year: Never true  Transportation Needs: No Transportation Needs  . Lack of Transportation (Medical): No  . Lack of Transportation (Non-Medical): No  Physical  Activity:   . Days of Exercise per Week: Not on file  . Minutes of Exercise per Session: Not on file  Stress: No Stress Concern Present  . Feeling of Stress : Only a little  Social Connections: Unknown  . Frequency of Communication with Friends and Family: More than three times a week  . Frequency of Social Gatherings with Friends and Family: Not on file  . Attends Religious Services: Not on file  . Active Member of Clubs or Organizations: Not on file  . Attends Archivist Meetings: Not on file  . Marital Status: Not on file  Intimate Partner Violence: Unknown  . Fear of Current or Ex-Partner: No  . Emotionally Abused: No  . Physically Abused: No  . Sexually Abused: Not on file    Family History:    Family History  Problem Relation Age of Onset  . Lung cancer Father 62  . Arthritis Father   . Breast cancer Sister 4  . Alcoholism Sister   . Dementia Mother   . Alzheimer's disease Mother   . Arthritis Mother   . Alcoholism Brother   . Epilepsy Sister   . Diabetes Paternal Grandfather   . Colon polyps Paternal Grandfather 75  . Alcoholism Sister   .  Alcoholism Sister   . Alcoholism Brother      ROS:  Please see the history of present illness.  Review of Systems  Unable to perform ROS: Severe respiratory distress  Respiratory: Positive for shortness of breath.   Cardiovascular: Positive for orthopnea. Negative for chest pain.    All other ROS reviewed and negative.     Physical Exam/Data:   Vitals:   05/20/20 0906 05/20/20 0913 05/20/20 0929 05/20/20 1009  BP: (!) 109/57 105/64 108/62   Pulse: 81 79 80   Resp: (!) 24 (!) 24 (!) 24 (!) 22  Temp:  (!) 97 F (36.1 C) (!) 97 F (36.1 C)   TempSrc:  Temporal Temporal   SpO2: 97% 100% 100% 100%    Intake/Output Summary (Last 24 hours) at 05/20/2020 1214 Last data filed at 05/20/2020 1041 Gross per 24 hour  Intake --  Output 200 ml  Net -200 ml   Last 3 Weights 05/17/2020 05/12/2020 04/27/2020  Weight (lbs) 225 lb 225 lb 230 lb  Weight (kg) 102.059 kg 102.059 kg 104.327 kg  Some encounter information is confidential and restricted. Go to Review Flowsheets activity to see all data.     There is no height or weight on file to calculate BMI.  General: Obese male, visibly uncomfortable, BiPAP in place, work of breathing noted.  Somnolent.  Unable to provide history. HEENT: normal, accessory muscle use noted on BiPAP. Neck: JVD difficult to assess due to body habitus Vascular: Unable to assess carotids due to BiPAP and heavy breathing on auscultation, radial pulses 2+ bilaterally Cardiac:  normal S1, S2; RRR; harsh 3/6 systolic murmur appreciated throughout the cardiac exam Lungs: Bibasilar crackles appreciated; however, exam proved difficult given he was on the BiPAP Abd: soft, nontender, no hepatomegaly  Ext: SCDs in place, moderate bilateral edema Musculoskeletal:  No deformities, BUE and BLE strength normal and equal Skin: warm and dry  Neuro: Unable to stay awake during my exam. Psych: AMS/somnolent.  Unable to stay awake during my exam.  EKG:  The EKG was personally  reviewed and demonstrates: NSR, 81 bpm, left bundle branch block, QRS 126, prolonged QTC at 504 ms, borderline LAFB, changes in inferior lateral leads consistent  with ischemia and more pronounced than 04-07-20 tracing Telemetry:  Telemetry was personally reviewed and demonstrates: Not currently on telemetry given he is being evaluated in perioperative 22  Relevant CV Studies: Echo 03/22/2020 1. Left ventricular ejection fraction, by estimation, is 30-35%. The left  ventricle has moderately decreased function. The left ventricle  demonstrates regional wall motion abnormalities, basal to mid inferior and  inferoseptal akinesis, basal to mid  inferolateral severe hypokinesis. The left ventricular internal cavity  size was mildly dilated. There is mild left ventricular hypertrophy. Left  ventricular diastolic parameters are consistent with Grade II diastolic  dysfunction (pseudonormalization).  2. Right ventricular systolic function is moderately reduced. The right  ventricular size is normal. There is mildly elevated pulmonary artery  systolic pressure. The estimated right ventricular systolic pressure is  13.2 mmHg.  3. Left atrial size was moderately dilated.  4. The mitral valve is degenerative. Moderate to severe mitral valve  regurgitation. No evidence of mitral stenosis.  5. The aortic valve is tricuspid. Aortic valve regurgitation is mild.  Moderate aortic valve stenosis. Aortic valve area, by VTI measures 1.08  cm. Aortic valve mean gradient measures 22.0 mmHg.  6. Aortic dilatation noted. There is mild dilatation of the ascending  aorta measuring 42 mm.  7. The inferior vena cava is dilated in size with >50% respiratory  variability, suggesting right atrial pressure of 8 mmHg.   Cardiac Cath 03/23/2020  Ost RCA to Prox RCA lesion is 99% stenosed.  Post intervention, there is a 0% residual stenosis.  A drug-eluting stent was successfully placed using a STENT    Laboratory Data:  High Sensitivity Troponin:  No results for input(s): TROPONINIHS in the last 720 hours.   Cardiac EnzymesNo results for input(s): TROPONINI in the last 168 hours. No results for input(s): TROPIPOC in the last 168 hours.  Chemistry Recent Labs  Lab 05/18/20 1057  NA 139  K 4.9  CL 105  CO2 26  GLUCOSE 185*  BUN 55*  CREATININE 3.25*  CALCIUM 8.5*  GFRNONAA 20*  ANIONGAP 8    No results for input(s): PROT, ALBUMIN, AST, ALT, ALKPHOS, BILITOT in the last 168 hours. Hematology Recent Labs  Lab 05/18/20 1057  WBC 8.2  RBC 2.55*  HGB 7.5*  HCT 24.3*  MCV 95.3  MCH 29.4  MCHC 30.9  RDW 16.6*  PLT 160   BNPNo results for input(s): BNP, PROBNP in the last 168 hours.  DDimer No results for input(s): DDIMER in the last 168 hours.   Radiology/Studies:  DG Chest Portable 1 View  Result Date: 05/20/2020 CLINICAL DATA:  Shortness of breath EXAM: PORTABLE CHEST 1 VIEW COMPARISON:  04/09/2020 FINDINGS: Post CABG changes. Stable cardiomediastinal contours. Pulmonary vascular congestion mild diffuse interstitial opacities throughout both lungs. Suspect trace bilateral pleural effusions. No pneumothorax. IMPRESSION: Findings suggestive of CHF with mild pulmonary edema. These results will be called to the ordering clinician or representative by the Radiologist Assistant, and communication documented in the PACS or [employer]. Electronically Signed   By: Davina Poke D.O.   On: 05/20/2020 08:19   [place] OR NERVE BLOCK-IMAGE ONLY [facility])  Result Date: 05/20/2020 There is no interpretation for this exam.  This order is for images obtained during a surgical procedure.  Please See "Surgeries" Tab for more information regarding the procedure.    Assessment and Plan:   Acute on chronic HFrEF --Currently SOB without CP. SOB was acute with reported onset after receiving Versed before scheduled AV fistula placement.  He was placed on BiPAP with IV fluids/D50  administered for low glucose, as well as started on blood transfusion. --Most recent 02/2020 echo as above with known severe aortic stenosis and mitral regurgitation, as well as moderately reduced LVSF at 30 to 35%, PAH, and elevated right heart pressures.  Could consider limited echo to reassess valvular function. --Chest x-ray significant for mild pulmonary edema. --Volume up on exam. He is likely unable to tolerate large shifts in fluid.  Recommend caution with administration of IV fluids and transfusion in close proximity to 1 another..   --Increased dose of IV diuresis from Lasix 40 mg twice daily to Lasix 80 mg twice daily, given his home dose of torsemide 40 mg twice daily at baseline. Recommend discontinue dextrose infusion, so that he is able to tolerate his IV transfusion volume shift.  --Daily BMET. Replete electrolytes as needed. --Continue current BiPAP. --Strict I's/O's. --Daily weights. --Continue current medications, including carvedilol, hydralazine.   --No ACE/ARB/Arni/MRA secondary to CKD.  Severe aortic stenosis --Recent echo as above with aortic valve area 1.08cm and aortic valve mean gradient 22.0 mmHg.  Currently undergoing evaluation for TAVR by structural heart team with most recent CT showing that showed severe calcification of trileaflet aortic valve and suitable anatomy for TAVR.  See HPI as above.  Given his severe valvular disease, he is unable to tolerate large shifts in fluid.  Caution with administration of IV D50/dextrose at the same time as blood transfusions.  Severe anemia, acute blood loss anemia --Currently being evaluated by GI for h/o possible GI bleed with report of melena and recent report of blood in his stool as outlined above in HPI.  At his last office visit, he was referred to GI.  Severe anemia likely contributing to SOB.  As above, he is unable to tolerate large shifts in fluid with recommendation for caution during any transfusion.  Maintain  hemoglobin above 8.0.  Given his recent PCI, recommend GI evaluation/EGD as preference is to maintain on Plavix given his recent stenting.  Holding ASA.  As previously noted, he is moderate risk for any noncardiac procedures.  Of note, he did have a 04/11/2020 upper endoscopy that showed diverticulum and small hiatal hernia without any active bleeding on review of note.  Continue to monitor H&H with daily CBC.  CAD s/p CABG in 2012 s/p recent PCI/DES to the RCA 03/23/2020 --No reported angina.  ASA held given hemoglobin.  Continue Plavix 75 mg, given his recent stenting. Continue rosuvastatin 10 mg and Imdur 30 mg daily.  ESRD --Followed by nephrology.  S/p vein mapping by Dr. Delana Meyer to obtain vascular access.  Was scheduled for AV fistula placement today to facilitate TAVR procedure as above.  AV fistula placement has been deferred given his acute shortness of breath.  Recommend nephrology consultation this admission as well.    HTN --BP well controlled.  Continue current medications.  HLD -Continue Crestor 10 mg.         New York Heart Association (NYHA) Functional Class NYHA Class IV         For questions or updates, please contact [facility] Please consult [email or web address] for contact info under     Signed, Arvil Chaco, PA-C  05/20/2020 12:14 PM

## 2020-05-20 NOTE — Interval H&P Note (Signed)
History and Physical Interval Note:  05/20/2020 7:29 AM  [name] [name]  has presented today for surgery, with the diagnosis of ESRD.  The various methods of treatment have been discussed with the patient and family. After consideration of risks, benefits and other options for treatment, the patient has consented to  Procedure(s): ARTERIOVENOUS (AV) FISTULA CREATION (BRACHIAL CEPHALIC ) (Left) as a surgical intervention.  The patient's history has been reviewed, patient examined, no change in status, stable for surgery.  I have reviewed the patient's chart and labs.  Questions were answered to the patient's satisfaction.     Hortencia Pilar

## 2020-05-21 ENCOUNTER — Encounter: Payer: Self-pay | Admitting: Internal Medicine

## 2020-05-21 DIAGNOSIS — N184 Chronic kidney disease, stage 4 (severe): Secondary | ICD-10-CM | POA: Diagnosis not present

## 2020-05-21 DIAGNOSIS — I5043 Acute on chronic combined systolic (congestive) and diastolic (congestive) heart failure: Secondary | ICD-10-CM

## 2020-05-21 DIAGNOSIS — D631 Anemia in chronic kidney disease: Secondary | ICD-10-CM | POA: Diagnosis not present

## 2020-05-21 DIAGNOSIS — K625 Hemorrhage of anus and rectum: Secondary | ICD-10-CM

## 2020-05-21 DIAGNOSIS — I350 Nonrheumatic aortic (valve) stenosis: Secondary | ICD-10-CM | POA: Diagnosis not present

## 2020-05-21 LAB — TYPE AND SCREEN
ABO/RH(D): AB POS
Antibody Screen: NEGATIVE
Extend sample reason: TRANSFUSED
Unit division: 0

## 2020-05-21 LAB — BASIC METABOLIC PANEL
Anion gap: 11 (ref 5–15)
BUN: 71 mg/dL — ABNORMAL HIGH (ref 8–23)
CO2: 22 mmol/L (ref 22–32)
Calcium: 7.9 mg/dL — ABNORMAL LOW (ref 8.9–10.3)
Chloride: 102 mmol/L (ref 98–111)
Creatinine, Ser: 3.79 mg/dL — ABNORMAL HIGH (ref 0.61–1.24)
GFR, Estimated: 17 mL/min — ABNORMAL LOW (ref >60)
Glucose, Bld: 153 mg/dL — ABNORMAL HIGH (ref 70–99)
Potassium: 3.9 mmol/L (ref 3.5–5.1)
Sodium: 135 mmol/L (ref 135–145)

## 2020-05-21 LAB — GLUCOSE, CAPILLARY
Glucose-Capillary: 149 mg/dL — ABNORMAL HIGH (ref 70–99)
Glucose-Capillary: 158 mg/dL — ABNORMAL HIGH (ref 70–99)
Glucose-Capillary: 127 mg/dL — ABNORMAL HIGH (ref 70–99)
Glucose-Capillary: 214 mg/dL — ABNORMAL HIGH (ref 70–99)
Glucose-Capillary: 220 mg/dL — ABNORMAL HIGH (ref 70–99)

## 2020-05-21 LAB — BPAM RBC
Blood Product Expiration Date: 202111112359
ISSUE DATE / TIME: 202110290906
Unit Type and Rh: 6200

## 2020-05-21 MED ORDER — INSULIN ASPART 100 UNIT/ML ~~LOC~~ SOLN
0.0000 [IU] | Freq: Every day | SUBCUTANEOUS | Status: DC
  Administered 2020-05-21 – 2020-05-23 (×3): 2 [IU] via SUBCUTANEOUS
  Filled 2020-05-21 (×3): qty 1

## 2020-05-21 MED ORDER — TORSEMIDE 20 MG PO TABS
60.0000 mg | ORAL_TABLET | Freq: Two times a day (BID) | ORAL | Status: DC
  Administered 2020-05-21 – 2020-05-22 (×3): 60 mg via ORAL
  Filled 2020-05-21 (×3): qty 3

## 2020-05-21 MED ORDER — ENSURE ENLIVE PO LIQD: 237.0000 mL | Freq: Two times a day (BID) | ORAL | Status: DC

## 2020-05-21 MED ORDER — OXYCODONE HCL 5 MG PO TABS
5.0000 mg | ORAL_TABLET | Freq: Once | ORAL | Status: AC
  Administered 2020-05-21: 5 mg via ORAL
  Filled 2020-05-21: qty 1

## 2020-05-21 MED ORDER — B COMPLEX-C PO TABS
1.0000 | ORAL_TABLET | Freq: Every day | ORAL | Status: DC
  Administered 2020-05-22 – 2020-05-26 (×5): 1 via ORAL
  Filled 2020-05-21 (×5): qty 1

## 2020-05-21 MED ORDER — INSULIN ASPART 100 UNIT/ML ~~LOC~~ SOLN
0.0000 [IU] | Freq: Three times a day (TID) | SUBCUTANEOUS | Status: DC
  Administered 2020-05-22: 3 [IU] via SUBCUTANEOUS
  Administered 2020-05-22 – 2020-05-23 (×4): 2 [IU] via SUBCUTANEOUS
  Administered 2020-05-24 (×3): 1 [IU] via SUBCUTANEOUS
  Administered 2020-05-25 (×2): 3 [IU] via SUBCUTANEOUS
  Administered 2020-05-25: 1 [IU] via SUBCUTANEOUS
  Administered 2020-05-26: 2 [IU] via SUBCUTANEOUS
  Filled 2020-05-21 (×12): qty 1

## 2020-05-21 NOTE — Progress Notes (Signed)
Progress Note    [name] [name]  HQP:[id number] DOB: 06-07-1956  DOA: 05/20/2020 PCP: [name], MD      Brief Narrative:    Medical records reviewed and are as summarized below:  [name] [name] is a 64 y.o. male       Assessment/Plan:   Principal Problem:   Acute on chronic combined systolic and diastolic CHF (congestive heart failure) (HCC) Active Problems:   DM (diabetes mellitus), type 2 with complications ([place])   CAD (coronary artery disease)   CKD (chronic kidney disease), stage IV (HCC)   Aortic stenosis   Anemia in chronic kidney disease   Acute on chronic combined systolic (congestive) and diastolic (congestive) heart failure (HCC)   Body mass index is 37.28 kg/m. (Morbid obesity)   Acute dissipation of chronic systolic and diastolic CHF, moderate aortic stenosis, moderate to severe mitral regurgitation: Patient lost his IV access.  He refused to have another peripheral IV placed.  Cardiologist recommended switching IV Lasix to oral torsemide for now.  Monitor BMP, urine output and daily weight.  Patient is being evaluated for possible TAVR. 2D echo in August 2021 showed EF estimated at 30 to 59%, grade 2 diastolic dysfunction, moderate to severe MR, moderate aortic stenosis  AKI on CKD stage IV: Continue oral torsemide.  Plan for placement of left AV fistula by vascular surgeon on Monday, May 23, 2020.  Follow-up with nephrologist.  GI bleeding/chronic anemia: Consulted gastroenterologist.  No plan for endoscopic work-up on this visit.  Monitor H&H and transfuse as needed.  Check iron studies.  No significant findings except for hiatal hernia on recent EGD on 04/11/2020.  Colonoscopy in October 2016 showed few polyps that were resected.  Insulin-dependent diabetes mellitus with recent hypoglycemia: Hold home insulin NPH.  Monitor glucose closely and use NovoLog as needed for hyperglycemia.  CAD s/p coronary stent and CABG: He is on  carvedilol, isosorbide mononitrate, Plavix and rosuvastatin  BPH: Continue finasteride and Flomax  OSA: CPAP at night and as needed for sleep.  Diet Order            Diet heart healthy/carb modified Room service appropriate? Yes; Fluid consistency: Thin  Diet effective now                    Consultants:  Cardiologist  Nephrologist  Vascular surgeon  Gastroenterologist  Procedures:  None    Medications:   . sodium chloride   Intravenous Once  . sodium chloride   Intravenous Once  . busPIRone  20 mg Oral BID  . calcitRIOL  0.25 mcg Oral Q M,W,F  . carvedilol  3.125 mg Oral BID WC  . clopidogrel  75 mg Oral Daily  . finasteride  5 mg Oral Daily  . isosorbide mononitrate  30 mg Oral Daily  . rosuvastatin  10 mg Oral Daily  . sertraline  100 mg Oral Daily  . sodium chloride flush  3 mL Intravenous Q12H  . tamsulosin  0.4 mg Oral Daily  . torsemide  60 mg Oral BID   Continuous Infusions: . sodium chloride       Anti-infectives (From admission, onward)   Start     Dose/Rate Route Frequency Ordered Stop   05/20/20 0630  clindamycin (CLEOCIN) IVPB 300 mg  Status:  Discontinued        300 mg 100 mL/hr over 30 Minutes Intravenous On call to O.R. 05/20/20 9357 05/20/20 1500  Family Communication/Anticipated D/C date and plan/Code Status   DVT prophylaxis: SCDs Start: 05/20/20 0946     Code Status: Full Code  Family Communication: None Disposition Plan:    Status is: Inpatient  Remains inpatient appropriate because:Ongoing diagnostic testing needed not appropriate for outpatient work up and Inpatient level of care appropriate due to severity of illness   Dispo: The patient is from: Home              Anticipated d/c is to: Home              Anticipated d/c date is: 3 days              Patient currently is not medically stable to d/c.           Subjective:   C/o black stools  Objective:    Vitals:   05/21/20 0459  05/21/20 0503 05/21/20 0744 05/21/20 1122  BP: 139/62 139/62 (!) 118/52 101/82  Pulse: 92 92 87 83  Resp: (!) 21 (!) 21 18 20   Temp: 98 F (36.7 C) 98 F (36.7 C) 97.8 F (36.6 C) 98.1 F (36.7 C)  TempSrc: Oral  Oral Oral  SpO2: 96% 96% 95% 96%  Weight:  104.8 kg    Height:       No data found.   Intake/Output Summary (Last 24 hours) at 05/21/2020 1448 Last data filed at 05/21/2020 0752 Gross per 24 hour  Intake 840 ml  Output 0 ml  Net 840 ml   Filed Weights   05/20/20 1458 05/21/20 0503  Weight: 112 kg 104.8 kg    Exam:  GEN: NAD SKIN: Warm and dry EYES: EOMI ENT: MMM CV: RRR PULM: CTA B ABD: soft, obese, NT, +BS CNS: AAO x 3, non focal EXT: B/l leg edema, no tenderness   Data Reviewed:   I have personally reviewed following labs and imaging studies:  Labs: Labs show the following:   Basic Metabolic Panel: Recent Labs  Lab 05/18/20 1057 05/21/20 0611  NA 139 135  K 4.9 3.9  CL 105 102  CO2 26 22  GLUCOSE 185* 153*  BUN 55* 71*  CREATININE 3.25* 3.79*  CALCIUM 8.5* 7.9*   GFR Estimated Creatinine Clearance: 21.7 mL/min (A) (by C-G formula based on SCr of 3.79 mg/dL (H)). Liver Function Tests: No results for input(s): AST, ALT, ALKPHOS, BILITOT, PROT, ALBUMIN in the last 168 hours. No results for input(s): LIPASE, AMYLASE in the last 168 hours. No results for input(s): AMMONIA in the last 168 hours. Coagulation profile Recent Labs  Lab 05/18/20 1057  INR 1.0    CBC: Recent Labs  Lab 05/18/20 1057  WBC 8.2  NEUTROABS 5.7  HGB 7.5*  HCT 24.3*  MCV 95.3  PLT 160   Cardiac Enzymes: No results for input(s): CKTOTAL, CKMB, CKMBINDEX, TROPONINI in the last 168 hours. BNP (last 3 results) No results for input(s): PROBNP in the last 8760 hours. CBG: Recent Labs  Lab 05/20/20 1913 05/20/20 2242 05/21/20 0502 05/21/20 0754 05/21/20 1139  GLUCAP 164* 142* 149* 158* 127*   D-Dimer: No results for input(s): DDIMER in the last 72  hours. Hgb A1c: No results for input(s): HGBA1C in the last 72 hours. Lipid Profile: No results for input(s): CHOL, HDL, LDLCALC, TRIG, CHOLHDL, LDLDIRECT in the last 72 hours. Thyroid function studies: No results for input(s): TSH, T4TOTAL, T3FREE, THYROIDAB in the last 72 hours.  Invalid input(s): FREET3 Anemia work up: No results for input(s): VITAMINB12,  FOLATE, FERRITIN, TIBC, IRON, RETICCTPCT in the last 72 hours. Sepsis Labs: Recent Labs  Lab 05/18/20 1057  WBC 8.2    Microbiology Recent Results (from the past 240 hour(s))  SARS CORONAVIRUS 2 (TAT 6-24 HRS) Nasopharyngeal Nasopharyngeal Swab     Status: None   Collection Time: 05/18/20 11:54 AM   Specimen: Nasopharyngeal Swab  Result Value Ref Range Status   SARS Coronavirus 2 NEGATIVE NEGATIVE Final    Comment: (NOTE) SARS-CoV-2 target nucleic acids are NOT DETECTED.  The SARS-CoV-2 RNA is generally detectable in upper and lower respiratory specimens during the acute phase of infection. Negative results do not preclude SARS-CoV-2 infection, do not rule out co-infections with other pathogens, and should not be used as the sole basis for treatment or other patient management decisions. Negative results must be combined with clinical observations, patient history, and epidemiological information. The expected result is Negative.  Fact Sheet for Patients: SugarRoll.be  Fact Sheet for Healthcare Providers: https://www.woods-mathews.com/  This test is not yet approved or cleared by the [place] FDA and  has been authorized for detection and/or diagnosis of SARS-CoV-2 by FDA under an Emergency Use Authorization (EUA). This EUA will remain  in effect (meaning this test can be used) for the duration of the COVID-19 declaration under Se ction 564(b)(1) of the Act, 21 U.S.C. section 360bbb-3(b)(1), unless the authorization is terminated or revoked sooner.  Performed at [facility], [place] [address] [place], [place] [address]     Procedures and diagnostic studies:  DG Chest Portable 1 View  Result Date: 05/20/2020 CLINICAL DATA:  Shortness of breath EXAM: PORTABLE CHEST 1 VIEW COMPARISON:  04/09/2020 FINDINGS: Post CABG changes. Stable cardiomediastinal contours. Pulmonary vascular congestion mild diffuse interstitial opacities throughout both lungs. Suspect trace bilateral pleural effusions. No pneumothorax. IMPRESSION: Findings suggestive of CHF with mild pulmonary edema. These results will be called to the ordering clinician or representative by the Radiologist Assistant, and communication documented in the PACS or [employer]. Electronically Signed   By: Davina Poke D.O.   On: 05/20/2020 08:19   [place] OR NERVE BLOCK-IMAGE ONLY [facility])  Result Date: 05/20/2020 There is no interpretation for this exam.  This order is for images obtained during a surgical procedure.  Please See "Surgeries" Tab for more information regarding the procedure.               LOS: 1 day   Raynald Rouillard  [facility]   Pager on [email or web address]. If 7PM-7AM, please contact night-coverage at [email or web address]     05/21/2020, 2:48 PM

## 2020-05-21 NOTE — Consult Note (Signed)
[name] [name] , MD [address], [place], [place], [place], [address] [address] [place], [place], [place], [address] Phone: [phone]  Fax: [phone]  Consultation  Referring Provider:     Dr Francine Graven Primary Care Physician:  [name], MD Primary GastroenterologistDr Pyrtle      Reason for Consultation:     Rectal bleeding  Date of Admission:  05/20/2020 Date of Consultation:  05/21/2020         HPI:   [name] [name] is a 64 y.o. male who has a history of CAD, CABG.  Status post recent PCI of the right coronary artery.  History of diabetes mellitus and stage IV CKD.  Severe aortic stenosis.  The patient has been in the process of having an AV fistula placed in preparation for anticipated dialysis.  During the procedure for the same there was some decrease in respiratory effort, hypoxia and subsequent chest x-ray showed features of congestive heart failure.  Patient is being treated for fluid overload.  The patient has been evaluated by cardiology.  Plan is for a TAVR procedure.  The reason I have been called in for blood loss anemia as the patient has  reported blood in the stool.  The patient has been on Plavix as well as aspirin.  This would need to be continued as the patient has had a recent drug-eluting stent.  Last echocardiogram on 03/22/2020 demonstrates an ejection fraction of the left ventricle 30 to 35%.  Diastolic dysfunction as well.  Right ventricular systolic function is moderately reduced.  Right ventricular systolic pressure is 25.3.  Moderate to severe mitral valve regurgitation.  Moderate aortic valve stenosis.   On 05/18/2020 hemoglobin of 7.5 g.  A month prior was 8.7 g.  INR 1.0.  I cannot see a repeat hemoglobin this morning.  Ferritin was 45.92 2021.  04/11/2020 EGD was performed by Dr. Allen Norris that demonstrated no gross abnormalities except a small hiatal hernia..  A colonoscopy was performed in October 2016.  And a few polyps were resected.  There was  moderate colonic diverticulosis.  The polyps were adenomatous.  He says that he has noticec blood on the tissue paper for many months, always after a bowel movement , suffers from constipation. Denies any other complaints.   Past Medical History:  Diagnosis Date  . 3-vessel CAD    8/30 LHC with radial graft to RCA occluded at the ostium with critical stenosis of the ostial native RCA.  . Adenomatous colon polyp   . Allergy   . Anemia   . Anxiety   . Arthritis    RA  . Asthma   . Bell's palsy 2007   neuropathy  . CAD (coronary artery disease)    03/21/2020 LHC with patent LIMA to LAD and SVG to OM 3.  Radial graft to RCA occluded at the ostium with critical stenosis of the ostial and native RCA.  . Carotid artery occlusion    Bilateral carotid stenosis s/p left-sided CEA.  . Cataract    Dr. Dawna Part  . CKD (chronic kidney disease), stage IV ([facility])    03/2020 vascular surgery access planned  . Depression   . Diabetes mellitus   . Diverticulosis   . Duodenitis   . DVT (deep venous thrombosis) ([facility])    in leg  . Dyspnea    with edema  . ESRD (end stage renal disease) ([facility])   . Gastropathy   . GERD (gastroesophageal reflux disease)   .  Heart failure with preserved ejection fraction ([facility])    8/30 RHC with moderately to severely elevated filling pressures and pulmonary wedge pressure 31 mmHg, moderate pulmonary hypertension  . Heart murmur   . Helicobacter pylori gastritis   . Hx of CABG    2012  . Hyperlipidemia   . Hypertension   . Mitral regurgitation and aortic stenosis    RHC 8/30 and echo 12/2019.  [name] Myocardial infarction ([facility]) 2021  . Neuropathy   . Obesity   . Peripheral vascular disease ([facility])    S/p extensive revascularization by vascular surgery 05/2019  . Post splenectomy syndrome   . Psoriasis   . Severe aortic stenosis    03/21/20 RHC with peak gradient 27.4 mmHg and valve area 0.87.  [name] Sleep apnea    uses cpap  . Stroke ([facility])   . Tobacco use disorder     recently quit  . Tubular adenoma 08/2013   Dr. Hilarie Fredrickson  . Weak urinary stream     Past Surgical History:  Procedure Laterality Date  . CARDIAC CATHETERIZATION  08/2010   LAD: 80% ISR, RCA: 80% ostial, OM 80-90%  . CAROTID ENDARTERECTOMY  08/2010   left/ Dr. Kellie Simmering  . CATARACT EXTRACTION W/PHACO Left 07/09/2017   Procedure: CATARACT EXTRACTION PHACO AND INTRAOCULAR LENS PLACEMENT (IOC);  Surgeon: Birder Robson, MD;  Location: [facility];  Service: Ophthalmology;  Laterality: Left;  [place] 00:23 AP% 14.2 CDE 3.26 Fluid pack lot # [id number]  . CATARACT EXTRACTION W/PHACO Right 09/10/2017   Procedure: CATARACT EXTRACTION PHACO AND INTRAOCULAR LENS PLACEMENT (IOC);  Surgeon: Birder Robson, MD;  Location: [facility];  Service: Ophthalmology;  Laterality: Right;  [place] 00:26.4 AP% 17.3 CDE 4.59 Fluid Pack Lot # [id number]  . COLONOSCOPY    . CORONARY ANGIOPLASTY     LAD: before CABG  . CORONARY ARTERY BYPASS GRAFT  09/06/2010   At [facility]: LIMA to LAD, left radial to RCA, sequential SVG to OM3 and 4  . CORONARY STENT INTERVENTION N/A 03/23/2020   Procedure: CORONARY STENT INTERVENTION;  Surgeon: Wellington Hampshire, MD;  Location: [facility];  Service: Cardiovascular;  Laterality: N/A;  RCA  . ESOPHAGOGASTRODUODENOSCOPY (EGD) WITH PROPOFOL N/A 04/11/2020   Procedure: ESOPHAGOGASTRODUODENOSCOPY (EGD) WITH PROPOFOL;  Surgeon: Lucilla Lame, MD;  Location: [facility];  Service: Endoscopy;  Laterality: N/A;  . heart stents  Jan 2011   leg stents 06/2009 and 03/2010  . LOWER EXTREMITY ANGIOGRAPHY Left 06/09/2019   Procedure: LOWER EXTREMITY ANGIOGRAPHY;  Surgeon: Katha Cabal, MD;  Location: [facility];  Service: Cardiovascular;  Laterality: Left;  . PTA of illiac and SFA  multiple   Dr. Ronalee Belts, s/p revision 11/2012  . RIGHT AND LEFT HEART CATH N/A 03/21/2020   Procedure: RIGHT AND LEFT HEART CATH possible percutaneous intervention;  Surgeon: Wellington Hampshire, MD;  Location: [facility];  Service: Cardiovascular;  Laterality: N/A;  . RIGHT/LEFT HEART CATH AND CORONARY/GRAFT ANGIOGRAPHY N/A 02/15/2020   Procedure: RIGHT/LEFT HEART CATH AND CORONARY/GRAFT ANGIOGRAPHY;  Surgeon: Wellington Hampshire, MD;  Location: [facility];  Service: Cardiovascular;  Laterality: N/A;  . SPLENECTOMY    . VASCULAR SURGERY     LEG STENTS    Prior to Admission medications   Medication Sig Start Date End Date Taking? Authorizing Provider  acetaminophen (TYLENOL) 500 MG tablet Take 500 mg by mouth every 6 (six) hours as needed for moderate pain or headache.   Yes [provider]  albuterol (PROAIR HFA) 108 (90 Base) MCG/ACT inhaler Inhale 2 puffs into the lungs every 6 (six) hours as needed for shortness of breath. 09/14/19  Yes [name], MD  alum & mag hydroxide-simeth (MAALOX/MYLANTA) 200-200-20 MG/5ML suspension Take 30 mLs by mouth every 6 (six) hours as needed for indigestion or heartburn.   Yes [provider]  busPIRone (BUSPAR) 10 MG tablet TAKE 2 TABLETS(20 MG) BY MOUTH TWICE DAILY Patient taking differently: Take 20 mg by mouth 2 (two) times daily.  02/19/20  Yes [name], MD  calcitRIOL (ROCALTROL) 0.25 MCG capsule Take 0.25 mcg by mouth every Monday, Wednesday, and Friday.    Yes [provider]  carvedilol (COREG) 12.5 MG tablet Take 1 tablet by mouth at breakfast and 1 tablet at bedtime. 05/19/20  Yes Wellington Hampshire, MD  clopidogrel (PLAVIX) 75 MG tablet Take 1 tablet (75 mg total) by mouth daily. 03/25/20  Yes Sande Rives E, PA-C  cyclobenzaprine (FLEXERIL) 10 MG tablet Take 1 tablet (10 mg total) by mouth 3 (three) times daily as needed for muscle spasms. 05/19/20  Yes [name], MD  Dextran 70-Hypromellose (ARTIFICIAL TEARS) 0.1-0.3 % SOLN Place 1 drop into both eyes 4 (four) times daily as needed (dry eyes).   Yes [provider]  diphenhydrAMINE (BENADRYL) 25 MG tablet Take 50 mg by mouth daily  as needed for allergies.   Yes [provider]  finasteride (PROSCAR) 5 MG tablet Take 5 mg by mouth daily.   Yes [provider]  hydrALAZINE (APRESOLINE) 25 MG tablet Take 1 tablet (25 mg total) by mouth every 8 (eight) hours. 05/18/20  Yes Wellington Hampshire, MD  insulin NPH Human (HUMULIN N) 100 UNIT/ML injection Inject 0.1-0.25 mLs (10-25 Units total) into the skin daily as needed (high blood sugar over 235). 05/19/20  Yes [name], MD  insulin regular (NOVOLIN R) 100 units/mL injection Inject 0.1-0.25 mLs (10-25 Units total) into the skin daily as needed (high blood sugar over 235). 05/19/20  Yes [name], MD  isosorbide mononitrate (IMDUR) 30 MG 24 hr tablet Take 1 tablet (30 mg total) by mouth daily. 05/19/20  Yes Wellington Hampshire, MD  nitroGLYCERIN (NITROSTAT) 0.4 MG SL tablet DISSOLVE 1 TABLET UNDER THE TONGUE EVERY 5 MINUTES AS NEEDED FOR CHEST PAIN. DO NOT EXCEED A TOTAL OF 3 DOSES IN 15 MINUTES. Patient taking differently: Place 0.4 mg under the tongue every 5 (five) minutes as needed for chest pain.  04/18/20  Yes Dunn, Areta Haber, PA-C  rosuvastatin (CRESTOR) 10 MG tablet Take 1 tablet (10 mg total) by mouth daily. 04/25/20  Yes Wellington Hampshire, MD  sertraline (ZOLOFT) 100 MG tablet Take 1 tablet (100 mg total) by mouth daily. 02/23/20  Yes [name], MD  tamsulosin (FLOMAX) 0.4 MG CAPS capsule Take 1 capsule by mouth daily Patient taking differently: Take 0.4 mg by mouth daily.  01/20/20  Yes [name], MD  torsemide (DEMADEX) 20 MG tablet Take 2 tablets (40 mg total) by mouth 2 (two) times daily. 04/07/20  Yes Loel Dubonnet, NP  CONTOUR TEST test strip USE TO check blood sugar ONCE DAILY AS DIRECTED 05/10/20   Philemon Kingdom, MD  insulin aspart (NOVOLOG) 100 UNIT/ML injection Inject 0-9 Units into the skin 3 (three) times daily with meals. 03/21/20   Ezekiel Slocumb, DO  Insulin Syringe-Needle U-100 30G X 15/64" 1 ML MISC Use to  inject insulin as directed 05/19/20  [name], MD  predniSONE (DELTASONE) 50 MG tablet Take 13 hours, seven hours, and 1 hour prior to your CT. 04/25/20   Eileen Stanford, PA-C    Family History  Problem Relation Age of Onset  . Lung cancer Father 89  . Arthritis Father   . Breast cancer Sister 52  . Alcoholism Sister   . Dementia Mother   . Alzheimer's disease Mother   . Arthritis Mother   . Alcoholism Brother   . Epilepsy Sister   . Diabetes Paternal Grandfather   . Colon polyps Paternal Grandfather 13  . Alcoholism Sister   . Alcoholism Sister   . Alcoholism Brother      Social History   Tobacco Use  . Smoking status: Former Smoker    Packs/day: 0.25    Years: 40.00    Pack years: 10.00    Types: Cigarettes  . Smokeless tobacco: Never Used  . Tobacco comment: 1 cigarette a day  Vaping Use  . Vaping Use: Never used  Substance Use Topics  . Alcohol use: No  . Drug use: No    Allergies as of 04/27/2020 - Review Complete 04/27/2020  Allergen Reaction Noted  . Contrast media [iodinated diagnostic agents] Rash and Other (See Comments) 08/06/2011  . Glipizide Other (See Comments) 03/05/2011  . Hydroxychloroquine Other (See Comments) 03/11/2020  . Metrizamide Other (See Comments) 03/25/2014  . Penicillins Hives and Swelling 08/23/2010    Review of Systems:    All systems reviewed and negative except where noted in HPI.   Physical Exam:  Vital signs in last 24 hours: Temp:  [97 F (36.1 C)-98.4 F (36.9 C)] 97.8 F (36.6 C) (10/30 0744) Pulse Rate:  [85-92] 87 (10/30 0744) Resp:  [18-22] 18 (10/30 0744) BP: (108-139)/(52-84) 118/52 (10/30 0744) SpO2:  [94 %-100 %] 95 % (10/30 0744) Weight:  [104.8 kg-112 kg] 104.8 kg (10/30 0503) Last BM Date: 05/20/20 General:   Pleasant, cooperative in NAD, on CPAP while in the room  Head:  Normocephalic and atraumatic. Eyes:   No icterus.   Conjunctiva pink. PERRLA. Ears:  Normal auditory acuity. Neck:   Supple; no masses or thyroidomegaly Lungs: Respirations even and unlabored. Air entry decreased b/l at the bases.   No wheezes, crackles, or rhonchi.  Heart:  Regular rate and rhythm;  Systolic murmur loud all over the precordium  Abdomen:  Soft, nondistended, nontender. Normal bowel sounds. No appreciable masses or hepatomegaly.  No rebound or guarding. B/l pedal edema + Neurologic:  Alert and oriented x3;  grossly normal neurologically. Skin:  Intact without significant lesions or rashes. Cervical Nodes:  No significant cervical adenopathy. Psych:  Alert and cooperative. Normal affect.  LAB RESULTS: Recent Labs    05/18/20 1057  WBC 8.2  HGB 7.5*  HCT 24.3*  PLT 160   BMET Recent Labs    05/18/20 1057 05/21/20 0611  NA 139 135  K 4.9 3.9  CL 105 102  CO2 26 22  GLUCOSE 185* 153*  BUN 55* 71*  CREATININE 3.25* 3.79*  CALCIUM 8.5* 7.9*   LFT No results for input(s): PROT, ALBUMIN, AST, ALT, ALKPHOS, BILITOT, BILIDIR, IBILI in the last 72 hours. PT/INR Recent Labs    05/18/20 1057  LABPROT 13.0  INR 1.0    STUDIES: DG Chest Portable 1 View  Result Date: 05/20/2020 CLINICAL DATA:  Shortness of breath EXAM: PORTABLE CHEST 1 VIEW COMPARISON:  04/09/2020 FINDINGS: Post CABG changes. Stable cardiomediastinal contours. Pulmonary vascular congestion mild  diffuse interstitial opacities throughout both lungs. Suspect trace bilateral pleural effusions. No pneumothorax. IMPRESSION: Findings suggestive of CHF with mild pulmonary edema. These results will be called to the ordering clinician or representative by the Radiologist Assistant, and communication documented in the PACS or [employer]. Electronically Signed   By: Davina Poke D.O.   On: 05/20/2020 08:19   [place] OR NERVE BLOCK-IMAGE ONLY [facility])  Result Date: 05/20/2020 There is no interpretation for this exam.  This order is for images obtained during a surgical procedure.  Please See "Surgeries" Tab for more  information regarding the procedure.      Impression / Plan:   [name] [name] is a 64 y.o. y/o male with multiple comorbidities including end-stage renal disease in preparation for dialysis, coronary artery disease with recent stent placement, obstructive sleep apnea, stroke, myocardial infarction, at least moderate to severe aortic stenosis.  Right heart failure.  Presented to the hospital with acute on chronic heart failure, noted to have a drop in hemoglobin by 1 g.  Some history of possible rectal bleeding associated with bowel movements which could be related to hemorroids .  The patient is on aspirin and Plavix.  Recent EGD showed no abnormalities performed by Dr. Allen Norris.  This patient is being evaluated for a TAVR procedure of the aortic valve.  Aortic stenosis and chronic kidney disease have been associated with a condition called Heydes syndromes which is an acquired von Willebrand factor disorder which leads to development of AVMs of the small bowel and associated bleeding and anemia.  If he truly has this condition then the treatment is to repair the aortic valve and the bleeding usually stops after the aortic valve is repaired.  With his  multiple comorbidities and inability to hold Plavix,  the risk of anesthesia is very high in addition I would not be able to perform any endoscopy hemostatic interventions due to ongoing use of Plavix.  I do understand that he has had a recent stent and Plavix cannot be discontinued unless having a life-threatening bleed.At present he also has acute on chronic heart failure and not safe to perform any endoscopy.   Plan 1.  Monitor CBC and transfuse. 2.  Check iron studies, B12 and if low would benefit from replacement.  Often patients with end-stage renal disease as well as aortic stenosis develop iron deficiency.  It will also help her heart failure. 3.  I would not recommend any endoscopy procedures unless having a significant bleed.  I do not recommend  checking stool for occult  blood testing as it is a test for colon cancer screening and is inappropriate in an inpatient setting and in her scenario. 4. Consider daily miralax for constipation , trial of anusol suppositories , sits bath for internal hemorrhoids.   Thank you for involving me in the care of this patient.      LOS: 1 day   [name] Bellows, MD  05/21/2020, 9:41 AM

## 2020-05-21 NOTE — Progress Notes (Signed)
Initial Nutrition Assessment  DOCUMENTATION CODES:   Obesity unspecified  INTERVENTION:  Ensure Enlive po BID, each supplement provides 350 kcal and 20 grams of protein  B-complex with C daily  Liberalize CM portion of diet   Will provide diet education prior to discharge as able   NUTRITION DIAGNOSIS:   Increased nutrient needs related to chronic illness (CKD stage IV s/p mapping pending AVF placement for initiation of HD) as evidenced by estimated needs.    GOAL:   Patient will meet greater than or equal to 90% of their needs    MONITOR:   Labs, I & O's, Supplement acceptance, PO intake, Weight trends  REASON FOR ASSESSMENT:   Malnutrition Screening Tool    ASSESSMENT:  RD working remotely.  64 year old male with history of CAD s/p CABG x 3 and recent PCI with stent angioplasty, chronic combined CHF, DM2, CKD stage IV, severe AS, and HTN who presented outpatient for creation of AV fistula  In preparation for HD when he developed abdominal breathing requiring noninvasive mechanical ventilation after receiving Versed for sedation prior to procedure. CXR suggestive of CHF with mild pulmonary edema, procedure cancelled and pt admitted with acute on chronic combined CHF.  Patient is s/p vein mapping to obtain vascular access, AV fistula placement has been tentatively deferred to Monday and diet advanced to HH/CM. Patient eating 50% x 1 documented intake this admission. Will order Ensure to aid with meeting needs. Recommend liberalizing CM portion of diet as last A1c on 7/24 and on 3/15 was 5.7  Per chart, weights have fluctuated 225-235 lbs in the past 2 months. Suspect this is secondary to fluid shifts given chronic combined CHF as well as CKD stage IV.  Currently pt weighs 104.8 kg (230.56 lbs), moderate BLE edema noted.  I/Os: +789.2 ml since admit UOP: 400 ml x 24 hrs Medications reviewed and include: Coreg, Plavix, Proscar, SSI, Imdur, Zoloft, Demadex  Labs: CBGs  220,214,127,158, BUN 71 (H), Cr 3.79 (H) 02/13/20 A1c 5.7  NUTRITION - FOCUSED PHYSICAL EXAM: Unable to complete at this time, RD working remotely.  Diet Order:   Diet Order            Diet heart healthy/carb modified Room service appropriate? Yes; Fluid consistency: Thin  Diet effective now                 EDUCATION NEEDS:   Not appropriate for education at this time  Skin:  Skin Assessment: Reviewed RN Assessment  Last BM:  10/29  Height:   Ht Readings from Last 1 Encounters:  05/20/20 5\' 6"  (1.676 m)    Weight:   Wt Readings from Last 1 Encounters:  05/21/20 104.8 kg    BMI:  Body mass index is 37.28 kg/m.  Estimated Nutritional Needs:   Kcal:  2200-2400  Protein:  110-120  Fluid:  UOP +1000 ml   Lajuan Lines, RD, LDN Clinical Nutrition After Hours/Weekend Pager # in [place]

## 2020-05-21 NOTE — Progress Notes (Signed)
Pt BS is 214. Pt is not on insulin. MD notified regarding the patients BS.

## 2020-05-21 NOTE — Plan of Care (Signed)

## 2020-05-21 NOTE — Progress Notes (Signed)
Pt have no IV access and refusing to get another IV access. Explained to the pt importance of having IV access and getting the IV Lasix. Pt continued to refused IV access. MD notified.

## 2020-05-21 NOTE — Progress Notes (Signed)
Progress Note  Patient Name: [name] [name] Date of Encounter: 05/21/2020  Primary Cardiologist: [name], MD   Subjective   He reports improved breathing status.  No chest pain, racing heart rate, or palpitations.  He states that the IV Lasix is causing him significant pain in his right arm.  He requests transition to oral medication.  Per rounding MD, this adjustment has been made as outlined below.    Inpatient Medications    Scheduled Meds: . sodium chloride   Intravenous Once  . sodium chloride   Intravenous Once  . busPIRone  20 mg Oral BID  . calcitRIOL  0.25 mcg Oral Q M,W,F  . carvedilol  3.125 mg Oral BID WC  . clopidogrel  75 mg Oral Daily  . finasteride  5 mg Oral Daily  . isosorbide mononitrate  30 mg Oral Daily  . rosuvastatin  10 mg Oral Daily  . sertraline  100 mg Oral Daily  . sodium chloride flush  3 mL Intravenous Q12H  . tamsulosin  0.4 mg Oral Daily  . torsemide  60 mg Oral BID   Continuous Infusions: . sodium chloride     PRN Meds: sodium chloride, acetaminophen, albuterol, cyclobenzaprine, fentaNYL, midazolam, nitroGLYCERIN, ondansetron (ZOFRAN) IV, polyvinyl alcohol, sodium chloride flush   Vital Signs    Vitals:   05/21/20 0459 05/21/20 0503 05/21/20 0744 05/21/20 1122  BP: 139/62 139/62 (!) 118/52 101/82  Pulse: 92 92 87 83  Resp: (!) 21 (!) 21 18 20   Temp: 98 F (36.7 C) 98 F (36.7 C) 97.8 F (36.6 C) 98.1 F (36.7 C)  TempSrc: Oral  Oral Oral  SpO2: 96% 96% 95% 96%  Weight:  104.8 kg    Height:        Intake/Output Summary (Last 24 hours) at 05/21/2020 1145 Last data filed at 05/21/2020 0752 Gross per 24 hour  Intake 1189.17 ml  Output 200 ml  Net 989.17 ml   Last 3 Weights 05/21/2020 05/20/2020 05/17/2020  Weight (lbs) 231 lb 247 lb 225 lb  Weight (kg) 104.781 kg 112.038 kg 102.059 kg  Some encounter information is confidential and restricted. Go to Review Flowsheets activity to see all data.      Telemetry      NSR, 60s to 80s- Personally Reviewed  ECG    No new tracings- Personally Reviewed  Physical Exam   GEN: No acute distress.  On BiPAP. Neck:  JVD difficult to assess due to body habitus Cardiac: RRR, 3/6 systolic murmur appreciated throughout the cardiac exam.  No rubs, or gallops.  Respiratory: Bilaterally reduced breath sounds, bibasilar crackles though improved from yesterday. GI: Soft, nontender, non-distended  MS: No edema, improved from yesterday; No deformity. Neuro:  Nonfocal  Psych: Normal affect   Labs    High Sensitivity Troponin:   Recent Labs  Lab 05/20/20 1509 05/20/20 1727  TROPONINIHS 70* 72*      Chemistry Recent Labs  Lab 05/18/20 1057 05/21/20 0611  NA 139 135  K 4.9 3.9  CL 105 102  CO2 26 22  GLUCOSE 185* 153*  BUN 55* 71*  CREATININE 3.25* 3.79*  CALCIUM 8.5* 7.9*  GFRNONAA 20* 17*  ANIONGAP 8 11     Hematology Recent Labs  Lab 05/18/20 1057  WBC 8.2  RBC 2.55*  HGB 7.5*  HCT 24.3*  MCV 95.3  MCH 29.4  MCHC 30.9  RDW 16.6*  PLT 160    BNPNo results for input(s): BNP, PROBNP in the  last 168 hours.   DDimer No results for input(s): DDIMER in the last 168 hours.   Radiology    DG Chest Portable 1 View  Result Date: 05/20/2020 CLINICAL DATA:  Shortness of breath EXAM: PORTABLE CHEST 1 VIEW COMPARISON:  04/09/2020 FINDINGS: Post CABG changes. Stable cardiomediastinal contours. Pulmonary vascular congestion mild diffuse interstitial opacities throughout both lungs. Suspect trace bilateral pleural effusions. No pneumothorax. IMPRESSION: Findings suggestive of CHF with mild pulmonary edema. These results will be called to the ordering clinician or representative by the Radiologist Assistant, and communication documented in the PACS or [employer]. Electronically Signed   By: Davina Poke D.O.   On: 05/20/2020 08:19   [place] OR NERVE BLOCK-IMAGE ONLY [facility])  Result Date: 05/20/2020 There is no interpretation for this exam.   This order is for images obtained during a surgical procedure.  Please See "Surgeries" Tab for more information regarding the procedure.    Cardiac Studies   Echo 03/22/2020 1. Left ventricular ejection fraction, by estimation, is 30-35%. The left  ventricle has moderately decreased function. The left ventricle  demonstrates regional wall motion abnormalities, basal to mid inferior and  inferoseptal akinesis, basal to mid  inferolateral severe hypokinesis. The left ventricular internal cavity  size was mildly dilated. There is mild left ventricular hypertrophy. Left  ventricular diastolic parameters are consistent with Grade II diastolic  dysfunction (pseudonormalization).  2. Right ventricular systolic function is moderately reduced. The right  ventricular size is normal. There is mildly elevated pulmonary artery  systolic pressure. The estimated right ventricular systolic pressure is  19.4 mmHg.  3. Left atrial size was moderately dilated.  4. The mitral valve is degenerative. Moderate to severe mitral valve  regurgitation. No evidence of mitral stenosis.  5. The aortic valve is tricuspid. Aortic valve regurgitation is mild.  Moderate aortic valve stenosis. Aortic valve area, by VTI measures 1.08  cm. Aortic valve mean gradient measures 22.0 mmHg.  6. Aortic dilatation noted. There is mild dilatation of the ascending  aorta measuring 42 mm.  7. The inferior vena cava is dilated in size with >50% respiratory  variability, suggesting right atrial pressure of 8 mmHg.   Cardiac Cath 03/23/2020  Ost RCA to Prox RCA lesion is 99% stenosed.  Post intervention, there is a 0% residual stenosis.  A drug-eluting stent was successfully placed using a STENT   Patient Profile     64 y.o. male with hx of HFrEF (EF 30 to 35%, 02/2020), CAD s/p CABG for three-vessel CAD (2012), aortic stenosis, mitral regurgitation, left-sided carotid endarterectomy, PAD followed by VVS, hypertension,  hyperlipidemia, tobacco use, DM2, obesity, psoriasis, CKD/ESRD with plan to insert access for hemodialysis today 10/29, and who is being seen today for the evaluation of acute exacerbation of heart failure.  Assessment & Plan    Acute on chronic HFrEF --SOB was acute with reported onset after receiving Versed before scheduled AV fistula placement.  He was placed on BiPAP with IV fluids/D50 administered for low glucose, as well as started on blood transfusion.  Cardiology recommendation was to discontinue IV fluids and slow transfusion with improvement in AMS following this change. Most recent 02/2020 echo as above with known severe aortic stenosis and mitral regurgitation, as well as moderately reduced LVSF at 30 to 35%, PAH, and elevated right heart pressures.   --Unable to tolerate large shifts in fluid.  Recommend caution with further administration of IV fluids and transfusion in close proximity. --Per rounding MD, transitioned to oral torsemide  60 mg twice daily.  PTA torsemide 40 mg twice daily.  --Daily BMET. Replete electrolytes as needed. --Continue current BiPAP. --Strict I's/O's. --Daily weights. --Continue current medications, including carvedilol, hydralazine.   --No ACE/ARB/Arni/MRA secondary to CKD.  Severe aortic stenosis --Recent echo as above with aortic valve area 1.08cm and aortic valve mean gradient 22.0 mmHg.  Currently undergoing evaluation for TAVR by structural heart team with most recent CT showing that showed severe calcification of trileaflet aortic valve and suitable anatomy for TAVR.  See HPI as above.  Given his severe valvular disease, he is unable to tolerate large shifts in fluid.  Caution with administration of IV D50/dextrose at the same time as blood transfusions.  Severe anemia, acute blood loss anemia --Currently being evaluated by GI for h/o possible GI bleed with report of melena and recent report of blood in his stool as outlined above in HPI.  At his  last office visit, he was referred to GI.  Severe anemia likely contributing to SOB.  As above, he is unable to tolerate large shifts in fluid with recommendation for caution during any transfusion.  Maintain hemoglobin above 8.0.  Continue to monitor H&H with daily CBC.  CAD s/p CABG in 2012 s/p recent PCI/DES to the RCA 03/23/2020 --No reported angina.  ASA held given hemoglobin.  Continue Plavix 75 mg, given his recent stenting. Continue rosuvastatin 10 mg and Imdur 30 mg daily.  ESRD --Followed by nephrology.  S/p vein mapping by Dr. Delana Meyer to obtain vascular access.  Was scheduled for AV fistula placement today to facilitate TAVR procedure as above.  AV fistula placement has been deferred given his acute shortness of breath.  Nephrology consulted.  HTN --BP well controlled.  Continue current medications.  HLD -Continue Crestor 10 mg.         New York Heart Association (NYHA) Functional Class NYHA Class IV    For questions or updates, please contact [facility] Please consult [email or web address] for contact info under        Signed, Arvil Chaco, PA-C  05/21/2020, 11:45 AM

## 2020-05-21 NOTE — Progress Notes (Signed)
   05/21/20 1735  ECG Monitoring  QTc interval 533   MD notified.

## 2020-05-21 NOTE — Progress Notes (Signed)
[facility]  ROUNDING NOTE   Subjective:   The patient reports that his difficulty breahing " comes and goes." He denies any current shortness of breath.  He presented for an AVF placement yesterday and became unstable with increased work of breathing.  He reports that he was told that the procedure may not happen now until Monday.  He reports that he will not get another IV.  The patient is known to the practice and is a patient of Dr. Juleen [name].   Objective:  Vital signs in last 24 hours:  Temp:  [97.8 F (36.6 C)-98.4 F (36.9 C)] 97.9 F (36.6 C) (10/30 1503) Pulse Rate:  [83-92] 88 (10/30 1503) Resp:  [18-21] 20 (10/30 1503) BP: (101-139)/(52-82) 136/66 (10/30 1503) SpO2:  [95 %-100 %] 100 % (10/30 1503) Weight:  [104.8 kg] 104.8 kg (10/30 0503)  Weight change:  Filed Weights   05/20/20 1458 05/21/20 0503  Weight: 112 kg 104.8 kg    Intake/Output: I/O last 3 completed shifts: In: 1189.2 [P.O.:890; Blood:299.2] Out: 400 [Urine:400]   Intake/Output this shift:  Total I/O In: -  Out: 300 [Urine:300]  Physical Exam: General: NAD,   Head: Normocephalic, atraumatic. Moist oral mucosal membranes  Eyes: Anicteric, PERRL  Neck: Supple, trachea midline  Lungs:  Clear to auscultation  Heart: Regular rate and rhythm. +systolic murmur   Abdomen:  Soft, nontender,   Extremities:  2+ peripheral edema.  Neurologic: Nonfocal, moving all four extremities  Skin: No lesions       Basic Metabolic Panel: Recent Labs  Lab 05/18/20 1057 05/21/20 0611  NA 139 135  K 4.9 3.9  CL 105 102  CO2 26 22  GLUCOSE 185* 153*  BUN 55* 71*  CREATININE 3.25* 3.79*  CALCIUM 8.5* 7.9*    Liver Function Tests: No results for input(s): AST, ALT, ALKPHOS, BILITOT, PROT, ALBUMIN in the last 168 hours. No results for input(s): LIPASE, AMYLASE in the last 168 hours. No results for input(s): AMMONIA in the last 168 hours.  CBC: Recent Labs  Lab 05/18/20 1057  WBC 8.2   NEUTROABS 5.7  HGB 7.5*  HCT 24.3*  MCV 95.3  PLT 160    Cardiac Enzymes: No results for input(s): CKTOTAL, CKMB, CKMBINDEX, TROPONINI in the last 168 hours.  BNP: Invalid input(s): POCBNP  CBG: Recent Labs  Lab 05/20/20 2242 05/21/20 0502 05/21/20 0754 05/21/20 1139 05/21/20 1638  GLUCAP 142* 149* 158* 127* 214*    Microbiology: Results for orders placed or performed during the hospital encounter of 05/18/20  SARS CORONAVIRUS 2 (TAT 6-24 HRS) Nasopharyngeal Nasopharyngeal Swab     Status: None   Collection Time: 05/18/20 11:54 AM   Specimen: Nasopharyngeal Swab  Result Value Ref Range Status   SARS Coronavirus 2 NEGATIVE NEGATIVE Final    Comment: (NOTE) SARS-CoV-2 target nucleic acids are NOT DETECTED.  The SARS-CoV-2 RNA is generally detectable in upper and lower respiratory specimens during the acute phase of infection. Negative results do not preclude SARS-CoV-2 infection, do not rule out co-infections with other pathogens, and should not be used as the sole basis for treatment or other patient management decisions. Negative results must be combined with clinical observations, patient history, and epidemiological information. The expected result is Negative.  Fact Sheet for Patients: SugarRoll.be  Fact Sheet for Healthcare Providers: https://www.woods-mathews.com/  This test is not yet approved or cleared by the [place] FDA and  has been authorized for detection and/or diagnosis of SARS-CoV-2 by FDA under an  Emergency Use Authorization (EUA). This EUA will remain  in effect (meaning this test can be used) for the duration of the COVID-19 declaration under Se ction 564(b)(1) of the Act, 21 U.S.C. section 360bbb-3(b)(1), unless the authorization is terminated or revoked sooner.  Performed at [facility], [address], [place], [place] [address]    *Note: Due to a large number of results and/or  encounters for the requested time period, some results have not been displayed. A complete set of results can be found in Results Review.    Coagulation Studies: No results for input(s): LABPROT, INR in the last 72 hours.  Urinalysis: No results for input(s): COLORURINE, LABSPEC, PHURINE, GLUCOSEU, HGBUR, BILIRUBINUR, KETONESUR, PROTEINUR, UROBILINOGEN, NITRITE, LEUKOCYTESUR in the last 72 hours.  Invalid input(s): APPERANCEUR    Imaging: DG Chest Portable 1 View  Result Date: 05/20/2020 CLINICAL DATA:  Shortness of breath EXAM: PORTABLE CHEST 1 VIEW COMPARISON:  04/09/2020 FINDINGS: Post CABG changes. Stable cardiomediastinal contours. Pulmonary vascular congestion mild diffuse interstitial opacities throughout both lungs. Suspect trace bilateral pleural effusions. No pneumothorax. IMPRESSION: Findings suggestive of CHF with mild pulmonary edema. These results will be called to the ordering clinician or representative by the Radiologist Assistant, and communication documented in the PACS or [employer]. Electronically Signed   By: Davina Poke D.O.   On: 05/20/2020 08:19   [place] OR NERVE BLOCK-IMAGE ONLY [facility])  Result Date: 05/20/2020 There is no interpretation for this exam.  This order is for images obtained during a surgical procedure.  Please See "Surgeries" Tab for more information regarding the procedure.     Medications:   . sodium chloride     . sodium chloride   Intravenous Once  . sodium chloride   Intravenous Once  . busPIRone  20 mg Oral BID  . calcitRIOL  0.25 mcg Oral Q M,W,F  . carvedilol  3.125 mg Oral BID WC  . clopidogrel  75 mg Oral Daily  . finasteride  5 mg Oral Daily  . isosorbide mononitrate  30 mg Oral Daily  . rosuvastatin  10 mg Oral Daily  . sertraline  100 mg Oral Daily  . sodium chloride flush  3 mL Intravenous Q12H  . tamsulosin  0.4 mg Oral Daily  . torsemide  60 mg Oral BID   sodium chloride, acetaminophen, albuterol, cyclobenzaprine,  fentaNYL, midazolam, nitroGLYCERIN, ondansetron (ZOFRAN) IV, polyvinyl alcohol, sodium chloride flush  Assessment/ Plan:  [name] is a 64 y.o.  male with a history of PVD, Hypertension, Depression, severe aortic stenosis, obesity, DM type II, COPD, CAD with stent placement,  anemia and CKD stage IV who is admitted for Acute on chronic combined systolic and diastolic CHF.   1. Chronic Kidney disease stage IV - patients baseline creatinine is approximately 3.4 - planning on pursing dialysis in the future.   - continue to monitor kidney function with diuresis.   2. Anemia of CKD , Hgb was 9.4 ( 9/10)  , 7.9 ( 10/25) - hgb 7.5  - has also been seen by GI this hospitalization.   3. Hypertension  - currently on carvedilol , Imdur,    4. Secondary hyperparathyroidism  - continue calcitriol    LOS: [place] 10/30/20215:37 PM

## 2020-05-22 DIAGNOSIS — D631 Anemia in chronic kidney disease: Secondary | ICD-10-CM | POA: Diagnosis not present

## 2020-05-22 DIAGNOSIS — I5043 Acute on chronic combined systolic (congestive) and diastolic (congestive) heart failure: Secondary | ICD-10-CM | POA: Diagnosis not present

## 2020-05-22 DIAGNOSIS — I350 Nonrheumatic aortic (valve) stenosis: Secondary | ICD-10-CM | POA: Diagnosis not present

## 2020-05-22 DIAGNOSIS — N184 Chronic kidney disease, stage 4 (severe): Secondary | ICD-10-CM

## 2020-05-22 LAB — CBC WITH DIFFERENTIAL/PLATELET
Abs Immature Granulocytes: 0.02 10*3/uL (ref 0.00–0.07)
Basophils Absolute: 0 10*3/uL (ref 0.0–0.1)
Basophils Relative: 0 %
Eosinophils Absolute: 0.1 10*3/uL (ref 0.0–0.5)
Eosinophils Relative: 2 %
HCT: 18.9 % — ABNORMAL LOW (ref 39.0–52.0)
Hemoglobin: 5.9 g/dL — ABNORMAL LOW (ref 13.0–17.0)
Immature Granulocytes: 0 %
Lymphocytes Relative: 22 %
Lymphs Abs: 1.4 10*3/uL (ref 0.7–4.0)
MCH: 29.4 pg (ref 26.0–34.0)
MCHC: 31.2 g/dL (ref 30.0–36.0)
MCV: 94 fL (ref 80.0–100.0)
Monocytes Absolute: 0.5 10*3/uL (ref 0.1–1.0)
Monocytes Relative: 9 %
Neutro Abs: 4.2 10*3/uL (ref 1.7–7.7)
Neutrophils Relative %: 67 %
Platelets: 121 10*3/uL — ABNORMAL LOW (ref 150–400)
RBC: 2.01 MIL/uL — ABNORMAL LOW (ref 4.22–5.81)
RDW: 17 % — ABNORMAL HIGH (ref 11.5–15.5)
WBC: 6.2 10*3/uL (ref 4.0–10.5)
nRBC: 0 % (ref 0.0–0.2)

## 2020-05-22 LAB — BASIC METABOLIC PANEL
Anion gap: 12 (ref 5–15)
BUN: 78 mg/dL — ABNORMAL HIGH (ref 8–23)
CO2: 24 mmol/L (ref 22–32)
Calcium: 7.9 mg/dL — ABNORMAL LOW (ref 8.9–10.3)
Chloride: 100 mmol/L (ref 98–111)
Creatinine, Ser: 3.94 mg/dL — ABNORMAL HIGH (ref 0.61–1.24)
GFR, Estimated: 16 mL/min — ABNORMAL LOW (ref >60)
Glucose, Bld: 203 mg/dL — ABNORMAL HIGH (ref 70–99)
Potassium: 4 mmol/L (ref 3.5–5.1)
Sodium: 136 mmol/L (ref 135–145)

## 2020-05-22 LAB — FERRITIN: Ferritin: 31 ng/mL (ref 24–336)

## 2020-05-22 LAB — IRON AND TIBC
Iron: 43 ug/dL — ABNORMAL LOW (ref 45–182)
Saturation Ratios: 10 % — ABNORMAL LOW (ref 17.9–39.5)
TIBC: 441 ug/dL (ref 250–450)
UIBC: 398 ug/dL

## 2020-05-22 LAB — GLUCOSE, CAPILLARY
Glucose-Capillary: 159 mg/dL — ABNORMAL HIGH (ref 70–99)
Glucose-Capillary: 174 mg/dL — ABNORMAL HIGH (ref 70–99)
Glucose-Capillary: 176 mg/dL — ABNORMAL HIGH (ref 70–99)
Glucose-Capillary: 202 mg/dL — ABNORMAL HIGH (ref 70–99)

## 2020-05-22 LAB — PREPARE RBC (CROSSMATCH)

## 2020-05-22 MED ORDER — OXYCODONE HCL 5 MG PO TABS
5.0000 mg | ORAL_TABLET | Freq: Four times a day (QID) | ORAL | Status: DC | PRN
  Administered 2020-05-22 – 2020-05-25 (×8): 5 mg via ORAL
  Filled 2020-05-22 (×8): qty 1

## 2020-05-22 MED ORDER — FUROSEMIDE 10 MG/ML IJ SOLN
80.0000 mg | Freq: Two times a day (BID) | INTRAMUSCULAR | Status: DC
  Administered 2020-05-22 – 2020-05-23 (×2): 80 mg via INTRAVENOUS
  Filled 2020-05-22 (×2): qty 8

## 2020-05-22 MED ORDER — SODIUM CHLORIDE 0.9% IV SOLUTION: Freq: Once | INTRAVENOUS | Status: AC

## 2020-05-22 MED ORDER — ENSURE ENLIVE PO LIQD
237.0000 mL | ORAL | Status: DC
  Administered 2020-05-22 – 2020-05-26 (×3): 237 mL via ORAL

## 2020-05-22 NOTE — Progress Notes (Signed)
Progress Note  Patient Name: [name] [name] Date of Encounter: 05/22/2020  Primary Cardiologist: [name], MD   Subjective  Not much UOP on oral therapy. Patient is now agreeable to have IV replaced and receive IV diuretics.   Inpatient Medications    Scheduled Meds: . sodium chloride   Intravenous Once  . sodium chloride   Intravenous Once  . B-complex with vitamin C  1 tablet Oral Daily  . busPIRone  20 mg Oral BID  . calcitRIOL  0.25 mcg Oral Q M,W,F  . carvedilol  3.125 mg Oral BID WC  . clopidogrel  75 mg Oral Daily  . feeding supplement  237 mL Oral Q24H  . finasteride  5 mg Oral Daily  . insulin aspart  0-5 Units Subcutaneous QHS  . insulin aspart  0-9 Units Subcutaneous TID WC  . isosorbide mononitrate  30 mg Oral Daily  . rosuvastatin  10 mg Oral Daily  . sertraline  100 mg Oral Daily  . sodium chloride flush  3 mL Intravenous Q12H  . tamsulosin  0.4 mg Oral Daily  . torsemide  60 mg Oral BID   Continuous Infusions: . sodium chloride     PRN Meds: sodium chloride, acetaminophen, albuterol, cyclobenzaprine, fentaNYL, midazolam, nitroGLYCERIN, ondansetron (ZOFRAN) IV, polyvinyl alcohol, sodium chloride flush   Vital Signs    Vitals:   05/21/20 1503 05/21/20 2021 05/22/20 0500 05/22/20 0735  BP: 136/66 140/78 126/72 133/76  Pulse: 88 82 100 88  Resp: 20 17 16 17   Temp: 97.9 F (36.6 C) 97.7 F (36.5 C) 97.9 F (36.6 C) 98.1 F (36.7 C)  TempSrc: Oral Oral Oral Oral  SpO2: 100% 100% 100% 100%  Weight:   104.4 kg   Height:        Intake/Output Summary (Last 24 hours) at 05/22/2020 1100 Last data filed at 05/22/2020 1040 Gross per 24 hour  Intake 1000 ml  Output 1200 ml  Net -200 ml   Last 3 Weights 05/22/2020 05/21/2020 05/20/2020  Weight (lbs) 230 lb 2.6 oz 231 lb 247 lb  Weight (kg) 104.4 kg 104.781 kg 112.038 kg  Some encounter information is confidential and restricted. Go to Review Flowsheets activity to see all data.       Telemetry    Personally Reviewed  ECG    No new tracings- Personally Reviewed  Physical Exam   GEN: No acute distress.  On BiPAP. Neck:  JVD difficult to assess due to body habitus Cardiac: RRR, 3/6 systolic murmur appreciated throughout the cardiac exam.  No rubs, or gallops.  Respiratory: Bilaterally reduced breath sounds, bibasilar crackles though improved from yesterday. GI: Soft, nontender, non-distended  MS: 2+ pitting edema bilaterally in LEs Neuro:  Nonfocal  Psych: Normal affect   Labs    High Sensitivity Troponin:   Recent Labs  Lab 05/20/20 1509 05/20/20 1727  TROPONINIHS 70* 72*      Chemistry Recent Labs  Lab 05/18/20 1057 05/21/20 0611 05/22/20 0517  NA 139 135 136  K 4.9 3.9 4.0  CL 105 102 100  CO2 26 22 24   GLUCOSE 185* 153* 203*  BUN 55* 71* 78*  CREATININE 3.25* 3.79* 3.94*  CALCIUM 8.5* 7.9* 7.9*  GFRNONAA 20* 17* 16*  ANIONGAP 8 11 12      Hematology Recent Labs  Lab 05/18/20 1057  WBC 8.2  RBC 2.55*  HGB 7.5*  HCT 24.3*  MCV 95.3  MCH 29.4  MCHC 30.9  RDW 16.6*  PLT 160  BNPNo results for input(s): BNP, PROBNP in the last 168 hours.   DDimer No results for input(s): DDIMER in the last 168 hours.   Radiology    No results found.  Cardiac Studies   Echo 03/22/2020 1. Left ventricular ejection fraction, by estimation, is 30-35%. The left  ventricle has moderately decreased function. The left ventricle  demonstrates regional wall motion abnormalities, basal to mid inferior and  inferoseptal akinesis, basal to mid  inferolateral severe hypokinesis. The left ventricular internal cavity  size was mildly dilated. There is mild left ventricular hypertrophy. Left  ventricular diastolic parameters are consistent with Grade II diastolic  dysfunction (pseudonormalization).  2. Right ventricular systolic function is moderately reduced. The right  ventricular size is normal. There is mildly elevated pulmonary artery   systolic pressure. The estimated right ventricular systolic pressure is  24.2 mmHg.  3. Left atrial size was moderately dilated.  4. The mitral valve is degenerative. Moderate to severe mitral valve  regurgitation. No evidence of mitral stenosis.  5. The aortic valve is tricuspid. Aortic valve regurgitation is mild.  Moderate aortic valve stenosis. Aortic valve area, by VTI measures 1.08  cm. Aortic valve mean gradient measures 22.0 mmHg.  6. Aortic dilatation noted. There is mild dilatation of the ascending  aorta measuring 42 mm.  7. The inferior vena cava is dilated in size with >50% respiratory  variability, suggesting right atrial pressure of 8 mmHg.   Cardiac Cath 03/23/2020  Ost RCA to Prox RCA lesion is 99% stenosed.  Post intervention, there is a 0% residual stenosis.  A drug-eluting stent was successfully placed using a STENT   Patient Profile     64 y.o. male with hx of HFrEF (EF 30 to 35%, 02/2020), CAD s/p CABG for three-vessel CAD (2012), aortic stenosis, mitral regurgitation, left-sided carotid endarterectomy, PAD followed by VVS, hypertension, hyperlipidemia, tobacco use, DM2, obesity, psoriasis, CKD/ESRD with plan to insert access for hemodialysis today 10/29, and who is being seen today for the evaluation of acute exacerbation of heart failure.  Assessment & Plan    1. Acute on chronic combined systolic and diastolic HF NYHA III-IV symptoms - need IV access today to give IV diuretics. Recommend starting with lasix 120mg  IV BID. May need to increase depending on his response to that dose. - daily BMP and Mag. K>4, Mg>2.  2. Severe Aortic stenosis - diuresis as above  3. Anemia - Need blood work today when IV placed. - Recommend keeping the Hgb > 8.   4. CAD s/p CABG - cont plavix, rosuva and imdur  5. ESRD --Followed by nephrology - awaiting AVF placement  For questions or updates, please contact [facility] Please consult [email or web address] for  contact info under        Signed, Vickie Epley, MD  05/22/2020, 11:00 AM

## 2020-05-22 NOTE — Progress Notes (Signed)
Progress Note    [name] [name]  WUJ:[id number] DOB: February 13, 1956  DOA: 05/20/2020 PCP: [name], MD      Brief Narrative:    Medical records reviewed and are as summarized below:  [name] [name] is a 64 y.o. male       Assessment/Plan:   Principal Problem:   Acute on chronic combined systolic and diastolic CHF (congestive heart failure) (HCC) Active Problems:   DM (diabetes mellitus), type 2 with complications ([place])   CAD (coronary artery disease)   CKD (chronic kidney disease), stage IV (HCC)   Aortic stenosis   Anemia in chronic kidney disease   Acute on chronic combined systolic (congestive) and diastolic (congestive) heart failure (HCC)   Body mass index is 37.15 kg/m. (Morbid obesity)   Acute dissipation of chronic systolic and diastolic CHF, moderate aortic stenosis, moderate to severe mitral regurgitation: IV Lasix has been restarted.  Monitor BMP, urine output and daily weight.  Patient is being evaluated for possible TAVR. 2D echo in August 2021 showed EF estimated at 30 to 95%, grade 2 diastolic dysfunction, moderate to severe MR, moderate aortic stenosis  AKI on CKD stage IV: Continue IV Lasix.  Plan for placement of left AV fistula by vascular surgeon tomorrow.  Follow-up with nephrologist and vascular surgeon.  GI bleeding/Acute on chronic blood loss anemia: Hemoglobin dropped from 7.5-5.9.  Transfuse 2 units of packed red blood cells monitor H&H.  Risks and benefits of blood transfusion discussed with the patient and is agreeable to transfusion.  He has been seen by the gastroenterologist and there is no plan for endoscopic work-up because of high cardiovascular risk.  Check iron studies.  No significant findings except for hiatal hernia on recent EGD on 04/11/2020.  Colonoscopy in October 2016 showed few polyps that were resected.  Mild thrombocytopenia: Monitor platelet count.  Insulin-dependent diabetes mellitus with recent hypoglycemia:  Hold home insulin NPH.  Monitor glucose closely and use NovoLog as needed for hyperglycemia.  CAD s/p coronary stent on 03/23/2020, h/o CABG: He is on carvedilol, isosorbide mononitrate, Plavix and rosuvastatin  BPH: Continue finasteride and Flomax  OSA: CPAP at night and as needed for sleep.  Diet Order            Diet heart healthy/carb modified Room service appropriate? Yes; Fluid consistency: Thin  Diet effective now                    Consultants:  Cardiologist  Nephrologist  Vascular surgeon  Gastroenterologist  Procedures:  None    Medications:   . sodium chloride   Intravenous Once  . sodium chloride   Intravenous Once  . B-complex with vitamin C  1 tablet Oral Daily  . busPIRone  20 mg Oral BID  . calcitRIOL  0.25 mcg Oral Q M,W,F  . carvedilol  3.125 mg Oral BID WC  . clopidogrel  75 mg Oral Daily  . feeding supplement  237 mL Oral Q24H  . finasteride  5 mg Oral Daily  . furosemide  80 mg Intravenous BID  . insulin aspart  0-5 Units Subcutaneous QHS  . insulin aspart  0-9 Units Subcutaneous TID WC  . isosorbide mononitrate  30 mg Oral Daily  . rosuvastatin  10 mg Oral Daily  . sertraline  100 mg Oral Daily  . sodium chloride flush  3 mL Intravenous Q12H  . tamsulosin  0.4 mg Oral Daily  . torsemide  60 mg Oral  BID   Continuous Infusions: . sodium chloride       Anti-infectives (From admission, onward)   Start     Dose/Rate Route Frequency Ordered Stop   05/20/20 0630  clindamycin (CLEOCIN) IVPB 300 mg  Status:  Discontinued        300 mg 100 mL/hr over 30 Minutes Intravenous On call to O.R. 05/20/20 0621 05/20/20 1500             Family Communication/Anticipated D/C date and plan/Code Status   DVT prophylaxis: SCDs Start: 05/20/20 0946     Code Status: Full Code  Family Communication: None Disposition Plan:    Status is: Inpatient  Remains inpatient appropriate because:Ongoing diagnostic testing needed not appropriate  for outpatient work up and Inpatient level of care appropriate due to severity of illness   Dispo: The patient is from: Home              Anticipated d/c is to: Home              Anticipated d/c date is: 3 days              Patient currently is not medically stable to d/c.           Subjective:   C/o black solid stools.  He still has swelling in his legs.  No shortness of breath or chest pain.  He had initially refused to have peripheral IV placed but he has now agreed to have peripheral IV for IV Lasix.  Objective:    Vitals:   05/21/20 2021 05/22/20 0500 05/22/20 0735 05/22/20 1118  BP: 140/78 126/72 133/76 128/71  Pulse: 82 100 88 89  Resp: 17 16 17 20   Temp: 97.7 F (36.5 C) 97.9 F (36.6 C) 98.1 F (36.7 C) 98.3 F (36.8 C)  TempSrc: Oral Oral Oral Oral  SpO2: 100% 100% 100% 99%  Weight:  104.4 kg    Height:       No data found.   Intake/Output Summary (Last 24 hours) at 05/22/2020 1329 Last data filed at 05/22/2020 1040 Gross per 24 hour  Intake 1000 ml  Output 1200 ml  Net -200 ml   Filed Weights   05/20/20 1458 05/21/20 0503 05/22/20 0500  Weight: 112 kg 104.8 kg 104.4 kg    Exam:  GEN: NAD SKIN: No rash EYES: EOMI ENT: MMM CV: RRR, loud systolic murmur in the aortic area. PULM: CTA B ABD: soft, obese, NT, +BS CNS: AAO x 3, non focal EXT: Bilateral leg edema, no tenderness    Data Reviewed:   I have personally reviewed following labs and imaging studies:  Labs: Labs show the following:   Basic Metabolic Panel: Recent Labs  Lab 05/18/20 1057 05/18/20 1057 05/21/20 0611 05/22/20 0517  NA 139  --  135 136  K 4.9   < > 3.9 4.0  CL 105  --  102 100  CO2 26  --  22 24  GLUCOSE 185*  --  153* 203*  BUN 55*  --  71* 78*  CREATININE 3.25*  --  3.79* 3.94*  CALCIUM 8.5*  --  7.9* 7.9*   < > = values in this interval not displayed.   GFR Estimated Creatinine Clearance: 20.9 mL/min (A) (by C-G formula based on SCr of 3.94 mg/dL  (H)). Liver Function Tests: No results for input(s): AST, ALT, ALKPHOS, BILITOT, PROT, ALBUMIN in the last 168 hours. No results for input(s): LIPASE, AMYLASE in the last  168 hours. No results for input(s): AMMONIA in the last 168 hours. Coagulation profile Recent Labs  Lab 05/18/20 1057  INR 1.0    CBC: Recent Labs  Lab 05/18/20 1057 05/22/20 1119  WBC 8.2 6.2  NEUTROABS 5.7 4.2  HGB 7.5* 5.9*  HCT 24.3* 18.9*  MCV 95.3 94.0  PLT 160 121*   Cardiac Enzymes: No results for input(s): CKTOTAL, CKMB, CKMBINDEX, TROPONINI in the last 168 hours. BNP (last 3 results) No results for input(s): PROBNP in the last 8760 hours. CBG: Recent Labs  Lab 05/21/20 1139 05/21/20 1638 05/21/20 2108 05/22/20 0736 05/22/20 1130  GLUCAP 127* 214* 220* 159* 174*   D-Dimer: No results for input(s): DDIMER in the last 72 hours. Hgb A1c: No results for input(s): HGBA1C in the last 72 hours. Lipid Profile: No results for input(s): CHOL, HDL, LDLCALC, TRIG, CHOLHDL, LDLDIRECT in the last 72 hours. Thyroid function studies: No results for input(s): TSH, T4TOTAL, T3FREE, THYROIDAB in the last 72 hours.  Invalid input(s): FREET3 Anemia work up: No results for input(s): VITAMINB12, FOLATE, FERRITIN, TIBC, IRON, RETICCTPCT in the last 72 hours. Sepsis Labs: Recent Labs  Lab 05/18/20 1057 05/22/20 1119  WBC 8.2 6.2    Microbiology Recent Results (from the past 240 hour(s))  SARS CORONAVIRUS 2 (TAT 6-24 HRS) Nasopharyngeal Nasopharyngeal Swab     Status: None   Collection Time: 05/18/20 11:54 AM   Specimen: Nasopharyngeal Swab  Result Value Ref Range Status   SARS Coronavirus 2 NEGATIVE NEGATIVE Final    Comment: (NOTE) SARS-CoV-2 target nucleic acids are NOT DETECTED.  The SARS-CoV-2 RNA is generally detectable in upper and lower respiratory specimens during the acute phase of infection. Negative results do not preclude SARS-CoV-2 infection, do not rule out co-infections with  other pathogens, and should not be used as the sole basis for treatment or other patient management decisions. Negative results must be combined with clinical observations, patient history, and epidemiological information. The expected result is Negative.  Fact Sheet for Patients: SugarRoll.be  Fact Sheet for Healthcare Providers: https://www.woods-mathews.com/  This test is not yet approved or cleared by the [place] FDA and  has been authorized for detection and/or diagnosis of SARS-CoV-2 by FDA under an Emergency Use Authorization (EUA). This EUA will remain  in effect (meaning this test can be used) for the duration of the COVID-19 declaration under Se ction 564(b)(1) of the Act, 21 U.S.C. section 360bbb-3(b)(1), unless the authorization is terminated or revoked sooner.  Performed at [facility], [place] [address], [place], [place] [address]     Procedures and diagnostic studies:  No results found.             LOS: 2 days   Providencia Hottenstein  [facility]   Pager on [email or web address]. If 7PM-7AM, please contact night-coverage at [email or web address]     05/22/2020, 1:29 PM

## 2020-05-22 NOTE — Progress Notes (Signed)
[facility]  ROUNDING NOTE   Subjective:   The patient reports that he is doing "ok" today.  He reports that his breathing is "alittle tight today" Admits to edema. He feels like his diet is too strict.   Objective:  Vital signs in last 24 hours:  Temp:  [97.7 F (36.5 C)-98.1 F (36.7 C)] 98.1 F (36.7 C) (10/31 0735) Pulse Rate:  [82-100] 88 (10/31 0735) Resp:  [16-20] 17 (10/31 0735) BP: (101-140)/(66-82) 133/76 (10/31 0735) SpO2:  [96 %-100 %] 100 % (10/31 0735) Weight:  [104.4 kg] 104.4 kg (10/31 0500)  Weight change: -7.639 kg Filed Weights   05/20/20 1458 05/21/20 0503 05/22/20 0500  Weight: 112 kg 104.8 kg 104.4 kg    Intake/Output: I/O last 3 completed shifts: In: 360 [P.O.:360] Out: 600 [Urine:600]   Intake/Output this shift:  Total I/O In: 460 [P.O.:460] Out: 600 [Urine:600]  Physical Exam: General: NAD,   Head: Normocephalic, atraumatic. Moist oral mucosal membranes  Eyes: Anicteric, PERRL  Neck: Supple, trachea midline  Lungs:  Clear to auscultation, using cpap   Heart: Regular rate and rhythm  Abdomen:  Soft, nontender,   Extremities:  2+ peripheral edema.  Neurologic: Nonfocal, moving all four extremities  Skin: No lesions       Basic Metabolic Panel: Recent Labs  Lab 05/18/20 1057 05/21/20 0611 05/22/20 0517  NA 139 135 136  K 4.9 3.9 4.0  CL 105 102 100  CO2 26 22 24   GLUCOSE 185* 153* 203*  BUN 55* 71* 78*  CREATININE 3.25* 3.79* 3.94*  CALCIUM 8.5* 7.9* 7.9*    Liver Function Tests: No results for input(s): AST, ALT, ALKPHOS, BILITOT, PROT, ALBUMIN in the last 168 hours. No results for input(s): LIPASE, AMYLASE in the last 168 hours. No results for input(s): AMMONIA in the last 168 hours.  CBC: Recent Labs  Lab 05/18/20 1057  WBC 8.2  NEUTROABS 5.7  HGB 7.5*  HCT 24.3*  MCV 95.3  PLT 160    Cardiac Enzymes: No results for input(s): CKTOTAL, CKMB, CKMBINDEX, TROPONINI in the last 168  hours.  BNP: Invalid input(s): POCBNP  CBG: Recent Labs  Lab 05/21/20 0754 05/21/20 1139 05/21/20 1638 05/21/20 2108 05/22/20 0736  GLUCAP 158* 127* 214* 220* 159*    Microbiology: Results for orders placed or performed during the hospital encounter of 05/18/20  SARS CORONAVIRUS 2 (TAT 6-24 HRS) Nasopharyngeal Nasopharyngeal Swab     Status: None   Collection Time: 05/18/20 11:54 AM   Specimen: Nasopharyngeal Swab  Result Value Ref Range Status   SARS Coronavirus 2 NEGATIVE NEGATIVE Final    Comment: (NOTE) SARS-CoV-2 target nucleic acids are NOT DETECTED.  The SARS-CoV-2 RNA is generally detectable in upper and lower respiratory specimens during the acute phase of infection. Negative results do not preclude SARS-CoV-2 infection, do not rule out co-infections with other pathogens, and should not be used as the sole basis for treatment or other patient management decisions. Negative results must be combined with clinical observations, patient history, and epidemiological information. The expected result is Negative.  Fact Sheet for Patients: SugarRoll.be  Fact Sheet for Healthcare Providers: https://www.woods-mathews.com/  This test is not yet approved or cleared by the [place] FDA and  has been authorized for detection and/or diagnosis of SARS-CoV-2 by FDA under an Emergency Use Authorization (EUA). This EUA will remain  in effect (meaning this test can be used) for the duration of the COVID-19 declaration under Se ction 564(b)(1) of the Act,  21 U.S.C. section 360bbb-3(b)(1), unless the authorization is terminated or revoked sooner.  Performed at [facility], [place] [address] [place], [place] [address]    *Note: Due to a large number of results and/or encounters for the requested time period, some results have not been displayed. A complete set of results can be found in Results Review.    Coagulation  Studies: No results for input(s): LABPROT, INR in the last 72 hours.  Urinalysis: No results for input(s): COLORURINE, LABSPEC, PHURINE, GLUCOSEU, HGBUR, BILIRUBINUR, KETONESUR, PROTEINUR, UROBILINOGEN, NITRITE, LEUKOCYTESUR in the last 72 hours.  Invalid input(s): APPERANCEUR    Imaging: No results found.   Medications:    sodium chloride      sodium chloride   Intravenous Once   sodium chloride   Intravenous Once   B-complex with vitamin C  1 tablet Oral Daily   busPIRone  20 mg Oral BID   calcitRIOL  0.25 mcg Oral Q M,W,F   carvedilol  3.125 mg Oral BID WC   clopidogrel  75 mg Oral Daily   feeding supplement  237 mL Oral Q24H   finasteride  5 mg Oral Daily   insulin aspart  0-5 Units Subcutaneous QHS   insulin aspart  0-9 Units Subcutaneous TID WC   isosorbide mononitrate  30 mg Oral Daily   rosuvastatin  10 mg Oral Daily   sertraline  100 mg Oral Daily   sodium chloride flush  3 mL Intravenous Q12H   tamsulosin  0.4 mg Oral Daily   torsemide  60 mg Oral BID   sodium chloride, acetaminophen, albuterol, cyclobenzaprine, fentaNYL, midazolam, nitroGLYCERIN, ondansetron (ZOFRAN) IV, polyvinyl alcohol, sodium chloride flush  Assessment/ Plan:  Mr. ADIB WAHBA is a 64 y.o.  male male with a history of PVD, Hypertension, Depression, severe aortic stenosis, obesity, DM type II, COPD, CAD with stent placement,  anemia and CKD stage IV who is admitted for Acute on chronic combined systolic and diastolic CHF.    1. Chronic Kidney disease stage IV - patients baseline creatinine is approximately 3.4 - planning on pursing dialysis in the future.   - continue to monitor kidney function with diuresis. Creatinine 3.94.   - patient has been refusing IV lasix, spoke with Dr. [name] and he is now agreeable to IM lasix. He later agreed to IV lasix and IV has been placed.    2. Anemia of CKD , Hgb was 9.4 ( 9/10)  , 7.9 ( 10/25), 7.5 ( 10/27)  - hgb 7.5  - has also been seen by GI  this hospitalization.     3. Hypertension  - currently on carvedilol , Imdur   4. Secondary hyperparathyroidism  - continue calcitriol    LOS: 2 Norman Bier P Janique Hoefer 10/31/20219:35 AM

## 2020-05-22 NOTE — Progress Notes (Signed)
Mobility Specialist - Progress Note   05/22/20 1400  Mobility  Activity Ambulated in hall  Level of Assistance Standby assist, set-up cues, supervision of patient - no hands on  Assistive Device Cane  Distance Ambulated (ft) 120 ft  Mobility Response Tolerated well  Mobility performed by Mobility specialist  $Mobility charge 1 Mobility    Pre-mobility: 94 HR, 98% SpO2 During mobility: 105 HR, 100% SpO2 Post-mobility: 102 HR, 99% SpO2   Pt was sitting EOB upon arrival utilizing room air. Pt agreed to session. Pt denied any pain, nausea, or fatigue. Pt was able to ambulate 120' in hallway with cane and close supervision. CGA utilized for safety. Noted that pt drags cane behind him as he ambulates, VC were used to correct this issue. Pt takes several stops during ambulation and is limited by fatigue. Pt c/o weakness in LE. Pt c/o SOB, however O2 sats maintained high 90s throughout session. Overall, pt tolerated session well. Pt was left EOB with all needs in reach.    Kathee Delton Mobility Specialist 05/22/20, 2:22 PM

## 2020-05-23 ENCOUNTER — Other Ambulatory Visit (INDEPENDENT_AMBULATORY_CARE_PROVIDER_SITE_OTHER): Payer: Self-pay | Admitting: Vascular Surgery

## 2020-05-23 ENCOUNTER — Other Ambulatory Visit: Payer: Self-pay | Admitting: *Deleted

## 2020-05-23 DIAGNOSIS — I350 Nonrheumatic aortic (valve) stenosis: Secondary | ICD-10-CM | POA: Diagnosis not present

## 2020-05-23 DIAGNOSIS — D631 Anemia in chronic kidney disease: Secondary | ICD-10-CM | POA: Diagnosis not present

## 2020-05-23 DIAGNOSIS — I5043 Acute on chronic combined systolic (congestive) and diastolic (congestive) heart failure: Secondary | ICD-10-CM | POA: Diagnosis not present

## 2020-05-23 DIAGNOSIS — N184 Chronic kidney disease, stage 4 (severe): Secondary | ICD-10-CM | POA: Diagnosis not present

## 2020-05-23 LAB — BPAM RBC
Blood Product Expiration Date: 202111102359
Blood Product Expiration Date: 202111192359
ISSUE DATE / TIME: 202110311504
ISSUE DATE / TIME: 202110311812
Unit Type and Rh: 600
Unit Type and Rh: 6200

## 2020-05-23 LAB — CBC WITH DIFFERENTIAL/PLATELET
Abs Immature Granulocytes: 0.03 10*3/uL (ref 0.00–0.07)
Basophils Absolute: 0 10*3/uL (ref 0.0–0.1)
Basophils Relative: 0 %
Eosinophils Absolute: 0.2 10*3/uL (ref 0.0–0.5)
Eosinophils Relative: 2 %
HCT: 25.8 % — ABNORMAL LOW (ref 39.0–52.0)
Hemoglobin: 8.5 g/dL — ABNORMAL LOW (ref 13.0–17.0)
Immature Granulocytes: 0 %
Lymphocytes Relative: 24 %
Lymphs Abs: 1.7 10*3/uL (ref 0.7–4.0)
MCH: 29.9 pg (ref 26.0–34.0)
MCHC: 32.9 g/dL (ref 30.0–36.0)
MCV: 90.8 fL (ref 80.0–100.0)
Monocytes Absolute: 0.6 10*3/uL (ref 0.1–1.0)
Monocytes Relative: 9 %
Neutro Abs: 4.3 10*3/uL (ref 1.7–7.7)
Neutrophils Relative %: 65 %
Platelets: 128 10*3/uL — ABNORMAL LOW (ref 150–400)
RBC: 2.84 MIL/uL — ABNORMAL LOW (ref 4.22–5.81)
RDW: 17.8 % — ABNORMAL HIGH (ref 11.5–15.5)
WBC: 6.8 10*3/uL (ref 4.0–10.5)
nRBC: 0 % (ref 0.0–0.2)

## 2020-05-23 LAB — TYPE AND SCREEN
ABO/RH(D): AB POS
Antibody Screen: NEGATIVE
Unit division: 0
Unit division: 0

## 2020-05-23 LAB — BASIC METABOLIC PANEL
Anion gap: 11 (ref 5–15)
BUN: 81 mg/dL — ABNORMAL HIGH (ref 8–23)
CO2: 26 mmol/L (ref 22–32)
Calcium: 7.7 mg/dL — ABNORMAL LOW (ref 8.9–10.3)
Chloride: 98 mmol/L (ref 98–111)
Creatinine, Ser: 3.78 mg/dL — ABNORMAL HIGH (ref 0.61–1.24)
GFR, Estimated: 17 mL/min — ABNORMAL LOW (ref >60)
Glucose, Bld: 140 mg/dL — ABNORMAL HIGH (ref 70–99)
Potassium: 3.6 mmol/L (ref 3.5–5.1)
Sodium: 135 mmol/L (ref 135–145)

## 2020-05-23 LAB — GLUCOSE, CAPILLARY
Glucose-Capillary: 154 mg/dL — ABNORMAL HIGH (ref 70–99)
Glucose-Capillary: 132 mg/dL — ABNORMAL HIGH (ref 70–99)
Glucose-Capillary: 152 mg/dL — ABNORMAL HIGH (ref 70–99)
Glucose-Capillary: 222 mg/dL — ABNORMAL HIGH (ref 70–99)

## 2020-05-23 MED ORDER — CHLORHEXIDINE GLUCONATE CLOTH 2 % EX PADS
6.0000 | MEDICATED_PAD | Freq: Once | CUTANEOUS | Status: AC
  Administered 2020-05-24: 6 via TOPICAL

## 2020-05-23 MED ORDER — CLINDAMYCIN PHOSPHATE 600 MG/50ML IV SOLN
600.0000 mg | INTRAVENOUS | Status: AC
  Administered 2020-05-24: 600 mg via INTRAVENOUS
  Filled 2020-05-23: qty 50

## 2020-05-23 MED ORDER — FUROSEMIDE 10 MG/ML IJ SOLN
120.0000 mg | Freq: Two times a day (BID) | INTRAVENOUS | Status: DC
  Administered 2020-05-23 – 2020-05-25 (×5): 120 mg via INTRAVENOUS
  Filled 2020-05-23 (×7): qty 12

## 2020-05-23 MED ORDER — CHLORHEXIDINE GLUCONATE CLOTH 2 % EX PADS
6.0000 | MEDICATED_PAD | Freq: Once | CUTANEOUS | Status: AC
  Administered 2020-05-23: 6 via TOPICAL

## 2020-05-23 MED ORDER — HYDROMORPHONE HCL 1 MG/ML IJ SOLN
1.0000 mg | Freq: Once | INTRAMUSCULAR | Status: AC | PRN
  Administered 2020-05-24: 1 mg via INTRAVENOUS
  Filled 2020-05-23: qty 1

## 2020-05-23 MED ORDER — ONDANSETRON HCL 4 MG/2ML IJ SOLN: 4.0000 mg | Freq: Four times a day (QID) | INTRAMUSCULAR | Status: DC | PRN

## 2020-05-23 MED ORDER — POTASSIUM CHLORIDE CRYS ER 20 MEQ PO TBCR
40.0000 meq | EXTENDED_RELEASE_TABLET | Freq: Once | ORAL | Status: AC
  Administered 2020-05-23: 40 meq via ORAL
  Filled 2020-05-23: qty 2

## 2020-05-23 NOTE — Progress Notes (Addendum)
   05/23/20 1901  Vitals  Temp 97.9 F (36.6 C)  Temp Source Oral  BP 129/73  MAP (mmHg) 88  BP Location Right Arm  BP Method Automatic  Patient Position (if appropriate) Sitting  Pulse Rate 89  Pulse Rate Source Monitor  Resp 17   Pt stated that he went to the bathroom an hour ago and fallen down. He stated that he hit his knee. He also said that he does it frequently. Dr. Mal Misty notified and charge nurse notified.   Call bell within reach. Pt made aware to use the call bell if he needs to pee. Pt also made aware that he needs to use the urinal. Pt stated that he understood.   Oncoming nurse Tish made aware.

## 2020-05-23 NOTE — Care Management Important Message (Signed)
Important Message  Patient Details  Name: [name] [name] MRN: [id number] Date of Birth: [birth year]   Medicare Important Message Given:  Yes     [name] 05/23/2020, 12:17 PM

## 2020-05-23 NOTE — Progress Notes (Signed)
Secure chat with Dr. Mal Misty regarding the pts Plavix. Per Dr. Mal Misty it is okay to give the patient the Plavix.

## 2020-05-23 NOTE — Progress Notes (Signed)
[facility]  ROUNDING NOTE   Subjective:   Patient sleeping in bed, with CPAP on, arousable to call. He reports shortness of breath getting better after receiving Lasix.  Objective:  Vital signs in last 24 hours:  Temp:  [97.3 F (36.3 C)-98.7 F (37.1 C)] 97.3 F (36.3 C) (11/01 1113) Pulse Rate:  [83-95] 87 (11/01 1113) Resp:  [17-20] 17 (11/01 1113) BP: (103-143)/(57-96) 141/83 (11/01 1113) SpO2:  [97 %-100 %] 98 % (11/01 1113) Weight:  [103.4 kg] 103.4 kg (11/01 0630)  Weight change: -0.98 kg Filed Weights   05/21/20 0503 05/22/20 0500 05/23/20 0630  Weight: 104.8 kg 104.4 kg 103.4 kg    Intake/Output: I/O last 3 completed shifts: In: 3298 [P.O.:1840; I.V.:10; Blood:1448] Out: 7124 [PYKDX:8338]   Intake/Output this shift:  No intake/output data recorded.  Physical Exam: General: Resting in bed, in no acute distress  Head:  Moist oral mucosal membranes,CPAP on  Eyes: Anicteric  Neck: Supple, trachea midline  Lungs:  Respirations even,unlabored,Lungs with fine crackles at the bases  Heart: S1S2,regular. +systolic murmur   Abdomen:  Soft, nontender, nontender  Extremities:  1+ peripheral edema.  Neurologic: Oriented x 3  Skin: No acute lesions or rashes       Basic Metabolic Panel: Recent Labs  Lab 05/18/20 1057 05/18/20 1057 05/21/20 0611 05/22/20 0517 05/23/20 0429  NA 139  --  135 136 135  K 4.9  --  3.9 4.0 3.6  CL 105  --  102 100 98  CO2 26  --  22 24 26   GLUCOSE 185*  --  153* 203* 140*  BUN 55*  --  71* 78* 81*  CREATININE 3.25*  --  3.79* 3.94* 3.78*  CALCIUM 8.5*   < > 7.9* 7.9* 7.7*   < > = values in this interval not displayed.    Liver Function Tests: No results for input(s): AST, ALT, ALKPHOS, BILITOT, PROT, ALBUMIN in the last 168 hours. No results for input(s): LIPASE, AMYLASE in the last 168 hours. No results for input(s): AMMONIA in the last 168 hours.  CBC: Recent Labs  Lab 05/18/20 1057 05/22/20 1119  05/23/20 0429  WBC 8.2 6.2 6.8  NEUTROABS 5.7 4.2 4.3  HGB 7.5* 5.9* 8.5*  HCT 24.3* 18.9* 25.8*  MCV 95.3 94.0 90.8  PLT 160 121* 128*    Cardiac Enzymes: No results for input(s): CKTOTAL, CKMB, CKMBINDEX, TROPONINI in the last 168 hours.  BNP: Invalid input(s): POCBNP  CBG: Recent Labs  Lab 05/22/20 1130 05/22/20 1633 05/22/20 2121 05/23/20 0734 05/23/20 1152  GLUCAP 174* 176* 202* 154* 132*    Microbiology: Results for orders placed or performed during the hospital encounter of 05/18/20  SARS CORONAVIRUS 2 (TAT 6-24 HRS) Nasopharyngeal Nasopharyngeal Swab     Status: None   Collection Time: 05/18/20 11:54 AM   Specimen: Nasopharyngeal Swab  Result Value Ref Range Status   SARS Coronavirus 2 NEGATIVE NEGATIVE Final    Comment: (NOTE) SARS-CoV-2 target nucleic acids are NOT DETECTED.  The SARS-CoV-2 RNA is generally detectable in upper and lower respiratory specimens during the acute phase of infection. Negative results do not preclude SARS-CoV-2 infection, do not rule out co-infections with other pathogens, and should not be used as the sole basis for treatment or other patient management decisions. Negative results must be combined with clinical observations, patient history, and epidemiological information. The expected result is Negative.  Fact Sheet for Patients: SugarRoll.be  Fact Sheet for Healthcare Providers: https://www.woods-mathews.com/  This  test is not yet approved or cleared by the [place] and  has been authorized for detection and/or diagnosis of SARS-CoV-2 by FDA under an Emergency Use Authorization (EUA). This EUA will remain  in effect (meaning this test can be used) for the duration of the COVID-19 declaration under Se ction 564(b)(1) of the Act, 21 U.S.C. section 360bbb-3(b)(1), unless the authorization is terminated or revoked sooner.  Performed at [facility], [place] [address] [place], [place] [address]    *Note: Due to a large number of results and/or encounters for the requested time period, some results have not been displayed. A complete set of results can be found in Results Review.    Coagulation Studies: No results for input(s): LABPROT, INR in the last 72 hours.  Urinalysis: No results for input(s): COLORURINE, LABSPEC, PHURINE, GLUCOSEU, HGBUR, BILIRUBINUR, KETONESUR, PROTEINUR, UROBILINOGEN, NITRITE, LEUKOCYTESUR in the last 72 hours.  Invalid input(s): APPERANCEUR    Imaging: No results found.   Medications:   . sodium chloride     . sodium chloride   Intravenous Once  . B-complex with vitamin C  1 tablet Oral Daily  . busPIRone  20 mg Oral BID  . calcitRIOL  0.25 mcg Oral Q M,W,F  . carvedilol  3.125 mg Oral BID WC  . clopidogrel  75 mg Oral Daily  . feeding supplement  237 mL Oral Q24H  . finasteride  5 mg Oral Daily  . furosemide  80 mg Intravenous BID  . insulin aspart  0-5 Units Subcutaneous QHS  . insulin aspart  0-9 Units Subcutaneous TID WC  . isosorbide mononitrate  30 mg Oral Daily  . potassium chloride  40 mEq Oral Once  . rosuvastatin  10 mg Oral Daily  . sertraline  100 mg Oral Daily  . sodium chloride flush  3 mL Intravenous Q12H  . tamsulosin  0.4 mg Oral Daily   sodium chloride, acetaminophen, albuterol, cyclobenzaprine, fentaNYL, midazolam, nitroGLYCERIN, ondansetron (ZOFRAN) IV, oxyCODONE, polyvinyl alcohol, sodium chloride flush  Assessment/ Plan:  [name] is a 64 y.o.  male with a history of PVD, Hypertension, Depression, severe aortic stenosis, obesity, DM type II, COPD, CAD with stent placement,  anemia and CKD stage IV who is admitted for Acute on chronic combined systolic and diastolic CHF.   1. Chronic Kidney disease stage IV - patients baseline creatinine is approximately 3.4  Creatinine 3.78 GFR 17 BUN 81 today Planning for AVF placement, anticipating future dialysis No acute indication  for dialysis today  2. Anemia of CKD Received PRBC transfusion during this admission Hemoglobin 8.5   Patient denies active GI bleed today Gastroenterology team involved  3. Hypertension Normotensive   Continue carvedilol and  Imdur,    4. Secondary hyperparathyroidism  Calcium 7.7 Phosphorus 5.0 on  03/24/2020 Will continue calcitriol    LOS: 3 Kemani Heidel 11/1/20213:12 PM

## 2020-05-23 NOTE — Progress Notes (Signed)
Progress Note    [name] [name]  ZOX:[id number] DOB: Jul 05, 1956  DOA: 05/20/2020 PCP: [name], MD      Brief Narrative:    Medical records reviewed and are as summarized below:  [name] [name] is a 64 y.o. male       Assessment/Plan:   Principal Problem:   Acute on chronic combined systolic and diastolic CHF (congestive heart failure) (HCC) Active Problems:   DM (diabetes mellitus), type 2 with complications ([place])   CAD (coronary artery disease)   CKD (chronic kidney disease), stage IV (HCC)   Aortic stenosis   Anemia in chronic kidney disease   Acute on chronic combined systolic (congestive) and diastolic (congestive) heart failure (HCC)   Body mass index is 36.8 kg/m. (Morbid obesity)   Acute dissipation of chronic systolic and diastolic CHF, moderate aortic stenosis, moderate to severe mitral regurgitation: Continue IV Lasix. Monitor BMP, urine output and daily weights. Patient is being evaluated for possible TAVR. 2D echo in August 2021 showed EF estimated at 30 to 81%, grade 2 diastolic dysfunction, moderate to severe MR, moderate aortic stenosis  AKI on CKD stage IV: Continue IV Lasix. Case discussed with vascular surgeon, Dr. Lucky Cowboy. He said there was no plan for AV fistula placement today. He will reevaluate the patient and determine whether patient can have AV fistula placed sometime this week. Nephrologist seem to think that patient does not need AV fistula at this time since he there is no indication for hemodialysis at this time.  GI bleeding/Acute on chronic blood loss anemia, iron deficiency anemia: H&H improved s/p transfusion with 2 units of packed red blood cells.  He has been seen by the gastroenterologist and there is no plan for endoscopic work-up because of high cardiovascular risk.   No significant findings except for hiatal hernia on recent EGD on 04/11/2020.  Colonoscopy in October 2016 showed few polyps that were resected.  Mild  thrombocytopenia: Monitor platelet count.  Insulin-dependent diabetes mellitus with recent hypoglycemia: Hold home insulin NPH.  Monitor glucose closely and use NovoLog as needed for hyperglycemia.  CAD s/p coronary stent on 03/23/2020, h/o CABG: He is on carvedilol, isosorbide mononitrate, Plavix and rosuvastatin  BPH: Continue finasteride and Flomax  OSA: CPAP at night and as needed for sleep.   Diet Order            Diet renal/carb modified with fluid restriction Diet-HS Snack? Nothing; Fluid restriction: 1200 mL Fluid; Room service appropriate? Yes; Fluid consistency: Thin  Diet effective now                    Consultants:  Cardiologist  Nephrologist  Vascular surgeon  Gastroenterologist  Procedures:  None    Medications:   . sodium chloride   Intravenous Once  . B-complex with vitamin C  1 tablet Oral Daily  . busPIRone  20 mg Oral BID  . calcitRIOL  0.25 mcg Oral Q M,W,F  . carvedilol  3.125 mg Oral BID WC  . clopidogrel  75 mg Oral Daily  . feeding supplement  237 mL Oral Q24H  . finasteride  5 mg Oral Daily  . furosemide  80 mg Intravenous BID  . insulin aspart  0-5 Units Subcutaneous QHS  . insulin aspart  0-9 Units Subcutaneous TID WC  . isosorbide mononitrate  30 mg Oral Daily  . potassium chloride  40 mEq Oral Once  . rosuvastatin  10 mg Oral Daily  . sertraline  100 mg Oral Daily  . sodium chloride flush  3 mL Intravenous Q12H  . tamsulosin  0.4 mg Oral Daily   Continuous Infusions: . sodium chloride       Anti-infectives (From admission, onward)   Start     Dose/Rate Route Frequency Ordered Stop   05/20/20 0630  clindamycin (CLEOCIN) IVPB 300 mg  Status:  Discontinued        300 mg 100 mL/hr over 30 Minutes Intravenous On call to O.R. 05/20/20 0621 05/20/20 1500             Family Communication/Anticipated D/C date and plan/Code Status   DVT prophylaxis: SCDs Start: 05/20/20 0946     Code Status: Full Code  Family  Communication: None Disposition Plan:    Status is: Inpatient  Remains inpatient appropriate because:Ongoing diagnostic testing needed not appropriate for outpatient work up and Inpatient level of care appropriate due to severity of illness   Dispo: The patient is from: Home              Anticipated d/c is to: Home              Anticipated d/c date is: 2 days              Patient currently is not medically stable to d/c.           Subjective:   No shortness of breath or chest pain. Patient was under the impression that he is going to have AV fistula placed today.  Objective:    Vitals:   05/23/20 0404 05/23/20 0630 05/23/20 0732 05/23/20 1113  BP: (!) 108/96  (!) 143/74 (!) 141/83  Pulse: 88  83 87  Resp: 18  17 17   Temp: 97.6 F (36.4 C)  97.8 F (36.6 C) (!) 97.3 F (36.3 C)  TempSrc: Oral   Oral  SpO2: 100%  97% 98%  Weight:  103.4 kg    Height:       No data found.   Intake/Output Summary (Last 24 hours) at 05/23/2020 1429 Last data filed at 05/22/2020 2133 Gross per 24 hour  Intake 2298 ml  Output 1175 ml  Net 1123 ml   Filed Weights   05/21/20 0503 05/22/20 0500 05/23/20 0630  Weight: 104.8 kg 104.4 kg 103.4 kg    Exam:   GEN: NAD SKIN: No rash EYES: EOMI ENT: MMM CV: RRR, systolic murmur in the aortic area PULM: CTA B ABD: soft, obese, NT, +BS CNS: AAO x 3, non focal EXT: Bilateral leg edema (2+), no tenderness.       Data Reviewed:   I have personally reviewed following labs and imaging studies:  Labs: Labs show the following:   Basic Metabolic Panel: Recent Labs  Lab 05/18/20 1057 05/18/20 1057 05/21/20 0611 05/21/20 0611 05/22/20 0517 05/23/20 0429  NA 139  --  135  --  136 135  K 4.9   < > 3.9   < > 4.0 3.6  CL 105  --  102  --  100 98  CO2 26  --  22  --  24 26  GLUCOSE 185*  --  153*  --  203* 140*  BUN 55*  --  71*  --  78* 81*  CREATININE 3.25*  --  3.79*  --  3.94* 3.78*  CALCIUM 8.5*  --  7.9*  --  7.9*  7.7*   < > = values in this interval not displayed.   GFR  Estimated Creatinine Clearance: 21.6 mL/min (A) (by C-G formula based on SCr of 3.78 mg/dL (H)). Liver Function Tests: No results for input(s): AST, ALT, ALKPHOS, BILITOT, PROT, ALBUMIN in the last 168 hours. No results for input(s): LIPASE, AMYLASE in the last 168 hours. No results for input(s): AMMONIA in the last 168 hours. Coagulation profile Recent Labs  Lab 05/18/20 1057  INR 1.0    CBC: Recent Labs  Lab 05/18/20 1057 05/22/20 1119 05/23/20 0429  WBC 8.2 6.2 6.8  NEUTROABS 5.7 4.2 4.3  HGB 7.5* 5.9* 8.5*  HCT 24.3* 18.9* 25.8*  MCV 95.3 94.0 90.8  PLT 160 121* 128*   Cardiac Enzymes: No results for input(s): CKTOTAL, CKMB, CKMBINDEX, TROPONINI in the last 168 hours. BNP (last 3 results) No results for input(s): PROBNP in the last 8760 hours. CBG: Recent Labs  Lab 05/22/20 1130 05/22/20 1633 05/22/20 2121 05/23/20 0734 05/23/20 1152  GLUCAP 174* 176* 202* 154* 132*   D-Dimer: No results for input(s): DDIMER in the last 72 hours. Hgb A1c: No results for input(s): HGBA1C in the last 72 hours. Lipid Profile: No results for input(s): CHOL, HDL, LDLCALC, TRIG, CHOLHDL, LDLDIRECT in the last 72 hours. Thyroid function studies: No results for input(s): TSH, T4TOTAL, T3FREE, THYROIDAB in the last 72 hours.  Invalid input(s): FREET3 Anemia work up: Recent Labs    05/22/20 1352  FERRITIN 31  TIBC 441  IRON 43*   Sepsis Labs: Recent Labs  Lab 05/18/20 1057 05/22/20 1119 05/23/20 0429  WBC 8.2 6.2 6.8    Microbiology Recent Results (from the past 240 hour(s))  SARS CORONAVIRUS 2 (TAT 6-24 HRS) Nasopharyngeal Nasopharyngeal Swab     Status: None   Collection Time: 05/18/20 11:54 AM   Specimen: Nasopharyngeal Swab  Result Value Ref Range Status   SARS Coronavirus 2 NEGATIVE NEGATIVE Final    Comment: (NOTE) SARS-CoV-2 target nucleic acids are NOT DETECTED.  The SARS-CoV-2 RNA is generally  detectable in upper and lower respiratory specimens during the acute phase of infection. Negative results do not preclude SARS-CoV-2 infection, do not rule out co-infections with other pathogens, and should not be used as the sole basis for treatment or other patient management decisions. Negative results must be combined with clinical observations, patient history, and epidemiological information. The expected result is Negative.  Fact Sheet for Patients: SugarRoll.be  Fact Sheet for Healthcare Providers: https://www.woods-mathews.com/  This test is not yet approved or cleared by the [place] FDA and  has been authorized for detection and/or diagnosis of SARS-CoV-2 by FDA under an Emergency Use Authorization (EUA). This EUA will remain  in effect (meaning this test can be used) for the duration of the COVID-19 declaration under Se ction 564(b)(1) of the Act, 21 U.S.C. section 360bbb-3(b)(1), unless the authorization is terminated or revoked sooner.  Performed at [facility], [place] [address] [place], [place] [address]     Procedures and diagnostic studies:  No results found.             LOS: 3 days   Cranford Blessinger  [facility]   Pager on [email or web address]. If 7PM-7AM, please contact night-coverage at [email or web address]     05/23/2020, 2:29 PM

## 2020-05-23 NOTE — Plan of Care (Signed)

## 2020-05-23 NOTE — Progress Notes (Signed)
Progress Note  Patient Name: [name] [name] Date of Encounter: 05/23/2020  Primary Cardiologist: [name], MD   Subjective   He denies chest pain and reports breathing at baseline.  He refused IV Lasix over the weekend 2/2 pain with IV lasix administration; however, with oral diuresis, he became volume overloaded and SOB with resumption of IV Lasix that he reports he is now tolerating with improved breathing. He is unable to lay flat, which she attributes to tailbone pain, rather than orthopnea.  He reports he feels shaky; however, this is in the setting of being n.p.o. status.  He states he is NPO as he is awaiting AV fistula placement, which he suspects will take place today (cannot locate this procedure as scheduled on review of EMR).  He received 1.5U blood yesterday and reportedly had melena over the weekend.   Inpatient Medications    Scheduled Meds: . sodium chloride   Intravenous Once  . B-complex with vitamin C  1 tablet Oral Daily  . busPIRone  20 mg Oral BID  . calcitRIOL  0.25 mcg Oral Q M,W,F  . carvedilol  3.125 mg Oral BID WC  . clopidogrel  75 mg Oral Daily  . feeding supplement  237 mL Oral Q24H  . finasteride  5 mg Oral Daily  . furosemide  80 mg Intravenous BID  . insulin aspart  0-5 Units Subcutaneous QHS  . insulin aspart  0-9 Units Subcutaneous TID WC  . isosorbide mononitrate  30 mg Oral Daily  . rosuvastatin  10 mg Oral Daily  . sertraline  100 mg Oral Daily  . sodium chloride flush  3 mL Intravenous Q12H  . tamsulosin  0.4 mg Oral Daily   Continuous Infusions: . sodium chloride     PRN Meds: sodium chloride, acetaminophen, albuterol, cyclobenzaprine, fentaNYL, midazolam, nitroGLYCERIN, ondansetron (ZOFRAN) IV, oxyCODONE, polyvinyl alcohol, sodium chloride flush   Vital Signs    Vitals:   05/23/20 0404 05/23/20 0630 05/23/20 0732 05/23/20 1113  BP: (!) 108/96  (!) 143/74 (!) 141/83  Pulse: 88  83 87  Resp: 18  17 17   Temp: 97.6 F  (36.4 C)  97.8 F (36.6 C) (!) 97.3 F (36.3 C)  TempSrc: Oral   Oral  SpO2: 100%  97% 98%  Weight:  103.4 kg    Height:        Intake/Output Summary (Last 24 hours) at 05/23/2020 1252 Last data filed at 05/22/2020 2133 Gross per 24 hour  Intake 2298 ml  Output 1175 ml  Net 1123 ml   Last 3 Weights 05/23/2020 05/22/2020 05/21/2020  Weight (lbs) 228 lb 230 lb 2.6 oz 231 lb  Weight (kg) 103.42 kg 104.4 kg 104.781 kg  Some encounter information is confidential and restricted. Go to Review Flowsheets activity to see all data.      Telemetry    NSR, 80s-90s- Personally Reviewed  ECG    No new tracings- Personally Reviewed  Physical Exam   GEN: No acute distress.  Neck:  JVD difficult to assess due to body habitus Cardiac: RRR, 3/6 systolic murmur appreciated throughout the cardiac exam.  No rubs, or gallops.  Respiratory: Bilaterally reduced breath sounds, expiratory wheeze  GI: Soft, nontender, non-distended  MS:  Minimal bilateral nonpitting edema; No deformity. Neuro:  Nonfocal  Psych: Normal affect   Labs    High Sensitivity Troponin:   Recent Labs  Lab 05/20/20 1509 05/20/20 1727  TROPONINIHS 70* 72*      Chemistry Recent  Labs  Lab 05/21/20 0611 05/22/20 0517 05/23/20 0429  NA 135 136 135  K 3.9 4.0 3.6  CL 102 100 98  CO2 22 24 26   GLUCOSE 153* 203* 140*  BUN 71* 78* 81*  CREATININE 3.79* 3.94* 3.78*  CALCIUM 7.9* 7.9* 7.7*  GFRNONAA 17* 16* 17*  ANIONGAP 11 12 11      Hematology Recent Labs  Lab 05/18/20 1057 05/22/20 1119 05/23/20 0429  WBC 8.2 6.2 6.8  RBC 2.55* 2.01* 2.84*  HGB 7.5* 5.9* 8.5*  HCT 24.3* 18.9* 25.8*  MCV 95.3 94.0 90.8  MCH 29.4 29.4 29.9  MCHC 30.9 31.2 32.9  RDW 16.6* 17.0* 17.8*  PLT 160 121* 128*    BNPNo results for input(s): BNP, PROBNP in the last 168 hours.   DDimer No results for input(s): DDIMER in the last 168 hours.   Radiology    No results found.  Cardiac Studies   Echo 03/22/2020 1. Left  ventricular ejection fraction, by estimation, is 30-35%. The left  ventricle has moderately decreased function. The left ventricle  demonstrates regional wall motion abnormalities, basal to mid inferior and  inferoseptal akinesis, basal to mid  inferolateral severe hypokinesis. The left ventricular internal cavity  size was mildly dilated. There is mild left ventricular hypertrophy. Left  ventricular diastolic parameters are consistent with Grade II diastolic  dysfunction (pseudonormalization).  2. Right ventricular systolic function is moderately reduced. The right  ventricular size is normal. There is mildly elevated pulmonary artery  systolic pressure. The estimated right ventricular systolic pressure is  16.9 mmHg.  3. Left atrial size was moderately dilated.  4. The mitral valve is degenerative. Moderate to severe mitral valve  regurgitation. No evidence of mitral stenosis.  5. The aortic valve is tricuspid. Aortic valve regurgitation is mild.  Moderate aortic valve stenosis. Aortic valve area, by VTI measures 1.08  cm. Aortic valve mean gradient measures 22.0 mmHg.  6. Aortic dilatation noted. There is mild dilatation of the ascending  aorta measuring 42 mm.  7. The inferior vena cava is dilated in size with >50% respiratory  variability, suggesting right atrial pressure of 8 mmHg.   Cardiac Cath 03/23/2020  Ost RCA to Prox RCA lesion is 99% stenosed.  Post intervention, there is a 0% residual stenosis.  A drug-eluting stent was successfully placed using a STENT   Patient Profile     64 y.o. male with hx of HFrEF (EF 30 to 35%, 02/2020), CAD s/p CABG for three-vessel CAD (2012), aortic stenosis, mitral regurgitation, left-sided carotid endarterectomy, PAD followed by [facility], hypertension, hyperlipidemia, tobacco use, DM2, obesity, psoriasis, CKD/ESRD with plan to insert access for hemodialysis today 10/29, and who is being seen today for the evaluation of acute exacerbation  of heart failure.  Assessment & Plan    Acute on chronic HFrEF --Reports improvement of shortness of breath with restart of IV Lasix.  He is admitted s/p acute exacerbation of heart failure after receipt of Versed before scheduled AV fistula placement, worsened by receipt of the IVF and transfusion, as he is unable to tolerate large shifts in fluid. --Most recent 02/2020 echo as above with known severe aortic stenosis and mitral regurgitation, as well as moderately reduced LVSF at 30 to 35%, PAH, and elevated right heart pressures.   --Over the weekend, he refused IV Lasix and was transitioned to oral torsemide 60 mg twice daily with subsequent worsening volume status.  IV Lasix has been restarted since that time with improvement in volume status  and symptoms.  --Continue IV Lasix 80 mg twice daily. Strict I's/O's.  He received additional 1.5cc blood yesterday 05/22/2020.  As a result, daily net +1.5 L with 1.7L output same day. Daily weights. Wt 104.4kg  103.4kg.  --Daily BMET. Cr 3.94  3.78. Nephrology following. Replete electrolytes as needed with current potassium low. --Continue current medications, including carvedilol, hydralazine.  No ACE/ARB/Arni/MRA secondary to CKD.  Continue BiPAP.  Hypokalemia --Replete with goal 4.0.  We will start potassium supplementation. Daily BMET.   Severe aortic stenosis --Recent echo as above with aortic valve area 1.08cm and aortic valve mean gradient 22.0 mmHg.  Currently undergoing evaluation for TAVR by structural heart team with most recent CT showing that showed severe calcification of trileaflet aortic valve and suitable anatomy for TAVR.  See HPI as above.  Given his severe valvular disease, he is unable to tolerate large shifts in fluid.  Caution with administration of IV D50/dextrose at the same time as blood transfusions.  Severe anemia, acute blood loss anemia --Current hemoglobin 8.5 with hematocrit 25.8.   --Severe anemia likely contributing  to SOB.   --Currently being evaluated by GI for h/o possible GI bleed with report of melena and recent report of blood in his stool as outlined above in HPI.  At his last office visit, he was referred to GI.   --As above, he is unable to tolerate large shifts in fluid with recommendation for caution during any transfusion.   --Transfusion as needed to maintain hemoglobin above 8.0.   --Continue to monitor H&H with daily CBC.  CAD s/p CABG in 2012 s/p recent PCI/DES to the RCA 03/23/2020 --No reported angina.  ASA held given hemoglobin.  Continue Plavix 75 mg, given his recent stenting. Continue rosuvastatin 10 mg and Imdur 30 mg daily.  ESRD --Followed by nephrology.  S/p vein mapping by Dr. Delana Meyer to obtain vascular access.  Was scheduled for AV fistula placement today to facilitate TAVR procedure as above.  AV fistula placement has been deferred given his acute shortness of breath.  Nephrology consulted.  HTN --BP labile today. Monitor.  Continue current medications.  Suspect improvement of BP with diuresis.  HLD -Continue Crestor 10 mg.         New York Heart Association (NYHA) Functional Class NYHA Class IV    For questions or updates, please contact [facility] Please consult [email or web address] for contact info under        Signed, Arvil Chaco, PA-C  05/23/2020, 12:52 PM

## 2020-05-23 NOTE — Progress Notes (Signed)
° °  Heart Failure Nurse Navigator Note  HFrEF 30-35%, Mild LVH,, wall motion abnormalities. Grade II Diastolic Dysfunction.  RVSF mildly reduced. Moderate to severe MR. Mild AI. Moderate AS.   Co morbidities:  Coronary artery disease (CABG 3V/DES) Hypertension Hyperlipidemia Diabetes type 2 Obesity  CKD/ESRD   Medications:  Coreg 3.125 mg BID Plavix 75 mg daily Lasix 80 mg IV BID Imdur 30 mg daily Crestor 10 mg daily    Labs:  Sodium 135, potassium 3.6, BUN 81(78), creatinine 3.78 (3.94) hemoglobin 8.5, hematocrit 25.8  Weight 103.4 kg (104.4)  Intake 3298 ml Output 1775 ml   Assessment:  General- he is awake and alert, sitting on EOB.  HEENT- pupil equal, normocephalic.  Cardiac- heart tones are regular, systolic murmur best auscultated RUSB.  Abdomen- rounded, soft non tender  Musculoskeletal- 1+ pitting edema  Psych- is pleasant and appropriate  Neuro- speech is clear, moves all extremities without difficulty.   Initial Visit   Spoke with patient at the bedside.  He states he is familiar with the term "heart failure"  By teach back method, he was able to tell me when at home when he would contact his physician.  He also weighs himself daily and records.  He lives with son, daughter in law and grandson.    He does not drive.  He does read labels, does not add salt at the table or cook with it.  He does admit to using salt on water melon.  Given heart failure teaching booklet along with zone magnet, zones were discussed.  Will continue to follow.  Pricilla Riffle RN, CHFN

## 2020-05-23 NOTE — Patient Outreach (Signed)
[facility]) Care Management  05/23/2020  [name] [name] [facility] 03-Aug-1955 [id number]   Telephone Assessment-Unsuccessful-case closure.  RN unable to reach this pt after several outreach calls and no response to the outreach letter sent to this pt last month. Base upon the unsuccessful contacts will close this case from further outreach calls at this time and notify his provider of case closure.  Raina Mina, RN Care Management Coordinator [place] Office [phone]

## 2020-05-24 ENCOUNTER — Inpatient Hospital Stay: Payer: Medicare Other

## 2020-05-24 ENCOUNTER — Encounter: Payer: Self-pay | Admitting: Internal Medicine

## 2020-05-24 ENCOUNTER — Inpatient Hospital Stay: Payer: Medicare Other | Admitting: Anesthesiology

## 2020-05-24 ENCOUNTER — Encounter
Admission: RE | Disposition: A | Discharge status: Home / Self Care | Payer: Self-pay | Source: Home / Self Care | Attending: Internal Medicine

## 2020-05-24 DIAGNOSIS — N184 Chronic kidney disease, stage 4 (severe): Secondary | ICD-10-CM | POA: Diagnosis not present

## 2020-05-24 DIAGNOSIS — D631 Anemia in chronic kidney disease: Secondary | ICD-10-CM | POA: Diagnosis not present

## 2020-05-24 DIAGNOSIS — I350 Nonrheumatic aortic (valve) stenosis: Secondary | ICD-10-CM | POA: Diagnosis not present

## 2020-05-24 DIAGNOSIS — I5023 Acute on chronic systolic (congestive) heart failure: Secondary | ICD-10-CM | POA: Diagnosis not present

## 2020-05-24 DIAGNOSIS — I5043 Acute on chronic combined systolic (congestive) and diastolic (congestive) heart failure: Secondary | ICD-10-CM | POA: Diagnosis not present

## 2020-05-24 HISTORY — PX: AV FISTULA PLACEMENT: SHX1204

## 2020-05-24 LAB — CBC WITH DIFFERENTIAL/PLATELET
Abs Immature Granulocytes: 0.02 10*3/uL (ref 0.00–0.07)
Basophils Absolute: 0 10*3/uL (ref 0.0–0.1)
Basophils Relative: 0 %
Eosinophils Absolute: 0.2 10*3/uL (ref 0.0–0.5)
Eosinophils Relative: 3 %
HCT: 27.8 % — ABNORMAL LOW (ref 39.0–52.0)
Hemoglobin: 9 g/dL — ABNORMAL LOW (ref 13.0–17.0)
Immature Granulocytes: 0 %
Lymphocytes Relative: 22 %
Lymphs Abs: 1.5 10*3/uL (ref 0.7–4.0)
MCH: 29.4 pg (ref 26.0–34.0)
MCHC: 32.4 g/dL (ref 30.0–36.0)
MCV: 90.8 fL (ref 80.0–100.0)
Monocytes Absolute: 0.6 10*3/uL (ref 0.1–1.0)
Monocytes Relative: 9 %
Neutro Abs: 4.7 10*3/uL (ref 1.7–7.7)
Neutrophils Relative %: 66 %
Platelets: 147 10*3/uL — ABNORMAL LOW (ref 150–400)
RBC: 3.06 MIL/uL — ABNORMAL LOW (ref 4.22–5.81)
RDW: 17.3 % — ABNORMAL HIGH (ref 11.5–15.5)
WBC: 7.1 10*3/uL (ref 4.0–10.5)
nRBC: 0 % (ref 0.0–0.2)

## 2020-05-24 LAB — PROTIME-INR
INR: 1 (ref 0.8–1.2)
Prothrombin Time: 13.2 seconds (ref 11.4–15.2)

## 2020-05-24 LAB — APTT: aPTT: 35 seconds (ref 24–36)

## 2020-05-24 LAB — BASIC METABOLIC PANEL
Anion gap: 15 (ref 5–15)
BUN: 83 mg/dL — ABNORMAL HIGH (ref 8–23)
CO2: 24 mmol/L (ref 22–32)
Calcium: 8.2 mg/dL — ABNORMAL LOW (ref 8.9–10.3)
Chloride: 96 mmol/L — ABNORMAL LOW (ref 98–111)
Creatinine, Ser: 3.84 mg/dL — ABNORMAL HIGH (ref 0.61–1.24)
GFR, Estimated: 17 mL/min — ABNORMAL LOW (ref >60)
Glucose, Bld: 151 mg/dL — ABNORMAL HIGH (ref 70–99)
Potassium: 3.9 mmol/L (ref 3.5–5.1)
Sodium: 135 mmol/L (ref 135–145)

## 2020-05-24 LAB — GLUCOSE, CAPILLARY
Glucose-Capillary: 141 mg/dL — ABNORMAL HIGH (ref 70–99)
Glucose-Capillary: 139 mg/dL — ABNORMAL HIGH (ref 70–99)
Glucose-Capillary: 119 mg/dL — ABNORMAL HIGH (ref 70–99)
Glucose-Capillary: 124 mg/dL — ABNORMAL HIGH (ref 70–99)
Glucose-Capillary: 144 mg/dL — ABNORMAL HIGH (ref 70–99)

## 2020-05-24 LAB — TYPE AND SCREEN
ABO/RH(D): AB POS
Antibody Screen: NEGATIVE

## 2020-05-24 LAB — MAGNESIUM: Magnesium: 2.2 mg/dL (ref 1.7–2.4)

## 2020-05-24 SURGERY — ARTERIOVENOUS (AV) FISTULA CREATION
Anesthesia: Monitor Anesthesia Care | Site: Arm Lower

## 2020-05-24 MED ORDER — DEXMEDETOMIDINE (PRECEDEX) IN NS 20 MCG/5ML (4 MCG/ML) IV SYRINGE
PREFILLED_SYRINGE | INTRAVENOUS | Status: DC | PRN
  Administered 2020-05-24 (×3): 4 ug via INTRAVENOUS

## 2020-05-24 MED ORDER — ROPIVACAINE HCL 5 MG/ML IJ SOLN
INTRAMUSCULAR | Status: AC
  Filled 2020-05-24: qty 30

## 2020-05-24 MED ORDER — PHENYLEPHRINE HCL (PRESSORS) 10 MG/ML IV SOLN
INTRAVENOUS | Status: DC | PRN
  Administered 2020-05-24: 200 ug via INTRAVENOUS
  Administered 2020-05-24: 100 ug via INTRAVENOUS
  Administered 2020-05-24 (×3): 200 ug via INTRAVENOUS

## 2020-05-24 MED ORDER — HEPARIN SODIUM (PORCINE) 1000 UNIT/ML IJ SOLN
INTRAMUSCULAR | Status: DC | PRN
  Administered 2020-05-24: 3000 [IU] via INTRAVENOUS

## 2020-05-24 MED ORDER — MIDAZOLAM HCL 2 MG/2ML IJ SOLN: 0.5000 mg | Freq: Once | INTRAMUSCULAR | Status: AC

## 2020-05-24 MED ORDER — CLINDAMYCIN PHOSPHATE 600 MG/50ML IV SOLN
INTRAVENOUS | Status: AC
  Filled 2020-05-24: qty 50

## 2020-05-24 MED ORDER — ALUM & MAG HYDROXIDE-SIMETH 200-200-20 MG/5ML PO SUSP
30.0000 mL | Freq: Once | ORAL | Status: AC
  Administered 2020-05-24: 30 mL via ORAL
  Filled 2020-05-24: qty 30

## 2020-05-24 MED ORDER — SODIUM CHLORIDE 0.9 % IV SOLN
INTRAVENOUS | Status: DC | PRN
  Administered 2020-05-24: 150 mL via INTRAMUSCULAR

## 2020-05-24 MED ORDER — MIDAZOLAM HCL 2 MG/2ML IJ SOLN
INTRAMUSCULAR | Status: AC
  Administered 2020-05-24: 0.5 mg via INTRAVENOUS
  Filled 2020-05-24: qty 2

## 2020-05-24 MED ORDER — LIDOCAINE 2% (20 MG/ML) 5 ML SYRINGE
INTRAMUSCULAR | Status: DC | PRN
  Administered 2020-05-24: 25 mg via INTRAVENOUS

## 2020-05-24 MED ORDER — ROPIVACAINE HCL 5 MG/ML IJ SOLN
INTRAMUSCULAR | Status: DC | PRN
  Administered 2020-05-24: 30 mL via PERINEURAL

## 2020-05-24 MED ORDER — FENTANYL CITRATE (PF) 100 MCG/2ML IJ SOLN: 25.0000 ug | INTRAMUSCULAR | Status: DC | PRN

## 2020-05-24 MED ORDER — LIDOCAINE HCL (PF) 1 % IJ SOLN
INTRAMUSCULAR | Status: AC
  Filled 2020-05-24: qty 5

## 2020-05-24 MED ORDER — HEPARIN SODIUM (PORCINE) 1000 UNIT/ML IJ SOLN
INTRAMUSCULAR | Status: AC
  Filled 2020-05-24: qty 1

## 2020-05-24 MED ORDER — PROPOFOL 500 MG/50ML IV EMUL
INTRAVENOUS | Status: AC
  Filled 2020-05-24: qty 50

## 2020-05-24 MED ORDER — PROPOFOL 500 MG/50ML IV EMUL
INTRAVENOUS | Status: DC | PRN
  Administered 2020-05-24: 20 ug/kg/min via INTRAVENOUS

## 2020-05-24 MED ORDER — PHENYLEPHRINE HCL (PRESSORS) 10 MG/ML IV SOLN
INTRAVENOUS | Status: AC
  Filled 2020-05-24: qty 1

## 2020-05-24 MED ORDER — SODIUM CHLORIDE 0.9 % IV SOLN
INTRAVENOUS | Status: DC | PRN
  Administered 2020-05-24: 40 ug/min via INTRAVENOUS

## 2020-05-24 MED ORDER — LIDOCAINE HCL (PF) 1 % IJ SOLN
INTRAMUSCULAR | Status: DC | PRN
  Administered 2020-05-24: 3 mL

## 2020-05-24 MED ORDER — BUPIVACAINE HCL (PF) 0.5 % IJ SOLN
INTRAMUSCULAR | Status: AC
  Filled 2020-05-24: qty 10

## 2020-05-24 MED ORDER — ONDANSETRON HCL 4 MG/2ML IJ SOLN: 4.0000 mg | Freq: Once | INTRAMUSCULAR | Status: DC | PRN

## 2020-05-24 SURGICAL SUPPLY — 50 items
BAG DECANTER FOR FLEXI CONT (MISCELLANEOUS) ×3 IMPLANT
BLADE SURG SZ11 CARB STEEL (BLADE) ×3 IMPLANT
BOOT SUTURE AID YELLOW STND (SUTURE) ×3 IMPLANT
BRUSH SCRUB EZ  4% CHG (MISCELLANEOUS) ×2
BRUSH SCRUB EZ 4% CHG (MISCELLANEOUS) ×1 IMPLANT
CANISTER SUCT 1200ML W/VALVE (MISCELLANEOUS) ×3 IMPLANT
CHLORAPREP W/TINT 26 (MISCELLANEOUS) ×3 IMPLANT
CLIP SPRNG 6MM S-JAW DBL (CLIP) ×3
COVER WAND RF STERILE (DRAPES) ×3 IMPLANT
DERMABOND ADVANCED (GAUZE/BANDAGES/DRESSINGS) ×2
DERMABOND ADVANCED .7 DNX12 (GAUZE/BANDAGES/DRESSINGS) ×1 IMPLANT
ELECT CAUTERY BLADE 6.4 (BLADE) ×3 IMPLANT
ELECT REM PT RETURN 9FT ADLT (ELECTROSURGICAL) ×3
ELECTRODE REM PT RTRN 9FT ADLT (ELECTROSURGICAL) ×1 IMPLANT
GEL ULTRASOUND 20GR AQUASONIC (MISCELLANEOUS) IMPLANT
GLOVE BIO SURGEON STRL SZ7 (GLOVE) ×6 IMPLANT
GLOVE INDICATOR 7.5 STRL GRN (GLOVE) ×3 IMPLANT
GOWN STRL REUS W/ TWL LRG LVL3 (GOWN DISPOSABLE) ×2 IMPLANT
GOWN STRL REUS W/ TWL XL LVL3 (GOWN DISPOSABLE) ×1 IMPLANT
GOWN STRL REUS W/TWL LRG LVL3 (GOWN DISPOSABLE) ×4
GOWN STRL REUS W/TWL XL LVL3 (GOWN DISPOSABLE) ×2
HEMOSTAT SURGICEL 2X3 (HEMOSTASIS) ×3 IMPLANT
IV NS 500ML (IV SOLUTION) ×2
IV NS 500ML BAXH (IV SOLUTION) ×1 IMPLANT
KIT TURNOVER KIT A (KITS) ×3 IMPLANT
LABEL OR SOLS (LABEL) ×3 IMPLANT
LOOP RED MAXI  1X406MM (MISCELLANEOUS) ×2
LOOP VESSEL MAXI 1X406 RED (MISCELLANEOUS) ×1 IMPLANT
LOOP VESSEL MINI 0.8X406 BLUE (MISCELLANEOUS) ×1 IMPLANT
LOOPS BLUE MINI 0.8X406MM (MISCELLANEOUS) ×2
NEEDLE FILTER BLUNT 18X 1/2SAF (NEEDLE) ×2
NEEDLE FILTER BLUNT 18X1 1/2 (NEEDLE) ×1 IMPLANT
NS IRRIG 500ML POUR BTL (IV SOLUTION) ×3 IMPLANT
PACK EXTREMITY (MISCELLANEOUS) ×3 IMPLANT
PAD PREP 24X41 OB/GYN DISP (PERSONAL CARE ITEMS) ×3 IMPLANT
SOLUTION CELL SAVER (CLIP) ×1 IMPLANT
STOCKINETTE STRL 4IN 9604848 (GAUZE/BANDAGES/DRESSINGS) ×3 IMPLANT
SUT MNCRL AB 4-0 PS2 18 (SUTURE) ×3 IMPLANT
SUT PROLENE 6 0 BV (SUTURE) ×12 IMPLANT
SUT SILK 2 0 (SUTURE) ×2
SUT SILK 2-0 18XBRD TIE 12 (SUTURE) ×1 IMPLANT
SUT SILK 3 0 (SUTURE) ×2
SUT SILK 3-0 18XBRD TIE 12 (SUTURE) ×1 IMPLANT
SUT SILK 4 0 (SUTURE) ×2
SUT SILK 4-0 18XBRD TIE 12 (SUTURE) ×1 IMPLANT
SUT VIC AB 3-0 SH 27 (SUTURE) ×2
SUT VIC AB 3-0 SH 27X BRD (SUTURE) ×1 IMPLANT
SYR 20ML LL LF (SYRINGE) ×3 IMPLANT
SYR 3ML LL SCALE MARK (SYRINGE) ×3 IMPLANT
SYR TB 1ML 27GX1/2 LL (SYRINGE) IMPLANT

## 2020-05-24 NOTE — Progress Notes (Signed)
Progress Note  Patient Name: [name] [name] Date of Encounter: 05/24/2020  Primary Cardiologist: [name], MD   Subjective   No chest pain.  Reports improved breathing status.  Had a fall overnight in the bathroom and states that his right knee is very painful right now.  Inpatient Medications    Scheduled Meds: . sodium chloride   Intravenous Once  . B-complex with vitamin C  1 tablet Oral Daily  . busPIRone  20 mg Oral BID  . calcitRIOL  0.25 mcg Oral Q M,W,F  . carvedilol  3.125 mg Oral BID WC  . Chlorhexidine Gluconate Cloth  6 each Topical Once  . clopidogrel  75 mg Oral Daily  . feeding supplement  237 mL Oral Q24H  . finasteride  5 mg Oral Daily  . insulin aspart  0-5 Units Subcutaneous QHS  . insulin aspart  0-9 Units Subcutaneous TID WC  . isosorbide mononitrate  30 mg Oral Daily  . rosuvastatin  10 mg Oral Daily  . sertraline  100 mg Oral Daily  . sodium chloride flush  3 mL Intravenous Q12H  . tamsulosin  0.4 mg Oral Daily   Continuous Infusions: . sodium chloride    . clindamycin (CLEOCIN) IV    . furosemide 120 mg (05/23/20 1747)   PRN Meds: sodium chloride, acetaminophen, albuterol, cyclobenzaprine, fentaNYL, midazolam, nitroGLYCERIN, ondansetron (ZOFRAN) IV, ondansetron (ZOFRAN) IV, oxyCODONE, polyvinyl alcohol, sodium chloride flush   Vital Signs    Vitals:   05/23/20 1518 05/23/20 1901 05/24/20 0422 05/24/20 0732  BP: 111/75 129/73 119/70 (!) 137/59  Pulse: 94 89 77 89  Resp: 17 17  17   Temp: 98.3 F (36.8 C) 97.9 F (36.6 C) 97.7 F (36.5 C) (!) 97.5 F (36.4 C)  TempSrc: Oral Oral Oral Oral  SpO2: 95% 98% 100% 98%  Weight:   102.6 kg   Height:        Intake/Output Summary (Last 24 hours) at 05/24/2020 0819 Last data filed at 05/24/2020 0424 Gross per 24 hour  Intake 542 ml  Output 1275 ml  Net -733 ml   Last 3 Weights 05/24/2020 05/23/2020 05/22/2020  Weight (lbs) 226 lb 3.2 oz 228 lb 230 lb 2.6 oz  Weight (kg) 102.604 kg  103.42 kg 104.4 kg  Some encounter information is confidential and restricted. Go to Review Flowsheets activity to see all data.      Telemetry    NSR, 80s-90s- Personally Reviewed  ECG    No new tracings- Personally Reviewed  Physical Exam   GEN: No acute distress.  Seated in chair next to bed. Neck:  JVD difficult to assess due to body habitus Cardiac: RRR, 3/6 systolic murmur appreciated throughout the cardiac exam.  No rubs, or gallops.  Respiratory: Bilaterally reduced breath sounds GI: Soft, nontender, non-distended  MS:  bilateral moderate to 1+ edema (R>L); No deformity. Neuro:  Nonfocal  Psych: Normal affect   Labs    High Sensitivity Troponin:   Recent Labs  Lab 05/20/20 1509 05/20/20 1727  TROPONINIHS 70* 72*      Chemistry Recent Labs  Lab 05/22/20 0517 05/23/20 0429 05/24/20 0617  NA 136 135 135  K 4.0 3.6 3.9  CL 100 98 96*  CO2 24 26 24   GLUCOSE 203* 140* 151*  BUN 78* 81* 83*  CREATININE 3.94* 3.78* 3.84*  CALCIUM 7.9* 7.7* 8.2*  GFRNONAA 16* 17* 17*  ANIONGAP 12 11 15      Hematology Recent Labs  Lab 05/22/20  1119 05/23/20 0429 05/24/20 0617  WBC 6.2 6.8 7.1  RBC 2.01* 2.84* 3.06*  HGB 5.9* 8.5* 9.0*  HCT 18.9* 25.8* 27.8*  MCV 94.0 90.8 90.8  MCH 29.4 29.9 29.4  MCHC 31.2 32.9 32.4  RDW 17.0* 17.8* 17.3*  PLT 121* 128* 147*    BNPNo results for input(s): BNP, PROBNP in the last 168 hours.   DDimer No results for input(s): DDIMER in the last 168 hours.   Radiology    No results found.  Cardiac Studies   Echo 03/22/2020 1. Left ventricular ejection fraction, by estimation, is 30-35%. The left  ventricle has moderately decreased function. The left ventricle  demonstrates regional wall motion abnormalities, basal to mid inferior and  inferoseptal akinesis, basal to mid  inferolateral severe hypokinesis. The left ventricular internal cavity  size was mildly dilated. There is mild left ventricular hypertrophy. Left    ventricular diastolic parameters are consistent with Grade II diastolic  dysfunction (pseudonormalization).  2. Right ventricular systolic function is moderately reduced. The right  ventricular size is normal. There is mildly elevated pulmonary artery  systolic pressure. The estimated right ventricular systolic pressure is  74.1 mmHg.  3. Left atrial size was moderately dilated.  4. The mitral valve is degenerative. Moderate to severe mitral valve  regurgitation. No evidence of mitral stenosis.  5. The aortic valve is tricuspid. Aortic valve regurgitation is mild.  Moderate aortic valve stenosis. Aortic valve area, by VTI measures 1.08  cm. Aortic valve mean gradient measures 22.0 mmHg.  6. Aortic dilatation noted. There is mild dilatation of the ascending  aorta measuring 42 mm.  7. The inferior vena cava is dilated in size with >50% respiratory  variability, suggesting right atrial pressure of 8 mmHg.   Cardiac Cath 03/23/2020  Ost RCA to Prox RCA lesion is 99% stenosed.  Post intervention, there is a 0% residual stenosis.  A drug-eluting stent was successfully placed using a STENT   Patient Profile     64 y.o. male with hx of HFrEF (EF 30 to 35%, 02/2020), CAD s/p CABG for three-vessel CAD (2012), aortic stenosis, mitral regurgitation, left-sided carotid endarterectomy, PAD followed by [facility], hypertension, hyperlipidemia, tobacco use, DM2, obesity, psoriasis, CKD/ESRD with plan to insert access for hemodialysis today 10/29, and who is being seen today for the evaluation of acute exacerbation of systolic heart failure.  Assessment & Plan    Acute on chronic HFrEF --Reports improvement of shortness of breath.  H/O LVSF at 30 to 35%, PAH, and elevated right heart pressures.   --IV Lasix increased to 120 mg twice daily on 11/1.  Net -733.0 cc yesterday with weight 103.4kg  102.6kg.  Diuresis has been limited by CKD.  Consider adding metolazone given he still has less than  optimal urine output on increased dose of furosemide. --Daily BMET. Cr 3.78  3.84. Nephrology following, and he is scheduled for AV fistula placement today. Replete electrolytes as needed with current potassium low. --Continue current medications, including carvedilol, hydralazine.  No ACE/ARB/Arni/MRA secondary to CKD.  Continue BiPAP.  Hypokalemia --K3.9.  Replete with goal 4.0. Daily BMET.   Severe aortic stenosis --Recent echo as above with aortic valve area 1.08cm and aortic valve mean gradient 22.0 mmHg.  Currently undergoing evaluation for TAVR by structural heart team with most recent CT showing that showed severe calcification of trileaflet aortic valve and suitable anatomy for TAVR.  Given his severe valvular disease, he is unable to tolerate large shifts in fluid.  Caution with  administration of IVF/transfusions.  Severe anemia, acute blood loss anemia --Current hemoglobin 9.0 with hematocrit 27.8.    --Was seen by GI with no endoscopic procedures recommended.  He has suffered from acute blood loss anemia, which was thought to possibly be due to AV malformation in the setting of severe aortic stenosis. --Transfusion as needed to maintain hemoglobin above 8.0.   --As above, he is unable to tolerate large shifts in fluid with recommendation for caution during any transfusion.   --Continue to monitor H&H with daily CBC.  CAD s/p CABG in 2012 s/p recent PCI/DES to the RCA 03/23/2020 --No reported angina.  ASA held given hemoglobin.  Continue Plavix 75 mg, given his recent stenting. Continue rosuvastatin 10 mg and Imdur 30 mg daily.  ESRD --Followed by nephrology.  S/p vein mapping by Dr. Delana Meyer to obtain vascular access.  AV fistula placement scheduled for today.  HTN --BP labile today. Monitor.  Continue current medications.  Suspect improvement of BP with diuresis.  HLD -Continue Crestor 10 mg.   New York Heart Association (NYHA) Functional Class NYHA Class IV     For questions or updates, please contact [facility] Please consult [email or web address] for contact info under        Signed, Arvil Chaco, PA-C  05/24/2020, 8:19 AM

## 2020-05-24 NOTE — Op Note (Signed)
[facility]   OPERATIVE NOTE   PROCEDURE: Left brachiocephalic arteriovenous fistula placement  PRE-OPERATIVE DIAGNOSIS: 1.  CKD stage 4/5, nearing dialysis dependence       POST-OPERATIVE DIAGNOSIS: 1. Same as above  SURGEON: Leotis Pain, MD  ASSISTANT(S): Hezzie Bump, PA-C  ANESTHESIA: general  ESTIMATED BLOOD LOSS: 15 cc  FINDING(S): Adequate cephalic vein for fistula creation  SPECIMEN(S):  none  INDICATIONS:   [name] [name] is a 64 y.o. male who presents with renal failure in need of pemanent dialysis acces.  The patient is scheduled for left arm AVF placement.  The patient is aware the risks include but are not limited to: bleeding, infection, steal syndrome, nerve damage, ischemic monomelic neuropathy, failure to mature, and need for additional procedures.  The patient is aware of the risks of the procedure and elects to proceed forward.  DESCRIPTION: After full informed written consent was obtained from the patient, the patient was brought back to the operating room and placed supine upon the operating table.  Prior to induction, the patient received IV antibiotics.   After obtaining adequate anesthesia, the patient was then prepped and draped in the standard fashion for a left arm access procedure.  I made a curvilinear incision at the level of the antecubital fossa and dissected through the subcutaneous tissue and fascia to gain exposure of the brachial artery.  This was noted to be patent and adequate in size for fistula creation.  This was dissected out proximally and distally and prepared for control with vessel loops .  I then dissected out the cephalic vein.  This was noted to be patent and adequate in size for fistula creation.  I then gave the patient 3000 units of intravenous heparin.  The vein was marked for orientation and the distal segment of the vein was ligated with a  2-0 silk, and the vein was transected.  I then instilled the  heparinized saline into the vein and clamped it.  At this point, I reset my exposure of the brachial artery and pulled up control on the vessel loops.  I made an arteriotomy with a #11 blade, and then I extended the arteriotomy with a Potts scissor.  I injected heparinized saline proximal and distal to this arteriotomy.  The vein was then sewn to the artery in an end-to-side configuration with a running stitch of 6-0 Prolene.  Prior to completing this anastomosis, I allowed the vein and artery to backbleed.  There was no evidence of clot from any vessels.  I completed the anastomosis in the usual fashion and then released all vessel loops and clamps.  There was a palpable  thrill in the venous outflow, and there was a palpable pulse in the artery distal to the anastomosis.  At this point, I irrigated out the surgical wound.  Surgicel was placed. There was no further active bleeding.  The subcutaneous tissue was reapproximated with a running stitch of 3-0 Vicryl.  The skin was then closed with a 4-0 Monocryl suture.  The skin was then cleaned, dried, and reinforced with Dermabond.  The patient tolerated this procedure well and was taken to the recovery room in stable condition  COMPLICATIONS: None  CONDITION: Stable   Leotis Pain    05/24/2020, 3:33 PM  This note was created with Dragon Medical transcription system. Any errors in dictation are purely unintentional.

## 2020-05-24 NOTE — Interval H&P Note (Signed)
History and Physical Interval Note:  05/24/2020 2:01 PM  [name] [name]  has presented today for surgery, with the diagnosis of ESRD.  The various methods of treatment have been discussed with the patient and family. After consideration of risks, benefits and other options for treatment, the patient has consented to  Procedure(s): ARTERIOVENOUS (AV) FISTULA CREATION (Brachio-cephalic) (N/A) as a surgical intervention.  The patient's history has been reviewed, patient examined, no change in status, stable for surgery.  I have reviewed the patient's chart and labs.  Questions were answered to the patient's satisfaction.     Leotis Pain

## 2020-05-24 NOTE — Anesthesia Preprocedure Evaluation (Signed)
Anesthesia Evaluation  Patient identified by MRN, date of birth, ID band Patient awake    Reviewed: Allergy & Precautions, H&P , NPO status , Patient's Chart, lab work & pertinent test results, reviewed documented beta blocker date and time   History of Anesthesia Complications Negative for: history of anesthetic complications  Airway Mallampati: III  TM Distance: >3 FB Neck ROM: Full    Dental  (+) Edentulous Upper, Edentulous Lower   Pulmonary shortness of breath, at rest and lying, asthma , sleep apnea and Continuous Positive Airway Pressure Ventilation , COPD, Not current smoker, former smoker,    Pulmonary exam normal breath sounds clear to auscultation       Cardiovascular Exercise Tolerance: Poor METS: < 3 Mets hypertension, + angina + CAD, + Past MI, + Cardiac Stents, + CABG, + Peripheral Vascular Disease and +CHF  Normal cardiovascular examIII+ Valvular Problems/Murmurs AS and MR  Rhythm:Regular Rate:Normal + Systolic murmurs Recent MI/Stent 03/23/2020. TTE: 1. Left ventricular ejection fraction, by estimation, is 30-35%. The left  ventricle has moderately decreased function. The left ventricle  demonstrates regional wall motion abnormalities, basal to mid inferior and  inferoseptal akinesis, basal to mid  inferolateral severe hypokinesis. The left ventricular internal cavity  size was mildly dilated. There is mild left ventricular hypertrophy. Left  ventricular diastolic parameters are consistent with Grade II diastolic  dysfunction (pseudonormalization).  2. Right ventricular systolic function is moderately reduced. The right  ventricular size is normal. There is mildly elevated pulmonary artery  systolic pressure. The estimated right ventricular systolic pressure is  76.2 mmHg.  3. Left atrial size was moderately dilated.  4. The mitral valve is degenerative. Moderate to severe mitral valve  regurgitation. No  evidence of mitral stenosis.  5. The aortic valve is tricuspid. Aortic valve regurgitation is mild.  Moderate aortic valve stenosis. Aortic valve area, by VTI measures 1.08  cm. Aortic valve mean gradient measures 22.0 mmHg.  6. Aortic dilatation noted. There is mild dilatation of the ascending  aorta measuring 42 mm.  7. The inferior vena cava is dilated in size with >50% respiratory  variability, suggesting right atrial pressure of 8 mmHg.   LHC:  Ost RCA to Prox RCA lesion is 99% stenosed.  Post intervention, there is a 0% residual stenosis.  A drug-eluting stent was successfully placed using a STENT RESOLUTE ONYX 4.0X18.   Successful angioplasty and drug-eluting stent placement to the ostial right coronary artery.  60 mL of contrast was used.  Recommendations: Continue dual antiplatelet therapy for at least 6 months. The patient can undergo endoscopic GI procedures from a cardiac standpoint at an overall moderate risk.  Although his aortic stenosis is severe, it does not seem to be critical.  Maintain a hemoglobin above 8. The patient will likely require starting dialysis very soon in order to facilitate continued evaluation for TAVR.     Neuro/Psych  Headaches, PSYCHIATRIC DISORDERS Anxiety Depression  Neuromuscular disease CVA    GI/Hepatic GERD  Controlled,  Endo/Other  diabetes  Renal/GU ESRFRenal disease     Musculoskeletal   Abdominal (+) + obese,   Peds  Hematology   Anesthesia Other Findings Past Medical History: No date: 3-vessel CAD     Comment:  8/30 LHC with radial graft to RCA occluded at the ostium              with critical stenosis of the ostial native RCA. No date: Adenomatous colon polyp No date: Allergy No date: Anxiety No date:  Arthritis     Comment:  RA 2007: Bell's palsy No date: CAD (coronary artery disease)     Comment:  03/21/2020 LHC with patent LIMA to LAD and SVG to OM 3.                Radial graft to RCA occluded at the  ostium with critical               stenosis of the ostial and native RCA. No date: Carotid artery occlusion     Comment:  Bilateral carotid stenosis s/p left-sided CEA. No date: Cataract     Comment:  Dr. Dawna Part No date: CKD (chronic kidney disease), stage IV ([place])     Comment:  03/2020 vascular surgery access planned No date: Depression No date: Diabetes mellitus No date: Diverticulosis No date: Duodenitis No date: DVT (deep venous thrombosis) (HCC)     Comment:  in leg No date: ESRD (end stage renal disease) ([place]) No date: Gastropathy No date: GERD (gastroesophageal reflux disease) No date: Heart failure with preserved ejection fraction ([place])     Comment:  8/30 RHC with moderately to severely elevated filling               pressures and pulmonary wedge pressure 31 mmHg, moderate               pulmonary hypertension No date: Helicobacter pylori gastritis No date: Hx of CABG     Comment:  2012 No date: Hyperlipidemia No date: Hypertension No date: Mitral regurgitation and aortic stenosis     Comment:  RHC 8/30 and echo 12/2019. 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 No date: Sleep apnea     Comment:  uses cpap No date: Stroke [facility]) No date: Tobacco use disorder     Comment:  recently quit 08/2013: Tubular adenoma     Comment:  Dr. Hilarie Fredrickson No date: Weak urinary stream   Reproductive/Obstetrics                             Anesthesia Physical  Anesthesia Plan  ASA: IV  Anesthesia Plan: MAC   Post-op Pain Management:  Regional for Post-op pain   Induction: Intravenous  PONV Risk Score and Plan: 1 and  Midazolam, Ondansetron and Treatment may vary due to age or medical condition  Airway Management Planned: Nasal Cannula and Natural Airway  Additional Equipment: None  Intra-op Plan:   Post-operative Plan:   Informed Consent: I have reviewed the patients History and Physical, chart, labs and discussed the procedure including the risks, benefits and alternatives for the proposed anesthesia with the patient or authorized representative who has indicated his/her understanding and acceptance.     Dental Advisory Given  Plan Discussed with: CRNA and Surgeon  Anesthesia Plan Comments: (Current admission for acute on chronic HF and anemia. Has been treated with blood transfusion and aggressive diuresis with improvement in symptoms.   Discussed risks of anesthesia with patient, including possibility of  difficulty with spontaneous ventilation under anesthesia necessitating airway intervention, PONV, and rare risks such as cardiac or respiratory or neurological events. Patient understands. Patient counseled on being higher risk for anesthesia due to comorbidities: CHF, very recent stent/MI, OSA. Patient was told about increased risk of cardiac and respiratory events, including death. Patient understands. )        Anesthesia Quick Evaluation

## 2020-05-24 NOTE — Anesthesia Procedure Notes (Signed)
Anesthesia Regional Block: Supraclavicular block   Pre-Anesthetic Checklist: ,, timeout performed, Correct Patient, Correct Site, Correct Laterality, Correct Procedure, Correct Position, site marked, Risks and benefits discussed,  Surgical consent,  Pre-op evaluation,  At surgeon's request and post-op pain management  Laterality: Left  Prep: chloraprep       Needles:  Injection technique: Single-shot  Needle Type: Stimiplex     Needle Length: 10cm  Needle Gauge: 21     Additional Needles:   Procedures:,,,, ultrasound used (permanent image in chart),,,,  Narrative:  Start time: 05/24/2020 2:10 PM End time: 05/24/2020 2:14 PM Injection made incrementally with aspirations every 5 mL.  Performed by: Personally  Anesthesiologist: Emmie Niemann, MD  Additional Notes: Functioning IV was confirmed and monitors were applied.  A Stimuplex needle was used. Sterile prep and drape,hand hygiene and sterile gloves were used.  Negative aspiration and negative test dose prior to incremental administration of local anesthetic. The patient tolerated the procedure well.

## 2020-05-24 NOTE — Anesthesia Postprocedure Evaluation (Signed)
Anesthesia Post Note  Patient: [name] [name]  Procedure(s) Performed: ARTERIOVENOUS (AV) FISTULA CREATION (Brachio-cephalic) (N/A Arm Lower)  Patient location during evaluation: PACU Anesthesia Type: MAC and Regional Level of consciousness: awake and alert Pain management: pain level controlled Vital Signs Assessment: post-procedure vital signs reviewed and stable Respiratory status: spontaneous breathing, nonlabored ventilation, respiratory function stable and patient connected to nasal cannula oxygen Cardiovascular status: stable and blood pressure returned to baseline Postop Assessment: no apparent nausea or vomiting Anesthetic complications: no   No complications documented.   Last Vitals:  Vitals:   05/24/20 1656 05/24/20 2009  BP: 117/69 100/80  Pulse: 84 97  Resp: 17   Temp: 36.7 C (!) 36.4 C  SpO2: 99% 95%    Last Pain:  Vitals:   05/24/20 2009  TempSrc: Oral  PainSc: 7                  Martha Clan

## 2020-05-24 NOTE — Transfer of Care (Signed)
Immediate Anesthesia Transfer of Care Note  Patient: [name] [name]  Procedure(s) Performed: ARTERIOVENOUS (AV) FISTULA CREATION (Brachio-cephalic) (N/A Arm Lower)  Patient Location: PACU  Anesthesia Type:General  Level of Consciousness: sedated  Airway & Oxygen Therapy: Patient Spontanous Breathing and Patient connected to face mask oxygen  Post-op Assessment: Report given to RN and Post -op Vital signs reviewed and stable  Post vital signs: Reviewed and stable  Last Vitals:  Vitals Value Taken Time  BP 126/69 05/24/20 1548  Temp 36.3 C 05/24/20 1548  Pulse 83 05/24/20 1548  Resp 17 05/24/20 1548  SpO2 98 % 05/24/20 1548    Last Pain:  Vitals:   05/24/20 1333  TempSrc: Oral  PainSc: 2       Patients Stated Pain Goal: 0 (71/29/29 0903)  Complications: No complications documented.

## 2020-05-24 NOTE — Progress Notes (Addendum)
[facility]  ROUNDING NOTE   Subjective:   Patient awake,alert, sitting on a recliner.He is NPO for vascular procedure, AV fistula creation today.  Objective:  Vital signs in last 24 hours:  Temp:  [97.3 F (36.3 C)-98.3 F (36.8 C)] 97.3 F (36.3 C) (11/02 1333) Pulse Rate:  [77-94] 80 (11/02 1418) Resp:  [16-28] 28 (11/02 1418) BP: (111-141)/(58-79) 134/76 (11/02 1418) SpO2:  [95 %-100 %] 99 % (11/02 1418) Weight:  [102.6 kg] 102.6 kg (11/02 0422)  Weight change: -0.816 kg Filed Weights   05/22/20 0500 05/23/20 0630 05/24/20 0422  Weight: 104.4 kg 103.4 kg 102.6 kg    Intake/Output: I/O last 3 completed shifts: In: 7096 [P.O.:1080; Blood:372; IV Piggyback:62] Out: 1600 [Urine:1600]   Intake/Output this shift:  Total I/O In: -  Out: 650 [Urine:650]  Physical Exam: General: Sitting up on recliner,pleasant  Head:  Normocephalic,Atraumatic  Eyes: Sclerae and conjunctivae clear  Neck: Supple, trachea midline  Lungs:  Lungs with fine crackles, respirations even,unlabored  Heart: Regular rate and rhythm  Abdomen:  Soft, nontender, nontender  Extremities:  2+ peripheral edema.  Neurologic: Alert,awake, speech clear and appropriate  Skin: No acute lesions or rashes       Basic Metabolic Panel: Recent Labs  Lab 05/18/20 1057 05/18/20 1057 05/21/20 0611 05/21/20 0611 05/22/20 0517 05/23/20 0429 05/24/20 0617  NA 139  --  135  --  136 135 135  K 4.9  --  3.9  --  4.0 3.6 3.9  CL 105  --  102  --  100 98 96*  CO2 26  --  22  --  24 26 24   GLUCOSE 185*  --  153*  --  203* 140* 151*  BUN 55*  --  71*  --  78* 81* 83*  CREATININE 3.25*  --  3.79*  --  3.94* 3.78* 3.84*  CALCIUM 8.5*   < > 7.9*   < > 7.9* 7.7* 8.2*  MG  --   --   --   --   --   --  2.2   < > = values in this interval not displayed.    Liver Function Tests: No results for input(s): AST, ALT, ALKPHOS, BILITOT, PROT, ALBUMIN in the last 168 hours. No results for input(s): LIPASE,  AMYLASE in the last 168 hours. No results for input(s): AMMONIA in the last 168 hours.  CBC: Recent Labs  Lab 05/18/20 1057 05/22/20 1119 05/23/20 0429 05/24/20 0617  WBC 8.2 6.2 6.8 7.1  NEUTROABS 5.7 4.2 4.3 4.7  HGB 7.5* 5.9* 8.5* 9.0*  HCT 24.3* 18.9* 25.8* 27.8*  MCV 95.3 94.0 90.8 90.8  PLT 160 121* 128* 147*    Cardiac Enzymes: No results for input(s): CKTOTAL, CKMB, CKMBINDEX, TROPONINI in the last 168 hours.  BNP: Invalid input(s): POCBNP  CBG: Recent Labs  Lab 05/23/20 1152 05/23/20 1637 05/23/20 2046 05/24/20 0734 05/24/20 1200  GLUCAP 132* 152* 222* 141* 139*    Microbiology: Results for orders placed or performed during the hospital encounter of 05/18/20  SARS CORONAVIRUS 2 (TAT 6-24 HRS) Nasopharyngeal Nasopharyngeal Swab     Status: None   Collection Time: 05/18/20 11:54 AM   Specimen: Nasopharyngeal Swab  Result Value Ref Range Status   SARS Coronavirus 2 NEGATIVE NEGATIVE Final    Comment: (NOTE) SARS-CoV-2 target nucleic acids are NOT DETECTED.  The SARS-CoV-2 RNA is generally detectable in upper and lower respiratory specimens during the acute phase of infection. Negative results  do not preclude SARS-CoV-2 infection, do not rule out co-infections with other pathogens, and should not be used as the sole basis for treatment or other patient management decisions. Negative results must be combined with clinical observations, patient history, and epidemiological information. The expected result is Negative.  Fact Sheet for Patients: SugarRoll.be  Fact Sheet for Healthcare Providers: https://www.woods-mathews.com/  This test is not yet approved or cleared by the [place] FDA and  has been authorized for detection and/or diagnosis of SARS-CoV-2 by FDA under an Emergency Use Authorization (EUA). This EUA will remain  in effect (meaning this test can be used) for the duration of the COVID-19  declaration under Se ction 564(b)(1) of the Act, 21 U.S.C. section 360bbb-3(b)(1), unless the authorization is terminated or revoked sooner.  Performed at [facility], [place] [address] [place], [place] [address]    *Note: Due to a large number of results and/or encounters for the requested time period, some results have not been displayed. A complete set of results can be found in Results Review.    Coagulation Studies: Recent Labs    05/24/20 0617  LABPROT 13.2  INR 1.0    Urinalysis: No results for input(s): COLORURINE, LABSPEC, PHURINE, GLUCOSEU, HGBUR, BILIRUBINUR, KETONESUR, PROTEINUR, UROBILINOGEN, NITRITE, LEUKOCYTESUR in the last 72 hours.  Invalid input(s): APPERANCEUR    Imaging: [place] OR NERVE BLOCK-IMAGE ONLY [facility])  Result Date: 05/24/2020 There is no interpretation for this exam.  This order is for images obtained during a surgical procedure.  Please See "Surgeries" Tab for more information regarding the procedure.     Medications:   . [MAR Hold] sodium chloride    . clindamycin    . clindamycin (CLEOCIN) IV    . [MAR Hold] furosemide 120 mg (05/24/20 0823)   . [MAR Hold] B-complex with vitamin C  1 tablet Oral Daily  . [MAR Hold] busPIRone  20 mg Oral BID  . [MAR Hold] calcitRIOL  0.25 mcg Oral Q M,W,F  . [MAR Hold] carvedilol  3.125 mg Oral BID WC  . [MAR Hold] clopidogrel  75 mg Oral Daily  . [MAR Hold] feeding supplement  237 mL Oral Q24H  . [MAR Hold] finasteride  5 mg Oral Daily  . [MAR Hold] insulin aspart  0-5 Units Subcutaneous QHS  . [MAR Hold] insulin aspart  0-9 Units Subcutaneous TID WC  . [MAR Hold] isosorbide mononitrate  30 mg Oral Daily  . [MAR Hold] rosuvastatin  10 mg Oral Daily  . [MAR Hold] sertraline  100 mg Oral Daily  . [MAR Hold] sodium chloride flush  3 mL Intravenous Q12H  . [MAR Hold] tamsulosin  0.4 mg Oral Daily   [MAR Hold] sodium chloride, [MAR Hold] acetaminophen, [MAR Hold] albuterol, [MAR Hold] cyclobenzaprine,  [MAR Hold] fentaNYL, [MAR Hold] midazolam, [MAR Hold] nitroGLYCERIN, [MAR Hold] ondansetron (ZOFRAN) IV, [MAR Hold] ondansetron (ZOFRAN) IV, [MAR Hold] oxyCODONE, [MAR Hold] polyvinyl alcohol, [MAR Hold] sodium chloride flush  Assessment/ Plan:  Mr. DAVY WESTMORELAND is a 64 y.o.  male with a history of PVD, Hypertension, Depression, severe aortic stenosis, obesity, DM type II, COPD, CAD with stent placement,  anemia and CKD stage IV who is admitted for Acute on chronic combined systolic and diastolic CHF.   1. Chronic Kidney disease stage IV Patients baseline creatinine is approximately 3.4 Renal function stays relatively stable Patient is going for AV Fistula creation by vascular team today in anticipation for dialysis requirement in near future No acute indication for dialysis  today  2. Anemia of CKD Received PRBC transfusion during this admission Hemoglobin 9.0 Will continue monitoring  3. Hypertension Blood pressure readings slightly above goal  Continue current antihypertensive regimen  4. Secondary hyperparathyroidism  Calcium and Phosphorus levels not at goal We will continue monitoring bone mineral metabolism parameters No acute indication for Phos binders   LOS: 4 Princy Raju 11/2/20212:59 PM   Patient was seen and examined with Crosby Oyster, DNP. Above plan was discussed and agreed upon on the signing of this note.   Lavonia Dana, [facility]  11/2/20214:06 PM

## 2020-05-24 NOTE — Progress Notes (Addendum)
Progress Note    [name] [name]  OQH:[id number] DOB: 09-Jun-1956  DOA: 05/20/2020 PCP: [name], MD      Brief Narrative:    Medical records reviewed and are as summarized below:  [name] [name] is a 64 y.o. male with medical history significant forcoronary artery disease status post three-vessel CABG, status post recent PCI with stent angioplasty of the ostial right coronary artery, history of diabetes mellitus with stage IV chronic kidney disease, severe AS and hypertension who presented as an outpatient for creation of an AV fistula (brachiocephalic) in preparation for initiation of renal replacement therapy.  He was given Versed for sedation.  Treated procedure and he developed acute respiratory distress requiring noninvasive mechanical ventilation.  He was also hypoglycemic with initial blood glucose levels in the 40s.  This was treated with IV dextrose.  He was admitted to the hospital for acute exacerbation of chronic diastolic CHF.  He was treated with IV Lasix.  Cardiologist was consulted to assist with treatment.  He said he has been having black stools for close to 6 weeks.  He was found to have severe anemia secondary to chronic GI blood loss.  He was transfused with total of 3 units of packed red blood cells.  He was evaluated by Dr. Vicente Males,  Gastroenterologist, for possible endoscopic work-up.  However, according to the gastroenterologist, patient is at increased risk for cardiovascular complications from endoscopy and therefore no endoscopic work-up will be performed at this time.  He was also seen by the nephrologist and the vascular surgeon.  Left brachiocephalic AV fistula was successfully placed on 05/24/2020.      Assessment/Plan:   Principal Problem:   Acute on chronic combined systolic and diastolic CHF (congestive heart failure) (HCC) Active Problems:   DM (diabetes mellitus), type 2 with complications (HCC)   CAD (coronary artery disease)   CKD  (chronic kidney disease), stage IV (HCC)   Aortic stenosis   Anemia in chronic kidney disease   Acute on chronic combined systolic (congestive) and diastolic (congestive) heart failure (HCC)   Body mass index is 36.51 kg/m. (Morbid obesity)   Acute exacerbation of chronic systolic and diastolic CHF, moderate aortic stenosis, moderate to severe mitral regurgitation: Continue IV Lasix.  Monitor urine output, BMP and daily weight.  Patient is being evaluated for possible TAVR in the next few months. 2D echo in August 2021 showed EF estimated at 30 to 54%, grade 2 diastolic dysfunction, moderate to severe MR, moderate aortic stenosis  AKI on CKD stage IV: Continue IV Lasix.  Plan for placement of left AV fistula today.  Follow-up with nephrologist and vascular surgeon.  GI bleeding/Acute on chronic blood loss anemia, iron deficiency anemia: H&H improved s/p transfusion with 2 units of packed red blood cells 05/22/2020. S/p transfusion with 1 unit of PRBCs on 05/20/2020.  He has been seen by the gastroenterologist and there is no plan for endoscopic work-up because of high cardiovascular risk.   No significant findings except for hiatal hernia on recent EGD on 04/11/2020.  Colonoscopy in October 2016 showed few polyps that were resected.  Mild thrombocytopenia: Platelet count is improving.  Monitor platelet count.  Insulin-dependent diabetes mellitus with recent hypoglycemia; peripheral neuropathy: Hold home insulin NPH.  Monitor glucose closely and use NovoLog as needed for hyperglycemia.  He said he has used gabapentin and Lyrica in the past but he did not like them.  CAD s/p coronary stent on 03/23/2020, h/o CABG: He  is on carvedilol, isosorbide mononitrate, Plavix and rosuvastatin  BPH: Continue finasteride and Flomax  OSA: CPAP at night and as needed for sleep.   Diet Order            Diet NPO time specified  Diet effective midnight                     Consultants:  Cardiologist  Nephrologist  Vascular surgeon  Gastroenterologist  Procedures:  Plan for left AV fistula placement    Medications:   . [MAR Hold] B-complex with vitamin C  1 tablet Oral Daily  . bupivacaine      . [MAR Hold] busPIRone  20 mg Oral BID  . [MAR Hold] calcitRIOL  0.25 mcg Oral Q M,W,F  . [MAR Hold] carvedilol  3.125 mg Oral BID WC  . [MAR Hold] clopidogrel  75 mg Oral Daily  . [MAR Hold] feeding supplement  237 mL Oral Q24H  . [MAR Hold] finasteride  5 mg Oral Daily  . [MAR Hold] insulin aspart  0-5 Units Subcutaneous QHS  . [MAR Hold] insulin aspart  0-9 Units Subcutaneous TID WC  . [MAR Hold] isosorbide mononitrate  30 mg Oral Daily  . lidocaine (PF)      . midazolam      . [MAR Hold] rosuvastatin  10 mg Oral Daily  . [MAR Hold] sertraline  100 mg Oral Daily  . [MAR Hold] sodium chloride flush  3 mL Intravenous Q12H  . [MAR Hold] tamsulosin  0.4 mg Oral Daily   Continuous Infusions: . [MAR Hold] sodium chloride    . clindamycin (CLEOCIN) IV    . [MAR Hold] furosemide 120 mg (05/24/20 0823)     Anti-infectives (From admission, onward)   Start     Dose/Rate Route Frequency Ordered Stop   05/24/20 0600  clindamycin (CLEOCIN) IVPB 600 mg        600 mg 100 mL/hr over 30 Minutes Intravenous On call to O.R. 05/23/20 2200 05/25/20 0559   05/20/20 0630  clindamycin (CLEOCIN) IVPB 300 mg  Status:  Discontinued        300 mg 100 mL/hr over 30 Minutes Intravenous On call to O.R. 05/20/20 0621 05/20/20 1500             Family Communication/Anticipated D/C date and plan/Code Status   DVT prophylaxis: SCD's Start: 05/23/20 2201 Place and maintain sequential compression device Start: 05/23/20 1444 SCDs Start: 05/20/20 0946     Code Status: Full Code  Family Communication: None Disposition Plan:    Status is: Inpatient  Remains inpatient appropriate because:Ongoing diagnostic testing needed not appropriate for  outpatient work up and Inpatient level of care appropriate due to severity of illness   Dispo: The patient is from: Home              Anticipated d/c is to: Home              Anticipated d/c date is: 2 days              Patient currently is not medically stable to d/c.           Subjective:   Interval events noted.  No shortness of breath or chest pain.  He has pain in bilateral legs, likely from his neuropathy.  Objective:    Vitals:   05/24/20 0422 05/24/20 0732 05/24/20 1202 05/24/20 1333  BP: 119/70 (!) 137/59 114/78 (!) 116/58  Pulse: 77 89 81 79  Resp:  17 17 16   Temp: 97.7 F (36.5 C) (!) 97.5 F (36.4 C) (!) 97.5 F (36.4 C) (!) 97.3 F (36.3 C)  TempSrc: Oral Oral Oral Oral  SpO2: 100% 98% 98% 99%  Weight: 102.6 kg     Height:       No data found.   Intake/Output Summary (Last 24 hours) at 05/24/2020 1351 Last data filed at 05/24/2020 1255 Gross per 24 hour  Intake 542 ml  Output 1925 ml  Net -1383 ml   Filed Weights   05/22/20 0500 05/23/20 0630 05/24/20 0422  Weight: 104.4 kg 103.4 kg 102.6 kg    Exam:   GEN: NAD SKIN: No rash EYES: EOMI ENT: MMM CV: RRR, systolic murmur in the aortic area PULM: CTA B ABD: soft, ND, NT, +BS CNS: AAO x 3, non focal EXT: Bilateral leg edema, no tenderness         Data Reviewed:   I have personally reviewed following labs and imaging studies:  Labs: Labs show the following:   Basic Metabolic Panel: Recent Labs  Lab 05/18/20 1057 05/18/20 1057 05/21/20 0611 05/21/20 0611 05/22/20 0517 05/22/20 0517 05/23/20 0429 05/24/20 0617  NA 139  --  135  --  136  --  135 135  K 4.9   < > 3.9   < > 4.0   < > 3.6 3.9  CL 105  --  102  --  100  --  98 96*  CO2 26  --  22  --  24  --  26 24  GLUCOSE 185*  --  153*  --  203*  --  140* 151*  BUN 55*  --  71*  --  78*  --  81* 83*  CREATININE 3.25*  --  3.79*  --  3.94*  --  3.78* 3.84*  CALCIUM 8.5*  --  7.9*  --  7.9*  --  7.7* 8.2*  MG  --   --    --   --   --   --   --  2.2   < > = values in this interval not displayed.   GFR Estimated Creatinine Clearance: 21.2 mL/min (A) (by C-G formula based on SCr of 3.84 mg/dL (H)). Liver Function Tests: No results for input(s): AST, ALT, ALKPHOS, BILITOT, PROT, ALBUMIN in the last 168 hours. No results for input(s): LIPASE, AMYLASE in the last 168 hours. No results for input(s): AMMONIA in the last 168 hours. Coagulation profile Recent Labs  Lab 05/18/20 1057 05/24/20 0617  INR 1.0 1.0    CBC: Recent Labs  Lab 05/18/20 1057 05/22/20 1119 05/23/20 0429 05/24/20 0617  WBC 8.2 6.2 6.8 7.1  NEUTROABS 5.7 4.2 4.3 4.7  HGB 7.5* 5.9* 8.5* 9.0*  HCT 24.3* 18.9* 25.8* 27.8*  MCV 95.3 94.0 90.8 90.8  PLT 160 121* 128* 147*   Cardiac Enzymes: No results for input(s): CKTOTAL, CKMB, CKMBINDEX, TROPONINI in the last 168 hours. BNP (last 3 results) No results for input(s): PROBNP in the last 8760 hours. CBG: Recent Labs  Lab 05/23/20 1152 05/23/20 1637 05/23/20 2046 05/24/20 0734 05/24/20 1200  GLUCAP 132* 152* 222* 141* 139*   D-Dimer: No results for input(s): DDIMER in the last 72 hours. Hgb A1c: No results for input(s): HGBA1C in the last 72 hours. Lipid Profile: No results for input(s): CHOL, HDL, LDLCALC, TRIG, CHOLHDL, LDLDIRECT in the last 72 hours. Thyroid function studies: No results for input(s): TSH, T4TOTAL, T3FREE, THYROIDAB  in the last 72 hours.  Invalid input(s): FREET3 Anemia work up: Recent Labs    05/22/20 1352  FERRITIN 31  TIBC 441  IRON 43*   Sepsis Labs: Recent Labs  Lab 05/18/20 1057 05/22/20 1119 05/23/20 0429 05/24/20 0617  WBC 8.2 6.2 6.8 7.1    Microbiology Recent Results (from the past 240 hour(s))  SARS CORONAVIRUS 2 (TAT 6-24 HRS) Nasopharyngeal Nasopharyngeal Swab     Status: None   Collection Time: 05/18/20 11:54 AM   Specimen: Nasopharyngeal Swab  Result Value Ref Range Status   SARS Coronavirus 2 NEGATIVE NEGATIVE Final     Comment: (NOTE) SARS-CoV-2 target nucleic acids are NOT DETECTED.  The SARS-CoV-2 RNA is generally detectable in upper and lower respiratory specimens during the acute phase of infection. Negative results do not preclude SARS-CoV-2 infection, do not rule out co-infections with other pathogens, and should not be used as the sole basis for treatment or other patient management decisions. Negative results must be combined with clinical observations, patient history, and epidemiological information. The expected result is Negative.  Fact Sheet for Patients: SugarRoll.be  Fact Sheet for Healthcare Providers: https://www.woods-mathews.com/  This test is not yet approved or cleared by the [place] FDA and  has been authorized for detection and/or diagnosis of SARS-CoV-2 by FDA under an Emergency Use Authorization (EUA). This EUA will remain  in effect (meaning this test can be used) for the duration of the COVID-19 declaration under Se ction 564(b)(1) of the Act, 21 U.S.C. section 360bbb-3(b)(1), unless the authorization is terminated or revoked sooner.  Performed at [facility], [place] [address], [place], [place] [address]     Procedures and diagnostic studies:  No results found.             LOS: 4 days   Johnpatrick Jenny  [facility]   Pager on [email or web address]. If 7PM-7AM, please contact night-coverage at [email or web address]     05/24/2020, 1:51 PM

## 2020-05-24 NOTE — Plan of Care (Signed)
Pt c/o leg an knee pain over night due to fall yesterday. Pt given PRN pain meds. Provider notified, aware. Minimal swelling to right knee but no bruising or redness noted. Pt unable to wear SCDs over night due to leg pain. VSS. Pt had a few missed urine occurrences after receiving lasix, but urine output adequate.  Problem: Education: Goal: Knowledge of General Education information will improve Description: Including pain rating scale, medication(s)/side effects and non-pharmacologic comfort measures Outcome: Progressing   Problem: Health Behavior/Discharge Planning: Goal: Ability to manage health-related needs will improve Outcome: Progressing   Problem: Clinical Measurements: Goal: Ability to maintain clinical measurements within normal limits will improve Outcome: Progressing Goal: Will remain free from infection Outcome: Progressing Goal: Diagnostic test results will improve Outcome: Progressing Goal: Respiratory complications will improve Outcome: Progressing Goal: Cardiovascular complication will be avoided Outcome: Progressing   Problem: Activity: Goal: Risk for activity intolerance will decrease Outcome: Progressing   Problem: Nutrition: Goal: Adequate nutrition will be maintained Outcome: Progressing   Problem: Coping: Goal: Level of anxiety will decrease Outcome: Progressing   Problem: Elimination: Goal: Will not experience complications related to bowel motility Outcome: Progressing Goal: Will not experience complications related to urinary retention Outcome: Progressing   Problem: Pain Managment: Goal: General experience of comfort will improve Outcome: Progressing   Problem: Safety: Goal: Ability to remain free from injury will improve Outcome: Progressing   Problem: Skin Integrity: Goal: Risk for impaired skin integrity will decrease Outcome: Progressing   Problem: Education: Goal: Ability to demonstrate management of disease process will  improve Outcome: Progressing Goal: Ability to verbalize understanding of medication therapies will improve Outcome: Progressing Goal: Individualized Educational Video(s) Outcome: Progressing   Problem: Activity: Goal: Capacity to carry out activities will improve Outcome: Progressing   Problem: Cardiac: Goal: Ability to achieve and maintain adequate cardiopulmonary perfusion will improve Outcome: Progressing

## 2020-05-24 NOTE — Progress Notes (Signed)
Mobility Specialist - Progress Note   05/24/20 1302  Mobility  Activity Contraindicated/medical hold  Mobility performed by Mobility specialist    Pt currently out of room for procedure. Will hold and re-attempt when pt is available and appropriate.     Terrea Bruster Mobility Specialist  05/24/20, 1:02 PM

## 2020-05-25 ENCOUNTER — Encounter: Payer: Self-pay | Admitting: Vascular Surgery

## 2020-05-25 DIAGNOSIS — N184 Chronic kidney disease, stage 4 (severe): Secondary | ICD-10-CM | POA: Diagnosis not present

## 2020-05-25 DIAGNOSIS — D631 Anemia in chronic kidney disease: Secondary | ICD-10-CM | POA: Diagnosis not present

## 2020-05-25 DIAGNOSIS — I2510 Atherosclerotic heart disease of native coronary artery without angina pectoris: Secondary | ICD-10-CM | POA: Diagnosis not present

## 2020-05-25 DIAGNOSIS — I5043 Acute on chronic combined systolic (congestive) and diastolic (congestive) heart failure: Secondary | ICD-10-CM | POA: Diagnosis not present

## 2020-05-25 DIAGNOSIS — E118 Type 2 diabetes mellitus with unspecified complications: Secondary | ICD-10-CM | POA: Diagnosis not present

## 2020-05-25 LAB — BASIC METABOLIC PANEL
Anion gap: 13 (ref 5–15)
BUN: 82 mg/dL — ABNORMAL HIGH (ref 8–23)
CO2: 24 mmol/L (ref 22–32)
Calcium: 8.3 mg/dL — ABNORMAL LOW (ref 8.9–10.3)
Chloride: 98 mmol/L (ref 98–111)
Creatinine, Ser: 3.64 mg/dL — ABNORMAL HIGH (ref 0.61–1.24)
GFR, Estimated: 18 mL/min — ABNORMAL LOW (ref >60)
Glucose, Bld: 232 mg/dL — ABNORMAL HIGH (ref 70–99)
Potassium: 3.7 mmol/L (ref 3.5–5.1)
Sodium: 135 mmol/L (ref 135–145)

## 2020-05-25 LAB — GLUCOSE, CAPILLARY
Glucose-Capillary: 205 mg/dL — ABNORMAL HIGH (ref 70–99)
Glucose-Capillary: 145 mg/dL — ABNORMAL HIGH (ref 70–99)
Glucose-Capillary: 213 mg/dL — ABNORMAL HIGH (ref 70–99)
Glucose-Capillary: 130 mg/dL — ABNORMAL HIGH (ref 70–99)

## 2020-05-25 NOTE — Progress Notes (Addendum)
PROGRESS NOTE    [name] [name]   WRU:[id number]  DOB: [birth year]  PCP: [name], MD    DOA: 05/20/2020 LOS: 5   Brief Narrative   [name] [name] is a 64 y.o. male with medical history significant for coronary artery disease status post three-vessel CABG, status post recent PCI with stent angioplasty of the ostial right coronary artery, history of diabetes mellitus with stage IV chronic kidney disease, severe AS and hypertension who presented as an outpatient for creation of an AV fistula (brachiocephalic) in preparation for initiation of renal replacement therapy.  He was given Versed for sedation.  Treated procedure and he developed acute respiratory distress requiring noninvasive mechanical ventilation.  He was also hypoglycemic with initial blood glucose levels in the 40s.  This was treated with IV dextrose.  Admitted for acute on chronic diastolic CHF with pulmonary edema, undergoing IV diuresis.  Cardiology is following.  Patient reported 6-week history of black stools and noted to have severe anemia due to chronic GI blood loss.  3 units PRBCs have been transfused this admission.  GI was consulted, but due to patient's high risk for cardiovascular complications, no endoscopic evaluation has been pursued at this time.  Patient's hemoglobin has since stabilized.  Given CKD stage IV, nephrology and vascular surgery are following as well.  Left brachiocephalic AV fistula was placed on 05/24/2020.     Assessment & Plan   Principal Problem:   Acute on chronic combined systolic and diastolic CHF (congestive heart failure) (HCC) Active Problems:   DM (diabetes mellitus), type 2 with complications (HCC)   CAD (coronary artery disease)   CKD (chronic kidney disease), stage IV (HCC)   Aortic stenosis   Anemia in chronic kidney disease   Acute on chronic combined systolic (congestive) and diastolic (congestive) heart failure (HCC)   Acute on chronic combined  systolic/diastolic CHF /ischemic [id number] echo in August 2021 showed EF 30 to 78%, grade 2 diastolic dysfunction, with mitral and aortic valvular disease.  Cardiology is following. --Continue Lasix 20 mg IV twice daily, per cardiology --Strict I/O's & Daily weights --  Severe mitral regurgitation Severe aortic stenosis -patient being evaluated for possible TAVR in the next few months  History of CAD s/p CABG, PCI to RCA 03/2020 -continue Plavix, Crestor, Coreg, Imdur  CKD stage IV -nephrology following.  Vascular surgery consulted, placed left AV fistula on 05/24/2020. Expect fistula will take about 4 weeks to mature.  Follow-up in vascular clinic in 1 month.   Will require PermCath if dialysis required prior to fistula maturing.  Monitor renal function.  GI bleeding -appears resolved.  GI unable to perform endoscopic evaluation due to high cardiovascular risk.  Prior colonoscopy in October 2016 showed few polyps that were resected.  EGD on 04/11/2020 - for any source of bleeding, showed hiatal hernia.  Acute on chronic blood loss anemia, iron deficiency anemia - s/p transfusion of 3 units PRBCs this admission (2 on 10/31, 1 on 10/29).    Monitor CBC.  Transfuse if hemoglobin left 7.0.  Mild thrombocytopenia -improved, platelets 147k.  Monitor CBC  Insulin-dependent type 2 diabetes, episode of hypoglycemia -insulin NPH at home which is held.  Sliding scale NovoLog.  Hypoglycemia protocol.  Peripheral neuropathy -previously has tried gabapentin and Lyrica but states did not like them.  BPH -continue finasteride and Flomax  OSA -CPAP ordered  Depression/anxiety -continue Zoloft, BuSpar  Muscle spasms -continue Flexeril as needed   Patient BMI: Body mass index is 35.93  kg/m.   DVT prophylaxis: Place and maintain sequential compression device Start: 05/23/20 1444 SCDs Start: 05/20/20 0946   Diet:  Diet Orders (From admission, onward)    Start     Ordered   05/24/20 1650   Diet renal/carb modified with fluid restriction Diet-HS Snack? Nothing; Fluid restriction: 1200 mL Fluid; Room service appropriate? Yes; Fluid consistency: Thin  Diet effective now       Question Answer Comment  Diet-HS Snack? Nothing   Fluid restriction: 1200 mL Fluid   Room service appropriate? Yes   Fluid consistency: Thin      05/24/20 1649            Code Status: Full Code    Subjective 05/25/20    Patient seen this morning at bedside.  He is upset because he wants to go home and did not clearly understand cardiology's plan to continue IV diuresis this morning.  He is frustrated in the hospital due to poor sleep and inability to get comfortable, wants to take a shower.  He ultimately did agree to stay until tomorrow but says he will go home tomorrow regardless.  Mentions charley horses in both legs.  Denies chest pain, shortness of breath, nausea vomiting or other acute complaints at this time.  Disposition Plan & Communication   Status is: Inpatient  Remains inpatient appropriate because:IV treatments appropriate due to intensity of illness or inability to take PO.  Undergoing IV diuresis.   Dispo: The patient is from: Home              Anticipated d/c is to: Home              Anticipated d/c date is: 2 days              Patient currently is not medically stable to d/c.        Family Communication: None at bedside   Consults, Procedures, Significant Events   Consultants:   Cardiology  Gastroenterology  Nephrology  Vascular surgery  TOC  Procedures:   AV fistula placement 05/24/2020  Antimicrobials:  Anti-infectives (From admission, onward)   Start     Dose/Rate Route Frequency Ordered Stop   05/24/20 1447  clindamycin (CLEOCIN) 600 MG/50ML IVPB       Note to Pharmacy: Register, Karen   : cabinet override      05/24/20 1447 05/24/20 1508   05/24/20 0600  clindamycin (CLEOCIN) IVPB 600 mg        600 mg 100 mL/hr over 30 Minutes Intravenous On call  to O.R. 05/23/20 2200 05/24/20 1500   05/20/20 0630  clindamycin (CLEOCIN) IVPB 300 mg  Status:  Discontinued        300 mg 100 mL/hr over 30 Minutes Intravenous On call to O.R. 05/20/20 0621 05/20/20 1500         Objective   Vitals:   05/24/20 2009 05/25/20 0423 05/25/20 0820 05/25/20 1200  BP: 100/80 135/84 (!) 130/95 128/76  Pulse: 97 95 95 90  Resp:   18 17  Temp: (!) 97.5 F (36.4 C) 97.8 F (36.6 C) 97.8 F (36.6 C) 97.6 F (36.4 C)  TempSrc: Oral Oral    SpO2: 95% 95% 98% 100%  Weight:  101 kg    Height:        Intake/Output Summary (Last 24 hours) at 05/25/2020 1521 Last data filed at 05/25/2020 0950 Gross per 24 hour  Intake 440 ml  Output 1105 ml  Net -665 ml  Filed Weights   05/23/20 0630 05/24/20 0422 05/25/20 0423  Weight: 103.4 kg 102.6 kg 101 kg    Physical Exam:  General exam: awake, alert, no acute distress Respiratory system: CTAB, no wheezes, rales or rhonchi, normal respiratory effort. Cardiovascular system: normal S1/S2, RRR, 1-2+ bilateral lower extremity edema.   Central nervous system: A&O x3. no gross focal neurologic deficits, normal speech Extremities: moves all, no cyanosis, normal tone, new left upper extremity AV fistula with appropriate surrounding ecchymosis Skin: dry, intact, normal temperature  Labs   Data Reviewed: I have personally reviewed following labs and imaging studies  CBC: Recent Labs  Lab 05/22/20 1119 05/23/20 0429 05/24/20 0617  WBC 6.2 6.8 7.1  NEUTROABS 4.2 4.3 4.7  HGB 5.9* 8.5* 9.0*  HCT 18.9* 25.8* 27.8*  MCV 94.0 90.8 90.8  PLT 121* 128* 992*   Basic Metabolic Panel: Recent Labs  Lab 05/21/20 0611 05/22/20 0517 05/23/20 0429 05/24/20 0617 05/25/20 0633  NA 135 136 135 135 135  K 3.9 4.0 3.6 3.9 3.7  CL 102 100 98 96* 98  CO2 22 24 26 24 24   GLUCOSE 153* 203* 140* 151* 232*  BUN 71* 78* 81* 83* 82*  CREATININE 3.79* 3.94* 3.78* 3.84* 3.64*  CALCIUM 7.9* 7.9* 7.7* 8.2* 8.3*  MG  --   --    --  2.2  --    GFR: Estimated Creatinine Clearance: 22.2 mL/min (A) (by C-G formula based on SCr of 3.64 mg/dL (H)). Liver Function Tests: No results for input(s): AST, ALT, ALKPHOS, BILITOT, PROT, ALBUMIN in the last 168 hours. No results for input(s): LIPASE, AMYLASE in the last 168 hours. No results for input(s): AMMONIA in the last 168 hours. Coagulation Profile: Recent Labs  Lab 05/24/20 0617  INR 1.0   Cardiac Enzymes: No results for input(s): CKTOTAL, CKMB, CKMBINDEX, TROPONINI in the last 168 hours. BNP (last 3 results) No results for input(s): PROBNP in the last 8760 hours. HbA1C: No results for input(s): HGBA1C in the last 72 hours. CBG: Recent Labs  Lab 05/24/20 1548 05/24/20 1655 05/24/20 2036 05/25/20 0818 05/25/20 1139  GLUCAP 119* 124* 144* 205* 145*   Lipid Profile: No results for input(s): CHOL, HDL, LDLCALC, TRIG, CHOLHDL, LDLDIRECT in the last 72 hours. Thyroid Function Tests: No results for input(s): TSH, T4TOTAL, FREET4, T3FREE, THYROIDAB in the last 72 hours. Anemia Panel: No results for input(s): VITAMINB12, FOLATE, FERRITIN, TIBC, IRON, RETICCTPCT in the last 72 hours. Sepsis Labs: No results for input(s): PROCALCITON, LATICACIDVEN in the last 168 hours.  Recent Results (from the past 240 hour(s))  SARS CORONAVIRUS 2 (TAT 6-24 HRS) Nasopharyngeal Nasopharyngeal Swab     Status: None   Collection Time: 05/18/20 11:54 AM   Specimen: Nasopharyngeal Swab  Result Value Ref Range Status   SARS Coronavirus 2 NEGATIVE NEGATIVE Final    Comment: (NOTE) SARS-CoV-2 target nucleic acids are NOT DETECTED.  The SARS-CoV-2 RNA is generally detectable in upper and lower respiratory specimens during the acute phase of infection. Negative results do not preclude SARS-CoV-2 infection, do not rule out co-infections with other pathogens, and should not be used as the sole basis for treatment or other patient management decisions. Negative results must be  combined with clinical observations, patient history, and epidemiological information. The expected result is Negative.  Fact Sheet for Patients: SugarRoll.be  Fact Sheet for Healthcare Providers: https://www.woods-mathews.com/  This test is not yet approved or cleared by the [place] FDA and  has been authorized for detection  and/or diagnosis of SARS-CoV-2 by FDA under an Emergency Use Authorization (EUA). This EUA will remain  in effect (meaning this test can be used) for the duration of the COVID-19 declaration under Se ction 564(b)(1) of the Act, 21 U.S.C. section 360bbb-3(b)(1), unless the authorization is terminated or revoked sooner.  Performed at [facility], [place] [address], [place], [place] [address]       Imaging Studies   [place] OR NERVE BLOCK-IMAGE ONLY [facility])  Result Date: 05/24/2020 There is no interpretation for this exam.  This order is for images obtained during a surgical procedure.  Please See "Surgeries" Tab for more information regarding the procedure.     Medications   Scheduled Meds: . B-complex with vitamin C  1 tablet Oral Daily  . busPIRone  20 mg Oral BID  . calcitRIOL  0.25 mcg Oral Q M,W,F  . carvedilol  3.125 mg Oral BID WC  . clopidogrel  75 mg Oral Daily  . feeding supplement  237 mL Oral Q24H  . finasteride  5 mg Oral Daily  . insulin aspart  0-5 Units Subcutaneous QHS  . insulin aspart  0-9 Units Subcutaneous TID WC  . isosorbide mononitrate  30 mg Oral Daily  . rosuvastatin  10 mg Oral Daily  . sertraline  100 mg Oral Daily  . sodium chloride flush  3 mL Intravenous Q12H  . tamsulosin  0.4 mg Oral Daily   Continuous Infusions: . sodium chloride    . furosemide 120 mg (05/25/20 0854)       LOS: 5 days    Time spent: 35 minutes with greater than 50% spent in coordination of care and direct patient contact.    Ezekiel Slocumb, DO [facility]  05/25/2020, 3:21 PM     If 7PM-7AM, please contact night-coverage. How to contact the [facility] Attending or Consulting provider [place] or covering provider during after hours [place], for this patient?    1. Check the care team in [facility] and look for a) attending/consulting [facility] provider listed and b) the [facility] team listed 2. Log into [email or web address] and use [place]'s universal password to access. If you do not have the password, please contact the hospital operator. 3. Locate the [facility] provider you are looking for under [facility] and page to a number that you can be directly reached. 4. If you still have difficulty reaching the provider, please page the [facility] (Director on Call) for the Hospitalists listed on amion for assistance.

## 2020-05-25 NOTE — Progress Notes (Signed)
[facility] Daily Progress Note  Subjective: 05/24/20: Left brachiocephalic arteriovenous fistula placement  Patient without complaint.  No issues overnight.  Denies any left upper extremity pain or swelling.  Objective: Vitals:   05/24/20 2009 05/25/20 0423 05/25/20 0820 05/25/20 1200  BP: 100/80 135/84 (!) 130/95 128/76  Pulse: 97 95 95 90  Resp:   18 17  Temp: (!) 97.5 F (36.4 C) 97.8 F (36.6 C) 97.8 F (36.6 C) 97.6 F (36.4 C)  TempSrc: Oral Oral    SpO2: 95% 95% 98% 100%  Weight:  101 kg    Height:        Intake/Output Summary (Last 24 hours) at 05/25/2020 1249 Last data filed at 05/25/2020 0950 Gross per 24 hour  Intake 490 ml  Output 1455 ml  Net -965 ml   Physical Exam: A&Ox3, NAD CV: RRR Pulmonary: CTA Bilaterally Abdomen: Soft, Nontender, Nondistended Vascular:  Left upper extremity: Extremity is soft. Warm distally to fingers.  2+ radial pulse.5 incision is clean and dry. Dermabond intact. Some ecchymosis surrounding the incision.   Laboratory: CBC    Component Value Date/Time   WBC 7.1 05/24/2020 0617   HGB 9.0 (L) 05/24/2020 0617   HGB 12.1 (L) 10/14/2013 2006   HCT 27.8 (L) 05/24/2020 0617   HCT 36.9 (L) 10/14/2013 2006   PLT 147 (L) 05/24/2020 0617   PLT 178 10/14/2013 2006   BMET    Component Value Date/Time   NA 135 05/25/2020 0633   NA 138 05/04/2014 0816   K 3.7 05/25/2020 0633   K 4.1 05/04/2014 0816   CL 98 05/25/2020 0633   CL 101 05/04/2014 0816   CO2 24 05/25/2020 0633   CO2 29 05/04/2014 0816   GLUCOSE 232 (H) 05/25/2020 0633   GLUCOSE 195 (H) 05/04/2014 0816   BUN 82 (H) 05/25/2020 0633   BUN 24 (H) 05/04/2014 0816   CREATININE 3.64 (H) 05/25/2020 0633   CREATININE 1.78 (H) 05/04/2014 0816   CALCIUM 8.3 (L) 05/25/2020 0633   CALCIUM 9.3 05/04/2014 0816   GFRNONAA 18 (L) 05/25/2020 0633   GFRNONAA 42 (L) 05/04/2014 0816   GFRNONAA 46 (L) 10/14/2013 2006   GFRAA 18 (L) 04/12/2020 0520   GFRAA 50  (L) 05/04/2014 0816   GFRAA 54 (L) 10/14/2013 2006   Assessment/Planning: The patient is a 64 year old male with multiple medical issues including acute on chronic kidney disease  1) Acute on chronic kidney disease: Brachiocephalic fistula created on May 24, 2020 Fistula need approximately 4 weeks to mature We will see the patient in our office in 1 month to assess incision site as well as undergo an HDA to assess patency of the graft.  At that time, if the fistula is mature and can be cannulated for dialysis. If the patient needs dialysis before the fistula is mature he would need a PermCath.  Discussed with Dr. Ellis Parents Kriss Perleberg PA-C 05/25/2020 12:49 PM

## 2020-05-25 NOTE — Progress Notes (Signed)
[facility]  ROUNDING NOTE   Subjective:   Patient resting in bed, in no acute distress. He has a new brachiocephalic AVF placed by the vascular team yesterday.Site with derma bond, appears clean and dry.  Objective:  Vital signs in last 24 hours:  Temp:  [96.9 F (36.1 C)-98 F (36.7 C)] 97.7 F (36.5 C) (11/03 1537) Pulse Rate:  [81-97] 97 (11/03 1537) Resp:  [16-18] 18 (11/03 1537) BP: (100-135)/(66-95) 129/80 (11/03 1537) SpO2:  [91 %-100 %] 97 % (11/03 1537) Weight:  [101 kg] 101 kg (11/03 0423)  Weight change: -1.633 kg Filed Weights   05/23/20 0630 05/24/20 0422 05/25/20 0423  Weight: 103.4 kg 102.6 kg 101 kg    Intake/Output: I/O last 3 completed shifts: In: 54 [P.O.:240; I.V.:200; IV Piggyback:50] Out: 4132 [Urine:3025; Blood:5]   Intake/Output this shift:  No intake/output data recorded.  Physical Exam: General: Resting in bed, in no acute distress  Head:  Normocephalic,Atraumatic  Eyes: Anicteric  Neck: Supple, trachea midline  Lungs:  Lungs with fine crackles, respirations even,unlabored  Heart: S1S2,no rubs or gallops  Abdomen:  Soft, nontender, nontender  Extremities:  2+ peripheral edema.  Neurologic: Speech clear and appropriate,Oriented x 3  Skin: No acute lesions or rashes  Access LUA new AVF placed on 44/07/270    Basic Metabolic Panel: Recent Labs  Lab 05/21/20 0611 05/21/20 0611 05/22/20 0517 05/22/20 0517 05/23/20 0429 05/24/20 0617 05/25/20 0633  NA 135  --  136  --  135 135 135  K 3.9  --  4.0  --  3.6 3.9 3.7  CL 102  --  100  --  98 96* 98  CO2 22  --  24  --  26 24 24   GLUCOSE 153*  --  203*  --  140* 151* 232*  BUN 71*  --  78*  --  81* 83* 82*  CREATININE 3.79*  --  3.94*  --  3.78* 3.84* 3.64*  CALCIUM 7.9*   < > 7.9*   < > 7.7* 8.2* 8.3*  MG  --   --   --   --   --  2.2  --    < > = values in this interval not displayed.    Liver Function Tests: No results for input(s): AST, ALT, ALKPHOS, BILITOT,  PROT, ALBUMIN in the last 168 hours. No results for input(s): LIPASE, AMYLASE in the last 168 hours. No results for input(s): AMMONIA in the last 168 hours.  CBC: Recent Labs  Lab 05/22/20 1119 05/23/20 0429 05/24/20 0617  WBC 6.2 6.8 7.1  NEUTROABS 4.2 4.3 4.7  HGB 5.9* 8.5* 9.0*  HCT 18.9* 25.8* 27.8*  MCV 94.0 90.8 90.8  PLT 121* 128* 147*    Cardiac Enzymes: No results for input(s): CKTOTAL, CKMB, CKMBINDEX, TROPONINI in the last 168 hours.  BNP: Invalid input(s): POCBNP  CBG: Recent Labs  Lab 05/24/20 1548 05/24/20 1655 05/24/20 2036 05/25/20 0818 05/25/20 1139  GLUCAP 119* 124* 144* 205* 145*    Microbiology: Results for orders placed or performed during the hospital encounter of 05/18/20  SARS CORONAVIRUS 2 (TAT 6-24 HRS) Nasopharyngeal Nasopharyngeal Swab     Status: None   Collection Time: 05/18/20 11:54 AM   Specimen: Nasopharyngeal Swab  Result Value Ref Range Status   SARS Coronavirus 2 NEGATIVE NEGATIVE Final    Comment: (NOTE) SARS-CoV-2 target nucleic acids are NOT DETECTED.  The SARS-CoV-2 RNA is generally detectable in upper and lower respiratory specimens during  the acute phase of infection. Negative results do not preclude SARS-CoV-2 infection, do not rule out co-infections with other pathogens, and should not be used as the sole basis for treatment or other patient management decisions. Negative results must be combined with clinical observations, patient history, and epidemiological information. The expected result is Negative.  Fact Sheet for Patients: SugarRoll.be  Fact Sheet for Healthcare Providers: https://www.woods-mathews.com/  This test is not yet approved or cleared by the [place] FDA and  has been authorized for detection and/or diagnosis of SARS-CoV-2 by FDA under an Emergency Use Authorization (EUA). This EUA will remain  in effect (meaning this test can be used) for the  duration of the COVID-19 declaration under Se ction 564(b)(1) of the Act, 21 U.S.C. section 360bbb-3(b)(1), unless the authorization is terminated or revoked sooner.  Performed at [facility], [place] [address] [place], [place] [address]    *Note: Due to a large number of results and/or encounters for the requested time period, some results have not been displayed. A complete set of results can be found in Results Review.    Coagulation Studies: Recent Labs    05/24/20 0617  LABPROT 13.2  INR 1.0    Urinalysis: No results for input(s): COLORURINE, LABSPEC, PHURINE, GLUCOSEU, HGBUR, BILIRUBINUR, KETONESUR, PROTEINUR, UROBILINOGEN, NITRITE, LEUKOCYTESUR in the last 72 hours.  Invalid input(s): APPERANCEUR    Imaging: [place] OR NERVE BLOCK-IMAGE ONLY [facility])  Result Date: 05/24/2020 There is no interpretation for this exam.  This order is for images obtained during a surgical procedure.  Please See "Surgeries" Tab for more information regarding the procedure.     Medications:   . sodium chloride    . furosemide 120 mg (05/25/20 0854)   . B-complex with vitamin C  1 tablet Oral Daily  . busPIRone  20 mg Oral BID  . calcitRIOL  0.25 mcg Oral Q M,W,F  . carvedilol  3.125 mg Oral BID WC  . clopidogrel  75 mg Oral Daily  . feeding supplement  237 mL Oral Q24H  . finasteride  5 mg Oral Daily  . insulin aspart  0-5 Units Subcutaneous QHS  . insulin aspart  0-9 Units Subcutaneous TID WC  . isosorbide mononitrate  30 mg Oral Daily  . rosuvastatin  10 mg Oral Daily  . sertraline  100 mg Oral Daily  . sodium chloride flush  3 mL Intravenous Q12H  . tamsulosin  0.4 mg Oral Daily   sodium chloride, acetaminophen, albuterol, cyclobenzaprine, fentaNYL, midazolam, nitroGLYCERIN, ondansetron (ZOFRAN) IV, ondansetron (ZOFRAN) IV, oxyCODONE, polyvinyl alcohol, sodium chloride flush  Assessment/ Plan:  [name] is a 64 y.o.  male with a history of PVD, Hypertension,  Depression, severe aortic stenosis, obesity, DM type II, COPD, CAD with stent placement,  anemia and CKD stage IV who is admitted for Acute on chronic combined systolic and diastolic CHF.   1. Chronic Kidney disease stage IV Lab Results  Component Value Date   CREATININE 3.64 (H) 05/25/2020   CREATININE 3.84 (H) 05/24/2020   CREATININE 3.78 (H) 05/23/2020  Total urine output for the preceding 24 hours is 1,750 ml  New Brachiocephalic fistula placed yesterday by Dr.Dew Will need 4 weeks to mature May need to consider Permcath if patient needs dialysis before it No acute indication for dialysis now Patient is on Furosemide 120 mg IV BID for volume overload  2. Anemia of CKD Hemoglobin 9.0 on 05/24/2020 Will continue monitoring CBC s   3. Hypertension  Normotensive today  Continue current antihypertensive regimen  4. Secondary hyperparathyroidism  Calcium 8.3 Phosphorus 5.0 on 03/24/2020 Will continue monitoring labs   LOS: 5 Alexsis Branscom 11/3/20213:54 PM

## 2020-05-25 NOTE — Hospital Course (Addendum)
[name] [name] is a 64 y.o. male with medical history significant for coronary artery disease status post three-vessel CABG, status post recent PCI with stent angioplasty of the ostial right coronary artery, history of diabetes mellitus with stage IV chronic kidney disease, severe AS and hypertension who presented as an outpatient for creation of an AV fistula (brachiocephalic) in preparation for initiation of renal replacement therapy.  He was given Versed for sedation.  Treated procedure   he developed acute respiratory distress requiring noninvasive mechanical ventilation.  He was also hypoglycemic with initial blood glucose levels in the 40s.  This was treated with IV dextrose.  Admitted for acute on chronic diastolic CHF with pulmonary edema, undergoing IV diuresis.   Patient reported 6-week history of black stools and noted to have severe anemia due to chronic GI blood loss.  3 units PRBCs have been transfused this admission.  GI was consulted, but due to patient's high risk for cardiovascular complications, no endoscopic evaluation has been pursued at this time.  Patient's hemoglobin has since stabilized.  Given CKD stage IV, nephrology and vascular surgery are following as well.  Left brachiocephalic AV fistula was placed on 05/24/2020.

## 2020-05-25 NOTE — Progress Notes (Signed)
Progress Note   Patient Name: [name] [name] Date of Encounter: 05/25/2020  Primary Cardiologist: [name], MD   Subjective   No chest pain.   Continues to report improved breathing.  On BiPAP, napping.  States his L arm is very sore. S/p AV fistula placement 05/24/20.  He wants to go home.  Inpatient Medications    Scheduled Meds: . B-complex with vitamin C  1 tablet Oral Daily  . busPIRone  20 mg Oral BID  . calcitRIOL  0.25 mcg Oral Q M,W,F  . carvedilol  3.125 mg Oral BID WC  . clopidogrel  75 mg Oral Daily  . feeding supplement  237 mL Oral Q24H  . finasteride  5 mg Oral Daily  . insulin aspart  0-5 Units Subcutaneous QHS  . insulin aspart  0-9 Units Subcutaneous TID WC  . isosorbide mononitrate  30 mg Oral Daily  . rosuvastatin  10 mg Oral Daily  . sertraline  100 mg Oral Daily  . sodium chloride flush  3 mL Intravenous Q12H  . tamsulosin  0.4 mg Oral Daily   Continuous Infusions: . sodium chloride    . furosemide 120 mg (05/25/20 0854)   PRN Meds: sodium chloride, acetaminophen, albuterol, cyclobenzaprine, fentaNYL, midazolam, nitroGLYCERIN, ondansetron (ZOFRAN) IV, ondansetron (ZOFRAN) IV, oxyCODONE, polyvinyl alcohol, sodium chloride flush   Vital Signs    Vitals:   05/24/20 1656 05/24/20 2009 05/25/20 0423 05/25/20 0820  BP: 117/69 100/80 135/84 (!) 130/95  Pulse: 84 97 95 95  Resp: 17   18  Temp: 98 F (36.7 C) (!) 97.5 F (36.4 C) 97.8 F (36.6 C) 97.8 F (36.6 C)  TempSrc:  Oral Oral   SpO2: 99% 95% 95% 98%  Weight:   101 kg   Height:        Intake/Output Summary (Last 24 hours) at 05/25/2020 0946 Last data filed at 05/25/2020 0820 Gross per 24 hour  Intake 490 ml  Output 1755 ml  Net -1265 ml   Last 3 Weights 05/25/2020 05/24/2020 05/23/2020  Weight (lbs) 222 lb 9.6 oz 226 lb 3.2 oz 228 lb  Weight (kg) 100.971 kg 102.604 kg 103.42 kg  Some encounter information is confidential and restricted. Go to Review Flowsheets activity to  see all data.      Telemetry    NSR, 80s-90s- Personally Reviewed  ECG    No new tracings- Personally Reviewed  Physical Exam   GEN: No acute distress.  Napping.  On the BiPAP. Neck:  JVD difficult to assess due to body habitus and BiPAP use. Cardiac: RRR, 3/6 systolic murmur appreciated throughout the cardiac exam.  No rubs, or gallops.  Respiratory: Coarse breath sounds on the BiPAP. GI: Soft, nontender, non-distended  MS:  Mild b/l edema (R>L); No deformity. Neuro:  Nonfocal  Psych: Normal affect   Labs    High Sensitivity Troponin:   Recent Labs  Lab 05/20/20 1509 05/20/20 1727  TROPONINIHS 70* 72*      Chemistry Recent Labs  Lab 05/23/20 0429 05/24/20 0617 05/25/20 0633  NA 135 135 135  K 3.6 3.9 3.7  CL 98 96* 98  CO2 26 24 24   GLUCOSE 140* 151* 232*  BUN 81* 83* 82*  CREATININE 3.78* 3.84* 3.64*  CALCIUM 7.7* 8.2* 8.3*  GFRNONAA 17* 17* 18*  ANIONGAP 11 15 13      Hematology Recent Labs  Lab 05/22/20 1119 05/23/20 0429 05/24/20 0617  WBC 6.2 6.8 7.1  RBC 2.01* 2.84* 3.06*  HGB 5.9* 8.5* 9.0*  HCT 18.9* 25.8* 27.8*  MCV 94.0 90.8 90.8  MCH 29.4 29.9 29.4  MCHC 31.2 32.9 32.4  RDW 17.0* 17.8* 17.3*  PLT 121* 128* 147*    BNPNo results for input(s): BNP, PROBNP in the last 168 hours.   DDimer No results for input(s): DDIMER in the last 168 hours.   Radiology    [place] OR NERVE BLOCK-IMAGE ONLY [facility])  Result Date: 05/24/2020 There is no interpretation for this exam.  This order is for images obtained during a surgical procedure.  Please See "Surgeries" Tab for more information regarding the procedure.    Cardiac Studies   Echo 03/22/2020 1. Left ventricular ejection fraction, by estimation, is 30-35%. The left  ventricle has moderately decreased function. The left ventricle  demonstrates regional wall motion abnormalities, basal to mid inferior and  inferoseptal akinesis, basal to mid  inferolateral severe hypokinesis. The left  ventricular internal cavity  size was mildly dilated. There is mild left ventricular hypertrophy. Left  ventricular diastolic parameters are consistent with Grade II diastolic  dysfunction (pseudonormalization).  2. Right ventricular systolic function is moderately reduced. The right  ventricular size is normal. There is mildly elevated pulmonary artery  systolic pressure. The estimated right ventricular systolic pressure is  19.3 mmHg.  3. Left atrial size was moderately dilated.  4. The mitral valve is degenerative. Moderate to severe mitral valve  regurgitation. No evidence of mitral stenosis.  5. The aortic valve is tricuspid. Aortic valve regurgitation is mild.  Moderate aortic valve stenosis. Aortic valve area, by VTI measures 1.08  cm. Aortic valve mean gradient measures 22.0 mmHg.  6. Aortic dilatation noted. There is mild dilatation of the ascending  aorta measuring 42 mm.  7. The inferior vena cava is dilated in size with >50% respiratory  variability, suggesting right atrial pressure of 8 mmHg.   Cardiac Cath 03/23/2020  Ost RCA to Prox RCA lesion is 99% stenosed.  Post intervention, there is a 0% residual stenosis.  A drug-eluting stent was successfully placed using a STENT   Patient Profile     64 y.o. male with hx of HFrEF (EF 30 to 35%, 02/2020), CAD s/p CABG for three-vessel CAD (2012), aortic stenosis, mitral regurgitation, left-sided carotid endarterectomy, PAD followed by VVS, hypertension, hyperlipidemia, tobacco use, DM2, obesity, psoriasis, CKD/ESRD with plan to insert access for hemodialysis today 10/29, and who is being seen today for the evaluation of acute exacerbation of systolic heart failure.  Assessment & Plan    Acute on chronic HFrEF --Reports continued improvement of shortness of breath.   --H/O LVSF at 30 to 35%, PAH, and elevated right heart pressures.   --IV Lasix increased to 120 mg twice daily.   --Net -1.27L yesterday with weight  102.6kg  101.0kg.   --Diuresis output has been limited by CKD, requiring high dose IV lasix. --Per MD, will hold off on addition of metolazone today. --Daily BMET. Cr 3.84  3.64. --Nephrology and VVS following. --Replete electrolytes with K goal 4.0, Mg goal 2.0. --Given suboptimal BP and elevated rates, recommend consider increased carvedilol 6.25 mg for more optimal BP/heart rate control. --No ACE/ARB/Arni/MRA secondary to CKD.   --Continue BiPAP as needed for breathing support.  Hypokalemia --Replete with goal 4.0. Daily BMET.   Severe aortic stenosis --Recent echo as above with aortic valve area 1.08cm and aortic valve mean gradient 22.0 mmHg.  Currently undergoing OP evaluation for TAVR by structural heart team with most recent CT showing that  showed severe calcification of trileaflet aortic valve and suitable anatomy for TAVR.  Given his severe valvular disease, he is unable to tolerate large shifts in fluid. Caution with administration of IVF/transfusions.  Recommend heart rate and BP control.  As above, consider increasing carvedilol to 6.25 mg for optimal BP and heart rate control.  Severe anemia, acute blood loss anemia --Will order CBC today.    --Was seen by GI with no endoscopic procedures recommended.  He has suffered from acute blood loss anemia, which was thought to possibly be due to AV malformation in the setting of severe aortic stenosis. --Transfusion as needed to maintain hemoglobin above 8.0.   --As above, he is unable to tolerate large shifts in fluid with recommendation for caution during any transfusion.    CAD s/p CABG in 2012 s/p recent PCI/DES to the RCA 03/23/2020 --No reported angina.  ASA held given hemoglobin.  Continue Plavix 75 mg, given his recent stenting.  Restart ASA once felt safe to do so from a bleeding standpoint.  Continue rosuvastatin 10 mg and Imdur 30 mg daily.  Recommend increased dose beta-blocker to carvedilol 6.25 mg twice daily for further  BP control.  Aggressive risk factor modification.  ESRD --Followed by nephrology.  S/p AV fistula placement.  HTN --BP above goal at 130/95.  Recommend optimal BP/HR control in the setting of aortic valve disease. --Given elevated HR, recommend increasing carvedilol to the 6.25 mg twice daily.  BP will also likely improve with diuresis.  HLD  -Continue Crestor 10 mg.   New York Heart Association (NYHA) Functional Class NYHA Class IV    For questions or updates, please contact [facility] Please consult [email or web address] for contact info under        Signed, Arvil Chaco, PA-C  05/25/2020, 9:46 AM

## 2020-05-25 NOTE — Progress Notes (Signed)
Mobility Specialist - Progress Note   05/25/20 1151  Mobility  Activity Ambulated in room  Level of Assistance Modified independent, requires aide device or extra time  Assistive Device None (uses a cane or rollator at home)  Distance Ambulated (ft) 20 ft  Mobility Response Tolerated well  Mobility performed by Mobility specialist  $Mobility charge 1 Mobility    Pt sitting EOB w/ NT present in room upon arrival. Pt initially refused session. States he wants to "get out of this place", and that he's "pissed". Furthermore, pt states he was having "10/10 pain in L arm and in both legs". Mobility specialist shows understanding, but educates pt on the importance of his safety when he dc. Mobility specialist requests pt show her his mobility status. Pt S2S independently. Pt states he "usually stand here until I have my balance". Pt proceeded to ambulate 20' total in room mod. Independently w/o AD. SBA for safety. States he usually uses his cane or rollator when ambulating in his house, and uses a WC when leaving his house. No LOB noted. O2 sat > 98%. Pt on RA. HR sitting in the 90s during ambulation. Overall, pt tolerated session well. Pt apologized for his behavior at the beginning of session. Pt left sitting EOB w/ all needs placed in reach. Nurse was notified.      Marjo Grosvenor Mobility Specialist  05/25/20, 11:58 AM

## 2020-05-26 ENCOUNTER — Telehealth: Payer: Self-pay

## 2020-05-26 DIAGNOSIS — I5043 Acute on chronic combined systolic (congestive) and diastolic (congestive) heart failure: Secondary | ICD-10-CM | POA: Diagnosis not present

## 2020-05-26 DIAGNOSIS — I25118 Atherosclerotic heart disease of native coronary artery with other forms of angina pectoris: Secondary | ICD-10-CM

## 2020-05-26 DIAGNOSIS — I770 Arteriovenous fistula, acquired: Secondary | ICD-10-CM

## 2020-05-26 DIAGNOSIS — I350 Nonrheumatic aortic (valve) stenosis: Secondary | ICD-10-CM | POA: Diagnosis not present

## 2020-05-26 DIAGNOSIS — E118 Type 2 diabetes mellitus with unspecified complications: Secondary | ICD-10-CM

## 2020-05-26 DIAGNOSIS — N184 Chronic kidney disease, stage 4 (severe): Secondary | ICD-10-CM | POA: Diagnosis not present

## 2020-05-26 LAB — GLUCOSE, CAPILLARY: Glucose-Capillary: 152 mg/dL — ABNORMAL HIGH (ref 70–99)

## 2020-05-26 LAB — BASIC METABOLIC PANEL
Anion gap: 12 (ref 5–15)
BUN: 87 mg/dL — ABNORMAL HIGH (ref 8–23)
CO2: 25 mmol/L (ref 22–32)
Calcium: 8.2 mg/dL — ABNORMAL LOW (ref 8.9–10.3)
Chloride: 96 mmol/L — ABNORMAL LOW (ref 98–111)
Creatinine, Ser: 3.66 mg/dL — ABNORMAL HIGH (ref 0.61–1.24)
GFR, Estimated: 17 mL/min — ABNORMAL LOW (ref >60)
Glucose, Bld: 161 mg/dL — ABNORMAL HIGH (ref 70–99)
Potassium: 3.5 mmol/L (ref 3.5–5.1)
Sodium: 133 mmol/L — ABNORMAL LOW (ref 135–145)

## 2020-05-26 LAB — MAGNESIUM: Magnesium: 2.5 mg/dL — ABNORMAL HIGH (ref 1.7–2.4)

## 2020-05-26 MED ORDER — ENSURE ENLIVE PO LIQD: 237.0000 mL | ORAL | 12 refills | Status: AC

## 2020-05-26 MED ORDER — TORSEMIDE 20 MG PO TABS: 60.0000 mg | ORAL_TABLET | Freq: Two times a day (BID) | ORAL | 1 refills | Status: DC

## 2020-05-26 MED ORDER — CARVEDILOL 3.125 MG PO TABS
3.1250 mg | ORAL_TABLET | Freq: Two times a day (BID) | ORAL | 2 refills | Status: DC
Start: 2020-05-26 — End: 2020-06-15

## 2020-05-26 MED ORDER — POTASSIUM CHLORIDE CRYS ER 20 MEQ PO TBCR
40.0000 meq | EXTENDED_RELEASE_TABLET | Freq: Once | ORAL | Status: AC
  Administered 2020-05-26: 40 meq via ORAL
  Filled 2020-05-26: qty 2

## 2020-05-26 MED ORDER — B COMPLEX-C PO TABS
1.0000 | ORAL_TABLET | Freq: Every day | ORAL | 1 refills | Status: DC
Start: 2020-05-27 — End: 2020-06-15

## 2020-05-26 NOTE — Chronic Care Management (AMB) (Signed)
Chronic Care Management Pharmacy Assistant   Name: [name] [name]  MRN: [id number] DOB: Aug 08, 1955  Reason for Encounter: Medication Review  PCP : [name], MD    05/26/2020-  [facility] received new prescriptions for a decrease dose on Carvedilol, B-Complex/Vitamin C and Ensure. Pharmacy called patient to see if he needed prescriptions. Patient stated he will get ensure from local store. He does need the Carvedilol 3.125 mg and will discontinue taking the Carvedilol 12.5 mg. Patient will also need the B-Complex/Vitamin C prescription. [facility] will remove Carvedilol 12.5 mg from pharmacy profile and will deliver medications 05/26/2020.  Debbora Dus, CPP aware.   Allergies:   Allergies  Allergen Reactions  . Contrast Media [Iodinated Diagnostic Agents] Rash and Other (See Comments)    Got very hot and red   . Glipizide Other (See Comments)    ANTIDIABETICS. Burning  . Hydroxychloroquine Other (See Comments)    Stomach upset  . Metrizamide Other (See Comments)    Got very hot and red  . Penicillins Hives and Swelling    Has patient had a PCN reaction causing immediate rash, facial/tongue/throat swelling, SOB or lightheadedness with hypotension: yes Has patient had a PCN reaction causing severe rash involving mucus membranes or skin necrosis: no  Has patient had a PCN reaction that required hospitalization: yes Has patient had a PCN reaction occurring within the last 10 years: no If all of the above answers are "NO", then may proceed with Cephalosporin use.     Medications: Facility-Administered Encounter Medications as of 05/26/2020  Medication  . 0.9 %  sodium chloride infusion  . acetaminophen (TYLENOL) tablet 650 mg  . albuterol (VENTOLIN HFA) 108 (90 Base) MCG/ACT inhaler 2 puff  . B-complex with vitamin C tablet 1 tablet  . busPIRone (BUSPAR) tablet 20 mg  . calcitRIOL (ROCALTROL) capsule 0.25 mcg  . carvedilol (COREG) tablet 3.125 mg  .  clopidogrel (PLAVIX) tablet 75 mg  . cyclobenzaprine (FLEXERIL) tablet 10 mg  . feeding supplement (ENSURE ENLIVE / ENSURE PLUS) liquid 237 mL  . fentaNYL (SUBLIMAZE) injection 50 mcg  . finasteride (PROSCAR) tablet 5 mg  . furosemide (LASIX) 120 mg in dextrose 5 % 50 mL IVPB  . insulin aspart (novoLOG) injection 0-5 Units  . insulin aspart (novoLOG) injection 0-9 Units  . isosorbide mononitrate (IMDUR) 24 hr tablet 30 mg  . midazolam (VERSED) injection 1 mg  . nitroGLYCERIN (NITROSTAT) SL tablet 0.4 mg  . ondansetron (ZOFRAN) injection 4 mg  . ondansetron (ZOFRAN) injection 4 mg  . oxyCODONE (Oxy IR/ROXICODONE) immediate release tablet 5 mg  . polyvinyl alcohol (LIQUIFILM TEARS) 1.4 % ophthalmic solution 1 drop  . rosuvastatin (CRESTOR) tablet 10 mg  . sertraline (ZOLOFT) tablet 100 mg  . sodium chloride flush (NS) 0.9 % injection 3 mL  . sodium chloride flush (NS) 0.9 % injection 3 mL  . tamsulosin (FLOMAX) capsule 0.4 mg   Outpatient Encounter Medications as of 05/26/2020  Medication Sig  . acetaminophen (TYLENOL) 500 MG tablet Take 500 mg by mouth every 6 (six) hours as needed for moderate pain or headache.  . albuterol (PROAIR HFA) 108 (90 Base) MCG/ACT inhaler Inhale 2 puffs into the lungs every 6 (six) hours as needed for shortness of breath.  [name] alum & mag hydroxide-simeth (MAALOX/MYLANTA) 200-200-20 MG/5ML suspension Take 30 mLs by mouth every 6 (six) hours as needed for indigestion or heartburn.  Derrill Memo ON 05/27/2020] B Complex-C (B-COMPLEX WITH VITAMIN C) tablet Take  1 tablet by mouth daily.  . busPIRone (BUSPAR) 10 MG tablet TAKE 2 TABLETS(20 MG) BY MOUTH TWICE DAILY (Patient taking differently: Take 20 mg by mouth 2 (two) times daily. )  . calcitRIOL (ROCALTROL) 0.25 MCG capsule Take 0.25 mcg by mouth every Monday, Wednesday, and Friday.   . carvedilol (COREG) 12.5 MG tablet Take 1 tablet by mouth at breakfast and 1 tablet at bedtime.  . carvedilol (COREG) 3.125 MG tablet  Take 1 tablet (3.125 mg total) by mouth 2 (two) times daily with a meal.  . clopidogrel (PLAVIX) 75 MG tablet Take 1 tablet (75 mg total) by mouth daily.  . CONTOUR TEST test strip USE TO check blood sugar ONCE DAILY AS DIRECTED  . cyclobenzaprine (FLEXERIL) 10 MG tablet Take 1 tablet (10 mg total) by mouth 3 (three) times daily as needed for muscle spasms.  . Dextran 70-Hypromellose (ARTIFICIAL TEARS) 0.1-0.3 % SOLN Place 1 drop into both eyes 4 (four) times daily as needed (dry eyes).  . diphenhydrAMINE (BENADRYL) 25 MG tablet Take 50 mg by mouth daily as needed for allergies.  Derrill Memo ON 05/27/2020] feeding supplement (ENSURE ENLIVE / ENSURE PLUS) LIQD Take 237 mLs by mouth daily.  . finasteride (PROSCAR) 5 MG tablet Take 5 mg by mouth daily.  . hydrALAZINE (APRESOLINE) 25 MG tablet Take 1 tablet (25 mg total) by mouth every 8 (eight) hours.  . insulin aspart (NOVOLOG) 100 UNIT/ML injection Inject 0-9 Units into the skin 3 (three) times daily with meals.  . insulin NPH Human (HUMULIN N) 100 UNIT/ML injection Inject 0.1-0.25 mLs (10-25 Units total) into the skin daily as needed (high blood sugar over 235).  . insulin regular (NOVOLIN R) 100 units/mL injection Inject 0.1-0.25 mLs (10-25 Units total) into the skin daily as needed (high blood sugar over 235).  . Insulin Syringe-Needle U-100 30G X 15/64" 1 ML MISC Use to inject insulin as directed  . isosorbide mononitrate (IMDUR) 30 MG 24 hr tablet Take 1 tablet (30 mg total) by mouth daily.  . nitroGLYCERIN (NITROSTAT) 0.4 MG SL tablet DISSOLVE 1 TABLET UNDER THE TONGUE EVERY 5 MINUTES AS NEEDED FOR CHEST PAIN. DO NOT EXCEED A TOTAL OF 3 DOSES IN 15 MINUTES. (Patient taking differently: Place 0.4 mg under the tongue every 5 (five) minutes as needed for chest pain. )  . predniSONE (DELTASONE) 50 MG tablet Take 13 hours, seven hours, and 1 hour prior to your CT.  . rosuvastatin (CRESTOR) 10 MG tablet Take 1 tablet (10 mg total) by mouth daily.  .  sertraline (ZOLOFT) 100 MG tablet Take 1 tablet (100 mg total) by mouth daily.  . tamsulosin (FLOMAX) 0.4 MG CAPS capsule Take 1 capsule by mouth daily (Patient taking differently: Take 0.4 mg by mouth daily. )  . torsemide (DEMADEX) 20 MG tablet Take 3 tablets (60 mg total) by mouth 2 (two) times daily.    Current Diagnosis: Patient Active Problem List   Diagnosis Date Noted  . Acute on chronic combined systolic (congestive) and diastolic (congestive) heart failure ([place]) 05/20/2020  . Demand ischemia ([place])   . Acute blood loss anemia 04/09/2020  . Chronic combined systolic and diastolic CHF (congestive heart failure) ([place]) 04/09/2020  . CKD (chronic kidney disease), stage IV ([place]) 04/09/2020  . Depression 04/09/2020  . Aortic stenosis 04/09/2020  . Elevated troponin I level 04/09/2020  . Anemia in chronic kidney disease 04/07/2020  . Ischemic cardiomyopathy 03/25/2020  . Non-ST elevation (NSTEMI) myocardial infarction ([place]) 03/23/2020  .  CAD (coronary artery disease) 03/22/2020  . Chronic heart failure with preserved ejection fraction (HFpEF) ([place]) 03/21/2020  . Hx of CABG 03/21/2020  . Anemia 03/21/2020  . Unstable angina ([place]) 03/20/2020  . Chronic kidney disease   . History of non-ST elevation myocardial infarction (NSTEMI) 02/13/2020  . Moderate aortic valve stenosis 02/13/2020  . Acute on chronic combined systolic and diastolic CHF (congestive heart failure) ([place]) 02/13/2020  . Hypertensive urgency 02/13/2020  . Fall with injury 11/16/2019  . Leg weakness, bilateral 11/16/2019  . Benign hypertensive kidney disease with chronic kidney disease 09/02/2019  . Proteinuria 09/02/2019  . Secondary hyperparathyroidism of renal origin ([place]) 09/02/2019  . Left arm pain 02/11/2019  . Gastroesophageal reflux 03/25/2018  . Atherosclerosis of aorta ([place]) 09/04/2017  . Thoracic aortic aneurysm ([place]) 09/04/2017  . Right thyroid nodule 04/12/2017  . Thrombocytopenia ([place]) 03/16/2017  .  CKD stage 4 due to type 2 diabetes mellitus ([place]) 03/15/2017  . Advance directive discussed with patient 03/15/2017  . BMI 40.0-44.9, adult ([place]) 09/12/2016  . Psoriatic arthritis ([place])   . Lung nodule < 6cm on CT 03/29/2016  . COPD with acute bronchitis ([place]) 03/29/2016  . Headache 09/14/2015  . Constipation 10/05/2014  . PVD (peripheral vascular disease) ([place]) 04/23/2014  . Memory loss 04/23/2014  . Preventative health care 11/23/2013  . Benign prostatic hypertrophy without urinary obstruction 03/25/2013  . Enlarged prostate without lower urinary tract symptoms ([place]) 03/25/2013  . Peripheral vascular disease due to secondary diabetes mellitus ([place]) 03/25/2013  . Obesity 02/12/2013  . Atherosclerotic heart disease of native coronary artery with angina pectoris ([place]) 06/06/2012  . DM (diabetes mellitus), type 2 with complications ([place]) 56/25/6389  . Severe aortic stenosis   . Low back pain 12/27/2011  . MDD (major depressive disorder), recurrent episode ([place]) 05/28/2011  . Carotid artery disease ([place]) 04/17/2011  . Occlusion and stenosis of carotid artery 04/17/2011  . Obstructive sleep apnea 03/05/2011  . Psoriasis 03/05/2011  . Type 2 diabetes mellitus with diabetic peripheral angiopathy without gangrene ([place]) 03/05/2011  . Polyneuropathy 03/05/2011  . 3-vessel CAD   . Hyperlipidemia, mixed   . Essential hypertension      Follow-Up:  Coordination of Enhanced Pharmacy Services   Pattricia Boss, [place] Pharmacist Assistant [phone]

## 2020-05-26 NOTE — TOC Initial Note (Signed)
Transition of Care [facility]) - Initial/Assessment Note    Patient Details  Name: [name] [name] MRN: [id number] Date of Birth: [birth year]  Transition of Care [facility]) CM/SW Contact:    Victorino Dike, RN Phone Number: 05/26/2020, 10:27 AM  Clinical Narrative:                  Met with patient in room, he reports living with his son, daughter in law and grandson.  Patient no longer drives.  Family provides transportation and cooks meals.  Medications are delivered to home.  He uses pre-packed medication cups.  DME in home WS, cane, rollater, shower chair, toilet riser and ramp.    Patient will continue services with [facility] RN with [facility].  Will request order from MD.   Expected Discharge Plan: Home/Self Care Barriers to Discharge: Continued Medical Work up   Patient Goals and CMS Choice Patient states their goals for this hospitalization and ongoing recovery are:: to return home with his family CMS Medicare.gov Compare Post Acute Care list provided to:: Patient Choice offered to / list presented to : Patient  Expected Discharge Plan and Services Expected Discharge Plan: Home/Self Care   Discharge Planning Services: CM Consult Post Acute Care Choice: NA (wc, cane, rollater, toilet riser, ramp and shower chair) Living arrangements for the past 2 months: Single Family Home Expected Discharge Date: 05/26/20                                    Prior Living Arrangements/Services Living arrangements for the past 2 months: Single Family Home Lives with:: Self, Adult Children, Other (Comment) (grandson) Patient language and need for interpreter reviewed:: Yes Do you feel safe going back to the place where you live?: Yes      Need for Family Participation in Patient Care: Yes (Comment) Care giver support system in place?: Yes (comment) Current home services: DME, Other (comment) (wc, cane, rollater, toilet riser, ramp and shower chair, personal care services with nurse) Criminal  Activity/Legal Involvement Pertinent to Current Situation/Hospitalization: No - Comment as needed  Activities of Daily Living Home Assistive Devices/Equipment: CPAP, Eyeglasses, Dentures (specify type) ADL Screening (condition at time of admission) Patient's cognitive ability adequate to safely complete daily activities?: Yes Is the patient deaf or have difficulty hearing?: Yes Does the patient have difficulty seeing, even when wearing glasses/contacts?: No Does the patient have difficulty concentrating, remembering, or making decisions?: No Patient able to express need for assistance with ADLs?: Yes Does the patient have difficulty dressing or bathing?: No Independently performs ADLs?: Yes (appropriate for developmental age) Does the patient have difficulty walking or climbing stairs?: Yes Weakness of Legs: None Weakness of Arms/Hands: None  Permission Sought/Granted                  Emotional Assessment Appearance:: Appears older than stated age, Well-Groomed Attitude/Demeanor/Rapport: Engaged, Gracious Affect (typically observed): Accepting, Appropriate, Pleasant, Other (comment) (changes topic frequently) Orientation: : Oriented to Self, Oriented to Place, Oriented to Situation Alcohol / Substance Use: Not Applicable Psych Involvement: No (comment)  Admission diagnosis:  Acute on chronic combined systolic (congestive) and diastolic (congestive) heart failure (HCC) [I50.43] Acute on chronic combined systolic and diastolic CHF (congestive heart failure) ([place]) [I50.43] Patient Active Problem List   Diagnosis Date Noted  . Acute on chronic combined systolic (congestive) and diastolic (congestive) heart failure ([place]) 05/20/2020  . Demand ischemia ([place])   .  Acute blood loss anemia 04/09/2020  . Chronic combined systolic and diastolic CHF (congestive heart failure) ([place]) 04/09/2020  . CKD (chronic kidney disease), stage IV ([place]) 04/09/2020  . Depression 04/09/2020  . Aortic  stenosis 04/09/2020  . Elevated troponin I level 04/09/2020  . Anemia in chronic kidney disease 04/07/2020  . Ischemic cardiomyopathy 03/25/2020  . Non-ST elevation (NSTEMI) myocardial infarction ([place]) 03/23/2020  . CAD (coronary artery disease) 03/22/2020  . Chronic heart failure with preserved ejection fraction (HFpEF) ([place]) 03/21/2020  . Hx of CABG 03/21/2020  . Anemia 03/21/2020  . Unstable angina ([place]) 03/20/2020  . Chronic kidney disease   . History of non-ST elevation myocardial infarction (NSTEMI) 02/13/2020  . Moderate aortic valve stenosis 02/13/2020  . Acute on chronic combined systolic and diastolic CHF (congestive heart failure) ([place]) 02/13/2020  . Hypertensive urgency 02/13/2020  . Fall with injury 11/16/2019  . Leg weakness, bilateral 11/16/2019  . Benign hypertensive kidney disease with chronic kidney disease 09/02/2019  . Proteinuria 09/02/2019  . Secondary hyperparathyroidism of renal origin ([place]) 09/02/2019  . Left arm pain 02/11/2019  . Gastroesophageal reflux 03/25/2018  . Atherosclerosis of aorta ([place]) 09/04/2017  . Thoracic aortic aneurysm ([place]) 09/04/2017  . Right thyroid nodule 04/12/2017  . Thrombocytopenia ([place]) 03/16/2017  . CKD stage 4 due to type 2 diabetes mellitus ([place]) 03/15/2017  . Advance directive discussed with patient 03/15/2017  . BMI 40.0-44.9, adult ([place]) 09/12/2016  . Psoriatic arthritis ([place])   . Lung nodule < 6cm on CT 03/29/2016  . COPD with acute bronchitis ([place]) 03/29/2016  . Headache 09/14/2015  . Constipation 10/05/2014  . PVD (peripheral vascular disease) ([employer]) 04/23/2014  . Memory loss 04/23/2014  . Preventative health care 11/23/2013  . Benign prostatic hypertrophy without urinary obstruction 03/25/2013  . Enlarged prostate without lower urinary tract symptoms (l[place]) 03/25/2013  . Peripheral vascular disease due to secondary diabetes mellitus ([place]) 03/25/2013  . Obesity 02/12/2013  . Atherosclerotic heart disease of native  coronary artery with angina pectoris ([place]) 06/06/2012  . DM (diabetes mellitus), type 2 with complications ([place]) 26/94/8546  . Severe aortic stenosis   . Low back pain 12/27/2011  . MDD (major depressive disorder), recurrent episode ([place]) 05/28/2011  . Carotid artery disease ([place]) 04/17/2011  . Occlusion and stenosis of carotid artery 04/17/2011  . Obstructive sleep apnea 03/05/2011  . Psoriasis 03/05/2011  . Type 2 diabetes mellitus with diabetic peripheral angiopathy without gangrene ([place]) 03/05/2011  . Polyneuropathy 03/05/2011  . 3-vessel CAD   . Hyperlipidemia, mixed   . Essential hypertension    PCP:  [name], MD Pharmacy:   [facility] [place] [address] [place] [address] Phone: [phone] Fax: [phone]  [facility] - [place], [place] - [address] [place] [address] Phone: [phone] Fax: [phone]  [facility] [place], [place] [address] [place] [address] Phone: [phone] Fax: [phone]     Social Determinants of Health (SDOH) Interventions    Readmission Risk Interventions Readmission Risk Prevention Plan 05/26/2020 03/22/2020 02/18/2020  Transportation Screening Complete Complete -  PCP or Specialist Appt within 3-5 Days - - Complete  HRI or [place] - - Complete  Social Work Consult for [place] Planning/Counseling - - Complete  Palliative Care Screening - - Not Applicable  Medication Review (RN Care Manager) Referral  to Pharmacy Complete Complete  PCP or Specialist appointment within 3-5 days of discharge Complete Complete -  HRI or Home Care Consult Complete Complete -  SW Recovery Care/Counseling Consult Complete Complete -  Palliative Care Screening Not Applicable Not Applicable -  [place] Not Applicable Not Applicable -   Some recent data might be hidden

## 2020-05-26 NOTE — Discharge Summary (Signed)
Physician Discharge Summary  [name] [name] BZJ:[id number] DOB: [birth year] DOA: 05/20/2020  PCP: [name], MD  Admit date: 05/20/2020 Discharge date: 05/26/2020  Admitted From: home Disposition:  home  Recommendations for Outpatient Follow-up:  1. Follow up with PCP in 1-2 weeks 2. Please obtain BMP/CBC in one week 3. Please follow up with cardiology 4. Follow up with nephrology 5. Follow up with vascular surgery in 1 month  Home Health: resumed prior Equipment/Devices: none, already has   Discharge Condition: Stable CODE STATUS: Full  Diet recommendation: Renal, low sodium, carb modified   Discharge Diagnoses: Principal Problem:   Acute on chronic combined systolic and diastolic CHF (congestive heart failure) (HCC) Active Problems:   DM (diabetes mellitus), type 2 with complications (HCC)   CAD (coronary artery disease)   CKD (chronic kidney disease), stage IV (HCC)   Aortic stenosis   Anemia in chronic kidney disease   Acute on chronic combined systolic (congestive) and diastolic (congestive) heart failure (HCC)    Summary of HPI and Hospital Course:  [name] [name] is a 64 y.o. male with medical history significant for coronary artery disease status post three-vessel CABG, status post recent PCI with stent angioplasty of the ostial right coronary artery, history of diabetes mellitus with stage IV chronic kidney disease, severe AS and hypertension who presented as an outpatient for creation of an AV fistula (brachiocephalic) in preparation for initiation of renal replacement therapy.  He was given Versed for sedation.  Treated procedure   he developed acute respiratory distress requiring noninvasive mechanical ventilation.  He was also hypoglycemic with initial blood glucose levels in the 40s.  This was treated with IV dextrose.  Admitted for acute on chronic diastolic CHF with pulmonary edema, undergoing IV diuresis.   Patient reported 6-week history of black  stools and noted to have severe anemia due to chronic GI blood loss.  3 units PRBCs have been transfused this admission.  GI was consulted, but due to patient's high risk for cardiovascular complications, no endoscopic evaluation has been pursued at this time.  Patient's hemoglobin has since stabilized.  Given CKD stage IV, nephrology and vascular surgery are following as well.  Left brachiocephalic AV fistula was placed on 05/24/2020.    Acute on chronic combined systolic/diastolic CHF /ischemic cardiomyopathy / valvular heart disease 2D echo in August 2021 showed EF 30 to 01%, grade 2 diastolic dysfunction, with mitral and aortic valvular disease.  Cardiology consulted.  Pt remains volume overloaded.   Further IV diuresis recommended but patient adamant to leave the hospital.  Cardiology and nephrology were agreeable to discharge with very close follow.  Discharged on torsemide 60 mg PO bid.   Severe mitral regurgitation Severe aortic stenosis -patient being evaluated for possible TAVR in the next few months  History of CAD s/p CABG, PCI to RCA 03/2020 -continue Plavix, Crestor, Coreg, Imdur  CKD stage IV -nephrology and vascular surgery consulted,  S/p placement of Left AV fistula on 05/24/2020. Expect fistula will take about 4 weeks to mature.   Follow-up in vascular clinic in 1 month.    Will require PermCath if dialysis required prior to fistula maturing.   Monitor renal function.  GI bleeding -appears resolved.  GI unable to perform endoscopic evaluation due to high cardiovascular risk.  Prior colonoscopy in October 2016 showed few polyps that were resected.  EGD on 04/11/2020 - for any source of bleeding, showed hiatal hernia.  Acute on chronic blood loss anemia, iron deficiency anemia -  s/p transfusion of 3 units PRBCs this admission (2 on 10/31, 1 on 10/29).     Hbg since stable.  Repeat CBC in follow up.  Mild thrombocytopenia -improved, platelets 147k.  Repeat CBC in follow  up.  Insulin-dependent type 2 diabetes, episode of hypoglycemia - resume home insulin NPH with supplemental sliding scale NovoLog.    Peripheral neuropathy -previously has tried gabapentin and Lyrica but states did not like them.  BPH - finasteride and Flomax  OSA -CPAP   Depression/anxiety -Zoloft, BuSpar  Muscle spasms -Flexeril as needed   Obesity: Body mass index is 35.93 kg/m.   Complicates overall care and prognosis.    Discharge Instructions   Discharge Instructions    (HEART FAILURE PATIENTS) Call MD:  Anytime you have any of the following symptoms: 1) 3 pound weight gain in 24 hours or 5 pounds in 1 week 2) shortness of breath, with or without a dry hacking cough 3) swelling in the hands, feet or stomach 4) if you have to sleep on extra pillows at night in order to breathe.   Complete by: As directed    Call MD for:  extreme fatigue   Complete by: As directed    Call MD for:  persistant dizziness or light-headedness   Complete by: As directed    Call MD for:  persistant nausea and vomiting   Complete by: As directed    Call MD for:  severe uncontrolled pain   Complete by: As directed    Call MD for:  temperature >100.4   Complete by: As directed    Diet - low sodium heart healthy   Complete by: As directed    Diet Carb Modified   Complete by: As directed    Discharge instructions   Complete by: As directed    Please take your medication exactly as prescribed.  You should have close follow up with both cardiology and nephrology.  Continue to weigh yourself daily to monitor your fluid balance.  I stopped your hydralazine for now, because of being on higher dose diuretic, your BP has been normal without hydralazine.  This may be added back in follow up in the future.    Your diuretic was increased - torsemide 60 mg twice daily.   Discharge wound care:   Complete by: As directed    Left arm AV fistula - glue will come off on its own over time.  No sutures  need to be removed.  Do not pick at the glue as it becomes loose, you could cause incision to open and have significant bleeding.  Follow up in Vascular clinic in 1 month.   Increase activity slowly   Complete by: As directed      Allergies as of 05/26/2020      Reactions   Contrast Media [iodinated Diagnostic Agents] Rash, Other (See Comments)   Got very hot and red   Glipizide Other (See Comments)   ANTIDIABETICS. Burning   Hydroxychloroquine Other (See Comments)   Stomach upset   Metrizamide Other (See Comments)   Got very hot and red   Penicillins Hives, Swelling   Has patient had a PCN reaction causing immediate rash, facial/tongue/throat swelling, SOB or lightheadedness with hypotension: yes Has patient had a PCN reaction causing severe rash involving mucus membranes or skin necrosis: no  Has patient had a PCN reaction that required hospitalization: yes Has patient had a PCN reaction occurring within the last 10 years: no If all of the above  answers are "NO", then may proceed with Cephalosporin use.      Medication List    STOP taking these medications   hydrALAZINE 25 MG tablet Commonly known as: APRESOLINE   predniSONE 50 MG tablet Commonly known as: DELTASONE     TAKE these medications   acetaminophen 500 MG tablet Commonly known as: TYLENOL Take 500 mg by mouth every 6 (six) hours as needed for moderate pain or headache.   albuterol 108 (90 Base) MCG/ACT inhaler Commonly known as: ProAir HFA Inhale 2 puffs into the lungs every 6 (six) hours as needed for shortness of breath.   alum & mag hydroxide-simeth 200-200-20 MG/5ML suspension Commonly known as: MAALOX/MYLANTA Take 30 mLs by mouth every 6 (six) hours as needed for indigestion or heartburn.   Artificial Tears 0.1-0.3 % Soln Generic drug: Dextran 70-Hypromellose Place 1 drop into both eyes 4 (four) times daily as needed (dry eyes).   B-complex with vitamin C tablet Take 1 tablet by mouth daily. Start  taking on: May 27, 2020   busPIRone 10 MG tablet Commonly known as: BUSPAR TAKE 2 TABLETS(20 MG) BY MOUTH TWICE DAILY What changed:   how much to take  how to take this  when to take this  additional instructions   calcitRIOL 0.25 MCG capsule Commonly known as: ROCALTROL Take 0.25 mcg by mouth every Monday, Wednesday, and Friday.   carvedilol 3.125 MG tablet Commonly known as: COREG Take 1 tablet (3.125 mg total) by mouth 2 (two) times daily with a meal. What changed:   medication strength  how much to take  how to take this  when to take this  additional instructions   clopidogrel 75 MG tablet Commonly known as: PLAVIX Take 1 tablet (75 mg total) by mouth daily.   Contour Test test strip Generic drug: glucose blood USE TO check blood sugar ONCE DAILY AS DIRECTED   cyclobenzaprine 10 MG tablet Commonly known as: FLEXERIL Take 1 tablet (10 mg total) by mouth 3 (three) times daily as needed for muscle spasms.   diphenhydrAMINE 25 MG tablet Commonly known as: BENADRYL Take 50 mg by mouth daily as needed for allergies.   feeding supplement Liqd Take 237 mLs by mouth daily. Start taking on: May 27, 2020   finasteride 5 MG tablet Commonly known as: PROSCAR Take 5 mg by mouth daily.   insulin aspart 100 UNIT/ML injection Commonly known as: novoLOG Inject 0-9 Units into the skin 3 (three) times daily with meals.   insulin NPH Human 100 UNIT/ML injection Commonly known as: HumuLIN N Inject 0.1-0.25 mLs (10-25 Units total) into the skin daily as needed (high blood sugar over 235).   insulin regular 100 units/mL injection Commonly known as: NOVOLIN R Inject 0.1-0.25 mLs (10-25 Units total) into the skin daily as needed (high blood sugar over 235).   Insulin Syringe-Needle U-100 30G X 15/64" 1 ML Misc Use to inject insulin as directed   isosorbide mononitrate 30 MG 24 hr tablet Commonly known as: IMDUR Take 1 tablet (30 mg total) by mouth  daily.   nitroGLYCERIN 0.4 MG SL tablet Commonly known as: NITROSTAT DISSOLVE 1 TABLET UNDER THE TONGUE EVERY 5 MINUTES AS NEEDED FOR CHEST PAIN. DO NOT EXCEED A TOTAL OF 3 DOSES IN 15 MINUTES. What changed: See the new instructions.   rosuvastatin 10 MG tablet Commonly known as: CRESTOR Take 1 tablet (10 mg total) by mouth daily.   sertraline 100 MG tablet Commonly known as: ZOLOFT Take 1 tablet (  100 mg total) by mouth daily.   tamsulosin 0.4 MG Caps capsule Commonly known as: FLOMAX Take 1 capsule by mouth daily What changed:   how much to take  how to take this  when to take this  additional instructions   torsemide 20 MG tablet Commonly known as: DEMADEX Take 3 tablets (60 mg total) by mouth 2 (two) times daily. What changed: how much to take            Discharge Care Instructions  (From admission, onward)         Start     Ordered   05/26/20 0000  Discharge wound care:       Comments: Left arm AV fistula - glue will come off on its own over time.  No sutures need to be removed.  Do not pick at the glue as it becomes loose, you could cause incision to open and have significant bleeding.  Follow up in Vascular clinic in 1 month.   05/26/20 1001          Follow-up Information    Kris Hartmann, NP Follow up in 1 month(s).   Specialty: Vascular Surgery Why: First postop visit.  Incision check.  Will need HDA. Contact information: [place] [address] [phone]        Wellington Hampshire, MD. Schedule an appointment as soon as possible for a visit in 1 week(s).   Specialty: Cardiology Contact information: [place] [address] [phone]        Viviana Simpler I, MD. Schedule an appointment as soon as possible for a visit in 1 week(s).   Specialties: Internal Medicine, Pediatrics Contact information: [place] [address] [phone]        Lavonia Dana, MD  Follow up.   Specialty: Nephrology Contact information: [address] [place] [address] [phone]              Allergies  Allergen Reactions  . Contrast Media [Iodinated Diagnostic Agents] Rash and Other (See Comments)    Got very hot and red   . Glipizide Other (See Comments)    ANTIDIABETICS. Burning  . Hydroxychloroquine Other (See Comments)    Stomach upset  . Metrizamide Other (See Comments)    Got very hot and red  . Penicillins Hives and Swelling    Has patient had a PCN reaction causing immediate rash, facial/tongue/throat swelling, SOB or lightheadedness with hypotension: yes Has patient had a PCN reaction causing severe rash involving mucus membranes or skin necrosis: no  Has patient had a PCN reaction that required hospitalization: yes Has patient had a PCN reaction occurring within the last 10 years: no If all of the above answers are "NO", then may proceed with Cephalosporin use.     Consultations:  Cardiology  Nephrology  Vascular surgery   Procedures/Studies: CT CORONARY MORPH W/CTA COR W/SCORE W/CA W/CM &/OR WO/CM  Addendum Date: 05/10/2020   ADDENDUM REPORT: 05/10/2020 09:41 CLINICAL DATA:  64 year old male with h/o CAD, ischemic cardiomyopathy and aortic stenosis being evaluated for a TAVR procedure. EXAM: Cardiac TAVR CT TECHNIQUE: The patient was scanned on a [employer]. A 120 kV retrospective scan was triggered in the descending thoracic aorta at 111 HU's. Gantry rotation speed was 250 msecs and collimation was .6 mm. 12.5 mg of PO Carvedilol and no nitro were given. The 3D data set was reconstructed in 5% intervals of  the R-R cycle. Systolic and diastolic phases were analyzed on a dedicated work station using MPR, MIP and VRT modes. The patient received 80 cc of contrast. FINDINGS: Aortic Valve: Severely calcified trileaflet aortic valve with severely restricted leaflet openings and no calcifications extending  into the LVOT. Aortic vale calcium score is 2534 consistent with severe aortic stenosis. Aorta: There is mild ascending aortic aneurysm with maximum diameter 42.5 mm. There is diffuse atherosclerotic plaque and calcifications without dissection. Sinotubular Junction: 32 x 32 mm Ascending Thoracic Aorta: 42.5 x 40.7 mm Aortic Arch: 32 x 32 mm Descending Thoracic Aorta: 29 x 28 mm Sinus of Valsalva Measurements: Non-coronary: 37 mm Right -coronary: 35 mm Left -coronary: 35 mm Coronary Artery Height above Annulus: Left Main: 15 mm Right Coronary: 21 mm Virtual Basal Annulus Measurements: Maximum/Minimum Diameter: 30.9 x 25.1 mm Mean Diameter: 26.8 mm Perimeter: 86.1 mm Area: 563 mm2 Optimum Fluoroscopic Angle for Delivery: RAO 2 CAU 0 IMPRESSION: 1. Severely calcified trileaflet aortic valve with severely restricted leaflet openings and no calcifications extending into the LVOT. Aortic vale calcium score is 2534 consistent with severe aortic stenosis. Aortic measurements suitable for delivery of a 29 mm Edwards-SAPIEN 3 valve. 2. Sufficient coronary to annulus distance. 3. There is mild ascending aortic aneurysm with maximum diameter 42.5 mm. There is diffuse atherosclerotic plaque and calcifications without dissection. 4. Optimum Fluoroscopic Angle for Delivery:  RAO 2 CAU 0 5. No thrombus in the left atrial appendage. Electronically Signed   By: Ena Dawley   On: 05/10/2020 09:41   Result Date: 05/10/2020 EXAM: OVER-READ INTERPRETATION  CT CHEST The following report is an over-read performed by radiologist Dr. Vinnie Langton of [facility], [place] on 05/05/2020. This over-read does not include interpretation of cardiac or coronary anatomy or pathology. The coronary calcium score/coronary CTA interpretation by the cardiologist is attached. COMPARISON:  Chest CT 07/01/2019. FINDINGS: Extracardiac findings will be described separately under dictation for contemporaneously obtained CTA chest, abdomen and  pelvis. IMPRESSION: Please see separate dictation for contemporaneously obtained CTA chest, abdomen and pelvis dated 05/05/2020 for full description of relevant extracardiac findings. Electronically Signed: By: Vinnie Langton M.D. On: 05/05/2020 12:30   DG Chest Portable 1 View  Result Date: 05/20/2020 CLINICAL DATA:  Shortness of breath EXAM: PORTABLE CHEST 1 VIEW COMPARISON:  04/09/2020 FINDINGS: Post CABG changes. Stable cardiomediastinal contours. Pulmonary vascular congestion mild diffuse interstitial opacities throughout both lungs. Suspect trace bilateral pleural effusions. No pneumothorax. IMPRESSION: Findings suggestive of CHF with mild pulmonary edema. These results will be called to the ordering clinician or representative by the Radiologist Assistant, and communication documented in the PACS or [employer]. Electronically Signed   By: Davina Poke D.O.   On: 05/20/2020 08:19   [place] OR NERVE BLOCK-IMAGE ONLY [facility])  Result Date: 05/24/2020 There is no interpretation for this exam.  This order is for images obtained during a surgical procedure.  Please See "Surgeries" Tab for more information regarding the procedure.   [place] OR NERVE BLOCK-IMAGE ONLY [facility])  Result Date: 05/20/2020 There is no interpretation for this exam.  This order is for images obtained during a surgical procedure.  Please See "Surgeries" Tab for more information regarding the procedure.   CT ANGIO CHEST AORTA W/CM & OR WO/CM  Addendum Date: 05/07/2020   ADDENDUM REPORT: 05/07/2020 12:52 CLINICAL DATA:  64 year old male with h/o CAD, ischemic cardiomyopathy and aortic stenosis being evaluated for a TAVR procedure. EXAM: Cardiac TAVR CT TECHNIQUE: The patient was scanned on a  [employer]. A 120 kV retrospective scan was triggered in the descending thoracic aorta at 111 HU's. Gantry rotation speed was 250 msecs and collimation was .6 mm. 12.5 mg of PO Carvedilol and no nitro were given. The 3D data set  was reconstructed in 5% intervals of the R-R cycle. Systolic and diastolic phases were analyzed on a dedicated work station using MPR, MIP and VRT modes. The patient received 80 cc of contrast. FINDINGS: Aortic Valve: Severely calcified trileaflet aortic valve with severely restricted leaflet openings and no calcifications extending into the LVOT. Aortic vale calcium score is 2534 consistent with severe aortic stenosis. Aorta: There is mild ascending aortic aneurysm with maximum diameter 42.5 mm. There is diffuse atherosclerotic plaque and calcifications without dissection. Sinotubular Junction: 32 x 32 mm Ascending Thoracic Aorta: 42.5 x 40.7 mm Aortic Arch: 32 x 32 mm Descending Thoracic Aorta: 29 x 28 mm Sinus of Valsalva Measurements: Non-coronary: 37 mm Right -coronary: 35 mm Left -coronary: 35 mm Coronary Artery Height above Annulus: Left Main: 15 mm Right Coronary: 21 mm Virtual Basal Annulus Measurements: Maximum/Minimum Diameter: 30.9 x 25.1 mm Mean Diameter: 26.8 mm Perimeter: 86.1 mm Area: 563 mm2 Optimum Fluoroscopic Angle for Delivery: RAO 2 CAU 0 IMPRESSION: 1. Severely calcified trileaflet aortic valve with severely restricted leaflet openings and no calcifications extending into the LVOT. Aortic vale calcium score is 2534 consistent with severe aortic stenosis. Aortic measurements suitable for delivery of a 29 mm Edwards-SAPIEN 3 valve. 2. Sufficient coronary to annulus distance. 3. There is mild ascending aortic aneurysm with maximum diameter 42.5 mm. There is diffuse atherosclerotic plaque and calcifications without dissection. 4. Optimum Fluoroscopic Angle for Delivery:  RAO 2 CAU 0 5. No thrombus in the left atrial appendage. Electronically Signed   By: Ena Dawley   On: 05/07/2020 12:52   Result Date: 05/07/2020 CLINICAL DATA:  64 year old male with history of severe aortic stenosis. Preprocedural study prior to potential transcatheter aortic valve replacement (TAVR) procedure. EXAM: CT  ANGIOGRAPHY CHEST, ABDOMEN AND PELVIS TECHNIQUE: Multidetector CT imaging through the chest, abdomen and pelvis was performed using the standard protocol during bolus administration of intravenous contrast. Multiplanar reconstructed images and MIPs were obtained and reviewed to evaluate the vascular anatomy. CONTRAST:  63mL OMNIPAQUE IOHEXOL 350 MG/ML SOLN COMPARISON:  Chest CT 07/01/2019. FINDINGS: CTA CHEST FINDINGS Cardiovascular: Heart size is mildly enlarged. There is no significant pericardial fluid, thickening or pericardial calcification. There is aortic atherosclerosis, as well as atherosclerosis of the great vessels of the mediastinum and the coronary arteries, including calcified atherosclerotic plaque in the left main, left anterior descending, left circumflex and right coronary arteries. Status post median sternotomy for CABG including LIMA to the LAD. Severe thickening calcification of the aortic valve. Mediastinum/Lymph Nodes: No pathologically enlarged mediastinal or hilar lymph nodes. Esophagus is unremarkable in appearance. No axillary lymphadenopathy. Lungs/Pleura: Small bilateral pleural effusions lying dependently. Widespread areas of ground-glass attenuation and interlobular septal thickening noted throughout the lungs bilaterally, suggesting a background of very mild interstitial pulmonary edema. No acute consolidative airspace disease. Musculoskeletal/Soft Tissues: Median sternotomy wires. Old healed fracture of the lateral aspect of the right sixth rib incidentally noted. There are no aggressive appearing lytic or blastic lesions noted in the visualized portions of the skeleton. CTA ABDOMEN AND PELVIS FINDINGS Hepatobiliary: Liver has a shrunken appearance and nodular contour, indicative of underlying cirrhosis. Numerous tiny calcified granulomas are scattered throughout the hepatic parenchyma. No suspicious appearing hepatic lesions. No intra or extrahepatic biliary ductal dilatation.  Tiny  calcified gallstone lying dependently in the gallbladder. No findings to suggest an acute cholecystitis at this time. Pancreas: No pancreatic mass. No pancreatic ductal dilatation. No pancreatic or peripancreatic fluid collections or inflammatory changes. Spleen: Small calcified granulomas in the spleen. Adrenals/Urinary Tract: Atrophy of the kidneys bilaterally, where there are innumerable low-attenuation lesions, most of which are subcentimeter in size and too small to definitively characterize, but favored to represent cysts. The largest of these are compatible with simple cysts, measuring up to 3.4 cm in the lower pole of the left kidney. Multiple nonobstructive calculi are noted within the collecting systems of both kidneys measuring up to 1.3 cm in diameter in the lower pole collecting system of the left kidney. No hydroureteronephrosis. Urinary bladder is unremarkable in appearance. Bilateral adrenal glands are normal in appearance. Stomach/Bowel: Normal appearance of the stomach. No pathologic dilatation of small bowel or colon. Several colonic diverticulae are noted, particularly in the descending colon and sigmoid colon, without surrounding inflammatory changes to suggest an acute diverticulitis at this time. Normal appendix. Vascular/Lymphatic: Aortic atherosclerosis, without evidence of aneurysm or dissection in the abdominal or pelvic vasculature. Vascular findings and measurements pertinent to potential TAVR procedure, as detailed below. No lymphadenopathy noted in the abdomen or pelvis. Reproductive: Prostate gland and seminal vesicles are unremarkable in appearance. Other: No significant volume of ascites.  No pneumoperitoneum. Musculoskeletal: Compression fracture of superior endplate of L2 with 82% loss of anterior vertebral body height. There are no aggressive appearing lytic or blastic lesions noted in the visualized portions of the skeleton. VASCULAR MEASUREMENTS PERTINENT TO TAVR: AORTA:  Minimal Aortic Diameter-11 x 6 mm Severity of Aortic Calcification-severe RIGHT PELVIS: Right Common Iliac Artery - Minimal Diameter-4.0 x 3.4 mm Tortuosity-mild Calcification-severe Right External Iliac Artery - Minimal Diameter-2.9 x 2.9 mm Tortuosity - mild Calcification - severe Right Common Femoral Artery - Minimal Diameter-4.2 x 3.3 mm Tortuosity - mild Calcification - severe LEFT PELVIS: Left Common Iliac Artery - Minimal Diameter-2.4 x 1.5 mm Tortuosity - mild Calcification - severe Left External Iliac Artery - Minimal Diameter - Patent lumen not confidently identified Tortuosity - mild Calcification - severe Left Common Femoral Artery - Minimal Diameter-4.0 x 3.5 mm Tortuosity - mild Calcification - severe Review of the MIP images confirms the above findings. IMPRESSION: 1. Vascular findings and measurements pertinent to potential TAVR procedure, as detailed above. 2. Severe thickening calcification of the aortic valve, compatible with reported clinical history of severe aortic stenosis. 3. Aortic atherosclerosis, in addition to left main and 3 vessel coronary artery disease. Status post median sternotomy for CABG including LIMA to the LAD. 4. Cardiomegaly with small bilateral pleural effusions and evidence of interstitial pulmonary edema in the lungs; imaging findings concerning for congestive heart failure. 5. Morphologic changes in the liver suggestive of underlying cirrhosis. 6. Cholelithiasis without evidence of acute cholecystitis at this time. 7. Severe atrophy of the kidneys bilaterally with multiple small lesions, likely to represent innumerable cysts. 8. Colonic diverticulosis without evidence of acute diverticulitis at this time. 9. Additional incidental findings, as above. Electronically Signed: By: Vinnie Langton M.D. On: 05/05/2020 14:17   VAS [place] UPPER EXTREMITY ARTERIAL DUPLEX  Result Date: 04/28/2020 UPPER EXTREMITY DUPLEX STUDY Limitations: Renal disease Performing Technologist: Concha Norway RVT  Examination Guidelines: A complete evaluation includes B-mode imaging, spectral Doppler, color Doppler, and power Doppler as needed of all accessible portions of each vessel. Bilateral testing is considered an integral part of a complete examination. Limited examinations for reoccurring  indications may be performed as noted.  Right Pre-Dialysis Findings: +-----------------------+----------+--------------------+----------+--------+ Location               PSV (cm/s)Intralum. Diam. (cm)Waveform  Comments +-----------------------+----------+--------------------+----------+--------+ Brachial Antecub. fossa92        0.37                triphasic          +-----------------------+----------+--------------------+----------+--------+ Radial Art at Wrist    59        0.19                monophasic         +-----------------------+----------+--------------------+----------+--------+ Ulnar Art at Wrist                                             occluded +-----------------------+----------+--------------------+----------+--------+ Left Pre-Dialysis Findings: +----------------------+---------+-------------------+---------+---------------+ Location              PSV      Intralum. Diam.    Waveform Comments                              (cm/s)   (cm)                                        +----------------------+---------+-------------------+---------+---------------+ Brachial Antecub.     100      0.44               triphasic                fossa                                                                      +----------------------+---------+-------------------+---------+---------------+ Radial Art at Wrist                                        Removed for                                                                CABG            +----------------------+---------+-------------------+---------+---------------+ Ulnar Art at Wrist    83       0.23                triphasic                +----------------------+---------+-------------------+---------+---------------+  Summary:  Right: Occluded distal ulnar artery. Brachial and radial artery are        patent. Left: Left radial artery was removed for CABG. Ulnar and brachial       arteries are patent. *See table(s) above for measurements and observations. Electronically signed by Hortencia Pilar  MD on 04/28/2020 at 4:30:07 PM.    Final    VAS [place] UPPER EXT VEIN MAPPING (PRE-OP AVF)  Result Date: 04/28/2020 UPPER EXTREMITY VEIN MAPPING  Indications: Pre-access. History: ESRD.  Performing Technologist: Concha Norway RVT  Examination Guidelines: A complete evaluation includes B-mode imaging, spectral Doppler, color Doppler, and power Doppler as needed of all accessible portions of each vessel. Bilateral testing is considered an integral part of a complete examination. Limited examinations for reoccurring indications may be performed as noted. +-----------------+-------------+----------+--------+ Right Cephalic   Diameter (cm)Depth (cm)Findings +-----------------+-------------+----------+--------+ Prox upper arm       0.23                        +-----------------+-------------+----------+--------+ Mid upper arm        0.32                        +-----------------+-------------+----------+--------+ Dist upper arm       0.34                        +-----------------+-------------+----------+--------+ Antecubital fossa    0.53                        +-----------------+-------------+----------+--------+ Prox forearm         0.44                        +-----------------+-------------+----------+--------+ Mid forearm          0.22                        +-----------------+-------------+----------+--------+ Dist forearm         0.25                        +-----------------+-------------+----------+--------+ +-----------------+-------------+----------+--------+ Right Basilic     Diameter (cm)Depth (cm)Findings +-----------------+-------------+----------+--------+ Prox upper arm       0.26                        +-----------------+-------------+----------+--------+ Mid upper arm        0.49                        +-----------------+-------------+----------+--------+ Dist upper arm       0.27                        +-----------------+-------------+----------+--------+ Antecubital fossa    0.27                        +-----------------+-------------+----------+--------+ Prox forearm         0.30                        +-----------------+-------------+----------+--------+ +-----------------+-------------+----------+--------+ Left Cephalic    Diameter (cm)Depth (cm)Findings +-----------------+-------------+----------+--------+ Prox upper arm       0.23                        +-----------------+-------------+----------+--------+ Mid upper arm        0.31                        +-----------------+-------------+----------+--------+ Dist upper arm  0.35                        +-----------------+-------------+----------+--------+ Antecubital fossa    0.38                        +-----------------+-------------+----------+--------+ Prox forearm         0.53                        +-----------------+-------------+----------+--------+ Mid forearm          0.26                        +-----------------+-------------+----------+--------+ Dist forearm         0.28                        +-----------------+-------------+----------+--------+ +-----------------+-------------+----------+--------+ Left Basilic     Diameter (cm)Depth (cm)Findings +-----------------+-------------+----------+--------+ Prox upper arm       0.44                        +-----------------+-------------+----------+--------+ Mid upper arm        0.47                        +-----------------+-------------+----------+--------+ Dist upper arm        0.41                        +-----------------+-------------+----------+--------+ Antecubital fossa    0.30                        +-----------------+-------------+----------+--------+ Prox forearm         0.37                        +-----------------+-------------+----------+--------+ Summary: Right: Compressible vessels. Left: Compressible vessels. *See table(s) above for measurements and observations.  Diagnosing physician: Hortencia Pilar MD Electronically signed by Hortencia Pilar MD on 04/28/2020 at 4:30:05 PM.    Final    CT ANGIO ABDOMEN PELVIS  W &/OR WO CONTRAST  Addendum Date: 05/07/2020   ADDENDUM REPORT: 05/07/2020 12:52 CLINICAL DATA:  64 year old male with h/o CAD, ischemic cardiomyopathy and aortic stenosis being evaluated for a TAVR procedure. EXAM: Cardiac TAVR CT TECHNIQUE: The patient was scanned on a [employer]. A 120 kV retrospective scan was triggered in the descending thoracic aorta at 111 HU's. Gantry rotation speed was 250 msecs and collimation was .6 mm. 12.5 mg of PO Carvedilol and no nitro were given. The 3D data set was reconstructed in 5% intervals of the R-R cycle. Systolic and diastolic phases were analyzed on a dedicated work station using MPR, MIP and VRT modes. The patient received 80 cc of contrast. FINDINGS: Aortic Valve: Severely calcified trileaflet aortic valve with severely restricted leaflet openings and no calcifications extending into the LVOT. Aortic vale calcium score is 2534 consistent with severe aortic stenosis. Aorta: There is mild ascending aortic aneurysm with maximum diameter 42.5 mm. There is diffuse atherosclerotic plaque and calcifications without dissection. Sinotubular Junction: 32 x 32 mm Ascending Thoracic Aorta: 42.5 x 40.7 mm Aortic Arch: 32 x 32 mm Descending Thoracic Aorta: 29 x 28 mm Sinus of Valsalva Measurements: Non-coronary: 37 mm Right -coronary: 35 mm Left -coronary: 35 mm Coronary Artery Height  above Annulus: Left  Main: 15 mm Right Coronary: 21 mm Virtual Basal Annulus Measurements: Maximum/Minimum Diameter: 30.9 x 25.1 mm Mean Diameter: 26.8 mm Perimeter: 86.1 mm Area: 563 mm2 Optimum Fluoroscopic Angle for Delivery: RAO 2 CAU 0 IMPRESSION: 1. Severely calcified trileaflet aortic valve with severely restricted leaflet openings and no calcifications extending into the LVOT. Aortic vale calcium score is 2534 consistent with severe aortic stenosis. Aortic measurements suitable for delivery of a 29 mm Edwards-SAPIEN 3 valve. 2. Sufficient coronary to annulus distance. 3. There is mild ascending aortic aneurysm with maximum diameter 42.5 mm. There is diffuse atherosclerotic plaque and calcifications without dissection. 4. Optimum Fluoroscopic Angle for Delivery:  RAO 2 CAU 0 5. No thrombus in the left atrial appendage. Electronically Signed   By: Ena Dawley   On: 05/07/2020 12:52   Result Date: 05/07/2020 CLINICAL DATA:  64 year old male with history of severe aortic stenosis. Preprocedural study prior to potential transcatheter aortic valve replacement (TAVR) procedure. EXAM: CT ANGIOGRAPHY CHEST, ABDOMEN AND PELVIS TECHNIQUE: Multidetector CT imaging through the chest, abdomen and pelvis was performed using the standard protocol during bolus administration of intravenous contrast. Multiplanar reconstructed images and MIPs were obtained and reviewed to evaluate the vascular anatomy. CONTRAST:  65mL OMNIPAQUE IOHEXOL 350 MG/ML SOLN COMPARISON:  Chest CT 07/01/2019. FINDINGS: CTA CHEST FINDINGS Cardiovascular: Heart size is mildly enlarged. There is no significant pericardial fluid, thickening or pericardial calcification. There is aortic atherosclerosis, as well as atherosclerosis of the great vessels of the mediastinum and the coronary arteries, including calcified atherosclerotic plaque in the left main, left anterior descending, left circumflex and right coronary arteries. Status post median sternotomy for CABG  including LIMA to the LAD. Severe thickening calcification of the aortic valve. Mediastinum/Lymph Nodes: No pathologically enlarged mediastinal or hilar lymph nodes. Esophagus is unremarkable in appearance. No axillary lymphadenopathy. Lungs/Pleura: Small bilateral pleural effusions lying dependently. Widespread areas of ground-glass attenuation and interlobular septal thickening noted throughout the lungs bilaterally, suggesting a background of very mild interstitial pulmonary edema. No acute consolidative airspace disease. Musculoskeletal/Soft Tissues: Median sternotomy wires. Old healed fracture of the lateral aspect of the right sixth rib incidentally noted. There are no aggressive appearing lytic or blastic lesions noted in the visualized portions of the skeleton. CTA ABDOMEN AND PELVIS FINDINGS Hepatobiliary: Liver has a shrunken appearance and nodular contour, indicative of underlying cirrhosis. Numerous tiny calcified granulomas are scattered throughout the hepatic parenchyma. No suspicious appearing hepatic lesions. No intra or extrahepatic biliary ductal dilatation. Tiny calcified gallstone lying dependently in the gallbladder. No findings to suggest an acute cholecystitis at this time. Pancreas: No pancreatic mass. No pancreatic ductal dilatation. No pancreatic or peripancreatic fluid collections or inflammatory changes. Spleen: Small calcified granulomas in the spleen. Adrenals/Urinary Tract: Atrophy of the kidneys bilaterally, where there are innumerable low-attenuation lesions, most of which are subcentimeter in size and too small to definitively characterize, but favored to represent cysts. The largest of these are compatible with simple cysts, measuring up to 3.4 cm in the lower pole of the left kidney. Multiple nonobstructive calculi are noted within the collecting systems of both kidneys measuring up to 1.3 cm in diameter in the lower pole collecting system of the left kidney. No  hydroureteronephrosis. Urinary bladder is unremarkable in appearance. Bilateral adrenal glands are normal in appearance. Stomach/Bowel: Normal appearance of the stomach. No pathologic dilatation of small bowel or colon. Several colonic diverticulae are noted, particularly in the descending colon and sigmoid colon, without surrounding inflammatory changes to  suggest an acute diverticulitis at this time. Normal appendix. Vascular/Lymphatic: Aortic atherosclerosis, without evidence of aneurysm or dissection in the abdominal or pelvic vasculature. Vascular findings and measurements pertinent to potential TAVR procedure, as detailed below. No lymphadenopathy noted in the abdomen or pelvis. Reproductive: Prostate gland and seminal vesicles are unremarkable in appearance. Other: No significant volume of ascites.  No pneumoperitoneum. Musculoskeletal: Compression fracture of superior endplate of L2 with 29% loss of anterior vertebral body height. There are no aggressive appearing lytic or blastic lesions noted in the visualized portions of the skeleton. VASCULAR MEASUREMENTS PERTINENT TO TAVR: AORTA: Minimal Aortic Diameter-11 x 6 mm Severity of Aortic Calcification-severe RIGHT PELVIS: Right Common Iliac Artery - Minimal Diameter-4.0 x 3.4 mm Tortuosity-mild Calcification-severe Right External Iliac Artery - Minimal Diameter-2.9 x 2.9 mm Tortuosity - mild Calcification - severe Right Common Femoral Artery - Minimal Diameter-4.2 x 3.3 mm Tortuosity - mild Calcification - severe LEFT PELVIS: Left Common Iliac Artery - Minimal Diameter-2.4 x 1.5 mm Tortuosity - mild Calcification - severe Left External Iliac Artery - Minimal Diameter - Patent lumen not confidently identified Tortuosity - mild Calcification - severe Left Common Femoral Artery - Minimal Diameter-4.0 x 3.5 mm Tortuosity - mild Calcification - severe Review of the MIP images confirms the above findings. IMPRESSION: 1. Vascular findings and measurements pertinent  to potential TAVR procedure, as detailed above. 2. Severe thickening calcification of the aortic valve, compatible with reported clinical history of severe aortic stenosis. 3. Aortic atherosclerosis, in addition to left main and 3 vessel coronary artery disease. Status post median sternotomy for CABG including LIMA to the LAD. 4. Cardiomegaly with small bilateral pleural effusions and evidence of interstitial pulmonary edema in the lungs; imaging findings concerning for congestive heart failure. 5. Morphologic changes in the liver suggestive of underlying cirrhosis. 6. Cholelithiasis without evidence of acute cholecystitis at this time. 7. Severe atrophy of the kidneys bilaterally with multiple small lesions, likely to represent innumerable cysts. 8. Colonic diverticulosis without evidence of acute diverticulitis at this time. 9. Additional incidental findings, as above. Electronically Signed: By: Vinnie Langton M.D. On: 05/05/2020 14:17       Subjective: Pt seen this AM.  Says he is going home today, regardless of what cardiology recommends.  Advised he would benefit from further IV diuresis.  Pt states he cannot sleep here, has been miserable since coming in, will be going home.  Says he feels fine and denies shortness of breath, chest pain or other complaints.   Discharge Exam: Vitals:   05/26/20 0422 05/26/20 0846  BP: (!) 159/70 (!) 110/52  Pulse: 93 98  Resp: 17 18  Temp: 98.3 F (36.8 C) 97.6 F (36.4 C)  SpO2: 100% 96%   Vitals:   05/25/20 2034 05/25/20 2034 05/26/20 0422 05/26/20 0846  BP: 118/67 118/67 (!) 159/70 (!) 110/52  Pulse: 85 85 93 98  Resp: 20 18 17 18   Temp: 98.5 F (36.9 C) 98.5 F (36.9 C) 98.3 F (36.8 C) 97.6 F (36.4 C)  TempSrc: Oral  Oral   SpO2: 98% 98% 100% 96%  Weight:   101.3 kg   Height:        General: Pt is alert, awake, not in acute distress Cardiovascular: RRR, S1/S2 +, no rubs, no gallops Respiratory: CTA bilaterally, no wheezing, no  rhonchi Abdominal: Soft, NT, ND, bowel sounds + Extremities: no edema, no cyanosis    The results of significant diagnostics from this hospitalization (including imaging, microbiology, ancillary and laboratory) are  listed below for reference.     Microbiology: Recent Results (from the past 240 hour(s))  SARS CORONAVIRUS 2 (TAT 6-24 HRS) Nasopharyngeal Nasopharyngeal Swab     Status: None   Collection Time: 05/18/20 11:54 AM   Specimen: Nasopharyngeal Swab  Result Value Ref Range Status   SARS Coronavirus 2 NEGATIVE NEGATIVE Final    Comment: (NOTE) SARS-CoV-2 target nucleic acids are NOT DETECTED.  The SARS-CoV-2 RNA is generally detectable in upper and lower respiratory specimens during the acute phase of infection. Negative results do not preclude SARS-CoV-2 infection, do not rule out co-infections with other pathogens, and should not be used as the sole basis for treatment or other patient management decisions. Negative results must be combined with clinical observations, patient history, and epidemiological information. The expected result is Negative.  Fact Sheet for Patients: SugarRoll.be  Fact Sheet for Healthcare Providers: https://www.woods-mathews.com/  This test is not yet approved or cleared by the [place] FDA and  has been authorized for detection and/or diagnosis of SARS-CoV-2 by FDA under an Emergency Use Authorization (EUA). This EUA will remain  in effect (meaning this test can be used) for the duration of the COVID-19 declaration under Se ction 564(b)(1) of the Act, 21 U.S.C. section 360bbb-3(b)(1), unless the authorization is terminated or revoked sooner.  Performed at [facility], [place] [address], [place], [place] [address]      Labs: BNP (last 3 results) Recent Labs    02/13/20 0033 03/20/20 0620  BNP 713.3* 789.3*   Basic Metabolic Panel: Recent Labs  Lab 05/22/20 0517 05/23/20 0429  05/24/20 0617 05/25/20 0633 05/26/20 0629  NA 136 135 135 135 133*  K 4.0 3.6 3.9 3.7 3.5  CL 100 98 96* 98 96*  CO2 24 26 24 24 25   GLUCOSE 203* 140* 151* 232* 161*  BUN 78* 81* 83* 82* 87*  CREATININE 3.94* 3.78* 3.84* 3.64* 3.66*  CALCIUM 7.9* 7.7* 8.2* 8.3* 8.2*  MG  --   --  2.2  --  2.5*   Liver Function Tests: No results for input(s): AST, ALT, ALKPHOS, BILITOT, PROT, ALBUMIN in the last 168 hours. No results for input(s): LIPASE, AMYLASE in the last 168 hours. No results for input(s): AMMONIA in the last 168 hours. CBC: Recent Labs  Lab 05/22/20 1119 05/23/20 0429 05/24/20 0617  WBC 6.2 6.8 7.1  NEUTROABS 4.2 4.3 4.7  HGB 5.9* 8.5* 9.0*  HCT 18.9* 25.8* 27.8*  MCV 94.0 90.8 90.8  PLT 121* 128* 147*   Cardiac Enzymes: No results for input(s): CKTOTAL, CKMB, CKMBINDEX, TROPONINI in the last 168 hours. BNP: Invalid input(s): POCBNP CBG: Recent Labs  Lab 05/25/20 0818 05/25/20 1139 05/25/20 1617 05/25/20 2038 05/26/20 0843  GLUCAP 205* 145* 213* 130* 152*   D-Dimer No results for input(s): DDIMER in the last 72 hours. Hgb A1c No results for input(s): HGBA1C in the last 72 hours. Lipid Profile No results for input(s): CHOL, HDL, LDLCALC, TRIG, CHOLHDL, LDLDIRECT in the last 72 hours. Thyroid function studies No results for input(s): TSH, T4TOTAL, T3FREE, THYROIDAB in the last 72 hours.  Invalid input(s): FREET3 Anemia work up No results for input(s): VITAMINB12, FOLATE, FERRITIN, TIBC, IRON, RETICCTPCT in the last 72 hours. Urinalysis    Component Value Date/Time   COLORURINE YELLOW (A) 02/08/2020 1106   APPEARANCEUR CLEAR (A) 02/08/2020 1106   APPEARANCEUR Clear 06/13/2015 1012   LABSPEC 1.011 02/08/2020 1106   PHURINE 7.0 02/08/2020 1106   GLUCOSEU NEGATIVE 02/08/2020 1106  HGBUR NEGATIVE 02/08/2020 1106   [place] 02/08/2020 1106   BILIRUBINUR Negative 06/13/2015 1012   KETONESUR NEGATIVE 02/08/2020 1106   PROTEINUR 100 (A)  02/08/2020 1106   UROBILINOGEN 0.2 12/21/2014 1336   NITRITE NEGATIVE 02/08/2020 1106   LEUKOCYTESUR NEGATIVE 02/08/2020 1106   Sepsis Labs Invalid input(s): PROCALCITONIN,  WBC,  LACTICIDVEN Microbiology Recent Results (from the past 240 hour(s))  SARS CORONAVIRUS 2 (TAT 6-24 HRS) Nasopharyngeal Nasopharyngeal Swab     Status: None   Collection Time: 05/18/20 11:54 AM   Specimen: Nasopharyngeal Swab  Result Value Ref Range Status   SARS Coronavirus 2 NEGATIVE NEGATIVE Final    Comment: (NOTE) SARS-CoV-2 target nucleic acids are NOT DETECTED.  The SARS-CoV-2 RNA is generally detectable in upper and lower respiratory specimens during the acute phase of infection. Negative results do not preclude SARS-CoV-2 infection, do not rule out co-infections with other pathogens, and should not be used as the sole basis for treatment or other patient management decisions. Negative results must be combined with clinical observations, patient history, and epidemiological information. The expected result is Negative.  Fact Sheet for Patients: SugarRoll.be  Fact Sheet for Healthcare Providers: https://www.woods-mathews.com/  This test is not yet approved or cleared by the [place] FDA and  has been authorized for detection and/or diagnosis of SARS-CoV-2 by FDA under an Emergency Use Authorization (EUA). This EUA will remain  in effect (meaning this test can be used) for the duration of the COVID-19 declaration under Se ction 564(b)(1) of the Act, 21 U.S.C. section 360bbb-3(b)(1), unless the authorization is terminated or revoked sooner.  Performed at [facility], [place] [address], [place], [place] [address]      Time coordinating discharge: Over 30 minutes  SIGNED:   Ezekiel Slocumb, DO [facility] 05/26/2020, 10:02 AM   If 7PM-7AM, please contact night-coverage [email or web address]

## 2020-05-26 NOTE — Progress Notes (Signed)
Progress Note  Patient Name: [name] [name] Date of Encounter: 05/26/2020  [facility] Cardiologist: [name], MD   Subjective   Doing well today, would like to go home.  Feels that his weight is at his baseline Reports he is 225 at home but has not weighed recently and has been losing weight At home was taking torsemide 40 twice daily Denies excessive fluid intake  Inpatient Medications    Scheduled Meds: . B-complex with vitamin C  1 tablet Oral Daily  . busPIRone  20 mg Oral BID  . calcitRIOL  0.25 mcg Oral Q M,W,F  . carvedilol  3.125 mg Oral BID WC  . clopidogrel  75 mg Oral Daily  . feeding supplement  237 mL Oral Q24H  . finasteride  5 mg Oral Daily  . insulin aspart  0-5 Units Subcutaneous QHS  . insulin aspart  0-9 Units Subcutaneous TID WC  . isosorbide mononitrate  30 mg Oral Daily  . potassium chloride  40 mEq Oral Once  . rosuvastatin  10 mg Oral Daily  . sertraline  100 mg Oral Daily  . sodium chloride flush  3 mL Intravenous Q12H  . tamsulosin  0.4 mg Oral Daily   Continuous Infusions: . sodium chloride    . furosemide 120 mg (05/25/20 1729)   PRN Meds: sodium chloride, acetaminophen, albuterol, cyclobenzaprine, fentaNYL, midazolam, nitroGLYCERIN, ondansetron (ZOFRAN) IV, ondansetron (ZOFRAN) IV, oxyCODONE, polyvinyl alcohol, sodium chloride flush   Vital Signs    Vitals:   05/25/20 2034 05/25/20 2034 05/26/20 0422 05/26/20 0846  BP: 118/67 118/67 (!) 159/70 (!) 110/52  Pulse: 85 85 93 98  Resp: 20 18 17 18   Temp: 98.5 F (36.9 C) 98.5 F (36.9 C) 98.3 F (36.8 C) 97.6 F (36.4 C)  TempSrc: Oral  Oral   SpO2: 98% 98% 100% 96%  Weight:   101.3 kg   Height:        Intake/Output Summary (Last 24 hours) at 05/26/2020 0915 Last data filed at 05/25/2020 0950 Gross per 24 hour  Intake --  Output 0 ml  Net 0 ml   Last 3 Weights 05/26/2020 05/25/2020 05/24/2020  Weight (lbs) 223 lb 6.4 oz 222 lb 9.6 oz 226 lb 3.2 oz  Weight (kg)  101.334 kg 100.971 kg 102.604 kg  Some encounter information is confidential and restricted. Go to Review Flowsheets activity to see all data.      Telemetry    Normal sinus rhythm- Personally Reviewed  ECG     - Personally Reviewed  Physical Exam   GEN: No acute distress.   Neck: No JVD Cardiac: RRR, no murmurs, rubs, or gallops.  Respiratory: Clear to auscultation bilaterally. Healing AV graft site left arm GI: Soft, nontender, non-distended  MS: No edema; No deformity. Neuro:  Nonfocal  Psych: Normal affect   Labs    High Sensitivity Troponin:   Recent Labs  Lab 05/20/20 1509 05/20/20 1727  TROPONINIHS 70* 72*      Chemistry Recent Labs  Lab 05/24/20 0617 05/25/20 0633 05/26/20 0629  NA 135 135 133*  K 3.9 3.7 3.5  CL 96* 98 96*  CO2 24 24 25   GLUCOSE 151* 232* 161*  BUN 83* 82* 87*  CREATININE 3.84* 3.64* 3.66*  CALCIUM 8.2* 8.3* 8.2*  GFRNONAA 17* 18* 17*  ANIONGAP 15 13 12      Hematology Recent Labs  Lab 05/22/20 1119 05/23/20 0429 05/24/20 0617  WBC 6.2 6.8 7.1  RBC 2.01* 2.84* 3.06*  HGB 5.9* 8.5* 9.0*  HCT 18.9* 25.8* 27.8*  MCV 94.0 90.8 90.8  MCH 29.4 29.9 29.4  MCHC 31.2 32.9 32.4  RDW 17.0* 17.8* 17.3*  PLT 121* 128* 147*    BNPNo results for input(s): BNP, PROBNP in the last 168 hours.   DDimer No results for input(s): DDIMER in the last 168 hours.   Radiology    [place] OR NERVE BLOCK-IMAGE ONLY [facility])  Result Date: 05/24/2020 There is no interpretation for this exam.  This order is for images obtained during a surgical procedure.  Please See "Surgeries" Tab for more information regarding the procedure.    Cardiac Studies   cath 03/2020  Ost RCA to Prox RCA lesion is 99% stenosed.  Post intervention, there is a 0% residual stenosis.  A drug-eluting stent was successfully placed using a STENT RESOLUTE ONYX 4.0X18.   Successful angioplasty and drug-eluting stent placement to the ostial right coronary artery.      Patient Profile   64 year old gentleman with history of CAD/CABG x3, ischemic cardiomyopathy, EF 30 to 35%, severe aortic stenosis, CKD being seen for worsening shortness of breath and heart failure exacerbation.  Assessment & Plan    1.  Ischemic cardiomyopathy, EF 30 to 35% Euvolemic on today's exam Has been treated with IV Lasix this admission Rounded with nephrology this morning Plan discussed Reports he is taking torsemide 40 twice daily at home, recommendation to increase torsemide up to 60 twice daily Continue Coreg  2.  Severe AS Contributing to CHF symptoms TAVR appointment dec  2nd  8:45 in the AM Suspect symptoms may stabilize after procedure  3.  History of CAD/CABG, PCI to RCA 03/2020 -Plavix, Crestor, Imdur, Coreg Denies anginal symptoms  4.  CKD stage IV Has AV graft in place -May need dialysis in the near future -AV fistula graft needs time to mature  Started discharge instructions, CHF education, will discuss with hospitalist service, case discussed with nephrology  Total encounter time more than 35 minutes  Greater than 50% was spent in counseling and coordination of care with the patient      For questions or updates, please contact [place] Please consult [email or web address] for contact info under        Signed, Ida Rogue, MD  05/26/2020, 9:15 AM

## 2020-05-26 NOTE — Progress Notes (Signed)
Mobility Specialist - Progress Note   05/26/20 1052  Mobility  Activity Refused mobility  Mobility performed by Mobility specialist    Pt politely declined mobility session at this time. Pt currently preparing for dc.      [name] [name] Mobility Specialist  05/26/20, 10:53 AM

## 2020-05-26 NOTE — Progress Notes (Signed)
[facility]  ROUNDING NOTE   Subjective:   No complaints. Wants to go home.  Furosemide 120mg  IV q12  Objective:  Vital signs in last 24 hours:  Temp:  [97.6 F (36.4 C)-98.5 F (36.9 C)] 97.6 F (36.4 C) (11/04 0846) Pulse Rate:  [85-98] 98 (11/04 0846) Resp:  [17-20] 18 (11/04 0846) BP: (110-159)/(52-80) 110/52 (11/04 0846) SpO2:  [96 %-100 %] 96 % (11/04 0846) Weight:  [101.3 kg] 101.3 kg (11/04 0422)  Weight change: 0.363 kg Filed Weights   05/24/20 0422 05/25/20 0423 05/26/20 0422  Weight: 102.6 kg 101 kg 101.3 kg    Intake/Output: I/O last 3 completed shifts: In: -  Out: 600 [Urine:600]   Intake/Output this shift:  Total I/O In: 240 [P.O.:240] Out: -   Physical Exam: General: Sitting up in bed  Head:  Normocephalic,Atraumatic  Eyes: Anicteric  Neck: Supple, trachea midline  Lungs:  Lungs with fine crackles at bases  Heart: regular  Abdomen:  Soft, nontender, nontender  Extremities:  trace peripheral edema.  Neurologic: Speech clear and appropriate,Oriented x 3  Skin: No acute lesions or rashes  Access LUA new AVF placed on 64/40/3474    Basic Metabolic Panel: Recent Labs  Lab 05/22/20 0517 05/22/20 0517 05/23/20 0429 05/23/20 0429 05/24/20 0617 05/25/20 0633 05/26/20 0629  NA 136  --  135  --  135 135 133*  K 4.0  --  3.6  --  3.9 3.7 3.5  CL 100  --  98  --  96* 98 96*  CO2 24  --  26  --  24 24 25   GLUCOSE 203*  --  140*  --  151* 232* 161*  BUN 78*  --  81*  --  83* 82* 87*  CREATININE 3.94*  --  3.78*  --  3.84* 3.64* 3.66*  CALCIUM 7.9*   < > 7.7*   < > 8.2* 8.3* 8.2*  MG  --   --   --   --  2.2  --  2.5*   < > = values in this interval not displayed.    Liver Function Tests: No results for input(s): AST, ALT, ALKPHOS, BILITOT, PROT, ALBUMIN in the last 168 hours. No results for input(s): LIPASE, AMYLASE in the last 168 hours. No results for input(s): AMMONIA in the last 168 hours.  CBC: Recent Labs  Lab  05/22/20 1119 05/23/20 0429 05/24/20 0617  WBC 6.2 6.8 7.1  NEUTROABS 4.2 4.3 4.7  HGB 5.9* 8.5* 9.0*  HCT 18.9* 25.8* 27.8*  MCV 94.0 90.8 90.8  PLT 121* 128* 147*    Cardiac Enzymes: No results for input(s): CKTOTAL, CKMB, CKMBINDEX, TROPONINI in the last 168 hours.  BNP: Invalid input(s): POCBNP  CBG: Recent Labs  Lab 05/25/20 0818 05/25/20 1139 05/25/20 1617 05/25/20 2038 05/26/20 0843  GLUCAP 205* 145* 213* 130* 152*    Microbiology: Results for orders placed or performed during the hospital encounter of 05/18/20  SARS CORONAVIRUS 2 (TAT 6-24 HRS) Nasopharyngeal Nasopharyngeal Swab     Status: None   Collection Time: 05/18/20 11:54 AM   Specimen: Nasopharyngeal Swab  Result Value Ref Range Status   SARS Coronavirus 2 NEGATIVE NEGATIVE Final    Comment: (NOTE) SARS-CoV-2 target nucleic acids are NOT DETECTED.  The SARS-CoV-2 RNA is generally detectable in upper and lower respiratory specimens during the acute phase of infection. Negative results do not preclude SARS-CoV-2 infection, do not rule out co-infections with other pathogens, and should not be  used as the sole basis for treatment or other patient management decisions. Negative results must be combined with clinical observations, patient history, and epidemiological information. The expected result is Negative.  Fact Sheet for Patients: SugarRoll.be  Fact Sheet for Healthcare Providers: https://www.woods-mathews.com/  This test is not yet approved or cleared by the [place] FDA and  has been authorized for detection and/or diagnosis of SARS-CoV-2 by FDA under an Emergency Use Authorization (EUA). This EUA will remain  in effect (meaning this test can be used) for the duration of the COVID-19 declaration under Se ction 564(b)(1) of the Act, 21 U.S.C. section 360bbb-3(b)(1), unless the authorization is terminated or revoked sooner.  Performed at [facility], [place] [address] [place], [place] [address]    *Note: Due to a large number of results and/or encounters for the requested time period, some results have not been displayed. A complete set of results can be found in Results Review.    Coagulation Studies: Recent Labs    05/24/20 0617  LABPROT 13.2  INR 1.0    Urinalysis: No results for input(s): COLORURINE, LABSPEC, PHURINE, GLUCOSEU, HGBUR, BILIRUBINUR, KETONESUR, PROTEINUR, UROBILINOGEN, NITRITE, LEUKOCYTESUR in the last 72 hours.  Invalid input(s): APPERANCEUR    Imaging: [place] OR NERVE BLOCK-IMAGE ONLY [facility])  Result Date: 05/24/2020 There is no interpretation for this exam.  This order is for images obtained during a surgical procedure.  Please See "Surgeries" Tab for more information regarding the procedure.     Medications:   . sodium chloride    . furosemide 120 mg (05/25/20 1729)   . B-complex with vitamin C  1 tablet Oral Daily  . busPIRone  20 mg Oral BID  . calcitRIOL  0.25 mcg Oral Q M,W,F  . carvedilol  3.125 mg Oral BID WC  . clopidogrel  75 mg Oral Daily  . feeding supplement  237 mL Oral Q24H  . finasteride  5 mg Oral Daily  . insulin aspart  0-5 Units Subcutaneous QHS  . insulin aspart  0-9 Units Subcutaneous TID WC  . isosorbide mononitrate  30 mg Oral Daily  . rosuvastatin  10 mg Oral Daily  . sertraline  100 mg Oral Daily  . sodium chloride flush  3 mL Intravenous Q12H  . tamsulosin  0.4 mg Oral Daily   sodium chloride, acetaminophen, albuterol, cyclobenzaprine, fentaNYL, midazolam, nitroGLYCERIN, ondansetron (ZOFRAN) IV, ondansetron (ZOFRAN) IV, oxyCODONE, polyvinyl alcohol, sodium chloride flush  Assessment/ Plan:  Mr. MILBERT BIXLER is a 64 y.o.  male with a history of PVD, Hypertension, Depression, severe aortic stenosis, obesity, DM type II, COPD, CAD with stent placement,  anemia and CKD stage IV who is admitted for Acute on chronic combined systolic and diastolic CHF.   1.  Chronic Kidney disease stage IV Lab Results  Component Value Date   CREATININE 3.66 (H) 05/26/2020   CREATININE 3.64 (H) 05/25/2020   CREATININE 3.84 (H) 05/24/2020   New Brachiocephalic fistula placed yesterday by Dr.Dew No acute indication for dialysis now - recommend transitioning to PO torsemide 60mg  bid  2. Anemia of CKD and GI bleed Hemoglobin 9 on 05/24/2020  3. Hypertension 110/52.  Current regimen of carvedilol, tamsulosin and isosorbide mononitrate.  Diuretic regimen as above: recommend torsemide 60mg  PO bid.   4. Secondary hyperparathyroidism  - calcitriol    LOS: 6 Zamyra Allensworth 11/4/202111:14 AM

## 2020-05-26 NOTE — Care Management Important Message (Signed)
Important Message  Patient Details  Name: [name] [name] MRN: [id number] Date of Birth: 1955-10-14   Medicare Important Message Given:  No  Patient discharged prior to arrival to unit to deliver concurrent Medicare IM.     [name] 05/26/2020, 1:19 PM

## 2020-05-26 NOTE — TOC Transition Note (Signed)
Transition of Care [facility]) - CM/SW Discharge Note   Patient Details  Name: [name] [name] MRN: [id number] Date of Birth: 1955/12/11  Transition of Care [facility]) CM/SW Contact:  Victorino Dike, RN Phone Number: 05/26/2020, 10:36 AM   Clinical Narrative:     Met with patient in room, he reports living with his son, daughter in law and grandson.  Patient no longer drives.  Family provides transportation and cooks meals.  Medications are delivered to home.  He uses pre-packed medication cups.  DME in home WS, cane, rollater, shower chair, toilet riser and ramp.    Patient will continue services with [facility] RN with [facility].   Final next level of care: [place] Barriers to Discharge: Barriers Resolved   Patient Goals and CMS Choice Patient states their goals for this hospitalization and ongoing recovery are:: to return home with his family CMS Medicare.gov Compare Post Acute Care list provided to:: Patient Choice offered to / list presented to : Patient  Discharge Placement                       Discharge Plan and Services   Discharge Planning Services: CM Consult Post Acute Care Choice: NA (wc, cane, rollater, toilet riser, ramp and shower chair)                    HH Arranged: RN [place] Agency: [place] ([place]) Date [place]: 05/26/20 Time [place]: [phone] Representative spoke with at [place]: [place] ([place]) Interventions     Readmission Risk Interventions Readmission Risk Prevention Plan 05/26/2020 03/22/2020 02/18/2020  Transportation Screening Complete Complete -  PCP or Specialist Appt within 3-5 Days - - Complete  HRI or Campus - - Complete  Social Work Consult for [place] Planning/Counseling - - Complete  Palliative Care Screening - - Not Applicable  Medication Review Press photographer) Referral to Pharmacy Complete Complete  PCP or Specialist appointment within  3-5 days of discharge Complete Complete -  [place] or Home Care Consult Complete Complete -  SW Recovery Care/Counseling Consult Complete Complete -  Palliative Care Screening Not Applicable Not Applicable -  [place] Not Applicable Not Applicable -  Some recent data might be hidden

## 2020-05-26 NOTE — Plan of Care (Signed)
Pt dc per MD order, dc instructions and follow up gone over with pt, iv dc. No acute distress noted.   Problem: Education: Goal: Knowledge of General Education information will improve Description: Including pain rating scale, medication(s)/side effects and non-pharmacologic comfort measures Outcome: Adequate for Discharge   Problem: Health Behavior/Discharge Planning: Goal: Ability to manage health-related needs will improve Outcome: Adequate for Discharge   Problem: Clinical Measurements: Goal: Ability to maintain clinical measurements within normal limits will improve Outcome: Adequate for Discharge Goal: Will remain free from infection Outcome: Adequate for Discharge Goal: Diagnostic test results will improve Outcome: Adequate for Discharge Goal: Respiratory complications will improve Outcome: Adequate for Discharge Goal: Cardiovascular complication will be avoided Outcome: Adequate for Discharge   Problem: Activity: Goal: Risk for activity intolerance will decrease Outcome: Adequate for Discharge   Problem: Nutrition: Goal: Adequate nutrition will be maintained Outcome: Adequate for Discharge   Problem: Coping: Goal: Level of anxiety will decrease Outcome: Adequate for Discharge   Problem: Elimination: Goal: Will not experience complications related to bowel motility Outcome: Adequate for Discharge Goal: Will not experience complications related to urinary retention Outcome: Adequate for Discharge   Problem: Pain Managment: Goal: General experience of comfort will improve Outcome: Adequate for Discharge   Problem: Safety: Goal: Ability to remain free from injury will improve Outcome: Adequate for Discharge   Problem: Skin Integrity: Goal: Risk for impaired skin integrity will decrease Outcome: Adequate for Discharge   Problem: Education: Goal: Ability to demonstrate management of disease process will improve Outcome: Adequate for Discharge Goal: Ability  to verbalize understanding of medication therapies will improve Outcome: Adequate for Discharge Goal: Individualized Educational Video(s) Outcome: Adequate for Discharge   Problem: Activity: Goal: Capacity to carry out activities will improve Outcome: Adequate for Discharge   Problem: Cardiac: Goal: Ability to achieve and maintain adequate cardiopulmonary perfusion will improve Outcome: Adequate for Discharge

## 2020-05-26 NOTE — Telephone Encounter (Signed)
Transition Care Management Follow-up Telephone Call  Date of discharge and from where: 05/26/2020, [facility]  How have you been since you were released from the hospital? Patient states that he is doing okay. Still sore and fatigue.  Any questions or concerns? No  Items Reviewed:  Did the pt receive and understand the discharge instructions provided? Yes   Medications obtained and verified? Yes   Other? No   Any new allergies since your discharge? No   Dietary orders reviewed? Yes  Do you have support at home? Yes   Home Care and Equipment/Supplies: Were home health services ordered? not applicable If so, what is the name of the agency? N/A  Has the agency set up a time to come to the patient's home? not applicable Were any new equipment or medical supplies ordered?  No What is the name of the medical supply agency? N/A Were you able to get the supplies/equipment? not applicable Do you have any questions related to the use of the equipment or supplies? No  Functional Questionnaire: (I = Independent and D = Dependent) ADLs: I  Bathing/Dressing- I  Meal Prep- I  Eating- I  Maintaining continence- I  Transferring/Ambulation- I  Managing Meds- I  Follow up appointments reviewed:   PCP Hospital f/u appt confirmed? Yes  Scheduled to see Dr. [name] on 06/01/2020 @ 12 pm.  [facility] f/u appt confirmed? Yes  Scheduled to see cardiology   Are transportation arrangements needed? No   If their condition worsens, is the pt aware to call PCP or go to the Emergency Dept.? Yes  Was the patient provided with contact information for the PCP's office or ED? Yes  Was to pt encouraged to call back with questions or concerns? Yes

## 2020-05-27 NOTE — Progress Notes (Signed)
This patient needs a follow-up for rectal bleeding.

## 2020-06-01 ENCOUNTER — Ambulatory Visit: Payer: Medicare Other | Admitting: Internal Medicine

## 2020-06-01 ENCOUNTER — Telehealth: Payer: Self-pay

## 2020-06-01 ENCOUNTER — Telehealth: Payer: Self-pay | Admitting: Radiology

## 2020-06-01 ENCOUNTER — Ambulatory Visit: Payer: Medicare Other | Admitting: Family

## 2020-06-01 ENCOUNTER — Ambulatory Visit (INDEPENDENT_AMBULATORY_CARE_PROVIDER_SITE_OTHER): Payer: Medicare Other | Admitting: Internal Medicine

## 2020-06-01 ENCOUNTER — Encounter: Payer: Self-pay | Admitting: Internal Medicine

## 2020-06-01 ENCOUNTER — Other Ambulatory Visit: Payer: Self-pay

## 2020-06-01 VITALS — BP 122/78 | HR 94 | Temp 96.1°F | Ht 66.0 in | Wt 234.0 lb

## 2020-06-01 DIAGNOSIS — E1122 Type 2 diabetes mellitus with diabetic chronic kidney disease: Secondary | ICD-10-CM | POA: Diagnosis not present

## 2020-06-01 DIAGNOSIS — D62 Acute posthemorrhagic anemia: Secondary | ICD-10-CM

## 2020-06-01 DIAGNOSIS — N184 Chronic kidney disease, stage 4 (severe): Secondary | ICD-10-CM | POA: Diagnosis not present

## 2020-06-01 DIAGNOSIS — F3130 Bipolar disorder, current episode depressed, mild or moderate severity, unspecified: Secondary | ICD-10-CM | POA: Diagnosis not present

## 2020-06-01 DIAGNOSIS — I5042 Chronic combined systolic (congestive) and diastolic (congestive) heart failure: Secondary | ICD-10-CM | POA: Diagnosis not present

## 2020-06-01 DIAGNOSIS — E118 Type 2 diabetes mellitus with unspecified complications: Secondary | ICD-10-CM | POA: Diagnosis not present

## 2020-06-01 LAB — RENAL FUNCTION PANEL
Albumin: 3.6 g/dL (ref 3.5–5.2)
BUN: 87 mg/dL (ref 6–23)
CO2: 28 mEq/L (ref 19–32)
Calcium: 8.5 mg/dL (ref 8.4–10.5)
Chloride: 92 mEq/L — ABNORMAL LOW (ref 96–112)
Creatinine, Ser: 3.83 mg/dL — ABNORMAL HIGH (ref 0.40–1.50)
GFR: 15.64 mL/min — ABNORMAL LOW (ref >60.00)
Glucose, Bld: 163 mg/dL — ABNORMAL HIGH (ref 70–99)
Phosphorus: 7.6 mg/dL — ABNORMAL HIGH (ref 2.3–4.6)
Potassium: 4.8 mEq/L (ref 3.5–5.1)
Sodium: 132 mEq/L — ABNORMAL LOW (ref 135–145)

## 2020-06-01 LAB — CBC
HCT: 25.6 % — ABNORMAL LOW (ref 39.0–52.0)
Hemoglobin: 8.3 g/dL — ABNORMAL LOW (ref 13.0–17.0)
MCHC: 32.5 g/dL (ref 30.0–36.0)
MCV: 88.8 fl (ref 78.0–100.0)
Platelets: 246 10*3/uL (ref 150.0–400.0)
RBC: 2.88 Mil/uL — ABNORMAL LOW (ref 4.22–5.81)
RDW: 16.7 % — ABNORMAL HIGH (ref 11.5–15.5)
WBC: 11.2 10*3/uL — ABNORMAL HIGH (ref 4.0–10.5)

## 2020-06-01 MED ORDER — FLUCONAZOLE 150 MG PO TABS: 150.0000 mg | ORAL_TABLET | Freq: Once | ORAL | 1 refills | Status: AC

## 2020-06-01 MED ORDER — ALBUTEROL SULFATE HFA 108 (90 BASE) MCG/ACT IN AERS: 2.0000 | INHALATION_SPRAY | Freq: Four times a day (QID) | RESPIRATORY_TRACT | 3 refills | Status: DC | PRN

## 2020-06-01 MED ORDER — ARIPIPRAZOLE 2 MG PO TABS: 2.0000 mg | ORAL_TABLET | Freq: Every day | ORAL | 1 refills | Status: AC

## 2020-06-01 NOTE — Assessment & Plan Note (Signed)
Expected to need dialysis to facilitate TAVR evaluation, etc A-V fistula placed in left arm---having pain and numbness there Will ask Dr Lucky Cowboy to reevaluate this soon

## 2020-06-01 NOTE — Assessment & Plan Note (Signed)
Will need GI evaluation once he is stable (like after TAVR)

## 2020-06-01 NOTE — Assessment & Plan Note (Signed)
Since going off bupropion due to CKD, he is now depressed severely and having psychotic symptoms Reports manic spells in past---clearly not now Will start low dose aripiprazole to add to the sertraline

## 2020-06-01 NOTE — Telephone Encounter (Signed)
1035 am.  Phone call made to patient to complete a telephonic visit.  No answer but message has been left requesting call back.  PLAN:  Awaiting call back. If no call back, I will reach out to patient in a couple of weeks.

## 2020-06-01 NOTE — Assessment & Plan Note (Signed)
Lab Results  Component Value Date   HGBA1C 5.7 (H) 02/13/2020   Asked him to avoid all long acting insulin and only use the prn novolog if over 160

## 2020-06-01 NOTE — Telephone Encounter (Signed)
Saw PCP today.  Overall stable levels compared to recent readings.  Renal hyperphosphatemia noted, calcium level normal.  Will defer to PCP.

## 2020-06-01 NOTE — Progress Notes (Signed)
Subjective:    Patient ID: [name] [name], male    DOB: [birth year], 64 y.o.   MRN: [id number]  HPI Here for hospital follow up Reviewed hospital records and discharge summary This visit occurred during the SARS-CoV-2 public health emergency.  Safety protocols were in place, including screening questions prior to the visit, additional usage of staff PPE, and extensive cleaning of exam room while observing appropriate contact time as indicated for disinfecting solutions.   Was admitted from surgery for A-V fistula by Dr Lucky Cowboy Developed acute respiratory distress Required non invasive ventilation Admitted for exacerbation of CHF---combined IV diuresis did help his breathing Sent home on torsemide 60mg  bid Dr Fletcher Anon managing this Still hopes the potential TAVR may help his heart function  Now no chest pain Breathing is still not good--wears his CPAP even in the day (because he is more comfortable) Does weigh himself---fairly stable Trouble lying on back--but doesn't like chair. Variable sleep  Frustrated by limitations in hospital Call times were delayed and he wound up with incontinence  Left arm is "killing me" Had fistula placed 11/2---and has been painful since then Pain is at shoulder and all the way down arm Decreased sensation in hand and up arm---never wakes up  Found to have anemia again Got 3 units of blood Decided to hold off on GI procedures--pending eventual repair of severe AS Still passing some blood  Checking sugars at least 3 times a day Highest since home has been 190  Current Outpatient Medications on File Prior to Visit  Medication Sig Dispense Refill  . acetaminophen (TYLENOL) 500 MG tablet Take 500 mg by mouth every 6 (six) hours as needed for moderate pain or headache.    . albuterol (PROAIR HFA) 108 (90 Base) MCG/ACT inhaler Inhale 2 puffs into the lungs every 6 (six) hours as needed for shortness of breath. 18 g 3  . alum & mag hydroxide-simeth  (MAALOX/MYLANTA) 200-200-20 MG/5ML suspension Take 30 mLs by mouth every 6 (six) hours as needed for indigestion or heartburn.    . B Complex-C (B-COMPLEX WITH VITAMIN C) tablet Take 1 tablet by mouth daily. 30 tablet 1  . busPIRone (BUSPAR) 10 MG tablet TAKE 2 TABLETS(20 MG) BY MOUTH TWICE DAILY (Patient taking differently: Take 20 mg by mouth 2 (two) times daily. ) 360 tablet 3  . calcitRIOL (ROCALTROL) 0.25 MCG capsule Take 0.25 mcg by mouth every Monday, Wednesday, and Friday.     . carvedilol (COREG) 3.125 MG tablet Take 1 tablet (3.125 mg total) by mouth 2 (two) times daily with a meal. 60 tablet 2  . clopidogrel (PLAVIX) 75 MG tablet Take 1 tablet (75 mg total) by mouth daily. 30 tablet 11  . CONTOUR TEST test strip USE TO check blood sugar ONCE DAILY AS DIRECTED 100 strip 9  . cyclobenzaprine (FLEXERIL) 10 MG tablet Take 1 tablet (10 mg total) by mouth 3 (three) times daily as needed for muscle spasms. 30 tablet 0  . Dextran 70-Hypromellose (ARTIFICIAL TEARS) 0.1-0.3 % SOLN Place 1 drop into both eyes 4 (four) times daily as needed (dry eyes).    . diphenhydrAMINE (BENADRYL) 25 MG tablet Take 50 mg by mouth daily as needed for allergies.    . feeding supplement (ENSURE ENLIVE / ENSURE PLUS) LIQD Take 237 mLs by mouth daily. 237 mL 12  . finasteride (PROSCAR) 5 MG tablet Take 5 mg by mouth daily.    . insulin aspart (NOVOLOG) 100 UNIT/ML injection Inject 0-9 Units into the  skin 3 (three) times daily with meals. 10 mL 11  . insulin regular (NOVOLIN R) 100 units/mL injection Inject 0.1-0.25 mLs (10-25 Units total) into the skin daily as needed (high blood sugar over 235). 10 mL 11  . Insulin Syringe-Needle U-100 30G X 15/64" 1 ML MISC Use to inject insulin as directed 200 each 11  . isosorbide mononitrate (IMDUR) 30 MG 24 hr tablet Take 1 tablet (30 mg total) by mouth daily. 30 tablet 0  . nitroGLYCERIN (NITROSTAT) 0.4 MG SL tablet DISSOLVE 1 TABLET UNDER THE TONGUE EVERY 5 MINUTES AS NEEDED FOR  CHEST PAIN. DO NOT EXCEED A TOTAL OF 3 DOSES IN 15 MINUTES. (Patient taking differently: Place 0.4 mg under the tongue every 5 (five) minutes as needed for chest pain. ) 25 tablet 0  . rosuvastatin (CRESTOR) 10 MG tablet Take 1 tablet (10 mg total) by mouth daily. 90 tablet 3  . sertraline (ZOLOFT) 100 MG tablet Take 1 tablet (100 mg total) by mouth daily. 90 tablet 3  . tamsulosin (FLOMAX) 0.4 MG CAPS capsule Take 1 capsule by mouth daily (Patient taking differently: Take 0.4 mg by mouth daily. ) 90 capsule 3  . torsemide (DEMADEX) 20 MG tablet Take 3 tablets (60 mg total) by mouth 2 (two) times daily. 360 tablet 1   No current facility-administered medications on file prior to visit.    Allergies  Allergen Reactions  . Contrast Media [Iodinated Diagnostic Agents] Rash and Other (See Comments)    Got very hot and red   . Glipizide Other (See Comments)    ANTIDIABETICS. Burning  . Hydroxychloroquine Other (See Comments)    Stomach upset  . Metrizamide Other (See Comments)    Got very hot and red  . Penicillins Hives and Swelling    Has patient had a PCN reaction causing immediate rash, facial/tongue/throat swelling, SOB or lightheadedness with hypotension: yes Has patient had a PCN reaction causing severe rash involving mucus membranes or skin necrosis: no  Has patient had a PCN reaction that required hospitalization: yes Has patient had a PCN reaction occurring within the last 10 years: no If all of the above answers are "NO", then may proceed with Cephalosporin use.     Past Medical History:  Diagnosis Date  . 3-vessel CAD    8/30 LHC with radial graft to RCA occluded at the ostium with critical stenosis of the ostial native RCA.  . Adenomatous colon polyp   . Allergy   . Anemia   . Anxiety   . Arthritis    RA  . Asthma   . Bell's palsy 2007   neuropathy  . CAD (coronary artery disease)    03/21/2020 LHC with patent LIMA to LAD and SVG to OM 3.  Radial graft to RCA  occluded at the ostium with critical stenosis of the ostial and native RCA.  . Carotid artery occlusion    Bilateral carotid stenosis s/p left-sided CEA.  . Cataract    Dr. Dawna Part  . CKD (chronic kidney disease), stage IV ([place])    03/2020 vascular surgery access planned  . Depression   . Diabetes mellitus   . Diverticulosis   . Duodenitis   . DVT (deep venous thrombosis) ([facility])    in leg  . Dyspnea    with edema  . ESRD (end stage renal disease) ([place])   . Gastropathy   . GERD (gastroesophageal reflux disease)   . Heart failure with preserved ejection fraction ([place])  8/30 RHC with moderately to severely elevated filling pressures and pulmonary wedge pressure 31 mmHg, moderate pulmonary hypertension  . Heart murmur   . Helicobacter pylori gastritis   . Hx of CABG    2012  . Hyperlipidemia   . Hypertension   . Mitral regurgitation and aortic stenosis    RHC 8/30 and echo 12/2019.  [name] Myocardial infarction ([place]) 2021  . Neuropathy   . Obesity   . Peripheral vascular disease ([place])    S/p extensive revascularization by vascular surgery 05/2019  . Post splenectomy syndrome   . Psoriasis   . Severe aortic stenosis    03/21/20 RHC with peak gradient 27.4 mmHg and valve area 0.87.  [name] Sleep apnea    uses cpap  . Stroke ([place])   . Tobacco use disorder    recently quit  . Tubular adenoma 08/2013   Dr. Hilarie Fredrickson  . Weak urinary stream     Past Surgical History:  Procedure Laterality Date  . AV FISTULA PLACEMENT N/A 05/24/2020   Procedure: ARTERIOVENOUS (AV) FISTULA CREATION (Brachio-cephalic);  Surgeon: Algernon Huxley, MD;  Location: [facility] ORS;  Service: Vascular;  Laterality: N/A;  . CARDIAC CATHETERIZATION  08/2010   LAD: 80% ISR, RCA: 80% ostial, OM 80-90%  . CAROTID ENDARTERECTOMY  08/2010   left/ Dr. Kellie Simmering  . CATARACT EXTRACTION W/PHACO Left 07/09/2017   Procedure: CATARACT EXTRACTION PHACO AND INTRAOCULAR LENS PLACEMENT (IOC);  Surgeon: Birder Robson, MD;  Location: [facility] ORS;   Service: Ophthalmology;  Laterality: Left;  [place] 00:23 AP% 14.2 CDE 3.26 Fluid pack lot # [id number]  . CATARACT EXTRACTION W/PHACO Right 09/10/2017   Procedure: CATARACT EXTRACTION PHACO AND INTRAOCULAR LENS PLACEMENT (IOC);  Surgeon: Birder Robson, MD;  Location: [facility] ORS;  Service: Ophthalmology;  Laterality: Right;  [place] 00:26.4 AP% 17.3 CDE 4.59 Fluid Pack Lot # [id number]  . COLONOSCOPY    . CORONARY ANGIOPLASTY     LAD: before CABG  . CORONARY ARTERY BYPASS GRAFT  09/06/2010   At [facility]: LIMA to LAD, left radial to RCA, sequential SVG to OM3 and 4  . CORONARY STENT INTERVENTION N/A 03/23/2020   Procedure: CORONARY STENT INTERVENTION;  Surgeon: Wellington Hampshire, MD;  Location: [place] [facility];  Service: Cardiovascular;  Laterality: N/A;  RCA  . ESOPHAGOGASTRODUODENOSCOPY (EGD) WITH PROPOFOL N/A 04/11/2020   Procedure: ESOPHAGOGASTRODUODENOSCOPY (EGD) WITH PROPOFOL;  Surgeon: Lucilla Lame, MD;  Location: [facility] ENDOSCOPY;  Service: Endoscopy;  Laterality: N/A;  . heart stents  Jan 2011   leg stents 06/2009 and 03/2010  . LOWER EXTREMITY ANGIOGRAPHY Left 06/09/2019   Procedure: LOWER EXTREMITY ANGIOGRAPHY;  Surgeon: Katha Cabal, MD;  Location: [place] [facility];  Service: Cardiovascular;  Laterality: Left;  . PTA of illiac and SFA  multiple   Dr. Ronalee Belts, s/p revision 11/2012  . RIGHT AND LEFT HEART CATH N/A 03/21/2020   Procedure: RIGHT AND LEFT HEART CATH possible percutaneous intervention;  Surgeon: Wellington Hampshire, MD;  Location: [place] [facility];  Service: Cardiovascular;  Laterality: N/A;  . RIGHT/LEFT HEART CATH AND CORONARY/GRAFT ANGIOGRAPHY N/A 02/15/2020   Procedure: RIGHT/LEFT HEART CATH AND CORONARY/GRAFT ANGIOGRAPHY;  Surgeon: Wellington Hampshire, MD;  Location: [place] [facility];  Service: Cardiovascular;  Laterality: N/A;  . SPLENECTOMY    . VASCULAR SURGERY     LEG STENTS    Family History  Problem Relation Age of Onset  . Lung cancer Father 55  .  Arthritis Father   . Breast cancer  Sister 74  . Alcoholism Sister   . Dementia Mother   . Alzheimer's disease Mother   . Arthritis Mother   . Alcoholism Brother   . Epilepsy Sister   . Diabetes Paternal Grandfather   . Colon polyps Paternal Grandfather 66  . Alcoholism Sister   . Alcoholism Sister   . Alcoholism Brother     Social History   Socioeconomic History  . Marital status: Widowed    Spouse name: Not on file  . Number of children: 2  . Years of education: Not on file  . Highest education level: Not on file  Occupational History  . Occupation: Chief Operating Officer at [employer]: Disabled mostly due to neuropathy  Tobacco Use  . Smoking status: Former Smoker    Packs/day: 0.25    Years: 40.00    Pack years: 10.00    Types: Cigarettes  . Smokeless tobacco: Never Used  . Tobacco comment: 1 cigarette a day  Vaping Use  . Vaping Use: Never used  Substance and Sexual Activity  . Alcohol use: No  . Drug use: No  . Sexual activity: Never  Other Topics Concern  . Not on file  Social History Narrative   Wife died 01/27/19      Has living will   Gearldine Shown and his wife Levada Dy should be his health care POA   Would accept resuscitation --but no prolonged ventilation   No tube feeds if cognitively unaware   Social Determinants of Health   Financial Resource Strain: Low Risk   . Difficulty of Paying Living Expenses: Not very hard  Food Insecurity: No Food Insecurity  . Worried About Charity fundraiser in the Last Year: Never true  . Ran Out of Food in the Last Year: Never true  Transportation Needs: No Transportation Needs  . Lack of Transportation (Medical): No  . Lack of Transportation (Non-Medical): No  Physical Activity:   . Days of Exercise per Week: Not on file  . Minutes of Exercise per Session: Not on file  Stress: No Stress Concern Present  . Feeling of Stress : Only a little  Social Connections: Unknown  . Frequency of Communication with  Friends and Family: More than three times a week  . Frequency of Social Gatherings with Friends and Family: Not on file  . Attends Religious Services: Not on file  . Active Member of Clubs or Organizations: Not on file  . Attends Archivist Meetings: Not on file  . Marital Status: Not on file  Intimate Partner Violence: Unknown  . Fear of Current or Ex-Partner: No  . Emotionally Abused: No  . Physically Abused: No  . Sexually Abused: Not on file   Review of Systems  Back to smoking again---not daily but he is "in a deep dark depression" Glazes over at times---and just stares off at wall Can then hear people talking in his head---hasn't had this in over 15 years Gets spells of shaking Also has some paranoia---no suicidal/homicidal ideation All these symptoms are since stopping the wellbutrin (due to renal failure) Hard to eat--can't use his left hand. Not much appetite either     Objective:   Physical Exam Cardiovascular:     Rate and Rhythm: Normal rate and regular rhythm.     Heart sounds: No gallop.      Comments: Gr 2/6 coarse aortic systolic murmur Pulmonary:     Effort: Pulmonary effort is normal.  Breath sounds: No wheezing or rales.     Comments: Decreased breath sounds but clear Musculoskeletal:     Comments: No sig edema  Neurological:     Mental Status: He is alert.  Psychiatric:     Comments: Very depressed Some psychomotor retardation            Assessment & Plan:

## 2020-06-01 NOTE — Assessment & Plan Note (Signed)
Recent exacerbation and diuresed in hospital Now on higher torsemide dose Monitoring weight Dr Fletcher Anon is managing Needs definitive Rx for aortic stenosis

## 2020-06-01 NOTE — Telephone Encounter (Signed)
[name] [name], lab called a critical BUN - 87, results sent to Dr [name] and Dr Danise Mina

## 2020-06-02 ENCOUNTER — Emergency Department
Admission: EM | Admit: 2020-06-02 | Discharge: 2020-06-02 | Disposition: A | Discharge status: Home / Self Care | Payer: Medicare Other | Attending: Emergency Medicine | Admitting: Emergency Medicine

## 2020-06-02 ENCOUNTER — Other Ambulatory Visit: Payer: Self-pay

## 2020-06-02 DIAGNOSIS — I2510 Atherosclerotic heart disease of native coronary artery without angina pectoris: Secondary | ICD-10-CM | POA: Diagnosis not present

## 2020-06-02 DIAGNOSIS — J45909 Unspecified asthma, uncomplicated: Secondary | ICD-10-CM | POA: Insufficient documentation

## 2020-06-02 DIAGNOSIS — I132 Hypertensive heart and chronic kidney disease with heart failure and with stage 5 chronic kidney disease, or end stage renal disease: Secondary | ICD-10-CM | POA: Diagnosis not present

## 2020-06-02 DIAGNOSIS — Z951 Presence of aortocoronary bypass graft: Secondary | ICD-10-CM | POA: Diagnosis not present

## 2020-06-02 DIAGNOSIS — I5043 Acute on chronic combined systolic (congestive) and diastolic (congestive) heart failure: Secondary | ICD-10-CM | POA: Diagnosis not present

## 2020-06-02 DIAGNOSIS — E1122 Type 2 diabetes mellitus with diabetic chronic kidney disease: Secondary | ICD-10-CM | POA: Diagnosis not present

## 2020-06-02 DIAGNOSIS — Z79899 Other long term (current) drug therapy: Secondary | ICD-10-CM | POA: Insufficient documentation

## 2020-06-02 DIAGNOSIS — M25562 Pain in left knee: Secondary | ICD-10-CM | POA: Diagnosis not present

## 2020-06-02 DIAGNOSIS — Z7901 Long term (current) use of anticoagulants: Secondary | ICD-10-CM | POA: Diagnosis not present

## 2020-06-02 DIAGNOSIS — Z87891 Personal history of nicotine dependence: Secondary | ICD-10-CM | POA: Diagnosis not present

## 2020-06-02 DIAGNOSIS — N186 End stage renal disease: Secondary | ICD-10-CM | POA: Diagnosis not present

## 2020-06-02 DIAGNOSIS — N189 Chronic kidney disease, unspecified: Secondary | ICD-10-CM

## 2020-06-02 DIAGNOSIS — R0689 Other abnormalities of breathing: Secondary | ICD-10-CM | POA: Diagnosis not present

## 2020-06-02 DIAGNOSIS — Z794 Long term (current) use of insulin: Secondary | ICD-10-CM | POA: Diagnosis not present

## 2020-06-02 DIAGNOSIS — E1165 Type 2 diabetes mellitus with hyperglycemia: Secondary | ICD-10-CM | POA: Insufficient documentation

## 2020-06-02 DIAGNOSIS — R200 Anesthesia of skin: Secondary | ICD-10-CM | POA: Insufficient documentation

## 2020-06-02 DIAGNOSIS — G8918 Other acute postprocedural pain: Secondary | ICD-10-CM | POA: Insufficient documentation

## 2020-06-02 DIAGNOSIS — R202 Paresthesia of skin: Secondary | ICD-10-CM | POA: Diagnosis not present

## 2020-06-02 DIAGNOSIS — R0902 Hypoxemia: Secondary | ICD-10-CM | POA: Diagnosis not present

## 2020-06-02 DIAGNOSIS — I10 Essential (primary) hypertension: Secondary | ICD-10-CM | POA: Diagnosis not present

## 2020-06-02 DIAGNOSIS — R739 Hyperglycemia, unspecified: Secondary | ICD-10-CM

## 2020-06-02 DIAGNOSIS — I120 Hypertensive chronic kidney disease with stage 5 chronic kidney disease or end stage renal disease: Secondary | ICD-10-CM | POA: Diagnosis not present

## 2020-06-02 LAB — COMPREHENSIVE METABOLIC PANEL
ALT: 20 U/L (ref 0–44)
AST: 21 U/L (ref 15–41)
Albumin: 3.3 g/dL — ABNORMAL LOW (ref 3.5–5.0)
Alkaline Phosphatase: 80 U/L (ref 38–126)
Anion gap: 12 (ref 5–15)
BUN: 93 mg/dL — ABNORMAL HIGH (ref 8–23)
CO2: 27 mmol/L (ref 22–32)
Calcium: 8.2 mg/dL — ABNORMAL LOW (ref 8.9–10.3)
Chloride: 93 mmol/L — ABNORMAL LOW (ref 98–111)
Creatinine, Ser: 3.61 mg/dL — ABNORMAL HIGH (ref 0.61–1.24)
GFR, Estimated: 18 mL/min — ABNORMAL LOW (ref >60)
Glucose, Bld: 208 mg/dL — ABNORMAL HIGH (ref 70–99)
Potassium: 3.6 mmol/L (ref 3.5–5.1)
Sodium: 132 mmol/L — ABNORMAL LOW (ref 135–145)
Total Bilirubin: 0.7 mg/dL (ref 0.3–1.2)
Total Protein: 7.9 g/dL (ref 6.5–8.1)

## 2020-06-02 LAB — CBC
HCT: 25.4 % — ABNORMAL LOW (ref 39.0–52.0)
Hemoglobin: 7.9 g/dL — ABNORMAL LOW (ref 13.0–17.0)
MCH: 29.3 pg (ref 26.0–34.0)
MCHC: 31.1 g/dL (ref 30.0–36.0)
MCV: 94.1 fL (ref 80.0–100.0)
Platelets: 225 10*3/uL (ref 150–400)
RBC: 2.7 MIL/uL — ABNORMAL LOW (ref 4.22–5.81)
RDW: 16.5 % — ABNORMAL HIGH (ref 11.5–15.5)
WBC: 9.1 10*3/uL (ref 4.0–10.5)
nRBC: 0 % (ref 0.0–0.2)

## 2020-06-02 NOTE — ED Triage Notes (Signed)
Pt in with co left arm pain and numbness since having AV fistula placed last week for dialysis. Has called Dr. Lucky Cowboy but no tests have been ordered.

## 2020-06-02 NOTE — ED Notes (Signed)
Spoke with Dr. Corky Downs about symptoms and protocols done as ordered.

## 2020-06-02 NOTE — ED Provider Notes (Signed)
W.G. ([name] [name]) [name] ([name] [name]) Emergency Department Provider Note  ____________________________________________   First MD Initiated Contact with Patient 06/02/20 2019     (approximate)  I have reviewed the triage vital signs and the nursing notes.   HISTORY  Chief Complaint Post-op Problem   HPI [name] [name] is a 64 y.o. male with medical history significantforcoronary artery disease status post three-vessel CABG, status post recent PCI with stent angioplastyof the ostial right coronary artery, history of diabetesmellitus withstage IV chronic kidney disease, severe ASand hypertension s/p placement of LUE AV fistula on 11/2 and suspicion of starting dialysis soon who presents for assessment of some persistent pain and numbness in his left forearm since the procedure.  Patient endorses some pain in his left knee as well which he states has been bothering him some and this has been going on for several years and is not getting better with Tylenol.  He denies any other acute symptoms including fevers, chills, headache, earache, sore throat, chest pain, cough, shortness of breath, vomiting, diarrhea, dysuria, other extremity pain, or other acute complaints.  He denies any traumatic injuries.  States he is compliant with all his medications.   Past Medical History:  Diagnosis Date  . 3-vessel CAD    8/30 LHC with radial graft to RCA occluded at the ostium with critical stenosis of the ostial native RCA.  . Adenomatous colon polyp   . Allergy   . Anemia   . Anxiety   . Arthritis    RA  . Asthma   . Bell's palsy 2007   neuropathy  . CAD (coronary artery disease)    03/21/2020 LHC with patent LIMA to LAD and SVG to OM 3.  Radial graft to RCA occluded at the ostium with critical stenosis of the ostial and native RCA.  . Carotid artery occlusion    Bilateral carotid stenosis s/p left-sided CEA.  . Cataract    Dr. Dawna [name]  . CKD (chronic kidney disease), stage IV ([name] [name])     03/2020 vascular surgery access planned  . Depression   . Diabetes mellitus   . Diverticulosis   . Duodenitis   . DVT (deep venous thrombosis) (HCC)    in leg  . Dyspnea    with edema  . ESRD (end stage renal disease) ([name] [name])   . Gastropathy   . GERD (gastroesophageal reflux disease)   . Heart failure with preserved ejection fraction ([name] [name])    8/30 RHC with moderately to severely elevated filling pressures and pulmonary wedge pressure 31 mmHg, moderate pulmonary hypertension  . Heart murmur   . Helicobacter pylori gastritis   . Hx of CABG    2012  . Hyperlipidemia   . Hypertension   . Mitral regurgitation and aortic stenosis    RHC 8/30 and echo 12/2019.  [name] Myocardial infarction ([name] [name]) 2021  . Neuropathy   . Obesity   . Peripheral vascular disease ([name] [name])    S/p extensive revascularization by vascular surgery 05/2019  . Post splenectomy syndrome   . Psoriasis   . Severe aortic stenosis    03/21/20 RHC with peak gradient 27.4 mmHg and valve area 0.87.  [name] Sleep apnea    uses cpap  . Stroke ([name] [name])   . Tobacco use disorder    recently quit  . Tubular adenoma 08/2013   Dr. Hilarie [name]  . Weak urinary stream     Patient Active Problem List   Diagnosis Date Noted  . Acute on chronic combined systolic (congestive) and  diastolic (congestive) heart failure ([name] [name]) 05/20/2020  . Demand ischemia ([name] [name])   . Acute blood loss anemia 04/09/2020  . Chronic combined systolic and diastolic CHF (congestive heart failure) ([name] [name]) 04/09/2020  . CKD (chronic kidney disease), stage IV ([name] [name]) 04/09/2020  . Depression 04/09/2020  . Aortic stenosis 04/09/2020  . Elevated troponin I level 04/09/2020  . Anemia in chronic kidney disease 04/07/2020  . Ischemic cardiomyopathy 03/25/2020  . Non-ST elevation (NSTEMI) myocardial infarction ([name] [name]) 03/23/2020  . CAD (coronary artery disease) 03/22/2020  . Chronic heart failure with preserved ejection fraction (HFpEF) ([name] [name]) 03/21/2020  . Hx of CABG 03/21/2020  . Anemia  03/21/2020  . Unstable angina ([name] [name]) 03/20/2020  . Chronic kidney disease   . History of non-ST elevation myocardial infarction (NSTEMI) 02/13/2020  . Moderate aortic valve stenosis 02/13/2020  . Acute on chronic combined systolic and diastolic CHF (congestive heart failure) ([name] [name]) 02/13/2020  . Hypertensive urgency 02/13/2020  . Fall with injury 11/16/2019  . Leg weakness, bilateral 11/16/2019  . Benign hypertensive kidney disease with chronic kidney disease 09/02/2019  . Proteinuria 09/02/2019  . Secondary hyperparathyroidism of renal origin ([name] [name]) 09/02/2019  . Left arm pain 02/11/2019  . Gastroesophageal reflux 03/25/2018  . Atherosclerosis of aorta ([name] [name]) 09/04/2017  . Thoracic aortic aneurysm ([place]) 09/04/2017  . Right thyroid nodule 04/12/2017  . Thrombocytopenia ([place]) 03/16/2017  . CKD stage 4 due to type 2 diabetes mellitus ([name] [name]) 03/15/2017  . Advance directive discussed with patient 03/15/2017  . BMI 40.0-44.9, adult ([name] [name]) 09/12/2016  . Psoriatic arthritis ([name] [name])   . Lung nodule < 6cm on CT 03/29/2016  . COPD with acute bronchitis ([name] [name]) 03/29/2016  . Headache 09/14/2015  . Constipation 10/05/2014  . PVD (peripheral vascular disease) ([name] [name]) 04/23/2014  . Memory loss 04/23/2014  . Preventative health care 11/23/2013  . Benign prostatic hypertrophy without urinary obstruction 03/25/2013  . Enlarged prostate without lower urinary tract symptoms (luts) 03/25/2013  . Peripheral vascular disease due to secondary diabetes mellitus ([name] [name]) 03/25/2013  . Obesity 02/12/2013  . Atherosclerotic heart disease of native coronary artery with angina pectoris ([name] [name]) 06/06/2012  . DM (diabetes mellitus), type 2 with complications ([place]) 87/86/7672  . Severe aortic stenosis   . Low back pain 12/27/2011  . Bipolar disorder ([name] [name]) 05/28/2011  . Carotid artery disease ([name] [name]) 04/17/2011  . Occlusion and stenosis of carotid artery 04/17/2011  . Obstructive sleep apnea 03/05/2011  . Psoriasis 03/05/2011    . Type 2 diabetes mellitus with diabetic peripheral angiopathy without gangrene ([place]) 03/05/2011  . Polyneuropathy 03/05/2011  . 3-vessel CAD   . Hyperlipidemia, mixed   . Essential hypertension     Past Surgical History:  Procedure Laterality Date  . AV FISTULA PLACEMENT N/A 05/24/2020   Procedure: ARTERIOVENOUS (AV) FISTULA CREATION (Brachio-cephalic);  Surgeon: Algernon Huxley, MD;  Location: [facility];  Service: Vascular;  Laterality: N/A;  . CARDIAC CATHETERIZATION  08/2010   LAD: 80% ISR, RCA: 80% ostial, OM 80-90%  . CAROTID ENDARTERECTOMY  08/2010   left/ Dr. Kellie Simmering  . CATARACT EXTRACTION W/PHACO Left 07/09/2017   Procedure: CATARACT EXTRACTION PHACO AND INTRAOCULAR LENS PLACEMENT (IOC);  Surgeon: Birder Robson, MD;  Location: [facility];  Service: Ophthalmology;  Laterality: Left;  [place] 00:23 AP% 14.2 CDE 3.26 Fluid pack lot # [id number]  . CATARACT EXTRACTION W/PHACO Right 09/10/2017   Procedure: CATARACT EXTRACTION PHACO AND INTRAOCULAR LENS PLACEMENT (IOC);  Surgeon: Birder Robson, MD;  Location: [facility];  Service: Ophthalmology;  Laterality: Right;  [place] 00:26.4 AP% 17.3  CDE 4.59 Fluid Pack Lot # [id number]  . COLONOSCOPY    . CORONARY ANGIOPLASTY     LAD: before CABG  . CORONARY ARTERY BYPASS GRAFT  09/06/2010   At [facility]: LIMA to LAD, left radial to RCA, sequential SVG to OM3 and 4  . CORONARY STENT INTERVENTION N/A 03/23/2020   Procedure: CORONARY STENT INTERVENTION;  Surgeon: Wellington Hampshire, MD;  Location: [place] [facility];  Service: Cardiovascular;  Laterality: N/A;  RCA  . ESOPHAGOGASTRODUODENOSCOPY (EGD) WITH PROPOFOL N/A 04/11/2020   Procedure: ESOPHAGOGASTRODUODENOSCOPY (EGD) WITH PROPOFOL;  Surgeon: Lucilla Lame, MD;  Location[name];  Service: Endoscopy;  Laterality: N/A;  . heart stents  Jan 2011   leg stents 06/2009 and 03/2010  . LOWER EXTREMITY ANGIOGRAPHY Left 06/09/2019   Procedure: LOWER EXTREMITY ANGIOGRAPHY;  Surgeon: Katha Cabal, MD;   Location: [place] [facility];  Service: Cardiovascular;  Laterality: Left;  . PTA of illiac and SFA  multiple   Dr. Ronalee Belts, s/p revision 11/2012  . RIGHT AND LEFT HEART CATH N/A 03/21/2020   Procedure: RIGHT AND LEFT HEART CATH possible percutaneous intervention;  Surgeon: Wellington Hampshire, MD;  Location: [place] [facility];  Service: Cardiovascular;  Laterality: N/A;  . RIGHT/LEFT HEART CATH AND CORONARY/GRAFT ANGIOGRAPHY N/A 02/15/2020   Procedure: RIGHT/LEFT HEART CATH AND CORONARY/GRAFT ANGIOGRAPHY;  Surgeon: Wellington Hampshire, MD;  Location: [place] [facility];  Service: Cardiovascular;  Laterality: N/A;  . SPLENECTOMY    . VASCULAR SURGERY     LEG STENTS    Prior to Admission medications   Medication Sig Start Date End Date Taking? Authorizing Provider  acetaminophen (TYLENOL) 500 MG tablet Take 500 mg by mouth every 6 (six) hours as needed for moderate pain or headache.    [provider]  albuterol (PROAIR HFA) 108 (90 Base) MCG/ACT inhaler Inhale 2 puffs into the lungs every 6 (six) hours as needed for shortness of breath. 06/01/20   [name], MD  alum & mag hydroxide-simeth (MAALOX/MYLANTA) 200-200-20 MG/5ML suspension Take 30 mLs by mouth every 6 (six) hours as needed for indigestion or heartburn.    [provider]  ARIPiprazole (ABILIFY) 2 MG tablet Take 1 tablet (2 mg total) by mouth at bedtime. 06/01/20   [name], MD  B Complex-C (B-COMPLEX WITH VITAMIN C) tablet Take 1 tablet by mouth daily. 05/27/20   Nicole Kindred A, DO  busPIRone (BUSPAR) 10 MG tablet TAKE 2 TABLETS(20 MG) BY MOUTH TWICE DAILY Patient taking differently: Take 20 mg by mouth 2 (two) times daily.  02/19/20   [name], MD  calcitRIOL (ROCALTROL) 0.25 MCG capsule Take 0.25 mcg by mouth every Monday, Wednesday, and Friday.     [provider]  carvedilol (COREG) 3.125 MG tablet Take 1 tablet (3.125 mg total) by mouth 2 (two) times daily with a meal.  05/26/20   Nicole Kindred A, DO  clopidogrel (PLAVIX) 75 MG tablet Take 1 tablet (75 mg total) by mouth daily. 03/25/20   Sande Rives E, PA-C  CONTOUR TEST test strip USE TO check blood sugar ONCE DAILY AS DIRECTED 05/10/20   Philemon Kingdom, MD  Dextran 70-Hypromellose (ARTIFICIAL TEARS) 0.1-0.3 % SOLN Place 1 drop into both eyes 4 (four) times daily as needed (dry eyes).    [provider]  feeding supplement (ENSURE ENLIVE / ENSURE PLUS) LIQD Take 237 mLs by mouth daily. 05/27/20   Nicole Kindred A, DO  finasteride (PROSCAR) 5 MG tablet Take  5 mg by mouth daily.    [provider]  insulin aspart (NOVOLOG) 100 UNIT/ML injection Inject 0-9 Units into the skin 3 (three) times daily with meals. 03/21/20   Nicole Kindred A, DO  insulin regular (NOVOLIN R) 100 units/mL injection Inject 0.1-0.25 mLs (10-25 Units total) into the skin daily as needed (high blood sugar over 235). 05/19/20   [name], MD  Insulin Syringe-Needle U-100 30G X 15/64" 1 ML MISC Use to inject insulin as directed 05/19/20   [name], MD  isosorbide mononitrate (IMDUR) 30 MG 24 hr tablet Take 1 tablet (30 mg total) by mouth daily. 05/19/20   Wellington Hampshire, MD  nitroGLYCERIN (NITROSTAT) 0.4 MG SL tablet DISSOLVE 1 TABLET UNDER THE TONGUE EVERY 5 MINUTES AS NEEDED FOR CHEST PAIN. DO NOT EXCEED A TOTAL OF 3 DOSES IN 15 MINUTES. Patient taking differently: Place 0.4 mg under the tongue every 5 (five) minutes as needed for chest pain.  04/18/20   Dunn, Areta Haber, PA-C  rosuvastatin (CRESTOR) 10 MG tablet Take 1 tablet (10 mg total) by mouth daily. 04/25/20   Wellington Hampshire, MD  sertraline (ZOLOFT) 100 MG tablet Take 1 tablet (100 mg total) by mouth daily. 02/23/20   [name], MD  tamsulosin (FLOMAX) 0.4 MG CAPS capsule Take 1 capsule by mouth daily Patient taking differently: Take 0.4 mg by mouth daily.  01/20/20   [name], MD  torsemide (DEMADEX) 20 MG tablet Take 3 tablets (60  mg total) by mouth 2 (two) times daily. 05/26/20   Ezekiel Slocumb, DO    Allergies Contrast media [iodinated diagnostic agents], Glipizide, Hydroxychloroquine, Metrizamide, and Penicillins  Family History  Problem Relation Age of Onset  . Lung cancer Father 28  . Arthritis Father   . Breast cancer Sister 31  . Alcoholism Sister   . Dementia Mother   . Alzheimer's disease Mother   . Arthritis Mother   . Alcoholism Brother   . Epilepsy Sister   . Diabetes Paternal Grandfather   . Colon polyps Paternal Grandfather 8  . Alcoholism Sister   . Alcoholism Sister   . Alcoholism Brother     Social History Social History   Tobacco Use  . Smoking status: Former Smoker    Packs/day: 0.25    Years: 40.00    Pack years: 10.00    Types: Cigarettes  . Smokeless tobacco: Never Used  . Tobacco comment: 1 cigarette a day  Vaping Use  . Vaping Use: Never used  Substance Use Topics  . Alcohol use: No  . Drug use: No    Review of Systems  Review of Systems  Constitutional: Negative for chills and fever.  HENT: Negative for sore throat.   Eyes: Negative for pain.  Respiratory: Negative for cough and stridor.   Cardiovascular: Negative for chest pain.  Gastrointestinal: Negative for vomiting.  Genitourinary: Negative for dysuria.  Musculoskeletal: Positive for joint pain ( chronic in L knee) and myalgias ( L arm).  Skin: Negative for rash.  Neurological: Negative for seizures, loss of consciousness and headaches.  Psychiatric/Behavioral: Negative for suicidal ideas.  All other systems reviewed and are negative.     ____________________________________________   PHYSICAL EXAM:  VITAL SIGNS: ED Triage Vitals  Enc Vitals Group     BP 06/02/20 1910 132/67     Pulse Rate 06/02/20 1910 (!) 107     Resp 06/02/20 1910 (!) 24     Temp 06/02/20 1910  97.6 F (36.4 C)     Temp Source 06/02/20 1910 Oral     SpO2 06/02/20 1910 100 %     Weight 06/02/20 1912 225 lb (102.1 kg)       Height 06/02/20 1912 5\' 6"  (1.676 m)     Head Circumference --      Peak Flow --      Pain Score 06/02/20 1911 10     Pain Loc --      Pain Edu? --      Excl. in [place]? --    Vitals:   06/02/20 1910  BP: 132/67  Pulse: (!) 107  Resp: (!) 24  Temp: 97.6 F (36.4 C)  SpO2: 100%   Physical Exam Vitals and nursing note reviewed.  Constitutional:      Appearance: He is well-developed.  HENT:     Head: Normocephalic and atraumatic.     Right Ear: External ear normal.     Left Ear: External ear normal.     Nose: Nose normal.  Eyes:     Conjunctiva/sclera: Conjunctivae normal.  Cardiovascular:     Rate and Rhythm: Regular rhythm.     Heart sounds: No murmur heard.   Pulmonary:     Effort: Pulmonary effort is normal. No respiratory distress.     Breath sounds: Normal breath sounds.  Abdominal:     Palpations: Abdomen is soft.     Tenderness: There is no abdominal tenderness.  Musculoskeletal:     Cervical back: Neck supple.  Skin:    General: Skin is warm and dry.  Neurological:     Mental Status: He is alert and oriented to person, place, and time.  Psychiatric:        Mood and Affect: Mood normal.     Left knee has no large effusion has full range of motion.  Sensation is intact distally is a 2+ posterior tibialis pulse.  Patient has decreased sensation in the distribution of the radial ulnar and median nerves in the left upper extremity.  Sensation returns at the proximal forearm and the distribution of the radial and median nerves.  A mild to palpate a radial pulse patient's left hand is cooler than his right.  His incision in the ventral aspect appears clean dry and intact.  There is no bleeding drainage or other significant skin changes. ____________________________________________   LABS (all labs ordered are listed, but only abnormal results are displayed)  Labs Reviewed  CBC - Abnormal; Notable for the following components:      Result Value   RBC 2.70 (*)     Hemoglobin 7.9 (*)    HCT 25.4 (*)    RDW 16.5 (*)    All other components within normal limits  COMPREHENSIVE METABOLIC PANEL - Abnormal; Notable for the following components:   Sodium 132 (*)    Chloride 93 (*)    Glucose, Bld 208 (*)    BUN 93 (*)    Creatinine, Ser 3.61 (*)    Calcium 8.2 (*)    Albumin 3.3 (*)    GFR, Estimated 18 (*)    All other components within normal limits   ____________________________________________  ____________________________________________   PROCEDURES  Procedure(s) performed (including Critical Care):  Procedures   ____________________________________________   INITIAL IMPRESSION / ASSESSMENT AND PLAN / ED COURSE        Patient presents with above to history exam for assessment of some paresthesias and numbness in his left upper extremity after recent  AV fistula placement in anticipation of starting dialysis.  Patient is also complaining of some chronic pain in his left knee.  On presentation he is slightly tachycardic and tachypneic upon her triage vitals along my assessment patient's heart rate is noted to be in the 90s and his respiratory rate is 16.  Regard to patient's left knee pain is appears to be chronic and there are no recent history of traumatic injuries fevers or change in her chronic pain.  Patient is neurovascular intact distally to the knee and there are no overlying skin changes such as warmth redness focal tenderness or evidence of effusion to suggest an acute infectious process or acute noninflammatory arthritis such as acute gout flare.  I suspect patient symptoms are secondary to some chronic arthritic pain in the knee.  With regard to patient's numbness in his left upper extremity I suspect he is likely diverting poor arterial flow than intended to his venous circulation as I am unable to palpate a radial artery pulse.  However able to auscultate his fistula and here blood flow clearly.  In addition patient's skin  is warm and pink and appears well-perfused proximal to the fistula.  Incision site does not appear infected.  There is no history or findings to suggest trauma.  I did discuss patient's presentation and exam with on-call vascular surgeon Dr. Evelina Bucy who stated given patient symptoms have been present since this procedure and were not acute in the last 24 hours there is no indication for acute imaging or anticoagulation as patient should follow-up with Dr. Lucky Cowboy in clinic tomorrow.    CBC remarkable for evidence of anemia with a hemoglobin of 7.9 which is very close to baseline of hemoglobin of 8.510 days ago.  No blood cells and platelets are unremarkable.  CMP obtained shows some mild hyperglycemia consistent with patient's known history of diabetes as well as evidence of patient's known CKD with elevated BUN/creatinine.  No other significant derangements.  Given stable vital signs with no acute change patient symptoms to his left arm unlikely distal hypoperfusion greater than 24 hours and plan for close outpatient follow-up I believe is reasonable for patient to be discharged.  Discharge stable condition per strict return cautions advised discussed.   ____________________________________________   FINAL CLINICAL IMPRESSION(S) / ED DIAGNOSES  Final diagnoses:  Post-operative pain  Chronic kidney disease, unspecified CKD stage  Hyperglycemia  Extremity numbness    Medications - No data to display   ED Discharge Orders    None       Note:  This document was prepared using Dragon voice recognition software and may include unintentional dictation errors.   Lucrezia Starch, MD 06/02/20 2051

## 2020-06-02 NOTE — ED Notes (Addendum)
First Nurse Note;   Pt in via [facility] from home w/ complaints of pain and numbness to left arm and hand x 1 week ago since new left arm fistula placement.    CBG 308; denies having insulin today.  20G PIV to right AC upon arrival.   NAD noted at this time.

## 2020-06-02 NOTE — Telephone Encounter (Signed)
Yes--renal function is stable. The plan is in place to start dialysis soon

## 2020-06-02 NOTE — ED Notes (Signed)
Patient ready to go, refused revitaling. Refused signing.

## 2020-06-03 ENCOUNTER — Telehealth (INDEPENDENT_AMBULATORY_CARE_PROVIDER_SITE_OTHER): Payer: Self-pay

## 2020-06-03 ENCOUNTER — Ambulatory Visit (INDEPENDENT_AMBULATORY_CARE_PROVIDER_SITE_OTHER): Payer: Medicare Other | Admitting: Vascular Surgery

## 2020-06-03 ENCOUNTER — Other Ambulatory Visit
Admission: RE | Admit: 2020-06-03 | Discharge: 2020-06-03 | Disposition: A | Discharge status: Home / Self Care | Payer: Medicare Other | Source: Ambulatory Visit | Attending: Vascular Surgery | Admitting: Vascular Surgery

## 2020-06-03 ENCOUNTER — Encounter (INDEPENDENT_AMBULATORY_CARE_PROVIDER_SITE_OTHER): Payer: Self-pay | Admitting: Vascular Surgery

## 2020-06-03 VITALS — BP 124/70 | HR 105 | Resp 16 | Wt 231.0 lb

## 2020-06-03 DIAGNOSIS — Z01812 Encounter for preprocedural laboratory examination: Secondary | ICD-10-CM | POA: Insufficient documentation

## 2020-06-03 DIAGNOSIS — Z20822 Contact with and (suspected) exposure to covid-19: Secondary | ICD-10-CM | POA: Insufficient documentation

## 2020-06-03 DIAGNOSIS — N184 Chronic kidney disease, stage 4 (severe): Secondary | ICD-10-CM

## 2020-06-03 DIAGNOSIS — E1122 Type 2 diabetes mellitus with diabetic chronic kidney disease: Secondary | ICD-10-CM

## 2020-06-03 DIAGNOSIS — I10 Essential (primary) hypertension: Secondary | ICD-10-CM

## 2020-06-03 DIAGNOSIS — I739 Peripheral vascular disease, unspecified: Secondary | ICD-10-CM

## 2020-06-03 LAB — SARS CORONAVIRUS 2 (TAT 6-24 HRS): SARS Coronavirus 2: NEGATIVE

## 2020-06-03 NOTE — Telephone Encounter (Signed)
Patient was seen in office today and scheduled for a left arm angio with Dr. Lucky Cowboy for 06/06/20 with a 1:00 pm arrival time to the [facility]. Covid testing was today 06/03/20 at the [facility]. Pre-procedure instructions were discussed and the patient was asked to write down this information. I went through [facility], RN at the hospital for anesthesia for the procedure on 06/06/20, I was given 2:00 pm as first available.

## 2020-06-03 NOTE — Progress Notes (Signed)
MRN : [id number]  [name] [name] is a 64 y.o. (May 02, 1956) male who presents with chief complaint of No chief complaint on file. [name] [name]  History of Present Illness: Patient returns today in follow up of his left arm pain after fistula placement a week ago.  He has been having pain in his hand and fingers as well as numbness since shortly after the procedure.  No open wounds although he said he did have some drainage from the left index finger.  He had already had a radial artery harvest on that side and his perfusion appears to be diminished after fistula creation.  Current Outpatient Medications Medication Sig Dispense Refill . acetaminophen (TYLENOL) 500 MG tablet Take 500 mg by mouth every 6 (six) hours as needed for moderate pain or headache.   . albuterol (PROAIR HFA) 108 (90 Base) MCG/ACT inhaler Inhale 2 puffs into the lungs every 6 (six) hours as needed for shortness of breath. 18 g 3 . alum & mag hydroxide-simeth (MAALOX/MYLANTA) 200-200-20 MG/5ML suspension Take 30 mLs by mouth every 6 (six) hours as needed for indigestion or heartburn.   . ARIPiprazole (ABILIFY) 2 MG tablet Take 1 tablet (2 mg total) by mouth at bedtime. 30 tablet 1 . B Complex-C (B-COMPLEX WITH VITAMIN C) tablet Take 1 tablet by mouth daily. 30 tablet 1 . busPIRone (BUSPAR) 10 MG tablet TAKE 2 TABLETS(20 MG) BY MOUTH TWICE DAILY (Patient taking differently: Take 20 mg by mouth 2 (two) times daily. ) 360 tablet 3 . calcitRIOL (ROCALTROL) 0.25 MCG capsule Take 0.25 mcg by mouth every Monday, Wednesday, and Friday.    . carvedilol (COREG) 3.125 MG tablet Take 1 tablet (3.125 mg total) by mouth 2 (two) times daily with a meal. 60 tablet 2 . clopidogrel (PLAVIX) 75 MG tablet Take 1 tablet (75 mg total) by mouth daily. 30 tablet 11 . CONTOUR TEST test strip USE TO check blood sugar ONCE DAILY AS DIRECTED 100 strip 9 . Dextran 70-Hypromellose (ARTIFICIAL TEARS) 0.1-0.3 % SOLN Place 1 drop into both eyes 4 (four) times daily  as needed (dry eyes).   . feeding supplement (ENSURE ENLIVE / ENSURE PLUS) LIQD Take 237 mLs by mouth daily. 237 mL 12 . finasteride (PROSCAR) 5 MG tablet Take 5 mg by mouth daily.   . insulin aspart (NOVOLOG) 100 UNIT/ML injection Inject 0-9 Units into the skin 3 (three) times daily with meals. 10 mL 11 . insulin regular (NOVOLIN R) 100 units/mL injection Inject 0.1-0.25 mLs (10-25 Units total) into the skin daily as needed (high blood sugar over 235). 10 mL 11 . Insulin Syringe-Needle U-100 30G X 15/64" 1 ML MISC Use to inject insulin as directed 200 each 11 . isosorbide mononitrate (IMDUR) 30 MG 24 hr tablet Take 1 tablet (30 mg total) by mouth daily. 30 tablet 0 . nitroGLYCERIN (NITROSTAT) 0.4 MG SL tablet DISSOLVE 1 TABLET UNDER THE TONGUE EVERY 5 MINUTES AS NEEDED FOR CHEST PAIN. DO NOT EXCEED A TOTAL OF 3 DOSES IN 15 MINUTES. (Patient taking differently: Place 0.4 mg under the tongue every 5 (five) minutes as needed for chest pain. ) 25 tablet 0 . rosuvastatin (CRESTOR) 10 MG tablet Take 1 tablet (10 mg total) by mouth daily. 90 tablet 3 . sertraline (ZOLOFT) 100 MG tablet Take 1 tablet (100 mg total) by mouth daily. 90 tablet 3 . tamsulosin (FLOMAX) 0.4 MG CAPS capsule Take 1 capsule by mouth daily (Patient taking differently: Take 0.4 mg by mouth daily. ) 90 capsule  3 . torsemide (DEMADEX) 20 MG tablet Take 3 tablets (60 mg total) by mouth 2 (two) times daily. 360 tablet 1  No current facility-administered medications for this visit.   Past Medical History: Diagnosis Date . 3-vessel CAD   8/30 LHC with radial graft to RCA occluded at the ostium with critical stenosis of the ostial native RCA. . Adenomatous colon polyp  . Allergy  . Anemia  . Anxiety  . Arthritis   RA . Asthma  . Bell's palsy 2007  neuropathy . CAD (coronary artery disease)   03/21/2020 LHC with patent LIMA to LAD and SVG to OM 3.  Radial graft to RCA occluded at the ostium with critical stenosis of the ostial  and native RCA. . Carotid artery occlusion   Bilateral carotid stenosis s/p left-sided CEA. . Cataract   [name] [name] . CKD (chronic kidney disease), stage IV ([place])   03/2020 vascular surgery access planned . Depression  . Diabetes mellitus  . Diverticulosis  . Duodenitis  . DVT (deep venous thrombosis) ([facility])   in leg . Dyspnea   with edema . ESRD (end stage renal disease) ([place])  . Gastropathy  . GERD (gastroesophageal reflux disease)  . Heart failure with preserved ejection fraction ([place])   8/30 RHC with moderately to severely elevated filling pressures and pulmonary wedge pressure 31 mmHg, moderate pulmonary hypertension . Heart murmur  . Helicobacter pylori gastritis  . Hx of CABG   2012 . Hyperlipidemia  . Hypertension  . Mitral regurgitation and aortic stenosis   RHC 8/30 and echo 12/2019. [name] [name] Myocardial infarction ([place]) 2021 . Neuropathy  . Obesity  . Peripheral vascular disease ([place])   S/p extensive revascularization by vascular surgery 05/2019 . Post splenectomy syndrome  . Psoriasis  . Severe aortic stenosis   03/21/20 RHC with peak gradient 27.4 mmHg and valve area 0.87. [name] [name] Sleep apnea   uses cpap . Stroke ([place])  . Tobacco use disorder   recently quit . Tubular adenoma 08/2013  [name] [name] . Weak urinary stream    Past Surgical History: Procedure Laterality Date . AV FISTULA PLACEMENT N/A 05/24/2020  Procedure: ARTERIOVENOUS (AV) FISTULA CREATION (Brachio-cephalic);  Surgeon: Algernon Huxley, MD;  Location: [facility] ORS;  Service: Vascular;  Laterality: N/A; . CARDIAC CATHETERIZATION  08/2010  LAD: 80% ISR, RCA: 80% ostial, OM 80-90% . CAROTID ENDARTERECTOMY  08/2010  left/ [name] [name] . CATARACT EXTRACTION W/PHACO Left 07/09/2017  Procedure: CATARACT EXTRACTION PHACO AND INTRAOCULAR LENS PLACEMENT (IOC);  Surgeon: [name] Robson, MD;  Location: [facility] OR[facility];  Service: Ophthalmology;  Laterality: Left;  [place] 00:23 AP% 14.2 CDE 3.26 Fluid pack lot # [id number] . CATARACT  EXTRACTION W/PHACO Right 09/10/2017  Procedure: CATARACT EXTRACTION PHACO AND INTRAOCULAR LENS PLACEMENT (IOC);  Surgeon: [name] Robson, MD;  Location: [facility];  Service: Ophthalmology;  Laterality: Right;  [place] 00:26.4 AP% 17.3 CDE 4.59 Fluid Pack Lot # [id number] . COLONOSCOPY   . CORONARY ANGIOPLASTY    LAD: before CABG . CORONARY ARTERY BYPASS GRAFT  09/06/2010  At [facility]: LIMA to LAD, left radial to RCA, sequential SVG to OM3 and 4 . CORONARY STENT INTERVENTION N/A 03/23/2020  Procedure: CORONARY STENT INTERVENTION;  Surgeon: Wellington [place], MD;  Location: [facility];  Service: Cardiovascular;  Laterality: N/A;  RCA . ESOPHAGOGASTRODUODENOSCOPY (EGD) WITH PROPOFOL N/A 04/11/2020  Procedure: ESOPHAGOGASTRODUODENOSCOPY (EGD) WITH PROPOFOL;  Surgeon: Lucilla Lame, MD;  Location: [facility] ENDOSCOPY;  Service: Endoscopy;  Laterality: N/A; . heart stents  Jan 2011  leg  stents 06/2009 and 03/2010 . LOWER EXTREMITY ANGIOGRAPHY Left 06/09/2019  Procedure: LOWER EXTREMITY ANGIOGRAPHY;  Surgeon: Katha Cabal, MD;  Location: [facility];  Service: Cardiovascular;  Laterality: Left; . PTA of illiac and SFA  multiple  Dr. Ronalee Belts, s/p revision 11/2012 . RIGHT AND LEFT HEART CATH N/A 03/21/2020  Procedure: RIGHT AND LEFT HEART CATH possible percutaneous intervention;  Surgeon: Wellington [place], MD;  Location: [place] [facility];  Service: Cardiovascular;  Laterality: N/A; . RIGHT/LEFT HEART CATH AND CORONARY/GRAFT ANGIOGRAPHY N/A 02/15/2020  Procedure: RIGHT/LEFT HEART CATH AND CORONARY/GRAFT ANGIOGRAPHY;  Surgeon: [place], MD;  Location: [facility];  Service: Cardiovascular;  Laterality: N/A; . SPLENECTOMY   . VASCULAR SURGERY    LEG STENTS    Social History  Tobacco Use . Smoking status: Former Smoker   Packs/day: 0.25   Years: 40.00   Pack years: 10.00   Types: Cigarettes . Smokeless tobacco: Never Used . Tobacco comment: 1 cigarette a day Vaping  Use . Vaping Use: Never used Substance Use Topics . Alcohol use: No . Drug use: No      Family History Problem Relation Age of Onset . Lung cancer Father 32 . Arthritis Father  . Breast cancer Sister 48 . Alcoholism Sister  . Dementia Mother  . Alzheimer's disease Mother  . Arthritis Mother  . Alcoholism Brother  . Epilepsy Sister  . Diabetes Paternal Grandfather  . Colon polyps Paternal Grandfather 72 . Alcoholism Sister  . Alcoholism Sister  . Alcoholism Brother     Allergies Allergen Reactions . Contrast Media [Iodinated Diagnostic Agents] Rash and Other (See Comments)   Got very hot and red  . Glipizide Other (See Comments)   ANTIDIABETICS. Burning . Hydroxychloroquine Other (See Comments)   Stomach upset . Metrizamide Other (See Comments)   Got very hot and red . Penicillins Hives and Swelling   Has patient had a PCN reaction causing immediate rash, facial/tongue/throat swelling, SOB or lightheadedness with hypotension: yes Has patient had a PCN reaction causing severe rash involving mucus membranes or skin necrosis: no  Has patient had a PCN reaction that required hospitalization: yes Has patient had a PCN reaction occurring within the last 10 years: no If all of the above answers are "NO", then may proceed with Cephalosporin use.     REVIEW OF SYSTEMS (Negative unless checked)  Constitutional: Weight loss  Fever  Chills Cardiac: X Chest pain   Chest pressure   Palpitations   Shortness of breath when laying flat   X Shortness of breath at rest  X  Shortness of breath with exertion. Vascular:  X Pain in legs with walking   Pain in legs at rest   Pain in legs when laying flat   X Claudication   Pain in feet when walking  Pain in feet at rest  Pain in feet when laying flat   History of DVT   Phlebitis   Swelling in legs   Varicose veins   Non-healing ulcers Pulmonary:   Uses home oxygen   Productive cough   Hemoptysis   Wheeze  COPD   Asthma Neurologic:   Dizziness  Blackouts   Seizures   History of stroke   History of TIA  Aphasia   Temporary blindness   Dysphagia  X  Weakness or numbness in arms   Weakness or numbness in legs Musculoskeletal:  Arthritis   Joint swelling   Joint pain   Low back pain Hematologic:  Easy bruising  Easy bleeding   Hypercoagulable state   Anemic   Gastrointestinal:  Blood in stool   Vomiting blood  Gastroesophageal reflux/heartburn   Abdominal pain Genitourinary:  X Chronic kidney disease   Difficult urination  Frequent urination  Burning with urination   Hematuria Skin:  Rashes   Ulcers   Wounds Psychological:  History of anxiety    History of major depression.  Physical Examination  There were no vitals taken for this visit. Gen:  WD/WN, NAD Head: [place]/AT, No temporalis wasting. Ear/Nose/Throat: Hearing grossly intact, nares w/o erythema or drainage Eyes: Conjunctiva clear. Sclera non-icteric Neck: Supple.  Trachea midline Pulmonary:  Good air movement, no use of accessory muscles.  Cardiac: RRR, no JVD, systolic murmur Vascular:  Vessel Right Left Radial Palpable Not Palpable                Musculoskeletal: M/S 5/5 throughout.  No deformity or atrophy. 2+ LE edema. Neurologic: Sensation grossly intact in extremities.  Symmetrical.  Speech is fluent.  Psychiatric: Judgment intact, Mood & affect appropriate for pt's clinical situation. Dermatologic: No rashes or ulcers noted.  No cellulitis or open wounds.      Labs Recent Results (from the past 2160 hour(s)) Troponin I (High Sensitivity)     Status: None  Collection Time: 03/11/20 11:34 AM Result Value Ref Range  Troponin I (High Sensitivity) 15 <18 ng/L   Comment: (NOTE) Elevated high sensitivity troponin I (hsTnI) values and significant  changes across serial measurements may suggest ACS but many other  chronic and acute conditions are known to elevate hsTnI results.  Refer to the "Links" section for chest pain algorithms and additional   guidance. Performed at [facility], [place], [place] [address]  SARS CORONAVIRUS 2 (TAT 6-24 HRS) Nasopharyngeal Nasopharyngeal Swab     Status: None  Collection Time: 03/14/20  3:40 PM  Specimen: Nasopharyngeal Swab Result Value Ref Range  SARS Coronavirus 2 NEGATIVE NEGATIVE   Comment: (NOTE) SARS-CoV-2 target nucleic acids are NOT DETECTED.  The SARS-CoV-2 RNA is generally detectable in upper and lower respiratory specimens during the acute phase of infection. Negative results do not preclude SARS-CoV-2 infection, do not rule out co-infections with other pathogens, and should not be used as the sole basis for treatment or other patient management decisions. Negative results must be combined with clinical observations, patient history, and epidemiological information. The expected result is Negative.  Fact Sheet for Patients: SugarRoll.be  Fact Sheet for Healthcare Providers: https://www.woods-mathews.com/  This test is not yet approved or cleared by the [place] FDA and  has been authorized for detection and/or diagnosis of SARS-CoV-2 by FDA under an Emergency Use Authorization (EUA). This EUA will remain  in effect (meaning this test can be used) for the duration of the COVID-19 declaration under Se ction 564(b)(1) of the Act, 21 U.S.C. section 360bbb-3(b)(1), unless the authorization is terminated or revoked sooner.  Performed at [facility], [place] [address], [place], [place] [address]  CBC     Status: Abnormal  Collection Time: 03/15/20  8:34 AM Result Value Ref Range  WBC 6.2 4.0 - 10.5 K/uL  RBC 2.77 (L) 4.22 - 5.81 MIL/uL  Hemoglobin 8.8 (L) 13.0 - 17.0 g/dL  HCT 28.0 (L) 39 - 52 %  MCV 101.1 (H) 80.0 - 100.0 fL  MCH 31.8 26.0 - 34.0 pg  MCHC 31.4 30.0 - 36.0 g/dL  RDW 14.0 11.5 - 15.5 %  Platelets 131 (  L) 150 - 400 K/uL  nRBC 0.0 0.0 - 0.2 %   Comment: Performed at [facility], [address] [place], [place] [address] Basic metabolic panel     Status: Abnormal  Collection Time: 03/15/20  8:34 AM Result Value Ref Range  Sodium 138 135 - 145 mmol/L  Potassium 4.6 3.5 - 5.1 mmol/L  Chloride 100 98 - 111 mmol/L  CO2 26 22 - 32 mmol/L  Glucose, Bld 165 (H) 70 - 99 mg/dL   Comment: Glucose reference range applies only to samples taken after fasting for at least 8 hours.  BUN 52 (H) 8 - 23 mg/dL  Creatinine, Ser 4.32 (H) 0.61 - 1.24 mg/dL  Calcium 8.7 (L) 8.9 - 10.3 mg/dL  GFR calc non Af Amer 13 (L) >60 mL/min  GFR calc Af Amer 15 (L) >60 mL/min  Anion gap 12 5 - 15   Comment: Performed at [facility], [address] [place], [place] [address] CBC with Differential/Platelet     Status: Abnormal  Collection Time: 03/20/20  6:20 AM Result Value Ref Range  WBC 6.6 4.0 - 10.5 K/uL  RBC 2.49 (L) 4.22 - 5.81 MIL/uL  Hemoglobin 8.0 (L) 13.0 - 17.0 g/dL  HCT 24.3 (L) 39 - 52 %  MCV 97.6 80.0 - 100.0 fL  MCH 32.1 26.0 - 34.0 pg  MCHC 32.9 30.0 - 36.0 g/dL  RDW 14.3 11.5 - 15.5 %  Platelets 125 (L) 150 - 400 K/uL  nRBC 0.0 0.0 - 0.2 %  Neutrophils Relative % 65 %  Neutro Abs 4.3 1.7 - 7.7 K/uL  Lymphocytes Relative 22 %  Lymphs Abs 1.5 0.7 - 4.0 K/uL  Monocytes Relative 9 %  Monocytes Absolute 0.6 0.1 - 1.0 K/uL  Eosinophils Relative 3 %  Eosinophils Absolute 0.2 0.0 - 0.5 K/uL  Basophils Relative 1 %  Basophils Absolute 0.0 0.0 - 0.1 K/uL  Immature Granulocytes 0 %  Abs Immature Granulocytes 0.02 0.00 - 0.07 K/uL   Comment: Performed at [facility], [address] [place], [place] [address] Basic metabolic panel     Status: Abnormal  Collection Time: 03/20/20  6:20 AM Result Value Ref Range  Sodium 138 135 - 145 mmol/L  Potassium 3.8 3.5 - 5.1 mmol/L  Chloride 105 98 - 111 mmol/L  CO2 20 (L) 22 - 32 mmol/L  Glucose, Bld 178 (H) 70 - 99 mg/dL   Comment: Glucose reference range applies only to samples taken after  fasting for at least 8 hours.  BUN 63 (H) 8 - 23 mg/dL  Creatinine, Ser 4.21 (H) 0.61 - 1.24 mg/dL  Calcium 7.8 (L) 8.9 - 10.3 mg/dL  GFR calc non Af Amer 14 (L) >60 mL/min  GFR calc Af Amer 16 (L) >60 mL/min  Anion gap 13 5 - 15   Comment: Performed at [facility], [place], [place] [address] Troponin I (High Sensitivity)     Status: Abnormal  Collection Time: 03/20/20  6:20 AM Result Value Ref Range  Troponin I (High Sensitivity) 19 (H) <18 ng/L   Comment: (NOTE) Elevated high sensitivity troponin I (hsTnI) values and significant  changes across serial measurements may suggest ACS but many other  chronic and acute conditions are known to elevate hsTnI results.  Refer to the "Links" section for chest pain algorithms and additional  guidance. Performed at [facility], [address] [place], [place] [address]  Protime-INR     Status: None  Collection Time: 03/20/20  6:20 AM Result Value Ref Range  Prothrombin Time 14.0 11.4 - 15.2 seconds  INR 1.1 0.8 - 1.2   Comment: (NOTE) INR goal varies based on device and disease states. Performed at [facility], [address] [place], [place] [address]  APTT     Status: None  Collection Time: 03/20/20  6:20 AM Result Value Ref Range  aPTT 34 24 - 36 seconds   Comment: Performed at [facility], [address] [place], [place] [address] Brain natriuretic peptide     Status: Abnormal  Collection Time: 03/20/20  6:20 AM Result Value Ref Range  B Natriuretic Peptide 503.2 (H) 0.0 - 100.0 pg/mL   Comment: Performed at [facility], [address] [place], [place] [address] Magnesium     Status: None  Collection Time: 03/20/20  6:20 AM Result Value Ref Range  Magnesium 2.2 1.7 - 2.4 mg/dL   Comment: Performed at [facility], [address] [place], [place] [address] SARS Coronavirus 2 by RT PCR (hospital order, performed in [facility] hospital lab)  Nasopharyngeal Nasopharyngeal Swab     Status: None  Collection Time: 03/20/20  7:28 AM  Specimen: Nasopharyngeal Swab Result Value Ref Range  SARS Coronavirus 2 NEGATIVE NEGATIVE   Comment: (NOTE) SARS-CoV-2 target nucleic acids are NOT DETECTED.  The SARS-CoV-2 RNA is generally detectable in upper and lower respiratory specimens during the acute phase of infection. The lowest concentration of SARS-CoV-2 viral copies this assay can detect is 250 copies / mL. A negative result does not preclude SARS-CoV-2 infection and should not be used as the sole basis for treatment or other patient management decisions.  A negative result may occur with improper specimen collection / handling, submission of specimen other than nasopharyngeal swab, presence of viral mutation(s) within the areas targeted by this assay, and inadequate number of viral copies (<250 copies / mL). A negative result must be combined with clinical observations, patient history, and epidemiological information.  Fact Sheet for Patients:   StrictlyIdeas.no  Fact Sheet for Healthcare Providers: BankingDealers.co.za  This test is not yet approved or  cleared by the [place] FDA and has been authorized for detection and/or diagnosis of SARS-CoV-2 by FDA under an Emergency Use Authorization (EUA).  This EUA will remain in effect (meaning this test can be used) for the duration of the COVID-19 declaration under Section 564(b)(1) of the Act, 21 U.S.C. section 360bbb-3(b)(1), unless the authorization is terminated or revoked sooner.  Performed at [facility], [place], [place] [address]  Troponin I (High Sensitivity)     Status: Abnormal  Collection Time: 03/20/20  8:27 AM Result Value Ref Range  Troponin I (High Sensitivity) 51 (H) <18 ng/L   Comment: READ BACK AND VERIFIED WITH LORRIE LEMONS AT 4401 03/20/20 DAS (NOTE) Elevated high sensitivity  troponin I (hsTnI) values and significant  changes across serial measurements may suggest ACS but many other  chronic and acute conditions are known to elevate hsTnI results.  Refer to the "Links" section for chest pain algorithms and additional  guidance. Performed at [facility], [address] [place], [place] [address]  Creatinine, serum     Status: Abnormal  Collection Time: 03/20/20  9:19 AM Result Value Ref Range  Creatinine, Ser 4.58 (H) 0.61 - 1.24 mg/dL  GFR calc non Af Amer 12 (L) >60 mL/min  GFR calc Af Amer 14 (L) >60 mL/min   Comment: Performed at  [facility], [address] [place], [place] [address] Glucose, capillary     Status: Abnormal  Collection Time: 03/20/20 12:48 PM Result Value Ref Range  Glucose-Capillary 147 (H) 70 - 99 mg/dL   Comment: Glucose reference range applies only to samples taken after fasting for at least 8 hours. Troponin I (High Sensitivity)     Status: Abnormal  Collection Time: 03/20/20 12:55 PM Result Value Ref Range  Troponin I (High Sensitivity) 86 (H) <18 ng/L   Comment: READ BACK AND VERIFIED WITH SOUMOOUN TOURE@1343  ON 03/20/20 BY [facility] (NOTE) Elevated high sensitivity troponin I (hsTnI) values and significant  changes across serial measurements may suggest ACS but many other  chronic and acute conditions are known to elevate hsTnI results.  Refer to the "Links" section for chest pain algorithms and additional  guidance. Performed at [facility], [address] [place], [place] [address]  Vitamin B12     Status: None  Collection Time: 03/20/20  1:58 PM Result Value Ref Range  Vitamin B-12 281 180 - 914 pg/mL   Comment: (NOTE) This assay is not validated for testing neonatal or myeloproliferative syndrome specimens for Vitamin B12 levels. Performed at [facility], [place] [address] [place], [place] [address]  Folate     Status: None  Collection Time: 03/20/20  1:58 PM Result Value Ref  Range  Folate 10.1 >5.9 ng/mL   Comment: Performed at [facility], [address] [place], [place] [address] Iron and TIBC     Status: None  Collection Time: 03/20/20  1:58 PM Result Value Ref Range  Iron 138 45 - 182 ug/dL  TIBC 382 250 - 450 ug/dL  Saturation Ratios 36 17.9 - 39.5 %  UIBC 244 ug/dL   Comment: Performed at [facility], [address] [place], [place] [address] Ferritin     Status: None  Collection Time: 03/20/20  1:58 PM Result Value Ref Range  Ferritin 32 24 - 336 ng/mL   Comment: Performed at [facility], [address] [place], [place] [address] Reticulocytes     Status: Abnormal  Collection Time: 03/20/20  1:58 PM Result Value Ref Range  Retic Ct Pct 3.6 (H) 0.4 - 3.1 %  RBC. 2.48 (L) 4.22 - 5.81 MIL/uL  Retic Count, Absolute 89.3 19.0 - 186.0 K/uL  Immature Retic Fract 26.3 (H) 2.3 - 15.9 %   Comment: Performed at [facility], [address] [place], [place] [address] Glucose, capillary     Status: Abnormal  Collection Time: 03/20/20  4:41 PM Result Value Ref Range  Glucose-Capillary 169 (H) 70 - 99 mg/dL   Comment: Glucose reference range applies only to samples taken after fasting for at least 8 hours. Heparin level (unfractionated)     Status: Abnormal  Collection Time: 03/20/20  5:52 PM Result Value Ref Range  Heparin Unfractionated 0.17 (L) 0.30 - 0.70 IU/mL   Comment: (NOTE) If heparin results are below expected values, and patient dosage has  been confirmed, suggest follow up testing of antithrombin III levels. Performed at [facility], [address] [place], [place] [address]  Glucose, capillary     Status: Abnormal  Collection Time: 03/20/20  8:44 PM Result Value Ref Range  Glucose-Capillary 228 (H) 70 - 99 mg/dL   Comment: Glucose reference range applies only to samples taken after fasting for at least 8 hours. CBC     Status: Abnormal  Collection Time: 03/21/20  3:41  AM  Result Value Ref Range  WBC 5.9 4.0 - 10.5 K/uL  RBC 2.42 (L) 4.22 - 5.81 MIL/uL  Hemoglobin 7.8 (L) 13.0 - 17.0 g/dL  HCT 23.9 (L) 39 - 52 %  MCV 98.8 80.0 - 100.0 fL  MCH 32.2 26.0 - 34.0 pg  MCHC 32.6 30.0 - 36.0 g/dL  RDW 14.1 11.5 - 15.5 %  Platelets 125 (L) 150 - 400 K/uL  nRBC 0.0 0.0 - 0.2 %   Comment: Performed at [facility], [place], [place] [address] Heparin level (unfractionated)     Status: None  Collection Time: 03/21/20  3:41 AM Result Value Ref Range  Heparin Unfractionated 0.55 0.30 - 0.70 IU/mL   Comment: (NOTE) If heparin results are below expected values, and patient dosage has  been confirmed, suggest follow up testing of antithrombin III levels. Performed at [facility], [address] [place], [place] [address]  Glucose, capillary     Status: Abnormal  Collection Time: 03/21/20  7:28 AM Result Value Ref Range  Glucose-Capillary 187 (H) 70 - 99 mg/dL   Comment: Glucose reference range applies only to samples taken after fasting for at least 8 hours. Glucose, capillary     Status: Abnormal  Collection Time: 03/21/20 10:11 AM Result Value Ref Range  Glucose-Capillary 194 (H) 70 - 99 mg/dL   Comment: Glucose reference range applies only to samples taken after fasting for at least 8 hours. Glucose, capillary     Status: Abnormal  Collection Time: 03/21/20 12:27 PM Result Value Ref Range  Glucose-Capillary 200 (H) 70 - 99 mg/dL   Comment: Glucose reference range applies only to samples taken after fasting for at least 8 hours. Glucose, capillary     Status: Abnormal  Collection Time: 03/21/20  5:29 PM Result Value Ref Range  Glucose-Capillary 274 (H) 70 - 99 mg/dL   Comment: Glucose reference range applies only to samples taken after fasting for at least 8 hours. Glucose, capillary     Status: Abnormal  Collection Time: 03/21/20  9:47 PM Result Value Ref Range  Glucose-Capillary 247 (H) 70 - 99  mg/dL   Comment: Glucose reference range applies only to samples taken after fasting for at least 8 hours. Glucose, capillary     Status: Abnormal  Collection Time: 03/22/20  1:56 AM Result Value Ref Range  Glucose-Capillary 186 (H) 70 - 99 mg/dL   Comment: Glucose reference range applies only to samples taken after fasting for at least 8 hours. Heparin level (unfractionated)     Status: None  Collection Time: 03/22/20  6:54 AM Result Value Ref Range  Heparin Unfractionated 0.59 0.30 - 0.70 IU/mL   Comment: (NOTE) If heparin results are below expected values, and patient dosage has  been confirmed, suggest follow up testing of antithrombin III levels. Performed at [facility], [place] [address] [place], [place] [address]  Basic metabolic panel     Status: Abnormal  Collection Time: 03/22/20  6:54 AM Result Value Ref Range  Sodium 134 (L) 135 - 145 mmol/L  Potassium 3.9 3.5 - 5.1 mmol/L  Chloride 99 98 - 111 mmol/L  CO2 23 22 - 32 mmol/L  Glucose, Bld 210 (H) 70 - 99 mg/dL   Comment: Glucose reference range applies only to samples taken after fasting for at least 8 hours.  BUN 82 (H) 8 - 23 mg/dL  Creatinine, Ser 4.47 (H) 0.61 - 1.24 mg/dL  Calcium 8.5 (L) 8.9 - 10.3 mg/dL  GFR  calc non Af Amer 13 (L) >60 mL/min  GFR calc Af Amer 15 (L) >60 mL/min  Anion gap 12 5 - 15   Comment: Performed at [facility] [address], [place], [place] [address] CBC with Differential/Platelet     Status: Abnormal  Collection Time: 03/22/20  6:54 AM Result Value Ref Range  WBC 8.7 4.0 - 10.5 K/uL  RBC 2.22 (L) 4.22 - 5.81 MIL/uL  Hemoglobin 7.1 (L) 13.0 - 17.0 g/dL  HCT 22.5 (L) 39 - 52 %  MCV 101.4 (H) 80.0 - 100.0 fL  MCH 32.0 26.0 - 34.0 pg  MCHC 31.6 30.0 - 36.0 g/dL  RDW 14.3 11.5 - 15.5 %  Platelets 135 (L) 150 - 400 K/uL  nRBC 0.0 0.0 - 0.2 %  Neutrophils Relative % 70 %  Neutro Abs 6.2 1.7 - 7.7 K/uL  Lymphocytes Relative 19 %  Lymphs Abs 1.6 0.7 - 4.0  K/uL  Monocytes Relative 8 %  Monocytes Absolute 0.7 0.1 - 1.0 K/uL  Eosinophils Relative 2 %  Eosinophils Absolute 0.1 0.0 - 0.5 K/uL  Basophils Relative 0 %  Basophils Absolute 0.0 0.0 - 0.1 K/uL  Immature Granulocytes 1 %  Abs Immature Granulocytes 0.05 0.00 - 0.07 K/uL   Comment: Performed at [facility]  [address] [place], [place] [address] Glucose, capillary     Status: Abnormal  Collection Time: 03/22/20  6:55 AM Result Value Ref Range  Glucose-Capillary 217 (H) 70 - 99 mg/dL   Comment: Glucose reference range applies only to samples taken after fasting for at least 8 hours. Type and screen [place]     Status: None  Collection Time: 03/22/20  7:00 AM Result Value Ref Range  ABO/RH(D) AB POS   Antibody Screen NEG   Sample Expiration 03/25/2020,2359   Unit Number [id number]   Blood Component Type RED CELLS,LR   Unit division 00   Status of Unit ISSUED,FINAL   Transfusion Status OK TO TRANSFUSE   Crossmatch Result Compatible   Unit Number [id number]   Blood Component Type RED CELLS,LR   Unit division 00   Status of Unit ISSUED,FINAL   Transfusion Status OK TO TRANSFUSE   Crossmatch Result     Compatible Performed at [facility], [place] [address] [place], [place] [address]  BPAM RBC     Status: None  Collection Time: 03/22/20  7:00 AM Result Value Ref Range  ISSUE DATE / TIME 549826415830   Blood Product Unit Number [id number]   PRODUCT CODE [id number]   Unit Type and Rh 8400   Blood Product Expiration Date 292446286381   ISSUE DATE / TIME [id number]   Blood Product Unit Number [id number]   PRODUCT CODE [id number]   Unit Type and Rh 8400   Blood Product Expiration Date 414239532023  Prepare RBC (crossmatch)     Status: None  Collection Time: 03/22/20  9:48 AM Result Value Ref Range  Order Confirmation     ORDER PROCESSED BY BLOOD BANK Performed at [facility], [place] [address] [place], [place]  [address]  Glucose, capillary     Status: Abnormal  Collection Time: 03/22/20 11:20 AM Result Value Ref Range  Glucose-Capillary 155 (H) 70 - 99 mg/dL   Comment: Glucose reference range applies only to samples taken after fasting for at least 8 hours. ECHOCARDIOGRAM COMPLETE     Status: None  Collection Time: 03/22/20 12:34 PM Result Value Ref Range  Weight 3,742.4  oz  Height 66 in  BP 145/84 mmHg  Single Plane A2C EF 41.7 %  Single Plane A4C EF 17.9 %  Calc EF 28.4 %  S' Lateral 6.20 cm  AR max vel 1.15 cm2  AV Area VTI 1.08 cm2  AV Mean grad 22.0 mmHg  AV Peak grad 34.9 mmHg  Ao pk vel 2.95 m/s  Area-P 1/2 2.82 cm2  Radius 0.30 cm  MV M vel 5.36 m/s  AV Area mean vel 1.19 cm2  MV Peak grad 114.9 mmHg Glucose, capillary     Status: Abnormal  Collection Time: 03/22/20  5:04 PM Result Value Ref Range  Glucose-Capillary 163 (H) 70 - 99 mg/dL   Comment: Glucose reference range applies only to samples taken after fasting for at least 8 hours. Hemoglobin and hematocrit, blood     Status: Abnormal  Collection Time: 03/22/20  6:15 PM Result Value Ref Range  Hemoglobin 8.5 (L) 13.0 - 17.0 g/dL  HCT 27.0 (L) 39 - 52 %   Comment: Performed at [facility] [address] [place], [place] [address] Glucose, capillary     Status: Abnormal  Collection Time: 03/22/20  9:44 PM Result Value Ref Range  Glucose-Capillary 152 (H) 70 - 99 mg/dL   Comment: Glucose reference range applies only to samples taken after fasting for at least 8 hours. Basic metabolic panel     Status: Abnormal  Collection Time: 03/23/20  5:24 AM Result Value Ref Range  Sodium 133 (L) 135 - 145 mmol/L  Potassium 4.8 3.5 - 5.1 mmol/L  Chloride 97 (L) 98 - 111 mmol/L  CO2 23 22 - 32 mmol/L  Glucose, Bld 218 (H) 70 - 99 mg/dL   Comment: Glucose reference range applies only to samples taken after fasting for at least 8 hours.  BUN 85 (H) 8 - 23 mg/dL  Creatinine, Ser 4.68 (H) 0.61 - 1.24 mg/dL  Calcium 8.6  (L) 8.9 - 10.3 mg/dL  GFR calc non Af Amer 12 (L) >60 mL/min  GFR calc Af Amer 14 (L) >60 mL/min  Anion gap 13 5 - 15   Comment: Performed at [facility] [address], [place], [place] [address] Heparin level (unfractionated)     Status: None  Collection Time: 03/23/20  5:24 AM Result Value Ref Range  Heparin Unfractionated 0.43 0.30 - 0.70 IU/mL   Comment: (NOTE) If heparin results are below expected values, and patient dosage has  been confirmed, suggest follow up testing of antithrombin III levels. Performed at [facility], [place] [address], [place], [place] [address]  CBC     Status: Abnormal  Collection Time: 03/23/20  5:24 AM Result Value Ref Range  WBC 8.3 4.0 - 10.5 K/uL  RBC 2.57 (L) 4.22 - 5.81 MIL/uL  Hemoglobin 7.9 (L) 13.0 - 17.0 g/dL  HCT 25.1 (L) 39 - 52 %  MCV 97.7 80.0 - 100.0 fL  MCH 30.7 26.0 - 34.0 pg  MCHC 31.5 30.0 - 36.0 g/dL  RDW 16.2 (H) 11.5 - 15.5 %  Platelets 128 (L) 150 - 400 K/uL  nRBC 0.0 0.0 - 0.2 %   Comment: Performed at [facility], [place] [address], [place], [place] [address] Glucose, capillary     Status: Abnormal  Collection Time: 03/23/20  6:05 AM Result Value Ref Range  Glucose-Capillary 211 (H) 70 - 99 mg/dL   Comment: Glucose reference range applies only to samples taken after fasting for at least 8  hours. Hemoglobin and hematocrit, blood     Status: Abnormal  Collection Time: 03/23/20  7:28 AM Result Value Ref Range  Hemoglobin 7.9 (L) 13.0 - 17.0 g/dL  HCT 25.1 (L) 39 - 52 %   Comment: Performed at [facility] [address], [place], [place] [address] Prepare RBC (crossmatch)     Status: None  Collection Time: 03/23/20  9:24 AM Result Value Ref Range  Order Confirmation     ORDER PROCESSED BY BLOOD BANK Performed at [facility], [place] [address] [place], [place] [address]  Glucose, capillary     Status: Abnormal  Collection Time: 03/23/20 11:22 AM Result Value Ref  Range  Glucose-Capillary 210 (H) 70 - 99 mg/dL   Comment: Glucose reference range applies only to samples taken after fasting for at least 8 hours. POCT Activated clotting time     Status: None  Collection Time: 03/23/20  2:12 PM Result Value Ref Range  Activated Clotting Time 290 seconds Glucose, capillary     Status: Abnormal  Collection Time: 03/23/20  8:34 PM Result Value Ref Range  Glucose-Capillary 301 (H) 70 - 99 mg/dL   Comment: Glucose reference range applies only to samples taken after fasting for at least 8 hours. Glucose, capillary     Status: Abnormal  Collection Time: 03/24/20 12:20 AM Result Value Ref Range  Glucose-Capillary 202 (H) 70 - 99 mg/dL   Comment: Glucose reference range applies only to samples taken after fasting for at least 8 hours. Ferritin     Status: None  Collection Time: 03/24/20  3:57 AM Result Value Ref Range  Ferritin 45 24 - 336 ng/mL   Comment: Performed at [facility], [place] [address], [place], [place] [address] CBC     Status: Abnormal  Collection Time: 03/24/20  3:57 AM Result Value Ref Range  WBC 10.9 (H) 4.0 - 10.5 K/uL  RBC 3.16 (L) 4.22 - 5.81 MIL/uL  Hemoglobin 9.5 (L) 13.0 - 17.0 g/dL  HCT 30.2 (L) 39 - 52 %  MCV 95.6 80.0 - 100.0 fL  MCH 30.1 26.0 - 34.0 pg  MCHC 31.5 30.0 - 36.0 g/dL  RDW 17.1 (H) 11.5 - 15.5 %  Platelets 159 150 - 400 K/uL  nRBC 0.0 0.0 - 0.2 %   Comment: Performed at [facility], [place] [address] [place], [place] [address] Comprehensive metabolic panel     Status: Abnormal  Collection Time: 03/24/20  3:57 AM Result Value Ref Range  Sodium 134 (L) 135 - 145 mmol/L  Potassium 4.5 3.5 - 5.1 mmol/L  Chloride 96 (L) 98 - 111 mmol/L  CO2 25 22 - 32 mmol/L  Glucose, Bld 196 (H) 70 - 99 mg/dL   Comment: Glucose reference range applies only to samples taken after fasting for at least 8 hours.  BUN 92 (H) 8 - 23 mg/dL  Creatinine, Ser 4.77 (H) 0.61 - 1.24 mg/dL  Calcium 9.1 8.9 - 10.3 mg/dL  Total  Protein 7.7 6.5 - 8.1 g/dL  Albumin 3.8 3.5 - 5.0 g/dL  AST 20 15 - 41 U/L  ALT 18 0 - 44 U/L  Alkaline Phosphatase 72 38 - 126 U/L  Total Bilirubin 0.5 0.3 - 1.2 mg/dL  GFR calc non Af Amer 12 (L) >60 mL/min  GFR calc Af Amer 14 (L) >60 mL/min  Anion gap 13 5 - 15   Comment: Performed at [facility] [address] [place], [place] [address]  Magnesium     Status: Abnormal  Collection Time: 03/24/20  3:57 AM Result Value Ref Range  Magnesium 2.7 (H) 1.7 - 2.4 mg/dL   Comment: Performed at [facility] [address] [place],  [place] [address] Phosphorus     Status: Abnormal  Collection Time: 03/24/20  3:57 AM Result Value Ref Range  Phosphorus 5.0 (H) 2.5 - 4.6 mg/dL   Comment: Performed at [facility] [address] [place], [place] [address] Glucose, capillary     Status: Abnormal  Collection Time: 03/24/20  4:22 AM Result Value Ref Range  Glucose-Capillary 183 (H) 70 - 99 mg/dL   Comment: Glucose reference range applies only to samples taken after fasting for at least 8 hours. Glucose, capillary     Status: Abnormal  Collection Time: 03/24/20  8:05 AM Result Value Ref Range  Glucose-Capillary 173 (H) 70 - 99 mg/dL   Comment: Glucose reference range applies only to samples taken after fasting for at least 8 hours. Glucose, capillary     Status: Abnormal  Collection Time: 03/24/20 12:15 PM Result Value Ref Range  Glucose-Capillary 188 (H) 70 - 99 mg/dL   Comment: Glucose reference range applies only to samples taken after fasting for at least 8 hours.  Comment 1 JTK   Comment 2 Repeat Test  Glucose, capillary     Status: Abnormal  Collection Time: 03/24/20  4:39 PM Result Value Ref Range  Glucose-Capillary 182 (H) 70 - 99 mg/dL   Comment: Glucose reference range applies only to samples taken after fasting for at least 8 hours. Glucose, capillary     Status: Abnormal  Collection Time: 03/24/20 10:33 PM Result Value Ref Range  Glucose-Capillary 207  (H) 70 - 99 mg/dL   Comment: Glucose reference range applies only to samples taken after fasting for at least 8 hours. Glucose, capillary     Status: Abnormal  Collection Time: 03/25/20  8:17 AM Result Value Ref Range  Glucose-Capillary 132 (H) 70 - 99 mg/dL   Comment: Glucose reference range applies only to samples taken after fasting for at least 8 hours. Glucose, capillary     Status: Abnormal  Collection Time: 03/25/20 11:20 AM Result Value Ref Range  Glucose-Capillary 264 (H) 70 - 99 mg/dL   Comment: Glucose reference range applies only to samples taken after fasting for at least 8 hours. Basic metabolic panel     Status: Abnormal  Collection Time: 03/25/20  1:08 PM Result Value Ref Range  Sodium 133 (L) 135 - 145 mmol/L  Potassium 4.1 3.5 - 5.1 mmol/L  Chloride 97 (L) 98 - 111 mmol/L  CO2 25 22 - 32 mmol/L  Glucose, Bld 211 (H) 70 - 99 mg/dL   Comment: Glucose reference range applies only to samples taken after fasting for at least 8 hours.  BUN 93 (H) 8 - 23 mg/dL  Creatinine, Ser 5.19 (H) 0.61 - 1.24 mg/dL  Calcium 7.9 (L) 8.9 - 10.3 mg/dL  GFR calc non Af Amer 11 (L) >60 mL/min  GFR calc Af Amer 12 (L) >60 mL/min  Anion gap 11 5 - 15   Comment: Performed at [facility] [address], [place], [place] [address] CBC     Status: Abnormal  Collection Time: 03/25/20  1:08 PM Result Value Ref Range  WBC 9.2 4.0 - 10.5 K/uL  RBC 2.86 (L) 4.22 - 5.81 MIL/uL  Hemoglobin 8.8 (L) 13.0 - 17.0 g/dL  HCT 28.0 (L) 39 - 52 %  MCV 97.9 80.0 - 100.0 fL  MCH 30.8 26.0 - 34.0 pg  MCHC 31.4 30.0 - 36.0 g/dL  RDW 16.7 (H) 11.5 - 15.5 %  Platelets 141 (L) 150 - 400 K/uL  nRBC 0.0 0.0 - 0.2 %   Comment: Performed at [facility] [address], [place], [place] [address] Magnesium     Status: Abnormal  Collection Time: 03/25/20  1:08 PM Result Value Ref Range  Magnesium 2.8 (H) 1.7 - 2.4 mg/dL   Comment: Performed at [facility] [address], [place],  [place] [address] CBC with Differential/Platelet     Status: Abnormal  Collection Time: 04/01/20  8:13 AM Result Value Ref Range  WBC 8.1 4.0 - 10.5 K/uL  RBC 3.00 (L) 4.22 - 5.81 MIL/uL  Hemoglobin 9.4 (L) 13.0 - 17.0 g/dL  HCT 29.5 (L) 39 - 52 %  MCV 98.3 80.0 - 100.0 fL  MCH 31.3 26.0 - 34.0 pg  MCHC 31.9 30.0 - 36.0 g/dL  RDW 15.9 (H) 11.5 - 15.5 %  Platelets 151 150 - 400 K/uL  nRBC 0.0 0.0 - 0.2 %  Neutrophils Relative % 69 %  Neutro Abs 5.6 1.7 - 7.7 K/uL  Lymphocytes Relative 18 %  Lymphs Abs 1.5 0.7 - 4.0 K/uL  Monocytes Relative 9 %  Monocytes Absolute 0.8 0.1 - 1.0 K/uL  Eosinophils Relative 3 %  Eosinophils Absolute 0.2 0.0 - 0.5 K/uL  Basophils Relative 1 %  Basophils Absolute 0.0 0.0 - 0.1 K/uL  Immature Granulocytes 0 %  Abs Immature Granulocytes 0.03 0.00 - 0.07 K/uL   Comment: Performed at [facility], [address] [place], [place] [address] Basic metabolic panel     Status: Abnormal  Collection Time: 04/01/20  8:13 AM Result Value Ref Range  Sodium 140 135 - 145 mmol/L  Potassium 4.3 3.5 - 5.1 mmol/L  Chloride 102 98 - 111 mmol/L  CO2 27 22 - 32 mmol/L  Glucose, Bld 160 (H) 70 - 99 mg/dL   Comment: Glucose reference range applies only to samples taken after fasting for at least 8 hours.  BUN 59 (H) 8 - 23 mg/dL  Creatinine, Ser 3.77 (H) 0.61 - 1.24 mg/dL  Calcium 8.3 (L) 8.9 - 10.3 mg/dL  GFR calc non Af Amer 16 (L) >60 mL/min  GFR calc Af Amer 18 (L) >60 mL/min  Anion gap 11 5 - 15   Comment: Performed at [facility], [address] [place], [place] [address] Basic metabolic panel     Status: Abnormal  Collection Time: 04/09/20  9:16 AM Result Value Ref Range  Sodium 135 135 - 145 mmol/L  Potassium 4.7 3.5 - 5.1 mmol/L  Chloride 97 (L) 98 - 111 mmol/L  CO2 22 22 - 32 mmol/L  Glucose, Bld 252 (H) 70 - 99 mg/dL   Comment: Glucose reference range applies only to samples taken after fasting for at least 8 hours.  BUN 94 (H) 8 - 23  mg/dL   Comment: RESULT CONFIRMED BY MANUAL DILUTION.PMF  Creatinine, Ser 3.99 (H) 0.61 - 1.24 mg/dL  Calcium 8.5 (L) 8.9 - 10.3 mg/dL  GFR calc non Af Amer 15 (L) >60 mL/min  GFR calc Af Amer 17 (L) >60 mL/min  Anion gap 16 (H) 5 - 15   Comment: Performed at [facility], [address], [place], [place] [address] CBC     Status: Abnormal  Collection Time: 04/09/20  9:16 AM Result Value  Ref Range  WBC 10.3 4.0 - 10.5 K/uL  RBC 2.02 (L) 4.22 - 5.81 MIL/uL  Hemoglobin 6.5 (L) 13.0 - 17.0 g/dL  HCT 19.7 (L) 39 - 52 %  MCV 97.5 80.0 - 100.0 fL  MCH 32.2 26.0 - 34.0 pg  MCHC 33.0 30.0 - 36.0 g/dL  RDW 16.5 (H) 11.5 - 15.5 %  Platelets 162 150 - 400 K/uL  nRBC 0.0 0.0 - 0.2 %   Comment: Performed at [facility], [address] [place], [place] [address] Troponin I (High Sensitivity)     Status: Abnormal  Collection Time: 04/09/20  9:16 AM Result Value Ref Range  Troponin I (High Sensitivity) 129 (HH) <18 ng/L   Comment: CRITICAL RESULT CALLED TO, READ BACK BY AND VERIFIED WITH JESSICA COLETRAIN 04/09/20 1000 KLW (NOTE) Elevated high sensitivity troponin I (hsTnI) values and significant  changes across serial measurements may suggest ACS but many other  chronic and acute conditions are known to elevate hsTnI results.  Refer to the "Links" section for chest pain algorithms and additional  guidance. Performed at [facility], [place] [address] [place], [place] [address]  Prepare RBC (crossmatch)     Status: None  Collection Time: 04/09/20  9:57 AM Result Value Ref Range  Order Confirmation     ORDER PROCESSED BY BLOOD BANK Performed at [facility], [address] [place], [place] [address]  Type and screen [place]     Status: None  Collection Time: 04/09/20 10:13 AM Result Value Ref Range  ABO/RH(D) AB POS   Antibody Screen NEG   Sample Expiration 04/12/2020,2359   Unit Number [id number]   Blood Component  Type RED CELLS,LR   Unit division 00   Status of Unit ISSUED,FINAL   Transfusion Status OK TO TRANSFUSE   Crossmatch Result Compatible   Unit Number [id number]   Blood Component Type RED CELLS,LR   Unit division 00   Status of Unit ISSUED,FINAL   Transfusion Status OK TO TRANSFUSE   Crossmatch Result Compatible   Unit Number [id number]   Blood Component Type RED CELLS,LR   Unit division 00   Status of Unit ISSUED,FINAL   Transfusion Status OK TO TRANSFUSE   Crossmatch Result Compatible   Unit Number [id number]   Blood Component Type RED CELLS,LR   Unit division 00   Status of Unit REL FROM [facility]   Transfusion Status OK TO TRANSFUSE   Crossmatch Result     Compatible Performed at [facility], [address] [place], [place] [address]   Unit Number [id number]   Blood Component Type RED CELLS,LR   Unit division 00   Status of Unit REL FROM [facility]   Transfusion Status OK TO TRANSFUSE   Crossmatch Result Compatible  BPAM RBC     Status: None  Collection Time: 04/09/20 10:13 AM Result Value Ref Range  ISSUE DATE / TIME 166063016010   Blood Product Unit Number [id number]   PRODUCT CODE [id number]   Unit Type and Rh 6200   Blood Product Expiration Date 628315176160   ISSUE DATE / TIME 737106269485   Blood Product Unit Number [id number]   PRODUCT CODE [id number]   Unit Type and Rh 6200   Blood Product Expiration Date 967893810175   ISSUE DATE / TIME 102585277824   Blood Product Unit Number [id number]   PRODUCT CODE [id number]   Unit Type and Rh 0600   Blood Product Expiration Date 093267124580   Blood Product Unit Number [id number]  PRODUCT CODE [id number]   Unit Type and Rh 6200   Blood Product Expiration Date 202110122359   Blood Product Unit Number [id number]   PRODUCT CODE [id number]   Unit Type and Rh 6200   Blood Product Expiration Date 202110122359  SARS Coronavirus 2 by RT PCR (hospital order, performed in [facility] hospital lab)  Nasopharyngeal Nasopharyngeal Swab     Status: None  Collection Time: 04/09/20 11:12 AM  Specimen: Nasopharyngeal Swab Result Value Ref Range  SARS Coronavirus 2 NEGATIVE NEGATIVE   Comment: (NOTE) SARS-CoV-2 target nucleic acids are NOT DETECTED.  The SARS-CoV-2 RNA is generally detectable in upper and lower respiratory specimens during the acute phase of infection. The lowest concentration of SARS-CoV-2 viral copies this assay can detect is 250 copies / mL. A negative result does not preclude SARS-CoV-2 infection and should not be used as the sole basis for treatment or other patient management decisions.  A negative result may occur with improper specimen collection / handling, submission of specimen other than nasopharyngeal swab, presence of viral mutation(s) within the areas targeted by this assay, and inadequate number of viral copies (<250 copies / mL). A negative result must be combined with clinical observations, patient history, and epidemiological information.  Fact Sheet for Patients:   StrictlyIdeas.no  Fact Sheet for Healthcare Providers: BankingDealers.co.za  This test is not yet approved or  cleared by the [place] FDA and has been authorized for detection and/or diagnosis of SARS-CoV-2 by FDA under an Emergency Use Authorization (EUA).  This EUA will remain in effect (meaning this test can be used) for the duration of the COVID-19 declaration under Section 564(b)(1) of the Act, 21 U.S.C. section 360bbb-3(b)(1), unless the authorization is terminated or revoked sooner.  Performed at [facility], [address] [place], [place] [address]  Troponin I (High Sensitivity)     Status: Abnormal  Collection Time: 04/09/20 11:15 AM Result Value Ref Range  Troponin I (High Sensitivity) 223 (HH) <18 ng/L   Comment: CRITICAL VALUE NOTED. VALUE IS CONSISTENT WITH PREVIOUSLY REPORTED/CALLED VALUE  SNG (NOTE) Elevated high sensitivity troponin I (hsTnI) values and significant  changes across serial measurements may suggest ACS but many other  chronic and acute conditions are known to elevate hsTnI results.  Refer to the "Links" section for chest pain algorithms and additional  guidance. Performed at [facility], [place], [place] [address]  Troponin I (High Sensitivity)     Status: Abnormal  Collection Time: 04/09/20  2:28 PM Result Value Ref Range  Troponin I (High Sensitivity) 402 (HH) <18 ng/L   Comment: CRITICAL VALUE NOTED. VALUE IS CONSISTENT WITH PREVIOUSLY REPORTED/CALLED VALUE.PMF (NOTE) Elevated high sensitivity troponin I (hsTnI) values and significant  changes across serial measurements may suggest ACS but many other  chronic and acute conditions are known to elevate hsTnI results.  Refer to the "Links" section for chest pain algorithms and additional  guidance. Performed at [facility], [address] [place], [place] [address]  Glucose, capillary     Status: Abnormal  Collection Time: 04/09/20  4:06 PM Result Value Ref Range  Glucose-Capillary 162 (H) 70 - 99 mg/dL   Comment: Glucose reference range applies only to samples taken after fasting for at least 8 hours. Hemoglobin and hematocrit, blood     Status: Abnormal  Collection Time: 04/09/20  4:07 PM Result Value Ref Range  Hemoglobin 7.6 (L) 13.0 - 17.0 g/dL  HCT 23.1 (L) 39 - 52 %  Comment: Performed at [facility], [address] [place], [place] [address] Troponin I (High Sensitivity)     Status: Abnormal  Collection Time: 04/09/20  4:07 PM Result Value Ref Range  Troponin I (High Sensitivity) 604 (HH) <18 ng/L   Comment: CRITICAL VALUE NOTED. VALUE IS CONSISTENT WITH PREVIOUSLY REPORTED/CALLED VALUE.PMF (NOTE) Elevated high sensitivity troponin I (hsTnI) values and significant  changes across serial measurements may suggest ACS but many other   chronic and acute conditions are known to elevate hsTnI results.  Refer to the "Links" section for chest pain algorithms and additional  guidance. Performed at [facility], [address] [place], [place] [id number]  Glucose, capillary     Status: Abnormal  Collection Time: 04/09/20  7:35 PM Result Value Ref Range  Glucose-Capillary 147 (H) 70 - 99 mg/dL   Comment: Glucose reference range applies only to samples taken after fasting for at least 8 hours. Glucose, capillary     Status: Abnormal  Collection Time: 04/10/20  4:49 AM Result Value Ref Range  Glucose-Capillary 136 (H) 70 - 99 mg/dL   Comment: Glucose reference range applies only to samples taken after fasting for at least 8 hours. Basic metabolic panel     Status: Abnormal  Collection Time: 04/10/20  5:01 AM Result Value Ref Range  Sodium 134 (L) 135 - 145 mmol/L  Potassium 4.3 3.5 - 5.1 mmol/L  Chloride 96 (L) 98 - 111 mmol/L  CO2 25 22 - 32 mmol/L  Glucose, Bld 189 (H) 70 - 99 mg/dL   Comment: Glucose reference range applies only to samples taken after fasting for at least 8 hours.  BUN 100 (H) 8 - 23 mg/dL  Creatinine, Ser 4.03 (H) 0.61 - 1.24 mg/dL  Calcium 8.7 (L) 8.9 - 10.3 mg/dL  GFR calc non Af Amer 14 (L) >60 mL/min  GFR calc Af Amer 17 (L) >60 mL/min  Anion gap 13 5 - 15   Comment: Performed at [facility], [address] [place], [place] [address] Hemoglobin and hematocrit, blood     Status: Abnormal  Collection Time: 04/10/20  5:01 AM Result Value Ref Range  Hemoglobin 8.0 (L) 13.0 - 17.0 g/dL  HCT 24.3 (L) 39 - 52 %   Comment: Performed at [facility], [address] [place],  [address] Glucose, capillary     Status: Abnormal  Collection Time: 04/10/20  7:32 AM Result Value Ref Range  Glucose-Capillary 160 (H) 70 - 99 mg/dL   Comment: Glucose reference range applies only to samples taken after fasting for at least 8 hours. Glucose, capillary     Status:  Abnormal  Collection Time: 04/10/20 11:36 AM Result Value Ref Range  Glucose-Capillary 149 (H) 70 - 99 mg/dL   Comment: Glucose reference range applies only to samples taken after fasting for at least 8 hours. CBC     Status: Abnormal  Collection Time: 04/10/20  4:03 PM Result Value Ref Range  WBC 8.7 4.0 - 10.5 K/uL  RBC 2.49 (L) 4.22 - 5.81 MIL/uL  Hemoglobin 7.7 (L) 13.0 - 17.0 g/dL  HCT 23.3 (L) 39 - 52 %  MCV 93.6 80.0 - 100.0 fL  MCH 30.9 26.0 - 34.0 pg  MCHC 33.0 30.0 - 36.0 g/dL  RDW 17.2 (H) 11.5 - 15.5 %  Platelets 160 150 - 400 K/uL  nRBC 0.0 0.0 - 0.2 %   Comment: Performed at [facility], [address] [place],  [address] Glucose, capillary  Status: Abnormal  Collection Time: 04/10/20  4:11 PM Result Value Ref Range  Glucose-Capillary 161 (H) 70 - 99 mg/dL   Comment: Glucose reference range applies only to samples taken after fasting for at least 8 hours. Prepare RBC (crossmatch)     Status: None  Collection Time: 04/10/20  4:41 PM Result Value Ref Range  Order Confirmation     ORDER PROCESSED BY BLOOD BANK Performed at [facility], [address] [place], [place] [address]  Glucose, capillary     Status: Abnormal  Collection Time: 04/10/20  8:18 PM Result Value Ref Range  Glucose-Capillary 139 (H) 70 - 99 mg/dL   Comment: Glucose reference range applies only to samples taken after fasting for at least 8 hours. Glucose, capillary     Status: Abnormal  Collection Time: 04/11/20 12:11 AM Result Value Ref Range  Glucose-Capillary 148 (H) 70 - 99 mg/dL   Comment: Glucose reference range applies only to samples taken after fasting for at least 8 hours. Glucose, capillary     Status: Abnormal  Collection Time: 04/11/20  5:01 AM Result Value Ref Range  Glucose-Capillary 110 (H) 70 - 99 mg/dL   Comment: Glucose reference range applies only to samples taken after fasting for at least 8 hours. Glucose, capillary     Status:  Abnormal  Collection Time: 04/11/20  8:16 AM Result Value Ref Range  Glucose-Capillary 121 (H) 70 - 99 mg/dL   Comment: Glucose reference range applies only to samples taken after fasting for at least 8 hours. CBC     Status: Abnormal  Collection Time: 04/11/20  9:03 AM Result Value Ref Range  WBC 7.0 4.0 - 10.5 K/uL  RBC 2.86 (L) 4.22 - 5.81 MIL/uL  Hemoglobin 8.7 (L) 13.0 - 17.0 g/dL  HCT 25.4 (L) 39 - 52 %  MCV 88.8 80.0 - 100.0 fL  MCH 30.4 26.0 - 34.0 pg  MCHC 34.3 30.0 - 36.0 g/dL  RDW 19.2 (H) 11.5 - 15.5 %  Platelets 163 150 - 400 K/uL  nRBC 0.0 0.0 - 0.2 %   Comment: Performed at [facility], [address], [place], [place] [address] Basic metabolic panel     Status: Abnormal  Collection Time: 04/11/20  9:03 AM Result Value Ref Range  Sodium 136 135 - 145 mmol/L  Potassium 3.8 3.5 - 5.1 mmol/L  Chloride 98 98 - 111 mmol/L  CO2 24 22 - 32 mmol/L  Glucose, Bld 138 (H) 70 - 99 mg/dL   Comment: Glucose reference range applies only to samples taken after fasting for at least 8 hours.  BUN 95 (H) 8 - 23 mg/dL  Creatinine, Ser 3.87 (H) 0.61 - 1.24 mg/dL  Calcium 8.3 (L) 8.9 - 10.3 mg/dL  GFR calc non Af Amer 15 (L) >60 mL/min  GFR calc Af Amer 18 (L) >60 mL/min  Anion gap 14 5 - 15   Comment: Performed at [facility], [address] [place], [place] [address] Troponin I (High Sensitivity)     Status: Abnormal  Collection Time: 04/11/20  9:03 AM Result Value Ref Range  Troponin I (High Sensitivity) 1,471 (HH) <18 ng/L   Comment: CRITICAL VALUE NOTED. VALUE IS CONSISTENT WITH PREVIOUSLY REPORTED/CALLED VALUE...[place] (NOTE) Elevated high sensitivity troponin I (hsTnI) values and significant  changes across serial measurements may suggest ACS but many other  chronic and acute conditions are known to elevate hsTnI results.  Refer to the "Links" section for chest pain algorithms and additional  guidance. Performed at [facility], [address] [place], [place] [address]  Glucose, capillary     Status: Abnormal  Collection Time: 04/11/20 11:37 AM Result Value Ref Range  Glucose-Capillary 109 (H) 70 - 99 mg/dL   Comment: Glucose reference range applies only to samples taken after fasting for at least 8 hours. Glucose, capillary     Status: None  Collection Time: 04/11/20 12:49 PM Result Value Ref Range  Glucose-Capillary 97 70 - 99 mg/dL   Comment: Glucose reference range applies only to samples taken after fasting for at least 8 hours. Glucose, capillary     Status: Abnormal  Collection Time: 04/11/20  4:35 PM Result Value Ref Range  Glucose-Capillary 202 (H) 70 - 99 mg/dL   Comment: Glucose reference range applies only to samples taken after fasting for at least 8 hours. Glucose, capillary     Status: Abnormal  Collection Time: 04/11/20  8:47 PM Result Value Ref Range  Glucose-Capillary 115 (H) 70 - 99 mg/dL   Comment: Glucose reference range applies only to samples taken after fasting for at least 8 hours. Glucose, capillary     Status: Abnormal  Collection Time: 04/12/20 12:08 AM Result Value Ref Range  Glucose-Capillary 145 (H) 70 - 99 mg/dL   Comment: Glucose reference range applies only to samples taken after fasting for at least 8 hours. Glucose, capillary     Status: Abnormal  Collection Time: 04/12/20  4:05 AM Result Value Ref Range  Glucose-Capillary 102 (H) 70 - 99 mg/dL   Comment: Glucose reference range applies only to samples taken after fasting for at least 8 hours. CBC     Status: Abnormal  Collection Time: 04/12/20  5:20 AM Result Value Ref Range  WBC 6.8 4.0 - 10.5 K/uL  RBC 2.87 (L) 4.22 - 5.81 MIL/uL  Hemoglobin 8.7 (L) 13.0 - 17.0 g/dL  HCT 25.9 (L) 39 - 52 %  MCV 90.2 80.0 - 100.0 fL  MCH 30.3 26.0 - 34.0 pg  MCHC 33.6 30.0 - 36.0 g/dL  RDW 19.0 (H) 11.5 - 15.5 %  Platelets 171 150 - 400 K/uL  nRBC 0.0 0.0 - 0.2 %   Comment: Performed at [facility], [address],  [place], [place] [address] Basic metabolic panel     Status: Abnormal  Collection Time: 04/12/20  5:20 AM Result Value Ref Range  Sodium 136 135 - 145 mmol/L  Potassium 3.7 3.5 - 5.1 mmol/L  Chloride 98 98 - 111 mmol/L  CO2 25 22 - 32 mmol/L  Glucose, Bld 120 (H) 70 - 99 mg/dL   Comment: Glucose reference range applies only to samples taken after fasting for at least 8 hours.  BUN 90 (H) 8 - 23 mg/dL  Creatinine, Ser 3.89 (H) 0.61 - 1.24 mg/dL  Calcium 8.2 (L) 8.9 - 10.3 mg/dL  GFR calc non Af Amer 15 (L) >60 mL/min  GFR calc Af Amer 18 (L) >60 mL/min  Anion gap 13 5 - 15   Comment: Performed at [facility], [address] [place], [place] [address] Glucose, capillary     Status: Abnormal  Collection Time: 04/12/20  8:28 AM Result Value Ref Range  Glucose-Capillary 126 (H) 70 - 99 mg/dL   Comment: Glucose reference range applies only to samples taken after fasting for at least 8 hours. Glucose, capillary     Status: Abnormal  Collection Time: 04/12/20 12:04 PM Result Value Ref Range  Glucose-Capillary 109 (H) 70 - 99  mg/dL   Comment: Glucose reference range applies only to samples taken after fasting for at least 8 hours. APTT     Status: None  Collection Time: 05/18/20 10:57 AM Result Value Ref Range  aPTT 30 24 - 36 seconds   Comment: Performed at [facility], [address] [place], [place] [address] Basic metabolic panel     Status: Abnormal  Collection Time: 05/18/20 10:57 AM Result Value Ref Range  Sodium 139 135 - 145 mmol/L  Potassium 4.9 3.5 - 5.1 mmol/L  Chloride 105 98 - 111 mmol/L  CO2 26 22 - 32 mmol/L  Glucose, Bld 185 (H) 70 - 99 mg/dL   Comment: Glucose reference range applies only to samples taken after fasting for at least 8 hours.  BUN 55 (H) 8 - 23 mg/dL  Creatinine, Ser 3.25 (H) 0.61 - 1.24 mg/dL  Calcium 8.5 (L) 8.9 - 10.3 mg/dL  GFR, Estimated 20 (L) >60 mL/min   Comment: (NOTE) Calculated using the CKD-EPI Creatinine Equation  (2021)   Anion gap 8 5 - 15   Comment: Performed at [facility], [address] [place], [place] [address] CBC WITH DIFFERENTIAL     Status: Abnormal  Collection Time: 05/18/20 10:57 AM Result Value Ref Range  WBC 8.2 4.0 - 10.5 K/uL  RBC 2.55 (L) 4.22 - 5.81 MIL/uL  Hemoglobin 7.5 (L) 13.0 - 17.0 g/dL  HCT 24.3 (L) 39 - 52 %  MCV 95.3 80.0 - 100.0 fL  MCH 29.4 26.0 - 34.0 pg  MCHC 30.9 30.0 - 36.0 g/dL  RDW 16.6 (H) 11.5 - 15.5 %  Platelets 160 150 - 400 K/uL  nRBC 0.0 0.0 - 0.2 %  Neutrophils Relative % 69 %  Neutro Abs 5.7 1.7 - 7.7 K/uL  Lymphocytes Relative 21 %  Lymphs Abs 1.7 0.7 - 4.0 K/uL  Monocytes Relative 7 %  Monocytes Absolute 0.6 0.1 - 1.0 K/uL  Eosinophils Relative 2 %  Eosinophils Absolute 0.2 0.0 - 0.5 K/uL  Basophils Relative 1 %  Basophils Absolute 0.0 0.0 - 0.1 K/uL  Immature Granulocytes 0 %  Abs Immature Granulocytes 0.03 0.00 - 0.07 K/uL   Comment: Performed at [facility], [address] [place], [place] [address] Protime-INR     Status: None  Collection Time: 05/18/20 10:57 AM Result Value Ref Range  Prothrombin Time 13.0 11.4 - 15.2 seconds  INR 1.0 0.8 - 1.2   Comment: (NOTE) INR goal varies based on device and disease states. Performed at [facility], [address] [place], [place] [address]  Type and screen     Status: None  Collection Time: 05/18/20 10:57 AM Result Value Ref Range  ABO/RH(D) AB POS   Antibody Screen NEG   Sample Expiration 05/21/2020,2359   Extend sample reason TRANSFUSED IN PAST 3 MONTHS, UNABLE TO EXTEND   Unit Number [id number]   Blood Component Type RED CELLS,LR   Unit division 00   Status of Unit ISSUED,FINAL   Transfusion Status OK TO TRANSFUSE   Crossmatch Result     Compatible Performed at [facility], [address] [place], [place] [address]  BPAM RBC     Status: None  Collection Time: 05/18/20 10:57 AM Result Value Ref Range  ISSUE DATE /  TIME [id number]   Blood Product Unit Number [id number]   PRODUCT CODE [id number]   Unit Type and Rh 6200   Blood Product Expiration Date 527782423536  SARS CORONAVIRUS 2 (TAT 6-24 HRS)  Nasopharyngeal Nasopharyngeal Swab     Status: None  Collection Time: 05/18/20 11:54 AM  Specimen: Nasopharyngeal Swab Result Value Ref Range  SARS Coronavirus 2 NEGATIVE NEGATIVE   Comment: (NOTE) SARS-CoV-2 target nucleic acids are NOT DETECTED.  The SARS-CoV-2 RNA is generally detectable in upper and lower respiratory specimens during the acute phase of infection. Negative results do not preclude SARS-CoV-2 infection, do not rule out co-infections with other pathogens, and should not be used as the sole basis for treatment or other patient management decisions. Negative results must be combined with clinical observations, patient history, and epidemiological information. The expected result is Negative.  Fact Sheet for Patients: SugarRoll.be  Fact Sheet for Healthcare Providers: https://www.woods-mathews.com/  This test is not yet approved or cleared by the [place] FDA and  has been authorized for detection and/or diagnosis of SARS-CoV-2 by FDA under an Emergency Use Authorization (EUA). This EUA will remain  in effect (meaning this test can be used) for the duration of the COVID-19 declaration under Se ction 564(b)(1) of the Act, 21 U.S.C. section 360bbb-3(b)(1), unless the authorization is terminated or revoked sooner.  Performed at [facility], [place] [address] [place], [place] [address]  Glucose, capillary     Status: Abnormal  Collection Time: 05/20/20  6:28 AM Result Value Ref Range  Glucose-Capillary 44 (LL) 70 - 99 mg/dL   Comment: Glucose reference range applies only to samples taken after fasting for at least 8 hours.  Comment 1 Notify RN  Glucose, capillary     Status: Abnormal  Collection Time: 05/20/20  6:49  AM Result Value Ref Range  Glucose-Capillary 66 (L) 70 - 99 mg/dL   Comment: Glucose reference range applies only to samples taken after fasting for at least 8 hours. Glucose, capillary     Status: Abnormal  Collection Time: 05/20/20  7:12 AM Result Value Ref Range  Glucose-Capillary 114 (H) 70 - 99 mg/dL   Comment: Glucose reference range applies only to samples taken after fasting for at least 8 hours. Glucose, capillary     Status: None  Collection Time: 05/20/20  8:29 AM Result Value Ref Range  Glucose-Capillary 73 70 - 99 mg/dL   Comment: Glucose reference range applies only to samples taken after fasting for at least 8 hours. Prepare RBC (crossmatch)     Status: None  Collection Time: 05/20/20  8:41 AM Result Value Ref Range  Order Confirmation     ORDER PROCESSED BY BLOOD BANK Performed at [facility], [address] [place], [place] [address]  Glucose, capillary     Status: Abnormal  Collection Time: 05/20/20  8:55 AM Result Value Ref Range  Glucose-Capillary 119 (H) 70 - 99 mg/dL   Comment: Glucose reference range applies only to samples taken after fasting for at least 8 hours. Prepare RBC (crossmatch)     Status: None  Collection Time: 05/20/20  9:12 AM Result Value Ref Range  Order Confirmation     DUPLICATE REQUEST Performed at [facility], [address] [place], [place] [address]  Blood gas, arterial     Status: Abnormal  Collection Time: 05/20/20 10:00 AM Result Value Ref Range  FIO2 0.24   Inspiratory PAP 12   Expiratory PAP 6   pH, Arterial 7.39 7.35 - 7.45  pCO2 arterial 38 32 - 48 mmHg  pO2, Arterial 110 (H) 83 - 108 mmHg  Bicarbonate 23.0 20.0 - 28.0 mmol/L  Acid-base deficit 1.8 0.0 - 2.0 mmol/L  O2  Saturation 98.2 %  Patient temperature 37.0   Collection site RIGHT RADIAL   Sample type ARTERIAL DRAW   [place] test (pass/fail) PASS PASS   Comment: Performed at [facility], [address] [place], [place]  [address] Glucose, capillary     Status: Abnormal  Collection Time: 05/20/20 11:26 AM Result Value Ref Range  Glucose-Capillary 68 (L) 70 - 99 mg/dL   Comment: Glucose reference range applies only to samples taken after fasting for at least 8 hours. Glucose, capillary     Status: None  Collection Time: 05/20/20 12:14 PM Result Value Ref Range  Glucose-Capillary 89 70 - 99 mg/dL   Comment: Glucose reference range applies only to samples taken after fasting for at least 8 hours. Troponin I (High Sensitivity)     Status: Abnormal  Collection Time: 05/20/20  3:09 PM Result Value Ref Range  Troponin I (High Sensitivity) 70 (H) <18 ng/L   Comment: (NOTE) Elevated high sensitivity troponin I (hsTnI) values and significant  changes across serial measurements may suggest ACS but many other  chronic and acute conditions are known to elevate hsTnI results.  Refer to the "Links" section for chest pain algorithms and additional  guidance. Performed at [facility], [address] [place], [place] [address]  Glucose, capillary     Status: Abnormal  Collection Time: 05/20/20  3:23 PM Result Value Ref Range  Glucose-Capillary 137 (H) 70 - 99 mg/dL   Comment: Glucose reference range applies only to samples taken after fasting for at least 8 hours. Troponin I (High Sensitivity)     Status: Abnormal  Collection Time: 05/20/20  5:27 PM Result Value Ref Range  Troponin I (High Sensitivity) 72 (H) <18 ng/L   Comment: (NOTE) Elevated high sensitivity troponin I (hsTnI) values and significant  changes across serial measurements may suggest ACS but many other  chronic and acute conditions are known to elevate hsTnI results.  Refer to the "Links" section for chest pain algorithms and additional  guidance. Performed at [facility], [address] [place], [place] [address]  Glucose, capillary     Status: Abnormal  Collection Time: 05/20/20  7:13 PM Result Value Ref  Range  Glucose-Capillary 164 (H) 70 - 99 mg/dL   Comment: Glucose reference range applies only to samples taken after fasting for at least 8 hours. Glucose, capillary     Status: Abnormal  Collection Time: 05/20/20 10:42 PM Result Value Ref Range  Glucose-Capillary 142 (H) 70 - 99 mg/dL   Comment: Glucose reference range applies only to samples taken after fasting for at least 8 hours. Glucose, capillary     Status: Abnormal  Collection Time: 05/21/20  5:02 AM Result Value Ref Range  Glucose-Capillary 149 (H) 70 - 99 mg/dL   Comment: Glucose reference range applies only to samples taken after fasting for at least 8 hours. Basic metabolic panel     Status: Abnormal  Collection Time: 05/21/20  6:11 AM Result Value Ref Range  Sodium 135 135 - 145 mmol/L  Potassium 3.9 3.5 - 5.1 mmol/L  Chloride 102 98 - 111 mmol/L  CO2 22 22 - 32 mmol/L  Glucose, Bld 153 (H) 70 - 99 mg/dL   Comment: Glucose reference range applies only to samples taken after fasting for at least 8 hours.  BUN 71 (H) 8 - 23 mg/dL  Creatinine, Ser 3.79 (H) 0.61 - 1.24 mg/dL  Calcium 7.9 (L) 8.9 - 10.3 mg/dL  GFR, Estimated 17 (L) >60 mL/min  Comment: (NOTE) Calculated using the CKD-EPI Creatinine Equation (2021)   Anion gap 11 5 - 15   Comment: Performed at [facility], [address] [place], [place] [address] Glucose, capillary     Status: Abnormal  Collection Time: 05/21/20  7:54 AM Result Value Ref Range  Glucose-Capillary 158 (H) 70 - 99 mg/dL   Comment: Glucose reference range applies only to samples taken after fasting for at least 8 hours. Glucose, capillary     Status: Abnormal  Collection Time: 05/21/20 11:39 AM Result Value Ref Range  Glucose-Capillary 127 (H) 70 - 99 mg/dL   Comment: Glucose reference range applies only to samples taken after fasting for at least 8 hours. Glucose, capillary     Status: Abnormal  Collection Time: 05/21/20  4:38 PM Result Value Ref  Range  Glucose-Capillary 214 (H) 70 - 99 mg/dL   Comment: Glucose reference range applies only to samples taken after fasting for at least 8 hours. Glucose, capillary     Status: Abnormal  Collection Time: 05/21/20  9:08 PM Result Value Ref Range  Glucose-Capillary 220 (H) 70 - 99 mg/dL   Comment: Glucose reference range applies only to samples taken after fasting for at least 8 hours. Basic metabolic panel     Status: Abnormal  Collection Time: 05/22/20  5:17 AM Result Value Ref Range  Sodium 136 135 - 145 mmol/L  Potassium 4.0 3.5 - 5.1 mmol/L  Chloride 100 98 - 111 mmol/L  CO2 24 22 - 32 mmol/L  Glucose, Bld 203 (H) 70 - 99 mg/dL   Comment: Glucose reference range applies only to samples taken after fasting for at least 8 hours.  BUN 78 (H) 8 - 23 mg/dL  Creatinine, Ser 3.94 (H) 0.61 - 1.24 mg/dL  Calcium 7.9 (L) 8.9 - 10.3 mg/dL  GFR, Estimated 16 (L) >60 mL/min   Comment: (NOTE) Calculated using the CKD-EPI Creatinine Equation (2021)   Anion gap 12 5 - 15   Comment: Performed at [facility], [address] [place], [place] [address] Glucose, capillary     Status: Abnormal  Collection Time: 05/22/20  7:36 AM Result Value Ref Range  Glucose-Capillary 159 (H) 70 - 99 mg/dL   Comment: Glucose reference range applies only to samples taken after fasting for at least 8 hours. CBC with Differential/Platelet     Status: Abnormal  Collection Time: 05/22/20 11:19 AM Result Value Ref Range  WBC 6.2 4.0 - 10.5 K/uL  RBC 2.01 (L) 4.22 - 5.81 MIL/uL  Hemoglobin 5.9 (L) 13.0 - 17.0 g/dL  HCT 18.9 (L) 39 - 52 %  MCV 94.0 80.0 - 100.0 fL  MCH 29.4 26.0 - 34.0 pg  MCHC 31.2 30.0 - 36.0 g/dL  RDW 17.0 (H) 11.5 - 15.5 %  Platelets 121 (L) 150 - 400 K/uL  nRBC 0.0 0.0 - 0.2 %  Neutrophils Relative % 67 %  Neutro Abs 4.2 1.7 - 7.7 K/uL  Lymphocytes Relative 22 %  Lymphs Abs 1.4 0.7 - 4.0 K/uL  Monocytes Relative 9 %  Monocytes Absolute 0.5 0.1 - 1.0 K/uL  Eosinophils  Relative 2 %  Eosinophils Absolute 0.1 0.0 - 0.5 K/uL  Basophils Relative 0 %  Basophils Absolute 0.0 0.0 - 0.1 K/uL  Immature Granulocytes 0 %  Abs Immature Granulocytes 0.02 0.00 - 0.07 K/uL   Comment: Performed at [facility], [address] [place], [place] [address] Glucose, capillary     Status: Abnormal  Collection Time: 05/22/20  11:30 AM Result Value Ref Range  Glucose-Capillary 174 (H) 70 - 99 mg/dL   Comment: Glucose reference range applies only to samples taken after fasting for at least 8 hours. Prepare RBC (crossmatch)     Status: None  Collection Time: 05/22/20  1:36 PM Result Value Ref Range  Order Confirmation     ORDER PROCESSED BY BLOOD BANK Performed at [facility], [address] [place], [place] [address]  Iron and TIBC     Status: Abnormal  Collection Time: 05/22/20  1:52 PM Result Value Ref Range  Iron 43 (L) 45 - 182 ug/dL  TIBC 441 250 - 450 ug/dL  Saturation Ratios 10 (L) 17.9 - 39.5 %  UIBC 398 ug/dL   Comment: Performed at [facility], [address] [place], [place] [address] Ferritin     Status: None  Collection Time: 05/22/20  1:52 PM Result Value Ref Range  Ferritin 31 24 - 336 ng/mL   Comment: Performed at [facility], [address] [place], [place] [address] Type and screen [place]     Status: None  Collection Time: 05/22/20  1:52 PM Result Value Ref Range  ABO/RH(D) AB POS   Antibody Screen NEG   Sample Expiration 05/25/2020,2359   Unit Number [id number]   Blood Component Type RED CELLS,LR   Unit division 00   Status of Unit ISSUED,FINAL   Transfusion Status OK TO TRANSFUSE   Crossmatch Result Compatible   Unit Number [id number]   Blood Component Type RED CELLS,LR   Unit division 00   Status of Unit ISSUED,FINAL   Transfusion Status OK TO TRANSFUSE   Crossmatch Result     Compatible Performed at [facility], [address] [place],  [place] [address]  BPAM RBC     Status: None  Collection Time: 05/22/20  1:52 PM Result Value Ref Range  ISSUE DATE / TIME [id number]   Blood Product Unit Number [id number]   PRODUCT CODE [id number]   Unit Type and Rh 0600   Blood Product Expiration Date 202111102359   ISSUE DATE / TIME [id number]   Blood Product Unit Number [id number]   PRODUCT CODE [id number]   Unit Type and Rh 6200   Blood Product Expiration Date 372902111552  Glucose, capillary     Status: Abnormal  Collection Time: 05/22/20  4:33 PM Result Value Ref Range  Glucose-Capillary 176 (H) 70 - 99 mg/dL   Comment: Glucose reference range applies only to samples taken after fasting for at least 8 hours. Glucose, capillary     Status: Abnormal  Collection Time: 05/22/20  9:21 PM Result Value Ref Range  Glucose-Capillary 202 (H) 70 - 99 mg/dL   Comment: Glucose reference range applies only to samples taken after fasting for at least 8 hours. Basic metabolic panel     Status: Abnormal  Collection Time: 05/23/20  4:29 AM Result Value Ref Range  Sodium 135 135 - 145 mmol/L  Potassium 3.6 3.5 - 5.1 mmol/L  Chloride 98 98 - 111 mmol/L  CO2 26 22 - 32 mmol/L  Glucose, Bld 140 (H) 70 - 99 mg/dL   Comment: Glucose reference range applies only to samples taken after fasting for at least 8 hours.  BUN 81 (H) 8 - 23 mg/dL  Creatinine, Ser 3.78 (H) 0.61 - 1.24 mg/dL  Calcium 7.7 (L) 8.9 - 10.3 mg/dL  GFR, Estimated 17 (L) >60 mL/min   Comment: (NOTE) Calculated using the CKD-EPI Creatinine Equation (2021)  Anion gap 11 5 - 15   Comment: Performed at [facility], [address] [place], [place] [address] CBC with Differential/Platelet     Status: Abnormal  Collection Time: 05/23/20  4:29 AM Result Value Ref Range  WBC 6.8 4.0 - 10.5 K/uL  RBC 2.84 (L) 4.22 - 5.81 MIL/uL  Hemoglobin 8.5 (L) 13.0 - 17.0 g/dL   Comment: REPEATED TO VERIFY POST TRANSFUSION SPECIMEN   HCT 25.8 (L) 39 - 52 %  MCV 90.8 80.0 -  100.0 fL  MCH 29.9 26.0 - 34.0 pg  MCHC 32.9 30.0 - 36.0 g/dL  RDW 17.8 (H) 11.5 - 15.5 %  Platelets 128 (L) 150 - 400 K/uL  nRBC 0.0 0.0 - 0.2 %  Neutrophils Relative % 65 %  Neutro Abs 4.3 1.7 - 7.7 K/uL  Lymphocytes Relative 24 %  Lymphs Abs 1.7 0.7 - 4.0 K/uL  Monocytes Relative 9 %  Monocytes Absolute 0.6 0.1 - 1.0 K/uL  Eosinophils Relative 2 %  Eosinophils Absolute 0.2 0.0 - 0.5 K/uL  Basophils Relative 0 %  Basophils Absolute 0.0 0.0 - 0.1 K/uL  Immature Granulocytes 0 %  Abs Immature Granulocytes 0.03 0.00 - 0.07 K/uL   Comment: Performed at [facility], [address] [place], [place] [address] Glucose, capillary     Status: Abnormal  Collection Time: 05/23/20  7:34 AM Result Value Ref Range  Glucose-Capillary 154 (H) 70 - 99 mg/dL   Comment: Glucose reference range applies only to samples taken after fasting for at least 8 hours. Glucose, capillary     Status: Abnormal  Collection Time: 05/23/20 11:52 AM Result Value Ref Range  Glucose-Capillary 132 (H) 70 - 99 mg/dL   Comment: Glucose reference range applies only to samples taken after fasting for at least 8 hours. Glucose, capillary     Status: Abnormal  Collection Time: 05/23/20  4:37 PM Result Value Ref Range  Glucose-Capillary 152 (H) 70 - 99 mg/dL   Comment: Glucose reference range applies only to samples taken after fasting for at least 8 hours. Glucose, capillary     Status: Abnormal  Collection Time: 05/23/20  8:46 PM Result Value Ref Range  Glucose-Capillary 222 (H) 70 - 99 mg/dL   Comment: Glucose reference range applies only to samples taken after fasting for at least 8 hours. Basic metabolic panel     Status: Abnormal  Collection Time: 05/24/20  6:17 AM Result Value Ref Range  Sodium 135 135 - 145 mmol/L  Potassium 3.9 3.5 - 5.1 mmol/L  Chloride 96 (L) 98 - 111 mmol/L  CO2 24 22 - 32 mmol/L  Glucose, Bld 151 (H) 70 - 99 mg/dL   Comment: Glucose reference range applies only to samples  taken after fasting for at least 8 hours.  BUN 83 (H) 8 - 23 mg/dL  Creatinine, Ser 3.84 (H) 0.61 - 1.24 mg/dL  Calcium 8.2 (L) 8.9 - 10.3 mg/dL  GFR, Estimated 17 (L) >60 mL/min   Comment: (NOTE) Calculated using the CKD-EPI Creatinine Equation (2021)   Anion gap 15 5 - 15   Comment: Performed at [facility], [address] [place], [place] [address] CBC with Differential/Platelet     Status: Abnormal  Collection Time: 05/24/20  6:17 AM Result Value Ref Range  WBC 7.1 4.0 - 10.5 K/uL  RBC 3.06 (L) 4.22 - 5.81 MIL/uL  Hemoglobin 9.0 (L) 13.0 - 17.0 g/dL  HCT 27.8 (L) 39 - 52 %  MCV 90.8 80.0 -  100.0 fL  MCH 29.4 26.0 - 34.0 pg  MCHC 32.4 30.0 - 36.0 g/dL  RDW 17.3 (H) 11.5 - 15.5 %  Platelets 147 (L) 150 - 400 K/uL  nRBC 0.0 0.0 - 0.2 %  Neutrophils Relative % 66 %  Neutro Abs 4.7 1.7 - 7.7 K/uL  Lymphocytes Relative 22 %  Lymphs Abs 1.5 0.7 - 4.0 K/uL  Monocytes Relative 9 %  Monocytes Absolute 0.6 0.1 - 1.0 K/uL  Eosinophils Relative 3 %  Eosinophils Absolute 0.2 0.0 - 0.5 K/uL  Basophils Relative 0 %  Basophils Absolute 0.0 0.0 - 0.1 K/uL  Immature Granulocytes 0 %  Abs Immature Granulocytes 0.02 0.00 - 0.07 K/uL   Comment: Performed at [facility], [address], [place], [place] [address] Magnesium     Status: None  Collection Time: 05/24/20  6:17 AM Result Value Ref Range  Magnesium 2.2 1.7 - 2.4 mg/dL   Comment: Performed at [facility], [address] [place], [place] [address] Protime-INR     Status: None  Collection Time: 05/24/20  6:17 AM Result Value Ref Range  Prothrombin Time 13.2 11.4 - 15.2 seconds  INR 1.0 0.8 - 1.2   Comment: (NOTE) INR goal varies based on device and disease states. Performed at [facility], [address] [place], [place] [address]  APTT     Status: None  Collection Time: 05/24/20  6:17 AM Result Value Ref Range  aPTT 35 24 - 36 seconds   Comment: Performed at [facility], [address] [place], [place] [address] Type and screen     Status: None  Collection Time: 05/24/20  7:23 AM Result Value Ref Range  ABO/RH(D) AB POS   Antibody Screen NEG   Sample Expiration     05/27/2020,2359 Performed at [facility], [address] [place], [place] [address]  Glucose, capillary     Status: Abnormal  Collection Time: 05/24/20  7:34 AM Result Value Ref Range  Glucose-Capillary 141 (H) 70 - 99 mg/dL   Comment: Glucose reference range applies only to samples taken after fasting for at least 8 hours. Glucose, capillary     Status: Abnormal  Collection Time: 05/24/20 12:00 PM Result Value Ref Range  Glucose-Capillary 139 (H) 70 - 99 mg/dL   Comment: Glucose reference range applies only to samples taken after fasting for at least 8 hours. Glucose, capillary     Status: Abnormal  Collection Time: 05/24/20  3:48 PM Result Value Ref Range  Glucose-Capillary 119 (H) 70 - 99 mg/dL   Comment: Glucose reference range applies only to samples taken after fasting for at least 8 hours. Glucose, capillary     Status: Abnormal  Collection Time: 05/24/20  4:55 PM Result Value Ref Range  Glucose-Capillary 124 (H) 70 - 99 mg/dL   Comment: Glucose reference range applies only to samples taken after fasting for at least 8 hours. Glucose, capillary     Status: Abnormal  Collection Time: 05/24/20  8:36 PM Result Value Ref Range  Glucose-Capillary 144 (H) 70 - 99 mg/dL   Comment: Glucose reference range applies only to samples taken after fasting for at least 8 hours. Basic metabolic panel     Status: Abnormal  Collection Time: 05/25/20  6:33 AM Result Value Ref Range  Sodium 135 135 - 145 mmol/L  Potassium 3.7 3.5 - 5.1 mmol/L  Chloride 98 98 - 111 mmol/L  CO2 24 22 - 32 mmol/L  Glucose, Bld 232 (  H) 70 - 99 mg/dL   Comment: Glucose reference range applies only to samples taken after fasting for at least 8 hours.  BUN 82 (H) 8 - 23 mg/dL  Creatinine,  Ser 3.64 (H) 0.61 - 1.24 mg/dL  Calcium 8.3 (L) 8.9 - 10.3 mg/dL  GFR, Estimated 18 (L) >60 mL/min   Comment: (NOTE) Calculated using the CKD-EPI Creatinine Equation (2021)   Anion gap 13 5 - 15   Comment: Performed at [facility], [address] [place], [place] [address] Glucose, capillary     Status: Abnormal  Collection Time: 05/25/20  8:18 AM Result Value Ref Range  Glucose-Capillary 205 (H) 70 - 99 mg/dL   Comment: Glucose reference range applies only to samples taken after fasting for at least 8 hours. Glucose, capillary     Status: Abnormal  Collection Time: 05/25/20 11:39 AM Result Value Ref Range  Glucose-Capillary 145 (H) 70 - 99 mg/dL   Comment: Glucose reference range applies only to samples taken after fasting for at least 8 hours. Glucose, capillary     Status: Abnormal  Collection Time: 05/25/20  4:17 PM Result Value Ref Range  Glucose-Capillary 213 (H) 70 - 99 mg/dL   Comment: Glucose reference range applies only to samples taken after fasting for at least 8 hours. Glucose, capillary     Status: Abnormal  Collection Time: 05/25/20  8:38 PM Result Value Ref Range  Glucose-Capillary 130 (H) 70 - 99 mg/dL   Comment: Glucose reference range applies only to samples taken after fasting for at least 8 hours. Basic metabolic panel     Status: Abnormal  Collection Time: 05/26/20  6:29 AM Result Value Ref Range  Sodium 133 (L) 135 - 145 mmol/L  Potassium 3.5 3.5 - 5.1 mmol/L  Chloride 96 (L) 98 - 111 mmol/L  CO2 25 22 - 32 mmol/L  Glucose, Bld 161 (H) 70 - 99 mg/dL   Comment: Glucose reference range applies only to samples taken after fasting for at least 8 hours.  BUN 87 (H) 8 - 23 mg/dL  Creatinine, Ser 3.66 (H) 0.61 - 1.24 mg/dL  Calcium 8.2 (L) 8.9 - 10.3 mg/dL  GFR, Estimated 17 (L) >60 mL/min   Comment: (NOTE) Calculated using the CKD-EPI Creatinine Equation (2021)   Anion gap 12 5 - 15   Comment: Performed at [facility], [address] [place], [place] [address] Magnesium     Status: Abnormal  Collection Time: 05/26/20  6:29 AM Result Value Ref Range  Magnesium 2.5 (H) 1.7 - 2.4 mg/dL   Comment: Performed at [facility], [address] [place], [place] [address] Glucose, capillary     Status: Abnormal  Collection Time: 05/26/20  8:43 AM Result Value Ref Range  Glucose-Capillary 152 (H) 70 - 99 mg/dL   Comment: Glucose reference range applies only to samples taken after fasting for at least 8 hours. CBC     Status: Abnormal  Collection Time: 06/01/20  1:26 PM Result Value Ref Range  WBC 11.2 (H) 4.0 - 10.5 K/uL  RBC 2.88 (L) 4.22 - 5.81 Mil/uL  Platelets 246.0 150 - 400 K/uL  Hemoglobin 8.3 Repeated and verified X2. (L) 13.0 - 17.0 g/dL  HCT 25.6 Repeated and verified X2. (L) 39 - 52 %  MCV 88.8 78.0 - 100.0 fl  MCHC 32.5 30.0 - 36.0 g/dL  RDW 16.7 (H) 11.5 - 15.5 % Renal function panel     Status: Abnormal  Collection Time: 06/01/20  1:26 PM Result Value Ref Range  Sodium 132 (L) 135 - 145 mEq/L  Potassium 4.8 3.5 - 5.1 mEq/L  Chloride 92 (L) 96 - 112 mEq/L  CO2 28 19 - 32 mEq/L  Albumin 3.6 3.5 - 5.2 g/dL  BUN 87 (HH) 6 - 23 mg/dL  Creatinine, Ser 3.83 (H) 0.40 - 1.50 mg/dL  Glucose, Bld 163 (H) 70 - 99 mg/dL  Phosphorus 7.6 (H) 2.3 - 4.6 mg/dL  GFR 15.64 (L) >60.00 mL/min   Comment: Calculated using the CKD-EPI Creatinine Equation (2021)  Calcium 8.5 8.4 - 10.5 mg/dL CBC     Status: Abnormal  Collection Time: 06/02/20  7:14 PM Result Value Ref Range  WBC 9.1 4.0 - 10.5 K/uL  RBC 2.70 (L) 4.22 - 5.81 MIL/uL  Hemoglobin 7.9 (L) 13.0 - 17.0 g/dL  HCT 25.4 (L) 39 - 52 %  MCV 94.1 80.0 - 100.0 fL  MCH 29.3 26.0 - 34.0 pg  MCHC 31.1 30.0 - 36.0 g/dL  RDW 16.5 (H) 11.5 - 15.5 %  Platelets 225 150 - 400 K/uL  nRBC 0.0 0.0 - 0.2 %   Comment: Performed at [facility], [address] [place], [place] [address] Comprehensive metabolic panel     Status: Abnormal  Collection Time:  06/02/20  7:14 PM Result Value Ref Range  Sodium 132 (L) 135 - 145 mmol/L  Potassium 3.6 3.5 - 5.1 mmol/L  Chloride 93 (L) 98 - 111 mmol/L  CO2 27 22 - 32 mmol/L  Glucose, Bld 208 (H) 70 - 99 mg/dL   Comment: Glucose reference range applies only to samples taken after fasting for at least 8 hours.  BUN 93 (H) 8 - 23 mg/dL  Creatinine, Ser 3.61 (H) 0.61 - 1.24 mg/dL  Calcium 8.2 (L) 8.9 - 10.3 mg/dL  Total Protein 7.9 6.5 - 8.1 g/dL  Albumin 3.3 (L) 3.5 - 5.0 g/dL  AST 21 15 - 41 U/L  ALT 20 0 - 44 U/L  Alkaline Phosphatase 80 38 - 126 U/L  Total Bilirubin 0.7 0.3 - 1.2 mg/dL  GFR, Estimated 18 (L) >60 mL/min   Comment: (NOTE) Calculated using the CKD-EPI Creatinine Equation (2021)   Anion gap 12 5 - 15   Comment: Performed at [facility], [address], [place], [place] [address]   Radiology CT CORONARY [facility] W/CTA COR W/SCORE W/CA W/CM &/OR WO/CM  Addendum Date: 05/10/2020   ADDENDUM REPORT: 05/10/2020 09:41 CLINICAL DATA:  64 year old male with h/o CAD, ischemic cardiomyopathy and aortic stenosis being evaluated for a TAVR procedure. EXAM: Cardiac TAVR CT TECHNIQUE: The patient was scanned on a [employer]. A 120 kV retrospective scan was triggered in the descending thoracic aorta at 111 HU's. Gantry rotation speed was 250 msecs and collimation was .6 mm. 12.5 mg of PO Carvedilol and no nitro were given. The 3D data set was reconstructed in 5% intervals of the R-R cycle. Systolic and diastolic phases were analyzed on a dedicated work station using MPR, MIP and VRT modes. The patient received 80 cc of contrast. FINDINGS: Aortic Valve: Severely calcified trileaflet aortic valve with severely restricted leaflet openings and no calcifications extending into the LVOT. Aortic vale calcium score is 2534 consistent with severe aortic stenosis. Aorta: There is mild ascending aortic aneurysm with maximum diameter 42.5 mm. There is diffuse atherosclerotic plaque and  calcifications without dissection. Sinotubular Junction: 32 x 32 mm Ascending Thoracic Aorta: 42.5 x 40.7 mm Aortic Arch: 32 x 32 mm Descending Thoracic  Aorta: 29 x 28 mm Sinus of Valsalva Measurements: Non-coronary: 37 mm Right -coronary: 35 mm Left -coronary: 35 mm Coronary Artery Height above Annulus: Left Main: 15 mm Right Coronary: 21 mm Virtual Basal Annulus Measurements: Maximum/Minimum Diameter: 30.9 x 25.1 mm Mean Diameter: 26.8 mm Perimeter: 86.1 mm Area: 563 mm2 Optimum Fluoroscopic Angle for Delivery: RAO 2 CAU 0 IMPRESSION: 1. Severely calcified trileaflet aortic valve with severely restricted leaflet openings and no calcifications extending into the LVOT. Aortic vale calcium score is 2534 consistent with severe aortic stenosis. Aortic measurements suitable for delivery of a 29 mm Edwards-SAPIEN 3 valve. 2. Sufficient coronary to annulus distance. 3. There is mild ascending aortic aneurysm with maximum diameter 42.5 mm. There is diffuse atherosclerotic plaque and calcifications without dissection. 4. Optimum Fluoroscopic Angle for Delivery:  RAO 2 CAU 0 5. No thrombus in the left atrial appendage. Electronically Signed   By: Ena Dawley   On: 05/10/2020 09:41   Result Date: 05/10/2020 EXAM: OVER-READ INTERPRETATION  CT CHEST The following report is an over-read performed by radiologist Dr. Vinnie Langton of [facility], [place] on 05/05/2020. This over-read does not include interpretation of cardiac or coronary anatomy or pathology. The coronary calcium score/coronary CTA interpretation by the cardiologist is attached. COMPARISON:  Chest CT 07/01/2019. FINDINGS: Extracardiac findings will be described separately under dictation for contemporaneously obtained CTA chest, abdomen and pelvis. IMPRESSION: Please see separate dictation for contemporaneously obtained CTA chest, abdomen and pelvis dated 05/05/2020 for full description of relevant extracardiac findings. Electronically Signed: By:  Vinnie Langton M.D. On: 05/05/2020 12:30   DG Chest Portable 1 View  Result Date: 05/20/2020 CLINICAL DATA:  Shortness of breath EXAM: PORTABLE CHEST 1 VIEW COMPARISON:  04/09/2020 FINDINGS: Post CABG changes. Stable cardiomediastinal contours. Pulmonary vascular congestion mild diffuse interstitial opacities throughout both lungs. Suspect trace bilateral pleural effusions. No pneumothorax. IMPRESSION: Findings suggestive of CHF with mild pulmonary edema. These results will be called to the ordering clinician or representative by the Radiologist Assistant, and communication documented in the PACS or [employer]. Electronically Signed   By: Davina Poke D.O.   On: 05/20/2020 08:19   [place] OR NERVE BLOCK-IMAGE ONLY [facility])  Result Date: 05/24/2020 There is no interpretation for this exam.  This order is for images obtained during a surgical procedure.  Please See "Surgeries" Tab for more information regarding the procedure.   [place] OR NERVE BLOCK-IMAGE ONLY [facility])  Result Date: 05/20/2020 There is no interpretation for this exam.  This order is for images obtained during a surgical procedure.  Please See "Surgeries" Tab for more information regarding the procedure.   CT ANGIO CHEST AORTA W/CM & OR WO/CM  Addendum Date: 05/07/2020   ADDENDUM REPORT: 05/07/2020 12:52 CLINICAL DATA:  64 year old male with h/o CAD, ischemic cardiomyopathy and aortic stenosis being evaluated for a TAVR procedure. EXAM: Cardiac TAVR CT TECHNIQUE: The patient was scanned on a [employer]. A 120 kV retrospective scan was triggered in the descending thoracic aorta at 111 HU's. Gantry rotation speed was 250 msecs and collimation was .6 mm. 12.5 mg of PO Carvedilol and no nitro were given. The 3D data set was reconstructed in 5% intervals of the R-R cycle. Systolic and diastolic phases were analyzed on a dedicated work station using MPR, MIP and VRT modes. The patient received 80 cc of contrast. FINDINGS: Aortic  Valve: Severely calcified trileaflet aortic valve with severely restricted leaflet openings and no calcifications extending into the LVOT. Aortic vale calcium score  is 2534 consistent with severe aortic stenosis. Aorta: There is mild ascending aortic aneurysm with maximum diameter 42.5 mm. There is diffuse atherosclerotic plaque and calcifications without dissection. Sinotubular Junction: 32 x 32 mm Ascending Thoracic Aorta: 42.5 x 40.7 mm Aortic Arch: 32 x 32 mm Descending Thoracic Aorta: 29 x 28 mm Sinus of Valsalva Measurements: Non-coronary: 37 mm Right -coronary: 35 mm Left -coronary: 35 mm Coronary Artery Height above Annulus: Left Main: 15 mm Right Coronary: 21 mm Virtual Basal Annulus Measurements: Maximum/Minimum Diameter: 30.9 x 25.1 mm Mean Diameter: 26.8 mm Perimeter: 86.1 mm Area: 563 mm2 Optimum Fluoroscopic Angle for Delivery: RAO 2 CAU 0 IMPRESSION: 1. Severely calcified trileaflet aortic valve with severely restricted leaflet openings and no calcifications extending into the LVOT. Aortic vale calcium score is 2534 consistent with severe aortic stenosis. Aortic measurements suitable for delivery of a 29 mm Edwards-SAPIEN 3 valve. 2. Sufficient coronary to annulus distance. 3. There is mild ascending aortic aneurysm with maximum diameter 42.5 mm. There is diffuse atherosclerotic plaque and calcifications without dissection. 4. Optimum Fluoroscopic Angle for Delivery:  RAO 2 CAU 0 5. No thrombus in the left atrial appendage. Electronically Signed   By: Ena Dawley   On: 05/07/2020 12:52   Result Date: 05/07/2020 CLINICAL DATA:  64 year old male with history of severe aortic stenosis. Preprocedural study prior to potential transcatheter aortic valve replacement (TAVR) procedure. EXAM: CT ANGIOGRAPHY CHEST, ABDOMEN AND PELVIS TECHNIQUE: Multidetector CT imaging through the chest, abdomen and pelvis was performed using the standard protocol during bolus administration of intravenous contrast.  Multiplanar reconstructed images and MIPs were obtained and reviewed to evaluate the vascular anatomy. CONTRAST:  43mL OMNIPAQUE IOHEXOL 350 MG/ML SOLN COMPARISON:  Chest CT 07/01/2019. FINDINGS: CTA CHEST FINDINGS Cardiovascular: Heart size is mildly enlarged. There is no significant pericardial fluid, thickening or pericardial calcification. There is aortic atherosclerosis, as well as atherosclerosis of the great vessels of the mediastinum and the coronary arteries, including calcified atherosclerotic plaque in the left main, left anterior descending, left circumflex and right coronary arteries. Status post median sternotomy for CABG including LIMA to the LAD. Severe thickening calcification of the aortic valve. Mediastinum/Lymph Nodes: No pathologically enlarged mediastinal or hilar lymph nodes. Esophagus is unremarkable in appearance. No axillary lymphadenopathy. Lungs/Pleura: Small bilateral pleural effusions lying dependently. Widespread areas of ground-glass attenuation and interlobular septal thickening noted throughout the lungs bilaterally, suggesting a background of very mild interstitial pulmonary edema. No acute consolidative airspace disease. Musculoskeletal/Soft Tissues: Median sternotomy wires. Old healed fracture of the lateral aspect of the right sixth rib incidentally noted. There are no aggressive appearing lytic or blastic lesions noted in the visualized portions of the skeleton. CTA ABDOMEN AND PELVIS FINDINGS Hepatobiliary: Liver has a shrunken appearance and nodular contour, indicative of underlying cirrhosis. Numerous tiny calcified granulomas are scattered throughout the hepatic parenchyma. No suspicious appearing hepatic lesions. No intra or extrahepatic biliary ductal dilatation. Tiny calcified gallstone lying dependently in the gallbladder. No findings to suggest an acute cholecystitis at this time. Pancreas: No pancreatic mass. No pancreatic ductal dilatation. No pancreatic or  peripancreatic fluid collections or inflammatory changes. Spleen: Small calcified granulomas in the spleen. Adrenals/Urinary Tract: Atrophy of the kidneys bilaterally, where there are innumerable low-attenuation lesions, most of which are subcentimeter in size and too small to definitively characterize, but favored to represent cysts. The largest of these are compatible with simple cysts, measuring up to 3.4 cm in the lower pole of the left kidney. Multiple nonobstructive calculi are noted  within the collecting systems of both kidneys measuring up to 1.3 cm in diameter in the lower pole collecting system of the left kidney. No hydroureteronephrosis. Urinary bladder is unremarkable in appearance. Bilateral adrenal glands are normal in appearance. Stomach/Bowel: Normal appearance of the stomach. No pathologic dilatation of small bowel or colon. Several colonic diverticulae are noted, particularly in the descending colon and sigmoid colon, without surrounding inflammatory changes to suggest an acute diverticulitis at this time. Normal appendix. Vascular/Lymphatic: Aortic atherosclerosis, without evidence of aneurysm or dissection in the abdominal or pelvic vasculature. Vascular findings and measurements pertinent to potential TAVR procedure, as detailed below. No lymphadenopathy noted in the abdomen or pelvis. Reproductive: Prostate gland and seminal vesicles are unremarkable in appearance. Other: No significant volume of ascites.  No pneumoperitoneum. Musculoskeletal: Compression fracture of superior endplate of L2 with 40% loss of anterior vertebral body height. There are no aggressive appearing lytic or blastic lesions noted in the visualized portions of the skeleton. VASCULAR MEASUREMENTS PERTINENT TO TAVR: AORTA: Minimal Aortic Diameter-11 x 6 mm Severity of Aortic Calcification-severe RIGHT PELVIS: Right Common Iliac Artery - Minimal Diameter-4.0 x 3.4 mm Tortuosity-mild Calcification-severe Right External Iliac  Artery - Minimal Diameter-2.9 x 2.9 mm Tortuosity - mild Calcification - severe Right Common Femoral Artery - Minimal Diameter-4.2 x 3.3 mm Tortuosity - mild Calcification - severe LEFT PELVIS: Left Common Iliac Artery - Minimal Diameter-2.4 x 1.5 mm Tortuosity - mild Calcification - severe Left External Iliac Artery - Minimal Diameter - Patent lumen not confidently identified Tortuosity - mild Calcification - severe Left Common Femoral Artery - Minimal Diameter-4.0 x 3.5 mm Tortuosity - mild Calcification - severe Review of the MIP images confirms the above findings. IMPRESSION: 1. Vascular findings and measurements pertinent to potential TAVR procedure, as detailed above. 2. Severe thickening calcification of the aortic valve, compatible with reported clinical history of severe aortic stenosis. 3. Aortic atherosclerosis, in addition to left main and 3 vessel coronary artery disease. Status post median sternotomy for CABG including LIMA to the LAD. 4. Cardiomegaly with small bilateral pleural effusions and evidence of interstitial pulmonary edema in the lungs; imaging findings concerning for congestive heart failure. 5. Morphologic changes in the liver suggestive of underlying cirrhosis. 6. Cholelithiasis without evidence of acute cholecystitis at this time. 7. Severe atrophy of the kidneys bilaterally with multiple small lesions, likely to represent innumerable cysts. 8. Colonic diverticulosis without evidence of acute diverticulitis at this time. 9. Additional incidental findings, as above. Electronically Signed: By: Vinnie Langton M.D. On: 05/05/2020 14:17   CT ANGIO ABDOMEN PELVIS  W &/OR WO CONTRAST  Addendum Date: 05/07/2020   ADDENDUM REPORT: 05/07/2020 12:52 CLINICAL DATA:  64 year old male with h/o CAD, ischemic cardiomyopathy and aortic stenosis being evaluated for a TAVR procedure. EXAM: Cardiac TAVR CT TECHNIQUE: The patient was scanned on a [employer]. A 120 kV retrospective scan was  triggered in the descending thoracic aorta at 111 HU's. Gantry rotation speed was 250 msecs and collimation was .6 mm. 12.5 mg of PO Carvedilol and no nitro were given. The 3D data set was reconstructed in 5% intervals of the R-R cycle. Systolic and diastolic phases were analyzed on a dedicated work station using MPR, MIP and VRT modes. The patient received 80 cc of contrast. FINDINGS: Aortic Valve: Severely calcified trileaflet aortic valve with severely restricted leaflet openings and no calcifications extending into the LVOT. Aortic vale calcium score is 2534 consistent with severe aortic stenosis. Aorta: There is mild ascending aortic aneurysm  with maximum diameter 42.5 mm. There is diffuse atherosclerotic plaque and calcifications without dissection. Sinotubular Junction: 32 x 32 mm Ascending Thoracic Aorta: 42.5 x 40.7 mm Aortic Arch: 32 x 32 mm Descending Thoracic Aorta: 29 x 28 mm Sinus of Valsalva Measurements: Non-coronary: 37 mm Right -coronary: 35 mm Left -coronary: 35 mm Coronary Artery Height above Annulus: Left Main: 15 mm Right Coronary: 21 mm Virtual Basal Annulus Measurements: Maximum/Minimum Diameter: 30.9 x 25.1 mm Mean Diameter: 26.8 mm Perimeter: 86.1 mm Area: 563 mm2 Optimum Fluoroscopic Angle for Delivery: RAO 2 CAU 0 IMPRESSION: 1. Severely calcified trileaflet aortic valve with severely restricted leaflet openings and no calcifications extending into the LVOT. Aortic vale calcium score is 2534 consistent with severe aortic stenosis. Aortic measurements suitable for delivery of a 29 mm Edwards-SAPIEN 3 valve. 2. Sufficient coronary to annulus distance. 3. There is mild ascending aortic aneurysm with maximum diameter 42.5 mm. There is diffuse atherosclerotic plaque and calcifications without dissection. 4. Optimum Fluoroscopic Angle for Delivery:  RAO 2 CAU 0 5. No thrombus in the left atrial appendage. Electronically Signed   By: Ena Dawley   On: 05/07/2020 12:52   Result Date:  05/07/2020 CLINICAL DATA:  64 year old male with history of severe aortic stenosis. Preprocedural study prior to potential transcatheter aortic valve replacement (TAVR) procedure. EXAM: CT ANGIOGRAPHY CHEST, ABDOMEN AND PELVIS TECHNIQUE: Multidetector CT imaging through the chest, abdomen and pelvis was performed using the standard protocol during bolus administration of intravenous contrast. Multiplanar reconstructed images and MIPs were obtained and reviewed to evaluate the vascular anatomy. CONTRAST:  87mL OMNIPAQUE IOHEXOL 350 MG/ML SOLN COMPARISON:  Chest CT 07/01/2019. FINDINGS: CTA CHEST FINDINGS Cardiovascular: Heart size is mildly enlarged. There is no significant pericardial fluid, thickening or pericardial calcification. There is aortic atherosclerosis, as well as atherosclerosis of the great vessels of the mediastinum and the coronary arteries, including calcified atherosclerotic plaque in the left main, left anterior descending, left circumflex and right coronary arteries. Status post median sternotomy for CABG including LIMA to the LAD. Severe thickening calcification of the aortic valve. Mediastinum/Lymph Nodes: No pathologically enlarged mediastinal or hilar lymph nodes. Esophagus is unremarkable in appearance. No axillary lymphadenopathy. Lungs/Pleura: Small bilateral pleural effusions lying dependently. Widespread areas of ground-glass attenuation and interlobular septal thickening noted throughout the lungs bilaterally, suggesting a background of very mild interstitial pulmonary edema. No acute consolidative airspace disease. Musculoskeletal/Soft Tissues: Median sternotomy wires. Old healed fracture of the lateral aspect of the right sixth rib incidentally noted. There are no aggressive appearing lytic or blastic lesions noted in the visualized portions of the skeleton. CTA ABDOMEN AND PELVIS FINDINGS Hepatobiliary: Liver has a shrunken appearance and nodular contour, indicative of underlying  cirrhosis. Numerous tiny calcified granulomas are scattered throughout the hepatic parenchyma. No suspicious appearing hepatic lesions. No intra or extrahepatic biliary ductal dilatation. Tiny calcified gallstone lying dependently in the gallbladder. No findings to suggest an acute cholecystitis at this time. Pancreas: No pancreatic mass. No pancreatic ductal dilatation. No pancreatic or peripancreatic fluid collections or inflammatory changes. Spleen: Small calcified granulomas in the spleen. Adrenals/Urinary Tract: Atrophy of the kidneys bilaterally, where there are innumerable low-attenuation lesions, most of which are subcentimeter in size and too small to definitively characterize, but favored to represent cysts. The largest of these are compatible with simple cysts, measuring up to 3.4 cm in the lower pole of the left kidney. Multiple nonobstructive calculi are noted within the collecting systems of both kidneys measuring up to 1.3 cm in diameter  in the lower pole collecting system of the left kidney. No hydroureteronephrosis. Urinary bladder is unremarkable in appearance. Bilateral adrenal glands are normal in appearance. Stomach/Bowel: Normal appearance of the stomach. No pathologic dilatation of small bowel or colon. Several colonic diverticulae are noted, particularly in the descending colon and sigmoid colon, without surrounding inflammatory changes to suggest an acute diverticulitis at this time. Normal appendix. Vascular/Lymphatic: Aortic atherosclerosis, without evidence of aneurysm or dissection in the abdominal or pelvic vasculature. Vascular findings and measurements pertinent to potential TAVR procedure, as detailed below. No lymphadenopathy noted in the abdomen or pelvis. Reproductive: Prostate gland and seminal vesicles are unremarkable in appearance. Other: No significant volume of ascites.  No pneumoperitoneum. Musculoskeletal: Compression fracture of superior endplate of L2 with 48% loss of  anterior vertebral body height. There are no aggressive appearing lytic or blastic lesions noted in the visualized portions of the skeleton. VASCULAR MEASUREMENTS PERTINENT TO TAVR: AORTA: Minimal Aortic Diameter-11 x 6 mm Severity of Aortic Calcification-severe RIGHT PELVIS: Right Common Iliac Artery - Minimal Diameter-4.0 x 3.4 mm Tortuosity-mild Calcification-severe Right External Iliac Artery - Minimal Diameter-2.9 x 2.9 mm Tortuosity - mild Calcification - severe Right Common Femoral Artery - Minimal Diameter-4.2 x 3.3 mm Tortuosity - mild Calcification - severe LEFT PELVIS: Left Common Iliac Artery - Minimal Diameter-2.4 x 1.5 mm Tortuosity - mild Calcification - severe Left External Iliac Artery - Minimal Diameter - Patent lumen not confidently identified Tortuosity - mild Calcification - severe Left Common Femoral Artery - Minimal Diameter-4.0 x 3.5 mm Tortuosity - mild Calcification - severe Review of the MIP images confirms the above findings. IMPRESSION: 1. Vascular findings and measurements pertinent to potential TAVR procedure, as detailed above. 2. Severe thickening calcification of the aortic valve, compatible with reported clinical history of severe aortic stenosis. 3. Aortic atherosclerosis, in addition to left main and 3 vessel coronary artery disease. Status post median sternotomy for CABG including LIMA to the LAD. 4. Cardiomegaly with small bilateral pleural effusions and evidence of interstitial pulmonary edema in the lungs; imaging findings concerning for congestive heart failure. 5. Morphologic changes in the liver suggestive of underlying cirrhosis. 6. Cholelithiasis without evidence of acute cholecystitis at this time. 7. Severe atrophy of the kidneys bilaterally with multiple small lesions, likely to represent innumerable cysts. 8. Colonic diverticulosis without evidence of acute diverticulitis at this time. 9. Additional incidental findings, as above. Electronically Signed: By: Vinnie Langton M.D. On: 05/05/2020 14:17    Assessment/Plan Steal syndrome left hand appears present with pain and numbness beyond the access.  Already had a radial artery harvest on that side.  We will try to do an angiogram to see if the fistula might be salvageable with distal intervention on the brachial or ulnar artery.  If that is not possible, the fistula will need to be ligated. CKD stage IV/V.  Had a fistula placed for this reason.  Not yet on dialysis PAD.  Multiple previous interventions.  Has significant disease.    Leotis Pain, MD  06/03/2020 8:29 AM    This note was created with Dragon medical transcription system.  Any errors from dictation are purely unintentional

## 2020-06-03 NOTE — H&P (View-Only) (Signed)
MRN : [id number]  [name] [name] is a 64 y.o. ([birth year]) male who presents with chief complaint of No chief complaint on file. [name]  History of Present Illness: Patient returns today in follow up of his left arm pain after fistula placement a week ago.  He has been having pain in his hand and fingers as well as numbness since shortly after the procedure.  No open wounds although he said he did have some drainage from the left index finger.  He had already had a radial artery harvest on that side and his perfusion appears to be diminished after fistula creation.  Current Outpatient Medications Medication Sig Dispense Refill . acetaminophen (TYLENOL) 500 MG tablet Take 500 mg by mouth every 6 (six) hours as needed for moderate pain or headache.   . albuterol (PROAIR HFA) 108 (90 Base) MCG/ACT inhaler Inhale 2 puffs into the lungs every 6 (six) hours as needed for shortness of breath. 18 g 3 . alum & mag hydroxide-simeth (MAALOX/MYLANTA) 200-200-20 MG/5ML suspension Take 30 mLs by mouth every 6 (six) hours as needed for indigestion or heartburn.   . ARIPiprazole (ABILIFY) 2 MG tablet Take 1 tablet (2 mg total) by mouth at bedtime. 30 tablet 1 . B Complex-C (B-COMPLEX WITH VITAMIN C) tablet Take 1 tablet by mouth daily. 30 tablet 1 . busPIRone (BUSPAR) 10 MG tablet TAKE 2 TABLETS(20 MG) BY MOUTH TWICE DAILY (Patient taking differently: Take 20 mg by mouth 2 (two) times daily. ) 360 tablet 3 . calcitRIOL (ROCALTROL) 0.25 MCG capsule Take 0.25 mcg by mouth every Monday, Wednesday, and Friday.    . carvedilol (COREG) 3.125 MG tablet Take 1 tablet (3.125 mg total) by mouth 2 (two) times daily with a meal. 60 tablet 2 . clopidogrel (PLAVIX) 75 MG tablet Take 1 tablet (75 mg total) by mouth daily. 30 tablet 11 . CONTOUR TEST test strip USE TO check blood sugar ONCE DAILY AS DIRECTED 100 strip 9 . Dextran 70-Hypromellose (ARTIFICIAL TEARS) 0.1-0.3 % SOLN Place 1 drop into both eyes 4 (four) times daily  as needed (dry eyes).   . feeding supplement (ENSURE ENLIVE / ENSURE PLUS) LIQD Take 237 mLs by mouth daily. 237 mL 12 . finasteride (PROSCAR) 5 MG tablet Take 5 mg by mouth daily.   . insulin aspart (NOVOLOG) 100 UNIT/ML injection Inject 0-9 Units into the skin 3 (three) times daily with meals. 10 mL 11 . insulin regular (NOVOLIN R) 100 units/mL injection Inject 0.1-0.25 mLs (10-25 Units total) into the skin daily as needed (high blood sugar over 235). 10 mL 11 . Insulin Syringe-Needle U-100 30G X 15/64" 1 ML MISC Use to inject insulin as directed 200 each 11 . isosorbide mononitrate (IMDUR) 30 MG 24 hr tablet Take 1 tablet (30 mg total) by mouth daily. 30 tablet 0 . nitroGLYCERIN (NITROSTAT) 0.4 MG SL tablet DISSOLVE 1 TABLET UNDER THE TONGUE EVERY 5 MINUTES AS NEEDED FOR CHEST PAIN. DO NOT EXCEED A TOTAL OF 3 DOSES IN 15 MINUTES. (Patient taking differently: Place 0.4 mg under the tongue every 5 (five) minutes as needed for chest pain. ) 25 tablet 0 . rosuvastatin (CRESTOR) 10 MG tablet Take 1 tablet (10 mg total) by mouth daily. 90 tablet 3 . sertraline (ZOLOFT) 100 MG tablet Take 1 tablet (100 mg total) by mouth daily. 90 tablet 3 . tamsulosin (FLOMAX) 0.4 MG CAPS capsule Take 1 capsule by mouth daily (Patient taking differently: Take 0.4 mg by mouth daily. ) 90 capsule  3 . torsemide (DEMADEX) 20 MG tablet Take 3 tablets (60 mg total) by mouth 2 (two) times daily. 360 tablet 1  No current facility-administered medications for this visit.   Past Medical History: Diagnosis Date . 3-vessel CAD   8/30 LHC with radial graft to RCA occluded at the ostium with critical stenosis of the ostial native RCA. . Adenomatous colon polyp  . Allergy  . Anemia  . Anxiety  . Arthritis   RA . Asthma  . Bell's palsy 2007  neuropathy . CAD (coronary artery disease)   03/21/2020 LHC with patent LIMA to LAD and SVG to OM 3.  Radial graft to RCA occluded at the ostium with critical stenosis of the ostial  and native RCA. . Carotid artery occlusion   Bilateral carotid stenosis s/p left-sided CEA. . Cataract   Dr. Dawna Part . CKD (chronic kidney disease), stage IV ([place])   03/2020 vascular surgery access planned . Depression  . Diabetes mellitus  . Diverticulosis  . Duodenitis  . DVT (deep venous thrombosis) ([facility])   in leg . Dyspnea   with edema . ESRD (end stage renal disease) ([place])  . Gastropathy  . GERD (gastroesophageal reflux disease)  . Heart failure with preserved ejection fraction ([place])   8/30 RHC with moderately to severely elevated filling pressures and pulmonary wedge pressure 31 mmHg, moderate pulmonary hypertension . Heart murmur  . Helicobacter pylori gastritis  . Hx of CABG   2012 . Hyperlipidemia  . Hypertension  . Mitral regurgitation and aortic stenosis   RHC 8/30 and echo 12/2019. [name] Myocardial infarction ([place]) 2021 . Neuropathy  . Obesity  . Peripheral vascular disease ([place])   S/p extensive revascularization by vascular surgery 05/2019 . Post splenectomy syndrome  . Psoriasis  . Severe aortic stenosis   03/21/20 RHC with peak gradient 27.4 mmHg and valve area 0.87. [name] Sleep apnea   uses cpap . Stroke ([place])  . Tobacco use disorder   recently quit . Tubular adenoma 08/2013  Dr. Hilarie Fredrickson . Weak urinary stream    Past Surgical History: Procedure Laterality Date . AV FISTULA PLACEMENT N/A 05/24/2020  Procedure: ARTERIOVENOUS (AV) FISTULA CREATION (Brachio-cephalic);  Surgeon: Algernon Huxley, MD;  Location: [facility] ORS;  Service: Vascular;  Laterality: N/A; . CARDIAC CATHETERIZATION  08/2010  LAD: 80% ISR, RCA: 80% ostial, OM 80-90% . CAROTID ENDARTERECTOMY  08/2010  left/ Dr. Kellie Simmering . CATARACT EXTRACTION W/PHACO Left 07/09/2017  Procedure: CATARACT EXTRACTION PHACO AND INTRAOCULAR LENS PLACEMENT (IOC);  Surgeon: Birder Robson, MD;  Location: [facility] OR[facility];  Service: Ophthalmology;  Laterality: Left;  [place] 00:23 AP% 14.2 CDE 3.26 Fluid pack lot # [id number] . CATARACT  EXTRACTION W/PHACO Right 09/10/2017  Procedure: CATARACT EXTRACTION PHACO AND INTRAOCULAR LENS PLACEMENT (IOC);  Surgeon: Birder Robson, MD;  Location: [facility];  Service: Ophthalmology;  Laterality: Right;  [place] 00:26.4 AP% 17.3 CDE 4.59 Fluid Pack Lot # [id number] . COLONOSCOPY   . CORONARY ANGIOPLASTY    LAD: before CABG . CORONARY ARTERY BYPASS GRAFT  09/06/2010  At [facility]: LIMA to LAD, left radial to RCA, sequential SVG to OM3 and 4 . CORONARY STENT INTERVENTION N/A 03/23/2020  Procedure: CORONARY STENT INTERVENTION;  Surgeon: Wellington Hampshire, MD;  Location: [place] [facility];  Service: Cardiovascular;  Laterality: N/A;  RCA . ESOPHAGOGASTRODUODENOSCOPY (EGD) WITH PROPOFOL N/A 04/11/2020  Procedure: ESOPHAGOGASTRODUODENOSCOPY (EGD) WITH PROPOFOL;  Surgeon: Lucilla Lame, MD;  [facility];  Service: Endoscopy;  Laterality: N/A; . heart stents  Jan 2011  leg  stents 06/2009 and 03/2010 . LOWER EXTREMITY ANGIOGRAPHY Left 06/09/2019  Procedure: LOWER EXTREMITY ANGIOGRAPHY;  Surgeon: Katha Cabal, MD;  Location: [place] [facility] LAB;  Service: Cardiovascular;  Laterality: Left; . PTA of illiac and SFA  multiple  Dr. Ronalee Belts, s/p revision 11/2012 . RIGHT AND LEFT HEART CATH N/A 03/21/2020  Procedure: RIGHT AND LEFT HEART CATH possible percutaneous intervention;  Surgeon: Wellington Hampshire, MD;  Location: [place] [facility];  Service: Cardiovascular;  Laterality: N/A; . RIGHT/LEFT HEART CATH AND CORONARY/GRAFT ANGIOGRAPHY N/A 02/15/2020  Procedure: RIGHT/LEFT HEART CATH AND CORONARY/GRAFT ANGIOGRAPHY;  Surgeon: Wellington Hampshire, MD;  Location: [place] [facility] LAB;  Service: Cardiovascular;  Laterality: N/A; . SPLENECTOMY   . VASCULAR SURGERY    LEG STENTS    Social History  Tobacco Use . Smoking status: Former Smoker   Packs/day: 0.25   Years: 40.00   Pack years: 10.00   Types: Cigarettes . Smokeless tobacco: Never Used . Tobacco comment: 1 cigarette a day Vaping  Use . Vaping Use: Never used Substance Use Topics . Alcohol use: No . Drug use: No      Family History Problem Relation Age of Onset . Lung cancer Father 33 . Arthritis Father  . Breast cancer Sister 19 . Alcoholism Sister  . Dementia Mother  . Alzheimer's disease Mother  . Arthritis Mother  . Alcoholism Brother  . Epilepsy Sister  . Diabetes Paternal Grandfather  . Colon polyps Paternal Grandfather 68 . Alcoholism Sister  . Alcoholism Sister  . Alcoholism Brother     Allergies Allergen Reactions . Contrast Media [Iodinated Diagnostic Agents] Rash and Other (See Comments)   Got very hot and red  . Glipizide Other (See Comments)   ANTIDIABETICS. Burning . Hydroxychloroquine Other (See Comments)   Stomach upset . Metrizamide Other (See Comments)   Got very hot and red . Penicillins Hives and Swelling   Has patient had a PCN reaction causing immediate rash, facial/tongue/throat swelling, SOB or lightheadedness with hypotension: yes Has patient had a PCN reaction causing severe rash involving mucus membranes or skin necrosis: no  Has patient had a PCN reaction that required hospitalization: yes Has patient had a PCN reaction occurring within the last 10 years: no If all of the above answers are "NO", then may proceed with Cephalosporin use.     REVIEW OF SYSTEMS (Negative unless checked)  Constitutional: Weight loss  Fever  Chills Cardiac: X Chest pain   Chest pressure   Palpitations   Shortness of breath when laying flat   X Shortness of breath at rest  X  Shortness of breath with exertion. Vascular:  X Pain in legs with walking   Pain in legs at rest   Pain in legs when laying flat   X Claudication   Pain in feet when walking  Pain in feet at rest  Pain in feet when laying flat   History of DVT   Phlebitis   Swelling in legs   Varicose veins   Non-healing ulcers Pulmonary:   Uses home oxygen   Productive cough   Hemoptysis   [place]  COPD   Asthma Neurologic:   Dizziness  Blackouts   Seizures   History of stroke   History of TIA  Aphasia   Temporary blindness   Dysphagia  X  Weakness or numbness in arms   Weakness or numbness in legs Musculoskeletal:  Arthritis   Joint swelling   Joint pain   Low back pain Hematologic:  Easy bruising  Easy bleeding   Hypercoagulable state   Anemic   Gastrointestinal:  Blood in stool   Vomiting blood  Gastroesophageal reflux/heartburn   Abdominal pain Genitourinary:  X Chronic kidney disease   Difficult urination  Frequent urination  Burning with urination   Hematuria Skin:  Rashes   Ulcers   Wounds Psychological:  History of anxiety    History of major depression.  Physical Examination  There were no vitals taken for this visit. Gen:  WD/WN, NAD Head: [place]/AT, No temporalis wasting. Ear/Nose/Throat: Hearing grossly intact, nares w/o erythema or drainage Eyes: Conjunctiva clear. Sclera non-icteric Neck: Supple.  Trachea midline Pulmonary:  Good air movement, no use of accessory muscles.  Cardiac: RRR, no JVD, systolic murmur Vascular:  Vessel Right Left Radial Palpable Not Palpable                Musculoskeletal: M/S 5/5 throughout.  No deformity or atrophy. 2+ LE edema. Neurologic: Sensation grossly intact in extremities.  Symmetrical.  Speech is fluent.  Psychiatric: Judgment intact, Mood & affect appropriate for pt's clinical situation. Dermatologic: No rashes or ulcers noted.  No cellulitis or open wounds.      Labs Recent Results (from the past 2160 hour(s)) Troponin I (High Sensitivity)     Status: None  Collection Time: 03/11/20 11:34 AM Result Value Ref Range  Troponin I (High Sensitivity) 15 <18 ng/L   Comment: (NOTE) Elevated high sensitivity troponin I (hsTnI) values and significant  changes across serial measurements may suggest ACS but many other  chronic and acute conditions are known to elevate hsTnI results.  Refer to the "Links" section for chest pain algorithms and additional   guidance. Performed at [facility], [place], [place] [address]  SARS CORONAVIRUS 2 (TAT 6-24 HRS) Nasopharyngeal Nasopharyngeal Swab     Status: None  Collection Time: 03/14/20  3:40 PM  Specimen: Nasopharyngeal Swab Result Value Ref Range  SARS Coronavirus 2 NEGATIVE NEGATIVE   Comment: (NOTE) SARS-CoV-2 target nucleic acids are NOT DETECTED.  The SARS-CoV-2 RNA is generally detectable in upper and lower respiratory specimens during the acute phase of infection. Negative results do not preclude SARS-CoV-2 infection, do not rule out co-infections with other pathogens, and should not be used as the sole basis for treatment or other patient management decisions. Negative results must be combined with clinical observations, patient history, and epidemiological information. The expected result is Negative.  Fact Sheet for Patients: SugarRoll.be  Fact Sheet for Healthcare Providers: https://www.woods-mathews.com/  This test is not yet approved or cleared by the [place] FDA and  has been authorized for detection and/or diagnosis of SARS-CoV-2 by FDA under an Emergency Use Authorization (EUA). This EUA will remain  in effect (meaning this test can be used) for the duration of the COVID-19 declaration under Se ction 564(b)(1) of the Act, 21 U.S.C. section 360bbb-3(b)(1), unless the authorization is terminated or revoked sooner.  Performed at [facility], [place] [address], [place], [place] [address]  CBC     Status: Abnormal  Collection Time: 03/15/20  8:34 AM Result Value Ref Range  WBC 6.2 4.0 - 10.5 K/uL  RBC 2.77 (L) 4.22 - 5.81 MIL/uL  Hemoglobin 8.8 (L) 13.0 - 17.0 g/dL  HCT 28.0 (L) 39 - 52 %  MCV 101.1 (H) 80.0 - 100.0 fL  MCH 31.8 26.0 - 34.0 pg  MCHC 31.4 30.0 - 36.0 g/dL  RDW 14.0 11.5 - 15.5 %  Platelets 131 (  L) 150 - 400 K/uL  nRBC 0.0 0.0 - 0.2 %   Comment: Performed at [facility], [address] [place], [place] [address] Basic metabolic panel     Status: Abnormal  Collection Time: 03/15/20  8:34 AM Result Value Ref Range  Sodium 138 135 - 145 mmol/L  Potassium 4.6 3.5 - 5.1 mmol/L  Chloride 100 98 - 111 mmol/L  CO2 26 22 - 32 mmol/L  Glucose, Bld 165 (H) 70 - 99 mg/dL   Comment: Glucose reference range applies only to samples taken after fasting for at least 8 hours.  BUN 52 (H) 8 - 23 mg/dL  Creatinine, Ser 4.32 (H) 0.61 - 1.24 mg/dL  Calcium 8.7 (L) 8.9 - 10.3 mg/dL  GFR calc non Af Amer 13 (L) >60 mL/min  GFR calc Af Amer 15 (L) >60 mL/min  Anion gap 12 5 - 15   Comment: Performed at [facility], [address] [place], [place] [address] CBC with Differential/Platelet     Status: Abnormal  Collection Time: 03/20/20  6:20 AM Result Value Ref Range  WBC 6.6 4.0 - 10.5 K/uL  RBC 2.49 (L) 4.22 - 5.81 MIL/uL  Hemoglobin 8.0 (L) 13.0 - 17.0 g/dL  HCT 24.3 (L) 39 - 52 %  MCV 97.6 80.0 - 100.0 fL  MCH 32.1 26.0 - 34.0 pg  MCHC 32.9 30.0 - 36.0 g/dL  RDW 14.3 11.5 - 15.5 %  Platelets 125 (L) 150 - 400 K/uL  nRBC 0.0 0.0 - 0.2 %  Neutrophils Relative % 65 %  Neutro Abs 4.3 1.7 - 7.7 K/uL  Lymphocytes Relative 22 %  Lymphs Abs 1.5 0.7 - 4.0 K/uL  Monocytes Relative 9 %  Monocytes Absolute 0.6 0.1 - 1.0 K/uL  Eosinophils Relative 3 %  Eosinophils Absolute 0.2 0.0 - 0.5 K/uL  Basophils Relative 1 %  Basophils Absolute 0.0 0.0 - 0.1 K/uL  Immature Granulocytes 0 %  Abs Immature Granulocytes 0.02 0.00 - 0.07 K/uL   Comment: Performed at [facility], [address] [place], [place] [address] Basic metabolic panel     Status: Abnormal  Collection Time: 03/20/20  6:20 AM Result Value Ref Range  Sodium 138 135 - 145 mmol/L  Potassium 3.8 3.5 - 5.1 mmol/L  Chloride 105 98 - 111 mmol/L  CO2 20 (L) 22 - 32 mmol/L  Glucose, Bld 178 (H) 70 - 99 mg/dL   Comment: Glucose reference range applies only to samples taken after  fasting for at least 8 hours.  BUN 63 (H) 8 - 23 mg/dL  Creatinine, Ser 4.21 (H) 0.61 - 1.24 mg/dL  Calcium 7.8 (L) 8.9 - 10.3 mg/dL  GFR calc non Af Amer 14 (L) >60 mL/min  GFR calc Af Amer 16 (L) >60 mL/min  Anion gap 13 5 - 15   Comment: Performed at [facility], [place], [place] [address] Troponin I (High Sensitivity)     Status: Abnormal  Collection Time: 03/20/20  6:20 AM Result Value Ref Range  Troponin I (High Sensitivity) 19 (H) <18 ng/L   Comment: (NOTE) Elevated high sensitivity troponin I (hsTnI) values and significant  changes across serial measurements may suggest ACS but many other  chronic and acute conditions are known to elevate hsTnI results.  Refer to the "Links" section for chest pain algorithms and additional  guidance. Performed at [facility], [address], [place], [place] [address]  Protime-INR     Status: None  Collection Time: 03/20/20  6:20 AM Result Value Ref Range  Prothrombin Time 14.0 11.4 - 15.2 seconds  INR 1.1 0.8 - 1.2   Comment: (NOTE) INR goal varies based on device and disease states. Performed at [facility], [address] [place], [place] [address]  APTT     Status: None  Collection Time: 03/20/20  6:20 AM Result Value Ref Range  aPTT 34 24 - 36 seconds   Comment: Performed at [facility], [address] [place], [place] [address] Brain natriuretic peptide     Status: Abnormal  Collection Time: 03/20/20  6:20 AM Result Value Ref Range  B Natriuretic Peptide 503.2 (H) 0.0 - 100.0 pg/mL   Comment: Performed at [facility], [address], [place], [place] [address] Magnesium     Status: None  Collection Time: 03/20/20  6:20 AM Result Value Ref Range  Magnesium 2.2 1.7 - 2.4 mg/dL   Comment: Performed at [facility], [address] [place], [place] [address] SARS Coronavirus 2 by RT PCR (hospital order, performed in [facility] hospital lab)  Nasopharyngeal Nasopharyngeal Swab     Status: None  Collection Time: 03/20/20  7:28 AM  Specimen: Nasopharyngeal Swab Result Value Ref Range  SARS Coronavirus 2 NEGATIVE NEGATIVE   Comment: (NOTE) SARS-CoV-2 target nucleic acids are NOT DETECTED.  The SARS-CoV-2 RNA is generally detectable in upper and lower respiratory specimens during the acute phase of infection. The lowest concentration of SARS-CoV-2 viral copies this assay can detect is 250 copies / mL. A negative result does not preclude SARS-CoV-2 infection and should not be used as the sole basis for treatment or other patient management decisions.  A negative result may occur with improper specimen collection / handling, submission of specimen other than nasopharyngeal swab, presence of viral mutation(s) within the areas targeted by this assay, and inadequate number of viral copies (<250 copies / mL). A negative result must be combined with clinical observations, patient history, and epidemiological information.  Fact Sheet for Patients:   StrictlyIdeas.no  Fact Sheet for Healthcare Providers: BankingDealers.co.za  This test is not yet approved or  cleared by the [place] FDA and has been authorized for detection and/or diagnosis of SARS-CoV-2 by FDA under an Emergency Use Authorization (EUA).  This EUA will remain in effect (meaning this test can be used) for the duration of the COVID-19 declaration under Section 564(b)(1) of the Act, 21 U.S.C. section 360bbb-3(b)(1), unless the authorization is terminated or revoked sooner.  Performed at [facility], [place], [place] [address]  Troponin I (High Sensitivity)     Status: Abnormal  Collection Time: 03/20/20  8:27 AM Result Value Ref Range  Troponin I (High Sensitivity) 51 (H) <18 ng/L   Comment: READ BACK AND VERIFIED WITH LORRIE LEMONS AT 2094 03/20/20 DAS (NOTE) Elevated high sensitivity  troponin I (hsTnI) values and significant  changes across serial measurements may suggest ACS but many other  chronic and acute conditions are known to elevate hsTnI results.  Refer to the "Links" section for chest pain algorithms and additional  guidance. Performed at [facility], [address] [place], [place] [address]  Creatinine, serum     Status: Abnormal  Collection Time: 03/20/20  9:19 AM Result Value Ref Range  Creatinine, Ser 4.58 (H) 0.61 - 1.24 mg/dL  GFR calc non Af Amer 12 (L) >60 mL/min  GFR calc Af Amer 14 (L) >60 mL/min   Comment: Performed at  [facility], [address] [place], [place] [address] Glucose, capillary     Status: Abnormal  Collection Time: 03/20/20 12:48 PM Result Value Ref Range  Glucose-Capillary 147 (H) 70 - 99 mg/dL   Comment: Glucose reference range applies only to samples taken after fasting for at least 8 hours. Troponin I (High Sensitivity)     Status: Abnormal  Collection Time: 03/20/20 12:55 PM Result Value Ref Range  Troponin I (High Sensitivity) 86 (H) <18 ng/L   Comment: READ BACK AND VERIFIED WITH SOUMOOUN TOURE@1343  ON 03/20/20 BY [facility] (NOTE) Elevated high sensitivity troponin I (hsTnI) values and significant  changes across serial measurements may suggest ACS but many other  chronic and acute conditions are known to elevate hsTnI results.  Refer to the "Links" section for chest pain algorithms and additional  guidance. Performed at [facility], [address] [place], [place] [address]  Vitamin B12     Status: None  Collection Time: 03/20/20  1:58 PM Result Value Ref Range  Vitamin B-12 281 180 - 914 pg/mL   Comment: (NOTE) This assay is not validated for testing neonatal or myeloproliferative syndrome specimens for Vitamin B12 levels. Performed at [facility], [place] [address] [place], [place] [address]  Folate     Status: None  Collection Time: 03/20/20  1:58 PM Result Value Ref  Range  Folate 10.1 >5.9 ng/mL   Comment: Performed at [facility], [address] [place], [place] [address] Iron and TIBC     Status: None  Collection Time: 03/20/20  1:58 PM Result Value Ref Range  Iron 138 45 - 182 ug/dL  TIBC 382 250 - 450 ug/dL  Saturation Ratios 36 17.9 - 39.5 %  UIBC 244 ug/dL   Comment: Performed at [facility], [address] [place], [place] [address] Ferritin     Status: None  Collection Time: 03/20/20  1:58 PM Result Value Ref Range  Ferritin 32 24 - 336 ng/mL   Comment: Performed at [facility], [address] [place], [place] [address] Reticulocytes     Status: Abnormal  Collection Time: 03/20/20  1:58 PM Result Value Ref Range  Retic Ct Pct 3.6 (H) 0.4 - 3.1 %  RBC. 2.48 (L) 4.22 - 5.81 MIL/uL  Retic Count, Absolute 89.3 19.0 - 186.0 K/uL  Immature Retic Fract 26.3 (H) 2.3 - 15.9 %   Comment: Performed at [facility], [address] [place], [place] [address] Glucose, capillary     Status: Abnormal  Collection Time: 03/20/20  4:41 PM Result Value Ref Range  Glucose-Capillary 169 (H) 70 - 99 mg/dL   Comment: Glucose reference range applies only to samples taken after fasting for at least 8 hours. Heparin level (unfractionated)     Status: Abnormal  Collection Time: 03/20/20  5:52 PM Result Value Ref Range  Heparin Unfractionated 0.17 (L) 0.30 - 0.70 IU/mL   Comment: (NOTE) If heparin results are below expected values, and patient dosage has  been confirmed, suggest follow up testing of antithrombin III levels. Performed at [facility], [address] [place], [place] [address]  Glucose, capillary     Status: Abnormal  Collection Time: 03/20/20  8:44 PM Result Value Ref Range  Glucose-Capillary 228 (H) 70 - 99 mg/dL   Comment: Glucose reference range applies only to samples taken after fasting for at least 8 hours. CBC     Status: Abnormal  Collection Time: 03/21/20  3:41  AM  Result Value Ref Range  WBC 5.9 4.0 - 10.5 K/uL  RBC 2.42 (L) 4.22 - 5.81 MIL/uL  Hemoglobin 7.8 (L) 13.0 - 17.0 g/dL  HCT 23.9 (L) 39 - 52 %  MCV 98.8 80.0 - 100.0 fL  MCH 32.2 26.0 - 34.0 pg  MCHC 32.6 30.0 - 36.0 g/dL  RDW 14.1 11.5 - 15.5 %  Platelets 125 (L) 150 - 400 K/uL  nRBC 0.0 0.0 - 0.2 %   Comment: Performed at [facility], [place], [place] [address] Heparin level (unfractionated)     Status: None  Collection Time: 03/21/20  3:41 AM Result Value Ref Range  Heparin Unfractionated 0.55 0.30 - 0.70 IU/mL   Comment: (NOTE) If heparin results are below expected values, and patient dosage has  been confirmed, suggest follow up testing of antithrombin III levels. Performed at [facility], [address] [place] [address], [place] [address]  Glucose, capillary     Status: Abnormal  Collection Time: 03/21/20  7:28 AM Result Value Ref Range  Glucose-Capillary 187 (H) 70 - 99 mg/dL   Comment: Glucose reference range applies only to samples taken after fasting for at least 8 hours. Glucose, capillary     Status: Abnormal  Collection Time: 03/21/20 10:11 AM Result Value Ref Range  Glucose-Capillary 194 (H) 70 - 99 mg/dL   Comment: Glucose reference range applies only to samples taken after fasting for at least 8 hours. Glucose, capillary     Status: Abnormal  Collection Time: 03/21/20 12:27 PM Result Value Ref Range  Glucose-Capillary 200 (H) 70 - 99 mg/dL   Comment: Glucose reference range applies only to samples taken after fasting for at least 8 hours. Glucose, capillary     Status: Abnormal  Collection Time: 03/21/20  5:29 PM Result Value Ref Range  Glucose-Capillary 274 (H) 70 - 99 mg/dL   Comment: Glucose reference range applies only to samples taken after fasting for at least 8 hours. Glucose, capillary     Status: Abnormal  Collection Time: 03/21/20  9:47 PM Result Value Ref Range  Glucose-Capillary 247 (H) 70 - 99  mg/dL   Comment: Glucose reference range applies only to samples taken after fasting for at least 8 hours. Glucose, capillary     Status: Abnormal  Collection Time: 03/22/20  1:56 AM Result Value Ref Range  Glucose-Capillary 186 (H) 70 - 99 mg/dL   Comment: Glucose reference range applies only to samples taken after fasting for at least 8 hours. Heparin level (unfractionated)     Status: None  Collection Time: 03/22/20  6:54 AM Result Value Ref Range  Heparin Unfractionated 0.59 0.30 - 0.70 IU/mL   Comment: (NOTE) If heparin results are below expected values, and patient dosage has  been confirmed, suggest follow up testing of antithrombin III levels. Performed at [facility], [place] [address] [place], [place] [address]  Basic metabolic panel     Status: Abnormal  Collection Time: 03/22/20  6:54 AM Result Value Ref Range  Sodium 134 (L) 135 - 145 mmol/L  Potassium 3.9 3.5 - 5.1 mmol/L  Chloride 99 98 - 111 mmol/L  CO2 23 22 - 32 mmol/L  Glucose, Bld 210 (H) 70 - 99 mg/dL   Comment: Glucose reference range applies only to samples taken after fasting for at least 8 hours.  BUN 82 (H) 8 - 23 mg/dL  Creatinine, Ser 4.47 (H) 0.61 - 1.24 mg/dL  Calcium 8.5 (L) 8.9 - 10.3 mg/dL  GFR  calc non Af Amer 13 (L) >60 mL/min  GFR calc Af Amer 15 (L) >60 mL/min  Anion gap 12 5 - 15   Comment: Performed at [facility] [address], [place], [place] [address] CBC with Differential/Platelet     Status: Abnormal  Collection Time: 03/22/20  6:54 AM Result Value Ref Range  WBC 8.7 4.0 - 10.5 K/uL  RBC 2.22 (L) 4.22 - 5.81 MIL/uL  Hemoglobin 7.1 (L) 13.0 - 17.0 g/dL  HCT 22.5 (L) 39 - 52 %  MCV 101.4 (H) 80.0 - 100.0 fL  MCH 32.0 26.0 - 34.0 pg  MCHC 31.6 30.0 - 36.0 g/dL  RDW 14.3 11.5 - 15.5 %  Platelets 135 (L) 150 - 400 K/uL  nRBC 0.0 0.0 - 0.2 %  Neutrophils Relative % 70 %  Neutro Abs 6.2 1.7 - 7.7 K/uL  Lymphocytes Relative 19 %  Lymphs Abs 1.6 0.7 - 4.0  K/uL  Monocytes Relative 8 %  Monocytes Absolute 0.7 0.1 - 1.0 K/uL  Eosinophils Relative 2 %  Eosinophils Absolute 0.1 0.0 - 0.5 K/uL  Basophils Relative 0 %  Basophils Absolute 0.0 0.0 - 0.1 K/uL  Immature Granulocytes 1 %  Abs Immature Granulocytes 0.05 0.00 - 0.07 K/uL   Comment: Performed at [facility] [address] [place], [place] [address] Glucose, capillary     Status: Abnormal  Collection Time: 03/22/20  6:55 AM Result Value Ref Range  Glucose-Capillary 217 (H) 70 - 99 mg/dL   Comment: Glucose reference range applies only to samples taken after fasting for at least 8 hours. Type and screen [place]     Status: None  Collection Time: 03/22/20  7:00 AM Result Value Ref Range  ABO/RH(D) AB POS   Antibody Screen NEG   Sample Expiration 03/25/2020,2359   Unit Number [id number]   Blood Component Type RED CELLS,LR   Unit division 00   Status of Unit ISSUED,FINAL   Transfusion Status OK TO TRANSFUSE   Crossmatch Result Compatible   Unit Number [id number]   Blood Component Type RED CELLS,LR   Unit division 00   Status of Unit ISSUED,FINAL   Transfusion Status OK TO TRANSFUSE   Crossmatch Result     Compatible Performed at [facility], [place] [address], [place],  [address]  BPAM RBC     Status: None  Collection Time: 03/22/20  7:00 AM Result Value Ref Range  ISSUE DATE / TIME 867619509326   Blood Product Unit Number [id number]   PRODUCT CODE [id number]   Unit Type and Rh 8400   Blood Product Expiration Date 734193790240   ISSUE DATE / TIME 973532992426   Blood Product Unit Number [id number]   PRODUCT CODE [id number]   Unit Type and Rh 8400   Blood Product Expiration Date 408144818563  Prepare RBC (crossmatch)     Status: None  Collection Time: 03/22/20  9:48 AM Result Value Ref Range  Order Confirmation     ORDER PROCESSED BY BLOOD BANK Performed at [facility], [place] [address] [place],   [address]  Glucose, capillary     Status: Abnormal  Collection Time: 03/22/20 11:20 AM Result Value Ref Range  Glucose-Capillary 155 (H) 70 - 99 mg/dL   Comment: Glucose reference range applies only to samples taken after fasting for at least 8 hours. ECHOCARDIOGRAM COMPLETE     Status: None  Collection Time: 03/22/20 12:34 PM Result Value Ref Range  Weight 3,742.4  oz  Height 66 in  BP 145/84 mmHg  Single Plane A2C EF 41.7 %  Single Plane A4C EF 17.9 %  Calc EF 28.4 %  S' Lateral 6.20 cm  AR max vel 1.15 cm2  AV Area VTI 1.08 cm2  AV Mean grad 22.0 mmHg  AV Peak grad 34.9 mmHg  Ao pk vel 2.95 m/s  Area-P 1/2 2.82 cm2  Radius 0.30 cm  MV M vel 5.36 m/s  AV Area mean vel 1.19 cm2  MV Peak grad 114.9 mmHg Glucose, capillary     Status: Abnormal  Collection Time: 03/22/20  5:04 PM Result Value Ref Range  Glucose-Capillary 163 (H) 70 - 99 mg/dL   Comment: Glucose reference range applies only to samples taken after fasting for at least 8 hours. Hemoglobin and hematocrit, blood     Status: Abnormal  Collection Time: 03/22/20  6:15 PM Result Value Ref Range  Hemoglobin 8.5 (L) 13.0 - 17.0 g/dL  HCT 27.0 (L) 39 - 52 %   Comment: Performed at [facility] [address] [place], [place] [address] Glucose, capillary     Status: Abnormal  Collection Time: 03/22/20  9:44 PM Result Value Ref Range  Glucose-Capillary 152 (H) 70 - 99 mg/dL   Comment: Glucose reference range applies only to samples taken after fasting for at least 8 hours. Basic metabolic panel     Status: Abnormal  Collection Time: 03/23/20  5:24 AM Result Value Ref Range  Sodium 133 (L) 135 - 145 mmol/L  Potassium 4.8 3.5 - 5.1 mmol/L  Chloride 97 (L) 98 - 111 mmol/L  CO2 23 22 - 32 mmol/L  Glucose, Bld 218 (H) 70 - 99 mg/dL   Comment: Glucose reference range applies only to samples taken after fasting for at least 8 hours.  BUN 85 (H) 8 - 23 mg/dL  Creatinine, Ser 4.68 (H) 0.61 - 1.24 mg/dL  Calcium 8.6  (L) 8.9 - 10.3 mg/dL  GFR calc non Af Amer 12 (L) >60 mL/min  GFR calc Af Amer 14 (L) >60 mL/min  Anion gap 13 5 - 15   Comment: Performed at [facility] [address] [place], [place] [address] Heparin level (unfractionated)     Status: None  Collection Time: 03/23/20  5:24 AM Result Value Ref Range  Heparin Unfractionated 0.43 0.30 - 0.70 IU/mL   Comment: (NOTE) If heparin results are below expected values, and patient dosage has  been confirmed, suggest follow up testing of antithrombin III levels. Performed at [facility], [place] [address], [place], [place] [address]  CBC     Status: Abnormal  Collection Time: 03/23/20  5:24 AM Result Value Ref Range  WBC 8.3 4.0 - 10.5 K/uL  RBC 2.57 (L) 4.22 - 5.81 MIL/uL  Hemoglobin 7.9 (L) 13.0 - 17.0 g/dL  HCT 25.1 (L) 39 - 52 %  MCV 97.7 80.0 - 100.0 fL  MCH 30.7 26.0 - 34.0 pg  MCHC 31.5 30.0 - 36.0 g/dL  RDW 16.2 (H) 11.5 - 15.5 %  Platelets 128 (L) 150 - 400 K/uL  nRBC 0.0 0.0 - 0.2 %   Comment: Performed at [facility], [place] [address], [place], [place] [address] Glucose, capillary     Status: Abnormal  Collection Time: 03/23/20  6:05 AM Result Value Ref Range  Glucose-Capillary 211 (H) 70 - 99 mg/dL   Comment: Glucose reference range applies only to samples taken after fasting for at least 8  hours. Hemoglobin and hematocrit, blood     Status: Abnormal  Collection Time: 03/23/20  7:28 AM Result Value Ref Range  Hemoglobin 7.9 (L) 13.0 - 17.0 g/dL  HCT 25.1 (L) 39 - 52 %   Comment: Performed at [facility] [address] [place], [place] [address] Prepare RBC (crossmatch)     Status: None  Collection Time: 03/23/20  9:24 AM Result Value Ref Range  Order Confirmation     ORDER PROCESSED BY BLOOD BANK Performed at [facility], [place] [address] [place], [place] [address]  Glucose, capillary     Status: Abnormal  Collection Time: 03/23/20 11:22 AM Result Value Ref  Range  Glucose-Capillary 210 (H) 70 - 99 mg/dL   Comment: Glucose reference range applies only to samples taken after fasting for at least 8 hours. POCT Activated clotting time     Status: None  Collection Time: 03/23/20  2:12 PM Result Value Ref Range  Activated Clotting Time 290 seconds Glucose, capillary     Status: Abnormal  Collection Time: 03/23/20  8:34 PM Result Value Ref Range  Glucose-Capillary 301 (H) 70 - 99 mg/dL   Comment: Glucose reference range applies only to samples taken after fasting for at least 8 hours. Glucose, capillary     Status: Abnormal  Collection Time: 03/24/20 12:20 AM Result Value Ref Range  Glucose-Capillary 202 (H) 70 - 99 mg/dL   Comment: Glucose reference range applies only to samples taken after fasting for at least 8 hours. Ferritin     Status: None  Collection Time: 03/24/20  3:57 AM Result Value Ref Range  Ferritin 45 24 - 336 ng/mL   Comment: Performed at [facility], [place] [address], [place], [place] [address] CBC     Status: Abnormal  Collection Time: 03/24/20  3:57 AM Result Value Ref Range  WBC 10.9 (H) 4.0 - 10.5 K/uL  RBC 3.16 (L) 4.22 - 5.81 MIL/uL  Hemoglobin 9.5 (L) 13.0 - 17.0 g/dL  HCT 30.2 (L) 39 - 52 %  MCV 95.6 80.0 - 100.0 fL  MCH 30.1 26.0 - 34.0 pg  MCHC 31.5 30.0 - 36.0 g/dL  RDW 17.1 (H) 11.5 - 15.5 %  Platelets 159 150 - 400 K/uL  nRBC 0.0 0.0 - 0.2 %   Comment: Performed at [facility], [place] [address] [place], [place] [address] Comprehensive metabolic panel     Status: Abnormal  Collection Time: 03/24/20  3:57 AM Result Value Ref Range  Sodium 134 (L) 135 - 145 mmol/L  Potassium 4.5 3.5 - 5.1 mmol/L  Chloride 96 (L) 98 - 111 mmol/L  CO2 25 22 - 32 mmol/L  Glucose, Bld 196 (H) 70 - 99 mg/dL   Comment: Glucose reference range applies only to samples taken after fasting for at least 8 hours.  BUN 92 (H) 8 - 23 mg/dL  Creatinine, Ser 4.77 (H) 0.61 - 1.24 mg/dL  Calcium 9.1 8.9 - 10.3 mg/dL  Total  Protein 7.7 6.5 - 8.1 g/dL  Albumin 3.8 3.5 - 5.0 g/dL  AST 20 15 - 41 U/L  ALT 18 0 - 44 U/L  Alkaline Phosphatase 72 38 - 126 U/L  Total Bilirubin 0.5 0.3 - 1.2 mg/dL  GFR calc non Af Amer 12 (L) >60 mL/min  GFR calc Af Amer 14 (L) >60 mL/min  Anion gap 13 5 - 15   Comment: Performed at [facility] [address] [place], [place] [address]  Magnesium     Status: Abnormal  Collection Time: 03/24/20  3:57 AM Result Value Ref Range  Magnesium 2.7 (H) 1.7 - 2.4 mg/dL   Comment: Performed at [facility] [address], [place], [place] [address] Phosphorus     Status: Abnormal  Collection Time: 03/24/20  3:57 AM Result Value Ref Range  Phosphorus 5.0 (H) 2.5 - 4.6 mg/dL   Comment: Performed at [facility] [address] [place], [place] [address] Glucose, capillary     Status: Abnormal  Collection Time: 03/24/20  4:22 AM Result Value Ref Range  Glucose-Capillary 183 (H) 70 - 99 mg/dL   Comment: Glucose reference range applies only to samples taken after fasting for at least 8 hours. Glucose, capillary     Status: Abnormal  Collection Time: 03/24/20  8:05 AM Result Value Ref Range  Glucose-Capillary 173 (H) 70 - 99 mg/dL   Comment: Glucose reference range applies only to samples taken after fasting for at least 8 hours. Glucose, capillary     Status: Abnormal  Collection Time: 03/24/20 12:15 PM Result Value Ref Range  Glucose-Capillary 188 (H) 70 - 99 mg/dL   Comment: Glucose reference range applies only to samples taken after fasting for at least 8 hours.  Comment 1 JTK   Comment 2 Repeat Test  Glucose, capillary     Status: Abnormal  Collection Time: 03/24/20  4:39 PM Result Value Ref Range  Glucose-Capillary 182 (H) 70 - 99 mg/dL   Comment: Glucose reference range applies only to samples taken after fasting for at least 8 hours. Glucose, capillary     Status: Abnormal  Collection Time: 03/24/20 10:33 PM Result Value Ref Range  Glucose-Capillary 207  (H) 70 - 99 mg/dL   Comment: Glucose reference range applies only to samples taken after fasting for at least 8 hours. Glucose, capillary     Status: Abnormal  Collection Time: 03/25/20  8:17 AM Result Value Ref Range  Glucose-Capillary 132 (H) 70 - 99 mg/dL   Comment: Glucose reference range applies only to samples taken after fasting for at least 8 hours. Glucose, capillary     Status: Abnormal  Collection Time: 03/25/20 11:20 AM Result Value Ref Range  Glucose-Capillary 264 (H) 70 - 99 mg/dL   Comment: Glucose reference range applies only to samples taken after fasting for at least 8 hours. Basic metabolic panel     Status: Abnormal  Collection Time: 03/25/20  1:08 PM Result Value Ref Range  Sodium 133 (L) 135 - 145 mmol/L  Potassium 4.1 3.5 - 5.1 mmol/L  Chloride 97 (L) 98 - 111 mmol/L  CO2 25 22 - 32 mmol/L  Glucose, Bld 211 (H) 70 - 99 mg/dL   Comment: Glucose reference range applies only to samples taken after fasting for at least 8 hours.  BUN 93 (H) 8 - 23 mg/dL  Creatinine, Ser 5.19 (H) 0.61 - 1.24 mg/dL  Calcium 7.9 (L) 8.9 - 10.3 mg/dL  GFR calc non Af Amer 11 (L) >60 mL/min  GFR calc Af Amer 12 (L) >60 mL/min  Anion gap 11 5 - 15   Comment: Performed at [facility] [address] [place],  [address] CBC     Status: Abnormal  Collection Time: 03/25/20  1:08 PM Result Value Ref Range  WBC 9.2 4.0 - 10.5 K/uL  RBC 2.86 (L) 4.22 - 5.81 MIL/uL  Hemoglobin 8.8 (L) 13.0 - 17.0 g/dL  HCT 28.0 (L) 39 - 52 %  MCV 97.9 80.0 - 100.0 fL  MCH 30.8 26.0 - 34.0 pg  MCHC 31.4 30.0 - 36.0 g/dL  RDW 16.7 (H) 11.5 - 15.5 %  Platelets 141 (L) 150 - 400 K/uL  nRBC 0.0 0.0 - 0.2 %   Comment: Performed at [facility] [address], [place], [place] [address] Magnesium     Status: Abnormal  Collection Time: 03/25/20  1:08 PM Result Value Ref Range  Magnesium 2.8 (H) 1.7 - 2.4 mg/dL   Comment: Performed at [facility] [address], [place],  [place] [address] CBC with Differential/Platelet     Status: Abnormal  Collection Time: 04/01/20  8:13 AM Result Value Ref Range  WBC 8.1 4.0 - 10.5 K/uL  RBC 3.00 (L) 4.22 - 5.81 MIL/uL  Hemoglobin 9.4 (L) 13.0 - 17.0 g/dL  HCT 29.5 (L) 39 - 52 %  MCV 98.3 80.0 - 100.0 fL  MCH 31.3 26.0 - 34.0 pg  MCHC 31.9 30.0 - 36.0 g/dL  RDW 15.9 (H) 11.5 - 15.5 %  Platelets 151 150 - 400 K/uL  nRBC 0.0 0.0 - 0.2 %  Neutrophils Relative % 69 %  Neutro Abs 5.6 1.7 - 7.7 K/uL  Lymphocytes Relative 18 %  Lymphs Abs 1.5 0.7 - 4.0 K/uL  Monocytes Relative 9 %  Monocytes Absolute 0.8 0.1 - 1.0 K/uL  Eosinophils Relative 3 %  Eosinophils Absolute 0.2 0.0 - 0.5 K/uL  Basophils Relative 1 %  Basophils Absolute 0.0 0.0 - 0.1 K/uL  Immature Granulocytes 0 %  Abs Immature Granulocytes 0.03 0.00 - 0.07 K/uL   Comment: Performed at [facility], [address], [place], [place] [address] Basic metabolic panel     Status: Abnormal  Collection Time: 04/01/20  8:13 AM Result Value Ref Range  Sodium 140 135 - 145 mmol/L  Potassium 4.3 3.5 - 5.1 mmol/L  Chloride 102 98 - 111 mmol/L  CO2 27 22 - 32 mmol/L  Glucose, Bld 160 (H) 70 - 99 mg/dL   Comment: Glucose reference range applies only to samples taken after fasting for at least 8 hours.  BUN 59 (H) 8 - 23 mg/dL  Creatinine, Ser 3.77 (H) 0.61 - 1.24 mg/dL  Calcium 8.3 (L) 8.9 - 10.3 mg/dL  GFR calc non Af Amer 16 (L) >60 mL/min  GFR calc Af Amer 18 (L) >60 mL/min  Anion gap 11 5 - 15   Comment: Performed at [facility], [address] [place] [place], [place] [address] Basic metabolic panel     Status: Abnormal  Collection Time: 04/09/20  9:16 AM Result Value Ref Range  Sodium 135 135 - 145 mmol/L  Potassium 4.7 3.5 - 5.1 mmol/L  Chloride 97 (L) 98 - 111 mmol/L  CO2 22 22 - 32 mmol/L  Glucose, Bld 252 (H) 70 - 99 mg/dL   Comment: Glucose reference range applies only to samples taken after fasting for at least 8 hours.  BUN 94 (H) 8 - 23  mg/dL   Comment: RESULT CONFIRMED BY MANUAL DILUTION.PMF  Creatinine, Ser 3.99 (H) 0.61 - 1.24 mg/dL  Calcium 8.5 (L) 8.9 - 10.3 mg/dL  GFR calc non Af Amer 15 (L) >60 mL/min  GFR calc Af Amer 17 (L) >60 mL/min  Anion gap 16 (H) 5 - 15   Comment: Performed at [facility], [address], [place], [place] [address] CBC     Status: Abnormal  Collection Time: 04/09/20  9:16 AM Result Value  Ref Range  WBC 10.3 4.0 - 10.5 K/uL  RBC 2.02 (L) 4.22 - 5.81 MIL/uL  Hemoglobin 6.5 (L) 13.0 - 17.0 g/dL  HCT 19.7 (L) 39 - 52 %  MCV 97.5 80.0 - 100.0 fL  MCH 32.2 26.0 - 34.0 pg  MCHC 33.0 30.0 - 36.0 g/dL  RDW 16.5 (H) 11.5 - 15.5 %  Platelets 162 150 - 400 K/uL  nRBC 0.0 0.0 - 0.2 %   Comment: Performed at [facility], [address] [place], [place] [address] Troponin I (High Sensitivity)     Status: Abnormal  Collection Time: 04/09/20  9:16 AM Result Value Ref Range  Troponin I (High Sensitivity) 129 (HH) <18 ng/L   Comment: CRITICAL RESULT CALLED TO, READ BACK BY AND VERIFIED WITH JESSICA COLETRAIN 04/09/20 1000 KLW (NOTE) Elevated high sensitivity troponin I (hsTnI) values and significant  changes across serial measurements may suggest ACS but many other  chronic and acute conditions are known to elevate hsTnI results.  Refer to the "Links" section for chest pain algorithms and additional  guidance. Performed at [facility], [address] [place], [place] [address]  Prepare RBC (crossmatch)     Status: None  Collection Time: 04/09/20  9:57 AM Result Value Ref Range  Order Confirmation     ORDER PROCESSED BY BLOOD BANK Performed at [facility], [address] [place] [place], [place] [address]  Type and screen [place]     Status: None  Collection Time: 04/09/20 10:13 AM Result Value Ref Range  ABO/RH(D) AB POS   Antibody Screen NEG   Sample Expiration 04/12/2020,2359   Unit Number [id number]   Blood Component  Type RED CELLS,LR   Unit division 00   Status of Unit ISSUED,FINAL   Transfusion Status OK TO TRANSFUSE   Crossmatch Result Compatible   Unit Number [id number]   Blood Component Type RED CELLS,LR   Unit division 00   Status of Unit ISSUED,FINAL   Transfusion Status OK TO TRANSFUSE   Crossmatch Result Compatible   Unit Number [id number]   Blood Component Type RED CELLS,LR   Unit division 00   Status of Unit ISSUED,FINAL   Transfusion Status OK TO TRANSFUSE   Crossmatch Result Compatible   Unit Number [id number]   Blood Component Type RED CELLS,LR   Unit division 00   Status of Unit REL FROM [facility]   Transfusion Status OK TO TRANSFUSE   Crossmatch Result     Compatible Performed at [facility], [address] [place], [place] [address]   Unit Number [id number]   Blood Component Type RED CELLS,LR   Unit division 00   Status of Unit REL FROM [facility]   Transfusion Status OK TO TRANSFUSE   Crossmatch Result Compatible  BPAM RBC     Status: None  Collection Time: 04/09/20 10:13 AM Result Value Ref Range  ISSUE DATE / TIME 301601093235   Blood Product Unit Number [id number]   PRODUCT CODE [id number]   Unit Type and Rh 6200   Blood Product Expiration Date 517616073710   ISSUE DATE / TIME 626948546270   Blood Product Unit Number [id number]   PRODUCT CODE [id number]   Unit Type and Rh 6200   Blood Product Expiration Date 381017510258   ISSUE DATE / TIME 527782423536   Blood Product Unit Number [id number]   PRODUCT CODE [id number]   Unit Type and Rh 0600   Blood Product Expiration Date 712458099833   Blood Product Unit Number [id number]  PRODUCT CODE [id number]   Unit Type and Rh 6200   Blood Product Expiration Date 202110122359   Blood Product Unit Number [id number]   PRODUCT CODE [id number]   Unit Type and Rh 6200   Blood Product Expiration Date 202110122359  SARS Coronavirus 2 by RT PCR (hospital order, performed in [facility] hospital lab)  Nasopharyngeal Nasopharyngeal Swab     Status: None  Collection Time: 04/09/20 11:12 AM  Specimen: Nasopharyngeal Swab Result Value Ref Range  SARS Coronavirus 2 NEGATIVE NEGATIVE   Comment: (NOTE) SARS-CoV-2 target nucleic acids are NOT DETECTED.  The SARS-CoV-2 RNA is generally detectable in upper and lower respiratory specimens during the acute phase of infection. The lowest concentration of SARS-CoV-2 viral copies this assay can detect is 250 copies / mL. A negative result does not preclude SARS-CoV-2 infection and should not be used as the sole basis for treatment or other patient management decisions.  A negative result may occur with improper specimen collection / handling, submission of specimen other than nasopharyngeal swab, presence of viral mutation(s) within the areas targeted by this assay, and inadequate number of viral copies (<250 copies / mL). A negative result must be combined with clinical observations, patient history, and epidemiological information.  Fact Sheet for Patients:   StrictlyIdeas.no  Fact Sheet for Healthcare Providers: BankingDealers.co.za  This test is not yet approved or  cleared by the [place] FDA and has been authorized for detection and/or diagnosis of SARS-CoV-2 by FDA under an Emergency Use Authorization (EUA).  This EUA will remain in effect (meaning this test can be used) for the duration of the COVID-19 declaration under Section 564(b)(1) of the Act, 21 U.S.C. section 360bbb-3(b)(1), unless the authorization is terminated or revoked sooner.  Performed at [facility], [address] [place], [place] [address]  Troponin I (High Sensitivity)     Status: Abnormal  Collection Time: 04/09/20 11:15 AM Result Value Ref Range  Troponin I (High Sensitivity) 223 (HH) <18 ng/L   Comment: CRITICAL VALUE NOTED. VALUE IS CONSISTENT WITH PREVIOUSLY REPORTED/CALLED VALUE  SNG (NOTE) Elevated high sensitivity troponin I (hsTnI) values and significant  changes across serial measurements may suggest ACS but many other  chronic and acute conditions are known to elevate hsTnI results.  Refer to the "Links" section for chest pain algorithms and additional  guidance. Performed at [facility], [place], [place] [address]  Troponin I (High Sensitivity)     Status: Abnormal  Collection Time: 04/09/20  2:28 PM Result Value Ref Range  Troponin I (High Sensitivity) 402 (HH) <18 ng/L   Comment: CRITICAL VALUE NOTED. VALUE IS CONSISTENT WITH PREVIOUSLY REPORTED/CALLED VALUE.PMF (NOTE) Elevated high sensitivity troponin I (hsTnI) values and significant  changes across serial measurements may suggest ACS but many other  chronic and acute conditions are known to elevate hsTnI results.  Refer to the "Links" section for chest pain algorithms and additional  guidance. Performed at [facility], [address] [place], [place] [address]  Glucose, capillary     Status: Abnormal  Collection Time: 04/09/20  4:06 PM Result Value Ref Range  Glucose-Capillary 162 (H) 70 - 99 mg/dL   Comment: Glucose reference range applies only to samples taken after fasting for at least 8 hours. Hemoglobin and hematocrit, blood     Status: Abnormal  Collection Time: 04/09/20  4:07 PM Result Value Ref Range  Hemoglobin 7.6 (L) 13.0 - 17.0 g/dL  HCT 23.1 (L) 39 - 52 %  Comment: Performed at [facility], [address] [place], [place] [address] Troponin I (High Sensitivity)     Status: Abnormal  Collection Time: 04/09/20  4:07 PM Result Value Ref Range  Troponin I (High Sensitivity) 604 (HH) <18 ng/L   Comment: CRITICAL VALUE NOTED. VALUE IS CONSISTENT WITH PREVIOUSLY REPORTED/CALLED VALUE.PMF (NOTE) Elevated high sensitivity troponin I (hsTnI) values and significant  changes across serial measurements may suggest ACS but many other   chronic and acute conditions are known to elevate hsTnI results.  Refer to the "Links" section for chest pain algorithms and additional  guidance. Performed at [facility], [address] [place], [place] [address]  Glucose, capillary     Status: Abnormal  Collection Time: 04/09/20  7:35 PM Result Value Ref Range  Glucose-Capillary 147 (H) 70 - 99 mg/dL   Comment: Glucose reference range applies only to samples taken after fasting for at least 8 hours. Glucose, capillary     Status: Abnormal  Collection Time: 04/10/20  4:49 AM Result Value Ref Range  Glucose-Capillary 136 (H) 70 - 99 mg/dL   Comment: Glucose reference range applies only to samples taken after fasting for at least 8 hours. Basic metabolic panel     Status: Abnormal  Collection Time: 04/10/20  5:01 AM Result Value Ref Range  Sodium 134 (L) 135 - 145 mmol/L  Potassium 4.3 3.5 - 5.1 mmol/L  Chloride 96 (L) 98 - 111 mmol/L  CO2 25 22 - 32 mmol/L  Glucose, Bld 189 (H) 70 - 99 mg/dL   Comment: Glucose reference range applies only to samples taken after fasting for at least 8 hours.  BUN 100 (H) 8 - 23 mg/dL  Creatinine, Ser 4.03 (H) 0.61 - 1.24 mg/dL  Calcium 8.7 (L) 8.9 - 10.3 mg/dL  GFR calc non Af Amer 14 (L) >60 mL/min  GFR calc Af Amer 17 (L) >60 mL/min  Anion gap 13 5 - 15   Comment: Performed at [facility], [address] [place], [place] [address] Hemoglobin and hematocrit, blood     Status: Abnormal  Collection Time: 04/10/20  5:01 AM Result Value Ref Range  Hemoglobin 8.0 (L) 13.0 - 17.0 g/dL  HCT 24.3 (L) 39 - 52 %   Comment: Performed at [facility], [address] [place], [place] [address] Glucose, capillary     Status: Abnormal  Collection Time: 04/10/20  7:32 AM Result Value Ref Range  Glucose-Capillary 160 (H) 70 - 99 mg/dL   Comment: Glucose reference range applies only to samples taken after fasting for at least 8 hours. Glucose, capillary     Status:  Abnormal  Collection Time: 04/10/20 11:36 AM Result Value Ref Range  Glucose-Capillary 149 (H) 70 - 99 mg/dL   Comment: Glucose reference range applies only to samples taken after fasting for at least 8 hours. CBC     Status: Abnormal  Collection Time: 04/10/20  4:03 PM Result Value Ref Range  WBC 8.7 4.0 - 10.5 K/uL  RBC 2.49 (L) 4.22 - 5.81 MIL/uL  Hemoglobin 7.7 (L) 13.0 - 17.0 g/dL  HCT 23.3 (L) 39 - 52 %  MCV 93.6 80.0 - 100.0 fL  MCH 30.9 26.0 - 34.0 pg  MCHC 33.0 30.0 - 36.0 g/dL  RDW 17.2 (H) 11.5 - 15.5 %  Platelets 160 150 - 400 K/uL  nRBC 0.0 0.0 - 0.2 %   Comment: Performed at [facility], [address] [place], [place] [address] Glucose, capillary  Status: Abnormal  Collection Time: 04/10/20  4:11 PM Result Value Ref Range  Glucose-Capillary 161 (H) 70 - 99 mg/dL   Comment: Glucose reference range applies only to samples taken after fasting for at least 8 hours. Prepare RBC (crossmatch)     Status: None  Collection Time: 04/10/20  4:41 PM Result Value Ref Range  Order Confirmation     ORDER PROCESSED BY BLOOD BANK Performed at [facility], [address] [place], [place] [address]  Glucose, capillary     Status: Abnormal  Collection Time: 04/10/20  8:18 PM Result Value Ref Range  Glucose-Capillary 139 (H) 70 - 99 mg/dL   Comment: Glucose reference range applies only to samples taken after fasting for at least 8 hours. Glucose, capillary     Status: Abnormal  Collection Time: 04/11/20 12:11 AM Result Value Ref Range  Glucose-Capillary 148 (H) 70 - 99 mg/dL   Comment: Glucose reference range applies only to samples taken after fasting for at least 8 hours. Glucose, capillary     Status: Abnormal  Collection Time: 04/11/20  5:01 AM Result Value Ref Range  Glucose-Capillary 110 (H) 70 - 99 mg/dL   Comment: Glucose reference range applies only to samples taken after fasting for at least 8 hours. Glucose, capillary     Status:  Abnormal  Collection Time: 04/11/20  8:16 AM Result Value Ref Range  Glucose-Capillary 121 (H) 70 - 99 mg/dL   Comment: Glucose reference range applies only to samples taken after fasting for at least 8 hours. CBC     Status: Abnormal  Collection Time: 04/11/20  9:03 AM Result Value Ref Range  WBC 7.0 4.0 - 10.5 K/uL  RBC 2.86 (L) 4.22 - 5.81 MIL/uL  Hemoglobin 8.7 (L) 13.0 - 17.0 g/dL  HCT 25.4 (L) 39 - 52 %  MCV 88.8 80.0 - 100.0 fL  MCH 30.4 26.0 - 34.0 pg  MCHC 34.3 30.0 - 36.0 g/dL  RDW 19.2 (H) 11.5 - 15.5 %  Platelets 163 150 - 400 K/uL  nRBC 0.0 0.0 - 0.2 %   Comment: Performed at [facility], [address] [place], [place] [address] Basic metabolic panel     Status: Abnormal  Collection Time: 04/11/20  9:03 AM Result Value Ref Range  Sodium 136 135 - 145 mmol/L  Potassium 3.8 3.5 - 5.1 mmol/L  Chloride 98 98 - 111 mmol/L  CO2 24 22 - 32 mmol/L  Glucose, Bld 138 (H) 70 - 99 mg/dL   Comment: Glucose reference range applies only to samples taken after fasting for at least 8 hours.  BUN 95 (H) 8 - 23 mg/dL  Creatinine, Ser 3.87 (H) 0.61 - 1.24 mg/dL  Calcium 8.3 (L) 8.9 - 10.3 mg/dL  GFR calc non Af Amer 15 (L) >60 mL/min  GFR calc Af Amer 18 (L) >60 mL/min  Anion gap 14 5 - 15   Comment: Performed at [facility], [address] [place], [place] [address] Troponin I (High Sensitivity)     Status: Abnormal  Collection Time: 04/11/20  9:03 AM Result Value Ref Range  Troponin I (High Sensitivity) 1,471 (HH) <18 ng/L   Comment: CRITICAL VALUE NOTED. VALUE IS CONSISTENT WITH PREVIOUSLY REPORTED/CALLED VALUE...[place] (NOTE) Elevated high sensitivity troponin I (hsTnI) values and significant  changes across serial measurements may suggest ACS but many other  chronic and acute conditions are known to elevate hsTnI results.  Refer to the "Links" section for chest pain algorithms and additional  guidance. Performed at [facility], [address] [place], [place] [address]  Glucose, capillary     Status: Abnormal  Collection Time: 04/11/20 11:37 AM Result Value Ref Range  Glucose-Capillary 109 (H) 70 - 99 mg/dL   Comment: Glucose reference range applies only to samples taken after fasting for at least 8 hours. Glucose, capillary     Status: None  Collection Time: 04/11/20 12:49 PM Result Value Ref Range  Glucose-Capillary 97 70 - 99 mg/dL   Comment: Glucose reference range applies only to samples taken after fasting for at least 8 hours. Glucose, capillary     Status: Abnormal  Collection Time: 04/11/20  4:35 PM Result Value Ref Range  Glucose-Capillary 202 (H) 70 - 99 mg/dL   Comment: Glucose reference range applies only to samples taken after fasting for at least 8 hours. Glucose, capillary     Status: Abnormal  Collection Time: 04/11/20  8:47 PM Result Value Ref Range  Glucose-Capillary 115 (H) 70 - 99 mg/dL   Comment: Glucose reference range applies only to samples taken after fasting for at least 8 hours. Glucose, capillary     Status: Abnormal  Collection Time: 04/12/20 12:08 AM Result Value Ref Range  Glucose-Capillary 145 (H) 70 - 99 mg/dL   Comment: Glucose reference range applies only to samples taken after fasting for at least 8 hours. Glucose, capillary     Status: Abnormal  Collection Time: 04/12/20  4:05 AM Result Value Ref Range  Glucose-Capillary 102 (H) 70 - 99 mg/dL   Comment: Glucose reference range applies only to samples taken after fasting for at least 8 hours. CBC     Status: Abnormal  Collection Time: 04/12/20  5:20 AM Result Value Ref Range  WBC 6.8 4.0 - 10.5 K/uL  RBC 2.87 (L) 4.22 - 5.81 MIL/uL  Hemoglobin 8.7 (L) 13.0 - 17.0 g/dL  HCT 25.9 (L) 39 - 52 %  MCV 90.2 80.0 - 100.0 fL  MCH 30.3 26.0 - 34.0 pg  MCHC 33.6 30.0 - 36.0 g/dL  RDW 19.0 (H) 11.5 - 15.5 %  Platelets 171 150 - 400 K/uL  nRBC 0.0 0.0 - 0.2 %   Comment: Performed at [facility], [address],  [place], [place] [address] Basic metabolic panel     Status: Abnormal  Collection Time: 04/12/20  5:20 AM Result Value Ref Range  Sodium 136 135 - 145 mmol/L  Potassium 3.7 3.5 - 5.1 mmol/L  Chloride 98 98 - 111 mmol/L  CO2 25 22 - 32 mmol/L  Glucose, Bld 120 (H) 70 - 99 mg/dL   Comment: Glucose reference range applies only to samples taken after fasting for at least 8 hours.  BUN 90 (H) 8 - 23 mg/dL  Creatinine, Ser 3.89 (H) 0.61 - 1.24 mg/dL  Calcium 8.2 (L) 8.9 - 10.3 mg/dL  GFR calc non Af Amer 15 (L) >60 mL/min  GFR calc Af Amer 18 (L) >60 mL/min  Anion gap 13 5 - 15   Comment: Performed at [facility], [address] [place], [place] [address] Glucose, capillary     Status: Abnormal  Collection Time: 04/12/20  8:28 AM Result Value Ref Range  Glucose-Capillary 126 (H) 70 - 99 mg/dL   Comment: Glucose reference range applies only to samples taken after fasting for at least 8 hours. Glucose, capillary     Status: Abnormal  Collection Time: 04/12/20 12:04 PM Result Value Ref Range  Glucose-Capillary 109 (H) 70 - 99  mg/dL   Comment: Glucose reference range applies only to samples taken after fasting for at least 8 hours. APTT     Status: None  Collection Time: 05/18/20 10:57 AM Result Value Ref Range  aPTT 30 24 - 36 seconds   Comment: Performed at [facility], [address] [place], [place] [address] Basic metabolic panel     Status: Abnormal  Collection Time: 05/18/20 10:57 AM Result Value Ref Range  Sodium 139 135 - 145 mmol/L  Potassium 4.9 3.5 - 5.1 mmol/L  Chloride 105 98 - 111 mmol/L  CO2 26 22 - 32 mmol/L  Glucose, Bld 185 (H) 70 - 99 mg/dL   Comment: Glucose reference range applies only to samples taken after fasting for at least 8 hours.  BUN 55 (H) 8 - 23 mg/dL  Creatinine, Ser 3.25 (H) 0.61 - 1.24 mg/dL  Calcium 8.5 (L) 8.9 - 10.3 mg/dL  GFR, Estimated 20 (L) >60 mL/min   Comment: (NOTE) Calculated using the CKD-EPI Creatinine Equation  (2021)   Anion gap 8 5 - 15   Comment: Performed at [facility], [address] [place], [place] [address] CBC WITH DIFFERENTIAL     Status: Abnormal  Collection Time: 05/18/20 10:57 AM Result Value Ref Range  WBC 8.2 4.0 - 10.5 K/uL  RBC 2.55 (L) 4.22 - 5.81 MIL/uL  Hemoglobin 7.5 (L) 13.0 - 17.0 g/dL  HCT 24.3 (L) 39 - 52 %  MCV 95.3 80.0 - 100.0 fL  MCH 29.4 26.0 - 34.0 pg  MCHC 30.9 30.0 - 36.0 g/dL  RDW 16.6 (H) 11.5 - 15.5 %  Platelets 160 150 - 400 K/uL  nRBC 0.0 0.0 - 0.2 %  Neutrophils Relative % 69 %  Neutro Abs 5.7 1.7 - 7.7 K/uL  Lymphocytes Relative 21 %  Lymphs Abs 1.7 0.7 - 4.0 K/uL  Monocytes Relative 7 %  Monocytes Absolute 0.6 0.1 - 1.0 K/uL  Eosinophils Relative 2 %  Eosinophils Absolute 0.2 0.0 - 0.5 K/uL  Basophils Relative 1 %  Basophils Absolute 0.0 0.0 - 0.1 K/uL  Immature Granulocytes 0 %  Abs Immature Granulocytes 0.03 0.00 - 0.07 K/uL   Comment: Performed at [facility], [address] [place], [place] [address] Protime-INR     Status: None  Collection Time: 05/18/20 10:57 AM Result Value Ref Range  Prothrombin Time 13.0 11.4 - 15.2 seconds  INR 1.0 0.8 - 1.2   Comment: (NOTE) INR goal varies based on device and disease states. Performed at [facility], [address] [place], [place] [address]  Type and screen     Status: None  Collection Time: 05/18/20 10:57 AM Result Value Ref Range  ABO/RH(D) AB POS   Antibody Screen NEG   Sample Expiration 05/21/2020,2359   Extend sample reason TRANSFUSED IN PAST 3 MONTHS, UNABLE TO EXTEND   Unit Number [id number]   Blood Component Type RED CELLS,LR   Unit division 00   Status of Unit ISSUED,FINAL   Transfusion Status OK TO TRANSFUSE   Crossmatch Result     Compatible Performed at [facility], [address] [place], [place] [address]  BPAM RBC     Status: None  Collection Time: 05/18/20 10:57 AM Result Value Ref Range  ISSUE DATE /  TIME [id number]   Blood Product Unit Number [id number]   PRODUCT CODE [id number]   Unit Type and Rh 6200   Blood Product Expiration Date [id number]  SARS CORONAVIRUS 2 (TAT 6-24 HRS)  Nasopharyngeal Nasopharyngeal Swab     Status: None  Collection Time: 05/18/20 11:54 AM  Specimen: Nasopharyngeal Swab Result Value Ref Range  SARS Coronavirus 2 NEGATIVE NEGATIVE   Comment: (NOTE) SARS-CoV-2 target nucleic acids are NOT DETECTED.  The SARS-CoV-2 RNA is generally detectable in upper and lower respiratory specimens during the acute phase of infection. Negative results do not preclude SARS-CoV-2 infection, do not rule out co-infections with other pathogens, and should not be used as the sole basis for treatment or other patient management decisions. Negative results must be combined with clinical observations, patient history, and epidemiological information. The expected result is Negative.  Fact Sheet for Patients: SugarRoll.be  Fact Sheet for Healthcare Providers: https://www.woods-mathews.com/  This test is not yet approved or cleared by the [place] FDA and  has been authorized for detection and/or diagnosis of SARS-CoV-2 by FDA under an Emergency Use Authorization (EUA). This EUA will remain  in effect (meaning this test can be used) for the duration of the COVID-19 declaration under Se ction 564(b)(1) of the Act, 21 U.S.C. section 360bbb-3(b)(1), unless the authorization is terminated or revoked sooner.  Performed at [facility], [place] [address] [place], [place] [address]  Glucose, capillary     Status: Abnormal  Collection Time: 05/20/20  6:28 AM Result Value Ref Range  Glucose-Capillary 44 (LL) 70 - 99 mg/dL   Comment: Glucose reference range applies only to samples taken after fasting for at least 8 hours.  Comment 1 Notify RN  Glucose, capillary     Status: Abnormal  Collection Time: 05/20/20  6:49  AM Result Value Ref Range  Glucose-Capillary 66 (L) 70 - 99 mg/dL   Comment: Glucose reference range applies only to samples taken after fasting for at least 8 hours. Glucose, capillary     Status: Abnormal  Collection Time: 05/20/20  7:12 AM Result Value Ref Range  Glucose-Capillary 114 (H) 70 - 99 mg/dL   Comment: Glucose reference range applies only to samples taken after fasting for at least 8 hours. Glucose, capillary     Status: None  Collection Time: 05/20/20  8:29 AM Result Value Ref Range  Glucose-Capillary 73 70 - 99 mg/dL   Comment: Glucose reference range applies only to samples taken after fasting for at least 8 hours. Prepare RBC (crossmatch)     Status: None  Collection Time: 05/20/20  8:41 AM Result Value Ref Range  Order Confirmation     ORDER PROCESSED BY BLOOD BANK Performed at [facility], [address] [place], [place] [address]  Glucose, capillary     Status: Abnormal  Collection Time: 05/20/20  8:55 AM Result Value Ref Range  Glucose-Capillary 119 (H) 70 - 99 mg/dL   Comment: Glucose reference range applies only to samples taken after fasting for at least 8 hours. Prepare RBC (crossmatch)     Status: None  Collection Time: 05/20/20  9:12 AM Result Value Ref Range  Order Confirmation     DUPLICATE REQUEST Performed at [facility], [address] [place], [place] [address]  Blood gas, arterial     Status: Abnormal  Collection Time: 05/20/20 10:00 AM Result Value Ref Range  FIO2 0.24   Inspiratory PAP 12   Expiratory PAP 6   pH, Arterial 7.39 7.35 - 7.45  pCO2 arterial 38 32 - 48 mmHg  pO2, Arterial 110 (H) 83 - 108 mmHg  Bicarbonate 23.0 20.0 - 28.0 mmol/L  Acid-base deficit 1.8 0.0 - 2.0 mmol/L  O2  Saturation 98.2 %  Patient temperature 37.0   Collection site RIGHT RADIAL   Sample type ARTERIAL DRAW   Allens test (pass/fail) PASS PASS   Comment: Performed at [facility], [address] [place], [place]  [address] Glucose, capillary     Status: Abnormal  Collection Time: 05/20/20 11:26 AM Result Value Ref Range  Glucose-Capillary 68 (L) 70 - 99 mg/dL   Comment: Glucose reference range applies only to samples taken after fasting for at least 8 hours. Glucose, capillary     Status: None  Collection Time: 05/20/20 12:14 PM Result Value Ref Range  Glucose-Capillary 89 70 - 99 mg/dL   Comment: Glucose reference range applies only to samples taken after fasting for at least 8 hours. Troponin I (High Sensitivity)     Status: Abnormal  Collection Time: 05/20/20  3:09 PM Result Value Ref Range  Troponin I (High Sensitivity) 70 (H) <18 ng/L   Comment: (NOTE) Elevated high sensitivity troponin I (hsTnI) values and significant  changes across serial measurements may suggest ACS but many other  chronic and acute conditions are known to elevate hsTnI results.  Refer to the "Links" section for chest pain algorithms and additional  guidance. Performed at [facility], [address] [place], [place] [address]  Glucose, capillary     Status: Abnormal  Collection Time: 05/20/20  3:23 PM Result Value Ref Range  Glucose-Capillary 137 (H) 70 - 99 mg/dL   Comment: Glucose reference range applies only to samples taken after fasting for at least 8 hours. Troponin I (High Sensitivity)     Status: Abnormal  Collection Time: 05/20/20  5:27 PM Result Value Ref Range  Troponin I (High Sensitivity) 72 (H) <18 ng/L   Comment: (NOTE) Elevated high sensitivity troponin I (hsTnI) values and significant  changes across serial measurements may suggest ACS but many other  chronic and acute conditions are known to elevate hsTnI results.  Refer to the "Links" section for chest pain algorithms and additional  guidance. Performed at [facility], [address] [place], [place] [address]  Glucose, capillary     Status: Abnormal  Collection Time: 05/20/20  7:13 PM Result Value Ref  Range  Glucose-Capillary 164 (H) 70 - 99 mg/dL   Comment: Glucose reference range applies only to samples taken after fasting for at least 8 hours. Glucose, capillary     Status: Abnormal  Collection Time: 05/20/20 10:42 PM Result Value Ref Range  Glucose-Capillary 142 (H) 70 - 99 mg/dL   Comment: Glucose reference range applies only to samples taken after fasting for at least 8 hours. Glucose, capillary     Status: Abnormal  Collection Time: 05/21/20  5:02 AM Result Value Ref Range  Glucose-Capillary 149 (H) 70 - 99 mg/dL   Comment: Glucose reference range applies only to samples taken after fasting for at least 8 hours. Basic metabolic panel     Status: Abnormal  Collection Time: 05/21/20  6:11 AM Result Value Ref Range  Sodium 135 135 - 145 mmol/L  Potassium 3.9 3.5 - 5.1 mmol/L  Chloride 102 98 - 111 mmol/L  CO2 22 22 - 32 mmol/L  Glucose, Bld 153 (H) 70 - 99 mg/dL   Comment: Glucose reference range applies only to samples taken after fasting for at least 8 hours.  BUN 71 (H) 8 - 23 mg/dL  Creatinine, Ser 3.79 (H) 0.61 - 1.24 mg/dL  Calcium 7.9 (L) 8.9 - 10.3 mg/dL  GFR, Estimated 17 (L) >60 mL/min  Comment: (NOTE) Calculated using the CKD-EPI Creatinine Equation (2021)   Anion gap 11 5 - 15   Comment: Performed at [facility], [address] [place], [place] [address] Glucose, capillary     Status: Abnormal  Collection Time: 05/21/20  7:54 AM Result Value Ref Range  Glucose-Capillary 158 (H) 70 - 99 mg/dL   Comment: Glucose reference range applies only to samples taken after fasting for at least 8 hours. Glucose, capillary     Status: Abnormal  Collection Time: 05/21/20 11:39 AM Result Value Ref Range  Glucose-Capillary 127 (H) 70 - 99 mg/dL   Comment: Glucose reference range applies only to samples taken after fasting for at least 8 hours. Glucose, capillary     Status: Abnormal  Collection Time: 05/21/20  4:38 PM Result Value Ref  Range  Glucose-Capillary 214 (H) 70 - 99 mg/dL   Comment: Glucose reference range applies only to samples taken after fasting for at least 8 hours. Glucose, capillary     Status: Abnormal  Collection Time: 05/21/20  9:08 PM Result Value Ref Range  Glucose-Capillary 220 (H) 70 - 99 mg/dL   Comment: Glucose reference range applies only to samples taken after fasting for at least 8 hours. Basic metabolic panel     Status: Abnormal  Collection Time: 05/22/20  5:17 AM Result Value Ref Range  Sodium 136 135 - 145 mmol/L  Potassium 4.0 3.5 - 5.1 mmol/L  Chloride 100 98 - 111 mmol/L  CO2 24 22 - 32 mmol/L  Glucose, Bld 203 (H) 70 - 99 mg/dL   Comment: Glucose reference range applies only to samples taken after fasting for at least 8 hours.  BUN 78 (H) 8 - 23 mg/dL  Creatinine, Ser 3.94 (H) 0.61 - 1.24 mg/dL  Calcium 7.9 (L) 8.9 - 10.3 mg/dL  GFR, Estimated 16 (L) >60 mL/min   Comment: (NOTE) Calculated using the CKD-EPI Creatinine Equation (2021)   Anion gap 12 5 - 15   Comment: Performed at [facility], [address] [place], [place] [address] Glucose, capillary     Status: Abnormal  Collection Time: 05/22/20  7:36 AM Result Value Ref Range  Glucose-Capillary 159 (H) 70 - 99 mg/dL   Comment: Glucose reference range applies only to samples taken after fasting for at least 8 hours. CBC with Differential/Platelet     Status: Abnormal  Collection Time: 05/22/20 11:19 AM Result Value Ref Range  WBC 6.2 4.0 - 10.5 K/uL  RBC 2.01 (L) 4.22 - 5.81 MIL/uL  Hemoglobin 5.9 (L) 13.0 - 17.0 g/dL  HCT 18.9 (L) 39 - 52 %  MCV 94.0 80.0 - 100.0 fL  MCH 29.4 26.0 - 34.0 pg  MCHC 31.2 30.0 - 36.0 g/dL  RDW 17.0 (H) 11.5 - 15.5 %  Platelets 121 (L) 150 - 400 K/uL  nRBC 0.0 0.0 - 0.2 %  Neutrophils Relative % 67 %  Neutro Abs 4.2 1.7 - 7.7 K/uL  Lymphocytes Relative 22 %  Lymphs Abs 1.4 0.7 - 4.0 K/uL  Monocytes Relative 9 %  Monocytes Absolute 0.5 0.1 - 1.0 K/uL  Eosinophils  Relative 2 %  Eosinophils Absolute 0.1 0.0 - 0.5 K/uL  Basophils Relative 0 %  Basophils Absolute 0.0 0.0 - 0.1 K/uL  Immature Granulocytes 0 %  Abs Immature Granulocytes 0.02 0.00 - 0.07 K/uL   Comment: Performed at [facility], [address] [place], [place] [address] Glucose, capillary     Status: Abnormal  Collection Time: 05/22/20  11:30 AM Result Value Ref Range  Glucose-Capillary 174 (H) 70 - 99 mg/dL   Comment: Glucose reference range applies only to samples taken after fasting for at least 8 hours. Prepare RBC (crossmatch)     Status: None  Collection Time: 05/22/20  1:36 PM Result Value Ref Range  Order Confirmation     ORDER PROCESSED BY BLOOD BANK Performed at [facility], [address] [place], [place] [address]  Iron and TIBC     Status: Abnormal  Collection Time: 05/22/20  1:52 PM Result Value Ref Range  Iron 43 (L) 45 - 182 ug/dL  TIBC 441 250 - 450 ug/dL  Saturation Ratios 10 (L) 17.9 - 39.5 %  UIBC 398 ug/dL   Comment: Performed at [facility], [address] [place], [place] [address] Ferritin     Status: None  Collection Time: 05/22/20  1:52 PM Result Value Ref Range  Ferritin 31 24 - 336 ng/mL   Comment: Performed at [facility], [address] [place], [place] [address] Type and screen [place]     Status: None  Collection Time: 05/22/20  1:52 PM Result Value Ref Range  ABO/RH(D) AB POS   Antibody Screen NEG   Sample Expiration 05/25/2020,2359   Unit Number [id number]   Blood Component Type RED CELLS,LR   Unit division 00   Status of Unit ISSUED,FINAL   Transfusion Status OK TO TRANSFUSE   Crossmatch Result Compatible   Unit Number [id number]   Blood Component Type RED CELLS,LR   Unit division 00   Status of Unit ISSUED,FINAL   Transfusion Status OK TO TRANSFUSE   Crossmatch Result     Compatible Performed at [facility], [address] [place],  [place] [address]  BPAM RBC     Status: None  Collection Time: 05/22/20  1:52 PM Result Value Ref Range  ISSUE DATE / TIME [id number]   Blood Product Unit Number [id number]   PRODUCT CODE [id number]   Unit Type and Rh 0600   Blood Product Expiration Date 202111102359   ISSUE DATE / TIME [id number]   Blood Product Unit Number [id number]   PRODUCT CODE [id number]   Unit Type and Rh 6200   Blood Product Expiration Date [id number]  Glucose, capillary     Status: Abnormal  Collection Time: 05/22/20  4:33 PM Result Value Ref Range  Glucose-Capillary 176 (H) 70 - 99 mg/dL   Comment: Glucose reference range applies only to samples taken after fasting for at least 8 hours. Glucose, capillary     Status: Abnormal  Collection Time: 05/22/20  9:21 PM Result Value Ref Range  Glucose-Capillary 202 (H) 70 - 99 mg/dL   Comment: Glucose reference range applies only to samples taken after fasting for at least 8 hours. Basic metabolic panel     Status: Abnormal  Collection Time: 05/23/20  4:29 AM Result Value Ref Range  Sodium 135 135 - 145 mmol/L  Potassium 3.6 3.5 - 5.1 mmol/L  Chloride 98 98 - 111 mmol/L  CO2 26 22 - 32 mmol/L  Glucose, Bld 140 (H) 70 - 99 mg/dL   Comment: Glucose reference range applies only to samples taken after fasting for at least 8 hours.  BUN 81 (H) 8 - 23 mg/dL  Creatinine, Ser 3.78 (H) 0.61 - 1.24 mg/dL  Calcium 7.7 (L) 8.9 - 10.3 mg/dL  GFR, Estimated 17 (L) >60 mL/min   Comment: (NOTE) Calculated using the CKD-EPI Creatinine Equation (2021)  Anion gap 11 5 - 15   Comment: Performed at [facility], [address] [place], [place] [address] CBC with Differential/Platelet     Status: Abnormal  Collection Time: 05/23/20  4:29 AM Result Value Ref Range  WBC 6.8 4.0 - 10.5 K/uL  RBC 2.84 (L) 4.22 - 5.81 MIL/uL  Hemoglobin 8.5 (L) 13.0 - 17.0 g/dL   Comment: REPEATED TO VERIFY POST TRANSFUSION SPECIMEN   HCT 25.8 (L) 39 - 52 %  MCV 90.8 80.0 -  100.0 fL  MCH 29.9 26.0 - 34.0 pg  MCHC 32.9 30.0 - 36.0 g/dL  RDW 17.8 (H) 11.5 - 15.5 %  Platelets 128 (L) 150 - 400 K/uL  nRBC 0.0 0.0 - 0.2 %  Neutrophils Relative % 65 %  Neutro Abs 4.3 1.7 - 7.7 K/uL  Lymphocytes Relative 24 %  Lymphs Abs 1.7 0.7 - 4.0 K/uL  Monocytes Relative 9 %  Monocytes Absolute 0.6 0.1 - 1.0 K/uL  Eosinophils Relative 2 %  Eosinophils Absolute 0.2 0.0 - 0.5 K/uL  Basophils Relative 0 %  Basophils Absolute 0.0 0.0 - 0.1 K/uL  Immature Granulocytes 0 %  Abs Immature Granulocytes 0.03 0.00 - 0.07 K/uL   Comment: Performed at [facility], [address] [place], [place] [address] Glucose, capillary     Status: Abnormal  Collection Time: 05/23/20  7:34 AM Result Value Ref Range  Glucose-Capillary 154 (H) 70 - 99 mg/dL   Comment: Glucose reference range applies only to samples taken after fasting for at least 8 hours. Glucose, capillary     Status: Abnormal  Collection Time: 05/23/20 11:52 AM Result Value Ref Range  Glucose-Capillary 132 (H) 70 - 99 mg/dL   Comment: Glucose reference range applies only to samples taken after fasting for at least 8 hours. Glucose, capillary     Status: Abnormal  Collection Time: 05/23/20  4:37 PM Result Value Ref Range  Glucose-Capillary 152 (H) 70 - 99 mg/dL   Comment: Glucose reference range applies only to samples taken after fasting for at least 8 hours. Glucose, capillary     Status: Abnormal  Collection Time: 05/23/20  8:46 PM Result Value Ref Range  Glucose-Capillary 222 (H) 70 - 99 mg/dL   Comment: Glucose reference range applies only to samples taken after fasting for at least 8 hours. Basic metabolic panel     Status: Abnormal  Collection Time: 05/24/20  6:17 AM Result Value Ref Range  Sodium 135 135 - 145 mmol/L  Potassium 3.9 3.5 - 5.1 mmol/L  Chloride 96 (L) 98 - 111 mmol/L  CO2 24 22 - 32 mmol/L  Glucose, Bld 151 (H) 70 - 99 mg/dL   Comment: Glucose reference range applies only to samples  taken after fasting for at least 8 hours.  BUN 83 (H) 8 - 23 mg/dL  Creatinine, Ser 3.84 (H) 0.61 - 1.24 mg/dL  Calcium 8.2 (L) 8.9 - 10.3 mg/dL  GFR, Estimated 17 (L) >60 mL/min   Comment: (NOTE) Calculated using the CKD-EPI Creatinine Equation (2021)   Anion gap 15 5 - 15   Comment: Performed at [facility], [address] [place], [place] [address] CBC with Differential/Platelet     Status: Abnormal  Collection Time: 05/24/20  6:17 AM Result Value Ref Range  WBC 7.1 4.0 - 10.5 K/uL  RBC 3.06 (L) 4.22 - 5.81 MIL/uL  Hemoglobin 9.0 (L) 13.0 - 17.0 g/dL  HCT 27.8 (L) 39 - 52 %  MCV 90.8 80.0 -  100.0 fL  MCH 29.4 26.0 - 34.0 pg  MCHC 32.4 30.0 - 36.0 g/dL  RDW 17.3 (H) 11.5 - 15.5 %  Platelets 147 (L) 150 - 400 K/uL  nRBC 0.0 0.0 - 0.2 %  Neutrophils Relative % 66 %  Neutro Abs 4.7 1.7 - 7.7 K/uL  Lymphocytes Relative 22 %  Lymphs Abs 1.5 0.7 - 4.0 K/uL  Monocytes Relative 9 %  Monocytes Absolute 0.6 0.1 - 1.0 K/uL  Eosinophils Relative 3 %  Eosinophils Absolute 0.2 0.0 - 0.5 K/uL  Basophils Relative 0 %  Basophils Absolute 0.0 0.0 - 0.1 K/uL  Immature Granulocytes 0 %  Abs Immature Granulocytes 0.02 0.00 - 0.07 K/uL   Comment: Performed at [facility], [address] [place], [place] [address] Magnesium     Status: None  Collection Time: 05/24/20  6:17 AM Result Value Ref Range  Magnesium 2.2 1.7 - 2.4 mg/dL   Comment: Performed at [facility], [address] [place], [place] [address] Protime-INR     Status: None  Collection Time: 05/24/20  6:17 AM Result Value Ref Range  Prothrombin Time 13.2 11.4 - 15.2 seconds  INR 1.0 0.8 - 1.2   Comment: (NOTE) INR goal varies based on device and disease states. Performed at [facility], [address] [place], [place] [address]  APTT     Status: None  Collection Time: 05/24/20  6:17 AM Result Value Ref Range  aPTT 35 24 - 36 seconds   Comment: Performed at [facility], [address] [place], [place] [address] Type and screen     Status: None  Collection Time: 05/24/20  7:23 AM Result Value Ref Range  ABO/RH(D) AB POS   Antibody Screen NEG   Sample Expiration     05/27/2020,2359 Performed at [facility], [address] [place], [place] [address]  Glucose, capillary     Status: Abnormal  Collection Time: 05/24/20  7:34 AM Result Value Ref Range  Glucose-Capillary 141 (H) 70 - 99 mg/dL   Comment: Glucose reference range applies only to samples taken after fasting for at least 8 hours. Glucose, capillary     Status: Abnormal  Collection Time: 05/24/20 12:00 PM Result Value Ref Range  Glucose-Capillary 139 (H) 70 - 99 mg/dL   Comment: Glucose reference range applies only to samples taken after fasting for at least 8 hours. Glucose, capillary     Status: Abnormal  Collection Time: 05/24/20  3:48 PM Result Value Ref Range  Glucose-Capillary 119 (H) 70 - 99 mg/dL   Comment: Glucose reference range applies only to samples taken after fasting for at least 8 hours. Glucose, capillary     Status: Abnormal  Collection Time: 05/24/20  4:55 PM Result Value Ref Range  Glucose-Capillary 124 (H) 70 - 99 mg/dL   Comment: Glucose reference range applies only to samples taken after fasting for at least 8 hours. Glucose, capillary     Status: Abnormal  Collection Time: 05/24/20  8:36 PM Result Value Ref Range  Glucose-Capillary 144 (H) 70 - 99 mg/dL   Comment: Glucose reference range applies only to samples taken after fasting for at least 8 hours. Basic metabolic panel     Status: Abnormal  Collection Time: 05/25/20  6:33 AM Result Value Ref Range  Sodium 135 135 - 145 mmol/L  Potassium 3.7 3.5 - 5.1 mmol/L  Chloride 98 98 - 111 mmol/L  CO2 24 22 - 32 mmol/L  Glucose, Bld 232 (  H) 70 - 99 mg/dL   Comment: Glucose reference range applies only to samples taken after fasting for at least 8 hours.  BUN 82 (H) 8 - 23 mg/dL  Creatinine,  Ser 3.64 (H) 0.61 - 1.24 mg/dL  Calcium 8.3 (L) 8.9 - 10.3 mg/dL  GFR, Estimated 18 (L) >60 mL/min   Comment: (NOTE) Calculated using the CKD-EPI Creatinine Equation (2021)   Anion gap 13 5 - 15   Comment: Performed at [facility], [address] [place], [place] [address] Glucose, capillary     Status: Abnormal  Collection Time: 05/25/20  8:18 AM Result Value Ref Range  Glucose-Capillary 205 (H) 70 - 99 mg/dL   Comment: Glucose reference range applies only to samples taken after fasting for at least 8 hours. Glucose, capillary     Status: Abnormal  Collection Time: 05/25/20 11:39 AM Result Value Ref Range  Glucose-Capillary 145 (H) 70 - 99 mg/dL   Comment: Glucose reference range applies only to samples taken after fasting for at least 8 hours. Glucose, capillary     Status: Abnormal  Collection Time: 05/25/20  4:17 PM Result Value Ref Range  Glucose-Capillary 213 (H) 70 - 99 mg/dL   Comment: Glucose reference range applies only to samples taken after fasting for at least 8 hours. Glucose, capillary     Status: Abnormal  Collection Time: 05/25/20  8:38 PM Result Value Ref Range  Glucose-Capillary 130 (H) 70 - 99 mg/dL   Comment: Glucose reference range applies only to samples taken after fasting for at least 8 hours. Basic metabolic panel     Status: Abnormal  Collection Time: 05/26/20  6:29 AM Result Value Ref Range  Sodium 133 (L) 135 - 145 mmol/L  Potassium 3.5 3.5 - 5.1 mmol/L  Chloride 96 (L) 98 - 111 mmol/L  CO2 25 22 - 32 mmol/L  Glucose, Bld 161 (H) 70 - 99 mg/dL   Comment: Glucose reference range applies only to samples taken after fasting for at least 8 hours.  BUN 87 (H) 8 - 23 mg/dL  Creatinine, Ser 3.66 (H) 0.61 - 1.24 mg/dL  Calcium 8.2 (L) 8.9 - 10.3 mg/dL  GFR, Estimated 17 (L) >60 mL/min   Comment: (NOTE) Calculated using the CKD-EPI Creatinine Equation (2021)   Anion gap 12 5 - 15   Comment: Performed at [facility], [address], [place], [place] [address] Magnesium     Status: Abnormal  Collection Time: 05/26/20  6:29 AM Result Value Ref Range  Magnesium 2.5 (H) 1.7 - 2.4 mg/dL   Comment: Performed at [facility], [address] [place], [place] [address] Glucose, capillary     Status: Abnormal  Collection Time: 05/26/20  8:43 AM Result Value Ref Range  Glucose-Capillary 152 (H) 70 - 99 mg/dL   Comment: Glucose reference range applies only to samples taken after fasting for at least 8 hours. CBC     Status: Abnormal  Collection Time: 06/01/20  1:26 PM Result Value Ref Range  WBC 11.2 (H) 4.0 - 10.5 K/uL  RBC 2.88 (L) 4.22 - 5.81 Mil/uL  Platelets 246.0 150 - 400 K/uL  Hemoglobin 8.3 Repeated and verified X2. (L) 13.0 - 17.0 g/dL  HCT 25.6 Repeated and verified X2. (L) 39 - 52 %  MCV 88.8 78.0 - 100.0 fl  MCHC 32.5 30.0 - 36.0 g/dL  RDW 16.7 (H) 11.5 - 15.5 % Renal function panel     Status: Abnormal  Collection Time: 06/01/20  1:26 PM Result Value Ref Range  Sodium 132 (L) 135 - 145 mEq/L  Potassium 4.8 3.5 - 5.1 mEq/L  Chloride 92 (L) 96 - 112 mEq/L  CO2 28 19 - 32 mEq/L  Albumin 3.6 3.5 - 5.2 g/dL  BUN 87 (HH) 6 - 23 mg/dL  Creatinine, Ser 3.83 (H) 0.40 - 1.50 mg/dL  Glucose, Bld 163 (H) 70 - 99 mg/dL  Phosphorus 7.6 (H) 2.3 - 4.6 mg/dL  GFR 15.64 (L) >60.00 mL/min   Comment: Calculated using the CKD-EPI Creatinine Equation (2021)  Calcium 8.5 8.4 - 10.5 mg/dL CBC     Status: Abnormal  Collection Time: 06/02/20  7:14 PM Result Value Ref Range  WBC 9.1 4.0 - 10.5 K/uL  RBC 2.70 (L) 4.22 - 5.81 MIL/uL  Hemoglobin 7.9 (L) 13.0 - 17.0 g/dL  HCT 25.4 (L) 39 - 52 %  MCV 94.1 80.0 - 100.0 fL  MCH 29.3 26.0 - 34.0 pg  MCHC 31.1 30.0 - 36.0 g/dL  RDW 16.5 (H) 11.5 - 15.5 %  Platelets 225 150 - 400 K/uL  nRBC 0.0 0.0 - 0.2 %   Comment: Performed at [facility], [address] [place], [place]  [address] Comprehensive metabolic panel     Status: Abnormal  Collection Time:  06/02/20  7:14 PM Result Value Ref Range  Sodium 132 (L) 135 - 145 mmol/L  Potassium 3.6 3.5 - 5.1 mmol/L  Chloride 93 (L) 98 - 111 mmol/L  CO2 27 22 - 32 mmol/L  Glucose, Bld 208 (H) 70 - 99 mg/dL   Comment: Glucose reference range applies only to samples taken after fasting for at least 8 hours.  BUN 93 (H) 8 - 23 mg/dL  Creatinine, Ser 3.61 (H) 0.61 - 1.24 mg/dL  Calcium 8.2 (L) 8.9 - 10.3 mg/dL  Total Protein 7.9 6.5 - 8.1 g/dL  Albumin 3.3 (L) 3.5 - 5.0 g/dL  AST 21 15 - 41 U/L  ALT 20 0 - 44 U/L  Alkaline Phosphatase 80 38 - 126 U/L  Total Bilirubin 0.7 0.3 - 1.2 mg/dL  GFR, Estimated 18 (L) >60 mL/min   Comment: (NOTE) Calculated using the CKD-EPI Creatinine Equation (2021)   Anion gap 12 5 - 15   Comment: Performed at [facility], [address] [place], [place] [address]   Radiology CT CORONARY [facility] W/CTA COR W/SCORE W/CA W/CM &/OR WO/CM  Addendum Date: 05/10/2020   ADDENDUM REPORT: 05/10/2020 09:41 CLINICAL DATA:  64 year old male with h/o CAD, ischemic cardiomyopathy and aortic stenosis being evaluated for a TAVR procedure. EXAM: Cardiac TAVR CT TECHNIQUE: The patient was scanned on a [employer]. A 120 kV retrospective scan was triggered in the descending thoracic aorta at 111 HU's. Gantry rotation speed was 250 msecs and collimation was .6 mm. 12.5 mg of PO Carvedilol and no nitro were given. The 3D data set was reconstructed in 5% intervals of the R-R cycle. Systolic and diastolic phases were analyzed on a dedicated work station using MPR, MIP and VRT modes. The patient received 80 cc of contrast. FINDINGS: Aortic Valve: Severely calcified trileaflet aortic valve with severely restricted leaflet openings and no calcifications extending into the LVOT. Aortic vale calcium score is 2534 consistent with severe aortic stenosis. Aorta: There is mild ascending aortic aneurysm with maximum diameter 42.5 mm. There is diffuse atherosclerotic plaque and  calcifications without dissection. Sinotubular Junction: 32 x 32 mm Ascending Thoracic Aorta: 42.5 x 40.7 mm Aortic Arch: 32 x 32 mm Descending Thoracic  Aorta: 29 x 28 mm Sinus of Valsalva Measurements: Non-coronary: 37 mm Right -coronary: 35 mm Left -coronary: 35 mm Coronary Artery Height above Annulus: Left Main: 15 mm Right Coronary: 21 mm Virtual Basal Annulus Measurements: Maximum/Minimum Diameter: 30.9 x 25.1 mm Mean Diameter: 26.8 mm Perimeter: 86.1 mm Area: 563 mm2 Optimum Fluoroscopic Angle for Delivery: RAO 2 CAU 0 IMPRESSION: 1. Severely calcified trileaflet aortic valve with severely restricted leaflet openings and no calcifications extending into the LVOT. Aortic vale calcium score is 2534 consistent with severe aortic stenosis. Aortic measurements suitable for delivery of a 29 mm Edwards-SAPIEN 3 valve. 2. Sufficient coronary to annulus distance. 3. There is mild ascending aortic aneurysm with maximum diameter 42.5 mm. There is diffuse atherosclerotic plaque and calcifications without dissection. 4. Optimum Fluoroscopic Angle for Delivery:  RAO 2 CAU 0 5. No thrombus in the left atrial appendage. Electronically Signed   By: Ena Dawley   On: 05/10/2020 09:41   Result Date: 05/10/2020 EXAM: OVER-READ INTERPRETATION  CT CHEST The following report is an over-read performed by radiologist Dr. Vinnie Langton of [facility], [place] on 05/05/2020. This over-read does not include interpretation of cardiac or coronary anatomy or pathology. The coronary calcium score/coronary CTA interpretation by the cardiologist is attached. COMPARISON:  Chest CT 07/01/2019. FINDINGS: Extracardiac findings will be described separately under dictation for contemporaneously obtained CTA chest, abdomen and pelvis. IMPRESSION: Please see separate dictation for contemporaneously obtained CTA chest, abdomen and pelvis dated 05/05/2020 for full description of relevant extracardiac findings. Electronically Signed: By:  Vinnie Langton M.D. On: 05/05/2020 12:30   DG Chest Portable 1 View  Result Date: 05/20/2020 CLINICAL DATA:  Shortness of breath EXAM: PORTABLE CHEST 1 VIEW COMPARISON:  04/09/2020 FINDINGS: Post CABG changes. Stable cardiomediastinal contours. Pulmonary vascular congestion mild diffuse interstitial opacities throughout both lungs. Suspect trace bilateral pleural effusions. No pneumothorax. IMPRESSION: Findings suggestive of CHF with mild pulmonary edema. These results will be called to the ordering clinician or representative by the Radiologist Assistant, and communication documented in the PACS or [employer]. Electronically Signed   By: Davina Poke D.O.   On: 05/20/2020 08:19   [place] OR NERVE BLOCK-IMAGE ONLY [facility])  Result Date: 05/24/2020 There is no interpretation for this exam.  This order is for images obtained during a surgical procedure.  Please See "Surgeries" Tab for more information regarding the procedure.   [place] OR NERVE BLOCK-IMAGE ONLY [facility])  Result Date: 05/20/2020 There is no interpretation for this exam.  This order is for images obtained during a surgical procedure.  Please See "Surgeries" Tab for more information regarding the procedure.   CT ANGIO CHEST AORTA W/CM & OR WO/CM  Addendum Date: 05/07/2020   ADDENDUM REPORT: 05/07/2020 12:52 CLINICAL DATA:  64 year old male with h/o CAD, ischemic cardiomyopathy and aortic stenosis being evaluated for a TAVR procedure. EXAM: Cardiac TAVR CT TECHNIQUE: The patient was scanned on a [employer]. A 120 kV retrospective scan was triggered in the descending thoracic aorta at 111 HU's. Gantry rotation speed was 250 msecs and collimation was .6 mm. 12.5 mg of PO Carvedilol and no nitro were given. The 3D data set was reconstructed in 5% intervals of the R-R cycle. Systolic and diastolic phases were analyzed on a dedicated work station using MPR, MIP and VRT modes. The patient received 80 cc of contrast. FINDINGS: Aortic  Valve: Severely calcified trileaflet aortic valve with severely restricted leaflet openings and no calcifications extending into the LVOT. Aortic vale calcium score  is 2534 consistent with severe aortic stenosis. Aorta: There is mild ascending aortic aneurysm with maximum diameter 42.5 mm. There is diffuse atherosclerotic plaque and calcifications without dissection. Sinotubular Junction: 32 x 32 mm Ascending Thoracic Aorta: 42.5 x 40.7 mm Aortic Arch: 32 x 32 mm Descending Thoracic Aorta: 29 x 28 mm Sinus of Valsalva Measurements: Non-coronary: 37 mm Right -coronary: 35 mm Left -coronary: 35 mm Coronary Artery Height above Annulus: Left Main: 15 mm Right Coronary: 21 mm Virtual Basal Annulus Measurements: Maximum/Minimum Diameter: 30.9 x 25.1 mm Mean Diameter: 26.8 mm Perimeter: 86.1 mm Area: 563 mm2 Optimum Fluoroscopic Angle for Delivery: RAO 2 CAU 0 IMPRESSION: 1. Severely calcified trileaflet aortic valve with severely restricted leaflet openings and no calcifications extending into the LVOT. Aortic vale calcium score is 2534 consistent with severe aortic stenosis. Aortic measurements suitable for delivery of a 29 mm Edwards-SAPIEN 3 valve. 2. Sufficient coronary to annulus distance. 3. There is mild ascending aortic aneurysm with maximum diameter 42.5 mm. There is diffuse atherosclerotic plaque and calcifications without dissection. 4. Optimum Fluoroscopic Angle for Delivery:  RAO 2 CAU 0 5. No thrombus in the left atrial appendage. Electronically Signed   By: Ena Dawley   On: 05/07/2020 12:52   Result Date: 05/07/2020 CLINICAL DATA:  64 year old male with history of severe aortic stenosis. Preprocedural study prior to potential transcatheter aortic valve replacement (TAVR) procedure. EXAM: CT ANGIOGRAPHY CHEST, ABDOMEN AND PELVIS TECHNIQUE: Multidetector CT imaging through the chest, abdomen and pelvis was performed using the standard protocol during bolus administration of intravenous contrast.  Multiplanar reconstructed images and MIPs were obtained and reviewed to evaluate the vascular anatomy. CONTRAST:  50mL OMNIPAQUE IOHEXOL 350 MG/ML SOLN COMPARISON:  Chest CT 07/01/2019. FINDINGS: CTA CHEST FINDINGS Cardiovascular: Heart size is mildly enlarged. There is no significant pericardial fluid, thickening or pericardial calcification. There is aortic atherosclerosis, as well as atherosclerosis of the great vessels of the mediastinum and the coronary arteries, including calcified atherosclerotic plaque in the left main, left anterior descending, left circumflex and right coronary arteries. Status post median sternotomy for CABG including LIMA to the LAD. Severe thickening calcification of the aortic valve. Mediastinum/Lymph Nodes: No pathologically enlarged mediastinal or hilar lymph nodes. Esophagus is unremarkable in appearance. No axillary lymphadenopathy. Lungs/Pleura: Small bilateral pleural effusions lying dependently. Widespread areas of ground-glass attenuation and interlobular septal thickening noted throughout the lungs bilaterally, suggesting a background of very mild interstitial pulmonary edema. No acute consolidative airspace disease. Musculoskeletal/Soft Tissues: Median sternotomy wires. Old healed fracture of the lateral aspect of the right sixth rib incidentally noted. There are no aggressive appearing lytic or blastic lesions noted in the visualized portions of the skeleton. CTA ABDOMEN AND PELVIS FINDINGS Hepatobiliary: Liver has a shrunken appearance and nodular contour, indicative of underlying cirrhosis. Numerous tiny calcified granulomas are scattered throughout the hepatic parenchyma. No suspicious appearing hepatic lesions. No intra or extrahepatic biliary ductal dilatation. Tiny calcified gallstone lying dependently in the gallbladder. No findings to suggest an acute cholecystitis at this time. Pancreas: No pancreatic mass. No pancreatic ductal dilatation. No pancreatic or  peripancreatic fluid collections or inflammatory changes. Spleen: Small calcified granulomas in the spleen. Adrenals/Urinary Tract: Atrophy of the kidneys bilaterally, where there are innumerable low-attenuation lesions, most of which are subcentimeter in size and too small to definitively characterize, but favored to represent cysts. The largest of these are compatible with simple cysts, measuring up to 3.4 cm in the lower pole of the left kidney. Multiple nonobstructive calculi are noted  within the collecting systems of both kidneys measuring up to 1.3 cm in diameter in the lower pole collecting system of the left kidney. No hydroureteronephrosis. Urinary bladder is unremarkable in appearance. Bilateral adrenal glands are normal in appearance. Stomach/Bowel: Normal appearance of the stomach. No pathologic dilatation of small bowel or colon. Several colonic diverticulae are noted, particularly in the descending colon and sigmoid colon, without surrounding inflammatory changes to suggest an acute diverticulitis at this time. Normal appendix. Vascular/Lymphatic: Aortic atherosclerosis, without evidence of aneurysm or dissection in the abdominal or pelvic vasculature. Vascular findings and measurements pertinent to potential TAVR procedure, as detailed below. No lymphadenopathy noted in the abdomen or pelvis. Reproductive: Prostate gland and seminal vesicles are unremarkable in appearance. Other: No significant volume of ascites.  No pneumoperitoneum. Musculoskeletal: Compression fracture of superior endplate of L2 with 50% loss of anterior vertebral body height. There are no aggressive appearing lytic or blastic lesions noted in the visualized portions of the skeleton. VASCULAR MEASUREMENTS PERTINENT TO TAVR: AORTA: Minimal Aortic Diameter-11 x 6 mm Severity of Aortic Calcification-severe RIGHT PELVIS: Right Common Iliac Artery - Minimal Diameter-4.0 x 3.4 mm Tortuosity-mild Calcification-severe Right External Iliac  Artery - Minimal Diameter-2.9 x 2.9 mm Tortuosity - mild Calcification - severe Right Common Femoral Artery - Minimal Diameter-4.2 x 3.3 mm Tortuosity - mild Calcification - severe LEFT PELVIS: Left Common Iliac Artery - Minimal Diameter-2.4 x 1.5 mm Tortuosity - mild Calcification - severe Left External Iliac Artery - Minimal Diameter - Patent lumen not confidently identified Tortuosity - mild Calcification - severe Left Common Femoral Artery - Minimal Diameter-4.0 x 3.5 mm Tortuosity - mild Calcification - severe Review of the MIP images confirms the above findings. IMPRESSION: 1. Vascular findings and measurements pertinent to potential TAVR procedure, as detailed above. 2. Severe thickening calcification of the aortic valve, compatible with reported clinical history of severe aortic stenosis. 3. Aortic atherosclerosis, in addition to left main and 3 vessel coronary artery disease. Status post median sternotomy for CABG including LIMA to the LAD. 4. Cardiomegaly with small bilateral pleural effusions and evidence of interstitial pulmonary edema in the lungs; imaging findings concerning for congestive heart failure. 5. Morphologic changes in the liver suggestive of underlying cirrhosis. 6. Cholelithiasis without evidence of acute cholecystitis at this time. 7. Severe atrophy of the kidneys bilaterally with multiple small lesions, likely to represent innumerable cysts. 8. Colonic diverticulosis without evidence of acute diverticulitis at this time. 9. Additional incidental findings, as above. Electronically Signed: By: Vinnie Langton M.D. On: 05/05/2020 14:17   CT ANGIO ABDOMEN PELVIS  W &/OR WO CONTRAST  Addendum Date: 05/07/2020   ADDENDUM REPORT: 05/07/2020 12:52 CLINICAL DATA:  64 year old male with h/o CAD, ischemic cardiomyopathy and aortic stenosis being evaluated for a TAVR procedure. EXAM: Cardiac TAVR CT TECHNIQUE: The patient was scanned on a [employer]. A 120 kV retrospective scan was  triggered in the descending thoracic aorta at 111 HU's. Gantry rotation speed was 250 msecs and collimation was .6 mm. 12.5 mg of PO Carvedilol and no nitro were given. The 3D data set was reconstructed in 5% intervals of the R-R cycle. Systolic and diastolic phases were analyzed on a dedicated work station using MPR, MIP and VRT modes. The patient received 80 cc of contrast. FINDINGS: Aortic Valve: Severely calcified trileaflet aortic valve with severely restricted leaflet openings and no calcifications extending into the LVOT. Aortic vale calcium score is 2534 consistent with severe aortic stenosis. Aorta: There is mild ascending aortic aneurysm  with maximum diameter 42.5 mm. There is diffuse atherosclerotic plaque and calcifications without dissection. Sinotubular Junction: 32 x 32 mm Ascending Thoracic Aorta: 42.5 x 40.7 mm Aortic Arch: 32 x 32 mm Descending Thoracic Aorta: 29 x 28 mm Sinus of Valsalva Measurements: Non-coronary: 37 mm Right -coronary: 35 mm Left -coronary: 35 mm Coronary Artery Height above Annulus: Left Main: 15 mm Right Coronary: 21 mm Virtual Basal Annulus Measurements: Maximum/Minimum Diameter: 30.9 x 25.1 mm Mean Diameter: 26.8 mm Perimeter: 86.1 mm Area: 563 mm2 Optimum Fluoroscopic Angle for Delivery: RAO 2 CAU 0 IMPRESSION: 1. Severely calcified trileaflet aortic valve with severely restricted leaflet openings and no calcifications extending into the LVOT. Aortic vale calcium score is 2534 consistent with severe aortic stenosis. Aortic measurements suitable for delivery of a 29 mm Edwards-SAPIEN 3 valve. 2. Sufficient coronary to annulus distance. 3. There is mild ascending aortic aneurysm with maximum diameter 42.5 mm. There is diffuse atherosclerotic plaque and calcifications without dissection. 4. Optimum Fluoroscopic Angle for Delivery:  RAO 2 CAU 0 5. No thrombus in the left atrial appendage. Electronically Signed   By: Ena Dawley   On: 05/07/2020 12:52   Result Date:  05/07/2020 CLINICAL DATA:  64 year old male with history of severe aortic stenosis. Preprocedural study prior to potential transcatheter aortic valve replacement (TAVR) procedure. EXAM: CT ANGIOGRAPHY CHEST, ABDOMEN AND PELVIS TECHNIQUE: Multidetector CT imaging through the chest, abdomen and pelvis was performed using the standard protocol during bolus administration of intravenous contrast. Multiplanar reconstructed images and MIPs were obtained and reviewed to evaluate the vascular anatomy. CONTRAST:  56mL OMNIPAQUE IOHEXOL 350 MG/ML SOLN COMPARISON:  Chest CT 07/01/2019. FINDINGS: CTA CHEST FINDINGS Cardiovascular: Heart size is mildly enlarged. There is no significant pericardial fluid, thickening or pericardial calcification. There is aortic atherosclerosis, as well as atherosclerosis of the great vessels of the mediastinum and the coronary arteries, including calcified atherosclerotic plaque in the left main, left anterior descending, left circumflex and right coronary arteries. Status post median sternotomy for CABG including LIMA to the LAD. Severe thickening calcification of the aortic valve. Mediastinum/Lymph Nodes: No pathologically enlarged mediastinal or hilar lymph nodes. Esophagus is unremarkable in appearance. No axillary lymphadenopathy. Lungs/Pleura: Small bilateral pleural effusions lying dependently. Widespread areas of ground-glass attenuation and interlobular septal thickening noted throughout the lungs bilaterally, suggesting a background of very mild interstitial pulmonary edema. No acute consolidative airspace disease. Musculoskeletal/Soft Tissues: Median sternotomy wires. Old healed fracture of the lateral aspect of the right sixth rib incidentally noted. There are no aggressive appearing lytic or blastic lesions noted in the visualized portions of the skeleton. CTA ABDOMEN AND PELVIS FINDINGS Hepatobiliary: Liver has a shrunken appearance and nodular contour, indicative of underlying  cirrhosis. Numerous tiny calcified granulomas are scattered throughout the hepatic parenchyma. No suspicious appearing hepatic lesions. No intra or extrahepatic biliary ductal dilatation. Tiny calcified gallstone lying dependently in the gallbladder. No findings to suggest an acute cholecystitis at this time. Pancreas: No pancreatic mass. No pancreatic ductal dilatation. No pancreatic or peripancreatic fluid collections or inflammatory changes. Spleen: Small calcified granulomas in the spleen. Adrenals/Urinary Tract: Atrophy of the kidneys bilaterally, where there are innumerable low-attenuation lesions, most of which are subcentimeter in size and too small to definitively characterize, but favored to represent cysts. The largest of these are compatible with simple cysts, measuring up to 3.4 cm in the lower pole of the left kidney. Multiple nonobstructive calculi are noted within the collecting systems of both kidneys measuring up to 1.3 cm in diameter  in the lower pole collecting system of the left kidney. No hydroureteronephrosis. Urinary bladder is unremarkable in appearance. Bilateral adrenal glands are normal in appearance. Stomach/Bowel: Normal appearance of the stomach. No pathologic dilatation of small bowel or colon. Several colonic diverticulae are noted, particularly in the descending colon and sigmoid colon, without surrounding inflammatory changes to suggest an acute diverticulitis at this time. Normal appendix. Vascular/Lymphatic: Aortic atherosclerosis, without evidence of aneurysm or dissection in the abdominal or pelvic vasculature. Vascular findings and measurements pertinent to potential TAVR procedure, as detailed below. No lymphadenopathy noted in the abdomen or pelvis. Reproductive: Prostate gland and seminal vesicles are unremarkable in appearance. Other: No significant volume of ascites.  No pneumoperitoneum. Musculoskeletal: Compression fracture of superior endplate of L2 with 71% loss of  anterior vertebral body height. There are no aggressive appearing lytic or blastic lesions noted in the visualized portions of the skeleton. VASCULAR MEASUREMENTS PERTINENT TO TAVR: AORTA: Minimal Aortic Diameter-11 x 6 mm Severity of Aortic Calcification-severe RIGHT PELVIS: Right Common Iliac Artery - Minimal Diameter-4.0 x 3.4 mm Tortuosity-mild Calcification-severe Right External Iliac Artery - Minimal Diameter-2.9 x 2.9 mm Tortuosity - mild Calcification - severe Right Common Femoral Artery - Minimal Diameter-4.2 x 3.3 mm Tortuosity - mild Calcification - severe LEFT PELVIS: Left Common Iliac Artery - Minimal Diameter-2.4 x 1.5 mm Tortuosity - mild Calcification - severe Left External Iliac Artery - Minimal Diameter - Patent lumen not confidently identified Tortuosity - mild Calcification - severe Left Common Femoral Artery - Minimal Diameter-4.0 x 3.5 mm Tortuosity - mild Calcification - severe Review of the MIP images confirms the above findings. IMPRESSION: 1. Vascular findings and measurements pertinent to potential TAVR procedure, as detailed above. 2. Severe thickening calcification of the aortic valve, compatible with reported clinical history of severe aortic stenosis. 3. Aortic atherosclerosis, in addition to left main and 3 vessel coronary artery disease. Status post median sternotomy for CABG including LIMA to the LAD. 4. Cardiomegaly with small bilateral pleural effusions and evidence of interstitial pulmonary edema in the lungs; imaging findings concerning for congestive heart failure. 5. Morphologic changes in the liver suggestive of underlying cirrhosis. 6. Cholelithiasis without evidence of acute cholecystitis at this time. 7. Severe atrophy of the kidneys bilaterally with multiple small lesions, likely to represent innumerable cysts. 8. Colonic diverticulosis without evidence of acute diverticulitis at this time. 9. Additional incidental findings, as above. Electronically Signed: By: Vinnie Langton M.D. On: 05/05/2020 14:17    Assessment/Plan Steal syndrome left hand appears present with pain and numbness beyond the access.  Already had a radial artery harvest on that side.  We will try to do an angiogram to see if the fistula might be salvageable with distal intervention on the brachial or ulnar artery.  If that is not possible, the fistula will need to be ligated. CKD stage IV/V.  Had a fistula placed for this reason.  Not yet on dialysis PAD.  Multiple previous interventions.  Has significant disease.    Leotis Pain, MD  06/03/2020 8:29 AM    This note was created with Dragon medical transcription system.  Any errors from dictation are purely unintentional

## 2020-06-06 ENCOUNTER — Other Ambulatory Visit (INDEPENDENT_AMBULATORY_CARE_PROVIDER_SITE_OTHER): Payer: Self-pay | Admitting: Nurse Practitioner

## 2020-06-06 ENCOUNTER — Ambulatory Visit: Payer: Medicare Other | Admitting: Registered Nurse

## 2020-06-06 ENCOUNTER — Encounter
Admission: RE | Disposition: A | Discharge status: Home / Self Care | Payer: Self-pay | Source: Home / Self Care | Attending: Vascular Surgery

## 2020-06-06 ENCOUNTER — Other Ambulatory Visit: Payer: Self-pay

## 2020-06-06 ENCOUNTER — Encounter: Payer: Self-pay | Admitting: Vascular Surgery

## 2020-06-06 ENCOUNTER — Ambulatory Visit
Admission: RE | Admit: 2020-06-06 | Discharge: 2020-06-06 | Disposition: A | Discharge status: Home / Self Care | Payer: Medicare Other | Attending: Vascular Surgery | Admitting: Vascular Surgery

## 2020-06-06 DIAGNOSIS — E1151 Type 2 diabetes mellitus with diabetic peripheral angiopathy without gangrene: Secondary | ICD-10-CM | POA: Diagnosis not present

## 2020-06-06 DIAGNOSIS — Z992 Dependence on renal dialysis: Secondary | ICD-10-CM | POA: Diagnosis not present

## 2020-06-06 DIAGNOSIS — I5043 Acute on chronic combined systolic (congestive) and diastolic (congestive) heart failure: Secondary | ICD-10-CM | POA: Diagnosis not present

## 2020-06-06 DIAGNOSIS — T82898A Other specified complication of vascular prosthetic devices, implants and grafts, initial encounter: Secondary | ICD-10-CM | POA: Diagnosis not present

## 2020-06-06 DIAGNOSIS — Z794 Long term (current) use of insulin: Secondary | ICD-10-CM | POA: Diagnosis not present

## 2020-06-06 DIAGNOSIS — Y832 Surgical operation with anastomosis, bypass or graft as the cause of abnormal reaction of the patient, or of later complication, without mention of misadventure at the time of the procedure: Secondary | ICD-10-CM | POA: Insufficient documentation

## 2020-06-06 DIAGNOSIS — Z888 Allergy status to other drugs, medicaments and biological substances status: Secondary | ICD-10-CM | POA: Diagnosis not present

## 2020-06-06 DIAGNOSIS — Z79899 Other long term (current) drug therapy: Secondary | ICD-10-CM | POA: Insufficient documentation

## 2020-06-06 DIAGNOSIS — N186 End stage renal disease: Secondary | ICD-10-CM | POA: Diagnosis not present

## 2020-06-06 DIAGNOSIS — Z91041 Radiographic dye allergy status: Secondary | ICD-10-CM | POA: Diagnosis not present

## 2020-06-06 DIAGNOSIS — N185 Chronic kidney disease, stage 5: Secondary | ICD-10-CM | POA: Diagnosis not present

## 2020-06-06 DIAGNOSIS — G4733 Obstructive sleep apnea (adult) (pediatric): Secondary | ICD-10-CM | POA: Diagnosis not present

## 2020-06-06 DIAGNOSIS — I132 Hypertensive heart and chronic kidney disease with heart failure and with stage 5 chronic kidney disease, or end stage renal disease: Secondary | ICD-10-CM | POA: Diagnosis not present

## 2020-06-06 DIAGNOSIS — Z880 Allergy status to penicillin: Secondary | ICD-10-CM | POA: Insufficient documentation

## 2020-06-06 DIAGNOSIS — I7789 Other specified disorders of arteries and arterioles: Secondary | ICD-10-CM | POA: Diagnosis not present

## 2020-06-06 DIAGNOSIS — E1122 Type 2 diabetes mellitus with diabetic chronic kidney disease: Secondary | ICD-10-CM | POA: Insufficient documentation

## 2020-06-06 DIAGNOSIS — D631 Anemia in chronic kidney disease: Secondary | ICD-10-CM | POA: Diagnosis not present

## 2020-06-06 DIAGNOSIS — E785 Hyperlipidemia, unspecified: Secondary | ICD-10-CM | POA: Diagnosis not present

## 2020-06-06 HISTORY — PX: UPPER EXTREMITY ANGIOGRAPHY: CATH118270

## 2020-06-06 LAB — GLUCOSE, CAPILLARY: Glucose-Capillary: 161 mg/dL — ABNORMAL HIGH (ref 70–99)

## 2020-06-06 SURGERY — UPPER EXTREMITY ANGIOGRAPHY
Anesthesia: General | Laterality: Left

## 2020-06-06 MED ORDER — HEPARIN SODIUM (PORCINE) 1000 UNIT/ML IJ SOLN
INTRAMUSCULAR | Status: AC
  Filled 2020-06-06: qty 1

## 2020-06-06 MED ORDER — PROPOFOL 10 MG/ML IV BOLUS
INTRAVENOUS | Status: AC
  Filled 2020-06-06: qty 20

## 2020-06-06 MED ORDER — FENTANYL CITRATE (PF) 100 MCG/2ML IJ SOLN
INTRAMUSCULAR | Status: DC | PRN
  Administered 2020-06-06 (×8): 12.5 ug via INTRAVENOUS

## 2020-06-06 MED ORDER — LIDOCAINE HCL (CARDIAC) PF 100 MG/5ML IV SOSY
PREFILLED_SYRINGE | INTRAVENOUS | Status: DC | PRN
  Administered 2020-06-06: 40 mg via INTRAVENOUS

## 2020-06-06 MED ORDER — METHYLPREDNISOLONE SODIUM SUCC 125 MG IJ SOLR: 125.0000 mg | Freq: Once | INTRAMUSCULAR | Status: AC | PRN

## 2020-06-06 MED ORDER — SODIUM CHLORIDE 0.9 % IV SOLN: INTRAVENOUS | Status: DC

## 2020-06-06 MED ORDER — HEPARIN SODIUM (PORCINE) 1000 UNIT/ML IJ SOLN
INTRAMUSCULAR | Status: DC | PRN
  Administered 2020-06-06: 4000 [IU] via INTRAVENOUS

## 2020-06-06 MED ORDER — MIDAZOLAM HCL 2 MG/2ML IJ SOLN
INTRAMUSCULAR | Status: DC | PRN
  Administered 2020-06-06: 2 mg via INTRAVENOUS

## 2020-06-06 MED ORDER — ASPIRIN EC 81 MG PO TBEC: 81.0000 mg | DELAYED_RELEASE_TABLET | Freq: Every day | ORAL | 2 refills | Status: DC

## 2020-06-06 MED ORDER — ONDANSETRON HCL 4 MG/2ML IJ SOLN
INTRAMUSCULAR | Status: DC | PRN
  Administered 2020-06-06: 4 mg via INTRAVENOUS

## 2020-06-06 MED ORDER — MIDAZOLAM HCL 2 MG/ML PO SYRP: 8.0000 mg | ORAL_SOLUTION | Freq: Once | ORAL | Status: DC | PRN

## 2020-06-06 MED ORDER — HYDROMORPHONE HCL 1 MG/ML IJ SOLN: 1.0000 mg | Freq: Once | INTRAMUSCULAR | Status: DC | PRN

## 2020-06-06 MED ORDER — DIPHENHYDRAMINE HCL 50 MG/ML IJ SOLN
INTRAMUSCULAR | Status: AC
  Administered 2020-06-06: 50 mg via INTRAVENOUS
  Filled 2020-06-06: qty 1

## 2020-06-06 MED ORDER — MIDAZOLAM HCL 2 MG/2ML IJ SOLN
INTRAMUSCULAR | Status: AC
  Filled 2020-06-06: qty 2

## 2020-06-06 MED ORDER — FAMOTIDINE 20 MG PO TABS
ORAL_TABLET | ORAL | Status: AC
  Administered 2020-06-06: 40 mg via ORAL
  Filled 2020-06-06: qty 2

## 2020-06-06 MED ORDER — FENTANYL CITRATE (PF) 100 MCG/2ML IJ SOLN
INTRAMUSCULAR | Status: AC
  Filled 2020-06-06: qty 2

## 2020-06-06 MED ORDER — FENTANYL CITRATE (PF) 100 MCG/2ML IJ SOLN
25.0000 ug | INTRAMUSCULAR | Status: DC | PRN
  Administered 2020-06-06: 25 ug via INTRAVENOUS

## 2020-06-06 MED ORDER — CLINDAMYCIN PHOSPHATE 300 MG/50ML IV SOLN
INTRAVENOUS | Status: AC
  Filled 2020-06-06: qty 50

## 2020-06-06 MED ORDER — FAMOTIDINE 20 MG PO TABS: 40.0000 mg | ORAL_TABLET | Freq: Once | ORAL | Status: AC | PRN

## 2020-06-06 MED ORDER — PHENYLEPHRINE HCL (PRESSORS) 10 MG/ML IV SOLN
INTRAVENOUS | Status: DC | PRN
  Administered 2020-06-06: 100 ug via INTRAVENOUS

## 2020-06-06 MED ORDER — ONDANSETRON HCL 4 MG/2ML IJ SOLN
INTRAMUSCULAR | Status: AC
  Filled 2020-06-06: qty 2

## 2020-06-06 MED ORDER — ONDANSETRON HCL 4 MG/2ML IJ SOLN
4.0000 mg | Freq: Four times a day (QID) | INTRAMUSCULAR | Status: DC | PRN
  Administered 2020-06-06: 4 mg via INTRAVENOUS

## 2020-06-06 MED ORDER — ONDANSETRON HCL 4 MG/2ML IJ SOLN: 4.0000 mg | Freq: Once | INTRAMUSCULAR | Status: DC | PRN

## 2020-06-06 MED ORDER — METHYLPREDNISOLONE SODIUM SUCC 125 MG IJ SOLR
INTRAMUSCULAR | Status: AC
  Administered 2020-06-06: 125 mg via INTRAVENOUS
  Filled 2020-06-06: qty 2

## 2020-06-06 MED ORDER — CLINDAMYCIN PHOSPHATE 300 MG/50ML IV SOLN
300.0000 mg | Freq: Once | INTRAVENOUS | Status: AC
  Administered 2020-06-06: 300 mg via INTRAVENOUS

## 2020-06-06 MED ORDER — DIPHENHYDRAMINE HCL 50 MG/ML IJ SOLN: 50.0000 mg | Freq: Once | INTRAMUSCULAR | Status: AC | PRN

## 2020-06-06 MED ORDER — PROPOFOL 500 MG/50ML IV EMUL
INTRAVENOUS | Status: DC | PRN
  Administered 2020-06-06: 50 ug/kg/min via INTRAVENOUS

## 2020-06-06 MED ORDER — PROPOFOL 500 MG/50ML IV EMUL
INTRAVENOUS | Status: AC
  Filled 2020-06-06: qty 50

## 2020-06-06 MED ORDER — IODIXANOL 320 MG/ML IV SOLN
INTRAVENOUS | Status: DC | PRN
  Administered 2020-06-06: 80 mL via INTRA_ARTERIAL

## 2020-06-06 SURGICAL SUPPLY — 26 items
BALLN LUTONIX 5X120X130 (BALLOONS) ×3
BALLN LUTONIX 5X150X130 (BALLOONS) ×3
BALLN LUTONIX DCB 4X100X130 (BALLOONS) ×3
BALLN LUTONIX DCB 7X40X130 (BALLOONS) ×3
BALLOON LUTONIX 5X120X130 (BALLOONS) ×1 IMPLANT
BALLOON LUTONIX 5X150X130 (BALLOONS) ×1 IMPLANT
BALLOON LUTONIX DCB 4X100X130 (BALLOONS) ×1 IMPLANT
BALLOON LUTONIX DCB 7X40X130 (BALLOONS) ×1 IMPLANT
CATH ANGIO 5F PIGTAIL 100CM (CATHETERS) ×3 IMPLANT
CATH BEACON 5 .035 100 H1 TIP (CATHETERS) ×3 IMPLANT
COVER PROBE U/S 5X48 (MISCELLANEOUS) ×3 IMPLANT
DEVICE STARCLOSE SE CLOSURE (Vascular Products) ×3 IMPLANT
DEVICE TORQUE .025-.038 (MISCELLANEOUS) ×3 IMPLANT
GLIDECATH ANGLED 4FR 120CM (CATHETERS) ×3 IMPLANT
GLIDEWIRE ANGLED SS 035X260CM (WIRE) ×3 IMPLANT
KIT ENCORE 26 ADVANTAGE (KITS) ×3 IMPLANT
PACK ANGIOGRAPHY (CUSTOM PROCEDURE TRAY) ×3 IMPLANT
SHEATH BRITE TIP 5FRX11 (SHEATH) ×3 IMPLANT
SHEATH SHUTTLE 6FRX80 (SHEATH) ×3 IMPLANT
STENT LIFESTENT 5F 6X100X135 (Permanent Stent) ×3 IMPLANT
SYR MEDRAD MARK 7 150ML (SYRINGE) ×3 IMPLANT
TUBING CONTRAST HIGH PRESS 72 (TUBING) ×3 IMPLANT
VALVE CHECKFLO PERFORMER (SHEATH) ×3 IMPLANT
WIRE AMPLATZ SSTIFF .035X260CM (WIRE) ×3 IMPLANT
WIRE J 3MM .035X145CM (WIRE) ×3 IMPLANT
WIRE MAGIC TORQUE 260C (WIRE) ×6 IMPLANT

## 2020-06-06 NOTE — OR Nursing (Signed)
Pt left cane, clothes and dentures in specials recovery bay 5

## 2020-06-06 NOTE — Transfer of Care (Signed)
Immediate Anesthesia Transfer of Care Note  Patient: [name] [name]  Procedure(s) Performed: UPPER EXTREMITY ANGIOGRAPHY (Left )  Patient Location: PACU  Anesthesia Type:General  Level of Consciousness: drowsy  Airway & Oxygen Therapy: Patient Spontanous Breathing and Patient connected to face mask oxygen  Post-op Assessment: Report given to RN and Post -op Vital signs reviewed and stable  Post vital signs: Reviewed and stable  Last Vitals:  Vitals Value Taken Time  BP 104/85 06/06/20 1552  Temp    Pulse 97 06/06/20 1556  Resp 20 06/06/20 1556  SpO2 96 % 06/06/20 1556  Vitals shown include unvalidated device data.  Last Pain:  Vitals:   06/06/20 1552  TempSrc:   PainSc: (P) Asleep         Complications: No complications documented.

## 2020-06-06 NOTE — Anesthesia Preprocedure Evaluation (Addendum)
Anesthesia Evaluation  Patient identified by MRN, date of birth, ID band Patient awake    Reviewed: Allergy & Precautions, H&P , NPO status , Patient's Chart, lab work & pertinent test results, reviewed documented beta blocker date and time   Airway Mallampati: II   Neck ROM: full    Dental  (+) Poor Dentition   Pulmonary neg pulmonary ROS, shortness of breath and with exertion, asthma , sleep apnea , COPD, former smoker,    Pulmonary exam normal        Cardiovascular hypertension, + angina with exertion + CAD, + Past MI, + Peripheral Vascular Disease and +CHF  negative cardio ROS Normal cardiovascular exam+ Valvular Problems/Murmurs  Rhythm:regular Rate:Normal     Neuro/Psych  Headaches, Anxiety Depression Bipolar Disorder  Neuromuscular disease CVA negative neurological ROS  negative psych ROS   GI/Hepatic negative GI ROS, Neg liver ROS, GERD  Medicated,  Endo/Other  negative endocrine ROSdiabetes  Renal/GU Renal diseasenegative Renal ROS  negative genitourinary   Musculoskeletal   Abdominal   Peds  Hematology negative hematology ROS (+) Blood dyscrasia, anemia ,   Anesthesia Other Findings Past Medical History: No date: 3-vessel CAD     Comment:  8/30 LHC with radial graft to RCA occluded at the ostium              with critical stenosis of the ostial native RCA. No date: Adenomatous colon polyp No date: Allergy No date: Anemia No date: Anxiety No date: Arthritis     Comment:  RA No date: Asthma 2007: Bell's palsy     Comment:  neuropathy No date: CAD (coronary artery disease)     Comment:  03/21/2020 LHC with patent LIMA to LAD and SVG to OM 3.                Radial graft to RCA occluded at the ostium with critical               stenosis of the ostial and native RCA. No date: Carotid artery occlusion     Comment:  Bilateral carotid stenosis s/p left-sided CEA. No date: Cataract     Comment:  Dr.  Dawna Part No date: CKD (chronic kidney disease), stage IV ([place])     Comment:  03/2020 vascular surgery access planned No date: Depression No date: Diabetes mellitus No date: Diverticulosis No date: Duodenitis No date: DVT (deep venous thrombosis) ([facility])     Comment:  in leg No date: Dyspnea     Comment:  with edema No date: ESRD (end stage renal disease) ([place]) No date: Gastropathy No date: GERD (gastroesophageal reflux disease) No date: Heart failure with preserved ejection fraction ([place])     Comment:  8/30 RHC with moderately to severely elevated filling               pressures and pulmonary wedge pressure 31 mmHg, moderate               pulmonary hypertension No date: Heart murmur No date: Helicobacter pylori gastritis No date: Hx of CABG     Comment:  2012 No date: Hyperlipidemia No date: Hypertension No date: Mitral regurgitation and aortic stenosis     Comment:  RHC 8/30 and echo 12/2019. 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 No date: Sleep apnea     Comment:  uses cpap No date: Stroke [facility]) No date: Tobacco use disorder     Comment:  recently quit 08/2013: Tubular adenoma     Comment:  Dr. Hilarie Fredrickson No date: Weak urinary stream Past Surgical History: 05/24/2020: AV FISTULA PLACEMENT; N/A     Comment:  Procedure: ARTERIOVENOUS (AV) FISTULA CREATION               (Brachio-cephalic);  Surgeon: Algernon Huxley, MD;                Location: [facility] ORS;  Service: Vascular;  Laterality: N/A; 08/2010: CARDIAC CATHETERIZATION     Comment:  LAD: 80% ISR, RCA: 80% ostial, OM 80-90% 08/2010: CAROTID ENDARTERECTOMY     Comment:  left/ Dr.  Kellie Simmering 07/09/2017: CATARACT EXTRACTION W/PHACO; Left     Comment:  Procedure: CATARACT EXTRACTION PHACO AND INTRAOCULAR               LENS PLACEMENT (IOC);  Surgeon: Birder Robson, MD;                Location: [facility] ORS;  Service: Ophthalmology;  Laterality:              Left;  [place] 00:23 AP% 14.2 CDE 3.26 Fluid pack lot #               [id number] 09/10/2017: CATARACT EXTRACTION W/PHACO; Right     Comment:  Procedure: CATARACT EXTRACTION PHACO AND INTRAOCULAR               LENS PLACEMENT (IOC);  Surgeon: Birder Robson, MD;                Location: [facility] ORS;  Service: Ophthalmology;  Laterality:              Right;  [place] 00:26.4 AP% 17.3 CDE 4.59 Fluid Pack Lot #               [id number] No date: COLONOSCOPY No date: CORONARY ANGIOPLASTY     Comment:  LAD: before CABG 09/06/2010: CORONARY ARTERY BYPASS GRAFT     Comment:  At [facility]: LIMA to LAD, left radial to RCA, sequential SVG              to OM3 and 4 03/23/2020: CORONARY STENT INTERVENTION; N/A     Comment:  Procedure: CORONARY STENT INTERVENTION;  Surgeon: Wellington Hampshire, MD;  Location: [place] [facility];  Service:               Cardiovascular;  Laterality: N/A;  RCA 04/11/2020: ESOPHAGOGASTRODUODENOSCOPY (EGD) WITH PROPOFOL; N/A     Comment:  Procedure: ESOPHAGOGASTRODUODENOSCOPY (EGD) WITH               PROPOFOL;  Surgeon: Lucilla Lame, MD;  Location: [facility]               ENDOSCOPY;  Service: Endoscopy;  Laterality: N/A; Jan 2011: heart stents     Comment:  leg stents 06/2009 and 03/2010 06/09/2019: LOWER EXTREMITY ANGIOGRAPHY; Left     Comment:  Procedure: LOWER EXTREMITY ANGIOGRAPHY;  Surgeon:  Schnier, Dolores Lory, MD;  Location: [place] [facility];               Service: Cardiovascular;  Laterality: Left; multiple: PTA of illiac and SFA     Comment:  Dr. Ronalee Belts, s/p revision 11/2012 03/21/2020: RIGHT AND LEFT HEART CATH; N/A     Comment:  Procedure: RIGHT AND LEFT HEART CATH possible                percutaneous intervention;  Surgeon: Wellington Hampshire,               MD;  Location: [place] [facility];  Service:               Cardiovascular;  Laterality: N/A; 02/15/2020: RIGHT/LEFT HEART CATH AND CORONARY/GRAFT ANGIOGRAPHY; N/A     Comment:  Procedure: RIGHT/LEFT HEART CATH AND CORONARY/GRAFT               ANGIOGRAPHY;  Surgeon: Wellington Hampshire, MD;  Location:               [place] [facility];  Service: Cardiovascular;                Laterality: N/A; No date: SPLENECTOMY No date: VASCULAR SURGERY     Comment:  LEG STENTS BMI    Body Mass Index: 36.32 kg/m     Reproductive/Obstetrics negative OB ROS                            Anesthesia Physical Anesthesia Plan  ASA: III  Anesthesia Plan: General   Post-op Pain Management:    Induction:   PONV Risk Score and Plan:   Airway Management Planned:   Additional Equipment:   Intra-op Plan:   Post-operative Plan:   Informed Consent: I have reviewed the patients History and Physical, chart, labs and discussed the procedure including the risks, benefits and alternatives for the proposed anesthesia with the patient or authorized representative who has indicated his/her understanding and acceptance.     Dental Advisory Given  Plan Discussed with: CRNA  Anesthesia Plan Comments:         Anesthesia Quick Evaluation

## 2020-06-06 NOTE — Interval H&P Note (Signed)
History and Physical Interval Note:  06/06/2020 12:11 PM  [name] [name]  has presented today for surgery, with the diagnosis of LT Arm Angiography   Steal syndrome   BARD Rep  cc: M Perrin Smack CONTRAST ALLERGY  Pt to have Covid test on 06-03-20.  The various methods of treatment have been discussed with the patient and family. After consideration of risks, benefits and other options for treatment, the patient has consented to  Procedure(s): UPPER EXTREMITY ANGIOGRAPHY (Left) as a surgical intervention.  The patient's history has been reviewed, patient examined, no change in status, stable for surgery.  I have reviewed the patient's chart and labs.  Questions were answered to the patient's satisfaction.     Leotis Pain

## 2020-06-06 NOTE — Op Note (Signed)
OPERATIVE REPORT   PREOPERATIVE DIAGNOSIS: 1. End-stage renal disease. 2. Steal syndrome, left arm with patent left brachiocephalic AVF.  POSTOPERATIVE DIAGNOSIS: Same as above  PROCEDURE PERFORMED: 1. Ultrasound guidance vascular access to right femoral artery. 2. Catheter placement to left ulnar artery  from right femoral approach. 3. Thoracic aortogram and selective left upper extremity angiogram 4.  Percutaneous transluminal angioplasty of left brachial artery with 5 mm diameter Lutonix drug-coated angioplasty balloon 5.  Percutaneous transluminal angioplasty of left ulnar artery and distal left brachial artery with 4 mm diameter Lutonix drug-coated angioplasty 6.  Stent placement to the left brachial artery with 6 mm diameter by 10 cm length life stent 7.  Percutaneous transluminal angioplasty of the left subclavian artery proximally with 7 mm diameter by 4 cm length Lutonix drug-coated angioplasty balloon 8. StarClose closure device right femoral artery.  SURGEON: Algernon Huxley, MD  ANESTHESIA:MAC  BLOOD LOSS:15 cc  FLUOROSCOPY TIME:5.8  INDICATION FOR PROCEDURE: This is a 64 y.o.male who presented to our office with steal syndrome. The patient's left brachiocephalic AV fistula is working well, but their hand is numb and painful. To further evaluate this to determine what options would be possible to treat the steal syndrome, angiogram of the left upper extremity is indicated. Risks and benefits are discussed. Informed consent was obtained.  DESCRIPTION OF PROCEDURE: The patient was brought to the vascular suite. Moderate conscious sedation was administered during a face to face encounter with the patient throughout the procedure with my supervision of the RN administering medicines and monitoring the patient's vital signs, pulse oximetry, telemetry and mental status throughout from the start of the procedure until the patient was taken to the recovery  room.  Groins were shaved and prepped and sterile surgical field was created. The right femoral head was localized with fluoroscopy and the right femoral artery was then visualized with ultrasound and found to be widely patent. It was then accessed under direct ultrasound guidance without difficulty with a Seldinger needle and a permanent image was recorded. A J-wire and 5-French sheath were then placed. Pigtail catheter was placed into the ascending aorta and a thoracic aortogram was then performed in the LAO projection. This demonstrated normal origins to the great vessels without significant proximal stenoses in the innominate artery and the left carotid artery, but the left proximal subclavian artery had a high-grade stenosis 80% range.  There was a typical and normal configuration of the great vessels. The patient was given 4000 units of intravenous heparin and a headhunter catheter was used to selectively cannulate the left subclavian artery without difficulty. This was then sequentially advanced to the brachial artery and to the brachial bifurcation.  Steal was demonstrated with almost all of the flow from the artery going into the fistula and not downstream to the hand on images with the catheter proximal to the access.  In addition to the steal, there was extremely severe arterial disease.  In the 10 to 12 cm proximal to the AV fistula, the brachial artery was heavily diseased with 3 areas of greater than 60% stenosis.  It was difficult to opacify, but distal to the AV fistula the distal brachial artery and ulnar artery appeared to have at least 70% stenosis.  The radial artery was absent from previous harvest for his CABG.  I then upsized to a long 6 [place] sheath and tried to address the lesions.  The brachial artery proximal to the fistula was then treated with a 5 mm diameter by  15 cm length Lutonix drug-coated angioplasty balloon inflated to 10 atm for 1 minute.  Completion imaging  showed 2 areas of greater than 50% stenosis so a 6 mm diameter by 10 cm length life stent was then deployed in the brachial artery and postdilated with 5 mm balloon with excellent angiographic completion result and less than 20% residual stenosis.  The proximal ulnar artery and distal brachial artery were then treated with a 4 mm diameter by 10 cm length Lutonix drug-coated angioplasty balloon inflated to 12 atm for 1 minute.  Although most of the flow was still going at the fistula, this appeared to be markedly improved with less than 30% residual stenosis after treatment.  I then pulled the sheath back to the left subclavian origin to address the proximal disease.  I elected to balloon angioplasty this with a 7 mm diameter by 4 cm length Lutonix drug-coated angioplasty balloon inflated to 10 atm for 1 minute.  Completion imaging showed about a 20 to 25% residual stenosis that did not appear flow-limiting so I elected not to stent this area. The diagnostic catheter was removed. Oblique arteriogram was performed of the right femoral artery and StarClose closure device deployed in the usual fashion with excellent hemostatic result. The patient tolerated the procedure well and was taken to the recovery room in stable condition.   Leotis Pain 06/06/2020 3:39 PM

## 2020-06-06 NOTE — Discharge Instructions (Signed)
Angiogram, Care After This sheet gives you information about how to care for yourself after your procedure. Your health care provider may also give you more specific instructions. If you have problems or questions, contact your health care provider. What can I expect after the procedure? After the procedure, it is common to have bruising and tenderness at the catheter insertion area. Follow these instructions at home: Insertion site care  Follow instructions from your health care provider about how to take care of your insertion site. Make sure you: ? Wash your hands with soap and water before you change your bandage (dressing). If soap and water are not available, use hand sanitizer. ? Change your dressing as told by your health care provider. ? Leave stitches (sutures), skin glue, or adhesive strips in place. These skin closures may need to stay in place for 2 weeks or longer. If adhesive strip edges start to loosen and curl up, you may trim the loose edges. Do not remove adhesive strips completely unless your health care provider tells you to do that.  Do not take baths, swim, or use a hot tub until your health care provider approves.  You may shower 24-48 hours after the procedure or as told by your health care provider. ? Gently wash the site with plain soap and water. ? Pat the area dry with a clean towel. ? Do not rub the site. This may cause bleeding.  Do not apply powder or lotion to the site. Keep the site clean and dry.  Check your insertion site every day for signs of infection. Check for: ? Redness, swelling, or pain. ? Fluid or blood. ? Warmth. ? Pus or a bad smell. Activity  Rest as told by your health care provider, usually for 1-2 days.  Do not lift anything that is heavier than 10 lbs. (4.5 kg) or as told by your health care provider.  Do not drive for 24 hours if you were given a medicine to help you relax (sedative).  Do not drive or use heavy machinery while  taking prescription pain medicine. General instructions   Return to your normal activities as told by your health care provider, usually in about a week. Ask your health care provider what activities are safe for you.  If the catheter site starts bleeding, lie flat and put pressure on the site. If the bleeding does not stop, get help right away. This is a medical emergency.  Drink enough fluid to keep your urine clear or pale yellow. This helps flush the contrast dye from your body.  Take over-the-counter and prescription medicines only as told by your health care provider.  Keep all follow-up visits as told by your health care provider. This is important. Contact a health care provider if:  You have a fever or chills.  You have redness, swelling, or pain around your insertion site.  You have fluid or blood coming from your insertion site.  The insertion site feels warm to the touch.  You have pus or a bad smell coming from your insertion site.  You have bruising around the insertion site.  You notice blood collecting in the tissue around the catheter site (hematoma). The hematoma may be painful to the touch. Get help right away if:  You have severe pain at the catheter insertion area.  The catheter insertion area swells very fast.  The catheter insertion area is bleeding, and the bleeding does not stop when you hold steady pressure on the area.    The area near or just beyond the catheter insertion site becomes pale, cool, tingly, or numb. These symptoms may represent a serious problem that is an emergency. Do not wait to see if the symptoms will go away. Get medical help right away. Call your local emergency services (911 in the [place] Do not drive yourself to the hospital. Summary  After the procedure, it is common to have bruising and tenderness at the catheter insertion area.  After the procedure, it is important to rest and drink plenty of fluids.  Do not take baths,  swim, or use a hot tub until your health care provider says it is okay to do so. You may shower 24-48 hours after the procedure or as told by your health care provider.  If the catheter site starts bleeding, lie flat and put pressure on the site. If the bleeding does not stop, get help right away. This is a medical emergency. This information is not intended to replace advice given to you by your health care provider. Make sure you discuss any questions you have with your health care provider. Document Revised: 06/21/2017 Document Reviewed: 06/13/2016 Elsevier Patient Education  2020 [employer]  

## 2020-06-06 NOTE — Anesthesia Procedure Notes (Signed)
Procedure Name: MAC Date/Time: 06/06/2020 2:09 PM Performed by: Lily Peer, Chizuko Trine, CRNA Pre-anesthesia Checklist: Patient identified, Emergency Drugs available, Suction available and Patient being monitored Patient Re-evaluated:Patient Re-evaluated prior to induction Oxygen Delivery Method: Simple face mask

## 2020-06-07 ENCOUNTER — Encounter: Payer: Self-pay | Admitting: Vascular Surgery

## 2020-06-07 ENCOUNTER — Telehealth: Payer: Self-pay

## 2020-06-07 NOTE — Anesthesia Postprocedure Evaluation (Signed)
Anesthesia Post Note  Patient: [name] [name]  Procedure(s) Performed: UPPER EXTREMITY ANGIOGRAPHY (Left )  Patient location during evaluation: PACU Anesthesia Type: General Level of consciousness: awake and alert Pain management: pain level controlled Vital Signs Assessment: post-procedure vital signs reviewed and stable Respiratory status: spontaneous breathing, nonlabored ventilation, respiratory function stable and patient connected to nasal cannula oxygen Cardiovascular status: stable and blood pressure returned to baseline Postop Assessment: no apparent nausea or vomiting Anesthetic complications: no   No complications documented.   Last Vitals:  Vitals:   06/06/20 1721 06/06/20 1730  BP: (!) (P) 157/76 (!) 133/117  Pulse:  96  Resp:  16  Temp:    SpO2:  91%    Last Pain:  Vitals:   06/06/20 1730  TempSrc:   PainSc: [name]

## 2020-06-07 NOTE — Telephone Encounter (Signed)
1028 am.  Call received from patient.  He is returning my call to provide an update on his condition.  Patient states surgery for fistula did not go well last month and a 2 clots were removed yesterday.  Patient states he may have to have his left hand amputated.  He will be following up with Dr. Lucky Cowboy next week for a reassessment and to discuss plan of care.  Patient is processing through events and active listening is provided.  Patient believes he may need rehab should the amputation occur and he would like to have a prosthesis if possible.  He acknowledges ongoing issues with pain to the hand and that Dr. Lucky Cowboy is providing pain medication to help alleviate the pain as much as possible.  Patient states he continues to get phone calls from [facility] but he does not wish to deal with them.  He reports a "reassessment fee" that occurs on a frequent basis and states he can't afford to keep doing that.  Patient has asked that Palliative Care be the only team that follows him.  I have advised we will continue to follow him and do in-person visits but we would not be able to provide therapy or direct nursing care should he need this.  Patient voiced understanding and states he will follow up with Dr. Lucky Cowboy next week to see what the next course of action will be.  Patient voiced his concern over the situation and is hopeful a resolution will occur soon.  Patient is requesting that Palliative Care continue with telephonic visits for now.  He is uncertain what the future will hold in terms of hospitalizations.  Advised that our team would continue to follow up and can complete telephonic visits.  No other concerns are voiced by patient at this time.

## 2020-06-08 ENCOUNTER — Ambulatory Visit: Payer: Medicare Other | Admitting: Physician Assistant

## 2020-06-11 ENCOUNTER — Inpatient Hospital Stay
Admission: EM | Admit: 2020-06-11 | Discharge: 2020-06-15 | DRG: 253 | Disposition: A | Discharge status: Hospice | Payer: Medicare Other | Attending: Internal Medicine | Admitting: Internal Medicine

## 2020-06-11 ENCOUNTER — Encounter: Payer: Self-pay | Admitting: Internal Medicine

## 2020-06-11 ENCOUNTER — Emergency Department: Payer: Medicare Other

## 2020-06-11 ENCOUNTER — Other Ambulatory Visit: Payer: Self-pay

## 2020-06-11 DIAGNOSIS — E43 Unspecified severe protein-calorie malnutrition: Secondary | ICD-10-CM | POA: Diagnosis not present

## 2020-06-11 DIAGNOSIS — Z794 Long term (current) use of insulin: Secondary | ICD-10-CM | POA: Diagnosis not present

## 2020-06-11 DIAGNOSIS — Z951 Presence of aortocoronary bypass graft: Secondary | ICD-10-CM | POA: Diagnosis not present

## 2020-06-11 DIAGNOSIS — N186 End stage renal disease: Secondary | ICD-10-CM | POA: Diagnosis not present

## 2020-06-11 DIAGNOSIS — Z8261 Family history of arthritis: Secondary | ICD-10-CM

## 2020-06-11 DIAGNOSIS — L409 Psoriasis, unspecified: Secondary | ICD-10-CM | POA: Diagnosis present

## 2020-06-11 DIAGNOSIS — R5381 Other malaise: Secondary | ICD-10-CM | POA: Diagnosis present

## 2020-06-11 DIAGNOSIS — G629 Polyneuropathy, unspecified: Secondary | ICD-10-CM | POA: Diagnosis present

## 2020-06-11 DIAGNOSIS — Z66 Do not resuscitate: Secondary | ICD-10-CM | POA: Diagnosis present

## 2020-06-11 DIAGNOSIS — K219 Gastro-esophageal reflux disease without esophagitis: Secondary | ICD-10-CM | POA: Diagnosis present

## 2020-06-11 DIAGNOSIS — R600 Localized edema: Secondary | ICD-10-CM | POA: Diagnosis not present

## 2020-06-11 DIAGNOSIS — I5032 Chronic diastolic (congestive) heart failure: Secondary | ICD-10-CM | POA: Diagnosis not present

## 2020-06-11 DIAGNOSIS — T82898A Other specified complication of vascular prosthetic devices, implants and grafts, initial encounter: Secondary | ICD-10-CM | POA: Diagnosis present

## 2020-06-11 DIAGNOSIS — F419 Anxiety disorder, unspecified: Secondary | ICD-10-CM | POA: Diagnosis present

## 2020-06-11 DIAGNOSIS — Z880 Allergy status to penicillin: Secondary | ICD-10-CM

## 2020-06-11 DIAGNOSIS — I9581 Postprocedural hypotension: Secondary | ICD-10-CM | POA: Diagnosis not present

## 2020-06-11 DIAGNOSIS — Z8673 Personal history of transient ischemic attack (TIA), and cerebral infarction without residual deficits: Secondary | ICD-10-CM | POA: Diagnosis not present

## 2020-06-11 DIAGNOSIS — E669 Obesity, unspecified: Secondary | ICD-10-CM | POA: Diagnosis present

## 2020-06-11 DIAGNOSIS — M79604 Pain in right leg: Secondary | ICD-10-CM | POA: Insufficient documentation

## 2020-06-11 DIAGNOSIS — Z955 Presence of coronary angioplasty implant and graft: Secondary | ICD-10-CM

## 2020-06-11 DIAGNOSIS — I25700 Atherosclerosis of coronary artery bypass graft(s), unspecified, with unstable angina pectoris: Secondary | ICD-10-CM | POA: Diagnosis not present

## 2020-06-11 DIAGNOSIS — E785 Hyperlipidemia, unspecified: Secondary | ICD-10-CM | POA: Diagnosis not present

## 2020-06-11 DIAGNOSIS — Z8371 Family history of colonic polyps: Secondary | ICD-10-CM

## 2020-06-11 DIAGNOSIS — J449 Chronic obstructive pulmonary disease, unspecified: Secondary | ICD-10-CM | POA: Diagnosis present

## 2020-06-11 DIAGNOSIS — M7989 Other specified soft tissue disorders: Secondary | ICD-10-CM | POA: Diagnosis not present

## 2020-06-11 DIAGNOSIS — E1122 Type 2 diabetes mellitus with diabetic chronic kidney disease: Secondary | ICD-10-CM | POA: Diagnosis present

## 2020-06-11 DIAGNOSIS — I2510 Atherosclerotic heart disease of native coronary artery without angina pectoris: Secondary | ICD-10-CM | POA: Diagnosis present

## 2020-06-11 DIAGNOSIS — Y838 Other surgical procedures as the cause of abnormal reaction of the patient, or of later complication, without mention of misadventure at the time of the procedure: Secondary | ICD-10-CM | POA: Diagnosis present

## 2020-06-11 DIAGNOSIS — I10 Essential (primary) hypertension: Secondary | ICD-10-CM | POA: Diagnosis present

## 2020-06-11 DIAGNOSIS — Z7982 Long term (current) use of aspirin: Secondary | ICD-10-CM

## 2020-06-11 DIAGNOSIS — Z87891 Personal history of nicotine dependence: Secondary | ICD-10-CM

## 2020-06-11 DIAGNOSIS — Z515 Encounter for palliative care: Secondary | ICD-10-CM | POA: Diagnosis not present

## 2020-06-11 DIAGNOSIS — I739 Peripheral vascular disease, unspecified: Secondary | ICD-10-CM | POA: Diagnosis present

## 2020-06-11 DIAGNOSIS — E8809 Other disorders of plasma-protein metabolism, not elsewhere classified: Secondary | ICD-10-CM | POA: Diagnosis not present

## 2020-06-11 DIAGNOSIS — I9589 Other hypotension: Secondary | ICD-10-CM | POA: Diagnosis not present

## 2020-06-11 DIAGNOSIS — Y832 Surgical operation with anastomosis, bypass or graft as the cause of abnormal reaction of the patient, or of later complication, without mention of misadventure at the time of the procedure: Secondary | ICD-10-CM | POA: Diagnosis present

## 2020-06-11 DIAGNOSIS — Z811 Family history of alcohol abuse and dependence: Secondary | ICD-10-CM

## 2020-06-11 DIAGNOSIS — Z9081 Acquired absence of spleen: Secondary | ICD-10-CM

## 2020-06-11 DIAGNOSIS — I509 Heart failure, unspecified: Secondary | ICD-10-CM | POA: Diagnosis not present

## 2020-06-11 DIAGNOSIS — Z20822 Contact with and (suspected) exposure to covid-19: Secondary | ICD-10-CM | POA: Diagnosis present

## 2020-06-11 DIAGNOSIS — Z6841 Body Mass Index (BMI) 40.0 and over, adult: Secondary | ICD-10-CM

## 2020-06-11 DIAGNOSIS — I5042 Chronic combined systolic (congestive) and diastolic (congestive) heart failure: Secondary | ICD-10-CM | POA: Diagnosis present

## 2020-06-11 DIAGNOSIS — Z820 Family history of epilepsy and other diseases of the nervous system: Secondary | ICD-10-CM

## 2020-06-11 DIAGNOSIS — I6529 Occlusion and stenosis of unspecified carotid artery: Secondary | ICD-10-CM | POA: Diagnosis present

## 2020-06-11 DIAGNOSIS — R279 Unspecified lack of coordination: Secondary | ICD-10-CM | POA: Diagnosis not present

## 2020-06-11 DIAGNOSIS — J9691 Respiratory failure, unspecified with hypoxia: Secondary | ICD-10-CM | POA: Diagnosis not present

## 2020-06-11 DIAGNOSIS — F32A Depression, unspecified: Secondary | ICD-10-CM | POA: Diagnosis present

## 2020-06-11 DIAGNOSIS — Z7902 Long term (current) use of antithrombotics/antiplatelets: Secondary | ICD-10-CM

## 2020-06-11 DIAGNOSIS — I770 Arteriovenous fistula, acquired: Secondary | ICD-10-CM | POA: Diagnosis not present

## 2020-06-11 DIAGNOSIS — N189 Chronic kidney disease, unspecified: Secondary | ICD-10-CM | POA: Diagnosis present

## 2020-06-11 DIAGNOSIS — I998 Other disorder of circulatory system: Secondary | ICD-10-CM | POA: Diagnosis present

## 2020-06-11 DIAGNOSIS — Z992 Dependence on renal dialysis: Secondary | ICD-10-CM | POA: Diagnosis not present

## 2020-06-11 DIAGNOSIS — T82828D Fibrosis of vascular prosthetic devices, implants and grafts, subsequent encounter: Secondary | ICD-10-CM | POA: Diagnosis not present

## 2020-06-11 DIAGNOSIS — Z833 Family history of diabetes mellitus: Secondary | ICD-10-CM

## 2020-06-11 DIAGNOSIS — Z91041 Radiographic dye allergy status: Secondary | ICD-10-CM

## 2020-06-11 DIAGNOSIS — Z86010 Personal history of colonic polyps: Secondary | ICD-10-CM

## 2020-06-11 DIAGNOSIS — I252 Old myocardial infarction: Secondary | ICD-10-CM | POA: Diagnosis not present

## 2020-06-11 DIAGNOSIS — G9340 Encephalopathy, unspecified: Secondary | ICD-10-CM | POA: Diagnosis present

## 2020-06-11 DIAGNOSIS — I350 Nonrheumatic aortic (valve) stenosis: Secondary | ICD-10-CM | POA: Diagnosis not present

## 2020-06-11 DIAGNOSIS — D631 Anemia in chronic kidney disease: Secondary | ICD-10-CM | POA: Diagnosis present

## 2020-06-11 DIAGNOSIS — I132 Hypertensive heart and chronic kidney disease with heart failure and with stage 5 chronic kidney disease, or end stage renal disease: Secondary | ICD-10-CM | POA: Diagnosis present

## 2020-06-11 DIAGNOSIS — I5040 Unspecified combined systolic (congestive) and diastolic (congestive) heart failure: Secondary | ICD-10-CM | POA: Diagnosis present

## 2020-06-11 DIAGNOSIS — E1142 Type 2 diabetes mellitus with diabetic polyneuropathy: Secondary | ICD-10-CM | POA: Diagnosis not present

## 2020-06-11 DIAGNOSIS — D509 Iron deficiency anemia, unspecified: Secondary | ICD-10-CM | POA: Diagnosis present

## 2020-06-11 DIAGNOSIS — I959 Hypotension, unspecified: Secondary | ICD-10-CM | POA: Diagnosis not present

## 2020-06-11 DIAGNOSIS — N185 Chronic kidney disease, stage 5: Secondary | ICD-10-CM | POA: Diagnosis present

## 2020-06-11 DIAGNOSIS — N184 Chronic kidney disease, stage 4 (severe): Secondary | ICD-10-CM | POA: Diagnosis not present

## 2020-06-11 DIAGNOSIS — G4733 Obstructive sleep apnea (adult) (pediatric): Secondary | ICD-10-CM | POA: Diagnosis present

## 2020-06-11 DIAGNOSIS — Z803 Family history of malignant neoplasm of breast: Secondary | ICD-10-CM

## 2020-06-11 DIAGNOSIS — Z801 Family history of malignant neoplasm of trachea, bronchus and lung: Secondary | ICD-10-CM

## 2020-06-11 DIAGNOSIS — N2581 Secondary hyperparathyroidism of renal origin: Secondary | ICD-10-CM | POA: Diagnosis present

## 2020-06-11 DIAGNOSIS — D72829 Elevated white blood cell count, unspecified: Secondary | ICD-10-CM | POA: Diagnosis present

## 2020-06-11 DIAGNOSIS — E782 Mixed hyperlipidemia: Secondary | ICD-10-CM | POA: Diagnosis present

## 2020-06-11 DIAGNOSIS — M79602 Pain in left arm: Secondary | ICD-10-CM | POA: Diagnosis not present

## 2020-06-11 DIAGNOSIS — E1151 Type 2 diabetes mellitus with diabetic peripheral angiopathy without gangrene: Secondary | ICD-10-CM | POA: Diagnosis present

## 2020-06-11 DIAGNOSIS — L4050 Arthropathic psoriasis, unspecified: Secondary | ICD-10-CM | POA: Diagnosis not present

## 2020-06-11 DIAGNOSIS — Z452 Encounter for adjustment and management of vascular access device: Secondary | ICD-10-CM

## 2020-06-11 DIAGNOSIS — I120 Hypertensive chronic kidney disease with stage 5 chronic kidney disease or end stage renal disease: Secondary | ICD-10-CM | POA: Diagnosis not present

## 2020-06-11 DIAGNOSIS — M79605 Pain in left leg: Secondary | ICD-10-CM | POA: Diagnosis not present

## 2020-06-11 DIAGNOSIS — Z79899 Other long term (current) drug therapy: Secondary | ICD-10-CM

## 2020-06-11 DIAGNOSIS — M6281 Muscle weakness (generalized): Secondary | ICD-10-CM | POA: Diagnosis not present

## 2020-06-11 DIAGNOSIS — Z888 Allergy status to other drugs, medicaments and biological substances status: Secondary | ICD-10-CM

## 2020-06-11 DIAGNOSIS — N400 Enlarged prostate without lower urinary tract symptoms: Secondary | ICD-10-CM | POA: Diagnosis present

## 2020-06-11 DIAGNOSIS — T82898D Other specified complication of vascular prosthetic devices, implants and grafts, subsequent encounter: Secondary | ICD-10-CM

## 2020-06-11 DIAGNOSIS — E876 Hypokalemia: Secondary | ICD-10-CM | POA: Diagnosis present

## 2020-06-11 DIAGNOSIS — R52 Pain, unspecified: Secondary | ICD-10-CM | POA: Diagnosis not present

## 2020-06-11 DIAGNOSIS — E1129 Type 2 diabetes mellitus with other diabetic kidney complication: Secondary | ICD-10-CM | POA: Diagnosis present

## 2020-06-11 DIAGNOSIS — Z7189 Other specified counseling: Secondary | ICD-10-CM | POA: Diagnosis not present

## 2020-06-11 DIAGNOSIS — I639 Cerebral infarction, unspecified: Secondary | ICD-10-CM | POA: Diagnosis present

## 2020-06-11 DIAGNOSIS — R627 Adult failure to thrive: Secondary | ICD-10-CM | POA: Diagnosis present

## 2020-06-11 DIAGNOSIS — I5043 Acute on chronic combined systolic (congestive) and diastolic (congestive) heart failure: Secondary | ICD-10-CM | POA: Diagnosis not present

## 2020-06-11 LAB — CBC WITH DIFFERENTIAL/PLATELET
Abs Immature Granulocytes: 0.09 10*3/uL — ABNORMAL HIGH (ref 0.00–0.07)
Basophils Absolute: 0 10*3/uL (ref 0.0–0.1)
Basophils Relative: 0 %
Eosinophils Absolute: 0 10*3/uL (ref 0.0–0.5)
Eosinophils Relative: 0 %
HCT: 27.9 % — ABNORMAL LOW (ref 39.0–52.0)
Hemoglobin: 8.6 g/dL — ABNORMAL LOW (ref 13.0–17.0)
Immature Granulocytes: 1 %
Lymphocytes Relative: 6 %
Lymphs Abs: 0.9 10*3/uL (ref 0.7–4.0)
MCH: 27.9 pg (ref 26.0–34.0)
MCHC: 30.8 g/dL (ref 30.0–36.0)
MCV: 90.6 fL (ref 80.0–100.0)
Monocytes Absolute: 0.9 10*3/uL (ref 0.1–1.0)
Monocytes Relative: 6 %
Neutro Abs: 12.4 10*3/uL — ABNORMAL HIGH (ref 1.7–7.7)
Neutrophils Relative %: 87 %
Platelets: 259 10*3/uL (ref 150–400)
RBC: 3.08 MIL/uL — ABNORMAL LOW (ref 4.22–5.81)
RDW: 15.7 % — ABNORMAL HIGH (ref 11.5–15.5)
WBC: 14.4 10*3/uL — ABNORMAL HIGH (ref 4.0–10.5)
nRBC: 0.8 % — ABNORMAL HIGH (ref 0.0–0.2)

## 2020-06-11 LAB — URINALYSIS, COMPLETE (UACMP) WITH MICROSCOPIC
Bilirubin Urine: NEGATIVE
Glucose, UA: NEGATIVE mg/dL
Hgb urine dipstick: NEGATIVE
Ketones, ur: NEGATIVE mg/dL
Leukocytes,Ua: NEGATIVE
Nitrite: NEGATIVE
Protein, ur: 100 mg/dL — AB
Specific Gravity, Urine: 1.016 (ref 1.005–1.030)
Squamous Epithelial / HPF: NONE SEEN (ref 0–5)
pH: 5 (ref 5.0–8.0)

## 2020-06-11 LAB — BASIC METABOLIC PANEL
Anion gap: 16 — ABNORMAL HIGH (ref 5–15)
BUN: 110 mg/dL — ABNORMAL HIGH (ref 8–23)
CO2: 25 mmol/L (ref 22–32)
Calcium: 8.4 mg/dL — ABNORMAL LOW (ref 8.9–10.3)
Chloride: 92 mmol/L — ABNORMAL LOW (ref 98–111)
Creatinine, Ser: 4.24 mg/dL — ABNORMAL HIGH (ref 0.61–1.24)
GFR, Estimated: 15 mL/min — ABNORMAL LOW (ref >60)
Glucose, Bld: 211 mg/dL — ABNORMAL HIGH (ref 70–99)
Potassium: 3.4 mmol/L — ABNORMAL LOW (ref 3.5–5.1)
Sodium: 133 mmol/L — ABNORMAL LOW (ref 135–145)

## 2020-06-11 LAB — RESP PANEL BY RT-PCR (FLU A&B, COVID) ARPGX2
Influenza A by PCR: NEGATIVE
Influenza B by PCR: NEGATIVE
SARS Coronavirus 2 by RT PCR: NEGATIVE

## 2020-06-11 LAB — GLUCOSE, CAPILLARY
Glucose-Capillary: 178 mg/dL — ABNORMAL HIGH (ref 70–99)
Glucose-Capillary: 171 mg/dL — ABNORMAL HIGH (ref 70–99)
Glucose-Capillary: 153 mg/dL — ABNORMAL HIGH (ref 70–99)

## 2020-06-11 LAB — APTT: aPTT: 35 seconds (ref 24–36)

## 2020-06-11 LAB — MAGNESIUM: Magnesium: 2.6 mg/dL — ABNORMAL HIGH (ref 1.7–2.4)

## 2020-06-11 LAB — PROTIME-INR
INR: 1.1 (ref 0.8–1.2)
Prothrombin Time: 14 seconds (ref 11.4–15.2)

## 2020-06-11 MED ORDER — GABAPENTIN 100 MG PO CAPS
100.0000 mg | ORAL_CAPSULE | Freq: Every day | ORAL | Status: DC
  Administered 2020-06-11 – 2020-06-14 (×4): 100 mg via ORAL
  Filled 2020-06-11 (×4): qty 1

## 2020-06-11 MED ORDER — B COMPLEX-C PO TABS
1.0000 | ORAL_TABLET | Freq: Every day | ORAL | Status: DC
  Administered 2020-06-11 – 2020-06-14 (×3): 1 via ORAL
  Filled 2020-06-11 (×5): qty 1

## 2020-06-11 MED ORDER — HEPARIN SODIUM (PORCINE) 5000 UNIT/ML IJ SOLN
5000.0000 [IU] | Freq: Three times a day (TID) | INTRAMUSCULAR | Status: DC
  Administered 2020-06-11 – 2020-06-15 (×10): 5000 [IU] via SUBCUTANEOUS
  Filled 2020-06-11 (×10): qty 1

## 2020-06-11 MED ORDER — DM-GUAIFENESIN ER 30-600 MG PO TB12: 1.0000 | ORAL_TABLET | Freq: Two times a day (BID) | ORAL | Status: DC | PRN

## 2020-06-11 MED ORDER — MORPHINE SULFATE (PF) 2 MG/ML IV SOLN
2.0000 mg | INTRAVENOUS | Status: DC | PRN
  Administered 2020-06-11 – 2020-06-14 (×4): 2 mg via INTRAVENOUS
  Filled 2020-06-11 (×4): qty 1

## 2020-06-11 MED ORDER — LACTATED RINGERS IV BOLUS
500.0000 mL | Freq: Once | INTRAVENOUS | Status: AC
  Administered 2020-06-11: 500 mL via INTRAVENOUS

## 2020-06-11 MED ORDER — OXYCODONE-ACETAMINOPHEN 5-325 MG PO TABS
1.0000 | ORAL_TABLET | ORAL | Status: DC | PRN
  Administered 2020-06-11 – 2020-06-15 (×12): 1 via ORAL
  Filled 2020-06-11 (×12): qty 1

## 2020-06-11 MED ORDER — MORPHINE SULFATE (PF) 4 MG/ML IV SOLN
4.0000 mg | Freq: Once | INTRAVENOUS | Status: AC
  Administered 2020-06-11: 4 mg via INTRAVENOUS
  Filled 2020-06-11: qty 1

## 2020-06-11 MED ORDER — HYDRALAZINE HCL 20 MG/ML IJ SOLN: 5.0000 mg | INTRAMUSCULAR | Status: DC | PRN

## 2020-06-11 MED ORDER — FINASTERIDE 5 MG PO TABS
5.0000 mg | ORAL_TABLET | Freq: Every day | ORAL | Status: DC
  Administered 2020-06-11 – 2020-06-14 (×3): 5 mg via ORAL
  Filled 2020-06-11 (×3): qty 1

## 2020-06-11 MED ORDER — ALUM & MAG HYDROXIDE-SIMETH 200-200-20 MG/5ML PO SUSP
30.0000 mL | Freq: Four times a day (QID) | ORAL | Status: DC | PRN
  Administered 2020-06-11: 30 mL via ORAL
  Filled 2020-06-11: qty 30

## 2020-06-11 MED ORDER — TAMSULOSIN HCL 0.4 MG PO CAPS
0.4000 mg | ORAL_CAPSULE | Freq: Every day | ORAL | Status: DC
  Administered 2020-06-11 – 2020-06-14 (×3): 0.4 mg via ORAL
  Filled 2020-06-11 (×3): qty 1

## 2020-06-11 MED ORDER — BUSPIRONE HCL 10 MG PO TABS
20.0000 mg | ORAL_TABLET | Freq: Two times a day (BID) | ORAL | Status: DC
  Administered 2020-06-11 – 2020-06-14 (×7): 20 mg via ORAL
  Filled 2020-06-11 (×11): qty 2

## 2020-06-11 MED ORDER — NICOTINE 21 MG/24HR TD PT24: 21.0000 mg | MEDICATED_PATCH | Freq: Every day | TRANSDERMAL | Status: DC

## 2020-06-11 MED ORDER — INSULIN ASPART 100 UNIT/ML ~~LOC~~ SOLN
0.0000 [IU] | Freq: Three times a day (TID) | SUBCUTANEOUS | Status: DC
  Administered 2020-06-11 – 2020-06-12 (×4): 2 [IU] via SUBCUTANEOUS
  Administered 2020-06-12 – 2020-06-13 (×2): 1 [IU] via SUBCUTANEOUS
  Administered 2020-06-14: 3 [IU] via SUBCUTANEOUS
  Filled 2020-06-11 (×7): qty 1

## 2020-06-11 MED ORDER — ISOSORBIDE MONONITRATE ER 30 MG PO TB24
30.0000 mg | ORAL_TABLET | Freq: Every day | ORAL | Status: DC
  Administered 2020-06-11 – 2020-06-14 (×3): 30 mg via ORAL
  Filled 2020-06-11 (×3): qty 1

## 2020-06-11 MED ORDER — ROSUVASTATIN CALCIUM 10 MG PO TABS
10.0000 mg | ORAL_TABLET | Freq: Every day | ORAL | Status: DC
  Administered 2020-06-11 – 2020-06-14 (×3): 10 mg via ORAL
  Filled 2020-06-11 (×3): qty 1

## 2020-06-11 MED ORDER — DEXTRAN 70-HYPROMELLOSE 0.1-0.3 % OP SOLN: 1.0000 [drp] | Freq: Four times a day (QID) | OPHTHALMIC | Status: DC | PRN

## 2020-06-11 MED ORDER — FUROSEMIDE 10 MG/ML IJ SOLN
120.0000 mg | Freq: Two times a day (BID) | INTRAVENOUS | Status: DC
  Administered 2020-06-11 – 2020-06-14 (×6): 120 mg via INTRAVENOUS
  Filled 2020-06-11 (×13): qty 12

## 2020-06-11 MED ORDER — CALCITRIOL 0.25 MCG PO CAPS
0.2500 ug | ORAL_CAPSULE | ORAL | Status: DC
  Filled 2020-06-11: qty 1

## 2020-06-11 MED ORDER — ARIPIPRAZOLE 2 MG PO TABS
2.0000 mg | ORAL_TABLET | Freq: Every day | ORAL | Status: DC
  Administered 2020-06-11 – 2020-06-14 (×4): 2 mg via ORAL
  Filled 2020-06-11 (×7): qty 1

## 2020-06-11 MED ORDER — HYDROMORPHONE HCL 1 MG/ML IJ SOLN
0.5000 mg | Freq: Once | INTRAMUSCULAR | Status: AC
  Administered 2020-06-11: 0.5 mg via INTRAVENOUS
  Filled 2020-06-11: qty 1

## 2020-06-11 MED ORDER — NICOTINE 21 MG/24HR TD PT24
21.0000 mg | MEDICATED_PATCH | Freq: Every day | TRANSDERMAL | Status: DC
  Administered 2020-06-11 – 2020-06-14 (×2): 21 mg via TRANSDERMAL
  Filled 2020-06-11 (×3): qty 1

## 2020-06-11 MED ORDER — ASPIRIN EC 81 MG PO TBEC
81.0000 mg | DELAYED_RELEASE_TABLET | Freq: Every day | ORAL | Status: DC
  Administered 2020-06-11 – 2020-06-14 (×3): 81 mg via ORAL
  Filled 2020-06-11 (×3): qty 1

## 2020-06-11 MED ORDER — POTASSIUM CHLORIDE CRYS ER 20 MEQ PO TBCR
20.0000 meq | EXTENDED_RELEASE_TABLET | Freq: Once | ORAL | Status: AC
  Administered 2020-06-11: 20 meq via ORAL
  Filled 2020-06-11: qty 1

## 2020-06-11 MED ORDER — NITROGLYCERIN 0.4 MG SL SUBL: 0.4000 mg | SUBLINGUAL_TABLET | SUBLINGUAL | Status: DC | PRN

## 2020-06-11 MED ORDER — SERTRALINE HCL 50 MG PO TABS
100.0000 mg | ORAL_TABLET | Freq: Every day | ORAL | Status: DC
  Administered 2020-06-11 – 2020-06-14 (×3): 100 mg via ORAL
  Filled 2020-06-11 (×3): qty 2

## 2020-06-11 MED ORDER — ALBUTEROL SULFATE HFA 108 (90 BASE) MCG/ACT IN AERS
2.0000 | INHALATION_SPRAY | RESPIRATORY_TRACT | Status: DC | PRN
  Filled 2020-06-11: qty 6.7

## 2020-06-11 MED ORDER — ACETAMINOPHEN 325 MG PO TABS: 650.0000 mg | ORAL_TABLET | Freq: Four times a day (QID) | ORAL | Status: DC | PRN

## 2020-06-11 MED ORDER — POLYVINYL ALCOHOL 1.4 % OP SOLN
1.0000 [drp] | OPHTHALMIC | Status: DC | PRN
  Filled 2020-06-11: qty 15

## 2020-06-11 MED ORDER — ENSURE ENLIVE PO LIQD
237.0000 mL | ORAL | Status: DC
  Administered 2020-06-11 – 2020-06-14 (×3): 237 mL via ORAL

## 2020-06-11 MED ORDER — INSULIN ASPART 100 UNIT/ML ~~LOC~~ SOLN: 0.0000 [IU] | Freq: Every day | SUBCUTANEOUS | Status: DC

## 2020-06-11 MED ORDER — ONDANSETRON HCL 4 MG/2ML IJ SOLN
4.0000 mg | Freq: Three times a day (TID) | INTRAMUSCULAR | Status: DC | PRN
  Administered 2020-06-11: 4 mg via INTRAVENOUS
  Filled 2020-06-11: qty 2

## 2020-06-11 MED ORDER — CARVEDILOL 6.25 MG PO TABS
3.1250 mg | ORAL_TABLET | Freq: Two times a day (BID) | ORAL | Status: DC
  Administered 2020-06-11 – 2020-06-14 (×6): 3.125 mg via ORAL
  Filled 2020-06-11 (×7): qty 1

## 2020-06-11 NOTE — Consult Note (Signed)
Patient well known to [place].  Recently treated for left upper extremity ischemia with 3 separate intervention sites the left upper extremity.  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  Pain did not improve with arterial intervention.  That is his main complaint today.  He has multiple complaints.  His feet are burning and hurt even though his feet are warm and seem well perfused and his most recent arterial studies were fairly normal.  He resumed smoking but says he will quit again.  He has been noncompliant with medicines as well.  He says he just gets "so depressed".  He is in terrible pain and comes in today for evaluation and treatment.  His hand remains chronically ischemic.  There is not any acute worsening. At this point, we will need to ligate or coil his fistula.  I will put him on the schedule for Monday morning either in the operating room or special procedures which ever is available to occlude the AV fistula.

## 2020-06-11 NOTE — ED Notes (Signed)
Left ankle blood pressure: 138/105  Right ankle blood pressure: 133/89  Right arm blood pressure: 145/71

## 2020-06-11 NOTE — ED Notes (Signed)
Patient incontinent of urine. Patient cleaned and brief changed

## 2020-06-11 NOTE — ED Provider Notes (Signed)
[facility] Emergency Department Provider Note   ____________________________________________   First MD Initiated Contact with Patient 06/11/20 0725     (approximate)  I have reviewed the triage vital signs and the nursing notes.   HISTORY  Chief Complaint Foot Pain    HPI [name] [name] is a 64 y.o. male with past medical history of hypertension, diabetes, CAD status post CABG, severe aortic stenosis, diastolic CHF, COPD, and CKD nearing ESRD who presents to the ED complaining of arm and leg pain.  Patient reports he has had 2 weeks of worsening pain in his bilateral lower extremities as well as his left upper extremity.  He was recently diagnosed with left upper extremity steal syndrome related to AV fistula and had angioplasty performed 5 days ago with Dr. Lucky Cowboy.  He states his pain has been unchanged since then and has seemed to worsen acutely over the past couple of days.  He states he was told by Dr. Lucky Cowboy that he might need his left arm amputated.  He denies significant issues with his legs in the past but recently has noted increased swelling compared to previous.  He denies any fevers, cough, chest pain, or shortness of breath.        Past Medical History:  Diagnosis Date  . 3-vessel CAD    8/30 LHC with radial graft to RCA occluded at the ostium with critical stenosis of the ostial native RCA.  . Adenomatous colon polyp   . Allergy   . Anemia   . Anxiety   . Arthritis    RA  . Asthma   . Bell's palsy 2007   neuropathy  . CAD (coronary artery disease)    03/21/2020 LHC with patent LIMA to LAD and SVG to OM 3.  Radial graft to RCA occluded at the ostium with critical stenosis of the ostial and native RCA.  . Carotid artery occlusion    Bilateral carotid stenosis s/p left-sided CEA.  . Cataract    Dr. Dawna Part  . CKD (chronic kidney disease), stage IV ([place])    03/2020 vascular surgery access planned  . Depression   . Diabetes mellitus   .  Diverticulosis   . Duodenitis   . DVT (deep venous thrombosis) (HCC)    in leg  . Dyspnea    with edema  . ESRD (end stage renal disease) ([place])   . Gastropathy   . GERD (gastroesophageal reflux disease)   . Heart failure with preserved ejection fraction ([place])    8/30 RHC with moderately to severely elevated filling pressures and pulmonary wedge pressure 31 mmHg, moderate pulmonary hypertension  . Heart murmur   . Helicobacter pylori gastritis   . Hx of CABG    2012  . Hyperlipidemia   . Hypertension   . Mitral regurgitation and aortic stenosis    RHC 8/30 and echo 12/2019.  [name] Myocardial infarction ([place]) 2021  . Neuropathy   . Obesity   . Peripheral vascular disease ([place])    S/p extensive revascularization by vascular surgery 05/2019  . Post splenectomy syndrome   . Psoriasis   . Severe aortic stenosis    03/21/20 RHC with peak gradient 27.4 mmHg and valve area 0.87.  [name] Sleep apnea    uses cpap  . Stroke ([place])   . Tobacco use disorder    recently quit  . Tubular adenoma 08/2013   Dr. Hilarie Fredrickson  . Weak urinary stream     Patient Active Problem List  Diagnosis Date Noted  . COPD (chronic obstructive pulmonary disease) ([place]) 06/11/2020  . Stroke ([place]) 06/11/2020  . Acute on chronic combined systolic (congestive) and diastolic (congestive) heart failure ([place]) 05/20/2020  . Demand ischemia ([place])   . Acute blood loss anemia 04/09/2020  . Chronic combined systolic and diastolic CHF (congestive heart failure) ([employer]) 04/09/2020  . CKD (chronic kidney disease), stage IV ([place]) 04/09/2020  . Depression 04/09/2020  . Aortic stenosis 04/09/2020  . Elevated troponin I level 04/09/2020  . Anemia in chronic kidney disease 04/07/2020  . Ischemic cardiomyopathy 03/25/2020  . Non-ST elevation (NSTEMI) myocardial infarction ([place]) 03/23/2020  . CAD (coronary artery disease) 03/22/2020  . Chronic heart failure with preserved ejection fraction (HFpEF) ([place]) 03/21/2020  . Hx of CABG 03/21/2020   . Anemia 03/21/2020  . Unstable angina ([place]) 03/20/2020  . Chronic kidney disease   . History of non-ST elevation myocardial infarction (NSTEMI) 02/13/2020  . Moderate aortic valve stenosis 02/13/2020  . Acute on chronic combined systolic and diastolic CHF (congestive heart failure) ([place]) 02/13/2020  . Hypertensive urgency 02/13/2020  . Fall with injury 11/16/2019  . Leg weakness, bilateral 11/16/2019  . Benign hypertensive kidney disease with chronic kidney disease 09/02/2019  . Proteinuria 09/02/2019  . Secondary hyperparathyroidism of renal origin ([place]) 09/02/2019  . Left arm pain-due to ischemia secondary to steal syndrome 02/11/2019  . Gastroesophageal reflux 03/25/2018  . Atherosclerosis of aorta ([place]) 09/04/2017  . Thoracic aortic aneurysm ([place]) 09/04/2017  . Right thyroid nodule 04/12/2017  . Thrombocytopenia ([place]) 03/16/2017  . CKD stage 4 due to type 2 diabetes mellitus ([place]) 03/15/2017  . Advance directive discussed with patient 03/15/2017  . BMI 40.0-44.9, adult ([place]) 09/12/2016  . Psoriatic arthritis ([place])   . Lung nodule < 6cm on CT 03/29/2016  . COPD with acute bronchitis ([place]) 03/29/2016  . Headache 09/14/2015  . Constipation 10/05/2014  . PVD (peripheral vascular disease) ([place]) 04/23/2014  . Memory loss 04/23/2014  . Preventative health care 11/23/2013  . Benign prostatic hypertrophy without urinary obstruction 03/25/2013  . Enlarged prostate without lower urinary tract symptoms (l[place]) 03/25/2013  . Peripheral vascular disease due to secondary diabetes mellitus ([place]) 03/25/2013  . Obesity 02/12/2013  . Atherosclerotic heart disease of native coronary artery with angina pectoris ([place]) 06/06/2012  . Type II diabetes mellitus with renal manifestations ([place]) 06/06/2012  . Severe aortic stenosis   . Low back pain 12/27/2011  . Bipolar disorder ([place]) 05/28/2011  . Carotid artery disease ([place]) 04/17/2011  . Occlusion and stenosis of carotid artery 04/17/2011  .  Obstructive sleep apnea 03/05/2011  . Psoriasis 03/05/2011  . Type 2 diabetes mellitus with diabetic peripheral angiopathy without gangrene ([place]) 03/05/2011  . Polyneuropathy 03/05/2011  . 3-vessel CAD   . Hyperlipidemia, mixed   . Essential hypertension     Past Surgical History:  Procedure Laterality Date  . AV FISTULA PLACEMENT N/A 05/24/2020   Procedure: ARTERIOVENOUS (AV) FISTULA CREATION (Brachio-cephalic);  Surgeon: Algernon Huxley, MD;  Location: [facility] ORS;  Service: Vascular;  Laterality: N/A;  . CARDIAC CATHETERIZATION  08/2010   LAD: 80% ISR, RCA: 80% ostial, OM 80-90%  . CAROTID ENDARTERECTOMY  08/2010   left/ Dr. Kellie Simmering  . CATARACT EXTRACTION W/PHACO Left 07/09/2017   Procedure: CATARACT EXTRACTION PHACO AND INTRAOCULAR LENS PLACEMENT (IOC);  Surgeon: Birder Robson, MD;  Location: [facility] ORS;  Service: Ophthalmology;  Laterality: Left;  [place] 00:23 AP% 14.2 CDE 3.26 Fluid pack lot # [id number]  . CATARACT EXTRACTION W/PHACO Right 09/10/2017  Procedure: CATARACT EXTRACTION PHACO AND INTRAOCULAR LENS PLACEMENT (IOC);  Surgeon: Birder Robson, MD;  Location: [facility] ORS;  Service: Ophthalmology;  Laterality: Right;  [place] 00:26.4 AP% 17.3 CDE 4.59 Fluid Pack Lot # [id number]  . COLONOSCOPY    . CORONARY ANGIOPLASTY     LAD: before CABG  . CORONARY ARTERY BYPASS GRAFT  09/06/2010   At [facility]: LIMA to LAD, left radial to RCA, sequential SVG to OM3 and 4  . CORONARY STENT INTERVENTION N/A 03/23/2020   Procedure: CORONARY STENT INTERVENTION;  Surgeon: Wellington Hampshire, MD;  Location: [place] [facility];  Service: Cardiovascular;  Laterality: N/A;  RCA  . ESOPHAGOGASTRODUODENOSCOPY (EGD) WITH PROPOFOL N/A 04/11/2020   Procedure: ESOPHAGOGASTRODUODENOSCOPY (EGD) WITH PROPOFOL;  Surgeon: Lucilla Lame, MD;  Location: [facility] ENDOSCOPY;  Service: Endoscopy;  Laterality: N/A;  . heart stents  Jan 2011   leg stents 06/2009 and 03/2010  . LOWER EXTREMITY ANGIOGRAPHY Left 06/09/2019   Procedure: LOWER  EXTREMITY ANGIOGRAPHY;  Surgeon: Katha Cabal, MD;  Location: [place] [facility];  Service: Cardiovascular;  Laterality: Left;  . PTA of illiac and SFA  multiple   Dr. Ronalee Belts, s/p revision 11/2012  . RIGHT AND LEFT HEART CATH N/A 03/21/2020   Procedure: RIGHT AND LEFT HEART CATH possible percutaneous intervention;  Surgeon: Wellington Hampshire, MD;  Location: [place] [facility];  Service: Cardiovascular;  Laterality: N/A;  . RIGHT/LEFT HEART CATH AND CORONARY/GRAFT ANGIOGRAPHY N/A 02/15/2020   Procedure: RIGHT/LEFT HEART CATH AND CORONARY/GRAFT ANGIOGRAPHY;  Surgeon: Wellington Hampshire, MD;  Location: [place] [facility];  Service: Cardiovascular;  Laterality: N/A;  . SPLENECTOMY    . UPPER EXTREMITY ANGIOGRAPHY Left 06/06/2020   Procedure: UPPER EXTREMITY ANGIOGRAPHY;  Surgeon: Algernon Huxley, MD;  Location: [place] [facility];  Service: Cardiovascular;  Laterality: Left;  [name] VASCULAR SURGERY     LEG STENTS    Prior to Admission medications   Medication Sig Start Date End Date Taking? Authorizing Provider  acetaminophen (TYLENOL) 500 MG tablet Take 500 mg by mouth every 6 (six) hours as needed for moderate pain or headache.    [provider]  albuterol (PROAIR HFA) 108 (90 Base) MCG/ACT inhaler Inhale 2 puffs into the lungs every 6 (six) hours as needed for shortness of breath. 06/01/20   [name], MD  alum & mag hydroxide-simeth (MAALOX/MYLANTA) 200-200-20 MG/5ML suspension Take 30 mLs by mouth every 6 (six) hours as needed for indigestion or heartburn.    [provider]  ARIPiprazole (ABILIFY) 2 MG tablet Take 1 tablet (2 mg total) by mouth at bedtime. 06/01/20   [name], MD  aspirin EC 81 MG tablet Take 1 tablet (81 mg total) by mouth daily. 06/06/20   Algernon Huxley, MD  B Complex-C (B-COMPLEX WITH VITAMIN C) tablet Take 1 tablet by mouth daily. 05/27/20   Nicole Kindred A, DO  busPIRone (BUSPAR) 10 MG tablet TAKE 2 TABLETS(20 MG) BY MOUTH TWICE  DAILY Patient taking differently: Take 20 mg by mouth 2 (two) times daily.  02/19/20   [name], MD  calcitRIOL (ROCALTROL) 0.25 MCG capsule Take 0.25 mcg by mouth every Monday, Wednesday, and Friday.     [provider]  carvedilol (COREG) 3.125 MG tablet Take 1 tablet (3.125 mg total) by mouth 2 (two) times daily with a meal. 05/26/20   Nicole Kindred A, DO  clopidogrel (PLAVIX) 75 MG tablet Take 1 tablet (75 mg total) by mouth daily. 03/25/20  Sande Rives E, PA-C  CONTOUR TEST test strip USE TO check blood sugar ONCE DAILY AS DIRECTED 05/10/20   Philemon Kingdom, MD  Dextran 70-Hypromellose (ARTIFICIAL TEARS) 0.1-0.3 % SOLN Place 1 drop into both eyes 4 (four) times daily as needed (dry eyes).    [provider]  feeding supplement (ENSURE ENLIVE / ENSURE PLUS) LIQD Take 237 mLs by mouth daily. 05/27/20   Nicole Kindred A, DO  finasteride (PROSCAR) 5 MG tablet Take 5 mg by mouth daily.    [provider]  insulin aspart (NOVOLOG) 100 UNIT/ML injection Inject 0-9 Units into the skin 3 (three) times daily with meals. 03/21/20   Nicole Kindred A, DO  insulin regular (NOVOLIN R) 100 units/mL injection Inject 0.1-0.25 mLs (10-25 Units total) into the skin daily as needed (high blood sugar over 235). 05/19/20   [name], MD  Insulin Syringe-Needle U-100 30G X 15/64" 1 ML MISC Use to inject insulin as directed 05/19/20   [name], MD  isosorbide mononitrate (IMDUR) 30 MG 24 hr tablet Take 1 tablet (30 mg total) by mouth daily. 05/19/20   Wellington Hampshire, MD  nitroGLYCERIN (NITROSTAT) 0.4 MG SL tablet DISSOLVE 1 TABLET UNDER THE TONGUE EVERY 5 MINUTES AS NEEDED FOR CHEST PAIN. DO NOT EXCEED A TOTAL OF 3 DOSES IN 15 MINUTES. Patient taking differently: Place 0.4 mg under the tongue every 5 (five) minutes as needed for chest pain.  04/18/20   Dunn, Areta Haber, PA-C  rosuvastatin (CRESTOR) 10 MG tablet Take 1 tablet (10 mg total) by mouth daily. 04/25/20    Wellington Hampshire, MD  sertraline (ZOLOFT) 100 MG tablet Take 1 tablet (100 mg total) by mouth daily. 02/23/20   [name], MD  tamsulosin (FLOMAX) 0.4 MG CAPS capsule Take 1 capsule by mouth daily Patient taking differently: Take 0.4 mg by mouth daily.  01/20/20   [name], MD  torsemide (DEMADEX) 20 MG tablet Take 3 tablets (60 mg total) by mouth 2 (two) times daily. 05/26/20   Ezekiel Slocumb, DO    Allergies Contrast media [iodinated diagnostic agents], Glipizide, Hydroxychloroquine, Metrizamide, and Penicillins  Family History  Problem Relation Age of Onset  . Lung cancer Father 62  . Arthritis Father   . Breast cancer Sister 55  . Alcoholism Sister   . Dementia Mother   . Alzheimer's disease Mother   . Arthritis Mother   . Alcoholism Brother   . Epilepsy Sister   . Diabetes Paternal Grandfather   . Colon polyps Paternal Grandfather 63  . Alcoholism Sister   . Alcoholism Sister   . Alcoholism Brother     Social History Social History   Tobacco Use  . Smoking status: Former Smoker    Packs/day: 0.25    Years: 40.00    Pack years: 10.00    Types: Cigarettes  . Smokeless tobacco: Never Used  . Tobacco comment: 1 cigarette a day  Vaping Use  . Vaping Use: Never used  Substance Use Topics  . Alcohol use: No  . Drug use: No    Review of Systems  Constitutional: No fever/chills Eyes: No visual changes. ENT: No sore throat. Cardiovascular: Denies chest pain. Respiratory: Denies shortness of breath. Gastrointestinal: No abdominal pain.  No nausea, no vomiting.  No diarrhea.  No constipation. Genitourinary: Negative for dysuria. Musculoskeletal: Negative for back pain.  Positive for bilateral leg pain and swelling, positive for left arm pain. Skin: Negative for rash. Neurological: Negative  for headaches, focal weakness or numbness.  ____________________________________________   PHYSICAL EXAM:  VITAL SIGNS: ED Triage Vitals  Enc Vitals Group      BP 06/11/20 0534 112/68     Pulse Rate 06/11/20 0534 (!) 104     Resp 06/11/20 0534 20     Temp 06/11/20 0534 97.9 F (36.6 C)     Temp Source 06/11/20 0534 Oral     SpO2 06/11/20 0534 99 %     Weight 06/11/20 0533 224 lb 13.9 oz (102 kg)     Height 06/11/20 0533 5\' 6"  (1.676 m)     Head Circumference --      Peak Flow --      Pain Score 06/11/20 0533 10     Pain Loc --      Pain Edu? --      Excl. in [place]? --     Constitutional: Alert and oriented. Eyes: Conjunctivae are normal. Head: Atraumatic. Nose: No congestion/rhinnorhea. Mouth/Throat: Mucous membranes are moist. Neck: Normal ROM Cardiovascular: Normal rate, regular rhythm.  Systolic murmur noted.  Left upper extremity AV fistula with palpable thrill.  No palpable or dopplerable left radial pulse, diminished cap refill in digits of left hand.  Unable to palpate DP or PT pulses bilaterally, Doppler signals noted at both DP and PT. Respiratory: Normal respiratory effort.  No retractions. Lungs CTAB. Gastrointestinal: Soft and nontender. No distention. Genitourinary: deferred Musculoskeletal: Diffuse tenderness to bilateral lower extremities with 1+ pitting edema to knees bilaterally. Neurologic:  Normal speech and language. No gross focal neurologic deficits are appreciated. Skin:  Skin is warm, dry and intact. No rash noted. Psychiatric: Mood and affect are normal. Speech and behavior are normal.  ____________________________________________   LABS (all labs ordered are listed, but only abnormal results are displayed)  Labs Reviewed  CBC WITH DIFFERENTIAL/PLATELET - Abnormal; Notable for the following components:      Result Value   WBC 14.4 (*)    RBC 3.08 (*)    Hemoglobin 8.6 (*)    HCT 27.9 (*)    RDW 15.7 (*)    nRBC 0.8 (*)    Neutro Abs 12.4 (*)    Abs Immature Granulocytes 0.09 (*)    All other components within normal limits  BASIC METABOLIC PANEL - Abnormal; Notable for the following components:    Sodium 133 (*)    Potassium 3.4 (*)    Chloride 92 (*)    Glucose, Bld 211 (*)    BUN 110 (*)    Creatinine, Ser 4.24 (*)    Calcium 8.4 (*)    GFR, Estimated 15 (*)    Anion gap 16 (*)    All other components within normal limits  RESP PANEL BY RT-PCR (FLU A&B, COVID) ARPGX2  PROTIME-INR  URINALYSIS, COMPLETE (UACMP) WITH MICROSCOPIC     PROCEDURES  Procedure(s) performed (including Critical Care):  .Critical Care Performed by: Blake Divine, MD Authorized by: Blake Divine, MD   Critical care provider statement:    Critical care time (minutes):  45   Critical care time was exclusive of:  Separately billable procedures and treating other patients and teaching time   Critical care was necessary to treat or prevent imminent or life-threatening deterioration of the following conditions:  Circulatory failure   Critical care was time spent personally by me on the following activities:  Discussions with consultants, evaluation of patient's response to treatment, examination of patient, ordering and performing treatments and interventions, ordering and review of  laboratory studies, ordering and review of radiographic studies, pulse oximetry, re-evaluation of patient's condition, obtaining history from patient or surrogate and review of old charts   I assumed direction of critical care for this patient from another provider in my specialty: no       ____________________________________________   INITIAL IMPRESSION / ASSESSMENT AND PLAN / ED COURSE       64 year old male with possible history of hypertension, diabetes, CAD status post CABG, aortic stenosis, diastolic CHF, COPD, and CKD approaching ESRD who presents to the ED with bilateral lower extremity pain and swelling along with left upper extremity pain.  He has known history of steal syndrome to his left upper extremity with prior AV fistula placement, upper extremity again appears pale with diminished cap refill.  He  apparently has had prior harvesting of his left radial artery.  Lower extremities with palpable pulses but are significantly tender to touch with associated swelling.  We will further assess with ultrasound of his dialysis access as well as lower extremities for DVT.  Case discussed with Dr. Lucky Cowboy vascular surgery, who reports very poor prognosis given his severe aortic stenosis, impending ESRD, and known steal syndrome to left upper extremity.  He will tentatively plan to address patient's AV fistula on Monday and recommends admission to hospitalist service for pain control.  Lower extremity ultrasound is negative for DVT and ABIs are reassuring, 0.95 on the left and 0.91 on the right.  Lab work does show acute on chronic kidney disease with BUN greater than 110 and uremia could be contributing to his lower extremity pain.  We will give small IV fluid bolus, potassium is within normal limits.  Patient with worsening pain despite IV morphine and we will treat with Dilaudid.  Case discussed with hospitalist for admission.      ____________________________________________   FINAL CLINICAL IMPRESSION(S) / ED DIAGNOSES  Final diagnoses:  Steal syndrome as complication of dialysis access [facility])  Bilateral leg pain     ED Discharge Orders    None       Note:  This document was prepared using Dragon voice recognition software and may include unintentional dictation errors.   Blake Divine, MD 06/11/20 1009

## 2020-06-11 NOTE — Treatment Plan (Addendum)
Pt to unit via stretcher at this time, pt unable to move from stretcher to bed. Max assist at this time. Pt is wet upon arrival and had to give bath, chg completed. Gown placed. Pt is AxOx4. Pt moaning in pain. Pt left arm is cool to touch, cyanosis present, pulses by doppler. Pulse ox taken on ear at this time. Pt states last bm 06-10-20. Incontinent, male purewick placed, peri care provided. Pt has noted bruising generalized, but also to right groin, small open incision to groin. Pt has noted rash to abdomen at this time. Cracking to bilateral feet. Fistula present, bruit and thrill noted. Pt on tele [id number]. Ipad , iphone, shoes, clothing, cane present with patient. Pt has noted +3 edema to bilateral feet, doppler used for pulses. Pt educated to unit and how to call for help, pills taken whole with diet coke. Pt states he usually care for self at home but unable to at this time. Bed alarm on.

## 2020-06-11 NOTE — Progress Notes (Signed)
[name] [name]  MRN: [id number]  DOB/AGE: [birth year] 64 y.o.  Primary Care Physician:Letvak, Theophilus Kinds, MD  Admit date: 06/11/2020  Chief Complaint:  Chief Complaint  Patient presents with  . Foot Pain    S-Pt presented on  06/11/2020 with  Chief Complaint  Patient presents with  . Foot Pain  . [name]  is a 64 y.o. male with a known history of coronary artery disease status post CABG in 2012, moderate aortic stenosis, carotid artery stenosis status post left-sided CEA, type II diabetes on insulin, CKD stage IV, hypertension, hyper hyperlipidemia, PAD status post extensive lower extremity revascularization by surgery, obesity comes to the emergency room with increasing exertional shortness of breath and chest pain.  Nephrology was consulted for comanagement of CKD stage V patient Patient was seen in the ER    History of present illness date back to 2 weeks ago when patient started developing lower extremity pain it has been  Worsening.  Patient also complains of of pain in his s left upper extremity.    Patient was  was recently diagnosed with left upper extremity steal syndrome related to AV fistula and had angioplasty performed 5 days ago with Dr. Lucky Cowboy.  Patient reportss his pain has been unchanged since then and has seemed to worsen acutely over the past couple of days.    Patient gave a history  he was told by Dr. Lucky Cowboy that he might need his left arm amputated.    Patient also complains of increasing lower extremity edema Patient complains of lack of energy Patient complains of malaise Patient complained of decreased appetite  Patient complains of change in taste- Patient added the detail that nothing tastes good   I then discussed the patient about these being nonspecific symptoms of uremia and that he will benefit from starting hemodialysis/renal placement therapy. After excellent discussion patient was willing.  I then discussed the patient that I will contact the  vascular surgeon.      Medications    . ARIPiprazole  2 mg Oral QHS  . aspirin EC  81 mg Oral Daily  . B-complex with vitamin C  1 tablet Oral Daily  . busPIRone  20 mg Oral BID  . [START ON 06/13/2020] calcitRIOL  0.25 mcg Oral Q M,W,F  . carvedilol  3.125 mg Oral BID WC  . feeding supplement  237 mL Oral Q24H  . finasteride  5 mg Oral Daily  . gabapentin  100 mg Oral QHS  . heparin  5,000 Units Subcutaneous Q8H  . insulin aspart  0-5 Units Subcutaneous QHS  . insulin aspart  0-9 Units Subcutaneous TID WC  . isosorbide mononitrate  30 mg Oral Daily  . nicotine  21 mg Transdermal Daily  . rosuvastatin  10 mg Oral Daily  . sertraline  100 mg Oral Daily  . tamsulosin  0.4 mg Oral Daily         FHL:KTGYB from the symptoms mentioned above,there are no other symptoms referable to all systems reviewed.  Physical Exam: Vital signs in last 24 hours: Temp:  [97.9 F (36.6 C)] 97.9 F (36.6 C) (11/20 1139) Pulse Rate:  [94-104] 97 (11/20 1139) Resp:  [18-20] 18 (11/20 1139) BP: (112-157)/(67-73) 128/73 (11/20 1139) SpO2:  [93 %-100 %] 93 % (11/20 1139) Weight:  [102 kg-107.3 kg] 107.3 kg (11/20 1139) Weight change:  Last BM Date: 06/10/20  Intake/Output from previous day: No intake/output data recorded. No intake/output data recorded.   Physical Exam:  General- pt is awake,alert, oriented to time place and person. HEENT- normocephalic, atraumatic . Resp- No acute REsp distress,Rhonchi at bases. CVS- S1S2 regular in rate and rhythm. GIT- BS+, soft, NT, ND. EXT-2+ LE Edema, Cyanosis CNS-CN 2-12 grossly intact  Access Left AVF + bruit  Lab Results: CBC Recent Labs    06/11/20 0830  WBC 14.4*  HGB 8.6*  HCT 27.9*  PLT 259    BMET Recent Labs    06/11/20 0830  NA 133*  K 3.4*  CL 92*  CO2 25  GLUCOSE 211*  BUN 110*  CREATININE 4.24*  CALCIUM 8.4*    Creatinine trend. 2021 4.2 4.2--4.6 during August admission 4.2  probably new  baseline 2.1--4.4 AKI during late July admission 2020 2.7--3.1 2019 2.8 2018 2.0 2017 1.5--1.6 2016 1.4--1.8 2015 1.4--1.8 2014 1.3--1.5 2013 1.2--1.4 2012 1.0--1.5  MICRO Recent Results (from the past 240 hour(s))  SARS CORONAVIRUS 2 (TAT 6-24 HRS) Nasopharyngeal Nasopharyngeal Swab     Status: None   Collection Time: 06/03/20 10:25 AM   Specimen: Nasopharyngeal Swab  Result Value Ref Range Status   SARS Coronavirus 2 NEGATIVE NEGATIVE Final    Comment: (NOTE) SARS-CoV-2 target nucleic acids are NOT DETECTED.  The SARS-CoV-2 RNA is generally detectable in upper and lower respiratory specimens during the acute phase of infection. Negative results do not preclude SARS-CoV-2 infection, do not rule out co-infections with other pathogens, and should not be used as the sole basis for treatment or other patient management decisions. Negative results must be combined with clinical observations, patient history, and epidemiological information. The expected result is Negative.  Fact Sheet for Patients: SugarRoll.be  Fact Sheet for Healthcare Providers: https://www.woods-mathews.com/  This test is not yet approved or cleared by the [place] FDA and  has been authorized for detection and/or diagnosis of SARS-CoV-2 by FDA under an Emergency Use Authorization (EUA). This EUA will remain  in effect (meaning this test can be used) for the duration of the COVID-19 declaration under Se ction 564(b)(1) of the Act, 21 U.S.C. section 360bbb-3(b)(1), unless the authorization is terminated or revoked sooner.  Performed at [facility], [place] [address], [place], [place] [address]   Resp Panel by RT-PCR (Flu A&B, Covid) Nasopharyngeal Swab     Status: None   Collection Time: 06/11/20  8:30 AM   Specimen: Nasopharyngeal Swab; Nasopharyngeal(NP) swabs in vial transport medium  Result Value Ref Range Status   SARS Coronavirus 2 by RT PCR  NEGATIVE NEGATIVE Final    Comment: (NOTE) SARS-CoV-2 target nucleic acids are NOT DETECTED.  The SARS-CoV-2 RNA is generally detectable in upper respiratory specimens during the acute phase of infection. The lowest concentration of SARS-CoV-2 viral copies this assay can detect is 138 copies/mL. A negative result does not preclude SARS-Cov-2 infection and should not be used as the sole basis for treatment or other patient management decisions. A negative result may occur with  improper specimen collection/handling, submission of specimen other than nasopharyngeal swab, presence of viral mutation(s) within the areas targeted by this assay, and inadequate number of viral copies(<138 copies/mL). A negative result must be combined with clinical observations, patient history, and epidemiological information. The expected result is Negative.  Fact Sheet for Patients:  EntrepreneurPulse.com.au  Fact Sheet for Healthcare Providers:  IncredibleEmployment.be  This test is no t yet approved or cleared by the [place] FDA and  has been authorized for detection and/or diagnosis of SARS-CoV-2 by FDA under an Emergency Use Authorization (EUA).  This EUA will remain  in effect (meaning this test can be used) for the duration of the COVID-19 declaration under Section 564(b)(1) of the Act, 21 U.S.C.section 360bbb-3(b)(1), unless the authorization is terminated  or revoked sooner.       Influenza A by PCR NEGATIVE NEGATIVE Final   Influenza B by PCR NEGATIVE NEGATIVE Final    Comment: (NOTE) The Xpert Xpress SARS-CoV-2/FLU/RSV plus assay is intended as an aid in the diagnosis of influenza from Nasopharyngeal swab specimens and should not be used as a sole basis for treatment. Nasal washings and aspirates are unacceptable for Xpert Xpress SARS-CoV-2/FLU/RSV testing.  Fact Sheet for Patients: EntrepreneurPulse.com.au  Fact Sheet for  Healthcare Providers: IncredibleEmployment.be  This test is not yet approved or cleared by the [place] FDA and has been authorized for detection and/or diagnosis of SARS-CoV-2 by FDA under an Emergency Use Authorization (EUA). This EUA will remain in effect (meaning this test can be used) for the duration of the COVID-19 declaration under Section 564(b)(1) of the Act, 21 U.S.C. section 360bbb-3(b)(1), unless the authorization is terminated or revoked.  Performed at [facility], [address] [place], [place] [address]       Lab Results  Component Value Date   CALCIUM 8.4 (L) 06/11/2020   CAION 1.06 (L) 07/02/2016   PHOS 7.6 (H) 06/01/2020        Chest x-ray done this-March 20, 2020  IMPRESSION: Diffuse interstitial opacities suggestive of pulmonary edema given cardiomegaly. Atypical infection could have a similar appearance. Recommend clinical correlation.  2D echo done on June 21st 2021 1. Left ventricular ejection fraction, by estimation, is 50 to 55%. The  left ventricle has low normal function. The left ventricle has no regional  wall motion abnormalities. There is mild left ventricular hypertrophy.  Left ventricular diastolic  parameters are consistent with Grade II diastolic dysfunction  (pseudonormalization).   Impression:    Patient is a 64 year old Caucasian male with a past medical history of coronary artery disease, status post CABG in 2012, moderate aortic stenosis, carotid artery stenosis status post left-sided CEA, diabetes mellitus type 2 , CKD stage IV, hypertension, hyperlipidemia, PAD status post extensive lower extremity revascularization by surgery, obesity came to the emergency room with increasing left upper extremity pain, bilateral feet pain and lower extremity swelling    . 1)Renal    Patient has CKD stage V Patient has CKD since 2013 Patient has CKD most likely secondary to multiple  factors Patient has CKD from diabetes mellitus Possible contribution from hypertension/history of multiple AKI/cardiorenal syndrome  Patient BUN is now more than 100 Patient hemoglobin is better than the last admission.   Patient has known CKD stage V Patient has moderate uremic symptoms I discussed with the patient about need to start him on renal placement therapy Patient is willing to start dialysis I then discussed this with the vascular surgeon/the primary team about need for access placement. Patient is most likely going to get tunneled catheter placed on Monday. Patient will be continued on diuretics   2)HTN Blood pressure at goal   3)Anemia of chronic disease  HGb is not at goal (9--11)    4) secondary hyperparathyroidism -CKD Mineral-Bone Disorder   Secondary Hyperparathyroidism present Patient intact PTH level reviewed.patient was on the higher side-118 on August 19  Phosphorus at goal.   5) left upper extremity pain/steal syndrome Patient had fistula placed on May 24, 2020 Patient thereafter developed steal syndrome. Patient underwent angioplasty on June 06, 2020. Plan is to ligate the fistula Primary team and vascular surgery is following  6) electrolytes   sodium Normonatremic   potassium Normokalemic    7)Acid base Co2 at goal  8)Chronic diastolic CHF Patient admitted with fluid overload Patient is on diuretics      Plan:  Patient has known CKD stage V Patient has moderate uremic symptoms I discussed with the patient about need to start him on renal placement therapy Patient is willing to start dialysis I then discussed this with the vascular surgeon/the primary team about need for access placement. Patient is most likely going to get tunneled catheter placed on Monday. Patient will be continued on diuretics       Lycia Sachdeva s [facility] 06/11/2020, 2:54 PM

## 2020-06-11 NOTE — ED Notes (Signed)
This RN and EDT at bedside. Pt repositioned in bed and pulse ox placed back on ear. Pt given diet coke per request at this time.

## 2020-06-11 NOTE — ED Notes (Signed)
Patient to hospital via EMS for chronic pain to bilateral feet and left arm.  EMS reports this episode began on Monday.  EMS vitals -- hr 97, bp 136/60, pulse oxi 97% on room air, temp 98.4, cbg 102.

## 2020-06-11 NOTE — ED Triage Notes (Signed)
Patient with bilateral foot pain and left arm pain.

## 2020-06-11 NOTE — H&P (Signed)
History and Physical    NOBEL BRAR CWC:[id number] DOB: [birth year] DOA: 06/11/2020  Referring MD/NP/PA:   PCP: [name], MD   Patient coming from:  The patient is coming from home.  At baseline, pt is independent for most of ADL.        Chief Complaint: left arm pain and bilateral foot pain  HPI: [name] [name] is a 64 y.o. 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  Patient had AVF placed in left arm. He developed ischemia with a large patent left brachiocephalic AV fistula creating steal syndrome. Pt has been treated by [facility]. He was recently treated for left upper extremity ischemia with 3 separate intervention sites. Patient states that his left arm pain did not improve.  He continues to have constant, sharp, severe pain in the left arm. Pt also has bilateral feet, which is burning pain. He has exertional shortness of breath.  Denies cough, fever or chills.  No nausea vomiting or diarrhea, abdominal pain, symptoms of UTI.   ED Course: pt was found to have WBC 14.4, INR 1.1, negative Covid PCR, potassium 3.4, creatinine is up from 3.61 on 06/02/20 to 4.24, BUN 110 today, temperature normal, blood pressure 112/68, heart rate 104, RR 20, oxygen saturation 99% on room air.  Lower extremity Dopplers negative for DVT.  Patient is admitted to [facility] as inpatient.  Dr. Lucky Cowboy of [facility] is consulted.  Dr. Theador Hawthorne of renal is consulted.  Review of Systems:   General: no fevers, chills, no body weight gain, has fatigue HEENT: no blurry vision, hearing changes or sore throat Respiratory: has dyspnea,  No coughing, wheezing CV: no chest pain, no  palpitations GI: no nausea, vomiting, abdominal pain, diarrhea, constipation GU: no dysuria, burning on urination, increased urinary frequency, hematuria  Ext: has leg edema.  Neuro: no unilateral weakness, numbness, or tingling, no vision change or hearing loss Skin: no rash, no skin tear. MSK: has left arm and bilateral foot pain. Heme: No easy bruising.  Travel history: No recent long distant travel.  Allergy:  Allergies  Allergen Reactions  . Contrast Media [Iodinated Diagnostic Agents] Rash and Other (See Comments)    Got very hot and red   . Glipizide Other (See Comments)    ANTIDIABETICS. Burning  . Hydroxychloroquine Other (See Comments)    Stomach upset  . Metrizamide Other (See Comments)    Got very hot and red  . Penicillins Hives and Swelling    Has patient had a PCN reaction causing immediate rash, facial/tongue/throat swelling, SOB or lightheadedness with hypotension: yes Has patient had a PCN reaction causing severe rash involving mucus membranes or skin necrosis: no  Has patient had a PCN reaction that required hospitalization: yes Has patient had a PCN reaction occurring within the last 10 years: no If all of the above answers are "NO", then may proceed with Cephalosporin use.     Past Medical History:  Diagnosis Date  . 3-vessel CAD    8/30 LHC with radial graft to RCA occluded at the ostium with critical stenosis of the ostial native RCA.  . Adenomatous colon polyp   . Allergy   . Anemia   . Anxiety   .  Arthritis    RA  . Asthma   . Bell's palsy 2007   neuropathy  . CAD (coronary artery disease)    03/21/2020 LHC with patent LIMA to LAD and SVG to OM 3.  Radial graft to RCA occluded at the ostium with critical stenosis of the ostial and native RCA.  . Carotid artery occlusion    Bilateral carotid stenosis s/p left-sided CEA.  . Cataract    Dr. Dawna Part  . CKD (chronic kidney disease), stage IV ([place])    03/2020 vascular surgery access planned  .  Depression   . Diabetes mellitus   . Diverticulosis   . Duodenitis   . DVT (deep venous thrombosis) (HCC)    in leg  . Dyspnea    with edema  . ESRD (end stage renal disease) ([place])   . Gastropathy   . GERD (gastroesophageal reflux disease)   . Heart failure with preserved ejection fraction ([place])    8/30 RHC with moderately to severely elevated filling pressures and pulmonary wedge pressure 31 mmHg, moderate pulmonary hypertension  . Heart murmur   . Helicobacter pylori gastritis   . Hx of CABG    2012  . Hyperlipidemia   . Hypertension   . Mitral regurgitation and aortic stenosis    RHC 8/30 and echo 12/2019.  [name] Myocardial infarction ([place]) 2021  . Neuropathy   . Obesity   . Peripheral vascular disease ([place])    S/p extensive revascularization by vascular surgery 05/2019  . Post splenectomy syndrome   . Psoriasis   . Severe aortic stenosis    03/21/20 RHC with peak gradient 27.4 mmHg and valve area 0.87.  [name] Sleep apnea    uses cpap  . Stroke ([employer])   . Tobacco use disorder    recently quit  . Tubular adenoma 08/2013   Dr. Hilarie Fredrickson  . Weak urinary stream     Past Surgical History:  Procedure Laterality Date  . AV FISTULA PLACEMENT N/A 05/24/2020   Procedure: ARTERIOVENOUS (AV) FISTULA CREATION (Brachio-cephalic);  Surgeon: Algernon Huxley, MD;  Location: [facility] ORS;  Service: Vascular;  Laterality: N/A;  . CARDIAC CATHETERIZATION  08/2010   LAD: 80% ISR, RCA: 80% ostial, OM 80-90%  . CAROTID ENDARTERECTOMY  08/2010   left/ Dr. Kellie Simmering  . CATARACT EXTRACTION W/PHACO Left 07/09/2017   Procedure: CATARACT EXTRACTION PHACO AND INTRAOCULAR LENS PLACEMENT (IOC);  Surgeon: Birder Robson, MD;  Location: [facility] ORS;  Service: Ophthalmology;  Laterality: Left;  [place] 00:23 AP% 14.2 CDE 3.26 Fluid pack lot # [id number]  . CATARACT EXTRACTION W/PHACO Right 09/10/2017   Procedure: CATARACT EXTRACTION PHACO AND INTRAOCULAR LENS PLACEMENT (IOC);  Surgeon: Birder Robson, MD;  Location: [facility]  ORS;  Service: Ophthalmology;  Laterality: Right;  [place] 00:26.4 AP% 17.3 CDE 4.59 Fluid Pack Lot # [id number]  . COLONOSCOPY    . CORONARY ANGIOPLASTY     LAD: before CABG  . CORONARY ARTERY BYPASS GRAFT  09/06/2010   At [facility]: LIMA to LAD, left radial to RCA, sequential SVG to OM3 and 4  . CORONARY STENT INTERVENTION N/A 03/23/2020   Procedure: CORONARY STENT INTERVENTION;  Surgeon: Wellington Hampshire, MD;  Location: [place] [facility] LAB;  Service: Cardiovascular;  Laterality: N/A;  RCA  . ESOPHAGOGASTRODUODENOSCOPY (EGD) WITH PROPOFOL N/A 04/11/2020   Procedure: ESOPHAGOGASTRODUODENOSCOPY (EGD) WITH PROPOFOL;  Surgeon: Lucilla Lame, MD;  Location: [facility] ENDOSCOPY;  Service: Endoscopy;  Laterality: N/A;  . heart stents  Jan 2011   leg stents 06/2009 and  03/2010  . LOWER EXTREMITY ANGIOGRAPHY Left 06/09/2019   Procedure: LOWER EXTREMITY ANGIOGRAPHY;  Surgeon: Katha Cabal, MD;  Location: [place] CV LAB;  Service: Cardiovascular;  Laterality: Left;  . PTA of illiac and SFA  multiple   Dr. Ronalee Belts, s/p revision 11/2012  . RIGHT AND LEFT HEART CATH N/A 03/21/2020   Procedure: RIGHT AND LEFT HEART CATH possible percutaneous intervention;  Surgeon: Wellington Hampshire, MD;  Location: [place] [facility] LAB;  Service: Cardiovascular;  Laterality: N/A;  . RIGHT/LEFT HEART CATH AND CORONARY/GRAFT ANGIOGRAPHY N/A 02/15/2020   Procedure: RIGHT/LEFT HEART CATH AND CORONARY/GRAFT ANGIOGRAPHY;  Surgeon: Wellington Hampshire, MD;  Location: [place] CV LAB;  Service: Cardiovascular;  Laterality: N/A;  . SPLENECTOMY    . UPPER EXTREMITY ANGIOGRAPHY Left 06/06/2020   Procedure: UPPER EXTREMITY ANGIOGRAPHY;  Surgeon: Algernon Huxley, MD;  Location: [place] CV LAB;  Service: Cardiovascular;  Laterality: Left;  [name] VASCULAR SURGERY     LEG STENTS    Social History:  reports that he has quit smoking. His smoking use included cigarettes. He has a 10.00 pack-year smoking history. He has never used smokeless  tobacco. He reports that he does not drink alcohol and does not use drugs.  Family History:  Family History  Problem Relation Age of Onset  . Lung cancer Father 57  . Arthritis Father   . Breast cancer Sister 63  . Alcoholism Sister   . Dementia Mother   . Alzheimer's disease Mother   . Arthritis Mother   . Alcoholism Brother   . Epilepsy Sister   . Diabetes Paternal Grandfather   . Colon polyps Paternal Grandfather 8  . Alcoholism Sister   . Alcoholism Sister   . Alcoholism Brother      Prior to Admission medications   Medication Sig Start Date End Date Taking? Authorizing Provider  acetaminophen (TYLENOL) 500 MG tablet Take 500 mg by mouth every 6 (six) hours as needed for moderate pain or headache.    [provider]  albuterol (PROAIR HFA) 108 (90 Base) MCG/ACT inhaler Inhale 2 puffs into the lungs every 6 (six) hours as needed for shortness of breath. 06/01/20   [name], MD  alum & mag hydroxide-simeth (MAALOX/MYLANTA) 200-200-20 MG/5ML suspension Take 30 mLs by mouth every 6 (six) hours as needed for indigestion or heartburn.    [provider]  ARIPiprazole (ABILIFY) 2 MG tablet Take 1 tablet (2 mg total) by mouth at bedtime. 06/01/20   [name], MD  aspirin EC 81 MG tablet Take 1 tablet (81 mg total) by mouth daily. 06/06/20   Algernon Huxley, MD  B Complex-C (B-COMPLEX WITH VITAMIN C) tablet Take 1 tablet by mouth daily. 05/27/20   Nicole Kindred A, DO  busPIRone (BUSPAR) 10 MG tablet TAKE 2 TABLETS(20 MG) BY MOUTH TWICE DAILY Patient taking differently: Take 20 mg by mouth 2 (two) times daily.  02/19/20   [name], MD  calcitRIOL (ROCALTROL) 0.25 MCG capsule Take 0.25 mcg by mouth every Monday, Wednesday, and Friday.     [provider]  carvedilol (COREG) 3.125 MG tablet Take 1 tablet (3.125 mg total) by mouth 2 (two) times daily with a meal. 05/26/20   Nicole Kindred A, DO  clopidogrel (PLAVIX) 75 MG tablet Take 1 tablet  (75 mg total) by mouth daily. 03/25/20   Sande Rives E, PA-C  CONTOUR TEST test strip USE TO check blood sugar ONCE DAILY AS DIRECTED 05/10/20  Philemon Kingdom, MD  Dextran 70-Hypromellose (ARTIFICIAL TEARS) 0.1-0.3 % SOLN Place 1 drop into both eyes 4 (four) times daily as needed (dry eyes).    [provider]  feeding supplement (ENSURE ENLIVE / ENSURE PLUS) LIQD Take 237 mLs by mouth daily. 05/27/20   Nicole Kindred A, DO  finasteride (PROSCAR) 5 MG tablet Take 5 mg by mouth daily.    [provider]  insulin aspart (NOVOLOG) 100 UNIT/ML injection Inject 0-9 Units into the skin 3 (three) times daily with meals. 03/21/20   Nicole Kindred A, DO  insulin regular (NOVOLIN R) 100 units/mL injection Inject 0.1-0.25 mLs (10-25 Units total) into the skin daily as needed (high blood sugar over 235). 05/19/20   [name], MD  Insulin Syringe-Needle U-100 30G X 15/64" 1 ML MISC Use to inject insulin as directed 05/19/20   [name], MD  isosorbide mononitrate (IMDUR) 30 MG 24 hr tablet Take 1 tablet (30 mg total) by mouth daily. 05/19/20   Wellington Hampshire, MD  nitroGLYCERIN (NITROSTAT) 0.4 MG SL tablet DISSOLVE 1 TABLET UNDER THE TONGUE EVERY 5 MINUTES AS NEEDED FOR CHEST PAIN. DO NOT EXCEED A TOTAL OF 3 DOSES IN 15 MINUTES. Patient taking differently: Place 0.4 mg under the tongue every 5 (five) minutes as needed for chest pain.  04/18/20   Dunn, Areta Haber, PA-C  rosuvastatin (CRESTOR) 10 MG tablet Take 1 tablet (10 mg total) by mouth daily. 04/25/20   Wellington Hampshire, MD  sertraline (ZOLOFT) 100 MG tablet Take 1 tablet (100 mg total) by mouth daily. 02/23/20   [name], MD  tamsulosin (FLOMAX) 0.4 MG CAPS capsule Take 1 capsule by mouth daily Patient taking differently: Take 0.4 mg by mouth daily.  01/20/20   [name], MD  torsemide (DEMADEX) 20 MG tablet Take 3 tablets (60 mg total) by mouth 2 (two) times daily. 05/26/20   Ezekiel Slocumb, DO     Physical Exam: Vitals:   06/11/20 1024 06/11/20 1030 06/11/20 1045 06/11/20 1100  BP: (!) 155/73 (!) 157/67    Pulse: 97 94 96 98  Resp: 20   20  Temp:      TempSrc:      SpO2: 100% 95% 100% 100%  Weight:      Height:       General: Not in acute distress HEENT:       Eyes: PERRL, EOMI, no scleral icterus.       ENT: No discharge from the ears and nose, no pharynx injection, no tonsillar enlargement.        Neck: No JVD, no bruit, no mass felt. Heme: No neck lymph node enlargement. Cardiac: S1/S2, RRR, No murmurs, No gallops or rubs. Respiratory: No rales, wheezing, rhonchi or rubs. GI: Soft, nondistended, nontender, no rebound pain, no organomegaly, BS present. GU: No hematuria Ext: 2+ pitting leg edema bilaterally. Both feet are warm. Musculoskeletal: has tenderness in the left arm and bilateral feet. Skin: No rashes.  Neuro: Alert, oriented X3, cranial nerves II-XII grossly intact, moves all extremities normally. Psych: Patient is not psychotic, no suicidal or hemocidal ideation.  Labs on Admission: I have personally reviewed following labs and imaging studies  CBC: Recent Labs  Lab 06/11/20 0830  WBC 14.4*  NEUTROABS 12.4*  HGB 8.6*  HCT 27.9*  MCV 90.6  PLT 287   Basic Metabolic Panel: Recent Labs  Lab 06/11/20 0830  NA 133*  K 3.4*  CL 92*  CO2  25  GLUCOSE 211*  BUN 110*  CREATININE 4.24*  CALCIUM 8.4*   GFR: Estimated Creatinine Clearance: 19.2 mL/min (A) (by C-G formula based on SCr of 4.24 mg/dL (H)). Liver Function Tests: No results for input(s): AST, ALT, ALKPHOS, BILITOT, PROT, ALBUMIN in the last 168 hours. No results for input(s): LIPASE, AMYLASE in the last 168 hours. No results for input(s): AMMONIA in the last 168 hours. Coagulation Profile: Recent Labs  Lab 06/11/20 0830  INR 1.1   Cardiac Enzymes: No results for input(s): CKTOTAL, CKMB, CKMBINDEX, TROPONINI in the last 168 hours. BNP (last 3 results) No results for input(s):  PROBNP in the last 8760 hours. HbA1C: No results for input(s): HGBA1C in the last 72 hours. CBG: Recent Labs  Lab 06/06/20 1557  GLUCAP 161*   Lipid Profile: No results for input(s): CHOL, HDL, LDLCALC, TRIG, CHOLHDL, LDLDIRECT in the last 72 hours. Thyroid Function Tests: No results for input(s): TSH, T4TOTAL, FREET4, T3FREE, THYROIDAB in the last 72 hours. Anemia Panel: No results for input(s): VITAMINB12, FOLATE, FERRITIN, TIBC, IRON, RETICCTPCT in the last 72 hours. Urine analysis:    Component Value Date/Time   COLORURINE YELLOW (A) 02/08/2020 1106   APPEARANCEUR CLEAR (A) 02/08/2020 1106   APPEARANCEUR Clear 06/13/2015 1012   LABSPEC 1.011 02/08/2020 1106   PHURINE 7.0 02/08/2020 1106   GLUCOSEU NEGATIVE 02/08/2020 1106   HGBUR NEGATIVE 02/08/2020 1106   BILIRUBINUR NEGATIVE 02/08/2020 1106   BILIRUBINUR Negative 06/13/2015 1012   KETONESUR NEGATIVE 02/08/2020 1106   PROTEINUR 100 (A) 02/08/2020 1106   UROBILINOGEN 0.2 12/21/2014 1336   NITRITE NEGATIVE 02/08/2020 1106   LEUKOCYTESUR NEGATIVE 02/08/2020 1106   Sepsis Labs: @LABRCNTIP (procalcitonin:4,lacticidven:4) ) Recent Results (from the past 240 hour(s))  SARS CORONAVIRUS 2 (TAT 6-24 HRS) Nasopharyngeal Nasopharyngeal Swab     Status: None   Collection Time: 06/03/20 10:25 AM   Specimen: Nasopharyngeal Swab  Result Value Ref Range Status   SARS Coronavirus 2 NEGATIVE NEGATIVE Final    Comment: (NOTE) SARS-CoV-2 target nucleic acids are NOT DETECTED.  The SARS-CoV-2 RNA is generally detectable in upper and lower respiratory specimens during the acute phase of infection. Negative results do not preclude SARS-CoV-2 infection, do not rule out co-infections with other pathogens, and should not be used as the sole basis for treatment or other patient management decisions. Negative results must be combined with clinical observations, patient history, and epidemiological information. The expected result is  Negative.  Fact Sheet for Patients: SugarRoll.be  Fact Sheet for Healthcare Providers: https://www.woods-mathews.com/  This test is not yet approved or cleared by the [place] FDA and  has been authorized for detection and/or diagnosis of SARS-CoV-2 by FDA under an Emergency Use Authorization (EUA). This EUA will remain  in effect (meaning this test can be used) for the duration of the COVID-19 declaration under Se ction 564(b)(1) of the Act, 21 U.S.C. section 360bbb-3(b)(1), unless the authorization is terminated or revoked sooner.  Performed at [facility], [name] [address], [place], [place] [address]   Resp Panel by RT-PCR (Flu A&B, Covid) Nasopharyngeal Swab     Status: None   Collection Time: 06/11/20  8:30 AM   Specimen: Nasopharyngeal Swab; Nasopharyngeal(NP) swabs in vial transport medium  Result Value Ref Range Status   SARS Coronavirus 2 by RT PCR NEGATIVE NEGATIVE Final    Comment: (NOTE) SARS-CoV-2 target nucleic acids are NOT DETECTED.  The SARS-CoV-2 RNA is generally detectable in upper respiratory specimens during the acute phase of infection.  The lowest concentration of SARS-CoV-2 viral copies this assay can detect is 138 copies/mL. A negative result does not preclude SARS-Cov-2 infection and should not be used as the sole basis for treatment or other patient management decisions. A negative result may occur with  improper specimen collection/handling, submission of specimen other than nasopharyngeal swab, presence of viral mutation(s) within the areas targeted by this assay, and inadequate number of viral copies(<138 copies/mL). A negative result must be combined with clinical observations, patient history, and epidemiological information. The expected result is Negative.  Fact Sheet for Patients:  EntrepreneurPulse.com.au  Fact Sheet for Healthcare Providers:   IncredibleEmployment.be  This test is no t yet approved or cleared by the [place] FDA and  has been authorized for detection and/or diagnosis of SARS-CoV-2 by FDA under an Emergency Use Authorization (EUA). This EUA will remain  in effect (meaning this test can be used) for the duration of the COVID-19 declaration under Section 564(b)(1) of the Act, 21 U.S.C.section 360bbb-3(b)(1), unless the authorization is terminated  or revoked sooner.       Influenza A by PCR NEGATIVE NEGATIVE Final   Influenza B by PCR NEGATIVE NEGATIVE Final    Comment: (NOTE) The Xpert Xpress SARS-CoV-2/FLU/RSV plus assay is intended as an aid in the diagnosis of influenza from Nasopharyngeal swab specimens and should not be used as a sole basis for treatment. Nasal washings and aspirates are unacceptable for Xpert Xpress SARS-CoV-2/FLU/RSV testing.  Fact Sheet for Patients: EntrepreneurPulse.com.au  Fact Sheet for Healthcare Providers: IncredibleEmployment.be  This test is not yet approved or cleared by the [place] FDA and has been authorized for detection and/or diagnosis of SARS-CoV-2 by FDA under an Emergency Use Authorization (EUA). This EUA will remain in effect (meaning this test can be used) for the duration of the COVID-19 declaration under Section 564(b)(1) of the Act, 21 U.S.C. section 360bbb-3(b)(1), unless the authorization is terminated or revoked.  Performed at [facility], [address] [place], [place] [address]      Radiological Exams on Admission: US Venous Img Lower Bilateral  Result Date: 06/11/2020 CLINICAL DATA:  Bilateral lower extremity pain and swelling. EXAM: BILATERAL LOWER EXTREMITY VENOUS DOPPLER ULTRASOUND TECHNIQUE: Gray-scale sonography with compression, as well as color and duplex ultrasound, were performed to evaluate the deep venous system(s) from the level of the common femoral vein  through the popliteal and proximal calf veins. COMPARISON:  None. FINDINGS: BILATERAL LOWER EXTREMITY VENOUS: Normal compressibility of the common femoral, superficial femoral, and popliteal veins, as well as the visualized calf veins. Visualized portions of profunda femoral vein and great saphenous vein unremarkable. No filling defects to suggest DVT on grayscale or color Doppler imaging. Doppler waveforms show normal direction of venous flow, normal respiratory plasticity and response to augmentation. OTHER None. Limitations: Patient movement and mild subcutaneous edema. IMPRESSION: Negative for DVT bilateral lower extremities. Electronically Signed   By: Marin Olp M.D.   On: 06/11/2020 09:29     EKG: Not done in ED, will get one.   Assessment/Plan Principal Problem:   Left arm pain-due to ischemia secondary to steal syndrome Active Problems:   Hyperlipidemia, mixed   Essential hypertension   PVD (peripheral vascular disease) (HCC)   Type II diabetes mellitus with renal manifestations (HCC)   Chronic heart failure with preserved ejection fraction (HFpEF) (HCC)   CAD (coronary artery disease)   CKD (chronic kidney disease), stage IV (HCC)   Depression   Anemia in chronic kidney disease  COPD (chronic obstructive pulmonary disease) (HCC)   Stroke (HCC)   Hypokalemia   Leukocytosis   Combined congestive systolic and diastolic heart failure (HCC)   Left arm pain-due to ischemia secondary to AVF-related steal syndrome and hx of PVD: Dr. Lucky Cowboy [facility] is consulted. Dr. Lucky Cowboy is planning to ligate or coil his fistula on Mondday.  -will admit to med-surg bed as inpt -Pain control: As needed Percocet, morphine, Tylenol -Check INR, PTT -on aspirin, Plavix, Crestor -pt will need to n.p.o. after midnight of tomorrow  Bilateral leg pain: Lower extremity Doppler is negative for DVT. Per Dr.Dew's note, " recent arterial studies were fairly normal".  Both feet are warm on examination. -Pain control  as above -Start Neurontin empirically  Hyperlipidemia, mixed -crestor  Essential hypertension -IV hydralazine as needed -Coreg  Type II diabetes mellitus with renal manifestations ([place]): Recent A1c 5.7, well controlled.  Patient is taking Novolin at home -SSI  Combined congestive systolic and diastolic heart failure: 2D echo on 03/22/2020 showed EF of 30-35% with grade 2 diastolic dysfunction.  Patient has exertional shortness of breath and 2+ bilateral leg edema, indicating fluid overload.  Currently oxygen saturation is okay at 99% on room air, but he is at risk for developing CHF exacerbation.  Patient is on torsemide 60 mg twice daily at home -Start Lasix 120 mg twice daily (I discussed with Dr. Theador Hawthorne who agreed)  CAD (coronary artery disease): No CP -Continue aspirin, Crestor, Coreg, Imdur -As needed nitroglycerin  CKD (chronic kidney disease), stage IV-V: Patient likely needs to start dialysis -Dr. Theador Hawthorne of renal is consulted.   Depression: Denies suicidal homicidal ideations -Continue home meds  Anemia in chronic kidney disease: Hgb 7.9 on 06/02/20 -->8.6 -f/u by CBC  COPD (chronic obstructive pulmonary disease) ([place]): No wheezing or rhonchi on auscultation -Continue bronchodilators  Stroke (HCC) -on aspirin, Crestor -hold plavix for sugery  Hypokalemia: K 3.4 -given 20 mEq of KCl -check Mg level  Leukocytosis: WBC 14.4. No fever, no source of infection identified, likely reactive -Follow-up urinalysis -Follow-up by CBC  BPH: -Proscar and Flomax       DVT ppx: SQ Heparin (this is to be on hold tomorrow after MN for surgery on Monday) Code Status: Full code Family Communication: not done, no family member is at bed side.   Disposition Plan:  Anticipate discharge back to previous environment Consults called:  Dr. Lucky Cowboy of [facility] and Dr. Theador Hawthorne of renal Admission status: Med-surg bed as inpt      Status is: Inpatient  Remains inpatient appropriate  because:Inpatient level of care appropriate due to severity of illness.  Patient has multiple comorbidities, now presents with left arm pain due to ischemia secondary to AV fistula related steal syndrome.  Patient will need surgery on Monday per [facility].  His presentation is highly complicated.  Patient is at high risk of deteriorating.  Will need to be treated in the hospital for at least 2 days.   Dispo: The patient is from: Home              Anticipated d/c is to: Home              Anticipated d/c date is: 2 days              Patient currently is not medically stable to d/c.          Date of Service 06/11/2020    Ivor Costa [facility]   If 7PM-7AM, please contact night-coverage [email or web address]  06/11/2020, 11:14 AM

## 2020-06-12 DIAGNOSIS — M79602 Pain in left arm: Secondary | ICD-10-CM | POA: Diagnosis not present

## 2020-06-12 DIAGNOSIS — D631 Anemia in chronic kidney disease: Secondary | ICD-10-CM | POA: Diagnosis not present

## 2020-06-12 DIAGNOSIS — J449 Chronic obstructive pulmonary disease, unspecified: Secondary | ICD-10-CM | POA: Diagnosis not present

## 2020-06-12 DIAGNOSIS — N184 Chronic kidney disease, stage 4 (severe): Secondary | ICD-10-CM | POA: Diagnosis not present

## 2020-06-12 LAB — IRON AND TIBC
Iron: 20 ug/dL — ABNORMAL LOW (ref 45–182)
Saturation Ratios: 5 % — ABNORMAL LOW (ref 17.9–39.5)
TIBC: 384 ug/dL (ref 250–450)
UIBC: 364 ug/dL

## 2020-06-12 LAB — VITAMIN B12: Vitamin B-12: 1441 pg/mL — ABNORMAL HIGH (ref 180–914)

## 2020-06-12 LAB — BASIC METABOLIC PANEL
Anion gap: 16 — ABNORMAL HIGH (ref 5–15)
BUN: 104 mg/dL — ABNORMAL HIGH (ref 8–23)
CO2: 24 mmol/L (ref 22–32)
Calcium: 8.3 mg/dL — ABNORMAL LOW (ref 8.9–10.3)
Chloride: 95 mmol/L — ABNORMAL LOW (ref 98–111)
Creatinine, Ser: 3.82 mg/dL — ABNORMAL HIGH (ref 0.61–1.24)
GFR, Estimated: 17 mL/min — ABNORMAL LOW (ref >60)
Glucose, Bld: 162 mg/dL — ABNORMAL HIGH (ref 70–99)
Potassium: 3.7 mmol/L (ref 3.5–5.1)
Sodium: 135 mmol/L (ref 135–145)

## 2020-06-12 LAB — GLUCOSE, CAPILLARY
Glucose-Capillary: 149 mg/dL — ABNORMAL HIGH (ref 70–99)
Glucose-Capillary: 186 mg/dL — ABNORMAL HIGH (ref 70–99)
Glucose-Capillary: 175 mg/dL — ABNORMAL HIGH (ref 70–99)

## 2020-06-12 LAB — CBC
HCT: 29.2 % — ABNORMAL LOW (ref 39.0–52.0)
Hemoglobin: 8.6 g/dL — ABNORMAL LOW (ref 13.0–17.0)
MCH: 27.1 pg (ref 26.0–34.0)
MCHC: 29.5 g/dL — ABNORMAL LOW (ref 30.0–36.0)
MCV: 92.1 fL (ref 80.0–100.0)
Platelets: 217 10*3/uL (ref 150–400)
RBC: 3.17 MIL/uL — ABNORMAL LOW (ref 4.22–5.81)
RDW: 15.4 % (ref 11.5–15.5)
WBC: 10.5 10*3/uL (ref 4.0–10.5)
nRBC: 0.7 % — ABNORMAL HIGH (ref 0.0–0.2)

## 2020-06-12 MED ORDER — SODIUM CHLORIDE 0.9 % IV SOLN
INTRAVENOUS | Status: DC | PRN
  Administered 2020-06-12 – 2020-06-13 (×2): 1000 mL via INTRAVENOUS

## 2020-06-12 NOTE — Progress Notes (Signed)
[name] [name]  MRN: [id number]  DOB/AGE: May 28, 1956 64 y.o.  Primary Care Physician:Letvak, Theophilus Kinds, MD  Admit date: 06/11/2020  Chief Complaint:  Chief Complaint  Patient presents with  . Foot Pain    S-Pt presented on  06/11/2020 with  Chief Complaint  Patient presents with  . Foot Pain  . [name] [name]  is a 64 y.o. male with a known history of coronary artery disease status post CABG in 2012, moderate aortic stenosis, carotid artery stenosis status post left-sided CEA, type II diabetes on insulin, CKD stage IV, hypertension, hyper hyperlipidemia, PAD status post extensive lower extremity revascularization by surgery, obesity comes to the emergency room with increasing exertional shortness of breath and chest pain.  Nephrology was consulted for comanagement of CKD stage V patient   Patient continues to complain of left upper extremity pain Patient does complain of lack of energy/malaise/decreased appetite/continued bad taste in the mouth But after extensive discussion today patient is reluctant to initiate renal replacement therapy tomorrow. I had discussed this with the patient yesterday the symptoms that he is having are nonspecific symptoms of moderate uremia and patient is scheduled to get his AV fistula ligated tomorrow so patient was planned to get a tunneled catheter that time any initiate dialysis but today patient was reluctant to get permacath and initiate dialysis I educated patient/answered his queries  to the best of my ability       Medications    . ARIPiprazole  2 mg Oral QHS  . aspirin EC  81 mg Oral Daily  . B-complex with vitamin C  1 tablet Oral Daily  . busPIRone  20 mg Oral BID  . [START ON 06/13/2020] calcitRIOL  0.25 mcg Oral Q M,W,F  . carvedilol  3.125 mg Oral BID WC  . feeding supplement  237 mL Oral Q24H  . finasteride  5 mg Oral Daily  . gabapentin  100 mg Oral QHS  . heparin  5,000 Units Subcutaneous Q8H  . insulin aspart  0-5  Units Subcutaneous QHS  . insulin aspart  0-9 Units Subcutaneous TID WC  . isosorbide mononitrate  30 mg Oral Daily  . nicotine  21 mg Transdermal Daily  . rosuvastatin  10 mg Oral Daily  . sertraline  100 mg Oral Daily  . tamsulosin  0.4 mg Oral Daily         NUU:VOZDG from the symptoms mentioned above,there are no other symptoms referable to all systems reviewed.  Physical Exam: Vital signs in last 24 hours: Temp:  [97.6 F (36.4 C)-97.9 F (36.6 C)] 97.7 F (36.5 C) (11/21 0424) Pulse Rate:  [81-98] 81 (11/21 0424) Resp:  [18-20] 20 (11/21 0424) BP: (128-157)/(67-82) 151/82 (11/21 0424) SpO2:  [93 %-100 %] 100 % (11/21 0424) Weight:  [107.3 kg] 107.3 kg (11/20 1139) Weight change: 5.3 kg Last BM Date: 06/10/20 (pta)  Intake/Output from previous day: 11/20 0701 - 11/21 0700 In: 422 [P.O.:360; IV Piggyback:62] Out: 1700 [Urine:1700] No intake/output data recorded.   Physical Exam:  General- pt is awake,alert, oriented to time place and person. HEENT- normocephalic, atraumatic . Resp- No acute REsp distress,Rhonchi at bases. CVS- S1S2 regular in rate and rhythm. GIT- BS+, soft, NT, ND. EXT-2+ LE Edema, Cyanosis CNS-CN 2-12 grossly intact  Access Left AVF + bruit  Lab Results: CBC Recent Labs    06/11/20 0830 06/12/20 0541  WBC 14.4* 10.5  HGB 8.6* 8.6*  HCT 27.9* 29.2*  PLT 259 217    BMET Recent  Labs    06/11/20 0830 06/12/20 0541  NA 133* 135  K 3.4* 3.7  CL 92* 95*  CO2 25 24  GLUCOSE 211* 162*  BUN 110* 104*  CREATININE 4.24* 3.82*  CALCIUM 8.4* 8.3*    Creatinine trend. 20213.8-- 4.2 4.2--4.6 during August admission 4.2  probably new baseline 2.1--4.4 AKI during late July admission 2020 2.7--3.1 2019 2.8 2018 2.0 2017 1.5--1.6 2016 1.4--1.8 2015 1.4--1.8 2014 1.3--1.5 2013 1.2--1.4 2012 1.0--1.5  MICRO Recent Results (from the past 240 hour(s))  SARS CORONAVIRUS 2 (TAT 6-24 HRS) Nasopharyngeal Nasopharyngeal Swab      Status: None   Collection Time: 06/03/20 10:25 AM   Specimen: Nasopharyngeal Swab  Result Value Ref Range Status   SARS Coronavirus 2 NEGATIVE NEGATIVE Final    Comment: (NOTE) SARS-CoV-2 target nucleic acids are NOT DETECTED.  The SARS-CoV-2 RNA is generally detectable in upper and lower respiratory specimens during the acute phase of infection. Negative results do not preclude SARS-CoV-2 infection, do not rule out co-infections with other pathogens, and should not be used as the sole basis for treatment or other patient management decisions. Negative results must be combined with clinical observations, patient history, and epidemiological information. The expected result is Negative.  Fact Sheet for Patients: SugarRoll.be  Fact Sheet for Healthcare Providers: https://www.woods-mathews.com/  This test is not yet approved or cleared by the [place] FDA and  has been authorized for detection and/or diagnosis of SARS-CoV-2 by FDA under an Emergency Use Authorization (EUA). This EUA will remain  in effect (meaning this test can be used) for the duration of the COVID-19 declaration under Se ction 564(b)(1) of the Act, 21 U.S.C. section 360bbb-3(b)(1), unless the authorization is terminated or revoked sooner.  Performed at [facility], [place] [address], [place], [place] [address]   Resp Panel by RT-PCR (Flu A&B, Covid) Nasopharyngeal Swab     Status: None   Collection Time: 06/11/20  8:30 AM   Specimen: Nasopharyngeal Swab; Nasopharyngeal(NP) swabs in vial transport medium  Result Value Ref Range Status   SARS Coronavirus 2 by RT PCR NEGATIVE NEGATIVE Final    Comment: (NOTE) SARS-CoV-2 target nucleic acids are NOT DETECTED.  The SARS-CoV-2 RNA is generally detectable in upper respiratory specimens during the acute phase of infection. The lowest concentration of SARS-CoV-2 viral copies this assay can detect is 138 copies/mL. A  negative result does not preclude SARS-Cov-2 infection and should not be used as the sole basis for treatment or other patient management decisions. A negative result may occur with  improper specimen collection/handling, submission of specimen other than nasopharyngeal swab, presence of viral mutation(s) within the areas targeted by this assay, and inadequate number of viral copies(<138 copies/mL). A negative result must be combined with clinical observations, patient history, and epidemiological information. The expected result is Negative.  Fact Sheet for Patients:  EntrepreneurPulse.com.au  Fact Sheet for Healthcare Providers:  IncredibleEmployment.be  This test is no t yet approved or cleared by the [place] FDA and  has been authorized for detection and/or diagnosis of SARS-CoV-2 by FDA under an Emergency Use Authorization (EUA). This EUA will remain  in effect (meaning this test can be used) for the duration of the COVID-19 declaration under Section 564(b)(1) of the Act, 21 U.S.C.section 360bbb-3(b)(1), unless the authorization is terminated  or revoked sooner.       Influenza A by PCR NEGATIVE NEGATIVE Final   Influenza B by PCR NEGATIVE NEGATIVE Final    Comment: (NOTE)  The Xpert Xpress SARS-CoV-2/FLU/RSV plus assay is intended as an aid in the diagnosis of influenza from Nasopharyngeal swab specimens and should not be used as a sole basis for treatment. Nasal washings and aspirates are unacceptable for Xpert Xpress SARS-CoV-2/FLU/RSV testing.  Fact Sheet for Patients: EntrepreneurPulse.com.au  Fact Sheet for Healthcare Providers: IncredibleEmployment.be  This test is not yet approved or cleared by the [place] FDA and has been authorized for detection and/or diagnosis of SARS-CoV-2 by FDA under an Emergency Use Authorization (EUA). This EUA will remain in effect (meaning this test can  be used) for the duration of the COVID-19 declaration under Section 564(b)(1) of the Act, 21 U.S.C. section 360bbb-3(b)(1), unless the authorization is terminated or revoked.  Performed at [facility], [address] [place], [place] [address]       Lab Results  Component Value Date   CALCIUM 8.3 (L) 06/12/2020   CAION 1.06 (L) 07/02/2016   PHOS 7.6 (H) 06/01/2020        Chest x-ray done this-March 20, 2020  IMPRESSION: Diffuse interstitial opacities suggestive of pulmonary edema given cardiomegaly. Atypical infection could have a similar appearance. Recommend clinical correlation.  2D echo done on June 21st 2021 1. Left ventricular ejection fraction, by estimation, is 50 to 55%. The  left ventricle has low normal function. The left ventricle has no regional  wall motion abnormalities. There is mild left ventricular hypertrophy.  Left ventricular diastolic  parameters are consistent with Grade II diastolic dysfunction  (pseudonormalization).   Impression:    Patient is a 64 year old Caucasian male with a past medical history of coronary artery disease, status post CABG in 2012, moderate aortic stenosis, carotid artery stenosis status post left-sided CEA, diabetes mellitus type 2 , CKD stage IV, hypertension, hyperlipidemia, PAD status post extensive lower extremity revascularization by surgery, obesity came to the emergency room with increasing left upper extremity pain, bilateral feet pain and lower extremity swelling    . 1)Renal    Patient has CKD stage V Patient has CKD since 2013 Patient has CKD most likely secondary to multiple factors Patient has CKD from diabetes mellitus Possible contribution from hypertension/history of multiple AKI/cardiorenal syndrome  Patient BUN is now more than 100 Patient hemoglobin is better than the last admission.   Patient has known CKD stage V Patient has moderate uremic symptoms I discussed with the  patient about need to start him on renal placement therapy Patient is willing to start dialysis I then discussed this with the vascular surgeon/the primary team about need for access placement. Patient was to get tunneled catheter placed on Monday, will old ff for now  Patient will be continued on diuretics   2)HTN Blood pressure at goal   3)Anemia of chronic disease  HGb is not at goal (9--11)    4) secondary hyperparathyroidism -CKD Mineral-Bone Disorder   Secondary Hyperparathyroidism present Patient intact PTH level reviewed.patient was on the higher side-118 on August 19  Phosphorus at goal.   5) left upper extremity pain/steal syndrome Patient had fistula placed on May 24, 2020 Patient thereafter developed steal syndrome. Patient underwent angioplasty on June 06, 2020. Plan is to ligate the fistula Primary team and vascular surgery is following  6) electrolytes   sodium Normonatremic   potassium Normokalemic    7)Acid base Co2 at goal  8)Chronic diastolic CHF Patient admitted with fluid overload Patient is on diuretics      Plan:  Patient has known CKD stage IV/V Patient has moderate  uremic symptoms I discussed with the patient about need to start him on renal placement therapy Patient is willing to start dialysis yesterday but today patient is reluctant to get a tunneled catheter placement when he is scheduled to go tomorrow for ligation of his fistula We will continue educate Patient will be continued on diuretics We will continue to follow  I have contacted the primary team and the vascular surgeon about change of patient's mind    Azhia Siefken s [facility] 06/12/2020, 7:14 AM

## 2020-06-12 NOTE — Progress Notes (Signed)
PROGRESS NOTE    [name] [name]  VEH:[id number] DOB: [birth year] DOA: 06/11/2020 PCP: [name], MD   Chief complaint.  Left arm pain. Brief Narrative:  [name] [name] is a 64 y.o. 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 Patient was found to have left arm ischemia due to steal syndrome from AV fistula.  Has been seen by vascular surgeon, scheduled AV fistula ligation on Monday.   Assessment & Plan:   Principal Problem:   Left arm pain-due to ischemia secondary to steal syndrome Active Problems:   Hyperlipidemia, mixed   Essential hypertension   PVD (peripheral vascular disease) (HCC)   BPH (benign prostatic hyperplasia)   Type II diabetes mellitus with renal manifestations (HCC)   Chronic heart failure with preserved ejection fraction (HFpEF) (HCC)   CAD (coronary artery disease)   CKD (chronic kidney disease), stage IV (HCC)   Depression   Anemia in chronic kidney disease   COPD (chronic obstructive pulmonary disease) (HCC)   Stroke (HCC)   Hypokalemia   Leukocytosis   Combined congestive systolic and diastolic heart failure ([place])  #1.  Left arm ischemia secondary to AV fistula related steal syndrome. Scheduled for AV fistula ligation or coil tomorrow by vascular surgery. Continue symptomatic treatment.  2.  Bilateral leg pain.  Most likely due to neuropathy. Continue Neurontin.  3.  Chronic kidney disease stage IV/V. Pending decision about permacath placement.  #4.  Chronic combined systolic and diastolic congestive heart failure. Ejection fraction 30 to 35%. No exacerbation. Continue home medicines.  5.  Anemia of chronic kidney disease. Follow hemoglobin, transfuse as  needed. Check iron B12 level.  6.  COPD. Stable.  7. History of stroke. Stable.    DVT prophylaxis: Heparin Code Status: Full Family Communication: None Disposition Plan:  .   Status is: Inpatient  Remains inpatient appropriate because:Inpatient level of care appropriate due to severity of illness   Dispo: The patient is from: Home              Anticipated d/c is to: Home              Anticipated d/c date is: 1 day              Patient currently is not medically stable to d/c.        I/O last 3 completed shifts: In: 836 [P.O.:360; IV Piggyback:62] Out: 6294 [TMLYY:5035] No intake/output data recorded.     Consultants:   Nephrology and vascular surgery  Procedures: None  Antimicrobials: None  Subjective: Patient still complaining of left arm, hand pain.  With hand swelling. Denies any short of breath or cough. No abdominal pain nausea vomiting. No fever chills.  Objective: Vitals:   06/11/20 1529 06/11/20 1951 06/12/20 0003 06/12/20 0424  BP: (!) 147/73 139/72 129/70 (!) 151/82  Pulse: 96 89 83 81  Resp: 20 20 19 20   Temp: 97.7 F (36.5 C) 97.6 F (36.4 C) 97.8 F (36.6 C) 97.7 F (36.5 C)  TempSrc: Oral Oral Oral Oral  SpO2: 94% 100% 96% 100%  Weight:      Height:        Intake/Output Summary (Last 24 hours) at 06/12/2020 0900 Last data filed at 06/12/2020 0540 Gross per 24 hour  Intake  422.02 ml  Output 1700 ml  Net -1277.98 ml   Filed Weights   06/11/20 0533 06/11/20 1139  Weight: 102 kg 107.3 kg    Examination:  General exam: Appears calm and comfortable  Respiratory system: Clear to auscultation. Respiratory effort normal. Cardiovascular system: S1 & S2 heard, RRR. No JVD, murmurs, rubs, gallops or clicks. 2+ pedal edema. Gastrointestinal system: Abdomen is nondistended, soft and nontender. No organomegaly or masses felt. Normal bowel sounds heard. Central nervous system: Alert and oriented. No focal neurological  deficits. Extremities: Left arm swelling Skin: No rashes, lesions or ulcers Psychiatry: Mood & affect appropriate.     Data Reviewed: I have personally reviewed following labs and imaging studies  CBC: Recent Labs  Lab 06/11/20 0830 06/12/20 0541  WBC 14.4* 10.5  NEUTROABS 12.4*  --   HGB 8.6* 8.6*  HCT 27.9* 29.2*  MCV 90.6 92.1  PLT 259 175   Basic Metabolic Panel: Recent Labs  Lab 06/11/20 0830 06/12/20 0541  NA 133* 135  K 3.4* 3.7  CL 92* 95*  CO2 25 24  GLUCOSE 211* 162*  BUN 110* 104*  CREATININE 4.24* 3.82*  CALCIUM 8.4* 8.3*  MG 2.6*  --    GFR: Estimated Creatinine Clearance: 21.8 mL/min (A) (by C-G formula based on SCr of 3.82 mg/dL (H)). Liver Function Tests: No results for input(s): AST, ALT, ALKPHOS, BILITOT, PROT, ALBUMIN in the last 168 hours. No results for input(s): LIPASE, AMYLASE in the last 168 hours. No results for input(s): AMMONIA in the last 168 hours. Coagulation Profile: Recent Labs  Lab 06/11/20 0830  INR 1.1   Cardiac Enzymes: No results for input(s): CKTOTAL, CKMB, CKMBINDEX, TROPONINI in the last 168 hours. BNP (last 3 results) No results for input(s): PROBNP in the last 8760 hours. HbA1C: No results for input(s): HGBA1C in the last 72 hours. CBG: Recent Labs  Lab 06/06/20 1557 06/11/20 1127 06/11/20 1644 06/11/20 1947 06/12/20 0751  GLUCAP 161* 178* 171* 153* 149*   Lipid Profile: No results for input(s): CHOL, HDL, LDLCALC, TRIG, CHOLHDL, LDLDIRECT in the last 72 hours. Thyroid Function Tests: No results for input(s): TSH, T4TOTAL, FREET4, T3FREE, THYROIDAB in the last 72 hours. Anemia Panel: No results for input(s): VITAMINB12, FOLATE, FERRITIN, TIBC, IRON, RETICCTPCT in the last 72 hours. Sepsis Labs: No results for input(s): PROCALCITON, LATICACIDVEN in the last 168 hours.  Recent Results (from the past 240 hour(s))  SARS CORONAVIRUS 2 (TAT 6-24 HRS) Nasopharyngeal Nasopharyngeal Swab     Status: None    Collection Time: 06/03/20 10:25 AM   Specimen: Nasopharyngeal Swab  Result Value Ref Range Status   SARS Coronavirus 2 NEGATIVE NEGATIVE Final    Comment: (NOTE) SARS-CoV-2 target nucleic acids are NOT DETECTED.  The SARS-CoV-2 RNA is generally detectable in upper and lower respiratory specimens during the acute phase of infection. Negative results do not preclude SARS-CoV-2 infection, do not rule out co-infections with other pathogens, and should not be used as the sole basis for treatment or other patient management decisions. Negative results must be combined with clinical observations, patient history, and epidemiological information. The expected result is Negative.  Fact Sheet for Patients: SugarRoll.be  Fact Sheet for Healthcare Providers: https://www.woods-mathews.com/  This test is not yet approved or cleared by the [place] FDA and  has been authorized for detection and/or diagnosis of SARS-CoV-2 by FDA under an Emergency Use Authorization (EUA). This EUA will remain  in effect (meaning this test can be used) for the duration  of the COVID-19 declaration under Se ction 564(b)(1) of the Act, 21 U.S.C. section 360bbb-3(b)(1), unless the authorization is terminated or revoked sooner.  Performed at [facility], [place] [address], [place], [place] [address]   Resp Panel by RT-PCR (Flu A&B, Covid) Nasopharyngeal Swab     Status: None   Collection Time: 06/11/20  8:30 AM   Specimen: Nasopharyngeal Swab; Nasopharyngeal(NP) swabs in vial transport medium  Result Value Ref Range Status   SARS Coronavirus 2 by RT PCR NEGATIVE NEGATIVE Final    Comment: (NOTE) SARS-CoV-2 target nucleic acids are NOT DETECTED.  The SARS-CoV-2 RNA is generally detectable in upper respiratory specimens during the acute phase of infection. The lowest concentration of SARS-CoV-2 viral copies this assay can detect is 138 copies/mL. A negative result  does not preclude SARS-Cov-2 infection and should not be used as the sole basis for treatment or other patient management decisions. A negative result may occur with  improper specimen collection/handling, submission of specimen other than nasopharyngeal swab, presence of viral mutation(s) within the areas targeted by this assay, and inadequate number of viral copies(<138 copies/mL). A negative result must be combined with clinical observations, patient history, and epidemiological information. The expected result is Negative.  Fact Sheet for Patients:  EntrepreneurPulse.com.au  Fact Sheet for Healthcare Providers:  IncredibleEmployment.be  This test is no t yet approved or cleared by the [place] FDA and  has been authorized for detection and/or diagnosis of SARS-CoV-2 by FDA under an Emergency Use Authorization (EUA). This EUA will remain  in effect (meaning this test can be used) for the duration of the COVID-19 declaration under Section 564(b)(1) of the Act, 21 U.S.C.section 360bbb-3(b)(1), unless the authorization is terminated  or revoked sooner.       Influenza A by PCR NEGATIVE NEGATIVE Final   Influenza B by PCR NEGATIVE NEGATIVE Final    Comment: (NOTE) The Xpert Xpress SARS-CoV-2/FLU/RSV plus assay is intended as an aid in the diagnosis of influenza from Nasopharyngeal swab specimens and should not be used as a sole basis for treatment. Nasal washings and aspirates are unacceptable for Xpert Xpress SARS-CoV-2/FLU/RSV testing.  Fact Sheet for Patients: EntrepreneurPulse.com.au  Fact Sheet for Healthcare Providers: IncredibleEmployment.be  This test is not yet approved or cleared by the [place] FDA and has been authorized for detection and/or diagnosis of SARS-CoV-2 by FDA under an Emergency Use Authorization (EUA). This EUA will remain in effect (meaning this test can be used) for  the duration of the COVID-19 declaration under Section 564(b)(1) of the Act, 21 U.S.C. section 360bbb-3(b)(1), unless the authorization is terminated or revoked.  Performed at [facility], [address] [place], [place] [address]          Radiology Studies: US Venous Img Lower Bilateral  Result Date: 06/11/2020 CLINICAL DATA:  Bilateral lower extremity pain and swelling. EXAM: BILATERAL LOWER EXTREMITY VENOUS DOPPLER ULTRASOUND TECHNIQUE: Gray-scale sonography with compression, as well as color and duplex ultrasound, were performed to evaluate the deep venous system(s) from the level of the common femoral vein through the popliteal and proximal calf veins. COMPARISON:  None. FINDINGS: BILATERAL LOWER EXTREMITY VENOUS: Normal compressibility of the common femoral, superficial femoral, and popliteal veins, as well as the visualized calf veins. Visualized portions of profunda femoral vein and great saphenous vein unremarkable. No filling defects to suggest DVT on grayscale or color Doppler imaging. Doppler waveforms show normal direction of venous flow, normal respiratory plasticity and response to augmentation. OTHER None.  Limitations: Patient movement and mild subcutaneous edema. IMPRESSION: Negative for DVT bilateral lower extremities. Electronically Signed   By: Marin Olp M.D.   On: 06/11/2020 09:29        Scheduled Meds: . ARIPiprazole  2 mg Oral QHS  . aspirin EC  81 mg Oral Daily  . B-complex with vitamin C  1 tablet Oral Daily  . busPIRone  20 mg Oral BID  . [START ON 06/13/2020] calcitRIOL  0.25 mcg Oral Q M,W,F  . carvedilol  3.125 mg Oral BID WC  . feeding supplement  237 mL Oral Q24H  . finasteride  5 mg Oral Daily  . gabapentin  100 mg Oral QHS  . heparin  5,000 Units Subcutaneous Q8H  . insulin aspart  0-5 Units Subcutaneous QHS  . insulin aspart  0-9 Units Subcutaneous TID WC  . isosorbide mononitrate  30 mg Oral Daily  . nicotine  21 mg Transdermal  Daily  . rosuvastatin  10 mg Oral Daily  . sertraline  100 mg Oral Daily  . tamsulosin  0.4 mg Oral Daily   Continuous Infusions: . furosemide 120 mg (06/12/20 0011)     LOS: 1 day    Time spent: 25 minutes    Sharen Hones, MD [facility]   To contact the attending provider between 7A-7P or the covering provider during after hours 7P-7A, please log into the web site [email or web address] and access using universal [place] password for that web site. If you do not have the password, please call the hospital operator.  06/12/2020, 9:00 AM

## 2020-06-13 ENCOUNTER — Ambulatory Visit: Payer: Medicare Other | Admitting: Internal Medicine

## 2020-06-13 ENCOUNTER — Encounter
Admission: EM | Disposition: A | Discharge status: Hospice | Payer: Self-pay | Source: Home / Self Care | Attending: Internal Medicine

## 2020-06-13 ENCOUNTER — Inpatient Hospital Stay: Payer: Medicare Other | Admitting: Anesthesiology

## 2020-06-13 ENCOUNTER — Encounter: Payer: Self-pay | Admitting: Vascular Surgery

## 2020-06-13 ENCOUNTER — Inpatient Hospital Stay: Payer: Medicare Other

## 2020-06-13 DIAGNOSIS — T82898A Other specified complication of vascular prosthetic devices, implants and grafts, initial encounter: Secondary | ICD-10-CM

## 2020-06-13 DIAGNOSIS — I9589 Other hypotension: Secondary | ICD-10-CM

## 2020-06-13 DIAGNOSIS — N186 End stage renal disease: Secondary | ICD-10-CM

## 2020-06-13 DIAGNOSIS — Z992 Dependence on renal dialysis: Secondary | ICD-10-CM | POA: Diagnosis not present

## 2020-06-13 DIAGNOSIS — N184 Chronic kidney disease, stage 4 (severe): Secondary | ICD-10-CM | POA: Diagnosis not present

## 2020-06-13 DIAGNOSIS — I350 Nonrheumatic aortic (valve) stenosis: Secondary | ICD-10-CM | POA: Diagnosis not present

## 2020-06-13 DIAGNOSIS — I9581 Postprocedural hypotension: Secondary | ICD-10-CM | POA: Diagnosis not present

## 2020-06-13 DIAGNOSIS — M79602 Pain in left arm: Secondary | ICD-10-CM

## 2020-06-13 DIAGNOSIS — I959 Hypotension, unspecified: Secondary | ICD-10-CM

## 2020-06-13 DIAGNOSIS — I5032 Chronic diastolic (congestive) heart failure: Secondary | ICD-10-CM | POA: Diagnosis not present

## 2020-06-13 HISTORY — PX: EMBOLIZATION (CATH LAB): CATH118239

## 2020-06-13 HISTORY — PX: EMBOLIZATION: CATH118239

## 2020-06-13 LAB — CBC WITH DIFFERENTIAL/PLATELET
Abs Immature Granulocytes: 0.06 10*3/uL (ref 0.00–0.07)
Basophils Absolute: 0 10*3/uL (ref 0.0–0.1)
Basophils Relative: 0 %
Eosinophils Absolute: 0.2 10*3/uL (ref 0.0–0.5)
Eosinophils Relative: 2 %
HCT: 26.8 % — ABNORMAL LOW (ref 39.0–52.0)
Hemoglobin: 8.2 g/dL — ABNORMAL LOW (ref 13.0–17.0)
Immature Granulocytes: 1 %
Lymphocytes Relative: 12 %
Lymphs Abs: 1.5 10*3/uL (ref 0.7–4.0)
MCH: 27.3 pg (ref 26.0–34.0)
MCHC: 30.6 g/dL (ref 30.0–36.0)
MCV: 89.3 fL (ref 80.0–100.0)
Monocytes Absolute: 0.9 10*3/uL (ref 0.1–1.0)
Monocytes Relative: 7 %
Neutro Abs: 9.7 10*3/uL — ABNORMAL HIGH (ref 1.7–7.7)
Neutrophils Relative %: 78 %
Platelets: 214 10*3/uL (ref 150–400)
RBC: 3 MIL/uL — ABNORMAL LOW (ref 4.22–5.81)
RDW: 15.6 % — ABNORMAL HIGH (ref 11.5–15.5)
WBC: 12.3 10*3/uL — ABNORMAL HIGH (ref 4.0–10.5)
nRBC: 1.1 % — ABNORMAL HIGH (ref 0.0–0.2)

## 2020-06-13 LAB — HEPATITIS B CORE ANTIBODY, TOTAL: Hep B Core Total Ab: NONREACTIVE

## 2020-06-13 LAB — GLUCOSE, CAPILLARY
Glucose-Capillary: 193 mg/dL — ABNORMAL HIGH (ref 70–99)
Glucose-Capillary: 143 mg/dL — ABNORMAL HIGH (ref 70–99)
Glucose-Capillary: 111 mg/dL — ABNORMAL HIGH (ref 70–99)
Glucose-Capillary: 154 mg/dL — ABNORMAL HIGH (ref 70–99)
Glucose-Capillary: 146 mg/dL — ABNORMAL HIGH (ref 70–99)
Glucose-Capillary: 184 mg/dL — ABNORMAL HIGH (ref 70–99)

## 2020-06-13 LAB — BASIC METABOLIC PANEL
Anion gap: 14 (ref 5–15)
BUN: 116 mg/dL — ABNORMAL HIGH (ref 8–23)
CO2: 26 mmol/L (ref 22–32)
Calcium: 8.3 mg/dL — ABNORMAL LOW (ref 8.9–10.3)
Chloride: 93 mmol/L — ABNORMAL LOW (ref 98–111)
Creatinine, Ser: 3.92 mg/dL — ABNORMAL HIGH (ref 0.61–1.24)
GFR, Estimated: 16 mL/min — ABNORMAL LOW (ref >60)
Glucose, Bld: 153 mg/dL — ABNORMAL HIGH (ref 70–99)
Potassium: 3.7 mmol/L (ref 3.5–5.1)
Sodium: 133 mmol/L — ABNORMAL LOW (ref 135–145)

## 2020-06-13 LAB — HEPATITIS B SURFACE ANTIGEN: Hepatitis B Surface Ag: NONREACTIVE

## 2020-06-13 LAB — MRSA PCR SCREENING: MRSA by PCR: NEGATIVE

## 2020-06-13 SURGERY — EMBOLIZATION (CATH LAB)
Anesthesia: General | Laterality: Left

## 2020-06-13 MED ORDER — FENTANYL CITRATE (PF) 100 MCG/2ML IJ SOLN
INTRAMUSCULAR | Status: AC
  Filled 2020-06-13: qty 2

## 2020-06-13 MED ORDER — EPHEDRINE SULFATE 50 MG/ML IJ SOLN
INTRAMUSCULAR | Status: DC | PRN
  Administered 2020-06-13 (×2): 10 mg via INTRAVENOUS

## 2020-06-13 MED ORDER — EPINEPHRINE PF 1 MG/ML IJ SOLN
INTRAMUSCULAR | Status: AC
  Filled 2020-06-13: qty 2

## 2020-06-13 MED ORDER — PHENYLEPHRINE HCL (PRESSORS) 10 MG/ML IV SOLN
INTRAVENOUS | Status: DC | PRN
  Administered 2020-06-13 (×2): 100 ug via INTRAVENOUS
  Administered 2020-06-13 (×2): 200 ug via INTRAVENOUS

## 2020-06-13 MED ORDER — METHYLPREDNISOLONE SODIUM SUCC 125 MG IJ SOLR
INTRAMUSCULAR | Status: AC
  Administered 2020-06-13: 125 mg via INTRAVENOUS
  Filled 2020-06-13: qty 2

## 2020-06-13 MED ORDER — CLINDAMYCIN PHOSPHATE 300 MG/50ML IV SOLN
300.0000 mg | Freq: Once | INTRAVENOUS | Status: AC
  Administered 2020-06-13: 300 mg via INTRAVENOUS

## 2020-06-13 MED ORDER — DOPAMINE-DEXTROSE 3.2-5 MG/ML-% IV SOLN
INTRAVENOUS | Status: AC
  Filled 2020-06-13: qty 250

## 2020-06-13 MED ORDER — MIDAZOLAM HCL 2 MG/2ML IJ SOLN
INTRAMUSCULAR | Status: DC | PRN
  Administered 2020-06-13: 1 mg via INTRAVENOUS

## 2020-06-13 MED ORDER — DIPHENHYDRAMINE HCL 50 MG/ML IJ SOLN
INTRAMUSCULAR | Status: AC
  Administered 2020-06-13: 50 mg via INTRAVENOUS
  Filled 2020-06-13: qty 1

## 2020-06-13 MED ORDER — GLYCOPYRROLATE 0.2 MG/ML IJ SOLN
INTRAMUSCULAR | Status: DC | PRN
  Administered 2020-06-13: .2 mg via INTRAVENOUS

## 2020-06-13 MED ORDER — FENTANYL CITRATE (PF) 100 MCG/2ML IJ SOLN
INTRAMUSCULAR | Status: DC | PRN
  Administered 2020-06-13 (×2): 50 ug via INTRAVENOUS

## 2020-06-13 MED ORDER — PHENYLEPHRINE HCL (PRESSORS) 10 MG/ML IV SOLN
INTRAVENOUS | Status: AC
  Filled 2020-06-13: qty 1

## 2020-06-13 MED ORDER — ONDANSETRON HCL 4 MG/2ML IJ SOLN
INTRAMUSCULAR | Status: DC | PRN
  Administered 2020-06-13: 4 mg via INTRAVENOUS

## 2020-06-13 MED ORDER — FAMOTIDINE 20 MG PO TABS
ORAL_TABLET | ORAL | Status: AC
  Filled 2020-06-13: qty 2

## 2020-06-13 MED ORDER — PROPOFOL 10 MG/ML IV BOLUS
INTRAVENOUS | Status: DC | PRN
  Administered 2020-06-13: 50 mg via INTRAVENOUS

## 2020-06-13 MED ORDER — SODIUM CHLORIDE 0.9 % IV SOLN: INTRAVENOUS | Status: DC

## 2020-06-13 MED ORDER — PHENYLEPHRINE HCL-NACL 10-0.9 MG/250ML-% IV SOLN
INTRAVENOUS | Status: DC | PRN
  Administered 2020-06-13: 100 ug/min via INTRAVENOUS

## 2020-06-13 MED ORDER — CLINDAMYCIN PHOSPHATE 300 MG/50ML IV SOLN
INTRAVENOUS | Status: AC
  Filled 2020-06-13: qty 50

## 2020-06-13 MED ORDER — CHLORHEXIDINE GLUCONATE CLOTH 2 % EX PADS
6.0000 | MEDICATED_PAD | Freq: Every day | CUTANEOUS | Status: DC
  Administered 2020-06-13: 6 via TOPICAL

## 2020-06-13 MED ORDER — IODIXANOL 320 MG/ML IV SOLN
INTRAVENOUS | Status: DC | PRN
  Administered 2020-06-13: 15 mL

## 2020-06-13 MED ORDER — METHYLPREDNISOLONE SODIUM SUCC 125 MG IJ SOLR: 125.0000 mg | Freq: Once | INTRAMUSCULAR | Status: AC

## 2020-06-13 MED ORDER — MIDAZOLAM HCL 2 MG/2ML IJ SOLN
INTRAMUSCULAR | Status: AC
  Filled 2020-06-13: qty 2

## 2020-06-13 MED ORDER — EPINEPHRINE 1 MG/10ML IJ SOSY
PREFILLED_SYRINGE | INTRAMUSCULAR | Status: DC | PRN
  Administered 2020-06-13 (×4): .01 mg via INTRAVENOUS

## 2020-06-13 MED ORDER — PROPOFOL 500 MG/50ML IV EMUL
INTRAVENOUS | Status: AC
  Filled 2020-06-13: qty 50

## 2020-06-13 MED ORDER — VASOPRESSIN 20 UNIT/ML IV SOLN
INTRAVENOUS | Status: DC | PRN
  Administered 2020-06-13 (×2): 2 [IU] via INTRAVENOUS
  Administered 2020-06-13 (×3): 4 [IU] via INTRAVENOUS
  Administered 2020-06-13: 2 [IU] via INTRAVENOUS

## 2020-06-13 MED ORDER — SODIUM CHLORIDE 0.9 % IV SOLN
300.0000 mg | Freq: Once | INTRAVENOUS | Status: AC
  Administered 2020-06-13: 300 mg via INTRAVENOUS
  Filled 2020-06-13: qty 15

## 2020-06-13 MED ORDER — DIPHENHYDRAMINE HCL 50 MG/ML IJ SOLN: 50.0000 mg | Freq: Once | INTRAMUSCULAR | Status: AC

## 2020-06-13 MED ORDER — PHENYLEPHRINE HCL-NACL 10-0.9 MG/250ML-% IV SOLN
0.0000 ug/min | INTRAVENOUS | Status: DC
  Administered 2020-06-13: 50 ug/min via INTRAVENOUS
  Administered 2020-06-13: 100 ug/min via INTRAVENOUS
  Administered 2020-06-13: 90 ug/min via INTRAVENOUS
  Administered 2020-06-13: 80 ug/min via INTRAVENOUS
  Administered 2020-06-13: 70 ug/min via INTRAVENOUS
  Filled 2020-06-13 (×3): qty 250

## 2020-06-13 SURGICAL SUPPLY — 14 items
CANNULA 5F STIFF (CANNULA) ×3
CATH BEACON 5 .035 40 KMP TP (CATHETERS) ×1
CATH BEACON 5 .038 40 KMP TP (CATHETERS) ×2
COIL 400 COMPLEX STD 12X60CM (Vascular Products) ×3 IMPLANT
DRAPE BRACHIAL (DRAPES) ×3
GLIDEWIRE STIFF .35X180X3 HYDR (WIRE) ×3
HANDLE DETACHMENT COIL (MISCELLANEOUS) ×3
MICROCATH PROGREAT 2.8F 110 CM (CATHETERS) ×3
OCCLUSION DEVICE POD10 (Embolic) ×3 IMPLANT
OCCLUSION DEVICE POD12 (Embolic) ×6 IMPLANT
OCCLUSION DEVICE PODJ60 (Vascular Products) ×6 IMPLANT
PACK ANGIOGRAPHY (CUSTOM PROCEDURE TRAY) ×3
SHEATH BRITE TIP 6FRX5.5 (SHEATH) ×3
SUT MNCRL AB 4-0 PS2 18 (SUTURE) ×3

## 2020-06-13 NOTE — Consult Note (Signed)
Reason for Consult: Postoperative hypotension Referring Physician: Sharen Hones, mD  [name] [name] is an 64 y.o. male.  HPI: The patient is a very complex 64 year old male with chronic kidney disease stage V who presented as outpatient today for a surgery on his left AV fistula due to left hand pain from ischemia due to a steal syndrome.  He underwent embolization of the left arm AV fistula. He has not started dialysis yet.  He had failed prior interventions on his left arm to improve flow. Postoperatively the patient was noted to be hypotensive and slow to emerge from anesthesia.  He was able to be extubated but remained hypotensive.  A central line was inserted by Dr. Randa [name] the anesthesiologist.  Neo-Synephrine was then started IV to maintain adequate blood pressure.  The patient of note has significant aortic stenosis which complicates his management.  The patient was anticipated to go to either stepdown or ICU and critical care has been asked to assist with any potential critical care issues.  At the time of evaluation the patient was somewhat befuddled, still obviously under the effects of anesthesia.  He was requiring 70-100 mics of Neo-Synephrine IV.    Past Medical History:  Diagnosis Date  . 3-vessel CAD    8/30 LHC with radial graft to RCA occluded at the ostium with critical stenosis of the ostial native RCA.  . Adenomatous colon polyp   . Allergy   . Anemia   . Anxiety   . Arthritis    RA  . Asthma   . Bell's palsy 2007   neuropathy  . CAD (coronary artery disease)    03/21/2020 LHC with patent LIMA to LAD and SVG to OM 3.  Radial graft to RCA occluded at the ostium with critical stenosis of the ostial and native RCA.  . Carotid artery occlusion    Bilateral carotid stenosis s/p left-sided CEA.  . Cataract    Dr. Dawna Part  . CKD (chronic kidney disease), stage IV ([place])    03/2020 vascular surgery access planned  . Depression   . Diabetes mellitus   . Diverticulosis   .  Duodenitis   . DVT (deep venous thrombosis) (HCC)    in leg  . Dyspnea    with edema  . ESRD (end stage renal disease) ([place])   . Gastropathy   . GERD (gastroesophageal reflux disease)   . Heart failure with preserved ejection fraction ([place])    8/30 RHC with moderately to severely elevated filling pressures and pulmonary wedge pressure 31 mmHg, moderate pulmonary hypertension  . Heart murmur   . Helicobacter pylori gastritis   . Hx of CABG    2012  . Hyperlipidemia   . Hypertension   . Mitral regurgitation and aortic stenosis    RHC 8/30 and echo 12/2019.  [name] Myocardial infarction ([place]) 2021  . Neuropathy   . Obesity   . Peripheral vascular disease ([place])    S/p extensive revascularization by vascular surgery 05/2019  . Post splenectomy syndrome   . Psoriasis   . Severe aortic stenosis    03/21/20 RHC with peak gradient 27.4 mmHg and valve area 0.87.  [name] Sleep apnea    uses cpap  . Stroke ([place])   . Tobacco use disorder    recently quit  . Tubular adenoma 08/2013   Dr. Hilarie Fredrickson  . Weak urinary stream     Past Surgical History:  Procedure Laterality Date  . AV FISTULA PLACEMENT N/A 05/24/2020   Procedure:  ARTERIOVENOUS (AV) FISTULA CREATION (Brachio-cephalic);  Surgeon: Algernon Huxley, MD;  Location: [facility] ORS;  Service: Vascular;  Laterality: N/A;  . CARDIAC CATHETERIZATION  08/2010   LAD: 80% ISR, RCA: 80% ostial, OM 80-90%  . CAROTID ENDARTERECTOMY  08/2010   left/ Dr. Kellie Simmering  . CATARACT EXTRACTION W/PHACO Left 07/09/2017   Procedure: CATARACT EXTRACTION PHACO AND INTRAOCULAR LENS PLACEMENT (IOC);  Surgeon: Birder Robson, MD;  Location: [facility];  Service: Ophthalmology;  Laterality: Left;  [place] 00:23 AP% 14.2 CDE 3.26 Fluid pack lot # [id number]  . CATARACT EXTRACTION W/PHACO Right 09/10/2017   Procedure: CATARACT EXTRACTION PHACO AND INTRAOCULAR LENS PLACEMENT (IOC);  Surgeon: Birder Robson, MD;  Location: [facility];  Service: Ophthalmology;  Laterality: Right;  [place]  00:26.4 AP% 17.3 CDE 4.59 Fluid Pack Lot # [id number]  . COLONOSCOPY    . CORONARY ANGIOPLASTY     LAD: before CABG  . CORONARY ARTERY BYPASS GRAFT  09/06/2010   At [facility]: LIMA to LAD, left radial to RCA, sequential SVG to OM3 and 4  . CORONARY STENT INTERVENTION N/A 03/23/2020   Procedure: CORONARY STENT INTERVENTION;  Surgeon: Wellington Hampshire, MD;  Location: [place] [facility];  Service: Cardiovascular;  Laterality: N/A;  RCA  . ESOPHAGOGASTRODUODENOSCOPY (EGD) WITH PROPOFOL N/A 04/11/2020   Procedure: ESOPHAGOGASTRODUODENOSCOPY (EGD) WITH PROPOFOL;  Surgeon: Lucilla Lame, MD;  Location[facility];  Service: Endoscopy;  Laterality: N/A;  . heart stents  Jan 2011   leg stents 06/2009 and 03/2010  . LOWER EXTREMITY ANGIOGRAPHY Left 06/09/2019   Procedure: LOWER EXTREMITY ANGIOGRAPHY;  Surgeon: Katha Cabal, MD;  Location: [place] [facility];  Service: Cardiovascular;  Laterality: Left;  . PTA of illiac and SFA  multiple   Dr. Ronalee Belts, s/p revision 11/2012  . RIGHT AND LEFT HEART CATH N/A 03/21/2020   Procedure: RIGHT AND LEFT HEART CATH possible percutaneous intervention;  Surgeon: Wellington Hampshire, MD;  Location: [place] [facility];  Service: Cardiovascular;  Laterality: N/A;  . RIGHT/LEFT HEART CATH AND CORONARY/GRAFT ANGIOGRAPHY N/A 02/15/2020   Procedure: RIGHT/LEFT HEART CATH AND CORONARY/GRAFT ANGIOGRAPHY;  Surgeon: Wellington Hampshire, MD;  Location: [place] [facility];  Service: Cardiovascular;  Laterality: N/A;  . SPLENECTOMY    . UPPER EXTREMITY ANGIOGRAPHY Left 06/06/2020   Procedure: UPPER EXTREMITY ANGIOGRAPHY;  Surgeon: Algernon Huxley, MD;  Location: [place] [facility];  Service: Cardiovascular;  Laterality: Left;  [name] VASCULAR SURGERY     LEG STENTS    Family History  Problem Relation Age of Onset  . Lung cancer Father 21  . Arthritis Father   . Breast cancer Sister 65  . Alcoholism Sister   . Dementia Mother   . Alzheimer's disease Mother   . Arthritis  Mother   . Alcoholism Brother   . Epilepsy Sister   . Diabetes Paternal Grandfather   . Colon polyps Paternal Grandfather 56  . Alcoholism Sister   . Alcoholism Sister   . Alcoholism Brother     Social History:  reports that he has quit smoking. His smoking use included cigarettes. He has a 10.00 pack-year smoking history. He has never used smokeless tobacco. He reports that he does not drink alcohol and does not use drugs.  Allergies:  Allergies  Allergen Reactions  . Contrast Media [Iodinated Diagnostic Agents] Rash and Other (See Comments)    Got very hot and red   . Glipizide Other (See Comments)    ANTIDIABETICS. Burning  . Hydroxychloroquine Other (See  Comments)    Stomach upset  . Metrizamide Other (See Comments)    Got very hot and red  . Penicillins Hives and Swelling    Has patient had a PCN reaction causing immediate rash, facial/tongue/throat swelling, SOB or lightheadedness with hypotension: yes Has patient had a PCN reaction causing severe rash involving mucus membranes or skin necrosis: no  Has patient had a PCN reaction that required hospitalization: yes Has patient had a PCN reaction occurring within the last 10 years: no If all of the above answers are "NO", then may proceed with Cephalosporin use.     Scheduled Meds: . ARIPiprazole  2 mg Oral QHS  . aspirin EC  81 mg Oral Daily  . B-complex with vitamin C  1 tablet Oral Daily  . busPIRone  20 mg Oral BID  . carvedilol  3.125 mg Oral BID WC  . Chlorhexidine Gluconate Cloth  6 each Topical Daily  . feeding supplement  237 mL Oral Q24H  . finasteride  5 mg Oral Daily  . gabapentin  100 mg Oral QHS  . heparin  5,000 Units Subcutaneous Q8H  . insulin aspart  0-5 Units Subcutaneous QHS  . insulin aspart  0-9 Units Subcutaneous TID WC  . isosorbide mononitrate  30 mg Oral Daily  . nicotine  21 mg Transdermal Daily  . rosuvastatin  10 mg Oral Daily  . sertraline  100 mg Oral Daily  . tamsulosin  0.4 mg  Oral Daily   Continuous Infusions: . sodium chloride 10 mL/hr at 06/13/20 0934  . furosemide Stopped (06/13/20 0139)  . phenylephrine (NEO-SYNEPHRINE) Adult infusion Stopped (06/13/20 1700)   PRN Meds:.sodium chloride, acetaminophen, albuterol, alum & mag hydroxide-simeth, dextromethorphan-guaiFENesin, hydrALAZINE, morphine injection, nitroGLYCERIN, ondansetron (ZOFRAN) IV, oxyCODONE-acetaminophen, polyvinyl alcohol   Results for orders placed or performed during the hospital encounter of 06/11/20 (from the past 48 hour(s))  Glucose, capillary     Status: Abnormal   Collection Time: 06/11/20  4:44 PM  Result Value Ref Range   Glucose-Capillary 171 (H) 70 - 99 mg/dL    Comment: Glucose reference range applies only to samples taken after fasting for at least 8 hours.  Glucose, capillary     Status: Abnormal   Collection Time: 06/11/20  7:47 PM  Result Value Ref Range   Glucose-Capillary 153 (H) 70 - 99 mg/dL    Comment: Glucose reference range applies only to samples taken after fasting for at least 8 hours.  Basic metabolic panel     Status: Abnormal   Collection Time: 06/12/20  5:41 AM  Result Value Ref Range   Sodium 135 135 - 145 mmol/L   Potassium 3.7 3.5 - 5.1 mmol/L   Chloride 95 (L) 98 - 111 mmol/L   CO2 24 22 - 32 mmol/L   Glucose, Bld 162 (H) 70 - 99 mg/dL    Comment: Glucose reference range applies only to samples taken after fasting for at least 8 hours.   BUN 104 (H) 8 - 23 mg/dL    Comment: RESULTS VERIFIED BY REPEAT TESTING/HKP   Creatinine, Ser 3.82 (H) 0.61 - 1.24 mg/dL   Calcium 8.3 (L) 8.9 - 10.3 mg/dL   GFR, Estimated 17 (L) >60 mL/min    Comment: (NOTE) Calculated using the CKD-EPI Creatinine Equation (2021)    Anion gap 16 (H) 5 - 15    Comment: Performed at [facility], [address], [place], [place] [address]  CBC     Status: Abnormal  Collection Time: 06/12/20  5:41 AM  Result Value Ref Range   WBC 10.5 4.0 - 10.5 K/uL   RBC 3.17 (L)  4.22 - 5.81 MIL/uL   Hemoglobin 8.6 (L) 13.0 - 17.0 g/dL   HCT 29.2 (L) 39 - 52 %   MCV 92.1 80.0 - 100.0 fL   MCH 27.1 26.0 - 34.0 pg   MCHC 29.5 (L) 30.0 - 36.0 g/dL   RDW 15.4 11.5 - 15.5 %   Platelets 217 150 - 400 K/uL   nRBC 0.7 (H) 0.0 - 0.2 %    Comment: Performed at [facility], [address] [place], [place] [address]  Vitamin B12     Status: Abnormal   Collection Time: 06/12/20  5:41 AM  Result Value Ref Range   Vitamin B-12 1,441 (H) 180 - 914 pg/mL    Comment: (NOTE) This assay is not validated for testing neonatal or myeloproliferative syndrome specimens for Vitamin B12 levels. Performed at [facility], [place] [address], [place], [place] [address]   Iron and TIBC     Status: Abnormal   Collection Time: 06/12/20  5:41 AM  Result Value Ref Range   Iron 20 (L) 45 - 182 ug/dL   TIBC 384 250 - 450 ug/dL   Saturation Ratios 5 (L) 17.9 - 39.5 %   UIBC 364 ug/dL    Comment: Performed at [facility], [address] [place], [place] [address]  Glucose, capillary     Status: Abnormal   Collection Time: 06/12/20  7:51 AM  Result Value Ref Range   Glucose-Capillary 149 (H) 70 - 99 mg/dL    Comment: Glucose reference range applies only to samples taken after fasting for at least 8 hours.  Glucose, capillary     Status: Abnormal   Collection Time: 06/12/20 11:59 AM  Result Value Ref Range   Glucose-Capillary 186 (H) 70 - 99 mg/dL    Comment: Glucose reference range applies only to samples taken after fasting for at least 8 hours.  Glucose, capillary     Status: Abnormal   Collection Time: 06/12/20  5:15 PM  Result Value Ref Range   Glucose-Capillary 175 (H) 70 - 99 mg/dL    Comment: Glucose reference range applies only to samples taken after fasting for at least 8 hours.  Glucose, capillary     Status: Abnormal   Collection Time: 06/12/20 11:02 PM  Result Value Ref Range   Glucose-Capillary 193 (H) 70 - 99 mg/dL    Comment: Glucose reference  range applies only to samples taken after fasting for at least 8 hours.  CBC with Differential/Platelet     Status: Abnormal   Collection Time: 06/13/20  5:32 AM  Result Value Ref Range   WBC 12.3 (H) 4.0 - 10.5 K/uL   RBC 3.00 (L) 4.22 - 5.81 MIL/uL   Hemoglobin 8.2 (L) 13.0 - 17.0 g/dL   HCT 26.8 (L) 39 - 52 %   MCV 89.3 80.0 - 100.0 fL   MCH 27.3 26.0 - 34.0 pg   MCHC 30.6 30.0 - 36.0 g/dL   RDW 15.6 (H) 11.5 - 15.5 %   Platelets 214 150 - 400 K/uL   nRBC 1.1 (H) 0.0 - 0.2 %   Neutrophils Relative % 78 %   Neutro Abs 9.7 (H) 1.7 - 7.7 K/uL   Lymphocytes Relative 12 %   Lymphs Abs 1.5 0.7 - 4.0 K/uL   Monocytes Relative 7 %   Monocytes  Absolute 0.9 0.1 - 1.0 K/uL   Eosinophils Relative 2 %   Eosinophils Absolute 0.2 0.0 - 0.5 K/uL   Basophils Relative 0 %   Basophils Absolute 0.0 0.0 - 0.1 K/uL   Immature Granulocytes 1 %   Abs Immature Granulocytes 0.06 0.00 - 0.07 K/uL    Comment: Performed at [facility], [address] [place], [place] [address]  Basic metabolic panel     Status: Abnormal   Collection Time: 06/13/20  5:32 AM  Result Value Ref Range   Sodium 133 (L) 135 - 145 mmol/L   Potassium 3.7 3.5 - 5.1 mmol/L   Chloride 93 (L) 98 - 111 mmol/L   CO2 26 22 - 32 mmol/L   Glucose, Bld 153 (H) 70 - 99 mg/dL    Comment: Glucose reference range applies only to samples taken after fasting for at least 8 hours.   BUN 116 (H) 8 - 23 mg/dL    Comment: RESULT CONFIRMED BY MANUAL DILUTION DAS   Creatinine, Ser 3.92 (H) 0.61 - 1.24 mg/dL   Calcium 8.3 (L) 8.9 - 10.3 mg/dL   GFR, Estimated 16 (L) >60 mL/min    Comment: (NOTE) Calculated using the CKD-EPI Creatinine Equation (2021)    Anion gap 14 5 - 15    Comment: Performed at [facility], [address] [place], [place] [address]  Hepatitis B surface antigen     Status: None   Collection Time: 06/13/20  5:32 AM  Result Value Ref Range   Hepatitis B Surface Ag NON REACTIVE NON REACTIVE     Comment: Performed at [place] [address], [place], [place] [address]  MRSA PCR Screening     Status: None   Collection Time: 06/13/20  5:40 AM   Specimen: Nasal Mucosa; Nasopharyngeal  Result Value Ref Range   MRSA by PCR NEGATIVE NEGATIVE    Comment:        The GeneXpert MRSA Assay (FDA approved for NASAL specimens only), is one component of a comprehensive MRSA colonization surveillance program. It is not intended to diagnose MRSA infection nor to guide or monitor treatment for MRSA infections. Performed at [facility], [address] [place], [place] [address]   Glucose, capillary     Status: Abnormal   Collection Time: 06/13/20  8:19 AM  Result Value Ref Range   Glucose-Capillary 143 (H) 70 - 99 mg/dL    Comment: Glucose reference range applies only to samples taken after fasting for at least 8 hours.  Glucose, capillary     Status: Abnormal   Collection Time: 06/13/20 10:50 AM  Result Value Ref Range   Glucose-Capillary 111 (H) 70 - 99 mg/dL    Comment: Glucose reference range applies only to samples taken after fasting for at least 8 hours.   *Note: Due to a large number of results and/or encounters for the requested time period, some results have not been displayed. A complete set of results can be found in Results Review.    PERIPHERAL VASCULAR CATHETERIZATION  Result Date: 06/13/2020 See op note  DG Chest Port 1 View  Result Date: 06/13/2020 CLINICAL DATA:  PICC line placement EXAM: PORTABLE CHEST 1 VIEW COMPARISON:  05/20/2020 FINDINGS: Right neck PICC line tip in the SVC 3 cm above the right atrium. No pneumothorax. Mild fluid overload/congestive heart failure. Poor inspiration with some volume loss at both lung bases. Previous median sternotomy and CABG. IMPRESSION: 1. PICC line well positioned with  tip in the SVC 3 cm above the right atrium. 2. Mild fluid overload/congestive heart failure. Electronically Signed   By: Nelson Chimes M.D.    On: 06/13/2020 11:46    Review of Systems  Cannot provide reliably due to befuddled state post anesthesia.   Blood pressure (!) 150/81, pulse 81, temperature 98 F (36.7 C), temperature source Oral, resp. rate 17, height 5\' 6"  (1.676 m), weight 106.9 kg, SpO2 92 %. Physical Exam GENERAL: Chronically ill-appearing man, no acute respiratory distress.  Appears confused at times. HEAD: Normocephalic, atraumatic.  EYES: Pupils equal, round, reactive to light.  No scleral icterus.  MOUTH: Oral mucosa moist. NECK: Supple. No thyromegaly. Trachea midline. No JVD.  No adenopathy. PULMONARY: Good air entry bilaterally.  No adventitious sounds. CARDIOVASCULAR: S1 and S2. Regular rate and rhythm.  Grade 3/6 to 4/6 aortic stenosis murmur. ABDOMEN: Obese, soft, no grimacing on palpation, normoactive bowel sounds. MUSCULOSKELETAL: No joint deformity, no clubbing, no edema.  Diminished pulses on the left upper extremity NEUROLOGIC: Somewhat befuddled, no overt focal deficit. SKIN: Cool toes distally, no overt rashes. PSYCH: Cannot assess currently.    Assessment/Plan:  Postoperative hypotension Suspect lingering effects from anesthesia Effect pronounced due to severe aortic stenosis Currently on Neo-Synephrine Wean off Neo-Synephrine as tolerated Do not anticipate prolonged requirement  Severe aortic stenosis This issue adds complexity to his management Ideally should have surgery Will need dialysis to be able to undergo TAVR Consider palliative care/comfort measures Patient poor candidate for surgical intervention at present  Chronic kidney disease stage V Mild confusion may be due to uremia Per renal plan for temporary access and dialysis on this admission   Patient to be transferred to stepdown unit.  Anticipate transfer out of SDU/ICU in the morning if remains stable.    Renold Don, MD [facility] 06/13/2020, 2:43 PM   *This note was dictated using voice recognition  software/Dragon.  Despite best efforts to proofread, errors can occur which can change the meaning.  Any change was purely unintentional.

## 2020-06-13 NOTE — Anesthesia Procedure Notes (Addendum)
Central Venous Catheter Insertion Performed by: Emmie Niemann, MD, anesthesiologist Start/End11/22/2021 11:15 AM, 06/13/2020 11:30 AM Patient location: PACU. Preanesthetic checklist: patient identified, IV checked, site marked, risks and benefits discussed, surgical consent, monitors and equipment checked, pre-op evaluation, timeout performed and anesthesia consent Position: supine Hand hygiene performed , maximum sterile barriers used  and Seldinger technique used Catheter size: 7 Fr Total catheter length 20. Central line was placed.Triple lumen Procedure performed using ultrasound guided technique. Ultrasound Notes:anatomy identified, needle tip was noted to be adjacent to the nerve/plexus identified and no ultrasound evidence of intravascular and/or intraneural injection Attempts: 1 Following insertion, line sutured, dressing applied and Biopatch. Post procedure assessment: blood return through all ports, free fluid flow and no air  Patient tolerated the procedure well with no immediate complications. Additional procedure comments: Inadequate vascular access postop, requiring vasopressors for BP managment.

## 2020-06-13 NOTE — Progress Notes (Signed)
[facility]  ROUNDING NOTE   Subjective:   Status post ligation of AVF by Dr. Lucky Cowboy due to steel syndrome.  Patient seen post op.   Objective:  Vital signs in last 24 hours:  Temp:  [97 F (36.1 C)-98.2 F (36.8 C)] 98.2 F (36.8 C) (11/22 1600) Pulse Rate:  [74-87] 85 (11/22 1630) Resp:  [12-22] 19 (11/22 1630) BP: (93-165)/(46-95) 140/83 (11/22 1630) SpO2:  [91 %-100 %] 97 % (11/22 1630) Weight:  [106.9 kg] 106.9 kg (11/22 1432)  Weight change:  Filed Weights   06/11/20 0533 06/11/20 1139 06/13/20 1432  Weight: 102 kg 107.3 kg 106.9 kg    Intake/Output: I/O last 3 completed shifts: In: 489.1 [P.O.:360; I.V.:5.1; IV Piggyback:124.1] Out: 2350 [Urine:2350]   Intake/Output this shift:  No intake/output data recorded.  Physical Exam: General: NAD, laying in bed  Head: Normocephalic, atraumatic. Moist oral mucosal membranes  Eyes: Anicteric, PERRL  Neck: Supple, trachea midline  Lungs:  Clear to auscultation  Heart: Regular rate and rhythm  Abdomen:  Soft, nontender,   Extremities:  no peripheral edema.  Neurologic: Nonfocal, moving all four extremities  Skin: No lesions  Access: none    Basic Metabolic Panel: Recent Labs  Lab 06/11/20 0830 06/12/20 0541 06/13/20 0532  NA 133* 135 133*  K 3.4* 3.7 3.7  CL 92* 95* 93*  CO2 25 24 26   GLUCOSE 211* 162* 153*  BUN 110* 104* 116*  CREATININE 4.24* 3.82* 3.92*  CALCIUM 8.4* 8.3* 8.3*  MG 2.6*  --   --     Liver Function Tests: No results for input(s): AST, ALT, ALKPHOS, BILITOT, PROT, ALBUMIN in the last 168 hours. No results for input(s): LIPASE, AMYLASE in the last 168 hours. No results for input(s): AMMONIA in the last 168 hours.  CBC: Recent Labs  Lab 06/11/20 0830 06/12/20 0541 06/13/20 0532  WBC 14.4* 10.5 12.3*  NEUTROABS 12.4*  --  9.7*  HGB 8.6* 8.6* 8.2*  HCT 27.9* 29.2* 26.8*  MCV 90.6 92.1 89.3  PLT 259 217 214    Cardiac Enzymes: No results for input(s): CKTOTAL,  CKMB, CKMBINDEX, TROPONINI in the last 168 hours.  BNP: Invalid input(s): POCBNP  CBG: Recent Labs  Lab 06/12/20 2302 06/13/20 0819 06/13/20 1050 06/13/20 1421 06/13/20 1631  GLUCAP 193* 143* 111* 154* 146*    Microbiology: Results for orders placed or performed during the hospital encounter of 06/11/20  Resp Panel by RT-PCR (Flu A&B, Covid) Nasopharyngeal Swab     Status: None   Collection Time: 06/11/20  8:30 AM   Specimen: Nasopharyngeal Swab; Nasopharyngeal(NP) swabs in vial transport medium  Result Value Ref Range Status   SARS Coronavirus 2 by RT PCR NEGATIVE NEGATIVE Final    Comment: (NOTE) SARS-CoV-2 target nucleic acids are NOT DETECTED.  The SARS-CoV-2 RNA is generally detectable in upper respiratory specimens during the acute phase of infection. The lowest concentration of SARS-CoV-2 viral copies this assay can detect is 138 copies/mL. A negative result does not preclude SARS-Cov-2 infection and should not be used as the sole basis for treatment or other patient management decisions. A negative result may occur with  improper specimen collection/handling, submission of specimen other than nasopharyngeal swab, presence of viral mutation(s) within the areas targeted by this assay, and inadequate number of viral copies(<138 copies/mL). A negative result must be combined with clinical observations, patient history, and epidemiological information. The expected result is Negative.  Fact Sheet for Patients:  EntrepreneurPulse.com.au  Fact Sheet for  Healthcare Providers:  IncredibleEmployment.be  This test is no t yet approved or cleared by the [place] and  has been authorized for detection and/or diagnosis of SARS-CoV-2 by FDA under an Emergency Use Authorization (EUA). This EUA will remain  in effect (meaning this test can be used) for the duration of the COVID-19 declaration under Section 564(b)(1) of the Act,  21 U.S.C.section 360bbb-3(b)(1), unless the authorization is terminated  or revoked sooner.       Influenza A by PCR NEGATIVE NEGATIVE Final   Influenza B by PCR NEGATIVE NEGATIVE Final    Comment: (NOTE) The Xpert Xpress SARS-CoV-2/FLU/RSV plus assay is intended as an aid in the diagnosis of influenza from Nasopharyngeal swab specimens and should not be used as a sole basis for treatment. Nasal washings and aspirates are unacceptable for Xpert Xpress SARS-CoV-2/FLU/RSV testing.  Fact Sheet for Patients: EntrepreneurPulse.com.au  Fact Sheet for Healthcare Providers: IncredibleEmployment.be  This test is not yet approved or cleared by the [place] FDA and has been authorized for detection and/or diagnosis of SARS-CoV-2 by FDA under an Emergency Use Authorization (EUA). This EUA will remain in effect (meaning this test can be used) for the duration of the COVID-19 declaration under Section 564(b)(1) of the Act, 21 U.S.C. section 360bbb-3(b)(1), unless the authorization is terminated or revoked.  Performed at [facility], [address] [place], [place] [address]   MRSA PCR Screening     Status: None   Collection Time: 06/13/20  5:40 AM   Specimen: Nasal Mucosa; Nasopharyngeal  Result Value Ref Range Status   MRSA by PCR NEGATIVE NEGATIVE Final    Comment:        The GeneXpert MRSA Assay (FDA approved for NASAL specimens only), is one component of a comprehensive MRSA colonization surveillance program. It is not intended to diagnose MRSA infection nor to guide or monitor treatment for MRSA infections. Performed at [facility], [address] [place], [place] [address]    *Note: Due to a large number of results and/or encounters for the requested time period, some results have not been displayed. A complete set of results can be found in Results Review.    Coagulation Studies: Recent Labs     06/11/20 0830  LABPROT 14.0  INR 1.1    Urinalysis: Recent Labs    06/11/20 1257  COLORURINE YELLOW*  LABSPEC 1.016  PHURINE 5.0  GLUCOSEU NEGATIVE  HGBUR NEGATIVE  BILIRUBINUR NEGATIVE  KETONESUR NEGATIVE  PROTEINUR 100*  NITRITE NEGATIVE  LEUKOCYTESUR NEGATIVE      Imaging: PERIPHERAL VASCULAR CATHETERIZATION  Result Date: 06/13/2020 See op note  DG Chest Port 1 View  Result Date: 06/13/2020 CLINICAL DATA:  PICC line placement EXAM: PORTABLE CHEST 1 VIEW COMPARISON:  05/20/2020 FINDINGS: Right neck PICC line tip in the SVC 3 cm above the right atrium. No pneumothorax. Mild fluid overload/congestive heart failure. Poor inspiration with some volume loss at both lung bases. Previous median sternotomy and CABG. IMPRESSION: 1. PICC line well positioned with tip in the SVC 3 cm above the right atrium. 2. Mild fluid overload/congestive heart failure. Electronically Signed   By: Nelson Chimes M.D.   On: 06/13/2020 11:46     Medications:   . sodium chloride 10 mL/hr at 06/13/20 0934  . furosemide Stopped (06/13/20 0139)  . iron sucrose    . phenylephrine (NEO-SYNEPHRINE) Adult infusion 20 mcg/min (06/13/20 1630)   . ARIPiprazole  2 mg Oral QHS  . aspirin EC  81  mg Oral Daily  . B-complex with vitamin C  1 tablet Oral Daily  . busPIRone  20 mg Oral BID  . calcitRIOL  0.25 mcg Oral Q M,W,F  . carvedilol  3.125 mg Oral BID WC  . Chlorhexidine Gluconate Cloth  6 each Topical Daily  . famotidine      . feeding supplement  237 mL Oral Q24H  . finasteride  5 mg Oral Daily  . gabapentin  100 mg Oral QHS  . heparin  5,000 Units Subcutaneous Q8H  . insulin aspart  0-5 Units Subcutaneous QHS  . insulin aspart  0-9 Units Subcutaneous TID WC  . isosorbide mononitrate  30 mg Oral Daily  . nicotine  21 mg Transdermal Daily  . rosuvastatin  10 mg Oral Daily  . sertraline  100 mg Oral Daily  . tamsulosin  0.4 mg Oral Daily   sodium chloride, acetaminophen, albuterol, alum & mag  hydroxide-simeth, dextromethorphan-guaiFENesin, hydrALAZINE, morphine injection, nitroGLYCERIN, ondansetron (ZOFRAN) IV, oxyCODONE-acetaminophen, polyvinyl alcohol  Assessment/ Plan:  [name] is a 64 y.o. white male with peripheral vascular disease, hypertension, depression, severe aortic stenosis, diabetes mellitus type II, COPD, coronary artery disease, congestive heart failure who was admitted to [facility] on 06/11/2020 for Bilateral leg pain [M79.604, M79.605] Left arm pain [M79.602] Steal syndrome as complication of dialysis access ([place]) [T82.898A]  1. Chronic kidney disease stage V: with complication of dialysis device. Status post ligation of AVF today by Dr. Lucky Cowboy. Patient with uremia and will need to start dialysis soon. Plan on catheter placement and initiation of dialysis on this admission.   2. Anemia of chronic kidney disease: hemoglobin 8.2. Patient will benefit from ESA.   3. Hypertension: blood pressure at goal. Current regimen of furosemide, carvedilol, tamsulosin, and isosorbide mononitrate.   4. Secondary Hyperparathyroidism: PTH 104 on 05/16/20.  - hold calcitriol.    LOS: 2 [name] [name] 11/22/20214:58 PM

## 2020-06-13 NOTE — Op Note (Signed)
[facility]    OPERATIVE NOTE   PROCEDURE: 1.   Left brachiocephalic arteriovenous fistula cannulation under ultrasound guidance 2.   Left arm fistulagram  3.   Coil embolization of the cephalic vein to close off the AV fistula due to steal syndrome with a total of 6 Ruby coils  PRE-OPERATIVE DIAGNOSIS: 1. ESRD 2.  Steal syndrome with profound left hand ischemia despite arterial revascularization  POST-OPERATIVE DIAGNOSIS: same as above   SURGEON: Leotis Pain, MD  ANESTHESIA: General  ESTIMATED BLOOD LOSS: 3 cc  FINDING(S): 1. Widely patent fistula with a large branch in the mid upper arm  SPECIMEN(S):  None  CONTRAST: 15 cc  FLUORO TIME: 3.2 minutes  INDICATIONS: [name] [name] is a 64 y.o. male who presents with profound steal syndrome of the left hand despite arterial revascularization.  His fistula is only 53 weeks old but is large and patent and is clearly creating ischemia to the hand due to the large amount of flow through the fistula.  This will need to be occluded.  He is brought in for coil embolization to occlude the fistula. The patient is aware of the risks of the procedure and elects to proceed forward.  DESCRIPTION: After full informed written consent was obtained, the patient was brought back to the angiography suite and placed supine upon the angiography table.  The patient was connected to monitoring equipment. Moderate conscious sedation was administered with a face to face encounter with the patient throughout the procedure with my supervision of the RN administering medicines and monitoring the patient's vital signs and mental status throughout from the start of the procedure until the patient was taken to the recovery room. The left arm was prepped and draped in the standard fashion for a percutaneous access intervention.  Under ultrasound guidance, the proximal upper arm portion of the cephalic vein portion of the brachiocephalic  arteriovenous fistula was cannulated with a micropuncture needle under direct ultrasound guidance in a retrograde fashion where it was patent and a permanent image was performed.  The microwire was advanced into the fistula and the needle was exchanged for the a microsheath.  I then upsized to a 6 Fr Sheath and imaging was performed.  Hand injections were completed to image the access.  There was a widely patent fistula with a large branch in the mid upper arm.  I then began deploying coils just a couple of centimeters beyond the anastomosis back across this branch in the upper arm to occluded as well.  Through a Kumpe catheter and then a prograde microcatheter imaging was done.  The coils were delivered through the prograde microcatheter.  A total of 6 Ruby coils were used.  The first was a pod 10.  I then used a pod 12, a 12 mm x 60 cm standard, and another pod 12.  The last 2 coils were 60 cm packing coils.  With this, the fistula appeared completely occluded and some of the coils actually went out the branch as well and were across the branch to occlude this.   Based on the completion imaging, no further intervention is necessary.  The catheters and wires were removed.  A 4-0 Monocryl purse-string suture was sewn around the sheath.  The sheath was removed while tying down the suture.  A sterile bandage was applied to the puncture site.  COMPLICATIONS: None  CONDITION: Stable   Leotis Pain  06/13/2020 10:22 AM   This note was created with  [employer] transcription system. Any errors in dictation are purely unintentional.

## 2020-06-13 NOTE — OR Nursing (Signed)
No pepcid needed per dr dew. Solumedrol and bendryl iv given for contrast allergy. Telemetry box and  Dentures removed and sent with pt to vascular lab with pt.

## 2020-06-13 NOTE — Anesthesia Postprocedure Evaluation (Signed)
Anesthesia Post Note  Patient: JYRON TURMAN  Procedure(s) Performed: EMBOLIZATION of left arm AF (Left )  Patient location during evaluation: PACU Anesthesia Type: General Level of consciousness: awake and alert Pain management: pain level controlled Vital Signs Assessment: vitals unstable Respiratory status: spontaneous breathing, nonlabored ventilation and respiratory function stable Cardiovascular status: unstable (hypotensive, requiring vasopressor support) Postop Assessment: no signs of nausea or vomiting Anesthetic complications: no   Discussed with surgeon and ICU team, plan to admit to ICU post op given need for vasopressor to maintain BP. Central line placed in PACU due to insufficient access.  No complications documented.   Last Vitals:  Vitals:   06/13/20 1216 06/13/20 1231  BP:  108/62  Pulse:  81  Resp:  18  Temp: (!) 36.1 C   SpO2:  98%    Last Pain:  Vitals:   06/13/20 1216  TempSrc:   PainSc: 0-No pain                 Lore Polka

## 2020-06-13 NOTE — Transfer of Care (Signed)
Immediate Anesthesia Transfer of Care Note  Patient: [name] [name]  Procedure(s) Performed: EMBOLIZATION of left arm AF (Left )  Patient Location: PACU  Anesthesia Type:General  Level of Consciousness: drowsy  Airway & Oxygen Therapy: Patient Spontanous Breathing and Patient connected to face mask oxygen  Post-op Assessment: Report given to RN  Post vital signs: Reviewed  Last Vitals:  Vitals Value Taken Time  BP 117/67 06/13/20 1050  Temp    Pulse 80 06/13/20 1054  Resp 16 06/13/20 1054  SpO2 97 % 06/13/20 1054  Vitals shown include unvalidated device data.  Last Pain:  Vitals:   06/13/20 1041  TempSrc:   PainSc: Asleep      Patients Stated Pain Goal: 0 (83/23/46 [id number])  Complications: No complications documented.

## 2020-06-13 NOTE — Interval H&P Note (Signed)
History and Physical Interval Note:  06/13/2020 9:18 AM  [name] [name]  has presented today for surgery, with the diagnosis of ischemic left hand, steal syndrome.  The various methods of treatment have been discussed with the patient and family. After consideration of risks, benefits and other options for treatment, the patient has consented to  Procedure(s): EMBOLIZATION of left arm AF (Left) as a surgical intervention.  The patient's history has been reviewed, patient examined, no change in status, stable for surgery.  I have reviewed the patient's chart and labs.  Questions were answered to the patient's satisfaction.     Leotis Pain

## 2020-06-13 NOTE — Progress Notes (Addendum)
PROGRESS NOTE    [name] [name]  VVO:[id number] DOB: 09-18-1955 DOA: 06/11/2020 PCP: [name], MD    Brief Narrative:  Sherle Poe a 64 y.o.malewith medical history significant ofcarotid artery stenosis status post left-sided CEA,hypertension, hyperlipidemia, diabetes mellitus, COPD, asthma, stroke, GERD, depression, anxiety, former smoker, OSA, s/p of splenectomy, CAD, CABG, CKD-4/5,dCHF, psoriasis,PVD,who presents with left arm pain and bilateral foot pain. Patient was found to have left arm ischemia due to steal syndrome from AV fistula.  Has been seen by vascular surgeon, scheduled AV fistula ligation on Monday.  11/22.  Coil embolization of cephalic vein to close of AV fistula.  Developed postop hypotension, receiving pressor in stepdown unit.  Assessment & Plan:   Principal Problem:   Left arm pain-due to ischemia secondary to steal syndrome Active Problems:   Hyperlipidemia, mixed   Essential hypertension   PVD (peripheral vascular disease) (HCC)   BPH (benign prostatic hyperplasia)   Type II diabetes mellitus with renal manifestations (HCC)   Chronic heart failure with preserved ejection fraction (HFpEF) (HCC)   CAD (coronary artery disease)   CKD (chronic kidney disease), stage IV (HCC)   Depression   Anemia in chronic kidney disease   COPD (chronic obstructive pulmonary disease) (HCC)   Stroke (HCC)   Hypokalemia   Leukocytosis   Combined congestive systolic and diastolic heart failure ([place])  #1.  Left arm ischemia secondary to AV fistula related to steal syndrome. AV fistula was closed off by surgery.  Still has some left arm swelling.  Pain is better.  2.  Chronic kidney stage IV/V. Followed by nephrology, patient decided not to have a permacath placement.  3.  Postop hypotension. This is due to patient poor LV systolic dysfunction.  Receiving pressors today.  Anticipating weaning off shortly.  4.  Chronic combined systolic and  diastolic congestive heart failure. Ejection fraction 30 to 35%. No exacerbation. Continue home medicines.  5.  Anemia of chronic disease. B12 level normal, severe iron deficiency, will give IV iron.  6.  COPD. Stable.    DVT prophylaxis: Heparin Code Status: Full Family Communication: None Disposition Plan:  .   Status is: Inpatient  Remains inpatient appropriate because:Inpatient level of care appropriate due to severity of illness   Dispo: The patient is from: Home              Anticipated d/c is to: Home              Anticipated d/c date is: 1 day              Patient currently is not medically stable to d/c.        I/O last 3 completed shifts: In: 489.1 [P.O.:360; I.V.:5.1; IV Piggyback:124.1] Out: 2350 [Urine:2350] No intake/output data recorded.     Consultants:   Nephrology, critical care and vascular surgery  Procedures: Coil embolization of cephalic vein to close of AV fistula.  Antimicrobials: None  Subjective: Patient developed hypotension postoperatively.  He does not feel any dizziness or headaches. No segment short of breath or cough. No diarrhea or abdominal pain. No fever or chills.  Objective: Vitals:   06/13/20 1231 06/13/20 1318 06/13/20 1326 06/13/20 1340  BP: 108/62 136/67 93/75 140/73  Pulse: 81 79 80 80  Resp: 18 19 19 15   Temp:  (!) 97.4 F (36.3 C)    TempSrc:      SpO2: 98% 94% 97% 95%  Weight:      Height:  Intake/Output Summary (Last 24 hours) at 06/13/2020 1350 Last data filed at 06/13/2020 0200 Gross per 24 hour  Intake 129.08 ml  Output 800 ml  Net -670.92 ml   Filed Weights   06/11/20 0533 06/11/20 1139  Weight: 102 kg 107.3 kg    Examination:  General exam: Appears calm and comfortable  Respiratory system: Clear to auscultation. Respiratory effort normal. Cardiovascular system: S1 & S2 heard, RRR. No JVD, murmurs, rubs, gallops or clicks. No pedal edema. Gastrointestinal system: Abdomen is  nondistended, soft and nontender. No organomegaly or masses felt. Normal bowel sounds heard. Central nervous system: Alert and oriented x2. No focal neurological deficits. Extremities: Symmetric  Skin: No rashes, lesions or ulcers Psychiatry: Mood & affect appropriate.     Data Reviewed: I have personally reviewed following labs and imaging studies  CBC: Recent Labs  Lab 06/11/20 0830 06/12/20 0541 06/13/20 0532  WBC 14.4* 10.5 12.3*  NEUTROABS 12.4*  --  9.7*  HGB 8.6* 8.6* 8.2*  HCT 27.9* 29.2* 26.8*  MCV 90.6 92.1 89.3  PLT 259 217 127   Basic Metabolic Panel: Recent Labs  Lab 06/11/20 0830 06/12/20 0541 06/13/20 0532  NA 133* 135 133*  K 3.4* 3.7 3.7  CL 92* 95* 93*  CO2 25 24 26   GLUCOSE 211* 162* 153*  BUN 110* 104* 116*  CREATININE 4.24* 3.82* 3.92*  CALCIUM 8.4* 8.3* 8.3*  MG 2.6*  --   --    GFR: Estimated Creatinine Clearance: 21.3 mL/min (A) (by C-G formula based on SCr of 3.92 mg/dL (H)). Liver Function Tests: No results for input(s): AST, ALT, ALKPHOS, BILITOT, PROT, ALBUMIN in the last 168 hours. No results for input(s): LIPASE, AMYLASE in the last 168 hours. No results for input(s): AMMONIA in the last 168 hours. Coagulation Profile: Recent Labs  Lab 06/11/20 0830  INR 1.1   Cardiac Enzymes: No results for input(s): CKTOTAL, CKMB, CKMBINDEX, TROPONINI in the last 168 hours. BNP (last 3 results) No results for input(s): PROBNP in the last 8760 hours. HbA1C: No results for input(s): HGBA1C in the last 72 hours. CBG: Recent Labs  Lab 06/12/20 1159 06/12/20 1715 06/12/20 2302 06/13/20 0819 06/13/20 1050  GLUCAP 186* 175* 193* 143* 111*   Lipid Profile: No results for input(s): CHOL, HDL, LDLCALC, TRIG, CHOLHDL, LDLDIRECT in the last 72 hours. Thyroid Function Tests: No results for input(s): TSH, T4TOTAL, FREET4, T3FREE, THYROIDAB in the last 72 hours. Anemia Panel: Recent Labs    06/12/20 0541  VITAMINB12 1,441*  TIBC 384  IRON  20*   Sepsis Labs: No results for input(s): PROCALCITON, LATICACIDVEN in the last 168 hours.  Recent Results (from the past 240 hour(s))  Resp Panel by RT-PCR (Flu A&B, Covid) Nasopharyngeal Swab     Status: None   Collection Time: 06/11/20  8:30 AM   Specimen: Nasopharyngeal Swab; Nasopharyngeal(NP) swabs in vial transport medium  Result Value Ref Range Status   SARS Coronavirus 2 by RT PCR NEGATIVE NEGATIVE Final    Comment: (NOTE) SARS-CoV-2 target nucleic acids are NOT DETECTED.  The SARS-CoV-2 RNA is generally detectable in upper respiratory specimens during the acute phase of infection. The lowest concentration of SARS-CoV-2 viral copies this assay can detect is 138 copies/mL. A negative result does not preclude SARS-Cov-2 infection and should not be used as the sole basis for treatment or other patient management decisions. A negative result may occur with  improper specimen collection/handling, submission of specimen other than nasopharyngeal swab, presence of viral mutation(s)  within the areas targeted by this assay, and inadequate number of viral copies(<138 copies/mL). A negative result must be combined with clinical observations, patient history, and epidemiological information. The expected result is Negative.  Fact Sheet for Patients:  EntrepreneurPulse.com.au  Fact Sheet for Healthcare Providers:  IncredibleEmployment.be  This test is no t yet approved or cleared by the [place] FDA and  has been authorized for detection and/or diagnosis of SARS-CoV-2 by FDA under an Emergency Use Authorization (EUA). This EUA will remain  in effect (meaning this test can be used) for the duration of the COVID-19 declaration under Section 564(b)(1) of the Act, 21 U.S.C.section 360bbb-3(b)(1), unless the authorization is terminated  or revoked sooner.       Influenza A by PCR NEGATIVE NEGATIVE Final   Influenza B by PCR NEGATIVE  NEGATIVE Final    Comment: (NOTE) The Xpert Xpress SARS-CoV-2/FLU/RSV plus assay is intended as an aid in the diagnosis of influenza from Nasopharyngeal swab specimens and should not be used as a sole basis for treatment. Nasal washings and aspirates are unacceptable for Xpert Xpress SARS-CoV-2/FLU/RSV testing.  Fact Sheet for Patients: EntrepreneurPulse.com.au  Fact Sheet for Healthcare Providers: IncredibleEmployment.be  This test is not yet approved or cleared by the [place] FDA and has been authorized for detection and/or diagnosis of SARS-CoV-2 by FDA under an Emergency Use Authorization (EUA). This EUA will remain in effect (meaning this test can be used) for the duration of the COVID-19 declaration under Section 564(b)(1) of the Act, 21 U.S.C. section 360bbb-3(b)(1), unless the authorization is terminated or revoked.  Performed at [facility], [address] [place], [place] [address]   MRSA PCR Screening     Status: None   Collection Time: 06/13/20  5:40 AM   Specimen: Nasal Mucosa; Nasopharyngeal  Result Value Ref Range Status   MRSA by PCR NEGATIVE NEGATIVE Final    Comment:        The GeneXpert MRSA Assay (FDA approved for NASAL specimens only), is one component of a comprehensive MRSA colonization surveillance program. It is not intended to diagnose MRSA infection nor to guide or monitor treatment for MRSA infections. Performed at [facility], [address] [place], [place] [address]          Radiology Studies: PERIPHERAL VASCULAR CATHETERIZATION  Result Date: 06/13/2020 See op note  DG Chest Port 1 View  Result Date: 06/13/2020 CLINICAL DATA:  PICC line placement EXAM: PORTABLE CHEST 1 VIEW COMPARISON:  05/20/2020 FINDINGS: Right neck PICC line tip in the SVC 3 cm above the right atrium. No pneumothorax. Mild fluid overload/congestive heart failure. Poor inspiration with some volume  loss at both lung bases. Previous median sternotomy and CABG. IMPRESSION: 1. PICC line well positioned with tip in the SVC 3 cm above the right atrium. 2. Mild fluid overload/congestive heart failure. Electronically Signed   By: Nelson Chimes M.D.   On: 06/13/2020 11:46        Scheduled Meds: . [MAR Hold] ARIPiprazole  2 mg Oral QHS  . [MAR Hold] aspirin EC  81 mg Oral Daily  . [MAR Hold] B-complex with vitamin C  1 tablet Oral Daily  . [MAR Hold] busPIRone  20 mg Oral BID  . [MAR Hold] calcitRIOL  0.25 mcg Oral Q M,W,F  . [MAR Hold] carvedilol  3.125 mg Oral BID WC  . famotidine      . [MAR Hold] feeding supplement  237 mL Oral Q24H  . [MAR Hold] finasteride  5 mg Oral Daily  . [MAR Hold] gabapentin  100 mg Oral QHS  . [MAR Hold] heparin  5,000 Units Subcutaneous Q8H  . [MAR Hold] insulin aspart  0-5 Units Subcutaneous QHS  . [MAR Hold] insulin aspart  0-9 Units Subcutaneous TID WC  . [MAR Hold] isosorbide mononitrate  30 mg Oral Daily  . [MAR Hold] nicotine  21 mg Transdermal Daily  . [MAR Hold] rosuvastatin  10 mg Oral Daily  . [MAR Hold] sertraline  100 mg Oral Daily  . [MAR Hold] tamsulosin  0.4 mg Oral Daily   Continuous Infusions: . sodium chloride    . [MAR Hold] sodium chloride 10 mL/hr at 06/13/20 0934  . [MAR Hold] furosemide Stopped (06/13/20 0139)  . phenylephrine (NEO-SYNEPHRINE) Adult infusion 80 mcg/min (06/13/20 1341)     LOS: 2 days    Time spent: 35 minutes    Sharen Hones, MD [facility]   To contact the attending provider between 7A-7P or the covering provider during after hours 7P-7A, please log into the web site [email or web address] and access using universal [place] password for that web site. If you do not have the password, please call the hospital operator.  06/13/2020, 1:50 PM

## 2020-06-13 NOTE — Anesthesia Preprocedure Evaluation (Signed)
Anesthesia Evaluation  Patient identified by MRN, date of birth, ID band Patient awake    Reviewed: Allergy & Precautions, NPO status , Patient's Chart, lab work & pertinent test results  History of Anesthesia Complications Negative for: history of anesthetic complications  Airway Mallampati: III  TM Distance: >3 FB Neck ROM: Full    Dental  (+) Edentulous Upper   Pulmonary asthma , sleep apnea , COPD,  COPD inhaler, former smoker,    breath sounds clear to auscultation- rhonchi (-) wheezing      Cardiovascular hypertension, + CAD, + Past MI, + Cardiac Stents (03/2020), + CABG (2012) and +CHF   Rhythm:Regular Rate:Normal - Systolic murmurs and - Diastolic murmurs L heart cath 03/23/20:  Ost RCA to Prox RCA lesion is 99% stenosed.  Post intervention, there is a 0% residual stenosis.  A drug-eluting stent was successfully placed using a STENT RESOLUTE ONYX 4.0X18.  Echo 03/22/20:  Ost RCA to Prox RCA lesion is 99% stenosed.  Post intervention, there is a 0% residual stenosis.  A drug-eluting stent was successfully placed using a STENT RESOLUTE ONYX 4.0X18.    Neuro/Psych  Headaches, PSYCHIATRIC DISORDERS Anxiety Depression Bipolar Disorder CVA, Residual Symptoms    GI/Hepatic Neg liver ROS, GERD  ,  Endo/Other  diabetes, Insulin Dependent  Renal/GU CRFRenal disease     Musculoskeletal  (+) Arthritis ,   Abdominal (+) + obese,   Peds  Hematology  (+) anemia ,   Anesthesia Other Findings Past Medical History: No date: 3-vessel CAD     Comment:  8/30 LHC with radial graft to RCA occluded at the ostium              with critical stenosis of the ostial native RCA. No date: Adenomatous colon polyp No date: Allergy No date: Anemia No date: Anxiety No date: Arthritis     Comment:  RA No date: Asthma 2007: Bell's palsy     Comment:  neuropathy No date: CAD (coronary artery disease)     Comment:  03/21/2020 LHC  with patent LIMA to LAD and SVG to OM 3.                Radial graft to RCA occluded at the ostium with critical               stenosis of the ostial and native RCA. No date: Carotid artery occlusion     Comment:  Bilateral carotid stenosis s/p left-sided CEA. No date: Cataract     Comment:  Dr. Dawna Part No date: CKD (chronic kidney disease), stage IV ([place])     Comment:  03/2020 vascular surgery access planned No date: Depression No date: Diabetes mellitus No date: Diverticulosis No date: Duodenitis No date: DVT (deep venous thrombosis) (HCC)     Comment:  in leg No date: Dyspnea     Comment:  with edema No date: ESRD (end stage renal disease) ([place]) No date: Gastropathy No date: GERD (gastroesophageal reflux disease) No date: Heart failure with preserved ejection fraction ([place])     Comment:  8/30 RHC with moderately to severely elevated filling               pressures and pulmonary wedge pressure 31 mmHg, moderate               pulmonary hypertension No date: Heart murmur No date: Helicobacter pylori gastritis No date: Hx of CABG     Comment:  2012 No date: Hyperlipidemia No date: Hypertension  No date: Mitral regurgitation and aortic stenosis     Comment:  RHC 8/30 and echo 12/2019. 2021: Myocardial infarction ([place]) No date: Neuropathy No date: Obesity No date: Peripheral vascular disease (HCC)     Comment:  S/p extensive revascularization by vascular surgery               05/2019 No date: Post splenectomy syndrome No date: Psoriasis No date: Severe aortic stenosis     Comment:  03/21/20 RHC with peak gradient 27.4 mmHg and valve area               0.87. No date: Sleep apnea     Comment:  uses cpap No date: Stroke [facility]) No date: Tobacco use disorder     Comment:  recently quit 08/2013: Tubular adenoma     Comment:  Dr. Hilarie Fredrickson No date: Weak urinary stream   Reproductive/Obstetrics                             Anesthesia Physical Anesthesia  Plan  ASA: III  Anesthesia Plan: General   Post-op Pain Management:    Induction: Intravenous  PONV Risk Score and Plan: 1  Airway Management Planned: LMA  Additional Equipment:   Intra-op Plan:   Post-operative Plan:   Informed Consent: I have reviewed the patients History and Physical, chart, labs and discussed the procedure including the risks, benefits and alternatives for the proposed anesthesia with the patient or authorized representative who has indicated his/her understanding and acceptance.     Dental advisory given  Plan Discussed with: CRNA and Anesthesiologist  Anesthesia Plan Comments:         Anesthesia Quick Evaluation

## 2020-06-13 NOTE — Anesthesia Procedure Notes (Signed)
Procedure Name: LMA Insertion Date/Time: 06/13/2020 9:53 AM Performed by: Lerry Liner, CRNA Pre-anesthesia Checklist: Patient identified, Emergency Drugs available, Suction available and Patient being monitored Patient Re-evaluated:Patient Re-evaluated prior to induction Oxygen Delivery Method: Circle system utilized Preoxygenation: Pre-oxygenation with 100% oxygen Induction Type: IV induction Ventilation: Mask ventilation without difficulty LMA: LMA inserted LMA Size: 4.5 Number of attempts: 1 Tube secured with: Tape Dental Injury: Teeth and Oropharynx as per pre-operative assessment

## 2020-06-14 ENCOUNTER — Encounter: Payer: Self-pay | Admitting: Vascular Surgery

## 2020-06-14 DIAGNOSIS — N184 Chronic kidney disease, stage 4 (severe): Secondary | ICD-10-CM | POA: Diagnosis not present

## 2020-06-14 DIAGNOSIS — Z515 Encounter for palliative care: Secondary | ICD-10-CM

## 2020-06-14 DIAGNOSIS — I9589 Other hypotension: Secondary | ICD-10-CM | POA: Diagnosis not present

## 2020-06-14 DIAGNOSIS — M79602 Pain in left arm: Secondary | ICD-10-CM | POA: Diagnosis not present

## 2020-06-14 DIAGNOSIS — Z7189 Other specified counseling: Secondary | ICD-10-CM | POA: Diagnosis not present

## 2020-06-14 DIAGNOSIS — D631 Anemia in chronic kidney disease: Secondary | ICD-10-CM | POA: Diagnosis not present

## 2020-06-14 LAB — CBC WITH DIFFERENTIAL/PLATELET
Abs Immature Granulocytes: 0.1 10*3/uL — ABNORMAL HIGH (ref 0.00–0.07)
Basophils Absolute: 0 10*3/uL (ref 0.0–0.1)
Basophils Relative: 0 %
Eosinophils Absolute: 0 10*3/uL (ref 0.0–0.5)
Eosinophils Relative: 0 %
HCT: 24.6 % — ABNORMAL LOW (ref 39.0–52.0)
Hemoglobin: 7.4 g/dL — ABNORMAL LOW (ref 13.0–17.0)
Immature Granulocytes: 1 %
Lymphocytes Relative: 6 %
Lymphs Abs: 1 10*3/uL (ref 0.7–4.0)
MCH: 27.3 pg (ref 26.0–34.0)
MCHC: 30.1 g/dL (ref 30.0–36.0)
MCV: 90.8 fL (ref 80.0–100.0)
Monocytes Absolute: 1.2 10*3/uL — ABNORMAL HIGH (ref 0.1–1.0)
Monocytes Relative: 7 %
Neutro Abs: 13.9 10*3/uL — ABNORMAL HIGH (ref 1.7–7.7)
Neutrophils Relative %: 86 %
Platelets: 193 10*3/uL (ref 150–400)
RBC: 2.71 MIL/uL — ABNORMAL LOW (ref 4.22–5.81)
RDW: 15.8 % — ABNORMAL HIGH (ref 11.5–15.5)
WBC: 16.2 10*3/uL — ABNORMAL HIGH (ref 4.0–10.5)
nRBC: 0.9 % — ABNORMAL HIGH (ref 0.0–0.2)

## 2020-06-14 LAB — HEPATITIS B DNA, ULTRAQUANTITATIVE, PCR
HBV DNA SERPL PCR-ACNC: NOT DETECTED IU/mL
HBV DNA SERPL PCR-LOG IU: UNDETERMINED log10 IU/mL

## 2020-06-14 LAB — PHOSPHORUS: Phosphorus: 7 mg/dL — ABNORMAL HIGH (ref 2.5–4.6)

## 2020-06-14 LAB — HEPATITIS B SURFACE ANTIBODY, QUANTITATIVE: Hep B S AB Quant (Post): <3.1 m[IU]/mL — ABNORMAL LOW (ref >9.9)

## 2020-06-14 LAB — BASIC METABOLIC PANEL
Anion gap: 13 (ref 5–15)
BUN: 108 mg/dL — ABNORMAL HIGH (ref 8–23)
CO2: 26 mmol/L (ref 22–32)
Calcium: 8.3 mg/dL — ABNORMAL LOW (ref 8.9–10.3)
Chloride: 96 mmol/L — ABNORMAL LOW (ref 98–111)
Creatinine, Ser: 4.1 mg/dL — ABNORMAL HIGH (ref 0.61–1.24)
GFR, Estimated: 15 mL/min — ABNORMAL LOW (ref >60)
Glucose, Bld: 142 mg/dL — ABNORMAL HIGH (ref 70–99)
Potassium: 4 mmol/L (ref 3.5–5.1)
Sodium: 135 mmol/L (ref 135–145)

## 2020-06-14 LAB — GLUCOSE, CAPILLARY
Glucose-Capillary: 126 mg/dL — ABNORMAL HIGH (ref 70–99)
Glucose-Capillary: 136 mg/dL — ABNORMAL HIGH (ref 70–99)
Glucose-Capillary: 234 mg/dL — ABNORMAL HIGH (ref 70–99)
Glucose-Capillary: 189 mg/dL — ABNORMAL HIGH (ref 70–99)

## 2020-06-14 LAB — MAGNESIUM: Magnesium: 2.4 mg/dL (ref 1.7–2.4)

## 2020-06-14 NOTE — Progress Notes (Signed)
Pt transferred to RM 220. Report given to receiving RN. VSS prior to transfer.

## 2020-06-14 NOTE — Progress Notes (Signed)
[facility] Liaison note: Patient is currently followed by [facility] outpatient Palliative services at home. [place] notified.  Will follow for disposition. Thank you.  Flo Shanks BSN, RN, [facility] [employer] [phone]

## 2020-06-14 NOTE — Progress Notes (Signed)
Patient's son, Waunita Schooner, called. He was informed that patient had transferred over to room 220.

## 2020-06-14 NOTE — Progress Notes (Addendum)
PROGRESS NOTE    [name] [name]  KGU:[id number] DOB: 11-16-1955 DOA: 06/11/2020 PCP: [name], MD    Brief Narrative:  [name] [name] a 64 y.o.malewith medical history significant ofcarotid artery stenosis status post left-sided CEA,hypertension, hyperlipidemia, diabetes mellitus, COPD, asthma, stroke, GERD, depression, anxiety, former smoker, OSA, s/p of splenectomy, CAD, CABG, CKD-4/5,dCHF, psoriasis,PVD,who presents with left arm pain and bilateral foot pain. Patient was found to have left arm ischemia due to steal syndrome from AV fistula. Has been seen by vascular surgeon, scheduled AV fistula ligation on Monday.  11/22.  Coil embolization of cephalic vein to close of AV fistula.  Developed postop hypotension, receiving pressor in stepdown unit.   Assessment & Plan:   Principal Problem:   Left arm pain-due to ischemia secondary to steal syndrome Active Problems:   Hyperlipidemia, mixed   Essential hypertension   PVD (peripheral vascular disease) (HCC)   BPH (benign prostatic hyperplasia)   Type II diabetes mellitus with renal manifestations (HCC)   Chronic heart failure with preserved ejection fraction (HFpEF) (HCC)   CAD (coronary artery disease)   CKD (chronic kidney disease), stage IV (HCC)   Depression   Anemia in chronic kidney disease   COPD (chronic obstructive pulmonary disease) (HCC)   Stroke (HCC)   Hypokalemia   Leukocytosis   Combined congestive systolic and diastolic heart failure (HCC)   Hypotension  #1. Left arm ischemia secondary to AV fistula related steal syndrome. Status post coil embolization of the cephalic vein. Left arm pain is better.  2. Postop hypotension. Due to systolic LV dysfunction. Patient was on pressor yesterday, weaned off today. Blood pressure has been stable. Will transfer to medical floor for another day of observation before discharge.  3. Chronic kidney disease stage IV/V. Followed by nephrology.  Patient has refused a permacath placement. Palliative care consult obtained.  4. Chronic combined systolic and diastolic congestive heart failure. EF 30 to 35%. Patient is requiring 2 L oxygen since last night. Does not have significant crackles in the base. We will continue to follow for day.  5. Anemia of chronic disease. Iron deficient anemia. Received IV iron for severe iron deficiency. Hemoglobin trending down, recheck a CBC tomorrow. Transfusion with hemoglobin less than 7.0. No evidence of GI bleed.  1409.  Patient has been seen by palliative care, looks like patient changed her mind again, nephrology will place dialysis catheter and start dialysis.    DVT prophylaxis: Heparin Code Status: Full Family Communication: None Disposition Plan:  .   Status is: Inpatient  Remains inpatient appropriate because:Inpatient level of care appropriate due to severity of illness   Dispo: The patient is from: Home              Anticipated d/c is to: Home              Anticipated d/c date is: 1 day              Patient currently is not medically stable to d/c.        I/O last 3 completed shifts: In: 1871.6 [P.O.:720; I.V.:731; IV Piggyback:420.6] Out: 1925 [Urine:1925] Total I/O In: -  Out: 21 [Urine:425]     Consultants:   Nephrology, Vacular surgery  Procedures:   Antimicrobials:None  Subjective: Patient feels better today. Left arm pain is better. On 2 L oxygen, but does not have significant shortness of breath. No abdominal pain or nausea vomiting. No dysuria hematuria.  Objective: Vitals:   06/14/20 0900 06/14/20  1000 06/14/20 1100 06/14/20 1200  BP:  (!) 125/56  128/82  Pulse: 83 78 80 83  Resp: 19 (!) 24 20 (!) 21  Temp:      TempSrc:      SpO2: 91% 94% 95% 94%  Weight:      Height:        Intake/Output Summary (Last 24 hours) at 06/14/2020 1237 Last data filed at 06/14/2020 1213 Gross per 24 hour  Intake 1805.58 ml  Output 1550 ml  Net  255.58 ml   Filed Weights   06/11/20 1139 06/13/20 1432 06/14/20 0500  Weight: 107.3 kg 106.9 kg 105.3 kg    Examination:  General exam: Appears calm and comfortable  Respiratory system: Clear to auscultation. Respiratory effort normal. Cardiovascular system: S1 & S2 heard, RRR. No JVD, murmurs, rubs, gallops or clicks. No pedal edema. Gastrointestinal system: Abdomen is nondistended, soft and nontender. No organomegaly or masses felt. Normal bowel sounds heard. Central nervous system: Alert and oriented. No focal neurological deficits. Extremities: Left arm swelling. Skin: No rashes, lesions or ulcers Psychiatry: Judgement and insight appear normal. Mood & affect appropriate.     Data Reviewed: I have personally reviewed following labs and imaging studies  CBC: Recent Labs  Lab 06/11/20 0830 06/12/20 0541 06/13/20 0532 06/14/20 0423  WBC 14.4* 10.5 12.3* 16.2*  NEUTROABS 12.4*  --  9.7* 13.9*  HGB 8.6* 8.6* 8.2* 7.4*  HCT 27.9* 29.2* 26.8* 24.6*  MCV 90.6 92.1 89.3 90.8  PLT 259 217 214 841   Basic Metabolic Panel: Recent Labs  Lab 06/11/20 0830 06/12/20 0541 06/13/20 0532 06/14/20 0423  NA 133* 135 133* 135  K 3.4* 3.7 3.7 4.0  CL 92* 95* 93* 96*  CO2 25 24 26 26   GLUCOSE 211* 162* 153* 142*  BUN 110* 104* 116* 108*  CREATININE 4.24* 3.82* 3.92* 4.10*  CALCIUM 8.4* 8.3* 8.3* 8.3*  MG 2.6*  --   --  2.4  PHOS  --   --   --  7.0*   GFR: Estimated Creatinine Clearance: 20.2 mL/min (A) (by C-G formula based on SCr of 4.1 mg/dL (H)). Liver Function Tests: No results for input(s): AST, ALT, ALKPHOS, BILITOT, PROT, ALBUMIN in the last 168 hours. No results for input(s): LIPASE, AMYLASE in the last 168 hours. No results for input(s): AMMONIA in the last 168 hours. Coagulation Profile: Recent Labs  Lab 06/11/20 0830  INR 1.1   Cardiac Enzymes: No results for input(s): CKTOTAL, CKMB, CKMBINDEX, TROPONINI in the last 168 hours. BNP (last 3 results) No  results for input(s): PROBNP in the last 8760 hours. HbA1C: No results for input(s): HGBA1C in the last 72 hours. CBG: Recent Labs  Lab 06/13/20 1421 06/13/20 1631 06/13/20 2115 06/14/20 0732 06/14/20 1211  GLUCAP 154* 146* 184* 126* 136*   Lipid Profile: No results for input(s): CHOL, HDL, LDLCALC, TRIG, CHOLHDL, LDLDIRECT in the last 72 hours. Thyroid Function Tests: No results for input(s): TSH, T4TOTAL, FREET4, T3FREE, THYROIDAB in the last 72 hours. Anemia Panel: Recent Labs    06/12/20 0541  VITAMINB12 1,441*  TIBC 384  IRON 20*   Sepsis Labs: No results for input(s): PROCALCITON, LATICACIDVEN in the last 168 hours.  Recent Results (from the past 240 hour(s))  Resp Panel by RT-PCR (Flu A&B, Covid) Nasopharyngeal Swab     Status: None   Collection Time: 06/11/20  8:30 AM   Specimen: Nasopharyngeal Swab; Nasopharyngeal(NP) swabs in vial transport medium  Result Value Ref Range Status  SARS Coronavirus 2 by RT PCR NEGATIVE NEGATIVE Final    Comment: (NOTE) SARS-CoV-2 target nucleic acids are NOT DETECTED.  The SARS-CoV-2 RNA is generally detectable in upper respiratory specimens during the acute phase of infection. The lowest concentration of SARS-CoV-2 viral copies this assay can detect is 138 copies/mL. A negative result does not preclude SARS-Cov-2 infection and should not be used as the sole basis for treatment or other patient management decisions. A negative result may occur with  improper specimen collection/handling, submission of specimen other than nasopharyngeal swab, presence of viral mutation(s) within the areas targeted by this assay, and inadequate number of viral copies(<138 copies/mL). A negative result must be combined with clinical observations, patient history, and epidemiological information. The expected result is Negative.  Fact Sheet for Patients:  EntrepreneurPulse.com.au  Fact Sheet for Healthcare Providers:    IncredibleEmployment.be  This test is no t yet approved or cleared by the [place] FDA and  has been authorized for detection and/or diagnosis of SARS-CoV-2 by FDA under an Emergency Use Authorization (EUA). This EUA will remain  in effect (meaning this test can be used) for the duration of the COVID-19 declaration under Section 564(b)(1) of the Act, 21 U.S.C.section 360bbb-3(b)(1), unless the authorization is terminated  or revoked sooner.       Influenza A by PCR NEGATIVE NEGATIVE Final   Influenza B by PCR NEGATIVE NEGATIVE Final    Comment: (NOTE) The Xpert Xpress SARS-CoV-2/FLU/RSV plus assay is intended as an aid in the diagnosis of influenza from Nasopharyngeal swab specimens and should not be used as a sole basis for treatment. Nasal washings and aspirates are unacceptable for Xpert Xpress SARS-CoV-2/FLU/RSV testing.  Fact Sheet for Patients: EntrepreneurPulse.com.au  Fact Sheet for Healthcare Providers: IncredibleEmployment.be  This test is not yet approved or cleared by the [place] FDA and has been authorized for detection and/or diagnosis of SARS-CoV-2 by FDA under an Emergency Use Authorization (EUA). This EUA will remain in effect (meaning this test can be used) for the duration of the COVID-19 declaration under Section 564(b)(1) of the Act, 21 U.S.C. section 360bbb-3(b)(1), unless the authorization is terminated or revoked.  Performed at [facility], [address] [place], [place] [address]   MRSA PCR Screening     Status: None   Collection Time: 06/13/20  5:40 AM   Specimen: Nasal Mucosa; Nasopharyngeal  Result Value Ref Range Status   MRSA by PCR NEGATIVE NEGATIVE Final    Comment:        The GeneXpert MRSA Assay (FDA approved for NASAL specimens only), is one component of a comprehensive MRSA colonization surveillance program. It is not intended to diagnose MRSA infection  nor to guide or monitor treatment for MRSA infections. Performed at [facility], [address] [place], [place] [address]          Radiology Studies: PERIPHERAL VASCULAR CATHETERIZATION  Result Date: 06/13/2020 See op note  DG Chest Port 1 View  Result Date: 06/13/2020 CLINICAL DATA:  PICC line placement EXAM: PORTABLE CHEST 1 VIEW COMPARISON:  05/20/2020 FINDINGS: Right neck PICC line tip in the SVC 3 cm above the right atrium. No pneumothorax. Mild fluid overload/congestive heart failure. Poor inspiration with some volume loss at both lung bases. Previous median sternotomy and CABG. IMPRESSION: 1. PICC line well positioned with tip in the SVC 3 cm above the right atrium. 2. Mild fluid overload/congestive heart failure. Electronically Signed   By: Nelson Chimes M.D.   On: 06/13/2020  11:46        Scheduled Meds: . ARIPiprazole  2 mg Oral QHS  . aspirin EC  81 mg Oral Daily  . B-complex with vitamin C  1 tablet Oral Daily  . busPIRone  20 mg Oral BID  . carvedilol  3.125 mg Oral BID WC  . Chlorhexidine Gluconate Cloth  6 each Topical Daily  . feeding supplement  237 mL Oral Q24H  . finasteride  5 mg Oral Daily  . gabapentin  100 mg Oral QHS  . heparin  5,000 Units Subcutaneous Q8H  . insulin aspart  0-5 Units Subcutaneous QHS  . insulin aspart  0-9 Units Subcutaneous TID WC  . isosorbide mononitrate  30 mg Oral Daily  . nicotine  21 mg Transdermal Daily  . rosuvastatin  10 mg Oral Daily  . sertraline  100 mg Oral Daily  . tamsulosin  0.4 mg Oral Daily   Continuous Infusions: . sodium chloride 10 mL/hr at 06/14/20 0300  . furosemide 120 mg (06/14/20 1213)  . phenylephrine (NEO-SYNEPHRINE) Adult infusion Stopped (06/13/20 1700)     LOS: 3 days    Time spent: 26 minutes    Sharen Hones, MD [facility]   To contact the attending provider between 7A-7P or the covering provider during after hours 7P-7A, please log into the web site  [email or web address] and access using universal [place] password for that web site. If you do not have the password, please call the hospital operator.  06/14/2020, 12:37 PM

## 2020-06-14 NOTE — Progress Notes (Signed)
[facility]  ROUNDING NOTE   Subjective:   Patient  Sleeping in bed, arousable to call. He is status post ligation of AVF yesterday.    Objective:  Vital signs in last 24 hours:  Temp:  [97 F (36.1 C)-98.9 F (37.2 C)] 97.7 F (36.5 C) (11/23 1553) Pulse Rate:  [78-88] 82 (11/23 1553) Resp:  [15-24] 19 (11/23 1553) BP: (111-160)/(52-97) 132/66 (11/23 1553) SpO2:  [85 %-99 %] 99 % (11/23 1553) Weight:  [105.3 kg] 105.3 kg (11/23 0500)  Weight change:  Filed Weights   06/11/20 1139 06/13/20 1432 06/14/20 0500  Weight: 107.3 kg 106.9 kg 105.3 kg    Intake/Output: I/O last 3 completed shifts: In: 1871.6 [P.O.:720; I.V.:731; IV Piggyback:420.6] Out: 1925 [Urine:1925]   Intake/Output this shift:  Total I/O In: -  Out: 625 [Urine:625]  Physical Exam: General: Sleeping in bed, arousable to call  Head:  Moist oral mucosal membranes  Eyes: Anicteric  Lungs:  Fine crackles +  Heart: Regular rate and rhythm  Abdomen:  Soft, nontender, non distended  Extremities:  1+  peripheral edema.Lt Hand 2+ swelling  Neurologic: Sleeping, answers questions appropriately when awake  Skin: No acute lesions or rashes  Access: To be established    Basic Metabolic Panel: Recent Labs  Lab 06/11/20 0830 06/11/20 0830 06/12/20 0541 06/13/20 0532 06/14/20 0423  NA 133*  --  135 133* 135  K 3.4*  --  3.7 3.7 4.0  CL 92*  --  95* 93* 96*  CO2 25  --  24 26 26   GLUCOSE 211*  --  162* 153* 142*  BUN 110*  --  104* 116* 108*  CREATININE 4.24*  --  3.82* 3.92* 4.10*  CALCIUM 8.4*   < > 8.3* 8.3* 8.3*  MG 2.6*  --   --   --  2.4  PHOS  --   --   --   --  7.0*   < > = values in this interval not displayed.    Liver Function Tests: No results for input(s): AST, ALT, ALKPHOS, BILITOT, PROT, ALBUMIN in the last 168 hours. No results for input(s): LIPASE, AMYLASE in the last 168 hours. No results for input(s): AMMONIA in the last 168 hours.  CBC: Recent Labs  Lab  06/11/20 0830 06/12/20 0541 06/13/20 0532 06/14/20 0423  WBC 14.4* 10.5 12.3* 16.2*  NEUTROABS 12.4*  --  9.7* 13.9*  HGB 8.6* 8.6* 8.2* 7.4*  HCT 27.9* 29.2* 26.8* 24.6*  MCV 90.6 92.1 89.3 90.8  PLT 259 217 214 193    Cardiac Enzymes: No results for input(s): CKTOTAL, CKMB, CKMBINDEX, TROPONINI in the last 168 hours.  BNP: Invalid input(s): POCBNP  CBG: Recent Labs  Lab 06/13/20 1421 06/13/20 1631 06/13/20 2115 06/14/20 0732 06/14/20 1211  GLUCAP 154* 146* 184* 126* 136*    Microbiology: Results for orders placed or performed during the hospital encounter of 06/11/20  Resp Panel by RT-PCR (Flu A&B, Covid) Nasopharyngeal Swab     Status: None   Collection Time: 06/11/20  8:30 AM   Specimen: Nasopharyngeal Swab; Nasopharyngeal(NP) swabs in vial transport medium  Result Value Ref Range Status   SARS Coronavirus 2 by RT PCR NEGATIVE NEGATIVE Final    Comment: (NOTE) SARS-CoV-2 target nucleic acids are NOT DETECTED.  The SARS-CoV-2 RNA is generally detectable in upper respiratory specimens during the acute phase of infection. The lowest concentration of SARS-CoV-2 viral copies this assay can detect is 138 copies/mL. A negative result does  not preclude SARS-Cov-2 infection and should not be used as the sole basis for treatment or other patient management decisions. A negative result may occur with  improper specimen collection/handling, submission of specimen other than nasopharyngeal swab, presence of viral mutation(s) within the areas targeted by this assay, and inadequate number of viral copies(<138 copies/mL). A negative result must be combined with clinical observations, patient history, and epidemiological information. The expected result is Negative.  Fact Sheet for Patients:  EntrepreneurPulse.com.au  Fact Sheet for Healthcare Providers:  IncredibleEmployment.be  This test is no t yet approved or cleared by the [place] FDA and  has been authorized for detection and/or diagnosis of SARS-CoV-2 by FDA under an Emergency Use Authorization (EUA). This EUA will remain  in effect (meaning this test can be used) for the duration of the COVID-19 declaration under Section 564(b)(1) of the Act, 21 U.S.C.section 360bbb-3(b)(1), unless the authorization is terminated  or revoked sooner.       Influenza A by PCR NEGATIVE NEGATIVE Final   Influenza B by PCR NEGATIVE NEGATIVE Final    Comment: (NOTE) The Xpert Xpress SARS-CoV-2/FLU/RSV plus assay is intended as an aid in the diagnosis of influenza from Nasopharyngeal swab specimens and should not be used as a sole basis for treatment. Nasal washings and aspirates are unacceptable for Xpert Xpress SARS-CoV-2/FLU/RSV testing.  Fact Sheet for Patients: EntrepreneurPulse.com.au  Fact Sheet for Healthcare Providers: IncredibleEmployment.be  This test is not yet approved or cleared by the [place] FDA and has been authorized for detection and/or diagnosis of SARS-CoV-2 by FDA under an Emergency Use Authorization (EUA). This EUA will remain in effect (meaning this test can be used) for the duration of the COVID-19 declaration under Section 564(b)(1) of the Act, 21 U.S.C. section 360bbb-3(b)(1), unless the authorization is terminated or revoked.  Performed at [facility], [address] [place], [place] [address]   MRSA PCR Screening     Status: None   Collection Time: 06/13/20  5:40 AM   Specimen: Nasal Mucosa; Nasopharyngeal  Result Value Ref Range Status   MRSA by PCR NEGATIVE NEGATIVE Final    Comment:        The GeneXpert MRSA Assay (FDA approved for NASAL specimens only), is one component of a comprehensive MRSA colonization surveillance program. It is not intended to diagnose MRSA infection nor to guide or monitor treatment for MRSA infections. Performed at [facility], [address] [place], [place] [address]    *Note: Due to a large number of results and/or encounters for the requested time period, some results have not been displayed. A complete set of results can be found in Results Review.    Coagulation Studies: No results for input(s): LABPROT, INR in the last 72 hours.  Urinalysis: No results for input(s): COLORURINE, LABSPEC, PHURINE, GLUCOSEU, HGBUR, BILIRUBINUR, KETONESUR, PROTEINUR, UROBILINOGEN, NITRITE, LEUKOCYTESUR in the last 72 hours.  Invalid input(s): APPERANCEUR    Imaging: PERIPHERAL VASCULAR CATHETERIZATION  Result Date: 06/13/2020 See op note  DG Chest Port 1 View  Result Date: 06/13/2020 CLINICAL DATA:  PICC line placement EXAM: PORTABLE CHEST 1 VIEW COMPARISON:  05/20/2020 FINDINGS: Right neck PICC line tip in the SVC 3 cm above the right atrium. No pneumothorax. Mild fluid overload/congestive heart failure. Poor inspiration with some volume loss at both lung bases. Previous median sternotomy and CABG. IMPRESSION: 1. PICC line well positioned with tip in the SVC 3 cm above the right atrium. 2. Mild fluid overload/congestive heart failure.  Electronically Signed   By: Nelson Chimes M.D.   On: 06/13/2020 11:46     Medications:   . sodium chloride 10 mL/hr at 06/14/20 0300  . furosemide 120 mg (06/14/20 1213)  . phenylephrine (NEO-SYNEPHRINE) Adult infusion Stopped (06/13/20 1700)   . ARIPiprazole  2 mg Oral QHS  . aspirin EC  81 mg Oral Daily  . B-complex with vitamin C  1 tablet Oral Daily  . busPIRone  20 mg Oral BID  . carvedilol  3.125 mg Oral BID WC  . Chlorhexidine Gluconate Cloth  6 each Topical Daily  . feeding supplement  237 mL Oral Q24H  . finasteride  5 mg Oral Daily  . gabapentin  100 mg Oral QHS  . heparin  5,000 Units Subcutaneous Q8H  . insulin aspart  0-5 Units Subcutaneous QHS  . insulin aspart  0-9 Units Subcutaneous TID WC  . isosorbide mononitrate  30 mg Oral Daily  . nicotine  21 mg Transdermal  Daily  . rosuvastatin  10 mg Oral Daily  . sertraline  100 mg Oral Daily  . tamsulosin  0.4 mg Oral Daily   sodium chloride, acetaminophen, albuterol, alum & mag hydroxide-simeth, dextromethorphan-guaiFENesin, hydrALAZINE, morphine injection, nitroGLYCERIN, ondansetron (ZOFRAN) IV, oxyCODONE-acetaminophen, polyvinyl alcohol  Assessment/ Plan:  [name] [name] is a 64 y.o. white male with peripheral vascular disease, hypertension, depression, severe aortic stenosis, diabetes mellitus type II, COPD, coronary artery disease, congestive heart failure who was admitted to [facility] on 06/11/2020 for Bilateral leg pain [M79.604, M79.605] Left arm pain [M79.602] Steal syndrome as complication of dialysis access ([place]) [E67.544B]  #. Chronic kidney disease stage V:  with complication of dialysis device.  Lab Results  Component Value Date   CREATININE 4.10 (H) 06/14/2020   CREATININE 3.92 (H) 06/13/2020   CREATININE 3.82 (H) 06/12/2020   Status post ligation of AVF today by Dr. Lucky Cowboy. Discussed with patient regarding need for urgent dialysis,explaining his lab values   Patient too sleepy to make decision at this time, will discuss again when patient is more awake and alert  # Anemia of chronic kidney disease:  Lab Results  Component Value Date   HGB 7.4 (L) 06/14/2020  May consider PRBC transfusion of Hgb below 7  #Hypertension:  Normotensive today Continue current antihypertensive regimen   4. Secondary Hyperparathyroidism:  Lab Results  Component Value Date   CALCIUM 8.3 (L) 06/14/2020   CAION 1.06 (L) 07/02/2016   PHOS 7.0 (H) 06/14/2020  Will continue monitoring bone mineral metabolism parameters    LOS: 3 Paige Monarrez 11/23/20214:17 PM

## 2020-06-14 NOTE — Progress Notes (Signed)
Folling the patient   It is now clear that he needs dialysis and given the difficulty the other day with sedation that proceeded to general I would plan to place a temp cath.  However, if he were to transition to comfort care this would not be indicated  I will wait until after the 9:00 pm meeting to see how to proceed

## 2020-06-14 NOTE — Progress Notes (Addendum)
Follow up - Critical Care Medicine Note  Patient Details:    [name] [name] is an 64 y.o. male with a very complex medical history to include stage V renal failure and severe aortic stenosis underwent embolization of LV fistula access due to steal syndrome.  Postoperatively developed hypotension.  Required Neo-Synephrine briefly.  PCCM asked to consult to manage issues with hypotension.  Lines, Airways, Drains: Airway (Active)     CVC Triple Lumen 06/13/20 Internal jugular (Active)  Indication for Insertion or Continuance of Line Limited venous access - need for IV therapy >5 days (PICC only) 06/14/20 0800  Site Assessment Clean;Dry;Intact 06/14/20 0800  Proximal Lumen Status Flushed;Saline locked 06/14/20 0800  Medial Lumen Status Flushed;Saline locked 06/14/20 0800  Distal Lumen Status Flushed;Saline locked 06/14/20 0800  Dressing Type Transparent;Occlusive 06/14/20 0800  Dressing Status Clean;Dry;Intact 06/14/20 0800  Antimicrobial disc in place? Yes 06/14/20 0800  Line Care Connections checked and tightened 06/14/20 0800  Dressing Intervention Other (Comment) 06/14/20 0800  Dressing Change Due July 08, 2020 06/14/20 0800     External Urinary Catheter (Active)  Collection Container Dedicated Suction Canister 06/14/20 0800  Securement Method Securing device (Describe) 06/14/20 0800  Site Assessment Clean;Intact;Dry 06/14/20 0800  Intervention Equipment Changed 06/11/20 2100  Output (mL) 175 mL 06/14/20 0700    Anti-infectives:  Anti-infectives (From admission, onward)   Start     Dose/Rate Route Frequency Ordered Stop   06/13/20 1000  clindamycin (CLEOCIN) IVPB 300 mg        300 mg 100 mL/hr over 30 Minutes Intravenous  Once 06/13/20 0913 06/13/20 0943   06/13/20 0846  clindamycin (CLEOCIN) 300 MG/50ML IVPB       Note to Pharmacy: Carlynn Spry   : cabinet override      06/13/20 0846 06/13/20 0944     Scheduled Meds: . ARIPiprazole  2 mg Oral QHS  . aspirin EC  81 mg  Oral Daily  . B-complex with vitamin C  1 tablet Oral Daily  . busPIRone  20 mg Oral BID  . carvedilol  3.125 mg Oral BID WC  . Chlorhexidine Gluconate Cloth  6 each Topical Daily  . feeding supplement  237 mL Oral Q24H  . finasteride  5 mg Oral Daily  . gabapentin  100 mg Oral QHS  . heparin  5,000 Units Subcutaneous Q8H  . insulin aspart  0-5 Units Subcutaneous QHS  . insulin aspart  0-9 Units Subcutaneous TID WC  . isosorbide mononitrate  30 mg Oral Daily  . nicotine  21 mg Transdermal Daily  . rosuvastatin  10 mg Oral Daily  . sertraline  100 mg Oral Daily  . tamsulosin  0.4 mg Oral Daily   Continuous Infusions: . sodium chloride 10 mL/hr at 06/14/20 0300  . furosemide 120 mg (06/14/20 1213)  . phenylephrine (NEO-SYNEPHRINE) Adult infusion Stopped (06/13/20 1700)   PRN Meds:.sodium chloride, acetaminophen, albuterol, alum & mag hydroxide-simeth, dextromethorphan-guaiFENesin, hydrALAZINE, morphine injection, nitroGLYCERIN, ondansetron (ZOFRAN) IV, oxyCODONE-acetaminophen, polyvinyl alcohol   Microbiology: Results for orders placed or performed during the hospital encounter of 06/11/20  Resp Panel by RT-PCR (Flu A&B, Covid) Nasopharyngeal Swab     Status: None   Collection Time: 06/11/20  8:30 AM   Specimen: Nasopharyngeal Swab; Nasopharyngeal(NP) swabs in vial transport medium  Result Value Ref Range Status   SARS Coronavirus 2 by RT PCR NEGATIVE NEGATIVE Final    Comment: (NOTE) SARS-CoV-2 target nucleic acids are NOT DETECTED.  The SARS-CoV-2 RNA is generally detectable in upper respiratory  specimens during the acute phase of infection. The lowest concentration of SARS-CoV-2 viral copies this assay can detect is 138 copies/mL. A negative result does not preclude SARS-Cov-2 infection and should not be used as the sole basis for treatment or other patient management decisions. A negative result may occur with  improper specimen collection/handling, submission of specimen  other than nasopharyngeal swab, presence of viral mutation(s) within the areas targeted by this assay, and inadequate number of viral copies(<138 copies/mL). A negative result must be combined with clinical observations, patient history, and epidemiological information. The expected result is Negative.  Fact Sheet for Patients:  EntrepreneurPulse.com.au  Fact Sheet for Healthcare Providers:  IncredibleEmployment.be  This test is no t yet approved or cleared by the [place] FDA and  has been authorized for detection and/or diagnosis of SARS-CoV-2 by FDA under an Emergency Use Authorization (EUA). This EUA will remain  in effect (meaning this test can be used) for the duration of the COVID-19 declaration under Section 564(b)(1) of the Act, 21 U.S.C.section 360bbb-3(b)(1), unless the authorization is terminated  or revoked sooner.       Influenza A by PCR NEGATIVE NEGATIVE Final   Influenza B by PCR NEGATIVE NEGATIVE Final    Comment: (NOTE) The Xpert Xpress SARS-CoV-2/FLU/RSV plus assay is intended as an aid in the diagnosis of influenza from Nasopharyngeal swab specimens and should not be used as a sole basis for treatment. Nasal washings and aspirates are unacceptable for Xpert Xpress SARS-CoV-2/FLU/RSV testing.  Fact Sheet for Patients: EntrepreneurPulse.com.au  Fact Sheet for Healthcare Providers: IncredibleEmployment.be  This test is not yet approved or cleared by the [place] FDA and has been authorized for detection and/or diagnosis of SARS-CoV-2 by FDA under an Emergency Use Authorization (EUA). This EUA will remain in effect (meaning this test can be used) for the duration of the COVID-19 declaration under Section 564(b)(1) of the Act, 21 U.S.C. section 360bbb-3(b)(1), unless the authorization is terminated or revoked.  Performed at [facility], [address]  [place], [place] [address]   MRSA PCR Screening     Status: None   Collection Time: 06/13/20  5:40 AM   Specimen: Nasal Mucosa; Nasopharyngeal  Result Value Ref Range Status   MRSA by PCR NEGATIVE NEGATIVE Final    Comment:        The GeneXpert MRSA Assay (FDA approved for NASAL specimens only), is one component of a comprehensive MRSA colonization surveillance program. It is not intended to diagnose MRSA infection nor to guide or monitor treatment for MRSA infections. Performed at [facility], [address] [place], [place] [address]    *Note: Due to a large number of results and/or encounters for the requested time period, some results have not been displayed. A complete set of results can be found in Results Review.   Results for orders placed or performed during the hospital encounter of 06/11/20 (from the past 24 hour(s))  Glucose, capillary     Status: Abnormal   Collection Time: 06/13/20  4:31 PM  Result Value Ref Range   Glucose-Capillary 146 (H) 70 - 99 mg/dL  Glucose, capillary     Status: Abnormal   Collection Time: 06/13/20  9:15 PM  Result Value Ref Range   Glucose-Capillary 184 (H) 70 - 99 mg/dL  Basic metabolic panel     Status: Abnormal   Collection Time: 06/14/20  4:23 AM  Result Value Ref Range   Sodium 135 135 - 145 mmol/L   Potassium  4.0 3.5 - 5.1 mmol/L   Chloride 96 (L) 98 - 111 mmol/L   CO2 26 22 - 32 mmol/L   Glucose, Bld 142 (H) 70 - 99 mg/dL   BUN 108 (H) 8 - 23 mg/dL   Creatinine, Ser 4.10 (H) 0.61 - 1.24 mg/dL   Calcium 8.3 (L) 8.9 - 10.3 mg/dL   GFR, Estimated 15 (L) >60 mL/min   Anion gap 13 5 - 15  CBC with Differential/Platelet     Status: Abnormal   Collection Time: 06/14/20  4:23 AM  Result Value Ref Range   WBC 16.2 (H) 4.0 - 10.5 K/uL   RBC 2.71 (L) 4.22 - 5.81 MIL/uL   Hemoglobin 7.4 (L) 13.0 - 17.0 g/dL   HCT 24.6 (L) 39 - 52 %   MCV 90.8 80.0 - 100.0 fL   MCH 27.3 26.0 - 34.0 pg   MCHC 30.1 30.0 - 36.0 g/dL   RDW  15.8 (H) 11.5 - 15.5 %   Platelets 193 150 - 400 K/uL   nRBC 0.9 (H) 0.0 - 0.2 %   Neutrophils Relative % 86 %   Neutro Abs 13.9 (H) 1.7 - 7.7 K/uL   Lymphocytes Relative 6 %   Lymphs Abs 1.0 0.7 - 4.0 K/uL   Monocytes Relative 7 %   Monocytes Absolute 1.2 (H) 0.1 - 1.0 K/uL   Eosinophils Relative 0 %   Eosinophils Absolute 0.0 0.0 - 0.5 K/uL   Basophils Relative 0 %   Basophils Absolute 0.0 0.0 - 0.1 K/uL   Immature Granulocytes 1 %   Abs Immature Granulocytes 0.10 (H) 0.00 - 0.07 K/uL  Magnesium     Status: None   Collection Time: 06/14/20  4:23 AM  Result Value Ref Range   Magnesium 2.4 1.7 - 2.4 mg/dL  Phosphorus     Status: Abnormal   Collection Time: 06/14/20  4:23 AM  Result Value Ref Range   Phosphorus 7.0 (H) 2.5 - 4.6 mg/dL  Glucose, capillary     Status: Abnormal   Collection Time: 06/14/20  7:32 AM  Result Value Ref Range   Glucose-Capillary 126 (H) 70 - 99 mg/dL  Glucose, capillary     Status: Abnormal   Collection Time: 06/14/20 12:11 PM  Result Value Ref Range   Glucose-Capillary 136 (H) 70 - 99 mg/dL   *Note: Due to a large number of results and/or encounters for the requested time period, some results have not been displayed. A complete set of results can be found in Results Review.     Best Practice/Protocols:  VTE Prophylaxis: Heparin (SQ)   Events: 06/13/2020: Admitted to stepdown from PACU due to transient hypotension postop.  On Neo-Synephrine, weaned off. 06/14/2020: Remains off of pressors.  Mildly encephalopathic due to uremia.  Studies: [place] Dialysis Access  Result Date: 06/12/2020 CLINICAL DATA:  64 year old with left upper extremity ischemia and left brachiocephalic AV fistula. Recent balloon angioplasty of left ulnar artery and left brachial artery. Stent placement in left brachial artery. EXAM: VASCULAR ULTRASOUND LEFT UPPER EXTREMITY AV FISTULA TECHNIQUE: Vascular imaging was obtained of the left upper extremity AV fistula. COMPARISON:   None. FINDINGS: Limited imaging was obtained of the left upper extremity. There is an arteriovenous anastomosis that is patent. Vessels appear to be incorrectly labeled on this examination and limited evaluation. IMPRESSION: Limited evaluation of the left upper extremity AV fistula. The AV anastomosis appears to be patent. Electronically Signed   By: Markus Daft M.D.   On: 06/12/2020  11:52   PERIPHERAL VASCULAR CATHETERIZATION  Result Date: 06/13/2020 See op note  PERIPHERAL VASCULAR CATHETERIZATION  Result Date: 06/06/2020 See op note  US Venous Img Lower Bilateral  Result Date: 06/11/2020 CLINICAL DATA:  Bilateral lower extremity pain and swelling. EXAM: BILATERAL LOWER EXTREMITY VENOUS DOPPLER ULTRASOUND TECHNIQUE: Gray-scale sonography with compression, as well as color and duplex ultrasound, were performed to evaluate the deep venous system(s) from the level of the common femoral vein through the popliteal and proximal calf veins. COMPARISON:  None. FINDINGS: BILATERAL LOWER EXTREMITY VENOUS: Normal compressibility of the common femoral, superficial femoral, and popliteal veins, as well as the visualized calf veins. Visualized portions of profunda femoral vein and great saphenous vein unremarkable. No filling defects to suggest DVT on grayscale or color Doppler imaging. Doppler waveforms show normal direction of venous flow, normal respiratory plasticity and response to augmentation. OTHER None. Limitations: Patient movement and mild subcutaneous edema. IMPRESSION: Negative for DVT bilateral lower extremities. Electronically Signed   By: Marin Olp M.D.   On: 06/11/2020 09:29   DG Chest Port 1 View  Result Date: 06/13/2020 CLINICAL DATA:  PICC line placement EXAM: PORTABLE CHEST 1 VIEW COMPARISON:  05/20/2020 FINDINGS: Right neck PICC line tip in the SVC 3 cm above the right atrium. No pneumothorax. Mild fluid overload/congestive heart failure. Poor inspiration with some volume loss at  both lung bases. Previous median sternotomy and CABG. IMPRESSION: 1. PICC line well positioned with tip in the SVC 3 cm above the right atrium. 2. Mild fluid overload/congestive heart failure. Electronically Signed   By: Nelson Chimes M.D.   On: 06/13/2020 11:46   DG Chest Portable 1 View  Result Date: 05/20/2020 CLINICAL DATA:  Shortness of breath EXAM: PORTABLE CHEST 1 VIEW COMPARISON:  04/09/2020 FINDINGS: Post CABG changes. Stable cardiomediastinal contours. Pulmonary vascular congestion mild diffuse interstitial opacities throughout both lungs. Suspect trace bilateral pleural effusions. No pneumothorax. IMPRESSION: Findings suggestive of CHF with mild pulmonary edema. These results will be called to the ordering clinician or representative by the Radiologist Assistant, and communication documented in the PACS or [employer]. Electronically Signed   By: Davina Poke D.O.   On: 05/20/2020 08:19   [place] OR NERVE BLOCK-IMAGE ONLY [facility])  Result Date: 05/24/2020 There is no interpretation for this exam.  This order is for images obtained during a surgical procedure.  Please See "Surgeries" Tab for more information regarding the procedure.   [place] OR NERVE BLOCK-IMAGE ONLY [facility])  Result Date: 05/20/2020 There is no interpretation for this exam.  This order is for images obtained during a surgical procedure.  Please See "Surgeries" Tab for more information regarding the procedure.    Consults: Treatment Team:  Algernon Huxley, MD [facility], [facility], MD   Subjective:    Overnight Issues: No pressor requirements overnight.  Remains hemodynamically stable.  Does have oxygen desaturations during sleep if oxygen is off.  Objective:  Vital signs for last 24 hours: Temp:  [97 F (36.1 C)-98.9 F (37.2 C)] 98.9 F (37.2 C) (11/23 0800) Pulse Rate:  [79-88] 83 (11/23 0900) Resp:  [12-23] 19 (11/23 0900) BP: (93-165)/(46-97) 132/77 (11/23 0800) SpO2:  [87 %-100 %] 91 % (11/23 0900) Weight:   [105.3 kg-106.9 kg] 105.3 kg (11/23 0500)  Hemodynamic parameters for last 24 hours:    Intake/Output from previous day: 11/22 0701 - 11/23 0700 In: 1805.6 [P.O.:720; I.V.:727; IV Piggyback:358.6] Out: 1125 [Urine:1125]  Intake/Output this shift: No intake/output data recorded.  Vent settings for last  24 hours:  N/A  Physical Exam:  GENERAL: Chronically ill-appearing man, no acute respiratory distress.  Appears confused at times. HEAD: Normocephalic, atraumatic.  EYES: Pupils equal, round, reactive to light.  No scleral icterus.  MOUTH: Oral mucosa moist. NECK: Supple. No thyromegaly. Trachea midline. No JVD.  No adenopathy. PULMONARY: Good air entry bilaterally.  No adventitious sounds. CARDIOVASCULAR: S1 and S2. Regular rate and rhythm.  Grade 3/6 to 4/6 aortic stenosis murmur. ABDOMEN: Obese, soft, no grimacing on palpation, normoactive bowel sounds. MUSCULOSKELETAL: No joint deformity, no clubbing, no edema.  Diminished pulses on the left upper extremity NEUROLOGIC: Somewhat befuddled, no overt focal deficit. SKIN: Cool toes distally, no overt rashes. PSYCH: Somewhat befuddled, mildly encephalopathic.  Assessment/Plan:  Postoperative hypotension Likely due to lingering effects of anesthesia in the setting of renal failure Effect pronounced due to severe aortic stenosis Now totally resolved and off Neo-Synephrine From our standpoint can be moved out of stepdown unit  Severe aortic stenosis This issue adds complexity to his management Ideally should have surgery Will need dialysis to be able to undergo TAVR Palliative care consult for goals of care determination Patient poor candidate for surgical intervention at present  Chronic kidney disease stage V Mild confusion may be due to uremia Per renal plan for temporary access and dialysis on this admission Patient has been reluctant to undergo dialysis but now reconsidering  OSA He is on CPAP at home Patient to use  his CPAP at nighttime    LOS: 3 days   Additional comments: Discussed during multidisciplinary rounds with SDU/ICU staff.  Critical Care Total Time*: Level 2 follow-up  PCCM will sign off please reconsult as needed.  Renold Don, MD [place] P[facility] 06/14/2020  *Care during the described time interval was provided by me and/or other providers on the critical care team.  I have reviewed this patient's available data, including medical history, events of note, physical examination and test results as part of my evaluation.

## 2020-06-14 NOTE — Consult Note (Addendum)
Consultation Note Date: 06/14/2020   Patient Name: [name] [name]  DOB: 10-Nov-1955  MRN: [id number]  Age / Sex: 64 y.o., male  PCP: [name], MD Referring Physician: Sharen Hones, MD  Reason for Consultation: Establishing goals of care  HPI/Patient Profile: The patient is a very complex 64 year old male with chronic kidney disease stage V who presented as outpatient today for a surgery on his left AV fistula due to left hand pain from ischemia due to a steal syndrome.  He underwent embolization of the left arm AV fistula.  Clinical Assessment and Goals of Care: Patient is resting in bed. He states he has been widowed for almost 2 years now.  He states he has two children and lives with one of his children, his son.   Patient states that functionally he is independent with a cane.  He uses home oxygen.   Began to discuss goals of care, and noted that every question received a yes response.  Asked opposing questions about the same thing, and got yes responses to them all.  Patient is unable to participate in a goals of care conversation.  Spoke with emergency contact who is second HPOA. She states to speak with her husband who is primary HPOA and patient's step-son.  Spoke with [name] [name]. He states the patient has no biological children, that Regina was married to his mother from childhood, and considers he and his wife his children. HPOA papers are scanned into [facility] for he and his wife to be decision makers if he is unable to make decisions for himself. [name] [name] states that his mother died from cancer and was at home with hospice before she died, and he and his wife cared for her.  He states that the patient does live with him and stays in the bed, and mostly to himself.  Discussed that patient has been answering yes to all the questions that I asked of him. He states it is difficult to have  conversations with him.  He states [name] was taken out of school in the second or third grade by his parents, but he is unsure why.    He states because of his arms, he is unable to do a lot for himself.  He states that they spend a lot of time taking him to doctor's offices for his various health issues. [name] [name] tells me the patient will do what ever the doctors request of him, but after a couple of days he goes back to doing things his own way, including eating as he wishes without restriction.  [name] [name] states that his stepfather now walks around the house in just a T-shirt or his underwear because he has so much neuropathic pain he is unable to put clothes on.  He states [name] got his grandson to drive him to the store for lottery tickets and he bought a carton of cigarettes which he hid to smoke.  [name] [name] states he had not smoked previously in quite a while.  We discussed his  diagnoses, prognosis, GOC, EOL wishes disposition and options.  Created space and opportunity for patient  to explore thoughts and feelings regarding current medical information.   He states it has been challenging to have [name] lives with them though they love him very much.  A detailed discussion was had today regarding advanced directives.  Concepts specific to code status, artifical feeding and hydration, IV antibiotics and rehospitalization were discussed.  The difference between an aggressive medical intervention path and a comfort care path was discussed.  Values and goals of care important to patient and family were attempted to be elicited.  Discussed limitations of medical interventions to prolong quality of life in some situations and discussed the concept of human mortality.  He states that perhaps a shift to comfort might be the best idea for [name].  Plans for a meeting in the morning at 9 AM for further.  MOST form completed for DNR/DNI.  I completed a MOST form today with HPOA through [facility] and the  signed original was placed in the chart. A photocopy was placed in the chart to be scanned into EMR. The patient outlined their wishes for the following treatment decisions:  Cardiopulmonary Resuscitation: Attempt Resuscitation (CPR)  Medical Interventions: Limited Additional Interventions: Use medical treatment, IV fluids and cardiac monitoring as indicated, DO NOT USE intubation or mechanical ventilation. May consider use of less invasive airway support such as BiPAP or CPAP. Also provide comfort measures. Transfer to the hospital if indicated. Avoid intensive care.   Antibiotics: Antibiotics if indicated  IV Fluids: IV fluids if indicated  Feeding Tube: No feeding tube      SUMMARY OF RECOMMENDATIONS   Plans for meeting in the morning. Will most likely transition to comfort care and home with hospice.     Prognosis:   < 2 weeks with shift to comfort care      Primary Diagnoses: Present on Admission: . PVD (peripheral vascular disease) ([place]) . Left arm pain-due to ischemia secondary to steal syndrome . Hyperlipidemia, mixed . Essential hypertension . Type II diabetes mellitus with renal manifestations ([place]) . Chronic heart failure with preserved ejection fraction (HFpEF) ([place]) . CAD (coronary artery disease) . CKD (chronic kidney disease), stage IV ([place]) . Depression . Anemia in chronic kidney disease . COPD (chronic obstructive pulmonary disease) ([place]) . Stroke ([place]) . Hypokalemia . Leukocytosis . Combined congestive systolic and diastolic heart failure ([place]) . BPH (benign prostatic hyperplasia)   I have reviewed the medical record, interviewed the patient and family, and examined the patient. The following aspects are pertinent.  Past Medical History:  Diagnosis Date  . 3-vessel CAD    8/30 LHC with radial graft to RCA occluded at the ostium with critical stenosis of the ostial native RCA.  . Adenomatous colon polyp   . Allergy   . Anemia   . Anxiety   .  Arthritis    RA  . Asthma   . Bell's palsy 2007   neuropathy  . CAD (coronary artery disease)    03/21/2020 LHC with patent LIMA to LAD and SVG to OM 3.  Radial graft to RCA occluded at the ostium with critical stenosis of the ostial and native RCA.  . Carotid artery occlusion    Bilateral carotid stenosis s/p left-sided CEA.  . Cataract    Dr. Dawna Part  . CKD (chronic kidney disease), stage IV ([place])    03/2020 vascular surgery access planned  . Depression   . Diabetes mellitus   .  Diverticulosis   . Duodenitis   . DVT (deep venous thrombosis) ([facility])    in leg  . Dyspnea    with edema  . ESRD (end stage renal disease) ([place])   . Gastropathy   . GERD (gastroesophageal reflux disease)   . Heart failure with preserved ejection fraction ([place])    8/30 RHC with moderately to severely elevated filling pressures and pulmonary wedge pressure 31 mmHg, moderate pulmonary hypertension  . Heart murmur   . Helicobacter pylori gastritis   . Hx of CABG    2012  . Hyperlipidemia   . Hypertension   . Mitral regurgitation and aortic stenosis    RHC 8/30 and echo 12-Feb-2020.  [name] Myocardial infarction ([place]) 2021  . Neuropathy   . Obesity   . Peripheral vascular disease ([place])    S/p extensive revascularization by vascular surgery 05/2019  . Post splenectomy syndrome   . Psoriasis   . Severe aortic stenosis    03/21/20 RHC with peak gradient 27.4 mmHg and valve area 0.87.  [name] Sleep apnea    uses cpap  . Stroke ([place])   . Tobacco use disorder    recently quit  . Tubular adenoma 08/2013   Dr. Hilarie Fredrickson  . Weak urinary stream    Social History   Socioeconomic History  . Marital status: Widowed    Spouse name: Not on file  . Number of children: 2  . Years of education: Not on file  . Highest education level: Not on file  Occupational History  . Occupation: Chief Operating Officer at [employer]: Disabled mostly due to neuropathy  Tobacco Use  . Smoking status: Former Smoker    Packs/day: 0.25     Years: 40.00    Pack years: 10.00    Types: Cigarettes  . Smokeless tobacco: Never Used  . Tobacco comment: 1 cigarette a day  Vaping Use  . Vaping Use: Never used  Substance and Sexual Activity  . Alcohol use: No  . Drug use: No  . Sexual activity: Never  Other Topics Concern  . Not on file  Social History Narrative   Wife died 02-12-19      Has living will   [name] [name] and his wife Levada Dy should be his health care POA   Would accept resuscitation --but no prolonged ventilation   No tube feeds if cognitively unaware   Social Determinants of Health   Financial Resource Strain: Low Risk   . Difficulty of Paying Living Expenses: Not very hard  Food Insecurity: No Food Insecurity  . Worried About Charity fundraiser in the Last Year: Never true  . Ran Out of Food in the Last Year: Never true  Transportation Needs: No Transportation Needs  . Lack of Transportation (Medical): No  . Lack of Transportation (Non-Medical): No  Physical Activity:   . Days of Exercise per Week: Not on file  . Minutes of Exercise per Session: Not on file  Stress:   . Feeling of Stress : Not on file  Social Connections:   . Frequency of Communication with Friends and Family: Not on file  . Frequency of Social Gatherings with Friends and Family: Not on file  . Attends Religious Services: Not on file  . Active Member of Clubs or Organizations: Not on file  . Attends Archivist Meetings: Not on file  . Marital Status: Not on file   Family History  Problem Relation Age of Onset  .  Lung cancer Father 22  . Arthritis Father   . Breast cancer Sister 80  . Alcoholism Sister   . Dementia Mother   . Alzheimer's disease Mother   . Arthritis Mother   . Alcoholism Brother   . Epilepsy Sister   . Diabetes Paternal Grandfather   . Colon polyps Paternal Grandfather 87  . Alcoholism Sister   . Alcoholism Sister   . Alcoholism Brother    Scheduled Meds: . ARIPiprazole  2 mg Oral QHS  .  aspirin EC  81 mg Oral Daily  . B-complex with vitamin C  1 tablet Oral Daily  . busPIRone  20 mg Oral BID  . carvedilol  3.125 mg Oral BID WC  . Chlorhexidine Gluconate Cloth  6 each Topical Daily  . feeding supplement  237 mL Oral Q24H  . finasteride  5 mg Oral Daily  . gabapentin  100 mg Oral QHS  . heparin  5,000 Units Subcutaneous Q8H  . insulin aspart  0-5 Units Subcutaneous QHS  . insulin aspart  0-9 Units Subcutaneous TID WC  . isosorbide mononitrate  30 mg Oral Daily  . nicotine  21 mg Transdermal Daily  . rosuvastatin  10 mg Oral Daily  . sertraline  100 mg Oral Daily  . tamsulosin  0.4 mg Oral Daily   Continuous Infusions: . sodium chloride 10 mL/hr at 06/14/20 0300  . furosemide 120 mg (06/14/20 1213)  . phenylephrine (NEO-SYNEPHRINE) Adult infusion Stopped (06/13/20 1700)   PRN Meds:.sodium chloride, acetaminophen, albuterol, alum & mag hydroxide-simeth, dextromethorphan-guaiFENesin, hydrALAZINE, morphine injection, nitroGLYCERIN, ondansetron (ZOFRAN) IV, oxyCODONE-acetaminophen, polyvinyl alcohol Medications Prior to Admission:  Prior to Admission medications   Medication Sig Start Date End Date Taking? Authorizing Provider  ARIPiprazole (ABILIFY) 2 MG tablet Take 1 tablet (2 mg total) by mouth at bedtime. 06/01/20  Yes Viviana Simpler I, MD  aspirin EC 81 MG tablet Take 1 tablet (81 mg total) by mouth daily. 06/06/20  Yes Dew, Erskine Squibb, MD  B Complex-C (B-COMPLEX WITH VITAMIN C) tablet Take 1 tablet by mouth daily. 05/27/20  Yes Nicole Kindred A, DO  busPIRone (BUSPAR) 10 MG tablet TAKE 2 TABLETS(20 MG) BY MOUTH TWICE DAILY Patient taking differently: Take 20 mg by mouth 2 (two) times daily.  02/19/20  Yes [name], MD  calcitRIOL (ROCALTROL) 0.25 MCG capsule Take 0.25 mcg by mouth every Monday, Wednesday, and Friday.    Yes [provider]  carvedilol (COREG) 3.125 MG tablet Take 1 tablet (3.125 mg total) by mouth 2 (two) times daily with a meal.  05/26/20  Yes Nicole Kindred A, DO  clopidogrel (PLAVIX) 75 MG tablet Take 1 tablet (75 mg total) by mouth daily. 03/25/20  Yes Sande Rives E, PA-C  finasteride (PROSCAR) 5 MG tablet Take 5 mg by mouth daily.   Yes [provider]  insulin aspart (NOVOLOG) 100 UNIT/ML injection Inject 0-9 Units into the skin 3 (three) times daily with meals. 03/21/20  Yes Nicole Kindred A, DO  insulin regular (NOVOLIN R) 100 units/mL injection Inject 0.1-0.25 mLs (10-25 Units total) into the skin daily as needed (high blood sugar over 235). 05/19/20  Yes [name], MD  isosorbide mononitrate (IMDUR) 30 MG 24 hr tablet Take 1 tablet (30 mg total) by mouth daily. 05/19/20  Yes Wellington Hampshire, MD  rosuvastatin (CRESTOR) 10 MG tablet Take 1 tablet (10 mg total) by mouth daily. 04/25/20  Yes Wellington Hampshire, MD  sertraline (ZOLOFT) 100 MG tablet Take  1 tablet (100 mg total) by mouth daily. 02/23/20  Yes [name], MD  tamsulosin (FLOMAX) 0.4 MG CAPS capsule Take 1 capsule by mouth daily Patient taking differently: Take 0.4 mg by mouth daily.  01/20/20  Yes [name], MD  torsemide (DEMADEX) 20 MG tablet Take 3 tablets (60 mg total) by mouth 2 (two) times daily. 05/26/20  Yes Nicole Kindred A, DO  acetaminophen (TYLENOL) 500 MG tablet Take 500 mg by mouth every 6 (six) hours as needed for moderate pain or headache.    [provider]  albuterol (PROAIR HFA) 108 (90 Base) MCG/ACT inhaler Inhale 2 puffs into the lungs every 6 (six) hours as needed for shortness of breath. 06/01/20   [name], MD  alum & mag hydroxide-simeth (MAALOX/MYLANTA) 200-200-20 MG/5ML suspension Take 30 mLs by mouth every 6 (six) hours as needed for indigestion or heartburn.    [provider]  CONTOUR TEST test strip USE TO check blood sugar ONCE DAILY AS DIRECTED 05/10/20   Philemon Kingdom, MD  Dextran 70-Hypromellose (ARTIFICIAL TEARS) 0.1-0.3 % SOLN Place 1 drop into both eyes 4  (four) times daily as needed (dry eyes).    [provider]  feeding supplement (ENSURE ENLIVE / ENSURE PLUS) LIQD Take 237 mLs by mouth daily. 05/27/20   Ezekiel Slocumb, DO  Insulin Syringe-Needle U-100 30G X 15/64" 1 ML MISC Use to inject insulin as directed 05/19/20   [name], MD  nitroGLYCERIN (NITROSTAT) 0.4 MG SL tablet DISSOLVE 1 TABLET UNDER THE TONGUE EVERY 5 MINUTES AS NEEDED FOR CHEST PAIN. DO NOT EXCEED A TOTAL OF 3 DOSES IN 15 MINUTES. Patient taking differently: Place 0.4 mg under the tongue every 5 (five) minutes as needed for chest pain.  04/18/20   [name], PA-C   Allergies  Allergen Reactions  . Contrast Media [Iodinated Diagnostic Agents] Rash and Other (See Comments)    Got very hot and red   . Glipizide Other (See Comments)    ANTIDIABETICS. Burning  . Hydroxychloroquine Other (See Comments)    Stomach upset  . Metrizamide Other (See Comments)    Got very hot and red  . Penicillins Hives and Swelling    Has patient had a PCN reaction causing immediate rash, facial/tongue/throat swelling, SOB or lightheadedness with hypotension: yes Has patient had a PCN reaction causing severe rash involving mucus membranes or skin necrosis: no  Has patient had a PCN reaction that required hospitalization: yes Has patient had a PCN reaction occurring within the last 10 years: no If all of the above answers are "NO", then may proceed with Cephalosporin use.    Review of Systems  All other systems reviewed and are negative.   Physical Exam Pulmonary:     Effort: Pulmonary effort is normal.  Skin:    General: Skin is warm and dry.  Neurological:     Mental Status: He is alert.     Vital Signs: BP 128/82   Pulse 83   Temp 98.9 F (37.2 C) (Axillary)   Resp (!) 21   Ht 5\' 6"  (1.676 m)   Wt 105.3 kg   SpO2 94%   BMI 37.47 kg/m  Pain Scale: CPOT POSS *See Group Information*: 1-Acceptable,Awake and alert Pain Score: Asleep   SpO2: SpO2: 94  % O2 Device:SpO2: 94 % O2 Flow Rate: .O2 Flow Rate (L/min): 2 L/min  IO: Intake/output summary:   Intake/Output Summary (Last 24 hours) at 06/14/2020 1243 Last  data filed at 06/14/2020 1213 Gross per 24 hour  Intake 1805.58 ml  Output 1550 ml  Net 255.58 ml    LBM: Last BM Date:  (PTA) Baseline Weight: Weight: 102 kg Most recent weight: Weight: 105.3 kg     Palliative Assessment/Data:     Time In: 1:20 Time Out: 2:30 Time Total: 70 min Greater than 50%  of this time was spent counseling and coordinating care related to the above assessment and plan.  Signed by: Asencion Gowda, NP   Please contact Palliative Medicine Team phone at [phone] for questions and concerns.  For individual provider: See Shea Evans

## 2020-06-15 DIAGNOSIS — M79602 Pain in left arm: Secondary | ICD-10-CM | POA: Diagnosis not present

## 2020-06-15 DIAGNOSIS — N184 Chronic kidney disease, stage 4 (severe): Secondary | ICD-10-CM | POA: Diagnosis not present

## 2020-06-15 DIAGNOSIS — I5032 Chronic diastolic (congestive) heart failure: Secondary | ICD-10-CM | POA: Diagnosis not present

## 2020-06-15 DIAGNOSIS — D631 Anemia in chronic kidney disease: Secondary | ICD-10-CM | POA: Diagnosis not present

## 2020-06-15 LAB — GLUCOSE, CAPILLARY
Glucose-Capillary: 153 mg/dL — ABNORMAL HIGH (ref 70–99)
Glucose-Capillary: 144 mg/dL — ABNORMAL HIGH (ref 70–99)
Glucose-Capillary: 157 mg/dL — ABNORMAL HIGH (ref 70–99)

## 2020-06-15 LAB — CBC WITH DIFFERENTIAL/PLATELET
Abs Immature Granulocytes: 0.15 10*3/uL — ABNORMAL HIGH (ref 0.00–0.07)
Basophils Absolute: 0 10*3/uL (ref 0.0–0.1)
Basophils Relative: 0 %
Eosinophils Absolute: 0.1 10*3/uL (ref 0.0–0.5)
Eosinophils Relative: 1 %
HCT: 25.6 % — ABNORMAL LOW (ref 39.0–52.0)
Hemoglobin: 7.8 g/dL — ABNORMAL LOW (ref 13.0–17.0)
Immature Granulocytes: 1 %
Lymphocytes Relative: 8 %
Lymphs Abs: 1.1 10*3/uL (ref 0.7–4.0)
MCH: 28 pg (ref 26.0–34.0)
MCHC: 30.5 g/dL (ref 30.0–36.0)
MCV: 91.8 fL (ref 80.0–100.0)
Monocytes Absolute: 1 10*3/uL (ref 0.1–1.0)
Monocytes Relative: 7 %
Neutro Abs: 11.6 10*3/uL — ABNORMAL HIGH (ref 1.7–7.7)
Neutrophils Relative %: 83 %
Platelets: 194 10*3/uL (ref 150–400)
RBC: 2.79 MIL/uL — ABNORMAL LOW (ref 4.22–5.81)
RDW: 15.9 % — ABNORMAL HIGH (ref 11.5–15.5)
WBC: 13.9 10*3/uL — ABNORMAL HIGH (ref 4.0–10.5)
nRBC: 0.4 % — ABNORMAL HIGH (ref 0.0–0.2)

## 2020-06-15 LAB — PHOSPHORUS: Phosphorus: 6.3 mg/dL — ABNORMAL HIGH (ref 2.5–4.6)

## 2020-06-15 LAB — BASIC METABOLIC PANEL
Anion gap: 15 (ref 5–15)
BUN: 114 mg/dL — ABNORMAL HIGH (ref 8–23)
CO2: 24 mmol/L (ref 22–32)
Calcium: 8.4 mg/dL — ABNORMAL LOW (ref 8.9–10.3)
Chloride: 98 mmol/L (ref 98–111)
Creatinine, Ser: 3.8 mg/dL — ABNORMAL HIGH (ref 0.61–1.24)
GFR, Estimated: 17 mL/min — ABNORMAL LOW (ref >60)
Glucose, Bld: 172 mg/dL — ABNORMAL HIGH (ref 70–99)
Potassium: 3.6 mmol/L (ref 3.5–5.1)
Sodium: 137 mmol/L (ref 135–145)

## 2020-06-15 LAB — MAGNESIUM: Magnesium: 2.3 mg/dL (ref 1.7–2.4)

## 2020-06-15 MED ORDER — MORPHINE SULFATE (PF) 2 MG/ML IV SOLN: 2.0000 mg | INTRAVENOUS | Status: DC | PRN

## 2020-06-15 MED ORDER — LORAZEPAM 2 MG/ML PO CONC: 1.0000 mg | ORAL | Status: DC | PRN

## 2020-06-15 MED ORDER — GABAPENTIN 100 MG PO CAPS: 100.0000 mg | ORAL_CAPSULE | Freq: Two times a day (BID) | ORAL | Status: DC

## 2020-06-15 MED ORDER — HALOPERIDOL LACTATE 5 MG/ML IJ SOLN: 0.5000 mg | INTRAMUSCULAR | Status: DC | PRN

## 2020-06-15 MED ORDER — LORAZEPAM 1 MG PO TABS: 1.0000 mg | ORAL_TABLET | ORAL | Status: DC | PRN

## 2020-06-15 MED ORDER — HALOPERIDOL 0.5 MG PO TABS
0.5000 mg | ORAL_TABLET | ORAL | Status: DC | PRN
  Filled 2020-06-15: qty 1

## 2020-06-15 MED ORDER — GLYCOPYRROLATE 0.2 MG/ML IJ SOLN
0.2000 mg | INTRAMUSCULAR | Status: DC | PRN
  Filled 2020-06-15: qty 1

## 2020-06-15 MED ORDER — OXYCODONE HCL 5 MG PO TABS: 5.0000 mg | ORAL_TABLET | ORAL | Status: DC | PRN

## 2020-06-15 MED ORDER — MORPHINE SULFATE (PF) 2 MG/ML IV SOLN
2.0000 mg | INTRAVENOUS | Status: DC | PRN
Start: 2020-06-15 — End: 2020-06-15

## 2020-06-15 MED ORDER — LORAZEPAM 2 MG/ML IJ SOLN: 0.5000 mg | INTRAMUSCULAR | Status: DC | PRN

## 2020-06-15 MED ORDER — HALOPERIDOL LACTATE 2 MG/ML PO CONC
0.5000 mg | ORAL | Status: DC | PRN
  Filled 2020-06-15: qty 0.3

## 2020-06-15 MED ORDER — POLYVINYL ALCOHOL 1.4 % OP SOLN
1.0000 [drp] | Freq: Four times a day (QID) | OPHTHALMIC | Status: DC | PRN
  Filled 2020-06-15: qty 15

## 2020-06-15 MED ORDER — BIOTENE DRY MOUTH MT LIQD: 15.0000 mL | OROMUCOSAL | Status: DC | PRN

## 2020-06-15 MED ORDER — GLYCOPYRROLATE 1 MG PO TABS
1.0000 mg | ORAL_TABLET | ORAL | Status: DC | PRN
  Filled 2020-06-15: qty 1

## 2020-06-15 MED ORDER — OXYCODONE HCL 5 MG PO TABS
5.0000 mg | ORAL_TABLET | ORAL | 0 refills | Status: AC | PRN
Start: 2020-06-15 — End: 2020-06-18

## 2020-06-15 NOTE — Progress Notes (Addendum)
Daily Progress Note   Patient Name: [name] [name]       Date: 06/15/2020 DOB: 1956/03/05  Age: 64 y.o. MRN#: [id number] Attending Physician: Wyvonnia Dusky, MD Primary Care Physician: [name], MD Admit Date: 06/11/2020  Reason for Consultation/Follow-up: Establishing goals of care  Subjective: Patient is resting in bed with step son and his wife at bedside. They are his HPOA's. Mr. Reisig is able to tell me his name, city,  that he is at the hospital, the year of 2021, that Barbette Or is president, and that he is here for his fistula.   Family present for [place] conversation. Discussed a continued aggressive path with dialysis access placement, dialysis, and then potential candidacy for TAVR. We discussed a comfort path. We discussed carious scenarios and discussed prognosis with comfort path, and overall prognosis with aggressive care.   He first is agreeable to anything offered comfort or agressive. Many mixed statements made blending true comfort care with aggressive care. Spent a great amount of time asking many questions in various ways to try to discern his wishes.  Step son discusses patient's lifestyle at home, spending most of his time in his room and not following healthcare recommendations. He discusses Mr. Bhardwaj pain, and many of his medical diagnoses. He discusses his mother, the patient's wife's suffering while doing chemo before she died. Patient states he wishes he had not encouraged her to do chemo, and wishes she would have just been made comfortable. Joslyn Hy was very clear that he would honor whatever the patient decides.   Discussed QOL. Patient states he does not feel he is living, but feels he is existing. He states the last few months have been suffering. He was  asked what brings him pleasure, and he states nothing truly brings him pleasure. He states that many times he tells healthcare providers what he thinks they want to hear "to get them off my back".  He was asked what he feels will come from dialysis, and he states "more suffering". He was asked why he wants to do dialysis and he states "to get people off my back". When asked how he wishes to proceed, he states he wants to spend more time with his grandchild. Upon further discussion, he states he wants to spend what time he has left with his  grandchild. Offered to have nephrology in on conversation to help with clarity. Nephrology entered room. Upon speaking with him, patient was then very clear he does not want to take any more medication. He is clear he would like to focus on comfort.   Stepson asked Mr. Mcnair if he wants to continue life prolonging care long enough for patient's brother to be able to visit, and patient was clear he wants to shift to full comfort care at this time, and wants to stop all of his medications that do not contribute to comfort including stopping insulin. He was asked later an additional time and he stated he wants to stop all of his medications that will not help with comfort  including insulin. He understands his prognosis is < 2 weeks.   I completed a MOST form today with patient while step son and his wife were at bedside, and the signed original was placed in the chart. A photocopy was placed in the chart to be scanned into EMR. The patient outlined their wishes for the following treatment decisions:  Cardiopulmonary Resuscitation: Do Not Attempt Resuscitation (DNR/No CPR)  Medical Interventions: Comfort Measures: Keep clean, warm, and dry. Use medication by any route, positioning, wound care, and other measures to relieve pain and suffering. Use oxygen, suction and manual treatment of airway obstruction as needed for comfort. Do not transfer to the hospital unless comfort  needs cannot be met in current location.  Antibiotics: No antibiotics (use other measures to relieve symptoms)  IV Fluids: No IV fluids (provide other measures to ensure comfort)  Feeding Tube: No feeding tube       Length of Stay: 4  Current Medications: Scheduled Meds:   ARIPiprazole  2 mg Oral QHS   aspirin EC  81 mg Oral Daily   B-complex with vitamin C  1 tablet Oral Daily   busPIRone  20 mg Oral BID   carvedilol  3.125 mg Oral BID WC   Chlorhexidine Gluconate Cloth  6 each Topical Daily   feeding supplement  237 mL Oral Q24H   finasteride  5 mg Oral Daily   gabapentin  100 mg Oral QHS   heparin  5,000 Units Subcutaneous Q8H   insulin aspart  0-5 Units Subcutaneous QHS   insulin aspart  0-9 Units Subcutaneous TID WC   isosorbide mononitrate  30 mg Oral Daily   nicotine  21 mg Transdermal Daily   rosuvastatin  10 mg Oral Daily   sertraline  100 mg Oral Daily   tamsulosin  0.4 mg Oral Daily    Continuous Infusions:  sodium chloride 10 mL/hr at 06/14/20 0300   furosemide 120 mg (06/14/20 1213)    PRN Meds: sodium chloride, acetaminophen, albuterol, alum & mag hydroxide-simeth, dextromethorphan-guaiFENesin, hydrALAZINE, morphine injection, nitroGLYCERIN, ondansetron (ZOFRAN) IV, oxyCODONE-acetaminophen, polyvinyl alcohol  Physical Exam Pulmonary:     Effort: Pulmonary effort is normal.  Neurological:     Mental Status: He is alert.             Vital Signs: BP 135/76    Pulse 80    Temp 97.6 F (36.4 C)    Resp 20    Ht $R'5\' 6"'XS$  (1.676 m)    Wt 105.3 kg    SpO2 95%    BMI 37.47 kg/m  SpO2: SpO2: 95 % O2 Device: O2 Device: Room Air O2 Flow Rate: O2 Flow Rate (L/min): 3 L/min  Intake/output summary:   Intake/Output Summary (Last 24 hours) at 06/15/2020 1104 Last  data filed at 06/14/2020 1500 Gross per 24 hour  Intake --  Output 625 ml  Net -625 ml   LBM: Last BM Date:  (PTA) Baseline Weight: Weight: 102 kg Most recent weight: Weight: 105.3  kg       Palliative Assessment/Data: 30%      Patient Active Problem List   Diagnosis Date Noted   Hypotension 06/13/2020   COPD (chronic obstructive pulmonary disease) ([place]) 06/11/2020   Stroke ([place]) 06/11/2020   Hypokalemia 06/11/2020   Leukocytosis 06/11/2020   Bilateral leg pain 06/11/2020   Combined congestive systolic and diastolic heart failure ([place]) 06/11/2020   Steal syndrome as complication of dialysis access [facility])    Acute on chronic combined systolic (congestive) and diastolic (congestive) heart failure ([place]) 05/20/2020   Demand ischemia ([place])    Acute blood loss anemia 04/09/2020   Chronic combined systolic and diastolic CHF (congestive heart failure) ([place]) 04/09/2020   CKD (chronic kidney disease), stage IV ([place]) 04/09/2020   Depression 04/09/2020   Aortic stenosis 04/09/2020   Elevated troponin I level 04/09/2020   Anemia in chronic kidney disease 04/07/2020   Ischemic cardiomyopathy 03/25/2020   Non-ST elevation (NSTEMI) myocardial infarction ([place]) 03/23/2020   CAD (coronary artery disease) 03/22/2020   Chronic heart failure with preserved ejection fraction (HFpEF) ([place]) 03/21/2020   Hx of CABG 03/21/2020   Anemia 03/21/2020   Unstable angina (HCC) 03/20/2020   Chronic kidney disease    History of non-ST elevation myocardial infarction (NSTEMI) 02/13/2020   Moderate aortic valve stenosis 02/13/2020   Acute on chronic combined systolic and diastolic CHF (congestive heart failure) ([place]) 02/13/2020   Hypertensive urgency 02/13/2020   Fall with injury 11/16/2019   Leg weakness, bilateral 11/16/2019   Benign hypertensive kidney disease with chronic kidney disease 09/02/2019   Proteinuria 09/02/2019   Secondary hyperparathyroidism of renal origin ([place]) 09/02/2019   Left arm pain-due to ischemia secondary to steal syndrome 02/11/2019   Gastroesophageal reflux 03/25/2018   Atherosclerosis of aorta ([place]) 09/04/2017    Thoracic aortic aneurysm ([place]) 09/04/2017   Right thyroid nodule 04/12/2017   Thrombocytopenia ([place]) 03/16/2017   CKD stage 4 due to type 2 diabetes mellitus ([place]) 03/15/2017   Advance directive discussed with patient 03/15/2017   BMI 40.0-44.9, adult ([place]) 09/12/2016   Psoriatic arthritis ([place])    Lung nodule < 6cm on CT 03/29/2016   COPD with acute bronchitis ([place]) 03/29/2016   Headache 09/14/2015   Constipation 10/05/2014   PVD (peripheral vascular disease) ([place]) 04/23/2014   Memory loss 04/23/2014   Preventative health care 11/23/2013   BPH (benign prostatic hyperplasia) 03/25/2013   Enlarged prostate without lower urinary tract symptoms (luts) 03/25/2013   Peripheral vascular disease due to secondary diabetes mellitus ([place]) 03/25/2013   Obesity 02/12/2013   Atherosclerotic heart disease of native coronary artery with angina pectoris ([place]) 06/06/2012   Type II diabetes mellitus with renal manifestations ([place]) 06/06/2012   Severe aortic stenosis    Low back pain 12/27/2011   Bipolar disorder ([place]) 05/28/2011   Carotid artery disease ([place]) 04/17/2011   Occlusion and stenosis of carotid artery 04/17/2011   Obstructive sleep apnea 03/05/2011   Psoriasis 03/05/2011   Type 2 diabetes mellitus with diabetic peripheral angiopathy without gangrene ([place]) 03/05/2011   Polyneuropathy 03/05/2011   3-vessel CAD    Hyperlipidemia, mixed    Essential hypertension     Palliative Care Assessment & Plan    Recommendations/Plan:  Hospice facility.    Code Status:  Code Status Orders  (From admission, onward)         Start     Ordered   06/14/20 1402  Do not attempt resuscitation (DNR)  Continuous       Question Answer Comment  In the event of cardiac or respiratory ARREST Do not call a code blue   In the event of cardiac or respiratory ARREST Do not perform Intubation, CPR, defibrillation or ACLS   In the event of cardiac or respiratory  ARREST Use medication by any route, position, wound care, and other measures to relive pain and suffering. May use oxygen, suction and manual treatment of airway obstruction as needed for comfort.   Comments MOST form in [place]      06/14/20 1402        Code Status History    Date Active Date Inactive Code Status Order ID Comments User Context   06/11/2020 1143 06/14/2020 1402 Full Code [id number]  Ivor Costa, MD Inpatient   05/20/2020 0948 05/26/2020 1736 Full Code [id number]  Collier Bullock, MD Inpatient   04/09/2020 1410 04/12/2020 1815 Full Code [id number]  Collier Bullock, MD ED   03/22/2020 0110 03/25/2020 2051 Full Code [id number]  Fay Records, MD Inpatient   03/22/2020 0102 03/22/2020 0110 Full Code [id number]  Fay Records, MD Inpatient   03/20/2020 0900 03/21/2020 2111 Full Code [id number]  Fritzi Mandes, MD ED   02/13/2020 0253 02/18/2020 1841 Full Code [id number]  Athena Masse, MD ED   06/09/2019 1627 06/09/2019 2051 Full Code [id number]  Schnier, Dolores Lory, MD Inpatient   Advance Care Planning Activity    Advance Directive Documentation     Most Recent Value  Type of Advance Directive Living will  Pre-existing out of facility DNR order (yellow form or pink MOST form) --  "MOST" Form in Place? --       Prognosis:   < 2 weeks Wants to stop all life prolonging care.     Care plan was discussed with TOC, primary MD, nephrology.   Thank you for allowing the Palliative Medicine Team to assist in the care of this patient.   Time In: 9:00 Time Out: 11:00 Total Time 120 min Prolonged Time Billed  yes      Greater than 50%  of this time was spent counseling and coordinating care related to the above assessment and plan.  Asencion Gowda, NP  Please contact Palliative Medicine Team phone at [phone] for questions and concerns.

## 2020-06-15 NOTE — Progress Notes (Signed)
[facility]  ROUNDING NOTE   Subjective:   Nephrology was called in to patient's room to participate in establishing goals of care.  Patient's family and the palliative care and the nurse practitioner was at the bedside.  Patient was fully awake and alert.  Patient was clear in stating that he do not want any more aggressive management or medications, wishes to go forward with comfort care.   Objective:  Vital signs in last 24 hours:  Temp:  [97.6 F (36.4 C)-98.6 F (37 C)] 98.6 F (37 C) (11/24 1118) Pulse Rate:  [79-85] 85 (11/24 1118) Resp:  [18-20] 20 (11/24 1118) BP: (110-141)/(66-84) 141/83 (11/24 1118) SpO2:  [91 %-100 %] 93 % (11/24 1118)  Weight change:  Filed Weights   06/11/20 1139 06/13/20 1432 06/14/20 0500  Weight: 107.3 kg 106.9 kg 105.3 kg    Intake/Output: I/O last 3 completed shifts: In: 543.7 [P.O.:240; I.V.:191.7; IV Piggyback:112] Out: 1350 [Urine:1350]   Intake/Output this shift:  No intake/output data recorded.  Physical Exam: General:  Awake, alert, participating in conversation  Head:  Moist oral mucosal membranes  Eyes: Anicteric  Lungs:   Respirations even, unlabored  Heart: Regular rate and rhythm  Abdomen:  Soft, nontender, non distended  Extremities:  1+  peripheral edema.Lt Hand 2+ swelling  Neurologic:  Awake, alert, speech clear and appropriate  Skin: No acute lesions or rashes    Basic Metabolic Panel: Recent Labs  Lab 06/11/20 0830 06/11/20 0830 06/12/20 0541 06/12/20 0541 06/13/20 0532 06/14/20 0423 06/15/20 0634  NA 133*  --  135  --  133* 135 137  K 3.4*  --  3.7  --  3.7 4.0 3.6  CL 92*  --  95*  --  93* 96* 98  CO2 25  --  24  --  26 26 24   GLUCOSE 211*  --  162*  --  153* 142* 172*  BUN 110*  --  104*  --  116* 108* 114*  CREATININE 4.24*  --  3.82*  --  3.92* 4.10* 3.80*  CALCIUM 8.4*   < > 8.3*   < > 8.3* 8.3* 8.4*  MG 2.6*  --   --   --   --  2.4 2.3  PHOS  --   --   --   --   --  7.0* 6.3*   <  > = values in this interval not displayed.    Liver Function Tests: No results for input(s): AST, ALT, ALKPHOS, BILITOT, PROT, ALBUMIN in the last 168 hours. No results for input(s): LIPASE, AMYLASE in the last 168 hours. No results for input(s): AMMONIA in the last 168 hours.  CBC: Recent Labs  Lab 06/11/20 0830 06/12/20 0541 06/13/20 0532 06/14/20 0423 06/15/20 0634  WBC 14.4* 10.5 12.3* 16.2* 13.9*  NEUTROABS 12.4*  --  9.7* 13.9* 11.6*  HGB 8.6* 8.6* 8.2* 7.4* 7.8*  HCT 27.9* 29.2* 26.8* 24.6* 25.6*  MCV 90.6 92.1 89.3 90.8 91.8  PLT 259 217 214 193 194    Cardiac Enzymes: No results for input(s): CKTOTAL, CKMB, CKMBINDEX, TROPONINI in the last 168 hours.  BNP: Invalid input(s): POCBNP  CBG: Recent Labs  Lab 06/14/20 1211 06/14/20 1642 06/14/20 2104 06/15/20 0855 06/15/20 1114  GLUCAP 136* 234* 189* 153* 144*    Microbiology: Results for orders placed or performed during the hospital encounter of 06/11/20  Resp Panel by RT-PCR (Flu A&B, Covid) Nasopharyngeal Swab     Status: None  Collection Time: 06/11/20  8:30 AM   Specimen: Nasopharyngeal Swab; Nasopharyngeal(NP) swabs in vial transport medium  Result Value Ref Range Status   SARS Coronavirus 2 by RT PCR NEGATIVE NEGATIVE Final    Comment: (NOTE) SARS-CoV-2 target nucleic acids are NOT DETECTED.  The SARS-CoV-2 RNA is generally detectable in upper respiratory specimens during the acute phase of infection. The lowest concentration of SARS-CoV-2 viral copies this assay can detect is 138 copies/mL. A negative result does not preclude SARS-Cov-2 infection and should not be used as the sole basis for treatment or other patient management decisions. A negative result may occur with  improper specimen collection/handling, submission of specimen other than nasopharyngeal swab, presence of viral mutation(s) within the areas targeted by this assay, and inadequate number of viral copies(<138 copies/mL). A  negative result must be combined with clinical observations, patient history, and epidemiological information. The expected result is Negative.  Fact Sheet for Patients:  EntrepreneurPulse.com.au  Fact Sheet for Healthcare Providers:  IncredibleEmployment.be  This test is no t yet approved or cleared by the [place] FDA and  has been authorized for detection and/or diagnosis of SARS-CoV-2 by FDA under an Emergency Use Authorization (EUA). This EUA will remain  in effect (meaning this test can be used) for the duration of the COVID-19 declaration under Section 564(b)(1) of the Act, 21 U.S.C.section 360bbb-3(b)(1), unless the authorization is terminated  or revoked sooner.       Influenza A by PCR NEGATIVE NEGATIVE Final   Influenza B by PCR NEGATIVE NEGATIVE Final    Comment: (NOTE) The Xpert Xpress SARS-CoV-2/FLU/RSV plus assay is intended as an aid in the diagnosis of influenza from Nasopharyngeal swab specimens and should not be used as a sole basis for treatment. Nasal washings and aspirates are unacceptable for Xpert Xpress SARS-CoV-2/FLU/RSV testing.  Fact Sheet for Patients: EntrepreneurPulse.com.au  Fact Sheet for Healthcare Providers: IncredibleEmployment.be  This test is not yet approved or cleared by the [place] FDA and has been authorized for detection and/or diagnosis of SARS-CoV-2 by FDA under an Emergency Use Authorization (EUA). This EUA will remain in effect (meaning this test can be used) for the duration of the COVID-19 declaration under Section 564(b)(1) of the Act, 21 U.S.C. section 360bbb-3(b)(1), unless the authorization is terminated or revoked.  Performed at [facility], [address] [place], [place] [address]   MRSA PCR Screening     Status: None   Collection Time: 06/13/20  5:40 AM   Specimen: Nasal Mucosa; Nasopharyngeal  Result Value Ref Range  Status   MRSA by PCR NEGATIVE NEGATIVE Final    Comment:        The GeneXpert MRSA Assay (FDA approved for NASAL specimens only), is one component of a comprehensive MRSA colonization surveillance program. It is not intended to diagnose MRSA infection nor to guide or monitor treatment for MRSA infections. Performed at [facility], [address] [place], [place] [address]    *Note: Due to a large number of results and/or encounters for the requested time period, some results have not been displayed. A complete set of results can be found in Results Review.    Coagulation Studies: No results for input(s): LABPROT, INR in the last 72 hours.  Urinalysis: No results for input(s): COLORURINE, LABSPEC, PHURINE, GLUCOSEU, HGBUR, BILIRUBINUR, KETONESUR, PROTEINUR, UROBILINOGEN, NITRITE, LEUKOCYTESUR in the last 72 hours.  Invalid input(s): APPERANCEUR    Imaging: No results found.   Medications:   . furosemide 120 mg (06/14/20  1213)   . ARIPiprazole  2 mg Oral QHS  . busPIRone  20 mg Oral BID  . carvedilol  3.125 mg Oral BID WC  . finasteride  5 mg Oral Daily  . gabapentin  100 mg Oral BID  . isosorbide mononitrate  30 mg Oral Daily  . nicotine  21 mg Transdermal Daily  . sertraline  100 mg Oral Daily  . tamsulosin  0.4 mg Oral Daily   acetaminophen, albuterol, alum & mag hydroxide-simeth, antiseptic oral rinse, dextromethorphan-guaiFENesin, glycopyrrolate **OR** [DISCONTINUED] glycopyrrolate **OR** glycopyrrolate, haloperidol **OR** haloperidol **OR** haloperidol lactate, [DISCONTINUED] LORazepam **OR** LORazepam **OR** LORazepam, morphine injection, nitroGLYCERIN, ondansetron (ZOFRAN) IV, oxyCODONE, polyvinyl alcohol, polyvinyl alcohol  Assessment/ Plan:  [name] is a 64 y.o. white male with peripheral vascular disease, hypertension, depression, severe aortic stenosis, diabetes mellitus type II, COPD, coronary artery disease, congestive heart failure  who was admitted to [facility] on 06/11/2020 for Bilateral leg pain [M79.604, M79.605] Left arm pain [M79.602] Steal syndrome as complication of dialysis access ([place]) [Z48.270B]  #. Chronic kidney disease stage V:  with complication of dialysis device.  Lab Results  Component Value Date   CREATININE 3.80 (H) 06/15/2020   CREATININE 4.10 (H) 06/14/2020   CREATININE 3.92 (H) 06/13/2020   Patient status post ligation of aVF Hemodialysis was offered, but after today's family meeting along with palliative care nurse practitioner, patient chose comfort care No plan for dialysis # Anemia of chronic kidney disease:  Lab Results  Component Value Date   HGB 7.8 (L) 06/15/2020  Will continue monitoring  #Hypertension:  BP 141/83, within acceptable range  4. Secondary Hyperparathyroidism:  Lab Results  Component Value Date   CALCIUM 8.4 (L) 06/15/2020   CAION 1.06 (L) 07/02/2016   PHOS 6.3 (H) 06/15/2020  No acute interventions as per patient's wish   LOS: 4 [name] [name] 11/24/20213:15 PM

## 2020-06-15 NOTE — Progress Notes (Signed)
[facility] Liaison note: New referral for family interest in [facility]. Patient information sent to referral.Hospice home eligibility has been confirmed. Writer met in the room with patient and his step son Waunita Schooner to offer a bed, which he accepted. Writer  initiated education regarding hospice services, philosophy, team approach to care and current visitation policy with understanding voiced. Questions answered.  Plan is for patient to discharge to the hospice home today with 5:30 pm transport set with [facility]. Signed out of facility DNR in place in discharge packet. Hospital care team and family  updated. Please leave peripheral IV in place. Report has been called to the hospice home. Thank you for the opportunity to be involved in the care of this patient and his family.  Flo Shanks BSN, RN, [place] [phone]

## 2020-06-15 NOTE — TOC Transition Note (Signed)
Transition of Care [facility]) - CM/SW Discharge Note   Patient Details  Name: [name] [name] MRN: [id number] Date of Birth: 10-16-1955  Transition [facility]) CM/SW Contact:  Beverly Sessions, RN Phone Number: 06/15/2020, 2:13 PM   Clinical Narrative:    Donella Stade from palliative made referral to Santiago Glad with [facility] for residential hospice  Patient and son confirm the plan is for hospice home in [place].   Patient to discharge today EMS packet and signed DNR on chart  EMS transport with [facility] set for 530 pm    Final next level of care: [place] Barriers to Discharge: No Barriers Identified   Patient Goals and CMS Choice        Discharge Placement                Patient to be transferred to facility by: EMS Name of family member notified: son Patient and family notified of of transfer: 06/15/20  Discharge Plan and Services                                     Social Determinants of Health (SDOH) Interventions     Readmission Risk Interventions Readmission Risk Prevention Plan 06/15/2020 05/26/2020 03/22/2020  Transportation Screening Complete Complete Complete  PCP or Specialist Appt within 3-5 Days - - -  HRI or [place] Work Consult for [place] - - -  Medication Review Press photographer) Complete Referral to Pharmacy Complete  PCP or Specialist appointment within 3-5 days of discharge (No Data) Complete Complete  HRI or [place] (No Data) Complete Complete  SW Recovery Care/Counseling Consult Complete Complete Complete  Palliative Care Screening Complete Not Applicable Not [place] Not Applicable Not Applicable Not Applicable  Some recent data might be hidden

## 2020-06-15 NOTE — Discharge Summary (Addendum)
Physician Discharge Summary  [name] [name] VWU:[id number] DOB: May 02, 1956 DOA: 06/11/2020  PCP: [name], MD  Admit date: 06/11/2020 Discharge date: 06/15/2020  Admitted From: home  Disposition:  Hospice at a facility   Recommendations for Outpatient Follow-up:  1. Follow up with hospice ASAP   Home Health: no  Equipment/Devices:  Discharge Condition: hospice CODE STATUS: DNR Diet recommendation: as tolerated  Brief/Interim Summary: HPI was taken from Dr. Blaine Hamper: [name] [name] is a 64 y.o. 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  Patient had AVF placed in left arm. He developed ischemia with a large patent left brachiocephalic AV fistula creating steal syndrome. Pt has been treated by [facility]. He was recently treated for left upper extremity ischemia with 3 separate intervention sites. Patient states that his left arm pain did not improve.  He continues to have constant, sharp, severe pain in the left arm. Pt also has bilateral feet, which is burning pain. He has exertional shortness of breath.  Denies cough, fever or chills.  No nausea vomiting or diarrhea, abdominal pain, symptoms of UTI.   ED Course: pt was found to have WBC 14.4, INR 1.1, negative Covid PCR, potassium 3.4, creatinine is up from 3.61 on 06/02/20 to 4.24, BUN 110 today, temperature normal, blood pressure 112/68, heart rate 104, RR 20, oxygen saturation 99% on room air.  Lower extremity Dopplers negative for DVT.  Patient is admitted to [facility] bed as inpatient.  Dr. Lucky Cowboy of [facility] is consulted.  Dr. Theador Hawthorne of renal is consulted.  Hospital Course from Dr. Lenise Herald 06/15/20: Pt  presented w/ left arm ischemia secondary to AV fistula related to steal syndrome and is s/p coil embolization of the cephalic vein. Pt initially indecisive about wanting HD catheter so palliative care was consulted. After discussion with the pt and pt's family, they decided to proceed w/ comfort care only. Pt will go to a hospice facility for further care. For more information, please see previous progress/consult notes.    Discharge Diagnoses:  Principal Problem:   Left arm pain-due to ischemia secondary to steal syndrome Active Problems:   Hyperlipidemia, mixed   Essential hypertension   PVD (peripheral vascular disease) (HCC)   BPH (benign prostatic hyperplasia)   Type II diabetes mellitus with renal manifestations (HCC)   Chronic heart failure with preserved ejection fraction (HFpEF) (HCC)   CAD (coronary artery disease)   CKD (chronic kidney disease), stage IV (HCC)   Depression   Anemia in chronic kidney disease   COPD (chronic obstructive pulmonary disease) (HCC)   Stroke (HCC)   Hypokalemia   Leukocytosis   Combined congestive systolic and diastolic heart failure ([facility])   Hypotension Failure to thrive: secondary to all below. Started on comfort care   Left arm ischemia: secondary to AV fistula related steal syndrome. S/p coil embolization of the cephalic vein.  Postop hypotension: previously on pressors which have been weaned off. Resolved   CKD V: possible HD. Management as per nephro   Chronic combined CHF: EF 30-35%.   ACD: w/ iron deficiency as well. S/p IV iron. Will transfuse if Hb <7.0   Discharge Instructions  Discharge Instructions    Diet - low sodium heart healthy  Complete by: As directed    Discharge instructions   Complete by: As directed    F/u w/ hospice provider ASAP   Increase activity slowly   Complete by: As directed      Allergies as of 06/15/2020      Reactions   Contrast Media [iodinated Diagnostic Agents] Rash, Other (See Comments)    Got very hot and red   Glipizide Other (See Comments)   ANTIDIABETICS. Burning   Hydroxychloroquine Other (See Comments)   Stomach upset   Metrizamide Other (See Comments)   Got very hot and red   Penicillins Hives, Swelling   Has patient had a PCN reaction causing immediate rash, facial/tongue/throat swelling, SOB or lightheadedness with hypotension: yes Has patient had a PCN reaction causing severe rash involving mucus membranes or skin necrosis: no  Has patient had a PCN reaction that required hospitalization: yes Has patient had a PCN reaction occurring within the last 10 years: no If all of the above answers are "NO", then may proceed with Cephalosporin use.      Medication List    STOP taking these medications   albuterol 108 (90 Base) MCG/ACT inhaler Commonly known as: ProAir HFA   aspirin EC 81 MG tablet   B-complex with vitamin C tablet   calcitRIOL 0.25 MCG capsule Commonly known as: ROCALTROL   carvedilol 3.125 MG tablet Commonly known as: COREG   clopidogrel 75 MG tablet Commonly known as: PLAVIX   Contour Test test strip Generic drug: glucose blood   finasteride 5 MG tablet Commonly known as: PROSCAR   insulin aspart 100 UNIT/ML injection Commonly known as: novoLOG   insulin regular 100 units/mL injection Commonly known as: NOVOLIN R   Insulin Syringe-Needle U-100 30G X 15/64" 1 ML Misc   isosorbide mononitrate 30 MG 24 hr tablet Commonly known as: IMDUR   rosuvastatin 10 MG tablet Commonly known as: CRESTOR   tamsulosin 0.4 MG Caps capsule Commonly known as: FLOMAX   torsemide 20 MG tablet Commonly known as: DEMADEX     TAKE these medications   acetaminophen 500 MG tablet Commonly known as: TYLENOL Take 500 mg by mouth every 6 (six) hours as needed for moderate pain or headache.   alum & mag hydroxide-simeth 200-200-20 MG/5ML suspension Commonly known as: MAALOX/MYLANTA Take 30 mLs by mouth every 6 (six) hours as needed for indigestion  or heartburn.   ARIPiprazole 2 MG tablet Commonly known as: ABILIFY Take 1 tablet (2 mg total) by mouth at bedtime.   Artificial Tears 0.1-0.3 % Soln Generic drug: Dextran 70-Hypromellose Place 1 drop into both eyes 4 (four) times daily as needed (dry eyes).   busPIRone 10 MG tablet Commonly known as: BUSPAR TAKE 2 TABLETS(20 MG) BY MOUTH TWICE DAILY What changed:   how much to take  how to take this  when to take this  additional instructions   feeding supplement Liqd Take 237 mLs by mouth daily.   nitroGLYCERIN 0.4 MG SL tablet Commonly known as: NITROSTAT DISSOLVE 1 TABLET UNDER THE TONGUE EVERY 5 MINUTES AS NEEDED FOR CHEST PAIN. DO NOT EXCEED A TOTAL OF 3 DOSES IN 15 MINUTES. What changed: See the new instructions.   oxyCODONE 5 MG immediate release tablet Commonly known as: Oxy IR/ROXICODONE Take 1-2 tablets (5-10 mg total) by mouth every 3 (three) hours as needed for up to 3 days for moderate pain or severe pain (5mg  for moderate pain, 10mg  for severe pain. Also for dyspnea).   sertraline 100  MG tablet Commonly known as: ZOLOFT Take 1 tablet (100 mg total) by mouth daily.       Allergies  Allergen Reactions  . Contrast Media [Iodinated Diagnostic Agents] Rash and Other (See Comments)    Got very hot and red   . Glipizide Other (See Comments)    ANTIDIABETICS. Burning  . Hydroxychloroquine Other (See Comments)    Stomach upset  . Metrizamide Other (See Comments)    Got very hot and red  . Penicillins Hives and Swelling    Has patient had a PCN reaction causing immediate rash, facial/tongue/throat swelling, SOB or lightheadedness with hypotension: yes Has patient had a PCN reaction causing severe rash involving mucus membranes or skin necrosis: no  Has patient had a PCN reaction that required hospitalization: yes Has patient had a PCN reaction occurring within the last 10 years: no If all of the above answers are "NO", then may proceed with Cephalosporin  use.     Consultations:  Vascular surg  Nephro   Pulmon   Procedures/Studies: [place] Dialysis Access  Result Date: 06/12/2020 CLINICAL DATA:  64 year old with left upper extremity ischemia and left brachiocephalic AV fistula. Recent balloon angioplasty of left ulnar artery and left brachial artery. Stent placement in left brachial artery. EXAM: VASCULAR ULTRASOUND LEFT UPPER EXTREMITY AV FISTULA TECHNIQUE: Vascular imaging was obtained of the left upper extremity AV fistula. COMPARISON:  None. FINDINGS: Limited imaging was obtained of the left upper extremity. There is an arteriovenous anastomosis that is patent. Vessels appear to be incorrectly labeled on this examination and limited evaluation. IMPRESSION: Limited evaluation of the left upper extremity AV fistula. The AV anastomosis appears to be patent. Electronically Signed   By: Markus Daft M.D.   On: 06/12/2020 11:52   PERIPHERAL VASCULAR CATHETERIZATION  Result Date: 06/13/2020 See op note  PERIPHERAL VASCULAR CATHETERIZATION  Result Date: 06/06/2020 See op note  US Venous Img Lower Bilateral  Result Date: 06/11/2020 CLINICAL DATA:  Bilateral lower extremity pain and swelling. EXAM: BILATERAL LOWER EXTREMITY VENOUS DOPPLER ULTRASOUND TECHNIQUE: Gray-scale sonography with compression, as well as color and duplex ultrasound, were performed to evaluate the deep venous system(s) from the level of the common femoral vein through the popliteal and proximal calf veins. COMPARISON:  None. FINDINGS: BILATERAL LOWER EXTREMITY VENOUS: Normal compressibility of the common femoral, superficial femoral, and popliteal veins, as well as the visualized calf veins. Visualized portions of profunda femoral vein and great saphenous vein unremarkable. No filling defects to suggest DVT on grayscale or color Doppler imaging. Doppler waveforms show normal direction of venous flow, normal respiratory plasticity and response to augmentation. OTHER None.  Limitations: Patient movement and mild subcutaneous edema. IMPRESSION: Negative for DVT bilateral lower extremities. Electronically Signed   By: Marin Olp M.D.   On: 06/11/2020 09:29   DG Chest Port 1 View  Result Date: 06/13/2020 CLINICAL DATA:  PICC line placement EXAM: PORTABLE CHEST 1 VIEW COMPARISON:  05/20/2020 FINDINGS: Right neck PICC line tip in the SVC 3 cm above the right atrium. No pneumothorax. Mild fluid overload/congestive heart failure. Poor inspiration with some volume loss at both lung bases. Previous median sternotomy and CABG. IMPRESSION: 1. PICC line well positioned with tip in the SVC 3 cm above the right atrium. 2. Mild fluid overload/congestive heart failure. Electronically Signed   By: Nelson Chimes M.D.   On: 06/13/2020 11:46   DG Chest Portable 1 View  Result Date: 05/20/2020 CLINICAL DATA:  Shortness of breath EXAM: PORTABLE CHEST 1  VIEW COMPARISON:  04/09/2020 FINDINGS: Post CABG changes. Stable cardiomediastinal contours. Pulmonary vascular congestion mild diffuse interstitial opacities throughout both lungs. Suspect trace bilateral pleural effusions. No pneumothorax. IMPRESSION: Findings suggestive of CHF with mild pulmonary edema. These results will be called to the ordering clinician or representative by the Radiologist Assistant, and communication documented in the PACS or [employer]. Electronically Signed   By: Davina Poke D.O.   On: 05/20/2020 08:19   [place] OR NERVE BLOCK-IMAGE ONLY [facility])  Result Date: 05/24/2020 There is no interpretation for this exam.  This order is for images obtained during a surgical procedure.  Please See "Surgeries" Tab for more information regarding the procedure.   [place] OR NERVE BLOCK-IMAGE ONLY [facility])  Result Date: 05/20/2020 There is no interpretation for this exam.  This order is for images obtained during a surgical procedure.  Please See "Surgeries" Tab for more information regarding the procedure.      Subjective:  pt c/o malaise   Discharge Exam: Vitals:   06/15/20 0849 06/15/20 1118  BP: 135/76 (!) 141/83  Pulse: 80 85  Resp:  20  Temp:  98.6 F (37 C)  SpO2: 95% 93%   Vitals:   06/15/20 0145 06/15/20 0452 06/15/20 0849 06/15/20 1118  BP:  133/84 135/76 (!) 141/83  Pulse:  80 80 85  Resp: 20 20  20   Temp:  97.6 F (36.4 C)  98.6 F (37 C)  TempSrc:      SpO2:  91% 95% 93%  Weight:      Height:        General: Pt is alert, awake, not in acute distress Cardiovascular:S1/S2 +, no rubs, no gallops Respiratory: decreased breath sounds b/l  Abdominal: Soft, NT, obese, bowel sounds + Extremities:  no cyanosis    The results of significant diagnostics from this hospitalization (including imaging, microbiology, ancillary and laboratory) are listed below for reference.     Microbiology: Recent Results (from the past 240 hour(s))  Resp Panel by RT-PCR (Flu A&B, Covid) Nasopharyngeal Swab     Status: None   Collection Time: 06/11/20  8:30 AM   Specimen: Nasopharyngeal Swab; Nasopharyngeal(NP) swabs in vial transport medium  Result Value Ref Range Status   SARS Coronavirus 2 by RT PCR NEGATIVE NEGATIVE Final    Comment: (NOTE) SARS-CoV-2 target nucleic acids are NOT DETECTED.  The SARS-CoV-2 RNA is generally detectable in upper respiratory specimens during the acute phase of infection. The lowest concentration of SARS-CoV-2 viral copies this assay can detect is 138 copies/mL. A negative result does not preclude SARS-Cov-2 infection and should not be used as the sole basis for treatment or other patient management decisions. A negative result may occur with  improper specimen collection/handling, submission of specimen other than nasopharyngeal swab, presence of viral mutation(s) within the areas targeted by this assay, and inadequate number of viral copies(<138 copies/mL). A negative result must be combined with clinical observations, patient history, and  epidemiological information. The expected result is Negative.  Fact Sheet for Patients:  EntrepreneurPulse.com.au  Fact Sheet for Healthcare Providers:  IncredibleEmployment.be  This test is no t yet approved or cleared by the [place] FDA and  has been authorized for detection and/or diagnosis of SARS-CoV-2 by FDA under an Emergency Use Authorization (EUA). This EUA will remain  in effect (meaning this test can be used) for the duration of the COVID-19 declaration under Section 564(b)(1) of the Act, 21 U.S.C.section 360bbb-3(b)(1), unless the authorization is terminated  or revoked sooner.  Influenza A by PCR NEGATIVE NEGATIVE Final   Influenza B by PCR NEGATIVE NEGATIVE Final    Comment: (NOTE) The Xpert Xpress SARS-CoV-2/FLU/RSV plus assay is intended as an aid in the diagnosis of influenza from Nasopharyngeal swab specimens and should not be used as a sole basis for treatment. Nasal washings and aspirates are unacceptable for Xpert Xpress SARS-CoV-2/FLU/RSV testing.  Fact Sheet for Patients: EntrepreneurPulse.com.au  Fact Sheet for Healthcare Providers: IncredibleEmployment.be  This test is not yet approved or cleared by the [place] FDA and has been authorized for detection and/or diagnosis of SARS-CoV-2 by FDA under an Emergency Use Authorization (EUA). This EUA will remain in effect (meaning this test can be used) for the duration of the COVID-19 declaration under Section 564(b)(1) of the Act, 21 U.S.C. section 360bbb-3(b)(1), unless the authorization is terminated or revoked.  Performed at [facility], [address] [place], [place] [address]   MRSA PCR Screening     Status: None   Collection Time: 06/13/20  5:40 AM   Specimen: Nasal Mucosa; Nasopharyngeal  Result Value Ref Range Status   MRSA by PCR NEGATIVE NEGATIVE Final    Comment:        The GeneXpert MRSA  Assay (FDA approved for NASAL specimens only), is one component of a comprehensive MRSA colonization surveillance program. It is not intended to diagnose MRSA infection nor to guide or monitor treatment for MRSA infections. Performed at [facility], [address] [place], [place] [address]      Labs: BNP (last 3 results) Recent Labs    02/13/20 0033 03/20/20 0620  BNP 713.3* 778.2*   Basic Metabolic Panel: Recent Labs  Lab 06/11/20 0830 06/12/20 0541 06/13/20 0532 06/14/20 0423 06/15/20 0634  NA 133* 135 133* 135 137  K 3.4* 3.7 3.7 4.0 3.6  CL 92* 95* 93* 96* 98  CO2 25 24 26 26 24   GLUCOSE 211* 162* 153* 142* 172*  BUN 110* 104* 116* 108* 114*  CREATININE 4.24* 3.82* 3.92* 4.10* 3.80*  CALCIUM 8.4* 8.3* 8.3* 8.3* 8.4*  MG 2.6*  --   --  2.4 2.3  PHOS  --   --   --  7.0* 6.3*   Liver Function Tests: No results for input(s): AST, ALT, ALKPHOS, BILITOT, PROT, ALBUMIN in the last 168 hours. No results for input(s): LIPASE, AMYLASE in the last 168 hours. No results for input(s): AMMONIA in the last 168 hours. CBC: Recent Labs  Lab 06/11/20 0830 06/12/20 0541 06/13/20 0532 06/14/20 0423 06/15/20 0634  WBC 14.4* 10.5 12.3* 16.2* 13.9*  NEUTROABS 12.4*  --  9.7* 13.9* 11.6*  HGB 8.6* 8.6* 8.2* 7.4* 7.8*  HCT 27.9* 29.2* 26.8* 24.6* 25.6*  MCV 90.6 92.1 89.3 90.8 91.8  PLT 259 217 214 193 194   Cardiac Enzymes: No results for input(s): CKTOTAL, CKMB, CKMBINDEX, TROPONINI in the last 168 hours. BNP: Invalid input(s): POCBNP CBG: Recent Labs  Lab 06/14/20 1211 06/14/20 1642 06/14/20 2104 06/15/20 0855 06/15/20 1114  GLUCAP 136* 234* 189* 153* 144*   D-Dimer No results for input(s): DDIMER in the last 72 hours. Hgb A1c No results for input(s): HGBA1C in the last 72 hours. Lipid Profile No results for input(s): CHOL, HDL, LDLCALC, TRIG, CHOLHDL, LDLDIRECT in the last 72 hours. Thyroid function studies No results for input(s): TSH,  T4TOTAL, T3FREE, THYROIDAB in the last 72 hours.  Invalid input(s): FREET3 Anemia work up No results for input(s): VITAMINB12, FOLATE, FERRITIN, TIBC, IRON, RETICCTPCT in the  last 72 hours. Urinalysis    Component Value Date/Time   COLORURINE YELLOW (A) 06/11/2020 1257   APPEARANCEUR HAZY (A) 06/11/2020 1257   APPEARANCEUR Clear 06/13/2015 1012   LABSPEC 1.016 06/11/2020 1257   PHURINE 5.0 06/11/2020 1257   GLUCOSEU NEGATIVE 06/11/2020 1257   HGBUR NEGATIVE 06/11/2020 [place] 06/11/2020 1257   BILIRUBINUR Negative 06/13/2015 1012   KETONESUR NEGATIVE 06/11/2020 1257   PROTEINUR 100 (A) 06/11/2020 1257   UROBILINOGEN 0.2 12/21/2014 1336   NITRITE NEGATIVE 06/11/2020 1257   LEUKOCYTESUR NEGATIVE 06/11/2020 1257   Sepsis Labs Invalid input(s): PROCALCITONIN,  WBC,  LACTICIDVEN Microbiology Recent Results (from the past 240 hour(s))  Resp Panel by RT-PCR (Flu A&B, Covid) Nasopharyngeal Swab     Status: None   Collection Time: 06/11/20  8:30 AM   Specimen: Nasopharyngeal Swab; Nasopharyngeal(NP) swabs in vial transport medium  Result Value Ref Range Status   SARS Coronavirus 2 by RT PCR NEGATIVE NEGATIVE Final    Comment: (NOTE) SARS-CoV-2 target nucleic acids are NOT DETECTED.  The SARS-CoV-2 RNA is generally detectable in upper respiratory specimens during the acute phase of infection. The lowest concentration of SARS-CoV-2 viral copies this assay can detect is 138 copies/mL. A negative result does not preclude SARS-Cov-2 infection and should not be used as the sole basis for treatment or other patient management decisions. A negative result may occur with  improper specimen collection/handling, submission of specimen other than nasopharyngeal swab, presence of viral mutation(s) within the areas targeted by this assay, and inadequate number of viral copies(<138 copies/mL). A negative result must be combined with clinical observations, patient history, and  epidemiological information. The expected result is Negative.  Fact Sheet for Patients:  EntrepreneurPulse.com.au  Fact Sheet for Healthcare Providers:  IncredibleEmployment.be  This test is no t yet approved or cleared by the [place] FDA and  has been authorized for detection and/or diagnosis of SARS-CoV-2 by FDA under an Emergency Use Authorization (EUA). This EUA will remain  in effect (meaning this test can be used) for the duration of the COVID-19 declaration under Section 564(b)(1) of the Act, 21 U.S.C.section 360bbb-3(b)(1), unless the authorization is terminated  or revoked sooner.       Influenza A by PCR NEGATIVE NEGATIVE Final   Influenza B by PCR NEGATIVE NEGATIVE Final    Comment: (NOTE) The Xpert Xpress SARS-CoV-2/FLU/RSV plus assay is intended as an aid in the diagnosis of influenza from Nasopharyngeal swab specimens and should not be used as a sole basis for treatment. Nasal washings and aspirates are unacceptable for Xpert Xpress SARS-CoV-2/FLU/RSV testing.  Fact Sheet for Patients: EntrepreneurPulse.com.au  Fact Sheet for Healthcare Providers: IncredibleEmployment.be  This test is not yet approved or cleared by the [place] FDA and has been authorized for detection and/or diagnosis of SARS-CoV-2 by FDA under an Emergency Use Authorization (EUA). This EUA will remain in effect (meaning this test can be used) for the duration of the COVID-19 declaration under Section 564(b)(1) of the Act, 21 U.S.C. section 360bbb-3(b)(1), unless the authorization is terminated or revoked.  Performed at [facility], [address] [place], [place] [address]   MRSA PCR Screening     Status: None   Collection Time: 06/13/20  5:40 AM   Specimen: Nasal Mucosa; Nasopharyngeal  Result Value Ref Range Status   MRSA by PCR NEGATIVE NEGATIVE Final    Comment:        The GeneXpert MRSA  Assay (FDA approved for NASAL specimens  only), is one component of a comprehensive MRSA colonization surveillance program. It is not intended to diagnose MRSA infection nor to guide or monitor treatment for MRSA infections. Performed at [facility], [address], [place], [place] [address]      Time coordinating discharge: Over 30 minutes  SIGNED:   Wyvonnia Dusky, MD  [facility] 06/15/2020, 1:21 PM Pager   If 7PM-7AM, please contact night-coverage

## 2020-06-15 NOTE — Care Management Important Message (Signed)
Important Message  Patient Details  Name: [name] [name] MRN: [id number] Date of Birth: 03-Jan-1956   Medicare Important Message Given:  Yes     Beverly Sessions, RN 06/15/2020, 12:33 PM

## 2020-06-22 ENCOUNTER — Other Ambulatory Visit (INDEPENDENT_AMBULATORY_CARE_PROVIDER_SITE_OTHER): Payer: Medicare Other

## 2020-06-22 ENCOUNTER — Ambulatory Visit (INDEPENDENT_AMBULATORY_CARE_PROVIDER_SITE_OTHER): Payer: Medicare Other | Admitting: Nurse Practitioner

## 2020-06-22 DEATH — deceased

## 2020-06-23 ENCOUNTER — Encounter (INDEPENDENT_AMBULATORY_CARE_PROVIDER_SITE_OTHER): Payer: Medicare Other

## 2020-06-23 ENCOUNTER — Ambulatory Visit (INDEPENDENT_AMBULATORY_CARE_PROVIDER_SITE_OTHER): Payer: Medicare Other | Admitting: Vascular Surgery

## 2020-06-23 ENCOUNTER — Ambulatory Visit: Payer: Medicare Other | Admitting: Cardiovascular Disease

## 2020-06-24 ENCOUNTER — Other Ambulatory Visit (INDEPENDENT_AMBULATORY_CARE_PROVIDER_SITE_OTHER): Payer: Medicare Other

## 2020-06-24 ENCOUNTER — Ambulatory Visit (INDEPENDENT_AMBULATORY_CARE_PROVIDER_SITE_OTHER): Payer: Medicare Other | Admitting: Nurse Practitioner

## 2020-07-04 ENCOUNTER — Telehealth: Payer: Medicare Other | Admitting: Internal Medicine

## 2020-07-19 ENCOUNTER — Ambulatory Visit: Payer: Medicare Other | Admitting: Internal Medicine

## 2020-07-21 ENCOUNTER — Ambulatory Visit: Payer: Medicare Other | Admitting: Gastroenterology

## 2020-08-17 ENCOUNTER — Ambulatory Visit: Payer: Medicare Other | Admitting: Physician Assistant

## 2021-09-28 IMAGING — CT CT CHEST LUNG CANCER SCREENING LOW DOSE W/O CM
2 of 5 series · 15 of 40 positions shown, 18 images · non-contrast
Comparison: Low-dose lung cancer screening CT chest dated
04/11/2018

CLINICAL DATA: 63-year-old male former smoker, quit 2 years ago,
with 40 pack-year history of smoking, for follow-up lung cancer
screening

EXAM:
CT CHEST WITHOUT CONTRAST LOW-DOSE FOR LUNG CANCER SCREENING
TECHNIQUE: Multidetector CT imaging of the chest was performed following the
standard protocol without IV contrast.

[Series 3: lung 1.00 · axial · 0.81mm/px · z∈[-1166,-860]mm · 12 of 338 slices shown, 15 images]
[im 16/338  mediastinal]
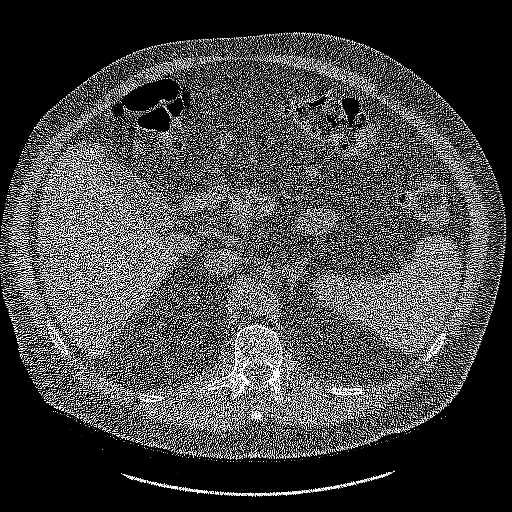
[im 16/338  lung]
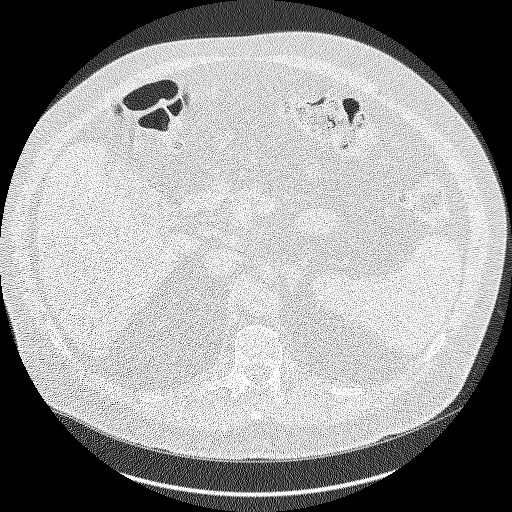
[im 46/338  lung]
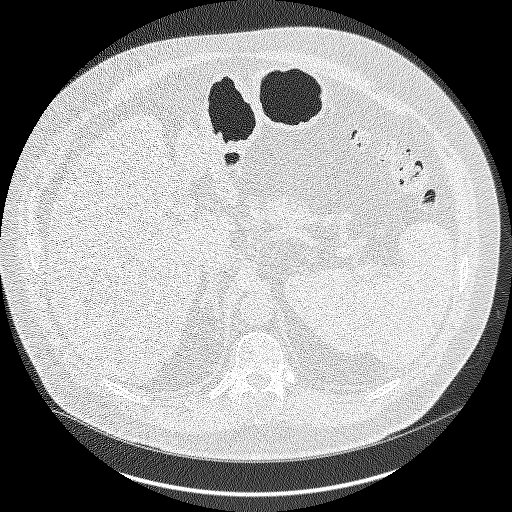
[im 77/338  lung]
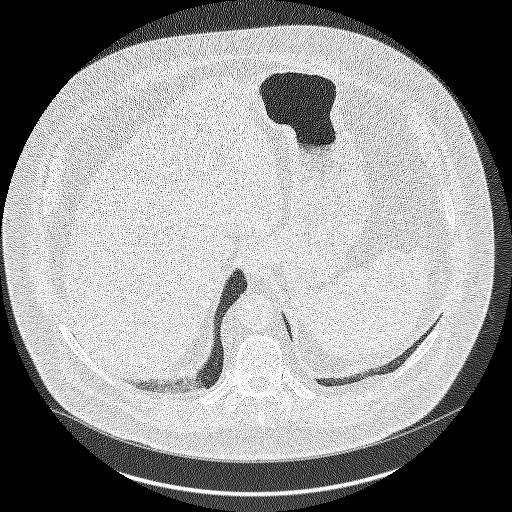
[im 108/338  lung]
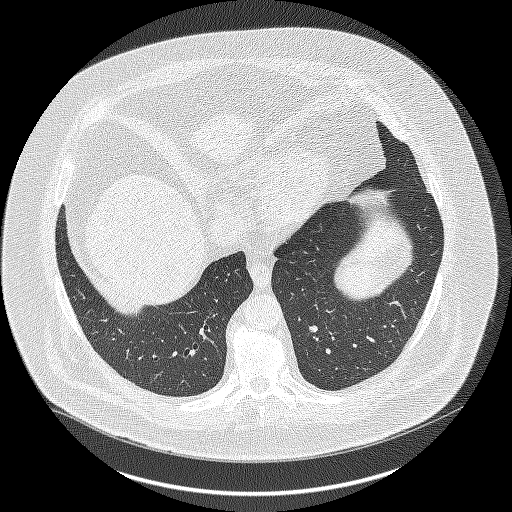
[im 123/338  mediastinal]
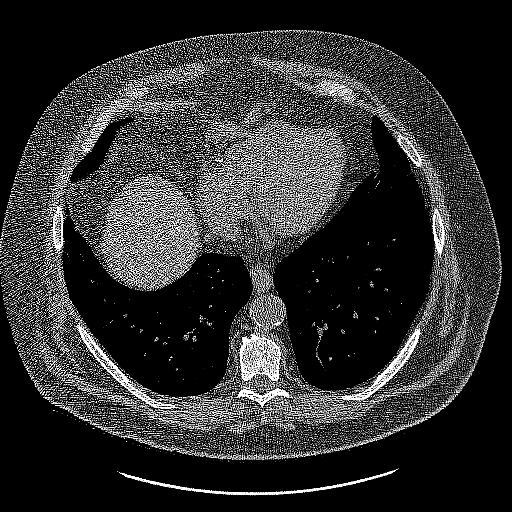
[im 123/338  lung]
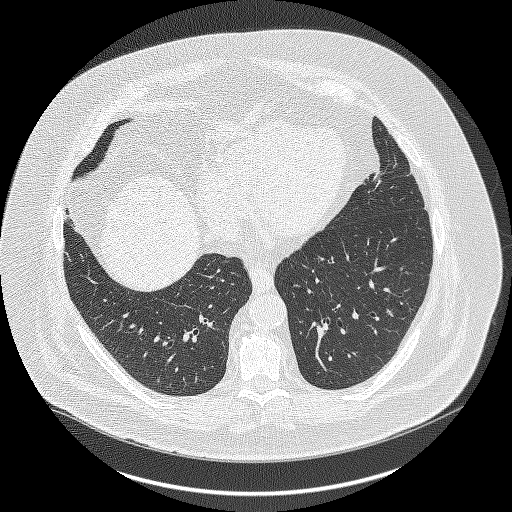
[im 154/338  lung]
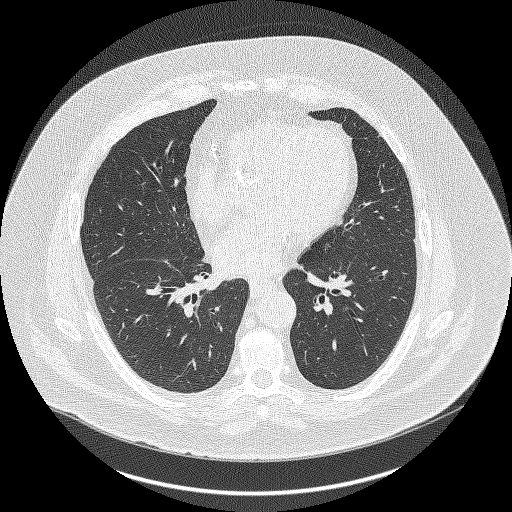
[im 184/338  lung]
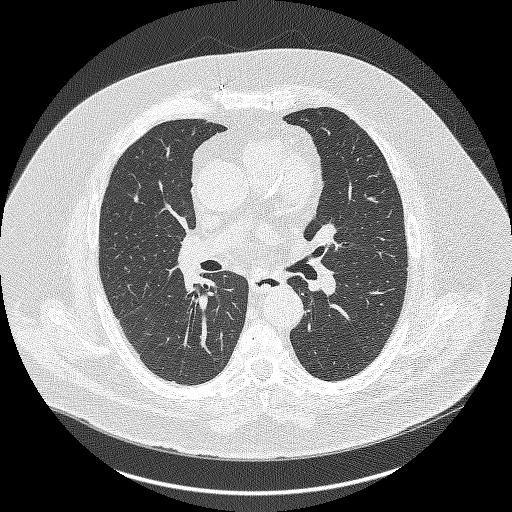
[im 215/338  lung]
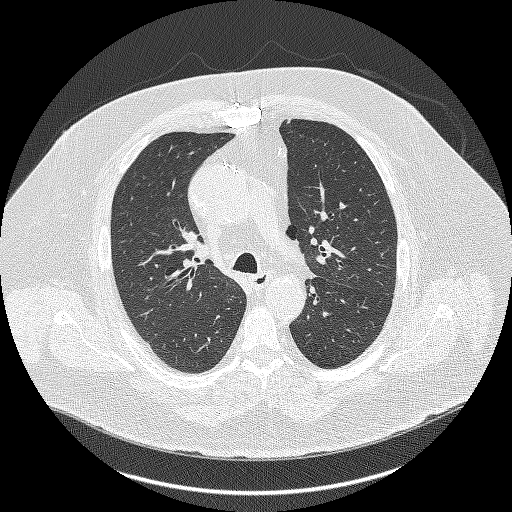
[im 230/338  mediastinal]
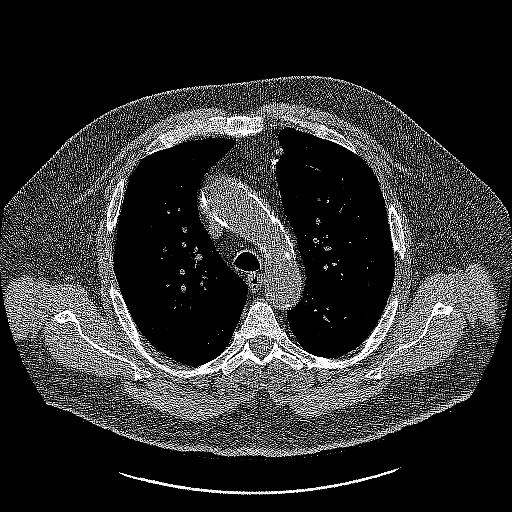
[im 230/338  lung]
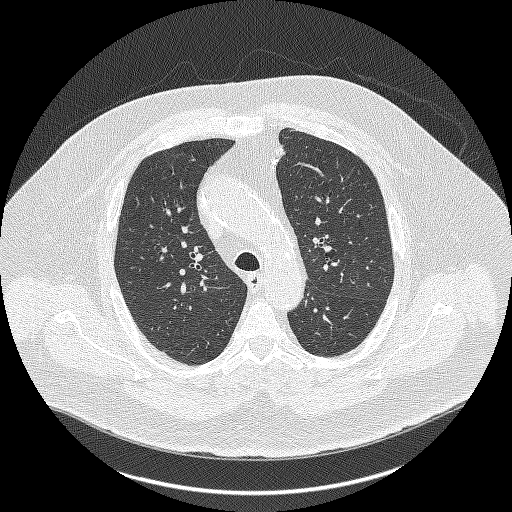
[im 261/338  lung]
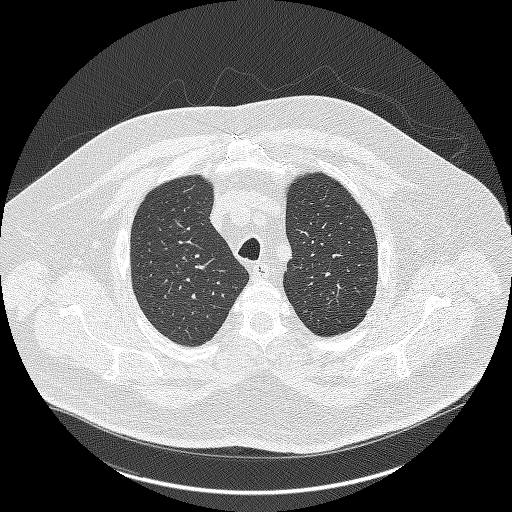
[im 292/338  lung]
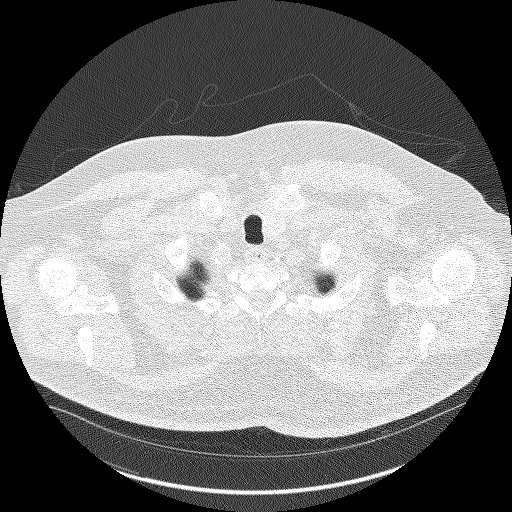
[im 322/338  lung]
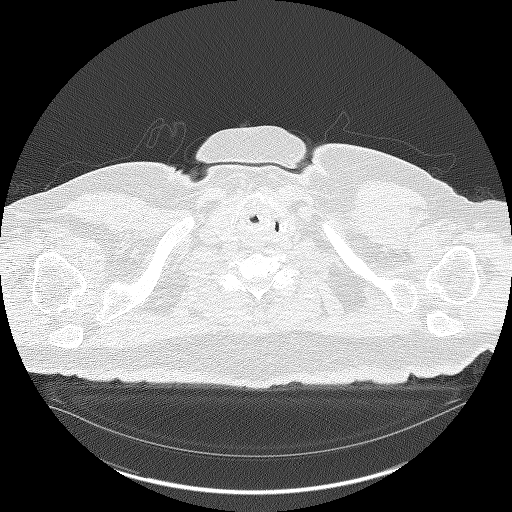

[Series 4: coronals lung 1.00 cor · coronal · 0.66mm/px · 3 of 371 slices shown]
[im 75/371  lung]
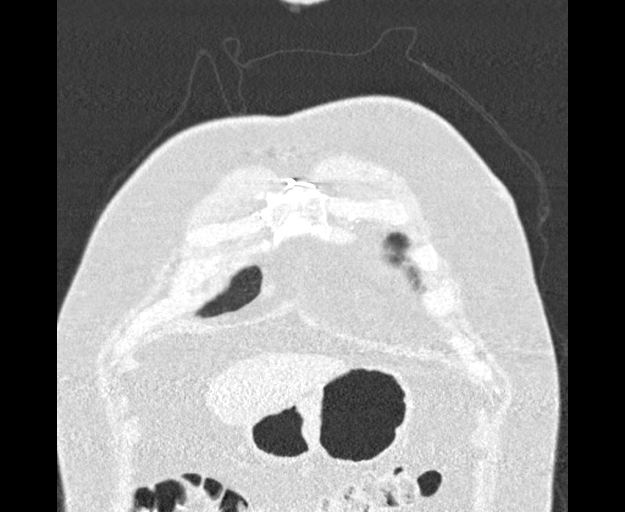
[im 149/371  lung]
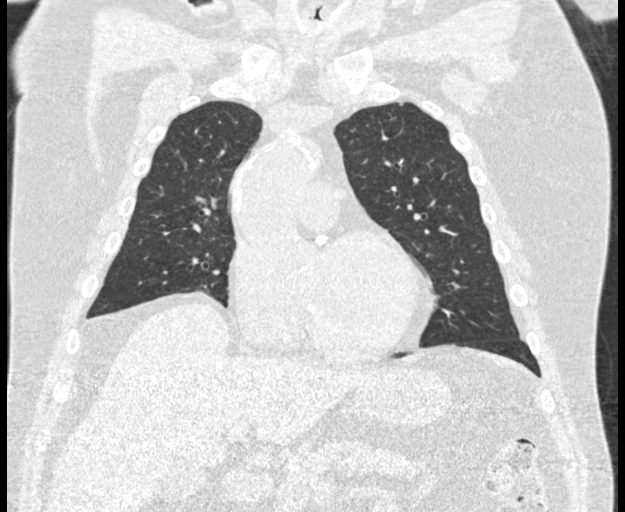
[im 223/371  lung]
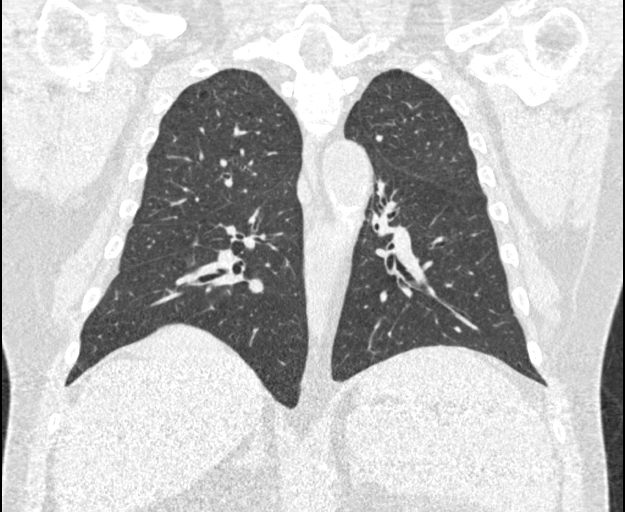

[15 of 40 positions shown; findings below may reference images not displayed]

FINDINGS: Cardiovascular: The heart is normal in size. No pericardial
effusion.

No evidence of thoracic aortic aneurysm. Atherosclerotic
calcifications of the aortic arch.

Three vessel coronary atherosclerosis.

Mediastinum/Nodes: No suspicious mediastinal lymphadenopathy.

Visualized thyroid is unremarkable.

Lungs/Pleura: Mild centrilobular emphysematous changes in the
bilateral upper lobes.

No focal consolidation.

Scattered calcified and noncalcified pulmonary nodules, including
peribronchovascular nodularity in the medial left lower lobe
measuring up to 10.4 mm, unchanged.

No pleural effusion or pneumothorax.

Upper Abdomen: Visualized upper abdomen is notable for a lobular
splenic contour with calcified hepatic and splenic granulomata.

Musculoskeletal: Mild degenerative changes of the visualized
thoracolumbar spine. Median sternotomy.
IMPRESSION: Lung-RADS 2, benign appearance or behavior. Continue annual
screening with low-dose chest CT without contrast in 12 months.

Aortic Atherosclerosis (ZXHIY-TYY.Y) and Emphysema (ZXHIY-ACF.X).

## 2022-05-13 IMAGING — CR DG SACRUM/COCCYX 2+V
3 series · 3 of 3 positions shown · non-contrast
Comparison: 12/20/2011

CLINICAL DATA: Fall several months ago injuring tailbone.
Persistent pain.

EXAM:
SACRUM AND COCCYX - 2+ VIEW

[coccyx ap]
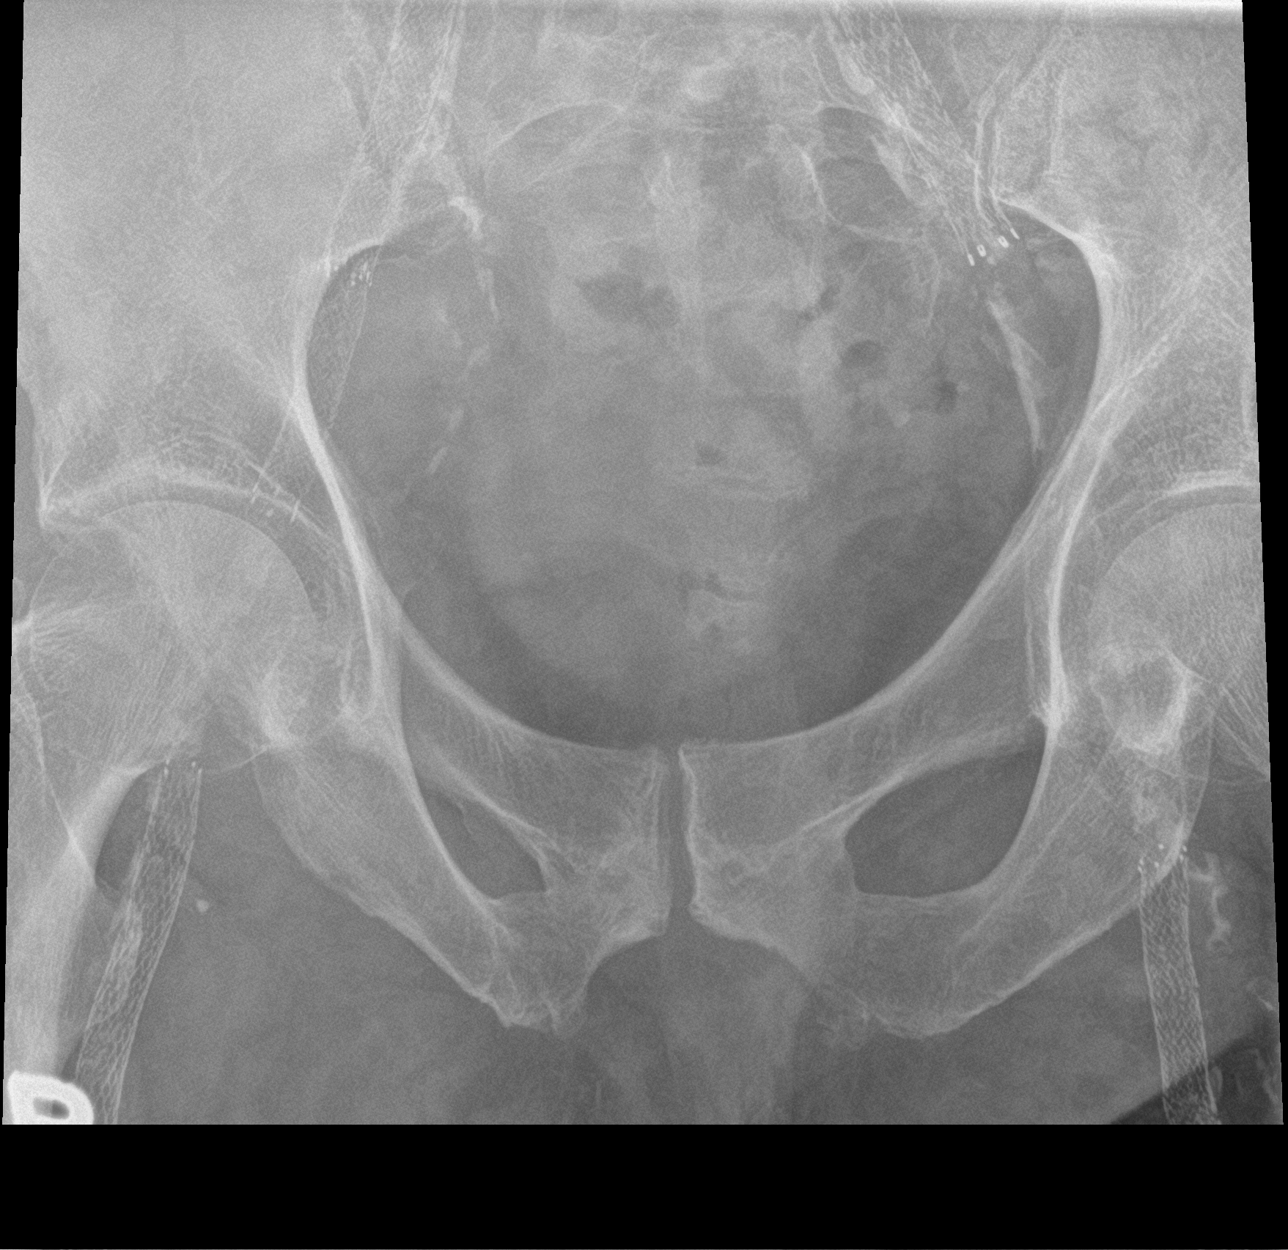

[sacrum ap]
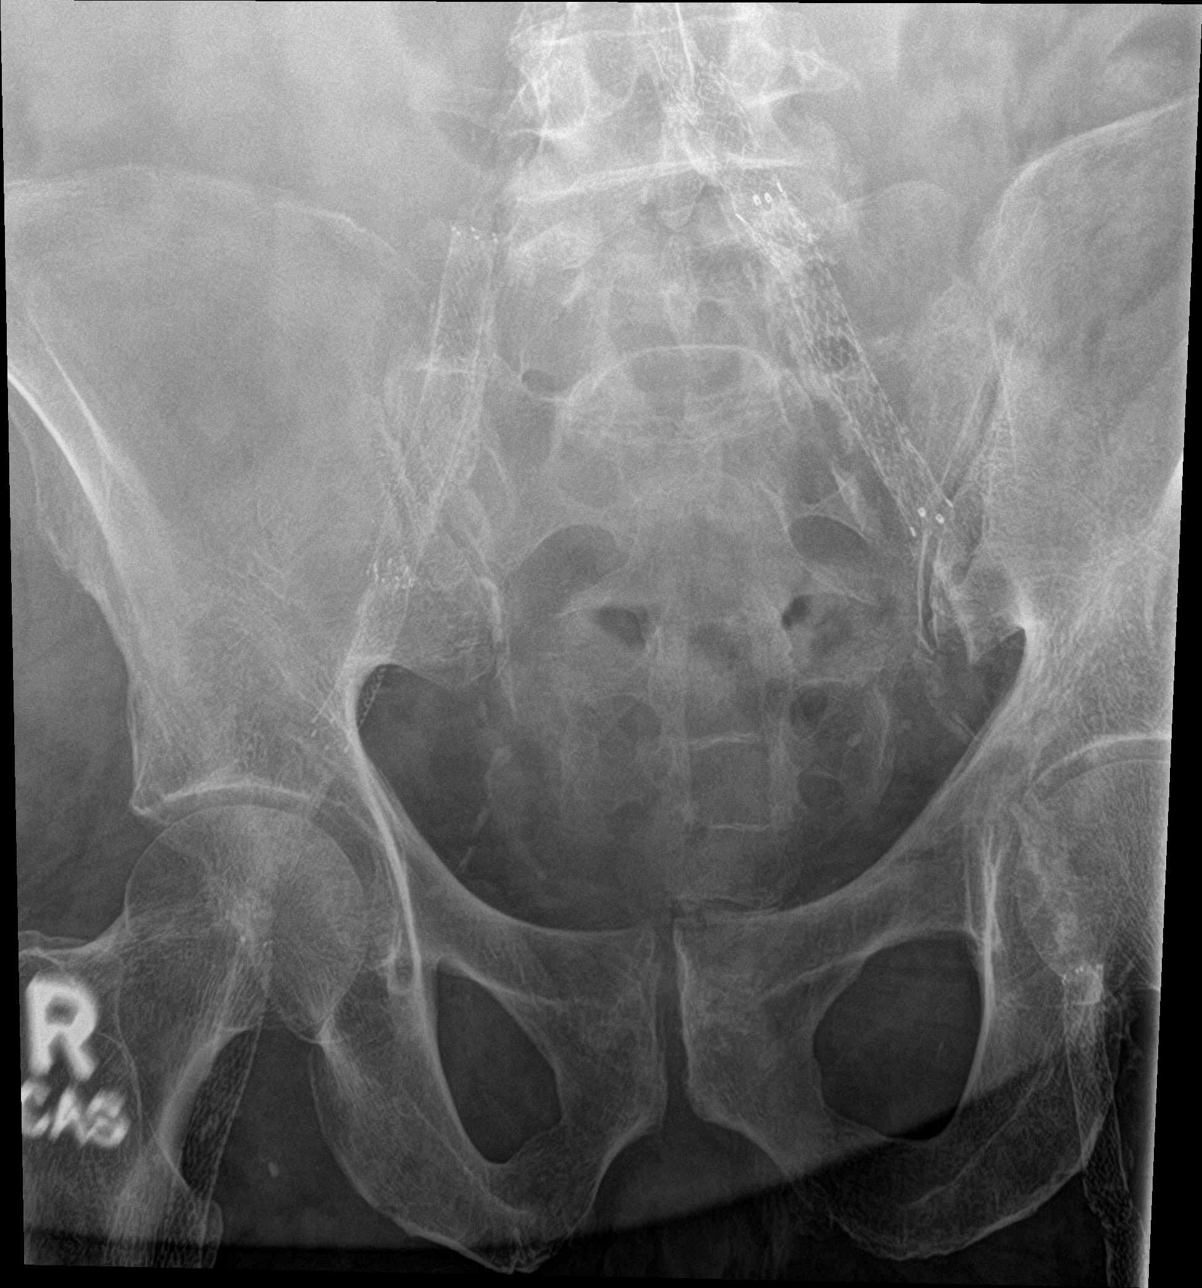

[sacrum lat]
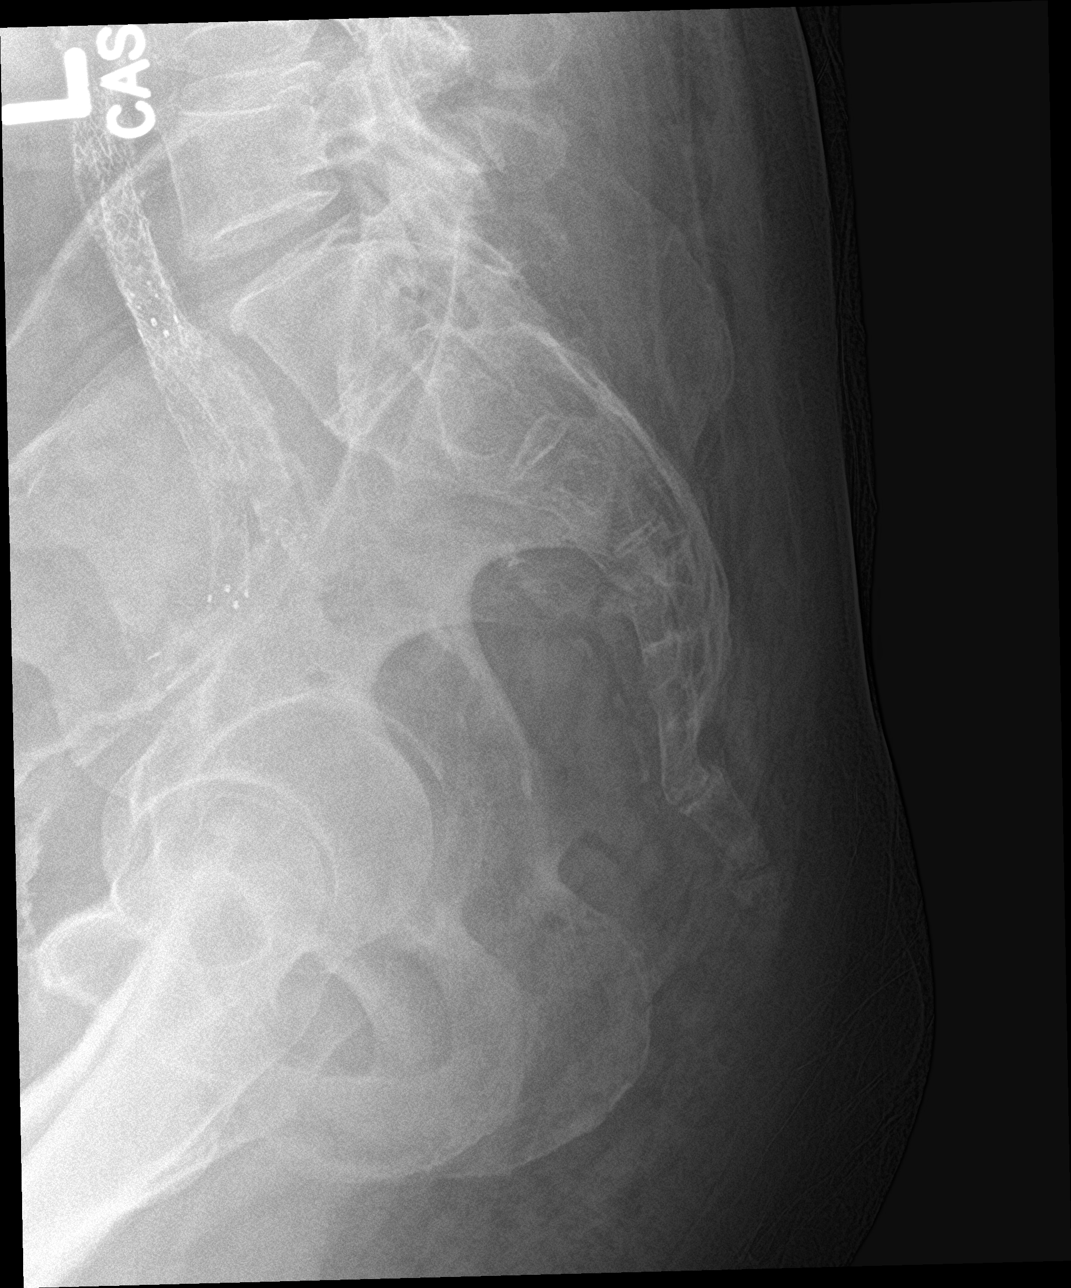

[3 of 3 positions shown; findings below may reference images not displayed]

FINDINGS: No evidence of acute fracture. Mild posterior angulation of the
distal aspect of the coccyx unchanged. There are degenerative
changes of the spine. Stents over the iliac and femoral arteries
bilaterally.
IMPRESSION: No acute findings.

## 2022-05-13 IMAGING — CR DG CHEST 2V
1 series · 2 of 2 positions shown · non-contrast
Comparison: CT dated July 01, 2019.

CLINICAL DATA: Chest pain.

EXAM:
CHEST - 2 VIEW

[Series 1: dg chest 2 view · 0.14mm/px · 2 of 2 slices shown]
[im 1/2]
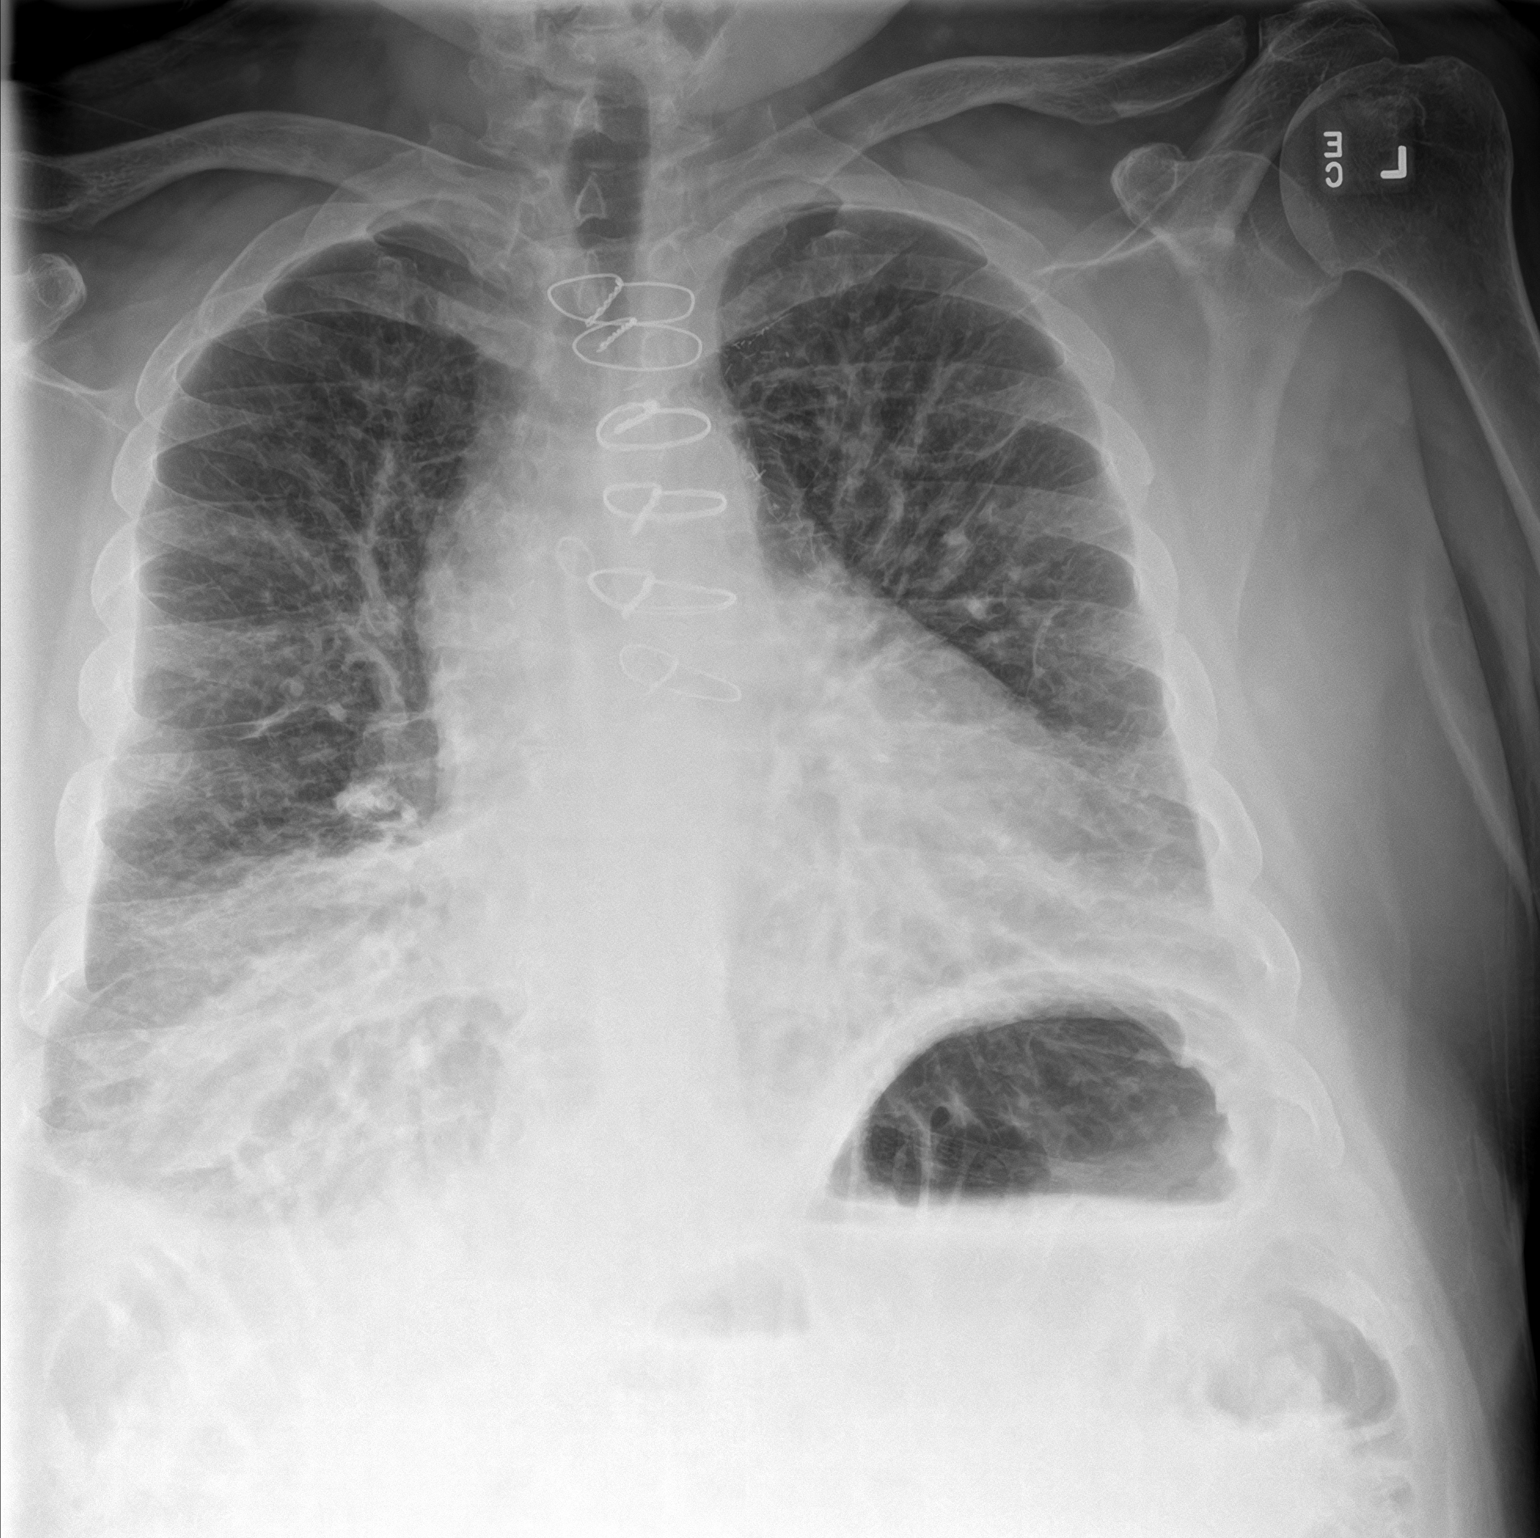
[im 2/2]
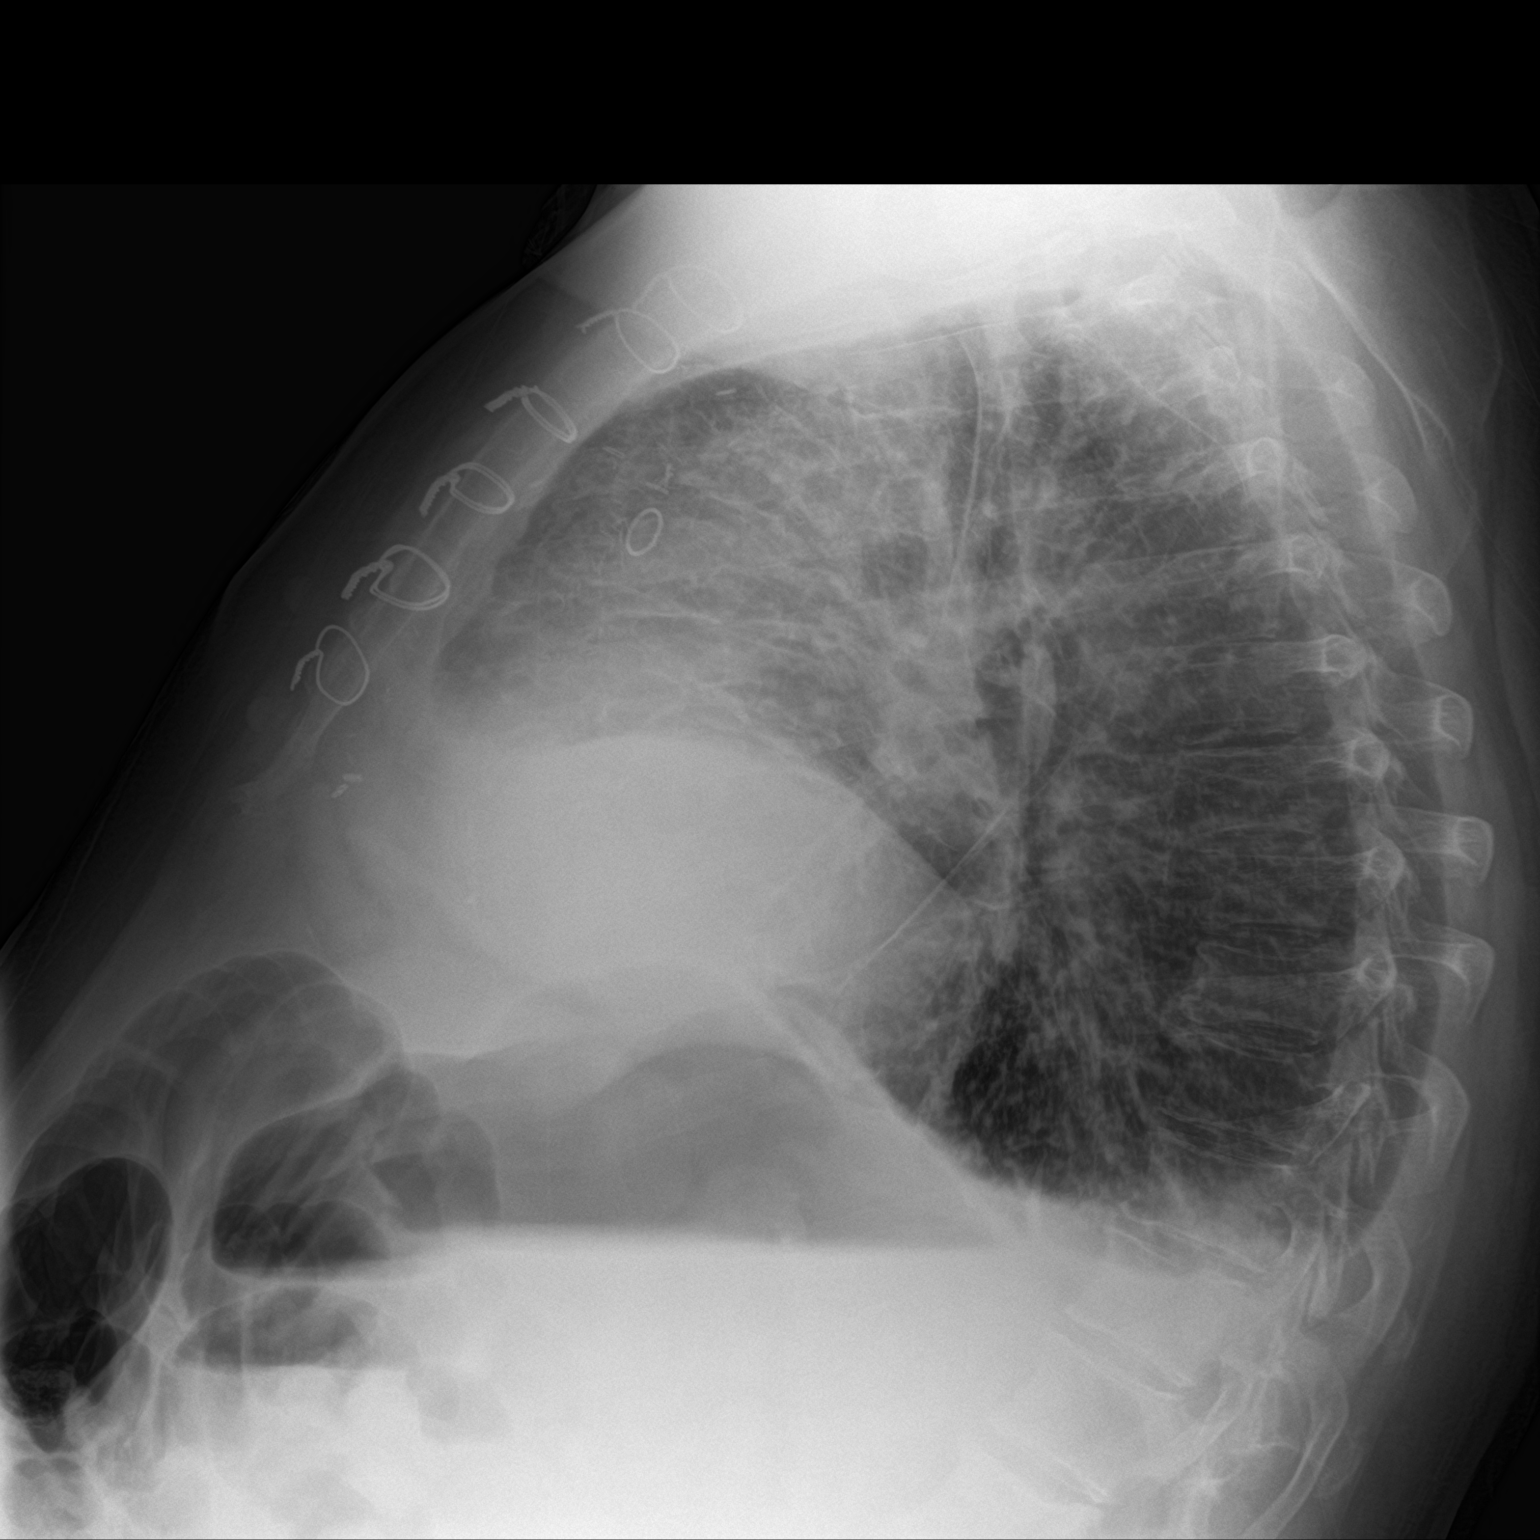

[2 of 2 positions shown; findings below may reference images not displayed]

FINDINGS: There are coarse interstitial lung markings bilaterally with hazy
bibasilar airspace opacities which appear to be new since 8351 chest
x-ray and the patient's recent CT. The heart size is mildly enlarged
but stable from prior study. The patient is status post prior CABG.
There is no pneumothorax. There are dilated loops of small bowel in
the upper abdomen. There are small bilateral pleural effusions.
IMPRESSION: 1. Prominent interstitial lung markings with new small bilateral
pleural effusions raises concern for developing congestive heart
failure. An underlying atypical infectious process is not excluded.
2. Dilated loops of bowel in the upper abdomen consider a dedicated
abdominal radiograph for further evaluation.
